# Patient Record
Sex: Female | Born: 1940 | Race: White | Hispanic: No | Marital: Married | Smoking: Former smoker
Health system: Southern US, Community
[De-identification: ages and names within clinical notes are randomized; demographics above are authoritative.]

## PROBLEM LIST (undated history)

## (undated) DIAGNOSIS — Z8719 Personal history of other diseases of the digestive system: Secondary | ICD-10-CM

## (undated) DIAGNOSIS — I5032 Chronic diastolic (congestive) heart failure: Secondary | ICD-10-CM

## (undated) DIAGNOSIS — M810 Age-related osteoporosis without current pathological fracture: Secondary | ICD-10-CM

## (undated) DIAGNOSIS — I10 Essential (primary) hypertension: Secondary | ICD-10-CM

## (undated) DIAGNOSIS — M542 Cervicalgia: Secondary | ICD-10-CM

## (undated) DIAGNOSIS — I350 Nonrheumatic aortic (valve) stenosis: Secondary | ICD-10-CM

## (undated) DIAGNOSIS — F419 Anxiety disorder, unspecified: Secondary | ICD-10-CM

## (undated) DIAGNOSIS — J42 Unspecified chronic bronchitis: Secondary | ICD-10-CM

## (undated) DIAGNOSIS — R413 Other amnesia: Secondary | ICD-10-CM

## (undated) DIAGNOSIS — G4733 Obstructive sleep apnea (adult) (pediatric): Secondary | ICD-10-CM

## (undated) DIAGNOSIS — R296 Repeated falls: Secondary | ICD-10-CM

## (undated) DIAGNOSIS — R002 Palpitations: Secondary | ICD-10-CM

## (undated) DIAGNOSIS — G252 Other specified forms of tremor: Secondary | ICD-10-CM

## (undated) DIAGNOSIS — N390 Urinary tract infection, site not specified: Secondary | ICD-10-CM

## (undated) DIAGNOSIS — D126 Benign neoplasm of colon, unspecified: Secondary | ICD-10-CM

## (undated) DIAGNOSIS — M1990 Unspecified osteoarthritis, unspecified site: Secondary | ICD-10-CM

## (undated) DIAGNOSIS — K766 Portal hypertension: Secondary | ICD-10-CM

## (undated) DIAGNOSIS — K449 Diaphragmatic hernia without obstruction or gangrene: Secondary | ICD-10-CM

## (undated) DIAGNOSIS — E785 Hyperlipidemia, unspecified: Secondary | ICD-10-CM

## (undated) DIAGNOSIS — J4489 Other specified chronic obstructive pulmonary disease: Secondary | ICD-10-CM

## (undated) DIAGNOSIS — K7581 Nonalcoholic steatohepatitis (NASH): Secondary | ICD-10-CM

## (undated) DIAGNOSIS — I2510 Atherosclerotic heart disease of native coronary artery without angina pectoris: Secondary | ICD-10-CM

## (undated) DIAGNOSIS — F209 Schizophrenia, unspecified: Secondary | ICD-10-CM

## (undated) DIAGNOSIS — K759 Inflammatory liver disease, unspecified: Secondary | ICD-10-CM

## (undated) DIAGNOSIS — K5730 Diverticulosis of large intestine without perforation or abscess without bleeding: Secondary | ICD-10-CM

## (undated) DIAGNOSIS — K3189 Other diseases of stomach and duodenum: Secondary | ICD-10-CM

## (undated) DIAGNOSIS — G5603 Carpal tunnel syndrome, bilateral upper limbs: Secondary | ICD-10-CM

## (undated) DIAGNOSIS — E46 Unspecified protein-calorie malnutrition: Secondary | ICD-10-CM

## (undated) DIAGNOSIS — K219 Gastro-esophageal reflux disease without esophagitis: Secondary | ICD-10-CM

## (undated) DIAGNOSIS — R0789 Other chest pain: Secondary | ICD-10-CM

## (undated) DIAGNOSIS — M549 Dorsalgia, unspecified: Secondary | ICD-10-CM

## (undated) DIAGNOSIS — Z9981 Dependence on supplemental oxygen: Secondary | ICD-10-CM

## (undated) DIAGNOSIS — E119 Type 2 diabetes mellitus without complications: Secondary | ICD-10-CM

## (undated) DIAGNOSIS — F333 Major depressive disorder, recurrent, severe with psychotic symptoms: Secondary | ICD-10-CM

## (undated) DIAGNOSIS — G2581 Restless legs syndrome: Secondary | ICD-10-CM

## (undated) DIAGNOSIS — D696 Thrombocytopenia, unspecified: Secondary | ICD-10-CM

## (undated) DIAGNOSIS — K31819 Angiodysplasia of stomach and duodenum without bleeding: Secondary | ICD-10-CM

## (undated) DIAGNOSIS — K7460 Unspecified cirrhosis of liver: Secondary | ICD-10-CM

## (undated) DIAGNOSIS — D500 Iron deficiency anemia secondary to blood loss (chronic): Secondary | ICD-10-CM

## (undated) DIAGNOSIS — J189 Pneumonia, unspecified organism: Secondary | ICD-10-CM

## (undated) DIAGNOSIS — J449 Chronic obstructive pulmonary disease, unspecified: Secondary | ICD-10-CM

## (undated) HISTORY — DX: Angiodysplasia of stomach and duodenum without bleeding: K31.819

## (undated) HISTORY — DX: Unspecified cirrhosis of liver: K74.60

## (undated) HISTORY — DX: Urinary tract infection, site not specified: N39.0

## (undated) HISTORY — DX: Major depressive disorder, recurrent, severe with psychotic symptoms: F33.3

## (undated) HISTORY — DX: Other amnesia: R41.3

## (undated) HISTORY — DX: Palpitations: R00.2

## (undated) HISTORY — PX: HEMORROIDECTOMY: SUR656

## (undated) HISTORY — DX: Restless legs syndrome: G25.81

## (undated) HISTORY — DX: Diverticulosis of large intestine without perforation or abscess without bleeding: K57.30

## (undated) HISTORY — DX: Dorsalgia, unspecified: M54.9

## (undated) HISTORY — DX: Repeated falls: R29.6

## (undated) HISTORY — PX: BREAST BIOPSY: SHX20

## (undated) HISTORY — DX: Unspecified protein-calorie malnutrition: E46

## (undated) HISTORY — DX: Iron deficiency anemia secondary to blood loss (chronic): D50.0

## (undated) HISTORY — DX: Diaphragmatic hernia without obstruction or gangrene: K44.9

## (undated) HISTORY — DX: Benign neoplasm of colon, unspecified: D12.6

## (undated) HISTORY — DX: Nonalcoholic steatohepatitis (NASH): K75.81

## (undated) HISTORY — DX: Other specified chronic obstructive pulmonary disease: J44.89

## (undated) HISTORY — DX: Gastro-esophageal reflux disease without esophagitis: K21.9

## (undated) HISTORY — DX: Atherosclerotic heart disease of native coronary artery without angina pectoris: I25.10

## (undated) HISTORY — DX: Chronic obstructive pulmonary disease, unspecified: J44.9

## (undated) HISTORY — DX: Cervicalgia: M54.2

## (undated) HISTORY — DX: Age-related osteoporosis without current pathological fracture: M81.0

## (undated) HISTORY — DX: Unspecified osteoarthritis, unspecified site: M19.90

## (undated) HISTORY — DX: Other diseases of stomach and duodenum: K31.89

## (undated) HISTORY — DX: Portal hypertension: K76.6

## (undated) HISTORY — DX: Morbid (severe) obesity due to excess calories: E66.01

## (undated) HISTORY — PX: VAGINAL HYSTERECTOMY: SUR661

## (undated) HISTORY — PX: APPENDECTOMY: SHX54

## (undated) HISTORY — DX: Essential (primary) hypertension: I10

## (undated) HISTORY — DX: Unspecified chronic bronchitis: J42

---

## 1999-03-26 ENCOUNTER — Emergency Department (HOSPITAL_COMMUNITY): Admission: EM | Admit: 1999-03-26 | Discharge: 1999-03-26 | Payer: Self-pay | Admitting: Emergency Medicine

## 1999-04-19 ENCOUNTER — Inpatient Hospital Stay (HOSPITAL_COMMUNITY): Admission: EM | Admit: 1999-04-19 | Discharge: 1999-04-24 | Payer: Self-pay | Admitting: Emergency Medicine

## 1999-04-25 ENCOUNTER — Other Ambulatory Visit: Admission: RE | Admit: 1999-04-25 | Discharge: 1999-05-03 | Payer: Self-pay

## 1999-05-08 ENCOUNTER — Encounter (HOSPITAL_COMMUNITY): Admission: RE | Admit: 1999-05-08 | Discharge: 1999-08-06 | Payer: Self-pay | Admitting: Pulmonary Disease

## 1999-10-18 ENCOUNTER — Other Ambulatory Visit: Admission: RE | Admit: 1999-10-18 | Discharge: 1999-10-18 | Payer: Self-pay | Admitting: *Deleted

## 1999-12-11 ENCOUNTER — Encounter (INDEPENDENT_AMBULATORY_CARE_PROVIDER_SITE_OTHER): Payer: Self-pay | Admitting: Specialist

## 1999-12-11 ENCOUNTER — Inpatient Hospital Stay (HOSPITAL_COMMUNITY): Admission: EM | Admit: 1999-12-11 | Discharge: 1999-12-15 | Payer: Self-pay | Admitting: Gastroenterology

## 1999-12-12 ENCOUNTER — Encounter: Payer: Self-pay | Admitting: Gastroenterology

## 2000-01-16 ENCOUNTER — Encounter (INDEPENDENT_AMBULATORY_CARE_PROVIDER_SITE_OTHER): Payer: Self-pay | Admitting: Specialist

## 2000-01-16 ENCOUNTER — Ambulatory Visit (HOSPITAL_COMMUNITY): Admission: RE | Admit: 2000-01-16 | Discharge: 2000-01-16 | Payer: Self-pay | Admitting: Internal Medicine

## 2000-07-22 ENCOUNTER — Ambulatory Visit (HOSPITAL_COMMUNITY): Admission: RE | Admit: 2000-07-22 | Discharge: 2000-07-22 | Payer: Self-pay | Admitting: Internal Medicine

## 2000-07-22 ENCOUNTER — Encounter: Payer: Self-pay | Admitting: Internal Medicine

## 2000-08-01 ENCOUNTER — Ambulatory Visit (HOSPITAL_COMMUNITY): Admission: RE | Admit: 2000-08-01 | Discharge: 2000-08-01 | Payer: Self-pay | Admitting: Cardiology

## 2000-08-12 ENCOUNTER — Emergency Department (HOSPITAL_COMMUNITY): Admission: EM | Admit: 2000-08-12 | Discharge: 2000-08-13 | Payer: Self-pay | Admitting: Emergency Medicine

## 2000-08-12 ENCOUNTER — Encounter: Payer: Self-pay | Admitting: Emergency Medicine

## 2000-09-16 ENCOUNTER — Ambulatory Visit (HOSPITAL_COMMUNITY): Admission: RE | Admit: 2000-09-16 | Discharge: 2000-09-16 | Payer: Self-pay | Admitting: Pulmonary Disease

## 2000-09-16 ENCOUNTER — Encounter: Payer: Self-pay | Admitting: Pulmonary Disease

## 2000-09-18 ENCOUNTER — Ambulatory Visit: Admission: RE | Admit: 2000-09-18 | Discharge: 2000-09-18 | Payer: Self-pay | Admitting: Pulmonary Disease

## 2000-09-23 ENCOUNTER — Encounter (HOSPITAL_COMMUNITY): Admission: RE | Admit: 2000-09-23 | Discharge: 2000-12-22 | Payer: Self-pay | Admitting: Internal Medicine

## 2001-06-28 ENCOUNTER — Emergency Department (HOSPITAL_COMMUNITY): Admission: EM | Admit: 2001-06-28 | Discharge: 2001-06-28 | Payer: Self-pay

## 2001-08-16 ENCOUNTER — Encounter: Payer: Self-pay | Admitting: Emergency Medicine

## 2001-08-16 ENCOUNTER — Emergency Department (HOSPITAL_COMMUNITY): Admission: EM | Admit: 2001-08-16 | Discharge: 2001-08-16 | Payer: Self-pay | Admitting: Emergency Medicine

## 2001-10-27 ENCOUNTER — Encounter (INDEPENDENT_AMBULATORY_CARE_PROVIDER_SITE_OTHER): Payer: Self-pay | Admitting: *Deleted

## 2001-10-27 ENCOUNTER — Inpatient Hospital Stay (HOSPITAL_COMMUNITY): Admission: EM | Admit: 2001-10-27 | Discharge: 2001-11-01 | Payer: Self-pay | Admitting: Emergency Medicine

## 2001-10-27 ENCOUNTER — Encounter: Payer: Self-pay | Admitting: Emergency Medicine

## 2001-12-01 ENCOUNTER — Encounter: Payer: Self-pay | Admitting: Internal Medicine

## 2001-12-01 ENCOUNTER — Ambulatory Visit (HOSPITAL_COMMUNITY): Admission: RE | Admit: 2001-12-01 | Discharge: 2001-12-01 | Payer: Self-pay | Admitting: Internal Medicine

## 2001-12-01 ENCOUNTER — Encounter (INDEPENDENT_AMBULATORY_CARE_PROVIDER_SITE_OTHER): Payer: Self-pay | Admitting: Specialist

## 2001-12-08 ENCOUNTER — Ambulatory Visit (HOSPITAL_COMMUNITY): Admission: RE | Admit: 2001-12-08 | Discharge: 2001-12-08 | Payer: Self-pay | Admitting: Internal Medicine

## 2002-03-03 ENCOUNTER — Emergency Department (HOSPITAL_COMMUNITY): Admission: EM | Admit: 2002-03-03 | Discharge: 2002-03-03 | Payer: Self-pay | Admitting: Emergency Medicine

## 2002-03-03 ENCOUNTER — Ambulatory Visit (HOSPITAL_COMMUNITY): Admission: RE | Admit: 2002-03-03 | Discharge: 2002-03-03 | Payer: Self-pay | Admitting: Internal Medicine

## 2002-08-08 ENCOUNTER — Emergency Department (HOSPITAL_COMMUNITY): Admission: EM | Admit: 2002-08-08 | Discharge: 2002-08-09 | Payer: Self-pay

## 2002-08-09 ENCOUNTER — Encounter: Payer: Self-pay | Admitting: Emergency Medicine

## 2002-09-09 ENCOUNTER — Emergency Department (HOSPITAL_COMMUNITY): Admission: EM | Admit: 2002-09-09 | Discharge: 2002-09-10 | Payer: Self-pay | Admitting: Emergency Medicine

## 2003-07-27 ENCOUNTER — Encounter (HOSPITAL_COMMUNITY): Admission: RE | Admit: 2003-07-27 | Discharge: 2003-10-25 | Payer: Self-pay | Admitting: Internal Medicine

## 2003-11-08 ENCOUNTER — Emergency Department (HOSPITAL_COMMUNITY): Admission: EM | Admit: 2003-11-08 | Discharge: 2003-11-09 | Payer: Self-pay | Admitting: Emergency Medicine

## 2003-11-28 ENCOUNTER — Inpatient Hospital Stay (HOSPITAL_COMMUNITY): Admission: AD | Admit: 2003-11-28 | Discharge: 2003-12-05 | Payer: Self-pay | Admitting: Pulmonary Disease

## 2004-04-16 ENCOUNTER — Emergency Department (HOSPITAL_COMMUNITY): Admission: EM | Admit: 2004-04-16 | Discharge: 2004-04-17 | Payer: Self-pay | Admitting: Emergency Medicine

## 2004-04-19 ENCOUNTER — Other Ambulatory Visit (HOSPITAL_COMMUNITY): Admission: RE | Admit: 2004-04-19 | Discharge: 2004-07-18 | Payer: Self-pay | Admitting: Psychiatry

## 2004-04-25 ENCOUNTER — Encounter: Admission: RE | Admit: 2004-04-25 | Discharge: 2004-04-25 | Payer: Self-pay | Admitting: Internal Medicine

## 2004-05-04 ENCOUNTER — Ambulatory Visit (HOSPITAL_COMMUNITY): Payer: Self-pay | Admitting: Professional Counselor

## 2004-05-17 ENCOUNTER — Ambulatory Visit (HOSPITAL_COMMUNITY): Payer: Self-pay | Admitting: Professional Counselor

## 2004-05-30 ENCOUNTER — Ambulatory Visit (HOSPITAL_COMMUNITY): Payer: Self-pay | Admitting: Professional Counselor

## 2004-06-25 ENCOUNTER — Ambulatory Visit (HOSPITAL_COMMUNITY): Payer: Self-pay | Admitting: Professional Counselor

## 2004-06-26 ENCOUNTER — Ambulatory Visit: Payer: Self-pay | Admitting: Internal Medicine

## 2004-06-27 ENCOUNTER — Ambulatory Visit: Payer: Self-pay | Admitting: Pulmonary Disease

## 2004-07-04 ENCOUNTER — Ambulatory Visit: Payer: Self-pay | Admitting: Pulmonary Disease

## 2004-08-24 ENCOUNTER — Ambulatory Visit: Payer: Self-pay | Admitting: Internal Medicine

## 2004-10-19 ENCOUNTER — Ambulatory Visit: Payer: Self-pay | Admitting: Internal Medicine

## 2004-10-19 ENCOUNTER — Ambulatory Visit (HOSPITAL_COMMUNITY): Admission: RE | Admit: 2004-10-19 | Discharge: 2004-10-19 | Payer: Self-pay | Admitting: Internal Medicine

## 2004-11-05 ENCOUNTER — Ambulatory Visit: Payer: Self-pay | Admitting: Pulmonary Disease

## 2004-11-23 ENCOUNTER — Ambulatory Visit: Payer: Self-pay | Admitting: Pulmonary Disease

## 2005-01-08 ENCOUNTER — Ambulatory Visit: Payer: Self-pay | Admitting: Internal Medicine

## 2005-01-11 ENCOUNTER — Ambulatory Visit: Payer: Self-pay | Admitting: Cardiovascular Disease

## 2005-01-16 ENCOUNTER — Ambulatory Visit: Payer: Self-pay | Admitting: Pulmonary Disease

## 2005-02-04 ENCOUNTER — Ambulatory Visit: Payer: Self-pay | Admitting: Pulmonary Disease

## 2005-02-19 ENCOUNTER — Encounter (HOSPITAL_COMMUNITY): Admission: RE | Admit: 2005-02-19 | Discharge: 2005-02-21 | Payer: Self-pay | Admitting: Pulmonary Disease

## 2005-06-04 ENCOUNTER — Ambulatory Visit: Payer: Self-pay | Admitting: Pulmonary Disease

## 2005-06-13 ENCOUNTER — Ambulatory Visit: Payer: Self-pay | Admitting: Internal Medicine

## 2005-06-26 ENCOUNTER — Ambulatory Visit: Payer: Self-pay

## 2005-07-02 ENCOUNTER — Ambulatory Visit (HOSPITAL_COMMUNITY): Admission: RE | Admit: 2005-07-02 | Discharge: 2005-07-02 | Payer: Self-pay | Admitting: Internal Medicine

## 2005-07-12 ENCOUNTER — Emergency Department (HOSPITAL_COMMUNITY): Admission: EM | Admit: 2005-07-12 | Discharge: 2005-07-12 | Payer: Self-pay | Admitting: Emergency Medicine

## 2005-07-30 ENCOUNTER — Ambulatory Visit: Payer: Self-pay | Admitting: Internal Medicine

## 2005-09-05 ENCOUNTER — Ambulatory Visit: Payer: Self-pay | Admitting: Pulmonary Disease

## 2005-09-24 ENCOUNTER — Ambulatory Visit: Payer: Self-pay | Admitting: Pulmonary Disease

## 2005-09-24 ENCOUNTER — Inpatient Hospital Stay (HOSPITAL_COMMUNITY): Admission: AD | Admit: 2005-09-24 | Discharge: 2005-09-27 | Payer: Self-pay | Admitting: Pulmonary Disease

## 2005-10-01 ENCOUNTER — Ambulatory Visit: Payer: Self-pay | Admitting: Pulmonary Disease

## 2005-12-16 ENCOUNTER — Ambulatory Visit: Payer: Self-pay | Admitting: Pulmonary Disease

## 2005-12-24 ENCOUNTER — Ambulatory Visit: Payer: Self-pay | Admitting: Internal Medicine

## 2006-01-06 ENCOUNTER — Ambulatory Visit: Payer: Self-pay | Admitting: Internal Medicine

## 2006-03-25 ENCOUNTER — Ambulatory Visit: Payer: Self-pay | Admitting: Pulmonary Disease

## 2006-04-24 ENCOUNTER — Ambulatory Visit: Payer: Self-pay | Admitting: Internal Medicine

## 2006-05-02 ENCOUNTER — Encounter (HOSPITAL_COMMUNITY): Admission: RE | Admit: 2006-05-02 | Discharge: 2006-07-31 | Payer: Self-pay | Admitting: Internal Medicine

## 2006-06-23 ENCOUNTER — Ambulatory Visit: Payer: Self-pay | Admitting: Pulmonary Disease

## 2006-07-07 ENCOUNTER — Ambulatory Visit: Payer: Self-pay | Admitting: Internal Medicine

## 2006-09-04 ENCOUNTER — Ambulatory Visit: Payer: Self-pay | Admitting: Internal Medicine

## 2006-09-04 LAB — CONVERTED CEMR LAB
Albumin: 3.2 g/dL — ABNORMAL LOW (ref 3.5–5.2)
BUN: 11 mg/dL (ref 6–23)
CO2: 28 meq/L (ref 19–32)
Chloride: 101 meq/L (ref 96–112)
GFR calc non Af Amer: 77 mL/min
Glomerular Filtration Rate, Af Am: 93 mL/min/{1.73_m2}
HCT: 33.2 % — ABNORMAL LOW (ref 36.0–46.0)
MCHC: 33.6 g/dL (ref 30.0–36.0)
MCV: 97.3 fL (ref 78.0–100.0)
Platelets: 130 10*3/uL — ABNORMAL LOW (ref 150–400)
RDW: 14 % (ref 11.5–14.6)
Transferrin: 261.1 mg/dL (ref >=212.0)

## 2006-09-24 ENCOUNTER — Encounter: Admission: RE | Admit: 2006-09-24 | Discharge: 2006-12-23 | Payer: Self-pay | Admitting: Internal Medicine

## 2006-10-07 ENCOUNTER — Emergency Department (HOSPITAL_COMMUNITY): Admission: EM | Admit: 2006-10-07 | Discharge: 2006-10-07 | Payer: Self-pay | Admitting: Emergency Medicine

## 2006-10-15 ENCOUNTER — Ambulatory Visit: Payer: Self-pay | Admitting: Pulmonary Disease

## 2006-10-15 LAB — CONVERTED CEMR LAB
Albumin: 3.5 g/dL (ref 3.5–5.2)
BUN: 13 mg/dL (ref 6–23)
CO2: 29 meq/L (ref 19–32)
Creatinine, Ser: 0.6 mg/dL (ref 0.4–1.2)
Creatinine,U: 64.4 mg/dL
Eosinophils Absolute: 0.2 10*3/uL (ref 0.0–0.6)
GFR calc Af Amer: 130 mL/min
GFR calc non Af Amer: 107 mL/min
Iron: 89 ug/dL (ref 42–145)
Lymphocytes Relative: 18.3 % (ref 12.0–46.0)
MCHC: 34.2 g/dL (ref 30.0–36.0)
Microalb Creat Ratio: 4.7 mg/g (ref 0.0–30.0)
Monocytes Relative: 10.6 % (ref 3.0–11.0)
Phosphorus: 3.7 mg/dL (ref 2.3–4.6)
Platelets: 139 10*3/uL — ABNORMAL LOW (ref 150–400)
Potassium: 3.4 meq/L — ABNORMAL LOW (ref 3.5–5.1)
RBC: 3.71 M/uL — ABNORMAL LOW (ref 3.87–5.11)
Transferrin: 252.2 mg/dL (ref >=212.0)

## 2006-10-16 ENCOUNTER — Ambulatory Visit: Payer: Self-pay | Admitting: Internal Medicine

## 2006-10-19 ENCOUNTER — Encounter: Admission: RE | Admit: 2006-10-19 | Discharge: 2006-10-19 | Payer: Self-pay | Admitting: Pulmonary Disease

## 2006-11-12 ENCOUNTER — Ambulatory Visit: Payer: Self-pay | Admitting: Pulmonary Disease

## 2006-12-26 ENCOUNTER — Ambulatory Visit: Payer: Self-pay | Admitting: Pulmonary Disease

## 2007-01-01 ENCOUNTER — Ambulatory Visit: Payer: Self-pay | Admitting: Internal Medicine

## 2007-01-01 LAB — CONVERTED CEMR LAB
Basophils Relative: 0.4 % (ref 0.0–1.0)
Eosinophils Absolute: 0.2 10*3/uL (ref 0.0–0.6)
HCT: 35.2 % — ABNORMAL LOW (ref 36.0–46.0)
MCHC: 33.8 g/dL (ref 30.0–36.0)
Neutrophils Relative %: 72 % (ref 43.0–77.0)
Platelets: 131 10*3/uL — ABNORMAL LOW (ref 150–400)
WBC: 5.2 10*3/uL (ref 4.5–10.5)

## 2007-01-05 ENCOUNTER — Ambulatory Visit: Payer: Self-pay | Admitting: Internal Medicine

## 2007-02-26 ENCOUNTER — Ambulatory Visit: Payer: Self-pay | Admitting: Pulmonary Disease

## 2007-02-26 ENCOUNTER — Ambulatory Visit: Payer: Self-pay | Admitting: Internal Medicine

## 2007-02-26 LAB — CONVERTED CEMR LAB
ALT: 27 units/L (ref 0–35)
Albumin: 3.6 g/dL (ref 3.5–5.2)
Alkaline Phosphatase: 138 units/L — ABNORMAL HIGH (ref 39–117)
BUN: 13 mg/dL (ref 6–23)
Basophils Absolute: 0 10*3/uL (ref 0.0–0.1)
Basophils Relative: 0.4 % (ref 0.0–1.0)
Eosinophils Relative: 4.1 % (ref 0.0–5.0)
GFR calc non Af Amer: 90 mL/min
Glucose, Bld: 244 mg/dL — ABNORMAL HIGH (ref 70–99)
Lymphocytes Relative: 11.7 % — ABNORMAL LOW (ref 12.0–46.0)
Monocytes Relative: 6.8 % (ref 3.0–11.0)
Neutro Abs: 4.9 10*3/uL (ref 1.4–7.7)
Platelets: 151 10*3/uL (ref 150–400)
Potassium: 3.8 meq/L (ref 3.5–5.1)
Sodium: 139 meq/L (ref 135–145)
TSH: 1.04 microintl units/mL (ref 0.35–5.50)
Total Bilirubin: 1.2 mg/dL (ref 0.3–1.2)
Total Protein: 6.9 g/dL (ref 6.0–8.3)
Transferrin: 313 mg/dL (ref >=212.0)

## 2007-06-01 ENCOUNTER — Ambulatory Visit: Payer: Self-pay | Admitting: Pulmonary Disease

## 2007-06-01 LAB — CONVERTED CEMR LAB
ALT: 20 units/L (ref 0–35)
AST: 26 units/L (ref 0–37)
Alkaline Phosphatase: 106 units/L (ref 39–117)
BUN: 7 mg/dL (ref 6–23)
Bilirubin, Direct: 0.3 mg/dL (ref 0.0–0.3)
Creatinine, Ser: 0.7 mg/dL (ref 0.4–1.2)
GFR calc non Af Amer: 90 mL/min
Glucose, Bld: 198 mg/dL — ABNORMAL HIGH (ref 70–99)
HCT: 34.1 % — ABNORMAL LOW (ref 36.0–46.0)
Hemoglobin: 11.4 g/dL — ABNORMAL LOW (ref 12.0–15.0)
Lymphocytes Relative: 15.1 % (ref 12.0–46.0)
Monocytes Relative: 9.1 % (ref 3.0–11.0)
Neutro Abs: 3.8 10*3/uL (ref 1.4–7.7)
Potassium: 3.8 meq/L (ref 3.5–5.1)
RDW: 13.7 % (ref 11.5–14.6)
Sodium: 140 meq/L (ref 135–145)

## 2007-06-02 ENCOUNTER — Ambulatory Visit: Payer: Self-pay | Admitting: Cardiovascular Disease

## 2007-06-09 ENCOUNTER — Ambulatory Visit: Payer: Self-pay | Admitting: Internal Medicine

## 2007-06-09 ENCOUNTER — Encounter: Payer: Self-pay | Admitting: Internal Medicine

## 2007-06-12 ENCOUNTER — Ambulatory Visit: Payer: Self-pay | Admitting: Internal Medicine

## 2007-06-12 ENCOUNTER — Ambulatory Visit: Payer: Self-pay | Admitting: Pulmonary Disease

## 2007-06-12 LAB — CONVERTED CEMR LAB
Bilirubin Urine: NEGATIVE
Hemoglobin, Urine: NEGATIVE
RBC / HPF: NONE SEEN
Specific Gravity, Urine: 1.01 (ref 1.000–1.03)
Total Protein, Urine: NEGATIVE mg/dL

## 2007-06-25 DIAGNOSIS — J441 Chronic obstructive pulmonary disease with (acute) exacerbation: Secondary | ICD-10-CM | POA: Insufficient documentation

## 2007-06-25 DIAGNOSIS — M797 Fibromyalgia: Secondary | ICD-10-CM

## 2007-06-25 DIAGNOSIS — K5520 Angiodysplasia of colon without hemorrhage: Secondary | ICD-10-CM

## 2007-06-25 DIAGNOSIS — I10 Essential (primary) hypertension: Secondary | ICD-10-CM | POA: Insufficient documentation

## 2007-06-25 DIAGNOSIS — K219 Gastro-esophageal reflux disease without esophagitis: Secondary | ICD-10-CM | POA: Insufficient documentation

## 2007-06-26 ENCOUNTER — Ambulatory Visit: Payer: Self-pay | Admitting: Internal Medicine

## 2007-07-01 ENCOUNTER — Ambulatory Visit: Payer: Self-pay | Admitting: Pulmonary Disease

## 2007-07-01 LAB — CONVERTED CEMR LAB
Crystals: NEGATIVE
Hemoglobin, Urine: NEGATIVE
Ketones, ur: NEGATIVE mg/dL
Mucus, UA: NEGATIVE
Urobilinogen, UA: 1 (ref 0.0–1.0)
pH: 6 (ref 5.0–8.0)

## 2007-07-14 ENCOUNTER — Telehealth (INDEPENDENT_AMBULATORY_CARE_PROVIDER_SITE_OTHER): Payer: Self-pay | Admitting: *Deleted

## 2007-07-17 ENCOUNTER — Ambulatory Visit: Payer: Self-pay | Admitting: Pulmonary Disease

## 2007-08-24 ENCOUNTER — Telehealth (INDEPENDENT_AMBULATORY_CARE_PROVIDER_SITE_OTHER): Payer: Self-pay | Admitting: *Deleted

## 2007-08-28 ENCOUNTER — Ambulatory Visit: Payer: Self-pay | Admitting: Pulmonary Disease

## 2007-08-28 DIAGNOSIS — I359 Nonrheumatic aortic valve disorder, unspecified: Secondary | ICD-10-CM | POA: Insufficient documentation

## 2007-08-28 DIAGNOSIS — M1990 Unspecified osteoarthritis, unspecified site: Secondary | ICD-10-CM

## 2007-08-28 DIAGNOSIS — D500 Iron deficiency anemia secondary to blood loss (chronic): Secondary | ICD-10-CM

## 2007-08-28 DIAGNOSIS — K754 Autoimmune hepatitis: Secondary | ICD-10-CM

## 2007-08-28 DIAGNOSIS — M810 Age-related osteoporosis without current pathological fracture: Secondary | ICD-10-CM

## 2007-08-28 LAB — CONVERTED CEMR LAB
ALT: 18 units/L (ref 0–35)
AST: 23 units/L (ref 0–37)
Basophils Relative: 0 % (ref 0.0–1.0)
Bilirubin, Direct: 0.4 mg/dL — ABNORMAL HIGH (ref 0.0–0.3)
CO2: 30 meq/L (ref 19–32)
Calcium: 9.2 mg/dL (ref 8.4–10.5)
Creatinine, Ser: 0.7 mg/dL (ref 0.4–1.2)
Eosinophils Absolute: 0.2 10*3/uL (ref 0.0–0.6)
Eosinophils Relative: 2.9 % (ref 0.0–5.0)
Glucose, Bld: 119 mg/dL — ABNORMAL HIGH (ref 70–99)
Hgb A1c MFr Bld: 6 % (ref 4.6–6.0)
Monocytes Absolute: 0.4 10*3/uL (ref 0.2–0.7)
Monocytes Relative: 6.8 % (ref 3.0–11.0)
Neutro Abs: 4.3 10*3/uL (ref 1.4–7.7)
Neutrophils Relative %: 78.6 % — ABNORMAL HIGH (ref 43.0–77.0)
RBC: 3.33 M/uL — ABNORMAL LOW (ref 3.87–5.11)
Sodium: 140 meq/L (ref 135–145)
Total Protein: 7 g/dL (ref 6.0–8.3)

## 2007-09-04 ENCOUNTER — Encounter: Payer: Self-pay | Admitting: Pulmonary Disease

## 2007-09-21 ENCOUNTER — Ambulatory Visit: Payer: Self-pay

## 2007-09-21 ENCOUNTER — Encounter: Payer: Self-pay | Admitting: Pulmonary Disease

## 2007-10-02 ENCOUNTER — Telehealth: Payer: Self-pay | Admitting: Pulmonary Disease

## 2007-10-08 ENCOUNTER — Emergency Department (HOSPITAL_COMMUNITY): Admission: EM | Admit: 2007-10-08 | Discharge: 2007-10-08 | Payer: Self-pay | Admitting: Family Medicine

## 2007-10-15 ENCOUNTER — Telehealth (INDEPENDENT_AMBULATORY_CARE_PROVIDER_SITE_OTHER): Payer: Self-pay | Admitting: *Deleted

## 2007-10-19 ENCOUNTER — Telehealth (INDEPENDENT_AMBULATORY_CARE_PROVIDER_SITE_OTHER): Payer: Self-pay | Admitting: *Deleted

## 2007-10-24 ENCOUNTER — Encounter: Payer: Self-pay | Admitting: Pulmonary Disease

## 2007-10-29 ENCOUNTER — Ambulatory Visit: Payer: Self-pay | Admitting: Pulmonary Disease

## 2007-11-09 ENCOUNTER — Ambulatory Visit: Payer: Self-pay | Admitting: Pulmonary Disease

## 2007-11-09 DIAGNOSIS — F333 Major depressive disorder, recurrent, severe with psychotic symptoms: Secondary | ICD-10-CM

## 2007-11-14 LAB — CONVERTED CEMR LAB: Vit D, 1,25-Dihydroxy: 15 — ABNORMAL LOW (ref 30–89)

## 2007-11-17 ENCOUNTER — Telehealth (INDEPENDENT_AMBULATORY_CARE_PROVIDER_SITE_OTHER): Payer: Self-pay | Admitting: *Deleted

## 2007-11-18 ENCOUNTER — Telehealth (INDEPENDENT_AMBULATORY_CARE_PROVIDER_SITE_OTHER): Payer: Self-pay | Admitting: *Deleted

## 2007-11-18 ENCOUNTER — Ambulatory Visit: Payer: Self-pay | Admitting: Pulmonary Disease

## 2007-11-18 LAB — CONVERTED CEMR LAB
Albumin ELP: 55.3 % — ABNORMAL LOW (ref 55.8–66.1)
Alpha-1-Globulin: 4.8 % (ref 2.9–4.9)
Alpha-2-Globulin: 9.6 % (ref 7.1–11.8)
Beta Globulin: 7.9 % — ABNORMAL HIGH (ref 4.7–7.2)
Gamma Globulin: 15.1 % (ref 11.1–18.8)
IgG (Immunoglobin G), Serum: 1070 mg/dL (ref 694–1618)
Total Protein, Serum Electrophoresis: 6.8 g/dL (ref 6.0–8.3)

## 2007-11-19 ENCOUNTER — Encounter: Payer: Self-pay | Admitting: Pulmonary Disease

## 2007-11-19 LAB — CONVERTED CEMR LAB
Basophils Absolute: 0 10*3/uL (ref 0.0–0.1)
HCT: 27 % — ABNORMAL LOW (ref 36.0–46.0)
Hemoglobin: 8.8 g/dL — ABNORMAL LOW (ref 12.0–15.0)
MCHC: 32.7 g/dL (ref 30.0–36.0)
RDW: 15.6 % — ABNORMAL HIGH (ref 11.5–14.6)
Saturation Ratios: 10.5 % — ABNORMAL LOW (ref 20.0–50.0)
Vitamin B-12: 429 pg/mL (ref 211–911)

## 2007-11-23 ENCOUNTER — Ambulatory Visit (HOSPITAL_COMMUNITY): Admission: RE | Admit: 2007-11-23 | Discharge: 2007-11-23 | Payer: Self-pay | Admitting: Pulmonary Disease

## 2007-11-26 ENCOUNTER — Ambulatory Visit: Payer: Self-pay | Admitting: Pulmonary Disease

## 2007-11-26 LAB — CONVERTED CEMR LAB
Fecal Occult Blood: NEGATIVE
OCCULT 1: NEGATIVE
OCCULT 3: NEGATIVE

## 2007-11-27 ENCOUNTER — Inpatient Hospital Stay (HOSPITAL_COMMUNITY): Admission: EM | Admit: 2007-11-27 | Discharge: 2007-12-02 | Payer: Self-pay | Admitting: Emergency Medicine

## 2007-11-27 ENCOUNTER — Ambulatory Visit: Payer: Self-pay | Admitting: Internal Medicine

## 2007-11-27 ENCOUNTER — Ambulatory Visit: Payer: Self-pay | Admitting: Pulmonary Disease

## 2007-12-02 ENCOUNTER — Encounter: Payer: Self-pay | Admitting: Pulmonary Disease

## 2007-12-14 ENCOUNTER — Ambulatory Visit: Payer: Self-pay | Admitting: Pulmonary Disease

## 2007-12-14 LAB — CONVERTED CEMR LAB
Basophils Absolute: 0.1 10*3/uL (ref 0.0–0.1)
Eosinophils Absolute: 0.2 10*3/uL (ref 0.0–0.7)
HCT: 29 % — ABNORMAL LOW (ref 36.0–46.0)
Hemoglobin: 9.7 g/dL — ABNORMAL LOW (ref 12.0–15.0)
Iron: 75 ug/dL (ref 42–145)
Lymphocytes Relative: 13.1 % (ref 12.0–46.0)
Monocytes Relative: 8.5 % (ref 3.0–12.0)
Platelets: 138 10*3/uL — ABNORMAL LOW (ref 150–400)
RBC: 2.93 M/uL — ABNORMAL LOW (ref 3.87–5.11)
RDW: 17.7 % — ABNORMAL HIGH (ref 11.5–14.6)
Saturation Ratios: 21.4 % (ref 20.0–50.0)

## 2007-12-21 ENCOUNTER — Ambulatory Visit: Payer: Self-pay | Admitting: Pulmonary Disease

## 2007-12-21 ENCOUNTER — Encounter: Payer: Self-pay | Admitting: Adult Health

## 2007-12-21 ENCOUNTER — Telehealth: Payer: Self-pay | Admitting: Pulmonary Disease

## 2007-12-21 LAB — CONVERTED CEMR LAB
Crystals: NEGATIVE
Hemoglobin, Urine: NEGATIVE
Nitrite: NEGATIVE
RBC / HPF: NONE SEEN
Total Protein, Urine: NEGATIVE mg/dL
pH: 6 (ref 5.0–8.0)

## 2007-12-23 ENCOUNTER — Encounter: Payer: Self-pay | Admitting: Pulmonary Disease

## 2007-12-24 ENCOUNTER — Encounter: Payer: Self-pay | Admitting: Pulmonary Disease

## 2007-12-25 ENCOUNTER — Ambulatory Visit: Payer: Self-pay | Admitting: Pulmonary Disease

## 2007-12-25 LAB — CONVERTED CEMR LAB
HCT: 28.8 % — ABNORMAL LOW (ref 36.0–46.0)
Hemoglobin: 9.4 g/dL — ABNORMAL LOW (ref 12.0–15.0)
Iron: 60 ug/dL (ref 42–145)
MCHC: 32.7 g/dL (ref 30.0–36.0)
Monocytes Absolute: 0.3 10*3/uL (ref 0.1–1.0)
Neutrophils Relative %: 73.2 % (ref 43.0–77.0)
RBC: 2.88 M/uL — ABNORMAL LOW (ref 3.87–5.11)
Transferrin: 254.5 mg/dL (ref >=212.0)
WBC: 3.5 10*3/uL — ABNORMAL LOW (ref 4.5–10.5)

## 2008-01-05 ENCOUNTER — Telehealth (INDEPENDENT_AMBULATORY_CARE_PROVIDER_SITE_OTHER): Payer: Self-pay | Admitting: *Deleted

## 2008-01-11 ENCOUNTER — Telehealth (INDEPENDENT_AMBULATORY_CARE_PROVIDER_SITE_OTHER): Payer: Self-pay | Admitting: *Deleted

## 2008-01-11 ENCOUNTER — Encounter: Payer: Self-pay | Admitting: Emergency Medicine

## 2008-01-12 ENCOUNTER — Inpatient Hospital Stay (HOSPITAL_COMMUNITY): Admission: EM | Admit: 2008-01-12 | Discharge: 2008-01-14 | Payer: Self-pay | Admitting: *Deleted

## 2008-01-12 ENCOUNTER — Ambulatory Visit: Payer: Self-pay | Admitting: *Deleted

## 2008-01-19 ENCOUNTER — Encounter: Payer: Self-pay | Admitting: Pulmonary Disease

## 2008-01-19 ENCOUNTER — Telehealth: Payer: Self-pay | Admitting: Pulmonary Disease

## 2008-02-12 ENCOUNTER — Encounter: Payer: Self-pay | Admitting: Adult Health

## 2008-02-12 ENCOUNTER — Ambulatory Visit: Payer: Self-pay | Admitting: Internal Medicine

## 2008-02-12 DIAGNOSIS — I2510 Atherosclerotic heart disease of native coronary artery without angina pectoris: Secondary | ICD-10-CM | POA: Insufficient documentation

## 2008-02-16 ENCOUNTER — Ambulatory Visit: Payer: Self-pay | Admitting: Pulmonary Disease

## 2008-02-16 ENCOUNTER — Telehealth (INDEPENDENT_AMBULATORY_CARE_PROVIDER_SITE_OTHER): Payer: Self-pay | Admitting: *Deleted

## 2008-02-17 ENCOUNTER — Ambulatory Visit: Payer: Self-pay | Admitting: Pulmonary Disease

## 2008-02-17 ENCOUNTER — Telehealth (INDEPENDENT_AMBULATORY_CARE_PROVIDER_SITE_OTHER): Payer: Self-pay | Admitting: *Deleted

## 2008-02-21 LAB — CONVERTED CEMR LAB
Bilirubin Urine: NEGATIVE
Mucus, UA: NEGATIVE
Nitrite: NEGATIVE
Specific Gravity, Urine: 1.01 (ref 1.000–1.03)
pH: 5.5 (ref 5.0–8.0)

## 2008-02-22 ENCOUNTER — Ambulatory Visit: Payer: Self-pay | Admitting: Cardiovascular Disease

## 2008-02-22 LAB — CONVERTED CEMR LAB
ALT: 17 units/L (ref 0–35)
Albumin: 3.5 g/dL (ref 3.5–5.2)
Alkaline Phosphatase: 90 units/L (ref 39–117)
BUN: 20 mg/dL (ref 6–23)
Bilirubin, Direct: 0.3 mg/dL (ref 0.0–0.3)
Cholesterol: 107 mg/dL (ref 0–200)
Creatinine, Ser: 0.9 mg/dL (ref 0.4–1.2)
Glucose, Bld: 176 mg/dL — ABNORMAL HIGH (ref 70–99)
HDL: 34.5 mg/dL — ABNORMAL LOW (ref >39.0)
LDL Cholesterol: 59 mg/dL (ref 0–99)
Potassium: 4.2 meq/L (ref 3.5–5.1)
Sodium: 140 meq/L (ref 135–145)
Total Protein: 7.1 g/dL (ref 6.0–8.3)
VLDL: 14 mg/dL (ref 0–40)

## 2008-02-23 ENCOUNTER — Ambulatory Visit: Payer: Self-pay | Admitting: Pulmonary Disease

## 2008-02-23 DIAGNOSIS — N3090 Cystitis, unspecified without hematuria: Secondary | ICD-10-CM | POA: Insufficient documentation

## 2008-02-23 DIAGNOSIS — G2581 Restless legs syndrome: Secondary | ICD-10-CM | POA: Insufficient documentation

## 2008-03-01 ENCOUNTER — Telehealth (INDEPENDENT_AMBULATORY_CARE_PROVIDER_SITE_OTHER): Payer: Self-pay | Admitting: *Deleted

## 2008-03-03 ENCOUNTER — Telehealth (INDEPENDENT_AMBULATORY_CARE_PROVIDER_SITE_OTHER): Payer: Self-pay | Admitting: *Deleted

## 2008-03-03 ENCOUNTER — Encounter: Payer: Self-pay | Admitting: Pulmonary Disease

## 2008-03-04 ENCOUNTER — Encounter: Payer: Self-pay | Admitting: Internal Medicine

## 2008-03-14 ENCOUNTER — Telehealth (INDEPENDENT_AMBULATORY_CARE_PROVIDER_SITE_OTHER): Payer: Self-pay | Admitting: *Deleted

## 2008-03-19 ENCOUNTER — Emergency Department (HOSPITAL_COMMUNITY): Admission: EM | Admit: 2008-03-19 | Discharge: 2008-03-20 | Payer: Self-pay | Admitting: Emergency Medicine

## 2008-04-04 ENCOUNTER — Telehealth (INDEPENDENT_AMBULATORY_CARE_PROVIDER_SITE_OTHER): Payer: Self-pay | Admitting: *Deleted

## 2008-04-13 ENCOUNTER — Emergency Department (HOSPITAL_COMMUNITY): Admission: EM | Admit: 2008-04-13 | Discharge: 2008-04-13 | Payer: Self-pay | Admitting: Emergency Medicine

## 2008-04-18 ENCOUNTER — Other Ambulatory Visit: Admission: RE | Admit: 2008-04-18 | Discharge: 2008-04-18 | Payer: Self-pay | Admitting: Obstetrics and Gynecology

## 2008-04-18 ENCOUNTER — Ambulatory Visit: Payer: Self-pay | Admitting: Internal Medicine

## 2008-04-18 ENCOUNTER — Telehealth (INDEPENDENT_AMBULATORY_CARE_PROVIDER_SITE_OTHER): Payer: Self-pay | Admitting: *Deleted

## 2008-04-18 ENCOUNTER — Telehealth: Payer: Self-pay | Admitting: Internal Medicine

## 2008-04-19 LAB — CONVERTED CEMR LAB
Basophils Relative: 0.3 % (ref 0.0–3.0)
Hemoglobin: 10.4 g/dL — ABNORMAL LOW (ref 12.0–15.0)
INR: 1.1 — ABNORMAL HIGH (ref 0.8–1.0)
Lymphocytes Relative: 10.8 % — ABNORMAL LOW (ref 12.0–46.0)
Monocytes Relative: 6.9 % (ref 3.0–12.0)
Neutro Abs: 3.5 10*3/uL (ref 1.4–7.7)
Neutrophils Relative %: 78.9 % — ABNORMAL HIGH (ref 43.0–77.0)
Prothrombin Time: 12.6 s (ref 10.9–13.3)
RBC: 3.14 M/uL — ABNORMAL LOW (ref 3.87–5.11)
Saturation Ratios: 13.3 % — ABNORMAL LOW (ref 20.0–50.0)
WBC: 4.4 10*3/uL — ABNORMAL LOW (ref 4.5–10.5)
aPTT: 31.7 s — ABNORMAL HIGH (ref 21.7–29.8)

## 2008-04-26 ENCOUNTER — Ambulatory Visit (HOSPITAL_COMMUNITY): Admission: RE | Admit: 2008-04-26 | Discharge: 2008-04-26 | Payer: Self-pay | Admitting: Internal Medicine

## 2008-04-28 ENCOUNTER — Ambulatory Visit: Payer: Self-pay | Admitting: Internal Medicine

## 2008-05-01 ENCOUNTER — Encounter: Payer: Self-pay | Admitting: Internal Medicine

## 2008-05-10 ENCOUNTER — Telehealth (INDEPENDENT_AMBULATORY_CARE_PROVIDER_SITE_OTHER): Payer: Self-pay | Admitting: *Deleted

## 2008-05-10 ENCOUNTER — Encounter: Payer: Self-pay | Admitting: Pulmonary Disease

## 2008-05-18 ENCOUNTER — Telehealth: Payer: Self-pay | Admitting: Pulmonary Disease

## 2008-05-19 ENCOUNTER — Ambulatory Visit: Payer: Self-pay | Admitting: Pulmonary Disease

## 2008-05-19 LAB — CONVERTED CEMR LAB
ALT: 26 units/L (ref 0–35)
AST: 35 units/L (ref 0–37)
Albumin: 3.6 g/dL (ref 3.5–5.2)
Alkaline Phosphatase: 106 units/L (ref 39–117)
BUN: 16 mg/dL (ref 6–23)
Bilirubin, Direct: 0.4 mg/dL — ABNORMAL HIGH (ref 0.0–0.3)
CO2: 25 meq/L (ref 19–32)
Eosinophils Relative: 2.5 % (ref 0.0–5.0)
GFR calc Af Amer: 81 mL/min
Glucose, Bld: 236 mg/dL — ABNORMAL HIGH (ref 70–99)
HCT: 32.7 % — ABNORMAL LOW (ref 36.0–46.0)
Hemoglobin: 11.3 g/dL — ABNORMAL LOW (ref 12.0–15.0)
Lymphocytes Relative: 11.1 % — ABNORMAL LOW (ref 12.0–46.0)
Monocytes Absolute: 0.6 10*3/uL (ref 0.1–1.0)
Monocytes Relative: 9.9 % (ref 3.0–12.0)
Platelets: 118 10*3/uL — ABNORMAL LOW (ref 150–400)
Potassium: 4.2 meq/L (ref 3.5–5.1)
RDW: 15.5 % — ABNORMAL HIGH (ref 11.5–14.6)
Sodium: 138 meq/L (ref 135–145)
Total Protein: 7.4 g/dL (ref 6.0–8.3)
WBC: 5.9 10*3/uL (ref 4.5–10.5)

## 2008-05-23 ENCOUNTER — Ambulatory Visit: Payer: Self-pay

## 2008-05-23 ENCOUNTER — Ambulatory Visit: Payer: Self-pay | Admitting: Cardiovascular Disease

## 2008-05-26 ENCOUNTER — Encounter: Payer: Self-pay | Admitting: Pulmonary Disease

## 2008-06-01 ENCOUNTER — Inpatient Hospital Stay (HOSPITAL_COMMUNITY): Admission: AD | Admit: 2008-06-01 | Discharge: 2008-06-05 | Payer: Self-pay | Admitting: Psychiatry

## 2008-06-01 ENCOUNTER — Ambulatory Visit: Payer: Self-pay | Admitting: Psychiatry

## 2008-06-08 ENCOUNTER — Telehealth: Payer: Self-pay | Admitting: Internal Medicine

## 2008-06-09 ENCOUNTER — Ambulatory Visit: Payer: Self-pay | Admitting: Internal Medicine

## 2008-06-09 LAB — CONVERTED CEMR LAB: AFP-Tumor Marker: 2.2 ng/mL (ref 0.0–8.0)

## 2008-06-10 LAB — CONVERTED CEMR LAB
Basophils Absolute: 0 10*3/uL (ref 0.0–0.1)
Basophils Relative: 0.2 % (ref 0.0–3.0)
Eosinophils Absolute: 0.1 10*3/uL (ref 0.0–0.7)
HCT: 33.8 % — ABNORMAL LOW (ref 36.0–46.0)
Hemoglobin: 11.1 g/dL — ABNORMAL LOW (ref 12.0–15.0)
Iron: 57 ug/dL (ref 42–145)
Lymphocytes Relative: 10.9 % — ABNORMAL LOW (ref 12.0–46.0)
MCHC: 32.8 g/dL (ref 30.0–36.0)
MCV: 100.5 fL — ABNORMAL HIGH (ref 78.0–100.0)
Monocytes Absolute: 0.4 10*3/uL (ref 0.1–1.0)
Neutro Abs: 4.1 10*3/uL (ref 1.4–7.7)
RBC: 3.36 M/uL — ABNORMAL LOW (ref 3.87–5.11)
RDW: 15.2 % — ABNORMAL HIGH (ref 11.5–14.6)

## 2008-06-14 ENCOUNTER — Encounter: Payer: Self-pay | Admitting: Internal Medicine

## 2008-06-17 ENCOUNTER — Telehealth (INDEPENDENT_AMBULATORY_CARE_PROVIDER_SITE_OTHER): Payer: Self-pay | Admitting: *Deleted

## 2008-06-20 ENCOUNTER — Ambulatory Visit: Payer: Self-pay | Admitting: Pulmonary Disease

## 2008-07-19 ENCOUNTER — Ambulatory Visit: Payer: Self-pay | Admitting: Pulmonary Disease

## 2008-07-21 LAB — CONVERTED CEMR LAB
Albumin: 3.6 g/dL (ref 3.5–5.2)
BUN: 18 mg/dL (ref 6–23)
Calcium: 9.6 mg/dL (ref 8.4–10.5)
Eosinophils Absolute: 0.2 10*3/uL (ref 0.0–0.7)
GFR calc Af Amer: 81 mL/min
GFR calc non Af Amer: 67 mL/min
Glucose, Bld: 166 mg/dL — ABNORMAL HIGH (ref 70–99)
HCT: 35.2 % — ABNORMAL LOW (ref 36.0–46.0)
Hemoglobin: 12 g/dL (ref 12.0–15.0)
MCV: 99.6 fL (ref 78.0–100.0)
Monocytes Absolute: 0.5 10*3/uL (ref 0.1–1.0)
Monocytes Relative: 8.3 % (ref 3.0–12.0)
Neutro Abs: 4.4 10*3/uL (ref 1.4–7.7)
RDW: 13.2 % (ref 11.5–14.6)
Sodium: 141 meq/L (ref 135–145)
Total Protein: 7.1 g/dL (ref 6.0–8.3)

## 2008-07-29 ENCOUNTER — Ambulatory Visit: Payer: Self-pay | Admitting: Internal Medicine

## 2008-08-29 ENCOUNTER — Encounter: Payer: Self-pay | Admitting: Internal Medicine

## 2008-08-31 ENCOUNTER — Telehealth: Payer: Self-pay | Admitting: Pulmonary Disease

## 2008-09-06 ENCOUNTER — Ambulatory Visit: Payer: Self-pay | Admitting: Pulmonary Disease

## 2008-09-16 ENCOUNTER — Encounter: Payer: Self-pay | Admitting: Cardiovascular Disease

## 2008-09-16 ENCOUNTER — Ambulatory Visit: Payer: Self-pay

## 2008-09-16 ENCOUNTER — Ambulatory Visit: Payer: Self-pay | Admitting: Cardiovascular Disease

## 2008-09-16 DIAGNOSIS — R002 Palpitations: Secondary | ICD-10-CM | POA: Insufficient documentation

## 2008-10-06 ENCOUNTER — Encounter: Payer: Self-pay | Admitting: Pulmonary Disease

## 2008-10-11 ENCOUNTER — Telehealth: Payer: Self-pay | Admitting: Internal Medicine

## 2008-10-20 ENCOUNTER — Encounter: Payer: Self-pay | Admitting: Pulmonary Disease

## 2008-11-01 ENCOUNTER — Ambulatory Visit: Payer: Self-pay | Admitting: Pulmonary Disease

## 2008-11-01 LAB — CONVERTED CEMR LAB: Vit D, 25-Hydroxy: 42 ng/mL (ref 30–89)

## 2008-11-07 LAB — CONVERTED CEMR LAB
ALT: 17 units/L (ref 0–35)
Basophils Absolute: 0.1 10*3/uL (ref 0.0–0.1)
Bilirubin Urine: NEGATIVE
CO2: 28 meq/L (ref 19–32)
Calcium: 9.5 mg/dL (ref 8.4–10.5)
Cholesterol: 128 mg/dL (ref 0–200)
Creatinine, Ser: 0.7 mg/dL (ref 0.4–1.2)
Creatinine,U: 123.6 mg/dL
Eosinophils Absolute: 0.2 10*3/uL (ref 0.0–0.7)
HCT: 35.6 % — ABNORMAL LOW (ref 36.0–46.0)
HDL: 32.7 mg/dL — ABNORMAL LOW (ref >39.0)
Hemoglobin, Urine: NEGATIVE
Hemoglobin: 12.3 g/dL (ref 12.0–15.0)
MCHC: 34.6 g/dL (ref 30.0–36.0)
MCV: 93.4 fL (ref 78.0–100.0)
Microalb, Ur: 0.8 mg/dL (ref 0.0–1.9)
Monocytes Absolute: 0.5 10*3/uL (ref 0.1–1.0)
Neutro Abs: 3.7 10*3/uL (ref 1.4–7.7)
Nitrite: NEGATIVE
RDW: 13.5 % (ref 11.5–14.6)
Total Bilirubin: 1.1 mg/dL (ref 0.3–1.2)
Total Protein, Urine: NEGATIVE mg/dL
Total Protein: 6.9 g/dL (ref 6.0–8.3)
Transferrin: 301.4 mg/dL (ref 212.0–360.0)
Triglycerides: 125 mg/dL (ref 0–149)
Urobilinogen, UA: 1 (ref 0.0–1.0)

## 2008-12-05 ENCOUNTER — Encounter: Payer: Self-pay | Admitting: Pulmonary Disease

## 2008-12-05 ENCOUNTER — Telehealth (INDEPENDENT_AMBULATORY_CARE_PROVIDER_SITE_OTHER): Payer: Self-pay | Admitting: *Deleted

## 2009-01-26 ENCOUNTER — Encounter: Payer: Self-pay | Admitting: Pulmonary Disease

## 2009-02-01 ENCOUNTER — Ambulatory Visit: Payer: Self-pay | Admitting: Pulmonary Disease

## 2009-02-01 DIAGNOSIS — E7800 Pure hypercholesterolemia, unspecified: Secondary | ICD-10-CM

## 2009-02-06 LAB — CONVERTED CEMR LAB
Basophils Absolute: 0 10*3/uL (ref 0.0–0.1)
Basophils Relative: 0.1 % (ref 0.0–3.0)
CO2: 28 meq/L (ref 19–32)
Chloride: 107 meq/L (ref 96–112)
Hemoglobin: 11.5 g/dL — ABNORMAL LOW (ref 12.0–15.0)
Lymphocytes Relative: 15.1 % (ref 12.0–46.0)
Monocytes Relative: 9 % (ref 3.0–12.0)
Neutro Abs: 3.7 10*3/uL (ref 1.4–7.7)
Potassium: 3.5 meq/L (ref 3.5–5.1)
RBC: 3.54 M/uL — ABNORMAL LOW (ref 3.87–5.11)
Saturation Ratios: 17 % — ABNORMAL LOW (ref 20.0–50.0)
Sodium: 143 meq/L (ref 135–145)
Transferrin: 306.6 mg/dL (ref 212.0–360.0)

## 2009-02-20 ENCOUNTER — Telehealth (INDEPENDENT_AMBULATORY_CARE_PROVIDER_SITE_OTHER): Payer: Self-pay | Admitting: *Deleted

## 2009-03-07 ENCOUNTER — Telehealth: Payer: Self-pay | Admitting: Pulmonary Disease

## 2009-03-28 ENCOUNTER — Telehealth (INDEPENDENT_AMBULATORY_CARE_PROVIDER_SITE_OTHER): Payer: Self-pay | Admitting: *Deleted

## 2009-05-02 ENCOUNTER — Telehealth (INDEPENDENT_AMBULATORY_CARE_PROVIDER_SITE_OTHER): Payer: Self-pay | Admitting: *Deleted

## 2009-05-10 ENCOUNTER — Ambulatory Visit: Payer: Self-pay | Admitting: Pulmonary Disease

## 2009-05-11 ENCOUNTER — Telehealth: Payer: Self-pay | Admitting: Pulmonary Disease

## 2009-05-31 ENCOUNTER — Telehealth (INDEPENDENT_AMBULATORY_CARE_PROVIDER_SITE_OTHER): Payer: Self-pay | Admitting: *Deleted

## 2009-06-19 ENCOUNTER — Encounter: Payer: Self-pay | Admitting: Pulmonary Disease

## 2009-06-22 ENCOUNTER — Ambulatory Visit: Payer: Self-pay | Admitting: Pulmonary Disease

## 2009-07-04 ENCOUNTER — Telehealth (INDEPENDENT_AMBULATORY_CARE_PROVIDER_SITE_OTHER): Payer: Self-pay | Admitting: *Deleted

## 2009-07-25 ENCOUNTER — Telehealth (INDEPENDENT_AMBULATORY_CARE_PROVIDER_SITE_OTHER): Payer: Self-pay | Admitting: *Deleted

## 2009-08-24 ENCOUNTER — Ambulatory Visit: Payer: Self-pay | Admitting: Pulmonary Disease

## 2009-08-25 LAB — CONVERTED CEMR LAB
AST: 23 units/L (ref 0–37)
Albumin: 3.3 g/dL — ABNORMAL LOW (ref 3.5–5.2)
BUN: 11 mg/dL (ref 6–23)
Basophils Absolute: 0 10*3/uL (ref 0.0–0.1)
Basophils Relative: 0.7 % (ref 0.0–3.0)
Calcium: 9.2 mg/dL (ref 8.4–10.5)
Cholesterol: 98 mg/dL (ref 0–200)
Creatinine, Ser: 0.8 mg/dL (ref 0.4–1.2)
Creatinine,U: 85.3 mg/dL
GFR calc non Af Amer: 76.25 mL/min (ref >60)
Glucose, Bld: 149 mg/dL — ABNORMAL HIGH (ref 70–99)
HCT: 22.1 % — CL (ref 36.0–46.0)
HDL: 37.4 mg/dL — ABNORMAL LOW (ref >39.00)
Hemoglobin: 7.2 g/dL — CL (ref 12.0–15.0)
Hgb A1c MFr Bld: 5.8 % (ref 4.6–6.5)
LDL Cholesterol: 48 mg/dL (ref 0–99)
Lymphs Abs: 0.6 10*3/uL — ABNORMAL LOW (ref 0.7–4.0)
MCHC: 32.6 g/dL (ref 30.0–36.0)
Microalb Creat Ratio: 16.4 mg/g (ref 0.0–30.0)
Microalb, Ur: 1.4 mg/dL (ref 0.0–1.9)
Monocytes Relative: 7.4 % (ref 3.0–12.0)
Neutro Abs: 4.1 10*3/uL (ref 1.4–7.7)
Potassium: 4 meq/L (ref 3.5–5.1)
RBC: 2.68 M/uL — ABNORMAL LOW (ref 3.87–5.11)
RDW: 18.4 % — ABNORMAL HIGH (ref 11.5–14.6)
TSH: 2.51 microintl units/mL (ref 0.35–5.50)
Total Bilirubin: 1 mg/dL (ref 0.3–1.2)
VLDL: 12.8 mg/dL (ref 0.0–40.0)

## 2009-08-28 ENCOUNTER — Telehealth: Payer: Self-pay | Admitting: Internal Medicine

## 2009-08-30 ENCOUNTER — Ambulatory Visit: Payer: Self-pay | Admitting: Internal Medicine

## 2009-08-31 ENCOUNTER — Ambulatory Visit (HOSPITAL_COMMUNITY): Admission: RE | Admit: 2009-08-31 | Discharge: 2009-08-31 | Payer: Self-pay | Admitting: Vascular Surgery

## 2009-09-04 ENCOUNTER — Ambulatory Visit (HOSPITAL_COMMUNITY): Admission: RE | Admit: 2009-09-04 | Discharge: 2009-09-04 | Payer: Self-pay | Admitting: Pulmonary Disease

## 2009-09-06 DIAGNOSIS — I472 Ventricular tachycardia: Secondary | ICD-10-CM | POA: Insufficient documentation

## 2009-09-18 ENCOUNTER — Ambulatory Visit: Payer: Self-pay | Admitting: Cardiovascular Disease

## 2009-09-27 ENCOUNTER — Ambulatory Visit (HOSPITAL_COMMUNITY)
Admission: RE | Admit: 2009-09-27 | Discharge: 2009-09-27 | Payer: Self-pay | Source: Home / Self Care | Admitting: Internal Medicine

## 2009-09-27 ENCOUNTER — Encounter: Payer: Self-pay | Admitting: Pulmonary Disease

## 2009-09-27 ENCOUNTER — Ambulatory Visit: Payer: Self-pay | Admitting: Internal Medicine

## 2009-10-02 ENCOUNTER — Telehealth: Payer: Self-pay | Admitting: Internal Medicine

## 2009-10-02 ENCOUNTER — Encounter: Payer: Self-pay | Admitting: Pulmonary Disease

## 2009-10-11 ENCOUNTER — Ambulatory Visit: Payer: Self-pay | Admitting: Pulmonary Disease

## 2009-10-11 LAB — CONVERTED CEMR LAB
Basophils Absolute: 0 10*3/uL (ref 0.0–0.1)
Eosinophils Absolute: 0.2 10*3/uL (ref 0.0–0.7)
Iron: 95 ug/dL (ref 42–145)
Lymphocytes Relative: 11 % — ABNORMAL LOW (ref 12.0–46.0)
MCHC: 34.1 g/dL (ref 30.0–36.0)
Monocytes Relative: 7.1 % (ref 3.0–12.0)
Neutro Abs: 4.8 10*3/uL (ref 1.4–7.7)
Neutrophils Relative %: 79.1 % — ABNORMAL HIGH (ref 43.0–77.0)
Platelets: 111 10*3/uL — ABNORMAL LOW (ref 150.0–400.0)
RDW: 19.7 % — ABNORMAL HIGH (ref 11.5–14.6)
Saturation Ratios: 27.9 % (ref 20.0–50.0)

## 2009-10-30 ENCOUNTER — Telehealth (INDEPENDENT_AMBULATORY_CARE_PROVIDER_SITE_OTHER): Payer: Self-pay | Admitting: *Deleted

## 2009-10-30 ENCOUNTER — Ambulatory Visit: Payer: Self-pay | Admitting: Internal Medicine

## 2009-11-01 LAB — CONVERTED CEMR LAB
Basophils Absolute: 0 10*3/uL (ref 0.0–0.1)
Eosinophils Relative: 3.3 % (ref 0.0–5.0)
HCT: 32.2 % — ABNORMAL LOW (ref 36.0–46.0)
Lymphs Abs: 0.6 10*3/uL — ABNORMAL LOW (ref 0.7–4.0)
MCV: 102.1 fL — ABNORMAL HIGH (ref 78.0–100.0)
Monocytes Absolute: 0.4 10*3/uL (ref 0.1–1.0)
Monocytes Relative: 9 % (ref 3.0–12.0)
Neutrophils Relative %: 72 % (ref 43.0–77.0)
Platelets: 87 10*3/uL — ABNORMAL LOW (ref 150.0–400.0)
RDW: 15.2 % — ABNORMAL HIGH (ref 11.5–14.6)
WBC: 4 10*3/uL — ABNORMAL LOW (ref 4.5–10.5)

## 2009-11-27 ENCOUNTER — Telehealth (INDEPENDENT_AMBULATORY_CARE_PROVIDER_SITE_OTHER): Payer: Self-pay | Admitting: *Deleted

## 2009-11-30 ENCOUNTER — Ambulatory Visit: Payer: Self-pay | Admitting: Internal Medicine

## 2009-11-30 LAB — CONVERTED CEMR LAB
Basophils Relative: 0.7 % (ref 0.0–3.0)
Eosinophils Relative: 4.1 % (ref 0.0–5.0)
MCV: 102.1 fL — ABNORMAL HIGH (ref 78.0–100.0)
Monocytes Relative: 9.9 % (ref 3.0–12.0)
Neutrophils Relative %: 68.8 % (ref 43.0–77.0)
Platelets: 93 10*3/uL — ABNORMAL LOW (ref 150.0–400.0)
RBC: 3.14 M/uL — ABNORMAL LOW (ref 3.87–5.11)
WBC: 4.2 10*3/uL — ABNORMAL LOW (ref 4.5–10.5)

## 2009-12-25 ENCOUNTER — Telehealth (INDEPENDENT_AMBULATORY_CARE_PROVIDER_SITE_OTHER): Payer: Self-pay | Admitting: *Deleted

## 2010-01-24 ENCOUNTER — Telehealth: Payer: Self-pay | Admitting: Pulmonary Disease

## 2010-01-29 ENCOUNTER — Telehealth (INDEPENDENT_AMBULATORY_CARE_PROVIDER_SITE_OTHER): Payer: Self-pay | Admitting: *Deleted

## 2010-02-02 ENCOUNTER — Telehealth: Payer: Self-pay | Admitting: Internal Medicine

## 2010-02-05 ENCOUNTER — Ambulatory Visit: Payer: Self-pay | Admitting: Internal Medicine

## 2010-02-05 LAB — CONVERTED CEMR LAB
ALT: 20 units/L (ref 0–35)
AST: 22 units/L (ref 0–37)
Alkaline Phosphatase: 92 units/L (ref 39–117)
Bilirubin, Direct: 0.2 mg/dL (ref 0.0–0.3)
CO2: 27 meq/L (ref 19–32)
Calcium: 9.4 mg/dL (ref 8.4–10.5)
Chloride: 100 meq/L (ref 96–112)
Glucose, Bld: 248 mg/dL — ABNORMAL HIGH (ref 70–99)
Hemoglobin: 11.1 g/dL — ABNORMAL LOW (ref 12.0–15.0)
Lymphocytes Relative: 9.5 % — ABNORMAL LOW (ref 12.0–46.0)
Monocytes Relative: 6.3 % (ref 3.0–12.0)
Neutro Abs: 4.9 10*3/uL (ref 1.4–7.7)
Neutrophils Relative %: 81 % — ABNORMAL HIGH (ref 43.0–77.0)
Potassium: 3.8 meq/L (ref 3.5–5.1)
RDW: 13.7 % (ref 11.5–14.6)
Sodium: 138 meq/L (ref 135–145)
TSH: 1.75 microintl units/mL (ref 0.35–5.50)
Total Protein: 6.9 g/dL (ref 6.0–8.3)

## 2010-02-06 LAB — CONVERTED CEMR LAB
Basophils Relative: 0.4 % (ref 0.0–3.0)
Eosinophils Absolute: 0.2 10*3/uL (ref 0.0–0.7)
Eosinophils Relative: 2.9 % (ref 0.0–5.0)
HCT: 32.4 % — ABNORMAL LOW (ref 36.0–46.0)
Hemoglobin: 11.2 g/dL — ABNORMAL LOW (ref 12.0–15.0)
Lymphs Abs: 0.6 10*3/uL — ABNORMAL LOW (ref 0.7–4.0)
MCHC: 34.5 g/dL (ref 30.0–36.0)
MCV: 100.4 fL — ABNORMAL HIGH (ref 78.0–100.0)
Monocytes Absolute: 0.5 10*3/uL (ref 0.1–1.0)
Neutro Abs: 3.9 10*3/uL (ref 1.4–7.7)
RBC: 3.22 M/uL — ABNORMAL LOW (ref 3.87–5.11)
WBC: 5.2 10*3/uL (ref 4.5–10.5)

## 2010-02-19 ENCOUNTER — Ambulatory Visit (HOSPITAL_COMMUNITY): Admission: RE | Admit: 2010-02-19 | Discharge: 2010-02-19 | Payer: Self-pay | Admitting: Internal Medicine

## 2010-02-28 ENCOUNTER — Telehealth: Payer: Self-pay | Admitting: Pulmonary Disease

## 2010-03-26 ENCOUNTER — Telehealth (INDEPENDENT_AMBULATORY_CARE_PROVIDER_SITE_OTHER): Payer: Self-pay | Admitting: *Deleted

## 2010-04-26 ENCOUNTER — Telehealth (INDEPENDENT_AMBULATORY_CARE_PROVIDER_SITE_OTHER): Payer: Self-pay | Admitting: *Deleted

## 2010-05-02 ENCOUNTER — Encounter (INDEPENDENT_AMBULATORY_CARE_PROVIDER_SITE_OTHER): Payer: Self-pay | Admitting: *Deleted

## 2010-05-08 ENCOUNTER — Ambulatory Visit: Payer: Self-pay | Admitting: Internal Medicine

## 2010-05-28 ENCOUNTER — Telehealth (INDEPENDENT_AMBULATORY_CARE_PROVIDER_SITE_OTHER): Payer: Self-pay | Admitting: *Deleted

## 2010-06-08 ENCOUNTER — Ambulatory Visit: Payer: Self-pay | Admitting: Pulmonary Disease

## 2010-06-09 LAB — CONVERTED CEMR LAB
Albumin: 3.5 g/dL (ref 3.5–5.2)
Bilirubin Urine: NEGATIVE
CO2: 26 meq/L (ref 19–32)
Calcium: 9.1 mg/dL (ref 8.4–10.5)
Creatinine, Ser: 0.9 mg/dL (ref 0.4–1.2)
Glucose, Bld: 215 mg/dL — ABNORMAL HIGH (ref 70–99)
Hemoglobin, Urine: NEGATIVE
Hgb A1c MFr Bld: 7.1 % — ABNORMAL HIGH (ref 4.6–6.5)
Ketones, ur: NEGATIVE mg/dL
LDL Cholesterol: 66 mg/dL (ref 0–99)
Nitrite: POSITIVE
TSH: 2.85 microintl units/mL (ref 0.35–5.50)
Total CHOL/HDL Ratio: 3
Total Protein: 6.4 g/dL (ref 6.0–8.3)
Triglycerides: 105 mg/dL (ref 0.0–149.0)
Urobilinogen, UA: 0.2 (ref 0.0–1.0)
VLDL: 21 mg/dL (ref 0.0–40.0)

## 2010-06-18 ENCOUNTER — Telehealth (INDEPENDENT_AMBULATORY_CARE_PROVIDER_SITE_OTHER): Payer: Self-pay | Admitting: *Deleted

## 2010-06-26 ENCOUNTER — Ambulatory Visit: Payer: Self-pay | Admitting: Internal Medicine

## 2010-06-26 ENCOUNTER — Telehealth (INDEPENDENT_AMBULATORY_CARE_PROVIDER_SITE_OTHER): Payer: Self-pay | Admitting: *Deleted

## 2010-07-26 ENCOUNTER — Ambulatory Visit: Payer: Self-pay | Admitting: Internal Medicine

## 2010-07-26 ENCOUNTER — Telehealth: Payer: Self-pay | Admitting: Pulmonary Disease

## 2010-07-31 ENCOUNTER — Ambulatory Visit: Payer: Self-pay | Admitting: Internal Medicine

## 2010-07-31 ENCOUNTER — Telehealth (INDEPENDENT_AMBULATORY_CARE_PROVIDER_SITE_OTHER): Payer: Self-pay | Admitting: *Deleted

## 2010-07-31 ENCOUNTER — Encounter: Payer: Self-pay | Admitting: Pulmonary Disease

## 2010-07-31 LAB — CONVERTED CEMR LAB
ALT: 35 units/L (ref 0–35)
AST: 41 units/L — ABNORMAL HIGH (ref 0–37)
Albumin: 3.5 g/dL (ref 3.5–5.2)
Alkaline Phosphatase: 89 units/L (ref 39–117)
Basophils Absolute: 0 10*3/uL (ref 0.0–0.1)
Basophils Relative: 0.3 % (ref 0.0–3.0)
Bilirubin, Direct: 0.4 mg/dL — ABNORMAL HIGH (ref 0.0–0.3)
Eosinophils Absolute: 0.2 10*3/uL (ref 0.0–0.7)
Eosinophils Relative: 2.6 % (ref 0.0–5.0)
Ferritin: 27.8 ng/mL (ref 10.0–291.0)
Folate: >20 ng/mL
HCT: 33 % — ABNORMAL LOW (ref 36.0–46.0)
Hemoglobin: 11.1 g/dL — ABNORMAL LOW (ref 12.0–15.0)
Iron: 106 ug/dL (ref 42–145)
Lymphocytes Relative: 8.5 % — ABNORMAL LOW (ref 12.0–46.0)
Lymphs Abs: 0.5 10*3/uL — ABNORMAL LOW (ref 0.7–4.0)
MCHC: 33.8 g/dL (ref 30.0–36.0)
MCV: 98.4 fL (ref 78.0–100.0)
Monocytes Absolute: 0.5 10*3/uL (ref 0.1–1.0)
Monocytes Relative: 8.4 % (ref 3.0–12.0)
Neutro Abs: 5.1 10*3/uL (ref 1.4–7.7)
Neutrophils Relative %: 80.2 % — ABNORMAL HIGH (ref 43.0–77.0)
Platelets: 121 10*3/uL — ABNORMAL LOW (ref 150.0–400.0)
RBC: 3.35 M/uL — ABNORMAL LOW (ref 3.87–5.11)
RDW: 16 % — ABNORMAL HIGH (ref 11.5–14.6)
Saturation Ratios: 22.3 % (ref 20.0–50.0)
Total Bilirubin: 1.2 mg/dL (ref 0.3–1.2)
Total Protein: 6.5 g/dL (ref 6.0–8.3)
Transferrin: 339.5 mg/dL (ref 212.0–360.0)
Vitamin B-12: 472 pg/mL (ref 211–911)
WBC: 6.3 10*3/uL (ref 4.5–10.5)

## 2010-08-01 LAB — CONVERTED CEMR LAB
Eosinophils Absolute: 0.1 10*3/uL (ref 0.0–0.7)
Eosinophils Relative: 2.7 % (ref 0.0–5.0)
HCT: 26.4 % — ABNORMAL LOW (ref 36.0–46.0)
Lymphs Abs: 0.4 10*3/uL — ABNORMAL LOW (ref 0.7–4.0)
MCHC: 34.7 g/dL (ref 30.0–36.0)
MCV: 94.1 fL (ref 78.0–100.0)
Monocytes Absolute: 0.4 10*3/uL (ref 0.1–1.0)
Neutrophils Relative %: 74.1 % (ref 43.0–77.0)
Platelets: 96 10*3/uL — ABNORMAL LOW (ref 150.0–400.0)
WBC: 3.5 10*3/uL — ABNORMAL LOW (ref 4.5–10.5)

## 2010-08-02 ENCOUNTER — Ambulatory Visit: Payer: Self-pay | Admitting: Internal Medicine

## 2010-08-03 LAB — CONVERTED CEMR LAB
Iron: 76 ug/dL (ref 42–145)
Transferrin: 352.5 mg/dL (ref 212.0–360.0)

## 2010-08-09 ENCOUNTER — Encounter: Payer: Self-pay | Admitting: Pulmonary Disease

## 2010-08-28 ENCOUNTER — Telehealth: Payer: Self-pay | Admitting: Pulmonary Disease

## 2010-09-18 ENCOUNTER — Ambulatory Visit
Admission: RE | Admit: 2010-09-18 | Discharge: 2010-09-18 | Payer: Self-pay | Source: Home / Self Care | Attending: Internal Medicine | Admitting: Internal Medicine

## 2010-09-18 ENCOUNTER — Other Ambulatory Visit: Payer: Self-pay | Admitting: Internal Medicine

## 2010-09-18 LAB — CBC WITH DIFFERENTIAL/PLATELET
MCHC: 33.8 g/dL (ref 30.0–36.0)
Monocytes Absolute: 0.4 10*3/uL (ref 0.1–1.0)
Neutro Abs: 3 10*3/uL (ref 1.4–7.7)
Neutrophils Relative %: 76.7 % (ref 43.0–77.0)
RDW: 17.6 % — ABNORMAL HIGH (ref 11.5–14.6)

## 2010-09-23 LAB — CONVERTED CEMR LAB
ALT: 26 U/L
AST: 29 U/L
Albumin: 3.4 g/dL — ABNORMAL LOW
Alkaline Phosphatase: 114 U/L
BUN: 12 mg/dL
Basophils Absolute: 0 K/uL
Basophils Relative: 0.9 %
Bilirubin, Direct: 0.3 mg/dL
CO2: 29 meq/L
Calcium: 9.5 mg/dL
Chloride: 104 meq/L
Creatinine, Ser: 0.7 mg/dL
Eosinophils Absolute: 0.1 K/uL
Eosinophils Relative: 3.1 %
GFR calc Af Amer: 108 mL/min
GFR calc non Af Amer: 90 mL/min
Glucose, Bld: 142 mg/dL — ABNORMAL HIGH
HCT: 28 % — ABNORMAL LOW
Hemoglobin: 8.9 g/dL — ABNORMAL LOW
Hgb A1c MFr Bld: 5.9 %
Lymphocytes Relative: 13.4 %
MCHC: 31.7 g/dL
MCV: 97.9 fL
Monocytes Absolute: 0.4 K/uL
Monocytes Relative: 9.5 %
Neutro Abs: 3.3 K/uL
Neutrophils Relative %: 73.1 %
Platelets: 118 K/uL — ABNORMAL LOW
Potassium: 3.3 meq/L — ABNORMAL LOW
Pro B Natriuretic peptide (BNP): 117 pg/mL — ABNORMAL HIGH
RBC: 2.86 M/uL — ABNORMAL LOW
RDW: 15.4 % — ABNORMAL HIGH
Sodium: 140 meq/L
TSH: 1.55 u[IU]/mL
Total Bilirubin: 1.3 mg/dL — ABNORMAL HIGH
Total Protein: 6.5 g/dL
WBC: 4.4 10*3/microliter — ABNORMAL LOW

## 2010-09-24 ENCOUNTER — Ambulatory Visit (HOSPITAL_COMMUNITY)
Admission: RE | Admit: 2010-09-24 | Discharge: 2010-09-24 | Payer: Self-pay | Source: Home / Self Care | Attending: Internal Medicine | Admitting: Internal Medicine

## 2010-09-26 ENCOUNTER — Telehealth: Payer: Self-pay | Admitting: Pulmonary Disease

## 2010-09-27 NOTE — Progress Notes (Signed)
Summary: samples  Phone Note Call from Patient Call back at Home Phone [phone]   Caller: Spouse Call For: nadel Summary of Call: needs samples of advair 250/50- also [place] Initial call taken by: Tivis Ringer, CNA,  October 30, 2009 11:36 AM  Follow-up for Phone Call        Pt informed that samples were left at front desk for pick up. Abigail Miyamoto RN  October 30, 2009 11:42 AM

## 2010-09-27 NOTE — Progress Notes (Signed)
Summary: samples  Phone Note Call from Patient Call back at Home Phone [phone]   Caller: Patient Call For: nadel Reason for Call: Talk to Nurse Summary of Call: Patient asking for samples of advair 250-50 and januvia 100mg  Initial call taken by: Lehman Prom,  July 26, 2010 8:51 AM  Follow-up for Phone Call        Called and verified sample request with pt. 4 packs of Januvia 100mg  and 2 boxes of Advair 250 placed at front desk. Pt aware. Zackery Barefoot CMA  July 26, 2010 9:50 AM

## 2010-09-27 NOTE — Progress Notes (Signed)
Summary: samples  Phone Note Call from Patient Call back at Home Phone [phone]   Caller: [facility] Call For: nadel Summary of Call: needs samples of advair 250/50 and januvia Initial call taken by: Tivis Ringer, CNA,  June 26, 2010 9:06 AM  Follow-up for Phone Call        1 sample of Advair 250/50 and 2 samples of Januvia 100mg  at front for pick up.  Pt aware.  Advair  Lot #: [id number], Exp Date 07/2011 Januvia 100mg  Lot #: [id number], Exp Date 10/2011 Follow-up by: Gweneth Dimitri RN,  June 26, 2010 9:21 AM

## 2010-09-27 NOTE — Progress Notes (Signed)
Summary: samples  Phone Note Call from Patient Call back at Home Phone [phone]   Caller: Patient Call For: Rudine Rieger Summary of Call: Wants samples of advair 250-50 and januvia 100mg . Initial call taken by: Darletta Moll,  February 28, 2010 8:39 AM  Follow-up for Phone Call        samples left up front and pt is aware Randell Loop CMA  February 28, 2010 9:33 AM

## 2010-09-27 NOTE — Progress Notes (Signed)
Summary: samples  Phone Note Call from Patient Call back at Home Phone [phone]   Caller: Spouse//jerry Call For: nadel Summary of Call: Needs samples of advair diskus 250-1mcg and januvia 100mg . Initial call taken by: Darletta Moll,  May 28, 2010 8:37 AM  Follow-up for Phone Call        1 sample of each left up front.  Pt made aware. Follow-up by: Vernie Murders,  May 28, 2010 9:28 AM

## 2010-09-27 NOTE — Assessment & Plan Note (Signed)
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 Her last one was in February 2006. She also has a history of a benign esophageal stricture. Her last colonoscopy in October 2008 showed the presence of angiodysplasia and adenomatous polyps of the colon. She was found to have a hemoglobin of 7.2 and a hematocrit of 22 last week. She denies any melenic stools. Her last hemoglobin prior to that in June 2010 was 11.3.   GI Review of Systems    Reports nausea.      Denies abdominal pain, acid reflux, belching, bloating, chest pain, dysphagia with liquids, dysphagia with solids, heartburn, loss of appetite, vomiting, vomiting blood, weight loss, and  weight gain.      Reports constipation.     Denies anal fissure, black tarry stools, change in bowel habit, diarrhea, diverticulosis, fecal incontinence, heme positive stool, hemorrhoids, irritable bowel syndrome, jaundice, light color stool, liver problems, rectal bleeding, and  rectal pain.    Current Medications (verified): 1)  Advair Diskus 250-50 Mcg/dose  Misc (Fluticasone-Salmeterol) .... Two Times A Day 2)  Proair Hfa 108 (90 Base) Mcg/act Aers (Albuterol Sulfate) .[name].. 1-2 Inhalations Every 6 H As Needed For Wheezing... 3)  Mucinex Dm 30-600 Mg  Tb12 (Dextromethorphan-Guaifenesin) .... Take 1 By Mouth Two Times A Day 4)  Atenolol 50 Mg Tabs (Atenolol) .... Take 1 Tab By Mouth Once Daily.Marland KitchenMarland Kitchen 5)  Lasix 40 Mg  Tabs (Furosemide) .... Take 1 By  Mouth Two Times A Day 6)  Klor-Con M20 20 Meq Tbcr (Potassium Chloride Crys Cr) .... Take 1 Tablet By Mouth Once A Day 7)  Simvastatin 40 Mg  Tabs (Simvastatin) .... Take 1 Tab By Mouth At Bedtime For Cholesterol 8)  Lantus Solostar 100 Unit/ml Soln (Insulin Glargine) .... Inject 10 U Daily Each Am..subcutaneously 9)  Glucophage 500 Mg  Tabs (Metformin Hcl) .... Take 2 By Mouth Two Times A Day 10)  Glimepiride 4 Mg  Tabs (Glimepiride) .[name].. 1 Tab Daily 11)  Januvia 100 Mg  Tabs (Sitagliptin Phosphate) .... Take 1 By Mouth Once Daily 12)  Omeprazole 20 Mg  Tbec (Omeprazole) .... Take 1 By Mouth Two Times A Day 13)  Promethazine Hcl 25 Mg Tabs (Promethazine Hcl) .[name].. 1 Tab Every 6 Hours As Needed For Nausea 14)  Imuran 50 Mg  Tabs (Azathioprine) .... Take 1 By Mouth Once Daily. 15)  Risperdal 3 Mg Tabs (Risperidone) .... Take 1/2 Tab By Mouth At Bedtime As Directed By Drhejazi... 16)  Pristiq 50 Mg Xr24h-Tab (Desvenlafaxine Succinate) .... Take 1 Tablet By Mouth Once A Day 17)  Nuvigil 150 Mg Tabs (Armodafinil) .... Take 1 Tablet By Mouth Once A Day 18)  Xanax 1 Mg  Tabs (Alprazolam) .... Take 1 Tablet By Mouth Two Times A Day As Needed 19)  Bupropion Hcl 150 Mg Xr24h-Tab (Bupropion Hcl) .... Take 1 Tab Daily As Directed By Drhejazi... 20)  Requip 4 Mg Tabs (Ropinirole Hcl) .... Take 1 Tab By Mouth At Bedtime 21)  Centrum Silver  Tabs (Multiple Vitamins-Minerals) .... Take 1 Tab By Mouth Once Daily... 22)  B Complex  Tabs (B Complex Vitamins) .... Take 1 Tab Daily... 23)  Vitamin D 16109 Unit Caps (Ergocalciferol) .... Take One Capsule Each Week  Allergies (verified): 1)  ! Pcn 2)  ! Theophylline 3)  ! * Asprin  Past History:  Past Medical History: Reviewed history from 08/24/2009 and no changes required.  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 hemorrhoidectomy hysterectomy bilateral breast biopsies-benign c-section x3 ulcer surgery  Family History: Reviewed history from 06/09/2008 and no changes required. Family History of Heart Disease: Mother, Maternal Grandfather No FH of Colon Cancer: Family History of Breast Cancer:Sister, maternal aunt x 2 Cervical Cancer: mother Family History of Diabetes:mother, father, sisters  Family History of Kidney Disease:mother  Social History: Reviewed history from 04/14/2008 and no changes required. Patient is a former smoker.  Alcohol Use - no Illicit Drug Use - no  Review of Systems       The patient complains of anemia, anxiety-new, arthritis/joint pain, change in vision, depression-new, fatigue, heart murmur, heart rhythm changes, itching, night sweats, shortness of breath, sleeping problems, thirst - excessive, urination - excessive, and vision changes.         Pertinent positive and negative review of systems were noted in the above HPI. All other ROS was otherwise negative.   Vital Signs:  Patient profile:   70 year old female Height:      63 inches Weight:      218.13 pounds BMI:     38.78 Pulse rate:   60 / minute Pulse rhythm:   regular BP sitting:   102 / 54  (left arm) Cuff size:   regular  Vitals Entered By: June McMurray CMA Duncan Dull) (August 30, 2009 12:01 PM)  Physical  Exam  General:  patient appears lethargic but she is oriented and moves very slowly; she appears rather pale. Eyes:  nonicteric. Mouth:  normal oral mucosa. Neck:  jugular veins not distended. Lungs:  clear lungs. Heart:  normal S1, normal S2, no murmur. Abdomen:  soft obese abdomen with prominent venous pattern consistent with portal hypertension. Liver edge at the right costal margin and appears tender. There is no bruit over the liver. Lower abdomen is unremarkable. Left upper quadrant is normal. Rectal:  decreased rectal tone. Stool is yellow and strongly Hemoccult positive. Msk:  trace edema of both extremities.   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 She is on Imuran 100 mg daily. She has splenomegaly but her last liver function tests were normal.  Problem # 2:  MORBID OBESITY (ICD-278.01) Patient is status post massive weight loss. She is down to 218 pounds and some of it may be fluid retention.  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 We will schedule her for an upper endoscopy with argon laser ablation of  the AVMs. Her last treatment was in 2006. She is to start Nexium 40 mg twice a day in place of Prilosec 20 mg twice a day. Because of her low hemoglobin of 7.2 and being symptomatic, we will proceed with blood transfusion of 2 units of packed cells and iron infusion. Orders: ZEGD/APC (ZEGD/APC)  Other Orders: Blood Transfusion (Bld Trans)  Patient Instructions: 1)  transfuse 2 units of packed cells as an outpatient. 2)  Keep appointment for iron infusion. 3)  Nexium 40 mg p.o. b.i.d. 4)  Upper endoscopy with laser ablation of watermelon stomach. 5)  Copy sent to : Dr Jodelle Green

## 2010-09-27 NOTE — Progress Notes (Signed)
Summary: samples  Phone Note Call from Patient   Caller: Patient Call For: nadel Summary of Call: samples for junedia and advair Initial call taken by: Rickard Patience,  April 26, 2010 9:52 AM  Follow-up for Phone Call        Samples of each at front for pick up and pt aware.Reynaldo Minium CMA  April 26, 2010 11:36 AM

## 2010-09-27 NOTE — Progress Notes (Signed)
Summary: samples  Phone Note Call from Patient   Caller: Patient Call For: nadel Summary of Call: samples of advair junuvia Initial call taken by: Rickard Patience,  Dec 25, 2009 2:03 PM  Follow-up for Phone Call        Samples available of each med requested up front and pt aware Follow-up by: Vernie Murders,  Dec 25, 2009 2:40 PM

## 2010-09-27 NOTE — Assessment & Plan Note (Signed)
Summary: f1y  Medications Added BUPROPION HCL 150 MG XR12H-TAB (BUPROPION HCL) Take 1 tablet by mouth once a day as directed by Dr. Wynonia Lawman...        Visit Type:  1 year follow up Primary Provider:  Alroy Dust  CC:  No complaints.  History of Present Illness: This is a 70 year-old woman with multiple medical problems presenting today for follow-up cardiac evaluation. She was seen last year for palpitations and has had a very good symptomatic response to Atenolol. She denies recurrent palpitations. No chest pain, edema, orthopnea, or PND.  The patient underwent diagnostic cardiac catheterization in 2009 and had nonobstructive coronary artery disease with normal left ventricular function. She has had multiple medical problems and is followed closely by Dr. Kriste Basque.  Current Medications (verified): 1)  Advair Diskus 250-50 Mcg/dose  Misc (Fluticasone-Salmeterol) .... Two Times A Day 2)  Proair Hfa 108 (90 Base) Mcg/act Aers (Albuterol Sulfate) .[name].. 1-2 Inhalations Every 6 H As Needed For Wheezing... 3)  Mucinex Dm 30-600 Mg  Tb12 (Dextromethorphan-Guaifenesin) .... Take 1 By Mouth Two Times A Day 4)  Atenolol 50 Mg Tabs (Atenolol) .... Take 1 Tab By Mouth Once Daily.Marland KitchenMarland Kitchen 5)  Lasix 40 Mg  Tabs (Furosemide) .... Take 1 By Mouth Two Times A Day 6)  Klor-Con M20 20 Meq Tbcr (Potassium Chloride Crys Cr) .... Take 1 Tablet By Mouth Once A Day 7)  Simvastatin 40 Mg  Tabs (Simvastatin) .... Take 1 Tab By Mouth At Bedtime For Cholesterol 8)  Lantus Solostar 100 Unit/ml Soln (Insulin Glargine) .... Inject 10 U Daily Each Am..subcutaneously 9)  Glucophage 500 Mg  Tabs (Metformin Hcl) .... Take 2 By Mouth Two Times A Day 10)  Glimepiride 4 Mg  Tabs (Glimepiride) .[name].. 1 Tab Daily 11)  Januvia 100 Mg  Tabs (Sitagliptin Phosphate) .... Take 1 By Mouth Once Daily 12)  Omeprazole 20 Mg  Tbec (Omeprazole) .... Take 1 By Mouth Two Times A Day 13)  Promethazine Hcl 25 Mg Tabs (Promethazine Hcl) .[name].. 1 Tab Every  6 Hours As Needed For Nausea 14)  Imuran 50 Mg  Tabs (Azathioprine) .... Take 1 By Mouth Once Daily. 15)  Risperdal 3 Mg Tabs (Risperidone) .... Take 1/2 Tab By Mouth At Bedtime As Directed By Drhejazi... 16)  Pristiq 50 Mg Xr24h-Tab (Desvenlafaxine Succinate) .... Take 1 Tablet By Mouth Once A Day 17)  Nuvigil 150 Mg Tabs (Armodafinil) .... Take 1 Tablet By Mouth Once A Day 18)  Xanax 1 Mg  Tabs (Alprazolam) .... Take 1 Tablet By Mouth Two Times A Day As Needed 19)  Requip 4 Mg Tabs (Ropinirole Hcl) .... Take 1 Tab By Mouth At Bedtime 20)  Centrum Silver  Tabs (Multiple Vitamins-Minerals) .... Take 1 Tab By Mouth Once Daily... 21)  B Complex  Tabs (B Complex Vitamins) .... Take 1 Tab Daily... 22)  Vitamin D 59563 Unit Caps (Ergocalciferol) .... Take One Capsule Each Week 23)  Bd Pen Needle Short U/f 31g X 8 Mm Misc (Insulin Pen Needle) .... Use As Directed 24)  Bupropion Hcl 150 Mg Xr12h-Tab (Bupropion Hcl) .... Take 1 Tablet By Mouth Once A Day As Directed By Dr. Wynonia Lawman...  Allergies: 1)  ! Pcn 2)  ! Theophylline 3)  ! * Asprin  Past History:  Past medical history reviewed for relevance to current acute and chronic problems.  Past Medical History: Reviewed history from 09/06/2009 and no changes required. 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) OSTEOARTHRITIS (ICD-715.90) FIBROMYALGIA (ICD-729.1) OSTEOPOROSIS (ICD-733.00) DEPRESSION,  MAJOR, RECURRENT, SEVERE, W/PSYCHOTIC BEHAVIOR (ICD-296.34) 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 HEENT: normal Neck: normal carotid upstrokes without bruits, JVP normal Lungs: CTA CV: RRR with 2/6 systolic murmur along LSB Abd: soft, NT, obese, positive BS, no bruit, no organomegaly Ext: no clubbing, cyanosis, or edema. peripheral pulses 2+ and equal Skin: warm and dry without rash    EKG  Procedure date:  09/18/2009  Findings:      Sinus bradycardia, LVH, age-indeterminate lateral infarct, HR 54 bpm.  Impression & Recommendations:  Problem # 1:  AORTIC STENOSIS (ICD-424.1) Echo last year showed mild AS/AI. Pt asymptomatic - recommend clinical follow-up.  Her updated medication list for this problem includes:    Atenolol 50 Mg Tabs (Atenolol) .[name]... Take 1 tab by mouth once daily...    Lasix 40 Mg Tabs (Furosemide) .[name]... Take 1 by mouth two times a day  Orders: EKG w/ Interpretation (93000)  Problem # 2:  HYPERTENSION (ICD-401.9) Well-controlled. Her updated medication list for this problem includes:    Atenolol 50 Mg Tabs (Atenolol) .[name]... Take 1 tab by mouth once daily...    Lasix 40 Mg Tabs (Furosemide) .[name]... Take 1 by mouth two times a day  BP today: 104/70 Prior BP: 102/54 (08/30/2009)  Labs Reviewed: K+: 4.0 (08/24/2009) Creat: : 0.8 (08/24/2009)   Chol: 98 (08/24/2009)   HDL: 37.40 (08/24/2009)   LDL: 48 (08/24/2009)   TG: 64.0 (08/24/2009)  Problem # 3:  PALPITATIONS (ICD-785.1) resolved with beta-blockade  Her updated medication list  for this problem includes:    Atenolol 50 Mg Tabs (Atenolol) .[name]... Take 1 tab by mouth once daily...  Patient Instructions: 1)  Your physician recommends that you continue on your current medications as directed. Please refer to the Current Medication list given to you today. 2)  Your physician wants you to follow-up in:  1 YEAR.  You will receive a reminder letter in the mail two months in advance. If you don't receive a letter, please call our office to schedule the follow-up appointment.

## 2010-09-27 NOTE — Letter (Signed)
Summary: [facility]  [facility]   Imported By: Sherian Rein 10/06/2009 09:36:39  _____________________________________________________________________  External Attachment:    Type:   Image     Comment:   External Document

## 2010-09-27 NOTE — Progress Notes (Signed)
----   Converted from flag ---- ---- 07/26/2010 4:12 PM, Jesse Fall RN wrote: Patient is coming for repeat labs. ------------------------------

## 2010-09-27 NOTE — Procedures (Signed)
Summary: Instructions for procedure/[facility] (out pt)  Instructions for procedure/[facility] (out pt)   Imported By: Sherian Rein 09/04/2009 07:38:09  _____________________________________________________________________  External Attachment:    Type:   Image     Comment:   External Document

## 2010-09-27 NOTE — Progress Notes (Signed)
Summary: When should she have labs?  Phone Note Call from Patient Call back at Home Phone [phone]   Call For: Dr Juanda Chance Reason for Call: Talk to Nurse Summary of Call: When does she need to have more labwork? Initial call taken by: Leanor Kail [facility],  February 02, 2010 10:14 AM  Follow-up for Phone Call        Pt. is aware she can come 02-05-10 for labs, the order is in IDX. Pt. instructed to call back as needed.  Follow-up by: Laureen Ochs LPN,  February 02, 2010 1:38 PM

## 2010-09-27 NOTE — Letter (Signed)
Summary: EGD Instructions   Gastroenterology  [address] [place], [place] [address]   Phone: [phone]  Fax: [phone]       JEZELLE GULLICK    [birth year]    MRN: [id number]       Procedure Day /Date: 09/27/09 (Wednesday)     Arrival Time: 7:30 am     Procedure Time: 8:30 am     Location of Procedure:                    _x  _ [facility] ( Outpatient Registration)  PREPARATION FOR ENDOSCOPY   On 09/27/09 THE DAY OF THE PROCEDURE:  1.   No solid foods, milk or milk products are allowed after midnight the night before your procedure.  2.   Do not drink anything colored red or purple.  Avoid juices with pulp.  No orange juice.  3.  You may drink clear liquids until 4:30 am, which is 4 hours before your procedure.                                                                                                CLEAR LIQUIDS INCLUDE: Water Jello Ice Popsicles Tea (sugar ok, no milk/cream) Powdered fruit flavored drinks Coffee (sugar ok, no milk/cream) Gatorade Juice: apple, white grape, white cranberry  Lemonade Clear bullion, consomm, broth Carbonated beverages (any kind) Strained chicken noodle soup Hard Candy   MEDICATION INSTRUCTIONS  Unless otherwise instructed, you should take regular prescription medications with a small sip of water as early as possible the morning of your procedure.  Diabetic patients - see separate instructions.              OTHER INSTRUCTIONS  You will need a responsible adult at least 68 years of age to accompany you and drive you home.   This person must remain in the waiting room during your procedure.  Wear loose fitting clothing that is easily removed.  Leave jewelry and other valuables at home.  However, you may wish to bring a book to read or an iPod/MP3 player to listen to music as you wait for your procedure to start.  Remove all body piercing jewelry and leave at home.  Total time from sign-in until  discharge is approximately 2-3 hours.  You should go home directly after your procedure and rest.  You can resume normal activities the day after your procedure.  The day of your procedure you should not:   Drive   Make legal decisions   Operate machinery   Drink alcohol   Return to work  You will receive specific instructions about eating, activities and medications before you leave.    The above instructions have been reviewed and explained to me by   Hortense Ramal, CMA    I fully understand and can verbalize these instructions _____________________________ Date 08/30/09

## 2010-09-27 NOTE — Procedures (Signed)
Summary: EGD/[facility]  EGD/[facility]   Imported By: Sherian Rein 10/13/2009 12:13:06  _____________________________________________________________________  External Attachment:    Type:   Image     Comment:   External Document

## 2010-09-27 NOTE — Assessment & Plan Note (Signed)
Summary: 6 months/apc   Primary Care [name]:  [name] Dust, MD   CC:  8 month ROV & review of mult medical problems....  History of Present Illness: 70 y/o WF here for a follow up visit... she has mult med problems as outlined in my prev notes.   ~  Jan10:  continues to do well- had a good holiday & stable on her meds... saw DrKlein in Dec09- hx palpit w/ 7 beat run of nonsustained VT on event recorder that correlated to her symptoms... he changed her Lisinopril to Propranolol LA 60mg /d & doing better at present... saw DrCooper for Cards f/u Jan10- 2DEcho showed norm LVF w/ EF= 55-60%, no regional wall motion abn, mild DD, mod calcif AoV & mild reduced leaflet excursion & mild AI... he changed Propranolol to Atenolol.  ~  Jun10:  states she's doing well x for arthritis- limited to Tylenol for relief due to GI hx etc... denies abd pain, change in BM's, etc...continues f/u w/ DrHejazi & [name] w/ meds adjusted- off Trazodone, they request fax of labs- Chol OK on Simva40, BS= 169 & A1c= 6.8, Hg= 12.3 & Fe= 79...  ~  Sep10:  c/o pain in hands and RLS- prev on Requip & weaned off in the past... she has mult somatic complaints & worries about her memory... we discussed MVI- Centrum + BComplex... plus restarting the REQUIP for her RLS symptoms.  ~  Dec10:  weight up 11# over the holidays... she notes incr in RLS symptoms and not improved w/ the 2mg  Requip Qhs... also notes some "shaking" intermittently in hands and upper torso... we discussed incr Requip to max 4mg  Qhs & refer to Neuro for eval of this & shaking... labs today>> show Hg down to 7.2, Fe= 28, and she denies overt signs of bleeding... we will proceed w/ Iron infusion Rx... she's already had the 2010 Flu vaccine.   ~  October 11, 2009:  saw DrDBrodie 1/11 w/ 2u Tx & given Iron infusion but I can't find documentation... had EGD 2/11 w/ GAVE- s/p argon laser ablation... pt tells me that DrBrodie plans monthly labs thru GI & close observation  w/ Q84mo ROVs...  she saw DrCopper for Cards f/u 1/11- palpit well controlled on Atenolol; hx non-obstructive CAD & good LVF; no change in Rx...  saw DrDohmeier for Neuro eval of RLS & tremor- RLS better w/ incr REQUIP to 4mg  Qhs, & tremor mild- not parkinsons, being followed...   ~  June 08, 2010:  c/o UTI symptoms w/ dark foul urine- we discussed UA, C&S, Rx w/ Cipro... 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 they checked her Hg=11.1, Fe=106 (stable), B12=472... unfortunately she has gained 15# up to 230# & notes incr SOB;  still very sedentary... she continues to see DrHejazi/ [name] for Psychiatry on 4 meds and stable.    Problem List:  COPD (ICD-496) & BRONCHITIS, RECURRENT (ICD-491.9) - Ex-smoker w/ underlying COPD on home O2, ADVAIR 250Bid, ALBUTEROL (via nebs or MDI for Prn use), & MUCINEX 1-2Bid... she has combined obstructive AND restrictive disease (based on her obesity) w/ diet (weight reduction) + exercise strongly recommended to the pt... [facility].  ~  CTChest 5/06 = neg.  ~  baseline CXR w/ bilat LL scarring, NAD.... last  CXR 3/09 = unchanged, NAD.  ~  PFTs 3/09 showed FVC= 2.47 (83%), FEV1= 1.65 (69%), %1sec= 67, mid-flows= 28%pred.  HYPERTENSION (ICD-401.9) - on ATENOLOL 50mg /d,  LASIX 40mg Bid & K20/d... BP=116/78 today, and she has mult somatic complaints but all are diminished compared to prev visits... denies bad HA's, CP, palpit, syncope, edema, etc...  CAD (ICD-414.00) - Hosp at [facility] 4/09 w/ CP... cath showed non-obstructive CAD... no CP, palpit, etc (but she is way too sedentary).  ~  cath 4/09 by DrCooper showed normLMAIN, 20% midLAD, norm CIRC, 20-30% proxRCA, EF= 50-55%...  PALPITATIONS (ICD-785.1) - eval by DrKlein w/ 7  beats WT on monitor... he changed Lisinopril to BBlocker (currently ATENOLOL 50mg /d) and improved...  AORTIC STENOSIS (ICD-424.1) - she has mild Ao Valve disease w/ AS/ AI... followed by DrCooper.  ~  2DEcho 11/06 showed mild ca++ AoV w/ mild AS, norm LVF w/ EF=55-65%  ~  repeat 2DEcho 1/09 showed similar mild ca++ AoV w/ mild AS, mild AI, no regional wall motion abn, norm LVf w/ EF= 60%... NOTE: est PAsys= 35...  ~  repeat 2DEcho 1/10 showed norm LVF w/ EF= 55-60%, no regional wall motion abn, mild DD, mod calcif AoV & mild reduced leaflet excursion & mild AI...  HYPERCHOLESTEROLEMIA (ICD-272.0) - on SIMVASTATIN 40mg /d...  ~  FLP 6/09 showed TChol 107, TG 69, HDL 35, LDL 59  ~  FLP 3/10 showed TChol 128, TG 125, HDL 33, LDL 70  ~  FLP 12/10 showed TChol 98, TG 64, HDL 37, LDL 48... continue same.  ~  FLP 10/11 showed TChol 124, TG 105, HDL 37, LDL 66  DM (ICD-250.00) - hx irreg control, not really on diet... on LANTUS 10u daily,  METFORMIN 500mg -2Bid,  GLIMEPIRIDE 4mg /d,  JANUVIA 100mg /d...   ~  labs 10/08 BS=198, HgA1C=7.2  ~  labs 1/09 showed BS=119, HgA1c=6.0  ~  labs 6/09 showed BS= 248, HgA1c= 7.0..[name] discussed need for starting insulin if she can't get on track and get wt down...  ~  labs 9/09 showed BS= 236, HgA1c= 7.6.Marland KitchenMarland Kitchen rec- start Lantus 5u/d... grad increased to 10u.  ~  labs 11/09 showed BS= 166, HgA1c= 6.0.Marland KitchenMarland Kitchen rec- great! keep same.  ~  labs 3/10 showed BS= 169, A1c= 6.8  ~  labs 12/10 showed BS= 149, A1c= 5.8, Umicroalb= neg.  ~  labs 10/11 showed BS= 215, A1c= 7.1.[name]. rec> better diet, get wt down, incr Lantus to 12u.  MORBID OBESITY (ICD-278.01) - not really on diet or exercise program...  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   ~  EGD  2/06 showed angiodysplasia & mult gastric pseudopolyps, watermelon stomach improved from 2003.  ~  recent f/u EGD 9/09 showed chr gastritis, no watermelon stomach & improved from prev...  ~  recurrent anemia 12/10 w/ f/u EGD 2/11 showing gastric antral avm's- s/p argon laser ablation, no varicies etc...  DIVERTICULOSIS OF COLON (ICD-562.10) & POLYP, COLON (ICD-211.3) -   ~  last colonoscopy 10/08 w/ adenomatous polyp removed, and angiodysplasia without hemorrhage  ~  CT Abd 10/08 showed Cirrhosis, borderline splenomeg, sl pancreatic atrophy & duct dil, sm umbil hernia, DJD sp, NAD...  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 last seen 2/10 w/ urge & stress incontinence + chr cystitis... no regular meds- f/u as needed.  ~  10/11:  presents w/ dysuria & UTI Rx w/ Cipro...  OSTEOARTHRITIS (ICD-715.90)  FIBROMYALGIA (ICD-729.1)  ? of OSTEOPOROSIS (ICD-733.00) - she had normal BMD in 2001 at [place]..  RESTLESS LEGS SYNDROME (ICD-333.94) - she prev weaned off the Requip...  ~  9/10: now c/o recurrent restless leg symptoms... restart REQUIP 2mg  & titrate up.  ~  12/10: persistant symptoms on 2mg Qhs... rec to incr to 4mg ...  ~  2/11: she reports resolution of RLS on 4mg  Requip Qhs, resting well now.  DYSTHYMIA (ICD-300.4) - Hx depression w/ psychotic features and hosp 2/09 at [facility] w/ delusions of pedunculosis... she was prev treated w/ RISPERDAL, Celexa, XANAX & Ambien by Dr. Bartholomew Crews & DrSuttenfield for counselling... Adm to [facility] in  May09 by DrBarbSmith w/ depression and meds adjusted (see DC summary)... now followed by [facility] and [name] at [facility]- she was re-admitted 10/09 and meds changed to RISPERDAL, PRISTIQUE, BUPROPION, NUVIGIL, & XANAX...  husb indicates that she is much better at present & continues outpt Rx...  ~  MRI/ MRA Brain 2/08 showed sm vessel dis & atherosclerotic changes in post circ...  ~  9/10: pt indicates that she is off the Trazodone... DrHejazi added Wellbutrin ?for her RLS?  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  ~  labs 04/18/08 by DrBrodie showed Hg= 10.4, Fe= 52... she ordered 1,200mg  IV Iron- given 04/26/08.  ~  labs 05/19/08 showed Hg= 11.3, Fe= 117...  ~  labs 11/09 showed Hg= 12.0.Marland KitchenMarland Kitchen  ~  labs 3/10 showed Hg= 12.3, Fe= 79  ~  labs 6/10 showed Hg= 11.5, Fe= 73  ~  labs 12/10 showed Hg= 7.2, Fe= 28 (6%sat)... given 2uPC & IV Fe per DrBrodie.  ~  labs 2/11 showed Hg= 11.8, Fe= 95... pt indicates that DrBrodie will be following labs going forward...  ~  labs 6/11 showed Hg= 11.2  ~  labs 9/11 showed Hg= 11.1, Fe= 106   Allergies (verified): 1)  ! Pcn 2)  ! Theophylline 3)  ! 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 Family History of Heart Disease: Mother, Maternal Grandfather No FH of Colon Cancer: Family History of Breast Cancer:Sister, maternal aunt x 2 Cervical Cancer: mother Family History of Diabetes:mother, father, sisters  Family History of  Kidney Disease:mother  Social History: Reviewed history from 05/08/2010 and no changes required. Married, 3 girls, 1 boy She quit smoking in 1994.  Smoked for 30 yrs. There is no history of excessive alcohol or illicit drug use.   She is a disabled.  Review of Systems      See HPI       The patient complains of weight gain, dyspnea on exertion, muscle weakness, difficulty walking, and depression.  The patient denies anorexia, fever, weight loss, vision loss, decreased hearing, hoarseness, chest pain, syncope, peripheral edema, prolonged cough, headaches, hemoptysis, abdominal pain, melena, hematochezia, severe indigestion/heartburn, hematuria, incontinence, suspicious skin lesions, transient blindness, unusual weight change, abnormal bleeding, enlarged lymph nodes, and angioedema.    Vital Signs:  Patient profile:   70 year old female Height:      64 inches Weight:      230.13 pounds BMI:     39.64 O2 Sat:      94 % on Room air Temp:     97.2 degrees F oral Pulse rate:   59 / minute BP sitting:   116 / 78  (left arm) Cuff size:   regular  Vitals Entered By: Gweneth Dimitri RN (June 08, 2010 10:39 AM)  O2 Flow:  Room air CC: 8 month ROV & review of mult medical problems... Comments Medications reviewed with patient Gweneth Dimitri RN  June 08, 2010 10:39 AM    Physical Exam  Additional Exam:  WD, Obese, 70 y/o WF in NAD... she is chr ill appearing... GENERAL:  Alert & oriented x 3... HEENT:  [place]/AT, sl pale conjuct, EOM-wnl, PERRLA, EACs-clear, TMs-wnl, NOSE-clear, THROAT-clear w/ dry MMs... NECK:  Supple w/  fairROM; no JVD; normal carotid impulses w/o bruits; no thyromegaly or nodules palpated; no lymphadenopathy. CHEST:  Clear to P & A; without wheezes/ rales/ or rhonchi heard... HEART:  Regular Rhythm;  gr 1/6 SEM without rubs or gallops... ABDOMEN:  Obese, soft & nontender w/ panniculus; normal bowel sounds; no organomegaly or masses detected. EXT: without deformities, mod arthritic changes; no varicose veins/ +venous insuffic/ tr edema. NEURO:  CN's intact;  no focal neuro deficits... DERM:  No lesions noted; no rash, pale complexion.    MISC. Report  Procedure date:  05/08/2010  Findings:      CBC Platelet w/Diff (CBCD)   White Cell Count          6.3 K/uL                    4.5-10.5   Red Cell Count       [L]  3.35 Mil/uL                 3.87-5.11   Hemoglobin           [L]  11.1 g/dL                   04.5-40.9   Hematocrit           [L]  33.0 %                      36.0-46.0   MCV                       98.4 fl                     78.0-100.0   Platelet Count       [  L]  121.0 K/uL                  150.0-400.0   Neutrophil %         [H]  80.2 %                      43.0-77.0   Lymphocyte %         [L]  8.5 %                       12.0-46.0   Monocyte %                8.4 %                       3.0-12.0   Eosinophils%              2.6 %                       0.0-5.0   Basophils %               0.3 %                       0.0-3.0  B12 + Folate Panel (B12/FOL)   Vitamin B12               472 pg/mL                   211-911   Folate                    >20.0 ng/mL  Ferritin (FER)   Ferritin                  27.8 ng/mL                  10.0-291.0  IBC Panel (IBC)   Iron                      106 ug/dL                   04-540   Transferrin               339.5 mg/dL                 981.1-914.7   Iron Saturation           22.3 %                      20.0-50.0   MISC. Report  Procedure date:  06/08/2010  Findings:      BMP (METABOL)   Sodium                    139 mEq/L                    135-145   Potassium                 3.7 mEq/L                   3.5-5.1   Chloride                  105 mEq/L  96-112   Carbon Dioxide            26 mEq/L                    19-32   Glucose              [H]  215 mg/dL                   16-10   BUN                       12 mg/dL                    9-60   Creatinine                0.9 mg/dL                   4.5-4.0   Calcium                   9.1 mg/dL                   9.8-11.9   GFR                       69.05 mL/min                >60  Hepatic/Liver Function Panel (HEPATIC)   Total Bilirubin           1.0 mg/dL                   1.4-7.8   Direct Bilirubin          0.3 mg/dL                   2.9-5.6   Alkaline Phosphatase      93 U/L                      39-117   AST                  [H]  39 U/L                      0-37   ALT                       35 U/L                      0-35   Total Protein             6.4 g/dL                    2.1-3.0   Albumin                   3.5 g/dL                    8.6-5.7   Lipid Panel (LIPID)   Cholesterol               124 mg/dL                   8-469   Triglycerides             105.0 mg/dL  0.0-149.0   HDL                  [L]  04.54 mg/dL                 >09.81   LDL Cholesterol           66 mg/dL                    1-91  TSH (TSH)   FastTSH                   2.85 uIU/mL                 0.35-5.50  Hemoglobin A1C (A1C)   Hemoglobin A1C       [H]  7.1 %                       4.6-6.5  Comments:      UDip w/Micro (URINE)   Color                     LT. YELLOW   Clarity                   SL CLOUDY                   Clear   Specific Gravity          1.010                       1.000 - 1.030   Urine Ph                  5.5                         5.0-8.0   Protein                   NEGATIVE                    Negative   Urine Glucose             NEGATIVE                    Negative   Ketones                   NEGATIVE                    Negative    Urine Bilirubin           NEGATIVE                    Negative   Blood                     NEGATIVE                    Negative   Urobilinogen              0.2                         0.0 - 1.0   Leukocyte Esterace        SMALL  Negative   Nitrite                   POSITIVE                    Negative   Urine WBC                 0-2/hpf                     0-2/hpf   Urine Bacteria            Few(10-50/hpf)              None   Impression & Recommendations:  Problem # 1:  COPD (ICD-496) Stable>  continue meds, needs to lose weight & incr exercise... Her updated medication list for this problem includes:    Advair Diskus 250-50 Mcg/dose Misc (Fluticasone-salmeterol) .[name]..[name] Two times a day    Proair Hfa 108 (90 Base) Mcg/act Aers (Albuterol sulfate) .[name]... 1-2 inhalations every 6 h as needed for wheezing...  Problem # 2:  HYPERTENSION (ICD-401.9) Controlled>  continue same meds. Her updated medication list for this problem includes:    Atenolol 50 Mg Tabs (Atenolol) .[name]... Take 1 tab by mouth once daily...    Lasix 40 Mg Tabs (Furosemide) .[name]... Take 1 by mouth two times a day  Orders: TLB-BMP (Basic Metabolic Panel-BMET) (80048-METABOL) TLB-Hepatic/Liver Function Pnl (80076-HEPATIC) TLB-Lipid Panel (80061-LIPID) TLB-TSH (Thyroid Stimulating Hormone) (84443-TSH) TLB-A1C / Hgb A1C (Glycohemoglobin) (83036-A1C) TLB-Udip w/ Micro (81001-URINE) T-Urine Culture (Spectrum Order) (04540-98119)  Problem # 3:  CAD (ICD-414.00) Stable> no angina, but she is too sedentary... needs incr exercise!!! Her updated medication list for this problem includes:    Atenolol 50 Mg Tabs (Atenolol) .[name]... Take 1 tab by mouth once daily...    Lasix 40 Mg Tabs (Furosemide) .[name]... Take 1 by mouth two times a day  Problem # 4:  AORTIC STENOSIS (ICD-424.1) Aware> stable, not progressive... Her updated medication list for this problem includes:    Atenolol 50 Mg Tabs (Atenolol) .[name]... Take 1 tab  by mouth once daily...  Problem # 5:  HYPERCHOLESTEROLEMIA (ICD-272.0) Stable on Simva40... Her updated medication list for this problem includes:    Simvastatin 40 Mg Tabs (Simvastatin) .[name]... Take 1 tab by mouth at bedtime for cholesterol  Problem # 6:  DM (ICD-250.00) BS & A1c are sl elevated, assoc w/ hwer weight gain... needs better diet & exercise, get weight down... I rec incr LANTUS to 12. Her updated medication list for this problem includes:    Lantus Solostar 100 Unit/ml Soln (Insulin glargine) ..... Inject 10 u daily each am..subcutaneously    Glucophage 500 Mg Tabs (Metformin hcl) .[name]... Take 2 by mouth two times a day    Glimepiride 4 Mg Tabs (Glimepiride) .[name]... 1 tab daily    Januvia 100 Mg Tabs (Sitagliptin phosphate) .[name]... Take 1 by mouth once daily  Problem # 7:  MORBID OBESITY (ICD-278.01) Weight reduction thru diet/ exercise are imperitive...  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   Problem # 10:  OTHER MEDICAL PROBLEMS AS NOTED>>>  Complete Medication List: 1)  Advair Diskus 250-50 Mcg/dose Misc (Fluticasone-salmeterol) .... Two times a day 2)  Proair Hfa 108 (90 Base) Mcg/act Aers (Albuterol sulfate) .[name].. 1-2 inhalations every 6 h  as needed for wheezing... 3)  Mucinex Dm 30-600 Mg Tb12 (Dextromethorphan-guaifenesin) .... Take 1 by mouth two times a day 4)  Atenolol 50 Mg Tabs (Atenolol) .... Take 1 tab by mouth once daily.Marland KitchenMarland Kitchen 5)  Lasix 40 Mg Tabs (Furosemide) .... Take 1 by mouth two times a day 6)  Klor-con M20 20 Meq Tbcr (Potassium chloride crys cr) .... Take 1 tablet by mouth once a day 7)  Simvastatin 40 Mg Tabs (Simvastatin) .... Take 1 tab by mouth at bedtime for cholesterol 8)  Lantus Solostar 100 Unit/ml Soln (Insulin glargine) .... Inject 10 u daily each  am..subcutaneously 9)  Glucophage 500 Mg Tabs (Metformin hcl) .... Take 2 by mouth two times a day 10)  Glimepiride 4 Mg Tabs (Glimepiride) .[name].. 1 tab daily 11)  Januvia 100 Mg Tabs (Sitagliptin phosphate) .... Take 1 by mouth once daily 12)  Omeprazole 20 Mg Tbec (Omeprazole) .... Take 1 by mouth two times a day 13)  Promethazine Hcl 25 Mg Tabs (Promethazine hcl) .[name].. 1 tab every 6 hours as needed for nausea 14)  Risperdal 3 Mg Tabs (Risperidone) .... Take 1/2 tab by mouth at bedtime as directed by drhejazi... 15)  Bupropion Hcl 150 Mg Xr12h-tab (Bupropion hcl) .... Take 1 tablet by mouth once a day as directed by dr. Wynonia Lawman... 16)  Pristiq 50 Mg Xr24h-tab (Desvenlafaxine succinate) .... Take 1 tablet by mouth once a day 17)  Nuvigil 150 Mg Tabs (Armodafinil) .... Take 1 tablet by mouth once a day 18)  Xanax 1 Mg Tabs (Alprazolam) .... Take 1 tablet by mouth two times a day as needed 19)  Requip 4 Mg Tabs (Ropinirole hcl) .... Take 1 tab by mouth at bedtime 20)  Centrum Silver Tabs (Multiple vitamins-minerals) .... Take 1 tab by mouth once daily... 21)  B Complex Tabs (B complex vitamins) .... Take 1 tab daily... 22)  Vitamin D 04540 Unit Caps (Ergocalciferol) .... Take one capsule each week 23)  Bd Pen Needle Short U/f 31g X 8 Mm Misc (Insulin pen needle) .... Use as directed 24)  Clotrimazole 1 % Crea (Clotrimazole) .... Apply to affected area two times a day 25)  Ciprofloxacin Hcl 250 Mg Tabs (Ciprofloxacin hcl) .... Take one tab by mouth two times a day til gone... 26)  Onetouch Ultra Blue Strp (Glucose blood) .... Test blood sugars as directed  Other Orders: Flu Vaccine 42yrs + MEDICARE PATIENTS (J8119) Administration Flu vaccine - MCR (J4782)  Patient Instructions: 1)  Today we updated your med list- see below.... 2)  We wrote a new perscription for CIPRO to take twice daily for your urinary infection.Marland KitchenMarland Kitchen 3)  Today we did your follow up FASTING blood work & urinalysis... please call  the "phone tree" in a few days for your lab results.Marland KitchenMarland Kitchen 4)  Let's get on track w/ our diet & exercise w/ the goal of losing 10-15 lbs over the next few months... 5)  Call for any questions.Marland KitchenMarland Kitchen 6)  Please schedule a follow-up appointment in 6 months. Prescriptions: ONETOUCH ULTRA BLUE  STRP (GLUCOSE BLOOD) test blood sugars as directed  #100 x 3   Entered by:   Gweneth Dimitri RN   Authorized by:   Michele Mcalpine MD   Signed by:   Gweneth Dimitri RN on 06/08/2010   Method used:   Electronically to        [facility] [phone]* (retail)       [address]  [place], [place]  [address]       Ph: [phone] or [phone]       Fax: [phone]   RxID:   [id number] CIPROFLOXACIN HCL 250 MG TABS (CIPROFLOXACIN HCL) take one tab by mouth two times a day til gone...  #14 x 1   Entered and Authorized by:   Michele Mcalpine MD   Signed by:   Michele Mcalpine MD on 06/08/2010   Method used:   Print then Give to Patient   RxID:   [id number]   Flu Vaccine Consent Questions     Do you have a history of severe allergic reactions to this vaccine? no    Any prior history of allergic reactions to egg and/or gelatin? no    Do you have a sensitivity to the preservative Thimersol? no    Do you have a past history of Guillan-Barre Syndrome? no    Do you currently have an acute febrile illness? no    Have you ever had a severe reaction to latex? no    Vaccine information given and explained to patient? yes    Are you currently pregnant? no    Lot Number:[id number]   Exp Date:02/23/2011   Site Given  Left Deltoid IMflu Reynaldo Minium CMA  June 08, 2010 12:10 PM

## 2010-09-27 NOTE — Letter (Signed)
Summary: [facility]  [facility]   Imported By: Lester Oradell 08/16/2010 11:50:18  _____________________________________________________________________  External Attachment:    Type:   Image     Comment:   External Document

## 2010-09-27 NOTE — Progress Notes (Signed)
Summary: samples  Phone Note Call from Patient   Caller: Patient Call For: nadel Summary of Call: samples of advair and junevia Initial call taken by: Rickard Patience,  January 29, 2010 11:26 AM  Follow-up for Phone Call        1 box Januvia 100mg  and Advair 250 left at front desk for pt to pick up.  Pt aware.  Gweneth Dimitri RN  January 29, 2010 11:41 AM

## 2010-09-27 NOTE — Letter (Signed)
Summary: Melvyn Novas MD  Melvyn Novas MD   Imported By: Lester Kanawha 08/23/2010 09:23:00  _____________________________________________________________________  External Attachment:    Type:   Image     Comment:   External Document

## 2010-09-27 NOTE — Procedures (Signed)
Summary: Upper Endoscopy  Patient: [name] Note: All result statuses are Final unless otherwise noted.  Tests: (1) Upper Endoscopy (EGD)   EGD Upper Endoscopy       DONE     [facility]     [address] [place], [place]  [address]           ENDOSCOPY PROCEDURE REPORT           PATIENT:  [name], [name]  MR#:  [id number]     BIRTHDATE:  05-27-1941, 68 yrs. old  GENDER:  female           ENDOSCOPIST:  Hedwig Morton. 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  The EG-[id number] ([id number]) endoscope was     introduced through the mouth and advanced to the second portion of     the duodenum, without limitations.  The instrument was slowly     withdrawn as the mucosa was fully examined.     <<PROCEDUREIMAGES>>           The esophagus and gastroesophageal junction were completely normal     in appearance (see image006). 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 small amount of avm's in the prepyloris     antrum, less extensive compared with 2006 Laser ablation was     performed (see image003, image004, and image001). 1l, 40 amp,     circumferential and straight probe used  Otherwise the examination     was normal (see image005 and image002).    Retroflexed views     revealed no abnormalities.    The scope was then withdrawn from     the patient and the procedure completed.           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           ______________________________     Hedwig Morton. Juanda Chance, MD           CC:           n.     eSIGNED:   Hedwig Morton. Tiphani Mells at 09/27/2009 09:28 AM  Shacarra, Choe, [phone]  Note: An exclamation mark (!) indicates a result that was not dispersed into the flowsheet. Document Creation Date: 09/27/2009 9:29 AM _______________________________________________________________________  (1) Order result status: Final Collection or observation date-time: 09/27/2009 09:14 Requested date-time:  Receipt date-time:  Reported date-time:  Referring Physician:   Ordering Physician: [name] [phone]) Specimen Source:  Source: Launa Grill Order Number: [id number] Lab site:   Appended Document: Upper Endoscopy Recall is in IDX for 09/2010.

## 2010-09-27 NOTE — Letter (Signed)
Summary: Diabetic Instructions  [facility]  [address] [place], [place] [address]   Phone: [phone]  Fax: [phone]    [name] March 30, 1941 MRN: [id number]   _x  _   ORAL DIABETIC MEDICATION INSTRUCTIONS  The day before your procedure:   Take your diabetic pill as you do normally  The day of your procedure:   Do not take your diabetic pill (Januvia)   We will check your blood sugar levels during the admission process and again in Recovery before discharging you home  ________________________________________________________________________  _ x _   INSULIN (LONG ACTING) MEDICATION INSTRUCTIONS (Lantus, NPH, 70/30, Humulin, Novolin-N)   The day before your procedure:   Take  your regular evening dose    The day of your procedure:   Do not take your morning dose

## 2010-09-27 NOTE — Progress Notes (Signed)
Summary: TRIAGE   Phone Note Call from Patient Call back at Home Phone [phone]   Call For: DR Juanda Chance Summary of Call: Having problems and wants appoinment  to be seen. Initial call taken by: Leanor Kail [facility],  August 28, 2009 9:03 AM  Follow-up for Phone Call           Pt. states her bowel habits changed 2 monthes ago, now having increased constipation, was instructed to use Miralax by Dr.Nadel, but she hasn't started it yet.    "I feel like something is laying on my lung on that left side" Some SOB.Pt. advised to speak with Dr.Nadel about this.     Pt. states she has increased nausea for 3 monthes, "I can think about dinner tonight and get nauseated" She uses Phenergan as needed.  1) See Dr.Brodie on 08-30-09 at 11:30am 2) Soft,bland diet. No spicy,greasy,fried foods.  3) Continue Phenergan as needed 4) Start Miralax as directed 5) Call Dr.Nadel about breathing c/o 6) If symptoms become worse call back immediately or go to ER.   Follow-up by: Laureen Ochs LPN,  August 28, 2009 9:23 AM     Appended Document: Orders Update   appt for iron infusion @[facility] stay 09/04/09@8 :00am pt is aware of this appt   Clinical Lists Changes  Orders: Added new Referral order of Misc. Referral (Misc. Ref) - Signed

## 2010-09-27 NOTE — Progress Notes (Signed)
Summary: samples  Phone Note Call from Patient Call back at Home Phone [phone]   Caller: Spouse//jerry Call For: [name] Summary of Call: Wants samples of advair 250-89mcg and januvia 100mg . Initial call taken by: Darletta Moll,  March 26, 2010 10:29 AM  Follow-up for Phone Call        1 sample of advair 250 and 1 box januvia 100mg  placed at front desk for pt to pick up -- pt aware.   Januvia 100mg  Lot # [id number], Exp date: 08/2011 Advair 250 Lot # [id number], Exp Date: 04/2011 Follow-up by: Gweneth Dimitri RN,  March 26, 2010 10:39 AM

## 2010-09-27 NOTE — Progress Notes (Signed)
Summary: rx  Phone Note Call from Patient Call back at Home Phone [phone]   Caller: Patient Call For: Jago Carton Reason for Call: Talk to Nurse Summary of Call: need to speak to nurse re: rx that is completely messed up.  Pt's Requip. Initial call taken by: Eugene Gavia,  January 24, 2010 9:46 AM  Follow-up for Phone Call        Pt states she thinks she was given the wrong amount of meds at her last refill because she is running out of pills. I called her pharmacy [facility] and she had requip last fille don 01-04-10 and received 30 tablets. I called pt back and she states she only has one pill left, she should have 12 tablets left. She is adament she has only taken one tablet at night. Pt is requesting and early refill on the requip. Please advise. Carron Curie CMA  January 24, 2010 2:04 PM [facility]  Additional Follow-up for Phone Call Additional follow up Details #1::        per SN---ok for early refill---this has been sent to pts pharmacy for refill.  pt is aware Randell Loop CMA  January 24, 2010 4:10 PM     Prescriptions: REQUIP 4 MG TABS (ROPINIROLE HCL) take 1 tab by mouth at bedtime  #30 x 2   Entered by:   Randell Loop CMA   Authorized by:   Michele Mcalpine MD   Signed by:   Randell Loop CMA on 01/24/2010   Method used:   [facility] to        [facility] [address] [phone]* (retail)       [address]       [place], [place]  [address]       Ph: [phone] or [phone]       Fax: [phone]   RxID:   [id number]

## 2010-09-27 NOTE — Progress Notes (Signed)
Summary: Supporting lab dx code  ---- Converted from flag ---- ---- 06/17/2010 4:33 PM, Michele Mcalpine MD wrote: please use [phone]- thanks, Scott  ---- 06/15/2010 8:44 AM, Leodis Liverpool wrote: Dx codes 401.9. 272.0, 250.0, 571.42 were denied for payment of urine culture, DOS 06/08/10, performed at [facility]. Could you send diagnosis code to resubmit for payment.  Thanks. Darl Pikes ------------------------------

## 2010-09-27 NOTE — Assessment & Plan Note (Signed)
Summary: 1 month follow up after iron infusion/recheck labs/la   Primary Care Provider:  Alroy Dust  CC:  6 week ROV & review of recent changes....  History of Present Illness: 70 y/o WF here for a follow up visit... she has mult med problems as outlined in my prev notes.   ~  Jan10:  continues to do well- had a good holiday & stable on her meds x overeating for Christmas... saw DrKlein in Dec09- hx palpit w/ 7 beat run of nonsustained VT on event recorder that correlated to her symptoms... he changed her Lisinopril to Propranolol LA 60mg /d & doing better at present... saw DrCooper for Cards f/u Jan10- 2DEcho showed norm LVF w/ EF= 55-60%, no regional wall motion abn, mild DD, mod calcif AoV & mild reduced leaflet excursion & mild AI... he changed Propranolol to Atenolol.  ~  Jun10:  states she's doing well x for arthritis- limited to Tylenol for relief due to GI hx etc... denies abd pain, change in BM's, etc...continues f/u w/ DrHejazi & [name] w/ meds adjusted- off Trazodone, they request fax of labs- Chol OK on Simva40, BS= 169 & A1c= 6.8, Hg= 12.3 & Fe= 79...  ~  Sep10:  c/o pain in hands and RLS- prev on Requip & weaned off in the past... she has mult somatic complaints & worries about her memory... we discussed MVI- Centrum + BComplex... plus restarting the REQUIP for her RLS symptoms.   ~  Dec10:  weight up 11# over the holidays... she notes incr in RLS symptoms and not improved w/ the 2mg  Requip Qhs... also notes some "shaking" intermittently in hands and upper torso... we discussed incr Requip to max 4mg  Qhs & refer to Neuro for eval of this & shaking... labs today>> show Hg down to 7.2, Fe= 28, and she denies overt signs of bleeding... we will proceed w/ Iron infusion Rx... she's already had the 2010 Flu vaccine.   ~  October 11, 2009:  saw DrDBrodie 1/11 w/ 2u Tx & given Iron infusion but I can't find documentation... had EGD 2/11 w/ GAVE- s/p argon laser ablation... pt tells me  that DrBrodie plans monthly labs thru GI & close observation w/ Q17mo ROVs...  she saw DrCopper for Cards f/u 1/11- palpit well controlled on Atenolol; hx non-obstructive CAD & good LVF; no change in Rx...  saw DrDohmeier for Neuro eval of RLS & tremor- RLS better w/ incr REQUIP to 4mg  Qhs, & tremor mild- not parkinsons, being followed...    Problem List:  COPD (ICD-496) & BRONCHITIS, RECURRENT (ICD-491.9) - Ex-smoker w/ underlying COPD on home O2, ADVAIR 250Bid, ALBUTEROL (via nebs or MDI for Prn use), & MUCINEX 1-2Bid... she has combined obstructive AND restrictive disease (based on her obesity) w/ diet (weight reduction) + exercise strongly recommended to the pt... [facility].  ~  baseline CXR w/ bilat LL scarring, NAD.... last CXR 3/09 = unchanged, NAD.  ~  CTChest 5/06 neg.  ~  PFTs 3/09 showed FVC= 2.47 (83%), FEV1= 1.65 (69%), %1sec= 67, mid-flows= 28%pred.  HYPERTENSION (ICD-401.9) - on ATENOLOL 50mg /d,  LASIX 40mg Bid & K20/d... BP=114/82 today, and she has mult somatic complaints but all are diminished compared to prev visits... denies bad HA's, CP, palpit, syncope, edema, etc...  CAD (ICD-414.00) - Hosp at [facility] 4/09 w/ CP... cath showed non-obstructive CAD...  ~  cath 4/09 by DrCooper showed normLMAIN, 20% midLAD, norm CIRC, 20-30% proxRCA, EF= 50-55%.Marland KitchenMarland Kitchen  PALPITATIONS (ICD-785.1) - eval by DrKlein w/ 7 beats WT on monitor... he changed Lisinopril to BBlocker (currently ATENOLOL 50mg /d) and improved...  AORTIC STENOSIS (ICD-424.1) - she has mild Ao Valve disease w/ AS/ AI... followed by DrCooper.  ~  2DEcho 11/06 showed mild ca++ AoV w/ mild AS, norm LVF w/ EF=55-65%  ~  repeat 2DEcho 1/09 showed similar mild ca++ AoV w/ mild AS, mild AI, no regional wall motion abn, norm LVf w/ EF= 60%... NOTE: est PAsys= 35...  ~  repeat 2DEcho 1/10 showed norm LVF w/ EF= 55-60%, no regional wall motion abn, mild DD, mod calcif AoV & mild reduced  leaflet excursion & mild AI...  HYPERCHOLESTEROLEMIA (ICD-272.0) - on SIMVASTATIN 40mg /d...  ~  FLP 6/09 showed TChol 107, TG 69, HDL 35, LDL 59  ~  FLP 3/10 showed TChol 128, TG 125, HDL 33, LDL 70  ~  FLP 12/10 showed TChol 98, TG 64, HDL 37, LDL 48... continue same.  DM (ICD-250.00) - hx irreg control, not really on diet... on LANTUS 10u daily,  METFORMIN 500mg -2Bid,  GLIMEPIRIDE 4mg /d,  JANUVIA 100mg /d...   ~  labs 10/08 BS=198, HgA1C=7.2  ~  labs 1/09 showed BS=119, HgA1c=6.0  ~  labs 6/09 showed BS= 248, HgA1c= 7.0..[name] discussed need for starting insulin if she can't get on track and get wt down...  ~  labs 9/09 showed BS= 236, HgA1c= 7.6.Marland KitchenMarland Kitchen rec- start Lantus 5u/d... grad increased to 10u.  ~  labs 11/09 showed BS= 166, HgA1c= 6.0.Marland KitchenMarland Kitchen rec- great! keep same.  ~  labs 3/10 showed BS= 169, A1c= 6.8  ~  labs 12/10 showed BS= 149, A1c= 5.8, Umicroalb= neg.  MORBID OBESITY (ICD-278.01) - not really on diet or exercise program...  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   ~  EGD  2/06 showed angiodysplasia & mult gastric pseudopolyps, watermelon stomach improved from 2003.  ~  recent f/u EGD 9/09 showed chr gastritis, no watermelon stomach & improved from prev...  ~  recurrent anemia 12/10 w/ f/u EGD 2/11 showing gastric antral avm's- s/p argon laser ablation, no varicies etc...  DIVERTICULOSIS OF COLON (ICD-562.10) & POLYP, COLON (ICD-211.3) -   ~  last colonoscopy 10/08 w/ adenomatous polyp removed, and angiodysplasia without hemorrhage  ~  CT Abd 10/08 showed Cirrhosis, borderline splenomeg, sl pancreatic atrophy & duct dil, sm umbil hernia, DJD sp,  NAD...  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 last seen 2/10 w/ urge & stress incontinence + chr cystitis... no regular meds- f/u as needed.  OSTEOARTHRITIS (ICD-715.90)  FIBROMYALGIA (ICD-729.1)  ? of OSTEOPOROSIS (ICD-733.00) - she had normal BMD in 2001 at [place]..  RESTLESS LEGS SYNDROME (ICD-333.94) - she prev weaned off the Requip...  ~  9/10: now c/o recurrent restless leg symptoms.Marland KitchenMarland Kitchen  restart REQUIP 2mg  & titrate up.  ~  12/10: persistant symptoms on 2mg Qhs... rec to incr to 4mg ...  ~  2/11: she reports resolution of RLS on 4mg  Requip Qhs, resting well now.  DYSTHYMIA (ICD-300.4) - Hx depression w/ psychotic features and hosp 2/09 at [facility] w/ delusions of pedunculosis... she was prev treated w/ RISPERDAL, CELEXA, XANAX & AMBIEN by Dr. Bartholomew Crews & DrSuttenfield for counselling... Adm to [facility] in May09 by DrBarbSmith w/ depression and meds adjusted (see DC summary)... now followed by [facility] and [name] at [facility]- she was re-admitted 10/09 and meds changed to RISPERDAL, PRISTIQUE, TRAZODONE, NUVIGIL, & XANAX... husb indicates that she is much better at present & continues out pt Rx...  ~  MRI/ MRA Brain 2/08 showed sm vessel dis & atherosclerotic changes in post circ...  ~  9/10: pt indicates that she is off the Trazodone... DrHejazi added Wellbutrin ?for her RLS?  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  ~  labs 04/18/08 by DrBrodie showed Hg= 10.4, Fe= 52... she ordered 1,200mg  IV Iron- given 04/26/08.  ~  labs 05/19/08 showed Hg=  11.3, Fe= 117...  ~  labs 11/09 showed Hg= 12.0.Marland KitchenMarland Kitchen  ~  labs 3/10 showed Hg= 12.3, Fe= 79  ~  labs 6/10 showed Hg= 11.5, Fe= 73  ~  labs 12/10 showed Hg= 7.2, Fe= 28 (6%sat)... given 2uPC & IV Fe per DrBrodie.  ~  labs 2/11 showed Hg= 11.8, Fe= 95... pt indicates that DrBrodie will be following labs going forward...   Allergies: 1)  ! Pcn 2)  ! Theophylline 3)  ! * Asprin  Comments:  Nurse/Medical Assistant: The patient's medications and allergies were reviewed with the patient and were updated in the Medication and Allergy Lists.  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 Family History of Heart Disease: Mother, Maternal Grandfather No FH of Colon Cancer: Family History of Breast Cancer:Sister, maternal aunt x 2 Cervical Cancer: mother Family History of Diabetes:mother, father, sisters  Family History of Kidney Disease:mother  Social History: Reviewed history from 09/06/2009 and no changes required.  She quit smoking 15 years ago.  There is no history of   excessive alcohol or illicit drug use.  She is a  disabled.     Review of Systems      See HPI       The patient complains of dyspnea on exertion, difficulty walking, and depression.  The patient denies anorexia, fever, weight loss, weight gain, vision loss, decreased hearing, hoarseness, chest pain, syncope, peripheral edema, prolonged cough, headaches, hemoptysis, abdominal pain, melena, hematochezia, severe indigestion/heartburn, hematuria, incontinence, muscle weakness, suspicious skin lesions, transient blindness, unusual weight change, abnormal bleeding, enlarged lymph nodes, and angioedema.    Vital Signs:  Patient profile:   70 year old female Height:      63 inches Weight:  216.13 pounds O2 Sat:      95 % on Room air Temp:     98.4 degrees F oral Pulse rate:   57 / minute BP sitting:   114 / 82  (left arm) Cuff size:   regular  Vitals Entered By: Randell Loop CMA (October 11, 2009 3:16 PM)  O2 Sat at Rest %:  95 O2 Flow:  Room air CC: 6 week ROV & review of recent changes... Comments no changes in meds today   Physical Exam  Additional Exam:  WD, Obese, 70 y/o WF in NAD... she is chr ill appearing... GENERAL:  Alert & oriented x 3... HEENT:  [place]/AT, sl pale conjuct, EOM-wnl, PERRLA, EACs-clear, TMs-wnl, NOSE-clear, THROAT-clear w/ dry MMs... NECK:  Supple w/ fairROM; no JVD; normal carotid impulses w/o bruits; no thyromegaly or nodules palpated; no lymphadenopathy. CHEST:  Clear to P & A; without wheezes/ rales/ or rhonchi heard... HEART:  Regular Rhythm;  gr 1/6 SEM without rubs or gallops... ABDOMEN:  Obese, soft & nontender w/ panniculus; normal bowel sounds; no organomegaly or masses detected. EXT: without deformities, mod arthritic changes; no varicose veins/ +venous insuffic/ tr edema. NEURO:  CN's intact;  no focal neuro deficits... DERM:  No lesions noted; no rash, pale complexion.    MISC. Report  Procedure date:  10/11/2009  Findings:      CBC Platelet w/Diff (CBCD)   White Cell Count           6.1 K/uL                    4.5-10.5   Red Cell Count       [L]  3.50 Mil/uL                 3.87-5.11   Hemoglobin           [L]  11.8 g/dL                   04.5-40.9   Hematocrit           [L]  34.7 %                      36.0-46.0   MCV                       99.0 fl                     78.0-100.0   MCHC                      34.1 g/dL                   81.1-91.4   RDW                  [H]  19.7 H %                    11.5-14.6   Platelet Count       [L]  111.0 K/uL                  150.0-400.0   Neutrophil %         [H]  79.1 %                      43.0-77.0   Lymphocyte %         [  L]  11.0 %                      12.0-46.0   Monocyte %                7.1 %                       3.0-12.0   Eosinophils%              2.7 %                       0.0-5.0   Basophils %               0.1 %                       0.0-3.0  Tests: (2) IBC Panel (IBC)   Iron                      95 ug/dL                    72-536   Transferrin               243.0 mg/dL                 644.0-347.4   Iron Saturation           27.9 %                      20.0-50.0   Impression & Recommendations:  Problem # 1:  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 pt indicates that DrBrodie will be monitoring her blood count monthly & checking her every 77mo going forward... 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 she was treated w/ NEXIUM Bid for 3 weeks, now back on OMEPRAZOLE Bid...  Problem # 3:  COPD (ICD-496) Stable-  no recent exac. Her updated medication list for this problem includes:    Advair Diskus 250-50 Mcg/dose Misc (Fluticasone-salmeterol) .[name]..[name] Two times a day    Proair Hfa 108 (90 Base) Mcg/act Aers (Albuterol sulfate) .[name]... 1-2 inhalations every 6 h as needed for  wheezing...  Problem # 4:  HYPERTENSION (ICD-401.9) Controlled-  same meds. Her updated medication list for this problem includes:    Atenolol 50 Mg Tabs (Atenolol) .[name]... Take 1 tab by mouth once daily...    Lasix 40 Mg Tabs (Furosemide) .[name]... Take 1 by mouth two times a day  Problem # 5:  CAD (ICD-414.00) She had f/u DrCooper-  stable, same meds. Her updated medication list for this problem includes:    Atenolol 50 Mg Tabs (Atenolol) .[name]... Take 1 tab by mouth once daily...    Lasix 40 Mg Tabs (Furosemide) .[name]... Take 1 by mouth two times a day  Problem # 6:  HYPERCHOLESTEROLEMIA (ICD-272.0) Stable on meds-  continue same. Her updated medication list for this problem includes:    Simvastatin 40 Mg Tabs (Simvastatin) .[name]... 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  Problem # 8:  OTHER MEDICAL PROBLEMS AS NOTED>>>  Complete Medication List: 1)  Advair Diskus 250-50 Mcg/dose Misc (Fluticasone-salmeterol) .... Two times a day 2)  Proair Hfa 108 (90 Base) Mcg/act Aers (Albuterol sulfate) .[name].. 1-2 inhalations every 6 h as needed for wheezing... 3)  Mucinex Dm 30-600 Mg Tb12 (Dextromethorphan-guaifenesin) .... Take 1 by mouth two times a day 4)  Atenolol 50 Mg Tabs (Atenolol) .... Take 1 tab by mouth once daily.Marland KitchenMarland Kitchen 5)  Lasix 40 Mg Tabs (Furosemide) .... Take 1 by mouth two times a day 6)  Klor-con M20 20 Meq Tbcr (Potassium chloride crys cr) .... Take 1 tablet by mouth once a day 7)  Simvastatin 40 Mg Tabs (Simvastatin) .... Take 1 tab by mouth at bedtime for cholesterol 8)  Lantus Solostar 100 Unit/ml Soln (Insulin glargine) .... Inject 10 u daily each am..subcutaneously 9)  Glucophage 500 Mg Tabs (Metformin hcl) .... Take 2 by mouth two times a day 10)  Glimepiride 4 Mg Tabs (Glimepiride) .[name].. 1 tab daily 11)  Januvia 100 Mg Tabs (Sitagliptin phosphate) .... Take 1 by mouth once daily 12)   Omeprazole 20 Mg Tbec (Omeprazole) .... Take 1 by mouth two times a day 13)  Promethazine Hcl 25 Mg Tabs (Promethazine hcl) .[name].. 1 tab every 6 hours as needed for nausea 14)  Imuran 50 Mg Tabs (Azathioprine) .... Take 1 by mouth once daily. 15)  Risperdal 3 Mg Tabs (Risperidone) .... Take 1/2 tab by mouth at bedtime as directed by drhejazi... 16)  Bupropion Hcl 150 Mg Xr12h-tab (Bupropion hcl) .... Take 1 tablet by mouth once a day as directed by dr. Wynonia Lawman... 17)  Pristiq 50 Mg Xr24h-tab (Desvenlafaxine succinate) .... Take 1 tablet by mouth once a day 18)  Nuvigil 150 Mg Tabs (Armodafinil) .... Take 1 tablet by mouth once a day 19)  Xanax 1 Mg Tabs (Alprazolam) .... Take 1 tablet by mouth two times a day as needed 20)  Requip 4 Mg Tabs (Ropinirole hcl) .... Take 1 tab by mouth at bedtime 21)  Centrum Silver Tabs (Multiple vitamins-minerals) .... Take 1 tab by mouth once daily... 22)  B Complex Tabs (B complex vitamins) .... Take 1 tab daily... 23)  Vitamin D 44010 Unit Caps (Ergocalciferol) .... Take one capsule each week 24)  Bd Pen Needle Short U/f 31g X 8 Mm Misc (Insulin pen needle) .... Use as directed  Patient Instructions: 1)  Today we updated your med list- see below.... 2)  Continue your current meds the same for now... 3)  Today we did your follow up blood count and Iron level... please call the "phone tree" in a few days for your lab results.Marland KitchenMarland Kitchen 4)  Keep your regular appts w/ DrDBrodie every 3 months as she indicated, and the GI blood work monthly... 5)  Call me for any questions.Marland KitchenMarland Kitchen 6)  Please schedule a follow-up appointment in 6 months, sooner as needed.

## 2010-09-27 NOTE — Progress Notes (Signed)
Summary: samples  Phone Note Call from Patient   Caller: Spouse Call For: Kriste Basque Reason for Call: Talk to Nurse Summary of Call: Pt's spouse called for samples, 2 boxes of Advair 250 and 3 packs of Januvia 100mg  left at front desk. Pt's spouse aware. Initial call taken by: Zackery Barefoot CMA,  August 28, 2010 10:04 AM

## 2010-09-27 NOTE — Assessment & Plan Note (Signed)
Summary: discuss medications/dn   History of Present Illness Visit Type: Follow-up Visit Primary GI MD: [name] MD Primary Provider: Alroy Dust, MD  Requesting Provider: na Chief Complaint: Discuss medications.  History of Present Illness:   This is a 70 year old white female with autoimmune liver disease, chronic GI blood loss due to portal hypertensive gastropathy, cirrhosis and splenomegaly. She is status post multiple argon laser ablations of AVMs. Her last treatment was in February 2010. She also has a history of angiodysplasia and adenomatous polyps of the colon on her last colonoscopy in October 2008. Her last upper abdominal ultrasound in June 2011 confirmed portal hypertension and cirrhosis. Her alpha-fetoprotein has been normal. Her Imuran has been discontinued because of nausea. She is feeling better off Imuran.   GI Review of Systems      Denies abdominal pain, acid reflux, belching, bloating, chest pain, dysphagia with liquids, dysphagia with solids, heartburn, loss of appetite, nausea, vomiting, vomiting blood, weight loss, and  weight gain.        Denies anal fissure, black tarry stools, change in bowel habit, constipation, diarrhea, diverticulosis, fecal incontinence, heme positive stool, hemorrhoids, irritable bowel syndrome, jaundice, light color stool, liver problems, rectal bleeding, and  rectal pain.    Current Medications (verified): 1)  Advair Diskus 250-50 Mcg/dose  Misc (Fluticasone-Salmeterol) .... Two Times A Day 2)  Proair Hfa 108 (90 Base) Mcg/act Aers (Albuterol Sulfate) .[name].. 1-2 Inhalations Every 6 H As Needed For Wheezing... 3)  Mucinex Dm 30-600 Mg  Tb12 (Dextromethorphan-Guaifenesin) .... Take 1 By Mouth Two Times A Day 4)  Atenolol 50 Mg Tabs (Atenolol) .... Take 1 Tab By Mouth Once Daily.Marland KitchenMarland Kitchen 5)  Lasix 40 Mg  Tabs (Furosemide) .... Take 1 By Mouth Two Times A Day 6)  Klor-Con M20 20 Meq Tbcr (Potassium Chloride Crys Cr) .... Take 1 Tablet By Mouth  Once A Day 7)  Simvastatin 40 Mg  Tabs (Simvastatin) .... Take 1 Tab By Mouth At Bedtime For Cholesterol 8)  Lantus Solostar 100 Unit/ml Soln (Insulin Glargine) .... Inject 10 U Daily Each Am..subcutaneously 9)  Glucophage 500 Mg  Tabs (Metformin Hcl) .... Take 2 By Mouth Two Times A Day 10)  Glimepiride 4 Mg  Tabs (Glimepiride) .[name].. 1 Tab Daily 11)  Januvia 100 Mg  Tabs (Sitagliptin Phosphate) .... Take 1 By Mouth Once Daily 12)  Omeprazole 20 Mg  Tbec (Omeprazole) .... Take 1 By Mouth Two Times A Day 13)  Promethazine Hcl 25 Mg Tabs (Promethazine Hcl) .[name].. 1 Tab Every 6 Hours As Needed For Nausea 14)  Risperdal 3 Mg Tabs (Risperidone) .... Take 1/2 Tab By Mouth At Bedtime As Directed By Drhejazi... 15)  Bupropion Hcl 150 Mg Xr12h-Tab (Bupropion Hcl) .... Take 1 Tablet By Mouth Once A Day As Directed By Dr. Wynonia Lawman... 16)  Pristiq 50 Mg Xr24h-Tab (Desvenlafaxine Succinate) .... Take 1 Tablet By Mouth Once A Day 17)  Nuvigil 150 Mg Tabs (Armodafinil) .... Take 1 Tablet By Mouth Once A Day 18)  Xanax 1 Mg  Tabs (Alprazolam) .... Take 1 Tablet By Mouth Two Times A Day As Needed 19)  Requip 4 Mg Tabs (Ropinirole Hcl) .... Take 1 Tab By Mouth At Bedtime 20)  Centrum Silver  Tabs (Multiple Vitamins-Minerals) .... Take 1 Tab By Mouth Once Daily... 21)  B Complex  Tabs (B Complex Vitamins) .... Take 1 Tab Daily... 22)  Vitamin D 16109 Unit Caps (Ergocalciferol) .... Take One Capsule Each Week 23)  Bd Pen  Needle Short U/f 31g X 8 Mm Misc (Insulin Pen Needle) .... Use As Directed  Allergies (verified): 1)  ! Pcn 2)  ! Theophylline 3)  ! 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 Family History of Heart Disease: Mother, Maternal Grandfather No FH of Colon Cancer: Family History of Breast Cancer:Sister, maternal aunt x 2 Cervical Cancer: mother Family History of Diabetes:mother, father, sisters  Family History of Kidney Disease:mother  Social History: Married, 3 girls, 1 boy She quit smoking 15 years ago.   There is no history of excessive alcohol or illicit drug use.   She is a disabled.     Review of Systems  The patient denies allergy/sinus, anemia, anxiety-new, arthritis/joint pain, back pain, blood in urine, breast changes/lumps, confusion, cough, coughing up blood, depression-new, fainting, fatigue, fever, headaches-new, hearing problems, heart murmur, heart rhythm changes, itching, menstrual pain, muscle pains/cramps, night sweats, nosebleeds, pregnancy symptoms, shortness of breath, skin rash, sleeping problems, sore throat, swelling of feet/legs, swollen lymph glands, thirst - excessive, urination - excessive, urination changes/pain, urine leakage, vision changes, and voice change.         Pertinent positive and negative review of systems were noted in the above HPI. All other ROS was otherwise negative.   Vital Signs:  Patient profile:   70 year old female Height:      63 inches Weight:      229 pounds BMI:     40.71 BSA:     2.05 Pulse rate:   60 / minute Pulse rhythm:    irregular BP sitting:   124 / 68  (left arm) Cuff size:   regular  Vitals Entered By: Ok Anis CMA (May 08, 2010 1:48 PM)  Physical Exam  General:  obese,alert and oriented. Eyes:  nonicteric. Mouth:  normal mucosa. Neck:  jugular veins not distended. Lungs:  Clear throughout to auscultation. Heart:  Regular rate and rhythm; no murmurs, rubs,  or bruits. Abdomen:  large, obese, protuberant abdomen slightly tender in the epigastrium with no fluid wave. Liver at the costal margin. Dermatitis under her breast. Rectal:  medium amount of Hemoccult positive yellow stool. Extremities:  no edema. Neurologic:  fine tremor but no asterixis.   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 She is currently stable. There is no evidence of ascites, encephalopathy or jaundice. She was nauseated on Imuran so we will hold it for now and repeat the liver function tests.  Problem # 2:  MORBID OBESITY (ICD-278.01) Patient has had a weight gain of 15 pounds since her last visit.  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 We will check her CBC and iron studies today and decide if she needs an iron or blood transfusion or repeat endoscopy with argon laser ablation of her AVMs.  Other Orders: TLB-CBC Platelet - w/Differential (85025-CBCD) TLB-B12 + Folate Pnl (37169_67893-Y10/FBP) TLB-Ferritin (82728-FER) TLB-IBC Pnl (Iron/FE;Transferrin) (83550-IBC) TLB-Hepatic/Liver Function Pnl (80076-HEPATIC)  Patient Instructions: 1)  Please go to the basement to have your labs drawn. 2)  Please pick up your prescriptions from the pharmacy. 3)  We will call you with further follow up pending your lab results 4)  CBC, liver function, anemia panel. 5)  Copy sent to :  Alroy Dust, MD 6)  The medication list was reviewed and reconciled.  All changed / newly prescribed medications were explained.  A complete medication list was provided to the patient / caregiver. Prescriptions: CLOTRIMAZOLE 1 % CREA (CLOTRIMAZOLE) apply to affected area two times a day  #15 grams x 0   Entered by:   Lamona Curl CMA (AAMA)   Authorized by:   Hart Carwin MD   Signed by:   Hart Carwin MD on 05/08/2010   Method used:   [facility] to        [facility] [address] [phone]* (retail)       [address]       [place], [place]  [address]       Ph: [phone] or [phone]       Fax: [phone]   RxID:   [id number] OMEPRAZOLE 20 MG  TBEC (OMEPRAZOLE) take 1 by mouth two times a day  #60 x 10   Entered by:   Lamona Curl CMA (AAMA)   Authorized by:   Hart Carwin MD   Signed by:   Hart Carwin MD on 05/08/2010   Method used:   [facility]        [facility] [address] [phone]* (retail)       [address]       [place], [place]  [address]       Ph: [phone] or [phone]       Fax: [phone]   RxID:   [id number]

## 2010-09-27 NOTE — Letter (Signed)
Summary: Colonoscopy Date Change Letter  [facility]  [address] [place], [place] [address]   Phone: [phone]  Fax: [phone]      May 02, 2010 MRN: [id number]   [facility] [address] [place], [place]  [address]   Dear Ms. Lamoreaux,   Previously you were recommended to have a repeat colonoscopy around this time. Your chart was recently reviewed by Dr. Hedwig Morton. Juanda Chance of [facility]. Follow up colonoscopy is now recommended in October 2015. This revised recommendation is based on current, nationally recognized guidelines for colorectal cancer screening and polyp surveillance. These guidelines are endorsed by the American Cancer Society, [employer] on Colorectal Cancer as well as numerous other major medical organizations.  Please understand that our recommendation assumes that you do not have any new symptoms such as bleeding, a change in bowel habits, anemia, or significant abdominal discomfort. If you do have any concerning GI symptoms or want to discuss the guideline recommendations, please call to arrange an office visit at your earliest convenience. Otherwise we will keep you in our reminder system and contact you 1-2 months prior to the date listed above to schedule your next colonoscopy.  Thank you,  Hedwig Morton. Juanda Chance, M.D.  [facility] [phone]

## 2010-09-27 NOTE — Progress Notes (Signed)
Summary: samples  Phone Note Call from Patient Call back at Home Phone [phone]   Caller: Spouse Call For: nadel Summary of Call: needs samples of advair 250 and januvia. Initial call taken by: Tivis Ringer, CNA,  November 27, 2009 10:17 AM  Follow-up for Phone Call        samples at front desk.  LM with family member informing her of this.  Family member stated she will relay message to pt.  Aundra Millet Reynolds LPN  November 28, 979 10:26 AM

## 2010-09-27 NOTE — Progress Notes (Signed)
Summary: TRIAGE-Monthly CBC Ordered  Phone Note Call from Patient Call back at Home Phone [phone]   Caller: Patient Call For: Juanda Chance Reason for Call: Talk to Nurse Summary of Call: Patient needs to set up an appt for labs (cbc) Initial call taken by: Tawni Levy,  October 02, 2009 3:48 PM  Follow-up for Phone Call        DR.Samie Reasons-How often does pt. need a CBC? Follow-up by: Laureen Ochs LPN,  October 02, 2009 3:53 PM  Additional Follow-up for Phone Call Additional follow up Details #1::        monthly CBC, please., start March1 Additional Follow-up by: Hart Carwin MD,  October 02, 2009 6:12 PM    Additional Follow-up for Phone Call Additional follow up Details #2::    Above MD orders reviewed with patient. Lab orders are in [facility] through 07/2010. Pt. instructed to call back as needed.  Follow-up by: Laureen Ochs LPN,  October 03, 2009 8:24 AM

## 2010-09-28 NOTE — Letter (Signed)
Summary: [facility]  [facility]   Imported By: Lester Empire 11/03/2009 09:22:58  _____________________________________________________________________  External Attachment:    Type:   Image     Comment:   External Document

## 2010-10-03 NOTE — Progress Notes (Signed)
Summary: samples  Phone Note Call from Patient Call back at Home Phone [phone]   Caller: Spouse//jerry Call For: nadel Summary of Call: Requests samples of advair diskus 250-15mcg and januvia 100mg . Initial call taken by: Darletta Moll,  September 26, 2010 8:17 AM  Follow-up for Phone Call        samples at front. pt aware. Carron Curie CMA  September 26, 2010 9:21 AM

## 2010-10-25 ENCOUNTER — Telehealth (INDEPENDENT_AMBULATORY_CARE_PROVIDER_SITE_OTHER): Payer: Self-pay | Admitting: *Deleted

## 2010-11-01 NOTE — Progress Notes (Signed)
Summary: patient needs samples of Advir 250/50 and Januvia  Phone Note Call from Patient   Caller: spouse [name] Call For: nadel Summary of Call: Spouse phoned and stated that she needed samples of Advir 250/50 and Januvia. They can be reached at [phone] Initial call taken by: Vedia Coffer,  October 25, 2010 11:49 AM  Follow-up for Phone Call        samples of Advair 250/50 and Januvia 100mg  left at front desk for pt to pick up. pt aware.  Arman Filter LPN  October 25, 4538 12:28 PM

## 2010-11-10 LAB — CROSSMATCH

## 2010-11-10 LAB — ABO/RH: ABO/RH(D): O POS

## 2010-11-14 LAB — CBC
HCT: 30 % — ABNORMAL LOW (ref 36.0–46.0)
Hemoglobin: 10 g/dL — ABNORMAL LOW (ref 12.0–15.0)
MCHC: 33.3 g/dL (ref 30.0–36.0)
MCV: 95.8 fL (ref 78.0–100.0)
RBC: 3.13 MIL/uL — ABNORMAL LOW (ref 3.87–5.11)
WBC: 3.3 10*3/uL — ABNORMAL LOW (ref 4.0–10.5)

## 2010-11-26 ENCOUNTER — Telehealth: Payer: Self-pay | Admitting: Pulmonary Disease

## 2010-11-26 NOTE — Telephone Encounter (Signed)
Advised pt advair and junevia was left up front for pick up. Pt verbalized understanding

## 2010-12-08 ENCOUNTER — Other Ambulatory Visit: Payer: Self-pay | Admitting: Internal Medicine

## 2010-12-10 ENCOUNTER — Encounter: Payer: Self-pay | Admitting: Pulmonary Disease

## 2010-12-11 ENCOUNTER — Other Ambulatory Visit (INDEPENDENT_AMBULATORY_CARE_PROVIDER_SITE_OTHER): Payer: Medicare Other

## 2010-12-11 ENCOUNTER — Other Ambulatory Visit (INDEPENDENT_AMBULATORY_CARE_PROVIDER_SITE_OTHER): Payer: Medicare Other | Admitting: Pulmonary Disease

## 2010-12-11 ENCOUNTER — Ambulatory Visit (INDEPENDENT_AMBULATORY_CARE_PROVIDER_SITE_OTHER): Payer: Medicare Other | Admitting: Pulmonary Disease

## 2010-12-11 ENCOUNTER — Ambulatory Visit (INDEPENDENT_AMBULATORY_CARE_PROVIDER_SITE_OTHER)
Admission: RE | Admit: 2010-12-11 | Discharge: 2010-12-11 | Disposition: A | Discharge status: Home / Self Care | Payer: Medicare Other | Source: Ambulatory Visit | Attending: Pulmonary Disease | Admitting: Pulmonary Disease

## 2010-12-11 ENCOUNTER — Encounter: Payer: Self-pay | Admitting: Pulmonary Disease

## 2010-12-11 DIAGNOSIS — F333 Major depressive disorder, recurrent, severe with psychotic symptoms: Secondary | ICD-10-CM

## 2010-12-11 DIAGNOSIS — J4489 Other specified chronic obstructive pulmonary disease: Secondary | ICD-10-CM

## 2010-12-11 DIAGNOSIS — J449 Chronic obstructive pulmonary disease, unspecified: Secondary | ICD-10-CM

## 2010-12-11 DIAGNOSIS — I359 Nonrheumatic aortic valve disorder, unspecified: Secondary | ICD-10-CM

## 2010-12-11 DIAGNOSIS — D649 Anemia, unspecified: Secondary | ICD-10-CM

## 2010-12-11 DIAGNOSIS — K754 Autoimmune hepatitis: Secondary | ICD-10-CM

## 2010-12-11 DIAGNOSIS — G2581 Restless legs syndrome: Secondary | ICD-10-CM

## 2010-12-11 DIAGNOSIS — I10 Essential (primary) hypertension: Secondary | ICD-10-CM

## 2010-12-11 DIAGNOSIS — N3090 Cystitis, unspecified without hematuria: Secondary | ICD-10-CM

## 2010-12-11 DIAGNOSIS — D500 Iron deficiency anemia secondary to blood loss (chronic): Secondary | ICD-10-CM

## 2010-12-11 DIAGNOSIS — I2510 Atherosclerotic heart disease of native coronary artery without angina pectoris: Secondary | ICD-10-CM

## 2010-12-11 DIAGNOSIS — N3000 Acute cystitis without hematuria: Secondary | ICD-10-CM

## 2010-12-11 DIAGNOSIS — K219 Gastro-esophageal reflux disease without esophagitis: Secondary | ICD-10-CM

## 2010-12-11 DIAGNOSIS — E119 Type 2 diabetes mellitus without complications: Secondary | ICD-10-CM

## 2010-12-11 DIAGNOSIS — E7800 Pure hypercholesterolemia, unspecified: Secondary | ICD-10-CM

## 2010-12-11 LAB — LIPID PANEL
LDL Cholesterol: 66 mg/dL (ref 0–99)
Total CHOL/HDL Ratio: 3
VLDL: 19 mg/dL (ref 0.0–40.0)

## 2010-12-11 LAB — HEPATIC FUNCTION PANEL
ALT: 16 U/L (ref 0–35)
AST: 39 U/L — ABNORMAL HIGH (ref 0–37)
Alkaline Phosphatase: 111 U/L (ref 39–117)
Bilirubin, Direct: 0.4 mg/dL — ABNORMAL HIGH (ref 0.0–0.3)
Total Bilirubin: 1.5 mg/dL — ABNORMAL HIGH (ref 0.3–1.2)

## 2010-12-11 LAB — CBC WITH DIFFERENTIAL/PLATELET
Eosinophils Absolute: 0.2 10*3/uL (ref 0.0–0.7)
Eosinophils Relative: 4.4 % (ref 0.0–5.0)
HCT: 32.1 % — ABNORMAL LOW (ref 36.0–46.0)
Lymphs Abs: 0.5 10*3/uL — ABNORMAL LOW (ref 0.7–4.0)
MCHC: 34.3 g/dL (ref 30.0–36.0)
MCV: 100.7 fl — ABNORMAL HIGH (ref 78.0–100.0)
Monocytes Absolute: 0.5 10*3/uL (ref 0.1–1.0)
Platelets: 126 10*3/uL — ABNORMAL LOW (ref 150.0–400.0)
WBC: 4.7 10*3/uL (ref 4.5–10.5)

## 2010-12-11 LAB — IBC PANEL: Iron: 118 ug/dL (ref 42–145)

## 2010-12-11 LAB — URINALYSIS, ROUTINE W REFLEX MICROSCOPIC
Hgb urine dipstick: NEGATIVE
Urine Glucose: NEGATIVE
Urobilinogen, UA: 1 (ref 0.0–1.0)

## 2010-12-11 LAB — BASIC METABOLIC PANEL
BUN: 17 mg/dL (ref 6–23)
Chloride: 105 mEq/L (ref 96–112)
GFR: 79.38 mL/min (ref >60.00)
Potassium: 4 mEq/L (ref 3.5–5.1)
Sodium: 141 mEq/L (ref 135–145)

## 2010-12-11 LAB — HEMOGLOBIN A1C: Hgb A1c MFr Bld: 5.8 % (ref 4.6–6.5)

## 2010-12-11 MED ORDER — LEVOFLOXACIN 750 MG PO TABS: 750.0000 mg | ORAL_TABLET | Freq: Every day | ORAL | Status: AC

## 2010-12-11 NOTE — Progress Notes (Signed)
Subjective:    Patient ID: [name], female    DOB: 1941-02-17, 70 y.o.   MRN: [id number]  HPI 70 y/o WF here for a follow up visit... she has mult med problems as noted below>   ~  October 11, 2009:  saw DrDBrodie 1/11 w/ 2u Tx & given Iron infusion but I can't find documentation... had EGD 2/11 w/ GAVE- s/p argon laser ablation... pt tells me that DrBrodie plans monthly labs thru GI & close observation w/ Q79mo ROVs...  she saw DrCopper for Cards f/u 1/11- palpit well controlled on Atenolol; hx non-obstructive CAD & good LVF; no change in Rx...  saw DrDohmeier for Neuro eval of RLS & tremor- RLS better w/ incr REQUIP to 4mg  Qhs, & tremor mild- not parkinsons, being followed...  ~  June 08, 2010:  c/o UTI symptoms w/ dark foul urine- we discussed UA, C&S, Rx w/ Cipro... 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 they checked her Hg=11.1, Fe=106 (stable), B12=472... unfortunately she has gained 15# up to 230# & notes incr SOB;  still very sedentary... she continues to see DrHejazi/ [name] for Psychiatry on 4 meds and stable.  ~  December 11, 2010:  43mo ROV w/ mult complaints including: sinus infection x2wks w/ pain & drainage; bladder infection w/ eval 12/11 by DrGrapey for chr cystitis & urge/ stress incont; wants new BS machine requesting "ultratouch" meter; had Neuro f/u DrDohmeier 12/11- tremors/ RLS/ hx hallucinbations> she tried Sinemet & Requip Rx, note reviewed...  She has SOB/ DOE w/ min activ but is too sedentary & not using her O2 despite O2 sat 87% on RA in office today (improved to 94% w/ rest)... Still on mult Psyche meds per [place] & [name]...  Labs due today> see below:          Problem List:  COPD (ICD-496) & BRONCHITIS, RECURRENT (ICD-491.9) - Ex-smoker w/ underlying COPD on  home O2, ADVAIR 250Bid, ALBUTEROL (via nebs or MDI for Prn use), & MUCINEX 1-2Bid... she has combined obstructive AND restrictive disease (based on her obesity) w/ diet (weight reduction) + exercise strongly recommended to the pt... [facility]. ~  CTChest 5/06 = neg. ~  baseline CXR w/ bilat LL scarring, NAD.... last CXR 3/09 = unchanged, NAD. ~  PFTs 3/09 showed FVC= 2.47 (83%), FEV1= 1.65 (69%), %1sec= 67, mid-flows= 28%pred. ~  CXR 4/12 showed bilat scarring, NAD...  HYPERTENSION (ICD-401.9) - on ATENOLOL 50mg /d,  LASIX 40mg Bid & K20/d...  ~  10/11:  BP=116/78 and she has mult somatic complaints but all are diminished compared to prev visits... denies bad HA's, CP, palpit, syncope, edema, etc... ~  4/12:  BP= 140/60 and she persists w/ mult somatic complaints...  CAD (ICD-414.00) - Hosp at [facility] 4/09 w/ CP... cath showed non-obstructive CAD... no CP, palpit, etc (but she is way too sedentary). ~  cath 4/09 by DrCooper showed normLMAIN, 20% midLAD, norm CIRC, 20-30% proxRCA, EF= 50-55%...  PALPITATIONS (ICD-785.1) - eval by DrKlein w/ 7 beats WT on monitor... he changed Lisinopril to BBlocker (currently ATENOLOL 50mg /d) and improved...  AORTIC STENOSIS (ICD-424.1) - she has mild Ao Valve disease w/ AS/ AI... followed by DrCooper. ~  2DEcho 11/06 showed mild ca++ AoV w/ mild AS, norm LVF w/ EF=55-65% ~  repeat 2DEcho 1/09 showed similar mild ca++ AoV w/ mild AS, mild AI, no regional wall motion abn, norm LVf w/ EF= 60%... NOTE: est PAsys= 35... ~  repeat 2DEcho 1/10 showed norm LVF w/ EF= 55-60%, no regional wall motion abn, mild DD, mod calcif AoV & mild reduced leaflet excursion & mild AI...  HYPERCHOLESTEROLEMIA (ICD-272.0) - on SIMVASTATIN 40mg /d... ~  FLP 6/09 showed TChol 107, TG 69, HDL 35, LDL 59 ~  FLP 3/10 showed TChol 128, TG 125, HDL 33, LDL 70 ~  FLP 12/10 showed TChol 98, TG 64, HDL 37, LDL 48... continue same. ~  FLP 10/11  showed TChol 124, TG 105, HDL 37, LDL 66 ~  FLP 4/12 showed TChol 122, TG 95, HDL 37, LDL 66  DM (ICD-250.00) - hx irreg control, not really on diet... on LANTUS 10u daily,  METFORMIN 500mg -2Bid,  GLIMEPIRIDE 4mg /d,  JANUVIA 100mg /d...  ~  labs 10/08 BS=198, HgA1C=7.2 ~  labs 1/09 showed BS=119, HgA1c=6.0 ~  labs 6/09 showed BS= 248, HgA1c= 7.0..[name] discussed need for starting insulin if she can't get on track and get wt down... ~  labs 9/09 showed BS= 236, HgA1c= 7.6.Marland KitchenMarland Kitchen rec- start Lantus 5u/d... grad increased to 10u. ~  labs 11/09 showed BS= 166, HgA1c= 6.0.Marland KitchenMarland Kitchen rec- great! keep same. ~  labs 3/10 showed BS= 169, A1c= 6.8 ~  labs 12/10 showed BS= 149, A1c= 5.8, Umicroalb= neg. ~  labs 10/11 showed BS= 215, A1c= 7.1.[name]. rec> better diet, get wt down. ~  Labs 4/12 showed BS= 172, A1c=5.8  MORBID OBESITY (ICD-278.01) - not really on diet or exercise program... 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  ~  EGD  2/06 showed angiodysplasia & mult gastric pseudopolyps, watermelon stomach improved from 2003. ~  recent f/u EGD 9/09 showed chr gastritis, no watermelon stomach & improved from prev... ~  recurrent anemia 12/10 w/ f/u EGD 2/11 showing gastric antral avm's- s/p argon laser ablation, no varicies etc...  DIVERTICULOSIS OF COLON (ICD-562.10) & POLYP, COLON (ICD-211.3) -  ~  last colonoscopy 10/08 w/ adenomatous polyp removed, and angiodysplasia without hemorrhage ~  CT Abd 10/08 showed Cirrhosis, borderline splenomeg, sl pancreatic atrophy & duct dil, sm umbil hernia, DJD sp, NAD...  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 ~  LFTs 11/09 = WNL ~  LFTs 3/10 = WNL ~  LFTs 12/10 = WNL ~  LFTs 10/11 showed SGOT=39 (0-37), SGPT=35 (0-35) ~  LFTs 4/12 showed SGOT=39, SGPT=16  Hx of CYSTITIS, RECURRENT (ICD-595.9) - hx mult UTI's w/ eval by DrGrapey for Urology... ~  10/11:  presents w/ dysuria & UTI Rx w/ Cipro... ~  12/11:  She had f/u DrGrapey...  OSTEOARTHRITIS (ICD-715.90)  FIBROMYALGIA (ICD-729.1)  ? of OSTEOPOROSIS (ICD-733.00) - she had normal BMD in 2001 at [facility]..  RESTLESS LEGS SYNDROME (ICD-333.94) - she prev weaned off the Requip... ~  9/10: now c/o recurrent restless leg symptoms... restart REQUIP 2mg  &  titrate up. ~  12/10: persistant symptoms on 2mg Qhs... rec to incr to 4mg ... ~  2/11: she reports resolution of RLS on 4mg  Requip Qhs, resting well now. ~  12/11:  Recurrent RLS symptoms despite the Requip & seen by DrDohmeier w/ addition of Sinemet 25/100 Bid...  DYSTHYMIA (ICD-300.4) - Hx depression w/ psychotic features and hosp 2/09 at [facility] w/ delusions of pedunculosis... she was prev treated w/ RISPERDAL, Celexa, XANAX & Ambien by Dr. Bartholomew Crews & DrSuttenfield for counselling... Adm to [facility] in May09 by DrBarbSmith w/ depression and meds adjusted (see DC summary)... now followed by [facility] and [name] at [facility]- she was re-admitted 10/09 and meds changed to RISPERDAL, PRISTIQUE, BUPROPION, NUVIGIL, & XANAX...  husb indicates that she is much better at present & continues outpt Rx... ~  MRI/ [facility]RA Brain 2/08 showed sm vessel dis & atherosclerotic changes in post circ... ~  9/10: pt indicates that she is off the Trazodone... DrHejazi added Wellbutrin ?for her RLS?  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 ~  labs 04/18/08 by DrBrodie showed Hg= 10.4, Fe= 52... she ordered 1,200mg  IV Iron- given 04/26/08. ~  labs 05/19/08 showed Hg= 11.3, Fe= 117... ~  labs 11/09 showed Hg= 12.0.Marland KitchenMarland Kitchen ~  labs 3/10 showed Hg= 12.3, Fe= 79 ~  labs 6/10 showed Hg= 11.5, Fe= 73 ~  labs 12/10 showed Hg= 7.2, Fe= 28 (6%sat)... given 2uPC & IV Fe per DrBrodie. ~  labs 2/11 showed Hg= 11.8, Fe= 95... pt indicates that DrBrodie will be following labs going forward... ~  labs 6/11 showed Hg= 11.2 ~  labs 9/11 showed Hg= 11.1, Fe= 106 ~  Labs 4/12 showed Hg=11.0, Fe= 118   Past Surgical History  Procedure Date  . Hemorroidectomy   . Vaginal hysterectomy   . Appendectomy     Outpatient Encounter Prescriptions as of 12/11/2010  Medication Sig Dispense Refill  . albuterol (PROAIR HFA) 108 (90 BASE) MCG/ACT inhaler Inhale 2 puffs into the lungs every 6 (six) hours as needed.        . ALPRAZolam (XANAX) 1 MG tablet Take 1 mg by mouth at bedtime as needed.        . Armodafinil (NUVIGIL) 150 MG tablet Take 150 mg by mouth daily.        [name] atenolol (TENORMIN) 50 MG tablet Take 50 mg by mouth daily.        [name] b complex vitamins tablet Take 1 tablet by mouth daily.        [name] buPROPion (ZYBAN) 150 MG 12 hr tablet Take 150 mg by mouth daily.        . clotrimazole (LOTRIMIN) 1 % cream Apply topically 2 (two) times daily.        [name] desvenlafaxine (PRISTIQ) 50 MG 24 hr tablet Take 50 mg by mouth daily.        . ergocalciferol (VITAMIN D2) 50000 UNITS capsule Take 50,000 Units by mouth once a week.        . Fluticasone-Salmeterol (ADVAIR DISKUS) 250-50 MCG/DOSE AEPB Inhale 1 puff into the lungs every 12 (twelve) hours.        . furosemide (LASIX) 40 MG tablet Take 40 mg by mouth 2 (two) times daily.        [name] glimepiride (AMARYL) 4 MG tablet Take 4 mg by mouth daily before breakfast.        .  glucose blood test strip 1 each by Other route as needed. Use as instructed       . guaiFENesin (MUCINEX) 600 MG 12 hr tablet Take 1,200  mg by mouth 2 (two) times daily.        . insulin glargine (LANTUS) 100 UNIT/ML injection Inject 10 Units into the skin at bedtime.        . Insulin Pen Needle (B-D ULTRAFINE III SHORT PEN) 31G X 8 MM MISC by Does not apply route.        . metFORMIN (GLUCOPHAGE) 500 MG tablet Take 2 two times a day       . Multiple Vitamins-Minerals (CENTRUM SILVER PO) Take 1 tablet by mouth daily.        [name] omeprazole (PRILOSEC) 20 MG capsule Take 20 mg by mouth 2 (two) times daily.        . potassium chloride SA (K-DUR,KLOR-CON) 20 MEQ tablet Take 20 mEq by mouth daily.        . promethazine (PHENERGAN) 25 MG tablet Take 25 mg by mouth every 6 (six) hours as needed.        . risperiDONE (RISPERDAL) 3 MG tablet Take by mouth. Take 1/2 at bedtime       . simvastatin (ZOCOR) 40 MG tablet Take 40 mg by mouth at bedtime.        . sitaGLIPtan (JANUVIA) 100 MG tablet Take 100 mg by mouth daily.        [name] rOPINIRole (REQUIP) 4 MG tablet Take 4 mg by mouth at bedtime.          Allergies  Allergen Reactions  . Aspirin     Causes nosebleeds  . Penicillins     REACTION: itching  . Theophylline     REACTION: nausea and vomiting    Review of Systems         See HPI - all other systems neg except as noted...      The patient complains of weight gain, dyspnea on exertion, muscle weakness, difficulty walking, and depression.  The patient denies anorexia, fever, weight loss, vision loss, decreased hearing, hoarseness, chest pain, syncope, peripheral edema, prolonged cough, headaches, hemoptysis, abdominal pain, melena, hematochezia, severe indigestion/heartburn, hematuria, incontinence, suspicious skin lesions, transient blindness, unusual weight change, abnormal bleeding, enlarged lymph nodes, and angioedema.     Objective:   Physical Exam     WD, Obese, 70 y/o WF in NAD... she is chr ill appearing... GENERAL:  Alert & oriented x 3... HEENT:  [place]/AT, sl pale conjuct, EOM-wnl, PERRLA, EACs-clear, TMs-wnl, NOSE-clear,  THROAT-clear w/ dry MMs... NECK:  Supple w/ fairROM; no JVD; normal carotid impulses w/o bruits; no thyromegaly or nodules palpated; no lymphadenopathy. CHEST:  Clear to P & A; without wheezes/ rales/ or rhonchi heard... HEART:  Regular Rhythm;  gr 1/6 SEM without rubs or gallops... ABDOMEN:  Obese, soft & nontender w/ panniculus; normal bowel sounds; no organomegaly or masses detected. EXT: without deformities, mod arthritic changes; no varicose veins/ +venous insuffic/ tr edema. NEURO:  CN's intact;  no focal neuro deficits... DERM:  No lesions noted; no rash, pale complexion.   Assessment & Plan:   COPD>  Continue Advair, NEBS, O2, Mucinex...  HBP>  Controlled on BBlocker, diuretic> continue same...  CAD>  Known nonobstructive dis, way too sedentary> we discussed diet + exercise...  AS>  Stable w/o angina, syncope, etc...  CHOL>  Stable on the Simva40...  DM>  Stable on the Lantus + meds, must  get wt down...  OBESITY>  Reviewed diet + exercise needed...  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  Autoimmune hepatitis>  Also managed by DrBrodie, off the Immuran at present...  RLS>  Notes from DrDohmeier reviewed> on Sinemet + Requip...  Dysthymia>  follwed by Psyche on mult meds...  Anemia>  Current Fe level holding up OK since her last Fe infusion 1/12 per GI.Marland KitchenMarland Kitchen

## 2010-12-11 NOTE — Patient Instructions (Signed)
Today we updated your meds in our EPIC system...    Continue your current meds the same...    We wrote a new prescription for [facility] to take one daily til gone for your infections...  Today we did your follow up CXR, Urinalysis, & fasting blood work...    Please call the PHONE TREE in a few days for your results...    Dial [id number] & when prompted enter your patient number followed by the # symbol...    Your patient number is:  [id number]#  Let's get on track w/ our exercise program as we discussed... Keep up the good work w/ weight reduction... Call for any questions... Let's plan a follow up visit in another 6 months, sooner if needed.Marland KitchenMarland Kitchen

## 2010-12-18 ENCOUNTER — Other Ambulatory Visit: Payer: Self-pay | Admitting: Pulmonary Disease

## 2010-12-26 ENCOUNTER — Telehealth: Payer: Self-pay | Admitting: Pulmonary Disease

## 2010-12-26 NOTE — Telephone Encounter (Signed)
Pt aware 2 Advair 250/50 samples and 2 Januvia 100 mg samples left for her to pick up.

## 2010-12-30 ENCOUNTER — Encounter: Payer: Self-pay | Admitting: Pulmonary Disease

## 2011-01-08 NOTE — H&P (Signed)
[name], SING NO.:  [id number]   MEDICAL RECORD NO.:  [id number]          PATIENT TYPE:  IPS   LOCATION:  0301                          FACILITY:  [facility]   PHYSICIAN:  Jasmine Pang, M.D. DATE OF BIRTH:  03-06-1941   DATE OF ADMISSION:  01/12/2008  DATE OF DISCHARGE:                       PSYCHIATRIC ADMISSION ASSESSMENT   IDENTIFICATION:  A 70 year old white female who is married.  This is a  voluntary admission.   HISTORY OF PRESENT ILLNESS:  [facility] admission for this 70 year old  who presented in the emergency room complaining that she has been  getting gradually more depressed since she was discharged from the  hospital a couple of months ago.  She was apparently admitted to [facility] in February for depression and  because she was seeing bugs.  She reports she was discharged on the  28th, having had her Prozac discontinued and was started on Celexa 40 mg  daily at that time.  She admits having a lot of trouble keeping up with  her medications and keeping everything straight.  She could not state  reliably that she has been taking any of her medications real  consistently.  Says that she gets very confused by her medication boxes.  Also endorses a lot of loneliness, being home alone all day, and some  chronic conflict with her children.  She denies active suicidal thoughts  today but feels like giving up.  Feels like she just has no reason to go  on.  No homicidal thoughts, denying any hallucinations at this time.   PAST PSYCHIATRIC HISTORY:  The patient was recently scheduled to see Dr.  Bartholomew Crews at [facility] outpatient psychiatry department but  reports she did not keep her followup appointment. T This is her first  [facility] admission.  She  saw Dr. [name] in [facility] for psychiatry several  years ago.  Also has seen Abel Presto at [facility] outpatient clinic in the  past for counseling.  Reports being  discharged from [facility]  October 24, 2007 for depression and seeing bugs.  She reports a  history of chronic depression for 34 years on multiple medications.  No  prior history of suicide attempts.  No history of substance abuse.   SOCIAL HISTORY:  A 70 year old white female, retired. Currently living  at home with her husband.  Born and raised in [place], [place].  Married for the past 19 years.  She has 4 children of her own  and 3 additional step-children from her husband's previous relationship.  He is currently working full-time.  She does have 1 daughter living in  [place] and 2 daughters in [place].  The others live far away.   FAMILY HISTORY:  She denies family history of mental illness or  substance abuse.   PRIMARY CARE PHYSICIAN:  The patient is followed by Dr. Alroy Dust, her  pulmonologist and also primary care practitioner, and by Dr. [name], her gastroenterologist.   PAST MEDICAL HISTORY:  1. COPD.  2. Autoimmune hepatitis with resulting cirrhosis of the liver.  3. Morbid obesity.  4. GERD.  5. Diverticulosis.  6. Hypertension.  7. Anemia, NOS.   CURRENT MEDICATIONS:  1. Continuous oxygen via nasal cannula at 3 liters per minute.  2. Lasix 40 mg twice daily.  3. Albuterol MDI 2 puffs q.4h. p.r.n. asthma.  4. Advair Diskus 250/50 1 puff b.i.d.  5. Enteric-coated aspirin 81 mg daily.  6. Imuran 50 mg daily.  7. Xanax, dose unclear.  8. Temazepam, dose unclear.  9. At one point was on Wellbutrin.  I am not sure if she is currently      taking it.  10.Potassium 20 mEq daily.  11.Celexa 40 mg daily.  12.Ambien 10 mg q.h.s. p.r.n., and she was apparently alternating this      with either temazepam or Xanax.  13.Requip 1 mg daily.  14.Risperdal 2 mg at bedtime.  15.Glimepiride 4 mg q.a.m.  16.Lisinopril 5 mg daily.  17.Metformin 1000 mg b.i.d.  18.Zocor 40 mg daily.  19.Januvia 100 mg daily at supper.  20.Omeprazole 20 mg  daily.  21.Also previously on promethazine 25 mg q.6h. p.r.n. for nausea.   ALLERGIES:  PENICILLIN AND THEOPHYLLINE.  REACTION TO ASPIRIN IS  UNCLEAR.   PHYSICAL EXAMINATION:  GENERAL:  In the emergency room, this is an obese  white female who is well-groomed today in no distress, pleasant,  cooperative.  VITAL SIGNS:  5 feet 5 inches tall, 227 pounds.  Temperature 97.8, pulse  51, respirations 18, blood pressure 131/58.   DIAGNOSTIC STUDIES:  Remarkable for a urinalysis with a glucose of 100  mg percent.  Urine drug screen was positive for benzodiazepines.  Chemistry and liver enzymes were within normal limits and hemoglobin at  9.9.   MENTAL STATUS EXAM:  Fully alert female, well-groomed, with makeup on.  Appropriate affect.  She does appear depressed, mildly irritable in  affect but cooperative.  Gives a coherent history.  Speech is normal.  Mood is depressed.  Thought process is logical and coherent.  No flight  of ideas.  No evidence of hallucinations today.  She is denying any  hallucinations subjectively.  She does not appear internally distracted.  Thinking is linear, goal-directed.  She does talk about feeling quite  lonely and isolated.  Her children have lives of their own.  She feels  she has little support at home.  Husband is gone and works all day, and  she has a lot of concerns and worries about her multiple medical  problems.  Denying active suicidal or homicidal thoughts today.  Cognition is intact.  She is oriented x3.  Insight adequate.  Impulse  control and judgment within normal limits.  Immediate, recent, and  remote memory are intact.   AXIS I:  Major depression, not otherwise specified.   AXIS II:  Deferred.   AXIS III:  Obesity, chronic obstructive pulmonary disease, hepatic  cirrhosis secondary to autoimmune hepatitis, gastroesophageal reflux  disease, diverticulosis, hypertension, and anemia, not otherwise  specified.   AXIS IV:  Significant social  isolation and severe stress with multiple  medical problems.   AXIS V:  Current 42, past year 65 estimated.   PLAN:  Voluntarily admit the patient with every 15-minute checks in  place.  We have enrolled her in our depression support group.  She has  been cooperative with staff, and we are going to call her primary care  practitioner who has been managing her depression, Dr. Alroy Dust, to  coordinate with him.  She is on multiple  medications, and at this point  we are going to attempt to simplify her medication routine.  We will  resume the Celexa 40 mg daily and discontinue the ropinirole since it  has caused her hallucinations in the past.  We are going to discontinue  the Ambien also.  Continue her on Celexa 40 mg daily.  We will also stop  the Wellbutrin 300 mg XL at this time.  I am not sure if she was taking  this previously.  Continue Risperdal 1 mg p.o. q.h.s. and 1 mg of Xanax  at bedtime and 0.5 mg b.i.d.   ESTIMATED LENGTH OF STAY:  3-4 days.  We will try to get a family  session with her husband.  She has voiced her agreement with the plan.      Margaret A. Lorin Picket, N.P.      Jasmine Pang, M.D.  Electronically Signed    MAS/MEDQ  D:  01/12/2008  T:  01/12/2008  Job:  [id number]

## 2011-01-08 NOTE — Letter (Signed)
July 29, 2008    [name]. Eden Emms, MD, [facility]  [address]  [address]  [place], [place] [address]   RE:  [name], [name]  MRN:  [id number]  /  DOB:  May 06, 1941   Dear Theron Arista:   It was pleasure to seeing [name] today in followup for the  palpitations.  An event recorder demonstrated an episode of 7 beats of  nonsustained ventricular tachycardia that correlated with her symptoms.  She does as you know  have a structurally fairly normal heart, having  undergone catheterization in the spring and echo in the last winter  demonstrating normal left ventricular function, mild aortic stenosis.  She had a monitor on for about 10 days before she was hospitalized for  behavioral health.  At that time, renew psychotropic medications were  initiate including Nuvigil, risperidone, and Pristiq.  It has been her  impression that her palpitations mostly have been worse.   The rest of her past medical history is not explore very deeply at this  point.  She does have a history of high blood pressure and she knows  that she has had a chronic dry cough.   Her medication list is legion and is not recounted here.  It is in E-  chart.   On examination, her blood pressure was 100/60.  Her pulse was 84.  Her  weight was 224.  Her lungs were clear.  Heart sounds were regular with a  2/6 high pitched murmur.  The abdomen was soft.  The extremities were  without edema.   Review of the event recorder was as noted above.   IMPRESSION:  1. Nonsustained ventricular tachycardia.  2. Structurally normal heart by echocardiogram apart from mild aortic      sclerosis/stenosis and normal left ventricular function.  3. 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  4. Diabetes.  5. Hypertension.  6. Chronic cough, question related to ACE inhibitor.   I have asked her to stop her ACE inhibitor.  I have given her  prescription today for atenolol 50, Inderal LA  60, and metoprolol 50 to  see if we can find a drug that it will not further compromise her  sleepiness and fatigue as well as control her hypertension.   As it relates to her VT, this seems to be quite infrequent and following  its symptomatically makes the most sense; hence the beta blockers   Altamese Cabal Christmas   We will see her again in 6 weeks' time to review the above.    Sincerely,      Duke Salvia, MD, [facility]  Electronically Signed    SCK/MedQ  DD: 07/29/2008  DT: 07/30/2008  Job #: [id number]

## 2011-01-08 NOTE — Assessment & Plan Note (Signed)
[facility]                            CARDIOLOGY OFFICE NOTE   NAME:[name], [name]                       MRN:          [id number]  DATE:02/22/2008                            DOB:          04-09-1941    Ms. Patchen is a 70 year old patient referred by Dr. Kriste Basque for  palpitations.   The patient was seen in the hospital by Dr. Tonny Bollman, on December 01, 2007.   She has a very complicated past medical history.  She has had major  depression requiring recent hospitalization at [facility] in April.  The  depression has been lifelong problem.  She has not had any suicidal  ideation, but she has been hospitalized on multiple occasions for it.  She is a longstanding diabetic.  She has had atypical chest pain in the  past when she saw Dr. Excell Seltzer.  Her heart cath was benign.  Filling  pressures were normal and she had no critical coronary artery disease.   She had normal LV function.   She is complaining of palpitations as far as I can tell there have been  no documented arrhythmias.  Serial EKGs I have in the chart shows sinus  rhythm without any evidence of previous AFib, MAT, or PAF.  She is a  previous smoker with a history of COPD followed by Dr. Kriste Basque.  Her  coronary risk factors, otherwise, include hypertension and  hypercholesterolemia.   She had previously been on theophylline, but this was stopped.  She  describes her palpitations is being for the last 10-15 years have been  somewhat worse over the last 3 weeks.  She describes occasional  association pre with lightheadedness and diaphoresis.  She is actually  never passed out.  She is somewhat of difficult historian having a flat  affect.   She does not describe prolonged episodes of rapid heart beat, but flip  flops.   She has not had any outpatient monitoring.   Her review of systems is, otherwise, negative.   Her past medical history is complicated.   As indicated, she has had a history  of major depression being  hospitalized at [facility] from Jan 12, 2008 to Jan 14, 2008.  She has  COPD, autoimmune hepatitis with cirrhosis, question varices seen by Dr.  Juanda Chance, central obesity, GERD, diverticulitis, hypertension, and anemia.   She has had a previous C-section.   MEDICATIONS:  1. Glucophage 1 g b.i.d.  2. Glimepiride 4 mg a day.  3. Januvia 100 mg a day.  4. Lasix 40 b.i.d.  5. Klor-Con 20 mEq a day.  6. Omeprazole 20 a day.  7. Imuran 50 mg a day.  8. ReQuip half tablet nightly.  9. Celexa 40 a day.  10.Vitamin D3.  11.Lisinopril 5 a day.  12.Simvastatin 40 day.   She is allergic to PENICILLIN, THEOPHYLLINE and has ever unclear  reaction to ASPIRIN and is not on it.   The patient lives with her husband.  He works.  She seems to be able to  do activities of daily living.  She has children who  looking on her.  She does not drink.  She is a previous smoker.  Again, her life has been  somewhat clouded by major depression.   Her family history is noncontributory.  Diabetes does run on both sides  of the family.   PHYSICAL EXAMINATION:  GENERAL:  Remarkable for an overweight white  female in no distress.  VITAL SIGNS:  Weight is 225, blood pressure is 138/83, pulse is 72 and  regular.  There are no PACs, AFib, or MAT.  Respiratory rate 14 affect  flat, and afebrile.  HEENT:  Unremarkable.  Carotids are without bruit.  No lymphadenopathy,  thyromegaly, or JVP elevation.  LUNGS:  Clear.  Diaphragmatic motion.  No active wheezing.  S1-S2 with a  systolic ejection murmur.  PMI is normal.  ABDOMEN:  Benign.  Bowel sounds are positive.  No bruit, no tenderness,  and no hepatosplenomegaly, and no hepatojugular reflux.  No tenderness.  EXTREMITIES:  Distal pulses are intact.  No edema.  NEURO:  Nonfocal.  SKIN:  Warm and dry.  No muscular weakness.   IMPRESSION:  1. Palpitations benign.  I doubt there is serious heart problem here.      She just had a heart cath in  April by Dr. Excell Seltzer.  If her symptoms      worse, she will continue consideration for outpatient event monitor      may be in order.  She may be a risk for multifocal atrial      tachycardia given her chronic obstructive pulmonary disease , but I      do not think there is indication for testing right now.  2. Systolic ejection murmur.  Followup echo in 6 months to a year      sounds like aortic sclerosis.  No significant gradient by cath.  3. Diabetes.  Followup with Dr. Kriste Basque, continue Glucophage and      glimepiride.  I told her to check her sugar when she feels somewhat      diaphoretic to make sure she is not having hypoglycemic reaction.  4. Major depression.  Continue ReQuip and Celexa.  5. Autoimmune hepatitis on Imuran.  Follow up with Dr. Juanda Chance.  6. Lower extremity edema, stable.  Continue Lasix, low-salt diet.  The      patient will follow up with Dr. Excell Seltzer who did her initial consult      in April and heart cath.     [name]. Eden Emms, MD, [facility]  Electronically Signed    PCN/MedQ  DD: 02/22/2008  DT: 02/23/2008  Job #: [id number]

## 2011-01-08 NOTE — Assessment & Plan Note (Signed)
[facility]                             PULMONARY OFFICE NOTE   NAME:[name]                       MRN:          [id number]  DATE:07/17/2007                            DOB:          [birth year]    HISTORY OF PRESENT ILLNESS:  The patient is a very pleasant 70 year old  female patient of Dr. Jodelle Green who has a known history of COPD, asthmatic  bronchitis, and hypertension, who presents today for an acute office  visit.  The patient complains of a 2-week history of productive cough  with thick yellow-green sputum, increased shortness of breath with  activity, and severe coughing paroxysms at night.  The patient denies  any hemoptysis, orthopnea, PND, or fever.   PAST MEDICAL HISTORY:  Reviewed.   CURRENT MEDICATIONS:  Reviewed.   PHYSICAL EXAM:  The patient is a pleasant female in no acute distress.  She is afebrile with stable vital signs.  O2 saturation is 99% on 3L.  HEENT:  Unremarkable.  NECK:  Supple without cervical adenopathy.  No JVD.  LUNGS:  Sounds reveal coarse rhonchi bilaterally.  CARDIAC:  Regular rate.  ABDOMEN:  Soft and nontender.  EXTREMITIES:  Warm without any edema.   DATA:  Chest x-ray reveals chronic changes without any acute  infiltrates.   IMPRESSION AND PLAN:  Acute tracheobronchitis with underlying chronic  obstructive pulmonary disease and asthmatic bronchitis.  The patient is  to begin Avelox x7 days.  Mucinex DM twice a day.  The patient is to  continue on present pulmonary regimen and follow back up with Dr. Kriste Basque  in 2 weeks or sooner if needed.      Rubye Oaks, NP  Electronically Signed      Lonzo Cloud. Kriste Basque, MD  Electronically Signed   TP/MedQ  DD: 07/20/2007  DT: 07/20/2007  Job #: [id number]

## 2011-01-08 NOTE — Discharge Summary (Signed)
NAMECHLORIS, MARCOUX NO.:  [id number]   MEDICAL RECORD NO.:  [id number]          PATIENT TYPE:  IPS   LOCATION:  0301                          FACILITY:  [facility]   PHYSICIAN:  Jasmine Pang, M.D. DATE OF BIRTH:  1941-04-23   DATE OF ADMISSION:  01/12/2008  DATE OF DISCHARGE:  01/14/2008                               DISCHARGE SUMMARY   IDENTIFICATION:  This is a 70 year old white female who was married.  She was admitted on a voluntary basis on Jan 12, 2008.   HISTORY OF PRESENT ILLNESS:  This is her first [facility] admission for this 39-  year-old presented in the emergency room complaining that she had been  gradually getting more depressed.  She had just recently been discharged  from the hospital a couple weeks ago.  She was apparently admitted to  [facility] in February for depression and  because she was seen bugs.  She reports, she was discharged on the  28th having had her Prozac discontinued and was started on Celexa 40 mg  daily at bedtime.  She admits to having a lot of trouble keeping up with  her medications and keeping everything straight.  She could not state  reliably that she is taking any of her medications consistently.  She  states she gets very confused by her medication boxes.  She also a lot  of she also endorses a lot of loneliness being home alone all day and  some chronic conflict with her children.  She denies active suicidal  thoughts today, but feels like giving up.  She feels like she has no  reason to go on.  There is no homicidal thoughts.  She is denying any  hallucinations at this time.   PAST PSYCHIATRIC HISTORY:  The patient was recently scheduled to see Dr.  Bartholomew Crews at [facility], but  reports she did not keep her appointment.  This is her first [facility]  admission.  She saw Dr. [name] in [facility] for psychiatry years ago.  She has also seen Abel Presto at the [facility]  outpatient clinic in the past  for counseling.  She reports being discharged from [facility]  October 24, 2007, for depression and seeing bugs.  She reports the  history of chronic depression for 34 years on multiple medications.  She  has no prior history of suicide attempts.  She has no history is  substance abuse.   FAMILY HISTORY:  The patient denies family history of mental illness or  substance abuse.   PAST MEDICAL HISTORY:  1. COPD.  2. Autoimmune hepatitis with resulting cirrhosis of the liver.  3. Morbid obesity.  4. GERD.  5. Diverticulitis.  6. Hypertension.  7. Anemia, NOS.   She is on a number of medications.  See hospital course for these.   ALLERGIES:  1. PENICILLIN.  2. THEOPHYLLINE.  3. She has a reaction to ASPIRIN, but it is unclear what type.   PHYSICAL EXAM:  In the emergency room, the patient was evaluated had a  complete physical  exam.  There were no acute physical or medical  problems noted.   DIAGNOSTIC STUDIES:  Remarkable for urinalysis with a glucose 100 mg  percent.  Urine drug screen is positive for benzodiazepines.  Chemistry  and liver enzymes were within normal limits and hemoglobin was 9.9.   HOSPITAL COURSE:  Upon admission, the patient was started on Ambien 5 mg  p.o. q.h.s. may repeat x1.  She was also restarted on her Advair 250/50  mcg inhaler 1 puff b.i.d. and simvastatin 40 mg daily and aspirin 81 mg  daily.  She was started on O2, not more than 3 liters per minute with a  nasal cannula.  She was started on Imuran 50 mg daily, Celexa 40 mg  daily.  She was taken off the Ambien, she was on before she came into  the hospital.  She was also taken off the ReQuip that she was on before  she came into the hospital.  It was possible that they were causing mood  instability and hallucinations.  She was started on Risperdal 1 mg p.o.  q.h.s.  She was started on glimepiride 4 mg p.o. q.a.m., azathioprine 50  mg daily, Januvia 100 mg  daily, lisinopril 5 mg daily, metformin 1000 mg  p.o. b.i.d.  On Jan 12, 2008, her Celexa was increased to 40 mg daily  and also aspirin was discontinued.  There were no changes made in her  medications other than discontinuing some of the one she had been on  prior to admission like the ReQuip and the Ambien for out of concern  that it was causing mental status changes.  In individual sessions with  me, the patient wants was friendly and cooperative.  She was somewhat  reluctant to participate in unit therapeutic groups and activities,  because she said she was tired.  She also had received oxygen through  nasal cannula constantly.  She discussed living with her husband who  works full time, which causes her to be alone.  She does not like this.  She recently been hospitalized at [facility] for heart problems and COPD.  She  states she began to feel depressed and irritable with some thoughts that  she would be better off dead.  She was experiencing auditory  hallucinations someone calling my name and visual hallucinations  seeing bugs.  As hospitalization progressed, the patient's mental status  improved rapidly.  She became less depressed and less anxious.  Husband  came to visit and this went well.  She felt he was very supportive.  She  requested to go home soon.  On Jan 14, 2008, mental status had improved  markedly from admission status.  The patient's husband was coming for a  family session today along with her daughter.  She wanted to go home  with them after the session.  Sleep was good, appetite was good.  Mood  was less depressed, less anxious.  Affect consistent with mood.  No  suicidal or homicidal ideation.  No auditory or visual hallucinations.  No paranoia or delusions.  Thoughts were logical and goal-directed.  Thought content, no predominant theme.  Cognitive was grossly back to  baseline.  She stated she was in the process of moving into a new home,  which she was excited  about.  I felt this would be a better situation  for her then where she is now.  It was felt she could be safely  discharged today after her family session with her  husband and daughter.   DISCHARGE DIAGNOSES:  Axis I:  Mood disorder, not otherwise specified.  Axis II:  None.  Axis III:  Obesity, chronic obstructive pulmonary disease, hepatic  cirrhosis secondary autoimmune hepatitis gastroesophageal reflux  disease, diverticulosis, hypertension, and anemia, not otherwise  specified.  Axis IV:  Moderate (social isolation and multiple medical problems, also  burden of psychiatric illness).  Axis V:  Global assessment of functioning upon discharge was 50.  GAF  upon admission was 42.  GAF highest past year 76.   DISCHARGE PLAN:  There was no specific activity level or dietary  restrictions.   POSTHOSPITAL CARE PLANS:  The patient will be seen at the [facility] with Saul Fordyce on June 1 at 1 p.m.  She is to ask  for a counselor at this appointment.   DISCHARGE MEDICATIONS:  1. Celexa 40 mg daily.  2. Risperdal 1 mg at bedtime.  3. Januvia 100 mg at 5 p.m.  4. Omeprazole 20 mg daily.  5. Amaryl 4 mg daily.  6. Imuran 50 mg daily.  7. Klor-Con 1 tablet daily.  8. Lisinopril 5 mg daily.  9. Alprazolam 1 mg at bedtime.  10.Lasix 40 mg twice a day.  11.Advair 250/50 mcg 1 puff twice a day.  12.Simvastatin 40 mg daily.  13.Metformin 1000 mg b.i.d.  14.Albuterol 2 puffs p.o. q.4 h. p.r.n. shortness of breath.      Jasmine Pang, M.D.  Electronically Signed     BHS/MEDQ  D:  01/14/2008  T:  01/15/2008  Job:  [id number]

## 2011-01-08 NOTE — H&P (Signed)
NAMEBERYL, HORNBERGER                ACCOUNT NO.:  [id number]   MEDICAL RECORD NO.:  [id number]          PATIENT TYPE:  INP   LOCATION:  4734                         FACILITY:  [facility]   PHYSICIAN:  Oley Balm. Sung Amabile, MD   DATE OF BIRTH:  Dec 01, 1940   DATE OF ADMISSION:  11/27/2007  DATE OF DISCHARGE:                              HISTORY & PHYSICAL   PROBLEM LIST:  Substernal chest pain.   HISTORY OF PRESENT ILLNESS:  Ms. Cohea is a 70 year old woman followed  as an outpatient by Dr. Kriste Basque, with multiple medical problems as  documented below.  She presented to the emergency department on the day  of admission, at approximately 6:30 p.m.  She reports that she developed  retrosternal chest pain, while sitting on the commode at approximately  5:00 p.m.  She describes this chest pain as like a heavy knot.  The  pain radiated to her mandible.  It was associated with diaphoresis.  She  called EMS and received one sublingual nitroglycerin in the ambulance en  route to the [facility] emergency department.  By the  time that she arrived to the emergency department, her chest pain had  resolved.  In retrospect, she has chest pain approximately every other  day or so, usually associated with activities of daily living such as  house cleaning.  These episodes typically resolve with rest.  The  difference today from the prior episodes was the radiation to the  mandible.  She reports to me that she has had a recent echocardiogram  and carotid Dopplers performed by Dr. Kriste Basque.  She denies any change in  her baseline exercise tolerance.  She has no orthopnea, paroxysmal  nocturnal dyspnea or lower extremity edema.   PAST MEDICAL HISTORY:  As documented in the last office note by Dr.  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   CURRENT MEDICATIONS:  These have been reviewed in detail and are  documented in the office note from November 09, 2007, which is included in  her current medical record.   SOCIAL HISTORY:  She quit smoking 15 years ago.  There is no history of  excessive alcohol or illicit drug use.  She is a disabled and had no  history of significant occupational or abdominal exposures.   FAMILY HISTORY:  This has been previously reviewed and is  noncontributory.   REVIEW OF SYSTEMS:  Notable for frequent episodes of epistaxis, frequent  sweats, anxiety, and a multitude of aches and pains.   PHYSICAL EXAMINATION:  GENERAL:  In general, she is well-developed, well-  nourished and appears older than her stated age.  She is moderately to  severely obese, in no acute cardiac or respiratory disease.  VITAL SIGNS:  She is afebrile with normal vital signs.  HEENT:  Reveals no acute abnormalities.  NECK:  Examination reveals no jugular venous distention.  Thyroid gland  is normal.  There is no palpable adenopathy.  CHEST:  Examination reveals a normal percussion note throughout.  Breath  sounds are full without wheezes or other adventitious sounds.  CARDIAC:  Exam reveals a regular rate and rhythm with a 2/6 systolic  murmur, heard best along the left sternal border and non-radiating.  ABDOMEN:  Obese, soft, nontender, with normal bowel sounds.  EXTREMITIES:  Without clubbing, cyanosis or edema.  Distal pulses are  2+.  Capillary refill is normal.  NEUROLOGIC:  Exam reveals no focal deficits.   DATA:  EKG reveals normal sinus rhythm with mild non-specific ST and T-  wave changes, not significantly changed from prior EKG in 2007.  Chest x-  ray reveals no  acute cardiac or pulmonary disease.  First cardiac panel  is entirely normal.  Chemistries are notable only for a potassium of 3.4  and otherwise unremarkable.   IMPRESSION:  1. Multiple medical problems as listed above.  2. Retrosternal chest pain - She has at least two major risk factors      for coronary artery disease.  Her description of the chest pain is      highly suggestive of angina.  It is now resolved completely, and      EKG is normal with normal cardiac panel.   PLAN:  She will be admitted to telemetry, to rule out myocardial  infarction.  I have contacted the cardiology fellow on call, and she  should be seen at some point during this hospitalization, but I suspect  that she can undergo an ischemia workup as an outpatient.  I have not  started her on full-dose heparin, due to her prior history of blood loss  anemia and due to the medical problems listed above.  However, if she  has further chest pain, full dose heparin will be indicated.  She will  be treated with aspirin and sublingual nitroglycerin as needed.  The  rest of her medical regimen will be continued as documented.      Oley Balm Sung Amabile, MD  Electronically Signed     DBS/MEDQ  D:  11/28/2007  T:  11/28/2007  Job:  [id number]   cc:   Lonzo Cloud. Kriste Basque, MD

## 2011-01-08 NOTE — Cardiovascular Report (Signed)
NAMERAINIE, [name]                ACCOUNT NO.:  [id number]   MEDICAL RECORD NO.:  [id number]          PATIENT TYPE:  INP   LOCATION:  4734                         FACILITY:  [facility]   PHYSICIAN:  Veverly Fells. Excell Seltzer, MD  DATE OF BIRTH:  [birth year]   DATE OF PROCEDURE:  12/01/2007  DATE OF DISCHARGE:                            CARDIAC CATHETERIZATION   PROCEDURES:  1. Left heart catheterization.  2. Selective coronary angiography.  3. Left ventricular angiography.  4. Star Close of the right femoral artery.   INDICATIONS:  Ms. Scarfo is a 70 year old woman with multiple cardiac  risk factors including type 2 diabetes, obesity, hypertension, who  presented with chest pain.  In the setting of her multiple risk factors,  she was referred for cardiac catheterization.   Risks and indications of the procedure were reviewed with the patient.  Informed consent was obtained.  The right groin was prepped, draped,  anesthetized with 1% lidocaine.  Using the modified Seldinger technique,  a 6-French sheath was placed in the right femoral artery.  Standard 6-  French Judkins catheters were used for coronary angiography and left  ventriculography.  A pullback across the aortic valve was performed.  At  the completion of the procedure a Star Close device was used to close  the femoral arteriotomy.  The patient tolerated the procedure well and  had no immediate complications.   FINDINGS:  Aortic pressure 129/64 with a mean of 89, left ventricular  pressure 131/27.   CORONARY ANGIOGRAPHY:  Left mainstem:  The left mainstem is widely  patent.  It bifurcates into the LAD and left circumflex.  There is no  significant stenosis present.   LAD:  The the LAD is a large-caliber vessel that courses down and wraps  around the left ventricular apex.  There is a large first diagonal  branch that is widely patent.  There is also a large first perforating  branch.  Just beyond the first diagonal there is  diffuse nonobstructive  stenosis of no more than 20% in severity.  The remaining portions of the  mid and distal LAD have no significant angiographic stenosis throughout.   Left circumflex:  The left circumflex is widely patent.  It courses down  and supplies small first and second OM branches and a large third OM  branch that bifurcates into twin vessels.  There is no significant  stenosis throughout the left circumflex.   Right coronary artery:  The right coronary artery is widely patent.  In  the proximal and midvessel there is diffuse nonobstructive stenosis in  the range of 20-30%.  There is a small first RV marginal branch and a  moderate-sized second RV marginal.  Distally the right coronary artery  supplies twin PDAs and a posterolateral branch.  There is no significant  stenosis in any portion of the right coronary artery other than very  mild nonobstructive plaque in a diffuse nature.   Left ventriculography shows low normal LV systolic function with an LVEF  estimated in the range of 50-55%.  There is no significant mitral  regurgitation.  ASSESSMENT:  1. Nonobstructive coronary artery disease.  2. Low normal left ventricular systolic function.  3. Elevated left ventricular end-diastolic pressure.   PLAN:  Recommend ongoing medical therapy for the patient's  nonobstructive CAD.  Her increased LV filling pressures may be secondary  to hypertensive heart disease.      Veverly Fells. Excell Seltzer, MD  Electronically Signed     MDC/MEDQ  D:  12/01/2007  T:  12/01/2007  Job:  [id number]

## 2011-01-08 NOTE — Discharge Summary (Signed)
[name], MAGNUSSEN                ACCOUNT NO.:  [id number]   MEDICAL RECORD NO.:  [id number]          PATIENT TYPE:  INP   LOCATION:  4734                         FACILITY:  [facility]   PHYSICIAN:  Lonzo Cloud. Kriste Basque, MD     DATE OF BIRTH:  05/07/1941   DATE OF ADMISSION:  11/27/2007  DATE OF DISCHARGE:  12/02/2007                               DISCHARGE SUMMARY   FINAL DIAGNOSES:  1. Admitted on November 27, 2007, by Dr. [name] with chest pain.      Evaluated by [facility]iology with cardiac catheterization      revealing nonobstructive coronary disease and pain felt to be chest      wall related.  2. Chronic obstructive pulmonary disease - controlled on medications.  3. Hypertension - medications adjusted at this admission.  4. History of aortic stenosis.  5. Diabetes mellitus - the patient controlled on oral meds.  6. Morbid obesity.  7. Gastroesophageal reflux disease.  8. Diverticulosis.  9. History of intestinal angiodysplasia with chronic gastrointestinal      blood loss followed Dr. [name].  10.History of colon polyps.  11.History of autoimmune hepatitis, on Imuran and followed by Dr.      Juanda Chance.  12.Degenerative arthritis.  13.Fibromyalgia.  14.Osteoporosis.  15.History of anxiety and depression.  16.History of iron deficiency anemia from chronic gastrointestinal      blood loss as above.   BRIEF HISTORY AND PHYSICAL:  The patient is a 70 year old white female  who presented to the emergency room on November 27, 2007, with substernal  chest pain.  She had been sitting on the commode when noticed heavy  knot in her chest.  The pain radiated to her jaw.  She had some  associated diaphoresis.  EMS was called, and she was given nitroglycerin  in the field.  She brought to the emergency room, where she was  evaluated by the ER staff.  EKG showed sinus bradycardia and nonspecific  ST-T wave changes.  There were no acute abnormalities.  She will rule  out for myocardial  infarction, was admitted by Dr. [name] in my absence  with consult to [facility].   PAST MEDICAL HISTORY:  Please see the office EMR for details.  She has a  history of COPD and is on numerous medications.  She has frequent  bronchitic exacerbations and requires frequent antibiotics.  She has a  history of hypertension and mild aortic stenosis.  She has a history of  diabetes and is controlled on oral agents.  She is morbidly obese.  She  has a history of diverticulosis, gastroesophageal reflux disease, and  colon polyps.  She has chronic GI blood loss from angiodysplasia with a  portal hypertensive gastropathy and GAVE syndrome  followed Dr. [name].  She has autoimmune hepatitis and takes Imuran under her  direction as well.  She also has a history of osteoarthritis,  fibromyalgia, and osteoporosis.  She has history of anxiety and  depression and recent psychotic episode requiring hospitalization at  [facility] with delusions of pedunculosis.  She is on numerous psychotropic  drugs from her psychiatrists.   PHYSICAL EXAMINATION:  GENERAL: Examination at the admission revealed an  obese 66-year white female in no acute distress.  Blood pressure 146/66,  pulse 72 per minute and regular, respirations 18 per minute and not  labored, and temperature 98 degrees.  HEENT: Unremarkable except for dry mucous membranes.  NECK: Showed no jugular distention.  No carotid bruits.  CHEST: Clear to percussion and auscultation.  CARDIAC: Revealed regular rhythm, grade 1-2/6 systolic ejection murmurs  at sternal boarder.  No rubs or gallops heard.  ABDOMEN: Obese, soft, and nontender without evidence of organomegaly or  masses.  EXTREMITIES: Showed no cyanosis or clubbing.  NEUROLOGIC: Intact without focal abnormalities.   LABORATORY DATA:  EKG showed sinus bradycardia and nonspecific ST-T wave  changes.  There were no acute abnormalities.  Chest x-ray showed some  cardiomegaly and  scattered basilar atelectatic changes.  CBC showed  hemoglobin 8.4, hematocrit 25.0, white count 4800, and 122,000  platelets.  Sodium 136, potassium 3.6, chloride 104, CO2 of 24, BUN 11,  creatinine 0.69, blood sugar 182, and calcium 8.8.  CPK 53, negative MB  and negative troponin, bilirubin 0.8, alk phos 88, SGOT 24, SGPT 17,  total protein 5.9, and albumin 3.1.  Serum iron 154 and ferritin 295.  Protime 14.2, INR 1.1, and PTT 30 seconds.  Cholesterol 137,  triglycerides 83, HDL 32, LDL 88, and followup hemoglobin 9.3.   HOSPITAL COURSE:  The patient was admitted with chest pain for  evaluation.  She was seen by [facility] Cardiology Service.  They felt that  heart catheterization was indicated.  This was performed on December 01, 2007, by Dr. Excell Seltzer.  She proved to have nonobstructive coronary disease  with 20% diffuse LAD lesion and a similar 20 to 30% nonobstructive  lesion in the right coronary artery.  The LV ejection fraction estimated  50 to 55% and no significant mitral regurgitation.  It was felt that her  chest pain on admission was noncardiac in origin and had resolved with  pain medicine in the hospital.   As noted, she was anemic and has a long history of GI blood loss anemia  from watermelon stomach/GAVE syndrome.  Her iron level was okay,  however.  This will be followed as an outpatient.   MEDICATIONS AT DISCHARGE:  The patient had several medicines added to  her long medicine list during this hospitalization. These include:  1. Lisinopril 5 mg p.o. daily.  2. Simvastatin 40 mg p.o. daily.  3. Vitamin D 50,000 units capsules 1 week.   She was instructed to continue her home medications as before which  include:  1. Enteric-coated aspirin 81 mg p.o. daily.  2. Advair 250 1 puff b.i.d.  3. Albuterol 2 sprays every 4 hours as needed for wheezing.  4. Mucinex 1 tablet p.o. b.i.d. with plenty of fluids.  5. Glucophage 500 mg tablets, 2 tablets p.o. b.i.d.  6. Glimepiride 4  mg p.o. every morning.  7. Januvia 100 grams p.o. daily with dinner.  8. Lasix 40 mg p.o. b.i.d.  9. K-20 one p.o. daily.  10.Omeprazole 20 mg p.o. daily.  11.Promethazine 25 mg p.o. q.6 h. as needed for nausea.  12.Imuran 50 mg p.o. daily.  13.Requip 1 mg p.o. nightly.  14.Risperdal 2 mg p.o. as directed by psychiatrist.  15.Wellbutrin XL 300 mg as directed by psychiatrist.  16.Celexa 40 mg p.o. as directed by psychiatrist.  17.Xanax 1 mg p.o. as directed by psychiatrist.  18.Ambien 10 mg as directed by psychiatrist.   CONDITION ON DISCHARGE:  Improved.   The patient will follow up in the office in 2 weeks.      Lonzo Cloud. Kriste Basque, MD  Electronically Signed     SMN/MEDQ  D:  12/02/2007  T:  12/02/2007  Job:  [id number]

## 2011-01-08 NOTE — Discharge Summary (Signed)
NAMEKYLEI, [name]                ACCOUNT NO.:  [id number]   MEDICAL RECORD NO.:  [id number]          PATIENT TYPE:  IPS   LOCATION:  0407                          FACILITY:  [facility]   PHYSICIAN:  Anselm Jungling, MD  DATE OF BIRTH:  1940/12/25   DATE OF ADMISSION:  06/01/2008  DATE OF DISCHARGE:  06/05/2008                               DISCHARGE SUMMARY   IDENTIFYING DATA/REASON FOR ADMISSION:  The patient is a 70 year old  married Caucasian female who was admitted because I felt like I needed  to get out of this world, so everybody would have to worry about me.  She was admitted with a history of depression, and exacerbation of  suicidal thinking.  She had been seeing Saul Fordyce for medication  management.  The patient reported that in her opinion, her mood had  deteriorated in relation to a recent discontinuation of amitriptyline,  which was necessary due to emergence of cardiac arrhythmia and syncope.  Please refer to the admission note for further details pertaining to the  symptoms, circumstances and history that led to her hospitalization.  She was given an initial Axis I diagnosis of major depressive disorder,  recurrent.   MEDICAL AND LABORATORY:  The patient came to [place] with multiple medical  problems, including COPD, macular degeneration, diabetes mellitus,  obesity, arrhythmia and self-reported liver disease.  She was medically  and physically assessed by the psychiatric nurse practitioner.  She was  continued on her usual medications as well as supplemental oxygen.  There were no acute medical issues during her inpatient stay.   HOSPITAL COURSE:  The patient was admitted to the adult inpatient  psychiatric service.  She presented as a moderately obese, but normally-  developed adult female who was alert, fully oriented, and pleasant, but  appeared very sad and tired.  There were no signs or symptoms of  psychosis.  She denied any active suicidal ideation and verbalized  a  strong desire for help.  She discussed various frustrations with her  home situation.   She needed a lot of encouragement to get out of bed and participate in  therapeutic groups and activities.  She had difficulty adjusting to the  unit because of night time activity noise, and because some of the other  patients in the inpatient milieu made her uncomfortable due to their  behavioral symptoms.  Nonetheless, she did her best to benefit from the  hospital stay as much as possible.   On the fourth hospital day, she indicated that she felt ready to go  home.  She was not experiencing any suicidal ideation, and felt ready to  return to outpatient care as follows.   AFTERCARE:  The patient was to follow up at [facility], and was to see Saul Fordyce on June 07, 2008.   DISCHARGE MEDICATIONS:  1. Zocor 40 mg nightly.  2. Xanax 0.5 mg q.a.m. and 1 mg nightly.  3. Risperdal 1.5 mg nightly.  4. Advair 1 puff twice daily.  5. Guaifenesin 1 tablet twice daily.  6. Prinivil 10 mg daily.  7. Lasix 40 mg b.i.d.  8. Glucophage 1000 mg b.i.d.  9. Amaryl 4 mg q.a.m.  10.Potassium 20 mEq daily.  11.Protonix 40 mg daily.  12.Imuran 50 mg daily.  13.Celexa 40 mg daily.  14.Januvia 100 mg nightly.  15.Nystatin cream twice daily to affected areas.   DISCHARGE DIAGNOSES:  Axis I:  Major depressive disorder, recurrent  without psychotic features.  Axis II:  Deferred.  Axis III:  History of hypertension, hyperlipidemia, diabetes mellitus,  obesity, chronic obstructive pulmonary disease, gastroesophageal reflux  disease.  Axis IV:  Stressors severe.  Axis V:  GAF on discharge 55.      Anselm Jungling, MD  Electronically Signed     SPB/MEDQ  D:  06/07/2008  T:  06/07/2008  Job:  [id number]

## 2011-01-08 NOTE — Assessment & Plan Note (Signed)
[facility]                         GASTROENTEROLOGY OFFICE NOTE   NAME:Burgen, [name]                       MRN:          [id number]  DATE:01/05/2007                            DOB:          1940/12/30    Ms. 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  She comes for followup of anemia and heme-positive  stool.  Last colonoscopy was in 2000; she is due for another one.  Between the last appointment 3 months ago and this time, her condition  has not changed.  Her hemoglobin today is 11.9, hematocrit 35.2 and her  platelet count is 131,000 on Imuran 50 mg daily.   PHYSICAL EXAMINATION:  Blood pressure 112/72, pulse 60 and weight is  228.8 pounds, which is stable.  She was alert and oriented.  There was no asterixis.  LUNGS:  Clear to auscultation.  COR:  Rapid S1 and S2.  ABDOMEN:  Obese, soft, diffusely tender, more so around the right costal  margin.  Her liver was enlarged to about 5-6 cm below right costal  margin.  The costal margin itself was very tender bilaterally.  She had  a large panniculus of the lower abdomen.  There was no hernia.  Bowel  sounds were normoactive.  RECTAL:  Exam revealed soft, Hemoccult-positive stool.   IMPRESSION:  1. [age]-year-old white female with stable chronic      gastrointestinal blood loss due to portal hypertension and      telangiectasia of the stomach.  She is due for colonoscopy.  2. Autoimmune liver disease, on Imuran 50 mg daily.   PLAN:  1. Continue the same medications.  2. Next time, check CBC and iron studies as well to determine if she      needs iron infusion.  She also will need colonoscopy this summer,      2008.     Hedwig Morton. Juanda Chance, MD     DMB/MedQ  DD: 01/05/2007  DT: 01/06/2007  Job #:  [id number]   cc:   Lonzo Cloud. Kriste Basque, MD

## 2011-01-08 NOTE — Assessment & Plan Note (Signed)
[facility]                         GASTROENTEROLOGY OFFICE NOTE   NAME:Harms, JAQUETTA CURRIER                       MRN:          [id number]  DATE:02/26/2007                            DOB:          [birth year]    Ms. Angelini is a 70 year old white female with history of chronic GI blood  loss due to portal hypertensive gastropathy and watermelon stomach. She  has chronic iron deficiency anemia requiring periodic iron infusions.  She also is a diabetic. She is morbidly obese and she has autoimmune  liver disease which is under good control with Imuran 50 mg a day. Her  ANA titer was as high __________ 640. Her last upper endoscopy was in  February 2006, last colonoscopy in August 2000. The patient is due for  recall colonoscopy this summer.   Her complaints today are scratchiness in the back of her throat and a  yeast infection under right breast. Another complaint is some swelling  in her feet and lack of energy.   PHYSICAL EXAMINATION:  Blood pressure 128/72, pulse 78 and weight is 227  pounds. The patient was alert and oriented, markedly obese.  LUNGS: Clear to auscultation.  COR: Normal S1, S2.  Examination of the skin showed erythematous, pruritic dermatitis under  the right breast consistent with a yeast infection.  ABDOMEN: Was protuberant, soft, large liver several centimeters below  right costal margin. No ascites.  RECTAL: Exam today shows Hemoccult  negative stool.  EXTREMITIES: Trace edema.   IMPRESSION:  The patient is stable from a liver standpoint as well as  from the standpoint of GI bleeding.   PLAN:  1. She had blood drawn by Dr.  Kriste Basque this morning, which among other      things included CBC and iron studies.  2. Refill for Imuran 50 mg daily.  3. Colonoscopy scheduled for later this summer.  4. Mycolog cream 15 grams dispensed and use three times a day under      right breast in combination with Diflucan 100 mg daily for seven  days.     Hedwig Morton. Juanda Chance, MD  Electronically Signed    DMB/MedQ  DD: 02/26/2007  DT: 02/26/2007  Job #: [id number]   cc:   Lonzo Cloud. Kriste Basque, MD

## 2011-01-08 NOTE — Consult Note (Signed)
NAMEROSLIND, MICHAUX NO.:  [id number]   MEDICAL RECORD NO.:  [id number]          PATIENT TYPE:  INP   LOCATION:  4734                         FACILITY:  [facility]   PHYSICIAN:  Duke Salvia, MD, FACCDATE OF BIRTH:  06-May-1941   DATE OF CONSULTATION:  11/28/2007  DATE OF DISCHARGE:                                 CONSULTATION   SUMMARY OF HISTORY:  Ms. Hepp is a 70 year old white female who was  admitted through [facility] emergency room secondary to chest  discomfort.  She has different types of chest discomfort.  She  occasionally has a funny feeling discomfort underneath her left breast  on occasion.  However, what brought her to the emergency room is a  grabbing heaviness in her xyphoid area.  She states that this has been  going on for at least 2 years.  May occurred 2-3 times a week and lasts  anywhere from 5 minutes up to an hour.  Occasionally, associated with  radiation to the jaw, shortness of breath and diaphoresis.  In most  cases, she states that it occurs in the afternoon when she is very  tired.  She tries to relax and lie down.  However, on the day of  admission, she had similar symptoms while sitting on the toilet post  urination, and she stated that she also felt funny.  She denied any  nausea, vomiting or syncope.  She caught her husband's attention who  administered two baby aspirin.  Her husband stated she appeared pale.  EMS was summoned.  However, the report is not available.  She has not  had any further of the grabbing heavy discomfort since admission,  however she continues to have the twinges on her left side.   PAST MEDICAL HISTORY:   ALLERGIES:  PENICILLIN AND THEOPHYLLINE, GI INTOLERANCE TO HIGH DOSE  ASPIRIN.   MEDICATIONS PRIOR TO ADMISSION:  They were extensive, according to  primary care's March 16 office note, these include:  1. Aspirin 81 mg daily.  2. Advair 250/50 b.i.d.  3. Albuterol 90 p.r.n.  4. Mucinex DM 30/600 mg  b.i.d.  5. Glucophage 500 mg two b.i.d.  6. Glimepiride 4 mg daily.  7. Januvia 100 mg daily.  8. Lasix 40 mg b.i.d.  9. K-Dur 20 mEq daily.  10.Omeprazole 20 mg daily.  11.Promethazine 25 mg q.6 h p.r.n.  12.Imuran 50 mg daily.  13.Requip q.h.s.  14.Risperdal 2 mg per psychiatrist.  15.Wellbutrin XR 300 mg per psychiatrist.  16.Celexa 40 mg per psychiatrist.  17.Xanax 1 mg per psychiatrist.  18.Ambien 10 mg per psychiatrist.   PAST MEDICAL HISTORY:  1. COPD.  2. Asthma.  3. Hypertension.  4. Type 2 diabetes.  5. Obesity.  6. Extensive GI issues which include GERD, diverticulosis, colon      polyps, autoimmune hepatitis with multiple esophageal dilatations,      portal hypertension, gastropathy, watermelon stomach.  7. Osteoarthritis.  8. Osteoporosis.  9. Fibromyalgia.  10.Major depression/psychotic behavior in the past.  11.Iron deficiency anemia with a recent hospitalization at [facility]  12.Dr. Kriste Basque has ordered  echocardiogram and carotid Dopplers in      January.  Echo showed EF of 60%.  No wall motion abnormalities,      mild aortic stenosis with a mean gradient of 11.  Carotid Dopplers      report is not available.   SURGICAL HISTORY:  1. Three breast biopsies.  2. Three C-sections.  3. Appendectomy.  4. Hysterectomy x2.  5. Hemorrhoidectomy.  6. Bladder tack.   SOCIAL HISTORY:  She resides in [place] with her second husband.  She  has four biological children, nine grandchildren, eight great  grandchildren.  She is on disability.  She has not smoked in over 15  years, prior to that, she smoked one to two packs for 30 years.  Denies  any alcohol, drugs, specific diet or exercise.   FAMILY HISTORY:  Mother died at the age of 12 secondary to diabetes  complications.  She was also on hemodialysis with a history of MI and  lower extremity amputations x2.  Father also died at the age of 30 with  diabetes complications.  She has one brother has hypertension.   She has  three sisters, the youngest which has MS, another has insulin dependent  diabetes and osteoporosis.   REVIEW OF SYSTEMS:  In addition to the above is notable for a  temperature of 100 last week, chills, 15 pounds weight loss in the last  6 weeks, headaches, epistaxis, glasses, dentures, easy bruisability,  chronic dyspnea on exertion, edema in the first part of the month, since  has resolved, chronic dry cough, occasional wheezing, positive snoring  but never been evaluated for sleep apnea, postmenopausal anxiety, back  arthralgias, intermittent diarrhea and constipation, dark stools  (however, she has taken a lot of iron), GERD, abdominal discomfort,  food/water/pill dysphagia.   PHYSICAL EXAMINATION:  GENERAL:  Well-nourished well-developed, pleasant  obese white female in no apparent distress.  Husband is present.  Telemetry showed normal sinus rhythm.  VITAL SIGNS:  Temperature 97.90, blood pressure 125/60, pulse 57,  respirations 20, 98% sat on 2 liters.  HEENT:  Unremarkable except for upper edentulous.  NECK:  Supple without thyromegaly, adenopathy or JVD.  She does have  bilateral carotid bruits which are probably radiated murmur.  LUNGS:  Symmetrical excursion.  Clear to auscultation.  However, she  does have coughing with deep inspiration.  HEART:  PMI is not displaced.  Regular rate and rhythm.  Normal S1-S2.  She has a 1/6 systolic ejection murmur best appreciated at the upper  left sternal border.  All pulses are symmetrical intact without the  appreciable abdominal bruits.  Skin is grossly intact.  EXTREMITIES:  Negative cyanosis, clubbing or edema.  ABDOMEN:  Obese.  Bowel sounds present without organomegaly, masses or  tenderness.  EXTREMITIES:  Negative cyanosis, clubbing or edema.  MUSCULOSKELETAL/NEUROLOGICAL:  Grossly unremarkable.   STUDIES:  Chest x-ray showed no active disease and EKG showed normal  sinus rhythm, left axis deviation, delayed R  first-degree AV block, left  anterior hemiblock.  I-stat on admission showed H&H 9.9 and 29.  Sodium  141, potassium 3.4, BUN 14, creatinine 1.0, glucose 68.  CK MBs and  relative index and troponins have been within normal limits x2.  Repeat  blood work today showed H&H of 8.4 and 25.0, normal indices, WBC is 4.8,  platelets 122.  Sodium 136, potassium 3.6, BUN 11, creatinine 0.69,  glucose 182, normal LFTs.   IMPRESSION:  1. Chest discomfort with two distinct syndromes.  Her for site is      concerning for angina.  2. Multiple cardiac risk factors which include obesity, hypertension,      diabetes.  Probable hyperlipidemia and family history.  3. Obesity.  4. Complex GI history.   DISPOSITION:  Dr. Graciela Husbands reviewed the patient's history, spoke with and  examined the patient.  Pretest probability with cardiac risk factors  have an increased false negative rate for Myoview scanning.  Given her  continued symptoms, Dr. Graciela Husbands has recommended cardiac catheterization.  He has reviewed the risks, benefits and procedure with the patient and  her husband, and they have agreed to proceed with cardiac  catheterization on Monday for definitive diagnosis.  Will continue cycle  enzymes and check lipids in the morning.      Joellyn Rued, PA-C      Duke Salvia, MD, [facility]  Electronically Signed    EW/MEDQ  D:  11/28/2007  T:  11/28/2007  Job:  [id number]   cc:   Lonzo Cloud. Kriste Basque, MD  Hedwig Morton. Juanda Chance, MD  Dr. Christella Hartigan, ENT

## 2011-01-08 NOTE — Assessment & Plan Note (Signed)
[facility]                            CARDIOLOGY OFFICE NOTE   NAME:[name], [name]                       MRN:          [id number]  DATE:05/23/2008                            DOB:          1941/01/11    [name] was added on as a DOD patient.  She is a patient of Dr. Graciela Husbands  and Dr. Excell Seltzer.  She was hospitalized in April 2009 for atypical chest  pain.  She was initially seen in consultation by Dr. Graciela Husbands.  Her heart  cath was done by Dr. Excell Seltzer.  She had no significant coronary artery  disease.  She had mildly elevated LVEDP.   She has normal LV function.   She apparently was in the emergency room.  On April 13, 2008, she was  seen by Dr. Earlyne Iba and Dr. Thomasene Lot.  She was seen for  dizzy spells.  She had associated palpitations.  She had subsequent  blurry vision.  Her sugars have not been well controlled.   Apparently, she saw Dr. Kriste Basque on Friday and was started on Lantus.   I do not have that note in front of me.   The patient's palpitation sound benign.  She has never had a documented  history of VT, PAF, or PSVT.  However, she reports having flip-flops and  occasional skips everyday over the last few weeks.   We discussed this and thought it would be easy to get an event monitor  for her.   The patient does have a history of a murmur.  She has had it since her  30s.  Her 2-D echocardiogram, I believe, showed mild aortic stenosis.  This was most recently done on September 21, 2007.  Her mean gradient was  only 11 mmHg with a peak gradient of 27 mmHg.   Otherwise, she is not having any significant chest pain.  She has not  had frank syncope.   PAST MEDICAL HISTORY:  The patient's past medical history is very  complicated.  She has brittle diabetes with poor control, history of  COPD and bronchitis, history of major depression with ongoing  psychiatric care with hospitalization at [facility] in May, history of  diverticulosis,  history of autoimmune hepatitis, history of  osteoarthritis, fibromyalgia, restless leg syndrome, and iron-deficiency  anemia.  She is allergic to PENICILLIN, THEOPHYLLINE and ASPIRIN.  Past  medical history is as discussed.  She has no significant documented  cardiac problems.   PAST SURGICAL HISTORY:  Hysterectomy, hemorrhoidectomy, bilateral breast  biopsies, and previous C-section.   FAMILY HISTORY:  Noncontributory.   The patient is a previous smoker.  She does not use drugs.  She has  multiple children who help care for her.  She is sedentary and has  significant depression.   MEDICATION LIST:  1. Mucinex.  2. Albuterol.  3. Lisinopril 5 mg a day.  4. Lasix 40 a day.  5. Simvastatin 40 a day.  6. Glimepiride 4 mg a day.  7. Glucophage 500 b.i.d.  8. Januvia 100 mg a day.  9. Klor-Con 20 a day.  10.Omeprazole 20 a day.  11.Promethazine.  12.Imuran 50 mg a day.  13.Risperdal 1 mg 1-1/2 tablets a day.  14.Celexa 40 mg.  15.Xanax p.r.n.  16.Lantus insulin 5 units a day.   PHYSICAL EXAMINATION:  GENERAL:  Remarkable for a morbidly obese female  in no distress.  VITAL SIGNS:  Her blood pressure is 120/68; she is not postural; pulse  is 70 and regular; respiratory rate 14; afebrile; and weight is 336  pounds.  HEENT:  Unremarkable.  NECK:  Carotids are normal without bruit.  No lymphadenopathy,  thyromegaly, or JVP elevation.  LUNGS:  Clear.  Good diaphragmatic motion.  No wheezing.  S1 and S2 with  a mild AS murmur.  PMI not palpable.  ABDOMEN:  Protuberant.  Bowel sounds positive.  No AAA.  No tenderness.  No bruit.  No hepatosplenomegaly or hepatojugular reflux.  EXTREMITIES:  Distal pulses are intact.  No edema.  NEURO:  Nonfocal.  SKIN:  Warm and dry.  MUSCULOSKELETAL:  No muscular weakness.   Her EKG shows sinus rhythm and nonspecific ST-T wave changes.   IMPRESSION:  1. Ongoing palpitations.  No documented structural heart disease.  A      30-day event  monitor.  Followup with Dr. Graciela Husbands.  2. Previous atypical chest pain.  Normal catheterization by Dr.      Excell Seltzer.  No indication for further workup.  3. Lower extremity edema with history of elevated end-diastolic      pressure.  Continue current dose of Lasix with potassium      supplementation.  4. Major depression.  Followup Psychiatry and Psychology.  Continue      current dose of Risperdal and Celexa, p.r.n. Xanax.  5. Diabetes, brittle.  Followup with Dr. Kriste Basque.   The patient does not appear to have any serious cardiac issues.  Again,  her primary doctors in the cardiology division, should she need  followup, will be Dr. Graciela Husbands and Dr. Excell Seltzer.      [name]. Eden Emms, MD, [facility]  Electronically Signed    PCN/MedQ  DD: 05/23/2008  DT: 05/24/2008  Job #: [id number]

## 2011-01-11 NOTE — Procedures (Signed)
[facility]  Patient:    [name], [name]                       MRN: [id number] Proc. Date: 01/16/00 Adm. Date:  [id number] Disc. Date: [id number] Attending:  Mervin Hack CC:         Barbette Hair. Arlyce Dice, M.D. [facility]                           Procedure Report  DATE OF BIRTH:  10-Jul-1941  PROCEDURE:  Upper endoscopy and argon laser coagulation of vascular ectasia.  HISTORY OF PRESENT ILLNESS:  This 70 year old white female was recently hospitalized with profound anemia and Hemoccult positive stools. 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 She was endoscoped by Dr. Melvia Heaps with argon plasma coagulator oblation of her vascular ectasia. She has been on proton pump inhibitors and has improved symptomatically. She also has been on iron supplements. She is now undergoing repeat endoscopy with assessment of the mucosal abnormalities and possible plans for [facility] application.  ENDOSCOPE:  Olympus single channel videoscope.  SEDATION:  Versed 7 mg IV, Demerol 75 mg IV.  FINDINGS:  The Olympus single channel videoscope passed under direct vision through the posterior pharynx into the esophagus. The patient was monitored by pulse oximeter. Her oxygen saturations were normal. Proximal and distal esophageal mucosa was unremarkable. There was no hiatal hernia nor esophagitis.  STOMACH:  The stomach was insufflated with air. At this time, the gastric folds appeared entirely normal. The edema was gone. The erythema that was described by Dr. Arlyce Dice was resolved within the body of the stomach. Biopsies were taken from gastric rugal folds to rule out any residual inflammatory changes. In the gastric antrum, however, there were patches of vascular ectasia within a small area about 4 or 5 areas as well as in the orifice of the pyloric outlet itself. Argon laser was used to oblate patches of the vascular ectasia  in a systematic fashion. All the areas were oblated and there were no residual vascular lesions. The patient tolerated the procedure well.  IMPRESSION:  Marked resolution of the watermelon stomach with residual gastropathy in the gastric antrum status post argon plasma coagulator oblation of vascular ectasia status post biopsies of the gastric bodies.  PLAN: 1. Continue proton pump inhibitors. 2. Add Levsin sublingually 0.125 mg p.r.n. epigastric pain. 3. CBC with diff today. 4. Continue iron supplements. 5. Follow-up in the office. DD:  01/16/00 TD:  01/20/00 Job: [id number]

## 2011-01-11 NOTE — Discharge Summary (Signed)
[facility]  Patient:    AMRIT, ERCK Visit Number: [id number] MRN: [id number]          Service Type: MED Location: [phone] Attending Physician:  Verdene Lennert Dictated by:   Lonzo Cloud. Kriste Basque, M.D. [facility] Admit Date:  10/27/2001 Discharge Date: 11/01/2001   CC:         Hedwig Morton. Juanda Chance, M.D. [facility]   Discharge Summary  DATE OF BIRTH:  11-04-1940  FINAL DIAGNOSES: 1. Admitted October 27, 2001, via the emergency room after she presented with    several-day history of weakness and shortness of breath.  Evaluation    revealed anemia with hemoglobin 7.2 and hemoccult positive stools.  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    Similar findings this admission with endoscopy and Argon laser coagulation    of angiodysplasia by Venita Lick. Pleas Koch., M.D. [facility] 2. History of gastroesophageal reflux disease. 3. History of diverticulosis. 4. History of asthma with chronic dyspnea. 5. Morbid obesity. 6. History of anxiety and depression with previous hospitalization in 2000    by Madie Reno A. Dub Mikes, M.D. with questionable bipolar disease. 7. History of fibromyalgia. 8. Elevated liver enzymes - ? fatty infiltration?[name]  BRIEF HISTORY:  The patient is a 70 year old white female with multiple medical problems as listed above.  She presented to the emergency room on the evening of October 27, 2001, with several-day history of shortness of breath and weakness which she stated was getting worse daily.  She denied cough, sputum production, fever, chills, sweats, chest pain, or palpitations.  She noticed dyspnea on exertion with minimal activity.  She was slightly dizzy and lightheaded especially when she was up and about.  She was quite anxious as well.  Evaluation in the emergency room by Dr. Estell Harpin of the emergency room staff showed no specific  cardiopulmonary findings, but her hemoglobin was 7.2 and stools for hemoccult positive.  Review of her old chart shows that she is a GI patient of Hedwig Morton. Juanda Chance, M.D. [facility].  She had a similar anemia and admission in April of 2001. Endoscopy at that time showed gastric AV malformations and so-called Gave syndrome (watermelon stomach).  She had Argon laser photocoagulation at that time.  She has been followed by Dr. Juanda Chance as an outpatient.  She also has a history of elevated liver enzymes and is said to have fatty infiltration in her liver with gastroesophageal reflux disease and diverticulosis noted on previous studies by Dr. Juanda Chance.  Review of chart indicated that she was seen in the emergency room over [place] 2002.  Her hemoglobin was 9.4 at that time.  PAST MEDICAL HISTORY:  She has a history of asthma and chronic dyspnea.  She is morbidly obese and has had difficulty losing weight.  She has a history of anxiety and depression and is said to be bipolar with hospitalization in 2000 by Madie Reno A. Dub Mikes, M.D. of the psychiatric service.  She has a history of fibromyalgia as well.  MEDICATIONS: 1. Nebulizer treatments. 2. Humibid. 3. Wellbutrin. 4. Prozac. 5. Prilosec. 6. Alprazolam. 7. Lasix. 8. Accolate. 9. Premarin.  ALLERGIES:  PENICILLIN, THEOPHYLINE.  PHYSICAL EXAMINATION:  A 70 year old white female in no acute distress.  She was somewhat anxious, chronically ill-appearing, and pale.  Blood pressure 150/90, no postural changes demonstrated, pulse 84 per minute and regular, respirations  20 per minute and not labored, O2 sat 98% on room air, temperature 98 degrees.  HEENT: Slightly pale conjunctivae, otherwise negative.  NECK:  No jugular venous distension, no carotid bruits, no thyromegaly or lymphadenopathy.  CHEST: Clear to auscultation and percussion without wheezes, rales, or rhonchi detected.  HEART: Regular rhythm, grade 1/6 systolic ejection murmur left sternal  border, no rubs or gallops heard. ABDOMEN: Slightly tender on palpation diffusely especially in the epigastrium. Bowel sounds were intact, there were no masses detected.  Stool was hemoccult positive.  EXTREMITIES: No clubbing, cyanosis, or edema.  NEUROLOGICAL: Intact without focal abnormalities detected.  LABORATORY DATA:  EKG showed normal sinus rhythm, nonspecific ST T wave changes, no acute abnormalities.  Chest x-ray revealed stable cardiomegaly, some mild chronic vascular congestion, and slight hyperinflation.  There was a linear scar in the lingula.  No acute changes detected.  Arterial blood gases on room air showed a pH of 7.47, pCO2 33, pO2 82.  Hemoglobin 7.6, hematocrit 23.7, white count 10,200 with 65% segs.  Pro-time 14.1, INR 1.1, PTT 29 seconds.  Sodium 135, potassium 4.0, chloride 104, CO2 24, BUN 11, creatinine 0.8, blood sugar 98, calcium 8.8, total protein 6.9, albumin 2.9, AST 100, ALT 57, alkaline phosphatase 143, total bilirubin 0.6, CPK 74 with negative MB and negative troponin.  Cholesterol 158, triglycerides 124, HDL 27, LDL 106, TSH 5.1.  Serum iron 58 with TIBC 487 for 12% saturation, B12 396.  Blood is O positive.  HOSPITAL COURSE:  The patient was admitted with anemia and GI bleed likely related to her known history of gastric talangiectasias and angiodysplasia with the Gave syndrome (watermelon stomach).  She was started on IV fluids, typed and crossed for packed cells, and continued on home medications including nebulizer treatments with Xopenex, Himibid, Wellbutrin, Prozac, Xanax, and given IV Protonix.  GI was consulted and the patient was seen by Dr. Russella Dar who agreed with the assessment and performed an endoscopy with laser photocoagulation of angiodysplasia.  This was done on October 28, 2001, with friable mucosa and AV malformations noted.  He felt that repeat endoscopy with further photocoagulation would be necessary in four to six weeks and wanted the  patient to follow up with Hedwig Morton. Juanda Chance, M.D. [facility] for this.  She received  two units of blood in the initial part of the hospital course and subsequently received 1000 mg of iron IV and 40,000 units of Erythropoietin to aid in red cell production.  The patient did well for the remainder of her hospital course, although, she remained weak and hard to motivate.  She was seen by case management team and Ms. Clarisa Fling and Ms. Lew arranged for visiting nurses and home health to aid in her home care.  She was felt to be MHB and ready for discharge on November 01, 2001.  DISCHARGE MEDICATIONS:  1. She will start on Niferex iron one tablet p.o. b.i.d.  2. Continue handheld nebulizer with albuterol q.i.d.  3. Himibid LA one or two tablets p.o. b.i.d.  4. Accolate 20 mg p.o. b.i.d.  5. Aerobid two puffs b.i.d. via aerochamber.  6. Wellbutrin 150 mg p.o. b.i.d.  7. Prozac 40 mg p.o. q.d.  8. Prilosec 20 mg p.o. b.i.d.  9. Alprazolam 0.5 mg p.o. t.i.d. 10. Lasix 40 mg p.o. q.d. 11. KCL one tablet p.o. q.d. 12. Premarin 1.25 mg p.o. q.d. as directed.  CONDITION ON DISCHARGE:  Improved.  FOLLOW-UP:  She will follow up with me in two weeks  for follow-up blood count and to arrange follow-up with Dr. Juanda Chance for repeat endoscopy and laser photocoagulation of gastric AV malformations per GI protocol. Dictated by:   Lonzo Cloud. Kriste Basque, M.D. [facility] Attending Physician:  Verdene Lennert DD:  11/01/01 TD:  11/03/01 Job: [id number]

## 2011-01-11 NOTE — Discharge Summary (Signed)
[facility]  Patient:    [name], [name]                       MRN: [id number] Adm. Date:  [id number] Disc. Date: [id number] Attending:  Judeth Cornfield Dictator:   Mike Gip, P.A.                           Discharge Summary  ADMISSION DIAGNOSES: 86. A 70 year old female with progressive microcytic anemia, symptomatic, with    heme-positive stools, rule out watermelon stomach/gave syndrome, rule out small    bowel lesion, rule out colonic lesion, though less likely. 2. Diverticular disease. 3. Gastroesophageal reflux disease. 4. Asthma. 5. Depression and anxiety. 6. Fibromyalgia. 7. New dysphagia, rule out stricture. 8. Mildly elevated liver function studies.  DISCHARGE DIAGNOSES: 1. Chronic gastrointestinal blood loss with severe anemia secondary to watermelon    stomach/gave syndrome. 2. Mildly elevated SGOT felt secondary to fatty liver. 2. Diverticular disease. 3. Gastroesophageal reflux disease. 4. Asthma. 5. Depression and anxiety. 6. Fibromyalgia. 7. New dysphagia, rule out stricture.  CONSULTATIONS:  None.  PROCEDURES: 1. Upper endoscopy and upper endoscopy with ______ 2. Upper abdominal ultrasound. 3. Transfusions.  HISTORY OF PRESENT ILLNESS:  [name] is a pleasant, 70 year old, white female, a  primary patient of Lonzo Cloud. Kriste Basque, M.D., also known to Hedwig Morton. Juanda Chance, M.D. The history is significant for asthma, hypertension, sinusitis, and GERD, as well as obesity and depression.  The patient has had a prior evaluation in August of 2000 for iron deficiency anemia and heme-positive stools.  She had undergone an upper endoscopy that showed antral gastritis.  Biopsy showed reactive changes, but no  Helicobacter pylori.  Colonoscopy was also done, which showed left colon diverticula and three diminutive polyps.  She has been on chronic b.i.d. Prilosec since that time and also on chronic iron supplements.  Her hemoglobin in  August was 9.  A follow-up hemoglobin in November was 11.0.  The hemoglobin was repeated in February and had dropped to 9.3 and last week on December 05, 1999, her hemoglobin had dropped to 7.2 with a hematocrit of 22.6, an MCV of 74, and platelets of 475. n the day of admission, she had no complaint of abdominal pain or changes in her bowel habits.  She says that here stools have been dark since on iron, but no obvious blood.  She said that she had been having some mild difficulty swallowing over the past few weeks, both solids and liquids, and also complained of progressive weakness and dyspnea with activity.  She was seen and evaluated in he office, appeared quite pale, and hemodynamically pale.  Stool was strongly heme  positive.  She was admitted to the hospital for further diagnostic work-up and transfusions.  LABORATORY DATA:  On admission, the hemoglobin was 6.7, hematocrit 23.2, MCV 77.5, platelets 466, and WBC 10.5.  Serial values were obtained on December 14, 1999. Post transfusion, the hemoglobin was 9.8 and hematocrit 31.8 and on December 15, 1999, hemoglobin 9.9 and hematocrit 31.4.  Pro time 13.7, INR 1.1.  Electrolytes within normal limits.  BUN 12, creatinine 0.8, calcium 8.3, albumin 2.7, SGOT 41, SGPT 21, and otherwise unremarkable.  The folate level was 10.3, serum iron low at 21, TIBC 506, iron saturation 14, and B12 level 345.  X-RAY STUDIES:  Upper abdominal ultrasound showed no focal liver or gallbladder  abnormalities.  There was mild fatty infiltration  of the liver.  HOSPITAL COURSE:  The patient was admitted to the service of Barbette Hair. Arlyce Dice, M.D., who was covering the hospital.  She was kept on a regular diet.  She was scheduled for upper endoscopy the following morning because of suspicion for underlying gave syndrome given the chronic antral gastritis noted on prior endoscopy.  It was felt that this may be the source for her chronic blood loss.  She was  also transfused two units of packed rbcs that evening and maintained on  her chronic medications.  We also obtained an upper abdominal ultrasound given er mildly elevated transaminases with findings as outlined above.  She did undergo  upper endoscopy with Dr. Arlyce Dice the following morning and was found to have deeply injected, thickened, reddened gastric folds in the body and the antrum. Biopsies were taken.  These returned showing unremarkable gastric mucosa with mild focal  hyperemia.  There were no definite diagnostic findings of antral vascular ectasia. No Helicobacter pylori identified.  These were available not available for several days.  In the interim, the patient was rescheduled the following morning for ______ treatment given the likelihood of watermelon stomach.  She underwent laser ablation per Dr. Arlyce Dice.  She did require two further units of packed rbcs and tolerated these well.  On December 14, 1999, she was still complaining of some burning epigastric discomfort post ______ and some nausea.  We offered to keep her in the hospital for 24 hours for antiemetics, etc.  The following day on December 15, 1999, she was stable and had no further evidence of bleeding.  Her hemoglobin had improved again post transfusion.  She was feeling better and was able to tolerate p.o.  DISPOSITION:  She was allowed discharge to home.  FOLLOW-UP:  Instructed to follow up in the office with Hedwig Morton. Juanda Chance, M.D. She was also to have follow-up endoscopy scheduled with Dr. Juanda Chance in four weeks. e were to repeat her hemoglobin in two weeks.  DIET:  Regular.  DISCHARGE MEDICATIONS:  1. Carafate 1 g q.i.d.  2. Nu-Iron one p.o. t.i.d.  3. Inhalers as previous.  4. Premarin 0.125 mg p.o. q.d. as previous.  5. Prilosec 20 mg b.i.d.  6. Prozac 40 mg q.d.  7. Wellbutrin 100 mg b.i.d.  8. Xanax as previous.  9. Humibid as previous. 10. Lasix as previous.  CONDITION ON DISCHARGE:  Stable  and improved. DD:  12/27/99 TD:  12/29/99 Job: [id number]

## 2011-01-11 NOTE — Discharge Summary (Signed)
[name], [name]                ACCOUNT NO.:  [id number]   MEDICAL RECORD NO.:  [id number]          PATIENT TYPE:  INP   LOCATION:  6738                         FACILITY:  [facility]   PHYSICIAN:  Lonzo Cloud. Kriste Basque, M.D. [facility] OF BIRTH:  [birth year]   DATE OF ADMISSION:  09/24/2005  DATE OF DISCHARGE:  09/27/2005                                 DISCHARGE SUMMARY   CONTINUATION OF JOB [id number]:   PHYSICAL EXAMINATION:  GENERAL:  Revealed a chronically ill-appearing 70-  year-old white female who is morbidly obese.  VITAL SIGNS: Blood pressure 140/70; pulse 72 per minute and regular;  respirations 18 per minute, not labored; temperature 99.5.  HEENT: Unremarkable.  NECK:  Supple, without adenopathy, no jugular venous distension, no carotid  bruits.  CHEST:  Revealed coarse rhonchi bilaterally, with some expiratory wheezing.  No rales or signs of consolidation.  CARDIAC:  Revealed a regular rhythm, normal S1 and S2, without murmurs,  rubs, or gallops heard.  ABDOMEN:  Morbidly obese, but soft and nontender, without evidence of  organomegaly or masses.  EXTREMITIES:  Showed some venous insufficiency, trace edema. No cyanosis or  clubbing.  NEUROLOGIC:  Intact, without focal abnormalities detected.   LABORATORY DATA:  EKG showed normal sinus rhythm, nonspecific ST-T wave  changes. No acute abnormality. Chest x-ray showed patchy right lower lobe  infiltrate versus atelectasis and underlying COPD with mild cardiomegaly.  Hemoglobin 11.1, hematocrit 31.8, white count 4,800, with 91% segs. Sodium  131, potassium 3.7, chloride 99, CO2 22, BUN 10, creatinine 0.8, blood sugar  346.  Blood sugars were followed carefully throughout her hospital course,  and she was covered on sliding scale. Calcium 8.6, total protein 6.4,  albumin 2.9, AST 19, ALT 17, alk phos 104, total bilirubin 1.3, beta  natruretic peptide 79. Urinalysis showed small leukocyte esterase and few  bacteria. Sputum culture was no  growth.   HOSPITAL COURSE:  The patient was admitted with refractory COPD exacerbation  that was unresponsive to outpatient therapy. She was placed on inhaled  bronchodilators with albuterol and ipratropium via the nebulizer q.i.d. She  was continued on her home Accolate and guaifenesin. She was given IV Solu-  Medrol and IV Maxipime empirically. She responded to the regimen, and we  were able to wean the Solu-Medrol. Her blood sugars did bump up on this  therapy, and she was covered with Humalog sliding scale during the  hospitalization. As we weaned the Solu-Medrol to p.o. Medrol, the sugars  improved, and we feel that she can be discharged home on her usual diabetic  regimen. She was felt to be MHB and ready for discharge on September 27, 2005.   MEDICATIONS AT DISCHARGE:  1.  Ceftin 250 mg p.o. b.i.d. for 5 more days until gone.  2.  Medrol 8 mg p.o. q.a.m. until return office visit.  3.  Albuterol 2 sprays b.i.d.  4.  AeroBid 4 sprays b.i.d. via Aerochamber.  5.  Accolate 20 mg p.o. b.i.d.  6.  Guaifenesin 600 mg tablets, 2 tablets p.o. b.i.d.  7.  Lasix 40 mg p.o. q.a.m.  8.  K-20, 1 tablet p.o. b.i.d.  9.  Glucophage 500 mg p.o. b.i.d.  10. Imuran 50 mg p.o. q.d.  11. Prilosec 20 mg p.o. b.i.d.  12. Prozac 40 mg p.o. q.d.  13. Wellbutrin XL 300, 1 tablet a day.  14. Requip 0.5 mg p.o. q.h.s..   CONDITION AT DISCHARGE:  Improved. The patient will follow up in the office  next week and call for problems.      Lonzo Cloud. Kriste Basque, M.D. [facility]  Electronically Signed     SMN/MEDQ  D:  09/27/2005  T:  09/27/2005  Job:  [id number]

## 2011-01-11 NOTE — Letter (Signed)
Jan 21, 2007     RE:  [name], [name]  MRN:  [id number]  /  DOB:  [birth year]   To Whom It May Concern:   Mrs. [name] is a 70 year old patient of mine followed here for  general medical purposes. She has a history of diabetes and obesity and  has had quite a bit of difficulty losing weight because of her other  medical problems. She has been following a strict diabetic diet and  trying to exercise as best she can. She has been on two diabetic oral  agents including metformin and glimepiride with inadequate control.   Recently I started her on Januvia 100 mg p.o. daily. This is needed to  achieve the necessary control of her diabetes and keep her off of  insulin. The patient informs me that her insurance carrier has denied  the Januvia for reasons that are unclear to me. This letter is a request  for approval for Januvia 100 mg p.o. daily for her diabetes. The  patient's last hemoglobin A1c was 7.8% and the additional medication is  needed to get her hemoglobin A1c down into the 6's and hopefully avoid  the need for insulin shots.   Thank you very much for your consideration in this matter, if I can be  of further assistance please feel free to contact my office.    Sincerely,      Lonzo Cloud. Kriste Basque, MD  Electronically Signed    SMN/MedQ  DD: 01/21/2007  DT: 01/21/2007  Job #: [id number]

## 2011-01-11 NOTE — Discharge Summary (Signed)
NAME:  [name], [name]                          ACCOUNT NO.:  [id number]   MEDICAL RECORD NO.:  [id number]                   PATIENT TYPE:  INP   LOCATION:  5010                                 FACILITY:  [facility]   PHYSICIAN:  Lonzo Cloud. Kriste Basque, M.D. [facility]            DATE OF BIRTH:  Jun 18, 1941   DATE OF ADMISSION:  11/28/2003  DATE OF DISCHARGE:  12/05/2003                                 DISCHARGE SUMMARY   FINAL DIAGNOSES:  1. Admitted November 28, 2003, with chronic obstructive pulmonary disease     exacerbation and hospital evaluation revealing acute bronchitic     exacerbation of her underlying chronic bronchitis with superimposed     respiratory insufficiency and O2 saturations of 81% ambulating on room     air.  2. 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  3. 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  4. History of anemia secondary to above.  5. History of gastroesophageal reflux disease.  6. History of diverticulosis.  7. History of fibromyalgia.  8. History of restless leg syndrome.  9. History of anxiety and depression with question of bipolar disease in the     past.  10.      Status post appendectomy and hysterectomy.  11.      Allergy to penicillin and Theophyllin.  12.      Diabetes with steroid exacerbation.   HISTORY OF PRESENT ILLNESS:  The patient is a 70 year old white female with  COPD, morbid obesity and autoimmune liver disease followed by Dr. [name].  She presented to the office with a one-week history of increasing  cough, some beige sputum production, low grade fever with chills and sweats  increasing wheezing and dyspnea.  This had been unresponsive to outpatient  therapy including Levaquin and Tussionex.  She had complaints of severe  generalized weakness, increasing shortness of  breath with minimal activity  and unable to care for herself at home.  She denied any hemoptysis, chest  pain, leg swelling, PND, etc.  She was admitted for further evaluation and  treatment.   PAST MEDICAL HISTORY:  As noted above and includes underlying COPD and  asthmatic bronchitis.  She has morbid obesity and a component of restrictive  lung disease as well.  She has a history of autoimmune liver disease.  His  steatohepatitis and cirrhosis with portal hypertension and a history of  chronic GI blood loss, all followed by Dr. [name].  She has a history  of gastroesophageal reflux disease and diverticulosis.  She has a history of  fibromyalgia and restless leg syndrome.  She has a history of anxiety and  depression with a question of bipolar disease and is followed by psychiatry  on a number of medications.  PHYSICAL EXAMINATION:  Examination revealed a morbidly obese white female in  no acute distress.  Blood pressure 144/86, pulse 62 and regular, respiratory  rate 20 per minute and not labored, temperature 99.6, O2 saturation 95% on  admission.  HEENT examination was unremarkable.  Neck examination showed no  jugular venous distension, no carotid bruits, no thyromegaly or  lymphadenopathy.  Chest examination revealed coarse bilateral rhonchi and  wheezing.  Cardiac examination revealed regular rhythm with grade 1/6  systolic ejection murmur left sternal border.  No rubs or gallops heard.  The abdomen was obese but soft and nontender with intact bowel sounds.  No  evidence of organomegaly or masses.  Extremities showed no clubbing,  cyanosis, or edema.  Neurologic examination was intact.   LABORATORY DATA:  EKG showed sinus bradycardia with nonspecific STT wave  changes.   Chest x-ray showed the heart upper limits of normal in size, vascular  appeared normal with some accentuation of the interstitial markings with  some peribronchial  thickening and scarring at the lung  bases.   Hemoglobin 12.4, hematocrit 36.4, hematocrit 36.4, white count 3700 with 60%  segs.  Sodium 137, potassium 3.3, repeat 4.4; chloride 102, CO2 28, BUN 11,  creatinine 0.9, blood sugar 136, calcium 8.3, total protein 6.4, albumin  3.0, AST 121 with repeat 57, ALT 67 with repeat 51, alkaline phosphatase 127  with repeat 129, total bilirubin 0.9.  B natriuretic peptide 37.   HOSPITAL COURSE:  The patient was admitted with a COPD exacerbation and  respiratory insufficiency, evidencing hypoxemia with ambulation on room air.  She was placed on nebulizer treatments and IV Solu-Medrol along with IV  Avelox.  Guaifenesin was added for mucolytic along with liberal p.o. fluids.  She responded slowly to this regimen.  We were able to able to decrease the  Solu-Medrol in favor of oral Medrol and discontinue the IV Avelox in favor  of the oral Avelox.  She received a full seven-day course of the quinolone  therapy in the hospital and was weaned down to 8 mg of Medrol prior to  discharge.  She was seen by physical therapy and care management team, was  ambulated on the ward on room air and O2 saturations dropped to 81%.  She  was continued on oxygen and O2 saturations did not fall ambulating with  oxygen.  She was recommended to continue home oxygen two liters a minute  with exercise and one liter a minute at night.   The patient's blood sugar increased while on the IV Solu-Medrol.  She was  seen by nutrition and placed on a diabetic diet and extensively counseled  regarding diet therapy and exercise.  She was started on Metformin and  Glucotrol XL as well with improvement in her blood sugars and this will be  fine tuned as an outpatient.  In the hospital, she was covered on sliding  scale Humalog.   DISCHARGE MEDICATIONS:  1. Home oxygen one liter a minute at rest and two liters a minute with     exercise.  2. Home nebulizer treatments with triple combination medications Albuterol    2.5,  Atrovent 0.5, and budesonide 0.25 mg in the nebulizer q.i.d.     regularly.  3. Accolade 20 mg p.o. b.i.d.  4. Medrol 16 mg tablets one half tablet p.o. daily.  5. Aquatab DM 1200 mg one tablet p.o. b.i.d. with plenty of fluids.  6. Lasix 40 mg p.o. q.a.m.  7. K-Dur 20 one tablet  p.o. daily.  8. Propranolol 10 mg p.o. b.i.d.  9. Fluoxetine 40 mg p.o. daily.  10.      Wellbutrin XL 300 mg p.o. daily.  11.      Xanax 0.5 mg p.o. b.i.d.  12.      Imuran 50 mg p.o. daily.  13.      Omeprazole 20 mg p.o. daily.  14.      Requip 0.25 mg p.o. for leg cramps as before.  15.      Iron one tablet p.o. daily.  16.      Glucophage 500 mg p.o. b.i.d.  17.      Glucotrol XL 5 mg p.o. q.a.m.  18.      Darvocet-N 100 as needed for pain.   She was given an appointment to see me in the office on Friday, December 09, 2003, at 2 p.m.   CONDITION ON DISCHARGE:  Improved.                                                Lonzo Cloud. Kriste Basque, M.D. [facility]    SMN/MEDQ  D:  12/05/2003  T:  12/06/2003  Job:  [id number]

## 2011-01-11 NOTE — Discharge Summary (Signed)
[facility]  Patient:    [name], [name]                       MRN: [id number] Adm. Date:  [id number] Disc. Date: [id number] Attending:  Judeth Cornfield Dictator:   Antionette Fairy. Delane Ginger, [facility]                           Discharge Summary  PRIMARY CARE PHYSICIAN:  Lonzo Cloud. Kriste Basque, M.D.  GASTROENTEROLOGIST:  Hedwig Morton. Juanda Chance, M.D.  ADMITTING DIAGNOSES:  54. A 70 year old white female with progressive microcytic anemia. Symptomatic ith     heme positive stools. Rule out water melon stomach, small bowel lesion/AVM,  colon lesion (but less likely).  2. Diverticulosis.  3. Gastroesophageal reflux disease.  4. Asthma.  5. Depression and anxiety.  6. Fibromyalgia.  7. New onset dysphagia. Rule out stricture.  8. Mildly elevated liver tests.  DISCHARGE DIAGNOSES:  1. 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 Status post     endoscopy with laser therapy.  2. Mildly elevated SGOT likely secondary to fatty changes in the liver.  3. 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  4. Diverticulosis.  5. Gastroesophageal reflux disease.  6. Asthma.  7. History of depression and anxiety.  8. Fibromyalgia.  9. Hyperglycemia-resolved. 10. hypoalbuminemia.  PROCEDURE:  1. Upper endoscopy (December 12, 1999). Dr. Melvia Heaps. Changes consistent with     gastric antral vascular ectasia. Biopsies showed unremarkable gastric mucosa     with mild focal hyperemia. 2.  Upper endoscopy with argon plasma coagulation (December 13, 1999). Dr. Melvia Heaps.  CONSULTATIONS:  None.  BRIEF HISTORY:  [name] is a pleasant 70 year old white female primary care patient of Dr. Alroy Dust and also known to Dr. [name]. Her history is significant for asthma, hypertension, sinusitis, gastroesophageal reflux disease, obesity and depression. She was evaluated on August of 2000  for iron deficiency  anemia and heme positive stools. This workup included an endoscopy that showed antral gastritis as well as colonoscopy that showed left colon diverticulosis with three diminutive polyps that were hyperplastic by biopsy. She had been on chronic Prilosec for gastroesophageal reflux disease and is also on chronic iron supplements. Hemoglobin performed in August was 9.0. Follow-up hemoglobin in November was 11. Hemoglobin on February 2001 was 9.1 and on April 11, her hemoglobin had dropped to 7.2. Her MCV was 74 and platelet count 475. She was evaluated in the office on the day of admission with complaints of difficulty swallowing for several weeks. She also complained of progressive weakness and dyspnea with activity. She was found to be hemodynamically stable but appeared quite pale. Physical examination at the time revealed a stool that was strongly  heme positive. She was admitted by Dr. Sheryn Bison for transfusions and further workup.  LABORATORY DATA:  April 17:  White count 10.5, hemoglobin 6.7, hematocrit 23.2, MCV is 77.5 and platelet count of 466. PT 13.7, INR 1.1 and PTT of 27. Electrolytes  were within normal limits. Glucose was 125, BUN 12 and creatinine 0.8. Calcium level was 8.3. Liver function tests were normal except for an albumin of 2.7 and a mildly elevated AST of 41. Anemia studies revealed a serum folate of 10.3, iron of 21. The following day, further anemia studies were performed and showed an iron of 73, TIBC of  506%, saturation of 14 and a B12 of 345. Please note that these anemia studies on April 18 were performed after she received her blood transfusion. Hemoglobin at this time was 8.4 and hematocrit 27.6. April 19:  Hemoglobin 8.2,  hematocrit 27.2. April 20:  Hemoglobin 9.8, hematocrit 31.8. April 21: Hemoglobin 9.9 and hematocrit 31.4.  X-RAYS:  Abdominal ultrasound (December 12, 1999). RESULTS:  Fatty infiltration of the liver  but no focal lesions. Gallbladder within normal limits and no ductal dilatation.  HOSPITAL COURSE:  [name] was admitted to the service by Dr. Sheryn Bison. She was scheduled for an upper endoscopy for the following morning to be performed y Dr. Melvia Heaps. Initial labs revealed hemoglobin of 6.7. Other pertinent labs included a mildly elevated SGOT. For further evaluation of this, an abdominal ultrasound was performed and showed fatty infiltration of the liver but no lesions or ductal dilatation. Anemia studies were performed and showed an iron level of 21 and folate of 10.3. The remaining anemia studies were performed after her transfusion thus did not adequately reflect [name] pre-transfusion status. n admission, she was continued on her home medications that included multiple inhaler as well as Premarin, Nu-Iron, Lasix, Prozac, Wellbutrin and Prilosec. At this time, [name] was transfused with two units of packed red blood cells. Her hemoglobin the following morning was 8.4. She did undergo an upper endoscopy by Dr. Melvia Heaps on April 18 that showed changes compatible with water melon stomach as well as enlarged gastric folds in the fundus. Biopsies were taken and subsequently showed unremarkable gastric mucosa with mild focal hyperemia. The biopsy was rescheduled for April 19 additional endoscopy with argon laser therapy. She tolerated the procedure without complications. Prior to the procedure, she was given one more unit of packed red blood cells and her iron therapy was increased to t.i.d. Post procedure, she was complaining of some slight nausea and was given Phenergan. On April 20, her hemoglobin was stable at 9.8. By April 21, she had o complaints and was without any further bleeding. Her hemoglobin was stable at 9.9. She was discharged home in good condition with plans for a repeat endoscopy with Dr. Juanda Chance in four to six weeks.  CONDITION ON  DISCHARGE:  Improved.  DISCHARGE DIET:  Low fat low cholesterol diet.  MEDICATIONS:  Carafate 1 gm q.i.d., Nu-Iron one tablet t.i.d., inhalers as before,  Premarin 1.25 mg daily as before, Prilosec 20 mg b.i.d., Prozac 40 mg q.d., Wellbutrin 100 mg b.i.d., Xanax as before, Humibid as before, Lasix 40 mg q. day.  Approximate time to discharge this patient was 50 minutes. DD:  12/26/99 TD:  12/28/99 Job: [id number]

## 2011-01-11 NOTE — Assessment & Plan Note (Signed)
[facility]                                 ON-CALL NOTE   NAME:[name], [name]                         MRN:          [id number]  DATE:08/28/2006                            DOB:          1941/05/24    The caller's home number is [phone].   PROBLEM:  This is a diabetic patient of Dr. Jodelle Green who called this  evening because her blood sugar was 588.  About two months he had put  her on glimepiride.  Three weeks ago metformin was added b.i.d.  Glucose  at that time was running in the high 100s to 200s.  She ran out of her  metformin and had been off about three days until she got through to the  office and refill was called in today.  She had not yet picked it up  this evening when she called.  She had taken 1 mg of glimepiride this  morning.  She is alert and mainly anxious.   PLAN:  I told her to go ahead and get the metformin from the drug store  and take her usual p.m. dose tonight.  She may take a second glimepiride  1 mg now for a total of 2 mg today.  She will call Dr. Kriste Basque tomorrow  morning.  She was comfortable with this approach.     Clinton D. Maple Hudson, MD, Tonny Bollman, FACP  Electronically Signed    CDY/MedQ  DD: 08/28/2006  DT: 08/28/2006  Job #: [id number]   cc:   Lonzo Cloud. Kriste Basque, MD

## 2011-01-11 NOTE — Assessment & Plan Note (Signed)
[facility]                                 ON-CALL NOTE   NAME:Ulbrich, BRITTANI                         MRN:          [id number]  DATE:07/24/2006                            DOB:          22-Aug-1941    TIME OF CALL:  9:45 p.m.   PHONE NUMBER:  [phone].   PROBLEM:  This is a patient followed by Dr. Alroy Dust for lung disease  and diabetes who was concerned about her diabetes control. Dr. Kriste Basque had  recently changed from metformin to glimepiride because sugars were  running 2-300. She says her sugars have gradually gone up and tonight  was 400 so she panicked and felt that made her breathing worse. Her  husband has gone to the drugstore already tonight to get metformin which  she had been taking b.i.d. Her last glimepiride dose was this morning. I  recommended that she stop glimepiride and resume metformin starting  tonight and again in the morning until she could call Dr. Kriste Basque in the  morning. She was comfortable with this approach.     Clinton D. Maple Hudson, MD, Tonny Bollman, FACP  Electronically Signed    CDY/MedQ  DD: 07/24/2006  DT: 07/25/2006  Job #: [id number]   cc:   Lonzo Cloud. Kriste Basque, MD

## 2011-01-11 NOTE — Procedures (Signed)
[facility]  Patient:    [name], [name] Visit Number: [id number] MRN: [id number]          Service Type: END Location: ENDO Attending Physician:  Mervin Hack Dictated by:   Hedwig Morton. Juanda Chance, M.D. [facility] Admit Date:  03/03/2002 Discharge Date: 03/03/2002                             Procedure Report  PROCEDURE:  Upper endoscopy and esophageal dilatation.  INDICATIONS:  This 70 year old white female with portal hypertensive gastropathy and watermelon stomach developed acute pill dysphagia last night after taking two Humibid tablets.  She went to the emergency room and was sent home on clear liquids.  This morning she called again complaining of dysphagia and possibly both pills being retained in the esophagus.  She is now undergoing upper endoscopy and possibly the removal of foreign body and esophageal dilatation.  She has no previous history of esophageal stricture.  INSTRUMENT:  Olympus single-channel videoscope.  PREMEDICATION:  Versed 10 mg IV, Demerol 70 mg IV.  DESCRIPTION OF PROCEDURE:  The Olympus single-channel videoscope was passed routinely through the posterior pharynx into the esophagus.  The patient was monitored by pulse oximetry.  Oxygen saturation was satisfactory.  She was very cooperative.  Vallecula, piriform sinuses, glottis were unremarkable. Proximal and mid esophageal mucosa were normal.  There was no evidence of ______ or any foreign material, specifically, no pills.  A small amount of mucous was retained in the esophagus.  It was easily cleared.  At the GE junction the mucosa was normal.  There was no ulceration, stricture.  There was no resistance to passage of the scope.  There were no esophageal varices.  Stomach:  The stomach was insufflated with air and showed moderately severe changes of portal hypertensive gastropathy, predominantly in the gastric antrum, with watermelon-like mucosa in patches, coffee-grounds  material coating the mucosa with some soft nodularity in the gastric antrum. Retroflexion of endoscope revealed no evidence for gastric varices.  Duodenum:  The duodenal bulb and descending duodenum were normal.  Maloney dilator 50 French passed freely through the esophagus without resistance.  IMPRESSION: 1. No evidence for retained esophageal foreign body, status post passage of 50    [place] Maloney dilator. 2. Moderately severe portal hypertensive gastropathy.  PLAN:  The patients symptoms of dysphagia and odynophagia are likely related to the pill dysphagia and some microscopic mucosa abrasions which are not endoscopically apparent.  She has had mostly problems since discontinuation of her Xanax, which most likely acted as a smooth muscle relaxer.  This was replaced by Serax, and the patient is having more esophageal spasm and dysphagia.  We may try her on a soft diet, crushing her pills.  Consider returning her back to Xanax.  Xanax was discontinued because of her significant liver disease. Dictated by:   Hedwig Morton. Juanda Chance, M.D. [facility] Attending Physician:  Mervin Hack DD:  03/03/02 TD:  03/06/02 Job: [id number]

## 2011-01-11 NOTE — H&P (Signed)
NAMEBRAYDEE, SHIMKUS                ACCOUNT NO.:  [id number]   MEDICAL RECORD NO.:  [id number]          PATIENT TYPE:  INP   LOCATION:  6735                         FACILITY:  [facility]   PHYSICIAN:  Lonzo Cloud. Kriste Basque, M.D. [facility] OF BIRTH:  24-Jul-1941   DATE OF ADMISSION:  09/24/2005  DATE OF DISCHARGE:                                HISTORY & PHYSICAL   CHIEF COMPLAINT:  Shortness of breath, cough x3 weeks.   HISTORY OF PRESENT ILLNESS:  This is a very complication 70 year old white  female patient of Dr. Jodelle Green who has a history of COPD and asthmatic  bronchitis in a former smoker. The patient returns today complaining of a  persistent cough, wheezing, congestion, and shortness of breath. The patient  was initially seen on September 05, 2005 for a suspected asthmatic bronchitic  exacerbation. At that time she was started on a 5-day course of Avalox, a  Prednisone taper and given mucolytic's and Endal HD for cough. The patient  reports she only had minimal improvement in symptoms and called back in  complaining of persistent symptoms on September 17, 2005.  At that time a  Biaxin XL pack was called in which she finished yesterday. The patient  reports she has had no improvement in symptoms and continues to have severe  coughing paroxysms. She denies any hemoptysis, chest pain, orthopnea,  paroxysmal nocturnal dyspnea or leg swelling. 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 Juanda Chance. She does require frequent iron transfusions. Patient  is also on daily Imuran.   PAST MEDICAL HISTORY:  1.  Chronic obstructive pulmonary disease  and asthmatic bronchitis.  2.  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  3.  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  4.  Hypertension.  5.  Diabetes mellitus.  6.  Diverticulosis.  7.  Fibromyalgia.  8.  Anxiety and depression.  9.  Gastroesophageal reflux.   CURRENT MEDICATIONS:  1.  Xanax 0.5 mg b.i.d.  2.  Potassium chloride 20 mEq b.i.d.  3.  Prozac 40 mg daily.  4.  Wellbutrin XL 300 mg daily.  5.  Accolate 20 mg b.i.d.  6.  Lasix 40 mg b.i.d.  7.  Omeprazole 20 mg daily.  8.  AeroBid 3 puffs b.i.d.  9.  Imuran 50 mg daily.  10. Guaifenex DM b.i.d.  11. Requip 0.5 mg daily.  12. Glucophage 500 mg b.i.d.  13. Albuterol p.r.n.   ALLERGIES:  PENICILLIN, THEOPHYLLINE AND ASPIRIN.   SOCIAL HISTORY:  The patient is married. She is a former smoker. Denies any  alcohol use.   REVIEW OF SYSTEMS:  Essentially negative.   FAMILY HISTORY:  Noncontributory.   PHYSICAL EXAMINATION:  GENERAL:  The patient is a chronically ill-appearing,  morbidly obese white female in no acute distress.  HEENT:  Normocephalic, atraumatic. Pupils are equal, round and reactive to  light. Nasal mucosa is normal. Tympanic membranes normal. Posterior pharynx  is clear.  NECK:  Supple without cervical adenopathy. No JVD. Carotids are equal,  positive upstrokes bilaterally without any bruits. No thyromegaly or nodules  appreciated.  LUNGS:  Reveal coarse rhonchi bilaterally with expiratory wheezes.  CARDIAC:  S1, S2 without murmur, rub, or gallop.  ABDOMEN: Morbidly obese, soft, nontender. No hepatosplenomegaly.  EXTREMITIES:  Are warm without any calf tenderness, cyanosis, clubbing or  edema. Moves all extremities well.  NEURO:  Intact.   IMPRESSION AND PLAN:  1.  Acute exacerbation of asthmatic bronchitis. Patient has failed      outpatient therapy including two courses of antibiotics - Avelox and      Biaxin. She will require hospitalization for more aggressive therapy.      She will be admitted and started on oxygen therapy, nebulized      bronchodilators, IV steroids and IV Maxipime. The  patient is      immunosuppressed currently on Imuran for her history of autoimmune      hepatitis. Chest x-ray, sputum culture and labs are currently pending.  2.  Diabetes mellitus. Will currently hold Metformin and place on sliding      scale Insulin  due to her receiving IV steroids.      Rubye Oaks, NP [facility]      Scott M. Kriste Basque, M.D. [facility]  Electronically Signed    TP/MEDQ  D:  09/24/2005  T:  09/24/2005  Job:  [id number]

## 2011-01-11 NOTE — H&P (Signed)
NAME:  [name], [name]                          ACCOUNT NO.:  [id number]   MEDICAL RECORD NO.:  [id number]                   PATIENT TYPE:  INP   LOCATION:  5010                                 FACILITY:  [facility]   PHYSICIAN:  Lonzo Cloud. Kriste Basque, M.D. [facility]            DATE OF BIRTH:  17-Mar-1941   DATE OF ADMISSION:  11/28/2003  DATE OF DISCHARGE:                                HISTORY & PHYSICAL   CHIEF COMPLAINT:  Cough and shortness of breath x5 days.   HISTORY OF PRESENT ILLNESS:  The patient is a very complicated 70 year old  white female patient of Dr. Kriste Basque who has a known history of asthma,  bronchitis and is a former smoker.  The patient presents with for an acute  office visit with a productive cough, associated fever, chills, sweats,  wheezing and dyspnea on exertion.  The patient is currently on Levaquin, day  4 of 10, and Tussionex without any improvement of symptoms.  She continues  to complain of severe generalized weakness, shortness of breath with minimal  activity and she states that she is unable to care for herself at home.  She  denies any hemoptysis, chest pain, orthopnea, PND, leg swelling, recent  travel.  The patient has failed outpatient therapy, therefore will require  hospitalization.   PAST MEDICAL HISTORY:  1. 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  The patient is     followed by Dr. [name].  2. Anemia, secondary to number one.  3. Gastroesophageal reflux disease.  4. Diverticulosis.  5. Morbid obesity.  6. COPD and asthmatic bronchitis.  7. Anxiety/depression with a questionable history of bipolar disease.  8. Fibromyalgia.   PAST SURGICAL HISTORY:  Status post hysterectomy and appendectomy.   ALLERGIES:  1. PENICILLIN.  2. THEOPHYLLINE.   SOCIAL HISTORY:  The patient is married, is currently disabled.  She has  four children.  The patient quit smoking 10  years ago.  She denies any  alcohol use.   FAMILY HISTORY:  Positive for diabetes mellitus and coronary artery disease.   REVIEW OF SYMPTOMS:  Essentially negative, except as noted above.   PHYSICAL EXAMINATION:  GENERAL:  The patient is a morbidly obese white  female in no acute distress.  VITAL SIGNS:  Temperature 99.6; blood pressure 144/86; pulse regular at 60;  respiratory rate is 20; O2 saturation is 95% on room air.  HEENT:  Atraumatic, normocephalic.  Posterior pharynx with some mild  erythema and clear discharge.  Nasal mucosa mild erythema and clear  discharge.  Nontender sinuses on percussion.  TMs are normal.  Conjunctivae  are noninjected.  PERRL.  NECK:  Supple without cervical adenopathy.  No JVD.  Carotids are equal with  positive upstrokes bilaterally.  No bruits.  No thyromegaly or nodules  appreciated.  LUNG SOUNDS:  Coarse rhonchi bilaterally with  expiratory wheezes.  CARDIAC:  Regular rate and rhythm with a grade 1/6 systolic murmur.  ABDOMEN:  Morbidly obese, soft and nontender with positive bowel sounds.  EXTREMITIES:  Warm without any catches, cyanosis, clubbing or edema.   IMPRESSION AND PLAN:  Acute exacerbation of asthmatic bronchitis.  The  patient has failed outpatient therapy.  Therefore she will require  hospitalization.  She will start on IV Avelox, Solu-Medrol 80 mg q.8h. and  albuterol and Atrovent nebulizer.  Chest x-ray and labs are currently  pending, including a BNP.      Tammy Parrett, P.A. [facility]                   Scott M. Kriste Basque, M.D. [facility]    TP/MEDQ  D:  11/28/2003  T:  11/28/2003  Job:  [id number]   cc:   Lonzo Cloud. Kriste Basque, M.D. [facility]

## 2011-01-11 NOTE — Discharge Summary (Signed)
[name], [name]                ACCOUNT NO.:  [id number]   MEDICAL RECORD NO.:  [id number]          PATIENT TYPE:  INP   LOCATION:  6738                         FACILITY:  [facility]   PHYSICIAN:  Lonzo Cloud. Kriste Basque, M.D. [facility] OF BIRTH:  [birth year]   DATE OF ADMISSION:  09/24/2005  DATE OF DISCHARGE:  09/27/2005                                 DISCHARGE SUMMARY   FINAL DIAGNOSES:  1.  Admitted September 24, 2005 with chronic obstructive pulmonary disease      exacerbation unresponsive to outpatient therapy.  The patient required      inhaled bronchodilators, intravenous Solu-Medrol, and intravenous      Maxipime with good response.  The patient being discharged on oral      Ceftin and Medrol with outpatient followup planned.  2.  History of mixed obstructive and restrictive lung disease secondary to      underlying asthmatic bronchitis and morbid obesity.  3.  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 [name].  4.  History of anemia requiring iron infusions because of #2 above.  5.  History of gastroesophageal reflux disease.  6.  History of diverticulosis.  7.  History of fibromyalgia.  8.  History of restless leg syndrome.  9.  History of anxiety and depression with question of bipolar disease in      the past.  10. Status post appendectomy and hysterectomy.  11. Diabetes, on oral agents with steroid exacerbation this admission,      covered on sliding scale.  12. Allergy to penicillin and theophylline.   BRIEF HISTORY AND PHYSICAL:  The patient is a 70 year old white female with  a history of COPD and asthmatic bronchitis, and a former smoker.  She had  been seen recently with an upper respiratory infection and exacerbation of  her COPD.  She was placed on Avelox, mucolytics and Endal cough syrup.  She  returned stating she was no better with progressive  dyspnea and severe  coughing paroxysms.  She denied any chest pain, hemoptysis, PND, orthopnea  or leg swelling.   PAST MEDICAL HISTORY:  As noted, she has a history of COPD and mixed  restrictive lung disease due to her underlying asthmatic bronchitis and  morbid obesity.  She is followed by Dr. [name] for autoimmune liver  disease with steatohepatitis and cirrhosis with question of portal  hypertension.  She has a history of chronic GI blood loss with  gastroesophageal reflux disease and diverticulosis on previous studies.  She  has a frequent anemia and is iron deficient and requires iron infusions to  keep up with her blood loss.  She is followed regularly by Dr. [name]  and was seen most recently in December, where her Imuran was decreased from  75 to 50 mg with improvement in her liver enzymes.  She has a history of  fibromyalgia and some degenerative arthritis.  She has a history of restless  leg syndrome and takes  Requip.  She has a long history of psychiatric  problems with anxiety and depression and a question of bipolar disease.  She  is on numerous medications from her psychiatrist including Prozac and  Wellbutrin.  She has diabetes and has been well-controlled on oral agents.  She has been unable to lose weight, however.  She has had previous  appendectomy and hysterectomy.   ALLERGIES:  She is allergic to PENICILLIN and THEOPHYLLINE.   PHYSICAL EXAMINATION:  Physical examination at the time of admission  revealed a chronically ill-appearing, morbidly obese white female in no  acute distress.  Blood pressure 140/70, pulse 72 and regular, respirations  18 per minute and not labored, temperature 99.5.  Her weight was not  measured.   DICTATION ENDED AT THIS POINT.      Lonzo Cloud. Kriste Basque, M.D. [facility]  Electronically Signed     SMN/MEDQ  D:  09/27/2005  T:  09/27/2005  Job:  [id number]

## 2011-01-11 NOTE — Assessment & Plan Note (Signed)
[facility]                           GASTROENTEROLOGY OFFICE NOTE   NAME:Brow, RAVINDER HOFLAND                       MRN:          [id number]  DATE:07/07/2006                            DOB:          December 08, 1940    Ms. Schnarr is a 70 year old white female with autoimmune liver disease,  positive ANA titer of 1:600, daughter who has lupus.  She has obesity,  gastroesophageal reflux disease, hepatomegaly, depression, and chronic GI  blood loss due to portal hypertension and AV malformation of the stomach,  status post ablation of her watermelon stomach at [place] and in [place].  She has had multiple iron infusions, the last one in August, 2007.  She  comes for followup of GI blood loss as well as abdominal pain.   MEDICATIONS:  1. Xanax 1 mg, up to 3 a day.  2 . Zomig 1 p.o. b.i.d.  1. Fluoxetine 400 mg p.o. daily.  2. Wellbutrin XL 300 mg p.o. daily.  3. Accolate 20 mg p.o. b.i.d.  4. Lasix 40 mg p.o. b.i.d.  5. Imuran 50 mg p.o. daily.   PHYSICAL EXAMINATION:  VITAL SIGNS:  Blood pressure 122/76, pulse 64, weight  219 pounds.  Patient presents with a 10 pound weight loss since last visit.  GENERAL:  She was alert and oriented and looked the best I had seen her.  She says she has been feeling very well, especially since the iron infusion  two months ago.  Her hemoglobin came up from 11.3 up to 12.8, and her iron  saturation from 14 to 19.8%.  She denies any abdominal pain, any nausea or  vomiting.  LUNGS:  Clear to auscultation.  Jugular veins were not distended.  HEENT:  Edentulous teeth in upper palates.  COR:  Rapid S1 and S2.  No murmur.  ABDOMEN: Obese but soft and nontender with large liver 4 cm below the right  costal margin.  No ascites.  Some ecchymosis of the lower abdomen.  RECTAL:  Heme-negative stool.  EXTREMITIES:  Trace to 1+ edema bilaterally.   IMPRESSION:  64. A 70 year old white female with hepatomegaly, autoimmune liver  disease,      stable.  No esophageal varices on endoscopy but portal hypertension and      gastropathy on endoscopic examination, the last one October 19, 2004.  2. A two-day Hemoccult negative stool and stable hemoglobin and      hematocrit.  3. Depression, currently under good control.  4. Peripheral fluid retention, on Lasix 60 mg a day.   PLAN:  1. Increase Lasix to 80 mg in one dose daily.  2. Continue Imuran 50 mg p.o. daily.  3. CBC, iron studies, and renal profile the first week in January, 2008.  4. A  new prescription for Protonix 40 mg a day.     Hedwig Morton. Juanda Chance, MD  Electronically Signed    DMB/MedQ  DD: 07/07/2006  DT: 07/07/2006  Job #: [id number]   cc:   Lonzo Cloud. Kriste Basque, MD

## 2011-01-11 NOTE — Procedures (Signed)
[facility]  Patient:    [name], [name] Visit Number: [id number] MRN: [id number]          Service Type: END Location: ENDO Attending Physician:  Mervin Hack Dictated by:   Hedwig Morton. Juanda Chance, M.D. [facility] Proc. Date: 12/08/01 Admit Date:  12/08/2001                             Procedure Report  PROCEDURE:  Upper endoscopy with Erbe coagulation of angiodysplasia.  SURGEON:  Hedwig Morton. Juanda Chance, M.D.  INDICATIONS:  This is a 70 year old white female with watermelon stomach of the gastric as well as duodenal mucosa.  Has been undergoing Erbe coagulation of the angiodysplasia for the past two years.  Many treatments have been carried out at [facility], but these have been completed approximately six months ago, and she presented again with a drop in hemoglobin and hematocrit requiring blood transfusion.  Last treatment with Erbe coagulator was carried out approximately six weeks ago, and she is scheduled for repeat treatments. The past two days, the patient has been feeling weak and nauseated.  She is on proton pump inhibitors and ACE inhibitors.  Recent liver biopsy was done because of abnormal liver function tests and showed early cirrhosis, nonalcohol-related.  ENDOSCOPE:  Olympus single channel video endoscope.  SEDATION:  Versed 10 mg IV and fentanyl 100 mcg IV.  FINDINGS:  The Olympus single channel video endoscope was passed under direct vision through the posterior pharynx into the esophagus.  The patient was monitored by pulse oximeter.  Her oxygen saturations were normal.  She was cooperative.  Proximal and distal esophageal mucosa was at this time unremarkable.  There were no esophageal varices or esophagitis.  There was no significant hiatal hernia.  Stomach - the stomach was insufflated with air.  The proximal stomach and the corpus of the stomach was at this time normal.  There were no angiodysplasia in the corpus of the stomach, but in the  gastric antrum, there were large folds covered with exudate, and superficial ulcerations with bleeding spots, some clotted blood, as well as fresh blood exuding from the top of the folds. These were the areas of recently treated angiodysplasia.  Erbe coagulator was applied to at least five small areas of the gastric antrum and prepyloric antrum, as well as pyloric orifice which appeared to be bleeding.  Duodenum - duodenal bulb and descending duodenum __________ and was unremarkable with only superficial foci of erythema, but no bleeding.  After achieving hemostasis in the gastric antrum, the endoscope was retracted, and the stomach was decompressed.  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  PLAN: 1. Continue on Prevacid 30 mg p.o. b.i.d. 2. Carafate 1 g p.o. q.i.d. 3. Check CBC today. 4. Oral iron supplements on continuous basis. 5. Recheck hemoglobin every four weeks to prevent development of significant    anemia. Dictated by:   Hedwig Morton. Juanda Chance, M.D. [facility] Attending Physician:  Mervin Hack DD:  12/08/01 TD:  12/08/01 Job: [id number]

## 2011-01-11 NOTE — Assessment & Plan Note (Signed)
[facility]                           GASTROENTEROLOGY OFFICE NOTE   NAME:[name], [name]                       MRN:          [id number]  DATE:04/24/2006                            DOB:          [birth year]    [name] is a 70 year old white female with portal hypertension and chronic  liver disease, portal hypertensive gastropathy, watermelon stomach and  chronic GI blood loss.  She has autoimmune liver disease which has been  controlled now with Imuran 50 mg a day.  Her liver function tests have been  either normal or slightly elevated.  She is quite depressed and anxious and  always cries during her visits to our office.  She also has had multiple  multi-systemic complaints which are sometimes difficult to sort out.  Her  husband always comes with her in the office.  This time, she again cried and  feels bad; this pertains to some cramps in her legs as well as dysphagia  problems and abdominal pain.  Her last upper endoscopy was October 19, 2004  with findings of angiodysplasia of the stomach and the duodenum.  She  underwent early ablation of the pseudopolyps in the watermelon stomach.  Her  hemoglobin maintained around 12 g for a while, but it most recently has  dropped to 11.4 with iron saturation dropping from 19% to 14%.   PHYSICAL EXAM:  VITAL SIGNS:  Blood pressure 142/74, pulse 64 and weighed  230 pounds.  GENERAL:  She was obese, somewhat pale.  She has nasal oxygen at 2 L per  minute.  LUNGS:  Clear to auscultation.  COR:  Normal S1 and normal S2.  ABDOMEN:  Obese, mildly tender in epigastrium.  RECTAL:  Exam showed strongly Hemoccult-positive stool.  EXTREMITIES:  Varicose veins, no pedal edema.   IMPRESSION:  1. Progressive anemia due to gastrointestinal blood loss, arteriovenous      malformation in the stomach and duodenum.  2. Autoimmune liver disease, currently reasonably well-controlled on      Imuran.   PLAN:  1.  Schedule iron infusion.  2. Increase Xanax to 1 mg p.o. t.i.d., prescription given.  3. Continue iron supplements.  4. Continue Prozac 20 mg daily.  5. After iron infusion, the patient will need repeat hemoglobin within 4      weeks.                                  Hedwig Morton. Juanda Chance, MD   DMB/MedQ  DD:  04/24/2006  DT:  04/25/2006  Job #:  [id number]   cc:   Lonzo Cloud. Kriste Basque, MD

## 2011-01-11 NOTE — Assessment & Plan Note (Signed)
[facility]                         GASTROENTEROLOGY OFFICE NOTE   NAME:Baldi, KAITHLYN TEAGLE                       MRN:          [id number]  DATE:10/16/2006                            DOB:          1941/03/19    October 16, 2006   Ms. Mizrachi is a 70 year old white female with  multiple medical problems.  We have followed her for chronic autoimmune hepatitis with cirrhosis.  She is followed by Dr. Kriste Basque for high blood pressure, asthmatic  bronchitis, depression, diabetes, obesity.  She has chronic iron-  deficiency anemia requiring iron infusion,  last infusion last month.  Her hemoglobin actually has come up from 11.7 to currently 12.4 g.  Platelet count 139,000.  She has mild coagulopathy due to her chronic  liver disease.  She was told by her dentist, who retired, that she will  need extraction of all her remaining teeth.  She would like a referral  to oral surgeon.   MEDICATIONS:  1. Xanax 1 mg p.o. b.i.d.  2. Glimepiride 1 mg p.o. daily.  3. Potassium 20 mEq p.o. b.i.d.  4. Prozac 40 mg p.o. daily.  5. Wellbutrin XL 300 mg p.o. daily.  6. Accolate 20 mg p.o. b.i.d.  7. Lasix 40 mg p.o. b.i.d.  8. Omeprazole 20 mg p.o. q.a.m.  9. Imuran 50 mg p.o. daily.  10.Requip 2 mg p.o. nightly.  11.Glucophage 500 mg p.o. b.i.d.  12.Gentiva 10 mg p.o. daily.  13.She is also on p.r.n. Darvocet, albuterol, Afrin spray and Advair.   PHYSICAL EXAMINATION:  GENERAL APPEARANCE:  She was alert and oriented,  in no distress.  VITAL SIGNS:  Blood pressure 124/78, pulse 72, weight 217 pounds.  LUNGS:  Clear to auscultation.  CARDIOVASCULAR:  Normal S1 and normal S2.  ABDOMEN:  Obese, soft, tender in the epigastrium and to the left of the  epigastrium.  Subxiphoid area she had prominent venous pattern of  anterior abdomen and no ascites.  RECTAL:  Soft trace Hemoccult positive stool.  EXTREMITIES:  No edema.   IMPRESSION:  70. A 70 year old white female with  stable hemoglobin after iron      infusion.  2. Chronic gastrointestinal blood loss due to arteriovenous      malformations and portal hypertension.  3. History of watermelon stomach, status post Erbe ablation of the      arteriovenous malformations of the stomach.  4. Recurrent nose bleeds, currently under care of Dr. Lucky Cowboy.  5. Patient needs evaluation by oral surgeon as to teeth extractions.      Depending on the extent of the surgery, she may need inpatient      hospitalization to accomplish that plan.   I will see her again in three months.  Continue all her medications at  this time.  She will have her hemoglobin and hematocrit checked in four  weeks to determine if another iron infusion is indicated.     Hedwig Morton. Juanda Chance, MD  Electronically Signed    DMB/MedQ  DD: 10/16/2006  DT: 10/16/2006  Job #: [id number]   cc:   Lonzo Cloud. Kriste Basque, MD  Dorthula Matas, D.D.S.

## 2011-01-11 NOTE — H&P (Signed)
NAME:  [name], [name]                          ACCOUNT NO.:  [id number]   MEDICAL RECORD NO.:  [id number]                   PATIENT TYPE:  EMS   LOCATION:  [facility]                                 FACILITY:  [facility]   PHYSICIAN:  Tammy Parrett, P.A. [facility]             DATE OF BIRTH:  1941-04-08   DATE OF ADMISSION:  11/08/2003  DATE OF DISCHARGE:  11/09/2003                                HISTORY & PHYSICAL   ADDENDUM:  Addendum to Dictation number [id number]   DISCHARGE MEDICATIONS:  1. Xanax 0.5 mg b.i.d.  2. Potassium chloride 20 mEq daily.  3. Fluoxetine 40 mg daily.  4. Wellbutrin XL 300 mg daily.  5. Accolate 20 mg b.i.d.  6. Lasix 40 mg daily.  7. Propranolol 10 mg b.i.d.  8. Omeprazole 20 mg daily.  9. Aerobid 2 puffs b.i.d.  10.      Imuran 50 mg daily.  11.      Guaifenex 1200 DM b.i.d.  12.      Requip 0.25 mg q.a.m. and 50 mg q.p.m.  13.      Iron 300 mg daily.  14.      Darvocet p.r.n.  15.      Promethazine p.r.n.  16.      Albuterol nebulizer p.r.n.                                                Tammy Parrett, P.A. [facility]    TP/MEDQ  D:  11/28/2003  T:  11/28/2003  Job:  [id number]

## 2011-01-17 ENCOUNTER — Other Ambulatory Visit: Payer: Self-pay | Admitting: Pulmonary Disease

## 2011-01-23 ENCOUNTER — Other Ambulatory Visit: Payer: Self-pay | Admitting: Pulmonary Disease

## 2011-01-29 ENCOUNTER — Telehealth: Payer: Self-pay | Admitting: Pulmonary Disease

## 2011-01-29 NOTE — Telephone Encounter (Signed)
Pt last seen by SN 4.17.12.  Called spoke with patient to verify medications and dosages.  Samples of advair 250-34mcg and januvia 100mg  left up front for pt/spouse to pick up at their convenience.  Pt okay with this and verbalized her understanding.

## 2011-01-30 ENCOUNTER — Other Ambulatory Visit: Payer: Self-pay | Admitting: Pulmonary Disease

## 2011-02-07 ENCOUNTER — Encounter: Payer: Self-pay | Admitting: Pulmonary Disease

## 2011-02-26 ENCOUNTER — Telehealth: Payer: Self-pay | Admitting: Pulmonary Disease

## 2011-02-26 NOTE — Telephone Encounter (Signed)
Samples up front for pick up and pt aware.

## 2011-03-25 ENCOUNTER — Other Ambulatory Visit: Payer: Self-pay | Admitting: Pulmonary Disease

## 2011-03-28 ENCOUNTER — Telehealth: Payer: Self-pay | Admitting: Pulmonary Disease

## 2011-03-28 NOTE — Telephone Encounter (Signed)
Samples left up front for pt.  Called and made pt aware.

## 2011-04-11 ENCOUNTER — Other Ambulatory Visit: Payer: Self-pay | Admitting: Pulmonary Disease

## 2011-05-01 ENCOUNTER — Telehealth: Payer: Self-pay | Admitting: Pulmonary Disease

## 2011-05-01 NOTE — Telephone Encounter (Signed)
Samples left at front. Pt aware. Jennifer Castillo, CMA  

## 2011-05-07 ENCOUNTER — Other Ambulatory Visit: Payer: Self-pay | Admitting: Pulmonary Disease

## 2011-05-21 LAB — BASIC METABOLIC PANEL
BUN: 11
BUN: 12
Calcium: 8.9
Calcium: 9.4
Creatinine, Ser: 0.95
GFR calc non Af Amer: 59 — ABNORMAL LOW
GFR calc non Af Amer: >60
GFR calc non Af Amer: >60
Glucose, Bld: 155 — ABNORMAL HIGH
Glucose, Bld: 182 — ABNORMAL HIGH
Potassium: 3.6
Potassium: 3.6
Sodium: 138
Sodium: 141

## 2011-05-21 LAB — POCT I-STAT, CHEM 8
Glucose, Bld: 68 — ABNORMAL LOW
HCT: 29 — ABNORMAL LOW
Hemoglobin: 9.9 — ABNORMAL LOW
Potassium: 3.4 — ABNORMAL LOW
Sodium: 141

## 2011-05-21 LAB — PROTIME-INR: Prothrombin Time: 14.2

## 2011-05-21 LAB — CBC
HCT: 25 — ABNORMAL LOW
HCT: 27.5 — ABNORMAL LOW
Hemoglobin: 8.4 — ABNORMAL LOW
Hemoglobin: 9.3 — ABNORMAL LOW
Platelets: 122 — ABNORMAL LOW
Platelets: 136 — ABNORMAL LOW
RDW: 17.8 — ABNORMAL HIGH
RDW: 18.1 — ABNORMAL HIGH
WBC: 4.2

## 2011-05-21 LAB — POCT CARDIAC MARKERS
Operator id: 222501
Troponin i, poc: <0.05

## 2011-05-21 LAB — LIPID PANEL
HDL: 32 — ABNORMAL LOW
Total CHOL/HDL Ratio: 4.3
VLDL: 17

## 2011-05-21 LAB — HEPATIC FUNCTION PANEL
ALT: 17
AST: 24
Alkaline Phosphatase: 88
Bilirubin, Direct: <0.1
Total Bilirubin: 0.8

## 2011-05-21 LAB — CARDIAC PANEL(CRET KIN+CKTOT+MB+TROPI): CK, MB: 3.3

## 2011-05-21 LAB — CK TOTAL AND CKMB (NOT AT ARMC)
CK, MB: 3.5
Total CK: 78

## 2011-05-21 LAB — FERRITIN: Ferritin: 295 — ABNORMAL HIGH (ref 10–291)

## 2011-05-21 LAB — IRON: Iron: 154 — ABNORMAL HIGH

## 2011-05-22 LAB — COMPREHENSIVE METABOLIC PANEL
Albumin: 3.6
Alkaline Phosphatase: 85
BUN: 12
CO2: 28
Chloride: 101
Creatinine, Ser: 0.77
GFR calc non Af Amer: >60
Glucose, Bld: 229 — ABNORMAL HIGH
Potassium: 3.7
Total Bilirubin: 1

## 2011-05-22 LAB — URINE MICROSCOPIC-ADD ON

## 2011-05-22 LAB — URINALYSIS, ROUTINE W REFLEX MICROSCOPIC
Bilirubin Urine: NEGATIVE
Nitrite: NEGATIVE
Specific Gravity, Urine: 1.013
Urobilinogen, UA: 1
pH: 5.5

## 2011-05-22 LAB — CBC
HCT: 28.9 — ABNORMAL LOW
Hemoglobin: 9.9 — ABNORMAL LOW
MCV: 98.7
Platelets: 117 — ABNORMAL LOW
RBC: 2.93 — ABNORMAL LOW
WBC: 4.3

## 2011-05-22 LAB — RAPID URINE DRUG SCREEN, HOSP PERFORMED
Barbiturates: NOT DETECTED
Benzodiazepines: POSITIVE — AB

## 2011-05-22 LAB — ETHANOL: Alcohol, Ethyl (B): <5

## 2011-05-22 LAB — DIFFERENTIAL
Basophils Absolute: 0
Basophils Relative: 0
Lymphocytes Relative: 14
Monocytes Absolute: 0.3
Neutro Abs: 3.2
Neutrophils Relative %: 74

## 2011-05-23 ENCOUNTER — Other Ambulatory Visit: Payer: Self-pay | Admitting: Internal Medicine

## 2011-05-24 LAB — COMPREHENSIVE METABOLIC PANEL
ALT: 21
AST: 20
Alkaline Phosphatase: 100
CO2: 26
Calcium: 9.4
GFR calc Af Amer: >60
Potassium: 3.6
Sodium: 134 — ABNORMAL LOW
Total Protein: 6.6

## 2011-05-24 LAB — URINE MICROSCOPIC-ADD ON

## 2011-05-24 LAB — URINALYSIS, ROUTINE W REFLEX MICROSCOPIC
Bilirubin Urine: NEGATIVE
Glucose, UA: 250 — AB
Hgb urine dipstick: NEGATIVE
Specific Gravity, Urine: 1.013
pH: 6

## 2011-05-24 LAB — POCT I-STAT 3, ART BLOOD GAS (G3+)
Bicarbonate: 26.3 — ABNORMAL HIGH
TCO2: 27
pCO2 arterial: 37.2
pH, Arterial: 7.458 — ABNORMAL HIGH
pO2, Arterial: 64 — ABNORMAL LOW

## 2011-05-24 LAB — POCT I-STAT, CHEM 8
BUN: 14
Chloride: 103
Creatinine, Ser: 1
Potassium: 3.6
Sodium: 137
TCO2: 24

## 2011-05-24 LAB — CBC
Hemoglobin: 10.3 — ABNORMAL LOW
MCHC: 34.1
RBC: 3.1 — ABNORMAL LOW

## 2011-05-24 LAB — DIFFERENTIAL
Basophils Relative: 0
Eosinophils Absolute: 0.1
Eosinophils Relative: 3
Lymphs Abs: 0.7
Monocytes Relative: 8

## 2011-05-24 LAB — URINE CULTURE: Colony Count: >=100000

## 2011-05-24 LAB — KETONES, QUALITATIVE: Acetone, Bld: NEGATIVE

## 2011-05-28 ENCOUNTER — Ambulatory Visit: Payer: Medicare Other

## 2011-05-28 ENCOUNTER — Telehealth: Payer: Self-pay | Admitting: Pulmonary Disease

## 2011-05-28 LAB — URINALYSIS, ROUTINE W REFLEX MICROSCOPIC
Bilirubin Urine: NEGATIVE
Hgb urine dipstick: NEGATIVE
Nitrite: NEGATIVE
Specific Gravity, Urine: 1.009
Urobilinogen, UA: 1
pH: 7

## 2011-05-28 LAB — COMPREHENSIVE METABOLIC PANEL
ALT: 24
BUN: 11
CO2: 24
Calcium: 9.8
Creatinine, Ser: 0.93
GFR calc non Af Amer: >60
Glucose, Bld: 193 — ABNORMAL HIGH
Sodium: 141
Total Protein: 6.6

## 2011-05-28 LAB — GLUCOSE, CAPILLARY
Glucose-Capillary: 162 — ABNORMAL HIGH
Glucose-Capillary: 170 — ABNORMAL HIGH
Glucose-Capillary: 171 — ABNORMAL HIGH
Glucose-Capillary: 178 — ABNORMAL HIGH
Glucose-Capillary: 188 — ABNORMAL HIGH
Glucose-Capillary: 197 — ABNORMAL HIGH
Glucose-Capillary: 198 — ABNORMAL HIGH

## 2011-05-28 LAB — CBC
Hemoglobin: 10.6 — ABNORMAL LOW
MCHC: 33.1
MCV: 99.4
RBC: 3.21 — ABNORMAL LOW
RDW: 14.6

## 2011-05-28 LAB — URINE MICROSCOPIC-ADD ON

## 2011-05-28 NOTE — Telephone Encounter (Signed)
Spoke with pt and notified that samples are up front.

## 2011-05-29 LAB — GLUCOSE, CAPILLARY
Glucose-Capillary: 191 — ABNORMAL HIGH
Glucose-Capillary: 203 — ABNORMAL HIGH
Glucose-Capillary: 332 — ABNORMAL HIGH

## 2011-06-08 ENCOUNTER — Other Ambulatory Visit: Payer: Self-pay | Admitting: Pulmonary Disease

## 2011-06-08 ENCOUNTER — Other Ambulatory Visit: Payer: Self-pay | Admitting: Cardiovascular Disease

## 2011-06-12 ENCOUNTER — Other Ambulatory Visit (INDEPENDENT_AMBULATORY_CARE_PROVIDER_SITE_OTHER): Payer: Medicare Other

## 2011-06-12 ENCOUNTER — Encounter: Payer: Self-pay | Admitting: Pulmonary Disease

## 2011-06-12 ENCOUNTER — Ambulatory Visit (INDEPENDENT_AMBULATORY_CARE_PROVIDER_SITE_OTHER): Payer: Medicare Other | Admitting: Pulmonary Disease

## 2011-06-12 VITALS — BP 122/74 | HR 64 | Temp 98.2°F | Ht 64.0 in | Wt 224.8 lb

## 2011-06-12 DIAGNOSIS — R0989 Other specified symptoms and signs involving the circulatory and respiratory systems: Secondary | ICD-10-CM

## 2011-06-12 DIAGNOSIS — R0600 Dyspnea, unspecified: Secondary | ICD-10-CM

## 2011-06-12 DIAGNOSIS — F333 Major depressive disorder, recurrent, severe with psychotic symptoms: Secondary | ICD-10-CM

## 2011-06-12 DIAGNOSIS — K5520 Angiodysplasia of colon without hemorrhage: Secondary | ICD-10-CM

## 2011-06-12 DIAGNOSIS — I10 Essential (primary) hypertension: Secondary | ICD-10-CM

## 2011-06-12 DIAGNOSIS — J4489 Other specified chronic obstructive pulmonary disease: Secondary | ICD-10-CM

## 2011-06-12 DIAGNOSIS — IMO0001 Reserved for inherently not codable concepts without codable children: Secondary | ICD-10-CM

## 2011-06-12 DIAGNOSIS — D126 Benign neoplasm of colon, unspecified: Secondary | ICD-10-CM

## 2011-06-12 DIAGNOSIS — K754 Autoimmune hepatitis: Secondary | ICD-10-CM

## 2011-06-12 DIAGNOSIS — D500 Iron deficiency anemia secondary to blood loss (chronic): Secondary | ICD-10-CM

## 2011-06-12 DIAGNOSIS — I2510 Atherosclerotic heart disease of native coronary artery without angina pectoris: Secondary | ICD-10-CM

## 2011-06-12 DIAGNOSIS — M810 Age-related osteoporosis without current pathological fracture: Secondary | ICD-10-CM

## 2011-06-12 DIAGNOSIS — K219 Gastro-esophageal reflux disease without esophagitis: Secondary | ICD-10-CM

## 2011-06-12 DIAGNOSIS — E7800 Pure hypercholesterolemia, unspecified: Secondary | ICD-10-CM

## 2011-06-12 DIAGNOSIS — R0609 Other forms of dyspnea: Secondary | ICD-10-CM

## 2011-06-12 DIAGNOSIS — E119 Type 2 diabetes mellitus without complications: Secondary | ICD-10-CM

## 2011-06-12 DIAGNOSIS — R002 Palpitations: Secondary | ICD-10-CM

## 2011-06-12 DIAGNOSIS — J449 Chronic obstructive pulmonary disease, unspecified: Secondary | ICD-10-CM

## 2011-06-12 DIAGNOSIS — G2581 Restless legs syndrome: Secondary | ICD-10-CM

## 2011-06-12 DIAGNOSIS — M1990 Unspecified osteoarthritis, unspecified site: Secondary | ICD-10-CM

## 2011-06-12 DIAGNOSIS — I359 Nonrheumatic aortic valve disorder, unspecified: Secondary | ICD-10-CM

## 2011-06-12 DIAGNOSIS — K5730 Diverticulosis of large intestine without perforation or abscess without bleeding: Secondary | ICD-10-CM

## 2011-06-12 LAB — CBC WITH DIFFERENTIAL/PLATELET
Basophils Absolute: 0 10*3/uL (ref 0.0–0.1)
Eosinophils Absolute: 0.2 10*3/uL (ref 0.0–0.7)
Lymphocytes Relative: 13.1 % (ref 12.0–46.0)
Monocytes Relative: 12.1 % — ABNORMAL HIGH (ref 3.0–12.0)
Neutrophils Relative %: 69.8 % (ref 43.0–77.0)
Platelets: 92 10*3/uL — ABNORMAL LOW (ref 150.0–400.0)
RDW: 15.3 % — ABNORMAL HIGH (ref 11.5–14.6)

## 2011-06-12 LAB — BASIC METABOLIC PANEL
CO2: 26 mEq/L (ref 19–32)
Calcium: 8.9 mg/dL (ref 8.4–10.5)
Chloride: 105 mEq/L (ref 96–112)
Glucose, Bld: 245 mg/dL — ABNORMAL HIGH (ref 70–99)
Potassium: 3.6 mEq/L (ref 3.5–5.1)
Sodium: 137 mEq/L (ref 135–145)

## 2011-06-12 LAB — IBC PANEL
Iron: 35 ug/dL — ABNORMAL LOW (ref 42–145)
Saturation Ratios: 7.2 % — ABNORMAL LOW (ref 20.0–50.0)
Transferrin: 346.5 mg/dL (ref 212.0–360.0)

## 2011-06-12 LAB — HEPATIC FUNCTION PANEL
ALT: 32 U/L (ref 0–35)
AST: 26 U/L (ref 0–37)
Albumin: 3.3 g/dL — ABNORMAL LOW (ref 3.5–5.2)
Alkaline Phosphatase: 109 U/L (ref 39–117)
Total Protein: 6.6 g/dL (ref 6.0–8.3)

## 2011-06-12 LAB — VITAMIN D 25 HYDROXY (VIT D DEFICIENCY, FRACTURES): Vit D, 25-Hydroxy: 44 ng/mL (ref 30–89)

## 2011-06-12 LAB — TSH: TSH: 1.57 u[IU]/mL (ref 0.35–5.50)

## 2011-06-12 LAB — LIPID PANEL: HDL: 33.6 mg/dL — ABNORMAL LOW (ref >39.00)

## 2011-06-12 NOTE — Progress Notes (Signed)
Subjective:    Patient ID: Charlott Rakes, female    DOB: [birth year], 70 y.o.   MRN: [id number]  HPI 70 y/o WF here for a follow up visit... she has mult med problems as noted below>   ~  June 08, 2010:  c/o UTI symptoms w/ dark foul urine- we discussed UA, C&S, Rx w/ Cipro... 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 they checked her Hg=11.1, Fe=106 (stable), B12=472... unfortunately she has gained 15# up to 230# & notes incr SOB;  still very sedentary... she continues to see DrHejazi/ [name] for Psychiatry on 4 meds and stable.  ~  December 11, 2010:  18mo ROV w/ mult complaints including: sinus infection x2wks w/ pain & drainage; bladder infection w/ eval 12/11 by DrGrapey for chr cystitis & urge/ stress incont; wants new BS machine requesting "ultratouch" meter; had Neuro f/u DrDohmeier 12/11- tremors/ RLS/ hx hallucinbations> she tried Sinemet & Requip Rx, note reviewed...  She has SOB/ DOE w/ min activ but is too sedentary & not using her O2 despite O2 sat 87% on RA in office today (improved to 94% w/ rest)... Still on mult Psyche meds per [place] & [name]...  Labs due today> see below:  ~  June 12, 2011:  18mo ROV> she did not bring meds or list to the OV today...    COPD> c/o DOE & chest tightness, denies cough, wheezing, or sputum production; remote smoker quit yrs ago; known obstructive & restrictive lung dis; on Home O2, Advair250, NEBS, Mucinex; weight up 2# & very sedentary; under a lot of stress, etc...    HBP> on Aten, Lasix, K20; BP= 122/74 & she has mult somatic complaints; needs better low sodium, calorie restricted diet & wt reduction...    CAD & AS/AI> known non-obstructive CAD; hx atypCP/ CWP; no angina- too sedentary; discussed risk factor reduction strategy & she has f/u DrCooper  soon.    Chol> on Simva40; not really on diet & we discussed this; FLP today looked good (see below)...    DM> on Lantus10u?, Metform1000Bid, Glimep4, Januvia100; needs much better diet & exercise program! Not checking BS at home; BS=245, A1c=8.1 (worse than prev) & Pharm indicates that she is filling pretty regularly...    Obesity> way too sedentary, not on diet & wt continues to incr slowly; we discussed diet, exercise, wt reduction strategies...    {GERD, Angiodysplasia    {Divertics, polyps    {Autoimmune hepatitis    {Pancytopenia, Fe defic anemia> chr GI symptoms, followed by DrDBrodie, on Prilosec20Bid & Phenergan prn; labs showed LFTs wnl, Hg=9.5, Fe=35 (7%)> this is the lowest Fe since 12/10 & we will arrange for outpt infusion of Fe 1000mg  w/ repeat CBC/ Fe in 78mo...    DJD/ FM/ ?osteopenia/ VitD defic> she has mult somatic complaints & takes tylenol prn; on VitD 50K weekly...    RLS> on Requip4Qhs & Sinemet25/100Bid from DrDohmeier w/ fair control of symptoms...    Anxiety/ Depression/ Psyche> on RX per Psychiatry DrHejazi (we don't have notes from him) & she does not know her meds, didn't bring med bottles or med list!  As best we can tell she is on Nuvigil, Pristiq, Wellbutrin, Risperdal, Seroquel, Xanax...          Problem List:  COPD (ICD-496) & BRONCHITIS, RECURRENT (ICD-491.9) - Ex-smoker  w/ underlying COPD on home O2, ADVAIR 250Bid, ALBUTEROL (via nebs or MDI for Prn use), & MUCINEX 1-2Bid... she has combined obstructive AND restrictive disease (based on her obesity) w/ diet (weight reduction) + exercise strongly recommended to the pt...  ~  CTChest 5/06 = neg. ~  baseline CXR w/ bilat LL scarring, NAD.... last CXR 3/09 = unchanged, NAD. ~  PFTs 3/09 showed FVC= 2.47 (83%), FEV1= 1.65 (69%), %1sec= 67, mid-flows= 28%pred. ~  CXR 4/12 showed bilat scarring, NAD... ~  PFT 10/12 showed FVC= 2.28 (77%), FEV1= 1.46 (64%), %1sec= 64, mid-flows= 31% pred.  HYPERTENSION (ICD-401.9) -  on ATENOLOL 50mg /d,  LASIX 40mg Bid & K20/d...  ~  10/11:  BP=116/78 and she has mult somatic complaints but all are diminished compared to prev visits... denies bad HA's, CP, palpit, syncope, edema, etc... ~  4/12:  BP= 140/60 and she persists w/ mult somatic complaints... ~  10/12:  BP= 122/74 & she has mult somatic complaints; needs better low sodium, calorie restricted diet & wt reduction...  CAD (ICD-414.00) - Hosp at [facility] 4/09 w/ CP... cath showed non-obstructive CAD... no CP, palpit, etc (but she is way too sedentary). ~  cath 4/09 by DrCooper showed normLMAIN, 20% midLAD, norm CIRC, 20-30% proxRCA, EF= 50-55%...  PALPITATIONS (ICD-785.1) - eval by DrKlein w/ 7 beats WT on monitor... he changed Lisinopril to BBlocker (currently ATENOLOL 50mg /d) and improved... ~  she saw DrCopper for Cards f/u 1/11- palpit well controlled on Atenolol; hx non-obstructive CAD & good LVF; no change in Rx.  AORTIC STENOSIS (ICD-424.1) - she has mild Ao Valve disease w/ AS/ AI... followed by DrCooper. ~  2DEcho 11/06 showed mild ca++ AoV w/ mild AS, norm LVF w/ EF=55-65% ~  repeat 2DEcho 1/09 showed similar mild ca++ AoV w/ mild AS, mild AI, no regional wall motion abn, norm LVf w/ EF= 60%... NOTE: est PAsys= 35... ~  repeat 2DEcho 1/10 showed norm LVF w/ EF= 55-60%, no regional wall motion abn, mild DD, mod calcif AoV & mild reduced leaflet excursion & mild AI...  HYPERCHOLESTEROLEMIA (ICD-272.0) - on SIMVASTATIN 40mg /d... ~  FLP 6/09 showed TChol 107, TG 69, HDL 35, LDL 59 ~  FLP 3/10 showed TChol 128, TG 125, HDL 33, LDL 70 ~  FLP 12/10 showed TChol 98, TG 64, HDL 37, LDL 48... continue same. ~  FLP 10/11 showed TChol 124, TG 105, HDL 37, LDL 66 ~  FLP 4/12 showed TChol 122, TG 95, HDL 37, LDL 66 ~  FLP 10/12 on Simva40 showed TChol 110, TG 116, HDL 34, LDL 53  DM (ICD-250.00) - hx irreg control, not really on diet... on LANTUS 10u daily ?,  METFORMIN 500mg -2Bid,  GLIMEPIRIDE 4mg /d,  JANUVIA  100mg /d...  ~  labs 10/08 BS=198, HgA1C=7.2 ~  labs 1/09 showed BS=119, HgA1c=6.0 ~  labs 6/09 showed BS= 248, HgA1c= 7.0..[name] discussed need for starting insulin if she can't get on track and get wt down... ~  labs 9/09 showed BS= 236, HgA1c= 7.6.Marland KitchenMarland Kitchen rec- start Lantus 5u/d... grad increased to 10u. ~  labs 11/09 showed BS= 166, HgA1c= 6.0.Marland KitchenMarland Kitchen rec- great! keep same. ~  labs 3/10 showed BS= 169, A1c= 6.8 ~  labs 12/10 showed BS= 149, A1c= 5.8, Umicroalb= neg. ~  labs 10/11 showed BS= 215, A1c= 7.1.[name]. rec> better diet, get wt down. ~  Labs 4/12 showed BS= 172, A1c=5.8 ~  Labs 10/12 showed BS= 245, A1c= 8.1.Marland KitchenMarland Kitchen rec to take meds every day; incr Lantus dose 10==>  20u/d...  MORBID OBESITY (ICD-278.01) - not really on diet or exercise program... 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  ~  EGD  2/06 showed angiodysplasia & mult gastric pseudopolyps, watermelon stomach improved from 2003. ~  recent f/u EGD 9/09 showed chr gastritis, no watermelon stomach & improved from prev... ~  recurrent anemia 12/10 w/ f/u EGD 2/11 showing gastric antral avm's- s/p argon laser ablation, no varicies etc... 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  DIVERTICULOSIS OF COLON (ICD-562.10) & POLYP, COLON (ICD-211.3) -  ~  last colonoscopy 10/08 w/ adenomatous polyp removed, and angiodysplasia without hemorrhage ~  CT Abd 10/08  showed Cirrhosis, borderline splenomeg, sl pancreatic atrophy & duct dil, sm umbil hernia, DJD sp, NAD...  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 ~  10/11:  presents w/ dysuria & UTI Rx w/ Cipro... ~  12/11:  She had f/u DrGrapey...  OSTEOARTHRITIS (ICD-715.90)  FIBROMYALGIA (ICD-729.1) - she persists w/ mult somatic complaints...  ? of OSTEOPOROSIS (ICD-733.00) - she had normal BMD in 2001 at [place]..  RESTLESS LEGS SYNDROME (ICD-333.94) - she prev weaned off the Requip... ~  9/10: now c/o recurrent restless leg symptoms... restart REQUIP 2mg  & titrate up. ~  12/10: persistant symptoms on 2mg Qhs... rec to incr to 4mg ... ~  2/11: she reports resolution of RLS on 4mg  Requip Qhs, resting well now. ~  12/11:  Recurrent RLS symptoms despite the Requip & seen by DrDohmeier w/ addition of Sinemet 25/100 Bid... ~  2012:  Stable on Requip4mg  & Sinemet 25/100 Bid...  DYSTHYMIA (ICD-300.4) - Hx depression w/ psychotic features and hosp 2/09 at [facility] w/ delusions of pedunculosis... she was prev treated w/ RISPERDAL, Celexa, XANAX & Ambien by Dr. Bartholomew Crews & DrSuttenfield for counselling... Adm to [facility] in May09 by DrBarbSmith w/ depression and meds adjusted (see DC summary)... now followed by [facility] and [name] at [facility]- she was re-admitted 10/09 and meds changed to RISPERDAL, PRISTIQUE, BUPROPION, NUVIGIL, & XANAX...  husb indicates that she is much better at present & continues outpt Rx... ~  MRI/ [facility] Brain 2/08 showed sm vessel dis & atherosclerotic changes in post circ... ~  9/10:  pt indicates that she is off the Trazodone... DrHejazi added Wellbutrin ?for her RLS? ~  2011:  pt continues regular f/u w/ Psychiatry on numerous meds w/ freq med adjustments; she is requested to get notes  from [facility] to our attention & bring all med bottles to her OVs for review... ~  2012:  We have not received any notes from Psyche; pt has not complied w/ our request for med bottles to be brought to each OV...  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 ~  labs 04/18/08 by DrBrodie showed Hg= 10.4, Fe= 52... she ordered 1,200mg  IV Iron- given 04/26/08. ~  labs 05/19/08 showed Hg= 11.3, Fe= 117... ~  labs 11/09 showed Hg= 12.0.Marland KitchenMarland Kitchen ~  labs 3/10 showed Hg= 12.3, Fe= 79 ~  labs 6/10 showed Hg= 11.5, Fe= 73 ~  labs 12/10 showed Hg= 7.2, Fe= 28 (6%sat)... given [facility] & IV Fe per DrBrodie in Jan2012. ~  labs 2/11 showed Hg= 11.8, Fe= 95... pt indicates that DrBrodie will be following labs going forward... ~  labs 6/11 showed Hg= 11.2 ~  labs 9/11 showed Hg= 11.1, Fe= 106 ~  Labs 4/12 showed Hg=11.0, Fe= 118 ~  Labs 10/12 showed Hg= 9.5, Fe= 35 (7%sat)... We will arrange for outpt infusion 1000mg  Fe...   Past Surgical History  Procedure Date  . Hemorroidectomy   . Vaginal hysterectomy   . Appendectomy     Outpatient Encounter Prescriptions as of 06/12/2011  Medication Sig Dispense Refill  . albuterol (PROAIR HFA) 108 (90 BASE) MCG/ACT inhaler Inhale 2 puffs into the lungs every 6 (six) hours as needed.        . ALPRAZolam (XANAX) 1 MG tablet Take 1 mg by mouth at bedtime as needed.        . Armodafinil (NUVIGIL) 150 MG tablet Take 150 mg by mouth daily.        [name] atenolol (TENORMIN) 50 MG tablet TAKE 1 TABLET BY MOUTH EVERY DAY  30 tablet  10  . b complex vitamins tablet Take 1 tablet by mouth daily.        [name] buPROPion (ZYBAN) 150 MG 12 hr tablet Take  150 mg by mouth daily.        . carbidopa-levodopa (SINEMET CR) 25-100 MG per tablet 1 at lunch and 1 at dinner       . clotrimazole (LOTRIMIN) 1 % cream Apply topically 2 (two) times daily.        [name] desvenlafaxine (PRISTIQ) 50 MG 24 hr tablet Take 50 mg by mouth daily.        . Fluticasone-Salmeterol (ADVAIR DISKUS) 250-50 MCG/DOSE AEPB Inhale 1 puff into the lungs every 12 (twelve) hours.        . furosemide (LASIX) 40 MG tablet TAKE 1 TABLET TWICE DAILY  60 tablet  5  . glimepiride (AMARYL) 4 MG tablet Take 4 mg by mouth daily before breakfast.        . glucose blood test strip 1 each by Other route as needed. Use as instructed       . guaiFENesin (MUCINEX) 600 MG 12 hr tablet Take 1,200 mg by mouth 2 (two) times daily.        . insulin glargine (LANTUS SOLOSTAR) 100 UNIT/ML injection        . Insulin Pen Needle (B-D ULTRAFINE III SHORT PEN) 31G X 8 MM MISC by Does not apply route.        [name] KLOR-CON M20 20 MEQ tablet TAKE 1 TABLET TWICE DAILY  60 tablet  4  . metFORMIN (GLUCOPHAGE) 500 MG tablet TAKE 2  TABLETS BY MOUTH TWICE A DAY  120 tablet  4  . Multiple Vitamins-Minerals (CENTRUM SILVER PO) Take 1 tablet by mouth daily.        [name] omeprazole (PRILOSEC) 20 MG capsule TAKE 1 CAPSULE BY MOUTH 2 TIMES A DAY  60 capsule  0  . promethazine (PHENERGAN) 25 MG tablet Take 25 mg by mouth every 6 (six) hours as needed.        [name] QUEtiapine (SEROQUEL) 100 MG tablet 2 at bedtime       . risperiDONE (RISPERDAL) 3 MG tablet Take by mouth. Take 1/2 at bedtime       . rOPINIRole (REQUIP) 4 MG tablet Take 4 mg by mouth at bedtime.        . simvastatin (ZOCOR) 40 MG tablet TAKE 1 TABLET BY MOUTH AT BEDTIME FOR CHOLESTEROL  30 tablet  2  . sitaGLIPtan (JANUVIA) 100 MG tablet Take 100 mg by mouth daily.        . Vitamin D, Ergocalciferol, (DRISDOL) 50000 UNITS CAPS TAKE 1 CAPSULE WEEKLY  4 capsule  5  . DISCONTD: LANTUS SOLOSTAR 100 UNIT/ML injection INJECT 5 UNITS DAILY EACH MORNING  1 Syringe  6     Allergies  Allergen Reactions  . Aspirin     Causes nosebleeds  . Penicillins     REACTION: itching  . Theophylline     REACTION: nausea and vomiting    Current Medications, Allergies, Past Medical History, Past Surgical History, Family History, and Social History were reviewed in [employer] record.    Review of Systems         See HPI - all other systems neg except as noted...      The patient complains of weight gain, dyspnea on exertion, muscle weakness, difficulty walking, and depression.  The patient denies anorexia, fever, weight loss, vision loss, decreased hearing, hoarseness, chest pain, syncope, peripheral edema, prolonged cough, headaches, hemoptysis, abdominal pain, melena, hematochezia, severe indigestion/heartburn, hematuria, incontinence, suspicious skin lesions, transient blindness, unusual weight change, abnormal bleeding, enlarged lymph nodes, and angioedema.     Objective:   Physical Exam     WD, Obese, 70 y/o WF in NAD... she is chr ill appearing... GENERAL:  Alert & oriented x 3... HEENT:  [place]/AT, sl pale conjuct, EOM-wnl, PERRLA, EACs-clear, TMs-wnl, NOSE-clear, THROAT-clear w/ dry MMs... NECK:  Supple w/ fairROM; no JVD; normal carotid impulses w/o bruits; no thyromegaly or nodules palpated; no lymphadenopathy. CHEST:  Clear to P & A; without wheezes/ rales/ or rhonchi heard... HEART:  Regular Rhythm;  gr 1/6 SEM without rubs or gallops... ABDOMEN:  Obese, soft & nontender w/ panniculus; normal bowel sounds; no organomegaly or masses detected. EXT: without deformities, mod arthritic changes; no varicose veins/ +venous insuffic/ tr edema. NEURO:  CN's intact;  no focal neuro deficits... DERM:  No lesions noted; no rash, pale complexion.   Assessment & Plan:   COPD>  Combined obstructive & restrictive lung dis; PFT shows sl deterioration in FEV1; multifactorial dyspnea including stress; REC> continue Advair, NEBS, O2, Mucinex, etc;  get on diet/ exercise/ get weight down; continue Psyche f/u for help w/ anxiety...  HBP>  Controlled on BBlocker, diuretic> continue same & work on weight reduction...  CAD>  Known nonobstructive dis, way too sedentary> we discussed diet + exercise...  AS>  Stable w/o angina, syncope, etc; she has f/u DrCooper soon...  CHOL>  Stable on the Simva40...  DM>  Control is worse, weight up, not on  diet; we reviewed diet/ exercise/ & wt reduction strategies; REC to increase Lantus, continue Metformin, Amaryl, Januvia.  OBESITY>  Reviewed diet + exercise needed...  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  Autoimmune hepatitis>  Also managed by DrBrodie, off the Immuran at present...  RLS>  Notes from DrDohmeier reviewed> on Sinemet + Requip...  Dysthymia>  followed by Psyche on mult meds...  Anemia>  Hg down to 9.5 7 Fe=35; we will arrange for outpt infusion of 1000mg  Fe & f/u labs after that.

## 2011-06-12 NOTE — Patient Instructions (Signed)
Today we updated your med list in EPIC the best we could>     Remember to bring all your meds to your doctor visits so we all can know what you are taking...  Today we did your follow up PFT & fasting blood work...    Please call the PHONE TREE in a few days for your results...    Dial [id number] & when prompted enter your patient number followed by the # symbol...    Your patient number is:  [id number]#  Let's get on track w/ the DIET & EXERCISE program>    This is the BEST way to improve your breathing & stamina...  You are also due for a check up w/ DrCooper at [facility]...  Call for any questions...  Let's plan a follow up visit in 6 months.Marland KitchenMarland Kitchen

## 2011-06-13 ENCOUNTER — Encounter: Payer: Self-pay | Admitting: Pulmonary Disease

## 2011-06-14 ENCOUNTER — Other Ambulatory Visit: Payer: Self-pay | Admitting: Pulmonary Disease

## 2011-06-14 DIAGNOSIS — D649 Anemia, unspecified: Secondary | ICD-10-CM

## 2011-06-14 DIAGNOSIS — D500 Iron deficiency anemia secondary to blood loss (chronic): Secondary | ICD-10-CM

## 2011-06-14 MED ORDER — INSULIN GLARGINE 100 UNIT/ML ~~LOC~~ SOLN: SUBCUTANEOUS | Status: DC

## 2011-06-17 ENCOUNTER — Other Ambulatory Visit: Payer: Self-pay | Admitting: Pulmonary Disease

## 2011-06-17 DIAGNOSIS — D500 Iron deficiency anemia secondary to blood loss (chronic): Secondary | ICD-10-CM

## 2011-06-17 DIAGNOSIS — D649 Anemia, unspecified: Secondary | ICD-10-CM

## 2011-06-19 ENCOUNTER — Other Ambulatory Visit: Payer: Self-pay | Admitting: Pulmonary Disease

## 2011-06-19 ENCOUNTER — Ambulatory Visit (HOSPITAL_COMMUNITY): Payer: Medicare Other | Attending: Pulmonary Disease

## 2011-06-19 DIAGNOSIS — D500 Iron deficiency anemia secondary to blood loss (chronic): Secondary | ICD-10-CM | POA: Insufficient documentation

## 2011-06-27 ENCOUNTER — Telehealth: Payer: Self-pay | Admitting: Pulmonary Disease

## 2011-06-27 ENCOUNTER — Ambulatory Visit: Payer: Medicare Other

## 2011-06-27 NOTE — Telephone Encounter (Signed)
Samples left °

## 2011-06-27 NOTE — Telephone Encounter (Signed)
Samples of both meds up front for pick up and pt aware.

## 2011-07-10 ENCOUNTER — Other Ambulatory Visit: Payer: Self-pay | Admitting: Pulmonary Disease

## 2011-07-13 ENCOUNTER — Other Ambulatory Visit: Payer: Self-pay | Admitting: Internal Medicine

## 2011-07-29 ENCOUNTER — Telehealth: Payer: Self-pay | Admitting: Pulmonary Disease

## 2011-07-29 NOTE — Telephone Encounter (Signed)
LMTCB

## 2011-07-29 NOTE — Telephone Encounter (Signed)
LM with family member for pt to call back.  Per results of lab labs drawn 06/12/11 SN stated..."FLP ok on Simva40> contin same, better diet, get wt down!DM control poor w/ Bs245 & A1c8.1> rec incr Lantus to 20u daily & continue Metform/ Glimep/ Januvia regularly.Anemic w/ Hg9.5 & Fe35> needs to be set up for 1000mg  IV Fe as outpt & sched f/u CBC/ Fe panel 23mo after infusion. other labs ok..."  Looks like Sn wanted a repeat Iron Panel 1 month after infusion.. Is this the lab she is talking about because if so, it's already been ordered by Leigh as of 06/17/11.   Will wait for pt to call back.

## 2011-07-29 NOTE — Telephone Encounter (Signed)
Patient calling back. She states she wants to be called back in the morning not this afternoon.

## 2011-07-30 ENCOUNTER — Telehealth: Payer: Self-pay | Admitting: Pulmonary Disease

## 2011-07-30 ENCOUNTER — Other Ambulatory Visit (INDEPENDENT_AMBULATORY_CARE_PROVIDER_SITE_OTHER): Payer: Medicare Other

## 2011-07-30 ENCOUNTER — Ambulatory Visit (INDEPENDENT_AMBULATORY_CARE_PROVIDER_SITE_OTHER): Payer: Medicare Other

## 2011-07-30 DIAGNOSIS — D500 Iron deficiency anemia secondary to blood loss (chronic): Secondary | ICD-10-CM

## 2011-07-30 DIAGNOSIS — D649 Anemia, unspecified: Secondary | ICD-10-CM

## 2011-07-30 DIAGNOSIS — Z23 Encounter for immunization: Secondary | ICD-10-CM

## 2011-07-30 LAB — CBC WITH DIFFERENTIAL/PLATELET
Basophils Relative: 0.3 % (ref 0.0–3.0)
Eosinophils Relative: 4.4 % (ref 0.0–5.0)
HCT: 33.1 % — ABNORMAL LOW (ref 36.0–46.0)
Hemoglobin: 11.4 g/dL — ABNORMAL LOW (ref 12.0–15.0)
Lymphocytes Relative: 10.5 % — ABNORMAL LOW (ref 12.0–46.0)
Lymphs Abs: 0.4 10*3/uL — ABNORMAL LOW (ref 0.7–4.0)
Monocytes Relative: 8.8 % (ref 3.0–12.0)
Neutro Abs: 2.7 10*3/uL (ref 1.4–7.7)
RBC: 3.35 Mil/uL — ABNORMAL LOW (ref 3.87–5.11)
RDW: 16.7 % — ABNORMAL HIGH (ref 11.5–14.6)

## 2011-07-30 LAB — IBC PANEL: Saturation Ratios: 26.4 % (ref 20.0–50.0)

## 2011-07-30 NOTE — Telephone Encounter (Signed)
Pt informed that lab for iron panel is already ordered in computer and she can come to lab anytime.

## 2011-07-30 NOTE — Telephone Encounter (Signed)
Returning call can be reached at [phone].[name]

## 2011-07-30 NOTE — Telephone Encounter (Signed)
Called and spoke with pt and verified that order for iron panel is already in computer.

## 2011-07-30 NOTE — Telephone Encounter (Signed)
Samples left at front of Advair and Januvia - Pt aware

## 2011-07-31 ENCOUNTER — Other Ambulatory Visit: Payer: Self-pay | Admitting: Pulmonary Disease

## 2011-08-01 ENCOUNTER — Other Ambulatory Visit: Payer: Self-pay | Admitting: Pulmonary Disease

## 2011-08-01 DIAGNOSIS — D649 Anemia, unspecified: Secondary | ICD-10-CM

## 2011-08-13 ENCOUNTER — Telehealth: Payer: Self-pay | Admitting: Pulmonary Disease

## 2011-08-13 MED ORDER — LEVOFLOXACIN 250 MG PO TABS: ORAL_TABLET | ORAL | Status: DC

## 2011-08-13 NOTE — Telephone Encounter (Signed)
Per SN---ok for pt to have levaquin 250mg   #10  1 po daily until gone.

## 2011-08-13 NOTE — Telephone Encounter (Signed)
I spoke pt and she c/o PND, nasal congestion, sinus headache, sinus pressure, runny nose, cough w/ green phlem x Sunday. Pt denies any fever, nausea, vomiting. Pt would like something called in for this. Please advise Dr. Kriste Basque, thanks  Allergies  Allergen Reactions  . Aspirin     Causes nosebleeds  . Penicillins     REACTION: itching  . Theophylline     REACTION: nausea and vomiting

## 2011-08-13 NOTE — Telephone Encounter (Signed)
Pt is fine with SN recs and advised me to call rx into [facility]. rx has been sent

## 2011-08-28 ENCOUNTER — Telehealth: Payer: Self-pay | Admitting: Pulmonary Disease

## 2011-08-28 NOTE — Telephone Encounter (Signed)
Samples of each at front desk and patient is aware.

## 2011-09-05 ENCOUNTER — Other Ambulatory Visit: Payer: Self-pay | Admitting: Pulmonary Disease

## 2011-09-08 ENCOUNTER — Other Ambulatory Visit: Payer: Self-pay | Admitting: Pulmonary Disease

## 2011-09-23 ENCOUNTER — Other Ambulatory Visit: Payer: Self-pay | Admitting: Pulmonary Disease

## 2011-10-02 ENCOUNTER — Telehealth: Payer: Self-pay | Admitting: Pulmonary Disease

## 2011-10-02 NOTE — Telephone Encounter (Signed)
I have left a sample of advair and januvia upfront for pick up. Pt is aware

## 2011-10-06 ENCOUNTER — Other Ambulatory Visit: Payer: Self-pay | Admitting: Pulmonary Disease

## 2011-10-25 ENCOUNTER — Ambulatory Visit (INDEPENDENT_AMBULATORY_CARE_PROVIDER_SITE_OTHER): Payer: Medicare Other | Admitting: Pulmonary Disease

## 2011-10-25 ENCOUNTER — Encounter: Payer: Self-pay | Admitting: Pulmonary Disease

## 2011-10-25 ENCOUNTER — Other Ambulatory Visit (INDEPENDENT_AMBULATORY_CARE_PROVIDER_SITE_OTHER): Payer: Medicare Other

## 2011-10-25 VITALS — BP 120/72 | HR 68 | Temp 97.0°F | Ht 64.0 in | Wt 214.8 lb

## 2011-10-25 DIAGNOSIS — E119 Type 2 diabetes mellitus without complications: Secondary | ICD-10-CM

## 2011-10-25 DIAGNOSIS — J449 Chronic obstructive pulmonary disease, unspecified: Secondary | ICD-10-CM

## 2011-10-25 DIAGNOSIS — M1990 Unspecified osteoarthritis, unspecified site: Secondary | ICD-10-CM

## 2011-10-25 DIAGNOSIS — E669 Obesity, unspecified: Secondary | ICD-10-CM

## 2011-10-25 DIAGNOSIS — K754 Autoimmune hepatitis: Secondary | ICD-10-CM

## 2011-10-25 DIAGNOSIS — D649 Anemia, unspecified: Secondary | ICD-10-CM

## 2011-10-25 DIAGNOSIS — I10 Essential (primary) hypertension: Secondary | ICD-10-CM

## 2011-10-25 DIAGNOSIS — E7800 Pure hypercholesterolemia, unspecified: Secondary | ICD-10-CM

## 2011-10-25 DIAGNOSIS — K5520 Angiodysplasia of colon without hemorrhage: Secondary | ICD-10-CM

## 2011-10-25 DIAGNOSIS — I359 Nonrheumatic aortic valve disorder, unspecified: Secondary | ICD-10-CM

## 2011-10-25 DIAGNOSIS — IMO0001 Reserved for inherently not codable concepts without codable children: Secondary | ICD-10-CM | POA: Insufficient documentation

## 2011-10-25 DIAGNOSIS — I2510 Atherosclerotic heart disease of native coronary artery without angina pectoris: Secondary | ICD-10-CM

## 2011-10-25 LAB — CBC WITH DIFFERENTIAL/PLATELET
Basophils Absolute: 0 10*3/uL (ref 0.0–0.1)
Hemoglobin: 11.2 g/dL — ABNORMAL LOW (ref 12.0–15.0)
Lymphocytes Relative: 10.2 % — ABNORMAL LOW (ref 12.0–46.0)
Monocytes Relative: 12.5 % — ABNORMAL HIGH (ref 3.0–12.0)
Neutro Abs: 3.1 10*3/uL (ref 1.4–7.7)
RBC: 3.35 Mil/uL — ABNORMAL LOW (ref 3.87–5.11)
RDW: 15 % — ABNORMAL HIGH (ref 11.5–14.6)

## 2011-10-25 LAB — IBC PANEL: Saturation Ratios: 17.1 % — ABNORMAL LOW (ref 20.0–50.0)

## 2011-10-25 LAB — BASIC METABOLIC PANEL
Calcium: 9.3 mg/dL (ref 8.4–10.5)
GFR: 65.29 mL/min (ref >60.00)
Glucose, Bld: 276 mg/dL — ABNORMAL HIGH (ref 70–99)
Sodium: 139 mEq/L (ref 135–145)

## 2011-10-25 LAB — HEMOGLOBIN A1C: Hgb A1c MFr Bld: 6.8 % — ABNORMAL HIGH (ref 4.6–6.5)

## 2011-10-25 NOTE — Progress Notes (Signed)
Subjective:    Patient ID: [name], female    DOB: 22-Sep-1940, 71 y.o.   MRN: [id number]  HPI 71 y/o WF here for a follow up visit... she has mult med problems as noted below>   ~  October 25, 2011:  18mo ROV & [name] is w/o complaints today; SEE 06/12/11 OV for prev note string...  We rechecked labs today (see below), no changes made...     COPD> on Home O2, Advair250, NEBS, Mucinex; remote smoker quit yrs ago; known obstructive & restrictive lung dis; she has lost 10# in the interval & feels as though her breathing is sl improved as a result; asked to continue the same meds for now.     HBP> on Aten50, Lasix40Bid, K20; BP= 120/72 & she has mult somatic complaints but no specific CP, palpit, ch in SOB or edema; continue low sodium 7 wt reduction.     CAD & AS/AI> known non-obstructive CAD; hx atypCP/ CWP; no angina & she remains way too sedentary; discussed risk factor reduction strategy & she has f/u Cards DrCooper soon.    Chol> on Simva40; not really on diet & we discussed this; last FLP 10/12 showed TChol 110, TG 116, HDL 34, LDL 53; continue same, needs exercise!    DM> on Lantus20, Metform1000Bid, Glimep4, Januvia100; needs much better diet & exercise program! Not checking BS at home; BS=276, A1c=6.8 (improved from prev) & Pharm indicates that she is filling pretty regularly...    Obesity> way too sedentary, ?on diet & she has finally lost a little wt (down 10# to 215# today); we discussed diet, exercise, wt reduction strategies...    {GERD, Angiodysplasia w/ chronic GI blood loss...    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 We will  continue to check labs every 114mo & ROV in 18mo...    DJD/ FM/ ?osteopenia/ VitD defic> she has mult somatic complaints & takes Tylenol prn; on VitD 50K weekly...    RLS> on Requip4Qhs & Sinemet25/100Bid from DrDohmeier w/ fair control of symptoms...    Anxiety/ Depression/ Psyche> on RX per Psychiatry DrHejazi (we don't have notes from him) & she does not know her meds, didn't bring med bottles or med list!  As best we can tell she is on Nuvigil, Pristiq, Wellbutrin, Risperdal, Seroquel, Xanax> all from Psychiatry & she is reminded to bring all bottles to every visit...  LABS 3/13:  Chems- ok x BS=276 A1c=6.8;  CBC- Hg=11.2 Fe=71 WBC=4.2K, Plat=75K...          Problem List:  COPD (ICD-496) & BRONCHITIS, RECURRENT (ICD-491.9) - Ex-smoker w/ underlying COPD on home O2, ADVAIR 250Bid, ALBUTEROL (via nebs or MDI for Prn use), & MUCINEX 1-2Bid... she has combined obstructive AND restrictive disease (based on her obesity) w/ diet (weight reduction) + exercise strongly recommended to the pt...  ~  CTChest 5/06 = neg. ~  baseline CXR w/ bilat LL scarring, NAD.... last CXR 3/09 = unchanged, NAD. ~  PFTs 3/09 showed FVC= 2.47 (83%), FEV1= 1.65 (69%), %1sec= 67, mid-flows= 28%pred. ~  CXR 4/12 showed bilat scarring, NAD.Marland KitchenMarland Kitchen ~  PFT 10/12 showed FVC= 2.28 (77%), FEV1= 1.46 (64%), %1sec= 64, mid-flows= 31% pred.  HYPERTENSION (ICD-401.9) - on ATENOLOL 50mg /d,  LASIX 40mg Bid & K20/d...  ~  10/11:  BP=116/78 and she has mult somatic complaints but all are diminished compared to prev visits... denies bad HA's, CP, palpit, syncope, edema, etc... ~  4/12:  BP= 140/60 and she persists w/ mult somatic complaints... ~  10/12:  BP= 122/74 & she has mult somatic complaints; needs better low sodium, calorie restricted diet & wt reduction... ~  3/13:  BP= 120/72 & she is unchanged symptomatically...  CAD (ICD-414.00) - Hosp [facility] 4/09 w/ CP... cath showed non-obstructive CAD... no CP, palpit, etc (but she is way  too sedentary). ~  cath 4/09 by DrCooper showed normLMAIN, 20% midLAD, norm CIRC, 20-30% proxRCA, EF= 50-55%... ~  EKG 1/11 showed SBrady, rate54, ?septal infarct, NSSTTWA, NAD...  PALPITATIONS (ICD-785.1) - eval by DrKlein w/ 7 beats WT on monitor... he changed Lisinopril to BBlocker (currently ATENOLOL 50mg /d) and improved... ~  she saw DrCopper for Cards f/u 1/11- palpit well controlled on Atenolol; hx non-obstructive CAD & good LVF; no change in Rx.  AORTIC STENOSIS (ICD-424.1) - she has mild Ao Valve disease w/ AS/ AI... followed by DrCooper. ~  2DEcho 11/06 showed mild ca++ AoV w/ mild AS, norm LVF w/ EF=55-65% ~  repeat 2DEcho 1/09 showed similar mild ca++ AoV w/ mild AS, mild AI, no regional wall motion abn, norm LVf w/ EF= 60%... NOTE: est PAsys= 35... ~  repeat 2DEcho 1/10 showed norm LVF w/ EF= 55-60%, no regional wall motion abn, mild DD, mod calcif AoV & mild reduced leaflet excursion & mild AI...  HYPERCHOLESTEROLEMIA (ICD-272.0) - on SIMVASTATIN 40mg /d... ~  FLP 6/09 showed TChol 107, TG 69, HDL 35, LDL 59 ~  FLP 3/10 showed TChol 128, TG 125, HDL 33, LDL 70 ~  FLP 12/10 showed TChol 98, TG 64, HDL 37, LDL 48... continue same. ~  FLP 10/11 showed TChol 124, TG 105, HDL 37, LDL 66 ~  FLP 4/12 showed TChol 122, TG 95, HDL 37, LDL 66 ~  FLP 10/12 on Simva40 showed TChol 110, TG 116, HDL 34, LDL 53  DM (ICD-250.00) - hx irreg control, not really on diet... on LANTUS 20u daily,  METFORMIN 500mg -2Bid,  GLIMEPIRIDE 4mg /d,  JANUVIA 100mg /d...  ~  labs 10/08 BS=198, HgA1C=7.2 ~  labs 1/09 showed BS=119, HgA1c=6.0 ~  labs 6/09 showed BS= 248, HgA1c= 7.0..[name] discussed need for starting insulin if she can't get on track and get wt down... ~  labs 9/09 showed BS= 236, HgA1c= 7.6.Marland KitchenMarland Kitchen rec- start Lantus 5u/d... grad increased to 10u. ~  labs 11/09 showed BS= 166, HgA1c= 6.0.Marland KitchenMarland Kitchen rec- great! keep same. ~  labs 3/10 showed BS= 169, A1c= 6.8 ~  labs 12/10 showed BS= 149, A1c= 5.8, Umicroalb=  neg. ~  labs 10/11 showed BS= 215, A1c= 7.1.[name]. rec> better diet, get wt down. ~  Labs 4/12 showed BS= 172, A1c=5.8 ~  Labs 10/12 showed BS= 245, A1c= 8.1.Marland KitchenMarland Kitchen rec to take meds every day; incr Lantus dose 10==> 20u/d... ~  Labs 3/13 on Lantus20 + showed BS= 276, A1c= 6.8 (improved)...  MORBID OBESITY (ICD-278.01) - not really on diet or exercise program... ~  weight 6/09 = 223# ~  weight 11/09 = 228# ~  weight 3/10 = 225# ~  weight 6/10 = 217# ~  weight 9/10 = 213# ~  weight 12/10 = 224# ~  weight 2/11 =  216# ~  weight 10/11 = 230# ~  Weight 4/12 = 222# ~  Weight 10/12 = 225# ~  Weight 3/13 = 215#...  great job, keep it up...  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  ~  EGD  2/06 showed angiodysplasia & mult gastric pseudopolyps, watermelon stomach improved from 2003. ~  recent f/u EGD 9/09 showed chr gastritis, no watermelon stomach & improved from prev... ~  recurrent anemia 12/10 w/ f/u EGD 2/11 showing gastric antral avm's- s/p argon laser ablation, no varicies etc... 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  DIVERTICULOSIS OF COLON (ICD-562.10) & POLYP, COLON (ICD-211.3) -  ~  last colonoscopy 10/08 w/ adenomatous polyp removed, and angiodysplasia without hemorrhage ~  CT Abd 10/08 showed Cirrhosis, borderline splenomeg, sl pancreatic atrophy & duct dil, sm umbil hernia, DJD sp, NAD...  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 ~  LFTs 11/09 = WNL ~  LFTs 3/10 = WNL ~  LFTs 12/10 = WNL ~  LFTs 10/11 showed  SGOT=39 (0-37), SGPT=35 (0-35) ~  LFTs 4/12 showed SGOT=39, SGPT=16 ~  LFTs 10/12 = WNL  Hx of CYSTITIS, RECURRENT (ICD-595.9) - hx mult UTI's w/ eval by DrGrapey for Urology... ~  10/11:  presents w/ dysuria & UTI Rx w/ Cipro... ~  12/11:  She had f/u DrGrapey...  OSTEOARTHRITIS (ICD-715.90)  FIBROMYALGIA (ICD-729.1) - she persists w/ mult somatic complaints...  ? of OSTEOPOROSIS (ICD-733.00) - she had normal BMD in 2001 at [place]..  RESTLESS LEGS SYNDROME (ICD-333.94) - she prev weaned off the Requip... ~  9/10: now c/o recurrent restless leg symptoms... restart REQUIP 2mg  & titrate up. ~  12/10: persistant symptoms on 2mg Qhs... rec to incr to 4mg ... ~  2/11: she reports resolution of RLS on 4mg  Requip Qhs, resting well now. ~  12/11:  Recurrent RLS symptoms despite the Requip & seen by DrDohmeier w/ addition of Sinemet 25/100 Bid... ~  2012:  Stable on Requip4mg  & Sinemet 25/100 Bid...  DYSTHYMIA (ICD-300.4) - Hx depression w/ psychotic features and hosp 2/09 at [facility] w/ delusions of pedunculosis... she was prev treated w/ RISPERDAL, Celexa, XANAX & Ambien by Dr. Bartholomew Crews & DrSuttenfield for counselling... Adm to [facility] in May09 by DrBarbSmith w/ depression and meds adjusted (see DC summary)... now followed by [facility] and [name] at [facility]- she was re-admitted 10/09 and meds changed to RISPERDAL, PRISTIQUE, BUPROPION, NUVIGIL, & XANAX...  husb indicates that she is much better at present & continues outpt Rx... ~  MRI/ MRA Brain 2/08 showed sm vessel dis & atherosclerotic changes in post circ... ~  9/10: pt indicates that she is off the Trazodone... DrHejazi added Wellbutrin ?for her RLS? ~  2011:  pt continues regular f/u w/ Psychiatry on numerous meds w/ freq med adjustments; she is requested to get notes from [facility] to our attention & bring all med bottles to her OVs for review... ~  2012:  We have not received any notes from  Psyche; pt has not complied w/ our request for med bottles to be brought to each  OV.[name].  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 ~  labs 04/18/08 by DrBrodie showed Hg= 10.4, Fe= 52... she ordered 1,200mg  IV Iron- given 04/26/08. ~  labs 05/19/08 showed Hg= 11.3, Fe= 117... ~  labs 11/09 showed Hg= 12.0.Marland KitchenMarland Kitchen ~  labs 3/10 showed Hg= 12.3, Fe= 79 ~  labs 6/10 showed Hg= 11.5, Fe= 73 ~  labs 12/10 showed Hg= 7.2, Fe= 28 (6%sat)... given [facility] & IV Fe per DrBrodie in Jan2012. ~  labs 2/11 showed Hg= 11.8, Fe= 95... pt indicates that DrBrodie will be following labs going forward... ~  labs 6/11 showed Hg= 11.2 ~  labs 9/11 showed Hg= 11.1, Fe= 106 ~  Labs 4/12 showed Hg=11.0, Fe=118 ~  Labs 10/12 showed Hg=9.5, Fe=35 (7%sat)... We will arrange for outpt infusion 1000mg  Fe... ~  Labs 12/12 showed Hg=11.4, Fe=92 (26%sat) ~  Labs 3/13 showed Hg=11.2, Fe=71 (17%sat)   Past Surgical History  Procedure Date  . Hemorroidectomy   . Vaginal hysterectomy   . Appendectomy     Outpatient Encounter Prescriptions as of 10/25/2011  Medication Sig Dispense Refill  . albuterol (PROAIR HFA) 108 (90 BASE) MCG/ACT inhaler Inhale 2 puffs into the lungs every 6 (six) hours as needed.        . ALPRAZolam (XANAX) 1 MG tablet Take 1 mg by mouth at bedtime as needed.        . Armodafinil (NUVIGIL) 150 MG tablet Take 150 mg by mouth daily.        [name] atenolol (TENORMIN) 50 MG tablet TAKE 1 TABLET BY MOUTH EVERY DAY  30 tablet  10  . b complex vitamins tablet Take 1 tablet by mouth daily.        [name] buPROPion (ZYBAN) 150 MG 12 hr tablet Take 150 mg by mouth daily.        . carbidopa-levodopa (SINEMET CR) 25-100 MG per tablet 1 at lunch and 1 at dinner       . clotrimazole (LOTRIMIN) 1 % cream Apply topically 2 (two) times daily.        [name] desvenlafaxine (PRISTIQ) 50 MG 24 hr tablet  Take 50 mg by mouth daily.        . Fluticasone-Salmeterol (ADVAIR DISKUS) 250-50 MCG/DOSE AEPB Inhale 1 puff into the lungs every 12 (twelve) hours.        . furosemide (LASIX) 40 MG tablet TAKE 1 TABLET TWICE DAILY  60 tablet  5  . glimepiride (AMARYL) 4 MG tablet TAKE 1 TABLET EVERY MORNING  90 tablet  4  . glucose blood test strip 1 each by Other route as needed. Use as instructed       . guaiFENesin (MUCINEX) 600 MG 12 hr tablet Take 1,200 mg by mouth 2 (two) times daily.        . insulin glargine (LANTUS SOLOSTAR) 100 UNIT/ML injection 20 units daily  10 mL  6  . Insulin Pen Needle (B-D ULTRAFINE III SHORT PEN) 31G X 8 MM MISC by Does not apply route.        [name] JANUVIA 100 MG tablet TAKE 1 TABLET EVERY DAY AS DIRECTED  30 tablet  11  . KLOR-CON M20 20 MEQ tablet TAKE 1 TABLET TWICE DAILY  60 tablet  4  . metFORMIN (GLUCOPHAGE) 500 MG tablet TAKE 2 TABLETS BY MOUTH TWICE A DAY  120 tablet  4  .  Multiple Vitamins-Minerals (CENTRUM SILVER PO) Take 1 tablet by mouth daily.        [name] omeprazole (PRILOSEC) 20 MG capsule TAKE 1 CAPSULE BY MOUTH 2 TIMES A DAY  60 capsule  0  . promethazine (PHENERGAN) 25 MG tablet Take 25 mg by mouth every 6 (six) hours as needed.        [name] QUEtiapine (SEROQUEL) 100 MG tablet 2 at bedtime       . risperiDONE (RISPERDAL) 3 MG tablet Take by mouth. Take 1/2 at bedtime       . simvastatin (ZOCOR) 40 MG tablet TAKE 1 TABLET BY MOUTH AT BEDTIME FOR CHOLESTEROL  30 tablet  6  . Vitamin D, Ergocalciferol, (DRISDOL) 50000 UNITS CAPS TAKE 1 CAPSULE WEEKLY  4 capsule  5  . DISCONTD: levofloxacin (LEVAQUIN) 250 MG tablet Once a day until gone  10 tablet  0  . DISCONTD: rOPINIRole (REQUIP) 4 MG tablet Take 4 mg by mouth at bedtime.          Allergies  Allergen Reactions  . Aspirin     Causes nosebleeds  . Penicillins     REACTION: itching  . Theophylline     REACTION: nausea and vomiting    Current Medications, Allergies, Past Medical History, Past Surgical History,  Family History, and Social History were reviewed in [employer] record.    Review of Systems         See HPI - all other systems neg except as noted...      The patient complains of weight gain, dyspnea on exertion, muscle weakness, difficulty walking, and depression.  The patient denies anorexia, fever, weight loss, vision loss, decreased hearing, hoarseness, chest pain, syncope, peripheral edema, prolonged cough, headaches, hemoptysis, abdominal pain, melena, hematochezia, severe indigestion/heartburn, hematuria, incontinence, suspicious skin lesions, transient blindness, unusual weight change, abnormal bleeding, enlarged lymph nodes, and angioedema.     Objective:   Physical Exam     WD, Obese, 71 y/o WF in NAD... she is chr ill appearing... GENERAL:  Alert & oriented x 3... HEENT:  [place]/AT, sl pale conjuct, EOM-wnl, PERRLA, EACs-clear, TMs-wnl, NOSE-clear, THROAT-clear w/ dry MMs... NECK:  Supple w/ fairROM; no JVD; normal carotid impulses w/o bruits; no thyromegaly or nodules palpated; no lymphadenopathy. CHEST:  Clear to P & A; without wheezes/ rales/ or rhonchi heard... HEART:  Regular Rhythm;  gr 1/6 SEM without rubs or gallops... ABDOMEN:  Obese, soft & nontender w/ panniculus; normal bowel sounds; no organomegaly or masses detected. EXT: without deformities, mod arthritic changes; no varicose veins/ +venous insuffic/ tr edema. NEURO:  CN's intact;  no focal neuro deficits... DERM:  No lesions noted; no rash, pale complexion.  RADIOLOGY DATA:  Reviewed in the EPIC EMR & discussed w/ the patient...  LABORATORY DATA:  Reviewed in the EPIC EMR & discussed w/ the patient...   Assessment & Plan:   COPD>  Combined obstructive & restrictive lung dis; PFT showed sl deterioration in FEV1; multifactorial dyspnea including stress; REC> continue Advair, NEBS, O2, Mucinex, etc; get on diet/ exercise/ get weight down; continue Psyche f/u for help w/ anxiety...  HBP>   Controlled on low dose BBlocker, diuretic> continue same & work on weight reduction...  CAD>  Known nonobstructive dis, way too sedentary> we discussed diet + exercise...  AS>  Stable w/o angina, syncope, etc; she has f/u DrCooper soon...  CHOL>  Stable on the Simva40...  DM>  Control is improved w/ A1c down to 6.8, weight  down 10#, ?on diet; we reviewed diet/ exercise/ & wt reduction strategies; REC to continue Lantus20, Metformin, Amaryl, Januvia.  OBESITY>  Reviewed diet + exercise needed...  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  Autoimmune hepatitis>  Also managed by DrBrodie, off the Immuran at present...  RLS>  Notes from DrDohmeier reviewed> on Sinemet + Requip...  Dysthymia>  followed by Psyche on mult meds...  Anemia>  Last OV received 1000mg  IV Iron & Hg stable ~11 w/ Fe drifting lower (71 today) & we will continue labs Q8wks.Marland KitchenMarland Kitchen

## 2011-10-25 NOTE — Patient Instructions (Signed)
Today we updated your med list in our EPIC system...    Continue your current medications the same...  Today we did your follow up blood work...    Please call the PHONE TREE in a few days for your results...    Dial [id number] & when prompted enter your patient number followed by the # symbol...    Your patient number is:  [id number]#  Call for any questions...  Let's plana follow up visit in 4-6 months.Marland KitchenMarland Kitchen

## 2011-11-22 ENCOUNTER — Other Ambulatory Visit: Payer: Self-pay

## 2011-11-22 ENCOUNTER — Inpatient Hospital Stay (HOSPITAL_COMMUNITY)
Admission: EM | Admit: 2011-11-22 | Discharge: 2011-11-26 | DRG: 192 | Disposition: A | Discharge status: Home / Self Care | Payer: Medicare Other | Attending: Critical Care Medicine | Admitting: Critical Care Medicine

## 2011-11-22 ENCOUNTER — Emergency Department (HOSPITAL_COMMUNITY): Payer: Medicare Other

## 2011-11-22 ENCOUNTER — Encounter (HOSPITAL_COMMUNITY): Payer: Self-pay | Admitting: *Deleted

## 2011-11-22 DIAGNOSIS — E669 Obesity, unspecified: Secondary | ICD-10-CM | POA: Diagnosis present

## 2011-11-22 DIAGNOSIS — I10 Essential (primary) hypertension: Secondary | ICD-10-CM | POA: Diagnosis present

## 2011-11-22 DIAGNOSIS — D509 Iron deficiency anemia, unspecified: Secondary | ICD-10-CM | POA: Diagnosis present

## 2011-11-22 DIAGNOSIS — E0865 Diabetes mellitus due to underlying condition with hyperglycemia: Secondary | ICD-10-CM

## 2011-11-22 DIAGNOSIS — J441 Chronic obstructive pulmonary disease with (acute) exacerbation: Secondary | ICD-10-CM

## 2011-11-22 DIAGNOSIS — K5520 Angiodysplasia of colon without hemorrhage: Secondary | ICD-10-CM | POA: Diagnosis present

## 2011-11-22 DIAGNOSIS — D500 Iron deficiency anemia secondary to blood loss (chronic): Secondary | ICD-10-CM

## 2011-11-22 DIAGNOSIS — K754 Autoimmune hepatitis: Secondary | ICD-10-CM | POA: Diagnosis present

## 2011-11-22 DIAGNOSIS — J449 Chronic obstructive pulmonary disease, unspecified: Secondary | ICD-10-CM

## 2011-11-22 DIAGNOSIS — E119 Type 2 diabetes mellitus without complications: Secondary | ICD-10-CM | POA: Diagnosis present

## 2011-11-22 DIAGNOSIS — I2510 Atherosclerotic heart disease of native coronary artery without angina pectoris: Secondary | ICD-10-CM | POA: Diagnosis present

## 2011-11-22 DIAGNOSIS — K7460 Unspecified cirrhosis of liver: Secondary | ICD-10-CM | POA: Diagnosis present

## 2011-11-22 HISTORY — DX: Personal history of other diseases of the digestive system: Z87.19

## 2011-11-22 HISTORY — DX: Type 2 diabetes mellitus without complications: E11.9

## 2011-11-22 HISTORY — DX: Carpal tunnel syndrome, bilateral upper limbs: G56.03

## 2011-11-22 HISTORY — DX: Other specified forms of tremor: G25.2

## 2011-11-22 LAB — POCT I-STAT, CHEM 8
Glucose, Bld: 319 mg/dL — ABNORMAL HIGH (ref 70–99)
HCT: 31 % — ABNORMAL LOW (ref 36.0–46.0)
Hemoglobin: 10.5 g/dL — ABNORMAL LOW (ref 12.0–15.0)
Potassium: 3.8 mEq/L (ref 3.5–5.1)
Sodium: 140 mEq/L (ref 135–145)

## 2011-11-22 LAB — GLUCOSE, CAPILLARY
Glucose-Capillary: 278 mg/dL — ABNORMAL HIGH (ref 70–99)
Glucose-Capillary: 369 mg/dL — ABNORMAL HIGH (ref 70–99)

## 2011-11-22 LAB — POCT I-STAT TROPONIN I: Troponin i, poc: 0.1 ng/mL (ref 0.00–0.08)

## 2011-11-22 LAB — CARDIAC PANEL(CRET KIN+CKTOT+MB+TROPI): Relative Index: 3 — ABNORMAL HIGH (ref 0.0–2.5)

## 2011-11-22 MED ORDER — PREDNISONE 20 MG PO TABS
40.0000 mg | ORAL_TABLET | Freq: Every day | ORAL | Status: DC
  Administered 2011-11-23: 40 mg via ORAL
  Filled 2011-11-22 (×3): qty 2

## 2011-11-22 MED ORDER — FLUTICASONE-SALMETEROL 250-50 MCG/DOSE IN AEPB
1.0000 | INHALATION_SPRAY | Freq: Two times a day (BID) | RESPIRATORY_TRACT | Status: DC
  Administered 2011-11-23 – 2011-11-26 (×6): 1 via RESPIRATORY_TRACT
  Filled 2011-11-22: qty 14

## 2011-11-22 MED ORDER — DEXTROSE 5 % IV SOLN
500.0000 mg | Freq: Once | INTRAVENOUS | Status: AC
  Administered 2011-11-23: 500 mg via INTRAVENOUS
  Filled 2011-11-22: qty 500

## 2011-11-22 MED ORDER — B COMPLEX PO TABS: 1.0000 | ORAL_TABLET | Freq: Every day | ORAL | Status: DC

## 2011-11-22 MED ORDER — CARBIDOPA-LEVODOPA CR 25-100 MG PO TBCR
1.0000 | EXTENDED_RELEASE_TABLET | Freq: Two times a day (BID) | ORAL | Status: DC
  Administered 2011-11-23 – 2011-11-25 (×6): 1 via ORAL
  Filled 2011-11-22 (×8): qty 1

## 2011-11-22 MED ORDER — ATENOLOL 50 MG PO TABS
50.0000 mg | ORAL_TABLET | Freq: Every day | ORAL | Status: DC
  Administered 2011-11-23 – 2011-11-26 (×4): 50 mg via ORAL
  Filled 2011-11-22 (×4): qty 1

## 2011-11-22 MED ORDER — ALBUTEROL SULFATE (5 MG/ML) 0.5% IN NEBU
5.0000 mg | INHALATION_SOLUTION | Freq: Once | RESPIRATORY_TRACT | Status: AC
  Administered 2011-11-22: 5 mg via RESPIRATORY_TRACT
  Filled 2011-11-22: qty 1

## 2011-11-22 MED ORDER — PANTOPRAZOLE SODIUM 40 MG PO TBEC
40.0000 mg | DELAYED_RELEASE_TABLET | Freq: Every day | ORAL | Status: DC
  Administered 2011-11-23 – 2011-11-25 (×4): 40 mg via ORAL
  Filled 2011-11-22 (×2): qty 1

## 2011-11-22 MED ORDER — ASPIRIN 81 MG PO CHEW
324.0000 mg | CHEWABLE_TABLET | Freq: Once | ORAL | Status: DC
  Filled 2011-11-22: qty 4

## 2011-11-22 MED ORDER — SIMVASTATIN 40 MG PO TABS
40.0000 mg | ORAL_TABLET | Freq: Every evening | ORAL | Status: DC
  Administered 2011-11-23 – 2011-11-25 (×4): 40 mg via ORAL
  Filled 2011-11-22 (×5): qty 1

## 2011-11-22 MED ORDER — HEPARIN SODIUM (PORCINE) 5000 UNIT/ML IJ SOLN
5000.0000 [IU] | Freq: Three times a day (TID) | INTRAMUSCULAR | Status: DC
  Filled 2011-11-22 (×5): qty 1

## 2011-11-22 MED ORDER — VENLAFAXINE HCL ER 75 MG PO CP24
75.0000 mg | ORAL_CAPSULE | Freq: Every day | ORAL | Status: DC
  Administered 2011-11-23 – 2011-11-26 (×4): 75 mg via ORAL
  Filled 2011-11-22 (×5): qty 1

## 2011-11-22 MED ORDER — POTASSIUM CHLORIDE CRYS ER 20 MEQ PO TBCR
20.0000 meq | EXTENDED_RELEASE_TABLET | Freq: Every day | ORAL | Status: DC
  Administered 2011-11-23 – 2011-11-26 (×4): 20 meq via ORAL
  Filled 2011-11-22 (×5): qty 1

## 2011-11-22 MED ORDER — DEXTROSE 5 % IV SOLN
500.0000 mg | INTRAVENOUS | Status: DC
  Filled 2011-11-22: qty 500

## 2011-11-22 MED ORDER — ALBUTEROL SULFATE (5 MG/ML) 0.5% IN NEBU
2.5000 mg | INHALATION_SOLUTION | RESPIRATORY_TRACT | Status: DC | PRN
  Filled 2011-11-22: qty 0.5

## 2011-11-22 MED ORDER — MODAFINIL 200 MG PO TABS
200.0000 mg | ORAL_TABLET | Freq: Every day | ORAL | Status: DC
  Administered 2011-11-23 – 2011-11-26 (×4): 200 mg via ORAL
  Filled 2011-11-22 (×3): qty 1

## 2011-11-22 MED ORDER — INSULIN GLARGINE 100 UNIT/ML ~~LOC~~ SOLN
20.0000 [IU] | Freq: Every day | SUBCUTANEOUS | Status: DC
  Administered 2011-11-23 – 2011-11-26 (×4): 20 [IU] via SUBCUTANEOUS

## 2011-11-22 MED ORDER — INSULIN ASPART 100 UNIT/ML ~~LOC~~ SOLN
0.0000 [IU] | Freq: Three times a day (TID) | SUBCUTANEOUS | Status: DC
  Administered 2011-11-22 – 2011-11-23 (×2): 15 [IU] via SUBCUTANEOUS
  Administered 2011-11-23: 8 [IU] via SUBCUTANEOUS
  Administered 2011-11-23: 15 [IU] via SUBCUTANEOUS

## 2011-11-22 MED ORDER — ALBUTEROL SULFATE (5 MG/ML) 0.5% IN NEBU
2.5000 mg | INHALATION_SOLUTION | Freq: Four times a day (QID) | RESPIRATORY_TRACT | Status: DC
  Administered 2011-11-23 – 2011-11-24 (×4): 2.5 mg via RESPIRATORY_TRACT
  Filled 2011-11-22 (×5): qty 0.5

## 2011-11-22 MED ORDER — RISPERIDONE 1 MG PO TABS
1.5000 mg | ORAL_TABLET | Freq: Every day | ORAL | Status: DC
  Administered 2011-11-22 – 2011-11-25 (×4): 1.5 mg via ORAL
  Filled 2011-11-22 (×5): qty 1

## 2011-11-22 MED ORDER — FUROSEMIDE 40 MG PO TABS
40.0000 mg | ORAL_TABLET | Freq: Every day | ORAL | Status: DC
  Administered 2011-11-23 – 2011-11-26 (×4): 40 mg via ORAL
  Filled 2011-11-22 (×4): qty 1

## 2011-11-22 MED ORDER — IPRATROPIUM BROMIDE 0.02 % IN SOLN
0.5000 mg | Freq: Once | RESPIRATORY_TRACT | Status: AC
  Administered 2011-11-22: 0.5 mg via RESPIRATORY_TRACT
  Filled 2011-11-22: qty 2.5

## 2011-11-22 MED ORDER — BUPROPION HCL ER (XL) 150 MG PO TB24
150.0000 mg | ORAL_TABLET | Freq: Every day | ORAL | Status: DC
  Administered 2011-11-23 – 2011-11-26 (×4): 150 mg via ORAL
  Filled 2011-11-22 (×4): qty 1

## 2011-11-22 MED ORDER — GUAIFENESIN ER 600 MG PO TB12
1200.0000 mg | ORAL_TABLET | Freq: Two times a day (BID) | ORAL | Status: DC
  Administered 2011-11-23 – 2011-11-26 (×8): 1200 mg via ORAL
  Filled 2011-11-22 (×9): qty 2

## 2011-11-22 MED ORDER — IOHEXOL 350 MG/ML SOLN
100.0000 mL | Freq: Once | INTRAVENOUS | Status: AC | PRN
  Administered 2011-11-22: 100 mL via INTRAVENOUS

## 2011-11-22 MED ORDER — METHYLPREDNISOLONE SODIUM SUCC 125 MG IJ SOLR
125.0000 mg | Freq: Once | INTRAMUSCULAR | Status: AC
  Administered 2011-11-22: 125 mg via INTRAVENOUS
  Filled 2011-11-22: qty 2

## 2011-11-22 MED ORDER — ADULT MULTIVITAMIN W/MINERALS CH
1.0000 | ORAL_TABLET | Freq: Every day | ORAL | Status: DC
  Administered 2011-11-23 – 2011-11-26 (×4): 1 via ORAL
  Filled 2011-11-22 (×4): qty 1

## 2011-11-22 MED ORDER — ALPRAZOLAM 0.5 MG PO TABS
1.0000 mg | ORAL_TABLET | Freq: Every evening | ORAL | Status: DC | PRN
  Administered 2011-11-23 – 2011-11-25 (×3): 1 mg via ORAL
  Filled 2011-11-22 (×3): qty 2

## 2011-11-22 MED ORDER — ARMODAFINIL 150 MG PO TABS: 150.0000 mg | ORAL_TABLET | Freq: Every day | ORAL | Status: DC

## 2011-11-22 MED ORDER — B COMPLEX-C PO TABS
1.0000 | ORAL_TABLET | Freq: Every day | ORAL | Status: DC
  Administered 2011-11-23 – 2011-11-26 (×4): 1 via ORAL
  Filled 2011-11-22 (×4): qty 1

## 2011-11-22 MED ORDER — QUETIAPINE FUMARATE 200 MG PO TABS
200.0000 mg | ORAL_TABLET | Freq: Every day | ORAL | Status: DC
  Administered 2011-11-22 – 2011-11-25 (×4): 200 mg via ORAL
  Filled 2011-11-22 (×5): qty 1

## 2011-11-22 NOTE — ED Notes (Signed)
Cbg [id number] Rn notified [place]

## 2011-11-22 NOTE — ED Notes (Signed)
Attempted to call report x2. RN unavailable at this time.

## 2011-11-22 NOTE — ED Provider Notes (Signed)
History     CSN: [id number]  Arrival date & time 11/22/11  1142   First MD Initiated Contact with Patient 11/22/11 1330      Chief Complaint  Patient presents with  . Shortness of Breath    (Consider location/radiation/quality/duration/timing/severity/associated sxs/prior treatment) Patient is a 71 y.o. female presenting with shortness of breath. The history is provided by the patient.  Shortness of Breath  The current episode started yesterday. Associated symptoms include shortness of breath. Pertinent negatives include no chest pain and no cough.   patient's had shortness of breath that began yesterday and is somewhat worse today. She states was walking to see her husband was in the hospital after having his gallbladder out. He has a history of COPD, but states this does not feel like that. No chest pain. No cough. She has swelling in her left lower leg. She states she is little nausea since. The shortness of breath is worse with exertion. She states her sugars have been running high. No recent travel.  Past Medical History  Diagnosis Date  . Chronic airway obstruction, not elsewhere classified   . Unspecified chronic bronchitis   . Unspecified essential hypertension   . Coronary atherosclerosis of unspecified type of vessel, native or graft   . Palpitations   . Aortic valve disorders   . Pure hypercholesterolemia   . Morbid obesity   . Esophageal reflux   . Angiodysplasia of intestine (without mention of hemorrhage)   . Diverticulosis of colon (without mention of hemorrhage)   . Benign neoplasm of colon   . Autoimmune hepatitis   . Cystitis, unspecified   . Osteoarthrosis, unspecified whether generalized or localized, unspecified site   . Osteoporosis, unspecified   . Restless legs syndrome (RLS)   . Major depressive disorder, recurrent episode, severe, specified as with psychotic behavior   . Iron deficiency anemia secondary to blood loss (chronic)   . Coarse tremors    "just in my arms"  . Carpal tunnel syndrome on both sides   . Dysrhythmia     palpitations  . CHF (congestive heart failure)   . Asthma   . Shortness of breath     "any time really"  . Sleep apnea     "but I don't use the equipment"  . Type II diabetes mellitus   . Blood transfusion   . H/O hiatal hernia   . Hepatic cirrhosis     dx'd 1990's    Past Surgical History  Procedure Date  . Hemorroidectomy   . Appendectomy   . Vaginal hysterectomy     Family History  Problem Relation Age of Onset  . Heart disease Mother   . Breast cancer Sister   . Cervical cancer Mother   . Diabetes Father     History  Substance Use Topics  . Smoking status: Former Smoker -- 1.5 packs/day for 25 years    Types: Cigarettes    Quit date: 08/26/1992  . Smokeless tobacco: Never Used  . Alcohol Use: Yes     11/22/11 "last alcoholic drink was years ago"    OB History    Grav Para Term Preterm Abortions TAB SAB Ect Mult Living                  Review of Systems  Constitutional: Negative for activity change and appetite change.  HENT: Negative for neck stiffness.   Eyes: Negative for pain.  Respiratory: Positive for shortness of breath. Negative for cough and chest  tightness.   Cardiovascular: Positive for leg swelling. Negative for chest pain.  Gastrointestinal: Positive for nausea. Negative for vomiting, abdominal pain and diarrhea.  Genitourinary: Positive for frequency. Negative for flank pain.  Musculoskeletal: Negative for back pain.  Skin: Negative for rash.  Neurological: Negative for weakness, numbness and headaches.  Psychiatric/Behavioral: Negative for behavioral problems.    Allergies  Aspirin; Penicillins; and Theophylline  Home Medications   No current outpatient prescriptions on file.  BP 158/78  Pulse 87  Temp 98 F (36.7 C)  Resp 16  Ht 5\' 4"  (1.626 m)  Wt 211 lb 10.3 oz (96 kg)  BMI 36.33 kg/m2  SpO2 93%  Physical Exam  Nursing note and vitals  reviewed. Constitutional: She is oriented to person, place, and time. She appears well-developed and well-nourished.  HENT:  Head: Normocephalic and atraumatic.  Eyes: EOM are normal. Pupils are equal, round, and reactive to light.  Neck: Normal range of motion. Neck supple.  Cardiovascular: Normal rate, regular rhythm and normal heart sounds.   No murmur heard. Pulmonary/Chest: Effort normal and breath sounds normal. No respiratory distress. She has no wheezes. She has no rales.  Abdominal: Soft. Bowel sounds are normal. She exhibits no distension. There is no tenderness. There is no rebound and no guarding.  Musculoskeletal: Normal range of motion. She exhibits edema.       Bilateral lower extremity pitting edema. Worse on left than right.  Neurological: She is alert and oriented to person, place, and time. No cranial nerve deficit.  Skin: Skin is warm and dry.  Psychiatric: She has a normal mood and affect. Her speech is normal.    ED Course  Procedures (including critical care time)  Labs Reviewed  GLUCOSE, CAPILLARY - Abnormal; Notable for the following:    Glucose-Capillary 278 (*)    All other components within normal limits  POCT I-STAT, CHEM 8 - Abnormal; Notable for the following:    Glucose, Bld 319 (*)    Hemoglobin 10.5 (*)    HCT 31.0 (*)    All other components within normal limits  POCT I-STAT TROPONIN I - Abnormal; Notable for the following:    Troponin i, poc 0.10 (*)    All other components within normal limits  CARDIAC PANEL(CRET KIN+CKTOT+MB+TROPI) - Abnormal; Notable for the following:    CK, MB 4.4 (*)    Relative Index 3.0 (*)    All other components within normal limits  GLUCOSE, CAPILLARY - Abnormal; Notable for the following:    Glucose-Capillary 369 (*)    All other components within normal limits  GLUCOSE, CAPILLARY - Abnormal; Notable for the following:    Glucose-Capillary 363 (*)    All other components within normal limits  TROPONIN I  CBC    COMPREHENSIVE METABOLIC PANEL   Dg Chest 2 View  11/22/2011  *RADIOLOGY REPORT*  Clinical Data: Breath.  Asthma.  COPD.  CHEST - 2 VIEW  Comparison: 12/11/2010.  Findings: Cardiopericardial silhouette is within normal limits and unchanged from prior.  Stable prominence of pulmonary hila.  No airspace disease.  No effusion.  Aortic arch appears within normal limits.  IMPRESSION: No active cardiopulmonary disease or interval change.  Original Report Authenticated By: Andreas Newport, M.D.   Ct Angio Chest W/cm &/or Wo Cm  11/22/2011  *RADIOLOGY REPORT*  Clinical Data: Leg swelling.  Shortness of breath  CT ANGIOGRAPHY CHEST  Technique:  Multidetector CT imaging of the chest using the standard protocol during bolus administration of intravenous  contrast. Multiplanar reconstructed images including MIPs were obtained and reviewed to evaluate the vascular anatomy.  Contrast:  100 ml of omni 300  Comparison: 01/11/2005  Findings:  No axillary or supraclavicular adenopathy noted.  There is no mediastinal or hilar adenopathy identified.  Calcifications in the LAD and left circumflex coronary arteries noted.  Scar versus plate-like atelectasis is noted in both upper lobes.  There is no airspace consolidation identified.  There is mild multilevel spondylosis within the thoracic spine.  There are no worrisome lytic or sclerotic lesions identified.  The pulmonary arteries appear patent.  No evidence for acute pulmonary embolus.  Limited imaging through the upper abdomen shows morphologic features the liver compatible with cirrhosis.  IMPRESSION:  1.  No evidence for acute pulmonary embolus.  No active cardiopulmonary abnormalities. 2.  Cirrhosis of the liver.  Original Report Authenticated By: Rosealee Albee, M.D.     1. COPD (chronic obstructive pulmonary disease)      Date: 11/22/2011  Rate: 68  Rhythm: normal sinus rhythm  QRS Axis: left  Intervals: normal  ST/T Wave abnormalities: normal  Conduction  Disutrbances:none  Narrative Interpretation:   Old EKG Reviewed: unchanged    MDM  Shortness of breath. Has swelling of her left lower extremity. Negative CT angioedema for chest. She has a history of COPD. She is on home oxygen as needed. Upon attempted ambulation her sats dipped into the 80s. She'll be admitted to medicine. She's had no chest pain. Her initial point her troponin was slightly above normal, but her followup troponins were negative. She'll be admitted pulmonology        Juliet Rude. Rubin Payor, MD 11/22/11 2322

## 2011-11-22 NOTE — ED Notes (Signed)
Family at bedside. 

## 2011-11-22 NOTE — ED Notes (Signed)
Pt placed on cardiac monitor, cont. Pulse ox. 02 Bowie @2lt  and cont. bp monitoring.

## 2011-11-22 NOTE — ED Notes (Signed)
Patient denies pain and is resting comfortably.  

## 2011-11-22 NOTE — ED Notes (Signed)
Dr. Radford Pax aware of I-stat troponin level

## 2011-11-22 NOTE — ED Notes (Signed)
Hx of GI bleed

## 2011-11-22 NOTE — ED Notes (Signed)
Pt c/o increase sob w/ambulating, nausea, and weakness x1 day. Pt reports she has not had any sleep since yesterday, pt is here w/husband, pt's reports her husband had gall bladder surgery yesterday and she has been back and forth between here and home since yesterday afternoon. Pt states "I just feel give out." Pt denies active chest pain, abd/back/arm/jaw pain, or diaphoresis.

## 2011-11-22 NOTE — ED Notes (Signed)
Attempted to call report and RN unavailable at this time .

## 2011-11-22 NOTE — H&P (Signed)
Patient name: [name] Medical record number: [id number] Date of birth: 12/31/1940 Age: 71 y.o. Gender: female PCP: Michele Mcalpine, MD, MD  PCCM ADMISSION NOTE  Date: 11/22/2011  History of Present Illness: 71 year old female with PMH relevant for COPD, DM2, HTN, obesity, CAD and autoimmune hepatitis with cirrhosis. Presents with two days history of progressive SOB on minimal exertion and at rest. Denies increased cough, sputum production, wheezing, fever, LE edema, orthopnea, PND. At baseline she can walk longer distances. She does not have history of frequent exacerbations and has not been in the hospital this year. She takes Advair and albuterol at home. Of note, her husband is hospitalized.    :   Microbiology/Sepsis markers: Results for orders placed during the hospital encounter of 03/19/08  URINE CULTURE     Status: Normal   Collection Time   03/20/08 12:30 AM      Component Value Range Status Comment   Specimen Description URINE, CATHETERIZED   Final    Special Requests NONE   Final    Colony Count >=100,000 COLONIES/ML   Final    Culture     Final    Value: Multiple bacterial morphotypes present, none predominant. Suggest appropriate recollection if clinically indicated.   Report Status 03/21/2008 FINAL   Final     Anti-infectives:  Anti-infectives     Start     Dose/Rate Route Frequency Ordered Stop   11/22/11 2045   azithromycin (ZITHROMAX) 500 mg in dextrose 5 % 250 mL IVPB        500 mg 250 mL/hr over 60 Minutes Intravenous Every 24 hours 11/22/11 2041               Studies: Dg Chest 2 View  11/22/2011  *RADIOLOGY REPORT*  Clinical Data: Breath.  Asthma.  COPD.  CHEST - 2 VIEW  Comparison: 12/11/2010.  Findings: Cardiopericardial silhouette is within normal limits and unchanged from prior.  Stable prominence of pulmonary hila.  No airspace disease.  No effusion.  Aortic arch appears within normal limits.  IMPRESSION: No active cardiopulmonary disease or  interval change.  Original Report Authenticated By: Andreas Newport, M.D.   Ct Angio Chest W/cm &/or Wo Cm  11/22/2011  *RADIOLOGY REPORT*  Clinical Data: Leg swelling.  Shortness of breath  CT ANGIOGRAPHY CHEST  Technique:  Multidetector CT imaging of the chest using the standard protocol during bolus administration of intravenous contrast. Multiplanar reconstructed images including MIPs were obtained and reviewed to evaluate the vascular anatomy.  Contrast:  100 ml of omni 300  Comparison: 01/11/2005  Findings:  No axillary or supraclavicular adenopathy noted.  There is no mediastinal or hilar adenopathy identified.  Calcifications in the LAD and left circumflex coronary arteries noted.  Scar versus plate-like atelectasis is noted in both upper lobes.  There is no airspace consolidation identified.  There is mild multilevel spondylosis within the thoracic spine.  There are no worrisome lytic or sclerotic lesions identified.  The pulmonary arteries appear patent.  No evidence for acute pulmonary embolus.  Limited imaging through the upper abdomen shows morphologic features the liver compatible with cirrhosis.  IMPRESSION:  1.  No evidence for acute pulmonary embolus.  No active cardiopulmonary abnormalities. 2.  Cirrhosis of the liver.  Original Report Authenticated By: Rosealee Albee, M.D.     Events:   Past Medical History  Diagnosis Date  . Chronic airway obstruction, not elsewhere classified   . Unspecified chronic bronchitis   . Unspecified essential  hypertension   . Coronary atherosclerosis of unspecified type of vessel, native or graft   . Palpitations   . Aortic valve disorders   . Pure hypercholesterolemia   . Type II or unspecified type diabetes mellitus without mention of complication, not stated as uncontrolled   . Morbid obesity   . Esophageal reflux   . Angiodysplasia of intestine (without mention of hemorrhage)   . Diverticulosis of colon (without mention of hemorrhage)   .  Benign neoplasm of colon   . Autoimmune hepatitis   . Cystitis, unspecified   . Osteoarthrosis, unspecified whether generalized or localized, unspecified site   . Osteoporosis, unspecified   . Restless legs syndrome (RLS)   . Major depressive disorder, recurrent episode, severe, specified as with psychotic behavior   . Iron deficiency anemia secondary to blood loss (chronic)     Past Surgical History  Procedure Date  . Hemorroidectomy   . Vaginal hysterectomy   . Appendectomy     Family History  Problem Relation Age of Onset  . Heart disease Mother   . Breast cancer Sister   . Cervical cancer Mother   . Diabetes Father     Social History:  reports that she quit smoking about 19 years ago. Her smoking use included Cigarettes. She has a 37.5 pack-year smoking history. She has never used smokeless tobacco. She reports that she does not drink alcohol or use illicit drugs.  Allergies:  Allergies  Allergen Reactions  . Aspirin     Causes nosebleeds  . Penicillins     REACTION: itching  . Theophylline     REACTION: nausea and vomiting    Medications:  Prior to Admission medications   Medication Sig Start Date End Date Taking? Authorizing Provider  albuterol (PROAIR HFA) 108 (90 BASE) MCG/ACT inhaler Inhale 2 puffs into the lungs every 6 (six) hours as needed.     Yes Historical Provider, MD  ALPRAZolam Prudy Feeler) 1 MG tablet Take 1 mg by mouth at bedtime as needed.    Yes Historical Provider, MD  Armodafinil (NUVIGIL) 150 MG tablet Take 150 mg by mouth daily.    Yes Historical Provider, MD  atenolol (TENORMIN) 50 MG tablet Take 50 mg by mouth daily.   Yes Historical Provider, MD  b complex vitamins tablet Take 1 tablet by mouth daily.    Yes Historical Provider, MD  buPROPion (WELLBUTRIN XL) 150 MG 24 hr tablet Take 150 mg by mouth daily.   Yes Historical Provider, MD  carbidopa-levodopa (SINEMET CR) 25-100 MG per tablet Take 1 tablet by mouth 2 (two) times daily. 1 at lunch and  1 at dinner 11/22/11  Yes Historical Provider, MD  clotrimazole (LOTRIMIN) 1 % cream Apply topically 2 (two) times daily.     Yes Historical Provider, MD  desvenlafaxine (PRISTIQ) 50 MG 24 hr tablet Take 50 mg by mouth daily.    Yes Historical Provider, MD  Fluticasone-Salmeterol (ADVAIR DISKUS) 250-50 MCG/DOSE AEPB Inhale 1 puff into the lungs every 12 (twelve) hours.  11/22/11  Yes Historical Provider, MD  furosemide (LASIX) 40 MG tablet Take 40 mg by mouth daily. 11/23/11  Yes Historical Provider, MD  glimepiride (AMARYL) 4 MG tablet Take 4 mg by mouth daily before breakfast.   Yes Historical Provider, MD  guaiFENesin (MUCINEX) 600 MG 12 hr tablet Take 1,200 mg by mouth 2 (two) times daily.  11/22/11  Yes Historical Provider, MD  insulin glargine (LANTUS SOLOSTAR) 100 UNIT/ML injection 20 units daily 06/14/11  Yes  Michele Mcalpine, MD  metFORMIN (GLUCOPHAGE) 500 MG tablet Take 1,000 mg by mouth 2 (two) times daily with a meal.   Yes Historical Provider, MD  Multiple Vitamins-Minerals (CENTRUM SILVER PO) Take 1 tablet by mouth daily.  11/22/11  Yes Historical Provider, MD  omeprazole (PRILOSEC) 20 MG capsule Take 20 mg by mouth 2 (two) times daily. 11/22/11  Yes Historical Provider, MD  potassium chloride SA (K-DUR,KLOR-CON) 20 MEQ tablet Take 20 mEq by mouth daily. 11/23/11  Yes Historical Provider, MD  promethazine (PHENERGAN) 25 MG tablet Take 25 mg by mouth every 6 (six) hours as needed.     Yes Historical Provider, MD  QUEtiapine (SEROQUEL) 100 MG tablet Take 200 mg by mouth at bedtime.  11/22/11  Yes Historical Provider, MD  risperiDONE (RISPERDAL) 3 MG tablet Take 1.5 mg by mouth at bedtime. Take 1/2 at bedtime 11/22/11  Yes Historical Provider, MD  simvastatin (ZOCOR) 40 MG tablet Take 40 mg by mouth every evening. 11/22/11  Yes Historical Provider, MD  sitaGLIPtin (JANUVIA) 100 MG tablet Take 100 mg by mouth every evening.   Yes Historical Provider, MD  Vitamin D, Ergocalciferol, (DRISDOL) 50000 UNITS  CAPS Take 50,000 Units by mouth. Take on friday   Yes Historical Provider, MD    A comprehensive review of systems was negative.  Vital signs for last 24 hours: Temp:  [98.5 F (36.9 C)] 98.5 F (36.9 C) (03/29 1147) Pulse Rate:  [66-70] 66  (03/29 1713) Resp:  [12-22] 12  (03/29 1713) BP: (131-156)/(67-106) 154/106 mmHg (03/29 1713) SpO2:  [95 %-100 %] 100 % (03/29 1713) Weight:  [212 lb (96.163 kg)] 212 lb (96.163 kg) (03/29 1440)    Physical Exam:  General: No apparent distress. Eyes: Anicteric sclerae. ENT: Oropharynx clear. Moist mucous membranes. No thrush Lymph: No cervical, supraclavicular, or axillary lymphadenopathy. Heart: Normal S1, S2. Systolic murmur II/VI aortic area, no rubs, or gallops appreciated. No bruits, equal pulses. Lungs: Decreased  air entry bilaterally. No wheezing or crackles. Abdomen: Abdomen soft, non-tender and not distended, normoactive bowel sounds. No hepatosplenomegaly or masses. Musculoskeletal: No clubbing or synovitis. Extremities: 1 + LE edema L>R. Skin: No rashes or lesions Neuro: No focal neurologic deficits.  LAB RESULT Results for orders placed during the hospital encounter of 11/22/11 (from the past 24 hour(s))  GLUCOSE, CAPILLARY     Status: Abnormal   Collection Time   11/22/11 12:07 PM      Component Value Range   Glucose-Capillary 278 (*) 70 - 99 (mg/dL)  POCT I-STAT TROPONIN I     Status: Abnormal   Collection Time   11/22/11  2:04 PM      Component Value Range   Troponin i, poc 0.10 (*) 0.00 - 0.08 (ng/mL)   Comment NOTIFIED PHYSICIAN     Comment 3           POCT I-STAT, CHEM 8     Status: Abnormal   Collection Time   11/22/11  2:07 PM      Component Value Range   Sodium 140  135 - 145 (mEq/L)   Potassium 3.8  3.5 - 5.1 (mEq/L)   Chloride 107  96 - 112 (mEq/L)   BUN 19  6 - 23 (mg/dL)   Creatinine, Ser 1.61  0.50 - 1.10 (mg/dL)   Glucose, Bld 096 (*) 70 - 99 (mg/dL)   Calcium, Ion 0.45  1.12 - 1.32 (mmol/L)   TCO2  21  0 - 100 (mmol/L)   Hemoglobin  10.5 (*) 12.0 - 15.0 (g/dL)   HCT 16.1 (*) 09.6 - 46.0 (%)  CARDIAC PANEL(CRET KIN+CKTOT+MB+TROPI)     Status: Abnormal   Collection Time   11/22/11  3:44 PM      Component Value Range   Total CK 149  7 - 177 (U/L)   CK, MB 4.4 (*) 0.3 - 4.0 (ng/mL)   Troponin I <0.30  <0.30 (ng/mL)   Relative Index 3.0 (*) 0.0 - 2.5   TROPONIN I     Status: Normal   Collection Time   11/22/11  5:10 PM      Component Value Range   Troponin I <0.30  <0.30 (ng/mL)   ABG: 7.45/37/64/26/93  Assessment/Plan: 71 year old female presents with increased SOB, no wheezing or increased cough or sputum production. No history of recent hospitalizations or exacerbations. ABG and vital signs stable on 2 L [place]. Chest X ray with no acute infiltrates. CT angiogram negative for PE.  1) Acute COPD exacerbation. ABG stable, no respiratory acidosis - Admit to regular floor non telemetry - Prednisone 40 mg daily - Azithromycin 500 mg IV daily - Continue Advair - Albuterol nebulizations q 6 hrs and q2 hrs PRN 2) Diabetes mellitus - Will continue Lantus 20 U SQ daily - Novolog sliding scale - Will hold Januvia and oral antidiabetics 3) Continue rest of home medications 4) DVT prophylaxis with SQ heparin, ambulate with assistance 5) GI prophylaxis  - Will continue PPI 6) Diet - Low carb diet   -->family updated at bedside   Overton Mam, M.D. Pulmonary and Critical Care Medicine Call E-link with questions [phone]  11/22/2011

## 2011-11-22 NOTE — ED Notes (Signed)
CBG 369 

## 2011-11-22 NOTE — ED Notes (Signed)
Patient reports she was walking in to see her husband and she became short of breath.  She has hx of copd.  Patient denies chest pain.   She denies cough,  Denies fever.  She denies any swelling

## 2011-11-23 DIAGNOSIS — E1369 Other specified diabetes mellitus with other specified complication: Secondary | ICD-10-CM

## 2011-11-23 DIAGNOSIS — J441 Chronic obstructive pulmonary disease with (acute) exacerbation: Principal | ICD-10-CM

## 2011-11-23 DIAGNOSIS — D509 Iron deficiency anemia, unspecified: Secondary | ICD-10-CM

## 2011-11-23 LAB — CBC
Platelets: 79 10*3/uL — ABNORMAL LOW (ref 150–400)
RBC: 3.05 MIL/uL — ABNORMAL LOW (ref 3.87–5.11)
RDW: 14.8 % (ref 11.5–15.5)
WBC: 4.2 10*3/uL (ref 4.0–10.5)

## 2011-11-23 LAB — COMPREHENSIVE METABOLIC PANEL
ALT: 31 U/L (ref 0–35)
AST: 22 U/L (ref 0–37)
Albumin: 3.1 g/dL — ABNORMAL LOW (ref 3.5–5.2)
CO2: 24 mEq/L (ref 19–32)
Chloride: 103 mEq/L (ref 96–112)
GFR calc non Af Amer: 86 mL/min — ABNORMAL LOW (ref >90)
Sodium: 137 mEq/L (ref 135–145)
Total Bilirubin: 0.6 mg/dL (ref 0.3–1.2)

## 2011-11-23 LAB — GLUCOSE, CAPILLARY
Glucose-Capillary: 287 mg/dL — ABNORMAL HIGH (ref 70–99)
Glucose-Capillary: 385 mg/dL — ABNORMAL HIGH (ref 70–99)
Glucose-Capillary: 399 mg/dL — ABNORMAL HIGH (ref 70–99)
Glucose-Capillary: 417 mg/dL — ABNORMAL HIGH (ref 70–99)

## 2011-11-23 MED ORDER — AZITHROMYCIN 250 MG PO TABS
250.0000 mg | ORAL_TABLET | Freq: Every day | ORAL | Status: DC
  Administered 2011-11-23 – 2011-11-26 (×4): 250 mg via ORAL
  Filled 2011-11-23 (×4): qty 1

## 2011-11-23 MED ORDER — INSULIN ASPART 100 UNIT/ML ~~LOC~~ SOLN
12.0000 [IU] | Freq: Once | SUBCUTANEOUS | Status: AC
  Administered 2011-11-23: 12 [IU] via SUBCUTANEOUS

## 2011-11-23 MED ORDER — INSULIN ASPART 100 UNIT/ML ~~LOC~~ SOLN: 0.0000 [IU] | Freq: Every day | SUBCUTANEOUS | Status: DC

## 2011-11-23 MED ORDER — INSULIN ASPART 100 UNIT/ML ~~LOC~~ SOLN
0.0000 [IU] | Freq: Every day | SUBCUTANEOUS | Status: DC
  Administered 2011-11-24: 3 [IU] via SUBCUTANEOUS
  Administered 2011-11-24 – 2011-11-25 (×2): 4 [IU] via SUBCUTANEOUS

## 2011-11-23 MED ORDER — INSULIN ASPART 100 UNIT/ML ~~LOC~~ SOLN
0.0000 [IU] | Freq: Three times a day (TID) | SUBCUTANEOUS | Status: DC
  Administered 2011-11-24: 15 [IU] via SUBCUTANEOUS
  Administered 2011-11-24: 7 [IU] via SUBCUTANEOUS
  Administered 2011-11-24: 15 [IU] via SUBCUTANEOUS
  Administered 2011-11-25: 4 [IU] via SUBCUTANEOUS
  Administered 2011-11-25: 11 [IU] via SUBCUTANEOUS
  Administered 2011-11-25: 20 [IU] via SUBCUTANEOUS
  Administered 2011-11-26: 11 [IU] via SUBCUTANEOUS

## 2011-11-23 NOTE — Progress Notes (Signed)
Subjective: History of Present Illness:  71 year old female with PMH relevant for COPD, DM2, HTN, obesity, CAD and autoimmune hepatitis with cirrhosis. Presents with two days history of progressive SOB on minimal exertion and at rest. Denies increased cough, sputum production, wheezing, fever, LE edema, orthopnea, PND. At baseline she can walk longer distances. She does not have history of frequent exacerbations and has not been in the hospital this year. She takes Advair and albuterol at home. Of note, her husband is hospitalized/ cholecystectomy.  Today- Feels much better. Hasn't recognized obvious infection. Blames fatigue from running back and forth dealing with husband's illness. Nebs help. Hx chronic anemia -denies any recent visible blood loss or melena. She asks for SCDs instead of Heparin. Noted bacteriuria- voided specimen. Denies any dysuria.   Objective: Vital signs in last 24 hours: Temp:  [97.3 F (36.3 C)-98.5 F (36.9 C)] 97.6 F (36.4 C) (03/30 0604) Pulse Rate:  [57-87] 57  (03/30 0604) Resp:  [12-22] 16  (03/30 0604) BP: (131-158)/(59-106) 134/59 mmHg (03/30 0604) SpO2:  [93 %-100 %] 95 % (03/30 0813) Weight:  [96 kg (211 lb 10.3 oz)-96.208 kg (212 lb 1.6 oz)] 96.208 kg (212 lb 1.6 oz) (03/30 0604)  Physical Exam:  General: No apparent distress. Taking neb. RT in room. Significantly obese Eyes: Anicteric sclerae.  ENT: Oropharynx clear. Moist mucous membranes. No thrush  Lymph: No cervical, supraclavicular, or axillary lymphadenopathy.  Heart: Normal S1, S2. Systolic murmur II/VI aortic area, no rubs, or gallops appreciated. No bruits, equal pulses.  Lungs:  Shallow, quiet, unlabored. Abdomen: Abdomen soft, non-tender and not distended, normoactive bowel sounds. .  Musculoskeletal: No clubbing Extremities: Trace + LE edema L>R.  Skin: No rashes or lesions  Neuro: No focal neurologic deficits.   Lab Results:  Basename 11/23/11 0500 11/22/11 1407  WBC 4.2 --    HGB 9.9* 10.5*  HCT 29.9* 31.0*  PLT 79* --   BMET  Basename 11/23/11 0500 11/22/11 1407  NA 137 140  K 4.1 3.8  CL 103 107  CO2 24 --  GLUCOSE 292* 319*  BUN 18 19  CREATININE 0.73 0.90  CALCIUM 9.4 --    Studies/Results: Dg Chest 2 View  11/22/2011  *RADIOLOGY REPORT*  Clinical Data: Breath.  Asthma.  COPD.  CHEST - 2 VIEW  Comparison: 12/11/2010.  Findings: Cardiopericardial silhouette is within normal limits and unchanged from prior.  Stable prominence of pulmonary hila.  No airspace disease.  No effusion.  Aortic arch appears within normal limits.  IMPRESSION: No active cardiopulmonary disease or interval change.  Original Report Authenticated By: Andreas Newport, M.D.   Ct Angio Chest W/cm &/or Wo Cm  11/22/2011  *RADIOLOGY REPORT*  Clinical Data: Leg swelling.  Shortness of breath  CT ANGIOGRAPHY CHEST  Technique:  Multidetector CT imaging of the chest using the standard protocol during bolus administration of intravenous contrast. Multiplanar reconstructed images including MIPs were obtained and reviewed to evaluate the vascular anatomy.  Contrast:  100 ml of omni 300  Comparison: 01/11/2005  Findings:  No axillary or supraclavicular adenopathy noted.  There is no mediastinal or hilar adenopathy identified.  Calcifications in the LAD and left circumflex coronary arteries noted.  Scar versus plate-like atelectasis is noted in both upper lobes.  There is no airspace consolidation identified.  There is mild multilevel spondylosis within the thoracic spine.  There are no worrisome lytic or sclerotic lesions identified.  The pulmonary arteries appear patent.  No evidence for acute pulmonary embolus.  Limited imaging through the upper abdomen shows morphologic features the liver compatible with cirrhosis.  IMPRESSION:  1.  No evidence for acute pulmonary embolus.  No active cardiopulmonary abnormalities. 2.  Cirrhosis of the liver.  Original Report Authenticated By: Rosealee Albee, M.D.     Medications: reviewd  Assessment/Plan: 1) AECOPD- improving. Plan- change IV zithromax to po.Continue other meds today. 2) DM- Continue SSI 3) DVT prophy- changing heparin to SCDs 4) Chronic iron deficiency anemia- denies acute blood loss. Hgb 9.9  LOS: 1 day   Courtland Coppa D 11/23/2011, 8:33 AM

## 2011-11-24 DIAGNOSIS — D500 Iron deficiency anemia secondary to blood loss (chronic): Secondary | ICD-10-CM

## 2011-11-24 MED ORDER — PREDNISONE 20 MG PO TABS
30.0000 mg | ORAL_TABLET | Freq: Every day | ORAL | Status: DC
  Administered 2011-11-25: 30 mg via ORAL
  Filled 2011-11-24 (×2): qty 1

## 2011-11-24 MED ORDER — ALBUTEROL SULFATE (5 MG/ML) 0.5% IN NEBU
2.5000 mg | INHALATION_SOLUTION | Freq: Four times a day (QID) | RESPIRATORY_TRACT | Status: DC
  Administered 2011-11-24 – 2011-11-26 (×5): 2.5 mg via RESPIRATORY_TRACT
  Filled 2011-11-24 (×6): qty 0.5

## 2011-11-24 NOTE — Progress Notes (Signed)
Subjective: History of Present Illness:  71 year old female with PMH relevant for COPD, DM2, HTN, obesity, CAD and autoimmune hepatitis with cirrhosis. Presents with two days history of progressive SOB on minimal exertion and at rest. Denies increased cough, sputum production, wheezing, fever, LE edema, orthopnea, PND. At baseline she can walk longer distances. She does not have history of frequent exacerbations and has not been in the hospital this year. She takes Advair and albuterol at home. Of note, her husband is hospitalized/ cholecystectomy.  Today-Feeling much improved. Slept well. Has O2 at home/ [facility], used 3-4 L/M only when needed. Denies new concerns. Has f/u appt w/ Dr Kriste Basque in July.   Objective: Vital signs in last 24 hours: Temp:  [97.7 F (36.5 C)-98.8 F (37.1 C)] 97.7 F (36.5 C) (03/31 0455) Pulse Rate:  [51-56] 51  (03/31 0455) Resp:  [18-20] 18  (03/31 0455) BP: (106-142)/(58-64) 129/62 mmHg (03/31 0455) SpO2:  [93 %-95 %] 93 % (03/31 0455) Weight:  [97.3 kg (214 lb 8.1 oz)] 97.3 kg (214 lb 8.1 oz) (03/31 0455)  Physical Exam:  General: No apparent distress. Taking neb. RT in room. Significantly obese. Sitting edge of bed, eating. Flat affect Eyes: no conjunctival injection, gaze conjugate ENT: Eating, gross hearing intact Lymph: No cervical, supraclavicular, or axillary lymphadenopathy.  Heart: Normal S1, S2. Systolic murmur II/VI aortic area, no rubs, or gallops appreciated. No bruits, equal pulses.  Lungs:  Shallow, quiet, unlabored. Abdomen: eating  Musculoskeletal: No clubbing Extremities: Trace + LE edema L>R.  Skin: No rashes or lesions  Neuro: No focal neurologic deficits.   Lab Results:  Basename 11/23/11 0500 11/22/11 1407  WBC 4.2 --  HGB 9.9* 10.5*  HCT 29.9* 31.0*  PLT 79* --   BMET  Basename 11/23/11 0500 11/22/11 1407  NA 137 140  K 4.1 3.8  CL 103 107  CO2 24 --  GLUCOSE 292* 319*  BUN 18 19  CREATININE 0.73 0.90  CALCIUM 9.4  --   CBG (last 3)   Basename 11/24/11 0612 11/23/11 2212 11/23/11 1620  GLUCAP 237* 287* 399*      Studies/Results: Dg Chest 2 View  11/22/2011  *RADIOLOGY REPORT*  Clinical Data: Breath.  Asthma.  COPD.  CHEST - 2 VIEW  Comparison: 12/11/2010.  Findings: Cardiopericardial silhouette is within normal limits and unchanged from prior.  Stable prominence of pulmonary hila.  No airspace disease.  No effusion.  Aortic arch appears within normal limits.  IMPRESSION: No active cardiopulmonary disease or interval change.  Original Report Authenticated By: Andreas Newport, M.D.   Ct Angio Chest W/cm &/or Wo Cm  11/22/2011  *RADIOLOGY REPORT*  Clinical Data: Leg swelling.  Shortness of breath  CT ANGIOGRAPHY CHEST  Technique:  Multidetector CT imaging of the chest using the standard protocol during bolus administration of intravenous contrast. Multiplanar reconstructed images including MIPs were obtained and reviewed to evaluate the vascular anatomy.  Contrast:  100 ml of omni 300  Comparison: 01/11/2005  Findings:  No axillary or supraclavicular adenopathy noted.  There is no mediastinal or hilar adenopathy identified.  Calcifications in the LAD and left circumflex coronary arteries noted.  Scar versus plate-like atelectasis is noted in both upper lobes.  There is no airspace consolidation identified.  There is mild multilevel spondylosis within the thoracic spine.  There are no worrisome lytic or sclerotic lesions identified.  The pulmonary arteries appear patent.  No evidence for acute pulmonary embolus.  Limited imaging through the upper abdomen shows  morphologic features the liver compatible with cirrhosis.  IMPRESSION:  1.  No evidence for acute pulmonary embolus.  No active cardiopulmonary abnormalities. 2.  Cirrhosis of the liver.  Original Report Authenticated By: Rosealee Albee, M.D.    Medications: reviewed  Assessment/Plan: 1) AECOPD- improving. Plan- reduce prednisone. ? Possibly home  tomorrow. Mobilize. 2) DM- Continue SSI. Expect improvement w/ steroid reduction 3) DVT prophy- changing heparin to SCDs per request, due to hx anemia 4) Chronic iron deficiency anemia- denies acute blood loss. Hgb 9.9 Plan- recheck CBC 5) Autoimmune hepatitis- Plan- rechecking BMET. Will order CMET for liver status.  LOS: 2 days   Thierno Hun D 11/24/2011, 8:00 AM

## 2011-11-25 DIAGNOSIS — J449 Chronic obstructive pulmonary disease, unspecified: Secondary | ICD-10-CM

## 2011-11-25 LAB — COMPREHENSIVE METABOLIC PANEL
ALT: 29 U/L (ref 0–35)
CO2: 25 mEq/L (ref 19–32)
Calcium: 8.8 mg/dL (ref 8.4–10.5)
Creatinine, Ser: 0.8 mg/dL (ref 0.50–1.10)
GFR calc Af Amer: 86 mL/min — ABNORMAL LOW (ref >90)
GFR calc non Af Amer: 74 mL/min — ABNORMAL LOW (ref >90)
Glucose, Bld: 198 mg/dL — ABNORMAL HIGH (ref 70–99)
Sodium: 138 mEq/L (ref 135–145)

## 2011-11-25 LAB — CBC
HCT: 28.4 % — ABNORMAL LOW (ref 36.0–46.0)
Hemoglobin: 9.3 g/dL — ABNORMAL LOW (ref 12.0–15.0)
MCH: 32.1 pg (ref 26.0–34.0)
MCV: 97.9 fL (ref 78.0–100.0)
RBC: 2.9 MIL/uL — ABNORMAL LOW (ref 3.87–5.11)

## 2011-11-25 LAB — GLUCOSE, CAPILLARY
Glucose-Capillary: 197 mg/dL — ABNORMAL HIGH (ref 70–99)
Glucose-Capillary: 296 mg/dL — ABNORMAL HIGH (ref 70–99)

## 2011-11-25 LAB — DIFFERENTIAL
Basophils Relative: 1 % (ref 0–1)
Lymphocytes Relative: 10 % — ABNORMAL LOW (ref 12–46)
Monocytes Absolute: 0.5 10*3/uL (ref 0.1–1.0)

## 2011-11-25 MED ORDER — ACETAMINOPHEN 325 MG PO TABS
650.0000 mg | ORAL_TABLET | Freq: Four times a day (QID) | ORAL | Status: DC | PRN
  Administered 2011-11-25: 650 mg via ORAL
  Filled 2011-11-25: qty 2

## 2011-11-25 MED ORDER — PREDNISONE 20 MG PO TABS
40.0000 mg | ORAL_TABLET | Freq: Every day | ORAL | Status: DC
  Administered 2011-11-26: 40 mg via ORAL
  Filled 2011-11-25 (×2): qty 2

## 2011-11-25 NOTE — Progress Notes (Signed)
Clinical Social Work Department BRIEF PSYCHOSOCIAL ASSESSMENT 11/25/2011  Patient:  ELDEAN, KLATT     Account Number:  [id number]     Admit date:  11/22/2011  Clinical Social Worker:  Margaree Mackintosh  Date/Time:  11/25/2011 03:00 PM  Referred by:  Physician  Date Referred:  11/25/2011 Referred for  Psychosocial assessment   Other Referral:   Interview type:  Patient Other interview type:    PSYCHOSOCIAL DATA Living Status:  FAMILY Admitted from facility:   Level of care:   Primary support name:  [phone] Primary support relationship to patient:  SPOUSE Degree of support available:   unknown; spouse recently had emerent gallbladder surgery. is being cared for by neighbor.    CURRENT CONCERNS Current Concerns  Other - See comment   Other Concerns:    SOCIAL WORK ASSESSMENT / PLAN Clinical Social Worker met with pt at bedside.  CSW introduced self and explained role.  CSW provided emotional support and opportunity for pt to process difficult feelings.  Pt shared that she is the main caregiver for several family members, most notably her spouse recently had emergent gallbladder surger and her dtr, who has a lupus diagnosis, resides in a nursing home.  Pt and CSW discussed safe dc options.  Pt currently recieves assistance with [facility] and would like to continue to recieve services with them.    CSW made referral to [facility] for pt to recieve additional supports in community.      CSW to sign off, please re consult if additional needs arise.   Assessment/plan status:   Other assessment/ plan:   Information/referral to community resources:    PATIENT'S/FAMILY'S RESPONSE TO PLAN OF CARE: Pt was pleasant and appropriately engaged.  Pt appeared to communicate openly.  Pt thanked CSW for intervention.      Angelia Mould, MSW, [place] [phone]

## 2011-11-25 NOTE — Progress Notes (Signed)
Subjective: History of Present Illness:  71 year old female with PMH relevant for COPD, DM2, HTN, obesity, CAD and autoimmune hepatitis with cirrhosis. Presents with two days history of progressive SOB on minimal exertion and at rest. Denies increased cough, sputum production, wheezing, fever, LE edema, orthopnea, PND. At baseline she can walk longer distances. She does not have history of frequent exacerbations and has not been in the hospital this year. She takes Advair and albuterol at home. Of note, her husband is hospitalized/ cholecystectomy.  Today-Pt improved. Less dyspnea. Still weak.  RN states pt is non adherent with DM CBG checks  Objective: Vital signs in last 24 hours: Temp:  [97.7 F (36.5 C)-99.2 F (37.3 C)] 97.7 F (36.5 C) (04/01 1349) Pulse Rate:  [59-62] 59  (04/01 1349) Resp:  [18-20] 20  (04/01 1349) BP: (128-157)/(57-80) 153/57 mmHg (04/01 1349) SpO2:  [92 %-96 %] 93 % (04/01 1349) FiO2 (%):  [2 %] 2 % (04/01 0426) Weight:  [97.7 kg (215 lb 6.2 oz)] 97.7 kg (215 lb 6.2 oz) (04/01 0426)  Physical Exam:  General: No apparent distress. Significantly obese.  Flat affect Eyes: no conjunctival injection, gaze conjugate ENT: Eating, gross hearing intact Lymph: No cervical, supraclavicular, or axillary lymphadenopathy.  Heart: Normal S1, S2. Systolic murmur II/VI aortic area, no rubs, or gallops appreciated. No bruits, equal pulses.  Lungs:  Shallow, quiet, unlabored. No wheeze Abdomen: eating  Musculoskeletal: No clubbing Extremities: Trace + LE edema L>R.  Skin: No rashes or lesions  Neuro: No focal neurologic deficits.   Lab Results:  Basename 11/25/11 0707 11/23/11 0500  WBC 3.2* 4.2  HGB 9.3* 9.9*  HCT 28.4* 29.9*  PLT 71* 79*   BMET  Basename 11/25/11 0707 11/23/11 0500  NA 138 137  K 3.5 4.1  CL 105 103  CO2 25 24  GLUCOSE 198* 292*  BUN 16 18  CREATININE 0.80 0.73  CALCIUM 8.8 9.4   CBG (last 3)   Basename 11/25/11 0629 11/24/11 2123  11/24/11 1609  GLUCAP 197* 308* 319*     Lab 11/25/11 0707 11/23/11 0500  AST 29 22  ALT 29 31  ALKPHOS 97 94  BILITOT 0.8 0.6  PROT 5.7* 6.2  ALBUMIN 3.0* 3.1*  INR -- --     Studies/Results:   Medications: reviewed  Assessment/Plan: 1) AECOPD- improving. Plan- cont pred 40mg /d   , plan d/c AM 11/26/11 with HHRN 2) DM- Continue SSI. Expect improvement w/ steroid reduction 3) DVT prophy-cont SCDs per request, due to hx anemia 4) Chronic iron deficiency anemia- denies acute blood loss., plan note slow hgb drift , cont Fe 5) Autoimmune hepatitis- LFTs normal Plan  observe   -  LOS: 3 days    Shan Levans 11/25/2011, 2:30 PM Beeper  [phone]  Cell  [phone]  If no response or cell goes to voicemail, call beeper [phone]

## 2011-11-25 NOTE — Progress Notes (Signed)
Chart review complete.  Patient is medically appropriate for services, but has a out of Chartered loss adjuster. Please referFor any additional questions or new referrals please contact Anibal Henderson BSN RN [facility] Liaison at [phone].  to [facility] onsite liaison Bertram [place] [phone].

## 2011-11-25 NOTE — Plan of Care (Signed)
Problem: Food- and Nutrition-Related Knowledge Deficit (NB-1.1) Goal: Nutrition education Formal process to instruct or train a patient/client in a skill or to impart knowledge to help patients/clients voluntarily manage or modify food choices and eating behavior to maintain or improve health.  Outcome: Completed/Met Date Met:  11/25/11 RD consulted for DM education. Pt explained that she does not follow any dietary restrictions, eats out mostly, and only checks BS about once a month. Pt willing to make changes to check BS more often, once a day at least and follow a moderate carb diet. RD explained recommendations and carb counting. Pt willing to follow a plate method for consistent carbohydrates. Pt seemed very overwhelmed by the information and hesitant to commit to making any changes. Would likely benefit from attending out patient DM classes.  While in rm RD also went over the HF booklet that was already in the room. Pt states that she will try to weight more often and use less salt on food.  Diet: Carb Mod medium, PO intake 100% last meal. Body mass index is 36.97 kg/(m^2). Obesity.  CBG (last 3)   Basename 11/25/11 0629 11/24/11 2123 11/24/11 1609  GLUCAP 197* 308* 319*      Lab Results  Component Value Date    HGBA1C 6.8* 10/25/2011    Clarene Duke MARIE #[phone]

## 2011-11-25 NOTE — Progress Notes (Signed)
   CARE MANAGEMENT NOTE 11/25/2011  Patient:  [name], [name]   Account Number:  [id number]  Date Initiated:  11/25/2011  Documentation initiated by:  Gardenia Witter  Subjective/Objective Assessment:   PT ADM WITH COPD EXACERBATION ON 11/22/11.  PTA, PT LIVES WITH SPOUSE.     Action/Plan:   PT FOR DISCHARGE HOME ON 12/15/11.  SHE WILL NEED CONTINUATION OF HHRN AT HOME WITH [facility] AS PRIOR TO ADMISSSION.   Anticipated DC Date:  11/26/2011   Anticipated DC Plan:  HOME W HOME HEALTH SERVICES      DC Planning Services  CM consult      [facility] Choice  HOME HEALTH   Choice offered to / List presented to:  C-1 Patient        HH arranged  HH-1 RN      [facility] agency  [facility]   Status of service:  Completed, signed off Medicare Important Message given?   (If response is "NO", the following Medicare IM given date fields will be blank) Date Medicare IM given:   Date Additional Medicare IM given:    Discharge Disposition:  HOME W HOME HEALTH SERVICES  Per UR Regulation:    If discussed at Long Length of Stay Meetings, dates discussed:    Comments:  11/25/11 Jolon Degante,RN,BSN NOTIFIED [facility] OF HOME HEALTH RESUMPTION AND PLANNED DISCHARGE TOMORROW. Phone #[phone]

## 2011-11-25 NOTE — Plan of Care (Signed)
Problem: Consults Goal: Nutrition Consult-if indicated Outcome: Completed/Met Date Met:  11/25/11  Talking with pt she has not been following carb modified. Dietician in to talk with pt about carb modified diet and left printed material. Marisa Cyphers RN

## 2011-11-26 ENCOUNTER — Telehealth: Payer: Self-pay | Admitting: Pulmonary Disease

## 2011-11-26 LAB — CBC
HCT: 27.7 % — ABNORMAL LOW (ref 36.0–46.0)
Hemoglobin: 9 g/dL — ABNORMAL LOW (ref 12.0–15.0)
MCH: 31.7 pg (ref 26.0–34.0)
MCHC: 32.5 g/dL (ref 30.0–36.0)
MCV: 97.5 fL (ref 78.0–100.0)

## 2011-11-26 LAB — GLUCOSE, CAPILLARY: Glucose-Capillary: 336 mg/dL — ABNORMAL HIGH (ref 70–99)

## 2011-11-26 MED ORDER — INSULIN GLARGINE 100 UNIT/ML ~~LOC~~ SOLN: 30.0000 [IU] | Freq: Every day | SUBCUTANEOUS | Status: DC

## 2011-11-26 MED ORDER — PREDNISONE 20 MG PO TABS: ORAL_TABLET | ORAL | Status: DC

## 2011-11-26 MED ORDER — AZITHROMYCIN 250 MG PO TABS: ORAL_TABLET | ORAL | Status: DC

## 2011-11-26 NOTE — Telephone Encounter (Signed)
I spoke with spouse and made him aware 1 sample of advair has been left upfront but did not have any samples of januvia. He voiced his understanding and had no questions

## 2011-11-26 NOTE — Discharge Instructions (Signed)
Chronic Obstructive Pulmonary Disease Chronic obstructive pulmonary disease (COPD) is a condition in which airflow from the lungs is restricted. The lungs can never return to normal, but there are measures you can take which will improve them and make you feel better. CAUSES   Smoking.   Exposure to secondhand smoke.   Breathing in irritants (pollution, cigarette smoke, strong smells, aerosol sprays, paint fumes).   History of lung infections.  TREATMENT  Treatment focuses on making you comfortable (supportive care). Your caregiver may prescribe medications (inhaled or pills) to help improve your breathing. HOME CARE INSTRUCTIONS   If you smoke, stop smoking.   Avoid exposure to smoke, chemicals, and fumes that aggravate your breathing.   Take antibiotic medicines as directed by your caregiver.   Avoid medicines that dry up your system and slow down the elimination of secretions (antihistamines and cough syrups). This decreases respiratory capacity and may lead to infections.   Drink enough water and fluids to keep your urine clear or pale yellow. This loosens secretions.   Use humidifiers at home and at your bedside if they do not make breathing difficult.   Receive all protective vaccines your caregiver suggests, especially pneumococcal and influenza.   Use home oxygen as suggested.   Stay active. Exercise and physical activity will help maintain your ability to do things you want to do.   Eat a healthy diet.  SEEK MEDICAL CARE IF:   You develop pus-like mucus (sputum).   Breathing is more labored or exercise becomes difficult to do.   You are running out of the medicine you take for your breathing.  SEEK IMMEDIATE MEDICAL CARE IF:   You have a rapid heart rate.   You have agitation, confusion, tremors, or are in a stupor (family members may need to observe this).   It becomes difficult to breathe.   You develop chest pain.   You have a fever.  MAKE SURE YOU:    Understand these instructions.   Will watch your condition.   Will get help right away if you are not doing well or get worse.  Document Released: 05/22/2005 Document Revised: 08/01/2011 Document Reviewed: 10/12/2010 [facility] Patient Information 2012 [place], [place].Blood Sugar Monitoring, Adult GLUCOSE METERS FOR SELF-MONITORING OF BLOOD GLUCOSE  It is important to be able to correctly measure your blood sugar (glucose). You can use a blood glucose monitor (a small battery-operated device) to check your glucose level at any time. This allows you and your caregiver to monitor your diabetes and to determine how well your treatment plan is working. The process of monitoring your blood glucose with a glucose meter is called self-monitoring of blood glucose (SMBG). When people with diabetes control their blood sugar, they have better health. To test for glucose with a typical glucose meter, place the disposable strip in the meter. Then place a small sample of blood on the "test strip." The test strip is coated with chemicals that combine with glucose in blood. The meter measures how much glucose is present. The meter displays the glucose level as a number. Several new models can record and store a number of test results. Some models can connect to personal computers to store test results or print them out.  Newer meters are often easier to use than older models. Some meters allow you to get blood from places other than your fingertip. Some new models have automatic timing, error codes, signals, or barcode readers to help with proper adjustment (calibration). Some meters have  a large display screen or spoken instructions for people with visual impairments.  INSTRUCTIONS FOR USING GLUCOSE METERS  Wash your hands with soap and warm water, or clean the area with alcohol. Dry your hands completely.   Prick the side of your fingertip with a lancet (a sharp-pointed tool used by hand).   Hold the hand down  and gently milk the finger until a small drop of blood appears. Catch the blood with the test strip.   Follow the instructions for inserting the test strip and using the SMBG meter. Most meters require the meter to be turned on and the test strip to be inserted before applying the blood sample.   Record the test result.   Read the instructions carefully for both the meter and the test strips that go with it. Meter instructions are found in the user manual. Keep this manual to help you solve any problems that may arise. Many meters use "error codes" when there is a problem with the meter, the test strip, or the blood sample on the strip. You will need the manual to understand these error codes and fix the problem.   New devices are available such as laser lancets and meters that can test blood taken from "alternative sites" of the body, other than fingertips. However, you should use standard fingertip testing if your glucose changes rapidly. Also, use standard testing if:   You have eaten, exercised, or taken insulin in the past 2 hours.   You think your glucose is low.   You tend to not feel symptoms of low blood glucose (hypoglycemia).   You are ill or under stress.   Clean the meter as directed by the manufacturer.   Test the meter for accuracy as directed by the manufacturer.   Take your meter with you to your caregiver's office. This way, you can test your glucose in front of your caregiver to make sure you are using the meter correctly. Your caregiver can also take a sample of blood to test using a routine lab method. If values on the glucose meter are close to the lab results, you and your caregiver will see that your meter is working well and you are using good technique. Your caregiver will advise you about what to do if the results do not match.  FREQUENCY OF TESTING  Your caregiver will tell you how often you should check your blood glucose. This will depend on your type of  diabetes, your current level of diabetes control, and your types of medicines. The following are general guidelines, but your care plan may be different. Record all your readings and the time of day you took them for review with your caregiver.   Diabetes type 1.   When you are using insulin with good diabetic control (either multiple daily injections or via a pump), you should check your glucose 4 times a day.   If your diabetes is not well controlled, you may need to monitor more frequently, including before meals and 2 hours after meals, at bedtime, and occasionally between 2 a.m. and 3 a.m.   You should always check your glucose before a dose of insulin or before changing the rate on your insulin pump.   Diabetes type 2.   Guidelines for SMBG in diabetes type 2 are not as well defined.   If you are on insulin, follow the guidelines above.   If you are on medicines, but not insulin, and your glucose is not well  controlled, you should test at least twice daily.   If you are not on insulin, and your diabetes is controlled with medicines or diet alone, you should test at least once daily, usually before breakfast.   A weekly profile will help your caregiver advise you on your care plan. The week before your visit, check your glucose before a meal and 2 hours after a meal at least daily. You may want to test before and after a different meal each day so you and your caregiver can tell how well controlled your blood sugars are throughout the course of a 24 hour period.   Gestational diabetes (diabetes during pregnancy).   Frequent testing is often necessary. Accurate timing is important.   If you are not on insulin, check your glucose 4 times a day. Check it before breakfast and 1 hour after the start of each meal.   If you are on insulin, check your glucose 6 times a day. Check it before each meal and 1 hour after the first bite of each meal.   General guidelines.   More frequent  testing is required at the start of insulin treatment. Your caregiver will instruct you.   Test your glucose any time you suspect you have low blood sugar (hypoglycemia).   You should test more often when you change medicines, when you have unusual stress or illness, or in other unusual circumstances.  OTHER THINGS TO KNOW ABOUT GLUCOSE METERS  Measurement Range. Most glucose meters are able to read glucose levels over a broad range of values from as low as 0 to as high as 600 mg/dL. If you get an extremely high or low reading from your meter, you should first confirm it with another reading. Report very high or very low readings to your caregiver.   Whole Blood Glucose versus Plasma Glucose. Some older home glucose meters measure glucose in your whole blood. In a lab or when using some newer home glucose meters, the glucose is measured in your plasma (one component of blood). The difference can be important. It is important for you and your caregiver to know whether your meter gives its results as "whole blood equivalent" or "plasma equivalent."   Display of High and Low Glucose Values. Part of learning how to operate a meter is understanding what the meter results mean. Know how high and low glucose concentrations are displayed on your meter.   Factors that Affect Glucose Meter Performance. The accuracy of your test results depends on many factors and varies depending on the brand and type of meter. These factors include:   Low red blood cell count (anemia).   Substances in your blood (such as uric acid, vitamin C, and others).   Environmental factors (temperature, humidity, altitude).   Name-brand versus generic test strips.   Calibration. Make sure your meter is set up properly. It is a good idea to do a calibration test with a control solution recommended by the manufacturer of your meter whenever you begin using a fresh bottle of test strips. This will help verify the accuracy of your  meter.   Improperly stored, expired, or defective test strips. Keep your strips in a dry place with the lid on.   Soiled meter.   Inadequate blood sample.  NEW TECHNOLOGIES FOR GLUCOSE TESTING Alternative site testing Some glucose meters allow testing blood from alternative sites. These include the:  Upper arm.   Forearm.   Base of the thumb.   Thigh.  Sampling  blood from alternative sites may be desirable. However, it may have some limitations. Blood in the fingertips show changes in glucose levels more quickly than blood in other parts of the body. This means that alternative site test results may be different from fingertip test results, not because of the meter's ability to test accurately, but because the actual glucose concentration can be different.  Continuous Glucose Monitoring Devices to measure your blood glucose continuously are available, and others are in development. These methods can be more expensive than self-monitoring with a glucose meter. However, it is uncertain how effective and reliable these devices are. Your caregiver will advise you if this approach makes sense for you. IF BLOOD SUGARS ARE CONTROLLED, PEOPLE WITH DIABETES REMAIN HEALTHIER.  SMBG is an important part of the treatment plan of patients with diabetes mellitus. Below are reasons for using SMBG:   It confirms that your glucose is at a specific, healthy level.   It detects hypoglycemia and severe hyperglycemia.   It allows you and your caregiver to make adjustments in response to changes in lifestyle for individuals requiring medicine.   It determines the need for starting insulin therapy in temporary diabetes that happens during pregnancy (gestational diabetes).  Document Released: 08/15/2003 Document Revised: 08/01/2011 Document Reviewed: 12/06/2010 [facility] Patient Information 2012 [place], [place].

## 2011-11-26 NOTE — Discharge Summary (Addendum)
Physician Discharge Summary     Patient ID: [name] MRN: [id number] DOB/AGE: 10-09-1940 71 y.o.  Admit date: 11/22/2011 Discharge date: 11/26/2011       Admission Diagnoses:  COPD exacerbation (11/22/2011)  Diabetes mellitus due to underlying condition with hyperglycemia (11/23/2011)  Anemia, iron deficiency (11/23/2011)       Discharge Diagnoses:     COPD exacerbation>>>improved  Diabetes mellitus due to underlying condition with hyperglycemia  Anemia, iron deficiency   Significant Hospital tests/ studies/ interventions and procedures   Lab 11/25/11 0707 11/23/11 0500 11/22/11 1407  NA 138 137 140  K 3.5 4.1 --  CL 105 103 107  CO2 25 24 --  GLUCOSE 198* 292* 319*  BUN 16 18 19   CREATININE 0.80 0.73 0.90  CALCIUM 8.8 9.4 --  MG -- -- --  PHOS -- -- --    Lab 11/26/11 0537 11/25/11 0707 11/23/11 0500  HGB 9.0* 9.3* 9.9*  HCT 27.7* 28.4* 29.9*  WBC 2.8* 3.2* 4.2  PLT 85* 71* 79*   CXR: Copd changes no infiltrate.    Hospital Course:  71 year old female that was admitted with COPD exacerbation on 11/22/2011. The patient was given IV steroids and oral azithromycin with slow improvement. She does have a long-standing history of angiodysplasia of the intestine with slow blood loss and iron deficiency anemia. The patient's hemoglobin was between 9 and 9.9 during the admission. This will need to be followed up as an outpatient.  Patient gradually improved and was ready for discharge by 11/26/11.  Due to corticosteroid use the patient's blood sugars were elevated and her Lantus dose had to be increased to 30 units daily.  Please note the patient's husband recently was discharged with acute cholecystitis status post gallbladder resection. The patient will have visiting nurses because of this, as  The  husband has been the primary care caregiver. Also made note of the fact the patient has polypharmacy and has multiple medications listed. Some of the medications have  multiple complex interactions. The patient is to bring her medications in a bag to the office for complete medication reconciliation with the nurse practitioner on the return visit. Discharge Exam: BP 140/46  Pulse 60  Temp(Src) 97.9 F (36.6 C) (Oral)  Resp 18  Ht 5\' 4"  (1.626 m)  Wt 96.48 kg (212 lb 11.2 oz)  BMI 36.51 kg/m2  SpO2 96%  Filed Vitals:   11/25/11 1432 11/25/11 2051 11/25/11 2126 11/26/11 0632  BP:   140/62 140/46  Pulse:   58 60  Temp:   98 F (36.7 C) 97.9 F (36.6 C)  TempSrc:   Oral Oral  Resp:   20 18  Height:      Weight:    96.48 kg (212 lb 11.2 oz)  SpO2: 96% 97% 94% 96%    NWG:NFAOZ in no distress, flat affect  ENT: No lesions,  mouth clear,  oropharynx clear, no postnasal drip  Neck: No JVD, no TMG, no carotid bruits  Lungs: No use of accessory muscles, no dullness to percussion, clear without rales or rhonchi  Cardiovascular: RRR, heart sounds normal, no murmur or gallops, no peripheral edema  Abdomen: soft and NT, no HSM,  BS normal  Musculoskeletal: No deformities, no cyanosis or clubbing  Neuro: alert, non focal  Skin: Warm, no lesions or rashes     Labs at discharge Lab Results  Component Value Date   CREATININE 0.80 11/25/2011   BUN 16 11/25/2011   NA 138 11/25/2011  K 3.5 11/25/2011   CL 105 11/25/2011   CO2 25 11/25/2011   Lab Results  Component Value Date   WBC 2.8* 11/26/2011   HGB 9.0* 11/26/2011   HCT 27.7* 11/26/2011   MCV 97.5 11/26/2011   PLT 85* 11/26/2011   Lab Results  Component Value Date   ALT 29 11/25/2011   AST 29 11/25/2011   ALKPHOS 97 11/25/2011   BILITOT 0.8 11/25/2011   Lab Results  Component Value Date   INR 1.1 RATIO* 04/18/2008   INR 1.1 03/19/2008   INR 1.1 11/29/2007    Current radiology studies No results found.  Disposition:    Discharge Orders    Future Appointments: Provider: Department: Dept Phone: Center:   02/24/2012 3:30 PM Michele Mcalpine, MD [facility] [phone] None     Future Orders Please  Complete By Expires   For home use only DME oxygen      Comments:   Stay on oxygen 2Liter rest 3Liter exertion,  Use [facility]   Discharge patient      Comments:   Discharge to home with [facility]  [facility]     Medication List  As of 11/26/2011  8:17 AM   STOP taking these medications         NUVIGIL 150 MG tablet         TAKE these medications         ADVAIR DISKUS 250-50 MCG/DOSE Aepb   Generic drug: Fluticasone-Salmeterol   Inhale 1 puff into the lungs every 12 (twelve) hours.      ALPRAZolam 1 MG tablet   Commonly known as: XANAX   Take 1 mg by mouth at bedtime as needed.      atenolol 50 MG tablet   Commonly known as: TENORMIN   Take 50 mg by mouth daily.      azithromycin 250 MG tablet   Commonly known as: ZITHROMAX   Take one daily      b complex vitamins tablet   Take 1 tablet by mouth daily.      buPROPion 150 MG 24 hr tablet   Commonly known as: WELLBUTRIN XL   Take 150 mg by mouth daily.      carbidopa-levodopa 25-100 MG per tablet   Commonly known as: SINEMET CR   Take 1 tablet by mouth 2 (two) times daily. 1 at lunch and 1 at dinner      CENTRUM SILVER PO   Take 1 tablet by mouth daily.      clotrimazole 1 % cream   Commonly known as: LOTRIMIN   Apply topically 2 (two) times daily.      desvenlafaxine 50 MG 24 hr tablet   Commonly known as: PRISTIQ   Take 50 mg by mouth daily.      furosemide 40 MG tablet   Commonly known as: LASIX   Take 40 mg by mouth daily.      glimepiride 4 MG tablet   Commonly known as: AMARYL   Take 4 mg by mouth daily before breakfast.      guaiFENesin 600 MG 12 hr tablet   Commonly known as: MUCINEX   Take 1,200 mg by mouth 2 (two) times daily.      insulin glargine 100 UNIT/ML injection   Commonly known as: LANTUS   Inject 30 Units into the skin daily.      metFORMIN 500 MG tablet   Commonly known as: GLUCOPHAGE   Take 1,000 mg by mouth 2 (  two) times daily with a meal.      omeprazole 20 MG capsule   Commonly known as:  PRILOSEC   Take 20 mg by mouth 2 (two) times daily.      potassium chloride SA 20 MEQ tablet   Commonly known as: K-DUR,KLOR-CON   Take 20 mEq by mouth daily.      predniSONE 20 MG tablet   Commonly known as: DELTASONE   Take 3 daily for 4 days,  2 daily for 4 days, 1 daily for 4 days then stop      PROAIR HFA 108 (90 BASE) MCG/ACT inhaler   Generic drug: albuterol   Inhale 2 puffs into the lungs every 6 (six) hours as needed.      promethazine 25 MG tablet   Commonly known as: PHENERGAN   Take 25 mg by mouth every 6 (six) hours as needed.      QUEtiapine 100 MG tablet   Commonly known as: SEROQUEL   Take 200 mg by mouth at bedtime.      RISPERDAL 3 MG tablet   Generic drug: risperiDONE   Take 1.5 mg by mouth at bedtime. Take 1/2 at bedtime      simvastatin 40 MG tablet   Commonly known as: ZOCOR   Take 40 mg by mouth every evening.      sitaGLIPtin 100 MG tablet   Commonly known as: JANUVIA   Take 100 mg by mouth every evening.      Vitamin D (Ergocalciferol) 50000 UNITS Caps   Commonly known as: DRISDOL   Take 50,000 Units by mouth. Take on friday           Follow-up Information    Follow up with PARRETT,TAMMY, NP. Schedule an appointment as soon as possible for a visit in 1 week. (Bring all medications in with you for the appointment, I will have the office call you to confirm the appointment)    Contact information:   [facility] [employer] [address] [place] [address] [phone]          Discharged Condition: fair  Physician Statement:   The Patient was personally examined, the discharge assessment and plan has been personally reviewed and I agree with ACNP Babcock's assessment and plan. > 30 minutes of time have been dedicated to discharge assessment, planning and discharge instructions.   Signed: Shan Levans 11/26/2011, 8:17 AM

## 2011-11-26 NOTE — Progress Notes (Signed)
DC IV, DC Tele, DC Home. Discharge instructions and home medications discussed with patient and patient's family member. Patient and family denied any questions or concerns at that time. Patient leaving unit via wheel chair and appears to be in no acute distress.

## 2011-12-02 ENCOUNTER — Ambulatory Visit (INDEPENDENT_AMBULATORY_CARE_PROVIDER_SITE_OTHER): Payer: Medicare Other | Admitting: Adult Health

## 2011-12-02 ENCOUNTER — Encounter: Payer: Self-pay | Admitting: Adult Health

## 2011-12-02 VITALS — BP 118/88 | HR 66 | Temp 97.9°F | Ht 64.0 in | Wt 205.4 lb

## 2011-12-02 DIAGNOSIS — J441 Chronic obstructive pulmonary disease with (acute) exacerbation: Secondary | ICD-10-CM

## 2011-12-02 NOTE — Patient Instructions (Signed)
Follow med calendar closely and bring to each visit.  Continue on current regimen.  Follow up Dr. Kriste Basque  In 1 month and As needed

## 2011-12-02 NOTE — Progress Notes (Signed)
Subjective:    Patient ID: [name], female    DOB: 01-12-1941, 71 y.o.   MRN: [id number]  HPI 71 y/o WF   ~  October 25, 2011:  43mo ROV & [name] is w/o complaints today; SEE 06/12/11 OV for prev note string...  We rechecked labs today (see below), no changes made...     COPD> on Home O2, Advair250, NEBS, Mucinex; remote smoker quit yrs ago; known obstructive & restrictive lung dis; she has lost 10# in the interval & feels as though her breathing is sl improved as a result; asked to continue the same meds for now.     HBP> on Aten50, Lasix40Bid, K20; BP= 120/72 & she has mult somatic complaints but no specific CP, palpit, ch in SOB or edema; continue low sodium 7 wt reduction.     CAD & AS/AI> known non-obstructive CAD; hx atypCP/ CWP; no angina & she remains way too sedentary; discussed risk factor reduction strategy & she has f/u Cards DrCooper soon.    Chol> on Simva40; not really on diet & we discussed this; last FLP 10/12 showed TChol 110, TG 116, HDL 34, LDL 53; continue same, needs exercise!    DM> on Lantus20, Metform1000Bid, Glimep4, Januvia100; needs much better diet & exercise program! Not checking BS at home; BS=276, A1c=6.8 (improved from prev) & Pharm indicates that she is filling pretty regularly...    Obesity> way too sedentary, ?on diet & she has finally lost a little wt (down 10# to 215# today); we discussed diet, exercise, wt reduction strategies...    {GERD, Angiodysplasia w/ chronic GI blood loss...    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 We will continue to check labs every 39mo & ROV in 43mo...    DJD/ FM/ ?osteopenia/  VitD defic> she has mult somatic complaints & takes Tylenol prn; on VitD 50K weekly...    RLS> on Requip4Qhs & Sinemet25/100Bid from DrDohmeier w/ fair control of symptoms...    Anxiety/ Depression/ Psyche> on RX per Psychiatry DrHejazi (we don't have notes from him) & she does not know her meds, didn't bring med bottles or med list!  As best we can tell she is on Nuvigil, Pristiq, Wellbutrin, Risperdal, Seroquel, Xanax> all from Psychiatry & she is reminded to bring all bottles to every visit...  LABS 3/13:  Chems- ok x BS=276 A1c=6.8;  CBC- Hg=11.2 Fe=71 WBC=4.2K, Plat=75K...  12/02/2011 Post Hospital follow up  Patient was admitted March 29 2 11/26/2011 for an acute COPD exacerbation. Patient presented with progressively worsening shortness of breath and cough. She was admitted, started on IV antibiotics, steroids, and aggressive pulmonary hygiene with nebulizer therapy. Patient responded gradually and was transitioned over to oral antibiotics and steroids. She does have a history of long-standing angiodysplasia of the intestine was slow. Blood loss, and iron deficiency anemia. Her hemoglobin stayed stable during her admission. She did have some hyperglycemia due to steroid use. Her Lantus dose was increased to 30 units daily. She is on multiple medications and has been recommended that we review all of her medications today. Chest x-ray showed chronic  changes without acute process.  Since discharge. Patient is reports that she is feeling better with decreased cough and congestion. Near her baseline. BS are going back down w/ no hypoglycemic episodes .   We reviewed all her medications and organized them into a medication calendar with patient education.           Problem List:  COPD (ICD-496) & BRONCHITIS, RECURRENT (ICD-491.9) - Ex-smoker w/ underlying COPD on home O2, ADVAIR 250Bid, ALBUTEROL (via nebs or MDI for Prn use), & MUCINEX 1-2Bid... she has combined obstructive AND restrictive disease  (based on her obesity) w/ diet (weight reduction) + exercise strongly recommended to the pt...  ~  CTChest 5/06 = neg. ~  baseline CXR w/ bilat LL scarring, NAD.... last CXR 3/09 = unchanged, NAD. ~  PFTs 3/09 showed FVC= 2.47 (83%), FEV1= 1.65 (69%), %1sec= 67, mid-flows= 28%pred. ~  CXR 4/12 showed bilat scarring, NAD... ~  PFT 10/12 showed FVC= 2.28 (77%), FEV1= 1.46 (64%), %1sec= 64, mid-flows= 31% pred.  HYPERTENSION (ICD-401.9) - on ATENOLOL 50mg /d,  LASIX 40mg Bid & K20/d...  ~  10/11:  BP=116/78 and she has mult somatic complaints but all are diminished compared to prev visits... denies bad HA's, CP, palpit, syncope, edema, etc... ~  4/12:  BP= 140/60 and she persists w/ mult somatic complaints... ~  10/12:  BP= 122/74 & she has mult somatic complaints; needs better low sodium, calorie restricted diet & wt reduction... ~  3/13:  BP= 120/72 & she is unchanged symptomatically...  CAD (ICD-414.00) - Hosp at [facility] 4/09 w/ CP... cath showed non-obstructive CAD... no CP, palpit, etc (but she is way too sedentary). ~  cath 4/09 by DrCooper showed normLMAIN, 20% midLAD, norm CIRC, 20-30% proxRCA, EF= 50-55%... ~  EKG 1/11 showed SBrady, rate54, ?septal infarct, NSSTTWA, NAD...  PALPITATIONS (ICD-785.1) - eval by DrKlein w/ 7 beats WT on monitor... he changed Lisinopril to BBlocker (currently ATENOLOL 50mg /d) and improved... ~  she saw DrCopper for Cards f/u 1/11- palpit well controlled on Atenolol; hx non-obstructive CAD & good LVF; no change in Rx.  AORTIC STENOSIS (ICD-424.1) - she has mild Ao Valve disease w/ AS/ AI... followed by DrCooper. ~  2DEcho 11/06 showed mild ca++ AoV w/ mild AS, norm LVF w/ EF=55-65% ~  repeat 2DEcho 1/09 showed similar mild ca++ AoV w/ mild AS, mild AI, no regional wall motion abn, norm LVf w/ EF= 60%... NOTE: est PAsys= 35... ~  repeat 2DEcho 1/10 showed norm LVF w/ EF= 55-60%, no regional wall motion abn, mild DD, mod calcif AoV & mild reduced leaflet  excursion & mild AI...  HYPERCHOLESTEROLEMIA (ICD-272.0) - on SIMVASTATIN 40mg /d... ~  FLP 6/09 showed TChol 107, TG 69, HDL 35, LDL 59 ~  FLP 3/10 showed TChol 128, TG 125, HDL 33, LDL 70 ~  FLP 12/10 showed TChol 98, TG 64, HDL 37, LDL 48... continue same. ~  FLP 10/11 showed TChol 124, TG 105, HDL 37, LDL 66 ~  FLP 4/12 showed TChol 122, TG 95, HDL 37, LDL 66 ~  FLP 10/12 on Simva40 showed TChol 110, TG 116, HDL 34, LDL 53  DM (ICD-250.00) - hx irreg control, not really on diet... on LANTUS 20u daily,  METFORMIN 500mg -2Bid,  GLIMEPIRIDE 4mg /d,  JANUVIA 100mg /d...  ~  labs 10/08 BS=198, HgA1C=7.2 ~  labs 1/09 showed BS=119, HgA1c=6.0 ~  labs 6/09 showed BS= 248, HgA1c= 7.0..[name] discussed need for starting insulin if she can't get on track and get wt down... ~  labs 9/09 showed BS= 236, HgA1c= 7.6.Marland KitchenMarland Kitchen rec- start Lantus 5u/d... grad increased to 10u. ~  labs 11/09 showed BS= 166, HgA1c= 6.0.Marland KitchenMarland Kitchen rec- great! keep same. ~  labs 3/10 showed BS= 169, A1c= 6.8 ~  labs 12/10 showed BS= 149, A1c= 5.8, Umicroalb= neg. ~  labs 10/11 showed BS= 215, A1c= 7.1.[name]. rec> better diet, get wt down. ~  Labs 4/12 showed BS= 172, A1c=5.8 ~  Labs 10/12 showed BS= 245, A1c= 8.1.Marland KitchenMarland Kitchen rec to take meds every day; incr Lantus dose 10==> 20u/d... ~  Labs 3/13 on Lantus20 + showed BS= 276, A1c= 6.8 (improved)...  MORBID OBESITY (ICD-278.01) - not really on diet or exercise program... ~  weight 6/09 = 223# ~  weight 11/09 = 228# ~  weight 3/10 = 225# ~  weight 6/10 = 217# ~  weight 9/10 = 213# ~  weight 12/10 = 224# ~  weight 2/11 = 216# ~  weight 10/11 = 230# ~  Weight 4/12 = 222# ~  Weight 10/12 = 225# ~  Weight 3/13 = 215#...  great job, keep it up...  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  ~  EGD  2/06 showed angiodysplasia & mult  gastric pseudopolyps, watermelon stomach improved from 2003. ~  recent f/u EGD 9/09 showed chr gastritis, no watermelon stomach & improved from prev... ~  recurrent anemia 12/10 w/ f/u EGD 2/11 showing gastric antral avm's- s/p argon laser ablation, no varicies etc... 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  DIVERTICULOSIS OF COLON (ICD-562.10) & POLYP, COLON (ICD-211.3) -  ~  last colonoscopy 10/08 w/ adenomatous polyp removed, and angiodysplasia without hemorrhage ~  CT Abd 10/08 showed Cirrhosis, borderline splenomeg, sl pancreatic atrophy & duct dil, sm umbil hernia, DJD sp, NAD...  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 ~  10/11:  presents w/ dysuria & UTI Rx w/ Cipro... ~  12/11:  She had f/u DrGrapey...  OSTEOARTHRITIS (ICD-715.90)  FIBROMYALGIA (ICD-729.1) - she persists w/ mult somatic complaints...  ? of OSTEOPOROSIS (ICD-733.00) - she had normal BMD in 2001 at [facility]..  RESTLESS LEGS SYNDROME (ICD-333.94) - she prev weaned off the Requip... ~  9/10: now c/o recurrent restless leg symptoms... restart REQUIP 2mg  & titrate up. ~  12/10: persistant symptoms on 2mg Qhs... rec to incr to 4mg ... ~  2/11: she reports resolution of RLS on 4mg  Requip Qhs, resting well now. ~  12/11:  Recurrent RLS symptoms despite the Requip & seen by DrDohmeier w/ addition of Sinemet 25/100 Bid... ~  2012:  Stable on Requip4mg  & Sinemet  25/100 Bid...  DYSTHYMIA (ICD-300.4) - Hx depression w/ psychotic features and hosp 2/09 at [facility] w/ delusions of pedunculosis... she was prev treated w/ RISPERDAL, Celexa, XANAX &  Ambien by Dr. Bartholomew Crews & DrSuttenfield for counselling... Adm to [facility] in May09 by DrBarbSmith w/ depression and meds adjusted (see DC summary)... now followed by [facility] and [name] at [facility]- she was re-admitted 10/09 and meds changed to RISPERDAL, PRISTIQUE, BUPROPION, NUVIGIL, & XANAX...  husb indicates that she is much better at present & continues outpt Rx... ~  MRI/ MRA Brain 2/08 showed sm vessel dis & atherosclerotic changes in post circ... ~  9/10: pt indicates that she is off the Trazodone... DrHejazi added Wellbutrin ?for her RLS? ~  2011:  pt continues regular f/u w/ Psychiatry on numerous meds w/ freq med adjustments; she is requested to get notes from [facility] to our attention & bring all med bottles to her OVs for review... ~  2012:  We have not received any notes from Psyche; pt has not complied w/ our request for med bottles to be brought to each OV...  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 ~  labs 04/18/08 by DrBrodie showed Hg= 10.4, Fe= 52... she ordered 1,200mg  IV Iron- given 04/26/08. ~  labs 05/19/08 showed Hg= 11.3, Fe= 117... ~  labs 11/09 showed Hg= 12.0.Marland KitchenMarland Kitchen ~  labs 3/10 showed Hg= 12.3, Fe= 79 ~  labs 6/10 showed Hg= 11.5, Fe= 73 ~  labs 12/10 showed Hg= 7.2, Fe= 28 (6%sat)... given [facility] & IV Fe per DrBrodie in Jan2012. ~  labs 2/11 showed Hg= 11.8, Fe= 95... pt indicates that DrBrodie will be following labs going forward... ~  labs 6/11 showed Hg= 11.2 ~  labs 9/11 showed Hg= 11.1, Fe= 106 ~  Labs 4/12 showed Hg=11.0, Fe=118 ~  Labs 10/12 showed Hg=9.5, Fe=35 (7%sat)... We will arrange for outpt  infusion 1000mg  Fe... ~  Labs 12/12 showed Hg=11.4, Fe=92 (26%sat) ~  Labs 3/13 showed Hg=11.2, Fe=71 (17%sat)   Past Surgical History  Procedure Date  . Hemorroidectomy   . Appendectomy   . Vaginal hysterectomy     Outpatient Encounter Prescriptions as of 12/02/2011  Medication Sig Dispense Refill  . albuterol (PROAIR HFA) 108 (90 BASE) MCG/ACT inhaler Inhale 2 puffs into the lungs every 6 (six) hours as needed.        . ALPRAZolam (XANAX) 1 MG tablet Take 1 mg by mouth at bedtime as needed.       [name] atenolol (TENORMIN) 50 MG tablet Take 50 mg by mouth daily.      [name] azithromycin (ZITHROMAX) 250 MG tablet Take one daily  6 each  0  . b complex vitamins tablet Take 1 tablet by mouth daily.       [name] buPROPion (WELLBUTRIN XL) 150 MG 24 hr tablet Take 150 mg by mouth daily.      . carbidopa-levodopa (SINEMET CR) 25-100 MG per tablet Take 1 tablet by mouth 2 (two) times daily. 1 at lunch and 1 at dinner      . clotrimazole (LOTRIMIN) 1 % cream Apply topically 2 (two) times daily.        [name] desvenlafaxine (PRISTIQ) 50 MG 24 hr tablet Take 50 mg by mouth daily.       . Fluticasone-Salmeterol (ADVAIR DISKUS) 250-50 MCG/DOSE AEPB Inhale 1 puff into the lungs every 12 (twelve) hours.       . furosemide (LASIX) 40 MG tablet Take 40 mg by mouth daily.      [name] glimepiride (AMARYL) 4 MG tablet  Take 4 mg by mouth daily before breakfast.      . guaiFENesin (MUCINEX) 600 MG 12 hr tablet Take 1,200 mg by mouth 2 (two) times daily.       . insulin glargine (LANTUS SOLOSTAR) 100 UNIT/ML injection Inject 30 Units into the skin daily.  10 mL  6  . metFORMIN (GLUCOPHAGE) 500 MG tablet Take 1,000 mg by mouth 2 (two) times daily with a meal.      . Multiple Vitamins-Minerals (CENTRUM SILVER PO) Take 1 tablet by mouth daily.       [name] omeprazole (PRILOSEC) 20 MG capsule Take 20 mg by mouth 2 (two) times daily.      . potassium chloride SA (K-DUR,KLOR-CON) 20 MEQ tablet Take 20 mEq by mouth daily.      . predniSONE  (DELTASONE) 20 MG tablet Take 3 daily for 4 days,  2 daily for 4 days, 1 daily for 4 days then stop  24 tablet  0  . promethazine (PHENERGAN) 25 MG tablet Take 25 mg by mouth every 6 (six) hours as needed.        [name] QUEtiapine (SEROQUEL) 100 MG tablet Take 200 mg by mouth at bedtime.       . risperiDONE (RISPERDAL) 3 MG tablet Take 1.5 mg by mouth at bedtime. Take 1/2 at bedtime      . simvastatin (ZOCOR) 40 MG tablet Take 40 mg by mouth every evening.      . sitaGLIPtin (JANUVIA) 100 MG tablet Take 100 mg by mouth every evening.      . Vitamin D, Ergocalciferol, (DRISDOL) 50000 UNITS CAPS Take 50,000 Units by mouth. Take on friday        Allergies  Allergen Reactions  . Aspirin Other (See Comments)    Causes nosebleeds  . Penicillins Itching  . Theophylline Nausea And Vomiting    Current Medications, Allergies, Past Medical History, Past Surgical History, Family History, and Social History were reviewed in [employer] record.    Review of Systems Constitutional:   No  weight loss, night sweats,  Fevers, chills, fatigue, or  lassitude.  HEENT:   No headaches,  Difficulty swallowing,  Tooth/dental problems, or  Sore throat,                No sneezing, itching, ear ache, nasal congestion, post nasal drip,   CV:  No chest pain,  Orthopnea, PND, swelling in lower extremities, anasarca, dizziness, palpitations, syncope.   GI  No heartburn, indigestion, abdominal pain, nausea, vomiting, diarrhea, change in bowel habits, loss of appetite, bloody stools.   Resp: No shortness of breath with exertion or at rest.  No excess mucus, no productive cough,  No non-productive cough,  No coughing up of blood.  No change in color of mucus.  No wheezing.  No chest wall deformity  Skin: no rash or lesions.  GU: no dysuria, change in color of urine, no urgency or frequency.  No flank pain, no hematuria   MS:  No joint pain or swelling.  No decreased range of motion.  No back  pain.  Psych:  No change in mood or affect. No depression or anxiety.  No memory loss.              Objective:   Physical Exam     WD, Obese, 71 y/o WF in NAD... she is chr ill appearing... GENERAL:  Alert & oriented x 3... HEENT:  [place]/AT,  EACs-clear, TMs-wnl, NOSE-clear, THROAT-clear  w/ dry MMs... NECK:  Supple w/ fairROM; no JVD; normal carotid impulses w/o bruits; no thyromegaly or nodules palpated; no lymphadenopathy. CHEST:  Clear to P & A; without wheezes/ rales/ or rhonchi heard... HEART:  Regular Rhythm;  gr 1/6 SEM without rubs or gallops... ABDOMEN:  Obese, soft & nontender w/ panniculus; normal bowel sounds; no organomegaly or masses detected. EXT: without deformities, mod arthritic changes; no varicose veins/ +venous insuffic/ tr edema. NEURO:    no focal neuro deficits... DERM:  No lesions noted; no rash, pale complexion.   Assessment & Plan:

## 2011-12-02 NOTE — Assessment & Plan Note (Addendum)
Recent flare now resolved   Plan:  Continue on current regimen.  Patient's medications were reviewed today and patient education was given. Computerized medication calendar was adjusted/completed

## 2011-12-04 ENCOUNTER — Telehealth: Payer: Self-pay | Admitting: Pulmonary Disease

## 2011-12-04 NOTE — Telephone Encounter (Signed)
Per SN---ok for dietary consult to help pt .  thanks

## 2011-12-04 NOTE — Telephone Encounter (Signed)
Called spoke with Waynetta Sandy, advised SN okayed for dietary consult.  Beth verbalized her understanding.  Nothing further needed, will sign off.

## 2011-12-04 NOTE — Telephone Encounter (Signed)
Called spoke with [facility] who sees patient for COPD and diabetes.  She reports that pt has a hard time with her diet and her hand tremors make it difficult to prepare meals while her husband is at work during the day.  [name] is requesting a verbal order for a dietician "phone consult" for patient give patient some assistance with healthy and easy options while home alone during the day.  Dr Kriste Basque please advise, thanks.

## 2011-12-05 NOTE — Progress Notes (Signed)
Addended by: Boone Master E on: 12/05/2011 10:44 AM   Modules accepted: Orders

## 2011-12-06 ENCOUNTER — Telehealth: Payer: Self-pay | Admitting: Pulmonary Disease

## 2011-12-06 NOTE — Telephone Encounter (Signed)
?   Any sign of Infections?  Increase Lantus to 35 units Twice daily  --please adjust MAR If does not improve or develops other symptoms will need further evalution  Please contact office for sooner follow up if symptoms do not improve or worsen or seek emergency care '

## 2011-12-06 NOTE — Telephone Encounter (Signed)
Spoke with pt's [facility] nurse Beth. She states that the pt has been c/o dizziness since this am. Her fasting blood glucose yesterday was 271 and was 217 earlier today. She states that pt has had no changes in meds or her diet- still taking amaryl 4 mg qd, lantus 30 units bid, and januvia 100 mg daily. She states that her blood sugars are always well controlled so unsure what to do. Will forward to TP since SN out of the office today. Please advise, thanks!

## 2011-12-06 NOTE — Telephone Encounter (Signed)
Increase Lantus 35 units in am only  Please contact office for sooner follow up if symptoms do not improve or worsen or seek emergency care  Ov if not improving.

## 2011-12-06 NOTE — Telephone Encounter (Signed)
Spoke with [employer]. No sign of any infection at all.  Notified her of recs per TP. She states that the pt is actually taking 30 units of lantus just in the am.  Sorry for the confusion. Please advise new recs thanks!

## 2011-12-06 NOTE — Telephone Encounter (Signed)
Called and spoke with beth from [facility]--she is aware of TP recs to increase the insulin to 35 units in the am only.  She will pass this along to the pt and the pt is aware to call if anything further needed or she will need ov if this is not helping.  Waynetta Sandy will inform the pt

## 2011-12-11 ENCOUNTER — Ambulatory Visit: Payer: Medicare Other | Admitting: Pulmonary Disease

## 2011-12-16 ENCOUNTER — Other Ambulatory Visit: Payer: Self-pay | Admitting: Internal Medicine

## 2011-12-17 ENCOUNTER — Other Ambulatory Visit: Payer: Self-pay | Admitting: Pulmonary Disease

## 2011-12-20 ENCOUNTER — Encounter: Payer: Self-pay | Admitting: Pulmonary Disease

## 2011-12-25 ENCOUNTER — Telehealth: Payer: Self-pay | Admitting: Pulmonary Disease

## 2011-12-25 NOTE — Telephone Encounter (Signed)
Pt informed that samples of Advair and Januvia were left at front desk for pick up.

## 2012-01-13 ENCOUNTER — Encounter: Payer: Self-pay | Admitting: Pulmonary Disease

## 2012-01-13 ENCOUNTER — Ambulatory Visit (INDEPENDENT_AMBULATORY_CARE_PROVIDER_SITE_OTHER): Payer: Medicare Other | Admitting: Pulmonary Disease

## 2012-01-13 VITALS — BP 122/78 | HR 62 | Temp 99.9°F | Ht 64.0 in | Wt 210.2 lb

## 2012-01-13 DIAGNOSIS — J441 Chronic obstructive pulmonary disease with (acute) exacerbation: Secondary | ICD-10-CM

## 2012-01-13 DIAGNOSIS — I2510 Atherosclerotic heart disease of native coronary artery without angina pectoris: Secondary | ICD-10-CM

## 2012-01-13 DIAGNOSIS — F419 Anxiety disorder, unspecified: Secondary | ICD-10-CM

## 2012-01-13 DIAGNOSIS — E119 Type 2 diabetes mellitus without complications: Secondary | ICD-10-CM

## 2012-01-13 DIAGNOSIS — I10 Essential (primary) hypertension: Secondary | ICD-10-CM

## 2012-01-13 DIAGNOSIS — E7800 Pure hypercholesterolemia, unspecified: Secondary | ICD-10-CM

## 2012-01-13 DIAGNOSIS — K5730 Diverticulosis of large intestine without perforation or abscess without bleeding: Secondary | ICD-10-CM

## 2012-01-13 DIAGNOSIS — F333 Major depressive disorder, recurrent, severe with psychotic symptoms: Secondary | ICD-10-CM

## 2012-01-13 DIAGNOSIS — G2581 Restless legs syndrome: Secondary | ICD-10-CM

## 2012-01-13 DIAGNOSIS — J42 Unspecified chronic bronchitis: Secondary | ICD-10-CM

## 2012-01-13 DIAGNOSIS — K5520 Angiodysplasia of colon without hemorrhage: Secondary | ICD-10-CM

## 2012-01-13 DIAGNOSIS — K219 Gastro-esophageal reflux disease without esophagitis: Secondary | ICD-10-CM

## 2012-01-13 DIAGNOSIS — K754 Autoimmune hepatitis: Secondary | ICD-10-CM

## 2012-01-13 DIAGNOSIS — D500 Iron deficiency anemia secondary to blood loss (chronic): Secondary | ICD-10-CM

## 2012-01-13 DIAGNOSIS — D126 Benign neoplasm of colon, unspecified: Secondary | ICD-10-CM

## 2012-01-13 DIAGNOSIS — I359 Nonrheumatic aortic valve disorder, unspecified: Secondary | ICD-10-CM

## 2012-01-13 MED ORDER — PANTOPRAZOLE SODIUM 40 MG PO TBEC: 40.0000 mg | DELAYED_RELEASE_TABLET | Freq: Every day | ORAL | Status: DC

## 2012-01-13 NOTE — Patient Instructions (Signed)
Today we updated your med list in our EPIC system...    Continue your current medications the same...    We decided to write a new prescription for PANTOPRAZOLE 40mg  to replace the Omeprazole 20mg  (it is the same cost & a more effective acid suppressor).  We will do your follow up blood work & call you w/ the results...    We can then decide on an increased dose of the Lantus, or whether you need iron or not...  Call for any questions...  Let's plan a follow up visit in 6-8 weeks.Marland KitchenMarland Kitchen

## 2012-01-13 NOTE — Progress Notes (Signed)
Subjective:    Patient ID: [name], female    DOB: 1940/12/22, 71 y.o.   MRN: [id number]  HPI 71 y/o WF here for a follow up visit... she has mult med problems as noted below>   ~  October 25, 2011:  69mo ROV & [name] is w/o complaints today; SEE 06/12/11 OV for prev note string...  We rechecked labs today (see below), no changes made...    COPD> on Home O2, Advair250, NEBS, Mucinex; remote smoker quit yrs ago; known obstructive & restrictive lung dis; she has lost 10# in the interval & feels as though her breathing is sl improved as a result; asked to continue the same meds for now.     HBP> on Aten50, Lasix40Bid, K20; BP= 120/72 & she has mult somatic complaints but no specific CP, palpit, ch in SOB or edema; continue low sodium 7 wt reduction.     CAD & AS/AI> known non-obstructive CAD; hx atypCP/ CWP; no angina & she remains way too sedentary; discussed risk factor reduction strategy & she has f/u Cards DrCooper soon.    Chol> on Simva40; not really on diet & we discussed this; last FLP 10/12 showed TChol 110, TG 116, HDL 34, LDL 53; continue same, needs exercise!    DM> on Lantus20, Metform1000Bid, Glimep4, Januvia100; needs much better diet & exercise program! Not checking BS at home; BS=276, A1c=6.8 (improved from prev) & Pharm indicates that she is filling pretty regularly...    Obesity> way too sedentary, ?on diet & she has finally lost a little wt (down 10# to 215# today); we discussed diet, exercise, wt reduction strategies...    {GERD, Angiodysplasia w/ chronic GI blood loss...    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 We will continue  to check labs every 49mo & ROV in 69mo...    DJD/ FM/ ?osteopenia/ VitD defic> she has mult somatic complaints & takes Tylenol prn; on VitD 50K weekly...    RLS> on Requip4Qhs & Sinemet25/100Bid from DrDohmeier w/ fair control of symptoms...    Anxiety/ Depression/ Psyche> on RX per Psychiatry DrHejazi (we don't have notes from him) & she does not know her meds, didn't bring med bottles or med list!  As best we can tell she is on Nuvigil, Pristiq, Wellbutrin, Risperdal, Seroquel, Xanax> all from Psychiatry & she is reminded to bring all bottles to every visit... LABS 3/13:  Chems- ok x BS=276 A1c=6.8;  CBC- Hg=11.2 Fe=71 WBC=4.2K, Plat=75K...  ~  Jan 13, 2012:  3mo ROV & post hosp visit> Hosp 3/29 - 11/26/11 w/ COPD exac & mult chronic medical issues; treated w/ Pred & Zithromax & improved;  disch on Oxygen 2L/min, Advair250Bid, Pred taper, Proair prn;  Now improved & approaching baseline, she has completed the Pred taper but noted incr SOB, cough, & yellow sputum==> she wants additional antibiotic rx & we wrote for Avelox x7d...  She has mult additional somatic complaints> dizzy, abd discomfort, RLS, etc; overall they note fewer good days and more bad days; he daugh has been in the hosp & it wears her out to go  for a visit...  We reviewed her prob list, meds, xrays and labs> see below>>     Hx DM on Lantus plus Metformin500mg - 2Bid, Glimepiride 4mg  Qam, & Januvia 100mg /d> BS=191, A1c=6.9 and she will maintain her current regimen + diet & try to incr exercise...    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 LABS 5/13:  FLP- at goals on Simva40 x HDL=32;  Chems- ok x BS=191 A1c=6.9 LFTs=sl elev;  CBC- pancytopenic w/ Hg=9.8 Fe=53;  TSH=3.50            Problem List:  COPD (ICD-496) & BRONCHITIS, RECURRENT (ICD-491.9) - Ex-smoker w/  underlying COPD on home O2, ADVAIR 250Bid, ALBUTEROL (via nebs or MDI for Prn use), & MUCINEX 1-2Bid... she has combined obstructive AND restrictive disease (based on her obesity) w/ diet (weight reduction) + exercise strongly recommended to the pt...  ~  CTChest 5/06 = neg. ~  baseline CXR w/ bilat LL scarring, NAD.... last CXR 3/09 = unchanged, NAD. ~  PFTs 3/09 showed FVC= 2.47 (83%), FEV1= 1.65 (69%), %1sec= 67, mid-flows= 28%pred. ~  CXR 4/12 showed bilat scarring, NAD... ~  PFT 10/12 showed FVC= 2.28 (77%), FEV1= 1.46 (64%), %1sec= 64, mid-flows= 31% pred.  HYPERTENSION (ICD-401.9) - on ATENOLOL 50mg /d,  LASIX 40mg Bid & K20/d...  ~  10/11:  BP=116/78 and she has mult somatic complaints but all are diminished compared to prev visits... denies bad HA's, CP, palpit, syncope, edema, etc... ~  4/12:  BP= 140/60 and she persists w/ mult somatic complaints... ~  10/12:  BP= 122/74 & she has mult somatic complaints; needs better low sodium, calorie restricted diet & wt reduction... ~  3/13:  BP= 120/72 & she is unchanged symptomatically;  EKG 3/13 showed NSR, rate68, LAD, late transition, NSSTTWA...  CAD (ICD-414.00) - Hosp at [facility] 4/09 w/ CP... cath showed non-obstructive CAD... no CP, palpit, etc (but she is way too sedentary). ~  cath 4/09 by DrCooper showed normLMAIN, 20% midLAD, norm CIRC, 20-30% proxRCA, EF= 50-55%... ~  EKG 1/11 showed SBrady, rate54, ?septal infarct, NSSTTWA, NAD...  PALPITATIONS (ICD-785.1) - eval by DrKlein w/ 7 beats WT on monitor... he changed Lisinopril to BBlocker (currently ATENOLOL 50mg /d) and improved... ~  she saw DrCopper for Cards f/u 1/11- palpit well controlled on Atenolol; hx non-obstructive CAD & good LVF; no change in Rx.  AORTIC STENOSIS (ICD-424.1) - she has mild Ao Valve disease w/ AS/ AI... followed by DrCooper. ~  2DEcho 11/06 showed mild ca++ AoV w/ mild AS, norm LVF w/ EF=55-65% ~  repeat 2DEcho 1/09 showed similar mild ca++ AoV w/ mild AS,  mild AI, no regional wall motion abn, norm LVf w/ EF= 60%... NOTE: est PAsys= 35... ~  repeat 2DEcho 1/10 showed norm LVF w/ EF= 55-60%, no regional wall motion abn, mild DD, mod calcif AoV & mild reduced leaflet excursion & mild AI...  HYPERCHOLESTEROLEMIA (ICD-272.0) - on SIMVASTATIN 40mg /d... ~  FLP 6/09 showed TChol 107, TG 69, HDL 35, LDL 59 ~  FLP 3/10 showed TChol 128, TG 125, HDL 33, LDL 70 ~  FLP 12/10 showed TChol 98, TG 64, HDL 37, LDL 48... continue same. ~  FLP 10/11 showed TChol 124, TG 105, HDL 37, LDL 66 ~  FLP 4/12 showed TChol 122, TG 95, HDL 37, LDL 66 ~  FLP 10/12 on Simva40  showed TChol 110, TG 116, HDL 34, LDL 53 ~  FLP 5/13 on Simva40 showed TChol 94, TG 130, HDL 32, LDL 36  DM (ICD-250.00) - hx irreg control, not really on diet... on LANTUS 20u daily,  METFORMIN 500mg -2Bid,  GLIMEPIRIDE 4mg /d,  JANUVIA 100mg /d...  ~  labs 10/08 BS=198, HgA1C=7.2 ~  labs 1/09 showed BS=119, HgA1c=6.0 ~  labs 6/09 showed BS= 248, HgA1c= 7.0..[name] discussed need for starting insulin if she can't get on track and get wt down... ~  labs 9/09 showed BS= 236, HgA1c= 7.6.Marland KitchenMarland Kitchen rec- start Lantus 5u/d... grad increased to 10u. ~  labs 11/09 showed BS= 166, HgA1c= 6.0.Marland KitchenMarland Kitchen rec- great! keep same. ~  labs 3/10 showed BS= 169, A1c= 6.8 ~  labs 12/10 showed BS= 149, A1c= 5.8, Umicroalb= neg. ~  labs 10/11 showed BS= 215, A1c= 7.1.[name]. rec> better diet, get wt down. ~  Labs 4/12 showed BS= 172, A1c=5.8 ~  Labs 10/12 showed BS= 245, A1c= 8.1.Marland KitchenMarland Kitchen rec to take meds every day; incr Lantus dose 10==> 20u/d... ~  Labs 3/13 on Lantus20+84meds showed BS= 276, A1c= 6.8 (improved)... ~  4/13:  No diabetic retinopathy per optometrist at [facility]... ~  Labs 5/13 on Lantus20+28meds showed BS= 191, A1c= 6.9  MORBID OBESITY (ICD-278.01) - not really on diet or exercise program... ~  weight 6/09 = 223# ~  weight 11/09 = 228# ~  weight 3/10 = 225# ~  weight 6/10 = 217# ~  weight 9/10 = 213# ~  weight 12/10 =  224# ~  weight 2/11 = 216# ~  weight 10/11 = 230# ~  Weight 4/12 = 222# ~  Weight 10/12 = 225# ~  Weight 3/13 = 215#...  great job, keep it up... 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  ~  EGD  2/06 showed angiodysplasia & mult gastric pseudopolyps, watermelon stomach improved from 2003. ~  recent f/u EGD 9/09 showed chr gastritis, no watermelon stomach & improved from prev... ~  recurrent anemia 12/10 w/ f/u EGD 2/11 showing gastric antral avm's- s/p argon laser ablation, no varicies etc... 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 ~  5/13:  She is not on PPI and c/o some abd discomfort- rec starting PROTONIX 40mg /d...  DIVERTICULOSIS OF COLON (ICD-562.10) & POLYP, COLON (ICD-211.3) -  ~  last colonoscopy 10/08 w/ adenomatous polyp removed, and angiodysplasia without hemorrhage ~  CT Abd 10/08 showed Cirrhosis, borderline splenomeg, sl pancreatic atrophy & duct dil, sm umbil hernia, DJD sp, NAD...  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 ~  10/11:  presents w/ dysuria & UTI Rx w/ Cipro... ~  12/11:  She had f/u DrGrapey.Marland KitchenMarland Kitchen  OSTEOARTHRITIS (ICD-715.90)  FIBROMYALGIA (ICD-729.1) - she  persists w/ mult somatic complaints...  ? of OSTEOPOROSIS (ICD-733.00) - she had normal BMD in 2001 at [facility]..  RESTLESS LEGS SYNDROME (ICD-333.94) - she prev weaned off the Requip... ~  9/10: now c/o recurrent restless leg symptoms... restart REQUIP 2mg  & titrate up. ~  12/10: persistant symptoms on 2mg Qhs... rec to incr to 4mg ... ~  2/11: she reports resolution of RLS on 4mg  Requip Qhs, resting well now. ~  12/11:  Recurrent RLS symptoms despite the Requip & seen by DrDohmeier w/ addition of Sinemet 25/100 Bid... ~  2012:  Stable on Requip4mg  & Sinemet 25/100 Bid...  DYSTHYMIA (ICD-300.4) - Hx depression w/ psychotic features and hosp 2/09 at [facility] w/ delusions of pedunculosis... she was prev treated w/ RISPERDAL, Celexa, XANAX & Ambien by Dr. Bartholomew Crews & DrSuttenfield for counselling... Adm to [facility] in May09 by DrBarbSmith w/ depression and meds adjusted (see DC summary)... now followed by [name] and [name] at [facility]- she was re-admitted 10/09 and meds changed to [facility], PRISTIQUE, BUPROPION, & XANAX (Nuvigil stopped 4/13 hosp)...  husb indicates that she is much better at present & continues outpt Rx... ~  MRI/ MRA Brain 2/08 showed sm vessel dis & atherosclerotic changes in post circ... ~  9/10: pt indicates that she is off the Trazodone... DrHejazi added Wellbutrin ?for her RLS? ~  2011:  pt continues regular f/u w/ Psychiatry on numerous meds w/ freq med adjustments; she is requested to get notes from [facility] to our attention & bring all med bottles to her OVs for review... ~  2012:  We have not received any notes from Psyche; pt has not complied w/ our request for med bottles to be brought to each OV...  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 ~  labs 04/18/08 by DrBrodie showed Hg= 10.4,  Fe= 52... she ordered 1,200mg  IV Iron- given 04/26/08. ~  labs 05/19/08 showed Hg= 11.3, Fe= 117... ~  labs 11/09 showed Hg= 12.0.Marland KitchenMarland Kitchen ~  labs 3/10 showed Hg= 12.3, Fe= 79 ~  labs 6/10 showed Hg= 11.5, Fe= 73 ~  labs 12/10 showed Hg= 7.2, Fe= 28 (6%sat)... given [facility] & IV Fe per DrBrodie in Jan2012. ~  labs 2/11 showed Hg= 11.8, Fe= 95... pt indicates that DrBrodie will be following labs going forward... ~  labs 6/11 showed Hg= 11.2 ~  labs 9/11 showed Hg= 11.1, Fe= 106 ~  Labs 4/12 showed Hg=11.0, Fe=118 ~  Labs 10/12 showed Hg=9.5, Fe=35 (7%sat)... We will arrange for outpt infusion 1000mg  Fe... ~  Labs 12/12 showed Hg=11.4, Fe=92 (26%sat) ~  Labs 3/13 showed Hg=11.2, Fe=71 (17%sat) ~  Labs 5/13 showed Hg=9.8, Fe=53 (13%sat)   Past Surgical History  Procedure Date  . Hemorroidectomy   . Appendectomy   . Vaginal hysterectomy     Outpatient Encounter Prescriptions as of 01/13/2012  Medication Sig Dispense Refill  . albuterol (PROAIR HFA) 108 (90 BASE) MCG/ACT inhaler Inhale 2 puffs into the lungs every 6 (six) hours as needed.        . ALPRAZolam (XANAX) 1 MG tablet Take 1 mg by mouth at bedtime.       . Armodafinil (NUVIGIL) 150 MG tablet Take 150 mg by mouth daily.      [name] atenolol (TENORMIN) 50 MG tablet Take 50 mg by mouth daily.      [name]  b complex vitamins tablet Take 1 tablet by mouth daily.       . B-D ULTRAFINE III SHORT PEN 31G X 8 MM MISC USE AS DIRECTED  100 each  6  . buPROPion (WELLBUTRIN XL) 150 MG 24 hr tablet Take 150 mg by mouth daily.      . carbidopa-levodopa (SINEMET CR) 25-100 MG per tablet Take 1 tablet by mouth 2 (two) times daily. 1 at lunch and 1 at dinner      . desvenlafaxine (PRISTIQ) 100 MG 24 hr tablet Take 100 mg by mouth daily.      [name] dextromethorphan-guaiFENesin (MUCINEX DM) 30-600 MG per 12 hr tablet Take 1-2 tablets by mouth every 12 (twelve) hours as needed.      . Fluticasone-Salmeterol (ADVAIR DISKUS) 250-50 MCG/DOSE AEPB Inhale 1 puff into the lungs  every 12 (twelve) hours.       . furosemide (LASIX) 40 MG tablet Take 40 mg by mouth daily.      [name] glimepiride (AMARYL) 4 MG tablet Take 4 mg by mouth daily before breakfast.      . insulin glargine (LANTUS) 100 UNIT/ML injection Inject 35 Units into the skin daily.      . metFORMIN (GLUCOPHAGE) 500 MG tablet Take 1,000 mg by mouth 2 (two) times daily with a meal.      . Multiple Vitamins-Minerals (CENTRUM SILVER PO) Take 1 tablet by mouth daily.       [name] omeprazole (PRILOSEC) 20 MG capsule Take 20 mg by mouth daily.       . potassium chloride SA (K-DUR,KLOR-CON) 20 MEQ tablet Take 20 mEq by mouth daily.      . promethazine (PHENERGAN) 25 MG tablet Take 25 mg by mouth every 6 (six) hours as needed.        [name] QUEtiapine (SEROQUEL) 100 MG tablet Take 200 mg by mouth at bedtime.       . risperiDONE (RISPERDAL) 3 MG tablet Take 1/2 at bedtime      . simvastatin (ZOCOR) 40 MG tablet Take 40 mg by mouth every evening.      . sitaGLIPtin (JANUVIA) 100 MG tablet Take 100 mg by mouth every evening.      . Vitamin D, Ergocalciferol, (DRISDOL) 50000 UNITS CAPS Take 50,000 Units by mouth. Take on friday      . DISCONTD: insulin glargine (LANTUS SOLOSTAR) 100 UNIT/ML injection Inject 30 Units into the skin daily.  10 mL  6  . predniSONE (DELTASONE) 20 MG tablet Take 3 daily for 4 days,  2 daily for 4 days, 1 daily for 4 days then stop  24 tablet  0    Allergies  Allergen Reactions  . Aspirin Other (See Comments)    Causes nosebleeds  . Penicillins Itching  . Theophylline Nausea And Vomiting    Current Medications, Allergies, Past Medical History, Past Surgical History, Family History, and Social History were reviewed in [employer] record.    Review of Systems         See HPI - all other systems neg except as noted...      The patient complains of weight gain, dyspnea on exertion, muscle weakness, difficulty walking, and depression.  The patient denies anorexia, fever, weight  loss, vision loss, decreased hearing, hoarseness, chest pain, syncope, peripheral edema, prolonged cough, headaches, hemoptysis, abdominal pain, melena, hematochezia, severe indigestion/heartburn, hematuria, incontinence, suspicious skin lesions, transient blindness, unusual weight change, abnormal bleeding, enlarged lymph nodes, and angioedema.  Objective:   Physical Exam     WD, Obese, 71 y/o WF in NAD... she is chr ill appearing... GENERAL:  Alert & oriented x 3... HEENT:  [place]/AT, sl pale conjuct, EOM-wnl, PERRLA, EACs-clear, TMs-wnl, NOSE-clear, THROAT-clear w/ dry MMs... NECK:  Supple w/ fairROM; no JVD; normal carotid impulses w/o bruits; no thyromegaly or nodules palpated; no lymphadenopathy. CHEST:  Clear to P & A; without wheezes/ rales/ or rhonchi heard... HEART:  Regular Rhythm;  gr 1/6 SEM without rubs or gallops... ABDOMEN:  Obese, soft & nontender w/ panniculus; normal bowel sounds; no organomegaly or masses detected. EXT: without deformities, mod arthritic changes; no varicose veins/ +venous insuffic/ tr edema. NEURO:  CN's intact;  no focal neuro deficits... DERM:  No lesions noted; no rash, pale complexion.  RADIOLOGY DATA:  Reviewed in the EPIC EMR & discussed w/ the patient...  LABORATORY DATA:  Reviewed in the EPIC EMR & discussed w/ the patient...   Assessment & Plan:   COPD>  Combined obstructive & restrictive lung dis; PFT showed sl deterioration in FEV1; multifactorial dyspnea including stress; REC> continue Advair, NEBS, O2, Mucinex, etc; get on diet/ exercise/ get weight down; continue Psyche f/u for help w/ anxiety...  HBP>  Controlled on low dose BBlocker, diuretic> continue same & work on weight reduction...  CAD>  Known nonobstructive dis, way too sedentary> we discussed diet + exercise...  AS>  Stable w/o angina, syncope, etc; she has f/u DrCooper soon...  CHOL>  Stable on the Simva40...  DM>  Control is improved w/ A1c down to 6.9, weight down 10#,  ?on diet; we reviewed diet/ exercise/ & wt reduction strategies; REC to continue Lantus20, Metformin, Amaryl, Januvia.  OBESITY>  Reviewed diet + exercise needed...  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  Autoimmune hepatitis>  Also managed by DrBrodie, off the Immuran at present... She also has pancytopenia related to her cirrhosis.  RLS>  Notes from DrDohmeier reviewed> on Sinemet + Requip...  Dysthymia>  followed by Psyche on mult meds...  Anemia>  Last received 1000mg  IV Iron Oct2012 & we continue to monitor Hg & Fe- we will continue labs Q8wks...   Patient's Medications  New Prescriptions   PANTOPRAZOLE (PROTONIX) 40 MG TABLET    Take 1 tablet (40 mg total) by mouth daily.  Previous Medications   ALBUTEROL (PROAIR HFA) 108 (90 BASE) MCG/ACT INHALER    Inhale 2 puffs into the lungs every 6 (six) hours as needed.     ALPRAZOLAM (XANAX) 1 MG TABLET    Take 1 mg by mouth at bedtime.    ARMODAFINIL (NUVIGIL) 150 MG TABLET    Take 150 mg by mouth daily.   ATENOLOL (TENORMIN) 50 MG TABLET    Take 50 mg by mouth daily.   B COMPLEX VITAMINS TABLET    Take 1 tablet by mouth daily.    B-D ULTRAFINE III SHORT PEN 31G X 8 MM MISC    USE AS DIRECTED   BUPROPION (WELLBUTRIN XL) 150 MG 24 HR TABLET    Take 150 mg by mouth daily.   CARBIDOPA-LEVODOPA (SINEMET CR) 25-100 MG PER TABLET    Take 1 tablet by mouth 2 (two) times daily. 1 at lunch and 1 at dinner   DESVENLAFAXINE (PRISTIQ) 100 MG 24 HR TABLET    Take 100 mg by mouth daily.   DEXTROMETHORPHAN-GUAIFENESIN (MUCINEX DM) 30-600 MG PER 12 HR TABLET    Take 1-2  tablets by mouth every 12 (twelve) hours as needed.   FLUTICASONE-SALMETEROL (ADVAIR DISKUS) 250-50 MCG/DOSE AEPB    Inhale 1 puff into the lungs every 12 (twelve) hours.    FUROSEMIDE (LASIX) 40 MG TABLET    Take 40 mg by mouth daily.   GLIMEPIRIDE (AMARYL) 4 MG TABLET    Take 4 mg by mouth daily before breakfast.     METFORMIN (GLUCOPHAGE) 500 MG TABLET    Take 1,000 mg by mouth 2 (two) times daily with a meal.   MULTIPLE VITAMINS-MINERALS (CENTRUM SILVER PO)    Take 1 tablet by mouth daily.    POTASSIUM CHLORIDE SA (K-DUR,KLOR-CON) 20 MEQ TABLET    Take 20 mEq by mouth daily.   PREDNISONE (DELTASONE) 20 MG TABLET    Take 3 daily for 4 days,  2 daily for 4 days, 1 daily for 4 days then stop   PROMETHAZINE (PHENERGAN) 25 MG TABLET    Take 25 mg by mouth every 6 (six) hours as needed.     QUETIAPINE (SEROQUEL) 100 MG TABLET    Take 200 mg by mouth at bedtime.    RISPERIDONE (RISPERDAL) 3 MG TABLET    Take 1/2 at bedtime   SIMVASTATIN (ZOCOR) 40 MG TABLET    Take 40 mg by mouth every evening.   SITAGLIPTIN (JANUVIA) 100 MG TABLET    Take 100 mg by mouth every evening.   VITAMIN D, ERGOCALCIFEROL, (DRISDOL) 50000 UNITS CAPS    Take 50,000 Units by mouth. Take on friday  Modified Medications   Modified Medication Previous Medication   INSULIN GLARGINE (LANTUS) 100 UNIT/ML INJECTION insulin glargine (LANTUS SOLOSTAR) 100 UNIT/ML injection      Inject 35 Units into the skin daily.    Inject 30 Units into the skin daily.  Discontinued Medications   OMEPRAZOLE (PRILOSEC) 20 MG CAPSULE    Take 20 mg by mouth daily.

## 2012-01-15 ENCOUNTER — Other Ambulatory Visit (INDEPENDENT_AMBULATORY_CARE_PROVIDER_SITE_OTHER): Payer: Medicare Other

## 2012-01-15 DIAGNOSIS — I10 Essential (primary) hypertension: Secondary | ICD-10-CM

## 2012-01-15 DIAGNOSIS — F419 Anxiety disorder, unspecified: Secondary | ICD-10-CM

## 2012-01-15 DIAGNOSIS — D126 Benign neoplasm of colon, unspecified: Secondary | ICD-10-CM

## 2012-01-15 DIAGNOSIS — D500 Iron deficiency anemia secondary to blood loss (chronic): Secondary | ICD-10-CM

## 2012-01-15 DIAGNOSIS — E119 Type 2 diabetes mellitus without complications: Secondary | ICD-10-CM

## 2012-01-15 DIAGNOSIS — F411 Generalized anxiety disorder: Secondary | ICD-10-CM

## 2012-01-15 DIAGNOSIS — E7800 Pure hypercholesterolemia, unspecified: Secondary | ICD-10-CM

## 2012-01-15 LAB — CBC WITH DIFFERENTIAL/PLATELET
Basophils Relative: 0.5 % (ref 0.0–3.0)
Eosinophils Relative: 5.2 % — ABNORMAL HIGH (ref 0.0–5.0)
HCT: 30.1 % — ABNORMAL LOW (ref 36.0–46.0)
Hemoglobin: 9.8 g/dL — ABNORMAL LOW (ref 12.0–15.0)
Lymphs Abs: 0.6 10*3/uL — ABNORMAL LOW (ref 0.7–4.0)
MCV: 97.9 fl (ref 78.0–100.0)
Monocytes Absolute: 0.4 10*3/uL (ref 0.1–1.0)
Neutro Abs: 2.4 10*3/uL (ref 1.4–7.7)
Platelets: 81 10*3/uL — ABNORMAL LOW (ref 150.0–400.0)
RBC: 3.08 Mil/uL — ABNORMAL LOW (ref 3.87–5.11)
WBC: 3.6 10*3/uL — ABNORMAL LOW (ref 4.5–10.5)

## 2012-01-15 LAB — HEPATIC FUNCTION PANEL
AST: 46 U/L — ABNORMAL HIGH (ref 0–37)
Total Bilirubin: 0.9 mg/dL (ref 0.3–1.2)

## 2012-01-15 LAB — HEMOGLOBIN A1C: Hgb A1c MFr Bld: 6.9 % — ABNORMAL HIGH (ref 4.6–6.5)

## 2012-01-15 LAB — BASIC METABOLIC PANEL
BUN: 18 mg/dL (ref 6–23)
Calcium: 9.3 mg/dL (ref 8.4–10.5)
GFR: 60.61 mL/min (ref >60.00)
Potassium: 4 mEq/L (ref 3.5–5.1)

## 2012-01-15 LAB — LIPID PANEL
Cholesterol: 94 mg/dL (ref 0–200)
LDL Cholesterol: 36 mg/dL (ref 0–99)
VLDL: 26 mg/dL (ref 0.0–40.0)

## 2012-01-15 LAB — IBC PANEL: Saturation Ratios: 12.8 % — ABNORMAL LOW (ref 20.0–50.0)

## 2012-01-17 ENCOUNTER — Telehealth: Payer: Self-pay | Admitting: Pulmonary Disease

## 2012-01-17 NOTE — Telephone Encounter (Signed)
Called and spoke with pt about her lab results per SN. See results note on pt.  Nothing further needed.

## 2012-02-04 ENCOUNTER — Telehealth: Payer: Self-pay | Admitting: Pulmonary Disease

## 2012-02-04 NOTE — Telephone Encounter (Signed)
Called, spoke with pt who is requesting samples of Advair 250/50 and januiva 100 mg.  Sample of each placed at front for pick up.  Pt aware.  Advair Lot # [id number], Exp Date 01/2013 Januvia 100 mg Lot # [id number], Exp Date Dec 2015

## 2012-02-05 ENCOUNTER — Telehealth: Payer: Self-pay | Admitting: Pulmonary Disease

## 2012-02-05 NOTE — Telephone Encounter (Signed)
Called and spoke with pt and per SN---increase her lantus to 40 units daily---pt stated that she has been on 35 units daily since she was d/c from the hospital.  Pt is aware to increase her fluid intake and rest and see if this helps her to feel better.  She will call back tomorrow if not better.  She is aware to call if her nose bleeds return--she stated that she is not really wanting to see an ENT at this time.  Pt voiced her understanding of this.

## 2012-02-05 NOTE — Telephone Encounter (Signed)
Pt called back again. Wants to hear from nurse asap. [name]

## 2012-02-05 NOTE — Telephone Encounter (Signed)
Called and spoke with pt and she stated that her sugar has been high for the last 2 days 6/11--361 6/12  This am--288 and at 1:30 288. She stated that she has only had breakfast today.  Pt stated that she has been taking her meds daily and that she has not had any injections or prednisone recently.  She also stated that she had a nosebleed this morning and this has finally stopped.  SN please advise.   Thanks Allergies  Allergen Reactions  . Aspirin Other (See Comments)    Causes nosebleeds  . Penicillins Itching  . Theophylline Nausea And Vomiting

## 2012-02-09 ENCOUNTER — Other Ambulatory Visit: Payer: Self-pay | Admitting: Pulmonary Disease

## 2012-02-24 ENCOUNTER — Other Ambulatory Visit (INDEPENDENT_AMBULATORY_CARE_PROVIDER_SITE_OTHER): Payer: Medicare Other

## 2012-02-24 ENCOUNTER — Encounter: Payer: Self-pay | Admitting: Pulmonary Disease

## 2012-02-24 ENCOUNTER — Ambulatory Visit (INDEPENDENT_AMBULATORY_CARE_PROVIDER_SITE_OTHER): Payer: Medicare Other | Admitting: Pulmonary Disease

## 2012-02-24 VITALS — BP 110/62 | HR 50 | Temp 97.0°F | Ht 64.0 in | Wt 213.4 lb

## 2012-02-24 DIAGNOSIS — I10 Essential (primary) hypertension: Secondary | ICD-10-CM

## 2012-02-24 DIAGNOSIS — D126 Benign neoplasm of colon, unspecified: Secondary | ICD-10-CM

## 2012-02-24 DIAGNOSIS — I2510 Atherosclerotic heart disease of native coronary artery without angina pectoris: Secondary | ICD-10-CM

## 2012-02-24 DIAGNOSIS — K5730 Diverticulosis of large intestine without perforation or abscess without bleeding: Secondary | ICD-10-CM

## 2012-02-24 DIAGNOSIS — E119 Type 2 diabetes mellitus without complications: Secondary | ICD-10-CM

## 2012-02-24 DIAGNOSIS — G2581 Restless legs syndrome: Secondary | ICD-10-CM

## 2012-02-24 DIAGNOSIS — K754 Autoimmune hepatitis: Secondary | ICD-10-CM

## 2012-02-24 DIAGNOSIS — IMO0001 Reserved for inherently not codable concepts without codable children: Secondary | ICD-10-CM

## 2012-02-24 DIAGNOSIS — I359 Nonrheumatic aortic valve disorder, unspecified: Secondary | ICD-10-CM

## 2012-02-24 DIAGNOSIS — K219 Gastro-esophageal reflux disease without esophagitis: Secondary | ICD-10-CM

## 2012-02-24 DIAGNOSIS — J449 Chronic obstructive pulmonary disease, unspecified: Secondary | ICD-10-CM

## 2012-02-24 DIAGNOSIS — E669 Obesity, unspecified: Secondary | ICD-10-CM

## 2012-02-24 DIAGNOSIS — F333 Major depressive disorder, recurrent, severe with psychotic symptoms: Secondary | ICD-10-CM

## 2012-02-24 DIAGNOSIS — E7800 Pure hypercholesterolemia, unspecified: Secondary | ICD-10-CM

## 2012-02-24 DIAGNOSIS — M1990 Unspecified osteoarthritis, unspecified site: Secondary | ICD-10-CM

## 2012-02-24 LAB — BASIC METABOLIC PANEL
CO2: 27 mEq/L (ref 19–32)
Chloride: 101 mEq/L (ref 96–112)
Potassium: 4.1 mEq/L (ref 3.5–5.1)
Sodium: 136 mEq/L (ref 135–145)

## 2012-02-24 LAB — HEMOGLOBIN A1C: Hgb A1c MFr Bld: 7.4 % — ABNORMAL HIGH (ref 4.6–6.5)

## 2012-02-24 MED ORDER — VITAMIN D (ERGOCALCIFEROL) 1.25 MG (50000 UNIT) PO CAPS: 50000.0000 [IU] | ORAL_CAPSULE | ORAL | Status: DC

## 2012-02-24 MED ORDER — ROPINIROLE HCL 4 MG PO TABS: 4.0000 mg | ORAL_TABLET | Freq: Every day | ORAL | Status: DC

## 2012-02-24 NOTE — Patient Instructions (Addendum)
Today we updated your med list in our EPIC system...    Continue your current medications the same...  We gave you a new diabetic meter today> let's see if your numbers are more accurate...  We also rechecked your basic metabolic panel...    We will call you w/ the result when avail...  Watch your diet & increase your exercise...  We decided to restart the REQUIP for your RLS, 4mg  tabs (start w/ 1/2 pill at bedtime & increase to 1 tab)...  Call for any questions...  Let's plan a follow up visit in 3 months...  ADDENDUM: slowly incr Lantus by 5u increments until the SB<200

## 2012-02-25 ENCOUNTER — Encounter: Payer: Self-pay | Admitting: Pulmonary Disease

## 2012-02-25 NOTE — Progress Notes (Signed)
Subjective:    Patient ID: [name], female    DOB: 1940/12/22, 71 y.o.   MRN: [id number]  HPI 71 y/o WF here for a follow up visit... she has mult med problems as noted below>   ~  October 25, 2011:  69mo ROV & [name] is w/o complaints today; SEE 06/12/11 OV for prev note string...  We rechecked labs today (see below), no changes made...    COPD> on Home O2, Advair250, NEBS, Mucinex; remote smoker quit yrs ago; known obstructive & restrictive lung dis; she has lost 10# in the interval & feels as though her breathing is sl improved as a result; asked to continue the same meds for now.     HBP> on Aten50, Lasix40Bid, K20; BP= 120/72 & she has mult somatic complaints but no specific CP, palpit, ch in SOB or edema; continue low sodium 7 wt reduction.     CAD & AS/AI> known non-obstructive CAD; hx atypCP/ CWP; no angina & she remains way too sedentary; discussed risk factor reduction strategy & she has f/u Cards DrCooper soon.    Chol> on Simva40; not really on diet & we discussed this; last FLP 10/12 showed TChol 110, TG 116, HDL 34, LDL 53; continue same, needs exercise!    DM> on Lantus20, Metform1000Bid, Glimep4, Januvia100; needs much better diet & exercise program! Not checking BS at home; BS=276, A1c=6.8 (improved from prev) & Pharm indicates that she is filling pretty regularly...    Obesity> way too sedentary, ?on diet & she has finally lost a little wt (down 10# to 215# today); we discussed diet, exercise, wt reduction strategies...    {GERD, Angiodysplasia w/ chronic GI blood loss...    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 We will continue  to check labs every 49mo & ROV in 69mo...    DJD/ FM/ ?osteopenia/ VitD defic> she has mult somatic complaints & takes Tylenol prn; on VitD 50K weekly...    RLS> on Requip4Qhs & Sinemet25/100Bid from DrDohmeier w/ fair control of symptoms...    Anxiety/ Depression/ Psyche> on RX per Psychiatry DrHejazi (we don't have notes from him) & she does not know her meds, didn't bring med bottles or med list!  As best we can tell she is on Nuvigil, Pristiq, Wellbutrin, Risperdal, Seroquel, Xanax> all from Psychiatry & she is reminded to bring all bottles to every visit... LABS 3/13:  Chems- ok x BS=276 A1c=6.8;  CBC- Hg=11.2 Fe=71 WBC=4.2K, Plat=75K...  ~  Jan 13, 2012:  3mo ROV & post hosp visit> Hosp 3/29 - 11/26/11 w/ COPD exac & mult chronic medical issues; treated w/ Pred & Zithromax & improved;  disch on Oxygen 2L/min, Advair250Bid, Pred taper, Proair prn;  Now improved & approaching baseline, she has completed the Pred taper but noted incr SOB, cough, & yellow sputum==> she wants additional antibiotic rx & we wrote for Avelox x7d...  She has mult additional somatic complaints> dizzy, abd discomfort, RLS, etc; overall they note fewer good days and more bad days; he daugh has been in the hosp & it wears her out to go  for a visit...     Hx DM on Lantus plus Metformin500mg - 2Bid, Glimepiride 4mg  Qam, & Januvia 100mg /d> BS=191, A1c=6.9 and she will maintain her current regimen + diet & try to incr exercise...    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    We reviewed her prob list, meds, xrays and labs> see below>>  LABS 5/13:  FLP- at goals on Simva40 x HDL=32;  Chems- ok x BS=191 A1c=6.9 LFTs=sl elev;  CBC- pancytopenic w/ Hg=9.8 Fe=53;  TSH=3.50  ~  February 24, 2012:  6wk ROV & [name] has experienced slowly incr blood sugars requiring incr Lantus  doses- now at 40u daily & BS checks still in the 300s; in addition she is c/o worsening RLS- prev evaluated by DrDohmeier, Neurology & she was on Requip & Sinemet w/ better control but she stopped the Requip on her own some time ago w/ recurrent RLS symptoms...    Breathing is ok; BP controlled on Aten & Lasix; Chol prev ok on Simva40;  She remains on 6 psychotopic meds from Psychiatry, [name] but she never brings bottles 7 we don't have notes..    We reviewed prob list, meds, xrays and labs> see below>> LABS 7/13:  Chems- ok x BS=339;  A1c=7.4 REC to slowly incr Lantus by 5u increments until           Problem List:  COPD (ICD-496) & BRONCHITIS, RECURRENT (ICD-491.9) - Ex-smoker w/ underlying COPD on home O2, ADVAIR 250Bid, ALBUTEROL (via nebs or MDI for Prn use), & MUCINEX 1-2Bid... she has combined obstructive AND restrictive disease (based on her obesity) w/ diet (weight reduction) + exercise strongly recommended to the pt...  ~  CTChest 5/06 = neg. ~  baseline CXR w/ bilat LL scarring, NAD.... last CXR 3/09 = unchanged, NAD. ~  PFTs 3/09 showed FVC= 2.47 (83%), FEV1= 1.65 (69%), %1sec= 67, mid-flows= 28%pred. ~  CXR 4/12 showed bilat scarring, NAD... ~  PFT 10/12 showed FVC= 2.28 (77%), FEV1= 1.46 (64%), %1sec= 64, mid-flows= 31% pred. ~  CXR 3/13 showed normal heart size, prom hilar regions, no infiltrates etc, NAD.[name]. ~  CTAngio Chest 3/13 showed neg for PE- scarring at lung bases, no adenopathy or lung lesions, calcified coronaries, multilevel spondylosis in TSpine, cirrhosis in liver  HYPERTENSION (ICD-401.9) - on ATENOLOL 50mg /d,  LASIX 40mg /d & K20/d...  ~  10/11:  BP=116/78 and she has mult somatic complaints but all are diminished compared to prev visits; denies bad HA's, CP, palpit, syncope, edema, etc... ~  4/12:  BP= 140/60 and she persists w/ mult somatic complaints... ~  10/12:  BP= 122/74 & she has mult somatic complaints; needs better low sodium, calorie restricted diet & wt  reduction... ~  3/13:  BP= 120/72 & she is unchanged symptomatically;  EKG 3/13 showed NSR, rate68, LAD, late transition, NSSTTWA... ~  7/13:  BP= 110/62 & she persists w/ mult somatic complaints...  CAD (ICD-414.00) - Hosp at [facility] 4/09 w/ CP... cath showed non-obstructive CAD... no CP, palpit, etc (but she is way too sedentary). ~  cath 4/09 by DrCooper showed normLMAIN, 20% midLAD, norm CIRC, 20-30% proxRCA, EF= 50-55%... ~  EKG 1/11 showed SBrady, rate54, ?septal infarct, NSSTTWA, NAD... ~  EKG 3/13 showed NSR, rate68, LAD, late transition, NSSTTWA... ~  CTAngio Chest 3/13 showed calcif coronaries as noted...  PALPITATIONS (  ICD-785.1) - eval by DrKlein w/ 7 beats WT on monitor... he changed Lisinopril to BBlocker (currently ATENOLOL 50mg /d) and improved... ~  she saw DrCopper for Cards f/u 1/11- palpit well controlled on Atenolol; hx non-obstructive CAD & good LVF; no change in Rx.  AORTIC STENOSIS (ICD-424.1) - she has mild Ao Valve disease w/ AS/ AI... followed by DrCooper. ~  2DEcho 11/06 showed mild ca++ AoV w/ mild AS, norm LVF w/ EF=55-65% ~  repeat 2DEcho 1/09 showed similar mild ca++ AoV w/ mild AS, mild AI, no regional wall motion abn, norm LVf w/ EF= 60%... NOTE: est PAsys= 35... ~  repeat 2DEcho 1/10 showed norm LVF w/ EF= 55-60%, no regional wall motion abn, mild DD, mod calcif AoV & mild reduced leaflet excursion & mild AI...  HYPERCHOLESTEROLEMIA (ICD-272.0) - on SIMVASTATIN 40mg /d... ~  FLP 6/09 showed TChol 107, TG 69, HDL 35, LDL 59 ~  FLP 3/10 showed TChol 128, TG 125, HDL 33, LDL 70 ~  FLP 12/10 showed TChol 98, TG 64, HDL 37, LDL 48... continue same. ~  FLP 10/11 showed TChol 124, TG 105, HDL 37, LDL 66 ~  FLP 4/12 showed TChol 122, TG 95, HDL 37, LDL 66 ~  FLP 10/12 on Simva40 showed TChol 110, TG 116, HDL 34, LDL 53 ~  FLP 5/13 on Simva40 showed TChol 94, TG 130, HDL 32, LDL 36  DM (ICD-250.00) - hx irreg control, not really on diet... on LANTUS 20u daily,   METFORMIN 500mg -2Bid,  GLIMEPIRIDE 4mg /d,  JANUVIA 100mg /d...  ~  labs 10/08 BS=198, HgA1C=7.2 ~  labs 1/09 showed BS=119, HgA1c=6.0 ~  labs 6/09 showed BS= 248, HgA1c= 7.0..[name] discussed need for starting insulin if she can't get on track and get wt down... ~  labs 9/09 showed BS= 236, HgA1c= 7.6.Marland KitchenMarland Kitchen rec- start Lantus 5u/d... grad increased to 10u. ~  labs 11/09 showed BS= 166, HgA1c= 6.0.Marland KitchenMarland Kitchen rec- great! keep same. ~  labs 3/10 showed BS= 169, A1c= 6.8 ~  labs 12/10 showed BS= 149, A1c= 5.8, Umicroalb= neg. ~  labs 10/11 showed BS= 215, A1c= 7.1.[name]. rec> better diet, get wt down. ~  Labs 4/12 showed BS= 172, A1c=5.8 ~  Labs 10/12 showed BS= 245, A1c= 8.1.Marland KitchenMarland Kitchen rec to take meds every day; incr Lantus dose 10==> 20u/d... ~  Labs 3/13 on Lantus20+79meds showed BS= 276, A1c= 6.8 (improved)... ~  4/13:  No diabetic retinopathy per optometrist at [facility]... ~  Labs 5/13 on Lantus20+83meds showed BS= 191, A1c= 6.9 ==> subsequently BS incr & Lantus incr ~  Labs 7/13 on Lantus40+57meds showed BS= 339, A1c= 7.4.Marland KitchenMarland Kitchen ?why control is off, rec move shots around & incr by 5u/wk til BS<200.  MORBID OBESITY (ICD-278.01) - not really on diet or exercise program... ~  weight 6/09 = 223# ~  weight 11/09 = 228# ~  weight 3/10 = 225# ~  weight 6/10 = 217# ~  weight 9/10 = 213# ~  weight 12/10 = 224# ~  weight 2/11 = 216# ~  weight 10/11 = 230# ~  Weight 4/12 = 222# ~  Weight 10/12 = 225# ~  Weight 3/13 = 215#...  great job, keep it up... 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  ~  EGD  2/06 showed angiodysplasia & mult gastric pseudopolyps,  watermelon stomach improved from 2003. ~  recent f/u EGD 9/09 showed chr gastritis, no watermelon stomach & improved from prev... ~  recurrent anemia  12/10 w/ f/u EGD 2/11 showing gastric antral avm's- s/p argon laser ablation, no varicies etc... 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 ~  5/13:  She is not on PPI and c/o some abd discomfort- rec starting PROTONIX 40mg /d...  DIVERTICULOSIS OF COLON (ICD-562.10) & POLYP, COLON (ICD-211.3) -  ~  last colonoscopy 10/08 w/ adenomatous polyp removed, and angiodysplasia without hemorrhage ~  CT Abd 10/08 showed Cirrhosis, borderline splenomeg, sl pancreatic atrophy & duct dil, sm umbil hernia, DJD sp, NAD...  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 ~  10/11:  presents w/ dysuria & UTI Rx w/ Cipro... ~  12/11:  She had f/u DrGrapey...  OSTEOARTHRITIS (ICD-715.90)  FIBROMYALGIA (ICD-729.1) - she persists w/ mult somatic complaints...  ? of OSTEOPOROSIS (ICD-733.00) - she had normal BMD in 2001 at [place]..  RESTLESS LEGS SYNDROME (ICD-333.94) - she prev weaned off the Requip & remains on SINEMET-CR 25-100Bid... ~  9/10: now c/o recurrent restless leg symptoms... restart REQUIP 2mg  & titrate up. ~  12/10: persistant symptoms on 2mg Qhs... rec to incr to 4mg ... ~  2/11: she reports resolution of RLS on 4mg  Requip Qhs, resting well now. ~  12/11:  Recurrent RLS symptoms despite the Requip & seen by DrDohmeier w/ addition of Sinemet 25/100 Bid... ~  2012:  Stable on Requip4mg  & Sinemet  25/100 Bid... ~  7/13:  She is once again c/o incr RLS symptoms & rec to restart REQUIP 4mg /d...   DYSTHYMIA (ICD-300.4) - Hx depression w/ psychotic features and hosp 2/09 at [facility] w/ delusions of pedunculosis> she was prev treated by Dr. Bartholomew Crews & DrSuttenfield for counselling...  Adm to [facility] in May09 by DrBarbSmith w/ depression and meds adjusted (see DC summary)...  Now followed by [name] and [name] at [facility]- she was re-admitted 10/09 and meds changed to RISPERDAL, PRISTIQUE, BUPROPION, SEROQUEL, XANAX & Nuvigil (Armodafinil)...  husb indicates that she is much better at present & continues outpt Rx... ~  MRI/ MRA Brain 2/08 showed sm vessel dis & atherosclerotic changes in post circ... ~  9/10: pt indicates that she is off the Trazodone... DrHejazi added Wellbutrin ?for her RLS? ~  2011:  pt continues regular f/u w/ Psychiatry on numerous meds w/ freq med adjustments; she is requested to get notes from [facility] to our attention & bring all med bottles to her OVs for review... ~  2012:  We have not received any notes from Psyche; pt has not complied w/ our request for med bottles to be brought to each OV... ~  2013:  We still don't have accurate list of her current meds as we have not received Psyche notes & she  NEVER brings meds to office despite our requests...  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 ~  labs 04/18/08 by DrBrodie showed Hg= 10.4, Fe= 52... she ordered 1,200mg  IV Iron- given 04/26/08. ~  labs 05/19/08 showed Hg= 11.3, Fe= 117... ~  labs 11/09 showed Hg= 12.0.Marland KitchenMarland Kitchen ~  labs 3/10 showed Hg= 12.3, Fe= 79 ~  labs 6/10 showed Hg= 11.5, Fe= 73 ~  labs 12/10 showed Hg= 7.2, Fe= 28 (6%sat)... given [facility] & IV Fe per  DrBrodie in Jan2012. ~  labs 2/11 showed Hg= 11.8, Fe= 95... pt indicates that DrBrodie will be following labs going forward... ~  labs 6/11 showed Hg= 11.2 ~  labs 9/11 showed Hg= 11.1, Fe= 106 ~  Labs 4/12 showed Hg=11.0, Fe=118 ~  Labs 10/12 showed Hg=9.5, Fe=35 (7%sat)... We will arrange for outpt infusion 1000mg  Fe... ~  Labs 12/12 showed Hg=11.4, Fe=92 (26%sat) ~  Labs 3/13 showed Hg=11.2, Fe=71 (17%sat) ~  Labs 5/13 showed Hg=9.8, Fe=53 (13%sat)   Past Surgical History  Procedure Date  . Hemorroidectomy   . Appendectomy   . Vaginal hysterectomy     Outpatient Encounter Prescriptions as of 02/24/2012  Medication Sig Dispense Refill  . albuterol (PROAIR HFA) 108 (90 BASE) MCG/ACT inhaler Inhale 2 puffs into the lungs every 6 (six) hours as needed.        . ALPRAZolam (XANAX) 1 MG tablet Take 1 mg by mouth at bedtime.       . Armodafinil (NUVIGIL) 150 MG tablet Take 150 mg by mouth daily.      [name] atenolol (TENORMIN) 50 MG tablet Take 50 mg by mouth daily.      [name] b complex vitamins tablet Take 1 tablet by mouth daily.       . B-D ULTRAFINE III SHORT PEN 31G X 8 MM MISC USE AS DIRECTED  100 each  6  . buPROPion (WELLBUTRIN XL) 150 MG 24 hr tablet Take 150 mg by mouth daily.      . carbidopa-levodopa (SINEMET CR) 25-100 MG per tablet Take 1 tablet by mouth 2 (two) times daily. 1 at lunch and 1 at dinner      . desvenlafaxine (PRISTIQ) 100 MG 24 hr tablet Take 100 mg by mouth daily.      [name] dextromethorphan-guaiFENesin (MUCINEX DM) 30-600 MG per 12 hr tablet Take 1-2 tablets by mouth every 12 (twelve) hours as needed.      . Fluticasone-Salmeterol (ADVAIR DISKUS) 250-50 MCG/DOSE AEPB Inhale 1 puff into the lungs every 12 (twelve) hours.       . furosemide (LASIX) 40 MG tablet Take 40 mg by mouth daily.      [name] glimepiride (AMARYL) 4 MG tablet Take 4 mg by mouth daily before breakfast.      . insulin glargine (LANTUS) 100 UNIT/ML injection Inject 40 Units into the skin daily.       .  metFORMIN (GLUCOPHAGE) 500 MG tablet TAKE 2 TABLETS BY MOUTH TWICE A DAY  120 tablet  4  . Multiple Vitamins-Minerals (CENTRUM SILVER PO) Take 1 tablet by mouth daily.       . pantoprazole (PROTONIX) 40 MG tablet Take 1 tablet (40 mg total) by mouth daily.  90 tablet  3  . potassium chloride SA (K-DUR,KLOR-CON) 20 MEQ tablet Take 20  mEq by mouth daily.      . promethazine (PHENERGAN) 25 MG tablet Take 25 mg by mouth every 6 (six) hours as needed.        [name] QUEtiapine (SEROQUEL) 100 MG tablet Take 200 mg by mouth at bedtime.       . risperiDONE (RISPERDAL) 3 MG tablet Take 1/2 at bedtime      . simvastatin (ZOCOR) 40 MG tablet Take 40 mg by mouth every evening.      . sitaGLIPtin (JANUVIA) 100 MG tablet Take 100 mg by mouth every evening.      . Vitamin D, Ergocalciferol, (DRISDOL) 50000 UNITS CAPS Take 1 capsule (50,000 Units total) by mouth every 7 (seven) days. Take on friday  5 capsule  11  . DISCONTD: Vitamin D, Ergocalciferol, (DRISDOL) 50000 UNITS CAPS Take 50,000 Units by mouth. Take on friday      . rOPINIRole (REQUIP) 4 MG tablet Take 1 tablet (4 mg total) by mouth at bedtime.  30 tablet  6  . DISCONTD: metFORMIN (GLUCOPHAGE) 500 MG tablet Take 1,000 mg by mouth 2 (two) times daily with a meal.      . DISCONTD: predniSONE (DELTASONE) 20 MG tablet Take 3 daily for 4 days,  2 daily for 4 days, 1 daily for 4 days then stop  24 tablet  0    Allergies  Allergen Reactions  . Aspirin Other (See Comments)    Causes nosebleeds  . Penicillins Itching  . Theophylline Nausea And Vomiting    Current Medications, Allergies, Past Medical History, Past Surgical History, Family History, and Social History were reviewed in [employer] record.    Review of Systems         See HPI - all other systems neg except as noted...      The patient complains of weight gain, dyspnea on exertion, muscle weakness, difficulty walking, and depression.  The patient denies anorexia, fever,  weight loss, vision loss, decreased hearing, hoarseness, chest pain, syncope, peripheral edema, prolonged cough, headaches, hemoptysis, abdominal pain, melena, hematochezia, severe indigestion/heartburn, hematuria, incontinence, suspicious skin lesions, transient blindness, unusual weight change, abnormal bleeding, enlarged lymph nodes, and angioedema.     Objective:   Physical Exam     WD, Obese, 71 y/o WF in NAD... she is chr ill appearing... GENERAL:  Alert & oriented x 3... HEENT:  [place]/AT, pale conjuct, EOM-wnl, PERRLA, EACs-clear, TMs-wnl, NOSE-clear, THROAT-clear w/ dry MMs... NECK:  Supple w/ fairROM; no JVD; normal carotid impulses w/o bruits; no thyromegaly or nodules palpated; no lymphadenopathy. CHEST:  Clear to P & A; without wheezes/ rales/ or rhonchi heard... HEART:  Regular Rhythm;  gr 1/6 SEM without rubs or gallops... ABDOMEN:  Obese, soft & nontender w/ panniculus; normal bowel sounds; no organomegaly or masses detected. EXT: without deformities, mod arthritic changes; no varicose veins/ +venous insuffic/ tr edema. NEURO:  CN's intact;  no focal neuro deficits... DERM:  No lesions noted; no rash, pale complexion.  RADIOLOGY DATA:  Reviewed in the EPIC EMR & discussed w/ the patient...  LABORATORY DATA:  Reviewed in the EPIC EMR & discussed w/ the patient...   Assessment & Plan:   COPD>  Combined obstructive & restrictive lung dis; PFT showed sl deterioration in FEV1; multifactorial dyspnea including stress; REC> continue Advair, NEBS, O2, Mucinex, etc; get on diet/ exercise/ get weight down; continue Psyche f/u for help w/ anxiety...  HBP>  Controlled on low dose BBlocker, diuretic> continue same & work on [employer]  reduction...  CAD>  Known nonobstructive dis, way too sedentary> we discussed diet + exercise...  AS>  Stable w/o angina, syncope, etc; she has f/u DrCooper soon...  CHOL>  Stable on the Simva40...  DM>  Control is volatile, now way off & I'm not sure why?  She is asked to move shots around, & slowly incr dose of Lantus by 5u every wk or so til BS<200  OBESITY>  Reviewed diet + exercise needed...  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  Autoimmune hepatitis>  Also managed by DrBrodie, off the Immuran at present... She also has pancytopenia related to her cirrhosis.  RLS>  Notes from DrDohmeier reviewed> on Sinemet + needs to restart Requip...  Dysthymia>  followed by Psyche on mult meds...  Anemia>  Last received 1000mg  IV Iron Oct2012 & we continue to monitor Hg & Fe- we will continue labs Q8wks...   Patient's Medications  New Prescriptions   ROPINIROLE (REQUIP) 4 MG TABLET    Take 1 tablet (4 mg total) by mouth at bedtime.  Previous Medications   ALBUTEROL (PROAIR HFA) 108 (90 BASE) MCG/ACT INHALER    Inhale 2 puffs into the lungs every 6 (six) hours as needed.     ALPRAZOLAM (XANAX) 1 MG TABLET    Take 1 mg by mouth at bedtime.    ARMODAFINIL (NUVIGIL) 150 MG TABLET    Take 150 mg by mouth daily.   ATENOLOL (TENORMIN) 50 MG TABLET    Take 50 mg by mouth daily.   B COMPLEX VITAMINS TABLET    Take 1 tablet by mouth daily.    B-D ULTRAFINE III SHORT PEN 31G X 8 MM MISC    USE AS DIRECTED   BUPROPION (WELLBUTRIN XL) 150 MG 24 HR TABLET    Take 150 mg by mouth daily.   CARBIDOPA-LEVODOPA (SINEMET CR) 25-100 MG PER TABLET    Take 1 tablet by mouth 2 (two) times daily. 1 at lunch and 1 at dinner   DESVENLAFAXINE (PRISTIQ) 100 MG 24 HR TABLET    Take 100 mg by mouth daily.   DEXTROMETHORPHAN-GUAIFENESIN (MUCINEX DM) 30-600 MG PER 12 HR TABLET    Take 1-2 tablets by mouth every 12 (twelve) hours as needed.   FLUTICASONE-SALMETEROL (ADVAIR DISKUS) 250-50 MCG/DOSE AEPB    Inhale 1 puff into the lungs every 12 (twelve) hours.    FUROSEMIDE (LASIX) 40 MG TABLET    Take 40 mg by mouth daily.   GLIMEPIRIDE (AMARYL) 4 MG TABLET    Take 4 mg by mouth daily before breakfast.    INSULIN GLARGINE (LANTUS) 100 UNIT/ML INJECTION    Inject 40 Units into the skin daily.    METFORMIN (GLUCOPHAGE) 500 MG TABLET    TAKE 2 TABLETS BY MOUTH TWICE A DAY   MULTIPLE VITAMINS-MINERALS (CENTRUM SILVER PO)    Take 1 tablet by mouth daily.    PANTOPRAZOLE (PROTONIX) 40 MG TABLET    Take 1 tablet (40 mg total) by mouth daily.   POTASSIUM CHLORIDE SA (K-DUR,KLOR-CON) 20 MEQ TABLET    Take 20 mEq by mouth daily.   PROMETHAZINE (PHENERGAN) 25 MG TABLET    Take 25 mg by mouth every 6 (six) hours as needed.     QUETIAPINE (SEROQUEL) 100 MG TABLET    Take 200 mg by mouth at bedtime.    RISPERIDONE (RISPERDAL) 3 MG TABLET    Take 1/2 at bedtime   SIMVASTATIN (  ZOCOR) 40 MG TABLET    Take 40 mg by mouth every evening.   SITAGLIPTIN (JANUVIA) 100 MG TABLET    Take 100 mg by mouth every evening.  Modified Medications   Modified Medication Previous Medication   VITAMIN D, ERGOCALCIFEROL, (DRISDOL) 50000 UNITS CAPS Vitamin D, Ergocalciferol, (DRISDOL) 50000 UNITS CAPS      Take 1 capsule (50,000 Units total) by mouth every 7 (seven) days. Take on friday    Take 50,000 Units by mouth. Take on friday  Discontinued Medications   METFORMIN (GLUCOPHAGE) 500 MG TABLET    Take 1,000 mg by mouth 2 (two) times daily with a meal.   PREDNISONE (DELTASONE) 20 MG TABLET    Take 3 daily for 4 days,  2 daily for 4 days, 1 daily for 4 days then stop

## 2012-03-04 ENCOUNTER — Ambulatory Visit (INDEPENDENT_AMBULATORY_CARE_PROVIDER_SITE_OTHER): Payer: Medicare Other | Admitting: Cardiovascular Disease

## 2012-03-04 ENCOUNTER — Encounter: Payer: Self-pay | Admitting: Cardiovascular Disease

## 2012-03-04 VITALS — BP 125/63 | HR 60 | Ht 64.0 in | Wt 210.8 lb

## 2012-03-04 DIAGNOSIS — I2510 Atherosclerotic heart disease of native coronary artery without angina pectoris: Secondary | ICD-10-CM

## 2012-03-04 DIAGNOSIS — I359 Nonrheumatic aortic valve disorder, unspecified: Secondary | ICD-10-CM

## 2012-03-04 NOTE — Progress Notes (Signed)
HPI:  71 year old woman presenting for followup evaluation. The patient has a history of nonobstructive CAD, palpitations, and chronic dyspnea. She underwent cardiac catheterization in 2009. This demonstrated nonobstructive disease with preserved LV function. The patient has had no recent chest pain or pressure. Her palpitations have been well-controlled on atenolol. She does have significant chronic dyspnea and she is extremely sedentary. She has shortness of breath with low-level activities. She denies leg swelling, orthopnea, or PND. Her last echocardiogram was in 2010 and her LVEF was 55-60%. The aortic valve was moderately calcified and there was a mobile echodensity on the non-coronary cusp.  Outpatient Encounter Prescriptions as of 03/04/2012  Medication Sig Dispense Refill  . albuterol (PROAIR HFA) 108 (90 BASE) MCG/ACT inhaler Inhale 2 puffs into the lungs every 6 (six) hours as needed.        . ALPRAZolam (XANAX) 1 MG tablet Take 1 mg by mouth at bedtime.       . Armodafinil (NUVIGIL) 150 MG tablet Take 150 mg by mouth daily.      [name] atenolol (TENORMIN) 50 MG tablet Take 50 mg by mouth daily.      [name] b complex vitamins tablet Take 1 tablet by mouth daily.       . B-D ULTRAFINE III SHORT PEN 31G X 8 MM MISC USE AS DIRECTED  100 each  6  . buPROPion (WELLBUTRIN XL) 150 MG 24 hr tablet Take 150 mg by mouth daily.      . carbidopa-levodopa (SINEMET CR) 25-100 MG per tablet Take 1 tablet by mouth 2 (two) times daily. 1 at lunch and 1 at dinner      . desvenlafaxine (PRISTIQ) 100 MG 24 hr tablet Take 100 mg by mouth daily.      [name] dextromethorphan-guaiFENesin (MUCINEX DM) 30-600 MG per 12 hr tablet Take 1-2 tablets by mouth every 12 (twelve) hours as needed.      . Fluticasone-Salmeterol (ADVAIR DISKUS) 250-50 MCG/DOSE AEPB Inhale 1 puff into the lungs every 12 (twelve) hours.       . furosemide (LASIX) 40 MG tablet Take 40 mg by mouth daily.      [name] glimepiride (AMARYL) 4 MG tablet Take 4 mg by  mouth daily before breakfast.      . insulin glargine (LANTUS) 100 UNIT/ML injection Inject 40 Units into the skin daily.       . metFORMIN (GLUCOPHAGE) 500 MG tablet TAKE 2 TABLETS BY MOUTH TWICE A DAY  120 tablet  4  . Multiple Vitamins-Minerals (CENTRUM SILVER PO) Take 1 tablet by mouth daily.       . pantoprazole (PROTONIX) 40 MG tablet Take 1 tablet (40 mg total) by mouth daily.  90 tablet  3  . potassium chloride SA (K-DUR,KLOR-CON) 20 MEQ tablet Take 20 mEq by mouth daily.      . promethazine (PHENERGAN) 25 MG tablet Take 25 mg by mouth every 6 (six) hours as needed.        [name] QUEtiapine (SEROQUEL) 100 MG tablet Take 200 mg by mouth at bedtime.       . risperiDONE (RISPERDAL) 3 MG tablet Take 1/2 at bedtime      . rOPINIRole (REQUIP) 4 MG tablet Take 1 tablet (4 mg total) by mouth at bedtime.  30 tablet  6  . simvastatin (ZOCOR) 40 MG tablet Take 40 mg by mouth every evening.      . sitaGLIPtin (JANUVIA) 100 MG tablet Take 100 mg by mouth every evening.      [name]  Vitamin D, Ergocalciferol, (DRISDOL) 50000 UNITS CAPS Take 1 capsule (50,000 Units total) by mouth every 7 (seven) days. Take on friday  5 capsule  11    Allergies  Allergen Reactions  . Aspirin Other (See Comments)    Causes nosebleeds  . Penicillins Itching  . Theophylline Nausea And Vomiting    Past Medical History  Diagnosis Date  . Chronic airway obstruction, not elsewhere classified   . Unspecified chronic bronchitis   . Unspecified essential hypertension   . Coronary atherosclerosis of unspecified type of vessel, native or graft   . Palpitations   . Aortic valve disorders   . Pure hypercholesterolemia   . Morbid obesity   . Esophageal reflux   . Angiodysplasia of intestine (without mention of hemorrhage)   . Diverticulosis of colon (without mention of hemorrhage)   . Benign neoplasm of colon   . Autoimmune hepatitis   . Cystitis, unspecified   . Osteoarthrosis, unspecified whether generalized or localized,  unspecified site   . Osteoporosis, unspecified   . Restless legs syndrome (RLS)   . Major depressive disorder, recurrent episode, severe, specified as with psychotic behavior   . Iron deficiency anemia secondary to blood loss (chronic)   . Coarse tremors     "just in my arms"  . Carpal tunnel syndrome on both sides   . Dysrhythmia     palpitations  . CHF (congestive heart failure)   . Asthma   . Shortness of breath     "any time really"  . Sleep apnea     "but I don't use the equipment"  . Type II diabetes mellitus   . Blood transfusion   . H/O hiatal hernia   . Hepatic cirrhosis     dx'd 1990's    ROS: Negative except as per HPI  BP 125/63  Pulse 60  Ht 5\' 4"  (1.626 m)  Wt 95.618 kg (210 lb 12.8 oz)  BMI 36.18 kg/m2  PHYSICAL EXAM: Pt is alert and oriented, obese woman in NAD HEENT: normal Neck: JVP - normal, carotids 2+= without bruits Lungs: CTA bilaterally CV: RRR with a grade 2/6 systolic ejection murmur at the left sternal border Abd: soft, NT, Positive BS, obese Ext: no C/C/E, distal pulses intact and equal Skin: warm/dry no rash  ASSESSMENT AND PLAN: 1. Nonobstructive CAD. The patient has no anginal symptoms. She will continue her current medical program.  2. Mild aortic stenosis with mobile echo density on previous echo. Recommend repeat echocardiogram to reassess. I do not think her shortness of breath is related to this problem.  3. Dyslipidemia. The patient is on simvastatin. Her lipids are at goal with LDL 36, total cholesterol 94.  4. Obesity. Long discussion about the importance of increasing her activity level. She is extremely sedentary and she understands that this is contributing to her progressive shortness of breath and weakness.  Tonny Bollman 03/04/2012 6:42 PM

## 2012-03-04 NOTE — Patient Instructions (Addendum)
Your physician has requested that you have an echocardiogram. Echocardiography is a painless test that uses sound waves to create images of your heart. It provides your doctor with information about the size and shape of your heart and how well your heart's chambers and valves are working. This procedure takes approximately one hour. There are no restrictions for this procedure.  Your physician wants you to follow-up in: 1 YEAR with Dr Cooper. You will receive a reminder letter in the mail two months in advance. If you don't receive a letter, please call our office to schedule the follow-up appointment.  Your physician recommends that you continue on your current medications as directed. Please refer to the Current Medication list given to you today.  

## 2012-03-10 ENCOUNTER — Ambulatory Visit (HOSPITAL_COMMUNITY): Payer: Medicare Other | Attending: Cardiovascular Disease

## 2012-03-10 DIAGNOSIS — I359 Nonrheumatic aortic valve disorder, unspecified: Secondary | ICD-10-CM | POA: Insufficient documentation

## 2012-03-10 DIAGNOSIS — R0989 Other specified symptoms and signs involving the circulatory and respiratory systems: Secondary | ICD-10-CM | POA: Insufficient documentation

## 2012-03-10 DIAGNOSIS — E119 Type 2 diabetes mellitus without complications: Secondary | ICD-10-CM | POA: Insufficient documentation

## 2012-03-10 DIAGNOSIS — I517 Cardiomegaly: Secondary | ICD-10-CM | POA: Insufficient documentation

## 2012-03-10 DIAGNOSIS — I10 Essential (primary) hypertension: Secondary | ICD-10-CM | POA: Insufficient documentation

## 2012-03-10 DIAGNOSIS — I509 Heart failure, unspecified: Secondary | ICD-10-CM | POA: Insufficient documentation

## 2012-03-10 DIAGNOSIS — I2510 Atherosclerotic heart disease of native coronary artery without angina pectoris: Secondary | ICD-10-CM | POA: Insufficient documentation

## 2012-03-10 DIAGNOSIS — R0609 Other forms of dyspnea: Secondary | ICD-10-CM | POA: Insufficient documentation

## 2012-03-10 DIAGNOSIS — R002 Palpitations: Secondary | ICD-10-CM | POA: Insufficient documentation

## 2012-03-10 DIAGNOSIS — G4733 Obstructive sleep apnea (adult) (pediatric): Secondary | ICD-10-CM | POA: Insufficient documentation

## 2012-03-10 NOTE — Progress Notes (Signed)
Echocardiogram performed.  

## 2012-03-26 ENCOUNTER — Telehealth: Payer: Self-pay | Admitting: Pulmonary Disease

## 2012-03-26 NOTE — Telephone Encounter (Signed)
Samples of advair and januvia left up front for pick up. Pt aware.

## 2012-04-07 ENCOUNTER — Telehealth: Payer: Self-pay | Admitting: Internal Medicine

## 2012-04-07 ENCOUNTER — Encounter: Payer: Self-pay | Admitting: Internal Medicine

## 2012-04-07 DIAGNOSIS — D509 Iron deficiency anemia, unspecified: Secondary | ICD-10-CM

## 2012-04-07 NOTE — Telephone Encounter (Signed)
ERROR-PT OF DR Juanda Chance

## 2012-04-07 NOTE — Telephone Encounter (Signed)
Yes, I have reviewed her recent Labs: Iron saturation 15%, please schedule for Iron infusion. No need for test dose. Give  100 0mg  IV over 4hrs., repeat CBC 4 weeks later.

## 2012-04-07 NOTE — Telephone Encounter (Signed)
Patient states her neurologist sent labs to Dr. Juanda Chance to review and she was told she would need iron. Dr. Juanda Chance, have you seen these labs?

## 2012-04-08 ENCOUNTER — Other Ambulatory Visit: Payer: Self-pay | Admitting: *Deleted

## 2012-04-08 NOTE — Telephone Encounter (Signed)
Scheduled IV iron infusion on 04/22/12 at [facility] short stay(dee). Orders in EPIC. Lab in [facility] for 4 weeks after infusion.Patient aware of appointment date/time.

## 2012-04-13 ENCOUNTER — Telehealth: Payer: Self-pay | Admitting: Internal Medicine

## 2012-04-13 NOTE — Telephone Encounter (Signed)
Patient clammy and sweating. States she took her blood sugar and it is not abnormal for her. She states  Her neurologist and Dr. Kriste Basque have been changing her medications. Suggested patient call them with new symptoms since changing medication.

## 2012-04-22 ENCOUNTER — Encounter (HOSPITAL_COMMUNITY)
Admission: RE | Admit: 2012-04-22 | Discharge: 2012-04-22 | Disposition: A | Discharge status: Home / Self Care | Payer: Medicare Other | Source: Ambulatory Visit | Attending: Internal Medicine | Admitting: Internal Medicine

## 2012-04-22 ENCOUNTER — Encounter (HOSPITAL_COMMUNITY): Payer: Self-pay

## 2012-04-22 DIAGNOSIS — D509 Iron deficiency anemia, unspecified: Secondary | ICD-10-CM | POA: Insufficient documentation

## 2012-04-22 MED ORDER — SODIUM CHLORIDE 0.9 % IV SOLN
INTRAVENOUS | Status: DC
  Administered 2012-04-22: 250 mL via INTRAVENOUS

## 2012-04-22 MED ORDER — SODIUM CHLORIDE 0.9 % IV SOLN
1000.0000 mg | Freq: Once | INTRAVENOUS | Status: AC
  Administered 2012-04-22: 1000 mg via INTRAVENOUS
  Filled 2012-04-22: qty 20

## 2012-04-26 ENCOUNTER — Other Ambulatory Visit: Payer: Self-pay | Admitting: Cardiovascular Disease

## 2012-04-26 ENCOUNTER — Other Ambulatory Visit: Payer: Self-pay | Admitting: Pulmonary Disease

## 2012-04-28 ENCOUNTER — Telehealth: Payer: Self-pay | Admitting: Pulmonary Disease

## 2012-04-28 ENCOUNTER — Other Ambulatory Visit: Payer: Self-pay | Admitting: Pulmonary Disease

## 2012-04-28 NOTE — Telephone Encounter (Signed)
i have left samples upfront for pick up. Pt  aware

## 2012-05-13 ENCOUNTER — Encounter: Payer: Self-pay | Admitting: *Deleted

## 2012-05-26 ENCOUNTER — Ambulatory Visit (INDEPENDENT_AMBULATORY_CARE_PROVIDER_SITE_OTHER): Payer: Medicare Other | Admitting: Pulmonary Disease

## 2012-05-26 ENCOUNTER — Encounter: Payer: Self-pay | Admitting: Pulmonary Disease

## 2012-05-26 ENCOUNTER — Other Ambulatory Visit (INDEPENDENT_AMBULATORY_CARE_PROVIDER_SITE_OTHER): Payer: Medicare Other

## 2012-05-26 VITALS — BP 118/60 | HR 68 | Temp 99.8°F | Ht 64.0 in | Wt 201.6 lb

## 2012-05-26 DIAGNOSIS — E7800 Pure hypercholesterolemia, unspecified: Secondary | ICD-10-CM

## 2012-05-26 DIAGNOSIS — J449 Chronic obstructive pulmonary disease, unspecified: Secondary | ICD-10-CM

## 2012-05-26 DIAGNOSIS — Z23 Encounter for immunization: Secondary | ICD-10-CM

## 2012-05-26 DIAGNOSIS — G2581 Restless legs syndrome: Secondary | ICD-10-CM

## 2012-05-26 DIAGNOSIS — K754 Autoimmune hepatitis: Secondary | ICD-10-CM

## 2012-05-26 DIAGNOSIS — K219 Gastro-esophageal reflux disease without esophagitis: Secondary | ICD-10-CM

## 2012-05-26 DIAGNOSIS — I10 Essential (primary) hypertension: Secondary | ICD-10-CM

## 2012-05-26 DIAGNOSIS — R251 Tremor, unspecified: Secondary | ICD-10-CM

## 2012-05-26 DIAGNOSIS — J4489 Other specified chronic obstructive pulmonary disease: Secondary | ICD-10-CM

## 2012-05-26 DIAGNOSIS — D649 Anemia, unspecified: Secondary | ICD-10-CM

## 2012-05-26 DIAGNOSIS — K5520 Angiodysplasia of colon without hemorrhage: Secondary | ICD-10-CM

## 2012-05-26 DIAGNOSIS — F333 Major depressive disorder, recurrent, severe with psychotic symptoms: Secondary | ICD-10-CM

## 2012-05-26 DIAGNOSIS — I2510 Atherosclerotic heart disease of native coronary artery without angina pectoris: Secondary | ICD-10-CM

## 2012-05-26 DIAGNOSIS — E119 Type 2 diabetes mellitus without complications: Secondary | ICD-10-CM

## 2012-05-26 DIAGNOSIS — I359 Nonrheumatic aortic valve disorder, unspecified: Secondary | ICD-10-CM

## 2012-05-26 LAB — CBC WITH DIFFERENTIAL/PLATELET
Basophils Absolute: 0 10*3/uL (ref 0.0–0.1)
Eosinophils Absolute: 0.2 10*3/uL (ref 0.0–0.7)
Hemoglobin: 11.1 g/dL — ABNORMAL LOW (ref 12.0–15.0)
Lymphocytes Relative: 11.5 % — ABNORMAL LOW (ref 12.0–46.0)
Lymphs Abs: 0.5 10*3/uL — ABNORMAL LOW (ref 0.7–4.0)
MCHC: 32.8 g/dL (ref 30.0–36.0)
MCV: 98.1 fl (ref 78.0–100.0)
Monocytes Absolute: 0.4 10*3/uL (ref 0.1–1.0)
Neutro Abs: 3.2 10*3/uL (ref 1.4–7.7)
RDW: 18.5 % — ABNORMAL HIGH (ref 11.5–14.6)

## 2012-05-26 LAB — HEMOGLOBIN A1C: Hgb A1c MFr Bld: 5 % (ref 4.6–6.5)

## 2012-05-26 MED ORDER — FLUTICASONE-SALMETEROL 250-50 MCG/DOSE IN AEPB: 1.0000 | INHALATION_SPRAY | Freq: Two times a day (BID) | RESPIRATORY_TRACT | Status: DC

## 2012-05-26 MED ORDER — SITAGLIPTIN PHOSPHATE 100 MG PO TABS: 100.0000 mg | ORAL_TABLET | Freq: Every evening | ORAL | Status: DC

## 2012-05-26 NOTE — Patient Instructions (Addendum)
Today we updated your med list in our EPIC system...    Continue your current medications the same...  Today we checked you follow up blood work...    We will call you w/ the results & see if there are any med changes required...  Today we gave you the 2013 Flu vaccine...  Call for any questions...  Let's plan a follow up visit in 4-6 months.Marland KitchenMarland Kitchen

## 2012-05-26 NOTE — Progress Notes (Signed)
Subjective:    Patient ID: [name], female    DOB: 1940/12/22, 71 y.o.   MRN: [id number]  HPI 71 y/o WF here for a follow up visit... she has mult med problems as noted below>   ~  October 25, 2011:  69mo ROV & [name] is w/o complaints today; SEE 06/12/11 OV for prev note string...  We rechecked labs today (see below), no changes made...    COPD> on Home O2, Advair250, NEBS, Mucinex; remote smoker quit yrs ago; known obstructive & restrictive lung dis; she has lost 10# in the interval & feels as though her breathing is sl improved as a result; asked to continue the same meds for now.     HBP> on Aten50, Lasix40Bid, K20; BP= 120/72 & she has mult somatic complaints but no specific CP, palpit, ch in SOB or edema; continue low sodium 7 wt reduction.     CAD & AS/AI> known non-obstructive CAD; hx atypCP/ CWP; no angina & she remains way too sedentary; discussed risk factor reduction strategy & she has f/u Cards DrCooper soon.    Chol> on Simva40; not really on diet & we discussed this; last FLP 10/12 showed TChol 110, TG 116, HDL 34, LDL 53; continue same, needs exercise!    DM> on Lantus20, Metform1000Bid, Glimep4, Januvia100; needs much better diet & exercise program! Not checking BS at home; BS=276, A1c=6.8 (improved from prev) & Pharm indicates that she is filling pretty regularly...    Obesity> way too sedentary, ?on diet & she has finally lost a little wt (down 10# to 215# today); we discussed diet, exercise, wt reduction strategies...    {GERD, Angiodysplasia w/ chronic GI blood loss...    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 We will continue  to check labs every 49mo & ROV in 69mo...    DJD/ FM/ ?osteopenia/ VitD defic> she has mult somatic complaints & takes Tylenol prn; on VitD 50K weekly...    RLS> on Requip4Qhs & Sinemet25/100Bid from DrDohmeier w/ fair control of symptoms...    Anxiety/ Depression/ Psyche> on RX per Psychiatry DrHejazi (we don't have notes from him) & she does not know her meds, didn't bring med bottles or med list!  As best we can tell she is on Nuvigil, Pristiq, Wellbutrin, Risperdal, Seroquel, Xanax> all from Psychiatry & she is reminded to bring all bottles to every visit... LABS 3/13:  Chems- ok x BS=276 A1c=6.8;  CBC- Hg=11.2 Fe=71 WBC=4.2K, Plat=75K...  ~  Jan 13, 2012:  3mo ROV & post hosp visit> Hosp 3/29 - 11/26/11 w/ COPD exac & mult chronic medical issues; treated w/ Pred & Zithromax & improved;  disch on Oxygen 2L/min, Advair250Bid, Pred taper, Proair prn;  Now improved & approaching baseline, she has completed the Pred taper but noted incr SOB, cough, & yellow sputum==> she wants additional antibiotic rx & we wrote for Avelox x7d...  She has mult additional somatic complaints> dizzy, abd discomfort, RLS, etc; overall they note fewer good days and more bad days; he daugh has been in the hosp & it wears her out to go  for a visit...     Hx DM on Lantus plus Metformin500mg - 2Bid, Glimepiride 4mg  Qam, & Januvia 100mg /d> BS=191, A1c=6.9 and she will maintain her current regimen + diet & try to incr exercise...    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    We reviewed her prob list, meds, xrays and labs> see below>>  LABS 5/13:  FLP- at goals on Simva40 x HDL=32;  Chems- ok x BS=191 A1c=6.9 LFTs=sl elev;  CBC- pancytopenic w/ Hg=9.8 Fe=53;  TSH=3.50  ~  February 24, 2012:  6wk ROV & [name] has experienced slowly incr blood sugars requiring incr Lantus  doses- now at 40u daily & BS checks still in the 300s; in addition she is c/o worsening RLS- prev evaluated by DrDohmeier, Neurology & she was on Requip & Sinemet w/ better control but she stopped the Requip on her own some time ago w/ recurrent RLS symptoms...    Breathing is ok; BP controlled on Aten & Lasix; Chol prev ok on Simva40;  She remains on 6 psychotopic meds from Psychiatry, [name] but she never brings bottles & we don't have notes..    We reviewed prob list, meds, xrays and labs> see below>> LABS 7/13:  Chems- ok x BS=339;  A1c=7.4 REC to slowly incr Lantus by 5u increments until FBS <150...  ~  May 26, 2012:  74mo ROV & [name] relates the following issues since last visit>>    She saw DrDohmeier for Neuro for f/u Tremors & she made several adjustments in her Psyche meds- stopped the Risperdal & decr the Seroquel to 100mg /d; pt notes that tremors are better but still signif (also on SinemetCR-Bid) & she claims "withdrawal" from the Risperdal cessation w/ fallx2/ "messed up my liver"/ "sugars went crazy"...  Initiation of Requip really has helped the RLS...    ?Labs by Neuro showed low iron by pt report (we don't have notes from DrDohmeier) & DrDBrodie ordered Iron infusion for pt- done 04/22/12 w/ 1000mg  Iron Dextran infused at day surg; she is due for f/u labs today> Hg=  Fe=     She is c/o choking while eating/ swallowing & she has f/u appt w/ DrDBrodie to see about repeat EGD w/ dilatation (last EGD 2011 showed normal esoph, GAVE syndrome w/ argon laser ablation)...    DM control "went crazy" she says> she has cut the Lantus to 25u/d, & still taking the Metform500-2Bid, Glim4/d, Januv100/d; BS at home varies betw 68-242 & labs today showed BS=  A1c=      We reviewed prob list, meds, xrays and labs> see below for updates >> OK Flu vaccine today... LABS 10/13:  Chems- ok x BS=164 A1c=5.0;  CBC- ok x Hg=11.1 WBC=4.2 Plat=86 Fe=76 (23%)           Problem List:  COPD (ICD-496) &  BRONCHITIS, RECURRENT (ICD-491.9) - Ex-smoker w/ underlying COPD on home O2, ADVAIR 250Bid, ALBUTEROL (via nebs or MDI for Prn use), & MUCINEX 1-2Bid... she has combined obstructive AND restrictive disease (based on her obesity) w/ diet (weight reduction) + exercise strongly recommended to the pt...  ~  CTChest 5/06 = neg. ~  baseline CXR w/ bilat LL scarring, NAD.... last CXR 3/09 = unchanged, NAD. ~  PFTs 3/09 showed FVC= 2.47 (83%), FEV1= 1.65 (69%), %1sec= 67, mid-flows= 28%pred. ~  CXR 4/12 showed  bilat scarring, NAD... ~  PFT 10/12 showed FVC= 2.28 (77%), FEV1= 1.46 (64%), %1sec= 64, mid-flows= 31% pred. ~  CXR 3/13 showed normal heart size, prom hilar regions, no infiltrates etc, NAD.[name]. ~  CTAngio Chest 3/13 showed neg for PE- scarring at lung bases, no adenopathy or lung lesions, calcified coronaries, multilevel spondylosis in TSpine, cirrhosis in liver  HYPERTENSION (ICD-401.9) - on ATENOLOL 50mg /d,  LASIX 40mg /d & K20/d...  ~  10/11:  BP=116/78 and she has mult somatic complaints but all are diminished compared to prev visits; denies bad HA's, CP, palpit, syncope, edema, etc... ~  4/12:  BP= 140/60 and she persists w/ mult somatic complaints... ~  10/12:  BP= 122/74 & she has mult somatic complaints; needs better low sodium, calorie restricted diet & wt reduction... ~  3/13:  BP= 120/72 & she is unchanged symptomatically;  EKG 3/13 showed NSR, rate68, LAD, late transition, NSSTTWA... ~  7/13:  BP= 110/62 & she persists w/ mult somatic complaints... ~  10/13:  BP= 118/60 & she remains clinically stable...  CAD (ICD-414.00) - Hosp at [facility] 4/09 w/ CP... cath showed non-obstructive CAD... no CP, palpit, etc (but she is way too sedentary). ~  cath 4/09 by DrCooper showed normLMAIN, 20% midLAD, norm CIRC, 20-30% proxRCA, EF= 50-55%... ~  EKG 1/11 showed SBrady, rate54, ?septal infarct, NSSTTWA, NAD... ~  EKG 3/13 showed NSR, rate68, LAD, late transition, NSSTTWA... ~  CTAngio Chest 3/13  showed calcif coronaries as noted...  PALPITATIONS (ICD-785.1) - eval by DrKlein w/ 7 beats WT on monitor... he changed Lisinopril to BBlocker (currently ATENOLOL 50mg /d) and improved... ~  she saw DrCopper for Cards f/u 1/11- palpit well controlled on Atenolol; hx non-obstructive CAD & good LVF; no change in Rx.  AORTIC STENOSIS (ICD-424.1) - she has mild Ao Valve disease w/ AS/ AI... followed by DrCooper. ~  2DEcho 11/06 showed mild ca++ AoV w/ mild AS, norm LVF w/ EF=55-65% ~  repeat 2DEcho 1/09 showed similar mild ca++ AoV w/ mild AS, mild AI, no regional wall motion abn, norm LVf w/ EF= 60%... NOTE: est PAsys= 35... ~  repeat 2DEcho 1/10 showed norm LVF w/ EF= 55-60%, no regional wall motion abn, mild DD, mod calcif AoV & mild reduced leaflet excursion & mild AI... ~  Repeat 2DEcho 7/13 showed norm LVF w/ EF=55-65%, no regional wall motion abn, Gr2DD, mildAS, mildAI, mild LAdil...  HYPERCHOLESTEROLEMIA (ICD-272.0) - on SIMVASTATIN 40mg /d... ~  FLP 6/09 showed TChol 107, TG 69, HDL 35, LDL 59 ~  FLP 3/10 showed TChol 128, TG 125, HDL 33, LDL 70 ~  FLP 12/10 showed TChol 98, TG 64, HDL 37, LDL 48... continue same. ~  FLP 10/11 showed TChol 124, TG 105, HDL 37, LDL 66 ~  FLP 4/12 showed TChol 122, TG 95, HDL 37, LDL 66 ~  FLP 10/12 on Simva40 showed TChol 110, TG 116, HDL 34, LDL 53 ~  FLP 5/13 on Simva40 showed TChol 94, TG 130, HDL 32, LDL 36  DM (ICD-250.00) - hx irreg control, not really on diet... on LANTUS 25u daily,  METFORMIN 500mg -2Bid,  GLIMEPIRIDE 4mg /d,  JANUVIA 100mg /d...  ~  labs 10/08 BS=198, HgA1C=7.2 ~  labs 1/09 showed BS=119, HgA1c=6.0 ~  labs 6/09 showed BS= 248, HgA1c= 7.0..[name] discussed need for starting insulin if she can't get on track and get wt down... ~  labs 9/09 showed BS= 236, HgA1c= 7.6.Marland KitchenMarland Kitchen rec- start Lantus 5u/d... grad increased to 10u. ~  labs 11/09 showed BS=  166, HgA1c= 6.0.Marland KitchenMarland Kitchen rec- great! keep same. ~  labs 3/10 showed BS= 169, A1c= 6.8 ~  labs 12/10  showed BS= 149, A1c= 5.8, Umicroalb= neg. ~  labs 10/11 showed BS= 215, A1c= 7.1.[name]. rec> better diet, get wt down. ~  Labs 4/12 showed BS= 172, A1c=5.8 ~  Labs 10/12 showed BS= 245, A1c= 8.1.Marland KitchenMarland Kitchen rec to take meds every day; incr Lantus dose 10==> 20u/d... ~  Labs 3/13 on Lantus20+13meds showed BS= 276, A1c= 6.8 (improved)... ~  4/13:  No diabetic retinopathy per optometrist at [facility]... ~  Labs 5/13 on Lantus20+93meds showed BS= 191, A1c= 6.9 ==> subsequently BS incr & Lantus incr ~  Labs 7/13 on Lantus40+54meds showed BS= 339, A1c= 7.4.Marland KitchenMarland Kitchen ?why control is off, rec move shots around & incr by 5u/wk til BS<200. ~  Labs 10/13 on Lantus25+23meds showed BS= 164, A1c= 5.0.Marland KitchenMarland Kitchen Too tight & rec to decr Lantus20 & decr Glimep4mg  to 1/2 tab...  MORBID OBESITY (ICD-278.01) - not really on diet or exercise program... ~  weight 6/09 = 223# ~  weight 11/09 = 228# ~  weight 3/10 = 225# ~  weight 6/10 = 217# ~  weight 9/10 = 213# ~  weight 12/10 = 224# ~  weight 2/11 = 216# ~  weight 10/11 = 230# ~  Weight 4/12 = 222# ~  Weight 10/12 = 225# ~  Weight 3/13 = 215#...  great job, keep it up... ~  Weight 5/13 = 210# ~  Weight 7/13 = 213# ~  Weight 10/13 = 202#... 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  ~  EGD  2/06 showed angiodysplasia & mult gastric pseudopolyps, watermelon stomach improved from 2003. ~  recent f/u EGD 9/09 showed chr gastritis, no watermelon stomach & improved from prev... ~  recurrent anemia 12/10 w/ f/u EGD 2/11 showing gastric antral avm's- s/p argon laser ablation, no varicies etc... ~  2011: she's been followed closely by GI DrDBrodie for her Autoimmune liver dis w/ Cirrhosis & splenomegaly, chr GI blood loss due to portal hypertensive gastropathy, angiodysplasia, & colon polyps> off her prev Immuran  Rx due to nausea. ~  5/13:  She is not on PPI and c/o some abd discomfort- rec starting PROTONIX 40mg /d... ~  She has f/u appt DrDBrodie 10/13...  DIVERTICULOSIS OF COLON (ICD-562.10) & POLYP, COLON (ICD-211.3) -  ~  last colonoscopy 10/08 w/ adenomatous polyp removed, and angiodysplasia without hemorrhage ~  CT Abd 10/08 showed Cirrhosis, borderline splenomeg, sl pancreatic atrophy & duct dil, sm umbil hernia, DJD sp, NAD...  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 ~  10/11:  presents w/ dysuria & UTI Rx w/ Cipro... ~  12/11:  She had f/u DrGrapey...  OSTEOARTHRITIS (ICD-715.90)  FIBROMYALGIA (ICD-729.1) - she persists w/ mult somatic complaints...  ? of OSTEOPOROSIS (ICD-733.00) - she had normal BMD in 2001 at [place]..  RESTLESS LEGS SYNDROME (  ICD-333.94) - she prev weaned off the Requip & remains on SINEMET-CR 25-100Bid... ~  9/10: now c/o recurrent restless leg symptoms... restart REQUIP 2mg  & titrate up. ~  12/10: persistant symptoms on 2mg Qhs... rec to incr to 4mg ... ~  2/11: she reports resolution of RLS on 4mg  Requip Qhs, resting well now. ~  12/11:  Recurrent RLS symptoms despite the Requip & seen by DrDohmeier w/ addition of Sinemet 25/100 Bid... ~  2012:  Stable on Requip4mg  & Sinemet 25/100 Bid... ~  7/13:  She is once again c/o incr RLS symptoms & rec to restart REQUIP 4mg /d...   DYSTHYMIA (ICD-300.4) - Hx depression w/ psychotic features and hosp 2/09 at [facility] w/ delusions of pedunculosis> she was prev treated by Dr. Bartholomew Crews &  DrSuttenfield for counselling...  Adm to [facility] in May09 by DrBarbSmith w/ depression and meds adjusted (see DC summary)...  Now followed by [name] and [name] at [facility]- she was re-admitted 10/09 and meds changed to RISPERDAL, PRISTIQUE, BUPROPION, SEROQUEL, XANAX & Nuvigil (Armodafinil)...  husb indicates that she is much better at present & continues outpt Rx... ~  MRI/ [facility] Brain 2/08 showed sm vessel dis & atherosclerotic changes in post circ... ~  9/10: pt indicates that she is off the Trazodone... DrHejazi added Wellbutrin ?for her RLS? ~  2011:  pt continues regular f/u w/ Psychiatry on numerous meds w/ freq med adjustments; she is requested to get notes from [facility] to our attention & bring all med bottles to her OVs for review... ~  2012:  We have not received any notes from Psyche; pt has not complied w/ our request for med bottles to be brought to each OV... ~  2013:  We still don't have accurate list of her current meds as we have not received Psyche notes & she NEVER brings meds to office despite our requests...  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 ~  labs 04/18/08 by DrBrodie showed Hg= 10.4, Fe= 52... she ordered 1,200mg  IV Iron- given 04/26/08. ~  labs 05/19/08 showed Hg= 11.3, Fe= 117... ~  labs 11/09 showed Hg= 12.0.Marland KitchenMarland Kitchen ~  labs 3/10 showed Hg= 12.3, Fe= 79 ~  labs 6/10 showed Hg= 11.5, Fe= 73 ~  labs 12/10 showed Hg= 7.2, Fe= 28 (6%sat)... given [facility] & IV Fe per DrBrodie in Jan2012. ~  labs 2/11 showed Hg= 11.8, Fe= 95... pt indicates that DrBrodie will be following labs going forward... ~  labs 6/11 showed Hg= 11.2 ~  labs 9/11 showed Hg= 11.1, Fe= 106 ~  Labs 4/12 showed Hg=11.0, Fe=118 ~  Labs 10/12 showed Hg=9.5, Fe=35  (7%sat)... We will arrange for outpt infusion 1000mg  Fe... ~  Labs 12/12 showed Hg=11.4, Fe=92 (26%sat) ~  Labs 3/13 showed Hg=11.2, Fe=71 (17%sat) ~  Labs 5/13 showed Hg=9.8, Fe=53 (13%sat) ~  04/22/12:  She was given 1000mg  IV Iron Dextran by DrDBrodie... ~  Labs 10/13 showed Hg=11.1, Fe=76 (23%)   Past Surgical History  Procedure Date  . Hemorroidectomy   . Appendectomy   . Vaginal hysterectomy   . Breast biopsy     bilateral  . Cesarean section     x 3  . Appendectomy     Outpatient Encounter Prescriptions as of 05/26/2012  Medication Sig Dispense Refill  . ADVAIR  DISKUS 250-50 MCG/DOSE AEPB INHALE 1 PUFF TWICE A DAY  60 each  11  . albuterol (PROAIR HFA) 108 (90 BASE) MCG/ACT inhaler Inhale 2 puffs into the lungs every 6 (six) hours as needed.        . ALPRAZolam (XANAX) 1 MG tablet Take 1 mg by mouth at bedtime.       . Armodafinil (NUVIGIL) 150 MG tablet Take 150 mg by mouth daily.      [name] atenolol (TENORMIN) 50 MG tablet Take 50 mg by mouth daily.      [name] atenolol (TENORMIN) 50 MG tablet TAKE 1 TABLET BY MOUTH EVERY DAY  30 tablet  10  . b complex vitamins tablet Take 1 tablet by mouth daily.       . B-D ULTRAFINE III SHORT PEN 31G X 8 MM MISC USE AS DIRECTED  100 each  6  . buPROPion (WELLBUTRIN XL) 150 MG 24 hr tablet Take 150 mg by mouth daily.      . carbidopa-levodopa (SINEMET CR) 25-100 MG per tablet Take 1 tablet by mouth 2 (two) times daily. 1 at lunch and 1 at dinner      . desvenlafaxine (PRISTIQ) 100 MG 24 hr tablet Take 100 mg by mouth daily.      [name] dextromethorphan-guaiFENesin (MUCINEX DM) 30-600 MG per 12 hr tablet Take 1-2 tablets by mouth every 12 (twelve) hours as needed.      . furosemide (LASIX) 40 MG tablet Take 40 mg by mouth daily.      [name] glimepiride (AMARYL) 4 MG tablet Take 4 mg by mouth daily before breakfast.      . insulin glargine (LANTUS) 100 UNIT/ML injection Inject 40 Units into the skin daily.       . metFORMIN (GLUCOPHAGE) 500 MG tablet TAKE 2  TABLETS BY MOUTH TWICE A DAY  120 tablet  4  . Multiple Vitamins-Minerals (CENTRUM SILVER PO) Take 1 tablet by mouth daily.       . pantoprazole (PROTONIX) 40 MG tablet Take 1 tablet (40 mg total) by mouth daily.  90 tablet  3  . potassium chloride SA (K-DUR,KLOR-CON) 20 MEQ tablet Take 20 mEq by mouth daily.      . promethazine (PHENERGAN) 25 MG tablet Take 25 mg by mouth every 6 (six) hours as needed.        [name] QUEtiapine (SEROQUEL) 100 MG tablet Take 100 mg by mouth at bedtime.       [name] rOPINIRole (REQUIP) 4 MG tablet Take 1 tablet (4 mg total) by mouth at bedtime.  30 tablet  6  . simvastatin (ZOCOR) 40 MG tablet TAKE 1 TABLET BY MOUTH AT BEDTIME FOR CHOLESTEROL  30 tablet  6  . sitaGLIPtin (JANUVIA) 100 MG tablet Take 100 mg by mouth every evening.      . Vitamin D, Ergocalciferol, (DRISDOL) 50000 UNITS CAPS Take 1 capsule (50,000 Units total) by mouth every 7 (seven) days. Take on friday  5 capsule  11  . DISCONTD: risperiDONE (RISPERDAL) 3 MG tablet Take 1/2 at bedtime      . DISCONTD: simvastatin (ZOCOR) 40 MG tablet Take 40 mg by mouth every evening.        Allergies  Allergen Reactions  . Aspirin Other (See Comments)    Causes nosebleeds  . Penicillins Itching  . Theophylline Nausea And Vomiting    Current Medications, Allergies, Past Medical History, Past Surgical History, Family History, and Social History were reviewed in [employer]  medical record.    Review of Systems         See HPI - all other systems neg except as noted...      The patient complains of weight gain, dyspnea on exertion, muscle weakness, difficulty walking, and depression.  The patient denies anorexia, fever, weight loss, vision loss, decreased hearing, hoarseness, chest pain, syncope, peripheral edema, prolonged cough, headaches, hemoptysis, abdominal pain, melena, hematochezia, severe indigestion/heartburn, hematuria, incontinence, suspicious skin lesions, transient blindness, unusual weight  change, abnormal bleeding, enlarged lymph nodes, and angioedema.     Objective:   Physical Exam     WD, Obese, 71 y/o WF in NAD... she is chr ill appearing... GENERAL:  Alert & oriented x 3... HEENT:  [place]/AT, pale conjuct, EOM-wnl, PERRLA, EACs-clear, TMs-wnl, NOSE-clear, THROAT-clear w/ dry MMs... NECK:  Supple w/ fairROM; no JVD; normal carotid impulses w/o bruits; no thyromegaly or nodules palpated; no lymphadenopathy. CHEST:  Clear to P & A; without wheezes/ rales/ or rhonchi heard... HEART:  Regular Rhythm;  gr 1/6 SEM without rubs or gallops... ABDOMEN:  Obese, soft & nontender w/ panniculus; normal bowel sounds; no organomegaly or masses detected. EXT: without deformities, mod arthritic changes; no varicose veins/ +venous insuffic/ tr edema. NEURO:  CN's intact;  no focal neuro deficits... DERM:  No lesions noted; no rash, pale complexion.  RADIOLOGY DATA:  Reviewed in the EPIC EMR & discussed w/ the patient...  LABORATORY DATA:  Reviewed in the EPIC EMR & discussed w/ the patient...   Assessment & Plan:    COPD>  Combined obstructive & restrictive lung dis; PFT showed sl deterioration in FEV1; multifactorial dyspnea including stress; REC> continue Advair, NEBS, O2, Mucinex, etc; get on diet/ exercise/ get weight down; continue Psyche f/u for help w/ anxiety...  HBP>  Controlled on low dose BBlocker, diuretic> continue same & work on weight reduction...  CAD>  Known nonobstructive dis, way too sedentary> we discussed diet + exercise...  AS>  Stable w/o angina, syncope, etc; she has f/u DrCooper soon...  CHOL>  Stable on the Simva40...  DM>  Control is volatile & she is asked to move shots around etc; today BS=20 & A1c=5.0; therefore we will decr Lantus to 20u & Glimep4mg  to 1/2 tab...  OBESITY>  Reviewed diet + exercise needed...  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  Autoimmune hepatitis>  Also  managed by DrBrodie, off the Immuran at present... She also has pancytopenia related to her cirrhosis.  RLS>  Notes from DrDohmeier reviewed> on Sinemet + needs to restart Requip...  Dysthymia>  followed by Psyche on mult meds...  Anemia>  Last received 1000mg  IV Iron [id number] & we continue to monitor Hg & Fe- we will continue labs Q8wks...   Patient's Medications  New Prescriptions   No medications on file  Previous Medications   ALBUTEROL (PROAIR HFA) 108 (90 BASE) MCG/ACT INHALER    Inhale 2 puffs into the lungs every 6 (six) hours as needed.     ALPRAZOLAM (XANAX) 1 MG TABLET    Take 1 mg by mouth at bedtime.    ARMODAFINIL (NUVIGIL) 150 MG TABLET    Take 150 mg by mouth daily.   ATENOLOL (TENORMIN) 50 MG TABLET    Take 50 mg by mouth daily.   ATENOLOL (TENORMIN) 50 MG TABLET    TAKE 1 TABLET BY MOUTH EVERY DAY   B COMPLEX VITAMINS TABLET    Take 1 tablet  by mouth daily.    B-D ULTRAFINE III SHORT PEN 31G X 8 MM MISC    USE AS DIRECTED   BUPROPION (WELLBUTRIN XL) 150 MG 24 HR TABLET    Take 150 mg by mouth daily.   CARBIDOPA-LEVODOPA (SINEMET CR) 25-100 MG PER TABLET    Take 1 tablet by mouth 2 (two) times daily. 1 at lunch and 1 at dinner   DESVENLAFAXINE (PRISTIQ) 100 MG 24 HR TABLET    Take 100 mg by mouth daily.   DEXTROMETHORPHAN-GUAIFENESIN (MUCINEX DM) 30-600 MG PER 12 HR TABLET    Take 1-2 tablets by mouth every 12 (twelve) hours as needed.   FUROSEMIDE (LASIX) 40 MG TABLET    Take 40 mg by mouth daily.   METFORMIN (GLUCOPHAGE) 500 MG TABLET    TAKE 2 TABLETS BY MOUTH TWICE A DAY   MULTIPLE VITAMINS-MINERALS (CENTRUM SILVER PO)    Take 1 tablet by mouth daily.    PANTOPRAZOLE (PROTONIX) 40 MG TABLET    Take 1 tablet (40 mg total) by mouth daily.   POTASSIUM CHLORIDE SA (K-DUR,KLOR-CON) 20 MEQ TABLET    Take 20 mEq by mouth daily.   PROMETHAZINE (PHENERGAN) 25 MG TABLET    Take 25 mg by mouth every 6 (six) hours as needed.     QUETIAPINE (SEROQUEL) 100 MG TABLET    Take 100 mg  by mouth at bedtime.    ROPINIROLE (REQUIP) 4 MG TABLET    Take 1 tablet (4 mg total) by mouth at bedtime.   SIMVASTATIN (ZOCOR) 40 MG TABLET    TAKE 1 TABLET BY MOUTH AT BEDTIME FOR CHOLESTEROL   VITAMIN D, ERGOCALCIFEROL, (DRISDOL) 50000 UNITS CAPS    Take 1 capsule (50,000 Units total) by mouth every 7 (seven) days. Take on friday  Modified Medications   Modified Medication Previous Medication   FLUTICASONE-SALMETEROL (ADVAIR DISKUS) 250-50 MCG/DOSE AEPB ADVAIR DISKUS 250-50 MCG/DOSE AEPB      Inhale 1 puff into the lungs 2 (two) times daily.    INHALE 1 PUFF TWICE A DAY   GLIMEPIRIDE (AMARYL) 4 MG TABLET glimepiride (AMARYL) 4 MG tablet      Take 1/2 tablet by mouth every morning    Take 4 mg by mouth daily before breakfast.   INSULIN GLARGINE (LANTUS) 100 UNIT/ML INJECTION insulin glargine (LANTUS) 100 UNIT/ML injection      Inject 20 Units into the skin daily.    Inject 40 Units into the skin daily.    SITAGLIPTIN (JANUVIA) 100 MG TABLET sitaGLIPtin (JANUVIA) 100 MG tablet      Take 1 tablet (100 mg total) by mouth every evening.    Take 100 mg by mouth every evening.  Discontinued Medications   RISPERIDONE (RISPERDAL) 3 MG TABLET    Take 1/2 at bedtime   SIMVASTATIN (ZOCOR) 40 MG TABLET    Take 40 mg by mouth every evening.

## 2012-05-27 LAB — IBC PANEL
Iron: 76 ug/dL (ref 42–145)
Saturation Ratios: 22.8 % (ref 20.0–50.0)

## 2012-05-27 LAB — HEPATIC FUNCTION PANEL
Bilirubin, Direct: 0.2 mg/dL (ref 0.0–0.3)
Total Bilirubin: 0.9 mg/dL (ref 0.3–1.2)

## 2012-05-27 LAB — BASIC METABOLIC PANEL
Calcium: 9.5 mg/dL (ref 8.4–10.5)
Chloride: 105 mEq/L (ref 96–112)
Creatinine, Ser: 0.8 mg/dL (ref 0.4–1.2)
GFR: 71.48 mL/min (ref >60.00)

## 2012-05-28 ENCOUNTER — Other Ambulatory Visit: Payer: Self-pay | Admitting: *Deleted

## 2012-05-28 MED ORDER — INSULIN GLARGINE 100 UNIT/ML ~~LOC~~ SOLN: 20.0000 [IU] | Freq: Every day | SUBCUTANEOUS | Status: DC

## 2012-05-28 MED ORDER — GLIMEPIRIDE 4 MG PO TABS: ORAL_TABLET | ORAL | Status: DC

## 2012-06-02 ENCOUNTER — Encounter: Payer: Self-pay | Admitting: *Deleted

## 2012-06-05 ENCOUNTER — Ambulatory Visit (INDEPENDENT_AMBULATORY_CARE_PROVIDER_SITE_OTHER): Payer: Medicare Other | Admitting: Internal Medicine

## 2012-06-05 ENCOUNTER — Encounter: Payer: Self-pay | Admitting: Internal Medicine

## 2012-06-05 VITALS — BP 120/64 | HR 60 | Ht 64.0 in | Wt 198.0 lb

## 2012-06-05 DIAGNOSIS — K766 Portal hypertension: Secondary | ICD-10-CM

## 2012-06-05 DIAGNOSIS — R110 Nausea: Secondary | ICD-10-CM

## 2012-06-05 DIAGNOSIS — K7460 Unspecified cirrhosis of liver: Secondary | ICD-10-CM

## 2012-06-05 MED ORDER — POLYETHYLENE GLYCOL 3350 17 GM/SCOOP PO POWD: 17.0000 g | Freq: Every day | ORAL | Status: DC

## 2012-06-05 NOTE — Patient Instructions (Addendum)
You have been scheduled for an endoscopy with propofol. Please follow written instructions given to you at your visit today. If you use inhalers (even only as needed), please bring them with you on the day of your procedure. We have sent the following medications to your pharmacy for you to pick up at your convenience: Miralax CC: Dr Alroy Dust

## 2012-06-05 NOTE — Progress Notes (Signed)
[name] 12/08/1940 MRN [id number]   History of Present Illness:  This is a 70-year-old white female with autoimmune liver disease, cirrhosis and portal hypertension. 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 She had an iron infusion on August 22 when her hemoglobin dropped to 9.3. She has a history of argon laser ablation of gastric AVMs. Her last ablation was in February 2010. She is here today because of solid food dysphagia. She has been on multiple medications including Protonix 40 mg daily. She is also complaining of constipation. Her last colonoscopy October 2008 showed a tubular adenoma. Her last hemoglobin 05/26/2012 was up to 11.1. Her platelet count was 86,000, AST of 50,  ALT of 47 with alkaline phosphatase of 91. Her level of energy has been low.   Past Medical History  Diagnosis Date  . Chronic airway obstruction, not elsewhere classified   . Unspecified chronic bronchitis   . Unspecified essential hypertension   . Coronary atherosclerosis of unspecified type of vessel, native or graft   . Palpitations   . Aortic valve disorders   . Pure hypercholesterolemia   . Morbid obesity   . Esophageal reflux   . Angiodysplasia of intestine (without mention of hemorrhage)   . Diverticulosis of colon (without mention of hemorrhage)   . Benign neoplasm of colon   . Autoimmune hepatitis   . Cystitis, unspecified   . Osteoarthrosis, unspecified whether generalized or localized, unspecified site   . Osteoporosis, unspecified   . Restless legs syndrome (RLS)   . Major depressive disorder, recurrent episode, severe, specified as with psychotic behavior   . Iron deficiency anemia secondary to blood loss (chronic)   . Coarse tremors     "just in my arms"  . Carpal tunnel syndrome on both sides   . Dysrhythmia     palpitations  . CHF (congestive heart failure)   . Asthma   . Shortness of breath     "any time really"  .  Sleep apnea     "but I don't use the equipment"  . Type II diabetes mellitus   . Blood transfusion   . H/O hiatal hernia   . 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 Adenomatous colon polyp    Past Surgical History  Procedure Date  . Hemorroidectomy   . Appendectomy   . Vaginal hysterectomy   . Breast biopsy     bilateral  . Cesarean section     x 3  . Appendectomy     reports that she quit smoking about 19 years ago. Her smoking use included Cigarettes. She has a 37.5 pack-year smoking history. She has never used smokeless tobacco. She reports that she drinks alcohol. She reports that she does not use illicit drugs. family history includes Breast cancer in her maternal aunt and sister; Cervical cancer in her mother; Diabetes in her fathers and sister; Heart disease in her mother; and Kidney disease in her mother.  There is no history of Colon cancer. Allergies  Allergen Reactions  . Aspirin Other (See Comments)    Causes nosebleeds  . Penicillins Itching  . Theophylline Nausea And Vomiting        Review of Systems: Dysphagia to solids. Gastroesophageal reflux weight loss of fall 20 pounds  The remainder of the 10 point ROS is negative except as outlined in H&P   Physical Exam: General appearance  Well developed, in no distress. Overweight Eyes-   non icteric. HEENT nontraumatic, normocephalic. Mouth no lesions, tongue papillated, no cheilosis. Neck supple without adenopathy, thyroid not enlarged, no carotid bruits, no JVD. Lungs Clear to auscultation bilaterally. Cor normal S1, normal S2, regular rhythm, no murmur,  quiet precordium. Abdomen: Large protuberant soft enlarged left lobe of the liver. Normal active bowel sounds. No ascites. Prominent collateral veins. Rectal: Soft Hemoccult positive stool, large amount consistent with impaction. Extremities no pedal edema. Skin no lesions. Neurological alert and oriented x 3 no asterixis. Psychological normal mood and  affect.  Assessment and Plan:  Problem #1 Solid food dysphagia suggestive of esophageal stricture in a patient with gastroesophageal reflux. She will continue Protonix 40 mg daily. We will schedule an upper endoscopy and dilatation. We will also check for the presence of esophageal varices.  Problem #2 Chronic GI blood loss from AVMs with history of angiodysplasia of the stomach and colon.  Problem #3 History of adenomatous polyps of the colon. Her last colonoscopy was in 2008. She is at high risk for a colonoscopy prep and sedation so I would hold off on colonoscopy at this time.  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 Etiology is likely due to autoimmune disease. We will monitor liver function tests and coagulation studies.   06/05/2012 Brighton Pilley  

## 2012-06-10 ENCOUNTER — Telehealth: Payer: Self-pay | Admitting: *Deleted

## 2012-06-10 NOTE — Telephone Encounter (Signed)
Per Dr. Juanda Chance, patient will need to be done at [facility] endo d/t health issues. Scheduled on Friday 06/12/12 at 7:00/8:00 AM Booking number [id number].Unable to reach patient because her mailbox is full will try again later.

## 2012-06-10 NOTE — Telephone Encounter (Signed)
Spoke with patient and gave her the new appointment time and place([facility] endo) for procedure on Friday.

## 2012-06-12 ENCOUNTER — Encounter (HOSPITAL_COMMUNITY): Payer: Self-pay

## 2012-06-12 ENCOUNTER — Encounter (HOSPITAL_COMMUNITY)
Admission: RE | Disposition: A | Discharge status: Home / Self Care | Payer: Self-pay | Source: Ambulatory Visit | Attending: Internal Medicine

## 2012-06-12 ENCOUNTER — Encounter: Payer: Medicare Other | Admitting: Internal Medicine

## 2012-06-12 ENCOUNTER — Ambulatory Visit (HOSPITAL_COMMUNITY)
Admission: RE | Admit: 2012-06-12 | Discharge: 2012-06-12 | Disposition: A | Discharge status: Home / Self Care | Payer: Medicare Other | Source: Ambulatory Visit | Attending: Internal Medicine | Admitting: Internal Medicine

## 2012-06-12 DIAGNOSIS — E7800 Pure hypercholesterolemia, unspecified: Secondary | ICD-10-CM | POA: Insufficient documentation

## 2012-06-12 DIAGNOSIS — K766 Portal hypertension: Secondary | ICD-10-CM | POA: Insufficient documentation

## 2012-06-12 DIAGNOSIS — K219 Gastro-esophageal reflux disease without esophagitis: Secondary | ICD-10-CM | POA: Insufficient documentation

## 2012-06-12 DIAGNOSIS — K7460 Unspecified cirrhosis of liver: Secondary | ICD-10-CM | POA: Insufficient documentation

## 2012-06-12 DIAGNOSIS — K31811 Angiodysplasia of stomach and duodenum with bleeding: Secondary | ICD-10-CM | POA: Insufficient documentation

## 2012-06-12 DIAGNOSIS — Z860100 Personal history of colon polyps, unspecified: Secondary | ICD-10-CM | POA: Insufficient documentation

## 2012-06-12 DIAGNOSIS — E119 Type 2 diabetes mellitus without complications: Secondary | ICD-10-CM | POA: Insufficient documentation

## 2012-06-12 DIAGNOSIS — K319 Disease of stomach and duodenum, unspecified: Secondary | ICD-10-CM | POA: Insufficient documentation

## 2012-06-12 DIAGNOSIS — I2510 Atherosclerotic heart disease of native coronary artery without angina pectoris: Secondary | ICD-10-CM | POA: Insufficient documentation

## 2012-06-12 DIAGNOSIS — R1310 Dysphagia, unspecified: Secondary | ICD-10-CM | POA: Insufficient documentation

## 2012-06-12 DIAGNOSIS — D696 Thrombocytopenia, unspecified: Secondary | ICD-10-CM | POA: Insufficient documentation

## 2012-06-12 DIAGNOSIS — Z87891 Personal history of nicotine dependence: Secondary | ICD-10-CM | POA: Insufficient documentation

## 2012-06-12 DIAGNOSIS — R161 Splenomegaly, not elsewhere classified: Secondary | ICD-10-CM | POA: Insufficient documentation

## 2012-06-12 HISTORY — PX: BALLOON DILATION: SHX5330

## 2012-06-12 HISTORY — PX: ESOPHAGOGASTRODUODENOSCOPY: SHX5428

## 2012-06-12 LAB — GLUCOSE, CAPILLARY: Glucose-Capillary: 110 mg/dL — ABNORMAL HIGH (ref 70–99)

## 2012-06-12 SURGERY — EGD (ESOPHAGOGASTRODUODENOSCOPY)
Anesthesia: Moderate Sedation

## 2012-06-12 MED ORDER — HYOSCYAMINE SULFATE 0.125 MG SL SUBL: 0.1250 mg | SUBLINGUAL_TABLET | Freq: Three times a day (TID) | SUBLINGUAL | Status: DC

## 2012-06-12 MED ORDER — BUTAMBEN-TETRACAINE-BENZOCAINE 2-2-14 % EX AERO
INHALATION_SPRAY | CUTANEOUS | Status: DC | PRN
  Administered 2012-06-12: 2 via TOPICAL

## 2012-06-12 MED ORDER — SODIUM CHLORIDE 0.9 % IV SOLN
INTRAVENOUS | Status: DC
  Administered 2012-06-12: 500 mL via INTRAVENOUS

## 2012-06-12 MED ORDER — FENTANYL CITRATE 0.05 MG/ML IJ SOLN
INTRAMUSCULAR | Status: AC
  Filled 2012-06-12: qty 2

## 2012-06-12 MED ORDER — MIDAZOLAM HCL 10 MG/2ML IJ SOLN
INTRAMUSCULAR | Status: AC
  Filled 2012-06-12: qty 2

## 2012-06-12 MED ORDER — MIDAZOLAM HCL 10 MG/2ML IJ SOLN
INTRAMUSCULAR | Status: DC | PRN
  Administered 2012-06-12 (×2): 2 mg via INTRAVENOUS

## 2012-06-12 MED ORDER — FENTANYL CITRATE 0.05 MG/ML IJ SOLN
INTRAMUSCULAR | Status: DC | PRN
  Administered 2012-06-12 (×2): 25 ug via INTRAVENOUS

## 2012-06-12 MED ORDER — SODIUM CHLORIDE 0.9 % IV SOLN: INTRAVENOUS | Status: DC

## 2012-06-12 NOTE — Op Note (Signed)
[facility] [address] [place], [address]   ENDOSCOPY PROCEDURE REPORT  PATIENT: [name], [name]  MR#: [id number] BIRTHDATE: 1941-03-28 , 70  yrs. old GENDER: Female ENDOSCOPIST: Hart Carwin, MD REFERRED BY:  Alroy Dust, M.D. PROCEDURE DATE:  06/12/2012 PROCEDURE:  EGD, diagnostic and Maloney dilation of esophagus ASA CLASS:     Class III INDICATIONS:  dysphagia.   dysphagia to liquids and primarily to solids, last EGD 03/2009 showed watermellon stomach but no stricture. MEDICATIONS: These medications were titrated to patient response per physician's verbal order, Fentanyl-Detailed 50 mcg IV, and Versed-Detailed 4 mg IV TOPICAL ANESTHETIC: Cetacaine Spray  DESCRIPTION OF PROCEDURE: After the risks benefits and alternatives of the procedure were thoroughly explained, informed consent was obtained.  The Pentax Gastroscope [id number] endoscope was introduced through the mouth and advanced to the second portion of the duodenum. Without limitations.  The instrument was slowly withdrawn as the mucosa was fully examined.      ESOPHAGUS: The mucosa of the esophagus appeared normal.  There were no varices,There was a spasm at the LES level which presented mild resistance to the passage of the sccope. No stricture found  .Maloney dilator 16F and 44F passed without difficulty, there was no blood on the dilator.  STOMACH: there was moderately severe portal hypertensive gastropathy ( watermellon stomach), intense erythema but no signs of active bleeding, improved from last EGD      The scope was then withdrawn from the patient and the procedure completed.   Duodenum: duodenal bulb and descending duodenum were normal   COMPLICATIONS: There were no complications. ENDOSCOPIC IMPRESSION: No esophageal varices distal esophageal spasm, s/p passage of 16F and44F Maloney dilators portal hypertensive gastropathy -chronic, no signs of  bleeding   RECOMMENDATIONS: continue PPI's trial of Levson SL.125 mg prn dysphagis ( before meals) REPEAT EXAM: NO REPEAT EXAM  AT THIS POINT  eSigned:  Hart Carwin, MD 06/12/2012 8:17 AM   CC:  PATIENT NAME:  [name], [name] MR#: [id number]

## 2012-06-12 NOTE — Interval H&P Note (Signed)
History and Physical Interval Note:  06/12/2012 7:17 AM  Charlott Rakes  has presented today for surgery, with the diagnosis of dysphagia  The various methods of treatment have been discussed with the patient and family. After consideration of risks, benefits and other options for treatment, the patient has consented to  Procedure(s) (LRB) with comments: ESOPHAGOGASTRODUODENOSCOPY (EGD) (N/A) BALLOON DILATION (N/A) - ?balloon as a surgical intervention .  The patient's history has been reviewed, patient examined, no change in status, stable for surgery.  I have reviewed the patient's chart and labs.  Questions were answered to the patient's satisfaction.     [name]

## 2012-06-12 NOTE — H&P (View-Only) (Signed)
[name] 1941/01/27 MRN [id number]   History of Present Illness:  This is a 71 year old white female with autoimmune liver disease, cirrhosis and portal hypertension. 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 She had an iron infusion on August 22 when her hemoglobin dropped to 9.3. She has a history of argon laser ablation of gastric AVMs. Her last ablation was in February 2010. She is here today because of solid food dysphagia. She has been on multiple medications including Protonix 40 mg daily. She is also complaining of constipation. Her last colonoscopy October 2008 showed a tubular adenoma. Her last hemoglobin 05/26/2012 was up to 11.1. Her platelet count was 86,000, AST of 50,  ALT of 47 with alkaline phosphatase of 91. Her level of energy has been low.   Past Medical History  Diagnosis Date  . Chronic airway obstruction, not elsewhere classified   . Unspecified chronic bronchitis   . Unspecified essential hypertension   . Coronary atherosclerosis of unspecified type of vessel, native or graft   . Palpitations   . Aortic valve disorders   . Pure hypercholesterolemia   . Morbid obesity   . Esophageal reflux   . Angiodysplasia of intestine (without mention of hemorrhage)   . Diverticulosis of colon (without mention of hemorrhage)   . Benign neoplasm of colon   . Autoimmune hepatitis   . Cystitis, unspecified   . Osteoarthrosis, unspecified whether generalized or localized, unspecified site   . Osteoporosis, unspecified   . Restless legs syndrome (RLS)   . Major depressive disorder, recurrent episode, severe, specified as with psychotic behavior   . Iron deficiency anemia secondary to blood loss (chronic)   . Coarse tremors     "just in my arms"  . Carpal tunnel syndrome on both sides   . Dysrhythmia     palpitations  . CHF (congestive heart failure)   . Asthma   . Shortness of breath     "any time really"  .  Sleep apnea     "but I don't use the equipment"  . Type II diabetes mellitus   . Blood transfusion   . H/O hiatal hernia   . 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 Adenomatous colon polyp    Past Surgical History  Procedure Date  . Hemorroidectomy   . Appendectomy   . Vaginal hysterectomy   . Breast biopsy     bilateral  . Cesarean section     x 3  . Appendectomy     reports that she quit smoking about 19 years ago. Her smoking use included Cigarettes. She has a 37.5 pack-year smoking history. She has never used smokeless tobacco. She reports that she drinks alcohol. She reports that she does not use illicit drugs. family history includes Breast cancer in her maternal aunt and sister; Cervical cancer in her mother; Diabetes in her fathers and sister; Heart disease in her mother; and Kidney disease in her mother.  There is no history of Colon cancer. Allergies  Allergen Reactions  . Aspirin Other (See Comments)    Causes nosebleeds  . Penicillins Itching  . Theophylline Nausea And Vomiting        Review of Systems: Dysphagia to solids. Gastroesophageal reflux weight loss of fall 20 pounds  The remainder of the 10 point ROS is negative except as outlined in H&P   Physical Exam: General appearance  Well developed, in no distress. Overweight Eyes-  non icteric. HEENT nontraumatic, normocephalic. Mouth no lesions, tongue papillated, no cheilosis. Neck supple without adenopathy, thyroid not enlarged, no carotid bruits, no JVD. Lungs Clear to auscultation bilaterally. Cor normal S1, normal S2, regular rhythm, no murmur,  quiet precordium. Abdomen: Large protuberant soft enlarged left lobe of the liver. Normal active bowel sounds. No ascites. Prominent collateral veins. Rectal: Soft Hemoccult positive stool, large amount consistent with impaction. Extremities no pedal edema. Skin no lesions. Neurological alert and oriented x 3 no asterixis. Psychological normal mood and  affect.  Assessment and Plan:  Problem #1 Solid food dysphagia suggestive of esophageal stricture in a patient with gastroesophageal reflux. She will continue Protonix 40 mg daily. We will schedule an upper endoscopy and dilatation. We will also check for the presence of esophageal varices.  Problem #2 Chronic GI blood loss from AVMs with history of angiodysplasia of the stomach and colon.  Problem #3 History of adenomatous polyps of the colon. Her last colonoscopy was in 2008. She is at high risk for a colonoscopy prep and sedation so I would hold off on colonoscopy at this time.  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 Etiology is likely due to autoimmune disease. We will monitor liver function tests and coagulation studies.   06/05/2012 [name]

## 2012-06-15 ENCOUNTER — Encounter (HOSPITAL_COMMUNITY): Payer: Self-pay | Admitting: Internal Medicine

## 2012-07-01 ENCOUNTER — Telehealth: Payer: Self-pay | Admitting: Pulmonary Disease

## 2012-07-01 MED ORDER — FLUTICASONE-SALMETEROL 250-50 MCG/DOSE IN AEPB: 1.0000 | INHALATION_SPRAY | Freq: Two times a day (BID) | RESPIRATORY_TRACT | Status: DC

## 2012-07-01 MED ORDER — SITAGLIPTIN PHOSPHATE 100 MG PO TABS: 100.0000 mg | ORAL_TABLET | Freq: Every evening | ORAL | Status: DC

## 2012-07-01 NOTE — Telephone Encounter (Signed)
Last OV with Dr. Kriste Basque 05/26/12 -- asked to f/u in 4-6 months. Pending OV with Dr. Kriste Basque 09/01/12.  1 sample of advair 250/50 and 1 box of januvia 100 mg placed at front for pick up.  Mr. Gellner aware.

## 2012-07-06 ENCOUNTER — Other Ambulatory Visit: Payer: Self-pay | Admitting: Pulmonary Disease

## 2012-07-28 ENCOUNTER — Telehealth: Payer: Self-pay | Admitting: Pulmonary Disease

## 2012-07-28 MED ORDER — FLUTICASONE-SALMETEROL 250-50 MCG/DOSE IN AEPB: 1.0000 | INHALATION_SPRAY | Freq: Two times a day (BID) | RESPIRATORY_TRACT | Status: DC

## 2012-07-28 MED ORDER — SITAGLIPTIN PHOSPHATE 100 MG PO TABS: 100.0000 mg | ORAL_TABLET | Freq: Every evening | ORAL | Status: DC

## 2012-07-28 NOTE — Telephone Encounter (Signed)
Samples at front. Pt is aware. [name], CMA  

## 2012-08-04 ENCOUNTER — Other Ambulatory Visit: Payer: Self-pay | Admitting: Pulmonary Disease

## 2012-08-04 ENCOUNTER — Telehealth: Payer: Self-pay | Admitting: Pulmonary Disease

## 2012-08-04 MED ORDER — AZITHROMYCIN 250 MG PO TABS: ORAL_TABLET | ORAL | Status: DC

## 2012-08-04 NOTE — Telephone Encounter (Signed)
Per SN----ok to send in zpak #1  Take as directed with 2 refills, take the mucinex 600 mg  2 po bid and take the align once daily with the abx.  Called and spoke with pt and she is aware of meds sent to her pharmacy.

## 2012-08-04 NOTE — Telephone Encounter (Signed)
Spoke to pt she states that she has chest and sinus congestion. Coughing with production of green mucus. No fever as of now. Please advise.

## 2012-08-21 ENCOUNTER — Other Ambulatory Visit: Payer: Self-pay | Admitting: Pulmonary Disease

## 2012-09-01 ENCOUNTER — Encounter: Payer: Self-pay | Admitting: Pulmonary Disease

## 2012-09-01 ENCOUNTER — Ambulatory Visit (INDEPENDENT_AMBULATORY_CARE_PROVIDER_SITE_OTHER): Payer: Medicare Other | Admitting: Pulmonary Disease

## 2012-09-01 VITALS — BP 118/82 | HR 60 | Temp 99.0°F | Ht 64.0 in | Wt 209.2 lb

## 2012-09-01 DIAGNOSIS — IMO0001 Reserved for inherently not codable concepts without codable children: Secondary | ICD-10-CM

## 2012-09-01 DIAGNOSIS — E7800 Pure hypercholesterolemia, unspecified: Secondary | ICD-10-CM

## 2012-09-01 DIAGNOSIS — R1310 Dysphagia, unspecified: Secondary | ICD-10-CM

## 2012-09-01 DIAGNOSIS — D509 Iron deficiency anemia, unspecified: Secondary | ICD-10-CM

## 2012-09-01 DIAGNOSIS — E119 Type 2 diabetes mellitus without complications: Secondary | ICD-10-CM

## 2012-09-01 DIAGNOSIS — M1990 Unspecified osteoarthritis, unspecified site: Secondary | ICD-10-CM

## 2012-09-01 DIAGNOSIS — I2510 Atherosclerotic heart disease of native coronary artery without angina pectoris: Secondary | ICD-10-CM

## 2012-09-01 DIAGNOSIS — E669 Obesity, unspecified: Secondary | ICD-10-CM

## 2012-09-01 DIAGNOSIS — J42 Unspecified chronic bronchitis: Secondary | ICD-10-CM

## 2012-09-01 DIAGNOSIS — K754 Autoimmune hepatitis: Secondary | ICD-10-CM

## 2012-09-01 DIAGNOSIS — J441 Chronic obstructive pulmonary disease with (acute) exacerbation: Secondary | ICD-10-CM

## 2012-09-01 DIAGNOSIS — K5730 Diverticulosis of large intestine without perforation or abscess without bleeding: Secondary | ICD-10-CM

## 2012-09-01 DIAGNOSIS — F333 Major depressive disorder, recurrent, severe with psychotic symptoms: Secondary | ICD-10-CM

## 2012-09-01 DIAGNOSIS — D126 Benign neoplasm of colon, unspecified: Secondary | ICD-10-CM

## 2012-09-01 DIAGNOSIS — M810 Age-related osteoporosis without current pathological fracture: Secondary | ICD-10-CM

## 2012-09-01 DIAGNOSIS — I10 Essential (primary) hypertension: Secondary | ICD-10-CM

## 2012-09-01 DIAGNOSIS — K219 Gastro-esophageal reflux disease without esophagitis: Secondary | ICD-10-CM

## 2012-09-01 MED ORDER — LEVOFLOXACIN 500 MG PO TABS: 500.0000 mg | ORAL_TABLET | Freq: Every day | ORAL | Status: DC

## 2012-09-01 MED ORDER — HYDROCODONE-HOMATROPINE 5-1.5 MG/5ML PO SYRP: 5.0000 mL | ORAL_SOLUTION | Freq: Four times a day (QID) | ORAL | Status: DC | PRN

## 2012-09-01 NOTE — Progress Notes (Addendum)
Subjective:    Patient ID: [name], female    DOB: 04/14/1941, 71 y.o.   MRN: [id number]  HPI 71 y/o WF here for a follow up visit... she has mult med problems as noted below>   ~  October 25, 2011:  4mo ROV & [name] is w/o complaints today; SEE 06/12/11 OV for prev note string...  We rechecked labs today (see below), no changes made...    COPD> on Home O2, Advair250, NEBS, Mucinex; remote smoker quit yrs ago; known obstructive & restrictive lung dis; she has lost 10# in the interval & feels as though her breathing is sl improved as a result; asked to continue the same meds for now.     HBP> on Aten50, Lasix40Bid, K20; BP= 120/72 & she has mult somatic complaints but no specific CP, palpit, ch in SOB or edema; continue low sodium 7 wt reduction.     CAD & AS/AI> known non-obstructive CAD; hx atypCP/ CWP; no angina & she remains way too sedentary; discussed risk factor reduction strategy & she has f/u Cards DrCooper soon.    Chol> on Simva40; not really on diet & we discussed this; last FLP 10/12 showed TChol 110, TG 116, HDL 34, LDL 53; continue same, needs exercise!    DM> on Lantus20, Metform1000Bid, Glimep4, Januvia100; needs much better diet & exercise program! Not checking BS at home; BS=276, A1c=6.8 (improved from prev) & Pharm indicates that she is filling pretty regularly...    Obesity> way too sedentary, ?on diet & she has finally lost a little wt (down 10# to 215# today); we discussed diet, exercise, wt reduction strategies...    {GERD, Angiodysplasia w/ chronic GI blood loss...    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 We will continue  to check labs every 2mo & ROV in 4mo...    DJD/ FM/ ?osteopenia/ VitD defic> she has mult somatic complaints & takes Tylenol prn; on VitD 50K weekly...    RLS> on Requip4Qhs & Sinemet25/100Bid from DrDohmeier w/ fair control of symptoms...    Anxiety/ Depression/ Psyche> on RX per Psychiatry DrHejazi (we don't have notes from him) & she does not know her meds, didn't bring med bottles or med list!  As best we can tell she is on Nuvigil, Pristiq, Wellbutrin, Risperdal, Seroquel, Xanax> all from Psychiatry & she is reminded to bring all bottles to every visit... LABS 3/13:  Chems- ok x BS=276 A1c=6.8;  CBC- Hg=11.2 Fe=71 WBC=4.2K, Plat=75K...  ~  Jan 13, 2012:  3mo ROV & post hosp visit> Hosp 3/29 - 11/26/11 w/ COPD exac & mult chronic medical issues; treated w/ Pred & Zithromax & improved;  disch on Oxygen 2L/min, Advair250Bid, Pred taper, Proair prn;  Now improved & approaching baseline, she has completed the Pred taper but noted incr SOB, cough, & yellow sputum==> she wants additional antibiotic rx & we wrote for Avelox x7d...  She has mult additional somatic complaints> dizzy, abd discomfort, RLS, etc; overall they note fewer good days and more bad days; he daugh has been in the hosp & it wears her out to go   for a visit...     Hx DM on Lantus plus Metformin500mg- 2Bid, Glimepiride 4mg Qam, & Januvia 100mg/d> BS=191, A1c=6.9 and she will maintain her current regimen + diet & try to incr exercise...    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    We reviewed her prob list, meds, xrays and labs> see below>>  LABS 5/13:  FLP- at goals on Simva40 x HDL=32;  Chems- ok x BS=191 A1c=6.9 LFTs=sl elev;  CBC- pancytopenic w/ Hg=9.8 Fe=53;  TSH=3.50  ~  February 24, 2012:  6wk ROV & [name] has experienced slowly incr blood sugars requiring incr Lantus  doses- now at 40u daily & BS checks still in the 300s; in addition she is c/o worsening RLS- prev evaluated by DrDohmeier, Neurology & she was on Requip & Sinemet w/ better control but she stopped the Requip on her own some time ago w/ recurrent RLS symptoms...    Breathing is ok; BP controlled on Aten & Lasix; Chol prev ok on Simva40;  She remains on 6 psychotopic meds from Psychiatry, DrHejazi but she never brings bottles & we don't have notes..    We reviewed prob list, meds, xrays and labs> see below>> LABS 7/13:  Chems- ok x BS=339;  A1c=7.4 REC to slowly incr Lantus by 5u increments until FBS <150...  ~  May 26, 2012:  3mo ROV & [name] relates the following issues since last visit>>    She saw DrDohmeier for Neuro for f/u Tremors & she made several adjustments in her Psyche meds- stopped the Risperdal & decr the Seroquel to 100mg/d; pt notes that tremors are better but still signif (also on SinemetCR-Bid) & she claims "withdrawal" from the Risperdal cessation w/ fallx2/ "messed up my liver"/ "sugars went crazy"...  Initiation of Requip really has helped the RLS...    ?Labs by Neuro showed low iron by pt report (we don't have notes from DrDohmeier) & DrDBrodie ordered Iron infusion for pt- done 04/22/12 w/ 1000mg Iron Dextran infused at day surg; she is due for f/u labs today> Hg=11.1  Fe=76    She is c/o choking while eating/ swallowing & she has f/u appt w/ DrDBrodie to see about repeat EGD w/ dilatation (last EGD 2011 showed normal esoph, GAVE syndrome w/ argon laser ablation)...    DM control "went crazy" she says> she has cut the Lantus to 25u/d, & still taking the Metform500-2Bid, Glim4/d, Januv100/d; BS at home varies betw 68-242 & labs today showed BS=164  A1c= 5.0     We reviewed prob list, meds, xrays and labs> see below for updates >> OK Flu vaccine today... LABS 10/13:  Chems- ok x BS=164 A1c=5.0;  CBC- ok x Hg=11.1 WBC=4.2 Plat=86 Fe=76 (23%)  ~  September 01, 2012:  3mo ROV & [name]  is c/o a URI "cold in my chest" w/ cough, discolored phlegm "diff colors at diff times", chest congestion, etc; she notes- tired all the time, HH is bothering her, BS at home betw 170-200 range... We reviewed the following medical problems during today's office visit>>      COPD> on Home O2, Advair250, NEBS, Mucinex; remote smoker quit yrs ago; known obstructive & restrictive lung dis; she had ZPak recently but persists w/ c/o discolored phlegm, no f/c/s, vague chest discomfort; last CXR 3/13 showed linear scarring left mid lung, otherw   clear; CTA 3/13 was neg for PE & showed some atelec, coronary calcif, multilevel spondylosis, & cirrhosis of the liver; she declined f/u film today- we decided to treat w/ Levaquin, Mucinex, Fluids, Hycodan...    HBP> on Aten50, Lasix40, K20; BP= 118/82 & she has mult somatic complaints but no specific CP, palpit, ch in SOB or edema; continue low sodium & wt reduction.     CAD & mild AS/AI> known non-obstructive CAD; hx atypCP/ CWP; no angina & she remains way too sedentary; discussed risk factor reduction strategy & she saw DrCooper 7/13> stable, no angina, too sedentary; f/u 2DEcho w/ mild LVH, norm LVF w/ EF=55-60% & norm wall motion, mils AS & AI, Gr2DD...    Chol> on Simva40; not really on diet & we discussed this; last FLP 5/13 showed TChol 94, TG 130, HDL 32, LDL 36; continue same, needs exercise!    DM> on Lantus25, Metform1000Bid, Glimep2, Januvia100; needs much better diet & exercise program! Not checking BS at home; labs from 10/13 showed BS=164, A1c=5.0 so we decreased the Glimep from 4mg to 2mg each AM...    Obesity> way too sedentary, ?on diet & her wt is up to 209# today; we discussed diet, exercise, wt reduction strategies...    << Chronic GI problems followed by DrDBrodie >>    {GERD, Angiodysplasia w/ chronic GI blood loss >> see below... 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 We will continue to check labs every 2mo w/ Iron infusions of Hg is reduced & ROV in 4mo...    DJD/ FM/ ?osteopenia/ VitD defic> she has mult somatic complaints & takes Tylenol prn; on VitD 50K weekly...    RLS> on Requip4Qhs & Sinemet25/100Bid from DrDohmeier w/ fair control of symptoms- her note is reviewed...    Anxiety/ Depression/ Psyche> on RX per Psychiatry DrHejazi (we don't have notes from him) & she does not know her meds, didn't bring med bottles or med list!  As best we can tell she is on Nuvigil, Pristiq, Wellbutrin, Risperdal, Seroquel, Xanax> all from Psychiatry & she is reminded to bring all bottles to every visit... We reviewed prob list, meds, xrays and labs> see below for updates >>  LABS 1/14:  CBC- Hg=7.0 (down 4pts) Hct=21.9 Fe=51 (11%sat) B12=623, Protime=11.6/ 1.1, AFP=pending; Abd sonar per DrDBrodie- pending...          Problem List:  COPD (ICD-496) & BRONCHITIS, RECURRENT (ICD-491.9) - Ex-smoker w/ underlying COPD on home O2, ADVAIR 250Bid, ALBUTEROL (via nebs or MDI for Prn use), & MUCINEX 1-2Bid... she has combined obstructive AND restrictive disease (based on her obesity) w/ diet (weight reduction) + exercise strongly recommended to the pt...  ~  CTChest 5/06 = neg. ~  baseline CXR w/ bilat LL scarring, NAD.... last CXR 3/09 = unchanged, NAD. ~  PFTs 3/09 showed FVC= 2.47 (83%), FEV1= 1.65 (69%), %1sec= 67, mid-flows= 28%pred. ~  CXR 4/12 showed bilat scarring, NAD... ~  PFT 10/12 showed FVC= 2.28 (77%), FEV1= 1.46 (64%), %1sec= 64, mid-flows= 31% pred. ~  CXR 3/13 showed normal heart size, prom hilar regions, no infiltrates etc, NAD... ~  CTAngio Chest 3/13 showed neg for PE- scarring at lung bases, no adenopathy or lung lesions,   calcified coronaries, multilevel spondylosis in TSpine, cirrhosis in liver  HYPERTENSION  (ICD-401.9) - on ATENOLOL 50mg/d,  LASIX 40mg/d & K20/d...  ~  10/11:  BP=116/78 and she has mult somatic complaints but all are diminished compared to prev visits; denies bad HA's, CP, palpit, syncope, edema, etc... ~  4/12:  BP= 140/60 and she persists w/ mult somatic complaints... ~  10/12:  BP= 122/74 & she has mult somatic complaints; needs better low sodium, calorie restricted diet & wt reduction... ~  3/13:  BP= 120/72 & she is unchanged symptomatically;  EKG 3/13 showed NSR, rate68, LAD, late transition, NSSTTWA... ~  7/13:  BP= 110/62 & she persists w/ mult somatic complaints... ~  10/13:  BP= 118/60 & she remains clinically stable...  CAD (ICD-414.00) - Hosp at [facility] 4/09 w/ CP... cath showed non-obstructive CAD... no CP, palpit, etc (but she is way too sedentary). ~  cath 4/09 by DrCooper showed normLMAIN, 20% midLAD, norm CIRC, 20-30% proxRCA, EF= 50-55%... ~  EKG 1/11 showed SBrady, rate54, ?septal infarct, NSSTTWA, NAD... ~  EKG 3/13 showed NSR, rate68, LAD, late transition, NSSTTWA... ~  CTAngio Chest 3/13 showed calcif coronaries as noted... ~  DrCooper 7/13> stable, no angina, too sedentary; f/u 2DEcho w/ mild LVH, norm LVF w/ EF=55-60% & norm wall motion, mils AS & AI, Gr2DD...  PALPITATIONS (ICD-785.1) - eval by DrKlein w/ 7 beats WT on monitor... he changed Lisinopril to BBlocker (currently ATENOLOL 50mg/d) and improved... ~  she saw DrCopper for Cards f/u 1/11- palpit well controlled on Atenolol; hx non-obstructive CAD & good LVF; no change in Rx.  AORTIC STENOSIS (ICD-424.1) - she has mild Ao Valve disease w/ AS/ AI... followed by DrCooper. ~  2DEcho 11/06 showed mild ca++ AoV w/ mild AS, norm LVF w/ EF=55-65% ~  repeat 2DEcho 1/09 showed similar mild ca++ AoV w/ mild AS, mild AI, no regional wall motion abn, norm LVf w/ EF= 60%... NOTE: est PAsys= 35... ~  repeat 2DEcho 1/10 showed norm LVF w/ EF= 55-60%, no regional wall motion abn, mild DD, mod calcif AoV & mild  reduced leaflet excursion & mild AI... ~  Repeat 2DEcho 7/13 showed norm LVF w/ EF=55-65%, no regional wall motion abn, Gr2DD, mildAS, mildAI, mild LAdil... ~  DrCooper 7/13> stable, no angina, too sedentary; f/u 2DEcho w/ mild LVH, norm LVF w/ EF=55-60% & norm wall motion, mils AS & AI, Gr2DD...  HYPERCHOLESTEROLEMIA (ICD-272.0) - on SIMVASTATIN 40mg/d... ~  FLP 6/09 showed TChol 107, TG 69, HDL 35, LDL 59 ~  FLP 3/10 showed TChol 128, TG 125, HDL 33, LDL 70 ~  FLP 12/10 showed TChol 98, TG 64, HDL 37, LDL 48... continue same. ~  FLP 10/11 showed TChol 124, TG 105, HDL 37, LDL 66 ~  FLP 4/12 showed TChol 122, TG 95, HDL 37, LDL 66 ~  FLP 10/12 on Simva40 showed TChol 110, TG 116, HDL 34, LDL 53 ~  FLP 5/13 on Simva40 showed TChol 94, TG 130, HDL 32, LDL 36  DM (ICD-250.00) - hx irreg control, not really on diet... on LANTUS 25u daily,  METFORMIN 500mg-2Bid,  GLIMEPIRIDE 4mg/d,  JANUVIA 100mg/d...  ~  labs 10/08 BS=198, HgA1C=7.2 ~  labs 1/09 showed BS=119, HgA1c=6.0 ~  labs 6/09 showed BS= 248, HgA1c= 7.0... discussed need for starting insulin if she can't get on track and get wt down... ~  labs 9/09 showed BS= 236, HgA1c= 7.6... rec- start Lantus 5u/d... grad increased to 10u. ~  labs   11/09 showed BS= 166, HgA1c= 6.0... rec- great! keep same. ~  labs 3/10 showed BS= 169, A1c= 6.8 ~  labs 12/10 showed BS= 149, A1c= 5.8, Umicroalb= neg. ~  labs 10/11 showed BS= 215, A1c= 7.1... rec> better diet, get wt down. ~  Labs 4/12 showed BS= 172, A1c=5.8 ~  Labs 10/12 showed BS= 245, A1c= 8.1... rec to take meds every day; incr Lantus dose 10==> 20u/d... ~  Labs 3/13 on Lantus20+3meds showed BS= 276, A1c= 6.8 (improved)... ~  4/13:  No diabetic retinopathy per optometrist at [facility]... ~  Labs 5/13 on Lantus20+3meds showed BS= 191, A1c= 6.9 ==> subsequently BS incr & Lantus incr ~  Labs 7/13 on Lantus40+3meds showed BS= 339, A1c= 7.4... ?why control is off, rec move shots around & incr by  5u/wk til BS<200. ~  Labs 10/13 on Lantus25+3meds showed BS= 164, A1c= 5.0... Too tight & rec to decr Lantus20 & decr Glimep4mg to 1/2 tab...  MORBID OBESITY (ICD-278.01) - not really on diet or exercise program... ~  weight 6/09 = 223# ~  weight 11/09 = 228# ~  weight 3/10 = 225# ~  weight 6/10 = 217# ~  weight 9/10 = 213# ~  weight 12/10 = 224# ~  weight 2/11 = 216# ~  weight 10/11 = 230# ~  Weight 4/12 = 222# ~  Weight 10/12 = 225# ~  Weight 3/13 = 215#...  great job, keep it up... 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  ~  EGD  2/06 showed angiodysplasia & mult gastric pseudopolyps, watermelon stomach improved from 2003. ~  recent f/u EGD 9/09 showed chr gastritis, no watermelon stomach & improved from prev... ~  recurrent anemia 12/10 w/ f/u EGD 2/11 showing gastric antral avm's- s/p argon laser ablation, no varicies etc... 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 ~  5/13:  She is not on PPI and c/o some abd discomfort- rec starting PROTONIX 40mg/d... ~  She saw DrDBrodie 10/13 w/ c/o dysphagia + f/u of her autoimmune liver dis, portal HTN, cirrhosis, "watermelon stomach" & hx argon laser ablation of gastric AVMs (chr GI blood loss w/ periodic Fe infusions- last 8/13); EGD 10/13 showed norm esoph mucosa, no varicies, spasm at the LES but no stricture found; dilated to 50F; mod severe portal hypertensive gastropathy in stomach, no active bleeding; REC> continue PPI, try Levsin...  DIVERTICULOSIS OF COLON (ICD-562.10) & POLYP,  COLON (ICD-211.3) -  ~  last colonoscopy 10/08 w/ adenomatous polyp removed, and angiodysplasia without hemorrhage ~  CT Abd 10/08 showed Cirrhosis, borderline splenomeg, sl pancreatic atrophy & duct dil, sm umbil hernia, DJD sp, NAD... ~  10/13: she had f/u drDBrodie> last colonoscopy was 2008, she felt pt was too high risk for prep & sedation for a f/u colonoscopy & they decided to hold off...  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 ~  LFTs 11/09 = WNL ~  LFTs 3/10 =   WNL ~  LFTs 12/10 = WNL ~  LFTs 10/11 showed SGOT=39 (0-37), SGPT=35 (0-35) ~  LFTs 4/12 showed SGOT=39, SGPT=16 ~  LFTs 10/12 = WNL ~  LFTs 5/13 showed AlkPhos=118, SGOT=46, SGPT=44  Hx of CYSTITIS, RECURRENT (ICD-595.9) - hx mult UTI's w/ eval by DrGrapey for Urology... ~  10/11:  presents w/ dysuria & UTI Rx w/ Cipro... ~  12/11:  She had f/u DrGrapey...  OSTEOARTHRITIS (ICD-715.90)  FIBROMYALGIA (ICD-729.1) - she persists w/ mult somatic complaints...  ? of OSTEOPOROSIS (ICD-733.00) - she had normal BMD in 2001 at [place]..  RESTLESS LEGS SYNDROME (ICD-333.94) - she prev weaned off the Requip & remains on SINEMET-CR 25-100Bid... ~  9/10: now c/o recurrent restless leg symptoms... restart REQUIP 2mg & titrate up. ~  12/10: persistant symptoms on 2mgQhs... rec to incr to 4mg... ~  2/11: she reports resolution of RLS on 4mg Requip Qhs, resting well now. ~  12/11:  Recurrent RLS symptoms despite the Requip & seen by DrDohmeier w/ addition of Sinemet 25/100 Bid... ~  2012:  Stable on Requip4mg & Sinemet 25/100 Bid... ~  7/13:  She is once again c/o incr RLS symptoms & rec to restart REQUIP 4mg/d...  ~  8/13: she had f/u visit w/ DrDohmeier> RLS, tremors, & insomnia; Seroquel helped for awhile, she is working w/ drHejazi to help her problems...  DYSTHYMIA (ICD-300.4) - Hx depression w/ psychotic features and hosp  2/09 at [facility] w/ delusions of pedunculosis> she was prev treated by Dr. Amy Singleton & DrSuttenfield for counselling...  Adm to [facility] in May09 by DrBarbSmith w/ depression and meds adjusted (see DC summary)...  Now followed by DrHejazi and [name] at [facility]- she was re-admitted 10/09 and meds changed to RISPERDAL, PRISTIQUE, BUPROPION, SEROQUEL, XANAX & Nuvigil (Armodafinil)...  husb indicates that she is much better at present & continues outpt Rx... ~  MRI/ MRA Brain 2/08 showed sm vessel dis & atherosclerotic changes in post circ... ~  9/10: pt indicates that she is off the Trazodone... DrHejazi added Wellbutrin ?for her RLS? ~  2011:  pt continues regular f/u w/ Psychiatry on numerous meds w/ freq med adjustments; she is requested to get notes from DrHejazi to our attention & bring all med bottles to her OVs for review... ~  2012:  We have not received any notes from Psyche; pt has not complied w/ our request for med bottles to be brought to each OV... ~  2013:  We still don't have accurate list of her current meds as we have not received Psyche notes & she NEVER brings meds to office despite our requests...  IRON DEFICIENCY ANEMIA SECONDARY TO BLOOD LOSS (ICD-280.0) >> from angiodysplasia in stomach/ GI tract PANCYTOPENIA due to Cirrhosis >>    She has chronic iron deficiency anemia requiring periodic iron infusions- last 04/26/08 w/ 1,200mg IronDextran per DrBrodie... ~  labs 04/18/08 by DrBrodie showed Hg= 10.4, Fe= 52... she ordered 1,200mg IV Iron- given 04/26/08. ~  labs 05/19/08 showed Hg= 11.3, Fe= 117... ~  labs 11/09 showed Hg= 12.0... ~  labs 3/10 showed Hg= 12.3, Fe= 79 ~  labs 6/10 showed Hg= 11.5, Fe= 73 ~  labs 12/10 showed Hg= 7.2, Fe= 28 (6%sat)... given 2uPC & IV Fe per DrBrodie in Jan2012. ~  labs 2/11 showed Hg= 11.8, Fe= 95... pt indicates that DrBrodie will be following labs going forward... ~  labs 6/11 showed Hg= 11.2 ~  labs 9/11  showed Hg= 11.1, Fe=   106 ~  Labs 4/12 showed Hg=11.0, Fe=118 ~  Labs 10/12 showed Hg=9.5, Fe=35 (7%sat)... We will arrange for outpt infusion 1000mg Fe... ~  Labs 12/12 showed Hg=11.4, Fe=92 (26%sat) ~  Labs 3/13 showed Hg=11.2, Fe=71 (17%sat) ~  Labs 5/13 showed Hg=9.8, Fe=53 (13%sat) ~  04/22/12:  She was given 1000mg IV Iron Dextran by DrDBrodie... ~  Labs 10/13 showed Hg=11.1, Fe=76 (23%) ~  Labs 1/14 showed Hg=7.0 (down 4pts) Hct=21.9 Fe=51 (11%sat) B12=623, Protime=11.6/ 1.1, AFP=pending; Abd sonar per DrDBrodie- pending...     Past Surgical History  Procedure Date  . Hemorroidectomy   . Appendectomy   . Vaginal hysterectomy   . Breast biopsy     bilateral  . Cesarean section     x 3  . Appendectomy   . Esophagogastroduodenoscopy 06/12/2012    Procedure: ESOPHAGOGASTRODUODENOSCOPY (EGD);  Surgeon: Dora M Brodie, MD;  Location: [facility];  Service: Endoscopy;  Laterality: N/A;  . Balloon dilation 06/12/2012    Procedure: BALLOON DILATION;  Surgeon: Dora M Brodie, MD;  Location: [facility];  Service: Endoscopy;  Laterality: N/A;  ?balloon    Outpatient Encounter Prescriptions as of 09/01/2012  Medication Sig Dispense Refill  . albuterol (PROAIR HFA) 108 (90 BASE) MCG/ACT inhaler Inhale 2 puffs into the lungs every 6 (six) hours as needed.        . ALPRAZolam (XANAX) 1 MG tablet Take 1 mg by mouth at bedtime.       . Armodafinil (NUVIGIL) 150 MG tablet Take 150 mg by mouth daily.      . atenolol (TENORMIN) 50 MG tablet TAKE 1 TABLET BY MOUTH EVERY DAY  30 tablet  10  . b complex vitamins tablet Take 1 tablet by mouth daily.       . B-D ULTRAFINE III SHORT PEN 31G X 8 MM MISC USE AS DIRECTED  100 each  6  . buPROPion (WELLBUTRIN XL) 150 MG 24 hr tablet Take 150 mg by mouth daily.      . carbidopa-levodopa (SINEMET CR) 25-100 MG per tablet Take 1 tablet by mouth 2 (two) times daily. 1 at lunch and 1 at dinner      . desvenlafaxine (PRISTIQ) 100 MG 24 hr tablet Take 100 mg by  mouth daily.      . dextromethorphan-guaiFENesin (MUCINEX DM) 30-600 MG per 12 hr tablet Take 1-2 tablets by mouth every 12 (twelve) hours as needed.      . Fluticasone-Salmeterol (ADVAIR DISKUS) 250-50 MCG/DOSE AEPB Inhale 1 puff into the lungs 2 (two) times daily.  14 each  0  . furosemide (LASIX) 40 MG tablet Take 40 mg by mouth daily.      . glimepiride (AMARYL) 4 MG tablet Take 1/2 tablet by mouth every morning  30 tablet  6  . hyoscyamine (LEVSIN/SL) 0.125 MG SL tablet Place 1 tablet (0.125 mg total) under the tongue 3 (three) times daily before meals.  90 tablet  3  . insulin glargine (LANTUS) 100 UNIT/ML injection Inject 25 Units into the skin daily.      . JANUVIA 100 MG tablet TAKE 1 TABLET EVERY DAY AS DIRECTED  30 tablet  4  . metFORMIN (GLUCOPHAGE) 500 MG tablet TAKE 2 TABLETS BY MOUTH TWICE A DAY  120 tablet  4  . Multiple Vitamins-Minerals (CENTRUM SILVER PO) Take 1 tablet by mouth daily.       . pantoprazole (PROTONIX) 40 MG tablet Take 1 tablet (40 mg total) by mouth daily.  90 tablet    3  . polyethylene glycol powder (GLYCOLAX/MIRALAX) powder Take 17 g by mouth daily.  527 g  2  . potassium chloride SA (K-DUR,KLOR-CON) 20 MEQ tablet Take 20 mEq by mouth daily.      . promethazine (PHENERGAN) 25 MG tablet Take 25 mg by mouth every 6 (six) hours as needed.        . QUEtiapine (SEROQUEL) 100 MG tablet Take 100 mg by mouth at bedtime.       . rOPINIRole (REQUIP) 4 MG tablet Take 1 tablet (4 mg total) by mouth at bedtime.  30 tablet  6  . simvastatin (ZOCOR) 40 MG tablet TAKE 1 TABLET BY MOUTH AT BEDTIME FOR CHOLESTEROL  30 tablet  6  . sitaGLIPtin (JANUVIA) 100 MG tablet Take 1 tablet (100 mg total) by mouth every evening.  14 tablet  0  . Vitamin D, Ergocalciferol, (DRISDOL) 50000 UNITS CAPS Take 1 capsule (50,000 Units total) by mouth every 7 (seven) days. Take on friday  5 capsule  11  . [DISCONTINUED] insulin glargine (LANTUS) 100 UNIT/ML injection Inject 20 Units into the skin  daily.  10 mL  6  . [DISCONTINUED] azithromycin (ZITHROMAX) 250 MG tablet Take as directed  6 each  2  . [DISCONTINUED] hyoscyamine (LEVSIN/SL) 0.125 MG SL tablet Place 1 tablet (0.125 mg total) under the tongue 3 (three) times daily before meals.  90 tablet  3  . [DISCONTINUED] KLOR-CON M20 20 MEQ tablet TAKE 1 TABLET TWICE DAILY  60 tablet  11    Allergies  Allergen Reactions  . Aspirin Other (See Comments)    Causes nosebleeds  . Penicillins Itching  . Theophylline Nausea And Vomiting    Current Medications, Allergies, Past Medical History, Past Surgical History, Family History, and Social History were reviewed in [facility] electronic medical record.    Review of Systems         See HPI - all other systems neg except as noted...      The patient complains of weight gain, dyspnea on exertion, muscle weakness, difficulty walking, and depression.  The patient denies anorexia, fever, weight loss, vision loss, decreased hearing, hoarseness, chest pain, syncope, peripheral edema, prolonged cough, headaches, hemoptysis, abdominal pain, melena, hematochezia, severe indigestion/heartburn, hematuria, incontinence, suspicious skin lesions, transient blindness, unusual weight change, abnormal bleeding, enlarged lymph nodes, and angioedema.     Objective:   Physical Exam     WD, Obese, 71 y/o WF in NAD... she is chr ill appearing... GENERAL:  Alert & oriented x 3... HEENT:  [place]/AT, pale conjuct, EOM-wnl, PERRLA, EACs-clear, TMs-wnl, NOSE-clear, THROAT-clear w/ dry MMs... NECK:  Supple w/ fairROM; no JVD; normal carotid impulses w/o bruits; no thyromegaly or nodules palpated; no lymphadenopathy. CHEST:  Clear to P & A; without wheezes/ rales/ or rhonchi heard... HEART:  Regular Rhythm;  gr 1/6 SEM without rubs or gallops... ABDOMEN:  Obese, soft & nontender w/ panniculus; normal bowel sounds; no organomegaly or masses detected. EXT: without deformities, mod arthritic changes; no varicose  veins/ +venous insuffic/ tr edema. NEURO:  CN's intact;  no focal neuro deficits... DERM:  No lesions noted; no rash, pale complexion.  RADIOLOGY DATA:  Reviewed in the EPIC EMR & discussed w/ the patient...  LABORATORY DATA:  Reviewed in the EPIC EMR & discussed w/ the patient...   Assessment & Plan:    COPD>  Combined obstructive & restrictive lung dis; PFT showed sl deterioration in FEV1; multifactorial dyspnea including stress; REC> continue Advair, NEBS, O2, Mucinex,   etc; get on diet/ exercise/ get weight down; continue [facility] f/u for help w/ anxiety...  HBP>  Controlled on low dose BBlocker, diuretic> continue same & work on weight reduction...  CAD>  Known nonobstructive dis, way too sedentary> we discussed diet + exercise...  AS>  Stable w/o angina, syncope, etc; she continues to f/u w/ DrCooper for Cards...  CHOL>  Stable on the Simva40...  DM>  Control is volatile & she is asked to move shots around etc;  Continue home monitoring & regular f/u here...  OBESITY>  Reviewed diet + exercise needed...  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  Autoimmune hepatitis>  Also managed by DrBrodie, off the Immuran at present... She also has pancytopenia related to her cirrhosis.  RLS>  Notes from DrDohmeier reviewed> on Sinemet + needs to restart Requip...  Dysthymia>  followed by Psyche on mult meds...  Anemia>  Last received 1000mg IV Iron Aug2013 & we continue to monitor Hg & Fe- we will continue labs Q8wks...   Patient's Medications  New Prescriptions   HYDROCODONE-HOMATROPINE (HYCODAN) 5-1.5 MG/5ML SYRUP    Take 5 mLs by mouth every 6 (six) hours as needed for cough.   LEVOFLOXACIN (LEVAQUIN) 500 MG TABLET    Take 1 tablet (500 mg total) by mouth daily.  Previous Medications   ALBUTEROL (PROAIR HFA) 108 (90 BASE) MCG/ACT INHALER    Inhale 2 puffs into the lungs every 6 (six) hours as needed.      ALPRAZOLAM (XANAX) 1 MG TABLET    Take 1 mg by mouth at bedtime.    ARMODAFINIL (NUVIGIL) 150 MG TABLET    Take 150 mg by mouth daily.   ATENOLOL (TENORMIN) 50 MG TABLET    TAKE 1 TABLET BY MOUTH EVERY DAY   B COMPLEX VITAMINS TABLET    Take 1 tablet by mouth daily.    B-D ULTRAFINE III SHORT PEN 31G X 8 MM MISC    USE AS DIRECTED   BUPROPION (WELLBUTRIN XL) 150 MG 24 HR TABLET    Take 150 mg by mouth daily.   CARBIDOPA-LEVODOPA (SINEMET CR) 25-100 MG PER TABLET    Take 1 tablet by mouth 2 (two) times daily. 1 at lunch and 1 at dinner   DESVENLAFAXINE (PRISTIQ) 100 MG 24 HR TABLET    Take 100 mg by mouth daily.   DEXTROMETHORPHAN-GUAIFENESIN (MUCINEX DM) 30-600 MG PER 12 HR TABLET    Take 1-2 tablets by mouth every 12 (twelve) hours as needed.   FLUTICASONE-SALMETEROL (ADVAIR DISKUS) 250-50 MCG/DOSE AEPB    Inhale 1 puff into the lungs 2 (two) times daily.   FUROSEMIDE (LASIX) 40 MG TABLET    Take 40 mg by mouth daily.   GLIMEPIRIDE (AMARYL) 4 MG TABLET    Take 1/2 tablet by mouth every morning   HYOSCYAMINE (LEVSIN/SL) 0.125 MG SL TABLET    Place 1 tablet (0.125 mg total) under the tongue 3 (three) times daily before meals.   JANUVIA 100 MG TABLET    TAKE 1 TABLET EVERY DAY AS DIRECTED   METFORMIN (GLUCOPHAGE) 500 MG TABLET    TAKE 2 TABLETS BY MOUTH TWICE A DAY   MULTIPLE VITAMINS-MINERALS (CENTRUM SILVER PO)    Take 1 tablet by mouth daily.    PANTOPRAZOLE (PROTONIX) 40 MG TABLET    Take 1 tablet (40 mg total) by mouth daily.   POLYETHYLENE GLYCOL POWDER (GLYCOLAX/MIRALAX) POWDER    Take 17   g by mouth daily.   POTASSIUM CHLORIDE SA (K-DUR,KLOR-CON) 20 MEQ TABLET    Take 20 mEq by mouth daily.   PROMETHAZINE (PHENERGAN) 25 MG TABLET    Take 25 mg by mouth every 6 (six) hours as needed.     QUETIAPINE (SEROQUEL) 100 MG TABLET    Take 100 mg by mouth at bedtime.    ROPINIROLE (REQUIP) 4 MG TABLET    Take 1 tablet (4 mg total) by mouth at bedtime.   SIMVASTATIN (ZOCOR) 40 MG TABLET    TAKE 1  TABLET BY MOUTH AT BEDTIME FOR CHOLESTEROL   SITAGLIPTIN (JANUVIA) 100 MG TABLET    Take 1 tablet (100 mg total) by mouth every evening.   VITAMIN D, ERGOCALCIFEROL, (DRISDOL) 50000 UNITS CAPS    Take 1 capsule (50,000 Units total) by mouth every 7 (seven) days. Take on friday  Modified Medications   Modified Medication Previous Medication   INSULIN GLARGINE (LANTUS) 100 UNIT/ML INJECTION insulin glargine (LANTUS) 100 UNIT/ML injection      Inject 25 Units into the skin daily.    Inject 20 Units into the skin daily.  Discontinued Medications   AZITHROMYCIN (ZITHROMAX) 250 MG TABLET    Take as directed   HYOSCYAMINE (LEVSIN/SL) 0.125 MG SL TABLET    Place 1 tablet (0.125 mg total) under the tongue 3 (three) times daily before meals.   KLOR-CON M20 20 MEQ TABLET    TAKE 1 TABLET TWICE DAILY   

## 2012-09-01 NOTE — Patient Instructions (Addendum)
Today we updated your med list in our EPIC system...    Continue your current medications the same...  For your Bronchitis>     We have prescribed LEVAQUIN 500mg - take one tab daily til gone...    You may also use the HYCODAN cough syrup- one tsp every 6H as needed for the cough...  Call for any questions...  Let's plan a follow up visit in 4 months.Marland KitchenMarland Kitchen

## 2012-09-03 ENCOUNTER — Telehealth: Payer: Self-pay

## 2012-09-03 ENCOUNTER — Other Ambulatory Visit (INDEPENDENT_AMBULATORY_CARE_PROVIDER_SITE_OTHER): Payer: Medicare Other

## 2012-09-03 DIAGNOSIS — D509 Iron deficiency anemia, unspecified: Secondary | ICD-10-CM

## 2012-09-03 DIAGNOSIS — K7460 Unspecified cirrhosis of liver: Secondary | ICD-10-CM

## 2012-09-03 LAB — CBC WITH DIFFERENTIAL/PLATELET
Basophils Relative: 0.4 % (ref 0.0–3.0)
Eosinophils Relative: 2.7 % (ref 0.0–5.0)
Lymphocytes Relative: 11 % — ABNORMAL LOW (ref 12.0–46.0)
Neutrophils Relative %: 76.3 % (ref 43.0–77.0)
Platelets: 97 10*3/uL — ABNORMAL LOW (ref 150.0–400.0)
RBC: 2.51 Mil/uL — ABNORMAL LOW (ref 3.87–5.11)
WBC: 3.7 10*3/uL — ABNORMAL LOW (ref 4.5–10.5)

## 2012-09-03 NOTE — Telephone Encounter (Signed)
[name] from the lab called at 5:03 with a critical hemoglobin of 7.0 and Hct 21.9.  Last Hemoglobin from October was 11.1.  I called and spoke with the patient and her husband.  She is feeling dizzy and a little SOB, but this is unchanged for the last month or so.  I did discuss with Amy Esterwood PA on call this pm for [facility].  Will have Dr. Juanda Chance review in the am.  These labs appear to have been ordered by Dr. Kriste Basque at his office visit on 09/01/12 but routed to Dr. Juanda Chance.  I will send a copy of the note to him also.  Dr. Juanda Chance please review the labs and advise next step.  Would you like her to be seen in the office tomorrow with you or an extender?

## 2012-09-03 NOTE — Telephone Encounter (Signed)
She has cirrhosis and pancytopenia. She also has [place] stomach requiring laser ablation of avm's in the past but the most recent EGD in 05/2012 did not show active bleeding. Please check [email or web address] for follow up cirrhosis and splenomegaly and check AFP, PT,B12 and Iron studies. We will then decide if she would benefit from a blood transfusion

## 2012-09-04 ENCOUNTER — Other Ambulatory Visit (INDEPENDENT_AMBULATORY_CARE_PROVIDER_SITE_OTHER): Payer: Medicare Other

## 2012-09-04 DIAGNOSIS — K7460 Unspecified cirrhosis of liver: Secondary | ICD-10-CM

## 2012-09-04 LAB — IBC PANEL
Iron: 51 ug/dL (ref 42–145)
Saturation Ratios: 10.9 % — ABNORMAL LOW (ref 20.0–50.0)
Transferrin: 333.9 mg/dL (ref 212.0–360.0)

## 2012-09-04 LAB — VITAMIN B12: Vitamin B-12: 623 pg/mL (ref 211–911)

## 2012-09-04 LAB — PROTIME-INR
INR: 1.1 ratio — ABNORMAL HIGH (ref 0.8–1.0)
Prothrombin Time: 11.6 s (ref 10.2–12.4)

## 2012-09-04 LAB — AFP TUMOR MARKER: AFP-Tumor Marker: 1.4 ng/mL (ref 0.0–8.0)

## 2012-09-04 NOTE — Telephone Encounter (Signed)
Labs in EPIC. Abdominal ultrasound scheduled at [facility] radiology on 09/07/12 at 8:00 AM. NPO after midnight. Patient notified of Dr. Regino Schultze recommendation and given information on appointments date, time and instructions. She will come today for labs.

## 2012-09-07 ENCOUNTER — Ambulatory Visit (HOSPITAL_COMMUNITY)
Admission: RE | Admit: 2012-09-07 | Discharge: 2012-09-07 | Disposition: A | Discharge status: Home / Self Care | Payer: Medicare Other | Source: Ambulatory Visit | Attending: Internal Medicine | Admitting: Internal Medicine

## 2012-09-07 DIAGNOSIS — K766 Portal hypertension: Secondary | ICD-10-CM | POA: Insufficient documentation

## 2012-09-07 DIAGNOSIS — K7460 Unspecified cirrhosis of liver: Secondary | ICD-10-CM | POA: Insufficient documentation

## 2012-09-08 ENCOUNTER — Ambulatory Visit (HOSPITAL_COMMUNITY): Payer: Medicare Other

## 2012-09-08 ENCOUNTER — Telehealth: Payer: Self-pay | Admitting: Internal Medicine

## 2012-09-08 ENCOUNTER — Other Ambulatory Visit: Payer: Self-pay | Admitting: *Deleted

## 2012-09-08 DIAGNOSIS — K7460 Unspecified cirrhosis of liver: Secondary | ICD-10-CM

## 2012-09-08 DIAGNOSIS — D649 Anemia, unspecified: Secondary | ICD-10-CM

## 2012-09-08 NOTE — Telephone Encounter (Signed)
Spoke with Vonna Kotyk and told him Geraldine Contras said they will release the type and cross from my orders for tomorrow. He will call [facility].

## 2012-09-09 ENCOUNTER — Encounter (HOSPITAL_COMMUNITY)
Admission: RE | Admit: 2012-09-09 | Discharge: 2012-09-09 | Disposition: A | Discharge status: Home / Self Care | Payer: Medicare Other | Source: Ambulatory Visit | Attending: Internal Medicine | Admitting: Internal Medicine

## 2012-09-09 MED ORDER — SODIUM CHLORIDE 0.9 % IV SOLN
Freq: Once | INTRAVENOUS | Status: AC
  Administered 2012-09-09: 09:00:00 via INTRAVENOUS

## 2012-09-09 NOTE — Interval H&P Note (Signed)
History and Physical Interval Note:  09/09/2012 11:19 PM  [name]  has presented today for surgery, with the diagnosis of cirrhosis  The various methods of treatment have been discussed with the patient and family. After consideration of risks, benefits and other options for treatment, the patient has consented to  Procedure(s) (LRB) with comments: ESOPHAGOGASTRODUODENOSCOPY (EGD) (N/A) HOT HEMOSTASIS (ARGON PLASMA COAGULATION/BICAP) (N/A) as a surgical intervention .  The patient's history has been reviewed, patient examined, no change in status, stable for surgery.  I have reviewed the patient's chart and labs.  Questions were answered to the patient's satisfaction.     [name]

## 2012-09-09 NOTE — H&P (View-Only) (Signed)
Subjective:    Patient ID: [name], female    DOB: [birth year], 72 y.o.   MRN: [id number]  HPI 72 y/o WF here for a follow up visit... she has mult med problems as noted below>   ~  October 25, 2011:  26mo ROV & [name] is w/o complaints today; SEE 06/12/11 OV for prev note string...  We rechecked labs today (see below), no changes made...    COPD> on Home O2, Advair250, NEBS, Mucinex; remote smoker quit yrs ago; known obstructive & restrictive lung dis; she has lost 10# in the interval & feels as though her breathing is sl improved as a result; asked to continue the same meds for now.     HBP> on Aten50, Lasix40Bid, K20; BP= 120/72 & she has mult somatic complaints but no specific CP, palpit, ch in SOB or edema; continue low sodium 7 wt reduction.     CAD & AS/AI> known non-obstructive CAD; hx atypCP/ CWP; no angina & she remains way too sedentary; discussed risk factor reduction strategy & she has f/u Cards DrCooper soon.    Chol> on Simva40; not really on diet & we discussed this; last FLP 10/12 showed TChol 110, TG 116, HDL 34, LDL 53; continue same, needs exercise!    DM> on Lantus20, Metform1000Bid, Glimep4, Januvia100; needs much better diet & exercise program! Not checking BS at home; BS=276, A1c=6.8 (improved from prev) & Pharm indicates that she is filling pretty regularly...    Obesity> way too sedentary, ?on diet & she has finally lost a little wt (down 10# to 215# today); we discussed diet, exercise, wt reduction strategies...    {GERD, Angiodysplasia w/ chronic GI blood loss...    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 We will continue  to check labs every 39mo & ROV in 26mo...    DJD/ FM/ ?osteopenia/ VitD defic> she has mult somatic complaints & takes Tylenol prn; on VitD 50K weekly...    RLS> on Requip4Qhs & Sinemet25/100Bid from DrDohmeier w/ fair control of symptoms...    Anxiety/ Depression/ Psyche> on RX per Psychiatry DrHejazi (we don't have notes from him) & she does not know her meds, didn't bring med bottles or med list!  As best we can tell she is on Nuvigil, Pristiq, Wellbutrin, Risperdal, Seroquel, Xanax> all from Psychiatry & she is reminded to bring all bottles to every visit... LABS 3/13:  Chems- ok x BS=276 A1c=6.8;  CBC- Hg=11.2 Fe=71 WBC=4.2K, Plat=75K...  ~  Jan 13, 2012:  41mo ROV & post hosp visit> Hosp 3/29 - 11/26/11 w/ COPD exac & mult chronic medical issues; treated w/ Pred & Zithromax & improved;  disch on Oxygen 2L/min, Advair250Bid, Pred taper, Proair prn;  Now improved & approaching baseline, she has completed the Pred taper but noted incr SOB, cough, & yellow sputum==> she wants additional antibiotic rx & we wrote for Avelox x7d...  She has mult additional somatic complaints> dizzy, abd discomfort, RLS, etc; overall they note fewer good days and more bad days; he daugh has been in the hosp & it wears her out to go  for a visit...     Hx DM on Lantus plus Metformin500mg - 2Bid, Glimepiride 4mg  Qam, & Januvia 100mg /d> BS=191, A1c=6.9 and she will maintain her current regimen + diet & try to incr exercise...    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    We reviewed her prob list, meds, xrays and labs> see below>>  LABS 5/13:  FLP- at goals on Simva40 x HDL=32;  Chems- ok x BS=191 A1c=6.9 LFTs=sl elev;  CBC- pancytopenic w/ Hg=9.8 Fe=53;  TSH=3.50  ~  February 24, 2012:  6wk ROV & [name] has experienced slowly incr blood sugars requiring incr Lantus  doses- now at 40u daily & BS checks still in the 300s; in addition she is c/o worsening RLS- prev evaluated by DrDohmeier, Neurology & she was on Requip & Sinemet w/ better control but she stopped the Requip on her own some time ago w/ recurrent RLS symptoms...    Breathing is ok; BP controlled on Aten & Lasix; Chol prev ok on Simva40;  She remains on 6 psychotopic meds from Psychiatry, [name] but she never brings bottles & we don't have notes..    We reviewed prob list, meds, xrays and labs> see below>> LABS 7/13:  Chems- ok x BS=339;  A1c=7.4 REC to slowly incr Lantus by 5u increments until FBS <150...  ~  May 26, 2012:  45mo ROV & [name] relates the following issues since last visit>>    She saw DrDohmeier for Neuro for f/u Tremors & she made several adjustments in her Psyche meds- stopped the Risperdal & decr the Seroquel to 100mg /d; pt notes that tremors are better but still signif (also on SinemetCR-Bid) & she claims "withdrawal" from the Risperdal cessation w/ fallx2/ "messed up my liver"/ "sugars went crazy"...  Initiation of Requip really has helped the RLS...    ?Labs by Neuro showed low iron by pt report (we don't have notes from DrDohmeier) & DrDBrodie ordered Iron infusion for pt- done 04/22/12 w/ 1000mg  Iron Dextran infused at day surg; she is due for f/u labs today> Hg=11.1  Fe=76    She is c/o choking while eating/ swallowing & she has f/u appt w/ DrDBrodie to see about repeat EGD w/ dilatation (last EGD 2011 showed normal esoph, GAVE syndrome w/ argon laser ablation)...    DM control "went crazy" she says> she has cut the Lantus to 25u/d, & still taking the Metform500-2Bid, Glim4/d, Januv100/d; BS at home varies betw 68-242 & labs today showed BS=164  A1c= 5.0     We reviewed prob list, meds, xrays and labs> see below for updates >> OK Flu vaccine today... LABS 10/13:  Chems- ok x BS=164 A1c=5.0;  CBC- ok x Hg=11.1 WBC=4.2 Plat=86 Fe=76 (23%)  ~  September 01, 2012:  45mo ROV & [name]  is c/o a URI "cold in my chest" w/ cough, discolored phlegm "diff colors at diff times", chest congestion, etc; she notes- tired all the time, HH is bothering her, BS at home betw 170-200 range... We reviewed the following medical problems during today's office visit>>      COPD> on Home O2, Advair250, NEBS, Mucinex; remote smoker quit yrs ago; known obstructive & restrictive lung dis; she had ZPak recently but persists w/ c/o discolored phlegm, no f/c/s, vague chest discomfort; last CXR 3/13 showed linear scarring left mid lung, otherw  clear; CTA 3/13 was neg for PE & showed some atelec, coronary calcif, multilevel spondylosis, & cirrhosis of the liver; she declined f/u film today- we decided to treat w/ Levaquin, Mucinex, Fluids, Hycodan...    HBP> on Aten50, Lasix40, K20; BP= 118/82 & she has mult somatic complaints but no specific CP, palpit, ch in SOB or edema; continue low sodium & wt reduction.     CAD & mild AS/AI> known non-obstructive CAD; hx atypCP/ CWP; no angina & she remains way too sedentary; discussed risk factor reduction strategy & she saw DrCooper 7/13> stable, no angina, too sedentary; f/u 2DEcho w/ mild LVH, norm LVF w/ EF=55-60% & norm wall motion, mils AS & AI, Gr2DD...    Chol> on Simva40; not really on diet & we discussed this; last FLP 5/13 showed TChol 94, TG 130, HDL 32, LDL 36; continue same, needs exercise!    DM> on Lantus25, Metform1000Bid, Glimep2, [id number]; needs much better diet & exercise program! Not checking BS at home; labs from 10/13 showed BS=164, A1c=5.0 so we decreased the Glimep from 4mg  to 2mg  each AM...    Obesity> way too sedentary, ?on diet & her wt is up to 209# today; we discussed diet, exercise, wt reduction strategies...    << Chronic GI problems followed by DrDBrodie >>    {GERD, Angiodysplasia w/ chronic GI blood loss >> see below... 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 We will continue to check labs every 52mo w/ Iron infusions of Hg is reduced & ROV in 81mo...    DJD/ FM/ ?osteopenia/ VitD defic> she has mult somatic complaints & takes Tylenol prn; on VitD 50K weekly...    RLS> on Requip4Qhs & Sinemet25/100Bid from DrDohmeier w/ fair control of symptoms- her note is reviewed...    Anxiety/ Depression/ Psyche> on RX per Psychiatry DrHejazi (we don't have notes from him) & she does not know her meds, didn't bring med bottles or med list!  As best we can tell she is on Nuvigil, Pristiq, Wellbutrin, Risperdal, Seroquel, Xanax> all from Psychiatry & she is reminded to bring all bottles to every visit... We reviewed prob list, meds, xrays and labs> see below for updates >>  LABS 1/14:  CBC- Hg=7.0 (down 4pts) Hct=21.9 Fe=51 (11%sat) B12=623, Protime=11.6/ 1.1, AFP=pending; Abd sonar per DrDBrodie- pending...          Problem List:  COPD (ICD-496) & BRONCHITIS, RECURRENT (ICD-491.9) - Ex-smoker w/ underlying COPD on home O2, ADVAIR 250Bid, ALBUTEROL (via nebs or MDI for Prn use), & MUCINEX 1-2Bid... she has combined obstructive AND restrictive disease (based on her obesity) w/ diet (weight reduction) + exercise strongly recommended to the pt...  ~  CTChest 5/06 = neg. ~  baseline CXR w/ bilat LL scarring, NAD.... last CXR 3/09 = unchanged, NAD. ~  PFTs 3/09 showed FVC= 2.47 (83%), FEV1= 1.65 (69%), %1sec= 67, mid-flows= 28%pred. ~  CXR 4/12 showed bilat scarring, NAD... ~  PFT 10/12 showed FVC= 2.28 (77%), FEV1= 1.46 (64%), %1sec= 64, mid-flows= 31% pred. ~  CXR 3/13 showed normal heart size, prom hilar regions, no infiltrates etc, NAD.[name]. ~  CTAngio Chest 3/13 showed neg for PE- scarring at lung bases, no adenopathy or lung lesions,  calcified coronaries, multilevel spondylosis in TSpine, cirrhosis in liver  HYPERTENSION  (ICD-401.9) - on ATENOLOL 50mg /d,  LASIX 40mg /d & K20/d...  ~  10/11:  BP=116/78 and she has mult somatic complaints but all are diminished compared to prev visits; denies bad HA's, CP, palpit, syncope, edema, etc... ~  4/12:  BP= 140/60 and she persists w/ mult somatic complaints... ~  10/12:  BP= 122/74 & she has mult somatic complaints; needs better low sodium, calorie restricted diet & wt reduction... ~  3/13:  BP= 120/72 & she is unchanged symptomatically;  EKG 3/13 showed NSR, rate68, LAD, late transition, NSSTTWA... ~  7/13:  BP= 110/62 & she persists w/ mult somatic complaints... ~  10/13:  BP= 118/60 & she remains clinically stable...  CAD (ICD-414.00) - Hosp at [facility] 4/09 w/ CP... cath showed non-obstructive CAD... no CP, palpit, etc (but she is way too sedentary). ~  cath 4/09 by DrCooper showed normLMAIN, 20% midLAD, norm CIRC, 20-30% proxRCA, EF= 50-55%... ~  EKG 1/11 showed SBrady, rate54, ?septal infarct, NSSTTWA, NAD... ~  EKG 3/13 showed NSR, rate68, LAD, late transition, NSSTTWA... ~  CTAngio Chest 3/13 showed calcif coronaries as noted... ~  DrCooper 7/13> stable, no angina, too sedentary; f/u 2DEcho w/ mild LVH, norm LVF w/ EF=55-60% & norm wall motion, mils AS & AI, Gr2DD.Marland KitchenMarland Kitchen  PALPITATIONS (ICD-785.1) - eval by DrKlein w/ 7 beats WT on monitor... he changed Lisinopril to BBlocker (currently ATENOLOL 50mg /d) and improved... ~  she saw DrCopper for Cards f/u 1/11- palpit well controlled on Atenolol; hx non-obstructive CAD & good LVF; no change in Rx.  AORTIC STENOSIS (ICD-424.1) - she has mild Ao Valve disease w/ AS/ AI... followed by DrCooper. ~  2DEcho 11/06 showed mild ca++ AoV w/ mild AS, norm LVF w/ EF=55-65% ~  repeat 2DEcho 1/09 showed similar mild ca++ AoV w/ mild AS, mild AI, no regional wall motion abn, norm LVf w/ EF= 60%... NOTE: est PAsys= 35... ~  repeat 2DEcho 1/10 showed norm LVF w/ EF= 55-60%, no regional wall motion abn, mild DD, mod calcif AoV & mild  reduced leaflet excursion & mild AI... ~  Repeat 2DEcho 7/13 showed norm LVF w/ EF=55-65%, no regional wall motion abn, Gr2DD, mildAS, mildAI, mild LAdil... ~  DrCooper 7/13> stable, no angina, too sedentary; f/u 2DEcho w/ mild LVH, norm LVF w/ EF=55-60% & norm wall motion, mils AS & AI, Gr2DD...  HYPERCHOLESTEROLEMIA (ICD-272.0) - on SIMVASTATIN 40mg /d... ~  FLP 6/09 showed TChol 107, TG 69, HDL 35, LDL 59 ~  FLP 3/10 showed TChol 128, TG 125, HDL 33, LDL 70 ~  FLP 12/10 showed TChol 98, TG 64, HDL 37, LDL 48... continue same. ~  FLP 10/11 showed TChol 124, TG 105, HDL 37, LDL 66 ~  FLP 4/12 showed TChol 122, TG 95, HDL 37, LDL 66 ~  FLP 10/12 on Simva40 showed TChol 110, TG 116, HDL 34, LDL 53 ~  FLP 5/13 on Simva40 showed TChol 94, TG 130, HDL 32, LDL 36  DM (ICD-250.00) - hx irreg control, not really on diet... on LANTUS 25u daily,  METFORMIN 500mg -2Bid,  GLIMEPIRIDE 4mg /d,  JANUVIA 100mg /d...  ~  labs 10/08 BS=198, HgA1C=7.2 ~  labs 1/09 showed BS=119, HgA1c=6.0 ~  labs 6/09 showed BS= 248, HgA1c= 7.0..[name] discussed need for starting insulin if she can't get on track and get wt down... ~  labs 9/09 showed BS= 236, HgA1c= 7.6.Marland KitchenMarland Kitchen rec- start Lantus 5u/d... grad increased to 10u. ~  labs  11/09 showed BS= 166, HgA1c= 6.0.Marland KitchenMarland Kitchen rec- great! keep same. ~  labs 3/10 showed BS= 169, A1c= 6.8 ~  labs 12/10 showed BS= 149, A1c= 5.8, Umicroalb= neg. ~  labs 10/11 showed BS= 215, A1c= 7.1.[name]. rec> better diet, get wt down. ~  Labs 4/12 showed BS= 172, A1c=5.8 ~  Labs 10/12 showed BS= 245, A1c= 8.1.Marland KitchenMarland Kitchen rec to take meds every day; incr Lantus dose 10==> 20u/d... ~  Labs 3/13 on Lantus20+21meds showed BS= 276, A1c= 6.8 (improved)... ~  4/13:  No diabetic retinopathy per optometrist at [facility]... ~  Labs 5/13 on Lantus20+74meds showed BS= 191, A1c= 6.9 ==> subsequently BS incr & Lantus incr ~  Labs 7/13 on Lantus40+23meds showed BS= 339, A1c= 7.4.Marland KitchenMarland Kitchen ?why control is off, rec move shots around & incr by  5u/wk til BS<200. ~  Labs 10/13 on Lantus25+55meds showed BS= 164, A1c= 5.0.Marland KitchenMarland Kitchen Too tight & rec to decr Lantus20 & decr Glimep4mg  to 1/2 tab...  MORBID OBESITY (ICD-278.01) - not really on diet or exercise program... ~  weight 6/09 = 223# ~  weight 11/09 = 228# ~  weight 3/10 = 225# ~  weight 6/10 = 217# ~  weight 9/10 = 213# ~  weight 12/10 = 224# ~  weight 2/11 = 216# ~  weight 10/11 = 230# ~  Weight 4/12 = 222# ~  Weight 10/12 = 225# ~  Weight 3/13 = 215#...  great job, keep it up... 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  ~  EGD  2/06 showed angiodysplasia & mult gastric pseudopolyps, watermelon stomach improved from 2003. ~  recent f/u EGD 9/09 showed chr gastritis, no watermelon stomach & improved from prev... ~  recurrent anemia 12/10 w/ f/u EGD 2/11 showing gastric antral avm's- s/p argon laser ablation, no varicies etc... 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 ~  5/13:  She is not on PPI and c/o some abd discomfort- rec starting PROTONIX 40mg /d... ~  She saw DrDBrodie 10/13 w/ c/o dysphagia + f/u of her autoimmune liver dis, portal HTN, cirrhosis, "watermelon stomach" & hx argon laser ablation of gastric AVMs (chr GI blood loss w/ periodic Fe infusions- last 8/13); EGD 10/13 showed norm esoph mucosa, no varicies, spasm at the LES but no stricture found; dilated to 19F; mod severe portal hypertensive gastropathy in stomach, no active bleeding; REC> continue PPI, try Levsin...  DIVERTICULOSIS OF COLON (ICD-562.10) & POLYP,  COLON (ICD-211.3) -  ~  last colonoscopy 10/08 w/ adenomatous polyp removed, and angiodysplasia without hemorrhage ~  CT Abd 10/08 showed Cirrhosis, borderline splenomeg, sl pancreatic atrophy & duct dil, sm umbil hernia, DJD sp, NAD... ~  10/13: she had f/u drDBrodie> last colonoscopy was 2008, she felt pt was too high risk for prep & sedation for a f/u colonoscopy & they decided to hold off...  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 ~  LFTs 11/09 = WNL ~  LFTs 3/10 =  WNL ~  LFTs 12/10 = WNL ~  LFTs 10/11 showed SGOT=39 (0-37), SGPT=35 (0-35) ~  LFTs 4/12 showed SGOT=39, SGPT=16 ~  LFTs 10/12 = WNL ~  LFTs 5/13 showed AlkPhos=118, SGOT=46, SGPT=44  Hx of CYSTITIS, RECURRENT (ICD-595.9) - hx mult UTI's w/ eval by DrGrapey for Urology... ~  10/11:  presents w/ dysuria & UTI Rx w/ Cipro... ~  12/11:  She had f/u DrGrapey...  OSTEOARTHRITIS (ICD-715.90)  FIBROMYALGIA (ICD-729.1) - she persists w/ mult somatic complaints...  ? of OSTEOPOROSIS (ICD-733.00) - she had normal BMD in 2001 at [facility]..  RESTLESS LEGS SYNDROME (ICD-333.94) - she prev weaned off the Requip & remains on SINEMET-CR 25-100Bid... ~  9/10: now c/o recurrent restless leg symptoms... restart REQUIP 2mg  & titrate up. ~  12/10: persistant symptoms on 2mg Qhs... rec to incr to 4mg ... ~  2/11: she reports resolution of RLS on 4mg  Requip Qhs, resting well now. ~  12/11:  Recurrent RLS symptoms despite the Requip & seen by DrDohmeier w/ addition of Sinemet 25/100 Bid... ~  2012:  Stable on Requip4mg  & Sinemet 25/100 Bid... ~  7/13:  She is once again c/o incr RLS symptoms & rec to restart REQUIP 4mg /d...  ~  8/13: she had f/u visit w/ DrDohmeier> RLS, tremors, & insomnia; Seroquel helped for awhile, she is working w/ drHejazi to help her problems...  DYSTHYMIA (ICD-300.4) - Hx depression w/ psychotic features and hosp  2/09 at [facility] w/ delusions of pedunculosis> she was prev treated by Dr. Bartholomew Crews & DrSuttenfield for counselling...  Adm to [facility] in May09 by DrBarbSmith w/ depression and meds adjusted (see DC summary)...  Now followed by [name] and [name] at [facility]- she was re-admitted 10/09 and meds changed to RISPERDAL, PRISTIQUE, BUPROPION, SEROQUEL, XANAX & Nuvigil (Armodafinil)...  husb indicates that she is much better at present & continues outpt Rx... ~  MRI/ MRA Brain 2/08 showed sm vessel dis & atherosclerotic changes in post circ... ~  9/10: pt indicates that she is off the Trazodone... DrHejazi added Wellbutrin ?for her RLS? ~  2011:  pt continues regular f/u w/ Psychiatry on numerous meds w/ freq med adjustments; she is requested to get notes from [facility] to our attention & bring all med bottles to her OVs for review... ~  2012:  We have not received any notes from Psyche; pt has not complied w/ our request for med bottles to be brought to each OV... ~  2013:  We still don't have accurate list of her current meds as we have not received Psyche notes & she NEVER brings meds to office despite our requests...  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 ~  labs 04/18/08 by DrBrodie showed Hg= 10.4, Fe= 52... she ordered 1,200mg  IV Iron- given 04/26/08. ~  labs 05/19/08 showed Hg= 11.3, Fe= 117... ~  labs 11/09 showed Hg= 12.0.Marland KitchenMarland Kitchen ~  labs 3/10 showed Hg= 12.3, Fe= 79 ~  labs 6/10 showed Hg= 11.5, Fe= 73 ~  labs 12/10 showed Hg= 7.2, Fe= 28 (6%sat)... given [facility] & IV Fe per DrBrodie in Jan2012. ~  labs 2/11 showed Hg= 11.8, Fe= 95... pt indicates that DrBrodie will be following labs going forward... ~  labs 6/11 showed Hg= 11.2 ~  labs 9/11  showed Hg= 11.1, Fe=  106 ~  Labs 4/12 showed Hg=11.0, Fe=118 ~  Labs 10/12 showed Hg=9.5, Fe=35 (7%sat)... We will arrange for outpt infusion 1000mg  Fe... ~  Labs 12/12 showed Hg=11.4, Fe=92 (26%sat) ~  Labs 3/13 showed Hg=11.2, Fe=71 (17%sat) ~  Labs 5/13 showed Hg=9.8, Fe=53 (13%sat) ~  04/22/12:  She was given 1000mg  IV Iron Dextran by DrDBrodie... ~  Labs 10/13 showed Hg=11.1, Fe=76 (23%) ~  Labs 1/14 showed Hg=7.0 (down 4pts) Hct=21.9 Fe=51 (11%sat) B12=623, Protime=11.6/ 1.1, AFP=pending; Abd sonar per DrDBrodie- pending...     Past Surgical History  Procedure Date  . Hemorroidectomy   . Appendectomy   . Vaginal hysterectomy   . Breast biopsy     bilateral  . Cesarean section     x 3  . Appendectomy   . Esophagogastroduodenoscopy 06/12/2012    Procedure: ESOPHAGOGASTRODUODENOSCOPY (EGD);  Surgeon: Hart Carwin, MD;  Location: [facility];  Service: Endoscopy;  Laterality: N/A;  . Balloon dilation 06/12/2012    Procedure: BALLOON DILATION;  Surgeon: Hart Carwin, MD;  Location: [facility];  Service: Endoscopy;  Laterality: N/A;  ?balloon    Outpatient Encounter Prescriptions as of 09/01/2012  Medication Sig Dispense Refill  . albuterol (PROAIR HFA) 108 (90 BASE) MCG/ACT inhaler Inhale 2 puffs into the lungs every 6 (six) hours as needed.        . ALPRAZolam (XANAX) 1 MG tablet Take 1 mg by mouth at bedtime.       . Armodafinil (NUVIGIL) 150 MG tablet Take 150 mg by mouth daily.      [name] atenolol (TENORMIN) 50 MG tablet TAKE 1 TABLET BY MOUTH EVERY DAY  30 tablet  10  . b complex vitamins tablet Take 1 tablet by mouth daily.       . B-D ULTRAFINE III SHORT PEN 31G X 8 MM MISC USE AS DIRECTED  100 each  6  . buPROPion (WELLBUTRIN XL) 150 MG 24 hr tablet Take 150 mg by mouth daily.      . carbidopa-levodopa (SINEMET CR) 25-100 MG per tablet Take 1 tablet by mouth 2 (two) times daily. 1 at lunch and 1 at dinner      . desvenlafaxine (PRISTIQ) 100 MG 24 hr tablet Take 100 mg by  mouth daily.      [name] dextromethorphan-guaiFENesin (MUCINEX DM) 30-600 MG per 12 hr tablet Take 1-2 tablets by mouth every 12 (twelve) hours as needed.      . Fluticasone-Salmeterol (ADVAIR DISKUS) 250-50 MCG/DOSE AEPB Inhale 1 puff into the lungs 2 (two) times daily.  14 each  0  . furosemide (LASIX) 40 MG tablet Take 40 mg by mouth daily.      [name] glimepiride (AMARYL) 4 MG tablet Take 1/2 tablet by mouth every morning  30 tablet  6  . hyoscyamine (LEVSIN/SL) 0.125 MG SL tablet Place 1 tablet (0.125 mg total) under the tongue 3 (three) times daily before meals.  90 tablet  3  . insulin glargine (LANTUS) 100 UNIT/ML injection Inject 25 Units into the skin daily.      [name] JANUVIA 100 MG tablet TAKE 1 TABLET EVERY DAY AS DIRECTED  30 tablet  4  . metFORMIN (GLUCOPHAGE) 500 MG tablet TAKE 2 TABLETS BY MOUTH TWICE A DAY  120 tablet  4  . Multiple Vitamins-Minerals (CENTRUM SILVER PO) Take 1 tablet by mouth daily.       . pantoprazole (PROTONIX) 40 MG tablet Take 1 tablet (40 mg total) by mouth daily.  90 tablet  3  . polyethylene glycol powder (GLYCOLAX/MIRALAX) powder Take 17 g by mouth daily.  527 g  2  . potassium chloride SA (K-DUR,KLOR-CON) 20 MEQ tablet Take 20 mEq by mouth daily.      . promethazine (PHENERGAN) 25 MG tablet Take 25 mg by mouth every 6 (six) hours as needed.        [name] QUEtiapine (SEROQUEL) 100 MG tablet Take 100 mg by mouth at bedtime.       [name] rOPINIRole (REQUIP) 4 MG tablet Take 1 tablet (4 mg total) by mouth at bedtime.  30 tablet  6  . simvastatin (ZOCOR) 40 MG tablet TAKE 1 TABLET BY MOUTH AT BEDTIME FOR CHOLESTEROL  30 tablet  6  . sitaGLIPtin (JANUVIA) 100 MG tablet Take 1 tablet (100 mg total) by mouth every evening.  14 tablet  0  . Vitamin D, Ergocalciferol, (DRISDOL) 50000 UNITS CAPS Take 1 capsule (50,000 Units total) by mouth every 7 (seven) days. Take on friday  5 capsule  11  . [DISCONTINUED] insulin glargine (LANTUS) 100 UNIT/ML injection Inject 20 Units into the skin  daily.  10 mL  6  . [DISCONTINUED] azithromycin (ZITHROMAX) 250 MG tablet Take as directed  6 each  2  . [DISCONTINUED] hyoscyamine (LEVSIN/SL) 0.125 MG SL tablet Place 1 tablet (0.125 mg total) under the tongue 3 (three) times daily before meals.  90 tablet  3  . [DISCONTINUED] KLOR-CON M20 20 MEQ tablet TAKE 1 TABLET TWICE DAILY  60 tablet  11    Allergies  Allergen Reactions  . Aspirin Other (See Comments)    Causes nosebleeds  . Penicillins Itching  . Theophylline Nausea And Vomiting    Current Medications, Allergies, Past Medical History, Past Surgical History, Family History, and Social History were reviewed in [employer] record.    Review of Systems         See HPI - all other systems neg except as noted...      The patient complains of weight gain, dyspnea on exertion, muscle weakness, difficulty walking, and depression.  The patient denies anorexia, fever, weight loss, vision loss, decreased hearing, hoarseness, chest pain, syncope, peripheral edema, prolonged cough, headaches, hemoptysis, abdominal pain, melena, hematochezia, severe indigestion/heartburn, hematuria, incontinence, suspicious skin lesions, transient blindness, unusual weight change, abnormal bleeding, enlarged lymph nodes, and angioedema.     Objective:   Physical Exam     WD, Obese, 72 y/o WF in NAD... she is chr ill appearing... GENERAL:  Alert & oriented x 3... HEENT:  [place]/AT, pale conjuct, EOM-wnl, PERRLA, EACs-clear, TMs-wnl, NOSE-clear, THROAT-clear w/ dry MMs... NECK:  Supple w/ fairROM; no JVD; normal carotid impulses w/o bruits; no thyromegaly or nodules palpated; no lymphadenopathy. CHEST:  Clear to P & A; without wheezes/ rales/ or rhonchi heard... HEART:  Regular Rhythm;  gr 1/6 SEM without rubs or gallops... ABDOMEN:  Obese, soft & nontender w/ panniculus; normal bowel sounds; no organomegaly or masses detected. EXT: without deformities, mod arthritic changes; no varicose  veins/ +venous insuffic/ tr edema. NEURO:  CN's intact;  no focal neuro deficits... DERM:  No lesions noted; no rash, pale complexion.  RADIOLOGY DATA:  Reviewed in the EPIC EMR & discussed w/ the patient...  LABORATORY DATA:  Reviewed in the EPIC EMR & discussed w/ the patient...   Assessment & Plan:    COPD>  Combined obstructive & restrictive lung dis; PFT showed sl deterioration in FEV1; multifactorial dyspnea including stress; REC> continue Advair, NEBS, O2, Mucinex,  etc; get on diet/ exercise/ get weight down; continue Psyche f/u for help w/ anxiety...  HBP>  Controlled on low dose BBlocker, diuretic> continue same & work on weight reduction...  CAD>  Known nonobstructive dis, way too sedentary> we discussed diet + exercise...  AS>  Stable w/o angina, syncope, etc; she continues to f/u w/ DrCooper for Cards...  CHOL>  Stable on the Simva40...  DM>  Control is volatile & she is asked to move shots around etc;  Continue home monitoring & regular f/u here...  OBESITY>  Reviewed diet + exercise needed...  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  Autoimmune hepatitis>  Also managed by DrBrodie, off the Immuran at present... She also has pancytopenia related to her cirrhosis.  RLS>  Notes from DrDohmeier reviewed> on Sinemet + needs to restart Requip...  Dysthymia>  followed by Psyche on mult meds...  Anemia>  Last received 1000mg  IV Iron [id number] & we continue to monitor Hg & Fe- we will continue labs Q8wks...   Patient's Medications  New Prescriptions   HYDROCODONE-HOMATROPINE (HYCODAN) 5-1.5 MG/5ML SYRUP    Take 5 mLs by mouth every 6 (six) hours as needed for cough.   LEVOFLOXACIN (LEVAQUIN) 500 MG TABLET    Take 1 tablet (500 mg total) by mouth daily.  Previous Medications   ALBUTEROL (PROAIR HFA) 108 (90 BASE) MCG/ACT INHALER    Inhale 2 puffs into the lungs every 6 (six) hours as needed.      ALPRAZOLAM (XANAX) 1 MG TABLET    Take 1 mg by mouth at bedtime.    ARMODAFINIL (NUVIGIL) 150 MG TABLET    Take 150 mg by mouth daily.   ATENOLOL (TENORMIN) 50 MG TABLET    TAKE 1 TABLET BY MOUTH EVERY DAY   B COMPLEX VITAMINS TABLET    Take 1 tablet by mouth daily.    B-D ULTRAFINE III SHORT PEN 31G X 8 MM MISC    USE AS DIRECTED   BUPROPION (WELLBUTRIN XL) 150 MG 24 HR TABLET    Take 150 mg by mouth daily.   CARBIDOPA-LEVODOPA (SINEMET CR) 25-100 MG PER TABLET    Take 1 tablet by mouth 2 (two) times daily. 1 at lunch and 1 at dinner   DESVENLAFAXINE (PRISTIQ) 100 MG 24 HR TABLET    Take 100 mg by mouth daily.   DEXTROMETHORPHAN-GUAIFENESIN (MUCINEX DM) 30-600 MG PER 12 HR TABLET    Take 1-2 tablets by mouth every 12 (twelve) hours as needed.   FLUTICASONE-SALMETEROL (ADVAIR DISKUS) 250-50 MCG/DOSE AEPB    Inhale 1 puff into the lungs 2 (two) times daily.   FUROSEMIDE (LASIX) 40 MG TABLET    Take 40 mg by mouth daily.   GLIMEPIRIDE (AMARYL) 4 MG TABLET    Take 1/2 tablet by mouth every morning   HYOSCYAMINE (LEVSIN/SL) 0.125 MG SL TABLET    Place 1 tablet (0.125 mg total) under the tongue 3 (three) times daily before meals.   JANUVIA 100 MG TABLET    TAKE 1 TABLET EVERY DAY AS DIRECTED   METFORMIN (GLUCOPHAGE) 500 MG TABLET    TAKE 2 TABLETS BY MOUTH TWICE A DAY   MULTIPLE VITAMINS-MINERALS (CENTRUM SILVER PO)    Take 1 tablet by mouth daily.    PANTOPRAZOLE (PROTONIX) 40 MG TABLET    Take 1 tablet (40 mg total) by mouth daily.   POLYETHYLENE GLYCOL POWDER (GLYCOLAX/MIRALAX) POWDER    Take 17  g by mouth daily.   POTASSIUM CHLORIDE SA (K-DUR,KLOR-CON) 20 MEQ TABLET    Take 20 mEq by mouth daily.   PROMETHAZINE (PHENERGAN) 25 MG TABLET    Take 25 mg by mouth every 6 (six) hours as needed.     QUETIAPINE (SEROQUEL) 100 MG TABLET    Take 100 mg by mouth at bedtime.    ROPINIROLE (REQUIP) 4 MG TABLET    Take 1 tablet (4 mg total) by mouth at bedtime.   SIMVASTATIN (ZOCOR) 40 MG TABLET    TAKE 1  TABLET BY MOUTH AT BEDTIME FOR CHOLESTEROL   SITAGLIPTIN (JANUVIA) 100 MG TABLET    Take 1 tablet (100 mg total) by mouth every evening.   VITAMIN D, ERGOCALCIFEROL, (DRISDOL) 50000 UNITS CAPS    Take 1 capsule (50,000 Units total) by mouth every 7 (seven) days. Take on friday  Modified Medications   Modified Medication Previous Medication   INSULIN GLARGINE (LANTUS) 100 UNIT/ML INJECTION insulin glargine (LANTUS) 100 UNIT/ML injection      Inject 25 Units into the skin daily.    Inject 20 Units into the skin daily.  Discontinued Medications   AZITHROMYCIN (ZITHROMAX) 250 MG TABLET    Take as directed   HYOSCYAMINE (LEVSIN/SL) 0.125 MG SL TABLET    Place 1 tablet (0.125 mg total) under the tongue 3 (three) times daily before meals.   KLOR-CON M20 20 MEQ TABLET    TAKE 1 TABLET TWICE DAILY

## 2012-09-10 ENCOUNTER — Encounter (HOSPITAL_COMMUNITY)
Admission: RE | Disposition: A | Discharge status: Home / Self Care | Payer: Self-pay | Source: Ambulatory Visit | Attending: Internal Medicine

## 2012-09-10 ENCOUNTER — Telehealth: Payer: Self-pay | Admitting: *Deleted

## 2012-09-10 ENCOUNTER — Ambulatory Visit (HOSPITAL_COMMUNITY)
Admission: RE | Admit: 2012-09-10 | Discharge: 2012-09-10 | Disposition: A | Discharge status: Home / Self Care | Payer: Medicare Other | Source: Ambulatory Visit | Attending: Internal Medicine | Admitting: Internal Medicine

## 2012-09-10 ENCOUNTER — Encounter (HOSPITAL_COMMUNITY): Payer: Self-pay | Admitting: *Deleted

## 2012-09-10 ENCOUNTER — Other Ambulatory Visit: Payer: Self-pay | Admitting: *Deleted

## 2012-09-10 DIAGNOSIS — K921 Melena: Secondary | ICD-10-CM | POA: Insufficient documentation

## 2012-09-10 DIAGNOSIS — K5520 Angiodysplasia of colon without hemorrhage: Secondary | ICD-10-CM

## 2012-09-10 DIAGNOSIS — K754 Autoimmune hepatitis: Secondary | ICD-10-CM

## 2012-09-10 DIAGNOSIS — D509 Iron deficiency anemia, unspecified: Secondary | ICD-10-CM

## 2012-09-10 DIAGNOSIS — R1310 Dysphagia, unspecified: Secondary | ICD-10-CM

## 2012-09-10 DIAGNOSIS — K766 Portal hypertension: Secondary | ICD-10-CM | POA: Insufficient documentation

## 2012-09-10 DIAGNOSIS — K319 Disease of stomach and duodenum, unspecified: Secondary | ICD-10-CM | POA: Insufficient documentation

## 2012-09-10 DIAGNOSIS — K7460 Unspecified cirrhosis of liver: Secondary | ICD-10-CM | POA: Insufficient documentation

## 2012-09-10 DIAGNOSIS — K31819 Angiodysplasia of stomach and duodenum without bleeding: Secondary | ICD-10-CM | POA: Insufficient documentation

## 2012-09-10 DIAGNOSIS — D500 Iron deficiency anemia secondary to blood loss (chronic): Secondary | ICD-10-CM | POA: Insufficient documentation

## 2012-09-10 HISTORY — PX: HOT HEMOSTASIS: SHX5433

## 2012-09-10 HISTORY — PX: ESOPHAGOGASTRODUODENOSCOPY: SHX5428

## 2012-09-10 LAB — TYPE AND SCREEN
ABO/RH(D): O POS
Antibody Screen: NEGATIVE
Unit division: 0
Unit division: 0

## 2012-09-10 SURGERY — EGD (ESOPHAGOGASTRODUODENOSCOPY)
Anesthesia: Moderate Sedation

## 2012-09-10 MED ORDER — MIDAZOLAM HCL 10 MG/2ML IJ SOLN
INTRAMUSCULAR | Status: AC
  Filled 2012-09-10: qty 4

## 2012-09-10 MED ORDER — MIDAZOLAM HCL 10 MG/2ML IJ SOLN
INTRAMUSCULAR | Status: DC | PRN
  Administered 2012-09-10 (×4): 2 mg via INTRAVENOUS

## 2012-09-10 MED ORDER — FENTANYL CITRATE 0.05 MG/ML IJ SOLN
INTRAMUSCULAR | Status: AC
  Filled 2012-09-10: qty 4

## 2012-09-10 MED ORDER — BUTAMBEN-TETRACAINE-BENZOCAINE 2-2-14 % EX AERO
INHALATION_SPRAY | CUTANEOUS | Status: DC | PRN
  Administered 2012-09-10: 2 via TOPICAL

## 2012-09-10 MED ORDER — FENTANYL CITRATE 0.05 MG/ML IJ SOLN
INTRAMUSCULAR | Status: DC | PRN
  Administered 2012-09-10 (×4): 25 ug via INTRAVENOUS

## 2012-09-10 MED ORDER — DIPHENHYDRAMINE HCL 50 MG/ML IJ SOLN
INTRAMUSCULAR | Status: AC
  Filled 2012-09-10: qty 1

## 2012-09-10 NOTE — Interval H&P Note (Signed)
History and Physical Interval Note:  09/10/2012 10:52 AM  [name]  has presented today for surgery, with the diagnosis of cirrhosis  The various methods of treatment have been discussed with the patient and family. After consideration of risks, benefits and other options for treatment, the patient has consented to  Procedure(s) (LRB) with comments: ESOPHAGOGASTRODUODENOSCOPY (EGD) (N/A) HOT HEMOSTASIS (ARGON PLASMA COAGULATION/BICAP) (N/A) as a surgical intervention .  The patient's history has been reviewed, patient examined, no change in status, stable for surgery.  I have reviewed the patient's chart and labs.  Questions were answered to the patient's satisfaction.     [name]

## 2012-09-10 NOTE — Telephone Encounter (Signed)
Per Dr. Juanda Chance, CBC on day of Feraheme infusion. Spoke with Tammy at short stay and order in EPIC.

## 2012-09-10 NOTE — Op Note (Signed)
[facility] [address] [place], [address]   ENDOSCOPY PROCEDURE REPORT  PATIENT: [name], [name]  MR#: [id number] BIRTHDATE: [birth year] , 71  yrs. old GENDER: Female ENDOSCOPIST: Hart Carwin, MD REFERRED BY:  Alroy Dust, M.D. PROCEDURE DATE:  09/10/2012 PROCEDURE:  EGD, diagnostic and EGD w/ ablation ASA CLASS:     Class III INDICATIONS:  Anemia secondary to chronic blood loss. Hematochezia.   Occult blood positive.   portal hypertensive gastropathy, sudden drop in Hgb=7.0, last EGD 05/2012- no ablation, last Argon laser coag in 2010, cirrhosis,autoimmune. MEDICATIONS: These medications were titrated to patient response per physician's verbal order, Fentanyl-Detailed 100 mg IV, and Versed 10 mg IV TOPICAL ANESTHETIC: Cetacaine Spray  DESCRIPTION OF PROCEDURE: After the risks benefits and alternatives of the procedure were thoroughly explained, informed consent was obtained.  The [id number] endoscope was introduced through the mouth and advanced to the second portion of the duodenum. Without limitations.  The instrument was slowly withdrawn as the mucosa was fully examined.      [place] stomach-antrum,,multiple applications with a stright forward probe, A40, 150 W, no bleeding, edematous mucosa of the gastric body, ,no gastric varices, small esophageal varices. Retroflexed views revealed no abnormalities.     The scope was then withdrawn from the patient and the procedure completed.  COMPLICATIONS: There were no complications. ENDOSCOPIC IMPRESSION: [place] stomach-antrum,,multiple applications with a stright forward Argon laser  probe, A40, 150 Wsetting, no bleeding, edematous mucosa of the gastric body, ,no gastric varisec, small esophageal varices  RECOMMENDATIONS:  Resume PPI Light food today Iron infusion has been scheduled for 09/18/2012, recheck CBC  in 1 week   REPEAT EXAM:  no recall  eSigned:  Hart Carwin, MD 09/10/2012  12:59 PM   CC:  PATIENT NAME:  [name], [name] MR#: [id number]

## 2012-09-11 ENCOUNTER — Encounter (HOSPITAL_COMMUNITY): Payer: Self-pay | Admitting: Internal Medicine

## 2012-09-16 ENCOUNTER — Other Ambulatory Visit (HOSPITAL_COMMUNITY): Payer: Self-pay | Admitting: Internal Medicine

## 2012-09-18 ENCOUNTER — Encounter (HOSPITAL_COMMUNITY)
Admission: RE | Admit: 2012-09-18 | Discharge: 2012-09-18 | Disposition: A | Discharge status: Home / Self Care | Payer: Medicare Other | Source: Ambulatory Visit | Attending: Internal Medicine | Admitting: Internal Medicine

## 2012-09-18 ENCOUNTER — Other Ambulatory Visit: Payer: Self-pay | Admitting: *Deleted

## 2012-09-18 ENCOUNTER — Encounter (HOSPITAL_COMMUNITY): Payer: Self-pay

## 2012-09-18 VITALS — BP 139/64 | HR 57 | Temp 98.2°F | Resp 20

## 2012-09-18 DIAGNOSIS — K7460 Unspecified cirrhosis of liver: Secondary | ICD-10-CM

## 2012-09-18 DIAGNOSIS — D649 Anemia, unspecified: Secondary | ICD-10-CM

## 2012-09-18 LAB — CBC WITH DIFFERENTIAL/PLATELET
Eosinophils Absolute: 0.2 10*3/uL (ref 0.0–0.7)
Eosinophils Relative: 5 % (ref 0–5)
Hemoglobin: 8.5 g/dL — ABNORMAL LOW (ref 12.0–15.0)
Lymphs Abs: 0.5 10*3/uL — ABNORMAL LOW (ref 0.7–4.0)
MCH: 27 pg (ref 26.0–34.0)
MCV: 92.7 fL (ref 78.0–100.0)
Monocytes Relative: 11 % (ref 3–12)
RBC: 3.15 MIL/uL — ABNORMAL LOW (ref 3.87–5.11)

## 2012-09-18 MED ORDER — SODIUM CHLORIDE 0.9 % IV SOLN
Freq: Once | INTRAVENOUS | Status: AC
  Administered 2012-09-18: 09:00:00 via INTRAVENOUS

## 2012-09-18 MED ORDER — SODIUM CHLORIDE 0.9 % IV SOLN
1020.0000 mg | Freq: Once | INTRAVENOUS | Status: AC
  Administered 2012-09-18: 1020 mg via INTRAVENOUS
  Filled 2012-09-18: qty 34

## 2012-09-21 ENCOUNTER — Telehealth: Payer: Self-pay | Admitting: Internal Medicine

## 2012-09-21 NOTE — Telephone Encounter (Signed)
Doesn't sound like anything new for this patient. I would recommend checking with Dr. Juanda Chance in the morning, to see if there is any particular that she would like to do differently. I can't think of anything right now. Possibly a repeat CBC this week.

## 2012-09-21 NOTE — Telephone Encounter (Signed)
Patient with hx of cirrhosis, pancytopenia, watermelon stomach and COPD. 09/09/12- transfusion of 2 u PRBC's, 09/10/12- EGD with ablation, 09/18/12- IV iron. Last CBC 09/18/12 hgb-8.5. Patient calling because she does not feel any better after transfusion and IV iron. States she is still SOB(she is on continuous O2 2 L/min at rest and 3 L/min if ambulating). States she is more SOB today than usual. Also reports her stomach is swollen. She also c/o weakness but states this is about her usual. She reports she is seeing pink color blood in stool but states Dr. Juanda Chance is aware of this and she has had it a couple of months. Dr. Juanda Chance, off, DOD- Dr. Marina Goodell. Please, advise.

## 2012-09-22 NOTE — Telephone Encounter (Signed)
Spoke with Dr. Juanda Chance concerning patient's symptoms. Per Dr. Juanda Chance, patient should call her PCP due to multiple health issues.Patient aware.

## 2012-09-23 ENCOUNTER — Other Ambulatory Visit: Payer: Self-pay | Admitting: Pulmonary Disease

## 2012-09-30 ENCOUNTER — Telehealth: Payer: Self-pay | Admitting: Pulmonary Disease

## 2012-09-30 MED ORDER — SITAGLIPTIN PHOSPHATE 100 MG PO TABS: 100.0000 mg | ORAL_TABLET | Freq: Every evening | ORAL | Status: DC

## 2012-09-30 MED ORDER — FLUTICASONE-SALMETEROL 250-50 MCG/DOSE IN AEPB: 1.0000 | INHALATION_SPRAY | Freq: Two times a day (BID) | RESPIRATORY_TRACT | Status: DC

## 2012-09-30 NOTE — Telephone Encounter (Signed)
1 sample box of each(Januvia and Advair 250/50) at front for pick up-pt's husband(caller) aware. Samples documented in chart.

## 2012-10-01 ENCOUNTER — Telehealth: Payer: Self-pay | Admitting: *Deleted

## 2012-10-01 NOTE — Telephone Encounter (Signed)
Spoke with patient and reminded her to come for labs next week.

## 2012-10-01 NOTE — Telephone Encounter (Signed)
Message copied by Daphine Deutscher on Thu Oct 01, 2012  8:23 AM ------      Message from: Daphine Deutscher      Created: Fri Sep 18, 2012  1:02 PM       Call and remind patient she is due for CBC 10/05/12 DB

## 2012-10-04 ENCOUNTER — Other Ambulatory Visit: Payer: Self-pay | Admitting: Pulmonary Disease

## 2012-10-06 ENCOUNTER — Other Ambulatory Visit (INDEPENDENT_AMBULATORY_CARE_PROVIDER_SITE_OTHER): Payer: Medicare Other

## 2012-10-06 ENCOUNTER — Other Ambulatory Visit: Payer: Self-pay | Admitting: *Deleted

## 2012-10-06 DIAGNOSIS — D649 Anemia, unspecified: Secondary | ICD-10-CM

## 2012-10-06 DIAGNOSIS — D509 Iron deficiency anemia, unspecified: Secondary | ICD-10-CM

## 2012-10-06 LAB — CBC WITH DIFFERENTIAL/PLATELET
Eosinophils Relative: 4 % (ref 0.0–5.0)
HCT: 27 % — ABNORMAL LOW (ref 36.0–46.0)
Hemoglobin: 8.8 g/dL — ABNORMAL LOW (ref 12.0–15.0)
Lymphs Abs: 0.5 10*3/uL — ABNORMAL LOW (ref 0.7–4.0)
MCV: 96.4 fl (ref 78.0–100.0)
Monocytes Absolute: 0.3 10*3/uL (ref 0.1–1.0)
Neutro Abs: 2.3 10*3/uL (ref 1.4–7.7)
Platelets: 84 10*3/uL — ABNORMAL LOW (ref 150.0–400.0)
RDW: 23.9 % — ABNORMAL HIGH (ref 11.5–14.6)
WBC: 3.2 10*3/uL — ABNORMAL LOW (ref 4.5–10.5)

## 2012-10-15 ENCOUNTER — Telehealth: Payer: Self-pay | Admitting: Internal Medicine

## 2012-10-15 ENCOUNTER — Other Ambulatory Visit (INDEPENDENT_AMBULATORY_CARE_PROVIDER_SITE_OTHER): Payer: Medicare Other

## 2012-10-15 ENCOUNTER — Ambulatory Visit (INDEPENDENT_AMBULATORY_CARE_PROVIDER_SITE_OTHER)
Admission: RE | Admit: 2012-10-15 | Discharge: 2012-10-15 | Disposition: A | Discharge status: Home / Self Care | Payer: Medicare Other | Source: Ambulatory Visit | Attending: Internal Medicine | Admitting: Internal Medicine

## 2012-10-15 ENCOUNTER — Ambulatory Visit (INDEPENDENT_AMBULATORY_CARE_PROVIDER_SITE_OTHER): Payer: Medicare Other | Admitting: Gastroenterology

## 2012-10-15 ENCOUNTER — Encounter: Payer: Self-pay | Admitting: Gastroenterology

## 2012-10-15 VITALS — BP 130/80 | HR 62 | Ht 64.0 in | Wt 210.0 lb

## 2012-10-15 DIAGNOSIS — R531 Weakness: Secondary | ICD-10-CM

## 2012-10-15 DIAGNOSIS — R140 Abdominal distension (gaseous): Secondary | ICD-10-CM

## 2012-10-15 DIAGNOSIS — R5383 Other fatigue: Secondary | ICD-10-CM

## 2012-10-15 DIAGNOSIS — R0602 Shortness of breath: Secondary | ICD-10-CM

## 2012-10-15 DIAGNOSIS — R5381 Other malaise: Secondary | ICD-10-CM

## 2012-10-15 DIAGNOSIS — D500 Iron deficiency anemia secondary to blood loss (chronic): Secondary | ICD-10-CM

## 2012-10-15 DIAGNOSIS — R143 Flatulence: Secondary | ICD-10-CM

## 2012-10-15 DIAGNOSIS — K7460 Unspecified cirrhosis of liver: Secondary | ICD-10-CM

## 2012-10-15 DIAGNOSIS — D509 Iron deficiency anemia, unspecified: Secondary | ICD-10-CM

## 2012-10-15 DIAGNOSIS — K31819 Angiodysplasia of stomach and duodenum without bleeding: Secondary | ICD-10-CM

## 2012-10-15 LAB — CBC WITH DIFFERENTIAL/PLATELET
Basophils Absolute: 0 10*3/uL (ref 0.0–0.1)
HCT: 28.6 % — ABNORMAL LOW (ref 36.0–46.0)
Lymphs Abs: 0.3 10*3/uL — ABNORMAL LOW (ref 0.7–4.0)
MCV: 98.7 fl (ref 78.0–100.0)
Monocytes Absolute: 0.4 10*3/uL (ref 0.1–1.0)
Platelets: 93 10*3/uL — ABNORMAL LOW (ref 150.0–400.0)
RDW: 21.6 % — ABNORMAL HIGH (ref 11.5–14.6)

## 2012-10-15 LAB — COMPREHENSIVE METABOLIC PANEL
Alkaline Phosphatase: 100 U/L (ref 39–117)
Glucose, Bld: 131 mg/dL — ABNORMAL HIGH (ref 70–99)
Sodium: 141 mEq/L (ref 135–145)
Total Bilirubin: 1.6 mg/dL — ABNORMAL HIGH (ref 0.3–1.2)
Total Protein: 6.3 g/dL (ref 6.0–8.3)

## 2012-10-15 LAB — BRAIN NATRIURETIC PEPTIDE: Pro B Natriuretic peptide (BNP): 164 pg/mL — ABNORMAL HIGH (ref 0.0–100.0)

## 2012-10-15 LAB — IBC PANEL
Iron: 177 ug/dL — ABNORMAL HIGH (ref 42–145)
Saturation Ratios: 44.7 % (ref 20.0–50.0)

## 2012-10-15 NOTE — Telephone Encounter (Signed)
Patient notified and scheduled with Doug Sou, PA today at 2:15 PM. Labs and CXR in EPIC.

## 2012-10-15 NOTE — Progress Notes (Signed)
Reviewed, thanks for seeing her. False alarm? BNP pending

## 2012-10-15 NOTE — Patient Instructions (Addendum)
Please follow up with your Pulmonologist and your Cardiologist. Please have labs done in 1 week at our basement lab Monday thru Friday 730 am - 5 pm. Please monitor your weight daily. Please try gaviscon over the counter CC:  Alroy Dust MD

## 2012-10-15 NOTE — Telephone Encounter (Signed)
Please obtain CBC,BNP,c-met, CXR and schedule with a health extender.

## 2012-10-15 NOTE — Telephone Encounter (Signed)
Patient calling to report [name] is feeling like [name] did when Dr. Juanda Chance gave her blood last time. Cannot breathe. States "my stomach blows up when I walk."  Using O2 all the time. Please, advise.

## 2012-10-15 NOTE — Progress Notes (Signed)
10/15/2012 [name] [id number] June 10, 1941   History of Present Illness: This is a 72 year old white female with autoimmune liver disease, cirrhosis and portal hypertension. 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 She had an iron infusion on January 14 when her hemoglobin dropped to 7.0 grams. She has a history of argon laser ablation of gastric AVMs, last on 09/10/12.  She had small esophageal varices at that time as well.  She is here today complaining of SOB, fatigue, and swelling of her stomach.  Ultrasound in January showed NO ascites.  She is on Protonix 40 mg daily.  Her Hgb today is 9.3, which is continuing to trend up from one month ago.  Chest xray shows no acute disease.  LFT's and renal function/electrolytes are stable.  BNP and anemia panel are pending.  Her last colonoscopy October 2008 showed a tubular adenoma.  She has CHF and has COPD/asthma and follows with cardiology and pulmonary.     Current Medications, Allergies, Past Medical History, Past Surgical History, Family History and Social History were reviewed in [employer] record.   Physical Exam: BP 130/80  Pulse 62  Ht 5\' 4"  (1.626 m)  Wt 210 lb (95.255 kg)  BMI 36.03 kg/m2 General: Well developed, white female in no acute distress; appears older than stated age. Head: Normocephalic and atraumatic Eyes:  sclerae anicteric, conjunctiva pink  Ears: Normal auditory acuity Lungs: Clear throughout to auscultation Heart: Regular rate and rhythm Abdomen: Soft, obese, non-tender and non-distended. Normal bowel sounds. Musculoskeletal: Symmetrical with no gross deformities  Extremities: No edema  Neurological: Alert oriented x 4, grossly nonfocal Psychological:  Alert and cooperative. Normal mood and affect  Assessment and Recommendations: -SOB and fatigue:  Likely multifactorial, but acutely her anemia and GI issues are  stable.  -Chronic GI blood loss and anemia from AVMs with history of angiodysplasia of the stomach and colon.  Stable.  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 Etiology is likely due to autoimmune disease.  Stable.  *Will recheck CBC in one week.  She is to follow up with her cardiologist and pulmonologist as well.  I told her to monitor her weights daily.  Can use gaviscon of mylicon drops for gas/bloating.  **I discussed this case with Dr. Leone Payor who agreed with the plan.

## 2012-10-22 ENCOUNTER — Other Ambulatory Visit: Payer: Self-pay | Admitting: *Deleted

## 2012-10-22 ENCOUNTER — Other Ambulatory Visit (INDEPENDENT_AMBULATORY_CARE_PROVIDER_SITE_OTHER): Payer: Medicare Other

## 2012-10-22 DIAGNOSIS — D649 Anemia, unspecified: Secondary | ICD-10-CM

## 2012-10-22 DIAGNOSIS — D509 Iron deficiency anemia, unspecified: Secondary | ICD-10-CM

## 2012-10-22 LAB — CBC WITH DIFFERENTIAL/PLATELET
Eosinophils Relative: 4.4 % (ref 0.0–5.0)
HCT: 26.5 % — ABNORMAL LOW (ref 36.0–46.0)
Lymphocytes Relative: 13 % (ref 12.0–46.0)
Lymphs Abs: 0.4 10*3/uL — ABNORMAL LOW (ref 0.7–4.0)
Monocytes Relative: 10.8 % (ref 3.0–12.0)
Neutrophils Relative %: 71.7 % (ref 43.0–77.0)
Platelets: 84 10*3/uL — ABNORMAL LOW (ref 150.0–400.0)
WBC: 3.4 10*3/uL — ABNORMAL LOW (ref 4.5–10.5)

## 2012-10-22 MED ORDER — FLUCONAZOLE 100 MG PO TABS: ORAL_TABLET | ORAL | Status: DC

## 2012-10-22 NOTE — Telephone Encounter (Signed)
Patient came for repeat CBC today and came up to office. States she has a yeast infection in her throat and would like rx for this. States she has had it before after having an EGD and it feels the same. Feels like something in her throat. Please, advise.

## 2012-10-22 NOTE — Telephone Encounter (Signed)
Diflucan 100mg, #3, 1 po qd x3, no refill 

## 2012-10-22 NOTE — Telephone Encounter (Signed)
Patient is on Zocor and this interacts with Diflucan. May patient hold Zocor while on Diflucan?

## 2012-10-22 NOTE — Telephone Encounter (Signed)
Spoke with patient and gave her Dr. Regino Schultze recommendations. She will hold Zocor while on Diflucan.

## 2012-10-22 NOTE — Addendum Note (Signed)
Addended by: Daphine Deutscher on: 10/22/2012 04:49 PM   Modules accepted: Orders

## 2012-10-22 NOTE — Telephone Encounter (Signed)
Hold Zocor while on Diflucan

## 2012-10-22 NOTE — Telephone Encounter (Signed)
Hold Zocor while on Diflucan 

## 2012-10-28 ENCOUNTER — Telehealth: Payer: Self-pay | Admitting: Pulmonary Disease

## 2012-10-28 MED ORDER — SITAGLIPTIN PHOSPHATE 100 MG PO TABS: 100.0000 mg | ORAL_TABLET | Freq: Every evening | ORAL | Status: DC

## 2012-10-28 MED ORDER — FLUTICASONE-SALMETEROL 250-50 MCG/DOSE IN AEPB: 1.0000 | INHALATION_SPRAY | Freq: Two times a day (BID) | RESPIRATORY_TRACT | Status: DC

## 2012-10-28 NOTE — Telephone Encounter (Signed)
Called and spoke with pts husband and he is aware of samples placed up front.  Nothing further is needed.

## 2012-11-06 ENCOUNTER — Telehealth: Payer: Self-pay | Admitting: Pulmonary Disease

## 2012-11-06 MED ORDER — MOXIFLOXACIN HCL 400 MG PO TABS: 400.0000 mg | ORAL_TABLET | Freq: Every day | ORAL | Status: DC

## 2012-11-06 NOTE — Telephone Encounter (Signed)
I spoke with pt and is aware of SN recs. Rx has been sent. Nothing further was needed

## 2012-11-06 NOTE — Telephone Encounter (Signed)
I spoke with pt and she c/o cough w/ yellow-green phlem, wheezing, chest tx, increase SOB, chest cong x 4 days. No f/c/s/n/v. Wearing 3 liters oxygen x 6 weeks now. She is requesting to have something called in. Please advise SN thanks Last OV 09/01/12 Pending 12/30/12 Allergies  Allergen Reactions  . Aspirin Other (See Comments)    Causes nosebleeds  . Penicillins Itching  . Theophylline Nausea And Vomiting

## 2012-11-06 NOTE — Telephone Encounter (Signed)
Per SN---  Call in avelox 400 mg  #7 1 daily until gone. thanks

## 2012-11-10 ENCOUNTER — Other Ambulatory Visit: Payer: Self-pay | Admitting: Pulmonary Disease

## 2012-11-12 ENCOUNTER — Encounter: Payer: Self-pay | Admitting: Adult Health

## 2012-11-12 ENCOUNTER — Other Ambulatory Visit (INDEPENDENT_AMBULATORY_CARE_PROVIDER_SITE_OTHER): Payer: Medicare Other

## 2012-11-12 ENCOUNTER — Ambulatory Visit (INDEPENDENT_AMBULATORY_CARE_PROVIDER_SITE_OTHER): Payer: Medicare Other | Admitting: Adult Health

## 2012-11-12 VITALS — BP 118/64 | HR 62 | Temp 99.2°F | Ht 64.0 in | Wt 223.0 lb

## 2012-11-12 DIAGNOSIS — R0602 Shortness of breath: Secondary | ICD-10-CM

## 2012-11-12 DIAGNOSIS — J42 Unspecified chronic bronchitis: Secondary | ICD-10-CM

## 2012-11-12 DIAGNOSIS — D649 Anemia, unspecified: Secondary | ICD-10-CM

## 2012-11-12 LAB — CBC WITH DIFFERENTIAL/PLATELET
Basophils Relative: 0.2 % (ref 0.0–3.0)
Eosinophils Relative: 3.9 % (ref 0.0–5.0)
HCT: 25.4 % — ABNORMAL LOW (ref 36.0–46.0)
Hemoglobin: 8.6 g/dL — ABNORMAL LOW (ref 12.0–15.0)
Lymphs Abs: 0.3 10*3/uL — ABNORMAL LOW (ref 0.7–4.0)
Monocytes Relative: 10.2 % (ref 3.0–12.0)
Neutro Abs: 3.2 10*3/uL (ref 1.4–7.7)
RBC: 2.66 Mil/uL — ABNORMAL LOW (ref 3.87–5.11)
WBC: 4.1 10*3/uL — ABNORMAL LOW (ref 4.5–10.5)

## 2012-11-12 LAB — BRAIN NATRIURETIC PEPTIDE: Pro B Natriuretic peptide (BNP): 180 pg/mL — ABNORMAL HIGH (ref 0.0–100.0)

## 2012-11-12 LAB — IBC PANEL: Saturation Ratios: 16.9 % — ABNORMAL LOW (ref 20.0–50.0)

## 2012-11-12 NOTE — Patient Instructions (Addendum)
Finish Avelox as directed. Mucinex DM twice daily as needed. For cough and congestion. Increased Lasix 40 mg extra tablet daily for the next 2 days. I will call with lab results. Followup with Dr. Kriste Basque in 4 weeks and as needed. Low salt diet. Please contact office for sooner follow up if symptoms do not improve or worsen or seek emergency care

## 2012-11-12 NOTE — Progress Notes (Signed)
Subjective:    Patient ID: Charlott Rakes, female    DOB: 01/17/1941, 72 y.o.   MRN: [id number]  HPI 72 y/o WF here for a follow up visit... she has mult med problems as noted below>   ~  October 25, 2011:  268mo ROV & Cynara is w/o complaints today; SEE 06/12/11 OV for prev note string...  We rechecked labs today (see below), no changes made...    COPD> on Home O2, Advair250, NEBS, Mucinex; remote smoker quit yrs ago; known obstructive & restrictive lung dis; she has lost 10# in the interval & feels as though her breathing is sl improved as a result; asked to continue the same meds for now.     HBP> on Aten50, Lasix40Bid, K20; BP= 120/72 & she has mult somatic complaints but no specific CP, palpit, ch in SOB or edema; continue low sodium 7 wt reduction.     CAD & AS/AI> known non-obstructive CAD; hx atypCP/ CWP; no angina & she remains way too sedentary; discussed risk factor reduction strategy & she has f/u Cards DrCooper soon.    Chol> on Simva40; not really on diet & we discussed this; last FLP 10/12 showed TChol 110, TG 116, HDL 34, LDL 53; continue same, needs exercise!    DM> on Lantus20, Metform1000Bid, Glimep4, Januvia100; needs much better diet & exercise program! Not checking BS at home; BS=276, A1c=6.8 (improved from prev) & Pharm indicates that she is filling pretty regularly...    Obesity> way too sedentary, ?on diet & she has finally lost a little wt (down 10# to 215# today); we discussed diet, exercise, wt reduction strategies...    {GERD, Angiodysplasia w/ chronic GI blood loss...    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 We will continue  to check labs every 68mo & ROV in 268mo...    DJD/ FM/ ?osteopenia/ VitD defic> she has mult somatic complaints & takes Tylenol prn; on VitD 50K weekly...    RLS> on Requip4Qhs & Sinemet25/100Bid from DrDohmeier w/ fair control of symptoms...    Anxiety/ Depression/ Psyche> on RX per Psychiatry DrHejazi (we don't have notes from him) & she does not know her meds, didn't bring med bottles or med list!  As best we can tell she is on Nuvigil, Pristiq, Wellbutrin, Risperdal, Seroquel, Xanax> all from Psychiatry & she is reminded to bring all bottles to every visit... LABS 3/13:  Chems- ok x BS=276 A1c=6.8;  CBC- Hg=11.2 Fe=71 WBC=4.2K, Plat=75K...  ~  Jan 13, 2012:  65mo ROV & post hosp visit> Hosp 3/29 - 11/26/11 w/ COPD exac & mult chronic medical issues; treated w/ Pred & Zithromax & improved;  disch on Oxygen 2L/min, Advair250Bid, Pred taper, Proair prn;  Now improved & approaching baseline, she has completed the Pred taper but noted incr SOB, cough, & yellow sputum==> she wants additional antibiotic rx & we wrote for Avelox x7d...  She has mult additional somatic complaints> dizzy, abd discomfort, RLS, etc; overall they note fewer good days and more bad days; he daugh has been in the hosp & it wears her out to go  for a visit...     Hx DM on Lantus plus Metformin500mg - 2Bid, Glimepiride 4mg  Qam, & Januvia 100mg /d> BS=191, A1c=6.9 and she will maintain her current regimen + diet & try to incr exercise...    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    We reviewed her prob list, meds, xrays and labs> see below>>  LABS 5/13:  FLP- at goals on Simva40 x HDL=32;  Chems- ok x BS=191 A1c=6.9 LFTs=sl elev;  CBC- pancytopenic w/ Hg=9.8 Fe=53;  TSH=3.50  ~  February 24, 2012:  6wk ROV & Temica has experienced slowly incr blood sugars requiring incr Lantus  doses- now at 40u daily & BS checks still in the 300s; in addition she is c/o worsening RLS- prev evaluated by DrDohmeier, Neurology & she was on Requip & Sinemet w/ better control but she stopped the Requip on her own some time ago w/ recurrent RLS symptoms...    Breathing is ok; BP controlled on Aten & Lasix; Chol prev ok on Simva40;  She remains on 6 psychotopic meds from Psychiatry, [name] but she never brings bottles & we don't have notes..    We reviewed prob list, meds, xrays and labs> see below>> LABS 7/13:  Chems- ok x BS=339;  A1c=7.4 REC to slowly incr Lantus by 5u increments until FBS <150...  ~  May 26, 2012:  19mo ROV & Frannie relates the following issues since last visit>>    She saw DrDohmeier for Neuro for f/u Tremors & she made several adjustments in her Psyche meds- stopped the Risperdal & decr the Seroquel to 100mg /d; pt notes that tremors are better but still signif (also on SinemetCR-Bid) & she claims "withdrawal" from the Risperdal cessation w/ fallx2/ "messed up my liver"/ "sugars went crazy"...  Initiation of Requip really has helped the RLS...    ?Labs by Neuro showed low iron by pt report (we don't have notes from DrDohmeier) & DrDBrodie ordered Iron infusion for pt- done 04/22/12 w/ 1000mg  Iron Dextran infused at day surg; she is due for f/u labs today> Hg=11.1  Fe=76    She is c/o choking while eating/ swallowing & she has f/u appt w/ DrDBrodie to see about repeat EGD w/ dilatation (last EGD 2011 showed normal esoph, GAVE syndrome w/ argon laser ablation)...    DM control "went crazy" she says> she has cut the Lantus to 25u/d, & still taking the Metform500-2Bid, Glim4/d, Januv100/d; BS at home varies betw 68-242 & labs today showed BS=164  A1c= 5.0     We reviewed prob list, meds, xrays and labs> see below for updates >> OK Flu vaccine today... LABS 10/13:  Chems- ok x BS=164 A1c=5.0;  CBC- ok x Hg=11.1 WBC=4.2 Plat=86 Fe=76 (23%)  ~  September 01, 2012:  19mo ROV & Chelcy  is c/o a URI "cold in my chest" w/ cough, discolored phlegm "diff colors at diff times", chest congestion, etc; she notes- tired all the time, HH is bothering her, BS at home betw 170-200 range... We reviewed the following medical problems during today's office visit>>      COPD> on Home O2, Advair250, NEBS, Mucinex; remote smoker quit yrs ago; known obstructive & restrictive lung dis; she had ZPak recently but persists w/ c/o discolored phlegm, no f/c/s, vague chest discomfort; last CXR 3/13 showed linear scarring left mid lung, otherw  clear; CTA 3/13 was neg for PE & showed some atelec, coronary calcif, multilevel spondylosis, & cirrhosis of the liver; she declined f/u film today- we decided to treat w/ Levaquin, Mucinex, Fluids, Hycodan...    HBP> on Aten50, Lasix40, K20; BP= 118/82 & she has mult somatic complaints but no specific CP, palpit, ch in SOB or edema; continue low sodium & wt reduction.     CAD & mild AS/AI> known non-obstructive CAD; hx atypCP/ CWP; no angina & she remains way too sedentary; discussed risk factor reduction strategy & she saw DrCooper 7/13> stable, no angina, too sedentary; f/u 2DEcho w/ mild LVH, norm LVF w/ EF=55-60% & norm wall motion, mils AS & AI, Gr2DD...    Chol> on Simva40; not really on diet & we discussed this; last FLP 5/13 showed TChol 94, TG 130, HDL 32, LDL 36; continue same, needs exercise!    DM> on Lantus25, Metform1000Bid, Glimep2, [id number]; needs much better diet & exercise program! Not checking BS at home; labs from 10/13 showed BS=164, A1c=5.0 so we decreased the Glimep from 4mg  to 2mg  each AM...    Obesity> way too sedentary, ?on diet & her wt is up to 209# today; we discussed diet, exercise, wt reduction strategies...    << Chronic GI problems followed by DrDBrodie >>    {GERD, Angiodysplasia w/ chronic GI blood loss >> see below... 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 We will continue to check labs every 40mo w/ Iron infusions of Hg is reduced & ROV in 41mo...    DJD/ FM/ ?osteopenia/ VitD defic> she has mult somatic complaints & takes Tylenol prn; on VitD 50K weekly...    RLS> on Requip4Qhs & Sinemet25/100Bid from DrDohmeier w/ fair control of symptoms- her note is reviewed...    Anxiety/ Depression/ Psyche> on RX per Psychiatry DrHejazi (we don't have notes from him) & she does not know her meds, didn't bring med bottles or med list!  As best we can tell she is on Nuvigil, Pristiq, Wellbutrin, Risperdal, Seroquel, Xanax> all from Psychiatry & she is reminded to bring all bottles to every visit... We reviewed prob list, meds, xrays and labs> see below for updates >>  LABS 1/14:  CBC- Hg=7.0 (down 4pts) Hct=21.9 Fe=51 (11%sat) B12=623, Protime=11.6/ 1.1, AFP=pending; Abd sonar per DrDBrodie- pending...   11/12/2012 Acute OV  Complains of cough w/ thick, yellow phlem, increase SOB, nasal congestion, PND, chest congestion, mouth is sore (? yeast infection). Pt states she has gained 10 lbs since jan 7. Denies any wheezing, no chest tx. Pt started Avelox 1 week ago and feels like she is getting worse. Has 1 avelox left.  CXR 1 month ago with NAD.  More DOE  Over last 2 weeks.  Coughing up thick white yellow mucus  Last blood and iron transfusion 08/2012 . Hbg 8.8 2/27 .  Last echo showed grade 2 diastolic dysfxn . 02/2012 , nml EF , mild LVH  On lasix 40mg  daily , no missed doses.  No hemoptysis , fever, chest pain , orthopnea or n/v.            Problem List:  COPD (ICD-496) & BRONCHITIS, RECURRENT (ICD-491.9) - Ex-smoker w/ underlying COPD on  home O2, ADVAIR 250Bid, ALBUTEROL (via nebs or MDI for Prn use), & MUCINEX 1-2Bid... she has combined obstructive AND restrictive disease (based on her obesity) w/ diet (weight  reduction) + exercise strongly recommended to the pt...  ~  CTChest 5/06 = neg. ~  baseline CXR w/ bilat LL scarring, NAD.... last CXR 3/09 = unchanged, NAD. ~  PFTs 3/09 showed FVC= 2.47 (83%), FEV1= 1.65 (69%), %1sec= 67, mid-flows= 28%pred. ~  CXR 4/12 showed bilat scarring, NAD... ~  PFT 10/12 showed FVC= 2.28 (77%), FEV1= 1.46 (64%), %1sec= 64, mid-flows= 31% pred. ~  CXR 3/13 showed normal heart size, prom hilar regions, no infiltrates etc, NAD.[name]. ~  CTAngio Chest 3/13 showed neg for PE- scarring at lung bases, no adenopathy or lung lesions, calcified coronaries, multilevel spondylosis in TSpine, cirrhosis in liver  HYPERTENSION (ICD-401.9) - on ATENOLOL 50mg /d,  LASIX 40mg /d & K20/d...  ~  10/11:  BP=116/78 and she has mult somatic complaints but all are diminished compared to prev visits; denies bad HA's, CP, palpit, syncope, edema, etc... ~  4/12:  BP= 140/60 and she persists w/ mult somatic complaints... ~  10/12:  BP= 122/74 & she has mult somatic complaints; needs better low sodium, calorie restricted diet & wt reduction... ~  3/13:  BP= 120/72 & she is unchanged symptomatically;  EKG 3/13 showed NSR, rate68, LAD, late transition, NSSTTWA... ~  7/13:  BP= 110/62 & she persists w/ mult somatic complaints... ~  10/13:  BP= 118/60 & she remains clinically stable...  CAD (ICD-414.00) - Hosp at [facility] 4[facility]09 w/ CP... cath showed non-obstructive CAD... no CP, palpit, etc (but she is way too sedentary). ~  cath 4/09 by DrCooper showed normLMAIN, 20% midLAD, norm CIRC, 20-30% proxRCA, EF= 50-55%... ~  EKG 1/11 showed SBrady, rate54, ?septal infarct, NSSTTWA, NAD... ~  EKG 3/13 showed NSR, rate68, LAD, late transition, NSSTTWA... ~  CTAngio Chest 3/13 showed calcif coronaries as noted... ~  DrCooper 7/13> stable, no angina, too sedentary; f/u 2DEcho w/ mild LVH, norm LVF w/ EF=55-60% & norm wall motion, mils AS & AI, Gr2DD.Marland KitchenMarland Kitchen  PALPITATIONS (ICD-785.1) - eval by DrKlein w/ 7 beats WT on  monitor... he changed Lisinopril to BBlocker (currently ATENOLOL 50mg /d) and improved... ~  she saw DrCopper for Cards f/u 1/11- palpit well controlled on Atenolol; hx non-obstructive CAD & good LVF; no change in Rx.  AORTIC STENOSIS (ICD-424.1) - she has mild Ao Valve disease w/ AS/ AI... followed by DrCooper. ~  2DEcho 11/06 showed mild ca++ AoV w/ mild AS, norm LVF w/ EF=55-65% ~  repeat 2DEcho 1/09 showed similar mild ca++ AoV w/ mild AS, mild AI, no regional wall motion abn, norm LVf w/ EF= 60%... NOTE: est PAsys= 35... ~  repeat 2DEcho 1/10 showed norm LVF w/ EF= 55-60%, no regional wall motion abn, mild DD, mod calcif AoV & mild reduced leaflet excursion & mild AI... ~  Repeat 2DEcho 7/13 showed norm LVF w/ EF=55-65%, no regional wall motion abn, Gr2DD, mildAS, mildAI, mild LAdil... ~  DrCooper 7/13> stable, no angina, too sedentary; f/u 2DEcho w/ mild LVH, norm LVF w/ EF=55-60% & norm wall motion, mils AS & AI, Gr2DD...  HYPERCHOLESTEROLEMIA (ICD-272.0) - on SIMVASTATIN 40mg /d... ~  FLP 6/09 showed TChol 107, TG 69, HDL 35, LDL 59 ~  FLP 3/10 showed TChol 128, TG 125, HDL 33, LDL 70 ~  FLP 12/10 showed TChol 98, TG 64, HDL 37, LDL 48... continue same. ~  FLP 10/11 showed TChol 124,  TG 105, HDL 37, LDL 66 ~  FLP 4/12 showed TChol 122, TG 95, HDL 37, LDL 66 ~  FLP 10/12 on Simva40 showed TChol 110, TG 116, HDL 34, LDL 53 ~  FLP 5/13 on Simva40 showed TChol 94, TG 130, HDL 32, LDL 36  DM (ICD-250.00) - hx irreg control, not really on diet... on LANTUS 25u daily,  METFORMIN 500mg -2Bid,  GLIMEPIRIDE 4mg /d,  JANUVIA 100mg /d...  ~  labs 10/08 BS=198, HgA1C=7.2 ~  labs 1/09 showed BS=119, HgA1c=6.0 ~  labs 6/09 showed BS= 248, HgA1c= 7.0..[name] discussed need for starting insulin if she can't get on track and get wt down... ~  labs 9/09 showed BS= 236, HgA1c= 7.6.Marland KitchenMarland Kitchen rec- start Lantus 5u/d... grad increased to 10u. ~  labs 11/09 showed BS= 166, HgA1c= 6.0.Marland KitchenMarland Kitchen rec- great! keep same. ~  labs 3/10  showed BS= 169, A1c= 6.8 ~  labs 12/10 showed BS= 149, A1c= 5.8, Umicroalb= neg. ~  labs 10/11 showed BS= 215, A1c= 7.1.[name]. rec> better diet, get wt down. ~  Labs 4/12 showed BS= 172, A1c=5.8 ~  Labs 10/12 showed BS= 245, A1c= 8.1.Marland KitchenMarland Kitchen rec to take meds every day; incr Lantus dose 10==> 20u/d... ~  Labs 3/13 on Lantus20+16meds showed BS= 276, A1c= 6.8 (improved)... ~  4/13:  No diabetic retinopathy per optometrist at [facility]... ~  Labs 5/13 on Lantus20+57meds showed BS= 191, A1c= 6.9 ==> subsequently BS incr & Lantus incr ~  Labs 7/13 on Lantus40+61meds showed BS= 339, A1c= 7.4.Marland KitchenMarland Kitchen ?why control is off, rec move shots around & incr by 5u/wk til BS<200. ~  Labs 10/13 on Lantus25+58meds showed BS= 164, A1c= 5.0.Marland KitchenMarland Kitchen Too tight & rec to decr Lantus20 & decr Glimep4mg  to 1/2 tab...  MORBID OBESITY (ICD-278.01) - not really on diet or exercise program... ~  weight 6/09 = 223# ~  weight 11/09 = 228# ~  weight 3/10 = 225# ~  weight 6/10 = 217# ~  weight 9/10 = 213# ~  weight 12/10 = 224# ~  weight 2/11 = 216# ~  weight 10/11 = 230# ~  Weight 4/12 = 222# ~  Weight 10/12 = 225# ~  Weight 3/13 = 215#...  great job, keep it up... 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  ~  EGD  2/06 showed angiodysplasia & mult gastric pseudopolyps, watermelon stomach improved from 2003. ~  recent f/u EGD 9/09 showed chr gastritis, no watermelon stomach & improved from prev... ~  recurrent anemia 12/10 w/ f/u EGD 2/11 showing gastric antral avm's- s/p argon laser ablation, no varicies etc... ~  2011: she's been followed closely by GI DrDBrodie for her Autoimmune liver dis w/ Cirrhosis & splenomegaly, chr GI blood loss due to portal hypertensive gastropathy,  angiodysplasia, & colon polyps> off her prev Immuran Rx due to nausea. ~  5/13:  She is not on PPI and c/o some abd discomfort- rec starting PROTONIX 40mg /d... ~  She saw DrDBrodie 10/13 w/ c/o dysphagia + f/u of her autoimmune liver dis, portal HTN, cirrhosis, "watermelon stomach" & hx argon laser ablation of gastric AVMs (chr GI blood loss w/ periodic Fe infusions- last 8/13); EGD 10/13 showed norm esoph mucosa, no varicies, spasm at the LES but no stricture  found; dilated to 69F; mod severe portal hypertensive gastropathy in stomach, no active bleeding; REC> continue PPI, try Levsin...  DIVERTICULOSIS OF COLON (ICD-562.10) & POLYP, COLON (ICD-211.3) -  ~  last colonoscopy 10/08 w/ adenomatous polyp removed, and angiodysplasia without hemorrhage ~  CT Abd 10/08 showed Cirrhosis, borderline splenomeg, sl pancreatic atrophy & duct dil, sm umbil hernia, DJD sp, NAD... ~  10/13: she had f/u drDBrodie> last colonoscopy was 2008, she felt pt was too high risk for prep & sedation for a f/u colonoscopy & they decided to hold off...  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 ~  10/11:  presents w/ dysuria & UTI Rx w/ Cipro... ~  12/11:  She had f/u DrGrapey...  OSTEOARTHRITIS (ICD-715.90)  FIBROMYALGIA (ICD-729.1) - she persists w/ mult somatic complaints...  ? of OSTEOPOROSIS (ICD-733.00) - she had normal BMD in 2001 at [place]..  RESTLESS LEGS SYNDROME (ICD-333.94) - she prev weaned off the Requip & remains on SINEMET-CR 25-100Bid... ~  9/10: now c/o recurrent restless leg symptoms...  restart REQUIP 2mg  & titrate up. ~  12/10: persistant symptoms on 2mg Qhs... rec to incr to 4mg ... ~  2/11: she reports resolution of RLS on 4mg  Requip Qhs, resting well now. ~  12/11:  Recurrent RLS symptoms despite the Requip & seen by DrDohmeier w/ addition of Sinemet 25/100 Bid... ~  2012:  Stable on Requip4mg  & Sinemet 25/100 Bid... ~  7/13:  She is once again c/o incr RLS symptoms & rec to restart REQUIP 4mg /d...  ~  8/13: she had f/u visit w/ DrDohmeier> RLS, tremors, & insomnia; Seroquel helped for awhile, she is working w/ drHejazi to help her problems...  DYSTHYMIA (ICD-300.4) - Hx depression w/ psychotic features and hosp 2/09 at [facility] w/ delusions of pedunculosis> she was prev treated by Dr. Bartholomew Crews & DrSuttenfield for counselling...  Adm to [facility] in May09 by DrBarbSmith w/ depression and meds adjusted (see DC summary)...  Now followed by [name] and [name] at [facility]- she was re-admitted 10/09 and meds changed to RISPERDAL, PRISTIQUE, BUPROPION, SEROQUEL, XANAX & Nuvigil (Armodafinil)...  husb indicates that she is much better at present & continues outpt Rx... ~  MRI/ MRA Brain 2/08 showed sm vessel dis & atherosclerotic changes in post circ... ~  9/10: pt indicates that she is off the Trazodone... DrHejazi added Wellbutrin ?for her RLS? ~  2011:  pt continues regular f/u w/ Psychiatry on numerous meds w/ freq med adjustments; she is requested to get notes from [facility] to our attention & bring all med bottles to her OVs for review... ~  2012:  We have not received any notes from Psyche; pt has not complied w/ our request for med bottles to be brought to each OV... ~  2013:  We still don't have accurate list of her current meds as we have not received Psyche notes & she NEVER brings meds to office despite our requests...  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 ~  labs 04/18/08 by DrBrodie showed Hg= 10.4, Fe= 52... she ordered 1,200mg  IV Iron- given 04/26/08. ~  labs 05/19/08 showed Hg= 11.3, Fe= 117... ~  labs 11/09 showed Hg= 12.0.Marland KitchenMarland Kitchen ~  labs 3/10 showed Hg= 12.3, Fe= 79 ~  labs 6/10 showed Hg= 11.5, Fe= 73 ~  labs 12/10 showed Hg= 7.2, Fe= 28 (6%sat)... given [facility] & IV Fe per DrBrodie in Jan2012. ~  labs 2/11 showed Hg= 11.8, Fe= 95... pt indicates that DrBrodie will be following labs going forward... ~  labs 6/11 showed Hg= 11.2 ~  labs 9/11 showed Hg= 11.1, Fe= 106 ~  Labs 4/12 showed Hg=11.0, Fe=118 ~  Labs 10/12 showed Hg=9.5, Fe=35 (7%sat)... We will arrange for outpt infusion 1000mg  Fe... ~  Labs 12/12 showed Hg=11.4, Fe=92 (26%sat) ~  Labs 3/13 showed Hg=11.2, Fe=71 (17%sat) ~  Labs 5/13 showed Hg=9.8, Fe=53 (13%sat) ~  04/22/12:  She was given 1000mg  IV Iron Dextran by DrDBrodie... ~  Labs 10/13 showed Hg=11.1, Fe=76 (23%) ~  Labs 1/14 showed Hg=7.0 (down 4pts) Hct=21.9 Fe=51 (11%sat) B12=623, Protime=11.6/ 1.1, AFP=pending; Abd sonar per DrDBrodie- pending...     Past Surgical History  Procedure Laterality Date  . Hemorroidectomy    . Appendectomy    . Vaginal hysterectomy    . Breast biopsy      bilateral  . Cesarean section      x 3  . Appendectomy    . Esophagogastroduodenoscopy  06/12/2012    Procedure: ESOPHAGOGASTRODUODENOSCOPY (EGD);  Surgeon: Hart Carwin, MD;  Location: [facility];  Service: Endoscopy;  Laterality: N/A;  . Balloon dilation  06/12/2012    Procedure: BALLOON DILATION;  Surgeon: Hart Carwin, MD;  Location: [facility];  Service: Endoscopy;  Laterality: N/A;  ?balloon  . Esophagogastroduodenoscopy  09/10/2012    Procedure: ESOPHAGOGASTRODUODENOSCOPY (EGD);  Surgeon: Hart Carwin, MD;  Location: [facility];  Service: Endoscopy;  Laterality: N/A;  . Hot  hemostasis  09/10/2012    Procedure: HOT HEMOSTASIS (ARGON PLASMA COAGULATION/BICAP);  Surgeon: Hart Carwin, MD;  Location: [facility];  Service: Endoscopy;  Laterality: N/A;    Outpatient Encounter Prescriptions as of 11/12/2012  Medication Sig Dispense Refill  . albuterol (PROAIR HFA) 108 (90 BASE) MCG/ACT inhaler Inhale 2 puffs into the lungs every 6 (six) hours as needed.        . ALPRAZolam (XANAX) 1 MG tablet Take 1 mg by mouth at bedtime.       . Armodafinil (NUVIGIL) 150 MG tablet Take 150 mg by mouth daily.      [name] atenolol (TENORMIN) 50 MG tablet TAKE 1 TABLET BY MOUTH EVERY DAY  30 tablet  10  . b complex vitamins tablet Take 1 tablet by mouth daily.       . B-D ULTRAFINE III SHORT PEN 31G X 8 MM MISC USE AS DIRECTED  100 each  6  . buPROPion (WELLBUTRIN XL) 150 MG 24 hr tablet Take 150 mg by mouth daily.      . carbidopa-levodopa (SINEMET CR) 25-100 MG per tablet Take 1 tablet by mouth 2 (two) times daily. 1 at lunch and 1 at dinner      . desvenlafaxine (PRISTIQ) 100 MG 24 hr tablet Take 100 mg by mouth daily.      [name] dextromethorphan-guaiFENesin (MUCINEX DM) 30-600 MG per  12 hr tablet Take 1-2 tablets by mouth every 12 (twelve) hours as needed.      . Fluticasone-Salmeterol (ADVAIR DISKUS) 250-50 MCG/DOSE AEPB Inhale 1 puff into the lungs 2 (two) times daily.  1 each  0  . furosemide (LASIX) 40 MG tablet Take 1 tablet (40 mg total) by mouth daily.  30 tablet  6  . glimepiride (AMARYL) 4 MG tablet Take 1 tablet by mouth every morning      . HYDROcodone-homatropine (HYCODAN) 5-1.5 MG/5ML syrup Take 5 mLs by mouth every 6 (six) hours as needed for cough.  240 mL  1  . insulin glargine (LANTUS) 100 UNIT/ML injection Inject 25 Units into the skin daily.      . metFORMIN (GLUCOPHAGE) 500 MG tablet TAKE 2 TABLETS BY MOUTH TWICE A DAY  120 tablet  4  . moxifloxacin (AVELOX) 400 MG tablet Take 1 tablet (400 mg total) by mouth daily.  7 tablet  0  . Multiple Vitamins-Minerals (CENTRUM  SILVER PO) Take 1 tablet by mouth daily.       . pantoprazole (PROTONIX) 40 MG tablet Take 1 tablet (40 mg total) by mouth daily.  90 tablet  3  . polyethylene glycol powder (GLYCOLAX/MIRALAX) powder Take 17 g by mouth daily as needed.      . potassium chloride SA (K-DUR,KLOR-CON) 20 MEQ tablet Take 20 mEq by mouth daily.      . QUEtiapine (SEROQUEL) 100 MG tablet Take 300 mg by mouth at bedtime.       [name] rOPINIRole (REQUIP) 4 MG tablet TAKE 1 TABLET (4 MG TOTAL) BY MOUTH AT BEDTIME.  30 tablet  6  . simvastatin (ZOCOR) 40 MG tablet TAKE 1 TABLET BY MOUTH AT BEDTIME FOR CHOLESTEROL  30 tablet  6  . sitaGLIPtin (JANUVIA) 100 MG tablet Take 1 tablet (100 mg total) by mouth every evening.  14 tablet  0  . Vitamin D, Ergocalciferol, (DRISDOL) 50000 UNITS CAPS Take 1 capsule (50,000 Units total) by mouth every 7 (seven) days. Take on friday  5 capsule  11  . [DISCONTINUED] glimepiride (AMARYL) 4 MG tablet Take 1/2 tablet by mouth every morning  30 tablet  6  . [DISCONTINUED] polyethylene glycol powder (GLYCOLAX/MIRALAX) powder Take 17 g by mouth daily.  527 g  2  . fluconazole (DIFLUCAN) 100 MG tablet Take one po daily x 3 days.Hold Zocor while taking Diflucan.  3 tablet  0  . furosemide (LASIX) 40 MG tablet Take 40 mg by mouth daily.      . hyoscyamine (LEVSIN/SL) 0.125 MG SL tablet Place 1 tablet (0.125 mg total) under the tongue 3 (three) times daily before meals.  90 tablet  3  . JANUVIA 100 MG tablet TAKE 1 TABLET EVERY DAY AS DIRECTED  30 tablet  4  . levofloxacin (LEVAQUIN) 500 MG tablet Take 1 tablet (500 mg total) by mouth daily.  7 tablet  0  . promethazine (PHENERGAN) 25 MG tablet Take 25 mg by mouth every 6 (six) hours as needed.         No facility-administered encounter medications on file as of 11/12/2012.    Allergies  Allergen Reactions  . Aspirin Other (See Comments)    Causes nosebleeds  . Penicillins Itching  . Theophylline Nausea And Vomiting    Current Medications,  Allergies, Past Medical History, Past Surgical History, Family History, and Social History were reviewed in [employer] record.    Review of Systems  Constitutional:   No  weight loss, night sweats,  Fevers, chills,  +fatigue, or  lassitude.  HEENT:   No headaches,  Difficulty swallowing,  Tooth/dental problems, or  Sore throat,                No sneezing, itching, ear ache, nasal congestion, post nasal drip,   CV:  No chest pain,  Orthopnea, PND, swelling in lower extremities, anasarca, dizziness, palpitations, syncope.   GI  No heartburn, indigestion, abdominal pain, nausea, vomiting, diarrhea, change in bowel habits, loss of appetite, bloody stools.   Resp: N  No coughing up of blood.  No change in color of mucus.  No wheezing.  No chest wall deformity  Skin: no rash or lesions.  GU: no dysuria, change in color of urine, no urgency or frequency.  No flank pain, no hematuria   MS:  No joint pain or swelling.  No decreased range of motion.  No back pain.  Psych:  No change in mood or affect. No depression or anxiety.  No memory loss.          Objective:   Physical Exam     WD, Obese, 72 y/o WF in NAD... she is chr ill appearing... GENERAL:  Alert & oriented x 3... HEENT:  Ingenio/AT, pale conjuct, EOM-wnl, PERRLA, EACs-clear, TMs-wnl, NOSE-clear, THROAT-clear w/ dry MMs... NECK:  Supple w/ fairROM; no JVD; normal carotid impulses w/o bruits; no thyromegaly or nodules palpated; no lymphadenopathy. CHEST:  Clear to P & A; without wheezes/ rales/ or rhonchi heard... HEART:  Regular Rhythm;  gr 1/6 SEM without rubs or gallops... ABDOMEN:  Obese, soft & nontender w/ panniculus; normal bowel sounds; no organomegaly or masses detected. EXT: without deformities, mod arthritic changes; no varicose veins/ +venous insuffic/ tr edema. NEURO:  CN's intact;  no focal neuro deficits... DERM:  No lesions noted; no rash, pale complexion.

## 2012-11-12 NOTE — Addendum Note (Signed)
Addended by: Ozella Almond R on: 11/12/2012 11:01 AM   Modules accepted: Orders

## 2012-11-12 NOTE — Assessment & Plan Note (Addendum)
Slow to resolve flare with ? Volume overload vs worsening anemia  Check labs w/ bnp, cbc , ibc panel and cmet   Plan  Finish Avelox as directed. Mucinex DM twice daily as needed. For cough and congestion. Increased Lasix 40 mg extra tablet daily for the next 2 days. I will call with lab results. Followup with Dr. Kriste Basque in 4 weeks and as needed. Low salt diet. Please contact office for sooner follow up if symptoms do not improve or worsen or seek emergency care

## 2012-11-16 ENCOUNTER — Telehealth: Payer: Self-pay | Admitting: Pulmonary Disease

## 2012-11-16 NOTE — Telephone Encounter (Signed)
Pt returned Leigh's call. °

## 2012-11-16 NOTE — Telephone Encounter (Signed)
I spoke with pt and she is requesting her hemoglobin results. Pt aware TP out of the office today. Please advise thanks

## 2012-11-16 NOTE — Telephone Encounter (Signed)
[email or web address]  

## 2012-11-17 NOTE — Telephone Encounter (Signed)
Pt aware of results. Nothing further was needed

## 2012-11-17 NOTE — Telephone Encounter (Signed)
Please look at lab results from last week She was called already  Please clarify

## 2012-11-18 ENCOUNTER — Other Ambulatory Visit: Payer: Self-pay | Admitting: Pulmonary Disease

## 2012-11-19 ENCOUNTER — Telehealth: Payer: Self-pay | Admitting: *Deleted

## 2012-11-19 NOTE — Telephone Encounter (Signed)
Patient notified that labs are due. She will come and get them done.

## 2012-11-19 NOTE — Telephone Encounter (Signed)
Message copied by Daphine Deutscher on Thu Nov 19, 2012  9:20 AM ------      Message from: Daphine Deutscher      Created: Thu Oct 22, 2012  3:25 PM       Call and remind patient due for CBC for DB on 11/23/12 ------

## 2012-11-20 ENCOUNTER — Other Ambulatory Visit: Payer: Self-pay | Admitting: Pulmonary Disease

## 2012-11-23 ENCOUNTER — Other Ambulatory Visit: Payer: Self-pay | Admitting: *Deleted

## 2012-11-23 ENCOUNTER — Other Ambulatory Visit (INDEPENDENT_AMBULATORY_CARE_PROVIDER_SITE_OTHER): Payer: Medicare Other

## 2012-11-23 DIAGNOSIS — D649 Anemia, unspecified: Secondary | ICD-10-CM

## 2012-11-23 DIAGNOSIS — D508 Other iron deficiency anemias: Secondary | ICD-10-CM

## 2012-11-23 LAB — CBC WITH DIFFERENTIAL/PLATELET
Eosinophils Relative: 4.5 % (ref 0.0–5.0)
HCT: 26.1 % — ABNORMAL LOW (ref 36.0–46.0)
Hemoglobin: 8.8 g/dL — ABNORMAL LOW (ref 12.0–15.0)
Lymphs Abs: 0.4 10*3/uL — ABNORMAL LOW (ref 0.7–4.0)
Monocytes Relative: 8.1 % (ref 3.0–12.0)
Platelets: 94 10*3/uL — ABNORMAL LOW (ref 150.0–400.0)
RBC: 2.8 Mil/uL — ABNORMAL LOW (ref 3.87–5.11)
WBC: 4.8 10*3/uL (ref 4.5–10.5)

## 2012-11-25 ENCOUNTER — Other Ambulatory Visit (HOSPITAL_COMMUNITY): Payer: Self-pay | Admitting: Internal Medicine

## 2012-11-27 ENCOUNTER — Encounter (HOSPITAL_COMMUNITY): Payer: Self-pay

## 2012-11-27 ENCOUNTER — Encounter (HOSPITAL_COMMUNITY)
Admission: RE | Admit: 2012-11-27 | Discharge: 2012-11-27 | Disposition: A | Discharge status: Home / Self Care | Payer: Medicare Other | Source: Ambulatory Visit | Attending: Internal Medicine | Admitting: Internal Medicine

## 2012-11-27 VITALS — BP 150/60 | HR 61 | Temp 98.4°F | Resp 16

## 2012-11-27 DIAGNOSIS — D508 Other iron deficiency anemias: Secondary | ICD-10-CM | POA: Insufficient documentation

## 2012-11-27 MED ORDER — SODIUM CHLORIDE 0.9 % IV SOLN
Freq: Once | INTRAVENOUS | Status: AC
  Administered 2012-11-27: 16:00:00 via INTRAVENOUS

## 2012-11-27 MED ORDER — SODIUM CHLORIDE 0.9 % IV SOLN
1020.0000 mg | Freq: Once | INTRAVENOUS | Status: AC
  Administered 2012-11-27: 1020 mg via INTRAVENOUS
  Filled 2012-11-27: qty 34

## 2012-11-30 ENCOUNTER — Other Ambulatory Visit (HOSPITAL_COMMUNITY): Payer: Self-pay | Admitting: Critical Care Medicine

## 2012-12-15 ENCOUNTER — Telehealth: Payer: Self-pay | Admitting: *Deleted

## 2012-12-15 NOTE — Telephone Encounter (Signed)
Spoke to patient and reminded her of labs due.

## 2012-12-15 NOTE — Telephone Encounter (Signed)
Left a message for patient to call me. 

## 2012-12-16 ENCOUNTER — Other Ambulatory Visit (INDEPENDENT_AMBULATORY_CARE_PROVIDER_SITE_OTHER): Payer: Medicare Other

## 2012-12-16 DIAGNOSIS — D508 Other iron deficiency anemias: Secondary | ICD-10-CM

## 2012-12-16 LAB — CBC WITH DIFFERENTIAL/PLATELET
Basophils Relative: 0.4 % (ref 0.0–3.0)
Eosinophils Relative: 3.9 % (ref 0.0–5.0)
Lymphocytes Relative: 13.4 % (ref 12.0–46.0)
MCV: 97.2 fl (ref 78.0–100.0)
Monocytes Absolute: 0.4 10*3/uL (ref 0.1–1.0)
Monocytes Relative: 10 % (ref 3.0–12.0)
Neutrophils Relative %: 72.3 % (ref 43.0–77.0)
Platelets: 85 10*3/uL — ABNORMAL LOW (ref 150.0–400.0)
RBC: 2.67 Mil/uL — ABNORMAL LOW (ref 3.87–5.11)
WBC: 3.8 10*3/uL — ABNORMAL LOW (ref 4.5–10.5)

## 2012-12-17 ENCOUNTER — Telehealth: Payer: Self-pay | Admitting: *Deleted

## 2012-12-17 DIAGNOSIS — D509 Iron deficiency anemia, unspecified: Secondary | ICD-10-CM

## 2012-12-17 NOTE — Telephone Encounter (Signed)
From: Hart Carwin, MD Sent: 12/17/2012 1:06 PM To: Richardson Chiquito, CMA Please call pt with stable low blood count, Iron infusion did not help, it is not likely a blood loss problem. Repeat CBC in 6 weeks. ----- Message ----- Patient given result and recommendation. Lab in EPIC.

## 2012-12-30 ENCOUNTER — Ambulatory Visit (INDEPENDENT_AMBULATORY_CARE_PROVIDER_SITE_OTHER): Payer: Medicare Other | Admitting: Pulmonary Disease

## 2012-12-30 ENCOUNTER — Encounter: Payer: Self-pay | Admitting: Pulmonary Disease

## 2012-12-30 VITALS — BP 130/72 | HR 62 | Temp 98.5°F | Ht 64.0 in | Wt 221.4 lb

## 2012-12-30 DIAGNOSIS — I2510 Atherosclerotic heart disease of native coronary artery without angina pectoris: Secondary | ICD-10-CM

## 2012-12-30 DIAGNOSIS — R0989 Other specified symptoms and signs involving the circulatory and respiratory systems: Secondary | ICD-10-CM

## 2012-12-30 DIAGNOSIS — M1990 Unspecified osteoarthritis, unspecified site: Secondary | ICD-10-CM

## 2012-12-30 DIAGNOSIS — E669 Obesity, unspecified: Secondary | ICD-10-CM

## 2012-12-30 DIAGNOSIS — E7800 Pure hypercholesterolemia, unspecified: Secondary | ICD-10-CM

## 2012-12-30 DIAGNOSIS — K219 Gastro-esophageal reflux disease without esophagitis: Secondary | ICD-10-CM

## 2012-12-30 DIAGNOSIS — R0600 Dyspnea, unspecified: Secondary | ICD-10-CM

## 2012-12-30 DIAGNOSIS — K5730 Diverticulosis of large intestine without perforation or abscess without bleeding: Secondary | ICD-10-CM

## 2012-12-30 DIAGNOSIS — K754 Autoimmune hepatitis: Secondary | ICD-10-CM

## 2012-12-30 DIAGNOSIS — F333 Major depressive disorder, recurrent, severe with psychotic symptoms: Secondary | ICD-10-CM

## 2012-12-30 DIAGNOSIS — J449 Chronic obstructive pulmonary disease, unspecified: Secondary | ICD-10-CM

## 2012-12-30 DIAGNOSIS — D126 Benign neoplasm of colon, unspecified: Secondary | ICD-10-CM

## 2012-12-30 DIAGNOSIS — I359 Nonrheumatic aortic valve disorder, unspecified: Secondary | ICD-10-CM

## 2012-12-30 DIAGNOSIS — K5520 Angiodysplasia of colon without hemorrhage: Secondary | ICD-10-CM

## 2012-12-30 DIAGNOSIS — IMO0001 Reserved for inherently not codable concepts without codable children: Secondary | ICD-10-CM

## 2012-12-30 DIAGNOSIS — I10 Essential (primary) hypertension: Secondary | ICD-10-CM

## 2012-12-30 DIAGNOSIS — J441 Chronic obstructive pulmonary disease with (acute) exacerbation: Secondary | ICD-10-CM

## 2012-12-30 DIAGNOSIS — E119 Type 2 diabetes mellitus without complications: Secondary | ICD-10-CM

## 2012-12-30 DIAGNOSIS — D509 Iron deficiency anemia, unspecified: Secondary | ICD-10-CM

## 2012-12-30 MED ORDER — PROMETHAZINE HCL 25 MG PO TABS: 25.0000 mg | ORAL_TABLET | Freq: Four times a day (QID) | ORAL | Status: DC | PRN

## 2012-12-30 MED ORDER — CLONAZEPAM 1 MG PO TABS: 1.0000 mg | ORAL_TABLET | Freq: Two times a day (BID) | ORAL | Status: DC | PRN

## 2012-12-30 NOTE — Patient Instructions (Addendum)
Today we updated your med list in our EPIC system...    Continue your current medications the same...  We indicated you could take an extra 1/2  Lasix pill whenever you feel fluid overloaded...  For now let's add the KLONOPIN 1mg - one tab twice daily to help relax those chest wall muscles as we discussed   Call for any questions...  Let's plan a follow up visit in 20month for recheck.Marland KitchenMarland Kitchen

## 2012-12-31 ENCOUNTER — Other Ambulatory Visit: Payer: Self-pay | Admitting: Neurology

## 2013-01-03 ENCOUNTER — Other Ambulatory Visit: Payer: Self-pay | Admitting: Pulmonary Disease

## 2013-01-04 ENCOUNTER — Other Ambulatory Visit: Payer: Self-pay | Admitting: Pulmonary Disease

## 2013-01-05 ENCOUNTER — Encounter: Payer: Self-pay | Admitting: Cardiovascular Disease

## 2013-01-05 ENCOUNTER — Ambulatory Visit (INDEPENDENT_AMBULATORY_CARE_PROVIDER_SITE_OTHER): Payer: Medicare Other | Admitting: Cardiovascular Disease

## 2013-01-05 VITALS — BP 138/70 | HR 62 | Ht 64.0 in | Wt 216.0 lb

## 2013-01-05 DIAGNOSIS — I5032 Chronic diastolic (congestive) heart failure: Secondary | ICD-10-CM

## 2013-01-05 DIAGNOSIS — I2510 Atherosclerotic heart disease of native coronary artery without angina pectoris: Secondary | ICD-10-CM

## 2013-01-05 DIAGNOSIS — I359 Nonrheumatic aortic valve disorder, unspecified: Secondary | ICD-10-CM

## 2013-01-05 NOTE — Patient Instructions (Addendum)
Your physician recommends that you schedule a follow-up appointment as needed.   Your physician recommends that you continue on your current medications as directed. Please refer to the Current Medication list given to you today.  

## 2013-01-05 NOTE — Progress Notes (Signed)
HPI:  72 year old woman presenting for followup evaluation. The patient has nonobstructive coronary disease, palpitations, and chronic dyspnea. She underwent cardiac catheterization in 2009 demonstrating patent coronary arteries with mild nonobstructive disease. She had preserved left ventricular function. Her last echocardiogram in July 2013 showed mild aortic stenosis with diastolic dysfunction.  She is not doing well. She's on home oxygen. She's been struggling with anemia. She complains of shortness of breath with exertion. She denies chest pain or pressure. She has occasional edema. She feels like her abdomen bloats.  Outpatient Encounter Prescriptions as of 01/05/2013  Medication Sig Dispense Refill  . albuterol (PROAIR HFA) 108 (90 BASE) MCG/ACT inhaler Inhale 2 puffs into the lungs every 6 (six) hours as needed.        . ALPRAZolam (XANAX) 1 MG tablet Take 1 mg by mouth at bedtime.       . Armodafinil (NUVIGIL) 150 MG tablet Take 150 mg by mouth daily.      [name] atenolol (TENORMIN) 50 MG tablet TAKE 1 TABLET BY MOUTH EVERY DAY  30 tablet  10  . b complex vitamins tablet Take 1 tablet by mouth daily.       . B-D ULTRAFINE III SHORT PEN 31G X 8 MM MISC USE AS DIRECTED  100 each  6  . buPROPion (WELLBUTRIN XL) 150 MG 24 hr tablet Take 150 mg by mouth daily.      . carbidopa-levodopa (SINEMET CR) 25-100 MG per tablet TAKE 1 TABLET BY MOUTH AT LUNCHTIME AND TAKE 1 TABLET BY MOUTH AFTER DINNER  180 tablet  0  . desvenlafaxine (PRISTIQ) 100 MG 24 hr tablet Take 100 mg by mouth daily.      [name] dextromethorphan-guaiFENesin (MUCINEX DM) 30-600 MG per 12 hr tablet Take 1-2 tablets by mouth every 12 (twelve) hours as needed.      . Fluticasone-Salmeterol (ADVAIR DISKUS) 250-50 MCG/DOSE AEPB Inhale 1 puff into the lungs 2 (two) times daily.  1 each  0  . furosemide (LASIX) 40 MG tablet Take 1 tablet (40 mg total) by mouth daily.  30 tablet  6  . glimepiride (AMARYL) 4 MG tablet TAKE 1 TABLET EVERY MORNING   90 tablet  4  . JANUVIA 100 MG tablet TAKE 1 TABLET EVERY DAY AS DIRECTED  30 tablet  4  . LANTUS SOLOSTAR 100 UNIT/ML injection INJECT 30 UNITS INTO THE SKIN DAILY.  45 mL  6  . metFORMIN (GLUCOPHAGE) 500 MG tablet TAKE 2 TABLETS BY MOUTH TWICE A DAY  120 tablet  4  . Multiple Vitamins-Minerals (CENTRUM SILVER PO) Take 1 tablet by mouth daily.       . pantoprazole (PROTONIX) 40 MG tablet Take 1 tablet (40 mg total) by mouth daily.  90 tablet  3  . polyethylene glycol powder (GLYCOLAX/MIRALAX) powder Take 17 g by mouth daily as needed.      . potassium chloride SA (K-DUR,KLOR-CON) 20 MEQ tablet Take 20 mEq by mouth daily.      . promethazine (PHENERGAN) 25 MG tablet Take 1 tablet (25 mg total) by mouth every 6 (six) hours as needed.  30 tablet  6  . QUEtiapine (SEROQUEL) 100 MG tablet Take 400 mg by mouth at bedtime.       [name] rOPINIRole (REQUIP) 4 MG tablet TAKE 1 TABLET (4 MG TOTAL) BY MOUTH AT BEDTIME.  30 tablet  6  . simvastatin (ZOCOR) 40 MG tablet TAKE 1 TABLET BY MOUTH AT BEDTIME FOR CHOLESTEROL  30 tablet  6  . Vitamin D, Ergocalciferol, (DRISDOL) 50000 UNITS CAPS Take 1 capsule (50,000 Units total) by mouth every 7 (seven) days. Take on friday  5 capsule  11  . [DISCONTINUED] clonazePAM (KLONOPIN) 1 MG tablet Take 1 tablet (1 mg total) by mouth 2 (two) times daily as needed for anxiety.  60 tablet  0  . [DISCONTINUED] JANUVIA 100 MG tablet TAKE 1 TABLET EVERY DAY AS DIRECTED  30 tablet  6  . [DISCONTINUED] metFORMIN (GLUCOPHAGE) 500 MG tablet TAKE 2 TABLETS BY MOUTH TWICE A DAY  120 tablet  6   No facility-administered encounter medications on file as of 01/05/2013.    Allergies  Allergen Reactions  . Aspirin Other (See Comments)    Causes nosebleeds  . Penicillins Itching  . Theophylline Nausea And Vomiting    Past Medical History  Diagnosis Date  . Chronic airway obstruction, not elsewhere classified   . Unspecified chronic bronchitis   . Unspecified essential hypertension     . Coronary atherosclerosis of unspecified type of vessel, native or graft   . Palpitations   . Aortic valve disorders   . Pure hypercholesterolemia   . Morbid obesity   . Esophageal reflux   . Angiodysplasia of intestine (without mention of hemorrhage)   . Diverticulosis of colon (without mention of hemorrhage)   . Benign neoplasm of colon   . Autoimmune hepatitis   . Cystitis, unspecified   . Osteoarthrosis, unspecified whether generalized or localized, unspecified site   . Osteoporosis, unspecified   . Restless legs syndrome (RLS)   . Major depressive disorder, recurrent episode, severe, specified as with psychotic behavior   . Iron deficiency anemia secondary to blood loss (chronic)   . Coarse tremors     "just in my arms"  . Carpal tunnel syndrome on both sides   . Dysrhythmia     palpitations  . CHF (congestive heart failure)   . Asthma   . Shortness of breath     "any time really"  . Sleep apnea     "but I don't use the equipment"  . Type II diabetes mellitus   . Blood transfusion   . H/O hiatal hernia   . 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 Adenomatous colon polyp     ROS: Negative except as per HPI  BP 138/70  Pulse 62  Ht 5\' 4"  (1.626 m)  Wt 97.977 kg (216 lb)  BMI 37.06 kg/m2  SpO2 99%  PHYSICAL EXAM: Pt is alert and oriented, pleasant obese woman in NAD HEENT: normal Neck: JVP - normal, carotids 2+= without bruits Lungs: CTA bilaterally CV: RRR with grade 2/6 harsh systolic ejection murmur at per upper sternal border Abd: soft, NT, Positive BS, obese Ext: no C/C/E, distal pulses intact and equal Skin: warm/dry no rash  EKG:  Normal sinus rhythm 62 beats per minute, first grade AV block, nonspecific ST wave abnormality.  2-D echocardiogram 03/10/2012: Left ventricle: The cavity size was normal. Wall thickness was increased in a pattern of mild LVH. Systolic function was normal. The estimated ejection fraction was in the range of 55% to 65%.  Wall motion was normal; there were no regional wall motion abnormalities. Features are consistent with a pseudonormal left ventricular filling pattern, with concomitant abnormal relaxation and increased filling pressure (grade 2 diastolic dysfunction).  ------------------------------------------------------------ Aortic valve: Trileaflet. Doppler: There was mild stenosis. Mild regurgitation. VTI ratio of LVOT to aortic valve: 0.52. Indexed valve area: 0.82cm^2/m^2 (VTI). Peak  velocity ratio of LVOT to aortic valve: 0.45. Indexed valve area: 0.71cm^2/m^2 (Vmax). Mean gradient: 12mm Hg (S). Peak gradient: 25mm Hg (S).  ------------------------------------------------------------ Aorta: The aorta was normal, not dilated, and non-diseased.  ------------------------------------------------------------ Mitral valve: Calcified annulus. Leaflet separation was normal. Doppler: Transvalvular velocity was within the normal range. There was no evidence for stenosis. Trivial regurgitation. Indexed valve area by pressure half-time: 1.25cm^2/m^2. Peak gradient: 5mm Hg (D).  ------------------------------------------------------------ Left atrium: The atrium was mildly dilated.  ------------------------------------------------------------ Atrial septum: No defect or patent foramen ovale was identified.  ------------------------------------------------------------ Right ventricle: The cavity size was normal. Wall thickness was normal. Systolic function was normal.  ------------------------------------------------------------ Pulmonic valve: Structurally normal valve. Cusp separation was normal. Doppler: Transvalvular velocity was within the normal range. Mild regurgitation.  ------------------------------------------------------------ Tricuspid valve: Structurally normal valve. Leaflet separation was normal. Doppler: Transvalvular velocity was within the normal range. Trivial  regurgitation.  ------------------------------------------------------------ Pulmonary artery: The main pulmonary artery was normal-sized.  ------------------------------------------------------------ Right atrium: The atrium was normal in size.  ------------------------------------------------------------ Pericardium: The pericardium was normal in appearance.  ASSESSMENT AND PLAN: 1. Minor nonobstructive coronary artery disease 2. Chronic exertional dyspnea 3. Obesity 4. Anemia and pancytopenia 5. Chronic diastolic heart failure 6. Mild aortic stenosis  Ms. Ponzo seems to be declining from a general medical perspective. I think her overall cardiac situation is stable. Her aortic stenosis is mild. Dyspnea seems multifactorial. She has obesity, anemia, and diastolic dysfunction. I don't think she will significantly benefit from a medication changes today. She understands her obesity is a major factor but unfortunately she is not capable of increasing activity much. I'll be happy to see her back as needed in the future.  Tonny Bollman 01/05/2013 3:49 PM

## 2013-01-06 ENCOUNTER — Telehealth: Payer: Self-pay | Admitting: Pulmonary Disease

## 2013-01-06 NOTE — Telephone Encounter (Signed)
I spoke with pt and she stated SN ordered klonopin for her last week and she stated she received 2 pages of information on this medication. She stated she did not feel like this medication was for her and she stated she will just discuss this with SN at her next OV. Nothing further was needed

## 2013-01-14 ENCOUNTER — Encounter: Payer: Self-pay | Admitting: Pulmonary Disease

## 2013-01-14 ENCOUNTER — Telehealth: Payer: Self-pay | Admitting: Pulmonary Disease

## 2013-01-14 DIAGNOSIS — E119 Type 2 diabetes mellitus without complications: Secondary | ICD-10-CM

## 2013-01-14 NOTE — Telephone Encounter (Signed)
Called and spoke with pt and she stated that over the weekend her sugar had been up about 224 and she stated that she checked her sugar about 30 mins ago and her sugar was 376.  Pt stated that she has taken all of her meds today and everyday and she has not eaten today.  Pt is wanting to know what SN recs for this.  Please advise.     Allergies  Allergen Reactions  . Aspirin Other (See Comments)    Causes nosebleeds  . Penicillins Itching  . Theophylline Nausea And Vomiting

## 2013-01-14 NOTE — Telephone Encounter (Signed)
Per SN---  Increase water intake  Take lantus 10 units of insulin now Increase the morning dose of lantus to 40 units And we will get pt appt with Dr. Everardo All.  Order has been placed.   Called and spoke with pt and she is aware of SN recs and is aware that we will call her tomorrow with appt with Dr. Everardo All.

## 2013-01-14 NOTE — Progress Notes (Signed)
Subjective:    Patient ID: [name], female    DOB: [birth year], 72 y.o.   MRN: [id number]  HPI 72 y/o WF here for a follow up visit... she has mult med problems as noted below>   ~  October 25, 2011:  739mo ROV & [name] is w/o complaints today; SEE 06/12/11 OV for prev note string...  We rechecked labs today (see below), no changes made...    COPD> on Home O2, Advair250, NEBS, Mucinex; remote smoker quit yrs ago; known obstructive & restrictive lung dis; she has lost 10# in the interval & feels as though her breathing is sl improved as a result; asked to continue the same meds for now.     HBP> on Aten50, Lasix40Bid, K20; BP= 120/72 & she has mult somatic complaints but no specific CP, palpit, ch in SOB or edema; continue low sodium 7 wt reduction.     CAD & AS/AI> known non-obstructive CAD; hx atypCP/ CWP; no angina & she remains way too sedentary; discussed risk factor reduction strategy & she has f/u Cards DrCooper soon.    Chol> on Simva40; not really on diet & we discussed this; last FLP 10/12 showed TChol 110, TG 116, HDL 34, LDL 53; continue same, needs exercise!    DM> on Lantus20, Metform1000Bid, Glimep4, Januvia100; needs much better diet & exercise program! Not checking BS at home; BS=276, A1c=6.8 (improved from prev) & Pharm indicates that she is filling pretty regularly...    Obesity> way too sedentary, ?on diet & she has finally lost a little wt (down 10# to 215# today); we discussed diet, exercise, wt reduction strategies...    {GERD, Angiodysplasia w/ chronic GI blood loss...    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 We will continue  to check labs every 36mo & ROV in 739mo...    DJD/ FM/ ?osteopenia/ VitD defic> she has mult somatic complaints & takes Tylenol prn; on VitD 50K weekly...    RLS> on Requip4Qhs & Sinemet25/100Bid from DrDohmeier w/ fair control of symptoms...    Anxiety/ Depression/ Psyche> on RX per Psychiatry DrHejazi (we don't have notes from him) & she does not know her meds, didn't bring med bottles or med list!  As best we can tell she is on Nuvigil, Pristiq, Wellbutrin, Risperdal, Seroquel, Xanax> all from Psychiatry & she is reminded to bring all bottles to every visit... LABS 3/13:  Chems- ok x BS=276 A1c=6.8;  CBC- Hg=11.2 Fe=71 WBC=4.2K, Plat=75K...  ~  Jan 13, 2012:  39mo ROV & post hosp visit> Hosp 3/29 - 11/26/11 w/ COPD exac & mult chronic medical issues; treated w/ Pred & Zithromax & improved;  disch on Oxygen 2L/min, Advair250Bid, Pred taper, Proair prn;  Now improved & approaching baseline, she has completed the Pred taper but noted incr SOB, cough, & yellow sputum==> she wants additional antibiotic rx & we wrote for Avelox x7d...  She has mult additional somatic complaints> dizzy, abd discomfort, RLS, etc; overall they note fewer good days and more bad days; he daugh has been in the hosp & it wears her out to go  for a visit...     Hx DM on Lantus plus Metformin500mg - 2Bid, Glimepiride 4mg  Qam, & Januvia 100mg /d> BS=191, A1c=6.9 and she will maintain her current regimen + diet & try to incr exercise...    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    We reviewed her prob list, meds, xrays and labs> see below>>  LABS 5/13:  FLP- at goals on Simva40 x HDL=32;  Chems- ok x BS=191 A1c=6.9 LFTs=sl elev;  CBC- pancytopenic w/ Hg=9.8 Fe=53;  TSH=3.50  ~  February 24, 2012:  6wk ROV & Pattiann has experienced slowly incr blood sugars requiring incr Lantus  doses- now at 40u daily & BS checks still in the 300s; in addition she is c/o worsening RLS- prev evaluated by DrDohmeier, Neurology & she was on Requip & Sinemet w/ better control but she stopped the Requip on her own some time ago w/ recurrent RLS symptoms...    Breathing is ok; BP controlled on Aten & Lasix; Chol prev ok on Simva40;  She remains on 6 psychotopic meds from Psychiatry, [name] but she never brings bottles & we don't have notes..    We reviewed prob list, meds, xrays and labs> see below>> LABS 7/13:  Chems- ok x BS=339;  A1c=7.4 REC to slowly incr Lantus by 5u increments until FBS <150...  ~  May 26, 2012:  18mo ROV & [name] relates the following issues since last visit>>    She saw DrDohmeier for Neuro for f/u Tremors & she made several adjustments in her Psyche meds- stopped the Risperdal & decr the Seroquel to 100mg /d; pt notes that tremors are better but still signif (also on SinemetCR-Bid) & she claims "withdrawal" from the Risperdal cessation w/ fallx2/ "messed up my liver"/ "sugars went crazy"...  Initiation of Requip really has helped the RLS...    ?Labs by Neuro showed low iron by pt report (we don't have notes from DrDohmeier) & DrDBrodie ordered Iron infusion for pt- done 04/22/12 w/ 1000mg  Iron Dextran infused at day surg; she is due for f/u labs today> Hg=11.1  Fe=76    She is c/o choking while eating/ swallowing & she has f/u appt w/ DrDBrodie to see about repeat EGD w/ dilatation (last EGD 2011 showed normal esoph, GAVE syndrome w/ argon laser ablation)...    DM control "went crazy" she says> she has cut the Lantus to 25u/d, & still taking the Metform500-2Bid, Glim4/d, Januv100/d; BS at home varies betw 68-242 & labs today showed BS=164  A1c= 5.0     We reviewed prob list, meds, xrays and labs> see below for updates >> OK Flu vaccine today... LABS 10/13:  Chems- ok x BS=164 A1c=5.0;  CBC- ok x Hg=11.1 WBC=4.2 Plat=86 Fe=76 (23%)  ~  September 01, 2012:  18mo ROV & [name]  is c/o a URI "cold in my chest" w/ cough, discolored phlegm "diff colors at diff times", chest congestion, etc; she notes- tired all the time, HH is bothering her, BS at home betw 170-200 range... We reviewed the following medical problems during today's office visit>>     COPD> on Home O2, Advair250, NEBS, Mucinex; remote smoker quit yrs ago; known obstructive & restrictive lung dis; she had ZPak recently but persists w/ c/o discolored phlegm, no f/c/s, vague chest discomfort; last CXR 3/13 showed linear scarring left mid lung, otherw clear;  CTA 3/13 was neg for PE & showed some atelec, coronary calcif, multilevel spondylosis, & cirrhosis of the liver; she declined f/u film today- we decided to treat w/ Levaquin, Mucinex, Fluids, Hycodan...    HBP> on Aten50, Lasix40, K20; BP= 118/82 & she has mult somatic complaints but no specific CP, palpit, ch in SOB or edema; continue low sodium & wt reduction.     CAD & mild AS/AI> known non-obstructive CAD; hx atypCP/ CWP; no angina & she remains way too sedentary; discussed risk factor reduction strategy & she saw DrCooper 7/13> stable, no angina, too sedentary; f/u 2DEcho w/ mild LVH, norm LVF w/ EF=55-60% & norm wall motion, mils AS & AI, Gr2DD...    Chol> on Simva40; not really on diet & we discussed this; last FLP 5/13 showed TChol 94, TG 130, HDL 32, LDL 36; continue same, needs exercise!    DM> on Lantus25, Metform1000Bid, Glimep2, [id number]; needs much better diet & exercise program! Not checking BS at home; labs from 10/13 showed BS=164, A1c=5.0 so we decreased the Glimep from 4mg  to 2mg  each AM...    Obesity> way too sedentary, ?on diet & her wt is up to 209# today; we discussed diet, exercise, wt reduction strategies...    << Chronic GI problems followed by DrDBrodie >>    {GERD, Angiodysplasia w/ chronic GI blood loss >> see below... 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 We will continue to check labs every 35mo w/ Iron infusions of Hg is reduced & ROV in 68mo...    DJD/ FM/ ?osteopenia/ VitD defic> she has mult somatic complaints & takes Tylenol prn; on VitD 50K weekly...    RLS> on Requip4Qhs & Sinemet25/100Bid from DrDohmeier w/ fair control of symptoms- her note is reviewed...    Anxiety/ Depression/ Psyche> on RX per Psychiatry DrHejazi (we don't have notes from him) & she does not know her meds, didn't bring med bottles or med list!  As best we can tell she is on Nuvigil, Pristiq, Wellbutrin, Risperdal, Seroquel, Xanax> all from Psychiatry & she is reminded to bring all bottles to every visit... We reviewed prob list, meds, xrays and labs> see below for updates >>  LABS 1/14:  CBC- Hg=7.0 (down 4pts) Hct=21.9 Fe=51 (11%sat) B12=623, Protime=11.6/ 1.1, AFP=pending; Abd sonar per DrDBrodie- pending...  ~  Dec 30, 2012:  68mo ROV & Aphrodite is c/o incr SOB, having to wear her O2 all the time, and can't get a deep breath; she feels weak, states she's having a lot of nausea (wants phenergan refilled); she has had several interval visits w/ [facility] & DrDBrodie for f/u of her Cirrhosis (no ascites on sonar 1/14), watermelon stomach, GAVE syndrome, & chr blood loss anemia; on Protonix40 & had EGD w/ Argon Laser ablation of gastric AVM 1/14, followed by IV Iron therapy for Hg=7; her Hg improved to 9.3 then fell back to 8.8 & remained there (Fe improved from 51 to 177 after the infusion & has settled back to 70 since then)...  DrDBrodie asked that we recheck her from the Pulm standpoint due to her SOB>>     COPD> on Home O2, Advair250, NEBS, Mucinex; remote smoker quit yrs ago; known obstructive & restrictive  lung dis; last CXR 2/14 showed cardiomeg, no pulm edema, clear lungs, NAD; CTA 3/13 was neg for PE & showed some atelec, coronary  calcif, multilevel spondylosis, & cirrhosis of the liver; she saw TP 3/14 w/ incr cough, thick yellow sput, congestion & post nasal drip> took Avelox & Mucinex w/ some improvement;  We decided to evaluate further>>  PFT today showed FVC=2.41 (83%), FEV1=1.77 (80%), %1sec=74, mid-flows= 61% predicted (all c/w min obstructive dis, +superimposed restriction)...  Ambulatory O2 sats showed 93% sat on RA at rest w/ HR=60; 87% sat after one lap on RA w/ HR=62... Other factors include 12# weight gain, weight =221#, very sedentary lifestyle, anemia (she did not want me to recheck this as it is being followed by DrDBrodie), and severe anxiety...  She thinks a lot of her SOB is from CHF siting her wt gain etc but a recent BNP 3/14 was ok at 180, she has a follow up appt w/ DrCooper soon...  I told her that in my opinion the biggest current issue (and most improvable factor) is anxiety & "chest wall muscle spasm" making her tight & hard to get a good breath "IN" as she described it; I went through my long explanation for this problems & told her that the BEST therapy for this was a "combination relaxer" like KLONOPIN & rec that she try 1mg - 1/2 to 1 tab bid... All questions answered & she is agreeable to this plan...  ADDENDUM >> pt called back & indicated that after reading the Pharm handout that she decided NOT to take the Klonopin, doesn't want a substitute...          Problem List:  COPD (ICD-496) & BRONCHITIS, RECURRENT (ICD-491.9) - Ex-smoker w/ underlying COPD on home O2, ADVAIR 250Bid, ALBUTEROL (via nebs or MDI for Prn use), & MUCINEX 1-2Bid... she has combined obstructive AND restrictive disease (based on her obesity) w/ diet (weight reduction) + exercise strongly recommended to the pt...  ~  CTChest 5/06 = neg. ~  baseline CXR w/ bilat LL scarring, NAD.... last CXR 3/09 = unchanged, NAD. ~  PFTs 3/09 showed FVC= 2.47 (83%), FEV1= 1.65 (69%), %1sec= 67, mid-flows= 28%pred. ~  CXR 4/12 showed bilat  scarring, NAD... ~  PFT 10/12 showed FVC= 2.28 (77%), FEV1= 1.46 (64%), %1sec= 64, mid-flows= 31% pred. ~  CXR 3/13 showed normal heart size, prom hilar regions, no infiltrates etc, NAD.[name]. ~  CTAngio Chest 3/13 showed neg for PE- scarring at lung bases, no adenopathy or lung lesions, calcified coronaries, multilevel spondylosis in TSpine, cirrhosis in liver ~  CXR 2/14 showed cardiomeg, no pulm edema, clear lungs, NAD ~  PFT 5/14 showed FVC=2.41 (83%), FEV1=1.77 (80%), %1sec=74, mid-flows= 61% predicted (all c/w min obstructive dis, +superimposed restriction) ~  Ambulatory O2 sats showed 93% sat on RA at rest w/ HR=60; 87% sat after one lap on RA w/ HR=62  HYPERTENSION (ICD-401.9) - on ATENOLOL 50mg /d,  LASIX 40mg /d & K20/d...  ~  10/11:  BP=116/78 and she has mult somatic complaints but all are diminished compared to prev visits; denies bad HA's, CP, palpit, syncope, edema, etc... ~  4/12:  BP= 140/60 and she persists w/ mult somatic complaints... ~  10/12:  BP= 122/74 & she has mult somatic complaints; needs better low sodium, calorie restricted diet & wt reduction... ~  3/13:  BP= 120/72 & she is unchanged symptomatically;  EKG 3/13 showed NSR, rate68, LAD, late transition, NSSTTWA... ~  7/13:  BP= 110/62 & she persists  w/ mult somatic complaints... ~  10/13:  BP= 118/60 & she remains clinically stable... ~  5/14:  BP= 130/72  CAD (ICD-414.00) - Hosp at [facility] 4/09 w/ CP... cath showed non-obstructive CAD... no CP, palpit, etc (but she is way too sedentary). ~  cath 4/09 by DrCooper showed normLMAIN, 20% midLAD, norm CIRC, 20-30% proxRCA, EF= 50-55%... ~  EKG 1/11 showed SBrady, rate54, ?septal infarct, NSSTTWA, NAD... ~  EKG 3/13 showed NSR, rate68, LAD, late transition, NSSTTWA... ~  CTAngio Chest 3/13 showed calcif coronaries as noted... ~  DrCooper 7/13> stable, no angina, too sedentary; f/u 2DEcho w/ mild LVH, norm LVF w/ EF=55-60% & norm wall motion, mils AS & AI,  Gr2DD.Marland KitchenMarland Kitchen  PALPITATIONS (ICD-785.1) - eval by DrKlein w/ 7 beats WT on monitor... he changed Lisinopril to BBlocker (currently ATENOLOL 50mg /d) and improved... ~  she saw DrCopper for Cards f/u 1/11- palpit well controlled on Atenolol; hx non-obstructive CAD & good LVF; no change in Rx.  AORTIC STENOSIS (ICD-424.1) - she has mild Ao Valve disease w/ AS/ AI... followed by DrCooper. ~  2DEcho 11/06 showed mild ca++ AoV w/ mild AS, norm LVF w/ EF=55-65% ~  repeat 2DEcho 1/09 showed similar mild ca++ AoV w/ mild AS, mild AI, no regional wall motion abn, norm LVf w/ EF= 60%... NOTE: est PAsys= 35... ~  repeat 2DEcho 1/10 showed norm LVF w/ EF= 55-60%, no regional wall motion abn, mild DD, mod calcif AoV & mild reduced leaflet excursion & mild AI... ~  Repeat 2DEcho 7/13 showed norm LVF w/ EF=55-65%, no regional wall motion abn, Gr2DD, mildAS, mildAI, mild LAdil... ~  DrCooper 7/13> stable, no angina, too sedentary; f/u 2DEcho w/ mild LVH, norm LVF w/ EF=55-60% & norm wall motion, mils AS & AI, Gr2DD...  HYPERCHOLESTEROLEMIA (ICD-272.0) - on SIMVASTATIN 40mg /d... ~  FLP 6/09 showed TChol 107, TG 69, HDL 35, LDL 59 ~  FLP 3/10 showed TChol 128, TG 125, HDL 33, LDL 70 ~  FLP 12/10 showed TChol 98, TG 64, HDL 37, LDL 48... continue same. ~  FLP 10/11 showed TChol 124, TG 105, HDL 37, LDL 66 ~  FLP 4/12 showed TChol 122, TG 95, HDL 37, LDL 66 ~  FLP 10/12 on Simva40 showed TChol 110, TG 116, HDL 34, LDL 53 ~  FLP 5/13 on Simva40 showed TChol 94, TG 130, HDL 32, LDL 36  DM (ICD-250.00) - hx irreg control, not really on diet... on LANTUS 25u daily,  METFORMIN 500mg -2Bid,  GLIMEPIRIDE 4mg /d,  JANUVIA 100mg /d...  ~  labs 10/08 BS=198, HgA1C=7.2 ~  labs 1/09 showed BS=119, HgA1c=6.0 ~  labs 6/09 showed BS= 248, HgA1c= 7.0..[name] discussed need for starting insulin if she can't get on track and get wt down... ~  labs 9/09 showed BS= 236, HgA1c= 7.6.Marland KitchenMarland Kitchen rec- start Lantus 5u/d... grad increased to 10u. ~  labs  11/09 showed BS= 166, HgA1c= 6.0.Marland KitchenMarland Kitchen rec- great! keep same. ~  labs 3/10 showed BS= 169, A1c= 6.8 ~  labs 12/10 showed BS= 149, A1c= 5.8, Umicroalb= neg. ~  labs 10/11 showed BS= 215, A1c= 7.1.[name]. rec> better diet, get wt down. ~  Labs 4/12 showed BS= 172, A1c=5.8 ~  Labs 10/12 showed BS= 245, A1c= 8.1.Marland KitchenMarland Kitchen rec to take meds every day; incr Lantus dose 10==> 20u/d... ~  Labs 3/13 on Lantus20+70meds showed BS= 276, A1c= 6.8 (improved)... ~  4/13:  No diabetic retinopathy per optometrist at [facility]... ~  Labs 5/13 on Lantus20+27meds showed BS= 191, A1c=  6.9 ==> subsequently BS incr & Lantus incr ~  Labs 7/13 on Lantus40+68meds showed BS= 339, A1c= 7.4.Marland KitchenMarland Kitchen ?why control is off, rec move shots around & incr by 5u/wk til BS<200. ~  Labs 10/13 on Lantus25+30meds showed BS= 164, A1c= 5.0.Marland KitchenMarland Kitchen Too tight & rec to decr Lantus20 & decr Glimep4mg  to 1/2 tab... ~  She has been adjusting her meds on her own in the interval; asked to bring all med bottles to the office for review...  MORBID OBESITY (ICD-278.01) - not really on diet or exercise program... ~  weight 6/09 = 223# ~  weight 11/09 = 228# ~  weight 3/10 = 225# ~  weight 6/10 = 217# ~  weight 9/10 = 213# ~  weight 12/10 = 224# ~  weight 2/11 = 216# ~  weight 10/11 = 230# ~  Weight 4/12 = 222# ~  Weight 10/12 = 225# ~  Weight 3/13 = 215#...  great job, keep it up... 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  ~  EGD  2/06 showed angiodysplasia & mult gastric pseudopolyps, watermelon stomach improved from 2003. ~  recent f/u EGD 9/09 showed chr gastritis, no watermelon stomach & improved from prev... ~  recurrent anemia 12/10 w/ f/u EGD 2/11 showing gastric antral  avm's- s/p argon laser ablation, no varicies etc... 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 ~  5/13:  She is not on PPI and c/o some abd discomfort- rec starting PROTONIX 40mg /d... ~  She saw DrDBrodie 10/13 w/ c/o dysphagia + f/u of her autoimmune liver dis, portal HTN, cirrhosis, "watermelon stomach" & hx argon laser ablation of gastric AVMs (chr GI blood loss w/ periodic Fe infusions- last 8/13); EGD 10/13 showed norm esoph mucosa, no varicies, spasm at the LES but no stricture found; dilated to 33F; mod severe portal hypertensive gastropathy in stomach, no active bleeding; REC> continue PPI, try Levsin... ~  1/14:  She had EGD & Argon Laser ablation of gastric AVM by DrDBrodie...  DIVERTICULOSIS OF COLON (ICD-562.10) & POLYP, COLON (ICD-211.3) -  ~  last colonoscopy 10/08 w/ adenomatous polyp removed, and angiodysplasia without hemorrhage ~  CT Abd 10/08 showed Cirrhosis, borderline splenomeg, sl pancreatic atrophy & duct dil, sm umbil hernia, DJD sp, NAD... ~  10/13: she had f/u drDBrodie> last colonoscopy was 2008, she felt pt was too high risk for prep & sedation for a f/u colonoscopy & they decided to hold off...  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 ~  10/11:  presents w/ dysuria &  UTI Rx w/ Cipro... ~  12/11:  She had f/u DrGrapey...  OSTEOARTHRITIS  (ICD-715.90)  FIBROMYALGIA (ICD-729.1) - she persists w/ mult somatic complaints...  ? of OSTEOPOROSIS (ICD-733.00) - she had normal BMD in 2001 at [place]..  RESTLESS LEGS SYNDROME (ICD-333.94) - she prev weaned off the Requip & remains on SINEMET-CR 25-100Bid... ~  9/10: now c/o recurrent restless leg symptoms... restart REQUIP 2mg  & titrate up. ~  12/10: persistant symptoms on 2mg Qhs... rec to incr to 4mg ... ~  2/11: she reports resolution of RLS on 4mg  Requip Qhs, resting well now. ~  12/11:  Recurrent RLS symptoms despite the Requip & seen by DrDohmeier w/ addition of Sinemet 25/100 Bid... ~  2012:  Stable on Requip4mg  & Sinemet 25/100 Bid... ~  7/13:  She is once again c/o incr RLS symptoms & rec to restart REQUIP 4mg /d...  ~  8/13: she had f/u visit w/ DrDohmeier> RLS, tremors, & insomnia; Seroquel helped for awhile, she is working w/ drHejazi to help her problems...  DYSTHYMIA (ICD-300.4) - Hx depression w/ psychotic features and hosp 2/09 at [facility] w/ delusions of pedunculosis> she was prev treated by Dr. Bartholomew Crews & DrSuttenfield for counselling...  Adm to [facility] in May09 by DrBarbSmith w/ depression and meds adjusted (see DC summary)...  Now followed by [name] and [name] at [facility]- she was re-admitted 10/09 and meds changed to RISPERDAL, PRISTIQUE, BUPROPION, SEROQUEL, XANAX & Nuvigil (Armodafinil)...  husb indicates that she is much better at present & continues outpt Rx... ~  MRI/ MRA Brain 2/08 showed sm vessel dis & atherosclerotic changes in post circ... ~  9/10: pt indicates that she is off the Trazodone... DrHejazi added Wellbutrin ?for her RLS? ~  2011:  pt continues regular f/u w/ Psychiatry on numerous meds w/ freq med adjustments; she is requested to get notes from [facility] to our attention & bring all med bottles to her OVs for review... ~  2012:  We have not received any notes from Psyche; pt has not complied w/ our request  for med bottles to be brought to each OV... ~  2013:  We still don't have accurate list of her current meds as we have not received Psyche notes & she NEVER brings meds to office despite our requests...  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 ~  labs 04/18/08 by DrBrodie showed Hg= 10.4, Fe= 52... she ordered 1,200mg  IV Iron- given 04/26/08. ~  labs 05/19/08 showed Hg= 11.3, Fe= 117... ~  labs 11/09 showed Hg= 12.0.Marland KitchenMarland Kitchen ~  labs 3/10 showed Hg= 12.3, Fe= 79 ~  labs 6/10 showed Hg= 11.5, Fe= 73 ~  labs 12/10 showed Hg= 7.2, Fe= 28 (6%sat)... given [facility] & IV Fe per DrBrodie in Jan2012. ~  labs 2/11 showed Hg= 11.8, Fe= 95... pt indicates that DrBrodie will be following labs going forward... ~  labs 6/11 showed Hg= 11.2 ~  labs 9/11 showed Hg= 11.1, Fe= 106 ~  Labs 4/12 showed Hg=11.0, Fe=118 ~  Labs 10/12 showed Hg=9.5, Fe=35 (7%sat)... We will arrange for outpt infusion 1000mg  Fe... ~  Labs 12/12 showed Hg=11.4, Fe=92 (26%sat) ~  Labs 3/13 showed Hg=11.2, Fe=71 (17%sat) ~  Labs 5/13 showed Hg=9.8, Fe=53 (13%sat) ~  04/22/12:  She was given 1000mg  IV Iron Dextran by DrDBrodie... ~  Labs 10/13 showed Hg=11.1, Fe=76 (23%) ~  Labs 1/14 showed Hg=7.0 (down 4pts) Hct=21.9 Fe=51 (11%sat) B12=623, Protime=11.6/ 1.1, AFP=pending; Abd sonar per DrDBrodie- cirrhosis, no ascites. ~  Given IV Iron by DrDBrodie w/ Hg improved to 9.3 & Fe up to 177... ~  Subsequently her Hg settled back to 8.8 & Iron dropped to 70... The pt did not want me to recheck her labs- "DrBrodie does it"    Past Surgical History  Procedure Laterality Date  . Hemorroidectomy    . Appendectomy    . Vaginal hysterectomy    . Breast biopsy      bilateral  . Cesarean section      x 3  . Appendectomy    .  Esophagogastroduodenoscopy  06/12/2012    Procedure: ESOPHAGOGASTRODUODENOSCOPY (EGD);  Surgeon: Hart Carwin, MD;  Location: [facility];  Service: Endoscopy;  Laterality: N/A;  . Balloon dilation  06/12/2012    Procedure: BALLOON DILATION;  Surgeon: Hart Carwin, MD;  Location: [facility];  Service: Endoscopy;  Laterality: N/A;  ?balloon  . Esophagogastroduodenoscopy  09/10/2012    Procedure: ESOPHAGOGASTRODUODENOSCOPY (EGD);  Surgeon: Hart Carwin, MD;  Location: [facility];  Service: Endoscopy;  Laterality: N/A;  . Hot hemostasis  09/10/2012    Procedure: HOT HEMOSTASIS (ARGON PLASMA COAGULATION/BICAP);  Surgeon: Hart Carwin, MD;  Location: [facility];  Service: Endoscopy;  Laterality: N/A;    Outpatient Encounter Prescriptions as of 12/30/2012  Medication Sig Dispense Refill  . albuterol (PROAIR HFA) 108 (90 BASE) MCG/ACT inhaler Inhale 2 puffs into the lungs every 6 (six) hours as needed.        . ALPRAZolam (XANAX) 1 MG tablet Take 1 mg by mouth at bedtime.       . Armodafinil (NUVIGIL) 150 MG tablet Take 150 mg by mouth daily.      [name] atenolol (TENORMIN) 50 MG tablet TAKE 1 TABLET BY MOUTH EVERY DAY  30 tablet  10  . b complex vitamins tablet Take 1 tablet by mouth daily.       . B-D ULTRAFINE III SHORT PEN 31G X 8 MM MISC USE AS DIRECTED  100 each  6  . buPROPion (WELLBUTRIN XL) 150 MG 24 hr tablet Take 150 mg by mouth daily.      [name] desvenlafaxine (PRISTIQ) 100 MG 24 hr tablet Take 100 mg by mouth daily.      [name] dextromethorphan-guaiFENesin (MUCINEX DM) 30-600 MG per 12 hr tablet Take 1-2 tablets by mouth every 12 (twelve) hours as needed.      . Fluticasone-Salmeterol (ADVAIR DISKUS) 250-50 MCG/DOSE AEPB Inhale 1 puff into the lungs 2 (two) times daily.  1 each  0  . furosemide (LASIX) 40 MG tablet Take 1 tablet (40 mg total) by mouth daily.  30 tablet  6  . glimepiride (AMARYL) 4 MG tablet TAKE 1 TABLET EVERY MORNING  90 tablet  4  . LANTUS SOLOSTAR 100 UNIT/ML injection  INJECT 30 UNITS INTO THE SKIN DAILY.  45 mL  6  . Multiple Vitamins-Minerals (CENTRUM SILVER PO) Take 1 tablet by mouth daily.       . pantoprazole (PROTONIX) 40 MG tablet Take 1 tablet (40 mg total) by mouth daily.  90 tablet  3  . polyethylene glycol powder (GLYCOLAX/MIRALAX) powder Take 17 g by mouth daily as needed.      . potassium chloride SA (K-DUR,KLOR-CON) 20 MEQ tablet Take 20 mEq by mouth daily.      . promethazine (  PHENERGAN) 25 MG tablet Take 1 tablet (25 mg total) by mouth every 6 (six) hours as needed.  30 tablet  6  . QUEtiapine (SEROQUEL) 100 MG tablet Take 400 mg by mouth at bedtime.       [name] rOPINIRole (REQUIP) 4 MG tablet TAKE 1 TABLET (4 MG TOTAL) BY MOUTH AT BEDTIME.  30 tablet  6  . simvastatin (ZOCOR) 40 MG tablet TAKE 1 TABLET BY MOUTH AT BEDTIME FOR CHOLESTEROL  30 tablet  6  . Vitamin D, Ergocalciferol, (DRISDOL) 50000 UNITS CAPS Take 1 capsule (50,000 Units total) by mouth every 7 (seven) days. Take on friday  5 capsule  11  . metFORMIN (GLUCOPHAGE) 500 MG tablet TAKE 2 TABLETS BY MOUTH TWICE A DAY  120 tablet  4   No facility-administered encounter medications on file as of 12/30/2012.    Allergies  Allergen Reactions  . Aspirin Other (See Comments)    Causes nosebleeds  . Penicillins Itching  . Theophylline Nausea And Vomiting    Current Medications, Allergies, Past Medical History, Past Surgical History, Family History, and Social History were reviewed in [employer] record.    Review of Systems         See HPI - all other systems neg except as noted...      The patient complains of weight gain, dyspnea on exertion, muscle weakness, difficulty walking, and depression.  The patient denies anorexia, fever, weight loss, vision loss, decreased hearing, hoarseness, chest pain, syncope, peripheral edema, prolonged cough, headaches, hemoptysis, abdominal pain, melena, hematochezia, severe indigestion/heartburn, hematuria, incontinence,  suspicious skin lesions, transient blindness, unusual weight change, abnormal bleeding, enlarged lymph nodes, and angioedema.     Objective:   Physical Exam     WD, Obese, 72 y/o WF in NAD... she is chr ill appearing... GENERAL:  Alert & oriented x 3... HEENT:  [place]/AT, pale conjuct, EOM-wnl, PERRLA, EACs-clear, TMs-wnl, NOSE-clear, THROAT-clear w/ dry MMs... NECK:  Supple w/ fairROM; no JVD; normal carotid impulses w/o bruits; no thyromegaly or nodules palpated; no lymphadenopathy. CHEST:  Clear to P & A; without wheezes/ rales/ or rhonchi heard... HEART:  Regular Rhythm;  gr 1/6 SEM without rubs or gallops... ABDOMEN:  Obese, soft & nontender w/ panniculus; normal bowel sounds; no organomegaly or masses detected. EXT: without deformities, mod arthritic changes; no varicose veins/ +venous insuffic/ tr edema. NEURO:  CN's intact;  no focal neuro deficits... DERM:  No lesions noted; no rash, pale complexion.  RADIOLOGY DATA:  Reviewed in the EPIC EMR & discussed w/ the patient...  LABORATORY DATA:  Reviewed in the EPIC EMR & discussed w/ the patient...   Assessment & Plan:   DYSPNEA> clearly this is multifactorial w/ components from Lung dis, CHF, Obesity, inactivity, Anemia, etc; a large component is likely related to anxiety & chest "tightness" for which I rec Klonopin 1mf- 1/2 to 1 bid...  COPD>  Combined mild obstructive & mod restrictive lung dis; PFT showed sl deterioration in FEV1; multifactorial dyspnea including stress; REC> continue Advair, NEBS, O2, Mucinex, etc; get on diet/ exercise/ get weight down; continue Psyche f/u for help w/ anxiety...  HBP>  Controlled on low dose BBlocker, diuretic> continue same & work on weight reduction...  CAD>  Known nonobstructive dis, way too sedentary> we discussed diet + exercise...  AS>  Stable w/o angina, syncope, etc; she continues to f/u w/ DrCooper for Cards...  CHOL>  Stable on the Simva40...  DM>  Control is volatile & she is  asked to move shots around etc;  Continue home monitoring & regular f/u here...  OBESITY>  Reviewed diet + exercise needed...  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  Autoimmune hepatitis>  Also managed by DrBrodie, off the Immuran at present... She also has pancytopenia related to her cirrhosis.  RLS>  Notes from DrDohmeier reviewed> on Sinemet + needs to restart Requip...  Dysthymia>  followed by Psyche on mult meds...  Anemia>  This is being monitored & treated by GI...   Patient's Medications  New Prescriptions   No medications on file  Previous Medications   ALBUTEROL (PROAIR HFA) 108 (90 BASE) MCG/ACT INHALER    Inhale 2 puffs into the lungs every 6 (six) hours as needed.     ALPRAZOLAM (XANAX) 1 MG TABLET    Take 1 mg by mouth at bedtime.    ARMODAFINIL (NUVIGIL) 150 MG TABLET    Take 150 mg by mouth daily.   ATENOLOL (TENORMIN) 50 MG TABLET    TAKE 1 TABLET BY MOUTH EVERY DAY   B COMPLEX VITAMINS TABLET    Take 1 tablet by mouth daily.    B-D ULTRAFINE III SHORT PEN 31G X 8 MM MISC    USE AS DIRECTED   BUPROPION (WELLBUTRIN XL) 150 MG 24 HR TABLET    Take 150 mg by mouth daily.   DESVENLAFAXINE (PRISTIQ) 100 MG 24 HR TABLET    Take 100 mg by mouth daily.   DEXTROMETHORPHAN-GUAIFENESIN (MUCINEX DM) 30-600 MG PER 12 HR TABLET    Take 1-2 tablets by mouth every 12 (twelve) hours as needed.   FLUTICASONE-SALMETEROL (ADVAIR DISKUS) 250-50 MCG/DOSE AEPB    Inhale 1 puff into the lungs 2 (two) times daily.   FUROSEMIDE (LASIX) 40 MG TABLET    Take 1 tablet (40 mg total) by mouth daily.   GLIMEPIRIDE (AMARYL) 4 MG TABLET    TAKE 1 TABLET EVERY MORNING   LANTUS SOLOSTAR 100 UNIT/ML INJECTION    INJECT 30 UNITS INTO THE SKIN DAILY.   METFORMIN (GLUCOPHAGE) 500 MG TABLET    TAKE 2 TABLETS BY MOUTH TWICE A DAY   MULTIPLE VITAMINS-MINERALS (CENTRUM SILVER PO)    Take 1 tablet by mouth daily.    PANTOPRAZOLE (PROTONIX)  40 MG TABLET    Take 1 tablet (40 mg total) by mouth daily.   POLYETHYLENE GLYCOL POWDER (GLYCOLAX/MIRALAX) POWDER    Take 17 g by mouth daily as needed.   POTASSIUM CHLORIDE SA (K-DUR,KLOR-CON) 20 MEQ TABLET    Take 20 mEq by mouth daily.   QUETIAPINE (SEROQUEL) 100 MG TABLET    Take 400 mg by mouth at bedtime.    ROPINIROLE (REQUIP) 4 MG TABLET    TAKE 1 TABLET (4 MG TOTAL) BY MOUTH AT BEDTIME.   SIMVASTATIN (ZOCOR) 40 MG TABLET    TAKE 1 TABLET BY MOUTH AT BEDTIME FOR CHOLESTEROL   VITAMIN D, ERGOCALCIFEROL, (DRISDOL) 50000 UNITS CAPS    Take 1 capsule (50,000 Units total) by mouth every 7 (seven) days. Take on friday  Modified Medications   Modified Medication Previous Medication   CARBIDOPA-LEVODOPA (SINEMET CR) 25-100 MG PER TABLET carbidopa-levodopa (SINEMET CR) 25-100 MG per tablet      TAKE 1 TABLET BY MOUTH AT LUNCHTIME AND TAKE 1 TABLET BY MOUTH AFTER DINNER    Take 1 tablet by mouth 2 (two) times daily. 1 at lunch and 1 at dinner   JANUVIA 100  MG TABLET JANUVIA 100 MG tablet      TAKE 1 TABLET EVERY DAY AS DIRECTED    TAKE 1 TABLET EVERY DAY AS DIRECTED   PROMETHAZINE (PHENERGAN) 25 MG TABLET promethazine (PHENERGAN) 25 MG tablet      Take 1 tablet (25 mg total) by mouth every 6 (six) hours as needed.    Take 25 mg by mouth every 6 (six) hours as needed.    Discontinued Medications   CLONAZEPAM (KLONOPIN) 1 MG TABLET    Take 1 tablet (1 mg total) by mouth 2 (two) times daily as needed for anxiety.   FLUCONAZOLE (DIFLUCAN) 100 MG TABLET    Take one po daily x 3 days.Hold Zocor while taking Diflucan.   FUROSEMIDE (LASIX) 40 MG TABLET    Take 40 mg by mouth daily.   GLIMEPIRIDE (AMARYL) 4 MG TABLET    Take 1 tablet by mouth every morning   HYDROCODONE-HOMATROPINE (HYCODAN) 5-1.5 MG/5ML SYRUP    Take 5 mLs by mouth every 6 (six) hours as needed for cough.   HYOSCYAMINE (LEVSIN/SL) 0.125 MG SL TABLET    Place 1 tablet (0.125 mg total) under the tongue 3 (three) times daily before meals.    INSULIN GLARGINE (LANTUS) 100 UNIT/ML INJECTION    Inject 25 Units into the skin daily.   LEVOFLOXACIN (LEVAQUIN) 500 MG TABLET    Take 1 tablet (500 mg total) by mouth daily.   METFORMIN (GLUCOPHAGE) 500 MG TABLET    TAKE 2 TABLETS BY MOUTH TWICE A DAY   MOXIFLOXACIN (AVELOX) 400 MG TABLET    Take 1 tablet (400 mg total) by mouth daily.   SIMVASTATIN (ZOCOR) 40 MG TABLET    TAKE 1 TABLET BY MOUTH AT BEDTIME FOR CHOLESTEROL   SITAGLIPTIN (JANUVIA) 100 MG TABLET    Take 1 tablet (100 mg total) by mouth every evening.

## 2013-01-15 ENCOUNTER — Telehealth: Payer: Self-pay | Admitting: *Deleted

## 2013-01-15 ENCOUNTER — Other Ambulatory Visit (INDEPENDENT_AMBULATORY_CARE_PROVIDER_SITE_OTHER): Payer: Medicare Other

## 2013-01-15 ENCOUNTER — Other Ambulatory Visit: Payer: Self-pay | Admitting: *Deleted

## 2013-01-15 DIAGNOSIS — D649 Anemia, unspecified: Secondary | ICD-10-CM

## 2013-01-15 DIAGNOSIS — D509 Iron deficiency anemia, unspecified: Secondary | ICD-10-CM

## 2013-01-15 LAB — CBC WITH DIFFERENTIAL/PLATELET
Basophils Relative: 0.6 % (ref 0.0–3.0)
Eosinophils Relative: 4.1 % (ref 0.0–5.0)
Hemoglobin: 7.7 g/dL — CL (ref 12.0–15.0)
Lymphocytes Relative: 12.9 % (ref 12.0–46.0)
MCHC: 33.6 g/dL (ref 30.0–36.0)
MCV: 95.1 fl (ref 78.0–100.0)
Monocytes Absolute: 0.4 10*3/uL (ref 0.1–1.0)
Neutro Abs: 3 10*3/uL (ref 1.4–7.7)
Neutrophils Relative %: 72.3 % (ref 43.0–77.0)
RBC: 2.39 Mil/uL — ABNORMAL LOW (ref 3.87–5.11)
WBC: 4.2 10*3/uL — ABNORMAL LOW (ref 4.5–10.5)

## 2013-01-15 NOTE — Telephone Encounter (Signed)
Spoke with Dr. Juanda Chance, Hgb 7.7. Per Dr. Juanda Chance, patient needs 2 units PRBC's with Lasix 20 mg between unit 1 and 2. Spoke with Laverne at [facility] on 01/21/13 at 8:00 AM. Patient aware and orders in EPIC.

## 2013-01-16 ENCOUNTER — Other Ambulatory Visit: Payer: Self-pay | Admitting: Pulmonary Disease

## 2013-01-17 ENCOUNTER — Observation Stay (HOSPITAL_COMMUNITY)
Admission: EM | Admit: 2013-01-17 | Discharge: 2013-01-19 | Disposition: A | Discharge status: Home / Self Care | Payer: Medicare Other | Attending: Internal Medicine | Admitting: Internal Medicine

## 2013-01-17 DIAGNOSIS — I8510 Secondary esophageal varices without bleeding: Secondary | ICD-10-CM | POA: Insufficient documentation

## 2013-01-17 DIAGNOSIS — J42 Unspecified chronic bronchitis: Secondary | ICD-10-CM

## 2013-01-17 DIAGNOSIS — R002 Palpitations: Secondary | ICD-10-CM

## 2013-01-17 DIAGNOSIS — IMO0001 Reserved for inherently not codable concepts without codable children: Secondary | ICD-10-CM | POA: Diagnosis present

## 2013-01-17 DIAGNOSIS — I780 Hereditary hemorrhagic telangiectasia: Secondary | ICD-10-CM | POA: Insufficient documentation

## 2013-01-17 DIAGNOSIS — E7800 Pure hypercholesterolemia, unspecified: Secondary | ICD-10-CM

## 2013-01-17 DIAGNOSIS — M1990 Unspecified osteoarthritis, unspecified site: Secondary | ICD-10-CM

## 2013-01-17 DIAGNOSIS — K31819 Angiodysplasia of stomach and duodenum without bleeding: Secondary | ICD-10-CM

## 2013-01-17 DIAGNOSIS — K921 Melena: Principal | ICD-10-CM | POA: Insufficient documentation

## 2013-01-17 DIAGNOSIS — K7460 Unspecified cirrhosis of liver: Secondary | ICD-10-CM | POA: Insufficient documentation

## 2013-01-17 DIAGNOSIS — R251 Tremor, unspecified: Secondary | ICD-10-CM

## 2013-01-17 DIAGNOSIS — F333 Major depressive disorder, recurrent, severe with psychotic symptoms: Secondary | ICD-10-CM

## 2013-01-17 DIAGNOSIS — D500 Iron deficiency anemia secondary to blood loss (chronic): Secondary | ICD-10-CM

## 2013-01-17 DIAGNOSIS — I509 Heart failure, unspecified: Secondary | ICD-10-CM | POA: Insufficient documentation

## 2013-01-17 DIAGNOSIS — K5730 Diverticulosis of large intestine without perforation or abscess without bleeding: Secondary | ICD-10-CM

## 2013-01-17 DIAGNOSIS — R0609 Other forms of dyspnea: Secondary | ICD-10-CM

## 2013-01-17 DIAGNOSIS — E669 Obesity, unspecified: Secondary | ICD-10-CM

## 2013-01-17 DIAGNOSIS — N3000 Acute cystitis without hematuria: Secondary | ICD-10-CM

## 2013-01-17 DIAGNOSIS — N3090 Cystitis, unspecified without hematuria: Secondary | ICD-10-CM

## 2013-01-17 DIAGNOSIS — I359 Nonrheumatic aortic valve disorder, unspecified: Secondary | ICD-10-CM

## 2013-01-17 DIAGNOSIS — E119 Type 2 diabetes mellitus without complications: Secondary | ICD-10-CM

## 2013-01-17 DIAGNOSIS — D649 Anemia, unspecified: Secondary | ICD-10-CM

## 2013-01-17 DIAGNOSIS — K625 Hemorrhage of anus and rectum: Secondary | ICD-10-CM

## 2013-01-17 DIAGNOSIS — I472 Ventricular tachycardia, unspecified: Secondary | ICD-10-CM

## 2013-01-17 DIAGNOSIS — K754 Autoimmune hepatitis: Secondary | ICD-10-CM

## 2013-01-17 DIAGNOSIS — D126 Benign neoplasm of colon, unspecified: Secondary | ICD-10-CM

## 2013-01-17 DIAGNOSIS — G2581 Restless legs syndrome: Secondary | ICD-10-CM

## 2013-01-17 DIAGNOSIS — K5520 Angiodysplasia of colon without hemorrhage: Secondary | ICD-10-CM

## 2013-01-17 DIAGNOSIS — D509 Iron deficiency anemia, unspecified: Secondary | ICD-10-CM

## 2013-01-17 DIAGNOSIS — K319 Disease of stomach and duodenum, unspecified: Secondary | ICD-10-CM | POA: Insufficient documentation

## 2013-01-17 DIAGNOSIS — D62 Acute posthemorrhagic anemia: Secondary | ICD-10-CM | POA: Insufficient documentation

## 2013-01-17 DIAGNOSIS — K922 Gastrointestinal hemorrhage, unspecified: Secondary | ICD-10-CM

## 2013-01-17 DIAGNOSIS — J441 Chronic obstructive pulmonary disease with (acute) exacerbation: Secondary | ICD-10-CM

## 2013-01-17 DIAGNOSIS — I8500 Esophageal varices without bleeding: Secondary | ICD-10-CM

## 2013-01-17 DIAGNOSIS — I10 Essential (primary) hypertension: Secondary | ICD-10-CM

## 2013-01-17 DIAGNOSIS — M810 Age-related osteoporosis without current pathological fracture: Secondary | ICD-10-CM

## 2013-01-17 DIAGNOSIS — R1310 Dysphagia, unspecified: Secondary | ICD-10-CM

## 2013-01-17 DIAGNOSIS — F339 Major depressive disorder, recurrent, unspecified: Secondary | ICD-10-CM | POA: Insufficient documentation

## 2013-01-17 DIAGNOSIS — J449 Chronic obstructive pulmonary disease, unspecified: Secondary | ICD-10-CM | POA: Insufficient documentation

## 2013-01-17 DIAGNOSIS — I5033 Acute on chronic diastolic (congestive) heart failure: Secondary | ICD-10-CM

## 2013-01-17 DIAGNOSIS — D61818 Other pancytopenia: Secondary | ICD-10-CM

## 2013-01-17 DIAGNOSIS — I2510 Atherosclerotic heart disease of native coronary artery without angina pectoris: Secondary | ICD-10-CM

## 2013-01-17 DIAGNOSIS — J4489 Other specified chronic obstructive pulmonary disease: Secondary | ICD-10-CM

## 2013-01-17 DIAGNOSIS — K219 Gastro-esophageal reflux disease without esophagitis: Secondary | ICD-10-CM

## 2013-01-17 DIAGNOSIS — K766 Portal hypertension: Secondary | ICD-10-CM | POA: Insufficient documentation

## 2013-01-17 DIAGNOSIS — Z79899 Other long term (current) drug therapy: Secondary | ICD-10-CM | POA: Insufficient documentation

## 2013-01-17 NOTE — ED Notes (Signed)
Pt c/o GI bleed since January 2014, states it became "really bad" at 2215 this evening with heavy bleeding x1 hour. Pt not actively bleeding at this time. Pt on home O2 at 4L [place]. Pt scheduled to have transfusion this Thursday.

## 2013-01-18 ENCOUNTER — Encounter (HOSPITAL_COMMUNITY): Payer: Self-pay | Admitting: Internal Medicine

## 2013-01-18 ENCOUNTER — Encounter (HOSPITAL_COMMUNITY)
Admission: EM | Disposition: A | Discharge status: Home / Self Care | Payer: Self-pay | Source: Home / Self Care | Attending: Internal Medicine

## 2013-01-18 DIAGNOSIS — K5520 Angiodysplasia of colon without hemorrhage: Secondary | ICD-10-CM

## 2013-01-18 DIAGNOSIS — K625 Hemorrhage of anus and rectum: Secondary | ICD-10-CM

## 2013-01-18 DIAGNOSIS — K31819 Angiodysplasia of stomach and duodenum without bleeding: Secondary | ICD-10-CM

## 2013-01-18 DIAGNOSIS — I8500 Esophageal varices without bleeding: Secondary | ICD-10-CM

## 2013-01-18 DIAGNOSIS — D649 Anemia, unspecified: Secondary | ICD-10-CM

## 2013-01-18 HISTORY — PX: ESOPHAGOGASTRODUODENOSCOPY: SHX5428

## 2013-01-18 LAB — GLUCOSE, CAPILLARY
Glucose-Capillary: 271 mg/dL — ABNORMAL HIGH (ref 70–99)
Glucose-Capillary: 178 mg/dL — ABNORMAL HIGH (ref 70–99)
Glucose-Capillary: 258 mg/dL — ABNORMAL HIGH (ref 70–99)

## 2013-01-18 LAB — CBC
HCT: 21.9 % — ABNORMAL LOW (ref 36.0–46.0)
Hemoglobin: 7 g/dL — ABNORMAL LOW (ref 12.0–15.0)
MCH: 30.8 pg (ref 26.0–34.0)
MCHC: 32 g/dL (ref 30.0–36.0)
MCHC: 32.7 g/dL (ref 30.0–36.0)
MCV: 96.5 fL (ref 78.0–100.0)
Platelets: 86 10*3/uL — ABNORMAL LOW (ref 150–400)
Platelets: 84 10*3/uL — ABNORMAL LOW (ref 150–400)
RBC: 2.27 MIL/uL — ABNORMAL LOW (ref 3.87–5.11)
RDW: 16.5 % — ABNORMAL HIGH (ref 11.5–15.5)
RDW: 19.7 % — ABNORMAL HIGH (ref 11.5–15.5)
WBC: 3.2 10*3/uL — ABNORMAL LOW (ref 4.0–10.5)
WBC: 3.8 10*3/uL — ABNORMAL LOW (ref 4.0–10.5)

## 2013-01-18 LAB — COMPREHENSIVE METABOLIC PANEL
ALT: 30 U/L (ref 0–35)
AST: 34 U/L (ref 0–37)
Albumin: 2.8 g/dL — ABNORMAL LOW (ref 3.5–5.2)
Alkaline Phosphatase: 111 U/L (ref 39–117)
BUN: 18 mg/dL (ref 6–23)
CO2: 23 mEq/L (ref 19–32)
Calcium: 9.4 mg/dL (ref 8.4–10.5)
Chloride: 98 mEq/L (ref 96–112)
Creatinine, Ser: 0.88 mg/dL (ref 0.50–1.10)
GFR calc Af Amer: 76 mL/min — ABNORMAL LOW (ref >90)
GFR calc non Af Amer: 66 mL/min — ABNORMAL LOW (ref >90)
Glucose, Bld: 299 mg/dL — ABNORMAL HIGH (ref 70–99)
Potassium: 3.9 mEq/L (ref 3.5–5.1)
Sodium: 135 mEq/L (ref 135–145)
Total Bilirubin: 0.5 mg/dL (ref 0.3–1.2)
Total Protein: 5.9 g/dL — ABNORMAL LOW (ref 6.0–8.3)

## 2013-01-18 LAB — PROTIME-INR: INR: 1.1 (ref 0.00–1.49)

## 2013-01-18 LAB — PRO B NATRIURETIC PEPTIDE: Pro B Natriuretic peptide (BNP): 167.9 pg/mL — ABNORMAL HIGH (ref 0–125)

## 2013-01-18 SURGERY — EGD (ESOPHAGOGASTRODUODENOSCOPY)
Anesthesia: Moderate Sedation

## 2013-01-18 MED ORDER — FUROSEMIDE 10 MG/ML IJ SOLN
20.0000 mg | Freq: Once | INTRAMUSCULAR | Status: AC
  Administered 2013-01-18: 20 mg via INTRAVENOUS

## 2013-01-18 MED ORDER — SODIUM CHLORIDE 0.9 % IJ SOLN
3.0000 mL | Freq: Two times a day (BID) | INTRAMUSCULAR | Status: DC
  Administered 2013-01-18 – 2013-01-19 (×2): 3 mL via INTRAVENOUS

## 2013-01-18 MED ORDER — OXYMETAZOLINE HCL 0.05 % NA SOLN
2.0000 | Freq: Four times a day (QID) | NASAL | Status: DC | PRN
  Filled 2013-01-18: qty 15

## 2013-01-18 MED ORDER — MIDAZOLAM HCL 10 MG/2ML IJ SOLN
INTRAMUSCULAR | Status: DC | PRN
  Administered 2013-01-18 (×5): 1 mg via INTRAVENOUS

## 2013-01-18 MED ORDER — SALINE SPRAY 0.65 % NA SOLN
1.0000 | NASAL | Status: DC | PRN
  Filled 2013-01-18: qty 44

## 2013-01-18 MED ORDER — PROMETHAZINE HCL 25 MG PO TABS: 25.0000 mg | ORAL_TABLET | Freq: Four times a day (QID) | ORAL | Status: DC | PRN

## 2013-01-18 MED ORDER — SIMVASTATIN 40 MG PO TABS
40.0000 mg | ORAL_TABLET | Freq: Every day | ORAL | Status: DC
  Administered 2013-01-18: 40 mg via ORAL
  Filled 2013-01-18 (×2): qty 1

## 2013-01-18 MED ORDER — QUETIAPINE FUMARATE 400 MG PO TABS
400.0000 mg | ORAL_TABLET | Freq: Every day | ORAL | Status: DC
  Administered 2013-01-18: 400 mg via ORAL
  Filled 2013-01-18 (×2): qty 1

## 2013-01-18 MED ORDER — SODIUM CHLORIDE 0.9 % IV SOLN
INTRAVENOUS | Status: DC
  Administered 2013-01-18 – 2013-01-19 (×2): via INTRAVENOUS

## 2013-01-18 MED ORDER — POTASSIUM CHLORIDE CRYS ER 20 MEQ PO TBCR
20.0000 meq | EXTENDED_RELEASE_TABLET | Freq: Every day | ORAL | Status: DC
  Administered 2013-01-18 – 2013-01-19 (×2): 20 meq via ORAL
  Filled 2013-01-18 (×2): qty 1

## 2013-01-18 MED ORDER — INSULIN ASPART 100 UNIT/ML ~~LOC~~ SOLN
0.0000 [IU] | Freq: Three times a day (TID) | SUBCUTANEOUS | Status: DC
  Administered 2013-01-18: 3 [IU] via SUBCUTANEOUS
  Administered 2013-01-18: 2 [IU] via SUBCUTANEOUS
  Administered 2013-01-19: 7 [IU] via SUBCUTANEOUS
  Administered 2013-01-19: 2 [IU] via SUBCUTANEOUS

## 2013-01-18 MED ORDER — CARBIDOPA-LEVODOPA CR 25-100 MG PO TBCR
1.0000 | EXTENDED_RELEASE_TABLET | ORAL | Status: DC
  Administered 2013-01-18 – 2013-01-19 (×2): 1 via ORAL
  Filled 2013-01-18 (×4): qty 1

## 2013-01-18 MED ORDER — ALPRAZOLAM 0.5 MG PO TABS
1.0000 mg | ORAL_TABLET | Freq: Every day | ORAL | Status: DC
  Administered 2013-01-18: 1 mg via ORAL
  Filled 2013-01-18: qty 2

## 2013-01-18 MED ORDER — VITAMIN D (ERGOCALCIFEROL) 1.25 MG (50000 UNIT) PO CAPS: 50000.0000 [IU] | ORAL_CAPSULE | ORAL | Status: DC

## 2013-01-18 MED ORDER — BUPROPION HCL ER (XL) 150 MG PO TB24
150.0000 mg | ORAL_TABLET | Freq: Every day | ORAL | Status: DC
  Administered 2013-01-18 – 2013-01-19 (×2): 150 mg via ORAL
  Filled 2013-01-18 (×2): qty 1

## 2013-01-18 MED ORDER — ROPINIROLE HCL 1 MG PO TABS
4.0000 mg | ORAL_TABLET | Freq: Every day | ORAL | Status: DC
  Administered 2013-01-18: 4 mg via ORAL
  Filled 2013-01-18 (×3): qty 4

## 2013-01-18 MED ORDER — FENTANYL CITRATE 0.05 MG/ML IJ SOLN
INTRAMUSCULAR | Status: DC | PRN
  Administered 2013-01-18 (×3): 25 ug via INTRAVENOUS

## 2013-01-18 MED ORDER — METFORMIN HCL 500 MG PO TABS
500.0000 mg | ORAL_TABLET | Freq: Two times a day (BID) | ORAL | Status: DC
  Administered 2013-01-18: 500 mg via ORAL
  Filled 2013-01-18 (×3): qty 1

## 2013-01-18 MED ORDER — SODIUM CHLORIDE 0.9 % IV SOLN: 250.0000 mL | INTRAVENOUS | Status: DC | PRN

## 2013-01-18 MED ORDER — MODAFINIL 200 MG PO TABS
200.0000 mg | ORAL_TABLET | Freq: Every day | ORAL | Status: DC
  Administered 2013-01-19: 200 mg via ORAL
  Filled 2013-01-18 (×3): qty 1

## 2013-01-18 MED ORDER — SIMETHICONE 80 MG PO CHEW
80.0000 mg | CHEWABLE_TABLET | Freq: Once | ORAL | Status: AC
  Administered 2013-01-18: 80 mg via ORAL
  Filled 2013-01-18: qty 1

## 2013-01-18 MED ORDER — SODIUM CHLORIDE 0.9 % IJ SOLN: 3.0000 mL | INTRAMUSCULAR | Status: DC | PRN

## 2013-01-18 MED ORDER — FENTANYL CITRATE 0.05 MG/ML IJ SOLN
INTRAMUSCULAR | Status: AC
  Filled 2013-01-18: qty 2

## 2013-01-18 MED ORDER — INSULIN ASPART 100 UNIT/ML ~~LOC~~ SOLN
0.0000 [IU] | Freq: Every day | SUBCUTANEOUS | Status: DC
  Administered 2013-01-18: 3 [IU] via SUBCUTANEOUS

## 2013-01-18 MED ORDER — VENLAFAXINE HCL ER 150 MG PO CP24
150.0000 mg | ORAL_CAPSULE | Freq: Every day | ORAL | Status: DC
  Administered 2013-01-18 – 2013-01-19 (×2): 150 mg via ORAL
  Filled 2013-01-18 (×3): qty 1

## 2013-01-18 MED ORDER — B COMPLEX-C PO TABS
1.0000 | ORAL_TABLET | Freq: Every day | ORAL | Status: DC
  Administered 2013-01-18 – 2013-01-19 (×2): 1 via ORAL
  Filled 2013-01-18 (×2): qty 1

## 2013-01-18 MED ORDER — MOMETASONE FURO-FORMOTEROL FUM 100-5 MCG/ACT IN AERO
2.0000 | INHALATION_SPRAY | Freq: Two times a day (BID) | RESPIRATORY_TRACT | Status: DC
  Administered 2013-01-18 – 2013-01-19 (×2): 2 via RESPIRATORY_TRACT
  Filled 2013-01-18: qty 8.8

## 2013-01-18 MED ORDER — ONDANSETRON HCL 4 MG/2ML IJ SOLN: 4.0000 mg | Freq: Four times a day (QID) | INTRAMUSCULAR | Status: DC | PRN

## 2013-01-18 MED ORDER — PANTOPRAZOLE SODIUM 40 MG PO TBEC
40.0000 mg | DELAYED_RELEASE_TABLET | Freq: Every day | ORAL | Status: DC
  Administered 2013-01-18 – 2013-01-19 (×2): 40 mg via ORAL
  Filled 2013-01-18 (×2): qty 1

## 2013-01-18 MED ORDER — FUROSEMIDE 10 MG/ML IJ SOLN
INTRAMUSCULAR | Status: AC
  Filled 2013-01-18: qty 4

## 2013-01-18 MED ORDER — BUTAMBEN-TETRACAINE-BENZOCAINE 2-2-14 % EX AERO
INHALATION_SPRAY | CUTANEOUS | Status: DC | PRN
  Administered 2013-01-18: 2 via TOPICAL

## 2013-01-18 MED ORDER — ONDANSETRON HCL 4 MG PO TABS: 4.0000 mg | ORAL_TABLET | Freq: Four times a day (QID) | ORAL | Status: DC | PRN

## 2013-01-18 MED ORDER — DM-GUAIFENESIN ER 30-600 MG PO TB12
1.0000 | ORAL_TABLET | Freq: Two times a day (BID) | ORAL | Status: DC
  Administered 2013-01-18 – 2013-01-19 (×3): 2 via ORAL
  Filled 2013-01-18 (×5): qty 2

## 2013-01-18 MED ORDER — ALBUTEROL SULFATE HFA 108 (90 BASE) MCG/ACT IN AERS
2.0000 | INHALATION_SPRAY | Freq: Four times a day (QID) | RESPIRATORY_TRACT | Status: DC | PRN
Start: 2013-01-18 — End: 2013-01-19

## 2013-01-18 MED ORDER — LINAGLIPTIN 5 MG PO TABS
5.0000 mg | ORAL_TABLET | Freq: Every day | ORAL | Status: DC
  Filled 2013-01-18: qty 1

## 2013-01-18 MED ORDER — SODIUM CHLORIDE 0.9 % IJ SOLN
3.0000 mL | Freq: Two times a day (BID) | INTRAMUSCULAR | Status: DC
  Administered 2013-01-18: 3 mL via INTRAVENOUS

## 2013-01-18 MED ORDER — MIDAZOLAM HCL 5 MG/ML IJ SOLN
INTRAMUSCULAR | Status: AC
  Filled 2013-01-18: qty 2

## 2013-01-18 MED ORDER — ATENOLOL 50 MG PO TABS
50.0000 mg | ORAL_TABLET | Freq: Every day | ORAL | Status: DC
  Administered 2013-01-18 – 2013-01-19 (×2): 50 mg via ORAL
  Filled 2013-01-18 (×2): qty 1

## 2013-01-18 MED ORDER — GLIMEPIRIDE 4 MG PO TABS
4.0000 mg | ORAL_TABLET | Freq: Every day | ORAL | Status: DC
  Administered 2013-01-18: 4 mg via ORAL
  Filled 2013-01-18 (×2): qty 1

## 2013-01-18 NOTE — Consult Note (Signed)
[facility] Consultation  Referring Provider:   [facility]   Primary Care Physician:  Michele Mcalpine, MD Primary Gastroenterologist:     [name], MD    Reason for Consultation :  GI bleed            HPI:     Patient is a 72 year old female with multiple medical problems, on multiple medications.  She is well known to Dr. Juanda Chance for history of cirrhosis complicated by portal HTN and GI bleeding from GAVE. Patient admitted this am with shortness of breath, anemia, and painless GI bleeding since yesterday. She has been seeing streaks of bright red blood for a month but yesterday began having significant bleeding. Reports black loose stool in addition to the bright red blood. She has had some nausea and vomiting of non-bloody emesis. She also reports nosebleeds.  Past Medical History  Diagnosis Date  . Chronic airway obstruction, not elsewhere classified   . Unspecified chronic bronchitis   . Unspecified essential hypertension   . Coronary atherosclerosis of unspecified type of vessel, native or graft   . Palpitations   . Aortic valve disorders   . Pure hypercholesterolemia   . Morbid obesity   . Esophageal reflux   . Angiodysplasia of intestine (without mention of hemorrhage)   . Diverticulosis of colon (without mention of hemorrhage)   . Benign neoplasm of colon   . Autoimmune hepatitis   . Cystitis, unspecified   . Osteoarthrosis, unspecified whether generalized or localized, unspecified site   . Osteoporosis, unspecified   . Restless legs syndrome (RLS)   . Major depressive disorder, recurrent episode, severe, specified as with psychotic behavior   . Iron deficiency anemia secondary to blood loss (chronic)   . Coarse tremors     "just in my arms"  . Carpal tunnel syndrome on both sides   . Dysrhythmia     palpitations  . CHF (congestive heart failure)   . Asthma   . Shortness of breath     "any time really"  . Sleep apnea     "but I don't use the  equipment"  . Type II diabetes mellitus   . Blood transfusion   . H/O hiatal hernia   . 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 Adenomatous colon polyp     Past Surgical History  Procedure Laterality Date  . Hemorroidectomy    . Appendectomy    . Vaginal hysterectomy    . Breast biopsy      bilateral  . Cesarean section      x 3  . Appendectomy    . Esophagogastroduodenoscopy  06/12/2012    Procedure: ESOPHAGOGASTRODUODENOSCOPY (EGD);  Surgeon: Hart Carwin, MD;  Location: [facility];  Service: Endoscopy;  Laterality: N/A;  . Balloon dilation  06/12/2012    Procedure: BALLOON DILATION;  Surgeon: Hart Carwin, MD;  Location: [facility];  Service: Endoscopy;  Laterality: N/A;  ?balloon  . Esophagogastroduodenoscopy  09/10/2012    Procedure: ESOPHAGOGASTRODUODENOSCOPY (EGD);  Surgeon: Hart Carwin, MD;  Location: [facility];  Service: Endoscopy;  Laterality: N/A;  . Hot hemostasis  09/10/2012    Procedure: HOT HEMOSTASIS (ARGON PLASMA COAGULATION/BICAP);  Surgeon: Hart Carwin, MD;  Location: [facility];  Service: Endoscopy;  Laterality: N/A;    Family History  Problem Relation Age of Onset  . Heart disease Mother   . Breast cancer Sister   . Cervical cancer Mother   . Diabetes Father   .  Colon cancer Neg Hx   . Breast cancer Maternal Aunt     x 2  . Diabetes Father   . Diabetes Sister   . Kidney disease Mother      History  Substance Use Topics  . Smoking status: Former Smoker -- 1.50 packs/day for 25 years    Types: Cigarettes    Quit date: 08/26/1992  . Smokeless tobacco: Never Used  . Alcohol Use: Yes     Comment: 11/22/11 "last alcoholic drink was years ago"    Prior to Admission medications   Medication Sig Start Date End Date Taking? Authorizing Provider  albuterol (PROAIR HFA) 108 (90 BASE) MCG/ACT inhaler Inhale 2 puffs into the lungs every 6 (six) hours as needed for shortness of breath.    Yes Historical Provider, MD  ALPRAZolam Prudy Feeler) 1 MG  tablet Take 1 mg by mouth at bedtime.    Yes Historical Provider, MD  Armodafinil (NUVIGIL) 150 MG tablet Take 150 mg by mouth daily.   Yes Historical Provider, MD  atenolol (TENORMIN) 50 MG tablet Take 50 mg by mouth daily.   Yes Historical Provider, MD  b complex vitamins tablet Take 1 tablet by mouth daily.    Yes Historical Provider, MD  buPROPion (WELLBUTRIN XL) 150 MG 24 hr tablet Take 150 mg by mouth daily.   Yes Historical Provider, MD  carbidopa-levodopa (SINEMET CR) 25-100 MG per tablet Take 1 tablet by mouth 2 (two) times daily. Take with lunch and dinner   Yes Historical Provider, MD  desvenlafaxine (PRISTIQ) 100 MG 24 hr tablet Take 100 mg by mouth daily.   Yes Historical Provider, MD  dextromethorphan-guaiFENesin (MUCINEX DM) 30-600 MG per 12 hr tablet Take 1-2 tablets by mouth every 12 (twelve) hours.    Yes Historical Provider, MD  Fluticasone-Salmeterol (ADVAIR DISKUS) 250-50 MCG/DOSE AEPB Inhale 1 puff into the lungs 2 (two) times daily. 10/28/12  Yes Michele Mcalpine, MD  furosemide (LASIX) 40 MG tablet Take 1 tablet (40 mg total) by mouth daily. 10/04/12  Yes Michele Mcalpine, MD  glimepiride (AMARYL) 4 MG tablet Take 4 mg by mouth daily before breakfast.   Yes Historical Provider, MD  insulin glargine (LANTUS) 100 UNIT/ML injection Inject 30 Units into the skin daily.   Yes Historical Provider, MD  metFORMIN (GLUCOPHAGE) 500 MG tablet Take 500 mg by mouth 2 (two) times daily with a meal.   Yes Historical Provider, MD  Multiple Vitamins-Minerals (CENTRUM SILVER PO) Take 1 tablet by mouth daily.  11/22/11  Yes Historical Provider, MD  pantoprazole (PROTONIX) 40 MG tablet Take 40 mg by mouth daily.   Yes Historical Provider, MD  polyethylene glycol powder (GLYCOLAX/MIRALAX) powder Take 17 g by mouth daily as needed (for constipation).  06/05/12  Yes Hart Carwin, MD  potassium chloride SA (K-DUR,KLOR-CON) 20 MEQ tablet Take 20 mEq by mouth daily. 11/23/11  Yes Historical Provider, MD   promethazine (PHENERGAN) 25 MG tablet Take 1 tablet (25 mg total) by mouth every 6 (six) hours as needed. 12/30/12  Yes Michele Mcalpine, MD  QUEtiapine (SEROQUEL) 100 MG tablet Take 400 mg by mouth at bedtime.  11/22/11  Yes Historical Provider, MD  rOPINIRole (REQUIP) 4 MG tablet Take 4 mg by mouth at bedtime.   Yes Historical Provider, MD  Simethicone (GAS-X PO) Take 1 tablet by mouth daily. Takes after the biggest meal of the day   Yes Historical Provider, MD  simvastatin (ZOCOR) 40 MG tablet Take 40 mg by  mouth at bedtime.   Yes Historical Provider, MD  sitaGLIPtin (JANUVIA) 100 MG tablet Take 100 mg by mouth daily.   Yes Historical Provider, MD  Vitamin D, Ergocalciferol, (DRISDOL) 50000 UNITS CAPS Take 1 capsule (50,000 Units total) by mouth every 7 (seven) days. Take on friday 02/24/12  Yes Michele Mcalpine, MD  B-D ULTRAFINE III SHORT PEN 31G X 8 MM MISC USE AS DIRECTED 12/17/11   Michele Mcalpine, MD    Current Facility-Administered Medications  Medication Dose Route Frequency Provider Last Rate Last Dose  . 0.9 %  sodium chloride infusion   Intravenous Continuous Ward Givens, MD      . 0.9 %  sodium chloride infusion  250 mL Intravenous PRN Houston Siren, MD      . albuterol (PROVENTIL HFA;VENTOLIN HFA) 108 (90 BASE) MCG/ACT inhaler 2 puff  2 puff Inhalation Q6H PRN Houston Siren, MD      . ALPRAZolam Prudy Feeler) tablet 1 mg  1 mg Oral QHS Houston Siren, MD      . atenolol (TENORMIN) tablet 50 mg  50 mg Oral Daily Houston Siren, MD      . B-complex with vitamin C tablet 1 tablet  1 tablet Oral Daily Houston Siren, MD      . buPROPion (WELLBUTRIN XL) 24 hr tablet 150 mg  150 mg Oral Daily Houston Siren, MD      . carbidopa-levodopa (SINEMET CR) 25-100 MG per tablet controlled release 1 tablet  1 tablet Oral Custom Houston Siren, MD      . dextromethorphan-guaiFENesin [facility] DM) 30-600 MG per 12 hr tablet 1-2 tablet  1-2 tablet Oral Q12H Houston Siren, MD   2 tablet at 01/18/13 [phone]  . [START ON 01/21/2013] furosemide (LASIX) injection 20 mg   20 mg Intravenous Once Hart Carwin, MD      . insulin aspart (novoLOG) injection 0-5 Units  0-5 Units Subcutaneous QHS Renae Fickle, MD      . insulin aspart (novoLOG) injection 0-9 Units  0-9 Units Subcutaneous TID WC Renae Fickle, MD      . modafinil (PROVIGIL) tablet 200 mg  200 mg Oral Daily Renae Fickle, MD      . mometasone-formoterol (DULERA) 100-5 MCG/ACT inhaler 2 puff  2 puff Inhalation BID Houston Siren, MD      . ondansetron [facility]) tablet 4 mg  4 mg Oral Q6H PRN Houston Siren, MD       Or  . ondansetron [facility]) injection 4 mg  4 mg Intravenous Q6H PRN Houston Siren, MD      . oxymetazoline (AFRIN) 0.05 % nasal spray 2 spray  2 spray Each Nare Q6H PRN Renae Fickle, MD      . pantoprazole (PROTONIX) EC tablet 40 mg  40 mg Oral Daily Houston Siren, MD      . potassium chloride SA (K-DUR,KLOR-CON) CR tablet 20 mEq  20 mEq Oral Daily Houston Siren, MD      . promethazine (PHENERGAN) tablet 25 mg  25 mg Oral Q6H PRN Renae Fickle, MD      . QUEtiapine (SEROQUEL) tablet 400 mg  400 mg Oral QHS Houston Siren, MD      . rOPINIRole (REQUIP) tablet 4 mg  4 mg Oral QHS Houston Siren, MD      . simvastatin (ZOCOR) tablet 40 mg  40 mg Oral QHS Houston Siren, MD      . sodium chloride (OCEAN) 0.65 % nasal spray 1 spray  1 spray Each  Nare PRN Renae Fickle, MD      . sodium chloride 0.9 % injection 3 mL  3 mL Intravenous Q12H Houston Siren, MD      . sodium chloride 0.9 % injection 3 mL  3 mL Intravenous Q12H Houston Siren, MD      . sodium chloride 0.9 % injection 3 mL  3 mL Intravenous PRN Houston Siren, MD      . venlafaxine XR (EFFEXOR-XR) 24 hr capsule 150 mg  150 mg Oral Q breakfast Houston Siren, MD   150 mg at 01/18/13 0865  . [START ON 01/22/2013] Vitamin D (Ergocalciferol) (DRISDOL) capsule 50,000 Units  50,000 Units Oral Q Fri Houston Siren, MD        Allergies as of 01/17/2013 - Review Complete 01/14/2013  Allergen Reaction Noted  . Aspirin Other (See Comments) 12/11/2010  . Penicillins Itching   . Theophylline Nausea And  Vomiting     Review of Systems:    All systems reviewed and negative except where noted in HPI.    Physical Exam:  Vital signs in last 24 hours: Temp:  [98 F (36.7 C)-100.3 F (37.9 C)] 98 F (36.7 C) (05/26 0854) Pulse Rate:  [65-76] 68 (05/26 0854) Resp:  [13-20] 18 (05/26 0854) BP: (112-166)/(31-76) 145/75 mmHg (05/26 0854) SpO2:  [97 %-100 %] 100 % (05/26 0854) Weight:  [218 lb 14.7 oz (99.3 kg)] 218 lb 14.7 oz (99.3 kg) (05/26 0344)   General:   Pleasant, morbidly obese female in NAD Head:  Normocephalic and atraumatic. Eyes:   No icterus.   Conjunctiva pink. Ears:  Normal auditory acuity. Neck:  Supple; no masses felt Lungs:  Respirations even and unlabored. Lungs clear to auscultation bilaterally.   No wheezes, crackles, or rhonchi.  Heart:  Regular rate and rhythm. Abdomen:  Soft, obese, nontender. Normal bowel sounds. No appreciable massesy.  Rectal:  No external lesions, no stool or blood in vault  Msk:  Symmetrical without gross deformities.  Extremities:  Without edema. Neurologic:  Alert and  oriented x4;  grossly normal neurologically. Skin:  Intact without significant lesions or rashes. Cervical Nodes:  No significant cervical adenopathy. Psych:  Alert and cooperative. Normal affect.  LAB RESULTS:  Recent Labs  01/15/13 1012 01/18/13 0015  WBC 4.2* 3.2*  HGB 7.7* 7.0*  HCT 22.8* 21.9*  PLT 94.0* 86*   BMET  Recent Labs  01/18/13 0015  NA 135  K 3.9  CL 98  CO2 23  GLUCOSE 299*  BUN 18  CREATININE 0.88  CALCIUM 9.4   LFT  Recent Labs  01/18/13 0015  PROT 5.9*  ALBUMIN 2.8*  AST 34  ALT 30  ALKPHOS 111  BILITOT 0.5     Impression / Plan:    1. 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  No varices on last EGD January 2014. Cirrhosis appears compensated at present. Will check INR.  2. Painless GI bleeding with black stools/ bright red blood. Source not clear. BUN normal, she is hemodynamically  stable but still could be upper bleed. She has a history of GAVE. Rule out recurrent gastric AVMs (s/p ablation on January 2014 EGD).  If lower bleed not sure why she reports black stools (not on iron or bismuth) but obviously diverticular hemorrhage possible. Ischemia less likely. Will proceed with EGD this am for further evaluation. Continue the current PPI dose of once daily until EGD this am. She isn't actively bleeding on exam.   3. Acute on  chronic anemia secondary to #1. Patient getting 1st unit of blood now, a second has been ordered.  4. Nosebleeds. Check INR  5. History of adenomatous polyps of the colon. Her last colonoscopy was in 2008. She is at high risk for a colonoscopy.    Thanks   LOS: 1 day   Willette Cluster  01/18/2013, 9:03 AM  Attending MD note:   I have reviewed the above note, examined the patient and agree with plan of treatment.Her blood count has never bounced back after the January 2014 EGD with laser ablation of avm's. GIB  Is one cause of her anemia but Isuspect chronic disease to be a contributing factor. Thrombocytopenis is due to portal hypertension. I suspect avm's not only in the stomach but also in small bowl. Will EGD today.  [facility] Pager # [phone]

## 2013-01-18 NOTE — Progress Notes (Signed)
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 -  Hold oral anti-hyperglycemics -  Sliding-scale insulin  Diet:  Carbohydrate modified Access:  PIV IVF:  Normal saline at 50 mL per hour Proph:  SCDs  Code Status: Full Family Communication: spoke with patient alone Disposition Plan: pending hgb stable, GI consultation   Consultants:  GI  Procedures:  None  Antibiotics:  None   HPI/Subjective:  States she had an episode of epistaxis overnight which stopped spontaneously.  She also had some nausea prior to admission with vomiting of clear fluid/water without coffee grounds or red tinge or bile.  She has not had any further BMs since admission.  She continues to feel SOB without wheezing and has a stable cough.    Objective: Filed Vitals:   01/18/13 0344 01/18/13 0530 01/18/13 0545 01/18/13 0642  BP: 166/50 122/45 112/45 137/54  Pulse: 75 67 67 72  Temp: 100.3 F (37.9 C) 98.5 F (36.9 C) 98.4 F (36.9 C) 98.8 F (37.1 C)  TempSrc: Oral Oral    Resp: 17 18 20 18   Weight: 99.3 kg (218 lb 14.7 oz)     SpO2: 100%        Intake/Output Summary (Last 24 hours) at 01/18/13 0735 Last data filed at 01/18/13 0530  Gross per 24 hour  Intake    600 ml  Output      0 ml  Net    600 ml   Filed Weights   01/18/13 0344  Weight: 99.3 kg (218 lb 14.7 oz)    Exam:   General:  Obese CF, No acute distress, pleasant, mild pallor  HEENT:  NCAT, MMM, nasal canula in place  Cardiovascular:  RRR, nl S1, S2, 3/6 systolic murmur at the RUSB, no rg, 2+ pulses, warm extremities  Respiratory:  CTAB, no increased WOB  Abdomen:  NABS, soft, NT/ND  MSK:   Normal tone and bulk, no LEE  Neuro:  Grossly intact  Data Reviewed: Basic Metabolic Panel:  Recent Labs Lab 01/18/13 0015  NA 135  K 3.9  CL 98  CO2 23  GLUCOSE 299*  BUN 18  CREATININE 0.88  CALCIUM 9.4   Liver Function Tests:  Recent Labs Lab 01/18/13 0015  AST 34  ALT 30  ALKPHOS 111  BILITOT 0.5  PROT 5.9*  ALBUMIN 2.8*   No results found for this basename: LIPASE, AMYLASE,  in the last 168 hours No results found for this basename: AMMONIA,  in the last 168 hours CBC:  Recent Labs Lab 01/15/13 1012 01/18/13 0015  WBC 4.2* 3.2*  NEUTROABS 3.0  --   HGB 7.7* 7.0*  HCT 22.8* 21.9*  MCV 95.1  96.5  PLT 94.0* 86*   Cardiac Enzymes: No results found for this basename: CKTOTAL, CKMB, CKMBINDEX, TROPONINI,  in the last 168 hours BNP (last 3 results)  Recent Labs  10/15/12 1349 11/12/12 1107 01/18/13 0027  PROBNP 164.0* 180.0* 167.9*   CBG:  Recent Labs Lab 01/18/13 0554  GLUCAP 271*    No results found for this or any previous visit (from the past 240 hour(s)).   Studies: No results found.  Scheduled Meds: . ALPRAZolam  1 mg Oral QHS  . atenolol  50 mg Oral Daily  . B-complex with vitamin C  1 tablet Oral Daily  . buPROPion  150 mg Oral Daily  . carbidopa-levodopa  1 tablet Oral Custom  . dextromethorphan-guaiFENesin  1-2 tablet Oral Q12H  . [START ON 01/21/2013] furosemide  20 mg Intravenous Once  .  glimepiride  4 mg Oral QAC breakfast  . linagliptin  5 mg Oral Daily  . metFORMIN  500 mg Oral BID WC  . modafinil  200 mg Oral Daily  . mometasone-formoterol  2 puff Inhalation BID  . pantoprazole  40 mg Oral Daily  . potassium chloride SA  20 mEq Oral Daily  . QUEtiapine  400 mg Oral QHS  . rOPINIRole  4 mg Oral QHS  . simvastatin  40 mg Oral QHS  . sodium chloride  3 mL Intravenous Q12H  . sodium chloride  3 mL Intravenous Q12H  . venlafaxine XR  150 mg Oral Q breakfast  . [START ON 01/22/2013] Vitamin D (Ergocalciferol)  50,000 Units Oral Q Fri   Continuous Infusions: . 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 If 7PM-7AM, please contact night-coverage at [email or web address], password [facility] 01/18/2013, 7:35 AM  LOS: 1 day

## 2013-01-18 NOTE — Progress Notes (Signed)
Utilization review completed. Rochanda Harpham, RN, BSN. 

## 2013-01-18 NOTE — Progress Notes (Signed)
2nd blood transfusion complete. No reaction noted. Patient denies any distress. Vss. Call bell near. [name]

## 2013-01-18 NOTE — Interval H&P Note (Signed)
History and Physical Interval Note:  01/18/2013 1:45 PM  [name]  has presented today for surgery, with the diagnosis of gi bleed  The various methods of treatment have been discussed with the patient and family. After consideration of risks, benefits and other options for treatment, the patient has consented to  Procedure(s): ESOPHAGOGASTRODUODENOSCOPY (EGD) (N/A) as a surgical intervention .  The patient's history has been reviewed, patient examined, no change in status, stable for surgery.  I have reviewed the patient's chart and labs.  Questions were answered to the patient's satisfaction.     [name]

## 2013-01-18 NOTE — ED Provider Notes (Signed)
History     CSN: [id number]  Arrival date & time 01/17/13  2308   First MD Initiated Contact with Patient 01/17/13 2356      Chief Complaint  Patient presents with  . GI Bleeding    (Consider location/radiation/quality/duration/timing/severity/associated sxs/prior treatment) HPI  Patient reports about 10:15 this evening she started having rectal bleeding and reports she had blood mixed with stool about 3 times. She denies any abdominal pain except right before she has to pass the blood. She denies nausea but states she has been spitting up "hot water" about 30 times a day. She states this has been going on for a few weeks and every time she belches hot-water comes up. She reports if she eats any kind of ice or water it happens. She reports she feels weak and dizzy. She reports she has been having shortness of breath and she has had to increase her oxygen to 4 L a minute. She also now has to wear her oxygen 24/ 7. She denies chest pain or swelling in her extremities. She reports she has been having chronic anemia and she is been getting iron infusions which aren't helping. She is scheduled to have a transfusion done in 4 days. She also reports she has had a stable heart murmur for several years.  PCP Dr Kriste Basque GI Dr Juanda Chance Neuro Dr Dohmeier Urology Dr Isabel Caprice Cardiology Dr Excell Seltzer  Past Medical History  Diagnosis Date  . Chronic airway obstruction, not elsewhere classified   . Unspecified chronic bronchitis   . Unspecified essential hypertension   . Coronary atherosclerosis of unspecified type of vessel, native or graft   . Palpitations   . Aortic valve disorders   . Pure hypercholesterolemia   . Morbid obesity   . Esophageal reflux   . Angiodysplasia of intestine (without mention of hemorrhage)   . Diverticulosis of colon (without mention of hemorrhage)   . Benign neoplasm of colon   . Autoimmune hepatitis   . Cystitis, unspecified   . Osteoarthrosis, unspecified whether  generalized or localized, unspecified site   . Osteoporosis, unspecified   . Restless legs syndrome (RLS)   . Major depressive disorder, recurrent episode, severe, specified as with psychotic behavior   . Iron deficiency anemia secondary to blood loss (chronic)   . Coarse tremors     "just in my arms"  . Carpal tunnel syndrome on both sides   . Dysrhythmia     palpitations  . CHF (congestive heart failure)   . Asthma   . Shortness of breath     "any time really"  . Sleep apnea     "but I don't use the equipment"  . Type II diabetes mellitus   . Blood transfusion   . H/O hiatal hernia   . 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 Adenomatous colon polyp     Past Surgical History  Procedure Laterality Date  . Hemorroidectomy    . Appendectomy    . Vaginal hysterectomy    . Breast biopsy      bilateral  . Cesarean section      x 3  . Appendectomy    . Esophagogastroduodenoscopy  06/12/2012    Procedure: ESOPHAGOGASTRODUODENOSCOPY (EGD);  Surgeon: Hart Carwin, MD;  Location: [facility];  Service: Endoscopy;  Laterality: N/A;  . Balloon dilation  06/12/2012    Procedure: BALLOON DILATION;  Surgeon: Hart Carwin, MD;  Location: [facility];  Service: Endoscopy;  Laterality: N/A;  ?  balloon  . Esophagogastroduodenoscopy  09/10/2012    Procedure: ESOPHAGOGASTRODUODENOSCOPY (EGD);  Surgeon: Hart Carwin, MD;  Location: [facility];  Service: Endoscopy;  Laterality: N/A;  . Hot hemostasis  09/10/2012    Procedure: HOT HEMOSTASIS (ARGON PLASMA COAGULATION/BICAP);  Surgeon: Hart Carwin, MD;  Location: [facility];  Service: Endoscopy;  Laterality: N/A;    Family History  Problem Relation Age of Onset  . Heart disease Mother   . Breast cancer Sister   . Cervical cancer Mother   . Diabetes Father   . Colon cancer Neg Hx   . Breast cancer Maternal Aunt     x 2  . Diabetes Father   . Diabetes Sister   . Kidney disease Mother     History  Substance Use Topics  . Smoking  status: Former Smoker -- 1.50 packs/day for 25 years    Types: Cigarettes    Quit date: 08/26/1992  . Smokeless tobacco: Never Used  . Alcohol Use: Yes     Comment: 11/22/11 "last alcoholic drink was years ago"  lives at home Lives with spouse Home oxygen 4 lpmNC  OB History   Grav Para Term Preterm Abortions TAB SAB Ect Mult Living                  Review of Systems  All other systems reviewed and are negative.    Allergies  Aspirin; Penicillins; and Theophylline  Home Medications   Current Outpatient Rx  Name  Route  Sig  Dispense  Refill  . albuterol (PROAIR HFA) 108 (90 BASE) MCG/ACT inhaler   Inhalation   Inhale 2 puffs into the lungs every 6 (six) hours as needed for shortness of breath.          . ALPRAZolam (XANAX) 1 MG tablet   Oral   Take 1 mg by mouth at bedtime.          . Armodafinil (NUVIGIL) 150 MG tablet   Oral   Take 150 mg by mouth daily.         [name] atenolol (TENORMIN) 50 MG tablet   Oral   Take 50 mg by mouth daily.         [name] b complex vitamins tablet   Oral   Take 1 tablet by mouth daily.          [name] buPROPion (WELLBUTRIN XL) 150 MG 24 hr tablet   Oral   Take 150 mg by mouth daily.         . carbidopa-levodopa (SINEMET CR) 25-100 MG per tablet   Oral   Take 1 tablet by mouth 2 (two) times daily. Take with lunch and dinner         . desvenlafaxine (PRISTIQ) 100 MG 24 hr tablet   Oral   Take 100 mg by mouth daily.         [name] dextromethorphan-guaiFENesin (MUCINEX DM) 30-600 MG per 12 hr tablet   Oral   Take 1-2 tablets by mouth every 12 (twelve) hours.          . Fluticasone-Salmeterol (ADVAIR DISKUS) 250-50 MCG/DOSE AEPB   Inhalation   Inhale 1 puff into the lungs 2 (two) times daily.   1 each   0   . furosemide (LASIX) 40 MG tablet   Oral   Take 1 tablet (40 mg total) by mouth daily.   30 tablet   6   . glimepiride (AMARYL) 4 MG tablet   Oral  Take 4 mg by mouth daily before breakfast.         .  insulin glargine (LANTUS) 100 UNIT/ML injection   Subcutaneous   Inject 30 Units into the skin daily.         . metFORMIN (GLUCOPHAGE) 500 MG tablet   Oral   Take 500 mg by mouth 2 (two) times daily with a meal.         . Multiple Vitamins-Minerals (CENTRUM SILVER PO)   Oral   Take 1 tablet by mouth daily.          . pantoprazole (PROTONIX) 40 MG tablet   Oral   Take 40 mg by mouth daily.         . polyethylene glycol powder (GLYCOLAX/MIRALAX) powder   Oral   Take 17 g by mouth daily as needed (for constipation).          . potassium chloride SA (K-DUR,KLOR-CON) 20 MEQ tablet   Oral   Take 20 mEq by mouth daily.         . promethazine (PHENERGAN) 25 MG tablet   Oral   Take 1 tablet (25 mg total) by mouth every 6 (six) hours as needed.   30 tablet   6   . QUEtiapine (SEROQUEL) 100 MG tablet   Oral   Take 400 mg by mouth at bedtime.          [name] rOPINIRole (REQUIP) 4 MG tablet   Oral   Take 4 mg by mouth at bedtime.         . Simethicone (GAS-X PO)   Oral   Take 1 tablet by mouth daily. Takes after the biggest meal of the day         . simvastatin (ZOCOR) 40 MG tablet   Oral   Take 40 mg by mouth at bedtime.         . sitaGLIPtin (JANUVIA) 100 MG tablet   Oral   Take 100 mg by mouth daily.         . Vitamin D, Ergocalciferol, (DRISDOL) 50000 UNITS CAPS   Oral   Take 1 capsule (50,000 Units total) by mouth every 7 (seven) days. Take on friday   5 capsule   11   . B-D ULTRAFINE III SHORT PEN 31G X 8 MM MISC      USE AS DIRECTED   100 each   6     BP 119/33  Pulse 76  SpO2 98%  Vital signs normal    Physical Exam  Nursing note and vitals reviewed. Constitutional: She is oriented to person, place, and time. She appears well-developed and well-nourished.  Non-toxic appearance. She does not appear ill. No distress.  HENT:  Head: Normocephalic and atraumatic.  Right Ear: External ear normal.  Left Ear: External ear normal.   Nose: Nose normal. No mucosal edema or rhinorrhea.  Mouth/Throat: Oropharynx is clear and moist and mucous membranes are normal. No dental abscesses or edematous.  Eyes: EOM are normal. Pupils are equal, round, and reactive to light.  Pale conjunctiva  Neck: Normal range of motion and full passive range of motion without pain. Neck supple.  Cardiovascular: Normal rate and regular rhythm.  Exam reveals no gallop and no friction rub.   Murmur heard.  Systolic murmur is present  Pulmonary/Chest: Effort normal and breath sounds normal. No respiratory distress. She has no wheezes. She has no rhonchi. She has no rales. She exhibits no tenderness and no crepitus.  Abdominal: Soft. Normal appearance and bowel sounds are normal. She exhibits no distension. There is no tenderness. There is no rebound and no guarding.  Genitourinary:  Old hemorrhoid tags, no stool in the vault, small specks of maroon colored specks on glove  Musculoskeletal: Normal range of motion. She exhibits no edema and no tenderness.  Moves all extremities well.   Neurological: She is alert and oriented to person, place, and time. She has normal strength. No cranial nerve deficit.  Skin: Skin is warm, dry and intact. No rash noted. No erythema. There is pallor.  Patient extremely pale  Psychiatric: She has a normal mood and affect. Her speech is normal and behavior is normal. Her mood appears not anxious.    ED Course  Procedures (including critical care time)  Pt prepared to get PRC's in ED.  01:47 Dr Conley Rolls, admit to tele, team 10  Results for orders placed during the hospital encounter of 01/17/13  CBC      Result Value Range   WBC 3.2 (*) 4.0 - 10.5 K/uL   RBC 2.27 (*) 3.87 - 5.11 MIL/uL   Hemoglobin 7.0 (*) 12.0 - 15.0 g/dL   HCT 16.1 (*) 09.6 - 04.5 %   MCV 96.5  78.0 - 100.0 fL   MCH 30.8  26.0 - 34.0 pg   MCHC 32.0  30.0 - 36.0 g/dL   RDW 40.9 (*) 81.1 - 91.4 %   Platelets 86 (*) 150 - 400 K/uL  COMPREHENSIVE  METABOLIC PANEL      Result Value Range   Sodium 135  135 - 145 mEq/L   Potassium 3.9  3.5 - 5.1 mEq/L   Chloride 98  96 - 112 mEq/L   CO2 23  19 - 32 mEq/L   Glucose, Bld 299 (*) 70 - 99 mg/dL   BUN 18  6 - 23 mg/dL   Creatinine, Ser 7.82  0.50 - 1.10 mg/dL   Calcium 9.4  8.4 - 95.6 mg/dL   Total Protein 5.9 (*) 6.0 - 8.3 g/dL   Albumin 2.8 (*) 3.5 - 5.2 g/dL   AST 34  0 - 37 U/L   ALT 30  0 - 35 U/L   Alkaline Phosphatase 111  39 - 117 U/L   Total Bilirubin 0.5  0.3 - 1.2 mg/dL   GFR calc non Af Amer 66 (*) >90 mL/min   GFR calc Af Amer 76 (*) >90 mL/min  PRO B NATRIURETIC PEPTIDE      Result Value Range   Pro B Natriuretic peptide (BNP) 167.9 (*) 0 - 125 pg/mL  OCCULT BLOOD, POC DEVICE      Result Value Range   Fecal Occult Bld POSITIVE (*) NEGATIVE  TYPE AND SCREEN      Result Value Range   ABO/RH(D) O POS     Antibody Screen PENDING     Sample Expiration 01/21/2013    PREPARE RBC (CROSSMATCH)      Result Value Range   Order Confirmation ORDER PROCESSED BY BLOOD BANK     Laboratory interpretation all normal except +hemoccult,anemia, hyperglylcemia    1. Bright red rectal bleeding   2. Anemia   3. Dyspnea on exertion     Plan admission   Devoria Albe, MD, FACEP    MDM          Ward Givens, MD 01/18/13 [phone]

## 2013-01-18 NOTE — Op Note (Signed)
[facility] [address] [place], [address]   ENDOSCOPY PROCEDURE REPORT  PATIENT: Denni, [place]  MR#: [id number] BIRTHDATE: [birth year] , 71  yrs. old GENDER: Female ENDOSCOPIST: Hart Carwin, MD REFERRED BY:  Alroy Dust, M.D. PROCEDURE DATE:  01/18/2013 PROCEDURE:  EGD w/ thermal GERD treatment ASA CLASS:     Class III INDICATIONS:  anemia, black stools, hx of GAVE, normal BUN- post ERBE ablation of avm's in [id number] and many times before, chronic anemia, hx cirrhosis, portal hypertension. MEDICATIONS: These medications were titrated to patient response per physician's verbal order, Versed 5 mg IV, and Fentanyl 75 mcg IV TOPICAL ANESTHETIC: Cetacaine Spray  DESCRIPTION OF PROCEDURE: After the risks benefits and alternatives of the procedure were thoroughly explained, informed consent was obtained.  The Pentax Gastroscope [id number] endoscope was introduced through the mouth and advanced to the second portion of the duodenum. Without limitations.  The instrument was slowly withdrawn as the mucosa was fully examined.      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  COMPLICATIONS: There were no complications. ENDOSCOPIC IMPRESSION:  1. small esophageal varices , not bleeding 2. portal gastropathy cardia and antrum (GAVE), no active bleeding, 3. s/p Laser ablation of multiple avm's in the antrum RECOMMENDATIONS:  follow H/H consider RBC nuclear medicine scan if bleeding  accelerates Will do SBCE when the acuute episode subsides to look for more extensive avm's in the small bowl trial of a beta blocker to lower portal pressure( she runs low blood pressure and may not be able to tolerate it) PPI consider colonoscopy although too high risk at this point  ( last exam 2008)   REPEAT EXAM: may repeat ablation in the future  eSigned:  Hart Carwin, MD 01/18/2013 2:30 PM   CC:  PATIENT NAME:  John, Vasconcelos MR#: [id number]

## 2013-01-18 NOTE — H&P (Signed)
[facility] History and Physical  [name] ZOX:[id number] DOB: [birth year]    PCP:   Michele Mcalpine, MD   Chief Complaint: Increase shortness of breath.  HPI: [name] is an 72 y.o. female with hx of AS, angiodysplasia of the colon, intermittent rectal bleed since at least Jan 2014, iron deficiency anemia requiring IV iron infusion, morbid obesity, hyperlipidemia, presents to the ER with increase weakness, increase shortness of breath and more bleeding.  Evaluation in the ER showed Hb of 7.7 g/DL, BS of 811, BUN 18.  Rectal exam done in the ER showed small amount of bright red blood.  Hospitalist was asked to admit her for slow lower rectal bleeding and anemia.  She has seen Dr Juanda Chance in the past, and was expected to receive blood transfusion next week.  She maintained normal hemodynamics.  Rewiew of Systems:  Constitutional: Negative for malaise, fever and chills. No significant weight loss or weight gain Eyes: Negative for eye pain, redness and discharge, diplopia, visual changes, or flashes of light. ENMT: Negative for ear pain, hoarseness, nasal congestion, sinus pressure and sore throat. No headaches; tinnitus, drooling, or problem swallowing. Cardiovascular: Negative for chest pain, palpitations, diaphoresis, dyspnea and peripheral edema. ; No orthopnea, PND Respiratory: Negative for cough, hemoptysis, wheezing and stridor. No pleuritic chestpain. Gastrointestinal: Negative for nausea, vomiting, diarrhea, constipation, abdominal pain, melena,  hematemesis, jaundice and rectal bleeding.    Genitourinary: Negative for frequency, dysuria, incontinence,flank pain and hematuria; Musculoskeletal: Negative for back pain and neck pain. Negative for swelling and trauma.;  Skin: . Negative for pruritus, rash, abrasions, bruising and skin lesion.; ulcerations Neuro: Negative for headache, lightheadedness and neck stiffness. Negative for weakness, altered level of consciousness ,  altered mental status, extremity weakness, burning feet, involuntary movement, seizure and syncope.  Psych: negative for anxiety, depression, insomnia, tearfulness, panic attacks, hallucinations, paranoia, suicidal or homicidal ideation    Past Medical History  Diagnosis Date  . Chronic airway obstruction, not elsewhere classified   . Unspecified chronic bronchitis   . Unspecified essential hypertension   . Coronary atherosclerosis of unspecified type of vessel, native or graft   . Palpitations   . Aortic valve disorders   . Pure hypercholesterolemia   . Morbid obesity   . Esophageal reflux   . Angiodysplasia of intestine (without mention of hemorrhage)   . Diverticulosis of colon (without mention of hemorrhage)   . Benign neoplasm of colon   . Autoimmune hepatitis   . Cystitis, unspecified   . Osteoarthrosis, unspecified whether generalized or localized, unspecified site   . Osteoporosis, unspecified   . Restless legs syndrome (RLS)   . Major depressive disorder, recurrent episode, severe, specified as with psychotic behavior   . Iron deficiency anemia secondary to blood loss (chronic)   . Coarse tremors     "just in my arms"  . Carpal tunnel syndrome on both sides   . Dysrhythmia     palpitations  . CHF (congestive heart failure)   . Asthma   . Shortness of breath     "any time really"  . Sleep apnea     "but I don't use the equipment"  . Type II diabetes mellitus   . Blood transfusion   . H/O hiatal hernia   . 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 Adenomatous colon polyp     Past Surgical History  Procedure Laterality Date  . Hemorroidectomy    . Appendectomy    . Vaginal  hysterectomy    . Breast biopsy      bilateral  . Cesarean section      x 3  . Appendectomy    . Esophagogastroduodenoscopy  06/12/2012    Procedure: ESOPHAGOGASTRODUODENOSCOPY (EGD);  Surgeon: Hart Carwin, MD;  Location: [facility];  Service: Endoscopy;  Laterality: N/A;  . Balloon  dilation  06/12/2012    Procedure: BALLOON DILATION;  Surgeon: Hart Carwin, MD;  Location: [facility];  Service: Endoscopy;  Laterality: N/A;  ?balloon  . Esophagogastroduodenoscopy  09/10/2012    Procedure: ESOPHAGOGASTRODUODENOSCOPY (EGD);  Surgeon: Hart Carwin, MD;  Location: [facility];  Service: Endoscopy;  Laterality: N/A;  . Hot hemostasis  09/10/2012    Procedure: HOT HEMOSTASIS (ARGON PLASMA COAGULATION/BICAP);  Surgeon: Hart Carwin, MD;  Location: [facility];  Service: Endoscopy;  Laterality: N/A;    Medications:  HOME MEDS: Prior to Admission medications   Medication Sig Start Date End Date Taking? Authorizing Provider  albuterol (PROAIR HFA) 108 (90 BASE) MCG/ACT inhaler Inhale 2 puffs into the lungs every 6 (six) hours as needed for shortness of breath.    Yes Historical Provider, MD  ALPRAZolam Prudy Feeler) 1 MG tablet Take 1 mg by mouth at bedtime.    Yes Historical Provider, MD  Armodafinil (NUVIGIL) 150 MG tablet Take 150 mg by mouth daily.   Yes Historical Provider, MD  atenolol (TENORMIN) 50 MG tablet Take 50 mg by mouth daily.   Yes Historical Provider, MD  b complex vitamins tablet Take 1 tablet by mouth daily.    Yes Historical Provider, MD  buPROPion (WELLBUTRIN XL) 150 MG 24 hr tablet Take 150 mg by mouth daily.   Yes Historical Provider, MD  carbidopa-levodopa (SINEMET CR) 25-100 MG per tablet Take 1 tablet by mouth 2 (two) times daily. Take with lunch and dinner   Yes Historical Provider, MD  desvenlafaxine (PRISTIQ) 100 MG 24 hr tablet Take 100 mg by mouth daily.   Yes Historical Provider, MD  dextromethorphan-guaiFENesin (MUCINEX DM) 30-600 MG per 12 hr tablet Take 1-2 tablets by mouth every 12 (twelve) hours.    Yes Historical Provider, MD  Fluticasone-Salmeterol (ADVAIR DISKUS) 250-50 MCG/DOSE AEPB Inhale 1 puff into the lungs 2 (two) times daily. 10/28/12  Yes Michele Mcalpine, MD  furosemide (LASIX) 40 MG tablet Take 1 tablet (40 mg total) by mouth daily. 10/04/12   Yes Michele Mcalpine, MD  glimepiride (AMARYL) 4 MG tablet Take 4 mg by mouth daily before breakfast.   Yes Historical Provider, MD  insulin glargine (LANTUS) 100 UNIT/ML injection Inject 30 Units into the skin daily.   Yes Historical Provider, MD  metFORMIN (GLUCOPHAGE) 500 MG tablet Take 500 mg by mouth 2 (two) times daily with a meal.   Yes Historical Provider, MD  Multiple Vitamins-Minerals (CENTRUM SILVER PO) Take 1 tablet by mouth daily.  11/22/11  Yes Historical Provider, MD  pantoprazole (PROTONIX) 40 MG tablet Take 40 mg by mouth daily.   Yes Historical Provider, MD  polyethylene glycol powder (GLYCOLAX/MIRALAX) powder Take 17 g by mouth daily as needed (for constipation).  06/05/12  Yes Hart Carwin, MD  potassium chloride SA (K-DUR,KLOR-CON) 20 MEQ tablet Take 20 mEq by mouth daily. 11/23/11  Yes Historical Provider, MD  promethazine (PHENERGAN) 25 MG tablet Take 1 tablet (25 mg total) by mouth every 6 (six) hours as needed. 12/30/12  Yes Michele Mcalpine, MD  QUEtiapine (SEROQUEL) 100 MG tablet Take 400 mg by mouth at bedtime.  11/22/11  Yes Historical Provider, MD  rOPINIRole (REQUIP) 4 MG tablet Take 4 mg by mouth at bedtime.   Yes Historical Provider, MD  Simethicone (GAS-X PO) Take 1 tablet by mouth daily. Takes after the biggest meal of the day   Yes Historical Provider, MD  simvastatin (ZOCOR) 40 MG tablet Take 40 mg by mouth at bedtime.   Yes Historical Provider, MD  sitaGLIPtin (JANUVIA) 100 MG tablet Take 100 mg by mouth daily.   Yes Historical Provider, MD  Vitamin D, Ergocalciferol, (DRISDOL) 50000 UNITS CAPS Take 1 capsule (50,000 Units total) by mouth every 7 (seven) days. Take on friday 02/24/12  Yes Michele Mcalpine, MD  B-D ULTRAFINE III SHORT PEN 31G X 8 MM MISC USE AS DIRECTED 12/17/11   Michele Mcalpine, MD     Allergies:  Allergies  Allergen Reactions  . Aspirin Other (See Comments)    Causes nosebleeds  . Penicillins Itching  . Theophylline Nausea And Vomiting    Social  History:   reports that she quit smoking about 20 years ago. Her smoking use included Cigarettes. She has a 37.5 pack-year smoking history. She has never used smokeless tobacco. She reports that  drinks alcohol. She reports that she does not use illicit drugs.  Family History: Family History  Problem Relation Age of Onset  . Heart disease Mother   . Breast cancer Sister   . Cervical cancer Mother   . Diabetes Father   . Colon cancer Neg Hx   . Breast cancer Maternal Aunt     x 2  . Diabetes Father   . Diabetes Sister   . Kidney disease Mother      Physical Exam: Filed Vitals:   01/18/13 0315 01/18/13 0344 01/18/13 0530 01/18/13 0545  BP: 137/56 166/50 122/45 112/45  Pulse: 68 75 67 67  Temp:  100.3 F (37.9 C) 98.5 F (36.9 C) 98.4 F (36.9 C)  TempSrc:  Oral Oral   Resp: 19 17 18 20   Weight:  99.3 kg (218 lb 14.7 oz)    SpO2: 99% 100%     Blood pressure 112/45, pulse 67, temperature 98.4 F (36.9 C), temperature source Oral, resp. rate 20, weight 99.3 kg (218 lb 14.7 oz), SpO2 100.00%.  GEN:  Pleasant  patient lying in the stretcher in no acute distress; cooperative with exam. She appears pale. PSYCH:  alert and oriented x4; does not appear anxious or depressed; affect is appropriate. HEENT: Mucous membranes pink and anicteric; PERRLA; EOM intact; no cervical lymphadenopathy nor thyromegaly or carotid bruit; no JVD; There were no stridor. Neck is very supple. Breasts:: Not examined CHEST WALL: No tenderness CHEST: Normal respiration, clear to auscultation bilaterally.  HEART: Regular rate and rhythm.  There are no murmur, rub, or gallops.   BACK: No kyphosis or scoliosis; no CVA tenderness ABDOMEN: soft and non-tender; no masses, no organomegaly, normal abdominal bowel sounds; no pannus; no intertriginous candida. There is no rebound and no distention. Rectal Exam: Per ER MD, small amount of bright red blood. EXTREMITIES: No bone or joint deformity; age-appropriate  arthropathy of the hands and knees; no edema; no ulcerations.  There is no calf tenderness. Genitalia: not examined PULSES: 2+ and symmetric SKIN: Normal hydration no rash or ulceration CNS: Cranial nerves 2-12 grossly intact no focal lateralizing neurologic deficit.  Speech is fluent; uvula elevated with phonation, facial symmetry and tongue midline. DTR are normal bilaterally, cerebella exam is intact, barbinski is negative and strengths are equaled  bilaterally.  No sensory loss.   Labs on Admission:  Basic Metabolic Panel:  Recent Labs Lab 01/18/13 0015  NA 135  K 3.9  CL 98  CO2 23  GLUCOSE 299*  BUN 18  CREATININE 0.88  CALCIUM 9.4   Liver Function Tests:  Recent Labs Lab 01/18/13 0015  AST 34  ALT 30  ALKPHOS 111  BILITOT 0.5  PROT 5.9*  ALBUMIN 2.8*   No results found for this basename: LIPASE, AMYLASE,  in the last 168 hours No results found for this basename: AMMONIA,  in the last 168 hours CBC:  Recent Labs Lab 01/15/13 1012 01/18/13 0015  WBC 4.2* 3.2*  NEUTROABS 3.0  --   HGB 7.7* 7.0*  HCT 22.8* 21.9*  MCV 95.1 96.5  PLT 94.0* 86*   Cardiac Enzymes: No results found for this basename: CKTOTAL, CKMB, CKMBINDEX, TROPONINI,  in the last 168 hours  CBG: No results found for this basename: GLUCAP,  in the last 168 hours   Radiological Exams on Admission: No results found.  Assessment/Plan Present on Admission:  . Anemia . AORTIC STENOSIS . ANGIODYSPLASIA, INTESTINE, WITHOUT HEMORRHAGE . HYPERTENSION . 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  It was known that the aortic pulse wave form from the aortic stenosis causing the worsening of the angiodysplasia, and bleeding would stop only when the aortic valve is repaired.  She is not a candidate for this.  I will admit her to telemetry and transfuse her 2 units of PRBC.  Please consult Dr Juanda Chance tomorrow for  further recommendation.  I have continued her meds, and will admit her to [facility] service. She is stable, full code, and will be admitted to [facility] service.  Thank you for allowing me to partake in the care of this nice patient.  Other plans as per orders.  Code Status: FULL Unk Lightning, MD. [facility] Pager [phone] 7pm to 7am.  01/18/2013, 5:57 AM

## 2013-01-19 ENCOUNTER — Telehealth: Payer: Self-pay | Admitting: Pulmonary Disease

## 2013-01-19 DIAGNOSIS — I5033 Acute on chronic diastolic (congestive) heart failure: Secondary | ICD-10-CM

## 2013-01-19 DIAGNOSIS — R0989 Other specified symptoms and signs involving the circulatory and respiratory systems: Secondary | ICD-10-CM

## 2013-01-19 DIAGNOSIS — I359 Nonrheumatic aortic valve disorder, unspecified: Secondary | ICD-10-CM

## 2013-01-19 DIAGNOSIS — K754 Autoimmune hepatitis: Secondary | ICD-10-CM

## 2013-01-19 DIAGNOSIS — D61818 Other pancytopenia: Secondary | ICD-10-CM

## 2013-01-19 DIAGNOSIS — E119 Type 2 diabetes mellitus without complications: Secondary | ICD-10-CM

## 2013-01-19 LAB — GLUCOSE, CAPILLARY
Glucose-Capillary: 159 mg/dL — ABNORMAL HIGH (ref 70–99)
Glucose-Capillary: 329 mg/dL — ABNORMAL HIGH (ref 70–99)

## 2013-01-19 LAB — CBC
Hemoglobin: 8.8 g/dL — ABNORMAL LOW (ref 12.0–15.0)
MCHC: 32.1 g/dL (ref 30.0–36.0)
RBC: 2.95 MIL/uL — ABNORMAL LOW (ref 3.87–5.11)
WBC: 3.8 10*3/uL — ABNORMAL LOW (ref 4.0–10.5)

## 2013-01-19 LAB — TYPE AND SCREEN
ABO/RH(D): O POS
Antibody Screen: POSITIVE
DAT, IgG: NEGATIVE
Donor AG Type: NEGATIVE
Donor AG Type: NEGATIVE
PT AG Type: NEGATIVE
Unit division: 0
Unit division: 0

## 2013-01-19 LAB — BASIC METABOLIC PANEL
GFR calc Af Amer: 78 mL/min — ABNORMAL LOW (ref >90)
GFR calc non Af Amer: 67 mL/min — ABNORMAL LOW (ref >90)
Potassium: 3.5 mEq/L (ref 3.5–5.1)
Sodium: 138 mEq/L (ref 135–145)

## 2013-01-19 MED ORDER — FUROSEMIDE 10 MG/ML IJ SOLN
20.0000 mg | Freq: Once | INTRAMUSCULAR | Status: AC
  Administered 2013-01-19: 20 mg via INTRAVENOUS
  Filled 2013-01-19: qty 2

## 2013-01-19 MED ORDER — SALINE SPRAY 0.65 % NA SOLN: 1.0000 | NASAL | Status: DC | PRN

## 2013-01-19 NOTE — Progress Notes (Signed)
     [facility] Daily Rounding Note 01/19/2013, 8:34 AM  SUBJECTIVE:       Had BM last PM, did not look at it but it felt "normal".  No nausea.  Some abdominal pain.   OBJECTIVE:         Vital signs in last 24 hours:    Temp:  [97.5 F (36.4 C)-98.9 F (37.2 C)] 98.3 F (36.8 C) (05/27 0510) Pulse Rate:  [56-73] 59 (05/27 0510) Resp:  [13-30] 20 (05/27 0510) BP: (101-165)/(41-103) 101/41 mmHg (05/27 0510) SpO2:  [92 %-100 %] 92 % (05/27 0510) Weight:  [99.746 kg (219 lb 14.4 oz)] 99.746 kg (219 lb 14.4 oz) (05/27 0517) Last BM Date: 01/18/13 General: looks chronically ill.    Heart: RRR Chest: clear B.  Dyspneic at rest and worse during speach Abdomen: obese, soft, NT, ND  Extremities: no CCE Neuro/Psych:  Pleasant, oriented x 3.  Tremulous.   Intake/Output from previous day: 05/26 0701 - 05/27 0700 In: 1742.5 [P.O.:780; I.V.:600; Blood:362.5] Out: 1801 [Urine:1800; Stool:1]  Intake/Output this shift: Total I/O In: 240 [P.O.:240] Out: 500 [Urine:500]  Lab Results:  Recent Labs  01/18/13 0015 01/18/13 1533 01/19/13 0512  WBC 3.2* 3.8* 3.8*  HGB 7.0* 9.1* 8.8*  HCT 21.9* 27.8* 27.4*  PLT 86* 84* 87*   BMET  Recent Labs  01/18/13 0015 01/19/13 0512  NA 135 138  K 3.9 3.5  CL 98 102  CO2 23 26  GLUCOSE 299* 174*  BUN 18 15  CREATININE 0.88 0.86  CALCIUM 9.4 8.7   LFT  Recent Labs  01/18/13 0015  PROT 5.9*  ALBUMIN 2.8*  AST 34  ALT 30  ALKPHOS 111  BILITOT 0.5   PT/INR  Recent Labs  01/18/13 1529  LABPROT 14.1  INR 1.10    ASSESMENT: *  Anemia, chronic and acute.  Blood per rectum and melena. S/p transfusion. Hgb relatively stable.  EGD 5/26:  Laser ablation of non-bleeding antral AVMs.  Small non-bleeding esophageal varices.  Portal gastropathy of antrum and cardia.  *  Epistaxis.  Duration of 2 months.  Last week had 3 episodes.? If this was source of melena/acute anemia? *  Autoimmune liver disease with cirrhosis.  No coagulopathy.  LFTs normal.  *  Adenomatous colon polyps 2008, year of last colonoscopy.  *   IDDM *  Thrombocytopenia.    PLAN: *  Advance diet.  *  Has Visit on 6/2 at 2 PM for RN visit regarding capsule endoscopy, at that appt the test itself will be arranged for future date.  *  Will sign off.   *  Has appt with Dr Everardo All re: diabetes, tomorrow.  ? Can she go home later today?  Otherwise will need to reschedule.    LOS: 2 days   Jennye Moccasin  01/19/2013, 8:34 AM Pager: [phone]  ________________________________________________________________________  [facility] MD note:  I personally examined the patient, reviewed the data and agree with the assessment and plan described above.  Stomach was retreated with APC yesterday, seems most likely to be the source of her GI blood loss however we are planning for out patient capsule endoscopy next week.  Please call or page with any further questions or concerns.    Rob Bunting, MD [facility] Pager [phone]

## 2013-01-19 NOTE — Discharge Summary (Addendum)
Physician Discharge Summary  [name] ZOX:[id number] DOB: [birth year] DOA: 01/17/2013  PCP: [name] Mcalpine, [name]  Admit date: 01/17/2013 Discharge date: 01/19/2013  Recommendations for Outpatient Follow-up:  1. Followup of with [name] in one week.  Consider addition of propranolol as an outpatient if blood pressure tolerates. 2. 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 female with hx of AS, angiodysplasia of the colon, intermittent rectal bleed since at least Jan 2014, iron deficiency anemia requiring IV iron infusion, morbid obesity, hyperlipidemia, presents to the ER with increase weakness, increase shortness of breath and more bleeding. Evaluation in the ER showed Hb of 7.7 g/DL, BS of 811, BUN 18. Rectal exam done in the ER showed small amount of bright red blood. Hospitalist was asked to admit her for slow lower rectal bleeding and anemia. She has seen [name] in the past, and was expected to receive blood transfusion next week. She maintained normal hemodynamics.  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  The patient underwent EGD on May 26 which demonstrated small esophageal varices which were not bleeding, portal gastropathy cardia and antrum with no active bleeding. She underwent laser ablation of multiple  AVMs in the antrum (gastric antral vascular ectasia).  Hemoglobin increased appropriately after blood transfusion. Her hemoglobin has been stable around 8.8 and 9 mg/dL. She has a followup appointment in one week for at capsule endoscopy with [name].   Due to relatively low blood pressures, I will not start a non-selective beta blocker at this time.    Epistaxis, likely due to AVM and ongoing oxygen via nasal canula.  Resolved spontaneously. The patient was given nasal saline and advised to hold pressure with a cold compress as needed.  COPD, stable: Continue albuterol when necessary  OSA, stable Morbid obesity, stable  Hypertension/HLD, stable:  Pressures low-normal.   Aortic valve stenosis, mild.   Chronic diastolic heart failure with grade 2 diastolic dysfunction and preserved ejection fraction, mild wheezing on 5/27 with increased cough.  Given one dose of IV lasix.  Likely had mild acute on chronic diastolic heart failure due to transfusion and IV fluids.   Type 2 diabetes mellitus, stable.  Patient may resume her oral anti-hyperglycemics at discharge. Other chronic medical conditions remained stable. Pancytopenia, stable, likely due to cirrhosis and recent GIB.  Follow up per primary care doctor.    Unsteadiness.  LIkely due to deconditioning and acute illness.  Offered inpatient PT evaluation, however, the patient declined.  She was amenable to home health PT evaluation, however, and this will be arranged through case management.  Consult has been placed.    Consultants:  GI, [name]  Procedures:  EGD with laser ablation of multiple AVMs on 5/26 Antibiotics:  None   Discharge Exam: Filed Vitals:   01/19/13 1109  BP:  119/42  Pulse: 60  Temp:   Resp:    Filed Vitals:   01/18/13 2005 01/19/13 0510 01/19/13 0517 01/19/13 1109  BP:  101/41  119/42  Pulse:  59  60  Temp:  98.3 F (36.8 C)    TempSrc:  Oral    Resp:  20    Height: 5\' 4"  (1.626 m)      Weight:   99.746 kg (219 lb 14.4 oz)   SpO2: 99% 92%     Patient states that she had a BM last night that was more formed.  Unclear if there was blood.  Mild abdominal pain and states that her breathing is [name] but about the same as yesterday.    General: Obese CF, No acute distress, pleasant HEENT: NCAT, MMM, nasal canula in place  Cardiovascular: RRR, nl S1, S2, 3/6 systolic murmur at the RUSB, no rg, 2+ pulses, warm extremities  Respiratory:  Full expiratory wheeze bilaterally, no rales or rhonchi, no increased WOB  Abdomen: NABS, soft, NT/ND  MSK: Normal tone and bulk, trace LEE  Neuro: Grossly intact   Discharge Instructions      Discharge Orders   Future Appointments Provider Department Dept Phone   01/20/2013 8:45 AM [name] [facility] PRIMARY CARE ENDOCRINOLOGY [phone]   01/21/2013 8:00 AM [facility] 8 [facility] MED[facility] DAY [facility] [phone]   03/11/2013 3:00 PM [name] Novas, [name] [facility] [phone]   03/29/2013 11:00 AM [name] Mcalpine, [name] [facility] [phone]   Future Orders Complete By Expires     (HEART FAILURE PATIENTS) Call [name]:  Anytime you have any of the following symptoms: 1) 3 pound weight gain in 24 hours or 5 pounds in 1 week 2) shortness of breath, with or without a dry hacking cough 3) swelling in the hands, feet or stomach 4) if you have to sleep on extra pillows at night in order to breathe.  As directed     Call [name] for:  difficulty breathing, headache or visual disturbances  As directed     Call [name] for:  extreme fatigue  As directed     Call [name] for:  hives  As directed     Call [name] for:  persistant dizziness or light-headedness  As directed     Call [name] for:  persistant nausea and vomiting  As directed     Call [name] for:  severe uncontrolled pain  As directed     Call [name] for:  temperature >100.4  As directed     Diet - low sodium heart healthy  As directed     Diet Carb Modified  As directed      Discharge instructions  As directed     Comments:      You were hospitalized with bleeding from your stomach.  You were transfused blood and underwent EGD which showed that you have changes to the blood vessels of the esophagus and stomach which are due to cirrhosis. You had laser ablation of some areas of the stomach which appeared to have recently bled or be at high risk for bleeding.  Your blood counts have been stable since.  For your nose bleeds, please use nasal saline as needed and try using some vaseline rubbed on the inside of your nares twice a day to prevent drying.    Increase activity slowly  As directed         Medication List    TAKE these medications  ALPRAZolam 1 MG tablet  Commonly known as:  XANAX  Take 1 mg by mouth at bedtime.     atenolol 50 MG tablet  Commonly known as:  TENORMIN  Take 50 mg by mouth daily.     b complex vitamins tablet  Take 1 tablet by mouth daily.     B-D ULTRAFINE III Monnie Gudgel PEN 31G X 8 MM Misc  Generic drug:  Insulin Pen Needle  USE AS DIRECTED     buPROPion 150 MG 24 hr tablet  Commonly known as:  WELLBUTRIN XL  Take 150 mg by mouth daily.     carbidopa-levodopa 25-100 MG per tablet  Commonly known as:  SINEMET CR  Take 1 tablet by mouth 2 (two) times daily. Take with lunch and dinner     CENTRUM SILVER PO  Take 1 tablet by mouth daily.     desvenlafaxine 100 MG 24 hr tablet  Commonly known as:  PRISTIQ  Take 100 mg by mouth daily.     dextromethorphan-guaiFENesin 30-600 MG per 12 hr tablet  Commonly known as:  MUCINEX DM  Take 1-2 tablets by mouth every 12 (twelve) hours.     Fluticasone-Salmeterol 250-50 MCG/DOSE Aepb  Commonly known as:  ADVAIR DISKUS  Inhale 1 puff into the lungs 2 (two) times daily.     furosemide 40 MG tablet  Commonly known as:  LASIX  Take 1 tablet (40 mg total) by mouth daily.     GAS-X PO  Take 1 tablet by mouth daily. Takes after the biggest meal of the day     glimepiride 4 MG  tablet  Commonly known as:  AMARYL  Take 4 mg by mouth daily before breakfast.     insulin glargine 100 UNIT/ML injection  Commonly known as:  LANTUS  Inject 30 Units into the skin daily.     metFORMIN 500 MG tablet  Commonly known as:  GLUCOPHAGE  Take 500 mg by mouth 2 (two) times daily with a meal.     NUVIGIL 150 MG tablet  Generic drug:  Armodafinil  Take 150 mg by mouth daily.     pantoprazole 40 MG tablet  Commonly known as:  PROTONIX  Take 40 mg by mouth daily.     polyethylene glycol powder powder  Commonly known as:  GLYCOLAX/MIRALAX  Take 17 g by mouth daily as needed (for constipation).     potassium chloride SA 20 MEQ tablet  Commonly known as:  K-DUR,KLOR-CON  Take 20 mEq by mouth daily.     PROAIR HFA 108 (90 BASE) MCG/ACT inhaler  Generic drug:  albuterol  Inhale 2 puffs into the lungs every 6 (six) hours as needed for shortness of breath.     promethazine 25 MG tablet  Commonly known as:  PHENERGAN  Take 1 tablet (25 mg total) by mouth every 6 (six) hours as needed.     QUEtiapine 100 MG tablet  Commonly known as:  SEROQUEL  Take 400 mg by mouth at bedtime.     rOPINIRole 4 MG tablet  Commonly known as:  REQUIP  Take 4 mg by mouth at bedtime.     simvastatin 40 MG tablet  Commonly known as:  ZOCOR  Take 40 mg by mouth at bedtime.     sitaGLIPtin 100 MG tablet  Commonly known as:  JANUVIA  Take 100 mg by mouth daily.     sodium chloride 0.65 % Soln nasal spray  Commonly known as:  OCEAN  Place 1 spray into the nose as needed for congestion.     Vitamin D (Ergocalciferol) 50000 UNITS Caps  Commonly known as:  DRISDOL  Take 1 capsule (50,000 Units total) by mouth every 7 (seven) days. Take on friday       Follow-up Information   Follow up with [name] On 01/25/2013. (2 PM for visit with GI nurse to arrange for capsule endoscopy study. )    Contact information:   [name] [place]  [address] [phone]      Follow up with NADEL,SCOTT M, [name]. Schedule an appointment as soon as possible for a visit in 1 week.   Contact information:   [address] [place] [address] [phone]        The results of significant diagnostics from this hospitalization (including imaging, microbiology, ancillary and laboratory) are listed below for reference.    Significant Diagnostic Studies: No results found.  Microbiology: No results found for this or any previous visit (from the past 240 hour(s)).   Labs: Basic Metabolic Panel:  Recent Labs Lab 01/18/13 0015 01/19/13 0512  NA 135 138  K 3.9 3.5  CL 98 102  CO2 23 26  GLUCOSE 299* 174*  BUN 18 15  CREATININE 0.88 0.86  CALCIUM 9.4 8.7   Liver Function Tests:  Recent Labs Lab 01/18/13 0015  AST 34  ALT 30  ALKPHOS 111  BILITOT 0.5  PROT 5.9*  ALBUMIN 2.8*   No results found for this basename: LIPASE, AMYLASE,  in the last 168 hours No results found for this basename: AMMONIA,  in the last 168 hours CBC:  Recent Labs Lab 01/15/13 1012 01/18/13 0015 01/18/13 1533 01/19/13 0512  WBC 4.2* 3.2* 3.8* 3.8*  NEUTROABS 3.0  --   --   --   HGB 7.7* 7.0* 9.1* 8.8*  HCT 22.8* 21.9* 27.8* 27.4*  MCV 95.1 96.5 92.7 92.9  PLT 94.0* 86* 84* 87*   Cardiac Enzymes: No results found for this basename: CKTOTAL, CKMB, CKMBINDEX, TROPONINI,  in the last 168 hours BNP: BNP (last 3 results)  Recent Labs  10/15/12 1349 11/12/12 1107 01/18/13 0027  PROBNP 164.0* 180.0* 167.9*   CBG:  Recent Labs Lab 01/18/13 1114 01/18/13 1624 01/18/13 2102 01/19/13 0608 01/19/13 1118  GLUCAP 239* 178* 258* 159* 329*    Time coordinating discharge: 45 minutes  Signed:  Mita Vallo  [facility] 01/19/2013, 12:41 PM

## 2013-01-19 NOTE — Discharge Summary (Signed)
Pt is going home with husband; he will pick her up about 1430; case manager is setting up home health

## 2013-01-19 NOTE — Telephone Encounter (Signed)
Called Dr. George Hugh office to cancel the appt for tomorrow for the pt.  i did leave a message and the number to call back if they need to speak with someone.  i called and lmom for the  pt and she is aware that the appt will be cancelled for her.

## 2013-01-19 NOTE — Care Management Note (Signed)
    Page 1 of 2   01/19/2013     3:37:51 PM   CARE MANAGEMENT NOTE 01/19/2013  Patient:  [name], [name]   Account Number:  [id number]  Date Initiated:  01/19/2013  Documentation initiated by:  Lyncoln Ledgerwood  Subjective/Objective Assessment:   PT ADM ON 01/17/13 WITH GAVE'S DISEASE.  PTA, PT LIVES AT HOME WITH HUSBAND.     Action/Plan:   MET WITH PT TO DISCUSS HOME NEEDS.  HHPT ORDERED; REFERRAL TO [facility], PER PT CHOICE.  PT STATES SHE NEEDS RW FOR HOME. WILL REQUEST FROM [facility].  START OF CARE FOR HH 24-48H POST DC DATE.   Anticipated DC Date:  01/19/2013   Anticipated DC Plan:  HOME W HOME HEALTH SERVICES      DC Planning Services  CM consult      [facility] Choice  HOME HEALTH   Choice offered to / List presented to:  C-1 Patient   DME arranged  Levan Hurst      DME agency  [facility]     HH arranged  HH-2 PT      [facility] agency  [facility]   Status of service:  Completed, signed off Medicare Important Message given?   (If response is "NO", the following Medicare IM given date fields will be blank) Date Medicare IM given:   Date Additional Medicare IM given:    Discharge Disposition:  HOME W HOME HEALTH SERVICES  Per UR Regulation:  Reviewed for med. necessity/level of care/duration of stay  If discussed at Long Length of Stay Meetings, dates discussed:    Comments:

## 2013-01-20 ENCOUNTER — Encounter (HOSPITAL_COMMUNITY): Payer: Self-pay | Admitting: Internal Medicine

## 2013-01-20 ENCOUNTER — Other Ambulatory Visit: Payer: Self-pay

## 2013-01-20 ENCOUNTER — Ambulatory Visit: Payer: Medicare Other | Admitting: Endocrinology

## 2013-01-20 DIAGNOSIS — D509 Iron deficiency anemia, unspecified: Secondary | ICD-10-CM

## 2013-01-21 ENCOUNTER — Inpatient Hospital Stay (HOSPITAL_COMMUNITY): Admission: RE | Admit: 2013-01-21 | Payer: Medicare Other | Source: Ambulatory Visit

## 2013-01-22 ENCOUNTER — Ambulatory Visit (INDEPENDENT_AMBULATORY_CARE_PROVIDER_SITE_OTHER): Payer: Medicare Other | Admitting: Endocrinology

## 2013-01-22 ENCOUNTER — Encounter: Payer: Self-pay | Admitting: Endocrinology

## 2013-01-22 VITALS — BP 136/82 | HR 68 | Ht 64.0 in | Wt 222.0 lb

## 2013-01-22 DIAGNOSIS — K7460 Unspecified cirrhosis of liver: Secondary | ICD-10-CM

## 2013-01-22 DIAGNOSIS — E119 Type 2 diabetes mellitus without complications: Secondary | ICD-10-CM

## 2013-01-22 DIAGNOSIS — E1049 Type 1 diabetes mellitus with other diabetic neurological complication: Secondary | ICD-10-CM

## 2013-01-22 NOTE — Progress Notes (Signed)
Subjective:    Patient ID: [name], female    DOB: 10/03/1940, 72 y.o.   MRN: [id number]  HPI pt states 10 years h/o dm, complicated by CAD and peripheral sensory neuropathy.  she has been on insulin x approx 4 years.  pt says his diet is poor, and exercise is limited by medical probs. Pt reports few mos of slight numbness of the toes, but no assoc pain.   Pt says her cbg's vary from 225-399.  She says it was high, even before her recent hospitalization.  She received a blood transfusion during this hospital visit, and in January.  However, she says she did not get one prior to the a1c in late 2013.  Past Medical History  Diagnosis Date  . Chronic airway obstruction, not elsewhere classified   . Unspecified chronic bronchitis   . Unspecified essential hypertension   . Coronary atherosclerosis of unspecified type of vessel, native or graft   . Palpitations   . Aortic valve disorders   . Pure hypercholesterolemia   . Morbid obesity   . Esophageal reflux   . Angiodysplasia of intestine (without mention of hemorrhage)   . Diverticulosis of colon (without mention of hemorrhage)   . Benign neoplasm of colon   . Autoimmune hepatitis   . Cystitis, unspecified   . Osteoarthrosis, unspecified whether generalized or localized, unspecified site   . Osteoporosis, unspecified   . Restless legs syndrome (RLS)   . Major depressive disorder, recurrent episode, severe, specified as with psychotic behavior   . Iron deficiency anemia secondary to blood loss (chronic)   . Coarse tremors     "just in my arms"  . Carpal tunnel syndrome on both sides   . Dysrhythmia     palpitations  . CHF (congestive heart failure)   . Asthma   . Shortness of breath     "any time really"  . Sleep apnea     "but I don't use the equipment"  . Type II diabetes mellitus   . Blood transfusion   . H/O hiatal hernia   . 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 Adenomatous colon polyp     Past Surgical History   Procedure Laterality Date  . Hemorroidectomy    . Appendectomy    . Vaginal hysterectomy    . Breast biopsy      bilateral  . Cesarean section      x 3  . Appendectomy    . Esophagogastroduodenoscopy  06/12/2012    Procedure: ESOPHAGOGASTRODUODENOSCOPY (EGD);  Surgeon: Hart Carwin, MD;  Location: [facility];  Service: Endoscopy;  Laterality: N/A;  . Balloon dilation  06/12/2012    Procedure: BALLOON DILATION;  Surgeon: Hart Carwin, MD;  Location: [facility];  Service: Endoscopy;  Laterality: N/A;  ?balloon  . Esophagogastroduodenoscopy  09/10/2012    Procedure: ESOPHAGOGASTRODUODENOSCOPY (EGD);  Surgeon: Hart Carwin, MD;  Location: [facility];  Service: Endoscopy;  Laterality: N/A;  . Hot hemostasis  09/10/2012    Procedure: HOT HEMOSTASIS (ARGON PLASMA COAGULATION/BICAP);  Surgeon: Hart Carwin, MD;  Location: [facility];  Service: Endoscopy;  Laterality: N/A;  . Esophagogastroduodenoscopy N/A 01/18/2013    Procedure: ESOPHAGOGASTRODUODENOSCOPY (EGD);  Surgeon: Hart Carwin, MD;  Location: [facility] END[facility]COPY;  Service: Endoscopy;  Laterality: N/A;    History   Social History  . Marital Status: Married    Spouse Name: jerry Belvin    Number of Children: N/A  . Years of  Education: N/A   Occupational History  . Disabled   .     Social History Main Topics  . Smoking status: Former Smoker -- 1.50 packs/day for 25 years    Types: Cigarettes    Quit date: 08/26/1992  . Smokeless tobacco: Never Used  . Alcohol Use: Yes     Comment: 11/22/11 "last alcoholic drink was years ago"  . Drug Use: No  . Sexually Active: No   Other Topics Concern  . Not on file   Social History Narrative  . No narrative on file    Current Outpatient Prescriptions on File Prior to Visit  Medication Sig Dispense Refill  . albuterol (PROAIR HFA) 108 (90 BASE) MCG/ACT inhaler Inhale 2 puffs into the lungs every 6 (six) hours as needed for shortness of breath.       . ALPRAZolam (XANAX) 1 MG  tablet Take 1 mg by mouth at bedtime.       . Armodafinil (NUVIGIL) 150 MG tablet Take 150 mg by mouth daily.      [name] atenolol (TENORMIN) 50 MG tablet Take 50 mg by mouth daily.      [name] b complex vitamins tablet Take 1 tablet by mouth daily.       . B-D ULTRAFINE III SHORT PEN 31G X 8 MM MISC USE AS DIRECTED  100 each  3  . buPROPion (WELLBUTRIN XL) 150 MG 24 hr tablet Take 150 mg by mouth daily.      . carbidopa-levodopa (SINEMET CR) 25-100 MG per tablet Take 1 tablet by mouth 2 (two) times daily. Take with lunch and dinner      . desvenlafaxine (PRISTIQ) 100 MG 24 hr tablet Take 100 mg by mouth daily.      [name] dextromethorphan-guaiFENesin (MUCINEX DM) 30-600 MG per 12 hr tablet Take 1-2 tablets by mouth every 12 (twelve) hours.       . Fluticasone-Salmeterol (ADVAIR DISKUS) 250-50 MCG/DOSE AEPB Inhale 1 puff into the lungs 2 (two) times daily.  1 each  0  . furosemide (LASIX) 40 MG tablet Take 1 tablet (40 mg total) by mouth daily.  30 tablet  6  . glimepiride (AMARYL) 4 MG tablet Take 4 mg by mouth daily before breakfast.      . insulin glargine (LANTUS) 100 UNIT/ML injection Inject 30 Units into the skin daily.      . metFORMIN (GLUCOPHAGE) 500 MG tablet Take 500 mg by mouth 2 (two) times daily with a meal.      . Multiple Vitamins-Minerals (CENTRUM SILVER PO) Take 1 tablet by mouth daily.       . pantoprazole (PROTONIX) 40 MG tablet Take 40 mg by mouth daily.      . polyethylene glycol powder (GLYCOLAX/MIRALAX) powder Take 17 g by mouth daily as needed (for constipation).       . potassium chloride SA (K-DUR,KLOR-CON) 20 MEQ tablet Take 20 mEq by mouth daily.      . promethazine (PHENERGAN) 25 MG tablet Take 1 tablet (25 mg total) by mouth every 6 (six) hours as needed.  30 tablet  6  . QUEtiapine (SEROQUEL) 100 MG tablet Take 400 mg by mouth at bedtime.       [name] rOPINIRole (REQUIP) 4 MG tablet Take 4 mg by mouth at bedtime.      . Simethicone (GAS-X PO) Take 1 tablet by mouth daily. Takes after  the biggest meal of the day      . simvastatin (ZOCOR) 40 MG  tablet Take 40 mg by mouth at bedtime.      . sitaGLIPtin (JANUVIA) 100 MG tablet Take 100 mg by mouth daily.      . sodium chloride (OCEAN) 0.65 % SOLN nasal spray Place 1 spray into the nose as needed for congestion.    0  . Vitamin D, Ergocalciferol, (DRISDOL) 50000 UNITS CAPS Take 1 capsule (50,000 Units total) by mouth every 7 (seven) days. Take on friday  5 capsule  11   No current facility-administered medications on file prior to visit.    Allergies  Allergen Reactions  . Aspirin Other (See Comments)    Causes nosebleeds  . Penicillins Itching  . Theophylline Nausea And Vomiting    Family History  Problem Relation Age of Onset  . Heart disease Mother   . Breast cancer Sister   . Cervical cancer Mother   . Diabetes Father   . Colon cancer Neg Hx   . Breast cancer Maternal Aunt     x 2  . Diabetes Father   . Diabetes Sister   . Kidney disease Mother     BP 136/82  Pulse 68  Ht 5\' 4"  (1.626 m)  Wt 222 lb (100.699 kg)  BMI 38.09 kg/m2  SpO2 93%  Review of Systems denies blurry vision, headache, chest pain, urinary frequency, cramps, memory loss, hypoglycemia, and rhinorrhea.  She has nausea, doe, nocturnal diaphoresis, easy bruising, depression, and weight gain.     Objective:   Physical Exam VS: see vs page GEN: no distress.  Obese.  Has 02 on HEAD: head: no deformity eyes: no periorbital swelling, no proptosis external nose and ears are normal mouth: no lesion seen NECK: supple, thyroid is not enlarged CHEST WALL: no deformity LUNGS: clear to auscultation CV: reg rate and rhythm, no murmur ABD: abdomen is soft, nontender.  no hepatosplenomegaly.  not distended.  no hernia.   MUSCULOSKELETAL: muscle bulk and strength are grossly normal.  no obvious joint swelling.  gait is normal and steady EXTEMITIES: no deformity.  no ulcer on the feet.  feet are of normal color and temp.  no edema PULSES:  dorsalis pedis intact bilat.  no carotid bruit NEURO:  cn 2-12 grossly intact.   readily moves all 4's.  sensation is intact to touch on the feet SKIN:  Normal texture and temperature.  No rash or suspicious lesion is visible.   NODES:  None palpable at the neck PSYCH: alert, oriented x3.  Does not appear anxious nor depressed.  Lab Results  Component Value Date   HGBA1C 5.0 05/26/2012      Assessment & Plan:  DM: based on her cbg's, she needs increased rx Anemia: he transfusions prevent interpretation of a1c.  i am uncertain how to interpret her low a1c from 2013. COPD and chf: these limit exercise rx of her DM.

## 2013-01-22 NOTE — Patient Instructions (Addendum)
good diet and exercise habits significanly improve the control of your diabetes.  please let me know if you wish to be referred to a dietician.  high blood sugar is very risky to your health.  you should see an eye doctor every year.  You are at higher than average risk for pneumonia and hepatitis-B.  You should be vaccinated against both.   controlling your blood pressure and cholesterol drastically reduces the damage diabetes does to your body.  this also applies to quitting smoking.  please discuss these with your doctor.  you should take an aspirin every day, unless you have been advised by a doctor not to. check your blood sugar twice a day.  vary the time of day when you check, between before the 3 meals, and at bedtime.  also check if you have symptoms of your blood sugar being too high or too low.  please keep a record of the readings and bring it to your next appointment here.  please call us sooner if your blood sugar goes below 70, or if you have a lot of readings over 200.   For now, please increase the lantus to 50 units daily. we will need to take this complex situation in stages. Please come back for a follow-up appointment in 2 weeks.

## 2013-01-26 ENCOUNTER — Telehealth: Payer: Self-pay | Admitting: Pulmonary Disease

## 2013-01-26 MED ORDER — FLUTICASONE-SALMETEROL 250-50 MCG/DOSE IN AEPB: 1.0000 | INHALATION_SPRAY | Freq: Two times a day (BID) | RESPIRATORY_TRACT | Status: DC

## 2013-01-26 NOTE — Telephone Encounter (Signed)
Called and spoke with pts husband and he is aware of samples left up front for them to pick up.

## 2013-01-26 NOTE — Telephone Encounter (Signed)
Called and spoke with pts husband and he stated that once he gets home he will have the pt call back to set up appt with SN.  He stated that she has several appts set up already and is not sure of all of these.

## 2013-01-26 NOTE — Telephone Encounter (Signed)
Pt returned call and can be reached @ [phone].[name]

## 2013-01-28 ENCOUNTER — Telehealth: Payer: Self-pay | Admitting: Pulmonary Disease

## 2013-01-28 ENCOUNTER — Telehealth: Payer: Self-pay | Admitting: *Deleted

## 2013-01-28 ENCOUNTER — Other Ambulatory Visit: Payer: Self-pay | Admitting: *Deleted

## 2013-01-28 DIAGNOSIS — D649 Anemia, unspecified: Secondary | ICD-10-CM

## 2013-01-28 DIAGNOSIS — J449 Chronic obstructive pulmonary disease, unspecified: Secondary | ICD-10-CM

## 2013-01-28 NOTE — Telephone Encounter (Signed)
Spoke with patient and she will come for lab next week.

## 2013-01-28 NOTE — Telephone Encounter (Signed)
I spoke with Trey Paula. He states pt struggles walking with her walking lugging the big O2 tank around. He is just wanting an order for O2 assessment to see if she qualifies for portable o2. She is using 4 liters so she may not be able to but he says its worth a try. If this is okay just send an order to [facility] not need to call him back. Please advise SN thanks

## 2013-01-28 NOTE — Telephone Encounter (Signed)
Per SN---ok to send an order in for this.  thanks

## 2013-01-28 NOTE — Telephone Encounter (Signed)
Order placed.  Per Jeff's request below, will not call him back.  Will sign off.

## 2013-01-28 NOTE — Telephone Encounter (Signed)
Message copied by Daphine Deutscher on Thu Jan 28, 2013  8:58 AM ------      Message from: Daphine Deutscher      Created: Thu Dec 17, 2012  3:45 PM       Call and remind patient CBC due 02/01/13 DB ------

## 2013-01-29 ENCOUNTER — Other Ambulatory Visit (INDEPENDENT_AMBULATORY_CARE_PROVIDER_SITE_OTHER): Payer: Medicare Other

## 2013-01-29 ENCOUNTER — Telehealth: Payer: Self-pay | Admitting: Pulmonary Disease

## 2013-01-29 DIAGNOSIS — D649 Anemia, unspecified: Secondary | ICD-10-CM

## 2013-01-29 LAB — CBC WITH DIFFERENTIAL/PLATELET
Basophils Absolute: 0 10*3/uL (ref 0.0–0.1)
Eosinophils Relative: 2.6 % (ref 0.0–5.0)
HCT: 25.8 % — ABNORMAL LOW (ref 36.0–46.0)
Hemoglobin: 8.8 g/dL — ABNORMAL LOW (ref 12.0–15.0)
Lymphs Abs: 0.4 10*3/uL — ABNORMAL LOW (ref 0.7–4.0)
MCV: 90.8 fl (ref 78.0–100.0)
Monocytes Absolute: 0.4 10*3/uL (ref 0.1–1.0)
Neutro Abs: 3.5 10*3/uL (ref 1.4–7.7)
Platelets: 99 10*3/uL — ABNORMAL LOW (ref 150.0–400.0)
RDW: 17.8 % — ABNORMAL HIGH (ref 11.5–14.6)

## 2013-01-29 NOTE — Telephone Encounter (Signed)
I spoke with [name] and gave VO for this.   --per leigh okay to do so.  Nothing further was needed

## 2013-01-29 NOTE — Telephone Encounter (Signed)
Pt returned Leigh's call.  Set pt up w/ SN on 6/12 @ 11:30.  Pt verbalized understanding & stated nothing further needed at this time.  Antionette Fairy

## 2013-01-29 NOTE — Telephone Encounter (Signed)
[email or web address] to set up HFU on 6/12 at 11:30.

## 2013-02-01 ENCOUNTER — Other Ambulatory Visit: Payer: Self-pay | Admitting: *Deleted

## 2013-02-01 ENCOUNTER — Emergency Department (HOSPITAL_COMMUNITY): Payer: Medicare Other

## 2013-02-01 ENCOUNTER — Telehealth: Payer: Self-pay | Admitting: Internal Medicine

## 2013-02-01 ENCOUNTER — Inpatient Hospital Stay (HOSPITAL_COMMUNITY)
Admission: EM | Admit: 2013-02-01 | Discharge: 2013-02-04 | DRG: 391 | Disposition: A | Discharge status: Home / Self Care | Payer: Medicare Other | Attending: Internal Medicine | Admitting: Internal Medicine

## 2013-02-01 ENCOUNTER — Encounter (HOSPITAL_COMMUNITY): Payer: Self-pay | Admitting: Family Medicine

## 2013-02-01 DIAGNOSIS — D509 Iron deficiency anemia, unspecified: Secondary | ICD-10-CM

## 2013-02-01 DIAGNOSIS — Z79899 Other long term (current) drug therapy: Secondary | ICD-10-CM

## 2013-02-01 DIAGNOSIS — Z860100 Personal history of colon polyps, unspecified: Secondary | ICD-10-CM

## 2013-02-01 DIAGNOSIS — E860 Dehydration: Secondary | ICD-10-CM | POA: Diagnosis present

## 2013-02-01 DIAGNOSIS — K5901 Slow transit constipation: Secondary | ICD-10-CM

## 2013-02-01 DIAGNOSIS — K31819 Angiodysplasia of stomach and duodenum without bleeding: Secondary | ICD-10-CM

## 2013-02-01 DIAGNOSIS — Z6838 Body mass index (BMI) 38.0-38.9, adult: Secondary | ICD-10-CM

## 2013-02-01 DIAGNOSIS — I5032 Chronic diastolic (congestive) heart failure: Secondary | ICD-10-CM | POA: Diagnosis present

## 2013-02-01 DIAGNOSIS — I8510 Secondary esophageal varices without bleeding: Secondary | ICD-10-CM | POA: Diagnosis present

## 2013-02-01 DIAGNOSIS — E119 Type 2 diabetes mellitus without complications: Secondary | ICD-10-CM

## 2013-02-01 DIAGNOSIS — G2581 Restless legs syndrome: Secondary | ICD-10-CM

## 2013-02-01 DIAGNOSIS — F333 Major depressive disorder, recurrent, severe with psychotic symptoms: Secondary | ICD-10-CM

## 2013-02-01 DIAGNOSIS — D649 Anemia, unspecified: Secondary | ICD-10-CM

## 2013-02-01 DIAGNOSIS — J9610 Chronic respiratory failure, unspecified whether with hypoxia or hypercapnia: Secondary | ICD-10-CM | POA: Diagnosis present

## 2013-02-01 DIAGNOSIS — R251 Tremor, unspecified: Secondary | ICD-10-CM

## 2013-02-01 DIAGNOSIS — D61818 Other pancytopenia: Secondary | ICD-10-CM

## 2013-02-01 DIAGNOSIS — E669 Obesity, unspecified: Secondary | ICD-10-CM

## 2013-02-01 DIAGNOSIS — K922 Gastrointestinal hemorrhage, unspecified: Secondary | ICD-10-CM

## 2013-02-01 DIAGNOSIS — M1990 Unspecified osteoarthritis, unspecified site: Secondary | ICD-10-CM

## 2013-02-01 DIAGNOSIS — K754 Autoimmune hepatitis: Secondary | ICD-10-CM

## 2013-02-01 DIAGNOSIS — R531 Weakness: Secondary | ICD-10-CM

## 2013-02-01 DIAGNOSIS — E1049 Type 1 diabetes mellitus with other diabetic neurological complication: Secondary | ICD-10-CM

## 2013-02-01 DIAGNOSIS — F410 Panic disorder [episodic paroxysmal anxiety] without agoraphobia: Secondary | ICD-10-CM | POA: Diagnosis not present

## 2013-02-01 DIAGNOSIS — K5520 Angiodysplasia of colon without hemorrhage: Secondary | ICD-10-CM | POA: Diagnosis present

## 2013-02-01 DIAGNOSIS — J42 Unspecified chronic bronchitis: Secondary | ICD-10-CM

## 2013-02-01 DIAGNOSIS — K5730 Diverticulosis of large intestine without perforation or abscess without bleeding: Secondary | ICD-10-CM

## 2013-02-01 DIAGNOSIS — K219 Gastro-esophageal reflux disease without esophagitis: Secondary | ICD-10-CM

## 2013-02-01 DIAGNOSIS — M810 Age-related osteoporosis without current pathological fracture: Secondary | ICD-10-CM

## 2013-02-01 DIAGNOSIS — D500 Iron deficiency anemia secondary to blood loss (chronic): Secondary | ICD-10-CM

## 2013-02-01 DIAGNOSIS — D696 Thrombocytopenia, unspecified: Secondary | ICD-10-CM | POA: Diagnosis present

## 2013-02-01 DIAGNOSIS — I10 Essential (primary) hypertension: Secondary | ICD-10-CM

## 2013-02-01 DIAGNOSIS — K7460 Unspecified cirrhosis of liver: Secondary | ICD-10-CM | POA: Diagnosis present

## 2013-02-01 DIAGNOSIS — N3090 Cystitis, unspecified without hematuria: Secondary | ICD-10-CM

## 2013-02-01 DIAGNOSIS — IMO0001 Reserved for inherently not codable concepts without codable children: Secondary | ICD-10-CM

## 2013-02-01 DIAGNOSIS — R259 Unspecified abnormal involuntary movements: Secondary | ICD-10-CM | POA: Diagnosis present

## 2013-02-01 DIAGNOSIS — I8500 Esophageal varices without bleeding: Secondary | ICD-10-CM

## 2013-02-01 DIAGNOSIS — D126 Benign neoplasm of colon, unspecified: Secondary | ICD-10-CM

## 2013-02-01 DIAGNOSIS — Z9981 Dependence on supplemental oxygen: Secondary | ICD-10-CM

## 2013-02-01 DIAGNOSIS — I5033 Acute on chronic diastolic (congestive) heart failure: Secondary | ICD-10-CM

## 2013-02-01 DIAGNOSIS — R1310 Dysphagia, unspecified: Secondary | ICD-10-CM

## 2013-02-01 DIAGNOSIS — I472 Ventricular tachycardia, unspecified: Secondary | ICD-10-CM

## 2013-02-01 DIAGNOSIS — I509 Heart failure, unspecified: Secondary | ICD-10-CM | POA: Diagnosis present

## 2013-02-01 DIAGNOSIS — R002 Palpitations: Secondary | ICD-10-CM

## 2013-02-01 DIAGNOSIS — J449 Chronic obstructive pulmonary disease, unspecified: Secondary | ICD-10-CM | POA: Diagnosis present

## 2013-02-01 DIAGNOSIS — J441 Chronic obstructive pulmonary disease with (acute) exacerbation: Secondary | ICD-10-CM | POA: Diagnosis present

## 2013-02-01 DIAGNOSIS — N390 Urinary tract infection, site not specified: Secondary | ICD-10-CM

## 2013-02-01 DIAGNOSIS — J4489 Other specified chronic obstructive pulmonary disease: Secondary | ICD-10-CM

## 2013-02-01 DIAGNOSIS — A498 Other bacterial infections of unspecified site: Secondary | ICD-10-CM | POA: Diagnosis present

## 2013-02-01 DIAGNOSIS — N3000 Acute cystitis without hematuria: Secondary | ICD-10-CM

## 2013-02-01 DIAGNOSIS — K5521 Angiodysplasia of colon with hemorrhage: Secondary | ICD-10-CM | POA: Diagnosis present

## 2013-02-01 DIAGNOSIS — I359 Nonrheumatic aortic valve disorder, unspecified: Secondary | ICD-10-CM

## 2013-02-01 DIAGNOSIS — R112 Nausea with vomiting, unspecified: Principal | ICD-10-CM

## 2013-02-01 DIAGNOSIS — E7800 Pure hypercholesterolemia, unspecified: Secondary | ICD-10-CM

## 2013-02-01 DIAGNOSIS — I2510 Atherosclerotic heart disease of native coronary artery without angina pectoris: Secondary | ICD-10-CM

## 2013-02-01 LAB — URINE MICROSCOPIC-ADD ON

## 2013-02-01 LAB — URINALYSIS, ROUTINE W REFLEX MICROSCOPIC
Bilirubin Urine: NEGATIVE
Glucose, UA: NEGATIVE mg/dL
Hgb urine dipstick: NEGATIVE
Specific Gravity, Urine: 1.016 (ref 1.005–1.030)
Urobilinogen, UA: 1 mg/dL (ref 0.0–1.0)
pH: 6 (ref 5.0–8.0)

## 2013-02-01 LAB — POCT I-STAT TROPONIN I: Troponin i, poc: 0.02 ng/mL (ref 0.00–0.08)

## 2013-02-01 LAB — CBC WITH DIFFERENTIAL/PLATELET
Basophils Absolute: 0 10*3/uL (ref 0.0–0.1)
Basophils Relative: 1 % (ref 0–1)
Eosinophils Absolute: 0.1 10*3/uL (ref 0.0–0.7)
Eosinophils Relative: 4 % (ref 0–5)
HCT: 27.3 % — ABNORMAL LOW (ref 36.0–46.0)
Lymphocytes Relative: 11 % — ABNORMAL LOW (ref 12–46)
MCHC: 31.9 g/dL (ref 30.0–36.0)
MCV: 93.5 fL (ref 78.0–100.0)
Monocytes Absolute: 0.3 10*3/uL (ref 0.1–1.0)
Platelets: 81 10*3/uL — ABNORMAL LOW (ref 150–400)
RDW: 17.4 % — ABNORMAL HIGH (ref 11.5–15.5)
WBC: 3.5 10*3/uL — ABNORMAL LOW (ref 4.0–10.5)

## 2013-02-01 LAB — COMPREHENSIVE METABOLIC PANEL
ALT: 16 U/L (ref 0–35)
AST: 39 U/L — ABNORMAL HIGH (ref 0–37)
Albumin: 3.1 g/dL — ABNORMAL LOW (ref 3.5–5.2)
CO2: 26 mEq/L (ref 19–32)
Calcium: 9.7 mg/dL (ref 8.4–10.5)
Creatinine, Ser: 0.92 mg/dL (ref 0.50–1.10)
GFR calc non Af Amer: 62 mL/min — ABNORMAL LOW (ref >90)
Sodium: 137 mEq/L (ref 135–145)
Total Protein: 6.4 g/dL (ref 6.0–8.3)

## 2013-02-01 LAB — OCCULT BLOOD, POC DEVICE: Fecal Occult Bld: POSITIVE — AB

## 2013-02-01 LAB — GLUCOSE, CAPILLARY
Glucose-Capillary: 149 mg/dL — ABNORMAL HIGH (ref 70–99)
Glucose-Capillary: 92 mg/dL (ref 70–99)

## 2013-02-01 LAB — LIPASE, BLOOD: Lipase: 53 U/L (ref 11–59)

## 2013-02-01 MED ORDER — CIPROFLOXACIN IN D5W 400 MG/200ML IV SOLN
400.0000 mg | Freq: Two times a day (BID) | INTRAVENOUS | Status: DC
  Administered 2013-02-02 – 2013-02-03 (×4): 400 mg via INTRAVENOUS
  Filled 2013-02-01 (×5): qty 200

## 2013-02-01 MED ORDER — ACETAMINOPHEN 650 MG RE SUPP: 650.0000 mg | Freq: Four times a day (QID) | RECTAL | Status: DC | PRN

## 2013-02-01 MED ORDER — ARMODAFINIL 150 MG PO TABS: 150.0000 mg | ORAL_TABLET | Freq: Every day | ORAL | Status: DC

## 2013-02-01 MED ORDER — MOMETASONE FURO-FORMOTEROL FUM 100-5 MCG/ACT IN AERO
2.0000 | INHALATION_SPRAY | Freq: Two times a day (BID) | RESPIRATORY_TRACT | Status: DC
  Administered 2013-02-02 – 2013-02-04 (×4): 2 via RESPIRATORY_TRACT
  Filled 2013-02-01: qty 8.8

## 2013-02-01 MED ORDER — CARBIDOPA-LEVODOPA CR 25-100 MG PO TBCR
1.0000 | EXTENDED_RELEASE_TABLET | Freq: Two times a day (BID) | ORAL | Status: DC
  Administered 2013-02-02 – 2013-02-04 (×5): 1 via ORAL
  Filled 2013-02-01 (×7): qty 1

## 2013-02-01 MED ORDER — POTASSIUM CHLORIDE CRYS ER 20 MEQ PO TBCR
20.0000 meq | EXTENDED_RELEASE_TABLET | Freq: Every day | ORAL | Status: DC
  Filled 2013-02-01: qty 1

## 2013-02-01 MED ORDER — DM-GUAIFENESIN ER 30-600 MG PO TB12
1.0000 | ORAL_TABLET | Freq: Two times a day (BID) | ORAL | Status: DC
  Administered 2013-02-01 – 2013-02-03 (×5): 1 via ORAL
  Filled 2013-02-01 (×7): qty 2

## 2013-02-01 MED ORDER — QUETIAPINE FUMARATE 400 MG PO TABS
400.0000 mg | ORAL_TABLET | Freq: Every day | ORAL | Status: DC
  Administered 2013-02-02 – 2013-02-03 (×3): 400 mg via ORAL
  Filled 2013-02-01 (×5): qty 1

## 2013-02-01 MED ORDER — ARMODAFINIL 150 MG PO TABS
150.0000 mg | ORAL_TABLET | Freq: Every morning | ORAL | Status: DC
  Filled 2013-02-01 (×5): qty 1

## 2013-02-01 MED ORDER — ATENOLOL 50 MG PO TABS
50.0000 mg | ORAL_TABLET | Freq: Every day | ORAL | Status: DC
  Administered 2013-02-02 – 2013-02-04 (×3): 50 mg via ORAL
  Filled 2013-02-01 (×3): qty 1

## 2013-02-01 MED ORDER — ONDANSETRON HCL 4 MG/2ML IJ SOLN
4.0000 mg | Freq: Once | INTRAMUSCULAR | Status: AC
  Administered 2013-02-01: 4 mg via INTRAVENOUS
  Filled 2013-02-01: qty 2

## 2013-02-01 MED ORDER — ACETAMINOPHEN 325 MG PO TABS: 650.0000 mg | ORAL_TABLET | Freq: Four times a day (QID) | ORAL | Status: DC | PRN

## 2013-02-01 MED ORDER — DEXTROSE 5 % IV SOLN
1.0000 g | Freq: Once | INTRAVENOUS | Status: AC
  Administered 2013-02-01: 1 g via INTRAVENOUS
  Filled 2013-02-01: qty 10

## 2013-02-01 MED ORDER — SODIUM CHLORIDE 0.9 % IJ SOLN
3.0000 mL | Freq: Two times a day (BID) | INTRAMUSCULAR | Status: DC
  Administered 2013-02-02 – 2013-02-03 (×4): 3 mL via INTRAVENOUS

## 2013-02-01 MED ORDER — ALPRAZOLAM 0.5 MG PO TABS
1.0000 mg | ORAL_TABLET | Freq: Every day | ORAL | Status: DC
  Administered 2013-02-02 – 2013-02-03 (×3): 1 mg via ORAL
  Filled 2013-02-01 (×3): qty 2
  Filled 2013-02-01: qty 1

## 2013-02-01 MED ORDER — SODIUM CHLORIDE 0.9 % IV BOLUS (SEPSIS)
500.0000 mL | Freq: Once | INTRAVENOUS | Status: AC
  Administered 2013-02-01: 500 mL via INTRAVENOUS

## 2013-02-01 MED ORDER — ROPINIROLE HCL 1 MG PO TABS
4.0000 mg | ORAL_TABLET | Freq: Every day | ORAL | Status: DC
  Administered 2013-02-02 – 2013-02-03 (×3): 4 mg via ORAL
  Filled 2013-02-01 (×4): qty 4

## 2013-02-01 MED ORDER — ONDANSETRON HCL 4 MG/2ML IJ SOLN: 4.0000 mg | Freq: Four times a day (QID) | INTRAMUSCULAR | Status: DC | PRN

## 2013-02-01 MED ORDER — SODIUM CHLORIDE 0.9 % IV SOLN
INTRAVENOUS | Status: DC
  Administered 2013-02-02: 20 mL/h via INTRAVENOUS

## 2013-02-01 MED ORDER — GLIMEPIRIDE 4 MG PO TABS
4.0000 mg | ORAL_TABLET | Freq: Every day | ORAL | Status: DC
  Administered 2013-02-02 – 2013-02-04 (×3): 4 mg via ORAL
  Filled 2013-02-01 (×4): qty 1

## 2013-02-01 MED ORDER — LINAGLIPTIN 5 MG PO TABS
5.0000 mg | ORAL_TABLET | Freq: Every day | ORAL | Status: DC
  Administered 2013-02-02 – 2013-02-04 (×3): 5 mg via ORAL
  Filled 2013-02-01 (×3): qty 1

## 2013-02-01 MED ORDER — PANTOPRAZOLE SODIUM 40 MG PO TBEC
40.0000 mg | DELAYED_RELEASE_TABLET | Freq: Every day | ORAL | Status: DC
  Administered 2013-02-02 – 2013-02-04 (×3): 40 mg via ORAL
  Filled 2013-02-01 (×4): qty 1

## 2013-02-01 MED ORDER — INSULIN ASPART 100 UNIT/ML ~~LOC~~ SOLN
0.0000 [IU] | Freq: Three times a day (TID) | SUBCUTANEOUS | Status: DC
  Administered 2013-02-02: 2 [IU] via SUBCUTANEOUS
  Administered 2013-02-02: 1 [IU] via SUBCUTANEOUS
  Administered 2013-02-02: 7 [IU] via SUBCUTANEOUS
  Administered 2013-02-03: 3 [IU] via SUBCUTANEOUS
  Administered 2013-02-03: 5 [IU] via SUBCUTANEOUS
  Administered 2013-02-03 – 2013-02-04 (×2): 1 [IU] via SUBCUTANEOUS

## 2013-02-01 MED ORDER — BUPROPION HCL ER (XL) 150 MG PO TB24
150.0000 mg | ORAL_TABLET | Freq: Every day | ORAL | Status: DC
  Administered 2013-02-02 – 2013-02-04 (×3): 150 mg via ORAL
  Filled 2013-02-01 (×3): qty 1

## 2013-02-01 MED ORDER — SALINE SPRAY 0.65 % NA SOLN
1.0000 | NASAL | Status: DC | PRN
  Administered 2013-02-02 (×2): 1 via NASAL
  Filled 2013-02-01: qty 44

## 2013-02-01 MED ORDER — SODIUM CHLORIDE 0.9 % IV SOLN: INTRAVENOUS | Status: DC

## 2013-02-01 MED ORDER — SIMVASTATIN 40 MG PO TABS
40.0000 mg | ORAL_TABLET | Freq: Every day | ORAL | Status: DC
  Administered 2013-02-02 – 2013-02-03 (×3): 40 mg via ORAL
  Filled 2013-02-01 (×4): qty 1

## 2013-02-01 MED ORDER — FUROSEMIDE 40 MG PO TABS
40.0000 mg | ORAL_TABLET | Freq: Every day | ORAL | Status: DC
  Administered 2013-02-02 – 2013-02-04 (×3): 40 mg via ORAL
  Filled 2013-02-01 (×3): qty 1

## 2013-02-01 MED ORDER — ONDANSETRON HCL 4 MG PO TABS: 4.0000 mg | ORAL_TABLET | Freq: Four times a day (QID) | ORAL | Status: DC | PRN

## 2013-02-01 MED ORDER — ALBUTEROL SULFATE HFA 108 (90 BASE) MCG/ACT IN AERS
2.0000 | INHALATION_SPRAY | Freq: Four times a day (QID) | RESPIRATORY_TRACT | Status: DC | PRN
  Filled 2013-02-01: qty 6.7

## 2013-02-01 MED ORDER — DESVENLAFAXINE SUCCINATE ER 100 MG PO TB24
100.0000 mg | ORAL_TABLET | Freq: Every day | ORAL | Status: DC
  Administered 2013-02-02 – 2013-02-04 (×3): 100 mg via ORAL
  Filled 2013-02-01 (×3): qty 1

## 2013-02-01 MED ORDER — INSULIN GLARGINE 100 UNIT/ML ~~LOC~~ SOLN
40.0000 [IU] | Freq: Every day | SUBCUTANEOUS | Status: DC
  Administered 2013-02-02 – 2013-02-04 (×3): 40 [IU] via SUBCUTANEOUS
  Filled 2013-02-01 (×3): qty 0.4

## 2013-02-01 MED ORDER — ONDANSETRON HCL 4 MG/2ML IJ SOLN: 4.0000 mg | Freq: Three times a day (TID) | INTRAMUSCULAR | Status: AC | PRN

## 2013-02-01 NOTE — Telephone Encounter (Signed)
Phenergan 12.5 mg, #30 1 po q4-6 hrs prn nausea, take before meals to prevent vomitind

## 2013-02-01 NOTE — ED Provider Notes (Signed)
Medical screening examination/treatment/procedure(s) were conducted as a shared visit with non-physician practitioner(s) and myself.  I personally evaluated the patient during the encounter   Patient seen and examined. Hemoglobin noted as well as her guaiac positive stools. UTI noted as well. Patient notes that she still feels weak despite IV fluids. We'll start her on antibiotics and she'll be admitted  Toy Baker, MD 02/01/13 [phone]

## 2013-02-01 NOTE — Progress Notes (Signed)
72yo female c/o weakness and N/V, did not eat but used insulin, CBG on arrival 149, down to 92 three hours later, has had anemia, guaiac pos stool as well, to be admitted for UTI and further observation.  Will begin Cipro 400mg  IV Q12H and monitor CBC and clinical progression.  Vernard Gambles, PharmD, BCPS 02/01/2013 11:50 PM

## 2013-02-01 NOTE — Telephone Encounter (Signed)
Patient calling to report she eats ice d/t anemia. States she is now vomiting up "hot water" when she eats ice. Reports nausea. States she has not eaten anything since breakfast. She has taken her insulin. Instructed patient that if she cannot eat/drink and she took insulin, needs to go to ED or urgent care for evaluation. Patient understands.

## 2013-02-01 NOTE — ED Notes (Signed)
Spoke with [name] in the Lab. She will add Lipase to the blood work stored in [employer].

## 2013-02-01 NOTE — ED Notes (Signed)
CBG 149. 

## 2013-02-01 NOTE — H&P (Signed)
[facility] History and Physical  [name] ZOX:[id number] DOB: 1941/01/17 DOA: 02/01/2013  Referring physician: ER physician. PCP: Michele Mcalpine, MD  Specialists: Dr. Juanda Chance. Gastroenterologist.  Chief Complaint: Nausea vomiting and weakness.  HPI: [name] is a 72 y.o. female with known history of angiodysplasia of the intestine, cirrhosis of liver with varices, diabetes mellitus type 2, pancytopenia, aortic stenosis, COPD on home oxygen 4 L, presented to the ER because of nausea vomiting. Patient has been having nausea vomiting for last 2 days with weakness and mild shortness of breath. In the ER patient was found to have UA compatible with UTI. Patient has been in for further management. Patient states her nausea vomiting is mostly for liquid diet. Denies any abdominal pain diarrhea fever chills or any productive cough. Patient was a candidate for GI bleed and EGD done showed esophageal varices with portal gastropathy and had AVMs and underwent laser ablation. Patient is supposed to have outpatient capsule endoscopy. Patient still has ongoing mild rectal bleeding. Guaiac was positive but hemoglobin has been stable.  Review of Systems: As presented in the history of presenting illness, rest negative.  Past Medical History  Diagnosis Date  . Chronic airway obstruction, not elsewhere classified   . Unspecified chronic bronchitis   . Unspecified essential hypertension   . Coronary atherosclerosis of unspecified type of vessel, native or graft   . Palpitations   . Aortic valve disorders   . Pure hypercholesterolemia   . Morbid obesity   . Esophageal reflux   . Angiodysplasia of intestine (without mention of hemorrhage)   . Diverticulosis of colon (without mention of hemorrhage)   . Benign neoplasm of colon   . Autoimmune hepatitis   . Cystitis, unspecified   . Osteoarthrosis, unspecified whether generalized or localized, unspecified site   . Osteoporosis, unspecified    . Restless legs syndrome (RLS)   . Major depressive disorder, recurrent episode, severe, specified as with psychotic behavior   . Iron deficiency anemia secondary to blood loss (chronic)   . Coarse tremors     "just in my arms"  . Carpal tunnel syndrome on both sides   . Dysrhythmia     palpitations  . CHF (congestive heart failure)   . Asthma   . Shortness of breath     "any time really"  . Sleep apnea     "but I don't use the equipment"  . Type II diabetes mellitus   . Blood transfusion   . H/O hiatal hernia   . 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 Adenomatous colon polyp    Past Surgical History  Procedure Laterality Date  . Hemorroidectomy    . Appendectomy    . Vaginal hysterectomy    . Breast biopsy      bilateral  . Cesarean section      x 3  . Appendectomy    . Esophagogastroduodenoscopy  06/12/2012    Procedure: ESOPHAGOGASTRODUODENOSCOPY (EGD);  Surgeon: Hart Carwin, MD;  Location: [facility];  Service: Endoscopy;  Laterality: N/A;  . Balloon dilation  06/12/2012    Procedure: BALLOON DILATION;  Surgeon: Hart Carwin, MD;  Location: [facility];  Service: Endoscopy;  Laterality: N/A;  ?balloon  . Esophagogastroduodenoscopy  09/10/2012    Procedure: ESOPHAGOGASTRODUODENOSCOPY (EGD);  Surgeon: Hart Carwin, MD;  Location: [facility];  Service: Endoscopy;  Laterality: N/A;  . Hot hemostasis  09/10/2012    Procedure: HOT HEMOSTASIS (ARGON PLASMA COAGULATION/BICAP);  Surgeon:  Hart Carwin, MD;  Location: [facility];  Service: Endoscopy;  Laterality: N/A;  . Esophagogastroduodenoscopy N/A 01/18/2013    Procedure: ESOPHAGOGASTRODUODENOSCOPY (EGD);  Surgeon: Hart Carwin, MD;  Location[facility];  Service: Endoscopy;  Laterality: N/A;   Social History:  reports that she quit smoking about 20 years ago. Her smoking use included Cigarettes. She has a 37.5 pack-year smoking history. She has never used smokeless tobacco. She reports that  drinks alcohol. She  reports that she does not use illicit drugs. Lives at home. where does patient live-- Can do ADLs. Can patient participate in ADLs?  Allergies  Allergen Reactions  . Aspirin Other (See Comments)    Causes nosebleeds  . Penicillins Itching  . Theophylline Nausea And Vomiting    Family History  Problem Relation Age of Onset  . Heart disease Mother   . Breast cancer Sister   . Cervical cancer Mother   . Diabetes Father   . Colon cancer Neg Hx   . Breast cancer Maternal Aunt     x 2  . Diabetes Father   . Diabetes Sister   . Kidney disease Mother       Prior to Admission medications   Medication Sig Start Date End Date Taking? Authorizing Provider  albuterol (PROAIR HFA) 108 (90 BASE) MCG/ACT inhaler Inhale 2 puffs into the lungs every 6 (six) hours as needed for shortness of breath.    Yes Historical Provider, MD  ALPRAZolam Prudy Feeler) 1 MG tablet Take 1 mg by mouth at bedtime.    Yes Historical Provider, MD  Armodafinil (NUVIGIL) 150 MG tablet Take 150 mg by mouth daily.   Yes Historical Provider, MD  atenolol (TENORMIN) 50 MG tablet Take 50 mg by mouth daily.   Yes Historical Provider, MD  b complex vitamins tablet Take 1 tablet by mouth daily.    Yes Historical Provider, MD  B-D ULTRAFINE III SHORT PEN 31G X 8 MM MISC USE AS DIRECTED 01/16/13  Yes Michele Mcalpine, MD  buPROPion (WELLBUTRIN XL) 150 MG 24 hr tablet Take 150 mg by mouth daily.   Yes Historical Provider, MD  carbidopa-levodopa (SINEMET CR) 25-100 MG per tablet Take 1 tablet by mouth 2 (two) times daily. Take with lunch and dinner   Yes Historical Provider, MD  desvenlafaxine (PRISTIQ) 100 MG 24 hr tablet Take 100 mg by mouth daily.   Yes Historical Provider, MD  dextromethorphan-guaiFENesin (MUCINEX DM) 30-600 MG per 12 hr tablet Take 1-2 tablets by mouth every 12 (twelve) hours.    Yes Historical Provider, MD  Fluticasone-Salmeterol (ADVAIR DISKUS) 250-50 MCG/DOSE AEPB Inhale 1 puff into the lungs 2 (two) times daily.  01/26/13  Yes Michele Mcalpine, MD  furosemide (LASIX) 40 MG tablet Take 1 tablet (40 mg total) by mouth daily. 10/04/12  Yes Michele Mcalpine, MD  glimepiride (AMARYL) 4 MG tablet Take 4 mg by mouth daily before breakfast.   Yes Historical Provider, MD  insulin glargine (LANTUS) 100 UNIT/ML injection Inject 50 Units into the skin daily.    Yes Historical Provider, MD  metFORMIN (GLUCOPHAGE) 500 MG tablet Take 500 mg by mouth 2 (two) times daily with a meal.   Yes Historical Provider, MD  Multiple Vitamins-Minerals (CENTRUM SILVER PO) Take 1 tablet by mouth daily.  11/22/11  Yes Historical Provider, MD  pantoprazole (PROTONIX) 40 MG tablet Take 40 mg by mouth daily.   Yes Historical Provider, MD  polyethylene glycol powder (GLYCOLAX/MIRALAX) powder Take 17 g by  mouth daily as needed (for constipation).  06/05/12  Yes Hart Carwin, MD  potassium chloride SA (K-DUR,KLOR-CON) 20 MEQ tablet Take 20 mEq by mouth daily. 11/23/11  Yes Historical Provider, MD  QUEtiapine (SEROQUEL) 100 MG tablet Take 400 mg by mouth at bedtime.  11/22/11  Yes Historical Provider, MD  rOPINIRole (REQUIP) 4 MG tablet Take 4 mg by mouth at bedtime.   Yes Historical Provider, MD  Simethicone (GAS-X PO) Take 1 tablet by mouth daily. Takes after the biggest meal of the day   Yes Historical Provider, MD  simvastatin (ZOCOR) 40 MG tablet Take 40 mg by mouth at bedtime.   Yes Historical Provider, MD  sitaGLIPtin (JANUVIA) 100 MG tablet Take 100 mg by mouth daily.   Yes Historical Provider, MD  sodium chloride (OCEAN) 0.65 % SOLN nasal spray Place 1 spray into the nose as needed for congestion. 01/19/13  Yes Renae Fickle, MD  Vitamin D, Ergocalciferol, (DRISDOL) 50000 UNITS CAPS Take 1 capsule (50,000 Units total) by mouth every 7 (seven) days. Take on friday 02/24/12  Yes Michele Mcalpine, MD   Physical Exam: Filed Vitals:   02/01/13 2130 02/01/13 2145 02/01/13 2200 02/01/13 2255  BP: 151/61  145/52 132/56  Pulse: 59 60 60 77  Temp:    98.5  F (36.9 C)  Resp: 13 14 13 14   Height:    5\' 4"  (1.626 m)  Weight:    99.9 kg (220 lb 3.8 oz)  SpO2: 97% 98% 99% 94%     General:  Well-developed and nourished.  Eyes: Anicteric no pallor.  ENT: No discharge from ears eyes nose mouth.  Neck: No mass felt.  Cardiovascular: S1-S2 heard.  Respiratory: No rhonchi or crepitations.  Abdomen: Soft nontender bowel sounds present.  Skin: No rash.  Musculoskeletal: No edema.  Psychiatric: Appears normal.  Neurologic: Alert awake oriented to time place and person. Moves all extremities.  Labs on Admission:  Basic Metabolic Panel:  Recent Labs Lab 02/01/13 1728  NA 137  K 3.8  CL 100  CO2 26  GLUCOSE 228*  BUN 24*  CREATININE 0.92  CALCIUM 9.7   Liver Function Tests:  Recent Labs Lab 02/01/13 1728  AST 39*  ALT 16  ALKPHOS 120*  BILITOT 0.7  PROT 6.4  ALBUMIN 3.1*    Recent Labs Lab 02/01/13 1728  LIPASE 53   No results found for this basename: AMMONIA,  in the last 168 hours CBC:  Recent Labs Lab 01/29/13 1450 02/01/13 1728  WBC 4.4* 3.5*  NEUTROABS 3.5 2.6  HGB 8.8 Repeated and verified X2.* 8.7*  HCT 25.8 Repeated and verified X2.* 27.3*  MCV 90.8 93.5  PLT 99.0* 81*   Cardiac Enzymes: No results found for this basename: CKTOTAL, CKMB, CKMBINDEX, TROPONINI,  in the last 168 hours  BNP (last 3 results)  Recent Labs  10/15/12 1349 11/12/12 1107 01/18/13 0027  PROBNP 164.0* 180.0* 167.9*   CBG:  Recent Labs Lab 02/01/13 1919 02/01/13 2211  GLUCAP 149* 92    Radiological Exams on Admission: Dg Chest 2 View  02/01/2013   *RADIOLOGY REPORT*  Clinical Data: Shortness of breath, cough, COPD, asthma  CHEST - 2 VIEW  Comparison: 10/15/2012  Findings: Chronic interstitial markings.  Mild left upper lobe scarring.  No focal consolidation. No pleural effusion or pneumothorax.  The heart is top normal in size.  Mild degenerative changes of the visualized thoracolumbar spine.  IMPRESSION:  No evidence of acute cardiopulmonary disease.  Original Report Authenticated By: Charline Bills, M.D.     Assessment/Plan Principal Problem:   Nausea & vomiting Active Problems:   IRON DEFICIENCY ANEMIA SECONDARY TO BLOOD LOSS   HYPERTENSION   AORTIC STENOSIS   COPD   ANGIODYSPLASIA, INTESTINE, WITHOUT HEMORRHAGE   Other pancytopenia   UTI (lower urinary tract infection)   Nausea and vomiting   Weakness   1. Nausea vomiting - most likely probably from UTI. Could also be from gastroparesis from diabetes mellitus. Check KUB and sonogram of the abdomen. Advance diet as tolerated. 2. UTI - on IV antibiotics. Check urine culture. 3. Chronic GI bleed with history of angiodysplasia of the intestine and cirrhosis of liver with varices - has had recent EGD with no active bleed. Had undergone laser ablation. Closely follow CBC. Further recommendations per GI. 4. 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 Continue Lasix for now. 5. COPD on home oxygen - presently not wheezing. 6. Pancytopenia - closely follow CBC. 7. History of cirrhosis of liver - follow by GI. 8. Hypertension - continue present medications.    Code Status: Full code  Family Communication: None.  Disposition Plan: Admit to inpatient.    KAKRAKANDY,ARSHAD N. [facility] Pager [phone].  If 7PM-7AM, please contact night-coverage [email or web address] Password [facility] 02/01/2013, 11:38 PM

## 2013-02-01 NOTE — ED Provider Notes (Signed)
History     CSN: [id number]  Arrival date & time 02/01/13  1701   First MD Initiated Contact with Patient 02/01/13 1719      Chief Complaint  Patient presents with  . Dehydration    (Consider location/radiation/quality/duration/timing/severity/associated sxs/prior treatment) HPI Comments: Patient with extensive past medical history presents to the ED for nausea, vomiting, dehydration, and weakness.  States she has been able to hold down her food but not liquids. She has tried eating ice that she vomits this back up and describes it as "hot water mixed with mucus." Patient recently admitted to the hospital for anemia secondary to GI bleed requiring blood transfusion. Patient has a history of chronic anemia and is followed by GI, Dr. Juanda Chance. She is to undergo capsule endoscopy on Wednesday 02/03/13.  Patient states over the past 3 days she has noticed increased blood in her stools. He states it is more evident when she wipes.  Blood count completed last Friday which was 8.8.  Patient also endorses recent cough. She is on 4L O2 via nasal cannula at all times.  Patient denies any chest pain, palpitations, dizziness, visual disturbances, abdominal pain, or diarrhea.  The history is provided by the patient.    Past Medical History  Diagnosis Date  . Chronic airway obstruction, not elsewhere classified   . Unspecified chronic bronchitis   . Unspecified essential hypertension   . Coronary atherosclerosis of unspecified type of vessel, native or graft   . Palpitations   . Aortic valve disorders   . Pure hypercholesterolemia   . Morbid obesity   . Esophageal reflux   . Angiodysplasia of intestine (without mention of hemorrhage)   . Diverticulosis of colon (without mention of hemorrhage)   . Benign neoplasm of colon   . Autoimmune hepatitis   . Cystitis, unspecified   . Osteoarthrosis, unspecified whether generalized or localized, unspecified site   . Osteoporosis, unspecified   . Restless  legs syndrome (RLS)   . Major depressive disorder, recurrent episode, severe, specified as with psychotic behavior   . Iron deficiency anemia secondary to blood loss (chronic)   . Coarse tremors     "just in my arms"  . Carpal tunnel syndrome on both sides   . Dysrhythmia     palpitations  . CHF (congestive heart failure)   . Asthma   . Shortness of breath     "any time really"  . Sleep apnea     "but I don't use the equipment"  . Type II diabetes mellitus   . Blood transfusion   . H/O hiatal hernia   . 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 Adenomatous colon polyp     Past Surgical History  Procedure Laterality Date  . Hemorroidectomy    . Appendectomy    . Vaginal hysterectomy    . Breast biopsy      bilateral  . Cesarean section      x 3  . Appendectomy    . Esophagogastroduodenoscopy  06/12/2012    Procedure: ESOPHAGOGASTRODUODENOSCOPY (EGD);  Surgeon: Hart Carwin, MD;  Location: [facility];  Service: Endoscopy;  Laterality: N/A;  . Balloon dilation  06/12/2012    Procedure: BALLOON DILATION;  Surgeon: Hart Carwin, MD;  Location: [facility];  Service: Endoscopy;  Laterality: N/A;  ?balloon  . Esophagogastroduodenoscopy  09/10/2012    Procedure: ESOPHAGOGASTRODUODENOSCOPY (EGD);  Surgeon: Hart Carwin, MD;  Location: [facility];  Service: Endoscopy;  Laterality: N/A;  .  Hot hemostasis  09/10/2012    Procedure: HOT HEMOSTASIS (ARGON PLASMA COAGULATION/BICAP);  Surgeon: Hart Carwin, MD;  Location: [facility];  Service: Endoscopy;  Laterality: N/A;  . Esophagogastroduodenoscopy N/A 01/18/2013    Procedure: ESOPHAGOGASTRODUODENOSCOPY (EGD);  Surgeon: Hart Carwin, MD;  Location: [facility]COPY;  Service: Endoscopy;  Laterality: N/A;    Family History  Problem Relation Age of Onset  . Heart disease Mother   . Breast cancer Sister   . Cervical cancer Mother   . Diabetes Father   . Colon cancer Neg Hx   . Breast cancer Maternal Aunt     x 2  . Diabetes Father    . Diabetes Sister   . Kidney disease Mother     History  Substance Use Topics  . Smoking status: Former Smoker -- 1.50 packs/day for 25 years    Types: Cigarettes    Quit date: 08/26/1992  . Smokeless tobacco: Never Used  . Alcohol Use: Yes     Comment: 11/22/11 "last alcoholic drink was years ago"    OB History   Grav Para Term Preterm Abortions TAB SAB Ect Mult Living                  Review of Systems  Gastrointestinal: Positive for nausea and vomiting.  Neurological: Positive for weakness.  All other systems reviewed and are negative.    Allergies  Aspirin; Penicillins; and Theophylline  Home Medications   Current Outpatient Rx  Name  Route  Sig  Dispense  Refill  . albuterol (PROAIR HFA) 108 (90 BASE) MCG/ACT inhaler   Inhalation   Inhale 2 puffs into the lungs every 6 (six) hours as needed for shortness of breath.          . ALPRAZolam (XANAX) 1 MG tablet   Oral   Take 1 mg by mouth at bedtime.          . Armodafinil (NUVIGIL) 150 MG tablet   Oral   Take 150 mg by mouth daily.         [name] atenolol (TENORMIN) 50 MG tablet   Oral   Take 50 mg by mouth daily.         [name] b complex vitamins tablet   Oral   Take 1 tablet by mouth daily.          . B-D ULTRAFINE III SHORT PEN 31G X 8 MM MISC      USE AS DIRECTED   100 each   3   . buPROPion (WELLBUTRIN XL) 150 MG 24 hr tablet   Oral   Take 150 mg by mouth daily.         . carbidopa-levodopa (SINEMET CR) 25-100 MG per tablet   Oral   Take 1 tablet by mouth 2 (two) times daily. Take with lunch and dinner         . desvenlafaxine (PRISTIQ) 100 MG 24 hr tablet   Oral   Take 100 mg by mouth daily.         [name] dextromethorphan-guaiFENesin (MUCINEX DM) 30-600 MG per 12 hr tablet   Oral   Take 1-2 tablets by mouth every 12 (twelve) hours.          . Fluticasone-Salmeterol (ADVAIR DISKUS) 250-50 MCG/DOSE AEPB   Inhalation   Inhale 1 puff into the lungs 2 (two) times daily.   1 each    0   . furosemide (LASIX) 40 MG tablet   Oral  Take 1 tablet (40 mg total) by mouth daily.   30 tablet   6   . glimepiride (AMARYL) 4 MG tablet   Oral   Take 4 mg by mouth daily before breakfast.         . insulin glargine (LANTUS) 100 UNIT/ML injection   Subcutaneous   Inject 50 Units into the skin daily.          . metFORMIN (GLUCOPHAGE) 500 MG tablet   Oral   Take 500 mg by mouth 2 (two) times daily with a meal.         . Multiple Vitamins-Minerals (CENTRUM SILVER PO)   Oral   Take 1 tablet by mouth daily.          . pantoprazole (PROTONIX) 40 MG tablet   Oral   Take 40 mg by mouth daily.         . polyethylene glycol powder (GLYCOLAX/MIRALAX) powder   Oral   Take 17 g by mouth daily as needed (for constipation).          . potassium chloride SA (K-DUR,KLOR-CON) 20 MEQ tablet   Oral   Take 20 mEq by mouth daily.         . promethazine (PHENERGAN) 25 MG tablet   Oral   Take 1 tablet (25 mg total) by mouth every 6 (six) hours as needed.   30 tablet   6   . QUEtiapine (SEROQUEL) 100 MG tablet   Oral   Take 400 mg by mouth at bedtime.          [name] rOPINIRole (REQUIP) 4 MG tablet   Oral   Take 4 mg by mouth at bedtime.         . Simethicone (GAS-X PO)   Oral   Take 1 tablet by mouth daily. Takes after the biggest meal of the day         . simvastatin (ZOCOR) 40 MG tablet   Oral   Take 40 mg by mouth at bedtime.         . sitaGLIPtin (JANUVIA) 100 MG tablet   Oral   Take 100 mg by mouth daily.         . sodium chloride (OCEAN) 0.65 % SOLN nasal spray   Nasal   Place 1 spray into the nose as needed for congestion.      0   . Vitamin D, Ergocalciferol, (DRISDOL) 50000 UNITS CAPS   Oral   Take 1 capsule (50,000 Units total) by mouth every 7 (seven) days. Take on friday   5 capsule   11     BP 136/75  Pulse 70  Temp(Src) 99.5 F (37.5 C)  Resp 22  SpO2 95%  Physical Exam  Nursing note and vitals  reviewed. Constitutional: She is oriented to person, place, and time. She appears well-developed and well-nourished.  HENT:  Head: Normocephalic and atraumatic.  Mouth/Throat: Oropharynx is clear and moist.  Mildly dry mucus membranes  Eyes: Conjunctivae and EOM are normal.  Neck: Normal range of motion. Neck supple.  Cardiovascular: Normal rate and regular rhythm.   Murmur heard. Pulmonary/Chest: Effort normal and breath sounds normal. No respiratory distress. She has no wheezes.  Abdominal: Soft. Bowel sounds are normal. She exhibits no distension. There is no tenderness. There is no guarding, no CVA tenderness, no tenderness at McBurney's point and negative Murphy's sign.  Musculoskeletal: Normal range of motion. She exhibits no edema.  Neurological: She is alert and oriented  to person, place, and time. She has normal strength. No cranial nerve deficit or sensory deficit.  CN grossly intact, moves all extremities appropriately, no acute neuro deficits or facial droop appreciated  Skin: Skin is warm and dry.  Psychiatric: She has a normal mood and affect.    ED Course  Procedures (including critical care time)   Date: 02/01/2013  Rate: 68  Rhythm: normal sinus rhythm  QRS Axis: normal  Intervals: PR prolonged  ST/T Wave abnormalities: normal  Conduction Disutrbances:first-degree A-V block   Narrative Interpretation: no STEMI  Old EKG Reviewed: unchanged    Labs Reviewed  CBC WITH DIFFERENTIAL - Abnormal; Notable for the following:    WBC 3.5 (*)    RBC 2.92 (*)    Hemoglobin 8.7 (*)    HCT 27.3 (*)    RDW 17.4 (*)    Platelets 81 (*)    Lymphocytes Relative 11 (*)    Lymphs Abs 0.4 (*)    All other components within normal limits  COMPREHENSIVE METABOLIC PANEL  URINALYSIS, ROUTINE W REFLEX MICROSCOPIC  POCT I-STAT TROPONIN I  TYPE AND SCREEN   No results found.   1. UTI (lower urinary tract infection)   2. Nausea and vomiting   3. Weakness   4. Aortic valve  disorders   5. Nausea & vomiting   6. Type II or unspecified type diabetes mellitus without mention of complication, not stated as uncontrolled   7. Anemia   8. Chronic airway obstruction, not elsewhere classified       MDM   EKG NSR, no acute ischemic changes.  Trop negative.  Labs as above.  H/H stable when compared with recent 3 days ago.  Stool remains guaiac +.  U/a consistent with UTI.  Rocephin started in the ED.  She has had no active vomiting in the ED, NAD, VS remained stable.  Pt will need admission for further evaluation of her sx.  Consult pending, signed out to Earley Favor, NP for disposition and temp admit orders.       Garlon Hatchet, PA-C 02/03/13 1054

## 2013-02-01 NOTE — ED Provider Notes (Signed)
  Physical Exam  BP 154/62  Pulse 60  Temp(Src) 99.5 F (37.5 C)  Resp 13  SpO2 98%  Physical Exam Patient to be admitted for UTI, global weakness.  Awaiting hospitalist return call ED Course  Procedures  MDM  Attention to be admitted for UTI to a telemetry floor.  Hospitalist ask that a lipase be added to her labs and temporary.  Admission.  Orders be written      Arman Filter, NP 02/01/13 2148

## 2013-02-01 NOTE — Progress Notes (Signed)
PT has arrived from the ED via stretcher. Pt is in no apparent distress. Pt was placed in bed. Safety video has been turned on.  RN paged admission doctor. MD is at the bedside.

## 2013-02-01 NOTE — ED Notes (Signed)
Per pt sts nausea, vomiting, dehydration since yesterday. sts weakness.

## 2013-02-02 ENCOUNTER — Inpatient Hospital Stay (HOSPITAL_COMMUNITY): Payer: Medicare Other

## 2013-02-02 DIAGNOSIS — D649 Anemia, unspecified: Secondary | ICD-10-CM

## 2013-02-02 DIAGNOSIS — J449 Chronic obstructive pulmonary disease, unspecified: Secondary | ICD-10-CM

## 2013-02-02 LAB — CBC WITH DIFFERENTIAL/PLATELET
Basophils Absolute: 0 10*3/uL (ref 0.0–0.1)
Basophils Relative: 0 % (ref 0–1)
Eosinophils Absolute: 0.1 10*3/uL (ref 0.0–0.7)
Eosinophils Relative: 3 % (ref 0–5)
MCH: 29.8 pg (ref 26.0–34.0)
MCHC: 31.4 g/dL (ref 30.0–36.0)
MCV: 94.8 fL (ref 78.0–100.0)
Neutrophils Relative %: 72 % (ref 43–77)
Platelets: 77 10*3/uL — ABNORMAL LOW (ref 150–400)
RDW: 17.8 % — ABNORMAL HIGH (ref 11.5–15.5)

## 2013-02-02 LAB — COMPREHENSIVE METABOLIC PANEL
AST: 35 U/L (ref 0–37)
Albumin: 2.7 g/dL — ABNORMAL LOW (ref 3.5–5.2)
Alkaline Phosphatase: 103 U/L (ref 39–117)
CO2: 27 mEq/L (ref 19–32)
Chloride: 106 mEq/L (ref 96–112)
Creatinine, Ser: 0.98 mg/dL (ref 0.50–1.10)
GFR calc non Af Amer: 58 mL/min — ABNORMAL LOW (ref >90)
Potassium: 5.2 mEq/L — ABNORMAL HIGH (ref 3.5–5.1)
Total Bilirubin: 0.5 mg/dL (ref 0.3–1.2)

## 2013-02-02 LAB — GLUCOSE, CAPILLARY
Glucose-Capillary: 157 mg/dL — ABNORMAL HIGH (ref 70–99)
Glucose-Capillary: 341 mg/dL — ABNORMAL HIGH (ref 70–99)
Glucose-Capillary: 92 mg/dL (ref 70–99)

## 2013-02-02 LAB — CBC
MCV: 93.8 fL (ref 78.0–100.0)
Platelets: 88 10*3/uL — ABNORMAL LOW (ref 150–400)
RBC: 2.74 MIL/uL — ABNORMAL LOW (ref 3.87–5.11)
RDW: 17.6 % — ABNORMAL HIGH (ref 11.5–15.5)
WBC: 4.1 10*3/uL (ref 4.0–10.5)

## 2013-02-02 LAB — LIPASE, BLOOD: Lipase: 50 U/L (ref 11–59)

## 2013-02-02 MED ORDER — POLYETHYLENE GLYCOL 3350 17 GM/SCOOP PO POWD
0.5000 | Freq: Once | ORAL | Status: AC
  Administered 2013-02-02: 0.5 via ORAL
  Filled 2013-02-02: qty 255

## 2013-02-02 MED ORDER — PROMETHAZINE HCL 12.5 MG PO TABS: ORAL_TABLET | ORAL | Status: DC

## 2013-02-02 NOTE — Evaluation (Signed)
Physical Therapy Evaluation Patient Details Name: [name] MRN: [id number] DOB: 13-Aug-1941 Today's Date: 02/02/2013 Time: 3086-5784 PT Time Calculation (min): 12 min  PT Assessment / Plan / Recommendation Clinical Impression  Pt at baseline with mobility.  At baseline pt limited by SOB to short amb distances.  No further PT recommended.    PT Assessment  Patent does not need any further PT services    Follow Up Recommendations  No PT follow up    Does the patient have the potential to tolerate intense rehabilitation      Barriers to Discharge        Equipment Recommendations  None recommended by PT    Recommendations for Other Services     Frequency      Precautions / Restrictions Precautions Precautions: Fall   Pertinent Vitals/Pain N/A      Mobility  Bed Mobility Bed Mobility: Supine to Sit;Sitting - Scoot to Edge of Bed;Sit to Supine Supine to Sit: 6: Modified independent (Device/Increase time);HOB elevated Sitting - Scoot to Edge of Bed: 6: Modified independent (Device/Increase time) Sit to Supine: 6: Modified independent (Device/Increase time);HOB elevated Transfers Transfers: Sit to Stand;Stand to Sit Sit to Stand: 6: Modified independent (Device/Increase time);With upper extremity assist;From bed;From toilet Stand to Sit: 6: Modified independent (Device/Increase time);With upper extremity assist;To bed;To toilet Ambulation/Gait Ambulation/Gait Assistance: 5: Supervision (for lines only) Ambulation Distance (Feet): 25 Feet Assistive device: Rolling walker Gait Pattern: Step-through pattern;Decreased stride length;Trunk flexed Gait velocity: decr    Exercises     PT Diagnosis:    PT Problem List:   PT Treatment Interventions:     PT Goals    Visit Information  Last PT Received On: 02/02/13 Assistance Needed: +1    Subjective Data  Subjective: Pt states she just go a walker on Friday. Patient Stated Goal: Return home   Prior  Functioning  Home Living Lives With: Spouse Available Help at Discharge: Family Type of Home: House Home Access: Stairs to enter Secretary/administrator of Steps: 3 Entrance Stairs-Rails: Right Home Layout: One level Home Adaptive Equipment: Environmental consultant - four wheeled Additional Comments: Home O2 Prior Function Level of Independence: Needs assistance Needs Assistance: Meal Prep;Light Housekeeping Meal Prep: Total Light Housekeeping: Total Vocation: Retired Comments: Amb household distances. Communication Communication: No difficulties    Cognition  Cognition Arousal/Alertness: Awake/alert Behavior During Therapy: WFL for tasks assessed/performed Overall Cognitive Status: Within Functional Limits for tasks assessed    Extremity/Trunk Assessment Right Upper Extremity Assessment RUE ROM/Strength/Tone: [facility] for tasks assessed Left Upper Extremity Assessment LUE ROM/Strength/Tone: WFL for tasks assessed Right Lower Extremity Assessment RLE ROM/Strength/Tone: [facility] for tasks assessed Left Lower Extremity Assessment LLE ROM/Strength/Tone: WFL for tasks assessed   Balance Balance Balance Assessed: Yes Static Standing Balance Static Standing - Balance Support: No upper extremity supported;During functional activity Static Standing - Level of Assistance: 6: Modified independent (Device/Increase time)  End of Session PT - End of Session Activity Tolerance: Other (comment) (Limited by SOB which is baseline limitation) Patient left: in bed;with call bell/phone within reach Nurse Communication: Mobility status  GP     Teodor Prater 02/02/2013, 3:02 PM  [employer] PT [phone]

## 2013-02-02 NOTE — Telephone Encounter (Signed)
Left a message for patient to call me. 

## 2013-02-02 NOTE — Care Management Note (Signed)
    Page 1 of 1   02/03/2013     10:21:36 AM   CARE MANAGEMENT NOTE 02/03/2013  Patient:  [name], [name]   Account Number:  [id number]  Date Initiated:  02/02/2013  Documentation initiated by:  Letha Cape  Subjective/Objective Assessment:   dx n/v, hx chronic gib, avm's  admit- lives with spouse.     Action/Plan:   pt eval- no pt f/u needed.   Anticipated DC Date:  02/03/2013   Anticipated DC Plan:  HOME/SELF CARE      DC Planning Services  CM consult      Choice offered to / List presented to:             Status of service:  Completed, signed off Medicare Important Message given?   (If response is "NO", the following Medicare IM given date fields will be blank) Date Medicare IM given:   Date Additional Medicare IM given:    Discharge Disposition:  HOME/SELF CARE  Per UR Regulation:  Reviewed for med. necessity/level of care/duration of stay  If discussed at Long Length of Stay Meetings, dates discussed:    Comments:  02/03/13 10:20 Letha Cape RN, BSN [phone] patient is for dc today, per physical therapy no pt f/u needed.  02/02/13 10:59 Letha Cape RN, BSN [phone] patient lives with spouse, await pt eval.  NCM will continue to follow for dc needs.

## 2013-02-02 NOTE — Progress Notes (Signed)
UR COMPLETED  

## 2013-02-02 NOTE — ED Provider Notes (Signed)
Medical screening examination/treatment/procedure(s) were performed by non-physician practitioner and as supervising physician I was immediately available for consultation/collaboration.  Ariah Mower T Aviel Davalos, MD 02/02/13 2010 

## 2013-02-02 NOTE — Progress Notes (Signed)
PATIENT DETAILS Name: [name] Age: 72 y.o. Sex: female Date of Birth: 20-Jan-1941 Admit Date: 02/01/2013 Admitting Physician Eduard Clos, MD ZOX:[id number],VWUJW M, MD  Subjective: No further vomiting. Her last vomiting episode was yesterday evening.  Assessment/Plan: Principal Problem:   Nausea & vomiting - Seems to have resolved. - Etiology remains unclear. However her abdomen is very soft and nontender. X-ray of the abdomen did not show any evidence of bowel obstruction, it did show a significant amount of stool in her colon. Lipase levels are normal as well. Perhaps it is related to either underlying UTI or constipation. - Advanced diet to full liquids and then to regular diet as tolerated. - If she is able to tolerate diet by the end of the day, she can be discharged home.  Active Problems: UTI - Continue with IV ciprofloxacin - Await urine cultures. - He is afebrile and without leukocytosis.  Chronic GI bleed - Secondary to angiodysplasia of the intestines - She chronically has black stools - Continue with PPI - Hemoglobin slightly decreased from admission, likely secondary to hemodilution. Otherwise very close to her usual baseline. - She was due for a capsule endoscopy tomorrow, given that she is slowly getting over this acute illness, this has now been rescheduled for 6/16  Anemia - Secondary to chronic blood loss- from above - Monitor H&H periodically - She seems to be very close to her usual baseline. - Transfuse if less than 7 or if she develops active GI bleeding  Constipation - She is getting a bowel prep - Follow clinically  COPD - Stable without any wheezing - She is on chronic oxygen at home - Continue with albuterol inhaler as needed, continue the  Chronic hypoxic respiratory failure - Secondary to above - She is on chronic oxygen at home  Chronic diastolic heart failure - Clinically compensated - Continue with Lasix. Continue with  atenolol.  Chronic tremors - On Sinemet and Requip-continue  Hypertension - Controlled with atenolol  Diabetes - CBGs stable - Continue with SSI - Since she does have a patient being discharged soon, will continue with her usual oral hypoglycemic agents as well.  Thrombocytopenia - This seems to be a chronic issue, monitor platelets periodically.  Disposition: Remain inpatient  DVT Prophylaxis:  SCD's  Code Status: Full code   Family Communication None at bedside  Procedures:  None  CONSULTS:  None   MEDICATIONS: Scheduled Meds: . ALPRAZolam  1 mg Oral QHS  . Armodafinil  150 mg Oral q morning - 10a  . atenolol  50 mg Oral Daily  . buPROPion  150 mg Oral Daily  . carbidopa-levodopa  1 tablet Oral BID  . ciprofloxacin  400 mg Intravenous Q12H  . desvenlafaxine  100 mg Oral Daily  . dextromethorphan-guaiFENesin  1-2 tablet Oral Q12H  . furosemide  40 mg Oral Daily  . glimepiride  4 mg Oral QAC breakfast  . insulin aspart  0-9 Units Subcutaneous TID WC  . insulin glargine  40 Units Subcutaneous Daily  . linagliptin  5 mg Oral Daily  . mometasone-formoterol  2 puff Inhalation BID  . pantoprazole  40 mg Oral Daily  . polyethylene glycol powder  0.5 Container Oral Once  . QUEtiapine  400 mg Oral QHS  . rOPINIRole  4 mg Oral QHS  . simvastatin  40 mg Oral QHS  . sodium chloride  3 mL Intravenous Q12H   Continuous Infusions: . sodium chloride 20 mL/hr (02/02/13 0055)   PRN  Meds:.acetaminophen, acetaminophen, albuterol, ondansetron (ZOFRAN) IV, ondansetron, sodium chloride  Antibiotics: Anti-infectives   Start     Dose/Rate Route Frequency Ordered Stop   02/02/13 0000  ciprofloxacin (CIPRO) IVPB 400 mg     400 mg 200 mL/hr over 60 Minutes Intravenous Every 12 hours 02/01/13 2348     02/01/13 2000  cefTRIAXone (ROCEPHIN) 1 g in dextrose 5 % 50 mL IVPB     1 g 100 mL/hr over 30 Minutes Intravenous  Once 02/01/13 1957 02/01/13 2034       PHYSICAL  EXAM: Vital signs in last 24 hours: Filed Vitals:   02/01/13 2200 02/01/13 2255 02/02/13 0526 02/02/13 1100  BP: 145/52 132/56 111/64 118/57  Pulse: 60 77 56 62  Temp:  98.5 F (36.9 C) 98.2 F (36.8 C)   TempSrc:   Oral   Resp: 13 14 15    Height:  5\' 4"  (1.626 m)    Weight:  99.9 kg (220 lb 3.8 oz) 102.9 kg (226 lb 13.7 oz)   SpO2: 99% 94% 99%     Weight change:  Filed Weights   02/01/13 2255 02/02/13 0526  Weight: 99.9 kg (220 lb 3.8 oz) 102.9 kg (226 lb 13.7 oz)   Body mass index is 38.92 kg/(m^2).   Gen Exam: Awake and alert with clear speech.   Neck: Supple, No JVD.   Chest: B/L Clear.   CVS: S1 S2 Regular, no murmurs.  Abdomen: soft, BS +, non tender, non distended.  Extremities: no edema, lower extremities warm to touch. Neurologic: Non Focal.   Skin: No Rash.   Wounds: N/A.   Intake/Output from previous day:  Intake/Output Summary (Last 24 hours) at 02/02/13 1133 Last data filed at 02/02/13 0641  Gross per 24 hour  Intake 435.33 ml  Output      0 ml  Net 435.33 ml     LAB RESULTS: CBC  Recent Labs Lab 01/29/13 1450 02/01/13 1728 02/02/13 0023 02/02/13 0518  WBC 4.4* 3.5* 4.1 3.1*  HGB 8.8 Repeated and verified X2.* 8.7* 8.1* 7.5*  HCT 25.8 Repeated and verified X2.* 27.3* 25.7* 23.9*  PLT 99.0* 81* 88* 77*  MCV 90.8 93.5 93.8 94.8  MCH  --  29.8 29.6 29.8  MCHC 34.0 31.9 31.5 31.4  RDW 17.8* 17.4* 17.6* 17.8*  LYMPHSABS 0.4* 0.4*  --  0.4*  MONOABS 0.4 0.3  --  0.3  EOSABS 0.1 0.1  --  0.1  BASOSABS 0.0 0.0  --  0.0    Chemistries   Recent Labs Lab 02/01/13 1728 02/02/13 0518  NA 137 140  K 3.8 5.2*  CL 100 106  CO2 26 27  GLUCOSE 228* 194*  BUN 24* 20  CREATININE 0.92 0.98  CALCIUM 9.7 8.7    CBG:  Recent Labs Lab 02/01/13 1919 02/01/13 2211 02/02/13 0054 02/02/13 0736  GLUCAP 149* 92 92 150*    GFR Estimated Creatinine Clearance: 63.3 ml/min (by C-G formula based on Cr of 0.98).  Coagulation profile No results  found for this basename: INR, PROTIME,  in the last 168 hours  Cardiac Enzymes No results found for this basename: CK, CKMB, TROPONINI, MYOGLOBIN,  in the last 168 hours  No components found with this basename: POCBNP,  No results found for this basename: DDIMER,  in the last 72 hours No results found for this basename: HGBA1C,  in the last 72 hours No results found for this basename: CHOL, HDL, LDLCALC, TRIG, CHOLHDL, LDLDIRECT,  in the last  72 hours No results found for this basename: TSH, T4TOTAL, FREET3, T3FREE, THYROIDAB,  in the last 72 hours No results found for this basename: VITAMINB12, FOLATE, FERRITIN, TIBC, IRON, RETICCTPCT,  in the last 72 hours  Recent Labs  02/01/13 1728 02/02/13 0518  LIPASE 53 50    Urine Studies No results found for this basename: UACOL, UAPR, USPG, UPH, UTP, UGL, UKET, UBIL, UHGB, UNIT, UROB, ULEU, UEPI, UWBC, URBC, UBAC, CAST, CRYS, UCOM, BILUA,  in the last 72 hours  MICROBIOLOGY: No results found for this or any previous visit (from the past 240 hour(s)).  RADIOLOGY STUDIES/RESULTS: Dg Chest 2 View  02/01/2013   *RADIOLOGY REPORT*  Clinical Data: Shortness of breath, cough, COPD, asthma  CHEST - 2 VIEW  Comparison: 10/15/2012  Findings: Chronic interstitial markings.  Mild left upper lobe scarring.  No focal consolidation. No pleural effusion or pneumothorax.  The heart is top normal in size.  Mild degenerative changes of the visualized thoracolumbar spine.  IMPRESSION: No evidence of acute cardiopulmonary disease.   Original Report Authenticated By: Charline Bills, M.D.   Dg Abd 1 View  02/02/2013   *RADIOLOGY REPORT*  Clinical Data: Nausea and vomiting.  ABDOMEN - 1 VIEW  Comparison: CT abdomen and pelvis 06/02/2007  Findings: Gas and stool in the colon.  No small or large bowel distension.  No radiopaque stones.  Visualized bones appear intact.  IMPRESSION: Nonobstructive bowel gas pattern.   Original Report Authenticated By: Burman Nieves,  M.D.    Jeoffrey Massed, MD  [facility] Pager:[phone]  If 7PM-7AM, please contact night-coverage [email or web address] Password [email or web address] 02/02/2013, 11:33 AM   LOS: 1 day

## 2013-02-02 NOTE — Telephone Encounter (Signed)
Per chart patient was admitted for UTI.

## 2013-02-02 NOTE — Progress Notes (Signed)
Pt is being taken down to x-ray

## 2013-02-03 DIAGNOSIS — K5901 Slow transit constipation: Secondary | ICD-10-CM | POA: Diagnosis present

## 2013-02-03 LAB — GLUCOSE, CAPILLARY
Glucose-Capillary: 139 mg/dL — ABNORMAL HIGH (ref 70–99)
Glucose-Capillary: 229 mg/dL — ABNORMAL HIGH (ref 70–99)
Glucose-Capillary: 241 mg/dL — ABNORMAL HIGH (ref 70–99)

## 2013-02-03 LAB — URINE CULTURE

## 2013-02-03 LAB — PREPARE RBC (CROSSMATCH)

## 2013-02-03 LAB — CBC
Hemoglobin: 9.7 g/dL — ABNORMAL LOW (ref 12.0–15.0)
MCH: 29.6 pg (ref 26.0–34.0)
Platelets: 99 10*3/uL — ABNORMAL LOW (ref 150–400)
RBC: 3.28 MIL/uL — ABNORMAL LOW (ref 3.87–5.11)
WBC: 4.6 10*3/uL (ref 4.0–10.5)

## 2013-02-03 LAB — BASIC METABOLIC PANEL
Chloride: 105 mEq/L (ref 96–112)
Creatinine, Ser: 0.93 mg/dL (ref 0.50–1.10)
GFR calc Af Amer: 71 mL/min — ABNORMAL LOW (ref >90)
Sodium: 137 mEq/L (ref 135–145)

## 2013-02-03 MED ORDER — POTASSIUM CHLORIDE CRYS ER 20 MEQ PO TBCR: 20.0000 meq | EXTENDED_RELEASE_TABLET | ORAL | Status: DC

## 2013-02-03 MED ORDER — FUROSEMIDE 10 MG/ML IJ SOLN
40.0000 mg | Freq: Once | INTRAMUSCULAR | Status: AC
  Administered 2013-02-03: 40 mg via INTRAVENOUS
  Filled 2013-02-03: qty 4

## 2013-02-03 MED ORDER — ARMODAFINIL 150 MG PO TABS
150.0000 mg | ORAL_TABLET | Freq: Every day | ORAL | Status: DC
  Administered 2013-02-03 – 2013-02-04 (×2): 150 mg via ORAL
  Filled 2013-02-03 (×2): qty 1

## 2013-02-03 MED ORDER — POLYETHYLENE GLYCOL 3350 17 GM/SCOOP PO POWD: 17.0000 g | Freq: Every day | ORAL | Status: DC

## 2013-02-03 MED ORDER — CIPROFLOXACIN HCL 500 MG PO TABS: 500.0000 mg | ORAL_TABLET | Freq: Two times a day (BID) | ORAL | Status: DC

## 2013-02-03 MED ORDER — CIPROFLOXACIN HCL 500 MG PO TABS
500.0000 mg | ORAL_TABLET | Freq: Two times a day (BID) | ORAL | Status: DC
  Administered 2013-02-03 – 2013-02-04 (×2): 500 mg via ORAL
  Filled 2013-02-03 (×4): qty 1

## 2013-02-03 MED ORDER — POTASSIUM CHLORIDE CRYS ER 10 MEQ PO TBCR: 10.0000 meq | EXTENDED_RELEASE_TABLET | ORAL | Status: DC

## 2013-02-03 MED ORDER — DOCUSATE SODIUM 100 MG PO CAPS: 100.0000 mg | ORAL_CAPSULE | Freq: Two times a day (BID) | ORAL | Status: DC

## 2013-02-03 NOTE — Progress Notes (Signed)
Called by primary RN to see pt for c/o blurred vision & chest discomfort.  On arrival pt is sitting on bedside commode & appears very anxious.  Pt states that her chest is feeling better & she isn't sure if her blurred vision is new or not because she has a cataract in that eye.  Pt states she feels like she is having an anxiety attack & this is how she feels when that happens.  Pt is scheduled her nightly xanax & the primary RN is planing to administer it early.

## 2013-02-03 NOTE — Discharge Summary (Addendum)
Physician Discharge Summary  [name] QMV:[id number] DOB: Jul 14, 1941 DOA: 02/01/2013  PCP: [name] Mcalpine, MD  Admit date: 02/01/2013 Discharge date: 02/04/2013  Time spent: 60 minutes  Discharge order was placed 6/11 however, patient was kept overnight in order to finish blood transfusion and because she had a panic attack.  She will be safely discharged to home today 02/04/2013.  Recommendations for Outpatient Follow-up:  1. Patient is scheduled to see her PCP 6/13.  She will need a BMET as her potassium has been abnormal during this admission.    2.  CBC at PCPs office to monitor anemia.  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 female with known history of angiodysplasia of the intestine, cirrhosis of liver with varices, diabetes mellitus type 2, pancytopenia, aortic stenosis, COPD on home oxygen 4 L, presented to the ER because of nausea vomiting. Patient has been having nausea vomiting for last 2 days with weakness and mild shortness of breath. In the ER patient was found to have UA compatible with UTI. Patient has been admitted for further management.  Denies any abdominal pain diarrhea fever chills or any productive cough. Patient experienced GI bleed recently and EGD done showed esophageal varices with portal gastropathy and had AVMs and underwent laser ablation.  Patient is supposed to have outpatient capsule endoscopy. Patient still has ongoing mild rectal bleeding. Guaiac was positive but hemoglobin has been stable  Hospital Course:  Nausea & vomiting  - Seems to have resolved.  - Etiology remains unclear. However her abdomen is very soft and nontender. X-ray of the abdomen did not show any evidence of bowel obstruction, it did show a significant amount of stool in her colon. Lipase levels are normal as well. Perhaps it is related to either underlying UTI or constipation.  - Diet has been  advanced slowly to soft solids.  .  Active Problems:   UTI  - Cultures showed greater than 100,000 colonies e-coli - Treated with IV ciprofloxacin.  Will be progressed to oral Cipro at discharge. - Afebrile and without leukocytosis.   Chronic GI bleed  - Secondary to angiodysplasia of the intestines  - She chronically has black stools  - Continue with PPI  - Hgb steadily declined over admission.  Patient was transfused 2 units of PRBCs on 6/11 prior to D/C.  - She was due for a capsule endoscopy today (6/11), this has now been rescheduled for 6/16   Anemia  - Secondary to chronic blood loss- from above  - Monitor H&H periodically   Constipation  - This likely contributed to her nausea and vomiting. - Xray showed heavy stool burden - received 1/2 a bowel prep resulting in  4 large bowel movements. - Will need to continue to use stool softeners at home. Recommend colace bid  in addition to miralax.  COPD  - Stable without any wheezing  - She is on chronic oxygen at home  - Continue with albuterol inhaler as needed  Chronic hypoxic respiratory failure  - Secondary to above  - She is on chronic oxygen at home   Chronic diastolic heart failure  - Clinically compensated  - Continue with Lasix. Continue with atenolol.   Chronic tremors  - On Sinemet and Requip-continue   Hypertension  - Controlled with atenolol  Diabetes  - CBGs stable  - Since  she does have a patient being discharged soon, will continue with her usual oral hypoglycemic agents as well.   Thrombocytopenia  - This seems to be a chronic issue, monitor platelets periodically.   Procedures:  Blood transfusion.  2 units of PRBCs.     Discharge Exam: Filed Vitals:   02/03/13 1941 02/03/13 2004 02/03/13 2124 02/04/13 0524  BP: 153/78 154/62 164/68 150/66  Pulse: 69 63 66 58  Temp: 98.3 F (36.8 C) 98.3 F (36.8 C) 99.5 F (37.5 C) 98.2 F (36.8 C)  TempSrc: Oral Oral Oral Oral  Resp: 18 18 20 18   Height:      Weight:    101 kg (222 lb 10.6 oz)  SpO2: 99% 99% 97% 100%   Gen Exam: Awake and alert with clear speech. Pale.  Lying in bed, NAD Neck: Supple, No JVD.  Chest: B/L Clear. No w/c/r CVS: S1 S2 Regular, no murmurs.  Abdomen: soft, BS +, non tender, non distended.  Extremities: no edema, lower extremities warm to touch.  Neurologic: Non Focal.  Skin: small rash on left upper back pink/tan in color with small papules. Wounds: N/A.   Discharge Instructions      Discharge Orders   Future Appointments Provider Department Dept Phone   02/05/2013 11:00 AM Romero Belling, MD [facility] [phone]   02/08/2013 8:00 AM [facility] [phone]   02/15/2013 3:30 PM Julio Sicks, NP [facility] [phone]   03/11/2013 3:00 PM Melvyn Novas, MD [facility] [phone]   03/29/2013 11:00 AM [name] Mcalpine, MD [facility] [phone]   Future Orders Complete By Expires     Diet - low sodium heart healthy  As directed     Diet - low sodium heart healthy  As directed     Increase activity slowly  As directed     Increase activity slowly  As directed         Medication List    TAKE these medications       ALPRAZolam 1 MG tablet  Commonly known as:  XANAX  Take 1 mg by mouth at bedtime.     atenolol 50 MG tablet  Commonly known  as:  TENORMIN  Take 50 mg by mouth daily.     b complex vitamins tablet  Take 1 tablet by mouth daily.     B-D ULTRAFINE III SHORT PEN 31G X 8 MM Misc  Generic drug:  Insulin Pen Needle  USE AS DIRECTED     buPROPion 150 MG 24 hr tablet  Commonly known as:  WELLBUTRIN XL  Take 150 mg by mouth daily.     carbidopa-levodopa 25-100 MG per tablet  Commonly known as:  SINEMET CR  Take 1 tablet by mouth 2 (two) times daily. Take with lunch and dinner     CENTRUM SILVER PO  Take 1 tablet by mouth daily.  ciprofloxacin 500 MG tablet  Commonly known as:  CIPRO  Take 1 tablet (500 mg total) by mouth 2 (two) times daily.     desvenlafaxine 100 MG 24 hr tablet  Commonly known as:  PRISTIQ  Take 100 mg by mouth daily.     dextromethorphan-guaiFENesin 30-600 MG per 12 hr tablet  Commonly known as:  MUCINEX DM  Take 1-2 tablets by mouth every 12 (twelve) hours.     docusate sodium 100 MG capsule  Commonly known as:  COLACE  Take 1 capsule (100 mg total) by mouth 2 (two) times daily.     Fluticasone-Salmeterol 250-50 MCG/DOSE Aepb  Commonly known as:  ADVAIR DISKUS  Inhale 1 puff into the lungs 2 (two) times daily.     furosemide 40 MG tablet  Commonly known as:  LASIX  Take 1 tablet (40 mg total) by mouth daily.     GAS-X PO  Take 1 tablet by mouth daily. Takes after the biggest meal of the day     glimepiride 4 MG tablet  Commonly known as:  AMARYL  Take 4 mg by mouth daily before breakfast.     insulin glargine 100 UNIT/ML injection  Commonly known as:  LANTUS  Inject 50 Units into the skin daily.     metFORMIN 500 MG tablet  Commonly known as:  GLUCOPHAGE  Take 500 mg by mouth 2 (two) times daily with a meal.     NUVIGIL 150 MG tablet  Generic drug:  Armodafinil  Take 150 mg by mouth daily.     pantoprazole 40 MG tablet  Commonly known as:  PROTONIX  Take 40 mg by mouth daily.     polyethylene glycol powder powder  Commonly known as:  GLYCOLAX/MIRALAX   Take 17 g by mouth daily.     potassium chloride 10 MEQ tablet  Commonly known as:  K-DUR,KLOR-CON  Take 1 tablet (10 mEq total) by mouth every other day.     PROAIR HFA 108 (90 BASE) MCG/ACT inhaler  Generic drug:  albuterol  Inhale 2 puffs into the lungs every 6 (six) hours as needed for shortness of breath.     promethazine 12.5 MG tablet  Commonly known as:  PHENERGAN  Take one po every 4-6 hours prn nausea     QUEtiapine 100 MG tablet  Commonly known as:  SEROQUEL  Take 400 mg by mouth at bedtime.     rOPINIRole 4 MG tablet  Commonly known as:  REQUIP  Take 4 mg by mouth at bedtime.     simvastatin 40 MG tablet  Commonly known as:  ZOCOR  Take 40 mg by mouth at bedtime.     sitaGLIPtin 100 MG tablet  Commonly known as:  JANUVIA  Take 100 mg by mouth daily.     sodium chloride 0.65 % Soln nasal spray  Commonly known as:  OCEAN  Place 1 spray into the nose as needed for congestion.     Vitamin D (Ergocalciferol) 50000 UNITS Caps  Commonly known as:  DRISDOL  Take 1 capsule (50,000 Units total) by mouth every 7 (seven) days. Take on friday       Allergies  Allergen Reactions  . Aspirin Other (See Comments)    Causes nosebleeds  . Penicillins Itching  . Theophylline Nausea And Vomiting      The results of significant diagnostics from this hospitalization (including imaging, microbiology, ancillary and laboratory) are listed below for reference.    Significant Diagnostic Studies:  Dg Chest 2 View  02/01/2013   *RADIOLOGY REPORT*  Clinical Data: Shortness of breath, cough, COPD, asthma  CHEST - 2 VIEW  Comparison: 10/15/2012  Findings: Chronic interstitial markings.  Mild left upper lobe scarring.  No focal consolidation. No pleural effusion or pneumothorax.  The heart is top normal in size.  Mild degenerative changes of the visualized thoracolumbar spine.  IMPRESSION: No evidence of acute cardiopulmonary disease.   Original Report Authenticated By: Charline Bills, M.D.   Dg Abd 1 View  02/02/2013   *RADIOLOGY REPORT*  Clinical Data: Nausea and vomiting.  ABDOMEN - 1 VIEW  Comparison: CT abdomen and pelvis 06/02/2007  Findings: Gas and stool in the colon.  No small or large bowel distension.  No radiopaque stones.  Visualized bones appear intact.  IMPRESSION: Nonobstructive bowel gas pattern.   Original Report Authenticated By: Burman Nieves, M.D.   US Abdomen Complete  02/02/2013   *RADIOLOGY REPORT*  Clinical Data:  Nausea, vomiting.  Morbid obesity.  History of cirrhosis, diabetes mellitus type 2.  COMPLETE ABDOMINAL ULTRASOUND  Comparison:  Ultrasound the abdomen 09/07/2012  Findings:  Gallbladder:  Gallbladder is distended.  Gallbladder wall is 3.3 mm maximum thickness.  No stones are identified.  No sonographic Murphy's sign.  Common bile duct:  6.2 mm  Liver:  The liver is scalloped, dense, and heterogeneous, consistent with the history of cirrhosis. No focal mass.  IVC:  Appears normal.  Pancreas:  No focal abnormality seen.  Spleen:  Spleen is enlarged, 14.0 cm in cranial caudal length, 939 ml in volume.  A simple cyst is 1.6 x 1.5 x 1.5 cm in the upper region.  Right Kidney:  11.4 cm in length, normal appearance.  Left Kidney:  11.1 cm in length, normal appearance.  Abdominal aorta:  Distal abdominal aorta is obscured by bowel gas. No aortic aneurysm identified.  Maximum diameter, 2.5 cm.  IMPRESSION:  1.  Mildly thickened wall of the gallbladder, a nonspecific finding often associated with cirrhosis.  No other evidence for acute cholecystitis. 2.  Splenomegaly and cirrhosis. 3.  Normal appearance of the kidneys.   Original Report Authenticated By: Norva Pavlov, M.D.    Microbiology: Recent Results (from the past 240 hour(s))  URINE CULTURE     Status: None   Collection Time    02/01/13  6:52 PM      Result Value Range Status   Specimen Description URINE, CLEAN CATCH   Final   Special Requests NONE   Final   Culture  Setup Time  02/01/2013 21:24   Final   Colony Count >=100,000 COLONIES/ML   Final   Culture ESCHERICHIA COLI   Final   Report Status 02/03/2013 FINAL   Final   Organism ID, Bacteria ESCHERICHIA COLI   Final     Labs: Basic Metabolic Panel:  Recent Labs Lab 02/01/13 1728 02/02/13 0518 02/03/13 0726 02/04/13 0850  NA 137 140 137 137  K 3.8 5.2* 5.0 3.9  CL 100 106 105 101  CO2 26 27 28 29   GLUCOSE 228* 194* 153* 141*  BUN 24* 20 14 14   CREATININE 0.92 0.98 0.93 0.93  CALCIUM 9.7 8.7 8.9 8.7   Liver Function Tests:  Recent Labs Lab 02/01/13 1728 02/02/13 0518  AST 39* 35  ALT 16 30  ALKPHOS 120* 103  BILITOT 0.7 0.5  PROT 6.4 5.7*  ALBUMIN 3.1* 2.7*    Recent Labs Lab 02/01/13 1728 02/02/13 0518  LIPASE 53 50   CBC:  Recent Labs Lab 01/29/13 1450 02/01/13 1728 02/02/13 0023 02/02/13 0518 02/03/13 2208  WBC 4.4* 3.5* 4.1 3.1* 4.6  NEUTROABS 3.5 2.6  --  2.2  --   HGB 8.8 Repeated and verified X2.* 8.7* 8.1* 7.5* 9.7*  HCT 25.8 Repeated and verified X2.* 27.3* 25.7* 23.9* 29.6*  MCV 90.8 93.5 93.8 94.8 90.2  PLT 99.0* 81* 88* 77* 99*    BNP (last 3 results)  Recent Labs  10/15/12 1349 11/12/12 1107 01/18/13 0027  PROBNP 164.0* 180.0* 167.9*   CBG:  Recent Labs Lab 02/03/13 0734 02/03/13 1217 02/03/13 1721 02/03/13 2145 02/04/13 0745  GLUCAP 139* 229* 278* 241* 146*    Signed:  York, Marianne L, PA-C [facility] 02/04/2013, 6:18 PM   Addendum  Patient seen and examined, chart and data base reviewed.  I agree with the above assessment and discharge plan.  For full details please see Mrs. Algis Downs PA note.   Clint Lipps, MD [facility] Pager: [phone] 02/04/2013, 7:20 PM

## 2013-02-03 NOTE — Progress Notes (Signed)
Pt is complaining of some SOB, chest discomfort, and just not feeling well. i listened to her lung sounds which were WDL, VS were taken hr-69, Z6-10 on 4L Nasal canula, T-99.5 BP-164/68. April from rapid response called and is at the bedside.

## 2013-02-03 NOTE — Discharge Summary (Signed)
Addendum  Patient seen and examined, chart and data base reviewed.  I agree with the above assessment and discharge plan.  For full details please see Mrs. Algis Downs PA note.  Nausea and vomiting, Escherichia coli UTI.   Clint Lipps, MD [facility] Pager: [phone] 02/03/2013, 1:17 PM

## 2013-02-03 NOTE — Progress Notes (Signed)
Pt is also complaining of blurred vision in her right eye

## 2013-02-03 NOTE — Discharge Summary (Signed)
Addendum  Patient seen and examined, chart and data base reviewed.  I agree with the above assessment and discharge plan.  For full details please see Mrs. Algis Downs PA note.  Nausea and vomiting, Escherichia coli UTI. She is received 2 units of pRBC and then D/C.

## 2013-02-04 ENCOUNTER — Telehealth: Payer: Self-pay | Admitting: Pulmonary Disease

## 2013-02-04 ENCOUNTER — Inpatient Hospital Stay: Payer: Medicare Other | Admitting: Pulmonary Disease

## 2013-02-04 DIAGNOSIS — E669 Obesity, unspecified: Secondary | ICD-10-CM

## 2013-02-04 DIAGNOSIS — I5033 Acute on chronic diastolic (congestive) heart failure: Secondary | ICD-10-CM

## 2013-02-04 LAB — BASIC METABOLIC PANEL
CO2: 29 mEq/L (ref 19–32)
Chloride: 101 mEq/L (ref 96–112)
Creatinine, Ser: 0.93 mg/dL (ref 0.50–1.10)
GFR calc Af Amer: 71 mL/min — ABNORMAL LOW (ref >90)
Potassium: 3.9 mEq/L (ref 3.5–5.1)
Sodium: 137 mEq/L (ref 135–145)

## 2013-02-04 LAB — TYPE AND SCREEN
ABO/RH(D): O POS
Antibody Screen: POSITIVE
DAT, IgG: NEGATIVE
Donor AG Type: NEGATIVE

## 2013-02-04 LAB — GLUCOSE, CAPILLARY: Glucose-Capillary: 146 mg/dL — ABNORMAL HIGH (ref 70–99)

## 2013-02-04 NOTE — Progress Notes (Signed)
[facility] PROGRESS NOTE  [name] ZOX:[id number] DOB: November 04, 1940 DOA: 02/01/2013 PCP: Michele Mcalpine, MD  Assessment/Plan:  Nausea & vomiting  - Seems to have resolved.  - Etiology remains unclear. However her abdomen is very soft and nontender. X-ray of the abdomen did not show any evidence of bowel obstruction, it did show a significant amount of stool in her colon. Lipase levels are normal as well. Perhaps it is related to either underlying UTI or constipation.  - Diet has been advanced slowly to soft solids.  .  Active Problems:  UTI  - Cultures showed greater than 100,000 colonies e-coli  - Treated with IV ciprofloxacin. Will be progressed to oral Cipro at discharge.  - Afebrile and without leukocytosis.   Chronic GI bleed  - Secondary to angiodysplasia of the intestines  - She chronically has black stools  - Continue with PPI  - Hgb steadily declined over admission. Patient was transfused 2 units of PRBCs on 6/11 prior to D/C.  - She was due for a capsule endoscopy (6/11), this has now been rescheduled for 6/16  -She received 2 units of PRBCs transfused on 6/11 without complications.  Anemia  - Secondary to chronic blood loss- from above  - Monitor H&H periodically   Constipation  - This likely contributed to her nausea and vomiting.  - Xray showed heavy stool burden  - received 1/2 a bowel prep resulting in 4 large bowel movements.  - Will need to continue to use stool softeners at home. Recommend colace bid in addition to miralax.   COPD  - Stable without any wheezing  - She is on chronic oxygen at home  - Continue with albuterol inhaler as needed   Chronic hypoxic respiratory failure  - Secondary to above  - She is on chronic oxygen at home   Chronic diastolic heart failure  - Clinically compensated  - Continue with Lasix. Continue with atenolol.   Chronic tremors  - On Sinemet and Requip-continue   Hypertension  - Controlled with atenolol    Diabetes  - CBGs stable  - Since she does have a patient being discharged soon, will continue with her usual oral hypoglycemic agents as well.   Thrombocytopenia  - This seems to be a chronic issue, monitor platelets periodically.   Procedures:  Blood transfusion. 2 units of PRBCs.      Code Status: full code Family Communication:  Disposition Plan: Discharge to home   Antibiotics:  cipro  HPI/Subjective: Patient feeling much improved after blood transfusion.  Objective: Filed Vitals:   02/03/13 1941 02/03/13 2004 02/03/13 2124 02/04/13 0524  BP: 153/78 154/62 164/68 150/66  Pulse: 69 63 66 58  Temp: 98.3 F (36.8 C) 98.3 F (36.8 C) 99.5 F (37.5 C) 98.2 F (36.8 C)  TempSrc: Oral Oral Oral Oral  Resp: 18 18 20 18   Height:      Weight:    101 kg (222 lb 10.6 oz)  SpO2: 99% 99% 97% 100%    Intake/Output Summary (Last 24 hours) at 02/04/13 0910 Last data filed at 02/03/13 1800  Gross per 24 hour  Intake    615 ml  Output    775 ml  Net   -160 ml   Filed Weights   02/02/13 0526 02/03/13 0510 02/04/13 0524  Weight: 102.9 kg (226 lb 13.7 oz) 101.5 kg (223 lb 12.3 oz) 101 kg (222 lb 10.6 oz)    Exam:  Gen Exam: Awake and alert with clear  speech. Color improved. Neck: Supple, No JVD.  Chest: B/L Clear. No accessory muscle use CVS: S1 S2 Regular, no murmurs.  Abdomen: soft, BS +, non tender, non distended.  Extremities: no edema, lower extremities warm to touch.  Neurologic: Non Focal.  Skin: small rash on left upper back pink/tan in color with small papules.  Wounds: N/A.   Data Reviewed: Basic Metabolic Panel:  Recent Labs Lab 02/01/13 1728 02/02/13 0518 02/03/13 0726  NA 137 140 137  K 3.8 5.2* 5.0  CL 100 106 105  CO2 26 27 28   GLUCOSE 228* 194* 153*  BUN 24* 20 14  CREATININE 0.92 0.98 0.93  CALCIUM 9.7 8.7 8.9   Liver Function Tests:  Recent Labs Lab 02/01/13 1728 02/02/13 0518  AST 39* 35  ALT 16 30  ALKPHOS 120* 103   BILITOT 0.7 0.5  PROT 6.4 5.7*  ALBUMIN 3.1* 2.7*    Recent Labs Lab 02/01/13 1728 02/02/13 0518  LIPASE 53 50   CBC:  Recent Labs Lab 01/29/13 1450 02/01/13 1728 02/02/13 0023 02/02/13 0518 02/03/13 2208  WBC 4.4* 3.5* 4.1 3.1* 4.6  NEUTROABS 3.5 2.6  --  2.2  --   HGB 8.8 Repeated and verified X2.* 8.7* 8.1* 7.5* 9.7*  HCT 25.8 Repeated and verified X2.* 27.3* 25.7* 23.9* 29.6*  MCV 90.8 93.5 93.8 94.8 90.2  PLT 99.0* 81* 88* 77* 99*   BNP (last 3 results)  Recent Labs  10/15/12 1349 11/12/12 1107 01/18/13 0027  PROBNP 164.0* 180.0* 167.9*   CBG:  Recent Labs Lab 02/03/13 0734 02/03/13 1217 02/03/13 1721 02/03/13 2145 02/04/13 0745  GLUCAP 139* 229* 278* 241* 146*    Recent Results (from the past 240 hour(s))  URINE CULTURE     Status: None   Collection Time    02/01/13  6:52 PM      Result Value Range Status   Specimen Description URINE, CLEAN CATCH   Final   Special Requests NONE   Final   Culture  Setup Time 02/01/2013 21:24   Final   Colony Count >=100,000 COLONIES/ML   Final   Culture ESCHERICHIA COLI   Final   Report Status 02/03/2013 FINAL   Final   Organism ID, Bacteria ESCHERICHIA COLI   Final     Studies: US Abdomen Complete  02/02/2013   *RADIOLOGY REPORT*  Clinical Data:  Nausea, vomiting.  Morbid obesity.  History of cirrhosis, diabetes mellitus type 2.  COMPLETE ABDOMINAL ULTRASOUND  Comparison:  Ultrasound the abdomen 09/07/2012  Findings:  Gallbladder:  Gallbladder is distended.  Gallbladder wall is 3.3 mm maximum thickness.  No stones are identified.  No sonographic Murphy's sign.  Common bile duct:  6.2 mm  Liver:  The liver is scalloped, dense, and heterogeneous, consistent with the history of cirrhosis. No focal mass.  IVC:  Appears normal.  Pancreas:  No focal abnormality seen.  Spleen:  Spleen is enlarged, 14.0 cm in cranial caudal length, 939 ml in volume.  A simple cyst is 1.6 x 1.5 x 1.5 cm in the upper region.  Right  Kidney:  11.4 cm in length, normal appearance.  Left Kidney:  11.1 cm in length, normal appearance.  Abdominal aorta:  Distal abdominal aorta is obscured by bowel gas. No aortic aneurysm identified.  Maximum diameter, 2.5 cm.  IMPRESSION:  1.  Mildly thickened wall of the gallbladder, a nonspecific finding often associated with cirrhosis.  No other evidence for acute cholecystitis. 2.  Splenomegaly and cirrhosis. 3.  Normal appearance of the kidneys.   Original Report Authenticated By: Norva Pavlov, M.D.    Scheduled Meds: . ALPRAZolam  1 mg Oral QHS  . Armodafinil  150 mg Oral Daily  . atenolol  50 mg Oral Daily  . buPROPion  150 mg Oral Daily  . carbidopa-levodopa  1 tablet Oral BID  . ciprofloxacin  500 mg Oral BID  . desvenlafaxine  100 mg Oral Daily  . dextromethorphan-guaiFENesin  1-2 tablet Oral Q12H  . furosemide  40 mg Oral Daily  . glimepiride  4 mg Oral QAC breakfast  . insulin aspart  0-9 Units Subcutaneous TID WC  . insulin glargine  40 Units Subcutaneous Daily  . linagliptin  5 mg Oral Daily  . mometasone-formoterol  2 puff Inhalation BID  . pantoprazole  40 mg Oral Daily  . QUEtiapine  400 mg Oral QHS  . rOPINIRole  4 mg Oral QHS  . simvastatin  40 mg Oral QHS  . 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 If 7PM-7AM, please contact night-coverage at [email or web address], password [facility] 02/04/2013, 9:10 AM  LOS: 3 days

## 2013-02-04 NOTE — Progress Notes (Signed)
[name] was notified of pt's symptoms. Rapid at the bedside. Pt stating she thinks it 's more so her anxiety. rn to give pt her scheduled xanax and  Continue to monitor. Inform md and rapid if sxs continue.

## 2013-02-04 NOTE — Progress Notes (Signed)
Pt states "I'm doing much better now"

## 2013-02-04 NOTE — ED Provider Notes (Signed)
Medical screening examination/treatment/procedure(s) were conducted as a shared visit with non-physician practitioner(s) and myself.  I personally evaluated the patient during the encounter  Toy Baker, MD 02/04/13 1311

## 2013-02-04 NOTE — Progress Notes (Signed)
Addendum  Patient seen and examined, chart and data base reviewed.  I agree with the above assessment and plan.  For full details please see Mrs. Algis Downs PA note.   Clint Lipps, MD [facility] Pager: [phone] 02/04/2013, 7:20 PM

## 2013-02-04 NOTE — Telephone Encounter (Signed)
Leigh, did you call the pt? Please advise thanks!

## 2013-02-04 NOTE — Progress Notes (Signed)
DC instructions reviewed with patient.  3 Rx's given.  Instructed patient about all upcoming appointments.  Carefully reviewed home meds with patient.  Each medication was timed with when the next dose was due.  No changes noted since am assessment.  IV DC'ed.  Patient DC home with family member.  NT escorted patient in wheelchair and 4L O2 to the door.  Patient reported that her ride was not going to bring her home O2 tank, but that she was ok and didn't need it on the way home.  Patient denied needs, questions, or concerns.

## 2013-02-05 ENCOUNTER — Ambulatory Visit: Payer: Medicare Other | Admitting: Endocrinology

## 2013-02-05 NOTE — Telephone Encounter (Signed)
Called and spoke with pt and she is aware that we had called to reschedule her appt since she was in the hospital.  This has been rescheduled with TP on 6/23.

## 2013-02-08 ENCOUNTER — Ambulatory Visit (INDEPENDENT_AMBULATORY_CARE_PROVIDER_SITE_OTHER): Payer: Medicare Other | Admitting: Internal Medicine

## 2013-02-08 ENCOUNTER — Ambulatory Visit: Payer: Medicare Other | Admitting: Pulmonary Disease

## 2013-02-08 DIAGNOSIS — D500 Iron deficiency anemia secondary to blood loss (chronic): Secondary | ICD-10-CM

## 2013-02-08 NOTE — Progress Notes (Signed)
Pt here this am she reports she followed the prep and only drank a little water with her pills at 6am. She swallowed the capsule w/o difficulty and stayed here for 30 minutes and showed no s&s of GI upset or vomiting. She was sent home with drinking instructions and he light meal instructions 4 hours later; she stated understanding. Capsule LOT 2014- 03/24221S   25  Pt arrived back at 4:20pm and stated she had no problems. Equipment removed and pt was sent home with instructions to call us for problems and pt stated understanding.

## 2013-02-09 ENCOUNTER — Telehealth: Payer: Self-pay | Admitting: Pulmonary Disease

## 2013-02-09 DIAGNOSIS — J449 Chronic obstructive pulmonary disease, unspecified: Secondary | ICD-10-CM

## 2013-02-09 NOTE — Telephone Encounter (Signed)
Spoke with [name] with [facility]  She states needs to verify o2 liter flow According to our records she is using 3 lpm  The pt tells [facility] that after recent hospital d/c, she needs to be on 4 lpm  I do not see this in the d/c summary however Please advise thanks!

## 2013-02-09 NOTE — Telephone Encounter (Signed)
LMTCB for [name]  

## 2013-02-09 NOTE — Telephone Encounter (Signed)
Per SN---ask them to  Check O2 sat on 3 liters via [place] and with ambulation at home.   Call results to SN.   We cannot document the 4 liter requirement.  thanks

## 2013-02-10 NOTE — Telephone Encounter (Signed)
Spoke with Melissa at [facility] and advised of Dr Jodelle Green recommendations.  Order placed per Dr Kriste Basque

## 2013-02-12 ENCOUNTER — Encounter: Payer: Self-pay | Admitting: Endocrinology

## 2013-02-12 ENCOUNTER — Ambulatory Visit (INDEPENDENT_AMBULATORY_CARE_PROVIDER_SITE_OTHER): Payer: Medicare Other | Admitting: Endocrinology

## 2013-02-12 ENCOUNTER — Encounter: Payer: Self-pay | Admitting: Internal Medicine

## 2013-02-12 VITALS — BP 140/72 | HR 67 | Temp 99.4°F | Resp 12 | Ht 64.0 in | Wt 216.0 lb

## 2013-02-12 DIAGNOSIS — E1049 Type 1 diabetes mellitus with other diabetic neurological complication: Secondary | ICD-10-CM

## 2013-02-12 DIAGNOSIS — E119 Type 2 diabetes mellitus without complications: Secondary | ICD-10-CM

## 2013-02-12 NOTE — Patient Instructions (Addendum)
check your blood sugar twice a day.  vary the time of day when you check, between before the 3 meals, and at bedtime.  also check if you have symptoms of your blood sugar being too high or too low.  please keep a record of the readings and bring it to your next appointment here.  please call us sooner if your blood sugar goes below 70, or if you have a lot of readings over 200.  Please stop the januvia, glimepiride, and metformin.  Also, please increase the lantus to 60 units daily. we will need to take this complex situation in stages. Please come back for a follow-up appointment in 1 month.

## 2013-02-12 NOTE — Progress Notes (Signed)
Subjective:    Patient ID: [name], female    DOB: 23-Dec-1940, 72 y.o.   MRN: [id number]  HPI pt returns for f/u of insulin-requiring DM (dx'ed 2004; she has moderate neuropathy of the lower extremities; she has associated complications of CAD; the interpretation of a1c's has been limited by blood transfusions). She feels better since her recent hospitalization.  she brings a record of her cbg's which i have reviewed today.  It varies from 120-220.  There is no trend throughout the day.   Past Medical History  Diagnosis Date  . Chronic airway obstruction, not elsewhere classified   . Unspecified chronic bronchitis   . Unspecified essential hypertension   . Coronary atherosclerosis of unspecified type of vessel, native or graft   . Palpitations   . Aortic valve disorders   . Pure hypercholesterolemia   . Morbid obesity   . Esophageal reflux   . Angiodysplasia of intestine (without mention of hemorrhage)   . Diverticulosis of colon (without mention of hemorrhage)   . Benign neoplasm of colon   . Autoimmune hepatitis   . Cystitis, unspecified   . Osteoarthrosis, unspecified whether generalized or localized, unspecified site   . Osteoporosis, unspecified   . Restless legs syndrome (RLS)   . Major depressive disorder, recurrent episode, severe, specified as with psychotic behavior   . Iron deficiency anemia secondary to blood loss (chronic)   . Coarse tremors     "just in my arms"  . Carpal tunnel syndrome on both sides   . Dysrhythmia     palpitations  . CHF (congestive heart failure)   . Asthma   . Shortness of breath     "any time really"  . Sleep apnea     "but I don't use the equipment"  . Type II diabetes mellitus   . Blood transfusion   . H/O hiatal hernia   . 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 Adenomatous colon polyp     Past Surgical History  Procedure Laterality Date  . Hemorroidectomy    . Appendectomy    . Vaginal hysterectomy    . Breast biopsy       bilateral  . Cesarean section      x 3  . Appendectomy    . Esophagogastroduodenoscopy  06/12/2012    Procedure: ESOPHAGOGASTRODUODENOSCOPY (EGD);  Surgeon: Hart Carwin, MD;  Location: [facility];  Service: Endoscopy;  Laterality: N/A;  . Balloon dilation  06/12/2012    Procedure: BALLOON DILATION;  Surgeon: Hart Carwin, MD;  Location: [facility];  Service: Endoscopy;  Laterality: N/A;  ?balloon  . Esophagogastroduodenoscopy  09/10/2012    Procedure: ESOPHAGOGASTRODUODENOSCOPY (EGD);  Surgeon: Hart Carwin, MD;  Location: [facility];  Service: Endoscopy;  Laterality: N/A;  . Hot hemostasis  09/10/2012    Procedure: HOT HEMOSTASIS (ARGON PLASMA COAGULATION/BICAP);  Surgeon: Hart Carwin, MD;  Location: [facility];  Service: Endoscopy;  Laterality: N/A;  . Esophagogastroduodenoscopy N/A 01/18/2013    Procedure: ESOPHAGOGASTRODUODENOSCOPY (EGD);  Surgeon: Hart Carwin, MD;  Location: [facility] ENDOSCOPY;  Service: Endoscopy;  Laterality: N/A;    History   Social History  . Marital Status: Married    Spouse Name: jerry Gillott    Number of Children: N/A  . Years of Education: N/A   Occupational History  . Disabled   .     Social History Main Topics  . Smoking status: Former Smoker -- 1.50 packs/day for 25 years  Types: Cigarettes    Quit date: 08/26/1992  . Smokeless tobacco: Never Used  . Alcohol Use: Yes     Comment: 11/22/11 "last alcoholic drink was years ago"  . Drug Use: No  . Sexually Active: No   Other Topics Concern  . Not on file   Social History Narrative  . No narrative on file    Current Outpatient Prescriptions on File Prior to Visit  Medication Sig Dispense Refill  . albuterol (PROAIR HFA) 108 (90 BASE) MCG/ACT inhaler Inhale 2 puffs into the lungs every 6 (six) hours as needed for shortness of breath.       . ALPRAZolam (XANAX) 1 MG tablet Take 1 mg by mouth at bedtime.       . Armodafinil (NUVIGIL) 150 MG tablet Take 150 mg by mouth daily.      [name]  atenolol (TENORMIN) 50 MG tablet Take 50 mg by mouth daily.      [name] b complex vitamins tablet Take 1 tablet by mouth daily.       . B-D ULTRAFINE III SHORT PEN 31G X 8 MM MISC USE AS DIRECTED  100 each  3  . buPROPion (WELLBUTRIN XL) 150 MG 24 hr tablet Take 150 mg by mouth daily.      . carbidopa-levodopa (SINEMET CR) 25-100 MG per tablet Take 1 tablet by mouth 2 (two) times daily. Take with lunch and dinner      . ciprofloxacin (CIPRO) 500 MG tablet Take 1 tablet (500 mg total) by mouth 2 (two) times daily.  6 tablet  0  . desvenlafaxine (PRISTIQ) 100 MG 24 hr tablet Take 100 mg by mouth daily.      [name] dextromethorphan-guaiFENesin (MUCINEX DM) 30-600 MG per 12 hr tablet Take 1-2 tablets by mouth every 12 (twelve) hours.       . docusate sodium (COLACE) 100 MG capsule Take 1 capsule (100 mg total) by mouth 2 (two) times daily.  10 capsule  0  . Fluticasone-Salmeterol (ADVAIR DISKUS) 250-50 MCG/DOSE AEPB Inhale 1 puff into the lungs 2 (two) times daily.  1 each  0  . furosemide (LASIX) 40 MG tablet Take 1 tablet (40 mg total) by mouth daily.  30 tablet  6  . insulin glargine (LANTUS) 100 UNIT/ML injection Inject 60 Units into the skin daily.       . Multiple Vitamins-Minerals (CENTRUM SILVER PO) Take 1 tablet by mouth daily.       . pantoprazole (PROTONIX) 40 MG tablet Take 40 mg by mouth daily.      . polyethylene glycol powder (GLYCOLAX/MIRALAX) powder Take 17 g by mouth daily.  255 g  0  . potassium chloride SA (K-DUR,KLOR-CON) 10 MEQ tablet Take 1 tablet (10 mEq total) by mouth every other day.  30 tablet  1  . promethazine (PHENERGAN) 12.5 MG tablet Take one po every 4-6 hours prn nausea  30 tablet  0  . QUEtiapine (SEROQUEL) 100 MG tablet Take 400 mg by mouth at bedtime.       [name] rOPINIRole (REQUIP) 4 MG tablet Take 4 mg by mouth at bedtime.      . Simethicone (GAS-X PO) Take 1 tablet by mouth daily. Takes after the biggest meal of the day      . simvastatin (ZOCOR) 40 MG tablet Take 40 mg by  mouth at bedtime.      . sodium chloride (OCEAN) 0.65 % SOLN nasal spray Place 1 spray into the nose as needed for  congestion.    0  . Vitamin D, Ergocalciferol, (DRISDOL) 50000 UNITS CAPS Take 1 capsule (50,000 Units total) by mouth every 7 (seven) days. Take on friday  5 capsule  11   No current facility-administered medications on file prior to visit.    Allergies  Allergen Reactions  . Aspirin Other (See Comments)    Causes nosebleeds  . Penicillins Itching  . Theophylline Nausea And Vomiting    Family History  Problem Relation Age of Onset  . Heart disease Mother   . Breast cancer Sister   . Cervical cancer Mother   . Diabetes Father   . Colon cancer Neg Hx   . Breast cancer Maternal Aunt     x 2  . Diabetes Father   . Diabetes Sister   . Kidney disease Mother     BP 140/72  Pulse 67  Temp(Src) 99.4 F (37.4 C) (Oral)  Resp 12  Ht 5\' 4"  (1.626 m)  Wt 216 lb (97.977 kg)  BMI 37.06 kg/m2  SpO2 96%   Review of Systems denies hypoglycemia and weight change.      Objective:   Physical Exam VITAL SIGNS:  See vs page GENERAL: no distress SKIN:  Insulin injection sites at the anterior abdomen are normal.     Assessment & Plan:  DM: This insulin regimen was chosen from multiple options, for its simplicity.  We hope to be able to adjust the insulin regimen later.  The benefits of glycemic control must be weighed against the risks of hypoglycemia.

## 2013-02-13 ENCOUNTER — Encounter: Payer: Self-pay | Admitting: Internal Medicine

## 2013-02-14 ENCOUNTER — Other Ambulatory Visit: Payer: Self-pay | Admitting: Pulmonary Disease

## 2013-02-15 ENCOUNTER — Telehealth: Payer: Self-pay | Admitting: Internal Medicine

## 2013-02-15 ENCOUNTER — Other Ambulatory Visit: Payer: Self-pay | Admitting: *Deleted

## 2013-02-15 ENCOUNTER — Inpatient Hospital Stay: Payer: Medicare Other | Admitting: Adult Health

## 2013-02-15 DIAGNOSIS — D500 Iron deficiency anemia secondary to blood loss (chronic): Secondary | ICD-10-CM

## 2013-02-15 NOTE — Telephone Encounter (Signed)
Please call pt with results of the SBCE which shows additional avm's in the small bowl, the same lesions we have been ablating in the stomach. It Is likely to be causing low grade blood loss. We need to monitor H/H closely, every 4 weeks. ----- Message ----- From: Lennie Odor Sent: 02/12/2013 2:29 PM To: Hart Carwin, MD  Spoke with patient and gave her results and recommendations. She has not passed the capsule yet. Please, advise.

## 2013-02-15 NOTE — Telephone Encounter (Signed)
Message copied by Daphine Deutscher on Mon Feb 15, 2013 10:55 AM ------      Message from: Daphine Deutscher      Created: Mon Feb 01, 2013  8:58 AM       Call and remind CBC due on 02/15/13 Db ------

## 2013-02-15 NOTE — Telephone Encounter (Signed)
Most of the time , the capsule is not detected by the patient. She has no hx of obstruction.

## 2013-02-15 NOTE — Telephone Encounter (Signed)
Spoke with patient and she had not seen the capsule pass in her stool. She had the SBCE on 02/08/13. She will come on Thursday for labs and to see Dr. Kriste Basque. She reports she did received 2 units of PRBC's on 02/03/13. Please, advise.

## 2013-02-15 NOTE — Telephone Encounter (Signed)
Message copied by Daphine Deutscher on Mon Feb 15, 2013 10:56 AM ------      Message from: Daphine Deutscher      Created: Mon Feb 01, 2013  8:58 AM       Call and remind CBC due on 02/15/13 Db ------

## 2013-02-16 ENCOUNTER — Telehealth: Payer: Self-pay | Admitting: Pulmonary Disease

## 2013-02-16 ENCOUNTER — Emergency Department (HOSPITAL_COMMUNITY): Payer: Medicare Other

## 2013-02-16 ENCOUNTER — Encounter (HOSPITAL_COMMUNITY): Payer: Self-pay | Admitting: *Deleted

## 2013-02-16 ENCOUNTER — Inpatient Hospital Stay (HOSPITAL_COMMUNITY)
Admission: EM | Admit: 2013-02-16 | Discharge: 2013-02-19 | DRG: 291 | Disposition: A | Discharge status: Home / Self Care | Payer: Medicare Other | Attending: Internal Medicine | Admitting: Internal Medicine

## 2013-02-16 DIAGNOSIS — D126 Benign neoplasm of colon, unspecified: Secondary | ICD-10-CM

## 2013-02-16 DIAGNOSIS — K754 Autoimmune hepatitis: Secondary | ICD-10-CM

## 2013-02-16 DIAGNOSIS — Z87891 Personal history of nicotine dependence: Secondary | ICD-10-CM

## 2013-02-16 DIAGNOSIS — I509 Heart failure, unspecified: Secondary | ICD-10-CM

## 2013-02-16 DIAGNOSIS — R251 Tremor, unspecified: Secondary | ICD-10-CM

## 2013-02-16 DIAGNOSIS — E1049 Type 1 diabetes mellitus with other diabetic neurological complication: Secondary | ICD-10-CM

## 2013-02-16 DIAGNOSIS — I2510 Atherosclerotic heart disease of native coronary artery without angina pectoris: Secondary | ICD-10-CM

## 2013-02-16 DIAGNOSIS — I5033 Acute on chronic diastolic (congestive) heart failure: Principal | ICD-10-CM

## 2013-02-16 DIAGNOSIS — G4730 Sleep apnea, unspecified: Secondary | ICD-10-CM | POA: Diagnosis present

## 2013-02-16 DIAGNOSIS — K219 Gastro-esophageal reflux disease without esophagitis: Secondary | ICD-10-CM

## 2013-02-16 DIAGNOSIS — R531 Weakness: Secondary | ICD-10-CM

## 2013-02-16 DIAGNOSIS — D500 Iron deficiency anemia secondary to blood loss (chronic): Secondary | ICD-10-CM

## 2013-02-16 DIAGNOSIS — I359 Nonrheumatic aortic valve disorder, unspecified: Secondary | ICD-10-CM

## 2013-02-16 DIAGNOSIS — K5730 Diverticulosis of large intestine without perforation or abscess without bleeding: Secondary | ICD-10-CM

## 2013-02-16 DIAGNOSIS — E119 Type 2 diabetes mellitus without complications: Secondary | ICD-10-CM

## 2013-02-16 DIAGNOSIS — N390 Urinary tract infection, site not specified: Secondary | ICD-10-CM

## 2013-02-16 DIAGNOSIS — Z794 Long term (current) use of insulin: Secondary | ICD-10-CM

## 2013-02-16 DIAGNOSIS — R0989 Other specified symptoms and signs involving the circulatory and respiratory systems: Secondary | ICD-10-CM

## 2013-02-16 DIAGNOSIS — R0602 Shortness of breath: Secondary | ICD-10-CM

## 2013-02-16 DIAGNOSIS — M1990 Unspecified osteoarthritis, unspecified site: Secondary | ICD-10-CM

## 2013-02-16 DIAGNOSIS — F333 Major depressive disorder, recurrent, severe with psychotic symptoms: Secondary | ICD-10-CM

## 2013-02-16 DIAGNOSIS — D61818 Other pancytopenia: Secondary | ICD-10-CM

## 2013-02-16 DIAGNOSIS — M810 Age-related osteoporosis without current pathological fracture: Secondary | ICD-10-CM

## 2013-02-16 DIAGNOSIS — J449 Chronic obstructive pulmonary disease, unspecified: Secondary | ICD-10-CM | POA: Diagnosis present

## 2013-02-16 DIAGNOSIS — I8500 Esophageal varices without bleeding: Secondary | ICD-10-CM

## 2013-02-16 DIAGNOSIS — I10 Essential (primary) hypertension: Secondary | ICD-10-CM

## 2013-02-16 DIAGNOSIS — E7800 Pure hypercholesterolemia, unspecified: Secondary | ICD-10-CM

## 2013-02-16 DIAGNOSIS — J42 Unspecified chronic bronchitis: Secondary | ICD-10-CM

## 2013-02-16 DIAGNOSIS — R112 Nausea with vomiting, unspecified: Secondary | ICD-10-CM

## 2013-02-16 DIAGNOSIS — K31819 Angiodysplasia of stomach and duodenum without bleeding: Secondary | ICD-10-CM

## 2013-02-16 DIAGNOSIS — IMO0001 Reserved for inherently not codable concepts without codable children: Secondary | ICD-10-CM

## 2013-02-16 DIAGNOSIS — I472 Ventricular tachycardia, unspecified: Secondary | ICD-10-CM

## 2013-02-16 DIAGNOSIS — R1310 Dysphagia, unspecified: Secondary | ICD-10-CM

## 2013-02-16 DIAGNOSIS — J4489 Other specified chronic obstructive pulmonary disease: Secondary | ICD-10-CM

## 2013-02-16 DIAGNOSIS — Z9981 Dependence on supplemental oxygen: Secondary | ICD-10-CM

## 2013-02-16 DIAGNOSIS — D696 Thrombocytopenia, unspecified: Secondary | ICD-10-CM | POA: Diagnosis present

## 2013-02-16 DIAGNOSIS — K922 Gastrointestinal hemorrhage, unspecified: Secondary | ICD-10-CM

## 2013-02-16 DIAGNOSIS — N3000 Acute cystitis without hematuria: Secondary | ICD-10-CM

## 2013-02-16 DIAGNOSIS — D649 Anemia, unspecified: Secondary | ICD-10-CM

## 2013-02-16 DIAGNOSIS — G2581 Restless legs syndrome: Secondary | ICD-10-CM

## 2013-02-16 DIAGNOSIS — J441 Chronic obstructive pulmonary disease with (acute) exacerbation: Secondary | ICD-10-CM

## 2013-02-16 DIAGNOSIS — D509 Iron deficiency anemia, unspecified: Secondary | ICD-10-CM

## 2013-02-16 DIAGNOSIS — K7460 Unspecified cirrhosis of liver: Secondary | ICD-10-CM | POA: Diagnosis present

## 2013-02-16 DIAGNOSIS — N3090 Cystitis, unspecified without hematuria: Secondary | ICD-10-CM

## 2013-02-16 DIAGNOSIS — R002 Palpitations: Secondary | ICD-10-CM

## 2013-02-16 DIAGNOSIS — J9620 Acute and chronic respiratory failure, unspecified whether with hypoxia or hypercapnia: Secondary | ICD-10-CM | POA: Diagnosis present

## 2013-02-16 DIAGNOSIS — R5381 Other malaise: Secondary | ICD-10-CM | POA: Diagnosis present

## 2013-02-16 DIAGNOSIS — K5901 Slow transit constipation: Secondary | ICD-10-CM

## 2013-02-16 DIAGNOSIS — K5520 Angiodysplasia of colon without hemorrhage: Secondary | ICD-10-CM

## 2013-02-16 DIAGNOSIS — E669 Obesity, unspecified: Secondary | ICD-10-CM

## 2013-02-16 HISTORY — DX: Anxiety disorder, unspecified: F41.9

## 2013-02-16 LAB — CBC WITH DIFFERENTIAL/PLATELET
Eosinophils Absolute: 0.2 10*3/uL (ref 0.0–0.7)
Eosinophils Relative: 4 % (ref 0–5)
HCT: 28.8 % — ABNORMAL LOW (ref 36.0–46.0)
Lymphs Abs: 0.4 10*3/uL — ABNORMAL LOW (ref 0.7–4.0)
MCH: 29.7 pg (ref 26.0–34.0)
MCV: 92.9 fL (ref 78.0–100.0)
Monocytes Absolute: 0.3 10*3/uL (ref 0.1–1.0)
Monocytes Relative: 8 % (ref 3–12)
Platelets: 84 10*3/uL — ABNORMAL LOW (ref 150–400)
RBC: 3.1 MIL/uL — ABNORMAL LOW (ref 3.87–5.11)

## 2013-02-16 LAB — COMPREHENSIVE METABOLIC PANEL
BUN: 15 mg/dL (ref 6–23)
Calcium: 8.9 mg/dL (ref 8.4–10.5)
Creatinine, Ser: 0.87 mg/dL (ref 0.50–1.10)
GFR calc Af Amer: 77 mL/min — ABNORMAL LOW (ref >90)
GFR calc non Af Amer: 66 mL/min — ABNORMAL LOW (ref >90)
Glucose, Bld: 311 mg/dL — ABNORMAL HIGH (ref 70–99)
Sodium: 136 mEq/L (ref 135–145)
Total Protein: 6.4 g/dL (ref 6.0–8.3)

## 2013-02-16 LAB — POCT I-STAT TROPONIN I: Troponin i, poc: 0.03 ng/mL (ref 0.00–0.08)

## 2013-02-16 LAB — TROPONIN I: Troponin I: <0.3 ng/mL (ref <0.30)

## 2013-02-16 MED ORDER — ONDANSETRON HCL 4 MG/2ML IJ SOLN: 4.0000 mg | Freq: Four times a day (QID) | INTRAMUSCULAR | Status: DC | PRN

## 2013-02-16 MED ORDER — PANTOPRAZOLE SODIUM 40 MG PO TBEC
40.0000 mg | DELAYED_RELEASE_TABLET | Freq: Every day | ORAL | Status: DC
  Administered 2013-02-17 – 2013-02-19 (×4): 40 mg via ORAL
  Filled 2013-02-16 (×4): qty 1

## 2013-02-16 MED ORDER — LISINOPRIL 2.5 MG PO TABS
2.5000 mg | ORAL_TABLET | Freq: Every day | ORAL | Status: DC
  Filled 2013-02-16: qty 1

## 2013-02-16 MED ORDER — SODIUM CHLORIDE 0.9 % IV SOLN: 250.0000 mL | INTRAVENOUS | Status: DC | PRN

## 2013-02-16 MED ORDER — ALBUTEROL SULFATE HFA 108 (90 BASE) MCG/ACT IN AERS: 2.0000 | INHALATION_SPRAY | Freq: Four times a day (QID) | RESPIRATORY_TRACT | Status: DC | PRN

## 2013-02-16 MED ORDER — SIMVASTATIN 40 MG PO TABS
40.0000 mg | ORAL_TABLET | Freq: Every day | ORAL | Status: DC
  Administered 2013-02-17 – 2013-02-18 (×3): 40 mg via ORAL
  Filled 2013-02-16 (×4): qty 1

## 2013-02-16 MED ORDER — DM-GUAIFENESIN ER 30-600 MG PO TB12
1.0000 | ORAL_TABLET | Freq: Two times a day (BID) | ORAL | Status: DC
  Administered 2013-02-17 (×2): 2 via ORAL
  Administered 2013-02-17: 1 via ORAL
  Administered 2013-02-18: 2 via ORAL
  Administered 2013-02-18 – 2013-02-19 (×2): 1 via ORAL
  Filled 2013-02-16 (×8): qty 2

## 2013-02-16 MED ORDER — IPRATROPIUM BROMIDE 0.02 % IN SOLN
0.5000 mg | Freq: Once | RESPIRATORY_TRACT | Status: AC
  Administered 2013-02-16: 0.5 mg via RESPIRATORY_TRACT
  Filled 2013-02-16: qty 2.5

## 2013-02-16 MED ORDER — QUETIAPINE FUMARATE 200 MG PO TABS
400.0000 mg | ORAL_TABLET | Freq: Every day | ORAL | Status: DC
  Administered 2013-02-17 – 2013-02-18 (×3): 400 mg via ORAL
  Filled 2013-02-16 (×2): qty 2
  Filled 2013-02-16 (×2): qty 1

## 2013-02-16 MED ORDER — BUPROPION HCL ER (XL) 150 MG PO TB24
150.0000 mg | ORAL_TABLET | Freq: Every day | ORAL | Status: DC
  Administered 2013-02-17 – 2013-02-19 (×4): 150 mg via ORAL
  Filled 2013-02-16 (×4): qty 1

## 2013-02-16 MED ORDER — POTASSIUM CHLORIDE CRYS ER 10 MEQ PO TBCR
10.0000 meq | EXTENDED_RELEASE_TABLET | ORAL | Status: DC
  Administered 2013-02-17 – 2013-02-18 (×2): 10 meq via ORAL
  Filled 2013-02-16 (×2): qty 1

## 2013-02-16 MED ORDER — ALPRAZOLAM 0.5 MG PO TABS
1.0000 mg | ORAL_TABLET | Freq: Two times a day (BID) | ORAL | Status: DC | PRN
  Administered 2013-02-17 (×2): 1 mg via ORAL
  Filled 2013-02-16 (×3): qty 2

## 2013-02-16 MED ORDER — SODIUM CHLORIDE 0.9 % IJ SOLN
3.0000 mL | INTRAMUSCULAR | Status: DC | PRN
  Administered 2013-02-18: 3 mL via INTRAVENOUS

## 2013-02-16 MED ORDER — ALBUTEROL SULFATE (5 MG/ML) 0.5% IN NEBU: 2.5000 mg | INHALATION_SOLUTION | RESPIRATORY_TRACT | Status: AC | PRN

## 2013-02-16 MED ORDER — PROMETHAZINE HCL 25 MG PO TABS: 25.0000 mg | ORAL_TABLET | Freq: Two times a day (BID) | ORAL | Status: DC | PRN

## 2013-02-16 MED ORDER — DESVENLAFAXINE SUCCINATE ER 100 MG PO TB24
100.0000 mg | ORAL_TABLET | Freq: Every day | ORAL | Status: DC
  Administered 2013-02-17 – 2013-02-19 (×3): 100 mg via ORAL
  Filled 2013-02-16 (×3): qty 1

## 2013-02-16 MED ORDER — HYDRALAZINE HCL 20 MG/ML IJ SOLN: 5.0000 mg | INTRAMUSCULAR | Status: DC | PRN

## 2013-02-16 MED ORDER — FUROSEMIDE 10 MG/ML IJ SOLN
40.0000 mg | Freq: Two times a day (BID) | INTRAMUSCULAR | Status: DC
  Administered 2013-02-17 – 2013-02-19 (×5): 40 mg via INTRAVENOUS
  Filled 2013-02-16 (×7): qty 4

## 2013-02-16 MED ORDER — ROPINIROLE HCL 1 MG PO TABS
4.0000 mg | ORAL_TABLET | Freq: Every day | ORAL | Status: DC
  Administered 2013-02-17 – 2013-02-18 (×3): 4 mg via ORAL
  Filled 2013-02-16 (×5): qty 4

## 2013-02-16 MED ORDER — ARMODAFINIL 150 MG PO TABS
150.0000 mg | ORAL_TABLET | Freq: Every day | ORAL | Status: DC
  Administered 2013-02-17 – 2013-02-18 (×3): 150 mg via ORAL

## 2013-02-16 MED ORDER — POLYETHYLENE GLYCOL 3350 17 G PO PACK
17.0000 g | PACK | Freq: Every day | ORAL | Status: DC | PRN
  Filled 2013-02-16: qty 1

## 2013-02-16 MED ORDER — INSULIN ASPART 100 UNIT/ML ~~LOC~~ SOLN
0.0000 [IU] | Freq: Three times a day (TID) | SUBCUTANEOUS | Status: DC
  Administered 2013-02-17: 4 [IU] via SUBCUTANEOUS
  Administered 2013-02-17 (×2): 7 [IU] via SUBCUTANEOUS
  Administered 2013-02-18: 11 [IU] via SUBCUTANEOUS
  Administered 2013-02-18: 7 [IU] via SUBCUTANEOUS
  Administered 2013-02-18: 4 [IU] via SUBCUTANEOUS
  Administered 2013-02-19: 7 [IU] via SUBCUTANEOUS
  Administered 2013-02-19: 3 [IU] via SUBCUTANEOUS
  Administered 2013-02-19: 7 [IU] via SUBCUTANEOUS

## 2013-02-16 MED ORDER — ALBUTEROL SULFATE (5 MG/ML) 0.5% IN NEBU
5.0000 mg | INHALATION_SOLUTION | Freq: Once | RESPIRATORY_TRACT | Status: AC
  Administered 2013-02-16: 5 mg via RESPIRATORY_TRACT
  Filled 2013-02-16: qty 1

## 2013-02-16 MED ORDER — LISINOPRIL 5 MG PO TABS
5.0000 mg | ORAL_TABLET | Freq: Every day | ORAL | Status: DC
  Administered 2013-02-17 – 2013-02-19 (×3): 5 mg via ORAL
  Filled 2013-02-16 (×3): qty 1

## 2013-02-16 MED ORDER — ATENOLOL 50 MG PO TABS
50.0000 mg | ORAL_TABLET | Freq: Every day | ORAL | Status: DC
  Administered 2013-02-17 – 2013-02-18 (×3): 50 mg via ORAL
  Filled 2013-02-16 (×3): qty 1

## 2013-02-16 MED ORDER — FUROSEMIDE 10 MG/ML IJ SOLN
40.0000 mg | Freq: Once | INTRAMUSCULAR | Status: AC
  Administered 2013-02-16: 40 mg via INTRAVENOUS
  Filled 2013-02-16: qty 4

## 2013-02-16 MED ORDER — ARMODAFINIL 150 MG PO TABS: 150.0000 mg | ORAL_TABLET | Freq: Every day | ORAL | Status: DC

## 2013-02-16 MED ORDER — INSULIN GLARGINE 100 UNIT/ML ~~LOC~~ SOLN
30.0000 [IU] | Freq: Every day | SUBCUTANEOUS | Status: DC
  Administered 2013-02-17 – 2013-02-18 (×3): 30 [IU] via SUBCUTANEOUS
  Filled 2013-02-16 (×3): qty 0.3

## 2013-02-16 MED ORDER — MOMETASONE FURO-FORMOTEROL FUM 100-5 MCG/ACT IN AERO
2.0000 | INHALATION_SPRAY | Freq: Two times a day (BID) | RESPIRATORY_TRACT | Status: DC
  Administered 2013-02-17 – 2013-02-19 (×5): 2 via RESPIRATORY_TRACT
  Filled 2013-02-16: qty 8.8

## 2013-02-16 MED ORDER — ACETAMINOPHEN 325 MG PO TABS
650.0000 mg | ORAL_TABLET | ORAL | Status: DC | PRN
  Administered 2013-02-17 – 2013-02-18 (×2): 650 mg via ORAL
  Filled 2013-02-16 (×2): qty 2

## 2013-02-16 MED ORDER — CARBIDOPA-LEVODOPA CR 25-100 MG PO TBCR
1.0000 | EXTENDED_RELEASE_TABLET | Freq: Two times a day (BID) | ORAL | Status: DC
  Administered 2013-02-17 – 2013-02-19 (×6): 1 via ORAL
  Filled 2013-02-16 (×10): qty 1

## 2013-02-16 MED ORDER — SODIUM CHLORIDE 0.9 % IJ SOLN
3.0000 mL | Freq: Two times a day (BID) | INTRAMUSCULAR | Status: DC
  Administered 2013-02-17 – 2013-02-19 (×6): 3 mL via INTRAVENOUS

## 2013-02-16 NOTE — H&P (Addendum)
[facility] History and Physical  [name] ZOX:[id number] DOB: 1940/11/20 DOA: 02/16/2013  Referring physician: ED PCP: Michele Mcalpine, MD   Chief Complaint: SOB  HPI: [name] is a 72 y.o. female with h/o severe COPD 3L home O2 at baseline, who presents to the ED with c/o worsening SOB especially with activity at home.  This is associated with dry non-productive cough.  She does note that on Sunday she also had worsening BLE edema.  The severity is such that any activity including walking around her house causes her to become more SOB.  Nothing seems to make symptoms better.  Lying down flat makes symptoms worse.  In ED she desatted into the upper 80s when ambulating but was satting well on 3L at rest.  Work up demonstrated pulmonary vascular congestion suggestive of acute CHF exacerbation.  Review of Systems: Positive for leg and abdominal swelling, 12 systems reviewed and otherwise negative.  Past Medical History  Diagnosis Date  . Chronic airway obstruction, not elsewhere classified   . Unspecified chronic bronchitis   . Unspecified essential hypertension   . Coronary atherosclerosis of unspecified type of vessel, native or graft   . Palpitations   . Aortic valve disorders   . Pure hypercholesterolemia   . Morbid obesity   . Esophageal reflux   . Angiodysplasia of intestine (without mention of hemorrhage)   . Diverticulosis of colon (without mention of hemorrhage)   . Benign neoplasm of colon   . Autoimmune hepatitis   . Cystitis, unspecified   . Osteoarthrosis, unspecified whether generalized or localized, unspecified site   . Osteoporosis, unspecified   . Restless legs syndrome (RLS)   . Major depressive disorder, recurrent episode, severe, specified as with psychotic behavior   . Iron deficiency anemia secondary to blood loss (chronic)   . Coarse tremors     "just in my arms"  . Carpal tunnel syndrome on both sides   . Dysrhythmia     palpitations  .  CHF (congestive heart failure)   . Asthma   . Shortness of breath     "any time really"  . Sleep apnea     "but I don't use the equipment"  . Type II diabetes mellitus   . Blood transfusion   . H/O hiatal hernia   . 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 Adenomatous colon polyp    Past Surgical History  Procedure Laterality Date  . Hemorroidectomy    . Appendectomy    . Vaginal hysterectomy    . Breast biopsy      bilateral  . Cesarean section      x 3  . Appendectomy    . Esophagogastroduodenoscopy  06/12/2012    Procedure: ESOPHAGOGASTRODUODENOSCOPY (EGD);  Surgeon: Hart Carwin, MD;  Location: [facility];  Service: Endoscopy;  Laterality: N/A;  . Balloon dilation  06/12/2012    Procedure: BALLOON DILATION;  Surgeon: Hart Carwin, MD;  Location: [facility];  Service: Endoscopy;  Laterality: N/A;  ?balloon  . Esophagogastroduodenoscopy  09/10/2012    Procedure: ESOPHAGOGASTRODUODENOSCOPY (EGD);  Surgeon: Hart Carwin, MD;  Location: [facility];  Service: Endoscopy;  Laterality: N/A;  . Hot hemostasis  09/10/2012    Procedure: HOT HEMOSTASIS (ARGON PLASMA COAGULATION/BICAP);  Surgeon: Hart Carwin, MD;  Location: [facility];  Service: Endoscopy;  Laterality: N/A;  . Esophagogastroduodenoscopy N/A 01/18/2013    Procedure: ESOPHAGOGASTRODUODENOSCOPY (EGD);  Surgeon: Hart Carwin, MD;  Location: [facility] ENDOSCOPY;  Service: Endoscopy;  Laterality: N/A;   Social History:  reports that she quit smoking about 20 years ago. Her smoking use included Cigarettes. She has a 37.5 pack-year smoking history. She has never used smokeless tobacco. She reports that  drinks alcohol. She reports that she does not use illicit drugs.   Allergies  Allergen Reactions  . Aspirin Other (See Comments)    Causes nosebleeds  . Penicillins Itching  . Theophylline Nausea And Vomiting    Family History  Problem Relation Age of Onset  . Heart disease Mother   . Breast cancer Sister   . Cervical  cancer Mother   . Diabetes Father   . Colon cancer Neg Hx   . Breast cancer Maternal Aunt     x 2  . Diabetes Father   . Diabetes Sister   . Kidney disease Mother      Prior to Admission medications   Medication Sig Start Date End Date Taking? Authorizing Provider  albuterol (PROAIR HFA) 108 (90 BASE) MCG/ACT inhaler Inhale 2 puffs into the lungs every 6 (six) hours as needed for shortness of breath.    Yes Historical Provider, MD  ALPRAZolam Prudy Feeler) 1 MG tablet Take 1 mg by mouth 2 (two) times daily as needed for anxiety.    Yes Historical Provider, MD  Armodafinil (NUVIGIL) 150 MG tablet Take 150 mg by mouth daily.   Yes Historical Provider, MD  atenolol (TENORMIN) 50 MG tablet Take 50 mg by mouth daily.   Yes Historical Provider, MD  b complex vitamins tablet Take 1 tablet by mouth daily.    Yes Historical Provider, MD  buPROPion (WELLBUTRIN XL) 150 MG 24 hr tablet Take 150 mg by mouth daily.   Yes Historical Provider, MD  carbidopa-levodopa (SINEMET CR) 25-100 MG per tablet Take 1 tablet by mouth 2 (two) times daily. Take with lunch and dinner   Yes Historical Provider, MD  desvenlafaxine (PRISTIQ) 100 MG 24 hr tablet Take 100 mg by mouth daily.   Yes Historical Provider, MD  dextromethorphan-guaiFENesin (MUCINEX DM) 30-600 MG per 12 hr tablet Take 1-2 tablets by mouth every 12 (twelve) hours.    Yes Historical Provider, MD  Fluticasone-Salmeterol (ADVAIR DISKUS) 250-50 MCG/DOSE AEPB Inhale 1 puff into the lungs 2 (two) times daily. 01/26/13  Yes Michele Mcalpine, MD  furosemide (LASIX) 40 MG tablet Take 1 tablet (40 mg total) by mouth daily. 10/04/12  Yes Michele Mcalpine, MD  hydrocortisone cream (NEOSPORIN ECZEMA ESSENT MAX ST) 1 % Apply 1 application topically at bedtime. For rash on back and shoulders   Yes Historical Provider, MD  insulin glargine (LANTUS) 100 UNIT/ML injection Inject 60 Units into the skin daily.    Yes Historical Provider, MD  Multiple Vitamin (MULTIVITAMIN WITH MINERALS)  TABS Take 1 tablet by mouth daily. Centrum Silver   Yes Historical Provider, MD  pantoprazole (PROTONIX) 40 MG tablet Take 40 mg by mouth daily.   Yes Historical Provider, MD  polyethylene glycol (MIRALAX / GLYCOLAX) packet Take 17 g by mouth daily as needed (constipation).   Yes Historical Provider, MD  potassium chloride SA (K-DUR,KLOR-CON) 10 MEQ tablet Take 1 tablet (10 mEq total) by mouth every other day. 02/03/13  Yes Tora Kindred York, PA-C  promethazine (PHENERGAN) 25 MG tablet Take 25 mg by mouth 2 (two) times daily as needed for nausea.   Yes Historical Provider, MD  QUEtiapine (SEROQUEL) 100 MG tablet Take 400 mg by mouth at bedtime.  11/22/11  Yes  Historical Provider, MD  rOPINIRole (REQUIP) 4 MG tablet Take 4 mg by mouth at bedtime.   Yes Historical Provider, MD  Saline (OCEAN NASAL SPRAY NA) Place 1 spray into the nose daily as needed (congestion).   Yes Historical Provider, MD  Simethicone (GAS-X PO) Take 1 tablet by mouth daily. Takes after the biggest meal of the day   Yes Historical Provider, MD  simvastatin (ZOCOR) 40 MG tablet Take 40 mg by mouth at bedtime.   Yes Historical Provider, MD  Vitamin D, Ergocalciferol, (DRISDOL) 50000 UNITS CAPS Take 1 capsule (50,000 Units total) by mouth every 7 (seven) days. Take on friday 02/24/12  Yes Michele Mcalpine, MD  B-D ULTRAFINE III SHORT PEN 31G X 8 MM MISC USE AS DIRECTED 01/16/13   Michele Mcalpine, MD   Physical Exam: Filed Vitals:   02/16/13 1930 02/16/13 2000 02/16/13 2023 02/16/13 2053  BP:  151/52    Pulse: 56 55    Temp:    99 F (37.2 C)  TempSrc:    Oral  Resp: 18 12    SpO2: 98% 98% 96%     General:  NAD, resting comfortably in bed Eyes: PEERLA EOMI ENT: mucous membranes moist Neck: supple w/o JVD Cardiovascular: RRR w/o MRG Respiratory: CTA B Abdomen: soft, nt, obese, bs+ Skin: no rash nor lesion Musculoskeletal: MAE, full ROM all 4 extremities Psychiatric: normal tone and affect Neurologic: AAOx3, grossly  non-focal  Labs on Admission:  Basic Metabolic Panel:  Recent Labs Lab 02/16/13 1629  NA 136  K 3.7  CL 99  CO2 29  GLUCOSE 311*  BUN 15  CREATININE 0.87  CALCIUM 8.9   Liver Function Tests:  Recent Labs Lab 02/16/13 1629  AST 60*  ALT 41*  ALKPHOS 151*  BILITOT 0.9  PROT 6.4  ALBUMIN 3.1*   No results found for this basename: LIPASE, AMYLASE,  in the last 168 hours No results found for this basename: AMMONIA,  in the last 168 hours CBC:  Recent Labs Lab 02/16/13 1629  WBC 4.4  NEUTROABS 3.4  HGB 9.2*  HCT 28.8*  MCV 92.9  PLT 84*   Cardiac Enzymes: No results found for this basename: CKTOTAL, CKMB, CKMBINDEX, TROPONINI,  in the last 168 hours  BNP (last 3 results)  Recent Labs  11/12/12 1107 01/18/13 0027 02/16/13 1629  PROBNP 180.0* 167.9* 351.8*   CBG: No results found for this basename: GLUCAP,  in the last 168 hours  Radiological Exams on Admission: Dg Chest 2 View  02/16/2013   *RADIOLOGY REPORT*  Clinical Data: Shortness of breath.  History COPD.  Hypertension. Diabetic.  Question CHF.  CHEST - 2 VIEW  Comparison: 02/01/2013  Findings: Lateral view degraded by patient arm position.  Midline trachea.  Mild cardiomegaly. Mediastinal contours otherwise within normal limits.  Suspicion of trace bilateral pleural fluid, most apparent on the lateral view. No pneumothorax.  Interstitial thickening which is increased.  Suggestion of concurrent interlobular septal thickening, most apparent at the right lung base. No lobar consolidation.  IMPRESSION: Findings suspicious for pulmonary venous congestion, superimposed upon chronic bronchitis/COPD.  Trace bilateral pleural effusions.   Original Report Authenticated By: Jeronimo Greaves, M.D.    EKG: Independently reviewed.  Assessment/Plan Principal Problem:   Acute CHF Active Problems:   DM   COPD   1. Acute CHF - mild exacerbation but is likely enough given her poor baseline pulmonary function.   Diuresing patient with Q12H lasix.  Have started patient on  very low dose of ACEI since it does not appear she was previously on this.  Tele monitor, 2d Echo ordered, serial trops ordered.  Some component of this may also be due to worsening of abdominal ascities as well given her history of cirrhosis. 2. DM - putting patient on half home lantus dose and adding resistant scale SSI. 3. COPD - severe and O2 dependant at baseline, however dont feel that this is in exacerbation at this point and feel that her breathing worsening is probably more related to CHF.  No productive cough, no wheezing, and positive CXR findings and worsening peripheral edema are more suggestive of CHF.    Code Status: Full Code (must indicate code status--if unknown or must be presumed, indicate so) Family Communication: Spoke with husband at bedside (indicate person spoken with, if applicable, with phone number if by telephone) Disposition Plan: Admit to inpatient (indicate anticipated LOS)  Time spent: 70 min  GARDNER, JARED M. [facility] Pager [phone]  If 7PM-7AM, please contact night-coverage [email or web address] Password [facility] 02/16/2013, 9:17 PM

## 2013-02-16 NOTE — ED Notes (Signed)
Pt reports she feels better after breathing tx, states that she feels like she can breath a little better

## 2013-02-16 NOTE — ED Notes (Signed)
Pt ambulated to bathroom and back on 3L.  Pt needed significant assistance ambulating and declined to 89% before returning to bed.  RN and PA made aware.  Pt currently resting comfortably and 02 sat returned to 100%

## 2013-02-16 NOTE — ED Notes (Signed)
Admitting at bedside 

## 2013-02-16 NOTE — ED Notes (Signed)
Attempted report x1. 

## 2013-02-16 NOTE — Telephone Encounter (Signed)
Marcelino Duster from [facility] called to give update on walking the pt on her oxygen.  sats with 5 liters pulsing with ambulation  88%.   Marcelino Duster is aware that pt should be on cont while at home.  She walked her for about 1 min on 3 liters cont.  sats  88-89% with HR 68.   Pt rested and was walked back with sats 88-89% with HR 68.  Marcelino Duster stated that the pts starting HR was in the 50's.  i spoke with the pt and she stated that she is having some SOB but not too bad. Her concern is that she is feeling weak again and has noticed some blood in her stool again.  Since pt was d/c from the hospital and had been given transfusions while in the hospital, michelle and I advised her that she will need to go back to the ER for eval of these issues.  Pt voiced her understanding and will go to the ER for eval.    Marcelino Duster is aware that i will pass this info along to SN to make him aware of the walk that she did with the pt today.

## 2013-02-16 NOTE — ED Notes (Signed)
EDP at bedside  

## 2013-02-16 NOTE — ED Provider Notes (Signed)
History    CSN: [id number] Arrival date & time 02/16/13  1615  First MD Initiated Contact with Patient 02/16/13 1833     Chief Complaint  Patient presents with  . Shortness of Breath   (Consider location/radiation/quality/duration/timing/severity/associated sxs/prior Treatment) HPI Comments: Patient with history of congestive heart failure, COPD, GI bleeding due to AVMs -- presents with complaint of shortness of breath for the past 4 days which is much worse with exertion. Patient states that with any walking around her house and becomes extremely short of breath. She sleeps on 3 pillows at night which has not changed and she denies new lower extremity edema. Today she had desats into upper 80s with ambulation while on 3 L nasal cannula. She was encouraged to come to the emergency department for evaluation. Patient denies fever, chest pain, nausea, vomiting, abdominal pain, urinary symptoms. She continues to have black stools which is at her baseline. Onset of symptoms gradual. Course is gradually worsening. Nothing makes symptoms better.  The history is provided by the patient, medical records and the spouse.   Past Medical History  Diagnosis Date  . Chronic airway obstruction, not elsewhere classified   . Unspecified chronic bronchitis   . Unspecified essential hypertension   . Coronary atherosclerosis of unspecified type of vessel, native or graft   . Palpitations   . Aortic valve disorders   . Pure hypercholesterolemia   . Morbid obesity   . Esophageal reflux   . Angiodysplasia of intestine (without mention of hemorrhage)   . Diverticulosis of colon (without mention of hemorrhage)   . Benign neoplasm of colon   . Autoimmune hepatitis   . Cystitis, unspecified   . Osteoarthrosis, unspecified whether generalized or localized, unspecified site   . Osteoporosis, unspecified   . Restless legs syndrome (RLS)   . Major depressive disorder, recurrent episode, severe, specified as  with psychotic behavior   . Iron deficiency anemia secondary to blood loss (chronic)   . Coarse tremors     "just in my arms"  . Carpal tunnel syndrome on both sides   . Dysrhythmia     palpitations  . CHF (congestive heart failure)   . Asthma   . Shortness of breath     "any time really"  . Sleep apnea     "but I don't use the equipment"  . Type II diabetes mellitus   . Blood transfusion   . H/O hiatal hernia   . 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 Adenomatous colon polyp    Past Surgical History  Procedure Laterality Date  . Hemorroidectomy    . Appendectomy    . Vaginal hysterectomy    . Breast biopsy      bilateral  . Cesarean section      x 3  . Appendectomy    . Esophagogastroduodenoscopy  06/12/2012    Procedure: ESOPHAGOGASTRODUODENOSCOPY (EGD);  Surgeon: Hart Carwin, MD;  Location: [facility];  Service: Endoscopy;  Laterality: N/A;  . Balloon dilation  06/12/2012    Procedure: BALLOON DILATION;  Surgeon: Hart Carwin, MD;  Location: [facility];  Service: Endoscopy;  Laterality: N/A;  ?balloon  . Esophagogastroduodenoscopy  09/10/2012    Procedure: ESOPHAGOGASTRODUODENOSCOPY (EGD);  Surgeon: Hart Carwin, MD;  Location: [facility];  Service: Endoscopy;  Laterality: N/A;  . Hot hemostasis  09/10/2012    Procedure: HOT HEMOSTASIS (ARGON PLASMA COAGULATION/BICAP);  Surgeon: Hart Carwin, MD;  Location: [facility];  Service: Endoscopy;  Laterality: N/A;  . Esophagogastroduodenoscopy N/A 01/18/2013    Procedure: ESOPHAGOGASTRODUODENOSCOPY (EGD);  Surgeon: Hart Carwin, MD;  Location[facility];  Service: Endoscopy;  Laterality: N/A;   Family History  Problem Relation Age of Onset  . Heart disease Mother   . Breast cancer Sister   . Cervical cancer Mother   . Diabetes Father   . Colon cancer Neg Hx   . Breast cancer Maternal Aunt     x 2  . Diabetes Father   . Diabetes Sister   . Kidney disease Mother    History  Substance Use Topics  . Smoking  status: Former Smoker -- 1.50 packs/day for 25 years    Types: Cigarettes    Quit date: 08/26/1992  . Smokeless tobacco: Never Used  . Alcohol Use: Yes     Comment: 11/22/11 "last alcoholic drink was years ago"   OB History   Grav Para Term Preterm Abortions TAB SAB Ect Mult Living                 Review of Systems  Constitutional: Negative for fever.  HENT: Negative for sore throat and rhinorrhea.   Eyes: Negative for redness.  Respiratory: Positive for cough and shortness of breath. Negative for wheezing.   Cardiovascular: Negative for chest pain, palpitations and leg swelling.  Gastrointestinal: Negative for nausea, vomiting, abdominal pain and diarrhea.  Genitourinary: Negative for dysuria.  Musculoskeletal: Negative for myalgias.  Skin: Negative for rash.  Neurological: Negative for headaches.    Allergies  Aspirin; Penicillins; and Theophylline  Home Medications   Current Outpatient Rx  Name  Route  Sig  Dispense  Refill  . albuterol (PROAIR HFA) 108 (90 BASE) MCG/ACT inhaler   Inhalation   Inhale 2 puffs into the lungs every 6 (six) hours as needed for shortness of breath.          . ALPRAZolam (XANAX) 1 MG tablet   Oral   Take 1 mg by mouth 2 (two) times daily as needed for anxiety.          . Armodafinil (NUVIGIL) 150 MG tablet   Oral   Take 150 mg by mouth daily.         [name] atenolol (TENORMIN) 50 MG tablet   Oral   Take 50 mg by mouth daily.         [name] b complex vitamins tablet   Oral   Take 1 tablet by mouth daily.          [name] buPROPion (WELLBUTRIN XL) 150 MG 24 hr tablet   Oral   Take 150 mg by mouth daily.         . carbidopa-levodopa (SINEMET CR) 25-100 MG per tablet   Oral   Take 1 tablet by mouth 2 (two) times daily. Take with lunch and dinner         . desvenlafaxine (PRISTIQ) 100 MG 24 hr tablet   Oral   Take 100 mg by mouth daily.         [name] dextromethorphan-guaiFENesin (MUCINEX DM) 30-600 MG per 12 hr tablet   Oral    Take 1-2 tablets by mouth every 12 (twelve) hours.          . Fluticasone-Salmeterol (ADVAIR DISKUS) 250-50 MCG/DOSE AEPB   Inhalation   Inhale 1 puff into the lungs 2 (two) times daily.   1 each   0   . furosemide (LASIX) 40 MG tablet   Oral   Take  1 tablet (40 mg total) by mouth daily.   30 tablet   6   . hydrocortisone cream (NEOSPORIN ECZEMA ESSENT MAX ST) 1 %   Topical   Apply 1 application topically at bedtime. For rash on back and shoulders         . insulin glargine (LANTUS) 100 UNIT/ML injection   Subcutaneous   Inject 60 Units into the skin daily.          . Multiple Vitamin (MULTIVITAMIN WITH MINERALS) TABS   Oral   Take 1 tablet by mouth daily. Centrum Silver         . pantoprazole (PROTONIX) 40 MG tablet   Oral   Take 40 mg by mouth daily.         . polyethylene glycol (MIRALAX / GLYCOLAX) packet   Oral   Take 17 g by mouth daily as needed (constipation).         . potassium chloride SA (K-DUR,KLOR-CON) 10 MEQ tablet   Oral   Take 1 tablet (10 mEq total) by mouth every other day.   30 tablet   1   . promethazine (PHENERGAN) 25 MG tablet   Oral   Take 25 mg by mouth 2 (two) times daily as needed for nausea.         [name] QUEtiapine (SEROQUEL) 100 MG tablet   Oral   Take 400 mg by mouth at bedtime.          [name] rOPINIRole (REQUIP) 4 MG tablet   Oral   Take 4 mg by mouth at bedtime.         . Saline (OCEAN NASAL SPRAY NA)   Nasal   Place 1 spray into the nose daily as needed (congestion).         . Simethicone (GAS-X PO)   Oral   Take 1 tablet by mouth daily. Takes after the biggest meal of the day         . simvastatin (ZOCOR) 40 MG tablet   Oral   Take 40 mg by mouth at bedtime.         . Vitamin D, Ergocalciferol, (DRISDOL) 50000 UNITS CAPS   Oral   Take 1 capsule (50,000 Units total) by mouth every 7 (seven) days. Take on friday   5 capsule   11   . B-D ULTRAFINE III SHORT PEN 31G X 8 MM MISC      USE AS  DIRECTED   100 each   3    BP 175/54  Pulse 59  Temp(Src) 98.9 F (37.2 C) (Oral)  Resp 22  SpO2 97% Physical Exam  Nursing note and vitals reviewed. Constitutional: She appears well-developed and well-nourished.  HENT:  Head: Normocephalic and atraumatic.  Eyes: Conjunctivae are normal. Right eye exhibits no discharge. Left eye exhibits no discharge.  Neck: Normal range of motion. Neck supple.  Cardiovascular: Normal rate, regular rhythm and normal heart sounds.   Pulmonary/Chest: Breath sounds normal. No accessory muscle usage. Tachypnea noted. No respiratory distress.  Abdominal: Soft. There is no tenderness.  Neurological: She is alert.  Skin: Skin is warm and dry.  Psychiatric: She has a normal mood and affect.    ED Course  Procedures (including critical care time) Labs Reviewed  PRO B NATRIURETIC PEPTIDE - Abnormal; Notable for the following:    Pro B Natriuretic peptide (BNP) 351.8 (*)    All other components within normal limits  CBC WITH DIFFERENTIAL - Abnormal; Notable for the  following:    RBC 3.10 (*)    Hemoglobin 9.2 (*)    HCT 28.8 (*)    RDW 17.1 (*)    Platelets 84 (*)    Neutrophils Relative % 78 (*)    Lymphocytes Relative 10 (*)    Lymphs Abs 0.4 (*)    All other components within normal limits  COMPREHENSIVE METABOLIC PANEL - Abnormal; Notable for the following:    Glucose, Bld 311 (*)    Albumin 3.1 (*)    AST 60 (*)    ALT 41 (*)    Alkaline Phosphatase 151 (*)    GFR calc non Af Amer 66 (*)    GFR calc Af Amer 77 (*)    All other components within normal limits  POCT I-STAT TROPONIN I   Dg Chest 2 View  02/16/2013   *RADIOLOGY REPORT*  Clinical Data: Shortness of breath.  History COPD.  Hypertension. Diabetic.  Question CHF.  CHEST - 2 VIEW  Comparison: 02/01/2013  Findings: Lateral view degraded by patient arm position.  Midline trachea.  Mild cardiomegaly. Mediastinal contours otherwise within normal limits.  Suspicion of trace  bilateral pleural fluid, most apparent on the lateral view. No pneumothorax.  Interstitial thickening which is increased.  Suggestion of concurrent interlobular septal thickening, most apparent at the right lung base. No lobar consolidation.  IMPRESSION: Findings suspicious for pulmonary venous congestion, superimposed upon chronic bronchitis/COPD.  Trace bilateral pleural effusions.   Original Report Authenticated By: Jeronimo Greaves, M.D.   1. Shortness of breath   2. Pulmonary congestion   3. Blood loss anemia     6:43 PM Patient seen and examined. Work-up initiated. Medications ordered.   Vital signs reviewed and are as follows: Filed Vitals:   02/16/13 1625  BP: 175/54  Pulse: 59  Temp: 98.9 F (37.2 C)  Resp: 22    Date: 02/16/2013  Rate: 61  Rhythm: normal sinus rhythm  QRS Axis: normal  Intervals: PR prolonged  ST/T Wave abnormalities: normal  Conduction Disutrbances: 1st degree block  Narrative Interpretation:   Old EKG Reviewed: unchanged from 02/01/13  7:22 PM Patient desat to 89% with ambulation.   8:17 PM Patient d/w and seen by Dr. Manus Gunning.   Spoke with Dr. Julian Reil with [facility] who will admit.     MDM  Admit for SOB in setting of COPD and CHF. Hgb is stable today.    Renne Crigler, PA-C 02/16/13 2019

## 2013-02-16 NOTE — Telephone Encounter (Signed)
Patient given Dr. Brodie's recommendation. 

## 2013-02-16 NOTE — Telephone Encounter (Signed)
I spoke with pt. She stated she can't breathe and is very SOB when she walks. She is wearing 4 liters O2 24/7. She is requesting to be seen sooner than 02/19/23. Nothing available. Pt wants to be worked in. Please advise SN thanks Last OV 12/30/12 03/29/13 PENDING  Allergies  Allergen Reactions  . Aspirin Other (See Comments)    Causes nosebleeds  . Penicillins Itching  . Theophylline Nausea And Vomiting

## 2013-02-16 NOTE — ED Notes (Signed)
RT at bedside.

## 2013-02-16 NOTE — ED Provider Notes (Signed)
Medical screening examination/treatment/procedure(s) were conducted as a shared visit with non-physician practitioner(s) and myself.  I personally evaluated the patient during the encounter  Home O2, worsening SOB x 3 days, DOE. No chest pain.  Difficulty getting around house. No distress. Lungs clear. Hypoxic on home O2 with ambulation.  Glynn Octave, MD 02/16/13 [phone]

## 2013-02-16 NOTE — ED Notes (Signed)
Pt is here with sob that has been increasing with exertion and pt just discharged from hospital 2 weeks ago.  History of chf/copd and home 02 dependent

## 2013-02-17 DIAGNOSIS — I5033 Acute on chronic diastolic (congestive) heart failure: Principal | ICD-10-CM

## 2013-02-17 DIAGNOSIS — K5520 Angiodysplasia of colon without hemorrhage: Secondary | ICD-10-CM

## 2013-02-17 DIAGNOSIS — D509 Iron deficiency anemia, unspecified: Secondary | ICD-10-CM

## 2013-02-17 LAB — GLUCOSE, CAPILLARY: Glucose-Capillary: 245 mg/dL — ABNORMAL HIGH (ref 70–99)

## 2013-02-17 LAB — RAPID URINE DRUG SCREEN, HOSP PERFORMED
Barbiturates: NOT DETECTED
Benzodiazepines: POSITIVE — AB
Cocaine: NOT DETECTED
Opiates: NOT DETECTED

## 2013-02-17 LAB — BASIC METABOLIC PANEL
BUN: 16 mg/dL (ref 6–23)
Calcium: 8.6 mg/dL (ref 8.4–10.5)
GFR calc Af Amer: 66 mL/min — ABNORMAL LOW (ref >90)
GFR calc non Af Amer: 57 mL/min — ABNORMAL LOW (ref >90)
Potassium: 3.5 mEq/L (ref 3.5–5.1)
Sodium: 138 mEq/L (ref 135–145)

## 2013-02-17 LAB — TROPONIN I: Troponin I: <0.3 ng/mL (ref <0.30)

## 2013-02-17 NOTE — Progress Notes (Signed)
[facility] PROGRESS NOTE  [name]:[id number] DOB: Aug 20, 1941 DOA: 02/16/2013 PCP: Michele Mcalpine, MD  Assessment/Plan: Acute on chronic diastolic CHF -Continue furosemide IV -Await echocardiogram -Monitor I/Os COPD -Stable -Normally on home 3 L nasal cannula -No wheezing at this time -Continue long-acting beta agonist Diabetes mellitus type 2 -Continue half home dose of Lantus -NovoLog sliding scale  thrombocytopenia -Abdominal ultrasound on 02/02/2013 suggests cirrhosis with splenomegaly -Has been chronic -No active bleeding at this time Major depression -Stable    Family Communication:   Husband at beside Disposition Plan:   Home when medically stable      Procedures/Studies: Dg Chest 2 View  02/16/2013   *RADIOLOGY REPORT*  Clinical Data: Shortness of breath.  History COPD.  Hypertension. Diabetic.  Question CHF.  CHEST - 2 VIEW  Comparison: 02/01/2013  Findings: Lateral view degraded by patient arm position.  Midline trachea.  Mild cardiomegaly. Mediastinal contours otherwise within normal limits.  Suspicion of trace bilateral pleural fluid, most apparent on the lateral view. No pneumothorax.  Interstitial thickening which is increased.  Suggestion of concurrent interlobular septal thickening, most apparent at the right lung base. No lobar consolidation.  IMPRESSION: Findings suspicious for pulmonary venous congestion, superimposed upon chronic bronchitis/COPD.  Trace bilateral pleural effusions.   Original Report Authenticated By: Jeronimo Greaves, M.D.   Dg Chest 2 View  02/01/2013   *RADIOLOGY REPORT*  Clinical Data: Shortness of breath, cough, COPD, asthma  CHEST - 2 VIEW  Comparison: 10/15/2012  Findings: Chronic interstitial markings.  Mild left upper lobe scarring.  No focal consolidation. No pleural effusion or pneumothorax.  The heart is top normal in size.  Mild degenerative changes of the visualized thoracolumbar spine.  IMPRESSION: No evidence of  acute cardiopulmonary disease.   Original Report Authenticated By: Charline Bills, M.D.   Dg Abd 1 View  02/02/2013   *RADIOLOGY REPORT*  Clinical Data: Nausea and vomiting.  ABDOMEN - 1 VIEW  Comparison: CT abdomen and pelvis 06/02/2007  Findings: Gas and stool in the colon.  No small or large bowel distension.  No radiopaque stones.  Visualized bones appear intact.  IMPRESSION: Nonobstructive bowel gas pattern.   Original Report Authenticated By: Burman Nieves, M.D.   US Abdomen Complete  02/02/2013   *RADIOLOGY REPORT*  Clinical Data:  Nausea, vomiting.  Morbid obesity.  History of cirrhosis, diabetes mellitus type 2.  COMPLETE ABDOMINAL ULTRASOUND  Comparison:  Ultrasound the abdomen 09/07/2012  Findings:  Gallbladder:  Gallbladder is distended.  Gallbladder wall is 3.3 mm maximum thickness.  No stones are identified.  No sonographic Murphy's sign.  Common bile duct:  6.2 mm  Liver:  The liver is scalloped, dense, and heterogeneous, consistent with the history of cirrhosis. No focal mass.  IVC:  Appears normal.  Pancreas:  No focal abnormality seen.  Spleen:  Spleen is enlarged, 14.0 cm in cranial caudal length, 939 ml in volume.  A simple cyst is 1.6 x 1.5 x 1.5 cm in the upper region.  Right Kidney:  11.4 cm in length, normal appearance.  Left Kidney:  11.1 cm in length, normal appearance.  Abdominal aorta:  Distal abdominal aorta is obscured by bowel gas. No aortic aneurysm identified.  Maximum diameter, 2.5 cm.  IMPRESSION:  1.  Mildly thickened wall of the gallbladder, a nonspecific finding often associated with cirrhosis.  No other evidence for acute cholecystitis. 2.  Splenomegaly and cirrhosis. 3.  Normal appearance of the kidneys.   Original Report Authenticated By: Norva Pavlov, M.D.  Subjective: Patient states that she is breathing 40% better. Denies any chest pain, nausea, vomiting, diarrhea, abdominal pain, dysuria, hematuria. No fevers or chills.  Objective: Filed  Vitals:   02/17/13 0433 02/17/13 1112 02/17/13 1143 02/17/13 1430  BP: 128/51 137/46  130/47  Pulse: 52 57  54  Temp: 98.5 F (36.9 C) 98.6 F (37 C)  97.4 F (36.3 C)  TempSrc: Oral Oral  Oral  Resp: 19 20  18   Height:      Weight: 98.8 kg (217 lb 13 oz)     SpO2: 99% 97% 98% 99%    Intake/Output Summary (Last 24 hours) at 02/17/13 1859 Last data filed at 02/17/13 1823  Gross per 24 hour  Intake    703 ml  Output   2975 ml  Net  -2272 ml   Weight change:  Exam:   General:  Pt is alert, follows commands appropriately, not in acute distress  HEENT: No icterus, No thrush,  [place]/AT  Cardiovascular: RRR, S1/S2, no rubs, no gallops  Respiratory: Diminished breath sounds at the bases but clear to auscultation. No wheezes or rhonchi. Good air movement.  Abdomen: Soft/+BS, non tender, non distended, no guarding  Extremities: 1+ LE edema, No lymphangitis, No petechiae, No rashes, no synovitis  Data Reviewed: Basic Metabolic Panel:  Recent Labs Lab 02/16/13 1629 02/17/13 0440  NA 136 138  K 3.7 3.5  CL 99 101  CO2 29 27  GLUCOSE 311* 241*  BUN 15 16  CREATININE 0.87 0.99  CALCIUM 8.9 8.6   Liver Function Tests:  Recent Labs Lab 02/16/13 1629  AST 60*  ALT 41*  ALKPHOS 151*  BILITOT 0.9  PROT 6.4  ALBUMIN 3.1*   No results found for this basename: LIPASE, AMYLASE,  in the last 168 hours No results found for this basename: AMMONIA,  in the last 168 hours CBC:  Recent Labs Lab 02/16/13 1629  WBC 4.4  NEUTROABS 3.4  HGB 9.2*  HCT 28.8*  MCV 92.9  PLT 84*   Cardiac Enzymes:  Recent Labs Lab 02/16/13 2253 02/17/13 0440 02/17/13 1026  TROPONINI <0.30 <0.30 <0.30   BNP: No components found with this basename: POCBNP,  CBG:  Recent Labs Lab 02/16/13 2219 02/17/13 0633 02/17/13 1100 02/17/13 1626  GLUCAP 197* 178* 240* 245*    No results found for this or any previous visit (from the past 240 hour(s)).   Scheduled Meds: . Armodafinil   150 mg Oral Daily  . atenolol  50 mg Oral Daily  . buPROPion  150 mg Oral Daily  . carbidopa-levodopa  1 tablet Oral BID  . desvenlafaxine  100 mg Oral Daily  . dextromethorphan-guaiFENesin  1-2 tablet Oral Q12H  . furosemide  40 mg Intravenous Q12H  . insulin aspart  0-20 Units Subcutaneous TID WC  . insulin glargine  30 Units Subcutaneous Daily  . lisinopril  5 mg Oral Daily  . mometasone-formoterol  2 puff Inhalation BID  . pantoprazole  40 mg Oral Daily  . potassium chloride SA  10 mEq Oral QODAY  . QUEtiapine  400 mg Oral QHS  . rOPINIRole  4 mg Oral QHS  . simvastatin  40 mg Oral QHS  . sodium chloride  3 mL Intravenous Q12H   Continuous Infusions:    Jazara Swiney, DO  [facility] Pager [phone]  If 7PM-7AM, please contact night-coverage [email or web address] Password [email or web address] 02/17/2013, 6:59 PM   LOS: 1 day

## 2013-02-17 NOTE — Progress Notes (Signed)
Chaplain visited pt in response to a spiritual care consult. Husband was visiting during this time of visit. Pt is waiting for doctor's prognosis. Chaplain listened empathically to pt's story, shared words of hope, encouragement and prayed with pt and husband at the bedside. Chaplain will follow up on pt the next day. Pt and husband thanked chaplain for visit. Kelle Darting [phone]  02/17/13 1710  Clinical Encounter Type  Visited With Patient and family together  Visit Type Initial;Spiritual support  Referral From Nurse  Consult/Referral To Chaplain  Spiritual Encounters  Spiritual Needs Prayer  Stress Factors  Patient Stress Factors Exhausted;Health changes  Family Stress Factors None identified

## 2013-02-17 NOTE — Care Management Note (Signed)
    Page 1 of 2   02/17/2013     1:39:03 PM   CARE MANAGEMENT NOTE 02/17/2013  Patient:  [name], [name]   Account Number:  [id number]  Date Initiated:  02/17/2013  Documentation initiated by:  [facility]  Subjective/Objective Assessment:   72 y.o. female with h/o severe COPD 3L home O2 at baseline, who presents to the ED with c/o worsening SOB especially with activity at home.  This is associated with dry non-productive cough.     Action/Plan:   Diurese/ home with [facility]   Anticipated DC Date:  02/20/2013   Anticipated DC Plan:  HOME W HOME HEALTH SERVICES      DC Planning Services  CM consult      [facility] Choice  HOME HEALTH   Choice offered to / List presented to:  C-1 Patient        HH arranged  HH-1 RN  HH-10 DISEASE MANAGEMENT      HH agency  [facility]   Status of service:  Completed, signed off Medicare Important Message given?   (If response is "NO", the following Medicare IM given date fields will be blank) Date Medicare IM given:   Date Additional Medicare IM given:    Discharge Disposition:    Per UR Regulation:    If discussed at Long Length of Stay Meetings, dates discussed:    Comments:  02/17/13@1130 .Marland KitchenMarland KitchenOletta Cohn, RN, BSN, [employer] [phone] Spoke with pt at bedside regarding discharge planning.  Pt is currently active with [facility] for PT services and has decided to extend RN services for disease management of CHF.  Kizzie Furnish of [facility] notified.

## 2013-02-17 NOTE — Progress Notes (Signed)
[facility]  Patient Status: Active (receiving services up to time of hospitalization)  [facility] is providing the following services: PT, new orders for RN  If patient discharges after hours, please call [phone].   [name] 02/17/2013, 5:01 PM

## 2013-02-17 NOTE — Progress Notes (Signed)
02/16/13 Pt.arrived to unit around 2220. She is A/Ox4 and was transported with 3 L [place] of oxygen. Pt.is dependent on oxygen at home. She can ambulate with 1 person assist. She had no c/o pain during the shift.

## 2013-02-18 ENCOUNTER — Inpatient Hospital Stay (HOSPITAL_COMMUNITY): Payer: Medicare Other

## 2013-02-18 ENCOUNTER — Inpatient Hospital Stay: Payer: Medicare Other | Admitting: Adult Health

## 2013-02-18 DIAGNOSIS — I359 Nonrheumatic aortic valve disorder, unspecified: Secondary | ICD-10-CM

## 2013-02-18 LAB — BASIC METABOLIC PANEL
CO2: 26 mEq/L (ref 19–32)
Calcium: 8.3 mg/dL — ABNORMAL LOW (ref 8.4–10.5)
Chloride: 101 mEq/L (ref 96–112)
Glucose, Bld: 279 mg/dL — ABNORMAL HIGH (ref 70–99)
Sodium: 137 mEq/L (ref 135–145)

## 2013-02-18 LAB — GLUCOSE, CAPILLARY
Glucose-Capillary: 261 mg/dL — ABNORMAL HIGH (ref 70–99)
Glucose-Capillary: 238 mg/dL — ABNORMAL HIGH (ref 70–99)
Glucose-Capillary: 179 mg/dL — ABNORMAL HIGH (ref 70–99)

## 2013-02-18 MED ORDER — INSULIN GLARGINE 100 UNIT/ML ~~LOC~~ SOLN
40.0000 [IU] | Freq: Every day | SUBCUTANEOUS | Status: DC
  Administered 2013-02-19: 40 [IU] via SUBCUTANEOUS
  Filled 2013-02-18: qty 0.4

## 2013-02-18 NOTE — Progress Notes (Signed)
Inpatient Diabetes Program Recommendations  AACE/ADA: New Consensus Statement on Inpatient Glycemic Control (2013)  Target Ranges:  Prepandial:   less than 140 mg/dL      Peak postprandial:   less than 180 mg/dL (1-2 hours)      Critically ill patients:  140 - 180 mg/dL  Results for LYRIQ, FINERTY (MRN [id number]) as of 02/18/2013 13:03  Ref. Range 02/17/2013 11:00 02/17/2013 16:26 02/17/2013 22:09 02/18/2013 05:58 02/18/2013 11:22  Glucose-Capillary Latest Range: 70-99 mg/dL 409 (H) 811 (H) 914 (H) 261 (H) 238 (H)   Inpatient Diabetes Program Recommendations Insulin - Basal: increase Lantus to 40 units  Patient takes Lantus 60 units at home  Thank you  Piedad Climes BSN, RN,CDE Inpatient Diabetes Coordinator [phone] (team pager)

## 2013-02-18 NOTE — Progress Notes (Signed)
  Echocardiogram 2D Echocardiogram has been performed.  Georgian Co 02/18/2013, 3:44 PM

## 2013-02-18 NOTE — Progress Notes (Signed)
[facility] PROGRESS NOTE  RAYN SHORB AVW:[id number] DOB: 01/18/1941 DOA: 02/16/2013 PCP: Michele Mcalpine, MD  Assessment/Plan: Acute on chronic diastolic CHF  -Continue furosemide IV  -neg 1922cc for last 24 hrs/ neg 3617cc for the admission -echocardiogram EF 55-60%, grade 2 diastolic dysfunction, mild AS, AI -Monitor I/Os  -Fluid restrict 1200 cc daily -Repeat chest x-ray shows improved vascular congestion -Suspect that patient's continued dyspnea may be multifactorial including COPD, deconditioning, and CHF -PT evaluation -Discontinue Nuvigil -TSH COPD  -Stable  -Normally on home 3 L nasal cannula  -No wheezing at this time  -Continue long-acting beta agonist  Diabetes mellitus type 2  -Increase Lantus to 40 units  -NovoLog sliding scale  -HbA1C thrombocytopenia  -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  The degree of vascular congestion has improved in the interval from prior exam. Some linear atelectasis is noted on the left.  No sizable effusion is seen.  No bony abnormality is noted.  IMPRESSION: Improved aeration from prior exam.   Original Report Authenticated By: Alcide Clever, M.D.   Dg Chest 2 View  02/16/2013   *RADIOLOGY REPORT*  Clinical Data: Shortness of breath.  History COPD.  Hypertension. Diabetic.  Question CHF.  CHEST - 2 VIEW  Comparison: 02/01/2013  Findings: Lateral view degraded by patient arm position.  Midline trachea.  Mild cardiomegaly. Mediastinal contours otherwise within normal limits.  Suspicion of trace bilateral  pleural fluid, most apparent on the lateral view. No pneumothorax.  Interstitial thickening which is increased.  Suggestion of concurrent interlobular septal thickening, most apparent at the right lung base. No lobar consolidation.  IMPRESSION: Findings suspicious for pulmonary venous congestion, superimposed upon chronic bronchitis/COPD.  Trace bilateral pleural effusions.   Original Report Authenticated By: Jeronimo Greaves, M.D.   Dg Chest 2 View  02/01/2013   *RADIOLOGY REPORT*  Clinical Data: Shortness of breath, cough, COPD, asthma  CHEST - 2 VIEW  Comparison: 10/15/2012  Findings: Chronic interstitial markings.  Mild left upper lobe scarring.  No focal consolidation. No pleural effusion or pneumothorax.  The heart is top normal in size.  Mild degenerative changes of the visualized thoracolumbar spine.  IMPRESSION: No evidence of acute cardiopulmonary disease.   Original Report Authenticated By: Charline Bills, M.D.   Dg Abd 1 View  02/02/2013   *RADIOLOGY REPORT*  Clinical Data: Nausea and vomiting.  ABDOMEN - 1 VIEW  Comparison: CT abdomen and pelvis 06/02/2007  Findings: Gas and stool in the colon.  No small or large bowel distension.  No radiopaque stones.  Visualized bones appear intact.  IMPRESSION: Nonobstructive bowel gas pattern.   Original Report Authenticated By: Burman Nieves, M.D.   US Abdomen Complete  02/02/2013   *RADIOLOGY REPORT*  Clinical Data:  Nausea, vomiting.  Morbid obesity.  History of cirrhosis, diabetes mellitus type 2.  COMPLETE ABDOMINAL ULTRASOUND  Comparison:  Ultrasound the abdomen 09/07/2012  Findings:  Gallbladder:  Gallbladder is distended.  Gallbladder wall is 3.3 mm maximum thickness.  No stones are identified.  No sonographic Murphy's sign.  Common bile  duct:  6.2 mm  Liver:  The liver is scalloped, dense, and heterogeneous, consistent with the history of cirrhosis. No focal mass.  IVC:  Appears normal.  Pancreas:  No focal abnormality seen.  Spleen:  Spleen is  enlarged, 14.0 cm in cranial caudal length, 939 ml in volume.  A simple cyst is 1.6 x 1.5 x 1.5 cm in the upper region.  Right Kidney:  11.4 cm in length, normal appearance.  Left Kidney:  11.1 cm in length, normal appearance.  Abdominal aorta:  Distal abdominal aorta is obscured by bowel gas. No aortic aneurysm identified.  Maximum diameter, 2.5 cm.  IMPRESSION:  1.  Mildly thickened wall of the gallbladder, a nonspecific finding often associated with cirrhosis.  No other evidence for acute cholecystitis. 2.  Splenomegaly and cirrhosis. 3.  Normal appearance of the kidneys.   Original Report Authenticated By: Norva Pavlov, M.D.         Subjective:  patient states her breathing is not much better than yesterday. Denies any fevers, chills, chest discomfort, shortness of breath at rest, nausea, vomiting, diarrhea, abdominal pain. She does still have dyspnea on exertion.   Objective: Filed Vitals:   02/17/13 2210 02/18/13 0452 02/18/13 0938 02/18/13 1350  BP: 141/40 95/39 132/60 147/40  Pulse: 58 50 56 57  Temp: 98.8 F (37.1 C) 98.1 F (36.7 C) 97.5 F (36.4 C) 98.2 F (36.8 C)  TempSrc: Oral Oral Oral Oral  Resp: 18 19 20 18   Height:      Weight:  98.975 kg (218 lb 3.2 oz)    SpO2: 96% 98% 98% 95%    Intake/Output Summary (Last 24 hours) at 02/18/13 1713 Last data filed at 02/18/13 1617  Gross per 24 hour  Intake   1305 ml  Output   2850 ml  Net  -1545 ml   Weight change: 0.272 kg (9.6 oz) Exam:   General:  Pt is alert, follows commands appropriately, not in acute distress  HEENT: No icterus, No thrush,  [place]/AT  Cardiovascular: RRR, S1/S2, no rubs, no gallops  Respiratory: CTA bilaterally, no wheezing, no crackles, no rhonchi  Abdomen: Soft/+BS, non tender, non distended, no guarding  Extremities: No edema, No lymphangitis, No petechiae, No rashes, no synovitis  Data Reviewed: Basic Metabolic Panel:  Recent Labs Lab 02/16/13 1629 02/17/13 0440 02/18/13 0532   NA 136 138 137  K 3.7 3.5 3.5  CL 99 101 101  CO2 29 27 26   GLUCOSE 311* 241* 279*  BUN 15 16 19   CREATININE 0.87 0.99 0.97  CALCIUM 8.9 8.6 8.3*  MG  --   --  2.0   Liver Function Tests:  Recent Labs Lab 02/16/13 1629  AST 60*  ALT 41*  ALKPHOS 151*  BILITOT 0.9  PROT 6.4  ALBUMIN 3.1*   No results found for this basename: LIPASE, AMYLASE,  in the last 168 hours No results found for this basename: AMMONIA,  in the last 168 hours CBC:  Recent Labs Lab 02/16/13 1629  WBC 4.4  NEUTROABS 3.4  HGB 9.2*  HCT 28.8*  MCV 92.9  PLT 84*   Cardiac Enzymes:  Recent Labs Lab 02/16/13 2253 02/17/13 0440 02/17/13 1026  TROPONINI <0.30 <0.30 <0.30   BNP: No components found with this basename: POCBNP,  CBG:  Recent Labs Lab 02/17/13 1626 02/17/13 2209 02/18/13 0558 02/18/13 1122 02/18/13 1612  GLUCAP 245* 233* 261* 238* 179*    No results found for this or any previous visit (from  the past 240 hour(s)).   Scheduled Meds: . atenolol  50 mg Oral Daily  . buPROPion  150 mg Oral Daily  . carbidopa-levodopa  1 tablet Oral BID  . desvenlafaxine  100 mg Oral Daily  . dextromethorphan-guaiFENesin  1-2 tablet Oral Q12H  . furosemide  40 mg Intravenous Q12H  . insulin aspart  0-20 Units Subcutaneous TID WC  . [START ON 02/19/2013] insulin glargine  40 Units Subcutaneous Daily  . lisinopril  5 mg Oral Daily  . mometasone-formoterol  2 puff Inhalation BID  . pantoprazole  40 mg Oral Daily  . potassium chloride SA  10 mEq Oral QODAY  . QUEtiapine  400 mg Oral QHS  . rOPINIRole  4 mg Oral QHS  . simvastatin  40 mg Oral QHS  . sodium chloride  3 mL Intravenous Q12H   Continuous Infusions:    Michaelene Dutan, DO  [facility] Pager [phone]  If 7PM-7AM, please contact night-coverage [email or web address] Password [email or web address] 02/18/2013, 5:13 PM   LOS: 2 days

## 2013-02-19 ENCOUNTER — Telehealth: Payer: Self-pay | Admitting: Pulmonary Disease

## 2013-02-19 DIAGNOSIS — J441 Chronic obstructive pulmonary disease with (acute) exacerbation: Secondary | ICD-10-CM

## 2013-02-19 LAB — BASIC METABOLIC PANEL
Calcium: 8.4 mg/dL (ref 8.4–10.5)
GFR calc Af Amer: 62 mL/min — ABNORMAL LOW (ref >90)
GFR calc non Af Amer: 54 mL/min — ABNORMAL LOW (ref >90)
Potassium: 3.5 mEq/L (ref 3.5–5.1)
Sodium: 136 mEq/L (ref 135–145)

## 2013-02-19 LAB — GLUCOSE, CAPILLARY
Glucose-Capillary: 142 mg/dL — ABNORMAL HIGH (ref 70–99)
Glucose-Capillary: 219 mg/dL — ABNORMAL HIGH (ref 70–99)

## 2013-02-19 LAB — HEMOGLOBIN A1C
Hgb A1c MFr Bld: 6.7 % — ABNORMAL HIGH (ref <5.7)
Mean Plasma Glucose: 146 mg/dL — ABNORMAL HIGH (ref <117)

## 2013-02-19 MED ORDER — ATENOLOL 50 MG PO TABS
50.0000 mg | ORAL_TABLET | Freq: Every day | ORAL | Status: DC
  Administered 2013-02-19: 50 mg via ORAL
  Filled 2013-02-19: qty 1

## 2013-02-19 MED ORDER — FUROSEMIDE 20 MG PO TABS: 60.0000 mg | ORAL_TABLET | Freq: Every day | ORAL | Status: DC

## 2013-02-19 MED ORDER — LISINOPRIL 5 MG PO TABS: 5.0000 mg | ORAL_TABLET | Freq: Every day | ORAL | Status: DC

## 2013-02-19 MED ORDER — FUROSEMIDE 40 MG PO TABS: 60.0000 mg | ORAL_TABLET | Freq: Every day | ORAL | Status: DC

## 2013-02-19 NOTE — Progress Notes (Signed)
Pt HR ranges from upper 50's to lower 60's.No c/o chest pains.

## 2013-02-19 NOTE — Clinical Documentation Improvement (Signed)
THIS DOCUMENT IS NOT A PERMANENT PART OF THE MEDICAL RECORD  Please update your documentation in progress note with the medical record to reflect your response to this query.  02/19/13  Dear Dr.Delberta Folts Marton Redwood,  In a better effort to capture your patient's severity of illness, reflect appropriate length of stay and utilization of resources, a review of the patient medical record has revealed the following:    Hypoxia noted by Dr. Manus Gunning in ED  Desats to 89% while ambulating noted by ED PA  Oxygen dependent at home using 3L  History of worsening SOB, DOE, COPD and  Acute on chronic diastolic heart failure.   Based on your clinical judgment, please clarify and document in a progress note and/or discharge summary the clinical condition associated with the following supporting information:  In responding to this query please exercise your independent judgment.  The fact that a query is asked, does not imply that any particular answer is desired or expected.   _______Acute Respiratory Failure _______Acute on Chronic Respiratory Failure _______Chronic Respiratory Failure _______Acute Respiratory Insufficiency _______Acute Respiratory Insufficiency following surgery or trauma _______Other Condition________________ _______Cannot Clinically Determine     You may use likely, probable, or suspect with inpatient documentation. Please document findings on progress note and discharge summary.   Reviewed: additional documentation in the medical record  Thank Lucilla Edin  Clinical Documentation Specialist: [phone] Health Information Management [place]

## 2013-02-19 NOTE — Telephone Encounter (Signed)
Spoke with spouse. Pt appt scheduled. Nothing further was needed

## 2013-02-19 NOTE — Discharge Summary (Addendum)
Physician Discharge Summary  [name] HYQ:[id number] DOB: 18-Aug-1941 DOA: 02/16/2013  PCP: Michele Mcalpine, MD  Admit date: 02/16/2013 Discharge date: 02/19/2013  Recommendations for Outpatient Follow-up:  1. Pt will need to follow up with PCP in 1 weeks post discharge 2. Please obtain BMP to evaluate electrolytes and kidney function    Discharge Diagnoses:  Principal Problem:   Acute CHF Active Problems:   DM   COPD Acute on Chronic Respiratory Failure due to Acute on chronic diastolic CHF  -Initiated furosemide IV 40 mg twice a day -She'll be discharged home with furosemide 60 mg po every morning - neg 6248cc for the admission  -echocardiogram EF 55-60%, grade 2 diastolic dysfunction, mild AS, AI  -The patient continued to clinically improve, but continued to have some dyspnea on exertion -Fluid restrict 1200 cc daily  -Repeat chest x-ray shows improved vascular congestion  -Suspect that patient's continued dyspnea may be multifactorial including COPD, deconditioning, and CHF  -PT evaluation--> was setup prior to patient's discharge -Discontinue Nuvigil  until she follows up with her primary care physician -TSH--1.541 -The patient was provided instructions to weigh herself on a daily basis. 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 female with h/o severe COPD 3L home O2 at baseline, who presents to the ED with c/o worsening SOB especially with activity at home. This is associated with dry non-productive cough. She does note that on Sunday she also had worsening BLE edema. The severity is such that any activity including walking around her house causes her to become more SOB. Nothing seems to make symptoms better. Lying down flat makes symptoms worse.  In ED she desatted into the upper 80s when ambulating but was satting well on 3L at rest. Her BNP was noted to be 351. Work up demonstrated pulmonary vascular congestion suggestive of acute CHF exacerbation. The patient was started on intravenous Lasix 40 mg twice a day. The patient clinically improved. Her leg edema improved. She continues to have some mild dyspnea on exertion, but was significantly better. Echocardiogram was obtained and showed EF 55-60% with grade 2 diastolic dysfunction. Repeat chest x-ray on 02/18/2013 showed improved vascular congestion. The patient was started on lisinopril 5 mg daily. Her BUN/creatinine remained stable. On the day of discharge, her serum creatinine was 1.04. TSH was 1.541. Hemoglobin remained stable at 9.2. There is no signs of bleeding.     Discharge Exam: Filed Vitals:   02/19/13 1518  BP: 142/50  Pulse: 55  Temp: 98.3 F (36.8 C)  Resp: 19   Filed Vitals:  02/19/13 0456 02/19/13 0902 02/19/13 1034 02/19/13 1518  BP: 126/49  139/43 142/50  Pulse: 56  56 55  Temp: 97.8 F (36.6 C)  98.1 F (36.7 C) 98.3 F (36.8 C)  TempSrc: Oral  Oral Oral  Resp: 19  20 19   Height:      Weight: 99.292 kg (218 lb 14.4 oz)     SpO2: 95% 96% 96% 97%   General: A&O x 3, NAD, pleasant,  cooperative Cardiovascular: RRR, no rub, no gallop, no S3 Respiratory: CTAB, no wheeze, no rhonchi Abdomen:soft, nontender, nondistended, positive bowel sounds Extremities: trace edema, No lymphangitis, no petechiae  Discharge Instructions  Discharge Orders   Future Appointments Provider Department Dept Phone   02/25/2013 11:30 AM Michele Mcalpine, MD [facility] [phone]   03/11/2013 3:00 PM Melvyn Novas, MD [facility] [phone]   03/19/2013 2:15 PM Romero Belling, MD [facility] [phone]   03/29/2013 11:00 AM Lorin Picket Elayne Snare, MD [facility] [phone]   Future Orders Complete By Expires     Diet - low sodium heart healthy  As directed     Discharge instructions  As directed     Comments:      Do not take Nuvigil until you follow up with your primary care physician Weigh yourself every day.  If you gain more than 3 pounds on successive days, call your primary care physician and notify him/her.    Increase activity slowly  As directed         Medication List    STOP taking these medications       NUVIGIL 150 MG tablet  Generic drug:  Armodafinil      TAKE these medications       ALPRAZolam 1 MG tablet  Commonly known as:  XANAX  Take 1 mg by mouth 2 (two) times daily as needed for anxiety.     atenolol 50 MG tablet  Commonly known as:  TENORMIN  Take 50 mg by mouth daily.     b complex vitamins tablet  Take 1 tablet by mouth daily.     B-D ULTRAFINE III SHORT PEN 31G X 8 MM Misc  Generic drug:  Insulin Pen Needle  USE AS DIRECTED     buPROPion 150 MG 24 hr tablet  Commonly known as:  WELLBUTRIN XL  Take 150 mg by mouth daily.     carbidopa-levodopa 25-100 MG per tablet  Commonly known as:  SINEMET CR  Take 1 tablet by mouth 2 (two) times daily. Take with lunch and dinner     desvenlafaxine 100 MG 24 hr tablet  Commonly known as:  PRISTIQ  Take 100 mg by mouth daily.      dextromethorphan-guaiFENesin 30-600 MG per 12 hr tablet  Commonly known as:  MUCINEX DM  Take 1-2 tablets by mouth every 12 (twelve) hours.     Fluticasone-Salmeterol 250-50 MCG/DOSE Aepb  Commonly known as:  ADVAIR DISKUS  Inhale 1 puff into the lungs 2 (two) times daily.     furosemide 20 MG tablet  Commonly known as:  LASIX  Take 3 tablets (60 mg total) by mouth daily.  Start taking on:  02/20/2013     GAS-X PO  Take 1 tablet by mouth daily. Takes after the biggest meal of the day     insulin glargine 100 UNIT/ML injection  Commonly known as:  LANTUS  Inject 60 Units into the skin daily.     lisinopril 5  MG tablet  Commonly known as:  PRINIVIL,ZESTRIL  Take 1 tablet (5 mg total) by mouth daily.     multivitamin with minerals Tabs  Take 1 tablet by mouth daily. Centrum Silver     NEOSPORIN ECZEMA ESSENT MAX ST 1 %  Generic drug:  hydrocortisone cream  Apply 1 application topically at bedtime. For rash on back and shoulders     OCEAN NASAL SPRAY NA  Place 1 spray into the nose daily as needed (congestion).     pantoprazole 40 MG tablet  Commonly known as:  PROTONIX  Take 40 mg by mouth daily.     polyethylene glycol packet  Commonly known as:  MIRALAX / GLYCOLAX  Take 17 g by mouth daily as needed (constipation).     potassium chloride 10 MEQ tablet  Commonly known as:  K-DUR,KLOR-CON  Take 1 tablet (10 mEq total) by mouth every other day.     PROAIR HFA 108 (90 BASE) MCG/ACT inhaler  Generic drug:  albuterol  Inhale 2 puffs into the lungs every 6 (six) hours as needed for shortness of breath.     promethazine 25 MG tablet  Commonly known as:  PHENERGAN  Take 25 mg by mouth 2 (two) times daily as needed for nausea.     QUEtiapine 100 MG tablet  Commonly known as:  SEROQUEL  Take 400 mg by mouth at bedtime.     rOPINIRole 4 MG tablet  Commonly known as:  REQUIP  Take 4 mg by mouth at bedtime.     simvastatin 40 MG tablet  Commonly known as:  ZOCOR  Take  40 mg by mouth at bedtime.     Vitamin D (Ergocalciferol) 50000 UNITS Caps  Commonly known as:  DRISDOL  Take 1 capsule (50,000 Units total) by mouth every 7 (seven) days. Take on friday         The results of significant diagnostics from this hospitalization (including imaging, microbiology, ancillary and laboratory) are listed below for reference.    Significant Diagnostic Studies: Dg Chest 2 View  02/18/2013   *RADIOLOGY REPORT*  Clinical Data: Cough and shortness of breath  CHEST - 2 VIEW  Comparison: 02/16/2013  Findings: The cardiac shadow remains enlarged.  The degree of vascular congestion has improved in the interval from prior exam. Some linear atelectasis is noted on the left.  No sizable effusion is seen.  No bony abnormality is noted.  IMPRESSION: Improved aeration from prior exam.   Original Report Authenticated By: Alcide Clever, M.D.   Dg Chest 2 View  02/16/2013   *RADIOLOGY REPORT*  Clinical Data: Shortness of breath.  History COPD.  Hypertension. Diabetic.  Question CHF.  CHEST - 2 VIEW  Comparison: 02/01/2013  Findings: Lateral view degraded by patient arm position.  Midline trachea.  Mild cardiomegaly. Mediastinal contours otherwise within normal limits.  Suspicion of trace bilateral pleural fluid, most apparent on the lateral view. No pneumothorax.  Interstitial thickening which is increased.  Suggestion of concurrent interlobular septal thickening, most apparent at the right lung base. No lobar consolidation.  IMPRESSION: Findings suspicious for pulmonary venous congestion, superimposed upon chronic bronchitis/COPD.  Trace bilateral pleural effusions.   Original Report Authenticated By: Jeronimo Greaves, M.D.   Dg Chest 2 View  02/01/2013   *RADIOLOGY REPORT*  Clinical Data: Shortness of breath, cough, COPD, asthma  CHEST - 2 VIEW  Comparison: 10/15/2012  Findings: Chronic interstitial markings.  Mild left upper lobe scarring.  No focal consolidation. No pleural effusion  or  pneumothorax.  The heart is top normal in size.  Mild degenerative changes of the visualized thoracolumbar spine.  IMPRESSION: No evidence of acute cardiopulmonary disease.   Original Report Authenticated By: Charline Bills, M.D.   Dg Abd 1 View  02/02/2013   *RADIOLOGY REPORT*  Clinical Data: Nausea and vomiting.  ABDOMEN - 1 VIEW  Comparison: CT abdomen and pelvis 06/02/2007  Findings: Gas and stool in the colon.  No small or large bowel distension.  No radiopaque stones.  Visualized bones appear intact.  IMPRESSION: Nonobstructive bowel gas pattern.   Original Report Authenticated By: Burman Nieves, M.D.   US Abdomen Complete  02/02/2013   *RADIOLOGY REPORT*  Clinical Data:  Nausea, vomiting.  Morbid obesity.  History of cirrhosis, diabetes mellitus type 2.  COMPLETE ABDOMINAL ULTRASOUND  Comparison:  Ultrasound the abdomen 09/07/2012  Findings:  Gallbladder:  Gallbladder is distended.  Gallbladder wall is 3.3 mm maximum thickness.  No stones are identified.  No sonographic Murphy's sign.  Common bile duct:  6.2 mm  Liver:  The liver is scalloped, dense, and heterogeneous, consistent with the history of cirrhosis. No focal mass.  IVC:  Appears normal.  Pancreas:  No focal abnormality seen.  Spleen:  Spleen is enlarged, 14.0 cm in cranial caudal length, 939 ml in volume.  A simple cyst is 1.6 x 1.5 x 1.5 cm in the upper region.  Right Kidney:  11.4 cm in length, normal appearance.  Left Kidney:  11.1 cm in length, normal appearance.  Abdominal aorta:  Distal abdominal aorta is obscured by bowel gas. No aortic aneurysm identified.  Maximum diameter, 2.5 cm.  IMPRESSION:  1.  Mildly thickened wall of the gallbladder, a nonspecific finding often associated with cirrhosis.  No other evidence for acute cholecystitis. 2.  Splenomegaly and cirrhosis. 3.  Normal appearance of the kidneys.   Original Report Authenticated By: Norva Pavlov, M.D.     Microbiology: No results found for this or any previous  visit (from the past 240 hour(s)).   Labs: Basic Metabolic Panel:  Recent Labs Lab 02/16/13 1629 02/17/13 0440 02/18/13 0532 02/19/13 0530  NA 136 138 137 136  K 3.7 3.5 3.5 3.5  CL 99 101 101 104  CO2 29 27 26 27   GLUCOSE 311* 241* 279* 169*  BUN 15 16 19 20   CREATININE 0.87 0.99 0.97 1.04  CALCIUM 8.9 8.6 8.3* 8.4  MG  --   --  2.0  --    Liver Function Tests:  Recent Labs Lab 02/16/13 1629  AST 60*  ALT 41*  ALKPHOS 151*  BILITOT 0.9  PROT 6.4  ALBUMIN 3.1*   No results found for this basename: LIPASE, AMYLASE,  in the last 168 hours No results found for this basename: AMMONIA,  in the last 168 hours CBC:  Recent Labs Lab 02/16/13 1629  WBC 4.4  NEUTROABS 3.4  HGB 9.2*  HCT 28.8*  MCV 92.9  PLT 84*   Cardiac Enzymes:  Recent Labs Lab 02/16/13 2253 02/17/13 0440 02/17/13 1026  TROPONINI <0.30 <0.30 <0.30   BNP: No components found with this basename: POCBNP,  CBG:  Recent Labs Lab 02/18/13 1612 02/18/13 2053 02/19/13 0614 02/19/13 1110 02/19/13 1613  GLUCAP 179* 310* 142* 220* 219*    Time coordinating discharge:  Greater than 30 minutes  Signed:  Gricel Copen, DO [facility] Pager: [phone] 02/19/2013, 5:11 PM

## 2013-02-19 NOTE — Evaluation (Signed)
Physical Therapy Evaluation Patient Details Name: [name] MRN: [id number] DOB: 1941/01/10 Today's Date: 02/19/2013 Time: 1150-1207 PT Time Calculation (min): 17 min  PT Assessment / Plan / Recommendation History of Present Illness  72 y.o. female with h/o severe COPD 3L home O2 at baseline, who presents to the ED with c/o worsening SOB especially with activity at home.  This is associated with dry non-productive cough.  She does note that on Sunday she also had worsening BLE edema.  Clinical Impression  Pt will benefit from acute PT services to improve overall mobility and prepare for safe d/c home with family.     PT Assessment  Patient needs continued PT services    Follow Up Recommendations  Home health PT;Supervision - Intermittent    Does the patient have the potential to tolerate intense rehabilitation      Equipment Recommendations  None recommended by PT    Frequency Min 3X/week    Precautions / Restrictions Precautions Precautions: Fall Restrictions Weight Bearing Restrictions: No   Pertinent Vitals/Pain C/o left hip pain with mobility; does not rate      Mobility  Bed Mobility Bed Mobility: Supine to Sit;Sitting - Scoot to Edge of Bed;Sit to Supine Supine to Sit: 5: Supervision Sitting - Scoot to Edge of Bed: 5: Supervision Details for Bed Mobility Assistance: Supervision for safety Transfers Transfers: Sit to Stand;Stand to Sit Sit to Stand: 4: Min assist;From bed Stand to Sit: 4: Min assist;To chair/3-in-1 Details for Transfer Assistance: (A) to initiate transfer with cues for hand placement Ambulation/Gait Ambulation/Gait Assistance: 4: Min assist Ambulation Distance (Feet): 20 Feet Assistive device: 1 person hand held assist Ambulation/Gait Assistance Details: HHA to maintain balance.  Pt needed to use restroom prior to PT to provide walker.  Highly recommend RW next visit Gait Pattern: Step-through pattern;Decreased stride length;Trunk  flexed Gait velocity: decr Stairs: No Wheelchair Mobility Wheelchair Mobility: No    Exercises     PT Diagnosis: Difficulty walking;Generalized weakness  PT Problem List: Decreased strength;Decreased activity tolerance;Decreased balance;Decreased mobility;Decreased knowledge of use of DME PT Treatment Interventions: DME instruction;Gait training;Stair training;Functional mobility training;Therapeutic activities;Therapeutic exercise;Balance training;Patient/family education     PT Goals(Current goals can be found in the care plan section) Acute Rehab PT Goals Patient Stated Goal: Return home PT Goal Formulation: With patient Time For Goal Achievement: 02/26/13 Potential to Achieve Goals: Good  Visit Information  Last PT Received On: 02/19/13 Assistance Needed: +1 History of Present Illness: 72 y.o. female with h/o severe COPD 3L home O2 at baseline, who presents to the ED with c/o worsening SOB especially with activity at home.  This is associated with dry non-productive cough.  She does note that on Sunday she also had worsening BLE edema.       Prior Functioning  Home Living Family/patient expects to be discharged to:: Private residence Living Arrangements: Spouse/significant other Available Help at Discharge: Family Type of Home: House Home Access: Stairs to enter Secretary/administrator of Steps: 3 Entrance Stairs-Rails: Right Home Layout: One level Home Equipment: Environmental consultant - 4 wheels Additional Comments: Home O2 (3L)  Lives With: Spouse Prior Function Level of Independence: Needs assistance Communication Communication: No difficulties Dominant Hand: Right    Cognition  Cognition Arousal/Alertness: Awake/alert Behavior During Therapy: WFL for tasks assessed/performed    Extremity/Trunk Assessment Lower Extremity Assessment Lower Extremity Assessment: Generalized weakness;RLE deficits/detail;LLE deficits/detail LLE: Unable to fully assess due to pain (c/o left hip  pain)   Balance    End of Session  PT - End of Session Equipment Utilized During Treatment: Gait belt;Oxygen (3L) Activity Tolerance:  (limited by SOB which is baseline limitation) Patient left: in chair;with call bell/phone within reach Nurse Communication: Mobility status  GP     [name] 02/19/2013, 1:51 PM Jake Shark, PT DPT [phone]

## 2013-02-19 NOTE — Progress Notes (Signed)
Spoke [facility] hospitalist  Kallie Edward updated with patient's HR going down to 39 non sustained.orders noted. Continued to monitor pt closely

## 2013-02-19 NOTE — Progress Notes (Signed)
Inpatient Diabetes Program Recommendations  AACE/ADA: New Consensus Statement on Inpatient Glycemic Control (2013)  Target Ranges:  Prepandial:   less than 140 mg/dL      Peak postprandial:   less than 180 mg/dL (1-2 hours)      Critically ill patients:  140 - 180 mg/dL  Results for [name], [name] (MRN [id number]) as of 02/19/2013 14:19  Ref. Range 02/19/2013 06:14 02/19/2013 11:10  Glucose-Capillary Latest Range: 70-99 mg/dL 409 (H) 811 (H)   Inpatient Diabetes Program Recommendations Insulin - Basal: . Insulin - Meal Coverage: consider adding Novolog 4 units TID w meals  Thank you  Piedad Climes BSN, RN,CDE Inpatient Diabetes Coordinator [phone] (team pager)

## 2013-02-19 NOTE — Progress Notes (Signed)
Pt had brady cardic episode HR=39 non sustained then went up to upper 40's and lower 50's  while patient is asleep. Pt asymptomatic. No c/o chest pains .text paged [facility] hospitalist awaiting for orders. Continued to monitor pt closely

## 2013-02-22 ENCOUNTER — Telehealth: Payer: Self-pay

## 2013-02-22 NOTE — Telephone Encounter (Signed)
Christy with [facility] [phone] called regarding pt's cbg, it has been running 250-350 daily pt has using Lantus hs, pt has an appointment on 7/25, please advise.

## 2013-02-23 ENCOUNTER — Telehealth: Payer: Self-pay | Admitting: Pulmonary Disease

## 2013-02-23 NOTE — Telephone Encounter (Signed)
[name], [facility], returned Mindy's call.  Per Katie:  SN gave ok for PT & OT; Mindy all ready gave VO for continued PT, but did not provide VO for OT.  Tell [name] ok to continue w/ OT.  [name] verbalized understanding & states nothing further needed at this time.  Antionette Fairy

## 2013-02-23 NOTE — Telephone Encounter (Signed)
I called and LMTCB x1 for [name]. I called david and gave VO for continue PT.

## 2013-02-23 NOTE — Telephone Encounter (Signed)
[name] returning call can be reached at [phone].[name]

## 2013-02-23 NOTE — Telephone Encounter (Signed)
[name]- PT @ [facility] saw pt today. Wants a verbal to continue 2 x a week for 3 wks to work on gait / mobility. Requests verbal order [phone]. Hazel Sams

## 2013-02-23 NOTE — Telephone Encounter (Signed)
Called and lmomtcb for tracy from [facility].    Ok for eval of PT/OT for this pt per SN.

## 2013-02-24 NOTE — Telephone Encounter (Signed)
Please increase lantus to 70 units qd

## 2013-02-24 NOTE — Telephone Encounter (Signed)
French Ana with [facility] advised

## 2013-02-25 ENCOUNTER — Other Ambulatory Visit (INDEPENDENT_AMBULATORY_CARE_PROVIDER_SITE_OTHER): Payer: Medicare Other

## 2013-02-25 ENCOUNTER — Encounter: Payer: Self-pay | Admitting: Pulmonary Disease

## 2013-02-25 ENCOUNTER — Ambulatory Visit (INDEPENDENT_AMBULATORY_CARE_PROVIDER_SITE_OTHER): Payer: Medicare Other | Admitting: Pulmonary Disease

## 2013-02-25 VITALS — BP 130/70 | HR 54 | Temp 98.4°F | Ht 64.0 in | Wt 218.0 lb

## 2013-02-25 DIAGNOSIS — F333 Major depressive disorder, recurrent, severe with psychotic symptoms: Secondary | ICD-10-CM

## 2013-02-25 DIAGNOSIS — I2510 Atherosclerotic heart disease of native coronary artery without angina pectoris: Secondary | ICD-10-CM

## 2013-02-25 DIAGNOSIS — M1990 Unspecified osteoarthritis, unspecified site: Secondary | ICD-10-CM

## 2013-02-25 DIAGNOSIS — K754 Autoimmune hepatitis: Secondary | ICD-10-CM

## 2013-02-25 DIAGNOSIS — I10 Essential (primary) hypertension: Secondary | ICD-10-CM

## 2013-02-25 DIAGNOSIS — D509 Iron deficiency anemia, unspecified: Secondary | ICD-10-CM

## 2013-02-25 DIAGNOSIS — E669 Obesity, unspecified: Secondary | ICD-10-CM

## 2013-02-25 DIAGNOSIS — D649 Anemia, unspecified: Secondary | ICD-10-CM

## 2013-02-25 DIAGNOSIS — G2581 Restless legs syndrome: Secondary | ICD-10-CM

## 2013-02-25 DIAGNOSIS — I5033 Acute on chronic diastolic (congestive) heart failure: Secondary | ICD-10-CM

## 2013-02-25 DIAGNOSIS — D500 Iron deficiency anemia secondary to blood loss (chronic): Secondary | ICD-10-CM

## 2013-02-25 DIAGNOSIS — E119 Type 2 diabetes mellitus without complications: Secondary | ICD-10-CM

## 2013-02-25 DIAGNOSIS — K5730 Diverticulosis of large intestine without perforation or abscess without bleeding: Secondary | ICD-10-CM

## 2013-02-25 DIAGNOSIS — K31819 Angiodysplasia of stomach and duodenum without bleeding: Secondary | ICD-10-CM

## 2013-02-25 DIAGNOSIS — J449 Chronic obstructive pulmonary disease, unspecified: Secondary | ICD-10-CM

## 2013-02-25 DIAGNOSIS — K219 Gastro-esophageal reflux disease without esophagitis: Secondary | ICD-10-CM

## 2013-02-25 DIAGNOSIS — I359 Nonrheumatic aortic valve disorder, unspecified: Secondary | ICD-10-CM

## 2013-02-25 DIAGNOSIS — M810 Age-related osteoporosis without current pathological fracture: Secondary | ICD-10-CM

## 2013-02-25 DIAGNOSIS — E7800 Pure hypercholesterolemia, unspecified: Secondary | ICD-10-CM

## 2013-02-25 DIAGNOSIS — J4489 Other specified chronic obstructive pulmonary disease: Secondary | ICD-10-CM

## 2013-02-25 LAB — CBC WITH DIFFERENTIAL/PLATELET
Basophils Absolute: 0 10*3/uL (ref 0.0–0.1)
Hemoglobin: 8.8 g/dL — ABNORMAL LOW (ref 12.0–15.0)
Lymphocytes Relative: 8.8 % — ABNORMAL LOW (ref 12.0–46.0)
Monocytes Relative: 9.3 % (ref 3.0–12.0)
Neutro Abs: 2.5 10*3/uL (ref 1.4–7.7)
Neutrophils Relative %: 78 % — ABNORMAL HIGH (ref 43.0–77.0)
RBC: 2.76 Mil/uL — ABNORMAL LOW (ref 3.87–5.11)
RDW: 17.8 % — ABNORMAL HIGH (ref 11.5–14.6)

## 2013-02-25 LAB — IBC PANEL
Saturation Ratios: 13.5 % — ABNORMAL LOW (ref 20.0–50.0)
Transferrin: 306.1 mg/dL (ref 212.0–360.0)

## 2013-02-25 LAB — FERRITIN: Ferritin: 12.5 ng/mL (ref 10.0–291.0)

## 2013-02-25 NOTE — Patient Instructions (Addendum)
Today we updated your med list in our EPIC system...    Continue your current medications the same...  Today we did your follow up blood count & Iron level...    We will call you w/ the results & let you know if you need more iron IV at this time...  Let's plan a follow up visit in 1 month.Marland KitchenMarland Kitchen

## 2013-02-25 NOTE — Progress Notes (Signed)
Quick Note:  LMTCB ______ 

## 2013-02-26 ENCOUNTER — Encounter: Payer: Self-pay | Admitting: Pulmonary Disease

## 2013-02-26 NOTE — Progress Notes (Signed)
Subjective:    Patient ID: [name], female    DOB: 17-Feb-1941, 72 y.o.   MRN: [id number]  HPI 72 y/o WF here for a follow up visit... she has mult med problems as noted below>   ~  October 25, 2011:  51mo ROV & Hae is w/o complaints today; SEE 06/12/11 OV for prev note string...  We rechecked labs today (see below), no changes made...    COPD> on Home O2, Advair250, NEBS, Mucinex; remote smoker quit yrs ago; known obstructive & restrictive lung dis; she has lost 10# in the interval & feels as though her breathing is sl improved as a result; asked to continue the same meds for now.     HBP> on Aten50, Lasix40Bid, K20; BP= 120/72 & she has mult somatic complaints but no specific CP, palpit, ch in SOB or edema; continue low sodium 7 wt reduction.     CAD & AS/AI> known non-obstructive CAD; hx atypCP/ CWP; no angina & she remains way too sedentary; discussed risk factor reduction strategy & she has f/u Cards DrCooper soon.    Chol> on Simva40; not really on diet & we discussed this; last FLP 10/12 showed TChol 110, TG 116, HDL 34, LDL 53; continue same, needs exercise!    DM> on Lantus20, Metform1000Bid, Glimep4, Januvia100; needs much better diet & exercise program! Not checking BS at home; BS=276, A1c=6.8 (improved from prev) & Pharm indicates that she is filling pretty regularly...    Obesity> way too sedentary, ?on diet & she has finally lost a little wt (down 10# to 215# today); we discussed diet, exercise, wt reduction strategies...    {GERD, Angiodysplasia w/ chronic GI blood loss...    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 We will continue  to check labs every 8mo & ROV in 51mo...    DJD/ FM/ ?osteopenia/ VitD defic> she has mult somatic complaints & takes Tylenol prn; on VitD 50K weekly...    RLS> on Requip4Qhs & Sinemet25/100Bid from DrDohmeier w/ fair control of symptoms...    Anxiety/ Depression/ Psyche> on RX per Psychiatry DrHejazi (we don't have notes from him) & she does not know her meds, didn't bring med bottles or med list!  As best we can tell she is on Nuvigil, Pristiq, Wellbutrin, Risperdal, Seroquel, Xanax> all from Psychiatry & she is reminded to bring all bottles to every visit... LABS 3/13:  Chems- ok x BS=276 A1c=6.8;  CBC- Hg=11.2 Fe=71 WBC=4.2K, Plat=75K...  ~  Jan 13, 2012:  49mo ROV & post hosp visit> Hosp 3/29 - 11/26/11 w/ COPD exac & mult chronic medical issues; treated w/ Pred & Zithromax & improved;  disch on Oxygen 2L/min, Advair250Bid, Pred taper, Proair prn;  Now improved & approaching baseline, she has completed the Pred taper but noted incr SOB, cough, & yellow sputum==> she wants additional antibiotic rx & we wrote for Avelox x7d...  She has mult additional somatic complaints> dizzy, abd discomfort, RLS, etc; overall they note fewer good days and more bad days; he daugh has been in the hosp & it wears her out to go  for a visit...     Hx DM on Lantus plus Metformin500mg - 2Bid, Glimepiride 4mg  Qam, & Januvia 100mg /d> BS=191, A1c=6.9 and she will maintain her current regimen + diet & try to incr exercise...    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    We reviewed her prob list, meds, xrays and labs> see below>>  LABS 5/13:  FLP- at goals on Simva40 x HDL=32;  Chems- ok x BS=191 A1c=6.9 LFTs=sl elev;  CBC- pancytopenic w/ Hg=9.8 Fe=53;  TSH=3.50  ~  February 24, 2012:  6wk ROV & [name] has experienced slowly incr blood sugars requiring incr Lantus  doses- now at 40u daily & BS checks still in the 300s; in addition she is c/o worsening RLS- prev evaluated by DrDohmeier, Neurology & she was on Requip & Sinemet w/ better control but she stopped the Requip on her own some time ago w/ recurrent RLS symptoms...    Breathing is ok; BP controlled on Aten & Lasix; Chol prev ok on Simva40;  She remains on 6 psychotopic meds from Psychiatry, [name] but she never brings bottles & we don't have notes..    We reviewed prob list, meds, xrays and labs> see below>> LABS 7/13:  Chems- ok x BS=339;  A1c=7.4 REC to slowly incr Lantus by 5u increments until FBS <150...  ~  May 26, 2012:  56mo ROV & [name] relates the following issues since last visit>>    She saw DrDohmeier for Neuro for f/u Tremors & she made several adjustments in her Psyche meds- stopped the Risperdal & decr the Seroquel to 100mg /d; pt notes that tremors are better but still signif (also on SinemetCR-Bid) & she claims "withdrawal" from the Risperdal cessation w/ fallx2/ "messed up my liver"/ "sugars went crazy"...  Initiation of Requip really has helped the RLS...    ?Labs by Neuro showed low iron by pt report (we don't have notes from DrDohmeier) & DrDBrodie ordered Iron infusion for pt- done 04/22/12 w/ 1000mg  Iron Dextran infused at day surg; she is due for f/u labs today> Hg=11.1  Fe=76    She is c/o choking while eating/ swallowing & she has f/u appt w/ DrDBrodie to see about repeat EGD w/ dilatation (last EGD 2011 showed normal esoph, GAVE syndrome w/ argon laser ablation)...    DM control "went crazy" she says> she has cut the Lantus to 25u/d, & still taking the Metform500-2Bid, Glim4/d, Januv100/d; BS at home varies betw 68-242 & labs today showed BS=164  A1c= 5.0     We reviewed prob list, meds, xrays and labs> see below for updates >> OK Flu vaccine today... LABS 10/13:  Chems- ok x BS=164 A1c=5.0;  CBC- ok x Hg=11.1 WBC=4.2 Plat=86 Fe=76 (23%)  ~  September 01, 2012:  56mo ROV & [name]  is c/o a URI "cold in my chest" w/ cough, discolored phlegm "diff colors at diff times", chest congestion, etc; she notes- tired all the time, HH is bothering her, BS at home betw 170-200 range... We reviewed the following medical problems during today's office visit>>     COPD> on Home O2, Advair250, NEBS, Mucinex; remote smoker quit yrs ago; known obstructive & restrictive lung dis; she had ZPak recently but persists w/ c/o discolored phlegm, no f/c/s, vague chest discomfort; last CXR 3/13 showed linear scarring left mid lung, otherw clear;  CTA 3/13 was neg for PE & showed some atelec, coronary calcif, multilevel spondylosis, & cirrhosis of the liver; she declined f/u film today- we decided to treat w/ Levaquin, Mucinex, Fluids, Hycodan...    HBP> on Aten50, Lasix40, K20; BP= 118/82 & she has mult somatic complaints but no specific CP, palpit, ch in SOB or edema; continue low sodium & wt reduction.     CAD & mild AS/AI> known non-obstructive CAD; hx atypCP/ CWP; no angina & she remains way too sedentary; discussed risk factor reduction strategy & she saw DrCooper 7/13> stable, no angina, too sedentary; f/u 2DEcho w/ mild LVH, norm LVF w/ EF=55-60% & norm wall motion, mils AS & AI, Gr2DD...    Chol> on Simva40; not really on diet & we discussed this; last FLP 5/13 showed TChol 94, TG 130, HDL 32, LDL 36; continue same, needs exercise!    DM> on Lantus25, Metform1000Bid, Glimep2, [id number]; needs much better diet & exercise program! Not checking BS at home; labs from 10/13 showed BS=164, A1c=5.0 so we decreased the Glimep from 4mg  to 2mg  each AM...    Obesity> way too sedentary, ?on diet & her wt is up to 209# today; we discussed diet, exercise, wt reduction strategies...    << Chronic GI problems followed by DrDBrodie >>    {GERD, Angiodysplasia w/ chronic GI blood loss >> see below... 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 We will continue to check labs every 101mo w/ Iron infusions of Hg is reduced & ROV in 38mo...    DJD/ FM/ ?osteopenia/ VitD defic> she has mult somatic complaints & takes Tylenol prn; on VitD 50K weekly...    RLS> on Requip4Qhs & Sinemet25/100Bid from DrDohmeier w/ fair control of symptoms- her note is reviewed...    Anxiety/ Depression/ Psyche> on RX per Psychiatry DrHejazi (we don't have notes from him) & she does not know her meds, didn't bring med bottles or med list!  As best we can tell she is on Nuvigil, Pristiq, Wellbutrin, Risperdal, Seroquel, Xanax> all from Psychiatry & she is reminded to bring all bottles to every visit... We reviewed prob list, meds, xrays and labs> see below for updates >>  LABS 1/14:  CBC- Hg=7.0 (down 4pts) Hct=21.9 Fe=51 (11%sat) B12=623, Protime=11.6/ 1.1, AFP=pending; Abd sonar per DrDBrodie- pending...  ~  Dec 30, 2012:  38mo ROV & [name] is c/o incr SOB, having to wear her O2 all the time, and can't get a deep breath; she feels weak, states she's having a lot of nausea (wants phenergan refilled); she has had several interval visits w/ [facility] & DrDBrodie for f/u of her Cirrhosis (no ascites on sonar 1/14), watermelon stomach, GAVE syndrome, & chr blood loss anemia; on Protonix40 & had EGD w/ Argon Laser ablation of gastric AVM 1/14, followed by IV Iron therapy for Hg=7; her Hg improved to 9.3 then fell back to 8.8 & remained there (Fe improved from 51 to 177 after the infusion & has settled back to 70 since then)...  DrDBrodie asked that we recheck her from the Pulm standpoint due to her SOB>>     COPD> on Home O2, Advair250, NEBS, Mucinex; remote smoker quit yrs ago; known obstructive & restrictive  lung dis; last CXR 2/14 showed cardiomeg, no pulm edema, clear lungs, NAD; CTA 3/13 was neg for PE & showed some atelec, coronary  calcif, multilevel spondylosis, & cirrhosis of the liver; she saw TP 3/14 w/ incr cough, thick yellow sput, congestion & post nasal drip> took Avelox & Mucinex w/ some improvement;  We decided to evaluate further>>  PFT today showed FVC=2.41 (83%), FEV1=1.77 (80%), %1sec=74, mid-flows= 61% predicted (all c/w min obstructive dis, +superimposed restriction)...  Ambulatory O2 sats showed 93% sat on RA at rest w/ HR=60; 87% sat after one lap on RA w/ HR=62... Other factors include 12# weight gain, weight =221#, very sedentary lifestyle, anemia (she did not want me to recheck this as it is being followed by DrDBrodie), and severe anxiety...  She thinks a lot of her SOB is from CHF siting her wt gain etc but a recent BNP 3/14 was ok at 180, she has a follow up appt w/ DrCooper soon...  I told her that in my opinion the biggest current issue (and most improvable factor) is anxiety & "chest wall muscle spasm" making her tight & hard to get a good breath "IN" as she described it; I went through my long explanation for this problems & told her that the BEST therapy for this was a "combination relaxer" like KLONOPIN & rec that she try 1mg - 1/2 to 1 tab bid... All questions answered & she is agreeable to this plan... ADDENDUM >> pt called back & indicated that after reading the Pharm handout that she decided NOT to take the Klonopin, doesn't want a substitute...  ~  February 25, 2013:  37mo ROV & post hosp check> since I last saw [name] 37mo ago- she has been Adm 3 times by [facility]>>   Adm 5/25-27 for weakness assoc w/ IDA & GIB> Hx cirrhosis, angiodysplasia, and had EGD showing sm varicies, portal gastropathy, mult AVMs w/ laser ablation; Hg was 7.7=> 2uPCs & improved to 9 range; subseq capsule endoscopy showed AVMs in upper sm intestines as well... Adm 6/9-12 for N&V assoc w/ constip & UTI (sens EColi- treated w/ Cipro) and underlying cirrhosis, portal gastropathy, angiodysplasia, etc (treated w/ PPI & monitoring of Hg & Fe-  given 2uPRBCs & Fe infusions as outpt)...  Adm 6/24-27 for DiastolicCHF (CXR- vasc congestion; 2DEcho- EF=55-60%, Gr2DD, mildAS/AI; Lasix added)...    COPD> on Home O2, Advair250, NEBS, Mucinex; remote smoker quit yrs ago; known obstructive & restrictive lung dis; CXR 6/14 showed mild cardiomeg, mild vasc congestion, linear atx at left base, otherw clear; CTA 3/13 was neg for PE & showed some atelec, coronary calcif, multilevel spondylosis, & cirrhosis of the liver; she continues on O2 by [place] at 3L/min...     HBP> on Aten50, Lisinopril5, Lasix20-3/d, K10Qod; BP= 130/70 & she has mult somatic complaints but no specific CP, palpit, ch in SOB or edema; continue low sodium & wt reduction.     CAD, DiastolicCHF, & mild AS/AI> known non-obstructive CAD; hx atypCP/ CWP; no angina & she remains way too sedentary; discussed risk factor reduction strategy & she saw DrCooper 5/14> stable, no angina, too sedentary; last 2DEcho 7/13 w/ mild LVH, norm LVF w/ EF=55-60% & norm wall motion, mild AS&AI, Gr2DD;  EKG 5/14 w/ NSR, rate62, 1st degree AVB, NSSTTWA...    Chol> on Simva40; not really on diet & we discussed this; last FLP 5/13 showed TChol 94, TG 130, HDL 32, LDL 36; continue same, needs exercise!    DM> on Lantus20u &  off all DM pills per DrEllison; needs much better diet & exercise program! Not checking BS at home; labs from 6/14 showed BS=169, A1c=6.7 & she will continue f/u w/ Endocrinology...    Obesity> way too sedentary, ?on diet & her wt is 218# today; we discussed diet, exercise, wt reduction strategies...    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    DJD/ FM/ ?osteopenia/ VitD defic> she has mult somatic complaints & takes Tylenol prn; on MVI + VitD 50K weekly...    RLS> on Requip4Qhs & Sinemet25/100Bid from DrDohmeier w/ fair control of symptoms- her note is reviewed...    Anxiety/ Depression/ PsycheHx> on RX per Psychiatry DrHejazi (we don't have notes from him) & she does not know her meds, didn't bring med bottles or med list!  As best we can tell she is on Nuvigil (on hold), Pristiq100, WellbutrinXL150, Seroquel100-4Qhs, Xanax1mg  prn- all from Psychiatry & she is reminded to bring all bottles to every visit... We reviewed prob list, meds, xrays and labs> see below for updates >>  LABS 7/14:  CBC- Hg=8.8, MCV=93, WBC=3.2, Plat=74K;  Fe=58 (14%sat), Ferritin=12.5.Marland KitchenMarland Kitchen NOTE>> Fe is dropping & we decided to infuse [facility] 510mg  x2 as outpt... ROV 39mo.           Problem List:  COPD (ICD-496) & BRONCHITIS, RECURRENT (ICD-491.9) - Ex-smoker w/ underlying COPD on home O2, ADVAIR 250Bid, ALBUTEROL (via nebs or MDI for Prn use), & MUCINEX 1-2Bid... she has combined obstructive AND restrictive disease (based on her obesity) w/ diet (weight reduction) + exercise strongly recommended to the pt...  ~  CTChest 5/06 = neg. ~  baseline CXR w/ bilat LL scarring, NAD.... last CXR 3/09 = unchanged, NAD. ~  PFTs 3/09 showed FVC= 2.47 (83%), FEV1= 1.65 (69%), %1sec= 67, mid-flows= 28%pred. ~  CXR 4/12 showed bilat scarring, NAD... ~  PFT 10/12 showed FVC= 2.28 (77%), FEV1= 1.46 (64%), %1sec= 64, mid-flows= 31% pred. ~  CXR 3/13 showed normal heart size, prom hilar regions, no infiltrates etc, NAD.[name]. ~  CTAngio Chest 3/13 showed neg for PE- scarring at lung bases, no adenopathy or lung lesions, calcified coronaries, multilevel spondylosis in TSpine, cirrhosis in liver ~  CXR 2/14 showed cardiomeg, no pulm edema, clear lungs, NAD ~  PFT 5/14 showed FVC=2.41 (83%), FEV1=1.77 (80%), %1sec=74, mid-flows= 61% predicted (all c/w min obstructive  dis, +superimposed restriction) ~  Ambulatory O2 sats 5/14 showed 93% sat on RA at rest w/ HR=60; 87% sat after one lap on RA w/ HR=62 ~  7/14: on Home O2, Advair250, NEBS, Mucinex; remote smoker quit yrs ago; known obstructive & restrictive lung dis; CXR 6/14 showed mild cardiomeg, mild vasc congestion, linear atx at left base, otherw clear; CTA 3/13 was neg for PE & showed some atelec, coronary calcif, multilevel spondylosis, & cirrhosis of the liver; she continues on O2 by [place] at 3L/min...   HYPERTENSION (ICD-401.9) >>  ~  on ATENOLOL 50mg /d,  LASIX 40mg /d & K20/d...  ~  10/11:  BP=116/78 and she has mult somatic complaints but all are  diminished compared to prev visits; denies bad HA's, CP, palpit, syncope, edema, etc... ~  4/12:  BP= 140/60 and she persists w/ mult somatic complaints... ~  10/12:  BP= 122/74 & she has mult somatic complaints; needs better low sodium, calorie restricted diet & wt reduction... ~  3/13:  BP= 120/72 & she is unchanged symptomatically;  EKG 3/13 showed NSR, rate68, LAD, late transition, NSSTTWA... ~  7/13:  BP= 110/62 & she persists w/ mult somatic complaints... ~  10/13:  BP= 118/60 & she remains clinically stable... ~  7/14: on Aten50, Lisinopril5, Lasix20-3/d, K10Qod; BP= 130/70 & she has mult somatic complaints but no specific CP, palpit, ch in SOB or edema; continue low sodium & wt reduction.  CAD (ICD-414.00) - Hosp at [facility] 4/09 w/ CP... cath showed non-obstructive CAD... no CP, palpit, etc (but she is way too sedentary). ~  cath 4/09 by DrCooper showed normLMAIN, 20% midLAD, norm CIRC, 20-30% proxRCA, EF= 50-55%... ~  EKG 1/11 showed SBrady, rate54, ?septal infarct, NSSTTWA, NAD... ~  EKG 3/13 showed NSR, rate68, LAD, late transition, NSSTTWA... ~  CTAngio Chest 3/13 showed calcif coronaries as noted... ~  DrCooper 7/13> stable, no angina, too sedentary; f/u 2DEcho w/ mild LVH, norm LVF w/ EF=55-60% & norm wall motion, mils AS & AI,  Gr2DD.Marland KitchenMarland Kitchen  PALPITATIONS (ICD-785.1) >>  DIASTOLIC CHF >>  ~  eval by DrKlein w/ 7 beats WT on monitor... he changed Lisinopril to BBlocker (currently ATENOLOL 50mg /d) and improved... ~  she saw DrCopper for Cards f/u 1/11- palpit well controlled on Atenolol; hx non-obstructive CAD & good LVF; no change in Rx. ~  [facility] 6/14 w/ Diastolic CHF and Lasix adjusted- Disch on Lasix20-3/d  AORTIC STENOSIS (ICD-424.1) - she has mild Ao Valve disease w/ AS/ AI... followed by DrCooper. ~  2DEcho 11/06 showed mild ca++ AoV w/ mild AS, norm LVF w/ EF=55-65% ~  repeat 2DEcho 1/09 showed similar mild ca++ AoV w/ mild AS, mild AI, no regional wall motion abn, norm LVf w/ EF= 60%... NOTE: est PAsys= 35... ~  repeat 2DEcho 1/10 showed norm LVF w/ EF= 55-60%, no regional wall motion abn, mild DD, mod calcif AoV & mild reduced leaflet excursion & mild AI... ~  Repeat 2DEcho 7/13 showed norm LVF w/ EF=55-65%, no regional wall motion abn, Gr2DD, mildAS, mildAI, mild LAdil... ~  DrCooper 7/13> stable, no angina, too sedentary; f/u 2DEcho w/ mild LVH, norm LVF w/ EF=55-60% & norm wall motion, mils AS & AI, Gr2DD... ~  7/14: known non-obstructive CAD; hx atypCP/ CWP; no angina & she remains way too sedentary; discussed risk factor reduction strategy & she saw DrCooper 5/14> stable, no angina, too sedentary; last 2DEcho 7/13 w/ mild LVH, norm LVF w/ EF=55-60% & norm wall motion, mild AS&AI, Gr2DD;  EKG 5/14 w/ NSR, rate62, 1st degree AVB, NSSTTWA...  HYPERCHOLESTEROLEMIA (ICD-272.0) - on SIMVASTATIN 40mg /d... ~  FLP 6/09 showed TChol 107, TG 69, HDL 35, LDL 59 ~  FLP 3/10 showed TChol 128, TG 125, HDL 33, LDL 70 ~  FLP 12/10 showed TChol 98, TG 64, HDL 37, LDL 48... continue same. ~  FLP 10/11 showed TChol 124, TG 105, HDL 37, LDL 66 ~  FLP 4/12 showed TChol 122, TG 95, HDL 37, LDL 66 ~  FLP 10/12 on Simva40 showed TChol 110, TG 116, HDL 34, LDL 53 ~  FLP 5/13 on Simva40 showed TChol 94, TG 130, HDL 32, LDL 36  DM  (ICD-250.00) - hx irreg control,  not really on diet... on LANTUS 25u daily,  METFORMIN 500mg -2Bid,  GLIMEPIRIDE 4mg /d,  JANUVIA 100mg /d...  ~  labs 10/08 BS=198, HgA1C=7.2 ~  labs 1/09 showed BS=119, HgA1c=6.0 ~  labs 6/09 showed BS= 248, HgA1c= 7.0..[name] discussed need for starting insulin if she can't get on track and get wt down... ~  labs 9/09 showed BS= 236, HgA1c= 7.6.Marland KitchenMarland Kitchen rec- start Lantus 5u/d... grad increased to 10u. ~  labs 11/09 showed BS= 166, HgA1c= 6.0.Marland KitchenMarland Kitchen rec- great! keep same. ~  labs 3/10 showed BS= 169, A1c= 6.8 ~  labs 12/10 showed BS= 149, A1c= 5.8, Umicroalb= neg. ~  labs 10/11 showed BS= 215, A1c= 7.1.[name]. rec> better diet, get wt down. ~  Labs 4/12 showed BS= 172, A1c=5.8 ~  Labs 10/12 showed BS= 245, A1c= 8.1.Marland KitchenMarland Kitchen rec to take meds every day; incr Lantus dose 10==> 20u/d... ~  Labs 3/13 on Lantus20+22meds showed BS= 276, A1c= 6.8 (improved)... ~  4/13:  No diabetic retinopathy per optometrist at [facility]... ~  Labs 5/13 on Lantus20+52meds showed BS= 191, A1c= 6.9 ==> subsequently BS incr & Lantus incr ~  Labs 7/13 on Lantus40+38meds showed BS= 339, A1c= 7.4.Marland KitchenMarland Kitchen ?why control is off, rec move shots around & incr by 5u/wk til BS<200. ~  Labs 10/13 on Lantus25+57meds showed BS= 164, A1c= 5.0.Marland KitchenMarland Kitchen Too tight & rec to decr Lantus20 & decr Glimep4mg  to 1/2 tab... ~  She has been adjusting her meds on her own in the interval => referred to DrEllison, Endocrinology... ~  7/14: on Lantus20u & off all DM pills per DrEllison; needs much better diet & exercise program! Not checking BS at home; labs from 6/14 showed BS=169, A1c=6.7 & she will continue f/u w/ Endocrinology.  MORBID OBESITY (ICD-278.01) - not really on diet or exercise program... ~  weight 6/09 = 223# ~  weight 11/09 = 228# ~  weight 3/10 = 225# ~  weight 6/10 = 217# ~  weight 9/10 = 213# ~  weight 12/10 = 224# ~  weight 2/11 = 216# ~  weight 10/11 = 230# ~  Weight 4/12 = 222# ~  Weight 10/12 = 225# ~  Weight 3/13 =  215#...  great job, keep it up... 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  ~  EGD  2/06 showed angiodysplasia & mult gastric pseudopolyps, watermelon stomach improved from 2003. ~  recent f/u EGD 9/09 showed chr gastritis, no watermelon stomach & improved from prev... ~  recurrent anemia 12/10 w/ f/u EGD 2/11 showing gastric antral avm's- s/p argon laser ablation, no varicies etc... 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 ~  5/13:  She is not on PPI and c/o some abd discomfort- rec starting PROTONIX 40mg /d... ~  She saw DrDBrodie 10/13 w/ c/o dysphagia + f/u of her autoimmune liver dis, portal HTN, cirrhosis, "watermelon stomach" & hx argon laser ablation of gastric AVMs (chr GI blood loss w/ periodic Fe infusions- last 8/13); EGD 10/13 showed norm  esoph mucosa, no varicies, spasm at the LES but no stricture found; dilated to 64F; mod severe portal hypertensive gastropathy in stomach, no active bleeding; REC> continue PPI, try Levsin... ~  1/14:  She had EGD & Argon Laser ablation of gastric AVM by DrDBrodie... 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  DIVERTICULOSIS OF COLON (ICD-562.10) & POLYP, COLON (ICD-211.3) -  ~  last colonoscopy 10/08 w/ adenomatous polyp removed, and angiodysplasia without hemorrhage ~  CT Abd 10/08 showed Cirrhosis,  borderline splenomeg, sl pancreatic atrophy & duct dil, sm umbil hernia, DJD sp, NAD... ~  10/13: she had f/u drDBrodie> last colonoscopy was 2008, she felt pt was too high risk for prep & sedation for a f/u colonoscopy & they decided to hold off...  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 ~  10/11:  presents w/ dysuria & UTI Rx w/ Cipro... ~  12/11:  She had f/u DrGrapey... ~  6/14: she was treated for a sens EColi UTI during 6/14 Hosp w/ Cipro...  OSTEOARTHRITIS (ICD-715.90)  FIBROMYALGIA (ICD-729.1) - she persists w/ mult somatic complaints...  ? of OSTEOPOROSIS (ICD-733.00) - she had normal BMD in 2001 at [place]..  RESTLESS LEGS SYNDROME (ICD-333.94) - she prev weaned off the Requip & remains on SINEMET-CR 25-100Bid... ~  9/10: now c/o recurrent restless leg symptoms... restart REQUIP 2mg  & titrate up. ~  12/10: persistant symptoms on 2mg Qhs... rec to incr to 4mg ... ~  2/11: she reports resolution of RLS on 4mg  Requip Qhs, resting well now. ~  12/11:  Recurrent RLS symptoms despite the Requip & seen by DrDohmeier w/ addition of Sinemet 25/100 Bid... ~  2012:  Stable on Requip4mg  & Sinemet 25/100 Bid... ~  7/13:  She is once again c/o incr RLS symptoms & rec to restart REQUIP 4mg /d...  ~  8/13: she had f/u visit w/ DrDohmeier> RLS, tremors, & insomnia; Seroquel helped for awhile, she is working w/ Community education officer to help her problems... ~  7/14: on Requip4Qhs & Sinemet25/100Bid from DrDohmeier w/ fair control of symptoms- her  note is reviewed.  DYSTHYMIA (ICD-300.4) - Hx depression w/ psychotic features and hosp 2/09 at [facility] w/ delusions of pedunculosis> she was prev treated by Dr. Bartholomew Crews & DrSuttenfield for counselling...  Adm to [facility] in May09 by DrBarbSmith w/ depression and meds adjusted (see DC summary)...  Now followed by [name] and [name] at [facility]- she was re-admitted 10/09 and meds changed to RISPERDAL, PRISTIQUE, BUPROPION, SEROQUEL, XANAX & Nuvigil (Armodafinil)...  husb indicates that she is much better at present & continues outpt Rx... ~  MRI/ [facility] Brain 2/08 showed sm vessel dis & atherosclerotic changes in post circ... ~  9/10: pt indicates that she is off the Trazodone... DrHejazi added Wellbutrin ?for her RLS? ~  2011:  pt continues regular  f/u w/ Psychiatry on numerous meds w/ freq med adjustments; she is requested to get notes from [facility] to our attention & bring all med bottles to her OVs for review... ~  2012:  We have not received any notes from Psyche; pt has not complied w/ our request for med bottles to be brought to each OV... ~  2013:  We still don't have accurate list of her current meds as we have not received [facility] notes & she NEVER brings meds to office despite our requests... ~  7/14: on RX per Psychiatry DrHejazi (we don't have notes from him) & she does not know her meds, didn't bring med bottles or med list!  As best we can tell she is on Nuvigil (on hold), Pristiq100, WellbutrinXL150, Seroquel100-4Qhs, Xanax1mg  prn- all from Psychiatry & she is reminded to bring all bottles to every visit...   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 ~  labs 04/18/08 by DrBrodie showed Hg= 10.4, Fe= 52... she ordered 1,200mg  IV  Iron- given 04/26/08. ~  labs 05/19/08 showed Hg= 11.3, Fe= 117... ~  labs 11/09 showed Hg= 12.0.Marland KitchenMarland Kitchen ~  labs 3/10 showed Hg= 12.3, Fe= 79 ~  labs 6/10 showed Hg= 11.5, Fe= 73 ~  labs 12/10 showed Hg= 7.2, Fe= 28 (6%sat)... given [facility] & IV Fe per DrBrodie in Jan2012. ~  labs 2/11 showed Hg= 11.8, Fe= 95... pt indicates that DrBrodie will be following labs going forward... ~  labs 6/11 showed Hg= 11.2 ~  labs 9/11 showed Hg= 11.1, Fe= 106 ~  Labs 4/12 showed Hg=11.0, Fe=118 ~  Labs 10/12 showed Hg=9.5, Fe=35 (7%sat)... We will arrange for outpt infusion 1000mg  Fe... ~  Labs 12/12 showed Hg=11.4, Fe=92 (26%sat) ~  Labs 3/13 showed Hg=11.2, Fe=71 (17%sat) ~  Labs 5/13 showed Hg=9.8, Fe=53 (13%sat) ~  04/22/12:  She was given 1000mg  IV Iron Dextran by DrDBrodie... ~  Labs 10/13 showed Hg=11.1, Fe=76 (23%) ~  Labs 1/14 showed Hg=7.0 (down 4pts) Hct=21.9 Fe=51 (11%sat) B12=623, Protime=11.6/ 1.1, AFP=pending; Abd sonar per DrDBrodie- cirrhosis, no ascites. ~  Given IV Iron by DrDBrodie w/ Hg improved to 9.3 & Fe up to 177... ~  Subsequently her Hg settled back to 8.8 & Iron dropped to 70... The pt did not want me to recheck her labs- "DrBrodie does it" ~  [facility] 6/14 w/ further GIB and had EGD, Capsule Endo, etc- see above...  We are back to checking CBC, Fe monthly to check for needed iron infusions... ~  Labs 7/14 showed Hg= 8.8 and Fe= 58 (14%) so we decided to proceed w/ IV Fe Feraheme 510mg  x2 infusions...   Past Surgical History  Procedure Laterality Date  . Hemorroidectomy    . Appendectomy    . Vaginal hysterectomy    . Breast biopsy      bilateral  . Cesarean section      x 3  . Appendectomy    . Esophagogastroduodenoscopy  06/12/2012    Procedure: ESOPHAGOGASTRODUODENOSCOPY (EGD);  Surgeon: Hart Carwin, MD;  Location: [facility];  Service: Endoscopy;  Laterality: N/A;  . Balloon dilation  06/12/2012    Procedure: BALLOON DILATION;  Surgeon: Hart Carwin, MD;  Location: [facility]  ENDOSCOPY;  Service: Endoscopy;  Laterality: N/A;  ?balloon  . Esophagogastroduodenoscopy  09/10/2012  Procedure: ESOPHAGOGASTRODUODENOSCOPY (EGD);  Surgeon: Hart Carwin, MD;  Location: [facility]OSCOPY;  Service: Endoscopy;  Laterality: N/A;  . Hot hemostasis  09/10/2012    Procedure: HOT HEMOSTASIS (ARGON PLASMA COAGULATION/BICAP);  Surgeon: Hart Carwin, MD;  Location: [facility]OSCOPY;  Service: Endoscopy;  Laterality: N/A;  . Esophagogastroduodenoscopy N/A 01/18/2013    Procedure: ESOPHAGOGASTRODUODENOSCOPY (EGD);  Surgeon: Hart Carwin, MD;  Location: [facility] ENDOSCOPY;  Service: Endoscopy;  Laterality: N/A;    Outpatient Encounter Prescriptions as of 02/25/2013  Medication Sig Dispense Refill  . albuterol (PROAIR HFA) 108 (90 BASE) MCG/ACT inhaler Inhale 2 puffs into the lungs every 6 (six) hours as needed for shortness of breath.       . ALPRAZolam (XANAX) 1 MG tablet Take 1 mg by mouth 2 (two) times daily as needed for anxiety.       [name] atenolol (TENORMIN) 50 MG tablet Take 50 mg by mouth daily.      [name] b complex vitamins tablet Take 1 tablet by mouth daily.       . B-D ULTRAFINE III SHORT PEN 31G X 8 MM MISC USE AS DIRECTED  100 each  3  . buPROPion (WELLBUTRIN XL) 150 MG 24 hr tablet Take 150 mg by mouth daily.      . carbidopa-levodopa (SINEMET CR) 25-100 MG per tablet Take 1 tablet by mouth 2 (two) times daily. Take with lunch and dinner      . desvenlafaxine (PRISTIQ) 100 MG 24 hr tablet Take 100 mg by mouth daily.      [name] dextromethorphan-guaiFENesin (MUCINEX DM) 30-600 MG per 12 hr tablet Take 1-2 tablets by mouth every 12 (twelve) hours.       . Fluticasone-Salmeterol (ADVAIR DISKUS) 250-50 MCG/DOSE AEPB Inhale 1 puff into the lungs 2 (two) times daily.  1 each  0  . furosemide (LASIX) 20 MG tablet Take 3 tablets (60 mg total) by mouth daily.  90 tablet  0  . insulin glargine (LANTUS) 100 UNIT/ML injection Inject 70 Units into the skin daily.       [name] lisinopril (PRINIVIL,ZESTRIL) 5 MG tablet  Take 1 tablet (5 mg total) by mouth daily.  30 tablet  0  . Multiple Vitamin (MULTIVITAMIN WITH MINERALS) TABS Take 1 tablet by mouth daily. Centrum Silver      . pantoprazole (PROTONIX) 40 MG tablet Take 40 mg by mouth daily.      . polyethylene glycol (MIRALAX / GLYCOLAX) packet Take 17 g by mouth daily as needed (constipation).      . potassium chloride SA (K-DUR,KLOR-CON) 10 MEQ tablet Take 1 tablet (10 mEq total) by mouth every other day.  30 tablet  1  . promethazine (PHENERGAN) 25 MG tablet Take 25 mg by mouth 2 (two) times daily as needed for nausea.      [name] QUEtiapine (SEROQUEL) 100 MG tablet Take 400 mg by mouth at bedtime.       [name] rOPINIRole (REQUIP) 4 MG tablet Take 4 mg by mouth at bedtime.      . Saline (OCEAN NASAL SPRAY NA) Place 1 spray into the nose daily as needed (congestion).      . Simethicone (GAS-X PO) Take 1 tablet by mouth daily. Takes after the biggest meal of the day      . simvastatin (ZOCOR) 40 MG tablet Take 40 mg by mouth at bedtime.      . Vitamin D, Ergocalciferol, (DRISDOL) 50000 UNITS CAPS Take 1 capsule (50,000 Units total) by  mouth every 7 (seven) days. Take on friday  5 capsule  11  . [DISCONTINUED] hydrocortisone cream (NEOSPORIN ECZEMA ESSENT MAX ST) 1 % Apply 1 application topically at bedtime. For rash on back and shoulders       No facility-administered encounter medications on file as of 02/25/2013.    Allergies  Allergen Reactions  . Aspirin Other (See Comments)    Causes nosebleeds  . Penicillins Itching  . Theophylline Nausea And Vomiting    Current Medications, Allergies, Past Medical History, Past Surgical History, Family History, and Social History were reviewed in [employer] record.    Review of Systems         See HPI - all other systems neg except as noted...      The patient complains of weight gain, dyspnea on exertion, muscle weakness, difficulty walking, and depression.  The patient denies anorexia, fever,  weight loss, vision loss, decreased hearing, hoarseness, chest pain, syncope, peripheral edema, prolonged cough, headaches, hemoptysis, abdominal pain, melena, hematochezia, severe indigestion/heartburn, hematuria, incontinence, suspicious skin lesions, transient blindness, unusual weight change, abnormal bleeding, enlarged lymph nodes, and angioedema.     Objective:   Physical Exam     WD, Obese, 72 y/o WF in NAD... she is chr ill appearing... GENERAL:  Alert & oriented x 3... HEENT:  [place]/AT, pale conjuct, EOM-wnl, PERRLA, EACs-clear, TMs-wnl, NOSE-clear, THROAT-clear w/ dry MMs... NECK:  Supple w/ fairROM; no JVD; normal carotid impulses w/o bruits; no thyromegaly or nodules palpated; no lymphadenopathy. CHEST:  Clear to P & A; without wheezes/ rales/ or rhonchi heard... HEART:  Regular Rhythm;  gr 1/6 SEM without rubs or gallops... ABDOMEN:  Obese, soft & nontender w/ panniculus; normal bowel sounds; no organomegaly or masses detected. EXT: without deformities, mod arthritic changes; no varicose veins/ +venous insuffic/ tr edema. NEURO:  CN's intact;  no focal neuro deficits... DERM:  No lesions noted; no rash, pale complexion.  RADIOLOGY DATA:  Reviewed in the EPIC EMR & discussed w/ the patient...  LABORATORY DATA:  Reviewed in the EPIC EMR & discussed w/ the patient...   Assessment & Plan:    DYSPNEA> clearly this is multifactorial w/ components from Lung dis, CHF, Obesity, inactivity, Anemia, etc; a large component is likely related to anxiety & chest "tightness" for which she has Xanax 1mg  from her psychiatrists...  COPD>  Combined mild obstructive & mod restrictive lung dis; PFT showed sl deterioration in FEV1; multifactorial dyspnea including stress; REC> continue Advair, NEBS, O2, Mucinex, etc; get on diet/ exercise/ get weight down; continue Psyche f/u for help w/ anxiety...  HBP>  Controlled on low dose BBlocker & ACE, plus diuretic> continue same & work on weight  reduction...  CAD, DiastolicCHF>  Known nonobstructive dis, way too sedentary> we discussed diet + exercise...  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  CHOL>  Stable on the Simva40...  DM>  Managed by DrEllison on Lantus20u currently...  OBESITY>  Reviewed diet + exercise needed...  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  Autoimmune hepatitis>  Also managed by DrBrodie, off the Immuran at present... She also has pancytopenia related to her cirrhosis.  RLS>  Notes from DrDohmeier reviewed> on Sinemet + needs to restart Requip...  Dysthymia>  followed by Psyche on mult meds...  Anemia>  We will place her back on our monthly protocol of CBC &  Iron checks...   Patient's Medications  New Prescriptions   No medications on file  Previous Medications   ALBUTEROL (PROAIR HFA) 108 (90 BASE) MCG/ACT INHALER    Inhale 2 puffs into the lungs every 6 (six) hours as needed for shortness of breath.    ALPRAZOLAM (XANAX) 1 MG TABLET    Take 1 mg by mouth 2 (two) times daily as needed for anxiety.    ATENOLOL (TENORMIN) 50 MG TABLET    Take 50 mg by mouth daily.   B COMPLEX VITAMINS TABLET    Take 1 tablet by mouth daily.    B-D ULTRAFINE III SHORT PEN 31G X 8 MM MISC    USE AS DIRECTED   BUPROPION (WELLBUTRIN XL) 150 MG 24 HR TABLET    Take 150 mg by mouth daily.   CARBIDOPA-LEVODOPA (SINEMET CR) 25-100 MG PER TABLET    Take 1 tablet by mouth 2 (two) times daily. Take with lunch and dinner   DESVENLAFAXINE (PRISTIQ) 100 MG 24 HR TABLET    Take 100 mg by mouth daily.   DEXTROMETHORPHAN-GUAIFENESIN (MUCINEX DM) 30-600 MG PER 12 HR TABLET    Take 1-2 tablets by mouth every 12 (twelve) hours.    FLUTICASONE-SALMETEROL (ADVAIR DISKUS) 250-50 MCG/DOSE AEPB    Inhale 1 puff into the lungs 2 (two) times daily.   FUROSEMIDE (LASIX) 20 MG TABLET    Take 3 tablets (60 mg  total) by mouth daily.   INSULIN GLARGINE (LANTUS) 100 UNIT/ML INJECTION    Inject 70 Units into the skin daily.    LISINOPRIL (PRINIVIL,ZESTRIL) 5 MG TABLET    Take 1 tablet (5 mg total) by mouth daily.   MULTIPLE VITAMIN (MULTIVITAMIN WITH MINERALS) TABS    Take 1 tablet by mouth daily. Centrum Silver   PANTOPRAZOLE (PROTONIX) 40 MG TABLET    Take 40 mg by mouth daily.   POLYETHYLENE GLYCOL (MIRALAX / GLYCOLAX) PACKET    Take 17 g by mouth daily as needed (constipation).   POTASSIUM CHLORIDE SA (K-DUR,KLOR-CON) 10 MEQ TABLET    Take 1 tablet (10 mEq total) by mouth every other day.   PROMETHAZINE (PHENERGAN) 25 MG TABLET    Take 25 mg by mouth 2 (two) times daily as needed for nausea.   QUETIAPINE (SEROQUEL) 100 MG TABLET    Take 400 mg by mouth at bedtime.    ROPINIROLE (REQUIP) 4 MG TABLET    Take 4 mg by mouth at bedtime.   SALINE (OCEAN NASAL SPRAY NA)    Place 1 spray into the nose daily as needed (congestion).   SIMETHICONE (GAS-X PO)    Take 1 tablet by mouth daily. Takes after the biggest meal of the day   SIMVASTATIN (ZOCOR) 40 MG TABLET    Take 40 mg by mouth at bedtime.   VITAMIN D, ERGOCALCIFEROL, (DRISDOL) 50000 UNITS CAPS    Take 1 capsule (50,000 Units total) by mouth every 7 (seven) days. Take on friday  Modified Medications   No medications on file  Discontinued Medications   HYDROCORTISONE CREAM (NEOSPORIN ECZEMA ESSENT MAX ST) 1 %    Apply 1 application topically at bedtime. For rash on back and shoulders

## 2013-02-27 ENCOUNTER — Other Ambulatory Visit: Payer: Self-pay | Admitting: Pulmonary Disease

## 2013-03-01 ENCOUNTER — Telehealth: Payer: Self-pay | Admitting: Pulmonary Disease

## 2013-03-01 ENCOUNTER — Other Ambulatory Visit: Payer: Self-pay | Admitting: Pulmonary Disease

## 2013-03-01 ENCOUNTER — Telehealth: Payer: Self-pay

## 2013-03-01 DIAGNOSIS — D500 Iron deficiency anemia secondary to blood loss (chronic): Secondary | ICD-10-CM

## 2013-03-01 MED ORDER — FLUTICASONE-SALMETEROL 250-50 MCG/DOSE IN AEPB: 1.0000 | INHALATION_SPRAY | Freq: Two times a day (BID) | RESPIRATORY_TRACT | Status: DC

## 2013-03-01 NOTE — Telephone Encounter (Signed)
French Ana with [facility] called,she is at pt's home and pt's cbg not reading on meter this am, cbg has been 200's- 300's, please advise [phone]

## 2013-03-01 NOTE — Telephone Encounter (Signed)
This is being handled in another message.  Will close this message.

## 2013-03-01 NOTE — Telephone Encounter (Signed)
Called and spoke with pts spouse and he is aware of samples being left up front for the pt.  Nothing further is needed.

## 2013-03-01 NOTE — Telephone Encounter (Signed)
Spoke with Kennith Center with [facility] while she was at pts house and advised of lab results..  She was concerned about pt's high CBG readings but she has a call in to Dr Everardo All regarding this..  Instructed that we will call pt regarding iron injections.   Leigh,  Please advise on how to order injections

## 2013-03-01 NOTE — Telephone Encounter (Signed)
Called and spoke with French Ana. When she left pt's house, sugar decreased from HI to 280.  Advised to split Lantus in 2 doses of 35 (might not absorb the whole 70 units) and to advised to keep needle in for 10 sec after last drop of insulin in.

## 2013-03-02 ENCOUNTER — Other Ambulatory Visit: Payer: Self-pay | Admitting: Pulmonary Disease

## 2013-03-02 ENCOUNTER — Other Ambulatory Visit: Payer: Self-pay | Admitting: Cardiovascular Disease

## 2013-03-02 DIAGNOSIS — D649 Anemia, unspecified: Secondary | ICD-10-CM

## 2013-03-03 ENCOUNTER — Ambulatory Visit: Payer: Self-pay | Admitting: Neurology

## 2013-03-03 ENCOUNTER — Telehealth: Payer: Self-pay | Admitting: Pulmonary Disease

## 2013-03-03 NOTE — Telephone Encounter (Signed)
Marcelino Duster called requesting to speak with nurse  Spoke with Marcelino Duster who stated that she is at pt's home now > when she got there pt was on 2.5L instead of 3lpm and sats = 83-84%.  Michelle increased O2 to 3lpm and sats increased to 98%.  Walked pt x3-4 minutes on 3lpm and sats decreased to 89-91%.  Marcelino Duster did note that pt walked more than she did last time.  Per Marcelino Duster, per 6.24.14 order pt was to be ambulated on 3lpm with the exertional sats called/reported to SN.  Placed Marcelino Duster on hold and spoke with SN  Per SN: this is really good for Filippa; the most she's walked in a while.  Let's leave her O2 where it is; no changes.  Spoke back with Marcelino Duster and advised of SN's recs.  Marcelino Duster verbalized her understanding and denied any further questions/concerns.  Nothing further needed; will sign off.

## 2013-03-03 NOTE — Telephone Encounter (Signed)
Called and spoke with pt and she is aware of lab results per SN.  Pt is aware of iron infusion set up for next week and pt will keep her appt with SN on 8-4. Nothing further is needed.

## 2013-03-05 ENCOUNTER — Telehealth: Payer: Self-pay | Admitting: Endocrinology

## 2013-03-05 NOTE — Telephone Encounter (Signed)
Kizzie Ide, nurse with [facility] and advised her to have pt to continue the metformin. That she might need a short acting insulin-per Dr Everardo All. She has a follow up coming up and they can discuss then. Nurse understood.

## 2013-03-05 NOTE — Telephone Encounter (Signed)
[name], Nurse with [facility] called and states that pt's sugars are still high 380-400. Patient resumed taking metformin bid on her own and that brought her sugars down to 220. Please advise in Dr George Hugh absence.

## 2013-03-05 NOTE — Telephone Encounter (Signed)
Would continue Metformin. Might need short acting insulin - per Dr. Everardo All.

## 2013-03-09 ENCOUNTER — Encounter (HOSPITAL_COMMUNITY): Admission: RE | Admit: 2013-03-09 | Payer: Medicare Other | Source: Ambulatory Visit

## 2013-03-09 ENCOUNTER — Other Ambulatory Visit (HOSPITAL_COMMUNITY): Payer: Self-pay | Admitting: Pulmonary Disease

## 2013-03-10 ENCOUNTER — Telehealth: Payer: Self-pay | Admitting: Pulmonary Disease

## 2013-03-10 ENCOUNTER — Encounter: Payer: Self-pay | Admitting: *Deleted

## 2013-03-10 NOTE — Telephone Encounter (Signed)
Per SN---  Ok for the order to recert for another 60 days.  Cont to monitor BS.   thanks

## 2013-03-10 NOTE — Telephone Encounter (Signed)
Traci returning call

## 2013-03-10 NOTE — Telephone Encounter (Signed)
[name] from short stay called. Pt missed her ferheme infusion yesterday. I called [name] pt nurse. Pt thought this was for today. [name] will call pt and see when she can go. Pt has another doctor appt tomorrow. They will call back once they can get this R/S nothing further was needed

## 2013-03-10 NOTE — Telephone Encounter (Signed)
lmomtcb x1 for traci

## 2013-03-10 NOTE — Telephone Encounter (Signed)
Called, spoke with Traci with [facility]: 1.  Pt fell yesterday.  Reports pt's legs gave out and pt "leaned" into the washing machine.  Her chest hit the side of the washer.  She did not hit her head.  Pt does c/o chest being tender to touch but not bad enough for pain meds.  There is no bruising or pain upon inspiration.  Reporting this per protocol. 2.  Order expires next week.  Requesting order for re certificiation for another 60 days.  Pt's blood sugar still fluctuates.  Traci would like to cont to monitor this.  SN, pls advise if this is ok.  Thank you.  ** when calling Traci back, pls leave detailed msg on above # per her request.  Thank you.

## 2013-03-10 NOTE — Telephone Encounter (Signed)
Left detailed message on named VM. Advised to call back if she has nay questions

## 2013-03-11 ENCOUNTER — Encounter: Payer: Self-pay | Admitting: Neurology

## 2013-03-11 ENCOUNTER — Ambulatory Visit (INDEPENDENT_AMBULATORY_CARE_PROVIDER_SITE_OTHER): Payer: Medicare Other | Admitting: Neurology

## 2013-03-11 VITALS — BP 145/63 | HR 81 | Resp 18 | Ht 63.0 in | Wt 210.0 lb

## 2013-03-11 DIAGNOSIS — I5032 Chronic diastolic (congestive) heart failure: Secondary | ICD-10-CM | POA: Insufficient documentation

## 2013-03-11 DIAGNOSIS — R0602 Shortness of breath: Secondary | ICD-10-CM

## 2013-03-11 DIAGNOSIS — R296 Repeated falls: Secondary | ICD-10-CM | POA: Insufficient documentation

## 2013-03-11 DIAGNOSIS — I509 Heart failure, unspecified: Secondary | ICD-10-CM

## 2013-03-11 NOTE — Patient Instructions (Signed)
Heart Failure  Heart failure is a condition in which the heart has trouble pumping blood. This means your heart does not pump blood efficiently for your body to work well. In some cases of heart failure, fluid may back up into your lungs or you may have swelling (edema) in your lower legs. Heart failure is a long-term (chronic) condition. It is important for you to take good care of yourself and follow your caregiver's treatment plan.  CAUSES    Health conditions:   High blood pressure (hypertension) causes the heart muscle to work harder than normal. When pressure in the blood vessels is high, the heart needs to pump (contract) with more force in order to circulate blood throughout the body. High blood pressure eventually causes the heart to become stiff and weak.   Coronary artery disease (CAD) is the buildup of cholesterol and fat (plaque) in the arteries of the heart. The blockage in the arteries deprives the heart muscle of oxygen and blood. This can cause chest pain and may lead to a heart attack. High blood pressure can also contribute to CAD.   Heart attack (myocardial infarction) occurs when 1 or more arteries in the heart become blocked. The loss of oxygen damages the muscle tissue of the heart. When this happens, part of the heart muscle dies. The injured tissue does not contract as well and weakens the heart's ability to pump blood.   Abnormal heart valves can cause heart failure when the heart valves do not open and close properly. This makes the heart muscle pump harder to keep the blood flowing.   Heart muscle disease (cardiomyopathy or myocarditis) is damage to the heart muscle from a variety of causes. These can include drug or alcohol abuse, infections, or unknown reasons. These can increase the risk of heart failure.   Lung disease makes the heart work harder because the lungs do not work properly. This can cause a strain on the heart, leading it to fail.    Diabetes increases the risk of heart failure. High blood sugar contributes to high fat (lipid) levels in the blood. Diabetes can also cause slow damage to tiny blood vessels that carry important nutrients to the heart muscle. When the heart does not get enough oxygen and food, it can cause the heart to become weak and stiff. This leads to a heart that does not contract efficiently.   Other diseases can contribute to heart failure. These include abnormal heart rhythms, thyroid problems, and low blood counts (anemia).   Unhealthy behaviors:   Being overweight.   Smoking or chewing tobacco.   Eating foods high in fat and cholesterol.   Eating foods or drinking beverages high in salt.   Abusing drugs or alcohol.   Lacking physical activity.  SYMPTOMS   Heart failure symptoms may vary and can be hard to detect. Symptoms may include:   Shortness of breath with activity, such as climbing stairs.   Persistent cough.   Swelling of the feet, ankles, legs, or abdomen.   Unexplained, sudden weight gain.   Difficulty breathing when lying flat (orthopnea).   Waking from sleep because of the need to sit up and get more air.   Rapid heartbeat.   Fatigue and loss of energy.   Feeling lightheaded, dizzy, or close to fainting.   Loss of appetite.   Nausea.   Increased urination during the night (nocturia).  DIAGNOSIS   A diagnosis of heart failure is based on your history, symptoms, physical   examination, and diagnostic tests.  Diagnostic tests for heart failure may include:   Electrocardiography.   Chest X-ray.   Blood tests.   Exercise stress test.   Echocardiography.   Cardiac angiography.   Radionuclide scans.  TREATMENT   Treatment is aimed at managing the symptoms of heart failure. Medicines, behavioral changes, or surgical intervention may be necessary to treat heart failure.   Medicines to help treat heart failure may include:    Angiotensin-converting enzyme (ACE) inhibitors. This type of medicine blocks the effects of a blood protein called angiotensin-converting enzyme. ACE inhibitors relax (dilate) the blood vessels and help lower blood pressure.   Angiotensin receptor blockers. This type of medicine blocks the actions of a blood protein called angiotensin. Angiotensin receptor blockers dilate the blood vessels and help lower blood pressure.   Aldosterone antagonists. This type of medicine helps rid your body of extra fluid. This loss of extra fluid lowers the volume of blood the heart pumps.   Water pills (diuretics). Diuretics cause the kidneys to remove salt and water from the blood. The extra fluid is removed through urination.   Beta blockers. These prevent the heart from beating too fast and improve heart muscle strength.   Digitalis. This increases the force of the heartbeat.   Healthy behavior changes include:   Obtaining and maintaining a healthy weight.   Stopping smoking or chewing tobacco.   Eating heart healthy foods.   Limiting or avoiding alcohol.   Stopping drug use.   Becoming as physically active as possible.   Surgical treatment for heart failure may include:   A procedure to open blocked arteries, repair damaged heart valves, or remove damaged heart muscle tissue.   A pacemaker to improve heart muscle function and control certain abnormal heart rhythms.   An internal cardioverter defibrillator to possibly prevent sudden cardiac death.   A left ventricular assist device to assist the pumping ability of the heart.  HOME CARE INSTRUCTIONS    Take your medicine as directed by your caregiver. Medicines are important in reducing the workload of your heart, slowing the progression of heart failure, and improving your symptoms.   Do not stop taking your medicine unless directed by your caregiver.   Do not skip any dose of medicine.    Refill your prescriptions before you run out of medicine. Your medicines are needed every day.   Take over-the-counter medicine only as directed by your caregiver or pharmacist.   Stay physically active. Your caregiver or cardiac rehabilitation program can help determine your level of physical activity. It is important to follow your specific physical activity program. Regular physical activity can control weight and improve your energy, endurance, health, cholesterol levels, mood, and nighttime sleep. It is important to pace your physical activity to avoid shortness of breath or chest pain. It is recommended to rest for 1 hour before and after meals.   Eat heart healthy foods. Food choices should be low in saturated fat, trans fat, cholesterol, and salt (sodium). Healthy choices include fresh or frozen fruits and vegetables, fish, lean meats, legumes, fat-free or low-fat dairy products, and whole grain or high fiber foods. Limit your sodium to 1500 milligrams (mg) each day or as recommended by your caregiver. Talk to a dietitian to learn more about heart healthy foods and healthy seasonings.   Use healthy cooking methods. Healthy cooking methods include roasting, grilling, broiling, baking, poaching, steaming, or stir-frying. Talk to a dietitian to learn more about healthy cooking methods.     Limit fluids if directed by your caregiver. Fluid restriction may reduce symptoms of heart failure in some individuals.   Weigh yourself every day. Daily weights are important in the early recognition of excess fluid. You should weigh yourself every morning after you urinate and before you eat breakfast. Wear the same amount of clothing each time you weigh yourself. Record your daily weight. Provide your caregiver with your weight record.   Monitor and record your blood pressure if directed by your caregiver.   Check your pulse if directed by your caregiver.    If you are overweight, it is time to lose and maintain a healthy weight.   Stop smoking or chewing tobacco. Nicotine makes your heart work harder by causing your blood vessels to constrict. Do not use nicotine gum or patches before talking to your caregiver.   Schedule and attend follow-up visits as directed by your caregiver. It is important to keep all your appointments.   Limit alcohol intake to no more than 1 drink per day for nonpregnant women and 2 drinks per day for men. Drinking more than that is harmful to your heart. Tell your caregiver if you drink alcohol several times a week. Talk with your caregiver about whether alcohol is safe for you. If your heart has already been damaged by alcohol or you have severe heart failure, drinking alcohol should be stopped completely.   Stop illegal drug use.   Stay up-to-date with immunizations. It is especially important to prevent respiratory infections through current pneumococcal and influenza immunizations.   Manage other health conditions such as hypertension, diabetes, thyroid disease, or abnormal heart rhythms as directed by your caregiver.   Learn to manage stress.   Plan rest periods when fatigued.   Learn strategies to manage high temperatures. If the weather is extremely hot:   Avoid vigorous physical activity.   Use air conditioning or fans or seek a cooler location.   Avoid caffeine and alcohol.   Wear loose-fitting, lightweight, and light-colored clothing.   Learn strategies to manage cold temperatures. If the weather is extremely cold:   Avoid vigorous physical activity.   Layer clothes.   Wear mittens or gloves, a hat, and a scarf when going outside.   Avoid alcohol.   Obtain ongoing education and support as needed.   Participate or seek rehabilitation as needed to maintain or improve independence and quality of life.  SEEK MEDICAL CARE IF:    Your weight increases by 3 lb/1.4 kg in 1 day or 5 lb/2.3 kg in a week.    You have increasing shortness of breath that is unusual for you.   You are unable to participate in your usual physical activities.   You tire easily.   You cough more than normal, especially with physical activity.   You have any or more swelling in areas such as your hands, feet, ankles, or abdomen.   You are unable to sleep because it is hard to breathe.   You cough up bloody mucus (sputum).   You feel like your heart is beating fast (palpitations).   You become dizzy or lightheaded upon standing up.  SEEK IMMEDIATE MEDICAL CARE IF:    You have difficulty breathing.   There is a change in mental status such as decreased alertness or difficulty with concentration.   You have a pain or discomfort in your chest.   You have an episode of fainting (syncope).   You are in an area of high temperatures and   you have signs of heat exhaustion:   Heavy sweating.   Muscle cramps.   Weakness.   Dizziness.   Headaches.   Fainting.   You are in an area of cold temperatures and you have signs of hypothermia:   Clumsiness.   Confusion.   Sleepiness.   Shivering.  MAKE SURE YOU:    Understand these instructions.   Will watch your condition.   Will get help right away if you are not doing well or get worse.  Document Released: 08/12/2005 Document Revised: 07/29/2012 Document Reviewed: 03/12/2012  [employer] Patient Information 2014 [employer].

## 2013-03-11 NOTE — Progress Notes (Signed)
[facility]  Provider:  Dr Niv Darley Referring Provider: Michele Mcalpine, MD Primary Care Physician:  Michele Mcalpine, MD  Chief Complaint  Patient presents with  . Tremors / Sleep    # 11 Revisit    HPI:  [name] is a 72 y.o. female here as a revisit  From her last visit 07-2012 with [name]-  Dr. Kriste Basque is her PCP. She has restless legs.  The patient begun treatment in December 2010 with  Requip,  which initially much improved restless legs and tremors- But  by the time she revisited with Dr. Kriste Basque  in December 2011 she had needed for 6 mg of Requip daily to have the desired effect.  The patient is an extensive medical history including a hospitalization for hallucinations she had to be taken of Requip at the time, and she  Was afterwards  unable to initiate sleep after the Requip was discontinued . The hallucinations were described as visual hallucinations a feeling that bugs were crawling all for her. 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 She was started by Dr Hortencia Conradi  in early 2013 on seroquell which helped her for about 6-12 months to sleep, but then was not longer providing the effect . She has been using it continuously as well as Sinemet and has not restarted any dopaminergic agonists . She reports falls earlier this year,  but denies any loss of bladder or bowel control and she is using a walker all the time now to prevent falls  the patient requires not continuous infusions of iron she was diagnosed as ulcerative changes and the duodenum and gastric cavity, she also has been diagnosed with congestive heart failure.  Her past medical history is positive for obesity, cirrhosis of the liver, restless legs, medication-induced psychosis, COPD, depression anxiety, diabetes, gastric ulcers and she continues to receive blood  transfusions.  The patient is currently taking Seroquel  at 400 mg at night- Dr. March Rummage prescribes this medication.   At the end of the prolonged interview,that patient reports taking Requip again, at 4 mg nightly !!  Dr Kriste Basque prescribes this. She reports no longer RLS sympoms on this dose.    Review of Systems: Out of a complete 14 system review, the patient complains of only the following symptoms, and all other reviewed systems are negative.   dry burning mouth, obesity , SOB , falls.    History   Social History  . Marital Status: Married    Spouse Name: [name]    Number of Children: 4  . Years of Education: 12   Occupational History  . Disabled   .     Social History Main Topics  . Smoking status: Former Smoker -- 1.50 packs/day for 25 years    Types: Cigarettes    Quit date: 08/26/1992  . Smokeless tobacco: Never Used  . Alcohol Use: No     Comment: 11/22/11 "last alcoholic drink was years ago"  . Drug Use: No  . Sexually Active: No   Other Topics Concern  . Not on file   Social History Narrative   Patient lives at home with her husband [name]).    Retired.   Caffeine- None    Right handed.    Family History  Problem Relation Age of Onset  . Heart disease Mother   . Cervical cancer  Mother   . Kidney disease Mother   . Diabetes Mother   . Breast cancer Sister   . Multiple sclerosis Sister   . Colon cancer Neg Hx   . Breast cancer Maternal Aunt     x 2  . Diabetes Father   . Diabetes Sister   . Multiple sclerosis Other     Past Medical History  Diagnosis Date  . Chronic airway obstruction, not elsewhere classified   . Unspecified chronic bronchitis   . Unspecified essential hypertension   . Coronary atherosclerosis of unspecified type of vessel, native or graft   . Palpitations   . Aortic valve disorders   . Pure hypercholesterolemia   . Morbid obesity   . Esophageal reflux   . Angiodysplasia of intestine (without mention of hemorrhage)    . Diverticulosis of colon (without mention of hemorrhage)   . Benign neoplasm of colon   . Autoimmune hepatitis   . Cystitis, unspecified   . Osteoarthrosis, unspecified whether generalized or localized, unspecified site   . Osteoporosis, unspecified   . Restless legs syndrome (RLS)   . Major depressive disorder, recurrent episode, severe, specified as with psychotic behavior   . Iron deficiency anemia secondary to blood loss (chronic)   . Coarse tremors     "just in my arms"  . Carpal tunnel syndrome on both sides   . Dysrhythmia     palpitations  . CHF (congestive heart failure)   . Asthma   . Shortness of breath     "any time really"  . Sleep apnea     "but I don't use the equipment"  . Type II diabetes mellitus   . Blood transfusion   . H/O hiatal hernia   . 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 Adenomatous colon polyp   . Anginal pain   . Anxiety   . Pneumonia   . Heart murmur   . AVM (arteriovenous malformation)   . Esophageal varices   . Portal hypertensive gastropathy   . Falls frequently     Past Surgical History  Procedure Laterality Date  . Hemorroidectomy    . Appendectomy    . Vaginal hysterectomy    . Breast biopsy      bilateral  . Cesarean section      x 3  . Appendectomy    . Esophagogastroduodenoscopy  06/12/2012    Procedure: ESOPHAGOGASTRODUODENOSCOPY (EGD);  Surgeon: Hart Carwin, MD;  Location: [facility];  Service: Endoscopy;  Laterality: N/A;  . Balloon dilation  06/12/2012    Procedure: BALLOON DILATION;  Surgeon: Hart Carwin, MD;  Location: [facility];  Service: Endoscopy;  Laterality: N/A;  ?balloon  . Esophagogastroduodenoscopy  09/10/2012    Procedure: ESOPHAGOGASTRODUODENOSCOPY (EGD);  Surgeon: Hart Carwin, MD;  Location: [facility];  Service: Endoscopy;  Laterality: N/A;  . Hot hemostasis  09/10/2012    Procedure: HOT HEMOSTASIS (ARGON PLASMA COAGULATION/BICAP);  Surgeon: Hart Carwin, MD;  Location: [facility];   Service: Endoscopy;  Laterality: N/A;  . Esophagogastroduodenoscopy N/A 01/18/2013    Procedure: ESOPHAGOGASTRODUODENOSCOPY (EGD);  Surgeon: Hart Carwin, MD;  Location: [facility];  Service: Endoscopy;  Laterality: N/A;    Current Outpatient Prescriptions  Medication Sig Dispense Refill  . albuterol (PROAIR HFA) 108 (90 BASE) MCG/ACT inhaler Inhale 2 puffs into the lungs every 6 (six) hours as needed for shortness of breath.       . ALPRAZolam (XANAX) 1 MG tablet Take 1  mg by mouth 2 (two) times daily as needed for anxiety.       [name] atenolol (TENORMIN) 50 MG tablet Take 50 mg by mouth daily.      [name] atenolol (TENORMIN) 50 MG tablet TAKE 1 TABLET BY MOUTH EVERY DAY  30 tablet  10  . atenolol (TENORMIN) 50 MG tablet TAKE 1 TABLET BY MOUTH EVERY DAY  30 tablet  10  . b complex vitamins tablet Take 1 tablet by mouth daily.       . B-D ULTRAFINE III SHORT PEN 31G X 8 MM MISC USE AS DIRECTED  100 each  3  . buPROPion (WELLBUTRIN XL) 150 MG 24 hr tablet Take 150 mg by mouth daily.      . carbidopa-levodopa (SINEMET CR) 25-100 MG per tablet Take 1 tablet by mouth 2 (two) times daily. Take with lunch and dinner      . desvenlafaxine (PRISTIQ) 100 MG 24 hr tablet Take 100 mg by mouth daily.      [name] dextromethorphan-guaiFENesin (MUCINEX DM) 30-600 MG per 12 hr tablet Take 1-2 tablets by mouth every 12 (twelve) hours.       . Fluticasone-Salmeterol (ADVAIR DISKUS) 250-50 MCG/DOSE AEPB Inhale 1 puff into the lungs 2 (two) times daily.  28 each  0  . FREESTYLE LITE test strip TEST TWICE A DAY  50 each  6  . furosemide (LASIX) 20 MG tablet Take 3 tablets (60 mg total) by mouth daily.  90 tablet  0  . insulin glargine (LANTUS) 100 UNIT/ML injection Inject 70 Units into the skin daily.       [name] lisinopril (PRINIVIL,ZESTRIL) 5 MG tablet Take 1 tablet (5 mg total) by mouth daily.  30 tablet  0  . Multiple Vitamin (MULTIVITAMIN WITH MINERALS) TABS Take 1 tablet by mouth daily. Centrum Silver      . Multiple  Vitamins-Minerals (CENTRUM SILVER PO) Take by mouth daily.      . pantoprazole (PROTONIX) 40 MG tablet Take 40 mg by mouth daily.      . polyethylene glycol (MIRALAX / GLYCOLAX) packet Take 17 g by mouth daily as needed (constipation).      . potassium chloride SA (K-DUR,KLOR-CON) 10 MEQ tablet Take 1 tablet (10 mEq total) by mouth every other day.  30 tablet  1  . promethazine (PHENERGAN) 25 MG tablet Take 25 mg by mouth 2 (two) times daily as needed for nausea.      [name] QUEtiapine (SEROQUEL) 100 MG tablet Take 400 mg by mouth at bedtime.       [name] rOPINIRole (REQUIP) 4 MG tablet Take 4 mg by mouth at bedtime.      . Saline (OCEAN NASAL SPRAY NA) Place 1 spray into the nose daily as needed (congestion).      . Simethicone (GAS-X PO) Take 1 tablet by mouth daily. Takes after the biggest meal of the day      . simvastatin (ZOCOR) 40 MG tablet Take 40 mg by mouth at bedtime.      . Vitamin D, Ergocalciferol, (DRISDOL) 50000 UNITS CAPS Take 1 capsule (50,000 Units total) by mouth every 7 (seven) days. Take on friday  5 capsule  11   No current facility-administered medications for this visit.    Allergies as of 03/11/2013 - Review Complete 03/11/2013  Allergen Reaction Noted  . Aspirin Other (See Comments) 12/11/2010  . Penicillins Itching   . Theophylline Nausea And Vomiting     Vitals: BP 145/63  Pulse 81  Resp 18  Ht 5\' 3"  (1.6 m)  Wt 210 lb (95.255 kg)  BMI 37.21 kg/m2 Last Weight:  Wt Readings from Last 1 Encounters:  03/11/13 210 lb (95.255 kg)   Last Height:   Ht Readings from Last 1 Encounters:  03/11/13 5\' 3"  (1.6 m)     Physical exam:  General: The patient is awake, alert and appears not in acute distress. The patient is well groomed. Head: Normocephalic, atraumatic. Neck is supple. Respiratory: Lungs are clear to auscultation. Patient SOB when speaking.  Skin: pale , no rash, no ankle edema . Trunk: BMI is elevated .  Neurologic exam : The patient is awake and  alert, oriented to place and . Mood and affect are appropriate.  Cranial nerves: Pupils are equal and briskly reactive to light. Extraocular movements  in vertical and horizontal planes intact and without nystagmus. Visual fields by finger perimetry are intact. Hearing to finger rub intact.  Facial sensation intact to fine touch. Facial motor strength is symmetric and tongue and uvula move midline.  Motor exam:  No rigor or cogwheeling - Normal tone and normal muscle bulk .  Sensory:  Fine touch, pinprick and vibration were tested in all extremities.  Decreased  Primary modalities in all toes. Proprioception is tested in the upper extremities only. This was  normal.  Coordination: Rapid alternating movements in the fingers/hands is tested and normal. Finger-to-nose maneuver tested  with evidence of ataxia, dysmetria and  tremor.  Gait and station: Patient walks with a walker for an  assistive device-  Sob , on oxygen  24/7 -  Deep tendon reflexes: in the  upper and lower extremities are attenuated . Babinski maneuver response is  downgoing.   Assessment:   [place] gait disorder of I believe that the patient would not need Sinemet and daytime if she is treated with the Requip for restless max. She has previously not noticed an improvement in the tremor from Sinemet along. We can be provided through her primary care physician the current dose is working for the patient's older is no increase needed. She reported that Dr. Christene Lye heart her Zocor to a total of 4 and she still is often in stomach in spite of the high dose medication. Given her shortness of breath and congestive heart failure,  I think it would be dangerous to introduce any benzodiazepine related sleep aid or any pain medication that contains narcotic elements.   The patient is oxygen dependent 24 / 7. She remains orthopneic and needs several pillows to be propped up to sleep comfortably this while on oxygen. She remains with  brittle diabetes, and is daytime fatigued.   No follow up in the sleep clinic is needed.  Referral to PCP.   400 seroquel mg at night,   Plan:  Treatment plan and additional workup will be reviewed under Problem List.  Insomnia, hypersomnia, polypharmacy

## 2013-03-15 ENCOUNTER — Other Ambulatory Visit: Payer: Self-pay | Admitting: Pulmonary Disease

## 2013-03-16 ENCOUNTER — Encounter (HOSPITAL_COMMUNITY): Payer: Self-pay

## 2013-03-16 ENCOUNTER — Encounter (HOSPITAL_COMMUNITY)
Admission: RE | Admit: 2013-03-16 | Discharge: 2013-03-16 | Disposition: A | Discharge status: Home / Self Care | Payer: Medicare Other | Source: Ambulatory Visit | Attending: Pulmonary Disease | Admitting: Pulmonary Disease

## 2013-03-16 VITALS — BP 152/65 | HR 61 | Temp 98.8°F | Resp 20 | Ht 63.0 in | Wt 211.0 lb

## 2013-03-16 DIAGNOSIS — D649 Anemia, unspecified: Secondary | ICD-10-CM

## 2013-03-16 DIAGNOSIS — D509 Iron deficiency anemia, unspecified: Secondary | ICD-10-CM | POA: Insufficient documentation

## 2013-03-16 MED ORDER — FERUMOXYTOL INJECTION 510 MG/17 ML
510.0000 mg | Freq: Once | INTRAVENOUS | Status: AC
  Administered 2013-03-16: 510 mg via INTRAVENOUS
  Filled 2013-03-16: qty 17

## 2013-03-16 MED ORDER — SODIUM CHLORIDE 0.9 % IV SOLN
Freq: Once | INTRAVENOUS | Status: AC
  Administered 2013-03-16: 13:00:00 via INTRAVENOUS

## 2013-03-16 NOTE — Progress Notes (Signed)
Feraheme infusion complete.  Pt tolerated well.  Infusion uneventful.

## 2013-03-19 ENCOUNTER — Ambulatory Visit (INDEPENDENT_AMBULATORY_CARE_PROVIDER_SITE_OTHER): Payer: Medicare Other | Admitting: Endocrinology

## 2013-03-19 ENCOUNTER — Encounter: Payer: Self-pay | Admitting: Endocrinology

## 2013-03-19 VITALS — BP 132/80 | HR 76 | Ht 64.0 in | Wt 211.0 lb

## 2013-03-19 DIAGNOSIS — E1049 Type 1 diabetes mellitus with other diabetic neurological complication: Secondary | ICD-10-CM

## 2013-03-19 NOTE — Progress Notes (Signed)
Subjective:    Patient ID: [name], female    DOB: 1941-05-21, 72 y.o.   MRN: [id number]  HPI pt returns for f/u of insulin-requiring DM (dx'ed 2004; she has moderate neuropathy of the lower extremities; she has associated complications of CAD; the interpretation of a1c's has been limited by blood transfusions). She feels better since her recent 2 hospitalizations, for CHF, since her last ov here.  she brings a record of her cbg's which i have reviewed today.  It varies from 179-400.  There is no trend throughout the day.  Past Medical History  Diagnosis Date  . Chronic airway obstruction, not elsewhere classified   . Unspecified chronic bronchitis   . Unspecified essential hypertension   . Coronary atherosclerosis of unspecified type of vessel, native or graft   . Palpitations   . Aortic valve disorders   . Pure hypercholesterolemia   . Morbid obesity   . Esophageal reflux   . Angiodysplasia of intestine (without mention of hemorrhage)   . Diverticulosis of colon (without mention of hemorrhage)   . Benign neoplasm of colon   . Autoimmune hepatitis   . Cystitis, unspecified   . Osteoarthrosis, unspecified whether generalized or localized, unspecified site   . Osteoporosis, unspecified   . Restless legs syndrome (RLS)   . Major depressive disorder, recurrent episode, severe, specified as with psychotic behavior   . Iron deficiency anemia secondary to blood loss (chronic)   . Coarse tremors     "just in my arms"  . Carpal tunnel syndrome on both sides   . Dysrhythmia     palpitations  . CHF (congestive heart failure)   . Asthma   . Shortness of breath     "any time really"  . Sleep apnea     "but I don't use the equipment"  . Type II diabetes mellitus   . Blood transfusion   . H/O hiatal hernia   . 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 Adenomatous colon polyp   . Anginal pain   . Anxiety   . Pneumonia   . Heart murmur   . AVM (arteriovenous malformation)   .  Esophageal varices   . Portal hypertensive gastropathy   . Falls frequently     Past Surgical History  Procedure Laterality Date  . Hemorroidectomy    . Appendectomy    . Vaginal hysterectomy    . Breast biopsy      bilateral  . Cesarean section      x 3  . Appendectomy    . Esophagogastroduodenoscopy  06/12/2012    Procedure: ESOPHAGOGASTRODUODENOSCOPY (EGD);  Surgeon: Hart Carwin, MD;  Location: [facility];  Service: Endoscopy;  Laterality: N/A;  . Balloon dilation  06/12/2012    Procedure: BALLOON DILATION;  Surgeon: Hart Carwin, MD;  Location: [facility];  Service: Endoscopy;  Laterality: N/A;  ?balloon  . Esophagogastroduodenoscopy  09/10/2012    Procedure: ESOPHAGOGASTRODUODENOSCOPY (EGD);  Surgeon: Hart Carwin, MD;  Location: [facility];  Service: Endoscopy;  Laterality: N/A;  . Hot hemostasis  09/10/2012    Procedure: HOT HEMOSTASIS (ARGON PLASMA COAGULATION/BICAP);  Surgeon: Hart Carwin, MD;  Location: [facility];  Service: Endoscopy;  Laterality: N/A;  . Esophagogastroduodenoscopy N/A 01/18/2013    Procedure: ESOPHAGOGASTRODUODENOSCOPY (EGD);  Surgeon: Hart Carwin, MD;  Location: [facility]COPY;  Service: Endoscopy;  Laterality: N/A;    History   Social History  . Marital Status: Married    Spouse  Name: [name]    Number of Children: 4  . Years of Education: 12   Occupational History  . Disabled   .     Social History Main Topics  . Smoking status: Former Smoker -- 1.50 packs/day for 25 years    Types: Cigarettes    Quit date: 08/26/1992  . Smokeless tobacco: Never Used  . Alcohol Use: No     Comment: 11/22/11 "last alcoholic drink was years ago"  . Drug Use: No  . Sexually Active: No   Other Topics Concern  . Not on file   Social History Narrative   Patient lives at home with her husband [name]).    Retired.   Caffeine- None    Right handed.    Current Outpatient Prescriptions on File Prior to Visit  Medication Sig Dispense Refill   . albuterol (PROAIR HFA) 108 (90 BASE) MCG/ACT inhaler Inhale 2 puffs into the lungs every 6 (six) hours as needed for shortness of breath.       . ALPRAZolam (XANAX) 1 MG tablet Take 1 mg by mouth 2 (two) times daily as needed for anxiety.       [name] atenolol (TENORMIN) 50 MG tablet Take 50 mg by mouth daily.      [name] atenolol (TENORMIN) 50 MG tablet TAKE 1 TABLET BY MOUTH EVERY DAY  30 tablet  10  . b complex vitamins tablet Take 1 tablet by mouth daily.       . B-D ULTRAFINE III SHORT PEN 31G X 8 MM MISC USE AS DIRECTED  100 each  3  . buPROPion (WELLBUTRIN XL) 150 MG 24 hr tablet Take 150 mg by mouth daily.      [name] desvenlafaxine (PRISTIQ) 100 MG 24 hr tablet Take 100 mg by mouth daily.      [name] dextromethorphan-guaiFENesin (MUCINEX DM) 30-600 MG per 12 hr tablet Take 1-2 tablets by mouth every 12 (twelve) hours.       . Fluticasone-Salmeterol (ADVAIR DISKUS) 250-50 MCG/DOSE AEPB Inhale 1 puff into the lungs 2 (two) times daily.  28 each  0  . FREESTYLE LITE test strip TEST TWICE A DAY  50 each  6  . furosemide (LASIX) 20 MG tablet Take 3 tablets (60 mg total) by mouth daily.  90 tablet  0  . insulin glargine (LANTUS) 100 UNIT/ML injection Inject 90 Units into the skin daily.       [name] lisinopril (PRINIVIL,ZESTRIL) 5 MG tablet Take 1 tablet (5 mg total) by mouth daily.  30 tablet  0  . Multiple Vitamin (MULTIVITAMIN WITH MINERALS) TABS Take 1 tablet by mouth daily. Centrum Silver      . Multiple Vitamins-Minerals (CENTRUM SILVER PO) Take by mouth daily.      . pantoprazole (PROTONIX) 40 MG tablet Take 40 mg by mouth daily.      . polyethylene glycol (MIRALAX / GLYCOLAX) packet Take 17 g by mouth daily as needed (constipation).      . potassium chloride SA (K-DUR,KLOR-CON) 10 MEQ tablet Take 1 tablet (10 mEq total) by mouth every other day.  30 tablet  1  . promethazine (PHENERGAN) 25 MG tablet Take 25 mg by mouth 2 (two) times daily as needed for nausea.      [name] QUEtiapine (SEROQUEL) 100 MG tablet  Take 400 mg by mouth at bedtime.       [name] rOPINIRole (REQUIP) 4 MG tablet Take 4 mg by mouth at bedtime.      [name]  Saline (OCEAN NASAL SPRAY NA) Place 1 spray into the nose daily as needed (congestion).      . Simethicone (GAS-X PO) Take 1 tablet by mouth daily. Takes after the biggest meal of the day      . simvastatin (ZOCOR) 40 MG tablet Take 40 mg by mouth at bedtime.      . Vitamin D, Ergocalciferol, (DRISDOL) 50000 UNITS CAPS TAKE 1 CAPSULE (50,000 UNITS TOTAL) BY MOUTH EVERY 7 (SEVEN) DAYS. TAKE ON FRIDAY  5 capsule  8   No current facility-administered medications on file prior to visit.    Allergies  Allergen Reactions  . Aspirin Other (See Comments)    Causes nosebleeds  . Penicillins Itching  . Theophylline Nausea And Vomiting    Family History  Problem Relation Age of Onset  . Heart disease Mother   . Cervical cancer Mother   . Kidney disease Mother   . Diabetes Mother   . Breast cancer Sister   . Multiple sclerosis Sister   . Colon cancer Neg Hx   . Breast cancer Maternal Aunt     x 2  . Diabetes Father   . Diabetes Sister   . Multiple sclerosis Other     BP 132/80  Pulse 76  Ht 5\' 4"  (1.626 m)  Wt 211 lb (95.709 kg)  BMI 36.2 kg/m2  SpO2 88%    Review of Systems denies hypoglycemia and weight change.    Objective:   Physical Exam VITAL SIGNS:  See vs page GENERAL: no distress PSYCH: Alert and oriented x 3.  Does not appear anxious nor depressed.    Assessment & Plan:  DM: she needs increased rx.  This insulin regimen was chosen from multiple options, for its simplicity.  The benefits of glycemic control must be weighed against the risks of hypoglycemia.

## 2013-03-19 NOTE — Patient Instructions (Addendum)
check your blood sugar twice a day.  vary the time of day when you check, between before the 3 meals, and at bedtime.  also check if you have symptoms of your blood sugar being too high or too low.  please keep a record of the readings and bring it to your next appointment here.  please call us sooner if your blood sugar goes below 70, or if you have a lot of readings over 200.  Also, please increase the lantus to 90 units daily. Please stop the metformin.  we will need to take this complex situation in stages.  Please come back for a follow-up appointment in 1 month.

## 2013-03-22 ENCOUNTER — Emergency Department (HOSPITAL_COMMUNITY): Payer: Medicare Other

## 2013-03-22 ENCOUNTER — Encounter (HOSPITAL_COMMUNITY): Payer: Self-pay | Admitting: Cardiology

## 2013-03-22 ENCOUNTER — Emergency Department (HOSPITAL_COMMUNITY)
Admission: EM | Admit: 2013-03-22 | Discharge: 2013-03-22 | Disposition: A | Discharge status: Home / Self Care | Payer: Medicare Other | Attending: Emergency Medicine | Admitting: Emergency Medicine

## 2013-03-22 DIAGNOSIS — E119 Type 2 diabetes mellitus without complications: Secondary | ICD-10-CM | POA: Insufficient documentation

## 2013-03-22 DIAGNOSIS — Z8709 Personal history of other diseases of the respiratory system: Secondary | ICD-10-CM | POA: Insufficient documentation

## 2013-03-22 DIAGNOSIS — F3289 Other specified depressive episodes: Secondary | ICD-10-CM | POA: Insufficient documentation

## 2013-03-22 DIAGNOSIS — I10 Essential (primary) hypertension: Secondary | ICD-10-CM | POA: Insufficient documentation

## 2013-03-22 DIAGNOSIS — Z8719 Personal history of other diseases of the digestive system: Secondary | ICD-10-CM | POA: Insufficient documentation

## 2013-03-22 DIAGNOSIS — J4489 Other specified chronic obstructive pulmonary disease: Secondary | ICD-10-CM | POA: Insufficient documentation

## 2013-03-22 DIAGNOSIS — I509 Heart failure, unspecified: Secondary | ICD-10-CM | POA: Insufficient documentation

## 2013-03-22 DIAGNOSIS — Z8701 Personal history of pneumonia (recurrent): Secondary | ICD-10-CM | POA: Insufficient documentation

## 2013-03-22 DIAGNOSIS — Z79899 Other long term (current) drug therapy: Secondary | ICD-10-CM | POA: Insufficient documentation

## 2013-03-22 DIAGNOSIS — I2510 Atherosclerotic heart disease of native coronary artery without angina pectoris: Secondary | ICD-10-CM | POA: Insufficient documentation

## 2013-03-22 DIAGNOSIS — Z87891 Personal history of nicotine dependence: Secondary | ICD-10-CM | POA: Insufficient documentation

## 2013-03-22 DIAGNOSIS — J449 Chronic obstructive pulmonary disease, unspecified: Secondary | ICD-10-CM | POA: Insufficient documentation

## 2013-03-22 DIAGNOSIS — F329 Major depressive disorder, single episode, unspecified: Secondary | ICD-10-CM | POA: Insufficient documentation

## 2013-03-22 DIAGNOSIS — N390 Urinary tract infection, site not specified: Secondary | ICD-10-CM | POA: Insufficient documentation

## 2013-03-22 LAB — BASIC METABOLIC PANEL
BUN: 27 mg/dL — ABNORMAL HIGH (ref 6–23)
Chloride: 98 mEq/L (ref 96–112)
Creatinine, Ser: 1.16 mg/dL — ABNORMAL HIGH (ref 0.50–1.10)
Glucose, Bld: 414 mg/dL — ABNORMAL HIGH (ref 70–99)
Potassium: 4.2 mEq/L (ref 3.5–5.1)

## 2013-03-22 LAB — URINALYSIS, ROUTINE W REFLEX MICROSCOPIC
Glucose, UA: >1000 mg/dL — AB
Hgb urine dipstick: NEGATIVE
Specific Gravity, Urine: 1.018 (ref 1.005–1.030)

## 2013-03-22 LAB — CBC
HCT: 30.4 % — ABNORMAL LOW (ref 36.0–46.0)
Hemoglobin: 10.1 g/dL — ABNORMAL LOW (ref 12.0–15.0)
MCHC: 33.2 g/dL (ref 30.0–36.0)
MCV: 92.4 fL (ref 78.0–100.0)
WBC: 5.4 10*3/uL (ref 4.0–10.5)

## 2013-03-22 LAB — POCT I-STAT TROPONIN I: Troponin i, poc: 0 ng/mL (ref 0.00–0.08)

## 2013-03-22 LAB — URINE MICROSCOPIC-ADD ON

## 2013-03-22 MED ORDER — DEXTROSE 5 % IV SOLN
1.0000 g | Freq: Once | INTRAVENOUS | Status: AC
  Administered 2013-03-22: 1 g via INTRAVENOUS
  Filled 2013-03-22: qty 10

## 2013-03-22 MED ORDER — CEPHALEXIN 500 MG PO CAPS: 500.0000 mg | ORAL_CAPSULE | Freq: Four times a day (QID) | ORAL | Status: DC

## 2013-03-22 NOTE — ED Notes (Signed)
Pt ambulated with pulse ox. O2 sats fluctuated between 92%-95% while on 3 L/min via nasal cannula.

## 2013-03-22 NOTE — ED Provider Notes (Signed)
CSN: [id number]     Arrival date & time 03/22/13  1151 History     First MD Initiated Contact with Patient 03/22/13 1346     Chief Complaint  Patient presents with  . Shortness of Breath  . Urinary Tract Infection   (Consider location/radiation/quality/duration/timing/severity/associated sxs/prior Treatment) Patient is a 72 y.o. female presenting with shortness of breath and urinary tract infection. The history is provided by the patient. No language interpreter was used.  Shortness of Breath Severity:  Moderate Onset quality:  Gradual Associated symptoms: no chest pain, no cough, no fever and no neck pain   Associated symptoms comment:  She is having shortness of breath that is worse than her baseline with history of O2 dependent COPD and CHF. She denies chest pain, N, V. She has been taking her Lasix as prescribed. She reports recent hospitalization for CHF exacerbation and was discharged home 3 weeks ago. Since that time her SOB has become progressively worse.  Urinary Tract Infection Pertinent negatives include no chest pain, chills, coughing, fever or neck pain.    Past Medical History  Diagnosis Date  . Chronic airway obstruction, not elsewhere classified   . Unspecified chronic bronchitis   . Unspecified essential hypertension   . Coronary atherosclerosis of unspecified type of vessel, native or graft   . Palpitations   . Aortic valve disorders   . Pure hypercholesterolemia   . Morbid obesity   . Esophageal reflux   . Angiodysplasia of intestine (without mention of hemorrhage)   . Diverticulosis of colon (without mention of hemorrhage)   . Benign neoplasm of colon   . Autoimmune hepatitis   . Cystitis, unspecified   . Osteoarthrosis, unspecified whether generalized or localized, unspecified site   . Osteoporosis, unspecified   . Restless legs syndrome (RLS)   . Major depressive disorder, recurrent episode, severe, specified as with psychotic behavior   . Iron  deficiency anemia secondary to blood loss (chronic)   . Coarse tremors     "just in my arms"  . Carpal tunnel syndrome on both sides   . Dysrhythmia     palpitations  . CHF (congestive heart failure)   . Asthma   . Shortness of breath     "any time really"  . Sleep apnea     "but I don't use the equipment"  . Type II diabetes mellitus   . Blood transfusion   . H/O hiatal hernia   . 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 Adenomatous colon polyp   . Anginal pain   . Anxiety   . Pneumonia   . Heart murmur   . AVM (arteriovenous malformation)   . Esophageal varices   . Portal hypertensive gastropathy   . Falls frequently    Past Surgical History  Procedure Laterality Date  . Hemorroidectomy    . Appendectomy    . Vaginal hysterectomy    . Breast biopsy      bilateral  . Cesarean section      x 3  . Appendectomy    . Esophagogastroduodenoscopy  06/12/2012    Procedure: ESOPHAGOGASTRODUODENOSCOPY (EGD);  Surgeon: Hart Carwin, MD;  Location: [facility];  Service: Endoscopy;  Laterality: N/A;  . Balloon dilation  06/12/2012    Procedure: BALLOON DILATION;  Surgeon: Hart Carwin, MD;  Location: [facility];  Service: Endoscopy;  Laterality: N/A;  ?balloon  . Esophagogastroduodenoscopy  09/10/2012    Procedure: ESOPHAGOGASTRODUODENOSCOPY (EGD);  Surgeon: Hart Carwin,  MD;  Location: [facility];  Service: Endoscopy;  Laterality: N/A;  . Hot hemostasis  09/10/2012    Procedure: HOT HEMOSTASIS (ARGON PLASMA COAGULATION/BICAP);  Surgeon: Hart Carwin, MD;  Location: [facility];  Service: Endoscopy;  Laterality: N/A;  . Esophagogastroduodenoscopy N/A 01/18/2013    Procedure: ESOPHAGOGASTRODUODENOSCOPY (EGD);  Surgeon: Hart Carwin, MD;  Location: [facility] ENDOSCOPY;  Service: Endoscopy;  Laterality: N/A;   Family History  Problem Relation Age of Onset  . Heart disease Mother   . Cervical cancer Mother   . Kidney disease Mother   . Diabetes Mother   . Breast cancer Sister   .  Multiple sclerosis Sister   . Colon cancer Neg Hx   . Breast cancer Maternal Aunt     x 2  . Diabetes Father   . Diabetes Sister   . Multiple sclerosis Other    History  Substance Use Topics  . Smoking status: Former Smoker -- 1.50 packs/day for 25 years    Types: Cigarettes    Quit date: 08/26/1992  . Smokeless tobacco: Never Used  . Alcohol Use: No     Comment: 11/22/11 "last alcoholic drink was years ago"   OB History   Grav Para Term Preterm Abortions TAB SAB Ect Mult Living                 Review of Systems  Constitutional: Negative for fever and chills.  HENT: Negative.  Negative for neck pain.   Respiratory: Positive for shortness of breath. Negative for cough.   Cardiovascular: Negative.  Negative for chest pain and leg swelling.  Gastrointestinal: Negative.   Musculoskeletal: Negative.   Skin: Negative.   Neurological: Negative.   Psychiatric/Behavioral: Negative for confusion.    Allergies  Aspirin; Penicillins; and Theophylline  Home Medications   Current Outpatient Rx  Name  Route  Sig  Dispense  Refill  . albuterol (PROAIR HFA) 108 (90 BASE) MCG/ACT inhaler   Inhalation   Inhale 2 puffs into the lungs every 6 (six) hours as needed for shortness of breath.          . ALPRAZolam (XANAX) 1 MG tablet   Oral   Take 1 mg by mouth 2 (two) times daily as needed for anxiety.          [name] atenolol (TENORMIN) 50 MG tablet   Oral   Take 50 mg by mouth daily.         [name] b complex vitamins tablet   Oral   Take 1 tablet by mouth daily.          . B-D ULTRAFINE III SHORT PEN 31G X 8 MM MISC      USE AS DIRECTED   100 each   3   . buPROPion (WELLBUTRIN XL) 150 MG 24 hr tablet   Oral   Take 150 mg by mouth daily.         [name] desvenlafaxine (PRISTIQ) 100 MG 24 hr tablet   Oral   Take 100 mg by mouth daily.         [name] dextromethorphan-guaiFENesin (MUCINEX DM) 30-600 MG per 12 hr tablet   Oral   Take 1-2 tablets by mouth every 12 (twelve) hours.           . Fluticasone-Salmeterol (ADVAIR DISKUS) 250-50 MCG/DOSE AEPB   Inhalation   Inhale 1 puff into the lungs 2 (two) times daily.   28 each   0   . FREESTYLE LITE test strip  TEST TWICE A DAY   50 each   6   . furosemide (LASIX) 20 MG tablet   Oral   Take 3 tablets (60 mg total) by mouth daily.   90 tablet   0   . insulin glargine (LANTUS) 100 UNIT/ML injection   Subcutaneous   Inject 90 Units into the skin daily.          . Multiple Vitamin (MULTIVITAMIN WITH MINERALS) TABS   Oral   Take 1 tablet by mouth daily. Centrum Silver         . Multiple Vitamins-Minerals (CENTRUM SILVER PO)   Oral   Take by mouth daily.         . pantoprazole (PROTONIX) 40 MG tablet   Oral   Take 40 mg by mouth daily.         . polyethylene glycol (MIRALAX / GLYCOLAX) packet   Oral   Take 17 g by mouth daily as needed (constipation).         . potassium chloride SA (K-DUR,KLOR-CON) 10 MEQ tablet   Oral   Take 1 tablet (10 mEq total) by mouth every other day.   30 tablet   1   . promethazine (PHENERGAN) 25 MG tablet   Oral   Take 25 mg by mouth 2 (two) times daily as needed for nausea.         [name] QUEtiapine (SEROQUEL) 100 MG tablet   Oral   Take 400 mg by mouth at bedtime.          [name] rOPINIRole (REQUIP) 4 MG tablet   Oral   Take 4 mg by mouth at bedtime.         . Saline (OCEAN NASAL SPRAY NA)   Nasal   Place 1 spray into the nose daily as needed (congestion).         . Simethicone (GAS-X PO)   Oral   Take 1 tablet by mouth daily. Takes after the biggest meal of the day         . simvastatin (ZOCOR) 40 MG tablet   Oral   Take 40 mg by mouth at bedtime.         . Vitamin D, Ergocalciferol, (DRISDOL) 50000 UNITS CAPS      TAKE 1 CAPSULE (50,000 UNITS TOTAL) BY MOUTH EVERY 7 (SEVEN) DAYS. TAKE ON FRIDAY   5 capsule   8   . lisinopril (PRINIVIL,ZESTRIL) 5 MG tablet   Oral   Take 1 tablet (5 mg total) by mouth daily.   30 tablet   0     BP 134/65  Pulse 65  Temp(Src) 98.7 F (37.1 C) (Oral)  Resp 16  Ht 5\' 4"  (1.626 m)  Wt 209 lb (94.802 kg)  BMI 35.86 kg/m2  SpO2 96% Physical Exam  Constitutional: She is oriented to person, place, and time. She appears well-developed and well-nourished.  HENT:  Head: Normocephalic.  Neck: Normal range of motion. Neck supple.  Cardiovascular: Normal rate and regular rhythm.   Pulmonary/Chest: Effort normal and breath sounds normal.  Abdominal: Soft. Bowel sounds are normal. There is no tenderness. There is no rebound and no guarding.  Musculoskeletal: Normal range of motion. She exhibits no edema.  Neurological: She is alert and oriented to person, place, and time. A cranial nerve deficit is present.  Skin: Skin is warm and dry. No rash noted.  Psychiatric: She has a normal mood and affect.    ED Course   Procedures (  including critical care time)  Labs Reviewed  CBC - Abnormal; Notable for the following:    RBC 3.29 (*)    Hemoglobin 10.1 (*)    HCT 30.4 (*)    RDW 16.5 (*)    Platelets 110 (*)    All other components within normal limits  BASIC METABOLIC PANEL - Abnormal; Notable for the following:    Sodium 134 (*)    Glucose, Bld 414 (*)    BUN 27 (*)    Creatinine, Ser 1.16 (*)    GFR calc non Af Amer 47 (*)    GFR calc Af Amer 54 (*)    All other components within normal limits  PRO B NATRIURETIC PEPTIDE - Abnormal; Notable for the following:    Pro B Natriuretic peptide (BNP) 286.1 (*)    All other components within normal limits  URINALYSIS, ROUTINE W REFLEX MICROSCOPIC - Abnormal; Notable for the following:    Glucose, UA >1000 (*)    Nitrite POSITIVE (*)    All other components within normal limits  URINE MICROSCOPIC-ADD ON - Abnormal; Notable for the following:    Squamous Epithelial / LPF FEW (*)    Bacteria, UA MANY (*)    All other components within normal limits  URINE CULTURE  D-DIMER, QUANTITATIVE  POCT I-STAT TROPONIN I   Dg Chest 2  View  03/22/2013   *RADIOLOGY REPORT*  Clinical Data: Shortness of breath, urinary tract infection  CHEST - 2 VIEW  Comparison: 02/18/2013  Findings: Heart size within normal limits.  Vascular pattern normal.  No evidence of infiltrate or consolidation.  No pleural effusion.  No change from prior study.  IMPRESSION: No acute findings   Original Report Authenticated By: Esperanza Heir, M.D.   No diagnosis found. 1. UTI  MDM  Lab studies show a UTI, and no evidence to suggest recurrent CHF. She is able to ambulate here without desaturation or shortness of breath. Will treat UTI and discharge home. Patient and family made aware of care plan and comfortable with discharge.   Arnoldo Hooker, PA-C 03/22/13 1541

## 2013-03-22 NOTE — ED Notes (Signed)
Pt reports that she has COPD and states that she went to a clinic today and told that she needed to come to the ED for further evaluation. Pt reports that her urine has been cloudy. Pt wears home O2. Denies any chest pain.

## 2013-03-22 NOTE — ED Notes (Signed)
NP Shari at bedside.

## 2013-03-22 NOTE — ED Provider Notes (Signed)
Medical screening examination/treatment/procedure(s) were conducted as a shared visit with non-physician practitioner(s) and myself.  I personally evaluated the patient during the encounter  C/o hyperglycemia to 400s with foul smelling urine. Sent from [facility] for UA. Recent admit for CHF.  Feels breathing at baseline, no increase in home O2. CXR clear, lungs clear. Hyperglycemia without DKA.  Glynn Octave, MD 03/22/13 [phone]

## 2013-03-24 ENCOUNTER — Encounter (HOSPITAL_COMMUNITY)
Admission: RE | Admit: 2013-03-24 | Discharge: 2013-03-24 | Disposition: A | Discharge status: Home / Self Care | Payer: Medicare Other | Source: Ambulatory Visit | Attending: Pulmonary Disease | Admitting: Pulmonary Disease

## 2013-03-24 ENCOUNTER — Other Ambulatory Visit (HOSPITAL_COMMUNITY): Payer: Self-pay | Admitting: Pulmonary Disease

## 2013-03-24 ENCOUNTER — Encounter (HOSPITAL_COMMUNITY): Payer: Self-pay

## 2013-03-24 ENCOUNTER — Telehealth: Payer: Self-pay

## 2013-03-24 ENCOUNTER — Telehealth: Payer: Self-pay | Admitting: Pulmonary Disease

## 2013-03-24 VITALS — BP 134/56 | HR 59 | Temp 97.7°F | Resp 16 | Ht 64.0 in | Wt 209.0 lb

## 2013-03-24 DIAGNOSIS — D649 Anemia, unspecified: Secondary | ICD-10-CM

## 2013-03-24 LAB — URINE CULTURE

## 2013-03-24 MED ORDER — SODIUM CHLORIDE 0.9 % IV SOLN
Freq: Once | INTRAVENOUS | Status: AC
  Administered 2013-03-24: 14:00:00 via INTRAVENOUS

## 2013-03-24 MED ORDER — FERUMOXYTOL INJECTION 510 MG/17 ML
510.0000 mg | Freq: Once | INTRAVENOUS | Status: AC
  Administered 2013-03-24: 510 mg via INTRAVENOUS
  Filled 2013-03-24: qty 17

## 2013-03-24 NOTE — Telephone Encounter (Signed)
Per SN---  Use the mucinex 1-2 bid Delsym otc cough syrup prn  No salt/ keep legs elevated Take her nerve pills as directed.

## 2013-03-24 NOTE — Telephone Encounter (Signed)
French Ana with [facility] advised, states she will call and let us know how patient is doing

## 2013-03-24 NOTE — Telephone Encounter (Signed)
I spoke with Traci. She stated pt called her c/o SOB x Friday. She is on  3 liters sats at 97%, lungs sound clear. [facility] and was sent to the ED since they could not test her there. Pt was sent home w/ ABX and they also did CXR-was clear. Pt cough is just slightly worse. Her weight today was 216 and a couple days ago it was 210. She has some trace of very low swelling in her lower extremities. Pt has pending appt Monday w/ SN 03/29/13. Please advise thanks  Allergies  Allergen Reactions  . Aspirin Other (See Comments)    Causes nosebleeds  . Penicillins Itching  . Theophylline Nausea And Vomiting

## 2013-03-24 NOTE — Telephone Encounter (Signed)
I called and made Traci aware of recs. She voiced her understanding and needed nothing further was needed

## 2013-03-24 NOTE — Telephone Encounter (Signed)
French Ana with [facility] called pt needs pt's cbg this am was 447 pt was seen recently and lantus was increased and Metformin was stopped, please advise French Ana [phone]

## 2013-03-24 NOTE — Telephone Encounter (Signed)
Can increase Lantus to 50 units injected 2x a day, in am and at bedtime, but I am not sure we can do this without  short-acting insulin. Needs a new appt with Dr Everardo All when he comes back from vacation >> discuss short acting insulin vs other meds along with Lantus.

## 2013-03-25 NOTE — ED Notes (Signed)
+   urine Patient treated with Cehalexin-sensitive to same-chart appended per protocol MD.

## 2013-03-29 ENCOUNTER — Ambulatory Visit (INDEPENDENT_AMBULATORY_CARE_PROVIDER_SITE_OTHER): Payer: Medicare Other | Admitting: Pulmonary Disease

## 2013-03-29 ENCOUNTER — Encounter: Payer: Self-pay | Admitting: Pulmonary Disease

## 2013-03-29 ENCOUNTER — Other Ambulatory Visit (INDEPENDENT_AMBULATORY_CARE_PROVIDER_SITE_OTHER): Payer: Medicare Other

## 2013-03-29 VITALS — BP 122/72 | HR 54 | Temp 98.6°F | Ht 64.0 in | Wt 215.4 lb

## 2013-03-29 DIAGNOSIS — E119 Type 2 diabetes mellitus without complications: Secondary | ICD-10-CM

## 2013-03-29 DIAGNOSIS — D509 Iron deficiency anemia, unspecified: Secondary | ICD-10-CM

## 2013-03-29 DIAGNOSIS — I10 Essential (primary) hypertension: Secondary | ICD-10-CM

## 2013-03-29 DIAGNOSIS — B379 Candidiasis, unspecified: Secondary | ICD-10-CM

## 2013-03-29 DIAGNOSIS — E7800 Pure hypercholesterolemia, unspecified: Secondary | ICD-10-CM

## 2013-03-29 DIAGNOSIS — F333 Major depressive disorder, recurrent, severe with psychotic symptoms: Secondary | ICD-10-CM

## 2013-03-29 DIAGNOSIS — I5033 Acute on chronic diastolic (congestive) heart failure: Secondary | ICD-10-CM

## 2013-03-29 DIAGNOSIS — K219 Gastro-esophageal reflux disease without esophagitis: Secondary | ICD-10-CM

## 2013-03-29 DIAGNOSIS — K31819 Angiodysplasia of stomach and duodenum without bleeding: Secondary | ICD-10-CM

## 2013-03-29 LAB — BASIC METABOLIC PANEL
Chloride: 100 mEq/L (ref 96–112)
Creatinine, Ser: 1.4 mg/dL — ABNORMAL HIGH (ref 0.4–1.2)
Potassium: 4.9 mEq/L (ref 3.5–5.1)
Sodium: 135 mEq/L (ref 135–145)

## 2013-03-29 LAB — CBC WITH DIFFERENTIAL/PLATELET
Basophils Relative: 0.3 % (ref 0.0–3.0)
Eosinophils Relative: 3.8 % (ref 0.0–5.0)
MCV: 96.1 fl (ref 78.0–100.0)
Monocytes Absolute: 0.5 10*3/uL (ref 0.1–1.0)
Neutrophils Relative %: 77.4 % — ABNORMAL HIGH (ref 43.0–77.0)
RBC: 2.76 Mil/uL — ABNORMAL LOW (ref 3.87–5.11)
WBC: 5.4 10*3/uL (ref 4.5–10.5)

## 2013-03-29 LAB — IBC PANEL
Saturation Ratios: 35.4 % (ref 20.0–50.0)
Transferrin: 278.8 mg/dL (ref 212.0–360.0)

## 2013-03-29 MED ORDER — FLUCONAZOLE 100 MG PO TABS: ORAL_TABLET | ORAL | Status: DC

## 2013-03-29 NOTE — Progress Notes (Addendum)
Subjective:    Patient ID: [name], female    DOB: 1940-11-26, 72 y.o.   MRN: [id number]  HPI 72 y/o WF here for a follow up visit... she has mult med problems as noted below>   ~  October 25, 2011:  48mo ROV & [name] is w/o complaints today; SEE 06/12/11 OV for prev note string...  We rechecked labs today (see below), no changes made...    COPD> on Home O2, Advair250, NEBS, Mucinex; remote smoker quit yrs ago; known obstructive & restrictive lung dis; she has lost 10# in the interval & feels as though her breathing is sl improved as a result; asked to continue the same meds for now.     HBP> on Aten50, Lasix40Bid, K20; BP= 120/72 & she has mult somatic complaints but no specific CP, palpit, ch in SOB or edema; continue low sodium 7 wt reduction.     CAD & AS/AI> known non-obstructive CAD; hx atypCP/ CWP; no angina & she remains way too sedentary; discussed risk factor reduction strategy & she has f/u Cards DrCooper soon.    Chol> on Simva40; not really on diet & we discussed this; last FLP 10/12 showed TChol 110, TG 116, HDL 34, LDL 53; continue same, needs exercise!    DM> on Lantus20, Metform1000Bid, Glimep4, Januvia100; needs much better diet & exercise program! Not checking BS at home; BS=276, A1c=6.8 (improved from prev) & Pharm indicates that she is filling pretty regularly...    Obesity> way too sedentary, ?on diet & she has finally lost a little wt (down 10# to 215# today); we discussed diet, exercise, wt reduction strategies...    {GERD, Angiodysplasia w/ chronic GI blood loss...    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 We will continue  to check labs every 31mo & ROV in 48mo...    DJD/ FM/ ?osteopenia/ VitD defic> she has mult somatic complaints & takes Tylenol prn; on VitD 50K weekly...    RLS> on Requip4Qhs & Sinemet25/100Bid from DrDohmeier w/ fair control of symptoms...    Anxiety/ Depression/ Psyche> on RX per Psychiatry DrHejazi (we don't have notes from him) & she does not know her meds, didn't bring med bottles or med list!  As best we can tell she is on Nuvigil, Pristiq, Wellbutrin, Risperdal, Seroquel, Xanax> all from Psychiatry & she is reminded to bring all bottles to every visit... LABS 3/13:  Chems- ok x BS=276 A1c=6.8;  CBC- Hg=11.2 Fe=71 WBC=4.2K, Plat=75K...  ~  Jan 13, 2012:  58mo ROV & post hosp visit> Hosp 3/29 - 11/26/11 w/ COPD exac & mult chronic medical issues; treated w/ Pred & Zithromax & improved;  disch on Oxygen 2L/min, Advair250Bid, Pred taper, Proair prn;  Now improved & approaching baseline, she has completed the Pred taper but noted incr SOB, cough, & yellow sputum==> she wants additional antibiotic rx & we wrote for Avelox x7d...  She has mult additional somatic complaints> dizzy, abd discomfort, RLS, etc; overall they note fewer good days and more bad days; he daugh has been in the hosp & it wears her out to go  for a visit...     Hx DM on Lantus plus Metformin500mg - 2Bid, Glimepiride 4mg  Qam, & Januvia 100mg /d> BS=191, A1c=6.9 and she will maintain her current regimen + diet & try to incr exercise...    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    We reviewed her prob list, meds, xrays and labs> see below>>  LABS 5/13:  FLP- at goals on Simva40 x HDL=32;  Chems- ok x BS=191 A1c=6.9 LFTs=sl elev;  CBC- pancytopenic w/ Hg=9.8 Fe=53;  TSH=3.50  ~  February 24, 2012:  6wk ROV & [name] has experienced slowly incr blood sugars requiring incr Lantus  doses- now at 40u daily & BS checks still in the 300s; in addition she is c/o worsening RLS- prev evaluated by DrDohmeier, Neurology & she was on Requip & Sinemet w/ better control but she stopped the Requip on her own some time ago w/ recurrent RLS symptoms...    Breathing is ok; BP controlled on Aten & Lasix; Chol prev ok on Simva40;  She remains on 6 psychotopic meds from Psychiatry, [name] but she never brings bottles & we don't have notes..    We reviewed prob list, meds, xrays and labs> see below>> LABS 7/13:  Chems- ok x BS=339;  A1c=7.4 REC to slowly incr Lantus by 5u increments until FBS <150...  ~  May 26, 2012:  46mo ROV & [name] relates the following issues since last visit>>    She saw DrDohmeier for Neuro for f/u Tremors & she made several adjustments in her Psyche meds- stopped the Risperdal & decr the Seroquel to 100mg /d; pt notes that tremors are better but still signif (also on SinemetCR-Bid) & she claims "withdrawal" from the Risperdal cessation w/ fallx2/ "messed up my liver"/ "sugars went crazy"...  Initiation of Requip really has helped the RLS...    ?Labs by Neuro showed low iron by pt report (we don't have notes from DrDohmeier) & DrDBrodie ordered Iron infusion for pt- done 04/22/12 w/ 1000mg  Iron Dextran infused at day surg; she is due for f/u labs today> Hg=11.1  Fe=76    She is c/o choking while eating/ swallowing & she has f/u appt w/ DrDBrodie to see about repeat EGD w/ dilatation (last EGD 2011 showed normal esoph, GAVE syndrome w/ argon laser ablation)...    DM control "went crazy" she says> she has cut the Lantus to 25u/d, & still taking the Metform500-2Bid, Glim4/d, Januv100/d; BS at home varies betw 68-242 & labs today showed BS=164  A1c= 5.0     We reviewed prob list, meds, xrays and labs> see below for updates >> OK Flu vaccine today... LABS 10/13:  Chems- ok x BS=164 A1c=5.0;  CBC- ok x Hg=11.1 WBC=4.2 Plat=86 Fe=76 (23%)  ~  September 01, 2012:  46mo ROV & [name]  is c/o a URI "cold in my chest" w/ cough, discolored phlegm "diff colors at diff times", chest congestion, etc; she notes- tired all the time, HH is bothering her, BS at home betw 170-200 range... We reviewed the following medical problems during today's office visit>>     COPD> on Home O2, Advair250, NEBS, Mucinex; remote smoker quit yrs ago; known obstructive & restrictive lung dis; she had ZPak recently but persists w/ c/o discolored phlegm, no f/c/s, vague chest discomfort; last CXR 3/13 showed linear scarring left mid lung, otherw clear;  CTA 3/13 was neg for PE & showed some atelec, coronary calcif, multilevel spondylosis, & cirrhosis of the liver; she declined f/u film today- we decided to treat w/ Levaquin, Mucinex, Fluids, Hycodan...    HBP> on Aten50, Lasix40, K20; BP= 118/82 & she has mult somatic complaints but no specific CP, palpit, ch in SOB or edema; continue low sodium & wt reduction.     CAD & mild AS/AI> known non-obstructive CAD; hx atypCP/ CWP; no angina & she remains way too sedentary; discussed risk factor reduction strategy & she saw DrCooper 7/13> stable, no angina, too sedentary; f/u 2DEcho w/ mild LVH, norm LVF w/ EF=55-60% & norm wall motion, mils AS & AI, Gr2DD...    Chol> on Simva40; not really on diet & we discussed this; last FLP 5/13 showed TChol 94, TG 130, HDL 32, LDL 36; continue same, needs exercise!    DM> on Lantus25, Metform1000Bid, Glimep2, [id number]; needs much better diet & exercise program! Not checking BS at home; labs from 10/13 showed BS=164, A1c=5.0 so we decreased the Glimep from 4mg  to 2mg  each AM...    Obesity> way too sedentary, ?on diet & her wt is up to 209# today; we discussed diet, exercise, wt reduction strategies...    << Chronic GI problems followed by DrDBrodie >>    {GERD, Angiodysplasia w/ chronic GI blood loss >> see below... 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 We will continue to check labs every 60mo w/ Iron infusions of Hg is reduced & ROV in 15mo...    DJD/ FM/ ?osteopenia/ VitD defic> she has mult somatic complaints & takes Tylenol prn; on VitD 50K weekly...    RLS> on Requip4Qhs & Sinemet25/100Bid from DrDohmeier w/ fair control of symptoms- her note is reviewed...    Anxiety/ Depression/ Psyche> on RX per Psychiatry DrHejazi (we don't have notes from him) & she does not know her meds, didn't bring med bottles or med list!  As best we can tell she is on Nuvigil, Pristiq, Wellbutrin, Risperdal, Seroquel, Xanax> all from Psychiatry & she is reminded to bring all bottles to every visit... We reviewed prob list, meds, xrays and labs> see below for updates >>  LABS 1/14:  CBC- Hg=7.0 (down 4pts) Hct=21.9 Fe=51 (11%sat) B12=623, Protime=11.6/ 1.1, AFP=pending; Abd sonar per DrDBrodie- pending...  ~  Dec 30, 2012:  15mo ROV & [name] is c/o incr SOB, having to wear her O2 all the time, and can't get a deep breath; she feels weak, states she's having a lot of nausea (wants phenergan refilled); she has had several interval visits w/ [facility] & DrDBrodie for f/u of her Cirrhosis (no ascites on sonar 1/14), watermelon stomach, GAVE syndrome, & chr blood loss anemia; on Protonix40 & had EGD w/ Argon Laser ablation of gastric AVM 1/14, followed by IV Iron therapy for Hg=7; her Hg improved to 9.3 then fell back to 8.8 & remained there (Fe improved from 51 to 177 after the infusion & has settled back to 70 since then)...  DrDBrodie asked that we recheck her from the Pulm standpoint due to her SOB>>     COPD> on Home O2, Advair250, NEBS, Mucinex; remote smoker quit yrs ago; known obstructive & restrictive  lung dis; last CXR 2/14 showed cardiomeg, no pulm edema, clear lungs, NAD; CTA 3/13 was neg for PE & showed some atelec, coronary  calcif, multilevel spondylosis, & cirrhosis of the liver; she saw TP 3/14 w/ incr cough, thick yellow sput, congestion & post nasal drip> took Avelox & Mucinex w/ some improvement;  We decided to evaluate further>>  PFT today showed FVC=2.41 (83%), FEV1=1.77 (80%), %1sec=74, mid-flows= 61% predicted (all c/w min obstructive dis, +superimposed restriction)...  Ambulatory O2 sats showed 93% sat on RA at rest w/ HR=60; 87% sat after one lap on RA w/ HR=62... Other factors include 12# weight gain, weight =221#, very sedentary lifestyle, anemia (she did not want me to recheck this as it is being followed by DrDBrodie), and severe anxiety...  She thinks a lot of her SOB is from CHF siting her wt gain etc but a recent BNP 3/14 was ok at 180, she has a follow up appt w/ DrCooper soon...  I told her that in my opinion the biggest current issue (and most improvable factor) is anxiety & "chest wall muscle spasm" making her tight & hard to get a good breath "IN" as she described it; I went through my long explanation for this problems & told her that the BEST therapy for this was a "combination relaxer" like KLONOPIN & rec that she try 1mg - 1/2 to 1 tab bid... All questions answered & she is agreeable to this plan... ADDENDUM >> pt called back & indicated that after reading the Pharm handout that she decided NOT to take the Klonopin, doesn't want a substitute...  ~  February 25, 2013:  71mo ROV & post hosp check> since I last saw [name] 71mo ago- she has been Adm 3 times by [facility]>>   Adm 5/25-27 for weakness assoc w/ IDA & GIB> Hx cirrhosis, angiodysplasia, and had EGD showing sm varicies, portal gastropathy, mult AVMs w/ laser ablation; Hg was 7.7=> 2uPCs & improved to 9 range; subseq capsule endoscopy showed AVMs in upper sm intestines as well... Adm 6/9-12 for N&V assoc w/ constip & UTI (sens EColi- treated w/ Cipro) and underlying cirrhosis, portal gastropathy, angiodysplasia, etc (treated w/ PPI & monitoring of Hg & Fe-  given 2uPRBCs & Fe infusions as outpt)...  Adm 6/24-27 for DiastolicCHF (CXR- vasc congestion; 2DEcho- EF=55-60%, Gr2DD, mildAS/AI; Lasix added)...    COPD> on Home O2, Advair250, NEBS, Mucinex; remote smoker quit yrs ago; known obstructive & restrictive lung dis; CXR 6/14 showed mild cardiomeg, mild vasc congestion, linear atx at left base, otherw clear; CTA 3/13 was neg for PE & showed some atelec, coronary calcif, multilevel spondylosis, & cirrhosis of the liver; she continues on O2 by [place] at 3L/min...     HBP> on Aten50, Lisinopril5, Lasix20-3/d, K10Qod; BP= 130/70 & she has mult somatic complaints but no specific CP, palpit, ch in SOB or edema; continue low sodium & wt reduction.     CAD, DiastolicCHF, & mild AS/AI> known non-obstructive CAD; hx atypCP/ CWP; no angina & she remains way too sedentary; discussed risk factor reduction strategy & she saw DrCooper 5/14> stable, no angina, too sedentary; last 2DEcho 7/13 w/ mild LVH, norm LVF w/ EF=55-60% & norm wall motion, mild AS&AI, Gr2DD;  EKG 5/14 w/ NSR, rate62, 1st degree AVB, NSSTTWA...    Chol> on Simva40; not really on diet & we discussed this; last FLP 5/13 showed TChol 94, TG 130, HDL 32, LDL 36; continue same, needs exercise!    DM> on Lantus20u &  off all DM pills per DrEllison; needs much better diet & exercise program! Not checking BS at home; labs from 6/14 showed BS=169, A1c=6.7 & she will continue f/u w/ Endocrinology...    Obesity> way too sedentary, ?on diet & her wt is 218# today; we discussed diet, exercise, wt reduction strategies...    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    DJD/ FM/ ?osteopenia/ VitD defic> she has mult somatic complaints & takes Tylenol prn; on MVI + VitD 50K weekly...    RLS> on Requip4Qhs & Sinemet25/100Bid from DrDohmeier w/ fair control of symptoms- her note is reviewed...    Anxiety/ Depression/ PsycheHx> on RX per Psychiatry DrHejazi (we don't have notes from him) & she does not know her meds, didn't bring med bottles or med list!  As best we can tell she is on Nuvigil (on hold), Pristiq100, WellbutrinXL150, Seroquel100-4Qhs, Xanax1mg  prn- all from Psychiatry & she is reminded to bring all bottles to every visit... We reviewed prob list, meds, xrays and labs> see below for updates >>  LABS 7/14:  CBC- Hg=8.8, MCV=93, WBC=3.2, Plat=74K;  Fe=58 (14%sat), Ferritin=12.5.Marland KitchenMarland Kitchen NOTE>> Fe is dropping & we decided to infuse [facility] 510mg  x2 as outpt... ROV 87mo.  ~  March 29, 2013:  87mo ROV & [name] received her IV Feraheme 510mg  x2 since our last visit; still c/o no energy & SOB; she went to the ER 1wk ago w/ UTI- given Cipro but cult returned EColi resist Cipro & switched to Keflex (sens);  Due for f/u labs today>> We reviewed prob list, meds, xrays and labs> see below for updates >>  CXR 7/14 showed norm heart size, clear lungs, no edema etc... LABS 8/14:  Chems- ok x BS=257 Cr=1.4;  CBC- Hg=8.8 Fe=138 (35%sat)...  Rec>> needs f/u appt w/ DrEllison for endocrine & plan f/u labs monthly...           Problem List:  COPD (ICD-496) & BRONCHITIS, RECURRENT (ICD-491.9) - Ex-smoker w/ underlying COPD on home O2, ADVAIR 250Bid, ALBUTEROL (via nebs or MDI for Prn use), & MUCINEX 1-2Bid... she has combined obstructive AND restrictive disease (based on her obesity) w/ diet (weight reduction) + exercise strongly recommended to the pt...  ~  CTChest 5/06 = neg. ~  baseline CXR w/ bilat LL scarring, NAD.... last CXR 3/09 = unchanged, NAD. ~  PFTs 3/09 showed FVC= 2.47 (83%), FEV1= 1.65 (69%), %1sec= 67, mid-flows= 28%pred. ~  CXR 4/12 showed  bilat scarring, NAD... ~  PFT 10/12 showed FVC= 2.28 (77%), FEV1= 1.46 (64%), %1sec= 64, mid-flows= 31% pred. ~  CXR 3/13 showed normal heart size, prom hilar regions, no infiltrates etc, NAD.[name]. ~  CTAngio Chest 3/13 showed neg for PE- scarring at lung bases, no adenopathy or lung lesions, calcified coronaries, multilevel spondylosis in TSpine, cirrhosis in liver ~  CXR 2/14 showed cardiomeg, no pulm edema, clear lungs, NAD ~  PFT 5/14 showed FVC=2.41 (83%), FEV1=1.77 (80%), %1sec=74, mid-flows= 61% predicted (all c/w min obstructive dis, +superimposed restriction) ~  Ambulatory O2 sats 5/14 showed 93% sat on  RA at rest w/ HR=60; 87% sat after one lap on RA w/ HR=62 ~  7/14: on Home O2, Advair250, NEBS, Mucinex; remote smoker quit yrs ago; known obstructive & restrictive lung dis; CXR 6/14 showed mild cardiomeg, mild vasc congestion, linear atx at left base, otherw clear; CTA 3/13 was neg for PE & showed some atelec, coronary calcif, multilevel spondylosis, & cirrhosis of the liver; she continues on O2 by [place] at 3L/min...  ~  CXR 7/14 showed norm heart size, clear lungs, no edema etc..   HYPERTENSION (ICD-401.9) >>  ~  on ATENOLOL 50mg /d,  LASIX 40mg /d & K20/d...  ~  10/11:  BP=116/78 and she has mult somatic complaints but all are diminished compared to prev visits; denies bad HA's, CP, palpit, syncope, edema, etc... ~  4/12:  BP= 140/60 and she persists w/ mult somatic complaints... ~  10/12:  BP= 122/74 & she has mult somatic complaints; needs better low sodium, calorie restricted diet & wt reduction... ~  3/13:  BP= 120/72 & she is unchanged symptomatically;  EKG 3/13 showed NSR, rate68, LAD, late transition, NSSTTWA... ~  7/13:  BP= 110/62 & she persists w/ mult somatic complaints... ~  10/13:  BP= 118/60 & she remains clinically stable... ~  7/14: on Aten50, Lisinopril5, Lasix20-3/d, K10Qod; BP= 130/70 & she has mult somatic complaints but no specific CP, palpit, ch in SOB or edema; continue  low sodium & wt reduction. ~  7/14: DrEllison stopped her Aten50; f/u BP on Lisin5, Lasix60, & K10Qod = 122/72...  CAD (ICD-414.00) - H[facility] 4/09 w/ CP... cath showed non-obstructive CAD... no CP, palpit, etc (but she is way too sedentary). ~  cath 4/09 by DrCooper showed normLMAIN, 20% midLAD, norm CIRC, 20-30% proxRCA, EF= 50-55%... ~  EKG 1/11 showed SBrady, rate54, ?septal infarct, NSSTTWA, NAD... ~  EKG 3/13 showed NSR, rate68, LAD, late transition, NSSTTWA... ~  CTAngio Chest 3/13 showed calcif coronaries as noted... ~  DrCooper 7/13> stable, no angina, too sedentary; f/u 2DEcho w/ mild LVH, norm LVF w/ EF=55-60% & norm wall motion, mils AS & AI, Gr2DD.Marland KitchenMarland Kitchen  PALPITATIONS (ICD-785.1) >>  DIASTOLIC CHF >>  ~  eval by DrKlein w/ 7 beats WT on monitor... he changed Lisinopril to BBlocker (currently ATENOLOL 50mg /d) and improved... ~  she saw DrCopper for Cards f/u 1/11- palpit well controlled on Atenolol; hx non-obstructive CAD & good LVF; no change in Rx. ~  [facility] 6/14 w/ Diastolic CHF and Lasix adjusted- Disch on Lasix20-3/d  AORTIC STENOSIS (ICD-424.1) - she has mild Ao Valve disease w/ AS/ AI... followed by DrCooper. ~  2DEcho 11/06 showed mild ca++ AoV w/ mild AS, norm LVF w/ EF=55-65% ~  repeat 2DEcho 1/09 showed similar mild ca++ AoV w/ mild AS, mild AI, no regional wall motion abn, norm LVf w/ EF= 60%... NOTE: est PAsys= 35... ~  repeat 2DEcho 1/10 showed norm LVF w/ EF= 55-60%, no regional wall motion abn, mild DD, mod calcif AoV & mild reduced leaflet excursion & mild AI... ~  Repeat 2DEcho 7/13 showed norm LVF w/ EF=55-65%, no regional wall motion abn, Gr2DD, mildAS, mildAI, mild LAdil... ~  DrCooper 7/13> stable, no angina, too sedentary; f/u 2DEcho w/ mild LVH, norm LVF w/ EF=55-60% & norm wall motion, mils AS & AI, Gr2DD... ~  7/14: known non-obstructive CAD; hx atypCP/ CWP; no angina & she remains way too sedentary; discussed risk factor reduction strategy & she saw  DrCooper 5/14> stable, no angina, too sedentary; last  2DEcho 7/13 w/ mild LVH, norm LVF w/ EF=55-60% & norm wall motion, mild AS&AI, Gr2DD;  EKG 5/14 w/ NSR, rate62, 1st degree AVB, NSSTTWA...  HYPERCHOLESTEROLEMIA (ICD-272.0) - on SIMVASTATIN 40mg /d... ~  FLP 6/09 showed TChol 107, TG 69, HDL 35, LDL 59 ~  FLP 3/10 showed TChol 128, TG 125, HDL 33, LDL 70 ~  FLP 12/10 showed TChol 98, TG 64, HDL 37, LDL 48... continue same. ~  FLP 10/11 showed TChol 124, TG 105, HDL 37, LDL 66 ~  FLP 4/12 showed TChol 122, TG 95, HDL 37, LDL 66 ~  FLP 10/12 on Simva40 showed TChol 110, TG 116, HDL 34, LDL 53 ~  FLP 5/13 on Simva40 showed TChol 94, TG 130, HDL 32, LDL 36  DM (ICD-250.00) - hx irreg control, not really on diet... on LANTUS per drEllison,  & odd METFORMIN, GLIMEPIRIDE, JANUVIA...  ~  labs 10/08 BS=198, HgA1C=7.2 ~  labs 1/09 showed BS=119, HgA1c=6.0 ~  labs 6/09 showed BS= 248, HgA1c= 7.0..[name] discussed need for starting insulin if she can't get on track and get wt down... ~  labs 9/09 showed BS= 236, HgA1c= 7.6.Marland KitchenMarland Kitchen rec- start Lantus 5u/d... grad increased to 10u. ~  labs 11/09 showed BS= 166, HgA1c= 6.0.Marland KitchenMarland Kitchen rec- great! keep same. ~  labs 3/10 showed BS= 169, A1c= 6.8 ~  labs 12/10 showed BS= 149, A1c= 5.8, Umicroalb= neg. ~  labs 10/11 showed BS= 215, A1c= 7.1.[name]. rec> better diet, get wt down. ~  Labs 4/12 showed BS= 172, A1c=5.8 ~  Labs 10/12 showed BS= 245, A1c= 8.1.Marland KitchenMarland Kitchen rec to take meds every day; incr Lantus dose 10==> 20u/d... ~  Labs 3/13 on Lantus20+54meds showed BS= 276, A1c= 6.8 (improved)... ~  4/13:  No diabetic retinopathy per optometrist at [facility]... ~  Labs 5/13 on Lantus20+21meds showed BS= 191, A1c= 6.9 ==> subsequently BS incr & Lantus incr ~  Labs 7/13 on Lantus40+32meds showed BS= 339, A1c= 7.4.Marland KitchenMarland Kitchen ?why control is off, rec move shots around & incr by 5u/wk til BS<200. ~  Labs 10/13 on Lantus25+70meds showed BS= 164, A1c= 5.0.Marland KitchenMarland Kitchen Too tight & rec to decr Lantus20 & decr  Glimep4mg  to 1/2 tab... ~  She has been adjusting her meds on her own in the interval => referred to DrEllison, Endocrinology... ~  7/14: on Lantus20u & off all DM pills per DrEllison; needs much better diet & exercise program! Not checking BS at home; labs from 6/14 showed BS=169, A1c=6.7 & she will continue f/u w/ Endocrinology. ~  7/14 f/u DrEllison & stopped all DM pills and increased her insulin to Lantus50Bid  MORBID OBESITY (ICD-278.01) - not really on diet or exercise program... ~  weight 6/09 = 223# ~  weight 11/09 = 228# ~  weight 3/10 = 225# ~  weight 6/10 = 217# ~  weight 9/10 = 213# ~  weight 12/10 = 224# ~  weight 2/11 = 216# ~  weight 10/11 = 230# ~  Weight 4/12 = 222# ~  Weight 10/12 = 225# ~  Weight 3/13 = 215#...  great job, keep it up... 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  ~  EGD  2/06 showed angiodysplasia & mult gastric pseudopolyps, watermelon stomach improved from 2003. ~  recent f/u EGD 9/09 showed chr gastritis, no watermelon stomach & improved from prev... ~  recurrent anemia 12/10 w/ f/u EGD 2/11 showing gastric antral avm's- s/p argon laser ablation, no varicies etc... 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 ~  5/13:  She is not on PPI and c/o some abd discomfort- rec starting PROTONIX 40mg /d... ~  She saw DrDBrodie 10/13 w/ c/o dysphagia + f/u of her autoimmune liver dis, portal HTN, cirrhosis, "watermelon stomach" & hx argon laser ablation of gastric AVMs  (chr GI blood loss w/ periodic Fe infusions- last 8/13); EGD 10/13 showed norm esoph mucosa, no varicies, spasm at the LES but no stricture found; dilated to 24F; mod severe portal hypertensive gastropathy in stomach, no active bleeding; REC> continue PPI, try Levsin... ~  1/14:  She had EGD & Argon Laser ablation of gastric AVM by DrDBrodie... 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  DIVERTICULOSIS OF COLON (ICD-562.10) & POLYP, COLON (ICD-211.3) -  ~  last colonoscopy 10/08 w/ adenomatous polyp removed, and angiodysplasia without hemorrhage ~  CT Abd 10/08 showed Cirrhosis, borderline splenomeg, sl pancreatic atrophy & duct dil, sm umbil hernia, DJD sp, NAD... ~  10/13: she had f/u drDBrodie> last colonoscopy was 2008, she felt pt was too high risk for prep & sedation for a f/u colonoscopy & they decided to hold off...  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 ~  10/11:  presents w/ dysuria & UTI Rx w/ Cipro... ~  12/11:  She had f/u DrGrapey... ~  6/14: she was treated for a sens EColi UTI during 6/14 Hosp w/ Cipro...  OSTEOARTHRITIS (ICD-715.90)  FIBROMYALGIA (ICD-729.1) - she persists w/ mult somatic complaints...  ? of OSTEOPOROSIS (ICD-733.00) - she had normal BMD in 2001 at [place]..  RESTLESS LEGS SYNDROME (ICD-333.94) - she prev weaned off the Requip & remains on  SINEMET-CR 25-100Bid... ~  9/10: now c/o recurrent restless leg symptoms... restart REQUIP 2mg  & titrate up. ~  12/10: persistant symptoms on 2mg Qhs... rec to incr to 4mg ... ~  2/11: she reports resolution of RLS on 4mg  Requip Qhs, resting well now. ~  12/11:  Recurrent RLS symptoms despite the Requip & seen by DrDohmeier w/ addition of Sinemet 25/100 Bid... ~  2012:  Stable on Requip4mg  & Sinemet 25/100 Bid... ~  7/13:  She is once again c/o incr RLS symptoms & rec to restart REQUIP 4mg /d...  ~  8/13: she had f/u visit w/ DrDohmeier> RLS, tremors, & insomnia; Seroquel helped for awhile, she is working w/ [employer]  to help her problems... ~  7/14: on Requip4Qhs & Sinemet25/100Bid from DrDohmeier (RLS, Trmeor, Gait abn) w/ fair control of symptoms- her note is reviewed & they stopped the Sinemet...  DYSTHYMIA (ICD-300.4) - Hx depression w/ psychotic features and hosp 2/09 at [facility] w/ delusions of pedunculosis> she was prev treated by Dr. Bartholomew Crews & DrSuttenfield for counselling...  Adm to [facility] in May09 by DrBarbSmith w/ depression and meds adjusted (see DC summary)...  Now followed by [name] and [name] at [facility]- she was re-admitted 10/09 and meds changed to RISPERDAL, PRISTIQUE, BUPROPION, SEROQUEL, XANAX & Nuvigil (Armodafinil)...  husb indicates that she is much better at present & continues outpt Rx... ~  MRI/ MRA Brain 2/08 showed sm vessel dis & atherosclerotic changes in post circ... ~  9/10: pt indicates that she is off the Trazodone... DrHejazi added Wellbutrin ?for her RLS? ~  2011:  pt continues regular f/u w/ Psychiatry on numerous meds w/ freq med adjustments; she is requested to get notes from [facility] to our attention & bring all med bottles to her OVs for review... ~  2012:  We have not received any notes from Psyche; pt has not complied w/ our request for med bottles to be brought to each OV... ~  2013:  We still don't have accurate  list of her current meds as we have not received [facility] notes & she NEVER brings meds to office despite our requests... ~  7/14: on RX per Psychiatry DrHejazi (we don't have notes from him) & she does not know her meds, didn't bring med bottles or med list!  As best we can tell she is on Nuvigil (on hold), Pristiq100, WellbutrinXL150, Seroquel100-4Qhs, Xanax1mg  prn- all from Psychiatry & she is reminded to bring all bottles to every visit...   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 ~  labs 04/18/08 by DrBrodie showed Hg= 10.4, Fe= 52... she ordered 1,200mg  IV Iron- given 04/26/08. ~  labs 05/19/08 showed Hg= 11.3, Fe= 117... ~  labs 11/09 showed Hg= 12.0.Marland KitchenMarland Kitchen ~  labs 3/10 showed Hg= 12.3, Fe= 79 ~  labs 6/10 showed Hg= 11.5, Fe= 73 ~  labs 12/10 showed Hg= 7.2, Fe= 28 (6%sat)... given [facility] & IV Fe per DrBrodie in Jan2012. ~  labs 2/11 showed Hg= 11.8, Fe= 95... pt indicates that DrBrodie will be following labs going forward... ~  labs 6/11 showed Hg= 11.2 ~  labs 9/11 showed Hg= 11.1, Fe= 106 ~  Labs 4/12 showed Hg=11.0, Fe=118 ~  Labs 10/12 showed Hg=9.5, Fe=35 (7%sat)... We will arrange for outpt infusion 1000mg  Fe... ~  Labs 12/12 showed Hg=11.4, Fe=92 (26%sat) ~  Labs 3/13 showed Hg=11.2, Fe=71 (17%sat) ~  Labs 5/13 showed Hg=9.8, Fe=53 (13%sat) ~  04/22/12:  She was given 1000mg  IV Iron Dextran by DrDBrodie... ~  Labs 10/13 showed Hg=11.1, Fe=76 (23%) ~  Labs 1/14 showed Hg=7.0 (down 4pts) Hct=21.9 Fe=51 (11%sat) B12=623, Protime=11.6/ 1.1, AFP=pending; Abd sonar per DrDBrodie- cirrhosis, no ascites. ~  Given IV Iron by DrDBrodie w/ Hg improved to 9.3 & Fe up to 177... ~  Subsequently her Hg settled back to 8.8 & Iron dropped to 70... The pt did not want me to recheck her  labs- "DrBrodie does it" ~  [facility] 6/14 w/ further GIB and had  EGD, Capsule Endo, etc- see above...  We are back to checking CBC, Fe monthly to check for needed iron infusions... ~  Labs 7/14 showed Hg= 8.8 and Fe= 58 (14%) so we decided to proceed w/ IV Fe Feraheme 510mg  x2 infusions... ~  Labs 8/14 s/p IV Feraheme x2 showed Hg= 8.8, Fe= 138 (35%sat); Rec to recheck labs Qmo...   Past Surgical History  Procedure Laterality Date  . Hemorroidectomy    . Appendectomy    . Vaginal hysterectomy    . Breast biopsy      bilateral  . Cesarean section      x 3  . Appendectomy    . Esophagogastroduodenoscopy  06/12/2012    Procedure: ESOPHAGOGASTRODUODENOSCOPY (EGD);  Surgeon: Hart Carwin, MD;  Location: [facility];  Service: Endoscopy;  Laterality: N/A;  . Balloon dilation  06/12/2012    Procedure: BALLOON DILATION;  Surgeon: Hart Carwin, MD;  Location: [facility] ENDOSCOPY;  Service: Endoscopy;  Laterality: N/A;  ?balloon  . Esophagogastroduodenoscopy  09/10/2012    Procedure: ESOPHAGOGASTRODUODENOSCOPY (EGD);  Surgeon: Hart Carwin, MD;  Location: [facility];  Service: Endoscopy;  Laterality: N/A;  . Hot hemostasis  09/10/2012    Procedure: HOT HEMOSTASIS (ARGON PLASMA COAGULATION/BICAP);  Surgeon: Hart Carwin, MD;  Location: [facility];  Service: Endoscopy;  Laterality: N/A;  . Esophagogastroduodenoscopy N/A 01/18/2013    Procedure: ESOPHAGOGASTRODUODENOSCOPY (EGD);  Surgeon: Hart Carwin, MD;  Location: [facility] ENDOSCOPY;  Service: Endoscopy;  Laterality: N/A;    Outpatient Encounter Prescriptions as of 03/29/2013  Medication Sig Dispense Refill  . albuterol (PROAIR HFA) 108 (90 BASE) MCG/ACT inhaler Inhale 2 puffs into the lungs every 6 (six) hours as needed for shortness of breath.       . ALPRAZolam (XANAX) 1 MG tablet Take 1 mg by mouth 2 (two) times daily as needed for anxiety.       [name] atenolol (TENORMIN) 50 MG tablet Take 50 mg by mouth daily.      [name] b complex vitamins tablet Take 1  tablet by mouth daily.       . B-D ULTRAFINE III SHORT PEN 31G X 8 MM MISC USE AS DIRECTED  100 each  3  . buPROPion (WELLBUTRIN XL) 150 MG 24 hr tablet Take 150 mg by mouth daily.      . cephALEXin (KEFLEX) 500 MG capsule Take 1 capsule (500 mg total) by mouth 4 (four) times daily.  20 capsule  0  . desvenlafaxine (PRISTIQ) 100 MG 24 hr tablet Take 100 mg by mouth daily.      [name] dextromethorphan-guaiFENesin (MUCINEX DM) 30-600 MG per 12 hr tablet Take 1-2 tablets by mouth every 12 (twelve) hours.       . Fluticasone-Salmeterol (ADVAIR DISKUS) 250-50 MCG/DOSE AEPB Inhale 1 puff into the lungs 2 (two) times daily.  28 each  0  . FREESTYLE LITE test strip TEST TWICE A DAY  50 each  6  . furosemide (LASIX) 20 MG tablet Take 3 tablets (60 mg total) by mouth daily.  90 tablet  0  . insulin glargine (LANTUS) 100 UNIT/ML injection Inject 50 Units into the skin 2 (two) times daily.       [name] lisinopril (PRINIVIL,ZESTRIL) 5 MG tablet TAKE 1 TABLET (5 MG TOTAL) BY MOUTH DAILY.  30 tablet  6  . Multiple Vitamins-Minerals (CENTRUM SILVER PO) Take by mouth daily.      . pantoprazole (PROTONIX) 40 MG tablet Take 40 mg by mouth daily.      [name]  polyethylene glycol (MIRALAX / GLYCOLAX) packet Take 17 g by mouth daily as needed (constipation).      . potassium chloride SA (K-DUR,KLOR-CON) 10 MEQ tablet Take 1 tablet (10 mEq total) by mouth every other day.  30 tablet  1  . promethazine (PHENERGAN) 25 MG tablet Take 25 mg by mouth 2 (two) times daily as needed for nausea.      [name] QUEtiapine (SEROQUEL) 100 MG tablet Take 400 mg by mouth at bedtime.       [name] rOPINIRole (REQUIP) 4 MG tablet Take 4 mg by mouth at bedtime.      . Saline (OCEAN NASAL SPRAY NA) Place 1 spray into the nose daily as needed (congestion).      . Simethicone (GAS-X PO) Take 1 tablet by mouth daily. Takes after the biggest meal of the day      . simvastatin (ZOCOR) 40 MG tablet Take 40 mg by mouth at bedtime.      . Vitamin D, Ergocalciferol,  (DRISDOL) 50000 UNITS CAPS TAKE 1 CAPSULE (50,000 UNITS TOTAL) BY MOUTH EVERY 7 (SEVEN) DAYS. TAKE ON FRIDAY  5 capsule  8  . [DISCONTINUED] ciprofloxacin (CIPRO) 500 MG tablet Take 500 mg by mouth QID.      . [DISCONTINUED] Multiple Vitamin (MULTIVITAMIN WITH MINERALS) TABS Take 1 tablet by mouth daily. Centrum Silver       No facility-administered encounter medications on file as of 03/29/2013.    Allergies  Allergen Reactions  . Aspirin Other (See Comments)    Causes nosebleeds  . Penicillins Itching  . Theophylline Nausea And Vomiting    Current Medications, Allergies, Past Medical History, Past Surgical History, Family History, and Social History were reviewed in [employer] record.    Review of Systems         See HPI - all other systems neg except as noted...      The patient complains of weight gain, dyspnea on exertion, muscle weakness, difficulty walking, and depression.  The patient denies anorexia, fever, weight loss, vision loss, decreased hearing, hoarseness, chest pain, syncope, peripheral edema, prolonged cough, headaches, hemoptysis, abdominal pain, melena, hematochezia, severe indigestion/heartburn, hematuria, incontinence, suspicious skin lesions, transient blindness, unusual weight change, abnormal bleeding, enlarged lymph nodes, and angioedema.     Objective:   Physical Exam     WD, Obese, 72 y/o WF in NAD... she is chr ill appearing... GENERAL:  Alert & oriented x 3... HEENT:  [place]/AT, pale conjuct, EOM-wnl, PERRLA, EACs-clear, TMs-wnl, NOSE-clear, THROAT-clear w/ dry MMs... NECK:  Supple w/ fairROM; no JVD; normal carotid impulses w/o bruits; no thyromegaly or nodules palpated; no lymphadenopathy. CHEST:  Clear to P & A; without wheezes/ rales/ or rhonchi heard... HEART:  Regular Rhythm;  gr 1/6 SEM without rubs or gallops... ABDOMEN:  Obese, soft & nontender w/ panniculus; normal bowel sounds; no organomegaly or masses detected. EXT:  without deformities, mod arthritic changes; no varicose veins/ +venous insuffic/ tr edema. NEURO:  CN's intact;  no focal neuro deficits... DERM:  No lesions noted; no rash, pale complexion.  RADIOLOGY DATA:  Reviewed in the EPIC EMR & discussed w/ the patient...  LABORATORY DATA:  Reviewed in the EPIC EMR & discussed w/ the patient...   Assessment & Plan:    DYSPNEA> clearly this is multifactorial w/ components from Lung dis, CHF, Obesity, inactivity, Anemia, etc; a large component is likely related to anxiety & chest "tightness" for which she has Xanax 1mg  from her psychiatrists...  COPD>  Combined mild obstructive & mod restrictive lung dis; PFT showed sl deterioration in FEV1; multifactorial dyspnea including stress; REC> continue Advair, NEBS, O2, Mucinex, etc; get on diet/ exercise/ get weight down; continue [facility]yche f/u for help w/ anxiety...  HBP>  Controlled on low dose BBlocker & ACE, plus diuretic> continue same & work on weight reduction...  CAD, DiastolicCHF>  Known nonobstructive dis, way too sedentary> we discussed diet + exercise...  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  CHOL>  Stable on the Simva40...  DM>  Managed by DrEllison on Lantus20u currently...  OBESITY>  Reviewed diet + exercise needed...  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  Autoimmune hepatitis>  Also managed by DrBrodie, off the Immuran at present... She also has pancytopenia related to her cirrhosis.  RLS>  Notes from DrDohmeier reviewed> on Sinemet + needs to restart Requip...  Dysthymia>  followed by Psyche on mult meds...  Anemia>  We will place her back on our monthly protocol of CBC & Iron checks...   Patient's Medications  New Prescriptions   FLUCONAZOLE (DIFLUCAN) 100 MG TABLET    Take 2 tablets today then 1 daily until gone   INSULIN GLARGINE (LANTUS  SOLOSTAR) 100 UNIT/ML SOPN    Inject 120 Units into the skin daily. And pen needles 1/day  Previous Medications   ALBUTEROL (PROAIR HFA) 108 (90 BASE) MCG/ACT INHALER    Inhale 2 puffs into the lungs every 6 (six) hours as needed for shortness of breath.    ALPRAZOLAM (XANAX) 1 MG TABLET    Take 1 mg by mouth 2 (two) times daily as needed for anxiety.    ATENOLOL (TENORMIN) 50 MG TABLET    Take 50 mg by mouth daily.   B COMPLEX VITAMINS TABLET    Take 1 tablet by mouth daily.    B-D ULTRAFINE III SHORT PEN 31G X 8 MM MISC    USE AS DIRECTED   BUPROPION (WELLBUTRIN XL) 150 MG 24 HR TABLET    Take 150 mg by mouth daily.   CEPHALEXIN (KEFLEX) 500 MG CAPSULE    Take 1 capsule (500 mg total) by mouth 4 (four) times daily.   DESVENLAFAXINE (PRISTIQ) 100 MG 24 HR TABLET    Take 100 mg by mouth daily.   DEXTROMETHORPHAN-GUAIFENESIN (MUCINEX DM) 30-600 MG PER 12 HR TABLET    Take 1-2 tablets by mouth every 12 (twelve) hours.    FLUTICASONE-SALMETEROL (ADVAIR DISKUS) 250-50 MCG/DOSE AEPB    Inhale 1 puff into the lungs 2 (two) times daily.   FREESTYLE LITE TEST STRIP    TEST TWICE A DAY   FUROSEMIDE (LASIX) 20 MG TABLET    Take 3 tablets (60 mg total) by mouth daily.   LISINOPRIL (PRINIVIL,ZESTRIL) 5 MG TABLET    TAKE 1 TABLET (5 MG TOTAL) BY MOUTH DAILY.   MULTIPLE VITAMINS-MINERALS (CENTRUM SILVER PO)    Take by mouth daily.   PANTOPRAZOLE (PROTONIX) 40 MG TABLET    Take 40 mg by mouth daily.   POLYETHYLENE GLYCOL (MIRALAX / GLYCOLAX) PACKET    Take 17 g by mouth daily as needed (constipation).   POTASSIUM CHLORIDE SA (K-DUR,KLOR-CON) 10 MEQ TABLET    Take 1 tablet (10 mEq total) by mouth every other day.   PROMETHAZINE (PHENERGAN) 25 MG TABLET    Take 25 mg by mouth 2 (two) times daily as  needed for nausea.   QUETIAPINE (SEROQUEL) 100 MG TABLET    Take 400 mg by mouth at bedtime.    ROPINIROLE (REQUIP) 4 MG TABLET    Take 4 mg by mouth at bedtime.   SALINE (OCEAN NASAL SPRAY NA)    Place 1 spray into  the nose daily as needed (congestion).   SIMETHICONE (GAS-X PO)    Take 1 tablet by mouth daily. Takes after the biggest meal of the day   SIMVASTATIN (ZOCOR) 40 MG TABLET    Take 40 mg by mouth at bedtime.   VITAMIN D, ERGOCALCIFEROL, (DRISDOL) 50000 UNITS CAPS    TAKE 1 CAPSULE (50,000 UNITS TOTAL) BY MOUTH EVERY 7 (SEVEN) DAYS. TAKE ON FRIDAY  Modified Medications   No medications on file  Discontinued Medications   CIPROFLOXACIN (CIPRO) 500 MG TABLET    Take 500 mg by mouth QID.   INSULIN GLARGINE (LANTUS) 100 UNIT/ML INJECTION    Inject 50 Units into the skin 2 (two) times daily.    MULTIPLE VITAMIN (MULTIVITAMIN WITH MINERALS) TABS    Take 1 tablet by mouth daily. Centrum Silver

## 2013-03-29 NOTE — Patient Instructions (Addendum)
Today we updated your med list in our EPIC system...    Continue your current medications the same...  We wrote a new prescription for DIFLUCAN to treat your yeast infection...  [name] Kitchen5today we rechecked your blood work...    We will contact you w/ the results when available...   Once we see the results we will know when to sched your follow up appt...  Call for any questions.[name] KitchenMarland [name]

## 2013-03-30 ENCOUNTER — Other Ambulatory Visit: Payer: Self-pay | Admitting: Pulmonary Disease

## 2013-03-30 DIAGNOSIS — D500 Iron deficiency anemia secondary to blood loss (chronic): Secondary | ICD-10-CM

## 2013-03-30 DIAGNOSIS — D509 Iron deficiency anemia, unspecified: Secondary | ICD-10-CM

## 2013-03-31 ENCOUNTER — Telehealth: Payer: Self-pay

## 2013-03-31 NOTE — Telephone Encounter (Signed)
French Ana with [facility] leftvoice mail stating pt's cbg has been around 350, last week you increased the Lantus, but pt's cbg remains high, please advise French Ana with [facility] [phone].

## 2013-03-31 NOTE — Telephone Encounter (Signed)
Can increase Lantus to 55 units 2x a day and even to 60 units 2x a day if still high CBGs (and no lows), but, again, I am not sure we can do this without short-acting insulin. Needs a new appt with Dr Everardo All when he comes back from vacation >> discuss short acting insulin vs other meds along with Lantus

## 2013-04-01 ENCOUNTER — Telehealth: Payer: Self-pay | Admitting: Pulmonary Disease

## 2013-04-01 MED ORDER — INSULIN GLARGINE 100 UNIT/ML ~~LOC~~ SOLN: 50.0000 [IU] | Freq: Two times a day (BID) | SUBCUTANEOUS | Status: DC

## 2013-04-01 NOTE — Telephone Encounter (Signed)
French Ana with [facility] advised

## 2013-04-01 NOTE — Telephone Encounter (Signed)
Rx has been sent in. Pt is aware. 

## 2013-04-05 ENCOUNTER — Ambulatory Visit (INDEPENDENT_AMBULATORY_CARE_PROVIDER_SITE_OTHER): Payer: Medicare Other | Admitting: Endocrinology

## 2013-04-05 ENCOUNTER — Encounter: Payer: Self-pay | Admitting: Endocrinology

## 2013-04-05 VITALS — BP 126/80 | HR 65 | Ht 65.0 in | Wt 214.0 lb

## 2013-04-05 DIAGNOSIS — E1049 Type 1 diabetes mellitus with other diabetic neurological complication: Secondary | ICD-10-CM

## 2013-04-05 MED ORDER — INSULIN GLARGINE 100 UNIT/ML SOLOSTAR PEN: 120.0000 [IU] | PEN_INJECTOR | Freq: Every day | SUBCUTANEOUS | Status: DC

## 2013-04-05 NOTE — Patient Instructions (Addendum)
check your blood sugar twice a day.  vary the time of day when you check, between before the 3 meals, and at bedtime.  also check if you have symptoms of your blood sugar being too high or too low.  please keep a record of the readings and bring it to your next appointment here.  please call us sooner if your blood sugar goes below 70, or if you have a lot of readings over 200.  Also, please increase the lantus to 120 units daily.   we will need to take this complex situation in stages.   Please come back for a follow-up appointment in 2 weeks.

## 2013-04-05 NOTE — Progress Notes (Signed)
Subjective:    Patient ID: [name], female    DOB: 19-Mar-1941, 72 y.o.   MRN: [id number]  HPI pt returns for f/u of insulin-requiring DM (dx'ed 2004; she has moderate neuropathy of the lower extremities; she has associated CAD; the interpretation of a1c's has been limited by blood transfusions; she needs a simple insulin regimen).  no cbg record, but states cbg's vary from 275-475.   Past Medical History  Diagnosis Date  . Chronic airway obstruction, not elsewhere classified   . Unspecified chronic bronchitis   . Unspecified essential hypertension   . Coronary atherosclerosis of unspecified type of vessel, native or graft   . Palpitations   . Aortic valve disorders   . Pure hypercholesterolemia   . Morbid obesity   . Esophageal reflux   . Angiodysplasia of intestine (without mention of hemorrhage)   . Diverticulosis of colon (without mention of hemorrhage)   . Benign neoplasm of colon   . Autoimmune hepatitis   . Cystitis, unspecified   . Osteoarthrosis, unspecified whether generalized or localized, unspecified site   . Osteoporosis, unspecified   . Restless legs syndrome (RLS)   . Major depressive disorder, recurrent episode, severe, specified as with psychotic behavior   . Iron deficiency anemia secondary to blood loss (chronic)   . Coarse tremors     "just in my arms"  . Carpal tunnel syndrome on both sides   . Dysrhythmia     palpitations  . CHF (congestive heart failure)   . Asthma   . Shortness of breath     "any time really"  . Sleep apnea     "but I don't use the equipment"  . Type II diabetes mellitus   . Blood transfusion   . H/O hiatal hernia   . 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 Adenomatous colon polyp   . Anginal pain   . Anxiety   . Pneumonia   . Heart murmur   . AVM (arteriovenous malformation)   . Esophageal varices   . Portal hypertensive gastropathy   . Falls frequently   . UTI (lower urinary tract infection)     Past Surgical  History  Procedure Laterality Date  . Hemorroidectomy    . Appendectomy    . Vaginal hysterectomy    . Breast biopsy      bilateral  . Cesarean section      x 3  . Appendectomy    . Esophagogastroduodenoscopy  06/12/2012    Procedure: ESOPHAGOGASTRODUODENOSCOPY (EGD);  Surgeon: Hart Carwin, MD;  Location: [facility];  Service: Endoscopy;  Laterality: N/A;  . Balloon dilation  06/12/2012    Procedure: BALLOON DILATION;  Surgeon: Hart Carwin, MD;  Location: [facility];  Service: Endoscopy;  Laterality: N/A;  ?balloon  . Esophagogastroduodenoscopy  09/10/2012    Procedure: ESOPHAGOGASTRODUODENOSCOPY (EGD);  Surgeon: Hart Carwin, MD;  Location: [facility];  Service: Endoscopy;  Laterality: N/A;  . Hot hemostasis  09/10/2012    Procedure: HOT HEMOSTASIS (ARGON PLASMA COAGULATION/BICAP);  Surgeon: Hart Carwin, MD;  Location: [facility];  Service: Endoscopy;  Laterality: N/A;  . Esophagogastroduodenoscopy N/A 01/18/2013    Procedure: ESOPHAGOGASTRODUODENOSCOPY (EGD);  Surgeon: Hart Carwin, MD;  Location: [facility]COPY;  Service: Endoscopy;  Laterality: N/A;    History   Social History  . Marital Status: Married    Spouse Name: jerry Sligh    Number of Children: 4  . Years of Education: 9  Occupational History  . Disabled   .     Social History Main Topics  . Smoking status: Former Smoker -- 1.50 packs/day for 25 years    Types: Cigarettes    Quit date: 08/26/1992  . Smokeless tobacco: Never Used  . Alcohol Use: No     Comment: 11/22/11 "last alcoholic drink was years ago"  . Drug Use: No  . Sexual Activity: No   Other Topics Concern  . Not on file   Social History Narrative   Patient lives at home with her husband [name]).    Retired.   Caffeine- None    Right handed.    Current Outpatient Prescriptions on File Prior to Visit  Medication Sig Dispense Refill  . albuterol (PROAIR HFA) 108 (90 BASE) MCG/ACT inhaler Inhale 2 puffs into the lungs every 6  (six) hours as needed for shortness of breath.       . ALPRAZolam (XANAX) 1 MG tablet Take 1 mg by mouth 2 (two) times daily as needed for anxiety.       [name] atenolol (TENORMIN) 50 MG tablet Take 50 mg by mouth daily.      [name] b complex vitamins tablet Take 1 tablet by mouth daily.       . B-D ULTRAFINE III SHORT PEN 31G X 8 MM MISC USE AS DIRECTED  100 each  3  . buPROPion (WELLBUTRIN XL) 150 MG 24 hr tablet Take 150 mg by mouth daily.      . cephALEXin (KEFLEX) 500 MG capsule Take 1 capsule (500 mg total) by mouth 4 (four) times daily.  20 capsule  0  . desvenlafaxine (PRISTIQ) 100 MG 24 hr tablet Take 100 mg by mouth daily.      [name] dextromethorphan-guaiFENesin (MUCINEX DM) 30-600 MG per 12 hr tablet Take 1-2 tablets by mouth every 12 (twelve) hours.       . fluconazole (DIFLUCAN) 100 MG tablet Take 2 tablets today then 1 daily until gone  6 tablet  0  . Fluticasone-Salmeterol (ADVAIR DISKUS) 250-50 MCG/DOSE AEPB Inhale 1 puff into the lungs 2 (two) times daily.  28 each  0  . FREESTYLE LITE test strip TEST TWICE A DAY  50 each  6  . furosemide (LASIX) 20 MG tablet Take 3 tablets (60 mg total) by mouth daily.  90 tablet  0  . lisinopril (PRINIVIL,ZESTRIL) 5 MG tablet TAKE 1 TABLET (5 MG TOTAL) BY MOUTH DAILY.  30 tablet  6  . Multiple Vitamins-Minerals (CENTRUM SILVER PO) Take by mouth daily.      . pantoprazole (PROTONIX) 40 MG tablet Take 40 mg by mouth daily.      . polyethylene glycol (MIRALAX / GLYCOLAX) packet Take 17 g by mouth daily as needed (constipation).      . potassium chloride SA (K-DUR,KLOR-CON) 10 MEQ tablet Take 1 tablet (10 mEq total) by mouth every other day.  30 tablet  1  . promethazine (PHENERGAN) 25 MG tablet Take 25 mg by mouth 2 (two) times daily as needed for nausea.      [name] QUEtiapine (SEROQUEL) 100 MG tablet Take 400 mg by mouth at bedtime.       [name] rOPINIRole (REQUIP) 4 MG tablet Take 4 mg by mouth at bedtime.      . Saline (OCEAN NASAL SPRAY NA) Place 1 spray into the  nose daily as needed (congestion).      . Simethicone (GAS-X PO) Take 1 tablet by mouth daily. Takes after  the biggest meal of the day      . simvastatin (ZOCOR) 40 MG tablet Take 40 mg by mouth at bedtime.      . Vitamin D, Ergocalciferol, (DRISDOL) 50000 UNITS CAPS TAKE 1 CAPSULE (50,000 UNITS TOTAL) BY MOUTH EVERY 7 (SEVEN) DAYS. TAKE ON FRIDAY  5 capsule  8   No current facility-administered medications on file prior to visit.    Allergies  Allergen Reactions  . Aspirin Other (See Comments)    Causes nosebleeds  . Penicillins Itching  . Theophylline Nausea And Vomiting    Family History  Problem Relation Age of Onset  . Heart disease Mother   . Cervical cancer Mother   . Kidney disease Mother   . Diabetes Mother   . Breast cancer Sister   . Multiple sclerosis Sister   . Colon cancer Neg Hx   . Breast cancer Maternal Aunt     x 2  . Diabetes Father   . Diabetes Sister   . Multiple sclerosis Other    BP 126/80  Pulse 65  Ht 5\' 5"  (1.651 m)  Wt 214 lb (97.07 kg)  BMI 35.61 kg/m2  SpO2 92%  Review of Systems denies hypoglycemia and weight change    Objective:   Physical Exam VITAL SIGNS:  See vs page GENERAL: no distress      Assessment & Plan:  DM: she needs increased rx.  This insulin regimen was chosen from multiple options, for its simplicity.  The benefits of glycemic control must be weighed against the risks of hypoglycemia.

## 2013-04-12 ENCOUNTER — Telehealth: Payer: Self-pay

## 2013-04-12 NOTE — Telephone Encounter (Signed)
Please increase lantus to 140 units qd

## 2013-04-12 NOTE — Telephone Encounter (Signed)
Pt advised.

## 2013-04-12 NOTE — Telephone Encounter (Signed)
Pt states her cbg has been 300-400 in the am and evening for several weeks, please advise [phone]

## 2013-04-13 ENCOUNTER — Other Ambulatory Visit (INDEPENDENT_AMBULATORY_CARE_PROVIDER_SITE_OTHER): Payer: Medicare Other

## 2013-04-13 ENCOUNTER — Encounter: Payer: Self-pay | Admitting: Internal Medicine

## 2013-04-13 ENCOUNTER — Ambulatory Visit (INDEPENDENT_AMBULATORY_CARE_PROVIDER_SITE_OTHER): Payer: Medicare Other | Admitting: Internal Medicine

## 2013-04-13 VITALS — BP 124/60 | HR 68 | Ht 64.0 in | Wt 215.1 lb

## 2013-04-13 DIAGNOSIS — K31819 Angiodysplasia of stomach and duodenum without bleeding: Secondary | ICD-10-CM

## 2013-04-13 DIAGNOSIS — K766 Portal hypertension: Secondary | ICD-10-CM

## 2013-04-13 DIAGNOSIS — R195 Other fecal abnormalities: Secondary | ICD-10-CM

## 2013-04-13 LAB — IBC PANEL
Saturation Ratios: 23.2 % (ref 20.0–50.0)
Transferrin: 274.5 mg/dL (ref 212.0–360.0)

## 2013-04-13 LAB — CBC WITH DIFFERENTIAL/PLATELET
Basophils Absolute: 0 10*3/uL (ref 0.0–0.1)
Basophils Relative: 0.2 % (ref 0.0–3.0)
Eosinophils Relative: 4.1 % (ref 0.0–5.0)
HCT: 26.9 % — ABNORMAL LOW (ref 36.0–46.0)
Hemoglobin: 9.3 g/dL — ABNORMAL LOW (ref 12.0–15.0)
Lymphocytes Relative: 10.7 % — ABNORMAL LOW (ref 12.0–46.0)
Lymphs Abs: 0.5 10*3/uL — ABNORMAL LOW (ref 0.7–4.0)
Monocytes Relative: 8.2 % (ref 3.0–12.0)
Neutro Abs: 3.3 10*3/uL (ref 1.4–7.7)
RBC: 2.76 Mil/uL — ABNORMAL LOW (ref 3.87–5.11)
RDW: 19.1 % — ABNORMAL HIGH (ref 11.5–14.6)
WBC: 4.3 10*3/uL — ABNORMAL LOW (ref 4.5–10.5)

## 2013-04-13 MED ORDER — SUCRALFATE 1 GM/10ML PO SUSP: 1.0000 g | Freq: Two times a day (BID) | ORAL | Status: DC

## 2013-04-13 NOTE — Progress Notes (Signed)
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 She is post endoscopic ablation of AVMs in April 2014. She has chronic iron deficiency anemia requiring iron infusions. A recent small bowel capsule endoscopy confirmed the presence of AVMs in the small bowel. Her last iron infusion was in mid July 2014. Her last hemoglobin was 8.8 and her hematocrit was 26.5. She has mild chronic insufficiency with a creatinine of 1.4 and BUN of 30. She is complaining of epigastric discomfort, belching and fullness.   Past Medical History  Diagnosis Date  . Chronic airway obstruction, not elsewhere classified   . Unspecified chronic bronchitis   . Unspecified essential hypertension   . Coronary atherosclerosis of unspecified type of vessel, native or graft   . Palpitations   . Aortic valve disorders   . Pure hypercholesterolemia   . Morbid obesity   . Esophageal reflux   . Angiodysplasia of intestine (without mention of hemorrhage)   . Diverticulosis of colon (without mention of hemorrhage)   . Benign neoplasm of colon   . Autoimmune hepatitis   . Cystitis, unspecified   . Osteoarthrosis, unspecified whether generalized or localized, unspecified site   . Osteoporosis, unspecified   . Restless legs syndrome (RLS)   . Major depressive disorder, recurrent episode, severe, specified as with psychotic behavior   . Iron deficiency anemia secondary to blood loss (chronic)   . Coarse tremors     "just in my arms"  . Carpal tunnel syndrome on both sides   . Dysrhythmia     palpitations  . CHF (congestive heart failure)   . Asthma   . Shortness of breath     "any time really"  . Sleep apnea     "but I don't use the equipment"  . Type II diabetes mellitus   . Blood transfusion   . H/O hiatal hernia   . 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 Adenomatous colon polyp   . Anginal pain   .  Anxiety   . Pneumonia   . Heart murmur   . AVM (arteriovenous malformation)   . Esophageal varices   . Portal hypertensive gastropathy   . Falls frequently   . UTI (lower urinary tract infection)    Past Surgical History  Procedure Laterality Date  . Hemorroidectomy    . Appendectomy    . Vaginal hysterectomy    . Breast biopsy      bilateral  . Cesarean section      x 3  . Appendectomy    . Esophagogastroduodenoscopy  06/12/2012    Procedure: ESOPHAGOGASTRODUODENOSCOPY (EGD);  Surgeon: Hart Carwin, MD;  Location: [facility];  Service: Endoscopy;  Laterality: N/A;  . Balloon dilation  06/12/2012    Procedure: BALLOON DILATION;  Surgeon: Hart Carwin, MD;  Location: [facility];  Service: Endoscopy;  Laterality: N/A;  ?balloon  . Esophagogastroduodenoscopy  09/10/2012    Procedure: ESOPHAGOGASTRODUODENOSCOPY (EGD);  Surgeon: Hart Carwin, MD;  Location: [facility];  Service: Endoscopy;  Laterality: N/A;  . Hot hemostasis  09/10/2012    Procedure: HOT HEMOSTASIS (ARGON PLASMA COAGULATION/BICAP);  Surgeon: Hart Carwin, MD;  Location: [facility];  Service: Endoscopy;  Laterality: N/A;  . Esophagogastroduodenoscopy N/A 01/18/2013    Procedure: ESOPHAGOGASTRODUODENOSCOPY (EGD);  Surgeon: Hart Carwin, MD;  Location: [facility];  Service: Endoscopy;  Laterality: N/A;  reports that she quit smoking about 20 years ago. Her smoking use included Cigarettes. She has a 37.5 pack-year smoking history. She has never used smokeless tobacco. She reports that she does not drink alcohol or use illicit drugs. family history includes Breast cancer in her maternal aunt and sister; Cervical cancer in her mother; Diabetes in her father, mother, and sister; Heart disease in her mother; Kidney disease in her mother; Multiple sclerosis in her other and sister. There is no history of Colon cancer. Allergies  Allergen Reactions  . Aspirin Other (See Comments)    Causes nosebleeds  . Penicillins  Itching  . Theophylline Nausea And Vomiting        Review of Systems:Denies dysphagia nausea vomiting  The remainder of the 10 point ROS is negative except as outlined in H&P   Physical Exam: General appearance  Well developed, in no distress.Overweight. Looks pale  Eyes- non icteric. HEENT nontraumatic, normocephalic. Mouth no lesions, tongue papillated, no cheilosis. Neck supple without adenopathy, thyroid not enlarged, no carotid bruits, no JVD. Lungs .Mild inspiratory crackles  Cor normal S1, normal S2, regular rhythm, no murmur,  quiet precordium. Abdomen: Obese, soft, nontender with normoactive bowel sounds. No ascites.  Rectal:Soft trace Hemoccult-positive stool. Extremities no pedal edema. Skin no lesions. Neurological alert and oriented x 3.No asterixis  Psychological normal mood and affect.  Assessment and Plan  Problem #7 72 year old white female with ongoing GI blood loss likely related to portal hypertension and AVMs. She is to continue on her proton pump inhibitor and continue close monitoring of her hemoglobin and iron levels.  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 Patient has never had a liver biopsy. She has a low positive ANA titer.  Problem #3 Dyspepsia, likely due to gastropathy. We will add Carafate slurry 10 cc by mouth twice a day.   04/13/2013 [name]

## 2013-04-13 NOTE — Patient Instructions (Addendum)
We have sent the following medications to your pharmacy for you to pick up at your convenience: Carafate  Your physician has requested that you go to the basement for the following lab work before leaving today: CBC, IBC  CC:Dr Alroy Dust, Saul Fordyce, ANP, [facility], [facility] (Fax [phone])

## 2013-04-14 ENCOUNTER — Encounter: Payer: Self-pay | Admitting: Internal Medicine

## 2013-04-19 ENCOUNTER — Other Ambulatory Visit (HOSPITAL_COMMUNITY): Payer: Self-pay | Admitting: Pulmonary Disease

## 2013-04-23 ENCOUNTER — Ambulatory Visit: Payer: Medicare Other | Admitting: Endocrinology

## 2013-04-24 ENCOUNTER — Encounter (HOSPITAL_COMMUNITY): Payer: Self-pay | Admitting: *Deleted

## 2013-04-24 ENCOUNTER — Emergency Department (HOSPITAL_COMMUNITY)
Admission: EM | Admit: 2013-04-24 | Discharge: 2013-04-24 | Disposition: A | Discharge status: Home / Self Care | Payer: Medicare Other | Attending: Emergency Medicine | Admitting: Emergency Medicine

## 2013-04-24 ENCOUNTER — Emergency Department (HOSPITAL_COMMUNITY): Payer: Medicare Other

## 2013-04-24 DIAGNOSIS — I2510 Atherosclerotic heart disease of native coronary artery without angina pectoris: Secondary | ICD-10-CM | POA: Insufficient documentation

## 2013-04-24 DIAGNOSIS — R635 Abnormal weight gain: Secondary | ICD-10-CM | POA: Insufficient documentation

## 2013-04-24 DIAGNOSIS — R0989 Other specified symptoms and signs involving the circulatory and respiratory systems: Secondary | ICD-10-CM | POA: Insufficient documentation

## 2013-04-24 DIAGNOSIS — Z862 Personal history of diseases of the blood and blood-forming organs and certain disorders involving the immune mechanism: Secondary | ICD-10-CM | POA: Insufficient documentation

## 2013-04-24 DIAGNOSIS — E7800 Pure hypercholesterolemia, unspecified: Secondary | ICD-10-CM | POA: Insufficient documentation

## 2013-04-24 DIAGNOSIS — I10 Essential (primary) hypertension: Secondary | ICD-10-CM

## 2013-04-24 DIAGNOSIS — Z8669 Personal history of other diseases of the nervous system and sense organs: Secondary | ICD-10-CM | POA: Insufficient documentation

## 2013-04-24 DIAGNOSIS — F333 Major depressive disorder, recurrent, severe with psychotic symptoms: Secondary | ICD-10-CM | POA: Insufficient documentation

## 2013-04-24 DIAGNOSIS — I5033 Acute on chronic diastolic (congestive) heart failure: Secondary | ICD-10-CM | POA: Insufficient documentation

## 2013-04-24 DIAGNOSIS — R05 Cough: Secondary | ICD-10-CM | POA: Insufficient documentation

## 2013-04-24 DIAGNOSIS — Z8739 Personal history of other diseases of the musculoskeletal system and connective tissue: Secondary | ICD-10-CM | POA: Insufficient documentation

## 2013-04-24 DIAGNOSIS — R011 Cardiac murmur, unspecified: Secondary | ICD-10-CM | POA: Insufficient documentation

## 2013-04-24 DIAGNOSIS — Z8679 Personal history of other diseases of the circulatory system: Secondary | ICD-10-CM | POA: Insufficient documentation

## 2013-04-24 DIAGNOSIS — K219 Gastro-esophageal reflux disease without esophagitis: Secondary | ICD-10-CM | POA: Insufficient documentation

## 2013-04-24 DIAGNOSIS — R112 Nausea with vomiting, unspecified: Secondary | ICD-10-CM | POA: Insufficient documentation

## 2013-04-24 DIAGNOSIS — J45901 Unspecified asthma with (acute) exacerbation: Secondary | ICD-10-CM | POA: Insufficient documentation

## 2013-04-24 DIAGNOSIS — Z87891 Personal history of nicotine dependence: Secondary | ICD-10-CM | POA: Insufficient documentation

## 2013-04-24 DIAGNOSIS — F411 Generalized anxiety disorder: Secondary | ICD-10-CM | POA: Insufficient documentation

## 2013-04-24 DIAGNOSIS — Z87440 Personal history of urinary (tract) infections: Secondary | ICD-10-CM | POA: Insufficient documentation

## 2013-04-24 DIAGNOSIS — Z880 Allergy status to penicillin: Secondary | ICD-10-CM | POA: Insufficient documentation

## 2013-04-24 DIAGNOSIS — Z8701 Personal history of pneumonia (recurrent): Secondary | ICD-10-CM | POA: Insufficient documentation

## 2013-04-24 DIAGNOSIS — Z79899 Other long term (current) drug therapy: Secondary | ICD-10-CM | POA: Insufficient documentation

## 2013-04-24 DIAGNOSIS — R059 Cough, unspecified: Secondary | ICD-10-CM | POA: Insufficient documentation

## 2013-04-24 DIAGNOSIS — Z8719 Personal history of other diseases of the digestive system: Secondary | ICD-10-CM | POA: Insufficient documentation

## 2013-04-24 DIAGNOSIS — Z794 Long term (current) use of insulin: Secondary | ICD-10-CM | POA: Insufficient documentation

## 2013-04-24 DIAGNOSIS — R0609 Other forms of dyspnea: Secondary | ICD-10-CM | POA: Insufficient documentation

## 2013-04-24 DIAGNOSIS — E119 Type 2 diabetes mellitus without complications: Secondary | ICD-10-CM | POA: Insufficient documentation

## 2013-04-24 DIAGNOSIS — Z8742 Personal history of other diseases of the female genital tract: Secondary | ICD-10-CM | POA: Insufficient documentation

## 2013-04-24 DIAGNOSIS — I509 Heart failure, unspecified: Secondary | ICD-10-CM

## 2013-04-24 LAB — BASIC METABOLIC PANEL
Calcium: 8.8 mg/dL (ref 8.4–10.5)
Chloride: 98 mEq/L (ref 96–112)
Creatinine, Ser: 1.09 mg/dL (ref 0.50–1.10)
GFR calc Af Amer: 59 mL/min — ABNORMAL LOW (ref >90)

## 2013-04-24 LAB — CBC
MCV: 99.6 fL (ref 78.0–100.0)
Platelets: 79 10*3/uL — ABNORMAL LOW (ref 150–400)
RDW: 16.2 % — ABNORMAL HIGH (ref 11.5–15.5)
WBC: 3.4 10*3/uL — ABNORMAL LOW (ref 4.0–10.5)

## 2013-04-24 LAB — PRO B NATRIURETIC PEPTIDE: Pro B Natriuretic peptide (BNP): 147.3 pg/mL — ABNORMAL HIGH (ref 0–125)

## 2013-04-24 MED ORDER — FUROSEMIDE 10 MG/ML IJ SOLN
40.0000 mg | Freq: Once | INTRAMUSCULAR | Status: AC
  Administered 2013-04-24: 40 mg via INTRAVENOUS
  Filled 2013-04-24: qty 4

## 2013-04-24 NOTE — ED Provider Notes (Signed)
CSN: [id number]     Arrival date & time 04/24/13  1712 History   First MD Initiated Contact with Patient 04/24/13 1719     Chief Complaint  Patient presents with  . Shortness of Breath   (Consider location/radiation/quality/duration/timing/severity/associated sxs/prior Treatment) HPI Comments: [name] is a 72 y.o. Female who presents for evaluation of weight gain of 7 lbs. In 6 days. She states that at this level, her cardiologist likes to have her evaluated. She feels like the fluid has collected in her left abdomen. She denies chest pain. She has chronic ongoing shortness of breath with dyspnea on exertion, and is on chronic oxygen therapy at home; 3 [name] per minute. She has had a productive cough, with yellow sputum for many months. She has intermittent nausea and vomiting, chronically. She uses 8-10. Phenergan tablets every week. She saw her urologist several days ago and her GI doctor about one week ago, without specific problems uncovered. Her GI doctor recommended starting Carafate for additional control of her reflux sx. She sees her cardiologist routinely, and has an upcoming appointment in one week. She is unable to use her usual medications, as directed, but has had no relief of her weight gain. She is stooling well and eating well. She denies urinary frequency, or dysuria. There no other known modifying factors.  Patient is a 72 y.o. female presenting with shortness of breath. The history is provided by the patient.  Shortness of Breath   Past Medical History  Diagnosis Date  . Chronic airway obstruction, not elsewhere classified   . Unspecified chronic bronchitis   . Unspecified essential hypertension   . Coronary atherosclerosis of unspecified type of vessel, native or graft   . Palpitations   . Aortic valve disorders   . Pure hypercholesterolemia   . Morbid obesity   . Esophageal reflux   . Angiodysplasia of intestine (without mention of hemorrhage)   . Diverticulosis of  colon (without mention of hemorrhage)   . Benign neoplasm of colon   . Autoimmune hepatitis   . Cystitis, unspecified   . Osteoarthrosis, unspecified whether generalized or localized, unspecified site   . Osteoporosis, unspecified   . Restless legs syndrome (RLS)   . Major depressive disorder, recurrent episode, severe, specified as with psychotic behavior   . Iron deficiency anemia secondary to blood loss (chronic)   . Coarse tremors     "just in my arms"  . Carpal tunnel syndrome on both sides   . Dysrhythmia     palpitations  . CHF (congestive heart failure)   . Asthma   . Shortness of breath     "any time really"  . Sleep apnea     "but I don't use the equipment"  . Type II diabetes mellitus   . Blood transfusion   . H/O hiatal hernia   . 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 Adenomatous colon polyp   . Anginal pain   . Anxiety   . Pneumonia   . Heart murmur   . AVM (arteriovenous malformation)   . Esophageal varices   . Portal hypertensive gastropathy   . Falls frequently   . UTI (lower urinary tract infection)    Past Surgical History  Procedure Laterality Date  . Hemorroidectomy    . Appendectomy    . Vaginal hysterectomy    . Breast biopsy      bilateral  . Cesarean section      x 3  .  Appendectomy    . Esophagogastroduodenoscopy  06/12/2012    Procedure: ESOPHAGOGASTRODUODENOSCOPY (EGD);  Surgeon: Hart Carwin, MD;  Location: [facility];  Service: Endoscopy;  Laterality: N/A;  . Balloon dilation  06/12/2012    Procedure: BALLOON DILATION;  Surgeon: Hart Carwin, MD;  Location: [facility];  Service: Endoscopy;  Laterality: N/A;  ?balloon  . Esophagogastroduodenoscopy  09/10/2012    Procedure: ESOPHAGOGASTRODUODENOSCOPY (EGD);  Surgeon: Hart Carwin, MD;  Location: [facility];  Service: Endoscopy;  Laterality: N/A;  . Hot hemostasis  09/10/2012    Procedure: HOT HEMOSTASIS (ARGON PLASMA COAGULATION/BICAP);  Surgeon: Hart Carwin, MD;  Location: [facility];  Service: Endoscopy;  Laterality: N/A;  . Esophagogastroduodenoscopy N/A 01/18/2013    Procedure: ESOPHAGOGASTRODUODENOSCOPY (EGD);  Surgeon: Hart Carwin, MD;  Location: [facility] ENDOSCOPY;  Service: Endoscopy;  Laterality: N/A;   Family History  Problem Relation Age of Onset  . Heart disease Mother   . Cervical cancer Mother   . Kidney disease Mother   . Diabetes Mother   . Breast cancer Sister   . Multiple sclerosis Sister   . Colon cancer Neg Hx   . Breast cancer Maternal Aunt     x 2  . Diabetes Father   . Diabetes Sister   . Multiple sclerosis Other    History  Substance Use Topics  . Smoking status: Former Smoker -- 1.50 packs/day for 25 years    Types: Cigarettes    Quit date: 08/26/1992  . Smokeless tobacco: Never Used  . Alcohol Use: No     Comment: 11/22/11 "last alcoholic drink was years ago"   OB History   Grav Para Term Preterm Abortions TAB SAB Ect Mult Living                 Review of Systems  Respiratory: Positive for shortness of breath.   All other systems reviewed and are negative.    Allergies  Aspirin; Penicillins; and Theophylline  Home Medications   Current Outpatient Rx  Name  Route  Sig  Dispense  Refill  . albuterol (PROAIR HFA) 108 (90 BASE) MCG/ACT inhaler   Inhalation   Inhale 2 puffs into the lungs every 6 (six) hours as needed for shortness of breath.          . ALPRAZolam (XANAX) 1 MG tablet   Oral   Take 1 mg by mouth 2 (two) times daily as needed for anxiety.          . AMBULATORY NON FORMULARY MEDICATION      Continuous O2 @@ 3LMP         . atenolol (TENORMIN) 50 MG tablet   Oral   Take 50 mg by mouth daily.         [name] b complex vitamins tablet   Oral   Take 1 tablet by mouth daily.          . B-D ULTRAFINE III SHORT PEN 31G X 8 MM MISC      USE AS DIRECTED   100 each   3   . buPROPion (WELLBUTRIN XL) 150 MG 24 hr tablet   Oral   Take 150 mg by mouth daily.         [name] desvenlafaxine (PRISTIQ)  100 MG 24 hr tablet   Oral   Take 150 mg by mouth daily.         [name] dextromethorphan-guaiFENesin (MUCINEX DM) 30-600 MG per 12 hr tablet   Oral  Take 1-2 tablets by mouth every 12 (twelve) hours.          . Fluticasone-Salmeterol (ADVAIR DISKUS) 250-50 MCG/DOSE AEPB   Inhalation   Inhale 1 puff into the lungs 2 (two) times daily.   28 each   0   . FREESTYLE LITE test strip      TEST TWICE A DAY   50 each   6   . furosemide (LASIX) 20 MG tablet   Oral   Take 60 mg by mouth daily.         . insulin glargine (LANTUS) 100 UNIT/ML injection   Subcutaneous   Inject 70 Units into the skin 2 (two) times daily.         [name] lisinopril (PRINIVIL,ZESTRIL) 5 MG tablet   Oral   Take 5 mg by mouth daily.         . Multiple Vitamins-Minerals (CENTRUM SILVER PO)   Oral   Take by mouth daily.         . pantoprazole (PROTONIX) 40 MG tablet   Oral   Take 40 mg by mouth daily.         . polyethylene glycol (MIRALAX / GLYCOLAX) packet   Oral   Take 17 g by mouth daily as needed (constipation).         . potassium chloride SA (K-DUR,KLOR-CON) 10 MEQ tablet   Oral   Take 1 tablet (10 mEq total) by mouth every other day.   30 tablet   1   . promethazine (PHENERGAN) 25 MG tablet   Oral   Take 25 mg by mouth 2 (two) times daily as needed for nausea.         [name] QUEtiapine (SEROQUEL) 100 MG tablet   Oral   Take 400 mg by mouth at bedtime.          [name] rOPINIRole (REQUIP) 4 MG tablet   Oral   Take 4 mg by mouth at bedtime.         . Saline (OCEAN NASAL SPRAY NA)   Nasal   Place 1 spray into the nose daily as needed (congestion).         . Simethicone (GAS-X PO)   Oral   Take 1 tablet by mouth daily. Takes after the biggest meal of the day         . simvastatin (ZOCOR) 40 MG tablet   Oral   Take 40 mg by mouth at bedtime.         . sucralfate (CARAFATE) 1 GM/10ML suspension   Oral   Take 10 mL (1 g total) by mouth 2 (two) times daily.   420 mL    1   . Vitamin D, Ergocalciferol, (DRISDOL) 50000 UNITS CAPS capsule   Oral   Take 50,000 Units by mouth every 7 (seven) days. On fridays          BP 155/67  Pulse 54  Temp(Src) 98.7 F (37.1 C) (Oral)  Resp 20  Wt 217 lb (98.431 kg)  BMI 37.23 kg/m2  SpO2 98% Physical Exam  Nursing note and vitals reviewed. Constitutional: She is oriented to person, place, and time. She appears well-developed.  Obese elderly, frail  HENT:  Head: Normocephalic and atraumatic.  Eyes: Conjunctivae and EOM are normal. Pupils are equal, round, and reactive to light.  Neck: Normal range of motion and phonation normal. Neck supple.  Cardiovascular: Normal rate, regular rhythm and intact distal pulses.   Pulmonary/Chest: Effort normal  and breath sounds normal. She exhibits no tenderness.  Abdominal: Soft. She exhibits no distension and no mass. There is tenderness (Mild, diffuse). There is no rebound and no guarding.  Musculoskeletal: Normal range of motion. She exhibits no edema and no tenderness.  Neurological: She is alert and oriented to person, place, and time. She has normal strength. She exhibits normal muscle tone.  Skin: Skin is warm and dry.  Psychiatric: She has a normal mood and affect. Her behavior is normal. Judgment and thought content normal.    ED Course  Procedures (including critical care time)  Patient Vitals for the past 24 hrs:  BP Temp Temp src Pulse Resp SpO2 Weight  04/24/13 2200 155/67 mmHg - - 54 20 98 % -  04/24/13 2030 150/50 mmHg - - 54 21 97 % -  04/24/13 2000 142/53 mmHg - - 55 15 97 % -  04/24/13 1930 150/57 mmHg - - 51 17 97 % -  04/24/13 1900 160/73 mmHg - - 53 15 98 % -  04/24/13 1717 178/91 mmHg 98.7 F (37.1 C) Oral 62 20 97 % 217 lb (98.431 kg)   Medications  furosemide (LASIX) injection 40 mg (40 mg Intravenous Given 04/24/13 2154)    Vitals with Age-Percentiles 02/19/2013 02/18/2013 02/18/2013 02/18/2013  Length      Systolic 126 146 161 132  Diastolic  49 75 40 60  Pulse 56 64 57 56  Respiration 19 18 18 20   Weight 99.292 kg      Vitals with Age-Percentiles 02/18/2013  Length   Systolic 95  Diastolic 39  Pulse 50  Respiration 19  Weight 98.975 kg      Date: 04/24/13  Rate: 62  Rhythm: normal sinus rhythm  QRS Axis: normal  PR and QT Intervals: PR prolonged  ST/T Wave abnormalities: normal  PR and QRS Conduction Disutrbances:none  Narrative Interpretation:   Old EKG Reviewed: unchanged- 03/22/13  8:31 PM Reevaluation with update and discussion. After initial assessment and treatment, an updated evaluation reveals no change in status. She is eating now. She states that she occasionally has a sensation that food catches in her midesophagus, while eating. This is a chronic complaint. [name]   8:33 PM-Consult complete with Dr. Ihor Gully. Patient case explained and discussed. He agrees to evaluate patient in the ED to assess for further evaluation and treatment. Call ended at 2040   10:41 PM Reevaluation with update and discussion. After initial assessment and treatment, an updated evaluation reveals No further c/o. Tolerated IV Lasix. Has been seen by Cardiology, see note.Mancel Bale [name]   Labs Review Labs Reviewed  CBC - Abnormal; Notable for the following:    WBC 3.4 (*)    RBC 2.74 (*)    Hemoglobin 9.0 (*)    HCT 27.3 (*)    RDW 16.2 (*)    Platelets 79 (*)    All other components within normal limits  BASIC METABOLIC PANEL - Abnormal; Notable for the following:    Sodium 134 (*)    Glucose, Bld 369 (*)    BUN 29 (*)    GFR calc non Af Amer 51 (*)    GFR calc Af Amer 59 (*)    All other components within normal limits  PRO B NATRIURETIC PEPTIDE - Abnormal; Notable for the following:    Pro B Natriuretic peptide (BNP) 147.3 (*)    All other components within normal limits   Imaging Review Dg Chest Port 1 View  04/24/2013   *  RADIOLOGY REPORT*  Clinical Data: Short of breath.  PORTABLE CHEST - 1 VIEW   Comparison: 03/22/2013.  Findings: Cardiomegaly is present with interstitial pulmonary edema.  Vascular congestion. Monitoring leads are projected over the chest.  Mediastinal contours appears similar to prior. Prominence of the right hilum is chronic.  IMPRESSION: Mild CHF with interstitial pulmonary edema.   Original Report Authenticated By: Andreas Newport, M.D.    MDM   1. Diastolic congestive heart failure, acute on chronic   2. Weight gain   3. Hypertension      Nonspecific weight gain, by report, the patient. She does not know what her baseline weight is. Her weight today is identical to the weight when she was discharged on 02/19/2013, after treatment for CHF exacerbation. Today, there is no evidence for acute cardiopulmonary instability. Her labs are essentially at baseline with mild hyperglycemia that is recurrent. There is no evidence for acute doses metabolic instability, or evidence for occult infection.    Nursing Notes Reviewed/ Care Coordinated, and agree without changes. Applicable Imaging Reviewed.  Interpretation of Laboratory Data incorporated into ED treatment   Plan: Home Medications- Increased dose Lasix (60mg  BID for 3 days); Home Treatments and Observation- Rest, watch salt intake; return here if the recommended treatment, does not improve the symptoms; Recommended follow up- Cardiology or PCP if not better in 2-3 days.      Flint Melter, MD 04/24/13 [phone]

## 2013-04-24 NOTE — Consult Note (Addendum)
Reason for Consult: Shortness of breath Referring Physician: Juli Odom is an 72 y.o. female.  HPI: Ms Buis is a pleasant 72 year old female with a history of HFpEF, morbid obesity, COPD requiring continuous oxygen, mild AS, HTN, HLD, OSA and diabetes who presents with 7 lb weight gain over the past week. She says that she has been short of breath for years. She denies any significant change in her exercise capacity, or symptom level. She is here because she was told if her weight were to go up she should seek care. The patient has not had any lower extremity edema. She might have some abdominal swelling. She hasn't had any change in her ability to sleep supine in bed. She has not required any higher level of supplemental oxygen. She has not tried to take a higher dose of lasix. She denies fever, chills, or signs/symptoms of infection. She has not had any chest pain. She has had an echocardiogram here in the past 6 months which showed preserved LV systolic function with moderate diastolic dysfunction. She also has some elevated pulmonary pressures. She had a left heart cath back in 2009 that showed essentially normal coronary arteries. She is only on atenolol for her hypertension and she is not on an ACEi or ARB. She doesn't recall having any allergy to either of those classes of medications. Today in the ED her blood pressure is slightly elevated, around 150 systolic. Most of her clinic visits have blood pressure around 110-120. She doesn't check her BP regularly.   Past Medical History  Diagnosis Date  . Chronic airway obstruction, not elsewhere classified   . Unspecified chronic bronchitis   . Unspecified essential hypertension   . Coronary atherosclerosis of unspecified type of vessel, native or graft   . Palpitations   . Aortic valve disorders   . Pure hypercholesterolemia   . Morbid obesity   . Esophageal reflux   . Angiodysplasia of intestine (without mention of hemorrhage)   .  Diverticulosis of colon (without mention of hemorrhage)   . Benign neoplasm of colon   . Autoimmune hepatitis   . Cystitis, unspecified   . Osteoarthrosis, unspecified whether generalized or localized, unspecified site   . Osteoporosis, unspecified   . Restless legs syndrome (RLS)   . Major depressive disorder, recurrent episode, severe, specified as with psychotic behavior   . Iron deficiency anemia secondary to blood loss (chronic)   . Coarse tremors     "just in my arms"  . Carpal tunnel syndrome on both sides   . Dysrhythmia     palpitations  . CHF (congestive heart failure)   . Asthma   . Shortness of breath     "any time really"  . Sleep apnea     "but I don't use the equipment"  . Type II diabetes mellitus   . Blood transfusion   . H/O hiatal hernia   . 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 Adenomatous colon polyp   . Anginal pain   . Anxiety   . Pneumonia   . Heart murmur   . AVM (arteriovenous malformation)   . Esophageal varices   . Portal hypertensive gastropathy   . Falls frequently   . UTI (lower urinary tract infection)     Past Surgical History  Procedure Laterality Date  . Hemorroidectomy    . Appendectomy    . Vaginal hysterectomy    . Breast biopsy  bilateral  . Cesarean section      x 3  . Appendectomy    . Esophagogastroduodenoscopy  06/12/2012    Procedure: ESOPHAGOGASTRODUODENOSCOPY (EGD);  Surgeon: Hart Carwin, MD;  Location: [facility];  Service: Endoscopy;  Laterality: N/A;  . Balloon dilation  06/12/2012    Procedure: BALLOON DILATION;  Surgeon: Hart Carwin, MD;  Location: [facility];  Service: Endoscopy;  Laterality: N/A;  ?balloon  . Esophagogastroduodenoscopy  09/10/2012    Procedure: ESOPHAGOGASTRODUODENOSCOPY (EGD);  Surgeon: Hart Carwin, MD;  Location: [facility];  Service: Endoscopy;  Laterality: N/A;  . Hot hemostasis  09/10/2012    Procedure: HOT HEMOSTASIS (ARGON PLASMA COAGULATION/BICAP);  Surgeon: Hart Carwin,  MD;  Location: [facility];  Service: Endoscopy;  Laterality: N/A;  . Esophagogastroduodenoscopy N/A 01/18/2013    Procedure: ESOPHAGOGASTRODUODENOSCOPY (EGD);  Surgeon: Hart Carwin, MD;  Location: [facility] ENDOSCOPY;  Service: Endoscopy;  Laterality: N/A;    Family History  Problem Relation Age of Onset  . Heart disease Mother   . Cervical cancer Mother   . Kidney disease Mother   . Diabetes Mother   . Breast cancer Sister   . Multiple sclerosis Sister   . Colon cancer Neg Hx   . Breast cancer Maternal Aunt     x 2  . Diabetes Father   . Diabetes Sister   . Multiple sclerosis Other     Social History:  reports that she quit smoking about 20 years ago. Her smoking use included Cigarettes. She has a 37.5 pack-year smoking history. She has never used smokeless tobacco. She reports that she does not drink alcohol or use illicit drugs.  Allergies:  Allergies  Allergen Reactions  . Aspirin Other (See Comments)    Causes nosebleeds  . Penicillins Itching  . Theophylline Nausea And Vomiting    Medications: I have reviewed the patient's current medications. No current facility-administered medications on file prior to encounter.   Current Outpatient Prescriptions on File Prior to Encounter  Medication Sig Dispense Refill  . albuterol (PROAIR HFA) 108 (90 BASE) MCG/ACT inhaler Inhale 2 puffs into the lungs every 6 (six) hours as needed for shortness of breath.       . ALPRAZolam (XANAX) 1 MG tablet Take 1 mg by mouth 2 (two) times daily as needed for anxiety.       . AMBULATORY NON FORMULARY MEDICATION Continuous O2 @@ 3LMP      . atenolol (TENORMIN) 50 MG tablet Take 50 mg by mouth daily.      [name] b complex vitamins tablet Take 1 tablet by mouth daily.       . B-D ULTRAFINE III SHORT PEN 31G X 8 MM MISC USE AS DIRECTED  100 each  3  . buPROPion (WELLBUTRIN XL) 150 MG 24 hr tablet Take 150 mg by mouth daily.      [name] dextromethorphan-guaiFENesin (MUCINEX DM) 30-600 MG per 12 hr tablet Take  1-2 tablets by mouth every 12 (twelve) hours.       . Fluticasone-Salmeterol (ADVAIR DISKUS) 250-50 MCG/DOSE AEPB Inhale 1 puff into the lungs 2 (two) times daily.  28 each  0  . FREESTYLE LITE test strip TEST TWICE A DAY  50 each  6  . Multiple Vitamins-Minerals (CENTRUM SILVER PO) Take by mouth daily.      . pantoprazole (PROTONIX) 40 MG tablet Take 40 mg by mouth daily.      . polyethylene glycol (MIRALAX / GLYCOLAX) packet Take 17 g  by mouth daily as needed (constipation).      . potassium chloride SA (K-DUR,KLOR-CON) 10 MEQ tablet Take 1 tablet (10 mEq total) by mouth every other day.  30 tablet  1  . promethazine (PHENERGAN) 25 MG tablet Take 25 mg by mouth 2 (two) times daily as needed for nausea.      [name] QUEtiapine (SEROQUEL) 100 MG tablet Take 400 mg by mouth at bedtime.       [name] rOPINIRole (REQUIP) 4 MG tablet Take 4 mg by mouth at bedtime.      . Saline (OCEAN NASAL SPRAY NA) Place 1 spray into the nose daily as needed (congestion).      . Simethicone (GAS-X PO) Take 1 tablet by mouth daily. Takes after the biggest meal of the day      . simvastatin (ZOCOR) 40 MG tablet Take 40 mg by mouth at bedtime.      . sucralfate (CARAFATE) 1 GM/10ML suspension Take 10 mL (1 g total) by mouth 2 (two) times daily.  420 mL  1     Results for orders placed during the hospital encounter of 04/24/13 (from the past 48 hour(s))  CBC     Status: Abnormal   Collection Time    04/24/13  6:05 PM      Result Value Range   WBC 3.4 (*) 4.0 - 10.5 K/uL   RBC 2.74 (*) 3.87 - 5.11 MIL/uL   Hemoglobin 9.0 (*) 12.0 - 15.0 g/dL   HCT 16.1 (*) 09.6 - 04.5 %   MCV 99.6  78.0 - 100.0 fL   MCH 32.8  26.0 - 34.0 pg   MCHC 33.0  30.0 - 36.0 g/dL   RDW 40.9 (*) 81.1 - 91.4 %   Platelets 79 (*) 150 - 400 K/uL   Comment: PLATELET COUNT CONFIRMED BY SMEAR  BASIC METABOLIC PANEL     Status: Abnormal   Collection Time    04/24/13  6:05 PM      Result Value Range   Sodium 134 (*) 135 - 145 mEq/L   Potassium 4.4   3.5 - 5.1 mEq/L   Chloride 98  96 - 112 mEq/L   CO2 24  19 - 32 mEq/L   Glucose, Bld 369 (*) 70 - 99 mg/dL   BUN 29 (*) 6 - 23 mg/dL   Creatinine, Ser 7.82  0.50 - 1.10 mg/dL   Calcium 8.8  8.4 - 95.6 mg/dL   GFR calc non Af Amer 51 (*) >90 mL/min   GFR calc Af Amer 59 (*) >90 mL/min   Comment: (NOTE)     The eGFR has been calculated using the CKD EPI equation.     This calculation has not been validated in all clinical situations.     eGFR's persistently <90 mL/min signify possible Chronic Kidney     Disease.  PRO B NATRIURETIC PEPTIDE     Status: Abnormal   Collection Time    04/24/13  6:05 PM      Result Value Range   Pro B Natriuretic peptide (BNP) 147.3 (*) 0 - 125 pg/mL    Dg Chest Port 1 View  04/24/2013   *RADIOLOGY REPORT*  Clinical Data: Short of breath.  PORTABLE CHEST - 1 VIEW  Comparison: 03/22/2013.  Findings: Cardiomegaly is present with interstitial pulmonary edema.  Vascular congestion. Monitoring leads are projected over the chest.  Mediastinal contours appears similar to prior. Prominence of the right hilum is chronic.  IMPRESSION: Mild CHF  with interstitial pulmonary edema.   Original Report Authenticated By: Andreas Newport, M.D.    Review of Systems  Constitutional: Negative.   HENT: Negative.   Eyes: Negative.   Respiratory: Positive for shortness of breath.   Cardiovascular: Positive for orthopnea and PND.  Gastrointestinal: Negative.   Genitourinary: Negative.   Musculoskeletal: Negative.   Skin: Negative.   Neurological: Negative.   Endo/Heme/Allergies: Negative.   Psychiatric/Behavioral: Negative.   All other systems reviewed and are negative.   Blood pressure 150/50, pulse 54, temperature 98.7 F (37.1 C), temperature source Oral, resp. rate 21, weight 217 lb (98.431 kg), SpO2 97.00%. Physical Exam  Nursing note and vitals reviewed. Constitutional: No distress.  Neck:  Less than 10cm JVD  Cardiovascular: Normal rate, regular rhythm and intact  distal pulses.  Exam reveals no friction rub.   Murmur heard.  Systolic murmur is present with a grade of 2/6  Pulses:      Dorsalis pedis pulses are 2+ on the right side, and 2+ on the left side.       Posterior tibial pulses are 2+ on the right side, and 2+ on the left side.  LUSB murmur  Respiratory: Effort normal. She has no wheezes. She has no rales.  GI: Soft.  Musculoskeletal: She exhibits no edema.  Neurological: She is alert.  Skin: She is not diaphoretic.    Assessment/Plan: Ms Detter is a pleasant 72 year old female with multiple medical problems including HFpEF. Today she is here with symptoms of weight gain. She does not have significant change in her heart failure symptoms but has had a weight increase. She is not having any anginal symptoms. There are no rales or S3 on exam and her chest x-ray does not show overt pulmonary edema. Taking all of the available data into consideration, Ms Alf will probably benefit from ambulatory management of her current complaints. I will give her an additional dose of IV lasix now. She will take 60mg  lasix PO BID. If her weight stays the same or increases over the next 72 hours she should seek further care. I discussed the management options with Ms Holaday. She was in agreement with our plan and verbalized understanding as to when to seek additional care.  She would probably benefit from the addition of an ACEi or ARB. Right now with her mild acute exacerbation of chronic diastolic heart failure I will not add lisinopril, but it would be worth considering it in the future.    Mild acute exacerbation of chronic heart failure with preserved EF- Will give a dose of 40mg  IV now and increase lasix to 60mg  PO BID.  Can be discharged from ED.  Robyne Peers, MD  Anne Ng C 04/24/2013, 9:09 PM

## 2013-04-24 NOTE — ED Notes (Signed)
Pt in with shortness of breath, history of CHF, states she has been feeling more short of breath than normal x10 days, states she has gained 7 lbs in the last 10 days with no improvement with lasix at home, pt on home 02, denies pain at this time, speaking in full sentences at this time

## 2013-04-27 ENCOUNTER — Telehealth: Payer: Self-pay | Admitting: Physician Assistant

## 2013-04-27 ENCOUNTER — Ambulatory Visit (INDEPENDENT_AMBULATORY_CARE_PROVIDER_SITE_OTHER): Payer: Medicare Other | Admitting: Endocrinology

## 2013-04-27 ENCOUNTER — Encounter: Payer: Self-pay | Admitting: Endocrinology

## 2013-04-27 ENCOUNTER — Telehealth: Payer: Self-pay | Admitting: Pulmonary Disease

## 2013-04-27 VITALS — BP 134/80 | HR 65 | Ht 63.0 in | Wt 219.0 lb

## 2013-04-27 DIAGNOSIS — E1049 Type 1 diabetes mellitus with other diabetic neurological complication: Secondary | ICD-10-CM

## 2013-04-27 MED ORDER — FLUTICASONE-SALMETEROL 250-50 MCG/DOSE IN AEPB: 1.0000 | INHALATION_SPRAY | Freq: Two times a day (BID) | RESPIRATORY_TRACT | Status: DC

## 2013-04-27 MED ORDER — INSULIN GLARGINE 100 UNIT/ML SOLOSTAR PEN: 170.0000 [IU] | PEN_INJECTOR | SUBCUTANEOUS | Status: DC

## 2013-04-27 MED ORDER — GLUCOSE BLOOD VI STRP: 1.0000 | ORAL_STRIP | Freq: Two times a day (BID) | Status: DC

## 2013-04-27 NOTE — Telephone Encounter (Signed)
Samples at front and pt is aware. Carron Curie, CMA

## 2013-04-27 NOTE — Telephone Encounter (Signed)
I spoke with the pt and her weight today is 220 lbs which is up 3 lbs from her ER visit.  The pt states her SOB is worse than what it was a week ago and she has swelling in her abdomen.  The pt is taking Lasix 60mg  twice a day as instructed by the ER.  I will contact the pt tomorrow to arrange an office visit. Pt agreed with plan.

## 2013-04-27 NOTE — Telephone Encounter (Signed)
Pt advised. Jennifer Castillo, CMA  

## 2013-04-27 NOTE — Patient Instructions (Addendum)
check your blood sugar twice a day.  vary the time of day when you check, between before the 3 meals, and at bedtime.  also check if you have symptoms of your blood sugar being too high or too low.  please keep a record of the readings and bring it to your next appointment here.  please call us sooner if your blood sugar goes below 70, or if you have a lot of readings over 200.  Also, please increase the lantus to 170 units daily.   we will need to take this complex situation in stages.   Please come back for a follow-up appointment in 2 weeks.

## 2013-04-27 NOTE — Telephone Encounter (Signed)
I spoke with the pt and she states on Saturday she went to the ER due to increase in SOB, swelling and weight gain, she states she was up 5 lbs. Pt states that they gave her IV lasix and then advised her to increase her lasix to 60 mg bid x 72 hours and if her weight was the same or worse to seek care. The pt states her weight today was 219.8 which is the same as it was on sat at the ER. Pt states that she has some swelling in her abdomen, and feet and ankles. This does not seem much worse, no better. Pt states she tried to call her cardiologists twice today and could not receive an answer.  Please advise. Carron Curie, CMA Allergies  Allergen Reactions  . Aspirin Other (See Comments)    Causes nosebleeds  . Penicillins Itching  . Theophylline Nausea And Vomiting

## 2013-04-27 NOTE — Telephone Encounter (Signed)
Dr Excell Seltzer pt / will forward

## 2013-04-27 NOTE — Telephone Encounter (Signed)
New prob  Pt believes that the lasix is not working.  She would like to speak with a nurse.

## 2013-04-27 NOTE — Progress Notes (Signed)
Subjective:    Patient ID: [name], female    DOB: Apr 03, 1941, 72 y.o.   MRN: [id number]  HPI pt returns for f/u of insulin-requiring DM (dx'ed 2004; she has moderate neuropathy of the lower extremities, and associated CAD; the interpretation of a1c's was limited by blood transfusions in early 2014; she needs a simple insulin regimen).  no cbg record, but states cbg's vary from 275-475.  Past Medical History  Diagnosis Date  . Chronic airway obstruction, not elsewhere classified   . Unspecified chronic bronchitis   . Unspecified essential hypertension   . Coronary atherosclerosis of unspecified type of vessel, native or graft   . Palpitations   . Aortic valve disorders   . Pure hypercholesterolemia   . Morbid obesity   . Esophageal reflux   . Angiodysplasia of intestine (without mention of hemorrhage)   . Diverticulosis of colon (without mention of hemorrhage)   . Benign neoplasm of colon   . Autoimmune hepatitis   . Cystitis, unspecified   . Osteoarthrosis, unspecified whether generalized or localized, unspecified site   . Osteoporosis, unspecified   . Restless legs syndrome (RLS)   . Major depressive disorder, recurrent episode, severe, specified as with psychotic behavior   . Iron deficiency anemia secondary to blood loss (chronic)   . Coarse tremors     "just in my arms"  . Carpal tunnel syndrome on both sides   . Dysrhythmia     palpitations  . CHF (congestive heart failure)   . Asthma   . Shortness of breath     "any time really"  . Sleep apnea     "but I don't use the equipment"  . Type II diabetes mellitus   . Blood transfusion   . H/O hiatal hernia   . 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 Adenomatous colon polyp   . Anginal pain   . Anxiety   . Pneumonia   . Heart murmur   . AVM (arteriovenous malformation)   . Esophageal varices   . Portal hypertensive gastropathy   . Falls frequently   . UTI (lower urinary tract infection)     Past Surgical  History  Procedure Laterality Date  . Hemorroidectomy    . Appendectomy    . Vaginal hysterectomy    . Breast biopsy      bilateral  . Cesarean section      x 3  . Appendectomy    . Esophagogastroduodenoscopy  06/12/2012    Procedure: ESOPHAGOGASTRODUODENOSCOPY (EGD);  Surgeon: Hart Carwin, MD;  Location: [facility];  Service: Endoscopy;  Laterality: N/A;  . Balloon dilation  06/12/2012    Procedure: BALLOON DILATION;  Surgeon: Hart Carwin, MD;  Location: [facility];  Service: Endoscopy;  Laterality: N/A;  ?balloon  . Esophagogastroduodenoscopy  09/10/2012    Procedure: ESOPHAGOGASTRODUODENOSCOPY (EGD);  Surgeon: Hart Carwin, MD;  Location: [facility];  Service: Endoscopy;  Laterality: N/A;  . Hot hemostasis  09/10/2012    Procedure: HOT HEMOSTASIS (ARGON PLASMA COAGULATION/BICAP);  Surgeon: Hart Carwin, MD;  Location: [facility];  Service: Endoscopy;  Laterality: N/A;  . Esophagogastroduodenoscopy N/A 01/18/2013    Procedure: ESOPHAGOGASTRODUODENOSCOPY (EGD);  Surgeon: Hart Carwin, MD;  Location: [facility] END[facility]COPY;  Service: Endoscopy;  Laterality: N/A;    History   Social History  . Marital Status: Married    Spouse Name: jerry Tessler    Number of Children: 4  . Years of Education: 77  Occupational History  . Disabled   .     Social History Main Topics  . Smoking status: Former Smoker -- 1.50 packs/day for 25 years    Types: Cigarettes    Quit date: 08/26/1992  . Smokeless tobacco: Never Used  . Alcohol Use: No     Comment: 11/22/11 "last alcoholic drink was years ago"  . Drug Use: No  . Sexual Activity: No   Other Topics Concern  . Not on file   Social History Narrative   Patient lives at home with her husband [name]).    Retired.   Caffeine- None    Right handed.    Current Outpatient Prescriptions on File Prior to Visit  Medication Sig Dispense Refill  . albuterol (PROAIR HFA) 108 (90 BASE) MCG/ACT inhaler Inhale 2 puffs into the lungs every 6  (six) hours as needed for shortness of breath.       . ALPRAZolam (XANAX) 1 MG tablet Take 1 mg by mouth 2 (two) times daily as needed for anxiety.       . AMBULATORY NON FORMULARY MEDICATION Continuous O2 @@ 3LMP      . atenolol (TENORMIN) 50 MG tablet Take 50 mg by mouth daily.      [name] b complex vitamins tablet Take 1 tablet by mouth daily.       . B-D ULTRAFINE III SHORT PEN 31G X 8 MM MISC USE AS DIRECTED  100 each  3  . buPROPion (WELLBUTRIN XL) 150 MG 24 hr tablet Take 150 mg by mouth daily.      [name] desvenlafaxine (PRISTIQ) 100 MG 24 hr tablet TAKE 150MG  DAILY      . dextromethorphan-guaiFENesin (MUCINEX DM) 30-600 MG per 12 hr tablet Take 1-2 tablets by mouth every 12 (twelve) hours.       [name] lisinopril (PRINIVIL,ZESTRIL) 5 MG tablet Take 5 mg by mouth daily.      . Multiple Vitamins-Minerals (CENTRUM SILVER PO) Take by mouth daily.      . pantoprazole (PROTONIX) 40 MG tablet Take 40 mg by mouth daily.      . polyethylene glycol (MIRALAX / GLYCOLAX) packet Take 17 g by mouth daily as needed (constipation).      . potassium chloride SA (K-DUR,KLOR-CON) 10 MEQ tablet Take 1 tablet (10 mEq total) by mouth every other day.  30 tablet  1  . promethazine (PHENERGAN) 25 MG tablet Take 25 mg by mouth 2 (two) times daily as needed for nausea.      [name] QUEtiapine (SEROQUEL) 100 MG tablet Take 400 mg by mouth at bedtime.       [name] rOPINIRole (REQUIP) 4 MG tablet Take 4 mg by mouth at bedtime.      . Saline (OCEAN NASAL SPRAY NA) Place 1 spray into the nose daily as needed (congestion).      . Simethicone (GAS-X PO) Take 1 tablet by mouth daily. Takes after the biggest meal of the day      . simvastatin (ZOCOR) 40 MG tablet Take 40 mg by mouth at bedtime.      . sucralfate (CARAFATE) 1 GM/10ML suspension Take 10 mL (1 g total) by mouth 2 (two) times daily.  420 mL  1  . Vitamin D, Ergocalciferol, (DRISDOL) 50000 UNITS CAPS capsule Take 50,000 Units by mouth every 7 (seven) days. On fridays       No  current facility-administered medications on file prior to visit.    Allergies  Allergen Reactions  .  Aspirin Other (See Comments)    Causes nosebleeds  . Penicillins Itching  . Theophylline Nausea And Vomiting    Family History  Problem Relation Age of Onset  . Heart disease Mother   . Cervical cancer Mother   . Kidney disease Mother   . Diabetes Mother   . Breast cancer Sister   . Multiple sclerosis Sister   . Colon cancer Neg Hx   . Breast cancer Maternal Aunt     x 2  . Diabetes Father   . Diabetes Sister   . Multiple sclerosis Other     BP 134/80  Pulse 65  Ht 5\' 3"  (1.6 m)  Wt 219 lb (99.338 kg)  BMI 38.8 kg/m2  SpO2 96%  Review of Systems denies hypoglycemia.  She has lost a few lbs.      Objective:   Physical Exam VITAL SIGNS:  See vs page GENERAL: no distress     Assessment & Plan:  DM: she needs increased rx.  This insulin regimen was chosen from multiple options, for its simplicity.  The benefits of glycemic control must be weighed against the risks of hypoglycemia. Although last a1c was good, she appears to now need increased rx. VT: in this setting, we should avoid hypoglycemia.   COPD: this limits exercise rx of DM.

## 2013-04-27 NOTE — Telephone Encounter (Signed)
Per SN---  She will need follow up with cardiology

## 2013-04-28 ENCOUNTER — Encounter: Payer: Self-pay | Admitting: Cardiovascular Disease

## 2013-04-28 ENCOUNTER — Ambulatory Visit (INDEPENDENT_AMBULATORY_CARE_PROVIDER_SITE_OTHER): Payer: Medicare Other | Admitting: Cardiovascular Disease

## 2013-04-28 VITALS — BP 130/70 | HR 64 | Ht 64.0 in | Wt 216.0 lb

## 2013-04-28 DIAGNOSIS — I5033 Acute on chronic diastolic (congestive) heart failure: Secondary | ICD-10-CM

## 2013-04-28 MED ORDER — FUROSEMIDE 20 MG PO TABS: 60.0000 mg | ORAL_TABLET | Freq: Two times a day (BID) | ORAL | Status: DC

## 2013-04-28 NOTE — Telephone Encounter (Signed)
I spoke with the pt and her weight this morning was 220 lbs. I will have the pt come into the office this afternoon to see Dr Excell Seltzer.

## 2013-04-28 NOTE — Patient Instructions (Addendum)
Your physician recommends that you have lab work at upcoming PA appointment: BMP and BNP  Your physician recommends that you keep your scheduled follow-up appointment with Tereso Newcomer PA-C.  Your physician recommends that you continue on your current medications as directed. Please refer to the Current Medication list given to you today.

## 2013-04-28 NOTE — Progress Notes (Signed)
HPI:  72 year old female with a history of diastolic heart failure, morbid obesity, COPD requiring continuous oxygen, mild AS, HTN, HLD, OSA and diabetes. She presents today for follow-up after recently being seen in the Emergency Dept with weight gain and shortness of breath. Lasix was given and she was discharged home.   She continues to complain of abdominal swelling, despite increase in her diuretics. She has chronic shortness of breath and feels this is worse. No chest pain or leg swelling. No lightheadedness or syncope. No cough, fever, or other complaints.   Outpatient Encounter Prescriptions as of 04/28/2013  Medication Sig Dispense Refill  . albuterol (PROAIR HFA) 108 (90 BASE) MCG/ACT inhaler Inhale 2 puffs into the lungs every 6 (six) hours as needed for shortness of breath.       . ALPRAZolam (XANAX) 1 MG tablet Take 1 mg by mouth 2 (two) times daily as needed for anxiety.       . AMBULATORY NON FORMULARY MEDICATION Continuous O2 @@ 3LMP      . atenolol (TENORMIN) 50 MG tablet Take 50 mg by mouth daily.      [name] b complex vitamins tablet Take 1 tablet by mouth daily.       . B-D ULTRAFINE III SHORT PEN 31G X 8 MM MISC USE AS DIRECTED  100 each  3  . buPROPion (WELLBUTRIN XL) 150 MG 24 hr tablet Take 150 mg by mouth daily.      . ciprofloxacin (CIPRO) 250 MG tablet Take 250 mg by mouth 2 (two) times daily. FOR 5 DAYS      . desvenlafaxine (PRISTIQ) 100 MG 24 hr tablet TAKE 150MG  DAILY      . dextromethorphan-guaiFENesin (MUCINEX DM) 30-600 MG per 12 hr tablet Take 1-2 tablets by mouth every 12 (twelve) hours.       . Fluticasone-Salmeterol (ADVAIR DISKUS) 250-50 MCG/DOSE AEPB Inhale 1 puff into the lungs 2 (two) times daily.  28 each  0  . furosemide (LASIX) 20 MG tablet Take 60 mg by mouth daily.      [name] glucose blood (FREESTYLE TEST STRIPS) test strip 1 each by Other route 2 (two) times daily. And lancets 2/day 250.01  100 each  12  . Insulin Glargine (LANTUS SOLOSTAR) 100 UNIT/ML SOPN  Inject 170 Units into the skin every morning. And pen needles 1/day  20 pen  PRN  . lisinopril (PRINIVIL,ZESTRIL) 5 MG tablet Take 5 mg by mouth daily.      . Multiple Vitamins-Minerals (CENTRUM SILVER PO) Take by mouth daily.      . pantoprazole (PROTONIX) 40 MG tablet Take 40 mg by mouth daily.      . polyethylene glycol (MIRALAX / GLYCOLAX) packet Take 17 g by mouth daily as needed (constipation).      . potassium chloride SA (K-DUR,KLOR-CON) 10 MEQ tablet Take 1 tablet (10 mEq total) by mouth every other day.  30 tablet  1  . promethazine (PHENERGAN) 25 MG tablet Take 25 mg by mouth 2 (two) times daily as needed for nausea.      [name] QUEtiapine (SEROQUEL) 100 MG tablet Take 400 mg by mouth at bedtime.       [name] rOPINIRole (REQUIP) 4 MG tablet Take 4 mg by mouth at bedtime.      . Saline (OCEAN NASAL SPRAY NA) Place 1 spray into the nose daily as needed (congestion).      . Simethicone (GAS-X PO) Take 1 tablet by mouth daily. Takes  after the biggest meal of the day      . simvastatin (ZOCOR) 40 MG tablet Take 40 mg by mouth at bedtime.      . sucralfate (CARAFATE) 1 GM/10ML suspension Take 10 mL (1 g total) by mouth 2 (two) times daily.  420 mL  1  . Vitamin D, Ergocalciferol, (DRISDOL) 50000 UNITS CAPS capsule Take 50,000 Units by mouth every 7 (seven) days. On fridays       No facility-administered encounter medications on file as of 04/28/2013.    Allergies  Allergen Reactions  . Aspirin Other (See Comments)    Causes nosebleeds  . Penicillins Itching  . Theophylline Nausea And Vomiting    Past Medical History  Diagnosis Date  . Chronic airway obstruction, not elsewhere classified   . Unspecified chronic bronchitis   . Unspecified essential hypertension   . Coronary atherosclerosis of unspecified type of vessel, native or graft   . Palpitations   . Aortic valve disorders   . Pure hypercholesterolemia   . Morbid obesity   . Esophageal reflux   . Angiodysplasia of intestine (without  mention of hemorrhage)   . Diverticulosis of colon (without mention of hemorrhage)   . Benign neoplasm of colon   . Autoimmune hepatitis   . Cystitis, unspecified   . Osteoarthrosis, unspecified whether generalized or localized, unspecified site   . Osteoporosis, unspecified   . Restless legs syndrome (RLS)   . Major depressive disorder, recurrent episode, severe, specified as with psychotic behavior   . Iron deficiency anemia secondary to blood loss (chronic)   . Coarse tremors     "just in my arms"  . Carpal tunnel syndrome on both sides   . Dysrhythmia     palpitations  . CHF (congestive heart failure)   . Asthma   . Shortness of breath     "any time really"  . Sleep apnea     "but I don't use the equipment"  . Type II diabetes mellitus   . Blood transfusion   . H/O hiatal hernia   . 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 Adenomatous colon polyp   . Anginal pain   . Anxiety   . Pneumonia   . Heart murmur   . AVM (arteriovenous malformation)   . Esophageal varices   . Portal hypertensive gastropathy   . Falls frequently   . UTI (lower urinary tract infection)     ROS: Negative except as per HPI  BP 130/70  Pulse 64  Ht 5\' 4"  (1.626 m)  Wt 216 lb (97.977 kg)  BMI 37.06 kg/m2  SpO2 99%  PHYSICAL EXAM: Pt is alert and oriented, obese woman in NAD HEENT: normal Neck: JVP - normal, carotids 2+= without bruits Lungs: CTA bilaterally CV: RRR with grade 2/6 harsh systolic murmur at the RUSB Abd: soft, NT, Positive BS, no hepatomegaly Ext: no C/C/E, distal pulses intact and equal Skin: warm/dry no rash  2D Echo: Study Conclusions  - Left ventricle: The cavity size was mildly dilated. Wall thickness was normal. Systolic function was normal. The estimated ejection fraction was in the range of 55% to 60%. Wall motion was normal; there were no regional wall motion abnormalities. Features are consistent with a pseudonormal left ventricular filling pattern,  with concomitant abnormal relaxation and increased filling pressure (grade 2 diastolic dysfunction). - Aortic valve: There was very mild stenosis. Mild regurgitation. Valve area: 2.93cm^2(VTI). Valve area: 2.89cm^2 (Vmax). - Mitral valve: Calcified annulus. -  Left atrium: The atrium was mildly dilated. - Right atrium: The atrium was mildly dilated. - Pulmonary arteries: Systolic pressure was mildly increased. PA peak pressure: 39mm Hg (S).  ASSESSMENT AND PLAN: 1. Acute on chronic diastolic heart failure. Primarily related to morbid obesity. I reviewed her weights and there really is no change over the past few years. I did recommend that she remain on the higher dose of furosemide that she is currently taking. She has upcoming follow-up with Tereso Newcomer and we will arrange labs to be drawn at the time of that visit.   2. Aortic stenosis - mild. Continue with clinical follow-up.  3. Obesity - lifestyle modification reviewed.   Tonny Bollman 04/30/2013 6:02 PM

## 2013-04-30 ENCOUNTER — Telehealth: Payer: Self-pay | Admitting: Pulmonary Disease

## 2013-04-30 NOTE — Telephone Encounter (Signed)
Will forward to SN as an FYI 

## 2013-05-04 NOTE — Telephone Encounter (Signed)
Will keep a check to see when these labs come through.  Will sign off of this message.

## 2013-05-05 ENCOUNTER — Encounter: Payer: Self-pay | Admitting: *Deleted

## 2013-05-10 ENCOUNTER — Other Ambulatory Visit (INDEPENDENT_AMBULATORY_CARE_PROVIDER_SITE_OTHER): Payer: Medicare Other

## 2013-05-10 ENCOUNTER — Telehealth: Payer: Self-pay | Admitting: *Deleted

## 2013-05-10 ENCOUNTER — Encounter: Payer: Self-pay | Admitting: Physician Assistant

## 2013-05-10 ENCOUNTER — Ambulatory Visit (INDEPENDENT_AMBULATORY_CARE_PROVIDER_SITE_OTHER): Payer: Medicare Other | Admitting: Physician Assistant

## 2013-05-10 ENCOUNTER — Ambulatory Visit (INDEPENDENT_AMBULATORY_CARE_PROVIDER_SITE_OTHER)
Admission: RE | Admit: 2013-05-10 | Discharge: 2013-05-10 | Disposition: A | Discharge status: Home / Self Care | Payer: Medicare Other | Source: Ambulatory Visit | Attending: Physician Assistant | Admitting: Physician Assistant

## 2013-05-10 VITALS — BP 110/34 | HR 60 | Ht 64.0 in | Wt 218.8 lb

## 2013-05-10 DIAGNOSIS — R0602 Shortness of breath: Secondary | ICD-10-CM

## 2013-05-10 DIAGNOSIS — J449 Chronic obstructive pulmonary disease, unspecified: Secondary | ICD-10-CM

## 2013-05-10 DIAGNOSIS — I5032 Chronic diastolic (congestive) heart failure: Secondary | ICD-10-CM

## 2013-05-10 DIAGNOSIS — I359 Nonrheumatic aortic valve disorder, unspecified: Secondary | ICD-10-CM

## 2013-05-10 DIAGNOSIS — I5033 Acute on chronic diastolic (congestive) heart failure: Secondary | ICD-10-CM

## 2013-05-10 DIAGNOSIS — R05 Cough: Secondary | ICD-10-CM

## 2013-05-10 DIAGNOSIS — I10 Essential (primary) hypertension: Secondary | ICD-10-CM

## 2013-05-10 LAB — BASIC METABOLIC PANEL
BUN: 46 mg/dL — ABNORMAL HIGH (ref 6–23)
Calcium: 8.6 mg/dL (ref 8.4–10.5)
GFR: 39.21 mL/min — ABNORMAL LOW (ref >60.00)
Glucose, Bld: 292 mg/dL — ABNORMAL HIGH (ref 70–99)
Sodium: 133 mEq/L — ABNORMAL LOW (ref 135–145)

## 2013-05-10 MED ORDER — FUROSEMIDE 20 MG PO TABS: 40.0000 mg | ORAL_TABLET | Freq: Two times a day (BID) | ORAL | Status: DC

## 2013-05-10 NOTE — Patient Instructions (Addendum)
PLEASE GO THE [facility] ON [address] TO HAVE BMET, BNP, CHEST X-RAY TODAY  Your physician recommends that you schedule a follow-up appointment in: 4 WEEKS WITH SCOTT WEAVER, PAC SAME DAY DR. Excell Seltzer IS IN THE OFFICE  YOU HAVE A FOLLOW UP WITH TAMMY PARRETT, NP WED 05/12/13 @ 9:45 TO FOLLOW UP ON COUGH

## 2013-05-10 NOTE — Progress Notes (Signed)
[address], [address] [place], [place]  [address] Phone: [phone] Fax:  [phone]  Date:  05/10/2013   ID:  [name], DOB 02-14-1941, MRN [id number]  PCP:  Michele Mcalpine, MD  Cardiologist:  Dr. Tonny Bollman     History of Present Illness: [name] is a 72 y.o. female who returns for follow up.   She has a history of nonobstructive CAD, mild AS, , diastolic CHF, morbid obesity, COPD on continuous O2, HTN, HL, sleep apnea, DM2, anemia with angiodysplasia with chronic GI blood loss.  LHC 11/2007: Dx 20%, proximal and mid RCA 20-30%, EF 50-55%.  Echo 01/2013: EF 55-60%, grade 2 diastolic dysfunction, very mild aortic stenosis, mean gradient 9, mild AI, mild BAE, PASP 39.  Last admitted for acute on chronic diastolic CHF 01/2013.    She was seen by Dr. Excell Seltzer 04/28/13 after a recent visit to the emergency room with weight gain and shortness of breath. Chest x-ray demonstrated edema.  She was given Lasix and discharged home. She continued to complain of abdominal swelling at her visit with Dr. Excell Seltzer. He recommended continuing a higher dose of furosemide until follow up today.  She continues to note worsening dyspnea with exertion. She is NYHA class III. She sleeps on 3 pillows chronically. There has been no change. She denies PND. She denies LE edema. She continues to note increased abdominal girth. She denies chest discomfort. She denies recent syncope. Weights at home have been fairly stable.  Labs (8/14):  K 4.4, creatinine 1.09, pro BNP 147.3, Hgb 9, PLT 79K  Wt Readings from Last 3 Encounters:  05/10/13 218 lb 12.8 oz (99.247 kg)  04/28/13 216 lb (97.977 kg)  04/27/13 219 lb (99.338 kg)     Past Medical History  Diagnosis Date  . Chronic airway obstruction, not elsewhere classified   . Unspecified chronic bronchitis   . Unspecified essential hypertension   . Coronary atherosclerosis of unspecified type of vessel, native or graft   . Palpitations   . Aortic  valve disorders   . Pure hypercholesterolemia   . Morbid obesity   . Esophageal reflux   . Angiodysplasia of intestine (without mention of hemorrhage)   . Diverticulosis of colon (without mention of hemorrhage)   . Benign neoplasm of colon   . Autoimmune hepatitis   . Cystitis, unspecified   . Osteoarthrosis, unspecified whether generalized or localized, unspecified site   . Osteoporosis, unspecified   . Restless legs syndrome (RLS)   . Major depressive disorder, recurrent episode, severe, specified as with psychotic behavior   . Iron deficiency anemia secondary to blood loss (chronic)   . Coarse tremors     "just in my arms"  . Carpal tunnel syndrome on both sides   . Dysrhythmia     palpitations  . CHF (congestive heart failure)   . Asthma   . Shortness of breath     "any time really"  . Sleep apnea     "but I don't use the equipment"  . Type II diabetes mellitus   . Blood transfusion   . H/O hiatal hernia   . 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 Adenomatous colon polyp   . Anginal pain   . Anxiety   . Pneumonia   . Heart murmur   . AVM (arteriovenous malformation)   . Esophageal varices   . Portal hypertensive gastropathy   . Falls frequently   . UTI (lower urinary  tract infection)     Current Outpatient Prescriptions  Medication Sig Dispense Refill  . albuterol (PROAIR HFA) 108 (90 BASE) MCG/ACT inhaler Inhale 2 puffs into the lungs every 6 (six) hours as needed for shortness of breath.       . ALPRAZolam (XANAX) 1 MG tablet Take 1 mg by mouth 2 (two) times daily as needed for anxiety.       . AMBULATORY NON FORMULARY MEDICATION Continuous O2 @@ 3LMP      . atenolol (TENORMIN) 50 MG tablet Take 50 mg by mouth daily.      [name] b complex vitamins tablet Take 1 tablet by mouth daily.       . B-D ULTRAFINE III SHORT PEN 31G X 8 MM MISC USE AS DIRECTED  100 each  3  . buPROPion (WELLBUTRIN XL) 150 MG 24 hr tablet Take 150 mg by mouth daily.      . ciprofloxacin (CIPRO)  250 MG tablet Take 250 mg by mouth 2 (two) times daily. FOR 5 DAYS      . desvenlafaxine (PRISTIQ) 100 MG 24 hr tablet TAKE 150MG  DAILY      . dextromethorphan-guaiFENesin (MUCINEX DM) 30-600 MG per 12 hr tablet Take 1-2 tablets by mouth every 12 (twelve) hours.       . Fluticasone-Salmeterol (ADVAIR DISKUS) 250-50 MCG/DOSE AEPB Inhale 1 puff into the lungs 2 (two) times daily.  28 each  0  . furosemide (LASIX) 20 MG tablet Take 3 tablets (60 mg total) by mouth 2 (two) times daily.  180 tablet  3  . glucose blood (FREESTYLE TEST STRIPS) test strip 1 each by Other route 2 (two) times daily. And lancets 2/day 250.01  100 each  12  . Insulin Glargine (LANTUS SOLOSTAR) 100 UNIT/ML SOPN Inject 170 Units into the skin every morning. And pen needles 1/day  20 pen  PRN  . lisinopril (PRINIVIL,ZESTRIL) 5 MG tablet Take 5 mg by mouth daily.      . Multiple Vitamins-Minerals (CENTRUM SILVER PO) Take by mouth daily.      . pantoprazole (PROTONIX) 40 MG tablet Take 40 mg by mouth daily.      . polyethylene glycol (MIRALAX / GLYCOLAX) packet Take 17 g by mouth daily as needed (constipation).      . potassium chloride SA (K-DUR,KLOR-CON) 10 MEQ tablet Take 1 tablet (10 mEq total) by mouth every other day.  30 tablet  1  . promethazine (PHENERGAN) 25 MG tablet Take 25 mg by mouth 2 (two) times daily as needed for nausea.      [name] QUEtiapine (SEROQUEL) 100 MG tablet Take 400 mg by mouth at bedtime.       [name] rOPINIRole (REQUIP) 4 MG tablet Take 4 mg by mouth at bedtime.      . Saline (OCEAN NASAL SPRAY NA) Place 1 spray into the nose daily as needed (congestion).      . Simethicone (GAS-X PO) Take 1 tablet by mouth daily. Takes after the biggest meal of the day      . simvastatin (ZOCOR) 40 MG tablet Take 40 mg by mouth at bedtime.      . sucralfate (CARAFATE) 1 GM/10ML suspension Take 10 mL (1 g total) by mouth 2 (two) times daily.  420 mL  1  . Vitamin D, Ergocalciferol, (DRISDOL) 50000 UNITS CAPS capsule Take  50,000 Units by mouth every 7 (seven) days. On fridays       No current facility-administered medications for this visit.  Allergies:    Allergies  Allergen Reactions  . Aspirin Other (See Comments)    Causes nosebleeds  . Penicillins Itching  . Theophylline Nausea And Vomiting    Social History:  The patient  reports that she quit smoking about 20 years ago. Her smoking use included Cigarettes. She has a 37.5 pack-year smoking history. She has never used smokeless tobacco. She reports that she does not drink alcohol or use illicit drugs.   ROS:  Please see the history of present illness.   She has occasional bright red blood per rectum. This is a chronic symptom without significant change. She does note increased cough recently with increased sputum production. Sputum is now green. She denies fevers.   All other systems reviewed and negative.   PHYSICAL EXAM: VS:  BP 110/34  Pulse 60  Ht 5\' 4"  (1.626 m)  Wt 218 lb 12.8 oz (99.247 kg)  BMI 37.54 kg/m2 Well nourished, well developed, in no acute distress HEENT: normal Neck: no JVD at 90 Cardiac:  normal S1, S2; RRR; 2/6 systolic murmur at the RUSB Lungs:  clear to auscultation bilaterally, no wheezing, rhonchi or rales Abd: distended, nontender, no hepatomegaly Ext: no edema Skin: warm and dry Neuro:  CNs 2-12 intact, no focal abnormalities noted  EKG:  NSR, HR 60, LAD, nonspecific ST-T wave changes, no change from prior tracing     ASSESSMENT AND PLAN:  1. Dyspnea: Her volume status appears to be stable by exam. Her lungs are clear without wheezing or rales. She does have increased sputum production. She has significant COPD. Continue current dose of Lasix. I will check a followup chest x-ray, basic metabolic panel and BNP today.  I will adjust her diuretics if her BNP is elevated or her chest x-ray continues to demonstrate edema. Given her increased cough with sputum production, I have also recommended sooner follow up with  her pulmonologist. 2. Chronic Diastolic CHF:  Check basic metabolic panel, BNP and chest x-ray as noted. Adjust diuretics as necessary. We discussed the importance of continue to weigh herself daily. 3. Hypertension:  Controlled. 4. CAD:  No angina. Continue statin. 5. Aortic Stenosis:  Very mild by recent echocardiogram. Continue clinical follow up. 6. COPD:  As noted, I will arrange follow up with pulmonology this week. 7. Disposition:  Follow up with me in 4 weeks.   Signed, Tereso Newcomer, PA-C  05/10/2013 9:35 AM

## 2013-05-10 NOTE — Telephone Encounter (Signed)
npt notified about lab and cxr results and to decrease lasix to 40 mg BID, bmet 05/18/13 pt verbalized understanding to Plan of Care

## 2013-05-11 ENCOUNTER — Ambulatory Visit (INDEPENDENT_AMBULATORY_CARE_PROVIDER_SITE_OTHER): Payer: Medicare Other | Admitting: Endocrinology

## 2013-05-11 ENCOUNTER — Other Ambulatory Visit: Payer: Self-pay | Admitting: Pulmonary Disease

## 2013-05-11 ENCOUNTER — Encounter: Payer: Self-pay | Admitting: Endocrinology

## 2013-05-11 VITALS — BP 122/80 | HR 68 | Ht 64.0 in | Wt 219.0 lb

## 2013-05-11 DIAGNOSIS — E119 Type 2 diabetes mellitus without complications: Secondary | ICD-10-CM

## 2013-05-11 MED ORDER — GLUCOSE BLOOD VI STRP: 1.0000 | ORAL_STRIP | Freq: Three times a day (TID) | Status: DC

## 2013-05-11 MED ORDER — INSULIN GLARGINE 100 UNIT/ML SOLOSTAR PEN: 200.0000 [IU] | PEN_INJECTOR | SUBCUTANEOUS | Status: DC

## 2013-05-11 NOTE — Patient Instructions (Addendum)
check your blood sugar twice a day.  vary the time of day when you check, between before the 3 meals, and at bedtime.  also check if you have symptoms of your blood sugar being too high or too low.  please keep a record of the readings and bring it to your next appointment here.  please call us sooner if your blood sugar goes below 70, or if you have a lot of readings over 200.  Also, please increase the lantus to 200 units daily.   we will need to take this complex situation in stages.   Please come back for a follow-up appointment in 1 month.

## 2013-05-11 NOTE — Progress Notes (Signed)
Subjective:    Patient ID: [name], female    DOB: March 16, 1941, 72 y.o.   MRN: [id number]  HPI pt returns for f/u of insulin-requiring DM (dx'ed 2004; she has moderate neuropathy of the lower extremities, and associated CAD; the interpretation of a1c's was limited by blood transfusions in early 2014; she needs a simple insulin regimen).  she brings a record of her cbg's which i have reviewed today.  It varies from 202-400.  There is no trend throughout the day.  pt states she feels well in general. Past Medical History  Diagnosis Date  . Chronic airway obstruction, not elsewhere classified   . Unspecified chronic bronchitis   . Unspecified essential hypertension   . Coronary atherosclerosis of unspecified type of vessel, native or graft   . Palpitations   . Aortic valve disorders   . Pure hypercholesterolemia   . Morbid obesity   . Esophageal reflux   . Angiodysplasia of intestine (without mention of hemorrhage)   . Diverticulosis of colon (without mention of hemorrhage)   . Benign neoplasm of colon   . Autoimmune hepatitis   . Cystitis, unspecified   . Osteoarthrosis, unspecified whether generalized or localized, unspecified site   . Osteoporosis, unspecified   . Restless legs syndrome (RLS)   . Major depressive disorder, recurrent episode, severe, specified as with psychotic behavior   . Iron deficiency anemia secondary to blood loss (chronic)   . Coarse tremors     "just in my arms"  . Carpal tunnel syndrome on both sides   . Dysrhythmia     palpitations  . CHF (congestive heart failure)   . Asthma   . Shortness of breath     "any time really"  . Sleep apnea     "but I don't use the equipment"  . Type II diabetes mellitus   . Blood transfusion   . H/O hiatal hernia   . 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 Adenomatous colon polyp   . Anginal pain   . Anxiety   . Pneumonia   . Heart murmur   . AVM (arteriovenous malformation)   . Esophageal varices   . Portal  hypertensive gastropathy   . Falls frequently   . UTI (lower urinary tract infection)     Past Surgical History  Procedure Laterality Date  . Hemorroidectomy    . Appendectomy    . Vaginal hysterectomy    . Breast biopsy      bilateral  . Cesarean section      x 3  . Appendectomy    . Esophagogastroduodenoscopy  06/12/2012    Procedure: ESOPHAGOGASTRODUODENOSCOPY (EGD);  Surgeon: Hart Carwin, MD;  Location: [facility];  Service: Endoscopy;  Laterality: N/A;  . Balloon dilation  06/12/2012    Procedure: BALLOON DILATION;  Surgeon: Hart Carwin, MD;  Location: [facility];  Service: Endoscopy;  Laterality: N/A;  ?balloon  . Esophagogastroduodenoscopy  09/10/2012    Procedure: ESOPHAGOGASTRODUODENOSCOPY (EGD);  Surgeon: Hart Carwin, MD;  Location: [facility];  Service: Endoscopy;  Laterality: N/A;  . Hot hemostasis  09/10/2012    Procedure: HOT HEMOSTASIS (ARGON PLASMA COAGULATION/BICAP);  Surgeon: Hart Carwin, MD;  Location: [facility];  Service: Endoscopy;  Laterality: N/A;  . Esophagogastroduodenoscopy N/A 01/18/2013    Procedure: ESOPHAGOGASTRODUODENOSCOPY (EGD);  Surgeon: Hart Carwin, MD;  Location: [facility];  Service: Endoscopy;  Laterality: N/A;    History   Social History  . Marital Status:  Married    Spouse Name: jerry Dutt    Number of Children: 4  . Years of Education: 12   Occupational History  . Disabled   .     Social History Main Topics  . Smoking status: Former Smoker -- 1.50 packs/day for 25 years    Types: Cigarettes    Quit date: 08/26/1992  . Smokeless tobacco: Never Used  . Alcohol Use: No     Comment: 11/22/11 "last alcoholic drink was years ago"  . Drug Use: No  . Sexual Activity: No   Other Topics Concern  . Not on file   Social History Narrative   Patient lives at home with her husband [name]).    Retired.   Caffeine- None    Right handed.    Current Outpatient Prescriptions on File Prior to Visit  Medication Sig  Dispense Refill  . albuterol (PROAIR HFA) 108 (90 BASE) MCG/ACT inhaler Inhale 2 puffs into the lungs every 6 (six) hours as needed for shortness of breath.       . ALPRAZolam (XANAX) 1 MG tablet Take 1 mg by mouth 2 (two) times daily as needed for anxiety.       . AMBULATORY NON FORMULARY MEDICATION Continuous O2 @@ 3LMP      . atenolol (TENORMIN) 50 MG tablet Take 50 mg by mouth daily.      [name] b complex vitamins tablet Take 1 tablet by mouth daily.       . B-D ULTRAFINE III SHORT PEN 31G X 8 MM MISC USE AS DIRECTED  100 each  3  . buPROPion (WELLBUTRIN XL) 150 MG 24 hr tablet Take 150 mg by mouth daily.      [name] desvenlafaxine (PRISTIQ) 100 MG 24 hr tablet TAKE 150MG  DAILY      . dextromethorphan-guaiFENesin (MUCINEX DM) 30-600 MG per 12 hr tablet Take 1-2 tablets by mouth every 12 (twelve) hours.       . Fluticasone-Salmeterol (ADVAIR DISKUS) 250-50 MCG/DOSE AEPB Inhale 1 puff into the lungs 2 (two) times daily.  28 each  0  . furosemide (LASIX) 20 MG tablet Take 2 tablets (40 mg total) by mouth 2 (two) times daily.      [name] lisinopril (PRINIVIL,ZESTRIL) 5 MG tablet Take 5 mg by mouth daily.      . Multiple Vitamins-Minerals (CENTRUM SILVER PO) Take by mouth daily.      . pantoprazole (PROTONIX) 40 MG tablet Take 40 mg by mouth daily.      . polyethylene glycol (MIRALAX / GLYCOLAX) packet Take 17 g by mouth daily as needed (constipation).      . potassium chloride SA (K-DUR,KLOR-CON) 10 MEQ tablet Take 1 tablet (10 mEq total) by mouth every other day.  30 tablet  1  . promethazine (PHENERGAN) 25 MG tablet Take 25 mg by mouth 2 (two) times daily as needed for nausea.      [name] QUEtiapine (SEROQUEL) 100 MG tablet Take 400 mg by mouth at bedtime.       [name] rOPINIRole (REQUIP) 4 MG tablet Take 4 mg by mouth at bedtime.      . Saline (OCEAN NASAL SPRAY NA) Place 1 spray into the nose daily as needed (congestion).      . Simethicone (GAS-X PO) Take 1 tablet by mouth daily. Takes after the biggest meal of the  day      . simvastatin (ZOCOR) 40 MG tablet Take 40 mg by mouth at bedtime.      [name]  sucralfate (CARAFATE) 1 GM/10ML suspension Take 10 mL (1 g total) by mouth 2 (two) times daily.  420 mL  1  . Vitamin D, Ergocalciferol, (DRISDOL) 50000 UNITS CAPS capsule Take 50,000 Units by mouth every 7 (seven) days. On fridays       No current facility-administered medications on file prior to visit.    Allergies  Allergen Reactions  . Aspirin Other (See Comments)    Causes nosebleeds  . Penicillins Itching  . Theophylline Nausea And Vomiting   Family History  Problem Relation Age of Onset  . Heart disease Mother   . Cervical cancer Mother   . Kidney disease Mother   . Diabetes Mother   . Breast cancer Sister   . Multiple sclerosis Sister   . Colon cancer Neg Hx   . Breast cancer Maternal Aunt     x 2  . Diabetes Father   . Diabetes Sister   . Multiple sclerosis Other    BP 122/80  Pulse 68  Ht 5\' 4"  (1.626 m)  Wt 219 lb (99.338 kg)  BMI 37.57 kg/m2  SpO2 92%  Review of Systems She has gained a few lbs.  denies hypoglycemia.     Objective:   Physical Exam VITAL SIGNS:  See vs page GENERAL: no distress  (pt says her a1c by [facility] nurse was 8.8%).      Assessment & Plan:  DM: improved, but she needs increased rx.  This insulin regimen was chosen from multiple options, for its simplicity.  The benefits of glycemic control must be weighed against the risks of hypoglycemia.   CAD: in this setting, pt should avoid hypoglycemia.  COPD: this limits exercise rx of DM.

## 2013-05-12 ENCOUNTER — Encounter: Payer: Self-pay | Admitting: Adult Health

## 2013-05-12 ENCOUNTER — Ambulatory Visit (INDEPENDENT_AMBULATORY_CARE_PROVIDER_SITE_OTHER): Payer: Medicare Other | Admitting: Adult Health

## 2013-05-12 VITALS — BP 114/66 | HR 63 | Temp 98.0°F | Ht 64.0 in | Wt 219.2 lb

## 2013-05-12 DIAGNOSIS — J449 Chronic obstructive pulmonary disease, unspecified: Secondary | ICD-10-CM

## 2013-05-12 MED ORDER — DOXYCYCLINE HYCLATE 100 MG PO TABS: 100.0000 mg | ORAL_TABLET | Freq: Two times a day (BID) | ORAL | Status: DC

## 2013-05-12 NOTE — Assessment & Plan Note (Signed)
Flare   Plan  Doxycycline 100mg  Twice daily  For 7days  Mucinex DM twice daily as needed. For cough and congestion. Followup with Dr. Kriste Basque in 2 weeks as planned and as needed. Please contact office for sooner follow up if symptoms do not improve or worsen or seek emergency care

## 2013-05-12 NOTE — Patient Instructions (Addendum)
Doxycycline 100mg  Twice daily  For 7days  Mucinex DM twice daily as needed. For cough and congestion. Followup with Dr. Kriste Basque in 2 weeks as planned and as needed. Please contact office for sooner follow up if symptoms do not improve or worsen or seek emergency care

## 2013-05-12 NOTE — Progress Notes (Addendum)
Subjective:    Patient ID: [name], female    DOB: [birth year], 72 y.o.   MRN: [id number]  HPI 72 y/o WF   05/12/2013 Acute OV  Complains of  increased SOB, prod cough with yellow/green mucus, tightness in chest, weakness x1 week. Has a lot of nasal rinses and drainage in throat .  Mucinex is not helping .  No fever, chest pain , orthopnea, leg swelling or hemoptysis.  Cough is getting worse for last 2 days.  Was seen by cardiology yesterday ,cXR with no acute changes . BNP normal .            Problem List:  COPD (ICD-496) & BRONCHITIS, RECURRENT (ICD-491.9) - Ex-smoker w/ underlying COPD on home O2, ADVAIR 250Bid, ALBUTEROL (via nebs or MDI for Prn use), & MUCINEX 1-2Bid... she has combined obstructive AND restrictive disease (based on her obesity) w/ diet (weight reduction) + exercise strongly recommended to the pt...  ~  CTChest 5/06 = neg. ~  baseline CXR w/ bilat LL scarring, NAD.... last CXR 3/09 = unchanged, NAD. ~  PFTs 3/09 showed FVC= 2.47 (83%), FEV1= 1.65 (69%), %1sec= 67, mid-flows= 28%pred. ~  CXR 4/12 showed bilat scarring, NAD... ~  PFT 10/12 showed FVC= 2.28 (77%), FEV1= 1.46 (64%), %1sec= 64, mid-flows= 31% pred. ~  CXR 3/13 showed normal heart size, prom hilar regions, no infiltrates etc, NAD.[name]. ~  CTAngio Chest 3/13 showed neg for PE- scarring at lung bases, no adenopathy or lung lesions, calcified coronaries, multilevel spondylosis in TSpine, cirrhosis in liver  HYPERTENSION (ICD-401.9) - on ATENOLOL 50mg /d,  LASIX 40mg /d & K20/d...  ~  10/11:  BP=116/78 and she has mult somatic complaints but all are diminished compared to prev visits; denies bad HA's, CP, palpit, syncope, edema, etc... ~  4/12:  BP= 140/60 and she persists w/ mult somatic complaints... ~  10/12:  BP= 122/74 & she has mult somatic complaints; needs better low sodium, calorie restricted diet & wt reduction... ~  3/13:  BP= 120/72 & she is unchanged symptomatically;  EKG 3/13 showed NSR,  rate68, LAD, late transition, NSSTTWA... ~  7/13:  BP= 110/62 & she persists w/ mult somatic complaints... ~  10/13:  BP= 118/60 & she remains clinically stable...  CAD (ICD-414.00) - Hosp at [facility] 4/09 w/ CP... cath showed non-obstructive CAD... no CP, palpit, etc (but she is way too sedentary). ~  cath 4/09 by DrCooper showed normLMAIN, 20% midLAD, norm CIRC, 20-30% proxRCA, EF= 50-55%... ~  EKG 1/11 showed SBrady, rate54, ?septal infarct, NSSTTWA, NAD... ~  EKG 3/13 showed NSR, rate68, LAD, late transition, NSSTTWA... ~  CTAngio Chest 3/13 showed calcif coronaries as noted... ~  DrCooper 7/13> stable, no angina, too sedentary; f/u 2DEcho w/ mild LVH, norm LVF w/ EF=55-60% & norm wall motion, mils AS & AI, Gr2DD.Marland KitchenMarland Kitchen  PALPITATIONS (ICD-785.1) - eval by DrKlein w/ 7 beats WT on monitor... he changed Lisinopril to BBlocker (currently ATENOLOL 50mg /d) and improved... ~  she saw DrCopper for Cards f/u 1/11- palpit well controlled on Atenolol; hx non-obstructive CAD & good LVF; no change in Rx.  AORTIC STENOSIS (ICD-424.1) - she has mild Ao Valve disease w/ AS/ AI... followed by DrCooper. ~  2DEcho 11/06 showed mild ca++ AoV w/ mild AS, norm LVF w/ EF=55-65% ~  repeat 2DEcho 1/09 showed similar mild ca++ AoV w/ mild AS, mild AI, no regional wall motion abn, norm LVf w/ EF= 60%... NOTE: est PAsys= 35... ~  repeat 2DEcho 1/10 showed  norm LVF w/ EF= 55-60%, no regional wall motion abn, mild DD, mod calcif AoV & mild reduced leaflet excursion & mild AI... ~  Repeat 2DEcho 7/13 showed norm LVF w/ EF=55-65%, no regional wall motion abn, Gr2DD, mildAS, mildAI, mild LAdil... ~  DrCooper 7/13> stable, no angina, too sedentary; f/u 2DEcho w/ mild LVH, norm LVF w/ EF=55-60% & norm wall motion, mils AS & AI, Gr2DD...  HYPERCHOLESTEROLEMIA (ICD-272.0) - on SIMVASTATIN 40mg /d... ~  FLP 6/09 showed TChol 107, TG 69, HDL 35, LDL 59 ~  FLP 3/10 showed TChol 128, TG 125, HDL 33, LDL 70 ~  FLP 12/10 showed TChol  98, TG 64, HDL 37, LDL 48... continue same. ~  FLP 10/11 showed TChol 124, TG 105, HDL 37, LDL 66 ~  FLP 4/12 showed TChol 122, TG 95, HDL 37, LDL 66 ~  FLP 10/12 on Simva40 showed TChol 110, TG 116, HDL 34, LDL 53 ~  FLP 5/13 on Simva40 showed TChol 94, TG 130, HDL 32, LDL 36  DM (ICD-250.00) - hx irreg control, not really on diet... on LANTUS 25u daily,  METFORMIN 500mg -2Bid,  GLIMEPIRIDE 4mg /d,  JANUVIA 100mg /d...  ~  labs 10/08 BS=198, HgA1C=7.2 ~  labs 1/09 showed BS=119, HgA1c=6.0 ~  labs 6/09 showed BS= 248, HgA1c= 7.0..[name] discussed need for starting insulin if she can't get on track and get wt down... ~  labs 9/09 showed BS= 236, HgA1c= 7.6.Marland KitchenMarland Kitchen rec- start Lantus 5u/d... grad increased to 10u. ~  labs 11/09 showed BS= 166, HgA1c= 6.0.Marland KitchenMarland Kitchen rec- great! keep same. ~  labs 3/10 showed BS= 169, A1c= 6.8 ~  labs 12/10 showed BS= 149, A1c= 5.8, Umicroalb= neg. ~  labs 10/11 showed BS= 215, A1c= 7.1.[name]. rec> better diet, get wt down. ~  Labs 4/12 showed BS= 172, A1c=5.8 ~  Labs 10/12 showed BS= 245, A1c= 8.1.Marland KitchenMarland Kitchen rec to take meds every day; incr Lantus dose 10==> 20u/d... ~  Labs 3/13 on Lantus20+33meds showed BS= 276, A1c= 6.8 (improved)... ~  4/13:  No diabetic retinopathy per optometrist at [facility]... ~  Labs 5/13 on Lantus20+34meds showed BS= 191, A1c= 6.9 ==> subsequently BS incr & Lantus incr ~  Labs 7/13 on Lantus40+106meds showed BS= 339, A1c= 7.4.Marland KitchenMarland Kitchen ?why control is off, rec move shots around & incr by 5u/wk til BS<200. ~  Labs 10/13 on Lantus25+87meds showed BS= 164, A1c= 5.0.Marland KitchenMarland Kitchen Too tight & rec to decr Lantus20 & decr Glimep4mg  to 1/2 tab...  MORBID OBESITY (ICD-278.01) - not really on diet or exercise program... ~  weight 6/09 = 223# ~  weight 11/09 = 228# ~  weight 3/10 = 225# ~  weight 6/10 = 217# ~  weight 9/10 = 213# ~  weight 12/10 = 224# ~  weight 2/11 = 216# ~  weight 10/11 = 230# ~  Weight 4/12 = 222# ~  Weight 10/12 = 225# ~  Weight 3/13 = 215#...  great job, keep it  up... 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  ~  EGD  2/06 showed angiodysplasia & mult gastric pseudopolyps, watermelon stomach improved from 2003. ~  recent f/u EGD 9/09 showed chr gastritis, no watermelon stomach & improved from prev... 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 ~  5/13:  She is not on PPI and c/o some abd discomfort- rec starting PROTONIX 40mg /d... ~  She saw DrDBrodie 10/13 w/ c/o dysphagia + f/u of her autoimmune liver dis, portal HTN, cirrhosis, "watermelon stomach" & hx argon laser ablation of gastric AVMs (chr GI blood loss w/ periodic Fe infusions- last 8/13); EGD 10/13 showed norm esoph mucosa, no varicies, spasm at the LES but no stricture found; dilated to 69F; mod severe portal hypertensive gastropathy in stomach, no active bleeding; REC> continue PPI, try Levsin...  DIVERTICULOSIS OF COLON (ICD-562.10) & POLYP, COLON (ICD-211.3) -  ~  last colonoscopy 10/08 w/ adenomatous polyp removed, and angiodysplasia without hemorrhage ~  CT Abd 10/08 showed Cirrhosis, borderline splenomeg, sl pancreatic atrophy & duct dil, sm umbil hernia, DJD sp, NAD... ~  10/13: she had f/u drDBrodie> last colonoscopy was 2008, she felt pt was too high risk for prep & sedation for a f/u colonoscopy & they decided to hold off...  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 ~  10/11:  presents w/ dysuria & UTI Rx w/ Cipro... ~  12/11:  She had f/u DrGrapey...  OSTEOARTHRITIS (ICD-715.90)  FIBROMYALGIA (ICD-729.1) - she persists w/ mult somatic complaints...  ? of OSTEOPOROSIS (ICD-733.00) - she had normal BMD in 2001 at [place]..  RESTLESS LEGS SYNDROME (ICD-333.94) - she prev weaned off the Requip & remains on SINEMET-CR 25-100Bid... ~  9/10: now c/o recurrent restless leg symptoms... restart REQUIP 2mg  & titrate up. ~  12/10: persistant symptoms on 2mg Qhs... rec to incr to 4mg ... ~  2/11: she reports resolution of RLS on 4mg  Requip Qhs, resting well now. ~  12/11:  Recurrent RLS symptoms despite the Requip & seen by DrDohmeier w/ addition of Sinemet 25/100 Bid... ~  2012:  Stable on Requip4mg  & Sinemet 25/100 Bid... ~  7/13:  She is once again c/o incr RLS symptoms & rec to restart REQUIP 4mg /d...  ~  8/13: she had f/u visit w/ DrDohmeier> RLS, tremors, & insomnia; Seroquel helped for awhile, she is working w/ drHejazi to help her problems...  DYSTHYMIA (ICD-300.4) - Hx depression w/ psychotic features and hosp 2/09 at [facility] w/ delusions of pedunculosis> she was prev treated by Dr. Bartholomew Crews & DrSuttenfield for counselling...  Adm to [facility] in May09 by DrBarbSmith w/ depression and meds adjusted (see DC summary)...  Now followed by [name] and [name] at [facility]- she was re-admitted 10/09 and meds changed to RISPERDAL, PRISTIQUE, BUPROPION, SEROQUEL, XANAX & Nuvigil (Armodafinil)...  husb indicates that she is much better at present & continues outpt  Rx... ~  MRI/ MRA Brain 2/08 showed sm vessel dis & atherosclerotic changes in post circ... ~  9/10:  pt indicates that she is off the Trazodone... DrHejazi added Wellbutrin ?for her RLS? ~  2011:  pt continues regular f/u w/ Psychiatry on numerous meds w/ freq med adjustments; she is requested to get notes from [facility] to our attention & bring all med bottles to her OVs for review... ~  2012:  We have not received any notes from [facility]; pt has not complied w/ our request for med bottles to be brought to each OV... ~  2013:  We still don't have accurate list of her current meds as we have not received [facility] notes & she NEVER brings meds to office despite our requests...  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 ~  labs 04/18/08 by DrBrodie showed Hg= 10.4, Fe= 52... she ordered 1,200mg  IV Iron- given 04/26/08. ~  labs 05/19/08 showed Hg= 11.3, Fe= 117... ~  labs 11/09 showed Hg= 12.0.Marland KitchenMarland Kitchen ~  labs 3/10 showed Hg= 12.3, Fe= 79 ~  labs 6/10 showed Hg= 11.5, Fe= 73 ~  labs 12/10 showed Hg= 7.2, Fe= 28 (6%sat)... given [facility] & IV Fe per DrBrodie in Jan2012. ~  labs 2/11 showed Hg= 11.8, Fe= 95... pt indicates that DrBrodie will be following labs going forward... ~  labs 6/11 showed Hg= 11.2 ~  labs 9/11 showed Hg= 11.1, Fe= 106 ~  Labs 4/12 showed Hg=11.0, Fe=118 ~  Labs 10/12 showed Hg=9.5, Fe=35 (7%sat)... We will arrange for outpt infusion 1000mg  Fe... ~  Labs 12/12 showed Hg=11.4, Fe=92 (26%sat) ~  Labs 3/13 showed Hg=11.2, Fe=71 (17%sat) ~  Labs 5/13 showed Hg=9.8, Fe=53 (13%sat) ~  04/22/12:  She was given 1000mg  IV Iron Dextran by DrDBrodie... ~  Labs 10/13 showed Hg=11.1, Fe=76 (23%) ~  Labs 1/14 showed Hg=7.0 (down 4pts) Hct=21.9 Fe=51 (11%sat) B12=623, Protime=11.6/ 1.1,  AFP=pending; Abd sonar per DrDBrodie- pending...     Past Surgical History  Procedure Laterality Date  . Hemorroidectomy    . Appendectomy    . Vaginal hysterectomy    . Breast biopsy      bilateral  . Cesarean section      x 3  . Appendectomy    . Esophagogastroduodenoscopy  06/12/2012    Procedure: ESOPHAGOGASTRODUODENOSCOPY (EGD);  Surgeon: Hart Carwin, MD;  Location: [facility];  Service: Endoscopy;  Laterality: N/A;  . Balloon dilation  06/12/2012    Procedure: BALLOON DILATION;  Surgeon: Hart Carwin, MD;  Location: [facility];  Service: Endoscopy;  Laterality: N/A;  ?balloon  . Esophagogastroduodenoscopy  09/10/2012    Procedure: ESOPHAGOGASTRODUODENOSCOPY (EGD);  Surgeon: Hart Carwin, MD;  Location: [facility];  Service: Endoscopy;  Laterality: N/A;  . Hot hemostasis  09/10/2012    Procedure: HOT HEMOSTASIS (ARGON PLASMA COAGULATION/BICAP);  Surgeon: Hart Carwin, MD;  Location: [facility];  Service: Endoscopy;  Laterality: N/A;  . Esophagogastroduodenoscopy N/A 01/18/2013    Procedure: ESOPHAGOGASTRODUODENOSCOPY (EGD);  Surgeon: Hart Carwin, MD;  Location: [facility] ENDOSCOPY;  Service: Endoscopy;  Laterality: N/A;    Outpatient Encounter Prescriptions as of 05/12/2013  Medication Sig Dispense Refill  . albuterol (PROAIR HFA) 108 (90 BASE) MCG/ACT inhaler Inhale 2 puffs into the lungs every 6 (six) hours as needed for shortness of breath.       . ALPRAZolam (XANAX) 1 MG tablet Take 1 mg by mouth 2 (  two) times daily as needed for anxiety.       . AMBULATORY NON FORMULARY MEDICATION Continuous O2 @@ 3LMP      . atenolol (TENORMIN) 50 MG tablet Take 50 mg by mouth daily.      [name] b complex vitamins tablet Take 1 tablet by mouth daily.       . B-D ULTRAFINE III SHORT PEN 31G X 8 MM MISC USE AS DIRECTED  100 each  3  . buPROPion (WELLBUTRIN XL) 150 MG 24 hr tablet Take 150 mg by mouth daily.      [name] desvenlafaxine (PRISTIQ) 100 MG 24 hr tablet TAKE 150MG  DAILY      .  dextromethorphan-guaiFENesin (MUCINEX DM) 30-600 MG per 12 hr tablet Take 1-2 tablets by mouth every 12 (twelve) hours.       . Fluticasone-Salmeterol (ADVAIR DISKUS) 250-50 MCG/DOSE AEPB Inhale 1 puff into the lungs 2 (two) times daily.  28 each  0  . furosemide (LASIX) 20 MG tablet Take 2 tablets (40 mg total) by mouth 2 (two) times daily.      [name] glucose blood (FREESTYLE LITE) test strip 1 each by Other route 3 (three) times daily. And lancets 3/day 250.01  50 each  6  . Insulin Glargine 100 UNIT/ML SOPN Inject 200 Units into the skin every morning. And pen needles 1/day  25 pen  11  . lisinopril (PRINIVIL,ZESTRIL) 5 MG tablet Take 5 mg by mouth daily.      . Multiple Vitamins-Minerals (CENTRUM SILVER PO) Take by mouth daily.      . pantoprazole (PROTONIX) 40 MG tablet Take 40 mg by mouth daily.      . polyethylene glycol (MIRALAX / GLYCOLAX) packet Take 17 g by mouth daily as needed (constipation).      . potassium chloride SA (K-DUR,KLOR-CON) 10 MEQ tablet Take 1 tablet (10 mEq total) by mouth every other day.  30 tablet  1  . promethazine (PHENERGAN) 25 MG tablet Take 25 mg by mouth 2 (two) times daily as needed for nausea.      [name] QUEtiapine (SEROQUEL) 100 MG tablet Take 400 mg by mouth at bedtime.       [name] rOPINIRole (REQUIP) 4 MG tablet Take 4 mg by mouth at bedtime.      . Saline (OCEAN NASAL SPRAY NA) Place 1 spray into the nose daily as needed (congestion).      . Simethicone (GAS-X PO) Take 1 tablet by mouth daily. Takes after the biggest meal of the day      . simvastatin (ZOCOR) 40 MG tablet Take 40 mg by mouth at bedtime.      . sucralfate (CARAFATE) 1 GM/10ML suspension Take 10 mL (1 g total) by mouth 2 (two) times daily.  420 mL  1  . Vitamin D, Ergocalciferol, (DRISDOL) 50000 UNITS CAPS capsule Take 50,000 Units by mouth every 7 (seven) days. On fridays       No facility-administered encounter medications on file as of 05/12/2013.    Allergies  Allergen Reactions  . Aspirin  Other (See Comments)    Causes nosebleeds  . Penicillins Itching  . Theophylline Nausea And Vomiting    Current Medications, Allergies, Past Medical History, Past Surgical History, Family History, and Social History were reviewed in [employer] record.    Review of Systems        Constitutional:   No  weight loss, night sweats,  Fevers, chills,  +fatigue, or  lassitude.  HEENT:   No headaches,  Difficulty swallowing,  Tooth/dental problems, or  Sore throat,                No sneezing, itching, ear ache,  +nasal congestion, post nasal drip,   CV:  No chest pain,  Orthopnea, PND, swelling in lower extremities, anasarca, dizziness, palpitations, syncope.   GI  No heartburn, indigestion, abdominal pain, nausea, vomiting, diarrhea, change in bowel habits, loss of appetite, bloody stools.   Resp:  No coughing up of blood.  No change in color of mucus.  No wheezing.  No chest wall deformity  Skin: no rash or lesions.  GU: no dysuria, change in color of urine, no urgency or frequency.  No flank pain, no hematuria   MS:  No joint pain or swelling.  No decreased range of motion.  No back pain.  Psych:  No change in mood or affect. No depression or anxiety.  No memory loss.          Objective:   Physical Exam     WD, Obese, 72 y/o WF in NAD... she is chr ill appearing... GENERAL:  Alert & oriented x 3... HEENT:  [place]/AT, pale conjuct, EOM-wnl, PERRLA, EACs-clear, TMs-wnl, NOSE-clear, THROAT-clear w/ dry MMs... NECK:  Supple w/ fairROM; no JVD; normal carotid impulses w/o bruits; no thyromegaly or nodules palpated; no lymphadenopathy. CHEST:  Clear to P & A; without wheezes/ rales/ or rhonchi heard... HEART:  Regular Rhythm;  gr 1/6 SEM without rubs or gallops... ABDOMEN:  Obese, soft & nontender w/ panniculus; normal bowel sounds; no organomegaly or masses detected. EXT: without deformities, mod arthritic changes; no varicose veins/ +venous insuffic/ tr  edema. NEURO:  ;  no focal neuro deficits... DERM:  No lesions noted; no rash, pale complexion.

## 2013-05-14 ENCOUNTER — Other Ambulatory Visit (HOSPITAL_COMMUNITY): Payer: Medicare Other

## 2013-05-14 ENCOUNTER — Other Ambulatory Visit: Payer: Self-pay | Admitting: Pulmonary Disease

## 2013-05-17 ENCOUNTER — Telehealth: Payer: Self-pay | Admitting: Pulmonary Disease

## 2013-05-17 ENCOUNTER — Other Ambulatory Visit (INDEPENDENT_AMBULATORY_CARE_PROVIDER_SITE_OTHER): Payer: Medicare Other

## 2013-05-17 ENCOUNTER — Other Ambulatory Visit: Payer: Self-pay | Admitting: Pulmonary Disease

## 2013-05-17 DIAGNOSIS — J449 Chronic obstructive pulmonary disease, unspecified: Secondary | ICD-10-CM

## 2013-05-17 DIAGNOSIS — I5033 Acute on chronic diastolic (congestive) heart failure: Secondary | ICD-10-CM

## 2013-05-17 LAB — BASIC METABOLIC PANEL
BUN: 41 mg/dL — ABNORMAL HIGH (ref 6–23)
Calcium: 8.7 mg/dL (ref 8.4–10.5)
Creatinine, Ser: 1.5 mg/dL — ABNORMAL HIGH (ref 0.4–1.2)
GFR: 37.96 mL/min — ABNORMAL LOW (ref >60.00)

## 2013-05-17 NOTE — Telephone Encounter (Signed)
I spoke with [name]. She stated pt needs humidifier for her O2. I advised will place order. Staff message has been sent to [facility] as well. Nothing further needed

## 2013-05-18 ENCOUNTER — Telehealth: Payer: Self-pay | Admitting: Nurse Practitioner

## 2013-05-18 DIAGNOSIS — I10 Essential (primary) hypertension: Secondary | ICD-10-CM

## 2013-05-18 NOTE — Telephone Encounter (Signed)
Message copied by Levi Aland on Tue May 18, 2013  1:55 PM ------      Message from: [place], [place] T      Created: Tue May 18, 2013  1:46 PM       Renal fxn stable      K+ ok      Continue with current treatment plan.      Repeat BMET again in 2 weeks      Tereso Newcomer, PA-C        05/18/2013 1:46 PM ------

## 2013-05-22 ENCOUNTER — Inpatient Hospital Stay (HOSPITAL_COMMUNITY)
Admission: EM | Admit: 2013-05-22 | Discharge: 2013-05-26 | DRG: 378 | Disposition: A | Discharge status: Home / Self Care | Payer: Medicare Other | Attending: Internal Medicine | Admitting: Internal Medicine

## 2013-05-22 ENCOUNTER — Encounter (HOSPITAL_COMMUNITY): Payer: Self-pay | Admitting: Emergency Medicine

## 2013-05-22 ENCOUNTER — Emergency Department (HOSPITAL_COMMUNITY): Payer: Medicare Other

## 2013-05-22 DIAGNOSIS — I472 Ventricular tachycardia, unspecified: Secondary | ICD-10-CM

## 2013-05-22 DIAGNOSIS — N182 Chronic kidney disease, stage 2 (mild): Secondary | ICD-10-CM | POA: Diagnosis present

## 2013-05-22 DIAGNOSIS — K5900 Constipation, unspecified: Secondary | ICD-10-CM | POA: Diagnosis present

## 2013-05-22 DIAGNOSIS — R002 Palpitations: Secondary | ICD-10-CM

## 2013-05-22 DIAGNOSIS — G2581 Restless legs syndrome: Secondary | ICD-10-CM

## 2013-05-22 DIAGNOSIS — R296 Repeated falls: Secondary | ICD-10-CM

## 2013-05-22 DIAGNOSIS — K219 Gastro-esophageal reflux disease without esophagitis: Secondary | ICD-10-CM

## 2013-05-22 DIAGNOSIS — I10 Essential (primary) hypertension: Secondary | ICD-10-CM

## 2013-05-22 DIAGNOSIS — R1310 Dysphagia, unspecified: Secondary | ICD-10-CM

## 2013-05-22 DIAGNOSIS — D126 Benign neoplasm of colon, unspecified: Secondary | ICD-10-CM

## 2013-05-22 DIAGNOSIS — I5030 Unspecified diastolic (congestive) heart failure: Secondary | ICD-10-CM | POA: Diagnosis present

## 2013-05-22 DIAGNOSIS — Z87891 Personal history of nicotine dependence: Secondary | ICD-10-CM

## 2013-05-22 DIAGNOSIS — I8500 Esophageal varices without bleeding: Secondary | ICD-10-CM

## 2013-05-22 DIAGNOSIS — J441 Chronic obstructive pulmonary disease with (acute) exacerbation: Secondary | ICD-10-CM

## 2013-05-22 DIAGNOSIS — D649 Anemia, unspecified: Secondary | ICD-10-CM

## 2013-05-22 DIAGNOSIS — M810 Age-related osteoporosis without current pathological fracture: Secondary | ICD-10-CM

## 2013-05-22 DIAGNOSIS — I5032 Chronic diastolic (congestive) heart failure: Secondary | ICD-10-CM | POA: Diagnosis present

## 2013-05-22 DIAGNOSIS — K449 Diaphragmatic hernia without obstruction or gangrene: Secondary | ICD-10-CM | POA: Diagnosis present

## 2013-05-22 DIAGNOSIS — K766 Portal hypertension: Secondary | ICD-10-CM | POA: Diagnosis present

## 2013-05-22 DIAGNOSIS — F411 Generalized anxiety disorder: Secondary | ICD-10-CM | POA: Diagnosis present

## 2013-05-22 DIAGNOSIS — I359 Nonrheumatic aortic valve disorder, unspecified: Secondary | ICD-10-CM

## 2013-05-22 DIAGNOSIS — Z794 Long term (current) use of insulin: Secondary | ICD-10-CM

## 2013-05-22 DIAGNOSIS — R5381 Other malaise: Secondary | ICD-10-CM

## 2013-05-22 DIAGNOSIS — Z6837 Body mass index (BMI) 37.0-37.9, adult: Secondary | ICD-10-CM

## 2013-05-22 DIAGNOSIS — J449 Chronic obstructive pulmonary disease, unspecified: Secondary | ICD-10-CM | POA: Diagnosis present

## 2013-05-22 DIAGNOSIS — K5520 Angiodysplasia of colon without hemorrhage: Secondary | ICD-10-CM

## 2013-05-22 DIAGNOSIS — K754 Autoimmune hepatitis: Secondary | ICD-10-CM

## 2013-05-22 DIAGNOSIS — F333 Major depressive disorder, recurrent, severe with psychotic symptoms: Secondary | ICD-10-CM

## 2013-05-22 DIAGNOSIS — R0602 Shortness of breath: Secondary | ICD-10-CM

## 2013-05-22 DIAGNOSIS — R251 Tremor, unspecified: Secondary | ICD-10-CM

## 2013-05-22 DIAGNOSIS — J42 Unspecified chronic bronchitis: Secondary | ICD-10-CM

## 2013-05-22 DIAGNOSIS — E1049 Type 1 diabetes mellitus with other diabetic neurological complication: Secondary | ICD-10-CM

## 2013-05-22 DIAGNOSIS — N3090 Cystitis, unspecified without hematuria: Secondary | ICD-10-CM

## 2013-05-22 DIAGNOSIS — I5033 Acute on chronic diastolic (congestive) heart failure: Secondary | ICD-10-CM

## 2013-05-22 DIAGNOSIS — G4733 Obstructive sleep apnea (adult) (pediatric): Secondary | ICD-10-CM | POA: Diagnosis present

## 2013-05-22 DIAGNOSIS — I129 Hypertensive chronic kidney disease with stage 1 through stage 4 chronic kidney disease, or unspecified chronic kidney disease: Secondary | ICD-10-CM | POA: Diagnosis present

## 2013-05-22 DIAGNOSIS — E7800 Pure hypercholesterolemia, unspecified: Secondary | ICD-10-CM

## 2013-05-22 DIAGNOSIS — M1990 Unspecified osteoarthritis, unspecified site: Secondary | ICD-10-CM

## 2013-05-22 DIAGNOSIS — J4489 Other specified chronic obstructive pulmonary disease: Secondary | ICD-10-CM

## 2013-05-22 DIAGNOSIS — IMO0001 Reserved for inherently not codable concepts without codable children: Secondary | ICD-10-CM

## 2013-05-22 DIAGNOSIS — K5901 Slow transit constipation: Secondary | ICD-10-CM

## 2013-05-22 DIAGNOSIS — K31811 Angiodysplasia of stomach and duodenum with bleeding: Principal | ICD-10-CM | POA: Diagnosis present

## 2013-05-22 DIAGNOSIS — F329 Major depressive disorder, single episode, unspecified: Secondary | ICD-10-CM | POA: Diagnosis present

## 2013-05-22 DIAGNOSIS — K319 Disease of stomach and duodenum, unspecified: Secondary | ICD-10-CM | POA: Diagnosis present

## 2013-05-22 DIAGNOSIS — K922 Gastrointestinal hemorrhage, unspecified: Secondary | ICD-10-CM

## 2013-05-22 DIAGNOSIS — K5730 Diverticulosis of large intestine without perforation or abscess without bleeding: Secondary | ICD-10-CM

## 2013-05-22 DIAGNOSIS — K31819 Angiodysplasia of stomach and duodenum without bleeding: Secondary | ICD-10-CM

## 2013-05-22 DIAGNOSIS — Z9181 History of falling: Secondary | ICD-10-CM

## 2013-05-22 DIAGNOSIS — D509 Iron deficiency anemia, unspecified: Secondary | ICD-10-CM

## 2013-05-22 DIAGNOSIS — Z79899 Other long term (current) drug therapy: Secondary | ICD-10-CM

## 2013-05-22 DIAGNOSIS — K7460 Unspecified cirrhosis of liver: Secondary | ICD-10-CM | POA: Diagnosis present

## 2013-05-22 DIAGNOSIS — D500 Iron deficiency anemia secondary to blood loss (chronic): Secondary | ICD-10-CM

## 2013-05-22 DIAGNOSIS — I2510 Atherosclerotic heart disease of native coronary artery without angina pectoris: Secondary | ICD-10-CM

## 2013-05-22 DIAGNOSIS — E119 Type 2 diabetes mellitus without complications: Secondary | ICD-10-CM

## 2013-05-22 DIAGNOSIS — N179 Acute kidney failure, unspecified: Secondary | ICD-10-CM | POA: Diagnosis not present

## 2013-05-22 DIAGNOSIS — E669 Obesity, unspecified: Secondary | ICD-10-CM

## 2013-05-22 DIAGNOSIS — D61818 Other pancytopenia: Secondary | ICD-10-CM

## 2013-05-22 DIAGNOSIS — I509 Heart failure, unspecified: Secondary | ICD-10-CM

## 2013-05-22 DIAGNOSIS — R531 Weakness: Secondary | ICD-10-CM

## 2013-05-22 LAB — COMPREHENSIVE METABOLIC PANEL
ALT: 41 U/L — ABNORMAL HIGH (ref 0–35)
AST: 41 U/L — ABNORMAL HIGH (ref 0–37)
Albumin: 2.7 g/dL — ABNORMAL LOW (ref 3.5–5.2)
CO2: 24 mEq/L (ref 19–32)
Calcium: 8.3 mg/dL — ABNORMAL LOW (ref 8.4–10.5)
Chloride: 99 mEq/L (ref 96–112)
Creatinine, Ser: 1.16 mg/dL — ABNORMAL HIGH (ref 0.50–1.10)
GFR calc Af Amer: 54 mL/min — ABNORMAL LOW (ref >90)
GFR calc non Af Amer: 47 mL/min — ABNORMAL LOW (ref >90)
Glucose, Bld: 376 mg/dL — ABNORMAL HIGH (ref 70–99)
Sodium: 133 mEq/L — ABNORMAL LOW (ref 135–145)
Total Protein: 5.7 g/dL — ABNORMAL LOW (ref 6.0–8.3)

## 2013-05-22 LAB — CBC WITH DIFFERENTIAL/PLATELET
Basophils Absolute: 0 10*3/uL (ref 0.0–0.1)
Basophils Relative: 0 % (ref 0–1)
HCT: 23.4 % — ABNORMAL LOW (ref 36.0–46.0)
Lymphocytes Relative: 13 % (ref 12–46)
MCHC: 32.9 g/dL (ref 30.0–36.0)
Monocytes Absolute: 0.3 10*3/uL (ref 0.1–1.0)
Monocytes Relative: 9 % (ref 3–12)
Neutro Abs: 2.3 10*3/uL (ref 1.7–7.7)
Neutrophils Relative %: 74 % (ref 43–77)
Platelets: 80 10*3/uL — ABNORMAL LOW (ref 150–400)
RDW: 15.3 % (ref 11.5–15.5)
WBC: 3.1 10*3/uL — ABNORMAL LOW (ref 4.0–10.5)

## 2013-05-22 LAB — PRO B NATRIURETIC PEPTIDE: Pro B Natriuretic peptide (BNP): 155.3 pg/mL — ABNORMAL HIGH (ref 0–125)

## 2013-05-22 MED ORDER — SODIUM CHLORIDE 0.9 % IV BOLUS (SEPSIS)
500.0000 mL | Freq: Once | INTRAVENOUS | Status: AC
  Administered 2013-05-23: 500 mL via INTRAVENOUS

## 2013-05-22 MED ORDER — SODIUM CHLORIDE 0.9 % IV SOLN
80.0000 mg | Freq: Once | INTRAVENOUS | Status: AC
  Administered 2013-05-23: 01:00:00 80 mg via INTRAVENOUS
  Filled 2013-05-22: qty 80

## 2013-05-22 NOTE — ED Provider Notes (Signed)
CSN: [id number]     Arrival date & time 05/22/13  2153 History   First MD Initiated Contact with Patient 05/22/13 2255     Chief Complaint  Patient presents with  . Shortness of Breath   (Consider location/radiation/quality/duration/timing/severity/associated sxs/prior Treatment) The history is provided by the patient.  [name] is a 72 y.o. female hx of COPD, CHF, gastric ulcer here with shortness of breath. Shortness of breath for the last several days. A week ago she was told to decrease her Lasix dose. She has worsening shortness of breath on exertion even walking to the bathroom. Denies any cough or fever. Also notes that she has gastric ulcers in the past and had some melena.    Past Medical History  Diagnosis Date  . Chronic airway obstruction, not elsewhere classified   . Unspecified chronic bronchitis   . Unspecified essential hypertension   . Coronary atherosclerosis of unspecified type of vessel, native or graft   . Palpitations   . Aortic valve disorders   . Pure hypercholesterolemia   . Morbid obesity   . Esophageal reflux   . Angiodysplasia of intestine (without mention of hemorrhage)   . Diverticulosis of colon (without mention of hemorrhage)   . Benign neoplasm of colon   . Autoimmune hepatitis   . Cystitis, unspecified   . Osteoarthrosis, unspecified whether generalized or localized, unspecified site   . Osteoporosis, unspecified   . Restless legs syndrome (RLS)   . Major depressive disorder, recurrent episode, severe, specified as with psychotic behavior   . Iron deficiency anemia secondary to blood loss (chronic)   . Coarse tremors     "just in my arms"  . Carpal tunnel syndrome on both sides   . Dysrhythmia     palpitations  . CHF (congestive heart failure)   . Asthma   . Shortness of breath     "any time really"  . Sleep apnea     "but I don't use the equipment"  . Type II diabetes mellitus   . Blood transfusion   . H/O hiatal hernia   .  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 Adenomatous colon polyp   . Anginal pain   . Anxiety   . Pneumonia   . Heart murmur   . AVM (arteriovenous malformation)   . Esophageal varices   . Portal hypertensive gastropathy   . Falls frequently   . UTI (lower urinary tract infection)    Past Surgical History  Procedure Laterality Date  . Hemorroidectomy    . Appendectomy    . Vaginal hysterectomy    . Breast biopsy      bilateral  . Cesarean section      x 3  . Appendectomy    . Esophagogastroduodenoscopy  06/12/2012    Procedure: ESOPHAGOGASTRODUODENOSCOPY (EGD);  Surgeon: Hart Carwin, MD;  Location: [facility];  Service: Endoscopy;  Laterality: N/A;  . Balloon dilation  06/12/2012    Procedure: BALLOON DILATION;  Surgeon: Hart Carwin, MD;  Location: [facility];  Service: Endoscopy;  Laterality: N/A;  ?balloon  . Esophagogastroduodenoscopy  09/10/2012    Procedure: ESOPHAGOGASTRODUODENOSCOPY (EGD);  Surgeon: Hart Carwin, MD;  Location: [facility];  Service: Endoscopy;  Laterality: N/A;  . Hot hemostasis  09/10/2012    Procedure: HOT HEMOSTASIS (ARGON PLASMA COAGULATION/BICAP);  Surgeon: Hart Carwin, MD;  Location: [facility];  Service: Endoscopy;  Laterality: N/A;  . Esophagogastroduodenoscopy N/A 01/18/2013    Procedure:  ESOPHAGOGASTRODUODENOSCOPY (EGD);  Surgeon: Hart Carwin, MD;  Location: [facility];  Service: Endoscopy;  Laterality: N/A;   Family History  Problem Relation Age of Onset  . Heart disease Mother   . Cervical cancer Mother   . Kidney disease Mother   . Diabetes Mother   . Breast cancer Sister   . Multiple sclerosis Sister   . Colon cancer Neg Hx   . Breast cancer Maternal Aunt     x 2  . Diabetes Father   . Diabetes Sister   . Multiple sclerosis Other    History  Substance Use Topics  . Smoking status: Former Smoker -- 1.50 packs/day for 25 years    Types: Cigarettes    Quit date: 08/26/1992  . Smokeless tobacco: Never Used  . Alcohol  Use: No     Comment: 11/22/11 "last alcoholic drink was years ago"   OB History   Grav Para Term Preterm Abortions TAB SAB Ect Mult Living                 Review of Systems  Respiratory: Positive for shortness of breath.   Gastrointestinal:       Melena   All other systems reviewed and are negative.    Allergies  Aspirin; Penicillins; and Theophylline  Home Medications   Current Outpatient Rx  Name  Route  Sig  Dispense  Refill  . albuterol (PROAIR HFA) 108 (90 BASE) MCG/ACT inhaler   Inhalation   Inhale 2 puffs into the lungs every 6 (six) hours as needed for shortness of breath.          . ALPRAZolam (XANAX) 1 MG tablet   Oral   Take 1 mg by mouth 2 (two) times daily as needed for anxiety.          [name] atenolol (TENORMIN) 50 MG tablet   Oral   Take 50 mg by mouth daily.         [name] b complex vitamins tablet   Oral   Take 1 tablet by mouth daily.          [name] buPROPion (WELLBUTRIN XL) 150 MG 24 hr tablet   Oral   Take 150 mg by mouth daily.         [name] desvenlafaxine (PRISTIQ) 100 MG 24 hr tablet   Oral   Take 150 mg by mouth daily. TAKE 150MG  DAILY         . dextromethorphan-guaiFENesin (MUCINEX DM) 30-600 MG per 12 hr tablet   Oral   Take 2 tablets by mouth every 12 (twelve) hours.          . Fluticasone-Salmeterol (ADVAIR DISKUS) 250-50 MCG/DOSE AEPB   Inhalation   Inhale 1 puff into the lungs 2 (two) times daily.   28 each   0   . furosemide (LASIX) 20 MG tablet   Oral   Take 2 tablets (40 mg total) by mouth 2 (two) times daily.         . Insulin Glargine 100 UNIT/ML SOPN   Subcutaneous   Inject 200 Units into the skin daily. And pen needles 1/day         . lisinopril (PRINIVIL,ZESTRIL) 5 MG tablet   Oral   Take 5 mg by mouth daily.         . Multiple Vitamins-Minerals (CENTRUM SILVER PO)   Oral   Take by mouth daily.         . pantoprazole (PROTONIX) 40 MG tablet  Oral   Take 40 mg by mouth daily.         .  polyethylene glycol (MIRALAX / GLYCOLAX) packet   Oral   Take 17 g by mouth daily as needed (constipation).         . potassium chloride SA (K-DUR,KLOR-CON) 10 MEQ tablet   Oral   Take 1 tablet (10 mEq total) by mouth every other day.   30 tablet   1   . promethazine (PHENERGAN) 25 MG tablet   Oral   Take 25 mg by mouth 2 (two) times daily as needed for nausea.         [name] QUEtiapine (SEROQUEL) 100 MG tablet   Oral   Take 400 mg by mouth at bedtime.          [name] rOPINIRole (REQUIP) 4 MG tablet   Oral   Take 4 mg by mouth at bedtime.         . Saline (OCEAN NASAL SPRAY NA)   Nasal   Place 1 spray into the nose daily as needed (congestion).         . Simethicone (GAS-X PO)   Oral   Take 1 tablet by mouth daily. Takes after the biggest meal of the day         . simvastatin (ZOCOR) 40 MG tablet   Oral   Take 40 mg by mouth at bedtime.         . sucralfate (CARAFATE) 1 GM/10ML suspension   Oral   Take 10 mL (1 g total) by mouth 2 (two) times daily.   420 mL   1   . Vitamin D, Ergocalciferol, (DRISDOL) 50000 UNITS CAPS capsule   Oral   Take 50,000 Units by mouth every 7 (seven) days. On fridays         . AMBULATORY NON FORMULARY MEDICATION      Continuous O2 @@ 3LMP         . B-D ULTRAFINE III SHORT PEN 31G X 8 MM MISC      USE AS DIRECTED   100 each   3   . glucose blood (FREESTYLE LITE) test strip   Other   1 each by Other route 3 (three) times daily. And lancets 3/day 250.01   50 each   6     Dx 250.00    BP 106/51  Pulse 54  Temp(Src) 98.2 F (36.8 C) (Oral)  Resp 17  SpO2 97% Physical Exam  Nursing note and vitals reviewed. Constitutional: She appears well-developed and well-nourished.  Chronically ill   HENT:  Head: Normocephalic.  Mouth/Throat: Oropharynx is clear and moist.  Eyes: Conjunctivae are normal. Pupils are equal, round, and reactive to light.  Neck: Normal range of motion. Neck supple.  Cardiovascular: Normal  rate, regular rhythm and normal heart sounds.   Pulmonary/Chest: Effort normal.  Dec breath sounds throughout. No wheezing or crackles   Abdominal: Soft. Bowel sounds are normal. She exhibits no distension. There is no tenderness. There is no rebound and no guarding.  Musculoskeletal:  1+ edema bilaterally   Neurological: She is alert.  Skin: Skin is warm and dry.  Psychiatric: She has a normal mood and affect. Her behavior is normal. Judgment and thought content normal.    ED Course  Procedures (including critical care time) CRITICAL CARE Performed by: Silverio Lay, Kayde Warehime   Total critical care time: 30 min   Critical care time was exclusive of separately billable procedures and treating other  patients.  Critical care was necessary to treat or prevent imminent or life-threatening deterioration.  Critical care was time spent personally by me on the following activities: development of treatment plan with patient and/or surrogate as well as nursing, discussions with consultants, evaluation of patient's response to treatment, examination of patient, obtaining history from patient or surrogate, ordering and performing treatments and interventions, ordering and review of laboratory studies, ordering and review of radiographic studies, pulse oximetry and re-evaluation of patient's condition.   Labs Review Labs Reviewed  PRO B NATRIURETIC PEPTIDE - Abnormal; Notable for the following:    Pro B Natriuretic peptide (BNP) 155.3 (*)    All other components within normal limits  CBC WITH DIFFERENTIAL - Abnormal; Notable for the following:    WBC 3.1 (*)    RBC 2.38 (*)    Hemoglobin 7.7 (*)    HCT 23.4 (*)    Platelets 80 (*)    Lymphs Abs 0.4 (*)    All other components within normal limits  COMPREHENSIVE METABOLIC PANEL - Abnormal; Notable for the following:    Sodium 133 (*)    Glucose, Bld 376 (*)    BUN 42 (*)    Creatinine, Ser 1.16 (*)    Calcium 8.3 (*)    Total Protein 5.7 (*)     Albumin 2.7 (*)    AST 41 (*)    ALT 41 (*)    Alkaline Phosphatase 133 (*)    GFR calc non Af Amer 47 (*)    GFR calc Af Amer 54 (*)    All other components within normal limits  OCCULT BLOOD, POC DEVICE - Abnormal; Notable for the following:    Fecal Occult Bld POSITIVE (*)    All other components within normal limits  TROPONIN I  BASIC METABOLIC PANEL  CBC  HEMOGLOBIN AND HEMATOCRIT, BLOOD  HEMOGLOBIN AND HEMATOCRIT, BLOOD  HEMOGLOBIN AND HEMATOCRIT, BLOOD  TYPE AND SCREEN  PREPARE RBC (CROSSMATCH)    Date: 05/22/2013  Rate: 62  Rhythm: normal sinus rhythm  QRS Axis: normal  Intervals: PR prolonged  ST/T Wave abnormalities: nonspecific ST changes  Conduction Disutrbances:none  Narrative Interpretation:   Old EKG Reviewed: unchanged    Imaging Review Dg Chest 2 View  05/22/2013   *RADIOLOGY REPORT*  Clinical Data: Shortness of breath.  CHEST - 2 VIEW  Comparison: 05/10/2013 and prior chest radiographs  Findings: The cardiomediastinal silhouette is unremarkable. Mild peribronchial thickening is stable. Lingular scarring is again noted. There is no evidence of focal airspace disease, pulmonary edema, suspicious pulmonary nodule/mass, pleural effusion, or pneumothorax. No acute bony abnormalities are identified.  IMPRESSION: No evidence of acute cardiopulmonary disease.   Original Report Authenticated By: [name] Pier, M.D.    MDM  No diagnosis found. [name] is a 72 y.o. female here with SOB. Will need to r/o CHF vs COPD vs GI bleed. Will get labs and cxr and bnp.   12:27 AM Occ positive. More anemic than usual. Ordered 1 U PRBC. Will admit to tele under Dr. Lovell Sheehan.    Richardean Canal, MD 05/23/13 Jacinta Shoe

## 2013-05-22 NOTE — ED Notes (Signed)
Patient transported to X-ray 

## 2013-05-22 NOTE — ED Notes (Addendum)
Pt. Reports progressing  exertional dyspnea with productive cough and abdominal swelling for the past several days , diagnosed with pneumonia last week prescribed with antibiotic. Pt. Stated history of CHF and COPD .

## 2013-05-23 ENCOUNTER — Encounter (HOSPITAL_COMMUNITY)
Admission: EM | Disposition: A | Discharge status: Home / Self Care | Payer: Self-pay | Source: Home / Self Care | Attending: Internal Medicine

## 2013-05-23 ENCOUNTER — Encounter (HOSPITAL_COMMUNITY): Payer: Self-pay | Admitting: *Deleted

## 2013-05-23 DIAGNOSIS — I5033 Acute on chronic diastolic (congestive) heart failure: Secondary | ICD-10-CM

## 2013-05-23 DIAGNOSIS — D61818 Other pancytopenia: Secondary | ICD-10-CM

## 2013-05-23 DIAGNOSIS — J441 Chronic obstructive pulmonary disease with (acute) exacerbation: Secondary | ICD-10-CM

## 2013-05-23 DIAGNOSIS — D649 Anemia, unspecified: Secondary | ICD-10-CM

## 2013-05-23 DIAGNOSIS — K754 Autoimmune hepatitis: Secondary | ICD-10-CM

## 2013-05-23 DIAGNOSIS — E7800 Pure hypercholesterolemia, unspecified: Secondary | ICD-10-CM

## 2013-05-23 DIAGNOSIS — R5381 Other malaise: Secondary | ICD-10-CM

## 2013-05-23 DIAGNOSIS — R0602 Shortness of breath: Secondary | ICD-10-CM

## 2013-05-23 DIAGNOSIS — D509 Iron deficiency anemia, unspecified: Secondary | ICD-10-CM

## 2013-05-23 DIAGNOSIS — I10 Essential (primary) hypertension: Secondary | ICD-10-CM

## 2013-05-23 LAB — CBC
HCT: 23.8 % — ABNORMAL LOW (ref 36.0–46.0)
Hemoglobin: 7.8 g/dL — ABNORMAL LOW (ref 12.0–15.0)
MCH: 31.7 pg (ref 26.0–34.0)
MCHC: 32.8 g/dL (ref 30.0–36.0)
MCV: 96.7 fL (ref 78.0–100.0)
RDW: 17 % — ABNORMAL HIGH (ref 11.5–15.5)
WBC: 3.4 10*3/uL — ABNORMAL LOW (ref 4.0–10.5)

## 2013-05-23 LAB — BASIC METABOLIC PANEL
BUN: 44 mg/dL — ABNORMAL HIGH (ref 6–23)
CO2: 27 mEq/L (ref 19–32)
Chloride: 102 mEq/L (ref 96–112)
Creatinine, Ser: 1.24 mg/dL — ABNORMAL HIGH (ref 0.50–1.10)
GFR calc Af Amer: 50 mL/min — ABNORMAL LOW (ref >90)
Glucose, Bld: 211 mg/dL — ABNORMAL HIGH (ref 70–99)
Potassium: 3.8 mEq/L (ref 3.5–5.1)

## 2013-05-23 LAB — GLUCOSE, CAPILLARY
Glucose-Capillary: 176 mg/dL — ABNORMAL HIGH (ref 70–99)
Glucose-Capillary: 160 mg/dL — ABNORMAL HIGH (ref 70–99)
Glucose-Capillary: 192 mg/dL — ABNORMAL HIGH (ref 70–99)

## 2013-05-23 LAB — HEMOGLOBIN AND HEMATOCRIT, BLOOD
HCT: 21.1 % — ABNORMAL LOW (ref 36.0–46.0)
Hemoglobin: 8.4 g/dL — ABNORMAL LOW (ref 12.0–15.0)

## 2013-05-23 LAB — PROTIME-INR: Prothrombin Time: 13.5 seconds (ref 11.6–15.2)

## 2013-05-23 LAB — OCCULT BLOOD X 1 CARD TO LAB, STOOL: Fecal Occult Bld: POSITIVE — AB

## 2013-05-23 SURGERY — EGD (ESOPHAGOGASTRODUODENOSCOPY)
Anesthesia: Moderate Sedation

## 2013-05-23 MED ORDER — ALBUTEROL SULFATE HFA 108 (90 BASE) MCG/ACT IN AERS: 2.0000 | INHALATION_SPRAY | Freq: Four times a day (QID) | RESPIRATORY_TRACT | Status: DC | PRN

## 2013-05-23 MED ORDER — BUPROPION HCL ER (XL) 150 MG PO TB24
150.0000 mg | ORAL_TABLET | Freq: Every day | ORAL | Status: DC
  Administered 2013-05-23 – 2013-05-26 (×3): 150 mg via ORAL
  Filled 2013-05-23 (×4): qty 1

## 2013-05-23 MED ORDER — HYDROMORPHONE HCL PF 1 MG/ML IJ SOLN: 0.5000 mg | INTRAMUSCULAR | Status: DC | PRN

## 2013-05-23 MED ORDER — POLYETHYLENE GLYCOL 3350 17 G PO PACK
17.0000 g | PACK | Freq: Every day | ORAL | Status: DC | PRN
  Administered 2013-05-23: 17 g via ORAL
  Filled 2013-05-23: qty 1

## 2013-05-23 MED ORDER — LISINOPRIL 5 MG PO TABS
5.0000 mg | ORAL_TABLET | Freq: Every day | ORAL | Status: DC
  Administered 2013-05-23 – 2013-05-26 (×3): 5 mg via ORAL
  Filled 2013-05-23 (×4): qty 1

## 2013-05-23 MED ORDER — ACETAMINOPHEN 325 MG PO TABS: 650.0000 mg | ORAL_TABLET | Freq: Four times a day (QID) | ORAL | Status: DC | PRN

## 2013-05-23 MED ORDER — ONDANSETRON HCL 4 MG PO TABS: 4.0000 mg | ORAL_TABLET | Freq: Four times a day (QID) | ORAL | Status: DC | PRN

## 2013-05-23 MED ORDER — OXYCODONE HCL 5 MG PO TABS
5.0000 mg | ORAL_TABLET | ORAL | Status: DC | PRN
  Administered 2013-05-23 – 2013-05-26 (×10): 5 mg via ORAL
  Filled 2013-05-23 (×10): qty 1

## 2013-05-23 MED ORDER — ATENOLOL 50 MG PO TABS
50.0000 mg | ORAL_TABLET | Freq: Every day | ORAL | Status: DC
  Administered 2013-05-23: 10:00:00 50 mg via ORAL
  Filled 2013-05-23 (×2): qty 1

## 2013-05-23 MED ORDER — MOMETASONE FURO-FORMOTEROL FUM 100-5 MCG/ACT IN AERO
2.0000 | INHALATION_SPRAY | Freq: Two times a day (BID) | RESPIRATORY_TRACT | Status: DC
  Administered 2013-05-23 – 2013-05-26 (×6): 2 via RESPIRATORY_TRACT
  Filled 2013-05-23: qty 8.8

## 2013-05-23 MED ORDER — VENLAFAXINE HCL ER 150 MG PO CP24
150.0000 mg | ORAL_CAPSULE | Freq: Every day | ORAL | Status: DC
  Administered 2013-05-23 – 2013-05-26 (×4): 150 mg via ORAL
  Filled 2013-05-23 (×5): qty 1

## 2013-05-23 MED ORDER — VITAMIN D (ERGOCALCIFEROL) 1.25 MG (50000 UNIT) PO CAPS: 50000.0000 [IU] | ORAL_CAPSULE | ORAL | Status: DC

## 2013-05-23 MED ORDER — ZOLPIDEM TARTRATE 5 MG PO TABS
5.0000 mg | ORAL_TABLET | Freq: Every evening | ORAL | Status: DC | PRN
  Filled 2013-05-23: qty 1

## 2013-05-23 MED ORDER — ALPRAZOLAM 0.5 MG PO TABS: 1.0000 mg | ORAL_TABLET | Freq: Two times a day (BID) | ORAL | Status: DC | PRN

## 2013-05-23 MED ORDER — INSULIN GLARGINE 100 UNIT/ML ~~LOC~~ SOLN
100.0000 [IU] | Freq: Two times a day (BID) | SUBCUTANEOUS | Status: DC
  Administered 2013-05-23 – 2013-05-26 (×6): 100 [IU] via SUBCUTANEOUS
  Filled 2013-05-23 (×10): qty 1

## 2013-05-23 MED ORDER — PANTOPRAZOLE SODIUM 40 MG IV SOLR
40.0000 mg | INTRAVENOUS | Status: DC
  Administered 2013-05-24: 02:00:00 40 mg via INTRAVENOUS
  Filled 2013-05-23: qty 40

## 2013-05-23 MED ORDER — ROPINIROLE HCL 1 MG PO TABS
4.0000 mg | ORAL_TABLET | Freq: Every day | ORAL | Status: DC
  Administered 2013-05-23 – 2013-05-25 (×3): 4 mg via ORAL
  Filled 2013-05-23 (×5): qty 4

## 2013-05-23 MED ORDER — QUETIAPINE FUMARATE 400 MG PO TABS
400.0000 mg | ORAL_TABLET | Freq: Every day | ORAL | Status: DC
  Administered 2013-05-23 – 2013-05-25 (×3): 400 mg via ORAL
  Filled 2013-05-23 (×4): qty 1

## 2013-05-23 MED ORDER — SIMVASTATIN 40 MG PO TABS
40.0000 mg | ORAL_TABLET | Freq: Every day | ORAL | Status: DC
  Administered 2013-05-23 – 2013-05-25 (×3): 40 mg via ORAL
  Filled 2013-05-23 (×4): qty 1

## 2013-05-23 MED ORDER — SIMETHICONE 80 MG PO CHEW
160.0000 mg | CHEWABLE_TABLET | Freq: Four times a day (QID) | ORAL | Status: DC | PRN
  Filled 2013-05-23: qty 2

## 2013-05-23 MED ORDER — SODIUM CHLORIDE 0.9 % IV SOLN
INTRAVENOUS | Status: DC
  Administered 2013-05-23: 01:00:00 via INTRAVENOUS
  Administered 2013-05-24: 500 mL via INTRAVENOUS

## 2013-05-23 MED ORDER — SODIUM CHLORIDE 0.9 % IJ SOLN
3.0000 mL | Freq: Two times a day (BID) | INTRAMUSCULAR | Status: DC
  Administered 2013-05-23 – 2013-05-26 (×7): 3 mL via INTRAVENOUS

## 2013-05-23 MED ORDER — MOMETASONE FURO-FORMOTEROL FUM 100-5 MCG/ACT IN AERO
2.0000 | INHALATION_SPRAY | Freq: Two times a day (BID) | RESPIRATORY_TRACT | Status: DC
  Filled 2013-05-23: qty 8.8

## 2013-05-23 MED ORDER — ALUM & MAG HYDROXIDE-SIMETH 200-200-20 MG/5ML PO SUSP: 30.0000 mL | Freq: Four times a day (QID) | ORAL | Status: DC | PRN

## 2013-05-23 MED ORDER — ACETAMINOPHEN 650 MG RE SUPP: 650.0000 mg | Freq: Four times a day (QID) | RECTAL | Status: DC | PRN

## 2013-05-23 MED ORDER — POTASSIUM CHLORIDE CRYS ER 10 MEQ PO TBCR
10.0000 meq | EXTENDED_RELEASE_TABLET | ORAL | Status: DC
  Administered 2013-05-23 – 2013-05-25 (×2): 10 meq via ORAL
  Filled 2013-05-23 (×2): qty 1

## 2013-05-23 MED ORDER — FUROSEMIDE 40 MG PO TABS
40.0000 mg | ORAL_TABLET | Freq: Two times a day (BID) | ORAL | Status: DC
  Administered 2013-05-23 – 2013-05-26 (×7): 40 mg via ORAL
  Filled 2013-05-23 (×9): qty 1

## 2013-05-23 MED ORDER — ONDANSETRON HCL 4 MG/2ML IJ SOLN
4.0000 mg | Freq: Four times a day (QID) | INTRAMUSCULAR | Status: DC | PRN
  Administered 2013-05-23 (×2): 4 mg via INTRAVENOUS
  Filled 2013-05-23 (×2): qty 2

## 2013-05-23 NOTE — H&P (Signed)
[facility] History and Physical  [name] ZOX:[id number] DOB: 24-May-1941 DOA: 05/22/2013  Referring physician:  EDP PCP: Michele Mcalpine, MD  Specialists:  Dr. [name]  Chief Complaint: SOB with exertion  HPI: [name] is a 72 y.o. female with Multiple medical problems including Autoimmune Hepatitis and Portal Hypertensive Gastropathy who presents to the ED with complaints of worsening SOB and dyspnea with exertion over the past 2 days.   She reports having chronic dyspnea and reports having melena  For years.  She reports that she has seen her GI doctor Dr Juanda Chance last month, and she also reports that she at times has had to have multiple transfusions.   She denies having any ADB pain but reports having nausea.  In the ED , a rectal examination was performed by the EDP and was found to be HEME positive, and her hemoglobin level was found to be 7.7.  aunit of PRBCs was ordered for transfusion since she was symptomatic in the ED.   She was referred for admission.      Review of Systems: The patient denies anorexia, fever, chills, headaches, weight loss, vision loss, diplopia, dizziness, decreased hearing, rhinitis, hoarseness, chest pain, syncope, dyspnea on exertion, peripheral edema, balance deficits, cough, hemoptysis, abdominal pain,  vomiting, diarrhea, constipation, hematemesis, hematochezia, severe indigestion/heartburn, dysuria, hematuria, incontinence, muscle weakness, suspicious skin lesions, transient blindness, difficulty walking, depression, unusual weight change, abnormal bleeding, enlarged lymph nodes, angioedema, and breast masses.    Past Medical History  Diagnosis Date  . Chronic airway obstruction, not elsewhere classified   . Unspecified chronic bronchitis   . Unspecified essential hypertension   . Coronary atherosclerosis of unspecified type of vessel, native or graft   . Palpitations   . Aortic valve disorders   . Pure hypercholesterolemia   . Morbid  obesity   . Esophageal reflux   . Angiodysplasia of intestine (without mention of hemorrhage)   . Diverticulosis of colon (without mention of hemorrhage)   . Benign neoplasm of colon   . Autoimmune hepatitis   . Cystitis, unspecified   . Osteoarthrosis, unspecified whether generalized or localized, unspecified site   . Osteoporosis, unspecified   . Restless legs syndrome (RLS)   . Major depressive disorder, recurrent episode, severe, specified as with psychotic behavior   . Iron deficiency anemia secondary to blood loss (chronic)   . Coarse tremors     "just in my arms"  . Carpal tunnel syndrome on both sides   . Dysrhythmia     palpitations  . CHF (congestive heart failure)   . Asthma   . Shortness of breath     "any time really"  . Sleep apnea     "but I don't use the equipment"  . Type II diabetes mellitus   . Blood transfusion   . H/O hiatal hernia   . 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 Adenomatous colon polyp   . Anginal pain   . Anxiety   . Pneumonia   . Heart murmur   . AVM (arteriovenous malformation)   . Esophageal varices   . Portal hypertensive gastropathy   . Falls frequently   . UTI (lower urinary tract infection)     Past Surgical History  Procedure Laterality Date  . Hemorroidectomy    . Appendectomy    . Vaginal hysterectomy    . Breast biopsy      bilateral  . Cesarean section      x  3  . Appendectomy    . Esophagogastroduodenoscopy  06/12/2012    Procedure: ESOPHAGOGASTRODUODENOSCOPY (EGD);  Surgeon: Hart Carwin, MD;  Location: [facility];  Service: Endoscopy;  Laterality: N/A;  . Balloon dilation  06/12/2012    Procedure: BALLOON DILATION;  Surgeon: Hart Carwin, MD;  Location: [facility];  Service: Endoscopy;  Laterality: N/A;  ?balloon  . Esophagogastroduodenoscopy  09/10/2012    Procedure: ESOPHAGOGASTRODUODENOSCOPY (EGD);  Surgeon: Hart Carwin, MD;  Location: [facility];  Service: Endoscopy;  Laterality: N/A;  . Hot hemostasis   09/10/2012    Procedure: HOT HEMOSTASIS (ARGON PLASMA COAGULATION/BICAP);  Surgeon: Hart Carwin, MD;  Location: [facility];  Service: Endoscopy;  Laterality: N/A;  . Esophagogastroduodenoscopy N/A 01/18/2013    Procedure: ESOPHAGOGASTRODUODENOSCOPY (EGD);  Surgeon: Hart Carwin, MD;  Location: [facility];  Service: Endoscopy;  Laterality: N/A;    Prior to Admission medications   Medication Sig Start Date End Date Taking? Authorizing Provider  albuterol (PROAIR HFA) 108 (90 BASE) MCG/ACT inhaler Inhale 2 puffs into the lungs every 6 (six) hours as needed for shortness of breath.    Yes Historical Provider, MD  ALPRAZolam Prudy Feeler) 1 MG tablet Take 1 mg by mouth 2 (two) times daily as needed for anxiety.    Yes Historical Provider, MD  atenolol (TENORMIN) 50 MG tablet Take 50 mg by mouth daily.   Yes Historical Provider, MD  b complex vitamins tablet Take 1 tablet by mouth daily.    Yes Historical Provider, MD  buPROPion (WELLBUTRIN XL) 150 MG 24 hr tablet Take 150 mg by mouth daily.   Yes Historical Provider, MD  desvenlafaxine (PRISTIQ) 100 MG 24 hr tablet Take 150 mg by mouth daily. TAKE 150MG  DAILY   Yes Historical Provider, MD  dextromethorphan-guaiFENesin (MUCINEX DM) 30-600 MG per 12 hr tablet Take 2 tablets by mouth every 12 (twelve) hours.    Yes Historical Provider, MD  Fluticasone-Salmeterol (ADVAIR DISKUS) 250-50 MCG/DOSE AEPB Inhale 1 puff into the lungs 2 (two) times daily. 04/27/13  Yes Michele Mcalpine, MD  furosemide (LASIX) 20 MG tablet Take 2 tablets (40 mg total) by mouth 2 (two) times daily. 05/10/13  Yes Scott Moishe Spice, PA-C  Insulin Glargine 100 UNIT/ML SOPN Inject 200 Units into the skin daily. And pen needles 1/day 05/11/13  Yes Romero Belling, MD  lisinopril (PRINIVIL,ZESTRIL) 5 MG tablet Take 5 mg by mouth daily.   Yes Historical Provider, MD  Multiple Vitamins-Minerals (CENTRUM SILVER PO) Take by mouth daily.   Yes Historical Provider, MD  pantoprazole (PROTONIX) 40 MG tablet  Take 40 mg by mouth daily.   Yes Historical Provider, MD  polyethylene glycol (MIRALAX / GLYCOLAX) packet Take 17 g by mouth daily as needed (constipation).   Yes Historical Provider, MD  potassium chloride SA (K-DUR,KLOR-CON) 10 MEQ tablet Take 1 tablet (10 mEq total) by mouth every other day. 02/03/13  Yes Tora Kindred York, PA-C  promethazine (PHENERGAN) 25 MG tablet Take 25 mg by mouth 2 (two) times daily as needed for nausea.   Yes Historical Provider, MD  QUEtiapine (SEROQUEL) 100 MG tablet Take 400 mg by mouth at bedtime.  11/22/11  Yes Historical Provider, MD  rOPINIRole (REQUIP) 4 MG tablet Take 4 mg by mouth at bedtime.   Yes Historical Provider, MD  Saline (OCEAN NASAL SPRAY NA) Place 1 spray into the nose daily as needed (congestion).   Yes Historical Provider, MD  Simethicone (GAS-X PO) Take 1 tablet by mouth daily.  Takes after the biggest meal of the day   Yes Historical Provider, MD  simvastatin (ZOCOR) 40 MG tablet Take 40 mg by mouth at bedtime.   Yes Historical Provider, MD  sucralfate (CARAFATE) 1 GM/10ML suspension Take 10 mL (1 g total) by mouth 2 (two) times daily. 04/13/13  Yes Hart Carwin, MD  Vitamin D, Ergocalciferol, (DRISDOL) 50000 UNITS CAPS capsule Take 50,000 Units by mouth every 7 (seven) days. On fridays   Yes Historical Provider, MD  AMBULATORY NON FORMULARY MEDICATION Continuous O2 @@ 3LMP    Historical Provider, MD  B-D ULTRAFINE III SHORT PEN 31G X 8 MM MISC USE AS DIRECTED 01/16/13   Michele Mcalpine, MD  glucose blood (FREESTYLE LITE) test strip 1 each by Other route 3 (three) times daily. And lancets 3/day 250.01 05/11/13   Romero Belling, MD    Allergies  Allergen Reactions  . Aspirin Other (See Comments)    Causes nosebleeds  . Penicillins Itching  . Theophylline Nausea And Vomiting    Social History:  reports that she quit smoking about 20 years ago. Her smoking use included Cigarettes. She has a 37.5 pack-year smoking history. She has never used smokeless  tobacco. She reports that she does not drink alcohol or use illicit drugs.     Family History  Problem Relation Age of Onset  . Heart disease Mother   . Cervical cancer Mother   . Kidney disease Mother   . Diabetes Mother   . Breast cancer Sister   . Multiple sclerosis Sister   . Colon cancer Neg Hx   . Breast cancer Maternal Aunt     x 2  . Diabetes Father   . Diabetes Sister   . Multiple sclerosis Other        Physical Exam:  GEN:  Pleasant  Obese Elderly Appearing 72 y.o.Caucasian female  examined  and in no acute distress; cooperative with exam Filed Vitals:   05/23/13 0030 05/23/13 0045 05/23/13 0100 05/23/13 0130  BP: 96/53 111/53 106/82 131/63  Pulse: 54 52 51 57  Temp:    97.8 F (36.6 C)  TempSrc:    Oral  Resp: 20 19 19 20   Height:    5\' 4"  (1.626 m)  Weight:    101.1 kg (222 lb 14.2 oz)  SpO2: 100% 100% 91% 98%   Blood pressure 131/63, pulse 57, temperature 97.8 F (36.6 C), temperature source Oral, resp. rate 20, height 5\' 4"  (1.626 m), weight 101.1 kg (222 lb 14.2 oz), SpO2 98.00%. PSYCH: She is alert and oriented x4; does not appear anxious does not appear depressed; affect is normal HEENT: Normocephalic and Atraumatic, Mucous membranes pink; PERRLA; EOM intact; Fundi:  Benign;  No scleral icterus, Nares: Patent, Oropharynx: Clear, Practically Edentulous with few sparse teeth on bottom palate, Neck:  FROM, no cervical lymphadenopathy nor thyromegaly or carotid bruit; no JVD; Breasts:: Not examined CHEST WALL: No tenderness CHEST: Normal respiration, clear to auscultation bilaterally HEART: Regular rate and rhythm; no murmurs rubs or gallops BACK: No kyphosis or scoliosis; no CVA tenderness ABDOMEN: Positive Bowel Sounds, Obese, soft non-tender; no masses, no organomegaly, no pannus; no intertriginous candida. Rectal Exam: Not done EXTREMITIES: No cyanosis, clubbing or edema; no ulcerations. Genitalia: not examined PULSES: 2+ and symmetric SKIN: Normal  hydration no rash or ulceration CNS: Cranial nerves 2-12 grossly intact no focal neurologic deficit    Labs on Admission:  Basic Metabolic Panel:  Recent Labs Lab 05/17/13 1232 05/22/13 2209  NA 134* 133*  K 4.5 4.4  CL 104 99  CO2 24 24  GLUCOSE 244* 376*  BUN 41* 42*  CREATININE 1.5* 1.16*  CALCIUM 8.7 8.3*   Liver Function Tests:  Recent Labs Lab 05/22/13 2209  AST 41*  ALT 41*  ALKPHOS 133*  BILITOT 0.6  PROT 5.7*  ALBUMIN 2.7*   No results found for this basename: LIPASE, AMYLASE,  in the last 168 hours No results found for this basename: AMMONIA,  in the last 168 hours CBC:  Recent Labs Lab 05/22/13 2209 05/23/13 0104  WBC 3.1*  --   NEUTROABS 2.3  --   HGB 7.7* 7.0*  HCT 23.4* 21.1*  MCV 98.3  --   PLT 80*  --    Cardiac Enzymes:  Recent Labs Lab 05/22/13 2209  TROPONINI <0.30    BNP (last 3 results)  Recent Labs  04/24/13 1805 05/10/13 1034 05/22/13 2209  PROBNP 147.3* 72.0 155.3*   CBG: No results found for this basename: GLUCAP,  in the last 168 hours  Radiological Exams on Admission: Dg Chest 2 View  05/22/2013   *RADIOLOGY REPORT*  Clinical Data: Shortness of breath.  CHEST - 2 VIEW  Comparison: 05/10/2013 and prior chest radiographs  Findings: The cardiomediastinal silhouette is unremarkable. Mild peribronchial thickening is stable. Lingular scarring is again noted. There is no evidence of focal airspace disease, pulmonary edema, suspicious pulmonary nodule/mass, pleural effusion, or pneumothorax. No acute bony abnormalities are identified.  IMPRESSION: No evidence of acute cardiopulmonary disease.   Original Report Authenticated By: [name] Pier, M.D.     Assessment/Plan Principal Problem:   Anemia, iron deficiency Active Problems:   SOB (shortness of breath)   DM   HYPERTENSION   AORTIC STENOSIS   COPD   Autoimmune hepatitis   Other pancytopenia   Congestive heart failure, unspecified   1.  Anemia of Iron Deficiency -  from chronic Blood Loss,melena-  Known to GI Dr. [name] last evaluated in 03/2013,  Transfuse 1 unit PRBCs, and check H/Hs q 8 hours x 3.   IV Protonix. Anemia panel sent.  Notify [facility] in AM.      Has hx of Portal Hypertensive Gastropathy, and Colonic AVMs previously diagnosed.    2.  SOB - Multifactorial, due to #1 and Hx of Chronic CHF, and COPD.    3.  DM-  SSI Coverage and Check HbA1C in AM.      4.  HTN-   Monitor BPs, continue  Lisinopril Rx.     5.  COPD continue Albuterol inhaler, and O2 PRN.    6.   Pancytopenia-  Due to Liver Disease affecting Plts, and Anemia due to Blood loss.     7.   Autoimmune Hepatitis - Hx-  Chronic.    8.   CHF - continue lasix Rx, and monitor for fluid overload.     9.   Aortic Stenosis-  Unchanged.     10.  DVT prophylaxis with SCDs.       Code Status:    FULL CODE Family Communication:    Husband at Bedside Disposition Plan:     Inpatient     Time spent:  57 Minutes  Ron Parker [facility] Pager [phone]  If 7PM-7AM, please contact night-coverage [email or web address] Password [email or web address] 05/23/2013, 2:50 AM

## 2013-05-23 NOTE — Consult Note (Signed)
Cross cover for [facility] Reason for Consult: Melena and anemia. Referring Physician: THP  [name] is an 71 y.o. female.  HPI:  71 year old female, with multiple medical problems listed below, followed by Dr. Dora Brodie for several years for what she describes as "watermelon stomach" and chronic liver disease. She apparently was in her USOH till 2 days PTA when she started noticing melenic stools and has worsening of her exertional dysnea. She claims she has had melena off and on for several years and has had multiple transfusions and endoscopies for her gastric ectasias. She denies having any melena. She is slightly nauseated but has not vomited. She has a fairly good appetite and her weight ahs been stable. She is on iron for chronic IDA.     Past Medical History  Diagnosis Date  . Chronic airway obstruction, not elsewhere classified   . Unspecified chronic bronchitis   . Unspecified essential hypertension   . Coronary atherosclerosis of unspecified type of vessel, native or graft   . Palpitations   . Aortic valve disorders   . Pure hypercholesterolemia   . Morbid obesity   . Esophageal reflux   . Angiodysplasia of intestine (without mention of hemorrhage)   . Diverticulosis of colon (without mention of hemorrhage)   . Benign neoplasm of colon   . Autoimmune hepatitis   . Cystitis, unspecified   . Osteoarthrosis, unspecified whether generalized or localized, unspecified site   . Osteoporosis, unspecified   . Restless legs syndrome (RLS)   . Major depressive disorder, recurrent episode, severe, specified as with psychotic behavior   . Iron deficiency anemia secondary to blood loss (chronic)   . Coarse tremors     "just in my arms"  . Carpal tunnel syndrome on both sides   . Dysrhythmia     palpitations  . CHF (congestive heart failure)   . Asthma   . Shortness of breath     "any time really"  . Sleep apnea     "but I don't use the equipment"  . Type II diabetes mellitus    . Blood transfusion   . H/O hiatal hernia   . 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 Adenomatous colon polyp   . Anginal pain   . Anxiety   . Pneumonia   . Heart murmur   . AVM (arteriovenous malformation)   . Esophageal varices   . Portal hypertensive gastropathy   . Falls frequently   . UTI (lower urinary tract infection)    Past Surgical History  Procedure Laterality Date  . Hemorroidectomy    . Appendectomy    . Vaginal hysterectomy    . Breast biopsy      bilateral  . Cesarean section      x 3  . Appendectomy    . Esophagogastroduodenoscopy  06/12/2012    Procedure: ESOPHAGOGASTRODUODENOSCOPY (EGD);  Surgeon: Dora M Brodie, MD;  Location: [facility];  Service: Endoscopy;  Laterality: N/A;  . Balloon dilation  06/12/2012    Procedure: BALLOON DILATION;  Surgeon: Dora M Brodie, MD;  Location: [facility];  Service: Endoscopy;  Laterality: N/A;  ?balloon  . Esophagogastroduodenoscopy  09/10/2012    Procedure: ESOPHAGOGASTRODUODENOSCOPY (EGD);  Surgeon: Dora M Brodie, MD;  Location: [facility];  Service: Endoscopy;  Laterality: N/A;  . Hot hemostasis  09/10/2012    Procedure: HOT HEMOSTASIS (ARGON PLASMA COAGULATION/BICAP);  Surgeon: Dora M Brodie, MD;  Location: [facility];  Service: Endoscopy;  Laterality:   N/A;  . Esophagogastroduodenoscopy N/A 01/18/2013    Procedure: ESOPHAGOGASTRODUODENOSCOPY (EGD);  Surgeon: Dora M Brodie, MD;  Location: [facility];  Service: Endoscopy;  Laterality: N/A;   Family History  Problem Relation Age of Onset  . Heart disease Mother   . Cervical cancer Mother   . Kidney disease Mother   . Diabetes Mother   . Breast cancer Sister   . Multiple sclerosis Sister   . Colon cancer Neg Hx   . Breast cancer Maternal Aunt     x 2  . Diabetes Father   . Diabetes Sister   . Multiple sclerosis Other    Social History:  reports that she quit smoking about 20 years ago. Her smoking use included Cigarettes. She has a 37.5 pack-year smoking  history. She has never used smokeless tobacco. She reports that she does not drink alcohol or use illicit drugs.  Allergies:  Allergies  Allergen Reactions  . Aspirin Other (See Comments)    Causes nosebleeds  . Penicillins Itching  . Theophylline Nausea And Vomiting   Medications: I have reviewed the patient's current medications.  Results for orders placed during the hospital encounter of 05/22/13 (from the past 48 hour(s))  PRO B NATRIURETIC PEPTIDE     Status: Abnormal   Collection Time    05/22/13 10:09 PM      Result Value Range   Pro B Natriuretic peptide (BNP) 155.3 (*) 0 - 125 pg/mL  CBC WITH DIFFERENTIAL     Status: Abnormal   Collection Time    05/22/13 10:09 PM      Result Value Range   WBC 3.1 (*) 4.0 - 10.5 K/uL   RBC 2.38 (*) 3.87 - 5.11 MIL/uL   Hemoglobin 7.7 (*) 12.0 - 15.0 g/dL   HCT 23.4 (*) 36.0 - 46.0 %   MCV 98.3  78.0 - 100.0 fL   MCH 32.4  26.0 - 34.0 pg   MCHC 32.9  30.0 - 36.0 g/dL   RDW 15.3  11.5 - 15.5 %   Platelets 80 (*) 150 - 400 K/uL   Comment: CONSISTENT WITH PREVIOUS RESULT   Neutrophils Relative % 74  43 - 77 %   Neutro Abs 2.3  1.7 - 7.7 K/uL   Lymphocytes Relative 13  12 - 46 %   Lymphs Abs 0.4 (*) 0.7 - 4.0 K/uL   Monocytes Relative 9  3 - 12 %   Monocytes Absolute 0.3  0.1 - 1.0 K/uL   Eosinophils Relative 3  0 - 5 %   Eosinophils Absolute 0.1  0.0 - 0.7 K/uL   Basophils Relative 0  0 - 1 %   Basophils Absolute 0.0  0.0 - 0.1 K/uL  COMPREHENSIVE METABOLIC PANEL     Status: Abnormal   Collection Time    05/22/13 10:09 PM      Result Value Range   Sodium 133 (*) 135 - 145 mEq/L   Potassium 4.4  3.5 - 5.1 mEq/L   Chloride 99  96 - 112 mEq/L   CO2 24  19 - 32 mEq/L   Glucose, Bld 376 (*) 70 - 99 mg/dL   BUN 42 (*) 6 - 23 mg/dL   Creatinine, Ser 1.16 (*) 0.50 - 1.10 mg/dL   Calcium 8.3 (*) 8.4 - 10.5 mg/dL   Total Protein 5.7 (*) 6.0 - 8.3 g/dL   Albumin 2.7 (*) 3.5 - 5.2 g/dL   AST 41 (*) 0 - 37 U/L   ALT 41 (*)   0 - 35 U/L    Alkaline Phosphatase 133 (*) 39 - 117 U/L   Total Bilirubin 0.6  0.3 - 1.2 mg/dL   GFR calc non Af Amer 47 (*) >90 mL/min   GFR calc Af Amer 54 (*) >90 mL/min   Comment: (NOTE)     The eGFR has been calculated using the CKD EPI equation.     This calculation has not been validated in all clinical situations.     eGFR's persistently <90 mL/min signify possible Chronic Kidney     Disease.  TROPONIN I     Status: None   Collection Time    05/22/13 10:09 PM      Result Value Range   Troponin I <0.30  <0.30 ng/mL   Comment:            Due to the release kinetics of cTnI,     a negative result within the first hours     of the onset of symptoms does not rule out     myocardial infarction with certainty.     If myocardial infarction is still suspected,     repeat the test at appropriate intervals.  OCCULT BLOOD, POC DEVICE     Status: Abnormal   Collection Time    05/22/13 11:23 PM      Result Value Range   Fecal Occult Bld POSITIVE (*) NEGATIVE  PREPARE RBC (CROSSMATCH)     Status: None   Collection Time    05/22/13 11:59 PM      Result Value Range   Order Confirmation ORDER PROCESSED BY BLOOD BANK    TYPE AND SCREEN     Status: None   Collection Time    05/23/13 12:02 AM      Result Value Range   ABO/RH(D) O POS     Antibody Screen POS     Sample Expiration 05/26/2013     Antibody Identification ANTI-E     DAT, IgG NEG     Unit Number [id number]     Blood Component Type RED CELLS,LR     Unit division 00     Status of Unit ALLOCATED     Transfusion Status OK TO TRANSFUSE     Crossmatch Result COMPATIBLE     Donor AG Type NEGATIVE FOR E ANTIGEN     Unit Number [id number]     Blood Component Type RBC LR PHER2     Unit division 00     Status of Unit ISSUED     Transfusion Status OK TO TRANSFUSE     Crossmatch Result COMPATIBLE     Donor AG Type NEGATIVE FOR E ANTIGEN    HEMOGLOBIN AND HEMATOCRIT, BLOOD     Status: Abnormal   Collection Time    05/23/13  1:04 AM       Result Value Range   Hemoglobin 7.0 (*) 12.0 - 15.0 g/dL   HCT 21.1 (*) 36.0 - 46.0 %  GLUCOSE, CAPILLARY     Status: Abnormal   Collection Time    05/23/13  1:51 AM      Result Value Range   Glucose-Capillary 244 (*) 70 - 99 mg/dL   Comment 1 Notify RN     Comment 2 Documented in Chart    BASIC METABOLIC PANEL     Status: Abnormal   Collection Time    05/23/13  6:40 AM      Result Value Range   Sodium 137  135 - 145 mEq/L     Potassium 3.8  3.5 - 5.1 mEq/L   Chloride 102  96 - 112 mEq/L   CO2 27  19 - 32 mEq/L   Glucose, Bld 211 (*) 70 - 99 mg/dL   BUN 44 (*) 6 - 23 mg/dL   Creatinine, Ser 1.24 (*) 0.50 - 1.10 mg/dL   Calcium 8.5  8.4 - 10.5 mg/dL   GFR calc non Af Amer 43 (*) >90 mL/min   GFR calc Af Amer 50 (*) >90 mL/min   Comment: (NOTE)     The eGFR has been calculated using the CKD EPI equation.     This calculation has not been validated in all clinical situations.     eGFR's persistently <90 mL/min signify possible Chronic Kidney     Disease.  CBC     Status: Abnormal   Collection Time    05/23/13  6:40 AM      Result Value Range   WBC 3.4 (*) 4.0 - 10.5 K/uL   RBC 2.46 (*) 3.87 - 5.11 MIL/uL   Hemoglobin 7.8 (*) 12.0 - 15.0 g/dL   HCT 23.8 (*) 36.0 - 46.0 %   MCV 96.7  78.0 - 100.0 fL   MCH 31.7  26.0 - 34.0 pg   MCHC 32.8  30.0 - 36.0 g/dL   RDW 17.0 (*) 11.5 - 15.5 %   Platelets 77 (*) 150 - 400 K/uL   Comment: CONSISTENT WITH PREVIOUS RESULT  GLUCOSE, CAPILLARY     Status: Abnormal   Collection Time    05/23/13  7:28 AM      Result Value Range   Glucose-Capillary 176 (*) 70 - 99 mg/dL   Comment 1 Documented in Chart     Comment 2 Notify RN    GLUCOSE, CAPILLARY     Status: Abnormal   Collection Time    05/23/13 11:39 AM      Result Value Range   Glucose-Capillary 160 (*) 70 - 99 mg/dL   Comment 1 Documented in Chart     Comment 2 Notify RN     Dg Chest 2 View  05/22/2013   *RADIOLOGY REPORT*  Clinical Data: Shortness of breath.  CHEST - 2  VIEW  Comparison: 05/10/2013 and prior chest radiographs  Findings: The cardiomediastinal silhouette is unremarkable. Mild peribronchial thickening is stable. Lingular scarring is again noted. There is no evidence of focal airspace disease, pulmonary edema, suspicious pulmonary nodule/mass, pleural effusion, or pneumothorax. No acute bony abnormalities are identified.  IMPRESSION: No evidence of acute cardiopulmonary disease.   Original Report Authenticated By: Jeffrey Hu, M.D.   Review of Systems  Constitutional: Positive for malaise/fatigue. Negative for weight loss.  HENT: Negative.   Eyes: Negative.   Respiratory: Negative.   Cardiovascular: Negative.   Gastrointestinal: Positive for heartburn, nausea, constipation, blood in stool and melena. Negative for vomiting and abdominal pain.  Skin: Negative.   Neurological: Positive for weakness.  Endo/Heme/Allergies: Negative.   Psychiatric/Behavioral: Positive for depression.   Blood pressure 122/49, pulse 58, temperature 98.4 F (36.9 C), temperature source Oral, resp. rate 18, height 5' 4" (1.626 m), weight 101.1 kg (222 lb 14.2 oz), SpO2 94.00%. Physical Exam  Constitutional: She is oriented to person, place, and time. She appears well-developed and well-nourished.  HENT:  Head: Normocephalic and atraumatic.  Eyes: Conjunctivae and EOM are normal. Pupils are equal, round, and reactive to light.  Neck: Normal range of motion. Neck supple.  Cardiovascular: Normal rate and regular rhythm.   Respiratory: Effort normal   and breath sounds normal.  GI: Soft. Bowel sounds are normal. She exhibits no distension and no mass. There is no tenderness. There is no rebound and no guarding.  Musculoskeletal: Normal range of motion.  Neurological: She is alert and oriented to person, place, and time.  Skin: Skin is warm and dry.  Psychiatric: She has a normal mood and affect. Her behavior is normal. Judgment and thought content normal.    Assessment/Plan: 1) Iron deficiency anemia with melena in a patient with GAVE/PHG from [facility]: Will proceed with an EGD tomorrow as discussed with Dr. Pyrtle. Will check coags today.  2) Autoimmune hepatitis-patient claims she does not take any medication for this.    3) Chronic constipation.    4) GERD on PPI's.  5) Diverticulosis and history of colonic polyps.  6) Major depression.  7) COPD.  8) DM.  9) HTN/CHF.  10) Aortic stenosis.  11) Morbid obesity.  Talyn Eddie 05/23/2013, 1:27 PM      

## 2013-05-23 NOTE — Progress Notes (Signed)
[facility]                                                                                Patient Demographics  [name], is a 72 y.o. female, DOB - March 15, 1941, OZH:[id number]  Admit date - 05/22/2013   Admitting Physician Ron Parker, MD  Outpatient Primary MD for the patient is Michele Mcalpine, MD  LOS - 1   Chief Complaint  Patient presents with  . Shortness of Breath        Assessment & Plan  1.  Anemia, Possibly secondary to Upper GI Bleed  -Patient complained of melena fecal occult test was positive. Her current hemoglobin is 7.8 from 7.0. Patient does state that she has had melanotic stools as well as some bleeding that she's noticed.  Gastroenterology, Dr. Loreta Ave, has been consultative. Patient may have endoscopy today. Patient is n.p.o. at this time.  Continue IV Protonix.  Patient has been transfused one unit of blood. Continue to monitor her H&H.  2. SOB (shortness of breath)  Shortness of breath is likely multifactorial. Patient does have a history of COPD and CHF. As well as his current anemia. We'll continue to monitor. Is also with oxygen.  3.  HYPERTENSION  Patient is currently hypotensive normotensive. With holding parameters on her medications.  4.  DM  -Will continue sliding insulin scale with Lantus coverage. Although patient is n.p.o. at this time.  5.  COPD   Continue albuterol inhaler as well as oxygen supplementation as needed.    6. Autoimmune hepatitis, Chronic  7.  Other pancytopenia  -Likely due to her autoimmune hepatitis as well as anemia due to blood loss.    8. Congestive heart failure, unspecified  Will continue to monitor closely.  Patient appears euvolemic at this time.  Will continue lasix.   9.  AORTIC STENOSIS, chronic     Code Status: Full  Family Communication: None  Disposition Plan: Admitted   Procedures Possible endoscopy today   Consults  Dr. Loreta Ave, Gastroenterology   DVT Prophylaxis  SCDs  Lab  Results  Component Value Date   PLT 80* 05/22/2013    Medications  Scheduled Meds: . atenolol  50 mg Oral Daily  . buPROPion  150 mg Oral Daily  . furosemide  40 mg Oral BID  . insulin glargine  100 Units Subcutaneous BID  . lisinopril  5 mg Oral Daily  . mometasone-formoterol  2 puff Inhalation BID  . pantoprazole (PROTONIX) IV  40 mg Intravenous Q24H  . potassium chloride SA  10 mEq Oral QODAY  . QUEtiapine  400 mg Oral QHS  . rOPINIRole  4 mg Oral QHS  . simvastatin  40 mg Oral QHS  . sodium chloride  3 mL Intravenous Q12H  . venlafaxine XR  150 mg Oral Q breakfast  . [START ON 05/28/2013] Vitamin D (Ergocalciferol)  50,000 Units Oral Q7 days   Continuous Infusions: . sodium chloride 10 mL/hr at 05/23/13 0118   PRN Meds:.acetaminophen, acetaminophen, albuterol, ALPRAZolam, alum & mag hydroxide-simeth, HYDROmorphone (DILAUDID) injection, ondansetron (ZOFRAN) IV, ondansetron, oxyCODONE, polyethylene glycol, simethicone, zolpidem  Antibiotics    Anti-infectives   None  Time Spent in minutes   35 minutes   [name] D.O. on 05/23/2013 at 7:17 AM  Between 7am to 7pm - Pager - [phone]  After 7pm go to [email or web address] - password [email or web address]  And look for the night coverage person covering for me after hours  [facility] Office  [phone]    Subjective:   [name] was seen examined. She states her shortness of breath has not improving.  She currently denies any abdominal pain but continues to have some nausea. Denies any current vomiting. Patient does state that she does see bright red blood vessels very dark stools I would going to the bathroom.  She denies any diarrhea at this time. Output states that she was taking ciprofloxacin approximately 2 weeks ago for a kidney infection as well as doxycycline 1 week ago, which she recently completed.  At this time she denies any dizziness, chest pain, palpitations, constipation, or  diarrhea.  Objective:   Filed Vitals:   05/23/13 0302 05/23/13 0330 05/23/13 0430 05/23/13 0530  BP: 111/45 99/46 116/53 96/33  Pulse: 58 59 62 58  Temp: 97.9 F (36.6 C) 98.5 F (36.9 C) 98.4 F (36.9 C) 98.4 F (36.9 C)  TempSrc: Oral Oral Oral Oral  Resp: 20 16 18 18   Height:      Weight:      SpO2: 100%       Wt Readings from Last 3 Encounters:  05/23/13 101.1 kg (222 lb 14.2 oz)  05/12/13 99.428 kg (219 lb 3.2 oz)  05/11/13 99.338 kg (219 lb)     Intake/Output Summary (Last 24 hours) at 05/23/13 0717 Last data filed at 05/23/13 0400  Gross per 24 hour  Intake    740 ml  Output    225 ml  Net    515 ml    Exam  General: Well developed, well nourished, NAD, appears stated age  HEENT: NCAT, PERRLA, EOMI, Anicteic Sclera, mucous membranes moist. No pharyngeal erythema or exudates  Neck: Supple, no JVD, no masses,  Cardiovascular: S1 S2 auscultated, 2/6 SEM. Regular rate and rhythm.  Respiratory: Clear to auscultation bilaterally with equal chest rise  Abdomen: Soft, obese, non-tender to palpation, nondistended, + bowel sounds  Extremities: warm dry without cyanosis clubbing or edema  Neuro: AAOx3, cranial nerves grossly intact.   Skin: Without rashes exudates or nodules  Psych: Normal affect and demeanor with intact judgement and insight    Data Review   Micro Results No results found for this or any previous visit (from the past 240 hour(s)).  Radiology Reports Dg Chest 2 View  05/22/2013   *RADIOLOGY REPORT*  Clinical Data: Shortness of breath.  CHEST - 2 VIEW  Comparison: 05/10/2013 and prior chest radiographs  Findings: The cardiomediastinal silhouette is unremarkable. Mild peribronchial thickening is stable. Lingular scarring is again noted. There is no evidence of focal airspace disease, pulmonary edema, suspicious pulmonary nodule/mass, pleural effusion, or pneumothorax. No acute bony abnormalities are identified.  IMPRESSION: No evidence of  acute cardiopulmonary disease.   Original Report Authenticated By: [name] Pier, M.D.   Dg Chest 2 View  05/10/2013   CLINICAL DATA:  Shortness of Breath. Cough.  EXAM: CHEST  2 VIEW  COMPARISON:  04/24/2013  FINDINGS: Cardiomegaly noted with mild airway thickening. Stable scarring in the left mid lung. No significant pleural effusion.  IMPRESSION: 1. Stable cardiomegaly with stable lingular scarring. 2. Airway thickening is present, suggesting bronchitis or reactive airways disease.   Electronically Signed  By: Herbie Baltimore   On: 05/10/2013 12:07   Dg Chest Port 1 View  04/24/2013   *RADIOLOGY REPORT*  Clinical Data: Short of breath.  PORTABLE CHEST - 1 VIEW  Comparison: 03/22/2013.  Findings: Cardiomegaly is present with interstitial pulmonary edema.  Vascular congestion. Monitoring leads are projected over the chest.  Mediastinal contours appears similar to prior. Prominence of the right hilum is chronic.  IMPRESSION: Mild CHF with interstitial pulmonary edema.   Original Report Authenticated By: Andreas Newport, M.D.    CBC  Recent Labs Lab 05/22/13 2209 05/23/13 0104  WBC 3.1*  --   HGB 7.7* 7.0*  HCT 23.4* 21.1*  PLT 80*  --   MCV 98.3  --   MCH 32.4  --   MCHC 32.9  --   RDW 15.3  --   LYMPHSABS 0.4*  --   MONOABS 0.3  --   EOSABS 0.1  --   BASOSABS 0.0  --     Chemistries   Recent Labs Lab 05/17/13 1232 05/22/13 2209  NA 134* 133*  K 4.5 4.4  CL 104 99  CO2 24 24  GLUCOSE 244* 376*  BUN 41* 42*  CREATININE 1.5* 1.16*  CALCIUM 8.7 8.3*  AST  --  41*  ALT  --  41*  ALKPHOS  --  133*  BILITOT  --  0.6   ------------------------------------------------------------------------------------------------------------------ estimated creatinine clearance is 53 ml/min (by C-G formula based on Cr of 1.16). ------------------------------------------------------------------------------------------------------------------ No results found for this basename: HGBA1C,  in  the last 72 hours ------------------------------------------------------------------------------------------------------------------ No results found for this basename: CHOL, HDL, LDLCALC, TRIG, CHOLHDL, LDLDIRECT,  in the last 72 hours ------------------------------------------------------------------------------------------------------------------ No results found for this basename: TSH, T4TOTAL, FREET3, T3FREE, THYROIDAB,  in the last 72 hours ------------------------------------------------------------------------------------------------------------------ No results found for this basename: VITAMINB12, FOLATE, FERRITIN, TIBC, IRON, RETICCTPCT,  in the last 72 hours  Coagulation profile No results found for this basename: INR, PROTIME,  in the last 168 hours  No results found for this basename: DDIMER,  in the last 72 hours  Cardiac Enzymes  Recent Labs Lab 05/22/13 2209  TROPONINI <0.30   ------------------------------------------------------------------------------------------------------------------ No components found with this basename: POCBNP,

## 2013-05-23 NOTE — ED Notes (Signed)
No changes.  pt alert, NAD, calm, interactive, resps e/u, speaking in clear complete sentences, mentions some stomach discomfort, 4/10. Also some nausea. Denies sob "while at rest", reports "dark/ black stool for years, sob for years". Dr. Lovell Sheehan at [facility].

## 2013-05-23 NOTE — ED Notes (Signed)
Pt given ice.  

## 2013-05-24 ENCOUNTER — Encounter (HOSPITAL_COMMUNITY)
Admission: EM | Disposition: A | Discharge status: Home / Self Care | Payer: Self-pay | Source: Home / Self Care | Attending: Internal Medicine

## 2013-05-24 ENCOUNTER — Encounter (HOSPITAL_COMMUNITY): Payer: Self-pay | Admitting: *Deleted

## 2013-05-24 DIAGNOSIS — I2510 Atherosclerotic heart disease of native coronary artery without angina pectoris: Secondary | ICD-10-CM

## 2013-05-24 DIAGNOSIS — D500 Iron deficiency anemia secondary to blood loss (chronic): Secondary | ICD-10-CM

## 2013-05-24 DIAGNOSIS — K5520 Angiodysplasia of colon without hemorrhage: Secondary | ICD-10-CM

## 2013-05-24 DIAGNOSIS — K219 Gastro-esophageal reflux disease without esophagitis: Secondary | ICD-10-CM

## 2013-05-24 HISTORY — PX: ESOPHAGOGASTRODUODENOSCOPY: SHX5428

## 2013-05-24 HISTORY — PX: HOT HEMOSTASIS: SHX5433

## 2013-05-24 LAB — GLUCOSE, CAPILLARY
Glucose-Capillary: 79 mg/dL (ref 70–99)
Glucose-Capillary: 97 mg/dL (ref 70–99)
Glucose-Capillary: 195 mg/dL — ABNORMAL HIGH (ref 70–99)

## 2013-05-24 LAB — CBC
Hemoglobin: 7.8 g/dL — ABNORMAL LOW (ref 12.0–15.0)
MCH: 31.3 pg (ref 26.0–34.0)
MCHC: 32.4 g/dL (ref 30.0–36.0)
MCV: 96.8 fL (ref 78.0–100.0)
Platelets: 72 10*3/uL — ABNORMAL LOW (ref 150–400)
RBC: 2.49 MIL/uL — ABNORMAL LOW (ref 3.87–5.11)

## 2013-05-24 LAB — BASIC METABOLIC PANEL
CO2: 26 mEq/L (ref 19–32)
Calcium: 8.8 mg/dL (ref 8.4–10.5)
Creatinine, Ser: 1.33 mg/dL — ABNORMAL HIGH (ref 0.50–1.10)
GFR calc Af Amer: 46 mL/min — ABNORMAL LOW (ref >90)
GFR calc non Af Amer: 40 mL/min — ABNORMAL LOW (ref >90)
Glucose, Bld: 151 mg/dL — ABNORMAL HIGH (ref 70–99)

## 2013-05-24 SURGERY — EGD (ESOPHAGOGASTRODUODENOSCOPY)
Anesthesia: Moderate Sedation

## 2013-05-24 MED ORDER — FENTANYL CITRATE 0.05 MG/ML IJ SOLN
INTRAMUSCULAR | Status: AC
  Filled 2013-05-24: qty 2

## 2013-05-24 MED ORDER — MIDAZOLAM HCL 5 MG/ML IJ SOLN
INTRAMUSCULAR | Status: AC
  Filled 2013-05-24: qty 2

## 2013-05-24 MED ORDER — NADOLOL 20 MG PO TABS
20.0000 mg | ORAL_TABLET | Freq: Every day | ORAL | Status: DC
  Administered 2013-05-24 – 2013-05-26 (×3): 20 mg via ORAL
  Filled 2013-05-24 (×3): qty 1

## 2013-05-24 MED ORDER — BUTAMBEN-TETRACAINE-BENZOCAINE 2-2-14 % EX AERO
INHALATION_SPRAY | CUTANEOUS | Status: DC | PRN
  Administered 2013-05-24: 2 via TOPICAL

## 2013-05-24 MED ORDER — FENTANYL CITRATE 0.05 MG/ML IJ SOLN
INTRAMUSCULAR | Status: DC | PRN
  Administered 2013-05-24 (×2): 25 ug via INTRAVENOUS

## 2013-05-24 MED ORDER — PANTOPRAZOLE SODIUM 40 MG PO TBEC
40.0000 mg | DELAYED_RELEASE_TABLET | Freq: Two times a day (BID) | ORAL | Status: DC
  Administered 2013-05-24 – 2013-05-26 (×6): 40 mg via ORAL
  Filled 2013-05-24 (×6): qty 1

## 2013-05-24 MED ORDER — SUCRALFATE 1 GM/10ML PO SUSP
1.0000 g | Freq: Three times a day (TID) | ORAL | Status: DC
  Administered 2013-05-24 – 2013-05-26 (×10): 1 g via ORAL
  Filled 2013-05-24 (×12): qty 10

## 2013-05-24 MED ORDER — MIDAZOLAM HCL 10 MG/2ML IJ SOLN
INTRAMUSCULAR | Status: DC | PRN
  Administered 2013-05-24: 1 mg via INTRAVENOUS
  Administered 2013-05-24 (×2): 2 mg via INTRAVENOUS

## 2013-05-24 MED ORDER — DIPHENHYDRAMINE HCL 50 MG/ML IJ SOLN
INTRAMUSCULAR | Status: DC | PRN
  Administered 2013-05-24: 12.5 mg via INTRAVENOUS

## 2013-05-24 MED ORDER — DIPHENHYDRAMINE HCL 50 MG/ML IJ SOLN
INTRAMUSCULAR | Status: AC
  Filled 2013-05-24: qty 1

## 2013-05-24 NOTE — Clinical Documentation Improvement (Signed)
THIS DOCUMENT IS NOT A PERMANENT PART OF THE MEDICAL RECORD  Please update your documentation with the medical record to reflect your response to this query. If you need help knowing how to do this please call [phone].  05/24/13  Dr. Catha Gosselin,  In a better effort to capture your patient's severity of illness, reflect appropriate length of stay and utilization of resources, a review of the patient medical record has revealed the following information:  "CHF" documented in H&P and progress notes   - Echo 02/18/13 Study Conclusions - Left ventricle: The cavity size was mildly dilated. Wall thickness was normal. Systolic function was normal. The estimated ejection fraction was in the range of 55% to 60%. Wall motion was normal; there were no regional wall motion abnormalities. Features are consistent with a pseudonormal left ventricular filling pattern, with concomitant abnormal relaxation and increased filling pressure (grade 2 diastolic dysfunction). - Aortic valve: There was very mild stenosis. Mild regurgitation. Valve area: 2.93cm^2(VTI). Valve area: 2.89cm^2 (Vmax). - Mitral valve: Calcified annulus. - Left atrium: The atrium was mildly dilated. - Right atrium: The atrium was mildly dilated. - Pulmonary arteries: Systolic pressure was mildly increased. PA peak pressure: 39mm Hg (S).    Based on your clinical judgment and evidence of a comorbid condition, please document the ACUITY and TYPE of CHF requiring monitoring this admission:  ACUITY:  - Acute  - Chronic  - Acute on Chronic  AND  TYPE:  - Systolic  - Diastolic  - Combined    In responding to this query please exercise your independent judgment.    The fact that a query is asked, does not imply that any particular answer is desired or expected.   Reviewed: additional documentation in the medical record  Thank Bonita Quin  Jerral Ralph   [phone] Health Information Management   I have changed  the progress note.  Thanks.

## 2013-05-24 NOTE — Op Note (Signed)
[facility] [address] [place], [address]   ENDOSCOPY PROCEDURE REPORT  PATIENT: [name], [name]  MR#: [id number] BIRTHDATE: [birth year] , 71  yrs. old GENDER: Female ENDOSCOPIST: Beverley Fiedler, MD REFERRED BY:  [facility] PROCEDURE DATE:  05/24/2013 PROCEDURE:  EGD w/ ablation , EGD w/ biopsy , and EGD w/ control of bleeding ASA CLASS:     Class III INDICATIONS:  Iron deficiency anemia. MEDICATIONS: These medications were titrated to patient response per physician's verbal order, Benadryl 12.5 mg IV, Fentanyl 50 mcg IV, and Versed 5 mg IV TOPICAL ANESTHETIC: Cetacaine Spray  DESCRIPTION OF PROCEDURE: After the risks benefits and alternatives of the procedure were thoroughly explained, informed consent was obtained.  The Pentax Gastroscope [id number] endoscope was introduced through the mouth and advanced to the second portion of the duodenum. Without limitations.  The instrument was slowly withdrawn as the mucosa was fully examined.        ESOPHAGUS: The mucosa of the esophagus appeared normal.   No esophageal varices seen  STOMACH: A small hiatus hernia was found.  There was gastropathy characterized by erythema in the gastric cardia including the hernia sac, which is felt to be mild portal hypertensive gastropathy versus gastropathy associated with hiatus hernia. No Cameron's lesions seen.  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  In the very distal antrum, prepyloric and pyloric on the greater curvature side there was a cluster of nodules/nodular mucosa with associated erythema.  Biopsies from this location were performed to exclude dysplasia.  Etiology unclear, query hyperplastic gastric polyps versus granulation tissue from previous APC ablation. Multiple biopsies were performed using cold forceps.  Sample sent for histology. The flat areas of GAVE were treated with APC, 40W, 1L/min, with success.  The  stomach was suctioned appropriately throughout the procedure to avoid overdistention.     Retroflexion revealed the previously mentioned hiatal hernia and gastropathy.  DUODENUM:  The duodenal bulb and the examined portions of the second duodenum were unremarkable, other than very faint erythema without erosion, ulceration, or distinct angioectasia.  The scope was then withdrawn from the patient and the procedure completed.   COMPLICATIONS: There were no complications.  ENDOSCOPIC IMPRESSION: 1.   The mucosa of the esophagus appeared normal.  No esophageal varices 2.   Hiatus hernia was found with gastropathy 3.   GAVE, treated with APC 4.   Nodular antral and prepyloric mucosa, multiple biopsies (see above)  RECOMMENDATIONS: 1.  Continue twice-daily PPI 2.  Add Carafate 1 g 4 times daily 3.  Change atenolol to nonselective beta blocker for better reduction portal pressures (nadolol or propranolol), titrated to a resting heart rate of 60 beats per minute as tolerated by blood pressure 4.  Continue to monitor hemoglobin and iron studies.  Transfuse if necessary 5.  Office followup with Dr.  Juanda Chance  eSigned:  Beverley Fiedler, MD 05/24/2013 10:06 AM   CC:The Patient and [name], MD  PATIENT NAME:  Tayen, Narang MR#: [id number]

## 2013-05-24 NOTE — Progress Notes (Signed)
Patient has returned from Endoscopy; will continue to monitor patient. Lorretta Harp RN

## 2013-05-24 NOTE — H&P (View-Only) (Signed)
Cross cover for [facility] Reason for Consult: Melena and anemia. Referring Physician: THP  [name] is an 72 y.o. female.  HPI:  72 year old female, with multiple medical problems listed below, followed by Dr. 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 She apparently was in her USOH till 2 days PTA when she started noticing melenic stools and has worsening of her exertional dysnea. She claims she has had melena off and on for several years and has had multiple transfusions and endoscopies for her gastric ectasias. She denies having any melena. She is slightly nauseated but has not vomited. She has a fairly good appetite and her weight ahs been stable. She is on iron for chronic IDA.     Past Medical History  Diagnosis Date  . Chronic airway obstruction, not elsewhere classified   . Unspecified chronic bronchitis   . Unspecified essential hypertension   . Coronary atherosclerosis of unspecified type of vessel, native or graft   . Palpitations   . Aortic valve disorders   . Pure hypercholesterolemia   . Morbid obesity   . Esophageal reflux   . Angiodysplasia of intestine (without mention of hemorrhage)   . Diverticulosis of colon (without mention of hemorrhage)   . Benign neoplasm of colon   . Autoimmune hepatitis   . Cystitis, unspecified   . Osteoarthrosis, unspecified whether generalized or localized, unspecified site   . Osteoporosis, unspecified   . Restless legs syndrome (RLS)   . Major depressive disorder, recurrent episode, severe, specified as with psychotic behavior   . Iron deficiency anemia secondary to blood loss (chronic)   . Coarse tremors     "just in my arms"  . Carpal tunnel syndrome on both sides   . Dysrhythmia     palpitations  . CHF (congestive heart failure)   . Asthma   . Shortness of breath     "any time really"  . Sleep apnea     "but I don't use the equipment"  . Type II diabetes mellitus    . Blood transfusion   . H/O hiatal hernia   . 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 Adenomatous colon polyp   . Anginal pain   . Anxiety   . Pneumonia   . Heart murmur   . AVM (arteriovenous malformation)   . Esophageal varices   . Portal hypertensive gastropathy   . Falls frequently   . UTI (lower urinary tract infection)    Past Surgical History  Procedure Laterality Date  . Hemorroidectomy    . Appendectomy    . Vaginal hysterectomy    . Breast biopsy      bilateral  . Cesarean section      x 3  . Appendectomy    . Esophagogastroduodenoscopy  06/12/2012    Procedure: ESOPHAGOGASTRODUODENOSCOPY (EGD);  Surgeon: Hart Carwin, MD;  Location: [facility];  Service: Endoscopy;  Laterality: N/A;  . Balloon dilation  06/12/2012    Procedure: BALLOON DILATION;  Surgeon: Hart Carwin, MD;  Location: [facility] ENDOSCOPY;  Service: Endoscopy;  Laterality: N/A;  ?balloon  . Esophagogastroduodenoscopy  09/10/2012    Procedure: ESOPHAGOGASTRODUODENOSCOPY (EGD);  Surgeon: Hart Carwin, MD;  Location: [facility];  Service: Endoscopy;  Laterality: N/A;  . Hot hemostasis  09/10/2012    Procedure: HOT HEMOSTASIS (ARGON PLASMA COAGULATION/BICAP);  Surgeon: Hart Carwin, MD;  Location: [facility];  Service: Endoscopy;  Laterality:  N/A;  . Esophagogastroduodenoscopy N/A 01/18/2013    Procedure: ESOPHAGOGASTRODUODENOSCOPY (EGD);  Surgeon: Hart Carwin, MD;  Location: [facility] ENDOSCOPY;  Service: Endoscopy;  Laterality: N/A;   Family History  Problem Relation Age of Onset  . Heart disease Mother   . Cervical cancer Mother   . Kidney disease Mother   . Diabetes Mother   . Breast cancer Sister   . Multiple sclerosis Sister   . Colon cancer Neg Hx   . Breast cancer Maternal Aunt     x 2  . Diabetes Father   . Diabetes Sister   . Multiple sclerosis Other    Social History:  reports that she quit smoking about 20 years ago. Her smoking use included Cigarettes. She has a 37.5 pack-year smoking  history. She has never used smokeless tobacco. She reports that she does not drink alcohol or use illicit drugs.  Allergies:  Allergies  Allergen Reactions  . Aspirin Other (See Comments)    Causes nosebleeds  . Penicillins Itching  . Theophylline Nausea And Vomiting   Medications: I have reviewed the patient's current medications.  Results for orders placed during the hospital encounter of 05/22/13 (from the past 48 hour(s))  PRO B NATRIURETIC PEPTIDE     Status: Abnormal   Collection Time    05/22/13 10:09 PM      Result Value Range   Pro B Natriuretic peptide (BNP) 155.3 (*) 0 - 125 pg/mL  CBC WITH DIFFERENTIAL     Status: Abnormal   Collection Time    05/22/13 10:09 PM      Result Value Range   WBC 3.1 (*) 4.0 - 10.5 K/uL   RBC 2.38 (*) 3.87 - 5.11 MIL/uL   Hemoglobin 7.7 (*) 12.0 - 15.0 g/dL   HCT 16.1 (*) 09.6 - 04.5 %   MCV 98.3  78.0 - 100.0 fL   MCH 32.4  26.0 - 34.0 pg   MCHC 32.9  30.0 - 36.0 g/dL   RDW 40.9  81.1 - 91.4 %   Platelets 80 (*) 150 - 400 K/uL   Comment: CONSISTENT WITH PREVIOUS RESULT   Neutrophils Relative % 74  43 - 77 %   Neutro Abs 2.3  1.7 - 7.7 K/uL   Lymphocytes Relative 13  12 - 46 %   Lymphs Abs 0.4 (*) 0.7 - 4.0 K/uL   Monocytes Relative 9  3 - 12 %   Monocytes Absolute 0.3  0.1 - 1.0 K/uL   Eosinophils Relative 3  0 - 5 %   Eosinophils Absolute 0.1  0.0 - 0.7 K/uL   Basophils Relative 0  0 - 1 %   Basophils Absolute 0.0  0.0 - 0.1 K/uL  COMPREHENSIVE METABOLIC PANEL     Status: Abnormal   Collection Time    05/22/13 10:09 PM      Result Value Range   Sodium 133 (*) 135 - 145 mEq/L   Potassium 4.4  3.5 - 5.1 mEq/L   Chloride 99  96 - 112 mEq/L   CO2 24  19 - 32 mEq/L   Glucose, Bld 376 (*) 70 - 99 mg/dL   BUN 42 (*) 6 - 23 mg/dL   Creatinine, Ser 7.82 (*) 0.50 - 1.10 mg/dL   Calcium 8.3 (*) 8.4 - 10.5 mg/dL   Total Protein 5.7 (*) 6.0 - 8.3 g/dL   Albumin 2.7 (*) 3.5 - 5.2 g/dL   AST 41 (*) 0 - 37 U/L   ALT 41 (*)  0 - 35 U/L    Alkaline Phosphatase 133 (*) 39 - 117 U/L   Total Bilirubin 0.6  0.3 - 1.2 mg/dL   GFR calc non Af Amer 47 (*) >90 mL/min   GFR calc Af Amer 54 (*) >90 mL/min   Comment: (NOTE)     The eGFR has been calculated using the CKD EPI equation.     This calculation has not been validated in all clinical situations.     eGFR's persistently <90 mL/min signify possible Chronic Kidney     Disease.  TROPONIN I     Status: None   Collection Time    05/22/13 10:09 PM      Result Value Range   Troponin I <0.30  <0.30 ng/mL   Comment:            Due to the release kinetics of cTnI,     a negative result within the first hours     of the onset of symptoms does not rule out     myocardial infarction with certainty.     If myocardial infarction is still suspected,     repeat the test at appropriate intervals.  OCCULT BLOOD, POC DEVICE     Status: Abnormal   Collection Time    05/22/13 11:23 PM      Result Value Range   Fecal Occult Bld POSITIVE (*) NEGATIVE  PREPARE RBC (CROSSMATCH)     Status: None   Collection Time    05/22/13 11:59 PM      Result Value Range   Order Confirmation ORDER PROCESSED BY BLOOD BANK    TYPE AND SCREEN     Status: None   Collection Time    05/23/13 12:02 AM      Result Value Range   ABO/RH(D) O POS     Antibody Screen POS     Sample Expiration 05/26/2013     Antibody Identification ANTI-E     DAT, IgG NEG     Unit Number [id number]     Blood Component Type RED CELLS,LR     Unit division 00     Status of Unit ALLOCATED     Transfusion Status OK TO TRANSFUSE     Crossmatch Result COMPATIBLE     Donor AG Type NEGATIVE FOR E ANTIGEN     Unit Number [id number]     Blood Component Type RBC LR PHER2     Unit division 00     Status of Unit ISSUED     Transfusion Status OK TO TRANSFUSE     Crossmatch Result COMPATIBLE     Donor AG Type NEGATIVE FOR E ANTIGEN    HEMOGLOBIN AND HEMATOCRIT, BLOOD     Status: Abnormal   Collection Time    05/23/13  1:04 AM       Result Value Range   Hemoglobin 7.0 (*) 12.0 - 15.0 g/dL   HCT 30.8 (*) 65.7 - 84.6 %  GLUCOSE, CAPILLARY     Status: Abnormal   Collection Time    05/23/13  1:51 AM      Result Value Range   Glucose-Capillary 244 (*) 70 - 99 mg/dL   Comment 1 Notify RN     Comment 2 Documented in Chart    BASIC METABOLIC PANEL     Status: Abnormal   Collection Time    05/23/13  6:40 AM      Result Value Range   Sodium 137  135 - 145 mEq/L  Potassium 3.8  3.5 - 5.1 mEq/L   Chloride 102  96 - 112 mEq/L   CO2 27  19 - 32 mEq/L   Glucose, Bld 211 (*) 70 - 99 mg/dL   BUN 44 (*) 6 - 23 mg/dL   Creatinine, Ser 2.95 (*) 0.50 - 1.10 mg/dL   Calcium 8.5  8.4 - 62.1 mg/dL   GFR calc non Af Amer 43 (*) >90 mL/min   GFR calc Af Amer 50 (*) >90 mL/min   Comment: (NOTE)     The eGFR has been calculated using the CKD EPI equation.     This calculation has not been validated in all clinical situations.     eGFR's persistently <90 mL/min signify possible Chronic Kidney     Disease.  CBC     Status: Abnormal   Collection Time    05/23/13  6:40 AM      Result Value Range   WBC 3.4 (*) 4.0 - 10.5 K/uL   RBC 2.46 (*) 3.87 - 5.11 MIL/uL   Hemoglobin 7.8 (*) 12.0 - 15.0 g/dL   HCT 30.8 (*) 65.7 - 84.6 %   MCV 96.7  78.0 - 100.0 fL   MCH 31.7  26.0 - 34.0 pg   MCHC 32.8  30.0 - 36.0 g/dL   RDW 96.2 (*) 95.2 - 84.1 %   Platelets 77 (*) 150 - 400 K/uL   Comment: CONSISTENT WITH PREVIOUS RESULT  GLUCOSE, CAPILLARY     Status: Abnormal   Collection Time    05/23/13  7:28 AM      Result Value Range   Glucose-Capillary 176 (*) 70 - 99 mg/dL   Comment 1 Documented in Chart     Comment 2 Notify RN    GLUCOSE, CAPILLARY     Status: Abnormal   Collection Time    05/23/13 11:39 AM      Result Value Range   Glucose-Capillary 160 (*) 70 - 99 mg/dL   Comment 1 Documented in Chart     Comment 2 Notify RN     Dg Chest 2 View  05/22/2013   *RADIOLOGY REPORT*  Clinical Data: Shortness of breath.  CHEST - 2  VIEW  Comparison: 05/10/2013 and prior chest radiographs  Findings: The cardiomediastinal silhouette is unremarkable. Mild peribronchial thickening is stable. Lingular scarring is again noted. There is no evidence of focal airspace disease, pulmonary edema, suspicious pulmonary nodule/mass, pleural effusion, or pneumothorax. No acute bony abnormalities are identified.  IMPRESSION: No evidence of acute cardiopulmonary disease.   Original Report Authenticated By: [name] Pier, M.D.   Review of Systems  Constitutional: Positive for malaise/fatigue. Negative for weight loss.  HENT: Negative.   Eyes: Negative.   Respiratory: Negative.   Cardiovascular: Negative.   Gastrointestinal: Positive for heartburn, nausea, constipation, blood in stool and melena. Negative for vomiting and abdominal pain.  Skin: Negative.   Neurological: Positive for weakness.  Endo/Heme/Allergies: Negative.   Psychiatric/Behavioral: Positive for depression.   Blood pressure 122/49, pulse 58, temperature 98.4 F (36.9 C), temperature source Oral, resp. rate 18, height 5\' 4"  (1.626 m), weight 101.1 kg (222 lb 14.2 oz), SpO2 94.00%. Physical Exam  Constitutional: She is oriented to person, place, and time. She appears well-developed and well-nourished.  HENT:  Head: Normocephalic and atraumatic.  Eyes: Conjunctivae and EOM are normal. Pupils are equal, round, and reactive to light.  Neck: Normal range of motion. Neck supple.  Cardiovascular: Normal rate and regular rhythm.   Respiratory: Effort normal  and breath sounds normal.  GI: Soft. Bowel sounds are normal. She exhibits no distension and no mass. There is no tenderness. There is no rebound and no guarding.  Musculoskeletal: Normal range of motion.  Neurological: She is alert and oriented to person, place, and time.  Skin: Skin is warm and dry.  Psychiatric: She has a normal mood and affect. Her behavior is normal. Judgment and thought content normal.    Assessment/Plan: 1) Iron deficiency anemia with melena in a patient with GAVE/PHG from [facility]: Will proceed with an EGD tomorrow as discussed with Dr. Rhea Belton. Will check coags today.  2) Autoimmune hepatitis-patient claims she does not take any medication for this.    3) Chronic constipation.    4) GERD on PPI's.  5) Diverticulosis and history of colonic polyps.  6) Major depression.  7) COPD.  8) DM.  9) HTN/CHF.  10) Aortic stenosis.  11) Morbid obesity.  [name] 05/23/2013, 1:27 PM

## 2013-05-24 NOTE — Progress Notes (Signed)
[facility]                                                                                Patient Demographics  [name], is a 72 y.o. female, DOB - [birth year], ZOX:[id number]  Admit date - 05/22/2013   Admitting Physician [name] Parker, MD  Outpatient Primary MD for the patient is [name] Mcalpine, MD  LOS - 2   Chief Complaint  Patient presents with  . Shortness of Breath        Assessment & Plan  1.  Anemia, Possibly secondary to Upper GI Bleed  Current Hb 7.8.  (Patient received one unit RBCs at admission)  Gastrolenterology planning EGD today.    Continue IV Protonix.  Will continue to monitor H&H.   Pending further recommendations from GI once EGD is conducted.   2. SOB (shortness of breath)  Shortness of breath is likely multifactorial. Patient does have a history of COPD and CHF, complicated by anemia.    Continue supplemental O2 and monitoring.  3.  HYPERTENSION  Patient is currently hypotensive normotensive. With holding parameters on her medications.  4.  DM  -Will continue sliding insulin scale with Lantus coverage. Although patient is n.p.o. at this time.  5.  COPD   Continue albuterol inhaler as well as oxygen supplementation as needed.    6. Autoimmune hepatitis, Chronic  7.  Other pancytopenia  -Likely due to her autoimmune hepatitis as well as anemia due to blood loss.    8. Congestive heart failure, unspecified  Will continue to monitor closely.  Patient appears euvolemic at this time.  Will continue lasix.   9.  AORTIC STENOSIS, chronic  10. Acute Renal failure  Cr is trending upward to 1.33 today.  Will continue to monitor.  May be secondary to volume status.  If not improved will order urine electrolytes and ultrasound.   Code Status: Full  Family Communication: None  Disposition Plan: Admitted   Procedures Possible endoscopy today   Consults  [name], Gastroenterology   DVT Prophylaxis  SCDs  Lab Results   Component Value Date   PLT 72* 05/24/2013    Medications  Scheduled Meds: . atenolol  50 mg Oral Daily  . buPROPion  150 mg Oral Daily  . furosemide  40 mg Oral BID  . insulin glargine  100 Units Subcutaneous BID  . lisinopril  5 mg Oral Daily  . mometasone-formoterol  2 puff Inhalation BID  . pantoprazole (PROTONIX) IV  40 mg Intravenous Q24H  . potassium chloride SA  10 mEq Oral QODAY  . QUEtiapine  400 mg Oral QHS  . rOPINIRole  4 mg Oral QHS  . simvastatin  40 mg Oral QHS  . sodium chloride  3 mL Intravenous Q12H  . venlafaxine XR  150 mg Oral Q breakfast  . [START ON 05/28/2013] Vitamin D (Ergocalciferol)  50,000 Units Oral Q7 days   Continuous Infusions: . sodium chloride 10 mL/hr at 05/23/13 0118   PRN Meds:.acetaminophen, acetaminophen, albuterol, ALPRAZolam, alum & mag hydroxide-simeth, HYDROmorphone (DILAUDID) injection, ondansetron (ZOFRAN) IV, ondansetron, oxyCODONE, polyethylene glycol, simethicone, zolpidem  Antibiotics    Anti-infectives   None  Time Spent in minutes   35 minutes   [name] D.O. on 05/24/2013 at 7:07 AM  Between 7am to 7pm - Pager - [phone]  After 7pm go to [email or web address] - password [email or web address]  And look for the night coverage person covering for me after hours  [facility] Office  [phone]    Subjective:   [name] was seen examined. She states her shortness of breath has not improving.  She currently denies any abdominal pain but continues to have some nausea. Denies any current vomiting. Patient does state that she does see bright red blood vessels very dark stools I would going to the bathroom.  She denies any diarrhea at this time. Output states that she was taking ciprofloxacin approximately 2 weeks ago for a kidney infection as well as doxycycline 1 week ago, which she recently completed.  At this time she denies any dizziness, chest pain, palpitations, constipation, or diarrhea.  Objective:    Filed Vitals:   05/23/13 2103 05/24/13 0215 05/24/13 0428 05/24/13 0445  BP:  125/49  113/53  Pulse:  61  55  Temp:  98 F (36.7 C)  98 F (36.7 C)  TempSrc:  Oral  Oral  Resp:  18  20  Height:      Weight:   100.1 kg (220 lb 10.9 oz)   SpO2: 98% 99%  100%    Wt Readings from Last 3 Encounters:  05/24/13 100.1 kg (220 lb 10.9 oz)  05/24/13 100.1 kg (220 lb 10.9 oz)  05/24/13 100.1 kg (220 lb 10.9 oz)     Intake/Output Summary (Last 24 hours) at 05/24/13 0707 Last data filed at 05/24/13 0214  Gross per 24 hour  Intake    480 ml  Output   3551 ml  Net  -3071 ml    Exam  General: Well developed, well nourished, NAD, appears stated age  HEENT: NCAT, PERRLA, EOMI, Anicteic Sclera, mucous membranes moist. No pharyngeal erythema or exudates  Neck: Supple, no JVD, no masses,  Cardiovascular: S1 S2 auscultated, 2/6 SEM. Regular rate and rhythm.  Respiratory: Clear to auscultation bilaterally with equal chest rise  Abdomen: Soft, obese, non-tender to palpation, nondistended, + bowel sounds  Extremities: warm dry without cyanosis clubbing or edema  Neuro: AAOx3, cranial nerves grossly intact.   Skin: Without rashes exudates or nodules  Psych: Normal affect and demeanor with intact judgement and insight    Data Review   Micro Results No results found for this or any previous visit (from the past 240 hour(s)).  Radiology Reports Dg Chest 2 View  05/22/2013   *RADIOLOGY REPORT*  Clinical Data: Shortness of breath.  CHEST - 2 VIEW  Comparison: 05/10/2013 and prior chest radiographs  Findings: The cardiomediastinal silhouette is unremarkable. Mild peribronchial thickening is stable. Lingular scarring is again noted. There is no evidence of focal airspace disease, pulmonary edema, suspicious pulmonary nodule/mass, pleural effusion, or pneumothorax. No acute bony abnormalities are identified.  IMPRESSION: No evidence of acute cardiopulmonary disease.   Original Report  Authenticated By: [name], M.D.   Dg Chest 2 View  05/10/2013   CLINICAL DATA:  Shortness of Breath. Cough.  EXAM: CHEST  2 VIEW  COMPARISON:  04/24/2013  FINDINGS: Cardiomegaly noted with mild airway thickening. Stable scarring in the left mid lung. No significant pleural effusion.  IMPRESSION: 1. Stable cardiomegaly with stable lingular scarring. 2. Airway thickening is present, suggesting bronchitis or reactive airways disease.   Electronically Signed   By: Lorelle Gibbs  Ova Freshwater   On: 05/10/2013 12:07   Dg Chest Port 1 View  04/24/2013   *RADIOLOGY REPORT*  Clinical Data: Short of breath.  PORTABLE CHEST - 1 VIEW  Comparison: 03/22/2013.  Findings: Cardiomegaly is present with interstitial pulmonary edema.  Vascular congestion. Monitoring leads are projected over the chest.  Mediastinal contours appears similar to prior. Prominence of the right hilum is chronic.  IMPRESSION: Mild CHF with interstitial pulmonary edema.   Original Report Authenticated By: Andreas Newport, M.D.    CBC  Recent Labs Lab 05/22/13 2209 05/23/13 0104 05/23/13 0640 05/23/13 1615 05/24/13 0345  WBC 3.1*  --  3.4*  --  3.0*  HGB 7.7* 7.0* 7.8* 8.4* 7.8*  HCT 23.4* 21.1* 23.8* 25.6* 24.1*  PLT 80*  --  77*  --  72*  MCV 98.3  --  96.7  --  96.8  MCH 32.4  --  31.7  --  31.3  MCHC 32.9  --  32.8  --  32.4  RDW 15.3  --  17.0*  --  16.5*  LYMPHSABS 0.4*  --   --   --   --   MONOABS 0.3  --   --   --   --   EOSABS 0.1  --   --   --   --   BASOSABS 0.0  --   --   --   --     Chemistries   Recent Labs Lab 05/17/13 1232 05/22/13 2209 05/23/13 0640 05/24/13 0345  NA 134* 133* 137 139  K 4.5 4.4 3.8 4.0  CL 104 99 102 102  CO2 24 24 27 26   GLUCOSE 244* 376* 211* 151*  BUN 41* 42* 44* 42*  CREATININE 1.5* 1.16* 1.24* 1.33*  CALCIUM 8.7 8.3* 8.5 8.8  AST  --  41*  --   --   ALT  --  41*  --   --   ALKPHOS  --  133*  --   --   BILITOT  --  0.6  --   --     ------------------------------------------------------------------------------------------------------------------ estimated creatinine clearance is 45.9 ml/min (by C-G formula based on Cr of 1.33). ------------------------------------------------------------------------------------------------------------------  Recent Labs  05/23/13 0640  HGBA1C 8.0*   ------------------------------------------------------------------------------------------------------------------ No results found for this basename: CHOL, HDL, LDLCALC, TRIG, CHOLHDL, LDLDIRECT,  in the last 72 hours ------------------------------------------------------------------------------------------------------------------ No results found for this basename: TSH, T4TOTAL, FREET3, T3FREE, THYROIDAB,  in the last 72 hours ------------------------------------------------------------------------------------------------------------------ No results found for this basename: VITAMINB12, FOLATE, FERRITIN, TIBC, IRON, RETICCTPCT,  in the last 72 hours  Coagulation profile  Recent Labs Lab 05/23/13 1615  INR 1.05    No results found for this basename: DDIMER,  in the last 72 hours  Cardiac Enzymes  Recent Labs Lab 05/22/13 2209  TROPONINI <0.30   ------------------------------------------------------------------------------------------------------------------ No components found with this basename: POCBNP,

## 2013-05-24 NOTE — Interval H&P Note (Signed)
History and Physical Interval Note: Consult note reviewed, chart over the last 9 months reviewed.  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  I do not see a previous liver bx Will repeat EGD today The nature of the procedure, as well as the risks, benefits, and alternatives were carefully and thoroughly reviewed with the patient. Ample time for discussion and questions allowed. The patient understood, was satisfied, and agreed to proceed.    05/24/2013 9:19 AM  Charlott Rakes  has presented today for surgery, with the diagnosis of anemia  The various methods of treatment have been discussed with the patient and family. After consideration of risks, benefits and other options for treatment, the patient has consented to  Procedure(s): ESOPHAGOGASTRODUODENOSCOPY (EGD) (N/A) as a surgical intervention .  The patient's history has been reviewed, patient examined, no change in status, stable for surgery.  I have reviewed the patient's chart and labs.  Questions were answered to the patient's satisfaction.     Maribel Hadley M

## 2013-05-24 NOTE — Progress Notes (Signed)
Report received, patient is resting in bed; patient is stable and assessment has been completed; will continue to monitor patient. D.Cadel Stairs RN

## 2013-05-24 NOTE — Progress Notes (Signed)
Inpatient Diabetes Program Recommendations  AACE/ADA: New Consensus Statement on Inpatient Glycemic Control (2013)  Target Ranges:  Prepandial:   less than 140 mg/dL      Peak postprandial:   less than 180 mg/dL (1-2 hours)      Critically ill patients:  140 - 180 mg/dL    Inpatient Diabetes Program Recommendations Correction (SSI): Please add some correction insulin for elevations above 120 mg/dL Start with moderate tidwc.  Thank you, Lenor Coffin, RN, CNS, Diabetes Coordinator [phone])

## 2013-05-25 ENCOUNTER — Encounter (HOSPITAL_COMMUNITY): Payer: Self-pay | Admitting: Internal Medicine

## 2013-05-25 DIAGNOSIS — J449 Chronic obstructive pulmonary disease, unspecified: Secondary | ICD-10-CM

## 2013-05-25 DIAGNOSIS — R5381 Other malaise: Secondary | ICD-10-CM | POA: Diagnosis present

## 2013-05-25 LAB — BASIC METABOLIC PANEL
CO2: 29 mEq/L (ref 19–32)
Calcium: 8.8 mg/dL (ref 8.4–10.5)
Creatinine, Ser: 1.24 mg/dL — ABNORMAL HIGH (ref 0.50–1.10)
GFR calc Af Amer: 50 mL/min — ABNORMAL LOW (ref >90)
GFR calc non Af Amer: 43 mL/min — ABNORMAL LOW (ref >90)
Sodium: 138 mEq/L (ref 135–145)

## 2013-05-25 LAB — GLUCOSE, CAPILLARY
Glucose-Capillary: 335 mg/dL — ABNORMAL HIGH (ref 70–99)
Glucose-Capillary: 168 mg/dL — ABNORMAL HIGH (ref 70–99)
Glucose-Capillary: 181 mg/dL — ABNORMAL HIGH (ref 70–99)
Glucose-Capillary: 182 mg/dL — ABNORMAL HIGH (ref 70–99)
Glucose-Capillary: 210 mg/dL — ABNORMAL HIGH (ref 70–99)

## 2013-05-25 LAB — CBC
MCH: 31.7 pg (ref 26.0–34.0)
MCV: 96.5 fL (ref 78.0–100.0)
Platelets: 84 10*3/uL — ABNORMAL LOW (ref 150–400)
RBC: 2.59 MIL/uL — ABNORMAL LOW (ref 3.87–5.11)
RDW: 15.8 % — ABNORMAL HIGH (ref 11.5–15.5)

## 2013-05-25 LAB — IRON AND TIBC
Saturation Ratios: 16 % — ABNORMAL LOW (ref 20–55)
TIBC: 365 ug/dL (ref 250–470)

## 2013-05-25 MED ORDER — INSULIN ASPART 100 UNIT/ML ~~LOC~~ SOLN
0.0000 [IU] | Freq: Three times a day (TID) | SUBCUTANEOUS | Status: DC
  Administered 2013-05-25: 3 [IU] via SUBCUTANEOUS
  Administered 2013-05-26: 12:00:00 2 [IU] via SUBCUTANEOUS

## 2013-05-25 NOTE — Progress Notes (Addendum)
[facility]                                                                                Patient Demographics  [name], is a 72 y.o. female, DOB - May 01, 1941, ZOX:[id number]  Admit date - 05/22/2013   Admitting Physician Ron Parker, MD  Outpatient Primary MD for the patient is Michele Mcalpine, MD  LOS - 3  Interim history: This is a 72 year old female history of iron deficiency anemia secondary to blood loss, CHF, asthma, presented to the emergency department with increased shortness of breath which is thought to be multifactorial secondary to her COPD CHF and anemia. Patient was given a unit of blood. Currently her hemoglobin remained stable.  Had EGD on 05/24/2013. No bleeding was found at that time. Currently tolerating a diet.  Chief Complaint  Patient presents with  . Shortness of Breath        Assessment & Plan  1.  Iron deficiency Anemia,   EGD conducted yesterday.  GI recommended PPI BID, changing BB to nadolol, and added carafate.  Will continue to monitor H&H.  Hb has remained stable.    2. SOB (shortness of breath)  Shortness of breath is likely multifactorial. Patient does have a history of COPD and CHF, complicated by anemia.    Continue supplemental O2 and monitoring.  3.  HYPERTENSION  Patient is currently hypotensive normotensive. With holding parameters on her medications.  4.  DM  -Will continue sliding insulin scale with Lantus coverage. Although patient is n.p.o. at this time.  5.  COPD   Continue albuterol inhaler as well as oxygen supplementation as needed.    6. Autoimmune hepatitis, Chronic  7.  Other pancytopenia  -Likely due to her autoimmune hepatitis as well as anemia due to blood loss.    8. Congestive heart failure, diastolic   Echo in June 2014, shows grade 2 diastolic dysfunction.  Will continue to monitor closely.  Patient appears euvolemic at this time.  Will continue lasix.   9.  AORTIC STENOSIS, chronic  10. Acute  Renal failure  Cr is trending downward to 1.24 today.  Will continue to monitor.  May be secondary to volume status.   11. Deconditioning  -Will consult PT.  Code Status: Full  Family Communication: None  Disposition Plan: Admitted   Procedures EGD with ablation, biopsy   Consults  Gastroenterology   DVT Prophylaxis  SCDs  Lab Results  Component Value Date   PLT 84* 05/25/2013    Medications  Scheduled Meds: . buPROPion  150 mg Oral Daily  . furosemide  40 mg Oral BID  . insulin glargine  100 Units Subcutaneous BID  . lisinopril  5 mg Oral Daily  . mometasone-formoterol  2 puff Inhalation BID  . nadolol  20 mg Oral Daily  . pantoprazole  40 mg Oral BID AC  . potassium chloride SA  10 mEq Oral QODAY  . QUEtiapine  400 mg Oral QHS  . rOPINIRole  4 mg Oral QHS  . simvastatin  40 mg Oral QHS  . sodium chloride  3 mL Intravenous Q12H  . sucralfate  1 g Oral TID WC & HS  .  venlafaxine XR  150 mg Oral Q breakfast  . [START ON 05/28/2013] Vitamin D (Ergocalciferol)  50,000 Units Oral Q7 days   Continuous Infusions: . sodium chloride 500 mL (05/24/13 0856)   PRN Meds:.acetaminophen, acetaminophen, albuterol, ALPRAZolam, alum & mag hydroxide-simeth, HYDROmorphone (DILAUDID) injection, ondansetron (ZOFRAN) IV, ondansetron, oxyCODONE, polyethylene glycol, simethicone, zolpidem  Antibiotics    Anti-infectives   None       Time Spent in minutes   20 minutes   Berdina Cheever D.O. on 05/25/2013 at 10:21 AM  Between 7am to 7pm - Pager - [phone]  After 7pm go to [email or web address] - password [email or web address]  And look for the night coverage person covering for me after hours  [facility] Office  [phone]    Subjective:   [name] was seen examined. She states her shortness of breath has not improved.  Complains of abdominal pain since her EGD yesterday. There although very nonspecific complaint. Patient states she's been able to tolerate her diet well  with no abdominal pain or nausea vomiting. At this time she denies any dizziness, chest pain, palpitations, constipation, or diarrhea.  Objective:   Filed Vitals:   05/24/13 2051 05/25/13 0539 05/25/13 0808 05/25/13 0923  BP: 128/41 119/48 117/59   Pulse: 55 56 54   Temp: 98.3 F (36.8 C) 96.9 F (36.1 C) 98.4 F (36.9 C)   TempSrc: Oral Oral Oral   Resp: 20 18 20    Height:      Weight:  99.61 kg (219 lb 9.6 oz)    SpO2: 98% 97% 100% 100%    Wt Readings from Last 3 Encounters:  05/25/13 99.61 kg (219 lb 9.6 oz)  05/25/13 99.61 kg (219 lb 9.6 oz)  05/25/13 99.61 kg (219 lb 9.6 oz)     Intake/Output Summary (Last 24 hours) at 05/25/13 1021 Last data filed at 05/25/13 1005  Gross per 24 hour  Intake   1583 ml  Output   2950 ml  Net  -1367 ml    Exam  General: Well developed, well nourished, NAD, appears stated age  HEENT: NCAT, PERRLA, EOMI, Anicteic Sclera, mucous membranes moist. No pharyngeal erythema or exudates  Neck: Supple, no JVD, no masses,  Cardiovascular: S1 S2 auscultated, 2/6 SEM. Regular rate and rhythm.  Respiratory: Clear to auscultation bilaterally with equal chest rise  Abdomen: Soft, obese, non-tender to palpation, nondistended, + bowel sounds  Extremities: warm dry without cyanosis clubbing or edema  Neuro: AAOx3, cranial nerves grossly intact.   Skin: Without rashes exudates or nodules  Psych: Normal affect and demeanor with intact judgement and insight    Data Review   Micro Results No results found for this or any previous visit (from the past 240 hour(s)).  Radiology Reports Dg Chest 2 View  05/22/2013   *RADIOLOGY REPORT*  Clinical Data: Shortness of breath.  CHEST - 2 VIEW  Comparison: 05/10/2013 and prior chest radiographs  Findings: The cardiomediastinal silhouette is unremarkable. Mild peribronchial thickening is stable. Lingular scarring is again noted. There is no evidence of focal airspace disease, pulmonary edema,  suspicious pulmonary nodule/mass, pleural effusion, or pneumothorax. No acute bony abnormalities are identified.  IMPRESSION: No evidence of acute cardiopulmonary disease.   Original Report Authenticated By: [name] Pier, M.D.   Dg Chest 2 View  05/10/2013   CLINICAL DATA:  Shortness of Breath. Cough.  EXAM: CHEST  2 VIEW  COMPARISON:  04/24/2013  FINDINGS: Cardiomegaly noted with mild airway thickening. Stable scarring in the left mid lung.  No significant pleural effusion.  IMPRESSION: 1. Stable cardiomegaly with stable lingular scarring. 2. Airway thickening is present, suggesting bronchitis or reactive airways disease.   Electronically Signed   By: Herbie Baltimore   On: 05/10/2013 12:07   Dg Chest Port 1 View  04/24/2013   *RADIOLOGY REPORT*  Clinical Data: Short of breath.  PORTABLE CHEST - 1 VIEW  Comparison: 03/22/2013.  Findings: Cardiomegaly is present with interstitial pulmonary edema.  Vascular congestion. Monitoring leads are projected over the chest.  Mediastinal contours appears similar to prior. Prominence of the right hilum is chronic.  IMPRESSION: Mild CHF with interstitial pulmonary edema.   Original Report Authenticated By: Andreas Newport, M.D.    CBC  Recent Labs Lab 05/22/13 2209 05/23/13 0104 05/23/13 1610 05/23/13 1615 05/24/13 0345 05/25/13 0514  WBC 3.1*  --  3.4*  --  3.0* 3.9*  HGB 7.7* 7.0* 7.8* 8.4* 7.8* 8.2*  HCT 23.4* 21.1* 23.8* 25.6* 24.1* 25.0*  PLT 80*  --  77*  --  72* 84*  MCV 98.3  --  96.7  --  96.8 96.5  MCH 32.4  --  31.7  --  31.3 31.7  MCHC 32.9  --  32.8  --  32.4 32.8  RDW 15.3  --  17.0*  --  16.5* 15.8*  LYMPHSABS 0.4*  --   --   --   --   --   MONOABS 0.3  --   --   --   --   --   EOSABS 0.1  --   --   --   --   --   BASOSABS 0.0  --   --   --   --   --     Chemistries   Recent Labs Lab 05/22/13 2209 05/23/13 0640 05/24/13 0345 05/25/13 0514  NA 133* 137 139 138  K 4.4 3.8 4.0 3.9  CL 99 102 102 101  CO2 24 27 26 29   GLUCOSE  376* 211* 151* 182*  BUN 42* 44* 42* 37*  CREATININE 1.16* 1.24* 1.33* 1.24*  CALCIUM 8.3* 8.5 8.8 8.8  AST 41*  --   --   --   ALT 41*  --   --   --   ALKPHOS 133*  --   --   --   BILITOT 0.6  --   --   --    ------------------------------------------------------------------------------------------------------------------ estimated creatinine clearance is 49.1 ml/min (by C-G formula based on Cr of 1.24). ------------------------------------------------------------------------------------------------------------------  Recent Labs  05/23/13 0640  HGBA1C 8.0*   ------------------------------------------------------------------------------------------------------------------ No results found for this basename: CHOL, HDL, LDLCALC, TRIG, CHOLHDL, LDLDIRECT,  in the last 72 hours ------------------------------------------------------------------------------------------------------------------ No results found for this basename: TSH, T4TOTAL, FREET3, T3FREE, THYROIDAB,  in the last 72 hours ------------------------------------------------------------------------------------------------------------------ No results found for this basename: VITAMINB12, FOLATE, FERRITIN, TIBC, IRON, RETICCTPCT,  in the last 72 hours  Coagulation profile  Recent Labs Lab 05/23/13 1615  INR 1.05    No results found for this basename: DDIMER,  in the last 72 hours  Cardiac Enzymes  Recent Labs Lab 05/22/13 2209  TROPONINI <0.30   ------------------------------------------------------------------------------------------------------------------ No components found with this basename: POCBNP,

## 2013-05-25 NOTE — Progress Notes (Signed)
Patient seen, examined, and I agree with the above documentation, including the assessment and plan. Chronic iron def. Anemia with GAVE.  Also gastric nodules in the antrum/pre-pylorus which were bx'ed and results pending. BID PPI, sucralfate BID Agree with IV iron if iron studies remain low, given previous intolerance to oral iron replacement Followup with Dr. Juanda Chance after d/c Call with questions

## 2013-05-25 NOTE — Progress Notes (Signed)
     [facility] Daily Rounding Note 05/25/2013, 9:42 AM  SUBJECTIVE:       Has some new discomfort in upper abdomen.  No nausea currently, though this is a >12 year problem for her. Stools dark as always  OBJECTIVE:         Vital signs in last 24 hours:    Temp:  [96.9 F (36.1 C)-98.4 F (36.9 C)] 98.4 F (36.9 C) (09/30 0808) Pulse Rate:  [51-97] 54 (09/30 0808) Resp:  [15-20] 20 (09/30 0808) BP: (92-147)/(33-122) 117/59 mmHg (09/30 0808) SpO2:  [95 %-100 %] 100 % (09/30 0923) Weight:  [99.61 kg (219 lb 9.6 oz)] 99.61 kg (219 lb 9.6 oz) (09/30 0539) Last BM Date: 05/23/13 General: obese, looks unwell currently   Heart: RRR Chest: clear but greatly diminished BS Abdomen: soft, minor discomfort in  Upper  Abdomen.  acitve BS  Extremities: no CCE Neuro/Psych:  Pleasant, not confused.  Flat affect.  Not anxious.  Intake/Output from previous day: 09/29 0701 - 09/30 0700 In: 1360 [P.O.:1060; I.V.:300] Out: 2100 [Urine:2100]  Intake/Output this shift: Total I/O In: 220 [P.O.:220] Out: 350 [Urine:350]  Lab Results:  Recent Labs  05/23/13 0640 05/23/13 1615 05/24/13 0345 05/25/13 0514  WBC 3.4*  --  3.0* 3.9*  HGB 7.8* 8.4* 7.8* 8.2*  HCT 23.8* 25.6* 24.1* 25.0*  PLT 77*  --  72* 84*   BMET  Recent Labs  05/23/13 0640 05/24/13 0345 05/25/13 0514  NA 137 139 138  K 3.8 4.0 3.9  CL 102 102 101  CO2 27 26 29   GLUCOSE 211* 151* 182*  BUN 44* 42* 37*  CREATININE 1.24* 1.33* 1.24*  CALCIUM 8.5 8.8 8.8   LFT  Recent Labs  05/22/13 2209  PROT 5.7*  ALBUMIN 2.7*  AST 41*  ALT 41*  ALKPHOS 133*  BILITOT 0.6   PT/INR  Recent Labs  05/23/13 1615  LABPROT 13.5  INR 1.05    ASSESMENT: *  Acute on Chronic GI blood loss with associated anemia.  Presented with recurrent melena EGD 9/20:  HH, GAVE treated with APC, nodular antral and prepyloric mucosa was biopsied.  *  Chirrotic liver by history and imaging.  No hx varices on her recent multiple EGDs.   *  Chronic nausea, no change *  Type 2 DM *  Chronic kidney disease.  *  Obesity.  BMI 38, 222 #, 101 kg *  OSA.  Doesn't use her CPAP *  Anxiety.  On multiple psychotropic meds.   PLAN: *  BID PPI, QID Carafate,  *  Follow up with Dr Juanda Chance in about one month.  *  Pt says po Iron never helped her, she  Was on q 2 to 3 week lab draws and parenteral iron infusion until about 2 months ago.   Has not had Iron studies since august.  [facility] if she ought to get parenteral iron before she discharges? I ordered ferritin and iron studies for today. *  Will sign off.    LOS: 3 days   Jennye Moccasin  05/25/2013, 9:42 AM Pager: [phone]

## 2013-05-25 NOTE — Evaluation (Signed)
Physical Therapy Evaluation Patient Details Name: [name] MRN: [id number] DOB: July 13, 1941 Today's Date: 05/25/2013 Time: 4098-1191 PT Time Calculation (min): 27 min  PT Assessment / Plan / Recommendation History of Present Illness  72 y.o. female with h/o severe COPD 3L home O2 at baseline, who presents to the ED with c/o worsening SOB especially with activity at home.  This is associated with dry non-productive cough.  She does note that on Sunday she also had worsening BLE edema.  Clinical Impression  Pt functioning near baseline. Pt safe to d/c home with spouse. Pt reports "I do not want home PT or OT." Acute PT to follow for energy conservation and stair negotiation.    PT Assessment  Patient needs continued PT services    Follow Up Recommendations  No PT follow up;Supervision/Assistance - 24 hour    Does the patient have the potential to tolerate intense rehabilitation      Barriers to Discharge        Equipment Recommendations  None recommended by PT    Recommendations for Other Services     Frequency Min 2X/week    Precautions / Restrictions Precautions Precautions: Fall Restrictions Weight Bearing Restrictions: No   Pertinent Vitals/Pain Denies pain      Mobility  Bed Mobility Bed Mobility: Supine to Sit Supine to Sit: 6: Modified independent (Device/Increase time);With rails;HOB elevated Details for Bed Mobility Assistance: increased time, onset of SOB, pt with hospital bed at home Transfers Transfers: Sit to Stand;Stand to Sit Sit to Stand: 5: Supervision;With upper extremity assist;From bed Stand to Sit: 5: Supervision;With upper extremity assist;To chair/3-in-1 Details for Transfer Assistance: v/c's for hand placement Ambulation/Gait Ambulation/Gait Assistance: 4: Min guard Ambulation Distance (Feet): 150 Feet Assistive device: Rolling walker Ambulation/Gait Assistance Details: + SOB, 1 standing rest break Gait Pattern: Within Functional  Limits Gait velocity: wfl Stairs: No    Exercises     PT Diagnosis: Generalized weakness;Difficulty walking  PT Problem List: Decreased strength;Decreased activity tolerance;Decreased balance;Decreased mobility PT Treatment Interventions: DME instruction;Gait training;Stair training;Functional mobility training;Therapeutic activities;Therapeutic exercise     PT Goals(Current goals can be found in the care plan section) Acute Rehab PT Goals Patient Stated Goal: home, "I don't want home PT or OT" PT Goal Formulation: With patient Time For Goal Achievement: 06/01/13 Potential to Achieve Goals: Good  Visit Information  Last PT Received On: 05/25/13 Assistance Needed: +1 History of Present Illness: 72 y.o. female with h/o severe COPD 3L home O2 at baseline, who presents to the ED with c/o worsening SOB especially with activity at home.  This is associated with dry non-productive cough.  She does note that on Sunday she also had worsening BLE edema.       Prior Functioning  Home Living Family/patient expects to be discharged to:: Private residence Living Arrangements: Spouse/significant other Available Help at Discharge: Family Type of Home: House Home Access: Stairs to enter Secretary/administrator of Steps: 3 Entrance Stairs-Rails: Right Home Layout: One level Home Equipment: Environmental consultant - 4 wheels Additional Comments: Home O2 Prior Function Level of Independence: Needs assistance Gait / Transfers Assistance Needed: uses RW outside home ADL's / Homemaking Assistance Needed: spouse assist with bathing/dressing due to onset of SOB Communication Communication: No difficulties Dominant Hand: Right    Cognition  Cognition Arousal/Alertness: Awake/alert Behavior During Therapy: WFL for tasks assessed/performed Overall Cognitive Status: Within Functional Limits for tasks assessed    Extremity/Trunk Assessment Upper Extremity Assessment Upper Extremity Assessment: Overall WFL for  tasks assessed Lower  Extremity Assessment Lower Extremity Assessment: Overall WFL for tasks assessed Cervical / Trunk Assessment Cervical / Trunk Assessment: Normal   Balance    End of Session PT - End of Session Equipment Utilized During Treatment: Gait belt Activity Tolerance: Patient tolerated treatment well Patient left: in chair;with nursing/sitter in room;with call bell/phone within reach Nurse Communication: Mobility status  GP     Marcene Brawn 05/25/2013, 4:43 PM  Lewis Shock, PT, DPT Pager #: [phone] Office #: [phone]

## 2013-05-25 NOTE — Progress Notes (Signed)
Utilization Review Completed.   Coren Sagan, RN, BSN Nurse Case Manager  [phone]  

## 2013-05-26 ENCOUNTER — Encounter: Payer: Self-pay | Admitting: Internal Medicine

## 2013-05-26 ENCOUNTER — Telehealth: Payer: Self-pay | Admitting: *Deleted

## 2013-05-26 DIAGNOSIS — K31819 Angiodysplasia of stomach and duodenum without bleeding: Secondary | ICD-10-CM

## 2013-05-26 DIAGNOSIS — K922 Gastrointestinal hemorrhage, unspecified: Secondary | ICD-10-CM

## 2013-05-26 DIAGNOSIS — E119 Type 2 diabetes mellitus without complications: Secondary | ICD-10-CM

## 2013-05-26 DIAGNOSIS — D649 Anemia, unspecified: Secondary | ICD-10-CM

## 2013-05-26 LAB — BASIC METABOLIC PANEL
Calcium: 8.8 mg/dL (ref 8.4–10.5)
Chloride: 100 mEq/L (ref 96–112)
GFR calc Af Amer: 36 mL/min — ABNORMAL LOW (ref >90)
GFR calc non Af Amer: 31 mL/min — ABNORMAL LOW (ref >90)
Glucose, Bld: 79 mg/dL (ref 70–99)
Potassium: 4 mEq/L (ref 3.5–5.1)
Sodium: 137 mEq/L (ref 135–145)

## 2013-05-26 LAB — GLUCOSE, CAPILLARY
Glucose-Capillary: 74 mg/dL (ref 70–99)
Glucose-Capillary: 144 mg/dL — ABNORMAL HIGH (ref 70–99)
Glucose-Capillary: 67 mg/dL — ABNORMAL LOW (ref 70–99)

## 2013-05-26 LAB — HEMOGLOBIN AND HEMATOCRIT, BLOOD
HCT: 24 % — ABNORMAL LOW (ref 36.0–46.0)
Hemoglobin: 7.9 g/dL — ABNORMAL LOW (ref 12.0–15.0)

## 2013-05-26 MED ORDER — SODIUM CHLORIDE 0.9 % IV SOLN
25.0000 mg | Freq: Once | INTRAVENOUS | Status: AC
  Administered 2013-05-26: 10:00:00 25 mg via INTRAVENOUS
  Filled 2013-05-26: qty 0.5

## 2013-05-26 MED ORDER — SUCRALFATE 1 GM/10ML PO SUSP: 1.0000 g | Freq: Three times a day (TID) | ORAL | Status: DC

## 2013-05-26 MED ORDER — IRON DEXTRAN 50 MG/ML IJ SOLN: 500.0000 mg | Freq: Once | INTRAMUSCULAR | Status: DC

## 2013-05-26 MED ORDER — SODIUM CHLORIDE 0.9 % IV SOLN
500.0000 mg | Freq: Once | INTRAVENOUS | Status: AC
  Administered 2013-05-26: 500 mg via INTRAVENOUS
  Filled 2013-05-26 (×2): qty 10

## 2013-05-26 MED ORDER — NADOLOL 20 MG PO TABS: 20.0000 mg | ORAL_TABLET | Freq: Every day | ORAL | Status: DC

## 2013-05-26 NOTE — Progress Notes (Signed)
Patient's CBG is 67, patient states, "I am sweaty".  Patient has been given dinner tray and orange juice; will recheck in 15 minutes and continue to monitor patient. Lorretta Harp RN

## 2013-05-26 NOTE — Progress Notes (Signed)
Patient evaluated for community based chronic disease management services with [facility] Care Management Program as a benefit of patient's [facility] Medicare Insurance.  Spoke with patient at bedside to explain [facility] Care Management services.  Patient will receive a post discharge transition of care call and will be evaluated for monthly home visits for assessments and COPD disease process education.  Patient has been admitted 4 times in the last 6 months.  She has had significant challenges with anemia.  She desires additional education on early symptom recognition for her anemia and COPD.  Written consents obtained and contact numbers have been identified.  Left contact information and [facility] literature at bedside. Made inpatient Case Manager aware that [facility] Care Management following. Of note, [facility] Care Management services does not replace or interfere with any services that are arranged by inpatient case management or social work.  For additional questions or referrals please contact Anibal Henderson BSN RN [facility] Liaison at [phone].

## 2013-05-26 NOTE — Progress Notes (Signed)
PT Cancellation Note  Patient Details Name: [name] MRN: [id number] DOB: 11-Feb-1941   Cancelled Treatment:    Reason Eval/Treat Not Completed: Other (comment) (Pt going home today. She doesn't feel she needs to practice steps and feels she is at baseline.)   Casen Pryor 05/26/2013, 11:09 AM

## 2013-05-26 NOTE — Progress Notes (Deleted)
Physician Discharge Summary  [name]:[id number] DOB: [birth year] DOA: 05/22/2013  PCP: Michele Mcalpine, MD  Admit date: 05/22/2013 Discharge date: 05/26/2013  Time spent: 35 minutes  Recommendations for Outpatient Follow-up:  1. Follow up with GI as an outpatient in 1 week for biopsy results.. 2.   BNP 155  Estimated Dry weight 99.6 Kg   Discharge Diagnoses:  Principal Problem:   Anemia, iron deficiency Active Problems:   DM   HYPERTENSION   AORTIC STENOSIS   COPD   Autoimmune hepatitis   GAVE (gastric antral vascular ectasia)   Other pancytopenia   Congestive heart failure, unspecified   SOB (shortness of breath)   Physical deconditioning   Discharge Condition: stable  Diet recommendation: heart healthy  Filed Weights   05/24/13 0428 05/25/13 0539 05/26/13 0509  Weight: 100.1 kg (220 lb 10.9 oz) 99.61 kg (219 lb 9.6 oz) 100.245 kg (221 lb)    History of present illness:  72 y.o. female with Multiple medical problems including Autoimmune Hepatitis and Portal Hypertensive Gastropathy who presents to the ED with complaints of worsening SOB and dyspnea with exertion over the past 2 days. She reports having chronic dyspnea and reports having melena For years. She reports that she has seen her GI doctor Dr Juanda Chance last month, and she also reports that she at times has had to have multiple transfusions. She denies having any ADB pain but reports having nausea. In the ED , a rectal examination was performed by the EDP and was found to be HEME positive, and her hemoglobin level was found to be 7.7. aunit of PRBCs was ordered for transfusion since she was symptomatic in the ED. She was referred for admission   Hospital Course:  Iron deficiency Anemia and GI bleed due to GAVE:  - EGD 9.30.2014: GAVE, treated with APC - GI recommended PPI BID, changing BB to nadolol, and added carafate.  - Hb has remained stable after EGD. Ferritin checked was 18. 10.01.2014 IV iron as  patient has not tolerated oral iron in the past.  SOB (shortness of breath)  - Shortness of breath is likely multifactorial.  - Patient does have a history of COPD and CHF, complicated by anemia. Now resolved.  HYPERTENSION  - Patient is currently hypotensive normotensive.    DM  -Will continue sliding insulin scale with Lantus coverage. Although patient is n.p.o. at this time.   COPD  - Continue albuterol inhaler as well as oxygen supplementation as needed.   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   Congestive heart failure, diastolic  - Echo in June 2014, shows grade 2 diastolic dysfunction.  - cont lasix dose.  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   Procedures:  EGD 9.29.2014: - The mucosa of the esophagus appeared normal. No esophageal  varices  - Hiatus hernia was found with gastropathy  - GAVE, treated with APC  - Nodular antral and prepyloric mucosa, multiple biopsies     Consultations:  GI  Discharge Exam: Filed Vitals:   05/26/13 1230  BP: 110/43  Pulse: 47  Temp: 98.4 F (36.9 C)  Resp: 18    General: A&O x3 Cardiovascular: RRR Respiratory: good air movement CTA B/L  Discharge Instructions      Discharge Orders   Future Appointments Provider Department Dept Phone   05/31/2013 11:30 AM [facility] [place] Office [phone]  05/31/2013 3:00 PM Michele Mcalpine, MD [facility] [phone]   06/07/2013 10:10 AM Beatrice Lecher, PA-C [facility] [place] Office [phone]   06/10/2013 10:15 AM Romero Belling, MD [facility] ENDOCRINOLOGY [phone]   06/29/2013 3:15 PM Hart Carwin, MD [facility] [phone]   Future Orders Complete By Expires   Diet - low sodium heart healthy  As directed    Increase activity slowly  As directed        Medication List    STOP taking  these medications       atenolol 50 MG tablet  Commonly known as:  TENORMIN     dextromethorphan-guaiFENesin 30-600 MG per 12 hr tablet  Commonly known as:  MUCINEX DM      TAKE these medications       ALPRAZolam 1 MG tablet  Commonly known as:  XANAX  Take 1 mg by mouth 2 (two) times daily as needed for anxiety.     AMBULATORY NON FORMULARY MEDICATION  Continuous O2 @@ 3LMP     b complex vitamins tablet  Take 1 tablet by mouth daily.     B-D ULTRAFINE III SHORT PEN 31G X 8 MM Misc  Generic drug:  Insulin Pen Needle  USE AS DIRECTED     buPROPion 150 MG 24 hr tablet  Commonly known as:  WELLBUTRIN XL  Take 150 mg by mouth daily.     CENTRUM SILVER PO  Take by mouth daily.     desvenlafaxine 100 MG 24 hr tablet  Commonly known as:  PRISTIQ  Take 150 mg by mouth daily. TAKE 150MG  DAILY     Fluticasone-Salmeterol 250-50 MCG/DOSE Aepb  Commonly known as:  ADVAIR DISKUS  Inhale 1 puff into the lungs 2 (two) times daily.     furosemide 20 MG tablet  Commonly known as:  LASIX  Take 2 tablets (40 mg total) by mouth 2 (two) times daily.     GAS-X PO  Take 1 tablet by mouth daily. Takes after the biggest meal of the day     glucose blood test strip  Commonly known as:  FREESTYLE LITE  1 each by Other route 3 (three) times daily. And lancets 3/day 250.01     Insulin Glargine 100 UNIT/ML Sopn  Inject 200 Units into the skin daily. And pen needles 1/day     lisinopril 5 MG tablet  Commonly known as:  PRINIVIL,ZESTRIL  Take 5 mg by mouth daily.     nadolol 20 MG tablet  Commonly known as:  CORGARD  Take 1 tablet (20 mg total) by mouth daily.     OCEAN NASAL SPRAY NA  Place 1 spray into the nose daily as needed (congestion).     pantoprazole 40 MG tablet  Commonly known as:  PROTONIX  Take 40 mg by mouth daily.     polyethylene glycol packet  Commonly known as:  MIRALAX / GLYCOLAX  Take 17 g by mouth daily as needed (constipation).     potassium chloride 10  MEQ tablet  Commonly known as:  K-DUR,KLOR-CON  Take 1 tablet (10 mEq total) by mouth every other day.     PROAIR HFA 108 (90 BASE) MCG/ACT inhaler  Generic drug:  albuterol  Inhale 2 puffs into the lungs every 6 (six) hours as needed for shortness of breath.     promethazine 25 MG tablet  Commonly known as:  PHENERGAN  Take 25 mg by mouth 2 (two)  times daily as needed for nausea.     QUEtiapine 100 MG tablet  Commonly known as:  SEROQUEL  Take 400 mg by mouth at bedtime.     rOPINIRole 4 MG tablet  Commonly known as:  REQUIP  Take 4 mg by mouth at bedtime.     simvastatin 40 MG tablet  Commonly known as:  ZOCOR  Take 40 mg by mouth at bedtime.     sucralfate 1 GM/10ML suspension  Commonly known as:  CARAFATE  Take 10 mL (1 g total) by mouth 2 (two) times daily.     Vitamin D (Ergocalciferol) 50000 UNITS Caps capsule  Commonly known as:  DRISDOL  Take 50,000 Units by mouth every 7 (seven) days. On fridays       Allergies  Allergen Reactions  . Aspirin Other (See Comments)    Causes nosebleeds  . Penicillins Itching  . Theophylline Nausea And Vomiting   Follow-up Information   Follow up with [name], MD On 06/29/2013. (3:15 PM .  office will call with date and time for lab work in October. )    Specialty:  Gastroenterology   Contact information:   [address] [place] [address] [phone]        The results of significant diagnostics from this hospitalization (including imaging, microbiology, ancillary and laboratory) are listed below for reference.    Significant Diagnostic Studies: Dg Chest 2 View  05/22/2013   *RADIOLOGY REPORT*  Clinical Data: Shortness of breath.  CHEST - 2 VIEW  Comparison: 05/10/2013 and prior chest radiographs  Findings: The cardiomediastinal silhouette is unremarkable. Mild peribronchial thickening is stable. Lingular scarring is again noted. There is no evidence of focal airspace disease, pulmonary edema, suspicious  pulmonary nodule/mass, pleural effusion, or pneumothorax. No acute bony abnormalities are identified.  IMPRESSION: No evidence of acute cardiopulmonary disease.   Original Report Authenticated By: [name] Pier, M.D.   Dg Chest 2 View  05/10/2013   CLINICAL DATA:  Shortness of Breath. Cough.  EXAM: CHEST  2 VIEW  COMPARISON:  04/24/2013  FINDINGS: Cardiomegaly noted with mild airway thickening. Stable scarring in the left mid lung. No significant pleural effusion.  IMPRESSION: 1. Stable cardiomegaly with stable lingular scarring. 2. Airway thickening is present, suggesting bronchitis or reactive airways disease.   Electronically Signed   By: Herbie Baltimore   On: 05/10/2013 12:07    Microbiology: No results found for this or any previous visit (from the past 240 hour(s)).   Labs: Basic Metabolic Panel:  Recent Labs Lab 05/22/13 2209 05/23/13 0640 05/24/13 0345 05/25/13 0514 05/26/13 0535  NA 133* 137 139 138 137  K 4.4 3.8 4.0 3.9 4.0  CL 99 102 102 101 100  CO2 24 27 26 29 30   GLUCOSE 376* 211* 151* 182* 79  BUN 42* 44* 42* 37* 43*  CREATININE 1.16* 1.24* 1.33* 1.24* 1.64*  CALCIUM 8.3* 8.5 8.8 8.8 8.8   Liver Function Tests:  Recent Labs Lab 05/22/13 2209  AST 41*  ALT 41*  ALKPHOS 133*  BILITOT 0.6  PROT 5.7*  ALBUMIN 2.7*   No results found for this basename: LIPASE, AMYLASE,  in the last 168 hours No results found for this basename: AMMONIA,  in the last 168 hours CBC:  Recent Labs Lab 05/22/13 2209  05/23/13 0640 05/23/13 1615 05/24/13 0345 05/25/13 0514 05/26/13 0535  WBC 3.1*  --  3.4*  --  3.0* 3.9*  --   NEUTROABS 2.3  --   --   --   --   --   --  HGB 7.7*  < > 7.8* 8.4* 7.8* 8.2* 7.9*  HCT 23.4*  < > 23.8* 25.6* 24.1* 25.0* 24.0*  MCV 98.3  --  96.7  --  96.8 96.5  --   PLT 80*  --  77*  --  72* 84*  --   < > = values in this interval not displayed. Cardiac Enzymes:  Recent Labs Lab 05/22/13 2209  TROPONINI <0.30   BNP: BNP (last 3  results)  Recent Labs  04/24/13 1805 05/10/13 1034 05/22/13 2209  PROBNP 147.3* 72.0 155.3*   CBG:  Recent Labs Lab 05/25/13 1638 05/25/13 2100 05/26/13 0640 05/26/13 1108 05/26/13 1613  GLUCAP 182* 210* 74 144* 97       Signed:  FELIZ ORTIZ, Natania Finigan  [facility] 05/26/2013, 5:13 PM

## 2013-05-26 NOTE — Progress Notes (Signed)
Patient's IV and telemetry has been discontinued and CMT notified, patient will be discharged home with husband; patient verbalizes understanding of discharge instructions.  Lorretta Harp RN

## 2013-05-26 NOTE — Progress Notes (Signed)
Patient's CBG is now 117; will continue to monitor patient until discharged home in an hour.  Lorretta Harp RN

## 2013-05-26 NOTE — Telephone Encounter (Signed)
Message copied by Daphine Deutscher on Wed May 26, 2013  9:29 AM ------      Message from: Hart Carwin      Created: Tue May 25, 2013  1:19 PM       Please check Iron levels at that time too. Thanx db      ----- Message -----         From: Dianah Field, PA-C         Sent: 05/25/2013  11:31 AM           To: Hart Carwin, MD, Daphine Deutscher, RN            Hi Fender Herder, Pt has office follow up for Nov 4th.  Pt will see DB then .            However Pt needs labs:  CBC or HGb/hct in mid to late October.  Can you arrange this and notify pt of day for her to go to lab?      She may need to  Resume her periodic iron infusions.  She was just admitted with recurrent  Anemia from on going GI losses.  See Dr Lauro Franklin  EGD of 9/29            Thanks Sarah.        ------

## 2013-05-26 NOTE — Telephone Encounter (Signed)
Scheduled for labs on 06/14/13.Labs in EPIC. Left a message for patient to call me.

## 2013-05-26 NOTE — Progress Notes (Signed)
MD made aware of CBG drop, patient is stable and will discharge home.  Lorretta Harp RN

## 2013-05-26 NOTE — Clinical Documentation Improvement (Signed)
THIS DOCUMENT IS NOT A PERMANENT PART OF THE MEDICAL RECORD  Please update your documentation with the medical record to reflect your response to this query. If you need help knowing how to do this please call [phone].  05/26/13   Dr. David Stall,  In a better effort to capture your patient's severity of illness, reflect appropriate length of stay and utilization of resources, a review of the patient medical record has revealed the following information:   - Patient admitted with Anemia requiring PRBC transfusion and heme + stools.   - Known history of GAVE requiring previous treatment with APC   - EGD results 05/24/13 ENDOSCOPIC IMPRESSION:  1. The mucosa of the esophagus appeared normal. No esophageal varices  2. Hiatus hernia was found with gastropathy  3. GAVE, treated with APC  4. Nodular antral and prepyloric mucosa, multiple biopsies (see above)  RECOMMENDATIONS:  1. Continue twice-daily PPI  2. Add Carafate 1 g 4 times daily  3. Change atenolol to nonselective beta blocker for better reduction portal pressures (nadolol or propranolol), titrated to a resting heart rate of 60 beats per minute as tolerated by blood pressure  4. Continue to monitor hemoglobin and iron studies. Transfuse if necessary  5. Office followup with Dr. Juanda Chance   Based on your clinical judgment, please clarify and document, in the progress notes and discharge summary, the Possible, Probable, Suspected or Known cause  the patient's Anemia and GI Bleed:   - Anemia 2/2 GI Bleed 2/2 GAVE   - Anemia 2/2 .....................   - Other Condition   - Unable to clinically Determine   In responding to this query please exercise your independent judgment.     The fact that a query is asked, does not imply that any particular answer is desired or expected.   Documentation of Possible, Probable, or Suspected condition/s is premitted with inpatient documentation.  However, Possible, Probable, or  Suspected diagnoses must be documented as such at the time of discharge, unless confirmed or ruled out.  Reviewed:  no additional documentation provided  Thank Hildred Laser   [phone] Health Information Management [place]

## 2013-05-27 LAB — TYPE AND SCREEN
ABO/RH(D): O POS
Antibody Screen: POSITIVE
DAT, IgG: NEGATIVE
Donor AG Type: NEGATIVE
Unit division: 0

## 2013-05-27 NOTE — Telephone Encounter (Signed)
Spoke  With patient and told her I have scheduled her labs and will remind her a few days before they are due.

## 2013-05-27 NOTE — Discharge Summary (Signed)
Physician Discharge Summary  [name] ZDG:[id number] DOB: 05-Feb-1941 DOA: 05/22/2013  PCP: Michele Mcalpine, MD  Admit date: 05/22/2013 Discharge date: 05/27/2013  Time spent: 35 minutes  Recommendations for Outpatient Follow-up:  1. Follow up with GI as an outpatient in 1 week for biopsy results.. 2.   BNP 155  Estimated Dry weight 99.6 Kg   Discharge Diagnoses:  Principal Problem:   Anemia, iron deficiency Active Problems:   DM   HYPERTENSION   AORTIC STENOSIS   COPD   Autoimmune hepatitis   GAVE (gastric antral vascular ectasia)   Other pancytopenia   Congestive heart failure, unspecified   SOB (shortness of breath)   Physical deconditioning   Discharge Condition: stable  Diet recommendation: heart healthy  Filed Weights   05/24/13 0428 05/25/13 0539 05/26/13 0509  Weight: 100.1 kg (220 lb 10.9 oz) 99.61 kg (219 lb 9.6 oz) 100.245 kg (221 lb)    History of present illness:  72 y.o. female with Multiple medical problems including Autoimmune Hepatitis and Portal Hypertensive Gastropathy who presents to the ED with complaints of worsening SOB and dyspnea with exertion over the past 2 days. She reports having chronic dyspnea and reports having melena For years. She reports that she has seen her GI doctor Dr Juanda Chance last month, and she also reports that she at times has had to have multiple transfusions. She denies having any ADB pain but reports having nausea. In the ED , a rectal examination was performed by the EDP and was found to be HEME positive, and her hemoglobin level was found to be 7.7. aunit of PRBCs was ordered for transfusion since she was symptomatic in the ED. She was referred for admission   Hospital Course:  Iron deficiency Anemia and GI bleed due to GAVE:  - EGD 9.30.2014: GAVE, treated with APC - GI recommended PPI BID, changing BB to nadolol, and added carafate.  - Hb has remained stable after EGD. Ferritin checked was 18. 10.01.2014 IV iron as  patient has not tolerated oral iron in the past.  SOB (shortness of breath)  - Shortness of breath is likely multifactorial.  - Patient does have a history of COPD and CHF, complicated by anemia. Now resolved.  HYPERTENSION  - Patient is currently hypotensive normotensive.    DM  -Will continue sliding insulin scale with Lantus coverage. Although patient is n.p.o. at this time.   COPD  - Continue albuterol inhaler as well as oxygen supplementation as needed.   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   Congestive heart failure, diastolic  - Echo in June 2014, shows grade 2 diastolic dysfunction.  - cont lasix dose.  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   Procedures:  EGD 9.29.2014: - The mucosa of the esophagus appeared normal. No esophageal  varices  - Hiatus hernia was found with gastropathy  - GAVE, treated with APC  - Nodular antral and prepyloric mucosa, multiple biopsies     Consultations:  GI  Discharge Exam: Filed Vitals:   05/26/13 1738  BP: 123/44  Pulse: 50  Temp:   Resp: 16    General: A&O x3 Cardiovascular: RRR Respiratory: good air movement CTA B/L  Discharge Instructions      Discharge Orders   Future Appointments Provider Department Dept Phone   05/31/2013 11:30 AM [facility] Office [phone]   05/31/2013  3:00 PM Michele Mcalpine, MD [facility] [phone]   06/07/2013 10:10 AM Beatrice Lecher, PA-C [facility] [place] Office [phone]   06/10/2013 10:15 AM Romero Belling, MD [facility] [phone]   06/29/2013 3:15 PM Hart Carwin, MD [facility] [phone]   Future Orders Complete By Expires   Diet - low sodium heart healthy  As directed    Increase activity slowly  As directed        Medication List    STOP taking these medications        atenolol 50 MG tablet  Commonly known as:  TENORMIN     dextromethorphan-guaiFENesin 30-600 MG per 12 hr tablet  Commonly known as:  MUCINEX DM      TAKE these medications       ALPRAZolam 1 MG tablet  Commonly known as:  XANAX  Take 1 mg by mouth 2 (two) times daily as needed for anxiety.     AMBULATORY NON FORMULARY MEDICATION  Continuous O2 @@ 3LMP     b complex vitamins tablet  Take 1 tablet by mouth daily.     B-D ULTRAFINE III SHORT PEN 31G X 8 MM Misc  Generic drug:  Insulin Pen Needle  USE AS DIRECTED     buPROPion 150 MG 24 hr tablet  Commonly known as:  WELLBUTRIN XL  Take 150 mg by mouth daily.     CENTRUM SILVER PO  Take by mouth daily.     desvenlafaxine 100 MG 24 hr tablet  Commonly known as:  PRISTIQ  Take 150 mg by mouth daily. TAKE 150MG  DAILY     Fluticasone-Salmeterol 250-50 MCG/DOSE Aepb  Commonly known as:  ADVAIR DISKUS  Inhale 1 puff into the lungs 2 (two) times daily.     furosemide 20 MG tablet  Commonly known as:  LASIX  Take 2 tablets (40 mg total) by mouth 2 (two) times daily.     GAS-X PO  Take 1 tablet by mouth daily. Takes after the biggest meal of the day     glucose blood test strip  Commonly known as:  FREESTYLE LITE  1 each by Other route 3 (three) times daily. And lancets 3/day 250.01     Insulin Glargine 100 UNIT/ML Sopn  Inject 200 Units into the skin daily. And pen needles 1/day     lisinopril 5 MG tablet  Commonly known as:  PRINIVIL,ZESTRIL  Take 5 mg by mouth daily.     nadolol 20 MG tablet  Commonly known as:  CORGARD  Take 1 tablet (20 mg total) by mouth daily.     OCEAN NASAL SPRAY NA  Place 1 spray into the nose daily as needed (congestion).     pantoprazole 40 MG tablet  Commonly known as:  PROTONIX  Take 40 mg by mouth daily.     polyethylene glycol packet  Commonly known as:  MIRALAX / GLYCOLAX  Take 17 g by mouth daily as needed (constipation).     potassium chloride 10 MEQ tablet  Commonly  known as:  K-DUR,KLOR-CON  Take 1 tablet (10 mEq total) by mouth every other day.     PROAIR HFA 108 (90 BASE) MCG/ACT inhaler  Generic drug:  albuterol  Inhale 2 puffs into the lungs every 6 (six) hours as needed for shortness of breath.     promethazine 25 MG tablet  Commonly known as:  PHENERGAN  Take 25 mg by mouth 2 (two) times  daily as needed for nausea.     QUEtiapine 100 MG tablet  Commonly known as:  SEROQUEL  Take 400 mg by mouth at bedtime.     rOPINIRole 4 MG tablet  Commonly known as:  REQUIP  Take 4 mg by mouth at bedtime.     simvastatin 40 MG tablet  Commonly known as:  ZOCOR  Take 40 mg by mouth at bedtime.     sucralfate 1 GM/10ML suspension  Commonly known as:  CARAFATE  Take 10 mL (1 g total) by mouth 2 (two) times daily.     Vitamin D (Ergocalciferol) 50000 UNITS Caps capsule  Commonly known as:  DRISDOL  Take 50,000 Units by mouth every 7 (seven) days. On fridays       Allergies  Allergen Reactions  . Aspirin Other (See Comments)    Causes nosebleeds  . Penicillins Itching  . Theophylline Nausea And Vomiting   Follow-up Information   Follow up with [name], MD On 06/29/2013. (3:15 PM .  office will call with date and time for lab work in October. )    Specialty:  Gastroenterology   Contact information:   [address] [place] [address] [phone]        The results of significant diagnostics from this hospitalization (including imaging, microbiology, ancillary and laboratory) are listed below for reference.    Significant Diagnostic Studies: Dg Chest 2 View  05/22/2013   *RADIOLOGY REPORT*  Clinical Data: Shortness of breath.  CHEST - 2 VIEW  Comparison: 05/10/2013 and prior chest radiographs  Findings: The cardiomediastinal silhouette is unremarkable. Mild peribronchial thickening is stable. Lingular scarring is again noted. There is no evidence of focal airspace disease, pulmonary edema, suspicious pulmonary nodule/mass,  pleural effusion, or pneumothorax. No acute bony abnormalities are identified.  IMPRESSION: No evidence of acute cardiopulmonary disease.   Original Report Authenticated By: [name] Pier, M.D.   Dg Chest 2 View  05/10/2013   CLINICAL DATA:  Shortness of Breath. Cough.  EXAM: CHEST  2 VIEW  COMPARISON:  04/24/2013  FINDINGS: Cardiomegaly noted with mild airway thickening. Stable scarring in the left mid lung. No significant pleural effusion.  IMPRESSION: 1. Stable cardiomegaly with stable lingular scarring. 2. Airway thickening is present, suggesting bronchitis or reactive airways disease.   Electronically Signed   By: Herbie Baltimore   On: 05/10/2013 12:07    Microbiology: No results found for this or any previous visit (from the past 240 hour(s)).   Labs: Basic Metabolic Panel:  Recent Labs Lab 05/22/13 2209 05/23/13 0640 05/24/13 0345 05/25/13 0514 05/26/13 0535  NA 133* 137 139 138 137  K 4.4 3.8 4.0 3.9 4.0  CL 99 102 102 101 100  CO2 24 27 26 29 30   GLUCOSE 376* 211* 151* 182* 79  BUN 42* 44* 42* 37* 43*  CREATININE 1.16* 1.24* 1.33* 1.24* 1.64*  CALCIUM 8.3* 8.5 8.8 8.8 8.8   Liver Function Tests:  Recent Labs Lab 05/22/13 2209  AST 41*  ALT 41*  ALKPHOS 133*  BILITOT 0.6  PROT 5.7*  ALBUMIN 2.7*   No results found for this basename: LIPASE, AMYLASE,  in the last 168 hours No results found for this basename: AMMONIA,  in the last 168 hours CBC:  Recent Labs Lab 05/22/13 2209  05/23/13 0640 05/23/13 1615 05/24/13 0345 05/25/13 0514 05/26/13 0535  WBC 3.1*  --  3.4*  --  3.0* 3.9*  --   NEUTROABS 2.3  --   --   --   --   --   --  HGB 7.7*  < > 7.8* 8.4* 7.8* 8.2* 7.9*  HCT 23.4*  < > 23.8* 25.6* 24.1* 25.0* 24.0*  MCV 98.3  --  96.7  --  96.8 96.5  --   PLT 80*  --  77*  --  72* 84*  --   < > = values in this interval not displayed. Cardiac Enzymes:  Recent Labs Lab 05/22/13 2209  TROPONINI <0.30   BNP: BNP (last 3 results)  Recent Labs   04/24/13 1805 05/10/13 1034 05/22/13 2209  PROBNP 147.3* 72.0 155.3*   CBG:  Recent Labs Lab 05/26/13 0640 05/26/13 1108 05/26/13 1613 05/26/13 1719 05/26/13 1736  GLUCAP 74 144* 97 67* 117*       Signed:  FELIZ ORTIZ, Shalah Estelle  [facility] 05/27/2013, 12:47 PM

## 2013-05-31 ENCOUNTER — Ambulatory Visit (INDEPENDENT_AMBULATORY_CARE_PROVIDER_SITE_OTHER): Payer: Medicare Other | Admitting: Pulmonary Disease

## 2013-05-31 ENCOUNTER — Other Ambulatory Visit (INDEPENDENT_AMBULATORY_CARE_PROVIDER_SITE_OTHER): Payer: Medicare Other

## 2013-05-31 ENCOUNTER — Encounter: Payer: Self-pay | Admitting: *Deleted

## 2013-05-31 VITALS — BP 128/68 | HR 51 | Temp 98.4°F | Ht 64.0 in | Wt 228.6 lb

## 2013-05-31 DIAGNOSIS — I359 Nonrheumatic aortic valve disorder, unspecified: Secondary | ICD-10-CM

## 2013-05-31 DIAGNOSIS — E7800 Pure hypercholesterolemia, unspecified: Secondary | ICD-10-CM

## 2013-05-31 DIAGNOSIS — D509 Iron deficiency anemia, unspecified: Secondary | ICD-10-CM

## 2013-05-31 DIAGNOSIS — R0600 Dyspnea, unspecified: Secondary | ICD-10-CM

## 2013-05-31 DIAGNOSIS — J449 Chronic obstructive pulmonary disease, unspecified: Secondary | ICD-10-CM

## 2013-05-31 DIAGNOSIS — I5033 Acute on chronic diastolic (congestive) heart failure: Secondary | ICD-10-CM

## 2013-05-31 DIAGNOSIS — J4489 Other specified chronic obstructive pulmonary disease: Secondary | ICD-10-CM

## 2013-05-31 DIAGNOSIS — I10 Essential (primary) hypertension: Secondary | ICD-10-CM

## 2013-05-31 DIAGNOSIS — K31819 Angiodysplasia of stomach and duodenum without bleeding: Secondary | ICD-10-CM

## 2013-05-31 DIAGNOSIS — R0609 Other forms of dyspnea: Secondary | ICD-10-CM

## 2013-05-31 DIAGNOSIS — R531 Weakness: Secondary | ICD-10-CM

## 2013-05-31 DIAGNOSIS — R0989 Other specified symptoms and signs involving the circulatory and respiratory systems: Secondary | ICD-10-CM

## 2013-05-31 DIAGNOSIS — F333 Major depressive disorder, recurrent, severe with psychotic symptoms: Secondary | ICD-10-CM

## 2013-05-31 DIAGNOSIS — E119 Type 2 diabetes mellitus without complications: Secondary | ICD-10-CM

## 2013-05-31 DIAGNOSIS — N3090 Cystitis, unspecified without hematuria: Secondary | ICD-10-CM

## 2013-05-31 DIAGNOSIS — R5381 Other malaise: Secondary | ICD-10-CM

## 2013-05-31 DIAGNOSIS — K219 Gastro-esophageal reflux disease without esophagitis: Secondary | ICD-10-CM

## 2013-05-31 LAB — BASIC METABOLIC PANEL
BUN: 26 mg/dL — ABNORMAL HIGH (ref 6–23)
CO2: 28 mEq/L (ref 19–32)
Calcium: 8.8 mg/dL (ref 8.4–10.5)
Calcium: 8.9 mg/dL (ref 8.4–10.5)
Creatinine, Ser: 1.1 mg/dL (ref 0.4–1.2)
GFR: 51.14 mL/min — ABNORMAL LOW (ref >60.00)
GFR: 52.21 mL/min — ABNORMAL LOW (ref >60.00)
Glucose, Bld: 133 mg/dL — ABNORMAL HIGH (ref 70–99)
Glucose, Bld: 84 mg/dL (ref 70–99)
Potassium: 3.8 mEq/L (ref 3.5–5.1)
Potassium: 4 mEq/L (ref 3.5–5.1)
Sodium: 139 mEq/L (ref 135–145)
Sodium: 138 mEq/L (ref 135–145)

## 2013-05-31 LAB — IBC PANEL
Saturation Ratios: 26 % (ref 20.0–50.0)
Transferrin: 297.1 mg/dL (ref 212.0–360.0)

## 2013-05-31 LAB — URINALYSIS, ROUTINE W REFLEX MICROSCOPIC
Ketones, ur: NEGATIVE
Nitrite: NEGATIVE
Specific Gravity, Urine: 1.01 (ref 1.000–1.030)
Total Protein, Urine: NEGATIVE
Urine Glucose: NEGATIVE
pH: 6.5 (ref 5.0–8.0)

## 2013-05-31 MED ORDER — PANTOPRAZOLE SODIUM 40 MG PO TBEC: 40.0000 mg | DELAYED_RELEASE_TABLET | Freq: Two times a day (BID) | ORAL | Status: DC

## 2013-05-31 MED ORDER — CEPHALEXIN 500 MG PO CAPS: 500.0000 mg | ORAL_CAPSULE | Freq: Three times a day (TID) | ORAL | Status: DC

## 2013-05-31 NOTE — Patient Instructions (Addendum)
Today we updated your med list in our EPIC system...    Continue your current medications the same...  Today we did your follow up blood count & Iron level; we also did a Urinalysis & culture...    We will contact you w/ the results when available and then determine about more IV Iron etc...  We will arrange for some home therapy to help your strength & ambulation....  Let's plan a follow up visit in 69mo, sooner if needed for problems.Marland KitchenMarland Kitchen

## 2013-05-31 NOTE — Progress Notes (Signed)
Subjective:    Patient ID: [name], female    DOB: 1940/10/20, 72 y.o.   MRN: [id number]  HPI 72 y/o WF here for a follow up visit... she has mult med problems as noted below>   ~  Jan 13, 2012:  56mo ROV & post hosp visit> Hosp 3/29 - 11/26/11 w/ COPD exac & mult chronic medical issues; treated w/ Pred & Zithromax & improved;  disch on Oxygen 2L/min, Advair250Bid, Pred taper, Proair prn;  Now improved & approaching baseline, she has completed the Pred taper but noted incr SOB, cough, & yellow sputum==> she wants additional antibiotic rx & we wrote for Avelox x7d...  She has mult additional somatic complaints> dizzy, abd discomfort, RLS, etc; overall they note fewer good days and more bad days; he daugh has been in the hosp & it wears her out to go for a visit...     Hx DM on Lantus plus Metformin500mg - 2Bid, Glimepiride 4mg  Qam, & Januvia 100mg /d> BS=191, A1c=6.9 and she will maintain her current regimen + diet & try to incr exercise...    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    We reviewed her prob list, meds, xrays and labs> see below>>  LABS 5/13:  FLP- at goals on Simva40 x HDL=32;  Chems- ok x BS=191 A1c=6.9 LFTs=sl elev;  CBC- pancytopenic w/ Hg=9.8 Fe=53;  TSH=3.50  ~  February 24, 2012:  6wk ROV & [name] has experienced slowly incr blood sugars requiring incr Lantus doses- now at 40u daily & BS checks still in the 300s; in addition she is c/o worsening RLS- prev evaluated by DrDohmeier, Neurology & she was on Requip & Sinemet w/ better control but she stopped the Requip on her own some time ago w/ recurrent RLS symptoms...    Breathing is ok; BP controlled on Aten & Lasix; Chol prev ok on Simva40;  She remains on 6 psychotopic meds from Psychiatry, [name] but she never brings bottles & we don't have notes..    We  reviewed prob list, meds, xrays and labs> see below>> LABS 7/13:  Chems- ok x BS=339;  A1c=7.4 REC to slowly incr Lantus by 5u increments until FBS <150...  ~  May 26, 2012:  56mo ROV & [name] relates the following issues since last visit>>    She saw DrDohmeier for Neuro for f/u Tremors & she made several adjustments in her Psyche meds- stopped the Risperdal & decr the Seroquel to 100mg /d; pt notes that tremors are better but still signif (also on SinemetCR-Bid) & she claims "withdrawal" from the Risperdal cessation w/ fallx2/ "messed up my liver"/ "sugars went crazy"...  Initiation of Requip really has helped the RLS...    ?Labs by Neuro showed low iron by pt report (we don't have notes from DrDohmeier) & DrDBrodie ordered Iron infusion for pt- done 04/22/12 w/ 1000mg  Iron Dextran infused at day surg; she is due for f/u labs today> Hg=11.1  Fe=76    She is c/o choking while eating/ swallowing & she has f/u appt w/ DrDBrodie to see about repeat EGD w/ dilatation (last EGD 2011 showed normal esoph, GAVE syndrome w/ argon laser ablation)...    DM control "went crazy" she says> she has cut the Lantus to 25u/d, & still taking the Metform500-2Bid, Glim4/d, Januv100/d; BS at home varies betw 68-242 & labs  today showed BS=164  A1c= 5.0     We reviewed prob list, meds, xrays and labs> see below for updates >> OK Flu vaccine today... LABS 10/13:  Chems- ok x BS=164 A1c=5.0;  CBC- ok x Hg=11.1 WBC=4.2 Plat=86 Fe=76 (23%)  ~  September 01, 2012:  449mo ROV & [name] is c/o a URI "cold in my chest" w/ cough, discolored phlegm "diff colors at diff times", chest congestion, etc; she notes- tired all the time, HH is bothering her, BS at home betw 170-200 range... We reviewed the following medical problems during today's office visit>>     COPD> on Home O2, Advair250, NEBS, Mucinex; remote smoker quit yrs ago; known obstructive & restrictive lung dis; she had ZPak recently but persists w/ c/o discolored phlegm, no f/c/s,  vague chest discomfort; last CXR 3/13 showed linear scarring left mid lung, otherw clear; CTA 3/13 was neg for PE & showed some atelec, coronary calcif, multilevel spondylosis, & cirrhosis of the liver; she declined f/u film today- we decided to treat w/ Levaquin, Mucinex, Fluids, Hycodan...    HBP> on Aten50, Lasix40, K20; BP= 118/82 & she has mult somatic complaints but no specific CP, palpit, ch in SOB or edema; continue low sodium & wt reduction.     CAD & mild AS/AI> known non-obstructive CAD; hx atypCP/ CWP; no angina & she remains way too sedentary; discussed risk factor reduction strategy & she saw DrCooper 7/13> stable, no angina, too sedentary; f/u 2DEcho w/ mild LVH, norm LVF w/ EF=55-60% & norm wall motion, mils AS & AI, Gr2DD...    Chol> on Simva40; not really on diet & we discussed this; last FLP 5/13 showed TChol 94, TG 130, HDL 32, LDL 36; continue same, needs exercise!    DM> on Lantus25, Metform1000Bid, Glimep2, [id number]; needs much better diet & exercise program! Not checking BS at home; labs from 10/13 showed BS=164, A1c=5.0 so we decreased the Glimep from 4mg  to 2mg  each AM...    Obesity> way too sedentary, ?on diet & her wt is up to 209# today; we discussed diet, exercise, wt reduction strategies...    << Chronic GI problems followed by DrDBrodie >>    {GERD, Angiodysplasia w/ chronic GI blood loss >> see below... 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 We will continue to check labs every 49mo w/ Iron infusions of Hg is reduced & ROV in 76mo...    DJD/ FM/ ?osteopenia/ VitD defic> she has mult somatic complaints & takes Tylenol prn; on VitD 50K weekly...    RLS> on Requip4Qhs & Sinemet25/100Bid from DrDohmeier w/ fair control of symptoms- her note is reviewed...     Anxiety/ Depression/ Psyche> on RX per Psychiatry DrHejazi (we don't have notes from him) & she does not know her meds, didn't bring med bottles or med list!  As best we can tell she is on Nuvigil, Pristiq, Wellbutrin, Risperdal, Seroquel, Xanax> all from Psychiatry & she is reminded to bring all bottles to every visit... We reviewed prob list, meds, xrays and labs> see below for updates >>  LABS 1/14:  CBC- Hg=7.0 (down 4pts) Hct=21.9 Fe=51 (11%sat) B12=623, Protime=11.6/ 1.1, AFP=pending; Abd sonar per DrDBrodie- pending...  ~  Dec 30, 2012:  26mo ROV & [name] is c/o incr SOB, having to wear her O2 all the time, and can't get a deep breath; she feels weak, states she's having a lot of nausea (wants phenergan refilled); she has had several interval visits w/ [facility] & DrDBrodie for f/u of her Cirrhosis (no ascites on sonar 1/14), watermelon stomach, GAVE syndrome, & chr blood loss anemia; on Protonix40 & had EGD w/ Argon Laser ablation of gastric AVM 1/14, followed by IV Iron therapy for Hg=7; her Hg improved to 9.3 then fell back to 8.8 & remained there (Fe improved from 51 to 177 after the infusion & has settled back to 70 since then)...  DrDBrodie asked that we recheck her from the Pulm standpoint due to her SOB>>     COPD> on Home O2, Advair250, NEBS, Mucinex; remote smoker quit yrs ago; known obstructive & restrictive lung dis; last CXR 2/14 showed cardiomeg, no pulm edema, clear lungs, NAD; CTA 3/13 was neg for PE & showed some atelec, coronary calcif, multilevel spondylosis, & cirrhosis of the liver; she saw TP 3/14 w/ incr cough, thick yellow sput, congestion & post nasal drip> took Avelox & Mucinex w/ some improvement;  We decided to evaluate further>>  PFT today showed FVC=2.41 (83%), FEV1=1.77 (80%), %1sec=74, mid-flows= 61% predicted (all c/w min obstructive dis, +superimposed restriction)...  Ambulatory O2 sats showed 93% sat on RA at rest w/ HR=60; 87% sat after one lap on RA w/ HR=62... Other  factors include 12# weight gain, weight =221#, very sedentary lifestyle, anemia (she did not want me to recheck this as it is being followed by DrDBrodie), and severe anxiety...  She thinks a lot of her SOB is from CHF siting her wt gain etc but a recent BNP 3/14 was ok at 180, she has a follow up appt w/ DrCooper soon...  I told her that in my opinion the biggest current issue (and most improvable factor) is anxiety & "chest wall muscle spasm" making her tight & hard to get a good breath "IN" as she described it; I went through my long explanation for this problems & told her that the BEST therapy for this was a "combination relaxer" like KLONOPIN & rec that she try 1mg - 1/2 to 1 tab bid... All questions answered & she is agreeable to this plan... ADDENDUM >> pt called back & indicated that after reading the Pharm handout that she decided NOT to take the Klonopin, doesn't want a substitute...  ~  February 25, 2013:  70mo ROV & post hosp check> since I last saw [name] 70mo ago- she has been Adm 3 times by [facility]>>   Adm 5/25-27 for weakness assoc w/ IDA & GIB> Hx cirrhosis, angiodysplasia, and had EGD showing sm varicies, portal gastropathy, mult AVMs w/ laser ablation; Hg was 7.7=> 2uPCs & improved to 9 range; subseq capsule endoscopy showed AVMs in upper sm intestines as well... Adm 6/9-12 for N&V assoc w/ constip & UTI (sens EColi- treated w/ Cipro) and underlying cirrhosis, portal gastropathy, angiodysplasia, etc (treated w/ PPI & monitoring of Hg & Fe- given 2uPRBCs & Fe infusions as outpt)...  Adm 6/24-27 for DiastolicCHF (CXR- vasc congestion; 2DEcho- EF=55-60%, Gr2DD, mildAS/AI; Lasix added)...    COPD> on Home O2, Advair250, NEBS, Mucinex; remote smoker quit yrs ago; known obstructive & restrictive lung dis; CXR 6/14 showed mild cardiomeg, mild vasc congestion, linear atx at left base, otherw clear; CTA 3/13 was neg for PE & showed some atelec, coronary calcif, multilevel spondylosis, &  cirrhosis of the  liver; she continues on O2 by [place] at 3L/min...     HBP> on Aten50, Lisinopril5, Lasix20-3/d, K10Qod; BP= 130/70 & she has mult somatic complaints but no specific CP, palpit, ch in SOB or edema; continue low sodium & wt reduction.     CAD, DiastolicCHF, & mild AS/AI> known non-obstructive CAD; hx atypCP/ CWP; no angina & she remains way too sedentary; discussed risk factor reduction strategy & she saw DrCooper 5/14> stable, no angina, too sedentary; last 2DEcho 7/13 w/ mild LVH, norm LVF w/ EF=55-60% & norm wall motion, mild AS&AI, Gr2DD;  EKG 5/14 w/ NSR, rate62, 1st degree AVB, NSSTTWA...    Chol> on Simva40; not really on diet & we discussed this; last FLP 5/13 showed TChol 94, TG 130, HDL 32, LDL 36; continue same, needs exercise!    DM> on Lantus20u & off all DM pills per DrEllison; needs much better diet & exercise program! Not checking BS at home; labs from 6/14 showed BS=169, A1c=6.7 & she will continue f/u w/ Endocrinology...    Obesity> way too sedentary, ?on diet & her wt is 218# today; we discussed diet, exercise, wt reduction strategies...    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    DJD/ FM/ ?osteopenia/ VitD defic> she has mult somatic complaints & takes Tylenol prn; on MVI + VitD 50K weekly...    RLS> on Requip4Qhs & Sinemet25/100Bid from DrDohmeier w/ fair control of symptoms- her note is reviewed...    Anxiety/ Depression/ PsycheHx> on RX per Psychiatry DrHejazi (we don't have notes from him) & she does not know her meds, didn't bring med bottles or med list!  As best we can tell she is on Nuvigil (on hold),  Pristiq100, WellbutrinXL150, Seroquel100-4Qhs, Xanax1mg  prn- all from Psychiatry & she is reminded to bring all bottles to every visit... We reviewed prob list, meds, xrays and labs> see below for updates >>  LABS 7/14:  CBC- Hg=8.8, MCV=93, WBC=3.2, Plat=74K;  Fe=58 (14%sat), Ferritin=12.5.Marland KitchenMarland Kitchen NOTE>> Fe is dropping & we decided to infuse FERAHEME 510mg  x2 as outpt... ROV 17mo.  ~  March 29, 2013:  17mo ROV & [name] received her IV Feraheme 510mg  x2 since our last visit; still c/o no energy & SOB; she went to the ER 1wk ago w/ UTI- given Cipro but cult returned EColi resist Cipro & switched to Keflex (sens);  Due for f/u labs today>> We reviewed prob list, meds, xrays and labs> see below for updates >>  CXR 7/14 showed norm heart size, clear lungs, no edema etc... LABS 8/14:  Chems- ok x BS=257 Cr=1.4;  CBC- Hg=8.8 Fe=138 (35%sat)...  Rec>> needs f/u appt w/ DrEllison for endocrine & plan f/u labs monthly...  ~  May 31, 2013:  24mo ROV & since I last saw [name] she has been to the ER twice, Hosp once, seen by Cards x3, GI x2, and DrEllison x2> complex story & a lot to get caught up on...    Hosp 9/27 - 05/27/13 by [facility] w/ incr SOB & DOE, found to  have Hg=7.7 w/ pos stools, had some nausea but no abd pain; given one unit, given IV Iron; & she had EGD by DrPyrtle- no varices, smHH, mild gastropathy, area of GAVE treated w/ APC, nodular mucosa (inflammed mucosa, neg HPylori, no dysplasia); Rec rx w/ PPI Bid & Carafate Qid, Aten changed to Corgard for better reduction in portal pressure...   CARDS>  BP stable at 128/68; she had f/u Cards- DrCooper 9/14> nonobstructive CAD, HBP, mildAS, diastolicCHF, HL, morbid obesity; Lasix had been added & adjusted, she is NYHA III, otherw no changes... DM>  Unfortunately her wt is up 14# to 229# today; followed by DrEllison & seen 9/14> DM, neuropathy, CAD; A1c interpret influenced by transfusions; they increased her Lantus to 200u daily...  GI>  Followed by DrDBrodie  & DrPyrtle- EGD as above, she had EGD w/ APC for her GAVE... ANEMIA> she received Tx 1u and IV Iron;           Problem List:  COPD (ICD-496) & BRONCHITIS, RECURRENT (ICD-491.9) - Ex-smoker w/ underlying COPD on home O2, ADVAIR 250Bid, ALBUTEROL (via nebs or MDI for Prn use), & MUCINEX 1-2Bid... she has combined obstructive AND restrictive disease (based on her obesity) w/ diet (weight reduction) + exercise strongly recommended to the pt...  ~  CTChest 5/06 = neg. ~  baseline CXR w/ bilat LL scarring, NAD.... last CXR 3/09 = unchanged, NAD. ~  PFTs 3/09 showed FVC= 2.47 (83%), FEV1= 1.65 (69%), %1sec= 67, mid-flows= 28%pred. ~  CXR 4/12 showed bilat scarring, NAD... ~  PFT 10/12 showed FVC= 2.28 (77%), FEV1= 1.46 (64%), %1sec= 64, mid-flows= 31% pred. ~  CXR 3/13 showed normal heart size, prom hilar regions, no infiltrates etc, NAD.[name]. ~  CTAngio Chest 3/13 showed neg for PE- scarring at lung bases, no adenopathy or lung lesions, calcified coronaries, multilevel spondylosis in TSpine, cirrhosis in liver ~  CXR 2/14 showed cardiomeg, no pulm edema, clear lungs, NAD ~  PFT 5/14 showed FVC=2.41 (83%), FEV1=1.77 (80%), %1sec=74, mid-flows= 61% predicted (all c/w min obstructive dis, +superimposed restriction) ~  Ambulatory O2 sats 5/14 showed 93% sat on RA at rest w/ HR=60; 87% sat after one lap on RA w/ HR=62 ~  7/14: on Home O2, Advair250, NEBS, Mucinex; remote smoker quit yrs ago; known obstructive & restrictive lung dis; CXR 6/14 showed mild cardiomeg, mild vasc congestion, linear atx at left base, otherw clear; CTA 3/13 was neg for PE & showed some atelec, coronary calcif, multilevel spondylosis, & cirrhosis of the liver; she continues on O2 by [place] at 3L/min...  ~  CXR 7/14 & 9/14 showed norm heart size, clear lungs, no edema etc..   HYPERTENSION (ICD-401.9) >>  ~  on ATENOLOL 50mg /d,  LASIX 40mg /d & K20/d...  ~  10/11:  BP=116/78 and she has mult somatic complaints but all are diminished  compared to prev visits; denies bad HA's, CP, palpit, syncope, edema, etc... ~  4/12:  BP= 140/60 and she persists w/ mult somatic complaints... ~  10/12:  BP= 122/74 & she has mult somatic complaints; needs better low sodium, calorie restricted diet & wt reduction... ~  3/13:  BP= 120/72 & she is unchanged symptomatically;  EKG 3/13 showed NSR, rate68, LAD, late transition, NSSTTWA... ~  7/13:  BP= 110/62 & she persists w/ mult somatic complaints... ~  10/13:  BP= 118/60 & she remains clinically stable... ~  7/14: on Aten50, Lisinopril5, Lasix20-3/d, K10Qod; BP= 130/70 & she has mult somatic complaints but no specific CP,  palpit, ch in SOB or edema; continue low sodium & wt reduction. ~  7/14: DrEllison stopped her Aten50; f/u BP on Lisin5, Lasix60, & K10Qod = 122/72... ~  9/14:  BP= 128/68 & she is improved...  CAD (ICD-414.00) - Hosp [facility] 4/09 w/ CP... cath showed non-obstructive CAD... no CP, palpit, etc (but she is way too sedentary). ~  cath 4/09 by DrCooper showed normLMAIN, 20% midLAD, norm CIRC, 20-30% proxRCA, EF= 50-55%... ~  EKG 1/11 showed SBrady, rate54, ?septal infarct, NSSTTWA, NAD... ~  EKG 3/13 showed NSR, rate68, LAD, late transition, NSSTTWA... ~  CTAngio Chest 3/13 showed calcif coronaries as noted... ~  DrCooper 7/13> stable, no angina, too sedentary; f/u 2DEcho w/ mild LVH, norm LVF w/ EF=55-60% & norm wall motion, mils AS & AI, Gr2DD... ~  9/14: she had f/u DrCooper w/ meds adjusted> Aten changed to Corgard, Lasix adjusted...  PALPITATIONS (ICD-785.1) >>  DIASTOLIC CHF >>  ~  eval by DrKlein w/ 7 beats WT on monitor... he changed Lisinopril to BBlocker (currently ATENOLOL 50mg /d) and improved... ~  she saw DrCopper for Cards f/u 1/11- palpit well controlled on Atenolol; hx non-obstructive CAD & good LVF; no change in Rx. ~  [facility] 6/14 w/ Diastolic CHF and Lasix adjusted- Disch on Lasix20-3/d  AORTIC STENOSIS (ICD-424.1) - she has mild Ao Valve disease w/ AS/  AI... followed by DrCooper. ~  2DEcho 11/06 showed mild ca++ AoV w/ mild AS, norm LVF w/ EF=55-65% ~  repeat 2DEcho 1/09 showed similar mild ca++ AoV w/ mild AS, mild AI, no regional wall motion abn, norm LVf w/ EF= 60%... NOTE: est PAsys= 35... ~  repeat 2DEcho 1/10 showed norm LVF w/ EF= 55-60%, no regional wall motion abn, mild DD, mod calcif AoV & mild reduced leaflet excursion & mild AI... ~  Repeat 2DEcho 7/13 showed norm LVF w/ EF=55-65%, no regional wall motion abn, Gr2DD, mildAS, mildAI, mild LAdil... ~  DrCooper 7/13> stable, no angina, too sedentary; f/u 2DEcho w/ mild LVH, norm LVF w/ EF=55-60% & norm wall motion, mils AS & AI, Gr2DD... ~  7/14: known non-obstructive CAD; hx atypCP/ CWP; no angina & she remains way too sedentary; discussed risk factor reduction strategy & she saw DrCooper 5/14> stable, no angina, too sedentary; last 2DEcho 7/13 w/ mild LVH, norm LVF w/ EF=55-60% & norm wall motion, mild AS&AI, Gr2DD;  EKG 5/14 w/ NSR, rate62, 1st degree AVB, NSSTTWA...  HYPERCHOLESTEROLEMIA (ICD-272.0) - on SIMVASTATIN 40mg /d... ~  FLP 6/09 showed TChol 107, TG 69, HDL 35, LDL 59 ~  FLP 3/10 showed TChol 128, TG 125, HDL 33, LDL 70 ~  FLP 12/10 showed TChol 98, TG 64, HDL 37, LDL 48... continue same. ~  FLP 10/11 showed TChol 124, TG 105, HDL 37, LDL 66 ~  FLP 4/12 showed TChol 122, TG 95, HDL 37, LDL 66 ~  FLP 10/12 on Simva40 showed TChol 110, TG 116, HDL 34, LDL 53 ~  FLP 5/13 on Simva40 showed TChol 94, TG 130, HDL 32, LDL 36  DM (ICD-250.00) - hx irreg control, not really on diet... on LANTUS per drEllison,  & odd METFORMIN, GLIMEPIRIDE, JANUVIA...  ~  labs 10/08 BS=198, HgA1C=7.2 ~  labs 1/09 showed BS=119, HgA1c=6.0 ~  labs 6/09 showed BS= 248, HgA1c= 7.0..[name] discussed need for starting insulin if she can't get on track and get wt down... ~  labs 9/09 showed BS= 236, HgA1c= 7.6.Marland KitchenMarland Kitchen rec- start Lantus 5u/d... grad increased to 10u. ~  labs  11/09 showed BS= 166, HgA1c= 6.0.Marland KitchenMarland Kitchen rec-  great! keep same. ~  labs 3/10 showed BS= 169, A1c= 6.8 ~  labs 12/10 showed BS= 149, A1c= 5.8, Umicroalb= neg. ~  labs 10/11 showed BS= 215, A1c= 7.1.[name]. rec> better diet, get wt down. ~  Labs 4/12 showed BS= 172, A1c=5.8 ~  Labs 10/12 showed BS= 245, A1c= 8.1.Marland KitchenMarland Kitchen rec to take meds every day; incr Lantus dose 10==> 20u/d... ~  Labs 3/13 on Lantus20+52meds showed BS= 276, A1c= 6.8 (improved)... ~  4/13:  No diabetic retinopathy per optometrist at [facility]... ~  Labs 5/13 on Lantus20+60meds showed BS= 191, A1c= 6.9 ==> subsequently BS incr & Lantus incr ~  Labs 7/13 on Lantus40+15meds showed BS= 339, A1c= 7.4.Marland KitchenMarland Kitchen ?why control is off, rec move shots around & incr by 5u/wk til BS<200. ~  Labs 10/13 on Lantus25+73meds showed BS= 164, A1c= 5.0.Marland KitchenMarland Kitchen Too tight & rec to decr Lantus20 & decr Glimep4mg  to 1/2 tab... ~  She has been adjusting her meds on her own in the interval => referred to DrEllison, Endocrinology... ~  7/14: on Lantus20u & off all DM pills per DrEllison; needs much better diet & exercise program! Not checking BS at home; labs from 6/14 showed BS=169, A1c=6.7 & she will continue f/u w/ Endocrinology. ~  7/14 f/u DrEllison & stopped all DM pills and increased her insulin to Lantus50Bid ~  9/14:  DrEllison increased her Lantus to 200u/d...  MORBID OBESITY (ICD-278.01) - not really on diet or exercise program... ~  weight 6/09 = 223# ~  weight 11/09 = 228# ~  weight 3/10 = 225# ~  weight 6/10 = 217# ~  weight 9/10 = 213# ~  weight 12/10 = 224# ~  weight 2/11 = 216# ~  weight 10/11 = 230# ~  Weight 4/12 = 222# ~  Weight 10/12 = 225# ~  Weight 3/13 = 215#...  great job, keep it up... 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  ~  EGD  2/06 showed angiodysplasia & mult gastric pseudopolyps, watermelon stomach improved from 2003. ~  recent f/u EGD 9/09 showed chr gastritis, no watermelon stomach & improved from prev... ~  recurrent anemia 12/10 w/ f/u EGD 2/11 showing gastric antral avm's- s/p argon laser ablation, no varicies etc... 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 ~  5/13:  She is not on PPI and c/o some abd discomfort- rec starting PROTONIX 40mg /d... ~  She saw DrDBrodie 10/13 w/ c/o dysphagia + f/u of her autoimmune liver dis, portal HTN, cirrhosis, "watermelon stomach" & hx argon laser ablation of gastric AVMs (chr GI blood loss w/ periodic Fe infusions- last 8/13); EGD 10/13 showed norm esoph mucosa, no varicies, spasm at the LES but no stricture found; dilated to 23F; mod severe portal hypertensive gastropathy in stomach, no active bleeding; REC> continue PPI, try Levsin... ~  1/14:  She had EGD & Argon Laser ablation of  gastric AVM by DrDBrodie... 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 ~  9/14:  She was Hosp by [facility] & seen by GI- drPyrtle w/ EGD (no varices, smHH, mild gastropathy, area of GAVE treated w/ APC, nodular mucosa (inflammed mucosa, neg HPylori, no dysplasia)...  DIVERTICULOSIS OF COLON (ICD-562.10) & POLYP, COLON (ICD-211.3) -  ~  last colonoscopy 10/08 w/ adenomatous polyp removed, and angiodysplasia without hemorrhage ~  CT Abd 10/08 showed Cirrhosis, borderline splenomeg, sl pancreatic atrophy & duct dil, sm umbil hernia, DJD sp, NAD... ~  10/13: she had f/u drDBrodie>  last colonoscopy was 2008, she felt pt was too high risk for prep & sedation for a f/u colonoscopy & they decided to hold off...  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 ~  10/11:  presents w/ dysuria & UTI Rx w/ Cipro... ~  12/11:  She had f/u DrGrapey... ~  6/14: she was treated for a sens EColi UTI during 6/14 Hosp w/ Cipro...  OSTEOARTHRITIS (ICD-715.90)  FIBROMYALGIA (ICD-729.1) - she persists w/ mult somatic complaints...  ? of OSTEOPOROSIS (ICD-733.00) - she had normal BMD in 2001 at [facility]..  RESTLESS LEGS SYNDROME (ICD-333.94) - she prev weaned off the Requip & remains on SINEMET-CR 25-100Bid... ~  9/10: now c/o recurrent restless leg symptoms... restart REQUIP 2mg  & titrate up. ~  12/10: persistant symptoms on 2mg Qhs... rec to incr to 4mg ... ~  2/11: she reports resolution of RLS on 4mg  Requip Qhs, resting well now. ~  12/11:  Recurrent RLS symptoms despite the Requip & seen by DrDohmeier w/ addition of Sinemet 25/100 Bid... ~  2012:  Stable on Requip4mg  & Sinemet 25/100 Bid... ~  7/13:  She is once again c/o incr RLS symptoms & rec to restart REQUIP 4mg /d...  ~  8/13: she had f/u visit w/ DrDohmeier> RLS, tremors, & insomnia; Seroquel helped for awhile, she is working w/ Community education officer to help her problems... ~  7/14: on Requip4Qhs & Sinemet25/100Bid from DrDohmeier (RLS, Trmeor, Gait abn) w/ fair control of symptoms- her note is reviewed & they stopped the Sinemet...  DYSTHYMIA (ICD-300.4) - Hx depression w/ psychotic  features and hosp 2/09 at [facility] w/ delusions of pedunculosis> she was prev treated by Dr. Bartholomew Crews & DrSuttenfield for counselling...  Adm to [facility] in May09 by DrBarbSmith w/ depression and meds adjusted (see DC summary)...  Now followed by [name] and [name] at [facility]- she was re-admitted 10/09 and meds changed to RISPERDAL, PRISTIQUE, BUPROPION, SEROQUEL, XANAX & Nuvigil (Armodafinil)...  husb indicates that she is much better at present & continues outpt Rx... ~  MRI/ [facility] Brain 2/08 showed sm vessel dis & atherosclerotic changes in post circ... ~  9/10: pt indicates that she is off the Trazodone... DrHejazi added Wellbutrin ?for her RLS? ~  2011:  pt  continues regular f/u w/ Psychiatry on numerous meds w/ freq med adjustments; she is requested to get notes from [facility] to our attention & bring all med bottles to her OVs for review... ~  2012:  We have not received any notes from [facility]; pt has not complied w/ our request for med bottles to be brought to each OV... ~  2013:  We still don't have accurate list of her current meds as we have not received [facility] notes & she NEVER brings meds to office despite our requests... ~  7/14: on RX per Psychiatry DrHejazi (we don't have notes from him) & she does not know her meds, didn't bring med bottles or med list!  As best we can tell she is on Nuvigil (on hold), Pristiq100, WellbutrinXL150, Seroquel100-4Qhs, Xanax1mg  prn- all from Psychiatry & she is reminded to bring all bottles to every visit...   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 ~  labs 04/18/08 by DrBrodie showed Hg= 10.4, Fe= 52... she ordered 1,200mg  IV Iron- given 04/26/08. ~  labs 05/19/08 showed Hg= 11.3, Fe= 117... ~  labs  11/09 showed Hg= 12.0.Marland KitchenMarland Kitchen ~  labs 3/10 showed Hg= 12.3, Fe= 79 ~  labs 6/10 showed Hg= 11.5, Fe= 73 ~  labs 12/10 showed Hg= 7.2, Fe= 28 (6%sat)... given [facility] & IV Fe per DrBrodie in Jan2012. ~  labs 2/11 showed Hg= 11.8, Fe= 95... pt indicates that DrBrodie will be following labs going forward... ~  labs 6/11 showed Hg= 11.2 ~  labs 9/11 showed Hg= 11.1, Fe= 106 ~  Labs 4/12 showed Hg=11.0, Fe=118 ~  Labs 10/12 showed Hg=9.5, Fe=35 (7%sat)... We will arrange for outpt infusion 1000mg  Fe... ~  Labs 12/12 showed Hg=11.4, Fe=92 (26%sat) ~  Labs 3/13 showed Hg=11.2, Fe=71 (17%sat) ~  Labs 5/13 showed Hg=9.8, Fe=53 (13%sat) ~  04/22/12:  She was given 1000mg  IV Iron Dextran by DrDBrodie... ~  Labs 10/13 showed Hg=11.1, Fe=76 (23%) ~  Labs 1/14 showed Hg=7.0 (down 4pts) Hct=21.9 Fe=51 (11%sat) B12=623, Protime=11.6/ 1.1, AFP=pending; Abd sonar per DrDBrodie- cirrhosis, no ascites. ~  Given IV Iron by DrDBrodie w/ Hg improved to 9.3 & Fe up to 177... ~  Subsequently her Hg settled back to 8.8 & Iron dropped to 70... The pt did not want me to recheck her labs- "DrBrodie does it" ~  [facility] 6/14 w/ further GIB and had EGD, Capsule Endo, etc- see above...  We are back to checking CBC, Fe monthly to check for needed iron infusions... ~  Labs 7/14 showed Hg= 8.8 and Fe= 58 (14%) so we decided to proceed w/ IV Fe Feraheme 510mg  x2 infusions... ~  Labs 8/14 s/p IV Feraheme x2 showed Hg= 8.8, Fe= 138 (35%sat); Rec to recheck labs Qmo... ~  9/14:  She was hosp by [facility] w/ SOB, anemia- Tx 1u, IV Fe given...   Past Surgical History  Procedure Laterality Date  . Hemorroidectomy    . Appendectomy    . Vaginal hysterectomy    . Breast biopsy      bilateral  . Cesarean section      x 3  . Appendectomy    . Esophagogastroduodenoscopy  06/12/2012    Procedure: ESOPHAGOGASTRODUODENOSCOPY (EGD);  Surgeon: Hart Carwin, MD;  Location: [facility];  Service: Endoscopy;  Laterality:  N/A;  . Balloon dilation   06/12/2012    Procedure: BALLOON DILATION;  Surgeon: Hart Carwin, MD;  Location: [facility] ENDOSCOPY;  Service: Endoscopy;  Laterality: N/A;  ?balloon  . Esophagogastroduodenoscopy  09/10/2012    Procedure: ESOPHAGOGASTRODUODENOSCOPY (EGD);  Surgeon: Hart Carwin, MD;  Location: [facility];  Service: Endoscopy;  Laterality: N/A;  . Hot hemostasis  09/10/2012    Procedure: HOT HEMOSTASIS (ARGON PLASMA COAGULATION/BICAP);  Surgeon: Hart Carwin, MD;  Location: [facility];  Service: Endoscopy;  Laterality: N/A;  . Esophagogastroduodenoscopy N/A 01/18/2013    Procedure: ESOPHAGOGASTRODUODENOSCOPY (EGD);  Surgeon: Hart Carwin, MD;  Location: [facility] ENDOSCOPY;  Service: Endoscopy;  Laterality: N/A;  . Esophagogastroduodenoscopy N/A 05/24/2013    Procedure: ESOPHAGOGASTRODUODENOSCOPY (EGD);  Surgeon: Beverley Fiedler, MD;  Location: [facility] ENDOSCOPY;  Service: Gastroenterology;  Laterality: N/A;  . Hot hemostasis N/A 05/24/2013    Procedure: HOT HEMOSTASIS (ARGON PLASMA COAGULATION/BICAP);  Surgeon: Beverley Fiedler, MD;  Location: [facility] ENDOSCOPY;  Service: Gastroenterology;  Laterality: N/A;    Outpatient Encounter Prescriptions as of 05/31/2013  Medication Sig Dispense Refill  . albuterol (PROAIR HFA) 108 (90 BASE) MCG/ACT inhaler Inhale 2 puffs into the lungs every 6 (six) hours as needed for shortness of breath.       . ALPRAZolam (XANAX) 1 MG tablet Take 1 mg by mouth 2 (two) times daily as needed for anxiety.       . AMBULATORY NON FORMULARY MEDICATION Continuous O2 @@ 3LMP      . b complex vitamins tablet Take 1 tablet by mouth daily.       . B-D ULTRAFINE III SHORT PEN 31G X 8 MM MISC USE AS DIRECTED  100 each  3  . buPROPion (WELLBUTRIN XL) 150 MG 24 hr tablet Take 150 mg by mouth daily.      [name] desvenlafaxine (PRISTIQ) 100 MG 24 hr tablet Take 150 mg by mouth daily. TAKE 150MG  DAILY      . Fluticasone-Salmeterol (ADVAIR DISKUS) 250-50 MCG/DOSE AEPB Inhale 1 puff into the lungs 2 (two) times daily.  28 each  0   . furosemide (LASIX) 20 MG tablet Take 2 tablets (40 mg total) by mouth 2 (two) times daily.      [name] glucose blood (FREESTYLE LITE) test strip 1 each by Other route 3 (three) times daily. And lancets 3/day 250.01  50 each  6  . Insulin Glargine 100 UNIT/ML SOPN Inject 200 Units into the skin daily. And pen needles 1/day      . lisinopril (PRINIVIL,ZESTRIL) 5 MG tablet Take 5 mg by mouth daily.      . Multiple Vitamins-Minerals (CENTRUM SILVER PO) Take by mouth daily.      . nadolol (CORGARD) 20 MG tablet Take 1 tablet (20 mg total) by mouth daily.  30 tablet  0  . pantoprazole (PROTONIX) 40 MG tablet Take 40 mg by mouth daily.      . polyethylene glycol (MIRALAX / GLYCOLAX) packet Take 17 g by mouth daily as needed (constipation).      . potassium chloride SA (K-DUR,KLOR-CON) 10 MEQ tablet Take 1 tablet (10 mEq total) by mouth every other day.  30 tablet  1  . promethazine (PHENERGAN) 25 MG tablet Take 25 mg by mouth 2 (two) times daily as needed for nausea.      [name] QUEtiapine (SEROQUEL) 100 MG tablet Take 400 mg by mouth at bedtime.       [name] rOPINIRole (REQUIP) 4 MG tablet Take  4 mg by mouth at bedtime.      . Saline (OCEAN NASAL SPRAY NA) Place 1 spray into the nose daily as needed (congestion).      . Simethicone (GAS-X PO) Take 1 tablet by mouth daily. Takes after the biggest meal of the day      . simvastatin (ZOCOR) 40 MG tablet Take 40 mg by mouth at bedtime.      . sucralfate (CARAFATE) 1 GM/10ML suspension Take 10 mL (1 g total) by mouth 2 (two) times daily.  420 mL  1  . Vitamin D, Ergocalciferol, (DRISDOL) 50000 UNITS CAPS capsule Take 50,000 Units by mouth every 7 (seven) days. On fridays       No facility-administered encounter medications on file as of 05/31/2013.    Allergies  Allergen Reactions  . Aspirin Other (See Comments)    Causes nosebleeds  . Penicillins Itching  . Theophylline Nausea And Vomiting    Current Medications, Allergies, Past Medical History, Past Surgical  History, Family History, and Social History were reviewed in [employer] record.    Review of Systems         See HPI - all other systems neg except as noted...      The patient complains of weight gain, dyspnea on exertion, muscle weakness, difficulty walking, and depression.  The patient denies anorexia, fever, weight loss, vision loss, decreased hearing, hoarseness, chest pain, syncope, peripheral edema, prolonged cough, headaches, hemoptysis, abdominal pain, melena, hematochezia, severe indigestion/heartburn, hematuria, incontinence, suspicious skin lesions, transient blindness, unusual weight change, abnormal bleeding, enlarged lymph nodes, and angioedema.     Objective:   Physical Exam     WD, Obese, 72 y/o WF in NAD... she is chr ill appearing... GENERAL:  Alert & oriented x 3... HEENT:  [place]/AT, pale conjuct, EOM-wnl, PERRLA, EACs-clear, TMs-wnl, NOSE-clear, THROAT-clear w/ dry MMs... NECK:  Supple w/ fairROM; no JVD; normal carotid impulses w/o bruits; no thyromegaly or nodules palpated; no lymphadenopathy. CHEST:  Clear to P & A; without wheezes/ rales/ or rhonchi heard... HEART:  Regular Rhythm;  gr 1/6 SEM without rubs or gallops... ABDOMEN:  Obese, soft & nontender w/ panniculus; normal bowel sounds; no organomegaly or masses detected. EXT: without deformities, mod arthritic changes; no varicose veins/ +venous insuffic/ tr edema. NEURO:  CN's intact;  no focal neuro deficits... DERM:  No lesions noted; no rash, pale complexion.  RADIOLOGY DATA:  Reviewed in the EPIC EMR & discussed w/ the patient...  LABORATORY DATA:  Reviewed in the EPIC EMR & discussed w/ the patient...   Assessment & Plan:    DYSPNEA> clearly this is multifactorial w/ components from Lung dis, CHF, Obesity, inactivity, Anemia, etc; a large component is likely related to anxiety & chest "tightness" for which she has Xanax 1mg  from her psychiatrists...  COPD>  Combined mild  obstructive & mod restrictive lung dis; PFT showed sl deterioration in FEV1; multifactorial dyspnea including stress; REC> continue Advair, NEBS, O2, Mucinex, etc; get on diet/ exercise/ get weight down; continue [facility]yche f/u for help w/ anxiety...  HBP>  Controlled on low dose BBlocker & ACE, plus diuretic> continue same & work on weight reduction...  CAD, DiastolicCHF>  Known nonobstructive dis, way too sedentary> we discussed diet + exercise...  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  CHOL>  Stable on the Simva40...  DM>  Managed by DrEllison on Lantus20u currently...  OBESITY>  Reviewed diet +  exercise needed...  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  Autoimmune hepatitis>  Also managed by DrBrodie, off the Immuran at present... She also has pancytopenia related to her cirrhosis.  RLS>  Notes from DrDohmeier reviewed> on Sinemet + needs to restart Requip...  Dysthymia>  followed by Psyche on mult meds...  Anemia>  We will place her back on our monthly protocol of CBC & Iron checks.Marland KitchenMarland Kitchen

## 2013-06-01 ENCOUNTER — Other Ambulatory Visit: Payer: Self-pay | Admitting: Pulmonary Disease

## 2013-06-01 DIAGNOSIS — D509 Iron deficiency anemia, unspecified: Secondary | ICD-10-CM

## 2013-06-01 LAB — URINE CULTURE: Colony Count: >=100000

## 2013-06-03 ENCOUNTER — Other Ambulatory Visit: Payer: Self-pay | Admitting: Internal Medicine

## 2013-06-04 ENCOUNTER — Telehealth: Payer: Self-pay | Admitting: Cardiovascular Disease

## 2013-06-04 NOTE — Telephone Encounter (Signed)
Pt currently being treated by Dr Kriste Basque for UTI and cough/cold sx. Pt c/o nose bleeds, cough/ yellow phlegm/ pt currently on Keflex. C/o worsening SOB with ambulation, no sob detected during speech. pts weight has been stable since DC from hosp 1 week ago. High sodium foods reviewed to avoid.  Pt has app 06/07/13 w/ PA Alben Spittle. Pt was advised to call dr Kriste Basque for advise with cough/ nose bleeds.

## 2013-06-04 NOTE — Telephone Encounter (Signed)
New problem     Pt is having SOB, stomach Distended a little nauseated and coughing a lot yellow .       Please call pt.   Thanks!

## 2013-06-06 ENCOUNTER — Other Ambulatory Visit: Payer: Self-pay | Admitting: Internal Medicine

## 2013-06-06 ENCOUNTER — Emergency Department (HOSPITAL_COMMUNITY): Payer: Medicare Other

## 2013-06-06 ENCOUNTER — Emergency Department (HOSPITAL_COMMUNITY)
Admission: EM | Admit: 2013-06-06 | Discharge: 2013-06-06 | Disposition: A | Discharge status: Home / Self Care | Payer: Medicare Other | Attending: Emergency Medicine | Admitting: Emergency Medicine

## 2013-06-06 ENCOUNTER — Encounter (HOSPITAL_COMMUNITY): Payer: Self-pay | Admitting: Emergency Medicine

## 2013-06-06 DIAGNOSIS — R142 Eructation: Secondary | ICD-10-CM | POA: Insufficient documentation

## 2013-06-06 DIAGNOSIS — Z8739 Personal history of other diseases of the musculoskeletal system and connective tissue: Secondary | ICD-10-CM | POA: Insufficient documentation

## 2013-06-06 DIAGNOSIS — Z860100 Personal history of colon polyps, unspecified: Secondary | ICD-10-CM | POA: Insufficient documentation

## 2013-06-06 DIAGNOSIS — R141 Gas pain: Secondary | ICD-10-CM | POA: Insufficient documentation

## 2013-06-06 DIAGNOSIS — E7800 Pure hypercholesterolemia, unspecified: Secondary | ICD-10-CM | POA: Insufficient documentation

## 2013-06-06 DIAGNOSIS — Z8701 Personal history of pneumonia (recurrent): Secondary | ICD-10-CM | POA: Insufficient documentation

## 2013-06-06 DIAGNOSIS — J441 Chronic obstructive pulmonary disease with (acute) exacerbation: Secondary | ICD-10-CM | POA: Insufficient documentation

## 2013-06-06 DIAGNOSIS — K219 Gastro-esophageal reflux disease without esophagitis: Secondary | ICD-10-CM | POA: Insufficient documentation

## 2013-06-06 DIAGNOSIS — E119 Type 2 diabetes mellitus without complications: Secondary | ICD-10-CM | POA: Insufficient documentation

## 2013-06-06 DIAGNOSIS — Z79899 Other long term (current) drug therapy: Secondary | ICD-10-CM | POA: Insufficient documentation

## 2013-06-06 DIAGNOSIS — F333 Major depressive disorder, recurrent, severe with psychotic symptoms: Secondary | ICD-10-CM | POA: Insufficient documentation

## 2013-06-06 DIAGNOSIS — Z880 Allergy status to penicillin: Secondary | ICD-10-CM | POA: Insufficient documentation

## 2013-06-06 DIAGNOSIS — I10 Essential (primary) hypertension: Secondary | ICD-10-CM | POA: Insufficient documentation

## 2013-06-06 DIAGNOSIS — G2581 Restless legs syndrome: Secondary | ICD-10-CM | POA: Insufficient documentation

## 2013-06-06 DIAGNOSIS — R0600 Dyspnea, unspecified: Secondary | ICD-10-CM

## 2013-06-06 DIAGNOSIS — R197 Diarrhea, unspecified: Secondary | ICD-10-CM | POA: Insufficient documentation

## 2013-06-06 DIAGNOSIS — D649 Anemia, unspecified: Secondary | ICD-10-CM | POA: Insufficient documentation

## 2013-06-06 DIAGNOSIS — J449 Chronic obstructive pulmonary disease, unspecified: Secondary | ICD-10-CM

## 2013-06-06 DIAGNOSIS — I509 Heart failure, unspecified: Secondary | ICD-10-CM | POA: Insufficient documentation

## 2013-06-06 DIAGNOSIS — F411 Generalized anxiety disorder: Secondary | ICD-10-CM | POA: Insufficient documentation

## 2013-06-06 DIAGNOSIS — I2510 Atherosclerotic heart disease of native coronary artery without angina pectoris: Secondary | ICD-10-CM | POA: Insufficient documentation

## 2013-06-06 DIAGNOSIS — Z794 Long term (current) use of insulin: Secondary | ICD-10-CM | POA: Insufficient documentation

## 2013-06-06 DIAGNOSIS — R011 Cardiac murmur, unspecified: Secondary | ICD-10-CM | POA: Insufficient documentation

## 2013-06-06 DIAGNOSIS — Q279 Congenital malformation of peripheral vascular system, unspecified: Secondary | ICD-10-CM | POA: Insufficient documentation

## 2013-06-06 DIAGNOSIS — M7989 Other specified soft tissue disorders: Secondary | ICD-10-CM | POA: Insufficient documentation

## 2013-06-06 DIAGNOSIS — Z87891 Personal history of nicotine dependence: Secondary | ICD-10-CM | POA: Insufficient documentation

## 2013-06-06 DIAGNOSIS — R5381 Other malaise: Secondary | ICD-10-CM

## 2013-06-06 DIAGNOSIS — Z87448 Personal history of other diseases of urinary system: Secondary | ICD-10-CM | POA: Insufficient documentation

## 2013-06-06 DIAGNOSIS — Z87440 Personal history of urinary (tract) infections: Secondary | ICD-10-CM | POA: Insufficient documentation

## 2013-06-06 LAB — CBC WITH DIFFERENTIAL/PLATELET
Basophils Absolute: 0 10*3/uL (ref 0.0–0.1)
Basophils Relative: 0 % (ref 0–1)
Eosinophils Relative: 4 % (ref 0–5)
HCT: 26.9 % — ABNORMAL LOW (ref 36.0–46.0)
Hemoglobin: 8.9 g/dL — ABNORMAL LOW (ref 12.0–15.0)
Lymphocytes Relative: 11 % — ABNORMAL LOW (ref 12–46)
MCHC: 33.1 g/dL (ref 30.0–36.0)
MCV: 96.8 fL (ref 78.0–100.0)
Monocytes Absolute: 0.5 10*3/uL (ref 0.1–1.0)
Platelets: 100 10*3/uL — ABNORMAL LOW (ref 150–400)
RDW: 16.9 % — ABNORMAL HIGH (ref 11.5–15.5)
WBC: 5.3 10*3/uL (ref 4.0–10.5)

## 2013-06-06 LAB — COMPREHENSIVE METABOLIC PANEL
AST: 54 U/L — ABNORMAL HIGH (ref 0–37)
CO2: 28 mEq/L (ref 19–32)
Calcium: 8.8 mg/dL (ref 8.4–10.5)
Chloride: 98 mEq/L (ref 96–112)
Creatinine, Ser: 1.14 mg/dL — ABNORMAL HIGH (ref 0.50–1.10)
GFR calc non Af Amer: 48 mL/min — ABNORMAL LOW (ref >90)
Sodium: 135 mEq/L (ref 135–145)
Total Bilirubin: 1 mg/dL (ref 0.3–1.2)
Total Protein: 6.2 g/dL (ref 6.0–8.3)

## 2013-06-06 LAB — PRO B NATRIURETIC PEPTIDE: Pro B Natriuretic peptide (BNP): 217.7 pg/mL — ABNORMAL HIGH (ref 0–125)

## 2013-06-06 MED ORDER — IPRATROPIUM BROMIDE 0.02 % IN SOLN: 0.5000 mg | Freq: Once | RESPIRATORY_TRACT | Status: DC

## 2013-06-06 MED ORDER — IPRATROPIUM BROMIDE 0.02 % IN SOLN: 0.5000 mg | Freq: Four times a day (QID) | RESPIRATORY_TRACT | Status: DC

## 2013-06-06 MED ORDER — IPRATROPIUM BROMIDE 0.02 % IN SOLN
0.5000 mg | Freq: Once | RESPIRATORY_TRACT | Status: AC
  Administered 2013-06-06: 0.5 mg via RESPIRATORY_TRACT
  Filled 2013-06-06: qty 2.5

## 2013-06-06 MED ORDER — ALBUTEROL SULFATE (5 MG/ML) 0.5% IN NEBU: 2.5000 mg | INHALATION_SOLUTION | Freq: Four times a day (QID) | RESPIRATORY_TRACT | Status: DC

## 2013-06-06 MED ORDER — ALBUTEROL SULFATE (5 MG/ML) 0.5% IN NEBU: 2.5000 mg | INHALATION_SOLUTION | RESPIRATORY_TRACT | Status: DC | PRN

## 2013-06-06 MED ORDER — ALBUTEROL SULFATE (5 MG/ML) 0.5% IN NEBU
5.0000 mg | INHALATION_SOLUTION | Freq: Once | RESPIRATORY_TRACT | Status: AC
  Administered 2013-06-06: 5 mg via RESPIRATORY_TRACT
  Filled 2013-06-06: qty 1

## 2013-06-06 MED ORDER — ACETAMINOPHEN 325 MG PO TABS
650.0000 mg | ORAL_TABLET | Freq: Once | ORAL | Status: AC
  Administered 2013-06-06: 650 mg via ORAL
  Filled 2013-06-06: qty 2

## 2013-06-06 NOTE — ED Notes (Signed)
This is a 72 yo with noted increasing shortness of breath worsening over the last several days above her baseline, hx copd lung sounds clear to ausclutation, nasal cannala 3l, blood drawn,

## 2013-06-06 NOTE — ED Notes (Addendum)
Recent admission, d/c'd ~ 2 weeks ago. Sx ongoing since d/c.  c/o sob, abd distention, constipation, diarrhea, nausea, leg swelling, chest and abd pain, (denies fever or vomiting). Alert, NAD, calm, interactive, resps e/u, speaking in clear complete sentences.

## 2013-06-06 NOTE — ED Provider Notes (Signed)
CSN: [id number]     Arrival date & time 06/06/13  2011 History   First MD Initiated Contact with Patient 06/06/13 2038     Chief Complaint  Patient presents with  . Shortness of Breath  . Bloated  . Leg Swelling  . Diarrhea   (Consider location/radiation/quality/duration/timing/severity/associated sxs/prior Treatment) Patient is a 72 y.o. female presenting with shortness of breath. The history is provided by the patient.  Shortness of Breath Severity:  Moderate Onset quality:  Gradual Timing:  Intermittent Progression:  Worsening Chronicity:  Recurrent Context: activity   Context: not URI   Relieved by:  Rest, oxygen, inhaler and diuretics Worsened by:  Exertion and movement Associated symptoms: sputum production   Associated symptoms: no abdominal pain, no chest pain, no cough, no ear pain, no fever, no headaches, no PND, no rash, no sore throat, no syncope and no vomiting   Risk factors: no obesity, no prolonged immobilization and no recent surgery     Past Medical History  Diagnosis Date  . Chronic airway obstruction, not elsewhere classified   . Unspecified chronic bronchitis   . Unspecified essential hypertension   . Coronary atherosclerosis of unspecified type of vessel, native or graft   . Palpitations   . Aortic valve disorders   . Pure hypercholesterolemia   . Morbid obesity   . Esophageal reflux   . Angiodysplasia of intestine (without mention of hemorrhage)   . Diverticulosis of colon (without mention of hemorrhage)   . Benign neoplasm of colon   . Autoimmune hepatitis   . Cystitis, unspecified   . Osteoarthrosis, unspecified whether generalized or localized, unspecified site   . Osteoporosis, unspecified   . Restless legs syndrome (RLS)   . Major depressive disorder, recurrent episode, severe, specified as with psychotic behavior   . Iron deficiency anemia secondary to blood loss (chronic)   . Coarse tremors     "just in my arms"  . Carpal tunnel  syndrome on both sides   . Dysrhythmia     palpitations  . CHF (congestive heart failure)   . Asthma   . Shortness of breath     "any time really"  . Sleep apnea     "but I don't use the equipment"  . Type II diabetes mellitus   . Blood transfusion   . H/O hiatal hernia   . 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 Adenomatous colon polyp   . Anginal pain   . Anxiety   . Pneumonia   . Heart murmur   . AVM (arteriovenous malformation)   . Portal hypertensive gastropathy   . Falls frequently   . UTI (lower urinary tract infection)   . Hiatal hernia   . GAVE (gastric antral vascular ectasia)    Past Surgical History  Procedure Laterality Date  . Hemorroidectomy    . Appendectomy    . Vaginal hysterectomy    . Breast biopsy      bilateral  . Cesarean section      x 3  . Appendectomy    . Esophagogastroduodenoscopy  06/12/2012    Procedure: ESOPHAGOGASTRODUODENOSCOPY (EGD);  Surgeon: Hart Carwin, MD;  Location: [facility];  Service: Endoscopy;  Laterality: N/A;  . Balloon dilation  06/12/2012    Procedure: BALLOON DILATION;  Surgeon: Hart Carwin, MD;  Location: [facility] ENDOSCOPY;  Service: Endoscopy;  Laterality: N/A;  ?balloon  . Esophagogastroduodenoscopy  09/10/2012    Procedure: ESOPHAGOGASTRODUODENOSCOPY (EGD);  Surgeon: Hart Carwin, MD;  Location: [facility] ENDOSCOPY;  Service: Endoscopy;  Laterality: N/A;  . Hot hemostasis  09/10/2012    Procedure: HOT HEMOSTASIS (ARGON PLASMA COAGULATION/BICAP);  Surgeon: Hart Carwin, MD;  Location: [facility];  Service: Endoscopy;  Laterality: N/A;  . Esophagogastroduodenoscopy N/A 01/18/2013    Procedure: ESOPHAGOGASTRODUODENOSCOPY (EGD);  Surgeon: Hart Carwin, MD;  Location: [facility] ENDOSCOPY;  Service: Endoscopy;  Laterality: N/A;  . Esophagogastroduodenoscopy N/A 05/24/2013    Procedure: ESOPHAGOGASTRODUODENOSCOPY (EGD);  Surgeon: Beverley Fiedler, MD;  Location: [facility] ENDOSCOPY;  Service: Gastroenterology;  Laterality: N/A;  . Hot hemostasis N/A  05/24/2013    Procedure: HOT HEMOSTASIS (ARGON PLASMA COAGULATION/BICAP);  Surgeon: Beverley Fiedler, MD;  Location: [facility] ENDOSCOPY;  Service: Gastroenterology;  Laterality: N/A;   Family History  Problem Relation Age of Onset  . Heart disease Mother   . Cervical cancer Mother   . Kidney disease Mother   . Diabetes Mother   . Breast cancer Sister   . Multiple sclerosis Sister   . Colon cancer Neg Hx   . Breast cancer Maternal Aunt     x 2  . Diabetes Father   . Diabetes Sister   . Multiple sclerosis Other    History  Substance Use Topics  . Smoking status: Former Smoker -- 1.50 packs/day for 25 years    Types: Cigarettes    Quit date: 08/26/1992  . Smokeless tobacco: Never Used  . Alcohol Use: No     Comment: 11/22/11 "last alcoholic drink was years ago"   OB History   Grav Para Term Preterm Abortions TAB SAB Ect Mult Living                 Review of Systems  Constitutional: Negative for fever.  HENT: Negative for ear pain and sore throat.   Respiratory: Positive for sputum production and shortness of breath. Negative for cough and chest tightness.   Cardiovascular: Negative for chest pain, syncope and PND.  Gastrointestinal: Negative for nausea, vomiting, abdominal pain, diarrhea and constipation.  Genitourinary: Negative for dysuria and difficulty urinating.  Musculoskeletal: Negative for arthralgias and myalgias.  Skin: Negative for color change, pallor, rash and wound.  Neurological: Negative for headaches.  All other systems reviewed and are negative.    Allergies  Aspirin; Penicillins; and Theophylline  Home Medications   Current Outpatient Rx  Name  Route  Sig  Dispense  Refill  . albuterol (PROAIR HFA) 108 (90 BASE) MCG/ACT inhaler   Inhalation   Inhale 2 puffs into the lungs every 6 (six) hours as needed for wheezing or shortness of breath.          . ALPRAZolam (XANAX) 1 MG tablet   Oral   Take 1 mg by mouth 3 (three) times daily as needed for anxiety.           . AMBULATORY NON FORMULARY MEDICATION      Continuous O2 @@ 3LMP         . b complex vitamins tablet   Oral   Take 1 tablet by mouth daily.          . B-D ULTRAFINE III SHORT PEN 31G X 8 MM MISC      USE AS DIRECTED   100 each   3   . buPROPion (WELLBUTRIN XL) 150 MG 24 hr tablet   Oral   Take 150 mg by mouth daily.         . cephALEXin (KEFLEX) 500 MG capsule   Oral  Take 500 mg by mouth 3 (three) times daily. For 10 days; Start date 05/31/13         . desvenlafaxine (PRISTIQ) 100 MG 24 hr tablet   Oral   Take 150 mg by mouth daily.         . Fluticasone-Salmeterol (ADVAIR DISKUS) 250-50 MCG/DOSE AEPB   Inhalation   Inhale 1 puff into the lungs 2 (two) times daily.   28 each   0   . furosemide (LASIX) 20 MG tablet   Oral   Take 2 tablets (40 mg total) by mouth 2 (two) times daily.         [name] glucose blood (FREESTYLE LITE) test strip   Other   1 each by Other route 3 (three) times daily. And lancets 3/day 250.01   50 each   6     Dx 250.00   . insulin glargine (LANTUS) 100 UNIT/ML injection   Subcutaneous   Inject 200 Units into the skin every morning.         [name] lisinopril (PRINIVIL,ZESTRIL) 5 MG tablet   Oral   Take 5 mg by mouth at bedtime.          . Multiple Vitamins-Minerals (CENTRUM SILVER PO)   Oral   Take by mouth daily.         . nadolol (CORGARD) 20 MG tablet   Oral   Take 1 tablet (20 mg total) by mouth daily.   30 tablet   0   . pantoprazole (PROTONIX) 40 MG tablet   Oral   Take 1 tablet (40 mg total) by mouth 2 (two) times daily.   60 tablet   11     Take 30 minutes before the 1st and last meal of th ...   . polyethylene glycol (MIRALAX / GLYCOLAX) packet   Oral   Take 17 g by mouth daily as needed (constipation).         . potassium chloride SA (K-DUR,KLOR-CON) 10 MEQ tablet   Oral   Take 1 tablet (10 mEq total) by mouth every other day.   30 tablet   1   . promethazine (PHENERGAN) 25 MG  tablet   Oral   Take 25 mg by mouth 2 (two) times daily as needed for nausea.         [name] QUEtiapine (SEROQUEL) 100 MG tablet   Oral   Take 400 mg by mouth at bedtime.          [name] rOPINIRole (REQUIP) 4 MG tablet   Oral   Take 4 mg by mouth at bedtime.         . Saline (OCEAN NASAL SPRAY NA)   Nasal   Place 1 spray into the nose daily as needed (congestion).         . Simethicone (GAS-X PO)   Oral   Take 1 tablet by mouth daily as needed (flatulence).          . simvastatin (ZOCOR) 40 MG tablet   Oral   Take 40 mg by mouth at bedtime.         . sucralfate (CARAFATE) 1 GM/10ML suspension   Oral   Take 10 mL (1 g total) by mouth 2 (two) times daily.   420 mL   1   . Vitamin D, Ergocalciferol, (DRISDOL) 50000 UNITS CAPS capsule   Oral   Take 50,000 Units by mouth every 7 (seven) days. On fridays  BP 164/61  Pulse 51  Temp(Src) 98.6 F (37 C) (Oral)  Resp 16  Wt 225 lb (102.059 kg)  BMI 38.6 kg/m2  SpO2 91% Physical Exam  Nursing note and vitals reviewed. Constitutional: She is oriented to person, place, and time. She appears well-developed and well-nourished. No distress.  Pleasant, obese female in no apparent distress.  HENT:  Head: Normocephalic and atraumatic.  Mouth/Throat: Oropharynx is clear and moist. No oropharyngeal exudate.  Eyes: Conjunctivae and EOM are normal. Pupils are equal, round, and reactive to light.  Neck: Normal range of motion. Neck supple.  Cardiovascular: Normal rate, regular rhythm and normal heart sounds.  Exam reveals no gallop and no friction rub.   No murmur heard. Pulmonary/Chest: Effort normal. No respiratory distress (on 2 L.). She has decreased breath sounds. She has no wheezes. She has no rales. She exhibits no tenderness.  Distant breath sounds bilaterally without wheezing  Abdominal: Soft. She exhibits no distension. There is tenderness in the left lower quadrant. There is no rigidity, no rebound, no guarding  and no CVA tenderness.  Mild left-sided tenderness. Obese.  Musculoskeletal: Normal range of motion. She exhibits edema (1+ pitting edema to the midshin.). She exhibits no tenderness.  Lymphadenopathy:    She has no cervical adenopathy.  Neurological: She is alert and oriented to person, place, and time.  Skin: Skin is warm and dry. No rash noted. She is not diaphoretic.  Psychiatric: She has a normal mood and affect. Her behavior is normal. Judgment and thought content normal.    ED Course  Procedures (including critical care time) Labs Review Labs Reviewed  COMPREHENSIVE METABOLIC PANEL - Abnormal; Notable for the following:    Glucose, Bld 103 (*)    Creatinine, Ser 1.14 (*)    Albumin 2.9 (*)    AST 54 (*)    ALT 56 (*)    Alkaline Phosphatase 146 (*)    GFR calc non Af Amer 48 (*)    GFR calc Af Amer 56 (*)    All other components within normal limits  CBC WITH DIFFERENTIAL - Abnormal; Notable for the following:    RBC 2.78 (*)    Hemoglobin 8.9 (*)    HCT 26.9 (*)    RDW 16.9 (*)    Platelets 100 (*)    Lymphocytes Relative 11 (*)    Lymphs Abs 0.6 (*)    All other components within normal limits  PRO B NATRIURETIC PEPTIDE - Abnormal; Notable for the following:    Pro B Natriuretic peptide (BNP) 217.7 (*)    All other components within normal limits  CLOSTRIDIUM DIFFICILE BY PCR  POCT I-STAT TROPONIN I   Imaging Review Dg Chest 2 View  06/06/2013   CLINICAL DATA:  Chest pain and shortness of breath.  EXAM: CHEST  2 VIEW  COMPARISON:  05/22/2013.  FINDINGS: Mild cardiac enlargement. Mild peribronchial thickening with lingular scarring. No focal airspace disease, pulmonary edema, pulmonary nodules, pleural effusion, or pneumothorax. Negative osseous structures.  IMPRESSION: Stable mild peribronchial thickening. No active infiltrates or failure.   Electronically Signed   By: Davonna Belling M.D.   On: 06/06/2013 21:16   Dg Abd 2 Views  06/06/2013   CLINICAL DATA:   Abdominal distention and shortness of breath.  EXAM: ABDOMEN - 2 VIEW  COMPARISON:  02/02/2013.  FINDINGS: The bowel gas pattern is shows no obstruction, or free air. No radio-opaque calculi or other significant radiographic abnormality is seen.  IMPRESSION: Negative.  Stable  from priors.   Electronically Signed   By: Davonna Belling M.D.   On: 06/06/2013 23:13    EKG Interpretation   None       Date: 06/06/2013  Rate: 58  Rhythm: sinus bradycardia  QRS Axis: left  Intervals: PR prolonged  ST/T Wave abnormalities: nonspecific ST changes  Conduction Disutrbances:first-degree A-V block   Narrative Interpretation:   Old EKG Reviewed: unchanged   MDM   1. COPD (chronic obstructive pulmonary disease)   2. Dyspnea   3. Physical deconditioning   4. Chronic anemia     The patient is a 72 year old female with a history of COPD, type 2 diabetes, hypertension, CHF who presents with worsening dyspnea. Patient was recently admitted to this institution and discharged one week ago for similar symptoms. At that time she was found to be anemic below baseline. She was admitted and hemoglobin stabilized without need for transfusion. Dyspnea was found to be secondary to CHF as well as COPD. She was found to be over diuresed and Lasix was lowered from 60 twice a day down to 40 twice a day. Patient presents today with a myriad of complaints, mostly dyspnea, dyspnea on exertion, abdominal bloating, diarrhea.  On arrival patient is afebrile and hemodynamically stable. During ED stay, second suture was obtained and was found to be 100.5 orally. Etiology and differential is broad, but includes COPD, CHF, anemia, pneumonia. Chest x-ray, labs, EKG obtained.  Chest x-ray without consolidation, effusion, pneumothorax. No signs of gross volume overload on chest x-ray or physical exam. CBC with no leukocytosis. Anemia with hemoglobin of 8.9 similar to previous. Thrombocytopenia at 100 similar to previous as well.  Troponin obtained in triage and negative. Plain film of abdomen obtained due to bloating obtained and without signs of obstruction. On repeat examination, breathing much improved and moving more air. No wheezing, but maintaining O2 sats on room air with much improvement in dyspnea. Feel this was likely COPD in etiology to dyspnea. Patient has PCP follow up as well as Cardiology follow up tomorrow. Based on reassuring findings and improved clinical exam, feel patient is stable for discharge.  Discussed with the patient return precautions and need for follow up with PCP. Patient voiced understanding. Stable for d/c. This patient was discussed with my attending, Dr. Romeo Apple.   Dorna Leitz, MD 06/06/13 2325

## 2013-06-06 NOTE — ED Notes (Signed)
Patient  C/os of excessive sweating, states she wants to have a bite to eat, given lunch bag,  Pt currently on room airs sats of 92%

## 2013-06-06 NOTE — ED Provider Notes (Addendum)
Medical screening examination/treatment/procedure(s) were conducted as a shared visit with resident physician and myself.  I personally evaluated the patient during the encounter. I personally reviewed and interpreted the ecg and agree with the residents interpretation.    I interviewed and examined the patient. Lungs are CTAB. Cardiac exam wnl. Abdomen soft. Pt appears well, abd soft. She is feeling better after albuterol. Labs/imaging non-contrib. Will d/c home.   Junius Argyle, MD 06/06/13 1610  Junius Argyle, MD 06/18/13 1115

## 2013-06-06 NOTE — ED Notes (Signed)
MD at bedside. Assessment of patient, pt undergoing respiratory management protocol

## 2013-06-06 NOTE — ED Notes (Signed)
Patient transported to X-ray 

## 2013-06-06 NOTE — ED Notes (Signed)
Patient transported to X-ray, completed her breathing treatment

## 2013-06-07 ENCOUNTER — Telehealth: Payer: Self-pay | Admitting: Pulmonary Disease

## 2013-06-07 ENCOUNTER — Ambulatory Visit (INDEPENDENT_AMBULATORY_CARE_PROVIDER_SITE_OTHER): Payer: Medicare Other | Admitting: Physician Assistant

## 2013-06-07 ENCOUNTER — Encounter: Payer: Self-pay | Admitting: Physician Assistant

## 2013-06-07 VITALS — BP 116/64 | HR 50 | Ht 64.0 in | Wt 226.0 lb

## 2013-06-07 DIAGNOSIS — D649 Anemia, unspecified: Secondary | ICD-10-CM

## 2013-06-07 DIAGNOSIS — I2510 Atherosclerotic heart disease of native coronary artery without angina pectoris: Secondary | ICD-10-CM

## 2013-06-07 DIAGNOSIS — I10 Essential (primary) hypertension: Secondary | ICD-10-CM

## 2013-06-07 DIAGNOSIS — I5033 Acute on chronic diastolic (congestive) heart failure: Secondary | ICD-10-CM

## 2013-06-07 DIAGNOSIS — I359 Nonrheumatic aortic valve disorder, unspecified: Secondary | ICD-10-CM

## 2013-06-07 DIAGNOSIS — J449 Chronic obstructive pulmonary disease, unspecified: Secondary | ICD-10-CM

## 2013-06-07 DIAGNOSIS — R0602 Shortness of breath: Secondary | ICD-10-CM

## 2013-06-07 DIAGNOSIS — I5032 Chronic diastolic (congestive) heart failure: Secondary | ICD-10-CM

## 2013-06-07 MED ORDER — FUROSEMIDE 20 MG PO TABS: ORAL_TABLET | ORAL | Status: DC

## 2013-06-07 NOTE — Telephone Encounter (Signed)
Verbal ok given for skilled nursing and OT. Carron Curie, CMA

## 2013-06-07 NOTE — Progress Notes (Signed)
[address] [place], [place]  [address] Phone: [phone] Fax:  [phone]  Date:  06/07/2013   ID:  [name], DOB 1941-04-07, MRN [id number]  PCP:  Michele Mcalpine, MD  Cardiologist:  Dr. Tonny Bollman     History of Present Illness: [name] is a 72 y.o. female who returns for follow up.   She has a hx of nonobstructive CAD, mild AS, diastolic CHF, morbid obesity, COPD on continuous O2, HTN, HL, sleep apnea, DM2, anemia with angiodysplasia with chronic GI blood loss, autoimmune hepatitis.  LHC 11/2007: Dx 20%, proximal and mid RCA 20-30%, EF 50-55%.  Echo 01/2013: EF 55-60%, grade 2 diastolic dysfunction, very mild aortic stenosis, mean gradient 9, mild AI, mild BAE, PASP 39.  Last admitted for a/c diastolic CHF 01/2013.    She was seen by Dr. Excell Seltzer 04/28/13 after a recent visit to the ED with weight gain and shortness of breath. CXR demonstrated edema.  She was given Lasix and discharged home.   I saw her in f/u 05/10/13.  She continued to complain of dyspnea. Followup chest x-ray demonstrated resolution of edema. BNP was normal. Creatinine was increased. I reduced her Lasix.  She was then admitted 9/27-10/2 with worsening anemia. She was transfused with PRBCs. EGD demonstrated GAVE which was treated with APC.  Of note, atenolol was changed to a non-selective beta blocker due to better reduction in portal pressures.  She has been seen in followup by her PCP.  Urinalysis was abnormal and she was placed on Keflex for UTI. Of note, she was also seen in the emergency room with worsening dyspnea yesterday.  She improved with nebulizer Tx.  CXR demonstrated no edema.  Hgb was stable.   She remains dyspneic. This is overall fairly stable. She is NYHA class III. She sleeps on 3 pillows chronically. She denies PND. She denies LE edema. She has occasional chest discomfort. This is chronic without change. She denies exertional discomfort. She denies syncope. She denies increased  wheezing or cough.  Labs (8/14):   K 4.4, creatinine 1.09, pro BNP 147.3, Hgb 9, PLT 79K Labs (9/14):   K 4.4, Cr 1.16, ALT 41, BNP 72, Hgb 7, A1c 8 Labs (10/14): K 3.9, Cr 1.14, ALT 56, Alb 2.9, BNP 217, Hgb 8.9   Wt Readings from Last 3 Encounters:  06/07/13 226 lb (102.513 kg)  06/06/13 225 lb (102.059 kg)  05/31/13 228 lb 9.6 oz (103.692 kg)     Past Medical History  Diagnosis Date  . Chronic airway obstruction, not elsewhere classified   . Unspecified chronic bronchitis   . Unspecified essential hypertension   . Coronary atherosclerosis of unspecified type of vessel, native or graft   . Palpitations   . Aortic valve disorders   . Pure hypercholesterolemia   . Morbid obesity   . Esophageal reflux   . Angiodysplasia of intestine (without mention of hemorrhage)   . Diverticulosis of colon (without mention of hemorrhage)   . Benign neoplasm of colon   . Autoimmune hepatitis   . Cystitis, unspecified   . Osteoarthrosis, unspecified whether generalized or localized, unspecified site   . Osteoporosis, unspecified   . Restless legs syndrome (RLS)   . Major depressive disorder, recurrent episode, severe, specified as with psychotic behavior   . Iron deficiency anemia secondary to blood loss (chronic)   . Coarse tremors     "just in my arms"  . Carpal tunnel syndrome on both sides   .  Dysrhythmia     palpitations  . CHF (congestive heart failure)   . Asthma   . Shortness of breath     "any time really"  . Sleep apnea     "but I don't use the equipment"  . Type II diabetes mellitus   . Blood transfusion   . H/O hiatal hernia   . 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 Adenomatous colon polyp   . Anginal pain   . Anxiety   . Pneumonia   . Heart murmur   . AVM (arteriovenous malformation)   . Portal hypertensive gastropathy   . Falls frequently   . UTI (lower urinary tract infection)   . Hiatal hernia   . GAVE (gastric antral vascular ectasia)     Current  Outpatient Prescriptions  Medication Sig Dispense Refill  . albuterol (PROAIR HFA) 108 (90 BASE) MCG/ACT inhaler Inhale 2 puffs into the lungs every 6 (six) hours as needed for wheezing or shortness of breath.       . ALPRAZolam (XANAX) 1 MG tablet Take 1 mg by mouth 3 (three) times daily as needed for anxiety.       . AMBULATORY NON FORMULARY MEDICATION Continuous O2 @@ 3LMP      . b complex vitamins tablet Take 1 tablet by mouth daily.       . B-D ULTRAFINE III SHORT PEN 31G X 8 MM MISC USE AS DIRECTED  100 each  3  . buPROPion (WELLBUTRIN XL) 150 MG 24 hr tablet Take 150 mg by mouth daily.      . cephALEXin (KEFLEX) 500 MG capsule Take 500 mg by mouth 3 (three) times daily. For 10 days; Start date 05/31/13      . desvenlafaxine (PRISTIQ) 100 MG 24 hr tablet Take 150 mg by mouth daily.      . Fluticasone-Salmeterol (ADVAIR DISKUS) 250-50 MCG/DOSE AEPB Inhale 1 puff into the lungs 2 (two) times daily.  28 each  0  . furosemide (LASIX) 20 MG tablet Take 2 tablets (40 mg total) by mouth 2 (two) times daily.      [name] glucose blood (FREESTYLE LITE) test strip 1 each by Other route 3 (three) times daily. And lancets 3/day 250.01  50 each  6  . insulin glargine (LANTUS) 100 UNIT/ML injection Inject 200 Units into the skin every morning.      [name] lisinopril (PRINIVIL,ZESTRIL) 5 MG tablet Take 5 mg by mouth at bedtime.       . Multiple Vitamins-Minerals (CENTRUM SILVER PO) Take by mouth daily.      . nadolol (CORGARD) 20 MG tablet Take 1 tablet (20 mg total) by mouth daily.  30 tablet  0  . pantoprazole (PROTONIX) 40 MG tablet Take 1 tablet (40 mg total) by mouth 2 (two) times daily.  60 tablet  11  . polyethylene glycol (MIRALAX / GLYCOLAX) packet Take 17 g by mouth daily as needed (constipation).      . potassium chloride SA (K-DUR,KLOR-CON) 10 MEQ tablet Take 1 tablet (10 mEq total) by mouth every other day.  30 tablet  1  . promethazine (PHENERGAN) 25 MG tablet Take 25 mg by mouth 2 (two) times daily as  needed for nausea.      [name] QUEtiapine (SEROQUEL) 100 MG tablet Take 400 mg by mouth at bedtime.       [name] rOPINIRole (REQUIP) 4 MG tablet Take 4 mg by mouth at bedtime.      [name]  Saline (OCEAN NASAL SPRAY NA) Place 1 spray into the nose daily as needed (congestion).      . Simethicone (GAS-X PO) Take 1 tablet by mouth daily as needed (flatulence).       . simvastatin (ZOCOR) 40 MG tablet Take 40 mg by mouth at bedtime.      . sucralfate (CARAFATE) 1 GM/10ML suspension Take 10 mL (1 g total) by mouth 2 (two) times daily.  420 mL  1  . Vitamin D, Ergocalciferol, (DRISDOL) 50000 UNITS CAPS capsule Take 50,000 Units by mouth every 7 (seven) days. On fridays       No current facility-administered medications for this visit.    Allergies:    Allergies  Allergen Reactions  . Aspirin Other (See Comments)    Causes nosebleeds  . Penicillins Itching  . Theophylline Nausea And Vomiting    Social History:  The patient  reports that she quit smoking about 20 years ago. Her smoking use included Cigarettes. She has a 37.5 pack-year smoking history. She has never used smokeless tobacco. She reports that she does not drink alcohol or use illicit drugs.   ROS:  Please see the history of present illness.   Her stools are always dark.  All other systems reviewed and negative.   PHYSICAL EXAM: VS:  BP 116/64  Pulse 50  Ht 5\' 4"  (1.626 m)  Wt 226 lb (102.513 kg)  BMI 38.77 kg/m2 Well nourished, well developed, in no acute distress HEENT: normal Neck: no JVD at 90 Cardiac:  normal S1, S2; RRR; 2/6 systolic murmur at the RUSB Lungs:  Decreased breath sounds bilaterally, no wheezing, rhonchi or rales Abd: distended, nontender, no hepatomegaly Ext: no edema Skin: warm and dry Neuro:  CNs 2-12 intact, no focal abnormalities noted  EKG:  Sinus bradycardia, HR 50, LAD, no change from prior tracing  ASSESSMENT AND PLAN:  1. Dyspnea:  This is chronic and multifactorial and related to anemia, diastolic CHF,  COPD, deconditioning. Her weight is up since last being seen. BNP in the emergency room yesterday was increased compared to prior assessment. I will adjust her Lasix to 60 mg in the morning and 40 mg in the evening. Continue follow up with pulmonology. 2. Chronic Diastolic CHF:  As noted, I will increase her Lasix. Check a basic metabolic panel in one week. 3. Hypertension:  Controlled. 4. CAD:  No angina. Continue statin. 5. Aortic Stenosis:  Very mild by recent echocardiogram. Continue clinical follow up. 6. COPD:  Overall, she seems to be tolerating the nonselective beta blocker. Continue follow up with pulmonology. Question if she would benefit from a home nebulizer. She can discuss this with Dr. Kriste Basque. 7. Anemia:  From chronic GI blood loss.  She will follow up with GI as planned.   8. Disposition:  She has [facility] scheduled to come out to her house today.  Follow up with me in 3 mos.   Signed, Tereso Newcomer, PA-C  06/07/2013 10:42 AM

## 2013-06-07 NOTE — Telephone Encounter (Signed)
lmomtcb x1 for danielle 

## 2013-06-07 NOTE — Patient Instructions (Addendum)
PLEASE FOLLOW UP WITH SCOTT WEAVER, PAC IN 3 MONTHS SAME DAY DR. Excell Seltzer IS IN THE OFFICE  INCREASE LASIX TO 60 MG IN THE AM AND 40 MG IN THE AFTERNOON  YOU HAVE A LAB APPT ON 06/14/13 @ 10:45

## 2013-06-10 ENCOUNTER — Ambulatory Visit (INDEPENDENT_AMBULATORY_CARE_PROVIDER_SITE_OTHER): Payer: Medicare Other | Admitting: Endocrinology

## 2013-06-10 ENCOUNTER — Other Ambulatory Visit: Payer: Self-pay | Admitting: Internal Medicine

## 2013-06-10 ENCOUNTER — Encounter: Payer: Self-pay | Admitting: Endocrinology

## 2013-06-10 VITALS — BP 120/70 | HR 60 | Wt 228.0 lb

## 2013-06-10 DIAGNOSIS — Z23 Encounter for immunization: Secondary | ICD-10-CM

## 2013-06-10 DIAGNOSIS — E119 Type 2 diabetes mellitus without complications: Secondary | ICD-10-CM

## 2013-06-10 NOTE — Patient Instructions (Addendum)
check your blood sugar twice a day.  vary the time of day when you check, between before the 3 meals, and at bedtime.  also check if you have symptoms of your blood sugar being too high or too low.  please keep a record of the readings and bring it to your next appointment here.  please call us sooner if your blood sugar goes below 70, or if you have a lot of readings over 200.  Please change the lantus to the evening. If this does not help, we can add another type of insulin in the evening.   we will need to take this complex situation in stages.   Please come back for a follow-up appointment in 1 month.

## 2013-06-10 NOTE — Progress Notes (Signed)
Subjective:    Patient ID: [name], female    DOB: 08-Dec-1940, 72 y.o.   MRN: [id number]  HPI pt returns for f/u of insulin-requiring DM (dx'ed 2004; she has moderate neuropathy of the lower extremities, and associated CAD; the interpretation of a1c's was limited by blood transfusions in early 2014; she needs a simple insulin regimen).  she brings a record of her cbg's which i have reviewed today.  It varies from 90-400.  It is lowest in the afternoon.   Past Medical History  Diagnosis Date  . Chronic airway obstruction, not elsewhere classified   . Unspecified chronic bronchitis   . Unspecified essential hypertension   . Coronary atherosclerosis of unspecified type of vessel, native or graft   . Palpitations   . Aortic valve disorders   . Pure hypercholesterolemia   . Morbid obesity   . Esophageal reflux   . Angiodysplasia of intestine (without mention of hemorrhage)   . Diverticulosis of colon (without mention of hemorrhage)   . Benign neoplasm of colon   . Autoimmune hepatitis   . Cystitis, unspecified   . Osteoarthrosis, unspecified whether generalized or localized, unspecified site   . Osteoporosis, unspecified   . Restless legs syndrome (RLS)   . Major depressive disorder, recurrent episode, severe, specified as with psychotic behavior   . Iron deficiency anemia secondary to blood loss (chronic)   . Coarse tremors     "just in my arms"  . Carpal tunnel syndrome on both sides   . Dysrhythmia     palpitations  . CHF (congestive heart failure)   . Asthma   . Shortness of breath     "any time really"  . Sleep apnea     "but I don't use the equipment"  . Type II diabetes mellitus   . Blood transfusion   . H/O hiatal hernia   . 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 Adenomatous colon polyp   . Anginal pain   . Anxiety   . Pneumonia   . Heart murmur   . AVM (arteriovenous malformation)   . Portal hypertensive gastropathy   . Falls frequently   . UTI (lower  urinary tract infection)   . Hiatal hernia   . GAVE (gastric antral vascular ectasia)     Past Surgical History  Procedure Laterality Date  . Hemorroidectomy    . Appendectomy    . Vaginal hysterectomy    . Breast biopsy      bilateral  . Cesarean section      x 3  . Appendectomy    . Esophagogastroduodenoscopy  06/12/2012    Procedure: ESOPHAGOGASTRODUODENOSCOPY (EGD);  Surgeon: Hart Carwin, MD;  Location: [facility];  Service: Endoscopy;  Laterality: N/A;  . Balloon dilation  06/12/2012    Procedure: BALLOON DILATION;  Surgeon: Hart Carwin, MD;  Location: [facility] ENDOSCOPY;  Service: Endoscopy;  Laterality: N/A;  ?balloon  . Esophagogastroduodenoscopy  09/10/2012    Procedure: ESOPHAGOGASTRODUODENOSCOPY (EGD);  Surgeon: Hart Carwin, MD;  Location: [facility];  Service: Endoscopy;  Laterality: N/A;  . Hot hemostasis  09/10/2012    Procedure: HOT HEMOSTASIS (ARGON PLASMA COAGULATION/BICAP);  Surgeon: Hart Carwin, MD;  Location: [facility];  Service: Endoscopy;  Laterality: N/A;  . Esophagogastroduodenoscopy N/A 01/18/2013    Procedure: ESOPHAGOGASTRODUODENOSCOPY (EGD);  Surgeon: Hart Carwin, MD;  Location: [facility] ENDOSCOPY;  Service: Endoscopy;  Laterality: N/A;  . Esophagogastroduodenoscopy N/A 05/24/2013    Procedure: ESOPHAGOGASTRODUODENOSCOPY (EGD);  Surgeon: Beverley Fiedler, MD;  Location: [facility] ENDOSCOPY;  Service: Gastroenterology;  Laterality: N/A;  . Hot hemostasis N/A 05/24/2013    Procedure: HOT HEMOSTASIS (ARGON PLASMA COAGULATION/BICAP);  Surgeon: Beverley Fiedler, MD;  Location: [facility] ENDOSCOPY;  Service: Gastroenterology;  Laterality: N/A;    History   Social History  . Marital Status: Married    Spouse Name: [name]    Number of Children: 4  . Years of Education: 12   Occupational History  . Disabled   .     Social History Main Topics  . Smoking status: Former Smoker -- 1.50 packs/day for 25 years    Types: Cigarettes    Quit date: 08/26/1992  . Smokeless  tobacco: Never Used  . Alcohol Use: No     Comment: 11/22/11 "last alcoholic drink was years ago"  . Drug Use: No  . Sexual Activity: No   Other Topics Concern  . Not on file   Social History Narrative   Patient lives at home with her husband [name]).    Retired.   Caffeine- None    Right handed.    Current Outpatient Prescriptions on File Prior to Visit  Medication Sig Dispense Refill  . albuterol (PROAIR HFA) 108 (90 BASE) MCG/ACT inhaler Inhale 2 puffs into the lungs every 6 (six) hours as needed for wheezing or shortness of breath.       . ALPRAZolam (XANAX) 1 MG tablet Take 1 mg by mouth 3 (three) times daily as needed for anxiety.       . AMBULATORY NON FORMULARY MEDICATION Continuous O2 @@ 3LMP      . b complex vitamins tablet Take 1 tablet by mouth daily.       . B-D ULTRAFINE III SHORT PEN 31G X 8 MM MISC USE AS DIRECTED  100 each  3  . buPROPion (WELLBUTRIN XL) 150 MG 24 hr tablet Take 150 mg by mouth daily.      . cephALEXin (KEFLEX) 500 MG capsule Take 500 mg by mouth 3 (three) times daily. For 10 days; Start date 05/31/13      . desvenlafaxine (PRISTIQ) 100 MG 24 hr tablet Take 150 mg by mouth daily.      . Fluticasone-Salmeterol (ADVAIR DISKUS) 250-50 MCG/DOSE AEPB Inhale 1 puff into the lungs 2 (two) times daily.  28 each  0  . furosemide (LASIX) 20 MG tablet Take 60 mg in the AM and 40 mg in the afternoon  90 tablet  6  . glucose blood (FREESTYLE LITE) test strip 1 each by Other route 3 (three) times daily. And lancets 3/day 250.01  50 each  6  . lisinopril (PRINIVIL,ZESTRIL) 5 MG tablet Take 5 mg by mouth at bedtime.       . Multiple Vitamins-Minerals (CENTRUM SILVER PO) Take by mouth daily.      . nadolol (CORGARD) 20 MG tablet Take 1 tablet (20 mg total) by mouth daily.  30 tablet  0  . pantoprazole (PROTONIX) 40 MG tablet Take 1 tablet (40 mg total) by mouth 2 (two) times daily.  60 tablet  11  . polyethylene glycol (MIRALAX / GLYCOLAX) packet Take 17 g by mouth  daily as needed (constipation).      . potassium chloride SA (K-DUR,KLOR-CON) 10 MEQ tablet Take 1 tablet (10 mEq total) by mouth every other day.  30 tablet  1  . promethazine (PHENERGAN) 25 MG tablet Take 25 mg by mouth 2 (two) times daily as needed for  nausea.      [name] QUEtiapine (SEROQUEL) 100 MG tablet Take 400 mg by mouth at bedtime.       [name] rOPINIRole (REQUIP) 4 MG tablet Take 4 mg by mouth at bedtime.      . Saline (OCEAN NASAL SPRAY NA) Place 1 spray into the nose daily as needed (congestion).      . Simethicone (GAS-X PO) Take 1 tablet by mouth daily as needed (flatulence).       . simvastatin (ZOCOR) 40 MG tablet Take 40 mg by mouth at bedtime.      . sucralfate (CARAFATE) 1 GM/10ML suspension Take 10 mL (1 g total) by mouth 2 (two) times daily.  420 mL  1  . Vitamin D, Ergocalciferol, (DRISDOL) 50000 UNITS CAPS capsule Take 50,000 Units by mouth every 7 (seven) days. On fridays       No current facility-administered medications on file prior to visit.    Allergies  Allergen Reactions  . Aspirin Other (See Comments)    Causes nosebleeds  . Penicillins Itching  . Theophylline Nausea And Vomiting    Family History  Problem Relation Age of Onset  . Heart disease Mother   . Cervical cancer Mother   . Kidney disease Mother   . Diabetes Mother   . Breast cancer Sister   . Multiple sclerosis Sister   . Colon cancer Neg Hx   . Breast cancer Maternal Aunt     x 2  . Diabetes Father   . Diabetes Sister   . Multiple sclerosis Other     BP 120/70  Pulse 60  Wt 228 lb (103.42 kg)  BMI 39.12 kg/m2  SpO2 95%  Review of Systems Denies LOC.  She has gained weight    Objective:   Physical Exam VITAL SIGNS:  See vs page GENERAL: no distress PSYCH: Alert and oriented x 3.  Does not appear anxious nor depressed.   Lab Results  Component Value Date   HGBA1C 8.0* 05/23/2013      Assessment & Plan:  DM: improved, but she needs increased rx.  This insulin regimen was chosen  from multiple options, for its simplicity.  The benefits of glycemic control must be weighed against the risks of hypoglycemia.  Based on the pattern of her cbg's, she needs some adjustment in her therapy.  CAD: in this setting, pt should avoid hypoglycemia.  COPD: this limits exercise rx of DM.

## 2013-06-11 ENCOUNTER — Other Ambulatory Visit: Payer: Self-pay | Admitting: Internal Medicine

## 2013-06-14 ENCOUNTER — Other Ambulatory Visit (INDEPENDENT_AMBULATORY_CARE_PROVIDER_SITE_OTHER): Payer: Medicare Other

## 2013-06-14 DIAGNOSIS — R0602 Shortness of breath: Secondary | ICD-10-CM

## 2013-06-14 DIAGNOSIS — I5033 Acute on chronic diastolic (congestive) heart failure: Secondary | ICD-10-CM

## 2013-06-14 DIAGNOSIS — I5032 Chronic diastolic (congestive) heart failure: Secondary | ICD-10-CM

## 2013-06-14 LAB — BASIC METABOLIC PANEL
BUN: 38 mg/dL — ABNORMAL HIGH (ref 6–23)
CO2: 30 mEq/L (ref 19–32)
Calcium: 9.1 mg/dL (ref 8.4–10.5)
Creatinine, Ser: 1.5 mg/dL — ABNORMAL HIGH (ref 0.4–1.2)
GFR: 35.41 mL/min — ABNORMAL LOW (ref >60.00)
Glucose, Bld: 111 mg/dL — ABNORMAL HIGH (ref 70–99)

## 2013-06-15 ENCOUNTER — Other Ambulatory Visit (HOSPITAL_COMMUNITY): Payer: Self-pay | Admitting: Pulmonary Disease

## 2013-06-15 ENCOUNTER — Telehealth: Payer: Self-pay | Admitting: *Deleted

## 2013-06-15 ENCOUNTER — Ambulatory Visit (HOSPITAL_COMMUNITY)
Admission: RE | Admit: 2013-06-15 | Discharge: 2013-06-15 | Disposition: A | Discharge status: Home / Self Care | Payer: Medicare Other | Source: Ambulatory Visit | Attending: Pulmonary Disease | Admitting: Pulmonary Disease

## 2013-06-15 ENCOUNTER — Telehealth: Payer: Self-pay | Admitting: Pulmonary Disease

## 2013-06-15 ENCOUNTER — Encounter (HOSPITAL_COMMUNITY): Payer: Self-pay

## 2013-06-15 VITALS — BP 124/58 | HR 53 | Temp 97.9°F | Resp 18

## 2013-06-15 DIAGNOSIS — I5033 Acute on chronic diastolic (congestive) heart failure: Secondary | ICD-10-CM

## 2013-06-15 DIAGNOSIS — D509 Iron deficiency anemia, unspecified: Secondary | ICD-10-CM | POA: Insufficient documentation

## 2013-06-15 MED ORDER — FERUMOXYTOL INJECTION 510 MG/17 ML
510.0000 mg | Freq: Once | INTRAVENOUS | Status: AC
  Administered 2013-06-15: 510 mg via INTRAVENOUS
  Filled 2013-06-15: qty 17

## 2013-06-15 MED ORDER — PROMETHAZINE HCL 25 MG PO TABS: 25.0000 mg | ORAL_TABLET | Freq: Two times a day (BID) | ORAL | Status: DC | PRN

## 2013-06-15 MED ORDER — SODIUM CHLORIDE 0.9 % IV SOLN
Freq: Once | INTRAVENOUS | Status: AC
  Administered 2013-06-15: 15:00:00 via INTRAVENOUS

## 2013-06-15 MED ORDER — FUROSEMIDE 20 MG PO TABS: ORAL_TABLET | ORAL | Status: DC

## 2013-06-15 NOTE — Telephone Encounter (Signed)
Pt notified about lab results and to decrease lasix to 40 mg twice daily; with bmet 06/25/13. Pt gave verbal read back to me with understanding

## 2013-06-15 NOTE — Telephone Encounter (Signed)
lmomtcb x1 for pt on named VM.  

## 2013-06-15 NOTE — Telephone Encounter (Signed)
Pt aware RX has been sent. Nothing further needed 

## 2013-06-15 NOTE — Telephone Encounter (Signed)
Pt returned call. [name] °

## 2013-06-16 ENCOUNTER — Telehealth: Payer: Self-pay

## 2013-06-16 ENCOUNTER — Telehealth: Payer: Self-pay | Admitting: Pulmonary Disease

## 2013-06-16 ENCOUNTER — Other Ambulatory Visit: Payer: Self-pay | Admitting: Pulmonary Disease

## 2013-06-16 NOTE — Telephone Encounter (Signed)
I spoke with [name] and made her aware of instructions per Tereso Newcomer PA-C. She is going to call the pt now and instruct her to decrease Lasix to 60mg  bid. [name] cannot go to the pt's home tomorrow for evaluation or draw labs.  She is going to move up the pt's check on Friday to first thing in the morning and will have labs resulted ASAP for our review.

## 2013-06-16 NOTE — Telephone Encounter (Signed)
[name] is chronically short of breath from many factors.  She has had fairly stable volume over the last couple visits. Last weight here was 226.  I was not aware that the [facility] RN had increased her Lasix beyond my recommendations. Decrease Lasix to 60 bid. Check BMET and BNP tomorrow. If patient's weight increases 3 lbs or more in 1 day, [name] should call our office for further instructions on how to take her Lasix. Any worsening dyspnea in [name] will be difficult to determine if it is coming from CHF, COPD, worsening anemia or some other factor.   Tereso Newcomer, PA-C   06/16/2013 2:41 PM

## 2013-06-16 NOTE — Telephone Encounter (Signed)
I spoke with [name] the [facility] nurse for this pt's care. [name] saw the pt on 06/11/13 for first home visit and the pt's baseline weight from hospitalization is 222 lbs.  On Friday during the visit the pt's weight was 226 lbs and the pt is having mild to moderate SOB and abdominal distention, no rales but does have decreased breath sounds. At that time the pt was taking Lasix 60 mg in the morning and 40 mg in the afternoon. Carroll instructed the pt to take a total of 160 mg on Friday and Saturday.  [name] touched base with the pt on Monday and weight was 223 lbs and pt still symptomatic.  Carroll instructed pt to take 160 mg total. Tuesday weight was 224 lbs and Carroll instructed pt to take 160mg  of lasix.  The pt did have labs in our office 10/20 and her BUN and Creatinine were elevated we instructed pt to decrease Lasix to 40mg  bid. The pt's weight today is 225 lbs and the pt continues to have SOB and abdominal distention.  [name] has already advised the pt to take Lasix 80mg  twice today.  [name] contacted the office because the pt needs better management of CHF to keep from being rehospitalized. [name] is planning on seeing the pt on Friday for a home visit and will go ahead and draw a BMP on the patient at that time. I will forward this message to Tereso Newcomer PA-C to review since the pt is being given conflicting instructions about her diuretics.  [name] would like a return phone call about how she should continue to advise this pt on CHF symptoms.

## 2013-06-16 NOTE — Telephone Encounter (Signed)
Called and spoke with nancy and she is aware ok for order for OT.  She will fax paper over for SN to sign.  Nothing further is needed.

## 2013-06-16 NOTE — Telephone Encounter (Signed)
New problem     Open case on 10/17 .    Discuss medication related to patient. Will explain further when RN/ MD

## 2013-06-22 ENCOUNTER — Telehealth: Payer: Self-pay | Admitting: Pulmonary Disease

## 2013-06-22 MED ORDER — LEVOFLOXACIN 500 MG PO TABS: 500.0000 mg | ORAL_TABLET | Freq: Every morning | ORAL | Status: DC

## 2013-06-22 NOTE — Telephone Encounter (Signed)
Called spoke with patient, advised of SN's recs as stated below Patient verbalized her understanding and denied any any questions at this time Pt advised to stop the Doxycycline and begin the Levaquin Rx sent to verified pharmacy Pt aware to call back if her symptoms do not improve or worsen ~~~~~~~~~~~~~~~~~~~~~~~~ When attempting to send the Levaquin, received a warning from Epic regarding the Levaquin and Seroquel on pt's chart: Significance: Major  Warning: Additive QT prolongation may occur during coadministration of QUEtiapine and levofloxacin. The official package labeling for QUEtiapine states that use of QUEtiapine with medications that prolong the QT interval should be avoided.  Onset: Rapid Document Level: Possible  Interacting Medications/Orders:  Quetiapine Oral or Non-Oral, Systemic QT Prolonging Agents 2 Oral or Non-Oral, Systemic  1. QUEtiapine Order ([id number]): QUEtiapine (SEROQUEL) 100 MG tablet Route: Oral Start: 11/22/2011 End: none Frequency: Daily at bedtime 1. levofloxacin Order: levofloxacin (LEVAQUIN) 500 MG tablet Route: Oral Start: 06/22/2013 End: none Frequency: Daily   Dr Kriste Basque please advise if you would still like this rx to be sent.  Thank you.

## 2013-06-22 NOTE — Telephone Encounter (Signed)
Called spoke with patient, advised to separate the Levaquin and Seroquel as recommended by SN below - pt verbalized her understanding.  Adjusted instructions with Levaquin to reflect this specific time recommendation.  Rx sent to verified pharmacy.  Nothing further needed; will sign off.

## 2013-06-22 NOTE — Telephone Encounter (Signed)
Per SN---  Pain---use heating pad to the area Cough with yellow sputum---call in levaquin 500 mg  #7  1 daily Add mucinex 500 mg  2 po bid

## 2013-06-22 NOTE — Telephone Encounter (Signed)
I left a message for [name] to call the office in regards to lab results.  I do not see any results in the pt's chart from 06/18/13.

## 2013-06-22 NOTE — Telephone Encounter (Signed)
Called spoke with patient who c/o pain in the bottom of her right lung and down the side x4 days - reports HHN advised to take doxycycline that she already had on hand >> pt has taken this BID x2 days.  Also reports increased SOB, wheezing, prod cough with small amounts of yellow mucus, chest tightness.  Denies f/c/s, nausea, vomiting, hemoptysis.  Reports that she has a history of UTI but denies dysuria, increased frequency, urgency, odor or darker colored urine.  Dr Kriste Basque please advise, thank you. Last ov 10.6.14 w/ SN, upcoming ov on 11.6.14 [facility] Allergies  Allergen Reactions  . Aspirin Other (See Comments)    Causes nosebleeds  . Penicillins Itching  . Theophylline Nausea And Vomiting

## 2013-06-22 NOTE — Telephone Encounter (Signed)
Per SN---  Take the levaquin in the am and the seroquel in the pm.

## 2013-06-23 ENCOUNTER — Telehealth: Payer: Self-pay | Admitting: Pulmonary Disease

## 2013-06-23 NOTE — Telephone Encounter (Signed)
Almetta Lovely, NP with [facility] called and is wanting Dr. Kirstie Mirza opinion on writing an RX for oxycodone for this pt to help with her rib pain. She is going to see the pt today and wants to have this in hand when she sees her. I advised her of SN recs yesterday for the pain. Please advise if ok for her to prescribe oxycodone. Carron Curie, CMA

## 2013-06-23 NOTE — Telephone Encounter (Signed)
I spoke with SN and he states it is ok for Okey Regal to write this RX but if more is needed in the future then she will need to continue writing this. He will not be able to write this for the pt. I spoke with Okey Regal and advised and she states she will do so and keep SN informed. Carron Curie, CMA

## 2013-06-24 NOTE — Telephone Encounter (Signed)
I spoke with [name] on 06/22/13 and she will fax results to our office.  She did already instruct the pt to decrease her lasix dose over the weekend due to elevation in BUN and Creatinine since she had instructed the pt to increase lasix multiple days. The pt continues to complain of SOB and [name] felt like her symptoms are related to COPD exacerbation.    I received a copy of lab results on 06/24/13 from 06/18/13 lab draw.  BUN 49, Creatinine 1.82, BNP 50.9.  I will review these results with Dr Excell Seltzer.

## 2013-06-24 NOTE — Telephone Encounter (Signed)
Dr Excell Seltzer reviewed the pt's labs and did not recommend any follow-up labs at this time since the pt's lasix dose was decreased back to 40mg  twice a day by [facility]. I will update the pt's medication list.

## 2013-06-25 ENCOUNTER — Encounter: Payer: Self-pay | Admitting: Adult Health

## 2013-06-25 ENCOUNTER — Ambulatory Visit (INDEPENDENT_AMBULATORY_CARE_PROVIDER_SITE_OTHER): Payer: Medicare Other | Admitting: Adult Health

## 2013-06-25 ENCOUNTER — Telehealth: Payer: Self-pay | Admitting: *Deleted

## 2013-06-25 ENCOUNTER — Other Ambulatory Visit (INDEPENDENT_AMBULATORY_CARE_PROVIDER_SITE_OTHER): Payer: Medicare Other

## 2013-06-25 VITALS — BP 136/62 | HR 60 | Temp 99.4°F

## 2013-06-25 DIAGNOSIS — I5033 Acute on chronic diastolic (congestive) heart failure: Secondary | ICD-10-CM

## 2013-06-25 DIAGNOSIS — I509 Heart failure, unspecified: Secondary | ICD-10-CM

## 2013-06-25 DIAGNOSIS — R21 Rash and other nonspecific skin eruption: Secondary | ICD-10-CM

## 2013-06-25 LAB — BASIC METABOLIC PANEL
BUN: 57 mg/dL — ABNORMAL HIGH (ref 6–23)
CO2: 28 mEq/L (ref 19–32)
Calcium: 9.1 mg/dL (ref 8.4–10.5)
Creatinine, Ser: 2 mg/dL — ABNORMAL HIGH (ref 0.4–1.2)
GFR: 26.34 mL/min — ABNORMAL LOW (ref >60.00)
Glucose, Bld: 98 mg/dL (ref 70–99)
Sodium: 134 mEq/L — ABNORMAL LOW (ref 135–145)

## 2013-06-25 MED ORDER — DESOXIMETASONE 0.05 % EX CREA: TOPICAL_CREAM | Freq: Two times a day (BID) | CUTANEOUS | Status: DC

## 2013-06-25 NOTE — Telephone Encounter (Signed)
Message copied by Tarri Fuller on Fri Jun 25, 2013  5:05 PM ------      Message from: [place], [place] T      Created: Fri Jun 25, 2013  1:30 PM       Chart indicates she takes Lasix 40 bid and K+ 10 once every other day.      Creatinine and BUN up - likely getting dry.      Hold Lasix x 1 day.      Then resume at Lasix 40 QD.      Check BMET in 1 week.      Weigh daily.  If weigh increases 2-3 lbs in one day, take extra Lasix 40 and call.      Tereso Newcomer, PA-C        06/25/2013 1:30 PM ------

## 2013-06-25 NOTE — Telephone Encounter (Signed)
pt notified about lab results and wll get repeat bmet 11/6 at Dr. Jodelle Green appt; pt aware to hold lasix  x 1 and will resume on Sunday her regular medication regimen. Pt verbalized Plan of Care

## 2013-06-25 NOTE — Patient Instructions (Signed)
May apply Topicort Twice daily  To rash spots.  IF not improving in next couple of weeks will need referral to dermatology  Follow up Dr. Kriste Basque  As planned and As needed

## 2013-06-27 ENCOUNTER — Other Ambulatory Visit: Payer: Self-pay | Admitting: Pulmonary Disease

## 2013-06-29 ENCOUNTER — Ambulatory Visit (INDEPENDENT_AMBULATORY_CARE_PROVIDER_SITE_OTHER): Payer: Medicare Other | Admitting: Internal Medicine

## 2013-06-29 ENCOUNTER — Encounter: Payer: Self-pay | Admitting: Internal Medicine

## 2013-06-29 ENCOUNTER — Other Ambulatory Visit (INDEPENDENT_AMBULATORY_CARE_PROVIDER_SITE_OTHER): Payer: Medicare Other

## 2013-06-29 VITALS — BP 110/56 | HR 72 | Ht 64.0 in | Wt 227.5 lb

## 2013-06-29 DIAGNOSIS — D509 Iron deficiency anemia, unspecified: Secondary | ICD-10-CM

## 2013-06-29 DIAGNOSIS — K7460 Unspecified cirrhosis of liver: Secondary | ICD-10-CM

## 2013-06-29 DIAGNOSIS — K31819 Angiodysplasia of stomach and duodenum without bleeding: Secondary | ICD-10-CM

## 2013-06-29 DIAGNOSIS — R21 Rash and other nonspecific skin eruption: Secondary | ICD-10-CM | POA: Insufficient documentation

## 2013-06-29 LAB — CBC WITH DIFFERENTIAL/PLATELET
Basophils Absolute: 0 10*3/uL (ref 0.0–0.1)
Basophils Relative: 0.4 % (ref 0.0–3.0)
Eosinophils Absolute: 0.2 10*3/uL (ref 0.0–0.7)
Hemoglobin: 8.7 g/dL — ABNORMAL LOW (ref 12.0–15.0)
Lymphocytes Relative: 8.7 % — ABNORMAL LOW (ref 12.0–46.0)
MCHC: 34.4 g/dL (ref 30.0–36.0)
Monocytes Absolute: 0.5 10*3/uL (ref 0.1–1.0)
Monocytes Relative: 10.8 % (ref 3.0–12.0)
Neutrophils Relative %: 75.8 % (ref 43.0–77.0)
Platelets: 80 10*3/uL — ABNORMAL LOW (ref 150.0–400.0)
RBC: 2.62 Mil/uL — ABNORMAL LOW (ref 3.87–5.11)
RDW: 17.2 % — ABNORMAL HIGH (ref 11.5–14.6)

## 2013-06-29 LAB — IBC PANEL
Iron: 129 ug/dL (ref 42–145)
Saturation Ratios: 35 % (ref 20.0–50.0)
Transferrin: 263.2 mg/dL (ref 212.0–360.0)

## 2013-06-29 NOTE — Patient Instructions (Signed)
Your physician has requested that you go to the basement for the following lab work before leaving today: CBC, IBC  CC: Dr Kriste Basque

## 2013-06-29 NOTE — Progress Notes (Signed)
Subjective:    Patient ID: [name], female    DOB: December 01, 1940, 72 y.o.   MRN: [id number]  HPI 72 y/o WF   ~  October 25, 2011:  70mo ROV & [name] is w/o complaints today; SEE 06/12/11 OV for prev note string...  We rechecked labs today (see below), no changes made...    COPD> on Home O2, Advair250, NEBS, Mucinex; remote smoker quit yrs ago; known obstructive & restrictive lung dis; she has lost 10# in the interval & feels as though her breathing is sl improved as a result; asked to continue the same meds for now.     HBP> on Aten50, Lasix40Bid, K20; BP= 120/72 & she has mult somatic complaints but no specific CP, palpit, ch in SOB or edema; continue low sodium 7 wt reduction.     CAD & AS/AI> known non-obstructive CAD; hx atypCP/ CWP; no angina & she remains way too sedentary; discussed risk factor reduction strategy & she has f/u Cards DrCooper soon.    Chol> on Simva40; not really on diet & we discussed this; last FLP 10/12 showed TChol 110, TG 116, HDL 34, LDL 53; continue same, needs exercise!    DM> on Lantus20, Metform1000Bid, Glimep4, Januvia100; needs much better diet & exercise program! Not checking BS at home; BS=276, A1c=6.8 (improved from prev) & Pharm indicates that she is filling pretty regularly...    Obesity> way too sedentary, ?on diet & she has finally lost a little wt (down 10# to 215# today); we discussed diet, exercise, wt reduction strategies...    {GERD, Angiodysplasia w/ chronic GI blood loss...    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 We will continue to check labs every 35mo & ROV in 91mo...    DJD/ FM/ ?osteopenia/ VitD  defic> she has mult somatic complaints & takes Tylenol prn; on VitD 50K weekly...    RLS> on Requip4Qhs & Sinemet25/100Bid from DrDohmeier w/ fair control of symptoms...    Anxiety/ Depression/ Psyche> on RX per Psychiatry DrHejazi (we don't have notes from him) & she does not know her meds, didn't bring med bottles or med list!  As best we can tell she is on Nuvigil, Pristiq, Wellbutrin, Risperdal, Seroquel, Xanax> all from Psychiatry & she is reminded to bring all bottles to every visit... LABS 3/13:  Chems- ok x BS=276 A1c=6.8;  CBC- Hg=11.2 Fe=71 WBC=4.2K, Plat=75K...  ~  Jan 13, 2012:  70mo ROV & post hosp visit> Hosp 3/29 - 11/26/11 w/ COPD exac & mult chronic medical issues; treated w/ Pred & Zithromax & improved;  disch on Oxygen 2L/min, Advair250Bid, Pred taper, Proair prn;  Now improved & approaching baseline, she has completed the Pred taper but noted incr SOB, cough, & yellow sputum==> she wants additional antibiotic rx & we wrote for Avelox x7d...  She has mult additional somatic complaints> dizzy, abd discomfort, RLS, etc; overall they note fewer good days and more bad days; he daugh has been in the hosp & it wears her out to go for a visit...     Hx DM on Lantus plus Metformin500mg - 2Bid,  Glimepiride 4mg  Qam, & Januvia 100mg /d> BS=191, A1c=6.9 and she will maintain her current regimen + diet & try to incr exercise...    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    We reviewed her prob list, meds, xrays and labs> see below>>  LABS 5/13:  FLP- at goals on Simva40 x HDL=32;  Chems- ok x BS=191 A1c=6.9 LFTs=sl elev;  CBC- pancytopenic w/ Hg=9.8 Fe=53;  TSH=3.50  ~  February 24, 2012:  70wk ROV & [name] has experienced slowly incr blood sugars requiring incr Lantus doses- now at 40u daily & BS checks still in the 300s; in addition she  is c/o worsening RLS- prev evaluated by DrDohmeier, Neurology & she was on Requip & Sinemet w/ better control but she stopped the Requip on her own some time ago w/ recurrent RLS symptoms...    Breathing is ok; BP controlled on Aten & Lasix; Chol prev ok on Simva40;  She remains on 6 psychotopic meds from Psychiatry, [name] but she never brings bottles & we don't have notes..    We reviewed prob list, meds, xrays and labs> see below>> LABS 7/13:  Chems- ok x BS=339;  A1c=7.4 REC to slowly incr Lantus by 5u increments until FBS <150...  ~  May 26, 2012:  70mo ROV & [name] relates the following issues since last visit>>    She saw DrDohmeier for Neuro for f/u Tremors & she made several adjustments in her Psyche meds- stopped the Risperdal & decr the Seroquel to 100mg /d; pt notes that tremors are better but still signif (also on SinemetCR-Bid) & she claims "withdrawal" from the Risperdal cessation w/ fallx2/ "messed up my liver"/ "sugars went crazy"...  Initiation of Requip really has helped the RLS...    ?Labs by Neuro showed low iron by pt report (we don't have notes from DrDohmeier) & DrDBrodie ordered Iron infusion for pt- done 04/22/12 w/ 1000mg  Iron Dextran infused at day surg; she is due for f/u labs today> Hg=11.1  Fe=76    She is c/o choking while eating/ swallowing & she has f/u appt w/ DrDBrodie to see about repeat EGD w/ dilatation (last EGD 2011 showed normal esoph, GAVE syndrome w/ argon laser ablation)...    DM control "went crazy" she says> she has cut the Lantus to 25u/d, & still taking the Metform500-2Bid, Glim4/d, Januv100/d; BS at home varies betw 68-242 & labs today showed BS=164  A1c= 5.0     We reviewed prob list, meds, xrays and labs> see below for updates >> OK Flu vaccine today... LABS 10/13:  Chems- ok x BS=164 A1c=5.0;  CBC- ok x Hg=11.1 WBC=4.2 Plat=86 Fe=76 (23%)  ~  September 01, 2012:  70mo ROV & [name] is c/o a URI "cold in my chest" w/ cough, discolored phlegm "diff colors  at diff times", chest congestion, etc; she notes- tired all the time, HH is bothering her, BS at home betw 170-200 range... We reviewed the following medical problems during today's office visit>>     COPD> on Home O2, Advair250, NEBS, Mucinex; remote smoker quit yrs ago; known obstructive & restrictive lung dis; she had ZPak recently but persists w/ c/o discolored phlegm, no f/c/s, vague chest discomfort; last CXR 3/13 showed linear scarring left mid lung, otherw clear; CTA 3/13 was neg for PE & showed some atelec, coronary calcif, multilevel spondylosis, &  cirrhosis of the liver; she declined f/u film today- we decided to treat w/ Levaquin, Mucinex, Fluids, Hycodan...    HBP> on Aten50, Lasix40, K20; BP= 118/82 & she has mult somatic complaints but no specific CP, palpit, ch in SOB or edema; continue low sodium & wt reduction.     CAD & mild AS/AI> known non-obstructive CAD; hx atypCP/ CWP; no angina & she remains way too sedentary; discussed risk factor reduction strategy & she saw DrCooper 7/13> stable, no angina, too sedentary; f/u 2DEcho w/ mild LVH, norm LVF w/ EF=55-60% & norm wall motion, mils AS & AI, Gr2DD...    Chol> on Simva40; not really on diet & we discussed this; last FLP 5/13 showed TChol 94, TG 130, HDL 32, LDL 36; continue same, needs exercise!    DM> on Lantus25, Metform1000Bid, Glimep2, GMWNUUV253; needs much better diet & exercise program! Not checking BS at home; labs from 10/13 showed BS=164, A1c=5.0 so we decreased the Glimep from 4mg  to 2mg  each AM...    Obesity> way too sedentary, ?on diet & her wt is up to 209# today; we discussed diet, exercise, wt reduction strategies...    << Chronic GI problems followed by DrDBrodie >>    {GERD, Angiodysplasia w/ chronic GI blood loss >> see below... F/u EGD 10/13     {Divertics, polyps              {Autoimmune hepatitis    {Pancytopenia, Fe defic anemia> on Priotonix40/d & Phenergan prn; Intol/ doesn't absorb oral iron preps; She is  pancytopenic due to her autoimmune liver dis, LFTs have been wnl... We will continue to check labs every 52mo w/ Iron infusions of Hg is reduced & ROV in 63mo...    DJD/ FM/ ?osteopenia/ VitD defic> she has mult somatic complaints & takes Tylenol prn; on VitD 50K weekly...    RLS> on Requip4Qhs & Sinemet25/100Bid from DrDohmeier w/ fair control of symptoms- her note is reviewed...    Anxiety/ Depression/ Psyche> on RX per Psychiatry DrHejazi (we don't have notes from him) & she does not know her meds, didn't bring med bottles or med list!  As best we can tell she is on Nuvigil, Pristiq, Wellbutrin, Risperdal, Seroquel, Xanax> all from Psychiatry & she is reminded to bring all bottles to every visit... We reviewed prob list, meds, xrays and labs> see below for updates >>  LABS 1/14:  CBC- Hg=7.0 (down 4pts) Hct=21.9 Fe=51 (11%sat) B12=623, Protime=11.6/ 1.1, AFP=pending; Abd sonar per DrDBrodie- pending...  ~  Dec 30, 2012:  63mo ROV & [name] is c/o incr SOB, having to wear her O2 all the time, and can't get a deep breath; she feels weak, states she's having a lot of nausea (wants phenergan refilled); she has had several interval visits w/ [facility] & DrDBrodie for f/u of her Cirrhosis (no ascites on sonar 1/14), watermelon stomach, GAVE syndrome, & chr blood loss anemia; on Protonix40 & had EGD w/ Argon Laser ablation of gastric AVM 1/14, followed by IV Iron therapy for Hg=7; her Hg improved to 9.3 then fell back to 8.8 & remained there (Fe improved from 51 to 177 after the infusion & has settled back to 70 since then)...  DrDBrodie asked that we recheck her from the Pulm standpoint due to her SOB>>     COPD> on Home O2, Advair250, NEBS, Mucinex; remote smoker quit yrs ago; known obstructive & restrictive lung dis; last CXR 2/14 showed cardiomeg, no pulm edema, clear lungs, NAD; CTA 3/13  was neg for PE & showed some atelec, coronary calcif, multilevel spondylosis, & cirrhosis of the liver; she saw TP 3/14 w/ incr  cough, thick yellow sput, congestion & post nasal drip> took Avelox & Mucinex w/ some improvement;  We decided to evaluate further>>  PFT today showed FVC=2.41 (83%), FEV1=1.77 (80%), %1sec=74, mid-flows= 61% predicted (all c/w min obstructive dis, +superimposed restriction)...  Ambulatory O2 sats showed 93% sat on RA at rest w/ HR=60; 87% sat after one lap on RA w/ HR=62... Other factors include 12# weight gain, weight =221#, very sedentary lifestyle, anemia (she did not want me to recheck this as it is being followed by DrDBrodie), and severe anxiety...  She thinks a lot of her SOB is from CHF siting her wt gain etc but a recent BNP 3/14 was ok at 180, she has a follow up appt w/ DrCooper soon...  I told her that in my opinion the biggest current issue (and most improvable factor) is anxiety & "chest wall muscle spasm" making her tight & hard to get a good breath "IN" as she described it; I went through my long explanation for this problems & told her that the BEST therapy for this was a "combination relaxer" like KLONOPIN & rec that she try 1mg - 1/2 to 1 tab bid... All questions answered & she is agreeable to this plan... ADDENDUM >> pt called back & indicated that after reading the Pharm handout that she decided NOT to take the Klonopin, doesn't want a substitute...  ~  February 25, 2013:  54mo ROV & post hosp check> since I last saw [name] 54mo ago- she has been Adm 3 times by [facility]>>   Adm 5/25-27 for weakness assoc w/ IDA & GIB> Hx cirrhosis, angiodysplasia, and had EGD showing sm varicies, portal gastropathy, mult AVMs w/ laser ablation; Hg was 7.7=> 2uPCs & improved to 9 range; subseq capsule endoscopy showed AVMs in upper sm intestines as well... Adm 6/9-12 for N&V assoc w/ constip & UTI (sens EColi- treated w/ Cipro) and underlying cirrhosis, portal gastropathy, angiodysplasia, etc (treated w/ PPI & monitoring of Hg & Fe- given 2uPRBCs & Fe infusions as outpt)...  Adm 6/24-27 for DiastolicCHF (CXR-  vasc congestion; 2DEcho- EF=55-60%, Gr2DD, mildAS/AI; Lasix added)...    COPD> on Home O2, Advair250, NEBS, Mucinex; remote smoker quit yrs ago; known obstructive & restrictive lung dis; CXR 6/14 showed mild cardiomeg, mild vasc congestion, linear atx at left base, otherw clear; CTA 3/13 was neg for PE & showed some atelec, coronary calcif, multilevel spondylosis, & cirrhosis of the liver; she continues on O2 by  at 3L/min...     HBP> on Aten50, Lisinopril5, Lasix20-3/d, K10Qod; BP= 130/70 & she has mult somatic complaints but no specific CP, palpit, ch in SOB or edema; continue low sodium & wt reduction.     CAD, DiastolicCHF, & mild AS/AI> known non-obstructive CAD; hx atypCP/ CWP; no angina & she remains way too sedentary; discussed risk factor reduction strategy & she saw DrCooper 5/14> stable, no angina, too sedentary; last 2DEcho 7/13 w/ mild LVH, norm LVF w/ EF=55-60% & norm wall motion, mild AS&AI, Gr2DD;  EKG 5/14 w/ NSR, rate62, 1st degree AVB, NSSTTWA...    Chol> on Simva40; not really on diet & we discussed this; last FLP 5/13 showed TChol 94, TG 130, HDL 32, LDL 36; continue same, needs exercise!    DM> on Lantus20u & off all DM pills per DrEllison; needs much better diet & exercise program! Not checking  BS at home; labs from 6/14 showed BS=169, A1c=6.7 & she will continue f/u w/ Endocrinology...    Obesity> way too sedentary, ?on diet & her wt is 218# today; we discussed diet, exercise, wt reduction strategies...    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    DJD/ FM/ ?osteopenia/ VitD defic> she has mult somatic  complaints & takes Tylenol prn; on MVI + VitD 50K weekly...    RLS> on Requip4Qhs & Sinemet25/100Bid from DrDohmeier w/ fair control of symptoms- her note is reviewed...    Anxiety/ Depression/ PsycheHx> on RX per Psychiatry DrHejazi (we don't have notes from him) & she does not know her meds, didn't bring med bottles or med list!  As best we can tell she is on Nuvigil (on hold), Pristiq100, WellbutrinXL150, Seroquel100-4Qhs, Xanax1mg  prn- all from Psychiatry & she is reminded to bring all bottles to every visit... We reviewed prob list, meds, xrays and labs> see below for updates >>  LABS 7/14:  CBC- Hg=8.8, MCV=93, WBC=3.2, Plat=74K;  Fe=58 (14%sat), Ferritin=12.5.Marland KitchenMarland Kitchen NOTE>> Fe is dropping & we decided to infuse [facility] 510mg  x2 as outpt... ROV 82mo.  ~  March 29, 2013:  82mo ROV & [name] received her IV Feraheme 510mg  x2 since our last visit; still c/o no energy & SOB; she went to the ER 1wk ago w/ UTI- given Cipro but cult returned EColi resist Cipro & switched to Keflex (sens);  Due for f/u labs today>> We reviewed prob list, meds, xrays and labs> see below for updates >>  CXR 7/14 showed norm heart size, clear lungs, no edema etc... LABS 8/14:  Chems- ok x BS=257 Cr=1.4;  CBC- Hg=8.8 Fe=138 (35%sat)...  Rec>> needs f/u appt w/ DrEllison for endocrine & plan f/u labs monthly...    06/25/13 Acute OV  Complains of red, raised, itching rash x10-12 days on the arms, on the torso x4-5 days and new onset of rash on the legs this AM.  low grade temp. Has had several times over last several months. Minimal pruritis .  No new meds, skin or detergent products.  Not on palms or soles. No oral involvement. No increased dyspnea, choking , dysphagia.  Has been using cortisone otc which helps.            Problem List:  COPD (ICD-496) & BRONCHITIS, RECURRENT (ICD-491.9) - Ex-smoker w/ underlying COPD on home O2, ADVAIR 250Bid, ALBUTEROL (via nebs or MDI for Prn use), & MUCINEX 1-2Bid... she has combined  obstructive AND restrictive disease (based on her obesity) w/ diet (weight reduction) + exercise strongly recommended to the pt...  ~  CTChest 5/06 = neg. ~  baseline CXR w/ bilat LL scarring, NAD.... last CXR 3/09 = unchanged, NAD. ~  PFTs 3/09 showed FVC= 2.47 (83%), FEV1= 1.65 (69%), %1sec= 67, mid-flows= 28%pred. ~  CXR 4/12 showed bilat scarring, NAD... ~  PFT 10/12 showed FVC= 2.28 (77%), FEV1= 1.46 (64%), %1sec= 64, mid-flows= 31% pred. ~  CXR 3/13 showed normal heart size, prom hilar regions, no infiltrates etc, NAD.[name]. ~  CTAngio Chest  3/13 showed neg for PE- scarring at lung bases, no adenopathy or lung lesions, calcified coronaries, multilevel spondylosis in TSpine, cirrhosis in liver ~  CXR 2/14 showed cardiomeg, no pulm edema, clear lungs, NAD ~  PFT 5/14 showed FVC=2.41 (83%), FEV1=1.77 (80%), %1sec=74, mid-flows= 61% predicted (all c/w min obstructive dis, +superimposed restriction) ~  Ambulatory O2 sats 5/14 showed 93% sat on RA at rest w/ HR=60; 87% sat after one lap on RA w/ HR=62 ~  7/14: on Home O2, Advair250, NEBS, Mucinex; remote smoker quit yrs ago; known obstructive & restrictive lung dis; CXR 6/14 showed mild cardiomeg, mild vasc congestion, linear atx at left base, otherw clear; CTA 3/13 was neg for PE & showed some atelec, coronary calcif, multilevel spondylosis, & cirrhosis of the liver; she continues on O2 by [name] at 3L/min...  ~  CXR 7/14 showed norm heart size, clear lungs, no edema etc..   HYPERTENSION (ICD-401.9) >>  ~  on ATENOLOL 50mg /d,  LASIX 40mg /d & K20/d...  ~  10/11:  BP=116/78 and she has mult somatic complaints but all are diminished compared to prev visits; denies bad HA's, CP, palpit, syncope, edema, etc... ~  4/12:  BP= 140/60 and she persists w/ mult somatic complaints... ~  10/12:  BP= 122/74 & she has mult somatic complaints; needs better low sodium, calorie restricted diet & wt reduction... ~  3/13:  BP= 120/72 & she is unchanged symptomatically;  EKG  3/13 showed NSR, rate68, LAD, late transition, NSSTTWA... ~  7/13:  BP= 110/62 & she persists w/ mult somatic complaints... ~  10/13:  BP= 118/60 & she remains clinically stable... ~  7/14: on Aten50, Lisinopril5, Lasix20-3/d, K10Qod; BP= 130/70 & she has mult somatic complaints but no specific CP, palpit, ch in SOB or edema; continue low sodium & wt reduction. ~  7/14: DrEllison stopped her Aten50; f/u BP on Lisin5, Lasix60, & K10Qod = 122/72...  CAD (ICD-414.00) - Hosp at [facility] 4/09 w/ CP... cath showed non-obstructive CAD... no CP, palpit, etc (but she is way too sedentary). ~  cath 4/09 by DrCooper showed normLMAIN, 20% midLAD, norm CIRC, 20-30% proxRCA, EF= 50-55%... ~  EKG 1/11 showed SBrady, rate54, ?septal infarct, NSSTTWA, NAD... ~  EKG 3/13 showed NSR, rate68, LAD, late transition, NSSTTWA... ~  CTAngio Chest 3/13 showed calcif coronaries as noted... ~  DrCooper 7/13> stable, no angina, too sedentary; f/u 2DEcho w/ mild LVH, norm LVF w/ EF=55-60% & norm wall motion, mils AS & AI, Gr2DD.Marland KitchenMarland Kitchen  PALPITATIONS (ICD-785.1) >>  DIASTOLIC CHF >>  ~  eval by DrKlein w/ 7 beats WT on monitor... he changed Lisinopril to BBlocker (currently ATENOLOL 50mg /d) and improved... ~  she saw DrCopper for Cards f/u 1/11- palpit well controlled on Atenolol; hx non-obstructive CAD & good LVF; no change in Rx. ~  [facility] 6/14 w/ Diastolic CHF and Lasix adjusted- Disch on Lasix20-3/d  AORTIC STENOSIS (ICD-424.1) - she has mild Ao Valve disease w/ AS/ AI... followed by DrCooper. ~  2DEcho 11/06 showed mild ca++ AoV w/ mild AS, norm LVF w/ EF=55-65% ~  repeat 2DEcho 1/09 showed similar mild ca++ AoV w/ mild AS, mild AI, no regional wall motion abn, norm LVf w/ EF= 60%... NOTE: est PAsys= 35... ~  repeat 2DEcho 1/10 showed norm LVF w/ EF= 55-60%, no regional wall motion abn, mild DD, mod calcif AoV & mild reduced leaflet excursion & mild AI... ~  Repeat 2DEcho 7/13 showed norm LVF w/ EF=55-65%, no regional wall  motion abn,  Gr2DD, mildAS, mildAI, mild LAdil... ~  DrCooper 7/13> stable, no angina, too sedentary; f/u 2DEcho w/ mild LVH, norm LVF w/ EF=55-60% & norm wall motion, mils AS & AI, Gr2DD... ~  7/14: known non-obstructive CAD; hx atypCP/ CWP; no angina & she remains way too sedentary; discussed risk factor reduction strategy & she saw DrCooper 5/14> stable, no angina, too sedentary; last 2DEcho 7/13 w/ mild LVH, norm LVF w/ EF=55-60% & norm wall motion, mild AS&AI, Gr2DD;  EKG 5/14 w/ NSR, rate62, 1st degree AVB, NSSTTWA...  HYPERCHOLESTEROLEMIA (ICD-272.0) - on SIMVASTATIN 40mg /d... ~  FLP 6/09 showed TChol 107, TG 69, HDL 35, LDL 59 ~  FLP 3/10 showed TChol 128, TG 125, HDL 33, LDL 70 ~  FLP 12/10 showed TChol 98, TG 64, HDL 37, LDL 48... continue same. ~  FLP 10/11 showed TChol 124, TG 105, HDL 37, LDL 66 ~  FLP 4/12 showed TChol 122, TG 95, HDL 37, LDL 66 ~  FLP 10/12 on Simva40 showed TChol 110, TG 116, HDL 34, LDL 53 ~  FLP 5/13 on Simva40 showed TChol 94, TG 130, HDL 32, LDL 36  DM (ICD-250.00) - hx irreg control, not really on diet... on LANTUS per drEllison,  & odd METFORMIN, GLIMEPIRIDE, JANUVIA...  ~  labs 10/08 BS=198, HgA1C=7.2 ~  labs 1/09 showed BS=119, HgA1c=6.0 ~  labs 6/09 showed BS= 248, HgA1c= 7.0..[name] discussed need for starting insulin if she can't get on track and get wt down... ~  labs 9/09 showed BS= 236, HgA1c= 7.6.Marland KitchenMarland Kitchen rec- start Lantus 5u/d... grad increased to 10u. ~  labs 11/09 showed BS= 166, HgA1c= 6.0.Marland KitchenMarland Kitchen rec- great! keep same. ~  labs 3/10 showed BS= 169, A1c= 6.8 ~  labs 12/10 showed BS= 149, A1c= 5.8, Umicroalb= neg. ~  labs 10/11 showed BS= 215, A1c= 7.1.[name]. rec> better diet, get wt down. ~  Labs 4/12 showed BS= 172, A1c=5.8 ~  Labs 10/12 showed BS= 245, A1c= 8.1.Marland KitchenMarland Kitchen rec to take meds every day; incr Lantus dose 10==> 20u/d... ~  Labs 3/13 on Lantus20+48meds showed BS= 276, A1c= 6.8 (improved)... ~  4/13:  No diabetic retinopathy per optometrist at [facility]... ~  Labs 5/13 on Lantus20+41meds showed BS= 191, A1c= 6.9 ==> subsequently BS incr & Lantus incr ~  Labs 7/13 on Lantus40+56meds showed BS= 339, A1c= 7.4.Marland KitchenMarland Kitchen ?why control is off, rec move shots around & incr by 5u/wk til BS<200. ~  Labs 10/13 on Lantus25+45meds showed BS= 164, A1c= 5.0.Marland KitchenMarland Kitchen Too tight & rec to decr Lantus20 & decr Glimep4mg  to 1/2 tab... ~  She has been adjusting her meds on her own in the interval => referred to DrEllison, Endocrinology... ~  7/14: on Lantus20u & off all DM pills per DrEllison; needs much better diet & exercise program! Not checking BS at home; labs from 6/14 showed BS=169, A1c=6.7 & she will continue f/u w/ Endocrinology. ~  7/14 f/u DrEllison & stopped all DM pills and increased her insulin to Lantus50Bid  MORBID OBESITY (ICD-278.01) - not really on diet or exercise program... ~  weight 6/09 = 223# ~  weight 11/09 = 228# ~  weight 3/10 = 225# ~  weight 6/10 = 217# ~  weight 9/10 = 213# ~  weight 12/10 = 224# ~  weight 2/11 = 216# ~  weight 10/11 = 230# ~  Weight 4/12 = 222# ~  Weight 10/12 = 225# ~  Weight 3/13 = 215#...  great job, keep it up... 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  ~  EGD  2/06 showed angiodysplasia & mult gastric pseudopolyps, watermelon stomach improved from 2003. ~  recent f/u EGD 9/09 showed chr gastritis, no watermelon stomach & improved from prev... ~  recurrent anemia 12/10 w/ f/u EGD 2/11 showing gastric antral avm's- s/p argon laser ablation, no varicies etc... ~  2011: she's been followed closely by GI DrDBrodie for her Autoimmune liver dis w/ Cirrhosis & splenomegaly, chr GI blood loss due to portal hypertensive  gastropathy, angiodysplasia, & colon polyps> off her prev Immuran Rx due to nausea. ~  5/13:  She is not on PPI and c/o some abd discomfort- rec starting PROTONIX 40mg /d... ~  She saw DrDBrodie 10/13 w/ c/o dysphagia + f/u of her autoimmune liver dis, portal HTN, cirrhosis, "watermelon stomach" & hx argon laser ablation of gastric AVMs (chr GI blood loss w/ periodic Fe infusions- last 8/13); EGD 10/13 showed norm esoph mucosa, no varicies, spasm at the LES but no stricture found; dilated to 30F; mod severe portal hypertensive gastropathy in stomach, no active bleeding; REC> continue PPI, try Levsin... ~  1/14:  She had EGD & Argon Laser ablation of gastric AVM by DrDBrodie... 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  DIVERTICULOSIS OF COLON (ICD-562.10) & POLYP, COLON (ICD-211.3) -  ~  last colonoscopy 10/08 w/ adenomatous polyp removed, and angiodysplasia without hemorrhage ~  CT Abd 10/08 showed Cirrhosis, borderline splenomeg, sl pancreatic atrophy & duct dil, sm umbil hernia, DJD sp, NAD... ~  10/13: she had f/u drDBrodie> last colonoscopy was 2008, she felt pt was too high risk for prep & sedation for a f/u colonoscopy & they decided to hold off...  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 ~  10/11:  presents w/ dysuria & UTI Rx w/ Cipro... ~  12/11:  She had f/u DrGrapey... ~   6/14: she was treated for a sens EColi UTI during 6/14 Hosp w/ Cipro...  OSTEOARTHRITIS (ICD-715.90)  FIBROMYALGIA (ICD-729.1) - she persists w/ mult somatic complaints...  ? of OSTEOPOROSIS (ICD-733.00) - she had normal BMD in 2001 at [place]  RESTLESS LEGS SYNDROME (ICD-333.94) - she prev weaned off the Requip & remains on SINEMET-CR 25-100Bid... ~  9/10: now c/o recurrent restless leg symptoms... restart REQUIP 2mg  & titrate up. ~  12/10: persistant symptoms on 2mg Qhs... rec to incr to 4mg ... ~  2/11: she reports resolution of RLS on 4mg  Requip Qhs, resting well now. ~  12/11:  Recurrent RLS symptoms  despite the Requip & seen by DrDohmeier w/ addition of Sinemet 25/100 Bid... ~  2012:  Stable on Requip4mg  & Sinemet 25/100 Bid... ~  7/13:  She is once again c/o incr RLS symptoms & rec to restart REQUIP 4mg /d...  ~  8/13: she had f/u visit w/ DrDohmeier> RLS, tremors, & insomnia; Seroquel helped for awhile, she is working w/ Community education officer to help her problems... ~  7/14: on Requip4Qhs & Sinemet25/100Bid from DrDohmeier (RLS, Trmeor, Gait abn) w/ fair control of symptoms- her note is reviewed & they stopped the Sinemet...  DYSTHYMIA (ICD-300.4) - Hx depression w/ psychotic features and hosp 2/09 at [facility] w/ delusions of pedunculosis> she was prev treated by Dr. Bartholomew Crews & DrSuttenfield for counselling...  Adm to [facility] in May09 by DrBarbSmith w/ depression and meds adjusted (see DC summary)...  Now followed by [name] and [name] at [facility]- she was re-admitted 10/09 and meds changed to RISPERDAL, PRISTIQUE, BUPROPION, SEROQUEL, XANAX & Nuvigil (Armodafinil)...  husb indicates that she is much better at present & continues outpt Rx... ~  MRI/ MRA Brain 2/08 showed sm vessel dis & atherosclerotic changes in post circ... ~  9/10: pt indicates that she is off the Trazodone... DrHejazi added Wellbutrin ?for her RLS? ~  2011:  pt continues regular f/u w/  Psychiatry on numerous meds w/ freq med adjustments; she is requested to get notes from [facility] to our attention & bring all med bottles to her OVs for review... ~  2012:  We have not received any notes from [facility]; pt has not complied w/ our request for med bottles to be brought to each OV... ~  2013:  We still don't have accurate list of her current meds as we have not received [facility] notes & she NEVER brings meds to office despite our requests... ~  7/14: on RX per Psychiatry DrHejazi (we don't have notes from him) & she does not know her meds, didn't bring med bottles or med list!  As best we can tell she is on Nuvigil (on hold), Pristiq100, WellbutrinXL150, Seroquel100-4Qhs, Xanax1mg  prn- all from Psychiatry & she is reminded to bring all bottles to every visit...   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 ~  labs 04/18/08 by DrBrodie showed Hg= 10.4, Fe= 52... she ordered 1,200mg  IV Iron- given 04/26/08. ~  labs 05/19/08 showed Hg= 11.3, Fe= 117... ~  labs 11/09 showed Hg= 12.0.Marland KitchenMarland Kitchen ~  labs 3/10 showed Hg= 12.3, Fe= 79 ~  labs 6/10 showed Hg= 11.5, Fe= 73 ~  labs 12/10 showed Hg= 7.2, Fe= 28 (6%sat)... given [facility] & IV Fe per DrBrodie in Jan2012. ~  labs 2/11 showed Hg= 11.8, Fe= 95... pt indicates that DrBrodie will be following labs going forward... ~  labs 6/11 showed Hg= 11.2 ~  labs 9/11 showed Hg= 11.1, Fe= 106 ~  Labs 4/12 showed Hg=11.0, Fe=118 ~  Labs 10/12 showed Hg=9.5, Fe=35 (7%sat)... We will arrange for outpt infusion 1000mg  Fe... ~  Labs 12/12 showed Hg=11.4, Fe=92 (26%sat) ~  Labs 3/13 showed Hg=11.2, Fe=71 (17%sat) ~  Labs 5/13 showed Hg=9.8, Fe=53 (13%sat) ~  04/22/12:  She was given 1000mg  IV Iron Dextran by DrDBrodie... ~  Labs 10/13 showed Hg=11.1, Fe=76  (23%) ~  Labs 1/14 showed Hg=7.0 (down 4pts) Hct=21.9 Fe=51 (  11%sat) B12=623, Protime=11.6/ 1.1, AFP=pending; Abd sonar per DrDBrodie- cirrhosis, no ascites. ~  Given IV Iron by DrDBrodie w/ Hg improved to 9.3 & Fe up to 177... ~  Subsequently her Hg settled back to 8.8 & Iron dropped to 70... The pt did not want me to recheck her labs- "DrBrodie does it" ~  [facility] 6/14 w/ further GIB and had EGD, Capsule Endo, etc- see above...  We are back to checking CBC, Fe monthly to check for needed iron infusions... ~  Labs 7/14 showed Hg= 8.8 and Fe= 58 (14%) so we decided to proceed w/ IV Fe Feraheme 510mg  x2 infusions... ~  Labs 8/14 s/p IV Feraheme x2 showed Hg= 8.8, Fe= 138 (35%sat); Rec to recheck labs Qmo...   Past Surgical History  Procedure Laterality Date  . Hemorroidectomy    . Appendectomy    . Vaginal hysterectomy    . Breast biopsy      bilateral  . Cesarean section      x 3  . Appendectomy    . Esophagogastroduodenoscopy  06/12/2012    Procedure: ESOPHAGOGASTRODUODENOSCOPY (EGD);  Surgeon: Hart Carwin, MD;  Location: [facility] ENDOSCOPY;  Service: Endoscopy;  Laterality: N/A;  . Balloon dilation  06/12/2012    Procedure: BALLOON DILATION;  Surgeon: Hart Carwin, MD;  Location: [facility] ENDOSCOPY;  Service: Endoscopy;  Laterality: N/A;  ?balloon  . Esophagogastroduodenoscopy  09/10/2012    Procedure: ESOPHAGOGASTRODUODENOSCOPY (EGD);  Surgeon: Hart Carwin, MD;  Location: [facility] ENDOSCOPY;  Service: Endoscopy;  Laterality: N/A;  . Hot hemostasis  09/10/2012    Procedure: HOT HEMOSTASIS (ARGON PLASMA COAGULATION/BICAP);  Surgeon: Hart Carwin, MD;  Location: [facility] ENDOSCOPY;  Service: Endoscopy;  Laterality: N/A;  . Esophagogastroduodenoscopy N/A 01/18/2013    Procedure: ESOPHAGOGASTRODUODENOSCOPY (EGD);  Surgeon: Hart Carwin, MD;  Location[facility] ENDOSCOPY;  Service: Endoscopy;  Laterality: N/A;  . Esophagogastroduodenoscopy N/A 05/24/2013    Procedure: ESOPHAGOGASTRODUODENOSCOPY (EGD);  Surgeon: Beverley Fiedler, MD;  Location: [facility] ENDOSCOPY;  Service: Gastroenterology;  Laterality: N/A;  . Hot hemostasis N/A 05/24/2013    Procedure: HOT HEMOSTASIS (ARGON PLASMA COAGULATION/BICAP);  Surgeon: Beverley Fiedler, MD;  Location: [facility] ENDOSCOPY;  Service: Gastroenterology;  Laterality: N/A;    Outpatient Encounter Prescriptions as of 06/25/2013  Medication Sig  . albuterol (PROAIR HFA) 108 (90 BASE) MCG/ACT inhaler Inhale 2 puffs into the lungs every 6 (six) hours as needed for wheezing or shortness of breath.   . ALPRAZolam (XANAX) 1 MG tablet Take 1 mg by mouth 3 (three) times daily as needed for anxiety.   . AMBULATORY NON FORMULARY MEDICATION Continuous O2 @@ 3LMP  . b complex vitamins tablet Take 1 tablet by mouth daily.   . B-D ULTRAFINE III SHORT PEN 31G X 8 MM MISC USE AS DIRECTED  . buPROPion (WELLBUTRIN XL) 150 MG 24 hr tablet Take 150 mg by mouth daily.  [name] desvenlafaxine (PRISTIQ) 100 MG 24 hr tablet Take 150 mg by mouth daily.  [name] dextromethorphan (DELSYM) 30 MG/5ML liquid Take 60 mg by mouth as needed for cough.  . Fluticasone-Salmeterol (ADVAIR DISKUS) 250-50 MCG/DOSE AEPB Inhale 1 puff into the lungs 2 (two) times daily.  [name] FREESTYLE LITE test strip TEST BLOOD SUGAR 3 TIMES DAILY  . furosemide (LASIX) 20 MG tablet Take 40 mg by mouth 2 (two) times daily.   . Insulin Glargine (LANTUS SOLOSTAR ) Inject 200 Units into the skin daily before supper.  [name] lisinopril (PRINIVIL,ZESTRIL) 5 MG tablet Take 5 mg  by mouth at bedtime.   . Multiple Vitamins-Minerals (CENTRUM SILVER PO) Take by mouth daily.  . nadolol (CORGARD) 20 MG tablet Take 1 tablet (20 mg total) by mouth daily.  . pantoprazole (PROTONIX) 40 MG tablet Take 1 tablet (40 mg total) by mouth 2 (two) times daily.  . polyethylene glycol (MIRALAX / GLYCOLAX) packet Take 17 g by mouth daily as needed (constipation).  . potassium chloride SA (K-DUR,KLOR-CON) 10 MEQ tablet Take 1 tablet (10 mEq total) by mouth every other day.  . promethazine  (PHENERGAN) 25 MG tablet Take 1 tablet (25 mg total) by mouth 2 (two) times daily as needed for nausea.  [name] QUEtiapine (SEROQUEL) 100 MG tablet Take 400 mg by mouth at bedtime.   [name] rOPINIRole (REQUIP) 4 MG tablet Take 4 mg by mouth at bedtime.  . Saline (OCEAN NASAL SPRAY NA) Place 1 spray into the nose daily as needed (congestion).  . Simethicone (GAS-X PO) Take 1 tablet by mouth daily as needed (flatulence).   . simvastatin (ZOCOR) 40 MG tablet Take 40 mg by mouth at bedtime.  . sucralfate (CARAFATE) 1 GM/10ML suspension Take 10 mL (1 g total) by mouth 2 (two) times daily.  . Vitamin D, Ergocalciferol, (DRISDOL) 50000 UNITS CAPS capsule Take 50,000 Units by mouth every 7 (seven) days. On fridays  . desoximetasone (TOPICORT) 0.05 % cream Apply topically 2 (two) times daily.  [name] levofloxacin (LEVAQUIN) 500 MG tablet Take 1 tablet (500 mg total) by mouth every morning.    Allergies  Allergen Reactions  . Aspirin Other (See Comments)    Causes nosebleeds  . Penicillins Itching  . Theophylline Nausea And Vomiting    Current Medications, Allergies, Past Medical History, Past Surgical History, Family History, and Social History were reviewed in [employer] record.    Review of Systems         See HPI - all other systems neg except as noted...      The patient complains of weight gain, dyspnea on exertion, muscle weakness, difficulty walking, and depression.  The patient denies anorexia, fever, weight loss, vision loss, decreased hearing, hoarseness, chest pain, syncope, peripheral edema, prolonged cough, headaches, hemoptysis, abdominal pain, melena, hematochezia, severe indigestion/heartburn, hematuria, incontinence, suspicious skin lesions, transient blindness, unusual weight change, abnormal bleeding, enlarged lymph nodes, and angioedema.     Objective:   Physical Exam     WD, Obese, 72 y/o WF in NAD... she is chr ill appearing... GENERAL:  Alert & oriented x  3... HEENT:  [place]/AT, pale conjuct, EOM-wnl, PERRLA, EACs-clear, TMs-wnl, NOSE-clear, THROAT-clear w/ dry MMs... NECK:  Supple w/ fairROM; no JVD; normal carotid impulses w/o bruits; no thyromegaly or nodules palpated; no lymphadenopathy. CHEST:  Clear to P & A; without wheezes/ rales/ or rhonchi heard... HEART:  Regular Rhythm;  gr 1/6 SEM without rubs or gallops... ABDOMEN:  Obese, soft & nontender w/ panniculus; normal bowel sounds; no organomegaly or masses detected. EXT: without deformities, mod arthritic changes; no varicose veins/ +venous insuffic/ tr edema. NEURO:  CN's intact;  no focal neuro deficits... DERM: along arms, torso and upper thighs several  Scattered circular large plaques , light red in color, no central clearing or blanching, minimal scaling border.  No petechaie noted  No adenopathy noted.   RADIOLOGY DATA:  Reviewed in the EPIC EMR & discussed w/ the patient...  LABORATORY DATA:  Reviewed in the EPIC EMR & discussed w/ the patient...   Assessment & Plan:

## 2013-06-29 NOTE — Assessment & Plan Note (Signed)
Rash ? Etiology  ? Large  plaque parapsoriasis Advised if not improving will need referral to Dermatology   Plan  May apply Topicort Twice daily  To rash spots.  IF not improving in next couple of weeks will need referral to dermatology  Follow up Dr. Kriste Basque  As planned and As needed

## 2013-06-29 NOTE — Progress Notes (Signed)
[name] 05-12-1941 MRN [id number]   History of Present Illness:  This is a 72 year old white female with advanced liver disease likely from autoimmune hepatitis or steatohepatitis. She has portal hypertension and portal hypertensive gastropathy resulting in GI blood loss. She has had multiple endoscopies and ablations of the AVMs and has been diagnosed with GAVE syndrome but the biopsies are very nonspecific and do not support a definitive diagnosis of vascular ectasia. She was hospitalized from 05/22/2013-05/27/2013 for anemia and received blood transfusions as well as an iron infusion. She had an ablation of the angiodysplasia by Dr Rhea Belton.. She has no specific complaints today other than fatigue. She also has had a nonpruritic erythematous rash involving her lower extremities, trunk and arms. It is not clear as to the etiology. She has been applying topical steroids for presumed psoriasis.   Past Medical History  Diagnosis Date  . Chronic airway obstruction, not elsewhere classified   . Unspecified chronic bronchitis   . Unspecified essential hypertension   . Coronary atherosclerosis of unspecified type of vessel, native or graft   . Palpitations   . Aortic valve disorders   . Pure hypercholesterolemia   . Morbid obesity   . Esophageal reflux   . Angiodysplasia of intestine (without mention of hemorrhage)   . Diverticulosis of colon (without mention of hemorrhage)   . Benign neoplasm of colon   . Autoimmune hepatitis   . Cystitis, unspecified   . Osteoarthrosis, unspecified whether generalized or localized, unspecified site   . Osteoporosis, unspecified   . Restless legs syndrome (RLS)   . Major depressive disorder, recurrent episode, severe, specified as with psychotic behavior   . Iron deficiency anemia secondary to blood loss (chronic)   . Coarse tremors     "just in my arms"  . Carpal tunnel syndrome on both sides   . Dysrhythmia     palpitations  . CHF (congestive  heart failure)   . Asthma   . Shortness of breath     "any time really"  . Sleep apnea     "but I don't use the equipment"  . Type II diabetes mellitus   . Blood transfusion   . H/O hiatal hernia   . 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 Adenomatous colon polyp   . Anginal pain   . Anxiety   . Pneumonia   . Heart murmur   . AVM (arteriovenous malformation)   . Portal hypertensive gastropathy   . Falls frequently   . UTI (lower urinary tract infection)   . Hiatal hernia   . GAVE (gastric antral vascular ectasia)    Past Surgical History  Procedure Laterality Date  . Hemorroidectomy    . Appendectomy    . Vaginal hysterectomy    . Breast biopsy      bilateral  . Cesarean section      x 3  . Appendectomy    . Esophagogastroduodenoscopy  06/12/2012    Procedure: ESOPHAGOGASTRODUODENOSCOPY (EGD);  Surgeon: Hart Carwin, MD;  Location: [facility] ENDOSCOPY;  Service: Endoscopy;  Laterality: N/A;  . Balloon dilation  06/12/2012    Procedure: BALLOON DILATION;  Surgeon: Hart Carwin, MD;  Location: [facility] ENDOSCOPY;  Service: Endoscopy;  Laterality: N/A;  ?balloon  . Esophagogastroduodenoscopy  09/10/2012    Procedure: ESOPHAGOGASTRODUODENOSCOPY (EGD);  Surgeon: Hart Carwin, MD;  Location: [facility] ENDOSCOPY;  Service: Endoscopy;  Laterality: N/A;  . Hot hemostasis  09/10/2012    Procedure: HOT HEMOSTASIS (ARGON  PLASMA COAGULATION/BICAP);  Surgeon: Hart Carwin, MD;  Location: [facility] ENDOSCOPY;  Service: Endoscopy;  Laterality: N/A;  . Esophagogastroduodenoscopy N/A 01/18/2013    Procedure: ESOPHAGOGASTRODUODENOSCOPY (EGD);  Surgeon: Hart Carwin, MD;  Location: [facility] ENDOSCOPY;  Service: Endoscopy;  Laterality: N/A;  . Esophagogastroduodenoscopy N/A 05/24/2013    Procedure: ESOPHAGOGASTRODUODENOSCOPY (EGD);  Surgeon: Beverley Fiedler, MD;  Location: [facility] ENDOSCOPY;  Service: Gastroenterology;  Laterality: N/A;  . Hot hemostasis N/A 05/24/2013    Procedure: HOT HEMOSTASIS (ARGON PLASMA COAGULATION/BICAP);   Surgeon: Beverley Fiedler, MD;  Location: [facility] ENDOSCOPY;  Service: Gastroenterology;  Laterality: N/A;    reports that she quit smoking about 20 years ago. Her smoking use included Cigarettes. She has a 37.5 pack-year smoking history. She has never used smokeless tobacco. She reports that she does not drink alcohol or use illicit drugs. family history includes Breast cancer in her maternal aunt and sister; Cervical cancer in her mother; Diabetes in her father, mother, and sister; Heart disease in her mother; Kidney disease in her mother; Multiple sclerosis in her other and sister. There is no history of Colon cancer. Allergies  Allergen Reactions  . Aspirin Other (See Comments)    Causes nosebleeds  . Penicillins Itching  . Theophylline Nausea And Vomiting        Review of Systems: Complaints of shortness of breath on exertion  The remainder of the 10 point ROS is negative except as outlined in H&P   Physical Exam: General appearance  Well developed, in no distress. Chronically ill-appearing Eyes- non icteric. HEENT nontraumatic, normocephalic. Mouth no lesions, tongue papillated, no cheilosis. Neck supple without adenopathy, thyroid not enlarged, no carotid bruits, no JVD. Lungs Clear to auscultation bilaterally. Nasal oxygen Cor normal S1, normal S2, regular rhythm, no murmur,  quiet precordium. Abdomen: Obese protuberant and soft without fluid wave. Increased venous pattern. Liver at costal margin. Normoactive bowel sounds. Rectal: Not done. Extremities trace  pedal edema. Skin multiple circular flat lesions which do not blanch with the pressure. The are not scaly; they are not pruritic or painful such as a vasculitis. They don't have any spider like appearance. They could possibly represent angiodysplasia or possible tinea infection. Neurological alert and oriented x 3. Psychological normal mood and affect. Appears depressed.  Assessment and Plan:  Problem #58 72 year old white  female with autoimmune liver disease and cirrhosis and portal hypertension. She is stable.   Problem #2 Portal hypertensive gastropathy with chronic intermittent GI blood loss. She is status post ablation of the AVMs. Biopsies do not support diagnosis of GAVE. She is to continue Protonix 40 mg twice a day. She is to discontinue Carafate slurry.  Problem #3 Morbid obesity.  Problem #4 Iron deficiency anemia. Patient is status post iron infusion. We will check her CBC and iron levels today.   06/29/2013 [name]

## 2013-06-30 ENCOUNTER — Other Ambulatory Visit: Payer: Self-pay | Admitting: *Deleted

## 2013-06-30 ENCOUNTER — Other Ambulatory Visit: Payer: Self-pay

## 2013-06-30 DIAGNOSIS — D649 Anemia, unspecified: Secondary | ICD-10-CM

## 2013-06-30 MED ORDER — NADOLOL 20 MG PO TABS: 20.0000 mg | ORAL_TABLET | Freq: Every day | ORAL | Status: DC

## 2013-07-01 ENCOUNTER — Other Ambulatory Visit: Payer: Self-pay | Admitting: Pulmonary Disease

## 2013-07-01 ENCOUNTER — Encounter: Payer: Self-pay | Admitting: Pulmonary Disease

## 2013-07-01 ENCOUNTER — Ambulatory Visit (INDEPENDENT_AMBULATORY_CARE_PROVIDER_SITE_OTHER): Payer: Medicare Other | Admitting: Pulmonary Disease

## 2013-07-01 ENCOUNTER — Telehealth: Payer: Self-pay | Admitting: *Deleted

## 2013-07-01 ENCOUNTER — Ambulatory Visit (INDEPENDENT_AMBULATORY_CARE_PROVIDER_SITE_OTHER)
Admission: RE | Admit: 2013-07-01 | Discharge: 2013-07-01 | Disposition: A | Discharge status: Home / Self Care | Payer: Medicare Other | Source: Ambulatory Visit | Attending: Pulmonary Disease | Admitting: Pulmonary Disease

## 2013-07-01 VITALS — BP 140/72 | HR 57 | Temp 99.1°F | Ht 64.0 in | Wt 222.2 lb

## 2013-07-01 DIAGNOSIS — I10 Essential (primary) hypertension: Secondary | ICD-10-CM

## 2013-07-01 DIAGNOSIS — R531 Weakness: Secondary | ICD-10-CM

## 2013-07-01 DIAGNOSIS — D509 Iron deficiency anemia, unspecified: Secondary | ICD-10-CM

## 2013-07-01 DIAGNOSIS — I2510 Atherosclerotic heart disease of native coronary artery without angina pectoris: Secondary | ICD-10-CM

## 2013-07-01 DIAGNOSIS — E7800 Pure hypercholesterolemia, unspecified: Secondary | ICD-10-CM

## 2013-07-01 DIAGNOSIS — F333 Major depressive disorder, recurrent, severe with psychotic symptoms: Secondary | ICD-10-CM

## 2013-07-01 DIAGNOSIS — R0609 Other forms of dyspnea: Secondary | ICD-10-CM

## 2013-07-01 DIAGNOSIS — R5381 Other malaise: Secondary | ICD-10-CM

## 2013-07-01 DIAGNOSIS — E119 Type 2 diabetes mellitus without complications: Secondary | ICD-10-CM

## 2013-07-01 DIAGNOSIS — R0600 Dyspnea, unspecified: Secondary | ICD-10-CM

## 2013-07-01 DIAGNOSIS — K219 Gastro-esophageal reflux disease without esophagitis: Secondary | ICD-10-CM

## 2013-07-01 DIAGNOSIS — K31819 Angiodysplasia of stomach and duodenum without bleeding: Secondary | ICD-10-CM

## 2013-07-01 DIAGNOSIS — I359 Nonrheumatic aortic valve disorder, unspecified: Secondary | ICD-10-CM

## 2013-07-01 MED ORDER — TRAMADOL HCL 50 MG PO TABS: 50.0000 mg | ORAL_TABLET | Freq: Three times a day (TID) | ORAL | Status: DC | PRN

## 2013-07-01 MED ORDER — LIDOCAINE 5 % EX PTCH: 1.0000 | MEDICATED_PATCH | CUTANEOUS | Status: DC

## 2013-07-01 NOTE — Telephone Encounter (Signed)
Pt called stating her sugars have been dropping while on 200 units of Lantus. She lowered her dose of Lantus to 160 units last night and her sugar was 96 when she woke up. Please advise.

## 2013-07-01 NOTE — Telephone Encounter (Signed)
Please continue the 160. Ret as scheduled

## 2013-07-01 NOTE — Patient Instructions (Signed)
Today we updated your med list in our EPIC system...    Continue your current medications the same...  Today we did a follow up CXR & right rib details to r/o fracture...    We will contact you w/ the results when available...   Try the Lidoderm patch- one each AM & off each eve...  Mark your calendar for follow up blood ct & iron level in 1st wk of dec...    We will see you back in office 1st wk of Jan...  Call for any questions.Marland KitchenMarland Kitchen

## 2013-07-02 NOTE — Telephone Encounter (Signed)
Called pt and lvm advising her to continue the Lantus at the lower dose of 160 units and to return as scheduled on Nov 20th at 10:00 am.

## 2013-07-03 ENCOUNTER — Other Ambulatory Visit: Payer: Self-pay | Admitting: Adult Health

## 2013-07-06 ENCOUNTER — Telehealth: Payer: Self-pay | Admitting: Pulmonary Disease

## 2013-07-06 MED ORDER — FLUTICASONE-SALMETEROL 250-50 MCG/DOSE IN AEPB: INHALATION_SPRAY | RESPIRATORY_TRACT | Status: DC

## 2013-07-06 NOTE — Telephone Encounter (Signed)
Spouse aware sample leftt upfront for p/u.

## 2013-07-15 ENCOUNTER — Ambulatory Visit (INDEPENDENT_AMBULATORY_CARE_PROVIDER_SITE_OTHER): Payer: Medicare Other | Admitting: Endocrinology

## 2013-07-15 ENCOUNTER — Encounter: Payer: Self-pay | Admitting: Endocrinology

## 2013-07-15 VITALS — BP 140/98 | HR 52 | Temp 98.1°F | Resp 16

## 2013-07-15 DIAGNOSIS — E1049 Type 1 diabetes mellitus with other diabetic neurological complication: Secondary | ICD-10-CM

## 2013-07-15 MED ORDER — INSULIN LISPRO 100 UNIT/ML (KWIKPEN): 10.0000 [IU] | PEN_INJECTOR | Freq: Every day | SUBCUTANEOUS | Status: DC

## 2013-07-15 NOTE — Patient Instructions (Addendum)
check your blood sugar twice a day.  vary the time of day when you check, between before the 3 meals, and at bedtime.  also check if you have symptoms of your blood sugar being too high or too low.  please keep a record of the readings and bring it to your next appointment here.  please call us sooner if your blood sugar goes below 70, or if you have a lot of readings over 200.  Please reduce the lantus to 150 units each evening, and: Add humalog, 10 units with the evening meal. Please come back for a follow-up appointment in January.

## 2013-07-15 NOTE — Progress Notes (Signed)
Subjective:    Patient ID: [name], female    DOB: June 19, 1941, 72 y.o.   MRN: [id number]  HPI pt returns for f/u of insulin-requiring DM (dx'ed 2004; she has moderate neuropathy of the lower extremities, and associated CAD; the interpretation of a1c's was limited by blood transfusions in early 2014; she needs a simple insulin regimen).  she brings a record of her cbg's which i have reviewed today.  It varies from 90 (afternoon) to 250 (hs).  Due to mild hypoglycemia in the afternoon, it was decreased to 160 units qam.  Since then, Also, she now takes lantus in the evening, due to the cbg pattern.  She eats a very light lunch.   Past Medical History  Diagnosis Date  . Chronic airway obstruction, not elsewhere classified   . Unspecified chronic bronchitis   . Unspecified essential hypertension   . Coronary atherosclerosis of unspecified type of vessel, native or graft   . Palpitations   . Aortic valve disorders   . Pure hypercholesterolemia   . Morbid obesity   . Esophageal reflux   . Angiodysplasia of intestine (without mention of hemorrhage)   . Diverticulosis of colon (without mention of hemorrhage)   . Benign neoplasm of colon   . Autoimmune hepatitis   . Cystitis, unspecified   . Osteoarthrosis, unspecified whether generalized or localized, unspecified site   . Osteoporosis, unspecified   . Restless legs syndrome (RLS)   . Major depressive disorder, recurrent episode, severe, specified as with psychotic behavior   . Iron deficiency anemia secondary to blood loss (chronic)   . Coarse tremors     "just in my arms"  . Carpal tunnel syndrome on both sides   . Dysrhythmia     palpitations  . CHF (congestive heart failure)   . Asthma   . Shortness of breath     "any time really"  . Sleep apnea     "but I don't use the equipment"  . Type II diabetes mellitus   . Blood transfusion   . H/O hiatal hernia   . 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 Adenomatous colon polyp   .  Anginal pain   . Anxiety   . Pneumonia   . Heart murmur   . AVM (arteriovenous malformation)   . Portal hypertensive gastropathy   . Falls frequently   . UTI (lower urinary tract infection)   . Hiatal hernia   . GAVE (gastric antral vascular ectasia)     Past Surgical History  Procedure Laterality Date  . Hemorroidectomy    . Appendectomy    . Vaginal hysterectomy    . Breast biopsy      bilateral  . Cesarean section      x 3  . Appendectomy    . Esophagogastroduodenoscopy  06/12/2012    Procedure: ESOPHAGOGASTRODUODENOSCOPY (EGD);  Surgeon: Hart Carwin, MD;  Location: [facility];  Service: Endoscopy;  Laterality: N/A;  . Balloon dilation  06/12/2012    Procedure: BALLOON DILATION;  Surgeon: Hart Carwin, MD;  Location: [facility] ENDOSCOPY;  Service: Endoscopy;  Laterality: N/A;  ?balloon  . Esophagogastroduodenoscopy  09/10/2012    Procedure: ESOPHAGOGASTRODUODENOSCOPY (EGD);  Surgeon: Hart Carwin, MD;  Location: [facility];  Service: Endoscopy;  Laterality: N/A;  . Hot hemostasis  09/10/2012    Procedure: HOT HEMOSTASIS (ARGON PLASMA COAGULATION/BICAP);  Surgeon: Hart Carwin, MD;  Location: [facility];  Service: Endoscopy;  Laterality: N/A;  . Esophagogastroduodenoscopy  N/A 01/18/2013    Procedure: ESOPHAGOGASTRODUODENOSCOPY (EGD);  Surgeon: Hart Carwin, MD;  Location: [facility] ENDOSCOPY;  Service: Endoscopy;  Laterality: N/A;  . Esophagogastroduodenoscopy N/A 05/24/2013    Procedure: ESOPHAGOGASTRODUODENOSCOPY (EGD);  Surgeon: Beverley Fiedler, MD;  Location: [facility] ENDOSCOPY;  Service: Gastroenterology;  Laterality: N/A;  . Hot hemostasis N/A 05/24/2013    Procedure: HOT HEMOSTASIS (ARGON PLASMA COAGULATION/BICAP);  Surgeon: Beverley Fiedler, MD;  Location: [facility] ENDOSCOPY;  Service: Gastroenterology;  Laterality: N/A;    History   Social History  . Marital Status: Married    Spouse Name: jerry Zahradnik    Number of Children: 4  . Years of Education: 12   Occupational History  .  Disabled   .     Social History Main Topics  . Smoking status: Former Smoker -- 1.50 packs/day for 25 years    Types: Cigarettes    Quit date: 08/26/1992  . Smokeless tobacco: Never Used  . Alcohol Use: No     Comment: 11/22/11 "last alcoholic drink was years ago"  . Drug Use: No  . Sexual Activity: No   Other Topics Concern  . Not on file   Social History Narrative   Patient lives at home with her husband [name]).    Retired.   Caffeine- None    Right handed.    Current Outpatient Prescriptions on File Prior to Visit  Medication Sig Dispense Refill  . albuterol (PROAIR HFA) 108 (90 BASE) MCG/ACT inhaler Inhale 2 puffs into the lungs every 6 (six) hours as needed for wheezing or shortness of breath.       . ALPRAZolam (XANAX) 1 MG tablet Take 1 mg by mouth 3 (three) times daily as needed for anxiety.       . AMBULATORY NON FORMULARY MEDICATION Continuous O2 @@ 3LMP      . b complex vitamins tablet Take 1 tablet by mouth daily.       . B-D ULTRAFINE III SHORT PEN 31G X 8 MM MISC USE AS DIRECTED  100 each  3  . buPROPion (WELLBUTRIN XL) 150 MG 24 hr tablet Take 150 mg by mouth daily.      [name] desvenlafaxine (PRISTIQ) 100 MG 24 hr tablet Take 150 mg by mouth daily.      [name] dextromethorphan (DELSYM) 30 MG/5ML liquid Take 60 mg by mouth as needed for cough.      . Fluticasone-Salmeterol (ADVAIR DISKUS) 250-50 MCG/DOSE AEPB INHALE 1 PUFF INTO THE LUNGS 2 (TWO) TIMES DAILY.  28 each  0  . FREESTYLE LITE test strip TEST BLOOD SUGAR 3 TIMES DAILY  50 each  6  . furosemide (LASIX) 20 MG tablet Take 40 mg by mouth 2 (two) times daily.       . Insulin Glargine (LANTUS SOLOSTAR [place]) Inject 150 Units into the skin daily before supper.       . lidocaine (LIDODERM) 5 % Place 1 patch onto the skin daily. Remove & Discard patch within 12 hours or as directed by MD  30 patch  5  . lisinopril (PRINIVIL,ZESTRIL) 5 MG tablet Take 5 mg by mouth at bedtime.       . Multiple Vitamins-Minerals (CENTRUM  SILVER PO) Take by mouth daily.      . nadolol (CORGARD) 20 MG tablet Take 1 tablet (20 mg total) by mouth daily.  30 tablet  5  . pantoprazole (PROTONIX) 40 MG tablet Take 1 tablet (40 mg total) by mouth 2 (two) times daily.  60 tablet  11  . polyethylene glycol (MIRALAX / GLYCOLAX) packet Take 17 g by mouth daily as needed (constipation).      . potassium chloride SA (K-DUR,KLOR-CON) 10 MEQ tablet Take 1 tablet (10 mEq total) by mouth every other day.  30 tablet  1  . promethazine (PHENERGAN) 25 MG tablet Take 1 tablet (25 mg total) by mouth 2 (two) times daily as needed for nausea.  30 tablet  5  . QUEtiapine (SEROQUEL) 100 MG tablet Take 400 mg by mouth at bedtime.       [name] rOPINIRole (REQUIP) 4 MG tablet Take 4 mg by mouth at bedtime.      . Saline (OCEAN NASAL SPRAY NA) Place 1 spray into the nose daily as needed (congestion).      . Simethicone (GAS-X PO) Take 1 tablet by mouth daily as needed (flatulence).       . simvastatin (ZOCOR) 40 MG tablet Take 40 mg by mouth at bedtime.      . sucralfate (CARAFATE) 1 GM/10ML suspension Take 10 mL (1 g total) by mouth 2 (two) times daily.  420 mL  1  . traMADol (ULTRAM) 50 MG tablet Take 1 tablet (50 mg total) by mouth 3 (three) times daily as needed.  90 tablet  5  . Vitamin D, Ergocalciferol, (DRISDOL) 50000 UNITS CAPS capsule Take 50,000 Units by mouth every 7 (seven) days. On fridays       No current facility-administered medications on file prior to visit.    Allergies  Allergen Reactions  . Aspirin Other (See Comments)    Causes nosebleeds  . Penicillins Itching  . Theophylline Nausea And Vomiting    Family History  Problem Relation Age of Onset  . Heart disease Mother   . Cervical cancer Mother   . Kidney disease Mother   . Diabetes Mother   . Breast cancer Sister   . Multiple sclerosis Sister   . Colon cancer Neg Hx   . Breast cancer Maternal Aunt     x 2  . Diabetes Father   . Diabetes Sister   . Multiple sclerosis Other      BP 140/98  Pulse 52  Temp(Src) 98.1 F (36.7 C) (Oral)  Resp 16  SpO2 99%  Review of Systems Denies LOC and weight change.      Objective:   Physical Exam VITAL SIGNS:  See vs page GENERAL: no distress.   Lab Results  Component Value Date   HGBA1C 8.0* 05/23/2013      Assessment & Plan:  DM: improved, but she needs increased rx.  This insulin regimen was chosen from multiple options, for its simplicity.  The benefits of glycemic control must be weighed against the risks of hypoglycemia.  Based on the pattern of her cbg's, she needs some adjustment in her therapy. She is willing to take 1 injection of fast-acting insulin, in addition to lantus. CAD: in this setting, pt should avoid hypoglycemia.  COPD: this limits exercise rx of DM.

## 2013-07-18 ENCOUNTER — Encounter: Payer: Self-pay | Admitting: Pulmonary Disease

## 2013-07-18 NOTE — Progress Notes (Signed)
Subjective:    Patient ID: [name], female    DOB: Sep 27, 1940, 72 y.o.   MRN: [id number]  HPI 72 y/o WF here for a follow up visit... she has mult med problems as noted below>   ~  September 01, 2012:  532mo ROV & [name] is c/o a URI "cold in my chest" w/ cough, discolored phlegm "diff colors at diff times", chest congestion, etc; she notes- tired all the time, HH is bothering her, BS at home betw 170-200 range... We reviewed the following medical problems during today's office visit>>     COPD> on Home O2, Advair250, NEBS, Mucinex; remote smoker quit yrs ago; known obstructive & restrictive lung dis; she had ZPak recently but persists w/ c/o discolored phlegm, no f/c/s, vague chest discomfort; last CXR 3/13 showed linear scarring left mid lung, otherw clear; CTA 3/13 was neg for PE & showed some atelec, coronary calcif, multilevel spondylosis, & cirrhosis of the liver; she declined f/u film today- we decided to treat w/ Levaquin, Mucinex, Fluids, Hycodan...    HBP> on Aten50, Lasix40, K20; BP= 118/82 & she has mult somatic complaints but no specific CP, palpit, ch in SOB or edema; continue low sodium & wt reduction.     CAD & mild AS/AI> known non-obstructive CAD; hx atypCP/ CWP; no angina & she remains way too sedentary; discussed risk factor reduction strategy & she saw DrCooper 7/13> stable, no angina, too sedentary; f/u 2DEcho w/ mild LVH, norm LVF w/ EF=55-60% & norm wall motion, mils AS & AI, Gr2DD...    Chol> on Simva40; not really on diet & we discussed this; last FLP 5/13 showed TChol 94, TG 130, HDL 32, LDL 36; continue same, needs exercise!    DM> on Lantus25, Metform1000Bid, Glimep2, NWGNFAO130; needs much better diet & exercise program! Not checking BS at home; labs from 10/13 showed BS=164, A1c=5.0 so we decreased the Glimep from 4mg  to 2mg  each AM...    Obesity> way too sedentary, ?on diet & her wt is up to 209# today; we discussed diet, exercise, wt reduction strategies...    <<  Chronic GI problems followed by DrDBrodie >>    {GERD, Angiodysplasia w/ chronic GI blood loss >> see below... 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 We will continue to check labs every 70mo w/ Iron infusions of Hg is reduced & ROV in 70mo...    DJD/ FM/ ?osteopenia/ VitD defic> she has mult somatic complaints & takes Tylenol prn; on VitD 50K weekly...    RLS> on Requip4Qhs & Sinemet25/100Bid from DrDohmeier w/ fair control of symptoms- her note is reviewed...    Anxiety/ Depression/ Psyche> on RX per Psychiatry DrHejazi (we don't have notes from him) & she does not know her meds, didn't bring med bottles or med list!  As best we can tell she is on Nuvigil, Pristiq, Wellbutrin, Risperdal, Seroquel, Xanax> all from Psychiatry & she is reminded to bring all bottles to every visit... We reviewed prob list, meds, xrays and labs> see below for updates >>  LABS 1/14:  CBC- Hg=7.0 (down 4pts) Hct=21.9 Fe=51 (11%sat) B12=623, Protime=11.6/ 1.1, AFP=pending; Abd sonar per DrDBrodie- pending...  ~  Dec 30, 2012:  70mo ROV &  [name] is c/o incr SOB, having to wear her O2 all the time, and can't get a deep breath; she feels weak, states she's having a lot of nausea (wants phenergan refilled); she has had several interval visits w/ [facility] & DrDBrodie for f/u of her Cirrhosis (no ascites on sonar 1/14), watermelon stomach, GAVE syndrome, & chr blood loss anemia; on Protonix40 & had EGD w/ Argon Laser ablation of gastric AVM 1/14, followed by IV Iron therapy for Hg=7; her Hg improved to 9.3 then fell back to 8.8 & remained there (Fe improved from 51 to 177 after the infusion & has settled back to 70 since then)...  DrDBrodie asked that we recheck her from the Gerty standpoint due to her SOB>>     COPD> on Home  O2, Advair250, NEBS, Mucinex; remote smoker quit yrs ago; known obstructive & restrictive lung dis; last CXR 2/14 showed cardiomeg, no pulm edema, clear lungs, NAD; CTA 3/13 was neg for PE & showed some atelec, coronary calcif, multilevel spondylosis, & cirrhosis of the liver; she saw TP 3/14 w/ incr cough, thick yellow sput, congestion & post nasal drip> took Avelox & Mucinex w/ some improvement;  We decided to evaluate further>>  PFT today showed FVC=2.41 (83%), FEV1=1.77 (80%), %1sec=74, mid-flows= 61% predicted (all c/w min obstructive dis, +superimposed restriction)...  Ambulatory O2 sats showed 93% sat on RA at rest w/ HR=60; 87% sat after one lap on RA w/ HR=62... Other factors include 12# weight gain, weight =221#, very sedentary lifestyle, anemia (she did not want me to recheck this as it is being followed by DrDBrodie), and severe anxiety...  She thinks a lot of her SOB is from CHF siting her wt gain etc but a recent BNP 3/14 was ok at 180, she has a follow up appt w/ DrCooper soon...  I told her that in my opinion the biggest current issue (and most improvable factor) is anxiety & "chest wall muscle spasm" making her tight & hard to get a good breath "IN" as she described it; I went through my long explanation for this problems & told her that the BEST therapy for this was a "combination relaxer" like KLONOPIN & rec that she try 63m- 1/2 to 1 tab bid... All questions answered & she is agreeable to this plan... ADDENDUM >> pt called back & indicated that after reading the Pharm handout that she decided NOT to take the Klonopin, doesn't want a substitute...  ~  February 25, 2013:  263moOV & post hosp check> since I last saw [name]- she has been Adm 3 times by [facility]>>   Adm 5/25-27 for weakness assoc w/ IDA & GIB> Hx cirrhosis, angiodysplasia, and had EGD showing sm varicies, portal gastropathy, mult AVMs w/ laser ablation; Hg was 7.7=> 2uPCs & improved to 9 range; subseq capsule endoscopy showed  AVMs in upper sm intestines as well... Adm 6/9-12 for N&V assoc w/ constip & UTI (sens EColi- treated w/ Cipro) and underlying cirrhosis, portal gastropathy, angiodysplasia, etc (treated w/ PPI & monitoring of Hg & Fe- given 2uPRBCs & Fe infusions as outpt)...  Adm 02/19/81-42r DiastolicCHF (CXR- vasc congestion; 2DEcho- EF=55-60%, Gr2DD, mildAS/AI; Lasix added)...    COPD> on Home O2, Advair250, NEBS, Mucinex; remote smoker quit yrs ago; known obstructive & restrictive lung dis; CXR 6/14 showed mild cardiomeg, mild vasc congestion, linear atx at left base, otherw clear; CTA 3/13 was neg for PE & showed some atelec, coronary calcif, multilevel spondylosis, & cirrhosis of  the liver; she continues on O2 by [place] at 3L/min...     HBP> on Aten50, Lisinopril5, Lasix20-3/d, K10Qod; BP= 130/70 & she has mult somatic complaints but no specific CP, palpit, ch in SOB or edema; continue low sodium & wt reduction.     CAD, DiastolicCHF, & mild AS/AI> known non-obstructive CAD; hx atypCP/ CWP; no angina & she remains way too sedentary; discussed risk factor reduction strategy & she saw DrCooper 5/14> stable, no angina, too sedentary; last 2DEcho 7/13 w/ mild LVH, norm LVF w/ EF=55-60% & norm wall motion, mild AS&AI, Gr2DD;  EKG 5/14 w/ NSR, rate62, 1st degree AVB, NSSTTWA...    Chol> on Simva40; not really on diet & we discussed this; last FLP 5/13 showed TChol 94, TG 130, HDL 32, LDL 36; continue same, needs exercise!    DM> on Lantus20u & off all DM pills per DrEllison; needs much better diet & exercise program! Not checking BS at home; labs from 6/14 showed BS=169, A1c=6.7 & she will continue f/u w/ Endocrinology...    Obesity> way too sedentary, ?on diet & her wt is 218# today; we discussed diet, exercise, wt reduction strategies...    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    DJD/ FM/ ?osteopenia/ VitD defic> she has mult somatic complaints & takes Tylenol prn; on MVI + VitD 50K weekly...    RLS> on Requip4Qhs & Sinemet25/100Bid from DrDohmeier w/ fair control of symptoms- her note is reviewed...    Anxiety/ Depression/ PsycheHx> on RX per Psychiatry DrHejazi (we don't have notes from him) & she does not know her meds, didn't bring med bottles or med list!  As best we can tell she is on Nuvigil (on hold), Pristiq100, WellbutrinXL150, Seroquel100-4Qhs, Xanax56m prn- all from Psychiatry & she is reminded to bring all bottles to every visit... We reviewed prob list, meds, xrays and labs> see below for updates >>  LABS 7/14:  CBC- Hg=8.8, MCV=93, WBC=3.2, Plat=74K;  Fe=58 (14%sat), Ferritin=12.5..Marland KitchenMarland KitchenNOTE>> Fe is dropping & we decided to infuse FERAHEME [id number] as outpt... ROV 57m9mo~  March 29, 2013:  57mo43mo & [place] received her IV Feraheme 510mg48msince our last visit; still c/o no energy & SOB; she went to the ER 1wk ago w/ UTI- given Cipro but cult returned EColi resist Cipro & switched to Keflex (sens);  Due for f/u labs today>> We reviewed prob list, meds, xrays and labs> see below for updates >>  CXR 7/14 showed norm heart size, clear lungs, no edema etc... LABS 8/14:  Chems- ok x BS=257 Cr=1.4;  CBC- Hg=8.8 Fe=138 (35%sat)...  Rec>> needs f/u appt w/ DrEllison for endocrine & plan f/u labs monthly...  ~  May 31, 2013:  65mo R53mo since I last saw MarionGeorgeanas been to the ER twice, Hosp once, seen by Cards x3, GI x2, and DrEllison x2> complex story & a lot to get caught up on...    [facility] 05/27/13 by [facility] w/ incr SOB & DOE, found to have Hg=7.7 w/  pos stools, had some nausea but no abd pain; given one unit, given IV Iron; &  she had EGD by DrPyrtle- no varices, smHH, mild gastropathy, area of GAVE treated w/ APC, nodular mucosa (inflammed mucosa, neg HPylori, no dysplasia); Rec rx w/ PPI Bid & Carafate Qid, Aten changed to Corgard for better reduction in portal pressure...  CARDS>  BP stable at 128/68; she had f/u Cards- DrCooper 9/14> nonobstructive CAD, HBP, mildAS, diastolicCHF, HL, morbid obesity; Lasix had been added & adjusted, she is NYHA III, otherw no changes... DM>  Unfortunately her wt is up 14# to 229# today; followed by DrEllison & seen 9/14> DM, neuropathy, CAD; A1c interpret influenced by transfusions; they increased her Lantus to 200u daily...  GI>  Followed by DrDBrodie & DrPyrtle- EGD as above, she had EGD w/ APC for her GAVE... ANEMIA> she received Tx 1u and IV Iron; Hg stabilized ~9...  ~  July 01, 2013:  89mo ROV but in the interval [place] has had one ER visit (c/o SOB but had "a myriad of complaints"; no acute abn on her w/u w/ CXR, EKG, Labs; disch on same meds)... She saw DrEllison for DM f/u- her sugars ranged 90-400 & A1c=8.0; he changed her insulin dosing to an evening shot... She saw DrDBrodie for GI> hx advanced liver dis from autoimmune hepatitis, ?steatohepatitis, portal hypertension & gastropathy w/ GI blood loss, ?GAVE w/ prev ablation of angodysplasia; they followed up on her lab work & plan to check monthly CBC, Fe... She saw Urology, DrGrapey 10/14 & they noted some mild voiding symptoms and hx recurrent cystitis w/ "a completely pos ROS"; they rec treating UTI only id symptomatic w/ a urinary pathogen...     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 She is also c/o increased cough, congestion & yellow sput x2wks, says she fell & hit right side w/ some right rib  soreness; Recently treated w/ Levaquin but worried that she has pneumonia (CXR today is clear, ?atx in RML, and rib films w/ ?crack in right 7th rib?)...  We reviewed prob list, meds, xrays and labs> extensive med list reviewed w/ pt but she did not bring her med bottles...           Problem List:  COPD (ICD-496) & BRONCHITIS, RECURRENT (ICD-491.9) - Ex-smoker w/ underlying COPD on home O2, ADVAIR 250Bid, ALBUTEROL (via nebs or MDI for Prn use), & MUCINEX 1-2Bid... she has combined obstructive AND restrictive disease (based on her obesity) w/ diet (weight reduction) + exercise strongly recommended to the pt...  ~  CTChest 5/06 = neg. ~  baseline CXR w/ bilat LL scarring, NAD.... last CXR 3/09 = unchanged, NAD. ~  PFTs 3/09 showed FVC= 2.47 (83%), FEV1= 1.65 (69%), %1sec= 67, mid-flows= 28%pred. ~  CXR 4/12 showed bilat scarring, NAD... ~  PFT 10/12 showed FVC= 2.28 (77%), FEV1= 1.46 (64%), %1sec= 64, mid-flows= 31% pred. ~  CXR 3/13 showed normal heart size, prom hilar regions, no infiltrates etc, NAD.[name]. ~  CTAngio Chest 3/13 showed neg for PE- scarring at lung bases, no adenopathy or lung lesions, calcified coronaries, multilevel spondylosis in TSpine, cirrhosis in liver ~  CXR 2/14 showed cardiomeg, no pulm edema, clear lungs, NAD ~  PFT 5/14 showed FVC=2.41 (83%), FEV1=1.77 (  80%), %1sec=74, mid-flows= 61% predicted (all c/w min obstructive dis, +superimposed restriction) ~  Ambulatory O2 sats 5/14 showed 93% sat on RA at rest w/ HR=60; 87% sat after one lap on RA w/ HR=62 ~  7/14: on Home O2, Advair250, NEBS, Mucinex; remote smoker quit yrs ago; known obstructive & restrictive lung dis; CXR 6/14 showed mild cardiomeg, mild vasc congestion, linear atx at left base, otherw clear; CTA 3/13 was neg for PE & showed some atelec, coronary calcif, multilevel spondylosis, & cirrhosis of the liver; she continues on O2 by [place] at 3L/min...  ~  CXR 7/14 & 9/14 showed norm heart size, clear lungs, no edema  etc..   HYPERTENSION (ICD-401.9) >>  ~  on ATENOLOL 50mg /d,  LASIX 40mg /d & K20/d...  ~  10/11:  BP=116/78 and she has mult somatic complaints but all are diminished compared to prev visits; denies bad HA's, CP, palpit, syncope, edema, etc... ~  4/12:  BP= 140/60 and she persists w/ mult somatic complaints... ~  10/12:  BP= 122/74 & she has mult somatic complaints; needs better low sodium, calorie restricted diet & wt reduction... ~  3/13:  BP= 120/72 & she is unchanged symptomatically;  EKG 3/13 showed NSR, rate68, LAD, late transition, NSSTTWA... ~  7/13:  BP= 110/62 & she persists w/ mult somatic complaints... ~  10/13:  BP= 118/60 & she remains clinically stable... ~  7/14: on Aten50, Lisinopril5, Lasix20-3/d, K10Qod; BP= 130/70 & she has mult somatic complaints but no specific CP, palpit, ch in SOB or edema; continue low sodium & wt reduction. ~  7/14: DrEllison stopped her Aten50; f/u BP on Lisin5, Lasix60, & K10Qod = 122/72... ~  9/14:  BP= 128/68 & she is improved...  CAD (ICD-414.00) - Hosp at [facility] 4/09 w/ CP... cath showed non-obstructive CAD... no CP, palpit, etc (but she is way too sedentary). ~  cath 4/09 by DrCooper showed normLMAIN, 20% midLAD, norm CIRC, 20-30% proxRCA, EF= 50-55%... ~  EKG 1/11 showed SBrady, rate54, ?septal infarct, NSSTTWA, NAD... ~  EKG 3/13 showed NSR, rate68, LAD, late transition, NSSTTWA... ~  CTAngio Chest 3/13 showed calcif coronaries as noted... ~  DrCooper 7/13> stable, no angina, too sedentary; f/u 2DEcho w/ mild LVH, norm LVF w/ EF=55-60% & norm wall motion, mils AS & AI, Gr2DD... ~  9/14: she had f/u DrCooper w/ meds adjusted> Aten changed to Corgard, Lasix adjusted...  PALPITATIONS (ICD-785.1) >>  DIASTOLIC CHF >>  ~  eval by DrKlein w/ 7 beats WT on monitor... he changed Lisinopril to BBlocker (currently ATENOLOL 50mg /d) and improved... ~  she saw DrCopper for Cards f/u 1/11- palpit well controlled on Atenolol; hx non-obstructive CAD &  good LVF; no change in Rx. ~  [facility] 6/14 w/ Diastolic CHF and Lasix adjusted- Disch on Lasix20-3/d  AORTIC STENOSIS (ICD-424.1) - she has mild Ao Valve disease w/ AS/ AI... followed by DrCooper. ~  2DEcho 11/06 showed mild ca++ AoV w/ mild AS, norm LVF w/ EF=55-65% ~  repeat 2DEcho 1/09 showed similar mild ca++ AoV w/ mild AS, mild AI, no regional wall motion abn, norm LVf w/ EF= 60%... NOTE: est PAsys= 35... ~  repeat 2DEcho 1/10 showed norm LVF w/ EF= 55-60%, no regional wall motion abn, mild DD, mod calcif AoV & mild reduced leaflet excursion & mild AI... ~  Repeat 2DEcho 7/13 showed norm LVF w/ EF=55-65%, no regional wall motion abn, Gr2DD, mildAS, mildAI, mild LAdil... ~  DrCooper 7/13> stable, no angina, too sedentary; f/u  2DEcho w/ mild LVH, norm LVF w/ EF=55-60% & norm wall motion, mils AS & AI, Gr2DD... ~  7/14: known non-obstructive CAD; hx atypCP/ CWP; no angina & she remains way too sedentary; discussed risk factor reduction strategy & she saw DrCooper 5/14> stable, no angina, too sedentary; last 2DEcho 7/13 w/ mild LVH, norm LVF w/ EF=55-60% & norm wall motion, mild AS&AI, Gr2DD;  EKG 5/14 w/ NSR, rate62, 1st degree AVB, NSSTTWA...  HYPERCHOLESTEROLEMIA (ICD-272.0) - on SIMVASTATIN 40mg /d... ~  FLP 6/09 showed TChol 107, TG 69, HDL 35, LDL 59 ~  FLP 3/10 showed TChol 128, TG 125, HDL 33, LDL 70 ~  FLP 12/10 showed TChol 98, TG 64, HDL 37, LDL 48... continue same. ~  FLP 10/11 showed TChol 124, TG 105, HDL 37, LDL 66 ~  FLP 4/12 showed TChol 122, TG 95, HDL 37, LDL 66 ~  FLP 10/12 on Simva40 showed TChol 110, TG 116, HDL 34, LDL 53 ~  FLP 5/13 on Simva40 showed TChol 94, TG 130, HDL 32, LDL 36  DM (ICD-250.00) - hx irreg control, not really on diet... on LANTUS per drEllison,  & odd METFORMIN, GLIMEPIRIDE, JANUVIA...  ~  labs 10/08 BS=198, HgA1C=7.2 ~  labs 1/09 showed BS=119, HgA1c=6.0 ~  labs 6/09 showed BS= 248, HgA1c= 7.0..[name] discussed need for starting insulin if she can't get  on track and get wt down... ~  labs 9/09 showed BS= 236, HgA1c= 7.6.Marland KitchenMarland Kitchen rec- start Lantus 5u/d... grad increased to 10u. ~  labs 11/09 showed BS= 166, HgA1c= 6.0.Marland KitchenMarland Kitchen rec- great! keep same. ~  labs 3/10 showed BS= 169, A1c= 6.8 ~  labs 12/10 showed BS= 149, A1c= 5.8, Umicroalb= neg. ~  labs 10/11 showed BS= 215, A1c= 7.1.[name]. rec> better diet, get wt down. ~  Labs 4/12 showed BS= 172, A1c=5.8 ~  Labs 10/12 showed BS= 245, A1c= 8.1.Marland KitchenMarland Kitchen rec to take meds every day; incr Lantus dose 10==> 20u/d... ~  Labs 3/13 on Lantus20+49meds showed BS= 276, A1c= 6.8 (improved)... ~  4/13:  No diabetic retinopathy per optometrist at [facility]... ~  Labs 5/13 on Lantus20+62meds showed BS= 191, A1c= 6.9 ==> subsequently BS incr & Lantus incr ~  Labs 7/13 on Lantus40+26meds showed BS= 339, A1c= 7.4.Marland KitchenMarland Kitchen ?why control is off, rec move shots around & incr by 5u/wk til BS<200. ~  Labs 10/13 on Lantus25+70meds showed BS= 164, A1c= 5.0.Marland KitchenMarland Kitchen Too tight & rec to decr Lantus20 & decr Glimep4mg  to 1/2 tab... ~  She has been adjusting her meds on her own in the interval => referred to DrEllison, Endocrinology... ~  7/14: on Lantus20u & off all DM pills per DrEllison; needs much better diet & exercise program! Not checking BS at home; labs from 6/14 showed BS=169, A1c=6.7 & she will continue f/u w/ Endocrinology. ~  7/14 f/u DrEllison & stopped all DM pills and increased her insulin to Lantus50Bid ~  9/14:  DrEllison increased her Lantus to 200u/d...  MORBID OBESITY (ICD-278.01) - not really on diet or exercise program... ~  weight 6/09 = 223# ~  weight 11/09 = 228# ~  weight 3/10 = 225# ~  weight 6/10 = 217# ~  weight 9/10 = 213# ~  weight 12/10 = 224# ~  weight 2/11 = 216# ~  weight 10/11 = 230# ~  Weight 4/12 = 222# ~  Weight 10/12 = 225# ~  Weight 3/13 = 215#...  great job, keep it up... 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  ~  EGD  2/06 showed angiodysplasia & mult gastric pseudopolyps, watermelon stomach improved from 2003. ~  recent f/u EGD 9/09 showed chr gastritis, no watermelon stomach & improved from prev... ~  recurrent anemia 12/10 w/ f/u EGD 2/11 showing gastric antral avm's- s/p argon laser ablation, no varicies etc... 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 ~  5/13:  She is not on PPI and c/o some abd discomfort- rec starting PROTONIX 40mg /d... ~  She saw DrDBrodie 10/13 w/ c/o dysphagia + f/u of her autoimmune liver dis, portal HTN, cirrhosis, "watermelon stomach" & hx argon laser ablation of gastric AVMs (chr GI blood loss w/ periodic Fe infusions- last 8/13); EGD 10/13 showed norm esoph mucosa, no varicies, spasm at the LES but no stricture found; dilated to 34F; mod severe portal hypertensive gastropathy in stomach, no active bleeding; REC> continue PPI, try Levsin... ~  1/14:  She had EGD & Argon Laser ablation of gastric AVM by DrDBrodie... 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 ~  9/14:  She was Hosp by [facility] & seen by GI- drPyrtle w/ EGD (no varices, smHH, mild gastropathy, area of GAVE treated w/ APC, nodular mucosa (inflammed mucosa, neg HPylori, no dysplasia)...  DIVERTICULOSIS OF COLON (ICD-562.10) & POLYP, COLON (ICD-211.3) -  ~  last colonoscopy 10/08 w/ adenomatous polyp removed, and angiodysplasia without  hemorrhage ~  CT Abd 10/08 showed Cirrhosis, borderline splenomeg, sl pancreatic atrophy & duct dil, sm umbil hernia, DJD sp, NAD... ~  10/13: she had f/u drDBrodie> last colonoscopy was 2008, she felt pt was too high risk for prep & sedation for a f/u colonoscopy & they decided to hold off...  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 ~  10/11:  presents w/ dysuria & UTI Rx w/ Cipro... ~  12/11:  She had f/u DrGrapey... ~  6/14: she was treated for a sens EColi UTI during 6/14 Hosp w/ Cipro...  OSTEOARTHRITIS (ICD-715.90)  FIBROMYALGIA (ICD-729.1) - she persists w/ mult somatic complaints...  ? of OSTEOPOROSIS (ICD-733.00) - she had normal BMD in 2001 at [place]..  RESTLESS LEGS SYNDROME (ICD-333.94) - she prev weaned off the Requip & remains on SINEMET-CR 25-100Bid... ~  9/10: now c/o recurrent restless leg symptoms... restart REQUIP 2mg  &  titrate up. ~  12/10: persistant symptoms on 2mg Qhs... rec to incr to 4mg ... ~  2/11: she reports resolution of RLS on 4mg  Requip Qhs, resting well now. ~  12/11:  Recurrent RLS symptoms despite the Requip & seen by DrDohmeier w/ addition of Sinemet 25/100 Bid... ~  2012:  Stable on Requip4mg  & Sinemet 25/100 Bid... ~  7/13:  She is once again c/o incr RLS symptoms & rec to restart REQUIP 4mg /d...  ~  8/13: she had f/u visit w/ DrDohmeier> RLS, tremors, & insomnia; Seroquel helped for awhile, she is working w/ Community education officer to help her problems... ~  7/14: on Requip4Qhs & Sinemet25/100Bid from  DrDohmeier (RLS, Trmeor, Gait abn) w/ fair control of symptoms- her note is reviewed & they stopped the Sinemet...  DYSTHYMIA (ICD-300.4) - Hx depression w/ psychotic features and hosp 2/09 at [facility] w/ delusions of pedunculosis> she was prev treated by Dr. Bartholomew Crews & DrSuttenfield for counselling...  Adm to [facility] in May09 by DrBarbSmith w/ depression and meds adjusted (see DC summary)...  Now followed by [name] and [name] at [facility]- she was re-admitted 10/09 and meds changed to RISPERDAL, PRISTIQUE, BUPROPION, SEROQUEL, XANAX & Nuvigil (Armodafinil)...  husb indicates that she is much better at present & continues outpt Rx... ~  MRI/ [facility]RA Brain 2/08 showed sm vessel dis & atherosclerotic changes in post circ... ~  9/10: pt indicates that she is off the Trazodone... DrHejazi added Wellbutrin ?for her RLS? ~  2011:  pt continues regular f/u w/ Psychiatry on numerous meds w/ freq med adjustments; she is requested to get notes from [facility] to our attention & bring all med bottles to her OVs for review... ~  2012:  We have not received any notes from [facility]; pt has not complied w/ our request for med bottles to be brought to each OV... ~  2013:  We still don't have accurate list of her current meds as we have not received [facility] notes & she NEVER brings meds to office despite our requests... ~  7/14: on RX per Psychiatry DrHejazi (we don't have notes from him) & she does not know her meds, didn't bring med bottles or med list!  As best we can tell she is on Nuvigil (on hold), Pristiq100, WellbutrinXL150, Seroquel100-4Qhs, Xanax1mg  prn- all from Psychiatry & she is reminded to bring all bottles to every visit...   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 ~  labs 04/18/08 by DrBrodie showed Hg= 10.4, Fe= 52... she ordered 1,200mg  IV Iron- given 04/26/08. ~  labs 05/19/08 showed Hg= 11.3, Fe= 117... ~  labs 11/09 showed Hg= 12.0.Marland KitchenMarland Kitchen ~  labs 3/10 showed Hg= 12.3, Fe= 79 ~  labs 6/10 showed Hg= 11.5, Fe= 73 ~  labs 12/10 showed Hg= 7.2, Fe= 28 (6%sat)... given [facility] & IV Fe per DrBrodie in Jan2012. ~  labs 2/11 showed Hg= 11.8, Fe= 95... pt indicates that DrBrodie will be following labs going forward... ~  labs 6/11 showed Hg= 11.2 ~  labs 9/11 showed Hg= 11.1, Fe= 106 ~  Labs 4/12 showed Hg=11.0, Fe=118 ~  Labs 10/12 showed Hg=9.5, Fe=35 (7%sat)... We will arrange for outpt infusion 1000mg  Fe... ~  Labs 12/12 showed Hg=11.4, Fe=92 (26%sat) ~  Labs 3/13 showed Hg=11.2, Fe=71 (17%sat) ~  Labs  5/13 showed Hg=9.8, Fe=53 (13%sat) ~  04/22/12:  She was given 1000mg  IV Iron Dextran by DrDBrodie... ~  Labs 10/13 showed Hg=11.1, Fe=76 (23%) ~  Labs 1/14 showed Hg=7.0 (down 4pts) Hct=21.9 Fe=51 (11%sat) B12=623, Protime=11.6/ 1.1, AFP=pending; Abd sonar per DrDBrodie- cirrhosis, no ascites. ~  Given IV Iron by DrDBrodie w/ Hg improved to 9.3 & Fe up to 177... ~  Subsequently her Hg settled back to 8.8 & Iron dropped to 70... The pt did not want me to recheck her labs- "DrBrodie does it" ~  [facility] 6/14 w/ further GIB and had EGD, C[facility] Endo, etc- see above...  We are back to checking CBC, Fe monthly to check for needed iron infusions... ~  Labs 7/14 showed Hg= 8.8 and Fe= 58 (14%) so we decided to proceed w/ IV Fe Feraheme 510mg  x2 infusions... ~  Labs 8/14 s/p IV Feraheme x2 showed Hg= 8.8, Fe= 138 (35%sat); Rec to recheck labs Qmo... ~  9/14:  She was hosp by [facility] w/ SOB, anemia- Tx 1u, IV Fe given...   Past Surgical History  Procedure Laterality Date  . Hemorroidectomy    . Appendectomy    . Vaginal hysterectomy    . Breast biopsy      bilateral  . Cesarean section      x 3  . Appendectomy    . Esophagogastroduodenoscopy   06/12/2012    Procedure: ESOPHAGOGASTRODUODENOSCOPY (EGD);  Surgeon: Hart Carwin, MD;  Location: [facility] END[facility]COPY;  Service: Endoscopy;  Laterality: N/A;  . Balloon dilation  06/12/2012    Procedure: BALLOON DILATION;  Surgeon: Hart Carwin, MD;  Location: [facility] ENDOSCOPY;  Service: Endoscopy;  Laterality: N/A;  ?balloon  . Esophagogastroduodenoscopy  09/10/2012    Procedure: ESOPHAGOGASTRODUODENOSCOPY (EGD);  Surgeon: Hart Carwin, MD;  Location: [facility]OSCOPY;  Service: Endoscopy;  Laterality: N/A;  . Hot hemostasis  09/10/2012    Procedure: HOT HEMOSTASIS (ARGON PLASMA COAGULATION/BICAP);  Surgeon: Hart Carwin, MD;  Location: [facility];  Service: Endoscopy;  Laterality: N/A;  . Esophagogastroduodenoscopy N/A 01/18/2013    Procedure: ESOPHAGOGASTRODUODENOSCOPY (EGD);  Surgeon: Hart Carwin, MD;  Location: [facility] ENDOSCOPY;  Service: Endoscopy;  Laterality: N/A;  . Esophagogastroduodenoscopy N/A 05/24/2013    Procedure: ESOPHAGOGASTRODUODENOSCOPY (EGD);  Surgeon: Beverley Fiedler, MD;  Location: [facility] ENDOSCOPY;  Service: Gastroenterology;  Laterality: N/A;  . Hot hemostasis N/A 05/24/2013    Procedure: HOT HEMOSTASIS (ARGON PLASMA COAGULATION/BICAP);  Surgeon: Beverley Fiedler, MD;  Location: [facility] ENDOSCOPY;  Service: Gastroenterology;  Laterality: N/A;    Outpatient Encounter Prescriptions as of 07/01/2013  Medication Sig  . albuterol (PROAIR HFA) 108 (90 BASE) MCG/ACT inhaler Inhale 2 puffs into the lungs every 6 (six) hours as needed for wheezing or shortness of breath.   . ALPRAZolam (XANAX) 1 MG tablet Take 1 mg by mouth 3 (three) times daily as needed for anxiety.   . AMBULATORY NON FORMULARY MEDICATION Continuous O2 @@ 3LMP  . B-D ULTRAFINE III SHORT PEN 31G X 8 MM MISC USE AS DIRECTED  . buPROPion (WELLBUTRIN XL) 150 MG 24 hr tablet Take 150 mg by mouth daily.  [name] desvenlafaxine (PRISTIQ) 100 MG 24 hr tablet Take 150 mg by mouth daily.  [name] dextromethorphan (DELSYM) 30 MG/5ML liquid Take 60 mg by mouth as  needed for cough.  [name] FREESTYLE LITE test strip TEST BLOOD SUGAR 3 TIMES DAILY  . furosemide (LASIX) 20 MG tablet Take 40 mg by mouth 2 (two) times daily.   . Insulin  Glargine (LANTUS SOLOSTAR [place]) Inject 150 Units into the skin daily before supper.   [name] lisinopril (PRINIVIL,ZESTRIL) 5 MG tablet Take 5 mg by mouth at bedtime.   . Multiple Vitamins-Minerals (CENTRUM SILVER PO) Take by mouth daily.  . nadolol (CORGARD) 20 MG tablet Take 1 tablet (20 mg total) by mouth daily.  . pantoprazole (PROTONIX) 40 MG tablet Take 1 tablet (40 mg total) by mouth 2 (two) times daily.  . polyethylene glycol (MIRALAX / GLYCOLAX) packet Take 17 g by mouth daily as needed (constipation).  . potassium chloride SA (K-DUR,KLOR-CON) 10 MEQ tablet Take 1 tablet (10 mEq total) by mouth every other day.  . promethazine (PHENERGAN) 25 MG tablet Take 1 tablet (25 mg total) by mouth 2 (two) times daily as needed for nausea.  [name] QUEtiapine (SEROQUEL) 100 MG tablet Take 400 mg by mouth at bedtime.   [name] rOPINIRole (REQUIP) 4 MG tablet Take 4 mg by mouth at bedtime.  . Saline (OCEAN NASAL SPRAY NA) Place 1 spray into the nose daily as needed (congestion).  . Simethicone (GAS-X PO) Take 1 tablet by mouth daily as needed (flatulence).   . simvastatin (ZOCOR) 40 MG tablet Take 40 mg by mouth at bedtime.  . sucralfate (CARAFATE) 1 GM/10ML suspension Take 10 mL (1 g total) by mouth 2 (two) times daily.  . Vitamin D, Ergocalciferol, (DRISDOL) 50000 UNITS CAPS capsule Take 50,000 Units by mouth every 7 (seven) days. On fridays  . [DISCONTINUED] ADVAIR DISKUS 250-50 MCG/DOSE AEPB INHALE 1 PUFF INTO THE LUNGS 2 (TWO) TIMES DAILY.  . [DISCONTINUED] desoximetasone (TOPICORT) 0.05 % cream Apply topically 2 (two) times daily.  . [DISCONTINUED] levofloxacin (LEVAQUIN) 500 MG tablet Take 1 tablet (500 mg total) by mouth every morning.  [name] b complex vitamins tablet Take 1 tablet by mouth daily.   [name] lidocaine (LIDODERM) 5 % Place 1 patch onto the  skin daily. Remove & Discard patch within 12 hours or as directed by MD  . [DISCONTINUED] Fluticasone-Salmeterol (ADVAIR DISKUS) 250-50 MCG/DOSE AEPB Inhale 1 puff into the lungs 2 (two) times daily.    Allergies  Allergen Reactions  . Aspirin Other (See Comments)    Causes nosebleeds  . Penicillins Itching  . Theophylline Nausea And Vomiting    Current Medications, Allergies, Past Medical History, Past Surgical History, Family History, and Social History were reviewed in [employer] record.    Review of Systems         See HPI - all other systems neg except as noted...      The patient complains of weight gain, dyspnea on exertion, muscle weakness, difficulty walking, and depression.  The patient denies anorexia, fever, weight loss, vision loss, decreased hearing, hoarseness, chest pain, syncope, peripheral edema, prolonged cough, headaches, hemoptysis, abdominal pain, melena, hematochezia, severe indigestion/heartburn, hematuria, incontinence, suspicious skin lesions, transient blindness, unusual weight change, abnormal bleeding, enlarged lymph nodes, and angioedema.     Objective:   Physical Exam     WD, Obese, 72 y/o WF in NAD... she is chr ill appearing... GENERAL:  Alert & oriented x 3... HEENT:  [place]/AT, pale conjuct, EOM-wnl, PERRLA, EACs-clear, TMs-wnl, NOSE-clear, THROAT-clear w/ dry MMs... NECK:  Supple w/ fairROM; no JVD; normal carotid impulses w/o bruits; no thyromegaly or nodules palpated; no lymphadenopathy. CHEST:  Clear to P & A; without wheezes/ rales/ or rhonchi heard... HEART:  Regular Rhythm;  gr 1/6 SEM without rubs or gallops... ABDOMEN:  Obese, soft & nontender w/ panniculus; normal bowel  sounds; no organomegaly or masses detected. EXT: without deformities, mod arthritic changes; no varicose veins/ +venous insuffic/ tr edema. NEURO:  CN's intact;  no focal neuro deficits... DERM:  No lesions noted; no rash, pale complexion.  RADIOLOGY  DATA:  Reviewed in the EPIC EMR & discussed w/ the patient...  LABORATORY DATA:  Reviewed in the EPIC EMR & discussed w/ the patient...   Assessment & Plan:    DYSPNEA> clearly this is multifactorial w/ components from Lung dis, CHF, Obesity, inactivity, Anemia, etc; a large component is likely related to anxiety & chest "tightness" for which she has Xanax 1mg  from her psychiatrists...  COPD>  Combined mild obstructive & mod restrictive lung dis; PFT showed sl deterioration in FEV1; multifactorial dyspnea including stress; REC> continue Advair, NEBS, O2, Mucinex, etc; get on diet/ exercise/ get weight down; continue [facility] f/u for help w/ anxiety...  HBP>  Controlled on low dose BBlocker & ACE, plus diuretic> continue same & work on weight reduction...  CAD, DiastolicCHF>  Known nonobstructive dis, way too sedentary> we discussed diet + exercise...  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  CHOL>  Stable on the Simva40...  DM>  Managed by DrEllison on Lantus insulin currently...  OBESITY>  Reviewed diet + exercise needed...  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  Autoimmune hepatitis>  Also managed by DrBrodie, off the Immuran at present... She also has pancytopenia related to her cirrhosis.  RLS>  Notes from DrDohmeier reviewed> on Sinemet + needs to restart Requip...  Dysthymia>  followed by Psyche on mult meds...  Anemia>  We will place her back on our monthly protocol of CBC & Iron checks...   Patient's Medications  New Prescriptions   INSULIN LISPRO (HUMALOG KWIKPEN) 100 UNIT/ML SOPN    Inject 10 Units into the skin daily with supper.   LIDOCAINE (LIDODERM) 5 %    Place 1 patch onto the skin daily. Remove & Discard patch within 12 hours or as directed by MD   TRAMADOL (ULTRAM) 50 MG TABLET    Take 1 tablet (50 mg total) by mouth 3 (three)  times daily as needed.  Previous Medications   ALBUTEROL (PROAIR HFA) 108 (90 BASE) MCG/ACT INHALER    Inhale 2 puffs into the lungs every 6 (six) hours as needed for wheezing or shortness of breath.    ALPRAZOLAM (XANAX) 1 MG TABLET    Take 1 mg by mouth 3 (three) times daily as needed for anxiety.    AMBULATORY NON FORMULARY MEDICATION    Continuous O2 @@ 3LMP   B COMPLEX VITAMINS TABLET    Take 1 tablet by mouth daily.    B-D ULTRAFINE III SHORT PEN 31G X 8 MM MISC    USE AS DIRECTED   BUPROPION (WELLBUTRIN XL) 150 MG 24 HR TABLET    Take 150 mg by mouth daily.   DESVENLAFAXINE (PRISTIQ) 100 MG 24 HR TABLET    Take 150 mg by mouth daily.   DEXTROMETHORPHAN (DELSYM) 30 MG/5ML LIQUID    Take 60 mg by mouth as needed for cough.   FREESTYLE LITE TEST STRIP    TEST BLOOD SUGAR 3 TIMES DAILY   FUROSEMIDE (LASIX) 20 MG TABLET    Take 40 mg by mouth 2 (two) times daily.    INSULIN GLARGINE (LANTUS SOLOSTAR [place])    Inject 150 Units into  the skin daily before supper.    LISINOPRIL (PRINIVIL,ZESTRIL) 5 MG TABLET    Take 5 mg by mouth at bedtime.    MULTIPLE VITAMINS-MINERALS (CENTRUM SILVER PO)    Take by mouth daily.   NADOLOL (CORGARD) 20 MG TABLET    Take 1 tablet (20 mg total) by mouth daily.   PANTOPRAZOLE (PROTONIX) 40 MG TABLET    Take 1 tablet (40 mg total) by mouth 2 (two) times daily.   POLYETHYLENE GLYCOL (MIRALAX / GLYCOLAX) PACKET    Take 17 g by mouth daily as needed (constipation).   POTASSIUM CHLORIDE SA (K-DUR,KLOR-CON) 10 MEQ TABLET    Take 1 tablet (10 mEq total) by mouth every other day.   PROMETHAZINE (PHENERGAN) 25 MG TABLET    Take 1 tablet (25 mg total) by mouth 2 (two) times daily as needed for nausea.   QUETIAPINE (SEROQUEL) 100 MG TABLET    Take 400 mg by mouth at bedtime.    ROPINIROLE (REQUIP) 4 MG TABLET    Take 4 mg by mouth at bedtime.   SALINE (OCEAN NASAL SPRAY NA)    Place 1 spray into the nose daily as needed (congestion).   SIMETHICONE (GAS-X PO)    Take 1 tablet by  mouth daily as needed (flatulence).    SIMVASTATIN (ZOCOR) 40 MG TABLET    Take 40 mg by mouth at bedtime.   SUCRALFATE (CARAFATE) 1 GM/10ML SUSPENSION    Take 10 mL (1 g total) by mouth 2 (two) times daily.   VITAMIN D, ERGOCALCIFEROL, (DRISDOL) 50000 UNITS CAPS CAPSULE    Take 50,000 Units by mouth every 7 (seven) days. On fridays  Modified Medications   Modified Medication Previous Medication   FLUTICASONE-SALMETEROL (ADVAIR DISKUS) 250-50 MCG/DOSE AEPB ADVAIR DISKUS 250-50 MCG/DOSE AEPB      INHALE 1 PUFF INTO THE LUNGS 2 (TWO) TIMES DAILY.    INHALE 1 PUFF INTO THE LUNGS 2 (TWO) TIMES DAILY.  Discontinued Medications   DESOXIMETASONE (TOPICORT) 0.05 % CREAM    Apply topically 2 (two) times daily.   FLUTICASONE-SALMETEROL (ADVAIR DISKUS) 250-50 MCG/DOSE AEPB    Inhale 1 puff into the lungs 2 (two) times daily.   LEVOFLOXACIN (LEVAQUIN) 500 MG TABLET    Take 1 tablet (500 mg total) by mouth every morning.

## 2013-07-19 ENCOUNTER — Other Ambulatory Visit: Payer: Self-pay | Admitting: Pulmonary Disease

## 2013-07-20 ENCOUNTER — Telehealth: Payer: Self-pay | Admitting: Pulmonary Disease

## 2013-07-20 NOTE — Telephone Encounter (Signed)
Spoke to pt. States that she fell 2 weeks ago. Thinks she may have broken a rib. She is wanting an xray to see what is causing her so much pain.  SN - please advise.

## 2013-07-20 NOTE — Telephone Encounter (Signed)
Pt is aware to follow up with her Ortho.

## 2013-07-20 NOTE — Telephone Encounter (Signed)
Per SN---  ?shoulder pain---she will need to follow up with her ortho doctor for the shoulder pain.

## 2013-07-27 ENCOUNTER — Ambulatory Visit: Payer: Medicare Other | Admitting: Adult Health

## 2013-07-27 ENCOUNTER — Telehealth: Payer: Self-pay | Admitting: Pulmonary Disease

## 2013-07-27 MED ORDER — FLUTICASONE-SALMETEROL 250-50 MCG/DOSE IN AEPB: INHALATION_SPRAY | RESPIRATORY_TRACT | Status: DC

## 2013-07-27 MED ORDER — HYDROCODONE-ACETAMINOPHEN 5-325 MG PO TABS: 1.0000 | ORAL_TABLET | Freq: Three times a day (TID) | ORAL | Status: DC | PRN

## 2013-07-27 NOTE — Telephone Encounter (Signed)
I spoke with [name]-he states that [name] was unable to keep appt with TP today as no one could drive her here nor would anyone relieve him to bring her. Pt fell about 3 weeks ago and was told by SN that she had a cracked rib and it would take a while to heal per [name]. [name] stated that the patient told him that she is still in a lot of pain and now her shoulder has begun to hurt. Would like recs of what to do next. TP has no openings until Friday. SN please advise.   [name] aware that 1 sample of Adviar 250/50 has been placed at front desk for pick up.

## 2013-07-27 NOTE — Telephone Encounter (Signed)
Called and spoke with pt and she states that she made herself and appt with [facility] this Friday but needs comething for pain until then.  Pt states that the Tramadol that we called in for her she was unable to take due to and possible interaction with Pristiq.  Please advise .

## 2013-07-27 NOTE — Telephone Encounter (Signed)
Patient states she missed appt this morning due to transportation.  Patient fell last week and cracked a rib.  She c/o intense pain and is requesting to discuss with nurse.

## 2013-07-27 NOTE — Telephone Encounter (Signed)
Per  SN ---  Try vicodin   #90  1 po tid prn pain.  Will need ortho appt asap.  i called and spoke with pts husband and he is aware of SN recs and he is on his way to pick up rx for the pt.  Nothing further is needed.

## 2013-07-27 NOTE — Telephone Encounter (Signed)
Per SN---  Needs appt with ortho for eval and xrays.  thanks

## 2013-07-28 ENCOUNTER — Other Ambulatory Visit (INDEPENDENT_AMBULATORY_CARE_PROVIDER_SITE_OTHER): Payer: Medicare Other

## 2013-07-28 DIAGNOSIS — D509 Iron deficiency anemia, unspecified: Secondary | ICD-10-CM

## 2013-07-28 LAB — CBC WITH DIFFERENTIAL/PLATELET
Eosinophils Absolute: 0.1 10*3/uL (ref 0.0–0.7)
Eosinophils Relative: 2.3 % (ref 0.0–5.0)
HCT: 25.5 % — ABNORMAL LOW (ref 36.0–46.0)
Hemoglobin: 8.7 g/dL — ABNORMAL LOW (ref 12.0–15.0)
Lymphs Abs: 0.3 10*3/uL — ABNORMAL LOW (ref 0.7–4.0)
MCHC: 34.1 g/dL (ref 30.0–36.0)
MCV: 97.9 fl (ref 78.0–100.0)
Monocytes Absolute: 0.4 10*3/uL (ref 0.1–1.0)
Neutro Abs: 3.9 10*3/uL (ref 1.4–7.7)
Neutrophils Relative %: 81 % — ABNORMAL HIGH (ref 43.0–77.0)
Platelets: 89 10*3/uL — ABNORMAL LOW (ref 150.0–400.0)
WBC: 4.8 10*3/uL (ref 4.5–10.5)

## 2013-07-28 LAB — IBC PANEL: Iron: 136 ug/dL (ref 42–145)

## 2013-07-29 ENCOUNTER — Telehealth: Payer: Self-pay | Admitting: Pulmonary Disease

## 2013-07-29 ENCOUNTER — Telehealth: Payer: Self-pay | Admitting: *Deleted

## 2013-07-29 DIAGNOSIS — D509 Iron deficiency anemia, unspecified: Secondary | ICD-10-CM

## 2013-07-29 NOTE — Telephone Encounter (Signed)
Message copied by Daphine Deutscher on Thu Jul 29, 2013  9:06 AM ------      Message from: Daphine Deutscher      Created: Wed Jun 30, 2013  8:41 AM       Call and remind pt. Due for CBC for DB on 08/02/13 ------

## 2013-07-29 NOTE — Telephone Encounter (Signed)
error 

## 2013-07-30 NOTE — Telephone Encounter (Signed)
Called and spoke with pt and she is aware of lab results per SN.  Nothing further is needed. 

## 2013-08-03 ENCOUNTER — Telehealth: Payer: Self-pay | Admitting: Pulmonary Disease

## 2013-08-03 MED ORDER — METHYLPREDNISOLONE (PAK) 4 MG PO TABS: ORAL_TABLET | ORAL | Status: DC

## 2013-08-03 MED ORDER — CEFDINIR 300 MG PO CAPS: 300.0000 mg | ORAL_CAPSULE | Freq: Two times a day (BID) | ORAL | Status: DC

## 2013-08-03 NOTE — Telephone Encounter (Signed)
Called, spoke with Okey Regal with [facility]. She visited with pt today. Reports pt is coughing up thick, green mucus.  Pt reports a low grade temp of 99.3.  Upon [name]'s exam, temp was 97, lung sounds diminished with expiratory wheezing in the bases, o2 sat 94% on o2.  Okey Regal states pt reported this is "how she feels when she has pna."  Pt was given levaquin a few wks ago.  Okey Regal would like to know if SN would like to try something else.  She doesn't think pt needs steroids at this time.  Pt is in no distress.  Dr. Kriste Basque, pls advise.  Thank you.  Allergies  Allergen Reactions  . Aspirin Other (See Comments)    Causes nosebleeds  . Penicillins Itching  . Theophylline Nausea And Vomiting

## 2013-08-03 NOTE — Telephone Encounter (Signed)
Pt and [name] both called back. Aware of recs. Rx has been sent. Okey Regal reports she will see pt next week. Nothing further needed

## 2013-08-03 NOTE — Telephone Encounter (Signed)
Per SN---  cefdnir 300 mg  #20  1 po bid Align once daily Medrol dosepak

## 2013-08-03 NOTE — Telephone Encounter (Signed)
lmomtcb x1 for [name] and pt

## 2013-08-03 NOTE — Telephone Encounter (Signed)
Returning call.

## 2013-08-04 ENCOUNTER — Other Ambulatory Visit (HOSPITAL_COMMUNITY): Payer: Self-pay | Admitting: *Deleted

## 2013-08-05 ENCOUNTER — Encounter (HOSPITAL_COMMUNITY)
Admission: RE | Admit: 2013-08-05 | Discharge: 2013-08-05 | Disposition: A | Discharge status: Home / Self Care | Payer: Medicare Other | Source: Ambulatory Visit | Attending: Pulmonary Disease | Admitting: Pulmonary Disease

## 2013-08-05 DIAGNOSIS — D509 Iron deficiency anemia, unspecified: Secondary | ICD-10-CM | POA: Insufficient documentation

## 2013-08-05 LAB — POCT HEMOGLOBIN-HEMACUE: Hemoglobin: 7.7 g/dL — ABNORMAL LOW (ref 12.0–15.0)

## 2013-08-05 MED ORDER — DARBEPOETIN ALFA-POLYSORBATE 200 MCG/0.4ML IJ SOLN
INTRAMUSCULAR | Status: AC
  Administered 2013-08-05: 200 ug via SUBCUTANEOUS
  Filled 2013-08-05: qty 0.4

## 2013-08-05 MED ORDER — DARBEPOETIN ALFA-POLYSORBATE 200 MCG/0.4ML IJ SOLN
200.0000 ug | INTRAMUSCULAR | Status: DC
  Administered 2013-08-05: 200 ug via SUBCUTANEOUS

## 2013-08-06 ENCOUNTER — Telehealth: Payer: Self-pay | Admitting: Pulmonary Disease

## 2013-08-06 NOTE — Telephone Encounter (Signed)
Nothing in patient's chart where we called since her last triage message on 12.9.14 Called and discussed this with patient Pt will disregard message Will sign off

## 2013-08-09 ENCOUNTER — Encounter: Payer: Self-pay | Admitting: Physician Assistant

## 2013-08-09 ENCOUNTER — Ambulatory Visit (INDEPENDENT_AMBULATORY_CARE_PROVIDER_SITE_OTHER): Payer: Medicare Other | Admitting: Physician Assistant

## 2013-08-09 VITALS — BP 144/62 | HR 46 | Ht 64.0 in | Wt 223.8 lb

## 2013-08-09 DIAGNOSIS — J449 Chronic obstructive pulmonary disease, unspecified: Secondary | ICD-10-CM

## 2013-08-09 DIAGNOSIS — D509 Iron deficiency anemia, unspecified: Secondary | ICD-10-CM

## 2013-08-09 DIAGNOSIS — R0609 Other forms of dyspnea: Secondary | ICD-10-CM

## 2013-08-09 DIAGNOSIS — I2510 Atherosclerotic heart disease of native coronary artery without angina pectoris: Secondary | ICD-10-CM

## 2013-08-09 DIAGNOSIS — I498 Other specified cardiac arrhythmias: Secondary | ICD-10-CM

## 2013-08-09 DIAGNOSIS — I10 Essential (primary) hypertension: Secondary | ICD-10-CM

## 2013-08-09 DIAGNOSIS — I359 Nonrheumatic aortic valve disorder, unspecified: Secondary | ICD-10-CM

## 2013-08-09 DIAGNOSIS — R001 Bradycardia, unspecified: Secondary | ICD-10-CM

## 2013-08-09 DIAGNOSIS — I5032 Chronic diastolic (congestive) heart failure: Secondary | ICD-10-CM

## 2013-08-09 DIAGNOSIS — R0600 Dyspnea, unspecified: Secondary | ICD-10-CM

## 2013-08-09 LAB — BASIC METABOLIC PANEL
Chloride: 101 mEq/L (ref 96–112)
Creatinine, Ser: 1.3 mg/dL — ABNORMAL HIGH (ref 0.4–1.2)
GFR: 43.42 mL/min — ABNORMAL LOW (ref >60.00)
Potassium: 4.3 mEq/L (ref 3.5–5.1)

## 2013-08-09 MED ORDER — NADOLOL 20 MG PO TABS: 10.0000 mg | ORAL_TABLET | Freq: Every day | ORAL | Status: DC

## 2013-08-09 NOTE — Patient Instructions (Signed)
DECREASE NADOLOL TO 10 MG DAILY; MONITOR BLOOD PRESSURE AND CALL IF BP IS STAYING ABOVE 140/90 ; [phone] SCOTT WEAVER, PAC  LAB WORK TODAY; BMET  Your physician wants you to follow-up in: 4 MONTHS WITH DR. Excell Seltzer.  You will receive a reminder letter in the mail two months in advance. If you don't receive a letter, please call our office to schedule the follow-up appointment.

## 2013-08-09 NOTE — Progress Notes (Signed)
[address] [place], [place]  [address] Phone: [phone] Fax:  [phone]  Date:  08/09/2013   ID:  [name], DOB 28-Aug-1940, MRN [id number]  PCP:  Michele Mcalpine, MD  Cardiologist:  Dr. Tonny Bollman     History of Present Illness: [name] is a 72 y.o. 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  LHC 11/2007: Dx 20%, proximal and mid RCA 20-30%, EF 50-55%.  Echo 01/2013: EF 55-60%, grade 2 diastolic dysfunction, very mild aortic stenosis, mean gradient 9, mild AI, mild BAE, PASP 39.  Admitted for a/c diastolic CHF 01/2013 and then seen in the ED in 04/2013 with volume excess.    Admitted 04/2013 with worsening anemia. She was transfused with PRBCs. EGD demonstrated GAVE which was treated with APC.  Of note, atenolol was changed to a non-selective beta blocker due to better reduction in portal pressures.    Last seen by me 05/2013.  She remained dyspneic which I felt was multifactorial (anemia, diastolic CHF, COPD, deconditioning).  I adjusted her Lasix.    Since last seen, she has fallen and broken ribs. She still has some pain on the right side. She had developed a cough recently and her [facility] nurse notified her PCP. She was placed on antibiotics to cover for bronchitis. She's had low-grade fevers, < 100F. She denies hemoptysis. She otherwise denies substernal chest pain. She remains dyspneic. She describes NYHA class III symptoms. She sleeps on 3 pillows chronically. She denies PND. LE edema is improved. She denies syncope. She does get dizzy when her sugar is elevated.  Recent Labs: 02/19/2013: TSH 1.541  06/06/2013: ALT 56*; Pro B Natriuretic peptide (BNP) 217.7*  06/25/2013: Creatinine 2.0*; Potassium 4.6  08/05/2013: Hemoglobin 7.7*    Wt Readings from Last 3 Encounters:  08/09/13 223 lb 12.8  oz (101.515 kg)  07/01/13 222 lb 3.2 oz (100.789 kg)  06/29/13 227 lb 8 oz (103.193 kg)     Past Medical History  Diagnosis Date  . Chronic airway obstruction, not elsewhere classified   . Unspecified chronic bronchitis   . Unspecified essential hypertension   . Coronary atherosclerosis of unspecified type of vessel, native or graft   . Palpitations   . Aortic valve disorders   . Pure hypercholesterolemia   . Morbid obesity   . Esophageal reflux   . Angiodysplasia of intestine (without mention of hemorrhage)   . Diverticulosis of colon (without mention of hemorrhage)   . Benign neoplasm of colon   . Autoimmune hepatitis   . Cystitis, unspecified   . Osteoarthrosis, unspecified whether generalized or localized, unspecified site   . Osteoporosis, unspecified   . Restless legs syndrome (RLS)   . Major depressive disorder, recurrent episode, severe, specified as with psychotic behavior   . Iron deficiency anemia secondary to blood loss (chronic)   . Coarse tremors     "just in my arms"  . Carpal tunnel syndrome on both sides   . Dysrhythmia     palpitations  . CHF (congestive heart failure)   . Asthma   . Shortness of breath     "any time really"  . Sleep apnea     "but I don't use the equipment"  . Type II diabetes mellitus   . Blood transfusion   . H/O hiatal hernia   .  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 Adenomatous colon polyp   . Anginal pain   . Anxiety   . Pneumonia   . Heart murmur   . AVM (arteriovenous malformation)   . Portal hypertensive gastropathy   . Falls frequently   . UTI (lower urinary tract infection)   . Hiatal hernia   . GAVE (gastric antral vascular ectasia)     Current Outpatient Prescriptions  Medication Sig Dispense Refill  . albuterol (PROAIR HFA) 108 (90 BASE) MCG/ACT inhaler Inhale 2 puffs into the lungs every 6 (six) hours as needed for wheezing or shortness of breath.       . ALPRAZolam (XANAX) 1 MG tablet Take 1 mg by mouth 3  (three) times daily as needed for anxiety.       . AMBULATORY NON FORMULARY MEDICATION Continuous O2 @@ 3LMP      . b complex vitamins tablet Take 1 tablet by mouth daily.       . B-D ULTRAFINE III SHORT PEN 31G X 8 MM MISC USE AS DIRECTED  100 each  3  . buPROPion (WELLBUTRIN XL) 150 MG 24 hr tablet Take 150 mg by mouth daily.      . cefdinir (OMNICEF) 300 MG capsule Take 1 capsule (300 mg total) by mouth 2 (two) times daily.  20 capsule  0  . desvenlafaxine (PRISTIQ) 100 MG 24 hr tablet Take 150 mg by mouth daily.      [name] dextromethorphan (DELSYM) 30 MG/5ML liquid Take 60 mg by mouth as needed for cough.      . Fluticasone-Salmeterol (ADVAIR DISKUS) 250-50 MCG/DOSE AEPB INHALE 1 PUFF INTO THE LUNGS 2 (TWO) TIMES DAILY.  14 each  0  . FREESTYLE LITE test strip TEST BLOOD SUGAR 3 TIMES DAILY  50 each  6  . furosemide (LASIX) 20 MG tablet Take 40 mg by mouth 2 (two) times daily.       [name] HYDROcodone-acetaminophen (NORCO/VICODIN) 5-325 MG per tablet Take 1 tablet by mouth 3 (three) times daily as needed for moderate pain.  90 tablet  0  . Insulin Glargine (LANTUS SOLOSTAR [place]) Inject 150 Units into the skin daily before supper.       . insulin lispro (HUMALOG KWIKPEN) 100 UNIT/ML SOPN Inject 10 Units into the skin daily with supper.  5 pen  11  . lidocaine (LIDODERM) 5 % Place 1 patch onto the skin daily. Remove & Discard patch within 12 hours or as directed by MD  30 patch  5  . lisinopril (PRINIVIL,ZESTRIL) 5 MG tablet Take 5 mg by mouth at bedtime.       . methylPREDNIsolone (MEDROL DOSPACK) 4 MG tablet Take as directed  21 tablet  0  . Multiple Vitamins-Minerals (CENTRUM SILVER PO) Take by mouth daily.      . nadolol (CORGARD) 20 MG tablet Take 1 tablet (20 mg total) by mouth daily.  30 tablet  5  . pantoprazole (PROTONIX) 40 MG tablet Take 1 tablet (40 mg total) by mouth 2 (two) times daily.  60 tablet  11  . polyethylene glycol (MIRALAX / GLYCOLAX) packet Take 17 g by mouth daily as needed  (constipation).      . potassium chloride SA (K-DUR,KLOR-CON) 10 MEQ tablet Take 1 tablet (10 mEq total) by mouth every other day.  30 tablet  1  . promethazine (PHENERGAN) 25 MG tablet Take 1 tablet (25 mg total) by mouth 2 (two) times daily as needed for  nausea.  30 tablet  5  . QUEtiapine (SEROQUEL) 100 MG tablet Take 400 mg by mouth at bedtime.       [name] rOPINIRole (REQUIP) 4 MG tablet Take 4 mg by mouth at bedtime.      . Saline (OCEAN NASAL SPRAY NA) Place 1 spray into the nose daily as needed (congestion).      . Simethicone (GAS-X PO) Take 1 tablet by mouth daily as needed (flatulence).       . simvastatin (ZOCOR) 40 MG tablet TAKE 1 TABLET BY MOUTH AT BEDTIME FOR CHOLESTEROL  30 tablet  6  . sucralfate (CARAFATE) 1 GM/10ML suspension Take 10 mL (1 g total) by mouth 2 (two) times daily.  420 mL  1  . Vitamin D, Ergocalciferol, (DRISDOL) 50000 UNITS CAPS capsule Take 50,000 Units by mouth every 7 (seven) days. On fridays       No current facility-administered medications for this visit.    Allergies:    Allergies  Allergen Reactions  . Aspirin Other (See Comments)    Causes nosebleeds  . Penicillins Itching  . Theophylline Nausea And Vomiting    Social History:  The patient  reports that she quit smoking about 20 years ago. Her smoking use included Cigarettes. She has a 37.5 pack-year smoking history. She has never used smokeless tobacco. She reports that she does not drink alcohol or use illicit drugs.   ROS:  Please see the history of present illness.   She notes occasional blood in her stools. This is chronic without significant change.  All other systems reviewed and negative.   PHYSICAL EXAM: VS:  BP 144/62  Pulse 46  Ht 5\' 4"  (1.626 m)  Wt 223 lb 12.8 oz (101.515 kg)  BMI 38.40 kg/m2 Well nourished, well developed, in no acute distress HEENT: normal Neck: no JVD   Cardiac:  normal S1, S2; RRR; 2/6 systolic murmur at the RUSB Lungs:  Decreased breath sounds bilaterally,  no wheezing, rhonchi or rales Abd: distended, nontender, no hepatomegaly Ext: no edema Skin: warm and dry Neuro:  CNs 2-12 intact, no focal abnormalities noted  EKG:   Sinus, HR 47, LAD, nonspecific ST-T wave changes, first degree AV block with PR interval 242 ms   ASSESSMENT AND PLAN:  1. Dyspnea:  This is chronic and multifactorial and related to anemia, diastolic CHF, COPD, deconditioning.  2. Chronic Diastolic CHF:  Volume appears stable. Check follow up basic metabolic panel today to follow renal function and potassium. 3. Hypertension:  Controlled. 4. Bradycardia: Question if she is symptomatic from this. I will decrease her nadolol to 10 mg daily. 5. CAD:  No angina. Continue statin. 6. Aortic Stenosis:  Very mild by recent echocardiogram. Continue clinical follow up. 7. COPD:  Continue followup with primary care. 8. Anemia:  From chronic GI blood loss.  She is now receiving Aranesp injections. Recent hemoglobin was in the 7 range.  She is followed by Dr. [name] with GI as planned.   9. Disposition:  F/u with Dr. Excell Seltzer in 4 months.   Signed, Tereso Newcomer, PA-C  08/09/2013 11:52 AM

## 2013-08-23 ENCOUNTER — Encounter (HOSPITAL_COMMUNITY)
Admission: RE | Admit: 2013-08-23 | Discharge: 2013-08-23 | Disposition: A | Discharge status: Home / Self Care | Payer: Medicare Other | Source: Ambulatory Visit | Attending: Pulmonary Disease | Admitting: Pulmonary Disease

## 2013-08-23 LAB — POCT HEMOGLOBIN-HEMACUE: Hemoglobin: 8.1 g/dL — ABNORMAL LOW (ref 12.0–15.0)

## 2013-08-23 MED ORDER — DARBEPOETIN ALFA-POLYSORBATE 200 MCG/0.4ML IJ SOLN
200.0000 ug | INTRAMUSCULAR | Status: DC
  Administered 2013-08-23: 14:00:00 200 ug via SUBCUTANEOUS

## 2013-08-23 MED ORDER — DARBEPOETIN ALFA-POLYSORBATE 200 MCG/0.4ML IJ SOLN
INTRAMUSCULAR | Status: AC
  Filled 2013-08-23: qty 0.4

## 2013-08-26 DIAGNOSIS — J189 Pneumonia, unspecified organism: Secondary | ICD-10-CM

## 2013-08-26 HISTORY — DX: Pneumonia, unspecified organism: J18.9

## 2013-08-30 ENCOUNTER — Telehealth: Payer: Self-pay | Admitting: Pulmonary Disease

## 2013-08-30 ENCOUNTER — Ambulatory Visit (INDEPENDENT_AMBULATORY_CARE_PROVIDER_SITE_OTHER): Payer: Medicare Other | Admitting: Endocrinology

## 2013-08-30 VITALS — BP 122/82 | HR 56 | Temp 98.6°F | Ht 64.0 in | Wt 214.0 lb

## 2013-08-30 DIAGNOSIS — E1049 Type 1 diabetes mellitus with other diabetic neurological complication: Secondary | ICD-10-CM

## 2013-08-30 NOTE — Patient Instructions (Addendum)
check your blood sugar twice a day.  vary the time of day when you check, between before the 3 meals, and at bedtime.  also check if you have symptoms of your blood sugar being too high or too low.  please keep a record of the readings and bring it to your next appointment here.  please call us sooner if your blood sugar goes below 70, or if you have a lot of readings over 200.  blood tests are being requested for you today.  We'll contact you with results.   Please reduce the lantus to 140 units each evening, and: Also, please increase the humalog to 20 units with the evening meal.  Please come back for a follow-up appointment in 3 months.

## 2013-08-30 NOTE — Progress Notes (Signed)
Subjective:    Patient ID: [name], female    DOB: 02/27/1941, 73 y.o.   MRN: [id number]  HPI pt returns for f/u of insulin-requiring DM (dx'ed 2004, on a routine blood test; she has moderate neuropathy of the lower extremities, and associated CAD; she has been on insulin since 2010; she needs a simple insulin regimen; she has never had severe hypoglycemia or DKA).  no cbg record, but states cbg's vary from 70-350.  It is lowest in the afternoon, and highest in am (she does not check at hs).   Past Medical History  Diagnosis Date  . Chronic airway obstruction, not elsewhere classified   . Unspecified chronic bronchitis   . Unspecified essential hypertension   . Coronary atherosclerosis of unspecified type of vessel, native or graft   . Palpitations   . Aortic valve disorders   . Pure hypercholesterolemia   . Morbid obesity   . Esophageal reflux   . Angiodysplasia of intestine (without mention of hemorrhage)   . Diverticulosis of colon (without mention of hemorrhage)   . Benign neoplasm of colon   . Autoimmune hepatitis   . Cystitis, unspecified   . Osteoarthrosis, unspecified whether generalized or localized, unspecified site   . Osteoporosis, unspecified   . Restless legs syndrome (RLS)   . Major depressive disorder, recurrent episode, severe, specified as with psychotic behavior   . Iron deficiency anemia secondary to blood loss (chronic)   . Coarse tremors     "just in my arms"  . Carpal tunnel syndrome on both sides   . Dysrhythmia     palpitations  . CHF (congestive heart failure)   . Asthma   . Shortness of breath     "any time really"  . Sleep apnea     "but I don't use the equipment"  . Type II diabetes mellitus   . Blood transfusion   . H/O hiatal hernia   . 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 Adenomatous colon polyp   . Anginal pain   . Anxiety   . Pneumonia   . Heart murmur   . AVM (arteriovenous malformation)   . Portal hypertensive gastropathy    . Falls frequently   . UTI (lower urinary tract infection)   . Hiatal hernia   . GAVE (gastric antral vascular ectasia)     Past Surgical History  Procedure Laterality Date  . Hemorroidectomy    . Appendectomy    . Vaginal hysterectomy    . Breast biopsy      bilateral  . Cesarean section      x 3  . Appendectomy    . Esophagogastroduodenoscopy  06/12/2012    Procedure: ESOPHAGOGASTRODUODENOSCOPY (EGD);  Surgeon: Lafayette Dragon, MD;  Location: [facility];  Service: Endoscopy;  Laterality: N/A;  . Balloon dilation  06/12/2012    Procedure: BALLOON DILATION;  Surgeon: Lafayette Dragon, MD;  Location: [facility];  Service: Endoscopy;  Laterality: N/A;  ?balloon  . Esophagogastroduodenoscopy  09/10/2012    Procedure: ESOPHAGOGASTRODUODENOSCOPY (EGD);  Surgeon: Lafayette Dragon, MD;  Location: [facility];  Service: Endoscopy;  Laterality: N/A;  . Hot hemostasis  09/10/2012    Procedure: HOT HEMOSTASIS (ARGON PLASMA COAGULATION/BICAP);  Surgeon: Lafayette Dragon, MD;  Location: [facility];  Service: Endoscopy;  Laterality: N/A;  . Esophagogastroduodenoscopy N/A 01/18/2013    Procedure: ESOPHAGOGASTRODUODENOSCOPY (EGD);  Surgeon: Lafayette Dragon, MD;  Location: [facility];  Service: Endoscopy;  Laterality: N/A;  .  Esophagogastroduodenoscopy N/A 05/24/2013    Procedure: ESOPHAGOGASTRODUODENOSCOPY (EGD);  Surgeon: Jerene Bears, MD;  Location: [facility];  Service: Gastroenterology;  Laterality: N/A;  . Hot hemostasis N/A 05/24/2013    Procedure: HOT HEMOSTASIS (ARGON PLASMA COAGULATION/BICAP);  Surgeon: Jerene Bears, MD;  Location: [facility];  Service: Gastroenterology;  Laterality: N/A;    History   Social History  . Marital Status: Married    Spouse Name: [name]    Number of Children: 4  . Years of Education: 12   Occupational History  . Disabled   .     Social History Main Topics  . Smoking status: Former Smoker -- 1.50 packs/day for 25 years    Types: Cigarettes    Quit  date: 08/26/1992  . Smokeless tobacco: Never Used  . Alcohol Use: No     Comment: 9/48/54 "last alcoholic drink was years ago"  . Drug Use: No  . Sexual Activity: No   Other Topics Concern  . Not on file   Social History Narrative   Patient lives at home with her husband [name]).    Retired.   Caffeine- None    Right handed.    Current Outpatient Prescriptions on File Prior to Visit  Medication Sig Dispense Refill  . albuterol (PROAIR HFA) 108 (90 BASE) MCG/ACT inhaler Inhale 2 puffs into the lungs every 6 (six) hours as needed for wheezing or shortness of breath.       . ALPRAZolam (XANAX) 1 MG tablet Take 1 mg by mouth 3 (three) times daily as needed for anxiety.       . AMBULATORY NON FORMULARY MEDICATION Continuous O2 @@ 3LMP      . b complex vitamins tablet Take 1 tablet by mouth daily.       . B-D ULTRAFINE III SHORT PEN 31G X 8 MM MISC USE AS DIRECTED  100 each  3  . buPROPion (WELLBUTRIN XL) 150 MG 24 hr tablet Take 150 mg by mouth daily.      [name] desvenlafaxine (PRISTIQ) 100 MG 24 hr tablet Take 150 mg by mouth daily.      [name] dextromethorphan (DELSYM) 30 MG/5ML liquid Take 60 mg by mouth as needed for cough.      . Fluticasone-Salmeterol (ADVAIR DISKUS) 250-50 MCG/DOSE AEPB INHALE 1 PUFF INTO THE LUNGS 2 (TWO) TIMES DAILY.  14 each  0  . FREESTYLE LITE test strip TEST BLOOD SUGAR 3 TIMES DAILY  50 each  6  . furosemide (LASIX) 20 MG tablet Take 40 mg by mouth 2 (two) times daily.       . Insulin Glargine (LANTUS SOLOSTAR [place]) Inject 140 Units into the skin daily before supper.       . lidocaine (LIDODERM) 5 % Place 1 patch onto the skin daily. Remove & Discard patch within 12 hours or as directed by MD  30 patch  5  . lisinopril (PRINIVIL,ZESTRIL) 5 MG tablet Take 5 mg by mouth at bedtime.       . methylPREDNIsolone (MEDROL DOSPACK) 4 MG tablet Take as directed  21 tablet  0  . Multiple Vitamins-Minerals (CENTRUM SILVER PO) Take by mouth daily.      . nadolol (CORGARD) 20 MG  tablet Take 0.5 tablets (10 mg total) by mouth daily.      . pantoprazole (PROTONIX) 40 MG tablet Take 1 tablet (40 mg total) by mouth 2 (two) times daily.  60 tablet  11  . polyethylene glycol (MIRALAX / GLYCOLAX)  packet Take 17 g by mouth daily as needed (constipation).      . potassium chloride SA (K-DUR,KLOR-CON) 10 MEQ tablet Take 1 tablet (10 mEq total) by mouth every other day.  30 tablet  1  . promethazine (PHENERGAN) 25 MG tablet Take 1 tablet (25 mg total) by mouth 2 (two) times daily as needed for nausea.  30 tablet  5  . QUEtiapine (SEROQUEL) 100 MG tablet Take 400 mg by mouth at bedtime.       [name] rOPINIRole (REQUIP) 4 MG tablet Take 4 mg by mouth at bedtime.      . Saline (OCEAN NASAL SPRAY NA) Place 1 spray into the nose daily as needed (congestion).      . Simethicone (GAS-X PO) Take 1 tablet by mouth daily as needed (flatulence).       . simvastatin (ZOCOR) 40 MG tablet TAKE 1 TABLET BY MOUTH AT BEDTIME FOR CHOLESTEROL  30 tablet  6  . sucralfate (CARAFATE) 1 GM/10ML suspension Take 10 mL (1 g total) by mouth 2 (two) times daily.  420 mL  1  . Vitamin D, Ergocalciferol, (DRISDOL) 50000 UNITS CAPS capsule Take 50,000 Units by mouth every 7 (seven) days. On fridays      . HYDROcodone-acetaminophen (NORCO/VICODIN) 5-325 MG per tablet Take 1 tablet by mouth 3 (three) times daily as needed for moderate pain.  90 tablet  0   No current facility-administered medications on file prior to visit.    Allergies  Allergen Reactions  . Aspirin Other (See Comments)    Causes nosebleeds  . Penicillins Itching  . Theophylline Nausea And Vomiting    Family History  Problem Relation Age of Onset  . Heart disease Mother   . Cervical cancer Mother   . Kidney disease Mother   . Diabetes Mother   . Breast cancer Sister   . Multiple sclerosis Sister   . Colon cancer Neg Hx   . Breast cancer Maternal Aunt     x 2  . Diabetes Father   . Diabetes Sister   . Multiple sclerosis Other    BP  122/82  Pulse 56  Temp(Src) 98.6 F (37 C) (Oral)  Ht 5\' 4"  (1.626 m)  Wt 214 lb (97.07 kg)  BMI 36.72 kg/m2  SpO2 91%  Review of Systems denies hypoglycemia and weight change.    Objective:   Physical Exam VITAL SIGNS:  See vs page GENERAL: no distress.  In wheelchair. Morbid obesity.   Lab Results  Component Value Date   HGBA1C 6.5 08/30/2013      Assessment & Plan:  DM: overcontrolled, given this regimen, which does match insulin to her changing needs throughout the day CAD: in this setting, pt should avoid hypoglycemia.  COPD: this limits exercise rx of DM

## 2013-08-30 NOTE — Telephone Encounter (Signed)
Pt's husband is aware that samples are ready to be picked up.

## 2013-08-31 LAB — MICROALBUMIN / CREATININE URINE RATIO
Creatinine,U: 45.3 mg/dL
Microalb Creat Ratio: 1.8 mg/g (ref 0.0–30.0)
Microalb, Ur: 0.8 mg/dL (ref 0.0–1.9)

## 2013-08-31 LAB — HEMOGLOBIN A1C: Hgb A1c MFr Bld: 6.5 % (ref 4.6–6.5)

## 2013-09-02 ENCOUNTER — Telehealth: Payer: Self-pay | Admitting: Pulmonary Disease

## 2013-09-02 ENCOUNTER — Ambulatory Visit: Payer: Medicare Other | Admitting: Pulmonary Disease

## 2013-09-02 MED ORDER — ONETOUCH ULTRASOFT LANCETS MISC: Status: DC

## 2013-09-02 MED ORDER — ONETOUCH ULTRA MINI W/DEVICE KIT: PACK | Status: DC

## 2013-09-02 MED ORDER — GLUCOSE BLOOD VI STRP: ORAL_STRIP | Status: DC

## 2013-09-02 NOTE — Telephone Encounter (Signed)
Called and spoke with pt and she stated that she dropped off forms for her to get the DM supplies and new machine---this is what her insurance will cover.  i have sent in new DM machine to her pharmacy.  Nothing further is needed.

## 2013-09-06 ENCOUNTER — Encounter (HOSPITAL_COMMUNITY)
Admission: RE | Admit: 2013-09-06 | Discharge: 2013-09-06 | Disposition: A | Discharge status: Home / Self Care | Payer: Medicare Other | Source: Ambulatory Visit | Attending: Pulmonary Disease | Admitting: Pulmonary Disease

## 2013-09-06 DIAGNOSIS — D509 Iron deficiency anemia, unspecified: Secondary | ICD-10-CM | POA: Diagnosis present

## 2013-09-06 LAB — POCT HEMOGLOBIN-HEMACUE: Hemoglobin: 8 g/dL — ABNORMAL LOW (ref 12.0–15.0)

## 2013-09-06 MED ORDER — DARBEPOETIN ALFA-POLYSORBATE 200 MCG/0.4ML IJ SOLN
200.0000 ug | INTRAMUSCULAR | Status: DC
  Administered 2013-09-06: 14:00:00 200 ug via SUBCUTANEOUS

## 2013-09-06 MED ORDER — DARBEPOETIN ALFA-POLYSORBATE 200 MCG/0.4ML IJ SOLN
INTRAMUSCULAR | Status: AC
  Filled 2013-09-06: qty 0.4

## 2013-09-09 ENCOUNTER — Ambulatory Visit: Payer: Medicare Other | Admitting: Pulmonary Disease

## 2013-09-13 ENCOUNTER — Telehealth: Payer: Self-pay

## 2013-09-13 NOTE — Telephone Encounter (Signed)
Pt called stating that her blood sugars have been in the 400s for the past few days. Pt confirmed that she is still taking 140 units of Lantus and 20 units of Humalog. Please advise, Thanks!

## 2013-09-13 NOTE — Telephone Encounter (Signed)
Please increase humalog to 30 units daily, with the evening meal.  If this does not help, call back and tell us what time of day the blood sugar is high.

## 2013-09-14 NOTE — Telephone Encounter (Signed)
Pt informed and instructed to keep office informed.

## 2013-09-17 ENCOUNTER — Telehealth: Payer: Self-pay | Admitting: Pulmonary Disease

## 2013-09-17 ENCOUNTER — Telehealth: Payer: Self-pay

## 2013-09-17 ENCOUNTER — Ambulatory Visit (INDEPENDENT_AMBULATORY_CARE_PROVIDER_SITE_OTHER): Payer: Medicare Other | Admitting: Adult Health

## 2013-09-17 ENCOUNTER — Encounter: Payer: Self-pay | Admitting: Adult Health

## 2013-09-17 ENCOUNTER — Other Ambulatory Visit (INDEPENDENT_AMBULATORY_CARE_PROVIDER_SITE_OTHER): Payer: Medicare Other

## 2013-09-17 ENCOUNTER — Ambulatory Visit (INDEPENDENT_AMBULATORY_CARE_PROVIDER_SITE_OTHER)
Admission: RE | Admit: 2013-09-17 | Discharge: 2013-09-17 | Disposition: A | Discharge status: Home / Self Care | Payer: Medicare Other | Source: Ambulatory Visit | Attending: Adult Health | Admitting: Adult Health

## 2013-09-17 VITALS — BP 144/84 | HR 60 | Temp 99.3°F | Ht 64.0 in | Wt 231.0 lb

## 2013-09-17 DIAGNOSIS — R0609 Other forms of dyspnea: Secondary | ICD-10-CM

## 2013-09-17 DIAGNOSIS — R0989 Other specified symptoms and signs involving the circulatory and respiratory systems: Secondary | ICD-10-CM

## 2013-09-17 DIAGNOSIS — R0600 Dyspnea, unspecified: Secondary | ICD-10-CM

## 2013-09-17 DIAGNOSIS — J441 Chronic obstructive pulmonary disease with (acute) exacerbation: Secondary | ICD-10-CM

## 2013-09-17 LAB — BASIC METABOLIC PANEL
BUN: 45 mg/dL — ABNORMAL HIGH (ref 6–23)
CALCIUM: 8.7 mg/dL (ref 8.4–10.5)
CO2: 30 meq/L (ref 19–32)
Chloride: 98 mEq/L (ref 96–112)
Creatinine, Ser: 1.6 mg/dL — ABNORMAL HIGH (ref 0.4–1.2)
GFR: 34.1 mL/min — ABNORMAL LOW (ref >60.00)
Glucose, Bld: 202 mg/dL — ABNORMAL HIGH (ref 70–99)
POTASSIUM: 4.5 meq/L (ref 3.5–5.1)
SODIUM: 135 meq/L (ref 135–145)

## 2013-09-17 LAB — BRAIN NATRIURETIC PEPTIDE: Pro B Natriuretic peptide (BNP): 147 pg/mL — ABNORMAL HIGH (ref 0.0–100.0)

## 2013-09-17 MED ORDER — DOXYCYCLINE HYCLATE 100 MG PO TABS: 100.0000 mg | ORAL_TABLET | Freq: Two times a day (BID) | ORAL | Status: DC

## 2013-09-17 NOTE — Progress Notes (Signed)
Subjective:    Patient ID: [name], female    DOB: 06/23/1941, 73 y.o.   MRN: [id number]  HPI 73 y/o WF with multiple med prob.   09/17/2013 Acute OV  Complains of increased SOB, wheezing, occ cough with thick green mucus, chest tightness, x5days.  Denies f/c/s, hemoptysis, nausea, vomiting, or diarrhea. Appetite is good.  Wt is up 6 lbs with some ankle edema.  Cough is worse for last 2 days with thick mucus.  No recent travel or abx use.  Taking mucinex without much help.  Has multiple drug allergies            Problem List:  COPD (ICD-496) & BRONCHITIS, RECURRENT (ICD-491.9) - Ex-smoker w/ underlying COPD on home O2, ADVAIR 250Bid, ALBUTEROL (via nebs or MDI for Prn use), & MUCINEX 1-2Bid... she has combined obstructive AND restrictive disease (based on her obesity) w/ diet (weight reduction) + exercise strongly recommended to the pt...  ~  CTChest 5/06 = neg. ~  baseline CXR w/ bilat LL scarring, NAD.... last CXR 3/09 = unchanged, NAD. ~  PFTs 3/09 showed FVC= 2.47 (83%), FEV1= 1.65 (69%), %1sec= 67, mid-flows= 28%pred. ~  CXR 4/12 showed bilat scarring, NAD... ~  PFT 10/12 showed FVC= 2.28 (77%), FEV1= 1.46 (64%), %1sec= 64, mid-flows= 31% pred. ~  CXR 3/13 showed normal heart size, prom hilar regions, no infiltrates etc, NAD.[name]. ~  CTAngio Chest 3/13 showed neg for PE- scarring at lung bases, no adenopathy or lung lesions, calcified coronaries, multilevel spondylosis in TSpine, cirrhosis in liver ~  CXR 2/14 showed cardiomeg, no pulm edema, clear lungs, NAD ~  PFT 5/14 showed FVC=2.41 (83%), FEV1=1.77 (80%), %1sec=74, mid-flows= 61% predicted (all c/w min obstructive dis, +superimposed restriction) ~  Ambulatory O2 sats 5/14 showed 93% sat on RA at rest w/ HR=60; 87% sat after one lap on RA w/ HR=62 ~  7/14: on Home O2, Advair250, NEBS, Mucinex; remote smoker quit yrs ago; known obstructive & restrictive lung dis; CXR 6/14 showed mild cardiomeg, mild vasc congestion, linear  atx at left base, otherw clear; CTA 3/13 was neg for PE & showed some atelec, coronary calcif, multilevel spondylosis, & cirrhosis of the liver; she continues on O2 by [place] at 3L/min...  ~  CXR 7/14 & 9/14 showed norm heart size, clear lungs, no edema etc..   HYPERTENSION (ICD-401.9) >>  ~  on ATENOLOL 61m/d,  LASIX 480md & K20/d...  ~  10/11:  BP=116/78 and she has mult somatic complaints but all are diminished compared to prev visits; denies bad HA's, CP, palpit, syncope, edema, etc... ~  4/12:  BP= 140/60 and she persists w/ mult somatic complaints... ~  10/12:  BP= 122/74 & she has mult somatic complaints; needs better low sodium, calorie restricted diet & wt reduction... ~  3/13:  BP= 120/72 & she is unchanged symptomatically;  EKG 3/13 showed NSR, rate68, LAD, late transition, NSSTTWA... ~  7/13:  BP= 110/62 & she persists w/ mult somatic complaints... ~  10/13:  BP= 118/60 & she remains clinically stable... ~  7/14: on Aten50, Lisinopril5, Lasix20-3/d, K10Qod; BP= 130/70 & she has mult somatic complaints but no specific CP, palpit, ch in SOB or edema; continue low sodium & wt reduction. ~  7/14: DrEllison stopped her Aten50; f/u BP on Lisin5, Lasix60, & K10Qod = 122/72... ~  9/14:  BP= 128/68 & she is improved...  CAD (ICD-414.00) [facility]... cath showed non-obstructive CAD... no CP, palpit,  etc (but she is way too sedentary). ~  cath 4/09 by DrCooper showed normLMAIN, 20% midLAD, norm CIRC, 20-30% proxRCA, EF= 50-55%... ~  EKG 1/11 showed SBrady, rate54, ?septal infarct, NSSTTWA, NAD... ~  EKG 3/13 showed NSR, rate68, LAD, late transition, NSSTTWA... ~  CTAngio Chest 3/13 showed calcif coronaries as noted... ~  DrCooper 7/13> stable, no angina, too sedentary; f/u 2DEcho w/ mild LVH, norm LVF w/ EF=55-60% & norm wall motion, mils AS & AI, Gr2DD... ~  9/14: she had f/u DrCooper w/ meds adjusted> Aten changed to Corgard, Lasix adjusted...  PALPITATIONS (FYB-017.5) >>   DIASTOLIC CHF >>  ~  eval by DrKlein w/ 7 beats WT on monitor... he changed Lisinopril to BBlocker (currently ATENOLOL 72m/d) and improved... ~  she saw DrCopper for Cards f/u 1/11- palpit well controlled on Atenolol; hx non-obstructive CAD & good LVF; no change in Rx. ~  [facility] Diastolic CH[facility] and Lasix adjusted- Disch on Lasix20-3/d  AORTIC STENOSIS (ICD-424.1) - she has mild Ao Valve disease w/ AS/ AI... followed by DrCooper. ~  2DEcho 11/06 showed mild ca++ AoV w/ mild AS, norm LVF w/ EF=55-65% ~  repeat 2DEcho 1/09 showed similar mild ca++ AoV w/ mild AS, mild AI, no regional wall motion abn, norm LVf w/ EF= 60%... NOTE: est PAsys= 35... ~  repeat 2DEcho 1/10 showed norm LVF w/ EF= 55-60%, no regional wall motion abn, mild DD, mod calcif AoV & mild reduced leaflet excursion & mild AI... ~  Repeat 2DEcho 7/13 showed norm LVF w/ EF=55-65%, no regional wall motion abn, Gr2DD, mildAS, mildAI, mild LAdil... ~  DrCooper 7/13> stable, no angina, too sedentary; f/u 2DEcho w/ mild LVH, norm LVF w/ EF=55-60% & norm wall motion, mils AS & AI, Gr2DD... ~  7/14: known non-obstructive CAD; hx atypCP/ CWP; no angina & she remains way too sedentary; discussed risk factor reduction strategy & she saw DrCooper 5/14> stable, no angina, too sedentary; last 2DEcho 7/13 w/ mild LVH, norm LVF w/ EF=55-60% & norm wall motion, mild AS&AI, Gr2DD;  EKG 5/14 w/ NSR, rate62, 1st degree AVB, NSSTTWA...  HYPERCHOLESTEROLEMIA (ICD-272.0) - on SIMVASTATIN 420md... ~  FLHuntleigh/09 showed TChol 107, TG 69, HDL 35, LDL 59 ~  FLP 3/10 showed TChol 128, TG 125, HDL 33, LDL 70 ~  FLP 12/10 showed TChol 98, TG 64, HDL 37, LDL 48... continue same. ~  FLP 10/11 showed TChol 124, TG 105, HDL 37, LDL 66 ~  FLP 4/12 showed TChol 122, TG 95, HDL 37, LDL 66 ~  FLP 10/12 on Simva40 showed TChol 110, TG 116, HDL 34, LDL 53 ~  FLP 5/13 on Simva40 showed TChol 94, TG 130, HDL 32, LDL 36  DM (ICD-250.00) - hx irreg control, not really  on diet... on LANTUS per drEllison,  & odd METFORMIN, GLIMEPIRIDE, JANUVIA...  ~  labs 10/08 BS=198, HgA1C=7.2 ~  labs 1/09 showed BS=119, HgA1c=6.0 ~  labs 6/09 showed BS= 248, HgA1c= 7.0... Marland Kitcheniscussed need for starting insulin if she can't get on track and get wt down... ~  labs 9/09 showed BS= 236, HgA1c= 7.6...Marland KitchenMarland Kitchenec- start Lantus 5u/d... grad increased to 10u. ~  labs 11/09 showed BS= 166, HgA1c= 6.0...Marland KitchenMarland Kitchenec- great! keep same. ~  labs 3/10 showed BS= 169, A1c= 6.8 ~  labs 12/10 showed BS= 149, A1c= 5.8, Umicroalb= neg. ~  labs 10/11 showed BS= 215, A1c= 7.1...Marland Kitchenrec> better diet, get wt down. ~  Labs 4/12 showed BS= 172, A1c=5.8 ~  Labs 10/12 showed BS= 245, A1c= 8.1.Marland KitchenMarland Kitchen rec to take meds every day; incr Lantus dose 10==> 20u/d... ~  Labs 3/13 on Lantus20+31mds showed BS= 276, A1c= 6.8 (improved)... ~  4[facility]13:  [facility] ~  Labs 5/13 on Lantus20+340ms showed BS= 191, A1c= 6.9 ==> subsequently BS incr & Lantus incr ~  Labs 7/13 on Lantus40+56m77m showed BS= 339, A1c= 7.4... Marland KitchenMarland Kitchenhy control is off, rec move shots around & incr by 5u/wk til BS<200. ~  Labs 10/13 on Lantus25+56me26mshowed BS= 164, A1c= 5.0... TMarland KitchenMarland Kitchen tight & rec to decr Lantus20 & decr Glimep4mg 26m1/2 tab... ~  She has been adjusting her meds on her own in the interval => referred to DrEllRochelleocrinology... ~  7/14: on Lantus20u & off all DM pills per DrEllison; needs much better diet & exercise program! Not checking BS at home; labs from 6/14 showed BS=169, A1c=6.7 & she will continue f/u w/ Endocrinology. ~  7/14 f/u DrEllison & stopped all DM pills and increased her insulin to Lantus50Bid ~  9/14:  DrEllison increased her Lantus to 200u/d...  MORBID OBESITY (ICD-278.01) - not really on diet or exercise program... ~  weight 6/09 = 223# ~  weight 11/09 = 228# ~  weight 3/10 = 225# ~  weight 6/10 = 217# ~  weight 9/10 = 213# ~  weight 12/10 = 224# ~  weight 2/11 = 216# ~  weight  10/11 = 230# ~  Weight 4/12 = 222# ~  Weight 10/12 = 225# ~  Weight 3/13 = 215#...  great job, keep it up... 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  ~  EGD  2/06 showed angiodysplasia & mult gastric pseudopolyps, watermelon stomach improved from 2003. ~  recent f/u EGD 9/09 showed chr gastritis, no watermelon stomach & improved from prev... ~  recurrent anemia 12/10 w/ f/u EGD 2/11 showing gastric antral avm's- s/p argon laser ablation, no varicies etc... 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 ~  5/13:  She is not on PPI and c/o some abd discomfort- rec starting PROTONIX 40mg/24m ~  She saw DrDBrodie 10/13 w/ c/o dysphagia + f/u of her autoimmune liver dis, portal HTN, cirrhosis, "watermelon stomach" & hx argon laser ablation of gastric AVMs (chr GI blood loss w/ periodic Fe infusions- last 8/13); EGD 10/13 showed norm esoph mucosa, no varicies, spasm at the LES but no stricture found; dilated to 62F; mod severe portal hypertensive gastropathy in stomach, no active bleeding; REC> continue PPI, try Levsin... ~  1/14:  She had EGD & Argon Laser ablation of gastric AVM by DrDBrodie... 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 ~  9/14:  She was Hosp by [facility] & seen by GI- drPyrtle w/ EGD (no varices,  Croom, mild gastropathy,  area of GAVE treated w/ APC, nodular mucosa (inflammed mucosa, neg HPylori, no dysplasia)...  DIVERTICULOSIS OF COLON (ICD-562.10) & POLYP, COLON (ICD-211.3) -  ~  last colonoscopy 10/08 w/ adenomatous polyp removed, and angiodysplasia without hemorrhage ~  CT Abd 10/08 showed Cirrhosis, borderline splenomeg, sl pancreatic atrophy & duct dil, sm umbil hernia, DJD sp, NAD... ~  10/13: she had f/u drDBrodie> last colonoscopy was 2008, she felt pt was too high risk for prep & sedation for a f/u colonoscopy & they decided to hold off...  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 ~  10/11:  presents w/ dysuria & UTI Rx w/ Cipro... ~  12/11:  She had f/u DrGrapey... ~  6/14: she was treated for a sens EColi UTI during 6/14 Hosp w/ Cipro...  OSTEOARTHRITIS (ICD-715.90)  FIBROMYALGIA (ICD-729.1) - she persists w/ mult somatic complaints...  ? of OSTEOPOROSIS (ICD-733.00) - she had normal BMD in 2001 at [place]..  RESTLESS LEGS SYNDROME (ICD-333.94) - she prev weaned off the Requip & remains on SINEMET-CR 25-100Bid... ~  9/10: now c/o recurrent restless leg symptoms... restart REQUIP 69m & titrate up. ~  12/10: persistant symptoms on 2230mhs... rec to incr to 30m10m. ~  2/11: she reports resolution of RLS on 30mg66mquip Qhs, resting well now. ~  12/11:  Recurrent RLS symptoms despite the Requip & seen by DrDohmeier w/ addition of Sinemet 25/100 Bid... ~  2012:  Stable on Requip30mg 70minemet 25/100 Bid... ~  7/13:  She is once again c/o  incr RLS symptoms & rec to restart REQUIP 30mg/d22m  ~  8/13: she had f/u visit w/ DrDohmeier> RLS, tremors, & insomnia; Seroquel helped for awhile, she is working w/ DrHejaImmunologistlp her problems... ~  7/14: on Requip4Qhs & Sinemet25/100Bid from DrDohmeier (RLS, Trmeor, Gait abn) w/ fair control of symptoms- her note is reviewed & they stopped the Sinemet...  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 Amy SiViann [place] for counselling...  Adm to [facility] in May09 by DrBarbSmith w/ depression and meds adjusted (see DC summary)...  Now followed [facility]jaJobe [facility]ellyVirgil [facility] and meds changed to RISPERDAL, PRISTIQUE, BUPROPION, SEROQUEL, XANAX & Nuvigil (Armodafinil)...  husb indicates that she is much better at present & continues outpt Rx... ~  MRI/ MRA Brain 2/08 showed sm vessel dis & atherosclerotic changes in post circ... ~  9/10: pt indicates that she is off the Trazodone... DrHejazi added Wellbutrin ?for her RLS? ~  2011:  pt continues regular f/u w/ Psychiatry on numerous meds w/ freq med adjustments; she is requested to [facility] & bring all med bottles to her OVs for review... ~  2012:  We have not received any notes from [facility]; pt has not complied w/ our request for med bottles to  be brought to each OV... ~  2013:  We still don't have accurate list of her current meds as we have not received [facility] notes & she NEVER brings meds to office despite our requests... ~  7/14: on RX per Psychiatry DrHejazi (we don't have notes from him) & she does not know her meds, didn't bring med bottles or med list!  As best we can tell she is on Nuvigil (on hold), Pristiq100, WellbutrinXL150, Seroquel100-4Qhs, Xanax64m prn- all from Psychiatry & she is reminded to bring all bottles to every visit...    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 ~  labs 04/18/08 by DrBrodie showed Hg= 10.4, Fe= 52... she ordered 1,20052mV Iron- given 04/26/08. ~  labs 05/19/08 showed Hg= 11.3, Fe= 117... ~  labs 11/09 showed Hg= 12.0... Marland KitchenMarland Kitchen labs 3/10 showed Hg= 12.3, Fe= 79 ~  labs 6/10 showed Hg= 11.5, Fe= 73 ~  labs 12/10 showed Hg= 7.2, Fe= 28 (6%sat)... [facility] DrBrodie in Jan2012. ~  labs 2/11 showed Hg= 11.8, Fe= 95... pt indicates that DrBrodie will be following labs going forward... ~  labs 6/11 showed Hg= 11.2 ~  labs 9/11 showed Hg= 11.1, Fe= 106 ~  Labs 4/12 showed Hg=11.0, Fe=118 ~  Labs 10/12 showed Hg=9.5, Fe=35 (7%sat)... We will arrange for outpt infusion 1000m25m... ~  Labs 12/12 showed Hg=11.4, Fe=92 (26%sat) ~  Labs 3/13 showed Hg=11.2, Fe=71 (17%sat) ~  Labs 5/13 showed Hg=9.8, Fe=53 (13%sat) ~  04/22/12:  She was given 1000mg52mIron Dextran by DrDBrodie... ~  Labs 10/13 showed Hg=11.1, Fe=76 (23%) ~  Labs 1/14 showed Hg=7.0 (down 4pts) Hct=21.9 Fe=51 (11%sat) B12=623, Protime=11.6/ 1.1, AFP=pending; Abd sonar per DrDBrodie- cirrhosis, no ascites. ~  Given IV Iron by DrDBrodie w/ Hg improved to 9.3 & Fe up to 177... ~  Subsequently her Hg settled back to 8.8 & Iron dropped to 70... The pt did not want me to recheck her labs- "DrBrodie does it" ~  [facility] w/ further GIB and had EGD, Capsule Endo, etc- see above...  We are back to checking CBC, Fe monthly to check for needed iron infusions... ~  Labs 7/14 showed Hg= 8.8 and Fe= 58 (14%) so we decided to proceed w/ IV Fe Feraheme 510mg 16mnfusions... ~  Labs 8/14 s/p IV Feraheme x2 showed Hg= 8.8, Fe= 138 (35%sat); Rec to recheck labs Qmo... ~  9/14:  She was hosp by [facility] w/ SOB, anemia- Tx 1u, IV Fe  given...   Past Surgical History  Procedure Laterality Date  . Hemorroidectomy    . Appendectomy    . Vaginal hysterectomy    . Breast biopsy      bilateral  . Cesarean section      x 3  . Appendectomy    . Esophagogastroduodenoscopy  06/12/2012    Procedure: ESOPHAGOGASTRODUODENOSCOPY (EGD);  Surgeon: Dora MLafayette Dragon [facility];  Service: Endoscopy;  Laterality: N/A;  . Balloon dilation  06/12/2012    Procedure: BALLOON DILATION;  Surgeon: Dora MLafayette Dragon Location: [facility];  Service: Endoscopy;  Laterality: N/A;  ?balloon  . Esophagogastroduodenoscopy  09/10/2012    Procedure: ESOPHAGOGASTRODUODENOSCOPY (EGD);  Surgeon: Dora MLafayette Dragon [facility];  Service: Endoscopy;  Laterality: N/A;  . Hot hemostasis  09/10/2012    Procedure: HOT HEMOSTASIS (ARGON PLASMA COAGULATION/BICAP);  Surgeon: Lafayette Dragon, MD;  [facility];  Service: Endoscopy;  Laterality: N/A;  . Esophagogastroduodenoscopy N/A 01/18/2013    Procedure: ESOPHAGOGASTRODUODENOSCOPY (EGD);  Surgeon: Lafayette Dragon, MD;  Location[facility];  Service: Endoscopy;  Laterality: N/A;  . Esophagogastroduodenoscopy N/A 05/24/2013    Procedure: ESOPHAGOGASTRODUODENOSCOPY (EGD);  Surgeon: Jerene Bears, MD;  Location: [place];  Service: Gastroenterology;  Laterality: N/A;  . Hot hemostasis N/A 05/24/2013    Procedure: HOT HEMOSTASIS (ARGON PLASMA COAGULATION/BICAP);  Surgeon: Jerene Bears, MD;  Location: [place];  Service: Gastroenterology;  Laterality: N/A;    Outpatient Encounter Prescriptions as of 09/17/2013  Medication Sig  . albuterol (PROAIR HFA) 108 (90 BASE) MCG/ACT inhaler Inhale 2 puffs into the lungs every 6 (six) hours as needed for wheezing or shortness of breath.   . ALPRAZolam (XANAX) 1 MG tablet Take 1 mg by mouth 3 (three) times daily as needed for anxiety.   . AMBULATORY NON FORMULARY MEDICATION Continuous O2 @@ 3LMP  . b complex vitamins tablet Take 1  tablet by mouth daily.   . B-D ULTRAFINE III SHORT PEN 31G X 8 MM MISC USE AS DIRECTED  . Blood Glucose Monitoring Suppl (ONE TOUCH ULTRA MINI) W/DEVICE KIT Test blood sugar up to three times daily  . buPROPion (WELLBUTRIN XL) 150 MG 24 hr tablet Take 150 mg by mouth daily.  [name] desvenlafaxine (PRISTIQ) 100 MG 24 hr tablet Take 150 mg by mouth daily.  [name] dextromethorphan (DELSYM) 30 MG/5ML liquid Take 60 mg by mouth as needed for cough.  . Fluticasone-Salmeterol (ADVAIR DISKUS) 250-50 MCG/DOSE AEPB INHALE 1 PUFF INTO THE LUNGS 2 (TWO) TIMES DAILY.  . furosemide (LASIX) 20 MG tablet Take 40 mg by mouth 2 (two) times daily.   [name] glucose blood (ONE TOUCH TEST STRIPS) test strip Test blood sugar up to three times daily  . HYDROcodone-acetaminophen (NORCO/VICODIN) 5-325 MG per tablet Take 1 tablet by mouth 3 (three) times daily as needed for moderate pain.  . Insulin Glargine (LANTUS SOLOSTAR [place]) Inject 140 Units into the skin daily before supper.   . insulin lispro (HUMALOG) 100 UNIT/ML KiwkPen Inject 20 Units into the skin daily with supper.  . Lancets (ONETOUCH ULTRASOFT) lancets Test blood sugar up to three times daily  . lidocaine (LIDODERM) 5 % Place 1 patch onto the skin daily. Remove & Discard patch within 12 hours or as directed by MD  . lisinopril (PRINIVIL,ZESTRIL) 5 MG tablet Take 5 mg by mouth at bedtime.   . Multiple Vitamins-Minerals (CENTRUM SILVER PO) Take by mouth daily.  . nadolol (CORGARD) 20 MG tablet Take 0.5 tablets (10 mg total) by mouth daily.  . pantoprazole (PROTONIX) 40 MG tablet Take 1 tablet (40 mg total) by mouth 2 (two) times daily.  . polyethylene glycol (MIRALAX / GLYCOLAX) packet Take 17 g by mouth daily as needed (constipation).  . potassium chloride SA (K-DUR,KLOR-CON) 10 MEQ tablet Take 1 tablet (10 mEq total) by mouth every other day.  . promethazine (PHENERGAN) 25 MG tablet Take 1 tablet (25 mg total) by mouth 2 (two) times daily as needed for nausea.  [name] QUEtiapine  (SEROQUEL) 100 MG tablet Take 400 mg by mouth at bedtime.   [name] rOPINIRole (REQUIP) 4 MG tablet Take 4 mg by mouth at bedtime.  . Saline (OCEAN NASAL SPRAY NA) Place 1 spray into the nose daily as  needed (congestion).  . Simethicone (GAS-X PO) Take 1 tablet by mouth daily as needed (flatulence).   . simvastatin (ZOCOR) 40 MG tablet TAKE 1 TABLET BY MOUTH AT BEDTIME FOR CHOLESTEROL  . sucralfate (CARAFATE) 1 GM/10ML suspension Take 10 mL (1 g total) by mouth 2 (two) times daily.  . Vitamin D, Ergocalciferol, (DRISDOL) 50000 UNITS CAPS capsule Take 50,000 Units by mouth every 7 (seven) days. On fridays  . [DISCONTINUED] methylPREDNIsolone (MEDROL DOSPACK) 4 MG tablet Take as directed    Allergies  Allergen Reactions  . Aspirin Other (See Comments)    Causes nosebleeds  . Penicillins Itching  . Theophylline Nausea And Vomiting    Current Medications, Allergies, Past Medical History, Past Surgical History, Family History, and Social History were reviewed in [employer] record.    Review of Systems         See HPI - all other systems neg except as noted...      The patient complains of weight gain, dyspnea on exertion, muscle weakness, difficulty walking, and depression.  The patient denies anorexia, fever, weight loss, vision loss, decreased hearing, hoarseness, chest pain, syncope, peripheral edema, prolonged cough, headaches, hemoptysis, abdominal pain, melena, hematochezia, severe indigestion/heartburn, hematuria, incontinence, suspicious skin lesions, transient blindness, unusual weight change, abnormal bleeding, enlarged lymph nodes, and angioedema.     Objective:   Physical Exam     WD, Obese, 73 y/o WF in NAD... she is chr ill appearing... GENERAL:  Alert & oriented x 3... HEENT:  [place]/AT, pale conjuct, EOM-wnl, PERRLA, EACs-clear, TMs-wnl, NOSE-clear, THROAT-clear w/ dry MMs... NECK:  Supple w/ fairROM; no JVD; normal carotid impulses w/o bruits; no thyromegaly  or nodules palpated; no lymphadenopathy. CHEST:  Clear to P & A; without wheezes/ rales/ or rhonchi heard... HEART:  Regular Rhythm;  gr 1/6 SEM without rubs or gallops... ABDOMEN:  Obese, soft & nontender w/ panniculus; normal bowel sounds; no organomegaly or masses detected. EXT: without deformities, mod arthritic changes; no varicose veins/ +venous insuffic/ tr edema. NEURO:     no focal neuro deficits... DERM:  No lesions noted; no rash, pale complexion.

## 2013-09-17 NOTE — Telephone Encounter (Signed)
Spoke with pt. She reports she is coughing a lot and having SOB. She is scheduled to come in and see TP this afternoon. Nothing further needed

## 2013-09-17 NOTE — Telephone Encounter (Signed)
Please increase lantus to 170 units qd.

## 2013-09-17 NOTE — Telephone Encounter (Signed)
Pt called back stating that since the dosage change on Monday her blood sugars have been in the high 300s through out the day.  Please advise,  Thanks!

## 2013-09-17 NOTE — Telephone Encounter (Signed)
Pt informed

## 2013-09-17 NOTE — Patient Instructions (Addendum)
Doxycycline 100mg  Twice daily  For 7 days  Mucinex DM Twice daily  As needed  Cough/congestion.  I will call with lab and xray results.  Take extra Furosemide 40mg  daily for 2 days .  Please contact office for sooner follow up if symptoms do not improve or worsen or seek emergency care  Follow up Dr. Lenna Gilford  As planned in 2-3 weeks

## 2013-09-20 ENCOUNTER — Encounter (HOSPITAL_COMMUNITY)
Admission: RE | Admit: 2013-09-20 | Discharge: 2013-09-20 | Disposition: A | Discharge status: Home / Self Care | Payer: Medicare Other | Source: Ambulatory Visit | Attending: Pulmonary Disease | Admitting: Pulmonary Disease

## 2013-09-20 ENCOUNTER — Telehealth: Payer: Self-pay | Admitting: Pulmonary Disease

## 2013-09-20 DIAGNOSIS — D509 Iron deficiency anemia, unspecified: Secondary | ICD-10-CM | POA: Diagnosis not present

## 2013-09-20 LAB — POCT HEMOGLOBIN-HEMACUE: Hemoglobin: 7.7 g/dL — ABNORMAL LOW (ref 12.0–15.0)

## 2013-09-20 LAB — IRON AND TIBC
IRON: 59 ug/dL (ref 42–135)
Saturation Ratios: 13 % — ABNORMAL LOW (ref 20–55)
TIBC: 461 ug/dL (ref 250–470)
UIBC: 402 ug/dL — AB (ref 125–400)

## 2013-09-20 LAB — FERRITIN: Ferritin: 16 ng/mL (ref 10–291)

## 2013-09-20 MED ORDER — DARBEPOETIN ALFA-POLYSORBATE 200 MCG/0.4ML IJ SOLN
200.0000 ug | INTRAMUSCULAR | Status: DC
  Administered 2013-09-20: 13:00:00 200 ug via SUBCUTANEOUS

## 2013-09-20 MED ORDER — DARBEPOETIN ALFA-POLYSORBATE 200 MCG/0.4ML IJ SOLN
INTRAMUSCULAR | Status: AC
  Filled 2013-09-20: qty 0.4

## 2013-09-20 NOTE — Progress Notes (Signed)
Hemoglobin phoned to [name] at Dr Jeannine Kitten office. Advised her of patients dark stools which are not new and  her shortness of breath which she has had x 15 years Order received to give aranesp and draw TIBC & ferritin labs. Labs drawn and medication given.

## 2013-09-20 NOTE — Telephone Encounter (Signed)
Received call report from [facility] @ Short Stay Pt there now for Aranesp - Hemacue today was 7.7.  2 weeks prior was 8.1, and previous level was 7.7 Per Ivin Booty, pt is having dark stools that is not new, blood on toilet paper when she wipes, and dyspnea that is not new Georgiann Mohs will speak with SN and call her back  Per SN: she can have the Aranesp today; ask them the draw an iron panel  Called spoke with Ivin Booty again at [facility] and advised of SN's recs as stated above Ivin Booty asked if just an IBC panel should be drawn or to add a Transferritin >> okay per Leigh to add Transferritin  Will sign off

## 2013-09-20 NOTE — Assessment & Plan Note (Signed)
Mild flare with ?fluid retention  Check cxr and .labs w/ bnp   Plan  Doxycycline 100mg  Twice daily  For 7 days  Mucinex DM Twice daily  As needed  Cough/congestion.  I will call with lab and xray results.  Take extra Furosemide 40mg  daily for 2 days .  Please contact office for sooner follow up if symptoms do not improve or worsen or seek emergency care  Follow up Dr. Lenna Gilford  As planned in 2-3 weeks

## 2013-09-21 NOTE — Progress Notes (Signed)
Quick Note:  Called spoke with patient, advised of cxr results / recs as stated by TP. Pt verbalized her understanding and denied any questions. ______ 

## 2013-09-21 NOTE — Progress Notes (Signed)
Quick Note:  Called spoke with patient, advised of lab results / recs as stated by TP. Pt verbalized her understanding and denied any questions. ______ 

## 2013-09-24 ENCOUNTER — Other Ambulatory Visit: Payer: Self-pay | Admitting: Pulmonary Disease

## 2013-09-24 DIAGNOSIS — D509 Iron deficiency anemia, unspecified: Secondary | ICD-10-CM

## 2013-10-01 ENCOUNTER — Other Ambulatory Visit (HOSPITAL_COMMUNITY): Payer: Self-pay | Admitting: *Deleted

## 2013-10-03 ENCOUNTER — Encounter (HOSPITAL_COMMUNITY): Payer: Self-pay | Admitting: Emergency Medicine

## 2013-10-03 ENCOUNTER — Emergency Department (HOSPITAL_COMMUNITY): Payer: Medicare Other

## 2013-10-03 ENCOUNTER — Observation Stay (HOSPITAL_COMMUNITY)
Admission: EM | Admit: 2013-10-03 | Discharge: 2013-10-04 | Disposition: A | Discharge status: Home / Self Care | Payer: Medicare Other | Attending: Internal Medicine | Admitting: Internal Medicine

## 2013-10-03 DIAGNOSIS — D61818 Other pancytopenia: Secondary | ICD-10-CM

## 2013-10-03 DIAGNOSIS — G2581 Restless legs syndrome: Secondary | ICD-10-CM | POA: Insufficient documentation

## 2013-10-03 DIAGNOSIS — E119 Type 2 diabetes mellitus without complications: Secondary | ICD-10-CM

## 2013-10-03 DIAGNOSIS — Z6839 Body mass index (BMI) 39.0-39.9, adult: Secondary | ICD-10-CM | POA: Insufficient documentation

## 2013-10-03 DIAGNOSIS — J9610 Chronic respiratory failure, unspecified whether with hypoxia or hypercapnia: Secondary | ICD-10-CM | POA: Insufficient documentation

## 2013-10-03 DIAGNOSIS — E1165 Type 2 diabetes mellitus with hyperglycemia: Secondary | ICD-10-CM

## 2013-10-03 DIAGNOSIS — R5381 Other malaise: Secondary | ICD-10-CM | POA: Insufficient documentation

## 2013-10-03 DIAGNOSIS — K449 Diaphragmatic hernia without obstruction or gangrene: Secondary | ICD-10-CM | POA: Insufficient documentation

## 2013-10-03 DIAGNOSIS — K31819 Angiodysplasia of stomach and duodenum without bleeding: Secondary | ICD-10-CM

## 2013-10-03 DIAGNOSIS — I10 Essential (primary) hypertension: Secondary | ICD-10-CM

## 2013-10-03 DIAGNOSIS — I2510 Atherosclerotic heart disease of native coronary artery without angina pectoris: Secondary | ICD-10-CM | POA: Insufficient documentation

## 2013-10-03 DIAGNOSIS — N182 Chronic kidney disease, stage 2 (mild): Secondary | ICD-10-CM | POA: Insufficient documentation

## 2013-10-03 DIAGNOSIS — I129 Hypertensive chronic kidney disease with stage 1 through stage 4 chronic kidney disease, or unspecified chronic kidney disease: Secondary | ICD-10-CM | POA: Insufficient documentation

## 2013-10-03 DIAGNOSIS — M810 Age-related osteoporosis without current pathological fracture: Secondary | ICD-10-CM | POA: Insufficient documentation

## 2013-10-03 DIAGNOSIS — G5600 Carpal tunnel syndrome, unspecified upper limb: Secondary | ICD-10-CM | POA: Insufficient documentation

## 2013-10-03 DIAGNOSIS — R5383 Other fatigue: Secondary | ICD-10-CM

## 2013-10-03 DIAGNOSIS — E7800 Pure hypercholesterolemia, unspecified: Secondary | ICD-10-CM | POA: Insufficient documentation

## 2013-10-03 DIAGNOSIS — K219 Gastro-esophageal reflux disease without esophagitis: Secondary | ICD-10-CM

## 2013-10-03 DIAGNOSIS — D509 Iron deficiency anemia, unspecified: Principal | ICD-10-CM | POA: Insufficient documentation

## 2013-10-03 DIAGNOSIS — I5032 Chronic diastolic (congestive) heart failure: Secondary | ICD-10-CM | POA: Insufficient documentation

## 2013-10-03 DIAGNOSIS — K754 Autoimmune hepatitis: Secondary | ICD-10-CM | POA: Insufficient documentation

## 2013-10-03 DIAGNOSIS — F333 Major depressive disorder, recurrent, severe with psychotic symptoms: Secondary | ICD-10-CM | POA: Diagnosis present

## 2013-10-03 DIAGNOSIS — D696 Thrombocytopenia, unspecified: Secondary | ICD-10-CM | POA: Insufficient documentation

## 2013-10-03 DIAGNOSIS — J4489 Other specified chronic obstructive pulmonary disease: Secondary | ICD-10-CM | POA: Insufficient documentation

## 2013-10-03 DIAGNOSIS — D649 Anemia, unspecified: Secondary | ICD-10-CM

## 2013-10-03 DIAGNOSIS — J449 Chronic obstructive pulmonary disease, unspecified: Secondary | ICD-10-CM

## 2013-10-03 DIAGNOSIS — K766 Portal hypertension: Secondary | ICD-10-CM | POA: Insufficient documentation

## 2013-10-03 DIAGNOSIS — Z794 Long term (current) use of insulin: Secondary | ICD-10-CM | POA: Insufficient documentation

## 2013-10-03 DIAGNOSIS — K319 Disease of stomach and duodenum, unspecified: Secondary | ICD-10-CM | POA: Insufficient documentation

## 2013-10-03 DIAGNOSIS — D500 Iron deficiency anemia secondary to blood loss (chronic): Secondary | ICD-10-CM

## 2013-10-03 DIAGNOSIS — J441 Chronic obstructive pulmonary disease with (acute) exacerbation: Secondary | ICD-10-CM | POA: Diagnosis present

## 2013-10-03 DIAGNOSIS — I509 Heart failure, unspecified: Secondary | ICD-10-CM | POA: Insufficient documentation

## 2013-10-03 DIAGNOSIS — I359 Nonrheumatic aortic valve disorder, unspecified: Secondary | ICD-10-CM | POA: Insufficient documentation

## 2013-10-03 DIAGNOSIS — IMO0001 Reserved for inherently not codable concepts without codable children: Secondary | ICD-10-CM | POA: Insufficient documentation

## 2013-10-03 DIAGNOSIS — D72819 Decreased white blood cell count, unspecified: Secondary | ICD-10-CM | POA: Insufficient documentation

## 2013-10-03 LAB — BASIC METABOLIC PANEL
BUN: 35 mg/dL — ABNORMAL HIGH (ref 6–23)
BUN: 40 mg/dL — AB (ref 6–23)
CHLORIDE: 97 meq/L (ref 96–112)
CO2: 25 mEq/L (ref 19–32)
CO2: 27 meq/L (ref 19–32)
Calcium: 8.7 mg/dL (ref 8.4–10.5)
Calcium: 8.7 mg/dL (ref 8.4–10.5)
Chloride: 102 mEq/L (ref 96–112)
Creatinine, Ser: 1.34 mg/dL — ABNORMAL HIGH (ref 0.50–1.10)
Creatinine, Ser: 1.16 mg/dL — ABNORMAL HIGH (ref 0.50–1.10)
GFR calc Af Amer: 45 mL/min — ABNORMAL LOW (ref >90)
GFR calc Af Amer: 54 mL/min — ABNORMAL LOW (ref >90)
GFR calc non Af Amer: 39 mL/min — ABNORMAL LOW (ref >90)
GFR calc non Af Amer: 47 mL/min — ABNORMAL LOW (ref >90)
GLUCOSE: 262 mg/dL — AB (ref 70–99)
Glucose, Bld: 224 mg/dL — ABNORMAL HIGH (ref 70–99)
POTASSIUM: 4.8 meq/L (ref 3.7–5.3)
Potassium: 4.9 mEq/L (ref 3.7–5.3)
SODIUM: 140 meq/L (ref 137–147)
Sodium: 136 mEq/L — ABNORMAL LOW (ref 137–147)

## 2013-10-03 LAB — CBC
HCT: 22.2 % — ABNORMAL LOW (ref 36.0–46.0)
HEMATOCRIT: 26 % — AB (ref 36.0–46.0)
HEMOGLOBIN: 7 g/dL — AB (ref 12.0–15.0)
Hemoglobin: 8.2 g/dL — ABNORMAL LOW (ref 12.0–15.0)
MCH: 30.4 pg (ref 26.0–34.0)
MCH: 30.6 pg (ref 26.0–34.0)
MCHC: 31.5 g/dL (ref 30.0–36.0)
MCHC: 31.5 g/dL (ref 30.0–36.0)
MCV: 96.3 fL (ref 78.0–100.0)
MCV: 96.9 fL (ref 78.0–100.0)
PLATELETS: 84 10*3/uL — AB (ref 150–400)
Platelets: 141 10*3/uL — ABNORMAL LOW (ref 150–400)
RBC: 2.29 MIL/uL — ABNORMAL LOW (ref 3.87–5.11)
RBC: 2.7 MIL/uL — AB (ref 3.87–5.11)
RDW: 15.5 % (ref 11.5–15.5)
RDW: 15.6 % — ABNORMAL HIGH (ref 11.5–15.5)
WBC: 2.8 10*3/uL — ABNORMAL LOW (ref 4.0–10.5)
WBC: 3.1 10*3/uL — ABNORMAL LOW (ref 4.0–10.5)

## 2013-10-03 LAB — GLUCOSE, CAPILLARY
GLUCOSE-CAPILLARY: 188 mg/dL — AB (ref 70–99)
Glucose-Capillary: 226 mg/dL — ABNORMAL HIGH (ref 70–99)
Glucose-Capillary: 144 mg/dL — ABNORMAL HIGH (ref 70–99)
Glucose-Capillary: 241 mg/dL — ABNORMAL HIGH (ref 70–99)
Glucose-Capillary: 239 mg/dL — ABNORMAL HIGH (ref 70–99)
Glucose-Capillary: 178 mg/dL — ABNORMAL HIGH (ref 70–99)

## 2013-10-03 LAB — POCT I-STAT TROPONIN I: Troponin i, poc: 0.01 ng/mL (ref 0.00–0.08)

## 2013-10-03 LAB — PRO B NATRIURETIC PEPTIDE: Pro B Natriuretic peptide (BNP): 207.5 pg/mL — ABNORMAL HIGH (ref 0–125)

## 2013-10-03 LAB — PREPARE RBC (CROSSMATCH)

## 2013-10-03 MED ORDER — VITAMIN D (ERGOCALCIFEROL) 1.25 MG (50000 UNIT) PO CAPS: 50000.0000 [IU] | ORAL_CAPSULE | ORAL | Status: DC

## 2013-10-03 MED ORDER — MOMETASONE FURO-FORMOTEROL FUM 100-5 MCG/ACT IN AERO
2.0000 | INHALATION_SPRAY | Freq: Two times a day (BID) | RESPIRATORY_TRACT | Status: DC
  Administered 2013-10-03 – 2013-10-04 (×2): 2 via RESPIRATORY_TRACT
  Filled 2013-10-03: qty 8.8

## 2013-10-03 MED ORDER — ALBUTEROL SULFATE (2.5 MG/3ML) 0.083% IN NEBU: 2.5000 mg | INHALATION_SOLUTION | Freq: Four times a day (QID) | RESPIRATORY_TRACT | Status: DC | PRN

## 2013-10-03 MED ORDER — PANTOPRAZOLE SODIUM 40 MG PO TBEC
40.0000 mg | DELAYED_RELEASE_TABLET | Freq: Two times a day (BID) | ORAL | Status: DC
  Administered 2013-10-03 – 2013-10-04 (×2): 40 mg via ORAL
  Filled 2013-10-03 (×2): qty 1

## 2013-10-03 MED ORDER — INSULIN LISPRO 100 UNIT/ML (KWIKPEN): 20.0000 [IU] | PEN_INJECTOR | Freq: Every day | SUBCUTANEOUS | Status: DC

## 2013-10-03 MED ORDER — BUPROPION HCL ER (XL) 150 MG PO TB24
150.0000 mg | ORAL_TABLET | Freq: Every day | ORAL | Status: DC
  Administered 2013-10-03 – 2013-10-04 (×2): 150 mg via ORAL
  Filled 2013-10-03 (×2): qty 1

## 2013-10-03 MED ORDER — DESVENLAFAXINE SUCCINATE ER 50 MG PO TB24
150.0000 mg | ORAL_TABLET | Freq: Every day | ORAL | Status: DC
Start: 2013-10-03 — End: 2013-10-04
  Administered 2013-10-03 – 2013-10-04 (×2): 150 mg via ORAL
  Filled 2013-10-03 (×2): qty 3

## 2013-10-03 MED ORDER — LISINOPRIL 5 MG PO TABS
5.0000 mg | ORAL_TABLET | Freq: Every day | ORAL | Status: DC
  Administered 2013-10-03: 5 mg via ORAL
  Filled 2013-10-03 (×2): qty 1

## 2013-10-03 MED ORDER — ONDANSETRON HCL 4 MG PO TABS: 4.0000 mg | ORAL_TABLET | Freq: Four times a day (QID) | ORAL | Status: DC | PRN

## 2013-10-03 MED ORDER — POTASSIUM CHLORIDE CRYS ER 10 MEQ PO TBCR
10.0000 meq | EXTENDED_RELEASE_TABLET | ORAL | Status: DC
  Administered 2013-10-03: 10 meq via ORAL
  Filled 2013-10-03: qty 1

## 2013-10-03 MED ORDER — DESVENLAFAXINE SUCCINATE ER 100 MG PO TB24: 100.0000 mg | ORAL_TABLET | Freq: Every day | ORAL | Status: DC

## 2013-10-03 MED ORDER — PROMETHAZINE HCL 25 MG PO TABS: 25.0000 mg | ORAL_TABLET | Freq: Two times a day (BID) | ORAL | Status: DC | PRN

## 2013-10-03 MED ORDER — ACETAMINOPHEN 650 MG RE SUPP: 650.0000 mg | Freq: Four times a day (QID) | RECTAL | Status: DC | PRN

## 2013-10-03 MED ORDER — INSULIN GLARGINE 100 UNIT/ML ~~LOC~~ SOLN
140.0000 [IU] | Freq: Every day | SUBCUTANEOUS | Status: DC
  Administered 2013-10-03: 140 [IU] via SUBCUTANEOUS
  Filled 2013-10-03 (×2): qty 1.4

## 2013-10-03 MED ORDER — INSULIN ASPART 100 UNIT/ML ~~LOC~~ SOLN: 0.0000 [IU] | Freq: Every day | SUBCUTANEOUS | Status: DC

## 2013-10-03 MED ORDER — NADOLOL 20 MG PO TABS
10.0000 mg | ORAL_TABLET | Freq: Every day | ORAL | Status: DC
  Administered 2013-10-03 – 2013-10-04 (×2): 10 mg via ORAL
  Filled 2013-10-03 (×2): qty 1

## 2013-10-03 MED ORDER — INSULIN ASPART 100 UNIT/ML ~~LOC~~ SOLN
20.0000 [IU] | Freq: Every day | SUBCUTANEOUS | Status: DC
  Administered 2013-10-03: 20 [IU] via SUBCUTANEOUS

## 2013-10-03 MED ORDER — INSULIN ASPART 100 UNIT/ML ~~LOC~~ SOLN
0.0000 [IU] | Freq: Three times a day (TID) | SUBCUTANEOUS | Status: DC
  Administered 2013-10-03: 3 [IU] via SUBCUTANEOUS
  Administered 2013-10-03: 1 [IU] via SUBCUTANEOUS
  Administered 2013-10-03 – 2013-10-04 (×2): 2 [IU] via SUBCUTANEOUS

## 2013-10-03 MED ORDER — QUETIAPINE FUMARATE 400 MG PO TABS
400.0000 mg | ORAL_TABLET | Freq: Every day | ORAL | Status: DC
  Administered 2013-10-03: 400 mg via ORAL
  Filled 2013-10-03 (×2): qty 1

## 2013-10-03 MED ORDER — SIMVASTATIN 40 MG PO TABS
40.0000 mg | ORAL_TABLET | Freq: Every day | ORAL | Status: DC
  Administered 2013-10-03: 40 mg via ORAL
  Filled 2013-10-03 (×2): qty 1

## 2013-10-03 MED ORDER — ALBUTEROL SULFATE (2.5 MG/3ML) 0.083% IN NEBU
5.0000 mg | INHALATION_SOLUTION | Freq: Once | RESPIRATORY_TRACT | Status: AC
  Administered 2013-10-03: 5 mg via RESPIRATORY_TRACT
  Filled 2013-10-03: qty 6

## 2013-10-03 MED ORDER — B COMPLEX PO TABS: 1.0000 | ORAL_TABLET | Freq: Every day | ORAL | Status: DC

## 2013-10-03 MED ORDER — ACETAMINOPHEN 325 MG PO TABS: 650.0000 mg | ORAL_TABLET | Freq: Four times a day (QID) | ORAL | Status: DC | PRN

## 2013-10-03 MED ORDER — ALPRAZOLAM 0.5 MG PO TABS
1.0000 mg | ORAL_TABLET | Freq: Three times a day (TID) | ORAL | Status: DC | PRN
  Administered 2013-10-03: 1 mg via ORAL
  Filled 2013-10-03: qty 2

## 2013-10-03 MED ORDER — ONDANSETRON HCL 4 MG/2ML IJ SOLN: 4.0000 mg | Freq: Four times a day (QID) | INTRAMUSCULAR | Status: DC | PRN

## 2013-10-03 MED ORDER — FUROSEMIDE 10 MG/ML IJ SOLN
20.0000 mg | Freq: Once | INTRAMUSCULAR | Status: AC
  Administered 2013-10-03: 20 mg via INTRAVENOUS
  Filled 2013-10-03: qty 2

## 2013-10-03 MED ORDER — POLYETHYLENE GLYCOL 3350 17 G PO PACK: 17.0000 g | PACK | Freq: Every day | ORAL | Status: DC | PRN

## 2013-10-03 MED ORDER — B COMPLEX-C PO TABS
1.0000 | ORAL_TABLET | Freq: Every day | ORAL | Status: DC
  Administered 2013-10-03 – 2013-10-04 (×2): 1 via ORAL
  Filled 2013-10-03 (×2): qty 1

## 2013-10-03 MED ORDER — LIDOCAINE 5 % EX PTCH
1.0000 | MEDICATED_PATCH | Freq: Every day | CUTANEOUS | Status: DC | PRN
  Filled 2013-10-03: qty 1

## 2013-10-03 MED ORDER — ADULT MULTIVITAMIN W/MINERALS CH
1.0000 | ORAL_TABLET | Freq: Every day | ORAL | Status: DC
  Administered 2013-10-03 – 2013-10-04 (×2): 1 via ORAL
  Filled 2013-10-03 (×2): qty 1

## 2013-10-03 MED ORDER — ROPINIROLE HCL 1 MG PO TABS
4.0000 mg | ORAL_TABLET | Freq: Every day | ORAL | Status: DC
  Administered 2013-10-03: 4 mg via ORAL
  Filled 2013-10-03 (×2): qty 4

## 2013-10-03 MED ORDER — FUROSEMIDE 40 MG PO TABS
40.0000 mg | ORAL_TABLET | Freq: Two times a day (BID) | ORAL | Status: DC
  Administered 2013-10-03 – 2013-10-04 (×3): 40 mg via ORAL
  Filled 2013-10-03 (×5): qty 1

## 2013-10-03 MED ORDER — ALBUTEROL SULFATE (2.5 MG/3ML) 0.083% IN NEBU: 2.5000 mg | INHALATION_SOLUTION | RESPIRATORY_TRACT | Status: DC | PRN

## 2013-10-03 NOTE — ED Notes (Signed)
Pt states she has been having SOB tonight due to anxiety over her restless leg syndrome not stopping.  Pt states she has been having increased weakness over the past two months, that has not made her ambulation difficult.

## 2013-10-03 NOTE — ED Provider Notes (Signed)
CSN: [id number]     Arrival date & time 10/03/13  0028 History   First MD Initiated Contact with Patient 10/03/13 0100     Chief Complaint  Patient presents with  . Shortness of Breath   (Consider location/radiation/quality/duration/timing/severity/associated sxs/prior Treatment) HPI Patient is a 73 yo woman with oxygen dependent COPD along with a host of co-morbidities. She says she is here because her restless leg syndrome got so bad tonight that she found it hard to walk despite taking her regular meds.   Patient notes feeling SOB while ambulating in her house. She notes associated wheezing. She has had a productive cough without hemoptysis. No fever. NO chest pain.   Past Medical History  Diagnosis Date  . Chronic airway obstruction, not elsewhere classified   . Unspecified chronic bronchitis   . Unspecified essential hypertension   . Coronary atherosclerosis of unspecified type of vessel, native or graft   . Palpitations   . Aortic valve disorders   . Pure hypercholesterolemia   . Morbid obesity   . Esophageal reflux   . Angiodysplasia of intestine (without mention of hemorrhage)   . Diverticulosis of colon (without mention of hemorrhage)   . Benign neoplasm of colon   . Autoimmune hepatitis   . Cystitis, unspecified   . Osteoarthrosis, unspecified whether generalized or localized, unspecified site   . Osteoporosis, unspecified   . Restless legs syndrome (RLS)   . Major depressive disorder, recurrent episode, severe, specified as with psychotic behavior   . Iron deficiency anemia secondary to blood loss (chronic)   . Coarse tremors     "just in my arms"  . Carpal tunnel syndrome on both sides   . Dysrhythmia     palpitations  . CHF (congestive heart failure)   . Asthma   . Shortness of breath     "any time really"  . Sleep apnea     "but I don't use the equipment"  . Type II diabetes mellitus   . Blood transfusion   . H/O hiatal hernia   . Hepatic cirrhosis    dx'd 1990's  . Adenomatous colon polyp   . Anginal pain   . Anxiety   . Pneumonia   . Heart murmur   . AVM (arteriovenous malformation)   . Portal hypertensive gastropathy   . Falls frequently   . UTI (lower urinary tract infection)   . Hiatal hernia   . GAVE (gastric antral vascular ectasia)    Past Surgical History  Procedure Laterality Date  . Hemorroidectomy    . Appendectomy    . Vaginal hysterectomy    . Breast biopsy      bilateral  . Cesarean section      x 3  . Appendectomy    . Esophagogastroduodenoscopy  06/12/2012    Procedure: ESOPHAGOGASTRODUODENOSCOPY (EGD);  Surgeon: Lafayette Dragon, MD;  Location: [facility];  Service: Endoscopy;  Laterality: N/A;  . Balloon dilation  06/12/2012    Procedure: BALLOON DILATION;  Surgeon: Lafayette Dragon, MD;  Location: [facility];  Service: Endoscopy;  Laterality: N/A;  ?balloon  . Esophagogastroduodenoscopy  09/10/2012    Procedure: ESOPHAGOGASTRODUODENOSCOPY (EGD);  Surgeon: Lafayette Dragon, MD;  Location: [facility];  Service: Endoscopy;  Laterality: N/A;  . Hot hemostasis  09/10/2012    Procedure: HOT HEMOSTASIS (ARGON PLASMA COAGULATION/BICAP);  Surgeon: Lafayette Dragon, MD;  Location: [facility];  Service: Endoscopy;  Laterality: N/A;  . Esophagogastroduodenoscopy N/A 01/18/2013    Procedure: ESOPHAGOGASTRODUODENOSCOPY (EGD);  Surgeon: Lafayette Dragon, MD;  Location: [facility] END[facility]COPY;  Service: Endoscopy;  Laterality: N/A;  . Esophagogastroduodenoscopy N/A 05/24/2013    Procedure: ESOPHAGOGASTRODUODENOSCOPY (EGD);  Surgeon: Jerene Bears, MD;  Location: [facility];  Service: Gastroenterology;  Laterality: N/A;  . Hot hemostasis N/A 05/24/2013    Procedure: HOT HEMOSTASIS (ARGON PLASMA COAGULATION/BICAP);  Surgeon: Jerene Bears, MD;  Location: [facility];  Service: Gastroenterology;  Laterality: N/A;   Family History  Problem Relation Age of Onset  . Heart disease Mother   . Cervical cancer Mother   . Kidney disease Mother   .  Diabetes Mother   . Breast cancer Sister   . Multiple sclerosis Sister   . Colon cancer Neg Hx   . Breast cancer Maternal Aunt     x 2  . Diabetes Father   . Diabetes Sister   . Multiple sclerosis Other    History  Substance Use Topics  . Smoking status: Former Smoker -- 1.50 packs/day for 25 years    Types: Cigarettes    Quit date: 08/26/1992  . Smokeless tobacco: Never Used  . Alcohol Use: No     Comment: 6/57/84 "last alcoholic drink was years ago"   OB History   Grav Para Term Preterm Abortions TAB SAB Ect Mult Living                 Review of Systems Ten point review of symptoms performed and is negative with the exception of symptoms noted above and chronic back pain, chronic abdominal pain, chronic restless leg syndrome.   Allergies  Aspirin; Penicillins; and Theophylline  Home Medications   Current Outpatient Rx  Name  Route  Sig  Dispense  Refill  . albuterol (PROAIR HFA) 108 (90 BASE) MCG/ACT inhaler   Inhalation   Inhale 2 puffs into the lungs every 6 (six) hours as needed for wheezing or shortness of breath.          . ALPRAZolam (XANAX) 1 MG tablet   Oral   Take 1 mg by mouth 3 (three) times daily as needed for anxiety.          . AMBULATORY NON FORMULARY MEDICATION      Continuous O2 @@ 3LMP         . b complex vitamins tablet   Oral   Take 1 tablet by mouth daily.          . B-D ULTRAFINE III SHORT PEN 31G X 8 MM MISC      USE AS DIRECTED   100 each   3   . Blood Glucose Monitoring Suppl (ONE TOUCH ULTRA MINI) W/DEVICE KIT      Test blood sugar up to three times daily   1 each   0   . buPROPion (WELLBUTRIN XL) 150 MG 24 hr tablet   Oral   Take 150 mg by mouth daily.         [name] desvenlafaxine (PRISTIQ) 100 MG 24 hr tablet   Oral   Take 150 mg by mouth daily.         [name] dextromethorphan (DELSYM) 30 MG/5ML liquid   Oral   Take 60 mg by mouth as needed for cough.         . doxycycline (VIBRA-TABS) 100 MG tablet   Oral    Take 1 tablet (100 mg total) by mouth 2 (two) times daily.   14 tablet   0   . Fluticasone-Salmeterol (ADVAIR DISKUS) 250-50 MCG/DOSE  AEPB      INHALE 1 PUFF INTO THE LUNGS 2 (TWO) TIMES DAILY.   14 each   0   . furosemide (LASIX) 20 MG tablet   Oral   Take 40 mg by mouth 2 (two) times daily.          [name] glucose blood (ONE TOUCH TEST STRIPS) test strip      Test blood sugar up to three times daily   100 each   12   . HYDROcodone-acetaminophen (NORCO/VICODIN) 5-325 MG per tablet   Oral   Take 1 tablet by mouth 3 (three) times daily as needed for moderate pain.   90 tablet   0   . Insulin Glargine (LANTUS SOLOSTAR [place])   Subcutaneous   Inject 140 Units into the skin daily before supper.          . insulin lispro (HUMALOG) 100 UNIT/ML KiwkPen   Subcutaneous   Inject 20 Units into the skin daily with supper.         . Lancets (ONETOUCH ULTRASOFT) lancets      Test blood sugar up to three times daily   100 each   12   . lidocaine (LIDODERM) 5 %   Transdermal   Place 1 patch onto the skin daily. Remove & Discard patch within 12 hours or as directed by MD   30 patch   5   . lisinopril (PRINIVIL,ZESTRIL) 5 MG tablet   Oral   Take 5 mg by mouth at bedtime.          . Multiple Vitamins-Minerals (CENTRUM SILVER PO)   Oral   Take by mouth daily.         . nadolol (CORGARD) 20 MG tablet   Oral   Take 0.5 tablets (10 mg total) by mouth daily.         . pantoprazole (PROTONIX) 40 MG tablet   Oral   Take 1 tablet (40 mg total) by mouth 2 (two) times daily.   60 tablet   11     Take 30 minutes before the 1st and last meal of th ...   . polyethylene glycol (MIRALAX / GLYCOLAX) packet   Oral   Take 17 g by mouth daily as needed (constipation).         . potassium chloride SA (K-DUR,KLOR-CON) 10 MEQ tablet   Oral   Take 1 tablet (10 mEq total) by mouth every other day.   30 tablet   1   . promethazine (PHENERGAN) 25 MG tablet   Oral   Take 1  tablet (25 mg total) by mouth 2 (two) times daily as needed for nausea.   30 tablet   5   . QUEtiapine (SEROQUEL) 100 MG tablet   Oral   Take 400 mg by mouth at bedtime.          [name] rOPINIRole (REQUIP) 4 MG tablet   Oral   Take 4 mg by mouth at bedtime.         . Saline (OCEAN NASAL SPRAY NA)   Nasal   Place 1 spray into the nose daily as needed (congestion).         . Simethicone (GAS-X PO)   Oral   Take 1 tablet by mouth daily as needed (flatulence).          . simvastatin (ZOCOR) 40 MG tablet      TAKE 1 TABLET BY MOUTH AT BEDTIME FOR CHOLESTEROL   30  tablet   6   . sucralfate (CARAFATE) 1 GM/10ML suspension   Oral   Take 10 mL (1 g total) by mouth 2 (two) times daily.   420 mL   1   . Vitamin D, Ergocalciferol, (DRISDOL) 50000 UNITS CAPS capsule   Oral   Take 50,000 Units by mouth every 7 (seven) days. On fridays          There were no vitals taken for this visit. Physical Exam Gen: well developed and obese, no acute distress Head: NCAT Eyes: PERL, EOMI Nose: no epistaixis or rhinorrhea Mouth/throat: mucosa is moist and pink Neck: supple, no stridor Lungs:RR 20/min, sats 99% on 3L 02 (baseline) overall good air exchange with occasional wheezing, rhonchi or rales CV: RRR, 2/6 systolic murmur, extremities appear well perfused.  Abd: soft, obese, notender, nondistended Back: no ttp, no cva ttp Skin: warm and dry Ext: normal to inspection, no dependent edema Neuro: CN ii-xii grossly intact, no focal deficits Psyche; flat  affect,  calm and cooperative.   ED Course  Procedures (including critical care time) Labs Review Results for orders placed during the hospital encounter of 10/03/13 (from the past 24 hour(s))  BASIC METABOLIC PANEL     Status: Abnormal   Collection Time    10/03/13 12:45 AM      Result Value Range   Sodium 136 (*) 137 - 147 mEq/L   Potassium 4.8  3.7 - 5.3 mEq/L   Chloride 97  96 - 112 mEq/L   CO2 25  19 - 32 mEq/L    Glucose, Bld 262 (*) 70 - 99 mg/dL   BUN 40 (*) 6 - 23 mg/dL   Creatinine, Ser 1.34 (*) 0.50 - 1.10 mg/dL   Calcium 8.7  8.4 - 10.5 mg/dL   GFR calc non Af Amer 39 (*) >90 mL/min   GFR calc Af Amer 45 (*) >90 mL/min  CBC     Status: Abnormal   Collection Time    10/03/13 12:45 AM      Result Value Range   WBC 2.8 (*) 4.0 - 10.5 K/uL   RBC 2.29 (*) 3.87 - 5.11 MIL/uL   Hemoglobin 7.0 (*) 12.0 - 15.0 g/dL   HCT 22.2 (*) 36.0 - 46.0 %   MCV 96.9  78.0 - 100.0 fL   MCH 30.6  26.0 - 34.0 pg   MCHC 31.5  30.0 - 36.0 g/dL   RDW 15.5  11.5 - 15.5 %   Platelets 141 (*) 150 - 400 K/uL  PRO B NATRIURETIC PEPTIDE     Status: Abnormal   Collection Time    10/03/13 12:45 AM      Result Value Range   Pro B Natriuretic peptide (BNP) 207.5 (*) 0 - 125 pg/mL  POCT I-STAT TROPONIN I     Status: None   Collection Time    10/03/13  1:00 AM      Result Value Range   Troponin i, poc 0.01  0.00 - 0.08 ng/mL   Comment 3            PORTABLE CHEST - 1 VIEW  COMPARISON: 09/17/2013 and prior chest radiographs dating back to 07/17/2007  FINDINGS: The cardiomediastinal silhouette is unremarkable.  Mild peribronchial thickening and left mid lung scar again noted.  There is no evidence of focal airspace disease, pulmonary edema, suspicious pulmonary nodule/mass, pleural effusion, or pneumothorax. No acute bony abnormalities are identified.  IMPRESSION: No active disease.   Electronically Signed By:  [name] M.D. On: 10/03/2013 01:14   EKG: nsr, no acute ischemic changes, normal intervals, normal axis, normal qrs complex  MDM  Wheezing has resolved and patient is feeling better following single neb. Sats remain 99% on baseline 02 requirement. I believe that the patient's main problem is symptomatic anemia with Hgb of 7.0. We have typed and screened and ordered transfusion with 1 unit of PRBCs. Case discussed with Dr. Reece Levy who will see and evaluate. 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      Elyn Peers, MD 10/03/13 [phone]

## 2013-10-03 NOTE — H&P (Signed)
Patient's PCP: Noralee Space, MD  Chief Complaint: Weakness, shortness of breath, worsening of her restless leg syndrome  History of Present Illness: [name] is a 73 y.o. Caucasian female with history of morbid obesity, chronic respiratory failure due to COPD on 3 L of oxygen, hypercholesterolemia, GERD, GAVE, autoimmune hepatitis, chronic anemia on IV iron and Procrit/Cipro every 2 weeks, chronic diastolic congestive heart failure, type 2 diabetes who presents with the above complaints.  Patient reports that she has been feeling weak since March of 2014.  She has had frequent episodes of anemia and has required blood transfusions in the past.  Over the last 3-4 days she has noted increased weakness and shortness of breath due to her weakness.  She also has noted that her restless leg syndrome has been worse, as a result she presented to the emergency department for further evaluation.  She was found to be anemic with a hemoglobin of 7.0.  Hospitalist service was asked to admit the patient for further care and management.  Patient denies any recent fevers, chills, vomiting, chest pain, abdominal pain, diarrhea, headaches or vision changes.  She does report being nauseated recently.  She noted that her stools have been dark recently.  Review of Systems: All systems reviewed with the patient and positive as per history of present illness, otherwise all other systems are negative.  Past Medical History  Diagnosis Date  . Chronic airway obstruction, not elsewhere classified   . Unspecified chronic bronchitis   . Unspecified essential hypertension   . Coronary atherosclerosis of unspecified type of vessel, native or graft   . Palpitations   . Aortic valve disorders   . Pure hypercholesterolemia   . Morbid obesity   . Esophageal reflux   . Angiodysplasia of intestine (without mention of hemorrhage)   . Diverticulosis of colon (without mention of hemorrhage)   . Benign neoplasm of colon   .  Autoimmune hepatitis   . Cystitis, unspecified   . Osteoarthrosis, unspecified whether generalized or localized, unspecified site   . Osteoporosis, unspecified   . Restless legs syndrome (RLS)   . Major depressive disorder, recurrent episode, severe, specified as with psychotic behavior   . Iron deficiency anemia secondary to blood loss (chronic)   . Coarse tremors     "just in my arms"  . Carpal tunnel syndrome on both sides   . Dysrhythmia     palpitations  . CHF (congestive heart failure)   . Asthma   . Shortness of breath     "any time really"  . Sleep apnea     "but I don't use the equipment"  . Type II diabetes mellitus   . Blood transfusion   . H/O hiatal hernia   . 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 Adenomatous colon polyp   . Anginal pain   . Anxiety   . Pneumonia   . Heart murmur   . AVM (arteriovenous malformation)   . Portal hypertensive gastropathy   . Falls frequently   . UTI (lower urinary tract infection)   . Hiatal hernia   . GAVE (gastric antral vascular ectasia)    Past Surgical History  Procedure Laterality Date  . Hemorroidectomy    . Appendectomy    . Vaginal hysterectomy    . Breast biopsy      bilateral  . Cesarean section      x 3  . Appendectomy    . Esophagogastroduodenoscopy  06/12/2012  Procedure: ESOPHAGOGASTRODUODENOSCOPY (EGD);  Surgeon: Lafayette Dragon, MD;  Location: [facility];  Service: Endoscopy;  Laterality: N/A;  . Balloon dilation  06/12/2012    Procedure: BALLOON DILATION;  Surgeon: Lafayette Dragon, MD;  Location: [facility];  Service: Endoscopy;  Laterality: N/A;  ?balloon  . Esophagogastroduodenoscopy  09/10/2012    Procedure: ESOPHAGOGASTRODUODENOSCOPY (EGD);  Surgeon: Lafayette Dragon, MD;  Location: [facility];  Service: Endoscopy;  Laterality: N/A;  . Hot hemostasis  09/10/2012    Procedure: HOT HEMOSTASIS (ARGON PLASMA COAGULATION/BICAP);  Surgeon: Lafayette Dragon, MD;  Location: [facility];  Service: Endoscopy;   Laterality: N/A;  . Esophagogastroduodenoscopy N/A 01/18/2013    Procedure: ESOPHAGOGASTRODUODENOSCOPY (EGD);  Surgeon: Lafayette Dragon, MD;  Location: [facility] END[facility]COPY;  Service: Endoscopy;  Laterality: N/A;  . Esophagogastroduodenoscopy N/A 05/24/2013    Procedure: ESOPHAGOGASTRODUODENOSCOPY (EGD);  Surgeon: Jerene Bears, MD;  Location: [place];  Service: Gastroenterology;  Laterality: N/A;  . Hot hemostasis N/A 05/24/2013    Procedure: HOT HEMOSTASIS (ARGON PLASMA COAGULATION/BICAP);  Surgeon: Jerene Bears, MD;  Location: [place];  Service: Gastroenterology;  Laterality: N/A;   Family History  Problem Relation Age of Onset  . Heart disease Mother   . Cervical cancer Mother   . Kidney disease Mother   . Diabetes Mother   . Breast cancer Sister   . Multiple sclerosis Sister   . Colon cancer Neg Hx   . Breast cancer Maternal Aunt     x 2  . Diabetes Father   . Diabetes Sister   . Multiple sclerosis Other    History   Social History  . Marital Status: Married    Spouse Name: jerry Stump    Number of Children: 4  . Years of Education: 12   Occupational History  . Disabled   .     Social History Main Topics  . Smoking status: Former Smoker -- 1.50 packs/day for 25 years    Types: Cigarettes    Quit date: 08/26/1992  . Smokeless tobacco: Never Used  . Alcohol Use: No     Comment: 6/46/80 "last alcoholic drink was years ago"  . Drug Use: No  . Sexual Activity: No   Other Topics Concern  . Not on file   Social History Narrative   Patient lives at home with her husband [name]).    Retired.   Caffeine- None    Right handed.   Allergies: Aspirin; Penicillins; and Theophylline  Home Meds: Prior to Admission medications   Medication Sig Start Date End Date Taking? Authorizing Provider  albuterol (PROAIR HFA) 108 (90 BASE) MCG/ACT inhaler Inhale 2 puffs into the lungs every 6 (six) hours as needed for wheezing or shortness of breath.    Yes Historical Provider, MD   ALPRAZolam Duanne Moron) 1 MG tablet Take 1 mg by mouth 3 (three) times daily as needed for anxiety.    Yes Historical Provider, MD  b complex vitamins tablet Take 1 tablet by mouth daily.    Yes Historical Provider, MD  buPROPion (WELLBUTRIN XL) 150 MG 24 hr tablet Take 150 mg by mouth daily.   Yes Historical Provider, MD  desvenlafaxine (PRISTIQ) 100 MG 24 hr tablet Take 100 mg by mouth daily. Takes with a 52m tablet, total daily dose is 1544m  Yes Historical Provider, MD  desvenlafaxine (PRISTIQ) 50 MG 24 hr tablet Take 50 mg by mouth daily. Takes with a 10068mablet, total daily dose is 150m62mYes Historical  Provider, MD  dextromethorphan (DELSYM) 30 MG/5ML liquid Take 60 mg by mouth at bedtime.    Yes Historical Provider, MD  Fluticasone-Salmeterol (ADVAIR DISKUS) 250-50 MCG/DOSE AEPB INHALE 1 PUFF INTO THE LUNGS 2 (TWO) TIMES DAILY. 07/27/13  Yes Noralee Space, MD  furosemide (LASIX) 20 MG tablet Take 40 mg by mouth 2 (two) times daily.  06/15/13  Yes Scott T Kathlen Mody, PA-C  Insulin Glargine (LANTUS SOLOSTAR [place]) Inject 140 Units into the skin daily before supper.    Yes Historical Provider, MD  insulin lispro (HUMALOG) 100 UNIT/ML KiwkPen Inject 20 Units into the skin daily with supper. 07/15/13  Yes Renato Shin, MD  lidocaine (LIDODERM) 5 % Place 1 patch onto the skin daily as needed (apply to right shoulder or right back rib area for pain). Remove & Discard patch within 12 hours or as directed by MD   Yes Historical Provider, MD  lisinopril (PRINIVIL,ZESTRIL) 5 MG tablet Take 5 mg by mouth at bedtime.    Yes Historical Provider, MD  Multiple Vitamins-Minerals (CENTRUM SILVER PO) Take by mouth daily.   Yes Historical Provider, MD  nadolol (CORGARD) 20 MG tablet Take 0.5 tablets (10 mg total) by mouth daily. 08/09/13  Yes Scott T Kathlen Mody, PA-C  pantoprazole (PROTONIX) 40 MG tablet Take 1 tablet (40 mg total) by mouth 2 (two) times daily. 05/31/13  Yes Noralee Space, MD  potassium chloride SA  (K-DUR,KLOR-CON) 10 MEQ tablet Take 1 tablet (10 mEq total) by mouth every other day. 02/03/13  Yes Marianne L York, PA-C  QUEtiapine (SEROQUEL) 400 MG tablet Take 400 mg by mouth at bedtime.   Yes Historical Provider, MD  rOPINIRole (REQUIP) 4 MG tablet Take 4 mg by mouth at bedtime.   Yes Historical Provider, MD  Saline (OCEAN NASAL SPRAY NA) Place 1 spray into the nose daily as needed (congestion).   Yes Historical Provider, MD  Simethicone (GAS-X PO) Take 1 tablet by mouth daily as needed (flatulence).    Yes Historical Provider, MD  simvastatin (ZOCOR) 40 MG tablet TAKE 1 TABLET BY MOUTH AT BEDTIME FOR CHOLESTEROL 07/19/13  Yes Noralee Space, MD  AMBULATORY NON FORMULARY MEDICATION Continuous O2 @@ 3LMP    Historical Provider, MD  B-D ULTRAFINE III SHORT PEN 31G X 8 MM MISC USE AS DIRECTED 01/16/13   Noralee Space, MD  Blood Glucose Monitoring Suppl (ONE TOUCH ULTRA MINI) W/DEVICE KIT Test blood sugar up to three times daily 09/02/13   Noralee Space, MD  glucose blood (ONE TOUCH TEST STRIPS) test strip Test blood sugar up to three times daily 09/02/13   Noralee Space, MD  Lancets [facility] ULTRASOFT) lancets Test blood sugar up to three times daily 09/02/13   Noralee Space, MD  polyethylene glycol (MIRALAX / Floria Raveling) packet Take 17 g by mouth daily as needed (constipation).    Historical Provider, MD  promethazine (PHENERGAN) 25 MG tablet Take 1 tablet (25 mg total) by mouth 2 (two) times daily as needed for nausea. 06/15/13   Noralee Space, MD  Vitamin D, Ergocalciferol, (DRISDOL) 50000 UNITS CAPS capsule Take 50,000 Units by mouth every 7 (seven) days. [place] Provider, MD    Physical Exam: Blood pressure 116/23, pulse 50, resp. rate 20, SpO2 99.00%. General: Awake, Oriented x3, No acute distress, pale in appearance. HEENT: EOMI, Moist mucous membranes Neck: Supple CV: S1 and S2 Lungs: Clear to ascultation bilaterally Abdomen: Soft, Nontender, Nondistended, +bowel  sounds. Ext:  Good pulses. Trace edema. No clubbing or cyanosis noted. Neuro: Cranial Nerves II-XII grossly intact. Has 5/5 motor strength in upper and lower extremities.  Lab results:  Recent Labs  10/03/13 0045  NA 136*  K 4.8  CL 97  CO2 25  GLUCOSE 262*  BUN 40*  CREATININE 1.34*  CALCIUM 8.7   No results found for this basename: AST, ALT, ALKPHOS, BILITOT, PROT, ALBUMIN,  in the last 72 hours No results found for this basename: LIPASE, AMYLASE,  in the last 72 hours  Recent Labs  10/03/13 0045  WBC 2.8*  HGB 7.0*  HCT 22.2*  MCV 96.9  PLT 141*   No results found for this basename: CKTOTAL, CKMB, CKMBINDEX, TROPONINI,  in the last 72 hours No components found with this basename: POCBNP,  No results found for this basename: DDIMER,  in the last 72 hours No results found for this basename: HGBA1C,  in the last 72 hours No results found for this basename: CHOL, HDL, LDLCALC, TRIG, CHOLHDL, LDLDIRECT,  in the last 72 hours No results found for this basename: TSH, T4TOTAL, FREET3, T3FREE, THYROIDAB,  in the last 72 hours No results found for this basename: VITAMINB12, FOLATE, FERRITIN, TIBC, IRON, RETICCTPCT,  in the last 72 hours Imaging results:  Dg Chest 2 View  09/17/2013   CLINICAL DATA:  Coughing and congestion.  EXAM: CHEST  2 VIEW  COMPARISON:  DG RIBS UNILATERAL*R* dated 07/01/2013; DG CHEST 2 VIEW dated 07/01/2013; DG CHEST 1V PORT dated 04/24/2013  FINDINGS: Two views of the chest were obtained. Again noted are chronic lung markings in the lower chest. Linear density in the left mid lung is suggestive for atelectasis or scarring. Heart size is upper limits of normal but stable. Slightly increased densities along the right middle lobe could represent atelectasis. No definite pleural effusions  IMPRESSION: Chronic lung changes with slightly increased densities in the right middle lobe region. Findings are nonspecific but could represent atelectasis.   Electronically Signed    By: Markus Daft M.D.   On: 09/17/2013 17:31   Dg Chest Portable 1 View  10/03/2013   CLINICAL DATA:  73 year old female with shortness of breath.  EXAM: PORTABLE CHEST - 1 VIEW  COMPARISON:  09/17/2013 and prior chest radiographs dating back to 07/17/2007  FINDINGS: The cardiomediastinal silhouette is unremarkable.  Mild peribronchial thickening and left mid lung scar again noted.  There is no evidence of focal airspace disease, pulmonary edema, suspicious pulmonary nodule/mass, pleural effusion, or pneumothorax. No acute bony abnormalities are identified.  IMPRESSION: No active disease.   Electronically Signed   By: [name] M.D.   On: 10/03/2013 01:14   Other results: EKG: Sinus rhythm with first degree AV block..  Assessment & Plan by Problem: Anemia, iron deficiency anemia and GI bleed due to GAVE Continue twice a day PPI.  Continue low-dose nadolol, hold for heart rate less than 60.  Patient has had EGD on 05/25/2013 which showed gave status post APC.  Patient is being transfused 1 unit of PRBC.  Continue Procrit/epo and IV iron as outpatient.  Trend CBC after blood transfusion.  Generalized weakness Likely due to symptomatic anemia.  Shortness of breath Again strongly suspect is likely due to symptomatic anemia.  GAVE/Autoimmune hepatitis Management as per GI, Dr. Olevia Perches.  Hypertension Stable.  Continue nadolol and low dose lisinopril with hold parameters.  Uncontrolled diabetes Sliding scale insulin.  Lantus on subcutaneous Humalog mealtime.  COPD/chronic respiratory failure Stable.  Continue home  inhalers.  Continue 3 L of oxygen.  Pancytopenia Likely due to autoimmune hepatitis.  Chronic diastolic congestive heart failure Compensated.  Continue home Lasix.  Aortic stenosis Stable.  Chronic kidney disease stage II Renal function baseline.  Restless leg syndrome Continue home medications.  GERD Continue PPI.  Prophylaxis SCDs.  No heparin products given  anemia.  CODE STATUS DO NOT RESUSCITATE/DO NOT INTUBATE.  This was discussed with the patient and family at the time of admission.  Disposition Admit patient as observation to medical bed.  Time spent on admission, talking to the patient, and coordinating care was: 50 mins.  Austina Constantin A, MD 10/03/2013, 2:59 AM

## 2013-10-03 NOTE — Progress Notes (Signed)
Utilization Review Completed.Donne Anon T2/03/2014

## 2013-10-03 NOTE — Progress Notes (Signed)
Patient admitted this AM by Dr. Reece Levy.  She is SOB at baseline but still feels SOB more than usual- given 1 unit PRBC- await repeat Hgb Gave dose of lasix IV as patient's weight has increased by at least 15lbs since December  Francella Barnett DO

## 2013-10-03 NOTE — ED Notes (Signed)
Sob and restless leg syndrome; sob started yesterday afternoon. Hx. Of asthma and copd. Home o2 3 l.  Productive cough: yellow.

## 2013-10-04 ENCOUNTER — Encounter (HOSPITAL_COMMUNITY)
Admission: RE | Admit: 2013-10-04 | Discharge: 2013-10-04 | Disposition: A | Discharge status: Home / Self Care | Payer: Medicare Other | Source: Ambulatory Visit | Attending: Pulmonary Disease | Admitting: Pulmonary Disease

## 2013-10-04 DIAGNOSIS — I10 Essential (primary) hypertension: Secondary | ICD-10-CM

## 2013-10-04 DIAGNOSIS — D509 Iron deficiency anemia, unspecified: Secondary | ICD-10-CM | POA: Insufficient documentation

## 2013-10-04 DIAGNOSIS — G2581 Restless legs syndrome: Secondary | ICD-10-CM

## 2013-10-04 DIAGNOSIS — E119 Type 2 diabetes mellitus without complications: Secondary | ICD-10-CM

## 2013-10-04 DIAGNOSIS — D500 Iron deficiency anemia secondary to blood loss (chronic): Secondary | ICD-10-CM

## 2013-10-04 LAB — TYPE AND SCREEN
ABO/RH(D): O POS
Antibody Screen: POSITIVE
DAT, IGG: NEGATIVE
Donor AG Type: NEGATIVE
UNIT DIVISION: 0

## 2013-10-04 LAB — POCT HEMOGLOBIN-HEMACUE: Hemoglobin: 8.2 g/dL — ABNORMAL LOW (ref 12.0–15.0)

## 2013-10-04 LAB — GLUCOSE, CAPILLARY: GLUCOSE-CAPILLARY: 103 mg/dL — AB (ref 70–99)

## 2013-10-04 MED ORDER — DARBEPOETIN ALFA-POLYSORBATE 200 MCG/0.4ML IJ SOLN
200.0000 ug | INTRAMUSCULAR | Status: DC
  Administered 2013-10-04: 200 ug via SUBCUTANEOUS

## 2013-10-04 MED ORDER — DARBEPOETIN ALFA-POLYSORBATE 200 MCG/0.4ML IJ SOLN
INTRAMUSCULAR | Status: AC
  Administered 2013-10-04: 200 ug via SUBCUTANEOUS
  Filled 2013-10-04: qty 0.4

## 2013-10-04 MED ORDER — FERUMOXYTOL INJECTION 510 MG/17 ML
510.0000 mg | INTRAVENOUS | Status: DC
  Administered 2013-10-04: 510 mg via INTRAVENOUS

## 2013-10-04 MED ORDER — SODIUM CHLORIDE 0.9 % IV SOLN: INTRAVENOUS | Status: DC

## 2013-10-04 MED ORDER — FERUMOXYTOL INJECTION 510 MG/17 ML
INTRAVENOUS | Status: AC
  Administered 2013-10-04: 510 mg via INTRAVENOUS
  Filled 2013-10-04: qty 17

## 2013-10-04 NOTE — Discharge Summary (Signed)
Physician Discharge Summary  RUMAYSA SABATINO OVF:[id number] DOB: Nov 14, 1940 DOA: 10/03/2013  PCP: Noralee Space, MD  Admit date: 10/03/2013 Discharge date: 10/04/2013  Time spent: >30  minutes  Recommendations for Outpatient Follow-up:  1. BMET to follow renal function and electrolytes 2. CBC to follow pancytopenia trend 3. 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 Will discharge home under family care. Patient will go to short stay after discharge in order to received iron and Aranesp infusion.   Diet recommendation: low sodium diet  Filed Weights   10/03/13 0413  Weight: 105.6 kg (232 lb 12.9 oz)    History of present illness:  73 y.o. Caucasian female with history of morbid obesity, chronic respiratory failure due to COPD on 3 L of oxygen, hypercholesterolemia, GERD, GAVE, autoimmune hepatitis, chronic anemia on IV iron and Procrit/Cipro every 2 weeks, chronic diastolic congestive heart failure, type 2 diabetes who presents with the above complaints. Patient reports that she has been feeling weak since March of 2014. She has had frequent episodes of anemia and has required blood transfusions in the past. Over the last 3-4 days she has noted increased weakness and shortness of breath due to her weakness. She also has noted that her restless leg syndrome has been worse, as a result she presented to the emergency department for further evaluation. She was found to be anemic with a hemoglobin of 7.0. Hospitalist service was asked to admit the patient for further care and management. Patient denies any recent fevers, chills, vomiting, chest pain, abdominal pain, diarrhea, headaches or vision  changes. She does report being nauseated recently. She noted that her stools have been dark recently.   Hospital Course:  Anemia, iron deficiency anemia and GI bleed due to GAVE  Continue twice a day PPI.  -Patient has had EGD on 05/25/2013 which showed gave status post APC.  -Patient is being transfused 1 unit of PRBC. Continue Aranesp and IV iron as outpatient.  -Trend CBC during follow up visit -continue PPI and follow with GI service -at discharge Hg 8.2 and symptomatic anemia significantly improved.  Generalized weakness  Likely due to symptomatic anemia and deconditioning -patient reports she had all the assistance she needs at home. -symptoms improved with transfusion.  Shortness of breath  -likely due to symptomatic anemia. -symptoms improved after PRBC transfusion -continue iron and aranesp -continue close follow up as an outpatient.   GAVE/Autoimmune hepatitis  -Management as per GI, Dr. Olevia Perches; next appointment on 2/13. -continue PPI   Hypertension  Stable. Continue nadolol and low dose lisinopril   Uncontrolled diabetes  CBG's stable. Continue home insulin regimen. Further adjustment to be done by PCP to her hypoglycemic regimen.  COPD/chronic respiratory failure  Stable. Continue home inhalers. Continue 3 L of oxygen.  -no wheezing -patient reports improvement in worsening SOB after transfusion  Leukopenia and Thrombocytopenia Likely due to autoimmune hepatitis.  -levels stable -will monitor CBC  Chronic diastolic congestive  heart failure  Compensated.  -Continue home Lasix; low sodium diet and daily weight   Aortic stenosis  Stable.   Chronic kidney disease stage II  Renal function at baseline.  -BMET to be repeated during follow up visit, to trend renal function and electrolytes.  Restless leg syndrome  Continue home medications.   GERD  Continue PPI.   *rest of medical problems remains stable and the plan is to continue current medication  regimen.  Procedures:  See below for x-ray reports.  Consultations:  None   Discharge Exam: Filed Vitals:   10/04/13 0521  BP: 105/45  Pulse: 55  Temp: 98 F (36.7 C)  Resp: 18    General: stable, denies SOB and  Chest painat rest Cardiovascular: rate control, positive SEM, no rubs or gallops Respiratory: scattered rhonchi, no crackles, good air movement Abdomen: soft, NT, ND, positive BS Extremities: no edema, no cyanosis  Discharge Instructions  Discharge Orders   Future Appointments Provider Department Dept Phone   10/04/2013 1:00 PM [facility] [place] [phone]   10/08/2013 2:15 PM Lafayette Dragon, MD [facility] [phone]   10/13/2013 3:00 PM Noralee Space, MD [facility] [phone]   11/23/2013 10:15 AM Sherren Mocha, MD [place] [facility] [phone]   12/07/2013 10:30 AM Renato Shin, MD [facility] [phone]   Future Orders Complete By Expires   Diet - low sodium heart healthy  As directed    Discharge instructions  As directed    Comments:     Follow a low sodium diet (less than 2000 mg daily) Take medications as prescribed Arrange follow up with PCP in 10 days Follow with gastroenterologist (Dr. Olevia Perches) as previously instructed       Medication List         ALPRAZolam 1 MG tablet  Commonly known as:  XANAX  Take 1 mg by mouth 3 (three) times daily as needed for anxiety.     AMBULATORY NON FORMULARY MEDICATION  Continuous O2 @@ 3LMP     b complex vitamins tablet  Take 1 tablet by mouth daily.     B-D ULTRAFINE III SHORT PEN 31G X 8 MM Misc  Generic drug:  Insulin Pen Needle  USE AS DIRECTED     buPROPion 150 MG 24 hr tablet  Commonly known as:  WELLBUTRIN XL  Take 150 mg by mouth daily.     CENTRUM SILVER PO  Take by mouth daily.     desvenlafaxine 50 MG 24 hr tablet  Commonly known as:  PRISTIQ  Take 50 mg by mouth  daily. Takes with a 169m tablet, total daily dose is 1580m    desvenlafaxine 100 MG 24 hr tablet  Commonly known as:  PRISTIQ  Take 100 mg by mouth daily. Takes with a 506mablet, total daily dose is 150m65m  dextromethorphan 30 MG/5ML liquid  Commonly known as:  DELSYM  Take 60 mg by mouth at bedtime.     Fluticasone-Salmeterol 250-50 MCG/DOSE Aepb  Commonly known as:  ADVAIR DISKUS  INHALE 1 PUFF INTO THE LUNGS 2 (TWO) TIMES DAILY.     furosemide 20 MG tablet  Commonly known as:  LASIX  Take 40 mg by mouth 2 (two) times daily.     GAS-X PO  Take 1 tablet by mouth daily as needed (flatulence).     glucose blood test strip  Commonly known as:  ONE TOUCH TEST STRIPS  Test blood sugar up to three times daily     insulin lispro 100 UNIT/ML KiwkPen  Commonly known as:  HUMALOG  Inject 20 Units into the skin daily with supper.     LANTUS SOLOSTAR [place]  Inject 140 Units into the skin daily before supper.     lidocaine 5 %  Commonly known as:  LIDODERM  Place 1 patch onto the skin daily as needed (apply to right shoulder or right back rib area for pain). Remove & Discard patch within 12 hours or as directed by MD     lisinopril 5 MG tablet  Commonly known as:  PRINIVIL,ZESTRIL  Take 5 mg by mouth at bedtime.     nadolol 20 MG tablet  Commonly known as:  CORGARD  Take 0.5 tablets (10 mg total) by mouth daily.     OCEAN NASAL SPRAY NA  Place 1 spray into the nose daily as needed (congestion).     ONE TOUCH ULTRA MINI W/DEVICE Kit  Test blood sugar up to three times daily     onetouch ultrasoft lancets  Test blood sugar up to three times daily     pantoprazole 40 MG tablet  Commonly known as:  PROTONIX  Take 1 tablet (40 mg total) by mouth 2 (two) times daily.     polyethylene glycol packet  Commonly known as:  MIRALAX / GLYCOLAX  Take 17 g by mouth daily as needed (constipation).     potassium chloride 10 MEQ tablet  Commonly known as:  K-DUR,KLOR-CON  Take 1  tablet (10 mEq total) by mouth every other day.     PROAIR HFA 108 (90 BASE) MCG/ACT inhaler  Generic drug:  albuterol  Inhale 2 puffs into the lungs every 6 (six) hours as needed for wheezing or shortness of breath.     promethazine 25 MG tablet  Commonly known as:  PHENERGAN  Take 1 tablet (25 mg total) by mouth 2 (two) times daily as needed for nausea.     QUEtiapine 400 MG tablet  Commonly known as:  SEROQUEL  Take 400 mg by mouth at bedtime.     rOPINIRole 4 MG tablet  Commonly known as:  REQUIP  Take 4 mg by mouth at bedtime.     simvastatin 40 MG tablet  Commonly known as:  ZOCOR  TAKE 1 TABLET BY MOUTH AT BEDTIME FOR CHOLESTEROL     Vitamin D (Ergocalciferol) 50000 UNITS Caps capsule  Commonly known as:  DRISDOL  Take 50,000 Units by mouth every 7 (seven) days. On fridays       Allergies  Allergen Reactions  . Aspirin Other (See Comments)    Causes nosebleeds  . Penicillins Itching  . Theophylline Nausea And Vomiting       Follow-up Information   Follow up with NADEL,SCOTT M, MD. Schedule an appointment as soon as possible for a visit in 10 days.   Specialty:  Pulmonary Disease   Contact information:   [address] [place] [address] [phone]        The results of significant diagnostics from this hospitalization (including imaging, microbiology, ancillary and laboratory) are listed below for reference.    Significant Diagnostic Studies: Dg Chest 2 View  09/17/2013   CLINICAL DATA:  Coughing and congestion.  EXAM: CHEST  2 VIEW  COMPARISON:  DG RIBS UNILATERAL*R* dated 07/01/2013; DG CHEST 2 VIEW dated 07/01/2013; DG CHEST 1V PORT dated 04/24/2013  FINDINGS: Two views  of the chest were obtained. Again noted are chronic lung markings in the lower chest. Linear density in the left mid lung is suggestive for atelectasis or scarring. Heart size is upper limits of normal but stable. Slightly increased densities along the right middle lobe could represent  atelectasis. No definite pleural effusions  IMPRESSION: Chronic lung changes with slightly increased densities in the right middle lobe region. Findings are nonspecific but could represent atelectasis.   Electronically Signed   By: Markus Daft M.D.   On: 09/17/2013 17:31   Dg Chest Portable 1 View  10/03/2013   CLINICAL DATA:  73 year old female with shortness of breath.  EXAM: PORTABLE CHEST - 1 VIEW  COMPARISON:  09/17/2013 and prior chest radiographs dating back to 07/17/2007  FINDINGS: The cardiomediastinal silhouette is unremarkable.  Mild peribronchial thickening and left mid lung scar again noted.  There is no evidence of focal airspace disease, pulmonary edema, suspicious pulmonary nodule/mass, pleural effusion, or pneumothorax. No acute bony abnormalities are identified.  IMPRESSION: No active disease.   Electronically Signed   By: [name] M.D.   On: 10/03/2013 01:14   Labs: Basic Metabolic Panel:  Recent Labs Lab 10/03/13 0045 10/03/13 1230  NA 136* 140  K 4.8 4.9  CL 97 102  CO2 25 27  GLUCOSE 262* 224*  BUN 40* 35*  CREATININE 1.34* 1.16*  CALCIUM 8.7 8.7   CBC:  Recent Labs Lab 10/03/13 0045 10/03/13 1230  WBC 2.8* 3.1*  HGB 7.0* 8.2*  HCT 22.2* 26.0*  MCV 96.9 96.3  PLT 141* 84*   BNP: BNP (last 3 results)  Recent Labs  06/06/13 2041 09/17/13 1713 10/03/13 0045  PROBNP 217.7* 147.0* 207.5*   CBG:  Recent Labs Lab 10/03/13 1101 10/03/13 1557 10/03/13 1735 10/03/13 2108 10/04/13 0708  GLUCAP 188* 241* 239* 178* 103*    Signed:  Deavion Strider  [facility] 10/04/2013, 12:49 PM

## 2013-10-04 NOTE — Progress Notes (Signed)
Patient discharged. IV left in place for short-stay use. Patient is to receive aranesp and iron infusion per MD order. Patient left unit in a stable condition via wheelchair.

## 2013-10-05 LAB — GLUCOSE, CAPILLARY: GLUCOSE-CAPILLARY: 175 mg/dL — AB (ref 70–99)

## 2013-10-07 ENCOUNTER — Encounter (HOSPITAL_COMMUNITY)
Admission: RE | Admit: 2013-10-07 | Discharge: 2013-10-07 | Disposition: A | Discharge status: Home / Self Care | Payer: Medicare Other | Source: Ambulatory Visit | Attending: Pulmonary Disease | Admitting: Pulmonary Disease

## 2013-10-07 VITALS — BP 137/46 | HR 54 | Temp 97.6°F | Resp 20 | Ht 64.0 in | Wt 230.0 lb

## 2013-10-07 DIAGNOSIS — D509 Iron deficiency anemia, unspecified: Secondary | ICD-10-CM | POA: Diagnosis not present

## 2013-10-07 MED ORDER — FERUMOXYTOL INJECTION 510 MG/17 ML
INTRAVENOUS | Status: DC
Start: 2013-10-07 — End: 2013-10-08
  Filled 2013-10-07: qty 17

## 2013-10-07 MED ORDER — FERUMOXYTOL INJECTION 510 MG/17 ML
510.0000 mg | Freq: Once | INTRAVENOUS | Status: AC
  Administered 2013-10-07: 510 mg via INTRAVENOUS

## 2013-10-08 ENCOUNTER — Ambulatory Visit (INDEPENDENT_AMBULATORY_CARE_PROVIDER_SITE_OTHER): Payer: Medicare Other | Admitting: Internal Medicine

## 2013-10-08 ENCOUNTER — Encounter: Payer: Self-pay | Admitting: Internal Medicine

## 2013-10-08 ENCOUNTER — Other Ambulatory Visit (INDEPENDENT_AMBULATORY_CARE_PROVIDER_SITE_OTHER): Payer: Medicare Other

## 2013-10-08 ENCOUNTER — Telehealth: Payer: Self-pay | Admitting: Pulmonary Disease

## 2013-10-08 VITALS — BP 110/60 | HR 56 | Ht 64.0 in | Wt 230.5 lb

## 2013-10-08 DIAGNOSIS — K7460 Unspecified cirrhosis of liver: Secondary | ICD-10-CM

## 2013-10-08 DIAGNOSIS — D509 Iron deficiency anemia, unspecified: Secondary | ICD-10-CM

## 2013-10-08 DIAGNOSIS — D500 Iron deficiency anemia secondary to blood loss (chronic): Secondary | ICD-10-CM

## 2013-10-08 LAB — BASIC METABOLIC PANEL
BUN: 34 mg/dL — AB (ref 6–23)
CALCIUM: 9.1 mg/dL (ref 8.4–10.5)
CO2: 29 mEq/L (ref 19–32)
CREATININE: 1.3 mg/dL — AB (ref 0.4–1.2)
Chloride: 102 mEq/L (ref 96–112)
GFR: 42.63 mL/min — AB (ref >60.00)
Glucose, Bld: 132 mg/dL — ABNORMAL HIGH (ref 70–99)
POTASSIUM: 4.2 meq/L (ref 3.5–5.1)
Sodium: 137 mEq/L (ref 135–145)

## 2013-10-08 LAB — CBC WITH DIFFERENTIAL/PLATELET
BASOS PCT: 0.6 % (ref 0.0–3.0)
Basophils Absolute: 0 10*3/uL (ref 0.0–0.1)
EOS ABS: 0.2 10*3/uL (ref 0.0–0.7)
Eosinophils Relative: 4.3 % (ref 0.0–5.0)
Hemoglobin: 8.6 g/dL — ABNORMAL LOW (ref 12.0–15.0)
LYMPHS ABS: 0.3 10*3/uL — AB (ref 0.7–4.0)
Lymphocytes Relative: 7.7 % — ABNORMAL LOW (ref 12.0–46.0)
MCHC: 32.1 g/dL (ref 30.0–36.0)
MCV: 95.1 fl (ref 78.0–100.0)
MONO ABS: 0.4 10*3/uL (ref 0.1–1.0)
Monocytes Relative: 8.4 % (ref 3.0–12.0)
NEUTROS PCT: 79 % — AB (ref 43.0–77.0)
Neutro Abs: 3.4 10*3/uL (ref 1.4–7.7)
Platelets: 100 10*3/uL — ABNORMAL LOW (ref 150.0–400.0)
RBC: 2.82 Mil/uL — AB (ref 3.87–5.11)
RDW: 16.6 % — ABNORMAL HIGH (ref 11.5–14.6)
WBC: 4.3 10*3/uL — ABNORMAL LOW (ref 4.5–10.5)

## 2013-10-08 LAB — AMMONIA: Ammonia: 66 umol/L — ABNORMAL HIGH (ref 11–35)

## 2013-10-08 MED ORDER — FLUTICASONE-SALMETEROL 250-50 MCG/DOSE IN AEPB: INHALATION_SPRAY | RESPIRATORY_TRACT | Status: DC

## 2013-10-08 NOTE — Progress Notes (Signed)
DEBAR PLATE 09/21/1940 [id number]  Note: This dictation was prepared with Dragon digital system. Any transcriptional errors that result from this procedure are unintentional.   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 She has portal hypertensive gastropathy resulting in chronic GI blood loss. She has had multiple gastric AVMs ablations Argon with laser in the past 10 years; most recently in September 2014. She has had  recent hospitalization from 10/03/13-10/04/2013 for respiratory failure and fluid overload. She is now oxygen-dependent on 3 L a minute. She is a type II diabetic and is chronically anemic, often requiring iron infusions and blood transfusions. Her last appointment was in November 2014. Her last hemoglobin was 7.7 with a 13% iron saturation and low ferritin. She received second iron infusion yesterday. Her last upper abdominal ultrasound compared to 2011 showed moderate to severe cirrhosis with no focal lesion. She is here today with her family and caretaker to make decisions about end of life decision and possible hospice involvement. [name] FNP is the patient's caregiver who sees the patient every 2 weeks. checks  vital signs. and can draw blood.    Past Medical History  Diagnosis Date  . Chronic airway obstruction, not elsewhere classified   . Unspecified chronic bronchitis   . Unspecified essential hypertension   . Coronary atherosclerosis of unspecified type of vessel, native or graft   . Palpitations   . Aortic valve disorders   . Pure hypercholesterolemia   . Morbid obesity   . Esophageal reflux   . Angiodysplasia of intestine (without mention of hemorrhage)   . Diverticulosis of colon (without mention of hemorrhage)   . Benign neoplasm of colon   . Autoimmune hepatitis   . Cystitis, unspecified   . Osteoarthrosis, unspecified whether generalized or  localized, unspecified site   . Osteoporosis, unspecified   . Restless legs syndrome (RLS)   . Major depressive disorder, recurrent episode, severe, specified as with psychotic behavior   . Iron deficiency anemia secondary to blood loss (chronic)   . Coarse tremors     "just in my arms"  . Carpal tunnel syndrome on both sides   . Dysrhythmia     palpitations  . CHF (congestive heart failure)   . Asthma   . Shortness of breath     "any time really"  . Sleep apnea     "but I don't use the equipment"  . Type II diabetes mellitus   . Blood transfusion   . H/O hiatal hernia   . 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 Adenomatous colon polyp   . Anginal pain   . Anxiety   . Pneumonia   . Heart murmur   . AVM (arteriovenous malformation)   . Portal hypertensive gastropathy   . Falls frequently   . UTI (lower urinary tract infection)   . Hiatal hernia   . GAVE (gastric antral vascular ectasia)     Past Surgical History  Procedure Laterality Date  . Hemorroidectomy    . Appendectomy    . Vaginal hysterectomy    . Breast biopsy      bilateral  . Cesarean section      x 3  . Appendectomy    . Esophagogastroduodenoscopy  06/12/2012    Procedure: ESOPHAGOGASTRODUODENOSCOPY (EGD);  Surgeon: Lafayette Dragon, MD;  Location: [facility];  Service: Endoscopy;  Laterality: N/A;  . Balloon dilation  06/12/2012  Procedure: BALLOON DILATION;  Surgeon: Lafayette Dragon, MD;  Location: [facility];  Service: Endoscopy;  Laterality: N/A;  ?balloon  . Esophagogastroduodenoscopy  09/10/2012    Procedure: ESOPHAGOGASTRODUODENOSCOPY (EGD);  Surgeon: Lafayette Dragon, MD;  Location: [facility];  Service: Endoscopy;  Laterality: N/A;  . Hot hemostasis  09/10/2012    Procedure: HOT HEMOSTASIS (ARGON PLASMA COAGULATION/BICAP);  Surgeon: Lafayette Dragon, MD;  Location: [facility];  Service: Endoscopy;  Laterality: N/A;  . Esophagogastroduodenoscopy N/A 01/18/2013    Procedure: ESOPHAGOGASTRODUODENOSCOPY  (EGD);  Surgeon: Lafayette Dragon, MD;  Location: [facility];  Service: Endoscopy;  Laterality: N/A;  . Esophagogastroduodenoscopy N/A 05/24/2013    Procedure: ESOPHAGOGASTRODUODENOSCOPY (EGD);  Surgeon: Jerene Bears, MD;  Location: [facility];  Service: Gastroenterology;  Laterality: N/A;  . Hot hemostasis N/A 05/24/2013    Procedure: HOT HEMOSTASIS (ARGON PLASMA COAGULATION/BICAP);  Surgeon: Jerene Bears, MD;  Location: [place];  Service: Gastroenterology;  Laterality: N/A;    Allergies  Allergen Reactions  . Aspirin Other (See Comments)    Causes nosebleeds  . Penicillins Itching  . Theophylline Nausea And Vomiting    Family history and social history have been reviewed.  Review of Systems: Dyspnea on exertion denies chest pain  The remainder of the 10 point ROS is negative except as outlined in the H&P  Physical Exam: General Appearance Well developed, in no distress in a wheelchair. Chronically ill appearing with nasal oxygen Eyes  Non icteric  HEENT  Non traumatic, normocephalic  Mouth No lesion, tongue papillated, no cheilosis Neck Supple without adenopathy, thyroid not enlarged, no carotid bruits, no JVD Lungs Clear to auscultation bilaterally COR Normal S1, normal S2, regular rhythm, no murmur, quiet precordium Abdomen obese protuberant soft no ascites Rectal not done Extremities  No pedal edema Skin No lesions Neurological Alert and oriented x 3 no asterixis Psychological Normal mood and affect  Assessment and Plan:  Problem #1 Advanced liver disease with portal hypertension and chronic GI blood loss but reasonable synthetic function. She is not a candidate for liver transplant.. She now also has O 2 dependent COPD. She has  complications of congestive heart failure as a result of the fluid overload and severe anemia.. Her synthetic liver function has been reasonably good including serum albumin, prothrombin time and liver function tests. We will repeat these tests  today including  venous ammonia. Her caregiver will check her blood count monthly and iron panel every 2 months. We will also check her BMET every 4 weeks.     [name] 10/08/2013

## 2013-10-08 NOTE — Telephone Encounter (Signed)
Called and lmom to make the pt aware that the samples have been left up front for them to pick up.  Nothing further is needed.

## 2013-10-08 NOTE — Patient Instructions (Signed)
Your physician has requested that you go to the basement for the following lab work before leaving today: Ammonia, CBC, BMET  Please follow up with Dr Olevia Perches in 2 months.  Cc: Dr Teressa Lower

## 2013-10-11 ENCOUNTER — Other Ambulatory Visit: Payer: Self-pay | Admitting: *Deleted

## 2013-10-11 DIAGNOSIS — D649 Anemia, unspecified: Secondary | ICD-10-CM

## 2013-10-11 MED ORDER — LACTULOSE 10 GM/15ML PO SOLN: ORAL | Status: DC

## 2013-10-13 ENCOUNTER — Encounter: Payer: Self-pay | Admitting: Pulmonary Disease

## 2013-10-13 ENCOUNTER — Ambulatory Visit (INDEPENDENT_AMBULATORY_CARE_PROVIDER_SITE_OTHER): Payer: Medicare Other | Admitting: Pulmonary Disease

## 2013-10-13 VITALS — BP 122/68 | HR 53 | Temp 97.7°F | Ht 64.0 in | Wt 230.0 lb

## 2013-10-13 DIAGNOSIS — R0609 Other forms of dyspnea: Secondary | ICD-10-CM

## 2013-10-13 DIAGNOSIS — K754 Autoimmune hepatitis: Secondary | ICD-10-CM

## 2013-10-13 DIAGNOSIS — G2581 Restless legs syndrome: Secondary | ICD-10-CM

## 2013-10-13 DIAGNOSIS — J42 Unspecified chronic bronchitis: Secondary | ICD-10-CM

## 2013-10-13 DIAGNOSIS — I5032 Chronic diastolic (congestive) heart failure: Secondary | ICD-10-CM

## 2013-10-13 DIAGNOSIS — I359 Nonrheumatic aortic valve disorder, unspecified: Secondary | ICD-10-CM

## 2013-10-13 DIAGNOSIS — I2510 Atherosclerotic heart disease of native coronary artery without angina pectoris: Secondary | ICD-10-CM

## 2013-10-13 DIAGNOSIS — R0600 Dyspnea, unspecified: Secondary | ICD-10-CM

## 2013-10-13 DIAGNOSIS — D509 Iron deficiency anemia, unspecified: Secondary | ICD-10-CM

## 2013-10-13 DIAGNOSIS — R531 Weakness: Secondary | ICD-10-CM

## 2013-10-13 DIAGNOSIS — J449 Chronic obstructive pulmonary disease, unspecified: Secondary | ICD-10-CM

## 2013-10-13 DIAGNOSIS — R0989 Other specified symptoms and signs involving the circulatory and respiratory systems: Secondary | ICD-10-CM

## 2013-10-13 DIAGNOSIS — E7800 Pure hypercholesterolemia, unspecified: Secondary | ICD-10-CM

## 2013-10-13 DIAGNOSIS — I10 Essential (primary) hypertension: Secondary | ICD-10-CM

## 2013-10-13 DIAGNOSIS — K31819 Angiodysplasia of stomach and duodenum without bleeding: Secondary | ICD-10-CM

## 2013-10-13 DIAGNOSIS — E1049 Type 1 diabetes mellitus with other diabetic neurological complication: Secondary | ICD-10-CM

## 2013-10-13 DIAGNOSIS — K5730 Diverticulosis of large intestine without perforation or abscess without bleeding: Secondary | ICD-10-CM

## 2013-10-13 DIAGNOSIS — I509 Heart failure, unspecified: Secondary | ICD-10-CM

## 2013-10-13 MED ORDER — PROMETHAZINE HCL 25 MG PO TABS: 25.0000 mg | ORAL_TABLET | Freq: Two times a day (BID) | ORAL | Status: DC | PRN

## 2013-10-13 NOTE — Patient Instructions (Signed)
Today we updated your med list in our EPIC system...    Continue your current medications the same...  Continue the Oxygen at 3L/min flow rate...    Use the nasal saline mist to keep your nasal mucosa moist...  Start an at home exercise program as we discussed (burn more)...    Work on weight reduction through decreasing your calorie consumption (eat less)  Call for any questions...  Let's plan a follow up visit in 67mo, sooner if needed for problems.Marland KitchenMarland Kitchen

## 2013-10-18 ENCOUNTER — Encounter (HOSPITAL_COMMUNITY)
Admission: RE | Admit: 2013-10-18 | Discharge: 2013-10-18 | Disposition: A | Discharge status: Home / Self Care | Payer: Medicare Other | Source: Ambulatory Visit | Attending: Pulmonary Disease | Admitting: Pulmonary Disease

## 2013-10-18 ENCOUNTER — Encounter: Payer: Self-pay | Admitting: Adult Health

## 2013-10-18 ENCOUNTER — Ambulatory Visit (INDEPENDENT_AMBULATORY_CARE_PROVIDER_SITE_OTHER): Payer: Medicare Other | Admitting: Adult Health

## 2013-10-18 ENCOUNTER — Telehealth: Payer: Self-pay | Admitting: Pulmonary Disease

## 2013-10-18 VITALS — BP 134/82 | HR 59 | Temp 98.4°F | Ht 64.0 in | Wt 233.6 lb

## 2013-10-18 DIAGNOSIS — R296 Repeated falls: Secondary | ICD-10-CM

## 2013-10-18 DIAGNOSIS — J441 Chronic obstructive pulmonary disease with (acute) exacerbation: Secondary | ICD-10-CM

## 2013-10-18 DIAGNOSIS — Z9181 History of falling: Secondary | ICD-10-CM

## 2013-10-18 DIAGNOSIS — D509 Iron deficiency anemia, unspecified: Secondary | ICD-10-CM | POA: Insufficient documentation

## 2013-10-18 LAB — POCT HEMOGLOBIN-HEMACUE: HEMOGLOBIN: 9 g/dL — AB (ref 12.0–15.0)

## 2013-10-18 MED ORDER — DARBEPOETIN ALFA-POLYSORBATE 200 MCG/0.4ML IJ SOLN
200.0000 ug | INTRAMUSCULAR | Status: DC
  Administered 2013-10-18: 200 ug via SUBCUTANEOUS

## 2013-10-18 MED ORDER — DARBEPOETIN ALFA-POLYSORBATE 200 MCG/0.4ML IJ SOLN
INTRAMUSCULAR | Status: AC
  Filled 2013-10-18: qty 0.4

## 2013-10-18 MED ORDER — PREDNISONE 10 MG PO TABS: ORAL_TABLET | ORAL | Status: DC

## 2013-10-18 NOTE — Patient Instructions (Signed)
Finish Doxycycline as directed.  Mucinex DM Twice daily  As needed  Cough/congestion.  Prednisone taper over next week.  Dr. Lenna Gilford is retiring and we will need to set you up with a new Primary Care provider.  Please let us know if you would like [place] to assist with this referral.  Please contact office for sooner follow up if symptoms do not improve or worsen or seek emergency care

## 2013-10-18 NOTE — Telephone Encounter (Signed)
Called spoke with pt. appt scheduled this afternoon w/ TP for appt. Nothing further needed

## 2013-10-19 NOTE — Progress Notes (Signed)
Subjective:    Patient ID: [name], female    DOB: 1941-05-18, 73 y.o.   MRN: [id number]  HPI 73 y/o WF with multiple med prob.   10/18/13 Acute OV  Complains of  prod cough with green mucus, head congestion, wheezing, increased SOB, some chest tightness x5 days, low grade temp at onset for 3 days. Began doxycycline 2/20 by HHN. Having wheezing last 2 days not any better. She denies any hemoptysis, orthopnea, PND, leg swelling, abdominal pain, or vomiting. CXR on 2/8 w/ NAD  Seen on office last week w/ no flare in symptoms .  Recent admission with worsening chronic anemia in setting of GAVE (Gastric antral vascular ectasia) requiring transfusion.  Hemoglobin today has improved at 9.           Problem List:  COPD (ICD-496) & BRONCHITIS, RECURRENT (ICD-491.9) - Ex-smoker w/ underlying COPD on home O2, ADVAIR 250Bid, ALBUTEROL (via nebs or MDI for Prn use), & MUCINEX 1-2Bid... she has combined obstructive AND restrictive disease (based on her obesity) w/ diet (weight reduction) + exercise strongly recommended to the pt...  ~  CTChest 5/06 = neg. ~  baseline CXR w/ bilat LL scarring, NAD.... last CXR 3/09 = unchanged, NAD. ~  PFTs 3/09 showed FVC= 2.47 (83%), FEV1= 1.65 (69%), %1sec= 67, mid-flows= 28%pred. ~  CXR 4/12 showed bilat scarring, NAD... ~  PFT 10/12 showed FVC= 2.28 (77%), FEV1= 1.46 (64%), %1sec= 64, mid-flows= 31% pred. ~  CXR 3/13 showed normal heart size, prom hilar regions, no infiltrates etc, NAD.[name]. ~  CTAngio Chest 3/13 showed neg for PE- scarring at lung bases, no adenopathy or lung lesions, calcified coronaries, multilevel spondylosis in TSpine, cirrhosis in liver ~  CXR 2/14 showed cardiomeg, no pulm edema, clear lungs, NAD ~  PFT 5/14 showed FVC=2.41 (83%), FEV1=1.77 (80%), %1sec=74, mid-flows= 61% predicted (all c/w min obstructive dis, +superimposed restriction) ~  Ambulatory O2 sats 5/14 showed 93% sat on RA at rest w/ HR=60; 87% sat after one lap on RA w/  HR=62 ~  7/14: on Home O2, Advair250, NEBS, Mucinex; remote smoker quit yrs ago; known obstructive & restrictive lung dis; CXR 6/14 showed mild cardiomeg, mild vasc congestion, linear atx at left base, otherw clear; CTA 3/13 was neg for PE & showed some atelec, coronary calcif, multilevel spondylosis, & cirrhosis of the liver; she continues on O2 by [place] at 3L/min...  ~  CXR 7/14 & 9/14 showed norm heart size, clear lungs, no edema etc..   HYPERTENSION (ICD-401.9) >>  ~  on ATENOLOL 13m/d,  LASIX 437md & K20/d...  ~  10/11:  BP=116/78 and she has mult somatic complaints but all are diminished compared to prev visits; denies bad HA's, CP, palpit, syncope, edema, etc... ~  4/12:  BP= 140/60 and she persists w/ mult somatic complaints... ~  10/12:  BP= 122/74 & she has mult somatic complaints; needs better low sodium, calorie restricted diet & wt reduction... ~  3/13:  BP= 120/72 & she is unchanged symptomatically;  EKG 3/13 showed NSR, rate68, LAD, late transition, NSSTTWA... ~  7/13:  BP= 110/62 & she persists w/ mult somatic complaints... ~  10/13:  BP= 118/60 & she remains clinically stable... ~  7/14: on Aten50, Lisinopril5, Lasix20-3/d, K10Qod; BP= 130/70 & she has mult somatic complaints but no specific CP, palpit, ch in SOB or edema; continue low sodium & wt reduction. ~  7/14: DrEllison stopped her Aten50; f/u BP on Lisin5, Lasix60, & K10Qod = 122/72... ~  9/14:  BP= 128/68 & she is improved...  CAD (ICD-414.00) - Hosp at [facility] 4/09 w/ CP... cath showed non-obstructive CAD... no CP, palpit, etc (but she is way too sedentary). ~  cath 4/09 by DrCooper showed normLMAIN, 20% midLAD, norm CIRC, 20-30% proxRCA, EF= 50-55%... ~  EKG 1/11 showed SBrady, rate54, ?septal infarct, NSSTTWA, NAD... ~  EKG 3/13 showed NSR, rate68, LAD, late transition, NSSTTWA... ~  CTAngio Chest 3/13 showed calcif coronaries as noted... ~  DrCooper 7/13> stable, no angina, too sedentary; f/u 2DEcho w/ mild LVH,  norm LVF w/ EF=55-60% & norm wall motion, mils AS & AI, Gr2DD... ~  9/14: she had f/u DrCooper w/ meds adjusted> Aten changed to Corgard, Lasix adjusted...  PALPITATIONS (CVE-938.1) >>  DIASTOLIC CHF >>  ~  eval by DrKlein w/ 7 beats WT on monitor... he changed Lisinopril to BBlocker (currently ATENOLOL 2m/d) and improved... ~  she saw DrCopper for Cards f/u 1/11- palpit well controlled on Atenolol; hx non-obstructive CAD & good LVF; no change in Rx. ~  [facility] Di[facility] and Lasix adjusted- Disch on Lasix20-3/d  AORTIC STENOSIS (ICD-424.1) - she has mild Ao Valve disease w/ AS/ AI... followed by DrCooper. ~  2DEcho 11/06 showed mild ca++ AoV w/ mild AS, norm LVF w/ EF=55-65% ~  repeat 2DEcho 1/09 showed similar mild ca++ AoV w/ mild AS, mild AI, no regional wall motion abn, norm LVf w/ EF= 60%... NOTE: est PAsys= 35... ~  repeat 2DEcho 1/10 showed norm LVF w/ EF= 55-60%, no regional wall motion abn, mild DD, mod calcif AoV & mild reduced leaflet excursion & mild AI... ~  Repeat 2DEcho 7/13 showed norm LVF w/ EF=55-65%, no regional wall motion abn, Gr2DD, mildAS, mildAI, mild LAdil... ~  DrCooper 7/13> stable, no angina, too sedentary; f/u 2DEcho w/ mild LVH, norm LVF w/ EF=55-60% & norm wall motion, mils AS & AI, Gr2DD... ~  7/14: known non-obstructive CAD; hx atypCP/ CWP; no angina & she remains way too sedentary; discussed risk factor reduction strategy & she saw DrCooper 5/14> stable, no angina, too sedentary; last 2DEcho 7/13 w/ mild LVH, norm LVF w/ EF=55-60% & norm wall motion, mild AS&AI, Gr2DD;  EKG 5/14 w/ NSR, rate62, 1st degree AVB, NSSTTWA...  HYPERCHOLESTEROLEMIA (ICD-272.0) - on SIMVASTATIN 443md... ~  FLCon[place]/09 showed TChol 107, TG 69, HDL 35, LDL 59 ~  FLP 3/10 showed TChol 128, TG 125, HDL 33, LDL 70 ~  FLP 12/10 showed TChol 98, TG 64, HDL 37, LDL 48... continue same. ~  FLP 10/11 showed TChol 124, TG 105, HDL 37, LDL 66 ~  FLP 4/12 showed TChol 122, TG 95, HDL 37,  LDL 66 ~  FLP 10/12 on Simva40 showed TChol 110, TG 116, HDL 34, LDL 53 ~  FLP 5/13 on Simva40 showed TChol 94, TG 130, HDL 32, LDL 36  DM (ICD-250.00) - hx irreg control, not really on diet... on LANTUS per drEllison,  & odd METFORMIN, GLIMEPIRIDE, JANUVIA...  ~  labs 10/08 BS=198, HgA1C=7.2 ~  labs 1/09 showed BS=119, HgA1c=6.0 ~  labs 6/09 showed BS= 248, HgA1c= 7.0... Marland Kitcheniscussed need for starting insulin if she can't get on track and get wt down... ~  labs 9/09 showed BS= 236, HgA1c= 7.6...Marland KitchenMarland Kitchenec- start Lantus 5u/d... grad increased to 10u. ~  labs 11/09 showed BS= 166, HgA1c= 6.0...Marland KitchenMarland Kitchenec- great! keep same. ~  labs 3/10 showed BS= 169, A1c= 6.8 ~  labs 12/10 showed BS= 149, A1c= 5.8, Umicroalb=  neg. ~  labs 10/11 showed BS= 215, A1c= 7.1.[name]. rec> better diet, get wt down. ~  Labs 4/12 showed BS= 172, A1c=5.8 ~  Labs 10/12 showed BS= 245, A1c= 8.1.Marland KitchenMarland Kitchen rec to take meds every day; incr Lantus dose 10==> 20u/d... ~  Labs 3/13 on Lantus20+58mds showed BS= 276, A1c= 6.8 (improved)... ~  4/13:  [facility] ~  Labs 5/13 on Lantus20+365ms showed BS= 191, A1c= 6.9 ==> subsequently BS incr & Lantus incr ~  Labs 7/13 on Lantus40+52m28m showed BS= 339, A1c= 7.4... Marland KitchenMarland Kitchenhy control is off, rec move shots around & incr by 5u/wk til BS<200. ~  Labs 10/13 on Lantus25+52me57mshowed BS= 164, A1c= 5.0... TMarland KitchenMarland Kitchen tight & rec to decr Lantus20 & decr Glimep4mg 752m1/2 tab... ~  She has been adjusting her meds on her own in the interval => referred to DrEllGraftonocrinology... ~  7/14: on Lantus20u & off all DM pills per DrEllison; needs much better diet & exercise program! Not checking BS at home; labs from 6/14 showed BS=169, A1c=6.7 & she will continue f/u w/ Endocrinology. ~  7/14 f/u DrEllison & stopped all DM pills and increased her insulin to Lantus50Bid ~  9/14:  DrEllison increased her Lantus to 200u/d...  MORBID OBESITY (ICD-278.01) - not really on diet or exercise  program... ~  weight 6/09 = 223# ~  weight 11/09 = 228# ~  weight 3/10 = 225# ~  weight 6/10 = 217# ~  weight 9/10 = 213# ~  weight 12/10 = 224# ~  weight 2/11 = 216# ~  weight 10/11 = 230# ~  Weight 4/12 = 222# ~  Weight 10/12 = 225# ~  Weight 3/13 = 215#...  great job, keep it up... 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  ~  EGD  2/06 showed angiodysplasia & mult gastric pseudopolyps, watermelon stomach improved from 2003. ~  recent f/u EGD 9/09 showed chr gastritis, no watermelon stomach & improved from prev... ~  recurrent anemia 12/10 w/ f/u EGD 2/11 showing gastric antral avm's- s/p argon laser ablation, no varicies etc... 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 ~  5/13:  She is not on PPI and c/o some abd discomfort- rec starting PROTONIX 40mg/71m ~  She saw DrDBrodie 10/13 w/ c/o dysphagia + f/u of her autoimmune liver dis, portal HTN, cirrhosis, "watermelon stomach" & hx argon laser ablation of gastric AVMs (chr GI blood loss w/ periodic Fe infusions- last 8/13); EGD 10/13 showed norm esoph mucosa, no varicies, spasm at the LES but no stricture found; dilated to 15F; mod severe portal hypertensive gastropathy in stomach, no active bleeding; REC> continue PPI, try Levsin... ~  1/14:  She had EGD & Argon Laser ablation of gastric AVM by DrDBrodie... ~  5/14: EGD showing sm varicies, portal gastropathy, mult AVMs w/ laser ablation; Hg was 7.7=> 2uPCs & improved  to 9 range; subseq capsule  endoscopy showed AVMs in upper sm intestines as well... ~  9/14:  She was Hosp by [facility] & seen by GI- drPyrtle w/ EGD (no varices, smHH, mild gastropathy, area of GAVE treated w/ APC, nodular mucosa (inflammed mucosa, neg HPylori, no dysplasia)...  DIVERTICULOSIS OF COLON (ICD-562.10) & POLYP, COLON (ICD-211.3) -  ~  last colonoscopy 10/08 w/ adenomatous polyp removed, and angiodysplasia without hemorrhage ~  CT Abd 10/08 showed Cirrhosis, borderline splenomeg, sl pancreatic atrophy & duct dil, sm umbil hernia, DJD sp, NAD... ~  10/13: she had f/u drDBrodie> last colonoscopy was 2008, she felt pt was too high risk for prep & sedation for a f/u colonoscopy & they decided to hold off...  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 ~  10/11:  presents w/ dysuria & UTI Rx w/ Cipro... ~  12/11:  She had f/u DrGrapey... ~  6/14: she was treated for a sens EColi UTI during 6/14 Hosp w/ Cipro...  OSTEOARTHRITIS (ICD-715.90)  FIBROMYALGIA (ICD-729.1) - she persists w/ mult somatic complaints...  ? of OSTEOPOROSIS (ICD-733.00) - she had normal BMD in 2001 at [place]..  RESTLESS LEGS SYNDROME (ICD-333.94) - she prev weaned off the Requip & remains on SINEMET-CR 25-100Bid... ~  9/10: now c/o recurrent restless leg symptoms... restart REQUIP 65m & titrate up. ~  12/10: persistant symptoms on 276mhs... rec to incr to 26m26m. ~  2/11: she reports resolution of RLS on 26mg19mquip Qhs, resting well now. ~  12/11:   Recurrent RLS symptoms despite the Requip & seen by DrDohmeier w/ addition of Sinemet 25/100 Bid... ~  2012:  Stable on Requip26mg 38minemet 25/100 Bid... ~  7/13:  She is once again c/o incr RLS symptoms & rec to restart REQUIP 26mg/d3m  ~  8/13: she had f/u visit w/ DrDohmeier> RLS, tremors, & insomnia; Seroquel helped for awhile, she is working w/ DrHejaImmunologistlp her problems... ~  7/14: on Requip4Qhs & Sinemet25/100Bid from DrDohmeier (RLS, Trmeor, Gait abn) w/ fair control of symptoms- her note is reviewed & they stopped the Sinemet...  DYSTHYMIA (ICD-300.4) - Hx depression w/ psychotic features and hosp 2/09 at [facility]culosis> she was prev treated by Dr. Amy SiViann Fishuttenfield for counselling...  Adm to [facility] in May09 by DrBarbSmith w/ depression and meds adjusted (see DC summary)...  Now followed [facility]jaJobe [facility]ellyVirgil [facility] and meds changed to RISPERDAL, PRISTIQUE, BUPROPION, SEROQUEL, XANAX & Nuvigil (Armodafinil)...  husb indicates that she is much better at present & continues outpt Rx... ~  MRI/ [facility]RA Brain 2/08 showed sm vessel dis & atherosclerotic changes in post circ... ~  9/10: pt indicates that she is off the Trazodone... DrHejazi added Wellbutrin ?for her RLS? ~  2011:  pt continues regular f/u w/ Psychiatry on numerous meds w/ freq med adjustments; she is requested to get notes from [facility] & bring all med bottles to her  OVs for review... ~  2012:  We have not received any notes from [facility]; pt has not complied w/ our request for med bottles to be brought to each OV... ~  2013:  We still don't have accurate list of her current meds as we have not received [facility] notes & she NEVER brings meds to office despite our requests... ~  7/14: on RX per Psychiatry DrHejazi (we don't have notes from him) & she does not know her meds, didn't bring med bottles or med list!  As  best we can tell she is on Nuvigil (on hold), Pristiq100, WellbutrinXL150, Seroquel100-4Qhs, Xanax40m prn- all from Psychiatry & she is reminded to bring all bottles to every visit...   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 ~  labs 04/18/08 by DrBrodie showed Hg= 10.4, Fe= 52... she ordered 1,20026mV Iron- given 04/26/08. ~  labs 05/19/08 showed Hg= 11.3, Fe= 117... ~  labs 11/09 showed Hg= 12.0... Marland KitchenMarland Kitchen labs 3/10 showed Hg= 12.3, Fe= 79 ~  labs 6/10 showed Hg= 11.5, Fe= 73 ~  labs 12/10 showed Hg= 7.2, Fe= 28 (6%sat)... [facility] DrBrodie in Jan2012. ~  labs 2/11 showed Hg= 11.8, Fe= 95... pt indicates that DrBrodie will be following labs going forward... ~  labs 6/11 showed Hg= 11.2 ~  labs 9/11 showed Hg= 11.1, Fe= 106 ~  Labs 4/12 showed Hg=11.0, Fe=118 ~  Labs 10/12 showed Hg=9.5, Fe=35 (7%sat)... We will arrange for outpt infusion 1000m79m... ~  Labs 12/12 showed Hg=11.4, Fe=92 (26%sat) ~  Labs 3/13 showed Hg=11.2, Fe=71 (17%sat) ~  Labs 5/13 showed Hg=9.8, Fe=53 (13%sat) ~  04/22/12:  She was given 1000mg51mIron Dextran by DrDBrodie... ~  Labs 10/13 showed Hg=11.1, Fe=76 (23%) ~  Labs 1/14 showed Hg=7.0 (down 4pts) Hct=21.9 Fe=51 (11%sat) B12=623, Protime=11.6/ 1.1, AFP=pending; Abd sonar per DrDBrodie- cirrhosis, no ascites. ~  Given IV Iron by DrDBrodie w/ Hg improved to 9.3 & Fe up to 177... ~  Subsequently her Hg settled back to 8.8 & Iron dropped to 70... The pt did not want me to recheck her labs- "DrBrodie does it" ~  Hosp [facility] w/ further GIB and had EGD, Capsule Endo, etc- see above...  We are back to checking CBC, Fe monthly to check for needed iron infusions... ~  Labs 7/14 showed Hg= 8.8 and Fe= 58 (14%) so we decided to proceed w/ IV Fe  Feraheme 510mg 74mnfusions... ~  Labs 8/14 s/p IV Feraheme x2 showed Hg= 8.8, Fe= 138 (35%sat); Rec to recheck labs Qmo... ~  9/14:  She was hosp by [facility] w/ SOB, anemia- Tx 1u, IV Fe given...   Past Surgical History  Procedure Laterality Date  . Hemorroidectomy    . Appendectomy    . Vaginal hysterectomy    . Breast biopsy      bilateral  . Cesarean section      x 3  . Appendectomy    . Esophagogastroduodenoscopy  06/12/2012    Procedure: ESOPHAGOGASTRODUODENOSCOPY (EGD);  Surgeon: Dora MLafayette Dragon [facility];  Service: Endoscopy;  Laterality: N/A;  . Balloon dilation  06/12/2012    Procedure: BALLOON DILATION;  Surgeon: Dora MLafayette Dragon Location: [facility];  Service: Endoscopy;  Laterality: N/A;  ?  balloon  . Esophagogastroduodenoscopy  09/10/2012    Procedure: ESOPHAGOGASTRODUODENOSCOPY (EGD);  Surgeon: Lafayette Dragon, MD;  [facility];  Service: Endoscopy;  Laterality: N/A;  . Hot hemostasis  09/10/2012    Procedure: HOT HEMOSTASIS (ARGON PLASMA COAGULATION/BICAP);  Surgeon: Lafayette Dragon, MD;  [facility];  Service: Endoscopy;  Laterality: N/A;  . Esophagogastroduodenoscopy N/A 01/18/2013    Procedure: ESOPHAGOGASTRODUODENOSCOPY (EGD);  Surgeon: Lafayette Dragon, MD;  Location: [facility] END[facility]COPY;  Service: Endoscopy;  Laterality: N/A;  . Esophagogastroduodenoscopy N/A 05/24/2013    Procedure: ESOPHAGOGASTRODUODENOSCOPY (EGD);  Surgeon: Jerene Bears, MD;  Location: [place];  Service: Gastroenterology;  Laterality: N/A;  . Hot hemostasis N/A 05/24/2013    Procedure: HOT HEMOSTASIS (ARGON PLASMA COAGULATION/BICAP);  Surgeon: Jerene Bears, MD;  Location: [place];  Service: Gastroenterology;  Laterality: N/A;    Outpatient Encounter Prescriptions as of 10/18/2013  Medication Sig  . albuterol (PROAIR HFA) 108 (90 BASE) MCG/ACT inhaler Inhale 2 puffs into the lungs every 6 (six) hours as needed for wheezing or shortness of breath.   . ALPRAZolam  (XANAX) 1 MG tablet Take 1 mg by mouth 3 (three) times daily as needed for anxiety.   . AMBULATORY NON FORMULARY MEDICATION Continuous O2 @@ 3LMP  . b complex vitamins tablet Take 1 tablet by mouth daily.   . B-D ULTRAFINE III SHORT PEN 31G X 8 MM MISC USE AS DIRECTED  . Blood Glucose Monitoring Suppl (ONE TOUCH ULTRA MINI) W/DEVICE KIT Test blood sugar up to three times daily  . buPROPion (WELLBUTRIN XL) 150 MG 24 hr tablet Take 150 mg by mouth daily.  [name] desvenlafaxine (PRISTIQ) 100 MG 24 hr tablet Take 100 mg by mouth daily. Takes with a 49m tablet, total daily dose is 1576m . desvenlafaxine (PRISTIQ) 50 MG 24 hr tablet Take 50 mg by mouth daily. Takes with a 10090mablet, total daily dose is 150m33m dextromethorphan (DELSYM) 30 MG/5ML liquid Take 60 mg by mouth at bedtime.   . Fluticasone-Salmeterol (ADVAIR DISKUS) 250-50 MCG/DOSE AEPB INHALE 1 PUFF INTO THE LUNGS 2 (TWO) TIMES DAILY.  . furosemide (LASIX) 20 MG tablet Take 40 mg by mouth 2 (two) times daily.   . glMarland Kitchencose blood (ONE TOUCH TEST STRIPS) test strip Test blood sugar up to three times daily  . Insulin Glargine (LANTUS SOLOSTAR [place]) Inject 140 Units into the skin daily before supper.   . insulin lispro (HUMALOG) 100 UNIT/ML KiwkPen Inject 20 Units into the skin daily with supper.  . lactulose (CHRONULAC) 10 GM/15ML solution Take 30 cc po daily  . Lancets (ONETOUCH ULTRASOFT) lancets Test blood sugar up to three times daily  . lidocaine (LIDODERM) 5 % Place 1 patch onto the skin daily as needed (apply to right shoulder or right back rib area for pain). Remove & Discard patch within 12 hours or as directed by MD  . lisinopril (PRINIVIL,ZESTRIL) 5 MG tablet Take 5 mg by mouth at bedtime.   . Multiple Vitamins-Minerals (CENTRUM SILVER PO) Take by mouth daily.  . nadolol (CORGARD) 20 MG tablet Take 0.5 tablets (10 mg total) by mouth daily.  . pantoprazole (PROTONIX) 40 MG tablet Take 1 tablet (40 mg total) by mouth 2 (two) times daily.   . polyethylene glycol (MIRALAX / GLYCOLAX) packet Take 17 g by mouth daily as needed (constipation).  . potassium chloride SA (K-DUR,KLOR-CON) 10 MEQ tablet Take 1 tablet (10 mEq total) by mouth every other day.  . promethazine (PHENERGAN)  25 MG tablet Take 1 tablet (25 mg total) by mouth 2 (two) times daily as needed for nausea.  [name] QUEtiapine (SEROQUEL) 400 MG tablet Take 400 mg by mouth at bedtime.  [name] rOPINIRole (REQUIP) 4 MG tablet Take 4 mg by mouth at bedtime.  . Saline (OCEAN NASAL SPRAY NA) Place 1 spray into the nose daily as needed (congestion).  . Simethicone (GAS-X PO) Take 1 tablet by mouth daily as needed (flatulence).   . simvastatin (ZOCOR) 40 MG tablet TAKE 1 TABLET BY MOUTH AT BEDTIME FOR CHOLESTEROL  . Vitamin D, Ergocalciferol, (DRISDOL) 50000 UNITS CAPS capsule Take 50,000 Units by mouth every 7 (seven) days. On fridays  . predniSONE (DELTASONE) 10 MG tablet 4 tabs for 2 days, then 3 tabs for 2 days, 2 tabs for 2 days, then 1 tab for 2 days, then stop  . [DISCONTINUED] darbepoetin (ARANESP) 200 MCG/0.4ML injection   . [DISCONTINUED] darbepoetin (ARANESP) injection 200 mcg     Allergies  Allergen Reactions  . Aspirin Other (See Comments)    Causes nosebleeds  . Penicillins Itching  . Theophylline Nausea And Vomiting    Current Medications, Allergies, Past Medical History, Past Surgical History, Family History, and Social History were reviewed in [employer] record.    Review of Systems         See HPI - all other systems neg except as noted...        The patient denies anorexia, fever, weight loss, vision loss, decreased hearing, hoarseness, chest pain, syncope, peripheral edema headaches, hemoptysis, abdominal pain, melena, hematochezia, severe indigestion/heartburn, hematuria, incontinence, suspicious skin lesions, transient blindness, unusual weight change, abnormal bleeding, enlarged lymph nodes, and angioedema.     Objective:   Physical  Exam     WD, Obese, 73 y/o WF in NAD... she is chr ill appearing.in wc  GENERAL:  Alert & oriented x 3... HEENT:  [place]/AT, pale conjuct, EOM-wnl, PERRLA, EACs-clear, TMs-wnl, NOSE-clear, THROAT-clear w/ dry MMs... NECK:  Supple w/ fairROM; no JVD; normal carotid impulses w/o bruits; no thyromegaly or nodules palpated; no lymphadenopathy. CHEST:  Few exp wheezes . HEART:  Regular Rhythm;  gr 1/6 SEM without rubs or gallops... ABDOMEN:  Obese, soft & nontender w/ panniculus; normal bowel sounds; no organomegaly or masses detected. EXT: without deformities, mod arthritic changes; no varicose veins/ +venous insuffic/ tr edema. NEURO:     no focal neuro deficits... DERM:  No lesions noted; no rash, pale complexion.

## 2013-10-19 NOTE — Assessment & Plan Note (Signed)
Slow to resolve flare   Plan  Finish Doxycycline as directed.  Mucinex DM Twice daily  As needed  Cough/congestion.  Prednisone taper over next week.  Please contact office for sooner follow up if symptoms do not improve or worsen or seek emergency care

## 2013-10-20 ENCOUNTER — Telehealth: Payer: Self-pay | Admitting: Pulmonary Disease

## 2013-10-20 NOTE — Telephone Encounter (Signed)
LMOM TCB x1 for [facility].

## 2013-10-20 NOTE — Telephone Encounter (Signed)
Arbie Cookey reports she saw PT this AM. Pt does not not take neb medications and have frequent resp infection. Pt has a difficult time to get mucus moving. Arbie Cookey wants to know if SN will be willing to prescribe this for pt. Please advise SN thanks  Allergies  Allergen Reactions  . Aspirin Other (See Comments)    Causes nosebleeds  . Penicillins Itching  . Theophylline Nausea And Vomiting    Current Outpatient Prescriptions on File Prior to Visit  Medication Sig Dispense Refill  . albuterol (PROAIR HFA) 108 (90 BASE) MCG/ACT inhaler Inhale 2 puffs into the lungs every 6 (six) hours as needed for wheezing or shortness of breath.       . ALPRAZolam (XANAX) 1 MG tablet Take 1 mg by mouth 3 (three) times daily as needed for anxiety.       . AMBULATORY NON FORMULARY MEDICATION Continuous O2 @@ 3LMP      . b complex vitamins tablet Take 1 tablet by mouth daily.       . B-D ULTRAFINE III SHORT PEN 31G X 8 MM MISC USE AS DIRECTED  100 each  3  . Blood Glucose Monitoring Suppl (ONE TOUCH ULTRA MINI) W/DEVICE KIT Test blood sugar up to three times daily  1 each  0  . buPROPion (WELLBUTRIN XL) 150 MG 24 hr tablet Take 150 mg by mouth daily.      [name] desvenlafaxine (PRISTIQ) 100 MG 24 hr tablet Take 100 mg by mouth daily. Takes with a 109m tablet, total daily dose is 1579m     . desvenlafaxine (PRISTIQ) 50 MG 24 hr tablet Take 50 mg by mouth daily. Takes with a 10076mablet, total daily dose is 150m38m   . dextromethorphan (DELSYM) 30 MG/5ML liquid Take 60 mg by mouth at bedtime.       . Fluticasone-Salmeterol (ADVAIR DISKUS) 250-50 MCG/DOSE AEPB INHALE 1 PUFF INTO THE LUNGS 2 (TWO) TIMES DAILY.  14 each  0  . furosemide (LASIX) 20 MG tablet Take 40 mg by mouth 2 (two) times daily.       . glMarland Kitchencose blood (ONE TOUCH TEST STRIPS) test strip Test blood sugar up to three times daily  100 each  12  . Insulin Glargine (LANTUS SOLOSTAR Bovey) Inject 140 Units into the skin daily before supper.       . insulin lispro  (HUMALOG) 100 UNIT/ML KiwkPen Inject 20 Units into the skin daily with supper.      . lactulose (CHRONULAC) 10 GM/15ML solution Take 30 cc po daily  960 mL  1  . Lancets (ONETOUCH ULTRASOFT) lancets Test blood sugar up to three times daily  100 each  12  . lidocaine (LIDODERM) 5 % Place 1 patch onto the skin daily as needed (apply to right shoulder or right back rib area for pain). Remove & Discard patch within 12 hours or as directed by MD      . lisinopril (PRINIVIL,ZESTRIL) 5 MG tablet Take 5 mg by mouth at bedtime.       . Multiple Vitamins-Minerals (CENTRUM SILVER PO) Take by mouth daily.      . nadolol (CORGARD) 20 MG tablet Take 0.5 tablets (10 mg total) by mouth daily.      . pantoprazole (PROTONIX) 40 MG tablet Take 1 tablet (40 mg total) by mouth 2 (two) times daily.  60 tablet  11  . polyethylene glycol (MIRALAX / GLYCOLAX) packet Take 17 g by mouth daily as  needed (constipation).      . potassium chloride SA (K-DUR,KLOR-CON) 10 MEQ tablet Take 1 tablet (10 mEq total) by mouth every other day.  30 tablet  1  . predniSONE (DELTASONE) 10 MG tablet 4 tabs for 2 days, then 3 tabs for 2 days, 2 tabs for 2 days, then 1 tab for 2 days, then stop  20 tablet  0  . promethazine (PHENERGAN) 25 MG tablet Take 1 tablet (25 mg total) by mouth 2 (two) times daily as needed for nausea.  30 tablet  5  . QUEtiapine (SEROQUEL) 400 MG tablet Take 400 mg by mouth at bedtime.      [name] rOPINIRole (REQUIP) 4 MG tablet Take 4 mg by mouth at bedtime.      . Saline (OCEAN NASAL SPRAY NA) Place 1 spray into the nose daily as needed (congestion).      . Simethicone (GAS-X PO) Take 1 tablet by mouth daily as needed (flatulence).       . simvastatin (ZOCOR) 40 MG tablet TAKE 1 TABLET BY MOUTH AT BEDTIME FOR CHOLESTEROL  30 tablet  6  . Vitamin D, Ergocalciferol, (DRISDOL) 50000 UNITS CAPS capsule Take 50,000 Units by mouth every 7 (seven) days. On fridays       No current facility-administered medications on file prior  to visit.

## 2013-10-20 NOTE — Telephone Encounter (Signed)
Per SN---   Ok to do the xopenex 0.63   TID via nebulizer.  Pt will also need to take the mucinex 600 mg  2 po bid.  thanks

## 2013-10-22 ENCOUNTER — Telehealth: Payer: Self-pay | Admitting: Pulmonary Disease

## 2013-10-22 ENCOUNTER — Telehealth: Payer: Self-pay | Admitting: *Deleted

## 2013-10-22 ENCOUNTER — Other Ambulatory Visit: Payer: Self-pay | Admitting: *Deleted

## 2013-10-22 DIAGNOSIS — K7460 Unspecified cirrhosis of liver: Secondary | ICD-10-CM

## 2013-10-22 DIAGNOSIS — J449 Chronic obstructive pulmonary disease, unspecified: Secondary | ICD-10-CM

## 2013-10-22 MED ORDER — LEVALBUTEROL HCL 0.63 MG/3ML IN NEBU: 0.6300 mg | INHALATION_SOLUTION | Freq: Three times a day (TID) | RESPIRATORY_TRACT | Status: DC

## 2013-10-22 NOTE — Telephone Encounter (Signed)
LMTCB for [name] 

## 2013-10-22 NOTE — Telephone Encounter (Signed)
Message copied by Hulan Saas on Fri Oct 22, 2013  9:16 AM ------      Message from: Hulan Saas      Created: Mon Oct 11, 2013  9:11 AM       Call and remind [facility] due for DB on 10/25/13.Lab in [facility]. ------

## 2013-10-22 NOTE — Telephone Encounter (Signed)
Called and spoke with  Care giver and she stated that the pt will need the nebulizer order sent in so the pt can start on her nebulizer txs.  This order has been sent in to [facility].   They are aware and nothing further is needed.

## 2013-10-22 NOTE — Telephone Encounter (Signed)
Called and left a detailed msg for Arbie Cookey with recs from SN  I have sent rx for xopenex to the number we have on file

## 2013-10-22 NOTE — Telephone Encounter (Signed)
[name] spinks leave msg on her phone voicemail of what to do [phone]

## 2013-10-22 NOTE — Telephone Encounter (Signed)
Patient will come for labs.  

## 2013-10-24 ENCOUNTER — Other Ambulatory Visit (HOSPITAL_COMMUNITY): Payer: Self-pay | Admitting: Pulmonary Disease

## 2013-10-25 ENCOUNTER — Encounter: Payer: Self-pay | Admitting: Pulmonary Disease

## 2013-10-25 NOTE — Progress Notes (Signed)
Subjective:    Patient ID: [name], female    DOB: 1940-10-28, 73 y.o.   MRN: [id number]  HPI [name] here for a follow up visit... she has mult med problems as noted below>   ~  September 01, 2012:  15moROV & [name] is c/o a URI "cold in my chest" w/ cough, discolored phlegm "diff colors at diff times", chest congestion, etc; she notes- tired all the time, HH is bothering her, BS at home betw 170-200 range... We reviewed the following medical problems during today's office visit>>     COPD> on Home O2, Advair250, NEBS, Mucinex; remote smoker quit yrs ago; known obstructive & restrictive lung dis; she had ZPak recently but persists w/ c/o discolored phlegm, no f/c/s, vague chest discomfort; last CXR 3/13 showed linear scarring left mid lung, otherw clear; CTA 3/13 was neg for PE & showed some atelec, coronary calcif, multilevel spondylosis, & cirrhosis of the liver; she declined f/u film today- we decided to treat w/ Levaquin, Mucinex, Fluids, Hycodan...    HBP> on Aten50, Lasix40, K20; BP= 118/82 & she has mult somatic complaints but no specific CP, palpit, ch in SOB or edema; continue low sodium & wt reduction.     CAD & mild AS/AI> known non-obstructive CAD; hx atypCP/ CWP; no angina & she remains way too sedentary; discussed risk factor reduction strategy & she saw DrCooper 7/13> stable, no angina, too sedentary; f/u 2DEcho w/ mild LVH, norm LVF w/ EF=55-60% & norm wall motion, mils AS & AI, Gr2DD...    Chol> on Simva40; not really on diet & we discussed this; last FLP 5/13 showed TChol 94, TG 130, HDL 32, LDL 36; continue same, needs exercise!    DM> on Lantus25, Metform1000Bid, Glimep2, Januvia100; needs much better diet & exercise program! Not checking BS at home; labs from 10/13 showed BS=164, A1c=5.0 so we decreased the Glimep from 490mto 110m31mach AM...    Obesity> way too sedentary, ?on diet & her wt is up to 209# today; we discussed diet, exercise, wt reduction strategies...    <<  Chronic GI problems followed by DrDBrodie >>    {GERD, Angiodysplasia w/ chronic GI blood loss >> see below... 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 We will continue to check labs every 110mo85moIron infusions of Hg is reduced & [name] in 48mo.38mo  DJD/ FM/ ?osteopenia/ VitD defic> she has mult somatic complaints & takes Tylenol prn; on VitD 50K weekly...    RLS> on Requip4Qhs & Sinemet25/100Bid from DrDohmeier w/ fair control of symptoms- her note is reviewed...    Anxiety/ Depression/ Psyche> on RX per Psychiatry DrHejazi (we don't have notes from him) & she does not know her meds, didn't bring med bottles or med list!  As best we can tell she is on Nuvigil, Pristiq, Wellbutrin, Risperdal, Seroquel, Xanax> all from Psychiatry & she is reminded to bring all bottles to every visit... We reviewed prob list, meds, xrays and labs> see below for updates >>  LABS 1/14:  CBC- Hg=7.0 (down 4pts) Hct=21.9 Fe=51 (11%sat) B12=623, Protime=11.6/ 1.1, AFP=pending; Abd sonar per DrDBrodie- pending...  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  DrDBrodie asked that we recheck her from the Gerty standpoint due to her SOB>>     COPD> on Home  O2, Advair250, NEBS, Mucinex; remote smoker quit yrs ago; known obstructive & restrictive lung dis; last CXR 2/14 showed cardiomeg, no pulm edema, clear lungs, NAD; CTA 3/13 was neg for PE & showed some atelec, coronary calcif, multilevel spondylosis, & cirrhosis of the liver; she saw TP 3/14 w/ incr cough, thick yellow sput, congestion & post nasal drip> took Avelox & Mucinex w/ some improvement;  We decided to evaluate further>>  PFT today showed FVC=2.41 (83%), FEV1=1.77 (80%), %1sec=[name], mid-flows= 61% predicted (all c/w min obstructive dis, +superimposed restriction)...  Ambulatory O2 sats showed 93% sat on RA at rest w/ HR=60; 87% sat after one lap on RA w/ HR=62... Other factors include 12# weight gain, weight =221#, very sedentary lifestyle, anemia (she did not want me to recheck this as it is being followed by DrDBrodie), and severe anxiety...  She thinks a lot of her SOB is from CHF siting her wt gain etc but a recent BNP 3/14 was ok at 180, she has a follow up appt w/ DrCooper soon...  I told her that in my opinion the biggest current issue (and most improvable factor) is anxiety & "chest wall muscle spasm" making her tight & hard to get a good breath "IN" as she described it; I went through my long explanation for this problems & told her that the BEST therapy for this was a "combination relaxer" like KLONOPIN & rec that she try 63m- 1/2 to 1 tab bid... All questions answered & she is agreeable to this plan... ADDENDUM >> pt called back & indicated that after reading the Pharm handout that she decided NOT to take the Klonopin, doesn't want a substitute...  ~  February 25, 2013:  263moOV & post hosp check> since I last saw [name]- she has been Adm 3 times by [facility]>>   Adm 5/25-27 for weakness assoc w/ IDA & GIB> Hx cirrhosis, angiodysplasia, and had EGD showing sm varicies, portal gastropathy, mult AVMs w/ laser ablation; Hg was 7.7=> 2uPCs & improved to 9 range; subseq capsule endoscopy showed  AVMs in upper sm intestines as well... Adm 6/9-12 for N&V assoc w/ constip & UTI (sens EColi- treated w/ Cipro) and underlying cirrhosis, portal gastropathy, angiodysplasia, etc (treated w/ PPI & monitoring of Hg & Fe- given 2uPRBCs & Fe infusions as outpt)...  Adm 02/19/81-42r DiastolicCHF (CXR- vasc congestion; 2DEcho- EF=55-60%, Gr2DD, mildAS/AI; Lasix added)...    COPD> on Home O2, Advair250, NEBS, Mucinex; remote smoker quit yrs ago; known obstructive & restrictive lung dis; CXR 6/14 showed mild cardiomeg, mild vasc congestion, linear atx at left base, otherw clear; CTA 3/13 was neg for PE & showed some atelec, coronary calcif, multilevel spondylosis, & cirrhosis of  the liver; she continues on O2 by [place] at 3L/min...     HBP> on Aten50, Lisinopril5, Lasix20-3/d, K10Qod; BP= 130/70 & she has mult somatic complaints but no specific CP, palpit, ch in SOB or edema; continue low sodium & wt reduction.     CAD, DiastolicCHF, & mild AS/AI> known non-obstructive CAD; hx atypCP/ CWP; no angina & she remains way too sedentary; discussed risk factor reduction strategy & she saw DrCooper 5/14> stable, no angina, too sedentary; last 2DEcho 7/13 w/ mild LVH, norm LVF w/ EF=55-60% & norm wall motion, mild AS&AI, Gr2DD;  EKG 5/14 w/ NSR, rate62, 1st degree AVB, NSSTTWA...    Chol> on Simva40; not really on diet & we discussed this; last FLP 5/13 showed TChol 94, TG 130, HDL 32, LDL 36; continue same, needs exercise!    DM> on Lantus20u & off all DM pills per DrEllison; needs much better diet & exercise program! Not checking BS at home; labs from 6/14 showed BS=169, A1c=6.7 & she will continue f/u w/ Endocrinology...    Obesity> way too sedentary, ?on diet & her wt is 218# today; we discussed diet, exercise, wt reduction strategies...    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    DJD/ FM/ ?osteopenia/ VitD defic> she has mult somatic complaints & takes Tylenol prn; on MVI + VitD 50K weekly...    RLS> on Requip4Qhs & Sinemet25/100Bid from DrDohmeier w/ fair control of symptoms- her note is reviewed...    Anxiety/ Depression/ PsycheHx> on RX per Psychiatry DrHejazi (we don't have notes from him) & she does not know her meds, didn't bring med bottles or med list!  As best we can tell she is on Nuvigil (on hold), Pristiq100, WellbutrinXL150, Seroquel100-4Qhs, Xanax56m prn- all from Psychiatry & she is reminded to bring all bottles to every visit... We reviewed prob list, meds, xrays and labs> see below for updates >>  LABS 7/14:  CBC- Hg=8.8, MCV=93, WBC=3.2, Plat=74K;  Fe=58 (14%sat), Ferritin=12.5..Marland KitchenMarland KitchenNOTE>> Fe is dropping & we decided to infuse FERAHEME [id number] as outpt... [name] 57m9mo~  March 29, 2013:  57mo43mo & Mercades received her IV Feraheme 510mg48msince our last visit; still c/o no energy & SOB; she went to the ER 1wk ago w/ UTI- given Cipro but cult returned EColi resist Cipro & switched to Keflex (sens);  Due for f/u labs today>> We reviewed prob list, meds, xrays and labs> see below for updates >>  CXR 7/14 showed norm heart size, clear lungs, no edema etc... LABS 8/14:  Chems- ok x BS=257 Cr=1.4;  CBC- Hg=8.8 Fe=138 (35%sat)...  Rec>> needs f/u appt w/ DrEllison for endocrine & plan f/u labs monthly...  ~  May 31, 2013:  65mo R53mo since I last saw MarionGeorgeanas been to the ER twice, Hosp once, seen by Cards x3, GI x2, and DrEllison x2> complex story & a lot to get caught up on...    [facility] 05[facility]/13 by [facility] w/ incr SOB & DOE, found to have Hg=7.7 w/  pos stools, had some nausea but no abd pain; given one unit, given IV Iron; &  she had EGD by DrPyrtle- no varices, smHH, mild gastropathy, area of GAVE treated w/ APC, nodular mucosa (inflammed mucosa, neg HPylori, no dysplasia); Rec rx w/ PPI Bid & Carafate Qid, Aten changed to Corgard for better reduction in portal pressure...  CARDS>  BP stable at 128/68; she had f/u Cards- DrCooper 9/14> nonobstructive CAD, HBP, mildAS, diastolicCHF, HL, morbid obesity; Lasix had been added & adjusted, she is NYHA III, otherw no changes... DM>  Unfortunately her wt is up 14# to 229# today; followed by DrEllison & seen 9/14> DM, neuropathy, CAD; A1c interpret influenced by transfusions; they increased her Lantus to 200u daily...  GI>  Followed by DrDBrodie & DrPyrtle- EGD as above, she had EGD w/ APC for her GAVE... ANEMIA> she received Tx 1u and IV Iron; Hg stabilized ~9...  ~  July 01, 2013:  72moROV but in the interval MLakhiahas had one ER visit (c/o SOB but had "a myriad of complaints"; no acute abn on her w/u w/ CXR, EKG, Labs; disch on same meds)... She saw DrEllison for DM f/u- her sugars ranged 90-400 & A1c=8.0; he changed her insulin dosing to an evening shot... She saw DrDBrodie for GI> hx advanced liver dis from autoimmune hepatitis, ?steatohepatitis, portal hypertension & gastropathy w/ GI blood loss, ?GAVE w/ prev ablation of angodysplasia; they followed up on her lab work & plan to check monthly CBC, Fe... She saw Urology, DrGrapey 10/14 & they noted some mild voiding symptoms and hx recurrent cystitis w/ "a completely pos ROS"; they rec treating UTI only id symptomatic w/ a urinary pathogen...     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 She is also c/o increased cough, congestion & yellow sput x2wks, says she fell & hit right side w/ some right rib  soreness; Recently treated w/ Levaquin but worried that she has pneumonia (CXR today is clear, ?atx in RML, and rib films w/ ?crack in right 7th rib?)...  We reviewed prob list, meds, xrays and labs> extensive med list reviewed w/ pt but she did not bring her med bottles...  ~  October 13, 2013:  366moOV & Phylis has been followed by DrDBrodie & was re-hosp 2/8 - 10/04/13 by [facility] w/ symptomatic anemia, iron deficiency, & her mult chronic medical issues; she was transfused & given IV iron and she has had f/u OV by GI- DrDBrodie who has ordered f/u blood counts and iron levels under her direction; [facility]... [facility] "we talk about my problems, she does blood work, and she communicates w/ DrBrodie"...  Pt has also had f/u visits w/ [facility], and DrEllison for DM/ Endocrine...     COPD> on Home O2, Advair250, NEBS, Mucinex; remote smoker quit yrs ago; known obstructive & restrictive lung dis; CXR 2/15 showed mild cardiomeg, mild vasc congestion, left mid lung scarring & peribronch thickening, otherw clear/NAD; CTA 3/13 was neg for PE & showed some atelec, coronary calcif, multilevel spondylosis, & cirrhosis of the liver; she continues on O2 by [place] at 3L/min...Marland KitchenMarland Kitchen  HBP> on Corgard20-1/2, Lisinopril5, Lasix20-2Bid, K10Qod; BP= 122/68 & she has mult somatic complaints but no specific CP, palpit, ch in SOB or edema; continue low sodium & wt reduction.     CAD, DiastolicCHF, & mild AS/AI> known non-obstructive CAD; hx atypCP/ CWP; no angina & she remains way too sedentary; discussed risk factor reduction strategy & she saw ScottWeaver 12/14> fragile status, chr dyspnea, no angina, too sedentary; last 2DEcho 7/13 w/ mild LVH, norm LVF w/ EF=55-60% & norm wall motion, mild AS&AI, Gr2DD;  EKG 12/14 w/ SBrady, rate47, 1st degree AVB, NSSTTWA; Corgard decr from 20=>69m/d...    Chol> on Simva40; not  really on diet & we discussed this; last FLP 5/13 showed TChol 94, TG 130, HDL 32, LDL 36; continue same, needs exercise, & ret for FLP f/u...    DM> on Lantus&Humalog per DrEllison; needs much better diet & exercise program! Not checking BS at home; labs from [id number] [place]shows BS=130-260 and last A1c was 1/15= 6.5    Obesity> way too sedentary, ?on diet & her wt is 230# today; we discussed diet, exercise, wt reduction strategies...    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    DJD/ FM/ ?osteopenia/ VitD defic> she has mult somatic complaints & takes Tylenol prn; on MVI + VitD 50K weekly...    RLS> on Requip4Qhs & off Sinemet from DrDohmeier w/ fair control of symptoms- prev neuro note is reviewed...    Anxiety/ Depression/ PsycheHx> on RX per Psychiatry DrHejazi (we don't have notes from him) & she does not know her meds, didn't bring med bottles or med list!  As best we can tell she is on Pristiq100, WellbutrinXL150, Seroquel100-4Qhs, Xanax118mprn- all from Psychiatry & she is reminded to bring all bottles to every visit... We reviewed prob list, meds, xrays and labs> see below for updates >> I will continue to eval & treat her COPD, chr resp issues as I transition out of my primary care roll...            Problem List:  COPD (ICD-496) & BRONCHITIS, RECURRENT (ICD-491.9) - Ex-smoker w/ underlying COPD on home O2, ADVAIR 250Bid, ALBUTEROL (via nebs or MDI for Prn use), & MUCINEX 1-2Bid... she has combined obstructive AND restrictive disease (based on her obesity) w/ diet (weight reduction) + exercise strongly recommended to the pt...  ~   CTChest 5/06 = neg. ~  baseline CXR w/ bilat LL scarring, NAD.... last CXR 3/09 = unchanged, NAD. ~  PFTs 3/09 showed FVC= 2.47 (83%), FEV1= 1.65 (69%), %1sec= 67, mid-flows= 28%pred. ~  CXR 4/12 showed bilat scarring, NAD... ~  PFT 10/12 showed FVC= 2.28 (77%), FEV1= 1.46 (64%), %1sec= 64, mid-flows= 31% pred. ~  CXR 3/13 showed normal heart size, prom hilar regions, no infiltrates etc, NAD...[name]~  CTAngio Chest 3/13 showed neg for PE- scarring at lung bases, no adenopathy or lung lesions, calcified coronaries, multilevel spondylosis in TSpine, cirrhosis in liver ~  CXR 2/14 showed cardiomeg, no pulm edema, clear lungs, NAD ~  PFT 5/14 showed FVC=2.41 (83%), FEV1=1.77 (80%), %1sec=[name], mid-flows= 61%  predicted (all c/w min obstructive dis, +superimposed restriction) ~  Ambulatory O2 sats 5/14 showed 93% sat on RA at rest w/ HR=60; 87% sat after one lap on RA w/ HR=62 ~  7/14: on Home O2, Advair250, NEBS, Mucinex; remote smoker quit yrs ago; known obstructive & restrictive lung dis; CXR 6/14 showed mild cardiomeg, mild vasc congestion, linear atx at left base, otherw clear; CTA 3/13 was neg for PE & showed some atelec, coronary calcif, multilevel spondylosis, & cirrhosis of the liver; she continues on O2 by [place] at 3L/min...  ~  CXR 7/14 & 9/14 showed norm heart size, clear lungs, no edema etc..  ~  2/15: on Home O2, Advair250, NEBS, Mucinex; remote smoker quit yrs ago; known obstructive & restrictive lung dis; CXR 2/15 showed mild cardiomeg, mild vasc congestion, left mid lung scarring & peribronch thickening, otherw clear/NAD; CTA 3/13 was neg for PE & showed some atelec, coronary calcif, multilevel spondylosis, & cirrhosis of the liver; she continues on O2 by [place] at 3L/min...   HYPERTENSION (ICD-401.9) >>  ~  on ATENOLOL 110m/d,  LASIX 453md & K20/d...  ~  10/11:  BP=116/78 and she has mult somatic complaints but all are diminished compared to prev visits; denies bad HA's, CP, palpit, syncope, edema,  etc... ~  4/12:  BP= 140/60 and she persists w/ mult somatic complaints... ~  10/12:  BP= 122/[name] & she has mult somatic complaints; needs better low sodium, calorie restricted diet & wt reduction... ~  3/13:  BP= 120/72 & she is unchanged symptomatically;  EKG 3/13 showed NSR, rate68, LAD, late transition, NSSTTWA... ~  7/13:  BP= 110/62 & she persists w/ mult somatic complaints... ~  10/13:  BP= 118/60 & she remains clinically stable... ~  7/14: on Aten50, Lisinopril5, Lasix20-3/d, K10Qod; BP= 130/70 & she has mult somatic complaints but no specific CP, palpit, ch in SOB or edema; continue low sodium & wt reduction. ~  7/14: DrEllison stopped her Aten50; f/u BP on Lisin5, Lasix60, & K10Qod = 122/72... ~  9/14:  BP= 128/68 & she is improved...  CAD (ICD-414.00) [facility]... cath showed non-obstructive CAD... no CP, palpit, etc (but she is way too sedentary). ~  cath 4/09 by DrCooper showed normLMAIN, 20% midLAD, norm CIRC, 20-30% proxRCA, EF= 50-55%... ~  EKG 1/11 showed SBrady, rate54, ?septal infarct, NSSTTWA, NAD... ~  EKG 3/13 showed NSR, rate68, LAD, late transition, NSSTTWA... ~  CTAngio Chest 3/13 showed calcif coronaries as noted... ~  DrCooper 7/13> stable, no angina, too sedentary; f/u 2DEcho w/ mild LVH, norm LVF w/ EF=55-60% & norm wall motion, mils AS & AI, Gr2DD... ~  9/14: she had f/u DrCooper w/ meds adjusted> Aten changed to Corgard, Lasix adjusted...  PALPITATIONS (ICRXV-400.8>>  DIASTOLIC CHF >>  ~  eval by DrKlein w/ 7 beats WT on monitor... he changed Lisinopril to BBlocker (currently ATENOLOL 5055m) and improved... ~  she saw DrCopper for Cards f/u 1/11- palpit well controlled on Atenolol; hx non-obstructive CAD & good LVF; no change in Rx. ~  [facility] and Lasix [facility]- Disch on Lasix20-3/d  AORTIC STENOSIS (ICD-424.1) - she has mild Ao Valve disease w/ AS/ AI... followed by DrCooper. ~  2DEcho 11/06 showed mild ca++ AoV w/ mild  AS, norm LVF w/ EF=55-65% ~  repeat 2DEcho 1/09 showed similar mild ca++ AoV w/ mild AS, mild AI, no regional wall motion abn, norm LVf w/ EF= 60%... NOTE: est  PAsys= 35... ~  repeat 2DEcho 1/10 showed norm LVF w/ EF= 55-60%, no regional wall motion abn, mild DD, mod calcif AoV & mild reduced leaflet excursion & mild AI... ~  Repeat 2DEcho 7/13 showed norm LVF w/ EF=55-65%, no regional wall motion abn, Gr2DD, mildAS, mildAI, mild LAdil... ~  DrCooper 7/13> stable, no angina, too sedentary; f/u 2DEcho w/ mild LVH, norm LVF w/ EF=55-60% & norm wall motion, mils AS & AI, Gr2DD... ~  7/14: known non-obstructive CAD; hx atypCP/ CWP; no angina & she remains way too sedentary; discussed risk factor reduction strategy & she saw DrCooper 5/14> stable, no angina, too sedentary; last 2DEcho 7/13 w/ mild LVH, norm LVF w/ EF=55-60% & norm wall motion, mild AS&AI, Gr2DD;  EKG 5/14 w/ NSR, rate62, 1st degree AVB, NSSTTWA...  HYPERCHOLESTEROLEMIA (ICD-272.0) - on SIMVASTATIN 67m/d... ~  [place] showed TChol 107, TG 69, HDL 35, LDL 59 ~  FLP 3/10 showed TChol 128, TG 125, HDL 33, LDL 70 ~  FLP 12/10 showed TChol 98, TG 64, HDL 37, LDL 48... continue same. ~  FLP 10/11 showed TChol 124, TG 105, HDL 37, LDL 66 ~  FLP 4/12 showed TChol 122, TG 95, HDL 37, LDL 66 ~  FLP 10/12 on Simva40 showed TChol 110, TG 116, HDL 34, LDL 53 ~  FLP 5/13 on Simva40 showed TChol 94, TG 130, HDL 32, LDL 36  DM (ICD-250.00) - hx irreg control, not really on diet... on LANTUS per drEllison,  & odd METFORMIN, GLIMEPIRIDE, JANUVIA...  ~  labs 10/08 BS=198, HgA1C=7.2 ~  labs 1/09 showed BS=119, HgA1c=6.0 ~  labs 6/09 showed BS= 248, HgA1c= 7.0...Marland Kitchendiscussed need for starting insulin if she can't get on track and get wt down... ~  labs 9/09 showed BS= 236, HgA1c= 7.6..Marland KitchenMarland Kitchenrec- start Lantus 5u/d... grad increased to 10u. ~  labs 11/09 showed BS= 166, HgA1c= 6.0..Marland KitchenMarland Kitchenrec- great! keep same. ~  labs 3/10 showed BS= 169, A1c= 6.8 ~  labs 12/10  showed BS= 149, A1c= 5.8, Umicroalb= neg. ~  labs 10/11 showed BS= 215, A1c= 7.1..[name] rec> better diet, get wt down. ~  Labs 4/12 showed BS= 172, A1c=5.8 ~  Labs 10/12 showed BS= 245, A1c= 8.1..Marland KitchenMarland Kitchenrec to take meds every day; incr Lantus dose 10==> 20u/d... ~  Labs 3/13 on Lantus20+337ms showed BS= 276, A1c= 6.8 (improved)... ~  4/13:  [facility] ~  Labs 5/13 on Lantus20+78m69m showed BS= 191, A1c= 6.9 ==> subsequently BS incr & Lantus incr ~  Labs 7/13 on Lantus40+78me16mshowed BS= 339, A1c= 7.4... ?Marland KitchenMarland Kitcheny control is off, rec move shots around & incr by 5u/wk til BS<200. ~  Labs 10/13 on Lantus25+78med72mhowed BS= 164, A1c= 5.0... ToMarland KitchenMarland Kitchentight & rec to decr Lantus20 & decr Glimep4mg t9m/2 tab... ~  She has been adjusting her meds on her own in the interval => referred to DrElliYpsilanticrinology... ~  7/14: on Lantus20u & off all DM pills per DrEllison; needs much better diet & exercise program! Not checking BS at home; labs from 6/14 showed BS=169, A1c=6.7 & she will continue f/u w/ Endocrinology. ~  7/14 f/u DrEllison & stopped all DM pills and increased her insulin to Lantus50Bid ~  9/14:  DrEllison increased her Lantus to 200u/d...  MORBID OBESITY (ICD-278.01) - not really on diet or exercise program... ~  weight 6/09 = 223# ~  weight 11/09 = 228# ~  weight 3/10 = 225# ~  weight 6/10 = 217# ~  weight 9/10 = 213# ~  weight 12/10 = 224# ~  weight 2/11 = 216# ~  weight 10/11 = 230# ~  Weight 4/12 = 222# ~  Weight 10/12 = 225# ~  Weight 3/13 = 215#...  great job, keep it up... 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  ~   EGD  2/06 showed angiodysplasia & mult gastric pseudopolyps, watermelon stomach improved from 2003. ~  recent f/u EGD 9/09 showed chr gastritis, no watermelon stomach & improved from prev... ~  recurrent anemia 12/10 w/ f/u EGD 2/11 showing gastric antral avm's- s/p argon laser ablation, no varicies etc... 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 ~  5/13:  She is not on PPI and c/o some abd discomfort- rec starting PROTONIX 67m/d... ~  She saw DrDBrodie 10/13 w/ c/o dysphagia + f/u of her autoimmune liver dis, portal HTN, cirrhosis, "watermelon stomach" & hx argon laser ablation of gastric AVMs (chr GI blood loss w/ periodic Fe infusions- last 8/13); EGD 10/13 showed norm esoph mucosa, no varicies, spasm at the LES but no stricture found; dilated to 70F; mod severe portal hypertensive gastropathy in stomach, no active bleeding; REC> continue PPI, try Levsin... ~  1/14:  She had EGD & Argon Laser ablation of gastric AVM by DrDBrodie... 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 ~  9/14:  She was Hosp by [facility] & seen by GI- drPyrtle w/ EGD (no varices, smHH, mild gastropathy, area of GAVE treated w/ APC, nodular mucosa (inflammed mucosa, neg HPylori, no dysplasia)...  DIVERTICULOSIS OF COLON (ICD-562.10) & POLYP, COLON (ICD-211.3) -  ~  last colonoscopy 10/08 w/ adenomatous polyp removed, and angiodysplasia without hemorrhage ~  CT Abd 10/08 showed Cirrhosis, borderline splenomeg, sl pancreatic atrophy & duct dil, sm umbil hernia, DJD sp, NAD... ~  10/13: she had f/u drDBrodie> last colonoscopy was 2008, she felt pt was too high risk for prep & sedation for  a f/u colonoscopy & they decided to hold off...  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 ~  10/11:  presents w/ dysuria & UTI Rx w/ Cipro... ~  12/11:  She had f/u DrGrapey... ~  6/14: she was treated for a sens EColi UTI  during 6/14 Hosp w/ Cipro...  OSTEOARTHRITIS (ICD-715.90)  FIBROMYALGIA (ICD-729.1) - she persists w/ mult somatic complaints...  ? of OSTEOPOROSIS (ICD-733.00) - she had normal BMD in 2001 at [place]..  RESTLESS LEGS SYNDROME (ICD-333.94) - she prev weaned off the Requip & remains on SINEMET-CR 25-100Bid... ~  9/10: now c/o recurrent restless leg symptoms... restart REQUIP 46m & titrate up. ~  12/10: persistant symptoms on 221mhs... rec to incr to 67m10m. ~  2/11: she reports resolution of RLS on 67mg81mquip Qhs, resting well now. ~  12/11:  Recurrent RLS symptoms despite the Requip & seen by DrDohmeier w/ addition of Sinemet 25/100 Bid... ~  2012:  Stable on Requip67mg 59minemet 25/100 Bid... ~  7/13:  She is once again c/o incr RLS symptoms & rec to restart REQUIP 67mg/d16m  ~  8/13: she had f/u visit w/ DrDohmeier> RLS, tremors, & insomnia; Seroquel helped for awhile, she is working w/ DrHejaImmunologistlp her problems... ~  7/14: on Requip4Qhs & Sinemet25/100Bid from DrDohmeier (RLS, Trmeor, Gait abn) w/ fair control of symptoms- her note is reviewed & they stopped the Sinemet...  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 Amy SiViann [place] for counselling...  Adm to [facility] in May09 by DrBarbSmith w/ depression and meds adjusted (see DC summary)...  Now followed [facility]Jobe [facility] and meds changed to RISPERDAL, PRISTIQUE, BUPROPION, SEROQUEL, XANAX & Nuvigil (Armodafinil)...  husb indicates that she is much better at present & continues outpt Rx... ~  MRI/ MRA Brain 2/08 showed sm vessel dis & atherosclerotic changes in post circ... ~  9/10: pt indicates that she is off the Trazodone... DrHejazi added Wellbutrin ?for her RLS? ~  2011:  pt continues regular f/u w/ Psychiatry on numerous meds w/ freq med adjustments; she is requested to get notes from [facility]He[facility] Specialty Surgeryr attention & bring all med bottles to her OVs for review... ~  2012:  We have not received any notes from [facility]; pt has not complied w/ our request for med bottles to be brought to each OV... ~  2013:  We still don't have accurate list of her current meds as we have not received [facility] notes & she NEVER brings meds to office despite our requests... ~  7/14: on RX per Psychiatry DrHejazi (we don't have notes from him) & she does not know her meds, didn't bring med bottles or med list!  As best we can tell she is on Nuvigil (on hold), Pristiq100, WellbutrinXL150, Seroquel100-4Qhs, Xanax1mg pr20mall from Psychiatry & she is reminded to bring all bottles to every visit...   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 ~  labs 04/18/08 by DrBrodie showed Hg= 10.4, Fe= 52... she ordered 1,200mg IV 17m- given 04/26/08. ~  labs 05/19/08 showed Hg= 11.3, Fe= 117... ~  labs 11/09 showed Hg= 12.0... ~  labMarland KitchenMarland Kitchen3/10 showed Hg= 12.3, Fe= 79 ~  labs 6/10  showed Hg= 11.5, Fe= 73 ~  labs 12/10 showed Hg= 7.2, Fe= 28 (6%sat)... [facility]. ~  labs 2/11 showed Hg= 11.8, Fe= 95... pt indicates that DrBrodie will be  following labs going forward... ~  labs 6/11 showed Hg= 11.2 ~  labs 9/11 showed Hg= 11.1, Fe= 106 ~  Labs 4/12 showed Hg=11.0, Fe=118 ~  Labs 10/12 showed Hg=9.5, Fe=35 (7%sat)... We will arrange for outpt infusion 1030m Fe... ~  Labs 12/12 showed Hg=11.4, Fe=92 (26%sat) ~  Labs 3/13 showed Hg=11.2, Fe=71 (17%sat) ~  Labs 5/13 showed Hg=9.8, Fe=53 (13%sat) ~  04/22/12:  She was given 10093mIV Iron Dextran by DrDBrodie... ~  Labs 10/13 showed Hg=11.1, Fe=76 (23%) ~  Labs 1/14 showed Hg=7.0 (down 4pts) Hct=21.9 Fe=51 (11%sat) B12=623, Protime=11.6/ 1.1, AFP=pending; Abd sonar per DrDBrodie- cirrhosis, no ascites. ~  Given IV Iron by DrDBrodie w/ Hg improved to 9.3 & Fe up to 177... ~  Subsequently her Hg settled back to 8.8 & Iron dropped to 70... The pt did not want me to recheck her labs- "DrBrodie does it" ~  [facility] and had EGD, Capsule Endo, etc- see above...  We are back to checking CBC, Fe monthly to check for needed iron infusions... ~  Labs 7/14 showed Hg= 8.8 and Fe= 58 (14%) so we decided to proceed w/ IV Fe Feraheme 51075m2 infusions... ~  Labs 8/14 s/p IV Feraheme x2 showed Hg= 8.8, Fe= 138 (35%sat); Rec to recheck labs Qmo... ~  9/14:  She was hosp by [facility] w/ SOB, anemia- Tx 1u, IV Fe given...   Past Surgical History  Procedure Laterality Date  . Hemorroidectomy    . Appendectomy    . Vaginal hysterectomy    . Breast biopsy      bilateral  . Cesarean section      x 3  . Appendectomy    . Esophagogastroduodenoscopy  06/12/2012    Procedure: ESOPHAGOGASTRODUODENOSCOPY (EGD);  Surgeon: DorLafayette DragonD;  [facility];  Service: Endoscopy;  Laterality: N/A;  . Balloon dilation  06/12/2012    Procedure: BALLOON DILATION;  Surgeon: DorLafayette DragonD;   [facility];  Service: Endoscopy;  Laterality: N/A;  ?balloon  . Esophagogastroduodenoscopy  09/10/2012    Procedure: ESOPHAGOGASTRODUODENOSCOPY (EGD);  Surgeon: DorLafayette DragonD;  [facility];  Service: Endoscopy;  Laterality: N/A;  . Hot hemostasis  09/10/2012    Procedure: HOT HEMOSTASIS (ARGON PLASMA COAGULATION/BICAP);  Surgeon: DorLafayette DragonD;  [facility];  Service: Endoscopy;  Laterality: N/A;  . Esophagogastroduodenoscopy N/A 01/18/2013    Procedure: ESOPHAGOGASTRODUODENOSCOPY (EGD);  Surgeon: DorLafayette DragonD;  [facility];  Service: Endoscopy;  Laterality: N/A;  . Esophagogastroduodenoscopy N/A 05/24/2013    Procedure: ESOPHAGOGASTRODUODENOSCOPY (EGD);  Surgeon: JayJerene BearsD;  [facility];  Laterality: N/A;  . Hot hemostasis N/A 05/24/2013    Procedure: HOT HEMOSTASIS (ARGON PLASMA COAGULATION/BICAP);  Surgeon: JayJerene BearsD;  [facility];  Laterality: N/A;    Outpatient Encounter Prescriptions as of 10/13/2013  Medication Sig  . albuterol (PROAIR HFA) 108 (90 BASE) MCG/ACT inhaler Inhale 2 puffs into the lungs every 6 (six) hours as needed for wheezing or shortness of breath.   . ALPRAZolam (XANAX) 1 MG tablet Take 1 mg by mouth 3 (three) times daily as needed for anxiety.   . AMBULATORY NON FORMULARY MEDICATION Continuous O2 @@ 3LMP  . b complex vitamins tablet Take 1 tablet by mouth daily.   . B-D ULTRAFINE III SHORT PEN 31G X 8 MM MISC USE AS DIRECTED  . Blood Glucose Monitoring  Suppl (ONE TOUCH ULTRA MINI) W/DEVICE KIT Test blood sugar up to three times daily  . buPROPion (WELLBUTRIN XL) 150 MG 24 hr tablet Take 150 mg by mouth daily.  [name] desvenlafaxine (PRISTIQ) 100 MG 24 hr tablet Take 100 mg by mouth daily. Takes with a 47m tablet, total daily dose is 1559m . desvenlafaxine (PRISTIQ) 50 MG 24 hr tablet Take 50 mg by mouth daily. Takes with a 10050mtablet, total daily dose is 150m52m dextromethorphan (DELSYM) 30 MG/5ML liquid Take 60 mg by mouth at bedtime.   . Fluticasone-Salmeterol (ADVAIR DISKUS) 250-50 MCG/DOSE AEPB INHALE 1 PUFF INTO THE LUNGS 2 (TWO) TIMES DAILY.  . furosemide (LASIX) 20 MG tablet Take 40 mg by mouth 2 (two) times daily.   . glMarland Kitchencose blood (ONE TOUCH TEST STRIPS) test strip Test blood sugar up to three times daily  . Insulin Glargine (LANTUS SOLOSTAR [place]) Inject 140 Units into the skin daily before supper.   . insulin lispro (HUMALOG) 100 UNIT/ML KiwkPen Inject 20 Units into the skin daily with supper.  . lactulose (CHRONULAC) 10 GM/15ML solution Take 30 cc po daily  . Lancets (ONETOUCH ULTRASOFT) lancets Test blood sugar up to three times daily  . lidocaine (LIDODERM) 5 % Place 1 patch onto the skin daily as needed (apply to right shoulder or right back rib area for pain). Remove & Discard patch within 12 hours or as directed by MD  . lisinopril (PRINIVIL,ZESTRIL) 5 MG tablet Take 5 mg by mouth at bedtime.   . Multiple Vitamins-Minerals (CENTRUM SILVER PO) Take by mouth daily.  . nadolol (CORGARD) 20 MG tablet Take 0.5 tablets (10 mg total) by mouth daily.  . pantoprazole (PROTONIX) 40 MG tablet Take 1 tablet (40 mg total) by mouth 2 (two) times daily.  . polyethylene glycol (MIRALAX / GLYCOLAX) packet Take 17 g by mouth daily as needed (constipation).  . potassium chloride SA (K-DUR,KLOR-CON) 10 MEQ tablet Take 1 tablet (10 mEq total) by mouth every other day.  . promethazine (PHENERGAN) 25 MG tablet Take 1 tablet (25 mg total) by mouth 2 (two) times daily as needed for nausea.  . QUMarland Kitchentiapine (SEROQUEL) 400 MG tablet Take 400 mg by mouth at bedtime.  . rOMarland KitchenINIRole (REQUIP) 4 MG tablet Take 4 mg by mouth at bedtime.  . Saline (OCEAN NASAL SPRAY NA) Place 1 spray into the nose daily as needed (congestion).  . Simethicone (GAS-X PO) Take 1 tablet by mouth daily as needed (flatulence).   . simvastatin (ZOCOR) 40 MG tablet  TAKE 1 TABLET BY MOUTH AT BEDTIME FOR CHOLESTEROL  . Vitamin D, Ergocalciferol, (DRISDOL) 50000 UNITS CAPS capsule Take 50,000 Units by mouth every 7 (seven) days. On fridays  . [DISCONTINUED] promethazine (PHENERGAN) 25 MG tablet Take 1 tablet (25 mg total) by mouth 2 (two) times daily as needed for nausea.    Allergies  Allergen Reactions  . Aspirin Other (See Comments)    Causes nosebleeds  . Penicillins Itching  . Theophylline Nausea And Vomiting    Current Medications, Allergies, Past Medical History, Past Surgical History, Family History, and Social History were reviewed in [facility].    Review of Systems         See HPI - all other systems neg except as noted...      The patient complains of weight gain, dyspnea on exertion, muscle weakness, difficulty walking, and depression.  The patient denies anorexia, fever, weight loss, vision loss, decreased hearing,  hoarseness, chest pain, syncope, peripheral edema, prolonged cough, headaches, hemoptysis, abdominal pain, melena, hematochezia, severe indigestion/heartburn, hematuria, incontinence, suspicious skin lesions, transient blindness, unusual weight change, abnormal bleeding, enlarged lymph nodes, and angioedema.     Objective:   Physical Exam     WD, Obese, 73 y/o [name] in NAD... she is chr ill appearing... GENERAL:  Alert & oriented x 3... HEENT:  [place]/AT, pale conjuct, EOM-wnl, PERRLA, EACs-clear, TMs-wnl, NOSE-clear, THROAT-clear w/ dry MMs... NECK:  Supple w/ fairROM; no JVD; normal carotid impulses w/o bruits; no thyromegaly or nodules palpated; no lymphadenopathy. CHEST:  Clear to P & A; without wheezes/ rales/ or rhonchi heard... HEART:  Regular Rhythm;  gr 1/6 SEM without rubs or gallops... ABDOMEN:  Obese, soft & nontender w/ panniculus; normal bowel sounds; no organomegaly or masses detected. EXT: without deformities, mod arthritic changes; no varicose veins/ +venous insuffic/ tr  edema. NEURO:  CN's intact;  no focal neuro deficits... DERM:  No lesions noted; no rash, pale complexion.  RADIOLOGY DATA:  Reviewed in the EPIC EMR & discussed w/ the patient...  LABORATORY DATA:  Reviewed in the EPIC EMR & discussed w/ the patient...   Assessment & Plan:    DYSPNEA> clearly this is multifactorial w/ components from Lung dis, CHF, Obesity, inactivity, Anemia, etc; a large component is likely related to anxiety & chest "tightness" for which she has Xanax 56m from her psychiatrists...  COPD>  Combined mild obstructive & mod restrictive lung dis; PFT showed sl deterioration in FEV1; multifactorial dyspnea including stress; REC> continue Advair, NEBS, O2, Mucinex, etc; get on diet/ exercise/ get weight down; continue [facility] f/u for help w/ anxiety...  HBP>  Controlled on low dose BBlocker & ACE, plus diuretic> continue same & work on weight reduction...  CAD, DiastolicCHF>  Known nonobstructive dis, way too sedentary> we discussed diet + exercise...  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  CHOL>  Stable on the Simva40...  DM>  Managed by DrEllison on Lantus insulin currently...  OBESITY>  Reviewed diet + exercise needed...  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  Autoimmune hepatitis>  Also managed by DrBrodie, off the Immuran at present... She also has pancytopenia related to her cirrhosis.  RLS>  Notes from DrDohmeier reviewed> on Sinemet + needs to restart Requip...  Dysthymia>  followed by [facility] on mult meds...  Anemia>  [facility] and DrDBrodie for GI...   Patient's Medications  New Prescriptions   LEVALBUTEROL (XOPENEX) 0.63 MG/3ML NEBULIZER SOLUTION    Take 3 mLs (0.63 mg total) by nebulization 3 (three) times daily.   PREDNISONE (DELTASONE) 10 MG TABLET    4 tabs for 2 days, then 3 tabs for 2 days, 2 tabs  for 2 days, then 1 tab for 2 days, then stop  Previous Medications   ALBUTEROL (PROAIR HFA) 108 (90 BASE) MCG/ACT INHALER    Inhale 2 puffs into the lungs every 6 (six) hours as needed for wheezing or shortness of breath.    ALPRAZOLAM (XANAX) 1 MG TABLET    Take 1 mg by mouth 3 (three) times daily as needed for anxiety.    AMBULATORY NON FORMULARY MEDICATION    Continuous O2 @@ 3LMP   B COMPLEX VITAMINS TABLET    Take 1 tablet by mouth daily.    B-D ULTRAFINE III SHORT PEN 31G X 8 MM  MISC    USE AS DIRECTED   BLOOD GLUCOSE MONITORING SUPPL (ONE TOUCH ULTRA MINI) W/DEVICE KIT    Test blood sugar up to three times daily   BUPROPION (WELLBUTRIN XL) 150 MG 24 HR TABLET    Take 150 mg by mouth daily.   DESVENLAFAXINE (PRISTIQ) 100 MG 24 HR TABLET    Take 100 mg by mouth daily. Takes with a 75m tablet, total daily dose is 1587m  DESVENLAFAXINE (PRISTIQ) 50 MG 24 HR TABLET    Take 50 mg by mouth daily. Takes with a 10025mablet, total daily dose is 150m23mDEXTROMETHORPHAN (DELSYM) 30 MG/5ML LIQUID    Take 60 mg by mouth at bedtime.    FLUTICASONE-SALMETEROL (ADVAIR DISKUS) 250-50 MCG/DOSE AEPB    INHALE 1 PUFF INTO THE LUNGS 2 (TWO) TIMES DAILY.   FUROSEMIDE (LASIX) 20 MG TABLET    Take 40 mg by mouth 2 (two) times daily.    GLUCOSE BLOOD (ONE TOUCH TEST STRIPS) TEST STRIP    Test blood sugar up to three times daily   INSULIN GLARGINE (LANTUS SOLOSTAR [place])    Inject 140 Units into the skin daily before supper.    INSULIN LISPRO (HUMALOG) 100 UNIT/ML KIWKPEN    Inject 20 Units into the skin daily with supper.   LACTULOSE (CHRONULAC) 10 GM/15ML SOLUTION    Take 30 cc po daily   LANCETS (ONETOUCH ULTRASOFT) LANCETS    Test blood sugar up to three times daily   LIDOCAINE (LIDODERM) 5 %    Place 1 patch onto the skin daily as needed (apply to right shoulder or right back rib area for pain). Remove & Discard patch within 12 hours or as directed by MD   LISINOPRIL (PRINIVIL,ZESTRIL) 5 MG TABLET    Take 5 mg  by mouth at bedtime.    MULTIPLE VITAMINS-MINERALS (CENTRUM SILVER PO)    Take by mouth daily.   NADOLOL (CORGARD) 20 MG TABLET    Take 0.5 tablets (10 mg total) by mouth daily.   PANTOPRAZOLE (PROTONIX) 40 MG TABLET    Take 1 tablet (40 mg total) by mouth 2 (two) times daily.   POLYETHYLENE GLYCOL (MIRALAX / GLYCOLAX) PACKET    Take 17 g by mouth daily as needed (constipation).   POTASSIUM CHLORIDE SA (K-DUR,KLOR-CON) 10 MEQ TABLET    Take 1 tablet (10 mEq total) by mouth every other day.   QUETIAPINE (SEROQUEL) 400 MG TABLET    Take 400 mg by mouth at bedtime.   ROPINIROLE (REQUIP) 4 MG TABLET    Take 4 mg by mouth at bedtime.   SALINE (OCEAN NASAL SPRAY NA)    Place 1 spray into the nose daily as needed (congestion).   SIMETHICONE (GAS-X PO)    Take 1 tablet by mouth daily as needed (flatulence).    SIMVASTATIN (ZOCOR) 40 MG TABLET    TAKE 1 TABLET BY MOUTH AT BEDTIME FOR CHOLESTEROL   VITAMIN D, ERGOCALCIFEROL, (DRISDOL) 50000 UNITS CAPS CAPSULE    Take 50,000 Units by mouth every 7 (seven) days. On fridays  Modified Medications   Modified Medication Previous Medication   PROMETHAZINE (PHENERGAN) 25 MG TABLET promethazine (PHENERGAN) 25 MG tablet      Take 1 tablet (25 mg total) by mouth 2 (two) times daily as needed for nausea.    Take 1 tablet (25 mg total) by mouth 2 (two) times daily as needed for nausea.  Discontinued Medications   No medications on file

## 2013-11-01 ENCOUNTER — Encounter (HOSPITAL_COMMUNITY)
Admission: RE | Admit: 2013-11-01 | Discharge: 2013-11-01 | Disposition: A | Discharge status: Home / Self Care | Payer: Medicare Other | Source: Ambulatory Visit | Attending: Pulmonary Disease | Admitting: Pulmonary Disease

## 2013-11-01 ENCOUNTER — Encounter: Payer: Self-pay | Admitting: *Deleted

## 2013-11-01 DIAGNOSIS — D509 Iron deficiency anemia, unspecified: Secondary | ICD-10-CM | POA: Diagnosis present

## 2013-11-01 LAB — POCT HEMOGLOBIN-HEMACUE: Hemoglobin: 9.9 g/dL — ABNORMAL LOW (ref 12.0–15.0)

## 2013-11-01 MED ORDER — DARBEPOETIN ALFA-POLYSORBATE 200 MCG/0.4ML IJ SOLN: 200.0000 ug | INTRAMUSCULAR | Status: DC

## 2013-11-01 MED ORDER — DARBEPOETIN ALFA-POLYSORBATE 200 MCG/0.4ML IJ SOLN
INTRAMUSCULAR | Status: AC
  Administered 2013-11-01: 200 ug via SUBCUTANEOUS
  Filled 2013-11-01: qty 0.4

## 2013-11-05 ENCOUNTER — Other Ambulatory Visit: Payer: Self-pay | Admitting: Pulmonary Disease

## 2013-11-05 DIAGNOSIS — E7800 Pure hypercholesterolemia, unspecified: Secondary | ICD-10-CM

## 2013-11-05 DIAGNOSIS — D500 Iron deficiency anemia secondary to blood loss (chronic): Secondary | ICD-10-CM

## 2013-11-05 DIAGNOSIS — E119 Type 2 diabetes mellitus without complications: Secondary | ICD-10-CM

## 2013-11-11 ENCOUNTER — Telehealth: Payer: Self-pay | Admitting: Pulmonary Disease

## 2013-11-12 NOTE — Telephone Encounter (Signed)
I spoke with Arbie Cookey and she states she figured this out, nothing further needed.Geyser Bing, CMA

## 2013-11-15 ENCOUNTER — Other Ambulatory Visit: Payer: Self-pay | Admitting: Physician Assistant

## 2013-11-15 ENCOUNTER — Encounter (HOSPITAL_COMMUNITY)
Admission: RE | Admit: 2013-11-15 | Discharge: 2013-11-15 | Disposition: A | Discharge status: Home / Self Care | Payer: Medicare Other | Source: Ambulatory Visit | Attending: Pulmonary Disease | Admitting: Pulmonary Disease

## 2013-11-15 DIAGNOSIS — D509 Iron deficiency anemia, unspecified: Secondary | ICD-10-CM | POA: Diagnosis not present

## 2013-11-15 LAB — POCT HEMOGLOBIN-HEMACUE: Hemoglobin: 8.5 g/dL — ABNORMAL LOW (ref 12.0–15.0)

## 2013-11-15 MED ORDER — DARBEPOETIN ALFA-POLYSORBATE 200 MCG/0.4ML IJ SOLN
200.0000 ug | INTRAMUSCULAR | Status: DC
  Administered 2013-11-15: 200 ug via SUBCUTANEOUS

## 2013-11-15 MED ORDER — DARBEPOETIN ALFA-POLYSORBATE 200 MCG/0.4ML IJ SOLN
INTRAMUSCULAR | Status: AC
  Filled 2013-11-15: qty 0.4

## 2013-11-18 ENCOUNTER — Encounter: Payer: Self-pay | Admitting: Family Medicine

## 2013-11-18 ENCOUNTER — Ambulatory Visit (INDEPENDENT_AMBULATORY_CARE_PROVIDER_SITE_OTHER): Payer: Medicare Other | Admitting: Family Medicine

## 2013-11-18 VITALS — BP 120/60 | HR 48 | Temp 97.7°F

## 2013-11-18 DIAGNOSIS — N183 Chronic kidney disease, stage 3 (moderate): Secondary | ICD-10-CM

## 2013-11-18 DIAGNOSIS — D61818 Other pancytopenia: Secondary | ICD-10-CM

## 2013-11-18 DIAGNOSIS — E7800 Pure hypercholesterolemia, unspecified: Secondary | ICD-10-CM

## 2013-11-18 DIAGNOSIS — K7460 Unspecified cirrhosis of liver: Secondary | ICD-10-CM

## 2013-11-18 DIAGNOSIS — I5032 Chronic diastolic (congestive) heart failure: Secondary | ICD-10-CM

## 2013-11-18 DIAGNOSIS — K5520 Angiodysplasia of colon without hemorrhage: Secondary | ICD-10-CM

## 2013-11-18 DIAGNOSIS — I509 Heart failure, unspecified: Secondary | ICD-10-CM

## 2013-11-18 DIAGNOSIS — G2581 Restless legs syndrome: Secondary | ICD-10-CM

## 2013-11-18 DIAGNOSIS — J4489 Other specified chronic obstructive pulmonary disease: Secondary | ICD-10-CM

## 2013-11-18 DIAGNOSIS — I10 Essential (primary) hypertension: Secondary | ICD-10-CM

## 2013-11-18 DIAGNOSIS — E1049 Type 1 diabetes mellitus with other diabetic neurological complication: Secondary | ICD-10-CM

## 2013-11-18 DIAGNOSIS — Z9181 History of falling: Secondary | ICD-10-CM

## 2013-11-18 DIAGNOSIS — F333 Major depressive disorder, recurrent, severe with psychotic symptoms: Secondary | ICD-10-CM

## 2013-11-18 DIAGNOSIS — E1122 Type 2 diabetes mellitus with diabetic chronic kidney disease: Secondary | ICD-10-CM | POA: Insufficient documentation

## 2013-11-18 DIAGNOSIS — J449 Chronic obstructive pulmonary disease, unspecified: Secondary | ICD-10-CM

## 2013-11-18 DIAGNOSIS — D500 Iron deficiency anemia secondary to blood loss (chronic): Secondary | ICD-10-CM

## 2013-11-18 DIAGNOSIS — R296 Repeated falls: Secondary | ICD-10-CM

## 2013-11-18 DIAGNOSIS — R0781 Pleurodynia: Secondary | ICD-10-CM

## 2013-11-18 DIAGNOSIS — K754 Autoimmune hepatitis: Secondary | ICD-10-CM

## 2013-11-18 DIAGNOSIS — N289 Disorder of kidney and ureter, unspecified: Secondary | ICD-10-CM

## 2013-11-18 DIAGNOSIS — K5901 Slow transit constipation: Secondary | ICD-10-CM

## 2013-11-18 DIAGNOSIS — R079 Chest pain, unspecified: Secondary | ICD-10-CM

## 2013-11-18 NOTE — Assessment & Plan Note (Signed)
Chronic Continue statin 

## 2013-11-18 NOTE — Assessment & Plan Note (Signed)
This is followed by endo Dr. Loanne Drilling Lab Results  Component Value Date   HGBA1C 6.5 08/30/2013

## 2013-11-18 NOTE — Progress Notes (Signed)
Pre visit review using our clinic review tool, if applicable. No additional management support is needed unless otherwise documented below in the visit note. 

## 2013-11-18 NOTE — Progress Notes (Signed)
BP 120/60  Pulse 48  Temp(Src) 97.7 F (36.5 C) (Oral)  SpO2 99%   CC: transfer care from Dr. Lenna Gilford  Subjective:    Patient ID: [name], female    DOB: 10/26/1940, 73 y.o.   MRN: [id number]  HPI: [name] is a 73 y.o. female presenting on 11/18/2013 for multiple issues   Complicated pt of Dr. Jeannine Kitten presents today to transfer care.  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 Presents with her sister who is wife to my patient Mr. Gerlean Ren.   Sees Dr. Olevia Perches GI and Dr. Burt Knack cards and Dr. Loanne Drilling endo.  Fall 3 wks ago out of bed while trying to get to phone.  This was at 8pm.  She hit her right side and it has been bothering her since then.  She also had a fall 06/2013 out of bed with 7th R rib fracture.  Treated with tylenol.  She has not undergone fall prevention program.  She has ordered bed rail.  She has geriatric nurse practitioner Mliss Sax from [facility] coming to house every 2 weeks.  Denies dysuria.  Chronic anemia due to hypertensive gastropathy from cirrhosis - on procrit injections Q2 weeks started 2015.  Last procrit injection was 11/15/2013.  Gets blood work checked every month. Lab Results  Component Value Date   HGB 8.5* 11/15/2013    MDD - on seroquel 431m nightly , pristiq 1533mdaily and wellbutrin 15039mr daily.  Trouble sleeping.  COPD - endorses compliance with advair as well as albuterol inhaler and xopenex neb prn.  On 3L O2 daily.  Chronic diastolic CHF - intermittent dyspnea.    DM - sugars high.  Complaint with lantus 140u at supper and humalog 20u with supper.  Followed by endo.  Renal insufficiency - has not been told has kidney troubles but reviewing recent blood work, Cr ranging  from 1.1 to 1.6 (GFR currently 47.)  Relevant past medical, surgical, family and social history reviewed and updated as indicated.  Allergies and medications reviewed and updated. Current Outpatient Prescriptions on File Prior to Visit  Medication Sig  . albuterol (PROAIR HFA) 108 (90 BASE) MCG/ACT inhaler Inhale 2 puffs into the lungs every 6 (six) hours as needed for wheezing or shortness of breath.   . ALPRAZolam (XANAX) 1 MG tablet Take 1 mg by mouth 3 (three) times daily as needed for anxiety.   . AMBULATORY NON FORMULARY MEDICATION Continuous O2 @@ 3LMP  . b complex vitamins tablet Take 1 tablet by mouth daily.   . B-D ULTRAFINE III SHORT PEN 31G X 8 MM MISC USE AS DIRECTED  . Blood Glucose Monitoring Suppl (ONE TOUCH ULTRA MINI) W/DEVICE KIT Test blood sugar up to three times daily  . buPROPion (WELLBUTRIN XL) 150 MG 24 hr tablet Take 150 mg by mouth daily.  . dMarland Kitchensvenlafaxine (PRISTIQ) 100 MG 24 hr tablet Take 100 mg by mouth daily. Takes with a 30m69mblet, total daily dose is 130mg30mdesvenlafaxine (PRISTIQ) 50 MG 24 hr tablet Take 50 mg by mouth daily. Takes with a 100mg 11met, total daily dose is 130mg  45mxtromethorphan (DELSYM) 30 MG/5ML liquid Take 60 mg by mouth at bedtime.   . Fluticasone-Salmeterol (ADVAIR DISKUS) 250-50 MCG/DOSE AEPB INHALE 1 PUFF  INTO THE LUNGS 2 (TWO) TIMES DAILY.  . furosemide (LASIX) 20 MG tablet Take 2 tablets (40 mg total) by mouth 2 (two) times daily.  [name] glucose blood (ONE TOUCH TEST STRIPS) test strip Test blood sugar up to three times daily  . Insulin Glargine (LANTUS SOLOSTAR [place]) Inject 140 Units into the skin daily before supper.   . insulin lispro (HUMALOG) 100 UNIT/ML KiwkPen Inject 20 Units into the skin daily with supper.  . lactulose (CHRONULAC) 10 GM/15ML solution Take 30 cc po daily  . Lancets (ONETOUCH ULTRASOFT) lancets Test blood sugar up to three times daily  . lidocaine (LIDODERM) 5 % Place 1 patch onto the skin daily as needed (apply  to right shoulder or right back rib area for pain). Remove & Discard patch within 12 hours or as directed by MD  . lisinopril (PRINIVIL,ZESTRIL) 5 MG tablet Take 5 mg by mouth at bedtime.   . Multiple Vitamins-Minerals (CENTRUM SILVER PO) Take by mouth daily.  . nadolol (CORGARD) 20 MG tablet Take 0.5 tablets (10 mg total) by mouth daily.  . pantoprazole (PROTONIX) 40 MG tablet Take 1 tablet (40 mg total) by mouth 2 (two) times daily.  . polyethylene glycol (MIRALAX / GLYCOLAX) packet Take 17 g by mouth daily as needed (constipation).  . potassium chloride SA (K-DUR,KLOR-CON) 10 MEQ tablet Take 1 tablet (10 mEq total) by mouth every other day.  . promethazine (PHENERGAN) 25 MG tablet Take 1 tablet (25 mg total) by mouth 2 (two) times daily as needed for nausea.  [name] QUEtiapine (SEROQUEL) 400 MG tablet Take 400 mg by mouth at bedtime.  [name] rOPINIRole (REQUIP) 4 MG tablet Take 4 mg by mouth at bedtime.  . Saline (OCEAN NASAL SPRAY NA) Place 1 spray into the nose daily as needed (congestion).  . Simethicone (GAS-X PO) Take 1 tablet by mouth daily as needed (flatulence).   . simvastatin (ZOCOR) 40 MG tablet TAKE 1 TABLET BY MOUTH AT BEDTIME FOR CHOLESTEROL  . Vitamin D, Ergocalciferol, (DRISDOL) 50000 UNITS CAPS capsule Take 50,000 Units by mouth every 7 (seven) days. On fridays   No current facility-administered medications on file prior to visit.    Review of Systems Per HPI unless specifically indicated above    Objective:    BP 120/60  Pulse 48  Temp(Src) 97.7 F (36.5 C) (Oral)  SpO2 99%  Physical Exam  Nursing note and vitals reviewed. Constitutional: She appears well-developed and well-nourished. No distress.  Chronically ill appearing  HENT:  Mouth/Throat: No oropharyngeal exudate.  Somewhat dry mm  Eyes: Conjunctivae and EOM are normal. Pupils are equal, round, and reactive to light.  Neck: Normal range of motion. Neck supple.  Cardiovascular: Normal rate, regular rhythm and  intact distal pulses.   Murmur (4/6 SEM) heard. Pulmonary/Chest: Effort normal and breath sounds normal. No respiratory distress. She has no wheezes. She has no rales.  Musculoskeletal: She exhibits no edema.  R posterior lower ribcage tenderness to palpation  Lymphadenopathy:    She has no cervical adenopathy.   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    Autoimmune hepatitis     See above.    Chronic diastolic CHF (congestive heart  failure)     continue lasix and low dose acei. No evidence of acute exac today.    Cirrhosis of liver without mention of alcohol     With complications/sequellae of liver disease - HTNive gastropathy, angiodysplasia.  Not transplant candidate. Will need to avoid hepatotoxins as able. Will assess Hep A/B immune status in future.    Constipation by delayed colonic transit     Stable on lactulose daily and miralax prn.    COPD - Primary (Chronic)     Encouraged continued compliance with advair bid and albuterol/xopenex prn.      Relevant Medications      levalbuterol (XOPENEX) 0.63 MG/3ML nebulizer solution   DEPRESSION, MAJOR, RECURRENT, SEVERE, W/PSYCHOTIC BEHAVIOR     Chronic, stable per patient. Continue pristiq and wellbutrin daily and seroquel nightly    Falls frequently     Offered HHPT home safety evaluation and fall prevention program.  Pt declines for now, states she will f/u with [facility] NP.    HYPERCHOLESTEROLEMIA     Chronic. Continue statin.    HYPERTENSION     Chronic, stable. Continue lisinopril, lasix and nadolol.    IRON DEFICIENCY ANEMIA SECONDARY TO BLOOD LOSS     Thought due to gastric and small bowel AVMs from hypertensive gastropathy due to NASH and autoimmune cirrhosis. On  procrit injections Q2 weeks prior managed by Dr. Lenna Gilford. H/o iron infusions and blood transfusions in the past. GI - Dr. Olevia Perches, seeing Q2 mo currently.    Other pancytopenia     H/o this anticipate due to cirrhosis.    Renal insufficiency     Pt endorses this is relatively new - discussed this and will need close monitoring.  Avoid NSAIDs. Overall ill patient but currently seems relatively stable.    RESTLESS LEGS SYNDROME     Stable. Continue requip.    Rib pain on right side     After fall - anticipate rib bruise or strain. Discussed this. Difficult situation given chronic multisystem disease - want to avoid analgesics in general 2/2 kidney and liver disease - but pt states has been told by GI small doses of tylenol are ok to take so I recommend starting with 543m tylenol scheduled nightly and use ice or heating pad to posterior right ribcage for pain.      Type I (juvenile type) diabetes mellitus with neurological manifestations, not stated as uncontrolled(250.61)      This is followed by endo Dr. ELoanne DrillingLab Results  Component Value Date   HGBA1C 6.5 08/30/2013          Follow up plan: Return in about 1 month (around 12/19/2013), or as needed, for follow up visit.

## 2013-11-18 NOTE — Assessment & Plan Note (Signed)
Encouraged continued compliance with advair bid and albuterol/xopenex prn.

## 2013-11-18 NOTE — Patient Instructions (Signed)
For back - you likely have rib bruise/strain. Treat with ice or heating pad to the area - whichever soothes better. May try tylenol 500mg  nightly. Return to see me in 1 month for follow up.

## 2013-11-18 NOTE — Assessment & Plan Note (Signed)
See above

## 2013-11-18 NOTE — Assessment & Plan Note (Signed)
Offered HHPT home safety evaluation and fall prevention program.  Pt declines for now, states she will f/u with [facility] NP.

## 2013-11-18 NOTE — Assessment & Plan Note (Signed)
Stable. Continue requip.

## 2013-11-18 NOTE — Assessment & Plan Note (Signed)
Chronic, stable per patient. Continue pristiq and wellbutrin daily and seroquel nightly

## 2013-11-18 NOTE — Assessment & Plan Note (Signed)
Pt endorses this is relatively new - discussed this and will need close monitoring.  Avoid NSAIDs. Overall ill patient but currently seems relatively stable.

## 2013-11-18 NOTE — Assessment & Plan Note (Signed)
Thought due to gastric and small bowel AVMs from hypertensive gastropathy due to NASH and autoimmune cirrhosis. On procrit injections Q2 weeks prior managed by Dr. Lenna Gilford. H/o iron infusions and blood transfusions in the past. GI - Dr. Olevia Perches, seeing Q2 mo currently.

## 2013-11-18 NOTE — Assessment & Plan Note (Signed)
Chronic, stable. Continue lisinopril, lasix and nadolol.

## 2013-11-18 NOTE — Assessment & Plan Note (Signed)
After fall - anticipate rib bruise or strain. Discussed this. Difficult situation given chronic multisystem disease - want to avoid analgesics in general 2/2 kidney and liver disease - but pt states has been told by GI small doses of tylenol are ok to take so I recommend starting with 500mg  tylenol scheduled nightly and use ice or heating pad to posterior right ribcage for pain.

## 2013-11-18 NOTE — Assessment & Plan Note (Signed)
H/o this anticipate due to cirrhosis.

## 2013-11-18 NOTE — Assessment & Plan Note (Signed)
Stable on lactulose daily and miralax prn.

## 2013-11-18 NOTE — Assessment & Plan Note (Addendum)
With complications/sequellae of liver disease - HTNive gastropathy, angiodysplasia.  Not transplant candidate. Will need to avoid hepatotoxins as able. Will assess Hep A/B immune status in future.

## 2013-11-18 NOTE — Assessment & Plan Note (Signed)
continue lasix and low dose acei. No evidence of acute exac today.

## 2013-11-19 ENCOUNTER — Telehealth: Payer: Self-pay | Admitting: Family Medicine

## 2013-11-19 NOTE — Telephone Encounter (Signed)
Relevant patient education mailed to patient.  

## 2013-11-19 NOTE — Telephone Encounter (Signed)
Relevant patient education assigned to patient using Emmi. ° °

## 2013-11-23 ENCOUNTER — Encounter: Payer: Self-pay | Admitting: Cardiovascular Disease

## 2013-11-23 ENCOUNTER — Ambulatory Visit (INDEPENDENT_AMBULATORY_CARE_PROVIDER_SITE_OTHER): Payer: Medicare Other | Admitting: Cardiovascular Disease

## 2013-11-23 VITALS — BP 122/64 | HR 60 | Ht 64.0 in | Wt 239.0 lb

## 2013-11-23 DIAGNOSIS — I5032 Chronic diastolic (congestive) heart failure: Secondary | ICD-10-CM

## 2013-11-23 DIAGNOSIS — R0609 Other forms of dyspnea: Secondary | ICD-10-CM

## 2013-11-23 DIAGNOSIS — R0989 Other specified symptoms and signs involving the circulatory and respiratory systems: Principal | ICD-10-CM

## 2013-11-23 MED ORDER — SPIRONOLACTONE 12.5 MG HALF TABLET: 12.5000 mg | ORAL_TABLET | Freq: Every day | ORAL | Status: DC

## 2013-11-23 MED ORDER — FUROSEMIDE 40 MG PO TABS: ORAL_TABLET | ORAL | Status: DC

## 2013-11-23 NOTE — Progress Notes (Signed)
HPI:  [name] is a 73 y.o. 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 LHC 11/2007: Dx 20%, proximal and mid RCA 20-30%, EF 50-55%. Echo 01/2013: EF 06-23%, grade 2 diastolic dysfunction, very mild aortic stenosis, mean gradient 9, mild AI, mild BAE, PASP 39. Admitted for a/c diastolic CHF 02/6282 and then seen in the ED in 04/2013 with volume excess.   The patient does not feel well. She complains of worsening shortness of breath. She also notes an increase in her weight and swelling in both her abdomen and legs. She denies orthopnea or PND. She's had no lightheadedness or syncope. She's had recurrent falls, most recently 3 weeks ago. She's had pain around the right lateral chest and in the left chest.   Outpatient Encounter Prescriptions as of 11/23/2013  Medication Sig  . albuterol (PROAIR HFA) 108 (90 BASE) MCG/ACT inhaler Inhale 2 puffs into the lungs every 6 (six) hours as needed for wheezing or shortness of breath.   . ALPRAZolam (XANAX) 1 MG tablet Take 1 mg by mouth 3 (three) times daily as needed for anxiety.   . AMBULATORY NON FORMULARY MEDICATION Continuous O2 @@ 3LMP  . b complex vitamins tablet Take 1 tablet by mouth daily.   . B-D ULTRAFINE III SHORT PEN 31G X 8 MM MISC USE AS DIRECTED  . Blood Glucose Monitoring Suppl (ONE TOUCH ULTRA MINI) W/DEVICE KIT Test blood sugar up to three times daily  . buPROPion (WELLBUTRIN XL) 150 MG 24 hr tablet Take 150 mg by mouth daily.  [name] desvenlafaxine (PRISTIQ) 100 MG 24 hr tablet Take 100 mg by mouth daily. Takes with a 62m tablet, total daily dose is 1519m . desvenlafaxine (PRISTIQ) 50 MG 24 hr tablet Take 50 mg by mouth daily. Takes with a 10057mablet, total daily dose is 150m83m dextromethorphan (DELSYM) 30 MG/5ML liquid Take 60 mg by mouth at bedtime.   .  Fluticasone-Salmeterol (ADVAIR DISKUS) 250-50 MCG/DOSE AEPB INHALE 1 PUFF INTO THE LUNGS 2 (TWO) TIMES DAILY.  . furosemide (LASIX) 20 MG tablet Take 2 tablets (40 mg total) by mouth 2 (two) times daily.  . glMarland Kitchencose blood (ONE TOUCH TEST STRIPS) test strip Test blood sugar up to three times daily  . Insulin Glargine (LANTUS SOLOSTAR [place]) Inject 140 Units into the skin daily before supper.   . insulin lispro (HUMALOG) 100 UNIT/ML KiwkPen Inject 20 Units into the skin daily with supper.  . lactulose (CHRONULAC) 10 GM/15ML solution Take 30 cc po daily  . Lancets (ONETOUCH ULTRASOFT) lancets Test blood sugar up to three times daily  . levalbuterol (XOPENEX) 0.63 MG/3ML nebulizer solution Take 0.63 mg by nebulization every 4 (four) hours as needed for wheezing or shortness of breath.  . lidocaine (LIDODERM) 5 % Place 1 patch onto the skin daily as needed (apply to right shoulder or right back rib area for pain). Remove & Discard patch within 12 hours or as directed by MD  . lisinopril (PRINIVIL,ZESTRIL) 5 MG tablet Take 5 mg by mouth at bedtime.   . Multiple Vitamins-Minerals (CENTRUM SILVER PO) Take by mouth daily.  . nadolol (CORGARD) 20 MG tablet Take 0.5 tablets (10 mg total) by mouth daily.  . pantoprazole (PROTONIX) 40 MG tablet Take 1 tablet (40 mg total) by mouth 2 (two) times daily.  . polyethylene glycol (  MIRALAX / GLYCOLAX) packet Take 17 g by mouth daily as needed (constipation).  . potassium chloride SA (K-DUR,KLOR-CON) 10 MEQ tablet Take 1 tablet (10 mEq total) by mouth every other day.  . promethazine (PHENERGAN) 25 MG tablet Take 1 tablet (25 mg total) by mouth 2 (two) times daily as needed for nausea.  [name] QUEtiapine (SEROQUEL) 400 MG tablet Take 400 mg by mouth at bedtime.  [name] rOPINIRole (REQUIP) 4 MG tablet Take 4 mg by mouth at bedtime.  . Saline (OCEAN NASAL SPRAY NA) Place 1 spray into the nose daily as needed (congestion).  . Simethicone (GAS-X PO) Take 1 tablet by mouth daily as  needed (flatulence).   . simvastatin (ZOCOR) 40 MG tablet TAKE 1 TABLET BY MOUTH AT BEDTIME FOR CHOLESTEROL  . Vitamin D, Ergocalciferol, (DRISDOL) 50000 UNITS CAPS capsule Take 50,000 Units by mouth every 7 (seven) days. On fridays    Allergies  Allergen Reactions  . Aspirin Other (See Comments)    Causes nosebleeds  . Penicillins Itching  . Theophylline Nausea And Vomiting    Past Medical History  Diagnosis Date  . Chronic airway obstruction, not elsewhere classified   . Unspecified chronic bronchitis   . Unspecified essential hypertension   . Coronary atherosclerosis of unspecified type of vessel, native or graft   . Palpitations   . Aortic valve disorders   . Pure hypercholesterolemia   . Morbid obesity   . Esophageal reflux   . Angiodysplasia of intestine (without mention of hemorrhage)   . Diverticulosis of colon (without mention of hemorrhage)   . Benign neoplasm of colon   . Autoimmune hepatitis   . Cystitis, unspecified   . Osteoarthrosis, unspecified whether generalized or localized, unspecified site   . Osteoporosis, unspecified   . Restless legs syndrome (RLS)   . Major depressive disorder, recurrent episode, severe, specified as with psychotic behavior   . Iron deficiency anemia secondary to blood loss (chronic)   . Coarse tremors     "just in my arms"  . Carpal tunnel syndrome on both sides   . Dysrhythmia     palpitations  . CHF (congestive heart failure)   . Asthma   . Shortness of breath     "any time really"  . Sleep apnea     "but I don't use the equipment"  . Type II diabetes mellitus   . Blood transfusion   . H/O hiatal hernia   . 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 Adenomatous colon polyp   . Anginal pain   . Anxiety   . Pneumonia   . Heart murmur   . AVM (arteriovenous malformation)   . Portal hypertensive gastropathy   . Falls frequently   . UTI (lower urinary tract infection)   . Hiatal hernia   . GAVE (gastric antral vascular  ectasia)     ROS: Negative except as per HPI  BP 122/64  Pulse 60  Ht _0  (1.626 m)  Wt 239 lb (108.41 kg)  BMI 41.00 kg/m2  PHYSICAL EXAM: Pt is alert and oriented, morbidly obese chronically ill-appearing woman in NAD HEENT: normal Neck: JVP - moderately elevated, carotids 2+= without bruits Lungs: CTA bilaterally CV: RRR without murmur or gallop Abd: soft, NT, Positive BS, no hepatomegaly Ext: 2+ pretibial edema, most prominent in the calf area, distal pulses intact and equal Skin: warm/dry no rash  EKG:  Sinus rhythm with occasional PVC nonspecific ST abnormality  ASSESSMENT AND PLAN: 1.  Acute on chronic diastolic heart failure. The patient appears volume overloaded on exam. I have made the following recommendations:  Increase furosemide to 80 mg in the morning, 40 mg in the afternoon  Aldactone 12.5 mg daily  Stop potassium supplementation  Continue low-sodium diet  Followup metabolic panel 2 weeks  Followup Scott Weaver 3-4 weeks  2. Hypertension. Blood pressure is controlled on current medical therapies  3. Aortic stenosis, mild by echocardiography. Continue clinical followup.  4. Anemia secondary to chronic GI blood loss. Followed carefully by primary care, gastroenterology, and hematology.  Sherren Mocha 11/23/2013 11:29 AM

## 2013-11-23 NOTE — Patient Instructions (Signed)
Your physician has recommended you make the following change in your medication:  Take Lasix 80 mg in the morning and 40 mg in the afternoon (approximately 4 pm) Take Aldactone 12.5 mg daily Stop [place] physician recommends that you return for lab work in: 2 weeks - Basic Metabolic panel  Your physician recommends that you schedule a follow-up appointment in: 3-4 weeks with Richardson Dopp, PA-C

## 2013-11-24 ENCOUNTER — Other Ambulatory Visit: Payer: Self-pay | Admitting: Pulmonary Disease

## 2013-11-25 ENCOUNTER — Encounter: Payer: Self-pay | Admitting: Cardiovascular Disease

## 2013-11-29 ENCOUNTER — Telehealth: Payer: Self-pay

## 2013-11-29 ENCOUNTER — Encounter (HOSPITAL_COMMUNITY)
Admission: RE | Admit: 2013-11-29 | Discharge: 2013-11-29 | Disposition: A | Discharge status: Home / Self Care | Payer: Medicare Other | Source: Ambulatory Visit | Attending: Family Medicine | Admitting: Family Medicine

## 2013-11-29 DIAGNOSIS — D509 Iron deficiency anemia, unspecified: Secondary | ICD-10-CM | POA: Insufficient documentation

## 2013-11-29 LAB — IRON AND TIBC
Iron: 145 ug/dL — ABNORMAL HIGH (ref 42–135)
Saturation Ratios: 40 % (ref 20–55)
TIBC: 365 ug/dL (ref 250–470)
UIBC: 220 ug/dL (ref 125–400)

## 2013-11-29 LAB — FERRITIN: FERRITIN: 67 ng/mL (ref 10–291)

## 2013-11-29 MED ORDER — DARBEPOETIN ALFA-POLYSORBATE 200 MCG/0.4ML IJ SOLN
INTRAMUSCULAR | Status: AC
  Administered 2013-11-29: 200 ug via SUBCUTANEOUS
  Filled 2013-11-29: qty 0.4

## 2013-11-29 MED ORDER — DARBEPOETIN ALFA-POLYSORBATE 200 MCG/0.4ML IJ SOLN: 200.0000 ug | INTRAMUSCULAR | Status: DC

## 2013-11-29 NOTE — Telephone Encounter (Signed)
Pt is at [facility] Short stay getting Aranesp injection to boost hgb.; today pts hgb 7.6. No cardiac symptoms; pt usually is SOB and has blood in stools. Today new symptom is pt is cold. Please advise.

## 2013-11-29 NOTE — Progress Notes (Signed)
Called Dr. Jeannine Kitten office regarding hgb 7.6. Was told pt has been transferred to Dr. Danise Mina for primary care. Called Dr. Bosie Clos office and reported hgb. Orders received. Patient aware. Verbalizes understanding. Registration changed MD name on patient account accordingly. Short Stay order form faxed to office for Dr. Phillis Knack to fill out.

## 2013-11-29 NOTE — Telephone Encounter (Signed)
Cathy advised per Dr Gutierrez's instruction to give pt Aranesp injection and add Iron panel and ferritin. Dr Darnell Level will contact pt with those results. Cathy at short stay said present orders from Dr Lenna Gilford are pt to have hgb prior to Aranesp visit. Pt receives Aranesp injections q 2 weeks until hgb > 11. Then decrease Aranesp injections to monthly; performing a hgb prior to injection; if hgb < 12 give Aranesp injection, if hgb > 12 hold Aranesp injection until next scheduled visit. Tye [place] said needed new order by Dr Darnell Level instead of Dr Lenna Gilford. [place] faxed over form for Dr Synthia Innocent completion and signature and asked form faxed back to [phone].(form is in Dr Synthia Innocent in box.)

## 2013-11-30 LAB — POCT HEMOGLOBIN-HEMACUE: HEMOGLOBIN: 7.6 g/dL — AB (ref 12.0–15.0)

## 2013-12-01 ENCOUNTER — Ambulatory Visit (INDEPENDENT_AMBULATORY_CARE_PROVIDER_SITE_OTHER)
Admission: RE | Admit: 2013-12-01 | Discharge: 2013-12-01 | Disposition: A | Discharge status: Home / Self Care | Payer: Medicare Other | Source: Ambulatory Visit | Attending: Family Medicine | Admitting: Family Medicine

## 2013-12-01 ENCOUNTER — Ambulatory Visit (INDEPENDENT_AMBULATORY_CARE_PROVIDER_SITE_OTHER): Payer: Medicare Other | Admitting: Family Medicine

## 2013-12-01 ENCOUNTER — Other Ambulatory Visit: Payer: Medicare Other

## 2013-12-01 ENCOUNTER — Telehealth: Payer: Self-pay | Admitting: Radiology

## 2013-12-01 ENCOUNTER — Encounter: Payer: Self-pay | Admitting: Family Medicine

## 2013-12-01 ENCOUNTER — Other Ambulatory Visit: Payer: Self-pay | Admitting: Family Medicine

## 2013-12-01 VITALS — BP 141/65 | HR 58 | Temp 97.9°F | Ht 64.0 in | Wt 237.0 lb

## 2013-12-01 DIAGNOSIS — E1049 Type 1 diabetes mellitus with other diabetic neurological complication: Secondary | ICD-10-CM

## 2013-12-01 DIAGNOSIS — S2239XA Fracture of one rib, unspecified side, initial encounter for closed fracture: Secondary | ICD-10-CM

## 2013-12-01 DIAGNOSIS — R0781 Pleurodynia: Secondary | ICD-10-CM

## 2013-12-01 DIAGNOSIS — R079 Chest pain, unspecified: Secondary | ICD-10-CM

## 2013-12-01 DIAGNOSIS — D500 Iron deficiency anemia secondary to blood loss (chronic): Secondary | ICD-10-CM

## 2013-12-01 DIAGNOSIS — J4489 Other specified chronic obstructive pulmonary disease: Secondary | ICD-10-CM

## 2013-12-01 DIAGNOSIS — K7460 Unspecified cirrhosis of liver: Secondary | ICD-10-CM

## 2013-12-01 DIAGNOSIS — J9610 Chronic respiratory failure, unspecified whether with hypoxia or hypercapnia: Secondary | ICD-10-CM

## 2013-12-01 DIAGNOSIS — K754 Autoimmune hepatitis: Secondary | ICD-10-CM

## 2013-12-01 DIAGNOSIS — R5381 Other malaise: Secondary | ICD-10-CM

## 2013-12-01 DIAGNOSIS — J449 Chronic obstructive pulmonary disease, unspecified: Secondary | ICD-10-CM

## 2013-12-01 DIAGNOSIS — S2231XA Fracture of one rib, right side, initial encounter for closed fracture: Secondary | ICD-10-CM

## 2013-12-01 DIAGNOSIS — I2510 Atherosclerotic heart disease of native coronary artery without angina pectoris: Secondary | ICD-10-CM

## 2013-12-01 LAB — CBC WITH DIFFERENTIAL/PLATELET
BASOS ABS: 0 10*3/uL (ref 0.0–0.1)
Basophils Relative: 0.6 % (ref 0.0–3.0)
Eosinophils Absolute: 0.1 10*3/uL (ref 0.0–0.7)
Eosinophils Relative: 4.6 % (ref 0.0–5.0)
HCT: 23.4 % — CL (ref 36.0–46.0)
HEMOGLOBIN: 7.9 g/dL — AB (ref 12.0–15.0)
Lymphocytes Relative: 11.6 % — ABNORMAL LOW (ref 12.0–46.0)
Lymphs Abs: 0.4 10*3/uL — ABNORMAL LOW (ref 0.7–4.0)
MCHC: 33.7 g/dL (ref 30.0–36.0)
MCV: 101.8 fl — ABNORMAL HIGH (ref 78.0–100.0)
MONOS PCT: 10.6 % (ref 3.0–12.0)
Monocytes Absolute: 0.3 10*3/uL (ref 0.1–1.0)
NEUTROS ABS: 2.3 10*3/uL (ref 1.4–7.7)
NEUTROS PCT: 72.6 % (ref 43.0–77.0)
Platelets: 99 10*3/uL — ABNORMAL LOW (ref 150.0–400.0)
RBC: 2.3 Mil/uL — AB (ref 3.87–5.11)
RDW: 17.3 % — ABNORMAL HIGH (ref 11.5–14.6)
WBC: 3.2 10*3/uL — ABNORMAL LOW (ref 4.5–10.5)

## 2013-12-01 NOTE — Telephone Encounter (Signed)
filled and placed in my out box. plz see result notes for anemia plan.

## 2013-12-01 NOTE — Telephone Encounter (Signed)
[facility] called a critical HGB- 7.9, HCT-23.4, results given to Dr Lorelei Pont

## 2013-12-01 NOTE — Progress Notes (Signed)
Pre visit review using our clinic review tool, if applicable. No additional management support is needed unless otherwise documented below in the visit note. 

## 2013-12-01 NOTE — Progress Notes (Signed)
Date:  12/01/2013   Name:  [name]   DOB:  09-16-1940   MRN:  [id number]  Primary Physician:  Ria Bush, MD   Chief Complaint: Rib Injury and Fall   Subjective:   History of Present Illness:  [name] is a 73 y.o. 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  The patient fell approximately 3 weeks ago, and since then she is had some significant pain on the RIGHT side of her chest. She does have a nurse at [facility] evaluates her, and the nurse felt like she should seek medical attention. She has had some significant pain. She actually has some oxycodone at home, and she tried to take his last night with some relief.  Patient Active Problem List   Diagnosis Date Noted  . Chronic respiratory failure 12/02/2013  . Renal insufficiency 11/18/2013  . Rib pain on right side 11/18/2013  . Cirrhosis of liver without mention of alcohol 11/18/2013  . Dyspnea 05/31/2013  . Physical deconditioning 05/25/2013  . Chronic diastolic CHF (congestive heart failure) 03/11/2013  . Falls frequently   . Constipation by delayed colonic transit 02/03/2013  . Type I (juvenile type) diabetes mellitus with neurological manifestations, not stated as uncontrolled 01/22/2013  . Acute on chronic diastolic heart failure 10/93/2355  . Other pancytopenia 01/19/2013  . 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 Obesity 10/25/2011  . VENTRICULAR TACHYCARDIA 09/06/2009  . HYPERCHOLESTEROLEMIA 02/01/2009  . PALPITATIONS 09/16/2008  . RESTLESS LEGS SYNDROME 02/23/2008  . CYSTITIS, RECURRENT 02/23/2008  . CAD 02/12/2008  . DEPRESSION, MAJOR,  RECURRENT, SEVERE, W/PSYCHOTIC BEHAVIOR 11/09/2007  . IRON DEFICIENCY ANEMIA SECONDARY TO BLOOD LOSS 08/28/2007  . AORTIC STENOSIS 08/28/2007  . BRONCHITIS, RECURRENT 08/28/2007  . Autoimmune hepatitis 08/28/2007  . OSTEOARTHRITIS 08/28/2007  . OSTEOPOROSIS 08/28/2007  . HYPERTENSION 06/25/2007  . COPD 06/25/2007  . GERD 06/25/2007  . ANGIODYSPLASIA, INTESTINE, WITHOUT HEMORRHAGE 06/25/2007  . FIBROMYALGIA 06/25/2007    Past Medical History  Diagnosis Date  . Chronic airway obstruction, not elsewhere classified   . Unspecified chronic bronchitis   . Unspecified essential hypertension   . Coronary atherosclerosis of unspecified type of vessel, native or graft   . Palpitations   . Aortic valve disorders   . Pure hypercholesterolemia   . Morbid obesity   . Esophageal reflux   . Angiodysplasia of intestine (without mention of hemorrhage)   . Diverticulosis of colon (without mention of hemorrhage)   . Benign neoplasm of colon   . Autoimmune hepatitis   . Cystitis, unspecified   . Osteoarthrosis, unspecified whether generalized or localized, unspecified site   . Osteoporosis, unspecified   . Restless legs syndrome (RLS)   . Major depressive disorder, recurrent episode, severe, specified as with psychotic behavior   . Iron deficiency anemia secondary to blood loss (chronic)   . Coarse tremors     "just in my arms"  . Carpal tunnel syndrome on both sides   . Dysrhythmia     palpitations  . CHF (congestive heart failure)   . Asthma   . Shortness of breath     "any time really"  . Sleep apnea     "but I don't  use the equipment"  . Type II diabetes mellitus   . Blood transfusion   . H/O hiatal hernia   . 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 Adenomatous colon polyp   . Anginal pain   . Anxiety   . Pneumonia   . Heart murmur   . AVM (arteriovenous malformation)   . Portal hypertensive gastropathy   . Falls frequently   . UTI (lower urinary tract infection)   . Hiatal  hernia   . GAVE (gastric antral vascular ectasia)     Past Surgical History  Procedure Laterality Date  . Hemorroidectomy    . Appendectomy    . Vaginal hysterectomy    . Breast biopsy      bilateral  . Cesarean section      x 3  . Appendectomy    . Esophagogastroduodenoscopy  06/12/2012    Procedure: ESOPHAGOGASTRODUODENOSCOPY (EGD);  Surgeon: Lafayette Dragon, MD;  Location: [facility];  Service: Endoscopy;  Laterality: N/A;  . Balloon dilation  06/12/2012    Procedure: BALLOON DILATION;  Surgeon: Lafayette Dragon, MD;  Location: [facility];  Service: Endoscopy;  Laterality: N/A;  ?balloon  . Esophagogastroduodenoscopy  09/10/2012    Procedure: ESOPHAGOGASTRODUODENOSCOPY (EGD);  Surgeon: Lafayette Dragon, MD;  Location: [facility];  Service: Endoscopy;  Laterality: N/A;  . Hot hemostasis  09/10/2012    Procedure: HOT HEMOSTASIS (ARGON PLASMA COAGULATION/BICAP);  Surgeon: Lafayette Dragon, MD;  Location: [facility];  Service: Endoscopy;  Laterality: N/A;  . Esophagogastroduodenoscopy N/A 01/18/2013    Procedure: ESOPHAGOGASTRODUODENOSCOPY (EGD);  Surgeon: Lafayette Dragon, MD;  Location: [facility]COPY;  Service: Endoscopy;  Laterality: N/A;  . Esophagogastroduodenoscopy N/A 05/24/2013    Procedure: ESOPHAGOGASTRODUODENOSCOPY (EGD);  Surgeon: Jerene Bears, MD;  Location: [facility];  Service: Gastroenterology;  Laterality: N/A;  . Hot hemostasis N/A 05/24/2013    Procedure: HOT HEMOSTASIS (ARGON PLASMA COAGULATION/BICAP);  Surgeon: Jerene Bears, MD;  Location: [facility];  Service: Gastroenterology;  Laterality: N/A;    History   Social History  . Marital Status: Married    Spouse Name: jerry Teegarden    Number of Children: 4  . Years of Education: 12   Occupational History  . Disabled   .     Social History Main Topics  . Smoking status: Former Smoker -- 1.50 packs/day for 25 years    Types: Cigarettes    Quit date: 08/26/1992  . Smokeless tobacco: Never Used  . Alcohol Use: No      Comment: 12/18/93 "last alcoholic drink was years ago"  . Drug Use: No  . Sexual Activity: No   Other Topics Concern  . Not on file   Social History Narrative   Patient lives at home with her husband [name]).    Retired.   Caffeine- None    Right handed.      Dr. Olevia Perches GI    Dr. Burt Knack cards    Dr. Loanne Drilling endo    Family History  Problem Relation Age of Onset  . Heart disease Mother   . Cervical cancer Mother   . Kidney disease Mother   . Diabetes Mother   . Breast cancer Sister   . Multiple sclerosis Sister   . Colon cancer Neg Hx   . Breast cancer Maternal Aunt     x 2  . Diabetes Father   . Diabetes Sister   . Multiple sclerosis Other     Allergies  Allergen Reactions  .  Aspirin Other (See Comments)    Causes nosebleeds  . Penicillins Itching  . Theophylline Nausea And Vomiting    Medication list has been reviewed and updated.  Review of Systems:   GEN: No acute illnesses, no fevers, chills. GI: No n/v/d, eating normally Pulm: No SOB Interactive and getting along well at home.  Otherwise, ROS is as per the HPI.  Objective:   Physical Examination: BP 141/65  Pulse 58  Temp(Src) 97.9 F (36.6 C) (Oral)  Ht 5' 4"  (1.626 m)  Wt 237 lb (107.502 kg)  BMI 40.66 kg/m2  SpO2 98%  Ideal Body Weight: Weight in (lb) to have BMI = 25: 145.3   GEN: WDWN, NAD, Non-toxic, A & O x 3 HEENT: Atraumatic, Normocephalic. Neck supple. No masses, No LAD. Ears and Nose: No external deformity. Chest wall: there is pain on the RIGHT side with compression of the sternum. Left-sided palpation of the ribs reveals nontender ribs and anatomy. The RIGHT side in the anterolateral aspect of the patient's ribs is markedly tender. NEURO wheelchair PSYCH: Normally interactive. Conversant. Not depressed or anxious appearing.  Calm demeanor.   Laboratory and Imaging Data: Dg Ribs Unilateral Right  12/01/2013   CLINICAL DATA:  Fall with rib pain.  EXAM: RIGHT RIBS - 2 VIEW   COMPARISON:  10/03/2013  FINDINGS: There is a fracture of the lateral right seventh rib, minimally displaced. No other fractures.  Mild opacity is noted at the right lung base, likely atelectasis. A small focal area of linear atelectasis extends laterally from the left hilum.  Lungs are otherwise clear.  No pleural effusion.  No pneumothorax.  IMPRESSION: Nondisplaced right seventh rib fracture.  No fracture complication.   Electronically Signed   By: Lajean Manes M.D.   On: 12/01/2013 13:53    Results for orders placed in visit on 12/01/13  CBC WITH DIFFERENTIAL      Result Value Ref Range   WBC 3.2 (*) 4.5 - 10.5 K/uL   RBC 2.30 (*) 3.87 - 5.11 Mil/uL   Hemoglobin 7.9 (*) 12.0 - 15.0 g/dL   HCT 23.4 (*) 36.0 - 46.0 %   MCV 101.8 (*) 78.0 - 100.0 fl   MCHC 33.7  30.0 - 36.0 g/dL   RDW 17.3 (*) 11.5 - 14.6 %   Platelets 99.0 (*) 150.0 - 400.0 K/uL   Neutrophils Relative % 72.6  43.0 - 77.0 %   Lymphocytes Relative 11.6 (*) 12.0 - 46.0 %   Monocytes Relative 10.6  3.0 - 12.0 %   Eosinophils Relative 4.6  0.0 - 5.0 %   Basophils Relative 0.6  0.0 - 3.0 %   Neutro Abs 2.3  1.4 - 7.7 K/uL   Lymphs Abs 0.4 (*) 0.7 - 4.0 K/uL   Monocytes Absolute 0.3  0.1 - 1.0 K/uL   Eosinophils Absolute 0.1  0.0 - 0.7 K/uL   Basophils Absolute 0.0  0.0 - 0.1 K/uL     Assessment & Plan:   Fracture of rib of right side  IRON DEFICIENCY ANEMIA SECONDARY TO BLOOD LOSS - Plan: CBC with Differential  Rib pain on right side - Plan: DG Ribs Unilateral Right  COPD  Physical deconditioning  CAD  Autoimmune hepatitis  Type I (juvenile type) diabetes mellitus with neurological manifestations, not stated as uncontrolled(250.61)  Cirrhosis of liver without mention of alcohol  Chronic respiratory failure  The patient has an acute right-sided rib fracture. Given her overall state of health and poor potential for healing,  I wouldn't expect her to take longer than a typical person to heal despite fracture.  Overall, I would expect adequate healing to occur from 6-8 weeks from the initial accident.  Date of injury is approximately November 12, 2013.  The patient also has chronic anemia, iron deficiency. She has been on Procrit from her prior physician. I spoke to my partner about this patient, informed her of her recent markedly abnormal blood count. Dr. Darnell Level may consider transfusion in this patient. In this matter, will completely defer to his judgement.  A UNIVERSAL FRACTURE CHARGE HAS BEEN APPLIED IN THE CARE OF THIS INJURY.  No co-pay should be applied in the future, and there is a 90 day follow-up period for subsequent care of this injury.   Follow-up: prn. None is typically needed.  New Prescriptions   No medications on file   Orders Placed This Encounter  Procedures  . DG Ribs Unilateral Right  . CBC with Differential   There are no Patient Instructions on file for this visit.  Signed,  Maud Deed. Tiawanna Luchsinger, MD, [place] at [facility] [place] [address] Phone: [phone] Fax: [phone]  Patient's Medications  New Prescriptions   No medications on file  Previous Medications   ALBUTEROL (PROAIR HFA) 108 (90 BASE) MCG/ACT INHALER    Inhale 2 puffs into the lungs every 6 (six) hours as needed for wheezing or shortness of breath.    ALPRAZOLAM (XANAX) 1 MG TABLET    Take 1 mg by mouth 3 (three) times daily as needed for anxiety.    AMBULATORY NON FORMULARY MEDICATION    Continuous O2 @@ 3LMP   B COMPLEX VITAMINS TABLET    Take 1 tablet by mouth daily.    B-D ULTRAFINE III SHORT PEN 31G X 8 MM MISC    USE AS DIRECTED   BLOOD GLUCOSE MONITORING SUPPL (ONE TOUCH ULTRA MINI) W/DEVICE KIT    Test blood sugar up to three times daily   BUPROPION (WELLBUTRIN XL) 150 MG 24 HR TABLET    Take 150 mg by mouth daily.   DESVENLAFAXINE (PRISTIQ) 100 MG 24 HR TABLET    Take 100 mg by mouth daily. Takes with a 47m tablet, total daily dose is 1566m   DESVENLAFAXINE (PRISTIQ) 50 MG 24 HR TABLET    Take 50 mg by mouth daily. Takes with a 10061mablet, total daily dose is 150m62mDEXTROMETHORPHAN (DELSYM) 30 MG/5ML LIQUID    Take 60 mg by mouth at bedtime.    FLUTICASONE-SALMETEROL (ADVAIR DISKUS) 250-50 MCG/DOSE AEPB    INHALE 1 PUFF INTO THE LUNGS 2 (TWO) TIMES DAILY.   GLUCOSE BLOOD (ONE TOUCH TEST STRIPS) TEST STRIP    Test blood sugar up to three times daily   INSULIN GLARGINE (LANTUS SOLOSTAR [place])    Inject 140 Units into the skin daily before supper.    INSULIN LISPRO (HUMALOG) 100 UNIT/ML KIWKPEN    Inject 20 Units into the skin daily with supper.   LACTULOSE (CHRONULAC) 10 GM/15ML SOLUTION    Take 30 cc po daily   LANCETS (ONETOUCH ULTRASOFT) LANCETS    Test blood sugar up to three times daily   LEVALBUTEROL (XOPENEX) 0.63 MG/3ML NEBULIZER SOLUTION    Take 0.63 mg by nebulization every 4 (four) hours as needed for wheezing or shortness of breath.   LIDOCAINE (LIDODERM) 5 %    Place 1 patch onto the skin daily as needed (  apply to right shoulder or right back rib area for pain). Remove & Discard patch within 12 hours or as directed by MD   LISINOPRIL (PRINIVIL,ZESTRIL) 5 MG TABLET    Take 5 mg by mouth at bedtime.    MULTIPLE VITAMINS-MINERALS (CENTRUM SILVER PO)    Take by mouth daily.   NADOLOL (CORGARD) 20 MG TABLET    Take 0.5 tablets (10 mg total) by mouth daily.   PANTOPRAZOLE (PROTONIX) 40 MG TABLET    Take 1 tablet (40 mg total) by mouth 2 (two) times daily.   POLYETHYLENE GLYCOL (MIRALAX / GLYCOLAX) PACKET    Take 17 g by mouth daily as needed (constipation).   PROMETHAZINE (PHENERGAN) 25 MG TABLET    Take 1 tablet (25 mg total) by mouth 2 (two) times daily as needed for nausea.   QUETIAPINE (SEROQUEL) 400 MG TABLET    Take 400 mg by mouth at bedtime.   ROPINIROLE (REQUIP) 4 MG TABLET    Take 4 mg by mouth at bedtime.   SALINE (OCEAN NASAL SPRAY NA)    Place 1 spray into the nose daily as needed (congestion).   SIMETHICONE  (GAS-X PO)    Take 1 tablet by mouth daily as needed (flatulence).    SIMVASTATIN (ZOCOR) 40 MG TABLET    TAKE 1 TABLET BY MOUTH AT BEDTIME FOR CHOLESTEROL   SPIRONOLACTONE (ALDACTONE) 12.5 MG TABS TABLET    Take 0.5 tablets (12.5 mg total) by mouth daily.   VITAMIN D, ERGOCALCIFEROL, (DRISDOL) 50000 UNITS CAPS CAPSULE    Take 50,000 Units by mouth every 7 (seven) days. On fridays  Modified Medications   Modified Medication Previous Medication   FUROSEMIDE (LASIX) 40 MG TABLET furosemide (LASIX) 40 MG tablet      Take 40 mg by mouth 2 (two) times daily. Take 2 tablets (80 mg) in the morning and 1 tablet in the afternoon    Take 2 tablets (80 mg) in the morning and 1 tablet in the afternoon  Discontinued Medications   No medications on file

## 2013-12-01 NOTE — Telephone Encounter (Signed)
Noted - not new or surprising. Will send to Dr. Darnell Level

## 2013-12-02 ENCOUNTER — Telehealth: Payer: Self-pay

## 2013-12-02 DIAGNOSIS — J9610 Chronic respiratory failure, unspecified whether with hypoxia or hypercapnia: Secondary | ICD-10-CM | POA: Insufficient documentation

## 2013-12-02 NOTE — Telephone Encounter (Signed)
Known pancytopenia.  Hgb now 7.9.  plz call patient - to get update on her sxs - if having worsening dyspnea or chest pain or dizziness may suggest transfusion.  Otherwise would wait for eval by Dr. Olevia Perches on Monday.

## 2013-12-02 NOTE — Telephone Encounter (Signed)
Pt left v/m requesting lab results.

## 2013-12-02 NOTE — Telephone Encounter (Signed)
See my earlier message. plz notify patient and ask about sxs.

## 2013-12-02 NOTE — Telephone Encounter (Signed)
Left voicemail requesting pt to call office back 

## 2013-12-03 ENCOUNTER — Encounter (HOSPITAL_COMMUNITY)
Admission: RE | Admit: 2013-12-03 | Discharge: 2013-12-03 | Disposition: A | Discharge status: Home / Self Care | Payer: Medicare Other | Source: Ambulatory Visit | Attending: Family Medicine | Admitting: Family Medicine

## 2013-12-03 ENCOUNTER — Other Ambulatory Visit (HOSPITAL_COMMUNITY): Payer: Self-pay | Admitting: *Deleted

## 2013-12-03 LAB — PREPARE RBC (CROSSMATCH)

## 2013-12-03 NOTE — Telephone Encounter (Signed)
plz schedule for blood transfusion today if able - 1/2 unit pRBC then lasix 20mg  then 1/2 unit.

## 2013-12-03 NOTE — Telephone Encounter (Signed)
[name] (pt care cor.) advise pt needs transfusion, she has order and is scheduling pt's transfusion, pt aware

## 2013-12-03 NOTE — Telephone Encounter (Signed)
Pt notified of lab results and Dr. Synthia Innocent comments. Pt said that she is having worsening SOB, and dizziness, and it's getting worse to the point that she has fell a few times at home. Pt said she isn't having chest pain but just soreness around her ribs, please advise

## 2013-12-03 NOTE — Telephone Encounter (Signed)
Addressed through 1st phone note

## 2013-12-05 ENCOUNTER — Other Ambulatory Visit: Payer: Self-pay | Admitting: Cardiovascular Disease

## 2013-12-06 ENCOUNTER — Other Ambulatory Visit: Payer: Self-pay | Admitting: Internal Medicine

## 2013-12-06 ENCOUNTER — Telehealth: Payer: Self-pay | Admitting: Endocrinology

## 2013-12-06 ENCOUNTER — Encounter (HOSPITAL_COMMUNITY)
Admission: RE | Admit: 2013-12-06 | Discharge: 2013-12-06 | Disposition: A | Discharge status: Home / Self Care | Payer: Medicare Other | Source: Ambulatory Visit | Attending: Family Medicine | Admitting: Family Medicine

## 2013-12-06 NOTE — Telephone Encounter (Signed)
plz clarify with patient - did Dr. Olevia Perches want rpt hgb prior to appt at end of week? Ok to check Hgb tomorrow if pt desires.

## 2013-12-06 NOTE — Telephone Encounter (Addendum)
Pt had v/m on machine today at 1:36 telling pt Dr Darnell Level wanted pt to have hgb done today; pt already had blood transfusion at [facility] today per Dr Darnell Level.Please advise. Have spoken with Maudie Mercury CMA and Terri in lab and no one contacted pt.

## 2013-12-06 NOTE — Telephone Encounter (Signed)
Pt just got out of the short stay

## 2013-12-06 NOTE — Telephone Encounter (Signed)
Spoke with patient. She said she wasn't sure, but would come in tomorrow for labs. Appt scheduled.

## 2013-12-07 ENCOUNTER — Other Ambulatory Visit: Payer: Medicare Other

## 2013-12-07 ENCOUNTER — Ambulatory Visit: Payer: Medicare Other | Admitting: Endocrinology

## 2013-12-07 ENCOUNTER — Telehealth: Payer: Self-pay | Admitting: *Deleted

## 2013-12-07 ENCOUNTER — Other Ambulatory Visit: Payer: Self-pay | Admitting: Family Medicine

## 2013-12-07 ENCOUNTER — Other Ambulatory Visit (INDEPENDENT_AMBULATORY_CARE_PROVIDER_SITE_OTHER): Payer: Medicare Other

## 2013-12-07 ENCOUNTER — Telehealth: Payer: Self-pay | Admitting: Pulmonary Disease

## 2013-12-07 DIAGNOSIS — K7460 Unspecified cirrhosis of liver: Secondary | ICD-10-CM

## 2013-12-07 DIAGNOSIS — K754 Autoimmune hepatitis: Secondary | ICD-10-CM

## 2013-12-07 DIAGNOSIS — D500 Iron deficiency anemia secondary to blood loss (chronic): Secondary | ICD-10-CM

## 2013-12-07 LAB — COMPREHENSIVE METABOLIC PANEL
ALK PHOS: 100 U/L (ref 39–117)
ALT: 33 U/L (ref 0–35)
AST: 41 U/L — AB (ref 0–37)
Albumin: 3 g/dL — ABNORMAL LOW (ref 3.5–5.2)
BILIRUBIN TOTAL: 1.5 mg/dL — AB (ref 0.3–1.2)
BUN: 33 mg/dL — ABNORMAL HIGH (ref 6–23)
CALCIUM: 8.7 mg/dL (ref 8.4–10.5)
CO2: 29 mEq/L (ref 19–32)
Chloride: 101 mEq/L (ref 96–112)
Creatinine, Ser: 1.3 mg/dL — ABNORMAL HIGH (ref 0.4–1.2)
GFR: 41.87 mL/min — AB (ref >60.00)
Glucose, Bld: 198 mg/dL — ABNORMAL HIGH (ref 70–99)
Potassium: 4.3 mEq/L (ref 3.5–5.1)
SODIUM: 137 meq/L (ref 135–145)
TOTAL PROTEIN: 5.8 g/dL — AB (ref 6.0–8.3)

## 2013-12-07 LAB — TYPE AND SCREEN
ABO/RH(D): O POS
Antibody Screen: POSITIVE
DAT, IgG: NEGATIVE
Donor AG Type: NEGATIVE
Unit division: 0

## 2013-12-07 LAB — HEMOGLOBIN: Hemoglobin: 8.3 g/dL — ABNORMAL LOW (ref 12.0–15.0)

## 2013-12-07 NOTE — Telephone Encounter (Signed)
Patient came to the office for lab work today. Patient complained of falling getting off of bed last Thursday night. Patient thinks that she may have another cracked rib. After speaking with Dr. Danise Mina advised patient that she needs to go to an Urgent Care or schedule appointment for tomorrow. Patient stated that she will come back tomorrow. Patient's husband asked what to do about the cut on her left arm?  Was advised by patient that they had cleaned the area and used neosporin on it.  After looking at the cut  advised patient and husband that she should be seen today at an Urgent Care for the laceration to be attended to. The patient agreed to go to [facility] for treatment.

## 2013-12-07 NOTE — Telephone Encounter (Signed)
Pt's husband is aware that we have samples of Advair ready to be picked up. Advised that we do not have samples of Nuvigil at this time. He states she doesn't need that any way.

## 2013-12-08 ENCOUNTER — Telehealth: Payer: Self-pay

## 2013-12-08 ENCOUNTER — Encounter: Payer: Self-pay | Admitting: *Deleted

## 2013-12-08 ENCOUNTER — Ambulatory Visit: Payer: Medicare Other | Admitting: Family Medicine

## 2013-12-08 NOTE — Telephone Encounter (Signed)
Dr Burt Knack wanted me to contact [name] Lair NP [facility]) and clarify if she would be drawing a BMP on the pt 12/09/13.  She said the pt's PCP Dr Dionisio Paschal did labs yesterday.  We reviewed those labs and her BUN and Creatinine were improved.  [name] said she had advised the pt to decrease lasix to 60mg  in the morning and 40mg  in the evening due to lightheadedness and frequent falls.  [name] is scheduled to visit the pt tomorrow. No other changes at this time per Dr Burt Knack.

## 2013-12-08 NOTE — Telephone Encounter (Signed)
Noted. Agree with note .  Do not see [facility] records.

## 2013-12-08 NOTE — Telephone Encounter (Signed)
Pt left vm requesting lab results from 12/07/13. Patient notified as instructed by result note; pt voiced understanding.

## 2013-12-10 ENCOUNTER — Ambulatory Visit (INDEPENDENT_AMBULATORY_CARE_PROVIDER_SITE_OTHER): Payer: Medicare Other | Admitting: Internal Medicine

## 2013-12-10 ENCOUNTER — Encounter: Payer: Self-pay | Admitting: Internal Medicine

## 2013-12-10 ENCOUNTER — Telehealth: Payer: Self-pay

## 2013-12-10 VITALS — BP 112/62 | HR 55 | Ht 64.0 in | Wt 231.7 lb

## 2013-12-10 DIAGNOSIS — D500 Iron deficiency anemia secondary to blood loss (chronic): Secondary | ICD-10-CM

## 2013-12-10 DIAGNOSIS — K31819 Angiodysplasia of stomach and duodenum without bleeding: Secondary | ICD-10-CM

## 2013-12-10 DIAGNOSIS — K766 Portal hypertension: Secondary | ICD-10-CM

## 2013-12-10 DIAGNOSIS — D509 Iron deficiency anemia, unspecified: Secondary | ICD-10-CM

## 2013-12-10 NOTE — Patient Instructions (Signed)
Dr Lynnae Sandhoff, Dr Lenna Gilford

## 2013-12-10 NOTE — Telephone Encounter (Signed)
[name] with [facility] left v/m requesting dosage for Aranesp injection. Orders for Aranesp to be given every 2 weeks received but needs dosage. Pt scheduled to received Aranesp on 12/13/13. [name] request cb.

## 2013-12-10 NOTE — Progress Notes (Signed)
[name] 1940-10-23 [id number]  Note: This dictation was prepared with Dragon digital system. Any transcriptional errors that result from this procedure are unintentional.   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 She has never had a liver biopsy to prove it. 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 These have been ablated endoscopically on multiple occasions. A small bowel capsule endoscopy in June 2014, confirmed multiple small bowel AVMs. She has oxygen-dependent COPD and congestive heart failure. Her hemoglobin dropped to 7.6 and she was transfused 2 days ago to a hemoglobin of 8.3. She is feeling much better after the transfusion. She has mild renal insufficiency with a creatinine of 1.3 and BUN of 33.    Past Medical History  Diagnosis Date  . Chronic airway obstruction, not elsewhere classified   . Unspecified chronic bronchitis   . Unspecified essential hypertension   . Coronary atherosclerosis of unspecified type of vessel, native or graft   . Palpitations   . Aortic valve disorders   . Pure hypercholesterolemia   . Morbid obesity   . Esophageal reflux   . Angiodysplasia of intestine (without mention of hemorrhage)   . Diverticulosis of colon (without mention of hemorrhage)   . Benign neoplasm of colon   . Autoimmune hepatitis   . Cystitis, unspecified   . Osteoarthrosis, unspecified whether generalized or localized, unspecified site   . Osteoporosis, unspecified   . Restless legs syndrome (RLS)   . Major depressive disorder, recurrent episode, severe, specified as with psychotic behavior   . Iron deficiency anemia secondary to blood loss (chronic)   . Coarse tremors     "just in my arms"  . Carpal tunnel syndrome on both sides   . Dysrhythmia     palpitations  . CHF (congestive heart failure)    . Asthma   . Shortness of breath     "any time really"  . Sleep apnea     "but I don't use the equipment"  . Type II diabetes mellitus   . Blood transfusion   . H/O hiatal hernia   . 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 Adenomatous colon polyp   . Anginal pain   . Anxiety   . Pneumonia   . Heart murmur   . AVM (arteriovenous malformation)   . Portal hypertensive gastropathy   . Falls frequently   . UTI (lower urinary tract infection)   . Hiatal hernia   . GAVE (gastric antral vascular ectasia)     Past Surgical History  Procedure Laterality Date  . Hemorroidectomy    . Appendectomy    . Vaginal hysterectomy    . Breast biopsy      bilateral  . Cesarean section      x 3  . Appendectomy    . Esophagogastroduodenoscopy  06/12/2012    Procedure: ESOPHAGOGASTRODUODENOSCOPY (EGD);  Surgeon: Lafayette Dragon, MD;  Location: [facility];  Service: Endoscopy;  Laterality: N/A;  . Balloon dilation  06/12/2012    Procedure: BALLOON DILATION;  Surgeon: Lafayette Dragon, MD;  Location: [facility];  Service: Endoscopy;  Laterality: N/A;  ?balloon  . Esophagogastroduodenoscopy  09/10/2012    Procedure: ESOPHAGOGASTRODUODENOSCOPY (EGD);  Surgeon: Lafayette Dragon, MD;  Location: [facility];  Service: Endoscopy;  Laterality: N/A;  . Hot hemostasis  09/10/2012    Procedure: HOT HEMOSTASIS (  ARGON PLASMA COAGULATION/BICAP);  Surgeon: Lafayette Dragon, MD;  Location: [facility];  Service: Endoscopy;  Laterality: N/A;  . Esophagogastroduodenoscopy N/A 01/18/2013    Procedure: ESOPHAGOGASTRODUODENOSCOPY (EGD);  Surgeon: Lafayette Dragon, MD;  Location: [facility];  Service: Endoscopy;  Laterality: N/A;  . Esophagogastroduodenoscopy N/A 05/24/2013    Procedure: ESOPHAGOGASTRODUODENOSCOPY (EGD);  Surgeon: Jerene Bears, MD;  Location: [place];  Service: Gastroenterology;  Laterality: N/A;  . Hot hemostasis N/A 05/24/2013    Procedure: HOT HEMOSTASIS (ARGON PLASMA COAGULATION/BICAP);  Surgeon: Jerene Bears, MD;  Location: [place];  Service: Gastroenterology;  Laterality: N/A;    Allergies  Allergen Reactions  . Aspirin Other (See Comments)    Causes nosebleeds  . Penicillins Itching  . Theophylline Nausea And Vomiting    Family history and social history have been reviewed.  Review of Systems: Denies heartburn dysphagia. Reports improved energy with transfusion  The remainder of the 10 point ROS is negative except as outlined in the H&P  Physical Exam: General Appearance Well developed, in no distress overweight Eyes  Non icteric  HEENT  Non traumatic, normocephalic  Mouth No lesion, tongue papillated, no cheilosis Neck Supple without adenopathy, thyroid not enlarged, no carotid bruits, no JVD Lungs Clear to auscultation bilaterally COR Normal S1, normal S2, regular rhythm, 2/6 systolic murmur, quiet precordium Abdomen obese soft with hepatomegaly. No ascites. Rectal not done today Extremities  1+ pedal edema Skin No lesions Neurological Alert and oriented x 3, no asterixis Psychological Normal mood and affect  Assessment and Plan:   Problem #1 Chronic GI blood loss from avm"s and end-stage liver disease requiring blood transfusions. Chronic anemia- multifactorial- on Aranesp.If hemoglobin drops to 8 g, she should be transfused. She is followed by Dr. Lynnae Sandhoff who has been following  her hemoglobin. Next H&H will be on 12/14/2013. We will be checking her venous ammonia and prothrombin time on April 28th. I will see her in 2 months. Problem #2_ chronic liver disease with portal hypertension and satisfactory synthetic function as per nl PT, ammonia,serum alb of 3.0, no hx of variceal bleeding or ascites.   Lafayette Dragon 12/10/2013

## 2013-12-10 NOTE — Telephone Encounter (Signed)
I called and clarified.  She had been getting 25mcg. Would continue as is. Verbal order given over the phone.  [name] took the clarification.

## 2013-12-13 ENCOUNTER — Telehealth: Payer: Self-pay | Admitting: Family Medicine

## 2013-12-13 ENCOUNTER — Other Ambulatory Visit (HOSPITAL_COMMUNITY): Payer: Self-pay | Admitting: *Deleted

## 2013-12-13 ENCOUNTER — Encounter (HOSPITAL_COMMUNITY)
Admission: RE | Admit: 2013-12-13 | Discharge: 2013-12-13 | Disposition: A | Discharge status: Home / Self Care | Payer: Medicare Other | Source: Ambulatory Visit | Attending: Pulmonary Disease | Admitting: Pulmonary Disease

## 2013-12-13 LAB — PREPARE RBC (CROSSMATCH)

## 2013-12-13 LAB — POCT HEMOGLOBIN-HEMACUE: HEMOGLOBIN: 7.7 g/dL — AB (ref 12.0–15.0)

## 2013-12-13 MED ORDER — DARBEPOETIN ALFA-POLYSORBATE 200 MCG/0.4ML IJ SOLN
200.0000 ug | INTRAMUSCULAR | Status: DC
  Administered 2013-12-13: 200 ug via SUBCUTANEOUS

## 2013-12-13 MED ORDER — DARBEPOETIN ALFA-POLYSORBATE 200 MCG/0.4ML IJ SOLN
INTRAMUSCULAR | Status: AC
  Filled 2013-12-13: qty 0.4

## 2013-12-13 NOTE — Telephone Encounter (Signed)
D/w [name] at short stay.  HBG noted, <8.  Verbal order given for T&C 1 unit PRBC with 20mg  IV lasix once before transfusion.   Will be done tomorrow.  Aranesp given today.   Routed to Dr. Olevia Perches as a FYI.   Done the absence of Dr. Danise Mina, as he is out of town.

## 2013-12-13 NOTE — Progress Notes (Signed)
Pt's hgb today 7.7 pt acomplained of SOB and black stools,  called and reported to Dr Damita Dunnings and orders received to transfuse patient tomorrow.  Dr Damita Dunnings stated that he would send a note to Dr Olevia Perches.

## 2013-12-14 ENCOUNTER — Encounter (HOSPITAL_COMMUNITY)
Admission: RE | Admit: 2013-12-14 | Discharge: 2013-12-14 | Disposition: A | Discharge status: Home / Self Care | Payer: Medicare Other | Source: Ambulatory Visit | Attending: Family Medicine | Admitting: Family Medicine

## 2013-12-14 ENCOUNTER — Ambulatory Visit: Payer: Medicare Other | Admitting: Endocrinology

## 2013-12-14 MED ORDER — FUROSEMIDE 10 MG/ML IJ SOLN
20.0000 mg | Freq: Once | INTRAMUSCULAR | Status: AC
  Administered 2013-12-14: 20 mg via INTRAVENOUS
  Filled 2013-12-14: qty 2

## 2013-12-15 ENCOUNTER — Telehealth: Payer: Self-pay

## 2013-12-15 LAB — TYPE AND SCREEN
ABO/RH(D): O POS
ANTIBODY SCREEN: POSITIVE
DAT, IgG: NEGATIVE
DONOR AG TYPE: NEGATIVE
Unit division: 0

## 2013-12-15 NOTE — Telephone Encounter (Signed)
Relevant patient education mailed to patient.  

## 2013-12-20 ENCOUNTER — Ambulatory Visit (INDEPENDENT_AMBULATORY_CARE_PROVIDER_SITE_OTHER): Payer: Medicare Other | Admitting: Family Medicine

## 2013-12-20 ENCOUNTER — Encounter: Payer: Self-pay | Admitting: Family Medicine

## 2013-12-20 VITALS — BP 134/72 | HR 60 | Temp 98.8°F | Wt 230.8 lb

## 2013-12-20 DIAGNOSIS — D500 Iron deficiency anemia secondary to blood loss (chronic): Secondary | ICD-10-CM

## 2013-12-20 DIAGNOSIS — N289 Disorder of kidney and ureter, unspecified: Secondary | ICD-10-CM

## 2013-12-20 DIAGNOSIS — K754 Autoimmune hepatitis: Secondary | ICD-10-CM

## 2013-12-20 DIAGNOSIS — K7460 Unspecified cirrhosis of liver: Secondary | ICD-10-CM

## 2013-12-20 DIAGNOSIS — I359 Nonrheumatic aortic valve disorder, unspecified: Secondary | ICD-10-CM

## 2013-12-20 DIAGNOSIS — R5381 Other malaise: Secondary | ICD-10-CM

## 2013-12-20 DIAGNOSIS — F333 Major depressive disorder, recurrent, severe with psychotic symptoms: Secondary | ICD-10-CM

## 2013-12-20 DIAGNOSIS — I509 Heart failure, unspecified: Secondary | ICD-10-CM

## 2013-12-20 DIAGNOSIS — I5032 Chronic diastolic (congestive) heart failure: Secondary | ICD-10-CM

## 2013-12-20 DIAGNOSIS — K31819 Angiodysplasia of stomach and duodenum without bleeding: Secondary | ICD-10-CM

## 2013-12-20 DIAGNOSIS — I10 Essential (primary) hypertension: Secondary | ICD-10-CM

## 2013-12-20 DIAGNOSIS — I2510 Atherosclerotic heart disease of native coronary artery without angina pectoris: Secondary | ICD-10-CM

## 2013-12-20 NOTE — Assessment & Plan Note (Signed)
Check PT/INR and ammonium level today. Will need to assess Hep A/B immunity status. Reviewed ill state of patient with multiple organ systems affected.

## 2013-12-20 NOTE — Progress Notes (Signed)
BP 134/72  Pulse 60  Temp(Src) 98.8 F (37.1 C) (Oral)  Wt 230 lb 12 oz (104.668 kg)  SpO2 97%   CC: 1 mo f/u  Subjective:    Patient ID: [name], female    DOB: 01-Sep-1940, 73 y.o.   MRN: [id number]  HPI: [name] is a 73 y.o. female presenting on 12/20/2013 for Follow-up   Complicated pt who established with me last month. She has history of chronic anemia due to gastric antral vascular ectasia, autoimmune hepatitis leading to cirrhosis (with NASH contributing) with hypertensive gastropathy, CAD, chronic diastolic CHF, COPD, major depression with psychosis, diabetes, HTN and HLD, and restless leg syndrome.  Not feeling well today - didn't feel any better after blood transfusion last week.  Having some epigastric discomfort described as occasional choking, not exertion related, no pressure/tightness.  Having R knee pain from fall out of bed.  Having dyspnea with any exertion but not at rest.  Advised to decrease lasix 4/15 but has not done this yet.  Denies fevers/chills. Having black stools - chronic with known small bowel AVMs.  On aranesp infusions every 2 weeks.  Over last 2 weeks has needed 2 separate blood transfusions of 1 u pRBC 2/2 anemia with Hgb <8 (4/13 then again 4/21). Lab Results  Component Value Date   HGB 7.7* 12/13/2013    Deloria Lair RN with [facility] is her care management coordinator. Per Dr. Olevia Perches rec draw PT, BMET, ammonia and iron studies, I will draw today, I will check CBC as well today.  Will route results to Dr. Olevia Perches and cardiology.  Has had PT come out last year - didn't feel this significantly helped. Has not seen hematologist. Main frustration is decreased mobility - along with persistent fatigue.  Relevant past medical, surgical, family and social history reviewed and updated as indicated.  Allergies and medications reviewed and updated. Current Outpatient Prescriptions on File Prior to Visit  Medication Sig  . albuterol (PROAIR  HFA) 108 (90 BASE) MCG/ACT inhaler Inhale 2 puffs into the lungs every 6 (six) hours as needed for wheezing or shortness of breath.   . ALPRAZolam (XANAX) 1 MG tablet Take 1 mg by mouth 3 (three) times daily as needed for anxiety.   . AMBULATORY NON FORMULARY MEDICATION Continuous O2 @@ 3LMP  . b complex vitamins tablet Take 1 tablet by mouth daily.   . B-D ULTRAFINE III SHORT PEN 31G X 8 MM MISC USE AS DIRECTED  . Blood Glucose Monitoring Suppl (ONE TOUCH ULTRA MINI) W/DEVICE KIT Test blood sugar up to three times daily  . buPROPion (WELLBUTRIN XL) 150 MG 24 hr tablet Take 150 mg by mouth daily.  [name] desvenlafaxine (PRISTIQ) 100 MG 24 hr tablet Take 100 mg by mouth daily. Takes with a 18m tablet, total daily dose is 154m . desvenlafaxine (PRISTIQ) 50 MG 24 hr tablet Take 50 mg by mouth daily. Takes with a 10013mablet, total daily dose is 150m46m dextromethorphan (DELSYM) 30 MG/5ML liquid Take 60 mg by mouth at bedtime.   . Fluticasone-Salmeterol (ADVAIR DISKUS) 250-50 MCG/DOSE AEPB INHALE 1 PUFF INTO THE LUNGS 2 (TWO) TIMES DAILY.  . furosemide (LASIX) 40 MG tablet Take 1 1/2 tablets (60 mg) in the morning and 1 tablet in the afternoon  . glucose blood (ONE TOUCH TEST STRIPS) test strip Test blood sugar up to three times daily  . Insulin Glargine (LANTUS SOLOSTAR [place]) Inject 140 Units into the skin daily before  supper.   . insulin lispro (HUMALOG) 100 UNIT/ML KiwkPen Inject 20 Units into the skin daily with supper.  . lactulose (CHRONULAC) 10 GM/15ML solution TAKE 30 MLS BY MOUTH DAILY  . Lancets (ONETOUCH ULTRASOFT) lancets Test blood sugar up to three times daily  . levalbuterol (XOPENEX) 0.63 MG/3ML nebulizer solution Take 0.63 mg by nebulization every 4 (four) hours as needed for wheezing or shortness of breath.  . lidocaine (LIDODERM) 5 % Place 1 patch onto the skin daily as needed (apply to right shoulder or right back rib area for pain). Remove & Discard patch within 12 hours or as  directed by MD  . lisinopril (PRINIVIL,ZESTRIL) 5 MG tablet Take 5 mg by mouth at bedtime.   . Multiple Vitamins-Minerals (CENTRUM SILVER PO) Take by mouth daily.  . nadolol (CORGARD) 20 MG tablet TAKE 1 TABLET BY MOUTH EVERY DAY  . pantoprazole (PROTONIX) 40 MG tablet Take 1 tablet (40 mg total) by mouth 2 (two) times daily.  . polyethylene glycol (MIRALAX / GLYCOLAX) packet Take 17 g by mouth daily as needed (constipation).  . promethazine (PHENERGAN) 25 MG tablet Take 1 tablet (25 mg total) by mouth 2 (two) times daily as needed for nausea.  [name] QUEtiapine (SEROQUEL) 400 MG tablet Take 400 mg by mouth at bedtime.  [name] rOPINIRole (REQUIP) 4 MG tablet Take 4 mg by mouth at bedtime.  . Saline (OCEAN NASAL SPRAY NA) Place 1 spray into the nose daily as needed (congestion).  . Simethicone (GAS-X PO) Take 1 tablet by mouth daily as needed (flatulence).   . simvastatin (ZOCOR) 40 MG tablet TAKE 1 TABLET BY MOUTH AT BEDTIME FOR CHOLESTEROL  . spironolactone (ALDACTONE) 12.5 mg TABS tablet Take 0.5 tablets (12.5 mg total) by mouth daily.  . Vitamin D, Ergocalciferol, (DRISDOL) 50000 UNITS CAPS capsule Take 50,000 Units by mouth every 7 (seven) days. On fridays   No current facility-administered medications on file prior to visit.    Review of Systems Per HPI unless specifically indicated above    Objective:    BP 134/72  Pulse 60  Temp(Src) 98.8 F (37.1 C) (Oral)  Wt 230 lb 12 oz (104.668 kg)  SpO2 97%  Physical Exam  Nursing note and vitals reviewed. Constitutional: She appears well-developed and well-nourished. No distress.  HENT:  Mouth/Throat: Oropharynx is clear and moist. No oropharyngeal exudate.  Eyes: Conjunctivae and EOM are normal. Pupils are equal, round, and reactive to light. No scleral icterus.  Cardiovascular: Normal rate, regular rhythm and intact distal pulses.   Murmur (4/6 SEM best at LUSB) heard. Pulmonary/Chest: Effort normal. No respiratory distress. She has wheezes  (minimal RLL wheezing). She has no rales.  Mild LLL crackles  Musculoskeletal: She exhibits no edema.  Skin: Skin is warm and dry. No rash noted.       Assessment & Plan:   Problem List Items Addressed This Visit   IRON DEFICIENCY ANEMIA SECONDARY TO BLOOD LOSS (Chronic)     Recheck CBC and iron panel today.   Continue procrit injections Q2 weeks as well as blood transfusion if Hgb <8. Will consider hematology referral to discuss other treatment options.    Relevant Orders      CBC with Differential      IBC panel      Ferritin   DEPRESSION, MAJOR, RECURRENT, SEVERE, W/PSYCHOTIC BEHAVIOR (Chronic)     Continue pristiq and wellbutrin and seroquel.    HYPERTENSION (Chronic)   CAD (Chronic)     F/u scheduled  with cards for tomorrow.    AORTIC STENOSIS (Chronic)     Followed by cards.  Last echo 01/2013 with mild AS and diastolic CHF with preserved EF.  May be able to tolerate 2 units at once given no significant sCHF hx.    Autoimmune hepatitis (Chronic)   Chronic diastolic CHF (congestive heart failure) (Chronic)     No signs of fluid overload today.  Discussed decreased lasix dose per prior cards recs.  Pt will decrease lasix to 58m in am and 464min pm.    Physical deconditioning (Chronic)     Pt's main concern today was decreased mobility.  Consider evaluation for motorized wheelchair or scooter.    Cirrhosis of liver without mention of alcohol - Primary (Chronic)     Check PT/INR and ammonium level today. Will need to assess Hep A/B immunity status. Reviewed ill state of patient with multiple organ systems affected.    Relevant Orders      Protime-INR      AMMONIA   GAVE (gastric antral vascular ectasia)   Relevant Orders      AMMONIA   Renal insufficiency     Seems stage 3.  Merits close follow up.    Relevant Orders      Basic metabolic panel      CBC with Differential       Follow up plan: Return in about 1 month (around 01/19/2014), or if symptoms worsen  or fail to improve, for follow up visit.

## 2013-12-20 NOTE — Progress Notes (Signed)
Pre visit review using our clinic review tool, if applicable. No additional management support is needed unless otherwise documented below in the visit note. 

## 2013-12-20 NOTE — Assessment & Plan Note (Signed)
Continue pristiq and wellbutrin and seroquel.

## 2013-12-20 NOTE — Assessment & Plan Note (Signed)
Pt's main concern today was decreased mobility.  Consider evaluation for motorized wheelchair or scooter.

## 2013-12-20 NOTE — Assessment & Plan Note (Signed)
Seems stage 3.  Merits close follow up.

## 2013-12-20 NOTE — Patient Instructions (Addendum)
Decrease lasix 40mg  - to 1 1/2 tablets in am and 1 tablet in pm. Blood work today - to check kidneys and blood count. I will send results to Richardson Dopp and Dr. Olevia Perches. Keep appointments as up to now.  No other changes today.

## 2013-12-20 NOTE — Assessment & Plan Note (Signed)
Recheck CBC and iron panel today.   Continue procrit injections Q2 weeks as well as blood transfusion if Hgb <8. Will consider hematology referral to discuss other treatment options.

## 2013-12-20 NOTE — Assessment & Plan Note (Signed)
No signs of fluid overload today.  Discussed decreased lasix dose per prior cards recs.  Pt will decrease lasix to 60mg  in am and 40mg  in pm.

## 2013-12-20 NOTE — Assessment & Plan Note (Addendum)
Followed by cards.  Last echo 01/2013 with mild AS and diastolic CHF with preserved EF.  May be able to tolerate 2 units at once given no significant sCHF hx.

## 2013-12-20 NOTE — Assessment & Plan Note (Signed)
F/u scheduled with cards for tomorrow.

## 2013-12-21 ENCOUNTER — Ambulatory Visit (INDEPENDENT_AMBULATORY_CARE_PROVIDER_SITE_OTHER): Payer: Medicare Other | Admitting: Physician Assistant

## 2013-12-21 ENCOUNTER — Encounter: Payer: Self-pay | Admitting: Physician Assistant

## 2013-12-21 VITALS — BP 138/72 | HR 56 | Ht 64.0 in | Wt 230.0 lb

## 2013-12-21 DIAGNOSIS — K31819 Angiodysplasia of stomach and duodenum without bleeding: Secondary | ICD-10-CM

## 2013-12-21 DIAGNOSIS — I509 Heart failure, unspecified: Secondary | ICD-10-CM

## 2013-12-21 DIAGNOSIS — R042 Hemoptysis: Secondary | ICD-10-CM

## 2013-12-21 DIAGNOSIS — R0600 Dyspnea, unspecified: Secondary | ICD-10-CM

## 2013-12-21 DIAGNOSIS — R0609 Other forms of dyspnea: Secondary | ICD-10-CM

## 2013-12-21 DIAGNOSIS — I5032 Chronic diastolic (congestive) heart failure: Secondary | ICD-10-CM

## 2013-12-21 DIAGNOSIS — I10 Essential (primary) hypertension: Secondary | ICD-10-CM

## 2013-12-21 DIAGNOSIS — D509 Iron deficiency anemia, unspecified: Secondary | ICD-10-CM

## 2013-12-21 DIAGNOSIS — I359 Nonrheumatic aortic valve disorder, unspecified: Secondary | ICD-10-CM

## 2013-12-21 DIAGNOSIS — I2510 Atherosclerotic heart disease of native coronary artery without angina pectoris: Secondary | ICD-10-CM

## 2013-12-21 DIAGNOSIS — R0989 Other specified symptoms and signs involving the circulatory and respiratory systems: Secondary | ICD-10-CM

## 2013-12-21 LAB — BASIC METABOLIC PANEL
BUN: 45 mg/dL — AB (ref 6–23)
CALCIUM: 9.1 mg/dL (ref 8.4–10.5)
CO2: 26 meq/L (ref 19–32)
CREATININE: 1.5 mg/dL — AB (ref 0.4–1.2)
Chloride: 102 mEq/L (ref 96–112)
GFR: 35.89 mL/min — ABNORMAL LOW (ref >60.00)
Glucose, Bld: 200 mg/dL — ABNORMAL HIGH (ref 70–99)
Potassium: 4.7 mEq/L (ref 3.5–5.1)
Sodium: 138 mEq/L (ref 135–145)

## 2013-12-21 LAB — IBC PANEL
IRON: 103 ug/dL (ref 42–145)
SATURATION RATIOS: 26.3 % (ref 20.0–50.0)
TRANSFERRIN: 280 mg/dL (ref 212.0–360.0)

## 2013-12-21 LAB — PROTIME-INR
INR: 1 ratio (ref 0.8–1.0)
PROTHROMBIN TIME: 11.6 s (ref 9.6–13.1)

## 2013-12-21 LAB — CBC WITH DIFFERENTIAL/PLATELET
BASOS PCT: 0.4 % (ref 0.0–3.0)
Basophils Absolute: 0 10*3/uL (ref 0.0–0.1)
EOS ABS: 0.2 10*3/uL (ref 0.0–0.7)
Eosinophils Relative: 4.8 % (ref 0.0–5.0)
HCT: 28.2 % — ABNORMAL LOW (ref 36.0–46.0)
Hemoglobin: 9.4 g/dL — ABNORMAL LOW (ref 12.0–15.0)
LYMPHS PCT: 8.2 % — AB (ref 12.0–46.0)
Lymphs Abs: 0.3 10*3/uL — ABNORMAL LOW (ref 0.7–4.0)
MCHC: 33.3 g/dL (ref 30.0–36.0)
MCV: 101.4 fl — AB (ref 78.0–100.0)
MONO ABS: 0.4 10*3/uL (ref 0.1–1.0)
Monocytes Relative: 9.2 % (ref 3.0–12.0)
NEUTROS PCT: 77.4 % — AB (ref 43.0–77.0)
Neutro Abs: 3 10*3/uL (ref 1.4–7.7)
Platelets: 91 10*3/uL — ABNORMAL LOW (ref 150.0–400.0)
RBC: 2.78 Mil/uL — AB (ref 3.87–5.11)
RDW: 16.4 % — ABNORMAL HIGH (ref 11.5–14.6)
WBC: 3.9 10*3/uL — ABNORMAL LOW (ref 4.5–10.5)

## 2013-12-21 LAB — FERRITIN: Ferritin: 45.4 ng/mL (ref 10.0–291.0)

## 2013-12-21 LAB — AMMONIA: Ammonia: 78 umol/L — ABNORMAL HIGH (ref 16–53)

## 2013-12-21 NOTE — Progress Notes (Signed)
[address], [place],   [address] Phone: [phone] Fax:  [phone]  Date:  12/21/2013   ID:  [name], DOB 1941-06-26, MRN [id number]  PCP:  Ria Bush, MD  Cardiologist:  Dr. Sherren Mocha     History of Present Illness: [name] is a 73 y.o. 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  LHC 11/2007: Dx 20%, proximal and mid RCA 20-30%, EF 50-55%.  Echo 01/2013: EF 08-14%, grade 2 diastolic dysfunction, very mild aortic stenosis, mean gradient 9, mild AI, mild BAE, PASP 39.  Admitted for a/c diastolic CHF 11/8183 and then seen in the ED in 04/2013 with volume excess.    Admitted 04/2013 with worsening anemia. She was transfused with PRBCs. EGD demonstrated GAVE which was treated with APC.  Of note, atenolol was changed to a non-selective beta blocker due to better reduction in portal pressures.    Last seen by Dr. Sherren Mocha 11/23/13.  She was volume overloaded.  Lasix was adjusted and she was placed on Spironolactone.  She returns for close f/u.    Since seen she had worsening Hgb and was transfused with PRBCs with symptomatic improvement.  Of note, her Lasix was decreased due to lightheadedness and frequent falls (per [place]).  Her most recent transfusion with PRBCs was last week. She has not noted much difference since that time. She had lab work obtained yesterday with her PCP. CBC and basic metabolic panel results are pending. She continues to have dyspnea with exertion. She describes NYHA class III symptoms. She sleeps on 3 pillows. She has done this chronically without significant change recently. LE edema has improved. She denies PND. She does have occasional epigastric or substernal chest discomfort. This seems to occur prior to eating and is often worsened by eating. She has chronic  hematochezia and melena without significant change. She denies hematemesis. She recently feels as though she coughed up some blood.  Otherwise, she denies significant cough.  Recent Labs: 02/19/2013: TSH 1.541  10/03/2013: Pro B Natriuretic peptide (BNP) 207.5*  12/07/2013: ALT 33; Creatinine 1.3*; Potassium 4.3  12/13/2013: Hemoglobin 7.7*    Wt Readings from Last 3 Encounters:  12/21/13 230 lb (104.327 kg)  12/20/13 230 lb 12 oz (104.668 kg)  12/10/13 231 lb 11.2 oz (105.098 kg)     Past Medical History  Diagnosis Date  . Chronic airway obstruction, not elsewhere classified   . Unspecified chronic bronchitis   . Unspecified essential hypertension   . Coronary atherosclerosis of unspecified type of vessel, native or graft   . Palpitations   . Aortic valve disorders   . Pure hypercholesterolemia   . Morbid obesity   . Esophageal reflux   . Angiodysplasia of intestine (without mention of hemorrhage)   . Diverticulosis of colon (without mention of hemorrhage)   . Benign neoplasm of colon   . Autoimmune hepatitis   . Cystitis, unspecified   . Osteoarthrosis, unspecified whether generalized or localized, unspecified site   . Osteoporosis, unspecified   . Restless legs syndrome (RLS)   . Major depressive disorder, recurrent episode, severe, specified as with psychotic behavior   . Iron deficiency anemia secondary to blood loss (chronic)   . Coarse tremors     "just in my arms"  . Carpal tunnel syndrome on both sides   .  Dysrhythmia     palpitations  . CHF (congestive heart failure)   . Asthma   . Shortness of breath     "any time really"  . Sleep apnea     "but I don't use the equipment"  . Type II diabetes mellitus   . Blood transfusion   . H/O hiatal hernia   . 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 Adenomatous colon polyp   . Anginal pain   . Anxiety   . Pneumonia   . Heart murmur   . AVM (arteriovenous malformation)   . Portal hypertensive gastropathy   . Falls  frequently   . UTI (lower urinary tract infection)   . Hiatal hernia   . GAVE (gastric antral vascular ectasia)     Current Outpatient Prescriptions  Medication Sig Dispense Refill  . albuterol (PROAIR HFA) 108 (90 BASE) MCG/ACT inhaler Inhale 2 puffs into the lungs every 6 (six) hours as needed for wheezing or shortness of breath.       . ALPRAZolam (XANAX) 1 MG tablet Take 1 mg by mouth 3 (three) times daily as needed for anxiety.       . AMBULATORY NON FORMULARY MEDICATION Continuous O2 @@ 3LMP      . b complex vitamins tablet Take 1 tablet by mouth daily.       . B-D ULTRAFINE III SHORT PEN 31G X 8 MM MISC USE AS DIRECTED  100 each  3  . Blood Glucose Monitoring Suppl (ONE TOUCH ULTRA MINI) W/DEVICE KIT Test blood sugar up to three times daily  1 each  0  . buPROPion (WELLBUTRIN XL) 150 MG 24 hr tablet Take 150 mg by mouth daily.      [name] desvenlafaxine (PRISTIQ) 100 MG 24 hr tablet Take 100 mg by mouth daily. Takes with a 73m tablet, total daily dose is 1568m     . desvenlafaxine (PRISTIQ) 50 MG 24 hr tablet Take 50 mg by mouth daily. Takes with a 10015mablet, total daily dose is 150m59m   . dextromethorphan (DELSYM) 30 MG/5ML liquid Take 60 mg by mouth at bedtime.       . Fluticasone-Salmeterol (ADVAIR DISKUS) 250-50 MCG/DOSE AEPB INHALE 1 PUFF INTO THE LUNGS 2 (TWO) TIMES DAILY.  14 each  0  . furosemide (LASIX) 40 MG tablet Take 1 1/2 tablets (60 mg) in the morning and 1 tablet in the afternoon  30 tablet    . glucose blood (ONE TOUCH TEST STRIPS) test strip Test blood sugar up to three times daily  100 each  12  . Insulin Glargine (LANTUS SOLOSTAR [place]) Inject 140 Units into the skin daily before supper.       . insulin lispro (HUMALOG) 100 UNIT/ML KiwkPen Inject 20 Units into the skin daily with supper.      . lactulose (CHRONULAC) 10 GM/15ML solution TAKE 30 MLS BY MOUTH DAILY  960 mL  0  . Lancets (ONETOUCH ULTRASOFT) lancets Test blood sugar up to three times daily  100 each  12    . levalbuterol (XOPENEX) 0.63 MG/3ML nebulizer solution Take 0.63 mg by nebulization every 4 (four) hours as needed for wheezing or shortness of breath.      . lidocaine (LIDODERM) 5 % Place 1 patch onto the skin daily as needed (apply to right shoulder or right back rib area for pain). Remove & Discard patch within 12 hours or as directed by MD      .  lisinopril (PRINIVIL,ZESTRIL) 5 MG tablet Take 5 mg by mouth at bedtime.       . Multiple Vitamins-Minerals (CENTRUM SILVER PO) Take by mouth daily.      . nadolol (CORGARD) 20 MG tablet TAKE 1 TABLET BY MOUTH EVERY DAY  30 tablet  4  . pantoprazole (PROTONIX) 40 MG tablet Take 1 tablet (40 mg total) by mouth 2 (two) times daily.  60 tablet  11  . polyethylene glycol (MIRALAX / GLYCOLAX) packet Take 17 g by mouth daily as needed (constipation).      . promethazine (PHENERGAN) 25 MG tablet Take 1 tablet (25 mg total) by mouth 2 (two) times daily as needed for nausea.  30 tablet  5  . QUEtiapine (SEROQUEL) 400 MG tablet Take 400 mg by mouth at bedtime.      [name] rOPINIRole (REQUIP) 4 MG tablet Take 4 mg by mouth at bedtime.      . Saline (OCEAN NASAL SPRAY NA) Place 1 spray into the nose daily as needed (congestion).      . Simethicone (GAS-X PO) Take 1 tablet by mouth daily as needed (flatulence).       . simvastatin (ZOCOR) 40 MG tablet TAKE 1 TABLET BY MOUTH AT BEDTIME FOR CHOLESTEROL  30 tablet  6  . spironolactone (ALDACTONE) 12.5 mg TABS tablet Take 0.5 tablets (12.5 mg total) by mouth daily.  30 tablet  6  . Vitamin D, Ergocalciferol, (DRISDOL) 50000 UNITS CAPS capsule Take 50,000 Units by mouth every 7 (seven) days. On fridays       No current facility-administered medications for this visit.    Allergies:    Allergies  Allergen Reactions  . Aspirin Other (See Comments)    Causes nosebleeds  . Penicillins Itching  . Theophylline Nausea And Vomiting    Social History:  The patient  reports that she quit smoking about 21 years ago. Her  smoking use included Cigarettes. She has a 37.5 pack-year smoking history. She has never used smokeless tobacco. She reports that she does not drink alcohol or use illicit drugs.   ROS:  Please see the history of present illness.    All other systems reviewed and negative.   PHYSICAL EXAM: VS:  BP 138/72  Pulse 56  Ht _0  (1.626 m)  Wt 230 lb (104.327 kg)  BMI 39.46 kg/m2 Well nourished, well developed, in no acute distress HEENT: normal Neck: no JVD at 90  Cardiac:  normal S1, S2; RRR; 8-2/4 systolic murmur at the RUSB Lungs:  Decreased breath sounds bilaterally, no wheezing, rhonchi or rales Abd: distended, nontender, no hepatomegaly Ext: very trace bilateral LE edema Skin: warm and dry Neuro:  CNs 2-12 intact, no focal abnormalities noted  EKG:   Sinus bradycardia, HR 56, first degree AV block (PR 248), LAD, septal Q waves, no significant change since prior tracing   ASSESSMENT AND PLAN:  1. Dyspnea:  Her dyspnea is multifactorial. I suspect her biggest contributing factor at this point in time is her anemia. She has received several transfusions in the last few weeks. From a volume standpoint, she appears to be improved and stable. Her diuretic was recently adjusted. She should continue her current dose. Basic metabolic panel is pending from yesterday. 2. Chronic Diastolic CHF:  As noted, volume improved. Basic metabolic panel pending. Continue current dose of spironolactone and Lasix, which is 60 mg in the morning and 40 mg in the afternoon. 3. Hypertension:  Controlled. 4. CAD:  She has  noted recent episodes of epigastric and chest discomfort. This sounds more consistent with gastroesophageal reflux symptoms than ischemia. No further cardiac workup. Continue PPI. Follow up with GI as planned. Consider earlier f/u if symptoms worsen.  Continue statin. 5. Aortic Stenosis:  Very mild by recent echocardiogram. Continue clinical follow up. 6. COPD:  Continue followup with primary  care. 7. Anemia:  From chronic GI blood loss.  She is now receiving Aranesp injections and has had several transfusions with PRBCs as noted above. 8. GAVE:  F/u with GI as planned.  9. Hemoptysis: She recently coughed up something that looked as though it had blood in it. I will arrange a chest x-ray. 10. Disposition:  F/u with me in one month.   Signed, Richardson Dopp, PA-C  12/21/2013 11:33 AM

## 2013-12-21 NOTE — Patient Instructions (Signed)
Your physician recommends that you continue on your current medications as directed. Please refer to the Current Medication list given to you today.  A chest x-ray takes a picture of the organs and structures inside the chest, including the heart, lungs, and blood vessels. This test can show several things, including, whether the heart is enlarges; whether fluid is building up in the lungs; and whether pacemaker / defibrillator leads are still in place. THIS IS TO BE DONE EITHER TODAY OR TOMORROW AT [place] [facility]   PLEASE FOLLOW UP WITH [place], [facility] 01/19/14 @ 11:10

## 2013-12-22 ENCOUNTER — Ambulatory Visit (INDEPENDENT_AMBULATORY_CARE_PROVIDER_SITE_OTHER)
Admission: RE | Admit: 2013-12-22 | Discharge: 2013-12-22 | Disposition: A | Discharge status: Home / Self Care | Payer: Medicare Other | Source: Ambulatory Visit | Attending: Physician Assistant | Admitting: Physician Assistant

## 2013-12-22 DIAGNOSIS — R042 Hemoptysis: Secondary | ICD-10-CM

## 2013-12-23 ENCOUNTER — Telehealth: Payer: Self-pay | Admitting: *Deleted

## 2013-12-23 DIAGNOSIS — R0602 Shortness of breath: Secondary | ICD-10-CM

## 2013-12-23 DIAGNOSIS — I2510 Atherosclerotic heart disease of native coronary artery without angina pectoris: Secondary | ICD-10-CM

## 2013-12-23 DIAGNOSIS — I5032 Chronic diastolic (congestive) heart failure: Secondary | ICD-10-CM

## 2013-12-23 MED ORDER — RIFAXIMIN 550 MG PO TABS: 550.0000 mg | ORAL_TABLET | Freq: Every day | ORAL | Status: DC

## 2013-12-23 NOTE — Telephone Encounter (Signed)
pt notified about cxr results and the need for the repeat cxr due to possible technique error per Richardson Dopp, PA. Will get repeat cxr 5/4 at [facility] while pt is there for her procrit injection. Pt agreeable to Plan of Care.

## 2013-12-23 NOTE — Telephone Encounter (Signed)
Rx sent 

## 2013-12-23 NOTE — Telephone Encounter (Signed)
Unfortunately, patient's insurance does not cover Xifaxan. We do have #24 capsules of samples here at the office. Patient has been advised to get samples instead of script at the pharmacy. I have asked that she pick these up tomorrow and start taking them. She verbalizes understanding.

## 2013-12-23 NOTE — Addendum Note (Signed)
Addended by: Larina Bras on: 12/23/2013 01:38 PM   Modules accepted: Orders

## 2013-12-23 NOTE — Telephone Encounter (Signed)
Yes

## 2013-12-23 NOTE — Telephone Encounter (Signed)
Message copied by Larina Bras on Thu Dec 23, 2013  9:12 AM ------      Message from: [place] Dragon      Created: Wed Dec 22, 2013 10:26 PM       Please call pt with elevated ammonia. Please add Xifaxin 550mg , #20 1 po qd in addition to Lactulose which she already has. ------

## 2013-12-23 NOTE — Telephone Encounter (Signed)
Patient has been advised of elevated ammonia level and her need to add xifaxan to her current regimen.  Dr Olevia Perches- Just to be sure, you do want her to take Xifaxan only once daily and only #20? So is this going to be a short term script?

## 2013-12-24 ENCOUNTER — Telehealth: Payer: Self-pay | Admitting: Pulmonary Disease

## 2013-12-24 NOTE — Telephone Encounter (Signed)
Spoke with pt and advised him that samples of advair 250/50 would be left at the front desk to be picked up. Pt aware and nothing further needed

## 2013-12-27 ENCOUNTER — Ambulatory Visit (HOSPITAL_COMMUNITY)
Admission: RE | Admit: 2013-12-27 | Discharge: 2013-12-27 | Disposition: A | Discharge status: Home / Self Care | Payer: Medicare Other | Source: Ambulatory Visit | Attending: Physician Assistant | Admitting: Physician Assistant

## 2013-12-27 ENCOUNTER — Encounter (HOSPITAL_COMMUNITY)
Admission: RE | Admit: 2013-12-27 | Discharge: 2013-12-27 | Disposition: A | Discharge status: Home / Self Care | Payer: Medicare Other | Source: Ambulatory Visit | Attending: Pulmonary Disease | Admitting: Pulmonary Disease

## 2013-12-27 DIAGNOSIS — R0609 Other forms of dyspnea: Secondary | ICD-10-CM | POA: Insufficient documentation

## 2013-12-27 DIAGNOSIS — I2510 Atherosclerotic heart disease of native coronary artery without angina pectoris: Secondary | ICD-10-CM

## 2013-12-27 DIAGNOSIS — R0989 Other specified symptoms and signs involving the circulatory and respiratory systems: Secondary | ICD-10-CM | POA: Insufficient documentation

## 2013-12-27 DIAGNOSIS — D509 Iron deficiency anemia, unspecified: Secondary | ICD-10-CM | POA: Insufficient documentation

## 2013-12-27 DIAGNOSIS — J4489 Other specified chronic obstructive pulmonary disease: Secondary | ICD-10-CM | POA: Insufficient documentation

## 2013-12-27 DIAGNOSIS — I5032 Chronic diastolic (congestive) heart failure: Secondary | ICD-10-CM

## 2013-12-27 DIAGNOSIS — J449 Chronic obstructive pulmonary disease, unspecified: Secondary | ICD-10-CM | POA: Insufficient documentation

## 2013-12-27 DIAGNOSIS — R0602 Shortness of breath: Secondary | ICD-10-CM

## 2013-12-27 LAB — POCT HEMOGLOBIN-HEMACUE: HEMOGLOBIN: 8.8 g/dL — AB (ref 12.0–15.0)

## 2013-12-27 MED ORDER — DARBEPOETIN ALFA-POLYSORBATE 200 MCG/0.4ML IJ SOLN
200.0000 ug | INTRAMUSCULAR | Status: DC
  Administered 2013-12-27: 200 ug via SUBCUTANEOUS

## 2013-12-27 MED ORDER — DARBEPOETIN ALFA-POLYSORBATE 200 MCG/0.4ML IJ SOLN
INTRAMUSCULAR | Status: AC
  Administered 2013-12-27: 200 ug via SUBCUTANEOUS
  Filled 2013-12-27: qty 0.4

## 2013-12-28 ENCOUNTER — Telehealth: Payer: Self-pay | Admitting: *Deleted

## 2013-12-28 ENCOUNTER — Encounter: Payer: Self-pay | Admitting: *Deleted

## 2013-12-28 NOTE — Telephone Encounter (Signed)
pt notified about cxr results with verbal understanding 

## 2014-01-10 ENCOUNTER — Encounter: Payer: Self-pay | Admitting: Pulmonary Disease

## 2014-01-10 ENCOUNTER — Encounter (HOSPITAL_COMMUNITY)
Admission: RE | Admit: 2014-01-10 | Discharge: 2014-01-10 | Disposition: A | Discharge status: Home / Self Care | Payer: Medicare Other | Source: Ambulatory Visit | Attending: Family Medicine | Admitting: Family Medicine

## 2014-01-10 ENCOUNTER — Ambulatory Visit (INDEPENDENT_AMBULATORY_CARE_PROVIDER_SITE_OTHER): Payer: Medicare Other | Admitting: Pulmonary Disease

## 2014-01-10 VITALS — BP 126/74 | HR 56 | Temp 97.8°F | Ht 64.0 in | Wt 234.8 lb

## 2014-01-10 DIAGNOSIS — R5381 Other malaise: Secondary | ICD-10-CM

## 2014-01-10 DIAGNOSIS — I509 Heart failure, unspecified: Secondary | ICD-10-CM

## 2014-01-10 DIAGNOSIS — J9610 Chronic respiratory failure, unspecified whether with hypoxia or hypercapnia: Secondary | ICD-10-CM

## 2014-01-10 DIAGNOSIS — I5032 Chronic diastolic (congestive) heart failure: Secondary | ICD-10-CM

## 2014-01-10 DIAGNOSIS — J449 Chronic obstructive pulmonary disease, unspecified: Secondary | ICD-10-CM

## 2014-01-10 DIAGNOSIS — K7460 Unspecified cirrhosis of liver: Secondary | ICD-10-CM

## 2014-01-10 DIAGNOSIS — K754 Autoimmune hepatitis: Secondary | ICD-10-CM

## 2014-01-10 DIAGNOSIS — D61818 Other pancytopenia: Secondary | ICD-10-CM

## 2014-01-10 DIAGNOSIS — K5520 Angiodysplasia of colon without hemorrhage: Secondary | ICD-10-CM

## 2014-01-10 DIAGNOSIS — I2510 Atherosclerotic heart disease of native coronary artery without angina pectoris: Secondary | ICD-10-CM

## 2014-01-10 DIAGNOSIS — I10 Essential (primary) hypertension: Secondary | ICD-10-CM

## 2014-01-10 DIAGNOSIS — I359 Nonrheumatic aortic valve disorder, unspecified: Secondary | ICD-10-CM

## 2014-01-10 LAB — POCT HEMOGLOBIN-HEMACUE: Hemoglobin: 8.3 g/dL — ABNORMAL LOW (ref 12.0–15.0)

## 2014-01-10 MED ORDER — DARBEPOETIN ALFA-POLYSORBATE 200 MCG/0.4ML IJ SOLN
200.0000 ug | INTRAMUSCULAR | Status: DC
  Administered 2014-01-10: 200 ug via SUBCUTANEOUS

## 2014-01-10 MED ORDER — DARBEPOETIN ALFA-POLYSORBATE 200 MCG/0.4ML IJ SOLN
INTRAMUSCULAR | Status: AC
  Filled 2014-01-10: qty 0.4

## 2014-01-10 NOTE — Patient Instructions (Signed)
Today we updated your med list in our EPIC system...    Continue your current medications the same...  Increase the Mucinex 600mg  tabs to 2 tabs twice daily w/ plenty of water...  We discussed trying to increase your exercise program- start w/ chair exercises, then progress to walking & stairs as we discussed...  Continue your oxygen, Advair, Nebulizer, etc...  Call for any questions or if we can be of service in any way...  Let's plan a follow up visit in 4 months, sooner if needed for problems.Marland KitchenMarland Kitchen

## 2014-01-10 NOTE — Progress Notes (Signed)
Subjective:    Patient ID: [name], female    DOB: 1940-10-28, 73 y.o.   MRN: [id number]  HPI 73 y/o WF here for a follow up visit... she has mult med problems as noted below>   ~  September 01, 2012:  15moROV & [name] is c/o a URI "cold in my chest" w/ cough, discolored phlegm "diff colors at diff times", chest congestion, etc; she notes- tired all the time, HH is bothering her, BS at home betw 170-200 range... We reviewed the following medical problems during today's office visit>>     COPD> on Home O2, Advair250, NEBS, Mucinex; remote smoker quit yrs ago; known obstructive & restrictive lung dis; she had ZPak recently but persists w/ c/o discolored phlegm, no f/c/s, vague chest discomfort; last CXR 3/13 showed linear scarring left mid lung, otherw clear; CTA 3/13 was neg for PE & showed some atelec, coronary calcif, multilevel spondylosis, & cirrhosis of the liver; she declined f/u film today- we decided to treat w/ Levaquin, Mucinex, Fluids, Hycodan...    HBP> on Aten50, Lasix40, K20; BP= 118/82 & she has mult somatic complaints but no specific CP, palpit, ch in SOB or edema; continue low sodium & wt reduction.     CAD & mild AS/AI> known non-obstructive CAD; hx atypCP/ CWP; no angina & she remains way too sedentary; discussed risk factor reduction strategy & she saw DrCooper 7/13> stable, no angina, too sedentary; f/u 2DEcho w/ mild LVH, norm LVF w/ EF=55-60% & norm wall motion, mils AS & AI, Gr2DD...    Chol> on Simva40; not really on diet & we discussed this; last FLP 5/13 showed TChol 94, TG 130, HDL 32, LDL 36; continue same, needs exercise!    DM> on Lantus25, Metform1000Bid, Glimep2, Januvia100; needs much better diet & exercise program! Not checking BS at home; labs from 10/13 showed BS=164, A1c=5.0 so we decreased the Glimep from 490mto 110m31mach AM...    Obesity> way too sedentary, ?on diet & her wt is up to 209# today; we discussed diet, exercise, wt reduction strategies...    <<  Chronic GI problems followed by DrDBrodie >>    {GERD, Angiodysplasia w/ chronic GI blood loss >> see below... 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 We will continue to check labs every 110mo85moIron infusions of Hg is reduced & ROV in 48mo.38mo  DJD/ FM/ ?osteopenia/ VitD defic> she has mult somatic complaints & takes Tylenol prn; on VitD 50K weekly...    RLS> on Requip4Qhs & Sinemet25/100Bid from DrDohmeier w/ fair control of symptoms- her note is reviewed...    Anxiety/ Depression/ Psyche> on RX per Psychiatry DrHejazi (we don't have notes from him) & she does not know her meds, didn't bring med bottles or med list!  As best we can tell she is on Nuvigil, Pristiq, Wellbutrin, Risperdal, Seroquel, Xanax> all from Psychiatry & she is reminded to bring all bottles to every visit... We reviewed prob list, meds, xrays and labs> see below for updates >>  LABS 1/14:  CBC- Hg=7.0 (down 4pts) Hct=21.9 Fe=51 (11%sat) B12=623, Protime=11.6/ 1.1, AFP=pending; Abd sonar per DrDBrodie- pending...  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  DrDBrodie asked that we recheck her from the Gerty standpoint due to her SOB>>     COPD> on Home  O2, Advair250, NEBS, Mucinex; remote smoker quit yrs ago; known obstructive & restrictive lung dis; last CXR 2/14 showed cardiomeg, no pulm edema, clear lungs, NAD; CTA 3/13 was neg for PE & showed some atelec, coronary calcif, multilevel spondylosis, & cirrhosis of the liver; she saw TP 3/14 w/ incr cough, thick yellow sput, congestion & post nasal drip> took Avelox & Mucinex w/ some improvement;  We decided to evaluate further>>  PFT today showed FVC=2.41 (83%), FEV1=1.77 (80%), %1sec=74, mid-flows= 61% predicted (all c/w min obstructive dis, +superimposed restriction)...  Ambulatory O2 sats showed 93% sat on RA at rest w/ HR=60; 87% sat after one lap on RA w/ HR=62... Other factors include 12# weight gain, weight =221#, very sedentary lifestyle, anemia (she did not want me to recheck this as it is being followed by DrDBrodie), and severe anxiety...  She thinks a lot of her SOB is from CHF siting her wt gain etc but a recent BNP 3/14 was ok at 180, she has a follow up appt w/ DrCooper soon...  I told her that in my opinion the biggest current issue (and most improvable factor) is anxiety & "chest wall muscle spasm" making her tight & hard to get a good breath "IN" as she described it; I went through my long explanation for this problems & told her that the BEST therapy for this was a "combination relaxer" like KLONOPIN & rec that she try 63m- 1/2 to 1 tab bid... All questions answered & she is agreeable to this plan... ADDENDUM >> pt called back & indicated that after reading the Pharm handout that she decided NOT to take the Klonopin, doesn't want a substitute...  ~  February 25, 2013:  263moOV & post hosp check> since I last saw [name]- she has been Adm 3 times by [facility]>>   Adm 5/25-27 for weakness assoc w/ IDA & GIB> Hx cirrhosis, angiodysplasia, and had EGD showing sm varicies, portal gastropathy, mult AVMs w/ laser ablation; Hg was 7.7=> 2uPCs & improved to 9 range; subseq capsule endoscopy showed  AVMs in upper sm intestines as well... Adm 6/9-12 for N&V assoc w/ constip & UTI (sens EColi- treated w/ Cipro) and underlying cirrhosis, portal gastropathy, angiodysplasia, etc (treated w/ PPI & monitoring of Hg & Fe- given 2uPRBCs & Fe infusions as outpt)...  Adm 02/19/81-42r DiastolicCHF (CXR- vasc congestion; 2DEcho- EF=55-60%, Gr2DD, mildAS/AI; Lasix added)...    COPD> on Home O2, Advair250, NEBS, Mucinex; remote smoker quit yrs ago; known obstructive & restrictive lung dis; CXR 6/14 showed mild cardiomeg, mild vasc congestion, linear atx at left base, otherw clear; CTA 3/13 was neg for PE & showed some atelec, coronary calcif, multilevel spondylosis, & cirrhosis of  the liver; she continues on O2 by [place] at 3L/min...     HBP> on Aten50, Lisinopril5, Lasix20-3/d, K10Qod; BP= 130/70 & she has mult somatic complaints but no specific CP, palpit, ch in SOB or edema; continue low sodium & wt reduction.     CAD, DiastolicCHF, & mild AS/AI> known non-obstructive CAD; hx atypCP/ CWP; no angina & she remains way too sedentary; discussed risk factor reduction strategy & she saw DrCooper 5/14> stable, no angina, too sedentary; last 2DEcho 7/13 w/ mild LVH, norm LVF w/ EF=55-60% & norm wall motion, mild AS&AI, Gr2DD;  EKG 5/14 w/ NSR, rate62, 1st degree AVB, NSSTTWA...    Chol> on Simva40; not really on diet & we discussed this; last FLP 5/13 showed TChol 94, TG 130, HDL 32, LDL 36; continue same, needs exercise!    DM> on Lantus20u & off all DM pills per DrEllison; needs much better diet & exercise program! Not checking BS at home; labs from 6/14 showed BS=169, A1c=6.7 & she will continue f/u w/ Endocrinology...    Obesity> way too sedentary, ?on diet & her wt is 218# today; we discussed diet, exercise, wt reduction strategies...    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    DJD/ FM/ ?osteopenia/ VitD defic> she has mult somatic complaints & takes Tylenol prn; on MVI + VitD 50K weekly...    RLS> on Requip4Qhs & Sinemet25/100Bid from DrDohmeier w/ fair control of symptoms- her note is reviewed...    Anxiety/ Depression/ PsycheHx> on RX per Psychiatry DrHejazi (we don't have notes from him) & she does not know her meds, didn't bring med bottles or med list!  As best we can tell she is on Nuvigil (on hold), Pristiq100, WellbutrinXL150, Seroquel100-4Qhs, Xanax56m prn- all from Psychiatry & she is reminded to bring all bottles to every visit... We reviewed prob list, meds, xrays and labs> see below for updates >>  LABS 7/14:  CBC- Hg=8.8, MCV=93, WBC=3.2, Plat=74K;  Fe=58 (14%sat), Ferritin=12.5..Marland KitchenMarland KitchenNOTE>> Fe is dropping & we decided to infuse FERAHEME [id number] as outpt... ROV 57m9mo~  March 29, 2013:  57mo43mo & Mercades received her IV Feraheme 510mg48msince our last visit; still c/o no energy & SOB; she went to the ER 1wk ago w/ UTI- given Cipro but cult returned EColi resist Cipro & switched to Keflex (sens);  Due for f/u labs today>> We reviewed prob list, meds, xrays and labs> see below for updates >>  CXR 7/14 showed norm heart size, clear lungs, no edema etc... LABS 8/14:  Chems- ok x BS=257 Cr=1.4;  CBC- Hg=8.8 Fe=138 (35%sat)...  Rec>> needs f/u appt w/ DrEllison for endocrine & plan f/u labs monthly...  ~  May 31, 2013:  65mo R53mo since I last saw MarionGeorgeanas been to the ER twice, Hosp once, seen by Cards x3, GI x2, and DrEllison x2> complex story & a lot to get caught up on...    [facility] 05/[facility]/13 by [facility] w/ incr SOB & DOE, found to have Hg=7.7 w/  pos stools, had some nausea but no abd pain; given one unit, given IV Iron; &  she had EGD by DrPyrtle- no varices, smHH, mild gastropathy, area of GAVE treated w/ APC, nodular mucosa (inflammed mucosa, neg HPylori, no dysplasia); Rec rx w/ PPI Bid & Carafate Qid, Aten changed to Corgard for better reduction in portal pressure...  CARDS>  BP stable at 128/68; she had f/u Cards- DrCooper 9/14> nonobstructive CAD, HBP, mildAS, diastolicCHF, HL, morbid obesity; Lasix had been added & adjusted, she is NYHA III, otherw no changes... DM>  Unfortunately her wt is up 14# to 229# today; followed by DrEllison & seen 9/14> DM, neuropathy, CAD; A1c interpret influenced by transfusions; they increased her Lantus to 200u daily...  GI>  Followed by DrDBrodie & DrPyrtle- EGD as above, she had EGD w/ APC for her GAVE... ANEMIA> she received Tx 1u and IV Iron; Hg stabilized ~9...  ~  July 01, 2013:  33moROV but in the interval M[place]arshahas had one ER visit (c/o SOB but had "a myriad of complaints"; no acute abn on her w/u w/ CXR, EKG, Labs; disch on same meds)... She saw DrEllison for DM f/u- her sugars ranged 90-400 & A1c=8.0; he changed her insulin dosing to an evening shot... She saw DrDBrodie for GI> hx advanced liver dis from autoimmune hepatitis, ?steatohepatitis, portal hypertension & gastropathy w/ GI blood loss, ?GAVE w/ prev ablation of angodysplasia; they followed up on her lab work & plan to check monthly CBC, Fe... She saw Urology, DrGrapey 10/14 & they noted some mild voiding symptoms and hx recurrent cystitis w/ "a completely pos ROS"; they rec treating UTI only id symptomatic w/ a urinary pathogen...     She is c/o rash and itching> exam does not reveal a signif skin rash, recall hx of "delusions of pedunculosis" several yrs ago, saw mult clinicians & [facility] med [facility]; she has topicort to use prn, she is reassured... She is also c/o increased cough, congestion & yellow sput x2wks, says she fell & hit right side w/ some right rib  soreness; Recently treated w/ Levaquin but worried that she has pneumonia (CXR today is clear, ?atx in RML, and rib films w/ ?crack in right 7th rib?)...  We reviewed prob list, meds, xrays and labs> extensive med list reviewed w/ pt but she did not bring her med bottles...  ~  October 13, 2013:  357moOV & Alvaretta has been followed by DrDBrodie & was re-hosp 2/8 - 10/04/13 by [facility] w/ symptomatic anemia, iron deficiency, & her mult chronic medical issues; she was transfused & given IV iron and she has had f/u OV by GI- DrDBrodie who has ordered f/u blood counts and iron levels under her direction; [facility]... [facility] "we talk about my problems, she does blood work, and she communicates w/ DrBrodie"...  Pt has also had f/u visits w/ [facility], and DrEllison for DM/ Endocrine...     COPD> on Home O2, Advair250, NEBS, Mucinex; remote smoker quit yrs ago; known obstructive & restrictive lung dis; CXR 2/15 showed mild cardiomeg, mild vasc congestion, left mid lung scarring & peribronch thickening, otherw clear/NAD; CTA 3/13 was neg for PE & showed some atelec, coronary calcif, multilevel spondylosis, & cirrhosis of the liver; she continues on O2 by [place] at 3L/min...Marland KitchenMarland Kitchen  HBP> on Corgard20-1/2, Lisinopril5, Lasix20-2Bid, K10Qod; BP= 122/68 & she has mult somatic complaints but no specific CP, palpit, ch in SOB or edema; continue low sodium & wt reduction.     CAD, DiastolicCHF, & mild AS/AI> known non-obstructive CAD; hx atypCP/ CWP; no angina & she remains way too sedentary; discussed risk factor reduction strategy & she saw ScottWeaver 12/14> fragile status, chr dyspnea, no angina, too sedentary; last 2DEcho 7/13 w/ mild LVH, norm LVF w/ EF=55-60% & norm wall motion, mild AS&AI, Gr2DD;  EKG 12/14 w/ SBrady, rate47, 1st degree AVB, NSSTTWA; Corgard decr from 20=>51m/d...    Chol> on Simva40; not  really on diet & we discussed this; last FLP 5/13 showed TChol 94, TG 130, HDL 32, LDL 36; continue same, needs exercise, & ret for FLP f/u...    DM> on Lantus&Humalog per DrEllison; needs much better diet & exercise program! Not checking BS at home; labs from [phone] EFort Depositshows BS=130-260 and last A1c was 1/15= 6.5    Obesity> way too sedentary, ?on diet & her wt is 230# today; we discussed diet, exercise, wt reduction strategies...    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    DJD/ FM/ ?osteopenia/ VitD defic> she has mult somatic complaints & takes Tylenol prn; on MVI + VitD 50K weekly...    RLS> on Requip4Qhs & off Sinemet from DrDohmeier w/ fair control of symptoms- prev neuro note is reviewed...    Anxiety/ Depression/ PsycheHx> on RX per Psychiatry DrHejazi (we don't have notes from him) & she does not know her meds, didn't bring med bottles or med list!  As best we can tell she is on Pristiq100, WellbutrinXL150, Seroquel100-4Qhs, Xanax138mprn- all from Psychiatry & she is reminded to bring all bottles to every visit... We reviewed prob list, meds, xrays and labs> see below for updates >> I will continue to eval & treat her COPD, chr resp issues as I transition out of my primary care roll...   ~  Jan 10, 2014:  62m60moV & Becky is now [facility] to [facility] DrCooper for Cards, DrDBrodie for GI> their notes are reviewed...    Here for a 62mo35mo Pulmonary follow up- Combined obstructive & restrictive lung disease> on Home O2, Advair250, NEBS, Mucinex; remote smoker quit yrs  ago; known obstructive & restrictive lung dis by PFTs; CXR 2/15 showed mild cardiomeg, mild vasc congestion, left mid lung scarring & peribronch thickening, otherw clear/NAD; CTA 3/13 was neg for PE & showed some atelec, coronary calcif, multilevel spondylosis, & cirrhosis of the liver; she continues on O2 by [place] at 3L/min...     Breathing has been stable and chest sounds good;  She has not been taking the Mucinex regularly 7 is advised 2Bid w/ fluids;  She desperately needs to lose weight but is unable to exercise she says, noted DOE w/ ADLs but has not been exercising- rec to do chair exercises, the advance to walking, stairs in a progressive fashion...    She continues to be monitored regularly by  PrimaryCare w/ CBCs, periodic transfusions, regular Procrit injections, etc...            Problem List:  COPD (ICD-496) & BRONCHITIS, RECURRENT (ICD-491.9) - Ex-smoker w/ underlying COPD on home O2, ADVAIR 250Bid, ALBUTEROL (via nebs or MDI for Prn use), & MUCINEX 1-2Bid... she has combined obstructive AND restrictive disease (based on her obesity) w/ diet (weight reduction) + exercise strongly recommended to the pt...  ~  CTChest 5/06 = neg. ~  baseline CXR w/ bilat LL scarring, NAD.... last CXR 3/09 = unchanged, NAD. ~  PFTs 3/09 showed FVC= 2.47 (83%), FEV1= 1.65 (69%), %1sec= 67, mid-flows= 28%pred. ~  CXR 4/12 showed bilat scarring, NAD... ~  PFT 10/12 showed FVC= 2.28 (77%), FEV1= 1.46 (64%), %1sec= 64, mid-flows= 31% pred. ~  CXR 3/13 showed normal heart size, prom hilar regions, no infiltrates etc, NAD.[name]. ~  CTAngio Chest 3/13 showed neg for PE- scarring at lung bases, no adenopathy or lung lesions, calcified coronaries, multilevel spondylosis in TSpine, cirrhosis in liver ~  CXR 2/14 showed cardiomeg, no pulm edema, clear lungs, NAD ~  PFT 5/14 showed FVC=2.41 (83%), FEV1=1.77 (80%), %1sec=74, mid-flows= 61% predicted (all c/w min obstructive dis, +superimposed restriction) ~  Ambulatory O2  sats 5/14 showed 93% sat on RA at rest w/ HR=60; 87% sat after one lap on RA w/ HR=62 ~  7/14: on Home O2, Advair250, NEBS, Mucinex; remote smoker quit yrs ago; known obstructive & restrictive lung dis; CXR 6/14 showed mild cardiomeg, mild vasc congestion, linear atx at left base, otherw clear; CTA 3/13 was neg for PE & showed some atelec, coronary calcif, multilevel spondylosis, & cirrhosis of the liver; she continues on O2 by [place] at 3L/min...  ~  CXR 7/14 & 9/14 showed norm heart size, clear lungs, no edema etc..  ~  2/15: on Home O2, Advair250, NEBS, Mucinex; remote smoker quit yrs ago; known obstructive & restrictive lung dis; CXR 2/15 showed mild cardiomeg, mild vasc congestion, left mid lung scarring & peribronch thickening, otherw clear/NAD; CTA 3/13 was neg for PE & showed some atelec, coronary calcif, multilevel spondylosis, & cirrhosis of the liver; she continues on O2 by [place] at 3L/min...  ~  CXR 4/15 & 5/15 by Cards queried a nodular appearance to right hilum but f/u film showed this was wnl; scarring & atx at bases, healing right 7th rib fx...  HYPERTENSION (ICD-401.9) >> on ATENOLOL 50mg /d,  LASIX 40mg /d & K20/d...   CAD (ICD-414.00) >> on Corgard20-1/2, Lisinopril5, Lasix20-2Bid, K10Qod ~  cath 4/09 by DrCooper showed normLMAIN, 20% midLAD, norm CIRC, 20-30% proxRCA, EF= 50-55%... ~  CTAngio Chest 3/13 showed calcif coronaries as noted... ~  DrCooper 7/13> f/u 2DEcho w/ mild LVH, norm LVF w/ EF=55-60% & norm wall motion, mils AS & AI, Gr2DD.Marland KitchenMarland Kitchen  PALPITATIONS (DGU-440.3) >>  DIASTOLIC CHF >>  ~  eval by DrKlein w/ 7 beats WCT on monitor... he changed Lisinopril to BBlocker (currently ATENOLOL 50mg /d) and improved... ~  she saw DrCopper for Cards f/u 1/11- palpit well controlled on Atenolol; hx non-obstructive CAD & good LVF; no change in Rx. ~  [facility] 4/74 w/ Diastolic CHF and Lasix adjusted- Disch on Lasix20-3/d  AORTIC STENOSIS (ICD-424.1) - she has mild Ao Valve disease w/ AS/ AI...  followed by DrCooper. ~  2DEcho 11/06 showed mild ca++ AoV w/ mild AS, norm LVF w/ EF=55-65% ~  repeat 2DEcho 1/09 showed similar mild ca++ AoV w/ mild AS, mild AI, no regional wall motion abn, norm LVf  w/ EF= 60%... NOTE: est PAsys= 35... ~  repeat 2DEcho 1/10 showed norm LVF w/ EF= 55-60%, no regional wall motion abn, mild DD, mod calcif AoV & mild reduced leaflet excursion & mild AI... ~  Repeat 2DEcho 7/13 showed norm LVF w/ EF=55-65%, no regional wall motion abn, Gr2DD, mildAS, mildAI, mild LAdil... ~  DrCooper 7/13> stable, no angina, too sedentary; f/u 2DEcho w/ mild LVH, norm LVF w/ EF=55-60% & norm wall motion, mils AS & AI, Gr2DD... ~  7/14: known non-obstructive CAD; hx atypCP/ CWP; no angina & she remains way too sedentary; discussed risk factor reduction strategy & she saw DrCooper 5/14> stable, no angina, too sedentary; last 2DEcho 7/13 w/ mild LVH, norm LVF w/ EF=55-60% & norm wall motion, mild AS&AI, Gr2DD;  EKG 5/14 w/ NSR, rate62, 1st degree AVB, NSSTTWA...  HYPERCHOLESTEROLEMIA (ICD-272.0) - on SIMVASTATIN 93m/d...  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  ~  EGD  2/06 showed angiodysplasia & mult gastric pseudopolyps, watermelon stomach improved from 2003. ~  recent f/u EGD 9/09 showed chr gastritis, no watermelon stomach & improved from prev... ~  recurrent anemia 12/10 w/ f/u EGD 2/11 showing gastric antral avm's- s/p argon laser ablation, no varicies etc... ~  2011: she's been followed closely by GI DrDBrodie for her Autoimmune liver dis w/ Cirrhosis & splenomegaly, chr GI blood loss due to portal hypertensive gastropathy, angiodysplasia, & colon polyps> off her prev Immuran Rx due to  nausea. ~  5/13:  She is not on PPI and c/o some abd discomfort- rec starting PROTONIX 412md... ~  She saw DrDBrodie 10/13 w/ c/o dysphagia + f/u of her autoimmune liver dis, portal HTN, cirrhosis, "watermelon stomach" & hx argon laser ablation of gastric AVMs (chr GI blood loss w/ periodic Fe infusions- last 8/13); EGD 10/13 showed norm esoph mucosa, no varicies, spasm at the LES but no stricture found; dilated to 78F; mod severe portal hypertensive gastropathy in stomach, no active bleeding; REC> continue PPI, try Levsin... ~  1/14:  She had EGD & Argon Laser ablation of gastric AVM by DrDBrodie... 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 ~  9/14:  She was Hosp by [facility] & seen by GI- drPyrtle w/ EGD (no varices, smHH, mild gastropathy, area of GAVE treated w/ APC, nodular mucosa (inflammed mucosa, neg HPylori, no dysplasia)...  DIVERTICULOSIS OF COLON (ICD-562.10) & POLYP, COLON (ICD-211.3) -  ~  last colonoscopy 10/08 w/ adenomatous polyp removed, and angiodysplasia without hemorrhage ~  CT Abd 10/08 showed Cirrhosis, borderline splenomeg, sl pancreatic atrophy & duct dil, sm umbil hernia, DJD sp, NAD... ~  10/13: she had f/u drDBrodie> last colonoscopy was 2008, she felt pt was too high risk for prep & sedation for a f/u colonoscopy & they decided to hold off...  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 ~  LFTs 11/09 = WNL ~  LFTs 3/10 = WNL ~  LFTs 12/10 = WNL ~  LFTs 10/11 showed SGOT=39 (0-37), SGPT=35 (0-35) ~  LFTs 4/12 showed SGOT=39, SGPT=16 ~  LFTs 10/12 = WNL ~  LFTs 5/13 showed AlkPhos=118, SGOT=46, SGPT=44 ~  LFTs 6/14 showed AlkPhos= 151, SGOT=60, SGPT=41  Hx of CYSTITIS, RECURRENT (ICD-595.9) - hx mult UTI's w/ eval by  DrGrapey for Urology... ~  10/11:  presents w/ dysuria & UTI Rx w/ Cipro... ~  12/11:  She had f/u DrGrapey... ~  6/14: she was treated for a sens EColi UTI during 6/14 Hosp w/ Cipro...  OSTEOARTHRITIS (ICD-715.90)  FIBROMYALGIA (ICD-729.1) - she persists w/ mult somatic complaints...  ? of OSTEOPOROSIS (ICD-733.00) - she had normal BMD in 2001 at [place]..  RESTLESS LEGS SYNDROME (ICD-333.94) - she prev weaned off the Requip & remains on SINEMET-CR 25-100Bid... ~  9/10: now c/o recurrent restless leg symptoms... restart REQUIP 36m & titrate up. ~  12/10: persistant symptoms on 225mhs... rec to incr to 48m31m. ~  2/11: she reports resolution of RLS on 48mg92mquip Qhs, resting well now. ~  12/11:  Recurrent RLS symptoms despite the Requip & seen by DrDohmeier w/ addition of Sinemet 25/100 Bid... ~  2012:  Stable on Requip48mg 11minemet 25/100 Bid... ~  7/13:  She is once again c/o incr RLS symptoms & rec to restart REQUIP 48mg/d71m  ~  8/13: she had f/u visit w/ DrDohmeier> RLS, tremors, & insomnia; Seroquel helped for awhile, she is working w/ DrHejaImmunologistlp her problems... ~  7/14: on Requip4Qhs & Sinemet25/100Bid from DrDohmeier (RLS, Trmeor, Gait abn) w/ fair control of symptoms- her note is reviewed & they stopped the Sinemet...  DYSTHYMIA (ICD-300.4) - Hx depression w/ psychotic features and [facility]> she was prev treated by Dr. Amy SiViann [place] for counselling...  Adm to [facility] in May09 by DrBarbSmith w/ depression and meds adjusted (see DC summary)...  Now [facility]Jobe [facility] and meds changed to RISPERDAL, PRISTIQUE, BUPROPION, SEROQUEL, XANAX & Nuvigil (Armodafinil)...  husb indicates that she is much better at present & continues outpt Rx... ~  MRI/ MRA Brain 2/08 showed sm vessel dis & atherosclerotic changes in post circ... ~  9/10: pt  indicates that she is off the Trazodone... DrHejazi added Wellbutrin ?for her RLS? ~  2011:  pt continues regular f/u w/ Psychiatry on numerous meds w/ freq med adjustments; she is [facility] & bring all med bottles to her OVs for review... ~  2012:  We have not received any notes from [facility]; pt has not complied w/ our request for med bottles to be brought to each OV... ~  2013:  We still don't have accurate list of her current meds as we have not received [facility] notes & she NEVER brings meds to office despite our requests... ~  7/14: on RX per Psychiatry DrHejazi (we don't have notes from him) & she does not know her meds, didn't bring med bottles or med list!  As best we can tell she is on Nuvigil (on hold), Pristiq100, WellbutrinXL150, Seroquel100-4Qhs, Xanax1mg pr32mall from Psychiatry & she is reminded to bring all bottles to every visit...   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    Past Surgical History  Procedure Laterality Date  . Hemorroidectomy    .  Appendectomy    . Vaginal hysterectomy    . Breast biopsy      bilateral  . Cesarean section      x 3  . Appendectomy    . Esophagogastroduodenoscopy  06/12/2012    Procedure: ESOPHAGOGASTRODUODENOSCOPY (EGD);  Surgeon: Lafayette Dragon, MD;  Location: [facility];  Service: Endoscopy;  Laterality: N/A;  . Balloon dilation  06/12/2012    Procedure: BALLOON DILATION;  Surgeon: Lafayette Dragon, MD;  Location: [facility];  Service: Endoscopy;  Laterality: N/A;  ?balloon  . Esophagogastroduodenoscopy  09/10/2012    Procedure: ESOPHAGOGASTRODUODENOSCOPY (EGD);  Surgeon: Lafayette Dragon, MD;  Location: [facility];  Service: Endoscopy;  Laterality: N/A;  . Hot hemostasis  09/10/2012    Procedure: HOT  HEMOSTASIS (ARGON PLASMA COAGULATION/BICAP);  Surgeon: Lafayette Dragon, MD;  Location: [facility];  Service: Endoscopy;  Laterality: N/A;  . Esophagogastroduodenoscopy N/A 01/18/2013    Procedure: ESOPHAGOGASTRODUODENOSCOPY (EGD);  Surgeon: Lafayette Dragon, MD;  Location: [facility]OSCOPY;  Service: Endoscopy;  Laterality: N/A;  . Esophagogastroduodenoscopy N/A 05/24/2013    Procedure: ESOPHAGOGASTRODUODENOSCOPY (EGD);  Surgeon: Jerene Bears, MD;  Location: [place];  Service: Gastroenterology;  Laterality: N/A;  . Hot hemostasis N/A 05/24/2013    Procedure: HOT HEMOSTASIS (ARGON PLASMA COAGULATION/BICAP);  Surgeon: Jerene Bears, MD;  Location: [place];  Service: Gastroenterology;  Laterality: N/A;    Outpatient Encounter Prescriptions as of 01/10/2014  Medication Sig  . albuterol (PROAIR HFA) 108 (90 BASE) MCG/ACT inhaler Inhale 2 puffs into the lungs every 6 (six) hours as needed for wheezing or shortness of breath.   . ALPRAZolam (XANAX) 1 MG tablet Take 1 mg by mouth 3 (three) times daily as needed for anxiety.   . AMBULATORY NON FORMULARY MEDICATION Continuous O2 @@ 3LMP  . b complex vitamins tablet Take 1 tablet by mouth daily.   . B-D ULTRAFINE III SHORT PEN 31G X 8 MM MISC USE AS DIRECTED  . Blood Glucose Monitoring Suppl (ONE TOUCH ULTRA MINI) W/DEVICE KIT Test blood sugar up to three times daily  . buPROPion (WELLBUTRIN XL) 150 MG 24 hr tablet Take 150 mg by mouth daily.  [name] desvenlafaxine (PRISTIQ) 100 MG 24 hr tablet Take 100 mg by mouth daily. Takes with a $Remo'50mg'bJxvC$  tablet, total daily dose is $Remov'150mg'tUoBXP$   . desvenlafaxine (PRISTIQ) 50 MG 24 hr tablet Take 50 mg by mouth daily. Takes with a $Remo'100mg'tXvXp$  tablet, total daily dose is $Remov'150mg'TqlCcZ$   . dextromethorphan (DELSYM) 30 MG/5ML liquid Take 60 mg by mouth at bedtime.   . Fluticasone-Salmeterol (ADVAIR DISKUS) 250-50 MCG/DOSE AEPB INHALE 1 PUFF INTO THE LUNGS 2 (TWO) TIMES DAILY.  . furosemide (LASIX) 40 MG tablet Take 1 1/2 tablets (60 mg) in the morning  and 1 tablet in the afternoon  . glucose blood (ONE TOUCH TEST STRIPS) test strip Test blood sugar up to three times daily  . Insulin Glargine (LANTUS SOLOSTAR [place]) Inject 140 Units into the skin daily before supper.   . insulin lispro (HUMALOG) 100 UNIT/ML KiwkPen Inject 20 Units into the skin daily with supper.  . lactulose (CHRONULAC) 10 GM/15ML solution TAKE 30 MLS BY MOUTH DAILY  . Lancets (ONETOUCH ULTRASOFT) lancets Test blood sugar up to three times daily  . levalbuterol (XOPENEX) 0.63 MG/3ML nebulizer solution Take 0.63 mg by nebulization every 4 (four) hours as needed for wheezing or shortness of breath.  . lidocaine (LIDODERM) 5 % Place 1 patch onto the skin daily as needed (apply to right  shoulder or right back rib area for pain). Remove & Discard patch within 12 hours or as directed by MD  . lisinopril (PRINIVIL,ZESTRIL) 5 MG tablet Take 5 mg by mouth at bedtime.   . Multiple Vitamins-Minerals (CENTRUM SILVER PO) Take by mouth daily.  . nadolol (CORGARD) 20 MG tablet TAKE 1/2  TABLET BY MOUTH EVERY DAY  . pantoprazole (PROTONIX) 40 MG tablet Take 1 tablet (40 mg total) by mouth 2 (two) times daily.  . polyethylene glycol (MIRALAX / GLYCOLAX) packet Take 17 g by mouth daily as needed (constipation).  . promethazine (PHENERGAN) 25 MG tablet Take 1 tablet (25 mg total) by mouth 2 (two) times daily as needed for nausea.  [name] QUEtiapine (SEROQUEL) 400 MG tablet Take 400 mg by mouth at bedtime.  . rifaximin (XIFAXAN) 550 MG TABS tablet Take 1 tablet (550 mg total) by mouth daily.  [name] rOPINIRole (REQUIP) 4 MG tablet Take 4 mg by mouth at bedtime.  . Saline (OCEAN NASAL SPRAY NA) Place 1 spray into the nose daily as needed (congestion).  . Simethicone (GAS-X PO) Take 1 tablet by mouth daily as needed (flatulence).   . simvastatin (ZOCOR) 40 MG tablet TAKE 1 TABLET BY MOUTH AT BEDTIME FOR CHOLESTEROL  . spironolactone (ALDACTONE) 12.5 mg TABS tablet Take 0.5 tablets (12.5 mg total) by mouth  daily.  . Vitamin D, Ergocalciferol, (DRISDOL) 50000 UNITS CAPS capsule Take 50,000 Units by mouth every 7 (seven) days. On fridays  . [DISCONTINUED] nadolol (CORGARD) 20 MG tablet TAKE 1 TABLET BY MOUTH EVERY DAY    Allergies  Allergen Reactions  . Aspirin Other (See Comments)    Causes nosebleeds  . Penicillins Itching  . Theophylline Nausea And Vomiting    Current Medications, Allergies, Past Medical History, Past Surgical History, Family History, and Social History were reviewed in [employer] record.    Review of Systems         See HPI - all other systems neg except as noted...      The patient complains of weight gain, dyspnea on exertion, muscle weakness, difficulty walking, and depression.  The patient denies anorexia, fever, weight loss, vision loss, decreased hearing, hoarseness, chest pain, syncope, peripheral edema, prolonged cough, headaches, hemoptysis, abdominal pain, melena, hematochezia, severe indigestion/heartburn, hematuria, incontinence, suspicious skin lesions, transient blindness, unusual weight change, abnormal bleeding, enlarged lymph nodes, and angioedema.     Objective:   Physical Exam     WD, Obese, 73 y/o WF in NAD... she is chr ill appearing... GENERAL:  Alert & oriented x 3... HEENT:  [place]/AT, pale conjuct, EOM-wnl, PERRLA, EACs-clear, TMs-wnl, NOSE-clear, THROAT-clear w/ dry MMs... NECK:  Supple w/ fairROM; no JVD; normal carotid impulses w/o bruits; no thyromegaly or nodules palpated; no lymphadenopathy. CHEST:  Clear to P & A; without wheezes/ rales/ or rhonchi heard... HEART:  Regular Rhythm;  gr 1/6 SEM without rubs or gallops... ABDOMEN:  Obese, soft & nontender w/ panniculus; normal bowel sounds; no organomegaly or masses detected. EXT: without deformities, mod arthritic changes; no varicose veins/ +venous insuffic/ tr edema. NEURO:  CN's intact;  no focal neuro deficits... DERM:  No lesions noted; no rash, pale  complexion.  RADIOLOGY DATA:  Reviewed in the EPIC EMR & discussed w/ the patient...  LABORATORY DATA:  Reviewed in the EPIC EMR & discussed w/ the patient...   Assessment & Plan:    DYSPNEA> clearly this is multifactorial w/ components from Lung dis, CHF, Obesity, inactivity, Anemia, etc; a large  component is likely related to anxiety & chest "tightness" for which she has Xanax 3m from her psychiatrists...  COPD>  Combined mild obstructive & mod restrictive lung dis; PFT showed sl deterioration in FEV1; multifactorial dyspnea including stress; REC> continue Advair, NEBS, O2, Mucinex, etc; get on diet/ exercise/ get weight down; continue [facility] f/u for help w/ anxiety...  HBP>  Controlled on low dose BBlocker & ACE, plus diuretic> continue same & work on weight reduction...  CAD, DiastolicCHF>  Known nonobstructive dis, way too sedentary> we discussed diet + exercise...  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  CHOL>  Stable on the Simva40...  DM>  Managed by DrEllison on Lantus insulin currently...  OBESITY>  Reviewed diet + exercise needed...  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  Autoimmune hepatitis>  Also managed by DrBrodie, off the Immuran at present... She also has pancytopenia related to her cirrhosis.  RLS>  Notes from DrDohmeier reviewed> on Sinemet + needs to restart Requip...  Dysthymia>  followed by [facility] on mult meds...  Anemia>  Now [facility] and DrDBrodie for GI...   Patient's Medications  New Prescriptions   No medications on file  Previous Medications   ALBUTEROL (PROAIR HFA) 108 (90 BASE) MCG/ACT INHALER    Inhale 2 puffs into the lungs every 6 (six) hours as needed for wheezing or shortness of breath.    ALPRAZOLAM (XANAX) 1 MG TABLET    Take 1 mg by mouth 3 (three) times daily as needed for anxiety.     AMBULATORY NON FORMULARY MEDICATION    Continuous O2 @@ 3LMP   B COMPLEX VITAMINS TABLET    Take 1 tablet by mouth daily.    B-D ULTRAFINE III SHORT PEN 31G X 8 MM MISC    USE AS DIRECTED   BLOOD GLUCOSE MONITORING SUPPL (ONE TOUCH ULTRA MINI) W/DEVICE KIT    Test blood sugar up to three times daily   BUPROPION (WELLBUTRIN XL) 150 MG 24 HR TABLET    Take 150 mg by mouth daily.   DESVENLAFAXINE (PRISTIQ) 100 MG 24 HR TABLET    Take 100 mg by mouth daily. Takes with a 55mtablet, total daily dose is 15081m DESVENLAFAXINE (PRISTIQ) 50 MG 24 HR TABLET    Take 50 mg by mouth daily. Takes with a 100m1mblet, total daily dose is 150mg42mEXTROMETHORPHAN (DELSYM) 30 MG/5ML LIQUID    Take 60 mg by mouth at bedtime.    FLUTICASONE-SALMETEROL (ADVAIR DISKUS) 250-50 MCG/DOSE AEPB    INHALE 1 PUFF INTO THE LUNGS 2 (TWO) TIMES DAILY.   FUROSEMIDE (LASIX) 40 MG TABLET    Take 1 1/2 tablets (60 mg) in the morning and 1 tablet in the afternoon   GLUCOSE BLOOD (ONE TOUCH TEST STRIPS) TEST STRIP    Test blood sugar up to three times daily   GUAIFENESIN (MUCINEX) 600 MG 12 HR TABLET    Take 1,200 mg by mouth 2 (two) times daily.   INSULIN GLARGINE (LANTUS SOLOSTAR [place])    Inject 140 Units into the skin daily before supper.    INSULIN LISPRO (HUMALOG) 100 UNIT/ML KIWKPEN    Inject 20 Units into the skin daily with supper.   LACTULOSE (CHRONULAC) 10 GM/15ML SOLUTION    TAKE 30 MLS BY MOUTH DAILY   LANCETS (ONETOUCH ULTRASOFT) LANCETS  Test blood sugar up to three times daily   LEVALBUTEROL (XOPENEX) 0.63 MG/3ML NEBULIZER SOLUTION    Take 0.63 mg by nebulization every 4 (four) hours as needed for wheezing or shortness of breath.   LIDOCAINE (LIDODERM) 5 %    Place 1 patch onto the skin daily as needed (apply to right shoulder or right back rib area for pain). Remove & Discard patch within 12 hours or as directed by MD   LISINOPRIL (PRINIVIL,ZESTRIL) 5 MG TABLET    Take 5 mg by mouth at bedtime.    MULTIPLE  VITAMINS-MINERALS (CENTRUM SILVER PO)    Take by mouth daily.   PANTOPRAZOLE (PROTONIX) 40 MG TABLET    Take 1 tablet (40 mg total) by mouth 2 (two) times daily.   POLYETHYLENE GLYCOL (MIRALAX / GLYCOLAX) PACKET    Take 17 g by mouth daily as needed (constipation).   PROMETHAZINE (PHENERGAN) 25 MG TABLET    Take 1 tablet (25 mg total) by mouth 2 (two) times daily as needed for nausea.   QUETIAPINE (SEROQUEL) 400 MG TABLET    Take 400 mg by mouth at bedtime.   RIFAXIMIN (XIFAXAN) 550 MG TABS TABLET    Take 1 tablet (550 mg total) by mouth daily.   ROPINIROLE (REQUIP) 4 MG TABLET    Take 4 mg by mouth at bedtime.   SALINE (OCEAN NASAL SPRAY NA)    Place 1 spray into the nose daily as needed (congestion).   SIMETHICONE (GAS-X PO)    Take 1 tablet by mouth daily as needed (flatulence).    SIMVASTATIN (ZOCOR) 40 MG TABLET    TAKE 1 TABLET BY MOUTH AT BEDTIME FOR CHOLESTEROL   SPIRONOLACTONE (ALDACTONE) 12.5 MG TABS TABLET    Take 0.5 tablets (12.5 mg total) by mouth daily.   VITAMIN D, ERGOCALCIFEROL, (DRISDOL) 50000 UNITS CAPS CAPSULE    Take 50,000 Units by mouth every 7 (seven) days. On fridays  Modified Medications   Modified Medication Previous Medication   NADOLOL (CORGARD) 20 MG TABLET nadolol (CORGARD) 20 MG tablet      TAKE 1/2  TABLET BY MOUTH EVERY DAY    TAKE 1 TABLET BY MOUTH EVERY DAY  Discontinued Medications   No medications on file

## 2014-01-11 ENCOUNTER — Encounter: Payer: Self-pay | Admitting: *Deleted

## 2014-01-17 ENCOUNTER — Other Ambulatory Visit: Payer: Self-pay | Admitting: Pulmonary Disease

## 2014-01-18 ENCOUNTER — Encounter: Payer: Self-pay | Admitting: Family Medicine

## 2014-01-18 ENCOUNTER — Ambulatory Visit (INDEPENDENT_AMBULATORY_CARE_PROVIDER_SITE_OTHER): Payer: Medicare Other | Admitting: Family Medicine

## 2014-01-18 VITALS — BP 120/80 | HR 54 | Temp 98.7°F | Wt 235.0 lb

## 2014-01-18 DIAGNOSIS — I5032 Chronic diastolic (congestive) heart failure: Secondary | ICD-10-CM

## 2014-01-18 DIAGNOSIS — N289 Disorder of kidney and ureter, unspecified: Secondary | ICD-10-CM

## 2014-01-18 DIAGNOSIS — D500 Iron deficiency anemia secondary to blood loss (chronic): Secondary | ICD-10-CM

## 2014-01-18 DIAGNOSIS — I509 Heart failure, unspecified: Secondary | ICD-10-CM

## 2014-01-18 NOTE — Assessment & Plan Note (Signed)
Persistent - continue to monitor. Did not check Cr today.

## 2014-01-18 NOTE — Assessment & Plan Note (Signed)
Ongoing losses. Continue procrit Q2 wks, recheck Hgb next week.

## 2014-01-18 NOTE — Progress Notes (Signed)
Pre visit review using our clinic review tool, if applicable. No additional management support is needed unless otherwise documented below in the visit note. 

## 2014-01-18 NOTE — Progress Notes (Signed)
BP 120/80  Pulse 54  Temp(Src) 98.7 F (37.1 C) (Tympanic)  Wt 235 lb (106.595 kg)  SpO2 98%   CC: 1 mo f/u  Subjective:    Patient ID: [name], female    DOB: 1940-11-08, 73 y.o.   MRN: [id number]  HPI: [name] is a 73 y.o. 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  Latest Hgb 8.3. Overall stable. Denies change in dyspnea, denies chest pain. Short nosebleed yesterday morning.  Feeling some nausea. Decreased exercise capacity.  No falls in the last month.  She continues to see Arbie Cookey Spanx at home.   This week noticing decreased urination. Drinking water and staying hydrated.  Lab Results  Component Value Date   CREATININE 1.5* 12/20/2013   Wt Readings from Last 3 Encounters:  01/18/14 235 lb (106.595 kg)  01/10/14 234 lb 12.8 oz (106.505 kg)  12/21/13 230 lb (104.327 kg)  Body mass index is 40.32 kg/(m^2).   Relevant past medical, surgical, family and social history reviewed and updated as indicated.  Allergies and medications reviewed and updated. Current Outpatient Prescriptions on File Prior to Visit  Medication Sig  . albuterol (PROAIR HFA) 108 (90 BASE) MCG/ACT inhaler Inhale 2 puffs into the lungs every 6 (six) hours as needed for wheezing or shortness of breath.   . ALPRAZolam (XANAX) 1 MG tablet Take 1 mg by mouth 3 (three) times daily as needed for anxiety.   . AMBULATORY NON FORMULARY MEDICATION Continuous O2 @@ 3LMP  . b complex vitamins tablet Take 1 tablet by mouth daily.   . B-D ULTRAFINE III SHORT PEN 31G X 8 MM MISC USE AS DIRECTED  . buPROPion (WELLBUTRIN XL) 150 MG 24 hr tablet Take 150 mg by mouth daily.  [name] desvenlafaxine  (PRISTIQ) 100 MG 24 hr tablet Take 100 mg by mouth daily. Takes with a 50mg  tablet, total daily dose is 150mg   . desvenlafaxine (PRISTIQ) 50 MG 24 hr tablet Take 50 mg by mouth daily. Takes with a 100mg  tablet, total daily dose is 150mg   . dextromethorphan (DELSYM) 30 MG/5ML liquid Take 60 mg by mouth at bedtime.   . Fluticasone-Salmeterol (ADVAIR DISKUS) 250-50 MCG/DOSE AEPB INHALE 1 PUFF INTO THE LUNGS 2 (TWO) TIMES DAILY.  . furosemide (LASIX) 40 MG tablet Take 1 1/2 tablets (60 mg) in the morning and 1 tablet in the afternoon  . glucose blood (ONE TOUCH TEST STRIPS) test strip Test blood sugar up to three times daily  . guaiFENesin (MUCINEX) 600 MG 12 hr tablet Take 1,200 mg by mouth 2 (two) times daily.  . Insulin Glargine (LANTUS SOLOSTAR [place]) Inject 140 Units into the skin daily before supper.   . insulin lispro (HUMALOG) 100 UNIT/ML KiwkPen Inject 20 Units into the skin daily with supper.  . lactulose (CHRONULAC) 10 GM/15ML solution TAKE 30 MLS BY MOUTH DAILY  . Lancets (ONETOUCH ULTRASOFT) lancets Test blood sugar up to three times daily  . levalbuterol (XOPENEX) 0.63 MG/3ML nebulizer solution Take 0.63 mg by nebulization every 4 (four) hours as needed for wheezing or shortness of breath.  . lidocaine (LIDODERM) 5 % Place 1 patch onto the skin daily as needed (apply to right shoulder or right back rib  area for pain). Remove & Discard patch within 12 hours or as directed by MD  . lisinopril (PRINIVIL,ZESTRIL) 5 MG tablet Take 5 mg by mouth at bedtime.   . Multiple Vitamins-Minerals (CENTRUM SILVER PO) Take by mouth daily.  . nadolol (CORGARD) 20 MG tablet TAKE 1/2  TABLET BY MOUTH EVERY DAY  . pantoprazole (PROTONIX) 40 MG tablet Take 1 tablet (40 mg total) by mouth 2 (two) times daily.  . polyethylene glycol (MIRALAX / GLYCOLAX) packet Take 17 g by mouth daily as needed (constipation).  . promethazine (PHENERGAN) 25 MG tablet Take 1 tablet (25 mg total) by mouth 2 (two) times daily as  needed for nausea.  [name] QUEtiapine (SEROQUEL) 400 MG tablet Take 400 mg by mouth at bedtime.  . rifaximin (XIFAXAN) 550 MG TABS tablet Take 1 tablet (550 mg total) by mouth daily.  [name] rOPINIRole (REQUIP) 4 MG tablet Take 4 mg by mouth at bedtime.  . Saline (OCEAN NASAL SPRAY NA) Place 1 spray into the nose daily as needed (congestion).  . Simethicone (GAS-X PO) Take 1 tablet by mouth daily as needed (flatulence).   . simvastatin (ZOCOR) 40 MG tablet TAKE 1 TABLET BY MOUTH AT BEDTIME FOR CHOLESTEROL  . spironolactone (ALDACTONE) 12.5 mg TABS tablet Take 0.5 tablets (12.5 mg total) by mouth daily.  . Vitamin D, Ergocalciferol, (DRISDOL) 50000 UNITS CAPS capsule TAKE 1 CAPSULE (50,000 UNITS TOTAL) BY MOUTH EVERY 7 (SEVEN) DAYS. TAKE ON FRIDAY   No current facility-administered medications on file prior to visit.    Review of Systems Per HPI unless specifically indicated above    Objective:    BP 120/80  Pulse 54  Temp(Src) 98.7 F (37.1 C) (Tympanic)  Wt 235 lb (106.595 kg)  SpO2 98%  Physical Exam  Nursing note and vitals reviewed. Constitutional: She appears well-developed and well-nourished. No distress.  HENT:  Mouth/Throat: Oropharynx is clear and moist. No oropharyngeal exudate.  Cardiovascular: Normal rate, regular rhythm, normal heart sounds and intact distal pulses.   No murmur heard. Pulmonary/Chest: Effort normal and breath sounds normal. No respiratory distress. She has no wheezes. She has no rales.  Musculoskeletal: Edema: minimal tr pedal edema.  Skin: Skin is warm and dry. No rash noted.  Psychiatric: She has a normal mood and affect.   Results for orders placed during the hospital encounter of 01/10/14  POCT HEMOGLOBIN-HEMACUE      Result Value Ref Range   Hemoglobin 8.3 (*) 12.0 - 15.0 g/dL      Assessment & Plan:   Problem List Items Addressed This Visit   Renal insufficiency - Primary     Persistent - continue to monitor. Did not check Cr today.    IRON  DEFICIENCY ANEMIA SECONDARY TO BLOOD LOSS (Chronic)     Ongoing losses. Continue procrit Q2 wks, recheck Hgb next week.    Chronic diastolic CHF (congestive heart failure) (Chronic)     Seems euvolemic today. No changes.        Follow up plan: Return in about 3 months (around 04/20/2014), or as needed, for follow up visit.

## 2014-01-18 NOTE — Patient Instructions (Signed)
No changes today. Keep appointment with Dr Olevia Perches and Richardson Dopp. Good to see you today, call us with questions. Return in 2-3 months for follow up visit.

## 2014-01-18 NOTE — Assessment & Plan Note (Signed)
Seems euvolemic today. No changes.

## 2014-01-19 ENCOUNTER — Ambulatory Visit (INDEPENDENT_AMBULATORY_CARE_PROVIDER_SITE_OTHER): Payer: Medicare Other | Admitting: Physician Assistant

## 2014-01-19 ENCOUNTER — Telehealth: Payer: Self-pay | Admitting: *Deleted

## 2014-01-19 ENCOUNTER — Encounter: Payer: Self-pay | Admitting: Physician Assistant

## 2014-01-19 VITALS — BP 120/62 | HR 50 | Ht 64.0 in | Wt 234.0 lb

## 2014-01-19 DIAGNOSIS — I10 Essential (primary) hypertension: Secondary | ICD-10-CM

## 2014-01-19 DIAGNOSIS — D509 Iron deficiency anemia, unspecified: Secondary | ICD-10-CM

## 2014-01-19 DIAGNOSIS — R0602 Shortness of breath: Secondary | ICD-10-CM

## 2014-01-19 DIAGNOSIS — K31819 Angiodysplasia of stomach and duodenum without bleeding: Secondary | ICD-10-CM

## 2014-01-19 DIAGNOSIS — J449 Chronic obstructive pulmonary disease, unspecified: Secondary | ICD-10-CM

## 2014-01-19 DIAGNOSIS — I2510 Atherosclerotic heart disease of native coronary artery without angina pectoris: Secondary | ICD-10-CM

## 2014-01-19 DIAGNOSIS — I359 Nonrheumatic aortic valve disorder, unspecified: Secondary | ICD-10-CM

## 2014-01-19 DIAGNOSIS — R079 Chest pain, unspecified: Secondary | ICD-10-CM

## 2014-01-19 DIAGNOSIS — I509 Heart failure, unspecified: Secondary | ICD-10-CM

## 2014-01-19 DIAGNOSIS — I5032 Chronic diastolic (congestive) heart failure: Secondary | ICD-10-CM

## 2014-01-19 LAB — BASIC METABOLIC PANEL
BUN: 59 mg/dL — AB (ref 6–23)
CO2: 29 mEq/L (ref 19–32)
Calcium: 9 mg/dL (ref 8.4–10.5)
Chloride: 101 mEq/L (ref 96–112)
Creatinine, Ser: 1.9 mg/dL — ABNORMAL HIGH (ref 0.4–1.2)
GFR: 27.74 mL/min — ABNORMAL LOW (ref >60.00)
Glucose, Bld: 176 mg/dL — ABNORMAL HIGH (ref 70–99)
Potassium: 4.5 mEq/L (ref 3.5–5.1)
Sodium: 139 mEq/L (ref 135–145)

## 2014-01-19 LAB — BRAIN NATRIURETIC PEPTIDE: PRO B NATRI PEPTIDE: 85 pg/mL (ref 0.0–100.0)

## 2014-01-19 NOTE — Patient Instructions (Signed)
INCREASE LASIX FOR 3 DAYS TO 80 MG TWICE DAILY AFTER THE 3 DAYS GO BACK TO YOUR REGULAR DOSE OF LASIX  LAB WORK TODAY, BMET, BNP  REPEAT LAB WORK IN 1 WEEK (BMET)  Your physician recommends that you schedule a follow-up appointment in: [place], PAC SAME DAY DR. Burt Knack IS IN THE OFFICE

## 2014-01-19 NOTE — Progress Notes (Signed)
Cardiology Office Note    Date:  01/19/2014   ID:  [name], DOB May 24, 1941, MRN [id number]  PCP:  Ria Bush, MD  Cardiologist:  Dr. Sherren Mocha     History of Present Illness: [name] is a 73 y.o. 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     Admitted for a/c diastolic CHF 11/1960 and then seen in the ED in 04/2013 with volume excess.    Admitted 04/2013 with worsening anemia and transfused with PRBCs. EGD demonstrated GAVE which was treated with APC.    Last seen by Dr. Sherren Mocha 11/23/13.  She was volume overloaded.  Lasix was adjusted and she was placed on Spironolactone.   I saw her in f/u 12/21/13.  She had been transfused with PRBCs the week prior.   She thought she saw hemoptysis.  I did a CXR which was unremarkable.  She returns for close f/u.  She notes stable dyspnea. She has had more chest discomfort. This is left-sided around her breast. It is a dull pain. It is not related to exertion. She denies any radiating symptoms were associated nausea or diaphoresis. She denies syncope. She sleeps on 3 pillows chronically. She denies PND. She does note increased abdominal girth. Her weight is increased from her last visit. She denies chest pain associated with meals. She denies any change in her chronic cough.   Studies:  - LHC 11/2007: Dx 20%, proximal and mid RCA 20-30%, EF 50-55%.    - Echo 01/2013: EF 55-60%, Gr 2 DD, very mild AS (mean 9 mmHg), mild AI, mild BAE, PASP 39.   CXR (12/27/13): IMPRESSION: 1. Normal appearance of the hila. 2. Healing right seventh rib fracture. Adjacent peripheral lung opacity is unchanged may represent atelectasis.   Recent Labs: 02/19/2013: TSH 1.541  10/03/2013: Pro B Natriuretic peptide (BNP) 207.5*  12/07/2013: ALT 33  12/20/2013: Creatinine 1.5*; Potassium  4.7  01/10/2014: Hemoglobin 8.3*    Wt Readings from Last 3 Encounters:  01/18/14 235 lb (106.595 kg)  01/10/14 234 lb 12.8 oz (106.505 kg)  12/21/13 230 lb (104.327 kg)     Past Medical History  Diagnosis Date  . Chronic airway obstruction, not elsewhere classified   . Unspecified chronic bronchitis   . Unspecified essential hypertension   . Coronary atherosclerosis of unspecified type of vessel, native or graft   . Palpitations   . Aortic valve disorders   . Pure hypercholesterolemia   . Morbid obesity   . Esophageal reflux   . Angiodysplasia of intestine (without mention of hemorrhage)   . Diverticulosis of colon (without mention of hemorrhage)   . Benign neoplasm of colon   . Autoimmune hepatitis   . Cystitis, unspecified   . Osteoarthrosis, unspecified whether generalized or localized, unspecified site   . Osteoporosis, unspecified   . Restless legs syndrome (RLS)   . Major depressive disorder, recurrent episode, severe, specified as with psychotic behavior   . Iron deficiency anemia secondary to blood loss (chronic)   . Coarse tremors     "just in my arms"  . Carpal tunnel syndrome on both sides   . Dysrhythmia     palpitations  . CHF (congestive heart failure)   . Asthma   . Shortness of breath     "any time really"  . Sleep apnea     "  but I don't use the equipment"  . Type II diabetes mellitus   . Blood transfusion   . H/O hiatal hernia   . 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 Adenomatous colon polyp   . Anginal pain   . Anxiety   . Pneumonia   . Heart murmur   . AVM (arteriovenous malformation)   . Portal hypertensive gastropathy   . Falls frequently   . UTI (lower urinary tract infection)   . Hiatal hernia   . GAVE (gastric antral vascular ectasia)     Current Outpatient Prescriptions  Medication Sig Dispense Refill  . albuterol (PROAIR HFA) 108 (90 BASE) MCG/ACT inhaler Inhale 2 puffs into the lungs every 6 (six) hours as needed for wheezing  or shortness of breath.       . ALPRAZolam (XANAX) 1 MG tablet Take 1 mg by mouth 3 (three) times daily as needed for anxiety.       . AMBULATORY NON FORMULARY MEDICATION Continuous O2 @@ 3LMP      . b complex vitamins tablet Take 1 tablet by mouth daily.       . B-D ULTRAFINE III SHORT PEN 31G X 8 MM MISC USE AS DIRECTED  100 each  3  . buPROPion (WELLBUTRIN XL) 150 MG 24 hr tablet Take 150 mg by mouth daily.      [name] desvenlafaxine (PRISTIQ) 100 MG 24 hr tablet Take 100 mg by mouth daily. Takes with a 50mg  tablet, total daily dose is 150mg       . desvenlafaxine (PRISTIQ) 50 MG 24 hr tablet Take 50 mg by mouth daily. Takes with a 100mg  tablet, total daily dose is 150mg       . dextromethorphan (DELSYM) 30 MG/5ML liquid Take 60 mg by mouth at bedtime.       . Fluticasone-Salmeterol (ADVAIR DISKUS) 250-50 MCG/DOSE AEPB INHALE 1 PUFF INTO THE LUNGS 2 (TWO) TIMES DAILY.  14 each  0  . furosemide (LASIX) 40 MG tablet Take 1 1/2 tablets (60 mg) in the morning and 1 tablet in the afternoon  30 tablet    . glucose blood (ONE TOUCH TEST STRIPS) test strip Test blood sugar up to three times daily  100 each  12  . guaiFENesin (MUCINEX) 600 MG 12 hr tablet Take 1,200 mg by mouth 2 (two) times daily.      . Insulin Glargine (LANTUS SOLOSTAR [place]) Inject 140 Units into the skin daily before supper.       . insulin lispro (HUMALOG) 100 UNIT/ML KiwkPen Inject 20 Units into the skin daily with supper.      . lactulose (CHRONULAC) 10 GM/15ML solution TAKE 30 MLS BY MOUTH DAILY  960 mL  0  . Lancets (ONETOUCH ULTRASOFT) lancets Test blood sugar up to three times daily  100 each  12  . levalbuterol (XOPENEX) 0.63 MG/3ML nebulizer solution Take 0.63 mg by nebulization every 4 (four) hours as needed for wheezing or shortness of breath.      . lidocaine (LIDODERM) 5 % Place 1 patch onto the skin daily as needed (apply to right shoulder or right back rib area for pain). Remove & Discard patch within 12 hours or as directed by  MD      . lisinopril (PRINIVIL,ZESTRIL) 5 MG tablet Take 5 mg by mouth at bedtime.       . Multiple Vitamins-Minerals (CENTRUM SILVER PO) Take by mouth daily.      . nadolol (CORGARD) 20 MG  tablet TAKE 1/2  TABLET BY MOUTH EVERY DAY      . pantoprazole (PROTONIX) 40 MG tablet Take 1 tablet (40 mg total) by mouth 2 (two) times daily.  60 tablet  11  . polyethylene glycol (MIRALAX / GLYCOLAX) packet Take 17 g by mouth daily as needed (constipation).      . promethazine (PHENERGAN) 25 MG tablet Take 1 tablet (25 mg total) by mouth 2 (two) times daily as needed for nausea.  30 tablet  5  . QUEtiapine (SEROQUEL) 400 MG tablet Take 400 mg by mouth at bedtime.      . rifaximin (XIFAXAN) 550 MG TABS tablet Take 1 tablet (550 mg total) by mouth daily.  24 tablet  0  . rOPINIRole (REQUIP) 4 MG tablet Take 4 mg by mouth at bedtime.      . Saline (OCEAN NASAL SPRAY NA) Place 1 spray into the nose daily as needed (congestion).      . Simethicone (GAS-X PO) Take 1 tablet by mouth daily as needed (flatulence).       . simvastatin (ZOCOR) 40 MG tablet TAKE 1 TABLET BY MOUTH AT BEDTIME FOR CHOLESTEROL  30 tablet  6  . spironolactone (ALDACTONE) 12.5 mg TABS tablet Take 0.5 tablets (12.5 mg total) by mouth daily.  30 tablet  6  . Vitamin D, Ergocalciferol, (DRISDOL) 50000 UNITS CAPS capsule TAKE 1 CAPSULE (50,000 UNITS TOTAL) BY MOUTH EVERY 7 (SEVEN) DAYS. TAKE ON FRIDAY  5 capsule  0   No current facility-administered medications for this visit.    Allergies:    Allergies  Allergen Reactions  . Aspirin Other (See Comments)    Causes nosebleeds  . Penicillins Itching  . Theophylline Nausea And Vomiting    Social History:  The patient  reports that she quit smoking about 21 years ago. Her smoking use included Cigarettes. She has a 37.5 pack-year smoking history. She has never used smokeless tobacco. She reports that she does not drink alcohol or use illicit drugs.   ROS:  Please see the history of present  illness.    All other systems reviewed and negative.   PHYSICAL EXAM: VS:  BP 120/62  Pulse 50  Ht 5\' 4"  (1.626 m)  Wt 234 lb (106.142 kg)  BMI 40.15 kg/m2 Well nourished, well developed, in no acute distress HEENT: normal Neck: minimally elevated JVD at 90  Cardiac:  normal S1, S2; RRR; 8-2/5 systolic murmur at the RUSBno rub Lungs:  Decreased breath sounds bilaterally, no wheezing, rhonchi or rales Abd: distended, nontender, no hepatomegaly Ext: no edema Skin: warm and dry Neuro:  CNs 2-12 intact, no focal abnormalities noted  EKG:   Sinus bradycardia, HR 50, first degree AV block (PR 264), LAD, NSSTTW changes, no change from prior tracing   ASSESSMENT AND PLAN:  1. Chest Pain:  Atypical. She had minimal nonobstructive CAD at cardiac catheterization in 2009. She has had chest discomfort in the past for volume overload. Her weight is increased and I suspect volume excess explains her symptoms. I will increase her Lasix as noted below. If she continues to have chest discomfort, consider Myoview. 2. Dyspnea:  Her dyspnea is multifactorial. Check a basic metabolic panel and BNP today. 3. Chronic Diastolic CHF:  She has evidence of volume excess. I will increase her Lasix to 80 mg twice a day for 3 days. Check a basic metabolic panel today and again in 1 week. 4. Hypertension:  Controlled. 5. CAD:  As noted, she  has had some atypical chest pain. Therapeutic options are limited given her chronic GI bleeding. If she continues to have chest symptoms, consider Myoview. Continue statin. 6. Aortic Stenosis:  Very mild by recent echocardiogram. Continue clinical follow up. 7. COPD:  Continue followup with primary care. 8. Anemia:  From chronic GI blood loss.  She is now receiving Aranesp injections and has had several transfusions with PRBCs as noted above.  9. GAVE:  F/u with GI as planned.  10. Disposition:  F/u with me or Dr. Burt Knack in one month.   Signed, Richardson Dopp, PA-C  01/19/2014  11:03 AM

## 2014-01-19 NOTE — Telephone Encounter (Signed)
pt notified about lab results and has bmet 6/3. Pt advised to let us know how chest pain is after the 3 days of increased doseof lasix per Brynda Rim. PA. Pt verbalized Plan of Care

## 2014-01-24 ENCOUNTER — Telehealth: Payer: Self-pay

## 2014-01-24 ENCOUNTER — Encounter (HOSPITAL_COMMUNITY)
Admission: RE | Admit: 2014-01-24 | Discharge: 2014-01-24 | Disposition: A | Discharge status: Home / Self Care | Payer: Medicare Other | Source: Ambulatory Visit | Attending: Pulmonary Disease | Admitting: Pulmonary Disease

## 2014-01-24 ENCOUNTER — Other Ambulatory Visit: Payer: Self-pay | Admitting: Pulmonary Disease

## 2014-01-24 DIAGNOSIS — D509 Iron deficiency anemia, unspecified: Secondary | ICD-10-CM | POA: Insufficient documentation

## 2014-01-24 LAB — POCT HEMOGLOBIN-HEMACUE: Hemoglobin: 7.9 g/dL — ABNORMAL LOW (ref 12.0–15.0)

## 2014-01-24 MED ORDER — DARBEPOETIN ALFA-POLYSORBATE 200 MCG/0.4ML IJ SOLN
200.0000 ug | INTRAMUSCULAR | Status: DC
  Administered 2014-01-24: 200 ug via SUBCUTANEOUS

## 2014-01-24 MED ORDER — DARBEPOETIN ALFA-POLYSORBATE 200 MCG/0.4ML IJ SOLN
INTRAMUSCULAR | Status: AC
  Filled 2014-01-24: qty 0.4

## 2014-01-24 NOTE — Telephone Encounter (Signed)
Noted. hgb 7.9. Would suggest blood transfusion - 1 unit pRBC and afterwards lasix dose. plz schedule this week if able.

## 2014-01-24 NOTE — Telephone Encounter (Signed)
[place] [facility] left v/m; pt was given Aranesp shot,  pts hgb was 7.9;vital signs stable BP 137/78, T 98.6 P 54. Pt told nurse that she did not feel good today and thought hgb was going to be lower; may contact pt at [phone]. Unable to reach pt by phone.

## 2014-01-25 NOTE — Telephone Encounter (Signed)
Order in your IN box for signature. Noted 40mg  of Lasix after transfusion.

## 2014-01-25 NOTE — Telephone Encounter (Signed)
Order faxed to [facility].

## 2014-01-25 NOTE — Telephone Encounter (Signed)
Signed and returned to [place].

## 2014-01-25 NOTE — Telephone Encounter (Signed)
Pt left v/m requesting cb at [phone] to let pt know what she should do about low hgb.

## 2014-01-25 NOTE — Telephone Encounter (Signed)
Patient notified and will await call for scheduling. 

## 2014-01-26 ENCOUNTER — Other Ambulatory Visit (HOSPITAL_COMMUNITY): Payer: Self-pay | Admitting: *Deleted

## 2014-01-26 ENCOUNTER — Other Ambulatory Visit (INDEPENDENT_AMBULATORY_CARE_PROVIDER_SITE_OTHER): Payer: Medicare Other

## 2014-01-26 DIAGNOSIS — I509 Heart failure, unspecified: Secondary | ICD-10-CM

## 2014-01-26 DIAGNOSIS — I5032 Chronic diastolic (congestive) heart failure: Secondary | ICD-10-CM

## 2014-01-26 DIAGNOSIS — I10 Essential (primary) hypertension: Secondary | ICD-10-CM

## 2014-01-26 LAB — BASIC METABOLIC PANEL
BUN: 48 mg/dL — AB (ref 6–23)
CO2: 28 mEq/L (ref 19–32)
CREATININE: 1.6 mg/dL — AB (ref 0.4–1.2)
Calcium: 9.1 mg/dL (ref 8.4–10.5)
Chloride: 102 mEq/L (ref 96–112)
GFR: 32.87 mL/min — AB (ref >60.00)
GLUCOSE: 236 mg/dL — AB (ref 70–99)
Potassium: 4.4 mEq/L (ref 3.5–5.1)
Sodium: 138 mEq/L (ref 135–145)

## 2014-01-26 NOTE — Telephone Encounter (Signed)
Appt scheduled for 01/27/14 at 8:00 AM. Patient notified.

## 2014-01-27 ENCOUNTER — Encounter (HOSPITAL_COMMUNITY)
Admission: RE | Admit: 2014-01-27 | Discharge: 2014-01-27 | Disposition: A | Discharge status: Home / Self Care | Payer: Medicare Other | Source: Ambulatory Visit | Attending: Family Medicine | Admitting: Family Medicine

## 2014-01-27 ENCOUNTER — Telehealth: Payer: Self-pay | Admitting: *Deleted

## 2014-01-27 LAB — PREPARE RBC (CROSSMATCH)

## 2014-01-27 MED ORDER — SODIUM CHLORIDE 0.9 % IV SOLN: Freq: Once | INTRAVENOUS | Status: DC

## 2014-01-27 MED ORDER — FUROSEMIDE 40 MG PO TABS
40.0000 mg | ORAL_TABLET | Freq: Once | ORAL | Status: AC
  Administered 2014-01-27: 40 mg via ORAL
  Filled 2014-01-27: qty 1

## 2014-01-27 NOTE — Telephone Encounter (Signed)
pt notified about her lab results and that kidney function stable, K+ normal. Pt verbalized understanding

## 2014-01-28 LAB — TYPE AND SCREEN
ABO/RH(D): O POS
Antibody Screen: POSITIVE
DAT, IGG: NEGATIVE
Donor AG Type: NEGATIVE
Unit division: 0

## 2014-02-01 ENCOUNTER — Telehealth: Payer: Self-pay | Admitting: Pulmonary Disease

## 2014-02-01 MED ORDER — FLUTICASONE-SALMETEROL 250-50 MCG/DOSE IN AEPB: INHALATION_SPRAY | RESPIRATORY_TRACT | Status: DC

## 2014-02-01 NOTE — Telephone Encounter (Signed)
Spoke w/ spouse. Will leave samples for pick up. Nothing further needed

## 2014-02-01 NOTE — Addendum Note (Signed)
Addended by: Inge Rise on: 02/01/2014 09:23 AM   Modules accepted: Orders

## 2014-02-02 ENCOUNTER — Other Ambulatory Visit: Payer: Self-pay | Admitting: Pulmonary Disease

## 2014-02-07 ENCOUNTER — Encounter (HOSPITAL_COMMUNITY)
Admission: RE | Admit: 2014-02-07 | Discharge: 2014-02-07 | Disposition: A | Discharge status: Home / Self Care | Payer: Medicare Other | Source: Ambulatory Visit | Attending: Family Medicine | Admitting: Family Medicine

## 2014-02-07 LAB — POCT HEMOGLOBIN-HEMACUE: HEMOGLOBIN: 8.3 g/dL — AB (ref 12.0–15.0)

## 2014-02-07 MED ORDER — DARBEPOETIN ALFA-POLYSORBATE 200 MCG/0.4ML IJ SOLN
200.0000 ug | INTRAMUSCULAR | Status: DC
  Administered 2014-02-07: 200 ug via SUBCUTANEOUS

## 2014-02-07 MED ORDER — DARBEPOETIN ALFA-POLYSORBATE 200 MCG/0.4ML IJ SOLN
INTRAMUSCULAR | Status: AC
  Filled 2014-02-07: qty 0.4

## 2014-02-11 ENCOUNTER — Ambulatory Visit (INDEPENDENT_AMBULATORY_CARE_PROVIDER_SITE_OTHER): Payer: Medicare Other | Admitting: Internal Medicine

## 2014-02-11 ENCOUNTER — Other Ambulatory Visit (INDEPENDENT_AMBULATORY_CARE_PROVIDER_SITE_OTHER): Payer: Medicare Other

## 2014-02-11 ENCOUNTER — Encounter: Payer: Self-pay | Admitting: Internal Medicine

## 2014-02-11 VITALS — BP 132/78 | HR 60

## 2014-02-11 DIAGNOSIS — K766 Portal hypertension: Secondary | ICD-10-CM

## 2014-02-11 DIAGNOSIS — K7460 Unspecified cirrhosis of liver: Secondary | ICD-10-CM

## 2014-02-11 DIAGNOSIS — K31819 Angiodysplasia of stomach and duodenum without bleeding: Secondary | ICD-10-CM

## 2014-02-11 DIAGNOSIS — D500 Iron deficiency anemia secondary to blood loss (chronic): Secondary | ICD-10-CM

## 2014-02-11 LAB — IBC PANEL
IRON: 88 ug/dL (ref 42–145)
Saturation Ratios: 21 % (ref 20.0–50.0)
Transferrin: 299.2 mg/dL (ref 212.0–360.0)

## 2014-02-11 LAB — CBC WITH DIFFERENTIAL/PLATELET
Basophils Absolute: 0 10*3/uL (ref 0.0–0.1)
Basophils Relative: 0.2 % (ref 0.0–3.0)
EOS PCT: 4 % (ref 0.0–5.0)
Eosinophils Absolute: 0.1 10*3/uL (ref 0.0–0.7)
LYMPHS ABS: 0.2 10*3/uL — AB (ref 0.7–4.0)
Lymphocytes Relative: 6.4 % — ABNORMAL LOW (ref 12.0–46.0)
MCHC: 33.7 g/dL (ref 30.0–36.0)
MCV: 100.6 fl — ABNORMAL HIGH (ref 78.0–100.0)
MONOS PCT: 9.2 % (ref 3.0–12.0)
Monocytes Absolute: 0.3 10*3/uL (ref 0.1–1.0)
NEUTROS ABS: 2.8 10*3/uL (ref 1.4–7.7)
Neutrophils Relative %: 80.2 % — ABNORMAL HIGH (ref 43.0–77.0)
Platelets: 86 10*3/uL — ABNORMAL LOW (ref 150.0–400.0)
RBC: 2.49 Mil/uL — ABNORMAL LOW (ref 3.87–5.11)
RDW: 15.6 % — ABNORMAL HIGH (ref 11.5–15.5)
WBC: 3.4 10*3/uL — AB (ref 4.0–10.5)

## 2014-02-11 LAB — AMMONIA: AMMONIA: 115 umol/L — AB (ref 11–35)

## 2014-02-11 MED ORDER — LACTULOSE 10 GM/15ML PO SOLN: ORAL | Status: DC

## 2014-02-11 MED ORDER — RIFAXIMIN 550 MG PO TABS: 550.0000 mg | ORAL_TABLET | Freq: Every day | ORAL | Status: DC

## 2014-02-11 NOTE — Patient Instructions (Addendum)
Your physician has requested that you go to the basement for the following lab work before leaving today: CBC, IBC, Ammonia  We have sent the following medications to your pharmacy for you to pick up at your convenience: Xifaxan 550 mg once daily Lactulose 2 tablespoons (30 mg) once daily  Please follow up with Dr Olevia Perches in 3 months.

## 2014-02-11 NOTE — Progress Notes (Signed)
STEPHEN TURNBAUGH Aug 25, 1941 [id number]  Note: This dictation was prepared with Dragon digital system. Any transcriptional errors that result from this procedure are unintentional.   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 Her last blood transfusion was on 01/27/2014. Her last office visit was on 4/17/ 2015. She has a history of AVM ablation with argon laser.the most recently  in May 2014 and September 2014. She has oxygen-dependent COPD, congestive heart failure and renal insufficiency. Her creatinine is 1.5. She has had recent elevation of venous ammonia at 78. She has been on lactulose and on Xifaxan 550 mg daily. She is also on Lasix 60 mg in the a.m. and 40 mg in the p.m. She is also on aldactone 12.5 mg daily. Her complaint today is weakness.and  not feel well.    Past Medical History  Diagnosis Date  . Chronic airway obstruction, not elsewhere classified   . Unspecified chronic bronchitis   . Unspecified essential hypertension   . Coronary atherosclerosis of unspecified type of vessel, native or graft   . Palpitations   . Aortic valve disorders   . Pure hypercholesterolemia   . Morbid obesity   . Esophageal reflux   . Angiodysplasia of intestine (without mention of hemorrhage)   . Diverticulosis of colon (without mention of hemorrhage)   . Benign neoplasm of colon   . Autoimmune hepatitis   . Cystitis, unspecified   . Osteoarthrosis, unspecified whether generalized or localized, unspecified site   . Osteoporosis, unspecified   . Restless legs syndrome (RLS)   . Major depressive disorder, recurrent episode, severe, specified as with psychotic behavior   . Iron deficiency anemia secondary to blood loss (chronic)   . Coarse tremors     "just in my arms"  .  Carpal tunnel syndrome on both sides   . Dysrhythmia     palpitations  . CHF (congestive heart failure)   . Asthma   . Shortness of breath     "any time really"  . Sleep apnea     "but I don't use the equipment"  . Type II diabetes mellitus   . Blood transfusion   . H/O hiatal hernia   . 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 Adenomatous colon polyp   . Anginal pain   . Anxiety   . Pneumonia   . Heart murmur   . AVM (arteriovenous malformation)   . Portal hypertensive gastropathy   . Falls frequently   . UTI (lower urinary tract infection)   . Hiatal hernia   . GAVE (gastric antral vascular ectasia)     Past Surgical History  Procedure Laterality Date  . Hemorroidectomy    . Appendectomy    . Vaginal hysterectomy    . Breast biopsy      bilateral  . Cesarean section      x 3  . Appendectomy    . Esophagogastroduodenoscopy  06/12/2012    Procedure: ESOPHAGOGASTRODUODENOSCOPY (EGD);  Surgeon: Lafayette Dragon, MD;  Location: [facility];  Service: Endoscopy;  Laterality: N/A;  . Balloon dilation  06/12/2012    Procedure: BALLOON DILATION;  Surgeon: Lafayette Dragon, MD;  Location: [facility];  Service: Endoscopy;  Laterality: N/A;  ?balloon  . Esophagogastroduodenoscopy  09/10/2012    Procedure: ESOPHAGOGASTRODUODENOSCOPY (EGD);  Surgeon: Lafayette Dragon, MD;  Location: [facility];  Service: Endoscopy;  Laterality: N/A;  . Hot hemostasis  09/10/2012    Procedure: HOT HEMOSTASIS (ARGON PLASMA COAGULATION/BICAP);  Surgeon: Lafayette Dragon, MD;  Location: [facility];  Service: Endoscopy;  Laterality: N/A;  . Esophagogastroduodenoscopy N/A 01/18/2013    Procedure: ESOPHAGOGASTRODUODENOSCOPY (EGD);  Surgeon: Lafayette Dragon, MD;  Location[facility];  Service: Endoscopy;  Laterality: N/A;  . Esophagogastroduodenoscopy N/A 05/24/2013    Procedure: ESOPHAGOGASTRODUODENOSCOPY (EGD);  Surgeon: Jerene Bears, MD;  Location: [place];  Service: Gastroenterology;  Laterality: N/A;  .  Hot hemostasis N/A 05/24/2013    Procedure: HOT HEMOSTASIS (ARGON PLASMA COAGULATION/BICAP);  Surgeon: Jerene Bears, MD;  Location: [place];  Service: Gastroenterology;  Laterality: N/A;    Allergies  Allergen Reactions  . Aspirin Other (See Comments)    Causes nosebleeds  . Penicillins Itching  . Theophylline Nausea And Vomiting    Family history and social history have been reviewed.  Review of Systems: Diarrhea secondary to lactulose denies rectal bleeding  The remainder of the 10 point ROS is negative except as outlined in the H&P  Physical Exam: General Appearance Well developed, in no distress, obese Eyes  Non icteric  HEENT  Non traumatic, normocephalic  Mouth No lesion, tongue papillated, no cheilosis Neck Supple without adenopathy, thyroid not enlarged, no carotid bruits, no JVD Lungs Clear to auscultation bilaterally COR Normal S1, normal S2, regular rhythm, no murmur, quiet precordium Abdomen obese soft nontender with normoactive bowel sounds. No ascites Rectal not done Extremities  trace pedal edema Skin No lesions multiple ecchymoses and scratch marks Neurological Alert and oriented x 3, no asterixis Psychological Normal mood and affect  Assessment and Plan:   Problem #1 Advanced steatohepatitis with portal hypertension, stable since last visit. We will not change any of her medications today but will check her blood count and venous ammonia. We will also make sure she has a refill on Xifaxan and lactulose. We will see her in 3 months.    [name] 02/11/2014

## 2014-02-14 ENCOUNTER — Telehealth: Payer: Self-pay | Admitting: *Deleted

## 2014-02-14 ENCOUNTER — Other Ambulatory Visit: Payer: Self-pay | Admitting: *Deleted

## 2014-02-14 ENCOUNTER — Encounter: Payer: Self-pay | Admitting: Internal Medicine

## 2014-02-14 DIAGNOSIS — K7469 Other cirrhosis of liver: Secondary | ICD-10-CM

## 2014-02-14 NOTE — Telephone Encounter (Signed)
[facility] sent fax request for prior authorization on Xifaxan 550. I have contacted [facility] [phone]) to complete prior authorization request. Per Rica Mote, patient's Doreene Nest has been approved until 08/16/14. Authorization # [id number] (dx cirrhosis 571.5 autoimmune hepatitis 571.42)

## 2014-02-17 ENCOUNTER — Other Ambulatory Visit: Payer: Self-pay | Admitting: *Deleted

## 2014-02-17 DIAGNOSIS — K7460 Unspecified cirrhosis of liver: Secondary | ICD-10-CM

## 2014-02-17 DIAGNOSIS — R188 Other ascites: Principal | ICD-10-CM

## 2014-02-18 ENCOUNTER — Other Ambulatory Visit: Payer: Self-pay | Admitting: Pulmonary Disease

## 2014-02-19 ENCOUNTER — Other Ambulatory Visit: Payer: Self-pay | Admitting: Pulmonary Disease

## 2014-02-21 ENCOUNTER — Encounter (HOSPITAL_COMMUNITY)
Admission: RE | Admit: 2014-02-21 | Discharge: 2014-02-21 | Disposition: A | Discharge status: Home / Self Care | Payer: Medicare Other | Source: Ambulatory Visit | Attending: Family Medicine | Admitting: Family Medicine

## 2014-02-21 ENCOUNTER — Telehealth: Payer: Self-pay | Admitting: Internal Medicine

## 2014-02-21 ENCOUNTER — Telehealth: Payer: Self-pay | Admitting: *Deleted

## 2014-02-21 DIAGNOSIS — D649 Anemia, unspecified: Secondary | ICD-10-CM | POA: Diagnosis present

## 2014-02-21 DIAGNOSIS — K766 Portal hypertension: Secondary | ICD-10-CM | POA: Insufficient documentation

## 2014-02-21 DIAGNOSIS — K319 Disease of stomach and duodenum, unspecified: Secondary | ICD-10-CM | POA: Diagnosis not present

## 2014-02-21 DIAGNOSIS — N289 Disorder of kidney and ureter, unspecified: Secondary | ICD-10-CM | POA: Diagnosis not present

## 2014-02-21 DIAGNOSIS — K7689 Other specified diseases of liver: Secondary | ICD-10-CM | POA: Diagnosis not present

## 2014-02-21 DIAGNOSIS — Z8679 Personal history of other diseases of the circulatory system: Secondary | ICD-10-CM | POA: Insufficient documentation

## 2014-02-21 LAB — PREPARE RBC (CROSSMATCH)

## 2014-02-21 LAB — POCT HEMOGLOBIN-HEMACUE: Hemoglobin: 7.6 g/dL — ABNORMAL LOW (ref 12.0–15.0)

## 2014-02-21 MED ORDER — DARBEPOETIN ALFA-POLYSORBATE 200 MCG/0.4ML IJ SOLN
200.0000 ug | INTRAMUSCULAR | Status: DC
  Administered 2014-02-21: 200 ug via SUBCUTANEOUS

## 2014-02-21 MED ORDER — DARBEPOETIN ALFA-POLYSORBATE 200 MCG/0.4ML IJ SOLN
INTRAMUSCULAR | Status: AC
  Filled 2014-02-21: qty 0.4

## 2014-02-21 NOTE — Telephone Encounter (Signed)
Kristen notified and also Dr. Darnell Level added 40 mg of lasix after transfusion with no more on day of transfusion. Kristen verbalized understanding.

## 2014-02-21 NOTE — Telephone Encounter (Signed)
Patient is calling because she wants to be referred to a hematologist because of her blood counts. She would like to see Dr. Benay Spice or Dr. Jana Hakim. Please, advise.

## 2014-02-21 NOTE — Telephone Encounter (Signed)
It appears she received an injection of Aranesp from Dr Lenna Gilford. It would be appropriate for him to make the referral to Hematology. From GI standpoint she is having chronic GI bleed from avm's, coagulopathy, portal hypertension.

## 2014-02-21 NOTE — Telephone Encounter (Signed)
Kristen at [facility] called and patient's Hgb was 7.6. She has been feeling tired. [name] went ahead and drew a type and cross if you wanted to order a transfusion. Please advise. [phone] (ph)

## 2014-02-21 NOTE — Telephone Encounter (Signed)
Last transfusion 01/27/2014.  plz order 1 u pRBC transfusion.

## 2014-02-22 ENCOUNTER — Ambulatory Visit (INDEPENDENT_AMBULATORY_CARE_PROVIDER_SITE_OTHER): Payer: Medicare Other | Admitting: Physician Assistant

## 2014-02-22 ENCOUNTER — Encounter: Payer: Self-pay | Admitting: Physician Assistant

## 2014-02-22 VITALS — BP 110/59 | HR 50 | Ht 64.0 in | Wt 231.0 lb

## 2014-02-22 DIAGNOSIS — I509 Heart failure, unspecified: Secondary | ICD-10-CM

## 2014-02-22 DIAGNOSIS — I10 Essential (primary) hypertension: Secondary | ICD-10-CM

## 2014-02-22 DIAGNOSIS — J449 Chronic obstructive pulmonary disease, unspecified: Secondary | ICD-10-CM

## 2014-02-22 DIAGNOSIS — R079 Chest pain, unspecified: Secondary | ICD-10-CM

## 2014-02-22 DIAGNOSIS — I359 Nonrheumatic aortic valve disorder, unspecified: Secondary | ICD-10-CM

## 2014-02-22 DIAGNOSIS — D509 Iron deficiency anemia, unspecified: Secondary | ICD-10-CM

## 2014-02-22 DIAGNOSIS — I5032 Chronic diastolic (congestive) heart failure: Secondary | ICD-10-CM

## 2014-02-22 DIAGNOSIS — R0602 Shortness of breath: Secondary | ICD-10-CM

## 2014-02-22 DIAGNOSIS — I2510 Atherosclerotic heart disease of native coronary artery without angina pectoris: Secondary | ICD-10-CM

## 2014-02-22 NOTE — Telephone Encounter (Signed)
Spoke with patient and she will have her PCP make the referral.

## 2014-02-22 NOTE — Patient Instructions (Signed)
Your physician wants you to follow-up in: [place]. You will receive a reminder letter in the mail two months in advance. If you don't receive a letter, please call our office to schedule the follow-up appointment.   Your physician recommends that you continue on your current medications as directed. Please refer to the Current Medication list given to you today.

## 2014-02-22 NOTE — Progress Notes (Signed)
Cardiology Office Note    Date:  02/22/2014   ID:  [name], DOB [birth year], MRN [id number]  PCP:  [name] Bush, MD  Cardiologist:  Dr. Sherren Mocha     History of Present Illness: [name] is a 73 y.o. 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     Admitted for a/c diastolic CHF 01/3784 and then seen in the ED in 04/2013 with volume excess.    Admitted 04/2013 with worsening anemia and transfused with PRBCs. EGD demonstrated GAVE which was treated with APC.    Over the last few months, she has had worsening anemia requiring transfusion with PRBCs.  Lasix has been adjusted for volume excess.  I have followed her closely over the last few months.  I last saw her 5/27.  She had some chest pain I thought this was related to increased volume and adjusted her Lasix again.  She returns for follow up.  Of note, recent Hgb yesterday was lower at 7.6.  She is also being referred to hematology.    She continues to feel bad.  She notes significant dyspnea with any activity (NYHA 3-3b).  She sleeps on 3 pillows chronically.  She denies PND.  She denies edema.  She notes some increased abdominal girth.  She continues to have chest pain.  Diuresis made no difference.  She notes a chest heaviness with some radiation to her shoulders.  She often notes nausea.  She denies syncope.    Studies:  - LHC 11/2007: Dx 20%, proximal and mid RCA 20-30%, EF 50-55%.    - Echo 01/2013: EF 55-60%, Gr 2 DD, very mild AS (mean 9 mmHg), mild AI, mild BAE, PASP 39.   CXR (12/27/13): IMPRESSION: 1. Normal appearance of the hila. 2. Healing right seventh rib fracture. Adjacent peripheral lung opacity is unchanged may represent atelectasis.   Recent Labs: 12/07/2013: ALT 33  01/19/2014: Pro B Natriuretic peptide (BNP) 85.0  01/26/2014:  Creatinine 1.6*; Potassium 4.4  02/21/2014: Hemoglobin 7.6*    Wt Readings from Last 3 Encounters:  02/22/14 231 lb (104.781 kg)  01/27/14 234 lb (106.142 kg)  01/19/14 234 lb (106.142 kg)     Past Medical History  Diagnosis Date  . Chronic airway obstruction, not elsewhere classified   . Unspecified chronic bronchitis   . Unspecified essential hypertension   . Coronary atherosclerosis of unspecified type of vessel, native or graft   . Palpitations   . Aortic valve disorders   . Pure hypercholesterolemia   . Morbid obesity   . Esophageal reflux   . Angiodysplasia of intestine (without mention of hemorrhage)   . Diverticulosis of colon (without mention of hemorrhage)   . Benign neoplasm of colon   . Autoimmune hepatitis   . Cystitis, unspecified   . Osteoarthrosis, unspecified whether generalized or localized, unspecified site   . Osteoporosis, unspecified   . Restless legs syndrome (RLS)   . Major depressive disorder, recurrent episode, severe, specified as with psychotic behavior   . Iron deficiency anemia secondary to blood loss (chronic)   . Coarse tremors     "just in my arms"  . Carpal tunnel syndrome on both sides   . Dysrhythmia     palpitations  . CHF (congestive heart failure)   . Asthma   . Shortness of breath     "  any time really"  . Sleep apnea     "but I don't use the equipment"  . Type II diabetes mellitus   . Blood transfusion   . H/O hiatal hernia   . 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 Adenomatous colon polyp   . Anginal pain   . Anxiety   . Pneumonia   . Heart murmur   . AVM (arteriovenous malformation)   . Portal hypertensive gastropathy   . Falls frequently   . UTI (lower urinary tract infection)   . Hiatal hernia   . GAVE (gastric antral vascular ectasia)     Current Outpatient Prescriptions  Medication Sig Dispense Refill  . albuterol (PROAIR HFA) 108 (90 BASE) MCG/ACT inhaler Inhale 2 puffs into the lungs every 6 (six) hours as  needed for wheezing or shortness of breath.       . ALPRAZolam (XANAX) 1 MG tablet Take 1 mg by mouth 3 (three) times daily as needed for anxiety.       . AMBULATORY NON FORMULARY MEDICATION Continuous O2 @@ 3LMP      . b complex vitamins tablet Take 1 tablet by mouth daily.       . B-D ULTRAFINE III SHORT PEN 31G X 8 MM MISC USE AS DIRECTED  100 each  3  . buPROPion (WELLBUTRIN XL) 150 MG 24 hr tablet Take 150 mg by mouth daily.      [name] desvenlafaxine (PRISTIQ) 100 MG 24 hr tablet Take 100 mg by mouth daily. Takes with a 50mg  tablet, total daily dose is 150mg       . desvenlafaxine (PRISTIQ) 50 MG 24 hr tablet Take 50 mg by mouth daily. Takes with a 100mg  tablet, total daily dose is 150mg       . dextromethorphan (DELSYM) 30 MG/5ML liquid Take 60 mg by mouth at bedtime as needed.       . Fluticasone-Salmeterol (ADVAIR DISKUS) 250-50 MCG/DOSE AEPB INHALE 1 PUFF INTO THE LUNGS 2 (TWO) TIMES DAILY.  14 each  0  . furosemide (LASIX) 40 MG tablet Take 1 1/2 tablets (60 mg) in the morning and 1 tablet in the afternoon  30 tablet    . glucose blood (ONE TOUCH TEST STRIPS) test strip Test blood sugar up to three times daily  100 each  12  . guaiFENesin (MUCINEX) 600 MG 12 hr tablet Take 600 mg by mouth. 2 tablet by mouth once daily      . Insulin Glargine (LANTUS SOLOSTAR [place]) Inject 140 Units into the skin daily before supper.       . insulin lispro (HUMALOG) 100 UNIT/ML KiwkPen Inject 20 Units into the skin daily with supper.      . lactulose (CHRONULAC) 10 GM/15ML solution TAKE 30 MLS BY MOUTH DAILY  960 mL  1  . lactulose, encephalopathy, (CHRONULAC) 10 GM/15ML SOLN Take 10 g by mouth daily. constipation      . Lancets (ONETOUCH ULTRASOFT) lancets Test blood sugar up to three times daily  100 each  12  . levalbuterol (XOPENEX) 0.63 MG/3ML nebulizer solution Take 0.63 mg by nebulization every 4 (four) hours as needed for wheezing or shortness of breath.      . lidocaine (LIDODERM) 5 % Place 1 patch onto  the skin daily as needed (apply to right shoulder or right back rib area for pain). Remove & Discard patch within 12 hours or as directed by MD      . lisinopril (PRINIVIL,ZESTRIL) 5 MG  tablet Take 5 mg by mouth at bedtime.       . Multiple Vitamins-Minerals (CENTRUM SILVER PO) Take by mouth daily.      . nadolol (CORGARD) 20 MG tablet TAKE 1/2  TABLET BY MOUTH EVERY DAY      . oxyCODONE (OXY IR/ROXICODONE) 5 MG immediate release tablet Take 5 mg by mouth every 4 (four) hours as needed. prn      . pantoprazole (PROTONIX) 40 MG tablet Take 40 mg by mouth daily.       . polyethylene glycol (MIRALAX / GLYCOLAX) packet Take 17 g by mouth daily as needed (constipation).      . promethazine (PHENERGAN) 25 MG tablet Take 1 tablet (25 mg total) by mouth 2 (two) times daily as needed for nausea.  30 tablet  5  . QUEtiapine (SEROQUEL) 400 MG tablet Take 400 mg by mouth at bedtime.      . rifaximin (XIFAXAN) 550 MG TABS tablet Take 1 tablet (550 mg total) by mouth daily.  30 tablet  2  . rOPINIRole (REQUIP) 4 MG tablet Take 4 mg by mouth at bedtime.      . Saline (OCEAN NASAL SPRAY NA) Place 1 spray into the nose daily as needed (congestion).      . Simethicone (GAS-X PO) Take 1 tablet by mouth daily as needed (flatulence).       . simvastatin (ZOCOR) 40 MG tablet TAKE 1 TABLET BY MOUTH AT BEDTIME FOR CHOLESTEROL  30 tablet  6  . spironolactone (ALDACTONE) 12.5 mg TABS tablet Take 0.5 tablets (12.5 mg total) by mouth daily.  30 tablet  6  . Vitamin D, Ergocalciferol, (DRISDOL) 50000 UNITS CAPS capsule TAKE 1 CAPSULE (50,000 UNITS TOTAL) BY MOUTH EVERY 7 (SEVEN) DAYS. TAKE ON FRIDAY  5 capsule  0   No current facility-administered medications for this visit.    Allergies:    Allergies  Allergen Reactions  . Aspirin Other (See Comments)    Causes nosebleeds  . Penicillins Itching  . Theophylline Nausea And Vomiting    Social History:  The patient  reports that she quit smoking about 21 years ago. Her  smoking use included Cigarettes. She has a 37.5 pack-year smoking history. She has never used smokeless tobacco. She reports that she does not drink alcohol or use illicit drugs.   Family History:  The patient's family history includes Breast cancer in her maternal aunt and sister; Cervical cancer in her mother; Diabetes in her father, mother, and sister; Heart disease in her mother; Kidney disease in her mother; Multiple sclerosis in her other and sister. There is no history of Colon cancer.    ROS:  Please see the history of present illness.  She notes some dysphagia.     All other systems reviewed and negative.    PHYSICAL EXAM: VS:  BP 110/59  Pulse 50  Ht 5\' 4"  (1.626 m)  Wt 231 lb (104.781 kg)  BMI 39.63 kg/m2 Chronically ill appearing female sitting in a wheelchair in no acute distress HEENT: normal Neck: no JVD at 90  Cardiac:  normal S1, S2; RRR; 0-0/8 systolic murmur at the RUSBno rub Lungs:  Decreased breath sounds bilaterally, no wheezing, rhonchi or rales Abd: distended, nontender, no hepatomegaly Ext: no edema Skin: warm and dry Neuro:  CNs 2-12 intact, no focal abnormalities noted  EKG:   Sinus bradycardia, HR 50, first degree AV block (PR 258), NSSTTW changes, no change from prior tracing   ASSESSMENT AND  PLAN:  1. Chest Pain:  Etiology not clear.  I reviewed her case with Dr. Burt Knack today.  She is not a candidate for invasive cardiac evaluation (ie cardiac cath) given her chronic GI blood loss.  She cannot take ASA or Plavix.  I am not convinced she is having unstable angina.  We reviewed the possibility of stress testing.  However, even with an abnormal stress test, we could not proceed with any aggressive management.  I believe that her anemia is contributing to most of her symptoms.  I discussed the possibility of adjusting her medications and adding nitrates vs continuing her current regimen.  She chooses to continue her current rx.   2. Dyspnea:  Her dyspnea is  multifactorial. Continue current cardiac regimen. 3. Chronic Diastolic CHF:  Volume appears stable.  No further changes to her current regimen. 4. Hypertension:  Controlled. 5. CAD:   Continue statin. 6. Aortic Stenosis:  Very mild by recent echocardiogram. Continue clinical follow up. 7. COPD:  Continue followup with primary care. 8. Anemia:  From chronic GI blood loss.  She is now receiving Aranesp injections and has had several transfusions with PRBCs as noted above. Recent Hgb is low and repeat transfusion with PRBCs is planned.  9. GAVE:  F/u with GI as planned.  10. Disposition:  F/u with Dr. Sherren Mocha in 6 mos.   Signed, Versie Starks, MHS 02/22/2014 12:33 PM    [facility] [address], [place], [place]  [address] Phone: [phone]; Fax: [phone]

## 2014-02-23 ENCOUNTER — Encounter (HOSPITAL_COMMUNITY)
Admission: RE | Admit: 2014-02-23 | Discharge: 2014-02-23 | Disposition: A | Discharge status: Home / Self Care | Payer: Medicare Other | Source: Ambulatory Visit | Attending: Pulmonary Disease | Admitting: Pulmonary Disease

## 2014-02-23 DIAGNOSIS — D509 Iron deficiency anemia, unspecified: Secondary | ICD-10-CM | POA: Diagnosis not present

## 2014-02-23 MED ORDER — FUROSEMIDE 10 MG/ML IJ SOLN
40.0000 mg | Freq: Once | INTRAMUSCULAR | Status: AC
  Administered 2014-02-23: 40 mg via INTRAVENOUS
  Filled 2014-02-23: qty 4

## 2014-02-24 LAB — TYPE AND SCREEN
ABO/RH(D): O POS
Antibody Screen: POSITIVE
DAT, IGG: NEGATIVE
Donor AG Type: NEGATIVE
UNIT DIVISION: 0

## 2014-02-25 ENCOUNTER — Other Ambulatory Visit: Payer: Self-pay | Admitting: Pulmonary Disease

## 2014-03-01 ENCOUNTER — Telehealth: Payer: Self-pay | Admitting: Pulmonary Disease

## 2014-03-01 MED ORDER — FLUTICASONE-SALMETEROL 250-50 MCG/DOSE IN AEPB: INHALATION_SPRAY | RESPIRATORY_TRACT | Status: DC

## 2014-03-01 NOTE — Telephone Encounter (Signed)
Called and spoke with pts husband and he is aware of samples that have been left up front and he will come by and pick these up.

## 2014-03-03 ENCOUNTER — Telehealth: Payer: Self-pay | Admitting: *Deleted

## 2014-03-03 NOTE — Telephone Encounter (Signed)
Message copied by Hulan Saas on Thu Mar 03, 2014  8:29 AM ------      Message from: Hulan Saas      Created: Thu Feb 17, 2014  9:40 AM       Call and remind due for Ammonia level for DB on 03/07/14. Lab in EPIC ------

## 2014-03-03 NOTE — Telephone Encounter (Signed)
Patient will come for labs next week. 

## 2014-03-07 ENCOUNTER — Telehealth: Payer: Self-pay | Admitting: Family Medicine

## 2014-03-07 ENCOUNTER — Other Ambulatory Visit (INDEPENDENT_AMBULATORY_CARE_PROVIDER_SITE_OTHER): Payer: Medicare Other

## 2014-03-07 ENCOUNTER — Other Ambulatory Visit (HOSPITAL_COMMUNITY): Payer: Self-pay | Admitting: *Deleted

## 2014-03-07 ENCOUNTER — Encounter (HOSPITAL_COMMUNITY)
Admission: RE | Admit: 2014-03-07 | Discharge: 2014-03-07 | Disposition: A | Discharge status: Home / Self Care | Payer: Medicare Other | Source: Ambulatory Visit | Attending: Family Medicine | Admitting: Family Medicine

## 2014-03-07 DIAGNOSIS — K7460 Unspecified cirrhosis of liver: Secondary | ICD-10-CM

## 2014-03-07 DIAGNOSIS — D509 Iron deficiency anemia, unspecified: Secondary | ICD-10-CM | POA: Diagnosis not present

## 2014-03-07 DIAGNOSIS — K7469 Other cirrhosis of liver: Secondary | ICD-10-CM

## 2014-03-07 LAB — CBC WITH DIFFERENTIAL/PLATELET
Basophils Absolute: 0 10*3/uL (ref 0.0–0.1)
Basophils Relative: 0 % (ref 0.0–3.0)
EOS PCT: 5.8 % — AB (ref 0.0–5.0)
Eosinophils Absolute: 0.2 10*3/uL (ref 0.0–0.7)
HEMATOCRIT: 24.5 % — AB (ref 36.0–46.0)
Hemoglobin: 8.3 g/dL — ABNORMAL LOW (ref 12.0–15.0)
LYMPHS ABS: 0.1 10*3/uL — AB (ref 0.7–4.0)
Lymphocytes Relative: 2.9 % — ABNORMAL LOW (ref 12.0–46.0)
MCHC: 34 g/dL (ref 30.0–36.0)
MCV: 102.5 fl — AB (ref 78.0–100.0)
MONO ABS: 0.2 10*3/uL (ref 0.1–1.0)
MONOS PCT: 8.5 % (ref 3.0–12.0)
NEUTROS ABS: 2.2 10*3/uL (ref 1.4–7.7)
Neutrophils Relative %: 82.8 % — ABNORMAL HIGH (ref 43.0–77.0)
Platelets: 74 10*3/uL — ABNORMAL LOW (ref 150.0–400.0)
RBC: 2.4 Mil/uL — ABNORMAL LOW (ref 3.87–5.11)
RDW: 16.9 % — ABNORMAL HIGH (ref 11.5–15.5)
WBC: 2.7 10*3/uL — AB (ref 4.0–10.5)

## 2014-03-07 LAB — POCT HEMOGLOBIN-HEMACUE: Hemoglobin: 7.8 g/dL — ABNORMAL LOW (ref 12.0–15.0)

## 2014-03-07 LAB — AMMONIA: Ammonia: 106 umol/L — ABNORMAL HIGH (ref 11–35)

## 2014-03-07 MED ORDER — DARBEPOETIN ALFA-POLYSORBATE 200 MCG/0.4ML IJ SOLN
200.0000 ug | INTRAMUSCULAR | Status: DC
  Administered 2014-03-07: 200 ug via SUBCUTANEOUS

## 2014-03-07 MED ORDER — DARBEPOETIN ALFA-POLYSORBATE 200 MCG/0.4ML IJ SOLN
INTRAMUSCULAR | Status: AC
  Filled 2014-03-07: qty 0.4

## 2014-03-07 NOTE — Telephone Encounter (Signed)
Kristen from [facility] called to report pt's Hgb 7.8.

## 2014-03-07 NOTE — Telephone Encounter (Signed)
plz schedule for 1 u pRBC with 20mg  lasix IV afterwards.

## 2014-03-07 NOTE — Progress Notes (Signed)
Called and spoke with [name] at Dr Gutierrez's office and reported hgb today of 7.8, and that the pt stated she felt ok today.  [name] said she would send a message to the Dr and if they want to do anything else they will call the patient at home, and I did not need to keep her here in short stay.

## 2014-03-07 NOTE — Telephone Encounter (Signed)
Kristen at [facility] notified and given verbal order. Appt scheduled for type and cross tomorrow at 2:00 and transfusion 03/09/14 at 8:00 am. Patient notified and verbalized understanding.

## 2014-03-08 ENCOUNTER — Encounter (HOSPITAL_COMMUNITY)
Admission: RE | Admit: 2014-03-08 | Discharge: 2014-03-08 | Disposition: A | Discharge status: Home / Self Care | Payer: Medicare Other | Source: Ambulatory Visit | Attending: Family Medicine | Admitting: Family Medicine

## 2014-03-08 ENCOUNTER — Other Ambulatory Visit: Payer: Self-pay

## 2014-03-08 DIAGNOSIS — D509 Iron deficiency anemia, unspecified: Secondary | ICD-10-CM | POA: Diagnosis not present

## 2014-03-08 LAB — PREPARE RBC (CROSSMATCH)

## 2014-03-08 MED ORDER — SIMVASTATIN 40 MG PO TABS: ORAL_TABLET | ORAL | Status: DC

## 2014-03-08 NOTE — Telephone Encounter (Signed)
Pt request refill simvastatin to [facility];pt notified done. Pt already has f/u appt with Dr Darnell Level.

## 2014-03-09 ENCOUNTER — Encounter (HOSPITAL_COMMUNITY)
Admission: RE | Admit: 2014-03-09 | Discharge: 2014-03-09 | Disposition: A | Discharge status: Home / Self Care | Payer: Medicare Other | Source: Ambulatory Visit | Attending: Family Medicine | Admitting: Family Medicine

## 2014-03-09 DIAGNOSIS — D509 Iron deficiency anemia, unspecified: Secondary | ICD-10-CM | POA: Diagnosis not present

## 2014-03-09 MED ORDER — FUROSEMIDE 10 MG/ML IJ SOLN
20.0000 mg | Freq: Once | INTRAMUSCULAR | Status: AC
  Administered 2014-03-09: 20 mg via INTRAVENOUS
  Filled 2014-03-09: qty 4

## 2014-03-10 LAB — TYPE AND SCREEN
ABO/RH(D): O POS
Antibody Screen: POSITIVE
DAT, IGG: NEGATIVE
DONOR AG TYPE: NEGATIVE
Unit division: 0

## 2014-03-20 ENCOUNTER — Emergency Department (HOSPITAL_COMMUNITY)
Admission: EM | Admit: 2014-03-20 | Discharge: 2014-03-21 | Disposition: A | Discharge status: Home / Self Care | Payer: Medicare Other | Attending: Emergency Medicine | Admitting: Emergency Medicine

## 2014-03-20 ENCOUNTER — Encounter (HOSPITAL_COMMUNITY): Payer: Self-pay | Admitting: Emergency Medicine

## 2014-03-20 DIAGNOSIS — R011 Cardiac murmur, unspecified: Secondary | ICD-10-CM | POA: Insufficient documentation

## 2014-03-20 DIAGNOSIS — G2581 Restless legs syndrome: Secondary | ICD-10-CM | POA: Diagnosis not present

## 2014-03-20 DIAGNOSIS — R109 Unspecified abdominal pain: Secondary | ICD-10-CM | POA: Diagnosis not present

## 2014-03-20 DIAGNOSIS — K5900 Constipation, unspecified: Secondary | ICD-10-CM | POA: Diagnosis not present

## 2014-03-20 DIAGNOSIS — M1990 Unspecified osteoarthritis, unspecified site: Secondary | ICD-10-CM | POA: Diagnosis not present

## 2014-03-20 DIAGNOSIS — Z87891 Personal history of nicotine dependence: Secondary | ICD-10-CM | POA: Diagnosis not present

## 2014-03-20 DIAGNOSIS — I359 Nonrheumatic aortic valve disorder, unspecified: Secondary | ICD-10-CM | POA: Diagnosis not present

## 2014-03-20 DIAGNOSIS — K625 Hemorrhage of anus and rectum: Secondary | ICD-10-CM | POA: Insufficient documentation

## 2014-03-20 DIAGNOSIS — Z880 Allergy status to penicillin: Secondary | ICD-10-CM | POA: Insufficient documentation

## 2014-03-20 DIAGNOSIS — I509 Heart failure, unspecified: Secondary | ICD-10-CM | POA: Diagnosis not present

## 2014-03-20 DIAGNOSIS — D500 Iron deficiency anemia secondary to blood loss (chronic): Secondary | ICD-10-CM | POA: Diagnosis not present

## 2014-03-20 DIAGNOSIS — I499 Cardiac arrhythmia, unspecified: Secondary | ICD-10-CM | POA: Diagnosis not present

## 2014-03-20 DIAGNOSIS — I10 Essential (primary) hypertension: Secondary | ICD-10-CM | POA: Insufficient documentation

## 2014-03-20 DIAGNOSIS — Z794 Long term (current) use of insulin: Secondary | ICD-10-CM | POA: Diagnosis not present

## 2014-03-20 DIAGNOSIS — IMO0002 Reserved for concepts with insufficient information to code with codable children: Secondary | ICD-10-CM | POA: Diagnosis not present

## 2014-03-20 DIAGNOSIS — M549 Dorsalgia, unspecified: Secondary | ICD-10-CM | POA: Diagnosis not present

## 2014-03-20 DIAGNOSIS — K7460 Unspecified cirrhosis of liver: Secondary | ICD-10-CM | POA: Insufficient documentation

## 2014-03-20 DIAGNOSIS — Z79899 Other long term (current) drug therapy: Secondary | ICD-10-CM | POA: Insufficient documentation

## 2014-03-20 DIAGNOSIS — I209 Angina pectoris, unspecified: Secondary | ICD-10-CM | POA: Insufficient documentation

## 2014-03-20 DIAGNOSIS — Z87440 Personal history of urinary (tract) infections: Secondary | ICD-10-CM | POA: Diagnosis not present

## 2014-03-20 DIAGNOSIS — Z860100 Personal history of colon polyps, unspecified: Secondary | ICD-10-CM | POA: Insufficient documentation

## 2014-03-20 DIAGNOSIS — K219 Gastro-esophageal reflux disease without esophagitis: Secondary | ICD-10-CM | POA: Diagnosis not present

## 2014-03-20 DIAGNOSIS — Z8742 Personal history of other diseases of the female genital tract: Secondary | ICD-10-CM | POA: Diagnosis not present

## 2014-03-20 DIAGNOSIS — Z8701 Personal history of pneumonia (recurrent): Secondary | ICD-10-CM | POA: Diagnosis not present

## 2014-03-20 DIAGNOSIS — J4489 Other specified chronic obstructive pulmonary disease: Secondary | ICD-10-CM | POA: Insufficient documentation

## 2014-03-20 DIAGNOSIS — F333 Major depressive disorder, recurrent, severe with psychotic symptoms: Secondary | ICD-10-CM | POA: Insufficient documentation

## 2014-03-20 DIAGNOSIS — F411 Generalized anxiety disorder: Secondary | ICD-10-CM | POA: Diagnosis not present

## 2014-03-20 DIAGNOSIS — M810 Age-related osteoporosis without current pathological fracture: Secondary | ICD-10-CM | POA: Insufficient documentation

## 2014-03-20 DIAGNOSIS — I2510 Atherosclerotic heart disease of native coronary artery without angina pectoris: Secondary | ICD-10-CM | POA: Insufficient documentation

## 2014-03-20 DIAGNOSIS — E119 Type 2 diabetes mellitus without complications: Secondary | ICD-10-CM | POA: Insufficient documentation

## 2014-03-20 DIAGNOSIS — J449 Chronic obstructive pulmonary disease, unspecified: Secondary | ICD-10-CM | POA: Insufficient documentation

## 2014-03-20 DIAGNOSIS — E7800 Pure hypercholesterolemia, unspecified: Secondary | ICD-10-CM | POA: Insufficient documentation

## 2014-03-20 DIAGNOSIS — Z792 Long term (current) use of antibiotics: Secondary | ICD-10-CM | POA: Insufficient documentation

## 2014-03-20 LAB — BASIC METABOLIC PANEL
Anion gap: 12 (ref 5–15)
BUN: 71 mg/dL — AB (ref 6–23)
CHLORIDE: 102 meq/L (ref 96–112)
CO2: 25 meq/L (ref 19–32)
Calcium: 9.1 mg/dL (ref 8.4–10.5)
Creatinine, Ser: 1.79 mg/dL — ABNORMAL HIGH (ref 0.50–1.10)
GFR calc non Af Amer: 28 mL/min — ABNORMAL LOW (ref >90)
GFR, EST AFRICAN AMERICAN: 32 mL/min — AB (ref >90)
Glucose, Bld: 167 mg/dL — ABNORMAL HIGH (ref 70–99)
POTASSIUM: 5.1 meq/L (ref 3.7–5.3)
Sodium: 139 mEq/L (ref 137–147)

## 2014-03-20 LAB — CBC
HEMATOCRIT: 24.7 % — AB (ref 36.0–46.0)
HEMOGLOBIN: 8.3 g/dL — AB (ref 12.0–15.0)
MCH: 34 pg (ref 26.0–34.0)
MCHC: 33.6 g/dL (ref 30.0–36.0)
MCV: 101.2 fL — ABNORMAL HIGH (ref 78.0–100.0)
Platelets: 74 10*3/uL — ABNORMAL LOW (ref 150–400)
RBC: 2.44 MIL/uL — AB (ref 3.87–5.11)
RDW: 16.2 % — ABNORMAL HIGH (ref 11.5–15.5)
WBC: 3.4 10*3/uL — AB (ref 4.0–10.5)

## 2014-03-20 NOTE — ED Provider Notes (Signed)
CSN: [id number]     Arrival date & time 03/20/14  2206 History   First MD Initiated Contact with Patient 03/20/14 2259     Chief Complaint  Patient presents with  . Fall  . Rectal Bleeding     (Consider location/radiation/quality/duration/timing/severity/associated sxs/prior Treatment) HPI Comments: Patient with remote Hx hemorrhoids and recent bout of constipation had a hard formed BM after 3 days and noted blood on tissue, she has had another large BM on arrival in ED without any note of blood.  States the abdominal pain has improved. She also reports that she fell 4 days when she fell over her walker and has chest wall tenderness   Patient is a 73 y.o. female presenting with fall and hematochezia. The history is provided by the patient.  Fall This is a new problem. The problem has been rapidly improving. Associated symptoms include abdominal pain, arthralgias and a change in bowel habit. Pertinent negatives include no chills, fever, nausea or vomiting. Nothing aggravates the symptoms. She has tried nothing for the symptoms.  Rectal Bleeding Associated symptoms: abdominal pain   Associated symptoms: no fever and no vomiting     Past Medical History  Diagnosis Date  . Chronic airway obstruction, not elsewhere classified   . Unspecified chronic bronchitis   . Unspecified essential hypertension   . Coronary atherosclerosis of unspecified type of vessel, native or graft   . Palpitations   . Aortic valve disorders   . Pure hypercholesterolemia   . Morbid obesity   . Esophageal reflux   . Angiodysplasia of intestine (without mention of hemorrhage)   . Diverticulosis of colon (without mention of hemorrhage)   . Benign neoplasm of colon   . Autoimmune hepatitis   . Cystitis, unspecified   . Osteoarthrosis, unspecified whether generalized or localized, unspecified site   . Osteoporosis, unspecified   . Restless legs syndrome (RLS)   . Major depressive disorder, recurrent episode,  severe, specified as with psychotic behavior   . Iron deficiency anemia secondary to blood loss (chronic)   . Coarse tremors     "just in my arms"  . Carpal tunnel syndrome on both sides   . Dysrhythmia     palpitations  . CHF (congestive heart failure)   . Asthma   . Shortness of breath     "any time really"  . Sleep apnea     "but I don't use the equipment"  . Type II diabetes mellitus   . Blood transfusion   . H/O hiatal hernia   . 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 Adenomatous colon polyp   . Anginal pain   . Anxiety   . Pneumonia   . Heart murmur   . AVM (arteriovenous malformation)   . Portal hypertensive gastropathy   . Falls frequently   . UTI (lower urinary tract infection)   . Hiatal hernia   . GAVE (gastric antral vascular ectasia)    Past Surgical History  Procedure Laterality Date  . Hemorroidectomy    . Appendectomy    . Vaginal hysterectomy    . Breast biopsy      bilateral  . Cesarean section      x 3  . Appendectomy    . Esophagogastroduodenoscopy  06/12/2012    Procedure: ESOPHAGOGASTRODUODENOSCOPY (EGD);  Surgeon: Lafayette Dragon, MD;  Location: [facility];  Service: Endoscopy;  Laterality: N/A;  . Balloon dilation  06/12/2012    Procedure: BALLOON DILATION;  Surgeon: Sydell Axon  Andris Baumann, MD;  Location: [facility];  Service: Endoscopy;  Laterality: N/A;  ?balloon  . Esophagogastroduodenoscopy  09/10/2012    Procedure: ESOPHAGOGASTRODUODENOSCOPY (EGD);  Surgeon: Lafayette Dragon, MD;  Location: [facility];  Service: Endoscopy;  Laterality: N/A;  . Hot hemostasis  09/10/2012    Procedure: HOT HEMOSTASIS (ARGON PLASMA COAGULATION/BICAP);  Surgeon: Lafayette Dragon, MD;  Location: [facility];  Service: Endoscopy;  Laterality: N/A;  . Esophagogastroduodenoscopy N/A 01/18/2013    Procedure: ESOPHAGOGASTRODUODENOSCOPY (EGD);  Surgeon: Lafayette Dragon, MD;  Location[facility];  Service: Endoscopy;  Laterality: N/A;  . Esophagogastroduodenoscopy N/A 05/24/2013     Procedure: ESOPHAGOGASTRODUODENOSCOPY (EGD);  Surgeon: Jerene Bears, MD;  Location: [facility];  Service: Gastroenterology;  Laterality: N/A;  . Hot hemostasis N/A 05/24/2013    Procedure: HOT HEMOSTASIS (ARGON PLASMA COAGULATION/BICAP);  Surgeon: Jerene Bears, MD;  Location: [facility];  Service: Gastroenterology;  Laterality: N/A;   Family History  Problem Relation Age of Onset  . Heart disease Mother   . Cervical cancer Mother   . Kidney disease Mother   . Diabetes Mother   . Breast cancer Sister   . Multiple sclerosis Sister   . Colon cancer Neg Hx   . Breast cancer Maternal Aunt     x 2  . Diabetes Father   . Diabetes Sister   . Multiple sclerosis Other    History  Substance Use Topics  . Smoking status: Former Smoker -- 1.50 packs/day for 25 years    Types: Cigarettes    Quit date: 08/26/1992  . Smokeless tobacco: Never Used  . Alcohol Use: No     Comment: 2/69/48 "last alcoholic drink was years ago"   OB History   Grav Para Term Preterm Abortions TAB SAB Ect Mult Living                 Review of Systems  Constitutional: Negative for fever and chills.  Gastrointestinal: Positive for abdominal pain, constipation, blood in stool, hematochezia and change in bowel habit. Negative for nausea, vomiting and diarrhea.  Musculoskeletal: Positive for arthralgias and back pain.  Skin: Negative for wound.  All other systems reviewed and are negative.     Allergies  Aspirin; Penicillins; and Theophylline  Home Medications   Prior to Admission medications   Medication Sig Start Date End Date Taking? Authorizing Provider  albuterol (PROAIR HFA) 108 (90 BASE) MCG/ACT inhaler Inhale 2 puffs into the lungs every 6 (six) hours as needed for wheezing or shortness of breath.    Yes Historical Provider, MD  ALPRAZolam Duanne Moron) 1 MG tablet Take 1 mg by mouth 3 (three) times daily as needed for anxiety.    Yes Historical Provider, MD  AMBULATORY NON FORMULARY MEDICATION  Continuous O2 @@ 3LMP   Yes Historical Provider, MD  b complex vitamins tablet Take 1 tablet by mouth daily.    Yes Historical Provider, MD  buPROPion (WELLBUTRIN XL) 150 MG 24 hr tablet Take 150 mg by mouth daily.   Yes Historical Provider, MD  desvenlafaxine (PRISTIQ) 100 MG 24 hr tablet Take 100 mg by mouth daily. Takes with a 50mg  tablet, total daily dose is 150mg    Yes Historical Provider, MD  desvenlafaxine (PRISTIQ) 50 MG 24 hr tablet Take 50 mg by mouth daily. Takes with a 100mg  tablet, total daily dose is 150mg    Yes Historical Provider, MD  dextromethorphan (DELSYM) 30 MG/5ML liquid Take 60 mg by mouth at bedtime as needed for cough.  Yes Historical Provider, MD  Fluticasone-Salmeterol (ADVAIR) 250-50 MCG/DOSE AEPB Inhale 1 puff into the lungs 2 (two) times daily.   Yes Historical Provider, MD  furosemide (LASIX) 40 MG tablet Take 1 1/2 tablets (60 mg) in the morning and 1 tablet in the afternoon 12/08/13  Yes Sherren Mocha, MD  guaiFENesin (MUCINEX) 600 MG 12 hr tablet Take 600 mg by mouth. 2 tablet by mouth once daily   Yes Historical Provider, MD  Insulin Glargine (LANTUS SOLOSTAR [place]) Inject 140 Units into the skin daily before supper.    Yes Historical Provider, MD  insulin lispro (HUMALOG) 100 UNIT/ML KiwkPen Inject 0-25 Units into the skin daily with supper.  07/15/13  Yes Renato Shin, MD  lactulose, encephalopathy, (CHRONULAC) 10 GM/15ML SOLN Take 20 g by mouth daily. constipation 12/06/13  Yes Historical Provider, MD  levalbuterol (XOPENEX) 0.63 MG/3ML nebulizer solution Take 0.63 mg by nebulization every 4 (four) hours as needed for wheezing or shortness of breath.   Yes Historical Provider, MD  lidocaine (LIDODERM) 5 % Place 1 patch onto the skin daily as needed (apply to right shoulder or right back rib area for pain). Remove & Discard patch within 12 hours or as directed by MD   Yes Historical Provider, MD  lisinopril (PRINIVIL,ZESTRIL) 5 MG tablet Take 5 mg by mouth at bedtime.     Yes Historical Provider, MD  Multiple Vitamins-Minerals (CENTRUM SILVER PO) Take 1 tablet by mouth daily.    Yes Historical Provider, MD  nadolol (CORGARD) 20 MG tablet Take 10 mg by mouth daily.   Yes Historical Provider, MD  oxyCODONE (OXY IR/ROXICODONE) 5 MG immediate release tablet Take 5 mg by mouth every 4 (four) hours as needed for moderate pain. prn 11/30/13  Yes Historical Provider, MD  pantoprazole (PROTONIX) 40 MG tablet Take 40 mg by mouth daily.  05/31/13  Yes Noralee Space, MD  polyethylene glycol (MIRALAX / GLYCOLAX) packet Take 17 g by mouth daily as needed (constipation).   Yes Historical Provider, MD  promethazine (PHENERGAN) 25 MG tablet Take 1 tablet (25 mg total) by mouth 2 (two) times daily as needed for nausea. 10/13/13  Yes Noralee Space, MD  QUEtiapine (SEROQUEL) 400 MG tablet Take 400 mg by mouth at bedtime.   Yes Historical Provider, MD  rifaximin (XIFAXAN) 550 MG TABS tablet Take 1 tablet (550 mg total) by mouth daily. 02/11/14  Yes Lafayette Dragon, MD  rOPINIRole (REQUIP) 4 MG tablet Take 4 mg by mouth at bedtime.   Yes Historical Provider, MD  Saline (OCEAN NASAL SPRAY NA) Place 1 spray into the nose 3 (three) times daily as needed (congestion).    Yes Historical Provider, MD  Simethicone (GAS-X PO) Take 1 tablet by mouth daily as needed (flatulence).    Yes Historical Provider, MD  simvastatin (ZOCOR) 40 MG tablet Take 40 mg by mouth daily.   Yes Historical Provider, MD  spironolactone (ALDACTONE) 12.5 mg TABS tablet Take 0.5 tablets (12.5 mg total) by mouth daily. 11/23/13  Yes Sherren Mocha, MD  Vitamin D, Ergocalciferol, (DRISDOL) 50000 UNITS CAPS capsule Take 50,000 Units by mouth every 7 (seven) days. On Fridays   Yes Historical Provider, MD   BP 140/56  Pulse 55  Temp(Src) 99.2 F (37.3 C) (Oral)  Resp 16  SpO2 94% Physical Exam  Nursing note and vitals reviewed. Constitutional: She is oriented to person, place, and time. She appears well-developed and  well-nourished.  HENT:  Head: Normocephalic.  Eyes: Pupils are  equal, round, and reactive to light.  Neck: Normal range of motion.  Cardiovascular: Normal rate and regular rhythm.   Pulmonary/Chest: Effort normal and breath sounds normal. She exhibits tenderness.  Abdominal: Soft. Bowel sounds are normal. She exhibits no distension. There is no tenderness.  Genitourinary: Rectal exam shows no external hemorrhoid, no internal hemorrhoid, no fissure, no tenderness and anal tone normal.  Soft yellow stool in rectal vault  No internal or external hemorrhoids  Musculoskeletal: Normal range of motion.  Neurological: She is alert and oriented to person, place, and time.  Skin: Skin is warm and dry.    ED Course  Procedures (including critical care time) Labs Review Labs Reviewed  CBC - Abnormal; Notable for the following:    WBC 3.4 (*)    RBC 2.44 (*)    Hemoglobin 8.3 (*)    HCT 24.7 (*)    MCV 101.2 (*)    RDW 16.2 (*)    Platelets 74 (*)    All other components within normal limits  BASIC METABOLIC PANEL - Abnormal; Notable for the following:    Glucose, Bld 167 (*)    BUN 71 (*)    Creatinine, Ser 1.79 (*)    GFR calc non Af Amer 28 (*)    GFR calc Af Amer 32 (*)    All other components within normal limits  POC OCCULT BLOOD, ED    Imaging Review No results found.   EKG Interpretation None      MDM  Patient's hemoglobin and hemo crit are stable  Final diagnoses:  Constipation, unspecified constipation type         Garald Balding, NP 03/21/14 0025

## 2014-03-20 NOTE — ED Notes (Addendum)
Fell on Thursday. Describes as mechanical fall, tripped over walker. C/o R sternal chest pain r/t fall. reports sternal bruise. Noticed rectal bleeding tonight. Describes as "have been constipated and had to strain". also "bright red with clots". On toilet tissue and in commode. Reports many chronic conditions, denies changes to old sx or other new sx. Mentions chronic: sob, dizziness, generally weak, intermitant nausea. Alert, NAD, calm, interactive, resps e/u. Uses 3L O2 [place] at home.

## 2014-03-21 ENCOUNTER — Encounter (HOSPITAL_COMMUNITY)
Admission: RE | Admit: 2014-03-21 | Discharge: 2014-03-21 | Disposition: A | Discharge status: Home / Self Care | Payer: Medicare Other | Source: Ambulatory Visit | Attending: Family Medicine | Admitting: Family Medicine

## 2014-03-21 DIAGNOSIS — D509 Iron deficiency anemia, unspecified: Secondary | ICD-10-CM | POA: Diagnosis not present

## 2014-03-21 LAB — POC OCCULT BLOOD, ED: Fecal Occult Bld: POSITIVE — AB

## 2014-03-21 LAB — POCT HEMOGLOBIN-HEMACUE: Hemoglobin: 8.1 g/dL — ABNORMAL LOW (ref 12.0–15.0)

## 2014-03-21 MED ORDER — DARBEPOETIN ALFA-POLYSORBATE 200 MCG/0.4ML IJ SOLN
200.0000 ug | INTRAMUSCULAR | Status: DC
  Administered 2014-03-21: 200 ug via SUBCUTANEOUS

## 2014-03-21 MED ORDER — DARBEPOETIN ALFA-POLYSORBATE 200 MCG/0.4ML IJ SOLN
INTRAMUSCULAR | Status: AC
  Filled 2014-03-21: qty 0.4

## 2014-03-21 NOTE — ED Notes (Signed)
Pt was up to bedside commode to void.  She removed her O2 while doing so, O2 sat dropped to mid 80's.  Pt instructed to leave O2 cannula in place.  It is hooked up to O2 close to bedside commode, no need to remove it.

## 2014-03-21 NOTE — ED Provider Notes (Signed)
Medical screening examination/treatment/procedure(s) were conducted as a shared visit with non-physician practitioner(s) and myself.  I personally evaluated the patient during the encounter.   EKG Interpretation None     NCAT PERRL  MMM [id number] CTAB NABS, soft FROM  [name] Beane K Llewelyn Sheaffer-Rasch, MD 03/21/14 [phone]

## 2014-03-21 NOTE — Discharge Instructions (Signed)
Constipation Constipation is when a person:  Poops (has a bowel movement) less than 3 times a week.  Has a hard time pooping.  Has poop that is dry, hard, or bigger than normal. HOME CARE   Eat foods with a lot of fiber in them. This includes fruits, vegetables, beans, and whole grains such as brown rice.  Avoid fatty foods and foods with a lot of sugar. This includes french fries, hamburgers, cookies, candy, and soda.  If you are not getting enough fiber from food, take products with added fiber in them (supplements).  Drink enough fluid to keep your pee (urine) clear or pale yellow.  Exercise on a regular basis, or as told by your doctor.  Go to the restroom when you feel like you need to poop. Do not hold it.  Only take medicine as told by your doctor. Do not take medicines that help you poop (laxatives) without talking to your doctor first. GET HELP RIGHT AWAY IF:   You have bright red blood in your poop (stool).  Your constipation lasts more than 4 days or gets worse.  You have belly (abdominal) or butt (rectal) pain.  You have thin poop (as thin as a pencil).  You lose weight, and it cannot be explained. MAKE SURE YOU:   Understand these instructions.  Will watch your condition.  Will get help right away if you are not doing well or get worse. Document Released: 01/29/2008 Document Revised: 08/17/2013 Document Reviewed: 05/24/2013 [facility] Patient Information 2015 [place], [place]. This information is not intended to replace advice given to you by your health care provider. Make sure you discuss any questions you have with your health care provider. Please call your PCP to report tonight's ED visit  And discuss your constipation

## 2014-03-22 ENCOUNTER — Ambulatory Visit (INDEPENDENT_AMBULATORY_CARE_PROVIDER_SITE_OTHER): Payer: Medicare Other | Admitting: Family Medicine

## 2014-03-22 ENCOUNTER — Ambulatory Visit (INDEPENDENT_AMBULATORY_CARE_PROVIDER_SITE_OTHER)
Admission: RE | Admit: 2014-03-22 | Discharge: 2014-03-22 | Disposition: A | Discharge status: Home / Self Care | Payer: Medicare Other | Source: Ambulatory Visit | Attending: Family Medicine | Admitting: Family Medicine

## 2014-03-22 ENCOUNTER — Encounter: Payer: Self-pay | Admitting: Family Medicine

## 2014-03-22 VITALS — BP 126/84 | HR 54 | Temp 98.1°F | Wt 232.0 lb

## 2014-03-22 DIAGNOSIS — R5381 Other malaise: Secondary | ICD-10-CM

## 2014-03-22 DIAGNOSIS — I5032 Chronic diastolic (congestive) heart failure: Secondary | ICD-10-CM

## 2014-03-22 DIAGNOSIS — I509 Heart failure, unspecified: Secondary | ICD-10-CM

## 2014-03-22 DIAGNOSIS — R079 Chest pain, unspecified: Secondary | ICD-10-CM

## 2014-03-22 DIAGNOSIS — E7800 Pure hypercholesterolemia, unspecified: Secondary | ICD-10-CM

## 2014-03-22 DIAGNOSIS — N289 Disorder of kidney and ureter, unspecified: Secondary | ICD-10-CM

## 2014-03-22 DIAGNOSIS — R0789 Other chest pain: Secondary | ICD-10-CM | POA: Insufficient documentation

## 2014-03-22 DIAGNOSIS — E1049 Type 1 diabetes mellitus with other diabetic neurological complication: Secondary | ICD-10-CM

## 2014-03-22 DIAGNOSIS — D500 Iron deficiency anemia secondary to blood loss (chronic): Secondary | ICD-10-CM

## 2014-03-22 LAB — LIPID PANEL
CHOL/HDL RATIO: 3
Cholesterol: 108 mg/dL (ref 0–200)
HDL: 36 mg/dL — AB (ref >39.00)
LDL Cholesterol: 57 mg/dL (ref 0–99)
NonHDL: 72
TRIGLYCERIDES: 75 mg/dL (ref 0.0–149.0)
VLDL: 15 mg/dL (ref 0.0–40.0)

## 2014-03-22 LAB — HEMOGLOBIN A1C: Hgb A1c MFr Bld: 5.3 % (ref 4.6–6.5)

## 2014-03-22 NOTE — Patient Instructions (Addendum)
Labwork today, xray today - we will call you with results. I recommend using pillow whenever coughing for pain control. I also recommend scheduling oxycodone 5mg  three times daily for pain control for next 1 week. Return to see me in 1-2 months for follow up, sooner if needed

## 2014-03-22 NOTE — Assessment & Plan Note (Signed)
Ongoing loss. Q2 wk aranesp, discussed possibly trying 2 units at a time.

## 2014-03-22 NOTE — Assessment & Plan Note (Signed)
Stable on latest check yesterday.

## 2014-03-22 NOTE — Assessment & Plan Note (Signed)
Chronic, continue statin. Check FLP today. Last meal was breakfast around 5am.

## 2014-03-22 NOTE — Assessment & Plan Note (Signed)
With recent falls. Pt declines HH PT currently 2/2 sternal pain, but also feels may not want as has not noticed improvement with previous PT. Consider eval for motorzide wheelchair.

## 2014-03-22 NOTE — Assessment & Plan Note (Signed)
Check sternal Xray today to r/o sternal fracture given marked point tenderness

## 2014-03-22 NOTE — Assessment & Plan Note (Signed)
Seems euvolemic today. No changes indicated.

## 2014-03-22 NOTE — Progress Notes (Addendum)
BP 142/78  Pulse 54  Temp(Src) 98.1 F (36.7 C) (Oral)  Wt 232 lb (105.235 kg)  SpO2 99%   CC: 2 mo f/u visit  Subjective:    Patient ID: [name], female    DOB: 08-Jul-1941, 73 y.o.   MRN: [id number]  HPI: [name] is a 73 y.o. 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 Hasn't seen any more bleed since ER visit. Fell onto chair and hit mid chest with arm rest. Also hit back. Records reviewed. Not feeling well today. Main concern is chest wall pain. Has been taking hydrocodone for pain which helps control pain. Pain worse with coughing.   DM - due for recheck. 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 Olevia Perches Q3 months. On Xifaxan 550 mg once daily and lactulose 2 tablespoons (30 mg) once daily. Receiving aranesp injections Q2 wks  Chronic diastolic CHF with mild aortic stenosis - followed by cardiology Dr. Burt Knack. Some chest pain, but not candidate for invasive treatment options (ie cath), rec against rpt stress test. Thought due to anemia + other comorbidities.  Relevant past medical, surgical, family and social history reviewed and updated as indicated.  Allergies and medications reviewed and updated. Current Outpatient Prescriptions on File Prior to Visit  Medication Sig  . albuterol (PROAIR HFA) 108 (90 BASE) MCG/ACT inhaler Inhale 2 puffs into the lungs every 6  (six) hours as needed for wheezing or shortness of breath.   . ALPRAZolam (XANAX) 1 MG tablet Take 1 mg by mouth 3 (three) times daily as needed for anxiety.   . AMBULATORY NON FORMULARY MEDICATION Continuous O2 @@ 3LMP  . b complex vitamins tablet Take 1 tablet by mouth daily.   [name] buPROPion (WELLBUTRIN XL) 150 MG 24 hr tablet Take 150 mg by mouth daily.  [name] desvenlafaxine (PRISTIQ) 100 MG 24 hr tablet Take 100 mg by mouth daily. Takes with a 50mg  tablet, total daily dose is 150mg   . desvenlafaxine (PRISTIQ) 50 MG 24 hr tablet Take 50 mg by mouth daily. Takes with a 100mg  tablet, total daily dose is 150mg   . dextromethorphan (DELSYM) 30 MG/5ML liquid Take 60 mg by mouth at bedtime as needed for cough.   . Fluticasone-Salmeterol (ADVAIR) 250-50 MCG/DOSE AEPB Inhale 1 puff into the lungs 2 (two) times daily.  . furosemide (LASIX) 40 MG tablet Take 1 1/2 tablets (60 mg) in the morning and 1 tablet in the afternoon  . guaiFENesin (MUCINEX) 600 MG 12 hr tablet Take 600 mg by mouth 2 (two) times daily. 2 tablet by mouth once daily  . Insulin Glargine (LANTUS SOLOSTAR [place]) Inject 140 Units into the skin daily before supper.   . insulin lispro (HUMALOG) 100 UNIT/ML KiwkPen Inject 0-25 Units into the skin daily with supper.   . lactulose, encephalopathy, (CHRONULAC) 10 GM/15ML SOLN Take 20 g by mouth daily. constipation  . levalbuterol (XOPENEX) 0.63 MG/3ML nebulizer solution Take  0.63 mg by nebulization every 4 (four) hours as needed for wheezing or shortness of breath.  . lidocaine (LIDODERM) 5 % Place 1 patch onto the skin daily as needed (apply to right shoulder or right back rib area for pain). Remove & Discard patch within 12 hours or as directed by MD  . lisinopril (PRINIVIL,ZESTRIL) 5 MG tablet Take 5 mg by mouth at bedtime.   . Multiple Vitamins-Minerals (CENTRUM SILVER PO) Take 1 tablet by mouth daily.   . nadolol (CORGARD) 20 MG tablet Take 10 mg by mouth daily.  [name] oxyCODONE (OXY IR/ROXICODONE) 5  MG immediate release tablet Take 5 mg by mouth every 4 (four) hours as needed for moderate pain. prn  . pantoprazole (PROTONIX) 40 MG tablet Take 40 mg by mouth daily.   . polyethylene glycol (MIRALAX / GLYCOLAX) packet Take 17 g by mouth daily as needed (constipation).  . promethazine (PHENERGAN) 25 MG tablet Take 1 tablet (25 mg total) by mouth 2 (two) times daily as needed for nausea.  [name] QUEtiapine (SEROQUEL) 400 MG tablet Take 400 mg by mouth at bedtime.  . rifaximin (XIFAXAN) 550 MG TABS tablet Take 1 tablet (550 mg total) by mouth daily.  [name] rOPINIRole (REQUIP) 4 MG tablet Take 4 mg by mouth at bedtime.  . Saline (OCEAN NASAL SPRAY NA) Place 1 spray into the nose 3 (three) times daily as needed (congestion).   . Simethicone (GAS-X PO) Take 1 tablet by mouth daily as needed (flatulence).   . simvastatin (ZOCOR) 40 MG tablet Take 40 mg by mouth daily.  [name] spironolactone (ALDACTONE) 12.5 mg TABS tablet Take 0.5 tablets (12.5 mg total) by mouth daily.  . Vitamin D, Ergocalciferol, (DRISDOL) 50000 UNITS CAPS capsule Take 50,000 Units by mouth every 7 (seven) days. On Fridays   No current facility-administered medications on file prior to visit.    Review of Systems Per HPI unless specifically indicated above    Objective:    BP 142/78  Pulse 54  Temp(Src) 98.1 F (36.7 C) (Oral)  Wt 232 lb (105.235 kg)  SpO2 99%  Physical Exam  Nursing note and vitals reviewed. Constitutional: She appears well-developed and well-nourished. No distress.  Chronically ill appearing, obese  HENT:  Mouth/Throat: Oropharynx is clear and moist. No oropharyngeal exudate.  Cardiovascular: Normal rate, regular rhythm and intact distal pulses.   Murmur (3/6 SEM best at LUSB) heard. Pulmonary/Chest: Effort normal and breath sounds normal. No respiratory distress. She has no wheezes. She has no rales. She exhibits tenderness.  Mild ecchymosis mid chest, tender to palpation mid sternum and R mid costochondral  joints  Musculoskeletal: She exhibits no edema.  Skin: Skin is warm and dry.   Results for orders placed during the hospital encounter of 03/21/14  POCT HEMOGLOBIN-HEMACUE      Result Value Ref Range   Hemoglobin 8.1 (*) 12.0 - 15.0 g/dL      Assessment & Plan:   Problem List Items Addressed This Visit   Type I (juvenile type) diabetes mellitus with neurological manifestations, not stated as uncontrolled (Chronic)      Check A1c today. H/o good control. Followed by endo Dr. Loanne Drilling but pt has not seen recently. Lab Results  Component Value Date   HGBA1C 6.5 08/30/2013      Relevant Orders      Hemoglobin A1c   Sternal pain - Primary     Check sternal Xray today to r/o sternal fracture given marked point tenderness    Relevant  Orders      DG Sternum   Renal insufficiency     Stable on latest check yesterday.    Physical deconditioning (Chronic)     With recent falls. Pt declines HH PT currently 2/2 sternal pain, but also feels may not want as has not noticed improvement with previous PT. Consider eval for motorzide wheelchair.    IRON DEFICIENCY ANEMIA SECONDARY TO BLOOD LOSS (Chronic)     Ongoing loss. Q2 wk aranesp, discussed possibly trying 2 units at a time.     HYPERCHOLESTEROLEMIA (Chronic)     Chronic, continue statin. Check FLP today. Last meal was breakfast around 5am.    Relevant Orders      Lipid panel   Chronic diastolic CHF (congestive heart failure) (Chronic)     Seems euvolemic today. No changes indicated.        Follow up plan: No Follow-up on file.

## 2014-03-22 NOTE — Assessment & Plan Note (Signed)
Check A1c today. H/o good control. Followed by endo Dr. Loanne Drilling but pt has not seen recently. Lab Results  Component Value Date   HGBA1C 6.5 08/30/2013

## 2014-03-22 NOTE — Progress Notes (Signed)
Pre visit review using our clinic review tool, if applicable. No additional management support is needed unless otherwise documented below in the visit note. 

## 2014-03-25 ENCOUNTER — Emergency Department (HOSPITAL_COMMUNITY): Payer: Medicare Other

## 2014-03-25 ENCOUNTER — Inpatient Hospital Stay (HOSPITAL_COMMUNITY)
Admission: EM | Admit: 2014-03-25 | Discharge: 2014-03-31 | DRG: 183 | Disposition: A | Discharge status: Skilled Nursing Facility | Payer: Medicare Other | Attending: Internal Medicine | Admitting: Internal Medicine

## 2014-03-25 ENCOUNTER — Encounter (HOSPITAL_COMMUNITY): Payer: Self-pay | Admitting: Emergency Medicine

## 2014-03-25 DIAGNOSIS — K31819 Angiodysplasia of stomach and duodenum without bleeding: Secondary | ICD-10-CM | POA: Diagnosis present

## 2014-03-25 DIAGNOSIS — G9340 Encephalopathy, unspecified: Secondary | ICD-10-CM | POA: Diagnosis present

## 2014-03-25 DIAGNOSIS — R404 Transient alteration of awareness: Secondary | ICD-10-CM | POA: Diagnosis present

## 2014-03-25 DIAGNOSIS — Z886 Allergy status to analgesic agent status: Secondary | ICD-10-CM | POA: Diagnosis not present

## 2014-03-25 DIAGNOSIS — F333 Major depressive disorder, recurrent, severe with psychotic symptoms: Secondary | ICD-10-CM | POA: Diagnosis present

## 2014-03-25 DIAGNOSIS — Z9181 History of falling: Secondary | ICD-10-CM

## 2014-03-25 DIAGNOSIS — S2691XA Contusion of heart, unspecified with or without hemopericardium, initial encounter: Secondary | ICD-10-CM | POA: Diagnosis present

## 2014-03-25 DIAGNOSIS — I10 Essential (primary) hypertension: Secondary | ICD-10-CM

## 2014-03-25 DIAGNOSIS — R0600 Dyspnea, unspecified: Secondary | ICD-10-CM

## 2014-03-25 DIAGNOSIS — Z880 Allergy status to penicillin: Secondary | ICD-10-CM | POA: Diagnosis not present

## 2014-03-25 DIAGNOSIS — J9620 Acute and chronic respiratory failure, unspecified whether with hypoxia or hypercapnia: Secondary | ICD-10-CM | POA: Diagnosis present

## 2014-03-25 DIAGNOSIS — Z794 Long term (current) use of insulin: Secondary | ICD-10-CM

## 2014-03-25 DIAGNOSIS — J9611 Chronic respiratory failure with hypoxia: Secondary | ICD-10-CM

## 2014-03-25 DIAGNOSIS — K5520 Angiodysplasia of colon without hemorrhage: Secondary | ICD-10-CM

## 2014-03-25 DIAGNOSIS — F411 Generalized anxiety disorder: Secondary | ICD-10-CM | POA: Diagnosis present

## 2014-03-25 DIAGNOSIS — M810 Age-related osteoporosis without current pathological fracture: Secondary | ICD-10-CM | POA: Diagnosis present

## 2014-03-25 DIAGNOSIS — E875 Hyperkalemia: Secondary | ICD-10-CM | POA: Diagnosis present

## 2014-03-25 DIAGNOSIS — D61818 Other pancytopenia: Secondary | ICD-10-CM | POA: Diagnosis present

## 2014-03-25 DIAGNOSIS — I359 Nonrheumatic aortic valve disorder, unspecified: Secondary | ICD-10-CM | POA: Diagnosis present

## 2014-03-25 DIAGNOSIS — Z888 Allergy status to other drugs, medicaments and biological substances status: Secondary | ICD-10-CM | POA: Diagnosis not present

## 2014-03-25 DIAGNOSIS — N390 Urinary tract infection, site not specified: Secondary | ICD-10-CM | POA: Diagnosis present

## 2014-03-25 DIAGNOSIS — S2239XA Fracture of one rib, unspecified side, initial encounter for closed fracture: Secondary | ICD-10-CM | POA: Diagnosis present

## 2014-03-25 DIAGNOSIS — K7290 Hepatic failure, unspecified without coma: Secondary | ICD-10-CM | POA: Diagnosis present

## 2014-03-25 DIAGNOSIS — A498 Other bacterial infections of unspecified site: Secondary | ICD-10-CM | POA: Diagnosis present

## 2014-03-25 DIAGNOSIS — J441 Chronic obstructive pulmonary disease with (acute) exacerbation: Secondary | ICD-10-CM | POA: Diagnosis present

## 2014-03-25 DIAGNOSIS — G2581 Restless legs syndrome: Secondary | ICD-10-CM | POA: Diagnosis present

## 2014-03-25 DIAGNOSIS — Q279 Congenital malformation of peripheral vascular system, unspecified: Secondary | ICD-10-CM

## 2014-03-25 DIAGNOSIS — I2489 Other forms of acute ischemic heart disease: Secondary | ICD-10-CM | POA: Diagnosis present

## 2014-03-25 DIAGNOSIS — R799 Abnormal finding of blood chemistry, unspecified: Secondary | ICD-10-CM | POA: Diagnosis present

## 2014-03-25 DIAGNOSIS — I959 Hypotension, unspecified: Secondary | ICD-10-CM | POA: Diagnosis present

## 2014-03-25 DIAGNOSIS — J189 Pneumonia, unspecified organism: Secondary | ICD-10-CM | POA: Diagnosis present

## 2014-03-25 DIAGNOSIS — I509 Heart failure, unspecified: Secondary | ICD-10-CM | POA: Diagnosis present

## 2014-03-25 DIAGNOSIS — J9610 Chronic respiratory failure, unspecified whether with hypoxia or hypercapnia: Secondary | ICD-10-CM | POA: Diagnosis present

## 2014-03-25 DIAGNOSIS — K7460 Unspecified cirrhosis of liver: Secondary | ICD-10-CM | POA: Diagnosis present

## 2014-03-25 DIAGNOSIS — I5032 Chronic diastolic (congestive) heart failure: Secondary | ICD-10-CM | POA: Diagnosis present

## 2014-03-25 DIAGNOSIS — K922 Gastrointestinal hemorrhage, unspecified: Secondary | ICD-10-CM | POA: Diagnosis present

## 2014-03-25 DIAGNOSIS — J45901 Unspecified asthma with (acute) exacerbation: Secondary | ICD-10-CM

## 2014-03-25 DIAGNOSIS — E871 Hypo-osmolality and hyponatremia: Secondary | ICD-10-CM | POA: Diagnosis present

## 2014-03-25 DIAGNOSIS — G4733 Obstructive sleep apnea (adult) (pediatric): Secondary | ICD-10-CM | POA: Diagnosis present

## 2014-03-25 DIAGNOSIS — K754 Autoimmune hepatitis: Secondary | ICD-10-CM | POA: Diagnosis present

## 2014-03-25 DIAGNOSIS — W19XXXA Unspecified fall, initial encounter: Secondary | ICD-10-CM | POA: Diagnosis present

## 2014-03-25 DIAGNOSIS — Z87891 Personal history of nicotine dependence: Secondary | ICD-10-CM | POA: Diagnosis not present

## 2014-03-25 DIAGNOSIS — I498 Other specified cardiac arrhythmias: Secondary | ICD-10-CM | POA: Diagnosis present

## 2014-03-25 DIAGNOSIS — N179 Acute kidney failure, unspecified: Secondary | ICD-10-CM | POA: Diagnosis present

## 2014-03-25 DIAGNOSIS — R0789 Other chest pain: Secondary | ICD-10-CM

## 2014-03-25 DIAGNOSIS — G5600 Carpal tunnel syndrome, unspecified upper limb: Secondary | ICD-10-CM | POA: Diagnosis present

## 2014-03-25 DIAGNOSIS — J449 Chronic obstructive pulmonary disease, unspecified: Secondary | ICD-10-CM

## 2014-03-25 DIAGNOSIS — I248 Other forms of acute ischemic heart disease: Secondary | ICD-10-CM | POA: Diagnosis present

## 2014-03-25 DIAGNOSIS — K7689 Other specified diseases of liver: Secondary | ICD-10-CM | POA: Diagnosis present

## 2014-03-25 DIAGNOSIS — M1990 Unspecified osteoarthritis, unspecified site: Secondary | ICD-10-CM | POA: Diagnosis present

## 2014-03-25 DIAGNOSIS — S2220XA Unspecified fracture of sternum, initial encounter for closed fracture: Secondary | ICD-10-CM | POA: Diagnosis present

## 2014-03-25 DIAGNOSIS — R071 Chest pain on breathing: Secondary | ICD-10-CM | POA: Diagnosis present

## 2014-03-25 DIAGNOSIS — D500 Iron deficiency anemia secondary to blood loss (chronic): Secondary | ICD-10-CM | POA: Diagnosis present

## 2014-03-25 DIAGNOSIS — I4891 Unspecified atrial fibrillation: Secondary | ICD-10-CM | POA: Diagnosis present

## 2014-03-25 DIAGNOSIS — I2510 Atherosclerotic heart disease of native coronary artery without angina pectoris: Secondary | ICD-10-CM | POA: Diagnosis present

## 2014-03-25 DIAGNOSIS — R7989 Other specified abnormal findings of blood chemistry: Secondary | ICD-10-CM | POA: Diagnosis present

## 2014-03-25 DIAGNOSIS — R339 Retention of urine, unspecified: Secondary | ICD-10-CM | POA: Diagnosis present

## 2014-03-25 DIAGNOSIS — N183 Chronic kidney disease, stage 3 unspecified: Secondary | ICD-10-CM | POA: Diagnosis present

## 2014-03-25 DIAGNOSIS — R1319 Other dysphagia: Secondary | ICD-10-CM | POA: Diagnosis present

## 2014-03-25 DIAGNOSIS — Z9981 Dependence on supplemental oxygen: Secondary | ICD-10-CM

## 2014-03-25 DIAGNOSIS — J9819 Other pulmonary collapse: Secondary | ICD-10-CM | POA: Diagnosis present

## 2014-03-25 DIAGNOSIS — Z79899 Other long term (current) drug therapy: Secondary | ICD-10-CM | POA: Diagnosis not present

## 2014-03-25 DIAGNOSIS — E7800 Pure hypercholesterolemia, unspecified: Secondary | ICD-10-CM | POA: Diagnosis present

## 2014-03-25 DIAGNOSIS — R1310 Dysphagia, unspecified: Secondary | ICD-10-CM

## 2014-03-25 DIAGNOSIS — E119 Type 2 diabetes mellitus without complications: Secondary | ICD-10-CM | POA: Diagnosis present

## 2014-03-25 DIAGNOSIS — K219 Gastro-esophageal reflux disease without esophagitis: Secondary | ICD-10-CM

## 2014-03-25 DIAGNOSIS — I440 Atrioventricular block, first degree: Secondary | ICD-10-CM | POA: Diagnosis present

## 2014-03-25 DIAGNOSIS — S2611XA Contusion of heart without hemopericardium, initial encounter: Secondary | ICD-10-CM

## 2014-03-25 DIAGNOSIS — E669 Obesity, unspecified: Secondary | ICD-10-CM

## 2014-03-25 DIAGNOSIS — K7682 Hepatic encephalopathy: Secondary | ICD-10-CM | POA: Diagnosis present

## 2014-03-25 DIAGNOSIS — R778 Other specified abnormalities of plasma proteins: Secondary | ICD-10-CM | POA: Diagnosis present

## 2014-03-25 DIAGNOSIS — S2220XG Unspecified fracture of sternum, subsequent encounter for fracture with delayed healing: Secondary | ICD-10-CM

## 2014-03-25 DIAGNOSIS — R5381 Other malaise: Secondary | ICD-10-CM | POA: Diagnosis present

## 2014-03-25 DIAGNOSIS — S2231XA Fracture of one rib, right side, initial encounter for closed fracture: Secondary | ICD-10-CM

## 2014-03-25 DIAGNOSIS — I129 Hypertensive chronic kidney disease with stage 1 through stage 4 chronic kidney disease, or unspecified chronic kidney disease: Secondary | ICD-10-CM | POA: Diagnosis present

## 2014-03-25 DIAGNOSIS — Z6841 Body Mass Index (BMI) 40.0 and over, adult: Secondary | ICD-10-CM | POA: Diagnosis not present

## 2014-03-25 DIAGNOSIS — R296 Repeated falls: Secondary | ICD-10-CM

## 2014-03-25 HISTORY — DX: Obstructive sleep apnea (adult) (pediatric): G47.33

## 2014-03-25 HISTORY — DX: Chronic diastolic (congestive) heart failure: I50.32

## 2014-03-25 HISTORY — DX: Other chest pain: R07.89

## 2014-03-25 HISTORY — DX: Nonrheumatic aortic (valve) stenosis: I35.0

## 2014-03-25 HISTORY — DX: Thrombocytopenia, unspecified: D69.6

## 2014-03-25 LAB — BASIC METABOLIC PANEL
Anion gap: 14 (ref 5–15)
BUN: 84 mg/dL — AB (ref 6–23)
CO2: 19 meq/L (ref 19–32)
CREATININE: 2.78 mg/dL — AB (ref 0.50–1.10)
Calcium: 8.6 mg/dL (ref 8.4–10.5)
Chloride: 97 mEq/L (ref 96–112)
GFR calc Af Amer: 19 mL/min — ABNORMAL LOW (ref >90)
GFR calc non Af Amer: 16 mL/min — ABNORMAL LOW (ref >90)
GLUCOSE: 185 mg/dL — AB (ref 70–99)
Potassium: 6.1 mEq/L — ABNORMAL HIGH (ref 3.7–5.3)
SODIUM: 130 meq/L — AB (ref 137–147)

## 2014-03-25 LAB — URINALYSIS, ROUTINE W REFLEX MICROSCOPIC
GLUCOSE, UA: NEGATIVE mg/dL
Hgb urine dipstick: NEGATIVE
Ketones, ur: NEGATIVE mg/dL
Nitrite: POSITIVE — AB
PH: 5.5 (ref 5.0–8.0)
Protein, ur: NEGATIVE mg/dL
SPECIFIC GRAVITY, URINE: 1.016 (ref 1.005–1.030)
Urobilinogen, UA: 1 mg/dL (ref 0.0–1.0)

## 2014-03-25 LAB — CBC WITH DIFFERENTIAL/PLATELET
Basophils Absolute: 0 10*3/uL (ref 0.0–0.1)
Basophils Relative: 0 % (ref 0–1)
EOS ABS: 0 10*3/uL (ref 0.0–0.7)
EOS PCT: 0 % (ref 0–5)
HCT: 24.5 % — ABNORMAL LOW (ref 36.0–46.0)
HEMOGLOBIN: 8.1 g/dL — AB (ref 12.0–15.0)
LYMPHS ABS: 0.3 10*3/uL — AB (ref 0.7–4.0)
LYMPHS PCT: 4 % — AB (ref 12–46)
MCH: 34.3 pg — AB (ref 26.0–34.0)
MCHC: 33.1 g/dL (ref 30.0–36.0)
MCV: 103.8 fL — AB (ref 78.0–100.0)
MONO ABS: 0.5 10*3/uL (ref 0.1–1.0)
MONOS PCT: 7 % (ref 3–12)
Neutro Abs: 5.7 10*3/uL (ref 1.7–7.7)
Neutrophils Relative %: 89 % — ABNORMAL HIGH (ref 43–77)
Platelets: 80 10*3/uL — ABNORMAL LOW (ref 150–400)
RBC: 2.36 MIL/uL — ABNORMAL LOW (ref 3.87–5.11)
RDW: 17.2 % — ABNORMAL HIGH (ref 11.5–15.5)
WBC: 6.5 10*3/uL (ref 4.0–10.5)

## 2014-03-25 LAB — POTASSIUM: Potassium: 5.8 mEq/L — ABNORMAL HIGH (ref 3.7–5.3)

## 2014-03-25 LAB — URINE MICROSCOPIC-ADD ON

## 2014-03-25 LAB — TROPONIN I: TROPONIN I: 0.55 ng/mL — AB (ref <0.30)

## 2014-03-25 MED ORDER — LORAZEPAM 2 MG/ML IJ SOLN
1.0000 mg | Freq: Once | INTRAMUSCULAR | Status: AC
  Administered 2014-03-25: 1 mg via INTRAVENOUS
  Filled 2014-03-25: qty 1

## 2014-03-25 MED ORDER — SODIUM POLYSTYRENE SULFONATE 15 GM/60ML PO SUSP
30.0000 g | Freq: Once | ORAL | Status: AC
  Administered 2014-03-26: 30 g via ORAL
  Filled 2014-03-25: qty 120

## 2014-03-25 MED ORDER — ONDANSETRON HCL 4 MG/2ML IJ SOLN
4.0000 mg | Freq: Once | INTRAMUSCULAR | Status: AC
  Administered 2014-03-25: 4 mg via INTRAVENOUS
  Filled 2014-03-25: qty 2

## 2014-03-25 MED ORDER — HYDROMORPHONE HCL PF 1 MG/ML IJ SOLN
1.0000 mg | Freq: Once | INTRAMUSCULAR | Status: AC
  Administered 2014-03-25: 1 mg via INTRAVENOUS
  Filled 2014-03-25: qty 1

## 2014-03-25 MED ORDER — SODIUM CHLORIDE 0.9 % IV BOLUS (SEPSIS)
500.0000 mL | Freq: Once | INTRAVENOUS | Status: AC
  Administered 2014-03-25: 500 mL via INTRAVENOUS

## 2014-03-25 MED ORDER — LEVOFLOXACIN IN D5W 750 MG/150ML IV SOLN
750.0000 mg | Freq: Once | INTRAVENOUS | Status: AC
  Administered 2014-03-26: 750 mg via INTRAVENOUS
  Filled 2014-03-25: qty 150

## 2014-03-25 MED ORDER — SODIUM CHLORIDE 0.9 % IV SOLN
INTRAVENOUS | Status: DC
Start: 2014-03-25 — End: 2014-03-31
  Administered 2014-03-25 – 2014-03-29 (×5): via INTRAVENOUS

## 2014-03-25 MED ORDER — HALOPERIDOL LACTATE 5 MG/ML IJ SOLN
2.0000 mg | Freq: Four times a day (QID) | INTRAMUSCULAR | Status: DC | PRN
  Administered 2014-03-26 – 2014-03-28 (×2): 2 mg via INTRAVENOUS
  Filled 2014-03-25 (×2): qty 1

## 2014-03-25 MED ORDER — SODIUM CHLORIDE 0.9 % IV SOLN
1.0000 g | Freq: Once | INTRAVENOUS | Status: AC
  Administered 2014-03-26: 1 g via INTRAVENOUS
  Filled 2014-03-25: qty 10

## 2014-03-25 MED ORDER — LEVOFLOXACIN IN D5W 500 MG/100ML IV SOLN: 500.0000 mg | INTRAVENOUS | Status: DC

## 2014-03-25 MED ORDER — LEVOFLOXACIN IN D5W 750 MG/150ML IV SOLN: 750.0000 mg | INTRAVENOUS | Status: DC

## 2014-03-25 MED ORDER — HYDRALAZINE HCL 20 MG/ML IJ SOLN: 5.0000 mg | Freq: Four times a day (QID) | INTRAMUSCULAR | Status: DC | PRN

## 2014-03-25 MED ORDER — HYDROCOD POLST-CHLORPHEN POLST 10-8 MG/5ML PO LQCR
5.0000 mL | Freq: Once | ORAL | Status: DC
  Filled 2014-03-25: qty 5

## 2014-03-25 MED ORDER — INSULIN ASPART 100 UNIT/ML ~~LOC~~ SOLN
0.0000 [IU] | SUBCUTANEOUS | Status: DC
  Administered 2014-03-26 (×2): 3 [IU] via SUBCUTANEOUS
  Administered 2014-03-26 – 2014-03-27 (×4): 2 [IU] via SUBCUTANEOUS
  Administered 2014-03-27: 3 [IU] via SUBCUTANEOUS
  Administered 2014-03-27: 2 [IU] via SUBCUTANEOUS
  Administered 2014-03-27: 1 [IU] via SUBCUTANEOUS
  Administered 2014-03-27 – 2014-03-28 (×2): 2 [IU] via SUBCUTANEOUS
  Administered 2014-03-28 (×2): 1 [IU] via SUBCUTANEOUS

## 2014-03-25 MED ORDER — MORPHINE SULFATE 2 MG/ML IJ SOLN
1.0000 mg | INTRAMUSCULAR | Status: DC | PRN
  Administered 2014-03-26 – 2014-03-30 (×14): 2 mg via INTRAVENOUS
  Filled 2014-03-25 (×15): qty 1

## 2014-03-25 NOTE — Progress Notes (Signed)
73yo female c/o worsening pain especially w/ inspiration after fall w/ treated sternal fx, UA, abnormal w/ Cx pending, to begin IV ABX.  Will give Levaquin 750mg  IV x1 followed by 500mg  IV Q48H for CrCl ~20 ml/min and monitor Cx and CBC.  Wynona Neat, PharmD, BCPS  03/25/2014 11:24 PM

## 2014-03-25 NOTE — ED Notes (Signed)
The pt is loudly c/o pain med.  The edp has signed up to see her

## 2014-03-25 NOTE — ED Notes (Signed)
Iv started and pain med given and her blood was drawn

## 2014-03-25 NOTE — ED Notes (Addendum)
Pt to ED via [facility] from home.  Pt reports being seen for a fall on 7/28 and dx with sternal fx- pt discharged home with Oxycodone (last dose 12pm)- pt reports having increased pain since going home, lung sounds clear- pain worse with inspiration.  Pt denies any recent falls, on 2 liters O2 at home.

## 2014-03-25 NOTE — ED Notes (Signed)
The pt has continuous moaning with both her eyes closed

## 2014-03-25 NOTE — H&P (Signed)
[facility] History and Physical  [name] EHU:[id number] DOB: 1940/09/08 DOA: 03/25/2014  Referring physician: EDP PCP: Ria Bush, MD   Chief Complaint: brought in by her husband for worsening and persistent chest pain.   HPI: [name] is a 73 y.o. female with prior h/o hypertension, diabetes Mellitus on insulin, chronic diastolic heart failure, obesity, COPD on home oxygen 3 lit/min, major depression with anxiety, psychosis, restless leg syndrome, iron deficiency anemia secondary to chronic blood loss from AVM's, sleep apnea, liver cirrhosis, brought in by her husband for persistent substernal chest pain since 10 days. Patient is confused and delirious and hollering occasionally,. Most of the history available is from the patient 's husband at bedside, who is not a good historian. As per the husband she fell 10 days ago , had a sternal fracture and a few rib fractures. She was seen in ED few days ago and sent home on oxycodone. She was brought back for persistent pain.   On arrival to ED, she was hypotensive, bradycardic, hypoxic on 4 liters of oxygen and confused. Labs revealed hyperkalemia of 6.1 to 5.8, hyponatremia, acute on chronic renal failure, elevated troponin, low platelets of 80,000, and a UTI. EKG revealed new onset atrial fibrillation. She underwent CT chest without contrast revealing non depressed sternal fracture with small associated hematoma, and a non displaced acute fracture of the right lateral  Ninth rib. She was also found to have patchy atelectasis or infiltration in the right middle lobe and left lingula. No pneumothorax.    She was referred to medical service for admission . She will be admitted to step down for further evaluation and management.    Review of Systems:  Could not be obtained.   Past Medical History  Diagnosis Date  . Chronic airway obstruction, not elsewhere classified   . Unspecified chronic bronchitis   . Unspecified  essential hypertension   . Coronary atherosclerosis of unspecified type of vessel, native or graft   . Palpitations   . Aortic valve disorders   . Pure hypercholesterolemia   . Morbid obesity   . Esophageal reflux   . Angiodysplasia of intestine (without mention of hemorrhage)   . Diverticulosis of colon (without mention of hemorrhage)   . Benign neoplasm of colon   . Autoimmune hepatitis   . Cystitis, unspecified   . Osteoarthrosis, unspecified whether generalized or localized, unspecified site   . Osteoporosis, unspecified   . Restless legs syndrome (RLS)   . Major depressive disorder, recurrent episode, severe, specified as with psychotic behavior   . Iron deficiency anemia secondary to blood loss (chronic)   . Coarse tremors     "just in my arms"  . Carpal tunnel syndrome on both sides   . Dysrhythmia     palpitations  . CHF (congestive heart failure)   . Asthma   . Shortness of breath     "any time really"  . Sleep apnea     "but I don't use the equipment"  . Type II diabetes mellitus   . Blood transfusion   . H/O hiatal hernia   . 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 Adenomatous colon polyp   . Anginal pain   . Anxiety   . Pneumonia   . Heart murmur   . AVM (arteriovenous malformation)   . Portal hypertensive gastropathy   . Falls frequently   . UTI (lower urinary tract infection)   . Hiatal hernia   .  GAVE (gastric antral vascular ectasia)    Past Surgical History  Procedure Laterality Date  . Hemorroidectomy    . Appendectomy    . Vaginal hysterectomy    . Breast biopsy      bilateral  . Cesarean section      x 3  . Appendectomy    . Esophagogastroduodenoscopy  06/12/2012    Procedure: ESOPHAGOGASTRODUODENOSCOPY (EGD);  Surgeon: Lafayette Dragon, MD;  Location: [facility];  Service: Endoscopy;  Laterality: N/A;  . Balloon dilation  06/12/2012    Procedure: BALLOON DILATION;  Surgeon: Lafayette Dragon, MD;  Location: [facility];  Service: Endoscopy;   Laterality: N/A;  ?balloon  . Esophagogastroduodenoscopy  09/10/2012    Procedure: ESOPHAGOGASTRODUODENOSCOPY (EGD);  Surgeon: Lafayette Dragon, MD;  Location: [facility];  Service: Endoscopy;  Laterality: N/A;  . Hot hemostasis  09/10/2012    Procedure: HOT HEMOSTASIS (ARGON PLASMA COAGULATION/BICAP);  Surgeon: Lafayette Dragon, MD;  Location: [facility];  Service: Endoscopy;  Laterality: N/A;  . Esophagogastroduodenoscopy N/A 01/18/2013    Procedure: ESOPHAGOGASTRODUODENOSCOPY (EGD);  Surgeon: Lafayette Dragon, MD;  Location: [facility];  Service: Endoscopy;  Laterality: N/A;  . Esophagogastroduodenoscopy N/A 05/24/2013    Procedure: ESOPHAGOGASTRODUODENOSCOPY (EGD);  Surgeon: Jerene Bears, MD;  Location: [facility];  Service: Gastroenterology;  Laterality: N/A;  . Hot hemostasis N/A 05/24/2013    Procedure: HOT HEMOSTASIS (ARGON PLASMA COAGULATION/BICAP);  Surgeon: Jerene Bears, MD;  Location: [facility];  Service: Gastroenterology;  Laterality: N/A;   Social History:  reports that she quit smoking about 21 years ago. Her smoking use included Cigarettes. She has a 37.5 pack-year smoking history. She has never used smokeless tobacco. She reports that she does not drink alcohol or use illicit drugs.  Allergies  Allergen Reactions  . Aspirin Other (See Comments)    Causes nosebleeds  . Penicillins Itching  . Theophylline Nausea And Vomiting    Family History  Problem Relation Age of Onset  . Heart disease Mother   . Cervical cancer Mother   . Kidney disease Mother   . Diabetes Mother   . Breast cancer Sister   . Multiple sclerosis Sister   . Colon cancer Neg Hx   . Breast cancer Maternal Aunt     x 2  . Diabetes Father   . Diabetes Sister   . Multiple sclerosis Other      Prior to Admission medications   Medication Sig Start Date End Date Taking? Authorizing Provider  albuterol (PROAIR HFA) 108 (90 BASE) MCG/ACT inhaler Inhale 2 puffs into the lungs every 6 (six) hours as needed  for wheezing or shortness of breath.    Yes Historical Provider, MD  ALPRAZolam Duanne Moron) 1 MG tablet Take 1 mg by mouth 3 (three) times daily as needed for anxiety.    Yes Historical Provider, MD  b complex vitamins tablet Take 1 tablet by mouth daily.    Yes Historical Provider, MD  buPROPion (WELLBUTRIN XL) 150 MG 24 hr tablet Take 150 mg by mouth daily.   Yes Historical Provider, MD  desvenlafaxine (PRISTIQ) 100 MG 24 hr tablet Take 100 mg by mouth daily. Takes with a 50mg  tablet, total daily dose is 150mg    Yes Historical Provider, MD  desvenlafaxine (PRISTIQ) 50 MG 24 hr tablet Take 50 mg by mouth daily. Takes with a 100mg  tablet, total daily dose is 150mg    Yes Historical Provider, MD  Fluticasone-Salmeterol (ADVAIR) 250-50 MCG/DOSE AEPB Inhale 1 puff into the lungs  2 (two) times daily.   Yes Historical Provider, MD  furosemide (LASIX) 40 MG tablet Take 40-60 mg by mouth 2 (two) times daily. Take 1 1/2 tablets (60 mg) in the morning and 1 tablet in the afternoon 12/08/13  Yes Sherren Mocha, MD  guaiFENesin (MUCINEX) 600 MG 12 hr tablet Take 600 mg by mouth 2 (two) times daily. 2 tablet by mouth once daily   Yes Historical Provider, MD  Insulin Glargine (LANTUS SOLOSTAR [place]) Inject 140 Units into the skin daily before supper.    Yes Historical Provider, MD  insulin lispro (HUMALOG) 100 UNIT/ML KiwkPen Inject 0-25 Units into the skin daily with supper.  07/15/13  Yes Renato Shin, MD  lactulose, encephalopathy, (CHRONULAC) 10 GM/15ML SOLN Take 20 g by mouth daily. constipation 12/06/13  Yes Historical Provider, MD  levalbuterol (XOPENEX) 0.63 MG/3ML nebulizer solution Take 0.63 mg by nebulization every 4 (four) hours as needed for wheezing or shortness of breath.   Yes Historical Provider, MD  lisinopril (PRINIVIL,ZESTRIL) 5 MG tablet Take 5 mg by mouth at bedtime.    Yes Historical Provider, MD  Multiple Vitamins-Minerals (CENTRUM SILVER PO) Take 1 tablet by mouth daily.    Yes Historical Provider,  MD  nadolol (CORGARD) 20 MG tablet Take 10 mg by mouth daily.   Yes Historical Provider, MD  oxyCODONE (OXY IR/ROXICODONE) 5 MG immediate release tablet Take 5 mg by mouth every 4 (four) hours as needed for moderate pain. prn 11/30/13  Yes Historical Provider, MD  pantoprazole (PROTONIX) 40 MG tablet Take 40 mg by mouth daily.  05/31/13  Yes Noralee Space, MD  promethazine (PHENERGAN) 25 MG tablet Take 1 tablet (25 mg total) by mouth 2 (two) times daily as needed for nausea. 10/13/13  Yes Noralee Space, MD  QUEtiapine (SEROQUEL) 400 MG tablet Take 400 mg by mouth at bedtime.   Yes Historical Provider, MD  rifaximin (XIFAXAN) 550 MG TABS tablet Take 1 tablet (550 mg total) by mouth daily. 02/11/14  Yes Lafayette Dragon, MD  rOPINIRole (REQUIP) 4 MG tablet Take 4 mg by mouth at bedtime.   Yes Historical Provider, MD  Saline (OCEAN NASAL SPRAY NA) Place 1 spray into the nose 3 (three) times daily as needed (congestion).    Yes Historical Provider, MD  simvastatin (ZOCOR) 40 MG tablet Take 40 mg by mouth daily.   Yes Historical Provider, MD  spironolactone (ALDACTONE) 12.5 mg TABS tablet Take 0.5 tablets (12.5 mg total) by mouth daily. 11/23/13  Yes Sherren Mocha, MD  Vitamin D, Ergocalciferol, (DRISDOL) 50000 UNITS CAPS capsule Take 50,000 Units by mouth every 7 (seven) days. On Fridays   Yes Historical Provider, MD   Physical Exam: Filed Vitals:   03/25/14 2248 03/25/14 2254 03/25/14 2300 03/26/14 0000  BP: 86/65 102/67 98/45 132/99  Pulse: 52 57 54 58  Temp:      TempSrc:      Resp:   19   Height:   5' 4.17" (1.63 m)   Weight:   105.2 kg (231 lb 14.8 oz)   SpO2: 91% 92% 94% 91%    Wt Readings from Last 3 Encounters:  03/25/14 105.2 kg (231 lb 14.8 oz)  03/22/14 105.235 kg (232 lb)  03/09/14 103.874 kg (229 lb)    General:  In mild distress, confused, delirious,  Eyes: PERRL, normal lids, irises & conjunctiva Neck: no LAD, masses or thyromegaly Cardiovascular:irregular , no m/r/g. No LE  edema. Telemetry: atrial fibrillation Respiratory: diminished air entry  at bases, wheezing heard.  Abdomen: soft, ntnd Skin: bruise over the right leg.  Musculoskeletal: tenderness over the sternum and chest wall.  Neurologic: delirious, confused, able to move all extremities, no facial droop. Could not assess any further.           Labs on Admission:  Basic Metabolic Panel:  Recent Labs Lab 03/20/14 2239 03/25/14 1927 03/25/14 2115  NA 139 130*  --   K 5.1 6.1* 5.8*  CL 102 97  --   CO2 25 19  --   GLUCOSE 167* 185*  --   BUN 71* 84*  --   CREATININE 1.79* 2.78*  --   CALCIUM 9.1 8.6  --    Liver Function Tests: No results found for this basename: AST, ALT, ALKPHOS, BILITOT, PROT, ALBUMIN,  in the last 168 hours No results found for this basename: LIPASE, AMYLASE,  in the last 168 hours No results found for this basename: AMMONIA,  in the last 168 hours CBC:  Recent Labs Lab 03/20/14 2239 03/21/14 1029 03/25/14 1927  WBC 3.4*  --  6.5  NEUTROABS  --   --  5.7  HGB 8.3* 8.1* 8.1*  HCT 24.7*  --  24.5*  MCV 101.2*  --  103.8*  PLT 74*  --  80*   Cardiac Enzymes:  Recent Labs Lab 03/25/14 1927  TROPONINI 0.55*    BNP (last 3 results)  Recent Labs  09/17/13 1713 10/03/13 0045 01/19/14 1217  PROBNP 147.0* 207.5* 85.0   CBG: No results found for this basename: GLUCAP,  in the last 168 hours  Radiological Exams on Admission: Ct Chest Wo Contrast  03/25/2014   CLINICAL DATA:  Patient fell on 07/28 and was diagnosed with a sternal fracture. Patient is having increased pain since then.  EXAM: CT CHEST WITHOUT CONTRAST  TECHNIQUE: Multidetector CT imaging of the chest was performed following the standard protocol without IV contrast.  COMPARISON:  Sternum 03/22/2014  FINDINGS: The study is technically limited due to motion artifact and streak artifact arising from the patient's hands crossed across the chest. Focal cortical irregularity is suggested in the mid  sternum corresponding to nondisplaced fracture on prior plain films. Visualization of this area is limited due to artifact. There is minimal retrosternal soft tissue hematoma without significant fluid collection in the anterior mediastinum. A few prominent lymph nodes are demonstrated in the anterior mediastinum, likely reactive.  Cardiac enlargement. Coronary artery calcifications. Normal caliber thoracic aorta with calcification. Pulmonary vascularity is somewhat prominent and congestive changes are not excluded. Esophagus is decompressed. But no pleural effusions. Evaluation of lungs is limited due to respiratory motion artifact. There appears to be atelectasis in the lung bases particularly on the right. Patchy atelectasis or infiltration in the right middle lobe and left lingula. No focal consolidation or collapse. No pneumothorax. Visualized portions of the upper abdominal organs demonstrate incomplete visualization of the liver and spleen but there appears to be a cirrhotic configuration to the liver and the spleen appears enlarged. Normal alignment of the thoracic vertebrae with degenerative changes present. Acute appearing fracture of the right lateral ninth rib. Additional old right rib fractures with callus formation are also demonstrated. No displaced fractures are identified.  IMPRESSION: Non depressed sternal fracture with small associated hematoma. Aorta is normal in caliber and no abnormal mediastinal fluid collections are suggested. Nondisplaced acute fracture of the right lateral ninth rib. Additional old right rib fractures. Patchy atelectasis or infiltration in both lungs. Technically limited study  due to motion artifact.   Electronically Signed   By: Lucienne Capers M.D.   On: 03/25/2014 21:18    EKG:  afib with rate 55/min.   Assessment/Plan Active Problems:   IRON DEFICIENCY ANEMIA SECONDARY TO BLOOD LOSS   CAD   COPD   Chronic diastolic CHF (congestive heart failure)   Physical  deconditioning   Cirrhosis of liver without mention of alcohol   Acute kidney injury   AKI (acute kidney injury)   Hyperkalemia   Elevated troponin   Hyponatremia   UTI (lower urinary tract infection)  Chest wall Pain: Possibly from the sternal fracture from the fall.  Pain control.   Acute on CKD stage 3: - possibly from dehydration and decreased po intake.  - hydration and repeat renal parameters in am.  - urine electrolytes ordered.   Acute encephalopathy: Unclear etiology. Possibly metabolic from the renal failure and hyponatremia and hyperkalemia vs hepatic encephalopathy. - liver panel, ammonia level, TSH.  - haldol for agitation.   Hyponatremia and hyperkalemia: - possibly from dehydyration.  - stop spironolactone and lasix - one does of kayexalate , calcium gluconate, ordered.  - albuterol neb treatment given.   Anemia and thrombocytopenia: - chronic. The counts appear to be at baseline. Anemia panel ordered. Avoid anticoagulation. On SCD'S  Elevated troponin and new onset Atrial fibrillation: Monitor on telemetry. Serial troponin's and 2 EKG's showing atrial fibrillation.  Last office note less than a month ago, revealed she is not a candidate for invasive procedures because of her h/o GI bleed.  Will get echocardiogram, as her last echo is from 2014. Get cardiology consultation.    Acute respiratory failure with hypoxia on 4 liters of oxygen probably COPD exacerbation and questionable CAP,  ABG, one dose of solumedrol and nebulizer treatments as needed.  IV levaquin to cover both CAP and UTI.  Resume symbicort.    Urinary tract infection: - started on IV levaquin.  - urine cultures sent.  - foley placed as she had urinary retention, had a in and out cath for sample.    Diabetes Mellitus;  Her last hgba1c is 5.3 two days ago. As per the pharmacy and the husband at bedside, she is on 140 units of lantus and 25 units of Humalog three times a day.  Will  decrease the dose to 80 units of Lantus and SSI.    Hypotension; resolved with fluids.  Holding all bp medications and lasix, spironolactone.    Chronic diastolic heart failure: - appears to be compensated. No fluid overload. No pedal edema. probnp will be ordered.    ? Hepatic encephalopathy/ liver cirrhosis and splenomegaly: - evaluate for ascites.  - resume lactulose and rifaximin.  - ammonia level  Code Status: full code , confirmed with her husband at bedside, but he said she has a living will and doesn't know what's in the will and will get a copy of it in the morning.  DVT Prophylaxis: SCD'S Family Communication:  Husband at bedside Disposition Plan: admitted to step down.   Time spent: 95 minutes.   [facility] Pager [phone]  **Disclaimer: This note may have been dictated with voice recognition software. Similar sounding words can inadvertently be transcribed and this note may contain transcription errors which may not have been corrected upon publication of note.**

## 2014-03-25 NOTE — ED Notes (Signed)
Pt yelling loudly about pain.  More pain med given

## 2014-03-25 NOTE — ED Notes (Signed)
Pt went to c-t then returned

## 2014-03-25 NOTE — ED Provider Notes (Signed)
CSN: [id number]     Arrival date & time 03/25/14  1739 History   First MD Initiated Contact with Patient 03/25/14 1848     Chief Complaint  Patient presents with  . Chest Pain     (Consider location/radiation/quality/duration/timing/severity/associated sxs/prior Treatment) HPI  [name] is a 73 y.o. female who presents for evaluation of chest discomfort, which started after a fall 10 days ago. She saw her doctor for the same 3 days ago and was diagnosed with a sternal fracture. She was given oral narcotics, which have not helped her pain. She denies chest pain, shortness of breath, weakness, or dizziness. There's been no fever or chills, or change in bowel and urinary habits. There are no other known modifying factors.   Past Medical History  Diagnosis Date  . Chronic airway obstruction, not elsewhere classified   . Unspecified chronic bronchitis   . Unspecified essential hypertension   . Coronary atherosclerosis of unspecified type of vessel, native or graft   . Palpitations   . Aortic valve disorders   . Pure hypercholesterolemia   . Morbid obesity   . Esophageal reflux   . Angiodysplasia of intestine (without mention of hemorrhage)   . Diverticulosis of colon (without mention of hemorrhage)   . Benign neoplasm of colon   . Autoimmune hepatitis   . Cystitis, unspecified   . Osteoarthrosis, unspecified whether generalized or localized, unspecified site   . Osteoporosis, unspecified   . Restless legs syndrome (RLS)   . Major depressive disorder, recurrent episode, severe, specified as with psychotic behavior   . Iron deficiency anemia secondary to blood loss (chronic)   . Coarse tremors     "just in my arms"  . Carpal tunnel syndrome on both sides   . Dysrhythmia     palpitations  . CHF (congestive heart failure)   . Asthma   . Shortness of breath     "any time really"  . Sleep apnea     "but I don't use the equipment"  . Type II diabetes mellitus   . Blood  transfusion   . H/O hiatal hernia   . 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 Adenomatous colon polyp   . Anginal pain   . Anxiety   . Pneumonia   . Heart murmur   . AVM (arteriovenous malformation)   . Portal hypertensive gastropathy   . Falls frequently   . UTI (lower urinary tract infection)   . Hiatal hernia   . GAVE (gastric antral vascular ectasia)    Past Surgical History  Procedure Laterality Date  . Hemorroidectomy    . Appendectomy    . Vaginal hysterectomy    . Breast biopsy      bilateral  . Cesarean section      x 3  . Appendectomy    . Esophagogastroduodenoscopy  06/12/2012    Procedure: ESOPHAGOGASTRODUODENOSCOPY (EGD);  Surgeon: Lafayette Dragon, MD;  Location: [facility];  Service: Endoscopy;  Laterality: N/A;  . Balloon dilation  06/12/2012    Procedure: BALLOON DILATION;  Surgeon: Lafayette Dragon, MD;  Location: [facility];  Service: Endoscopy;  Laterality: N/A;  ?balloon  . Esophagogastroduodenoscopy  09/10/2012    Procedure: ESOPHAGOGASTRODUODENOSCOPY (EGD);  Surgeon: Lafayette Dragon, MD;  Location: [facility];  Service: Endoscopy;  Laterality: N/A;  . Hot hemostasis  09/10/2012    Procedure: HOT HEMOSTASIS (ARGON PLASMA COAGULATION/BICAP);  Surgeon: Lafayette Dragon, MD;  Location: [facility];  Service:  Endoscopy;  Laterality: N/A;  . Esophagogastroduodenoscopy N/A 01/18/2013    Procedure: ESOPHAGOGASTRODUODENOSCOPY (EGD);  Surgeon: Lafayette Dragon, MD;  Location: [facility] ENDOSCOPY;  Service: Endoscopy;  Laterality: N/A;  . Esophagogastroduodenoscopy N/A 05/24/2013    Procedure: ESOPHAGOGASTRODUODENOSCOPY (EGD);  Surgeon: Jerene Bears, MD;  Location: [place];  Service: Gastroenterology;  Laterality: N/A;  . Hot hemostasis N/A 05/24/2013    Procedure: HOT HEMOSTASIS (ARGON PLASMA COAGULATION/BICAP);  Surgeon: Jerene Bears, MD;  Location: [place];  Service: Gastroenterology;  Laterality: N/A;   Family History  Problem Relation Age of Onset  . Heart disease  Mother   . Cervical cancer Mother   . Kidney disease Mother   . Diabetes Mother   . Breast cancer Sister   . Multiple sclerosis Sister   . Colon cancer Neg Hx   . Breast cancer Maternal Aunt     x 2  . Diabetes Father   . Diabetes Sister   . Multiple sclerosis Other    History  Substance Use Topics  . Smoking status: Former Smoker -- 1.50 packs/day for 25 years    Types: Cigarettes    Quit date: 08/26/1992  . Smokeless tobacco: Never Used  . Alcohol Use: No     Comment: 8/36/62 "last alcoholic drink was years ago"   OB History   Grav Para Term Preterm Abortions TAB SAB Ect Mult Living                 Review of Systems  All other systems reviewed and are negative.     Allergies  Aspirin; Penicillins; and Theophylline  Home Medications   Prior to Admission medications   Medication Sig Start Date End Date Taking? Authorizing Provider  albuterol (PROAIR HFA) 108 (90 BASE) MCG/ACT inhaler Inhale 2 puffs into the lungs every 6 (six) hours as needed for wheezing or shortness of breath.    Yes Historical Provider, MD  ALPRAZolam Duanne Moron) 1 MG tablet Take 1 mg by mouth 3 (three) times daily as needed for anxiety.    Yes Historical Provider, MD  b complex vitamins tablet Take 1 tablet by mouth daily.    Yes Historical Provider, MD  buPROPion (WELLBUTRIN XL) 150 MG 24 hr tablet Take 150 mg by mouth daily.   Yes Historical Provider, MD  desvenlafaxine (PRISTIQ) 100 MG 24 hr tablet Take 100 mg by mouth daily. Takes with a 50mg  tablet, total daily dose is 150mg    Yes Historical Provider, MD  desvenlafaxine (PRISTIQ) 50 MG 24 hr tablet Take 50 mg by mouth daily. Takes with a 100mg  tablet, total daily dose is 150mg    Yes Historical Provider, MD  Fluticasone-Salmeterol (ADVAIR) 250-50 MCG/DOSE AEPB Inhale 1 puff into the lungs 2 (two) times daily.   Yes Historical Provider, MD  furosemide (LASIX) 40 MG tablet Take 40-60 mg by mouth 2 (two) times daily. Take 1 1/2 tablets (60 mg) in the  morning and 1 tablet in the afternoon 12/08/13  Yes Sherren Mocha, MD  guaiFENesin (MUCINEX) 600 MG 12 hr tablet Take 600 mg by mouth 2 (two) times daily. 2 tablet by mouth once daily   Yes Historical Provider, MD  Insulin Glargine (LANTUS SOLOSTAR ) Inject 140 Units into the skin daily before supper.    Yes Historical Provider, MD  insulin lispro (HUMALOG) 100 UNIT/ML KiwkPen Inject 0-25 Units into the skin daily with supper.  07/15/13  Yes Renato Shin, MD  lactulose, encephalopathy, (CHRONULAC) 10 GM/15ML SOLN Take 20 g by mouth  daily. constipation 12/06/13  Yes Historical Provider, MD  levalbuterol (XOPENEX) 0.63 MG/3ML nebulizer solution Take 0.63 mg by nebulization every 4 (four) hours as needed for wheezing or shortness of breath.   Yes Historical Provider, MD  lisinopril (PRINIVIL,ZESTRIL) 5 MG tablet Take 5 mg by mouth at bedtime.    Yes Historical Provider, MD  Multiple Vitamins-Minerals (CENTRUM SILVER PO) Take 1 tablet by mouth daily.    Yes Historical Provider, MD  nadolol (CORGARD) 20 MG tablet Take 10 mg by mouth daily.   Yes Historical Provider, MD  oxyCODONE (OXY IR/ROXICODONE) 5 MG immediate release tablet Take 5 mg by mouth every 4 (four) hours as needed for moderate pain. prn 11/30/13  Yes Historical Provider, MD  pantoprazole (PROTONIX) 40 MG tablet Take 40 mg by mouth daily.  05/31/13  Yes Noralee Space, MD  promethazine (PHENERGAN) 25 MG tablet Take 1 tablet (25 mg total) by mouth 2 (two) times daily as needed for nausea. 10/13/13  Yes Noralee Space, MD  QUEtiapine (SEROQUEL) 400 MG tablet Take 400 mg by mouth at bedtime.   Yes Historical Provider, MD  rifaximin (XIFAXAN) 550 MG TABS tablet Take 1 tablet (550 mg total) by mouth daily. 02/11/14  Yes Lafayette Dragon, MD  rOPINIRole (REQUIP) 4 MG tablet Take 4 mg by mouth at bedtime.   Yes Historical Provider, MD  Saline (OCEAN NASAL SPRAY NA) Place 1 spray into the nose 3 (three) times daily as needed (congestion).    Yes Historical  Provider, MD  simvastatin (ZOCOR) 40 MG tablet Take 40 mg by mouth daily.   Yes Historical Provider, MD  spironolactone (ALDACTONE) 12.5 mg TABS tablet Take 0.5 tablets (12.5 mg total) by mouth daily. 11/23/13  Yes Sherren Mocha, MD  Vitamin D, Ergocalciferol, (DRISDOL) 50000 UNITS CAPS capsule Take 50,000 Units by mouth every 7 (seven) days. On Fridays   Yes Historical Provider, MD   BP 98/33  Pulse 55  Temp(Src) 99 F (37.2 C) (Oral)  Resp 22  SpO2 93% Physical Exam  Nursing note and vitals reviewed. Constitutional: She is oriented to person, place, and time. She appears well-developed. She appears distressed (She is writhing in bed, and continuously calling out in pain).  Obese, appears older than stated age.  HENT:  Head: Normocephalic and atraumatic.  Mucous membranes are dry  Eyes: Conjunctivae and EOM are normal. Pupils are equal, round, and reactive to light.  Neck: Normal range of motion and phonation normal. Neck supple.  Cardiovascular: Normal rate, regular rhythm and intact distal pulses.   Pulmonary/Chest: Effort normal and breath sounds normal. She exhibits tenderness (diffuse anterior chest wall discomfort, with ecchymosis of the inferior center chest, consistent with her recent subacute trauma).  Abdominal: Soft. She exhibits no distension. There is no tenderness. There is no guarding.  Musculoskeletal: Normal range of motion.  Neurological: She is alert and oriented to person, place, and time. She exhibits normal muscle tone.  Skin: Skin is warm and dry.  Psychiatric: Her behavior is normal. Judgment and thought content normal.  Anxious    ED Course  Procedures (including critical care time)  Medications  0.9 %  sodium chloride infusion ( Intravenous New Bag/Given 03/25/14 2146)  sodium chloride 0.9 % bolus 500 mL (0 mLs Intravenous Stopped 03/25/14 2103)  LORazepam (ATIVAN) injection 1 mg (1 mg Intravenous Given 03/25/14 1955)  HYDROmorphone (DILAUDID) injection 1  mg (1 mg Intravenous Given 03/25/14 1952)  ondansetron (ZOFRAN) injection 4 mg (4 mg Intravenous Given  03/25/14 1950)  HYDROmorphone (DILAUDID) injection 1 mg (1 mg Intravenous Given 03/25/14 2248)    Patient Vitals for the past 24 hrs:  BP Temp Temp src Pulse Resp SpO2  03/25/14 2254 102/67 mmHg - - 57 - 92 %  03/25/14 2248 86/65 mmHg - - 52 - 91 %  03/25/14 2217 98/33 mmHg - - 55 22 93 %  03/25/14 2045 107/48 mmHg - - 57 17 92 %  03/25/14 2015 108/52 mmHg - - 56 19 93 %  03/25/14 1913 107/68 mmHg - - 66 - 99 %  03/25/14 1845 101/37 mmHg - - 61 26 95 %  03/25/14 1758 109/87 mmHg 99 F (37.2 C) Oral 62 20 94 %      Labs Review Labs Reviewed  CBC WITH DIFFERENTIAL - Abnormal; Notable for the following:    RBC 2.36 (*)    Hemoglobin 8.1 (*)    HCT 24.5 (*)    MCV 103.8 (*)    MCH 34.3 (*)    RDW 17.2 (*)    Platelets 80 (*)    Neutrophils Relative % 89 (*)    Lymphocytes Relative 4 (*)    Lymphs Abs 0.3 (*)    All other components within normal limits  BASIC METABOLIC PANEL - Abnormal; Notable for the following:    Sodium 130 (*)    Potassium 6.1 (*)    Glucose, Bld 185 (*)    BUN 84 (*)    Creatinine, Ser 2.78 (*)    GFR calc non Af Amer 16 (*)    GFR calc Af Amer 19 (*)    All other components within normal limits  TROPONIN I - Abnormal; Notable for the following:    Troponin I 0.55 (*)    All other components within normal limits  URINALYSIS, ROUTINE W REFLEX MICROSCOPIC - Abnormal; Notable for the following:    Color, Urine AMBER (*)    APPearance CLOUDY (*)    Bilirubin Urine SMALL (*)    Nitrite POSITIVE (*)    Leukocytes, UA MODERATE (*)    All other components within normal limits  POTASSIUM - Abnormal; Notable for the following:    Potassium 5.8 (*)    All other components within normal limits  URINE MICROSCOPIC-ADD ON - Abnormal; Notable for the following:    Bacteria, UA MANY (*)    All other components within normal limits  URINE CULTURE    Imaging  Review Ct Chest Wo Contrast  03/25/2014   CLINICAL DATA:  Patient fell on 07/28 and was diagnosed with a sternal fracture. Patient is having increased pain since then.  EXAM: CT CHEST WITHOUT CONTRAST  TECHNIQUE: Multidetector CT imaging of the chest was performed following the standard protocol without IV contrast.  COMPARISON:  Sternum 03/22/2014  FINDINGS: The study is technically limited due to motion artifact and streak artifact arising from the patient's hands crossed across the chest. Focal cortical irregularity is suggested in the mid sternum corresponding to nondisplaced fracture on prior plain films. Visualization of this area is limited due to artifact. There is minimal retrosternal soft tissue hematoma without significant fluid collection in the anterior mediastinum. A few prominent lymph nodes are demonstrated in the anterior mediastinum, likely reactive.  Cardiac enlargement. Coronary artery calcifications. Normal caliber thoracic aorta with calcification. Pulmonary vascularity is somewhat prominent and congestive changes are not excluded. Esophagus is decompressed. But no pleural effusions. Evaluation of lungs is limited due to respiratory motion artifact. There appears to be  atelectasis in the lung bases particularly on the right. Patchy atelectasis or infiltration in the right middle lobe and left lingula. No focal consolidation or collapse. No pneumothorax. Visualized portions of the upper abdominal organs demonstrate incomplete visualization of the liver and spleen but there appears to be a cirrhotic configuration to the liver and the spleen appears enlarged. Normal alignment of the thoracic vertebrae with degenerative changes present. Acute appearing fracture of the right lateral ninth rib. Additional old right rib fractures with callus formation are also demonstrated. No displaced fractures are identified.  IMPRESSION: Non depressed sternal fracture with small associated hematoma. Aorta is  normal in caliber and no abnormal mediastinal fluid collections are suggested. Nondisplaced acute fracture of the right lateral ninth rib. Additional old right rib fractures. Patchy atelectasis or infiltration in both lungs. Technically limited study due to motion artifact.   Electronically Signed   By: Lucienne Capers M.D.   On: 03/25/2014 21:18     EKG Interpretation   Date/Time:  Friday March 25 2014 17:50:28 EDT Ventricular Rate:  62 PR Interval:    QRS Duration: 115 QT Interval:  413 QTC Calculation: 419 R Axis:   -30 Text Interpretation:  Atrial fibrillation Nonspecific intraventricular  conduction delay since last tracing no significant change Confirmed by  Eulis Foster  MD, Vira Agar ([id number]) on 03/25/2014 9:10:40 PM      MDM   Final diagnoses:  Sternal fracture, with delayed healing, subsequent encounter  Chest wall pain  Right rib fracture, closed, initial encounter  AKI (acute kidney injury)    Chest wall pain, secondary to rib fracture and sternal fracture. Acute kidney injury related to poor oral intake. Slight elevation of potassium, related to dehydration. Mild troponin elevation is likely secondary to subacute cardiac contusion. Doubt ACS, PE, or pneumonia. Soft, blood pressure related to decreased intravascular volume. Possible new onset atrial fibrillation  Plan: Admit  Richarda Blade, MD 03/26/14 [phone]

## 2014-03-25 NOTE — ED Notes (Signed)
The pt reports that her pain has not stopped all day

## 2014-03-25 NOTE — ED Notes (Signed)
Admitting doctor here to see.  Her bp has increased for the past 30 minutes ?? reason

## 2014-03-25 NOTE — ED Notes (Signed)
Pt sleeping roused up her pain is better

## 2014-03-26 ENCOUNTER — Inpatient Hospital Stay (HOSPITAL_COMMUNITY): Payer: Medicare Other

## 2014-03-26 ENCOUNTER — Encounter (HOSPITAL_COMMUNITY): Payer: Self-pay | Admitting: Radiology

## 2014-03-26 DIAGNOSIS — IMO0001 Reserved for inherently not codable concepts without codable children: Secondary | ICD-10-CM

## 2014-03-26 DIAGNOSIS — R7989 Other specified abnormal findings of blood chemistry: Secondary | ICD-10-CM

## 2014-03-26 DIAGNOSIS — J449 Chronic obstructive pulmonary disease, unspecified: Secondary | ICD-10-CM

## 2014-03-26 DIAGNOSIS — R0902 Hypoxemia: Secondary | ICD-10-CM

## 2014-03-26 DIAGNOSIS — N179 Acute kidney failure, unspecified: Secondary | ICD-10-CM

## 2014-03-26 DIAGNOSIS — I359 Nonrheumatic aortic valve disorder, unspecified: Secondary | ICD-10-CM

## 2014-03-26 DIAGNOSIS — K7460 Unspecified cirrhosis of liver: Secondary | ICD-10-CM

## 2014-03-26 DIAGNOSIS — J9610 Chronic respiratory failure, unspecified whether with hypoxia or hypercapnia: Secondary | ICD-10-CM

## 2014-03-26 DIAGNOSIS — N390 Urinary tract infection, site not specified: Secondary | ICD-10-CM

## 2014-03-26 DIAGNOSIS — E875 Hyperkalemia: Secondary | ICD-10-CM

## 2014-03-26 DIAGNOSIS — I5032 Chronic diastolic (congestive) heart failure: Secondary | ICD-10-CM

## 2014-03-26 DIAGNOSIS — G9340 Encephalopathy, unspecified: Secondary | ICD-10-CM

## 2014-03-26 DIAGNOSIS — I509 Heart failure, unspecified: Secondary | ICD-10-CM

## 2014-03-26 LAB — BASIC METABOLIC PANEL
Anion gap: 12 (ref 5–15)
Anion gap: 14 (ref 5–15)
BUN: 86 mg/dL — ABNORMAL HIGH (ref 6–23)
BUN: 81 mg/dL — AB (ref 6–23)
CALCIUM: 8.5 mg/dL (ref 8.4–10.5)
CO2: 22 meq/L (ref 19–32)
CO2: 20 mEq/L (ref 19–32)
Calcium: 8.7 mg/dL (ref 8.4–10.5)
Chloride: 99 mEq/L (ref 96–112)
Chloride: 100 mEq/L (ref 96–112)
Creatinine, Ser: 2.96 mg/dL — ABNORMAL HIGH (ref 0.50–1.10)
Creatinine, Ser: 2.6 mg/dL — ABNORMAL HIGH (ref 0.50–1.10)
GFR calc Af Amer: 17 mL/min — ABNORMAL LOW (ref >90)
GFR calc non Af Amer: 15 mL/min — ABNORMAL LOW (ref >90)
GFR, EST AFRICAN AMERICAN: 20 mL/min — AB (ref >90)
GFR, EST NON AFRICAN AMERICAN: 18 mL/min — AB (ref >90)
GLUCOSE: 143 mg/dL — AB (ref 70–99)
GLUCOSE: 195 mg/dL — AB (ref 70–99)
Potassium: 6 mEq/L — ABNORMAL HIGH (ref 3.7–5.3)
Potassium: 5.7 mEq/L — ABNORMAL HIGH (ref 3.7–5.3)
SODIUM: 134 meq/L — AB (ref 137–147)
Sodium: 133 mEq/L — ABNORMAL LOW (ref 137–147)

## 2014-03-26 LAB — GLUCOSE, CAPILLARY
GLUCOSE-CAPILLARY: 162 mg/dL — AB (ref 70–99)
GLUCOSE-CAPILLARY: 224 mg/dL — AB (ref 70–99)
GLUCOSE-CAPILLARY: 229 mg/dL — AB (ref 70–99)
Glucose-Capillary: 151 mg/dL — ABNORMAL HIGH (ref 70–99)
Glucose-Capillary: 196 mg/dL — ABNORMAL HIGH (ref 70–99)

## 2014-03-26 LAB — AMMONIA: Ammonia: 62 umol/L — ABNORMAL HIGH (ref 11–60)

## 2014-03-26 LAB — CBC
HCT: 23.4 % — ABNORMAL LOW (ref 36.0–46.0)
HEMOGLOBIN: 7.5 g/dL — AB (ref 12.0–15.0)
MCH: 33 pg (ref 26.0–34.0)
MCHC: 32.1 g/dL (ref 30.0–36.0)
MCV: 103.1 fL — ABNORMAL HIGH (ref 78.0–100.0)
Platelets: 83 10*3/uL — ABNORMAL LOW (ref 150–400)
RBC: 2.27 MIL/uL — AB (ref 3.87–5.11)
RDW: 17.3 % — ABNORMAL HIGH (ref 11.5–15.5)
WBC: 8.2 10*3/uL (ref 4.0–10.5)

## 2014-03-26 LAB — HEPATIC FUNCTION PANEL
ALT: 29 U/L (ref 0–35)
AST: 69 U/L — ABNORMAL HIGH (ref 0–37)
Albumin: 3.1 g/dL — ABNORMAL LOW (ref 3.5–5.2)
Alkaline Phosphatase: 101 U/L (ref 39–117)
Bilirubin, Direct: 1.3 mg/dL — ABNORMAL HIGH (ref 0.0–0.3)
Indirect Bilirubin: 1.1 mg/dL — ABNORMAL HIGH (ref 0.3–0.9)
Total Bilirubin: 2.4 mg/dL — ABNORMAL HIGH (ref 0.3–1.2)
Total Protein: 6.3 g/dL (ref 6.0–8.3)

## 2014-03-26 LAB — PRO B NATRIURETIC PEPTIDE: Pro B Natriuretic peptide (BNP): 3352 pg/mL — ABNORMAL HIGH (ref 0–125)

## 2014-03-26 LAB — IRON AND TIBC
Iron: 24 ug/dL — ABNORMAL LOW (ref 42–135)
Saturation Ratios: 8 % — ABNORMAL LOW (ref 20–55)
TIBC: 311 ug/dL (ref 250–470)
UIBC: 287 ug/dL (ref 125–400)

## 2014-03-26 LAB — MRSA PCR SCREENING: MRSA by PCR: NEGATIVE

## 2014-03-26 LAB — BLOOD GAS, ARTERIAL
Acid-base deficit: 1.5 mmol/L (ref 0.0–2.0)
Bicarbonate: 23.4 mEq/L (ref 20.0–24.0)
DRAWN BY: 40530
FIO2: 0.45 %
O2 SAT: 93.6 %
PH ART: 7.329 — AB (ref 7.350–7.450)
Patient temperature: 100.1
TCO2: 24.8 mmol/L (ref 0–100)
pCO2 arterial: 46.3 mmHg — ABNORMAL HIGH (ref 35.0–45.0)
pO2, Arterial: 78.5 mmHg — ABNORMAL LOW (ref 80.0–100.0)

## 2014-03-26 LAB — I-STAT ARTERIAL BLOOD GAS, ED
Acid-Base Excess: 1 mmol/L (ref 0.0–2.0)
Bicarbonate: 27.2 mEq/L — ABNORMAL HIGH (ref 20.0–24.0)
O2 SAT: 89 %
PO2 ART: 62 mmHg — AB (ref 80.0–100.0)
TCO2: 29 mmol/L (ref 0–100)
pCO2 arterial: 52.8 mmHg — ABNORMAL HIGH (ref 35.0–45.0)
pH, Arterial: 7.32 — ABNORMAL LOW (ref 7.350–7.450)

## 2014-03-26 LAB — CBG MONITORING, ED: Glucose-Capillary: 152 mg/dL — ABNORMAL HIGH (ref 70–99)

## 2014-03-26 LAB — PREPARE RBC (CROSSMATCH)

## 2014-03-26 LAB — TSH: TSH: 1.15 u[IU]/mL (ref 0.350–4.500)

## 2014-03-26 LAB — RETICULOCYTES
RBC.: 2.27 MIL/uL — ABNORMAL LOW (ref 3.87–5.11)
Retic Count, Absolute: 118 10*3/uL (ref 19.0–186.0)
Retic Ct Pct: 5.2 % — ABNORMAL HIGH (ref 0.4–3.1)

## 2014-03-26 LAB — VITAMIN B12: Vitamin B-12: 1458 pg/mL — ABNORMAL HIGH (ref 211–911)

## 2014-03-26 LAB — TROPONIN I
TROPONIN I: 0.45 ng/mL — AB (ref <0.30)
Troponin I: 0.48 ng/mL (ref <0.30)
Troponin I: 0.48 ng/mL (ref <0.30)

## 2014-03-26 LAB — STREP PNEUMONIAE URINARY ANTIGEN: STREP PNEUMO URINARY ANTIGEN: NEGATIVE

## 2014-03-26 LAB — SODIUM, URINE, RANDOM

## 2014-03-26 LAB — FERRITIN: FERRITIN: 134 ng/mL (ref 10–291)

## 2014-03-26 LAB — FOLATE: Folate: >20 ng/mL

## 2014-03-26 LAB — CREATININE, URINE, RANDOM: Creatinine, Urine: 107.37 mg/dL

## 2014-03-26 MED ORDER — ACETAMINOPHEN 325 MG PO TABS
650.0000 mg | ORAL_TABLET | Freq: Once | ORAL | Status: AC
  Administered 2014-03-26: 650 mg via ORAL

## 2014-03-26 MED ORDER — SODIUM CHLORIDE 0.9 % IV SOLN
Freq: Once | INTRAVENOUS | Status: AC
  Administered 2014-03-26: 10 mL via INTRAVENOUS

## 2014-03-26 MED ORDER — SODIUM CHLORIDE 0.9 % IV BOLUS (SEPSIS)
1000.0000 mL | Freq: Once | INTRAVENOUS | Status: AC
  Administered 2014-03-26: 1000 mL via INTRAVENOUS

## 2014-03-26 MED ORDER — LACTULOSE 10 GM/15ML PO SOLN
20.0000 g | Freq: Two times a day (BID) | ORAL | Status: DC
  Administered 2014-03-26 – 2014-03-31 (×9): 20 g via ORAL
  Filled 2014-03-26 (×12): qty 30

## 2014-03-26 MED ORDER — LEVALBUTEROL HCL 0.63 MG/3ML IN NEBU
0.6300 mg | INHALATION_SOLUTION | Freq: Four times a day (QID) | RESPIRATORY_TRACT | Status: DC
  Administered 2014-03-26 – 2014-03-27 (×6): 0.63 mg via RESPIRATORY_TRACT
  Filled 2014-03-26 (×10): qty 3

## 2014-03-26 MED ORDER — SODIUM POLYSTYRENE SULFONATE 15 GM/60ML PO SUSP
15.0000 g | Freq: Once | ORAL | Status: DC
  Filled 2014-03-26: qty 60

## 2014-03-26 MED ORDER — IPRATROPIUM BROMIDE 0.02 % IN SOLN
0.5000 mg | Freq: Four times a day (QID) | RESPIRATORY_TRACT | Status: DC | PRN
  Administered 2014-03-26: 0.5 mg via RESPIRATORY_TRACT
  Filled 2014-03-26 (×2): qty 2.5

## 2014-03-26 MED ORDER — SODIUM POLYSTYRENE SULFONATE 15 GM/60ML PO SUSP
15.0000 g | Freq: Once | ORAL | Status: DC
Start: 2014-03-26 — End: 2014-03-26
  Filled 2014-03-26: qty 60

## 2014-03-26 MED ORDER — ACETAMINOPHEN 650 MG RE SUPP: 650.0000 mg | RECTAL | Status: DC | PRN

## 2014-03-26 MED ORDER — SIMVASTATIN 40 MG PO TABS
40.0000 mg | ORAL_TABLET | Freq: Every day | ORAL | Status: DC
  Administered 2014-03-27 – 2014-03-31 (×5): 40 mg via ORAL
  Filled 2014-03-26 (×6): qty 1

## 2014-03-26 MED ORDER — INSULIN GLARGINE 100 UNIT/ML ~~LOC~~ SOLN
80.0000 [IU] | Freq: Every day | SUBCUTANEOUS | Status: DC
  Administered 2014-03-26 – 2014-03-30 (×5): 80 [IU] via SUBCUTANEOUS
  Filled 2014-03-26 (×6): qty 0.8

## 2014-03-26 MED ORDER — ROPINIROLE HCL 1 MG PO TABS
4.0000 mg | ORAL_TABLET | Freq: Every day | ORAL | Status: DC
  Administered 2014-03-26 – 2014-03-30 (×6): 4 mg via ORAL
  Filled 2014-03-26 (×7): qty 4

## 2014-03-26 MED ORDER — IPRATROPIUM BROMIDE 0.02 % IN SOLN
0.5000 mg | Freq: Four times a day (QID) | RESPIRATORY_TRACT | Status: DC
  Administered 2014-03-26 – 2014-03-27 (×6): 0.5 mg via RESPIRATORY_TRACT
  Filled 2014-03-26 (×6): qty 2.5

## 2014-03-26 MED ORDER — SODIUM POLYSTYRENE SULFONATE 15 GM/60ML PO SUSP
45.0000 g | Freq: Once | ORAL | Status: AC
  Administered 2014-03-26: 45 g via RECTAL
  Filled 2014-03-26: qty 180

## 2014-03-26 MED ORDER — ACETAMINOPHEN 10 MG/ML IV SOLN
1000.0000 mg | Freq: Four times a day (QID) | INTRAVENOUS | Status: DC
  Administered 2014-03-26 – 2014-03-27 (×3): 1000 mg via INTRAVENOUS
  Filled 2014-03-26 (×4): qty 100

## 2014-03-26 MED ORDER — ALPRAZOLAM 0.5 MG PO TABS
1.0000 mg | ORAL_TABLET | Freq: Three times a day (TID) | ORAL | Status: DC | PRN
  Administered 2014-03-27 – 2014-03-30 (×6): 1 mg via ORAL
  Filled 2014-03-26 (×6): qty 2

## 2014-03-26 MED ORDER — PANTOPRAZOLE SODIUM 40 MG PO TBEC
40.0000 mg | DELAYED_RELEASE_TABLET | Freq: Every day | ORAL | Status: DC
  Administered 2014-03-27 – 2014-03-29 (×3): 40 mg via ORAL
  Filled 2014-03-26 (×3): qty 1

## 2014-03-26 MED ORDER — SODIUM CHLORIDE 0.9 % IV SOLN: INTRAVENOUS | Status: AC

## 2014-03-26 MED ORDER — MOMETASONE FURO-FORMOTEROL FUM 100-5 MCG/ACT IN AERO
2.0000 | INHALATION_SPRAY | Freq: Two times a day (BID) | RESPIRATORY_TRACT | Status: DC
  Administered 2014-03-27 – 2014-03-31 (×7): 2 via RESPIRATORY_TRACT
  Filled 2014-03-26 (×2): qty 8.8

## 2014-03-26 MED ORDER — LACTULOSE 10 GM/15ML PO SOLN
20.0000 g | Freq: Every day | ORAL | Status: DC
  Filled 2014-03-26: qty 30

## 2014-03-26 MED ORDER — VALACYCLOVIR HCL 500 MG PO TABS
1000.0000 mg | ORAL_TABLET | Freq: Every day | ORAL | Status: DC
  Administered 2014-03-27 – 2014-03-31 (×5): 1000 mg via ORAL
  Filled 2014-03-26 (×6): qty 2

## 2014-03-26 MED ORDER — SODIUM CHLORIDE 0.9 % IJ SOLN
3.0000 mL | Freq: Two times a day (BID) | INTRAMUSCULAR | Status: DC
  Administered 2014-03-26 – 2014-03-31 (×10): 3 mL via INTRAVENOUS

## 2014-03-26 MED ORDER — METHYLPREDNISOLONE SODIUM SUCC 125 MG IJ SOLR
60.0000 mg | Freq: Once | INTRAMUSCULAR | Status: AC
  Administered 2014-03-26: 60 mg via INTRAVENOUS
  Filled 2014-03-26: qty 0.96

## 2014-03-26 MED ORDER — NYSTATIN 100000 UNIT/GM EX POWD
Freq: Three times a day (TID) | CUTANEOUS | Status: DC
  Administered 2014-03-26 – 2014-03-31 (×17): via TOPICAL
  Filled 2014-03-26 (×2): qty 15

## 2014-03-26 MED ORDER — BUPROPION HCL ER (XL) 150 MG PO TB24
150.0000 mg | ORAL_TABLET | Freq: Every day | ORAL | Status: DC
  Administered 2014-03-27 – 2014-03-31 (×5): 150 mg via ORAL
  Filled 2014-03-26 (×6): qty 1

## 2014-03-26 MED ORDER — RIFAXIMIN 550 MG PO TABS
550.0000 mg | ORAL_TABLET | Freq: Every day | ORAL | Status: DC
  Administered 2014-03-27 – 2014-03-31 (×5): 550 mg via ORAL
  Filled 2014-03-26 (×6): qty 1

## 2014-03-26 MED ORDER — ONDANSETRON HCL 4 MG/2ML IJ SOLN: 4.0000 mg | Freq: Four times a day (QID) | INTRAMUSCULAR | Status: DC | PRN

## 2014-03-26 MED ORDER — ONDANSETRON HCL 4 MG PO TABS: 4.0000 mg | ORAL_TABLET | Freq: Four times a day (QID) | ORAL | Status: DC | PRN

## 2014-03-26 MED ORDER — INSULIN GLARGINE 100 UNIT/ML SOLOSTAR PEN: 80.0000 [IU] | PEN_INJECTOR | Freq: Every day | SUBCUTANEOUS | Status: DC

## 2014-03-26 MED ORDER — QUETIAPINE FUMARATE 400 MG PO TABS
400.0000 mg | ORAL_TABLET | Freq: Every day | ORAL | Status: DC
  Administered 2014-03-26 – 2014-03-30 (×5): 400 mg via ORAL
  Filled 2014-03-26 (×6): qty 1

## 2014-03-26 MED ORDER — LEVALBUTEROL HCL 0.63 MG/3ML IN NEBU
0.6300 mg | INHALATION_SOLUTION | RESPIRATORY_TRACT | Status: DC | PRN
  Administered 2014-03-26: 0.63 mg via RESPIRATORY_TRACT
  Filled 2014-03-26: qty 3

## 2014-03-26 NOTE — Progress Notes (Signed)
PT Cancellation Note  Patient Details Name: [name] MRN: [id number] DOB: Jan 15, 1941   Cancelled Treatment:     Evaluation deferred due to pt not medically able to participate. Pt confused and crying out. Will follow and proceed with PT evaluation when medically appropriate (pt able to participate).    Jamal Pavon 03/26/2014, 4:20 PM Pager [phone]

## 2014-03-26 NOTE — Progress Notes (Signed)
Discussion with daughter included patient's wishes for potential life support.  Daughter states patient wants no CPR and no intubation, but wants medications administered.  Will alert MD.

## 2014-03-26 NOTE — ED Notes (Signed)
Foley cath inserted urine sent for culture

## 2014-03-26 NOTE — Progress Notes (Signed)
[facility] PROGRESS NOTE  [name] MOQ:[id number] DOB: Sep 13, 1940 DOA: 03/25/2014  PCP: Ria Bush, MD  Brief HPI: 73yo with multiple medical problems as noted under PMH presented with worsening chest pain. This is thought to be due to recent sternal fracture sustained as a result of a fall. She was also noted to have ARF, UTI and confusion.  Past medical history:  Past Medical History  Diagnosis Date  . Chronic airway obstruction, not elsewhere classified   . Unspecified chronic bronchitis   . Unspecified essential hypertension   . Coronary atherosclerosis of unspecified type of vessel, native or graft   . Palpitations   . Aortic valve disorders   . Pure hypercholesterolemia   . Morbid obesity   . Esophageal reflux   . Angiodysplasia of intestine (without mention of hemorrhage)   . Diverticulosis of colon (without mention of hemorrhage)   . Benign neoplasm of colon   . Autoimmune hepatitis   . Cystitis, unspecified   . Osteoarthrosis, unspecified whether generalized or localized, unspecified site   . Osteoporosis, unspecified   . Restless legs syndrome (RLS)   . Major depressive disorder, recurrent episode, severe, specified as with psychotic behavior   . Iron deficiency anemia secondary to blood loss (chronic)   . Coarse tremors     "just in my arms"  . Carpal tunnel syndrome on both sides   . Dysrhythmia     palpitations  . CHF (congestive heart failure)   . Asthma   . Shortness of breath     "any time really"  . Sleep apnea     "but I don't use the equipment"  . Type II diabetes mellitus   . Blood transfusion   . H/O hiatal hernia   . 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 Adenomatous colon polyp   . Anginal pain   . Anxiety   . Pneumonia   . Heart murmur   . AVM (arteriovenous malformation)   . Portal hypertensive gastropathy   . Falls frequently   . UTI (lower urinary tract infection)   . Hiatal hernia   . GAVE (gastric antral  vascular ectasia)     Consultants: Cardiology  Procedures:  2D ECHO is pending  Antibiotics: Levaquin 7/31 Valtrex 8/1-->  Subjective: Patient moaning and mumbling. Difficult to get information from her. Admits to chest pain but not able to describe it.  Objective: Vital Signs  Filed Vitals:   03/26/14 0300 03/26/14 0400 03/26/14 0500 03/26/14 0700  BP: 109/65 127/63 136/54 145/58  Pulse: 58 61 65 69  Temp:  103 F (39.4 C)    TempSrc:  Oral    Resp: 23 18 20 19   Height:      Weight:      SpO2: 95% 96% 99% 86%    Intake/Output Summary (Last 24 hours) at 03/26/14 0739 Last data filed at 03/26/14 0515  Gross per 24 hour  Intake    500 ml  Output    450 ml  Net     50 ml   Filed Weights   03/25/14 2300  Weight: 105.2 kg (231 lb 14.8 oz)    General appearance: appears stated age, distracted, morbidly obese and uncooperative Head: Normocephalic, without obvious abnormality, atraumatic Resp: decreased air entry bilaterally. no definite crackles heard. No wheezing. Chest wall: bruising noted over midline with tenderness over sternum Cardio: regular rate and rhythm, S1, S2 normal, systolic murmur: early systolic 3/6, crescendo throughout the precordium,  no click, no rub and no edema GI: obese, slightly distended, BS present. Difficult to ascertain tenderness as she keeps moaning. no definite masses.  Extremities: bruising noted over anterior left leg. Good ROm bilateral knees and hips. Skin: wet erythema noted under breasts and lower abdo folds. Open blisters noted over left abdominal wall as well. No vesicles. Neurologic: Awake, moaning. Grips my fingers equally with both hands. No obvious facial droop. Not fully cooperative with examination.  Lab Results:  Basic Metabolic Panel:  Recent Labs Lab 03/20/14 2239 03/25/14 1927 03/25/14 2115 03/26/14 0259  NA 139 130*  --  133*  K 5.1 6.1* 5.8* 6.0*  CL 102 97  --  99  CO2 25 19  --  22  GLUCOSE 167* 185*  --   143*  BUN 71* 84*  --  86*  CREATININE 1.79* 2.78*  --  2.96*  CALCIUM 9.1 8.6  --  8.5   Liver Function Tests:  Recent Labs Lab 03/26/14 0055  AST 69*  ALT 29  ALKPHOS 101  BILITOT 2.4*  PROT 6.3  ALBUMIN 3.1*    Recent Labs Lab 03/26/14 0055  AMMONIA 62*   CBC:  Recent Labs Lab 03/20/14 2239 03/21/14 1029 03/25/14 1927 03/26/14 0259  WBC 3.4*  --  6.5 8.2  NEUTROABS  --   --  5.7  --   HGB 8.3* 8.1* 8.1* 7.5*  HCT 24.7*  --  24.5* 23.4*  MCV 101.2*  --  103.8* 103.1*  PLT 74*  --  80* 83*   Cardiac Enzymes:  Recent Labs Lab 03/25/14 1927 03/26/14 0055 03/26/14 0259  TROPONINI 0.55* 0.48* 0.48*   BNP (last 3 results)  Recent Labs  10/03/13 0045 01/19/14 1217 03/26/14 0259  PROBNP 207.5* 85.0 3352.0*   CBG:  Recent Labs Lab 03/26/14 0102 03/26/14 0411  GLUCAP 152* 151*    Recent Results (from the past 240 hour(s))  MRSA PCR SCREENING     Status: None   Collection Time    03/26/14  2:04 AM      Result Value Ref Range Status   MRSA by PCR NEGATIVE  NEGATIVE Final   Comment:            The GeneXpert MRSA Assay (FDA     approved for NASAL specimens     only), is one component of a     comprehensive MRSA colonization     surveillance program. It is not     intended to diagnose MRSA     infection nor to guide or     monitor treatment for     MRSA infections.      Studies/Results: Ct Chest Wo Contrast  03/25/2014   CLINICAL DATA:  Patient fell on 07/28 and was diagnosed with a sternal fracture. Patient is having increased pain since then.  EXAM: CT CHEST WITHOUT CONTRAST  TECHNIQUE: Multidetector CT imaging of the chest was performed following the standard protocol without IV contrast.  COMPARISON:  Sternum 03/22/2014  FINDINGS: The study is technically limited due to motion artifact and streak artifact arising from the patient's hands crossed across the chest. Focal cortical irregularity is suggested in the mid sternum corresponding to  nondisplaced fracture on prior plain films. Visualization of this area is limited due to artifact. There is minimal retrosternal soft tissue hematoma without significant fluid collection in the anterior mediastinum. A few prominent lymph nodes are demonstrated in the anterior mediastinum, likely reactive.  Cardiac enlargement. Coronary artery calcifications.  Normal caliber thoracic aorta with calcification. Pulmonary vascularity is somewhat prominent and congestive changes are not excluded. Esophagus is decompressed. But no pleural effusions. Evaluation of lungs is limited due to respiratory motion artifact. There appears to be atelectasis in the lung bases particularly on the right. Patchy atelectasis or infiltration in the right middle lobe and left lingula. No focal consolidation or collapse. No pneumothorax. Visualized portions of the upper abdominal organs demonstrate incomplete visualization of the liver and spleen but there appears to be a cirrhotic configuration to the liver and the spleen appears enlarged. Normal alignment of the thoracic vertebrae with degenerative changes present. Acute appearing fracture of the right lateral ninth rib. Additional old right rib fractures with callus formation are also demonstrated. No displaced fractures are identified.  IMPRESSION: Non depressed sternal fracture with small associated hematoma. Aorta is normal in caliber and no abnormal mediastinal fluid collections are suggested. Nondisplaced acute fracture of the right lateral ninth rib. Additional old right rib fractures. Patchy atelectasis or infiltration in both lungs. Technically limited study due to motion artifact.   Electronically Signed   By: Lucienne Capers M.D.   On: 03/25/2014 21:18    Medications:  Scheduled: . sodium chloride   Intravenous STAT  . sodium chloride   Intravenous STAT  . sodium chloride   Intravenous Once  . buPROPion  150 mg Oral Daily  . insulin aspart  0-9 Units Subcutaneous Q4H    . insulin glargine  80 Units Subcutaneous QHS  . lactulose  20 g Oral BID  . [START ON 03/28/2014] levofloxacin (LEVAQUIN) IV  500 mg Intravenous Q48H  . mometasone-formoterol  2 puff Inhalation BID  . nystatin   Topical TID  . pantoprazole  40 mg Oral Daily  . QUEtiapine  400 mg Oral QHS  . rifaximin  550 mg Oral Daily  . rOPINIRole  4 mg Oral QHS  . simvastatin  40 mg Oral Daily  . sodium chloride  3 mL Intravenous Q12H  . sodium polystyrene  15 g Oral Once  . sodium polystyrene  15 g Oral Once  . valACYclovir  1,000 mg Oral Daily   Continuous: . sodium chloride 125 mL/hr at 03/26/14 9518   ACZ:[id number], haloperidol lactate, hydrALAZINE, ipratropium, levalbuterol, morphine injection, ondansetron (ZOFRAN) IV, ondansetron  Assessment/Plan:  Active Problems:   IRON DEFICIENCY ANEMIA SECONDARY TO BLOOD LOSS   CAD   COPD   Chronic diastolic CHF (congestive heart failure)   Physical deconditioning   Cirrhosis of liver without mention of alcohol   Acute kidney injury   AKI (acute kidney injury)   Hyperkalemia   Elevated troponin   Hyponatremia   UTI (lower urinary tract infection)   Acute encephalopathy    Chest wall Pain  Most likely due to sternal fracture sustained as a result of a fall. Small associated hematoma also noted. Pain control.   Mildly Elevated troponin and Questionable new onset Atrial fibrillation Could have had cardiac contusion or could be due to renal failure. EKG not definitive for A fib. ECHO is pending. Will consult cardiology. She was seen by cardiology in June and is apparently not a candidate for invasive procedures because of her h/o GI bleed. I will for now hold off on aspirin due to her complicated history of GI bleed and thrombocytopenia and at least till cardiology input is obtained.   Acute on CKD stage 3 with Hyperkalemia Possibly from dehydration and decreased po intake. Continue IVF. Kayexalate for elevated K. No EKG changes.  Repeat K  level later today. Monitor UO. If renal function doesn't improve as expected we may need an [place].  Fever and Abnormal UA/UTI Fever most likely due to UTI. On Levaquin. Urine and blood cultures pending. Monitor for now. CXR raised possibility of infiltrates as well.  Acute Hypoxic Respiratory failure and questionable CAP/COPD on Home O2 Continue O2. Continue Abx and inhaled steroids. No further doses of steroids for now. ABG reviewed.  Acute encephalopathy Multifactorial due to pain, UTI, hypoxia, ARF. Due to falls will get CT head. Also noted to have elevated Ammonia. Increase Lactulose. Haldol PRN.  Hyponatremia Possibly from dehydyration. Stopped spironolactone and lasix. On IVF.   Anemia and thrombocytopenia This is chronic due to chronic GI bleed. Hgb slightly lower today. Due to hypoxia, elevated trop will go ahead with blood transfusion. Monitor platelet counts closely.  Diabetes Mellitus Type 2 Her last hgba1c from 7/28 is 5.3. Continue current doses of Lantus and SSI.   Transient Hypotension  Resolved with fluids. Holding all bp medications and lasix, spironolactone.   Chronic diastolic heart failure Appears to be compensated. No fluid overload. No pedal edema. Caution with fluids.   History of NASH/Liver cirrhosis and splenomegaly/Hepatic Encephalopathy Continue Lactulose and rifaximin. Ammonia level noted to be elevated.  Code Status: Full Code  DVT Prophylaxis: SCD's    Family Communication: No family at bedside  Disposition Plan: Continue stepdown status.    LOS: 1 day   [facility] Pager [phone] 03/26/2014, 7:39 AM  If 8PM-8AM, please contact night-coverage at [email or web address], password [email or web address]   Disclaimer: This note was dictated with voice recognition software. Similar sounding words can inadvertently be transcribed and may not be corrected upon review.

## 2014-03-26 NOTE — Consult Note (Signed)
The patient was seen and examined, and I agree with the assessment and plan as documented above. Pt has chest pain after sustaining a sternal fracture with small hematoma and mild troponin elevation. Has known nonobstructive CAD. I suspect her troponin elevation and chest pain are likely secondary to cardiac contusion (vs demand ischemia due to marked anemia). Normal LV systolic function and regional wall motion by echo. No pericardial effusion. ECG shows sinus rhythm with 1st degree AV block. I do not recommend stress testing as she is not a candidate for angiography/PCI given chronic GI blood less and anemia/thrombocytopenia. At prior office visits, she declined nitrates.  Would continue conservative management at this point. No additional recommendations.

## 2014-03-26 NOTE — Progress Notes (Signed)
Echocardiogram 2D Echocardiogram has been performed.  Joelene Millin 03/26/2014, 11:28 AM

## 2014-03-26 NOTE — Progress Notes (Signed)
Pt. Refusing to take po liquids and medication.  Answers yes and no questions, but otherwise is non-sensical with responses.  Frequent calling out and moaning.  Unable to re-orient.  MD at bedside.  Aware of status.

## 2014-03-26 NOTE — Consult Note (Signed)
CARDIOLOGY CONSULT NOTE   Patient ID: [name] MRN: [id number], DOB/AGE: [birth year]   Admit date: 03/25/2014 Date of Consult: 03/26/2014   Primary Physician: Ria Bush, MD Primary Cardiologist: Jerilynn Mages. Burt Knack, MD   Pt. Profile  73 y/o female with h/o hepatic cirrhosis/NASH, chronic GI blood loss and anemia req frequent transfusions, and atypical chest pain, who was admitted 7/31 with persistent c/p following fall 10 days prior to admission and has been found to have a sternal fx.  We have been asked to eval 2/2 elevated troponin.  Problem List  Past Medical History  Diagnosis Date  . Chronic airway obstruction, not elsewhere classified   . Unspecified chronic bronchitis   . Unspecified essential hypertension   . Non-obstructive CAD   . Palpitations   . Mild aortic stenosis     a. 03/2014 Valve area (VTI): 2.89 cm^2, Valve area (Vmax): 2.7 cm^2.  . Pure hypercholesterolemia   . Morbid obesity   . Esophageal reflux   . Angiodysplasia of intestine (without mention of hemorrhage)   . Diverticulosis of colon (without mention of hemorrhage)   . Benign neoplasm of colon   . Autoimmune hepatitis   . Cystitis, unspecified   . Osteoarthrosis, unspecified whether generalized or localized, unspecified site   . Osteoporosis, unspecified   . Restless legs syndrome (RLS)   . Major depressive disorder, recurrent episode, severe, specified as with psychotic behavior   . Iron deficiency anemia secondary to blood loss (chronic)     a. frequent PRBC transfusions.  . Coarse tremors     a. arms.  . Carpal tunnel syndrome on both sides   . Palpitations   . Chronic diastolic CHF (congestive heart failure)     a. 03/26/2014 Echo: EF 55-60%, no rwma, mild AS/AI, mod-sev Ca2+ MV annulus, mildly to mod dil LA.  [name] Asthma   . OSA (obstructive sleep apnea)     a. does not use CPAP.  [name] Type II diabetes mellitus   . H/O hiatal hernia   . Cirrhosis     a. dx'd 1990's  . Adenomatous colon  polyp   . Midsternal chest pain     a. conservatively managed ->poor candidate for cath/anticoagulation.  [name] Anxiety   . History of pneumonia   . AVM (arteriovenous malformation)   . Portal hypertensive gastropathy   . Falls frequently   . UTI (lower urinary tract infection)   . Hiatal hernia   . GAVE (gastric antral vascular ectasia)   . Nonalcoholic steatohepatitis (NASH)     Past Surgical History  Procedure Laterality Date  . Hemorroidectomy    . Appendectomy    . Vaginal hysterectomy    . Breast biopsy      bilateral  . Cesarean section      x 3  . Appendectomy    . Esophagogastroduodenoscopy  06/12/2012    Procedure: ESOPHAGOGASTRODUODENOSCOPY (EGD);  Surgeon: Lafayette Dragon, MD;  Location: [facility];  Service: Endoscopy;  Laterality: N/A;  . Balloon dilation  06/12/2012    Procedure: BALLOON DILATION;  Surgeon: Lafayette Dragon, MD;  Location: [facility];  Service: Endoscopy;  Laterality: N/A;  ?balloon  . Esophagogastroduodenoscopy  09/10/2012    Procedure: ESOPHAGOGASTRODUODENOSCOPY (EGD);  Surgeon: Lafayette Dragon, MD;  Location: [facility];  Service: Endoscopy;  Laterality: N/A;  . Hot hemostasis  09/10/2012    Procedure: HOT HEMOSTASIS (ARGON PLASMA COAGULATION/BICAP);  Surgeon: Lafayette Dragon, MD;  Location: [facility];  Service: Endoscopy;  Laterality:  N/A;  . Esophagogastroduodenoscopy N/A 01/18/2013    Procedure: ESOPHAGOGASTRODUODENOSCOPY (EGD);  Surgeon: Lafayette Dragon, MD;  Location: [facility];  Service: Endoscopy;  Laterality: N/A;  . Esophagogastroduodenoscopy N/A 05/24/2013    Procedure: ESOPHAGOGASTRODUODENOSCOPY (EGD);  Surgeon: Jerene Bears, MD;  Location: [facility];  Service: Gastroenterology;  Laterality: N/A;  . Hot hemostasis N/A 05/24/2013    Procedure: HOT HEMOSTASIS (ARGON PLASMA COAGULATION/BICAP);  Surgeon: Jerene Bears, MD;  Location: [facility];  Service: Gastroenterology;  Laterality: N/A;     Allergies  Allergies  Allergen Reactions    . Aspirin Other (See Comments)    Causes nosebleeds  . Penicillins Itching  . Theophylline Nausea And Vomiting    HPI   73 y/o female with the above complex problem list.  She has a h/o nonobs CAD, diast CHF, cirrhosis, NASH, anemia and chronic GI blood loss 2/2 GAVE req outpt blood transfusions.  She was last seen in our office in June at which time she was c/o chest pain and after review with Dr. Burt Knack, conservative rx was recommended 2/2 her co-morbidities and inability to anticoagulate her.  Approx 10 days ago, she suffered a fall and has been having worsening chest pain.  Her husband brought her to the ER yesterday and upon imaging, she was found to have a sternal fx with a small associated hematoma.  She was also found to have a UTI and suffered from delirium.  There was some concern about possible afib on ECG, however upon closer review, this shows sinus rhythm with 1st deg AVB.  We have been asked to eval 2/2 c/p with mild troponin elevation in the setting of acute on chronic renal failure.  Creat was 2.78 on admission and is 2.60 currently.  Her home doses of diuretics have been held.  Echo this AM showed nl EF with nl wall motion.  She has had delirium and encephalopathy and is currently somewhat agitated with family at the bedside.  C/P persists and is worse with deep breathing and palpation.  Inpatient Medications  . sodium chloride   Intravenous Once  . acetaminophen  1,000 mg Intravenous 4 times per day  . buPROPion  150 mg Oral Daily  . insulin aspart  0-9 Units Subcutaneous Q4H  . insulin glargine  80 Units Subcutaneous QHS  . ipratropium  0.5 mg Nebulization Q6H  . lactulose  20 g Oral BID  . levalbuterol  0.63 mg Nebulization Q6H  . [START ON 03/28/2014] levofloxacin (LEVAQUIN) IV  500 mg Intravenous Q48H  . mometasone-formoterol  2 puff Inhalation BID  . nystatin   Topical TID  . pantoprazole  40 mg Oral Daily  . QUEtiapine  400 mg Oral QHS  . rifaximin  550 mg Oral Daily   . rOPINIRole  4 mg Oral QHS  . simvastatin  40 mg Oral Daily  . sodium chloride  3 mL Intravenous Q12H  . sodium polystyrene  45 g Rectal Once  . valACYclovir  1,000 mg Oral Daily    Family History Family History  Problem Relation Age of Onset  . Heart disease Mother   . Cervical cancer Mother   . Kidney disease Mother   . Diabetes Mother   . Breast cancer Sister   . Multiple sclerosis Sister   . Colon cancer Neg Hx   . Breast cancer Maternal Aunt     x 2  . Diabetes Father   . Diabetes Sister   . Multiple sclerosis Other  Social History History   Social History  . Marital Status: Married    Spouse Name: jerry Kunzman    Number of Children: 4  . Years of Education: 12   Occupational History  . Disabled   .     Social History Main Topics  . Smoking status: Former Smoker -- 1.50 packs/day for 25 years    Types: Cigarettes    Quit date: 08/26/1992  . Smokeless tobacco: Never Used  . Alcohol Use: No     Comment: 5/78/46 "last alcoholic drink was years ago"  . Drug Use: No  . Sexual Activity: No   Other Topics Concern  . Not on file   Social History Narrative   Patient lives at home with her husband [name]).    Retired.   Caffeine- None    Right handed.      Dr. Olevia Perches GI    Dr. Burt Knack cards    Dr. Loanne Drilling endo     Review of Systems  General:  No chills, fever, night sweats or weight changes.  Cardiovascular:  +++ chest pain - worse with deep breathing and palpation.  Chronic dyspnea on exertion, no edema, orthopnea, palpitations, paroxysmal nocturnal dyspnea. Dermatological: No rash, lesions/masses Respiratory: No cough, +++ dyspnea Urologic: No hematuria, dysuria Abdominal:   No nausea, vomiting, diarrhea, bright red blood per rectum, melena, or hematemesis Neurologic:  No visual changes, +++ wkns, changes in mental status. All other systems reviewed and are otherwise negative except as noted above.  Physical Exam  Blood pressure 147/83,  pulse 71, temperature 98.5 F (36.9 C), temperature source Oral, resp. rate 14, height 5' 4.17" (1.63 m), weight 231 lb 14.8 oz (105.2 kg), SpO2 98.00%.  General: Pleasant, NAD Psych: Normal affect. Neuro: Alert and oriented X to person/place. Moves all extremities spontaneously.  Follows commands. HEENT: Normal  Neck: Supple without bruits.  Obese, difficult to assess JVP. Lungs:  Resp regular and unlabored, diminished breath sounds bilat R>L. Heart: RRR no s3, s4, or murmurs.  Chest wall is very tender.  C/P worse with deep breathing. Abdomen: Soft, non-tender, non-distended, BS + x 4.  Extremities: No clubbing, cyanosis or edema. DP/PT/Radials 2+ and equal bilaterally.  Labs   Recent Labs  03/25/14 1927 03/26/14 0055 03/26/14 0259 03/26/14 1120  TROPONINI 0.55* 0.48* 0.48* 0.45*   Lab Results  Component Value Date   WBC 8.2 03/26/2014   HGB 7.5* 03/26/2014   HCT 23.4* 03/26/2014   MCV 103.1* 03/26/2014   PLT 83* 03/26/2014    Recent Labs Lab 03/26/14 0055  03/26/14 1120  NA  --   < > 134*  K  --   < > 5.7*  CL  --   < > 100  CO2  --   < > 20  BUN  --   < > 81*  CREATININE  --   < > 2.60*  CALCIUM  --   < > 8.7  PROT 6.3  --   --   BILITOT 2.4*  --   --   ALKPHOS 101  --   --   ALT 29  --   --   AST 69*  --   --   GLUCOSE  --   < > 195*  < > = values in this interval not displayed. Lab Results  Component Value Date   CHOL 108 03/22/2014   HDL 36.00* 03/22/2014   LDLCALC 57 03/22/2014   TRIG 75.0 03/22/2014   Radiology/Studies  Dg  Sternum  03/22/2014   CLINICAL DATA:  Chest pain  EXAM: STERNUM - 2+ VIEW IMPRESSION: Mid sternal fracture   Electronically Signed   By: Inez Catalina M.D.   On: 03/22/2014 13:15   Ct Head Wo Contrast  03/26/2014   CLINICAL DATA:  Altered mental status and falls.  EXAM: CT HEAD WITHOUT CONTRAST   IMPRESSION: Motion artifact. No acute intracranial hemorrhage or definite acute large territory infarct. Apparent hypoattenuation in the anterior  left temporal lobe is favored to be artifactual, however if there is concern for acute infarct consider further evaluation with brain MRI. Small foci of decreased density in the thalami may be artifactual or reflect lacunar infarcts of indeterminate age.   Electronically Signed   By: Logan Bores   On: 03/26/2014 09:06   Ct Chest Wo Contrast  03/25/2014   CLINICAL DATA:  Patient fell on 07/28 and was diagnosed with a sternal fracture. Patient is having increased pain since then.  EXAM: CT CHEST WITHOUT CONTRAST    IMPRESSION: Non depressed sternal fracture with small associated hematoma. Aorta is normal in caliber and no abnormal mediastinal fluid collections are suggested. Nondisplaced acute fracture of the right lateral ninth rib. Additional old right rib fractures. Patchy atelectasis or infiltration in both lungs. Technically limited study due to motion artifact.   Electronically Signed   By: Lucienne Capers M.D.   On: 03/25/2014 21:18   US Abdomen Limited  03/26/2014   CLINICAL DATA:  Abdominal distention, evaluate for ascites  EXAM: LIMITED ABDOMEN ULTRASOUND FOR ASCITES  TECHNIQUE: Limited ultrasound survey for ascites was performed in all four abdominal quadrants.  COMPARISON:  Prior abdominal ultrasound 02/02/2013; prior CT abdomen/pelvis 06/02/2007  FINDINGS: Negative for ascites.  The spleen is markedly enlarged measuring 17.8 x 16.5 x 7.8 cm yielding a calculated volume of 1200 cubic cm. Partial imaging of the liver demonstrates increased echogenicity and heterogeneity of the parenchyma. The liver contour appears slightly nodular.  Decompressed bladder with Foley catheter in place.  IMPRESSION: 1. Negative for ascites. 2. Incomplete imaging of the liver demonstrates findings concerning for underlying cirrhosis. 3. Marked splenomegaly perhaps secondary to portal hypertension.   Electronically Signed   By: Jacqulynn Cadet M.D.   On: 03/26/2014 07:42   2D Echocardiogram 8.1.2015  Study  Conclusions  - Left ventricle: The cavity size was normal. Systolic function was   normal. The estimated ejection fraction was in the range of 55%   to 60%. Wall motion was normal; there were no regional wall   motion abnormalities. - Aortic valve: Mildly thickened leaflets. There was very mild   stenosis. There was mild regurgitation. Valve area (VTI): 2.89   cm^2. Valve area (Vmax): 2.7 cm^2. - Mitral valve: Moderately to severely calcified annulus. - Left atrium: The atrium was mildly to moderately dilated. _____________   ECG  SB, 57, 1st deg AVB, IVCD, no acute ST/T changes.  ASSESSMENT AND PLAN  1. Elevated troponin:  Pt presented with a 10 day h/o chest pain, following fall, and has been found to have a sternal fx.  ECG non-acute however troponin mildly elevated with peak of 0.55 (initial troponin this admission).  Trend has been flat and downward - currently 0.45.  Echo this AM showed nl LV fxn w/o regional WMA's.  Suspect c/p is 2/2 sternal fx, while mild troponin elevation may be accounted for by possible demand ischemia.  It has previously been outlined by Dr. Burt Knack that she is a poor candidate for further  ischemic evaluation secondary to chronic GI blood loss/anemia thrombocytopenia, and inability to use anticoagulation.  Cont conservative therapy including statin therapy.  2.  Sternal Fracture:  Worsening c/p over past 10 days since fall.  Cont to have significant c/p that is worse with deep breathing and palpation over sternum.  No evidence of significant pericardial effusion or tamponade on echo.  Pain mgmt per IM.  3.  Acute on chronic stage III kidney dzs: creat sl improved today.  4.  UTI:  Abx per IM.  5.  COPD:  On abx/inhalers/steroids/O2.  6.  Anemia/Pancytopenia with chronic GI blood loss:  Req frequent transfusions as an outpt.  Hgb down slightly this AM.  Per IM.  7.  NASH/Liver cirrhosis and splenomegaly/Hepatic Encephalopathy:  Lactulose and rifaximin per  IM.  8.  Chronic diastolic CHF:  Nl EF by echo this AM. Volume stable.  Diuretics on hold in setting of #3.  9.  Hyperkalemia:  Arlyce Harman on hold.  Follow.   Signed, Murray Hodgkins, NP 03/26/2014, 3:01 PM

## 2014-03-27 DIAGNOSIS — J189 Pneumonia, unspecified organism: Secondary | ICD-10-CM | POA: Diagnosis present

## 2014-03-27 LAB — COMPREHENSIVE METABOLIC PANEL
ALT: 23 U/L (ref 0–35)
ANION GAP: 11 (ref 5–15)
AST: 48 U/L — AB (ref 0–37)
Albumin: 2.4 g/dL — ABNORMAL LOW (ref 3.5–5.2)
Alkaline Phosphatase: 78 U/L (ref 39–117)
BILIRUBIN TOTAL: 1.5 mg/dL — AB (ref 0.3–1.2)
BUN: 75 mg/dL — ABNORMAL HIGH (ref 6–23)
CHLORIDE: 110 meq/L (ref 96–112)
CO2: 22 meq/L (ref 19–32)
Calcium: 7.8 mg/dL — ABNORMAL LOW (ref 8.4–10.5)
Creatinine, Ser: 2.05 mg/dL — ABNORMAL HIGH (ref 0.50–1.10)
GFR calc non Af Amer: 23 mL/min — ABNORMAL LOW (ref >90)
GFR, EST AFRICAN AMERICAN: 27 mL/min — AB (ref >90)
Glucose, Bld: 141 mg/dL — ABNORMAL HIGH (ref 70–99)
POTASSIUM: 4.6 meq/L (ref 3.7–5.3)
SODIUM: 143 meq/L (ref 137–147)
Total Protein: 5.2 g/dL — ABNORMAL LOW (ref 6.0–8.3)

## 2014-03-27 LAB — TYPE AND SCREEN
ABO/RH(D): O POS
Antibody Screen: POSITIVE
DAT, IGG: NEGATIVE
DONOR AG TYPE: NEGATIVE
Donor AG Type: NEGATIVE
UNIT DIVISION: 0
Unit division: 0

## 2014-03-27 LAB — GLUCOSE, CAPILLARY
GLUCOSE-CAPILLARY: 201 mg/dL — AB (ref 70–99)
GLUCOSE-CAPILLARY: 128 mg/dL — AB (ref 70–99)
Glucose-Capillary: 152 mg/dL — ABNORMAL HIGH (ref 70–99)
Glucose-Capillary: 94 mg/dL (ref 70–99)
Glucose-Capillary: 167 mg/dL — ABNORMAL HIGH (ref 70–99)

## 2014-03-27 LAB — CBC
HCT: 24.5 % — ABNORMAL LOW (ref 36.0–46.0)
Hemoglobin: 8 g/dL — ABNORMAL LOW (ref 12.0–15.0)
MCH: 32.5 pg (ref 26.0–34.0)
MCHC: 32.7 g/dL (ref 30.0–36.0)
MCV: 99.6 fL (ref 78.0–100.0)
PLATELETS: 66 10*3/uL — AB (ref 150–400)
RBC: 2.46 MIL/uL — AB (ref 3.87–5.11)
RDW: 20.2 % — ABNORMAL HIGH (ref 11.5–15.5)
WBC: 5 10*3/uL (ref 4.0–10.5)

## 2014-03-27 MED ORDER — IPRATROPIUM BROMIDE 0.02 % IN SOLN
0.5000 mg | Freq: Three times a day (TID) | RESPIRATORY_TRACT | Status: DC
  Administered 2014-03-28 – 2014-03-30 (×7): 0.5 mg via RESPIRATORY_TRACT
  Filled 2014-03-27 (×8): qty 2.5

## 2014-03-27 MED ORDER — ACETAMINOPHEN 325 MG PO TABS: 650.0000 mg | ORAL_TABLET | Freq: Four times a day (QID) | ORAL | Status: DC | PRN

## 2014-03-27 MED ORDER — OXYCODONE HCL 5 MG PO TABS
5.0000 mg | ORAL_TABLET | ORAL | Status: DC | PRN
  Administered 2014-03-27 – 2014-03-31 (×4): 5 mg via ORAL
  Filled 2014-03-27 (×4): qty 1

## 2014-03-27 MED ORDER — LEVALBUTEROL HCL 0.63 MG/3ML IN NEBU
0.6300 mg | INHALATION_SOLUTION | Freq: Three times a day (TID) | RESPIRATORY_TRACT | Status: DC
  Administered 2014-03-28 – 2014-03-30 (×7): 0.63 mg via RESPIRATORY_TRACT
  Filled 2014-03-27 (×17): qty 3

## 2014-03-27 MED ORDER — LEVOFLOXACIN IN D5W 750 MG/150ML IV SOLN
750.0000 mg | INTRAVENOUS | Status: DC
  Administered 2014-03-28: 750 mg via INTRAVENOUS
  Filled 2014-03-27: qty 150

## 2014-03-27 NOTE — Progress Notes (Signed)
Utilization Review Completed.   Orvetta Danielski, RN, BSN Nurse Case Manager  

## 2014-03-27 NOTE — Progress Notes (Signed)
Pt transferred to [facility].

## 2014-03-27 NOTE — Evaluation (Signed)
Physical Therapy Evaluation Patient Details Name: [name] MRN: [id number] DOB: Apr 09, 1941 Today's Date: 03/27/2014   History of Present Illness  73 y.o. female with prior h/o hypertension, diabetes Mellitus on insulin, chronic diastolic heart failure, obesity, COPD on home oxygen 3 lit/min, major depression with anxiety, psychosis, restless leg syndrome, iron deficiency anemia secondary to chronic blood loss from AVM's, sleep apnea, liver cirrhosis, brought in by her husband for persistent substernal chest pain since 10 days. Patient is confused and delirious and hollering occasionally,. Most of the history available is from the patient 's husband at bedside, who is not a good historian. As per the husband she fell 10 days ago , had a sternal fracture and a few rib fractures. She was seen in ED few days ago and sent home on oxycodone. She was brought back for persistent pain.   Clinical Impression  Pt tolerated OOB mobility well despite inappropriate hollering out t/o session. Pt did follow simple commands. Pt with + SOB and dec SpO2 to 78% on 3LO2 via [place]. O2 increased to 5LO2 pt SpO2 > 90% t/o rest of session. Spoke with spouse re: functional and cognitive impairments pt demonstrates and asked if caregiver (neighbor) could provide assist. Spouse unclear and reports he hopes she's back to normal when he takes her home. Pt currently requires assist for all mobility and ADLs. If caregiver can not provide necessary assist pt may need ST-SNF to achieve supervision level for safe transition home.    Follow Up Recommendations Home health PT;Supervision/Assistance - 24 hour (VS SNF if neighbor can't provide physical assist)    Equipment Recommendations  None recommended by PT    Recommendations for Other Services       Precautions / Restrictions Precautions Precautions: Fall Restrictions Weight Bearing Restrictions: No      Mobility  Bed Mobility Overal bed mobility: Needs Assistance;+2  for physical assistance Bed Mobility: Supine to Sit     Supine to sit: Mod assist;+2 for physical assistance     General bed mobility comments: pt initiated bringing LEs off EOB, assist to elevate trunk and bring hips to EOB  Transfers Overall transfer level: Needs assistance Equipment used: Rolling walker (2 wheeled) Transfers: Sit to/from [facility] Sit to Stand: Min assist;+2 safety/equipment Stand pivot transfers: Min assist;+2 safety/equipment       General transfer comment: max directional v/c's, assist for safe hand placement/walker management  Ambulation/Gait Ambulation/Gait assistance: Min assist;+2 safety/equipment Ambulation Distance (Feet): 10 Feet Assistive device: Rolling walker (2 wheeled) Gait Pattern/deviations: Step-through pattern;Decreased stride length;Shuffle Gait velocity: slow Gait velocity interpretation: Below normal speed for age/gender General Gait Details: + SOB, pt hollering out during amb but able to tolerate amb  Stairs            Wheelchair Mobility    Modified Rankin (Stroke Patients Only)       Balance Overall balance assessment: Needs assistance Sitting-balance support: Feet supported;No upper extremity supported Sitting balance-Leahy Scale: Fair     Standing balance support: During functional activity Standing balance-Leahy Scale: Poor Standing balance comment: requires use of RW. pt stood x 2 min for hygiene s/p stool incontinence                             Pertinent Vitals/Pain Reports of chest pain but could not rate    Home Living Family/patient expects to be discharged to:: Private residence Living Arrangements: Spouse/significant other Available Help at Discharge:  Family Type of Home: House Home Access: Stairs to enter Entrance Stairs-Rails: Right Entrance Stairs-Number of Steps: 3 Home Layout: One level Home Equipment: Walker - 4 wheels Additional Comments: Home O2 (3LO2)     Prior Function Level of Independence: Needs assistance   Gait / Transfers Assistance Needed: uses rollator  ADL's / Homemaking Assistance Needed: spouse assist with bathing at EOB and transfer to/from toliet.  Comments: spouse reports his neighbor stays with pt t/o the day while he is at work     Hand Dominance   Dominant Hand: Right    Extremity/Trunk Assessment   Upper Extremity Assessment: Generalized weakness (due to chest pain)           Lower Extremity Assessment: Generalized weakness      Cervical / Trunk Assessment: Kyphotic  Communication   Communication: Expressive difficulties (inappropriate hollering out)  Cognition Arousal/Alertness: Awake/alert Behavior During Therapy: Anxious Overall Cognitive Status: Impaired/Different from baseline Area of Impairment: Safety/judgement;Following commands;Attention   Current Attention Level: Focused Memory: Decreased short-term memory Following Commands: Follows one step commands with increased time       General Comments: pt able to follow commands but also simultaneously hollars out. ie. as patient is walking pt hollering "i can't do this"    General Comments      Exercises        Assessment/Plan    PT Assessment Patient needs continued PT services  PT Diagnosis Acute pain;Difficulty walking   PT Problem List Decreased strength;Decreased activity tolerance;Decreased balance;Decreased mobility  PT Treatment Interventions DME instruction;Gait training;Stair training;Functional mobility training;Therapeutic activities;Therapeutic exercise   PT Goals (Current goals can be found in the Care Plan section) Acute Rehab PT Goals Patient Stated Goal: none stated PT Goal Formulation: With patient Time For Goal Achievement: 04/03/14 Potential to Achieve Goals: Good    Frequency Min 3X/week   Barriers to discharge   unsure if neighbor would be able to physically assist pt    Co-evaluation                End of Session Equipment Utilized During Treatment: Gait belt Activity Tolerance: Patient tolerated treatment well Patient left: in chair;with call bell/phone within reach;with family/visitor present Nurse Communication: Mobility status         Time: 7824-2353 PT Time Calculation (min): 28 min   Charges:   PT Evaluation $Initial PT Evaluation Tier I: 1 Procedure PT Treatments $Gait Training: 8-22 mins   PT G CodesKingsley Callander 03/27/2014, 1:28 PM  Kittie Plater, PT, DPT Pager #: [phone] Office #: [phone]

## 2014-03-27 NOTE — Progress Notes (Signed)
Pt assisted up to the chair, per pt request. Daughter at the bedside. Pain medications given. No further concerns.

## 2014-03-27 NOTE — Progress Notes (Signed)
ANTIBIOTIC CONSULT NOTE - FOLLOW UP  Pharmacy Consult for Levaquin Indication: UTI, possible CAP  Allergies  Allergen Reactions  . Aspirin Other (See Comments)    Causes nosebleeds  . Penicillins Itching  . Theophylline Nausea And Vomiting    Patient Measurements: Height: 5' 4.17" (163 cm) Weight: 231 lb 14.8 oz (105.2 kg) IBW/kg (Calculated) : 55.1 Adjusted Body Weight:   Vital Signs: Temp: 98.2 F (36.8 C) (08/02 0313) Temp src: Axillary (08/02 0313) BP: 119/52 mmHg (08/02 0313) Pulse Rate: 58 (08/02 0313) Intake/Output from previous day: 08/01 0701 - 08/02 0700 In: 2581.7 [I.V.:1924.2; Blood:557.5; IV Piggyback:100] Out: 1725 [Urine:1725] Intake/Output from this shift: Total I/O In: 100 [I.V.:100] Out: -   Labs:  Recent Labs  03/25/14 1927 03/26/14 0118 03/26/14 0259 03/26/14 1120 03/27/14 0250  WBC 6.5  --  8.2  --  5.0  HGB 8.1*  --  7.5*  --  8.0*  PLT 80*  --  83*  --  66*  LABCREA  --  107.37  --   --   --   CREATININE 2.78*  --  2.96* 2.60* 2.05*   Estimated Creatinine Clearance: 30.3 ml/min (by C-G formula based on Cr of 2.05). No results found for this basename: Letta Median, VANCORANDOM, GENTTROUGH, GENTPEAK, GENTRANDOM, TOBRATROUGH, TOBRAPEAK, TOBRARND, AMIKACINPEAK, AMIKACINTROU, AMIKACIN,  in the last 72 hours   Microbiology: Recent Results (from the past 720 hour(s))  URINE CULTURE     Status: None   Collection Time    03/25/14  9:30 PM      Result Value Ref Range Status   Specimen Description URINE, CATHETERIZED   Final   Special Requests NONE   Final   Culture  Setup Time     Final   Value: 03/26/2014 01:29     Performed at [facility]     Final   Value: >=100,000 COLONIES/ML     Performed at [facility]   Culture     Final   Value: ESCHERICHIA COLI     Performed at [facility]   Report Status PENDING   Incomplete  MRSA PCR SCREENING     Status: None   Collection Time   03/26/14  2:04 AM      Result Value Ref Range Status   MRSA by PCR NEGATIVE  NEGATIVE Final   Comment:            The GeneXpert MRSA Assay (FDA     approved for NASAL specimens     only), is one component of a     comprehensive MRSA colonization     surveillance program. It is not     intended to diagnose MRSA     infection nor to guide or     monitor treatment for     MRSA infections.    Anti-infectives   Start     Dose/Rate Route Frequency Ordered Stop   03/28/14 0400  levofloxacin (LEVAQUIN) IVPB 500 mg  Status:  Discontinued     500 mg 100 mL/hr over 60 Minutes Intravenous Every 48 hours 03/25/14 2322 03/27/14 0911   03/28/14 0400  levofloxacin (LEVAQUIN) IVPB 750 mg     750 mg 100 mL/hr over 90 Minutes Intravenous Every 48 hours 03/27/14 0911     03/26/14 1000  rifaximin (XIFAXAN) tablet 550 mg     550 mg Oral Daily 03/26/14 0146     03/26/14 1000  valACYclovir (VALTREX) tablet 1,000 mg  1,000 mg Oral Daily 03/26/14 0738     03/25/14 2330  levofloxacin (LEVAQUIN) IVPB 750 mg     750 mg 100 mL/hr over 90 Minutes Intravenous  Once 03/25/14 2322 03/26/14 0529   03/25/14 2315  levofloxacin (LEVAQUIN) IVPB 750 mg  Status:  Discontinued     750 mg 100 mL/hr over 90 Minutes Intravenous Every 24 hours 03/25/14 2313 03/25/14 2321      Assessment: 72yof on Levaquin IV Day 2 for Ecoli UTI and possible CAP. Patient is now afebrile and WBC wnl. Awaiting Ecoli sensitivities. - SCr improving - CrCl 30 ml/min  Plan:  1. Change Levaquin to 750mg  IV q48h 2. Monitor renal function, clinical course and narrow based on cultures  Earleen Newport [phone] 03/27/2014,9:12 AM

## 2014-03-27 NOTE — Evaluation (Addendum)
Clinical/Bedside Swallow Evaluation Patient Details  Name: [name] MRN: [id number] Date of Birth: 1940/11/30  Today's Date: 03/27/2014 Time: [id number] SLP Time Calculation (min): 15 min  Past Medical History:  Past Medical History  Diagnosis Date  . Chronic airway obstruction, not elsewhere classified   . Unspecified chronic bronchitis   . Unspecified essential hypertension   . Non-obstructive CAD   . Palpitations   . Mild aortic stenosis     a. 03/2014 Valve area (VTI): 2.89 cm^2, Valve area (Vmax): 2.7 cm^2.  . Pure hypercholesterolemia   . Morbid obesity   . Esophageal reflux   . Angiodysplasia of intestine (without mention of hemorrhage)   . Diverticulosis of colon (without mention of hemorrhage)   . Benign neoplasm of colon   . Autoimmune hepatitis   . Cystitis, unspecified   . Osteoarthrosis, unspecified whether generalized or localized, unspecified site   . Osteoporosis, unspecified   . Restless legs syndrome (RLS)   . Major depressive disorder, recurrent episode, severe, specified as with psychotic behavior   . Iron deficiency anemia secondary to blood loss (chronic)     a. frequent PRBC transfusions.  . Coarse tremors     a. arms.  . Carpal tunnel syndrome on both sides   . Palpitations   . Chronic diastolic CHF (congestive heart failure)     a. 03/26/2014 Echo: EF 55-60%, no rwma, mild AS/AI, mod-sev Ca2+ MV annulus, mildly to mod dil LA.  [name] Asthma   . OSA (obstructive sleep apnea)     a. does not use CPAP.  [name] Type II diabetes mellitus   . H/O hiatal hernia   . Cirrhosis     a. dx'd 1990's  . Adenomatous colon polyp   . Midsternal chest pain     a. conservatively managed ->poor candidate for cath/anticoagulation.  [name] Anxiety   . History of pneumonia   . AVM (arteriovenous malformation)   . Portal hypertensive gastropathy   . Falls frequently   . UTI (lower urinary tract infection)   . Hiatal hernia   . GAVE (gastric antral vascular ectasia)   .  Nonalcoholic steatohepatitis (NASH)   . Thrombocytopenia    Past Surgical History:  Past Surgical History  Procedure Laterality Date  . Hemorroidectomy    . Appendectomy    . Vaginal hysterectomy    . Breast biopsy      bilateral  . Cesarean section      x 3  . Appendectomy    . Esophagogastroduodenoscopy  06/12/2012    Procedure: ESOPHAGOGASTRODUODENOSCOPY (EGD);  Surgeon: Lafayette Dragon, MD;  Location: [facility];  Service: Endoscopy;  Laterality: N/A;  . Balloon dilation  06/12/2012    Procedure: BALLOON DILATION;  Surgeon: Lafayette Dragon, MD;  Location: [facility];  Service: Endoscopy;  Laterality: N/A;  ?balloon  . Esophagogastroduodenoscopy  09/10/2012    Procedure: ESOPHAGOGASTRODUODENOSCOPY (EGD);  Surgeon: Lafayette Dragon, MD;  Location: [facility];  Service: Endoscopy;  Laterality: N/A;  . Hot hemostasis  09/10/2012    Procedure: HOT HEMOSTASIS (ARGON PLASMA COAGULATION/BICAP);  Surgeon: Lafayette Dragon, MD;  Location: [facility];  Service: Endoscopy;  Laterality: N/A;  . Esophagogastroduodenoscopy N/A 01/18/2013    Procedure: ESOPHAGOGASTRODUODENOSCOPY (EGD);  Surgeon: Lafayette Dragon, MD;  Location: [facility];  Service: Endoscopy;  Laterality: N/A;  . Esophagogastroduodenoscopy N/A 05/24/2013    Procedure: ESOPHAGOGASTRODUODENOSCOPY (EGD);  Surgeon: Jerene Bears, MD;  Location: [facility];  Service: Gastroenterology;  Laterality: N/A;  . Hot  hemostasis N/A 05/24/2013    Procedure: HOT HEMOSTASIS (ARGON PLASMA COAGULATION/BICAP);  Surgeon: Jerene Bears, MD;  Location: [facility];  Service: Gastroenterology;  Laterality: N/A;   HPI:  Pt is a 73 y.o. female with PMH of hypertension, diabetes Mellitus on insulin, chronic diastolic heart failure, obesity, COPD on home oxygen 3 lit/min, major depression with anxiety, psychosis, restless leg syndrome, iron deficiency anemia secondary to chronic blood loss from AVM's, sleep apnea, liver cirrhosis, brought in by her husband for  persistent substernal chest pain since 10 days. Patient reportedly confused and delirious and hollering occasionally. Pt fell in July with sternal fracture and several rib fractures. Chest CT concerning for PNA with patchy atelectasis or infiltrates in both lungs. Bedside swallow eval ordered to r/o aspiration.   Assessment / Plan / Recommendation Clinical Impression  Pt demonstrated no immediate s/s of aspiration; delayed cough x1 during thin liquid trials, and suspect mildly delayed swallow initiation. Pt was resistant to trying cracker but did accept with it crushed in applesauce- mildly prolonged mastication. Daughter at bedside reports that pt does not have hx of dysphagia and has not been getting choked on foods/ liquids. Given these findings, pt risk of aspiration is low-moderate. Risk factors include hx of GERD, decreased cognitive status, weak cough, and current chest CT finding of patchy atelectasis or infiltration in both lungs. Recommend downgrading diet to dysphagia 2, continue thin liquids and meds crushed in puree. Full supervision to cue small bites/ sips and to assist with self-feeding. Have pt sit upright at least 30 minutes after meal. Speech therapy will continue to follow for diet tolerance/ advancement.    Aspiration Risk  Moderate    Diet Recommendation Dysphagia 2 (Fine chop);Thin liquid   Liquid Administration via: Straw Medication Administration: Crushed with puree Supervision: Staff to assist with self feeding;Full supervision/cueing for compensatory strategies Compensations: Slow rate;Small sips/bites Postural Changes and/or Swallow Maneuvers: Seated upright 90 degrees;Upright 30-60 min after meal    Other  Recommendations Oral Care Recommendations: Oral care BID   Follow Up Recommendations  Skilled Nursing facility    Frequency and Duration min 2x/week  2 weeks   Pertinent Vitals/Pain n/a    SLP Swallow Goals     Swallow Study Prior Functional Status   Type of Home: House Available Help at Discharge: Family    General HPI: Pt is a 73 y.o. female with PMH of hypertension, diabetes Mellitus on insulin, chronic diastolic heart failure, obesity, COPD on home oxygen 3 lit/min, major depression with anxiety, psychosis, restless leg syndrome, iron deficiency anemia secondary to chronic blood loss from AVM's, sleep apnea, liver cirrhosis, brought in by her husband for persistent substernal chest pain since 10 days. Patient reportedly confused and delirious and hollering occasionally. Pt fell in July with sternal fracture and several rib fractures. Chest CT concerning for PNA with patchy atelectasis or infiltrates in both lungs. Bedside swallow eval ordered to r/o aspiration. Type of Study: Bedside swallow evaluation Diet Prior to this Study: Regular;Thin liquids Temperature Spikes Noted: Yes Respiratory Status: Nasal cannula History of Recent Intubation: No Behavior/Cognition: Alert;Cooperative;Requires cueing;Confused Oral Cavity - Dentition: Edentulous;Dentures, not available (3 teeth on bottom) Self-Feeding Abilities: Able to feed self;Needs assist Patient Positioning: Upright in chair Baseline Vocal Quality: Clear Volitional Cough: Weak;Other (Comment) (d/t sternum fracture- painful to cough) Volitional Swallow: Unable to elicit    Oral/Motor/Sensory Function Overall Oral Motor/Sensory Function: Appears within functional limits for tasks assessed Labial ROM: Within Functional Limits Labial Symmetry: Within Functional Limits Labial  Strength: Within Functional Limits Lingual ROM: Within Functional Limits Lingual Symmetry: Within Functional Limits Lingual Strength: Within Functional Limits   Ice Chips Ice chips: Not tested   Thin Liquid Thin Liquid: Impaired Presentation: Cup;Straw Pharyngeal  Phase Impairments: Suspected delayed Swallow;Cough - Delayed    Nectar Thick Nectar Thick Liquid: Not tested   Honey Thick Honey Thick Liquid: Not  tested   Puree Puree: Within functional limits Presentation: Spoon   Solid   GO    Solid: Impaired Presentation: Spoon Oral Phase Impairments: Impaired mastication       Amanada Philbrick K, MA, CCC-SLP 03/27/2014,3:00 PM

## 2014-03-27 NOTE — Progress Notes (Signed)
Pt arrived to the unit via wheelchair, 2 person assist to the bed. Pt confused Alert and Oriented to self, husband at the bedside.  02 at 3L. No complaints at this time.

## 2014-03-27 NOTE — Progress Notes (Signed)
Report called to [facility] who will be receiving pt on 3E. Family present and notified of pt transfer.

## 2014-03-27 NOTE — Progress Notes (Addendum)
[facility] PROGRESS NOTE  ROSELAND BRAUN HTX:[id number] DOB: 20-Jun-1941 DOA: 03/25/2014  PCP: Ria Bush, MD  Brief HPI: 73yo with multiple medical problems as noted under PMH presented with worsening chest pain. This is thought to be due to recent sternal fracture sustained as a result of a fall. She was also noted to have ARF, UTI and confusion.  Past medical history:  Past Medical History  Diagnosis Date  . Chronic airway obstruction, not elsewhere classified   . Unspecified chronic bronchitis   . Unspecified essential hypertension   . Non-obstructive CAD   . Palpitations   . Mild aortic stenosis     a. 03/2014 Valve area (VTI): 2.89 cm^2, Valve area (Vmax): 2.7 cm^2.  . Pure hypercholesterolemia   . Morbid obesity   . Esophageal reflux   . Angiodysplasia of intestine (without mention of hemorrhage)   . Diverticulosis of colon (without mention of hemorrhage)   . Benign neoplasm of colon   . Autoimmune hepatitis   . Cystitis, unspecified   . Osteoarthrosis, unspecified whether generalized or localized, unspecified site   . Osteoporosis, unspecified   . Restless legs syndrome (RLS)   . Major depressive disorder, recurrent episode, severe, specified as with psychotic behavior   . Iron deficiency anemia secondary to blood loss (chronic)     a. frequent PRBC transfusions.  . Coarse tremors     a. arms.  . Carpal tunnel syndrome on both sides   . Palpitations   . Chronic diastolic CHF (congestive heart failure)     a. 03/26/2014 Echo: EF 55-60%, no rwma, mild AS/AI, mod-sev Ca2+ MV annulus, mildly to mod dil LA.  [name] Asthma   . OSA (obstructive sleep apnea)     a. does not use CPAP.  [name] Type II diabetes mellitus   . H/O hiatal hernia   . Cirrhosis     a. dx'd 1990's  . Adenomatous colon polyp   . Midsternal chest pain     a. conservatively managed ->poor candidate for cath/anticoagulation.  [name] Anxiety   . History of pneumonia   . AVM (arteriovenous  malformation)   . Portal hypertensive gastropathy   . Falls frequently   . UTI (lower urinary tract infection)   . Hiatal hernia   . GAVE (gastric antral vascular ectasia)   . Nonalcoholic steatohepatitis (NASH)   . Thrombocytopenia     Consultants: Cardiology  Procedures:  2D ECHO 8/1:  Study Conclusions - Left ventricle: The cavity size was normal. Systolic function was normal. The estimated ejection fraction was in the range of 55% to 60%. Wall motion was normal; there were no regional wall motion abnormalities. - Aortic valve: Mildly thickened leaflets. There was very mild stenosis. There was mild regurgitation. Valve area (VTI): 2.89 cm^2. Valve area (Vmax): 2.7 cm^2. - Mitral valve: Moderately to severely calcified annulus. - Left atrium: The atrium was mildly to moderately dilated.  Antibiotics: Levaquin 7/31--> Valtrex 8/1-->  Subjective: Patient yelling. But calms down when spoken to. Much more alert today. Speech is clear as well. Complains of chest pain. Husband is at bedside. He also feels she is improved mentally.  Objective: Vital Signs  Filed Vitals:   03/27/14 0255 03/27/14 0313 03/27/14 0630 03/27/14 0746  BP:  119/52    Pulse:  58    Temp:  98.2 F (36.8 C)    TempSrc:  Axillary    Resp:  16    Height:      Weight:  SpO2: 98% 98% 96% 98%    Intake/Output Summary (Last 24 hours) at 03/27/14 1030 Last data filed at 03/27/14 0800  Gross per 24 hour  Intake 2357.5 ml  Output   1400 ml  Net  957.5 ml   Filed Weights   03/25/14 2300  Weight: 105.2 kg (231 lb 14.8 oz)    General appearance: appears stated age, distracted, morbidly obese and uncooperative Resp: decreased air entry bilaterally. no definite crackles heard. No wheezing. Chest wall: bruising noted over midline with tenderness over sternum Cardio: regular rate and rhythm, S1, S2 normal, systolic murmur: early systolic 3/6, crescendo throughout the precordium, no click, no rub and no  edema GI: obese, slightly distended, BS present. Non tender. no definite masses.  Extremities: bruising noted over anterior left leg. Good ROm bilateral knees and hips. Skin: wet erythema noted under breasts and lower abdo folds. Open blisters noted over left abdominal wall as well. No vesicles. Neurologic: Awake and alert. Tongue is midline. Equal strength bilaterally.   Lab Results:  Basic Metabolic Panel:  Recent Labs Lab 03/20/14 2239 03/25/14 1927 03/25/14 2115 03/26/14 0259 03/26/14 1120 03/27/14 0250  NA 139 130*  --  133* 134* 143  K 5.1 6.1* 5.8* 6.0* 5.7* 4.6  CL 102 97  --  99 100 110  CO2 25 19  --  22 20 22   GLUCOSE 167* 185*  --  143* 195* 141*  BUN 71* 84*  --  86* 81* 75*  CREATININE 1.79* 2.78*  --  2.96* 2.60* 2.05*  CALCIUM 9.1 8.6  --  8.5 8.7 7.8*   Liver Function Tests:  Recent Labs Lab 03/26/14 0055 03/27/14 0250  AST 69* 48*  ALT 29 23  ALKPHOS 101 78  BILITOT 2.4* 1.5*  PROT 6.3 5.2*  ALBUMIN 3.1* 2.4*    Recent Labs Lab 03/26/14 0055  AMMONIA 62*   CBC:  Recent Labs Lab 03/20/14 2239 03/21/14 1029 03/25/14 1927 03/26/14 0259 03/27/14 0250  WBC 3.4*  --  6.5 8.2 5.0  NEUTROABS  --   --  5.7  --   --   HGB 8.3* 8.1* 8.1* 7.5* 8.0*  HCT 24.7*  --  24.5* 23.4* 24.5*  MCV 101.2*  --  103.8* 103.1* 99.6  PLT 74*  --  80* 83* 66*   Cardiac Enzymes:  Recent Labs Lab 03/25/14 1927 03/26/14 0055 03/26/14 0259 03/26/14 1120  TROPONINI 0.55* 0.48* 0.48* 0.45*   BNP (last 3 results)  Recent Labs  10/03/13 0045 01/19/14 1217 03/26/14 0259  PROBNP 207.5* 85.0 3352.0*   CBG:  Recent Labs Lab 03/26/14 1625 03/26/14 2036 03/26/14 2327 03/27/14 0310 03/27/14 0828  GLUCAP 224* 229* 201* 152* 94    Recent Results (from the past 240 hour(s))  URINE CULTURE     Status: None   Collection Time    03/25/14  9:30 PM      Result Value Ref Range Status   Specimen Description URINE, CATHETERIZED   Final   Special Requests  NONE   Final   Culture  Setup Time     Final   Value: 03/26/2014 01:29     Performed at [facility] Count     Final   Value: >=100,000 COLONIES/ML     Performed at [facility]   Culture     Final   Value: ESCHERICHIA COLI     Performed at [facility]   Report Status PENDING  Incomplete  MRSA PCR SCREENING     Status: None   Collection Time    03/26/14  2:04 AM      Result Value Ref Range Status   MRSA by PCR NEGATIVE  NEGATIVE Final   Comment:            The GeneXpert MRSA Assay (FDA     approved for NASAL specimens     only), is one component of a     comprehensive MRSA colonization     surveillance program. It is not     intended to diagnose MRSA     infection nor to guide or     monitor treatment for     MRSA infections.      Studies/Results: Ct Head Wo Contrast  03/26/2014   CLINICAL DATA:  Altered mental status and falls.  EXAM: CT HEAD WITHOUT CONTRAST  TECHNIQUE: Contiguous axial images were obtained from the base of the skull through the vertex without intravenous contrast.  COMPARISON:  04/13/2008  FINDINGS: Images are mildly to moderately degraded by motion artifact. There is no definite evidence of acute large territory infarct, intracranial hemorrhage, mass, midline shift, or extra-axial fluid collection. Small hypodensities are questioned in the thalami, not clearly present on the prior study. There is also have the suggestion of hypoattenuation involving the anterior left temporal lobe. Left choroidal fissure cyst is unchanged. Ventricles and sulci are within normal limits for age.  Orbits are unremarkable. Mastoid air cells are clear. Moderate left sphenoid sinus mucosal thickening is noted. No skull fracture is identified.  IMPRESSION: Motion artifact. No acute intracranial hemorrhage or definite acute large territory infarct. Apparent hypoattenuation in the anterior left temporal lobe is favored to be artifactual, however if there is  concern for acute infarct consider further evaluation with brain MRI. Small foci of decreased density in the thalami may be artifactual or reflect lacunar infarcts of indeterminate age.   Electronically Signed   By: Logan Bores   On: 03/26/2014 09:06   Ct Chest Wo Contrast  03/25/2014   CLINICAL DATA:  Patient fell on 07/28 and was diagnosed with a sternal fracture. Patient is having increased pain since then.  EXAM: CT CHEST WITHOUT CONTRAST  TECHNIQUE: Multidetector CT imaging of the chest was performed following the standard protocol without IV contrast.  COMPARISON:  Sternum 03/22/2014  FINDINGS: The study is technically limited due to motion artifact and streak artifact arising from the patient's hands crossed across the chest. Focal cortical irregularity is suggested in the mid sternum corresponding to nondisplaced fracture on prior plain films. Visualization of this area is limited due to artifact. There is minimal retrosternal soft tissue hematoma without significant fluid collection in the anterior mediastinum. A few prominent lymph nodes are demonstrated in the anterior mediastinum, likely reactive.  Cardiac enlargement. Coronary artery calcifications. Normal caliber thoracic aorta with calcification. Pulmonary vascularity is somewhat prominent and congestive changes are not excluded. Esophagus is decompressed. But no pleural effusions. Evaluation of lungs is limited due to respiratory motion artifact. There appears to be atelectasis in the lung bases particularly on the right. Patchy atelectasis or infiltration in the right middle lobe and left lingula. No focal consolidation or collapse. No pneumothorax. Visualized portions of the upper abdominal organs demonstrate incomplete visualization of the liver and spleen but there appears to be a cirrhotic configuration to the liver and the spleen appears enlarged. Normal alignment of the thoracic vertebrae with degenerative changes present. Acute appearing  fracture of the  right lateral ninth rib. Additional old right rib fractures with callus formation are also demonstrated. No displaced fractures are identified.  IMPRESSION: Non depressed sternal fracture with small associated hematoma. Aorta is normal in caliber and no abnormal mediastinal fluid collections are suggested. Nondisplaced acute fracture of the right lateral ninth rib. Additional old right rib fractures. Patchy atelectasis or infiltration in both lungs. Technically limited study due to motion artifact.   Electronically Signed   By: Lucienne Capers M.D.   On: 03/25/2014 21:18   US Abdomen Limited  03/26/2014   CLINICAL DATA:  Abdominal distention, evaluate for ascites  EXAM: LIMITED ABDOMEN ULTRASOUND FOR ASCITES  TECHNIQUE: Limited ultrasound survey for ascites was performed in all four abdominal quadrants.  COMPARISON:  Prior abdominal ultrasound 02/02/2013; prior CT abdomen/pelvis 06/02/2007  FINDINGS: Negative for ascites.  The spleen is markedly enlarged measuring 17.8 x 16.5 x 7.8 cm yielding a calculated volume of 1200 cubic cm. Partial imaging of the liver demonstrates increased echogenicity and heterogeneity of the parenchyma. The liver contour appears slightly nodular.  Decompressed bladder with Foley catheter in place.  IMPRESSION: 1. Negative for ascites. 2. Incomplete imaging of the liver demonstrates findings concerning for underlying cirrhosis. 3. Marked splenomegaly perhaps secondary to portal hypertension.   Electronically Signed   By: Jacqulynn Cadet M.D.   On: 03/26/2014 07:42    Medications:  Scheduled: . buPROPion  150 mg Oral Daily  . insulin aspart  0-9 Units Subcutaneous Q4H  . insulin glargine  80 Units Subcutaneous QHS  . ipratropium  0.5 mg Nebulization Q6H  . lactulose  20 g Oral BID  . levalbuterol  0.63 mg Nebulization Q6H  . [START ON 03/28/2014] levofloxacin (LEVAQUIN) IV  750 mg Intravenous Q48H  . mometasone-formoterol  2 puff Inhalation BID  . nystatin    Topical TID  . pantoprazole  40 mg Oral Daily  . QUEtiapine  400 mg Oral QHS  . rifaximin  550 mg Oral Daily  . rOPINIRole  4 mg Oral QHS  . simvastatin  40 mg Oral Daily  . sodium chloride  3 mL Intravenous Q12H  . valACYclovir  1,000 mg Oral Daily   Continuous: . sodium chloride 100 mL/hr at 03/27/14 0110   RKY:[id number], acetaminophen, ALPRAZolam, haloperidol lactate, hydrALAZINE, morphine injection, ondansetron (ZOFRAN) IV, ondansetron, oxyCODONE  Assessment/Plan:  Active Problems:   IRON DEFICIENCY ANEMIA SECONDARY TO BLOOD LOSS   CAD   COPD   GAVE (gastric antral vascular ectasia)   Chronic diastolic CHF (congestive heart failure)   Physical deconditioning   Cirrhosis of liver without mention of alcohol   Chronic respiratory failure   Acute kidney injury   AKI (acute kidney injury)   Hyperkalemia   Elevated troponin   Hyponatremia   UTI (lower urinary tract infection)   Acute encephalopathy    Chest wall Pain  Most likely due to sternal fracture sustained as a result of a fall. Small associated hematoma also noted. Pain control.   Mildly Elevated troponin and Questionable new onset Atrial fibrillation Could have had cardiac contusion or could be due to renal failure. EKG not definitive for A fib. Cards has read as sinus. ECHO report as above. Normal systolic function without wall motion abnormalities. Appreciate cards input. She was seen by cardiology in June and is apparently not a candidate for invasive procedures because of her h/o GI bleed.    Acute on CKD stage 3 with Hyperkalemia Possibly from dehydration and decreased po intake. Improving on IVF.  Good UO. Continue IVF but at lower rate. Potassium is normal now.   Fever and Abnormal UA/UTI Fever most likely due to UTI. On Levaquin. Urine and blood cultures pending. Monitor for now. CXR raised possibility of infiltrates as well.  Acute Hypoxic Respiratory failure and questionable CAP/COPD on Home  O2 Continue O2. Continue Abx and inhaled steroids. No further doses of steroids for now. ABG reviewed.  Acute encephalopathy Much better. Multifactorial due to pain, UTI, hypoxia, ARF. Due to falls CT head was obtained which shows non specific changes. Since she is clinically improved no need to pursue MRI. Also noted to have elevated Ammonia. Continue Lactulose. Haldol PRN. Repeat Ammonia in AM. Aspiration precautions.  Hyponatremia Improved. Possibly from dehydyration. Stopped spironolactone and lasix. On IVF.   Chronic Anemia and thrombocytopenia This is chronic due to chronic GI bleed. Hgb slightly lower today. Due to hypoxia, elevated trop she was transfused PRBC. Hgb not much better. No active bleeding. Will not transfuse today. Monitor platelet counts closely.  Diabetes Mellitus Type 2 Her last hgba1c from 7/28 is 5.3. Continue current doses of Lantus and SSI.   Transient Hypotension  Resolved with fluids. Holding all bp medications and lasix, spironolactone.   Chronic diastolic heart failure Appears to be compensated. No fluid overload. No pedal edema. Caution with fluids.   History of NASH/Liver cirrhosis and splenomegaly/Hepatic Encephalopathy Continue Lactulose and rifaximin. Ammonia level noted to be elevated and will be repeated.  Code Status: Limited Code. No CPR, Intubation or shocks. DVT Prophylaxis: SCD's    Family Communication: Discussed with husband at bedside  Disposition Plan: She can transferred to tele floor. OOB. PT/OT.    LOS: 2 days   [facility] Pager [phone] 03/27/2014, 10:30 AM  If 8PM-8AM, please contact night-coverage at [email or web address], password [email or web address]   Disclaimer: This note was dictated with voice recognition software. Similar sounding words can inadvertently be transcribed and may not be corrected upon review.

## 2014-03-28 ENCOUNTER — Inpatient Hospital Stay (HOSPITAL_COMMUNITY): Payer: Medicare Other

## 2014-03-28 DIAGNOSIS — R0989 Other specified symptoms and signs involving the circulatory and respiratory systems: Secondary | ICD-10-CM

## 2014-03-28 DIAGNOSIS — R0609 Other forms of dyspnea: Secondary | ICD-10-CM

## 2014-03-28 DIAGNOSIS — R071 Chest pain on breathing: Secondary | ICD-10-CM

## 2014-03-28 LAB — GLUCOSE, CAPILLARY
GLUCOSE-CAPILLARY: 182 mg/dL — AB (ref 70–99)
GLUCOSE-CAPILLARY: 117 mg/dL — AB (ref 70–99)
Glucose-Capillary: 130 mg/dL — ABNORMAL HIGH (ref 70–99)
Glucose-Capillary: 147 mg/dL — ABNORMAL HIGH (ref 70–99)
Glucose-Capillary: 169 mg/dL — ABNORMAL HIGH (ref 70–99)
Glucose-Capillary: 183 mg/dL — ABNORMAL HIGH (ref 70–99)
Glucose-Capillary: 184 mg/dL — ABNORMAL HIGH (ref 70–99)

## 2014-03-28 LAB — COMPREHENSIVE METABOLIC PANEL
ALT: 26 U/L (ref 0–35)
ANION GAP: 13 (ref 5–15)
AST: 37 U/L (ref 0–37)
Albumin: 2.8 g/dL — ABNORMAL LOW (ref 3.5–5.2)
Alkaline Phosphatase: 95 U/L (ref 39–117)
BUN: 57 mg/dL — AB (ref 6–23)
CO2: 23 meq/L (ref 19–32)
Calcium: 8.8 mg/dL (ref 8.4–10.5)
Chloride: 110 mEq/L (ref 96–112)
Creatinine, Ser: 1.45 mg/dL — ABNORMAL HIGH (ref 0.50–1.10)
GFR calc non Af Amer: 36 mL/min — ABNORMAL LOW (ref >90)
GFR, EST AFRICAN AMERICAN: 41 mL/min — AB (ref >90)
GLUCOSE: 133 mg/dL — AB (ref 70–99)
POTASSIUM: 4.8 meq/L (ref 3.7–5.3)
SODIUM: 146 meq/L (ref 137–147)
Total Bilirubin: 1.4 mg/dL — ABNORMAL HIGH (ref 0.3–1.2)
Total Protein: 6.4 g/dL (ref 6.0–8.3)

## 2014-03-28 LAB — CBC
HEMATOCRIT: 28.8 % — AB (ref 36.0–46.0)
Hemoglobin: 9.2 g/dL — ABNORMAL LOW (ref 12.0–15.0)
MCH: 32.3 pg (ref 26.0–34.0)
MCHC: 31.9 g/dL (ref 30.0–36.0)
MCV: 101.1 fL — ABNORMAL HIGH (ref 78.0–100.0)
Platelets: 84 10*3/uL — ABNORMAL LOW (ref 150–400)
RBC: 2.85 MIL/uL — AB (ref 3.87–5.11)
RDW: 19.6 % — ABNORMAL HIGH (ref 11.5–15.5)
WBC: 6.6 10*3/uL (ref 4.0–10.5)

## 2014-03-28 LAB — URINE CULTURE: Colony Count: >=100000

## 2014-03-28 LAB — LEGIONELLA ANTIGEN, URINE: Legionella Antigen, Urine: NEGATIVE

## 2014-03-28 LAB — AMMONIA: AMMONIA: 27 umol/L (ref 11–60)

## 2014-03-28 MED ORDER — INSULIN ASPART 100 UNIT/ML ~~LOC~~ SOLN
0.0000 [IU] | Freq: Every day | SUBCUTANEOUS | Status: DC
  Administered 2014-03-29: 2 [IU] via SUBCUTANEOUS
  Administered 2014-03-30: 3 [IU] via SUBCUTANEOUS

## 2014-03-28 MED ORDER — NADOLOL 20 MG PO TABS
10.0000 mg | ORAL_TABLET | Freq: Every day | ORAL | Status: DC
  Administered 2014-03-28 – 2014-03-29 (×2): 10 mg via ORAL
  Administered 2014-03-30: 10:00:00 via ORAL
  Administered 2014-03-31: 10 mg via ORAL
  Filled 2014-03-28 (×4): qty 1

## 2014-03-28 MED ORDER — CEFPODOXIME PROXETIL 200 MG PO TABS
200.0000 mg | ORAL_TABLET | Freq: Two times a day (BID) | ORAL | Status: DC
  Administered 2014-03-28 – 2014-03-31 (×7): 200 mg via ORAL
  Filled 2014-03-28 (×8): qty 1

## 2014-03-28 MED ORDER — INSULIN ASPART 100 UNIT/ML ~~LOC~~ SOLN
0.0000 [IU] | Freq: Three times a day (TID) | SUBCUTANEOUS | Status: DC
  Administered 2014-03-28 – 2014-03-29 (×2): 4 [IU] via SUBCUTANEOUS
  Administered 2014-03-29 (×2): 3 [IU] via SUBCUTANEOUS
  Administered 2014-03-30: 7 [IU] via SUBCUTANEOUS
  Administered 2014-03-30: 12:00:00 via SUBCUTANEOUS
  Administered 2014-03-30: 15 [IU] via SUBCUTANEOUS
  Administered 2014-03-31: 11 [IU] via SUBCUTANEOUS

## 2014-03-28 MED ORDER — AZITHROMYCIN 500 MG PO TABS
500.0000 mg | ORAL_TABLET | ORAL | Status: AC
  Administered 2014-03-28 – 2014-03-29 (×2): 500 mg via ORAL
  Filled 2014-03-28 (×2): qty 1

## 2014-03-28 NOTE — Progress Notes (Signed)
Physical Therapy Treatment Patient Details Name: [name] MRN: [id number] DOB: July 24, 1941 Today's Date: 03/28/2014    History of Present Illness 73 y.o. female brought in by her husband for AMS and persistent substernal chest pain since she fell 10 days ago resulting in sternal and rib fractures. + UTI  PMHx- hypertension, diabetes Mellitus on insulin, chronic diastolic heart failure, obesity, COPD on home oxygen 3 lit/min, major depression with anxiety, psychosis, restless leg syndrome, sleep apnea, liver cirrhosis,     PT Comments    Pt remains confused (far from her baseline cognition per her neighbor/caregiver present in room). Pt oriented to person, place, and situation (time not tested). Activity limited by desaturation on 3L O2 and pt fatigue. Neighbor feels she and husband can physically assist pt, however she hopes her cognition is improved before going home.    Follow Up Recommendations  Home health PT;Supervision/Assistance - 24 hour     Equipment Recommendations  None recommended by PT    Recommendations for Other Services       Precautions / Restrictions Precautions Precautions: Fall Precaution Comments: h/o multiple falls recently Restrictions Weight Bearing Restrictions: No    Mobility  Bed Mobility Overal bed mobility: Needs Assistance;+2 for physical assistance Bed Mobility: Supine to Sit;Sit to Sidelying     Supine to sit: Min assist;HOB elevated   Sit to sidelying: Mod assist;+2 for physical assistance General bed mobility comments: pt requires verbal and tactile cues to stay on task; very little help required to raise her torso; HOB 20; on return to bed, required instructional cues for sit to side and assist to raise her legs  Transfers Overall transfer level: Needs assistance Equipment used: Rolling walker (2 wheeled) Transfers: Sit to/from [facility] Sit to Stand: Min assist Stand pivot transfers: Min assist       General  transfer comment: max directional v/c's, assist for safe hand placement/walker management  Ambulation/Gait Ambulation/Gait assistance: Min assist;+2 physical assistance;+2 safety/equipment Ambulation Distance (Feet): 17 Feet Assistive device: Rolling walker (2 wheeled) Gait Pattern/deviations: Step-to pattern;Decreased stride length;Trunk flexed Gait velocity: slow   General Gait Details: pt continued to say "let's go!" when asked if she wanted to continue walking or needed to sit down, ultimately required PT to tell her to turn around and shortly after she asked to sit down with chair brought to her   Stairs            Wheelchair Mobility    Modified Rankin (Stroke Patients Only)       Balance                                    Cognition Arousal/Alertness: Awake/alert Behavior During Therapy: Anxious;WFL for tasks assessed/performed (became anxious as treatment progressed) Overall Cognitive Status: Impaired/Different from baseline Area of Impairment: Safety/judgement;Attention;Awareness   Current Attention Level: Sustained   Following Commands: Follows one step commands with increased time Safety/Judgement: Decreased awareness of safety;Decreased awareness of deficits Awareness: Emergent Problem Solving: Slow processing;Decreased initiation;Requires verbal cues;Requires tactile cues      Exercises      General Comments General comments (skin integrity, edema, etc.): pt was up in chair from ~9:00 am to 1400 when nursing put her back to bed. Pt very willing to get up and walk. Her neighbor/caregiver was present during session and states pt's cognition is better, but far from her baseline      Pertinent Vitals/Pain  3L O2 93% on RA at rest, 84% on 3L with walking; 97% at rest on 3L after resting 2 minutes Sternal pain on sit to side (pt cried out, unable to rate)     Home Living                      Prior Function            PT Goals  (current goals can now be found in the care plan section) Acute Rehab PT Goals Patient Stated Goal: none stated Progress towards PT goals: Progressing toward goals    Frequency  Min 3X/week    PT Plan Current plan remains appropriate    Co-evaluation             End of Session Equipment Utilized During Treatment: Gait belt;Oxygen Activity Tolerance: Patient limited by fatigue;Treatment limited secondary to medical complications (Comment) (desaturation on 3L O2) Patient left: with family/visitor present;in bed;with bed alarm set     Time: 1338-1410 PT Time Calculation (min): 32 min  Charges:  $Gait Training: 8-22 mins $Therapeutic Activity: 8-22 mins                    G Codes:      Naya Ilagan 15-Apr-2014, 2:42 PM Pager [phone]

## 2014-03-28 NOTE — Progress Notes (Signed)
Patient continues to be agitated and yelling out even after receiving morphine, xanax and other scheduled medications.  PRN Haldol given. Will continue to monitor. [name]

## 2014-03-28 NOTE — Clinical Documentation Improvement (Signed)
Medicare rules require specification as to whether an inpatient diagnosis was present at the time of admission.    Patient spiked temp of 103, BP109/65, P 68 with no elevation in WBC 24 hours into admission, CT scan of chest on admit showed "Patchy atelectasis or infiltration in both lungs".   Thank you  Please clarify if either of the following diagnoses Pneumonia vs Sepsis was:       Present at the time of admission   NOT present at the time of inpatient admission and it developed during the inpatient stay   Unable to clinically determine whether the condition was present on admission.   Documentation insufficient to determine if condition was present at the time of inpatient admission  Thank You, Ree Kida ,RN Clinical Documentation Specialist:  [facility]

## 2014-03-28 NOTE — Progress Notes (Signed)
Occupational Therapy Evaluation Patient Details Name: [name] MRN: [id number] DOB: 04-30-1941 Today's Date: 03/28/2014    History of Present Illness 73 y.o. female with prior h/o hypertension, diabetes Mellitus on insulin, chronic diastolic heart failure, obesity, COPD on home oxygen 3 lit/min, major depression with anxiety, psychosis, restless leg syndrome, sleep apnea, liver cirrhosis, brought in by her husband for persistent substernal chest pain since 10 days. Patient is confused and delirious and hollering occasionally.Per husband she fell 10 days ago , had a sternal fracture and a few rib fractures. She was seen in ED few days ago and sent home on oxycodone. She was brought back for persistent pain.    Clinical Impression   Unsure of PLOF. Pt staes she was independent with ADL and mobility and that her husband works. Pt requires Max A for ADL. Desat to 85 on 3L during toilet transfer only. Required > 1 min to rebound to 93. HR 71. Pt occasionally "yelling out" during session. Unintelligible at times. At this time, feel pt will benefit from rehab at SNF to return to PLOF. Pt will benefit from skilled OT services to facilitate D/C to next venue due to below deficits.    Follow Up Recommendations  SNF;Supervision/Assistance - 24 hour    Equipment Recommendations  3 in 1 bedside comode    Recommendations for Other Services       Precautions / Restrictions Precautions Precautions: Fall Restrictions Weight Bearing Restrictions: No      Mobility Bed Mobility Overal bed mobility: Needs Assistance;+2 for physical assistance Bed Mobility: Supine to Sit     Supine to sit: Mod assist     General bed mobility comments: assist to elevate trunk off bed from elevated HOB  Transfers Overall transfer level: Needs assistance Equipment used: Rolling walker (2 wheeled)   Sit to Stand: Min assist;+2 safety/equipment Stand pivot transfers: Min assist;+2 safety/equipment        General transfer comment: max directional v/c's, assist for safe hand placement/walker management    Balance     Sitting balance-Leahy Scale: Fair       Standing balance-Leahy Scale: Poor                              ADL Overall ADL's : Needs assistance/impaired Eating/Feeding: Supervision/ safety;Set up (full S)   Grooming: Cueing for sequencing;Cueing for safety;Sitting;Wash/dry face;Oral care;Minimal assistance   Upper Body Bathing: Moderate assistance;Sitting;Cueing for sequencing   Lower Body Bathing: Maximal assistance;Sit to/from stand   Upper Body Dressing : Moderate assistance;Sitting   Lower Body Dressing: Maximal assistance;Sit to/from stand   Toilet Transfer: Minimal Scientist, forensic Details (indicate cue type and reason): desat during transer         Functional mobility during ADLs: Minimal assistance;Cueing for safety;Cueing for sequencing General ADL Comments: Transferred from bed to chair. Pt required mod vc to problem solve how to get to chair. Desat. mod vc to attend to simple ADL     Vision                     Perception     Praxis      Pertinent Vitals/Pain O2 85 3L after activity; 93 at rest. HR 78 during activity. 71 at rest     Hand Dominance Right   Extremity/Trunk Assessment Upper Extremity Assessment Upper Extremity Assessment: Generalized weakness   Lower Extremity Assessment Lower Extremity Assessment: Generalized weakness   Cervical /  Trunk Assessment Cervical / Trunk Assessment: Kyphotic   Communication Communication Communication: Expressive difficulties (unintelligible at times)   Cognition Arousal/Alertness: Awake/alert Behavior During Therapy: Anxious Overall Cognitive Status: Impaired/Different from baseline Area of Impairment: Safety/judgement;Following commands;Attention;Awareness;Problem solving   Current Attention Level: Sustained   Following Commands: Follows one  step commands with increased time Safety/Judgement: Decreased awareness of safety Awareness: Emergent Problem Solving: Slow processing;Decreased initiation;Difficulty sequencing;Requires verbal cues General Comments: participating with OT but yelling out for" water" and various people's names   General Comments       Exercises       Shoulder Instructions      Home Living Family/patient expects to be discharged to:: Private residence Living Arrangements: Spouse/significant other Available Help at Discharge: Family Type of Home: House Home Access: Stairs to enter Technical brewer of Steps: 3 Entrance Stairs-Rails: Right Home Layout: One level     Bathroom Shower/Tub: Teacher, early years/pre: Standard Bathroom Accessibility: Yes   Home Equipment: Environmental consultant - 4 wheels   Additional Comments: Home O2 (3LO2; unsure of information regarding bathroom - given by pt)      Prior Functioning/Environment Level of Independence: Needs assistance  Gait / Transfers Assistance Needed: uses rollator ADL's / Homemaking Assistance Needed: spouse assist with bathing at EOB and transfer to/from toliet.   Comments: spouse reports his neighbor stays with pt t/o the day while he is at work    OT Diagnosis: Generalized weakness;Cognitive deficits;Acute pain   OT Problem List: Decreased strength;Decreased activity tolerance;Decreased cognition;Decreased safety awareness;Decreased knowledge of use of DME or AE;Cardiopulmonary status limiting activity;Obesity;Pain   OT Treatment/Interventions: Self-care/ADL training;Therapeutic exercise;Energy conservation;DME and/or AE instruction;Therapeutic activities;Cognitive remediation/compensation;Balance training;Patient/family education    OT Goals(Current goals can be found in the care plan section) Acute Rehab OT Goals Patient Stated Goal: none stated OT Goal Formulation: Patient unable to participate in goal setting Time For Goal  Achievement: 04/11/14 Potential to Achieve Goals: Good  OT Frequency: Min 2X/week   Barriers to D/C: Decreased caregiver support (pt states husband works)          Pharmacist, hospital During Treatment: Teacher, adult education Communication: Mobility status;Other (comment) (O2 SATS)  Activity Tolerance: Patient tolerated treatment well Patient left: in chair;with call bell/phone within reach;with chair alarm set;Other (comment) (with RT)   Time: 4098-1191 OT Time Calculation (min): 25 min Charges:  OT General Charges $OT Visit: 1 Procedure OT Evaluation $Initial OT Evaluation Tier I: 1 Procedure OT Treatments $Self Care/Home Management : 8-22 mins G-Codes:    Anakaren Campion,HILLARY 24-Apr-2014, 9:35 AM   Maurie Boettcher, OTR/L  [phone] 04/24/2014

## 2014-03-28 NOTE — Progress Notes (Signed)
Patient confused, agitated and yelling out. Refused to take lactulose. Will continue to monitor. Tresa Endo

## 2014-03-28 NOTE — Progress Notes (Signed)
[facility] PROGRESS NOTE  [name]:[id number] DOB: 04-02-1941 DOA: 03/25/2014  PCP: Ria Bush, MD  Brief HPI: 73yo with multiple medical problems as noted under PMH presented with worsening chest pain. This is thought to be due to recent sternal fracture sustained as a result of a fall. She was also noted to have ARF, UTI and confusion.  Past medical history:  Past Medical History  Diagnosis Date  . Chronic airway obstruction, not elsewhere classified   . Unspecified chronic bronchitis   . Unspecified essential hypertension   . Non-obstructive CAD   . Palpitations   . Mild aortic stenosis     a. 03/2014 Valve area (VTI): 2.89 cm^2, Valve area (Vmax): 2.7 cm^2.  . Pure hypercholesterolemia   . Morbid obesity   . Esophageal reflux   . Angiodysplasia of intestine (without mention of hemorrhage)   . Diverticulosis of colon (without mention of hemorrhage)   . Benign neoplasm of colon   . Autoimmune hepatitis   . Cystitis, unspecified   . Osteoarthrosis, unspecified whether generalized or localized, unspecified site   . Osteoporosis, unspecified   . Restless legs syndrome (RLS)   . Major depressive disorder, recurrent episode, severe, specified as with psychotic behavior   . Iron deficiency anemia secondary to blood loss (chronic)     a. frequent PRBC transfusions.  . Coarse tremors     a. arms.  . Carpal tunnel syndrome on both sides   . Palpitations   . Chronic diastolic CHF (congestive heart failure)     a. 03/26/2014 Echo: EF 55-60%, no rwma, mild AS/AI, mod-sev Ca2+ MV annulus, mildly to mod dil LA.  [name] Asthma   . OSA (obstructive sleep apnea)     a. does not use CPAP.  [name] Type II diabetes mellitus   . H/O hiatal hernia   . Cirrhosis     a. dx'd 1990's  . Adenomatous colon polyp   . Midsternal chest pain     a. conservatively managed ->poor candidate for cath/anticoagulation.  [name] Anxiety   . History of pneumonia   . AVM (arteriovenous  malformation)   . Portal hypertensive gastropathy   . Falls frequently   . UTI (lower urinary tract infection)   . Hiatal hernia   . GAVE (gastric antral vascular ectasia)   . Nonalcoholic steatohepatitis (NASH)   . Thrombocytopenia     Consultants: Cardiology  Procedures:  2D ECHO 8/1:  Study Conclusions - Left ventricle: The cavity size was normal. Systolic function was normal. The estimated ejection fraction was in the range of 55% to 60%. Wall motion was normal; there were no regional wall motion abnormalities. - Aortic valve: Mildly thickened leaflets. There was very mild stenosis. There was mild regurgitation. Valve area (VTI): 2.89 cm^2. Valve area (Vmax): 2.7 cm^2. - Mitral valve: Moderately to severely calcified annulus. - Left atrium: The atrium was mildly to moderately dilated.  Antibiotics: Levaquin 7/31-->8/3 Valtrex 8/1--> Vantin 8/3-->  Subjective: Patient looks much better. Still with significant pain over chest. Mental status is close to baselin.e per family.   Objective: Vital Signs  Filed Vitals:   03/27/14 2133 03/28/14 0206 03/28/14 0642 03/28/14 0923  BP: 156/68 129/56 130/52   Pulse: 63 72 74   Temp: 99.7 F (37.6 C) 99 F (37.2 C) 99.4 F (37.4 C)   TempSrc: Oral Oral Oral   Resp: 22 20 20    Height:      Weight:   104.781 kg (231 lb)  SpO2: 96% 96% 95% 93%    Intake/Output Summary (Last 24 hours) at 03/28/14 1318 Last data filed at 03/28/14 1000  Gross per 24 hour  Intake    363 ml  Output   2500 ml  Net  -2137 ml   Filed Weights   03/25/14 2300 03/27/14 1218 03/28/14 0642  Weight: 105.2 kg (231 lb 14.8 oz) 106.006 kg (233 lb 11.2 oz) 104.781 kg (231 lb)    General appearance: appears stated age, distracted, morbidly obese and uncooperative Resp: decreased air entry bilaterally. no definite crackles heard. Occasional wheezing. Chest wall: bruising noted over midline with tenderness over sternum Cardio: regular rate and rhythm, S1,  S2 normal, systolic murmur: early systolic 3/6, crescendo throughout the precordium, no click, no rub and no edema GI: obese, slightly distended, BS present. Non tender. no definite masses.  Skin: improved erythema under breasts and lower abdo folds. Open blisters noted over left abdominal wall healing as well. No vesicles. Neurologic: Awake and alert. Tongue is midline. Equal strength bilaterally.   Lab Results:  Basic Metabolic Panel:  Recent Labs Lab 03/25/14 1927 03/25/14 2115 03/26/14 0259 03/26/14 1120 03/27/14 0250 03/28/14 0331  NA 130*  --  133* 134* 143 146  K 6.1* 5.8* 6.0* 5.7* 4.6 4.8  CL 97  --  99 100 110 110  CO2 19  --  22 20 22 23   GLUCOSE 185*  --  143* 195* 141* 133*  BUN 84*  --  86* 81* 75* 57*  CREATININE 2.78*  --  2.96* 2.60* 2.05* 1.45*  CALCIUM 8.6  --  8.5 8.7 7.8* 8.8   Liver Function Tests:  Recent Labs Lab 03/26/14 0055 03/27/14 0250 03/28/14 0331  AST 69* 48* 37  ALT 29 23 26   ALKPHOS 101 78 95  BILITOT 2.4* 1.5* 1.4*  PROT 6.3 5.2* 6.4  ALBUMIN 3.1* 2.4* 2.8*    Recent Labs Lab 03/26/14 0055 03/28/14 0331  AMMONIA 62* 27   CBC:  Recent Labs Lab 03/25/14 1927 03/26/14 0259 03/27/14 0250 03/28/14 0331  WBC 6.5 8.2 5.0 6.6  NEUTROABS 5.7  --   --   --   HGB 8.1* 7.5* 8.0* 9.2*  HCT 24.5* 23.4* 24.5* 28.8*  MCV 103.8* 103.1* 99.6 101.1*  PLT 80* 83* 66* 84*   Cardiac Enzymes:  Recent Labs Lab 03/25/14 1927 03/26/14 0055 03/26/14 0259 03/26/14 1120  TROPONINI 0.55* 0.48* 0.48* 0.45*   BNP (last 3 results)  Recent Labs  10/03/13 0045 01/19/14 1217 03/26/14 0259  PROBNP 207.5* 85.0 3352.0*   CBG:  Recent Labs Lab 03/27/14 1954 03/28/14 0003 03/28/14 0419 03/28/14 0749 03/28/14 1050  GLUCAP 167* 147* 117* 130* 184*    Recent Results (from the past 240 hour(s))  URINE CULTURE     Status: None   Collection Time    03/25/14  9:30 PM      Result Value Ref Range Status   Specimen Description URINE,  CATHETERIZED   Final   Special Requests NONE   Final   Culture  Setup Time     Final   Value: 03/26/2014 01:29     Performed at [facility]     Final   Value: >=100,000 COLONIES/ML     Performed at [facility]   Culture     Final   Value: ESCHERICHIA COLI     Performed at [facility]   Report Status 03/28/2014 FINAL   Final  Organism ID, Bacteria ESCHERICHIA COLI   Final  URINE CULTURE     Status: None   Collection Time    03/26/14  1:18 AM      Result Value Ref Range Status   Specimen Description URINE, RANDOM   Final   Special Requests NONE   Final   Culture  Setup Time     Final   Value: 03/26/2014 12:35     Performed at [facility]     Final   Value: >=100,000 COLONIES/ML     Performed at [facility]   Culture     Final   Value: ESCHERICHIA COLI     Performed at [facility]   Report Status 03/28/2014 FINAL   Final   Organism ID, Bacteria ESCHERICHIA COLI   Final  MRSA PCR SCREENING     Status: None   Collection Time    03/26/14  2:04 AM      Result Value Ref Range Status   MRSA by PCR NEGATIVE  NEGATIVE Final   Comment:            The GeneXpert MRSA Assay (FDA     approved for NASAL specimens     only), is one component of a     comprehensive MRSA colonization     surveillance program. It is not     intended to diagnose MRSA     infection nor to guide or     monitor treatment for     MRSA infections.  CULTURE, BLOOD (ROUTINE X 2)     Status: None   Collection Time    03/26/14  5:40 AM      Result Value Ref Range Status   Specimen Description BLOOD LEFT FOREARM   Final   Special Requests BOTTLES DRAWN AEROBIC AND ANAEROBIC 10CC EACH   Final   Culture  Setup Time     Final   Value: 03/26/2014 11:55     Performed at [facility]   Culture     Final   Value:        BLOOD CULTURE RECEIVED NO GROWTH TO DATE CULTURE WILL BE HELD FOR 5 DAYS BEFORE ISSUING A FINAL NEGATIVE  REPORT     Performed at [facility]   Report Status PENDING   Incomplete  CULTURE, BLOOD (ROUTINE X 2)     Status: None   Collection Time    03/26/14  5:50 AM      Result Value Ref Range Status   Specimen Description BLOOD LEFT HAND   Final   Special Requests BOTTLES DRAWN AEROBIC AND ANAEROBIC 10CC EACH   Final   Culture  Setup Time     Final   Value: 03/26/2014 11:56     Performed at [facility]   Culture     Final   Value:        BLOOD CULTURE RECEIVED NO GROWTH TO DATE CULTURE WILL BE HELD FOR 5 DAYS BEFORE ISSUING A FINAL NEGATIVE REPORT     Performed at [facility]   Report Status PENDING   Incomplete      Studies/Results: No results found.  Medications:  Scheduled: . buPROPion  150 mg Oral Daily  . cefpodoxime  200 mg Oral Q12H  . insulin aspart  0-20 Units Subcutaneous TID WC  . insulin aspart  0-5 Units Subcutaneous QHS  . insulin glargine  80 Units Subcutaneous QHS  .  ipratropium  0.5 mg Nebulization TID  . lactulose  20 g Oral BID  . levalbuterol  0.63 mg Nebulization TID  . mometasone-formoterol  2 puff Inhalation BID  . nystatin   Topical TID  . pantoprazole  40 mg Oral Daily  . QUEtiapine  400 mg Oral QHS  . rifaximin  550 mg Oral Daily  . rOPINIRole  4 mg Oral QHS  . simvastatin  40 mg Oral Daily  . sodium chloride  3 mL Intravenous Q12H  . valACYclovir  1,000 mg Oral Daily   Continuous: . sodium chloride 75 mL/hr at 03/27/14 1150   FYT:[id number], acetaminophen, ALPRAZolam, haloperidol lactate, hydrALAZINE, morphine injection, ondansetron (ZOFRAN) IV, ondansetron, oxyCODONE  Assessment/Plan:  Active Problems:   IRON DEFICIENCY ANEMIA SECONDARY TO BLOOD LOSS   CAD   COPD   GAVE (gastric antral vascular ectasia)   Chronic diastolic CHF (congestive heart failure)   Physical deconditioning   Cirrhosis of liver without mention of alcohol   Chronic respiratory failure   Acute kidney injury   AKI (acute kidney injury)    Hyperkalemia   Elevated troponin   Hyponatremia   UTI (lower urinary tract infection)   Acute encephalopathy   CAP (community acquired pneumonia)    Chest wall Pain  Most likely due to sternal fracture sustained as a result of a fall. Small associated hematoma also noted on CT. Pain control.   Mildly Elevated troponin and Questionable new onset Atrial fibrillation Could have had cardiac contusion or could be due to renal failure. EKG not definitive for A fib. Cards has read as sinus. ECHO report as above. Normal systolic function without wall motion abnormalities. Appreciate cards input. She was seen by cardiology in June and is apparently not a candidate for invasive procedures because of her h/o GI bleed. No further work up.  Acute on CKD stage 3 with Hyperkalemia Possibly from dehydration and decreased po intake. Renal function back to baseline. Good UO. KVO IVF. Potassium is normal now.   E coli UTI Fever most likely due to UTI. Urine growing E coli resistant to Cipro. Sensitive to Ceftriaxone. She has tolerated Keflex in past. Will change to oral Vantin.  Blood cultures negative so far. CXR raised possibility of infiltrates as well.  Acute Hypoxic Respiratory failure and questionable CAP/COPD on Home O2 Continue O2. Continue Abx and inhaled steroids. No further doses of steroids for now. ABG reviewed. Pneumonia was likely present at admission considering Ct findings. Since she is wheezing will repeat CXR.  Acute encephalopathy Much better. Multifactorial due to pain, UTI, hypoxia, ARF. Due to falls CT head was obtained which shows non specific changes. Since she is clinically improved no need to pursue MRI. Also noted to have elevated Ammonia. Continue Lactulose. Haldol PRN. Repeat Ammonia is normal. Aspiration precautions.  Hyponatremia Improved. Possibly from dehydyration. Stopped spironolactone and lasix.    Chronic Anemia and thrombocytopenia This is chronic due to chronic GI  bleed. Hgb slightly lower today. Due to hypoxia, elevated trop she was transfused PRBC. Hgb not much better but stable. No active bleeding. Monitor platelet counts closely.  Diabetes Mellitus Type 2 Her last hgba1c from 7/28 is 5.3. Continue current doses of Lantus and SSI.   Transient Hypotension  Resolved with fluids. BP now improved. Ok to resume nadolol. Holding ACEI and diuretics for now.   Chronic diastolic heart failure Appears to be compensated. No fluid overload. No pedal edema. Caution with fluids.   History of NASH/Liver cirrhosis and splenomegaly/Hepatic  Encephalopathy Continue Lactulose and rifaximin. Ammonia level noted to be elevated initially. Normal now. Resume nadolol.  Code Status: Limited Code. No CPR, Intubation or shocks. DVT Prophylaxis: SCD's    Family Communication: Discussed with daughter and other family at bedside  Disposition Plan: [facility]. PT/OT recommends SNF. Discussed with family and they will discuss further. Consult CSW    LOS: 3 days   [facility] Pager [phone] 03/28/2014, 1:18 PM  If 8PM-8AM, please contact night-coverage at [email or web address], password [email or web address]   Disclaimer: This note was dictated with voice recognition software. Similar sounding words can inadvertently be transcribed and may not be corrected upon review.

## 2014-03-28 NOTE — Progress Notes (Signed)
Patient remains confused.  Currently active with [facility].  Has been receiving visits from our nurse practitioner for chronic disease management.  Will continue to monitor her for her disposition.  Of note, [facility] services does not replace or interfere with any services that are arranged by inpatient case management or social work.  For additional questions or referrals please contact Corliss Blacker BSN RN [facility] Liaison at [phone].

## 2014-03-28 NOTE — Progress Notes (Signed)
Speech Language Pathology Treatment: Dysphagia  Patient Details Name: [name] MRN: [id number] DOB: 1940-10-08 Today's Date: 03/28/2014 Time: 5170-0174 SLP Time Calculation (min): 17 min  Assessment / Plan / Recommendation Clinical Impression  Suspected primary esophageal dysphagia, with burping after almost every sip of water, and complaint of pain (points to sternal area). Pt has persistent substernal chest pain since falling and suffering sternal and rib fractures.  Pt also has a h/o GERD.  ? If aspirating reflux.  A bedside swallow eval cannot r/o aspiration, but pt's body habitus prevents MBS.  It is unlikely at this point that pt would cooperate with FEES due to MS. There are no overt s/s of aspiration with thin liquids at bedside.  Swallow appears to be timely (difficult to palpate laryngeal elevation due to body habitus), no cough or throat clearing after swallows and voice remains clear.       HPI HPI: Pt is a 73 y.o. female with PMH of hypertension, diabetes Mellitus on insulin, chronic diastolic heart failure, obesity, COPD on home oxygen 3 lit/min, major depression with anxiety, psychosis, restless leg syndrome, iron deficiency anemia secondary to chronic blood loss from AVM's, sleep apnea, liver cirrhosis, brought in by her husband for persistent substernal chest pain since 10 days. Patient reportedly confused and delirious and hollering occasionally. Pt fell in July with sternal fracture and several rib fractures. Chest CT concerning for PNA with patchy atelectasis or infiltrates in both lungs. Bedside swallow eval ordered to r/o aspiration.   Pertinent Vitals Low grade fever; CXR: RLL atx.  SLP Plan  Continue with current plan of care    Recommendations Diet recommendations: Dysphagia 2 (fine chop);Thin liquid Liquids provided via: Straw Medication Administration: Crushed with puree Supervision: Staff to assist with self feeding;Full supervision/cueing for compensatory  strategies Compensations: Slow rate;Small sips/bites Postural Changes and/or Swallow Maneuvers: Seated upright 90 degrees;Upright 30-60 min after meal              Oral Care Recommendations: Oral care BID Follow up Recommendations: Skilled Nursing facility Plan: Continue with current plan of care    GO     Quinn Axe T 03/28/2014, 4:01 PM

## 2014-03-28 NOTE — Progress Notes (Addendum)
Patient Name: OBELIA BONELLO Date of Encounter: 03/28/2014     Active Problems:   IRON DEFICIENCY ANEMIA SECONDARY TO BLOOD LOSS   CAD   COPD   GAVE (gastric antral vascular ectasia)   Chronic diastolic CHF (congestive heart failure)   Physical deconditioning   Cirrhosis of liver without mention of alcohol   Chronic respiratory failure   Acute kidney injury   AKI (acute kidney injury)   Hyperkalemia   Elevated troponin   Hyponatremia   UTI (lower urinary tract infection)   Acute encephalopathy   CAP (community acquired pneumonia)    SUBJECTIVE  Yelling about ice chips. Talking to her self. She reports ongoing chest pain and SOB.   CURRENT MEDS . buPROPion  150 mg Oral Daily  . insulin aspart  0-9 Units Subcutaneous Q4H  . insulin glargine  80 Units Subcutaneous QHS  . ipratropium  0.5 mg Nebulization TID  . lactulose  20 g Oral BID  . levalbuterol  0.63 mg Nebulization TID  . levofloxacin (LEVAQUIN) IV  750 mg Intravenous Q48H  . mometasone-formoterol  2 puff Inhalation BID  . nystatin   Topical TID  . pantoprazole  40 mg Oral Daily  . QUEtiapine  400 mg Oral QHS  . rifaximin  550 mg Oral Daily  . rOPINIRole  4 mg Oral QHS  . simvastatin  40 mg Oral Daily  . sodium chloride  3 mL Intravenous Q12H  . valACYclovir  1,000 mg Oral Daily    OBJECTIVE  Filed Vitals:   03/27/14 2039 03/27/14 2133 03/28/14 0206 03/28/14 0642  BP:  156/68 129/56 130/52  Pulse:  63 72 74  Temp:  99.7 F (37.6 C) 99 F (37.2 C) 99.4 F (37.4 C)  TempSrc:  Oral Oral Oral  Resp:  22 20 20   Height:      Weight:    231 lb (104.781 kg)  SpO2: 96% 96% 96% 95%    Intake/Output Summary (Last 24 hours) at 03/28/14 0834 Last data filed at 03/28/14 0643  Gross per 24 hour  Intake 591.67 ml  Output   3000 ml  Net -2408.33 ml   Filed Weights   03/25/14 2300 03/27/14 1218 03/28/14 0642  Weight: 231 lb 14.8 oz (105.2 kg) 233 lb 11.2 oz (106.006 kg) 231 lb (104.781 kg)    PHYSICAL  EXAM  General: Pleasant, NAD  Psych: Normal affect.  Neuro: Alert and oriented X to person/place. Moves all extremities spontaneously. Follows commands.  HEENT: Normal  Neck: Supple without bruits. Obese, difficult to assess JVP.  Lungs: Resp regular and unlabored, diminished breath sounds bilat R>L.  Heart: RRR no s3, s4, or murmurs. Chest wall is very tender. C/P worse with deep breathing.  Abdomen: Soft, non-tender, non-distended, BS + x 4.  Extremities: No clubbing, cyanosis or edema. DP/PT/Radials 2+ and equal bilaterally.   Accessory Clinical Findings  CBC  Recent Labs  03/25/14 1927  03/27/14 0250 03/28/14 0331  WBC 6.5  < > 5.0 6.6  NEUTROABS 5.7  --   --   --   HGB 8.1*  < > 8.0* 9.2*  HCT 24.5*  < > 24.5* 28.8*  MCV 103.8*  < > 99.6 101.1*  PLT 80*  < > 66* 84*  < > = values in this interval not displayed. Basic Metabolic Panel  Recent Labs  03/27/14 0250 03/28/14 0331  NA 143 146  K 4.6 4.8  CL 110 110  CO2 22 23  GLUCOSE  141* 133*  BUN 75* 57*  CREATININE 2.05* 1.45*  CALCIUM 7.8* 8.8   Liver Function Tests  Recent Labs  03/27/14 0250 03/28/14 0331  AST 48* 37  ALT 23 26  ALKPHOS 78 95  BILITOT 1.5* 1.4*  PROT 5.2* 6.4  ALBUMIN 2.4* 2.8*    Cardiac Enzymes  Recent Labs  03/26/14 0055 03/26/14 0259 03/26/14 1120  TROPONINI 0.48* 0.48* 0.45*    Thyroid Function Tests  Recent Labs  03/26/14 0259  TSH 1.150    TELE  1st AV block   Radiology/Studies  Dg Sternum  03/22/2014   CLINICAL DATA:  Chest pain  EXAM: STERNUM - 2+ VIEW  COMPARISON:  12/27/2013  FINDINGS: Mild cortical irregularity is noted in the mid sternum which is new from the prior exam. This is consistent with a mildly displaced cortical fracture. No other focal abnormality is seen.  IMPRESSION: Mid sternal fracture   Electronically Signed   By: Inez Catalina M.D.   On: 03/22/2014 13:15   Ct Head Wo Contrast  03/26/2014   CLINICAL DATA:  Altered mental status and  falls.  EXAM: CT HEAD WITHOUT CONTRAST  TECHNIQUE: Contiguous axial images were obtained from the base of the skull through the vertex without intravenous contrast.  COMPARISON:  04/13/2008  FINDINGS: Images are mildly to moderately degraded by motion artifact. There is no definite evidence of acute large territory infarct, intracranial hemorrhage, mass, midline shift, or extra-axial fluid collection. Small hypodensities are questioned in the thalami, not clearly present on the prior study. There is also have the suggestion of hypoattenuation involving the anterior left temporal lobe. Left choroidal fissure cyst is unchanged. Ventricles and sulci are within normal limits for age.  Orbits are unremarkable. Mastoid air cells are clear. Moderate left sphenoid sinus mucosal thickening is noted. No skull fracture is identified.  IMPRESSION: Motion artifact. No acute intracranial hemorrhage or definite acute large territory infarct. Apparent hypoattenuation in the anterior left temporal lobe is favored to be artifactual, however if there is concern for acute infarct consider further evaluation with brain MRI. Small foci of decreased density in the thalami may be artifactual or reflect lacunar infarcts of indeterminate age.   Electronically Signed   By: Logan Bores   On: 03/26/2014 09:06   Ct Chest Wo Contrast  03/25/2014   CLINICAL DATA:  Patient fell on 07/28 and was diagnosed with a sternal fracture. Patient is having increased pain since then.  EXAM: CT CHEST WITHOUT CONTRAST  TECHNIQUE: Multidetector CT imaging of the chest was performed following the standard protocol without IV contrast.  COMPARISON:  Sternum 03/22/2014  FINDINGS: The study is technically limited due to motion artifact and streak artifact arising from the patient's hands crossed across the chest. Focal cortical irregularity is suggested in the mid sternum corresponding to nondisplaced fracture on prior plain films. Visualization of this area is  limited due to artifact. There is minimal retrosternal soft tissue hematoma without significant fluid collection in the anterior mediastinum. A few prominent lymph nodes are demonstrated in the anterior mediastinum, likely reactive.  Cardiac enlargement. Coronary artery calcifications. Normal caliber thoracic aorta with calcification. Pulmonary vascularity is somewhat prominent and congestive changes are not excluded. Esophagus is decompressed. But no pleural effusions. Evaluation of lungs is limited due to respiratory motion artifact. There appears to be atelectasis in the lung bases particularly on the right. Patchy atelectasis or infiltration in the right middle lobe and left lingula. No focal consolidation or collapse. No pneumothorax. Visualized  portions of the upper abdominal organs demonstrate incomplete visualization of the liver and spleen but there appears to be a cirrhotic configuration to the liver and the spleen appears enlarged. Normal alignment of the thoracic vertebrae with degenerative changes present. Acute appearing fracture of the right lateral ninth rib. Additional old right rib fractures with callus formation are also demonstrated. No displaced fractures are identified.  IMPRESSION: Non depressed sternal fracture with small associated hematoma. Aorta is normal in caliber and no abnormal mediastinal fluid collections are suggested. Nondisplaced acute fracture of the right lateral ninth rib. Additional old right rib fractures. Patchy atelectasis or infiltration in both lungs. Technically limited study due to motion artifact.   Electronically Signed   By: Lucienne Capers M.D.   On: 03/25/2014 21:18   US Abdomen Limited  03/26/2014   CLINICAL DATA:  Abdominal distention, evaluate for ascites  EXAM: LIMITED ABDOMEN ULTRASOUND FOR ASCITES  TECHNIQUE: Limited ultrasound survey for ascites was performed in all four abdominal quadrants.  COMPARISON:  Prior abdominal ultrasound 02/02/2013; prior CT  abdomen/pelvis 06/02/2007  FINDINGS: Negative for ascites.  The spleen is markedly enlarged measuring 17.8 x 16.5 x 7.8 cm yielding a calculated volume of 1200 cubic cm. Partial imaging of the liver demonstrates increased echogenicity and heterogeneity of the parenchyma. The liver contour appears slightly nodular.  Decompressed bladder with Foley catheter in place.  IMPRESSION: 1. Negative for ascites. 2. Incomplete imaging of the liver demonstrates findings concerning for underlying cirrhosis. 3. Marked splenomegaly perhaps secondary to portal hypertension.   Electronically Signed   By: Jacqulynn Cadet M.D.   On: 03/26/2014 07:42    ASSESSMENT AND PLAN  73 y/o female with h/o hepatic cirrhosis/NASH, chronic GI blood loss and anemia req frequent transfusions, DM, OSA, COPD, non-obstructive CAD, and atypical chest pain, who was admitted 7/31 with persistent c/p following fall 10 days prior to admission and has been found to have a sternal fx. Cardiology asked to evaluated 2/2 elevated troponin.  Elevated troponin: Pt presented with a 10 day h/o chest pain, following fall, and has been found to have a sternal fx. ECG non-acute however troponin mildly elevated with peak of 0.55 (initial troponin this admission). Trend has been flat and downward - last was 0.45.  -- Echo this admission showed nl LV fxn w/o regional WMA's.  -- Troponin elevation and chest pain are likely secondary to cardiac contusion (vs demand ischemia due to marked anemia).  -- It has previously been outlined by Dr. Burt Knack that she is a poor candidate for further ischemic evaluation secondary to chronic GI blood loss/anemia thrombocytopenia, and inability to use anticoagulation.  -- Cont conservative therapy including statin therapy.   Sternal Fracture: Worsening c/p over past 10 days since fall. Cont to have significant c/p that is worse with deep breathing and palpation over sternum. No evidence of significant pericardial effusion or  tamponade on echo. Pain mgmt per IM.   Acute on chronic stage III kidney dzs: creat sl improved today.   UTI: Abx per IM.   COPD: On abx/inhalers/steroids/O2.   Anemia/Pancytopenia with chronic GI blood loss: Req frequent transfusions as an outpt.  Per IM.   NASH/Liver cirrhosis and splenomegaly/Hepatic Encephalopathy: Lactulose and rifaximin per IM.   Chronic diastolic CHF: Nl EF by echo this AM. Volume stable. Diuretics on hold in setting of AKI  Hyperkalemia: Resolving. Arlyce Harman on hold. Follow.  Cardiology will likely sign off- MD to see.   Tyrell Antonio PA-C  Pager [phone]  Patient seen and examined. Agree with assessment and plan. Getting a breathing treatment. No chest pain. Mild sternal fracture. Renal function improved with hydration and holding diuretics. Macrocytic indices. Nl EF on echo without regional wall motion abnormalities. Cardiac stable presently; Will sign off.   Troy Sine, MD, [facility] 03/28/2014 9:26 AM

## 2014-03-28 NOTE — Care Management Note (Addendum)
    Page 1 of 1   03/30/2014     4:03:23 PM CARE MANAGEMENT NOTE 03/30/2014  Patient:  [name], [name]   Account Number:  [id number]  Date Initiated:  03/28/2014  Documentation initiated by:  [facility]  Subjective/Objective Assessment:   73 yo female.//Chief Complaint: brought in by her husband for worsening and persistent chest pain.     Action/Plan:   Pain control, hydration.// Access for [facility] services   Anticipated DC Date:  03/29/2014   Anticipated DC Plan:  [place]  CM consult      Choice offered to / List presented to:          [facility] arranged  [place] RN  [facility] DISEASE MANAGEMENT      [place].   Status of service:  In process, will continue to follow Medicare Important Message given?  YES (If response is "NO", the following Medicare IM given date fields will be blank) Date Medicare IM given:  03/28/2014 Medicare IM given by:  [facility] Date Additional Medicare IM given:  03/30/2014 Additional Medicare IM given by:  Iviana Blasingame  Discharge Disposition:    Per UR Regulation:  Reviewed for med. necessity/level of care/duration of stay  If discussed at [place] of Stay Meetings, dates discussed:    Comments:  03/30/14 1200 Zeya Balles J. Clydene Laming, RN, BSN, [employer] [phone] Spoke with pt at bedside regarding discharge planning for [facility]. Offered pt list of home health agencies to choose from.  Pt chose [facility] to render services.Janae Sauce, RN of [facility] notified.  No DME needs identified at this time.  03/28/14 0940 Fuller Mandril, RN, BSN, NCM [phone] Copy of orignal signed IM given to patient.

## 2014-03-29 ENCOUNTER — Inpatient Hospital Stay (HOSPITAL_COMMUNITY): Payer: Medicare Other

## 2014-03-29 DIAGNOSIS — R1310 Dysphagia, unspecified: Secondary | ICD-10-CM

## 2014-03-29 DIAGNOSIS — J189 Pneumonia, unspecified organism: Secondary | ICD-10-CM

## 2014-03-29 LAB — CBC
HEMATOCRIT: 27.6 % — AB (ref 36.0–46.0)
HEMOGLOBIN: 8.7 g/dL — AB (ref 12.0–15.0)
MCH: 31.8 pg (ref 26.0–34.0)
MCHC: 31.5 g/dL (ref 30.0–36.0)
MCV: 100.7 fL — AB (ref 78.0–100.0)
Platelets: 81 10*3/uL — ABNORMAL LOW (ref 150–400)
RBC: 2.74 MIL/uL — ABNORMAL LOW (ref 3.87–5.11)
RDW: 18.2 % — ABNORMAL HIGH (ref 11.5–15.5)
WBC: 6.9 10*3/uL (ref 4.0–10.5)

## 2014-03-29 LAB — BASIC METABOLIC PANEL
Anion gap: 11 (ref 5–15)
BUN: 48 mg/dL — AB (ref 6–23)
CALCIUM: 8.6 mg/dL (ref 8.4–10.5)
CO2: 23 mEq/L (ref 19–32)
CREATININE: 1.35 mg/dL — AB (ref 0.50–1.10)
Chloride: 105 mEq/L (ref 96–112)
GFR calc Af Amer: 45 mL/min — ABNORMAL LOW (ref >90)
GFR, EST NON AFRICAN AMERICAN: 39 mL/min — AB (ref >90)
GLUCOSE: 135 mg/dL — AB (ref 70–99)
POTASSIUM: 4.6 meq/L (ref 3.7–5.3)
Sodium: 139 mEq/L (ref 137–147)

## 2014-03-29 LAB — GLUCOSE, CAPILLARY
GLUCOSE-CAPILLARY: 328 mg/dL — AB (ref 70–99)
Glucose-Capillary: 130 mg/dL — ABNORMAL HIGH (ref 70–99)
Glucose-Capillary: 123 mg/dL — ABNORMAL HIGH (ref 70–99)
Glucose-Capillary: 158 mg/dL — ABNORMAL HIGH (ref 70–99)

## 2014-03-29 MED ORDER — LEVALBUTEROL HCL 0.63 MG/3ML IN NEBU: 0.6300 mg | INHALATION_SOLUTION | RESPIRATORY_TRACT | Status: DC | PRN

## 2014-03-29 MED ORDER — PANTOPRAZOLE SODIUM 40 MG PO TBEC
40.0000 mg | DELAYED_RELEASE_TABLET | Freq: Two times a day (BID) | ORAL | Status: DC
  Administered 2014-03-29 – 2014-03-31 (×4): 40 mg via ORAL
  Filled 2014-03-29 (×3): qty 1

## 2014-03-29 MED ORDER — PREDNISONE 50 MG PO TABS
60.0000 mg | ORAL_TABLET | Freq: Every day | ORAL | Status: DC
  Administered 2014-03-29 – 2014-03-30 (×2): 60 mg via ORAL
  Filled 2014-03-29 (×3): qty 1

## 2014-03-29 NOTE — Progress Notes (Signed)
[facility] PROGRESS NOTE  [name]:[id number] DOB: 05/13/1941 DOA: 03/25/2014  PCP: Ria Bush, MD  Brief HPI: 73yo with multiple medical problems as noted under PMH presented with worsening chest pain. This is thought to be due to recent sternal fracture sustained as a result of a fall. She was also noted to have ARF, UTI and confusion.  Past medical history:  Past Medical History  Diagnosis Date  . Chronic airway obstruction, not elsewhere classified   . Unspecified chronic bronchitis   . Unspecified essential hypertension   . Non-obstructive CAD   . Palpitations   . Mild aortic stenosis     a. 03/2014 Valve area (VTI): 2.89 cm^2, Valve area (Vmax): 2.7 cm^2.  . Pure hypercholesterolemia   . Morbid obesity   . Esophageal reflux   . Angiodysplasia of intestine (without mention of hemorrhage)   . Diverticulosis of colon (without mention of hemorrhage)   . Benign neoplasm of colon   . Autoimmune hepatitis   . Cystitis, unspecified   . Osteoarthrosis, unspecified whether generalized or localized, unspecified site   . Osteoporosis, unspecified   . Restless legs syndrome (RLS)   . Major depressive disorder, recurrent episode, severe, specified as with psychotic behavior   . Iron deficiency anemia secondary to blood loss (chronic)     a. frequent PRBC transfusions.  . Coarse tremors     a. arms.  . Carpal tunnel syndrome on both sides   . Palpitations   . Chronic diastolic CHF (congestive heart failure)     a. 03/26/2014 Echo: EF 55-60%, no rwma, mild AS/AI, mod-sev Ca2+ MV annulus, mildly to mod dil LA.  [name] Asthma   . OSA (obstructive sleep apnea)     a. does not use CPAP.  [name] Type II diabetes mellitus   . H/O hiatal hernia   . Cirrhosis     a. dx'd 1990's  . Adenomatous colon polyp   . Midsternal chest pain     a. conservatively managed ->poor candidate for cath/anticoagulation.  [name] Anxiety   . History of pneumonia   . AVM (arteriovenous  malformation)   . Portal hypertensive gastropathy   . Falls frequently   . UTI (lower urinary tract infection)   . Hiatal hernia   . GAVE (gastric antral vascular ectasia)   . Nonalcoholic steatohepatitis (NASH)   . Thrombocytopenia     Consultants: Cardiology  Procedures:  2D ECHO 8/1:  Study Conclusions - Left ventricle: The cavity size was normal. Systolic function was normal. The estimated ejection fraction was in the range of 55% to 60%. Wall motion was normal; there were no regional wall motion abnormalities. - Aortic valve: Mildly thickened leaflets. There was very mild stenosis. There was mild regurgitation. Valve area (VTI): 2.89 cm^2. Valve area (Vmax): 2.7 cm^2. - Mitral valve: Moderately to severely calcified annulus. - Left atrium: The atrium was mildly to moderately dilated.  Antibiotics: Levaquin 7/31-->8/3 Valtrex 8/1--> Vantin 8/3--> Azithromycin 8/3-->8/5  Subjective: Patient continues to improve. She does state she is short of breath and has been coughing up green expectoration. Chest still hurts. Mental status is close to baseline per family.   Objective: Vital Signs  Filed Vitals:   03/29/14 0148 03/29/14 0330 03/29/14 0637 03/29/14 0739  BP: 124/58  115/59   Pulse: 60  64   Temp: 100.5 F (38.1 C)  98.5 F (36.9 C)   TempSrc: Oral  Oral   Resp: 20  20   Height:  Weight:  109.181 kg (240 lb 11.2 oz)    SpO2: 98%  100% 99%    Intake/Output Summary (Last 24 hours) at 03/29/14 1247 Last data filed at 03/29/14 1037  Gross per 24 hour  Intake    973 ml  Output   1310 ml  Net   -337 ml   Filed Weights   03/27/14 1218 03/28/14 0642 03/29/14 0330  Weight: 106.006 kg (233 lb 11.2 oz) 104.781 kg (231 lb) 109.181 kg (240 lb 11.2 oz)    General appearance: appears stated age, distracted, morbidly obese and uncooperative Resp: decreased air entry bilaterally. crackles heard at bases right more than left.  Chest wall: bruising noted over midline  with tenderness over sternum Cardio: regular rate and rhythm, S1, S2 normal, systolic murmur: early systolic 3/6, crescendo throughout the precordium, no click, no rub and no edema GI: obese, slightly distended, BS present. Non tender. no definite masses.  Skin: improved erythema under breasts and lower abdo folds. Open blisters noted over left abdominal wall healing as well. No vesicles. Neurologic: Awake and alert. Tongue is midline. Equal strength bilaterally.   Lab Results:  Basic Metabolic Panel:  Recent Labs Lab 03/26/14 0259 03/26/14 1120 03/27/14 0250 03/28/14 0331 03/29/14 0502  NA 133* 134* 143 146 139  K 6.0* 5.7* 4.6 4.8 4.6  CL 99 100 110 110 105  CO2 22 20 22 23 23   GLUCOSE 143* 195* 141* 133* 135*  BUN 86* 81* 75* 57* 48*  CREATININE 2.96* 2.60* 2.05* 1.45* 1.35*  CALCIUM 8.5 8.7 7.8* 8.8 8.6   Liver Function Tests:  Recent Labs Lab 03/26/14 0055 03/27/14 0250 03/28/14 0331  AST 69* 48* 37  ALT 29 23 26   ALKPHOS 101 78 95  BILITOT 2.4* 1.5* 1.4*  PROT 6.3 5.2* 6.4  ALBUMIN 3.1* 2.4* 2.8*    Recent Labs Lab 03/26/14 0055 03/28/14 0331  AMMONIA 62* 27   CBC:  Recent Labs Lab 03/25/14 1927 03/26/14 0259 03/27/14 0250 03/28/14 0331 03/29/14 0502  WBC 6.5 8.2 5.0 6.6 6.9  NEUTROABS 5.7  --   --   --   --   HGB 8.1* 7.5* 8.0* 9.2* 8.7*  HCT 24.5* 23.4* 24.5* 28.8* 27.6*  MCV 103.8* 103.1* 99.6 101.1* 100.7*  PLT 80* 83* 66* 84* 81*   Cardiac Enzymes:  Recent Labs Lab 03/25/14 1927 03/26/14 0055 03/26/14 0259 03/26/14 1120  TROPONINI 0.55* 0.48* 0.48* 0.45*   BNP (last 3 results)  Recent Labs  10/03/13 0045 01/19/14 1217 03/26/14 0259  PROBNP 207.5* 85.0 3352.0*   CBG:  Recent Labs Lab 03/28/14 0749 03/28/14 1050 03/28/14 1656 03/28/14 2144 03/29/14 0626  GLUCAP 130* 184* 183* 169* 130*    Recent Results (from the past 240 hour(s))  URINE CULTURE     Status: None   Collection Time    03/25/14  9:30 PM       Result Value Ref Range Status   Specimen Description URINE, CATHETERIZED   Final   Special Requests NONE   Final   Culture  Setup Time     Final   Value: 03/26/2014 01:29     Performed at [place]     Final   Value: >=100,000 COLONIES/ML     Performed at [facility]   Culture     Final   Value: ESCHERICHIA COLI     Performed at [facility]   Report Status 03/28/2014 FINAL   Final  Organism ID, Bacteria ESCHERICHIA COLI   Final  URINE CULTURE     Status: None   Collection Time    03/26/14  1:18 AM      Result Value Ref Range Status   Specimen Description URINE, RANDOM   Final   Special Requests NONE   Final   Culture  Setup Time     Final   Value: 03/26/2014 12:35     Performed at [place]     Final   Value: >=100,000 COLONIES/ML     Performed at [facility]   Culture     Final   Value: ESCHERICHIA COLI     Performed at [facility]   Report Status 03/28/2014 FINAL   Final   Organism ID, Bacteria ESCHERICHIA COLI   Final  MRSA PCR SCREENING     Status: None   Collection Time    03/26/14  2:04 AM      Result Value Ref Range Status   MRSA by PCR NEGATIVE  NEGATIVE Final   Comment:            The GeneXpert MRSA Assay (FDA     approved for NASAL specimens     only), is one component of a     comprehensive MRSA colonization     surveillance program. It is not     intended to diagnose MRSA     infection nor to guide or     monitor treatment for     MRSA infections.  CULTURE, BLOOD (ROUTINE X 2)     Status: None   Collection Time    03/26/14  5:40 AM      Result Value Ref Range Status   Specimen Description BLOOD LEFT FOREARM   Final   Special Requests BOTTLES DRAWN AEROBIC AND ANAEROBIC 10CC EACH   Final   Culture  Setup Time     Final   Value: 03/26/2014 11:55     Performed at [facility]   Culture     Final   Value:        BLOOD CULTURE RECEIVED NO GROWTH TO DATE  CULTURE WILL BE HELD FOR 5 DAYS BEFORE ISSUING A FINAL NEGATIVE REPORT     Performed at [facility]   Report Status PENDING   Incomplete  CULTURE, BLOOD (ROUTINE X 2)     Status: None   Collection Time    03/26/14  5:50 AM      Result Value Ref Range Status   Specimen Description BLOOD LEFT HAND   Final   Special Requests BOTTLES DRAWN AEROBIC AND ANAEROBIC 10CC EACH   Final   Culture  Setup Time     Final   Value: 03/26/2014 11:56     Performed at [facility]   Culture     Final   Value:        BLOOD CULTURE RECEIVED NO GROWTH TO DATE CULTURE WILL BE HELD FOR 5 DAYS BEFORE ISSUING A FINAL NEGATIVE REPORT     Performed at [facility]   Report Status PENDING   Incomplete      Studies/Results: Dg Chest Port 1 View  03/28/2014   CLINICAL DATA:  Cough and wheezing  EXAM: PORTABLE CHEST - 1 VIEW  COMPARISON:  Chest radiograph March 22, 2014 and chest CT March 25, 2014  FINDINGS: There is airspace consolidation in the right perihilar and lower lobe regions, increased. There  is mild interstitial edema in the mid and lower lung zones as well. Heart is enlarged. The pulmonary vascularity reflects underlying pulmonary arterial hypertension. No pneumothorax. No adenopathy is appreciable, although the prominence of the main pulmonary arteries could mask mild right hilar adenopathy.  IMPRESSION: Infiltrate on the right involving portions of the right perihilar and lower lobe regions. Underlying degree of congestive heart failure. No pneumothorax. Evidence of pulmonary arterial hypertension.   Electronically Signed   By: Lowella Grip M.D.   On: 03/28/2014 15:45    Medications:  Scheduled: . azithromycin  500 mg Oral Q24H  . buPROPion  150 mg Oral Daily  . cefpodoxime  200 mg Oral Q12H  . insulin aspart  0-20 Units Subcutaneous TID WC  . insulin aspart  0-5 Units Subcutaneous QHS  . insulin glargine  80 Units Subcutaneous QHS  . ipratropium  0.5 mg Nebulization TID  .  lactulose  20 g Oral BID  . levalbuterol  0.63 mg Nebulization TID  . mometasone-formoterol  2 puff Inhalation BID  . nadolol  10 mg Oral Daily  . nystatin   Topical TID  . pantoprazole  40 mg Oral BID  . predniSONE  60 mg Oral Q breakfast  . QUEtiapine  400 mg Oral QHS  . rifaximin  550 mg Oral Daily  . rOPINIRole  4 mg Oral QHS  . simvastatin  40 mg Oral Daily  . sodium chloride  3 mL Intravenous Q12H  . valACYclovir  1,000 mg Oral Daily   Continuous: . sodium chloride 10 mL/hr at 03/29/14 0107   HUT:[id number], acetaminophen, ALPRAZolam, haloperidol lactate, hydrALAZINE, levalbuterol, morphine injection, ondansetron (ZOFRAN) IV, ondansetron, oxyCODONE  Assessment/Plan:  Active Problems:   IRON DEFICIENCY ANEMIA SECONDARY TO BLOOD LOSS   CAD   COPD   GAVE (gastric antral vascular ectasia)   Chronic diastolic CHF (congestive heart failure)   Physical deconditioning   Cirrhosis of liver without mention of alcohol   Chronic respiratory failure   Acute kidney injury   AKI (acute kidney injury)   Hyperkalemia   Elevated troponin   Hyponatremia   UTI (lower urinary tract infection)   Acute encephalopathy   CAP (community acquired pneumonia)    Chest wall Pain  Most likely due to sternal fracture sustained as a result of a fall. Small associated hematoma also noted on CT. Pain control.   Mildly Elevated troponin and Questionable new onset Atrial fibrillation Could have had cardiac contusion or could be due to renal failure. EKG not definitive for A fib. Cards has read as sinus. ECHO report as above. Normal systolic function without wall motion abnormalities. Appreciate cards input. She was seen by cardiology in June and is apparently not a candidate for invasive procedures because of her h/o GI bleed. No further work up.  Acute on CKD stage 3 with Hyperkalemia Possibly from dehydration and decreased po intake. Renal function back to baseline. Good UO. KVO IVF. Potassium  is normal now.   E coli UTI Fever most likely due to UTI. Urine growing E coli resistant to Cipro. Sensitive to Ceftriaxone. She has tolerated Keflex in past. Will change to oral Vantin.  Blood cultures negative so far. CXR raised possibility of infiltrates as well.  Acute Hypoxic Respiratory failure and questionable CAP/COPD on Home O2 Continue O2. Continue Abx and inhaled steroids. Pneumonia was likely present at admission considering Ct findings. Repeat CXR shows infiltrate in right lung. It is possible she could have aspirated when she was encephalopathic.  Continue current antibiotics. Might benefit from short course of steroids considering her COPD. Will initiate Prednisone.   Acute encephalopathy Much better. Multifactorial due to pain, UTI, hypoxia, ARF. Due to falls CT head was obtained which shows non specific changes. Since she is clinically improved no need to pursue MRI. Also noted to have elevated Ammonia. Continue Lactulose. Haldol PRN. Repeat Ammonia is normal. Aspiration precautions.  Likely esophageal Dysphagia Probably from GERD. Could try Barium swallow to rule out strictures. Her respiratory status precludes endoscopy at this time. Change to PPI twice daily.  Hyponatremia Improved. Possibly from dehydyration. Stopped spironolactone and lasix.    Chronic Anemia and thrombocytopenia This is chronic due to chronic GI bleed. Hgb slightly lower today. Due to hypoxia, elevated trop she was transfused PRBC. Hgb not much better but stable. No active bleeding. Monitor platelet counts closely.  Diabetes Mellitus Type 2 Her last hgba1c from 7/28 is 5.3. Continue current doses of Lantus and SSI.   Transient Hypotension  Resolved with fluids. BP now improved. Ok to resume nadolol. Holding ACEI and diuretics for now.   Chronic diastolic heart failure Appears to be compensated. No fluid overload. No pedal edema. Caution with fluids.   History of NASH/Liver cirrhosis and  splenomegaly/Hepatic Encephalopathy Continue Lactulose and rifaximin. Ammonia level noted to be elevated initially. Normal now. Resume nadolol.  Code Status: Limited Code. No CPR, Intubation or shocks. DVT Prophylaxis: SCD's    Family Communication: Discussed with patient  Disposition Plan: OOB. PT now thinks she can go home with home health with 24 hour supervision. Will continue with PT. Not yet ready for discharge.    LOS: 4 days   [facility] Pager [phone] 03/29/2014, 12:47 PM  If 8PM-8AM, please contact night-coverage at [email or web address], password [email or web address]   Disclaimer: This note was dictated with voice recognition software. Similar sounding words can inadvertently be transcribed and may not be corrected upon review.

## 2014-03-29 NOTE — Progress Notes (Signed)
Physical Therapy Treatment Patient Details Name: [name] MRN: [id number] DOB: Mar 09, 1941 Today's Date: 03/29/2014    History of Present Illness 73 y.o. female brought in by her husband for AMS and persistent substernal chest pain since she fell 10 days ago resulting in sternal and rib fractures. + UTI  PMHx- hypertension, diabetes Mellitus on insulin, chronic diastolic heart failure, obesity, COPD on home oxygen 3 lit/min, major depression with anxiety, psychosis, restless leg syndrome, sleep apnea, liver cirrhosis,     PT Comments    Pt progressing slowly towards physical therapy goals. Gait training distance decreased today and was limited due to O2 sats dropping from 93% at rest to 79% during ambulation. Pt on 3L/min supplemental O2 throughout session. Will continue to follow and progress as able per POC.   Follow Up Recommendations  Home health PT;Supervision/Assistance - 24 hour     Equipment Recommendations  None recommended by PT    Recommendations for Other Services       Precautions / Restrictions Precautions Precautions: Fall Precaution Comments: h/o multiple falls recently Restrictions Weight Bearing Restrictions: No    Mobility  Bed Mobility Overal bed mobility: Needs Assistance;+2 for physical assistance Bed Mobility: Supine to Sit;Sit to Sidelying     Supine to sit: Min assist;HOB elevated   Sit to sidelying: Mod assist;+2 for physical assistance General bed mobility comments: VC's for sequencing and technique. Hand-over-hand assist to reach for bed rails, as she was trying to pull up to sitting with therapist's hand instead.  Transfers Overall transfer level: Needs assistance Equipment used: Rolling walker (2 wheeled) Transfers: Sit to/from Stand Sit to Stand: Min assist Stand pivot transfers: Min assist;+2 safety/equipment       General transfer comment: max directional v/c's, assist for safe hand placement/walker  management  Ambulation/Gait Ambulation/Gait assistance: Mod assist Ambulation Distance (Feet): 10 Feet Assistive device: Rolling walker (2 wheeled) Gait Pattern/deviations: Step-to pattern;Decreased stride length;Trunk flexed Gait velocity: Decreased Gait velocity interpretation: Below normal speed for age/gender General Gait Details: VC's for pursed-lip breathing and sequencing. Pt ambulated about 10 feet and O2 sats were reading 83%, decreasing down to 79%. Gait training was ended and pt was instructed in pursed-lip breathing while sitting in the recliner.   Stairs            Wheelchair Mobility    Modified Rankin (Stroke Patients Only)       Balance Overall balance assessment: Needs assistance;History of Falls Sitting-balance support: Feet supported;No upper extremity supported Sitting balance-Leahy Scale: Fair     Standing balance support: Bilateral upper extremity supported Standing balance-Leahy Scale: Poor                      Cognition Arousal/Alertness: Awake/alert Behavior During Therapy: Anxious;WFL for tasks assessed/performed (became anxious as treatment progressed) Overall Cognitive Status: Impaired/Different from baseline Area of Impairment: Safety/judgement;Attention;Awareness   Current Attention Level: Sustained Memory: Decreased short-term memory Following Commands: Follows one step commands with increased time Safety/Judgement: Decreased awareness of safety;Decreased awareness of deficits Awareness: Emergent Problem Solving: Slow processing;Decreased initiation;Requires verbal cues;Requires tactile cues      Exercises General Exercises - Lower Extremity Ankle Circles/Pumps: 10 reps Quad Sets: 10 reps    General Comments        Pertinent Vitals/Pain See above for O2 sats. Pt reports sternal pain from fracture.     Home Living  Prior Function            PT Goals (current goals can now be found  in the care plan section) Acute Rehab PT Goals Patient Stated Goal: none stated PT Goal Formulation: With patient Time For Goal Achievement: 04/03/14 Potential to Achieve Goals: Good Progress towards PT goals: Progressing toward goals    Frequency  Min 3X/week    PT Plan Current plan remains appropriate    Co-evaluation             End of Session Equipment Utilized During Treatment: Gait belt;Oxygen Activity Tolerance: Patient limited by fatigue;Treatment limited secondary to medical complications (Comment) (desaturation on 3L O2 to 79%) Patient left: with family/visitor present;in bed;with bed alarm set     Time: [id number] PT Time Calculation (min): 29 min  Charges:  $Gait Training: 8-22 mins $Therapeutic Activity: 8-22 mins                    G Codes:      Jolyn Lent 04-06-14, 5:54 PM  Jolyn Lent, PT, DPT Acute Rehabilitation Services Pager: [phone]

## 2014-03-30 ENCOUNTER — Telehealth: Payer: Self-pay | Admitting: Pulmonary Disease

## 2014-03-30 LAB — BASIC METABOLIC PANEL
Anion gap: 11 (ref 5–15)
BUN: 38 mg/dL — ABNORMAL HIGH (ref 6–23)
CO2: 23 mEq/L (ref 19–32)
Calcium: 8.7 mg/dL (ref 8.4–10.5)
Chloride: 105 mEq/L (ref 96–112)
Creatinine, Ser: 0.98 mg/dL (ref 0.50–1.10)
GFR calc Af Amer: 66 mL/min — ABNORMAL LOW (ref >90)
GFR, EST NON AFRICAN AMERICAN: 57 mL/min — AB (ref >90)
GLUCOSE: 306 mg/dL — AB (ref 70–99)
POTASSIUM: 5.1 meq/L (ref 3.7–5.3)
SODIUM: 139 meq/L (ref 137–147)

## 2014-03-30 LAB — GLUCOSE, CAPILLARY
GLUCOSE-CAPILLARY: 318 mg/dL — AB (ref 70–99)
Glucose-Capillary: 201 mg/dL — ABNORMAL HIGH (ref 70–99)
Glucose-Capillary: 249 mg/dL — ABNORMAL HIGH (ref 70–99)
Glucose-Capillary: 278 mg/dL — ABNORMAL HIGH (ref 70–99)

## 2014-03-30 MED ORDER — IPRATROPIUM BROMIDE 0.02 % IN SOLN
0.5000 mg | Freq: Two times a day (BID) | RESPIRATORY_TRACT | Status: DC
  Administered 2014-03-30 – 2014-03-31 (×2): 0.5 mg via RESPIRATORY_TRACT
  Filled 2014-03-30 (×2): qty 2.5

## 2014-03-30 MED ORDER — FUROSEMIDE 40 MG PO TABS
40.0000 mg | ORAL_TABLET | Freq: Every day | ORAL | Status: DC
  Administered 2014-03-30 – 2014-03-31 (×2): 40 mg via ORAL
  Filled 2014-03-30 (×2): qty 1

## 2014-03-30 MED ORDER — LEVALBUTEROL HCL 0.63 MG/3ML IN NEBU
0.6300 mg | INHALATION_SOLUTION | Freq: Two times a day (BID) | RESPIRATORY_TRACT | Status: DC
  Administered 2014-03-30 – 2014-03-31 (×2): 0.63 mg via RESPIRATORY_TRACT
  Filled 2014-03-30 (×4): qty 3

## 2014-03-30 MED ORDER — PREDNISONE 20 MG PO TABS
40.0000 mg | ORAL_TABLET | Freq: Every day | ORAL | Status: DC
  Administered 2014-03-31: 40 mg via ORAL
  Filled 2014-03-30 (×2): qty 2

## 2014-03-30 NOTE — Progress Notes (Signed)
[facility] PROGRESS NOTE  [name] BWL:[id number] DOB: March 11, 1941 DOA: 03/25/2014  PCP: Ria Bush, MD  Brief HPI: 73yo with multiple medical problems as noted under PMH presented with worsening chest pain. This is thought to be due to recent sternal fracture sustained as a result of a fall. She was also noted to have ARF, UTI and confusion.   Subjective: Breathing better. She use 3 L oxygen at home. Mental status is close to baseline per family.  Unclear if husband will be able to provide 24 hours care.   Chest wall Pain  Most likely due to sternal fracture sustained as a result of a fall. Small associated hematoma also noted on CT. Pain control.   Mildly Elevated troponin and Questionable new onset Atrial fibrillation Could have had cardiac contusion or could be due to renal failure. EKG not definitive for A fib. Cards has read as sinus. ECHO report as above. Normal systolic function without wall motion abnormalities. Appreciate cards input. She was seen by cardiology in June and is apparently not a candidate for invasive procedures because of her h/o GI bleed. No further work up.  Acute on CKD stage 3 with Hyperkalemia Possibly from dehydration and decreased po intake. Renal function back to baseline. Good UO. KVO IVF. Potassium is normal now.   E coli UTI Fever most likely due to UTI. Urine growing E coli resistant to Cipro. Sensitive to Ceftriaxone. She has tolerated Keflex in past. Will change to oral Vantin.  Blood cultures negative so far. CXR raised possibility of infiltrates as well.  Acute Hypoxic Respiratory failure and questionable CAP/COPD on Home O2 Continue O2. Continue Abx and inhaled steroids. Pneumonia was likely present at admission considering Ct findings. Repeat CXR shows infiltrate in right lung. It is possible she could have aspirated when she was encephalopathic. Continue current antibiotics. Might benefit from short course of steroids  considering her COPD. Will initiate Prednisone.  Resume lasix low dose.   Acute encephalopathy Much better. Multifactorial due to pain, UTI, hypoxia, ARF. Due to falls CT head was obtained which shows non specific changes. Since she is clinically improved no need to pursue MRI. Also noted to have elevated Ammonia. Continue Lactulose. Haldol PRN. Repeat Ammonia is normal. Aspiration precautions.  Likely esophageal Dysphagia Probably from GERD. Could try Barium swallow to rule out strictures. Her respiratory status precludes endoscopy at this time. Change to PPI twice daily. Esophagogram negative.   Hyponatremia Resolved.  Improved. Possibly from dehydyration. Stopped spironolactone.   Chronic Anemia and thrombocytopenia This is chronic due to chronic GI bleed. Hgb slightly lower today. Due to hypoxia, elevated trop she was transfused PRBC. Hgb not much better but stable. No active bleeding. Monitor platelet counts closely.  Diabetes Mellitus Type 2 Her last hgba1c from 7/28 is 5.3. Continue current doses of Lantus and SSI.   Transient Hypotension  Resolved with fluids. BP now improved. Ok to resume nadolol. Holding ACEI  for now.   Chronic diastolic heart failure Renal function stable. Resume low dose lasix, repeat B-met in am.    History of NASH/Liver cirrhosis and splenomegaly/Hepatic Encephalopathy Continue Lactulose and rifaximin. Ammonia level noted to be elevated initially. Normal now. Resume nadolol.  Code Status: Limited Code. No CPR, Intubation or shocks. DVT Prophylaxis: SCD's    Family Communication: Discussed with patient  Disposition Plan:might need SNF for rehab.   Objective: Vital Signs  Filed Vitals:   03/29/14 1909 03/29/14 2023 03/29/14 2048 03/30/14 0629  BP:  121/62  145/52  Pulse:  60  56  Temp: 98.9 F (37.2 C) 98.2 F (36.8 C)  97.6 F (36.4 C)  TempSrc: Oral Oral  Oral  Resp:  18  18  Height:      Weight:    107.9 kg (237 lb 14 oz)  SpO2: 99%  98% 98% 100%    Intake/Output Summary (Last 24 hours) at 03/30/14 1135 Last data filed at 03/30/14 1119  Gross per 24 hour  Intake    600 ml  Output   1950 ml  Net  -1350 ml   Filed Weights   03/28/14 0642 03/29/14 0330 03/30/14 0629  Weight: 104.781 kg (231 lb) 109.181 kg (240 lb 11.2 oz) 107.9 kg (237 lb 14 oz)    General appearance: appears stated age, , morbidly obese.  Resp: decreased air entry bilaterally. crackles heard at bases right more than left.  Cardio: regular rate and rhythm, S1, S2 normal, systolic murmur: early systolic 3/6, crescendo throughout the precordium, no click, no rub and no edema GI: obese, slightly distended, BS present. Non tender. no definite masses.  Skin: improved erythema under breasts and lower abdo folds. Open blisters noted over left abdominal wall healing as well. No vesicles.  Lab Results:  Basic Metabolic Panel:  Recent Labs Lab 03/26/14 0259 03/26/14 1120 03/27/14 0250 03/28/14 0331 03/29/14 0502  NA 133* 134* 143 146 139  K 6.0* 5.7* 4.6 4.8 4.6  CL 99 100 110 110 105  CO2 _0 GLUCOSE 143* 195* 141* 133* 135*  BUN 86* 81* 75* 57* 48*  CREATININE 2.96* 2.60* 2.05* 1.45* 1.35*  CALCIUM 8.5 8.7 7.8* 8.8 8.6   Liver Function Tests:  Recent Labs Lab 03/26/14 0055 03/27/14 0250 03/28/14 0331  AST 69* 48* 37  ALT _1 ALKPHOS 101 78 95  BILITOT 2.4* 1.5* 1.4*  PROT 6.3 5.2* 6.4  ALBUMIN 3.1* 2.4* 2.8*    Recent Labs Lab 03/26/14 0055 03/28/14 0331  AMMONIA 62* 27   CBC:  Recent Labs Lab 03/25/14 1927 03/26/14 0259 03/27/14 0250 03/28/14 0331 03/29/14 0502  WBC 6.5 8.2 5.0 6.6 6.9  NEUTROABS 5.7  --   --   --   --   HGB 8.1* 7.5* 8.0* 9.2* 8.7*  HCT 24.5* 23.4* 24.5* 28.8* 27.6*  MCV 103.8* 103.1* 99.6 101.1* 100.7*  PLT 80* 83* 66* 84* 81*   Cardiac Enzymes:  Recent Labs Lab 03/25/14 1927 03/26/14 0055 03/26/14 0259 03/26/14 1120  TROPONINI 0.55* 0.48* 0.48* 0.45*   BNP (last 3  results)  Recent Labs  10/03/13 0045 01/19/14 1217 03/26/14 0259  PROBNP 207.5* 85.0 3352.0*   CBG:  Recent Labs Lab 03/29/14 1130 03/29/14 1618 03/29/14 2229 03/30/14 0613 03/30/14 1119  GLUCAP 123* 158* 328* 201* 249*    Recent Results (from the past 240 hour(s))  URINE CULTURE     Status: None   Collection Time    03/25/14  9:30 PM      Result Value Ref Range Status   Specimen Description URINE, CATHETERIZED   Final   Special Requests NONE   Final   Culture  Setup Time     Final   Value: 03/26/2014 01:29     Performed at [place]     Final   Value: >=100,000 COLONIES/ML     Performed at [facility]   Culture     Final   Value: ESCHERICHIA COLI  Performed at [facility]   Report Status 03/28/2014 FINAL   Final   Organism ID, Bacteria ESCHERICHIA COLI   Final  URINE CULTURE     Status: None   Collection Time    03/26/14  1:18 AM      Result Value Ref Range Status   Specimen Description URINE, RANDOM   Final   Special Requests NONE   Final   Culture  Setup Time     Final   Value: 03/26/2014 12:35     Performed at [place]     Final   Value: >=100,000 COLONIES/ML     Performed at [facility]   Culture     Final   Value: ESCHERICHIA COLI     Performed at [facility]   Report Status 03/28/2014 FINAL   Final   Organism ID, Bacteria ESCHERICHIA COLI   Final  MRSA PCR SCREENING     Status: None   Collection Time    03/26/14  2:04 AM      Result Value Ref Range Status   MRSA by PCR NEGATIVE  NEGATIVE Final   Comment:            The GeneXpert MRSA Assay (FDA     approved for NASAL specimens     only), is one component of a     comprehensive MRSA colonization     surveillance program. It is not     intended to diagnose MRSA     infection nor to guide or     monitor treatment for     MRSA infections.  CULTURE, BLOOD (ROUTINE X 2)     Status: None   Collection Time     03/26/14  5:40 AM      Result Value Ref Range Status   Specimen Description BLOOD LEFT FOREARM   Final   Special Requests BOTTLES DRAWN AEROBIC AND ANAEROBIC 10CC EACH   Final   Culture  Setup Time     Final   Value: 03/26/2014 11:55     Performed at [facility]   Culture     Final   Value:        BLOOD CULTURE RECEIVED NO GROWTH TO DATE CULTURE WILL BE HELD FOR 5 DAYS BEFORE ISSUING A FINAL NEGATIVE REPORT     Performed at [facility]   Report Status PENDING   Incomplete  CULTURE, BLOOD (ROUTINE X 2)     Status: None   Collection Time    03/26/14  5:50 AM      Result Value Ref Range Status   Specimen Description BLOOD LEFT HAND   Final   Special Requests BOTTLES DRAWN AEROBIC AND ANAEROBIC 10CC EACH   Final   Culture  Setup Time     Final   Value: 03/26/2014 11:56     Performed at [facility]   Culture     Final   Value:        BLOOD CULTURE RECEIVED NO GROWTH TO DATE CULTURE WILL BE HELD FOR 5 DAYS BEFORE ISSUING A FINAL NEGATIVE REPORT     Performed at [facility]   Report Status PENDING   Incomplete      Studies/Results: Dg Esophagus  03/29/2014   CLINICAL DATA:  Dysphagia. Recent pneumonia possibly due to aspiration.  EXAM: ESOPHOGRAM/BARIUM SWALLOW  TECHNIQUE: Single contrast examination was performed using  thin barium.  FLUOROSCOPY TIME:  0  min 27 seconds  COMPARISON:  None.  FINDINGS: Exam was somewhat limited due to patient's to diluted physical condition. The patient was able to drink thin barium from a straw in the semi-erect LPO position. The shows no evidence of esophageal obstruction. There is no evidence of esophageal mass or stricture. No hiatal hernia is demonstrated. No aspiration was seen during this exam.  IMPRESSION: Negative.  No evidence of esophageal stricture or obstruction.   Electronically Signed   By: Earle Gell M.D.   On: 03/29/2014 15:05   Dg Chest Port 1 View  03/28/2014   CLINICAL DATA:  Cough and wheezing  EXAM:  PORTABLE CHEST - 1 VIEW  COMPARISON:  Chest radiograph March 22, 2014 and chest CT March 25, 2014  FINDINGS: There is airspace consolidation in the right perihilar and lower lobe regions, increased. There is mild interstitial edema in the mid and lower lung zones as well. Heart is enlarged. The pulmonary vascularity reflects underlying pulmonary arterial hypertension. No pneumothorax. No adenopathy is appreciable, although the prominence of the main pulmonary arteries could mask mild right hilar adenopathy.  IMPRESSION: Infiltrate on the right involving portions of the right perihilar and lower lobe regions. Underlying degree of congestive heart failure. No pneumothorax. Evidence of pulmonary arterial hypertension.   Electronically Signed   By: Lowella Grip M.D.   On: 03/28/2014 15:45    Medications:  Scheduled: . buPROPion  150 mg Oral Daily  . cefpodoxime  200 mg Oral Q12H  . furosemide  40 mg Oral Daily  . insulin aspart  0-20 Units Subcutaneous TID WC  . insulin aspart  0-5 Units Subcutaneous QHS  . insulin glargine  80 Units Subcutaneous QHS  . ipratropium  0.5 mg Nebulization BID  . lactulose  20 g Oral BID  . levalbuterol  0.63 mg Nebulization BID  . mometasone-formoterol  2 puff Inhalation BID  . nadolol  10 mg Oral Daily  . nystatin   Topical TID  . pantoprazole  40 mg Oral BID  . [START ON 03/31/2014] predniSONE  40 mg Oral Q breakfast  . QUEtiapine  400 mg Oral QHS  . rifaximin  550 mg Oral Daily  . rOPINIRole  4 mg Oral QHS  . simvastatin  40 mg Oral Daily  . sodium chloride  3 mL Intravenous Q12H  . valACYclovir  1,000 mg Oral Daily   Continuous: . sodium chloride 10 mL/hr at 03/29/14 0107   GYJ:EHUDJSHFWYOVZ, acetaminophen, ALPRAZolam, haloperidol lactate, hydrALAZINE, levalbuterol, morphine injection, ondansetron (ZOFRAN) IV, ondansetron, oxyCODONE  Assessment/Plan:  Active Problems:   IRON DEFICIENCY ANEMIA SECONDARY TO BLOOD LOSS   CAD   COPD   GAVE (gastric  antral vascular ectasia)   Chronic diastolic CHF (congestive heart failure)   Physical deconditioning   Cirrhosis of liver without mention of alcohol   Chronic respiratory failure   Acute kidney injury   AKI (acute kidney injury)   Hyperkalemia   Elevated troponin   Hyponatremia   UTI (lower urinary tract infection)   Acute encephalopathy   CAP (community acquired pneumonia)       Consultants: Cardiology  Procedures:  2D ECHO 8/1:  Study Conclusions - Left ventricle: The cavity size was normal. Systolic function was normal. The estimated ejection fraction was in the range of 55% to 60%. Wall motion was normal; there were no regional wall motion abnormalities. - Aortic valve: Mildly thickened leaflets. There was very mild stenosis. There was mild regurgitation. Valve area (VTI): 2.89 cm^2. Valve  area (Vmax): 2.7 cm^2. - Mitral valve: Moderately to severely calcified annulus. - Left atrium: The atrium was mildly to moderately dilated.  Antibiotics: Levaquin 7/31-->8/3 Valtrex 8/1--> Vantin 8/3--> Azithromycin 8/3-->8/5   LOS: 5 days   Niel Hummer A  [facility] Pager [phone] 03/30/2014, 11:35 AM  If 8PM-8AM, please contact night-coverage at [email or web address], password [email or web address]   Disclaimer: This note was dictated with voice recognition software. Similar sounding words can inadvertently be transcribed and may not be corrected upon review.

## 2014-03-30 NOTE — Telephone Encounter (Signed)
Called and spoke with pts husband and he is aware of no samples at this time in the office of the [facility].  He stated that the pt is currently in the hospital now.

## 2014-03-30 NOTE — Clinical Social Work Psychosocial (Signed)
     Clinical Social Work Department BRIEF PSYCHOSOCIAL ASSESSMENT 03/30/2014  Patient:  [name], [name]     Account Number:  [id number]     [place] date:  03/25/2014  Clinical Social Worker:  Iona Coach  Date/Time:  03/30/2014 02:47 PM  Referred by:  Care Management  Date Referred:  03/30/2014 Referred for  SNF Placement   Other Referral:   Interview type:  Other - See comment Other interview type:   Patient and husband- telephonically    PSYCHOSOCIAL DATA Living Status:  HUSBAND Admitted from facility:   Level of care:   Primary support name:  Halayna Blane  [phone] Primary support relationship to patient:  SPOUSE Degree of support available:   Strong support    CURRENT CONCERNS Current Concerns  Other - See comment   Other Concerns:   SNF vs home with [facility] and Private care sitters    SOCIAL WORK ASSESSMENT / PLAN 73 year old female admitted from home where she lives with her husband [name].  CSW notified by [facility] to talk to patient about short term SNF due to concerns noted by the onsite [place] of patient's home situation. She feels that patient should be placed in SNF- however, Physical Therapy is only recommending home health PT.  Patient's husband works first shift and she currently has a neighbor who stays with her from 8-1.  Her husband states that he gets off work at 3.  CSW discussed concerns by [facility] CM that patient should not be staying at home alone and that SNF placement is recommended. Patient prefers to return home as does her husband. He stated that he plans to talk to his wife's caregiver to see if their private duty time could extend until he is able to return home. He wil talk to the caregiver and notify CSW in the a.m.  Will proceed with SNF placement if patient and husband decide not to return home. Fl2 has been initiated.   Assessment/plan status:  Psychosocial Support/Ongoing Assessment of Needs Other assessment/ plan:   Information/referral to  community resources:   None at this time.    PATIENTS/FAMILYS RESPONSE TO PLAN OF CARE: Patient is alert and oriented; she wants to retur home and feels that she is managing well at home. CSW spoke to her husband via telephone per her permission and he stated that he would prefer that she return home if a safe d/c plan can be put into effect.  CSW will follow up with patient and husband in the morning.

## 2014-03-30 NOTE — Progress Notes (Signed)
Results for MARYAM, FEELY (MRN [id number]) as of 03/30/2014 11:56  Ref. Range 03/29/2014 16:18 03/29/2014 22:29 03/30/2014 06:13 03/30/2014 11:19  Glucose-Capillary Latest Range: 70-99 mg/dL 158 (H) 328 (H) 201 (H) 249 (H)   CBGs continue to be greater than 180 mg/dl.  Recommend increasing Lantus to 85 units every HS if CBGs continue to be elevated.  Will continue to follow.  [name] RN BSN CDE

## 2014-03-30 NOTE — Progress Notes (Signed)
Physical Therapy Treatment Patient Details Name: [name] MRN: [id number] DOB: 07-01-1941 Today's Date: 03/30/2014    History of Present Illness 73 y.o. female brought in by her husband for AMS and persistent substernal chest pain since she fell 10 days ago resulting in sternal and rib fractures. + UTI  PMHx- hypertension, diabetes Mellitus on insulin, chronic diastolic heart failure, obesity, COPD on home oxygen 3 lit/min, major depression with anxiety, psychosis, restless leg syndrome, sleep apnea, liver cirrhosis,     PT Comments    Pt progressing slowly towards physical therapy goals. Although functionally improved today, do not feel that pt is at a level to safely return home. It would be very demanding on her family as pt is at an increased risk of falls, and tolerance for functional activity is low. Discussed HHPT vs. SNF with pt and family, and feel the more appropriate discharge disposition is STR at the SNF level. Family agreeable to this, and pt eventually agreeable to this as well. PT frequency appropriately updated to reflect this change. Will continue to follow and progress as able per POC.   Follow Up Recommendations  SNF;Supervision/Assistance - 24 hour     Equipment Recommendations  None recommended by PT    Recommendations for Other Services       Precautions / Restrictions Precautions Precautions: Fall Precaution Comments: h/o multiple falls recently Restrictions Weight Bearing Restrictions: No    Mobility  Bed Mobility Overal bed mobility: Needs Assistance;+2 for physical assistance Bed Mobility: Supine to Sit     Supine to sit: Mod assist     General bed mobility comments: VC's for sequencing and technique. Hand-over-hand assist to reach for bed rails, as she was trying to pull up to sitting with therapist's hand instead.  Transfers Overall transfer level: Needs assistance Equipment used: Rolling walker (2 wheeled) Transfers: Sit to/from  Stand Sit to Stand: Min guard         General transfer comment: VC's for hand placement on seated surface for safety. No assist to power-up to full standing, however increased time was required.   Ambulation/Gait Ambulation/Gait assistance: Min assist Ambulation Distance (Feet): 25 Feet Assistive device: Rolling walker (2 wheeled) Gait Pattern/deviations: Step-through pattern;Decreased stride length;Trunk flexed;Trendelenburg Gait velocity: Decreased Gait velocity interpretation: Below normal speed for age/gender General Gait Details: VC's for pursed-lip breathing and sequencing with the RW. Pt quickly SOB but willing to keep moving. O2 sats decreased from 95% to 88% throughout course of gait training, decreasing further to 86% once pt took a seated rest break. Sats able to be improved back to 90's after ~2 minutes of sitting.    Stairs            Wheelchair Mobility    Modified Rankin (Stroke Patients Only)       Balance Overall balance assessment: Needs assistance Sitting-balance support: Feet supported;Bilateral upper extremity supported Sitting balance-Leahy Scale: Fair     Standing balance support: Bilateral upper extremity supported;During functional activity Standing balance-Leahy Scale: Poor                      Cognition Arousal/Alertness: Awake/alert Behavior During Therapy: Anxious;WFL for tasks assessed/performed Overall Cognitive Status: Impaired/Different from baseline Area of Impairment: Safety/judgement;Attention;Awareness   Current Attention Level: Sustained Memory: Decreased short-term memory Following Commands: Follows one step commands with increased time Safety/Judgement: Decreased awareness of safety;Decreased awareness of deficits Awareness: Emergent Problem Solving: Slow processing;Decreased initiation;Requires verbal cues;Requires tactile cues      Exercises  General Comments General comments (skin integrity, edema,  etc.): Long discussion regarding SNF vs. HHPT at d/c. Family quiet for most of the discussion, however at the end were really encouraging her to accept rehab. Pt agreeable but was clearly frustrated with the situation.       Pertinent Vitals/Pain See gait training details for O2 sats.     Home Living                      Prior Function            PT Goals (current goals can now be found in the care plan section) Acute Rehab PT Goals Patient Stated Goal: none stated PT Goal Formulation: With patient Time For Goal Achievement: 04/03/14 Potential to Achieve Goals: Good Progress towards PT goals: Progressing toward goals    Frequency  Min 2X/week    PT Plan Discharge plan needs to be updated;Frequency needs to be updated    Co-evaluation             End of Session Equipment Utilized During Treatment: Gait belt;Oxygen Activity Tolerance: Patient limited by fatigue Patient left: in chair;with call bell/phone within reach;with chair alarm set;with family/visitor present     Time: 9371-6967 PT Time Calculation (min): 31 min  Charges:  $Gait Training: 8-22 mins $Therapeutic Activity: 8-22 mins                    G Codes:      Jolyn Lent 03-Apr-2014, 4:26 PM  Jolyn Lent, PT, DPT Acute Rehabilitation Services Pager: [phone]

## 2014-03-30 NOTE — Progress Notes (Signed)
Bladder scan complete 368ml pt sat on BSC on urge to void.

## 2014-03-31 LAB — BASIC METABOLIC PANEL
ANION GAP: 10 (ref 5–15)
BUN: 35 mg/dL — ABNORMAL HIGH (ref 6–23)
CALCIUM: 9 mg/dL (ref 8.4–10.5)
CO2: 25 mEq/L (ref 19–32)
Chloride: 107 mEq/L (ref 96–112)
Creatinine, Ser: 0.9 mg/dL (ref 0.50–1.10)
GFR, EST AFRICAN AMERICAN: 73 mL/min — AB (ref >90)
GFR, EST NON AFRICAN AMERICAN: 63 mL/min — AB (ref >90)
Glucose, Bld: 113 mg/dL — ABNORMAL HIGH (ref 70–99)
Potassium: 4.3 mEq/L (ref 3.7–5.3)
SODIUM: 142 meq/L (ref 137–147)

## 2014-03-31 LAB — GLUCOSE, CAPILLARY
Glucose-Capillary: 117 mg/dL — ABNORMAL HIGH (ref 70–99)
Glucose-Capillary: 267 mg/dL — ABNORMAL HIGH (ref 70–99)

## 2014-03-31 MED ORDER — VALACYCLOVIR HCL 1 G PO TABS: 1000.0000 mg | ORAL_TABLET | Freq: Every day | ORAL | Status: DC

## 2014-03-31 MED ORDER — OXYCODONE HCL 5 MG PO TABS: 5.0000 mg | ORAL_TABLET | ORAL | Status: DC | PRN

## 2014-03-31 MED ORDER — INSULIN GLARGINE 100 UNIT/ML ~~LOC~~ SOLN: 80.0000 [IU] | Freq: Every day | SUBCUTANEOUS | Status: DC

## 2014-03-31 MED ORDER — PREDNISONE 20 MG PO TABS: ORAL_TABLET | ORAL | Status: DC

## 2014-03-31 MED ORDER — FUROSEMIDE 40 MG PO TABS: 40.0000 mg | ORAL_TABLET | Freq: Two times a day (BID) | ORAL | Status: DC

## 2014-03-31 MED ORDER — CEFPODOXIME PROXETIL 200 MG PO TABS: 200.0000 mg | ORAL_TABLET | Freq: Two times a day (BID) | ORAL | Status: DC

## 2014-03-31 NOTE — Discharge Summary (Signed)
Physician Discharge Summary  TATIONA STECH QIH:[id number] DOB: [birth year] DOA: 03/25/2014  PCP: Ria Bush, MD  Admit date: 03/25/2014 Discharge date: 03/31/2014  Time spent: 35 minutes  Recommendations for Outpatient Follow-up:  1. Need B-met to follow renal function now that lasix was resume.  2. Depending on renal function please resume spironolactone.    Discharge Diagnoses:    sternal fracture    UTI (lower urinary tract infection)   Acute encephalopathy   CAP (community acquired pneumonia)   IRON DEFICIENCY ANEMIA SECONDARY TO BLOOD LOSS   CAD   COPD   GAVE (gastric antral vascular ectasia)   Chronic diastolic CHF (congestive heart failure)   Physical deconditioning   Cirrhosis of liver without mention of alcohol   Chronic respiratory failure   Acute kidney injury   AKI (acute kidney injury)   Hyperkalemia   Elevated troponin   Hyponatremia     Discharge Condition: stable.   Diet recommendation: Heart Healthy  Filed Weights   03/29/14 0330 03/30/14 0629 03/31/14 0442  Weight: 109.181 kg (240 lb 11.2 oz) 107.9 kg (237 lb 14 oz) 109.6 kg (241 lb 10 oz)    History of present illness:  HPI: [name] is a 73 y.o. female with prior h/o hypertension, diabetes Mellitus on insulin, chronic diastolic heart failure, obesity, COPD on home oxygen 3 lit/min, major depression with anxiety, psychosis, restless leg syndrome, iron deficiency anemia secondary to chronic blood loss from AVM's, sleep apnea, liver cirrhosis, brought in by her husband for persistent substernal chest pain since 10 days. Patient is confused and delirious and hollering occasionally,. Most of the history available is from the patient 's husband at bedside, who is not a good historian. As per the husband she fell 10 days ago , had a sternal fracture and a few rib fractures. She was seen in ED few days ago and sent home on oxycodone. She was brought back for persistent pain.  On arrival to ED, she was  hypotensive, bradycardic, hypoxic on 4 liters of oxygen and confused. Labs revealed hyperkalemia of 6.1 to 5.8, hyponatremia, acute on chronic renal failure, elevated troponin, low platelets of 80,000, and a UTI. EKG revealed new onset atrial fibrillation. She underwent CT chest without contrast revealing non depressed sternal fracture with small associated hematoma, and a non displaced acute fracture of the right lateral Ninth rib. She was also found to have patchy atelectasis or infiltration in the right middle lobe and left lingula. No pneumothorax.  She was referred to medical service for admission . She will be admitted to step down for further evaluation and management.    Hospital Course:  Brief HPI: 73yo with multiple medical problems as noted under PMH presented with worsening chest pain. This is thought to be due to recent sternal fracture sustained as a result of a fall. She was also noted to have ARF, UTI and confusion.   Subjective:  Breathing better. She use 3 L oxygen at home. Mental status is close to baseline per family.   Chest wall Pain  Most likely due to sternal fracture sustained as a result of a fall. Small associated hematoma also noted on CT. Pain control.   Mildly Elevated troponin and Questionable new onset Atrial fibrillation  Could have had cardiac contusion or could be due to renal failure. EKG not definitive for A fib. Cards has read as sinus. ECHO report as above. Normal systolic function without wall motion abnormalities. Appreciate cards input. She was seen by  cardiology in June and is apparently not a candidate for invasive procedures because of her h/o GI bleed. No further work up.   Acute on CKD stage 3 with Hyperkalemia  Possibly from dehydration and decreased po intake. Renal function back to baseline. Good UO. KVO IVF. Potassium is normal now.  Resolved. Monitor on lasix.   E coli UTI  Fever most likely due to UTI. Urine growing E coli resistant to Cipro.  Sensitive to Ceftriaxone. She has tolerated Keflex in past. Will change to oral Vantin. Blood cultures negative so far. CXR raised possibility of infiltrates as well.  Day 6 antibiotic. Will provide 3 more days.   Acute Hypoxic Respiratory failure and questionable CAP/COPD on Home O2  Continue O2. Continue Abx and inhaled steroids. Pneumonia was likely present at admission considering Ct findings. Repeat CXR shows infiltrate in right lung. It is possible she could have aspirated when she was encephalopathic. Continue current antibiotics. Might benefit from short course of steroids considering her COPD. Will initiate Prednisone.  Improved. On 3 L chronically at home.  Continue with lasix.   Acute encephalopathy  Much better. Multifactorial due to pain, UTI, hypoxia, ARF. Due to falls CT head was obtained which shows non specific changes. Since she is clinically improved no need to pursue MRI. Also noted to have elevated Ammonia. Continue Lactulose. Haldol PRN. Repeat Ammonia is normal. Aspiration precautions.   Likely esophageal Dysphagia  Probably from GERD. Could try Barium swallow to rule out strictures. Her respiratory status precludes endoscopy at this time. Change to PPI twice daily.  Esophagogram negative.   Hyponatremia  Resolved.  Improved. Possibly from dehydyration. Stopped spironolactone.   Chronic Anemia and thrombocytopenia  This is chronic due to chronic GI bleed. Hgb slightly lower today. Due to hypoxia, elevated trop she was transfused PRBC. Hgb not much better but stable. No active bleeding. Monitor platelet counts closely.   Diabetes Mellitus Type 2  Her last hgba1c from 7/28 is 5.3. Continue current doses of Lantus and SSI.   Transient Hypotension  Resolved with fluids. BP now improved. Ok to resume nadolol. Holding ACEI for now.   Chronic diastolic heart failure  Renal function stable. Resume low dose lasix 8-5. Renal function stable.   History of NASH/Liver  cirrhosis and splenomegaly/Hepatic Encephalopathy  Continue Lactulose and rifaximin. Ammonia level noted to be elevated initially. Normal now. Resume nadolol.   Procedures:  none  Consultations:  none  Discharge Exam: Filed Vitals:   03/31/14 1024  BP: 127/51  Pulse: 65  Temp: 98.1 F (36.7 C)  Resp: 18    General: Alert in no distress.  Cardiovascular: S 1, S 2 RRR Respiratory: no wheezing.   Discharge Instructions You were cared for by a hospitalist during your hospital stay. If you have any questions about your discharge medications or the care you received while you were in the hospital after you are discharged, you can call the unit and asked to speak with the hospitalist on call if the hospitalist that took care of you is not available. Once you are discharged, your primary care physician will handle any further medical issues. Please note that NO REFILLS for any discharge medications will be authorized once you are discharged, as it is imperative that you return to your primary care physician (or establish a relationship with a primary care physician if you do not have one) for your aftercare needs so that they can reassess your need for medications and monitor your lab values.  Discharge Instructions   Diet - low sodium heart healthy    Complete by:  As directed      Increase activity slowly    Complete by:  As directed             Medication List    STOP taking these medications       LANTUS SOLOSTAR Haakon  Replaced by:  insulin glargine 100 UNIT/ML injection     lisinopril 5 MG tablet  Commonly known as:  PRINIVIL,ZESTRIL     spironolactone 12.5 mg Tabs tablet  Commonly known as:  ALDACTONE      TAKE these medications       ALPRAZolam 1 MG tablet  Commonly known as:  XANAX  Take 1 mg by mouth 3 (three) times daily as needed for anxiety.     b complex vitamins tablet  Take 1 tablet by mouth daily.     buPROPion 150 MG 24 hr tablet  Commonly known  as:  WELLBUTRIN XL  Take 150 mg by mouth daily.     cefpodoxime 200 MG tablet  Commonly known as:  VANTIN  Take 1 tablet (200 mg total) by mouth every 12 (twelve) hours.     CENTRUM SILVER PO  Take 1 tablet by mouth daily.     desvenlafaxine 50 MG 24 hr tablet  Commonly known as:  PRISTIQ  Take 50 mg by mouth daily. Takes with a 140m tablet, total daily dose is 1521m    desvenlafaxine 100 MG 24 hr tablet  Commonly known as:  PRISTIQ  Take 100 mg by mouth daily. Takes with a 5020mablet, total daily dose is 150m29m  Fluticasone-Salmeterol 250-50 MCG/DOSE Aepb  Commonly known as:  ADVAIR  Inhale 1 puff into the lungs 2 (two) times daily.     furosemide 40 MG tablet  Commonly known as:  LASIX  Take 1 tablet (40 mg total) by mouth 2 (two) times daily. Take 1 1/2 tablets (60 mg) in the morning and 1 tablet in the afternoon     guaiFENesin 600 MG 12 hr tablet  Commonly known as:  MUCINEX  Take 600 mg by mouth 2 (two) times daily. 2 tablet by mouth once daily     insulin glargine 100 UNIT/ML injection  Commonly known as:  LANTUS  Inject 0.8 mLs (80 Units total) into the skin at bedtime.     insulin lispro 100 UNIT/ML KiwkPen  Commonly known as:  HUMALOG  Inject 0-25 Units into the skin daily with supper.     lactulose (encephalopathy) 10 GM/15ML Soln  Commonly known as:  CHRONULAC  Take 20 g by mouth daily. constipation     levalbuterol 0.63 MG/3ML nebulizer solution  Commonly known as:  XOPENEX  Take 0.63 mg by nebulization every 4 (four) hours as needed for wheezing or shortness of breath.     nadolol 20 MG tablet  Commonly known as:  CORGARD  Take 10 mg by mouth daily.     OCEAN NASAL SPRAY NA  Place 1 spray into the nose 3 (three) times daily as needed (congestion).     oxyCODONE 5 MG immediate release tablet  Commonly known as:  Oxy IR/ROXICODONE  Take 1 tablet (5 mg total) by mouth every 4 (four) hours as needed for moderate pain. prn     pantoprazole 40 MG  tablet  Commonly known as:  PROTONIX  Take 40 mg by mouth daily.     predniSONE  20 MG tablet  Commonly known as:  DELTASONE  Take 2 tablets for 3 days then 1 tablet for 2 days then stopped.     PROAIR HFA 108 (90 BASE) MCG/ACT inhaler  Generic drug:  albuterol  Inhale 2 puffs into the lungs every 6 (six) hours as needed for wheezing or shortness of breath.     promethazine 25 MG tablet  Commonly known as:  PHENERGAN  Take 1 tablet (25 mg total) by mouth 2 (two) times daily as needed for nausea.     QUEtiapine 400 MG tablet  Commonly known as:  SEROQUEL  Take 400 mg by mouth at bedtime.     rifaximin 550 MG Tabs tablet  Commonly known as:  XIFAXAN  Take 1 tablet (550 mg total) by mouth daily.     rOPINIRole 4 MG tablet  Commonly known as:  REQUIP  Take 4 mg by mouth at bedtime.     simvastatin 40 MG tablet  Commonly known as:  ZOCOR  Take 40 mg by mouth daily.     Vitamin D (Ergocalciferol) 50000 UNITS Caps capsule  Commonly known as:  DRISDOL  Take 50,000 Units by mouth every 7 (seven) days. On Fridays       Allergies  Allergen Reactions  . Aspirin Other (See Comments)    Causes nosebleeds  . Penicillins Itching  . Theophylline Nausea And Vomiting       Follow-up Information   Follow up with [facility]. (PT/SW)    Contact information:   [address] [place] [address] [phone]        The results of significant diagnostics from this hospitalization (including imaging, microbiology, ancillary and laboratory) are listed below for reference.    Significant Diagnostic Studies: Dg Sternum  03/22/2014   CLINICAL DATA:  Chest pain  EXAM: STERNUM - 2+ VIEW  COMPARISON:  12/27/2013  FINDINGS: Mild cortical irregularity is noted in the mid sternum which is new from the prior exam. This is consistent with a mildly displaced cortical fracture. No other focal abnormality is seen.  IMPRESSION: Mid sternal fracture   Electronically  Signed   By: Inez Catalina M.D.   On: 03/22/2014 13:15   Ct Head Wo Contrast  03/26/2014   CLINICAL DATA:  Altered mental status and falls.  EXAM: CT HEAD WITHOUT CONTRAST  TECHNIQUE: Contiguous axial images were obtained from the base of the skull through the vertex without intravenous contrast.  COMPARISON:  04/13/2008  FINDINGS: Images are mildly to moderately degraded by motion artifact. There is no definite evidence of acute large territory infarct, intracranial hemorrhage, mass, midline shift, or extra-axial fluid collection. Small hypodensities are questioned in the thalami, not clearly present on the prior study. There is also have the suggestion of hypoattenuation involving the anterior left temporal lobe. Left choroidal fissure cyst is unchanged. Ventricles and sulci are within normal limits for age.  Orbits are unremarkable. Mastoid air cells are clear. Moderate left sphenoid sinus mucosal thickening is noted. No skull fracture is identified.  IMPRESSION: Motion artifact. No acute intracranial hemorrhage or definite acute large territory infarct. Apparent hypoattenuation in the anterior left temporal lobe is favored to be artifactual, however if there is concern for acute infarct consider further evaluation with brain MRI. Small foci of decreased density in the thalami may be artifactual or reflect lacunar infarcts of indeterminate age.   Electronically Signed   By: Logan Bores   On: 03/26/2014 09:06   Ct  Chest Wo Contrast  03/25/2014   CLINICAL DATA:  Patient fell on 07/28 and was diagnosed with a sternal fracture. Patient is having increased pain since then.  EXAM: CT CHEST WITHOUT CONTRAST  TECHNIQUE: Multidetector CT imaging of the chest was performed following the standard protocol without IV contrast.  COMPARISON:  Sternum 03/22/2014  FINDINGS: The study is technically limited due to motion artifact and streak artifact arising from the patient's hands crossed across the chest. Focal cortical  irregularity is suggested in the mid sternum corresponding to nondisplaced fracture on prior plain films. Visualization of this area is limited due to artifact. There is minimal retrosternal soft tissue hematoma without significant fluid collection in the anterior mediastinum. A few prominent lymph nodes are demonstrated in the anterior mediastinum, likely reactive.  Cardiac enlargement. Coronary artery calcifications. Normal caliber thoracic aorta with calcification. Pulmonary vascularity is somewhat prominent and congestive changes are not excluded. Esophagus is decompressed. But no pleural effusions. Evaluation of lungs is limited due to respiratory motion artifact. There appears to be atelectasis in the lung bases particularly on the right. Patchy atelectasis or infiltration in the right middle lobe and left lingula. No focal consolidation or collapse. No pneumothorax. Visualized portions of the upper abdominal organs demonstrate incomplete visualization of the liver and spleen but there appears to be a cirrhotic configuration to the liver and the spleen appears enlarged. Normal alignment of the thoracic vertebrae with degenerative changes present. Acute appearing fracture of the right lateral ninth rib. Additional old right rib fractures with callus formation are also demonstrated. No displaced fractures are identified.  IMPRESSION: Non depressed sternal fracture with small associated hematoma. Aorta is normal in caliber and no abnormal mediastinal fluid collections are suggested. Nondisplaced acute fracture of the right lateral ninth rib. Additional old right rib fractures. Patchy atelectasis or infiltration in both lungs. Technically limited study due to motion artifact.   Electronically Signed   By: Lucienne Capers M.D.   On: 03/25/2014 21:18   Dg Esophagus  03/29/2014   CLINICAL DATA:  Dysphagia. Recent pneumonia possibly due to aspiration.  EXAM: ESOPHOGRAM/BARIUM SWALLOW  TECHNIQUE: Single contrast  examination was performed using  thin barium.  FLUOROSCOPY TIME:  0 min 27 seconds  COMPARISON:  None.  FINDINGS: Exam was somewhat limited due to patient's to diluted physical condition. The patient was able to drink thin barium from a straw in the semi-erect LPO position. The shows no evidence of esophageal obstruction. There is no evidence of esophageal mass or stricture. No hiatal hernia is demonstrated. No aspiration was seen during this exam.  IMPRESSION: Negative.  No evidence of esophageal stricture or obstruction.   Electronically Signed   By: Earle Gell M.D.   On: 03/29/2014 15:05   US Abdomen Limited  03/26/2014   CLINICAL DATA:  Abdominal distention, evaluate for ascites  EXAM: LIMITED ABDOMEN ULTRASOUND FOR ASCITES  TECHNIQUE: Limited ultrasound survey for ascites was performed in all four abdominal quadrants.  COMPARISON:  Prior abdominal ultrasound 02/02/2013; prior CT abdomen/pelvis 06/02/2007  FINDINGS: Negative for ascites.  The spleen is markedly enlarged measuring 17.8 x 16.5 x 7.8 cm yielding a calculated volume of 1200 cubic cm. Partial imaging of the liver demonstrates increased echogenicity and heterogeneity of the parenchyma. The liver contour appears slightly nodular.  Decompressed bladder with Foley catheter in place.  IMPRESSION: 1. Negative for ascites. 2. Incomplete imaging of the liver demonstrates findings concerning for underlying cirrhosis. 3. Marked splenomegaly perhaps secondary to portal hypertension.   Electronically  Signed   By: Jacqulynn Cadet M.D.   On: 03/26/2014 07:42   Dg Chest Port 1 View  03/28/2014   CLINICAL DATA:  Cough and wheezing  EXAM: PORTABLE CHEST - 1 VIEW  COMPARISON:  Chest radiograph March 22, 2014 and chest CT March 25, 2014  FINDINGS: There is airspace consolidation in the right perihilar and lower lobe regions, increased. There is mild interstitial edema in the mid and lower lung zones as well. Heart is enlarged. The pulmonary vascularity reflects  underlying pulmonary arterial hypertension. No pneumothorax. No adenopathy is appreciable, although the prominence of the main pulmonary arteries could mask mild right hilar adenopathy.  IMPRESSION: Infiltrate on the right involving portions of the right perihilar and lower lobe regions. Underlying degree of congestive heart failure. No pneumothorax. Evidence of pulmonary arterial hypertension.   Electronically Signed   By: Lowella Grip M.D.   On: 03/28/2014 15:45    Microbiology: Recent Results (from the past 240 hour(s))  URINE CULTURE     Status: None   Collection Time    03/25/14  9:30 PM      Result Value Ref Range Status   Specimen Description URINE, CATHETERIZED   Final   Special Requests NONE   Final   Culture  Setup Time     Final   Value: 03/26/2014 01:29     Performed at [facility]     Final   Value: >=100,000 COLONIES/ML     Performed at [facility]   Culture     Final   Value: ESCHERICHIA COLI     Performed at [facility]   Report Status 03/28/2014 FINAL   Final   Organism ID, Bacteria ESCHERICHIA COLI   Final  URINE CULTURE     Status: None   Collection Time    03/26/14  1:18 AM      Result Value Ref Range Status   Specimen Description URINE, RANDOM   Final   Special Requests NONE   Final   Culture  Setup Time     Final   Value: 03/26/2014 12:35     Performed at [place]     Final   Value: >=100,000 COLONIES/ML     Performed at [facility]   Culture     Final   Value: ESCHERICHIA COLI     Performed at [facility]   Report Status 03/28/2014 FINAL   Final   Organism ID, Bacteria ESCHERICHIA COLI   Final  MRSA PCR SCREENING     Status: None   Collection Time    03/26/14  2:04 AM      Result Value Ref Range Status   MRSA by PCR NEGATIVE  NEGATIVE Final   Comment:            The GeneXpert MRSA Assay (FDA     approved for NASAL specimens     only), is one component of a      comprehensive MRSA colonization     surveillance program. It is not     intended to diagnose MRSA     infection nor to guide or     monitor treatment for     MRSA infections.  CULTURE, BLOOD (ROUTINE X 2)     Status: None   Collection Time    03/26/14  5:40 AM      Result Value Ref Range Status   Specimen  Description BLOOD LEFT FOREARM   Final   Special Requests BOTTLES DRAWN AEROBIC AND ANAEROBIC 10CC EACH   Final   Culture  Setup Time     Final   Value: 03/26/2014 11:55     Performed at [facility]   Culture     Final   Value:        BLOOD CULTURE RECEIVED NO GROWTH TO DATE CULTURE WILL BE HELD FOR 5 DAYS BEFORE ISSUING A FINAL NEGATIVE REPORT     Performed at [facility]   Report Status PENDING   Incomplete  CULTURE, BLOOD (ROUTINE X 2)     Status: None   Collection Time    03/26/14  5:50 AM      Result Value Ref Range Status   Specimen Description BLOOD LEFT HAND   Final   Special Requests BOTTLES DRAWN AEROBIC AND ANAEROBIC 10CC EACH   Final   Culture  Setup Time     Final   Value: 03/26/2014 11:56     Performed at [facility]   Culture     Final   Value:        BLOOD CULTURE RECEIVED NO GROWTH TO DATE CULTURE WILL BE HELD FOR 5 DAYS BEFORE ISSUING A FINAL NEGATIVE REPORT     Performed at [facility]   Report Status PENDING   Incomplete     Labs: Basic Metabolic Panel:  Recent Labs Lab 03/27/14 0250 03/28/14 0331 03/29/14 0502 03/30/14 1235 03/31/14 0458  NA 143 146 139 139 142  K 4.6 4.8 4.6 5.1 4.3  CL 110 110 105 105 107  CO2 _0 GLUCOSE 141* 133* 135* 306* 113*  BUN 75* 57* 48* 38* 35*  CREATININE 2.05* 1.45* 1.35* 0.98 0.90  CALCIUM 7.8* 8.8 8.6 8.7 9.0   Liver Function Tests:  Recent Labs Lab 03/26/14 0055 03/27/14 0250 03/28/14 0331  AST 69* 48* 37  ALT _1 ALKPHOS 101 78 95  BILITOT 2.4* 1.5* 1.4*  PROT 6.3 5.2* 6.4  ALBUMIN 3.1* 2.4* 2.8*   No results found for this basename:  LIPASE, AMYLASE,  in the last 168 hours  Recent Labs Lab 03/26/14 0055 03/28/14 0331  AMMONIA 62* 27   CBC:  Recent Labs Lab 03/25/14 1927 03/26/14 0259 03/27/14 0250 03/28/14 0331 03/29/14 0502  WBC 6.5 8.2 5.0 6.6 6.9  NEUTROABS 5.7  --   --   --   --   HGB 8.1* 7.5* 8.0* 9.2* 8.7*  HCT 24.5* 23.4* 24.5* 28.8* 27.6*  MCV 103.8* 103.1* 99.6 101.1* 100.7*  PLT 80* 83* 66* 84* 81*   Cardiac Enzymes:  Recent Labs Lab 03/25/14 1927 03/26/14 0055 03/26/14 0259 03/26/14 1120  TROPONINI 0.55* 0.48* 0.48* 0.45*   BNP: BNP (last 3 results)  Recent Labs  10/03/13 0045 01/19/14 1217 03/26/14 0259  PROBNP 207.5* 85.0 3352.0*   CBG:  Recent Labs Lab 03/30/14 0613 03/30/14 1119 03/30/14 1629 03/30/14 2118 03/31/14 0619  GLUCAP 201* 249* 318* 278* 117*       Signed:  ,  A  [facility] 03/31/2014, 11:15 AM

## 2014-03-31 NOTE — Clinical Social Work Placement (Addendum)
     Clinical Social Work Department CLINICAL SOCIAL WORK PLACEMENT NOTE 03/31/2014  Patient:  [name], [name]  Account Number:  [id number] Admit date:  03/25/2014  Clinical Social Worker:  Butch Penny Aristotelis Vilardi, LCSWA  Date/time:  03/31/2014 12:00 M  Clinical Social Work is seeking post-discharge placement for this patient at the following level of care:   SKILLED NURSING   (*CSW will update this form in Epic as items are completed)   03/31/2014  Patient/family provided with [place] Department of Clinical Social Works list of facilities offering this level of care within the geographic area requested by the patient (or if unable, by the patients family).  03/31/2014  Patient/family informed of their freedom to choose among providers that offer the needed level of care, that participate in Medicare, Medicaid or managed care program needed by the patient, have an available bed and are willing to accept the patient.  03/30/2014  Patient/family informed of [facility] ownership interest in [facility], as well as of the fact that they are under no obligation to receive care at this facility.  PASARR submitted to EDS on 03/30/2014 PASARR number received on 03/31/2014  FL2 transmitted to all facilities in geographic area requested by pt/family on  03/30/2014 FL2 transmitted to all facilities within larger geographic area on   Patient informed that his/her managed care company has contracts with or will negotiate with  certain facilities, including the following:   [facility] Medicare Complete     Patient/family informed of bed offers received:  03/31/2014 Patient chooses bed at [facility], [place] Physician recommends and patient chooses bed at    Patient to be transferred to [facility] on  03/31/2014 Patient to be transferred to facility by Ambulance  [facility]) Patient and family notified of transfer on  Name of family member notified:  Hendricks Limes-   Husband  The following physician request were entered in Epic: Physician Request  Please sign FL2.  Please prepare priority discharge summary and prescriptions.    Additional Comments: 03/31/14  OK per MD for d/c today to SNF for short term care. Husband went to facility to sign admit papers. Patient is agreeable to d/c plan and states that she recognizes that she cannot manage at home right now. She states that she is hopeful to return home as soon as possible. Her husband was dissapointed that he could not get first choice of [facility] but was pleased with choice of [facility]. Nursing called report and CSW is signing off.

## 2014-03-31 NOTE — Progress Notes (Signed)
Ok per MD for d/c to SNF today. Patient/ husband wanted [facility] but they are unable to offer a bed.  [place] and they have an opening. Awaiting assignment of PASARR 30 day number due to mental health history.  If able to obtain number, will facilitate d/c via EMS today.  Lorie Phenix. Pauline Good, [place]

## 2014-03-31 NOTE — Progress Notes (Signed)
Report called to [facility] for patient. Patient and husband aware of golden living arrangements.

## 2014-04-01 ENCOUNTER — Telehealth: Payer: Self-pay | Admitting: *Deleted

## 2014-04-01 ENCOUNTER — Other Ambulatory Visit: Payer: Self-pay | Admitting: *Deleted

## 2014-04-01 LAB — CULTURE, BLOOD (ROUTINE X 2)
Culture: NO GROWTH
Culture: NO GROWTH

## 2014-04-01 MED ORDER — OXYCODONE HCL 5 MG PO TABS: ORAL_TABLET | ORAL | Status: DC

## 2014-04-01 NOTE — Telephone Encounter (Signed)
Per PCP, in my inbox.  Thanks.

## 2014-04-01 NOTE — Telephone Encounter (Signed)
Received fax to update Aranesp orders. OK to wait for Dr. Darnell Level to return? She was just D/C from [facility] 03/31/14. I put them in your IN box for review. Give them back to me either way. Thanks!

## 2014-04-01 NOTE — Telephone Encounter (Signed)
[facility] 

## 2014-04-04 ENCOUNTER — Telehealth: Payer: Self-pay | Admitting: *Deleted

## 2014-04-04 ENCOUNTER — Telehealth: Payer: Self-pay | Admitting: Family Medicine

## 2014-04-04 ENCOUNTER — Encounter (HOSPITAL_COMMUNITY): Payer: Medicare Other

## 2014-04-04 NOTE — Telephone Encounter (Signed)
I spoke with Olegario Shearer, one of the nurses at [facility]. She advised that patient would have to go to short stay for the injection. She would call and schedule her appointment.

## 2014-04-04 NOTE — Telephone Encounter (Signed)
Noted thanks °

## 2014-04-04 NOTE — Telephone Encounter (Signed)
Laverne with short stay at [facility] request updated orders faxed to [phone].

## 2014-04-04 NOTE — Telephone Encounter (Signed)
Transitional Care Call attempted.  Pt has been d/c'd to SNF.  She missed her Procrit injection today and the RN, Pam, at [facility] can take an order if you will write one (see Kim's telephone note from today).  She will call back to schedule a follow up once she is d/c'd home.

## 2014-04-04 NOTE — Telephone Encounter (Signed)
I spoke with [name] at Short Stay. Patient missed her procrit injection today. After some investigation-she had been inpatient and then d'c to SNL. I spoke with patient to let her know that I was putting in a call to the SNL to schedule an appt for patient to receive injection at short stay. While I leaving a message there, the patient left me a message that if we sent an order to the SNL, they could give her the injection there. Can you write an order for that or will she need labs, etc? They can do it today if we send the order soon. Otherwise it will be tomorrow.

## 2014-04-04 NOTE — Telephone Encounter (Signed)
Orders faxed earlier. Confirmed receipt with Laverne.

## 2014-04-05 ENCOUNTER — Encounter: Payer: Self-pay | Admitting: Internal Medicine

## 2014-04-05 ENCOUNTER — Telehealth: Payer: Self-pay | Admitting: Family Medicine

## 2014-04-05 ENCOUNTER — Non-Acute Institutional Stay (SKILLED_NURSING_FACILITY): Payer: Medicare Other | Admitting: Internal Medicine

## 2014-04-05 DIAGNOSIS — IMO0001 Reserved for inherently not codable concepts without codable children: Secondary | ICD-10-CM

## 2014-04-05 DIAGNOSIS — R5381 Other malaise: Secondary | ICD-10-CM

## 2014-04-05 DIAGNOSIS — M797 Fibromyalgia: Secondary | ICD-10-CM

## 2014-04-05 DIAGNOSIS — J4489 Other specified chronic obstructive pulmonary disease: Secondary | ICD-10-CM

## 2014-04-05 DIAGNOSIS — K7460 Unspecified cirrhosis of liver: Secondary | ICD-10-CM

## 2014-04-05 DIAGNOSIS — E1142 Type 2 diabetes mellitus with diabetic polyneuropathy: Secondary | ICD-10-CM

## 2014-04-05 DIAGNOSIS — I5032 Chronic diastolic (congestive) heart failure: Secondary | ICD-10-CM

## 2014-04-05 DIAGNOSIS — F333 Major depressive disorder, recurrent, severe with psychotic symptoms: Secondary | ICD-10-CM

## 2014-04-05 DIAGNOSIS — I509 Heart failure, unspecified: Secondary | ICD-10-CM

## 2014-04-05 DIAGNOSIS — E1042 Type 1 diabetes mellitus with diabetic polyneuropathy: Secondary | ICD-10-CM

## 2014-04-05 DIAGNOSIS — J449 Chronic obstructive pulmonary disease, unspecified: Secondary | ICD-10-CM

## 2014-04-05 DIAGNOSIS — K754 Autoimmune hepatitis: Secondary | ICD-10-CM

## 2014-04-05 DIAGNOSIS — K7469 Other cirrhosis of liver: Secondary | ICD-10-CM

## 2014-04-05 DIAGNOSIS — E1049 Type 1 diabetes mellitus with other diabetic neurological complication: Secondary | ICD-10-CM

## 2014-04-05 DIAGNOSIS — D500 Iron deficiency anemia secondary to blood loss (chronic): Secondary | ICD-10-CM

## 2014-04-05 NOTE — Progress Notes (Signed)
Patient ID: [name], female   DOB: May 15, 1941, 73 y.o.   MRN: [id number]  Location:  [facility] Provider:  Rexene Edison. Mariea Clonts, D.O., C.M.D.  Code Status:  Full  Chief Complaint  Patient presents with  . New Admit To SNF    HPI:  73 yo female here for short term rehab after hospitalization.  She has h/o chronic diastolic chf, NASH, dysphagia, obesity, copd on 3L O2, DMII on insulin, CKDIII, h/o GI bleed, htn, restless legs syndrome, iron deficiency anemia.  She had a fall 10 days prior to admission to hospital.  At the hospital, she was found to be in afib with mildly elevated troponin.  Echo was done.  She also was treated for an E coli UTI.  She needs a modified barium swallow done.  She receives procrit for her anemia of chronic disease (has appt fri).  She still has rib pain, has been using her puffer bid and feels hoarse.     Review of Systems:  Review of Systems  Constitutional: Negative for fever, chills, weight loss and malaise/fatigue.  HENT: Negative for congestion and hearing loss.        Hoarse  Eyes: Negative for blurred vision.  Respiratory: Positive for cough and shortness of breath.   Cardiovascular: Positive for leg swelling. Negative for chest pain.  Gastrointestinal: Negative for abdominal pain, constipation, blood in stool and melena.  Genitourinary: Negative for dysuria.  Musculoskeletal: Positive for myalgias.  Skin: Negative for rash.  Neurological: Negative for dizziness, weakness and headaches.  Psychiatric/Behavioral: Positive for depression. The patient is nervous/anxious.     Medications: Patient's Medications  New Prescriptions   ALPRAZOLAM (XANAX) 1 MG TABLET    GIVE 1 TABLET BY MOUTH EVERY 8 HOURS AS NEEDED FOR ANXIETY   DIPHENHYDRAMINE-ZINC ACETATE (BENADRYL) CREAM    Apply topically 3 (three) times daily as needed for itching.   INSULIN GLARGINE (LANTUS SOLOSTAR) 100 UNIT/ML SOLOSTAR PEN    Inject 80 Units into the skin at  bedtime.   LEVOFLOXACIN (LEVAQUIN) 750 MG TABLET    Take 1 tablet (750 mg total) by mouth every other day. For 3 more doses   LORATADINE (CLARITIN) 10 MG TABLET    Take 1 tablet (10 mg total) by mouth daily.   PANTOPRAZOLE (PROTONIX) 40 MG TABLET    Take 1 tablet (40 mg total) by mouth 2 (two) times daily.   PREDNISONE (DELTASONE) 10 MG TABLET    Prednisone dosing: Take  Prednisone 30mg  (3 tabs) x 3 days, then 20mg  (2 tabs) x 3days, then 10mg  (1 tab) x 3days, then OFF.  Dispense:   ROPINIROLE (REQUIP) 4 MG TABLET    TAKE 1 TABLET BY MOUTH AT BEDTIME   SPIRONOLACTONE (ALDACTONE) 25 MG TABLET    TAKE 1/2 TABLET EVERY DAY  Previous Medications   ALBUTEROL (PROAIR HFA) 108 (90 BASE) MCG/ACT INHALER    Inhale 2 puffs into the lungs every 6 (six) hours as needed for wheezing or shortness of breath.    B COMPLEX VITAMINS TABLET    Take 1 tablet by mouth daily.    BUPROPION (WELLBUTRIN XL) 150 MG 24 HR TABLET    Take 150 mg by mouth daily.   DESVENLAFAXINE (PRISTIQ) 100 MG 24 HR TABLET    Take 150 mg by mouth daily. Takes with a 50mg  tablet total of 150mg  daily   DESVENLAFAXINE (PRISTIQ) 50 MG 24 HR TABLET    Take 50 mg by mouth daily. Takes with  a 100mg  tablet, total daily dose is 150mg    GUAIFENESIN (MUCINEX) 600 MG 12 HR TABLET    Take 600 mg by mouth 2 (two) times daily. 2 tablet by mouth once daily   LEVALBUTEROL (XOPENEX) 0.63 MG/3ML NEBULIZER SOLUTION    Take 0.63 mg by nebulization every 8 (eight) hours as needed for shortness of breath.    MULTIPLE VITAMINS-MINERALS (CENTRUM SILVER PO)    Take 1 tablet by mouth daily.    NON FORMULARY    3 liters oxygen 24/7   POLYETHYLENE GLYCOL (MIRALAX / GLYCOLAX) PACKET    Take 17 g by mouth daily as needed for moderate constipation.   QUETIAPINE (SEROQUEL) 400 MG TABLET    Take 400 mg by mouth at bedtime.   SALINE (OCEAN NASAL SPRAY NA)    Place 1 spray into the nose 3 (three) times daily as needed (congestion).   Modified Medications   Modified  Medication Previous Medication   FLUTICASONE-SALMETEROL (ADVAIR) 250-50 MCG/DOSE AEPB Fluticasone-Salmeterol (ADVAIR) 250-50 MCG/DOSE AEPB      Inhale 1 puff into the lungs 2 (two) times daily.    Inhale 1 puff into the lungs 2 (two) times daily.   FUROSEMIDE (LASIX) 40 MG TABLET furosemide (LASIX) 40 MG tablet      Take 1 tablets (40 mg) in the morning and 1/2 tablet in the afternoon    Take 1 tablet (40 mg total) by mouth 2 (two) times daily. Take 1 1/2 tablets (60 mg) in the morning and 1 tablet in the afternoon   INSULIN LISPRO (HUMALOG) 100 UNIT/ML KIWKPEN insulin lispro (HUMALOG) 100 UNIT/ML KiwkPen      Inject 15-30 units into the skin at supper per sliding scale    Inject 15-30 Units into the skin daily with supper. SSI   INSULIN PEN NEEDLE (B-D ULTRAFINE III SHORT PEN) 31G X 8 MM MISC B-D ULTRAFINE III SHORT PEN 31G X 8 MM MISC      Use to inject insulin 2 times daily as instructed.       LACTULOSE, ENCEPHALOPATHY, (CHRONULAC) 10 GM/15ML SOLN lactulose, encephalopathy, (CHRONULAC) 10 GM/15ML SOLN      Take 30 mLs (20 g total) by mouth 2 (two) times daily. constipation    Take 30 mLs (20 g total) by mouth daily. constipation   MUPIROCIN CREAM (BACTROBAN) 2 % mupirocin cream (BACTROBAN) 2 %      APPLY 1 APPLICATION TOPICALLY 2 (TWO) TIMES DAILY. FOR SKIN LESIONS    APPLY 1 APPLICATION TOPICALLY 2 (TWO) TIMES DAILY. FOR SKIN LESIONS   NADOLOL (CORGARD) 20 MG TABLET nadolol (CORGARD) 20 MG tablet      TAKE 1/2 TABLET (10 MG TOTAL) BY MOUTH DAILY.    Take 0.5 tablets (10 mg total) by mouth daily.   OXYCODONE (OXY IR/ROXICODONE) 5 MG IMMEDIATE RELEASE TABLET oxyCODONE (OXY IR/ROXICODONE) 5 MG immediate release tablet      Take one tablet by mouth every 4 hours as needed for pain    Take one tablet by mouth every 4 hours as needed for pain   PROMETHAZINE (PHENERGAN) 25 MG TABLET promethazine (PHENERGAN) 25 MG tablet      Take 1 tablet (25 mg total) by mouth 2 (two) times daily as needed for  nausea.    Take 1 tablet (25 mg total) by mouth 2 (two) times daily as needed for nausea.   SIMVASTATIN (ZOCOR) 40 MG TABLET simvastatin (ZOCOR) 40 MG tablet      TAKE 1 TABLET BY MOUTH  EVERY EVENING    Take 1 tablet (40 mg total) by mouth daily.   VITAMIN D, ERGOCALCIFEROL, (DRISDOL) 50000 UNITS CAPS CAPSULE Vitamin D, Ergocalciferol, (DRISDOL) 50000 UNITS CAPS capsule      TAKE 1 CAPSULE (50,000 UNITS TOTAL) BY MOUTH EVERY 7 (SEVEN) DAYS. TAKE ON FRIDAY    TAKE 1 CAPSULE (50,000 UNITS TOTAL) BY MOUTH EVERY 7 (SEVEN) DAYS. TAKE ON FRIDAY   XIFAXAN 550 MG TABS TABLET XIFAXAN 550 MG TABS tablet      TAKE 1 TABLET (550 MG TOTAL) BY MOUTH DAILY.    TAKE 1 TABLET (550 MG TOTAL) BY MOUTH DAILY.  Discontinued Medications   ALPRAZOLAM (XANAX) 1 MG TABLET    Take 1 mg by mouth 3 (three) times daily as needed for anxiety.    CEFPODOXIME (VANTIN) 200 MG TABLET    Take 1 tablet (200 mg total) by mouth every 12 (twelve) hours.   FLUTICASONE-SALMETEROL (ADVAIR) 250-50 MCG/DOSE AEPB    Inhale 1 puff into the lungs 2 (two) times daily.   INSULIN GLARGINE (LANTUS) 100 UNIT/ML INJECTION    Inject 0.8 mLs (80 Units total) into the skin at bedtime.   LACTULOSE, ENCEPHALOPATHY, (CHRONULAC) 10 GM/15ML SOLN    Take 20 g by mouth daily. constipation   LEVALBUTEROL (XOPENEX) 0.63 MG/3ML NEBULIZER SOLUTION    Take 0.63 mg by nebulization every 4 (four) hours as needed for wheezing or shortness of breath.   NADOLOL (CORGARD) 20 MG TABLET    Take 10 mg by mouth daily.   PANTOPRAZOLE (PROTONIX) 40 MG TABLET    Take 40 mg by mouth 2 (two) times daily.    PREDNISONE (DELTASONE) 20 MG TABLET    Take 2 tablets for 3 days then 1 tablet for 2 days then stopped.   RIFAXIMIN (XIFAXAN) 550 MG TABS TABLET    Take 1 tablet (550 mg total) by mouth daily.   ROPINIROLE (REQUIP) 4 MG TABLET    Take 4 mg by mouth at bedtime.   SIMVASTATIN (ZOCOR) 40 MG TABLET    Take 40 mg by mouth daily.   VITAMIN D, ERGOCALCIFEROL, (DRISDOL) 50000 UNITS  CAPS CAPSULE    Take 50,000 Units by mouth every 7 (seven) days. On Fridays    Physical Exam: Filed Vitals:   04/05/14 1215  BP: 118/71  Pulse: 63  Temp: 98.2 F (36.8 C)  Resp: 18  Height: 5\' 4"  (1.626 m)  Weight: 239 lb (108.41 kg)  SpO2: 98%  Physical Exam  Constitutional: She is oriented to person, place, and time. No distress.  HENT:  Head: Normocephalic and atraumatic.  Eyes: EOM are normal. Pupils are equal, round, and reactive to light.  Neck: Normal range of motion. Neck supple. No JVD present.  Cardiovascular: Normal rate, regular rhythm, normal heart sounds and intact distal pulses.   Pulmonary/Chest: Effort normal. She has wheezes.  Abdominal: Soft. Bowel sounds are normal. She exhibits no distension and no mass. There is no tenderness.  Musculoskeletal: Normal range of motion.  Neurological: She is alert and oriented to person, place, and time.  Skin: Skin is warm and dry.  Psychiatric:  Flat affect     Labs reviewed: Basic Metabolic Panel:  Recent Labs  06/09/14 1436  08/14/14 0327 08/15/14 0402 08/16/14 0438  NA 140  < > 137 134* 137  K 3.6  < > 5.3 5.1 4.2  CL 103  < > 103 102 105  CO2 30  < > 21 21 24   GLUCOSE 182*  < >  289* 216* 228*  BUN 33*  < > 36* 32* 32*  CREATININE 1.1  < > 1.49* 1.36* 1.40*  CALCIUM 9.0  < > 9.4 9.4 9.2  PHOS 3.7  --   --   --   --   < > = values in this interval not displayed.  Liver Function Tests:  Recent Labs  07/07/14 1101 08/11/14 1755 08/12/14 0615  AST 38* 33 32  ALT 36* 29 26  ALKPHOS 119* 124* 112  BILITOT 1.3* 0.8 0.7  PROT 6.5 6.9 6.1  ALBUMIN 3.0* 3.5 3.1*    CBC:  Recent Labs  07/28/14 1631  08/11/14 1755 08/12/14 0615  08/14/14 0327 08/15/14 0402 08/16/14 0438  WBC 3.4*  < > 4.5 2.5*  < > 2.7* 3.0* 3.5*  NEUTROABS 2.6  --  3.4 1.8  --   --   --   --   HGB 9.6*  < > 9.5* 8.3*  < > 8.9* 7.6* 9.6*  HCT 29.2*  < > 28.8* 25.8*  < > 27.4* 24.1* 28.8*  MCV 99.2  < > 98.6 101.2*  < >  102.6* 100.4* 98.6  PLT 86.0*  < > 91* 73*  < > 69* 60* 67*  < > = values in this interval not displayed.  Assessment/Plan 1. Chronic diastolic CHF (congestive heart failure) -daily weights, fluid monitoring -cont current regimen and cardiac prudent diet  2. COPD (chronic obstructive pulmonary disease) with chronic bronchitis -cont current regimen and oxygen therapy  3. Other cirrhosis of liver -NASH -cont current regimen  4. Autoimmune hepatitis -cause of cirrhosis  5. Type 1 diabetes mellitus with diabetic polyneuropathy -follows with endocrine -cont current regimen -may need adjustments due to acute illnesses  6. Fibromyalgia -cont current pain and psychiatric meds  7. Severe recurrent major depressive disorder with psychotic features -cont current regimen  8. Physical deconditioning -here for pt, ot, and st -check modified barium swallow due to dysphagia  9. Iron deficiency anemia due to chronic blood loss -keep procrit appt for friday   Family/ staff Communication: seen with unit supervisor  Goals of care:  Here for rehab, to return home  Labs/tests ordered:  Cbc, bmp, modified barium swallow

## 2014-04-05 NOTE — Telephone Encounter (Signed)
Opened in error

## 2014-04-07 ENCOUNTER — Other Ambulatory Visit (HOSPITAL_COMMUNITY): Payer: Self-pay | Admitting: *Deleted

## 2014-04-08 ENCOUNTER — Encounter: Payer: Self-pay | Admitting: *Deleted

## 2014-04-08 ENCOUNTER — Encounter (HOSPITAL_COMMUNITY)
Admission: RE | Admit: 2014-04-08 | Discharge: 2014-04-08 | Disposition: A | Discharge status: Home / Self Care | Payer: Medicare Other | Source: Ambulatory Visit | Attending: Pulmonary Disease | Admitting: Pulmonary Disease

## 2014-04-08 DIAGNOSIS — D509 Iron deficiency anemia, unspecified: Secondary | ICD-10-CM | POA: Diagnosis present

## 2014-04-08 LAB — POCT HEMOGLOBIN-HEMACUE: Hemoglobin: 9.3 g/dL — ABNORMAL LOW (ref 12.0–15.0)

## 2014-04-08 MED ORDER — DARBEPOETIN ALFA-POLYSORBATE 200 MCG/0.4ML IJ SOLN
INTRAMUSCULAR | Status: AC
  Administered 2014-04-08: 200 ug via SUBCUTANEOUS
  Filled 2014-04-08: qty 0.4

## 2014-04-08 MED ORDER — DARBEPOETIN ALFA-POLYSORBATE 200 MCG/0.4ML IJ SOLN
200.0000 ug | INTRAMUSCULAR | Status: DC
  Administered 2014-04-08: 200 ug via SUBCUTANEOUS

## 2014-04-12 ENCOUNTER — Other Ambulatory Visit: Payer: Self-pay | Admitting: *Deleted

## 2014-04-12 MED ORDER — ALPRAZOLAM 1 MG PO TABS: ORAL_TABLET | ORAL | Status: DC

## 2014-04-12 NOTE — Telephone Encounter (Signed)
[facility] 

## 2014-04-19 ENCOUNTER — Encounter: Payer: Self-pay | Admitting: Pulmonary Disease

## 2014-04-22 ENCOUNTER — Encounter: Payer: Self-pay | Admitting: Internal Medicine

## 2014-04-22 ENCOUNTER — Telehealth: Payer: Self-pay | Admitting: Family Medicine

## 2014-04-22 ENCOUNTER — Encounter (HOSPITAL_COMMUNITY)
Admission: RE | Admit: 2014-04-22 | Discharge: 2014-04-22 | Disposition: A | Discharge status: Home / Self Care | Payer: Medicare Other | Source: Ambulatory Visit | Attending: Family Medicine | Admitting: Family Medicine

## 2014-04-22 ENCOUNTER — Non-Acute Institutional Stay (SKILLED_NURSING_FACILITY): Payer: Medicare Other | Admitting: Internal Medicine

## 2014-04-22 DIAGNOSIS — J449 Chronic obstructive pulmonary disease, unspecified: Secondary | ICD-10-CM

## 2014-04-22 DIAGNOSIS — J9610 Chronic respiratory failure, unspecified whether with hypoxia or hypercapnia: Secondary | ICD-10-CM

## 2014-04-22 DIAGNOSIS — M1990 Unspecified osteoarthritis, unspecified site: Secondary | ICD-10-CM

## 2014-04-22 DIAGNOSIS — F333 Major depressive disorder, recurrent, severe with psychotic symptoms: Secondary | ICD-10-CM

## 2014-04-22 DIAGNOSIS — K5901 Slow transit constipation: Secondary | ICD-10-CM

## 2014-04-22 DIAGNOSIS — D509 Iron deficiency anemia, unspecified: Secondary | ICD-10-CM | POA: Diagnosis not present

## 2014-04-22 DIAGNOSIS — K219 Gastro-esophageal reflux disease without esophagitis: Secondary | ICD-10-CM

## 2014-04-22 LAB — PREPARE RBC (CROSSMATCH)

## 2014-04-22 LAB — POCT HEMOGLOBIN-HEMACUE: Hemoglobin: 7.7 g/dL — ABNORMAL LOW (ref 12.0–15.0)

## 2014-04-22 MED ORDER — DARBEPOETIN ALFA-POLYSORBATE 200 MCG/0.4ML IJ SOLN
200.0000 ug | INTRAMUSCULAR | Status: DC
  Administered 2014-04-22: 200 ug via SUBCUTANEOUS

## 2014-04-22 MED ORDER — DARBEPOETIN ALFA-POLYSORBATE 200 MCG/0.4ML IJ SOLN
INTRAMUSCULAR | Status: AC
  Filled 2014-04-22: qty 0.4

## 2014-04-22 NOTE — Progress Notes (Signed)
Patient ID: [name], female   DOB: 1941-06-08, 73 y.o.   MRN: [id number]     Facility: [facility]  Chief complaint- discharge visit  Allergies reviewed  HPI 73 y/o female patient is seen today for discharge visit. Patient was here for short term rehabilitation and has worked with therapy team. She has history of HTN, DM, CHF, COPD on home o2, depression, RLS and liver cirrhosis among others. She was treated for e.coli uti and copd exacerbation along with acute encephalopathy. She is medically stable to be discharged home. She is seen in her room today and denies any concerns  Review of system Constitutional: Negative for fever, chills HENT: Negative for congestion.  Respiratory: Negative for cough, sputum production, shortness of breath and wheezing.   Cardiovascular: Negative for chest pain, palpitations, leg swelling.  Gastrointestinal: Negative for heartburn, nausea, vomiting, abdominal pain, constipation Genitourinary: Negative for dysuria Musculoskeletal: Negative for back pain, falls Skin: Negative for itching and rash.   Medication reviewed. See [facility]   Medication List       This list is accurate as of: 04/22/14 11:59 PM.  Always use your most recent med list.               ALPRAZolam 1 MG tablet  Commonly known as:  XANAX  Take one tablet by mouth every 8 hours as needed for rest     b complex vitamins tablet  Take 1 tablet by mouth daily.     buPROPion 150 MG 24 hr tablet  Commonly known as:  WELLBUTRIN XL  Take 150 mg by mouth daily.     CENTRUM SILVER PO  Take 1 tablet by mouth daily.     desvenlafaxine 50 MG 24 hr tablet  Commonly known as:  PRISTIQ  Take 50 mg by mouth daily. Takes with a 100mg  tablet, total daily dose is 150mg      desvenlafaxine 100 MG 24 hr tablet  Commonly known as:  PRISTIQ  Take 100 mg by mouth daily. Takes with a 50mg  tablet, total daily dose is 150mg      Fluticasone-Salmeterol 250-50 MCG/DOSE Aepb    Commonly known as:  ADVAIR  Inhale 1 puff into the lungs 2 (two) times daily.     furosemide 40 MG tablet  Commonly known as:  LASIX  Take 1 tablet (40 mg total) by mouth 2 (two) times daily. Take 1 1/2 tablets (60 mg) in the morning and 1 tablet in the afternoon     guaiFENesin 600 MG 12 hr tablet  Commonly known as:  MUCINEX  Take 600 mg by mouth 2 (two) times daily. 2 tablet by mouth once daily     insulin glargine 100 UNIT/ML injection  Commonly known as:  LANTUS  Inject 0.8 mLs (80 Units total) into the skin at bedtime.     insulin lispro 100 UNIT/ML KiwkPen  Commonly known as:  HUMALOG  Inject 5 Units into the skin daily with supper.     lactulose (encephalopathy) 10 GM/15ML Soln  Commonly known as:  CHRONULAC  Take 20 g by mouth daily. constipation     nadolol 20 MG tablet  Commonly known as:  CORGARD  Take 10 mg by mouth daily.     OCEAN NASAL SPRAY NA  Place 1 spray into the nose 3 (three) times daily as needed (congestion).     oxyCODONE 5 MG immediate release tablet  Commonly known as:  Oxy IR/ROXICODONE  Take one tablet by mouth  every 4 hours as needed for pain     pantoprazole 40 MG tablet  Commonly known as:  PROTONIX  Take 40 mg by mouth 2 (two) times daily.     PROAIR HFA 108 (90 BASE) MCG/ACT inhaler  Generic drug:  albuterol  Inhale 2 puffs into the lungs every 6 (six) hours as needed for wheezing or shortness of breath.     promethazine 25 MG tablet  Commonly known as:  PHENERGAN  Take 1 tablet (25 mg total) by mouth 2 (two) times daily as needed for nausea.     QUEtiapine 400 MG tablet  Commonly known as:  SEROQUEL  Take 400 mg by mouth at bedtime.     rifaximin 550 MG Tabs tablet  Commonly known as:  XIFAXAN  Take 1 tablet (550 mg total) by mouth daily.     rOPINIRole 4 MG tablet  Commonly known as:  REQUIP  Take 4 mg by mouth at bedtime.     simvastatin 40 MG tablet  Commonly known as:  ZOCOR  Take 40 mg by mouth daily.     Vitamin  D (Ergocalciferol) 50000 UNITS Caps capsule  Commonly known as:  DRISDOL  Take 50,000 Units by mouth every 7 (seven) days. On Fridays        Physical exam BP 140/84  Pulse 64  Temp(Src) 98.2 F (36.8 C)  Resp 18  Ht 5\' 4"  (1.626 m)  Wt 226 lb (102.513 kg)  BMI 38.77 kg/m2  SpO2 95%  General- elderly female in no acute distress, obese Head- atraumatic, normocephalic Cardiovascular- normal s1,s2, no murmurs Respiratory- bilateral clear to auscultation, no wheeze, no rhonchi, no crackles, o2 by [place] Abdomen- bowel sounds present, soft, non tender Musculoskeletal- able to move all 4 extremities, on wheelchair Neurological- no focal deficit Psychiatry- alert and oriented to person, place and time, normal mood and affect   Labs reviewed  Assessment/plan  She will need home health services for PT and OT. She has oxygen at home and will need to use it at 3l/min. She has diabetic supplies and nebulizer machine at home  She has muscle wasting and atrophy which limits her ability of performing daily activities. She will need a wheelchair to help perform activities in a safe manner. She is able to propel her wheelchair. She has chf and copd and is on chronic oxygen and is unable to lie down flat. She will need the head end of her bed propped at 30 degrees and she will benefit from a hospital bed.  DME required: rolling walker with skis, hospital bed, standard wheelchair with removable leg rests and drop arm, 7 foot ramp  PCP follow up in 1-2 weeks post discharge. Social work department of facility to set this up  30 days supply of prescription medications provided  Plan of care discussed with patient and verbalizes understanding this. Reviewed care plan with nursing staff

## 2014-04-22 NOTE — Progress Notes (Signed)
Called and spoke with Dr. Danise Mina about patient's hgb of 7.7.  Received orders to transfuse 1 unit of PRBC and to give injection of aranesp today.  Will schedule patient to receive transfusion.

## 2014-04-22 NOTE — Telephone Encounter (Signed)
Order in your IN box for signature.

## 2014-04-22 NOTE — Telephone Encounter (Signed)
Received call from short stay. Hgb 7.7. rec aranesp injection today and return on Monday for 1 u pRBC with 20mg  lasix IV afterwards. plz fax order for this to short stay.

## 2014-04-22 NOTE — Telephone Encounter (Signed)
Order faxed. Patient notified and has appt scheduled for 8am 04/25/14.

## 2014-04-22 NOTE — Telephone Encounter (Signed)
Error

## 2014-04-22 NOTE — Telephone Encounter (Signed)
signed and placed in Kim's box. 

## 2014-04-25 ENCOUNTER — Encounter (HOSPITAL_COMMUNITY)
Admission: RE | Admit: 2014-04-25 | Discharge: 2014-04-25 | Disposition: A | Discharge status: Home / Self Care | Payer: Medicare Other | Source: Ambulatory Visit | Attending: Family Medicine | Admitting: Family Medicine

## 2014-04-25 DIAGNOSIS — D509 Iron deficiency anemia, unspecified: Secondary | ICD-10-CM | POA: Diagnosis not present

## 2014-04-25 MED ORDER — FUROSEMIDE 10 MG/ML IJ SOLN
20.0000 mg | Freq: Once | INTRAMUSCULAR | Status: AC
  Administered 2014-04-25: 20 mg via INTRAVENOUS

## 2014-04-25 MED ORDER — FUROSEMIDE 10 MG/ML IJ SOLN
INTRAMUSCULAR | Status: AC
Start: 2014-04-25 — End: 2014-04-25
  Filled 2014-04-25: qty 2

## 2014-04-25 MED ORDER — SODIUM CHLORIDE 0.9 % IV SOLN: Freq: Once | INTRAVENOUS | Status: DC

## 2014-04-26 DIAGNOSIS — Z5189 Encounter for other specified aftercare: Secondary | ICD-10-CM

## 2014-04-26 DIAGNOSIS — N390 Urinary tract infection, site not specified: Secondary | ICD-10-CM

## 2014-04-26 DIAGNOSIS — J189 Pneumonia, unspecified organism: Secondary | ICD-10-CM

## 2014-04-26 DIAGNOSIS — IMO0001 Reserved for inherently not codable concepts without codable children: Secondary | ICD-10-CM

## 2014-04-26 LAB — TYPE AND SCREEN
ABO/RH(D): O POS
ANTIBODY SCREEN: POSITIVE
DAT, IGG: NEGATIVE
Donor AG Type: NEGATIVE
Unit division: 0

## 2014-04-28 ENCOUNTER — Telehealth: Payer: Self-pay | Admitting: Pulmonary Disease

## 2014-04-28 MED ORDER — FLUTICASONE-SALMETEROL 250-50 MCG/DOSE IN AEPB: 1.0000 | INHALATION_SPRAY | Freq: Two times a day (BID) | RESPIRATORY_TRACT | Status: DC

## 2014-04-28 NOTE — Telephone Encounter (Signed)
Pt aware, samples left at front. Clarendon Bing, CMA

## 2014-05-05 ENCOUNTER — Telehealth: Payer: Self-pay | Admitting: Family Medicine

## 2014-05-05 ENCOUNTER — Encounter: Payer: Self-pay | Admitting: *Deleted

## 2014-05-05 ENCOUNTER — Ambulatory Visit (INDEPENDENT_AMBULATORY_CARE_PROVIDER_SITE_OTHER): Payer: Medicare Other | Admitting: Family Medicine

## 2014-05-05 ENCOUNTER — Encounter: Payer: Self-pay | Admitting: Family Medicine

## 2014-05-05 VITALS — BP 130/60 | HR 52 | Temp 98.1°F | Wt 227.5 lb

## 2014-05-05 DIAGNOSIS — K7460 Unspecified cirrhosis of liver: Secondary | ICD-10-CM

## 2014-05-05 DIAGNOSIS — N289 Disorder of kidney and ureter, unspecified: Secondary | ICD-10-CM

## 2014-05-05 DIAGNOSIS — K754 Autoimmune hepatitis: Secondary | ICD-10-CM

## 2014-05-05 DIAGNOSIS — I5032 Chronic diastolic (congestive) heart failure: Secondary | ICD-10-CM

## 2014-05-05 DIAGNOSIS — I10 Essential (primary) hypertension: Secondary | ICD-10-CM

## 2014-05-05 DIAGNOSIS — D500 Iron deficiency anemia secondary to blood loss (chronic): Secondary | ICD-10-CM

## 2014-05-05 DIAGNOSIS — Z23 Encounter for immunization: Secondary | ICD-10-CM

## 2014-05-05 DIAGNOSIS — I509 Heart failure, unspecified: Secondary | ICD-10-CM

## 2014-05-05 DIAGNOSIS — N390 Urinary tract infection, site not specified: Secondary | ICD-10-CM

## 2014-05-05 LAB — POCT URINALYSIS DIPSTICK
Bilirubin, UA: NEGATIVE
Blood, UA: NEGATIVE
GLUCOSE UA: NEGATIVE
Ketones, UA: NEGATIVE
NITRITE UA: POSITIVE
Protein, UA: NEGATIVE
Spec Grav, UA: 1.015
UROBILINOGEN UA: 0.2
pH, UA: 6.5

## 2014-05-05 LAB — COMPREHENSIVE METABOLIC PANEL
ALBUMIN: 3 g/dL — AB (ref 3.5–5.2)
ALK PHOS: 119 U/L — AB (ref 39–117)
ALT: 23 U/L (ref 0–35)
AST: 39 U/L — ABNORMAL HIGH (ref 0–37)
BILIRUBIN TOTAL: 1.9 mg/dL — AB (ref 0.2–1.2)
BUN: 14 mg/dL (ref 6–23)
CO2: 30 mEq/L (ref 19–32)
Calcium: 8.9 mg/dL (ref 8.4–10.5)
Chloride: 103 mEq/L (ref 96–112)
Creatinine, Ser: 0.9 mg/dL (ref 0.4–1.2)
GFR: 64.81 mL/min (ref >60.00)
Glucose, Bld: 158 mg/dL — ABNORMAL HIGH (ref 70–99)
POTASSIUM: 3.6 meq/L (ref 3.5–5.1)
SODIUM: 140 meq/L (ref 135–145)
Total Protein: 6.1 g/dL (ref 6.0–8.3)

## 2014-05-05 NOTE — Progress Notes (Signed)
Pre visit review using our clinic review tool, if applicable. No additional management support is needed unless otherwise documented below in the visit note. 

## 2014-05-05 NOTE — Assessment & Plan Note (Signed)
Check CMP today. Pt states cannot get this drawn at short stay tomorrow so will check CBC alone tomorrow. Will also check for Hep A/B immunity status with today's blood draw.

## 2014-05-05 NOTE — Patient Instructions (Signed)
Flu shot today. Urine checked today. Blood work today. Good to see you today, return to see me in 4-6 weeks for follow up

## 2014-05-05 NOTE — Telephone Encounter (Signed)
Pt called wanting to let you know that her home nurse did not order a culture of the urine and only ordered the abx.   # [id number]

## 2014-05-05 NOTE — Addendum Note (Signed)
Addended by: Ria Bush on: 05/05/2014 02:20 PM   Modules accepted: Orders

## 2014-05-05 NOTE — Assessment & Plan Note (Signed)
noted leg swelling today - pt self restarted spironolactone over last 2 weeks, continues lasix 60mg  in am and 40mg  in pm. Check Cr and K today.

## 2014-05-05 NOTE — Addendum Note (Signed)
Addended by: Royann Shivers A on: 05/05/2014 01:02 PM   Modules accepted: Orders

## 2014-05-05 NOTE — Telephone Encounter (Signed)
Spoke with patient. She will start abx and wait for culture results.

## 2014-05-05 NOTE — Assessment & Plan Note (Signed)
Continue aranesp Q2 wks. Consider 2u at a time.

## 2014-05-05 NOTE — Progress Notes (Signed)
BP 130/60  Pulse 52  Temp(Src) 98.1 F (36.7 C) (Oral)  Wt 227 lb 8 oz (103.193 kg)   CC: f/u visit  Subjective:    Patient ID: [name], female    DOB: [birth year], 73 y.o.   MRN: [id number]  HPI: [name] is a 73 y.o. female presenting on 05/05/2014 for Follow-up   Recently discharged from [facility] after prolonged stay since 03/31/2014 after hospitalization with sternal fracture and AMS. Treated for E coli UTI and COPD exacerbation along with acute encephalopathy. Discharged 04/22/2014. First few days felt well, then her anxiety started worsening. Persistent sternal pain from fracture. Has increased alprazolam to 1mg  tid recently, prior only took 1-2 per day.  Only UTI symptom was cloudy urine. Endorses persistent cloudy urine. Does endorse trouble completely emptying along with dysuria yesterday. States Arbie Cookey Spanks ordered macrodantin yesterday.   Spironolactone was discontinued during hospitalization. Since she's been home, she has been taking spironolactone 12.5mg  1/2 tab daily "it was on my medication list".   On 3L home O2 and has [facility] set up. Mliss Sax comes out regularly once a week as well. Prescribed hospital bed and wheelchair and walker at discharge. Lab Results  Component Value Date   HGB 7.7* 04/22/2014   DM - home sugars have been stable 100-150. Checks fasting and prior to dinner. Denies low sugars. Lab Results  Component Value Date   HGBA1C 5.3 03/22/2014   Wt Readings from Last 3 Encounters:  05/05/14 227 lb 8 oz (103.193 kg)  04/25/14 242 lb (109.77 kg)  04/22/14 226 lb (102.513 kg)   Body mass index is 39.03 kg/(m^2).  Relevant past medical, surgical, family and social history reviewed and updated as indicated.  Allergies and medications reviewed and updated. Current Outpatient Prescriptions on File Prior to Visit  Medication Sig  . albuterol (PROAIR HFA) 108 (90 BASE) MCG/ACT inhaler Inhale 2 puffs into the lungs every 6 (six) hours as needed  for wheezing or shortness of breath.   [name] b complex vitamins tablet Take 1 tablet by mouth daily.   [name] buPROPion (WELLBUTRIN XL) 150 MG 24 hr tablet Take 150 mg by mouth daily.  [name] desvenlafaxine (PRISTIQ) 100 MG 24 hr tablet Take 100 mg by mouth daily. Takes with a 50mg  tablet, total daily dose is 150mg   . desvenlafaxine (PRISTIQ) 50 MG 24 hr tablet Take 50 mg by mouth daily. Takes with a 100mg  tablet, total daily dose is 150mg   . Fluticasone-Salmeterol (ADVAIR) 250-50 MCG/DOSE AEPB Inhale 1 puff into the lungs 2 (two) times daily.  . furosemide (LASIX) 40 MG tablet Take 1 tablet (40 mg total) by mouth 2 (two) times daily. Take 1 1/2 tablets (60 mg) in the morning and 1 tablet in the afternoon  . guaiFENesin (MUCINEX) 600 MG 12 hr tablet Take 600 mg by mouth 2 (two) times daily. 2 tablet by mouth once daily  . insulin glargine (LANTUS) 100 UNIT/ML injection Inject 0.8 mLs (80 Units total) into the skin at bedtime.  . insulin lispro (HUMALOG) 100 UNIT/ML KiwkPen Inject 15-30 Units into the skin daily with supper. SSI  . lactulose, encephalopathy, (CHRONULAC) 10 GM/15ML SOLN Take 20 g by mouth daily. constipation  . Multiple Vitamins-Minerals (CENTRUM SILVER PO) Take 1 tablet by mouth daily.   . nadolol (CORGARD) 20 MG tablet Take 10 mg by mouth daily.  [name] oxyCODONE (OXY IR/ROXICODONE) 5 MG immediate release tablet Take one tablet by mouth every 4 hours as needed for pain  .  pantoprazole (PROTONIX) 40 MG tablet Take 40 mg by mouth 2 (two) times daily.   . promethazine (PHENERGAN) 25 MG tablet Take 1 tablet (25 mg total) by mouth 2 (two) times daily as needed for nausea.  [name] QUEtiapine (SEROQUEL) 400 MG tablet Take 400 mg by mouth at bedtime.  . rifaximin (XIFAXAN) 550 MG TABS tablet Take 1 tablet (550 mg total) by mouth daily.  [name] rOPINIRole (REQUIP) 4 MG tablet Take 4 mg by mouth at bedtime.  . Saline (OCEAN NASAL SPRAY NA) Place 1 spray into the nose 3 (three) times daily as needed (congestion).   .  simvastatin (ZOCOR) 40 MG tablet Take 40 mg by mouth daily.  . Vitamin D, Ergocalciferol, (DRISDOL) 50000 UNITS CAPS capsule Take 50,000 Units by mouth every 7 (seven) days. On Fridays   No current facility-administered medications on file prior to visit.    Review of Systems Per HPI unless specifically indicated above    Objective:    BP 130/60  Pulse 52  Temp(Src) 98.1 F (36.7 C) (Oral)  Wt 227 lb 8 oz (103.193 kg)  Physical Exam  Nursing note and vitals reviewed. Constitutional: She appears well-developed and well-nourished. No distress.  Morbidly obese, in wheelchair, chronically ill appearing  HENT:  Mouth/Throat: Oropharynx is clear and moist. No oropharyngeal exudate.  Cardiovascular: Normal rate, regular rhythm and intact distal pulses.   Murmur (3/6 SEM) heard. Pulmonary/Chest: Effort normal and breath sounds normal. No respiratory distress. She has no wheezes. She has no rales.  Musculoskeletal: She exhibits edema (1+ pedal edema).  Skin: There is pallor.       Assessment & Plan:   Problem List Items Addressed This Visit   UTI (lower urinary tract infection) - Primary     Recently treated for UTI with encephalopathy. Now endorsing mild dysuria and cloudy urine. Pt states home NP ordered macrobid which she hasn't started yet.  Will check UA and send for culture if abnormal.    Renal insufficiency     Check Cr today.    IRON DEFICIENCY ANEMIA SECONDARY TO BLOOD LOSS (Chronic)     Continue aranesp Q2 wks. Consider 2u at a time.    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 Pt states cannot get this drawn at short stay tomorrow so will check CBC alone tomorrow. Will also check for Hep A/B immunity status with today's blood draw.    Relevant Orders      Comprehensive metabolic panel      Hepatitis B surface antibody      Hepatitis A  antibody, total   Chronic diastolic CHF (congestive heart failure) (Chronic)     noted leg swelling today - pt self restarted spironolactone over last 2 weeks, continues lasix 60mg  in am and 40mg  in pm. Check Cr and K today.    Relevant Medications      spironolactone (ALDACTONE) tablet   Autoimmune hepatitis (Chronic)    Other Visit Diagnoses   Need for prophylactic vaccination and inoculation against influenza        Relevant Orders       Flu Vaccine QUAD 36+ mos PF IM (Fluarix Quad PF) (Completed)        Follow up plan: Return in about 6 weeks (around 06/16/2014), or if symptoms worsen or fail to improve, for follow up visit.

## 2014-05-05 NOTE — Assessment & Plan Note (Signed)
Recently treated for UTI with encephalopathy. Now endorsing mild dysuria and cloudy urine. Pt states home NP ordered macrobid which she hasn't started yet.  Will check UA and send for culture if abnormal.

## 2014-05-05 NOTE — Assessment & Plan Note (Signed)
Check Cr today. 

## 2014-05-06 ENCOUNTER — Encounter (HOSPITAL_COMMUNITY)
Admission: RE | Admit: 2014-05-06 | Discharge: 2014-05-06 | Disposition: A | Discharge status: Home / Self Care | Payer: Medicare Other | Source: Ambulatory Visit | Attending: Pulmonary Disease | Admitting: Pulmonary Disease

## 2014-05-06 DIAGNOSIS — D509 Iron deficiency anemia, unspecified: Secondary | ICD-10-CM | POA: Insufficient documentation

## 2014-05-06 LAB — HEPATITIS B SURFACE ANTIBODY,QUALITATIVE: HEP B S AB: NEGATIVE

## 2014-05-06 LAB — HEPATITIS A ANTIBODY, TOTAL: HEP A TOTAL AB: NONREACTIVE

## 2014-05-06 MED ORDER — DARBEPOETIN ALFA-POLYSORBATE 200 MCG/0.4ML IJ SOLN
200.0000 ug | INTRAMUSCULAR | Status: DC
  Administered 2014-05-06: 200 ug via SUBCUTANEOUS

## 2014-05-06 MED ORDER — DARBEPOETIN ALFA-POLYSORBATE 200 MCG/0.4ML IJ SOLN
INTRAMUSCULAR | Status: AC
  Filled 2014-05-06: qty 0.4

## 2014-05-08 LAB — URINE CULTURE

## 2014-05-09 ENCOUNTER — Telehealth: Payer: Self-pay | Admitting: Family Medicine

## 2014-05-09 LAB — POCT HEMOGLOBIN-HEMACUE: Hemoglobin: 9 g/dL — ABNORMAL LOW (ref 12.0–15.0)

## 2014-05-09 NOTE — Telephone Encounter (Signed)
Pt called to get results of lab work done on friday

## 2014-05-09 NOTE — Telephone Encounter (Signed)
Spoke with patient.

## 2014-05-10 ENCOUNTER — Ambulatory Visit (INDEPENDENT_AMBULATORY_CARE_PROVIDER_SITE_OTHER): Payer: Medicare Other | Admitting: Pulmonary Disease

## 2014-05-10 ENCOUNTER — Encounter: Payer: Self-pay | Admitting: Pulmonary Disease

## 2014-05-10 VITALS — BP 116/68 | HR 55 | Temp 99.0°F | Ht 64.0 in | Wt 219.0 lb

## 2014-05-10 DIAGNOSIS — I2510 Atherosclerotic heart disease of native coronary artery without angina pectoris: Secondary | ICD-10-CM

## 2014-05-10 DIAGNOSIS — I10 Essential (primary) hypertension: Secondary | ICD-10-CM

## 2014-05-10 DIAGNOSIS — R0902 Hypoxemia: Secondary | ICD-10-CM

## 2014-05-10 DIAGNOSIS — D500 Iron deficiency anemia secondary to blood loss (chronic): Secondary | ICD-10-CM

## 2014-05-10 DIAGNOSIS — J9610 Chronic respiratory failure, unspecified whether with hypoxia or hypercapnia: Secondary | ICD-10-CM

## 2014-05-10 DIAGNOSIS — K5520 Angiodysplasia of colon without hemorrhage: Secondary | ICD-10-CM

## 2014-05-10 DIAGNOSIS — R296 Repeated falls: Secondary | ICD-10-CM

## 2014-05-10 DIAGNOSIS — K31819 Angiodysplasia of stomach and duodenum without bleeding: Secondary | ICD-10-CM

## 2014-05-10 DIAGNOSIS — I359 Nonrheumatic aortic valve disorder, unspecified: Secondary | ICD-10-CM

## 2014-05-10 DIAGNOSIS — I509 Heart failure, unspecified: Secondary | ICD-10-CM

## 2014-05-10 DIAGNOSIS — R5381 Other malaise: Secondary | ICD-10-CM

## 2014-05-10 DIAGNOSIS — I5032 Chronic diastolic (congestive) heart failure: Secondary | ICD-10-CM

## 2014-05-10 DIAGNOSIS — J4489 Other specified chronic obstructive pulmonary disease: Secondary | ICD-10-CM

## 2014-05-10 DIAGNOSIS — K7460 Unspecified cirrhosis of liver: Secondary | ICD-10-CM

## 2014-05-10 DIAGNOSIS — E669 Obesity, unspecified: Secondary | ICD-10-CM

## 2014-05-10 DIAGNOSIS — J9611 Chronic respiratory failure with hypoxia: Secondary | ICD-10-CM

## 2014-05-10 DIAGNOSIS — K754 Autoimmune hepatitis: Secondary | ICD-10-CM

## 2014-05-10 DIAGNOSIS — J449 Chronic obstructive pulmonary disease, unspecified: Secondary | ICD-10-CM

## 2014-05-10 DIAGNOSIS — Z9181 History of falling: Secondary | ICD-10-CM

## 2014-05-10 NOTE — Progress Notes (Signed)
Subjective:    Patient ID: [name], female    DOB: 1940-10-28, 73 y.o.   MRN: [id number]  HPI 73 y/o WF here for a follow up visit... she has mult med problems as noted below>   ~  September 01, 2012:  15moROV & [name] is c/o a URI "cold in my chest" w/ cough, discolored phlegm "diff colors at diff times", chest congestion, etc; she notes- tired all the time, HH is bothering her, BS at home betw 170-200 range... We reviewed the following medical problems during today's office visit>>     COPD> on Home O2, Advair250, NEBS, Mucinex; remote smoker quit yrs ago; known obstructive & restrictive lung dis; she had ZPak recently but persists w/ c/o discolored phlegm, no f/c/s, vague chest discomfort; last CXR 3/13 showed linear scarring left mid lung, otherw clear; CTA 3/13 was neg for PE & showed some atelec, coronary calcif, multilevel spondylosis, & cirrhosis of the liver; she declined f/u film today- we decided to treat w/ Levaquin, Mucinex, Fluids, Hycodan...    HBP> on Aten50, Lasix40, K20; BP= 118/82 & she has mult somatic complaints but no specific CP, palpit, ch in SOB or edema; continue low sodium & wt reduction.     CAD & mild AS/AI> known non-obstructive CAD; hx atypCP/ CWP; no angina & she remains way too sedentary; discussed risk factor reduction strategy & she saw DrCooper 7/13> stable, no angina, too sedentary; f/u 2DEcho w/ mild LVH, norm LVF w/ EF=55-60% & norm wall motion, mils AS & AI, Gr2DD...    Chol> on Simva40; not really on diet & we discussed this; last FLP 5/13 showed TChol 94, TG 130, HDL 32, LDL 36; continue same, needs exercise!    DM> on Lantus25, Metform1000Bid, Glimep2, Januvia100; needs much better diet & exercise program! Not checking BS at home; labs from 10/13 showed BS=164, A1c=5.0 so we decreased the Glimep from 490mto 110m31mach AM...    Obesity> way too sedentary, ?on diet & her wt is up to 209# today; we discussed diet, exercise, wt reduction strategies...    <<  Chronic GI problems followed by DrDBrodie >>    {GERD, Angiodysplasia w/ chronic GI blood loss >> see below... 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 We will continue to check labs every 110mo85moIron infusions of Hg is reduced & ROV in 48mo.38mo  DJD/ FM/ ?osteopenia/ VitD defic> she has mult somatic complaints & takes Tylenol prn; on VitD 50K weekly...    RLS> on Requip4Qhs & Sinemet25/100Bid from DrDohmeier w/ fair control of symptoms- her note is reviewed...    Anxiety/ Depression/ Psyche> on RX per Psychiatry DrHejazi (we don't have notes from him) & she does not know her meds, didn't bring med bottles or med list!  As best we can tell she is on Nuvigil, Pristiq, Wellbutrin, Risperdal, Seroquel, Xanax> all from Psychiatry & she is reminded to bring all bottles to every visit... We reviewed prob list, meds, xrays and labs> see below for updates >>  LABS 1/14:  CBC- Hg=7.0 (down 4pts) Hct=21.9 Fe=51 (11%sat) B12=623, Protime=11.6/ 1.1, AFP=pending; Abd sonar per DrDBrodie- pending...  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  DrDBrodie asked that we recheck her from the Gerty standpoint due to her SOB>>     COPD> on Home  O2, Advair250, NEBS, Mucinex; remote smoker quit yrs ago; known obstructive & restrictive lung dis; last CXR 2/14 showed cardiomeg, no pulm edema, clear lungs, NAD; CTA 3/13 was neg for PE & showed some atelec, coronary calcif, multilevel spondylosis, & cirrhosis of the liver; she saw TP 3/14 w/ incr cough, thick yellow sput, congestion & post nasal drip> took Avelox & Mucinex w/ some improvement;  We decided to evaluate further>>  PFT today showed FVC=2.41 (83%), FEV1=1.77 (80%), %1sec=74, mid-flows= 61% predicted (all c/w min obstructive dis, +superimposed restriction)...  Ambulatory O2 sats showed 93% sat on RA at rest w/ HR=60; 87% sat after one lap on RA w/ HR=62... Other factors include 12# weight gain, weight =221#, very sedentary lifestyle, anemia (she did not want me to recheck this as it is being followed by DrDBrodie), and severe anxiety...  She thinks a lot of her SOB is from CHF siting her wt gain etc but a recent BNP 3/14 was ok at 180, she has a follow up appt w/ DrCooper soon...  I told her that in my opinion the biggest current issue (and most improvable factor) is anxiety & "chest wall muscle spasm" making her tight & hard to get a good breath "IN" as she described it; I went through my long explanation for this problems & told her that the BEST therapy for this was a "combination relaxer" like KLONOPIN & rec that she try 63m- 1/2 to 1 tab bid... All questions answered & she is agreeable to this plan... ADDENDUM >> pt called back & indicated that after reading the Pharm handout that she decided NOT to take the Klonopin, doesn't want a substitute...  ~  February 25, 2013:  263moOV & post hosp check> since I last saw [name]- she has been Adm 3 times by [facility]>>   Adm 5/25-27 for weakness assoc w/ IDA & GIB> Hx cirrhosis, angiodysplasia, and had EGD showing sm varicies, portal gastropathy, mult AVMs w/ laser ablation; Hg was 7.7=> 2uPCs & improved to 9 range; subseq capsule endoscopy showed  AVMs in upper sm intestines as well... Adm 6/9-12 for N&V assoc w/ constip & UTI (sens EColi- treated w/ Cipro) and underlying cirrhosis, portal gastropathy, angiodysplasia, etc (treated w/ PPI & monitoring of Hg & Fe- given 2uPRBCs & Fe infusions as outpt)...  Adm 02/19/81-42r DiastolicCHF (CXR- vasc congestion; 2DEcho- EF=55-60%, Gr2DD, mildAS/AI; Lasix added)...    COPD> on Home O2, Advair250, NEBS, Mucinex; remote smoker quit yrs ago; known obstructive & restrictive lung dis; CXR 6/14 showed mild cardiomeg, mild vasc congestion, linear atx at left base, otherw clear; CTA 3/13 was neg for PE & showed some atelec, coronary calcif, multilevel spondylosis, & cirrhosis of  the liver; she continues on O2 by [place] at 3L/min...     HBP> on Aten50, Lisinopril5, Lasix20-3/d, K10Qod; BP= 130/70 & she has mult somatic complaints but no specific CP, palpit, ch in SOB or edema; continue low sodium & wt reduction.     CAD, DiastolicCHF, & mild AS/AI> known non-obstructive CAD; hx atypCP/ CWP; no angina & she remains way too sedentary; discussed risk factor reduction strategy & she saw DrCooper 5/14> stable, no angina, too sedentary; last 2DEcho 7/13 w/ mild LVH, norm LVF w/ EF=55-60% & norm wall motion, mild AS&AI, Gr2DD;  EKG 5/14 w/ NSR, rate62, 1st degree AVB, NSSTTWA...    Chol> on Simva40; not really on diet & we discussed this; last FLP 5/13 showed TChol 94, TG 130, HDL 32, LDL 36; continue same, needs exercise!    DM> on Lantus20u & off all DM pills per DrEllison; needs much better diet & exercise program! Not checking BS at home; labs from 6/14 showed BS=169, A1c=6.7 & she will continue f/u w/ Endocrinology...    Obesity> way too sedentary, ?on diet & her wt is 218# today; we discussed diet, exercise, wt reduction strategies...    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    DJD/ FM/ ?osteopenia/ VitD defic> she has mult somatic complaints & takes Tylenol prn; on MVI + VitD 50K weekly...    RLS> on Requip4Qhs & Sinemet25/100Bid from DrDohmeier w/ fair control of symptoms- her note is reviewed...    Anxiety/ Depression/ PsycheHx> on RX per Psychiatry DrHejazi (we don't have notes from him) & she does not know her meds, didn't bring med bottles or med list!  As best we can tell she is on Nuvigil (on hold), Pristiq100, WellbutrinXL150, Seroquel100-4Qhs, Xanax56m prn- all from Psychiatry & she is reminded to bring all bottles to every visit... We reviewed prob list, meds, xrays and labs> see below for updates >>  LABS 7/14:  CBC- Hg=8.8, MCV=93, WBC=3.2, Plat=74K;  Fe=58 (14%sat), Ferritin=12.5..Marland KitchenMarland KitchenNOTE>> Fe is dropping & we decided to infuse FERAHEME [id number] as outpt... ROV 57m9mo~  March 29, 2013:  57mo43mo & [name] received her IV Feraheme 510mg48msince our last visit; still c/o no energy & SOB; she went to the ER 1wk ago w/ UTI- given Cipro but cult returned EColi resist Cipro & switched to Keflex (sens);  Due for f/u labs today>> We reviewed prob list, meds, xrays and labs> see below for updates >>  CXR 7/14 showed norm heart size, clear lungs, no edema etc... LABS 8/14:  Chems- ok x BS=257 Cr=1.4;  CBC- Hg=8.8 Fe=138 (35%sat)...  Rec>> needs f/u appt w/ DrEllison for endocrine & plan f/u labs monthly...  ~  May 31, 2013:  65mo R53mo since I last saw [name], Hosp once, seen by Cards x3, GI x2, and DrEllison x2> complex story & a lot to get caught up on...    [facility] 05/27/13 by [facility] w/ incr SOB & DOE, found to have Hg=7.7 w/  pos stools, had some nausea but no abd pain; given one unit, given IV Iron; &  she had EGD by DrPyrtle- no varices, smHH, mild gastropathy, area of GAVE treated w/ APC, nodular mucosa (inflammed mucosa, neg HPylori, no dysplasia); Rec rx w/ PPI Bid & Carafate Qid, Aten changed to Corgard for better reduction in portal pressure...  CARDS>  BP stable at 128/68; she had f/u Cards- DrCooper 9/14> nonobstructive CAD, HBP, mildAS, diastolicCHF, HL, morbid obesity; Lasix had been added & adjusted, she is NYHA III, otherw no changes... DM>  Unfortunately her wt is up 14# to 229# today; followed by DrEllison & seen 9/14> DM, neuropathy, CAD; A1c interpret influenced by transfusions; they increased her Lantus to 200u daily...  GI>  Followed by DrDBrodie & DrPyrtle- EGD as above, she had EGD w/ APC for her GAVE... ANEMIA> she received Tx 1u and IV Iron; Hg stabilized ~9...  ~  July 01, 2013:  72moROV but in the interval MLakhiahas had one ER visit (c/o SOB but had "a myriad of complaints"; no acute abn on her w/u w/ CXR, EKG, Labs; disch on same meds)... She saw DrEllison for DM f/u- her sugars ranged 90-400 & A1c=8.0; he changed her insulin dosing to an evening shot... She saw DrDBrodie for GI> hx advanced liver dis from autoimmune hepatitis, ?steatohepatitis, portal hypertension & gastropathy w/ GI blood loss, ?GAVE w/ prev ablation of angodysplasia; they followed up on her lab work & plan to check monthly CBC, Fe... She saw Urology, DrGrapey 10/14 & they noted some mild voiding symptoms and hx recurrent cystitis w/ "a completely pos ROS"; they rec treating UTI only id symptomatic w/ a urinary pathogen...     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 She is also c/o increased cough, congestion & yellow sput x2wks, says she fell & hit right side w/ some right rib  soreness; Recently treated w/ Levaquin but worried that she has pneumonia (CXR today is clear, ?atx in RML, and rib films w/ ?crack in right 7th rib?)...  We reviewed prob list, meds, xrays and labs> extensive med list reviewed w/ pt but she did not bring her med bottles...  ~  October 13, 2013:  366moOV & [name] has been followed by DrDBrodie & was re-hosp 2/8 - 10/04/13 by [facility] w/ symptomatic anemia, iron deficiency, & her mult chronic medical issues; she was transfused & given IV iron and she has had f/u OV by GI- DrDBrodie who has ordered f/u blood counts and iron levels under her direction; [facility]... [facility] "we talk about my problems, she does blood work, and she communicates w/ DrBrodie"...  Pt has also had f/u visits w/ Sc[facility]son Dopp[facility], and DrEllison for DM/ Endocrine...     COPD> on Home O2, Advair250, NEBS, Mucinex; remote smoker quit yrs ago; known obstructive & restrictive lung dis; CXR 2/15 showed mild cardiomeg, mild vasc congestion, left mid lung scarring & peribronch thickening, otherw clear/NAD; CTA 3/13 was neg for PE & showed some atelec, coronary calcif, multilevel spondylosis, & cirrhosis of the liver; she continues on O2 by [place] at 3L/min...Marland KitchenMarland Kitchen  HBP> on Corgard20-1/2, Lisinopril5, Lasix20-2Bid, K10Qod; BP= 122/68 & she has mult somatic complaints but no specific CP, palpit, ch in SOB or edema; continue low sodium & wt reduction.     CAD, DiastolicCHF, & mild AS/AI> known non-obstructive CAD; hx atypCP/ CWP; no angina & she remains way too sedentary; discussed risk factor reduction strategy & she saw ScottWeaver 12/14> fragile status, chr dyspnea, no angina, too sedentary; last 2DEcho 7/13 w/ mild LVH, norm LVF w/ EF=55-60% & norm wall motion, mild AS&AI, Gr2DD;  EKG 12/14 w/ SBrady, rate47, 1st degree AVB, NSSTTWA; Corgard decr from 20=>51m/d...    Chol> on Simva40; not  really on diet & we discussed this; last FLP 5/13 showed TChol 94, TG 130, HDL 32, LDL 36; continue same, needs exercise, & ret for FLP f/u...    DM> on Lantus&Humalog per DrEllison; needs much better diet & exercise program! Not checking BS at home; labs from [phone] EFort Depositshows BS=130-260 and last A1c was 1/15= 6.5    Obesity> way too sedentary, ?on diet & her wt is 230# today; we discussed diet, exercise, wt reduction strategies...    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    DJD/ FM/ ?osteopenia/ VitD defic> she has mult somatic complaints & takes Tylenol prn; on MVI + VitD 50K weekly...    RLS> on Requip4Qhs & off Sinemet from DrDohmeier w/ fair control of symptoms- prev neuro note is reviewed...    Anxiety/ Depression/ PsycheHx> on RX per Psychiatry DrHejazi (we don't have notes from him) & she does not know her meds, didn't bring med bottles or med list!  As best we can tell she is on Pristiq100, WellbutrinXL150, Seroquel100-4Qhs, Xanax138mprn- all from Psychiatry & she is reminded to bring all bottles to every visit... We reviewed prob list, meds, xrays and labs> see below for updates >> I will continue to eval & treat her COPD, chr resp issues as I transition out of my primary care roll...   ~  Jan 10, 2014:  62m60moV & [name] is now [facility] to [facility] DrCooper for Cards, DrDBrodie for GI> their notes are reviewed...    Here for a 62mo35mo Pulmonary follow up- Combined obstructive & restrictive lung disease> on Home O2, Advair250, NEBS, Mucinex; remote smoker quit yrs  ago; known obstructive & restrictive lung dis by PFTs; CXR 2/15 showed mild cardiomeg, mild vasc congestion, left mid lung scarring & peribronch thickening, otherw clear/NAD; CTA 3/13 was neg for PE & showed some atelec, coronary calcif, multilevel spondylosis, & cirrhosis of the liver; she continues on O2 by [place] at 3L/min...     Breathing has been stable and chest sounds good;  She has not been taking the Mucinex regularly 7 is advised 2Bid w/ fluids;  She desperately needs to lose weight but is unable to exercise she says, noted DOE w/ ADLs but has not been exercising- rec to do chair exercises, the advance to walking, stairs in a progressive fashion...    She continues to be monitored regularly by  PrimaryCare w/ CBCs, periodic transfusions, regular Procrit injections, etc...   ~  May 10, 2014:  20moROV & [name] was Adm 7/31 - 03/31/14 by [facility] w/ UTI, hi fever, encephalopathy, fall w/ sternal fx & 2 right rib fxs, superimposed on her chronic issues of COPD, chr diastolic CHF, cirrhosis, GIB/ AVMs, anemia, etc;  She recovered w/ antibiotics, fluids, supportive care and went for NHP/rehab where she was managed by SRobinson  Since disch she has seen DrGuttierez for Primary Care & his notes are reviewed...     From the pulmonary standpoint> MAerilynremains on O2 at 3L/min by [place] (O2sat=99%), Advair250Bid, Mucinex 600Bid, & ProventilHFA rescue inhaler as needed (ave 1-2 daily);  She reports that her breathing is about at baseline;  Exam shows decr BS bilat w/o wheezing/ rales/ signs of consolidation;  She is rec to continue same meds and concentrate on deep breaths to keep lungs well expanded (Incentive spirometry);  She is doing home PT & slowly improved...     She continues to follow up w/ Cards for her non-obstructive CAD, mild AS, diastolic CHF, obesity (wt is actually down 15# to 219# today);  Med list includes: Corgard20, Lasix40Bid, Aldactone25-1/2 tab/d; BP= 116/68 & she is still very sore...     She is followed for GI by DrDBrodie; cirrhosis likely from steatohepatis, portal HTN, gastropathy & AVMs s/p ablation; meds include: Xifaxan, Lactulose, Aldactone...    DrGuttierez is monitoring her blood counts (Hg was down to 7.5=>9.3), renal function (Cr was up to 3 & is now back to norm=0.9), etc & provides PC transfusions when needed & Aranesp Q2wks...  We reviewed prob list, meds, xrays and labs> see below for updates >>   CXR 7/28 - 03/28/14 showed mid sternal fx, right lat rib fx, right perihilar opac- ?consolidation, mild interstitial edema, cardiomegaly, prob pulm arterial HTN...   CT Chest 03/25/14 showed sternal fx w/ sm hematoma, acute fx of right lat 9th rib, plus additional old rib fxs, patchy atx bilat, cirrhotic liver & enlarged spleen  2DEcho 8/15 showed norm LV size & function w/ EF=55-60%, no regional wall motion abn, mildly thickened AV leaflets, mild AS & Ai, calcif of mitral annulus, mod LA dil...           Problem List:  COPD (ICD-496) & BRONCHITIS, RECURRENT (ICD-491.9) - Ex-smoker w/ underlying COPD on home O2, ADVAIR 250Bid, ALBUTEROL (via nebs or MDI for Prn use), & MUCINEX 1-2Bid... she has combined obstructive AND restrictive disease (based on her obesity) w/ diet (weight reduction) + exercise strongly recommended to the pt...  ~  CTChest 5/06 = neg. ~  baseline CXR w/ bilat LL scarring, NAD.... last CXR 3/09 = unchanged, NAD. ~  PFTs 3/09 showed FVC= 2.47 (83%), FEV1= 1.65 (69%), %1sec= 67, mid-flows= 28%pred. ~  CXR 4/12 showed bilat scarring, NAD... ~  PFT 10/12 showed FVC= 2.28 (77%), FEV1= 1.46 (64%), %1sec= 64, mid-flows= 31% pred. ~  CXR 3/13 showed normal heart size, prom hilar regions, no infiltrates etc, NAD..[name] ~  CTAngio Chest 3/13 showed neg for PE- scarring at lung bases, no adenopathy or lung lesions, calcified coronaries, multilevel spondylosis in TSpine, cirrhosis in liver ~  CXR 2/14 showed cardiomeg, no pulm edema, clear lungs, NAD ~  PFT 5/14  showed FVC=2.41 (83%), FEV1=1.77 (80%), %1sec=74, mid-flows= 61% predicted (all c/w min obstructive dis, +superimposed restriction) ~  Ambulatory O2 sats 5/14 showed 93% sat on RA at rest w/ HR=60; 87% sat after one lap  on RA w/ HR=62 ~  7/14: on Home O2, Advair250, NEBS, Mucinex; remote smoker quit yrs ago; known obstructive & restrictive lung dis; CXR 6/14 showed mild cardiomeg, mild vasc congestion, linear atx at left base, otherw clear; CTA 3/13 was neg for PE & showed some atelec, coronary calcif, multilevel spondylosis, & cirrhosis of the liver; she continues on O2 by [place] at 3L/min...  ~  CXR 7/14 & 9/14 showed norm heart size, clear lungs, no edema etc..  ~  2/15: on Home O2, Advair250, NEBS, Mucinex; remote smoker quit yrs ago; known obstructive & restrictive lung dis; CXR 2/15 showed mild cardiomeg, mild vasc congestion, left mid lung scarring & peribronch thickening, otherw clear/NAD; CTA 3/13 was neg for PE & showed some atelec, coronary calcif, multilevel spondylosis, & cirrhosis of the liver; she continues on O2 by [place] at 3L/min...  ~  CXR 4/15 & 5/15 by Cards queried a nodular appearance to right hilum but f/u film showed this was wnl; scarring & atx at bases, healing right 7th rib fx...  HYPERTENSION (ICD-401.9) >> on ATENOLOL 4m/d,  LASIX 420md & K20/d...   CAD (ICD-414.00) >> on Corgard20-1/2, Lisinopril5, Lasix20-2Bid, K10Qod ~  cath 4/09 by DrCooper showed normLMAIN, 20% midLAD, norm CIRC, 20-30% proxRCA, EF= 50-55%... ~  CTAngio Chest 3/13 showed calcif coronaries as noted... ~  DrCooper 7/13> f/u 2DEcho w/ mild LVH, norm LVF w/ EF=55-60% & norm wall motion, mils AS & AI, Gr2DD...Marland KitchenMarland KitchenPALPITATIONS (ICMBW-466.5>>  DIASTOLIC CHF >>  ~  eval by DrKlein w/ 7 beats WCT on monitor... he changed Lisinopril to BBlocker (currently ATENOLOL 5045m) and improved... ~  she saw DrCopper for Cards f/u 1/11- palpit well controlled on Atenolol; hx non-obstructive CAD & good LVF; no change in  Rx. ~  [facility]- Disch on Lasix20-3/d  AORTIC STENOSIS (ICD-424.1) - she has mild Ao Valve disease w/ AS/ AI... followed by DrCooper. ~  2DEcho 11/06 showed mild ca++ AoV w/ mild AS, norm LVF w/ EF=55-65% ~  repeat 2DEcho 1/09 showed similar mild ca++ AoV w/ mild AS, mild AI, no regional wall motion abn, norm LVf w/ EF= 60%... NOTE: est PAsys= 35... ~  repeat 2DEcho 1/10 showed norm LVF w/ EF= 55-60%, no regional wall motion abn, mild DD, mod calcif AoV & mild reduced leaflet excursion & mild AI... ~  Repeat 2DEcho 7/13 showed norm LVF w/ EF=55-65%, no regional wall motion abn, Gr2DD, mildAS, mildAI, mild LAdil... ~  DrCooper 7/13> stable, no angina, too sedentary; f/u 2DEcho w/ mild LVH, norm LVF w/ EF=55-60% & norm wall motion, mils AS & AI, Gr2DD... ~  7/14: known non-obstructive CAD; hx atypCP/ CWP; no angina & she remains way too sedentary; discussed risk factor reduction strategy & she saw DrCooper 5/14> stable, no angina, too sedentary; last 2DEcho 7/13 w/ mild LVH, norm LVF w/ EF=55-60% & norm wall motion, mild AS&AI, Gr2DD;  EKG 5/14 w/ NSR, rate62, 1st degree AVB, NSSTTWA...  HYPERCHOLESTEROLEMIA (ICD-272.0) - on SIMVASTATIN 98m69m..  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  ~  EGD  2/06 showed angiodysplasia & mult gastric pseudopolyps, watermelon stomach improved from 2003. ~  recent f/u EGD 9/09 showed chr gastritis, no watermelon  stomach & improved from prev... ~  recurrent anemia 12/10 w/ f/u EGD 2/11 showing gastric antral avm's- s/p argon laser ablation, no varicies etc... ~  2011: she's been followed closely by GI DrDBrodie for her Autoimmune liver dis  w/ Cirrhosis & splenomegaly, chr GI blood loss due to portal hypertensive gastropathy, angiodysplasia, & colon polyps> off her prev Immuran Rx due to nausea. ~  5/13:  She is not on PPI and c/o some abd discomfort- rec starting PROTONIX 28m/d... ~  She saw DrDBrodie 10/13 w/ c/o dysphagia + f/u of her autoimmune liver dis, portal HTN, cirrhosis, "watermelon stomach" & hx argon laser ablation of gastric AVMs (chr GI blood loss w/ periodic Fe infusions- last 8/13); EGD 10/13 showed norm esoph mucosa, no varicies, spasm at the LES but no stricture found; dilated to 31F; mod severe portal hypertensive gastropathy in stomach, no active bleeding; REC> continue PPI, try Levsin... ~  1/14:  She had EGD & Argon Laser ablation of gastric AVM by DrDBrodie... 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 ~  9/14:  She was [facility] & seen by GI- drPyrtle w/ EGD (no varices, smHH, mild gastropathy, area of GAVE treated w/ APC, nodular mucosa (inflammed mucosa, neg HPylori, no dysplasia)...  DIVERTICULOSIS OF COLON (ICD-562.10) & POLYP, COLON (ICD-211.3) -  ~  last colonoscopy 10/08 w/ adenomatous polyp removed, and angiodysplasia without hemorrhage ~  CT Abd 10/08 showed Cirrhosis, borderline splenomeg, sl pancreatic atrophy & duct dil, sm umbil hernia, DJD sp, NAD... ~  10/13: she had f/u drDBrodie> last colonoscopy was 2008, she felt pt was too high risk for prep & sedation for a f/u colonoscopy & they decided to hold off...  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 ~  10/11:  presents w/ dysuria & UTI Rx w/ Cipro... ~  12/11:  She had f/u DrGrapey... ~  6/14: she was treated for a sens EColi UTI during 6/14 Hosp w/ Cipro...  OSTEOARTHRITIS (ICD-715.90)  FIBROMYALGIA (ICD-729.1) - she persists w/ mult somatic complaints...  ? of OSTEOPOROSIS (ICD-733.00) - [place]  RESTLESS LEGS SYNDROME (ICD-333.94) - she prev weaned off the Requip & remains on SINEMET-CR 25-100Bid... ~  9/10: now c/o recurrent restless leg symptoms... restart REQUIP 244m& titrate up. ~  12/10: persistant symptoms on 57m76ms... rec to incr to 4mg36m ~  2/11: she reports resolution of RLS on 4mg 69muip Qhs, resting well now. ~  12/11:  Recurrent RLS symptoms despite the Requip & seen by DrDohmeier w/ addition of Sinemet 25/100 Bid... ~  2012:  Stable on Requip4mg &71mnemet 25/100 Bid... ~  7/13:  She is once again c/o incr RLS symptoms & rec to restart REQUIP 4mg/d.657m ~  8/13: she had f/u visit w/ DrDohmeier> RLS,  tremors, & insomnia; Seroquel helped for awhile, she is working w/ DrHejazi to help her problems... ~  7/14: on Requip4Qhs & Sinemet25/100Bid from DrDohmeier (RLS, Trmeor, Gait abn) w/ fair control of symptoms- her note is reviewed & they stopped the Sinemet...  DYSTHYMIA (ICD-300.4) - Hx depression w/ psychotic features and hosp 2/09 at [place] w/ delusions of pedunculosis> she was prev treated by Dr. Viann Fish & DrSuttenfield for counselling...  Adm to [facility] in May09 by DrBarbSmith w/ depression and meds adjusted (see DC summary)...  Now followed by Jobe Gibbon and [name] at [employer] [facility]- she was re-admitted 10/09 and meds changed to RISPERDAL, PRISTIQUE, BUPROPION, SEROQUEL, XANAX & Nuvigil (Armodafinil)...  husb indicates that she is  much better at present & continues outpt Rx... ~  MRI/ MRA Brain 2/08 showed sm vessel dis & atherosclerotic changes in post circ... ~  9/10: pt indicates that she is off the Trazodone... DrHejazi added Wellbutrin ?for her RLS? ~  2011:  pt continues regular f/u w/ Psychiatry on numerous meds w/ freq med adjustments; she is requested to get notes from [facility] to our attention & bring all med bottles to her OVs for review... ~  2012:  We have not received any notes from [facility]; pt has not complied w/ our request for med bottles to be brought to each OV... ~  2013:  We still don't have accurate list of her current meds as we have not received [facility] notes & she NEVER brings meds to office despite our requests... ~  7/14: on RX per Psychiatry DrHejazi (we don't have notes from him) & she does not know her meds, didn't bring med bottles or med list!  As best we can tell she is on Nuvigil (on hold), Pristiq100, WellbutrinXL150, Seroquel100-4Qhs, Xanax41m prn- all from Psychiatry & she is reminded to bring all bottles to every visit...   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    Past Surgical History  Procedure Laterality Date  . Hemorroidectomy    . Appendectomy    . Vaginal hysterectomy    . Breast biopsy      bilateral  . Cesarean section      x 3  . Appendectomy    . Esophagogastroduodenoscopy  06/12/2012    Procedure: ESOPHAGOGASTRODUODENOSCOPY (EGD);  Surgeon: DLafayette Dragon MD;  [facility];  Service: Endoscopy;  Laterality: N/A;  . Balloon dilation  06/12/2012    Procedure: BALLOON DILATION;  Surgeon: DLafayette Dragon MD;  Location[facility];  Service: Endoscopy;  Laterality: N/A;  ?balloon  . Esophagogastroduodenoscopy  09/10/2012    Procedure: ESOPHAGOGASTRODUODENOSCOPY  (EGD);  Surgeon: DLafayette Dragon MD;  [facility];  Service: Endoscopy;  Laterality: N/A;  . Hot hemostasis  09/10/2012    Procedure: HOT HEMOSTASIS (ARGON PLASMA COAGULATION/BICAP);  Surgeon: DLafayette Dragon MD;  [facility];  Service: Endoscopy;  Laterality: N/A;  . Esophagogastroduodenoscopy N/A 01/18/2013    Procedure: ESOPHAGOGASTRODUODENOSCOPY (EGD);  Surgeon: DLafayette Dragon MD;  [facility];  Service: Endoscopy;  Laterality: N/A;  . Esophagogastroduodenoscopy N/A 05/24/2013    Procedure: ESOPHAGOGASTRODUODENOSCOPY (EGD);  Surgeon: JJerene Bears MD;  [facility]  Service: Gastroenterology;  Laterality: N/A;  . Hot hemostasis N/A 05/24/2013    Procedure: HOT HEMOSTASIS (ARGON PLASMA COAGULATION/BICAP);  Surgeon: JJerene Bears MD;  Location[facility];  Service: Gastroenterology;  Laterality: N/A;    Outpatient Encounter Prescriptions as of 05/10/2014  Medication Sig  . albuterol (PROAIR HFA) 108 (90 BASE) MCG/ACT inhaler Inhale 2 puffs into the lungs every 6 (six) hours as needed for wheezing or shortness of breath.   . ALPRAZolam (XANAX) 1 MG tablet Take 1 mg by mouth 3 (three) times daily as needed for anxiety.  [name] b complex vitamins tablet Take 1 tablet by mouth daily.   [name] buPROPion (WELLBUTRIN XL) 150 MG 24 hr tablet Take 150 mg by mouth daily.  [name] desvenlafaxine (PRISTIQ) 100 MG 24 hr tablet Take 100 mg by mouth daily. Takes with a 69m tablet, total daily dose is 1545m . desvenlafaxine (PRISTIQ) 50 MG 24 hr tablet Take 50 mg by mouth daily. Takes with a 10011mablet, total daily dose is 150m37m Fluticasone-Salmeterol (ADVAIR) 250-50 MCG/DOSE AEPB Inhale 1 puff into the lungs 2 (two) times daily.  . furosemide (LASIX) 40 MG tablet Take 1 1/2 tablets (60 mg) in the morning and 1 tablet in the afternoon  . guaiFENesin (MUCINEX) 600 MG 12 hr tablet Take 600 mg by mouth 2 (two) times daily. 2 tablet by mouth once daily  . insulin glargine (LANTUS)  100 UNIT/ML injection Inject 0.8 mLs (80 Units total) into the skin at bedtime.  . insulin lispro (HUMALOG) 100 UNIT/ML KiwkPen Inject 15-30 Units into the skin daily with supper. SSI  . lactulose, encephalopathy, (CHRONULAC) 10 GM/15ML SOLN Take 20 g by mouth daily. constipation  . Multiple Vitamins-Minerals (CENTRUM SILVER PO) Take 1 tablet by mouth daily.   . nadolol (CORGARD) 20 MG tablet Take 10 mg by mouth daily.  . oxMarland KitchenCODONE (OXY IR/ROXICODONE) 5 MG immediate release tablet Take one tablet by mouth every 4 hours as needed for pain  . pantoprazole (PROTONIX) 40 MG tablet Take 40 mg by mouth 2 (two) times daily.   . promethazine (PHENERGAN) 25 MG tablet Take 1 tablet (25 mg total) by mouth 2 (two) times daily as needed for nausea.  . QUMarland Kitchentiapine (SEROQUEL) 400 MG tablet Take 400 mg by mouth at bedtime.  . rifaximin (XIFAXAN) 550 MG TABS tablet Take 1 tablet (550 mg total) by mouth daily.  . rOMarland KitchenINIRole (REQUIP) 4 MG tablet Take 4 mg by mouth at bedtime.  . Saline (OCEAN NASAL SPRAY NA) Place 1 spray into the nose 3 (three) times daily as needed (congestion).   . simvastatin (ZOCOR) 40 MG tablet Take 40 mg by mouth daily.  . spMarland Kitchenronolactone (ALDACTONE) 12.5 mg TABS tablet Take 0.5 tablets (12.5 mg total) by mouth daily.  . Vitamin D, Ergocalciferol, (DRISDOL) 50000 UNITS CAPS capsule Take 50,000 Units by mouth every 7 (seven) days. On Fridays  . [DISCONTINUED] furosemide (LASIX) 40 MG tablet Take 1 tablet (40 mg total) by mouth 2 (two) times daily. Take 1 1/2 tablets (60 mg) in the morning and 1 tablet in the afternoon    Allergies  Allergen Reactions  . Aspirin Other (See Comments)    Causes nosebleeds  . Penicillins Itching  . Theophylline Nausea And Vomiting    Current Medications, Allergies, Past Medical History, Past Surgical History, Family History, and Social History were reviewed in [facility].    Review of Systems         See HPI - all other  systems neg except as noted...      The patient complains of weight gain, dyspnea on exertion, muscle weakness,  difficulty walking, and depression.  The patient denies anorexia, fever, weight loss, vision loss, decreased hearing, hoarseness, chest pain, syncope, peripheral edema, prolonged cough, headaches, hemoptysis, abdominal pain, melena, hematochezia, severe indigestion/heartburn, hematuria, incontinence, suspicious skin lesions, transient blindness, unusual weight change, abnormal bleeding, enlarged lymph nodes, and angioedema.     Objective:   Physical Exam     WD, Obese, 73 y/o WF in NAD... she is chr ill appearing... GENERAL:  Alert & oriented x 3... HEENT:  [place]/AT, pale conjuct, EOM-wnl, PERRLA, EACs-clear, TMs-wnl, NOSE-clear, THROAT-clear w/ dry MMs... NECK:  Supple w/ fairROM; no JVD; normal carotid impulses w/o bruits; no thyromegaly or nodules palpated; no lymphadenopathy. CHEST:  Clear to P & A; without wheezes/ rales/ or rhonchi heard... HEART:  Regular Rhythm;  gr 1/6 SEM without rubs or gallops... ABDOMEN:  Obese, soft & nontender w/ panniculus; normal bowel sounds; no organomegaly or masses detected. EXT: without deformities, mod arthritic changes; no varicose veins/ +venous insuffic/ tr edema. NEURO:  CN's intact;  no focal neuro deficits... DERM:  No lesions noted; no rash, pale complexion.  RADIOLOGY DATA:  Reviewed in the EPIC EMR & discussed w/ the patient...  LABORATORY DATA:  Reviewed in the EPIC EMR & discussed w/ the patient...   Assessment & Plan:    DYSPNEA> clearly this is multifactorial w/ components from Lung dis, CHF, Obesity, inactivity, Anemia, etc; a large component is likely related to anxiety & chest "tightness" for which she has Xanax 51m from her psychiatrists...  COPD>  Combined mild obstructive & mod restrictive lung dis; PFT showed sl deterioration in FEV1; multifactorial dyspnea including stress; REC> continue Advair, NEBS, O2, Mucinex, etc;  get on diet/ exercise/ get weight down; continue [facility] f/u for help w/ anxiety...  HBP>  Controlled on low dose BBlocker plus diuretic> continue same & work on weight reduction...  CAD, DiastolicCHF>  Known nonobstructive dis, way too sedentary> we discussed diet + exercise...  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  CHOL>  Stable on the Simva40...  DM>  Managed by DrEllison on Lantus insulin currently...  OBESITY>  Reviewed diet + exercise needed...  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  Autoimmune hepatitis>  Also managed by DrBrodie, off the Immuran at present... She also has pancytopenia related to her cirrhosis.  RLS>  Notes from DrDohmeier reviewed> pt is off Sinemet now...  Dysthymia>  followed by [facility] on mult meds...  Anemia>  [facility] DrDBrodie for GI...   Patient's Medications  New Prescriptions   No medications on file  Previous Medications   ALBUTEROL (PROAIR HFA) 108 (90 BASE) MCG/ACT INHALER    Inhale 2 puffs into the lungs every 6 (six) hours as needed for wheezing or shortness of breath.    ALPRAZOLAM (XANAX) 1 MG TABLET    Take 1 mg by mouth 3 (three) times daily as needed for anxiety.   B COMPLEX VITAMINS TABLET    Take 1 tablet by mouth daily.    BUPROPION (WELLBUTRIN XL) 150 MG 24 HR TABLET    Take 150 mg by mouth daily.   DESVENLAFAXINE (PRISTIQ) 100 MG 24 HR TABLET    Take 100 mg by mouth daily. Takes with a 558mtablet, total daily dose is 150108m DESVENLAFAXINE (PRISTIQ) 50 MG 24 HR TABLET    Take 50 mg by mouth daily.  Takes with a 173m tablet, total daily dose is 1571m  FLUTICASONE-SALMETEROL (ADVAIR) 250-50 MCG/DOSE AEPB    Inhale 1 puff into the lungs 2 (two) times daily.   GUAIFENESIN (MUCINEX) 600 MG 12 HR TABLET    Take 600 mg by mouth 2 (two) times daily. 2 tablet by mouth once daily    INSULIN GLARGINE (LANTUS) 100 UNIT/ML INJECTION    Inject 0.8 mLs (80 Units total) into the skin at bedtime.   INSULIN LISPRO (HUMALOG) 100 UNIT/ML KIWKPEN    Inject 15-30 Units into the skin daily with supper. SSI   LACTULOSE, ENCEPHALOPATHY, (CHRONULAC) 10 GM/15ML SOLN    Take 20 g by mouth daily. constipation   MULTIPLE VITAMINS-MINERALS (CENTRUM SILVER PO)    Take 1 tablet by mouth daily.    NADOLOL (CORGARD) 20 MG TABLET    Take 10 mg by mouth daily.   OXYCODONE (OXY IR/ROXICODONE) 5 MG IMMEDIATE RELEASE TABLET    Take one tablet by mouth every 4 hours as needed for pain   PANTOPRAZOLE (PROTONIX) 40 MG TABLET    Take 40 mg by mouth 2 (two) times daily.    PROMETHAZINE (PHENERGAN) 25 MG TABLET    Take 1 tablet (25 mg total) by mouth 2 (two) times daily as needed for nausea.   QUETIAPINE (SEROQUEL) 400 MG TABLET    Take 400 mg by mouth at bedtime.   RIFAXIMIN (XIFAXAN) 550 MG TABS TABLET    Take 1 tablet (550 mg total) by mouth daily.   ROPINIROLE (REQUIP) 4 MG TABLET    Take 4 mg by mouth at bedtime.   SALINE (OCEAN NASAL SPRAY NA)    Place 1 spray into the nose 3 (three) times daily as needed (congestion).    SIMVASTATIN (ZOCOR) 40 MG TABLET    Take 40 mg by mouth daily.   SPIRONOLACTONE (ALDACTONE) 12.5 MG TABS TABLET    Take 0.5 tablets (12.5 mg total) by mouth daily.   VITAMIN D, ERGOCALCIFEROL, (DRISDOL) 50000 UNITS CAPS CAPSULE    Take 50,000 Units by mouth every 7 (seven) days. On Fridays  Modified Medications   Modified Medication Previous Medication   FUROSEMIDE (LASIX) 40 MG TABLET furosemide (LASIX) 40 MG tablet      Take 1 1/2 tablets (60 mg) in the morning and 1 tablet in the afternoon    Take 1 tablet (40 mg total) by mouth 2 (two) times daily. Take 1 1/2 tablets (60 mg) in the morning and 1 tablet in the afternoon  Discontinued Medications   No medications on file

## 2014-05-10 NOTE — Patient Instructions (Signed)
Today we updated your med list in our EPIC system...    Continue your current medications the same...  You may use a heating pad for your chest wall pain...  Continue the Advair twice daily...    And ok to use the AlbuterolHFA rescue inhaler as needed...   Remember the Mucinex 1200mg  twice daily w/ plenty of fluids...  Concentrate on good deep breaths in through the nose & out against pursed lips...  Call for any questions...  Let's plan a follow up visit in 65mo, sooner if needed for problems.Marland KitchenMarland Kitchen

## 2014-05-13 ENCOUNTER — Other Ambulatory Visit: Payer: Self-pay | Admitting: Family Medicine

## 2014-05-13 ENCOUNTER — Other Ambulatory Visit: Payer: Self-pay | Admitting: Pulmonary Disease

## 2014-05-13 NOTE — Telephone Encounter (Signed)
Plz phone in

## 2014-05-16 ENCOUNTER — Other Ambulatory Visit: Payer: Self-pay | Admitting: Family Medicine

## 2014-05-16 NOTE — Telephone Encounter (Signed)
Pt requesting medication refill. Last f/u appt 04/2014 with upcoming 05/2014 appt. Is pt requiring repeat bloodwork, or ok to fill as is? pls advise

## 2014-05-16 NOTE — Telephone Encounter (Signed)
Phoned in to pharmacy. 

## 2014-05-17 ENCOUNTER — Ambulatory Visit (INDEPENDENT_AMBULATORY_CARE_PROVIDER_SITE_OTHER): Payer: Medicare Other | Admitting: Family Medicine

## 2014-05-17 ENCOUNTER — Other Ambulatory Visit: Payer: Self-pay | Admitting: Cardiovascular Disease

## 2014-05-17 ENCOUNTER — Encounter: Payer: Self-pay | Admitting: Family Medicine

## 2014-05-17 ENCOUNTER — Other Ambulatory Visit: Payer: Self-pay | Admitting: Internal Medicine

## 2014-05-17 VITALS — BP 128/68 | HR 62 | Temp 98.1°F | Wt 219.5 lb

## 2014-05-17 DIAGNOSIS — R197 Diarrhea, unspecified: Secondary | ICD-10-CM

## 2014-05-17 DIAGNOSIS — I10 Essential (primary) hypertension: Secondary | ICD-10-CM

## 2014-05-17 NOTE — Assessment & Plan Note (Signed)
?  viral enteritis. However has recently been on several antibiotics including cephalosporin and macrobid - will need to r/o C diff infection - sent home with stool container today Discussed importance of keeping up with output - rec increased water intake over next few days. Will also decrease lasix dose and hold spironolactone while diarrhea persists. If persistent sxs - low threshold to return for further labwork (BMP) in h/o acute on chronic renal insufficiency.

## 2014-05-17 NOTE — Patient Instructions (Signed)
Push small sips of water throughout the day to keep up with diarrhea. Let's decrease lasix to 40mg  in am and 20mg  in afternoon and hold spironolactone until feeling better. Return stool container to check for C diff infection.

## 2014-05-17 NOTE — Assessment & Plan Note (Signed)
Recent increased readings presumed 2/2 malaise from diarrheal illness. Today bp stable.

## 2014-05-17 NOTE — Progress Notes (Signed)
BP 128/68  Pulse 62  Temp(Src) 98.1 F (36.7 C) (Oral)  Wt 219 lb 8 oz (99.565 kg)  SpO2 97%   CC: diarrhea Subjective:    Patient ID: [name], female    DOB: 11/13/1940, 73 y.o.   MRN: [id number]  HPI: [name] is a 73 y.o. female presenting on 05/17/2014 for Diarrhea and Hypertension   Recently treated for UTI with 10d macrobid course, UCx grew 80k Ecoli sens macrobid.  Prior to this, recent hospitalization with UTI urosepsis and encephalopathy treated with CTX and vantin s/p stay at NH/rehab. Persistent cloudy urine.  Now with 4d h/o diarrhea with incontinence. Home health NP Bradd Canary scheduled appt today. Very loose/watery, stools 2 times a day, more foul smelling than normal. Tmax 99.1. + sweats and abd pain. No fevers/chills, obvious blood in stool. Known blood loss from GAVE/AVMs  Also had headache this week and bp was 160-178/70s. Compliant with lasix 60mg  in am and 40mg  in pm, spironolactone 12.5mg  daily, and nadolol 10mg  daily.   No sick contacts at home with diarrhea.  BP Readings from Last 3 Encounters:  05/17/14 128/68  05/10/14 116/68  05/06/14 138/45   Lab Results  Component Value Date   CREATININE 0.9 05/05/2014    Relevant past medical, surgical, family and social history reviewed and updated as indicated.  Allergies and medications reviewed and updated. Current Outpatient Prescriptions on File Prior to Visit  Medication Sig  . albuterol (PROAIR HFA) 108 (90 BASE) MCG/ACT inhaler Inhale 2 puffs into the lungs every 6 (six) hours as needed for wheezing or shortness of breath.   . ALPRAZolam (XANAX) 1 MG tablet GIVE 1 TABLET BY MOUTH EVERY 8 HOURS AS NEEDED FOR ANXIETY  . b complex vitamins tablet Take 1 tablet by mouth daily.   [name] buPROPion (WELLBUTRIN XL) 150 MG 24 hr tablet Take 150 mg by mouth daily.  [name] desvenlafaxine (PRISTIQ) 100 MG 24 hr tablet Take 100 mg by mouth daily. Takes with a 50mg  tablet, total daily dose is 150mg   .  desvenlafaxine (PRISTIQ) 50 MG 24 hr tablet Take 50 mg by mouth daily. Takes with a 100mg  tablet, total daily dose is 150mg   . Fluticasone-Salmeterol (ADVAIR) 250-50 MCG/DOSE AEPB Inhale 1 puff into the lungs 2 (two) times daily.  . furosemide (LASIX) 40 MG tablet Take 1 1/2 tablets (60 mg) in the morning and 1 tablet in the afternoon  . guaiFENesin (MUCINEX) 600 MG 12 hr tablet Take 600 mg by mouth 2 (two) times daily. 2 tablet by mouth once daily  . insulin glargine (LANTUS) 100 UNIT/ML injection Inject 0.8 mLs (80 Units total) into the skin at bedtime.  . insulin lispro (HUMALOG) 100 UNIT/ML KiwkPen Inject 15-30 Units into the skin daily with supper. SSI  . lactulose, encephalopathy, (CHRONULAC) 10 GM/15ML SOLN Take 20 g by mouth daily. constipation  . Multiple Vitamins-Minerals (CENTRUM SILVER PO) Take 1 tablet by mouth daily.   . nadolol (CORGARD) 20 MG tablet Take 10 mg by mouth daily.  [name] oxyCODONE (OXY IR/ROXICODONE) 5 MG immediate release tablet Take one tablet by mouth every 4 hours as needed for pain  . pantoprazole (PROTONIX) 40 MG tablet Take 40 mg by mouth 2 (two) times daily.   . promethazine (PHENERGAN) 25 MG tablet Take 1 tablet (25 mg total) by mouth 2 (two) times daily as needed for nausea.  [name] QUEtiapine (SEROQUEL) 400 MG tablet Take 400 mg by mouth at bedtime.  [name] rOPINIRole (REQUIP)  4 MG tablet Take 4 mg by mouth at bedtime.  . Saline (OCEAN NASAL SPRAY NA) Place 1 spray into the nose 3 (three) times daily as needed (congestion).   . simvastatin (ZOCOR) 40 MG tablet Take 40 mg by mouth daily.  [name] spironolactone (ALDACTONE) 12.5 mg TABS tablet Take 0.5 tablets (12.5 mg total) by mouth daily.  . Vitamin D, Ergocalciferol, (DRISDOL) 50000 UNITS CAPS capsule TAKE 1 CAPSULE (50,000 UNITS TOTAL) BY MOUTH EVERY 7 (SEVEN) DAYS. TAKE ON FRIDAY   No current facility-administered medications on file prior to visit.    Review of Systems Per HPI unless specifically indicated above      Objective:    BP 128/68  Pulse 62  Temp(Src) 98.1 F (36.7 C) (Oral)  Wt 219 lb 8 oz (99.565 kg)  SpO2 97%  Physical Exam  Nursing note and vitals reviewed. Constitutional: She appears well-developed and well-nourished.  Morbidly obese in [place], [place] in place  HENT:  Head: Normocephalic and atraumatic.  Mouth/Throat: No oropharyngeal exudate.  Sl dry MM  Eyes: Conjunctivae and EOM are normal. Pupils are equal, round, and reactive to light.  Cardiovascular: Normal rate, regular rhythm and intact distal pulses.   Murmur (3/6 flow murmur) heard. Pulmonary/Chest: Effort normal and breath sounds normal. No respiratory distress. She has no wheezes. She has no rales.  Abdominal: Soft. Bowel sounds are normal. She exhibits no distension and no mass. There is no tenderness. There is no rebound and no guarding.  Musculoskeletal: She exhibits no edema.  Skin: Skin is warm and dry. No rash noted.  Psychiatric: She has a normal mood and affect.   Results for orders placed during the hospital encounter of 05/06/14  POCT HEMOGLOBIN-HEMACUE      Result Value Ref Range   Hemoglobin 9.0 (*) 12.0 - 15.0 g/dL      Assessment & Plan:   Problem List Items Addressed This Visit   HYPERTENSION (Chronic)     Recent increased readings presumed 2/2 malaise from diarrheal illness. Today bp stable.    Diarrhea - Primary     ?viral enteritis. However has recently been on several antibiotics including cephalosporin and macrobid - will need to r/o C diff infection - sent home with stool container today Discussed importance of keeping up with output - rec increased water intake over next few days. Will also decrease lasix dose and hold spironolactone while diarrhea persists. If persistent sxs - low threshold to return for further labwork (BMP) in h/o acute on chronic renal insufficiency.    Relevant Orders      C. difficile GDH and Toxin A/B       Follow up plan: Return if symptoms worsen or fail to  improve.

## 2014-05-17 NOTE — Progress Notes (Signed)
Pre visit review using our clinic review tool, if applicable. No additional management support is needed unless otherwise documented below in the visit note. 

## 2014-05-19 ENCOUNTER — Other Ambulatory Visit: Payer: Self-pay | Admitting: *Deleted

## 2014-05-19 LAB — C. DIFFICILE GDH AND TOXIN A/B
C. DIFF TOXIN A/B: NOT DETECTED
C. DIFFICILE GDH: NOT DETECTED

## 2014-05-19 MED ORDER — SIMVASTATIN 40 MG PO TABS: 40.0000 mg | ORAL_TABLET | Freq: Every day | ORAL | Status: DC

## 2014-05-20 ENCOUNTER — Other Ambulatory Visit: Payer: Self-pay | Admitting: Pulmonary Disease

## 2014-05-20 ENCOUNTER — Encounter (HOSPITAL_COMMUNITY)
Admission: RE | Admit: 2014-05-20 | Discharge: 2014-05-20 | Disposition: A | Discharge status: Home / Self Care | Payer: Medicare Other | Source: Ambulatory Visit | Attending: Family Medicine | Admitting: Family Medicine

## 2014-05-20 ENCOUNTER — Telehealth: Payer: Self-pay

## 2014-05-20 DIAGNOSIS — D509 Iron deficiency anemia, unspecified: Secondary | ICD-10-CM | POA: Diagnosis not present

## 2014-05-20 LAB — POCT HEMOGLOBIN-HEMACUE: HEMOGLOBIN: 9.5 g/dL — AB (ref 12.0–15.0)

## 2014-05-20 MED ORDER — DARBEPOETIN ALFA-POLYSORBATE 200 MCG/0.4ML IJ SOLN
INTRAMUSCULAR | Status: AC
  Filled 2014-05-20: qty 0.4

## 2014-05-20 MED ORDER — FLUTICASONE-SALMETEROL 250-50 MCG/DOSE IN AEPB: 1.0000 | INHALATION_SPRAY | Freq: Two times a day (BID) | RESPIRATORY_TRACT | Status: DC

## 2014-05-20 MED ORDER — DARBEPOETIN ALFA-POLYSORBATE 200 MCG/0.4ML IJ SOLN
200.0000 ug | INTRAMUSCULAR | Status: DC
  Administered 2014-05-20: 200 ug via SUBCUTANEOUS

## 2014-05-20 NOTE — Telephone Encounter (Signed)
Marlowe Kays OT with [facility] left v/m requesting verbal order to extend home health OT 2 x a week for 2 more weeks.Please advise.

## 2014-05-20 NOTE — Telephone Encounter (Signed)
plz give verbal ok for this. 

## 2014-05-23 NOTE — Telephone Encounter (Signed)
Called [place] from [facility]. No answer left voicemail requesting a return call.

## 2014-05-24 NOTE — Telephone Encounter (Signed)
I called and gave verbal order to [place].

## 2014-05-28 ENCOUNTER — Other Ambulatory Visit: Payer: Self-pay | Admitting: Internal Medicine

## 2014-05-30 ENCOUNTER — Other Ambulatory Visit: Payer: Self-pay | Admitting: Pulmonary Disease

## 2014-05-31 ENCOUNTER — Telehealth: Payer: Self-pay | Admitting: Pulmonary Disease

## 2014-05-31 ENCOUNTER — Other Ambulatory Visit: Payer: Self-pay | Admitting: Cardiovascular Disease

## 2014-05-31 MED ORDER — FLUTICASONE-SALMETEROL 250-50 MCG/DOSE IN AEPB: 1.0000 | INHALATION_SPRAY | Freq: Two times a day (BID) | RESPIRATORY_TRACT | Status: DC

## 2014-05-31 NOTE — Telephone Encounter (Signed)
Samples at front, pt is aware. Patterson Bing, CMA

## 2014-06-03 ENCOUNTER — Encounter (HOSPITAL_COMMUNITY): Payer: Medicare Other

## 2014-06-03 ENCOUNTER — Encounter (HOSPITAL_COMMUNITY)
Admission: RE | Admit: 2014-06-03 | Discharge: 2014-06-03 | Disposition: A | Discharge status: Home / Self Care | Payer: Medicare Other | Source: Ambulatory Visit | Attending: Pulmonary Disease | Admitting: Pulmonary Disease

## 2014-06-03 DIAGNOSIS — Q2733 Arteriovenous malformation of digestive system vessel: Secondary | ICD-10-CM | POA: Diagnosis not present

## 2014-06-03 DIAGNOSIS — D649 Anemia, unspecified: Secondary | ICD-10-CM | POA: Diagnosis not present

## 2014-06-03 DIAGNOSIS — K7460 Unspecified cirrhosis of liver: Secondary | ICD-10-CM | POA: Diagnosis not present

## 2014-06-03 DIAGNOSIS — N289 Disorder of kidney and ureter, unspecified: Secondary | ICD-10-CM | POA: Insufficient documentation

## 2014-06-03 LAB — POCT HEMOGLOBIN-HEMACUE: HEMOGLOBIN: 9.6 g/dL — AB (ref 12.0–15.0)

## 2014-06-03 MED ORDER — DARBEPOETIN ALFA-POLYSORBATE 200 MCG/0.4ML IJ SOLN
INTRAMUSCULAR | Status: AC
  Administered 2014-06-03: 200 ug via SUBCUTANEOUS
  Filled 2014-06-03: qty 0.4

## 2014-06-03 MED ORDER — DARBEPOETIN ALFA-POLYSORBATE 200 MCG/0.4ML IJ SOLN: 200.0000 ug | INTRAMUSCULAR | Status: DC

## 2014-06-09 ENCOUNTER — Encounter: Payer: Self-pay | Admitting: Family Medicine

## 2014-06-09 ENCOUNTER — Ambulatory Visit (INDEPENDENT_AMBULATORY_CARE_PROVIDER_SITE_OTHER): Payer: Medicare Other | Admitting: Family Medicine

## 2014-06-09 VITALS — BP 136/64 | HR 64 | Temp 98.0°F | Wt 223.2 lb

## 2014-06-09 DIAGNOSIS — I10 Essential (primary) hypertension: Secondary | ICD-10-CM

## 2014-06-09 DIAGNOSIS — M79605 Pain in left leg: Secondary | ICD-10-CM

## 2014-06-09 DIAGNOSIS — I5032 Chronic diastolic (congestive) heart failure: Secondary | ICD-10-CM

## 2014-06-09 DIAGNOSIS — M79604 Pain in right leg: Secondary | ICD-10-CM

## 2014-06-09 LAB — RENAL FUNCTION PANEL
Albumin: 2.8 g/dL — ABNORMAL LOW (ref 3.5–5.2)
BUN: 33 mg/dL — ABNORMAL HIGH (ref 6–23)
CO2: 30 mEq/L (ref 19–32)
CREATININE: 1.1 mg/dL (ref 0.4–1.2)
Calcium: 9 mg/dL (ref 8.4–10.5)
Chloride: 103 mEq/L (ref 96–112)
GFR: 54.33 mL/min — AB (ref >60.00)
Glucose, Bld: 182 mg/dL — ABNORMAL HIGH (ref 70–99)
PHOSPHORUS: 3.7 mg/dL (ref 2.3–4.6)
Potassium: 3.6 mEq/L (ref 3.5–5.1)
Sodium: 140 mEq/L (ref 135–145)

## 2014-06-09 LAB — CK: CK TOTAL: 166 U/L (ref 7–177)

## 2014-06-09 MED ORDER — PROMETHAZINE HCL 25 MG PO TABS: 25.0000 mg | ORAL_TABLET | Freq: Two times a day (BID) | ORAL | Status: DC | PRN

## 2014-06-09 MED ORDER — MUPIROCIN CALCIUM 2 % EX CREA: 1.0000 "application " | TOPICAL_CREAM | Freq: Two times a day (BID) | CUTANEOUS | Status: DC

## 2014-06-09 NOTE — Progress Notes (Signed)
Pre visit review using our clinic review tool, if applicable. No additional management support is needed unless otherwise documented below in the visit note. 

## 2014-06-09 NOTE — Patient Instructions (Addendum)
Back down on lasix to 40mg  in morning and 20mg  in evening. Blood work today to check kidneys and potassium. Try antibiotic cream sent to pharmacy for skin wounds. Return in 1 month for follow up

## 2014-06-09 NOTE — Assessment & Plan Note (Signed)
Chronic, stable. Continue current regimen. 

## 2014-06-09 NOTE — Progress Notes (Signed)
BP 136/64  Pulse 64  Temp(Src) 98 F (36.7 C) (Oral)  Wt 223 lb 4 oz (101.266 kg)   CC: 1 mo f/u  Subjective:    Patient ID: [name], female    DOB: 1941/01/04, 73 y.o.   MRN: [id number]  HPI: [name] is a 73 y.o. female presenting on 06/09/2014 for Follow-up   Recently seen with diarrhea after Ecoli UTI treated with macrobid. C diff test returned negative. diarhrea has resolved.  Feeling poorly today - "drunk, nauseated". Describes imbalance and increased sedation this past week. Persistent dyspnea.  Lesions on abdomen noted over several months prior to hospitalization. Now getting better. Uses antifungal powder and neosporin. Improved on antibiotics.  Chronic anemia has been very stable recently.  Wt Readings from Last 3 Encounters:  06/09/14 223 lb 4 oz (101.266 kg)  05/17/14 219 lb 8 oz (99.565 kg)  05/10/14 219 lb (99.338 kg)  Body mass index is 38.3 kg/(m^2).  2D echo: Study Conclusions - Left ventricle: The cavity size was normal. Systolic function was normal. The estimated ejection fraction was in the range of 55% to 60%. Wall motion was normal; there were no regional wall motion abnormalities. - Aortic valve: Mildly thickened leaflets. There was very mild stenosis. There was mild regurgitation. Valve area (VTI): 2.89 cm^2. Valve area (Vmax): 2.7 cm^2. - Mitral valve: Moderately to severely calcified annulus. - Left atrium: The atrium was mildly to moderately dilated.   Relevant past medical, surgical, family and social history reviewed and updated as indicated.  Allergies and medications reviewed and updated. Current Outpatient Prescriptions on File Prior to Visit  Medication Sig  . albuterol (PROAIR HFA) 108 (90 BASE) MCG/ACT inhaler Inhale 2 puffs into the lungs every 6 (six) hours as needed for wheezing or shortness of breath.   . ALPRAZolam (XANAX) 1 MG tablet GIVE 1 TABLET BY MOUTH EVERY 8 HOURS AS NEEDED FOR ANXIETY  . b complex vitamins  tablet Take 1 tablet by mouth daily.   [name] buPROPion (WELLBUTRIN XL) 150 MG 24 hr tablet Take 150 mg by mouth daily.  [name] desvenlafaxine (PRISTIQ) 100 MG 24 hr tablet Take 100 mg by mouth daily. Takes with a 50mg  tablet, total daily dose is 150mg   . desvenlafaxine (PRISTIQ) 50 MG 24 hr tablet Take 50 mg by mouth daily. Takes with a 100mg  tablet, total daily dose is 150mg   . Fluticasone-Salmeterol (ADVAIR) 250-50 MCG/DOSE AEPB Inhale 1 puff into the lungs 2 (two) times daily.  . furosemide (LASIX) 40 MG tablet Take 1 tablets (40 mg) in the morning and 1/2 tablet in the afternoon  . guaiFENesin (MUCINEX) 600 MG 12 hr tablet Take 600 mg by mouth 2 (two) times daily. 2 tablet by mouth once daily  . insulin glargine (LANTUS) 100 UNIT/ML injection Inject 0.8 mLs (80 Units total) into the skin at bedtime.  [name] lactulose, encephalopathy, (CHRONULAC) 10 GM/15ML SOLN Take 20 g by mouth daily. constipation  . Multiple Vitamins-Minerals (CENTRUM SILVER PO) Take 1 tablet by mouth daily.   . nadolol (CORGARD) 20 MG tablet Take 0.5 tablets (10 mg total) by mouth daily.  . NON FORMULARY 3 liters oxygen 24/7  . oxyCODONE (OXY IR/ROXICODONE) 5 MG immediate release tablet Take one tablet by mouth every 4 hours as needed for pain  . pantoprazole (PROTONIX) 40 MG tablet Take 40 mg by mouth 2 (two) times daily.   . QUEtiapine (SEROQUEL) 400 MG tablet Take 400 mg by mouth at bedtime.  [name]  rOPINIRole (REQUIP) 4 MG tablet Take 4 mg by mouth at bedtime.  . Saline (OCEAN NASAL SPRAY NA) Place 1 spray into the nose 3 (three) times daily as needed (congestion).   . simvastatin (ZOCOR) 40 MG tablet Take 1 tablet (40 mg total) by mouth daily.  [name] spironolactone (ALDACTONE) 12.5 mg TABS tablet Take 0.5 tablets (12.5 mg total) by mouth daily.  . Vitamin D, Ergocalciferol, (DRISDOL) 50000 UNITS CAPS capsule TAKE 1 CAPSULE (50,000 UNITS TOTAL) BY MOUTH EVERY 7 (SEVEN) DAYS. TAKE ON FRIDAY  . XIFAXAN 550 MG TABS tablet TAKE 1 TABLET (550 MG  TOTAL) BY MOUTH DAILY.  [name] insulin lispro (HUMALOG) 100 UNIT/ML KiwkPen Inject 15-30 Units into the skin daily with supper. SSI   No current facility-administered medications on file prior to visit.    Review of Systems Per HPI unless specifically indicated above    Objective:    BP 136/64  Pulse 64  Temp(Src) 98 F (36.7 C) (Oral)  Wt 223 lb 4 oz (101.266 kg)  Physical Exam  Nursing note and vitals reviewed. Constitutional: She appears well-developed and well-nourished. No distress.  In wheelchair, 3L O2 by [place]  HENT:  Mouth/Throat: Oropharynx is clear and moist. No oropharyngeal exudate.  Cardiovascular: Normal rate, regular rhythm and intact distal pulses.   Murmur (3/6 SEM) heard. Pulmonary/Chest: Effort normal and breath sounds normal. No respiratory distress. She has no wheezes. She has no rales.  Musculoskeletal: She exhibits no edema.  Skin: Rash noted. There is erythema.  Erosions on skin on abdomen and L shoulder, multiple stages of healing.   Results for orders placed during the hospital encounter of 06/03/14  POCT HEMOGLOBIN-HEMACUE      Result Value Ref Range   Hemoglobin 9.6 (*) 12.0 - 15.0 g/dL      Assessment & Plan:   Problem List Items Addressed This Visit   Essential hypertension - Primary     Chronic, stable. Continue current regimen.    Relevant Orders      Renal function panel   Chronic diastolic CHF (congestive heart failure) (Chronic)     Continue lower lasix dose - 40mg  in am and 20mg  in afternoon. Check K and Cr today. No significant pedal edema, seems euvolemic today.        Follow up plan: Return in about 1 month (around 07/10/2014), or if symptoms worsen or fail to improve.

## 2014-06-09 NOTE — Assessment & Plan Note (Signed)
Continue lower lasix dose - 40mg  in am and 20mg  in afternoon. Check K and Cr today. No significant pedal edema, seems euvolemic today.

## 2014-06-11 ENCOUNTER — Encounter: Payer: Self-pay | Admitting: Family Medicine

## 2014-06-11 DIAGNOSIS — E46 Unspecified protein-calorie malnutrition: Secondary | ICD-10-CM | POA: Insufficient documentation

## 2014-06-12 ENCOUNTER — Other Ambulatory Visit (HOSPITAL_COMMUNITY): Payer: Self-pay | Admitting: Pulmonary Disease

## 2014-06-12 ENCOUNTER — Other Ambulatory Visit: Payer: Self-pay | Admitting: Internal Medicine

## 2014-06-13 ENCOUNTER — Other Ambulatory Visit: Payer: Self-pay | Admitting: Internal Medicine

## 2014-06-15 ENCOUNTER — Other Ambulatory Visit: Payer: Self-pay | Admitting: Internal Medicine

## 2014-06-17 ENCOUNTER — Encounter (HOSPITAL_COMMUNITY)
Admission: RE | Admit: 2014-06-17 | Discharge: 2014-06-17 | Disposition: A | Discharge status: Home / Self Care | Payer: Medicare Other | Source: Ambulatory Visit | Attending: Family Medicine | Admitting: Family Medicine

## 2014-06-17 DIAGNOSIS — D649 Anemia, unspecified: Secondary | ICD-10-CM | POA: Diagnosis not present

## 2014-06-17 LAB — POCT HEMOGLOBIN-HEMACUE: Hemoglobin: 9.7 g/dL — ABNORMAL LOW (ref 12.0–15.0)

## 2014-06-17 MED ORDER — DARBEPOETIN ALFA-POLYSORBATE 200 MCG/0.4ML IJ SOLN
INTRAMUSCULAR | Status: AC
  Filled 2014-06-17: qty 0.4

## 2014-06-17 MED ORDER — DARBEPOETIN ALFA-POLYSORBATE 200 MCG/0.4ML IJ SOLN
200.0000 ug | INTRAMUSCULAR | Status: DC
  Administered 2014-06-17: 200 ug via SUBCUTANEOUS

## 2014-06-20 ENCOUNTER — Encounter: Payer: Self-pay | Admitting: *Deleted

## 2014-06-22 ENCOUNTER — Other Ambulatory Visit: Payer: Self-pay | Admitting: Endocrinology

## 2014-06-27 ENCOUNTER — Other Ambulatory Visit: Payer: Self-pay | Admitting: Cardiovascular Disease

## 2014-06-27 ENCOUNTER — Other Ambulatory Visit: Payer: Self-pay | Admitting: Internal Medicine

## 2014-06-28 ENCOUNTER — Telehealth: Payer: Self-pay | Admitting: Pulmonary Disease

## 2014-06-28 MED ORDER — FLUTICASONE-SALMETEROL 250-50 MCG/DOSE IN AEPB: 1.0000 | INHALATION_SPRAY | Freq: Two times a day (BID) | RESPIRATORY_TRACT | Status: DC

## 2014-06-28 NOTE — Telephone Encounter (Signed)
Spouse aware samples left for pick up. Nothing further needed

## 2014-06-30 ENCOUNTER — Other Ambulatory Visit: Payer: Self-pay | Admitting: Internal Medicine

## 2014-06-30 ENCOUNTER — Other Ambulatory Visit: Payer: Self-pay | Admitting: Family Medicine

## 2014-07-01 ENCOUNTER — Telehealth: Payer: Self-pay

## 2014-07-01 ENCOUNTER — Encounter (HOSPITAL_COMMUNITY)
Admission: RE | Admit: 2014-07-01 | Discharge: 2014-07-01 | Disposition: A | Discharge status: Home / Self Care | Payer: Medicare Other | Source: Ambulatory Visit | Attending: Pulmonary Disease | Admitting: Pulmonary Disease

## 2014-07-01 DIAGNOSIS — Q2733 Arteriovenous malformation of digestive system vessel: Secondary | ICD-10-CM | POA: Diagnosis not present

## 2014-07-01 DIAGNOSIS — N289 Disorder of kidney and ureter, unspecified: Secondary | ICD-10-CM | POA: Insufficient documentation

## 2014-07-01 DIAGNOSIS — K7460 Unspecified cirrhosis of liver: Secondary | ICD-10-CM | POA: Insufficient documentation

## 2014-07-01 DIAGNOSIS — D649 Anemia, unspecified: Secondary | ICD-10-CM | POA: Insufficient documentation

## 2014-07-01 LAB — POCT HEMOGLOBIN-HEMACUE: Hemoglobin: 9 g/dL — ABNORMAL LOW (ref 12.0–15.0)

## 2014-07-01 MED ORDER — DARBEPOETIN ALFA 200 MCG/0.4ML IJ SOSY: 200.0000 ug | PREFILLED_SYRINGE | INTRAMUSCULAR | Status: DC

## 2014-07-01 MED ORDER — DARBEPOETIN ALFA 200 MCG/0.4ML IJ SOSY
PREFILLED_SYRINGE | INTRAMUSCULAR | Status: AC
  Administered 2014-07-01: 200 ug via SUBCUTANEOUS
  Filled 2014-07-01: qty 0.4

## 2014-07-01 NOTE — Telephone Encounter (Signed)
Pt called requesting refills of meds; asked the names of meds needing refill and pt advised she is "out of sorts now" and will ck with pharmacy later.  Advised pt if anything needed from our office to give [place] a call. Pt voiced understanding.

## 2014-07-04 ENCOUNTER — Other Ambulatory Visit: Payer: Self-pay | Admitting: Pulmonary Disease

## 2014-07-05 ENCOUNTER — Encounter: Payer: Self-pay | Admitting: *Deleted

## 2014-07-05 ENCOUNTER — Ambulatory Visit: Payer: Medicare Other | Admitting: Internal Medicine

## 2014-07-06 ENCOUNTER — Telehealth: Payer: Self-pay

## 2014-07-06 MED ORDER — LISINOPRIL 5 MG PO TABS: ORAL_TABLET | ORAL | Status: DC

## 2014-07-06 NOTE — Telephone Encounter (Signed)
Pt left v/m requesting refill lisinopril 5 mg taking one at hs; not on current med list; previously prescribed by another PCP. Pt request cb.

## 2014-07-06 NOTE — Telephone Encounter (Signed)
Sent in . plz notify patient. 

## 2014-07-07 ENCOUNTER — Other Ambulatory Visit (INDEPENDENT_AMBULATORY_CARE_PROVIDER_SITE_OTHER): Payer: Medicare Other

## 2014-07-07 ENCOUNTER — Ambulatory Visit (INDEPENDENT_AMBULATORY_CARE_PROVIDER_SITE_OTHER): Payer: Medicare Other | Admitting: Physician Assistant

## 2014-07-07 ENCOUNTER — Encounter: Payer: Self-pay | Admitting: Physician Assistant

## 2014-07-07 VITALS — BP 126/82 | HR 60 | Ht 64.0 in | Wt 227.5 lb

## 2014-07-07 DIAGNOSIS — R300 Dysuria: Secondary | ICD-10-CM

## 2014-07-07 DIAGNOSIS — K7581 Nonalcoholic steatohepatitis (NASH): Secondary | ICD-10-CM

## 2014-07-07 DIAGNOSIS — K7460 Unspecified cirrhosis of liver: Secondary | ICD-10-CM

## 2014-07-07 LAB — HEPATIC FUNCTION PANEL
ALBUMIN: 3 g/dL — AB (ref 3.5–5.2)
ALK PHOS: 119 U/L — AB (ref 39–117)
ALT: 36 U/L — AB (ref 0–35)
AST: 38 U/L — AB (ref 0–37)
Bilirubin, Direct: 0.4 mg/dL — ABNORMAL HIGH (ref 0.0–0.3)
TOTAL PROTEIN: 6.5 g/dL (ref 6.0–8.3)
Total Bilirubin: 1.3 mg/dL — ABNORMAL HIGH (ref 0.2–1.2)

## 2014-07-07 MED ORDER — POLYETHYLENE GLYCOL 3350 17 GM/SCOOP PO POWD: 1.0000 | Freq: Every day | ORAL | Status: DC

## 2014-07-07 MED ORDER — PANTOPRAZOLE SODIUM 40 MG PO TBEC: 40.0000 mg | DELAYED_RELEASE_TABLET | Freq: Two times a day (BID) | ORAL | Status: DC

## 2014-07-07 MED ORDER — LACTULOSE ENCEPHALOPATHY 10 GM/15ML PO SOLN: 20.0000 g | Freq: Every day | ORAL | Status: DC

## 2014-07-07 NOTE — Telephone Encounter (Signed)
Message left notifying patient.

## 2014-07-07 NOTE — Progress Notes (Signed)
Patient ID: [name], female   DOB: 1941-08-22, 73 y.o.   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 She was recently evaluated by her primary care physician after having a bout of diarrhea after an Escherichia coli urinary tract infection treated with Macrobid. Her C. difficile test returned negative and her diarrhea has resolved. She is here for a routine follow-up for her cirrhosis. She states she never feels good and she is very frustrated with her limited mobility from her multiple medical problems she has not had any trouble with confusion. She continues to use Xifaxan and lactulose and Aldactone.She does complain of urinary frequency and some burning today.   Past Medical History  Diagnosis Date  . Chronic airway obstruction, not elsewhere classified   . Unspecified chronic bronchitis   . Unspecified essential hypertension   . Non-obstructive CAD   . Palpitations   . Mild aortic stenosis     a. 03/2014 Valve area (VTI): 2.89 cm^2, Valve area (Vmax): 2.7 cm^2.  . Pure hypercholesterolemia   . Morbid obesity   . Esophageal reflux   . Angiodysplasia of intestine (without mention of hemorrhage)   . Diverticulosis of colon (without mention of hemorrhage)   . Benign neoplasm of colon   . Autoimmune hepatitis   . Cystitis, unspecified   . Osteoarthrosis, unspecified whether generalized or localized, unspecified site   . Osteoporosis, unspecified   . Restless legs syndrome (RLS)   . Major depressive disorder, recurrent episode, severe, specified as with psychotic behavior   . Iron deficiency anemia secondary to blood loss (chronic)     a. frequent PRBC transfusions.  . Coarse tremors     a. arms.  . Carpal tunnel syndrome on  both sides   . Palpitations   . Chronic diastolic CHF (congestive heart failure)     a. 03/26/2014 Echo: EF 55-60%, no rwma, mild AS/AI, mod-sev Ca2+ MV annulus, mildly to mod dil LA.  [name] Asthma   . OSA (obstructive sleep apnea)     a. does not use CPAP.  [name] Type II diabetes mellitus   . H/O hiatal hernia   . Cirrhosis     a. dx'd 1990's  . Adenomatous colon polyp   . Midsternal chest pain     a. conservatively managed ->poor candidate for cath/anticoagulation.  [name] Anxiety   . History of pneumonia   . AVM (arteriovenous malformation)   . Portal hypertensive gastropathy   . Falls frequently   . UTI (lower urinary tract infection)   . Hiatal hernia   . GAVE (gastric antral vascular ectasia)   . Nonalcoholic steatohepatitis (NASH)   . Thrombocytopenia   . Protein calorie malnutrition     Past Surgical History  Procedure Laterality Date  . Hemorroidectomy    . Appendectomy    . Vaginal hysterectomy    . Breast biopsy      bilateral  . Cesarean section      x 3  . Appendectomy    . Esophagogastroduodenoscopy  06/12/2012    Procedure: ESOPHAGOGASTRODUODENOSCOPY (EGD);  Surgeon: Lafayette Dragon, MD;  Location: [facility];  Service: Endoscopy;  Laterality: N/A;  . Balloon dilation  06/12/2012    Procedure: BALLOON DILATION;  Surgeon: Lafayette Dragon, MD;  Location: [facility];  Service: Endoscopy;  Laterality: N/A;  ?balloon  . Esophagogastroduodenoscopy  09/10/2012    Procedure: ESOPHAGOGASTRODUODENOSCOPY (EGD);  Surgeon: Lafayette Dragon, MD;  Location: [facility];  Service: Endoscopy;  Laterality: N/A;  . Hot hemostasis  09/10/2012    Procedure: HOT HEMOSTASIS (ARGON PLASMA COAGULATION/BICAP);  Surgeon: Lafayette Dragon, MD;  Location: [facility];  Service: Endoscopy;  Laterality: N/A;  . Esophagogastroduodenoscopy N/A 01/18/2013    Procedure: ESOPHAGOGASTRODUODENOSCOPY (EGD);  Surgeon: Lafayette Dragon, MD;  Location: [facility];  Service: Endoscopy;  Laterality: N/A;  .  Esophagogastroduodenoscopy N/A 05/24/2013    Procedure: ESOPHAGOGASTRODUODENOSCOPY (EGD);  Surgeon: Jerene Bears, MD;  Location: [facility];  Service: Gastroenterology;  Laterality: N/A;  . Hot hemostasis N/A 05/24/2013    Procedure: HOT HEMOSTASIS (ARGON PLASMA COAGULATION/BICAP);  Surgeon: Jerene Bears, MD;  Location: [facility];  Service: Gastroenterology;  Laterality: N/A;   Family History  Problem Relation Age of Onset  . Heart disease Mother   . Cervical cancer Mother   . Kidney disease Mother   . Diabetes Mother   . Breast cancer Sister   . Multiple sclerosis Sister   . Colon cancer Neg Hx   . Breast cancer Maternal Aunt     x 2  . Diabetes Father   . Diabetes Sister   . Multiple sclerosis Other    History  Substance Use Topics  . Smoking status: Former Smoker -- 1.50 packs/day for 25 years    Types: Cigarettes    Quit date: 08/26/1992  . Smokeless tobacco: Never Used  . Alcohol Use: No     Comment: 7/84/69 "last alcoholic drink was years ago"   Current Outpatient Prescriptions  Medication Sig Dispense Refill  . albuterol (PROAIR HFA) 108 (90 BASE) MCG/ACT inhaler Inhale 2 puffs into the lungs every 6 (six) hours as needed for wheezing or shortness of breath.     . ALPRAZolam (XANAX) 1 MG tablet GIVE 1 TABLET BY MOUTH EVERY 8 HOURS AS NEEDED FOR ANXIETY 30 tablet 0  . b complex vitamins tablet Take 1 tablet by mouth daily.     . B-D ULTRAFINE III SHORT PEN 31G X 8 MM MISC USE WITH LANTUS 30 each 0  . buPROPion (WELLBUTRIN XL) 150 MG 24 hr tablet Take 150 mg by mouth daily.    [name] desvenlafaxine (PRISTIQ) 100 MG 24 hr tablet Take 100 mg by mouth daily. Takes with a 50mg  tablet, total daily dose is 150mg     . desvenlafaxine (PRISTIQ) 50 MG 24 hr tablet Take 50 mg by mouth daily. Takes with a 100mg  tablet, total daily dose is 150mg     . Fluticasone-Salmeterol (ADVAIR) 250-50 MCG/DOSE AEPB Inhale 1 puff into the lungs 2 (two) times daily. 2 each 0  . furosemide (LASIX) 40 MG  tablet Take 1 tablets (40 mg) in the morning and 1/2 tablet in the afternoon    . guaiFENesin (MUCINEX) 600 MG 12 hr tablet Take 600 mg by mouth 2 (two) times daily. 2 tablet by mouth once daily    . insulin glargine (LANTUS) 100 UNIT/ML injection Inject 0.8 mLs (80 Units total) into the skin at bedtime. 10 mL 11  . insulin lispro (HUMALOG) 100 UNIT/ML KiwkPen Inject 15-30 Units into the skin daily with supper. SSI    . lactulose, encephalopathy, (CHRONULAC) 10 GM/15ML SOLN Take 30 mLs (20 g total) by mouth daily. constipation 2700 mL 6  . lisinopril (PRINIVIL,ZESTRIL) 5 MG tablet TAKE 1 TABLET (5  MG TOTAL) BY MOUTH DAILY. 30 tablet 6  . Multiple Vitamins-Minerals (CENTRUM SILVER PO) Take 1 tablet by mouth daily.     . mupirocin cream (BACTROBAN) 2 % APPLY 1 APPLICATION TOPICALLY 2 (TWO) TIMES DAILY. FOR SKIN LESIONS 30 g 0  . nadolol (CORGARD) 20 MG tablet Take 0.5 tablets (10 mg total) by mouth daily. 15 tablet 2  . NON FORMULARY 3 liters oxygen 24/7    . ONE TOUCH ULTRA TEST test strip   12  . ONETOUCH DELICA LANCETS 58N MISC   10  . oxyCODONE (OXY IR/ROXICODONE) 5 MG immediate release tablet Take one tablet by mouth every 4 hours as needed for pain 180 tablet 0  . pantoprazole (PROTONIX) 40 MG tablet Take 1 tablet (40 mg total) by mouth 2 (two) times daily. 60 tablet 6  . promethazine (PHENERGAN) 25 MG tablet Take 1 tablet (25 mg total) by mouth 2 (two) times daily as needed for nausea. 30 tablet 3  . QUEtiapine (SEROQUEL) 400 MG tablet Take 400 mg by mouth at bedtime.    [name] rOPINIRole (REQUIP) 4 MG tablet Take 4 mg by mouth at bedtime.    . Saline (OCEAN NASAL SPRAY NA) Place 1 spray into the nose 3 (three) times daily as needed (congestion).     . simvastatin (ZOCOR) 40 MG tablet Take 1 tablet (40 mg total) by mouth daily. 90 tablet 1  . spironolactone (ALDACTONE) 25 MG tablet TAKE 1/2 TABLET EVERY DAY 15 tablet 1  . Vitamin D, Ergocalciferol, (DRISDOL) 50000 UNITS CAPS capsule TAKE 1  CAPSULE (50,000 UNITS TOTAL) BY MOUTH EVERY 7 (SEVEN) DAYS. TAKE ON FRIDAY 5 capsule 3  . XIFAXAN 550 MG TABS tablet TAKE 1 TABLET (550 MG TOTAL) BY MOUTH DAILY. 30 tablet 0  . polyethylene glycol powder (GLYCOLAX/MIRALAX) powder Take 255 g by mouth daily. 255 g 3   No current facility-administered medications for this visit.   Allergies  Allergen Reactions  . Aspirin Other (See Comments)    Causes nosebleeds  . Penicillins Itching  . Theophylline Nausea And Vomiting      Review of Systems: Gen: Denies any fever, chills CV: Denies chest pain Resp:oxygen dependent GI: Denies vomiting blood, jaundice, and fecal incontinence.   Denies dysphagia or odynophagia. GU : has dysuria MS: Denies joint pain, limitation of movement, and swelling, stiffness, low back pain, extremity pain. Denies muscle weakness, cramps, atrophy.  Derm: Denies rash, itching, dry skin, hives, moles, warts, or unhealing ulcers.  Psych: Denies depression, anxiety, memory loss, suicidal ideation, hallucinations, paranoia, and confusion. Heme: Denies bruising, bleeding, and enlarged lymph nodes. Neuro:  Denies any headaches, dizziness, paresthesia    Physical Exam: General: Pleasant, well developed ,female in no acute distress with nasal cannula Head: Normocephalic and atraumatic Eyes:  sclerae anicteric, conjunctiva pink  Ears: Normal auditory acuity Lungs: Clear throughout to auscultation Heart: Regular rate and rhythm 3/6 murmur Abdomen: Soft, non distended, non-tender. No masses, no hepatomegaly. Normal bowel sounds, excoriated rash on abdomen Musculoskeletal: Symmetrical with no gross deformities  Extremities: 1+ edema LE  Neurological: Alert oriented x 4, grossly nonfocal Psychological:  Alert and cooperative. Normal mood and affect  Assessment and Recommendations: 73 year old female with advanced non-alcohol related cirrhosis, portal hypertension here for follow-up. Her recent creatinine was 1.1 and  GFR was 54.33 she has been started on Procrit at adult day care per the patient. She states that she is having a CBC done at her primary care providers next week. We will  check her ammonia level in her hepatic functions. She is to continue Xifaxan and lactulose. She will continue furosemide 40 mg in the morning and 20 mg in the afternoon .She will continue spironolactone 25 mg a half tablet daily. She will continue pantoprazole 40 mg twice daily and she will follow-up in 3 months sooner if needed.Due to her complaints of dysuria, a urinalysis and urine culture and sensitivity will be obtained today as well.    [name], Deloris Ping 07/07/2014,

## 2014-07-07 NOTE — Patient Instructions (Signed)
We have sent the following medications to your pharmacy for you to pick up at your convenience: Your physician has requested that you go to the basement for the following lab work before leaving today: Follow up with Dr Olevia Perches in Feb. 2016 CC:  [name] Bush MD

## 2014-07-08 LAB — HM DIABETES FOOT EXAM

## 2014-07-09 ENCOUNTER — Other Ambulatory Visit: Payer: Self-pay | Admitting: Internal Medicine

## 2014-07-10 NOTE — Progress Notes (Signed)
Reviewed and agree. Severe liver disease. And complications of portal hypertension.

## 2014-07-11 ENCOUNTER — Ambulatory Visit: Payer: Medicare Other | Admitting: Family Medicine

## 2014-07-11 ENCOUNTER — Other Ambulatory Visit: Payer: Self-pay

## 2014-07-11 DIAGNOSIS — K7581 Nonalcoholic steatohepatitis (NASH): Secondary | ICD-10-CM

## 2014-07-12 ENCOUNTER — Other Ambulatory Visit: Payer: Medicare Other

## 2014-07-12 ENCOUNTER — Encounter: Payer: Self-pay | Admitting: *Deleted

## 2014-07-12 ENCOUNTER — Ambulatory Visit (INDEPENDENT_AMBULATORY_CARE_PROVIDER_SITE_OTHER): Payer: Medicare Other | Admitting: Family Medicine

## 2014-07-12 ENCOUNTER — Encounter: Payer: Self-pay | Admitting: Family Medicine

## 2014-07-12 VITALS — BP 124/62 | HR 56 | Temp 98.1°F | Wt 230.2 lb

## 2014-07-12 DIAGNOSIS — F333 Major depressive disorder, recurrent, severe with psychotic symptoms: Secondary | ICD-10-CM

## 2014-07-12 DIAGNOSIS — N3000 Acute cystitis without hematuria: Secondary | ICD-10-CM | POA: Insufficient documentation

## 2014-07-12 DIAGNOSIS — R8290 Unspecified abnormal findings in urine: Secondary | ICD-10-CM

## 2014-07-12 DIAGNOSIS — K7469 Other cirrhosis of liver: Secondary | ICD-10-CM

## 2014-07-12 DIAGNOSIS — K754 Autoimmune hepatitis: Secondary | ICD-10-CM

## 2014-07-12 LAB — POCT URINALYSIS DIPSTICK
BILIRUBIN UA: NEGATIVE
Blood, UA: NEGATIVE
Glucose, UA: NEGATIVE
KETONES UA: NEGATIVE
LEUKOCYTES UA: NEGATIVE
Nitrite, UA: POSITIVE
PH UA: 6
PROTEIN UA: NEGATIVE
SPEC GRAV UA: 1.02
Urobilinogen, UA: 0.2

## 2014-07-12 MED ORDER — SULFAMETHOXAZOLE-TRIMETHOPRIM 800-160 MG PO TABS: 1.0000 | ORAL_TABLET | Freq: Two times a day (BID) | ORAL | Status: DC

## 2014-07-12 NOTE — Assessment & Plan Note (Signed)
Check ammonia level today. Will route to GI.

## 2014-07-12 NOTE — Telephone Encounter (Signed)
error 

## 2014-07-12 NOTE — Progress Notes (Addendum)
BP 124/62 mmHg  Pulse 56  Temp(Src) 98.1 F (36.7 C) (Oral)  Wt 230 lb 4 oz (104.441 kg)   CC: 1 mo f/u visit  Subjective:    Patient ID: [name], female    DOB: 03-11-1941, 73 y.o.   MRN: [id number]  HPI: [name] is a 73 y.o. female presenting on 07/12/2014 for Follow-up   Feel last Friday at night while going to bathroom. Lost balance when she went to pick something up from floor. Landed on R side but denies injury.   Saw GI PA last week, had LFTs drawn, did not have urine or ammonia tested. Requests testing for this today.   Endorses "hot urine" and darker urine. This is how UTI presented in the past. + frequency, but no fever, urgency or dysuria. No hematuria. Persistent nausea. H/o recurrent cystitis.  Depression - sees Dr. Georgiann Cocker and his PA. She is retiring at the beginning of the year. Planning on continuing to see Bradd Canary.  Relevant past medical, surgical, family and social history reviewed and updated as indicated.  Allergies and medications reviewed and updated. Current Outpatient Prescriptions on File Prior to Visit  Medication Sig  . albuterol (PROAIR HFA) 108 (90 BASE) MCG/ACT inhaler Inhale 2 puffs into the lungs every 6 (six) hours as needed for wheezing or shortness of breath.   . ALPRAZolam (XANAX) 1 MG tablet GIVE 1 TABLET BY MOUTH EVERY 8 HOURS AS NEEDED FOR ANXIETY  . b complex vitamins tablet Take 1 tablet by mouth daily.   . B-D ULTRAFINE III SHORT PEN 31G X 8 MM MISC USE WITH LANTUS  . buPROPion (WELLBUTRIN XL) 150 MG 24 hr tablet Take 150 mg by mouth daily.  [name] desvenlafaxine (PRISTIQ) 100 MG 24 hr tablet Take 100 mg by mouth daily. Takes with a 50mg  tablet, total daily dose is 150mg   . desvenlafaxine (PRISTIQ) 50 MG 24 hr tablet Take 50 mg by mouth daily. Takes with a 100mg  tablet, total daily dose is 150mg   . Fluticasone-Salmeterol (ADVAIR) 250-50 MCG/DOSE AEPB Inhale 1 puff into the lungs 2 (two) times daily.  . furosemide (LASIX) 40  MG tablet Take 1 tablets (40 mg) in the morning and 1/2 tablet in the afternoon  . guaiFENesin (MUCINEX) 600 MG 12 hr tablet Take 600 mg by mouth 2 (two) times daily. 2 tablet by mouth once daily  . insulin glargine (LANTUS) 100 UNIT/ML injection Inject 0.8 mLs (80 Units total) into the skin at bedtime.  . insulin lispro (HUMALOG) 100 UNIT/ML KiwkPen Inject 15-30 Units into the skin daily with supper. SSI  . lactulose, encephalopathy, (CHRONULAC) 10 GM/15ML SOLN Take 30 mLs (20 g total) by mouth daily. constipation  . lisinopril (PRINIVIL,ZESTRIL) 5 MG tablet TAKE 1 TABLET (5 MG TOTAL) BY MOUTH DAILY.  . Multiple Vitamins-Minerals (CENTRUM SILVER PO) Take 1 tablet by mouth daily.   . mupirocin cream (BACTROBAN) 2 % APPLY 1 APPLICATION TOPICALLY 2 (TWO) TIMES DAILY. FOR SKIN LESIONS  . nadolol (CORGARD) 20 MG tablet Take 0.5 tablets (10 mg total) by mouth daily.  . NON FORMULARY 3 liters oxygen 24/7  . ONE TOUCH ULTRA TEST test strip   . ONETOUCH DELICA LANCETS 93X MISC   . oxyCODONE (OXY IR/ROXICODONE) 5 MG immediate release tablet Take one tablet by mouth every 4 hours as needed for pain  . pantoprazole (PROTONIX) 40 MG tablet Take 1 tablet (40 mg total) by mouth 2 (two) times daily.  . polyethylene glycol powder (  GLYCOLAX/MIRALAX) powder Take 255 g by mouth daily.  . promethazine (PHENERGAN) 25 MG tablet Take 1 tablet (25 mg total) by mouth 2 (two) times daily as needed for nausea.  [name] QUEtiapine (SEROQUEL) 400 MG tablet Take 400 mg by mouth at bedtime.  [name] rOPINIRole (REQUIP) 4 MG tablet Take 4 mg by mouth at bedtime.  . Saline (OCEAN NASAL SPRAY NA) Place 1 spray into the nose 3 (three) times daily as needed (congestion).   . simvastatin (ZOCOR) 40 MG tablet Take 1 tablet (40 mg total) by mouth daily.  [name] spironolactone (ALDACTONE) 25 MG tablet TAKE 1/2 TABLET EVERY DAY  . Vitamin D, Ergocalciferol, (DRISDOL) 50000 UNITS CAPS capsule TAKE 1 CAPSULE (50,000 UNITS TOTAL) BY MOUTH EVERY 7 (SEVEN)  DAYS. TAKE ON FRIDAY  . XIFAXAN 550 MG TABS tablet TAKE 1 TABLET (550 MG TOTAL) BY MOUTH DAILY.   No current facility-administered medications on file prior to visit.   Past Medical History  Diagnosis Date  . Chronic airway obstruction, not elsewhere classified   . Unspecified chronic bronchitis   . Unspecified essential hypertension   . Non-obstructive CAD   . Palpitations   . Mild aortic stenosis     a. 03/2014 Valve area (VTI): 2.89 cm^2, Valve area (Vmax): 2.7 cm^2.  . Pure hypercholesterolemia   . Morbid obesity   . Esophageal reflux   . Angiodysplasia of intestine (without mention of hemorrhage)   . Diverticulosis of colon (without mention of hemorrhage)   . Benign neoplasm of colon   . Autoimmune hepatitis   . Cystitis, unspecified   . Osteoarthrosis, unspecified whether generalized or localized, unspecified site   . Osteoporosis, unspecified   . Restless legs syndrome (RLS)   . Major depressive disorder, recurrent episode, severe, specified as with psychotic behavior   . Iron deficiency anemia secondary to blood loss (chronic)     a. frequent PRBC transfusions.  . Coarse tremors     a. arms.  . Carpal tunnel syndrome on both sides   . Palpitations   . Chronic diastolic CHF (congestive heart failure)     a. 03/26/2014 Echo: EF 55-60%, no rwma, mild AS/AI, mod-sev Ca2+ MV annulus, mildly to mod dil LA.  [name] Asthma   . OSA (obstructive sleep apnea)     a. does not use CPAP.  [name] Type II diabetes mellitus   . H/O hiatal hernia   . Cirrhosis     a. dx'd 1990's  . Adenomatous colon polyp   . Midsternal chest pain     a. conservatively managed ->poor candidate for cath/anticoagulation.  [name] Anxiety   . History of pneumonia   . AVM (arteriovenous malformation)   . Portal hypertensive gastropathy   . Falls frequently   . UTI (lower urinary tract infection)   . Hiatal hernia   . GAVE (gastric antral vascular ectasia)   . Nonalcoholic steatohepatitis (NASH)   .  Thrombocytopenia   . Protein calorie malnutrition      Review of Systems Per HPI unless specifically indicated above    Objective:    BP 124/62 mmHg  Pulse 56  Temp(Src) 98.1 F (36.7 C) (Oral)  Wt 230 lb 4 oz (104.441 kg)  Physical Exam  Constitutional: She appears well-developed and well-nourished. No distress.  Morbidly obese in wheelchair, 3L O2 by [place]   HENT:  Mouth/Throat: Oropharynx is clear and moist. No oropharyngeal exudate.  Cardiovascular: Normal rate, regular rhythm and intact distal pulses.   Murmur (3/6 SEM) heard.  Pulmonary/Chest: Effort normal and breath sounds normal. No respiratory distress. She has no wheezes. She has no rales.  Musculoskeletal: She exhibits no edema.  Skin: Skin is warm and dry.  Nursing note and vitals reviewed.  Results for orders placed or performed in visit on 07/12/14  POCT Urinalysis Dipstick  Result Value Ref Range   Color, UA Yellow    Clarity, UA Clear    Glucose, UA Negative    Bilirubin, UA Negative    Ketones, UA Negative    Spec Grav, UA 1.020    Blood, UA Negative    pH, UA 6.0    Protein, UA Negative    Urobilinogen, UA 0.2    Nitrite, UA Positive    Leukocytes, UA Negative       Assessment & Plan:   Problem List Items Addressed This Visit    Severe recurrent major depressive disorder with psychotic features    Continue f/u with psych. Continue pristiq, wellbutrin, seroquel.    Hepatic cirrhosis    Check ammonia level today. Will route to GI.    Relevant Orders      AMMONIA   Autoimmune hepatitis (Chronic)   Relevant Orders      AMMONIA   Abnormal urine - Primary    Endorses darker urine with increased frequency and feeling feverish, endorses this is how her prior UTI started that led to hospitalization. Will check UA today - +nitrates, micro with 3-5 WBC/hpf. Will treat for presumptive UTI with course of bactrim x5d, while we send UCx.    Relevant Orders      Urine culture      POCT Urinalysis Dipstick  (Completed)       Follow up plan: Return in about 3 months (around 10/12/2014), or as needed, for follow up visit.

## 2014-07-12 NOTE — Addendum Note (Signed)
Addended by: Daralene Milch C on: 07/12/2014 03:30 PM   Modules accepted: Orders

## 2014-07-12 NOTE — Assessment & Plan Note (Addendum)
Endorses darker urine with increased frequency and feeling feverish, endorses this is how her prior UTI started that led to hospitalization. Will check UA today - +nitrates, micro with 3-5 WBC/hpf. Will treat for presumptive UTI with course of bactrim x5d, while we send UCx.

## 2014-07-12 NOTE — Patient Instructions (Addendum)
Let's check urine today and ammonia level for [name]. I want [place] to coordinate better your blood work to minimize amount of blood draws. Return in 3 months for follow up, sooner if needed.

## 2014-07-12 NOTE — Addendum Note (Signed)
Addended by: Ria Bush on: 07/12/2014 05:42 PM   Modules accepted: Orders

## 2014-07-12 NOTE — Assessment & Plan Note (Addendum)
Continue f/u with psych. Continue pristiq, wellbutrin, seroquel.

## 2014-07-12 NOTE — Progress Notes (Signed)
Pre visit review using our clinic review tool, if applicable. No additional management support is needed unless otherwise documented below in the visit note. 

## 2014-07-12 NOTE — Addendum Note (Signed)
Addended by: Royann Shivers A on: 07/12/2014 02:44 PM   Modules accepted: Orders

## 2014-07-13 ENCOUNTER — Other Ambulatory Visit: Payer: Self-pay | Admitting: *Deleted

## 2014-07-13 ENCOUNTER — Telehealth: Payer: Self-pay | Admitting: *Deleted

## 2014-07-13 DIAGNOSIS — K7460 Unspecified cirrhosis of liver: Secondary | ICD-10-CM

## 2014-07-13 DIAGNOSIS — K7581 Nonalcoholic steatohepatitis (NASH): Principal | ICD-10-CM

## 2014-07-13 LAB — AMMONIA: Ammonia: 106 umol/L — ABNORMAL HIGH (ref 16–53)

## 2014-07-13 NOTE — Telephone Encounter (Signed)
Results of Ammonia level forwarded to [employer], PA-C via [facility]. Lab in [facility].

## 2014-07-13 NOTE — Telephone Encounter (Signed)
-----   Message from Vita Barley Hvozdovic, PA-C sent at 07/12/2014  2:55 PM EST ----- Rollene Fare, pt had hepatic  Function test done last week and was waiting on ammonia. Pt saw pcp today and he cancelled our orders and reordered  ammionia and says it will be forwarded to [place]. Can you keep an eye open for this? He LFTS were mildly elevated and shouold be repeated in 2 weeks.

## 2014-07-14 ENCOUNTER — Other Ambulatory Visit (HOSPITAL_COMMUNITY): Payer: Self-pay | Admitting: *Deleted

## 2014-07-14 ENCOUNTER — Encounter: Payer: Self-pay | Admitting: Family Medicine

## 2014-07-14 ENCOUNTER — Other Ambulatory Visit: Payer: Self-pay

## 2014-07-14 DIAGNOSIS — K769 Liver disease, unspecified: Secondary | ICD-10-CM

## 2014-07-14 MED ORDER — LACTULOSE ENCEPHALOPATHY 10 GM/15ML PO SOLN: 20.0000 g | Freq: Two times a day (BID) | ORAL | Status: DC

## 2014-07-15 ENCOUNTER — Encounter (HOSPITAL_COMMUNITY)
Admission: RE | Admit: 2014-07-15 | Discharge: 2014-07-15 | Disposition: A | Discharge status: Home / Self Care | Payer: Medicare Other | Source: Ambulatory Visit | Attending: Family Medicine | Admitting: Family Medicine

## 2014-07-15 DIAGNOSIS — D649 Anemia, unspecified: Secondary | ICD-10-CM | POA: Diagnosis not present

## 2014-07-15 LAB — URINE CULTURE: Colony Count: >=100000

## 2014-07-15 LAB — POCT HEMOGLOBIN-HEMACUE: Hemoglobin: 9.1 g/dL — ABNORMAL LOW (ref 12.0–15.0)

## 2014-07-15 MED ORDER — DARBEPOETIN ALFA 200 MCG/0.4ML IJ SOSY
PREFILLED_SYRINGE | INTRAMUSCULAR | Status: AC
  Filled 2014-07-15: qty 0.4

## 2014-07-15 MED ORDER — DARBEPOETIN ALFA 200 MCG/0.4ML IJ SOSY
200.0000 ug | PREFILLED_SYRINGE | INTRAMUSCULAR | Status: DC
  Administered 2014-07-15: 200 ug via SUBCUTANEOUS

## 2014-07-20 ENCOUNTER — Other Ambulatory Visit: Payer: Self-pay

## 2014-07-20 NOTE — Telephone Encounter (Signed)
Pt called back and pt would like to pick up rx on 07/22/14.

## 2014-07-20 NOTE — Telephone Encounter (Signed)
Pt left v/m requesting rx for oxycodone. Call when ready for pick up. Unable to reach pt to see if out of med.

## 2014-07-22 ENCOUNTER — Other Ambulatory Visit: Payer: Self-pay | Admitting: Family Medicine

## 2014-07-22 ENCOUNTER — Encounter: Payer: Self-pay | Admitting: Family Medicine

## 2014-07-22 MED ORDER — OXYCODONE HCL 5 MG PO TABS: ORAL_TABLET | ORAL | Status: DC

## 2014-07-22 NOTE — Telephone Encounter (Signed)
Message left advising patient and Rx placed up front for pick up. 

## 2014-07-22 NOTE — Telephone Encounter (Signed)
Ok to refill? Originally prescribed 06/09/14 and refilled 06/30/14.

## 2014-07-22 NOTE — Telephone Encounter (Signed)
Printed and in Kim's box 

## 2014-07-25 ENCOUNTER — Telehealth: Payer: Self-pay | Admitting: *Deleted

## 2014-07-25 NOTE — Telephone Encounter (Signed)
Spoke with patient and she will come for labs. 

## 2014-07-25 NOTE — Telephone Encounter (Signed)
-----   Message from Hulan Saas, RN sent at 07/13/2014  1:35 PM EST ----- Call and remind patient due for LFT for [facility] on 07/25/14. Lab in [facility]

## 2014-07-26 ENCOUNTER — Telehealth: Payer: Self-pay | Admitting: Pulmonary Disease

## 2014-07-26 HISTORY — PX: US ECHOCARDIOGRAPHY: HXRAD669

## 2014-07-26 MED ORDER — FLUTICASONE-SALMETEROL 250-50 MCG/DOSE IN AEPB: 1.0000 | INHALATION_SPRAY | Freq: Two times a day (BID) | RESPIRATORY_TRACT | Status: DC

## 2014-07-26 NOTE — Telephone Encounter (Signed)
Called and spoke with pts husband and he is aware of samples that have been left up front and he will come by and pick these up.  Nothing further is needed.

## 2014-07-28 ENCOUNTER — Encounter: Payer: Self-pay | Admitting: Physician Assistant

## 2014-07-28 ENCOUNTER — Other Ambulatory Visit (INDEPENDENT_AMBULATORY_CARE_PROVIDER_SITE_OTHER): Payer: Medicare Other

## 2014-07-28 ENCOUNTER — Ambulatory Visit (INDEPENDENT_AMBULATORY_CARE_PROVIDER_SITE_OTHER): Payer: Medicare Other | Admitting: Physician Assistant

## 2014-07-28 VITALS — BP 142/68 | HR 60 | Ht 64.0 in | Wt 220.0 lb

## 2014-07-28 DIAGNOSIS — K7469 Other cirrhosis of liver: Secondary | ICD-10-CM

## 2014-07-28 DIAGNOSIS — D649 Anemia, unspecified: Secondary | ICD-10-CM | POA: Diagnosis not present

## 2014-07-28 LAB — CBC WITH DIFFERENTIAL/PLATELET
Basophils Absolute: 0 10*3/uL (ref 0.0–0.1)
Basophils Relative: 0.3 % (ref 0.0–3.0)
EOS ABS: 0.2 10*3/uL (ref 0.0–0.7)
Eosinophils Relative: 5.6 % — ABNORMAL HIGH (ref 0.0–5.0)
HCT: 29.2 % — ABNORMAL LOW (ref 36.0–46.0)
HEMOGLOBIN: 9.6 g/dL — AB (ref 12.0–15.0)
Lymphocytes Relative: 9.9 % — ABNORMAL LOW (ref 12.0–46.0)
Lymphs Abs: 0.3 10*3/uL — ABNORMAL LOW (ref 0.7–4.0)
MCHC: 32.9 g/dL (ref 30.0–36.0)
MCV: 99.2 fl (ref 78.0–100.0)
MONO ABS: 0.3 10*3/uL (ref 0.1–1.0)
Monocytes Relative: 9.4 % (ref 3.0–12.0)
NEUTROS ABS: 2.6 10*3/uL (ref 1.4–7.7)
Neutrophils Relative %: 74.8 % (ref 43.0–77.0)
Platelets: 86 10*3/uL — ABNORMAL LOW (ref 150.0–400.0)
RBC: 2.95 Mil/uL — ABNORMAL LOW (ref 3.87–5.11)
RDW: 15.2 % (ref 11.5–15.5)
WBC: 3.4 10*3/uL — ABNORMAL LOW (ref 4.0–10.5)

## 2014-07-28 LAB — FERRITIN: Ferritin: 55.8 ng/mL (ref 10.0–291.0)

## 2014-07-28 LAB — AMMONIA: Ammonia: 74 umol/L — ABNORMAL HIGH (ref 11–35)

## 2014-07-28 NOTE — Progress Notes (Signed)
Patient ID: [name], female   DOB: [birth year], 74 y.o.   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 Since her last visit she states she feels fairly well. She is down 7-1/2 pounds. She has no abdominal pain. She continues to have some lesions on her stomach that are improving with use of mupirocin ointment. She has not had any confusion. She does state that she feels somewhat more fatigued than usual but is continuing to see hematology. She states that 2 weeks ago she had a day of dark stools but her stools have been normal-colored. She is having one formed bowel movements a day and continues to tolerate lactulose. She is using her Xifaxan. She has no further urinary frequency. Past Medical History  Diagnosis Date  . Chronic airway obstruction, not elsewhere classified   . Unspecified chronic bronchitis   . Unspecified essential hypertension   . Non-obstructive CAD   . Palpitations   . Mild aortic stenosis     a. 03/2014 Valve area (VTI): 2.89 cm^2, Valve area (Vmax): 2.7 cm^2.  . Pure hypercholesterolemia   . Morbid obesity   . Esophageal reflux   . 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 Diverticulosis of colon (without mention of hemorrhage)   . Benign neoplasm of colon   . Autoimmune hepatitis   . Cystitis, unspecified   . Osteoarthrosis, unspecified whether generalized or localized, unspecified site   . Osteoporosis, unspecified   . Restless legs syndrome (RLS)   . Major depressive disorder, recurrent episode, severe, specified as with psychotic behavior   . Iron deficiency anemia secondary to blood loss (chronic)     a. frequent PRBC transfusions.  . Coarse tremors     a. arms.  . Carpal tunnel syndrome  on both sides   . Palpitations   . Chronic diastolic CHF (congestive heart failure)     a. 03/26/2014 Echo: EF 55-60%, no rwma, mild AS/AI, mod-sev Ca2+ MV annulus, mildly to mod dil LA.  [name] Asthma   . OSA (obstructive sleep apnea)     a. does not use CPAP.  [name] Type II diabetes mellitus   . H/O hiatal hernia   . Cirrhosis     a. dx'd 1990's  . Adenomatous colon polyp   . Midsternal chest pain     a. conservatively managed ->poor candidate for cath/anticoagulation.  [name] Anxiety   . History of pneumonia   . AVM (arteriovenous malformation)   . Portal hypertensive gastropathy   . Falls frequently     completed HHPT/OT 06/2014, PT unmet goals  . UTI (lower urinary tract infection)   . Hiatal hernia   . GAVE (gastric antral vascular ectasia)   . Nonalcoholic steatohepatitis (NASH)   . Thrombocytopenia   . Protein calorie malnutrition     Past Surgical History  Procedure Laterality Date  . Hemorroidectomy    . Appendectomy    . Vaginal hysterectomy    . Breast biopsy      bilateral  . Cesarean section      x 3  . Appendectomy    . Esophagogastroduodenoscopy  06/12/2012    Procedure: ESOPHAGOGASTRODUODENOSCOPY (EGD);  Surgeon: Lafayette Dragon, MD;  Location: [facility];  Service: Endoscopy;  Laterality: N/A;  . Balloon dilation  06/12/2012    Procedure: BALLOON DILATION;  Surgeon: Lafayette Dragon, MD;  Location: [facility];  Service: Endoscopy;  Laterality: N/A;  ?balloon  . Esophagogastroduodenoscopy  09/10/2012    Procedure: ESOPHAGOGASTRODUODENOSCOPY (EGD);  Surgeon: Lafayette Dragon, MD;  Location: [facility];  Service: Endoscopy;  Laterality: N/A;  . Hot hemostasis  09/10/2012    Procedure: HOT HEMOSTASIS (ARGON PLASMA COAGULATION/BICAP);  Surgeon: Lafayette Dragon, MD;  Location: [facility];  Service: Endoscopy;  Laterality: N/A;  . Esophagogastroduodenoscopy N/A 01/18/2013    Procedure: ESOPHAGOGASTRODUODENOSCOPY (EGD);  Surgeon: Lafayette Dragon, MD;  Location: [facility]COPY;   Service: Endoscopy;  Laterality: N/A;  . Esophagogastroduodenoscopy N/A 05/24/2013    Procedure: ESOPHAGOGASTRODUODENOSCOPY (EGD);  Surgeon: Jerene Bears, MD;  Location: [place];  Service: Gastroenterology;  Laterality: N/A;  . Hot hemostasis N/A 05/24/2013    Procedure: HOT HEMOSTASIS (ARGON PLASMA COAGULATION/BICAP);  Surgeon: Jerene Bears, MD;  Location: [place];  Service: Gastroenterology;  Laterality: N/A;   Family History  Problem Relation Age of Onset  . Heart disease Mother   . Cervical cancer Mother   . Kidney disease Mother   . Diabetes Mother   . Breast cancer Sister   . Multiple sclerosis Sister   . Colon cancer Neg Hx   . Breast cancer Maternal Aunt     x 2  . Diabetes Father   . Diabetes Sister   . Multiple sclerosis Other    History  Substance Use Topics  . Smoking status: Former Smoker -- 1.50 packs/day for 25 years    Types: Cigarettes    Quit date: 08/26/1992  . Smokeless tobacco: Never Used  . Alcohol Use: No     Comment: 8/91/69 "last alcoholic drink was years ago"   Current Outpatient Prescriptions  Medication Sig Dispense Refill  . albuterol (PROAIR HFA) 108 (90 BASE) MCG/ACT inhaler Inhale 2 puffs into the lungs every 6 (six) hours as needed for wheezing or shortness of breath.     . ALPRAZolam (XANAX) 1 MG tablet GIVE 1 TABLET BY MOUTH EVERY 8 HOURS AS NEEDED FOR ANXIETY 30 tablet 0  . b complex vitamins tablet Take 1 tablet by mouth daily.     . B-D ULTRAFINE III SHORT PEN 31G X 8 MM MISC USE WITH LANTUS 30 each 0  . buPROPion (WELLBUTRIN XL) 150 MG 24 hr tablet Take 150 mg by mouth daily.    [name] desvenlafaxine (PRISTIQ) 100 MG 24 hr tablet Take 100 mg by mouth daily. Takes with a 50mg  tablet, total daily dose is 150mg     . desvenlafaxine (PRISTIQ) 50 MG 24 hr tablet Take 50 mg by mouth daily. Takes with a 100mg  tablet, total daily dose is 150mg     . Fluticasone-Salmeterol (ADVAIR) 250-50 MCG/DOSE AEPB Inhale 1 puff into the lungs 2 (two) times  daily. 2 each 0  . furosemide (LASIX) 40 MG tablet Take 1 tablets (40 mg) in the morning and 1/2 tablet in the afternoon    . guaiFENesin (MUCINEX) 600 MG 12 hr tablet Take 600 mg by mouth 2 (two) times daily. 2 tablet by mouth once daily    . insulin glargine (LANTUS) 100 UNIT/ML injection Inject 0.8 mLs (80 Units total) into the skin at bedtime. 10 mL 11  . insulin lispro (HUMALOG) 100 UNIT/ML KiwkPen Inject 15-30 Units into the skin daily with supper. SSI    . lactulose, encephalopathy, (CHRONULAC) 10 GM/15ML SOLN Take 30 mLs (  20 g total) by mouth 2 (two) times daily. constipation 2700 mL 6  . levalbuterol (XOPENEX) 0.63 MG/3ML nebulizer solution   5  . lisinopril (PRINIVIL,ZESTRIL) 5 MG tablet TAKE 1 TABLET (5 MG TOTAL) BY MOUTH DAILY. 30 tablet 6  . Multiple Vitamins-Minerals (CENTRUM SILVER PO) Take 1 tablet by mouth daily.     . mupirocin cream (BACTROBAN) 2 % APPLY 1 APPLICATION TOPICALLY 2 (TWO) TIMES DAILY. FOR SKIN LESIONS 30 g 0  . nadolol (CORGARD) 20 MG tablet Take 0.5 tablets (10 mg total) by mouth daily. 15 tablet 2  . NON FORMULARY 3 liters oxygen 24/7    . ONE TOUCH ULTRA TEST test strip   12  . ONETOUCH DELICA LANCETS 14H MISC   10  . oxyCODONE (OXY IR/ROXICODONE) 5 MG immediate release tablet Take one tablet by mouth every 4 hours as needed for pain 180 tablet 0  . pantoprazole (PROTONIX) 40 MG tablet Take 1 tablet (40 mg total) by mouth 2 (two) times daily. 60 tablet 6  . polyethylene glycol powder (GLYCOLAX/MIRALAX) powder Take 255 g by mouth daily. 255 g 3  . promethazine (PHENERGAN) 25 MG tablet Take 1 tablet (25 mg total) by mouth 2 (two) times daily as needed for nausea. 30 tablet 3  . QUEtiapine (SEROQUEL) 400 MG tablet Take 400 mg by mouth at bedtime.    [name] rOPINIRole (REQUIP) 4 MG tablet Take 4 mg by mouth at bedtime.    . Saline (OCEAN NASAL SPRAY NA) Place 1 spray into the nose 3 (three) times daily as needed (congestion).     . simvastatin (ZOCOR) 40 MG tablet Take  1 tablet (40 mg total) by mouth daily. 90 tablet 1  . spironolactone (ALDACTONE) 25 MG tablet TAKE 1/2 TABLET EVERY DAY 15 tablet 1  . Vitamin D, Ergocalciferol, (DRISDOL) 50000 UNITS CAPS capsule TAKE 1 CAPSULE (50,000 UNITS TOTAL) BY MOUTH EVERY 7 (SEVEN) DAYS. TAKE ON FRIDAY 5 capsule 3  . XIFAXAN 550 MG TABS tablet TAKE 1 TABLET (550 MG TOTAL) BY MOUTH DAILY. 30 tablet 2   No current facility-administered medications for this visit.   Allergies  Allergen Reactions  . Aspirin Other (See Comments)    Causes nosebleeds  . Penicillins Itching  . Theophylline Nausea And Vomiting      Review of Systems: Gen: Denies any fever, chills, sweats, anorexia, weakness, malaise, weight loss, and sleep disorder CV: Denies chest pain, angina, palpitations, syncope, orthopnea, PND, peripheral edema, and claudication. Resp: Denies dyspnea at rest, dyspnea with exercise, cough, sputum, wheezing, coughing up blood, and pleurisy. GI: Denies vomiting blood, jaundice, and fecal incontinence.   Denies dysphagia or odynophagia. GU : Denies urinary burning, blood in urine, urinary frequency, urinary hesitancy, nocturnal urination, and urinary incontinence. MS: Denies joint pain, limitation of movement, and swelling, stiffness, low back pain, extremity pain. Denies muscle weakness, cramps, atrophy.  Derm: Denies rash, itching, dry skin, hives, moles, warts, or unhealing ulcers.  Psych: Denies depression, anxiety, memory loss, suicidal ideation, hallucinations, paranoia, and confusion. Heme: Denies bruising, bleeding, and enlarged lymph nodes. Neuro:  Denies any headaches, dizziness, paresthesia Endo:  Denies any problems with DM, thyroid, adrenal   Physical Exam: General: Pleasant, well developed ,female in no acute distress. In wheelchair, nasal cannula with oxygen Head: Normocephalic and atraumatic Eyes:  sclerae anicteric, conjunctiva pink  Ears: Normal auditory acuity Lungs: Clear throughout to  auscultation Heart: Regular rate and rhythm, 3/6 SEM Abdomen: Soft, non distended, non-tender. No masses, no hepatomegaly.  Normal bowel sounds Musculoskeletal: Symmetrical with no gross deformities  Extremities: No edema  Neurological: Alert oriented x 4, grossly nonfocal, no asterixis Psychological:  Alert and cooperative. Normal mood and affect  Assessment and Recommendations: 73 year old female with advanced nonalcohol related cirrhosis, and portal hypertension here for follow-up she will continue her current medication regimen. A CBC and ammonia level will be obtained. She has a previous order for hepatic function panel at the lab are ready. She will follow-up in 2 months sooner as needed.    Vedh Ptacek, Vita Barley PA-C 07/28/2014,

## 2014-07-28 NOTE — Patient Instructions (Signed)
Your physician has requested that you go to the basement for lab work before leaving today  Continue all your current medications  Please follow up with Dr. Olevia Perches on 10/11/2014 at 3:15pm

## 2014-07-28 NOTE — Progress Notes (Signed)
Reviewed.Stable NASH cirrhosis,

## 2014-07-29 ENCOUNTER — Encounter: Payer: Self-pay | Admitting: *Deleted

## 2014-07-29 ENCOUNTER — Encounter (HOSPITAL_COMMUNITY)
Admission: RE | Admit: 2014-07-29 | Discharge: 2014-07-29 | Disposition: A | Discharge status: Home / Self Care | Payer: Medicare Other | Source: Ambulatory Visit | Attending: Pulmonary Disease | Admitting: Pulmonary Disease

## 2014-07-29 DIAGNOSIS — D649 Anemia, unspecified: Secondary | ICD-10-CM | POA: Diagnosis not present

## 2014-07-29 DIAGNOSIS — N289 Disorder of kidney and ureter, unspecified: Secondary | ICD-10-CM | POA: Diagnosis not present

## 2014-07-29 DIAGNOSIS — K7460 Unspecified cirrhosis of liver: Secondary | ICD-10-CM | POA: Diagnosis not present

## 2014-07-29 DIAGNOSIS — Q2733 Arteriovenous malformation of digestive system vessel: Secondary | ICD-10-CM | POA: Insufficient documentation

## 2014-07-29 LAB — POCT HEMOGLOBIN-HEMACUE: Hemoglobin: 8.9 g/dL — ABNORMAL LOW (ref 12.0–15.0)

## 2014-07-29 MED ORDER — DARBEPOETIN ALFA 200 MCG/0.4ML IJ SOSY
200.0000 ug | PREFILLED_SYRINGE | INTRAMUSCULAR | Status: DC
  Administered 2014-07-29: 200 ug via SUBCUTANEOUS

## 2014-07-30 LAB — IBC PANEL
Iron: 102 ug/dL (ref 42–145)
SATURATION RATIOS: 25.2 % (ref 20.0–50.0)
Transferrin: 289.3 mg/dL (ref 212.0–360.0)

## 2014-07-31 ENCOUNTER — Other Ambulatory Visit: Payer: Self-pay | Admitting: Internal Medicine

## 2014-08-01 ENCOUNTER — Encounter: Payer: Self-pay | Admitting: Family Medicine

## 2014-08-01 ENCOUNTER — Other Ambulatory Visit: Payer: Self-pay | Admitting: *Deleted

## 2014-08-01 ENCOUNTER — Ambulatory Visit (INDEPENDENT_AMBULATORY_CARE_PROVIDER_SITE_OTHER): Payer: Medicare Other | Admitting: Family Medicine

## 2014-08-01 ENCOUNTER — Telehealth: Payer: Self-pay

## 2014-08-01 VITALS — BP 122/68 | HR 60 | Temp 98.1°F | Wt 223.0 lb

## 2014-08-01 DIAGNOSIS — K7460 Unspecified cirrhosis of liver: Secondary | ICD-10-CM

## 2014-08-01 DIAGNOSIS — K7581 Nonalcoholic steatohepatitis (NASH): Principal | ICD-10-CM

## 2014-08-01 DIAGNOSIS — R300 Dysuria: Secondary | ICD-10-CM

## 2014-08-01 DIAGNOSIS — N3000 Acute cystitis without hematuria: Secondary | ICD-10-CM

## 2014-08-01 LAB — POCT URINALYSIS DIPSTICK
Bilirubin, UA: NEGATIVE
GLUCOSE UA: NEGATIVE
KETONES UA: NEGATIVE
Nitrite, UA: POSITIVE
Protein, UA: NEGATIVE
RBC UA: NEGATIVE
Spec Grav, UA: 1.015
Urobilinogen, UA: 0.2
pH, UA: 6

## 2014-08-01 MED ORDER — CEPHALEXIN 500 MG PO CAPS: 500.0000 mg | ORAL_CAPSULE | Freq: Two times a day (BID) | ORAL | Status: DC

## 2014-08-01 NOTE — Telephone Encounter (Signed)
Could schedule appt at 11:30 today. Thanks.

## 2014-08-01 NOTE — Addendum Note (Signed)
Addended by: Ria Bush on: 08/01/2014 12:16 PM   Modules accepted: Miquel Dunn

## 2014-08-01 NOTE — Patient Instructions (Signed)
Urine looks again infected - treat with 1 week course of keflex antibiotic. Please let us know if not improved 1-2 days after treatment. Make sure you stay well hydrated with water. Let us know sooner if fever >101 or worsening symptoms despite treatment.  Urinary Tract Infection Urinary tract infections (UTIs) can develop anywhere along your urinary tract. Your urinary tract is your body's drainage system for removing wastes and extra water. Your urinary tract includes two kidneys, two ureters, a bladder, and a urethra. Your kidneys are a pair of bean-shaped organs. Each kidney is about the size of your fist. They are located below your ribs, one on each side of your spine. CAUSES Infections are caused by microbes, which are microscopic organisms, including fungi, viruses, and bacteria. These organisms are so small that they can only be seen through a microscope. Bacteria are the microbes that most commonly cause UTIs. SYMPTOMS  Symptoms of UTIs may vary by age and gender of the patient and by the location of the infection. Symptoms in young women typically include a frequent and intense urge to urinate and a painful, burning feeling in the bladder or urethra during urination. Older women and men are more likely to be tired, shaky, and weak and have muscle aches and abdominal pain. A fever may mean the infection is in your kidneys. Other symptoms of a kidney infection include pain in your back or sides below the ribs, nausea, and vomiting. DIAGNOSIS To diagnose a UTI, your caregiver will ask you about your symptoms. Your caregiver also will ask to provide a urine sample. The urine sample will be tested for bacteria and white blood cells. White blood cells are made by your body to help fight infection. TREATMENT  Typically, UTIs can be treated with medication. Because most UTIs are caused by a bacterial infection, they usually can be treated with the use of antibiotics. The choice of antibiotic and  length of treatment depend on your symptoms and the type of bacteria causing your infection. HOME CARE INSTRUCTIONS  If you were prescribed antibiotics, take them exactly as your caregiver instructs you. Finish the medication even if you feel better after you have only taken some of the medication.  Drink enough water and fluids to keep your urine clear or pale yellow.  Avoid caffeine, tea, and carbonated beverages. They tend to irritate your bladder.  Empty your bladder often. Avoid holding urine for long periods of time.  Empty your bladder before and after sexual intercourse.  After a bowel movement, women should cleanse from front to back. Use each tissue only once. SEEK MEDICAL CARE IF:   You have back pain.  You develop a fever.  Your symptoms do not begin to resolve within 3 days. SEEK IMMEDIATE MEDICAL CARE IF:   You have severe back pain or lower abdominal pain.  You develop chills.  You have nausea or vomiting.  You have continued burning or discomfort with urination. MAKE SURE YOU:   Understand these instructions.  Will watch your condition.  Will get help right away if you are not doing well or get worse. Document Released: 05/22/2005 Document Revised: 02/11/2012 Document Reviewed: 09/20/2011 [facility] Patient Information 2015 [place], [place]. This information is not intended to replace advice given to you by your health care provider. Make sure you discuss any questions you have with your health care provider.

## 2014-08-01 NOTE — Progress Notes (Signed)
BP 122/68 mmHg  Pulse 60  Temp(Src) 98.1 F (36.7 C) (Oral)  Wt 223 lb (101.152 kg)   CC: UTI?  Subjective:    Patient ID: [name], female    DOB: 05-Jun-1941, 73 y.o.   MRN: [id number]  HPI: [name] is a 73 y.o. female presenting on 08/01/2014 for Dysuria   Endorses sxs since last visit 07/12/2014, at that time hot urine sxs with positive UCx (see below) treated with bactrim 5d course. Never fully resolved. Dysuria started this morning. increased frequency, urgency, incomplete emptying. Some back pain which is chronic in nature but may be worsening. + nausea which is also chronic in nature.  No fevers/chills, hematuria, vomiting.   Last visit UTI grew >100k Ecoli resistant to ampicillin, cipro, levo, sens keflex and bactrim and nitro. Lab Results  Component Value Date   CREATININE 1.1 06/09/2014  pt has tolerated keflex well in past.  Relevant past medical, surgical, family and social history reviewed and updated as indicated. Interim medical history since our last visit reviewed. Allergies and medications reviewed and updated.  Current Outpatient Prescriptions on File Prior to Visit  Medication Sig  . albuterol (PROAIR HFA) 108 (90 BASE) MCG/ACT inhaler Inhale 2 puffs into the lungs every 6 (six) hours as needed for wheezing or shortness of breath.   . ALPRAZolam (XANAX) 1 MG tablet GIVE 1 TABLET BY MOUTH EVERY 8 HOURS AS NEEDED FOR ANXIETY  . b complex vitamins tablet Take 1 tablet by mouth daily.   . B-D ULTRAFINE III SHORT PEN 31G X 8 MM MISC USE WITH LANTUS  . buPROPion (WELLBUTRIN XL) 150 MG 24 hr tablet Take 150 mg by mouth daily.  [name] desvenlafaxine (PRISTIQ) 100 MG 24 hr tablet Take 100 mg by mouth daily. Takes with a 50mg  tablet, total daily dose is 150mg   . desvenlafaxine (PRISTIQ) 50 MG 24 hr tablet Take 50 mg by mouth daily. Takes with a 100mg  tablet, total daily dose is 150mg   . Fluticasone-Salmeterol (ADVAIR) 250-50 MCG/DOSE AEPB Inhale 1 puff into  the lungs 2 (two) times daily.  . furosemide (LASIX) 40 MG tablet Take 1 tablets (40 mg) in the morning and 1/2 tablet in the afternoon  . guaiFENesin (MUCINEX) 600 MG 12 hr tablet Take 600 mg by mouth 2 (two) times daily. 2 tablet by mouth once daily  . insulin glargine (LANTUS) 100 UNIT/ML injection Inject 0.8 mLs (80 Units total) into the skin at bedtime.  . insulin lispro (HUMALOG) 100 UNIT/ML KiwkPen Inject 15-30 Units into the skin daily with supper. SSI  . lactulose, encephalopathy, (CHRONULAC) 10 GM/15ML SOLN Take 30 mLs (20 g total) by mouth 2 (two) times daily. constipation  . levalbuterol (XOPENEX) 0.63 MG/3ML nebulizer solution   . lisinopril (PRINIVIL,ZESTRIL) 5 MG tablet TAKE 1 TABLET (5 MG TOTAL) BY MOUTH DAILY.  . Multiple Vitamins-Minerals (CENTRUM SILVER PO) Take 1 tablet by mouth daily.   . mupirocin cream (BACTROBAN) 2 % APPLY 1 APPLICATION TOPICALLY 2 (TWO) TIMES DAILY. FOR SKIN LESIONS  . nadolol (CORGARD) 20 MG tablet Take 0.5 tablets (10 mg total) by mouth daily.  . NON FORMULARY 3 liters oxygen 24/7  . ONE TOUCH ULTRA TEST test strip   . ONETOUCH DELICA LANCETS 43X MISC   . oxyCODONE (OXY IR/ROXICODONE) 5 MG immediate release tablet Take one tablet by mouth every 4 hours as needed for pain  . pantoprazole (PROTONIX) 40 MG tablet Take 1 tablet (40 mg total) by mouth 2 (two)  times daily.  . polyethylene glycol powder (GLYCOLAX/MIRALAX) powder Take 255 g by mouth daily.  . promethazine (PHENERGAN) 25 MG tablet Take 1 tablet (25 mg total) by mouth 2 (two) times daily as needed for nausea.  [name] QUEtiapine (SEROQUEL) 400 MG tablet Take 400 mg by mouth at bedtime.  [name] rOPINIRole (REQUIP) 4 MG tablet TAKE 1 TABLET BY MOUTH AT BEDTIME  . Saline (OCEAN NASAL SPRAY NA) Place 1 spray into the nose 3 (three) times daily as needed (congestion).   . simvastatin (ZOCOR) 40 MG tablet Take 1 tablet (40 mg total) by mouth daily.  [name] spironolactone (ALDACTONE) 25 MG tablet TAKE 1/2 TABLET  EVERY DAY  . Vitamin D, Ergocalciferol, (DRISDOL) 50000 UNITS CAPS capsule TAKE 1 CAPSULE (50,000 UNITS TOTAL) BY MOUTH EVERY 7 (SEVEN) DAYS. TAKE ON FRIDAY  . XIFAXAN 550 MG TABS tablet TAKE 1 TABLET (550 MG TOTAL) BY MOUTH DAILY.   No current facility-administered medications on file prior to visit.    Review of Systems Per HPI unless specifically indicated above     Objective:    BP 122/68 mmHg  Pulse 60  Temp(Src) 98.1 F (36.7 C) (Oral)  Wt 223 lb (101.152 kg)  Wt Readings from Last 3 Encounters:  08/01/14 223 lb (101.152 kg)  07/28/14 220 lb (99.791 kg)  07/12/14 230 lb 4 oz (104.441 kg)    Physical Exam  Constitutional: She appears well-developed and well-nourished. No distress.  HENT:  Mouth/Throat: No oropharyngeal exudate.  Dry mm  Abdominal: Soft. Normal appearance and bowel sounds are normal. She exhibits no distension and no mass. There is no hepatosplenomegaly. There is generalized tenderness (mild ). There is CVA tenderness (mild bilateral). There is no rigidity, no rebound, no guarding and negative Murphy's sign.  Musculoskeletal: She exhibits no edema.  Psychiatric: She has a normal mood and affect.  Nursing note and vitals reviewed.  Results for orders placed or performed in visit on 08/01/14  POCT Urinalysis Dipstick  Result Value Ref Range   Color, UA Yellow    Clarity, UA Hazy    Glucose, UA Negative    Bilirubin, UA Negative    Ketones, UA Negative    Spec Grav, UA 1.015    Blood, UA Negative    pH, UA 6.0    Protein, UA Negative    Urobilinogen, UA 0.2    Nitrite, UA Positive    Leukocytes, UA moderate (2+)       Assessment & Plan:   Problem List Items Addressed This Visit    Acute cystitis without hematuria - Primary    UA/micro consistent with recurrent UTI - unsure if fully treated with 5d bactrim course although bact sensitive at that time. UCx sent again today. Treat with 7d keflex course. I asked patient to update [place] if any  recurrent sxs or nor fully resolved with this treatment. Encouraged push water intake as well.    Relevant Orders      Urine culture    Other Visit Diagnoses    Dysuria        Relevant Orders       POCT Urinalysis Dipstick (Completed)        Follow up plan: Return if symptoms worsen or fail to improve.

## 2014-08-01 NOTE — Assessment & Plan Note (Signed)
UA/micro consistent with recurrent UTI - unsure if fully treated with 5d bactrim course although bact sensitive at that time. UCx sent again today. Treat with 7d keflex course. I asked patient to update [place] if any recurrent sxs or nor fully resolved with this treatment. Encouraged push water intake as well.

## 2014-08-01 NOTE — Telephone Encounter (Signed)
Scheduled for 11/30

## 2014-08-01 NOTE — Telephone Encounter (Signed)
Pt left v/m; pt has pain upon urination and lower back pain that started on 07/29/14; no frequency of urine, no fever. No available appts at [facility].Please advise. Pt request cb. [facility].

## 2014-08-01 NOTE — Progress Notes (Signed)
Pre visit review using our clinic review tool, if applicable. No additional management support is needed unless otherwise documented below in the visit note. 

## 2014-08-04 ENCOUNTER — Other Ambulatory Visit: Payer: Self-pay | Admitting: Cardiovascular Disease

## 2014-08-04 ENCOUNTER — Other Ambulatory Visit: Payer: Self-pay | Admitting: Family Medicine

## 2014-08-04 LAB — URINE CULTURE: Colony Count: >=100000

## 2014-08-04 MED ORDER — SULFAMETHOXAZOLE-TRIMETHOPRIM 800-160 MG PO TABS: 1.0000 | ORAL_TABLET | Freq: Two times a day (BID) | ORAL | Status: DC

## 2014-08-06 ENCOUNTER — Other Ambulatory Visit: Payer: Self-pay | Admitting: Internal Medicine

## 2014-08-11 ENCOUNTER — Emergency Department (HOSPITAL_COMMUNITY): Payer: Medicare Other

## 2014-08-11 ENCOUNTER — Encounter (HOSPITAL_COMMUNITY): Payer: Self-pay | Admitting: Emergency Medicine

## 2014-08-11 ENCOUNTER — Inpatient Hospital Stay (HOSPITAL_COMMUNITY)
Admission: EM | Admit: 2014-08-11 | Discharge: 2014-08-16 | DRG: 292 | Disposition: A | Discharge status: Home / Self Care | Payer: Medicare Other | Attending: Internal Medicine | Admitting: Internal Medicine

## 2014-08-11 DIAGNOSIS — J209 Acute bronchitis, unspecified: Secondary | ICD-10-CM | POA: Diagnosis present

## 2014-08-11 DIAGNOSIS — D6959 Other secondary thrombocytopenia: Secondary | ICD-10-CM | POA: Diagnosis present

## 2014-08-11 DIAGNOSIS — K31819 Angiodysplasia of stomach and duodenum without bleeding: Secondary | ICD-10-CM

## 2014-08-11 DIAGNOSIS — N3000 Acute cystitis without hematuria: Secondary | ICD-10-CM

## 2014-08-11 DIAGNOSIS — K5730 Diverticulosis of large intestine without perforation or abscess without bleeding: Secondary | ICD-10-CM | POA: Diagnosis present

## 2014-08-11 DIAGNOSIS — J9611 Chronic respiratory failure with hypoxia: Secondary | ICD-10-CM | POA: Diagnosis present

## 2014-08-11 DIAGNOSIS — R7989 Other specified abnormal findings of blood chemistry: Secondary | ICD-10-CM

## 2014-08-11 DIAGNOSIS — I10 Essential (primary) hypertension: Secondary | ICD-10-CM | POA: Diagnosis present

## 2014-08-11 DIAGNOSIS — E7800 Pure hypercholesterolemia, unspecified: Secondary | ICD-10-CM

## 2014-08-11 DIAGNOSIS — J441 Chronic obstructive pulmonary disease with (acute) exacerbation: Secondary | ICD-10-CM | POA: Diagnosis present

## 2014-08-11 DIAGNOSIS — K7581 Nonalcoholic steatohepatitis (NASH): Secondary | ICD-10-CM | POA: Diagnosis present

## 2014-08-11 DIAGNOSIS — Z794 Long term (current) use of insulin: Secondary | ICD-10-CM

## 2014-08-11 DIAGNOSIS — Z7951 Long term (current) use of inhaled steroids: Secondary | ICD-10-CM | POA: Diagnosis not present

## 2014-08-11 DIAGNOSIS — D638 Anemia in other chronic diseases classified elsewhere: Secondary | ICD-10-CM | POA: Diagnosis present

## 2014-08-11 DIAGNOSIS — F333 Major depressive disorder, recurrent, severe with psychotic symptoms: Secondary | ICD-10-CM

## 2014-08-11 DIAGNOSIS — M1990 Unspecified osteoarthritis, unspecified site: Secondary | ICD-10-CM | POA: Diagnosis present

## 2014-08-11 DIAGNOSIS — E875 Hyperkalemia: Secondary | ICD-10-CM

## 2014-08-11 DIAGNOSIS — I2510 Atherosclerotic heart disease of native coronary artery without angina pectoris: Secondary | ICD-10-CM | POA: Diagnosis present

## 2014-08-11 DIAGNOSIS — Z9119 Patient's noncompliance with other medical treatment and regimen: Secondary | ICD-10-CM | POA: Diagnosis present

## 2014-08-11 DIAGNOSIS — R001 Bradycardia, unspecified: Secondary | ICD-10-CM | POA: Diagnosis present

## 2014-08-11 DIAGNOSIS — Z86010 Personal history of colonic polyps: Secondary | ICD-10-CM

## 2014-08-11 DIAGNOSIS — R002 Palpitations: Secondary | ICD-10-CM

## 2014-08-11 DIAGNOSIS — I5032 Chronic diastolic (congestive) heart failure: Secondary | ICD-10-CM

## 2014-08-11 DIAGNOSIS — J019 Acute sinusitis, unspecified: Secondary | ICD-10-CM | POA: Diagnosis present

## 2014-08-11 DIAGNOSIS — D61818 Other pancytopenia: Secondary | ICD-10-CM

## 2014-08-11 DIAGNOSIS — Z880 Allergy status to penicillin: Secondary | ICD-10-CM

## 2014-08-11 DIAGNOSIS — E119 Type 2 diabetes mellitus without complications: Secondary | ICD-10-CM

## 2014-08-11 DIAGNOSIS — K754 Autoimmune hepatitis: Secondary | ICD-10-CM | POA: Diagnosis present

## 2014-08-11 DIAGNOSIS — K7460 Unspecified cirrhosis of liver: Secondary | ICD-10-CM | POA: Diagnosis present

## 2014-08-11 DIAGNOSIS — I5033 Acute on chronic diastolic (congestive) heart failure: Principal | ICD-10-CM | POA: Diagnosis present

## 2014-08-11 DIAGNOSIS — R0602 Shortness of breath: Secondary | ICD-10-CM

## 2014-08-11 DIAGNOSIS — Z87891 Personal history of nicotine dependence: Secondary | ICD-10-CM | POA: Diagnosis not present

## 2014-08-11 DIAGNOSIS — K219 Gastro-esophageal reflux disease without esophagitis: Secondary | ICD-10-CM | POA: Diagnosis present

## 2014-08-11 DIAGNOSIS — E860 Dehydration: Secondary | ICD-10-CM | POA: Diagnosis present

## 2014-08-11 DIAGNOSIS — I350 Nonrheumatic aortic (valve) stenosis: Secondary | ICD-10-CM | POA: Diagnosis present

## 2014-08-11 DIAGNOSIS — G2581 Restless legs syndrome: Secondary | ICD-10-CM | POA: Diagnosis present

## 2014-08-11 DIAGNOSIS — N179 Acute kidney failure, unspecified: Secondary | ICD-10-CM | POA: Diagnosis present

## 2014-08-11 DIAGNOSIS — N289 Disorder of kidney and ureter, unspecified: Secondary | ICD-10-CM

## 2014-08-11 DIAGNOSIS — K5901 Slow transit constipation: Secondary | ICD-10-CM

## 2014-08-11 DIAGNOSIS — T380X5A Adverse effect of glucocorticoids and synthetic analogues, initial encounter: Secondary | ICD-10-CM | POA: Diagnosis present

## 2014-08-11 DIAGNOSIS — I472 Ventricular tachycardia: Secondary | ICD-10-CM

## 2014-08-11 DIAGNOSIS — Z888 Allergy status to other drugs, medicaments and biological substances status: Secondary | ICD-10-CM

## 2014-08-11 DIAGNOSIS — R296 Repeated falls: Secondary | ICD-10-CM

## 2014-08-11 DIAGNOSIS — E1165 Type 2 diabetes mellitus with hyperglycemia: Secondary | ICD-10-CM | POA: Diagnosis present

## 2014-08-11 DIAGNOSIS — Z79899 Other long term (current) drug therapy: Secondary | ICD-10-CM

## 2014-08-11 DIAGNOSIS — R5381 Other malaise: Secondary | ICD-10-CM

## 2014-08-11 DIAGNOSIS — J45909 Unspecified asthma, uncomplicated: Secondary | ICD-10-CM | POA: Diagnosis present

## 2014-08-11 DIAGNOSIS — F419 Anxiety disorder, unspecified: Secondary | ICD-10-CM | POA: Diagnosis present

## 2014-08-11 DIAGNOSIS — J440 Chronic obstructive pulmonary disease with acute lower respiratory infection: Secondary | ICD-10-CM | POA: Diagnosis present

## 2014-08-11 DIAGNOSIS — R778 Other specified abnormalities of plasma proteins: Secondary | ICD-10-CM

## 2014-08-11 DIAGNOSIS — G4733 Obstructive sleep apnea (adult) (pediatric): Secondary | ICD-10-CM | POA: Diagnosis present

## 2014-08-11 DIAGNOSIS — E669 Obesity, unspecified: Secondary | ICD-10-CM

## 2014-08-11 DIAGNOSIS — K7469 Other cirrhosis of liver: Secondary | ICD-10-CM

## 2014-08-11 DIAGNOSIS — R0789 Other chest pain: Secondary | ICD-10-CM

## 2014-08-11 DIAGNOSIS — I4729 Other ventricular tachycardia: Secondary | ICD-10-CM

## 2014-08-11 DIAGNOSIS — R0600 Dyspnea, unspecified: Secondary | ICD-10-CM

## 2014-08-11 DIAGNOSIS — K5520 Angiodysplasia of colon without hemorrhage: Secondary | ICD-10-CM | POA: Diagnosis present

## 2014-08-11 DIAGNOSIS — J189 Pneumonia, unspecified organism: Secondary | ICD-10-CM

## 2014-08-11 DIAGNOSIS — E46 Unspecified protein-calorie malnutrition: Secondary | ICD-10-CM

## 2014-08-11 DIAGNOSIS — R197 Diarrhea, unspecified: Secondary | ICD-10-CM

## 2014-08-11 LAB — I-STAT ARTERIAL BLOOD GAS, ED
BICARBONATE: 24.6 meq/L — AB (ref 20.0–24.0)
O2 Saturation: 99 %
PCO2 ART: 37.6 mmHg (ref 35.0–45.0)
PO2 ART: 136 mmHg — AB (ref 80.0–100.0)
Patient temperature: 98.1
TCO2: 26 mmol/L (ref 0–100)
pH, Arterial: 7.423 (ref 7.350–7.450)

## 2014-08-11 LAB — CBC WITH DIFFERENTIAL/PLATELET
BASOS PCT: 1 % (ref 0–1)
Basophils Absolute: 0 10*3/uL (ref 0.0–0.1)
Eosinophils Absolute: 0.2 10*3/uL (ref 0.0–0.7)
Eosinophils Relative: 4 % (ref 0–5)
HCT: 28.8 % — ABNORMAL LOW (ref 36.0–46.0)
HEMOGLOBIN: 9.5 g/dL — AB (ref 12.0–15.0)
Lymphocytes Relative: 11 % — ABNORMAL LOW (ref 12–46)
Lymphs Abs: 0.5 10*3/uL — ABNORMAL LOW (ref 0.7–4.0)
MCH: 32.5 pg (ref 26.0–34.0)
MCHC: 33 g/dL (ref 30.0–36.0)
MCV: 98.6 fL (ref 78.0–100.0)
MONOS PCT: 7 % (ref 3–12)
Monocytes Absolute: 0.3 10*3/uL (ref 0.1–1.0)
NEUTROS ABS: 3.4 10*3/uL (ref 1.7–7.7)
Neutrophils Relative %: 77 % (ref 43–77)
Platelets: 91 10*3/uL — ABNORMAL LOW (ref 150–400)
RBC: 2.92 MIL/uL — ABNORMAL LOW (ref 3.87–5.11)
RDW: 15.2 % (ref 11.5–15.5)
WBC: 4.5 10*3/uL (ref 4.0–10.5)

## 2014-08-11 LAB — COMPREHENSIVE METABOLIC PANEL
ALBUMIN: 3.5 g/dL (ref 3.5–5.2)
ALK PHOS: 124 U/L — AB (ref 39–117)
ALT: 29 U/L (ref 0–35)
ANION GAP: 12 (ref 5–15)
AST: 33 U/L (ref 0–37)
BUN: 65 mg/dL — ABNORMAL HIGH (ref 6–23)
CHLORIDE: 97 meq/L (ref 96–112)
CO2: 26 mEq/L (ref 19–32)
Calcium: 9.3 mg/dL (ref 8.4–10.5)
Creatinine, Ser: 2.69 mg/dL — ABNORMAL HIGH (ref 0.50–1.10)
GFR calc Af Amer: 19 mL/min — ABNORMAL LOW (ref >90)
GFR calc non Af Amer: 17 mL/min — ABNORMAL LOW (ref >90)
Glucose, Bld: 107 mg/dL — ABNORMAL HIGH (ref 70–99)
POTASSIUM: 5.5 meq/L — AB (ref 3.7–5.3)
SODIUM: 135 meq/L — AB (ref 137–147)
Total Bilirubin: 0.8 mg/dL (ref 0.3–1.2)
Total Protein: 6.9 g/dL (ref 6.0–8.3)

## 2014-08-11 LAB — URINE MICROSCOPIC-ADD ON

## 2014-08-11 LAB — TROPONIN I: Troponin I: <0.3 ng/mL (ref <0.30)

## 2014-08-11 LAB — I-STAT CG4 LACTIC ACID, ED: Lactic Acid, Venous: 0.63 mmol/L (ref 0.5–2.2)

## 2014-08-11 LAB — URINALYSIS, ROUTINE W REFLEX MICROSCOPIC
BILIRUBIN URINE: NEGATIVE
Glucose, UA: NEGATIVE mg/dL
HGB URINE DIPSTICK: NEGATIVE
KETONES UR: NEGATIVE mg/dL
Nitrite: NEGATIVE
PH: 6.5 (ref 5.0–8.0)
Protein, ur: NEGATIVE mg/dL
Specific Gravity, Urine: 1.01 (ref 1.005–1.030)
Urobilinogen, UA: 1 mg/dL (ref 0.0–1.0)

## 2014-08-11 LAB — GLUCOSE, CAPILLARY: Glucose-Capillary: 87 mg/dL (ref 70–99)

## 2014-08-11 LAB — PRO B NATRIURETIC PEPTIDE: Pro B Natriuretic peptide (BNP): 226.4 pg/mL — ABNORMAL HIGH (ref 0–125)

## 2014-08-11 MED ORDER — ONDANSETRON HCL 4 MG PO TABS: 4.0000 mg | ORAL_TABLET | Freq: Four times a day (QID) | ORAL | Status: DC | PRN

## 2014-08-11 MED ORDER — BUPROPION HCL ER (XL) 150 MG PO TB24
150.0000 mg | ORAL_TABLET | Freq: Every day | ORAL | Status: DC
  Administered 2014-08-12 – 2014-08-16 (×5): 150 mg via ORAL
  Filled 2014-08-11 (×5): qty 1

## 2014-08-11 MED ORDER — VENLAFAXINE HCL ER 150 MG PO CP24
150.0000 mg | ORAL_CAPSULE | Freq: Every day | ORAL | Status: DC
  Filled 2014-08-11 (×3): qty 1

## 2014-08-11 MED ORDER — POLYETHYLENE GLYCOL 3350 17 G PO PACK
17.0000 g | PACK | Freq: Every day | ORAL | Status: DC | PRN
  Administered 2014-08-12: 17 g via ORAL
  Filled 2014-08-11 (×2): qty 1

## 2014-08-11 MED ORDER — QUETIAPINE FUMARATE 400 MG PO TABS
400.0000 mg | ORAL_TABLET | Freq: Every day | ORAL | Status: DC
  Administered 2014-08-12 – 2014-08-15 (×5): 400 mg via ORAL
  Filled 2014-08-11 (×6): qty 1

## 2014-08-11 MED ORDER — SODIUM CHLORIDE 0.9 % IV SOLN
INTRAVENOUS | Status: AC
  Administered 2014-08-12: 01:00:00 via INTRAVENOUS

## 2014-08-11 MED ORDER — MOMETASONE FURO-FORMOTEROL FUM 100-5 MCG/ACT IN AERO
2.0000 | INHALATION_SPRAY | Freq: Two times a day (BID) | RESPIRATORY_TRACT | Status: DC
  Administered 2014-08-12 – 2014-08-16 (×9): 2 via RESPIRATORY_TRACT
  Filled 2014-08-11: qty 8.8

## 2014-08-11 MED ORDER — SODIUM POLYSTYRENE SULFONATE 15 GM/60ML PO SUSP
30.0000 g | Freq: Once | ORAL | Status: AC
  Administered 2014-08-11: 30 g via ORAL
  Filled 2014-08-11: qty 120

## 2014-08-11 MED ORDER — LEVALBUTEROL HCL 0.63 MG/3ML IN NEBU: 0.6300 mg | INHALATION_SOLUTION | Freq: Three times a day (TID) | RESPIRATORY_TRACT | Status: DC | PRN

## 2014-08-11 MED ORDER — VENLAFAXINE HCL ER 75 MG PO CP24
75.0000 mg | ORAL_CAPSULE | Freq: Every day | ORAL | Status: DC
  Filled 2014-08-11 (×3): qty 1

## 2014-08-11 MED ORDER — LACTULOSE 10 GM/15ML PO SOLN
20.0000 g | Freq: Two times a day (BID) | ORAL | Status: DC
  Administered 2014-08-12 (×2): 20 g via ORAL
  Filled 2014-08-11 (×3): qty 30

## 2014-08-11 MED ORDER — OXYCODONE HCL 5 MG PO TABS
5.0000 mg | ORAL_TABLET | ORAL | Status: DC | PRN
  Administered 2014-08-12 – 2014-08-15 (×6): 5 mg via ORAL
  Filled 2014-08-11 (×6): qty 1

## 2014-08-11 MED ORDER — ALBUTEROL SULFATE (2.5 MG/3ML) 0.083% IN NEBU: 3.0000 mL | INHALATION_SOLUTION | Freq: Four times a day (QID) | RESPIRATORY_TRACT | Status: DC | PRN

## 2014-08-11 MED ORDER — SIMVASTATIN 40 MG PO TABS
40.0000 mg | ORAL_TABLET | Freq: Every evening | ORAL | Status: DC
  Administered 2014-08-12 – 2014-08-15 (×4): 40 mg via ORAL
  Filled 2014-08-11 (×5): qty 1

## 2014-08-11 MED ORDER — RIFAXIMIN 550 MG PO TABS
550.0000 mg | ORAL_TABLET | Freq: Two times a day (BID) | ORAL | Status: DC
  Administered 2014-08-12 – 2014-08-16 (×10): 550 mg via ORAL
  Filled 2014-08-11 (×13): qty 1

## 2014-08-11 MED ORDER — GUAIFENESIN ER 600 MG PO TB12
600.0000 mg | ORAL_TABLET | Freq: Two times a day (BID) | ORAL | Status: DC
  Administered 2014-08-12 – 2014-08-16 (×10): 600 mg via ORAL
  Filled 2014-08-11 (×11): qty 1

## 2014-08-11 MED ORDER — ROPINIROLE HCL 1 MG PO TABS
4.0000 mg | ORAL_TABLET | Freq: Every day | ORAL | Status: DC
  Administered 2014-08-12 – 2014-08-15 (×5): 4 mg via ORAL
  Filled 2014-08-11 (×6): qty 4

## 2014-08-11 MED ORDER — INSULIN ASPART 100 UNIT/ML ~~LOC~~ SOLN
0.0000 [IU] | Freq: Three times a day (TID) | SUBCUTANEOUS | Status: DC
  Administered 2014-08-12: 3 [IU] via SUBCUTANEOUS
  Administered 2014-08-12 (×2): 2 [IU] via SUBCUTANEOUS
  Administered 2014-08-13: 3 [IU] via SUBCUTANEOUS
  Administered 2014-08-13: 1 [IU] via SUBCUTANEOUS

## 2014-08-11 MED ORDER — PANTOPRAZOLE SODIUM 40 MG PO TBEC
40.0000 mg | DELAYED_RELEASE_TABLET | Freq: Two times a day (BID) | ORAL | Status: DC
  Administered 2014-08-12 – 2014-08-16 (×10): 40 mg via ORAL
  Filled 2014-08-11 (×7): qty 1

## 2014-08-11 MED ORDER — INSULIN GLARGINE 100 UNIT/ML ~~LOC~~ SOLN
80.0000 [IU] | Freq: Every day | SUBCUTANEOUS | Status: DC
  Administered 2014-08-13 – 2014-08-15 (×3): 80 [IU] via SUBCUTANEOUS
  Filled 2014-08-11 (×6): qty 0.8

## 2014-08-11 MED ORDER — ONDANSETRON HCL 4 MG/2ML IJ SOLN
4.0000 mg | Freq: Four times a day (QID) | INTRAMUSCULAR | Status: DC | PRN
  Administered 2014-08-13: 4 mg via INTRAVENOUS
  Filled 2014-08-11: qty 2

## 2014-08-11 MED ORDER — SODIUM CHLORIDE 0.9 % IJ SOLN
3.0000 mL | Freq: Two times a day (BID) | INTRAMUSCULAR | Status: DC
  Administered 2014-08-12 – 2014-08-16 (×9): 3 mL via INTRAVENOUS

## 2014-08-11 MED ORDER — ALPRAZOLAM 0.5 MG PO TABS
1.0000 mg | ORAL_TABLET | Freq: Three times a day (TID) | ORAL | Status: DC | PRN
  Administered 2014-08-12 – 2014-08-16 (×9): 1 mg via ORAL
  Filled 2014-08-11 (×9): qty 2

## 2014-08-11 NOTE — ED Notes (Signed)
Patient transported to X-ray 

## 2014-08-11 NOTE — ED Provider Notes (Signed)
CSN: [id number]     Arrival date & time 08/11/14  1726 History   First MD Initiated Contact with Patient 08/11/14 1730     Chief Complaint  Patient presents with  . Shortness of Breath     (Consider location/radiation/quality/duration/timing/severity/associated sxs/prior Treatment) HPI Comments: Patient presents to the ER for evaluation of difficulty breathing. Patient reports that she had chest pain earlier today when she woke up. This did resolve this morning, but she became more short of breath through the course of the day. She reports that she does have a cough and feels "congested". She is feeling some discomfort across both posterior lower lung areas. Patient does have a history of COPD and CHF. She reports that she weighs herself daily and she has not gained any weight. EMS reports that the patient was 81% on 3 L by nasal cannula at home. This is her normal oxygen. She was increased to 6 L and oxygenation significantly improved.  Patient is a 73 y.o. female presenting with shortness of breath.  Shortness of Breath Associated symptoms: chest pain and cough     Past Medical History  Diagnosis Date  . Chronic airway obstruction, not elsewhere classified   . Unspecified chronic bronchitis   . Unspecified essential hypertension   . Non-obstructive CAD   . Palpitations   . Mild aortic stenosis     a. 03/2014 Valve area (VTI): 2.89 cm^2, Valve area (Vmax): 2.7 cm^2.  . Pure hypercholesterolemia   . Morbid obesity   . Esophageal reflux   . 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 Diverticulosis of colon (without mention of hemorrhage)   . Benign neoplasm of colon   . Autoimmune hepatitis   . Cystitis, unspecified   . Osteoarthrosis, unspecified whether generalized or localized, unspecified site   . Osteoporosis, unspecified   . Restless legs syndrome (RLS)   . Major depressive disorder, recurrent episode, severe, specified as with psychotic  behavior   . Iron deficiency anemia secondary to blood loss (chronic)     a. frequent PRBC transfusions.  . Coarse tremors     a. arms.  . Carpal tunnel syndrome on both sides   . Palpitations   . Chronic diastolic CHF (congestive heart failure)     a. 03/26/2014 Echo: EF 55-60%, no rwma, mild AS/AI, mod-sev Ca2+ MV annulus, mildly to mod dil LA.  [name] Asthma   . OSA (obstructive sleep apnea)     a. does not use CPAP.  [name] Type II diabetes mellitus   . H/O hiatal hernia   . Cirrhosis     a. dx'd 1990's  . Adenomatous colon polyp   . Midsternal chest pain     a. conservatively managed ->poor candidate for cath/anticoagulation.  [name] Anxiety   . History of pneumonia   . AVM (arteriovenous malformation)   . Portal hypertensive gastropathy   . Falls frequently     completed HHPT/OT 06/2014, PT unmet goals  . UTI (lower urinary tract infection)   . Hiatal hernia   . GAVE (gastric antral vascular ectasia)   . Nonalcoholic steatohepatitis (NASH)   . Thrombocytopenia   . Protein calorie malnutrition    Past Surgical History  Procedure Laterality Date  . Hemorroidectomy    . Appendectomy    . Vaginal hysterectomy    . Breast biopsy      bilateral  . Cesarean section      x 3  . Appendectomy    .  Esophagogastroduodenoscopy  06/12/2012    Procedure: ESOPHAGOGASTRODUODENOSCOPY (EGD);  Surgeon: Lafayette Dragon, MD;  Location: [facility];  Service: Endoscopy;  Laterality: N/A;  . Balloon dilation  06/12/2012    Procedure: BALLOON DILATION;  Surgeon: Lafayette Dragon, MD;  Location: [facility];  Service: Endoscopy;  Laterality: N/A;  ?balloon  . Esophagogastroduodenoscopy  09/10/2012    Procedure: ESOPHAGOGASTRODUODENOSCOPY (EGD);  Surgeon: Lafayette Dragon, MD;  Location: [facility];  Service: Endoscopy;  Laterality: N/A;  . Hot hemostasis  09/10/2012    Procedure: HOT HEMOSTASIS (ARGON PLASMA COAGULATION/BICAP);  Surgeon: Lafayette Dragon, MD;  Location: [facility];  Service: Endoscopy;   Laterality: N/A;  . Esophagogastroduodenoscopy N/A 01/18/2013    Procedure: ESOPHAGOGASTRODUODENOSCOPY (EGD);  Surgeon: Lafayette Dragon, MD;  Location: [facility]COPY;  Service: Endoscopy;  Laterality: N/A;  . Esophagogastroduodenoscopy N/A 05/24/2013    Procedure: ESOPHAGOGASTRODUODENOSCOPY (EGD);  Surgeon: Jerene Bears, MD;  Location: [place];  Service: Gastroenterology;  Laterality: N/A;  . Hot hemostasis N/A 05/24/2013    Procedure: HOT HEMOSTASIS (ARGON PLASMA COAGULATION/BICAP);  Surgeon: Jerene Bears, MD;  Location: [place];  Service: Gastroenterology;  Laterality: N/A;   Family History  Problem Relation Age of Onset  . Heart disease Mother   . Cervical cancer Mother   . Kidney disease Mother   . Diabetes Mother   . Breast cancer Sister   . Multiple sclerosis Sister   . Colon cancer Neg Hx   . Breast cancer Maternal Aunt     x 2  . Diabetes Father   . Diabetes Sister   . Multiple sclerosis Other    History  Substance Use Topics  . Smoking status: Former Smoker -- 1.50 packs/day for 25 years    Types: Cigarettes    Quit date: 08/26/1992  . Smokeless tobacco: Never Used  . Alcohol Use: No     Comment: 12/03/79 "last alcoholic drink was years ago"   OB History    No data available     Review of Systems  Respiratory: Positive for cough and shortness of breath.   Cardiovascular: Positive for chest pain.  All other systems reviewed and are negative.     Allergies  Aspirin; Penicillins; and Theophylline  Home Medications   Prior to Admission medications   Medication Sig Start Date End Date Taking? Authorizing Provider  albuterol (PROAIR HFA) 108 (90 BASE) MCG/ACT inhaler Inhale 2 puffs into the lungs every 6 (six) hours as needed for wheezing or shortness of breath.     Historical Provider, MD  ALPRAZolam Duanne Moron) 1 MG tablet GIVE 1 TABLET BY MOUTH EVERY 8 HOURS AS NEEDED FOR ANXIETY 05/13/14   Ria Bush, MD  b complex vitamins tablet Take 1 tablet by mouth  daily.     Historical Provider, MD  B-D ULTRAFINE III SHORT PEN 31G X 8 MM MISC USE WITH LANTUS 06/23/14   Renato Shin, MD  buPROPion (WELLBUTRIN XL) 150 MG 24 hr tablet Take 150 mg by mouth daily.    Historical Provider, MD  desvenlafaxine (PRISTIQ) 100 MG 24 hr tablet Take 100 mg by mouth daily. Takes with a 50mg  tablet, total daily dose is 150mg     Historical Provider, MD  desvenlafaxine (PRISTIQ) 50 MG 24 hr tablet Take 50 mg by mouth daily. Takes with a 100mg  tablet, total daily dose is 150mg     Historical Provider, MD  Fluticasone-Salmeterol (ADVAIR) 250-50 MCG/DOSE AEPB Inhale 1 puff into the lungs 2 (two) times daily. 07/26/14  Noralee Space, MD  furosemide (LASIX) 40 MG tablet Take 1 tablets (40 mg) in the morning and 1/2 tablet in the afternoon 03/31/14   Belkys A Regalado, MD  guaiFENesin (MUCINEX) 600 MG 12 hr tablet Take 600 mg by mouth 2 (two) times daily. 2 tablet by mouth once daily    Historical Provider, MD  insulin glargine (LANTUS) 100 UNIT/ML injection Inject 0.8 mLs (80 Units total) into the skin at bedtime. 03/31/14   Belkys A Regalado, MD  insulin lispro (HUMALOG) 100 UNIT/ML KiwkPen Inject 15-30 Units into the skin daily with supper. SSI 07/15/13   Renato Shin, MD  lactulose, encephalopathy, (CHRONULAC) 10 GM/15ML SOLN Take 30 mLs (20 g total) by mouth 2 (two) times daily. constipation 07/14/14   Lori P Hvozdovic, PA-C  levalbuterol (XOPENEX) 0.63 MG/3ML nebulizer solution  06/05/14   Historical Provider, MD  lisinopril (PRINIVIL,ZESTRIL) 5 MG tablet TAKE 1 TABLET (5 MG TOTAL) BY MOUTH DAILY. 07/06/14   Ria Bush, MD  Multiple Vitamins-Minerals (CENTRUM SILVER PO) Take 1 tablet by mouth daily.     Historical Provider, MD  mupirocin cream (BACTROBAN) 2 % APPLY 1 APPLICATION TOPICALLY 2 (TWO) TIMES DAILY. FOR SKIN LESIONS 07/22/14   Ria Bush, MD  nadolol (CORGARD) 20 MG tablet TAKE 1/2 TABLET (10 MG TOTAL) BY MOUTH DAILY. 08/04/14   Blane Ohara, MD  NON  FORMULARY 3 liters oxygen 24/7    Historical Provider, MD  ONE TOUCH ULTRA TEST test strip  06/22/14   Historical Provider, MD  Jonetta Speak LANCETS 63Z [place]  06/22/14   Historical Provider, MD  oxyCODONE (OXY IR/ROXICODONE) 5 MG immediate release tablet Take one tablet by mouth every 4 hours as needed for pain 07/22/14   Ria Bush, MD  pantoprazole (PROTONIX) 40 MG tablet Take 1 tablet (40 mg total) by mouth 2 (two) times daily. 07/07/14   Lori P Hvozdovic, PA-C  polyethylene glycol powder (GLYCOLAX/MIRALAX) powder Take 255 g by mouth daily. 07/07/14   Lori P Hvozdovic, PA-C  promethazine (PHENERGAN) 25 MG tablet Take 1 tablet (25 mg total) by mouth 2 (two) times daily as needed for nausea. 06/09/14   Ria Bush, MD  QUEtiapine (SEROQUEL) 400 MG tablet Take 400 mg by mouth at bedtime.    Historical Provider, MD  rOPINIRole (REQUIP) 4 MG tablet TAKE 1 TABLET BY MOUTH AT BEDTIME 08/01/14   Tiffany L Reed, DO  Saline (OCEAN NASAL SPRAY NA) Place 1 spray into the nose 3 (three) times daily as needed (congestion).     Historical Provider, MD  simvastatin (ZOCOR) 40 MG tablet TAKE 1 TABLET BY MOUTH EVERY EVENING 08/08/14   Tiffany L Reed, DO  spironolactone (ALDACTONE) 25 MG tablet TAKE 1/2 TABLET EVERY DAY 06/29/14   Blane Ohara, MD  sulfamethoxazole-trimethoprim (BACTRIM DS,SEPTRA DS) 800-160 MG per tablet Take 1 tablet by mouth 2 (two) times daily. 08/04/14   Ria Bush, MD  Vitamin D, Ergocalciferol, (DRISDOL) 50000 UNITS CAPS capsule TAKE 1 CAPSULE (50,000 UNITS TOTAL) BY MOUTH EVERY 7 (SEVEN) DAYS. TAKE ON FRIDAY 05/16/14   Ria Bush, MD  XIFAXAN 550 MG TABS tablet TAKE 1 TABLET (550 MG TOTAL) BY MOUTH DAILY. 07/11/14   Lafayette Dragon, MD   There were no vitals taken for this visit. Physical Exam  Constitutional: She is oriented to person, place, and time. She appears well-developed and well-nourished. No distress.  HENT:  Head: Normocephalic and atraumatic.  Right  Ear: Hearing normal.  Left Ear: Hearing normal.  Nose: Nose normal.  Mouth/Throat: Oropharynx is clear and moist and mucous membranes are normal.  Eyes: Conjunctivae and EOM are normal. Pupils are equal, round, and reactive to light.  Neck: Normal range of motion. Neck supple.  Cardiovascular: Regular rhythm, S1 normal and S2 normal.  Exam reveals no gallop and no friction rub.   No murmur heard. Pulmonary/Chest: Effort normal and breath sounds normal. No respiratory distress. She exhibits no tenderness.  Abdominal: Soft. Normal appearance and bowel sounds are normal. There is no hepatosplenomegaly. There is no tenderness. There is no rebound, no guarding, no tenderness at McBurney's point and negative Murphy's sign. No hernia.  Musculoskeletal: Normal range of motion.  Neurological: She is alert and oriented to person, place, and time. She has normal strength. No cranial nerve deficit or sensory deficit. Coordination normal. GCS eye subscore is 4. GCS verbal subscore is 5. GCS motor subscore is 6.  Skin: Skin is warm, dry and intact. No rash noted. No cyanosis.  Psychiatric: She has a normal mood and affect. Her speech is normal and behavior is normal. Thought content normal.  Nursing note and vitals reviewed.   ED Course  Procedures (including critical care time) Labs Review Labs Reviewed - No data to display  Imaging Review No results found.   EKG Interpretation   Date/Time:  Thursday August 11 2014 17:28:21 EST Ventricular Rate:  51 PR Interval:  259 QRS Duration: 118 QT Interval:  467 QTC Calculation: 430 R Axis:   -12 Text Interpretation:  Sinus rhythm Prolonged PR interval Nonspecific  intraventricular conduction delay No significant change since last tracing  Confirmed by Aylan Bayona  MD, [place] [phone]) on 08/11/2014 5:34:59 PM      MDM   Final diagnoses:  None  COPD Bradycardia Hyerkalemia AKI  Patient presents to the ER for evaluation of shortness of  breath. Patient had onset of chest pain earlier today, but after that resolved she developed shortness of breath. She does have history of COPD and CHF. She is on diuretics. She reports no weight gain recently, does not think she has any fluid in her legs or lungs. Patient had profound hypoxia when EMS found her at home. This improved with significant increase of her oxygen, however. Here in the ER she does not have any evidence of ischemia or infarct. Chest x-ray did not show any evidence of pneumonia. Patient is very bradycardic here in the ER, likely secondary to the beta blocker she takes. She also has an acute kidney injury. She has had significant elevation of creatinine since her most recent labs 2 months ago. She has concomitant hyperkalemia.    Orpah Greek, MD 08/11/14 2220

## 2014-08-11 NOTE — ED Notes (Signed)
Attempted report 

## 2014-08-11 NOTE — H&P (Signed)
[facility] History and Physical  [name] DGU:[id number] DOB: 1941/04/22 DOA: 08/11/2014  Referring physician: ER physician. PCP: Ria Bush, MD   Chief Complaint: Shortness of breath.  HPI: [name] is a 73 y.o. 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 Patient states she was been short of breath last 3 days. Denies any associated productive cough but did have occasional diaphoresis. Denies chest pain. Patient's shortness of breath increases with minimal exertion. In the ER patient was initially found to be short of breath but short of breath improved soon without any intervention but patient stated that when she tries to make minimal moment she gets easily short of breath. On exam patient is mildly hypotensive and bradycardic and blood tests show worsening of renal function. Patient states that 3 days ago she also had couple of episodes of nausea vomiting but denies any diarrhea. At this time chest x-ray does not show anything acute. Patient is being admitted for further management of her acute renal failure and shortness of breath.  Review of Systems: As presented in the history of presenting illness, rest negative.  Past Medical History  Diagnosis Date  . Chronic airway obstruction, not elsewhere classified   . Unspecified chronic bronchitis   . Unspecified essential hypertension   . Non-obstructive CAD   . Palpitations   . Mild aortic stenosis     a. 03/2014 Valve area (VTI): 2.89 cm^2, Valve area (Vmax): 2.7 cm^2.  . Pure hypercholesterolemia   . Morbid obesity   . Esophageal reflux   . 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 Diverticulosis of colon (without mention of hemorrhage)   . Benign neoplasm of  colon   . Autoimmune hepatitis   . Cystitis, unspecified   . Osteoarthrosis, unspecified whether generalized or localized, unspecified site   . Osteoporosis, unspecified   . Restless legs syndrome (RLS)   . Major depressive disorder, recurrent episode, severe, specified as with psychotic behavior   . Iron deficiency anemia secondary to blood loss (chronic)     a. frequent PRBC transfusions.  . Coarse tremors     a. arms.  . Carpal tunnel syndrome on both sides   . Palpitations   . Chronic diastolic CHF (congestive heart failure)     a. 03/26/2014 Echo: EF 55-60%, no rwma, mild AS/AI, mod-sev Ca2+ MV annulus, mildly to mod dil LA.  [name] Asthma   . OSA (obstructive sleep apnea)     a. does not use CPAP.  [name] Type II diabetes mellitus   . H/O hiatal hernia   . Cirrhosis     a. dx'd 1990's  . Adenomatous colon polyp   . Midsternal chest pain     a. conservatively managed ->poor candidate for cath/anticoagulation.  [name] Anxiety   . History of pneumonia   . AVM (arteriovenous malformation)   . Portal hypertensive gastropathy   . Falls frequently     completed HHPT/OT 06/2014, PT unmet goals  . UTI (lower urinary tract infection)   . Hiatal hernia   . GAVE (gastric antral vascular ectasia)   . Nonalcoholic steatohepatitis (NASH)   . Thrombocytopenia   . Protein calorie malnutrition    Past Surgical History  Procedure Laterality Date  . Hemorroidectomy    .  Appendectomy    . Vaginal hysterectomy    . Breast biopsy      bilateral  . Cesarean section      x 3  . Appendectomy    . Esophagogastroduodenoscopy  06/12/2012    Procedure: ESOPHAGOGASTRODUODENOSCOPY (EGD);  Surgeon: Lafayette Dragon, MD;  Location: [facility];  Service: Endoscopy;  Laterality: N/A;  . Balloon dilation  06/12/2012    Procedure: BALLOON DILATION;  Surgeon: Lafayette Dragon, MD;  Location: [facility];  Service: Endoscopy;  Laterality: N/A;  ?balloon  . Esophagogastroduodenoscopy  09/10/2012    Procedure:  ESOPHAGOGASTRODUODENOSCOPY (EGD);  Surgeon: Lafayette Dragon, MD;  Location: [facility];  Service: Endoscopy;  Laterality: N/A;  . Hot hemostasis  09/10/2012    Procedure: HOT HEMOSTASIS (ARGON PLASMA COAGULATION/BICAP);  Surgeon: Lafayette Dragon, MD;  Location: [facility];  Service: Endoscopy;  Laterality: N/A;  . Esophagogastroduodenoscopy N/A 01/18/2013    Procedure: ESOPHAGOGASTRODUODENOSCOPY (EGD);  Surgeon: Lafayette Dragon, MD;  Location: [facility];  Service: Endoscopy;  Laterality: N/A;  . Esophagogastroduodenoscopy N/A 05/24/2013    Procedure: ESOPHAGOGASTRODUODENOSCOPY (EGD);  Surgeon: Jerene Bears, MD;  Location: [place];  Service: Gastroenterology;  Laterality: N/A;  . Hot hemostasis N/A 05/24/2013    Procedure: HOT HEMOSTASIS (ARGON PLASMA COAGULATION/BICAP);  Surgeon: Jerene Bears, MD;  Location: [place];  Service: Gastroenterology;  Laterality: N/A;   Social History:  reports that she quit smoking about 21 years ago. Her smoking use included Cigarettes. She has a 37.5 pack-year smoking history. She has never used smokeless tobacco. She reports that she does not drink alcohol or use illicit drugs. Where does patient live  home. Can patient participate in ADLs?  Not sure.  Allergies  Allergen Reactions  . Acetaminophen Other (See Comments)    Liver dysfunction  . Aspirin Other (See Comments)    Causes nosebleeds  . Theophylline Nausea And Vomiting  . Penicillins Itching    Family History:  Family History  Problem Relation Age of Onset  . Heart disease Mother   . Cervical cancer Mother   . Kidney disease Mother   . Diabetes Mother   . Breast cancer Sister   . Multiple sclerosis Sister   . Colon cancer Neg Hx   . Breast cancer Maternal Aunt     x 2  . Diabetes Father   . Diabetes Sister   . Multiple sclerosis Other       Prior to Admission medications   Medication Sig Start Date End Date Taking? Authorizing Provider  albuterol (PROAIR HFA) 108 (90 BASE)  MCG/ACT inhaler Inhale 2 puffs into the lungs every 6 (six) hours as needed for wheezing or shortness of breath.    Yes Historical Provider, MD  ALPRAZolam Duanne Moron) 1 MG tablet GIVE 1 TABLET BY MOUTH EVERY 8 HOURS AS NEEDED FOR ANXIETY 05/13/14  Yes Ria Bush, MD  b complex vitamins tablet Take 1 tablet by mouth daily.    Yes Historical Provider, MD  buPROPion (WELLBUTRIN XL) 150 MG 24 hr tablet Take 150 mg by mouth daily.   Yes Historical Provider, MD  desvenlafaxine (PRISTIQ) 100 MG 24 hr tablet Take 150 mg by mouth daily. Takes with a 50mg  tablet total of 150mg  daily   Yes Historical Provider, MD  desvenlafaxine (PRISTIQ) 50 MG 24 hr tablet Take 50 mg by mouth daily. Takes with a 100mg  tablet, total daily dose is 150mg    Yes Historical Provider, MD  Fluticasone-Salmeterol (ADVAIR) 250-50 MCG/DOSE AEPB Inhale 1  puff into the lungs 2 (two) times daily. 07/26/14  Yes Noralee Space, MD  furosemide (LASIX) 40 MG tablet Take 1 tablets (40 mg) in the morning and 1/2 tablet in the afternoon 03/31/14  Yes Belkys A Regalado, MD  guaiFENesin (MUCINEX) 600 MG 12 hr tablet Take 600 mg by mouth 2 (two) times daily. 2 tablet by mouth once daily   Yes Historical Provider, MD  insulin glargine (LANTUS) 100 UNIT/ML injection Inject 0.8 mLs (80 Units total) into the skin at bedtime. 03/31/14  Yes Belkys A Regalado, MD  insulin lispro (HUMALOG) 100 UNIT/ML KiwkPen Inject 15-30 Units into the skin daily with supper. SSI 07/15/13  Yes Renato Shin, MD  lactulose, encephalopathy, (CHRONULAC) 10 GM/15ML SOLN Take 30 mLs (20 g total) by mouth 2 (two) times daily. constipation 07/14/14  Yes Lori P Hvozdovic, PA-C  levalbuterol (XOPENEX) 0.63 MG/3ML nebulizer solution Take 0.63 mg by nebulization every 8 (eight) hours as needed for shortness of breath.  06/05/14  Yes Historical Provider, MD  lisinopril (PRINIVIL,ZESTRIL) 5 MG tablet TAKE 1 TABLET (5 MG TOTAL) BY MOUTH DAILY. Patient taking differently: Take 5 mg by mouth at  bedtime. TAKE 1 TABLET (5 MG TOTAL) BY MOUTH DAILY. 07/06/14  Yes Ria Bush, MD  Multiple Vitamins-Minerals (CENTRUM SILVER PO) Take 1 tablet by mouth daily.    Yes Historical Provider, MD  mupirocin cream (BACTROBAN) 2 % APPLY 1 APPLICATION TOPICALLY 2 (TWO) TIMES DAILY. FOR SKIN LESIONS 07/22/14  Yes Ria Bush, MD  nadolol (CORGARD) 20 MG tablet TAKE 1/2 TABLET (10 MG TOTAL) BY MOUTH DAILY. 08/04/14  Yes Blane Ohara, MD  NON FORMULARY 3 liters oxygen 24/7   Yes Historical Provider, MD  oxyCODONE (OXY IR/ROXICODONE) 5 MG immediate release tablet Take one tablet by mouth every 4 hours as needed for pain 07/22/14  Yes Ria Bush, MD  pantoprazole (PROTONIX) 40 MG tablet Take 1 tablet (40 mg total) by mouth 2 (two) times daily. 07/07/14  Yes Lori P Hvozdovic, PA-C  polyethylene glycol (MIRALAX / GLYCOLAX) packet Take 17 g by mouth daily as needed for moderate constipation.   Yes Historical Provider, MD  promethazine (PHENERGAN) 25 MG tablet Take 1 tablet (25 mg total) by mouth 2 (two) times daily as needed for nausea. 06/09/14  Yes Ria Bush, MD  QUEtiapine (SEROQUEL) 400 MG tablet Take 400 mg by mouth at bedtime.   Yes Historical Provider, MD  rOPINIRole (REQUIP) 4 MG tablet TAKE 1 TABLET BY MOUTH AT BEDTIME 08/01/14  Yes Tiffany L Reed, DO  Saline (OCEAN NASAL SPRAY NA) Place 1 spray into the nose 3 (three) times daily as needed (congestion).    Yes Historical Provider, MD  simvastatin (ZOCOR) 40 MG tablet TAKE 1 TABLET BY MOUTH EVERY EVENING 08/08/14  Yes Tiffany L Reed, DO  spironolactone (ALDACTONE) 25 MG tablet TAKE 1/2 TABLET EVERY DAY 06/29/14  Yes Blane Ohara, MD  Vitamin D, Ergocalciferol, (DRISDOL) 50000 UNITS CAPS capsule TAKE 1 CAPSULE (50,000 UNITS TOTAL) BY MOUTH EVERY 7 (SEVEN) DAYS. TAKE ON FRIDAY 05/16/14  Yes Ria Bush, MD  XIFAXAN 550 MG TABS tablet TAKE 1 TABLET (550 MG TOTAL) BY MOUTH DAILY. 07/11/14  Yes Lafayette Dragon, MD  polyethylene  glycol powder (GLYCOLAX/MIRALAX) powder Take 255 g by mouth daily. 07/07/14   Lori P Hvozdovic, PA-C  sulfamethoxazole-trimethoprim (BACTRIM DS,SEPTRA DS) 800-160 MG per tablet Take 1 tablet by mouth 2 (two) times daily. Patient not taking: Reported on 08/11/2014 08/04/14   Garlon Hatchet  Danise Mina, MD    Physical Exam: Filed Vitals:   08/11/14 2130 08/11/14 2200 08/11/14 2217 08/11/14 2230  BP: 113/87 115/48 115/48 98/82  Pulse: 42 49 48 49  Temp:    98.3 F (36.8 C)  TempSrc:    Oral  Resp: 17 15 11 13   SpO2: 97% 98% 97% 97%     General:  Well-developed and nourished.  Eyes:  Anicteric no pallor.  ENT:  No discharge from the ears eyes nose or mouth.  Neck:  No mass felt. No JVD appreciated.  Cardiovascular:  S1 and S2 heard.  Respiratory:  No rhonchi or crepitations.  Abdomen:  Soft nontender bowel sounds present.  Skin:  No rash.  Musculoskeletal:  No edema.  Psychiatric:  Appears normal.  Neurologic:  Alert awake oriented to time place and person. Moves all extremities.  Labs on Admission:  Basic Metabolic Panel:  Recent Labs Lab 08/11/14 1755  NA 135*  K 5.5*  CL 97  CO2 26  GLUCOSE 107*  BUN 65*  CREATININE 2.69*  CALCIUM 9.3   Liver Function Tests:  Recent Labs Lab 08/11/14 1755  AST 33  ALT 29  ALKPHOS 124*  BILITOT 0.8  PROT 6.9  ALBUMIN 3.5   No results for input(s): LIPASE, AMYLASE in the last 168 hours. No results for input(s): AMMONIA in the last 168 hours. CBC:  Recent Labs Lab 08/11/14 1755  WBC 4.5  NEUTROABS 3.4  HGB 9.5*  HCT 28.8*  MCV 98.6  PLT 91*   Cardiac Enzymes:  Recent Labs Lab 08/11/14 1755  TROPONINI <0.30    BNP (last 3 results)  Recent Labs  01/19/14 1217 03/26/14 0259 08/11/14 1755  PROBNP 85.0 3352.0* 226.4*   CBG: No results for input(s): GLUCAP in the last 168 hours.  Radiological Exams on Admission: Dg Chest 2 View  08/11/2014   CLINICAL DATA:  Shortness of breath.  EXAM: CHEST  2 VIEW   COMPARISON:  March 28, 2014.  FINDINGS: Stable cardiomediastinal silhouette. Both lungs are clear. No pneumothorax or pleural effusion is noted. The visualized skeletal structures are unremarkable.  IMPRESSION: No acute cardiopulmonary abnormality seen.   Electronically Signed   By: Sabino Dick M.D.   On: 08/11/2014 19:34    EKG: Independently reviewed.  Sinus bradycardia.   Assessment/Plan Principal Problem:   SOB (shortness of breath) Active Problems:   Essential hypertension   Angiodysplasia of intestine   Hepatic cirrhosis   Acute renal failure   Bradycardia   Diabetes mellitus type 2, controlled   Chronic obstructive pulmonary disease with acute exacerbation  1. Shortness of breath - cause not very clear. Could be from deconditioning and hypotension and bradycardia causing difficult to exert. Check d-dimer. See #2 is regarding to diuretics. Patient on exam does not have any wheezing. Get physical therapy consult. 2. Acute renal failure - patient is mildly hypotensive with bradycardia. At this time holding off patient's diuretics and antihypertensives. And alcohol also place patient on gentle hydration for 12 hours. Closely follow intake output metabolic panel and I have ordered urine sodium. 3. Cirrhosis of the liver - holding diuretics secondary to #2. See #2. 4. Chronic anemia secondary to chronic bleed secondary to angiodysplasia - follow CBC closely. 5. Bradycardias - holding beta blockers for now. 6. History of diastolic CHF last EF measured was 55-60% - read #2 was realigned to diuretics. 7. COPD - patient is not wheezing on exam. Continue home inhalers. 8. Diabetes mellitus type 2 controlled -  continue home medications with sliding scale coverage.    Code Status: Full code.  Family Communication: Patient's husband at the bedside.  Disposition Plan: Admit to inpatient.    KAKRAKANDY,ARSHAD N. [facility] Pager [phone].  If 7PM-7AM, please contact  night-coverage [email or web address] Password [email or web address] 08/11/2014, 11:30 PM

## 2014-08-11 NOTE — ED Notes (Signed)
Pt to ED via [facility]- pt reports waking up this morning with CP that has since resolved.  Pt reports ongoing shortness of breath throughout the day- EMS found pt 81% on 3L home O2- pt on 3 L all the time at home.  Pt 100% on 6L at this time- denies CP.

## 2014-08-11 NOTE — ED Notes (Signed)
Pt voided while sitting on the bedside potty unable to get a sample because pt filled it with toilet paper after wiping. pt informed that we will need another urine sample when she can go.

## 2014-08-12 ENCOUNTER — Telehealth: Payer: Self-pay

## 2014-08-12 ENCOUNTER — Telehealth: Payer: Self-pay | Admitting: Family Medicine

## 2014-08-12 ENCOUNTER — Inpatient Hospital Stay (HOSPITAL_COMMUNITY): Payer: Medicare Other

## 2014-08-12 ENCOUNTER — Encounter (HOSPITAL_COMMUNITY): Payer: Medicare Other

## 2014-08-12 DIAGNOSIS — K5520 Angiodysplasia of colon without hemorrhage: Secondary | ICD-10-CM

## 2014-08-12 LAB — CBC WITH DIFFERENTIAL/PLATELET
BASOS PCT: 0 % (ref 0–1)
Basophils Absolute: 0 10*3/uL (ref 0.0–0.1)
Eosinophils Absolute: 0.1 10*3/uL (ref 0.0–0.7)
Eosinophils Relative: 4 % (ref 0–5)
HCT: 25.8 % — ABNORMAL LOW (ref 36.0–46.0)
Hemoglobin: 8.3 g/dL — ABNORMAL LOW (ref 12.0–15.0)
Lymphocytes Relative: 17 % (ref 12–46)
Lymphs Abs: 0.4 10*3/uL — ABNORMAL LOW (ref 0.7–4.0)
MCH: 32.5 pg (ref 26.0–34.0)
MCHC: 32.2 g/dL (ref 30.0–36.0)
MCV: 101.2 fL — ABNORMAL HIGH (ref 78.0–100.0)
MONO ABS: 0.2 10*3/uL (ref 0.1–1.0)
Monocytes Relative: 10 % (ref 3–12)
NEUTROS PCT: 70 % (ref 43–77)
Neutro Abs: 1.8 10*3/uL (ref 1.7–7.7)
Platelets: 73 10*3/uL — ABNORMAL LOW (ref 150–400)
RBC: 2.55 MIL/uL — ABNORMAL LOW (ref 3.87–5.11)
RDW: 15.1 % (ref 11.5–15.5)
WBC: 2.5 10*3/uL — ABNORMAL LOW (ref 4.0–10.5)

## 2014-08-12 LAB — COMPREHENSIVE METABOLIC PANEL
ALT: 26 U/L (ref 0–35)
AST: 32 U/L (ref 0–37)
Albumin: 3.1 g/dL — ABNORMAL LOW (ref 3.5–5.2)
Alkaline Phosphatase: 112 U/L (ref 39–117)
Anion gap: 12 (ref 5–15)
BUN: 62 mg/dL — ABNORMAL HIGH (ref 6–23)
CO2: 25 meq/L (ref 19–32)
Calcium: 9 mg/dL (ref 8.4–10.5)
Chloride: 102 mEq/L (ref 96–112)
Creatinine, Ser: 2.68 mg/dL — ABNORMAL HIGH (ref 0.50–1.10)
GFR calc non Af Amer: 17 mL/min — ABNORMAL LOW (ref >90)
GFR, EST AFRICAN AMERICAN: 19 mL/min — AB (ref >90)
GLUCOSE: 165 mg/dL — AB (ref 70–99)
POTASSIUM: 4.7 meq/L (ref 3.7–5.3)
SODIUM: 139 meq/L (ref 137–147)
TOTAL PROTEIN: 6.1 g/dL (ref 6.0–8.3)
Total Bilirubin: 0.7 mg/dL (ref 0.3–1.2)

## 2014-08-12 LAB — TSH: TSH: 1.97 u[IU]/mL (ref 0.350–4.500)

## 2014-08-12 LAB — TROPONIN I: Troponin I: <0.3 ng/mL (ref <0.30)

## 2014-08-12 LAB — TYPE AND SCREEN
ABO/RH(D): O POS
Antibody Screen: POSITIVE
DAT, IGG: NEGATIVE
PT AG TYPE: NEGATIVE

## 2014-08-12 LAB — CBC
HCT: 29.6 % — ABNORMAL LOW (ref 36.0–46.0)
Hemoglobin: 9.7 g/dL — ABNORMAL LOW (ref 12.0–15.0)
MCH: 33.3 pg (ref 26.0–34.0)
MCHC: 32.8 g/dL (ref 30.0–36.0)
MCV: 101.7 fL — ABNORMAL HIGH (ref 78.0–100.0)
Platelets: 112 10*3/uL — ABNORMAL LOW (ref 150–400)
RBC: 2.91 MIL/uL — AB (ref 3.87–5.11)
RDW: 15.2 % (ref 11.5–15.5)
WBC: 5.2 10*3/uL (ref 4.0–10.5)

## 2014-08-12 LAB — CREATININE, URINE, RANDOM: CREATININE, URINE: 67.84 mg/dL

## 2014-08-12 LAB — D-DIMER, QUANTITATIVE (NOT AT ARMC): D DIMER QUANT: 0.52 ug{FEU}/mL — AB (ref 0.00–0.48)

## 2014-08-12 LAB — SODIUM, URINE, RANDOM: Sodium, Ur: 78 mEq/L

## 2014-08-12 LAB — GLUCOSE, CAPILLARY
GLUCOSE-CAPILLARY: 220 mg/dL — AB (ref 70–99)
GLUCOSE-CAPILLARY: 124 mg/dL — AB (ref 70–99)
Glucose-Capillary: 158 mg/dL — ABNORMAL HIGH (ref 70–99)
Glucose-Capillary: 159 mg/dL — ABNORMAL HIGH (ref 70–99)

## 2014-08-12 MED ORDER — TECHNETIUM TC 99M DIETHYLENETRIAME-PENTAACETIC ACID: 40.0000 | Freq: Once | INTRAVENOUS | Status: AC | PRN

## 2014-08-12 MED ORDER — LACTULOSE 10 GM/15ML PO SOLN
20.0000 g | Freq: Two times a day (BID) | ORAL | Status: DC
  Administered 2014-08-13 – 2014-08-16 (×7): 20 g via ORAL
  Filled 2014-08-12 (×8): qty 30

## 2014-08-12 MED ORDER — TECHNETIUM TO 99M ALBUMIN AGGREGATED
6.0000 | Freq: Once | INTRAVENOUS | Status: AC | PRN
  Administered 2014-08-12: 6 via INTRAVENOUS

## 2014-08-12 NOTE — Telephone Encounter (Signed)
Spoke with patient and advised results   

## 2014-08-12 NOTE — Care Management Note (Addendum)
    Page 1 of 2   08/16/2014     1:08:54 PM CARE MANAGEMENT NOTE 08/16/2014  Patient:  IVYROSE, HASHMAN A   Account Number:  [id number]  Date Initiated:  08/12/2014  Documentation initiated by:  AMERSON,JULIE  Subjective/Objective Assessment:   Pt adm on 08/11/14 with SOB, COPD.  PTA, pt resides at home with husband.  She has caregivers daily from 8a-1p and 1p to 4p.  Husband is home in the evenings.  She is on chronic home oxygen, provided by [facility].     Action/Plan:   Pt has used [facility] care in the past, provided by [facility] care.  PT recommending HHPT at dc.  Would recommend cont HH follow up; MD please leave orders prior to dc.  Case mgr will be happy to arrange.   Anticipated DC Date:  08/15/2014   Anticipated DC Plan:  [place]  CM consult      [facility] Choice  HOME HEALTH   Choice offered to / List presented to:  C-1 Patient        [facility] arranged  HH-1 RN  [facility] PT      [place] agency  [facility]   Status of service:  Completed, signed off Medicare Important Message given?  YES (If response is "NO", the following Medicare IM given date fields will be blank) Date Medicare IM given:  08/15/2014 Medicare IM given by:  Sabastien Tyler Date Additional Medicare IM given:   Additional Medicare IM given by:    Discharge Disposition:  [place]  Per UR Regulation:  Reviewed for med. necessity/level of care/duration of stay  If discussed at [place] of Stay Meetings, dates discussed:   08/16/2014    Comments:  Mariann Laster RN, BSN, MSHL, CCM  Nurse - Case Manager,  (Unit [facility])  [phone]  08/15/2014 Patient refused HHS stating she has a sitter a home ready to provide assistance.  States also active with [facility] and does not want any further servcies.  MD has ordered HHS: RN/PT if needed. D/c home via ambulance d/t mobility issues and oxygen needs. Home oxygen 3.5 L via  [facility].   Gaetano Romberger RN, BSN, MSHL, CCM  Nurse - Case Manager,  (Unit [phone]  08/15/2014 Social:  From home with husband.  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 Hx/o HHS with [facility] OT/PT services completed 06/21/14 Dispo Plan:  HHS:  RN, PT ([facility] notified)  however, patient is not sure she wants HHS this d/c.  Patient states she did well with HHS on last d/c home. CM will continue to monitor for d/c planning needs> patient scheduled to get blood transfusion today for HBG 7.6.

## 2014-08-12 NOTE — Telephone Encounter (Signed)
[place] [facility] - Client TELEPHONE ADVICE RECORD [facility] Medical Call Center Patient Name: [name] Gender: Female DOB: [birth year] Age: 73 Y 24 D Return Phone Number: [phone] (Primary), [phone] (Secondary) Address: [place]/State/Zip: [place] [address] Client [facility] - Client Client Site [place], [place] Type Call Call Type Triage / Clinical Relationship To Patient Self Return Phone Number [phone] (Secondary) Chief Complaint BREATHING - shortness of breath or sounds breathless Initial Comment Caller states they feel they have a kidney infection as well they are having difficulty breathing. PreDisposition Go to ED Nurse Assessment Nurse: Donalynn Furlong, RN, Myna Hidalgo Date/Time Eilene Ghazi Time): 08/11/2014 3:51:34 PM Confirm and document reason for call. If symptomatic, describe symptoms. ---Caller states they feel they have a kidney infection as well they are having difficulty breathing. called back, spoke with sister who is in the house with her. states pt is short of breath, has COPD, and signs of a kidney infection. States she can barely walk and she is worried she will "fall -out" Has the patient traveled out of the country within the last 30 days? ---Not Applicable Does the patient require triage? ---No Please document clinical information provided and list any resource used. ---instructed sister to call for an ambulance/911 after we hang up to have her taken to the ER right away to be evaluated. Told her I would call back to make sure ambulance is en route Guidelines Guideline Title Affirmed Question Affirmed Notes Nurse Date/Time Eilene Ghazi Time) Breathing Difficulty SEVERE difficulty breathing (e.g., struggling for each breath, speaks in single words) Donalynn Furlong, RN, Myna Hidalgo 08/11/2014 3:58:00 PM Disp. Time Eilene Ghazi  Time) Disposition Final User 08/11/2014 3:45:37 PM Send to Urgent Alean Rinne 08/11/2014 3:56:30 PM Clinical Call Donalynn Furlong, RN, Myna Hidalgo 08/11/2014 3:58:51 PM Attempt made - message left Donalynn Furlong, RN, Myna Hidalgo 08/11/2014 4:02:24 PM Attempt made - message left Donalynn Furlong, RN, Myna Hidalgo 08/11/2014 4:36:48 PM Call Completed Donalynn Furlong, RN, Myna Hidalgo 08/11/2014 4:38:10 PM Call Completed Donalynn Furlong, RN, Myna Hidalgo PLEASE NOTE: All timestamps contained within this report are represented as [place] Standard Time. CONFIDENTIALTY NOTICE: This fax transmission is intended only for the addressee. It contains information that is legally privileged, confidential or otherwise protected from use or disclosure. If you are not the intended recipient, you are strictly prohibited from reviewing, disclosing, copying using or disseminating any of this information or taking any action in reliance on or regarding this information. If you have received this fax in error, please notify us immediately by telephone so that we can arrange for its return to [place]. Phone: [phone], Toll-Free: [phone], Fax: [phone] Page: 2 of 2 Call Id: [id number] 08/11/2014 3:58:36 PM Call EMS 911 Now Yes Donalynn Furlong, RN, Myna Hidalgo Caller Understands: Yes Disagree/Comply: Comply Care Advice Given Per Guideline CALL EMS 911 NOW: Immediate medical attention is needed. You need to hang up and call 911 (or an ambulance). Psychologist, forensic Discretion: I'll call you back in a few minutes to be sure you were able to reach them.) CARE ADVICE given per Breathing Difficulty (Adult) guideline. After Care Instructions Given Call Event Type User Date / Time Description

## 2014-08-12 NOTE — Progress Notes (Signed)
Patient refused qhs dose of Lantus stating that she doesn't take it if her sugar is <130. Patient CBG at that time was 87. Will continue to monitor. Tresa Endo

## 2014-08-12 NOTE — Progress Notes (Signed)
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 She denied any productive cough, chest pain. In the ER patient was found to be short of breath which improved soon without any intervention however patient stated that when she tries to make any minimal movement she gets winded. On exam, falls found to be mildly hypotensive and bradycardiac and slight worsening of renal function on the bmet. Patient reported a couple of episodes of nausea, vomiting but no diarrhea. Chest x-ray was negative for any acute infiltrates.  Assessment/Plan: Principal Problem:   SOB (shortness of breath): Unclear etiology, possibly due to deconditioning, dehydration or diuretics - D-dimer slightly elevated at 0.52, ordered VQ scan -  Chest x-ray negative for any infiltrates, 2-D echo in 8/15 had shown normal EF 55-60%.  Active Problems:   Essential hypertension - Currently stable    Hepatic cirrhosis secondary to [facility] diuretics secondary to acute renal insufficiency    Acute renal failure likely due to dehydration and diarrhea -Patient also gets lactulose twice a day due to hepatic cirrhosis     Bradycardia - Holding beta blockers for now    Diabetes mellitus type 2, controlled - Continue sliding scale insulin    Chronic obstructive pulmonary disease with acute exacerbation - Continue home inhalers  DVT Prophylaxis:  Code Status:Full CODE STATUS  Family Communication:  Disposition:  Consultants: none  Procedures: none  Antibiotics:  none    Subjective: Patient seen and examined, feeling slightly better today although feels that  belly is getting tight  Objective: Weight change:   Intake/Output Summary (Last 24 hours) at 08/12/14 1234 Last data filed at 08/12/14 1151  Gross per 24 hour  Intake    940 ml  Output    577 ml  Net    363 ml   Blood pressure 114/84, pulse 58, temperature 98.6 F (37 C), temperature source Oral, resp. rate 22, height 5\' 4"  (1.626 m), weight 99.655 kg (219 lb 11.2 oz), SpO2 99 %.  Physical Exam: General: Alert and awake, oriented x3, not in any acute distress. CVS: S1-S2 clear, no murmur rubs or gallops Chest: Decreased breath sounds at the bases Abdomen: soft nontender,distended, normal bowel sounds  Extremities: no cyanosis, clubbing or edema noted bilaterally Neuro: Cranial nerves II-XII intact, no focal neurological deficits  Lab Results: Basic Metabolic Panel:  Recent Labs Lab 08/11/14 1755 08/12/14 0615  NA 135* 139  K 5.5* 4.7  CL 97 102  CO2 26 25  GLUCOSE 107* 165*  BUN 65* 62*  CREATININE 2.69* 2.68*  CALCIUM 9.3 9.0   Liver Function Tests:  Recent Labs Lab 08/11/14 1755 08/12/14 0615  AST 33 32  ALT 29 26  ALKPHOS 124* 112  BILITOT 0.8 0.7  PROT 6.9 6.1  ALBUMIN 3.5 3.1*   No results for input(s): LIPASE, AMYLASE in the last 168 hours. No results for input(s): AMMONIA in the last 168 hours. CBC:  Recent Labs Lab 08/11/14 1755 08/12/14 0615  WBC 4.5 2.5*  NEUTROABS 3.4 1.8  HGB 9.5* 8.3*  HCT 28.8* 25.8*  MCV 98.6 101.2*  PLT 91* 73*   Cardiac Enzymes:  Recent Labs Lab 08/11/14 0114 08/11/14 1755  TROPONINI <0.30 <0.30   BNP: Invalid input(s): POCBNP CBG:  Recent Labs Lab 08/11/14 2351 08/12/14 0559 08/12/14 1105  GLUCAP 87 158* 159*     Micro Results: No results found for this or any previous visit (from the past 240 hour(s)).  Studies/Results: Dg Chest 2 View  08/11/2014   CLINICAL DATA:  Shortness of breath.  EXAM: CHEST  2 VIEW  COMPARISON:  March 28, 2014.  FINDINGS: Stable cardiomediastinal silhouette. Both  lungs are clear. No pneumothorax or pleural effusion is noted. The visualized skeletal structures are unremarkable.  IMPRESSION: No acute cardiopulmonary abnormality seen.   Electronically Signed   By: Sabino Dick M.D.   On: 08/11/2014 19:34    Medications: Scheduled Meds: . buPROPion  150 mg Oral Daily  . guaiFENesin  600 mg Oral BID  . insulin aspart  0-9 Units Subcutaneous TID WC  . insulin glargine  80 Units Subcutaneous QHS  . lactulose  20 g Oral BID  . mometasone-formoterol  2 puff Inhalation BID  . pantoprazole  40 mg Oral BID  . QUEtiapine  400 mg Oral QHS  . rifaximin  550 mg Oral BID  . rOPINIRole  4 mg Oral QHS  . simvastatin  40 mg Oral QPM  . sodium chloride  3 mL Intravenous Q12H  . venlafaxine XR  150 mg Oral Q breakfast  . venlafaxine XR  75 mg Oral Q breakfast      LOS: 1 day   RAI,RIPUDEEP M.D. [facility] 08/12/2014, 12:34 PM Pager: [phone]  If 7PM-7AM, please contact night-coverage [email or web address] Password [email or web address]

## 2014-08-12 NOTE — Evaluation (Signed)
Physical Therapy Evaluation Patient Details Name: [name] MRN: [id number] DOB: 1940-08-30 Today's Date: 08/12/2014   History of Present Illness  [name] is a 73 y.o. 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 Patient states she was been short of breath last 3 days. Denies any associated productive cough but did have occasional diaphoresis. Denies chest pain. Patient's shortness of breath increases with minimal exertion. In the ER patient was initially found to be short of breath but short of breath improved soon without any intervention but patient stated that when she tries to make minimal moment she gets easily short of breath. On exam patient is mildly hypotensive and bradycardic and blood tests show worsening of renal function.    Clinical Impression  Pt currently with functional limitations due to decreased endurance and decreased mobility. Pt will benefit from skilled PT to increase independence and safety with mobility to allow discharge to home with HHPT. Pt with limited endurance with functional mobility. Pt stated at home she never has to walk more than a few feet to get to the bathroom and has someone with her all the time for assistance. Pt has DME listed below from a previous rehab stay. Pt will benefit from continued PT services to increase endurance and strength to allow for increased safety with mobility at discharge.      Follow Up Recommendations Home health PT    Equipment Recommendations  None recommended by PT    Recommendations for Other Services       Precautions / Restrictions Precautions Precautions: Fall Restrictions Weight Bearing Restrictions: No      Mobility  Bed Mobility Overal bed mobility: Modified Independent             General bed mobility comments: Pt with hospital bed at home. Able to use rails to pull self up from supine to sitting EOB and adjust posture as needed to get feet on floor.   Transfers Overall transfer level: Needs assistance Equipment used: Rolling walker (2 wheeled) Transfers: Sit to/from Stand Sit to Stand: Min assist         General transfer comment: Pt min assist for safety to perform transfer to stand. Pt did not need verbal cuing but did need stabilizing when moving hands from bed to walker.   Ambulation/Gait Ambulation/Gait assistance: Min guard Ambulation Distance (Feet): 35 Feet Assistive device: Rolling walker (2 wheeled) Gait Pattern/deviations: Step-through pattern;Decreased stride length;Wide base of support   Gait velocity interpretation: Below normal speed for age/gender General Gait Details: Pt able to ambulate from bed to door and back x2 with min guard assist for safety and use of RW. Pt with no loss of balance. Stated she was not short of breath but was breathing a little heavier than at rest on 3L O2. Pt's SpO2 was 88% at the end of ambulation. Took 30 seconds to increased to 93% once sitting EOB.   Stairs            Wheelchair Mobility    Modified Rankin (Stroke Patients Only)       Balance Overall balance assessment: Needs assistance         Standing balance support: During functional activity;Bilateral upper extremity supported Standing balance-Leahy Scale: Poor Standing balance comment: Pt required support of RW  to maintain balance with standing and ambulating.                              Pertinent Vitals/Pain Pain Assessment: No/denies pain  HR 67-78 throughout treatment session SpO2 decreased to 88% on 3L O2 with ambulation in room. Took 30 seconds to increase to 93% while sitting EOB. Pt increased from 93 to 97% after laying back down in bed.     Home Living Family/patient expects to be discharged to:: Private  residence Living Arrangements: Spouse/significant other Available Help at Discharge: Family;Available PRN/intermittently Type of Home: House Home Access: Stairs to enter Entrance Stairs-Rails: Right Entrance Stairs-Number of Steps: 3 Home Layout: One level Home Equipment: Walker - 2 wheels;Walker - 4 wheels;Bedside commode;Wheelchair - manual;Hospital bed Additional Comments: On 3L O2 at home    Prior Function Level of Independence: Needs assistance   Gait / Transfers Assistance Needed: uses rollator  ADL's / Homemaking Assistance Needed: spouse assist with bathing at EOB and transfer to/from toliet.        Hand Dominance   Dominant Hand: Right    Extremity/Trunk Assessment               Lower Extremity Assessment: Overall WFL for tasks assessed         Communication   Communication: No difficulties  Cognition Arousal/Alertness: Awake/alert Behavior During Therapy: WFL for tasks assessed/performed Overall Cognitive Status: Within Functional Limits for tasks assessed                      General Comments      Exercises        Assessment/Plan    PT Assessment Patient needs continued PT services  PT Diagnosis Difficulty walking   PT Problem List Decreased strength;Decreased range of motion;Decreased activity tolerance;Decreased balance;Decreased mobility;Obesity  PT Treatment Interventions Gait training;Functional mobility training;Stair training;Therapeutic activities;Therapeutic exercise;Balance training;Patient/family education   PT Goals (Current goals can be found in the Care Plan section) Acute Rehab PT Goals Patient Stated Goal: Return home PT Goal Formulation: With patient Time For Goal Achievement: 08/26/14 Potential to Achieve Goals: Fair    Frequency Min 3X/week   Barriers to discharge        Co-evaluation               End of Session Equipment Utilized During Treatment: Gait belt Activity Tolerance: Patient tolerated  treatment well;Patient limited by fatigue Patient left: in bed;with call bell/phone within reach;with family/visitor present           Time: 1028-1100 PT Time Calculation (min) (ACUTE ONLY): 32 min   Charges:   PT Evaluation $Initial PT Evaluation Tier I: 1 Procedure PT Treatments $Gait Training: 8-22 mins $Therapeutic Activity: 8-22 mins   PT G CodesJearld Shines SPT 08/12/2014, 12:01 PM  Jearld Shines, [place]  Ac[facility] [phone]

## 2014-08-12 NOTE — Telephone Encounter (Signed)
plz notify - let's wait to see how she does during hospitalization. Need to rule out certain conditions during this hospitalization prior to receiving procrit again. It will be up to hospital doctors whether she can receive procrit in house or whether to schedule next shot upon discharge.

## 2014-08-12 NOTE — Telephone Encounter (Signed)
Noted. Admitted.

## 2014-08-12 NOTE — Telephone Encounter (Signed)
Pt was admitted to [facility] Rm 27 on [address] [place]; pt needs Procrit injection today at 3:30 pm and [facility] request order for injection before pt can get injection. Pt request cb. Spoke with pt and she was told Dr Danise Mina would have to call hospital to give order for injection. Spoke with nurse on [place] and she does not know anything about this injection and request Dr Danise Mina to contact Dr Mathews Robinsons if wants pt to have injection.Please advise.

## 2014-08-13 ENCOUNTER — Inpatient Hospital Stay (HOSPITAL_COMMUNITY): Payer: Medicare Other

## 2014-08-13 LAB — BASIC METABOLIC PANEL
ANION GAP: 10 (ref 5–15)
BUN: 47 mg/dL — ABNORMAL HIGH (ref 6–23)
CHLORIDE: 106 meq/L (ref 96–112)
CO2: 24 meq/L (ref 19–32)
Calcium: 8.8 mg/dL (ref 8.4–10.5)
Creatinine, Ser: 1.88 mg/dL — ABNORMAL HIGH (ref 0.50–1.10)
GFR calc non Af Amer: 26 mL/min — ABNORMAL LOW (ref >90)
GFR, EST AFRICAN AMERICAN: 30 mL/min — AB (ref >90)
Glucose, Bld: 176 mg/dL — ABNORMAL HIGH (ref 70–99)
POTASSIUM: 4.8 meq/L (ref 3.7–5.3)
SODIUM: 140 meq/L (ref 137–147)

## 2014-08-13 LAB — CBC
HEMATOCRIT: 25 % — AB (ref 36.0–46.0)
HEMOGLOBIN: 8 g/dL — AB (ref 12.0–15.0)
MCH: 32.1 pg (ref 26.0–34.0)
MCHC: 32 g/dL (ref 30.0–36.0)
MCV: 100.4 fL — ABNORMAL HIGH (ref 78.0–100.0)
Platelets: 69 10*3/uL — ABNORMAL LOW (ref 150–400)
RBC: 2.49 MIL/uL — ABNORMAL LOW (ref 3.87–5.11)
RDW: 15 % (ref 11.5–15.5)
WBC: 2.4 10*3/uL — ABNORMAL LOW (ref 4.0–10.5)

## 2014-08-13 LAB — GLUCOSE, CAPILLARY
GLUCOSE-CAPILLARY: 140 mg/dL — AB (ref 70–99)
GLUCOSE-CAPILLARY: 200 mg/dL — AB (ref 70–99)
GLUCOSE-CAPILLARY: 179 mg/dL — AB (ref 70–99)
GLUCOSE-CAPILLARY: 264 mg/dL — AB (ref 70–99)

## 2014-08-13 MED ORDER — DIPHENHYDRAMINE-ZINC ACETATE 2-0.1 % EX CREA
TOPICAL_CREAM | Freq: Three times a day (TID) | CUTANEOUS | Status: DC | PRN
  Administered 2014-08-13 – 2014-08-16 (×4): via TOPICAL
  Filled 2014-08-13: qty 28

## 2014-08-13 MED ORDER — LEVOFLOXACIN IN D5W 750 MG/150ML IV SOLN: 750.0000 mg | INTRAVENOUS | Status: DC

## 2014-08-13 MED ORDER — LEVOFLOXACIN IN D5W 750 MG/150ML IV SOLN
750.0000 mg | INTRAVENOUS | Status: DC
  Administered 2014-08-13: 750 mg via INTRAVENOUS
  Filled 2014-08-13 (×2): qty 150

## 2014-08-13 MED ORDER — INSULIN ASPART 100 UNIT/ML ~~LOC~~ SOLN
0.0000 [IU] | Freq: Every day | SUBCUTANEOUS | Status: DC
  Administered 2014-08-13: 3 [IU] via SUBCUTANEOUS
  Administered 2014-08-15: 5 [IU] via SUBCUTANEOUS

## 2014-08-13 MED ORDER — PREDNISONE 20 MG PO TABS
40.0000 mg | ORAL_TABLET | Freq: Every day | ORAL | Status: AC
  Administered 2014-08-13 – 2014-08-15 (×3): 40 mg via ORAL
  Filled 2014-08-13 (×3): qty 2

## 2014-08-13 MED ORDER — SALINE SPRAY 0.65 % NA SOLN
1.0000 | NASAL | Status: DC | PRN
  Filled 2014-08-13: qty 44

## 2014-08-13 MED ORDER — LORATADINE 10 MG PO TABS
10.0000 mg | ORAL_TABLET | Freq: Every day | ORAL | Status: DC
  Administered 2014-08-13 – 2014-08-16 (×4): 10 mg via ORAL
  Filled 2014-08-13 (×4): qty 1

## 2014-08-13 MED ORDER — INSULIN ASPART 100 UNIT/ML ~~LOC~~ SOLN
0.0000 [IU] | Freq: Three times a day (TID) | SUBCUTANEOUS | Status: DC
  Administered 2014-08-13: 3 [IU] via SUBCUTANEOUS
  Administered 2014-08-14 (×2): 8 [IU] via SUBCUTANEOUS
  Administered 2014-08-14: 15 [IU] via SUBCUTANEOUS
  Administered 2014-08-15: 5 [IU] via SUBCUTANEOUS
  Administered 2014-08-15: 15 [IU] via SUBCUTANEOUS
  Administered 2014-08-15: 5 [IU] via SUBCUTANEOUS
  Administered 2014-08-16: 3 [IU] via SUBCUTANEOUS

## 2014-08-13 NOTE — Progress Notes (Signed)
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 She denied any productive cough, chest pain. In the ER patient was found to be short of breath which improved soon without any intervention however patient stated that when she tries to make any minimal movement she gets winded. On exam, falls found to be mildly hypotensive and bradycardiac and slight worsening of renal function on the bmet. Patient reported a couple of episodes of nausea, vomiting but no diarrhea. Chest x-ray was negative for any acute infiltrates.  Assessment/Plan: Principal Problem:   SOB (shortness of breath): Unclear etiology, possibly due to deconditioning, dehydration or diuretics, patient also has acute sinusitis which could cause shortness of breath - D-dimer slightly elevated at 0.52, VQ scan with low probability for PE -  2-D echo in 8/15 had shown normal EF 55-60%. - Patient now reporting productive cough with greenish yellow sputum, started on IV levofloxacin, Claritin daily, saline nasal spray, repeat 2 view chest x-ray, added prednisone   Active Problems:   Essential hypertension - Currently stable    Hepatic cirrhosis secondary to [employer] diuretics secondary to acute renal insufficiency    Acute renal failure likely due to dehydration and diarrhea -Patient also gets lactulose twice a day due to hepatic cirrhosis     Bradycardia - Holding beta blockers for now    Diabetes mellitus type 2, uncontrolled likely due to steroids - Continue sliding scale insulinContinue Lantus, moderate sliding scale    Chronic obstructive pulmonary disease with acute  exacerbation - Continue home inhalers  DVT Prophylaxis:  Code Status:Full CODE STATUS  Family Communication:  Disposition:  Consultants: none  Procedures: none   Antibiotics  IV Levaquin 12/19   Subjective: Patient seen and examinedstill feels her shortness of breath, productive cough with yellowish brownish phlegm, no fevers or chills   Objective: Weight change: -0.345 kg (-12.2 oz)  Intake/Output Summary (Last 24 hours) at 08/13/14 1156 Last data filed at 08/13/14 0925  Gross per 24 hour  Intake   2020 ml  Output   1501 ml  Net    519 ml   Blood pressure 77/59, pulse 57, temperature 98.1 F (36.7 C), temperature source Oral, resp. rate 18, height 5\' 4"  (1.626 m), weight 99.31 kg (218 lb 15 oz), SpO2 100 %.  Physical Exam: General: Alert and awake, oriented x3, NAD CVS: S1-S2 clear, no murmur rubs or gallops Chest: Decreased breath sounds at the bases Abdomen: soft nontender,distended, normal bowel sounds  Extremities: no cyanosis, clubbing or edema noted bilaterally   Lab Results: Basic Metabolic Panel:  Recent Labs Lab 08/12/14 0615 08/13/14 0349  NA 139 140  K 4.7 4.8  CL 102 106  CO2 25 24  GLUCOSE 165* 176*  BUN 62* 47*  CREATININE 2.68* 1.88*  CALCIUM 9.0 8.8   Liver Function Tests:  Recent Labs Lab 08/11/14 1755 08/12/14 0615  AST 33 32  ALT 29 26  ALKPHOS 124* 112  BILITOT 0.8 0.7  PROT 6.9 6.1  ALBUMIN 3.5 3.1*   No results for input(s): LIPASE, AMYLASE in the last 168 hours. No results for input(s): AMMONIA in the  last 168 hours. CBC:  Recent Labs Lab 08/12/14 0615 08/13/14 0349  WBC 2.5* 2.4*  NEUTROABS 1.8  --   HGB 8.3* 8.0*  HCT 25.8* 25.0*  MCV 101.2* 100.4*  PLT 73* 69*   Cardiac Enzymes:  Recent Labs Lab 08/11/14 0114 08/11/14 1755  TROPONINI <0.30 <0.30   BNP: Invalid input(s): POCBNP CBG:  Recent Labs Lab 08/12/14 1105 08/12/14 1624 08/12/14 2102 08/13/14 0600 08/13/14 1113  GLUCAP 159*  220* 124* 140* 200*     Micro Results: No results found for this or any previous visit (from the past 240 hour(s)).  Studies/Results: Dg Chest 2 View  08/11/2014   CLINICAL DATA:  Shortness of breath.  EXAM: CHEST  2 VIEW  COMPARISON:  March 28, 2014.  FINDINGS: Stable cardiomediastinal silhouette. Both lungs are clear. No pneumothorax or pleural effusion is noted. The visualized skeletal structures are unremarkable.  IMPRESSION: No acute cardiopulmonary abnormality seen.   Electronically Signed   By: Sabino Dick M.D.   On: 08/11/2014 19:34   Nm Pulmonary Perf And Vent  08/12/2014   CLINICAL DATA:  Shortness of breath with mid back pain. History of COPD, congestive heart failure and chronic bronchitis. Renal failure. Initial encounter.  EXAM: NUCLEAR MEDICINE VENTILATION - PERFUSION LUNG SCAN  TECHNIQUE: Ventilation images were obtained in multiple projections using inhaled aerosol technetium 99 M DTPA. Perfusion images were obtained in multiple projections after intravenous injection of Tc-46m MAA.  RADIOPHARMACEUTICALS:  40.0 mCi Tc-56m DTPA aerosol and 6.0 mCi Tc-72m MAA  COMPARISON:  Radiographs 08/11/2014. Non contrast chest CT 03/25/2014.  FINDINGS: Ventilation: Limited examination demonstrates patchy clumping of the aerosol in the tracheobronchial tree and multiple peripheral ventilation defects consistent with obstructive lung disease.  Perfusion: The previous examination appears matched to the ventilatory study. There are multiple small perfusion defects bilaterally attributed to obstructive lung disease. No large or wedge-shaped perfusion defects are seen to suggest pulmonary embolism.  IMPRESSION: Low probability for acute pulmonary embolism. There is evidence of underlying chronic obstructive lung disease.   Electronically Signed   By: Camie Patience M.D.   On: 08/12/2014 13:50    Medications: Scheduled Meds: . buPROPion  150 mg Oral Daily  . guaiFENesin  600 mg Oral BID  . insulin  aspart  0-9 Units Subcutaneous TID WC  . insulin glargine  80 Units Subcutaneous QHS  . lactulose  20 g Oral BID  . levofloxacin (LEVAQUIN) IV  750 mg Intravenous Q48H  . loratadine  10 mg Oral Daily  . mometasone-formoterol  2 puff Inhalation BID  . pantoprazole  40 mg Oral BID  . predniSONE  40 mg Oral Q breakfast  . QUEtiapine  400 mg Oral QHS  . rifaximin  550 mg Oral BID  . rOPINIRole  4 mg Oral QHS  . simvastatin  40 mg Oral QPM  . sodium chloride  3 mL Intravenous Q12H      LOS: 2 days   Barb Shear M.D. [facility] 08/13/2014, 11:56 AM Pager: [phone]  If 7PM-7AM, please contact night-coverage [email or web address] Password [email or web address]

## 2014-08-14 DIAGNOSIS — I359 Nonrheumatic aortic valve disorder, unspecified: Secondary | ICD-10-CM

## 2014-08-14 LAB — BASIC METABOLIC PANEL
Anion gap: 13 (ref 5–15)
BUN: 36 mg/dL — AB (ref 6–23)
CHLORIDE: 103 meq/L (ref 96–112)
CO2: 21 mEq/L (ref 19–32)
CREATININE: 1.49 mg/dL — AB (ref 0.50–1.10)
Calcium: 9.4 mg/dL (ref 8.4–10.5)
GFR calc non Af Amer: 34 mL/min — ABNORMAL LOW (ref >90)
GFR, EST AFRICAN AMERICAN: 40 mL/min — AB (ref >90)
GLUCOSE: 289 mg/dL — AB (ref 70–99)
Potassium: 5.3 mEq/L (ref 3.7–5.3)
Sodium: 137 mEq/L (ref 137–147)

## 2014-08-14 LAB — HEMOGLOBIN A1C
Hgb A1c MFr Bld: 6.5 % — ABNORMAL HIGH (ref <5.7)
MEAN PLASMA GLUCOSE: 140 mg/dL — AB (ref <117)

## 2014-08-14 LAB — CBC
HCT: 27.4 % — ABNORMAL LOW (ref 36.0–46.0)
Hemoglobin: 8.9 g/dL — ABNORMAL LOW (ref 12.0–15.0)
MCH: 33.3 pg (ref 26.0–34.0)
MCHC: 32.5 g/dL (ref 30.0–36.0)
MCV: 102.6 fL — ABNORMAL HIGH (ref 78.0–100.0)
Platelets: 69 10*3/uL — ABNORMAL LOW (ref 150–400)
RBC: 2.67 MIL/uL — ABNORMAL LOW (ref 3.87–5.11)
RDW: 14.4 % (ref 11.5–15.5)
WBC: 2.7 10*3/uL — ABNORMAL LOW (ref 4.0–10.5)

## 2014-08-14 LAB — GLUCOSE, CAPILLARY
GLUCOSE-CAPILLARY: 211 mg/dL — AB (ref 70–99)
Glucose-Capillary: 256 mg/dL — ABNORMAL HIGH (ref 70–99)
Glucose-Capillary: 287 mg/dL — ABNORMAL HIGH (ref 70–99)
Glucose-Capillary: 367 mg/dL — ABNORMAL HIGH (ref 70–99)

## 2014-08-14 NOTE — Progress Notes (Signed)
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 She denied any productive cough, chest pain. In the ER patient was found to be short of breath which improved soon without any intervention however patient stated that when she tries to make any minimal movement she gets winded. On exam, falls found to be mildly hypotensive and bradycardiac and slight worsening of renal function on the bmet. Patient reported a couple of episodes of nausea, vomiting but no diarrhea. Chest x-ray was negative for any acute infiltrates.  Assessment/Plan: Principal Problem:   SOB (shortness of breath): Unclear etiology, possibly due to deconditioning, dehydration or diuretics, patient also has acute sinusitis which could cause shortness of breath - D-dimer slightly elevated at 0.52, VQ scan with low probability for PE -  2-D echo in 8/15 had shown normal EF 55-60%. - Patient now reporting productive cough with greenish yellow sputum, started on IV levofloxacin, Claritin daily, saline nasal spray, repeat 2 view chest x-ray, added prednisone   Active Problems:   Essential hypertension - Currently stable    Hepatic cirrhosis secondary to [employer] diuretics secondary to acute renal insufficiency    Acute renal failure likely due to dehydration and diarrhea -Patient also gets lactulose twice a day due to hepatic cirrhosis     Bradycardia - Holding beta blockers for now    Diabetes mellitus type 2, uncontrolled likely due to steroids - Continue sliding scale insulinContinue Lantus, moderate sliding scale    Chronic obstructive pulmonary disease with acute  exacerbation - Continue home inhalers  DVT Prophylaxis:  Code Status:Full CODE STATUS  Family Communication:  Disposition:  Consultants: none  Procedures: none   Antibiotics  IV Levaquin 12/19   Subjective: Patient seen and examined, still feels her shortness of breath is improving, no coughing or chest pain at this time   Objective: Weight change: 0.164 kg (5.8 oz)  Intake/Output Summary (Last 24 hours) at 08/14/14 1137 Last data filed at 08/14/14 0937  Gross per 24 hour  Intake   1300 ml  Output   2500 ml  Net  -1200 ml   Blood pressure 153/50, pulse 76, temperature 97.7 F (36.5 C), temperature source Oral, resp. rate 18, height 5\' 4"  (1.626 m), weight 99.474 kg (219 lb 4.8 oz), SpO2 98 %.  Physical Exam: General: Alert and awake, oriented x3, NAD CVS: S1-S2 clear, no murmur rubs or gallops Chest: Decreased breath sounds at the bases Abdomen: soft nontender,distended, normal bowel sounds  Extremities: no c/c/e b/l    Lab Results: Basic Metabolic Panel:  Recent Labs Lab 08/13/14 0349 08/14/14 0327  NA 140 137  K 4.8 5.3  CL 106 103  CO2 24 21  GLUCOSE 176* 289*  BUN 47* 36*  CREATININE 1.88* 1.49*  CALCIUM 8.8 9.4   Liver Function Tests:  Recent Labs Lab 08/11/14 1755 08/12/14 0615  AST 33 32  ALT 29 26  ALKPHOS 124* 112  BILITOT 0.8 0.7  PROT 6.9 6.1  ALBUMIN 3.5 3.1*   No results for input(s): LIPASE, AMYLASE in the last 168 hours. No results for input(s): AMMONIA in the last 168 hours. CBC:  Recent Labs Lab 08/12/14 0615 08/13/14 0349 08/14/14 0327  WBC 2.5* 2.4* 2.7*  NEUTROABS 1.8  --   --   HGB 8.3* 8.0* 8.9*  HCT 25.8* 25.0* 27.4*  MCV 101.2* 100.4* 102.6*  PLT 73* 69* 69*   Cardiac Enzymes:  Recent Labs Lab 08/11/14 0114 08/11/14 1755  TROPONINI <0.30 <0.30   BNP: Invalid input(s): POCBNP CBG:  Recent Labs Lab 08/13/14 0600 08/13/14 1113 08/13/14 1624 08/13/14 2128 08/14/14 0632  GLUCAP 140* 200*  179* 264* 256*     Micro Results: No results found for this or any previous visit (from the past 240 hour(s)).  Studies/Results: Dg Chest 2 View  08/13/2014   CLINICAL DATA:  Pneumonia.  Congestive heart failure  EXAM: CHEST  2 VIEW  COMPARISON:  Radiograph 08/11/2014  FINDINGS: Normal cardiac silhouette. Ectatic aorta. Prominent pulmonary arteries There is mild interstitial pattern similar prior. No pleural fluid. No pneumothorax.  IMPRESSION: Mild interstitial edema.  No infiltrate.   Electronically Signed   By: Suzy Bouchard M.D.   On: 08/13/2014 20:11   Dg Chest 2 View  08/11/2014   CLINICAL DATA:  Shortness of breath.  EXAM: CHEST  2 VIEW  COMPARISON:  March 28, 2014.  FINDINGS: Stable cardiomediastinal silhouette. Both lungs are clear. No pneumothorax or pleural effusion is noted. The visualized skeletal structures are unremarkable.  IMPRESSION: No acute cardiopulmonary abnormality seen.   Electronically Signed   By: Sabino Dick M.D.   On: 08/11/2014 19:34   Nm Pulmonary Perf And Vent  08/12/2014   CLINICAL DATA:  Shortness of breath with mid back pain. History of COPD, congestive heart failure and chronic bronchitis. Renal failure. Initial encounter.  EXAM: NUCLEAR MEDICINE VENTILATION - PERFUSION LUNG SCAN  TECHNIQUE: Ventilation images were obtained in multiple projections using inhaled aerosol technetium 99 M DTPA. Perfusion images were obtained in multiple projections after intravenous injection of Tc-106m MAA.  RADIOPHARMACEUTICALS:  40.0 mCi Tc-47m DTPA aerosol and 6.0 mCi Tc-75m MAA  COMPARISON:  Radiographs 08/11/2014. Non contrast chest CT 03/25/2014.  FINDINGS: Ventilation: Limited examination demonstrates patchy clumping of the aerosol in the tracheobronchial tree and multiple peripheral ventilation defects consistent with obstructive lung disease.  Perfusion: The previous examination appears matched to the ventilatory study. There are multiple small perfusion defects  bilaterally attributed to obstructive lung disease. No large or wedge-shaped perfusion defects are seen to suggest pulmonary embolism.  IMPRESSION: Low probability for acute pulmonary embolism. There is evidence of underlying chronic obstructive lung disease.   Electronically Signed   By: Camie Patience M.D.   On: 08/12/2014 13:50    Medications: Scheduled Meds: . buPROPion  150 mg Oral Daily  . guaiFENesin  600 mg Oral BID  . insulin aspart  0-15 Units Subcutaneous TID WC  . insulin aspart  0-5 Units Subcutaneous QHS  . insulin glargine  80 Units Subcutaneous QHS  . lactulose  20 g Oral BID  . levofloxacin (LEVAQUIN) IV  750 mg Intravenous Q48H  . loratadine  10 mg Oral Daily  . mometasone-formoterol  2 puff Inhalation BID  . pantoprazole  40 mg Oral BID  . predniSONE  40 mg Oral Q breakfast  . QUEtiapine  400 mg Oral QHS  . rifaximin  550 mg Oral BID  . rOPINIRole  4 mg Oral QHS  . simvastatin  40 mg Oral QPM  . sodium chloride  3 mL Intravenous Q12H      LOS: 3 days   RAI,RIPUDEEP M.D. [facility]  08/14/2014, 11:37 AM Pager: [phone]  If 7PM-7AM, please contact night-coverage [email or web address] Password [email or web address]

## 2014-08-14 NOTE — Progress Notes (Signed)
  Echocardiogram 2D Echocardiogram has been performed.  Johny Chess 08/14/2014, 11:38 AM

## 2014-08-15 LAB — BASIC METABOLIC PANEL
Anion gap: 11 (ref 5–15)
BUN: 32 mg/dL — ABNORMAL HIGH (ref 6–23)
CALCIUM: 9.4 mg/dL (ref 8.4–10.5)
CO2: 21 meq/L (ref 19–32)
Chloride: 102 mEq/L (ref 96–112)
Creatinine, Ser: 1.36 mg/dL — ABNORMAL HIGH (ref 0.50–1.10)
GFR calc non Af Amer: 38 mL/min — ABNORMAL LOW (ref >90)
GFR, EST AFRICAN AMERICAN: 44 mL/min — AB (ref >90)
Glucose, Bld: 216 mg/dL — ABNORMAL HIGH (ref 70–99)
Potassium: 5.1 mEq/L (ref 3.7–5.3)
SODIUM: 134 meq/L — AB (ref 137–147)

## 2014-08-15 LAB — GLUCOSE, CAPILLARY
Glucose-Capillary: 216 mg/dL — ABNORMAL HIGH (ref 70–99)
Glucose-Capillary: 250 mg/dL — ABNORMAL HIGH (ref 70–99)
Glucose-Capillary: 370 mg/dL — ABNORMAL HIGH (ref 70–99)
Glucose-Capillary: 368 mg/dL — ABNORMAL HIGH (ref 70–99)

## 2014-08-15 LAB — IRON AND TIBC
Iron: 52 ug/dL (ref 42–135)
SATURATION RATIOS: 17 % — AB (ref 20–55)
TIBC: 299 ug/dL (ref 250–470)
UIBC: 247 ug/dL (ref 125–400)

## 2014-08-15 LAB — CBC
HCT: 24.1 % — ABNORMAL LOW (ref 36.0–46.0)
Hemoglobin: 7.6 g/dL — ABNORMAL LOW (ref 12.0–15.0)
MCH: 31.7 pg (ref 26.0–34.0)
MCHC: 31.5 g/dL (ref 30.0–36.0)
MCV: 100.4 fL — ABNORMAL HIGH (ref 78.0–100.0)
Platelets: 60 10*3/uL — ABNORMAL LOW (ref 150–400)
RBC: 2.4 MIL/uL — ABNORMAL LOW (ref 3.87–5.11)
RDW: 14.4 % (ref 11.5–15.5)
WBC: 3 10*3/uL — AB (ref 4.0–10.5)

## 2014-08-15 LAB — RETICULOCYTES
RBC.: 2.48 MIL/uL — ABNORMAL LOW (ref 3.87–5.11)
Retic Count, Absolute: 104.2 10*3/uL (ref 19.0–186.0)
Retic Ct Pct: 4.2 % — ABNORMAL HIGH (ref 0.4–3.1)

## 2014-08-15 LAB — VITAMIN B12: VITAMIN B 12: 901 pg/mL (ref 211–911)

## 2014-08-15 LAB — PREPARE RBC (CROSSMATCH)

## 2014-08-15 LAB — FOLATE: Folate: >20 ng/mL

## 2014-08-15 LAB — FERRITIN: Ferritin: 76 ng/mL (ref 10–291)

## 2014-08-15 MED ORDER — FUROSEMIDE 40 MG PO TABS
40.0000 mg | ORAL_TABLET | Freq: Every day | ORAL | Status: DC
  Administered 2014-08-15 – 2014-08-16 (×2): 40 mg via ORAL
  Filled 2014-08-15 (×2): qty 1

## 2014-08-15 MED ORDER — LEVOFLOXACIN 750 MG PO TABS
750.0000 mg | ORAL_TABLET | ORAL | Status: DC
  Administered 2014-08-15: 750 mg via ORAL
  Filled 2014-08-15: qty 1

## 2014-08-15 MED ORDER — SODIUM CHLORIDE 0.9 % IV SOLN
Freq: Once | INTRAVENOUS | Status: AC
  Administered 2014-08-15: 15:00:00 via INTRAVENOUS

## 2014-08-15 MED ORDER — FUROSEMIDE 10 MG/ML IJ SOLN
20.0000 mg | Freq: Once | INTRAMUSCULAR | Status: AC
  Administered 2014-08-15: 20 mg via INTRAVENOUS

## 2014-08-15 NOTE — Progress Notes (Signed)
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 She denied any productive cough, chest pain. In the ER patient was found to be short of breath which improved soon without any intervention however patient stated that when she tries to make any minimal movement she gets winded. On exam, falls found to be mildly hypotensive and bradycardiac and slight worsening of renal function on the bmet. Patient reported a couple of episodes of nausea, vomiting but no diarrhea. Chest x-ray was negative for any acute infiltrates.  Assessment/Plan: Principal Problem:   SOB (shortness of breath): Multifactorial likely due to acute bronchitis with anemia, deconditioning, dehydration or diuretics, patient also has acute sinusitis which could cause shortness of breath - D-dimer slightly elevated at 0.52, VQ scan with low probability for PE -  2-D echo in 8/15 had shown normal EF 55-60%. - Patient now reporting productive cough with greenish yellow sputum, started on IV levofloxacin, Claritin daily, saline nasal spray, prednisone - Chest x-ray showed mild interstitial edema, no infiltrates  Active Problems: Anemia, thrombocytopenia: Likely due to underlying cirrhosis however rule out occult bleeding - Obtain FOBT, anemia panel, transfuse 2 units packed RBCs     Essential hypertension - Currently stable    Hepatic cirrhosis secondary to [facility] Creatinine function improving, restart Lasix    Acute renal failure likely due to dehydration and diarrhea - Improving, restart Lasix -Patient also gets lactulose twice a day due to hepatic cirrhosis     Bradycardia -  Holding beta blockers for now    Diabetes mellitus type 2, uncontrolled likely due to steroids - Continue sliding scale insulinContinue Lantus, moderate sliding scale    Chronic obstructive pulmonary disease with acute exacerbation - Continue home inhalers  DVT Prophylaxis:  Code Status:Full CODE STATUS  Family Communication:  Disposition: Hopefully tomorrow  Consultants: none  Procedures: none   Antibiotics  IV Levaquin 12/19   Subjective: Patient seen and examined, feels somewhat better shortness of breath is improving, no fevers or chills or coughing  Objective: Weight change: 1.225 kg (2 lb 11.2 oz)  Intake/Output Summary (Last 24 hours) at 08/15/14 1141 Last data filed at 08/15/14 1100  Gross per 24 hour  Intake   2430 ml  Output   2775 ml  Net   -345 ml   Blood pressure 133/55, pulse 58, temperature 98 F (36.7 C), temperature source Oral, resp. rate 20, height 5\' 4"  (1.626 m), weight 100.699 kg (222 lb), SpO2 100 %.  Physical Exam: General: A x O x3, NAD CVS: S1-S2 clear,  Chest: Decreased breath sounds at the bases Abdomen: soft nontender,distended, normal bowel sounds  Extremities: no c/c/e b/l    Lab Results: Basic Metabolic Panel:  Recent Labs Lab 08/14/14 0327 08/15/14 0402  NA 137 134*  K 5.3 5.1  CL 103 102  CO2 21 21  GLUCOSE 289* 216*  BUN 36* 32*  CREATININE 1.49* 1.36*  CALCIUM 9.4 9.4   Liver Function Tests:  Recent Labs Lab 08/11/14 1755 08/12/14 0615  AST 33 32  ALT 29 26  ALKPHOS 124* 112  BILITOT 0.8 0.7  PROT 6.9 6.1  ALBUMIN  3.5 3.1*   No results for input(s): LIPASE, AMYLASE in the last 168 hours. No results for input(s): AMMONIA in the last 168 hours. CBC:  Recent Labs Lab 08/12/14 0615  08/14/14 0327 08/15/14 0402  WBC 2.5*  < > 2.7* 3.0*  NEUTROABS 1.8  --   --   --   HGB 8.3*  < > 8.9* 7.6*  HCT 25.8*  < > 27.4* 24.1*  MCV 101.2*  < > 102.6* 100.4*  PLT 73*  < > 69* 60*  < > = values in this  interval not displayed. Cardiac Enzymes:  Recent Labs Lab 08/11/14 0114 08/11/14 1755  TROPONINI <0.30 <0.30   BNP: Invalid input(s): POCBNP CBG:  Recent Labs Lab 08/14/14 0632 08/14/14 1150 08/14/14 1612 08/14/14 2052 08/15/14 0608  GLUCAP 256* 287* 367* 211* 216*     Micro Results: No results found for this or any previous visit (from the past 240 hour(s)).  Studies/Results: Dg Chest 2 View  08/13/2014   CLINICAL DATA:  Pneumonia.  Congestive heart failure  EXAM: CHEST  2 VIEW  COMPARISON:  Radiograph 08/11/2014  FINDINGS: Normal cardiac silhouette. Ectatic aorta. Prominent pulmonary arteries There is mild interstitial pattern similar prior. No pleural fluid. No pneumothorax.  IMPRESSION: Mild interstitial edema.  No infiltrate.   Electronically Signed   By: Suzy Bouchard M.D.   On: 08/13/2014 20:11   Dg Chest 2 View  08/11/2014   CLINICAL DATA:  Shortness of breath.  EXAM: CHEST  2 VIEW  COMPARISON:  March 28, 2014.  FINDINGS: Stable cardiomediastinal silhouette. Both lungs are clear. No pneumothorax or pleural effusion is noted. The visualized skeletal structures are unremarkable.  IMPRESSION: No acute cardiopulmonary abnormality seen.   Electronically Signed   By: Sabino Dick M.D.   On: 08/11/2014 19:34   Nm Pulmonary Perf And Vent  08/12/2014   CLINICAL DATA:  Shortness of breath with mid back pain. History of COPD, congestive heart failure and chronic bronchitis. Renal failure. Initial encounter.  EXAM: NUCLEAR MEDICINE VENTILATION - PERFUSION LUNG SCAN  TECHNIQUE: Ventilation images were obtained in multiple projections using inhaled aerosol technetium 99 M DTPA. Perfusion images were obtained in multiple projections after intravenous injection of Tc-32m MAA.  RADIOPHARMACEUTICALS:  40.0 mCi Tc-50m DTPA aerosol and 6.0 mCi Tc-44m MAA  COMPARISON:  Radiographs 08/11/2014. Non contrast chest CT 03/25/2014.  FINDINGS: Ventilation: Limited examination demonstrates patchy  clumping of the aerosol in the tracheobronchial tree and multiple peripheral ventilation defects consistent with obstructive lung disease.  Perfusion: The previous examination appears matched to the ventilatory study. There are multiple small perfusion defects bilaterally attributed to obstructive lung disease. No large or wedge-shaped perfusion defects are seen to suggest pulmonary embolism.  IMPRESSION: Low probability for acute pulmonary embolism. There is evidence of underlying chronic obstructive lung disease.   Electronically Signed   By: Camie Patience M.D.   On: 08/12/2014 13:50    Medications: Scheduled Meds: . sodium chloride   Intravenous Once  . buPROPion  150 mg Oral Daily  . furosemide  20 mg Intravenous Once  . guaiFENesin  600 mg Oral BID  . insulin aspart  0-15 Units Subcutaneous TID WC  . insulin aspart  0-5 Units Subcutaneous QHS  . insulin glargine  80 Units Subcutaneous QHS  . lactulose  20 g Oral BID  . levofloxacin  750 mg Oral Q48H  . loratadine  10 mg Oral Daily  . mometasone-formoterol  2 puff Inhalation BID  . pantoprazole  40 mg Oral BID  . QUEtiapine  400 mg Oral QHS  . rifaximin  550 mg Oral BID  . rOPINIRole  4 mg Oral QHS  . simvastatin  40 mg Oral QPM  . sodium chloride  3 mL Intravenous Q12H      LOS: 4 days   Carnetta Losada M.D. [facility] 08/15/2014, 11:41 AM Pager: [phone]  If 7PM-7AM, please contact night-coverage [email or web address] Password [email or web address]

## 2014-08-15 NOTE — Progress Notes (Signed)
UR completed Jamarii Banks K. Orla Jolliff, RN, BSN, [place], CCM  08/15/2014 12:34 PM

## 2014-08-15 NOTE — Progress Notes (Signed)
PT Cancellation Note  Patient Details Name: [name] MRN: [id number] DOB: 12/12/1940   Cancelled Treatment:    Reason Eval/Treat Not Completed: Medical issues which prohibited therapy (Pt to get blood today and currently feels too fatigued to mobilize.) Will see tomorrow.   Chrishelle Zito 08/15/2014, 2:25 PM  [facility] PT [phone]

## 2014-08-16 ENCOUNTER — Other Ambulatory Visit: Payer: Self-pay | Admitting: Family Medicine

## 2014-08-16 LAB — GLUCOSE, CAPILLARY
GLUCOSE-CAPILLARY: 171 mg/dL — AB (ref 70–99)
Glucose-Capillary: 200 mg/dL — ABNORMAL HIGH (ref 70–99)

## 2014-08-16 LAB — BASIC METABOLIC PANEL
Anion gap: 8 (ref 5–15)
BUN: 32 mg/dL — ABNORMAL HIGH (ref 6–23)
CO2: 24 mmol/L (ref 19–32)
CREATININE: 1.4 mg/dL — AB (ref 0.50–1.10)
Calcium: 9.2 mg/dL (ref 8.4–10.5)
Chloride: 105 mEq/L (ref 96–112)
GFR calc non Af Amer: 37 mL/min — ABNORMAL LOW (ref >90)
GFR, EST AFRICAN AMERICAN: 43 mL/min — AB (ref >90)
Glucose, Bld: 228 mg/dL — ABNORMAL HIGH (ref 70–99)
Potassium: 4.2 mmol/L (ref 3.5–5.1)
Sodium: 137 mmol/L (ref 135–145)

## 2014-08-16 LAB — CBC
HEMATOCRIT: 28.8 % — AB (ref 36.0–46.0)
Hemoglobin: 9.6 g/dL — ABNORMAL LOW (ref 12.0–15.0)
MCH: 32.9 pg (ref 26.0–34.0)
MCHC: 33.3 g/dL (ref 30.0–36.0)
MCV: 98.6 fL (ref 78.0–100.0)
Platelets: 67 10*3/uL — ABNORMAL LOW (ref 150–400)
RBC: 2.92 MIL/uL — ABNORMAL LOW (ref 3.87–5.11)
RDW: 15.7 % — AB (ref 11.5–15.5)
WBC: 3.5 10*3/uL — ABNORMAL LOW (ref 4.0–10.5)

## 2014-08-16 MED ORDER — DIPHENHYDRAMINE-ZINC ACETATE 2-0.1 % EX CREA: TOPICAL_CREAM | Freq: Three times a day (TID) | CUTANEOUS | Status: DC | PRN

## 2014-08-16 MED ORDER — LEVOFLOXACIN 750 MG PO TABS: 750.0000 mg | ORAL_TABLET | ORAL | Status: DC

## 2014-08-16 MED ORDER — LORATADINE 10 MG PO TABS: 10.0000 mg | ORAL_TABLET | Freq: Every day | ORAL | Status: DC

## 2014-08-16 MED ORDER — PREDNISONE 20 MG PO TABS: 30.0000 mg | ORAL_TABLET | Freq: Every day | ORAL | Status: DC

## 2014-08-16 MED ORDER — PREDNISONE 10 MG PO TABS: ORAL_TABLET | ORAL | Status: DC

## 2014-08-16 NOTE — Progress Notes (Signed)
Pt discharged to home. Discharge instructions completed. Scripts given to patient. Patient verbalizes understanding.

## 2014-08-16 NOTE — Progress Notes (Signed)
Met with patient at bedside. Patient is adamant in regards to not having home health care. Patient is currently active with [facility] for chronic disease management services.  Patient has been engaged by a [employer].  Our community based plan of care has focused on disease management of COPD and HF.  Patient will receive a post discharge transition of care call and home visits for assessments and disease process education.  Made Inpatient Case Manager aware that [facility] following. Of note, [facility] services does not replace or interfere with any services that are arranged by inpatient case management or social work.  For additional questions or referrals please contact Natividad Brood, RN, BSN, CCM at [phone].

## 2014-08-16 NOTE — Discharge Summary (Addendum)
Physician Discharge Summary  Patient ID: [name] MRN: [id number] DOB/AGE: March 01, 1941 73 y.o.  Admit date: 08/11/2014 Discharge date: 08/16/2014  Primary Care Physician:  Ria Bush, MD  Discharge Diagnoses:    . SOB (shortness of breath) likely due to acute bronchitis/COPD superimposed with acute on chronic CHF  Chronic respiratory failure on 3 L O2 via nasal cannula at home  . Acute renal failure . Angiodysplasia of intestine . Essential hypertension . Hepatic cirrhosis . Bradycardia  Consults:  None   Recommendations for Outpatient Follow-up:  Please follow with a chest x-ray in 2-3 weeks  TESTS THAT NEED FOLLOW-UP CBC, BMET   DIET: Carbmodified diet    Allergies:   Allergies  Allergen Reactions  . Acetaminophen Other (See Comments)    Liver dysfunction  . Aspirin Other (See Comments)    Causes nosebleeds  . Theophylline Nausea And Vomiting  . Penicillins Itching     Discharge Medications:   Medication List    STOP taking these medications        lisinopril 5 MG tablet  Commonly known as:  PRINIVIL,ZESTRIL     sulfamethoxazole-trimethoprim 800-160 MG per tablet  Commonly known as:  BACTRIM DS,SEPTRA DS      TAKE these medications        ALPRAZolam 1 MG tablet  Commonly known as:  XANAX  GIVE 1 TABLET BY MOUTH EVERY 8 HOURS AS NEEDED FOR ANXIETY     b complex vitamins tablet  Take 1 tablet by mouth daily.     buPROPion 150 MG 24 hr tablet  Commonly known as:  WELLBUTRIN XL  Take 150 mg by mouth daily.     CENTRUM SILVER PO  Take 1 tablet by mouth daily.     desvenlafaxine 50 MG 24 hr tablet  Commonly known as:  PRISTIQ  Take 50 mg by mouth daily. Takes with a 100mg  tablet, total daily dose is 150mg      desvenlafaxine 100 MG 24 hr tablet  Commonly known as:  PRISTIQ  Take 150 mg by mouth daily. Takes with a 50mg  tablet total of 150mg  daily     diphenhydrAMINE-zinc acetate cream  Commonly known as:  BENADRYL  Apply  topically 3 (three) times daily as needed for itching.     Fluticasone-Salmeterol 250-50 MCG/DOSE Aepb  Commonly known as:  ADVAIR  Inhale 1 puff into the lungs 2 (two) times daily.     furosemide 40 MG tablet  Commonly known as:  LASIX  Take 1 tablets (40 mg) in the morning and 1/2 tablet in the afternoon     guaiFENesin 600 MG 12 hr tablet  Commonly known as:  MUCINEX  Take 600 mg by mouth 2 (two) times daily. 2 tablet by mouth once daily     insulin glargine 100 UNIT/ML injection  Commonly known as:  LANTUS  Inject 0.8 mLs (80 Units total) into the skin at bedtime.     insulin lispro 100 UNIT/ML KiwkPen  Commonly known as:  HUMALOG  Inject 15-30 Units into the skin daily with supper. SSI     lactulose (encephalopathy) 10 GM/15ML Soln  Commonly known as:  CHRONULAC  Take 30 mLs (20 g total) by mouth 2 (two) times daily. constipation     levalbuterol 0.63 MG/3ML nebulizer solution  Commonly known as:  XOPENEX  Take 0.63 mg by nebulization every 8 (eight) hours as needed for shortness of breath.     levofloxacin 750 MG tablet  Commonly known as:  LEVAQUIN  Take 1 tablet (750 mg total) by mouth every other day. For 3 more doses  Start taking on:  08/17/2014     loratadine 10 MG tablet  Commonly known as:  CLARITIN  Take 1 tablet (10 mg total) by mouth daily.     mupirocin cream 2 %  Commonly known as:  BACTROBAN  APPLY 1 APPLICATION TOPICALLY 2 (TWO) TIMES DAILY. FOR SKIN LESIONS     nadolol 20 MG tablet  Commonly known as:  CORGARD  TAKE 1/2 TABLET (10 MG TOTAL) BY MOUTH DAILY.     NON FORMULARY  3 liters oxygen 24/7     OCEAN NASAL SPRAY NA  Place 1 spray into the nose 3 (three) times daily as needed (congestion).     oxyCODONE 5 MG immediate release tablet  Commonly known as:  Oxy IR/ROXICODONE  Take one tablet by mouth every 4 hours as needed for pain     pantoprazole 40 MG tablet  Commonly known as:  PROTONIX  Take 1 tablet (40 mg total) by mouth 2 (two)  times daily.     polyethylene glycol packet  Commonly known as:  MIRALAX / GLYCOLAX  Take 17 g by mouth daily as needed for moderate constipation.     predniSONE 10 MG tablet  Commonly known as:  DELTASONE  - Prednisone dosing: Take  Prednisone 30mg  (3 tabs) x 3 days, then 20mg  (2 tabs) x 3days, then 10mg  (1 tab) x 3days, then OFF.  -   - Dispense:  Start taking on:  08/17/2014     PROAIR HFA 108 (90 BASE) MCG/ACT inhaler  Generic drug:  albuterol  Inhale 2 puffs into the lungs every 6 (six) hours as needed for wheezing or shortness of breath.     promethazine 25 MG tablet  Commonly known as:  PHENERGAN  Take 1 tablet (25 mg total) by mouth 2 (two) times daily as needed for nausea.     QUEtiapine 400 MG tablet  Commonly known as:  SEROQUEL  Take 400 mg by mouth at bedtime.     rOPINIRole 4 MG tablet  Commonly known as:  REQUIP  TAKE 1 TABLET BY MOUTH AT BEDTIME     simvastatin 40 MG tablet  Commonly known as:  ZOCOR  TAKE 1 TABLET BY MOUTH EVERY EVENING     spironolactone 25 MG tablet  Commonly known as:  ALDACTONE  TAKE 1/2 TABLET EVERY DAY     Vitamin D (Ergocalciferol) 50000 UNITS Caps capsule  Commonly known as:  DRISDOL  TAKE 1 CAPSULE (50,000 UNITS TOTAL) BY MOUTH EVERY 7 (SEVEN) DAYS. TAKE ON FRIDAY     XIFAXAN 550 MG Tabs tablet  Generic drug:  rifaximin  TAKE 1 TABLET (550 MG TOTAL) BY MOUTH DAILY.         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 She denied any productive cough, chest pain. In the ER patient was found to be short of breath which improved soon without any intervention however patient stated that when she tries to make any minimal movement she gets  winded. On exam, falls found to be mildly hypotensive and bradycardiac and slight worsening of renal function on  the bmet. Patient reported a couple of episodes of nausea, vomiting but no diarrhea. Chest x-ray was negative for any acute infiltrates.  Hospital Course:   SOB (shortness of breath): Multifactorial likely due to acute bronchitis with anemia, deconditioning, dehydration or diuretics, patient also has acute sinusitis which could cause shortness of breath. Patient was admitted for further workup. D-dimer was slightly elevated at 0.52, VQ scan was done which showed low probability for for pulmonary embolism. Patient had 2-D echo on 12/20 which showed EF of 60-65% with no regional wall motion abnormalities. Subsequently patient reported productive cough with greenish yellow sputum, started on IV levofloxacin, Claritin daily, saline nasal spray, prednisone. Chest x-ray showed mild interstitial edema, no infiltrates. Patient will be discharged on oral levofloxacin with prednisone taper. She has follow-up appointment with her pulmonologist in January.  Anemia, thrombocytopenia: Likely due to underlying cirrhosis however rule out occult bleeding -Anemia panel was within normal limits, likely anemia of chronic disease. FOBT is still pending. The patient was transfused 2 units packed RBCs on 08/15/14. Hemoglobin has been stable at 9.6 today.    Essential hypertension - Currently stable   Hepatic cirrhosis secondary to [facility] Creatinine function improving, restart Lasix, spironolactone and nadolol Continue lactulose, rifaximin main   Acute renal failure likely due to dehydration and diarrhea -Stable, patient can restart oral Lasix and spironolactone, BMET needs to be checked at the follow-up appointment.   Diabetes mellitus type 2, uncontrolled likely due to steroids - Continue insulin per home schedule, blood sugars will improve with tapering of prednisone   Chronic obstructive  pulmonary disease with acute exacerbation - Continue home inhalers  Day of Discharge BP 135/55 mmHg  Pulse 61  Temp(Src) 97.9 F (36.6 C) (Oral)  Resp 18  Ht 5\' 4"  (1.626 m)  Wt 99.837 kg (220 lb 1.6 oz)  BMI 37.76 kg/m2  SpO2 100%  Physical Exam: General: Alert and awake oriented x3 not in any acute distress. CVS: S1-S2 clear no murmur rubs or gallops Chest: Decreased breath sounds at the bases no wheezing or rhonchi Abdomen: soft nontender, nondistended, normal bowel sounds Extremities: no cyanosis, clubbing or edema noted bilaterally    The results of significant diagnostics from this hospitalization (including imaging, microbiology, ancillary and laboratory) are listed below for reference.    LAB RESULTS: Basic Metabolic Panel:  Recent Labs Lab 08/15/14 0402 08/16/14 0438  NA 134* 137  K 5.1 4.2  CL 102 105  CO2 21 24  GLUCOSE 216* 228*  BUN 32* 32*  CREATININE 1.36* 1.40*  CALCIUM 9.4 9.2   Liver Function Tests:  Recent Labs Lab 08/11/14 1755 08/12/14 0615  AST 33 32  ALT 29 26  ALKPHOS 124* 112  BILITOT 0.8 0.7  PROT 6.9 6.1  ALBUMIN 3.5 3.1*   No results for input(s): LIPASE, AMYLASE in the last 168 hours. No results for input(s): AMMONIA in the last 168 hours. CBC:  Recent Labs Lab 08/12/14 0615  08/15/14 0402 08/16/14 0438  WBC 2.5*  < > 3.0* 3.5*  NEUTROABS 1.8  --   --   --   HGB 8.3*  < > 7.6* 9.6*  HCT 25.8*  < > 24.1* 28.8*  MCV 101.2*  < > 100.4* 98.6  PLT 73*  < > 60* 67*  < > = values in this interval not displayed. Cardiac Enzymes:  Recent Labs Lab 08/11/14 0114 08/11/14 1755  TROPONINI <0.30 <0.30   BNP: Invalid input(s): POCBNP CBG:  Recent Labs Lab 08/16/14 0547  08/16/14 1121  GLUCAP 200* 171*    Significant Diagnostic Studies:  Dg Chest 2 View  08/11/2014   CLINICAL DATA:  Shortness of breath.  EXAM: CHEST  2 VIEW  COMPARISON:  March 28, 2014.  FINDINGS: Stable cardiomediastinal silhouette. Both lungs  are clear. No pneumothorax or pleural effusion is noted. The visualized skeletal structures are unremarkable.  IMPRESSION: No acute cardiopulmonary abnormality seen.   Electronically Signed   By: Sabino Dick M.D.   On: 08/11/2014 19:34   Nm Pulmonary Perf And Vent  08/12/2014   CLINICAL DATA:  Shortness of breath with mid back pain. History of COPD, congestive heart failure and chronic bronchitis. Renal failure. Initial encounter.  EXAM: NUCLEAR MEDICINE VENTILATION - PERFUSION LUNG SCAN  TECHNIQUE: Ventilation images were obtained in multiple projections using inhaled aerosol technetium 99 M DTPA. Perfusion images were obtained in multiple projections after intravenous injection of Tc-76m MAA.  RADIOPHARMACEUTICALS:  40.0 mCi Tc-55m DTPA aerosol and 6.0 mCi Tc-29m MAA  COMPARISON:  Radiographs 08/11/2014. Non contrast chest CT 03/25/2014.  FINDINGS: Ventilation: Limited examination demonstrates patchy clumping of the aerosol in the tracheobronchial tree and multiple peripheral ventilation defects consistent with obstructive lung disease.  Perfusion: The previous examination appears matched to the ventilatory study. There are multiple small perfusion defects bilaterally attributed to obstructive lung disease. No large or wedge-shaped perfusion defects are seen to suggest pulmonary embolism.  IMPRESSION: Low probability for acute pulmonary embolism. There is evidence of underlying chronic obstructive lung disease.   Electronically Signed   By: Camie Patience M.D.   On: 08/12/2014 13:50    2D ECHO:  Study Conclusions  - Left ventricle: The cavity size was normal. Wall thickness was increased in a pattern of mild LVH. Systolic function was normal. The estimated ejection fraction was in the range of 60% to 65%. Wall motion was normal; there were no regional wall motion abnormalities. Left ventricular diastolic function parameters were normal. - Aortic valve: There was mild regurgitation. -  Mitral valve: Calcified annulus. - Left atrium: The atrium was moderately dilated. - Right atrium: The atrium was mildly dilated. - Pulmonary arteries: PA peak pressure: 39 mm Hg (S).  Disposition and Follow-up: Discharge Instructions    (HEART FAILURE PATIENTS) Call MD:  Anytime you have any of the following symptoms: 1) 3 pound weight gain in 24 hours or 5 pounds in 1 week 2) shortness of breath, with or without a dry hacking cough 3) swelling in the hands, feet or stomach 4) if you have to sleep on extra pillows at night in order to breathe.    Complete by:  As directed      Diet Carb Modified    Complete by:  As directed      Increase activity slowly    Complete by:  As directed             DISPOSITION: home    DISCHARGE FOLLOW-UP Follow-up Information    Follow up with [facility].   Why:  Registered Nurse, Physical Therapy Services to start within 24-48 hours of discharge   Contact information:   [place] [address] [phone]       Follow up with Ria Bush, MD. Schedule an appointment as soon as possible for a visit in 10 days.   Specialty:  Family Medicine   Why:  for hospital follow-up   Contact information:   [address] [place] [address] [phone]  Follow up with Noralee Space, MD On 09/09/2014.   Specialty:  Pulmonary Disease   Why:  at 12:00 PM   Contact information:   [place] [address] [phone]        Time spent on Discharge: 40 mins  Signed:   Jennetta Flood M.D. [facility] 08/16/2014, 12:02 PM Pager: [phone]   Coding Query:   VQ scan showed low probability of pulmonary embolism hence PE ruled OUT.   Yaphet Smethurst M.D. [facility] 08/20/2014, 4:23 PM

## 2014-08-16 NOTE — Progress Notes (Signed)
CSW notified by MD this morning during progression rounds that patient would be d/c'd home today and requires EMS transport. CSW met with patient who confirmed same. Stated that she has a very supportive son and 2 caregivers at home. She has already been in contact with her caregiver who is aware of d/c and will be at the house when she returns home.  Patient states she will call her son. CSW discussed with RN-Shaquetta and EMS transport set up for 12:00 pick up. Patient will require 02 at 3L/min (she states she also uses 02 at home. Discussed referral for Home Health but patient continues to refuse this service.  Patient states "I already have a nurse to check on me"- [facility]). She declines interest in PT or any other home service. Will notify RNCM.  CSW will sign off as no further CSW needs identified.  Lorie Phenix. Pauline Good, [place]

## 2014-08-16 NOTE — Progress Notes (Signed)
PT Cancellation Note  Patient Details Name: [name] MRN: [id number] DOB: 12/09/1940   Cancelled Treatment:    Reason Eval/Treat Not Completed: Other (comment) (Pt for dc home and has all needed services at home. Pt wants to conserve energy for trip home.)   [facility] 08/16/2014, 11:31 AM

## 2014-08-16 NOTE — Progress Notes (Signed)
UR completed Mateja Dier K. Victoriana Aziz, RN, BSN, MSHL, CCM  08/16/2014 1:12 PM

## 2014-08-17 ENCOUNTER — Other Ambulatory Visit: Payer: Self-pay | Admitting: Family Medicine

## 2014-08-18 LAB — TYPE AND SCREEN
ABO/RH(D): O POS
Antibody Screen: POSITIVE
DAT, IgG: NEGATIVE
Donor AG Type: NEGATIVE
Donor AG Type: NEGATIVE
Unit division: 0
Unit division: 0
Unit division: 0

## 2014-08-21 ENCOUNTER — Other Ambulatory Visit: Payer: Self-pay | Admitting: Endocrinology

## 2014-08-21 ENCOUNTER — Other Ambulatory Visit: Payer: Self-pay | Admitting: Cardiovascular Disease

## 2014-08-22 ENCOUNTER — Other Ambulatory Visit: Payer: Self-pay | Admitting: *Deleted

## 2014-08-22 MED ORDER — INSULIN PEN NEEDLE 31G X 8 MM MISC: Status: DC

## 2014-08-22 NOTE — Telephone Encounter (Signed)
Rx(s) sent to pharmacy electronically. OV 09/08/14

## 2014-08-24 ENCOUNTER — Other Ambulatory Visit: Payer: Self-pay | Admitting: *Deleted

## 2014-08-24 ENCOUNTER — Telehealth: Payer: Self-pay | Admitting: Radiology

## 2014-08-24 ENCOUNTER — Ambulatory Visit (INDEPENDENT_AMBULATORY_CARE_PROVIDER_SITE_OTHER): Payer: Medicare Other | Admitting: Family Medicine

## 2014-08-24 ENCOUNTER — Encounter: Payer: Self-pay | Admitting: Family Medicine

## 2014-08-24 VITALS — BP 124/66 | HR 52 | Temp 98.2°F | Wt 219.5 lb

## 2014-08-24 DIAGNOSIS — I5032 Chronic diastolic (congestive) heart failure: Secondary | ICD-10-CM

## 2014-08-24 DIAGNOSIS — N179 Acute kidney failure, unspecified: Secondary | ICD-10-CM

## 2014-08-24 DIAGNOSIS — L97509 Non-pressure chronic ulcer of other part of unspecified foot with unspecified severity: Secondary | ICD-10-CM

## 2014-08-24 DIAGNOSIS — D509 Iron deficiency anemia, unspecified: Secondary | ICD-10-CM

## 2014-08-24 DIAGNOSIS — L97519 Non-pressure chronic ulcer of other part of right foot with unspecified severity: Secondary | ICD-10-CM

## 2014-08-24 DIAGNOSIS — J441 Chronic obstructive pulmonary disease with (acute) exacerbation: Secondary | ICD-10-CM

## 2014-08-24 DIAGNOSIS — E11621 Type 2 diabetes mellitus with foot ulcer: Secondary | ICD-10-CM

## 2014-08-24 DIAGNOSIS — E119 Type 2 diabetes mellitus without complications: Secondary | ICD-10-CM

## 2014-08-24 DIAGNOSIS — K5520 Angiodysplasia of colon without hemorrhage: Secondary | ICD-10-CM

## 2014-08-24 LAB — RENAL FUNCTION PANEL
Albumin: 3.3 g/dL — ABNORMAL LOW (ref 3.5–5.2)
BUN: 42 mg/dL — ABNORMAL HIGH (ref 6–23)
CO2: 31 mEq/L (ref 19–32)
Calcium: 8.8 mg/dL (ref 8.4–10.5)
Chloride: 97 mEq/L (ref 96–112)
Creatinine, Ser: 1.4 mg/dL — ABNORMAL HIGH (ref 0.4–1.2)
GFR: 39.39 mL/min — AB (ref >60.00)
GLUCOSE: 549 mg/dL — AB (ref 70–99)
POTASSIUM: 4.1 meq/L (ref 3.5–5.1)
Phosphorus: 3.7 mg/dL (ref 2.3–4.6)
SODIUM: 136 meq/L (ref 135–145)

## 2014-08-24 MED ORDER — GLIMEPIRIDE 1 MG PO TABS: 1.0000 mg | ORAL_TABLET | Freq: Every day | ORAL | Status: DC

## 2014-08-24 NOTE — Progress Notes (Signed)
Pre visit review using our clinic review tool, if applicable. No additional management support is needed unless otherwise documented below in the visit note. 

## 2014-08-24 NOTE — Telephone Encounter (Signed)
Noted. See results note.  

## 2014-08-24 NOTE — Telephone Encounter (Signed)
[facility] called a critical Glucose - 549, results given to Dr Danise Mina

## 2014-08-24 NOTE — Assessment & Plan Note (Signed)
Lisinopril restarted. Doing well on current lasix dose 40/20.

## 2014-08-24 NOTE — Assessment & Plan Note (Signed)
Unclear if type 1 or 2 diabetes. Not recently seen by endo. Will prescribe amaryl 1mg  to take for next week while finishing prednisone 2/2 uncontrolled cbg's.

## 2014-08-24 NOTE — Progress Notes (Addendum)
BP 124/66 mmHg  Pulse 52  Temp(Src) 98.2 F (36.8 C) (Oral)  Wt 219 lb 8 oz (99.565 kg)   CC: hosp f/u visit  Subjective:    Patient ID: [name], female    DOB: [birth year], 73 y.o.   MRN: [id number]  HPI: [name] is a 73 y.o. female presenting on 08/24/2014 for Follow-up and Blood Sugar Problem   Recent hospitalization with dyspnea thought related to acute COPD/bronchitis and acute CHF exacerbation. Treated with levofloxacin and prednisone course. Upon discharge, lisinopril was discontinued 2/2 ARF. She has since restarted. rec f/u BMP today. D dimer 0.52, VQ scan was low risk. 2D echo was stable. She received 2u pRBC.   Stable dyspnea. Minimal cough. No leg swelling. Lasix 40mg /20mg  working well. Finishing abx and prednisone courses. Sugars very elevated while on prednisone - 455 yesterday afternoon. She is compliant with lantus 80-100u qhs and novalog 15-30u at supper.  Lab Results  Component Value Date   HGBA1C 6.5* 08/13/2014   Noticed wound on sole of R foot that has scabbed over, no longer tender. Abdominal skin lesions are improved - dried up and scabbed over during hospitalization and IV abx.  Admit date: 08/11/2014 Discharge date: 08/16/2014 F/u phone call: not done. D/c Dx: [name] SOB (shortness of breath) likely due to acute bronchitis/COPD superimposed with acute on chronic CHF  . Chronic respiratory failure on 3 L O2 via nasal cannula at home  . Acute renal failure . Angiodysplasia of intestine . Essential hypertension . Hepatic cirrhosis . Bradycardia  Relevant past medical, surgical, family and social history reviewed and updated as indicated. Interim medical history since our last visit reviewed. Allergies and medications reviewed and updated.  Current Outpatient Prescriptions on File Prior to Visit  Medication Sig  . albuterol (PROAIR HFA) 108 (90 BASE) MCG/ACT inhaler Inhale 2 puffs into the lungs every 6 (six) hours as needed for wheezing or  shortness of breath.   . ALPRAZolam (XANAX) 1 MG tablet GIVE 1 TABLET BY MOUTH EVERY 8 HOURS AS NEEDED FOR ANXIETY  . b complex vitamins tablet Take 1 tablet by mouth daily.   [name] buPROPion (WELLBUTRIN XL) 150 MG 24 hr tablet Take 150 mg by mouth daily.  [name] desvenlafaxine (PRISTIQ) 100 MG 24 hr tablet Take 150 mg by mouth daily. Takes with a 50mg  tablet total of 150mg  daily  . desvenlafaxine (PRISTIQ) 50 MG 24 hr tablet Take 50 mg by mouth daily. Takes with a 100mg  tablet, total daily dose is 150mg   . diphenhydrAMINE-zinc acetate (BENADRYL) cream Apply topically 3 (three) times daily as needed for itching.  . Fluticasone-Salmeterol (ADVAIR) 250-50 MCG/DOSE AEPB Inhale 1 puff into the lungs 2 (two) times daily.  . furosemide (LASIX) 40 MG tablet Take 1 tablets (40 mg) in the morning and 1/2 tablet in the afternoon  . guaiFENesin (MUCINEX) 600 MG 12 hr tablet Take 600 mg by mouth 2 (two) times daily. 2 tablet by mouth once daily  . Insulin Glargine (LANTUS SOLOSTAR) 100 UNIT/ML Solostar Pen Inject 80 Units into the skin at bedtime. (Patient taking differently: Inject 100 Units into the skin at bedtime. )  . insulin lispro (HUMALOG) 100 UNIT/ML KiwkPen Inject 15-30 units into the skin at supper per sliding scale  . Insulin Pen Needle (B-D ULTRAFINE III SHORT PEN) 31G X 8 MM MISC Use to inject insulin 2 times daily as instructed.  . lactulose, encephalopathy, (CHRONULAC) 10 GM/15ML SOLN Take 30 mLs (20 g total) by mouth  2 (two) times daily. constipation  . levalbuterol (XOPENEX) 0.63 MG/3ML nebulizer solution Take 0.63 mg by nebulization every 8 (eight) hours as needed for shortness of breath.   . loratadine (CLARITIN) 10 MG tablet Take 1 tablet (10 mg total) by mouth daily.  . Multiple Vitamins-Minerals (CENTRUM SILVER PO) Take 1 tablet by mouth daily.   . mupirocin cream (BACTROBAN) 2 % APPLY 1 APPLICATION TOPICALLY 2 (TWO) TIMES DAILY. FOR SKIN LESIONS  . nadolol (CORGARD) 20 MG tablet TAKE 1/2 TABLET  (10 MG TOTAL) BY MOUTH DAILY.  . NON FORMULARY 3 liters oxygen 24/7  . oxyCODONE (OXY IR/ROXICODONE) 5 MG immediate release tablet Take one tablet by mouth every 4 hours as needed for pain  . pantoprazole (PROTONIX) 40 MG tablet Take 1 tablet (40 mg total) by mouth 2 (two) times daily.  . polyethylene glycol (MIRALAX / GLYCOLAX) packet Take 17 g by mouth daily as needed for moderate constipation.  . predniSONE (DELTASONE) 10 MG tablet Prednisone dosing: Take  Prednisone 30mg  (3 tabs) x 3 days, then 20mg  (2 tabs) x 3days, then 10mg  (1 tab) x 3days, then OFF.  Dispense:  . promethazine (PHENERGAN) 25 MG tablet Take 1 tablet (25 mg total) by mouth 2 (two) times daily as needed for nausea.  [name] QUEtiapine (SEROQUEL) 400 MG tablet Take 400 mg by mouth at bedtime.  [name] rOPINIRole (REQUIP) 4 MG tablet TAKE 1 TABLET BY MOUTH AT BEDTIME  . Saline (OCEAN NASAL SPRAY NA) Place 1 spray into the nose 3 (three) times daily as needed (congestion).   . simvastatin (ZOCOR) 40 MG tablet TAKE 1 TABLET BY MOUTH EVERY EVENING  . spironolactone (ALDACTONE) 25 MG tablet Take 0.5 tablets (12.5 mg total) by mouth daily.  . Vitamin D, Ergocalciferol, (DRISDOL) 50000 UNITS CAPS capsule TAKE 1 CAPSULE (50,000 UNITS TOTAL) BY MOUTH EVERY 7 (SEVEN) DAYS. TAKE ON FRIDAY  . XIFAXAN 550 MG TABS tablet TAKE 1 TABLET (550 MG TOTAL) BY MOUTH DAILY.   No current facility-administered medications on file prior to visit.    Review of Systems Per HPI unless specifically indicated above     Objective:    BP 124/66 mmHg  Pulse 52  Temp(Src) 98.2 F (36.8 C) (Oral)  Wt 219 lb 8 oz (99.565 kg)  Wt Readings from Last 3 Encounters:  08/24/14 219 lb 8 oz (99.565 kg)  08/16/14 220 lb 1.6 oz (99.837 kg)  08/01/14 223 lb (101.152 kg)    Physical Exam  Constitutional: She appears well-developed and well-nourished. No distress.  Morbidly obese in wheelchair  HENT:  Head: Normocephalic and atraumatic.  Mouth/Throat: Oropharynx is  clear and moist. No oropharyngeal exudate.  Eyes: Conjunctivae and EOM are normal. Pupils are equal, round, and reactive to light. No scleral icterus.  Cardiovascular: Normal rate, regular rhythm and intact distal pulses.   Murmur (3/6 SEM best at LUSB) heard. Pulmonary/Chest: Effort normal and breath sounds normal. No respiratory distress. She has no wheezes. She has no rales.  Musculoskeletal: She exhibits no edema.  R sole with scabbed ulcer 1cm in diameter  Skin: Skin is warm and dry. No rash noted.  Nursing note and vitals reviewed.  2D Echo 08/14/2014 - mild LVH, EF 60-65%, normal wall motion, mild AR, mod dilated LA, mildly dilated RA, peak PA pressure 74mmHg    Assessment & Plan:   Problem List Items Addressed This Visit    Diabetic foot ulcer    Discussed wound care. No evidence of infection currently. Update if  not improving as expected.    Relevant Medications      glimepiride (AMARYL) tablet   Diabetes mellitus type 2, controlled    Unclear if type 1 or 2 diabetes. Not recently seen by endo. Will prescribe amaryl 1mg  to take for next week while finishing prednisone 2/2 uncontrolled cbg's.    Relevant Medications      glimepiride (AMARYL) tablet   Chronic obstructive pulmonary disease with acute exacerbation    Recovering well after presumed COPD exacerbation. Finishes prednisone today.    Chronic diastolic CHF (congestive heart failure) (Chronic)    Lisinopril restarted. Doing well on current lasix dose 40/20.    Angiodysplasia of intestine    Received 2u pRBC during hospitalization.    Acute renal failure - Primary    Pt self restarted lisinopril - will check renal panel today. Cr 1.4 on discharge, 2.6 on admit, 1.1 prior to admission.    Relevant Orders      Renal function panel       Follow up plan: Return in about 3 months (around 11/23/2014), or as needed, for medicare wellness.

## 2014-08-24 NOTE — Assessment & Plan Note (Signed)
Discussed wound care. No evidence of infection currently. Update if not improving as expected.

## 2014-08-24 NOTE — Assessment & Plan Note (Signed)
Recovering well after presumed COPD exacerbation. Finishes prednisone today.

## 2014-08-24 NOTE — Patient Instructions (Addendum)
Add amaryl 1mg  daily with breakfast for next 10 days then stop. Let me know if persistently elevated sugars despite coming off prednisone. Good to see you today, call us with questions. Pneumonia shot next visit. Dressing change once daily with vaseline to sole of R foot. Let us know if not healing as expected. labwork today. Return in 3 months for medicare wellness visit

## 2014-08-24 NOTE — Assessment & Plan Note (Addendum)
Pt self restarted lisinopril - will check renal panel today. Cr 1.4 on discharge, 2.6 on admit, 1.1 prior to admission.

## 2014-08-24 NOTE — Assessment & Plan Note (Signed)
Received 2u pRBC during hospitalization.

## 2014-08-25 ENCOUNTER — Encounter: Payer: Self-pay | Admitting: Family Medicine

## 2014-08-25 ENCOUNTER — Telehealth: Payer: Self-pay | Admitting: *Deleted

## 2014-08-25 NOTE — Telephone Encounter (Signed)
Order for aranesp in your IN box for completion

## 2014-08-27 ENCOUNTER — Encounter: Payer: Self-pay | Admitting: Family Medicine

## 2014-08-27 DIAGNOSIS — Z7189 Other specified counseling: Secondary | ICD-10-CM | POA: Insufficient documentation

## 2014-08-29 ENCOUNTER — Emergency Department (HOSPITAL_COMMUNITY): Payer: Medicare Other

## 2014-08-29 ENCOUNTER — Telehealth: Payer: Self-pay | Admitting: *Deleted

## 2014-08-29 ENCOUNTER — Emergency Department (HOSPITAL_COMMUNITY)
Admission: EM | Admit: 2014-08-29 | Discharge: 2014-08-29 | Disposition: A | Discharge status: Home / Self Care | Payer: Medicare Other | Attending: Emergency Medicine | Admitting: Emergency Medicine

## 2014-08-29 ENCOUNTER — Encounter (HOSPITAL_COMMUNITY): Payer: Self-pay | Admitting: Cardiology

## 2014-08-29 DIAGNOSIS — Z87440 Personal history of urinary (tract) infections: Secondary | ICD-10-CM | POA: Insufficient documentation

## 2014-08-29 DIAGNOSIS — R0602 Shortness of breath: Secondary | ICD-10-CM

## 2014-08-29 DIAGNOSIS — Z87891 Personal history of nicotine dependence: Secondary | ICD-10-CM | POA: Diagnosis not present

## 2014-08-29 DIAGNOSIS — Z7951 Long term (current) use of inhaled steroids: Secondary | ICD-10-CM | POA: Diagnosis not present

## 2014-08-29 DIAGNOSIS — R079 Chest pain, unspecified: Secondary | ICD-10-CM

## 2014-08-29 DIAGNOSIS — Z8669 Personal history of other diseases of the nervous system and sense organs: Secondary | ICD-10-CM | POA: Insufficient documentation

## 2014-08-29 DIAGNOSIS — I10 Essential (primary) hypertension: Secondary | ICD-10-CM | POA: Diagnosis not present

## 2014-08-29 DIAGNOSIS — Z862 Personal history of diseases of the blood and blood-forming organs and certain disorders involving the immune mechanism: Secondary | ICD-10-CM | POA: Insufficient documentation

## 2014-08-29 DIAGNOSIS — F419 Anxiety disorder, unspecified: Secondary | ICD-10-CM | POA: Insufficient documentation

## 2014-08-29 DIAGNOSIS — R011 Cardiac murmur, unspecified: Secondary | ICD-10-CM | POA: Diagnosis not present

## 2014-08-29 DIAGNOSIS — F329 Major depressive disorder, single episode, unspecified: Secondary | ICD-10-CM | POA: Insufficient documentation

## 2014-08-29 DIAGNOSIS — K219 Gastro-esophageal reflux disease without esophagitis: Secondary | ICD-10-CM | POA: Diagnosis not present

## 2014-08-29 DIAGNOSIS — Z86010 Personal history of colonic polyps: Secondary | ICD-10-CM | POA: Insufficient documentation

## 2014-08-29 DIAGNOSIS — I5032 Chronic diastolic (congestive) heart failure: Secondary | ICD-10-CM | POA: Diagnosis not present

## 2014-08-29 DIAGNOSIS — R059 Cough, unspecified: Secondary | ICD-10-CM

## 2014-08-29 DIAGNOSIS — J441 Chronic obstructive pulmonary disease with (acute) exacerbation: Secondary | ICD-10-CM | POA: Insufficient documentation

## 2014-08-29 DIAGNOSIS — R05 Cough: Secondary | ICD-10-CM | POA: Diagnosis not present

## 2014-08-29 DIAGNOSIS — E780 Pure hypercholesterolemia: Secondary | ICD-10-CM | POA: Diagnosis not present

## 2014-08-29 DIAGNOSIS — Z8742 Personal history of other diseases of the female genital tract: Secondary | ICD-10-CM | POA: Insufficient documentation

## 2014-08-29 DIAGNOSIS — I2510 Atherosclerotic heart disease of native coronary artery without angina pectoris: Secondary | ICD-10-CM | POA: Diagnosis not present

## 2014-08-29 DIAGNOSIS — M1990 Unspecified osteoarthritis, unspecified site: Secondary | ICD-10-CM | POA: Insufficient documentation

## 2014-08-29 DIAGNOSIS — Z8701 Personal history of pneumonia (recurrent): Secondary | ICD-10-CM | POA: Diagnosis not present

## 2014-08-29 DIAGNOSIS — Z880 Allergy status to penicillin: Secondary | ICD-10-CM | POA: Insufficient documentation

## 2014-08-29 DIAGNOSIS — Z794 Long term (current) use of insulin: Secondary | ICD-10-CM | POA: Diagnosis not present

## 2014-08-29 LAB — BASIC METABOLIC PANEL
ANION GAP: 7 (ref 5–15)
BUN: 31 mg/dL — ABNORMAL HIGH (ref 6–23)
CALCIUM: 8.7 mg/dL (ref 8.4–10.5)
CHLORIDE: 101 meq/L (ref 96–112)
CO2: 29 mmol/L (ref 19–32)
Creatinine, Ser: 1.11 mg/dL — ABNORMAL HIGH (ref 0.50–1.10)
GFR calc non Af Amer: 49 mL/min — ABNORMAL LOW (ref >90)
GFR, EST AFRICAN AMERICAN: 57 mL/min — AB (ref >90)
GLUCOSE: 190 mg/dL — AB (ref 70–99)
POTASSIUM: 3.7 mmol/L (ref 3.5–5.1)
SODIUM: 137 mmol/L (ref 135–145)

## 2014-08-29 LAB — URINALYSIS, ROUTINE W REFLEX MICROSCOPIC
Bilirubin Urine: NEGATIVE
GLUCOSE, UA: NEGATIVE mg/dL
Hgb urine dipstick: NEGATIVE
KETONES UR: NEGATIVE mg/dL
NITRITE: POSITIVE — AB
PROTEIN: NEGATIVE mg/dL
Specific Gravity, Urine: 1.01 (ref 1.005–1.030)
Urobilinogen, UA: 2 mg/dL — ABNORMAL HIGH (ref 0.0–1.0)
pH: 6.5 (ref 5.0–8.0)

## 2014-08-29 LAB — TYPE AND SCREEN
ABO/RH(D): O POS
Antibody Screen: POSITIVE
DAT, IgG: NEGATIVE

## 2014-08-29 LAB — CBC
HCT: 32.3 % — ABNORMAL LOW (ref 36.0–46.0)
Hemoglobin: 10.6 g/dL — ABNORMAL LOW (ref 12.0–15.0)
MCH: 32.6 pg (ref 26.0–34.0)
MCHC: 32.8 g/dL (ref 30.0–36.0)
MCV: 99.4 fL (ref 78.0–100.0)
PLATELETS: 62 10*3/uL — AB (ref 150–400)
RBC: 3.25 MIL/uL — ABNORMAL LOW (ref 3.87–5.11)
RDW: 13.6 % (ref 11.5–15.5)
WBC: 4 10*3/uL (ref 4.0–10.5)

## 2014-08-29 LAB — I-STAT TROPONIN, ED: Troponin i, poc: 0.01 ng/mL (ref 0.00–0.08)

## 2014-08-29 LAB — BRAIN NATRIURETIC PEPTIDE: B Natriuretic Peptide: 233 pg/mL — ABNORMAL HIGH (ref 0.0–100.0)

## 2014-08-29 LAB — URINE MICROSCOPIC-ADD ON

## 2014-08-29 MED ORDER — IPRATROPIUM-ALBUTEROL 0.5-2.5 (3) MG/3ML IN SOLN
3.0000 mL | Freq: Once | RESPIRATORY_TRACT | Status: AC
  Administered 2014-08-29: 3 mL via RESPIRATORY_TRACT
  Filled 2014-08-29: qty 3

## 2014-08-29 MED ORDER — CEPHALEXIN 500 MG PO CAPS: 500.0000 mg | ORAL_CAPSULE | Freq: Two times a day (BID) | ORAL | Status: DC

## 2014-08-29 NOTE — Telephone Encounter (Signed)
Filled and placed in Kim's box. 

## 2014-08-29 NOTE — ED Notes (Signed)
Patient transported to X-ray 

## 2014-08-29 NOTE — ED Notes (Signed)
Bladder scan done, <39ml present. PA made aware.

## 2014-08-29 NOTE — Telephone Encounter (Signed)
Order for Procrit in your IN box for signature/approval. Please return to me. Thanks!

## 2014-08-29 NOTE — ED Notes (Signed)
Pt reports that she has become more SOB over the past couple of days. States she also has problems with her kidneys and she has been able to urinate well. Pt states she has gained 6 lbs in a couple of days Pt on home O2.

## 2014-08-29 NOTE — ED Notes (Signed)
PT returned to bed. Monitored by pulse ox, bp cuff, and 5-lead.

## 2014-08-29 NOTE — ED Notes (Addendum)
Pt states she was dx with renal failure the last time she was in the hospital in late December, has been having lessened urine output since before that. C/O sob that has been increasing despite her continued 3L of 02 at home and productive cough since Wednesday with approx a 6 ib weight gain over the past few days. Pt denies pain at this time, alert, oriented, NAD. Respirations equal and unlabored, 02 sat 93 on room air before 02 applied.

## 2014-08-29 NOTE — ED Provider Notes (Signed)
CSN: [id number]     Arrival date & time 08/29/14  1757 History   First MD Initiated Contact with Patient 08/29/14 1853     Chief Complaint  Patient presents with  . Shortness of Breath     (Consider location/radiation/quality/duration/timing/severity/associated sxs/prior Treatment) Patient is a 74 y.o. female presenting with shortness of breath. The history is provided by the patient and medical records.  Shortness of Breath   This is a 74 year old female with past medical history significant for hypertension, diabetes, congestive heart failure, COPD, coronary artery disease, aortic stenosis, chronic iron deficiency anemia on procrit shots, presenting to the ED for SOB.  Patient is oxygen dependent at home, 3L at baseline.  States over the past few days she has felt increasingly SOB.  She has also had a productive cough with thick, white sputum.  No fever, chills, sweats.  States occasional chest pains, none currently.  Weight is up 6lbs, denies leg swelling-- states when she retains fluid it usually goes into her abdomen.  Patient also notes decrease urine output at home.  She was admitted to hospital last month for AKI and is concerned for the same.  Patient states she feels the need to urine, sits down on the toilet but is only able to produce a few drops at a time.  Denies dysuria but states her urine feels "warm".  No abdominal pain, nausea, vomiting, diarrhea.  Patient was on Levaquin for prior UTI.  VS stable on arrival.  Past Medical History  Diagnosis Date  . Chronic airway obstruction, not elsewhere classified   . Unspecified chronic bronchitis   . Unspecified essential hypertension   . Non-obstructive CAD   . Palpitations   . Mild aortic stenosis     a. 03/2014 Valve area (VTI): 2.89 cm^2, Valve area (Vmax): 2.7 cm^2.  . Pure hypercholesterolemia   . Morbid obesity   . Esophageal reflux   . 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 Diverticulosis of  colon (without mention of hemorrhage)   . Benign neoplasm of colon   . Autoimmune hepatitis   . Cystitis, unspecified   . Osteoarthrosis, unspecified whether generalized or localized, unspecified site   . Osteoporosis, unspecified   . Restless legs syndrome (RLS)   . Major depressive disorder, recurrent episode, severe, specified as with psychotic behavior   . Iron deficiency anemia secondary to blood loss (chronic)     a. frequent PRBC transfusions.  . Coarse tremors     a. arms.  . Carpal tunnel syndrome on both sides   . Palpitations   . Chronic diastolic CHF (congestive heart failure)     a. 03/26/2014 Echo: EF 55-60%, no rwma, mild AS/AI, mod-sev Ca2+ MV annulus, mildly to mod dil LA.  [name] Asthma   . OSA (obstructive sleep apnea)     a. does not use CPAP.  [name] Type II diabetes mellitus   . H/O hiatal hernia   . Cirrhosis     a. dx'd 1990's  . Adenomatous colon polyp   . Midsternal chest pain     a. conservatively managed ->poor candidate for cath/anticoagulation.  [name] Anxiety   . History of pneumonia   . AVM (arteriovenous malformation)   . Portal hypertensive gastropathy   . Falls frequently     completed HHPT/OT 06/2014, PT unmet goals  . UTI (lower urinary tract infection)   . Hiatal hernia   . GAVE (gastric antral vascular ectasia)   .  Nonalcoholic steatohepatitis (NASH)   . Thrombocytopenia   . Protein calorie malnutrition    Past Surgical History  Procedure Laterality Date  . Hemorroidectomy    . Appendectomy    . Vaginal hysterectomy    . Breast biopsy      bilateral  . Cesarean section      x 3  . Appendectomy    . Esophagogastroduodenoscopy  06/12/2012    Procedure: ESOPHAGOGASTRODUODENOSCOPY (EGD);  Surgeon: Lafayette Dragon, MD;  Location: [facility];  Service: Endoscopy;  Laterality: N/A;  . Balloon dilation  06/12/2012    Procedure: BALLOON DILATION;  Surgeon: Lafayette Dragon, MD;  Location: [facility];  Service: Endoscopy;  Laterality: N/A;  ?balloon  .  Esophagogastroduodenoscopy  09/10/2012    Procedure: ESOPHAGOGASTRODUODENOSCOPY (EGD);  Surgeon: Lafayette Dragon, MD;  Location: [facility];  Service: Endoscopy;  Laterality: N/A;  . Hot hemostasis  09/10/2012    Procedure: HOT HEMOSTASIS (ARGON PLASMA COAGULATION/BICAP);  Surgeon: Lafayette Dragon, MD;  Location: [facility];  Service: Endoscopy;  Laterality: N/A;  . Esophagogastroduodenoscopy N/A 01/18/2013    Procedure: ESOPHAGOGASTRODUODENOSCOPY (EGD);  Surgeon: Lafayette Dragon, MD;  Location: [facility] END[facility]COPY;  Service: Endoscopy;  Laterality: N/A;  . Esophagogastroduodenoscopy N/A 05/24/2013    Procedure: ESOPHAGOGASTRODUODENOSCOPY (EGD);  Surgeon: Jerene Bears, MD;  Location: [place];  Service: Gastroenterology;  Laterality: N/A;  . Hot hemostasis N/A 05/24/2013    Procedure: HOT HEMOSTASIS (ARGON PLASMA COAGULATION/BICAP);  Surgeon: Jerene Bears, MD;  Location: [place];  Service: Gastroenterology;  Laterality: N/A;  . US echocardiography  07/2014    mild LVH, EF 60-65%, normal wall motion, mild AR, mod dilated LA, mildly dilated RA, peak PA pressure 67mmHg   Family History  Problem Relation Age of Onset  . Heart disease Mother   . Cervical cancer Mother   . Kidney disease Mother   . Diabetes Mother   . Breast cancer Sister   . Multiple sclerosis Sister   . Colon cancer Neg Hx   . Breast cancer Maternal Aunt     x 2  . Diabetes Father   . Diabetes Sister   . Multiple sclerosis Other    History  Substance Use Topics  . Smoking status: Former Smoker -- 1.50 packs/day for 25 years    Types: Cigarettes    Quit date: 08/26/1992  . Smokeless tobacco: Never Used  . Alcohol Use: No     Comment: 2/68/34 "last alcoholic drink was years ago"   OB History    No data available     Review of Systems  Respiratory: Positive for shortness of breath.   Genitourinary:       Decreased urine output  All other systems reviewed and are negative.     Allergies  Acetaminophen; Aspirin;  Theophylline; and Penicillins  Home Medications   Prior to Admission medications   Medication Sig Start Date End Date Taking? Authorizing Provider  albuterol (PROAIR HFA) 108 (90 BASE) MCG/ACT inhaler Inhale 2 puffs into the lungs every 6 (six) hours as needed for wheezing or shortness of breath.     Historical Provider, MD  ALPRAZolam Duanne Moron) 1 MG tablet GIVE 1 TABLET BY MOUTH EVERY 8 HOURS AS NEEDED FOR ANXIETY 05/13/14   Ria Bush, MD  b complex vitamins tablet Take 1 tablet by mouth daily.     Historical Provider, MD  buPROPion (WELLBUTRIN XL) 150 MG 24 hr tablet Take 150 mg by mouth daily.    Historical Provider, MD  darbepoetin (ARANESP) 200 MCG/0.4ML SOLN injection Inject 200 mcg into the skin every 14 (fourteen) days.    Historical Provider, MD  desvenlafaxine (PRISTIQ) 100 MG 24 hr tablet Take 150 mg by mouth daily. Takes with a 50mg  tablet total of 150mg  daily    Historical Provider, MD  desvenlafaxine (PRISTIQ) 50 MG 24 hr tablet Take 50 mg by mouth daily. Takes with a 100mg  tablet, total daily dose is 150mg     Historical Provider, MD  diphenhydrAMINE-zinc acetate (BENADRYL) cream Apply topically 3 (three) times daily as needed for itching. 08/16/14   Ripudeep Krystal Eaton, MD  Fluticasone-Salmeterol (ADVAIR) 250-50 MCG/DOSE AEPB Inhale 1 puff into the lungs 2 (two) times daily. 07/26/14   Noralee Space, MD  furosemide (LASIX) 40 MG tablet Take 1 tablets (40 mg) in the morning and 1/2 tablet in the afternoon 03/31/14   Belkys A Regalado, MD  glimepiride (AMARYL) 1 MG tablet Take 1 tablet (1 mg total) by mouth daily with breakfast. 08/24/14   Ria Bush, MD  guaiFENesin (MUCINEX) 600 MG 12 hr tablet Take 600 mg by mouth 2 (two) times daily. 2 tablet by mouth once daily    Historical Provider, MD  Insulin Glargine (LANTUS SOLOSTAR) 100 UNIT/ML Solostar Pen Inject 80 Units into the skin at bedtime. Patient taking differently: Inject 100 Units into the skin at bedtime.  08/17/14   Ria Bush, MD  insulin lispro (HUMALOG) 100 UNIT/ML KiwkPen Inject 15-30 units into the skin at supper per sliding scale 08/17/14   Ria Bush, MD  Insulin Pen Needle (B-D ULTRAFINE III SHORT PEN) 31G X 8 MM MISC Use to inject insulin 2 times daily as instructed. 08/22/14   Renato Shin, MD  lactulose, encephalopathy, (CHRONULAC) 10 GM/15ML SOLN Take 30 mLs (20 g total) by mouth 2 (two) times daily. constipation 07/14/14   Lori P Hvozdovic, PA-C  levalbuterol (XOPENEX) 0.63 MG/3ML nebulizer solution Take 0.63 mg by nebulization every 8 (eight) hours as needed for shortness of breath.  06/05/14   Historical Provider, MD  loratadine (CLARITIN) 10 MG tablet Take 1 tablet (10 mg total) by mouth daily. 08/16/14   Ripudeep Krystal Eaton, MD  Multiple Vitamins-Minerals (CENTRUM SILVER PO) Take 1 tablet by mouth daily.     Historical Provider, MD  mupirocin cream (BACTROBAN) 2 % APPLY 1 APPLICATION TOPICALLY 2 (TWO) TIMES DAILY. FOR SKIN LESIONS 07/22/14   Ria Bush, MD  nadolol (CORGARD) 20 MG tablet TAKE 1/2 TABLET (10 MG TOTAL) BY MOUTH DAILY. 08/04/14   Blane Ohara, MD  NON FORMULARY 3 liters oxygen 24/7    Historical Provider, MD  oxyCODONE (OXY IR/ROXICODONE) 5 MG immediate release tablet Take one tablet by mouth every 4 hours as needed for pain 07/22/14   Ria Bush, MD  pantoprazole (PROTONIX) 40 MG tablet Take 1 tablet (40 mg total) by mouth 2 (two) times daily. 07/07/14   Lori P Hvozdovic, PA-C  polyethylene glycol (MIRALAX / GLYCOLAX) packet Take 17 g by mouth daily as needed for moderate constipation.    Historical Provider, MD  predniSONE (DELTASONE) 10 MG tablet Prednisone dosing: Take  Prednisone 30mg  (3 tabs) x 3 days, then 20mg  (2 tabs) x 3days, then 10mg  (1 tab) x 3days, then OFF.  Dispense: 08/17/14   Ripudeep Krystal Eaton, MD  promethazine (PHENERGAN) 25 MG tablet Take 1 tablet (25 mg total) by mouth 2 (two) times daily as needed for nausea. 06/09/14   Ria Bush, MD   QUEtiapine (SEROQUEL) 400 MG tablet  Take 400 mg by mouth at bedtime.    Historical Provider, MD  rOPINIRole (REQUIP) 4 MG tablet TAKE 1 TABLET BY MOUTH AT BEDTIME 08/01/14   Tiffany L Reed, DO  Saline (OCEAN NASAL SPRAY NA) Place 1 spray into the nose 3 (three) times daily as needed (congestion).     Historical Provider, MD  simvastatin (ZOCOR) 40 MG tablet TAKE 1 TABLET BY MOUTH EVERY EVENING 08/08/14   Tiffany L Reed, DO  spironolactone (ALDACTONE) 25 MG tablet Take 0.5 tablets (12.5 mg total) by mouth daily. 08/22/14   Blane Ohara, MD  Vitamin D, Ergocalciferol, (DRISDOL) 50000 UNITS CAPS capsule TAKE 1 CAPSULE (50,000 UNITS TOTAL) BY MOUTH EVERY 7 (SEVEN) DAYS. TAKE ON FRIDAY 05/16/14   Ria Bush, MD  XIFAXAN 550 MG TABS tablet TAKE 1 TABLET (550 MG TOTAL) BY MOUTH DAILY. 07/11/14   Lafayette Dragon, MD   BP 122/99 mmHg  Pulse 46  Temp(Src) 98.7 F (37.1 C) (Oral)  Resp 12  SpO2 98%   Physical Exam  Constitutional: She is oriented to person, place, and time. She appears well-developed and well-nourished. No distress.  3L via [place], NAD  HENT:  Head: Normocephalic and atraumatic.  Mouth/Throat: Oropharynx is clear and moist.  Eyes: Conjunctivae and EOM are normal. Pupils are equal, round, and reactive to light.  Neck: Normal range of motion. Neck supple.  Cardiovascular: Normal rate and regular rhythm.   Murmur heard.  Systolic murmur is present  Murmur consistent with AS  Pulmonary/Chest: Effort normal and breath sounds normal. No respiratory distress. She has no wheezes.  Respirations unlabored, speaking in full complete sentences without difficulty  Abdominal: Soft. Bowel sounds are normal. There is no tenderness. There is no guarding.  Musculoskeletal: Normal range of motion.  No pitting edema or calf asymmetry  Neurological: She is alert and oriented to person, place, and time.  Skin: Skin is warm and dry. She is not diaphoretic.  Psychiatric: She has a normal mood and  affect.  Nursing note and vitals reviewed.   ED Course  Procedures (including critical care time) Labs Review Labs Reviewed  CBC - Abnormal; Notable for the following:    RBC 3.25 (*)    Hemoglobin 10.6 (*)    HCT 32.3 (*)    All other components within normal limits  BASIC METABOLIC PANEL - Abnormal; Notable for the following:    Glucose, Bld 190 (*)    BUN 31 (*)    Creatinine, Ser 1.11 (*)    GFR calc non Af Amer 49 (*)    GFR calc Af Amer 57 (*)    All other components within normal limits  BRAIN NATRIURETIC PEPTIDE - Abnormal; Notable for the following:    B Natriuretic Peptide 233.0 (*)    All other components within normal limits  URINALYSIS, ROUTINE W REFLEX MICROSCOPIC  I-STAT TROPOININ, ED  TYPE AND SCREEN    Imaging Review Dg Chest 2 View  08/29/2014   CLINICAL DATA:  Initial encounter for 1 day history of shortness of breath.  EXAM: CHEST  2 VIEW  COMPARISON:  08/13/2014  FINDINGS: Lungs are hyperexpanded. No edema or focal airspace consolidation. No pleural effusion. Interstitial markings are diffusely coarsened with chronic features. Cardiopericardial silhouette is at upper limits of normal for size. Imaged bony structures of the thorax are intact.  IMPRESSION: No acute cardiopulmonary findings.   Electronically Signed   By: Misty Stanley M.D.   On: 08/29/2014 18:53  EKG Interpretation   Date/Time:  Monday August 29 2014 18:14:11 EST Ventricular Rate:  56 PR Interval:    QRS Duration: 96 QT Interval:  442 QTC Calculation: 426 R Axis:   19 Text Interpretation:  INDETERMINATE RHYTHM Septal infarct , age  undetermined Marked ST abnormality, possible inferior subendocardial  injury Abnormal ECG ARTIFACT NOTED Confirmed by Christy Gentles  MD, DONALD  [phone]) on 08/29/2014 6:41:01 PM      MDM   Final diagnoses:  SOB (shortness of breath)  Cough   74 year old female with shortness of breath. She is oxygen dependent, 3 L at baseline. On exam, respirations  unlabored without audible wheezes or rhonchi. She is in no acute distress, speaking in full complete sentences without difficulty on her baseline 3 L.  EKG sinus rhythm with ST abnormalities, unchanged from previous. Lab work is reassuring-- troponin negative, anemia and SrCr improved from previous.  BNP mildly elevated at 233, unsure of baseline.  CXR without acute infiltrate or pulmonary vascular congestion/edema.  Patient's bladder scan with <60 mL retained-- does not appears to be urinary retention.  Patient has been able to urinate in the ED, u/a nitrite +.  Urine culture pending.  Patient has remained hemodynamically stable, has not displayed any signs of respiratory distress, and vital signs are stable on her baseline 3 L. At this time, do not feel she need hospital admission. Patient was started on Keflex for her UTI. I've encouraged her to follow-up with her primary care physician.  Discussed plan with patient, he/she acknowledged understanding and agreed with plan of care.  Return precautions given for new or worsening symptoms.  Case discussed with attending physician, Dr. Christy Gentles, who evaluated patient and agrees with assessment and plan of care.  Larene Pickett, PA-C 08/29/14 2244  Sharyon Cable, MD 08/30/14 [phone]

## 2014-08-29 NOTE — Telephone Encounter (Signed)
Completed form faxed to [facility] Short Stay as instructed.

## 2014-08-29 NOTE — ED Notes (Signed)
Pa  at bedside. 

## 2014-08-29 NOTE — Discharge Instructions (Signed)
Take the prescribed medication as directed. °Follow-up with your primary care physician. °Return to the ED for new or worsening symptoms. ° °

## 2014-09-01 ENCOUNTER — Encounter: Payer: Self-pay | Admitting: *Deleted

## 2014-09-01 ENCOUNTER — Encounter (HOSPITAL_COMMUNITY)
Admission: RE | Admit: 2014-09-01 | Discharge: 2014-09-01 | Disposition: A | Discharge status: Home / Self Care | Payer: Medicare Other | Source: Ambulatory Visit | Attending: Pulmonary Disease | Admitting: Pulmonary Disease

## 2014-09-01 ENCOUNTER — Other Ambulatory Visit: Payer: Self-pay | Admitting: *Deleted

## 2014-09-01 DIAGNOSIS — K7460 Unspecified cirrhosis of liver: Secondary | ICD-10-CM | POA: Insufficient documentation

## 2014-09-01 DIAGNOSIS — N289 Disorder of kidney and ureter, unspecified: Secondary | ICD-10-CM | POA: Diagnosis not present

## 2014-09-01 DIAGNOSIS — D509 Iron deficiency anemia, unspecified: Secondary | ICD-10-CM

## 2014-09-01 DIAGNOSIS — D649 Anemia, unspecified: Secondary | ICD-10-CM | POA: Diagnosis not present

## 2014-09-01 DIAGNOSIS — Q2733 Arteriovenous malformation of digestive system vessel: Secondary | ICD-10-CM | POA: Diagnosis not present

## 2014-09-01 LAB — URINE CULTURE: Colony Count: >=100000

## 2014-09-01 LAB — POCT HEMOGLOBIN-HEMACUE: Hemoglobin: 9 g/dL — ABNORMAL LOW (ref 12.0–15.0)

## 2014-09-01 MED ORDER — DARBEPOETIN ALFA 200 MCG/0.4ML IJ SOSY
200.0000 ug | PREFILLED_SYRINGE | INTRAMUSCULAR | Status: DC
  Administered 2014-09-01: 200 ug via SUBCUTANEOUS

## 2014-09-01 MED ORDER — DARBEPOETIN ALFA 200 MCG/0.4ML IJ SOSY
PREFILLED_SYRINGE | INTRAMUSCULAR | Status: AC
  Filled 2014-09-01: qty 0.4

## 2014-09-02 ENCOUNTER — Telehealth (HOSPITAL_COMMUNITY): Payer: Self-pay

## 2014-09-02 ENCOUNTER — Telehealth (HOSPITAL_COMMUNITY): Payer: Self-pay | Admitting: *Deleted

## 2014-09-02 NOTE — Telephone Encounter (Signed)
Spoke with pt about past due Breast Cancer screening, admits she did not have it done. Noted need on her PCP visit for 09/09/2014.

## 2014-09-02 NOTE — Telephone Encounter (Signed)
Post ED Visit - Positive Culture Follow-up  Culture report reviewed by antimicrobial stewardship pharmacist: []  Wes Elberon, Pharm.D., BCPS []  Heide Guile, Pharm.D., BCPS []  Alycia Rossetti, Pharm.D., BCPS []  [place], Pharm.D., BCPS, AAHIVP [x]  Legrand [place]o, Pharm.D., BCPS, AAHIVP []  Isac Sarna, Pharm.D., BCPS  Positive Urine culture, >/= 100,000 colonies -> E Coli Treated with Cephalexin, organism sensitive to the same and no further patient follow-up is required at this time.  Dortha Kern 09/02/2014, 12:07 PM

## 2014-09-03 DIAGNOSIS — J449 Chronic obstructive pulmonary disease, unspecified: Secondary | ICD-10-CM | POA: Diagnosis not present

## 2014-09-05 ENCOUNTER — Other Ambulatory Visit: Payer: Self-pay | Admitting: Family Medicine

## 2014-09-05 NOTE — Telephone Encounter (Signed)
plz phone in. 

## 2014-09-05 NOTE — Telephone Encounter (Signed)
Ok to refill 

## 2014-09-06 NOTE — Telephone Encounter (Signed)
Rx called in as directed.   

## 2014-09-07 DIAGNOSIS — J449 Chronic obstructive pulmonary disease, unspecified: Secondary | ICD-10-CM | POA: Diagnosis not present

## 2014-09-08 ENCOUNTER — Encounter (HOSPITAL_COMMUNITY): Payer: Self-pay | Admitting: Gastroenterology

## 2014-09-08 ENCOUNTER — Ambulatory Visit (INDEPENDENT_AMBULATORY_CARE_PROVIDER_SITE_OTHER): Payer: Medicare Other | Admitting: Cardiovascular Disease

## 2014-09-08 VITALS — BP 150/62 | HR 52 | Ht 64.0 in | Wt 223.8 lb

## 2014-09-08 DIAGNOSIS — I10 Essential (primary) hypertension: Secondary | ICD-10-CM | POA: Diagnosis not present

## 2014-09-08 DIAGNOSIS — I5032 Chronic diastolic (congestive) heart failure: Secondary | ICD-10-CM | POA: Diagnosis not present

## 2014-09-08 DIAGNOSIS — R001 Bradycardia, unspecified: Secondary | ICD-10-CM | POA: Diagnosis not present

## 2014-09-08 NOTE — Patient Instructions (Signed)
Your physician recommends that you schedule a follow-up appointment in: 2 MONTHS with Richardson Dopp PA-C  Please follow instructions for as needed diuretics if your weight is elevated from dry weight of 218 lbs. If your weight increases by 3 pounds in a 24 hour period or 5 pounds above baseline please increase Furosemide to 80mg  in the morning and 40mg  in the evening. You can follow these instructions for at least 2 days until your weight has returned to baseline or contact our office for further instructions if weight has not decreased.

## 2014-09-08 NOTE — Progress Notes (Signed)
CARDIOLOGY OFFICE NOTE Date:  09/08/2014   ID:  [name], DOB 03/30/1941, MRN [id number]  PCP:  Ria Bush, MD   Chief Complaint  Patient presents with  . Coronary Artery Disease    EDEMA and SOB  . Hypertension     HISTORY OF PRESENT ILLNESS: [name] is a 74 y.o. female who presents today for cardiology evaluation.   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 LHC 11/2007: Dx 20%, proximal and mid RCA 20-30%, EF 50-55%. Echo 01/2013: EF 53-61%, grade 2 diastolic dysfunction, very mild aortic stenosis, mean gradient 9, mild AI, mild BAE, PASP 39. She was hospitalized twice in 2014 with exacerbations of diastolic heart failure. Also noted to have aggressive anemia requiring blood transfusions.  Continues to have problems with breathing. Attributed to COPD and diastolic heart failure. She's on home O2.  She does not report any recent change in her breathing, but she remains quite limited. She is short of breath with low-level activities. She has very brief fleeting chest pains only last a few seconds. She has no substernal pain or pressure and she denies any exertional symptoms. However, she is quite sedentary. She has no leg swelling, but does notice swelling in her abdomen when her heart failure symptoms are worse. She denies lightheadedness or syncope. No recent bleeding.   Past Medical History  Diagnosis Date  . Chronic airway obstruction, not elsewhere classified   . Unspecified chronic bronchitis   . Unspecified essential hypertension   . Non-obstructive CAD   . Palpitations   . Mild aortic stenosis     a. 03/2014 Valve area (VTI): 2.89 cm^2, Valve area (Vmax): 2.7 cm^2.  . Pure hypercholesterolemia   . Morbid obesity   . Esophageal reflux   . 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 Diverticulosis of colon (without mention of hemorrhage)   . Benign neoplasm of colon   . Autoimmune hepatitis   . Cystitis, unspecified   . Osteoarthrosis, unspecified whether generalized or localized, unspecified site   . Osteoporosis, unspecified   . Restless legs syndrome (RLS)   . Major depressive disorder, recurrent episode, severe, specified as with psychotic behavior   . Iron deficiency anemia secondary to blood loss (chronic)     a. frequent PRBC transfusions.  . Coarse tremors     a. arms.  . Carpal tunnel syndrome on both sides   . Palpitations   . Chronic diastolic CHF (congestive heart failure)     a. 03/26/2014 Echo: EF 55-60%, no rwma, mild AS/AI, mod-sev Ca2+ MV annulus, mildly to mod dil LA.  [name] Asthma   . OSA (obstructive sleep apnea)     a. does not use CPAP.  [name] Type II diabetes mellitus   . H/O hiatal hernia   . Cirrhosis     a. dx'd 1990's  . Adenomatous colon polyp   . Midsternal chest pain     a. conservatively managed ->poor candidate for cath/anticoagulation.  [name] Anxiety   . History of pneumonia   . AVM (arteriovenous malformation)   . Portal hypertensive gastropathy   . Falls frequently     completed HHPT/OT 06/2014, PT unmet goals  . UTI (lower urinary tract infection)   . Hiatal hernia   . GAVE (gastric antral vascular ectasia)   .  Nonalcoholic steatohepatitis (NASH)   . Thrombocytopenia   . Protein calorie malnutrition     Current Outpatient Prescriptions  Medication Sig Dispense Refill  . albuterol (PROAIR HFA) 108 (90 BASE) MCG/ACT inhaler Inhale 2 puffs into the lungs every 6 (six) hours as needed for wheezing or shortness of breath.     . ALPRAZolam (XANAX) 1 MG tablet TAKE 1 TABLET EVERY 8 HOURS AS NEEDED FOR ANXIETY 30 tablet 1  . b complex vitamins tablet Take 1 tablet by mouth daily.     [name] buPROPion (WELLBUTRIN XL) 150 MG 24 hr tablet Take 150 mg by mouth daily.    . cephALEXin (KEFLEX) 500 MG capsule Take 1  capsule (500 mg total) by mouth 2 (two) times daily. 28 capsule 0  . darbepoetin (ARANESP) 200 MCG/0.4ML SOLN injection Inject 200 mcg into the skin every 14 (fourteen) days.    [name] desvenlafaxine (PRISTIQ) 100 MG 24 hr tablet Take 150 mg by mouth daily. Takes with a 50mg  tablet total of 150mg  daily    . desvenlafaxine (PRISTIQ) 50 MG 24 hr tablet Take 50 mg by mouth daily. Takes with a 100mg  tablet, total daily dose is 150mg     . diphenhydrAMINE-zinc acetate (BENADRYL) cream Apply topically 3 (three) times daily as needed for itching. 28.4 g 0  . Fluticasone-Salmeterol (ADVAIR) 250-50 MCG/DOSE AEPB Inhale 1 puff into the lungs 2 (two) times daily. 2 each 0  . furosemide (LASIX) 40 MG tablet Take 1 tablets (40 mg) in the morning and 1/2 tablet in the afternoon    . guaiFENesin (MUCINEX) 600 MG 12 hr tablet Take 600 mg by mouth 2 (two) times daily. 2 tablet by mouth once daily    . insulin aspart (NOVOLOG) 100 UNIT/ML injection Inject 15-30 Units into the skin daily.    . Insulin Glargine (LANTUS SOLOSTAR) 100 UNIT/ML Solostar Pen Inject 80 Units into the skin at bedtime. (Patient taking differently: Inject 100 Units into the skin at bedtime. ) 30 pen 3  . Insulin Pen Needle (B-D ULTRAFINE III SHORT PEN) 31G X 8 MM MISC Use to inject insulin 2 times daily as instructed. 100 each 2  . lactulose, encephalopathy, (CHRONULAC) 10 GM/15ML SOLN Take 30 mLs (20 g total) by mouth 2 (two) times daily. constipation 2700 mL 6  . levalbuterol (XOPENEX) 0.63 MG/3ML nebulizer solution Take 0.63 mg by nebulization every 8 (eight) hours as needed for shortness of breath.   5  . lisinopril (PRINIVIL,ZESTRIL) 5 MG tablet Take 5 mg by mouth daily.    [name] loratadine (CLARITIN) 10 MG tablet Take 1 tablet (10 mg total) by mouth daily. 30 tablet 1  . Multiple Vitamins-Minerals (CENTRUM SILVER PO) Take 1 tablet by mouth daily.     . mupirocin cream (BACTROBAN) 2 % APPLY 1 APPLICATION TOPICALLY 2 (TWO) TIMES DAILY. FOR SKIN  LESIONS 30 g 0  . nadolol (CORGARD) 20 MG tablet TAKE 1/2 TABLET (10 MG TOTAL) BY MOUTH DAILY. 15 tablet 0  . NON FORMULARY 3 liters oxygen 24/7    . oxyCODONE (OXY IR/ROXICODONE) 5 MG immediate release tablet Take one tablet by mouth every 4 hours as needed for pain 180 tablet 0  . pantoprazole (PROTONIX) 40 MG tablet Take 1 tablet (40 mg total) by mouth 2 (two) times daily. 60 tablet 6  . polyethylene glycol (MIRALAX / GLYCOLAX) packet Take 17 g by mouth daily as needed for moderate constipation.    . promethazine (PHENERGAN) 25 MG tablet Take 1 tablet (25  mg total) by mouth 2 (two) times daily as needed for nausea. 30 tablet 3  . QUEtiapine (SEROQUEL) 400 MG tablet Take 400 mg by mouth at bedtime.    [name] rOPINIRole (REQUIP) 4 MG tablet TAKE 1 TABLET BY MOUTH AT BEDTIME 30 tablet 2  . Saline (OCEAN NASAL SPRAY NA) Place 1 spray into the nose 3 (three) times daily as needed (congestion).     . simvastatin (ZOCOR) 40 MG tablet TAKE 1 TABLET BY MOUTH EVERY EVENING 30 tablet 2  . spironolactone (ALDACTONE) 25 MG tablet Take 0.5 tablets (12.5 mg total) by mouth daily. 15 tablet 0  . Vitamin D, Ergocalciferol, (DRISDOL) 50000 UNITS CAPS capsule TAKE 1 CAPSULE (50,000 UNITS TOTAL) BY MOUTH EVERY 7 (SEVEN) DAYS. TAKE ON FRIDAY 5 capsule 3  . XIFAXAN 550 MG TABS tablet TAKE 1 TABLET (550 MG TOTAL) BY MOUTH DAILY. 30 tablet 2   No current facility-administered medications for this visit.    ALLERGIES:   Acetaminophen; Aspirin; Theophylline; and Penicillins   SOCIAL HISTORY:  The patient  reports that she quit smoking about 22 years ago. Her smoking use included Cigarettes. She has a 37.5 pack-year smoking history. She has never used smokeless tobacco. She reports that she does not drink alcohol or use illicit drugs.   FAMILY HISTORY:  The patient's family history includes Breast cancer in her maternal aunt and sister; Cervical cancer in her mother; Diabetes in her father, mother, and sister; Heart  disease in her mother; Kidney disease in her mother; Multiple sclerosis in her other and sister. There is no history of Colon cancer.    REVIEW OF SYSTEMS:  Positive for weight gain, leg swelling, visual changes, cough, shortness of breath, blood in stool, depression, blood in uringe, back pain, rash, dizziness, easy bruising, fatigue, wheezing, constipation, anxiety, difficulty urinating, and balance problems.  All other systems are reviewed and negative.   PHYSICAL EXAM: VS:  BP 150/62 mmHg  Pulse 52  Ht 5\' 4"  (1.626 m)  Wt 223 lb 12.8 oz (101.515 kg)  BMI 38.40 kg/m2 , BMI Body mass index is 38.4 kg/(m^2). GEN:  Pleasant, obese, chronically ill-appearing woman in no acute distress HEENT: normal Neck: No JVD. carotids 2+ without bruits or masses Cardiac: The heart is RRR with a grade 2/6 systolic murmur at the left sternal border. No edema. Pedal pulses 2+ = bilaterally  Respiratory:  clear to auscultation bilaterally but prolonged expiratory phase noted GI: soft, nontender, nondistended,  obese MS: no deformity or atrophy Skin: warm and dry, no rash Neuro:  Strength and sensation are intact Psych: euthymic mood, full affect  EKG:  EKG is ordered today. The ekg ordered today shows Sinus bradycardia 51 bpm with first degree AV block, nonspecific ST abnormality.  RECENT LABS: 08/11/2014: Pro B Natriuretic peptide (BNP) 226.4*; TSH 1.970 08/12/2014: ALT 26 08/29/2014: B Natriuretic Peptide 233.0*; BUN 31*; Creatinine 1.11*; Platelets 62*; Potassium 3.7; Sodium 137 09/01/2014: Hemoglobin 9.0*  03/22/2014: Cholesterol, Total 108; HDL Cholesterol by NMR 36.00*; LDL (calc) 57; Total CHOL/HDL Ratio 3; Triglycerides 75.0; VLDL 15.0   Estimated Creatinine Clearance: 53.9 mL/min (by C-G formula based on Cr of 1.11).   Wt Readings from Last 3 Encounters:  09/08/14 223 lb 12.8 oz (101.515 kg)  08/24/14 219 lb 8 oz (99.565 kg)  08/01/14 223 lb (101.152 kg)     CARDIAC STUDIES:  2-D  echocardiogram Study Conclusions  - Left ventricle: The cavity size was normal. Wall thickness was increased in a pattern  of mild LVH. Systolic function was normal. The estimated ejection fraction was in the range of 60% to 65%. Wall motion was normal; there were no regional wall motion abnormalities. Left ventricular diastolic function parameters were normal. - Aortic valve: There was mild regurgitation. - Mitral valve: Calcified annulus. - Left atrium: The atrium was moderately dilated. - Right atrium: The atrium was mildly dilated. - Pulmonary arteries: PA peak pressure: 39 mm Hg (S).  ASSESSMENT AND PLAN: 1.   Chronic diastolic heart failure. This is multifactorial and certainly related to her obesity and lung disease. Her aortic stenosis is very mild and I do not think it is playing a role. She also has exacerbations likely related to anemia. I have carefully reviewed her medications. I have also reviewed hospital records from her most recent hospitalizations. She has had acute on chronic kidney injury so we need to be cautious with diuretics and use of an ACE inhibitor. She does seem to be tolerating a low-dose ACE inhibitor. I reviewed a sliding scale for diuretics today based on daily weights. She currently is taking furosemide 40 mg twice daily. If her weight is up 3 pounds in a 24-hour period are greater than 5 pounds from baseline, she will take furosemide 80 mg in the morning and 40 mg in the afternoon. I asked her to do this no more than 2 consecutive doses. Otherwise she will continue on her current medications.  2. Dyspnea : Suspect multifactorial related to COPD, obesity, and chronic diastolic heart failure. She has New York Heart Association functional class III symptoms.  3. Essential hypertension. Systolic blood pressure is elevated today. Will increase furosemide as outlined and hopefully with better volume status her blood pressure will come down.   4. Chronic  anemia with history of gastrointestinal bleeding. No antiplatelet therapy is tolerated.   Labs/ tests ordered today include: none  Disposition:   FU with NP/PA 8 weeks. I think as difficult as her CHF has been, she should be seen every few months for now.  Deatra James 09/08/2014 12:00 PM     [place] [address] [place] [id number]  [phone] (office) [phone] (fax)

## 2014-09-09 ENCOUNTER — Encounter: Payer: Self-pay | Admitting: Pulmonary Disease

## 2014-09-09 ENCOUNTER — Ambulatory Visit (INDEPENDENT_AMBULATORY_CARE_PROVIDER_SITE_OTHER): Payer: Medicare Other | Admitting: Pulmonary Disease

## 2014-09-09 VITALS — BP 118/62 | HR 48 | Temp 97.7°F | Ht 64.0 in | Wt 224.1 lb

## 2014-09-09 DIAGNOSIS — E119 Type 2 diabetes mellitus without complications: Secondary | ICD-10-CM

## 2014-09-09 DIAGNOSIS — F333 Major depressive disorder, recurrent, severe with psychotic symptoms: Secondary | ICD-10-CM

## 2014-09-09 DIAGNOSIS — K31819 Angiodysplasia of stomach and duodenum without bleeding: Secondary | ICD-10-CM

## 2014-09-09 DIAGNOSIS — R5381 Other malaise: Secondary | ICD-10-CM | POA: Diagnosis not present

## 2014-09-09 DIAGNOSIS — I5032 Chronic diastolic (congestive) heart failure: Secondary | ICD-10-CM | POA: Diagnosis not present

## 2014-09-09 DIAGNOSIS — J9611 Chronic respiratory failure with hypoxia: Secondary | ICD-10-CM | POA: Diagnosis not present

## 2014-09-09 DIAGNOSIS — J449 Chronic obstructive pulmonary disease, unspecified: Secondary | ICD-10-CM | POA: Diagnosis not present

## 2014-09-09 DIAGNOSIS — K219 Gastro-esophageal reflux disease without esophagitis: Secondary | ICD-10-CM

## 2014-09-09 DIAGNOSIS — K7469 Other cirrhosis of liver: Secondary | ICD-10-CM

## 2014-09-09 DIAGNOSIS — I359 Nonrheumatic aortic valve disorder, unspecified: Secondary | ICD-10-CM

## 2014-09-09 DIAGNOSIS — D500 Iron deficiency anemia secondary to blood loss (chronic): Secondary | ICD-10-CM

## 2014-09-09 NOTE — Progress Notes (Signed)
Subjective:    Patient ID: [name], female    DOB: Apr 17, 1941, 74 y.o.   MRN: [id number]  HPI 74 y/o WF here for a follow up visit... she has mult med problems as noted below>   ~  September 01, 2012:  73mo ROV & [name] is c/o a URI "cold in my chest" w/ cough, discolored phlegm "diff colors at diff times", chest congestion, etc; she notes- tired all the time, HH is bothering her, BS at home betw 170-200 range... We reviewed the following medical problems during today's office visit>>     COPD> on Home O2, Advair250, NEBS, Mucinex; remote smoker quit yrs ago; known obstructive & restrictive lung dis; she had ZPak recently but persists w/ c/o discolored phlegm, no f/c/s, vague chest discomfort; last CXR 3/13 showed linear scarring left mid lung, otherw clear; CTA 3/13 was neg for PE & showed some atelec, coronary calcif, multilevel spondylosis, & cirrhosis of the liver; she declined f/u film today- we decided to treat w/ Levaquin, Mucinex, Fluids, Hycodan...    HBP> on Aten50, Lasix40, K20; BP= 118/82 & she has mult somatic complaints but no specific CP, palpit, ch in SOB or edema; continue low sodium & wt reduction.     CAD & mild AS/AI> known non-obstructive CAD; hx atypCP/ CWP; no angina & she remains way too sedentary; discussed risk factor reduction strategy & she saw DrCooper 7/13> stable, no angina, too sedentary; f/u 2DEcho w/ mild LVH, norm LVF w/ EF=55-60% & norm wall motion, mils AS & AI, Gr2DD...    Chol> on Simva40; not really on diet & we discussed this; last FLP 5/13 showed TChol 94, TG 130, HDL 32, LDL 36; continue same, needs exercise!    DM> on Lantus25, Metform1000Bid, Glimep2, IDPOEUM353; needs much better diet & exercise program! Not checking BS at home; labs from 10/13 showed BS=164, A1c=5.0 so we decreased the Glimep from $RemoveBef'4mg'TuXilMBxmj$  to $R'2mg'PW$  each AM...    Obesity> way too sedentary, ?on diet & her wt is up to 209# today; we discussed diet, exercise, wt reduction strategies...    <<  Chronic GI problems followed by DrDBrodie >>    {GERD, Angiodysplasia w/ chronic GI blood loss >> see below... 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 We will continue to check labs every 79mo w/ Iron infusions of Hg is reduced & ROV in 36mo...    DJD/ FM/ ?osteopenia/ VitD defic> she has mult somatic complaints & takes Tylenol prn; on VitD 50K weekly...    RLS> on Requip4Qhs & Sinemet25/100Bid from DrDohmeier w/ fair control of symptoms- her note is reviewed...    Anxiety/ Depression/ Psyche> on RX per Psychiatry DrHejazi (we don't have notes from him) & she does not know her meds, didn't bring med bottles or med list!  As best we can tell she is on Nuvigil, Pristiq, Wellbutrin, Risperdal, Seroquel, Xanax> all from Psychiatry & she is reminded to bring all bottles to every visit... We reviewed prob list, meds, xrays and labs> see below for updates >>  LABS 1/14:  CBC- Hg=7.0 (down 4pts) Hct=21.9 Fe=51 (11%sat) B12=623, Protime=11.6/ 1.1, AFP=pending; Abd sonar per DrDBrodie- pending...  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  DrDBrodie asked that we recheck her from the Gerty standpoint due to her SOB>>     COPD> on Home  O2, Advair250, NEBS, Mucinex; remote smoker quit yrs ago; known obstructive & restrictive lung dis; last CXR 2/14 showed cardiomeg, no pulm edema, clear lungs, NAD; CTA 3/13 was neg for PE & showed some atelec, coronary calcif, multilevel spondylosis, & cirrhosis of the liver; she saw TP 3/14 w/ incr cough, thick yellow sput, congestion & post nasal drip> took Avelox & Mucinex w/ some improvement;  We decided to evaluate further>>  PFT today showed FVC=2.41 (83%), FEV1=1.77 (80%), %1sec=74, mid-flows= 61% predicted (all c/w min obstructive dis, +superimposed restriction)...  Ambulatory O2 sats showed 93% sat on RA at rest w/ HR=60; 87% sat after one lap on RA w/ HR=62... Other factors include 12# weight gain, weight =221#, very sedentary lifestyle, anemia (she did not want me to recheck this as it is being followed by DrDBrodie), and severe anxiety...  She thinks a lot of her SOB is from CHF siting her wt gain etc but a recent BNP 3/14 was ok at 180, she has a follow up appt w/ DrCooper soon...  I told her that in my opinion the biggest current issue (and most improvable factor) is anxiety & "chest wall muscle spasm" making her tight & hard to get a good breath "IN" as she described it; I went through my long explanation for this problems & told her that the BEST therapy for this was a "combination relaxer" like KLONOPIN & rec that she try 63m- 1/2 to 1 tab bid... All questions answered & she is agreeable to this plan... ADDENDUM >> pt called back & indicated that after reading the Pharm handout that she decided NOT to take the Klonopin, doesn't want a substitute...  ~  February 25, 2013:  263moOV & post hosp check> since I last saw [name]- she has been Adm 3 times by [facility]>>   Adm 5/25-27 for weakness assoc w/ IDA & GIB> Hx cirrhosis, angiodysplasia, and had EGD showing sm varicies, portal gastropathy, mult AVMs w/ laser ablation; Hg was 7.7=> 2uPCs & improved to 9 range; subseq capsule endoscopy showed  AVMs in upper sm intestines as well... Adm 6/9-12 for N&V assoc w/ constip & UTI (sens EColi- treated w/ Cipro) and underlying cirrhosis, portal gastropathy, angiodysplasia, etc (treated w/ PPI & monitoring of Hg & Fe- given 2uPRBCs & Fe infusions as outpt)...  Adm 02/19/81-42r DiastolicCHF (CXR- vasc congestion; 2DEcho- EF=55-60%, Gr2DD, mildAS/AI; Lasix added)...    COPD> on Home O2, Advair250, NEBS, Mucinex; remote smoker quit yrs ago; known obstructive & restrictive lung dis; CXR 6/14 showed mild cardiomeg, mild vasc congestion, linear atx at left base, otherw clear; CTA 3/13 was neg for PE & showed some atelec, coronary calcif, multilevel spondylosis, & cirrhosis of  the liver; she continues on O2 by [place] at 3L/min...     HBP> on Aten50, Lisinopril5, Lasix20-3/d, K10Qod; BP= 130/70 & she has mult somatic complaints but no specific CP, palpit, ch in SOB or edema; continue low sodium & wt reduction.     CAD, DiastolicCHF, & mild AS/AI> known non-obstructive CAD; hx atypCP/ CWP; no angina & she remains way too sedentary; discussed risk factor reduction strategy & she saw DrCooper 5/14> stable, no angina, too sedentary; last 2DEcho 7/13 w/ mild LVH, norm LVF w/ EF=55-60% & norm wall motion, mild AS&AI, Gr2DD;  EKG 5/14 w/ NSR, rate62, 1st degree AVB, NSSTTWA...    Chol> on Simva40; not really on diet & we discussed this; last FLP 5/13 showed TChol 94, TG 130, HDL 32, LDL 36; continue same, needs exercise!    DM> on Lantus20u & off all DM pills per DrEllison; needs much better diet & exercise program! Not checking BS at home; labs from 6/14 showed BS=169, A1c=6.7 & she will continue f/u w/ Endocrinology...    Obesity> way too sedentary, ?on diet & her wt is 218# today; we discussed diet, exercise, wt reduction strategies...    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    DJD/ FM/ ?osteopenia/ VitD defic> she has mult somatic complaints & takes Tylenol prn; on MVI + VitD 50K weekly...    RLS> on Requip4Qhs & Sinemet25/100Bid from DrDohmeier w/ fair control of symptoms- her note is reviewed...    Anxiety/ Depression/ PsycheHx> on RX per Psychiatry DrHejazi (we don't have notes from him) & she does not know her meds, didn't bring med bottles or med list!  As best we can tell she is on Nuvigil (on hold), Pristiq100, WellbutrinXL150, Seroquel100-4Qhs, Xanax56m prn- all from Psychiatry & she is reminded to bring all bottles to every visit... We reviewed prob list, meds, xrays and labs> see below for updates >>  LABS 7/14:  CBC- Hg=8.8, MCV=93, WBC=3.2, Plat=74K;  Fe=58 (14%sat), Ferritin=12.5..Marland KitchenMarland KitchenNOTE>> Fe is dropping & we decided to infuse FERAHEME [id number] as outpt... ROV 57m9mo~  March 29, 2013:  57mo43mo & Mercades received her IV Feraheme 510mg48msince our last visit; still c/o no energy & SOB; she went to the ER 1wk ago w/ UTI- given Cipro but cult returned EColi resist Cipro & switched to Keflex (sens);  Due for f/u labs today>> We reviewed prob list, meds, xrays and labs> see below for updates >>  CXR 7/14 showed norm heart size, clear lungs, no edema etc... LABS 8/14:  Chems- ok x BS=257 Cr=1.4;  CBC- Hg=8.8 Fe=138 (35%sat)...  Rec>> needs f/u appt w/ DrEllison for endocrine & plan f/u labs monthly...  ~  May 31, 2013:  65mo R53mo since I last saw MarionGeorgeanas been to the ER twice, Hosp once, seen by Cards x3, GI x2, and DrEllison x2> complex story & a lot to get caught up on...    [facility] 05/27/13 by [facility] w/ incr SOB & DOE, found to have Hg=7.7 w/  pos stools, had some nausea but no abd pain; given one unit, given IV Iron; &  she had EGD by DrPyrtle- no varices, smHH, mild gastropathy, area of GAVE treated w/ APC, nodular mucosa (inflammed mucosa, neg HPylori, no dysplasia); Rec rx w/ PPI Bid & Carafate Qid, Aten changed to Corgard for better reduction in portal pressure...  CARDS>  BP stable at 128/68; she had f/u Cards- DrCooper 9/14> nonobstructive CAD, HBP, mildAS, diastolicCHF, HL, morbid obesity; Lasix had been added & adjusted, she is NYHA III, otherw no changes... DM>  Unfortunately her wt is up 14# to 229# today; followed by DrEllison & seen 9/14> DM, neuropathy, CAD; A1c interpret influenced by transfusions; they increased her Lantus to 200u daily...  GI>  Followed by DrDBrodie & DrPyrtle- EGD as above, she had EGD w/ APC for her GAVE... ANEMIA> she received Tx 1u and IV Iron; Hg stabilized ~9...  ~  July 01, 2013:  72moROV but in the interval MLakhiahas had one ER visit (c/o SOB but had "a myriad of complaints"; no acute abn on her w/u w/ CXR, EKG, Labs; disch on same meds)... She saw DrEllison for DM f/u- her sugars ranged 90-400 & A1c=8.0; he changed her insulin dosing to an evening shot... She saw DrDBrodie for GI> hx advanced liver dis from autoimmune hepatitis, ?steatohepatitis, portal hypertension & gastropathy w/ GI blood loss, ?GAVE w/ prev ablation of angodysplasia; they followed up on her lab work & plan to check monthly CBC, Fe... She saw Urology, DrGrapey 10/14 & they noted some mild voiding symptoms and hx recurrent cystitis w/ "a completely pos ROS"; they rec treating UTI only id symptomatic w/ a urinary pathogen...     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 She is also c/o increased cough, congestion & yellow sput x2wks, says she fell & hit right side w/ some right rib  soreness; Recently treated w/ Levaquin but worried that she has pneumonia (CXR today is clear, ?atx in RML, and rib films w/ ?crack in right 7th rib?)...  We reviewed prob list, meds, xrays and labs> extensive med list reviewed w/ pt but she did not bring her med bottles...  ~  October 13, 2013:  366moOV & Jennae has been followed by DrDBrodie & was re-hosp 2/8 - 10/04/13 by [facility] w/ symptomatic anemia, iron deficiency, & her mult chronic medical issues; she was transfused & given IV iron and she has had f/u OV by GI- DrDBrodie who has ordered f/u blood counts and iron levels under her direction; [facility]... [facility] "we talk about my problems, she does blood work, and she communicates w/ DrBrodie"...  Pt has also had f/u visits w/ [facility], and DrEllison for DM/ Endocrine...     COPD> on Home O2, Advair250, NEBS, Mucinex; remote smoker quit yrs ago; known obstructive & restrictive lung dis; CXR 2/15 showed mild cardiomeg, mild vasc congestion, left mid lung scarring & peribronch thickening, otherw clear/NAD; CTA 3/13 was neg for PE & showed some atelec, coronary calcif, multilevel spondylosis, & cirrhosis of the liver; she continues on O2 by [place] at 3L/min...Marland KitchenMarland Kitchen  HBP> on Corgard20-1/2, Lisinopril5, Lasix20-2Bid, K10Qod; BP= 122/68 & she has mult somatic complaints but no specific CP, palpit, ch in SOB or edema; continue low sodium & wt reduction.     CAD, DiastolicCHF, & mild AS/AI> known non-obstructive CAD; hx atypCP/ CWP; no angina & she remains way too sedentary; discussed risk factor reduction strategy & she saw ScottWeaver 12/14> fragile status, chr dyspnea, no angina, too sedentary; last 2DEcho 7/13 w/ mild LVH, norm LVF w/ EF=55-60% & norm wall motion, mild AS&AI, Gr2DD;  EKG 12/14 w/ SBrady, rate47, 1st degree AVB, NSSTTWA; Corgard decr from 20=>51m/d...    Chol> on Simva40; not  really on diet & we discussed this; last FLP 5/13 showed TChol 94, TG 130, HDL 32, LDL 36; continue same, needs exercise, & ret for FLP f/u...    DM> on Lantus&Humalog per DrEllison; needs much better diet & exercise program! Not checking BS at home; labs from [phone] EFort Depositshows BS=130-260 and last A1c was 1/15= 6.5    Obesity> way too sedentary, ?on diet & her wt is 230# today; we discussed diet, exercise, wt reduction strategies...    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    DJD/ FM/ ?osteopenia/ VitD defic> she has mult somatic complaints & takes Tylenol prn; on MVI + VitD 50K weekly...    RLS> on Requip4Qhs & off Sinemet from DrDohmeier w/ fair control of symptoms- prev neuro note is reviewed...    Anxiety/ Depression/ PsycheHx> on RX per Psychiatry DrHejazi (we don't have notes from him) & she does not know her meds, didn't bring med bottles or med list!  As best we can tell she is on Pristiq100, WellbutrinXL150, Seroquel100-4Qhs, Xanax138mprn- all from Psychiatry & she is reminded to bring all bottles to every visit... We reviewed prob list, meds, xrays and labs> see below for updates >> I will continue to eval & treat her COPD, chr resp issues as I transition out of my primary care roll...   ~  Jan 10, 2014:  62m60moV & Jaria is now [facility] for [facility] to [facility] DrCooper for Cards, DrDBrodie for GI> their notes are reviewed...    Here for a 62mo35mo Pulmonary follow up- Combined obstructive & restrictive lung disease> on Home O2, Advair250, NEBS, Mucinex; remote smoker quit yrs  ago; known obstructive & restrictive lung dis by PFTs; CXR 2/15 showed mild cardiomeg, mild vasc congestion, left mid lung scarring & peribronch thickening, otherw clear/NAD; CTA 3/13 was neg for PE & showed some atelec, coronary calcif, multilevel spondylosis, & cirrhosis of the liver; she continues on O2 by [place] at 3L/min...     Breathing has been stable and chest sounds good;  She has not been taking the Mucinex regularly 7 is advised 2Bid w/ fluids;  She desperately needs to lose weight but is unable to exercise she says, noted DOE w/ ADLs but has not been exercising- rec to do chair exercises, the advance to walking, stairs in a progressive fashion...    She continues to be monitored regularly by  PrimaryCare w/ CBCs, periodic transfusions, regular Procrit injections, etc...   ~  May 10, 2014:  43mo ROV & Shereta was Adm 7/31 - 03/31/14 by [facility] w/ UTI, hi fever, encephalopathy, fall w/ sternal fx & 2 right rib fxs, superimposed on her chronic issues of COPD, chr diastolic CHF, cirrhosis, GIB/ AVMs, anemia, etc;  She recovered w/ antibiotics, fluids, supportive care and went for NHP/rehab where she was managed by [place];  Since disch she has seen DrGuttierez for Primary Care & his notes are reviewed...     From the pulmonary standpoint> Cleota remains on O2 at 3L/min by [place] (O2sat=99%), Advair250Bid, Mucinex 600Bid, & ProventilHFA rescue inhaler as needed (ave 1-2 daily);  She reports that her breathing is about at baseline;  Exam shows decr BS bilat w/o wheezing/ rales/ signs of consolidation;  She is rec to continue same meds and concentrate on deep breaths to keep lungs well expanded (Incentive spirometry);  She is doing home PT & slowly improved...     She continues to follow up w/ Cards for her non-obstructive CAD, mild AS, diastolic CHF, obesity (wt is actually down 15# to 219# today);  Med list includes: Corgard20, Lasix40Bid, Aldactone25-1/2 tab/d; BP= 116/68 & she is still very sore...     She is followed for GI by DrDBrodie; cirrhosis likely from steatohepatis, portal HTN, gastropathy & AVMs s/p ablation; meds include: Xifaxan, Lactulose, Aldactone...    DrGuttierez is monitoring her blood counts (Hg was down to 7.5=>9.3), renal function (Cr was up to 3 & is now back to norm=0.9), etc & provides PC transfusions when needed & Aranesp Q2wks...  We reviewed prob list, meds, xrays and labs> see below for updates >>   CXR 7/28 - 03/28/14 showed mid sternal fx, right lat rib fx, right perihilar opac- ?consolidation, mild interstitial edema, cardiomegaly, prob pulm arterial HTN...   CT Chest 03/25/14 showed sternal fx w/ sm hematoma, acute fx of right lat 9th rib, plus additional old rib fxs, patchy atx bilat, cirrhotic liver & enlarged spleen  2DEcho 8/15 showed norm LV size & function w/ EF=55-60%, no regional wall motion abn, mildly thickened AV leaflets, mild AS & Ai, calcif of mitral annulus, mod LA dil...  ~  September 09, 2014:  27mo ROV & Amrutha has seen DrGutierrez several times for Primary Care (DM, UTIs, RI, Anemia on Aranesp, anxiety, etc), GI- LoriH (cirrhosis, portal hypertensive gastropathy, AVMs, chr GI blood loss), DrCooper for Cards (CAD, diastolic CH, mildAS);  She was Hosp again 12/17 - 08/16/14 by [facility] w/ SOB- felt to be multifactorial w/ components of bronchitic infection superimposed on her mild obstructive/ restrictive lung dis (neg CXR & low prob V/Q scan), diastolic CHF (VO=16-07%, PXT=062)), anemia (Hg=7.6 & Tx 2uPCs), and her pain meds & psychotropics all playing a roll (stress- husb in hosp w/ AFib); she was disch on Pred taper, Levaquin, incr dose of Lasix, & off her prev Lisinopril;  Since her disch she has had f/u DrCooper for Cards & he placed her on a sliding scale regimen for Lasix based on weight (currently on extra Lasix for 2d due to wt gain)...     From the pulm standpoint she is an ex-smoker & has mild obstructive/ mod restrictive lung dis (obesity), hx  hypoxemic resp failure on Home O2 at 3L/min continuous, OSA but she is non-compliant w/ CPAP;  Meds: O2 at 3L/min, Advair250Bid, NEBS w/ Xopenex0.63- just using prn & asked to take it Tid, ProairHFA-prn, Mucinex600Bid & asked  to incr to Qid; she is on Keflex now for UTI & just finished Pred taper post hosp... Breathing is stabel, in wheelchair, c/o DOE w/ ADLs x yrs and no real change;  Chest exam is clear w/ distant BS bilat;  Cor w/ Gr1/6 sys murmur no rubs or gallops...    We reviewed prob list, meds, xrays and labs> see below for updates >>   CXR 12/15 showed heart at upper limits, overinflated, no edema or focal airsp dis, NAD...   LABS 12/15 reviewed => Chems- BS up & down (in [place] 549=>190) w/ A1c=6.5, Cr=1.4=>1.1;  CBC- Hg=7.6=>10.6 after 2u...  PLAN>> we decided to continue same meds for her multifactorial dyspnea; she is unable to exercise substantially due to fall risk & I suggested walking pushing her WC; continue regular f/u w/ her other physicians to rx her other factors like diastolicCHF, anemia, narcotics, psychotropics, sedentary lifestyle...            Problem List:  COPD (ICD-496) & BRONCHITIS, RECURRENT (ICD-491.9) - Ex-smoker w/ underlying COPD on home O2, ADVAIR 250Bid, ALBUTEROL (via nebs or MDI for Prn use), & MUCINEX 1-2Bid... she has combined obstructive AND restrictive disease (based on her obesity) w/ diet (weight reduction) + exercise strongly recommended to the pt...  ~  CTChest 5/06 = neg. ~  baseline CXR w/ bilat LL scarring, NAD.... last CXR 3/09 = unchanged, NAD. ~  PFTs 3/09 showed FVC= 2.47 (83%), FEV1= 1.65 (69%), %1sec= 67, mid-flows= 28%pred. ~  CXR 4/12 showed bilat scarring, NAD... ~  PFT 10/12 showed FVC= 2.28 (77%), FEV1= 1.46 (64%), %1sec= 64, mid-flows= 31% pred. ~  CXR 3/13 showed normal heart size, prom hilar regions, no infiltrates etc, NAD.[name]. ~  CTAngio Chest 3/13 showed neg for PE- scarring at lung bases, no adenopathy or lung lesions, calcified  coronaries, multilevel spondylosis in TSpine, cirrhosis in liver ~  CXR 2/14 showed cardiomeg, no pulm edema, clear lungs, NAD ~  PFT 5/14 showed FVC=2.41 (83%), FEV1=1.77 (80%), %1sec=74, mid-flows= 61% predicted (all c/w min obstructive dis, +superimposed restriction) ~  Ambulatory O2 sats 5/14 showed 93% sat on RA at rest w/ HR=60; 87% sat after one lap on RA w/ HR=62 ~  7/14: on Home O2, Advair250, NEBS, Mucinex; remote smoker quit yrs ago; known obstructive & restrictive lung dis; CXR 6/14 showed mild cardiomeg, mild vasc congestion, linear atx at left base, otherw clear; CTA 3/13 was neg for PE & showed some atelec, coronary calcif, multilevel spondylosis, & cirrhosis of the liver; she continues on O2 by [employer] at 3L/min...  ~  CXR 7/14 & 9/14 showed norm heart size, clear lungs, no edema etc..  ~  2/15: on Home O2, Advair250, NEBS, Mucinex; remote smoker quit yrs ago; known obstructive & restrictive lung dis; CXR 2/15 showed mild cardiomeg, mild vasc congestion, left mid lung scarring & peribronch thickening, otherw clear/NAD; CTA 3/13 was neg for PE & showed some atelec, coronary calcif, multilevel spondylosis, & cirrhosis of the liver; she continues on O2 by [place] at 3L/min...  ~  CXR 4/15 & 5/15 by Cards queried a nodular appearance to right hilum but f/u film showed this was wnl; scarring & atx at bases, healing right 7th rib fx... ~  [facility] 8/15 w/ UTI, fall & fx sternum & right ribs> transferred to NHP/ rehab, then home... ~  CXR 7/28 - 03/28/14 showed mid sternal fx, right lat rib fx, right perihilar opac- ?consolidation, mild interstitial edema, cardiomegaly, prob pulm arterial HTN...  ~  CT Chest 03/25/14 showed sternal fx w/ sm hematoma, acute fx of right lat 9th rib, plus additional old rib fxs, patchy atx bilat, cirrhotic liver & enlarged spleen ~  [facility] 12/15 w/ dyspnea- multifactorial & improved w/ antibiotics, Pred, transfused, etc... ~  CXR 12/15 showed heart at upper limits, overinflated, no  edema or focal airsp dis, NAD...   FOLLOWED by DrCooper for CARDIOLOGY >>   HYPERTENSION (ICD-401.9) >> on ATENOLOL 50mg /d,  LASIX 40mg /d & K20/d...   CAD (ICD-414.00) >> on Corgard20-1/2, Lisinopril5, Lasix20-2Bid, K10Qod ~  cath 4/09 by DrCooper showed normLMAIN, 20% midLAD, norm CIRC, 20-30% proxRCA, EF= 50-55%... ~  CTAngio Chest 3/13 showed calcif coronaries as noted... ~  DrCooper 7/13> f/u 2DEcho w/ mild LVH, norm LVF w/ EF=55-60% & norm wall motion, mils AS & AI, Gr2DD.Marland KitchenMarland Kitchen  PALPITATIONS (NWG-956.2) >>  DIASTOLIC CHF >>  ~  eval by DrKlein w/ 7 beats WCT on monitor... he changed Lisinopril to BBlocker (currently ATENOLOL 50mg /d) and improved... ~  she saw DrCopper for Cards f/u 1/11- palpit well controlled on Atenolol; hx non-obstructive CAD & good LVF; no change in Rx. ~  [facility] 1/30 w/ Diastolic CHF and Lasix adjusted- Disch on Lasix20-3/d  AORTIC STENOSIS (ICD-424.1) - she has mild Ao Valve disease w/ AS/ AI... followed by DrCooper. ~  2DEcho 11/06 showed mild ca++ AoV w/ mild AS, norm LVF w/ EF=55-65% ~  repeat 2DEcho 1/09 showed similar mild ca++ AoV w/ mild AS, mild AI, no regional wall motion abn, norm LVf w/ EF= 60%... NOTE: est PAsys= 35... ~  repeat 2DEcho 1/10 showed norm LVF w/ EF= 55-60%, no regional wall motion abn, mild DD, mod calcif AoV & mild reduced leaflet excursion & mild AI... ~  Repeat 2DEcho 7/13 showed norm LVF w/ EF=55-65%, no regional wall motion abn, Gr2DD, mildAS, mildAI, mild LAdil... ~  DrCooper 7/13> stable, no angina, too sedentary; f/u 2DEcho w/ mild LVH, norm LVF w/ EF=55-60% & norm wall motion, mils AS & AI, Gr2DD... ~  7/14: known non-obstructive CAD; hx atypCP/ CWP; no angina & she remains way too sedentary; discussed risk factor reduction strategy & she saw DrCooper 5/14> stable, no angina, too sedentary; last 2DEcho 7/13 w/ mild LVH, norm LVF w/ EF=55-60% & norm wall motion, mild AS&AI, Gr2DD;  EKG 5/14 w/ NSR, rate62, 1st degree AVB, NSSTTWA... ~   2DEcho 8/15 showed norm LV size & function w/ EF=55-60%, no regional wall motion abn, mildly thickened AV leaflets, mild AS & Ai, calcif of mitral annulus, mod LA dil   FOLLOWED by DrGutierez & DrEllison for PRIMARY CARE >>   HYPERCHOLESTEROLEMIA (ICD-272.0) - on SIMVASTATIN 40mg /d...  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  ~  EGD  2/06 showed angiodysplasia & mult gastric pseudopolyps, watermelon stomach improved from 2003. ~  recent f/u EGD 9/09 showed chr gastritis, no watermelon stomach & improved from prev... ~  recurrent anemia 12/10 w/ f/u EGD 2/11 showing gastric antral avm's- s/p argon laser ablation, no varicies etc... 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 ~  5/13:  She is not on PPI and c/o some abd discomfort- rec starting PROTONIX $RemoveBeforeDEI'40mg'XxCWCAWJLKNgHNYu$ /d... ~  She saw DrDBrodie 10/13 w/ c/o dysphagia + f/u of her autoimmune liver dis, portal HTN, cirrhosis, "watermelon stomach" & hx argon laser ablation of gastric AVMs (chr GI blood loss w/ periodic Fe infusions- last 8/13); EGD 10/13 showed norm esoph mucosa, no varicies, spasm at the LES but no stricture found; dilated to 20F; mod severe portal hypertensive gastropathy in stomach, no active bleeding; REC> continue PPI, try Levsin... ~  1/14:  She had EGD & Argon Laser ablation of gastric AVM by DrDBrodie... ~  5/14: EGD showing sm varicies, portal gastropathy, mult AVMs w/ laser ablation; Hg was 7.7=> 2uPCs & improved to 9  range; subseq capsule endoscopy showed AVMs in upper sm intestines as well... ~  9/14:  She was [facility] & seen by GI- drPyrtle w/ EGD (no varices, smHH, mild gastropathy, area of GAVE treated w/ APC, nodular mucosa (inflammed mucosa, neg HPylori, no dysplasia)...  DIVERTICULOSIS OF COLON (ICD-562.10) & POLYP, COLON (ICD-211.3) -  ~  last colonoscopy 10/08 w/ adenomatous polyp removed, and angiodysplasia without hemorrhage ~  CT Abd 10/08 showed Cirrhosis, borderline splenomeg, sl pancreatic atrophy & duct dil, sm umbil hernia, DJD sp, NAD... ~  10/13: she had f/u drDBrodie> last colonoscopy was 2008, she felt pt was too high risk for prep & sedation for a f/u colonoscopy & they decided to hold off...  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 ~  10/11:  presents w/ dysuria & UTI Rx w/ Cipro... ~  12/11:  She had f/u DrGrapey... ~  6/14: she was treated for a sens EColi UTI during 6/14 Hosp w/ Cipro...  OSTEOARTHRITIS (ICD-715.90)  FIBROMYALGIA (ICD-729.1) - she persists w/ mult somatic complaints...  ? of OSTEOPOROSIS (ICD-733.00) - she had normal BMD in 2001 at [place]..  RESTLESS LEGS SYNDROME (ICD-333.94) - she prev weaned off the Requip & remains on SINEMET-CR 25-100Bid... ~  9/10: now c/o recurrent restless leg symptoms... restart REQUIP $RemoveBefore'2mg'ATZItBmqKfLFw$  & titrate up. ~  12/10: persistant symptoms on $RemoveBef'2mg'iyiFiYzzmA$ Qhs... rec to incr to $Remov'4mg'qRaTXd$ ... ~  2/11: she reports resolution of RLS on $Remo'4mg'mTBdI$  Requip Qhs, resting well now. ~  12/11:  Recurrent  RLS symptoms despite the Requip & seen by DrDohmeier w/ addition of Sinemet 25/100 Bid... ~  2012:  Stable on Requip$RemoveBeforeD'4mg'NHYGTwkEwMODAV$  & Sinemet 25/100 Bid... ~  7/13:  She is once again c/o incr RLS symptoms & rec to restart REQUIP $RemoveBefore'4mg'ZQNtcavGxIwmw$ /d...  ~  8/13: she had f/u visit w/ DrDohmeier> RLS, tremors, & insomnia; Seroquel helped for awhile, she is working w/ Immunologist to help her problems... ~  7/14: on Requip4Qhs & Sinemet25/100Bid from DrDohmeier (RLS, Trmeor, Gait abn) w/ fair control of symptoms- her note is reviewed & they stopped the Sinemet...  DYSTHYMIA (ICD-300.4) - Hx depression w/ psychotic features and hosp 2/09 at [facility]  Psychiatric Dept w/ delusions of pedunculosis> she was prev treated by Dr. Viann Fish & DrSuttenfield for counselling...  Adm to [facility] in May09 by DrBarbSmith w/ depression and meds adjusted (see DC summary)...  Now followed by Jobe Gibbon and [name] at [employer] [facility]- she was re-admitted 10/09 and meds changed to RISPERDAL, PRISTIQUE, BUPROPION, SEROQUEL, XANAX & Nuvigil (Armodafinil)...  husb indicates that she is much better at present & continues outpt Rx... ~  MRI/ MRA Brain 2/08 showed sm vessel dis & atherosclerotic changes in post circ... ~  9/10: pt indicates that she is off the Trazodone... DrHejazi added Wellbutrin ?for her RLS? ~  2011:  pt continues regular f/u w/ Psychiatry on numerous meds w/ freq med adjustments; she is requested to get notes from [facility] to our attention & bring all med bottles to her OVs for review... ~  2012:  We have not received any notes from [facility]; pt has not complied w/ our request for med bottles to be brought to each OV... ~  2013:  We still don't have accurate list of her current meds as we have not received [facility] notes & she NEVER brings meds to office despite our requests... ~  7/14: on RX per Psychiatry DrHejazi (we don't have notes from him) & she does not know her meds, didn't bring med bottles or med list!  As best we can  tell she is on Nuvigil (on hold), Pristiq100, WellbutrinXL150, Seroquel100-4Qhs, Xanax1mg  prn- all from Psychiatry & she is reminded to bring all bottles to every visit...   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    Past Surgical History  Procedure Laterality Date  . Hemorroidectomy    . Appendectomy    . Vaginal hysterectomy    . Breast biopsy      bilateral  . Cesarean section      x 3  . Appendectomy    . Esophagogastroduodenoscopy  06/12/2012    Procedure: ESOPHAGOGASTRODUODENOSCOPY (EGD);  Surgeon: Lafayette Dragon, MD;  Location: [facility];  Service: Endoscopy;  Laterality: N/A;  . Balloon dilation  06/12/2012    Procedure: BALLOON DILATION;  Surgeon: Lafayette Dragon, MD;  Location: [facility];  Service: Endoscopy;  Laterality: N/A;  ?balloon  . Esophagogastroduodenoscopy  09/10/2012    Procedure: ESOPHAGOGASTRODUODENOSCOPY (EGD);  Surgeon: Lafayette Dragon, MD;  Location: [facility];  Service: Endoscopy;  Laterality: N/A;  . Hot hemostasis  09/10/2012    Procedure: HOT HEMOSTASIS (ARGON PLASMA COAGULATION/BICAP);  Surgeon: Lafayette Dragon, MD;  Location: [facility];  Service: Endoscopy;  Laterality: N/A;  . Esophagogastroduodenoscopy N/A 01/18/2013    Procedure: ESOPHAGOGASTRODUODENOSCOPY (EGD);  Surgeon: Lafayette Dragon, MD;  Location: [facility];  Service: Endoscopy;  Laterality: N/A;  . Esophagogastroduodenoscopy N/A 05/24/2013    Procedure: ESOPHAGOGASTRODUODENOSCOPY (EGD);  Surgeon: Jerene Bears, MD;  Location: [place];  Service: Gastroenterology;  Laterality: N/A;  . Hot hemostasis N/A 05/24/2013    Procedure: HOT HEMOSTASIS (ARGON PLASMA COAGULATION/BICAP);  Surgeon: Jerene Bears, MD;  Location: [place];  Service: Gastroenterology;  Laterality: N/A;  . US  echocardiography  07/2014    mild LVH, EF 60-65%, normal wall motion, mild AR, mod dilated LA, mildly dilated RA, peak PA pressure 59mmHg    Outpatient Encounter Prescriptions as of 09/09/2014  Medication Sig  . albuterol (PROAIR HFA) 108 (90  BASE) MCG/ACT inhaler Inhale 2 puffs into the lungs every 6 (six) hours as needed for wheezing or shortness of breath.   . ALPRAZolam (XANAX) 1 MG tablet TAKE 1 TABLET EVERY 8 HOURS AS NEEDED FOR ANXIETY  . b complex vitamins tablet Take 1 tablet by mouth daily.   [name] buPROPion (WELLBUTRIN XL) 150 MG 24 hr tablet Take 150 mg by mouth daily.  . cephALEXin (KEFLEX) 500 MG capsule Take 1 capsule (500 mg total) by mouth 2 (two) times daily.  . darbepoetin (ARANESP) 200 MCG/0.4ML SOLN injection Inject 200 mcg into the skin every 14 (fourteen) days.  [name] desvenlafaxine (PRISTIQ) 100 MG 24 hr tablet Take 150 mg by mouth daily. Takes with a $Remo'50mg'twAME$  tablet total of $Remove'150mg'XATnMnw$  daily  . desvenlafaxine (PRISTIQ) 50 MG 24 hr tablet Take 50 mg by mouth daily. Takes with a $Remo'100mg'iPpcZ$  tablet, total daily dose is $Remov'150mg'HyDAGO$   . diphenhydrAMINE-zinc acetate (BENADRYL) cream Apply topically 3 (three) times daily as needed for itching.  . Fluticasone-Salmeterol (ADVAIR) 250-50 MCG/DOSE AEPB Inhale 1 puff into the lungs 2 (two) times daily.  . furosemide (LASIX) 40 MG tablet Take 40 mg by mouth 2 (two) times daily.   [name] guaiFENesin (MUCINEX) 600 MG 12 hr tablet Take 600 mg by mouth 2 (two) times daily. 2 tablet by mouth once daily  . insulin aspart (NOVOLOG) 100 UNIT/ML injection Inject 15-30 Units into the skin daily.  . Insulin Glargine (LANTUS SOLOSTAR) 100 UNIT/ML Solostar Pen Inject 80 Units into the skin at bedtime. (Patient taking differently: Inject 100 Units into the skin at bedtime. )  . Insulin Pen Needle (B-D ULTRAFINE III SHORT PEN) 31G X 8 MM MISC Use to inject insulin 2 times daily as instructed.  . lactulose, encephalopathy, (CHRONULAC) 10 GM/15ML SOLN Take 30 mLs (20 g total) by mouth  2 (two) times daily. constipation  . levalbuterol (XOPENEX) 0.63 MG/3ML nebulizer solution Take 0.63 mg by nebulization every 8 (eight) hours as needed for shortness of breath.   . lisinopril (PRINIVIL,ZESTRIL) 5 MG tablet Take 5 mg by mouth daily.  [name] loratadine (CLARITIN) 10 MG tablet Take 1 tablet (10 mg total) by mouth daily.  . Multiple Vitamins-Minerals (CENTRUM SILVER PO) Take 1 tablet by mouth daily.   . mupirocin cream (BACTROBAN) 2 % APPLY 1 APPLICATION TOPICALLY 2 (TWO) TIMES DAILY. FOR SKIN LESIONS  . nadolol (CORGARD) 20 MG tablet TAKE 1/2 TABLET (10 MG TOTAL) BY MOUTH DAILY.  . NON FORMULARY 3 liters oxygen 24/7  . oxyCODONE (OXY IR/ROXICODONE) 5 MG immediate release tablet Take one tablet by mouth every 4 hours as needed for pain  . pantoprazole (PROTONIX) 40 MG tablet Take 1 tablet (40 mg total) by mouth 2 (two) times daily.  . polyethylene glycol (MIRALAX / GLYCOLAX) packet Take 17 g by mouth daily as needed for moderate constipation.  . promethazine (PHENERGAN) 25 MG tablet Take 1 tablet (25 mg total) by mouth 2 (two) times daily as needed for nausea.  [name] QUEtiapine (SEROQUEL) 400 MG tablet Take 400 mg by mouth at bedtime.  [name] rOPINIRole (REQUIP) 4 MG tablet TAKE 1 TABLET BY MOUTH AT BEDTIME  . Saline (OCEAN NASAL SPRAY NA) Place 1 spray into the nose 3 (three) times daily as needed (congestion).   . simvastatin (ZOCOR) 40 MG tablet TAKE 1 TABLET BY MOUTH EVERY EVENING  . spironolactone (ALDACTONE) 25 MG tablet Take 0.5 tablets (12.5 mg total) by mouth daily.  . Vitamin D, Ergocalciferol, (DRISDOL) 50000 UNITS CAPS capsule  TAKE 1 CAPSULE (50,000 UNITS TOTAL) BY MOUTH EVERY 7 (SEVEN) DAYS. TAKE ON FRIDAY  . XIFAXAN 550 MG TABS tablet TAKE 1 TABLET (550 MG TOTAL) BY MOUTH DAILY.    Allergies  Allergen Reactions  . Acetaminophen Other (See Comments)    Liver dysfunction  . Aspirin Other (See Comments)    Causes nosebleeds  . Theophylline Nausea And Vomiting  . Penicillins  Itching    Current Medications, Allergies, Past Medical History, Past Surgical History, Family History, and Social History were reviewed in [employer] record.    Review of Systems         See HPI - all other systems neg except as noted...      The patient complains of weight gain, dyspnea on exertion, muscle weakness, difficulty walking, and depression.  The patient denies anorexia, fever, weight loss, vision loss, decreased hearing, hoarseness, chest pain, syncope, peripheral edema, prolonged cough, headaches, hemoptysis, abdominal pain, melena, hematochezia, severe indigestion/heartburn, hematuria, incontinence, suspicious skin lesions, transient blindness, unusual weight change, abnormal bleeding, enlarged lymph nodes, and angioedema.     Objective:   Physical Exam     WD, Obese, 74 y/o WF in NAD... she is chr ill appearing... GENERAL:  Alert & oriented x 3... HEENT:  /AT, pale conjuct, EOM-wnl, PERRLA, EACs-clear, TMs-wnl, NOSE-clear, THROAT-clear w/ dry MMs... NECK:  Supple w/ fairROM; no JVD; normal carotid impulses w/o bruits; no thyromegaly or nodules palpated; no lymphadenopathy. CHEST:  Clear to P & A; decr BS bilat without wheezes/ rales/ or rhonchi heard... HEART:  Regular Rhythm;  gr 1/6 SEM without rubs or gallops... ABDOMEN:  Obese, soft & nontender w/ panniculus; normal bowel sounds; no organomegaly or masses detected. EXT: without deformities, mod arthritic changes; no varicose veins/ +venous insuffic/ tr edema. NEURO:  CN's intact;  no focal neuro deficits... DERM:  No lesions noted; no rash, pale complexion.  RADIOLOGY DATA:  Reviewed in the EPIC EMR & discussed w/ the patient...  LABORATORY DATA:  Reviewed in the EPIC EMR & discussed w/ the patient...   Assessment & Plan:    DYSPNEA> clearly this is multifactorial w/ components from Lung dis, CHF, Obesity, inactivity, Anemia, etc; a large component is likely related to anxiety & chest  "tightness" for which she has Xanax $RemoveB'1mg'ERohqnOZ$  from her psychiatrists...  COPD>  Combined mild obstructive & mod restrictive lung dis; PFT showed sl deterioration in FEV1; multifactorial dyspnea including stress; REC> continue Advair, NEBS, O2, Mucinex, etc; get on diet/ exercise/ get weight down; continue Psyche f/u for help w/ anxiety...  HBP>  Controlled on low dose BBlocker plus diuretic> continue same & work on weight reduction...  CAD, DiastolicCHF>  Known nonobstructive dis, way too sedentary> we discussed diet + exercise...  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  CHOL>  Stable on the Simva40...  DM>  Managed by DrEllison on Lantus insulin currently...  OBESITY>  Reviewed diet + exercise needed...  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  Autoimmune hepatitis>  Also managed by DrBrodie, off the Immuran at present... She also has pancytopenia related to her cirrhosis.  RLS>  Notes from DrDohmeier reviewed> pt is off Sinemet now...  Dysthymia>  followed by Psyche on mult meds...  Anemia>  Now managed by [facility] nurse and DrDBrodie for GI, DrGutierrez for Primary..   Patient's Medications  New Prescriptions   No  medications on file  Previous Medications   ALBUTEROL (PROAIR HFA) 108 (90 BASE) MCG/ACT INHALER    Inhale 2 puffs into the lungs every 6 (six) hours as needed for wheezing or shortness of breath.    ALPRAZOLAM (XANAX) 1 MG TABLET    TAKE 1 TABLET EVERY 8 HOURS AS NEEDED FOR ANXIETY   B COMPLEX VITAMINS TABLET    Take 1 tablet by mouth daily.    BUPROPION (WELLBUTRIN XL) 150 MG 24 HR TABLET    Take 150 mg by mouth daily.   CEPHALEXIN (KEFLEX) 500 MG CAPSULE    Take 1 capsule (500 mg total) by mouth 2 (two) times daily.   DARBEPOETIN (ARANESP) 200 MCG/0.4ML SOLN INJECTION    Inject 200 mcg into the skin every 14 (fourteen) days.   DESVENLAFAXINE  (PRISTIQ) 100 MG 24 HR TABLET    Take 150 mg by mouth daily. Takes with a $Remo'50mg'wMcRw$  tablet total of $Remove'150mg'UhXOLnq$  daily   DESVENLAFAXINE (PRISTIQ) 50 MG 24 HR TABLET    Take 50 mg by mouth daily. Takes with a $Remo'100mg'FilXB$  tablet, total daily dose is $Remov'150mg'GdcuRG$    DIPHENHYDRAMINE-ZINC ACETATE (BENADRYL) CREAM    Apply topically 3 (three) times daily as needed for itching.   FLUTICASONE-SALMETEROL (ADVAIR) 250-50 MCG/DOSE AEPB    Inhale 1 puff into the lungs 2 (two) times daily.   FUROSEMIDE (LASIX) 40 MG TABLET    Take 40 mg by mouth 2 (two) times daily.    GUAIFENESIN (MUCINEX) 600 MG 12 HR TABLET    Take 600 mg by mouth 2 (two) times daily. 2 tablet by mouth once daily   INSULIN ASPART (NOVOLOG) 100 UNIT/ML INJECTION    Inject 15-30 Units into the skin daily.   INSULIN GLARGINE (LANTUS SOLOSTAR) 100 UNIT/ML SOLOSTAR PEN    Inject 80 Units into the skin at bedtime.   INSULIN PEN NEEDLE (B-D ULTRAFINE III SHORT PEN) 31G X 8 MM MISC    Use to inject insulin 2 times daily as instructed.   LACTULOSE, ENCEPHALOPATHY, (CHRONULAC) 10 GM/15ML SOLN    Take 30 mLs (20 g total) by mouth 2 (two) times daily. constipation   LEVALBUTEROL (XOPENEX) 0.63 MG/3ML NEBULIZER SOLUTION    Take 0.63 mg by nebulization every 8 (eight) hours as needed for shortness of breath.    LISINOPRIL (PRINIVIL,ZESTRIL) 5 MG TABLET    Take 5 mg by mouth daily.   LORATADINE (CLARITIN) 10 MG TABLET    Take 1 tablet (10 mg total) by mouth daily.   MULTIPLE VITAMINS-MINERALS (CENTRUM SILVER PO)    Take 1 tablet by mouth daily.    MUPIROCIN CREAM (BACTROBAN) 2 %    APPLY 1 APPLICATION TOPICALLY 2 (TWO) TIMES DAILY. FOR SKIN LESIONS   NADOLOL (CORGARD) 20 MG TABLET    TAKE 1/2 TABLET (10 MG TOTAL) BY MOUTH DAILY.   NON FORMULARY    3 liters oxygen 24/7   OXYCODONE (OXY IR/ROXICODONE) 5 MG IMMEDIATE RELEASE TABLET    Take one tablet by mouth every 4 hours as needed for pain   PANTOPRAZOLE (PROTONIX) 40 MG TABLET    Take 1 tablet (40 mg total) by mouth 2 (two) times  daily.   POLYETHYLENE GLYCOL (MIRALAX / GLYCOLAX) PACKET    Take 17 g by mouth daily as needed for moderate constipation.   PROMETHAZINE (PHENERGAN) 25 MG TABLET    Take 1 tablet (25 mg total) by mouth 2 (two) times daily as needed for nausea.   QUETIAPINE (SEROQUEL) 400 MG  TABLET    Take 400 mg by mouth at bedtime.   ROPINIROLE (REQUIP) 4 MG TABLET    TAKE 1 TABLET BY MOUTH AT BEDTIME   SALINE (OCEAN NASAL SPRAY NA)    Place 1 spray into the nose 3 (three) times daily as needed (congestion).    SIMVASTATIN (ZOCOR) 40 MG TABLET    TAKE 1 TABLET BY MOUTH EVERY EVENING   SPIRONOLACTONE (ALDACTONE) 25 MG TABLET    Take 0.5 tablets (12.5 mg total) by mouth daily.   VITAMIN D, ERGOCALCIFEROL, (DRISDOL) 50000 UNITS CAPS CAPSULE    TAKE 1 CAPSULE (50,000 UNITS TOTAL) BY MOUTH EVERY 7 (SEVEN) DAYS. TAKE ON FRIDAY   XIFAXAN 550 MG TABS TABLET    TAKE 1 TABLET (550 MG TOTAL) BY MOUTH DAILY.  Modified Medications   No medications on file  Discontinued Medications   No medications on file

## 2014-09-09 NOTE — Patient Instructions (Signed)
Today we updated your med list in our EPIC system...    Continue your current medications the same...  We reviewed Resp diagnoses and meds   Call for any questions...  Let's plan a follow up visit in 55mo, sooner if needed for problems.[name] KitchenMarland [name]

## 2014-09-09 NOTE — Progress Notes (Deleted)
Subjective:     Patient ID: [name], female   DOB: 1941-06-08, 74 y.o.   MRN: [id number]  HPI   Review of Systems     Objective:   Physical Exam     Assessment:     ***    Plan:     ***

## 2014-09-10 ENCOUNTER — Other Ambulatory Visit: Payer: Self-pay | Admitting: Family Medicine

## 2014-09-13 DIAGNOSIS — R3914 Feeling of incomplete bladder emptying: Secondary | ICD-10-CM | POA: Diagnosis not present

## 2014-09-13 DIAGNOSIS — N3020 Other chronic cystitis without hematuria: Secondary | ICD-10-CM | POA: Diagnosis not present

## 2014-09-14 ENCOUNTER — Telehealth: Payer: Self-pay

## 2014-09-14 MED ORDER — NYSTATIN 100000 UNIT/ML MT SUSP: 5.0000 mL | Freq: Four times a day (QID) | OROMUCOSAL | Status: DC

## 2014-09-14 NOTE — Telephone Encounter (Signed)
Nystatin sent in. See other chart phone note. plz place note in correct chart. Pt doesn't need appt tomorrow.

## 2014-09-14 NOTE — Telephone Encounter (Signed)
Patient notified and appt cancelled. Note in correct chart. Forwarded to [facility] for review.

## 2014-09-14 NOTE — Telephone Encounter (Signed)
Pt has appt on 09/15/14 at 6 pm with Dr Danise Mina.            Magnus Ivan at 09/14/2014 3:16 PM     Status: Signed       Expand All Collapse All   Patient Name: [name]  DOB: 11-Nov-1940    Initial Comment caller states she has a yeast infection due to taking antibiotics   Nurse Assessment  Nurse: Melina Modena, RN, Estill Bamberg Date/Time Eilene Ghazi Time): 09/14/2014 3:07:59 PM  Confirm and document reason for call. If symptomatic, describe symptoms. ---caller states she has a yeast infection due to taking antibiotics  Has the patient traveled out of the country within the last 30 days? ---Not Applicable  Does the patient require triage? ---Yes  Related visit to physician within the last 2 weeks? ---N/A  Does the PT have any chronic conditions? (i.e. diabetes, asthma, etc.) ---Yes  List chronic conditions. ---diabetes, liver disease, COPD     Guidelines    Guideline Title Affirmed Question Affirmed Notes  Mouth Symptoms White patches that stick to tongue or inner cheek    Final Disposition User   See PCP When Office is Open (within 3 days) [place], Therapist, sports, [employer]     No messages in this encounter     Routing History     Priority Sent On From To Message Type     09/14/2014 3:19 PM [place] On Call Pool Patient Calls      Created by     Magnus Ivan on 09/14/2014 03:15 PM     Contacts       Type Contact Phone    09/14/2014 03:15 PM Phone (Incoming) [name], [name] (Self) [phone] (H)

## 2014-09-15 ENCOUNTER — Ambulatory Visit: Payer: Medicare Other | Admitting: Family Medicine

## 2014-09-15 ENCOUNTER — Encounter (HOSPITAL_COMMUNITY)
Admission: RE | Admit: 2014-09-15 | Discharge: 2014-09-15 | Disposition: A | Discharge status: Home / Self Care | Payer: Medicare Other | Source: Ambulatory Visit | Attending: Family Medicine | Admitting: Family Medicine

## 2014-09-15 DIAGNOSIS — D649 Anemia, unspecified: Secondary | ICD-10-CM | POA: Diagnosis not present

## 2014-09-15 DIAGNOSIS — N289 Disorder of kidney and ureter, unspecified: Secondary | ICD-10-CM | POA: Diagnosis not present

## 2014-09-15 DIAGNOSIS — Q2733 Arteriovenous malformation of digestive system vessel: Secondary | ICD-10-CM | POA: Diagnosis not present

## 2014-09-15 DIAGNOSIS — K7460 Unspecified cirrhosis of liver: Secondary | ICD-10-CM | POA: Diagnosis not present

## 2014-09-15 MED ORDER — DARBEPOETIN ALFA 200 MCG/0.4ML IJ SOSY
PREFILLED_SYRINGE | INTRAMUSCULAR | Status: AC
  Filled 2014-09-15: qty 0.4

## 2014-09-15 MED ORDER — DARBEPOETIN ALFA 200 MCG/0.4ML IJ SOSY
200.0000 ug | PREFILLED_SYRINGE | INTRAMUSCULAR | Status: DC
  Administered 2014-09-15: 200 ug via SUBCUTANEOUS

## 2014-09-16 LAB — POCT HEMOGLOBIN-HEMACUE: HEMOGLOBIN: 8.8 g/dL — AB (ref 12.0–15.0)

## 2014-09-18 ENCOUNTER — Other Ambulatory Visit: Payer: Self-pay | Admitting: Cardiovascular Disease

## 2014-09-19 ENCOUNTER — Encounter: Payer: Self-pay | Admitting: *Deleted

## 2014-09-21 NOTE — Telephone Encounter (Signed)
Patient Name: [name] DOB: 04-Aug-1941 Initial Comment caller states she has a yeast infection due to taking antibiotics Nurse Assessment Nurse: Melina Modena, RN, Estill Bamberg Date/Time Eilene Ghazi Time): 09/14/2014 3:07:59 PM Confirm and document reason for call. If symptomatic, describe symptoms. ---caller states she has a yeast infection due to taking antibiotics Has the patient traveled out of the country within the last 30 days? ---Not Applicable Does the patient require triage? ---Yes Related visit to physician within the last 2 weeks? ---N/A Does the PT have any chronic conditions? (i.e. diabetes, asthma, etc.) ---Yes List chronic conditions. ---diabetes, liver disease, COPD Guidelines Guideline Title Affirmed Question Affirmed Notes Mouth Symptoms White patches that stick to tongue or inner cheek Final Disposition User See PCP When Office is Open (within 3 days) [place], Therapist, sports, [employer]

## 2014-09-21 NOTE — Telephone Encounter (Signed)
plz call and schedule appt this week when able.

## 2014-09-21 NOTE — Telephone Encounter (Signed)
PLEASE NOTE: All timestamps contained within this report are represented as [place] Standard Time. CONFIDENTIALTY NOTICE: This fax transmission is intended only for the addressee. It contains information that is legally privileged, confidential or otherwise protected from use or disclosure. If you are not the intended recipient, you are strictly prohibited from reviewing, disclosing, copying using or disseminating any of this information or taking any action in reliance on or regarding this information. If you have received this fax in error, please notify us immediately by telephone so that we can arrange for its return to [place]. Phone: [phone], Toll-Free: [phone], Fax: [phone] Page: 1 of 2 Call Id: [id number] [place] Patient Name: [name] Gender: Female DOB: 28-Sep-1940 Age: 74 Y 2 M 4 D Return Phone Number: [phone] (Primary), [phone] (Secondary) Address: City/State/Zip: [place] Client [place] [facility] - Client Client Site [place] - Day Physician Ria Bush Contact Type Call Call Type Triage / Clinical Relationship To Patient Self Appointment Disposition EMR Appointment Scheduled Return Phone Number [phone] (Primary) Chief Complaint Vaginal Discharge Initial Comment caller states she has a yeast infection due to taking antibiotics PreDisposition Call Doctor Nurse Assessment Nurse: Melina Modena, RN, Estill Bamberg Date/Time Eilene Ghazi Time): 09/14/2014 3:07:59 PM Confirm and document reason for call. If symptomatic, describe symptoms. ---caller states she has a yeast infection due to taking antibiotics Has the patient traveled out of the country within the last 30 days? ---Not Applicable Does the patient require triage? ---Yes Related visit to physician within the last 2 weeks? ---N/A Does the PT have any chronic conditions?  (i.e. diabetes, asthma, etc.) ---Yes List chronic conditions. ---diabetes, liver disease, COPD Guidelines Guideline Title Affirmed Question Affirmed Notes Nurse Date/Time (Eastern Time) Mouth Symptoms White patches that stick to tongue or inner cheek [place], RN, Estill Bamberg 09/14/2014 3:10:02 PM Disp. Time Eilene Ghazi Time) Disposition Final User 09/14/2014 3:13:46 PM See PCP When Office is Open (within 3 days) Yes [place], RN, Shelly Coss Understands: Yes Disagree/Comply: Comply Care Advice Given Per Guideline PLEASE NOTE: All timestamps contained within this report are represented as [place] Standard Time. CONFIDENTIALTY NOTICE: This fax transmission is intended only for the addressee. It contains information that is legally privileged, confidential or otherwise protected from use or disclosure. If you are not the intended recipient, you are strictly prohibited from reviewing, disclosing, copying using or disseminating any of this information or taking any action in reliance on or regarding this information. If you have received this fax in error, please notify us immediately by telephone so that we can arrange for its return to [place]. Phone: [phone], Toll-Free: [phone], Fax: [phone] Page: 2 of 2 Call Id: [id number] Care Advice Given Per Guideline SEE PCP WITHIN 3 DAYS: You need to be examined within 2 or 3 days. Call your doctor during regular office hours and make an appointment. (Note: if office will be open tomorrow, tell caller to call then, not in 3 days). CALL BACK IF: * Difficulty with swallowing occurs (e.g., can't swallow or drooling) * You become worse. CARE ADVICE given per Mouth Pain (Adult) guideline. After Care Instructions Given Call Event Type User Date / Time Description

## 2014-09-22 ENCOUNTER — Ambulatory Visit (INDEPENDENT_AMBULATORY_CARE_PROVIDER_SITE_OTHER): Payer: Medicare Other | Admitting: Family Medicine

## 2014-09-22 ENCOUNTER — Encounter: Payer: Self-pay | Admitting: Family Medicine

## 2014-09-22 VITALS — BP 132/68 | HR 65 | Temp 98.3°F | Wt 226.8 lb

## 2014-09-22 DIAGNOSIS — N3000 Acute cystitis without hematuria: Secondary | ICD-10-CM

## 2014-09-22 DIAGNOSIS — H1131 Conjunctival hemorrhage, right eye: Secondary | ICD-10-CM | POA: Diagnosis not present

## 2014-09-22 DIAGNOSIS — B370 Candidal stomatitis: Secondary | ICD-10-CM | POA: Insufficient documentation

## 2014-09-22 MED ORDER — FLUCONAZOLE 150 MG PO TABS: 150.0000 mg | ORAL_TABLET | Freq: Once | ORAL | Status: DC

## 2014-09-22 NOTE — Assessment & Plan Note (Signed)
Supportive care discussed. Pt will f/u with ophtho next week.

## 2014-09-22 NOTE — Patient Instructions (Signed)
You need to be more regular with mealtimes! For thrush - finish nystatin swish and swallow. If this doesn't take care of thrush, may fill diflucan prescription provided today. Keep appointment with eye doctor next week.

## 2014-09-22 NOTE — Telephone Encounter (Signed)
Appt scheduled

## 2014-09-22 NOTE — Progress Notes (Signed)
Pre visit review using our clinic review tool, if applicable. No additional management support is needed unless otherwise documented below in the visit note. 

## 2014-09-22 NOTE — Addendum Note (Signed)
Addended by: Ria Bush on: 09/22/2014 11:07 PM   Modules accepted: Orders

## 2014-09-22 NOTE — Assessment & Plan Note (Signed)
Saw urology, has started tamsulosin.

## 2014-09-22 NOTE — Progress Notes (Addendum)
BP 132/68 mmHg  Pulse 65  Temp(Src) 98.3 F (36.8 C) (Oral)  Wt 226 lb 12 oz (102.853 kg)  SpO2 96%   CC: check thrush  Subjective:    Patient ID: [name], female    DOB: [birth year], 74 y.o.   MRN: [id number]  HPI: [name] is a 74 y.o. female presenting on 09/22/2014 for Thrush   Recent abx use for UTI - keflex course finished Monday. Concern for developing thrush - white patches off and on for several weeks. We treated with nystatin swish/swallow last week. Initially improved. Now recurring - describes rough spots throughout mouth and cheeks but no pain. Tongue not affected. No ST. No fevers/chills.   Feels congested, noticing some leg swelling. Kayleen Memos NP recently increased lasix to 80mg  bid.   Itchy eye yesterday - rubbed it and red eye ever since. No pain, no further itching, no pressure sensation.  Relevant past medical, surgical, family and social history reviewed and updated as indicated. Interim medical history since our last visit reviewed. Allergies and medications reviewed and updated. Current Outpatient Prescriptions on File Prior to Visit  Medication Sig  . albuterol (PROAIR HFA) 108 (90 BASE) MCG/ACT inhaler Inhale 2 puffs into the lungs every 6 (six) hours as needed for wheezing or shortness of breath.   . ALPRAZolam (XANAX) 1 MG tablet TAKE 1 TABLET EVERY 8 HOURS AS NEEDED FOR ANXIETY  . b complex vitamins tablet Take 1 tablet by mouth daily.   [name] buPROPion (WELLBUTRIN XL) 150 MG 24 hr tablet Take 150 mg by mouth daily.  . darbepoetin (ARANESP) 200 MCG/0.4ML SOLN injection Inject 200 mcg into the skin every 14 (fourteen) days.  [name] desvenlafaxine (PRISTIQ) 100 MG 24 hr tablet Take 150 mg by mouth daily. Takes with a 50mg  tablet total of 150mg  daily  . desvenlafaxine (PRISTIQ) 50 MG 24 hr tablet Take 50 mg by mouth daily. Takes with a 100mg  tablet, total daily dose is 150mg   . diphenhydrAMINE-zinc acetate (BENADRYL) cream Apply topically 3 (three) times  daily as needed for itching.  . Fluticasone-Salmeterol (ADVAIR) 250-50 MCG/DOSE AEPB Inhale 1 puff into the lungs 2 (two) times daily.  . furosemide (LASIX) 40 MG tablet Take 80 mg by mouth 2 (two) times daily.   [name] guaiFENesin (MUCINEX) 600 MG 12 hr tablet Take 600 mg by mouth 2 (two) times daily. 2 tablet by mouth once daily  . Insulin Glargine (LANTUS SOLOSTAR) 100 UNIT/ML Solostar Pen Inject 80 Units into the skin at bedtime. (Patient taking differently: Inject 100 Units into the skin at bedtime. )  . insulin lispro (HUMALOG) 100 UNIT/ML KiwkPen INJECT 15-30 UNITS INTO THE SKIN AT SUPPER PER SLIDING SCALE  . Insulin Pen Needle (B-D ULTRAFINE III SHORT PEN) 31G X 8 MM MISC Use to inject insulin 2 times daily as instructed.  . lactulose, encephalopathy, (CHRONULAC) 10 GM/15ML SOLN Take 30 mLs (20 g total) by mouth 2 (two) times daily. constipation  . levalbuterol (XOPENEX) 0.63 MG/3ML nebulizer solution Take 0.63 mg by nebulization every 8 (eight) hours as needed for shortness of breath.   . lisinopril (PRINIVIL,ZESTRIL) 5 MG tablet Take 5 mg by mouth daily.  [name] loratadine (CLARITIN) 10 MG tablet Take 1 tablet (10 mg total) by mouth daily.  . Multiple Vitamins-Minerals (CENTRUM SILVER PO) Take 1 tablet by mouth daily.   . mupirocin cream (BACTROBAN) 2 % APPLY 1 APPLICATION TOPICALLY 2 (TWO) TIMES DAILY. FOR SKIN LESIONS  . nadolol (CORGARD) 20 MG tablet  TAKE 1/2 TABLET (10 MG TOTAL) BY MOUTH DAILY.  . NON FORMULARY 3 liters oxygen 24/7  . nystatin (MYCOSTATIN) 100000 UNIT/ML suspension Take 5 mLs (500,000 Units total) by mouth 4 (four) times daily.  [name] oxyCODONE (OXY IR/ROXICODONE) 5 MG immediate release tablet Take one tablet by mouth every 4 hours as needed for pain  . pantoprazole (PROTONIX) 40 MG tablet Take 1 tablet (40 mg total) by mouth 2 (two) times daily.  . polyethylene glycol (MIRALAX / GLYCOLAX) packet Take 17 g by mouth daily as needed for moderate constipation.  . promethazine  (PHENERGAN) 25 MG tablet Take 1 tablet (25 mg total) by mouth 2 (two) times daily as needed for nausea.  [name] QUEtiapine (SEROQUEL) 400 MG tablet Take 400 mg by mouth at bedtime.  [name] rOPINIRole (REQUIP) 4 MG tablet TAKE 1 TABLET BY MOUTH AT BEDTIME  . Saline (OCEAN NASAL SPRAY NA) Place 1 spray into the nose 3 (three) times daily as needed (congestion).   . simvastatin (ZOCOR) 40 MG tablet TAKE 1 TABLET BY MOUTH EVERY EVENING  . spironolactone (ALDACTONE) 25 MG tablet TAKE 0.5 TABLETS (12.5 MG TOTAL) BY MOUTH DAILY.  [name] Vitamin D, Ergocalciferol, (DRISDOL) 50000 UNITS CAPS capsule TAKE 1 CAPSULE (50,000 UNITS TOTAL) BY MOUTH EVERY 7 (SEVEN) DAYS. TAKE ON FRIDAY  . XIFAXAN 550 MG TABS tablet TAKE 1 TABLET (550 MG TOTAL) BY MOUTH DAILY.   No current facility-administered medications on file prior to visit.    Review of Systems Per HPI unless specifically indicated above     Objective:    BP 132/68 mmHg  Pulse 65  Temp(Src) 98.3 F (36.8 C) (Oral)  Wt 226 lb 12 oz (102.853 kg)  SpO2 96%  Wt Readings from Last 3 Encounters:  09/22/14 226 lb 12 oz (102.853 kg)  09/09/14 224 lb 2 oz (101.662 kg)  09/08/14 223 lb 12.8 oz (101.515 kg)    Physical Exam  Constitutional: She appears well-developed and well-nourished. No distress.  Morbidly obese in wheelchair  HENT:  Mouth/Throat: Uvula is midline, oropharynx is clear and moist and mucous membranes are normal. No oropharyngeal exudate, posterior oropharyngeal edema, posterior oropharyngeal erythema or tonsillar abscesses.  White spots posterior soft palate peri-uvula No patch on tongue or inner cheeks  Eyes: EOM are normal. Pupils are equal, round, and reactive to light. Right conjunctiva has a hemorrhage (large subconjunctival hemorrhage with some inferior pooling).    Neck: Normal range of motion. Neck supple.  Cardiovascular: Normal rate, regular rhythm, normal heart sounds and intact distal pulses.   No murmur heard. Pulmonary/Chest:  Effort normal and breath sounds normal. No respiratory distress. She has no wheezes. She has no rales.  Musculoskeletal: She exhibits no edema.  Lymphadenopathy:    She has no cervical adenopathy.  Nursing note and vitals reviewed.  Results for orders placed or performed during the hospital encounter of 09/15/14  Hemoglobin-hemacue, POC  Result Value Ref Range   Hemoglobin 8.8 (L) 12.0 - 15.0 g/dL      Assessment & Plan:   Problem List Items Addressed This Visit    Subconjunctival hemorrhage of right eye    Supportive care discussed. Pt will f/u with ophtho next week.      Oral thrush - Primary    Thrush seems to be improving on posterior soft palate - recommended continue nystatin swish/swallow. If not improved with this, will send in diflucan for her to fill - sent to pharmacy.      Relevant Medications  fluconazole (DIFLUCAN) tablet 150 mg   Acute cystitis without hematuria    Saw urology, has started tamsulosin.          Follow up plan: Return if symptoms worsen or fail to improve.

## 2014-09-22 NOTE — Assessment & Plan Note (Addendum)
[name] seems to be improving on posterior soft palate - recommended continue nystatin swish/swallow. If not improved with this, will send in diflucan for her to fill - sent to pharmacy.

## 2014-09-23 DIAGNOSIS — J449 Chronic obstructive pulmonary disease, unspecified: Secondary | ICD-10-CM | POA: Diagnosis not present

## 2014-09-23 DIAGNOSIS — S42019A Nondisplaced fracture of sternal end of unspecified clavicle, initial encounter for closed fracture: Secondary | ICD-10-CM | POA: Diagnosis not present

## 2014-09-27 DIAGNOSIS — H2513 Age-related nuclear cataract, bilateral: Secondary | ICD-10-CM | POA: Diagnosis not present

## 2014-09-27 DIAGNOSIS — E119 Type 2 diabetes mellitus without complications: Secondary | ICD-10-CM | POA: Diagnosis not present

## 2014-09-27 DIAGNOSIS — H04123 Dry eye syndrome of bilateral lacrimal glands: Secondary | ICD-10-CM | POA: Diagnosis not present

## 2014-09-27 DIAGNOSIS — H1131 Conjunctival hemorrhage, right eye: Secondary | ICD-10-CM | POA: Diagnosis not present

## 2014-09-27 LAB — HM DIABETES EYE EXAM

## 2014-09-28 ENCOUNTER — Telehealth: Payer: Self-pay | Admitting: Pulmonary Disease

## 2014-09-28 MED ORDER — FLUTICASONE-SALMETEROL 250-50 MCG/DOSE IN AEPB: 1.0000 | INHALATION_SPRAY | Freq: Two times a day (BID) | RESPIRATORY_TRACT | Status: DC

## 2014-09-28 NOTE — Telephone Encounter (Signed)
Pulmonary message in error.

## 2014-09-28 NOTE — Telephone Encounter (Signed)
Spoke with pt and advised that sample of Advair was left at front desk.

## 2014-09-29 ENCOUNTER — Other Ambulatory Visit: Payer: Self-pay | Admitting: Pulmonary Disease

## 2014-09-30 ENCOUNTER — Other Ambulatory Visit: Payer: Self-pay | Admitting: Pulmonary Disease

## 2014-09-30 ENCOUNTER — Other Ambulatory Visit: Payer: Self-pay | Admitting: *Deleted

## 2014-09-30 ENCOUNTER — Encounter (HOSPITAL_COMMUNITY)
Admission: RE | Admit: 2014-09-30 | Discharge: 2014-09-30 | Disposition: A | Discharge status: Home / Self Care | Payer: Medicare Other | Source: Ambulatory Visit | Attending: Pulmonary Disease | Admitting: Pulmonary Disease

## 2014-09-30 DIAGNOSIS — K7460 Unspecified cirrhosis of liver: Secondary | ICD-10-CM | POA: Diagnosis not present

## 2014-09-30 DIAGNOSIS — N289 Disorder of kidney and ureter, unspecified: Secondary | ICD-10-CM | POA: Insufficient documentation

## 2014-09-30 DIAGNOSIS — D649 Anemia, unspecified: Secondary | ICD-10-CM | POA: Diagnosis not present

## 2014-09-30 DIAGNOSIS — Q2733 Arteriovenous malformation of digestive system vessel: Secondary | ICD-10-CM | POA: Insufficient documentation

## 2014-09-30 LAB — POCT HEMOGLOBIN-HEMACUE: HEMOGLOBIN: 9 g/dL — AB (ref 12.0–15.0)

## 2014-09-30 MED ORDER — DARBEPOETIN ALFA 200 MCG/0.4ML IJ SOSY
200.0000 ug | PREFILLED_SYRINGE | INTRAMUSCULAR | Status: DC
  Administered 2014-09-30: 200 ug via SUBCUTANEOUS

## 2014-09-30 MED ORDER — INSULIN PEN NEEDLE 31G X 8 MM MISC: Status: DC

## 2014-09-30 MED ORDER — DARBEPOETIN ALFA 200 MCG/0.4ML IJ SOSY
PREFILLED_SYRINGE | INTRAMUSCULAR | Status: AC
  Filled 2014-09-30: qty 0.4

## 2014-09-30 MED ORDER — ONETOUCH LANCETS MISC: Status: DC

## 2014-09-30 MED ORDER — GLUCOSE BLOOD VI STRP: 1.0000 | ORAL_STRIP | Status: DC

## 2014-10-01 ENCOUNTER — Other Ambulatory Visit: Payer: Self-pay | Admitting: Cardiovascular Disease

## 2014-10-03 ENCOUNTER — Encounter: Payer: Self-pay | Admitting: *Deleted

## 2014-10-04 ENCOUNTER — Other Ambulatory Visit: Payer: Self-pay | Admitting: Family Medicine

## 2014-10-04 DIAGNOSIS — J449 Chronic obstructive pulmonary disease, unspecified: Secondary | ICD-10-CM | POA: Diagnosis not present

## 2014-10-04 NOTE — Telephone Encounter (Signed)
Ok to refill 

## 2014-10-04 NOTE — Telephone Encounter (Signed)
plz phone in. 

## 2014-10-04 NOTE — Telephone Encounter (Signed)
Rx called in as directed.   

## 2014-10-08 DIAGNOSIS — J449 Chronic obstructive pulmonary disease, unspecified: Secondary | ICD-10-CM | POA: Diagnosis not present

## 2014-10-11 ENCOUNTER — Ambulatory Visit (INDEPENDENT_AMBULATORY_CARE_PROVIDER_SITE_OTHER): Payer: Medicare Other | Admitting: Internal Medicine

## 2014-10-11 ENCOUNTER — Encounter: Payer: Self-pay | Admitting: Internal Medicine

## 2014-10-11 ENCOUNTER — Ambulatory Visit: Payer: Medicare Other | Admitting: Internal Medicine

## 2014-10-11 VITALS — BP 118/70 | HR 68 | Ht 64.0 in | Wt 231.0 lb

## 2014-10-11 DIAGNOSIS — R195 Other fecal abnormalities: Secondary | ICD-10-CM

## 2014-10-11 DIAGNOSIS — D500 Iron deficiency anemia secondary to blood loss (chronic): Secondary | ICD-10-CM

## 2014-10-11 DIAGNOSIS — K31811 Angiodysplasia of stomach and duodenum with bleeding: Secondary | ICD-10-CM | POA: Diagnosis not present

## 2014-10-11 DIAGNOSIS — K31819 Angiodysplasia of stomach and duodenum without bleeding: Secondary | ICD-10-CM | POA: Diagnosis not present

## 2014-10-11 DIAGNOSIS — K922 Gastrointestinal hemorrhage, unspecified: Secondary | ICD-10-CM | POA: Diagnosis not present

## 2014-10-11 NOTE — Patient Instructions (Signed)
You have been scheduled for an endoscopy at [facility]. Please follow written instructions given to you at your visit today. If you use inhalers (even only as needed), please bring them with you on the day of your procedure.  I appreciate the opportunity to care for you.

## 2014-10-11 NOTE — Progress Notes (Signed)
[name] 1940-11-13 [id number]  Note: This dictation was prepared with Dragon digital system. Any transcriptional errors that result from this procedure are unintentional.   History of Present Illness: This is a 74 year old white female with the non-alcohol related hepatitis. Cirrhosis and portal hypertension. 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 He has AVMs on small bowel capsule endoscopy in June 2011. Last ablation of the AVMs was in September 2014 when she presented with severe anemia. She has required periodic iron infusions as well as Arenesp injections every 2 weeks. Last hemoglobin last week 9.0. She has intermittently   black stools.    Past Medical History  Diagnosis Date  . Chronic airway obstruction, not elsewhere classified   . Unspecified chronic bronchitis   . Unspecified essential hypertension   . Non-obstructive CAD   . Palpitations   . Mild aortic stenosis     a. 03/2014 Valve area (VTI): 2.89 cm^2, Valve area (Vmax): 2.7 cm^2.  . Pure hypercholesterolemia   . Morbid obesity   . Esophageal reflux   . 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 Diverticulosis of colon (without mention of hemorrhage)   . Benign neoplasm of colon   . Autoimmune hepatitis   . Cystitis, unspecified   . Osteoarthrosis, unspecified whether generalized or localized, unspecified site   . Osteoporosis, unspecified   . Restless legs syndrome (RLS)   . Major depressive disorder, recurrent episode, severe, specified as with psychotic behavior   . Iron deficiency anemia secondary to blood loss (chronic)     a. frequent PRBC transfusions.  . Coarse tremors     a. arms.  . Carpal tunnel syndrome on both sides   . Palpitations   . Chronic diastolic CHF (congestive heart failure)     a. 03/26/2014 Echo: EF 55-60%, no rwma, mild AS/AI, mod-sev Ca2+ MV annulus, mildly to mod dil LA.  [name] Asthma   . OSA (obstructive sleep apnea)      a. does not use CPAP.  [name] Type II diabetes mellitus   . H/O hiatal hernia   . Cirrhosis     a. dx'd 1990's  . Adenomatous colon polyp   . Midsternal chest pain     a. conservatively managed ->poor candidate for cath/anticoagulation.  [name] Anxiety   . History of pneumonia   . AVM (arteriovenous malformation)   . Portal hypertensive gastropathy   . Falls frequently     completed HHPT/OT 06/2014, PT unmet goals  . UTI (lower urinary tract infection)   . Hiatal hernia   . GAVE (gastric antral vascular ectasia)   . Nonalcoholic steatohepatitis (NASH)   . Thrombocytopenia   . Protein calorie malnutrition     Past Surgical History  Procedure Laterality Date  . Hemorroidectomy    . Appendectomy    . Vaginal hysterectomy    . Breast biopsy      bilateral  . Cesarean section      x 3  . Appendectomy    . Esophagogastroduodenoscopy  06/12/2012    Procedure: ESOPHAGOGASTRODUODENOSCOPY (EGD);  Surgeon: Lafayette Dragon, MD;  Location: [facility];  Service: Endoscopy;  Laterality: N/A;  . Balloon dilation  06/12/2012    Procedure: BALLOON DILATION;  Surgeon: Lafayette Dragon, MD;  Location: [facility];  Service: Endoscopy;  Laterality: N/A;  ?balloon  . Esophagogastroduodenoscopy  09/10/2012    Procedure: ESOPHAGOGASTRODUODENOSCOPY (EGD);  Surgeon: Lafayette Dragon, MD;  Location: [facility];  Service: Endoscopy;  Laterality: N/A;  . Hot hemostasis  09/10/2012    Procedure: HOT HEMOSTASIS (ARGON PLASMA COAGULATION/BICAP);  Surgeon: Lafayette Dragon, MD;  Location: [facility];  Service: Endoscopy;  Laterality: N/A;  . Esophagogastroduodenoscopy N/A 01/18/2013    Procedure: ESOPHAGOGASTRODUODENOSCOPY (EGD);  Surgeon: Lafayette Dragon, MD;  Location: [facility];  Service: Endoscopy;  Laterality: N/A;  . Esophagogastroduodenoscopy N/A 05/24/2013    Procedure: ESOPHAGOGASTRODUODENOSCOPY (EGD);  Surgeon: Jerene Bears, MD;  Location: [place];  Service: Gastroenterology;  Laterality: N/A;  . Hot  hemostasis N/A 05/24/2013    Procedure: HOT HEMOSTASIS (ARGON PLASMA COAGULATION/BICAP);  Surgeon: Jerene Bears, MD;  Location: [place];  Service: Gastroenterology;  Laterality: N/A;  . US echocardiography  07/2014    mild LVH, EF 60-65%, normal wall motion, mild AR, mod dilated LA, mildly dilated RA, peak PA pressure 93mmHg    Allergies  Allergen Reactions  . Acetaminophen Other (See Comments)    Liver dysfunction  . Aspirin Other (See Comments)    Causes nosebleeds  . Theophylline Nausea And Vomiting  . Penicillins Itching    Family history and social history have been reviewed.  Review of Systems:   The remainder of the 10 point ROS is negative except as outlined in the H&P  Physical Exam: General Appearance Well developed, in no distress obese Eyes  Non icteric  HEENT  Non traumatic, normocephalic  Mouth No lesion, tongue papillated, no cheilosis edentulous Neck Supple without adenopathy, thyroid not enlarged, no carotid bruits, no JVD Lungs Clear to auscultation bilaterally COR Normal S1, normal S2, regular rhythm, no murmur, quiet precordium Abdomen obese soft, diffusely tender. Hepatomegaly. No ascites. Multiple ecchymosis and scratch marks. Enlarged spleen.  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 Last hemoglobin is down to 9 g. She is Hemoccult positive and reporting black stools. Last APC ablation of the AVMs was in September 2014. We will schedule her for upper endoscopy with ablation of AVMs. 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 Portal hypertension coagulopathy and intermittent and Zoloft. 3. She is to continue this  rifaximin and lactulose  History of  congestive heart  failure    [name] 10/11/2014

## 2014-10-12 ENCOUNTER — Encounter (HOSPITAL_COMMUNITY): Payer: Self-pay | Admitting: *Deleted

## 2014-10-13 ENCOUNTER — Other Ambulatory Visit: Payer: Self-pay | Admitting: Family Medicine

## 2014-10-14 ENCOUNTER — Encounter (HOSPITAL_COMMUNITY)
Admission: RE | Admit: 2014-10-14 | Discharge: 2014-10-14 | Disposition: A | Discharge status: Home / Self Care | Payer: Medicare Other | Source: Ambulatory Visit | Attending: Family Medicine | Admitting: Family Medicine

## 2014-10-14 DIAGNOSIS — Q2733 Arteriovenous malformation of digestive system vessel: Secondary | ICD-10-CM | POA: Diagnosis not present

## 2014-10-14 DIAGNOSIS — N289 Disorder of kidney and ureter, unspecified: Secondary | ICD-10-CM | POA: Diagnosis not present

## 2014-10-14 DIAGNOSIS — D649 Anemia, unspecified: Secondary | ICD-10-CM | POA: Diagnosis not present

## 2014-10-14 DIAGNOSIS — K7460 Unspecified cirrhosis of liver: Secondary | ICD-10-CM | POA: Diagnosis not present

## 2014-10-14 LAB — POCT HEMOGLOBIN-HEMACUE: Hemoglobin: 8.6 g/dL — ABNORMAL LOW (ref 12.0–15.0)

## 2014-10-14 MED ORDER — DARBEPOETIN ALFA 200 MCG/0.4ML IJ SOSY
200.0000 ug | PREFILLED_SYRINGE | INTRAMUSCULAR | Status: DC
  Administered 2014-10-14: 200 ug via SUBCUTANEOUS

## 2014-10-15 MED ORDER — DARBEPOETIN ALFA 200 MCG/0.4ML IJ SOSY
PREFILLED_SYRINGE | INTRAMUSCULAR | Status: AC
  Filled 2014-10-15: qty 0.4

## 2014-10-17 ENCOUNTER — Encounter: Payer: Self-pay | Admitting: *Deleted

## 2014-10-18 ENCOUNTER — Telehealth: Payer: Self-pay | Admitting: *Deleted

## 2014-10-18 NOTE — Telephone Encounter (Signed)
Received a call from [place] requesting to move patient's procedure to 8 or 9 AM instead of 11:00 AM. Spoke with Dr. Olevia Perches and patient. Procedure to be done at 9:00 AM. Sharee Pimple aware also.

## 2014-10-19 ENCOUNTER — Telehealth: Payer: Self-pay

## 2014-10-19 NOTE — Telephone Encounter (Signed)
Pt left v/m requesting cb about prior auth for xifaxan; pt request call to ins co. [phone] to do PA.

## 2014-10-19 NOTE — H&P (View-Only) (Signed)
[name] 12/06/1940 [id number]  Note: This dictation was prepared with [name] digital system. Any transcriptional errors that result from this procedure are unintentional.   History of Present Illness: This is a 74 year old white female with the non-alcohol related hepatitis. Cirrhosis and portal hypertension. 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 He has AVMs on small bowel capsule endoscopy in June 2011. Last ablation of the AVMs was in September 2014 when she presented with severe anemia. She has required periodic iron infusions as well as Arenesp injections every 2 weeks. Last hemoglobin last week 9.0. She has intermittently   black stools.    Past Medical History  Diagnosis Date  . Chronic airway obstruction, not elsewhere classified   . Unspecified chronic bronchitis   . Unspecified essential hypertension   . Non-obstructive CAD   . Palpitations   . Mild aortic stenosis     a. 03/2014 Valve area (VTI): 2.89 cm^2, Valve area (Vmax): 2.7 cm^2.  . Pure hypercholesterolemia   . Morbid obesity   . Esophageal reflux   . 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 Diverticulosis of colon (without mention of hemorrhage)   . Benign neoplasm of colon   . Autoimmune hepatitis   . Cystitis, unspecified   . Osteoarthrosis, unspecified whether generalized or localized, unspecified site   . Osteoporosis, unspecified   . Restless legs syndrome (RLS)   . Major depressive disorder, recurrent episode, severe, specified as with psychotic behavior   . Iron deficiency anemia secondary to blood loss (chronic)     a. frequent PRBC transfusions.  . Coarse tremors     a. arms.  . Carpal tunnel syndrome on both sides   . Palpitations   . Chronic diastolic CHF (congestive heart failure)     a. 03/26/2014 Echo: EF 55-60%, no rwma, mild AS/AI, mod-sev Ca2+ MV annulus, mildly to mod dil LA.  [name] Asthma   . OSA (obstructive sleep apnea)      a. does not use CPAP.  [name] Type II diabetes mellitus   . H/O hiatal hernia   . Cirrhosis     a. dx'd 1990's  . Adenomatous colon polyp   . Midsternal chest pain     a. conservatively managed ->poor candidate for cath/anticoagulation.  [name] Anxiety   . History of pneumonia   . AVM (arteriovenous malformation)   . Portal hypertensive gastropathy   . Falls frequently     completed HHPT/OT 06/2014, PT unmet goals  . UTI (lower urinary tract infection)   . Hiatal hernia   . GAVE (gastric antral vascular ectasia)   . Nonalcoholic steatohepatitis (NASH)   . Thrombocytopenia   . Protein calorie malnutrition     Past Surgical History  Procedure Laterality Date  . Hemorroidectomy    . Appendectomy    . Vaginal hysterectomy    . Breast biopsy      bilateral  . Cesarean section      x 3  . Appendectomy    . Esophagogastroduodenoscopy  06/12/2012    Procedure: ESOPHAGOGASTRODUODENOSCOPY (EGD);  Surgeon: [name] Dragon, MD;  Location: [facility];  Service: Endoscopy;  Laterality: N/A;  . Balloon dilation  06/12/2012    Procedure: BALLOON DILATION;  Surgeon: [name] Dragon, MD;  Location: [facility];  Service: Endoscopy;  Laterality: N/A;  ?balloon  . Esophagogastroduodenoscopy  09/10/2012    Procedure: ESOPHAGOGASTRODUODENOSCOPY (EGD);  Surgeon: [name] Dragon, MD;  Location: [facility];  Service: Endoscopy;  Laterality: N/A;  . Hot hemostasis  09/10/2012    Procedure: HOT HEMOSTASIS (ARGON PLASMA COAGULATION/BICAP);  Surgeon: [name] Dragon, MD;  Location: [facility];  Service: Endoscopy;  Laterality: N/A;  . Esophagogastroduodenoscopy N/A 01/18/2013    Procedure: ESOPHAGOGASTRODUODENOSCOPY (EGD);  Surgeon: [name] Dragon, MD;  Location: [facility];  Service: Endoscopy;  Laterality: N/A;  . Esophagogastroduodenoscopy N/A 05/24/2013    Procedure: ESOPHAGOGASTRODUODENOSCOPY (EGD);  Surgeon: [name] Bears, MD;  Location: [place];  Service: Gastroenterology;  Laterality: N/A;  . Hot  hemostasis N/A 05/24/2013    Procedure: HOT HEMOSTASIS (ARGON PLASMA COAGULATION/BICAP);  Surgeon: [name] Bears, MD;  Location: [place];  Service: Gastroenterology;  Laterality: N/A;  . US echocardiography  07/2014    mild LVH, EF 60-65%, normal wall motion, mild AR, mod dilated LA, mildly dilated RA, peak PA pressure 38mmHg    Allergies  Allergen Reactions  . Acetaminophen Other (See Comments)    Liver dysfunction  . Aspirin Other (See Comments)    Causes nosebleeds  . Theophylline Nausea And Vomiting  . Penicillins Itching    Family history and social history have been reviewed.  Review of Systems:   The remainder of the 10 point ROS is negative except as outlined in the H&P  Physical Exam: General Appearance Well developed, in no distress obese Eyes  Non icteric  HEENT  Non traumatic, normocephalic  Mouth No lesion, tongue papillated, no cheilosis edentulous Neck Supple without adenopathy, thyroid not enlarged, no carotid bruits, no JVD Lungs Clear to auscultation bilaterally COR Normal S1, normal S2, regular rhythm, no murmur, quiet precordium Abdomen obese soft, diffusely tender. Hepatomegaly. No ascites. Multiple ecchymosis and scratch marks. Enlarged spleen.  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 Last hemoglobin is down to 9 g. She is Hemoccult positive and reporting black stools. Last APC ablation of the AVMs was in September 2014. We will schedule her for upper endoscopy with ablation of AVMs. 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 Portal hypertension coagulopathy and intermittent and Zoloft. 3. She is to continue this  rifaximin and lactulose  History of  congestive heart  failure    [name] 10/11/2014

## 2014-10-19 NOTE — Interval H&P Note (Signed)
History and Physical Interval Note:  10/19/2014 10:09 PM  HALCYON HECK  has presented today for surgery, with the diagnosis of gib  The various methods of treatment have been discussed with the patient and family. After consideration of risks, benefits and other options for treatment, the patient has consented to  Procedure(s): ESOPHAGOGASTRODUODENOSCOPY (EGD) (N/A) HOT HEMOSTASIS (ARGON PLASMA COAGULATION/BICAP) (N/A) as a surgical intervention .  The patient's history has been reviewed, patient examined, no change in status, stable for surgery.  I have reviewed the patient's chart and labs.  Questions were answered to the patient's satisfaction.     [name]

## 2014-10-19 NOTE — Telephone Encounter (Signed)
PA in your IN box for completion.

## 2014-10-20 ENCOUNTER — Encounter (HOSPITAL_COMMUNITY): Payer: Self-pay

## 2014-10-20 ENCOUNTER — Encounter (HOSPITAL_COMMUNITY)
Admission: RE | Disposition: A | Discharge status: Home / Self Care | Payer: Self-pay | Source: Ambulatory Visit | Attending: Internal Medicine

## 2014-10-20 ENCOUNTER — Ambulatory Visit (HOSPITAL_COMMUNITY): Payer: Medicare Other | Admitting: Anesthesiology

## 2014-10-20 ENCOUNTER — Ambulatory Visit (HOSPITAL_COMMUNITY)
Admission: RE | Admit: 2014-10-20 | Discharge: 2014-10-20 | Disposition: A | Discharge status: Home / Self Care | Payer: Medicare Other | Source: Ambulatory Visit | Attending: Internal Medicine | Admitting: Internal Medicine

## 2014-10-20 DIAGNOSIS — D509 Iron deficiency anemia, unspecified: Secondary | ICD-10-CM | POA: Diagnosis not present

## 2014-10-20 DIAGNOSIS — J45909 Unspecified asthma, uncomplicated: Secondary | ICD-10-CM | POA: Insufficient documentation

## 2014-10-20 DIAGNOSIS — Z87891 Personal history of nicotine dependence: Secondary | ICD-10-CM | POA: Insufficient documentation

## 2014-10-20 DIAGNOSIS — M797 Fibromyalgia: Secondary | ICD-10-CM | POA: Insufficient documentation

## 2014-10-20 DIAGNOSIS — I5032 Chronic diastolic (congestive) heart failure: Secondary | ICD-10-CM | POA: Insufficient documentation

## 2014-10-20 DIAGNOSIS — K7581 Nonalcoholic steatohepatitis (NASH): Secondary | ICD-10-CM | POA: Insufficient documentation

## 2014-10-20 DIAGNOSIS — K3189 Other diseases of stomach and duodenum: Secondary | ICD-10-CM | POA: Diagnosis not present

## 2014-10-20 DIAGNOSIS — K7460 Unspecified cirrhosis of liver: Secondary | ICD-10-CM | POA: Diagnosis not present

## 2014-10-20 DIAGNOSIS — J449 Chronic obstructive pulmonary disease, unspecified: Secondary | ICD-10-CM | POA: Insufficient documentation

## 2014-10-20 DIAGNOSIS — F333 Major depressive disorder, recurrent, severe with psychotic symptoms: Secondary | ICD-10-CM | POA: Insufficient documentation

## 2014-10-20 DIAGNOSIS — Z9889 Other specified postprocedural states: Secondary | ICD-10-CM | POA: Diagnosis not present

## 2014-10-20 DIAGNOSIS — K766 Portal hypertension: Secondary | ICD-10-CM | POA: Insufficient documentation

## 2014-10-20 DIAGNOSIS — D688 Other specified coagulation defects: Secondary | ICD-10-CM | POA: Diagnosis not present

## 2014-10-20 DIAGNOSIS — I2510 Atherosclerotic heart disease of native coronary artery without angina pectoris: Secondary | ICD-10-CM | POA: Insufficient documentation

## 2014-10-20 DIAGNOSIS — K31819 Angiodysplasia of stomach and duodenum without bleeding: Secondary | ICD-10-CM

## 2014-10-20 DIAGNOSIS — Z79899 Other long term (current) drug therapy: Secondary | ICD-10-CM | POA: Diagnosis not present

## 2014-10-20 DIAGNOSIS — M810 Age-related osteoporosis without current pathological fracture: Secondary | ICD-10-CM | POA: Diagnosis not present

## 2014-10-20 DIAGNOSIS — G4733 Obstructive sleep apnea (adult) (pediatric): Secondary | ICD-10-CM | POA: Insufficient documentation

## 2014-10-20 DIAGNOSIS — K449 Diaphragmatic hernia without obstruction or gangrene: Secondary | ICD-10-CM | POA: Insufficient documentation

## 2014-10-20 DIAGNOSIS — K754 Autoimmune hepatitis: Secondary | ICD-10-CM | POA: Diagnosis not present

## 2014-10-20 DIAGNOSIS — K219 Gastro-esophageal reflux disease without esophagitis: Secondary | ICD-10-CM | POA: Insufficient documentation

## 2014-10-20 DIAGNOSIS — K922 Gastrointestinal hemorrhage, unspecified: Secondary | ICD-10-CM | POA: Diagnosis not present

## 2014-10-20 DIAGNOSIS — M1990 Unspecified osteoarthritis, unspecified site: Secondary | ICD-10-CM | POA: Diagnosis not present

## 2014-10-20 DIAGNOSIS — E119 Type 2 diabetes mellitus without complications: Secondary | ICD-10-CM | POA: Insufficient documentation

## 2014-10-20 DIAGNOSIS — E780 Pure hypercholesterolemia: Secondary | ICD-10-CM | POA: Diagnosis not present

## 2014-10-20 DIAGNOSIS — Z6839 Body mass index (BMI) 39.0-39.9, adult: Secondary | ICD-10-CM | POA: Diagnosis not present

## 2014-10-20 DIAGNOSIS — I10 Essential (primary) hypertension: Secondary | ICD-10-CM | POA: Diagnosis not present

## 2014-10-20 DIAGNOSIS — K31811 Angiodysplasia of stomach and duodenum with bleeding: Secondary | ICD-10-CM | POA: Diagnosis not present

## 2014-10-20 DIAGNOSIS — G2581 Restless legs syndrome: Secondary | ICD-10-CM | POA: Diagnosis not present

## 2014-10-20 HISTORY — PX: ESOPHAGOGASTRODUODENOSCOPY: SHX5428

## 2014-10-20 HISTORY — PX: HOT HEMOSTASIS: SHX5433

## 2014-10-20 LAB — GLUCOSE, CAPILLARY: GLUCOSE-CAPILLARY: 174 mg/dL — AB (ref 70–99)

## 2014-10-20 SURGERY — EGD (ESOPHAGOGASTRODUODENOSCOPY)
Anesthesia: Monitor Anesthesia Care

## 2014-10-20 MED ORDER — PROPOFOL 10 MG/ML IV BOLUS
INTRAVENOUS | Status: AC
Start: 2014-10-20 — End: 2014-10-20
  Filled 2014-10-20: qty 20

## 2014-10-20 MED ORDER — EPHEDRINE SULFATE 50 MG/ML IJ SOLN
INTRAMUSCULAR | Status: AC
  Filled 2014-10-20: qty 1

## 2014-10-20 MED ORDER — PROPOFOL INFUSION 10 MG/ML OPTIME
INTRAVENOUS | Status: DC | PRN
  Administered 2014-10-20: 140 ug/kg/min via INTRAVENOUS

## 2014-10-20 MED ORDER — LIDOCAINE HCL (CARDIAC) 20 MG/ML IV SOLN
INTRAVENOUS | Status: DC | PRN
  Administered 2014-10-20: 75 mg via INTRAVENOUS

## 2014-10-20 MED ORDER — PROPOFOL 10 MG/ML IV BOLUS
INTRAVENOUS | Status: DC | PRN
  Administered 2014-10-20: 20 mg via INTRAVENOUS
  Administered 2014-10-20 (×2): 30 mg via INTRAVENOUS
  Administered 2014-10-20 (×2): 20 mg via INTRAVENOUS

## 2014-10-20 MED ORDER — PROPOFOL 10 MG/ML IV BOLUS
INTRAVENOUS | Status: AC
  Filled 2014-10-20: qty 20

## 2014-10-20 MED ORDER — LIDOCAINE HCL (CARDIAC) 20 MG/ML IV SOLN
INTRAVENOUS | Status: AC
Start: 2014-10-20 — End: 2014-10-20
  Filled 2014-10-20: qty 5

## 2014-10-20 MED ORDER — SODIUM CHLORIDE 0.9 % IJ SOLN
INTRAMUSCULAR | Status: AC
  Filled 2014-10-20: qty 10

## 2014-10-20 MED ORDER — SODIUM CHLORIDE 0.9 % IV SOLN
INTRAVENOUS | Status: DC
  Administered 2014-10-20: 1000 mL via INTRAVENOUS

## 2014-10-20 NOTE — Telephone Encounter (Signed)
Filled and in Kim's box. 

## 2014-10-20 NOTE — Anesthesia Postprocedure Evaluation (Signed)
  Anesthesia Post-op Note  Patient: [name]  Procedure(s) Performed: Procedure(s) (LRB): ESOPHAGOGASTRODUODENOSCOPY (EGD) (N/A) HOT HEMOSTASIS (ARGON PLASMA COAGULATION/BICAP) (N/A)  Patient Location: PACU  Anesthesia Type: MAC  Level of Consciousness: awake and alert   Airway and Oxygen Therapy: Patient Spontanous Breathing  Post-op Pain: mild  Post-op Assessment: Post-op Vital signs reviewed, Patient's Cardiovascular Status Stable, Respiratory Function Stable, Patent Airway and No signs of Nausea or vomiting  Last Vitals:  Filed Vitals:   10/20/14 1010  BP: 139/50  Pulse: 55  Temp:   Resp: 15    Post-op Vital Signs: stable   Complications: No apparent anesthesia complications

## 2014-10-20 NOTE — Interval H&P Note (Signed)
History and Physical Interval Note:  10/20/2014 8:42 AM  [name]  has presented today for surgery, with the diagnosis of gib  The various methods of treatment have been discussed with the patient and family. After consideration of risks, benefits and other options for treatment, the patient has consented to  Procedure(s): ESOPHAGOGASTRODUODENOSCOPY (EGD) (N/A) HOT HEMOSTASIS (ARGON PLASMA COAGULATION/BICAP) (N/A) as a surgical intervention .  The patient's history has been reviewed, patient examined, no change in status, stable for surgery.  I have reviewed the patient's chart and labs.  Questions were answered to the patient's satisfaction.     [name]

## 2014-10-20 NOTE — Telephone Encounter (Signed)
Form faxed. Will await determination. Patient notified.

## 2014-10-20 NOTE — Anesthesia Preprocedure Evaluation (Addendum)
Anesthesia Evaluation  Patient identified by MRN, date of birth, ID band Patient awake    Reviewed: Allergy & Precautions, H&P , NPO status , Patient's Chart, lab work & pertinent test results  History of Anesthesia Complications (+) PONV  Airway Mallampati: III  TM Distance: >3 FB Neck ROM: full    Dental  (+) Dental Advisory Given, Edentulous Upper, Edentulous Lower   Pulmonary shortness of breath, asthma , sleep apnea , resolved, COPD COPD inhaler, former smoker,  chronicbronchitis breath sounds clear to auscultation  Pulmonary exam normal       Cardiovascular Exercise Tolerance: Good hypertension, Pt. on home beta blockers and Pt. on medications + CAD and +CHF + Valvular Problems/Murmurs AS Rhythm:regular Rate:Normal  MildAS. Chronic diastolic heart failure. EF 55%. Non-obstructive CAD.   Neuro/Psych Anxiety Depression negative neurological ROS  negative psych ROS   GI/Hepatic negative GI ROS, GERD-  Medicated and Controlled,(+) Cirrhosis -      ,   Endo/Other  diabetes, Well Controlled, Type 2, Insulin DependentMorbid obesity  Renal/GU negative Renal ROS  negative genitourinary   Musculoskeletal  (+) Arthritis -, Fibromyalgia -  Abdominal (+) + obese,   Peds  Hematology negative hematology ROS (+) anemia , Hg b 8.6   Anesthesia Other Findings   Reproductive/Obstetrics negative OB ROS                            Anesthesia Physical Anesthesia Plan  ASA: IV  Anesthesia Plan: MAC   Post-op Pain Management:    Induction:   Airway Management Planned:   Additional Equipment:   Intra-op Plan:   Post-operative Plan:   Informed Consent: I have reviewed the patients History and Physical, chart, labs and discussed the procedure including the risks, benefits and alternatives for the proposed anesthesia with the patient or authorized representative who has indicated his/her  understanding and acceptance.   Dental Advisory Given  Plan Discussed with: CRNA and Surgeon  Anesthesia Plan Comments:         Anesthesia Quick Evaluation

## 2014-10-20 NOTE — Transfer of Care (Signed)
Immediate Anesthesia Transfer of Care Note  Patient: PARILEE HALLY  Procedure(s) Performed: Procedure(s): ESOPHAGOGASTRODUODENOSCOPY (EGD) (N/A) HOT HEMOSTASIS (ARGON PLASMA COAGULATION/BICAP) (N/A)  Patient Location: PACU and Endoscopy Unit  Anesthesia Type:MAC  Level of Consciousness: awake, sedated, patient cooperative and responds to stimulation  Airway & Oxygen Therapy: Patient Spontanous Breathing and Patient connected to nasal cannula oxygen  Post-op Assessment: Report given to RN and Post -op Vital signs reviewed and stable  Post vital signs: Reviewed and stable  Last Vitals:  Filed Vitals:   10/20/14 0937  BP:   Pulse: 58  Temp: 36.4 C  Resp: 18    Complications: No apparent anesthesia complications

## 2014-10-21 ENCOUNTER — Encounter (HOSPITAL_COMMUNITY): Payer: Self-pay | Admitting: Internal Medicine

## 2014-10-23 NOTE — Op Note (Signed)
[facility] [place], [address]   ENDOSCOPY PROCEDURE REPORT  PATIENT: [name], [name]  MR#: [id number] BIRTHDATE: [birth year] , 73  yrs. old GENDER: female ENDOSCOPIST: Lafayette Dragon, MD REFERRED BY:  Dr  Danise Mina. PROCEDURE DATE:  10/20/2014 PROCEDURE:  EGD w/ ablation ASA CLASS:     Class III INDICATIONS:  iron deficiency anemia, GAVE, chronic GIB, last EGD with avn ablation 04/2013. MEDICATIONS: Monitored anesthesia care TOPICAL ANESTHETIC: Cetacaine Spray  DESCRIPTION OF PROCEDURE: After the risks benefits and alternatives of the procedure were thoroughly explained, informed consent was obtained.  The Pentax Gastroscope [id number] endoscope was introduced through the mouth and advanced to the second portion of the duodenum , Without limitations.  The instrument was slowly withdrawn as the mucosa was fully examined.    =Esophagus: esophageal mucosa was normal  throughout proximal, mid and distal esophagus, specifically, there were no esophageal varices, No stricture.,small hiatal hernia  1-2 cm.was present, suspecte Candida esophagitis lesions  Stomach:  there was mild gastropathy in gastric body  characterized by erythema, Gave lesions were present in gastric antrum and appeared in linear strips as well as clusters of nodules. Flat areas were treated with APC 20W, 1l/min, and stomach was suctioned to avoid over inflation, there was no active bleeding from the treated lesions. Multiple applications were carried out to assure complete ablation. Retroflexion of endoscope revealed  no evidence of gastric varices.  Duodenum: duodenal bulb and decending duodenum were normal. The scope was then withdrawn from the patient and the procedure completed.  COMPLICATIONS: There were no immediate complications.  ENDOSCOPIC IMPRESSION: 1. GAVE syndrome, gastric antru, 2. mild portal gastropathy 3.no esophageal or gastric varices 4.APC ablation of  vascular ectasias  carried out successfully 5. suspected Candida esophagitis  RECOMMENDATIONS: 1.continue PPI 2.resume home medications 3.follow H/H 4.continue Aranesp q 2 weeks 5. Diflucan 100mg , #5 1 po qd  REPEAT EXAM: as needed for recurrent GIB  eSigned:  Lafayette Dragon, MD 10/23/2014 5:57 PM   CC:  PATIENT NAME:  [name], [name] MR#: [id number]

## 2014-10-24 DIAGNOSIS — J449 Chronic obstructive pulmonary disease, unspecified: Secondary | ICD-10-CM | POA: Diagnosis not present

## 2014-10-24 DIAGNOSIS — S42019A Nondisplaced fracture of sternal end of unspecified clavicle, initial encounter for closed fracture: Secondary | ICD-10-CM | POA: Diagnosis not present

## 2014-10-24 NOTE — Telephone Encounter (Signed)
Xifaxin approved. Pharmacy notified.

## 2014-10-25 ENCOUNTER — Other Ambulatory Visit: Payer: Self-pay | Admitting: Family Medicine

## 2014-10-25 NOTE — Telephone Encounter (Signed)
Ok to refill 

## 2014-10-25 NOTE — Telephone Encounter (Signed)
plz phone in. 

## 2014-10-26 NOTE — Telephone Encounter (Signed)
Xanax called into [facility] per Dr Danise Mina approval.

## 2014-10-28 ENCOUNTER — Encounter (HOSPITAL_COMMUNITY)
Admission: RE | Admit: 2014-10-28 | Discharge: 2014-10-28 | Disposition: A | Discharge status: Home / Self Care | Payer: Medicare Other | Source: Ambulatory Visit | Attending: Pulmonary Disease | Admitting: Pulmonary Disease

## 2014-10-28 DIAGNOSIS — D649 Anemia, unspecified: Secondary | ICD-10-CM | POA: Insufficient documentation

## 2014-10-28 DIAGNOSIS — K7460 Unspecified cirrhosis of liver: Secondary | ICD-10-CM | POA: Diagnosis not present

## 2014-10-28 DIAGNOSIS — Q2733 Arteriovenous malformation of digestive system vessel: Secondary | ICD-10-CM | POA: Diagnosis not present

## 2014-10-28 DIAGNOSIS — N289 Disorder of kidney and ureter, unspecified: Secondary | ICD-10-CM | POA: Diagnosis not present

## 2014-10-28 LAB — POCT HEMOGLOBIN-HEMACUE: Hemoglobin: 9.1 g/dL — ABNORMAL LOW (ref 12.0–15.0)

## 2014-10-28 MED ORDER — DARBEPOETIN ALFA 200 MCG/0.4ML IJ SOSY
200.0000 ug | PREFILLED_SYRINGE | INTRAMUSCULAR | Status: DC
Start: 2014-10-28 — End: 2014-10-29
  Administered 2014-10-28: 200 ug via SUBCUTANEOUS

## 2014-10-29 MED ORDER — DARBEPOETIN ALFA 200 MCG/0.4ML IJ SOSY
PREFILLED_SYRINGE | INTRAMUSCULAR | Status: AC
  Filled 2014-10-29: qty 0.4

## 2014-11-02 ENCOUNTER — Telehealth: Payer: Self-pay | Admitting: Pulmonary Disease

## 2014-11-02 DIAGNOSIS — J449 Chronic obstructive pulmonary disease, unspecified: Secondary | ICD-10-CM | POA: Diagnosis not present

## 2014-11-02 MED ORDER — FLUTICASONE-SALMETEROL 250-50 MCG/DOSE IN AEPB: 1.0000 | INHALATION_SPRAY | Freq: Two times a day (BID) | RESPIRATORY_TRACT | Status: DC

## 2014-11-02 NOTE — Telephone Encounter (Signed)
advair 250 sample placed up front. Pt's husband aware.  Nothing further needed.

## 2014-11-06 DIAGNOSIS — J449 Chronic obstructive pulmonary disease, unspecified: Secondary | ICD-10-CM | POA: Diagnosis not present

## 2014-11-07 ENCOUNTER — Encounter: Payer: Self-pay | Admitting: Physician Assistant

## 2014-11-07 ENCOUNTER — Ambulatory Visit (INDEPENDENT_AMBULATORY_CARE_PROVIDER_SITE_OTHER): Payer: Medicare Other | Admitting: Physician Assistant

## 2014-11-07 ENCOUNTER — Telehealth: Payer: Self-pay | Admitting: *Deleted

## 2014-11-07 VITALS — BP 140/65 | HR 53 | Ht 64.0 in | Wt 223.4 lb

## 2014-11-07 DIAGNOSIS — E7800 Pure hypercholesterolemia, unspecified: Secondary | ICD-10-CM

## 2014-11-07 DIAGNOSIS — I10 Essential (primary) hypertension: Secondary | ICD-10-CM

## 2014-11-07 DIAGNOSIS — K31819 Angiodysplasia of stomach and duodenum without bleeding: Secondary | ICD-10-CM

## 2014-11-07 DIAGNOSIS — R0602 Shortness of breath: Secondary | ICD-10-CM | POA: Diagnosis not present

## 2014-11-07 DIAGNOSIS — R0789 Other chest pain: Secondary | ICD-10-CM

## 2014-11-07 DIAGNOSIS — I5032 Chronic diastolic (congestive) heart failure: Secondary | ICD-10-CM | POA: Diagnosis not present

## 2014-11-07 DIAGNOSIS — E119 Type 2 diabetes mellitus without complications: Secondary | ICD-10-CM

## 2014-11-07 DIAGNOSIS — D509 Iron deficiency anemia, unspecified: Secondary | ICD-10-CM | POA: Diagnosis not present

## 2014-11-07 LAB — BASIC METABOLIC PANEL
BUN: 53 mg/dL — AB (ref 6–23)
CHLORIDE: 98 meq/L (ref 96–112)
CO2: 32 mEq/L (ref 19–32)
Calcium: 9.2 mg/dL (ref 8.4–10.5)
Creatinine, Ser: 1.74 mg/dL — ABNORMAL HIGH (ref 0.40–1.20)
GFR: 30.63 mL/min — AB (ref >60.00)
GLUCOSE: 518 mg/dL — AB (ref 70–99)
Potassium: 4.1 mEq/L (ref 3.5–5.1)
Sodium: 134 mEq/L — ABNORMAL LOW (ref 135–145)

## 2014-11-07 MED ORDER — NITROGLYCERIN 0.4 MG SL SUBL: 0.4000 mg | SUBLINGUAL_TABLET | SUBLINGUAL | Status: DC | PRN

## 2014-11-07 NOTE — Telephone Encounter (Signed)
Critical Glucose called in. Reviewed by Richardson Dopp, PA. Patient is to call PCP for Critical Glucose of 518. Orders also obtained as follows:  Hold Lisinopril for three days and then resume. Decrease dosage from Lasix  - Take Lasix 80 mg by mouth daily for three days. Return on Thursday, 11/10/14 for BMET.  Attempted to notify patient without success. LMTCB 11/07/14 @ 3:45 pm.

## 2014-11-07 NOTE — Patient Instructions (Addendum)
Your physician recommends that you return for lab work: TODAY Artist)  Your physician has recommended you make the following change in your medication:  1) START Nitroglycerin as needed for chest pain.    Your physician recommends that you schedule a follow-up appointment in: 6 - 8 weeks with Richardson Dopp on the same day as Dr. Burt Knack is in the office.

## 2014-11-07 NOTE — Progress Notes (Signed)
Cardiology Office Note   Date:  11/07/2014   ID:  [name], DOB Mar 31, 1941, MRN [id number]  PCP:  Ria Bush, MD  Cardiologist:  Dr. Sherren Mocha     Chief Complaint  Patient presents with  . Congestive Heart Failure    Follow Up     History of Present Illness: [name] is a 74 y.o. 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   Admitted for a/c diastolic CHF 0/0712 and then seen in the ED in 04/2013 with volume excess.   Admitted 04/2013 with worsening anemia and transfused with PRBCs. EGD demonstrated GAVE which was treated with APC.   Admitted 03/2014 with sternal fracture from a fall and acute hypoxic respiratory failure and acute encephalopathy related to UTI and probable pneumonia.  She had worsening creatinine during this admission.   Admitted 07/2014 with worsening shortness of breath secondary to bronchitis.  Last seen by Dr. Sherren Mocha 08/2014.  She underwent EGD and APC ablation of AVMs last month with Dr. Olevia Perches. She was also noted to have Candida esophagitis.  She returns for follow-up.   She arrives by wheelchair. She remains chronically short of breath. She is NYHA 3-3b. She did have to increase her Lasix to 80 mg twice a day. Her weight did come down nicely.  She is back on Lasix 80 mg in the morning and 40 mg in the afternoon. She has felt stable since that time. She has chronic chest symptoms. This is left-sided. She denies radiation. She notes it with activity and at rest. She has had some dental pain. She has very poor dentition. She sleeps on 3 pillows chronically without change. She denies PND. LE edema is unchanged. She denies syncope.   Studies: - LHC 11/2007: Dx 20%, proximal and mid RCA 20-30%, EF 50-55%.  - Echo 01/2013: EF 55-60%, Gr 2 DD, very mild AS  (mean 9 mmHg), mild AI, mild BAE, PASP 39.  Echo 08/14/14 - Mild LVH. EF 60-65%.  Wall motion was normal  - Aortic valve: There was mild regurgitation. - Mitral valve: Calcified annulus. - Left atrium: The atrium was moderately dilated. - Right atrium: The atrium was mildly dilated. - PA peak pressure: 39 mm Hg (S).    Past Medical History  Diagnosis Date  . Chronic airway obstruction, not elsewhere classified   . Unspecified chronic bronchitis   . Unspecified essential hypertension   . Non-obstructive CAD   . Palpitations   . Mild aortic stenosis     a. 03/2014 Valve area (VTI): 2.89 cm^2, Valve area (Vmax): 2.7 cm^2.  . Pure hypercholesterolemia   . Morbid obesity   . Esophageal reflux   . 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 Diverticulosis of colon (without mention of hemorrhage)   . Benign neoplasm of colon   . Autoimmune hepatitis   . Cystitis, unspecified   . Osteoarthrosis, unspecified whether generalized or localized, unspecified site   . Osteoporosis, unspecified   . Restless legs syndrome (RLS)   . Major depressive disorder, recurrent episode, severe, specified as with psychotic behavior   . Iron deficiency anemia secondary to blood loss (chronic)     a. frequent PRBC transfusions.  . Coarse tremors     a. arms.  . Carpal tunnel syndrome on both  sides   . Palpitations   . Chronic diastolic CHF (congestive heart failure)     a. 03/26/2014 Echo: EF 55-60%, no rwma, mild AS/AI, mod-sev Ca2+ MV annulus, mildly to mod dil LA.  [name] Asthma   . OSA (obstructive sleep apnea)     a. does not use CPAP.  [name] Type II diabetes mellitus   . H/O hiatal hernia   . 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 dx'd 1990's  . Adenomatous colon polyp   . Midsternal chest pain     a. conservatively managed ->poor candidate for cath/anticoagulation.  [name] Anxiety   . History of pneumonia   . AVM (arteriovenous malformation)    . Portal hypertensive gastropathy   . Falls frequently     completed HHPT/OT 06/2014, PT unmet goals  . UTI (lower urinary tract infection)   . Hiatal hernia   . GAVE (gastric antral vascular ectasia)   . Nonalcoholic steatohepatitis (NASH)   . Thrombocytopenia   . Protein calorie malnutrition   . Family history of adverse reaction to anesthesia     Mother and sister were hard to wake up    Past Surgical History  Procedure Laterality Date  . Hemorroidectomy    . Appendectomy    . Vaginal hysterectomy    . Breast biopsy      bilateral  . Cesarean section      x 3  . Appendectomy    . Esophagogastroduodenoscopy  06/12/2012    Procedure: ESOPHAGOGASTRODUODENOSCOPY (EGD);  Surgeon: Lafayette Dragon, MD;  Location: [facility];  Service: Endoscopy;  Laterality: N/A;  . Balloon dilation  06/12/2012    Procedure: BALLOON DILATION;  Surgeon: Lafayette Dragon, MD;  Location: [facility];  Service: Endoscopy;  Laterality: N/A;  ?balloon  . Esophagogastroduodenoscopy  09/10/2012    Procedure: ESOPHAGOGASTRODUODENOSCOPY (EGD);  Surgeon: Lafayette Dragon, MD;  Location: [facility];  Service: Endoscopy;  Laterality: N/A;  . Hot hemostasis  09/10/2012    Procedure: HOT HEMOSTASIS (ARGON PLASMA COAGULATION/BICAP);  Surgeon: Lafayette Dragon, MD;  Location: [facility];  Service: Endoscopy;  Laterality: N/A;  . Esophagogastroduodenoscopy N/A 01/18/2013    Procedure: ESOPHAGOGASTRODUODENOSCOPY (EGD);  Surgeon: Lafayette Dragon, MD;  Location: [facility];  Service: Endoscopy;  Laterality: N/A;  . Esophagogastroduodenoscopy N/A 05/24/2013    Procedure: ESOPHAGOGASTRODUODENOSCOPY (EGD);  Surgeon: Jerene Bears, MD;  Location: [facility];  Service: Gastroenterology;  Laterality: N/A;  . Hot hemostasis N/A 05/24/2013    Procedure: HOT HEMOSTASIS (ARGON PLASMA COAGULATION/BICAP);  Surgeon: Jerene Bears, MD;  Location: [facility];  Service: Gastroenterology;  Laterality: N/A;  . US echocardiography  07/2014     mild LVH, EF 60-65%, normal wall motion, mild AR, mod dilated LA, mildly dilated RA, peak PA pressure 61mmHg  . Esophagogastroduodenoscopy N/A 10/20/2014    Procedure: ESOPHAGOGASTRODUODENOSCOPY (EGD);  Surgeon: Lafayette Dragon, MD;  Location: [facility];  Service: Endoscopy;  Laterality: N/A;  . Hot hemostasis N/A 10/20/2014    Procedure: HOT HEMOSTASIS (ARGON PLASMA COAGULATION/BICAP);  Surgeon: Lafayette Dragon, MD;  Location: [facility];  Service: Endoscopy;  Laterality: N/A;     Current Outpatient Prescriptions  Medication Sig Dispense Refill  . albuterol (PROAIR HFA) 108 (90 BASE) MCG/ACT inhaler Inhale 2 puffs into the lungs every 6 (six) hours as needed for wheezing or shortness of breath.     . ALPRAZolam (XANAX) 1 MG tablet TAKE 1 TABLET EVERY 8 HOURS AS NEEDED FOR ANXIETY  30 tablet 0  . b complex vitamins tablet Take 1 tablet by mouth daily.     [name] buPROPion (WELLBUTRIN XL) 150 MG 24 hr tablet Take 150 mg by mouth daily.    . darbepoetin (ARANESP) 200 MCG/0.4ML SOLN injection Inject 200 mcg into the skin every 14 (fourteen) days.    [name] desvenlafaxine (PRISTIQ) 50 MG 24 hr tablet Take 150 mg by mouth daily.     . diphenhydrAMINE-zinc acetate (BENADRYL) cream Apply topically 3 (three) times daily as needed for itching. 28.4 g 0  . Fluticasone-Salmeterol (ADVAIR DISKUS) 250-50 MCG/DOSE AEPB Inhale 1 puff into the lungs 2 (two) times daily. 1 each 0  . Fluticasone-Salmeterol (ADVAIR) 250-50 MCG/DOSE AEPB Inhale 1 puff into the lungs 2 (two) times daily. 2 each 0  . furosemide (LASIX) 40 MG tablet Take 80 mg by mouth 2 (two) times daily.     [name] glucose blood test strip 1 each by Other route as directed. Use to check sugar 4 times daily and as needed Dx: E10.40 **ONE TOUCH ULTRA** 200 each 3  . guaiFENesin (MUCINEX) 600 MG 12 hr tablet Take 600 mg by mouth 2 (two) times daily. 2 tablet by mouth once daily    . Insulin Glargine (LANTUS SOLOSTAR) 100 UNIT/ML Solostar Pen Inject 80 Units into the  skin at bedtime. 30 pen 3  . insulin lispro (HUMALOG) 100 UNIT/ML KiwkPen INJECT 15-30 UNITS INTO THE SKIN AT SUPPER PER SLIDING SCALE 30 pen 3  . Insulin Pen Needle (B-D ULTRAFINE III SHORT PEN) 31G X 8 MM MISC Use to inject insulin 2 times daily as instructed. 200 each 2  . lactulose, encephalopathy, (CHRONULAC) 10 GM/15ML SOLN Take 30 mLs (20 g total) by mouth 2 (two) times daily. constipation 2700 mL 6  . levalbuterol (XOPENEX) 0.63 MG/3ML nebulizer solution Take 0.63 mg by nebulization every 8 (eight) hours as needed for shortness of breath.   5  . lisinopril (PRINIVIL,ZESTRIL) 5 MG tablet Take 5 mg by mouth every evening.     . loratadine (CLARITIN) 10 MG tablet TAKE 1 TABLET EVERY DAY 30 tablet 3  . Multiple Vitamins-Minerals (CENTRUM SILVER PO) Take 1 tablet by mouth daily.     . mupirocin cream (BACTROBAN) 2 % APPLY 1 APPLICATION TOPICALLY 2 (TWO) TIMES DAILY. FOR SKIN LESIONS 30 g 0  . nadolol (CORGARD) 20 MG tablet TAKE 1/2 TABLET BY MOUTH DAILY 15 tablet 1  . NON FORMULARY 3 liters oxygen 24/7    . nystatin (MYCOSTATIN) 100000 UNIT/ML suspension Take 5 mLs (500,000 Units total) by mouth 4 (four) times daily. 120 mL 0  . ONE TOUCH LANCETS MISC Use to check sugar 4 times daily and as needed. JA:S50.53 **ONE TOUCH DELICA** 976 each 2  . oxyCODONE (OXY IR/ROXICODONE) 5 MG immediate release tablet Take one tablet by mouth every 4 hours as needed for pain 180 tablet 0  . pantoprazole (PROTONIX) 40 MG tablet Take 1 tablet (40 mg total) by mouth 2 (two) times daily. 60 tablet 6  . polyethylene glycol (MIRALAX / GLYCOLAX) packet Take 17 g by mouth daily as needed for moderate constipation.    . promethazine (PHENERGAN) 25 MG tablet Take 1 tablet (25 mg total) by mouth 2 (two) times daily as needed for nausea. 30 tablet 3  . QUEtiapine (SEROQUEL) 400 MG tablet Take 400 mg by mouth at bedtime.    [name] rOPINIRole (REQUIP) 4 MG tablet TAKE 1 TABLET BY MOUTH AT BEDTIME 30 tablet 2  .  Saline (OCEAN  NASAL SPRAY NA) Place 1 spray into the nose 3 (three) times daily as needed (congestion).     . simvastatin (ZOCOR) 40 MG tablet TAKE 1 TABLET BY MOUTH EVERY EVENING 30 tablet 2  . spironolactone (ALDACTONE) 25 MG tablet TAKE 0.5 TABLETS (12.5 MG TOTAL) BY MOUTH DAILY. 15 tablet 1  . tamsulosin (FLOMAX) 0.4 MG CAPS capsule Take 0.4 mg by mouth daily.    . Vitamin D, Ergocalciferol, (DRISDOL) 50000 UNITS CAPS capsule TAKE 1 CAPSULE (50,000 UNITS TOTAL) BY MOUTH EVERY 7 (SEVEN) DAYS. TAKE ON FRIDAY 5 capsule 3  . XIFAXAN 550 MG TABS tablet TAKE 1 TABLET (550 MG TOTAL) BY MOUTH DAILY. 30 tablet 2   No current facility-administered medications for this visit.    Allergies:   Acetaminophen; Aspirin; Theophylline; and Penicillins    Social History:  The patient  reports that she quit smoking about 22 years ago. Her smoking use included Cigarettes. She has a 37.5 pack-year smoking history. She has never used smokeless tobacco. She reports that she does not drink alcohol or use illicit drugs.   Family History:  The patient's family history includes Breast cancer in her maternal aunt and sister; Cervical cancer in her mother; Diabetes in her father, mother, and sister; Heart attack in her father and mother; Heart disease in her mother; Kidney disease in her mother; Multiple sclerosis in her other and sister; Stroke in her daughter and daughter. There is no history of Colon cancer.    ROS:   Please see the history of present illness.   Review of Systems  Cardiovascular: Positive for chest pain.  All other systems reviewed and are negative.    PHYSICAL EXAM: VS:  BP 140/65 mmHg  Pulse 53  Ht 5\' 4"  (1.626 m)  Wt 223 lb 6.4 oz (101.334 kg)  BMI 38.33 kg/m2  SpO2 99%    Wt Readings from Last 3 Encounters:  11/07/14 223 lb 6.4 oz (101.334 kg)  10/20/14 231 lb (104.781 kg)  10/11/14 231 lb (104.781 kg)     GEN: Well nourished, well developed, in no acute distress HEENT: normal Neck: no JVD  at 90 degrees, no masses Cardiac:  Normal S1/S2, RRR; 7-6/5 systolic murmur RUSB, no rubs or gallops, trace edema  Respiratory:  Decreased breath sounds bilaterally, no wheezing, rhonchi or rales. GI: soft, nontender, nondistended, + BS MS: no deformity or atrophy Skin: warm and dry  Neuro:  CNs II-XII intact, Strength and sensation are intact Psych: Normal affect   EKG:  EKG is ordered today.  It demonstrates:   Sinus brady, HR 53, LAD, septal Q waves, no change from prior tracing.    Recent Labs: 08/11/2014: Pro B Natriuretic peptide (BNP) 226.4*; TSH 1.970 08/12/2014: ALT 26 08/29/2014: B Natriuretic Peptide 233.0*; BUN 31*; Creatinine 1.11*; Platelets 62*; Potassium 3.7; Sodium 137 10/28/2014: Hemoglobin 9.1*    Lipid Panel    Component Value Date/Time   CHOL 108 03/22/2014 1103   TRIG 75.0 03/22/2014 1103   HDL 36.00* 03/22/2014 1103   CHOLHDL 3 03/22/2014 1103   VLDL 15.0 03/22/2014 1103   LDLCALC 57 03/22/2014 1103      ASSESSMENT AND PLAN:  Chronic diastolic CHF (congestive heart failure) Volume stable. Her weight is down 8 pounds since last seen.  Continue current dose of Lasix. Check follow-up basic metabolic panel today.  Shortness of breath Multifactorial and related to COPD, CHF, chronic anemia.  Essential hypertension Borderline control. Continue to monitor.  Anemia, iron  deficiency Secondary to GAVE (gastric antral vascular ectasia).  FU with GI.  Other chest pain  Somewhat atypical.  She is not a candidate for cardiac cath.  She cannot take antiplatelet agents given her chronic bleeding and she has CKD in addition to other comorbid conditions.  I have given her a Rx for NTG to use prn.  I instructed her how to use this.  If she has improvement, I would consider putting her on a long acting nitrate.  Current medicines are reviewed at length with the patient today.  The patient does not have concerns regarding medicines.  The following changes have been  made:  As above.   Labs/ tests ordered today include:   Orders Placed This Encounter  Procedures  . Basic Metabolic Panel (BMET)  . EKG 12-Lead    Disposition:   FU with me on a day Dr. Sherren Mocha is here in 6-8 weeks.   Signed, Versie Starks, MHS 11/07/2014 11:53 AM    [facility] [address], [place], [place]  [address] Phone: [phone]; Fax: [phone]

## 2014-11-09 ENCOUNTER — Encounter: Payer: Self-pay | Admitting: Endocrinology

## 2014-11-10 ENCOUNTER — Other Ambulatory Visit (INDEPENDENT_AMBULATORY_CARE_PROVIDER_SITE_OTHER): Payer: Medicare Other | Admitting: *Deleted

## 2014-11-10 DIAGNOSIS — E7800 Pure hypercholesterolemia, unspecified: Secondary | ICD-10-CM

## 2014-11-10 DIAGNOSIS — E119 Type 2 diabetes mellitus without complications: Secondary | ICD-10-CM

## 2014-11-10 DIAGNOSIS — I5032 Chronic diastolic (congestive) heart failure: Secondary | ICD-10-CM

## 2014-11-10 DIAGNOSIS — I10 Essential (primary) hypertension: Secondary | ICD-10-CM | POA: Diagnosis not present

## 2014-11-10 LAB — BASIC METABOLIC PANEL
BUN: 55 mg/dL — AB (ref 6–23)
CALCIUM: 9 mg/dL (ref 8.4–10.5)
CO2: 34 mEq/L — ABNORMAL HIGH (ref 19–32)
Chloride: 100 mEq/L (ref 96–112)
Creatinine, Ser: 1.53 mg/dL — ABNORMAL HIGH (ref 0.40–1.20)
GFR: 35.53 mL/min — ABNORMAL LOW (ref >60.00)
GLUCOSE: 276 mg/dL — AB (ref 70–99)
Potassium: 4 mEq/L (ref 3.5–5.1)
SODIUM: 138 meq/L (ref 135–145)

## 2014-11-10 NOTE — Addendum Note (Signed)
Addended by: Eulis Foster on: 11/10/2014 11:53 AM   Modules accepted: Orders

## 2014-11-11 ENCOUNTER — Telehealth: Payer: Self-pay | Admitting: *Deleted

## 2014-11-11 ENCOUNTER — Encounter: Payer: Self-pay | Admitting: *Deleted

## 2014-11-11 ENCOUNTER — Other Ambulatory Visit: Payer: Self-pay | Admitting: Family Medicine

## 2014-11-11 ENCOUNTER — Encounter (HOSPITAL_COMMUNITY)
Admission: RE | Admit: 2014-11-11 | Discharge: 2014-11-11 | Disposition: A | Discharge status: Home / Self Care | Payer: Medicare Other | Source: Ambulatory Visit | Attending: Family Medicine | Admitting: Family Medicine

## 2014-11-11 DIAGNOSIS — K7460 Unspecified cirrhosis of liver: Secondary | ICD-10-CM | POA: Diagnosis not present

## 2014-11-11 DIAGNOSIS — D649 Anemia, unspecified: Secondary | ICD-10-CM | POA: Diagnosis not present

## 2014-11-11 DIAGNOSIS — Q2733 Arteriovenous malformation of digestive system vessel: Secondary | ICD-10-CM | POA: Diagnosis not present

## 2014-11-11 DIAGNOSIS — N289 Disorder of kidney and ureter, unspecified: Secondary | ICD-10-CM | POA: Diagnosis not present

## 2014-11-11 LAB — POCT HEMOGLOBIN-HEMACUE: Hemoglobin: 9.1 g/dL — ABNORMAL LOW (ref 12.0–15.0)

## 2014-11-11 MED ORDER — DARBEPOETIN ALFA 200 MCG/0.4ML IJ SOSY
200.0000 ug | PREFILLED_SYRINGE | INTRAMUSCULAR | Status: DC
  Administered 2014-11-11: 200 ug via SUBCUTANEOUS

## 2014-11-11 NOTE — Telephone Encounter (Signed)
New message ° ° ° ° ° ° °Calling to get lab results °

## 2014-11-11 NOTE — Telephone Encounter (Signed)
pt notified about lab results and has been made aware to f/u w/PCP about elevated blood sugar 276. Pt verbalized understanding and thank you.

## 2014-11-11 NOTE — Telephone Encounter (Signed)
Rx called in as directed.   

## 2014-11-11 NOTE — Telephone Encounter (Signed)
error 

## 2014-11-11 NOTE — Telephone Encounter (Signed)
plz phone in. 

## 2014-11-12 MED ORDER — DARBEPOETIN ALFA 200 MCG/0.4ML IJ SOSY
PREFILLED_SYRINGE | INTRAMUSCULAR | Status: AC
  Filled 2014-11-12: qty 0.4

## 2014-11-14 ENCOUNTER — Telehealth: Payer: Self-pay

## 2014-11-14 ENCOUNTER — Other Ambulatory Visit: Payer: Self-pay | Admitting: Internal Medicine

## 2014-11-14 NOTE — Telephone Encounter (Signed)
This was handled by Julaine Hua, CMA. See note below.  Telephone Encounter Info    Author Note Status Last Update User Last Update Date/Time   Michae Kava, [place], [facility] 11/11/2014 12:05 PM    Telephone Encounter    Expand All Collapse All   pt notified about lab results and has been made aware to f/u w/PCP about elevated blood sugar 276. Pt verbalized understanding and thank you.

## 2014-11-14 NOTE — Telephone Encounter (Signed)
Josephine case mgr had pt on phone with her and left v/m requesting most recent BP and A1C. Spoke with Josephine and 09/22/14 BP 132/68 and no recent A1C noted. Josephine voiced understanding.

## 2014-11-15 ENCOUNTER — Other Ambulatory Visit: Payer: Self-pay | Admitting: Internal Medicine

## 2014-11-17 ENCOUNTER — Other Ambulatory Visit: Payer: Self-pay | Admitting: Family Medicine

## 2014-11-21 ENCOUNTER — Other Ambulatory Visit: Payer: Self-pay | Admitting: Internal Medicine

## 2014-11-22 ENCOUNTER — Other Ambulatory Visit: Payer: Self-pay | Admitting: *Deleted

## 2014-11-22 DIAGNOSIS — J449 Chronic obstructive pulmonary disease, unspecified: Secondary | ICD-10-CM | POA: Diagnosis not present

## 2014-11-22 DIAGNOSIS — S42019A Nondisplaced fracture of sternal end of unspecified clavicle, initial encounter for closed fracture: Secondary | ICD-10-CM | POA: Diagnosis not present

## 2014-11-22 NOTE — Patient Outreach (Signed)
Follow up phone call (covering for coworker Deloria Lair RN CM- requested f/u every 2 weeks).  Spoke with pt, HIPPA verified, relayed this RN CM covering for [employer] RN CM.   Pt reports had rough days lately,finally getting stabilized,states blood sugars run in 300's, not out of the ordinary.  Pt states she had a high reading of 501 last week, on an eating binge. Pt states she checks her blood sugars twice a day, has sliding scale insulin, uses as needed.  Pt states weights are stable,range  220-225lbs, no swelling, always have sob.  Pt reports she is being safe,doing exercise, taking it easy.   Plan:  Pt  to continue to check weights daily/record. Pt to continue to check blood sugars twice a day, sliding scale as needed.  RN CM discussed f/u with a home visit to which pt agreed, home visit scheduled for 4/13.

## 2014-11-23 ENCOUNTER — Other Ambulatory Visit: Payer: Self-pay | Admitting: Family Medicine

## 2014-11-25 ENCOUNTER — Encounter (HOSPITAL_COMMUNITY)
Admission: RE | Admit: 2014-11-25 | Discharge: 2014-11-25 | Disposition: A | Discharge status: Home / Self Care | Payer: Medicare Other | Source: Ambulatory Visit | Attending: Pulmonary Disease | Admitting: Pulmonary Disease

## 2014-11-25 ENCOUNTER — Encounter: Payer: Self-pay | Admitting: *Deleted

## 2014-11-25 ENCOUNTER — Other Ambulatory Visit: Payer: Self-pay | Admitting: Cardiovascular Disease

## 2014-11-25 DIAGNOSIS — K7460 Unspecified cirrhosis of liver: Secondary | ICD-10-CM | POA: Diagnosis not present

## 2014-11-25 DIAGNOSIS — Q2733 Arteriovenous malformation of digestive system vessel: Secondary | ICD-10-CM | POA: Diagnosis not present

## 2014-11-25 DIAGNOSIS — N289 Disorder of kidney and ureter, unspecified: Secondary | ICD-10-CM | POA: Diagnosis not present

## 2014-11-25 DIAGNOSIS — D649 Anemia, unspecified: Secondary | ICD-10-CM | POA: Diagnosis not present

## 2014-11-25 LAB — POCT HEMOGLOBIN-HEMACUE: Hemoglobin: 8.8 g/dL — ABNORMAL LOW (ref 12.0–15.0)

## 2014-11-25 MED ORDER — DARBEPOETIN ALFA 200 MCG/0.4ML IJ SOSY
PREFILLED_SYRINGE | INTRAMUSCULAR | Status: AC
  Filled 2014-11-25: qty 0.4

## 2014-11-25 MED ORDER — DARBEPOETIN ALFA 200 MCG/0.4ML IJ SOSY
200.0000 ug | PREFILLED_SYRINGE | INTRAMUSCULAR | Status: DC
  Administered 2014-11-25: 200 ug via SUBCUTANEOUS

## 2014-11-27 ENCOUNTER — Encounter (HOSPITAL_COMMUNITY): Payer: Self-pay | Admitting: Emergency Medicine

## 2014-11-27 ENCOUNTER — Emergency Department (HOSPITAL_COMMUNITY)
Admission: EM | Admit: 2014-11-27 | Discharge: 2014-11-27 | Disposition: A | Discharge status: Home / Self Care | Payer: Medicare Other | Attending: Emergency Medicine | Admitting: Emergency Medicine

## 2014-11-27 DIAGNOSIS — I10 Essential (primary) hypertension: Secondary | ICD-10-CM | POA: Diagnosis not present

## 2014-11-27 DIAGNOSIS — Z862 Personal history of diseases of the blood and blood-forming organs and certain disorders involving the immune mechanism: Secondary | ICD-10-CM | POA: Diagnosis not present

## 2014-11-27 DIAGNOSIS — Z8669 Personal history of other diseases of the nervous system and sense organs: Secondary | ICD-10-CM | POA: Insufficient documentation

## 2014-11-27 DIAGNOSIS — Z8739 Personal history of other diseases of the musculoskeletal system and connective tissue: Secondary | ICD-10-CM | POA: Diagnosis not present

## 2014-11-27 DIAGNOSIS — Z8701 Personal history of pneumonia (recurrent): Secondary | ICD-10-CM | POA: Diagnosis not present

## 2014-11-27 DIAGNOSIS — I2510 Atherosclerotic heart disease of native coronary artery without angina pectoris: Secondary | ICD-10-CM | POA: Insufficient documentation

## 2014-11-27 DIAGNOSIS — F419 Anxiety disorder, unspecified: Secondary | ICD-10-CM | POA: Insufficient documentation

## 2014-11-27 DIAGNOSIS — E119 Type 2 diabetes mellitus without complications: Secondary | ICD-10-CM | POA: Insufficient documentation

## 2014-11-27 DIAGNOSIS — Z79899 Other long term (current) drug therapy: Secondary | ICD-10-CM | POA: Insufficient documentation

## 2014-11-27 DIAGNOSIS — E780 Pure hypercholesterolemia: Secondary | ICD-10-CM | POA: Diagnosis not present

## 2014-11-27 DIAGNOSIS — Z87440 Personal history of urinary (tract) infections: Secondary | ICD-10-CM | POA: Insufficient documentation

## 2014-11-27 DIAGNOSIS — R040 Epistaxis: Secondary | ICD-10-CM | POA: Diagnosis not present

## 2014-11-27 DIAGNOSIS — K219 Gastro-esophageal reflux disease without esophagitis: Secondary | ICD-10-CM | POA: Diagnosis not present

## 2014-11-27 DIAGNOSIS — Z794 Long term (current) use of insulin: Secondary | ICD-10-CM | POA: Insufficient documentation

## 2014-11-27 DIAGNOSIS — J449 Chronic obstructive pulmonary disease, unspecified: Secondary | ICD-10-CM | POA: Insufficient documentation

## 2014-11-27 DIAGNOSIS — Z87448 Personal history of other diseases of urinary system: Secondary | ICD-10-CM | POA: Diagnosis not present

## 2014-11-27 DIAGNOSIS — I5032 Chronic diastolic (congestive) heart failure: Secondary | ICD-10-CM | POA: Diagnosis not present

## 2014-11-27 DIAGNOSIS — Z7951 Long term (current) use of inhaled steroids: Secondary | ICD-10-CM | POA: Insufficient documentation

## 2014-11-27 DIAGNOSIS — Z87891 Personal history of nicotine dependence: Secondary | ICD-10-CM | POA: Insufficient documentation

## 2014-11-27 MED ORDER — OXYMETAZOLINE HCL 0.05 % NA SOLN
2.0000 | Freq: Once | NASAL | Status: AC
  Administered 2014-11-27: 2 via NASAL
  Filled 2014-11-27: qty 15

## 2014-11-27 NOTE — ED Provider Notes (Signed)
CSN: [id number]     Arrival date & time 11/27/14  1024 History   None    No chief complaint on file.    (Consider location/radiation/quality/duration/timing/severity/associated sxs/prior Treatment) HPI Comments: Patient presents today with a chief complaint of epistaxis.  She reports that she started having bleeding from her right nare 1.5 hours ago after sticking a q tip in her nose in an attempt to clean out her nose.  She put a cotton ball soaked in saline in her nose in an attempt to stop the bleeding, but report that it has not been successful.  She states that she has seen ENT for this problem several years ago.  She reports that the epistaxis began occuring more frequently again over the past couple of months.  She is currently on chronic 3 L [place] Oxygen.  She does not use Vasoline in her nose to moisten it.  She denies any dyspnea, SOB, nausea, vomiting, or any other symptoms at this time.  The history is provided by the patient.    Past Medical History  Diagnosis Date  . Chronic airway obstruction, not elsewhere classified   . Unspecified chronic bronchitis   . Unspecified essential hypertension   . Non-obstructive CAD   . Palpitations   . Mild aortic stenosis     a. 03/2014 Valve area (VTI): 2.89 cm^2, Valve area (Vmax): 2.7 cm^2.  . Pure hypercholesterolemia   . Morbid obesity   . Esophageal reflux   . 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 Diverticulosis of colon (without mention of hemorrhage)   . Benign neoplasm of colon   . Autoimmune hepatitis   . Cystitis, unspecified   . Osteoarthrosis, unspecified whether generalized or localized, unspecified site   . Osteoporosis, unspecified   . Restless legs syndrome (RLS)   . Major depressive disorder, recurrent episode, severe, specified as with psychotic behavior   . Iron deficiency anemia secondary to blood loss (chronic)     a. frequent PRBC transfusions.  . Coarse tremors     a. arms.  .  Carpal tunnel syndrome on both sides   . Palpitations   . Chronic diastolic CHF (congestive heart failure)     a. 03/26/2014 Echo: EF 55-60%, no rwma, mild AS/AI, mod-sev Ca2+ MV annulus, mildly to mod dil LA.  [name] Asthma   . OSA (obstructive sleep apnea)     a. does not use CPAP.  [name] Type II diabetes mellitus   . H/O hiatal hernia   . 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 dx'd 1990's  . Adenomatous colon polyp   . Midsternal chest pain     a. conservatively managed ->poor candidate for cath/anticoagulation.  [name] Anxiety   . History of pneumonia   . AVM (arteriovenous malformation)   . Portal hypertensive gastropathy   . Falls frequently     completed HHPT/OT 06/2014, PT unmet goals  . UTI (lower urinary tract infection)   . Hiatal hernia   . GAVE (gastric antral vascular ectasia)   . Nonalcoholic steatohepatitis (NASH)   . Thrombocytopenia   . Protein calorie malnutrition   . Family history of adverse reaction to anesthesia     Mother and sister were hard to wake up   Past Surgical History  Procedure Laterality Date  . Hemorroidectomy    . Appendectomy    . Vaginal hysterectomy    . Breast biopsy  bilateral  . Cesarean section      x 3  . Appendectomy    . Esophagogastroduodenoscopy  06/12/2012    Procedure: ESOPHAGOGASTRODUODENOSCOPY (EGD);  Surgeon: Lafayette Dragon, MD;  Location: [facility];  Service: Endoscopy;  Laterality: N/A;  . Balloon dilation  06/12/2012    Procedure: BALLOON DILATION;  Surgeon: Lafayette Dragon, MD;  Location: [facility];  Service: Endoscopy;  Laterality: N/A;  ?balloon  . Esophagogastroduodenoscopy  09/10/2012    Procedure: ESOPHAGOGASTRODUODENOSCOPY (EGD);  Surgeon: Lafayette Dragon, MD;  Location: [facility];  Service: Endoscopy;  Laterality: N/A;  . Hot hemostasis  09/10/2012    Procedure: HOT HEMOSTASIS (ARGON PLASMA COAGULATION/BICAP);  Surgeon: Lafayette Dragon, MD;  Location: [facility];  Service: Endoscopy;   Laterality: N/A;  . Esophagogastroduodenoscopy N/A 01/18/2013    Procedure: ESOPHAGOGASTRODUODENOSCOPY (EGD);  Surgeon: Lafayette Dragon, MD;  Location: [facility];  Service: Endoscopy;  Laterality: N/A;  . Esophagogastroduodenoscopy N/A 05/24/2013    Procedure: ESOPHAGOGASTRODUODENOSCOPY (EGD);  Surgeon: Jerene Bears, MD;  Location: [place];  Service: Gastroenterology;  Laterality: N/A;  . Hot hemostasis N/A 05/24/2013    Procedure: HOT HEMOSTASIS (ARGON PLASMA COAGULATION/BICAP);  Surgeon: Jerene Bears, MD;  Location: [place];  Service: Gastroenterology;  Laterality: N/A;  . US echocardiography  07/2014    mild LVH, EF 60-65%, normal wall motion, mild AR, mod dilated LA, mildly dilated RA, peak PA pressure 61mmHg  . Esophagogastroduodenoscopy N/A 10/20/2014    Procedure: ESOPHAGOGASTRODUODENOSCOPY (EGD);  Surgeon: Lafayette Dragon, MD;  Location: [facility];  Service: Endoscopy;  Laterality: N/A;  . Hot hemostasis N/A 10/20/2014    Procedure: HOT HEMOSTASIS (ARGON PLASMA COAGULATION/BICAP);  Surgeon: Lafayette Dragon, MD;  Location: [facility];  Service: Endoscopy;  Laterality: N/A;   Family History  Problem Relation Age of Onset  . Heart disease Mother   . Cervical cancer Mother   . Kidney disease Mother   . Diabetes Mother   . Breast cancer Sister   . Multiple sclerosis Sister   . Colon cancer Neg Hx   . Breast cancer Maternal Aunt     x 2  . Diabetes Father   . Diabetes Sister   . Multiple sclerosis Other   . Heart attack Mother   . Heart attack Father   . Stroke Daughter   . Stroke Daughter    History  Substance Use Topics  . Smoking status: Former Smoker -- 1.50 packs/day for 25 years    Types: Cigarettes    Quit date: 08/26/1992  . Smokeless tobacco: Never Used  . Alcohol Use: No     Comment: 1/94/17 "last alcoholic drink was years ago"   OB History    No data available     Review of Systems  All other systems reviewed and are negative.     Allergies   Acetaminophen; Aspirin; Theophylline; and Penicillins  Home Medications   Prior to Admission medications   Medication Sig Start Date End Date Taking? Authorizing Provider  albuterol (PROAIR HFA) 108 (90 BASE) MCG/ACT inhaler Inhale 2 puffs into the lungs every 6 (six) hours as needed for wheezing or shortness of breath.     Historical Provider, MD  ALPRAZolam Duanne Moron) 1 MG tablet TAKE 1 TABLET BY MOUTH EVERY 8 HOURS AS NEEDED FOR ANXIETY 11/11/14   Ria Bush, MD  b complex vitamins tablet Take 1 tablet by mouth daily.     Historical Provider, MD  buPROPion (WELLBUTRIN XL) 150 MG 24 hr  tablet Take 150 mg by mouth daily.    Historical Provider, MD  darbepoetin (ARANESP) 200 MCG/0.4ML SOLN injection Inject 200 mcg into the skin every 14 (fourteen) days.    Historical Provider, MD  diphenhydrAMINE-zinc acetate (BENADRYL) cream Apply topically 3 (three) times daily as needed for itching. 08/16/14   Ripudeep Krystal Eaton, MD  Fluticasone-Salmeterol (ADVAIR DISKUS) 250-50 MCG/DOSE AEPB Inhale 1 puff into the lungs 2 (two) times daily. 11/02/14   Noralee Space, MD  Fluticasone-Salmeterol (ADVAIR) 250-50 MCG/DOSE AEPB Inhale 1 puff into the lungs 2 (two) times daily. 09/28/14   Noralee Space, MD  furosemide (LASIX) 40 MG tablet Take 80 mg by mouth 2 (two) times daily.  03/31/14   Belkys A Regalado, MD  glucose blood test strip 1 each by Other route as directed. Use to check sugar 4 times daily and as needed Dx: E10.40 **ONE TOUCH ULTRA** 09/30/14   Ria Bush, MD  guaiFENesin (MUCINEX) 600 MG 12 hr tablet Take 600 mg by mouth 2 (two) times daily. 2 tablet by mouth once daily    Historical Provider, MD  Insulin Glargine (LANTUS SOLOSTAR) 100 UNIT/ML Solostar Pen Inject 80 Units into the skin at bedtime. 08/17/14   Ria Bush, MD  insulin lispro (HUMALOG) 100 UNIT/ML KiwkPen INJECT 15-30 UNITS INTO THE SKIN AT SUPPER PER SLIDING SCALE 09/12/14   Ria Bush, MD  Insulin Pen Needle (B-D ULTRAFINE III  SHORT PEN) 31G X 8 MM MISC Use to inject insulin 2 times daily as instructed. 09/30/14   Ria Bush, MD  lactulose, encephalopathy, (CHRONULAC) 10 GM/15ML SOLN Take 30 mLs (20 g total) by mouth 2 (two) times daily. constipation 07/14/14   Lori P Hvozdovic, PA-C  levalbuterol (XOPENEX) 0.63 MG/3ML nebulizer solution Take 0.63 mg by nebulization every 8 (eight) hours as needed for shortness of breath.  06/05/14   Historical Provider, MD  lisinopril (PRINIVIL,ZESTRIL) 5 MG tablet Take 5 mg by mouth every evening.     Historical Provider, MD  loratadine (CLARITIN) 10 MG tablet TAKE 1 TABLET EVERY DAY 10/13/14   Ria Bush, MD  Multiple Vitamins-Minerals (CENTRUM SILVER PO) Take 1 tablet by mouth daily.     Historical Provider, MD  mupirocin cream (BACTROBAN) 2 % APPLY 1 APPLICATION TOPICALLY 2 (TWO) TIMES DAILY. FOR SKIN LESIONS 07/22/14   Ria Bush, MD  nadolol (CORGARD) 20 MG tablet TAKE 1/2 TABLET BY MOUTH DAILY 11/25/14   Sherren Mocha, MD  nitroGLYCERIN (NITROSTAT) 0.4 MG SL tablet Place 1 tablet (0.4 mg total) under the tongue every 5 (five) minutes as needed for chest pain. 11/07/14   Liliane Shi, PA-C  NON FORMULARY 3 liters oxygen 24/7    Historical Provider, MD  nystatin (MYCOSTATIN) 100000 UNIT/ML suspension Take 5 mLs (500,000 Units total) by mouth 4 (four) times daily. 09/14/14   Ria Bush, MD  ONE TOUCH LANCETS MISC Use to check sugar 4 times daily and as needed. UU:V25.36 **ONE TOUCH DELICA** 01/28/39   Ria Bush, MD  oxyCODONE (OXY IR/ROXICODONE) 5 MG immediate release tablet Take one tablet by mouth every 4 hours as needed for pain 07/22/14   Ria Bush, MD  pantoprazole (PROTONIX) 40 MG tablet Take 1 tablet (40 mg total) by mouth 2 (two) times daily. 07/07/14   Lori P Hvozdovic, PA-C  polyethylene glycol (MIRALAX / GLYCOLAX) packet Take 17 g by mouth daily as needed for moderate constipation.    Historical Provider, MD  PRISTIQ 50 MG 24 hr tablet TAKE 3  TABLETS BY MOUTH EVERY DAY 11/17/14   Ria Bush, MD  promethazine (PHENERGAN) 25 MG tablet Take 1 tablet (25 mg total) by mouth 2 (two) times daily as needed for nausea. 06/09/14   Ria Bush, MD  QUEtiapine (SEROQUEL) 400 MG tablet Take 400 mg by mouth at bedtime.    Historical Provider, MD  rOPINIRole (REQUIP) 4 MG tablet TAKE 1 TABLET BY MOUTH AT BEDTIME 08/01/14   Tiffany L Reed, DO  Saline (OCEAN NASAL SPRAY NA) Place 1 spray into the nose 3 (three) times daily as needed (congestion).     Historical Provider, MD  simvastatin (ZOCOR) 40 MG tablet TAKE 1 TABLET BY MOUTH EVERY EVENING 11/23/14   Ria Bush, MD  spironolactone (ALDACTONE) 25 MG tablet TAKE 0.5 TABLETS (12.5 MG TOTAL) BY MOUTH DAILY. 09/19/14   Sherren Mocha, MD  tamsulosin (FLOMAX) 0.4 MG CAPS capsule Take 0.4 mg by mouth daily.    Historical Provider, MD  Vitamin D, Ergocalciferol, (DRISDOL) 50000 UNITS CAPS capsule TAKE 1 CAPSULE (50,000 UNITS TOTAL) BY MOUTH EVERY 7 (SEVEN) DAYS. TAKE ON FRIDAY 05/16/14   Ria Bush, MD  XIFAXAN 550 MG TABS tablet TAKE 1 TABLET (550 MG TOTAL) BY MOUTH DAILY. 11/15/14   Lafayette Dragon, MD   BP 149/133 mmHg  Pulse 60  Temp(Src) 98.4 F (36.9 C) (Oral)  Resp 22  SpO2 97% Physical Exam  Constitutional: She appears well-developed and well-nourished.  HENT:  Head: Normocephalic and atraumatic.  Small amount of blood visualized in the oropharynx Small amount of bleeding of the left nare.  Neck: Normal range of motion. Neck supple.  Cardiovascular: Normal rate, regular rhythm and normal heart sounds.   Pulmonary/Chest: Effort normal and breath sounds normal.  Neurological: She is alert.  Skin: Skin is warm and dry.  Psychiatric: She has a normal mood and affect.  Nursing note and vitals reviewed.   ED Course  Procedures (including critical care time) Labs Review Labs Reviewed - No data to display  Imaging Review No results found.   EKG Interpretation None      11:36 AM Epistaxis appears to have resolved at this time.  No active bleeding visualized. 12:02 PM Reassessed patient.  She reports that she has not had anymore bleeding.  She ambulated in the room.  No bleeding visualized.  Nares patent. MDM   Final diagnoses:  None   Patient presents today with epistaxis.  She is currently not on any anticoagulants.  She reports that the bleeding began after she stuck a q-tip up her nose in an attempt to clean it.  Bleeding appears to be an anterior bleed.  Source of bleeding visualized in the anterior nare. Bleeding completely resolved with pressure and Afrin.  Feel that the patient is stable for discharge.  Patient instructed to continue using Afrin TID and also to use vasoline in her nares due to the fact that she is on chronic [place] Oxygen.  Patient also instructed to refrain from using q-tips in her nose.  Return precautions given.   Hyman Bible, PA-C 11/27/14 1227  Blanchie Dessert, MD 12/01/14 [phone]

## 2014-11-27 NOTE — ED Notes (Signed)
PA at bedside.

## 2014-11-27 NOTE — Discharge Instructions (Signed)
Continue using the Afrin spray three times a day for the next 3 days.  Use vasoline in the nostrils to help with the dryness from your oxygen.  Do not put q-tips up your nose.  If bleeding returns use Afrin and apply pressure.  Nosebleed A nosebleed can be caused by many things, including:  Getting hit hard in the nose.  Infections.  Dry nose.  Colds.  Medicines. Your doctor may do lab testing if you get nosebleeds a lot and the cause is not known. HOME CARE   If your nose was packed with material, keep it there until your doctor takes it out. Put the pack back in your nose if the pack falls out.  Do not blow your nose for 12 hours after the nosebleed.  Sit up and bend forward if your nose starts bleeding again. Pinch the front half of your nose nonstop for 20 minutes.  Put petroleum jelly inside your nose every morning if you have a dry nose.  Use a humidifier to make the air less dry.  Do not take aspirin.  Try not to strain, lift, or bend at the waist for many days after the nosebleed. GET HELP RIGHT AWAY IF:   Nosebleeds keep happening and are hard to stop or control.  You have bleeding or bruises that are not normal on other parts of the body.  You have a fever.  The nosebleeds get worse.  You get lightheaded, feel faint, sweaty, or throw up (vomit) blood. MAKE SURE YOU:   Understand these instructions.  Will watch your condition.  Will get help right away if you are not doing well or get worse. Document Released: 05/21/2008 Document Revised: 11/04/2011 Document Reviewed: 05/21/2008 [facility] Patient Information 2015 [place], [place]. This information is not intended to replace advice given to you by your health care provider. Make sure you discuss any questions you have with your health care provider.

## 2014-11-27 NOTE — ED Notes (Signed)
Pt not on thinners; nosebleed starting 1.5 ago. Has been holding pressure.

## 2014-11-28 ENCOUNTER — Other Ambulatory Visit: Payer: Self-pay | Admitting: Family Medicine

## 2014-11-28 NOTE — Telephone Encounter (Signed)
Rx called in as directed.   

## 2014-11-28 NOTE — Telephone Encounter (Signed)
plz phone in. 

## 2014-11-29 ENCOUNTER — Other Ambulatory Visit: Payer: Self-pay | Admitting: *Deleted

## 2014-11-29 ENCOUNTER — Telehealth: Payer: Self-pay | Admitting: Pulmonary Disease

## 2014-11-29 MED ORDER — FLUTICASONE-SALMETEROL 250-50 MCG/DOSE IN AEPB: 1.0000 | INHALATION_SPRAY | Freq: Two times a day (BID) | RESPIRATORY_TRACT | Status: DC

## 2014-11-29 NOTE — Telephone Encounter (Signed)
Spoke with pt, samples placed up front for pt.  Nothing further needed.

## 2014-11-29 NOTE — Patient Outreach (Signed)
Telephone call made on 4/4 at 9:10am:   This RN CM f/u with pt related to call received on 4/2 (weekend)- pt stated calling about my CHF, need to tell you what I have done and what I need to do now.   On 4/4, viewed in Epic pt had a ED visit for epistaxis.   Speaking with pt, pt states she had a nose bleed (4/3), could not get it to quit,did everything ENT MD said to do.  Pt states she called 24 hour nurse line- told need to get it to stop, f/u at ED.   This RN CM reviewed  with pt instructions given by ED- Afrin three times a day, Vaseline in nose, avoiding use of Q-tips in nose  to which pt said she is doing, using Q-tips only around edges to get Vaseline in.   Pt states she has no more bleeding.   RN CM to f/u with pt on 4/13 for scheduled home visit.     Zara Chess.   [place]  [phone]

## 2014-11-30 ENCOUNTER — Other Ambulatory Visit: Payer: Self-pay | Admitting: Family Medicine

## 2014-12-01 DIAGNOSIS — N3946 Mixed incontinence: Secondary | ICD-10-CM | POA: Diagnosis not present

## 2014-12-01 DIAGNOSIS — N3020 Other chronic cystitis without hematuria: Secondary | ICD-10-CM | POA: Diagnosis not present

## 2014-12-01 DIAGNOSIS — R339 Retention of urine, unspecified: Secondary | ICD-10-CM | POA: Diagnosis not present

## 2014-12-02 ENCOUNTER — Telehealth: Payer: Self-pay | Admitting: *Deleted

## 2014-12-02 ENCOUNTER — Other Ambulatory Visit: Payer: Self-pay | Admitting: *Deleted

## 2014-12-02 ENCOUNTER — Encounter (HOSPITAL_COMMUNITY): Payer: Medicare Other

## 2014-12-02 NOTE — Telephone Encounter (Signed)
Janci with [facility] called patient for an update today and was told by patient that she fell yesterday and landed on left buttocks and rolled over onto right side and now her ribs are sore and hurting like last time when she broke them. There is a noted small bruise on her side per caretaker. She also had a Urologist visit yesterday and was told that she had a UTI-but he didn't give her an abx because he said that she had had enough of them and was afraid she was going to develop resistance if he gave her more; She had [place] of bladder and kidneys and that was fine. Her sugars are very high (300-500) and her insulin isn't bringing it down 80 units lantus and 15-30 units humalog sliding scale; She said she ate too many sweets about 1 month ago and hasn't been able to get sugar back under control since. More fatigued and has less energy than usual. She said she feels hot on the outside, but when she takes her temp, it is normal. She said that is how she felt the last time she had a kidney infection that landed her in the ICU. Her left eye has been blurry x 3 days and has been red. It is looking better, but is still blurry. It does not itch or burn. Please call with what to do since it is the weekend. [phone]

## 2014-12-02 NOTE — Patient Outreach (Signed)
Assessment: RNCM called pt to make her aware that Biagio Borg MD or his assistant Maudie Mercury would be calling her back with further instructions.   Plan: Report to pt's regular RNCM pt's symptoms to make her aware of potential barriers.   Rutherford Limerick RN, BSN  [facility] [phone])

## 2014-12-02 NOTE — Patient Outreach (Signed)
Assessment: Outreach call made to [name] Borg MD pt's primary care MD, spoke with Maudie Mercury his assistant. Relayed all pt's symptoms and recent fall. Including: high sugars, left eye blurriness, bruising to affected side, increased SOB, decreased energy, increased swelling and report from pt of positive kidney infection according to urologist. Assistant stated she made a note of all symptoms and would route it directly to MD. She stated either she or MD would call pt with further instructions.  Plan: Call pt back to let her know MD's office would be calling her back with further instructions.  Rutherford Limerick RN, BSN  [facility] [phone])

## 2014-12-02 NOTE — Telephone Encounter (Signed)
Spoke with patient and she said she isn't in severe pain and felt that she could wait to be seen. Appt scheduled for Monday witht he understanding if worsening to go to ER.

## 2014-12-02 NOTE — Telephone Encounter (Signed)
If pt feels able to wait plz schedule appt on Monday. If severe pain rec ER evaluation.

## 2014-12-02 NOTE — Patient Outreach (Signed)
Assessment:  Pt called RNCM. Pt stated she had fallen last evening and was sore on her ribs that felt like the last time she broke her ribs. She stated her urologist had informed her yesterday she was positive for a kidney infection, but did not place pt on antibiotics at that time. Pt stated she was having blurriness in her left eye which was unusual for her. She stated her caregiver told her she had a bruise to her elbow and to the area on her sore ribs. RNCM requested to speak also with caregiver, who stated Ms. Roller's energy was extremely low, her blood sugars were higher than normal and unaffected by her normal doses of insulin. Caregiver also stated pt was more SOB and swollen than usual. At this time RNCM let the pt and caregiver know she was going to call the primary care physician J.Gutierrez to inform him of pt symptoms.   Plan: Call primary care physician Biagio Borg Plan: Call pt back with what MD suggests  Marykatherine Sherwood RN, BSN  [facility] [phone])

## 2014-12-03 DIAGNOSIS — J449 Chronic obstructive pulmonary disease, unspecified: Secondary | ICD-10-CM | POA: Diagnosis not present

## 2014-12-05 ENCOUNTER — Ambulatory Visit (INDEPENDENT_AMBULATORY_CARE_PROVIDER_SITE_OTHER)
Admission: RE | Admit: 2014-12-05 | Discharge: 2014-12-05 | Disposition: A | Discharge status: Home / Self Care | Payer: Medicare Other | Source: Ambulatory Visit | Attending: Family Medicine | Admitting: Family Medicine

## 2014-12-05 ENCOUNTER — Encounter: Payer: Self-pay | Admitting: Family Medicine

## 2014-12-05 ENCOUNTER — Ambulatory Visit (INDEPENDENT_AMBULATORY_CARE_PROVIDER_SITE_OTHER): Payer: Medicare Other | Admitting: Family Medicine

## 2014-12-05 VITALS — BP 112/60 | HR 52 | Temp 98.6°F | Resp 16 | Ht 64.0 in | Wt 226.5 lb

## 2014-12-05 DIAGNOSIS — R079 Chest pain, unspecified: Secondary | ICD-10-CM | POA: Diagnosis not present

## 2014-12-05 DIAGNOSIS — R0789 Other chest pain: Secondary | ICD-10-CM | POA: Insufficient documentation

## 2014-12-05 DIAGNOSIS — N3080 Other cystitis without hematuria: Secondary | ICD-10-CM | POA: Diagnosis not present

## 2014-12-05 DIAGNOSIS — B370 Candidal stomatitis: Secondary | ICD-10-CM | POA: Diagnosis not present

## 2014-12-05 DIAGNOSIS — R0781 Pleurodynia: Secondary | ICD-10-CM | POA: Diagnosis not present

## 2014-12-05 DIAGNOSIS — M797 Fibromyalgia: Secondary | ICD-10-CM

## 2014-12-05 DIAGNOSIS — IMO0002 Reserved for concepts with insufficient information to code with codable children: Secondary | ICD-10-CM

## 2014-12-05 DIAGNOSIS — N3090 Cystitis, unspecified without hematuria: Secondary | ICD-10-CM

## 2014-12-05 DIAGNOSIS — E1165 Type 2 diabetes mellitus with hyperglycemia: Secondary | ICD-10-CM

## 2014-12-05 DIAGNOSIS — M1990 Unspecified osteoarthritis, unspecified site: Secondary | ICD-10-CM

## 2014-12-05 MED ORDER — QUETIAPINE FUMARATE 200 MG PO TABS: 200.0000 mg | ORAL_TABLET | Freq: Every day | ORAL | Status: DC

## 2014-12-05 MED ORDER — PANTOPRAZOLE SODIUM 40 MG PO TBEC: 40.0000 mg | DELAYED_RELEASE_TABLET | Freq: Two times a day (BID) | ORAL | Status: DC

## 2014-12-05 MED ORDER — NYSTATIN 100000 UNIT/ML MT SUSP: 5.0000 mL | Freq: Four times a day (QID) | OROMUCOSAL | Status: DC

## 2014-12-05 MED ORDER — OXYCODONE HCL 5 MG PO TABS: ORAL_TABLET | ORAL | Status: DC

## 2014-12-05 NOTE — Progress Notes (Signed)
Pre visit review using our clinic review tool, if applicable. No additional management support is needed unless otherwise documented below in the visit note. 

## 2014-12-05 NOTE — Assessment & Plan Note (Signed)
Refilled nystatin swish/swallow

## 2014-12-05 NOTE — Assessment & Plan Note (Addendum)
H/o fracture in past - will check CXR after recent fall to r/o recurrent fracture, discussed anticipate more rib contusion. Continue robaxin, oxycodone (refilled today), heating pad.

## 2014-12-05 NOTE — Assessment & Plan Note (Signed)
Await urology records.

## 2014-12-05 NOTE — Patient Instructions (Addendum)
Look at [place] website (diabetes.org) for diabetic recipes. Schedule appointment with Dr Loanne Drilling for follow up diabetes.  Try lower seroquel dose at bedtime (200mg ). Update [place] with this effect. Nystatin suspension for thrush. Oxycodone refilled today.

## 2014-12-05 NOTE — Progress Notes (Signed)
BP 112/60 mmHg  Pulse 52  Temp(Src) 98.6 F (37 C) (Oral)  Resp 16  Ht 5\' 4"  (1.626 m)  Wt 226 lb 8 oz (102.74 kg)  BMI 38.86 kg/m2  SpO2 98%   CC: fall  Subjective:    Patient ID: [name], female    DOB: 1940/12/26, 74 y.o.   MRN: [id number]  HPI: [name] is a 74 y.o. female presenting on 12/05/2014 for Rib Injury   See Friday's phone note. [facility] called to update [place] on patient Friday at 4:45pm - pt fell 4/7, fell in bedroom while walking to thermostat to lower temperature, and landed on L buttock onto R side with small bruise and ribcage pain (similar site of prior fracture). Actually had another fall 5wks ago where she had forearm abrasion. Now healed.  Sugars have been running very elevated - 300-500s despite 80u lantus and 15-30u sliding scale insulin. Pt attributes to noncompliance with diet and sweets.   Saw urology last week - we have requested records today.  Relevant past medical, surgical, family and social history reviewed and updated as indicated. Interim medical history since our last visit reviewed. Allergies and medications reviewed and updated. Current Outpatient Prescriptions on File Prior to Visit  Medication Sig  . albuterol (PROAIR HFA) 108 (90 BASE) MCG/ACT inhaler Inhale 2 puffs into the lungs every 6 (six) hours as needed for wheezing or shortness of breath.   . ALPRAZolam (XANAX) 1 MG tablet TAKE 1 TABLET BY MOUTH EVERY 8 HOURS AS NEEDED FOR ANXIETY  . b complex vitamins tablet Take 1 tablet by mouth daily.   [name] buPROPion (WELLBUTRIN XL) 150 MG 24 hr tablet Take 150 mg by mouth daily.  . darbepoetin (ARANESP) 200 MCG/0.4ML SOLN injection Inject 200 mcg into the skin every 14 (fourteen) days.  . diphenhydrAMINE-zinc acetate (BENADRYL) cream Apply topically 3 (three) times daily as needed for itching.  . Fluticasone-Salmeterol (ADVAIR DISKUS) 250-50 MCG/DOSE AEPB Inhale 1 puff into the lungs 2 (two) times daily.  . furosemide (LASIX) 40 MG tablet  Take 80 mg by mouth 2 (two) times daily.   [name] glucose blood test strip 1 each by Other route as directed. Use to check sugar 4 times daily and as needed Dx: E10.40 **ONE TOUCH ULTRA**  . guaiFENesin (MUCINEX) 600 MG 12 hr tablet Take 600 mg by mouth 2 (two) times daily. 2 tablet by mouth once daily  . Insulin Glargine (LANTUS SOLOSTAR) 100 UNIT/ML Solostar Pen Inject 80 Units into the skin at bedtime.  . insulin lispro (HUMALOG) 100 UNIT/ML KiwkPen INJECT 15-30 UNITS INTO THE SKIN AT SUPPER PER SLIDING SCALE (Patient taking differently: INJECT 5-30 UNITS INTO THE SKIN AT SUPPER PER SLIDING SCALE (5 for every 50 over 150))  . Insulin Pen Needle (B-D ULTRAFINE III SHORT PEN) 31G X 8 MM MISC Use to inject insulin 2 times daily as instructed.  . lactulose, encephalopathy, (CHRONULAC) 10 GM/15ML SOLN Take 30 mLs (20 g total) by mouth 2 (two) times daily. constipation  . levalbuterol (XOPENEX) 0.63 MG/3ML nebulizer solution Take 0.63 mg by nebulization every 8 (eight) hours as needed for shortness of breath.   . lisinopril (PRINIVIL,ZESTRIL) 5 MG tablet Take 5 mg by mouth every evening.   . loratadine (CLARITIN) 10 MG tablet TAKE 1 TABLET EVERY DAY  . Multiple Vitamins-Minerals (CENTRUM SILVER PO) Take 1 tablet by mouth daily.   . nadolol (CORGARD) 20 MG tablet TAKE 1/2 TABLET BY MOUTH DAILY  . nitroGLYCERIN (  NITROSTAT) 0.4 MG SL tablet Place 1 tablet (0.4 mg total) under the tongue every 5 (five) minutes as needed for chest pain.  . NON FORMULARY 3 liters oxygen 24/7  . ONE TOUCH LANCETS MISC Use to check sugar 4 times daily and as needed. QQ:I29.79 **ONE TOUCH DELICA**  . polyethylene glycol (MIRALAX / GLYCOLAX) packet Take 17 g by mouth daily as needed for moderate constipation.  [name] PRISTIQ 50 MG 24 hr tablet TAKE 3 TABLETS BY MOUTH EVERY DAY  . promethazine (PHENERGAN) 25 MG tablet Take 1 tablet (25 mg total) by mouth 2 (two) times daily as needed for nausea.  [name] rOPINIRole (REQUIP) 4 MG tablet TAKE 1  TABLET BY MOUTH AT BEDTIME  . Saline (OCEAN NASAL SPRAY NA) Place 1 spray into the nose 3 (three) times daily as needed (congestion).   . simvastatin (ZOCOR) 40 MG tablet TAKE 1 TABLET BY MOUTH EVERY EVENING  . spironolactone (ALDACTONE) 25 MG tablet TAKE 0.5 TABLETS (12.5 MG TOTAL) BY MOUTH DAILY.  [name] Vitamin D, Ergocalciferol, (DRISDOL) 50000 UNITS CAPS capsule TAKE 1 CAPSULE (50,000 UNITS TOTAL) BY MOUTH EVERY 7 (SEVEN) DAYS. TAKE ON FRIDAY  . XIFAXAN 550 MG TABS tablet TAKE 1 TABLET (550 MG TOTAL) BY MOUTH DAILY.  [name] Fluticasone-Salmeterol (ADVAIR DISKUS) 250-50 MCG/DOSE AEPB Inhale 1 puff into the lungs 2 (two) times daily.  . Fluticasone-Salmeterol (ADVAIR) 250-50 MCG/DOSE AEPB Inhale 1 puff into the lungs 2 (two) times daily.  . mupirocin cream (BACTROBAN) 2 % APPLY 1 APPLICATION TOPICALLY 2 (TWO) TIMES DAILY. FOR SKIN LESIONS (Patient not taking: Reported on 12/05/2014)  . tamsulosin (FLOMAX) 0.4 MG CAPS capsule Take 0.4 mg by mouth daily.   No current facility-administered medications on file prior to visit.    Review of Systems Per HPI unless specifically indicated above     Objective:    BP 112/60 mmHg  Pulse 52  Temp(Src) 98.6 F (37 C) (Oral)  Resp 16  Ht 5\' 4"  (1.626 m)  Wt 226 lb 8 oz (102.74 kg)  BMI 38.86 kg/m2  SpO2 98%  Wt Readings from Last 3 Encounters:  12/05/14 226 lb 8 oz (102.74 kg)  11/07/14 223 lb 6.4 oz (101.334 kg)  10/20/14 231 lb (104.781 kg)    Physical Exam  Constitutional: She appears well-developed and well-nourished. No distress.  Morbidly obese in wheelchair  HENT:  Mouth/Throat: No oropharyngeal exudate.  White patches soft palate  Eyes: Conjunctivae and EOM are normal. Pupils are equal, round, and reactive to light.  Neck: Normal range of motion.  Cardiovascular: Normal rate, regular rhythm and intact distal pulses.   Murmur heard. Pulmonary/Chest: Effort normal and breath sounds normal. No respiratory distress. She has no wheezes. She  has no rales. She exhibits tenderness.  Tender to palpation bilateral posterior ribcage, point tenderness as well  Musculoskeletal: She exhibits no edema.  Lymphadenopathy:    She has no cervical adenopathy.  Nursing note and vitals reviewed.  Results for orders placed or performed during the hospital encounter of 11/25/14  Hemoglobin-hemacue, POC  Result Value Ref Range   Hemoglobin 8.8 (L) 12.0 - 15.0 g/dL   *Note: Due to a large number of results and/or encounters for the requested time period, some results have not been displayed. A complete set of results can be found in Results Review.      Assessment & Plan:   Problem List Items Addressed This Visit    Rib pain - Primary    H/o fracture in past -  will check CXR after recent fall to r/o recurrent fracture, discussed anticipate more rib contusion. Continue robaxin, oxycodone (refilled today), heating pad.       Relevant Orders   DG Chest 2 View (Completed)   Recurrent cystitis    Await urology records.      Osteoarthritis    Refilled oxycodone - will need controlled substance agreement at next visit..      Relevant Medications   oxyCODONE (Oxy IR/ROXICODONE) immediate release tablet   Oral thrush    Refilled nystatin swish/swallow      Relevant Medications   nystatin (MYCOSTATIN) 100000 UNIT/ML suspension   Fibromyalgia   Diabetes mellitus type 2, uncontrolled    Discussed diabetic diet and recipes, provided with resources to provide to daughter who occasionally cooks her meals and snacks. Encouraged she schedule appt with Dr Loanne Drilling as overdue.          Follow up plan: Return in about 3 months (around 03/06/2015), or as needed, for follow up visit.

## 2014-12-05 NOTE — Assessment & Plan Note (Signed)
Discussed diabetic diet and recipes, provided with resources to provide to daughter who occasionally cooks her meals and snacks. Encouraged she schedule appt with Dr Loanne Drilling as overdue.

## 2014-12-05 NOTE — Assessment & Plan Note (Signed)
Refilled oxycodone - will need controlled substance agreement at next visit.[name]

## 2014-12-06 ENCOUNTER — Telehealth: Payer: Self-pay | Admitting: Family Medicine

## 2014-12-06 NOTE — Telephone Encounter (Signed)
Pt called to get her xray results

## 2014-12-06 NOTE — Telephone Encounter (Signed)
See result note.  

## 2014-12-06 NOTE — Telephone Encounter (Signed)
Please advise 

## 2014-12-07 ENCOUNTER — Encounter: Payer: Self-pay | Admitting: *Deleted

## 2014-12-07 ENCOUNTER — Other Ambulatory Visit: Payer: Self-pay | Admitting: *Deleted

## 2014-12-07 VITALS — BP 152/80 | HR 51 | Temp 98.5°F | Resp 10 | Ht 64.0 in | Wt 225.0 lb

## 2014-12-07 DIAGNOSIS — E1065 Type 1 diabetes mellitus with hyperglycemia: Secondary | ICD-10-CM

## 2014-12-07 DIAGNOSIS — E108 Type 1 diabetes mellitus with unspecified complications: Principal | ICD-10-CM

## 2014-12-07 DIAGNOSIS — IMO0002 Reserved for concepts with insufficient information to code with codable children: Secondary | ICD-10-CM

## 2014-12-07 DIAGNOSIS — J449 Chronic obstructive pulmonary disease, unspecified: Secondary | ICD-10-CM | POA: Diagnosis not present

## 2014-12-07 NOTE — Telephone Encounter (Signed)
Message left advising patient.  

## 2014-12-08 ENCOUNTER — Other Ambulatory Visit: Payer: Self-pay | Admitting: Family Medicine

## 2014-12-08 DIAGNOSIS — I10 Essential (primary) hypertension: Secondary | ICD-10-CM

## 2014-12-08 DIAGNOSIS — E1165 Type 2 diabetes mellitus with hyperglycemia: Secondary | ICD-10-CM

## 2014-12-08 DIAGNOSIS — IMO0002 Reserved for concepts with insufficient information to code with codable children: Secondary | ICD-10-CM

## 2014-12-08 DIAGNOSIS — K754 Autoimmune hepatitis: Secondary | ICD-10-CM

## 2014-12-08 DIAGNOSIS — E7800 Pure hypercholesterolemia, unspecified: Secondary | ICD-10-CM

## 2014-12-08 DIAGNOSIS — M810 Age-related osteoporosis without current pathological fracture: Secondary | ICD-10-CM

## 2014-12-08 NOTE — Patient Outreach (Signed)
[facility]) Care Management   12/08/2014  NIKKA HAKIMIAN 1941-07-26 [id number]  BROOKLINN LONGBOTTOM is an 74 y.o. female   This RN CM covering  for coworker Deloria Lair RN CM, home visit done to f/u on recent ED visit (fall).   Subjective:  Pt reports doing better today than yesterday.   Pt states she fell last week, f/u with MD, xray done showed no new fractures.  Pt states no pain now.  Pt states with last week's fall, right leg gave out.  Pt states MD  reduced her Seroquel to 200 mg, take at night.   Pt states she f/u with Dr. Risa Grill last week, still have an infection (UTI), MD called in antibiotic yesterday and to pick up today.   Pt states her blood sugar this am was 260, been running high.  Pt states she did make an appointment with Dr. Loanne Drilling, to see 4/23.  Pt states she saw MD > a year ago. Pt states she takes sliding scale insulin at 2 pm, has not taken yet nor has checked blood sugar.   Pt states weights have been 225 lbs for the past week.    Objective:  Had pt check her blood sugar  during home visit at 2:59 pm- result was 414 (reports had oatmeal cookie 3 hrs. Earlier).   Pt gave herself 25 units of Humalog per sliding scale and shortly after had small serving of yogurt.   Had pt recheck blood sugar at approximately 4:35pm- result was 350.    Filed Vitals:   12/07/14 1529  BP: 152/80  Pulse: 51  Temp: 98.5 F (36.9 C)  Resp: 10    ROS  Physical Exam  Constitutional: She is oriented to person, place, and time. She appears well-developed.  Cardiovascular: Regular rhythm.   Respiratory: Breath sounds normal.  GI: Soft.  Neurological: She is alert and oriented to person, place, and time.  Skin: Skin is warm and dry.    Current Medications:   Current Outpatient Prescriptions  Medication Sig Dispense Refill  . albuterol (PROAIR HFA) 108 (90 BASE) MCG/ACT inhaler Inhale 2 puffs into the lungs every 6 (six) hours as needed for wheezing or shortness of  breath.     . ALPRAZolam (XANAX) 1 MG tablet TAKE 1 TABLET BY MOUTH EVERY 8 HOURS AS NEEDED FOR ANXIETY 30 tablet 0  . b complex vitamins tablet Take 1 tablet by mouth daily.     [name] buPROPion (WELLBUTRIN XL) 150 MG 24 hr tablet Take 150 mg by mouth daily.    . darbepoetin (ARANESP) 200 MCG/0.4ML SOLN injection Inject 200 mcg into the skin every 14 (fourteen) days.    . diphenhydrAMINE-zinc acetate (BENADRYL) cream Apply topically 3 (three) times daily as needed for itching. 28.4 g 0  . Fluticasone-Salmeterol (ADVAIR DISKUS) 250-50 MCG/DOSE AEPB Inhale 1 puff into the lungs 2 (two) times daily. 1 each 0  . Fluticasone-Salmeterol (ADVAIR DISKUS) 250-50 MCG/DOSE AEPB Inhale 1 puff into the lungs 2 (two) times daily. 2 each 0  . Fluticasone-Salmeterol (ADVAIR) 250-50 MCG/DOSE AEPB Inhale 1 puff into the lungs 2 (two) times daily. 2 each 0  . furosemide (LASIX) 40 MG tablet Take 80 mg by mouth 2 (two) times daily.     [name] glucose blood test strip 1 each by Other route as directed. Use to check sugar 4 times daily and as needed Dx: E10.40 **ONE TOUCH ULTRA** 200 each 3  . guaiFENesin (MUCINEX) 600 MG 12 hr  tablet Take 600 mg by mouth 2 (two) times daily. 2 tablet by mouth once daily    . Insulin Glargine (LANTUS SOLOSTAR) 100 UNIT/ML Solostar Pen Inject 80 Units into the skin at bedtime. 30 pen 3  . insulin lispro (HUMALOG) 100 UNIT/ML KiwkPen INJECT 15-30 UNITS INTO THE SKIN AT SUPPER PER SLIDING SCALE (Patient taking differently: INJECT 5-30 UNITS INTO THE SKIN AT SUPPER PER SLIDING SCALE (5 for every 50 over 150)) 30 pen 3  . Insulin Pen Needle (B-D ULTRAFINE III SHORT PEN) 31G X 8 MM MISC Use to inject insulin 2 times daily as instructed. 200 each 2  . lactulose, encephalopathy, (CHRONULAC) 10 GM/15ML SOLN Take 30 mLs (20 g total) by mouth 2 (two) times daily. constipation 2700 mL 6  . levalbuterol (XOPENEX) 0.63 MG/3ML nebulizer solution Take 0.63 mg by nebulization every 8 (eight) hours as needed for  shortness of breath.   5  . lisinopril (PRINIVIL,ZESTRIL) 5 MG tablet Take 5 mg by mouth every evening.     . loratadine (CLARITIN) 10 MG tablet TAKE 1 TABLET EVERY DAY 30 tablet 3  . Multiple Vitamins-Minerals (CENTRUM SILVER PO) Take 1 tablet by mouth daily.     . mupirocin cream (BACTROBAN) 2 % APPLY 1 APPLICATION TOPICALLY 2 (TWO) TIMES DAILY. FOR SKIN LESIONS (Patient not taking: Reported on 12/05/2014) 30 g 0  . nadolol (CORGARD) 20 MG tablet TAKE 1/2 TABLET BY MOUTH DAILY 15 tablet 1  . nitroGLYCERIN (NITROSTAT) 0.4 MG SL tablet Place 1 tablet (0.4 mg total) under the tongue every 5 (five) minutes as needed for chest pain. 25 tablet 3  . NON FORMULARY 3 liters oxygen 24/7    . nystatin (MYCOSTATIN) 100000 UNIT/ML suspension Take 5 mLs (500,000 Units total) by mouth 4 (four) times daily. 120 mL 0  . ONE TOUCH LANCETS MISC Use to check sugar 4 times daily and as needed. MW:N02.72 **ONE TOUCH DELICA** 536 each 2  . oxyCODONE (OXY IR/ROXICODONE) 5 MG immediate release tablet Take one tablet by mouth every 4 hours as needed for pain 60 tablet 0  . pantoprazole (PROTONIX) 40 MG tablet Take 1 tablet (40 mg total) by mouth 2 (two) times daily. 60 tablet 6  . polyethylene glycol (MIRALAX / GLYCOLAX) packet Take 17 g by mouth daily as needed for moderate constipation.    [name] PRISTIQ 50 MG 24 hr tablet TAKE 3 TABLETS BY MOUTH EVERY DAY 90 tablet 1  . promethazine (PHENERGAN) 25 MG tablet Take 1 tablet (25 mg total) by mouth 2 (two) times daily as needed for nausea. 30 tablet 3  . QUEtiapine (SEROQUEL) 200 MG tablet Take 1 tablet (200 mg total) by mouth at bedtime. 30 tablet 6  . rOPINIRole (REQUIP) 4 MG tablet TAKE 1 TABLET BY MOUTH AT BEDTIME 30 tablet 2  . Saline (OCEAN NASAL SPRAY NA) Place 1 spray into the nose 3 (three) times daily as needed (congestion).     . simvastatin (ZOCOR) 40 MG tablet TAKE 1 TABLET BY MOUTH EVERY EVENING 30 tablet 3  . spironolactone (ALDACTONE) 25 MG tablet TAKE 0.5  TABLETS (12.5 MG TOTAL) BY MOUTH DAILY. 15 tablet 1  . tamsulosin (FLOMAX) 0.4 MG CAPS capsule Take 0.4 mg by mouth daily.    . Vitamin D, Ergocalciferol, (DRISDOL) 50000 UNITS CAPS capsule TAKE 1 CAPSULE (50,000 UNITS TOTAL) BY MOUTH EVERY 7 (SEVEN) DAYS. TAKE ON FRIDAY 5 capsule 3  . XIFAXAN 550 MG TABS tablet TAKE 1 TABLET (550 MG TOTAL)  BY MOUTH DAILY. 30 tablet 1   No current facility-administered medications for this visit.     Falls: recent fall.    No flowsheet data found.  Assessment:  Recent fall, reports result of right leg giving out.  Use of walker discussed.                         DM:  Elevated blood sugars, noncompliance with diabetic diet.   Plan:   Pt to use walker when unsteady.             Pt to f/u with Dr. Loanne Drilling 4/23.             RN CM provided pt with Basic Carb sheet, reviewed good carb, portion sizes.              Plan to f/u with pt telephonically on 4/27.            Zara Chess.   [facility]  [phone]

## 2014-12-09 ENCOUNTER — Encounter (HOSPITAL_COMMUNITY)
Admission: RE | Admit: 2014-12-09 | Discharge: 2014-12-09 | Disposition: A | Discharge status: Home / Self Care | Payer: Medicare Other | Source: Ambulatory Visit | Attending: Family Medicine | Admitting: Family Medicine

## 2014-12-09 ENCOUNTER — Ambulatory Visit: Payer: Medicare Other | Admitting: *Deleted

## 2014-12-09 ENCOUNTER — Other Ambulatory Visit (HOSPITAL_COMMUNITY): Payer: Self-pay | Admitting: *Deleted

## 2014-12-09 DIAGNOSIS — Q2733 Arteriovenous malformation of digestive system vessel: Secondary | ICD-10-CM | POA: Diagnosis not present

## 2014-12-09 DIAGNOSIS — D649 Anemia, unspecified: Secondary | ICD-10-CM | POA: Diagnosis not present

## 2014-12-09 DIAGNOSIS — N289 Disorder of kidney and ureter, unspecified: Secondary | ICD-10-CM | POA: Diagnosis not present

## 2014-12-09 DIAGNOSIS — K7460 Unspecified cirrhosis of liver: Secondary | ICD-10-CM | POA: Diagnosis not present

## 2014-12-09 LAB — POCT HEMOGLOBIN-HEMACUE: Hemoglobin: 9.3 g/dL — ABNORMAL LOW (ref 12.0–15.0)

## 2014-12-09 MED ORDER — DARBEPOETIN ALFA 200 MCG/0.4ML IJ SOSY
PREFILLED_SYRINGE | INTRAMUSCULAR | Status: AC
  Filled 2014-12-09: qty 0.4

## 2014-12-09 MED ORDER — DARBEPOETIN ALFA 200 MCG/0.4ML IJ SOSY
200.0000 ug | PREFILLED_SYRINGE | INTRAMUSCULAR | Status: DC
  Administered 2014-12-09: 200 ug via SUBCUTANEOUS

## 2014-12-12 ENCOUNTER — Other Ambulatory Visit: Payer: Medicare Other

## 2014-12-13 ENCOUNTER — Encounter: Payer: Self-pay | Admitting: *Deleted

## 2014-12-13 ENCOUNTER — Other Ambulatory Visit (INDEPENDENT_AMBULATORY_CARE_PROVIDER_SITE_OTHER): Payer: Medicare Other

## 2014-12-13 DIAGNOSIS — IMO0002 Reserved for concepts with insufficient information to code with codable children: Secondary | ICD-10-CM

## 2014-12-13 DIAGNOSIS — E7800 Pure hypercholesterolemia, unspecified: Secondary | ICD-10-CM

## 2014-12-13 DIAGNOSIS — E1165 Type 2 diabetes mellitus with hyperglycemia: Secondary | ICD-10-CM | POA: Diagnosis not present

## 2014-12-13 DIAGNOSIS — M810 Age-related osteoporosis without current pathological fracture: Secondary | ICD-10-CM | POA: Diagnosis not present

## 2014-12-13 LAB — RENAL FUNCTION PANEL
ALBUMIN: 3.5 g/dL (ref 3.5–5.2)
BUN: 80 mg/dL — ABNORMAL HIGH (ref 6–23)
CALCIUM: 9.6 mg/dL (ref 8.4–10.5)
CO2: 32 meq/L (ref 19–32)
Chloride: 99 mEq/L (ref 96–112)
Creatinine, Ser: 1.76 mg/dL — ABNORMAL HIGH (ref 0.40–1.20)
GFR: 30.22 mL/min — ABNORMAL LOW (ref >60.00)
Glucose, Bld: 169 mg/dL — ABNORMAL HIGH (ref 70–99)
Phosphorus: 5.1 mg/dL — ABNORMAL HIGH (ref 2.3–4.6)
Potassium: 4.3 mEq/L (ref 3.5–5.1)
Sodium: 137 mEq/L (ref 135–145)

## 2014-12-13 LAB — LIPID PANEL
CHOL/HDL RATIO: 3
Cholesterol: 115 mg/dL (ref 0–200)
HDL: 40.5 mg/dL (ref >39.00)
LDL Cholesterol: 52 mg/dL (ref 0–99)
NonHDL: 74.5
TRIGLYCERIDES: 113 mg/dL (ref 0.0–149.0)
VLDL: 22.6 mg/dL (ref 0.0–40.0)

## 2014-12-13 LAB — VITAMIN D 25 HYDROXY (VIT D DEFICIENCY, FRACTURES): VITD: 74.74 ng/mL (ref 30.00–100.00)

## 2014-12-13 LAB — HEMOGLOBIN A1C: Hgb A1c MFr Bld: 7.2 % — ABNORMAL HIGH (ref 4.6–6.5)

## 2014-12-14 ENCOUNTER — Encounter: Payer: Self-pay | Admitting: Endocrinology

## 2014-12-14 ENCOUNTER — Ambulatory Visit (INDEPENDENT_AMBULATORY_CARE_PROVIDER_SITE_OTHER): Payer: Medicare Other | Admitting: Endocrinology

## 2014-12-14 VITALS — BP 126/70 | HR 56 | Temp 97.8°F | Ht 64.0 in | Wt 222.0 lb

## 2014-12-14 DIAGNOSIS — E1042 Type 1 diabetes mellitus with diabetic polyneuropathy: Secondary | ICD-10-CM

## 2014-12-14 NOTE — Patient Instructions (Addendum)
check your blood sugar twice a day.  vary the time of day when you check, between before the 3 meals, and at bedtime.  also check if you have symptoms of your blood sugar being too high or too low.  please keep a record of the readings and bring it to your next appointment here.  You can write it on any piece of paper.  please call us sooner if your blood sugar goes below 70, or if you have a lot of readings over 200. Please continue the same insulins for now. A different type of diabetes blood test is requested for you today.  We'll let you know about the results. Please come back for a follow-up appointment in 3 months.

## 2014-12-14 NOTE — Progress Notes (Signed)
Subjective:    Patient ID: [name], female    DOB: 03-01-1941, 74 y.o.   MRN: [id number]  HPI  Pt returns for f/u of diabetes mellitus: DM type: Insulin-requiring type 2 Dx'ed: 4332 Complications: polyneuropathy, foot ulcer, renal insufficiency, and CAD Therapy: insulin since 2010 GDM: never. DKA: never. Severe hypoglycemia: never.  Pancreatitis: never.  Other: she needs a simple insulin regimen, due to low functional state, but she takes her own insulin.  Interval history: she takes novolog only prn.  cbg record is downloaded from her meter, and scanned into the record.  She does not check cbg at hs.  Past Medical History  Diagnosis Date  . Chronic airway obstruction, not elsewhere classified   . Unspecified chronic bronchitis   . Unspecified essential hypertension   . Non-obstructive CAD   . Palpitations   . Mild aortic stenosis     a. 03/2014 Valve area (VTI): 2.89 cm^2, Valve area (Vmax): 2.7 cm^2.  . Pure hypercholesterolemia   . Morbid obesity   . Esophageal reflux   . 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 Diverticulosis of colon (without mention of hemorrhage)   . Benign neoplasm of colon   . Autoimmune hepatitis   . Cystitis, unspecified   . Osteoarthrosis, unspecified whether generalized or localized, unspecified site   . Osteoporosis, unspecified   . Restless legs syndrome (RLS)   . Major depressive disorder, recurrent episode, severe, specified as with psychotic behavior   . Iron deficiency anemia secondary to blood loss (chronic)     a. frequent PRBC transfusions.  . Coarse tremors     a. arms.  . Carpal tunnel syndrome on both sides   . Palpitations   . Chronic diastolic CHF (congestive heart failure)     a. 03/26/2014 Echo: EF 55-60%, no rwma, mild AS/AI, mod-sev Ca2+ MV annulus, mildly to mod dil LA.  [name] Asthma   . OSA (obstructive sleep apnea)     a. does not use CPAP.  [name] Type II diabetes mellitus   . H/O hiatal  hernia   . 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 dx'd 1990's  . Adenomatous colon polyp   . Midsternal chest pain     a. conservatively managed ->poor candidate for cath/anticoagulation.  [name] Anxiety   . History of pneumonia   . AVM (arteriovenous malformation)   . Portal hypertensive gastropathy   . Falls frequently     completed HHPT/OT 06/2014, PT unmet goals  . UTI (lower urinary tract infection)   . Hiatal hernia   . GAVE (gastric antral vascular ectasia)   . Nonalcoholic steatohepatitis (NASH)   . Thrombocytopenia   . Protein calorie malnutrition   . Family history of adverse reaction to anesthesia     Mother and sister were hard to wake up  . History of pneumonia     CAP 2015    Past Surgical History  Procedure Laterality Date  . Hemorroidectomy    . Appendectomy    . Vaginal hysterectomy    . Breast biopsy      bilateral  . Cesarean section      x 3  . Appendectomy    . Esophagogastroduodenoscopy  06/12/2012    Procedure: ESOPHAGOGASTRODUODENOSCOPY (EGD);  Surgeon: Lafayette Dragon, MD;  Location: [facility];  Service: Endoscopy;  Laterality: N/A;  . Balloon dilation  06/12/2012    Procedure: BALLOON DILATION;  Surgeon:  Lafayette Dragon, MD;  Location: [facility];  Service: Endoscopy;  Laterality: N/A;  ?balloon  . Esophagogastroduodenoscopy  09/10/2012    Procedure: ESOPHAGOGASTRODUODENOSCOPY (EGD);  Surgeon: Lafayette Dragon, MD;  Location: [facility];  Service: Endoscopy;  Laterality: N/A;  . Hot hemostasis  09/10/2012    Procedure: HOT HEMOSTASIS (ARGON PLASMA COAGULATION/BICAP);  Surgeon: Lafayette Dragon, MD;  Location: [facility];  Service: Endoscopy;  Laterality: N/A;  . Esophagogastroduodenoscopy N/A 01/18/2013    Procedure: ESOPHAGOGASTRODUODENOSCOPY (EGD);  Surgeon: Lafayette Dragon, MD;  Location: [facility];  Service: Endoscopy;  Laterality: N/A;  . Esophagogastroduodenoscopy N/A 05/24/2013    Procedure:  ESOPHAGOGASTRODUODENOSCOPY (EGD);  Surgeon: Jerene Bears, MD;  Location: [place];  Service: Gastroenterology;  Laterality: N/A;  . Hot hemostasis N/A 05/24/2013    Procedure: HOT HEMOSTASIS (ARGON PLASMA COAGULATION/BICAP);  Surgeon: Jerene Bears, MD;  Location: [place];  Service: Gastroenterology;  Laterality: N/A;  . US echocardiography  07/2014    mild LVH, EF 60-65%, normal wall motion, mild AR, mod dilated LA, mildly dilated RA, peak PA pressure 5mmHg  . Esophagogastroduodenoscopy N/A 10/20/2014    Procedure: ESOPHAGOGASTRODUODENOSCOPY (EGD);  Surgeon: Lafayette Dragon, MD;  Location: [facility];  Service: Endoscopy;  Laterality: N/A;  . Hot hemostasis N/A 10/20/2014    Procedure: HOT HEMOSTASIS (ARGON PLASMA COAGULATION/BICAP);  Surgeon: Lafayette Dragon, MD;  Location: [facility];  Service: Endoscopy;  Laterality: N/A;    History   Social History  . Marital Status: Married    Spouse Name: jerry Fadely  . Number of Children: 4  . Years of Education: 12   Occupational History  . Disabled   .     Social History Main Topics  . Smoking status: Former Smoker -- 1.50 packs/day for 25 years    Types: Cigarettes    Quit date: 08/26/1992  . Smokeless tobacco: Never Used  . Alcohol Use: No     Comment: 2/72/53 "last alcoholic drink was years ago"  . Drug Use: No  . Sexual Activity: No   Other Topics Concern  . Not on file   Social History Narrative   Patient lives at home with her husband [name]).    Retired.   Caffeine- None    Right handed.      Dr. Olevia Perches GI    Dr. Burt Knack cards    Dr. Loanne Drilling endo    Current Outpatient Prescriptions on File Prior to Visit  Medication Sig Dispense Refill  . albuterol (PROAIR HFA) 108 (90 BASE) MCG/ACT inhaler Inhale 2 puffs into the lungs every 6 (six) hours as needed for wheezing or shortness of breath.     [name] b complex vitamins tablet Take 1 tablet by mouth daily.     [name] buPROPion (WELLBUTRIN XL) 150 MG 24 hr tablet Take 150 mg  by mouth daily.    . darbepoetin (ARANESP) 200 MCG/0.4ML SOLN injection Inject 200 mcg into the skin every 14 (fourteen) days.    . diphenhydrAMINE-zinc acetate (BENADRYL) cream Apply topically 3 (three) times daily as needed for itching. 28.4 g 0  . Fluticasone-Salmeterol (ADVAIR DISKUS) 250-50 MCG/DOSE AEPB Inhale 1 puff into the lungs 2 (two) times daily. 1 each 0  . Fluticasone-Salmeterol (ADVAIR DISKUS) 250-50 MCG/DOSE AEPB Inhale 1 puff into the lungs 2 (two) times daily. 2 each 0  . Fluticasone-Salmeterol (ADVAIR) 250-50 MCG/DOSE AEPB Inhale 1 puff into the lungs 2 (two) times daily. 2 each 0  . furosemide (LASIX) 40 MG tablet Take  80 mg by mouth 2 (two) times daily.     [name] glucose blood test strip 1 each by Other route as directed. Use to check sugar 4 times daily and as needed Dx: E10.40 **ONE TOUCH ULTRA** 200 each 3  . guaiFENesin (MUCINEX) 600 MG 12 hr tablet Take 600 mg by mouth 2 (two) times daily. 2 tablet by mouth once daily    . Insulin Glargine (LANTUS SOLOSTAR) 100 UNIT/ML Solostar Pen Inject 80 Units into the skin at bedtime. 30 pen 3  . insulin lispro (HUMALOG) 100 UNIT/ML KiwkPen INJECT 15-30 UNITS INTO THE SKIN AT SUPPER PER SLIDING SCALE (Patient taking differently: INJECT 5-30 UNITS INTO THE SKIN AT SUPPER PER SLIDING SCALE (5 for every 50 over 150)) 30 pen 3  . Insulin Pen Needle (B-D ULTRAFINE III SHORT PEN) 31G X 8 MM MISC Use to inject insulin 2 times daily as instructed. 200 each 2  . lactulose, encephalopathy, (CHRONULAC) 10 GM/15ML SOLN Take 30 mLs (20 g total) by mouth 2 (two) times daily. constipation 2700 mL 6  . levalbuterol (XOPENEX) 0.63 MG/3ML nebulizer solution Take 0.63 mg by nebulization every 8 (eight) hours as needed for shortness of breath.   5  . lisinopril (PRINIVIL,ZESTRIL) 5 MG tablet Take 5 mg by mouth every evening.     . loratadine (CLARITIN) 10 MG tablet TAKE 1 TABLET EVERY DAY 30 tablet 3  . Multiple Vitamins-Minerals (CENTRUM SILVER PO) Take 1  tablet by mouth daily.     . mupirocin cream (BACTROBAN) 2 % APPLY 1 APPLICATION TOPICALLY 2 (TWO) TIMES DAILY. FOR SKIN LESIONS 30 g 0  . nadolol (CORGARD) 20 MG tablet TAKE 1/2 TABLET BY MOUTH DAILY 15 tablet 1  . nitroGLYCERIN (NITROSTAT) 0.4 MG SL tablet Place 1 tablet (0.4 mg total) under the tongue every 5 (five) minutes as needed for chest pain. 25 tablet 3  . NON FORMULARY 3 liters oxygen 24/7    . nystatin (MYCOSTATIN) 100000 UNIT/ML suspension Take 5 mLs (500,000 Units total) by mouth 4 (four) times daily. 120 mL 0  . ONE TOUCH LANCETS MISC Use to check sugar 4 times daily and as needed. QP:R91.63 **ONE TOUCH DELICA** 846 each 2  . oxyCODONE (OXY IR/ROXICODONE) 5 MG immediate release tablet Take one tablet by mouth every 4 hours as needed for pain 60 tablet 0  . pantoprazole (PROTONIX) 40 MG tablet Take 1 tablet (40 mg total) by mouth 2 (two) times daily. 60 tablet 6  . polyethylene glycol (MIRALAX / GLYCOLAX) packet Take 17 g by mouth daily as needed for moderate constipation.    [name] PRISTIQ 50 MG 24 hr tablet TAKE 3 TABLETS BY MOUTH EVERY DAY 90 tablet 1  . promethazine (PHENERGAN) 25 MG tablet Take 1 tablet (25 mg total) by mouth 2 (two) times daily as needed for nausea. 30 tablet 3  . QUEtiapine (SEROQUEL) 200 MG tablet Take 1 tablet (200 mg total) by mouth at bedtime. 30 tablet 6  . rOPINIRole (REQUIP) 4 MG tablet TAKE 1 TABLET BY MOUTH AT BEDTIME 30 tablet 2  . Saline (OCEAN NASAL SPRAY NA) Place 1 spray into the nose 3 (three) times daily as needed (congestion).     . simvastatin (ZOCOR) 40 MG tablet TAKE 1 TABLET BY MOUTH EVERY EVENING 30 tablet 3  . spironolactone (ALDACTONE) 25 MG tablet TAKE 0.5 TABLETS (12.5 MG TOTAL) BY MOUTH DAILY. 15 tablet 1  . tamsulosin (FLOMAX) 0.4 MG CAPS capsule Take 0.4 mg by mouth daily.    [name]  Vitamin D, Ergocalciferol, (DRISDOL) 50000 UNITS CAPS capsule TAKE 1 CAPSULE (50,000 UNITS TOTAL) BY MOUTH EVERY 7 (SEVEN) DAYS. TAKE ON FRIDAY 5 capsule 3  .  XIFAXAN 550 MG TABS tablet TAKE 1 TABLET (550 MG TOTAL) BY MOUTH DAILY. 30 tablet 1   No current facility-administered medications on file prior to visit.    Allergies  Allergen Reactions  . Acetaminophen Other (See Comments)    Liver dysfunction  . Aspirin Other (See Comments)    Causes nosebleeds  . Theophylline Nausea And Vomiting  . Penicillins Itching    Family History  Problem Relation Age of Onset  . Heart disease Mother   . Cervical cancer Mother   . Kidney disease Mother   . Diabetes Mother   . Breast cancer Sister   . Multiple sclerosis Sister   . Colon cancer Neg Hx   . Breast cancer Maternal Aunt     x 2  . Diabetes Father   . Diabetes Sister   . Multiple sclerosis Other   . Heart attack Mother   . Heart attack Father   . Stroke Daughter   . Stroke Daughter     BP 126/70 mmHg  Pulse 56  Temp(Src) 97.8 F (36.6 C) (Oral)  Ht 5\' 4"  (1.626 m)  Wt 222 lb (100.699 kg)  BMI 38.09 kg/m2  SpO2 97%   Review of Systems She denies hypoglycemia and weight change.     Objective:   Physical Exam VITAL SIGNS:  See vs page GENERAL: no distress.  In wheelchair.  Has 02 on.   Pulses: dorsalis pedis intact bilat.   MSK: no deformity of the feet CV: trace bilat leg edema Skin:  no ulcer on the feet.  normal color and temp on the feet. Neuro: sensation is intact to touch on the feet, but decreased from normal   Lab Results  Component Value Date   HGBA1C 7.2* 12/13/2014   Lab Results  Component Value Date   WBC 4.0 08/29/2014   HGB 9.3* 12/09/2014   HCT 32.3* 08/29/2014   MCV 99.4 08/29/2014   PLT 62* 08/29/2014       Assessment & Plan:  DM: worse a1c is much lower than cbg's would suggest.   Anemia: improved, but he prob has a high-rbc turnover state, on procrit, which may be suppressing a1c.  We'll check fructosamine.    Patient is advised the following: Patient Instructions  check your blood sugar twice a day.  vary the time of day when you  check, between before the 3 meals, and at bedtime.  also check if you have symptoms of your blood sugar being too high or too low.  please keep a record of the readings and bring it to your next appointment here.  You can write it on any piece of paper.  please call us sooner if your blood sugar goes below 70, or if you have a lot of readings over 200. Please continue the same insulins for now. A different type of diabetes blood test is requested for you today.  We'll let you know about the results. Please come back for a follow-up appointment in 3 months.

## 2014-12-15 ENCOUNTER — Other Ambulatory Visit: Payer: Self-pay | Admitting: Family Medicine

## 2014-12-15 NOTE — Telephone Encounter (Signed)
Ok to refill 

## 2014-12-15 NOTE — Telephone Encounter (Signed)
plz phone in. 

## 2014-12-15 NOTE — Telephone Encounter (Signed)
Rx called in as directed.   

## 2014-12-16 LAB — FRUCTOSAMINE: Fructosamine: 439 umol/L — ABNORMAL HIGH (ref 190–270)

## 2014-12-17 ENCOUNTER — Other Ambulatory Visit: Payer: Self-pay | Admitting: Cardiovascular Disease

## 2014-12-19 ENCOUNTER — Ambulatory Visit (INDEPENDENT_AMBULATORY_CARE_PROVIDER_SITE_OTHER): Payer: Medicare Other | Admitting: Family Medicine

## 2014-12-19 ENCOUNTER — Encounter: Payer: Self-pay | Admitting: Family Medicine

## 2014-12-19 VITALS — BP 100/60 | HR 52 | Temp 98.4°F | Ht 62.5 in | Wt 219.8 lb

## 2014-12-19 DIAGNOSIS — I5032 Chronic diastolic (congestive) heart failure: Secondary | ICD-10-CM

## 2014-12-19 DIAGNOSIS — E118 Type 2 diabetes mellitus with unspecified complications: Secondary | ICD-10-CM

## 2014-12-19 DIAGNOSIS — Z23 Encounter for immunization: Secondary | ICD-10-CM

## 2014-12-19 DIAGNOSIS — E7800 Pure hypercholesterolemia, unspecified: Secondary | ICD-10-CM

## 2014-12-19 DIAGNOSIS — D500 Iron deficiency anemia secondary to blood loss (chronic): Secondary | ICD-10-CM

## 2014-12-19 DIAGNOSIS — N183 Chronic kidney disease, stage 3 (moderate): Secondary | ICD-10-CM

## 2014-12-19 DIAGNOSIS — Z0000 Encounter for general adult medical examination without abnormal findings: Secondary | ICD-10-CM | POA: Insufficient documentation

## 2014-12-19 DIAGNOSIS — F333 Major depressive disorder, recurrent, severe with psychotic symptoms: Secondary | ICD-10-CM

## 2014-12-19 DIAGNOSIS — K5520 Angiodysplasia of colon without hemorrhage: Secondary | ICD-10-CM

## 2014-12-19 DIAGNOSIS — IMO0001 Reserved for inherently not codable concepts without codable children: Secondary | ICD-10-CM

## 2014-12-19 DIAGNOSIS — I10 Essential (primary) hypertension: Secondary | ICD-10-CM

## 2014-12-19 DIAGNOSIS — K31819 Angiodysplasia of stomach and duodenum without bleeding: Secondary | ICD-10-CM

## 2014-12-19 DIAGNOSIS — K219 Gastro-esophageal reflux disease without esophagitis: Secondary | ICD-10-CM

## 2014-12-19 DIAGNOSIS — J449 Chronic obstructive pulmonary disease, unspecified: Secondary | ICD-10-CM

## 2014-12-19 DIAGNOSIS — IMO0002 Reserved for concepts with insufficient information to code with codable children: Secondary | ICD-10-CM

## 2014-12-19 DIAGNOSIS — E1165 Type 2 diabetes mellitus with hyperglycemia: Secondary | ICD-10-CM

## 2014-12-19 DIAGNOSIS — E46 Unspecified protein-calorie malnutrition: Secondary | ICD-10-CM

## 2014-12-19 DIAGNOSIS — E1122 Type 2 diabetes mellitus with diabetic chronic kidney disease: Secondary | ICD-10-CM

## 2014-12-19 DIAGNOSIS — Z7189 Other specified counseling: Secondary | ICD-10-CM

## 2014-12-19 DIAGNOSIS — B370 Candidal stomatitis: Secondary | ICD-10-CM

## 2014-12-19 DIAGNOSIS — M810 Age-related osteoporosis without current pathological fracture: Secondary | ICD-10-CM

## 2014-12-19 MED ORDER — BUPROPION HCL ER (XL) 150 MG PO TB24: 150.0000 mg | ORAL_TABLET | Freq: Every day | ORAL | Status: DC

## 2014-12-19 NOTE — Assessment & Plan Note (Addendum)
Body mass index is 39.53 kg/(m^2). continue to encourage adherence to diabetic diet.

## 2014-12-19 NOTE — Assessment & Plan Note (Addendum)
Due to gastric antral vascular ectasia from autoimmune hepatitis/cirrhosis. Receives aranesp Q2 wks, blood transfusion of Hgb <8. No need for recent transfusion. Latest Hgb 9.2

## 2014-12-19 NOTE — Assessment & Plan Note (Signed)
Chronic, stable. bp slightly low today.continue to monitor.

## 2014-12-19 NOTE — Assessment & Plan Note (Signed)
No current evidence of persistent thrush. Continue nystatin swish/swallow

## 2014-12-19 NOTE — Assessment & Plan Note (Signed)
Hgb stable 

## 2014-12-19 NOTE — Assessment & Plan Note (Signed)
Pt states she had DEXA done a few years ago - will sign release for records of this.

## 2014-12-19 NOTE — Progress Notes (Signed)
f  BP 100/60 mmHg  Pulse 52  Temp(Src) 98.4 F (36.9 C) (Oral)  Ht 5' 2.5" (1.588 m)  Wt 219 lb 12 oz (99.678 kg)  BMI 39.53 kg/m2   CC: medicare wellness visit  Subjective:    Patient ID: [name], female    DOB: April 03, 1941, 74 y.o.   MRN: [id number]  HPI: [name] is a 74 y.o. female presenting on 12/19/2014 for Annual Exam   Hearing screen - passed Vision screen - 08/2014 at eye doctor Fall risk screen - failed - h/o multiple falls.  Depression screen - failed. On welbutrin, pristiq, xanax and seroquel. Followed by [facility] (ph [phone], fax [phone]).   Preventative: Colon cancer screening - pt unsure about latest colonoscopy, not in chart. Will check with Dr Olevia Perches about f/u due. Reviewed EGD and local capsule endscopy Breast cancer screening - h/o biopsies. Does not want to continue breast cancer screening.  Well woman exam - s/p hysterectomy. Wants to age out.  DEXA scan - endorses done at home last year and normal. Done by [place]. Flu shot - 04/2014 Tetanus shot - ? Pneumovax 08/2007, prevnar today. Shingles shot - ? Advanced directive discussion - has MOST form at home. HCPOA is husband and then daughter. Will bring me copy. Would want to be kept alive until son [name]) arrives from [place]. Seat belt use discussed Sunscreen use and skin screen discussed   Patient lives at home with her husband [name]).  Retired. Caffeine- None  Right handed. Activity: no regular exercise Diet: does not routinely follow diabetic diet  Relevant past medical, surgical, family and social history reviewed and updated as indicated. Interim medical history since our last visit reviewed. Allergies and medications reviewed and updated. Current Outpatient Prescriptions on File Prior to Visit  Medication Sig  . albuterol (PROAIR HFA) 108 (90 BASE) MCG/ACT inhaler Inhale 2 puffs into the lungs every 6 (six) hours as needed for wheezing or shortness of breath.   .  ALPRAZolam (XANAX) 1 MG tablet TAKE 1 TABLET EVERY 8 HOURS AS NEEDED FOR ANXIETY  . b complex vitamins tablet Take 1 tablet by mouth daily.   . darbepoetin (ARANESP) 200 MCG/0.4ML SOLN injection Inject 200 mcg into the skin every 14 (fourteen) days.  . diphenhydrAMINE-zinc acetate (BENADRYL) cream Apply topically 3 (three) times daily as needed for itching.  . Fluticasone-Salmeterol (ADVAIR) 250-50 MCG/DOSE AEPB Inhale 1 puff into the lungs 2 (two) times daily.  . furosemide (LASIX) 40 MG tablet Take 80 mg by mouth 2 (two) times daily.   [name] glucose blood test strip 1 each by Other route as directed. Use to check sugar 4 times daily and as needed Dx: E10.40 **ONE TOUCH ULTRA**  . guaiFENesin (MUCINEX) 600 MG 12 hr tablet Take 600 mg by mouth 2 (two) times daily. 2 tablet by mouth once daily  . Insulin Pen Needle (B-D ULTRAFINE III SHORT PEN) 31G X 8 MM MISC Use to inject insulin 2 times daily as instructed.  . lactulose, encephalopathy, (CHRONULAC) 10 GM/15ML SOLN Take 30 mLs (20 g total) by mouth 2 (two) times daily. constipation  . levalbuterol (XOPENEX) 0.63 MG/3ML nebulizer solution Take 0.63 mg by nebulization every 8 (eight) hours as needed for shortness of breath.   . lisinopril (PRINIVIL,ZESTRIL) 5 MG tablet Take 5 mg by mouth every evening.   . loratadine (CLARITIN) 10 MG tablet TAKE 1 TABLET EVERY DAY  . Multiple Vitamins-Minerals (CENTRUM SILVER PO) Take 1 tablet by mouth daily.   [name]  mupirocin cream (BACTROBAN) 2 % APPLY 1 APPLICATION TOPICALLY 2 (TWO) TIMES DAILY. FOR SKIN LESIONS  . nadolol (CORGARD) 20 MG tablet TAKE 1/2 TABLET BY MOUTH DAILY  . nitroGLYCERIN (NITROSTAT) 0.4 MG SL tablet Place 1 tablet (0.4 mg total) under the tongue every 5 (five) minutes as needed for chest pain.  . NON FORMULARY 3 liters oxygen 24/7  . nystatin (MYCOSTATIN) 100000 UNIT/ML suspension Take 5 mLs (500,000 Units total) by mouth 4 (four) times daily.  . ONE TOUCH LANCETS MISC Use to check sugar 4 times  daily and as needed. CH:Y85.02 **ONE TOUCH DELICA**  . oxyCODONE (OXY IR/ROXICODONE) 5 MG immediate release tablet Take one tablet by mouth every 4 hours as needed for pain  . pantoprazole (PROTONIX) 40 MG tablet Take 1 tablet (40 mg total) by mouth 2 (two) times daily.  . polyethylene glycol (MIRALAX / GLYCOLAX) packet Take 17 g by mouth daily as needed for moderate constipation.  [name] PRISTIQ 50 MG 24 hr tablet TAKE 3 TABLETS BY MOUTH EVERY DAY  . promethazine (PHENERGAN) 25 MG tablet Take 1 tablet (25 mg total) by mouth 2 (two) times daily as needed for nausea.  [name] QUEtiapine (SEROQUEL) 200 MG tablet Take 1 tablet (200 mg total) by mouth at bedtime.  [name] rOPINIRole (REQUIP) 4 MG tablet TAKE 1 TABLET BY MOUTH AT BEDTIME  . Saline (OCEAN NASAL SPRAY NA) Place 1 spray into the nose 3 (three) times daily as needed (congestion).   . simvastatin (ZOCOR) 40 MG tablet TAKE 1 TABLET BY MOUTH EVERY EVENING  . spironolactone (ALDACTONE) 25 MG tablet TAKE 1/2 TABLET BY MOUTH DAILY  . tamsulosin (FLOMAX) 0.4 MG CAPS capsule Take 0.4 mg by mouth daily.  . Vitamin D, Ergocalciferol, (DRISDOL) 50000 UNITS CAPS capsule TAKE 1 CAPSULE (50,000 UNITS TOTAL) BY MOUTH EVERY 7 (SEVEN) DAYS. TAKE ON FRIDAY  . XIFAXAN 550 MG TABS tablet TAKE 1 TABLET (550 MG TOTAL) BY MOUTH DAILY.   No current facility-administered medications on file prior to visit.    Review of Systems  Constitutional: Positive for chills and fatigue. Negative for fever, activity change, appetite change and unexpected weight change.  HENT: Negative for hearing loss.   Eyes: Positive for visual disturbance.  Respiratory: Positive for cough, chest tightness, shortness of breath and wheezing.   Cardiovascular: Negative for chest pain, palpitations and leg swelling.  Gastrointestinal: Positive for nausea and blood in stool. Negative for vomiting, abdominal pain, diarrhea, constipation and abdominal distention.  Genitourinary: Positive for hematuria.  Negative for difficulty urinating.  Musculoskeletal: Positive for arthralgias. Negative for myalgias and neck pain.  Skin: Negative for rash.  Neurological: Positive for dizziness and headaches. Negative for seizures and syncope.  Hematological: Negative for adenopathy. Bruises/bleeds easily.  Psychiatric/Behavioral: Positive for dysphoric mood. The patient is nervous/anxious.    Per HPI unless specifically indicated above     Objective:    BP 100/60 mmHg  Pulse 52  Temp(Src) 98.4 F (36.9 C) (Oral)  Ht 5' 2.5" (1.588 m)  Wt 219 lb 12 oz (99.678 kg)  BMI 39.53 kg/m2  Wt Readings from Last 3 Encounters:  12/19/14 219 lb 12 oz (99.678 kg)  12/14/14 222 lb (100.699 kg)  12/07/14 225 lb (102.059 kg)    Physical Exam  Constitutional: She is oriented to person, place, and time. She appears well-developed and well-nourished. No distress.  Obese CF, in wheelchair  HENT:  Head: Normocephalic and atraumatic.  Right Ear: Hearing, tympanic membrane, external ear and ear canal  normal.  Left Ear: Hearing, tympanic membrane, external ear and ear canal normal.  Nose: Nose normal.  Mouth/Throat: Uvula is midline, oropharynx is clear and moist and mucous membranes are normal. No oropharyngeal exudate, posterior oropharyngeal edema or posterior oropharyngeal erythema.  Eyes: Conjunctivae and EOM are normal. Pupils are equal, round, and reactive to light. No scleral icterus.  Neck: Normal range of motion. Neck supple. Carotid bruit is not present. No thyromegaly present.  Cardiovascular: Normal rate, regular rhythm and intact distal pulses.   Murmur (3/6 SEM) heard. Pulses:      Radial pulses are 2+ on the right side, and 2+ on the left side.  Pulmonary/Chest: Effort normal and breath sounds normal. No respiratory distress. She has no wheezes. She has no rales.  Breast - deferred per patient request  Abdominal: Soft. Bowel sounds are normal. She exhibits no distension and no mass. There is no  tenderness. There is no rebound and no guarding.  Genitourinary:  GYN - deferred per patient request  Musculoskeletal: Normal range of motion. She exhibits no edema.  Lymphadenopathy:    She has no cervical adenopathy.  Neurological: She is alert and oriented to person, place, and time.  CN grossly intact, station and gait intact Recall 3/3  calculation 5/5 serial 3s  Skin: Skin is warm and dry. No rash noted.  Psychiatric: She has a normal mood and affect. Her behavior is normal. Judgment and thought content normal.  Nursing note and vitals reviewed.  Results for orders placed or performed in visit on 12/14/14  Fructosamine  Result Value Ref Range   Fructosamine 439 (H) 190 - 270 umol/L   *Note: Due to a large number of results and/or encounters for the requested time period, some results have not been displayed. A complete set of results can be found in Results Review.      Assessment & Plan:   Problem List Items Addressed This Visit    Severe recurrent major depressive disorder with psychotic features    Continue xanax, pristiq, wellbutrin, and seroquel. Sees psychiatry.      Relevant Medications   buPROPion (WELLBUTRIN XL) 150 MG 24 hr tablet   Protein calorie malnutrition    Albumin has improved - continue to monitor.      Osteoporosis    Pt states she had DEXA done a few years ago - will sign release for records of this.      Oral thrush    No current evidence of persistent thrush. Continue nystatin swish/swallow      Obesity, Class II, BMI 35-39.9, with comorbidity    Body mass index is 39.53 kg/(m^2). continue to encourage adherence to diabetic diet.      Relevant Medications   Insulin Glargine (LANTUS SOLOSTAR) 100 UNIT/ML Solostar Pen   insulin lispro (HUMALOG) 100 UNIT/ML KiwkPen   Medicare annual wellness visit, subsequent - Primary    I have personally reviewed the Medicare Annual Wellness questionnaire and have noted 1. The patient's medical and  social history 2. Their use of alcohol, tobacco or illicit drugs 3. Their current medications and supplements 4. The patient's functional ability including ADL's, fall risks, home safety risks and hearing or visual impairment. 5. Diet and physical activity 6. Evidence for depression or mood disorders The patients weight, height, BMI have been recorded in the chart.  Hearing and vision has been addressed. I have made referrals, counseling and provided education to the patient based review of the above and I have provided the pt  with a written personalized care plan for preventive services. Provider list updated - see scanned questionairre. Reviewed preventative protocols and updated unless pt declined.       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 Receives aranesp Q2 wks, blood transfusion of Hgb <8. No need for recent transfusion. Latest Hgb 9.2      HYPERCHOLESTEROLEMIA (Chronic)    Reviewed #s - great control on current regimen.      Health maintenance examination    Preventative protocols reviewed and updated unless pt declined. Discussed healthy diet and lifestyle.       GERD    Continue PPI      GAVE (gastric antral vascular ectasia)    Hgb stable.      Essential hypertension    Chronic, stable. bp slightly low today.continue to monitor.      Diabetes mellitus type 2 with complications, uncontrolled    Reviewed recent fructosamine. 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 On 3L [place] chronically.      CKD stage 3 due to type 2 diabetes mellitus    Reviewed CKD diagnosis. Anticipate current  bump due to mild dehydration - encouraged increase water intake over next few days.      Relevant Medications   Insulin Glargine (LANTUS SOLOSTAR) 100 UNIT/ML Solostar Pen   insulin lispro (HUMALOG) 100 UNIT/ML KiwkPen   Chronic diastolic CHF (congestive heart failure) (Chronic)    Continue current regimen. euvolemic to somewhat dry today.      Angiodysplasia of intestine    Dx by endoscopy, followed by GI.      Advanced care planning/counseling discussion    Advanced directive discussion - has MOST form at home. HCPOA is husband and then daughter. Will bring me copy. Would want to be kept alive until son [name]) arrives from [place].       Other Visit Diagnoses    Need for vaccination with 13-polyvalent pneumococcal conjugate vaccine        Relevant Orders    Pneumococcal conjugate vaccine 13-valent (Completed)        Follow up plan: Return in about 3 months (around 03/20/2015), or as needed, for follow up visit.

## 2014-12-19 NOTE — Assessment & Plan Note (Signed)
Albumin has improved - continue to monitor.

## 2014-12-19 NOTE — Assessment & Plan Note (Signed)
Advanced directive discussion - has MOST form at home. HCPOA is husband and then daughter. Will bring me copy. Would want to be kept alive until son [name]) arrives from [place].

## 2014-12-19 NOTE — Assessment & Plan Note (Signed)
Preventative protocols reviewed and updated unless pt declined. Discussed healthy diet and lifestyle.  

## 2014-12-19 NOTE — Patient Instructions (Addendum)
prevnar today. Bring me a copy of updated advanced directive and health care power of attorney form. Sign release for records for bone density scan done by [facility].  Good to see you today, call us with questions.

## 2014-12-19 NOTE — Assessment & Plan Note (Signed)
Reviewed #s - great control on current regimen.

## 2014-12-19 NOTE — Progress Notes (Signed)
Pre visit review using our clinic review tool, if applicable. No additional management support is needed unless otherwise documented below in the visit note. 

## 2014-12-19 NOTE — Assessment & Plan Note (Signed)

## 2014-12-19 NOTE — Assessment & Plan Note (Signed)
Continue advair. On 3L [place] chronically.

## 2014-12-19 NOTE — Assessment & Plan Note (Signed)
Reviewed CKD diagnosis. Anticipate current bump due to mild dehydration - encouraged increase water intake over next few days.

## 2014-12-19 NOTE — Assessment & Plan Note (Signed)
Continue PPI ?

## 2014-12-19 NOTE — Assessment & Plan Note (Signed)
Reviewed recent fructosamine. Insulin has recently been increased and pt has f/u already scheduled with endocrinology

## 2014-12-19 NOTE — Assessment & Plan Note (Signed)
Continue current regimen. euvolemic to somewhat dry today.

## 2014-12-19 NOTE — Assessment & Plan Note (Signed)
Continue xanax, pristiq, wellbutrin, and seroquel. Sees psychiatry.

## 2014-12-19 NOTE — Assessment & Plan Note (Signed)
Dx by endoscopy, followed by GI.

## 2014-12-21 ENCOUNTER — Other Ambulatory Visit: Payer: Self-pay | Admitting: *Deleted

## 2014-12-22 ENCOUNTER — Other Ambulatory Visit: Payer: Self-pay | Admitting: *Deleted

## 2014-12-22 ENCOUNTER — Telehealth: Payer: Self-pay | Admitting: Endocrinology

## 2014-12-22 ENCOUNTER — Encounter: Payer: Self-pay | Admitting: *Deleted

## 2014-12-22 NOTE — Telephone Encounter (Signed)
please call patient: What is current lantus dosage?

## 2014-12-22 NOTE — Patient Outreach (Signed)
Follow up phone call (this RN CM covering for Deloria Lair RN CM NP):   Pt states she f/u with Dr. Loanne Drilling.  Pt states MD, medication change- Lantus 100 units in am, 40 units at bedtime and to continue to take sliding scale insulin 5-30 units at 4 pm.   Pt states her sugars are 200- 350's, ranges 95-444.  Pt states her sugar this morning was 362 and she is due to take her sugar again.  RN CM requested pt check blood sugar while on the phone to which pt did, result was 463.  Pt states she will be taking 30 units (sliding scale insulin, Humalog).  RN CM discussed with pt reviewed her visit with Dr. Loanne Drilling in [place], that MD requested she check her sugars bid and to call MD if sugars <70 or if have a lot of >200 readings.  Pt states she has been checking sugars twice a day but has not been calling MD.  Requested pt to call MD now, report high reading today as well as at other times and this RN CM will call her back in an hour  To which pt said she would.    Plan to f/u with pt again in an hour.     Zara Chess.   [facility]  [phone]

## 2014-12-22 NOTE — Telephone Encounter (Signed)
Patient called to report blood sugar readings.  12/19/2014: Morning: 255  Afternoon: 322 Evening: 344  12/20/2014: Morning: 351 Afternoon: 293 Evening: 417   12/21/2014: Morning: 206  Afternoon: 182 Evening: 463  Before Bed: 362  12/22/2014: Morning: 366  Pt sated that she is taking 100 units of lantus in the morning and 5-30 units of humalog in the evening depending on what her blood sugar is reading (5 units for every 50 over 150)  Please advise, Thanks!

## 2014-12-22 NOTE — Telephone Encounter (Signed)
I contacted pt and she verified she taking 100 units of Lantus the morning and 100 units in the evening.

## 2014-12-22 NOTE — Patient Outreach (Signed)
[facility]) Care Management  [place]  12/22/2014   ROYA GIESELMAN 02/03/1941 [id number]    Documentation-    Addendum-  Follow up call entered on 4/28 in Epic was made on 4/27 at 4:51pm.       Zara Chess.   [facility] [place] [facility]  [phone]

## 2014-12-22 NOTE — Telephone Encounter (Signed)
Ok, please increase to 230 units qam.  No need to take any in the evening. Please call next week to report progress

## 2014-12-22 NOTE — Patient Outreach (Signed)
Follow up phone call (this RN CM covering for Deloria Lair RN CM NP).  This RN CM spoke with pt earlier, blood sugar high, instructed pt to call Dr. Loanne Drilling, f/u with pt now to see outcome.   Pt states she did not get in touch with Dr. Loanne Drilling, could not get it to work right (message for MD office).  Pt states she did take her insulin (30 units of  Humalog).  As discussed, almost time for MD office closing, pt to call MD office tomorrow morning.      Pt to continue to check sugars bid, take insulin as ordered. Pt to call MD office tomorrow morning, report high blood sugar readings. Pt to call RN CM tomorrow, report outcome of call to MD.     [name].   [place]  [phone]

## 2014-12-22 NOTE — Patient Outreach (Signed)
Follow up phone call:  Received an email  this morning from [facility] administrative assistant that  pt called, wanted RN CM to call her, had information needed to give.   Called pt and she said she did not call today.  RN CM did speak with pt yesterday.  Pt states she was just about to call Dr. Cordelia Pen office (report high sugars), did not check sugar this am yet.   As discussed, pt to check sugar, then call MD.   This RN CM informed pt her  primary RN CM Deloria Lair has  returned, to f/u with her.       Zara Chess.   [place] [facility]  [phone]

## 2014-12-22 NOTE — Telephone Encounter (Signed)
Requested call back from patient to discuss.  

## 2014-12-22 NOTE — Telephone Encounter (Signed)
Patient need take to Dr Loanne Drilling about changing her insulin around

## 2014-12-23 ENCOUNTER — Encounter (HOSPITAL_COMMUNITY)
Admission: RE | Admit: 2014-12-23 | Discharge: 2014-12-23 | Disposition: A | Discharge status: Home / Self Care | Payer: Medicare Other | Source: Ambulatory Visit | Attending: Family Medicine | Admitting: Family Medicine

## 2014-12-23 DIAGNOSIS — S42019A Nondisplaced fracture of sternal end of unspecified clavicle, initial encounter for closed fracture: Secondary | ICD-10-CM | POA: Diagnosis not present

## 2014-12-23 DIAGNOSIS — Q2733 Arteriovenous malformation of digestive system vessel: Secondary | ICD-10-CM | POA: Diagnosis not present

## 2014-12-23 DIAGNOSIS — K7460 Unspecified cirrhosis of liver: Secondary | ICD-10-CM | POA: Diagnosis not present

## 2014-12-23 DIAGNOSIS — J449 Chronic obstructive pulmonary disease, unspecified: Secondary | ICD-10-CM | POA: Diagnosis not present

## 2014-12-23 DIAGNOSIS — N289 Disorder of kidney and ureter, unspecified: Secondary | ICD-10-CM | POA: Diagnosis not present

## 2014-12-23 DIAGNOSIS — D649 Anemia, unspecified: Secondary | ICD-10-CM | POA: Diagnosis not present

## 2014-12-23 MED ORDER — DARBEPOETIN ALFA 200 MCG/0.4ML IJ SOSY
200.0000 ug | PREFILLED_SYRINGE | INTRAMUSCULAR | Status: DC
  Administered 2014-12-23: 200 ug via SUBCUTANEOUS

## 2014-12-23 MED ORDER — DARBEPOETIN ALFA 200 MCG/0.4ML IJ SOSY
PREFILLED_SYRINGE | INTRAMUSCULAR | Status: DC
Start: 2014-12-23 — End: 2014-12-24
  Filled 2014-12-23: qty 0.4

## 2014-12-23 NOTE — Telephone Encounter (Signed)
Patient advised of new instructions below and voiced understanding.

## 2014-12-26 ENCOUNTER — Encounter: Payer: Self-pay | Admitting: *Deleted

## 2014-12-26 LAB — POCT HEMOGLOBIN-HEMACUE: Hemoglobin: 8.7 g/dL — ABNORMAL LOW (ref 12.0–15.0)

## 2014-12-27 ENCOUNTER — Telehealth: Payer: Self-pay | Admitting: Endocrinology

## 2014-12-27 NOTE — Telephone Encounter (Signed)
Pt called returning megans call, megan when you get a chance can you call her back please

## 2014-12-28 ENCOUNTER — Telehealth: Payer: Self-pay | Admitting: Pulmonary Disease

## 2014-12-28 NOTE — Telephone Encounter (Signed)
Spoke with pt, aware sample placed up front.  Nothing further needed.

## 2014-12-28 NOTE — Telephone Encounter (Signed)
I contacted the patient. She stated to me and this time she did not need anything. Patient stated she would call our office back if she needed anything.

## 2014-12-29 ENCOUNTER — Encounter: Payer: Self-pay | Admitting: *Deleted

## 2015-01-01 ENCOUNTER — Encounter: Payer: Self-pay | Admitting: Family Medicine

## 2015-01-02 ENCOUNTER — Telehealth: Payer: Self-pay | Admitting: Family Medicine

## 2015-01-02 ENCOUNTER — Ambulatory Visit (INDEPENDENT_AMBULATORY_CARE_PROVIDER_SITE_OTHER): Payer: Medicare Other | Admitting: Primary Care

## 2015-01-02 ENCOUNTER — Encounter: Payer: Self-pay | Admitting: Primary Care

## 2015-01-02 ENCOUNTER — Telehealth: Payer: Self-pay | Admitting: *Deleted

## 2015-01-02 VITALS — BP 116/64 | HR 55 | Temp 98.2°F | Ht 62.5 in | Wt 222.4 lb

## 2015-01-02 DIAGNOSIS — R300 Dysuria: Secondary | ICD-10-CM | POA: Diagnosis not present

## 2015-01-02 DIAGNOSIS — E118 Type 2 diabetes mellitus with unspecified complications: Secondary | ICD-10-CM | POA: Diagnosis not present

## 2015-01-02 DIAGNOSIS — F4323 Adjustment disorder with mixed anxiety and depressed mood: Secondary | ICD-10-CM | POA: Diagnosis not present

## 2015-01-02 DIAGNOSIS — J449 Chronic obstructive pulmonary disease, unspecified: Secondary | ICD-10-CM | POA: Diagnosis not present

## 2015-01-02 LAB — POCT URINALYSIS DIPSTICK
Bilirubin, UA: NEGATIVE
Blood, UA: NEGATIVE
Glucose, UA: NEGATIVE
Ketones, UA: NEGATIVE
NITRITE UA: NEGATIVE
PH UA: 6
PROTEIN UA: NEGATIVE
Spec Grav, UA: 1.015
UROBILINOGEN UA: 4

## 2015-01-02 MED ORDER — INSULIN GLARGINE 100 UNIT/ML SOLOSTAR PEN: 230.0000 [IU] | PEN_INJECTOR | SUBCUTANEOUS | Status: DC

## 2015-01-02 MED ORDER — CIPROFLOXACIN HCL 500 MG PO TABS: 500.0000 mg | ORAL_TABLET | Freq: Two times a day (BID) | ORAL | Status: DC

## 2015-01-02 MED ORDER — CIPROFLOXACIN HCL 250 MG PO TABS: 250.0000 mg | ORAL_TABLET | Freq: Two times a day (BID) | ORAL | Status: DC

## 2015-01-02 MED ORDER — ALPRAZOLAM 1 MG PO TABS: ORAL_TABLET | ORAL | Status: DC

## 2015-01-02 NOTE — Telephone Encounter (Signed)
[facility] advised that this was correct dosing.

## 2015-01-02 NOTE — Telephone Encounter (Signed)
Appt scheduled with Kate-Dr. Darnell Level is full.

## 2015-01-02 NOTE — Progress Notes (Signed)
Subjective:    Patient ID: [name], female    DOB: [birth year], 74 y.o.   MRN: [id number]  HPI  [name] is a 74 year old female with a chief complaint of dysuria. She reports recurrent urinary tract infections intermittently since July 2015. The dysuria began 2 weeks ago and she also reports an overall decrease in her urinary stream, fevers, and nausea. She has not taken anything for her symptoms. She is also requesting a refill of her Xanax and Lantus.   Review of Systems  Constitutional: Positive for fever.  Respiratory: Positive for shortness of breath.   Cardiovascular: Negative for chest pain.  Gastrointestinal: Positive for nausea. Negative for vomiting and abdominal pain.  Genitourinary: Positive for dysuria and flank pain. Negative for urgency and frequency.  Neurological: Positive for weakness.       Past Medical History  Diagnosis Date  . Chronic airway obstruction, not elsewhere classified   . Unspecified chronic bronchitis   . Unspecified essential hypertension   . Non-obstructive CAD   . Palpitations   . Mild aortic stenosis     a. 03/2014 Valve area (VTI): 2.89 cm^2, Valve area (Vmax): 2.7 cm^2.  . Pure hypercholesterolemia   . Morbid obesity   . Esophageal reflux   . GAVE (gastric antral vascular ectasia)     angiodysplasia  . Diverticulosis of colon (without mention of hemorrhage)   . Benign neoplasm of colon   . Autoimmune hepatitis   . Osteoarthrosis, unspecified whether generalized or localized, unspecified site   . Osteoporosis, unspecified   . Restless legs syndrome (RLS)   . Major depressive disorder, recurrent episode, severe, specified as with psychotic behavior   . Iron deficiency anemia secondary to blood loss (chronic)     a. frequent PRBC transfusions.  . Coarse tremors     a. arms.  . Carpal tunnel syndrome on both sides   . Palpitations   . Chronic diastolic CHF (congestive heart failure)     a. 03/26/2014 Echo: EF 55-60%, no rwma,  mild AS/AI, mod-sev Ca2+ MV annulus, mildly to mod dil LA.  [name] Asthma   . OSA (obstructive sleep apnea)     a. does not use CPAP.  [name] Type II diabetes mellitus   . H/O hiatal hernia   . 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 dx'd 1990's  . Adenomatous colon polyp   . Midsternal chest pain     a. conservatively managed ->poor candidate for cath/anticoagulation.  [name] Anxiety   . Portal hypertensive gastropathy   . Falls frequently     completed HHPT/OT 06/2014, PT unmet goals  . Recurrent UTI (urinary tract infection)     h/o hospitalization with urosepsis 2015, but thought large component colonization/bacteriuria, only treat if symptomatic ([place])  . Hiatal hernia   . Nonalcoholic steatohepatitis (NASH)   . Thrombocytopenia   . Protein calorie malnutrition   . Family history of adverse reaction to anesthesia     Mother and sister were hard to wake up  . History of pneumonia     CAP 2015    History   Social History  . Marital Status: Married    Spouse Name: jerry Mahajan  . Number of Children: 4  . Years of Education: 12   Occupational History  . Disabled   .     Social History Main Topics  . Smoking status: Former Smoker -- 1.50 packs/day for 25 years  Types: Cigarettes    Quit date: 08/26/1992  . Smokeless tobacco: Never Used  . Alcohol Use: No     Comment: 0/25/42 "last alcoholic drink was years ago"  . Drug Use: No  . Sexual Activity: No   Other Topics Concern  . Not on file   Social History Narrative   Patient lives at home with her husband [name]).    Retired.   Caffeine- None    Right handed.      Dr. Olevia Perches GI    Dr. Burt Knack cards    Dr. Loanne Drilling endo   Psych: Followed by [facility] (ph [phone], fax [phone]).     Past Surgical History  Procedure Laterality Date  . Hemorroidectomy    . Appendectomy    . Vaginal hysterectomy    . Breast biopsy      bilateral  . Cesarean section      x 3  .  Appendectomy    . Esophagogastroduodenoscopy  06/12/2012    Procedure: ESOPHAGOGASTRODUODENOSCOPY (EGD);  Surgeon: Lafayette Dragon, MD;  Location: [facility];  Service: Endoscopy;  Laterality: N/A;  . Balloon dilation  06/12/2012    Procedure: BALLOON DILATION;  Surgeon: Lafayette Dragon, MD;  Location: [facility];  Service: Endoscopy;  Laterality: N/A;  ?balloon  . Esophagogastroduodenoscopy  09/10/2012    Procedure: ESOPHAGOGASTRODUODENOSCOPY (EGD);  Surgeon: Lafayette Dragon, MD;  Location: [facility];  Service: Endoscopy;  Laterality: N/A;  . Hot hemostasis  09/10/2012    Procedure: HOT HEMOSTASIS (ARGON PLASMA COAGULATION/BICAP);  Surgeon: Lafayette Dragon, MD;  Location: [facility];  Service: Endoscopy;  Laterality: N/A;  . Esophagogastroduodenoscopy N/A 01/18/2013    Procedure: ESOPHAGOGASTRODUODENOSCOPY (EGD);  Surgeon: Lafayette Dragon, MD;  Location: [facility];  Service: Endoscopy;  Laterality: N/A;  . Esophagogastroduodenoscopy N/A 05/24/2013    Procedure: ESOPHAGOGASTRODUODENOSCOPY (EGD);  Surgeon: Jerene Bears, MD;  Location: [place];  Service: Gastroenterology;  Laterality: N/A;  . Hot hemostasis N/A 05/24/2013    Procedure: HOT HEMOSTASIS (ARGON PLASMA COAGULATION/BICAP);  Surgeon: Jerene Bears, MD;  Location: [place];  Service: Gastroenterology;  Laterality: N/A;  . US echocardiography  07/2014    mild LVH, EF 60-65%, normal wall motion, mild AR, mod dilated LA, mildly dilated RA, peak PA pressure 26mmHg  . Esophagogastroduodenoscopy N/A 10/20/2014    Procedure: ESOPHAGOGASTRODUODENOSCOPY (EGD);  Surgeon: Lafayette Dragon, MD;  Location: [facility];  Service: Endoscopy;  Laterality: N/A;  . Hot hemostasis N/A 10/20/2014    Procedure: HOT HEMOSTASIS (ARGON PLASMA COAGULATION/BICAP);  Surgeon: Lafayette Dragon, MD;  Location: [facility];  Service: Endoscopy;  Laterality: N/A;    Family History  Problem Relation Age of Onset  . Heart disease Mother   . Cervical cancer Mother   .  Kidney disease Mother   . Diabetes Mother   . Breast cancer Sister   . Multiple sclerosis Sister   . Colon cancer Neg Hx   . Breast cancer Maternal Aunt     x 2  . Diabetes Father   . Diabetes Sister   . Multiple sclerosis Other   . Heart attack Mother   . Heart attack Father   . Stroke Daughter   . Stroke Daughter     Allergies  Allergen Reactions  . Acetaminophen Other (See Comments)    Liver dysfunction  . Aspirin Other (See Comments)    Causes nosebleeds  . Theophylline Nausea And Vomiting  . Penicillins Itching    Current Outpatient Prescriptions on  File Prior to Visit  Medication Sig Dispense Refill  . albuterol (PROAIR HFA) 108 (90 BASE) MCG/ACT inhaler Inhale 2 puffs into the lungs every 6 (six) hours as needed for wheezing or shortness of breath.     [name] b complex vitamins tablet Take 1 tablet by mouth daily.     [name] buPROPion (WELLBUTRIN XL) 150 MG 24 hr tablet Take 1 tablet (150 mg total) by mouth daily. 90 tablet 3  . darbepoetin (ARANESP) 200 MCG/0.4ML SOLN injection Inject 200 mcg into the skin every 14 (fourteen) days.    . diphenhydrAMINE-zinc acetate (BENADRYL) cream Apply topically 3 (three) times daily as needed for itching. 28.4 g 0  . Fluticasone-Salmeterol (ADVAIR) 250-50 MCG/DOSE AEPB Inhale 1 puff into the lungs 2 (two) times daily. 2 each 0  . furosemide (LASIX) 40 MG tablet Take 80 mg by mouth 2 (two) times daily.     [name] glucose blood test strip 1 each by Other route as directed. Use to check sugar 4 times daily and as needed Dx: E10.40 **ONE TOUCH ULTRA** 200 each 3  . guaiFENesin (MUCINEX) 600 MG 12 hr tablet Take 600 mg by mouth 2 (two) times daily. 2 tablet by mouth once daily    . insulin lispro (HUMALOG) 100 UNIT/ML KiwkPen INJECT 5-30 UNITS INTO THE SKIN AT 4PM PER SLIDING SCALE (5 for every 50 over 150)    . Insulin Pen Needle (B-D ULTRAFINE III SHORT PEN) 31G X 8 MM MISC Use to inject insulin 2 times daily as instructed. 200 each 2  . lactulose,  encephalopathy, (CHRONULAC) 10 GM/15ML SOLN Take 30 mLs (20 g total) by mouth 2 (two) times daily. constipation 2700 mL 6  . levalbuterol (XOPENEX) 0.63 MG/3ML nebulizer solution Take 0.63 mg by nebulization every 8 (eight) hours as needed for shortness of breath.   5  . lisinopril (PRINIVIL,ZESTRIL) 5 MG tablet Take 5 mg by mouth every evening.     . loratadine (CLARITIN) 10 MG tablet TAKE 1 TABLET EVERY DAY 30 tablet 3  . Multiple Vitamins-Minerals (CENTRUM SILVER PO) Take 1 tablet by mouth daily.     . mupirocin cream (BACTROBAN) 2 % APPLY 1 APPLICATION TOPICALLY 2 (TWO) TIMES DAILY. FOR SKIN LESIONS 30 g 0  . nadolol (CORGARD) 20 MG tablet TAKE 1/2 TABLET BY MOUTH DAILY 15 tablet 1  . nitroGLYCERIN (NITROSTAT) 0.4 MG SL tablet Place 1 tablet (0.4 mg total) under the tongue every 5 (five) minutes as needed for chest pain. 25 tablet 3  . NON FORMULARY 3 liters oxygen 24/7    . nystatin (MYCOSTATIN) 100000 UNIT/ML suspension Take 5 mLs (500,000 Units total) by mouth 4 (four) times daily. 120 mL 0  . ONE TOUCH LANCETS MISC Use to check sugar 4 times daily and as needed. NF:A21.30 **ONE TOUCH DELICA** 865 each 2  . oxyCODONE (OXY IR/ROXICODONE) 5 MG immediate release tablet Take one tablet by mouth every 4 hours as needed for pain 60 tablet 0  . pantoprazole (PROTONIX) 40 MG tablet Take 1 tablet (40 mg total) by mouth 2 (two) times daily. 60 tablet 6  . polyethylene glycol (MIRALAX / GLYCOLAX) packet Take 17 g by mouth daily as needed for moderate constipation.    [name] PRISTIQ 50 MG 24 hr tablet TAKE 3 TABLETS BY MOUTH EVERY DAY 90 tablet 1  . promethazine (PHENERGAN) 25 MG tablet Take 1 tablet (25 mg total) by mouth 2 (two) times daily as needed for nausea. 30 tablet 3  .  QUEtiapine (SEROQUEL) 200 MG tablet Take 1 tablet (200 mg total) by mouth at bedtime. 30 tablet 6  . rOPINIRole (REQUIP) 4 MG tablet TAKE 1 TABLET BY MOUTH AT BEDTIME 30 tablet 2  . Saline (OCEAN NASAL SPRAY NA) Place 1 spray into  the nose 3 (three) times daily as needed (congestion).     . simvastatin (ZOCOR) 40 MG tablet TAKE 1 TABLET BY MOUTH EVERY EVENING 30 tablet 3  . spironolactone (ALDACTONE) 25 MG tablet TAKE 1/2 TABLET BY MOUTH DAILY 15 tablet 1  . tamsulosin (FLOMAX) 0.4 MG CAPS capsule Take 0.4 mg by mouth daily.    . Vitamin D, Ergocalciferol, (DRISDOL) 50000 UNITS CAPS capsule TAKE 1 CAPSULE (50,000 UNITS TOTAL) BY MOUTH EVERY 7 (SEVEN) DAYS. TAKE ON FRIDAY 5 capsule 3  . XIFAXAN 550 MG TABS tablet TAKE 1 TABLET (550 MG TOTAL) BY MOUTH DAILY. 30 tablet 1   No current facility-administered medications on file prior to visit.    BP 116/64 mmHg  Pulse 55  Temp(Src) 98.2 F (36.8 C) (Oral)  Ht 5' 2.5" (1.588 m)  Wt 222 lb 6.4 oz (100.88 kg)  BMI 40.00 kg/m2  SpO2 99%    Objective:   Physical Exam  Constitutional: She is oriented to person, place, and time. She appears well-developed.  Neck: Neck supple.  Cardiovascular: Normal rate and regular rhythm.   Pulmonary/Chest: Effort normal and breath sounds normal.  Abdominal: Soft. Bowel sounds are normal. There is no tenderness. There is no CVA tenderness.  Lymphadenopathy:    She has no cervical adenopathy.  Neurological: She is alert and oriented to person, place, and time.  Skin: Skin is warm and dry.          Assessment & Plan:  Urinary Tract Infection:  Positive for leuks, negative for nitrites and blood. Due to symptoms, age, and co-morbidities, will treat for probably infection. RX for Cipro 250 mg BID for 3 days. She declines pyridium. Culture sent. She is to follow up if no improvement in 3-4 days.

## 2015-01-02 NOTE — Progress Notes (Signed)
Pre visit review using our clinic review tool, if applicable. No additional management support is needed unless otherwise documented below in the visit note. 

## 2015-01-02 NOTE — Telephone Encounter (Signed)
Pt is having trouble back pain. She is also nauseated, and is having pain while peeing.  Blood work showed she was 2-4 renal problems.  Pt wants to know if she should be seen here or if she should go to urologist ( Dr Risa Grill)  Phone back number is [phone].

## 2015-01-02 NOTE — Telephone Encounter (Addendum)
[facility] left v/m received refill lantus insulin and [facility] request cb to verify pt taking 230 units of lantus.

## 2015-01-02 NOTE — Patient Instructions (Addendum)
Start Ciprofloxacin antibiotic for urinary tract infection. Take 1 tablet by mouth twice daily for 3 days. Call me if no improvement of symptoms when you've completed the antibiotic. Drink a decent amount of water to stay hydrated. It was nice meeting you.  Urinary Tract Infection Urinary tract infections (UTIs) can develop anywhere along your urinary tract. Your urinary tract is your body's drainage system for removing wastes and extra water. Your urinary tract includes two kidneys, two ureters, a bladder, and a urethra. Your kidneys are a pair of bean-shaped organs. Each kidney is about the size of your fist. They are located below your ribs, one on each side of your spine. CAUSES Infections are caused by microbes, which are microscopic organisms, including fungi, viruses, and bacteria. These organisms are so small that they can only be seen through a microscope. Bacteria are the microbes that most commonly cause UTIs. SYMPTOMS  Symptoms of UTIs may vary by age and gender of the patient and by the location of the infection. Symptoms in young women typically include a frequent and intense urge to urinate and a painful, burning feeling in the bladder or urethra during urination. Older women and men are more likely to be tired, shaky, and weak and have muscle aches and abdominal pain. A fever may mean the infection is in your kidneys. Other symptoms of a kidney infection include pain in your back or sides below the ribs, nausea, and vomiting. DIAGNOSIS To diagnose a UTI, your caregiver will ask you about your symptoms. Your caregiver also will ask to provide a urine sample. The urine sample will be tested for bacteria and white blood cells. White blood cells are made by your body to help fight infection. TREATMENT  Typically, UTIs can be treated with medication. Because most UTIs are caused by a bacterial infection, they usually can be treated with the use of antibiotics. The choice of antibiotic and  length of treatment depend on your symptoms and the type of bacteria causing your infection. HOME CARE INSTRUCTIONS  If you were prescribed antibiotics, take them exactly as your caregiver instructs you. Finish the medication even if you feel better after you have only taken some of the medication.  Drink enough water and fluids to keep your urine clear or pale yellow.  Avoid caffeine, tea, and carbonated beverages. They tend to irritate your bladder.  Empty your bladder often. Avoid holding urine for long periods of time.  Empty your bladder before and after sexual intercourse.  After a bowel movement, women should cleanse from front to back. Use each tissue only once. SEEK MEDICAL CARE IF:   You have back pain.  You develop a fever.  Your symptoms do not begin to resolve within 3 days. SEEK IMMEDIATE MEDICAL CARE IF:   You have severe back pain or lower abdominal pain.  You develop chills.  You have nausea or vomiting.  You have continued burning or discomfort with urination. MAKE SURE YOU:   Understand these instructions.  Will watch your condition.  Will get help right away if you are not doing well or get worse. Document Released: 05/22/2005 Document Revised: 02/11/2012 Document Reviewed: 09/20/2011 [facility] Patient Information 2015 [place], [place]. This information is not intended to replace advice given to you by your health care provider. Make sure you discuss any questions you have with your health care provider.

## 2015-01-03 ENCOUNTER — Ambulatory Visit: Payer: Medicare Other | Admitting: Physician Assistant

## 2015-01-03 ENCOUNTER — Other Ambulatory Visit: Payer: Self-pay | Admitting: Family Medicine

## 2015-01-03 ENCOUNTER — Other Ambulatory Visit: Payer: Self-pay | Admitting: Endocrinology

## 2015-01-03 MED ORDER — INSULIN GLARGINE 100 UNIT/ML SOLOSTAR PEN: 140.0000 [IU] | PEN_INJECTOR | SUBCUTANEOUS | Status: DC

## 2015-01-03 NOTE — Telephone Encounter (Signed)
please call patient: i have sent a prescription to your pharmacy, for 140 units qam.  There's no need to split it up.

## 2015-01-03 NOTE — Telephone Encounter (Signed)
Please see below. Pt is requesting a refill on her Lantus. Medication list stated the patient is injecting 230 units. Last fructosamine result states to inject 100 units. Please advise, Thanks!

## 2015-01-03 NOTE — Telephone Encounter (Signed)
Patient need a refill of Lantus. °

## 2015-01-03 NOTE — Telephone Encounter (Signed)
please call patient: We need to verify lantus dosage.  Please let us know how many units you are taking.

## 2015-01-03 NOTE — Telephone Encounter (Signed)
Patient advised of note below and voiced understanding.  

## 2015-01-03 NOTE — Telephone Encounter (Signed)
I contacted the patient she stated she is taking 100 units of Lantus in the morning and 40 units in the evening.

## 2015-01-03 NOTE — Telephone Encounter (Signed)
This was refilled yesterday.

## 2015-01-04 ENCOUNTER — Other Ambulatory Visit: Payer: Self-pay | Admitting: Family Medicine

## 2015-01-04 LAB — URINE CULTURE: Colony Count: >=100000

## 2015-01-04 NOTE — Telephone Encounter (Signed)
error 

## 2015-01-05 ENCOUNTER — Other Ambulatory Visit: Payer: Self-pay | Admitting: *Deleted

## 2015-01-05 ENCOUNTER — Telehealth: Payer: Self-pay | Admitting: Primary Care

## 2015-01-05 DIAGNOSIS — N390 Urinary tract infection, site not specified: Secondary | ICD-10-CM

## 2015-01-05 DIAGNOSIS — IMO0002 Reserved for concepts with insufficient information to code with codable children: Secondary | ICD-10-CM

## 2015-01-05 DIAGNOSIS — E1165 Type 2 diabetes mellitus with hyperglycemia: Secondary | ICD-10-CM

## 2015-01-05 MED ORDER — CEPHALEXIN 250 MG PO CAPS: 250.0000 mg | ORAL_CAPSULE | Freq: Two times a day (BID) | ORAL | Status: DC

## 2015-01-05 NOTE — Patient Outreach (Signed)
[name]) Care Management  [place]  01/05/2015   [name] Dec 01, 1940 [id number]  Subjective: Pt complains of have oral yeast infection. The nystatin mouth wash has not helped. Pt is taking ciprofloxin for UTI. Her Lantus was increased to 140 units in am and she is on a 4 pm sliding scale. Her glucose levels are still elevated and she still chooses to eat things like ice cream. It is then that her glucose goes over 400mg /Dl. 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 She had an eye exam in February and no new findings were discovered. Pt also reports she had an ED visit while I was on LOA for epitaxis, was treated and released.  Objective: See Vital Signs for this date.  Current Medications:  Current Outpatient Prescriptions  Medication Sig Dispense Refill  . albuterol (PROAIR HFA) 108 (90 BASE) MCG/ACT inhaler Inhale 2 puffs into the lungs every 6 (six) hours as needed for wheezing or shortness of breath.     . ALPRAZolam (XANAX) 1 MG tablet TAKE 1 TABLET EVERY 8 HOURS AS NEEDED FOR ANXIETY 30 tablet 0  . b complex vitamins tablet Take 1 tablet by mouth daily.     . darbepoetin (ARANESP) 200 MCG/0.4ML SOLN injection Inject 200 mcg into the skin every 14 (fourteen) days.    . diphenhydrAMINE-zinc acetate (BENADRYL) cream Apply topically 3 (three) times daily as needed for itching. 28.4 g 0  . Fluticasone-Salmeterol (ADVAIR) 250-50 MCG/DOSE AEPB Inhale 1 puff into the lungs 2 (two) times daily. 2 each 0  . furosemide (LASIX) 40 MG tablet Take 80 mg by mouth 2 (two) times daily.     [name] glucose blood test strip 1 each by Other route as directed. Use to check sugar 4 times daily and as needed Dx: E10.40 **ONE TOUCH ULTRA** 200 each 3  . guaiFENesin (MUCINEX) 600 MG 12 hr tablet Take 600 mg by mouth 2 (two) times daily. 2 tablet by mouth once daily    . Insulin Glargine (LANTUS SOLOSTAR) 100 UNIT/ML Solostar Pen Inject 140 Units into the skin every morning.  20 pen 11  . insulin lispro (HUMALOG) 100 UNIT/ML KiwkPen INJECT 5-30 UNITS INTO THE SKIN AT 4PM PER SLIDING SCALE (5 for every 50 over 150)    . Insulin Pen Needle (B-D ULTRAFINE III SHORT PEN) 31G X 8 MM MISC Use to inject insulin 2 times daily as instructed. 200 each 2  . lactulose, encephalopathy, (CHRONULAC) 10 GM/15ML SOLN Take 30 mLs (20 g total) by mouth 2 (two) times daily. constipation 2700 mL 6  . levalbuterol (XOPENEX) 0.63 MG/3ML nebulizer solution Take 0.63 mg by nebulization every 8 (eight) hours as needed for shortness of breath.   5  . lisinopril (PRINIVIL,ZESTRIL) 5 MG tablet Take 5 mg by mouth every evening.     . loratadine (CLARITIN) 10 MG tablet TAKE 1 TABLET EVERY DAY 30 tablet 3  . Multiple Vitamins-Minerals (CENTRUM SILVER PO) Take 1 tablet by mouth daily.     . mupirocin cream (BACTROBAN) 2 % APPLY 1 APPLICATION TOPICALLY 2 (TWO) TIMES DAILY. FOR SKIN LESIONS 30 g 0  . nadolol (CORGARD) 20 MG tablet TAKE 1/2 TABLET BY MOUTH DAILY 15 tablet 1  . NON FORMULARY 3 liters oxygen 24/7    . nystatin (MYCOSTATIN) 100000 UNIT/ML suspension Take 5 mLs (500,000 Units total) by mouth 4 (four) times daily. 120 mL 0  . ONE TOUCH LANCETS MISC Use to check sugar 4 times  daily and as needed. ZD:G38.75 **ONE TOUCH DELICA** 643 each 2  . oxyCODONE (OXY IR/ROXICODONE) 5 MG immediate release tablet Take one tablet by mouth every 4 hours as needed for pain 60 tablet 0  . pantoprazole (PROTONIX) 40 MG tablet Take 1 tablet (40 mg total) by mouth 2 (two) times daily. 60 tablet 6  . polyethylene glycol (MIRALAX / GLYCOLAX) packet Take 17 g by mouth daily as needed for moderate constipation.    [name] PRISTIQ 50 MG 24 hr tablet TAKE 3 TABLETS BY MOUTH EVERY DAY 90 tablet 1  . promethazine (PHENERGAN) 25 MG tablet Take 1 tablet (25 mg total) by mouth 2 (two) times daily as needed for nausea. 30 tablet 3  . QUEtiapine (SEROQUEL) 200 MG tablet Take 1 tablet (200 mg total) by mouth at bedtime. 30 tablet 6   . rOPINIRole (REQUIP) 4 MG tablet TAKE 1 TABLET BY MOUTH AT BEDTIME 30 tablet 2  . Saline (OCEAN NASAL SPRAY NA) Place 1 spray into the nose 3 (three) times daily as needed (congestion).     . simvastatin (ZOCOR) 40 MG tablet TAKE 1 TABLET BY MOUTH EVERY EVENING 30 tablet 3  . spironolactone (ALDACTONE) 25 MG tablet TAKE 1/2 TABLET BY MOUTH DAILY 15 tablet 1  . tamsulosin (FLOMAX) 0.4 MG CAPS capsule Take 0.4 mg by mouth daily.    . Vitamin D, Ergocalciferol, (DRISDOL) 50000 UNITS CAPS capsule TAKE 1 CAPSULE (50,000 UNITS TOTAL) BY MOUTH EVERY 7 (SEVEN) DAYS. TAKE ON FRIDAY 5 capsule 3  . XIFAXAN 550 MG TABS tablet TAKE 1 TABLET (550 MG TOTAL) BY MOUTH DAILY. 30 tablet 1  . buPROPion (WELLBUTRIN XL) 150 MG 24 hr tablet Take 1 tablet (150 mg total) by mouth daily. 90 tablet 3  . ciprofloxacin (CIPRO) 250 MG tablet Take 1 tablet (250 mg total) by mouth 2 (two) times daily. 6 tablet 0  . nitroGLYCERIN (NITROSTAT) 0.4 MG SL tablet Place 1 tablet (0.4 mg total) under the tongue every 5 (five) minutes as needed for chest pain. (Patient not taking: Reported on 01/05/2015) 25 tablet 3   No current facility-administered medications for this visit.    Functional Status:  In your present state of health, do you have any difficulty performing the following activities: 01/05/2015 12/09/2014  Hearing? Y N  Vision? Y N  Difficulty concentrating or making decisions? Y N  Walking or climbing stairs? Y Y  Dressing or bathing? Y N  Doing errands, shopping? Y -  Conservation officer, nature and eating ? N -  Using the Toilet? N -  In the past six months, have you accidently leaked urine? Y -  Do you have problems with loss of bowel control? N -  Managing your Medications? N -  Managing your Finances? Y -  Housekeeping or managing your Housekeeping? Y -    Fall/Depression Screening: PHQ 2/9 Scores 01/05/2015 12/19/2014 12/19/2014  PHQ - 2 Score 2 2 2   PHQ- 9 Score - - 10    Assessment:  Poor glucose control.               Oral candidiosis   Bruising  Plan: Ordered Diflucan 150 mg one tablet for 3 days.           Ordered Mupirocin 2% for skin lesions.           Reinforced diabetes compliance.

## 2015-01-05 NOTE — Telephone Encounter (Signed)
Notified Ms. Waddell that her urine culture returned and was resistant to Cipro. Will switch to Keflex and renally dose. She is to stop Cipro and pick up the new prescription; she has been on Kelfex in the past without a reaction. She verbalized understanding.

## 2015-01-06 ENCOUNTER — Telehealth: Payer: Self-pay | Admitting: Pulmonary Disease

## 2015-01-06 ENCOUNTER — Encounter (HOSPITAL_COMMUNITY)
Admission: RE | Admit: 2015-01-06 | Discharge: 2015-01-06 | Disposition: A | Discharge status: Home / Self Care | Payer: Medicare Other | Source: Ambulatory Visit | Attending: Pulmonary Disease | Admitting: Pulmonary Disease

## 2015-01-06 DIAGNOSIS — D649 Anemia, unspecified: Secondary | ICD-10-CM | POA: Insufficient documentation

## 2015-01-06 DIAGNOSIS — N289 Disorder of kidney and ureter, unspecified: Secondary | ICD-10-CM | POA: Diagnosis not present

## 2015-01-06 DIAGNOSIS — J449 Chronic obstructive pulmonary disease, unspecified: Secondary | ICD-10-CM | POA: Diagnosis not present

## 2015-01-06 DIAGNOSIS — Q2733 Arteriovenous malformation of digestive system vessel: Secondary | ICD-10-CM | POA: Diagnosis not present

## 2015-01-06 DIAGNOSIS — K7460 Unspecified cirrhosis of liver: Secondary | ICD-10-CM | POA: Diagnosis not present

## 2015-01-06 LAB — CBC WITH DIFFERENTIAL/PLATELET
BASOS PCT: 0 % (ref 0–1)
Basophils Absolute: 0 10*3/uL (ref 0.0–0.1)
Eosinophils Absolute: 0.1 10*3/uL (ref 0.0–0.7)
Eosinophils Relative: 5 % (ref 0–5)
HEMATOCRIT: 27.5 % — AB (ref 36.0–46.0)
HEMOGLOBIN: 8.8 g/dL — AB (ref 12.0–15.0)
LYMPHS ABS: 0.3 10*3/uL — AB (ref 0.7–4.0)
LYMPHS PCT: 12 % (ref 12–46)
MCH: 32.8 pg (ref 26.0–34.0)
MCHC: 32 g/dL (ref 30.0–36.0)
MCV: 102.6 fL — ABNORMAL HIGH (ref 78.0–100.0)
Monocytes Absolute: 0.3 10*3/uL (ref 0.1–1.0)
Monocytes Relative: 9 % (ref 3–12)
NEUTROS ABS: 2.1 10*3/uL (ref 1.7–7.7)
Neutrophils Relative %: 75 % (ref 43–77)
Platelets: 65 10*3/uL — ABNORMAL LOW (ref 150–400)
RBC: 2.68 MIL/uL — AB (ref 3.87–5.11)
RDW: 15 % (ref 11.5–15.5)
WBC: 2.9 10*3/uL — AB (ref 4.0–10.5)

## 2015-01-06 MED ORDER — DARBEPOETIN ALFA 200 MCG/0.4ML IJ SOSY
PREFILLED_SYRINGE | INTRAMUSCULAR | Status: AC
  Filled 2015-01-06: qty 0.4

## 2015-01-06 MED ORDER — FLUTICASONE-SALMETEROL 250-50 MCG/DOSE IN AEPB: 1.0000 | INHALATION_SPRAY | Freq: Two times a day (BID) | RESPIRATORY_TRACT | Status: AC

## 2015-01-06 MED ORDER — DARBEPOETIN ALFA 200 MCG/0.4ML IJ SOSY
200.0000 ug | PREFILLED_SYRINGE | INTRAMUSCULAR | Status: DC
  Administered 2015-01-06: 200 ug via SUBCUTANEOUS

## 2015-01-06 NOTE — Patient Outreach (Signed)
[facility]) Care Management  [place]  01/06/2015   [name] September 01, 1940 [id number]  Subjective: Pt complains of unresolved oral candidiosis, UTI, change in diabetes treatment plan, very slight improvement in glucose readings. She has a lot of upper extremity bruising.  Objective: Gums and tongue are very red. Multiple bruises and ecchymosis of upper extremities.  Current Medications:  Current Outpatient Prescriptions  Medication Sig Dispense Refill  . albuterol (PROAIR HFA) 108 (90 BASE) MCG/ACT inhaler Inhale 2 puffs into the lungs every 6 (six) hours as needed for wheezing or shortness of breath.     . ALPRAZolam (XANAX) 1 MG tablet TAKE 1 TABLET EVERY 8 HOURS AS NEEDED FOR ANXIETY 30 tablet 0  . b complex vitamins tablet Take 1 tablet by mouth daily.     . darbepoetin (ARANESP) 200 MCG/0.4ML SOLN injection Inject 200 mcg into the skin every 14 (fourteen) days.    . diphenhydrAMINE-zinc acetate (BENADRYL) cream Apply topically 3 (three) times daily as needed for itching. 28.4 g 0  . furosemide (LASIX) 40 MG tablet Take 80 mg by mouth 2 (two) times daily.     [name] glucose blood test strip 1 each by Other route as directed. Use to check sugar 4 times daily and as needed Dx: E10.40 **ONE TOUCH ULTRA** 200 each 3  . guaiFENesin (MUCINEX) 600 MG 12 hr tablet Take 600 mg by mouth 2 (two) times daily. 2 tablet by mouth once daily    . Insulin Glargine (LANTUS SOLOSTAR) 100 UNIT/ML Solostar Pen Inject 140 Units into the skin every morning. 20 pen 11  . insulin lispro (HUMALOG) 100 UNIT/ML KiwkPen INJECT 5-30 UNITS INTO THE SKIN AT 4PM PER SLIDING SCALE (5 for every 50 over 150)    . Insulin Pen Needle (B-D ULTRAFINE III SHORT PEN) 31G X 8 MM MISC Use to inject insulin 2 times daily as instructed. 200 each 2  . lactulose, encephalopathy, (CHRONULAC) 10 GM/15ML SOLN Take 30 mLs (20 g total) by mouth 2 (two) times daily. constipation 2700 mL 6  . levalbuterol  (XOPENEX) 0.63 MG/3ML nebulizer solution Take 0.63 mg by nebulization every 8 (eight) hours as needed for shortness of breath.   5  . lisinopril (PRINIVIL,ZESTRIL) 5 MG tablet Take 5 mg by mouth every evening.     . loratadine (CLARITIN) 10 MG tablet TAKE 1 TABLET EVERY DAY 30 tablet 3  . Multiple Vitamins-Minerals (CENTRUM SILVER PO) Take 1 tablet by mouth daily.     . mupirocin cream (BACTROBAN) 2 % APPLY 1 APPLICATION TOPICALLY 2 (TWO) TIMES DAILY. FOR SKIN LESIONS 30 g 0  . nadolol (CORGARD) 20 MG tablet TAKE 1/2 TABLET BY MOUTH DAILY 15 tablet 1  . NON FORMULARY 3 liters oxygen 24/7    . nystatin (MYCOSTATIN) 100000 UNIT/ML suspension Take 5 mLs (500,000 Units total) by mouth 4 (four) times daily. 120 mL 0  . ONE TOUCH LANCETS MISC Use to check sugar 4 times daily and as needed. EX:B28.41 **ONE TOUCH DELICA** 324 each 2  . oxyCODONE (OXY IR/ROXICODONE) 5 MG immediate release tablet Take one tablet by mouth every 4 hours as needed for pain 60 tablet 0  . pantoprazole (PROTONIX) 40 MG tablet Take 1 tablet (40 mg total) by mouth 2 (two) times daily. 60 tablet 6  . polyethylene glycol (MIRALAX / GLYCOLAX) packet Take 17 g by mouth daily as needed for moderate constipation.    [name] PRISTIQ 50 MG 24 hr tablet TAKE 3 TABLETS  BY MOUTH EVERY DAY 90 tablet 1  . promethazine (PHENERGAN) 25 MG tablet Take 1 tablet (25 mg total) by mouth 2 (two) times daily as needed for nausea. 30 tablet 3  . QUEtiapine (SEROQUEL) 200 MG tablet Take 1 tablet (200 mg total) by mouth at bedtime. 30 tablet 6  . rOPINIRole (REQUIP) 4 MG tablet TAKE 1 TABLET BY MOUTH AT BEDTIME 30 tablet 2  . Saline (OCEAN NASAL SPRAY NA) Place 1 spray into the nose 3 (three) times daily as needed (congestion).     . simvastatin (ZOCOR) 40 MG tablet TAKE 1 TABLET BY MOUTH EVERY EVENING 30 tablet 3  . spironolactone (ALDACTONE) 25 MG tablet TAKE 1/2 TABLET BY MOUTH DAILY 15 tablet 1  . tamsulosin (FLOMAX) 0.4 MG CAPS capsule Take 0.4 mg by mouth  daily.    . Vitamin D, Ergocalciferol, (DRISDOL) 50000 UNITS CAPS capsule TAKE 1 CAPSULE (50,000 UNITS TOTAL) BY MOUTH EVERY 7 (SEVEN) DAYS. TAKE ON FRIDAY 5 capsule 3  . XIFAXAN 550 MG TABS tablet TAKE 1 TABLET (550 MG TOTAL) BY MOUTH DAILY. 30 tablet 1  . buPROPion (WELLBUTRIN XL) 150 MG 24 hr tablet Take 1 tablet (150 mg total) by mouth daily. 90 tablet 3  . cephALEXin (KEFLEX) 250 MG capsule Take 1 capsule (250 mg total) by mouth 2 (two) times daily. 14 capsule 0  . Fluticasone-Salmeterol (ADVAIR DISKUS) 250-50 MCG/DOSE AEPB Inhale 1 puff into the lungs 2 (two) times daily. 2 each 0  . nitroGLYCERIN (NITROSTAT) 0.4 MG SL tablet Place 1 tablet (0.4 mg total) under the tongue every 5 (five) minutes as needed for chest pain. (Patient not taking: Reported on 01/05/2015) 25 tablet 3   No current facility-administered medications for this visit.   Facility-Administered Medications Ordered in Other Visits  Medication Dose Route Frequency Provider Last Rate Last Dose  . Darbepoetin Alfa (ARANESP) 200 MCG/0.4ML injection           . Darbepoetin Alfa (ARANESP) injection 200 mcg  200 mcg Subcutaneous Q14 Days Ria Bush, MD   200 mcg at 01/06/15 1439    Functional Status:  In your present state of health, do you have any difficulty performing the following activities: 01/06/2015 01/05/2015  Hearing? N Y  Vision? N Y  Difficulty concentrating or making decisions? N Y  Walking or climbing stairs? Y Y  Dressing or bathing? Y Y  Doing errands, shopping? - Y  Conservation officer, nature and eating ? - N  Using the Toilet? - N  In the past six months, have you accidently leaked urine? - Y  Do you have problems with loss of bowel control? - N  Managing your Medications? - N  Managing your Finances? - Y  Housekeeping or managing your Housekeeping? - Y    Fall/Depression Screening: PHQ 2/9 Scores 01/05/2015 12/19/2014 12/19/2014  PHQ - 2 Score 2 2 2   PHQ- 9 Score - - 10    Assessment: Uncontrolled diabetes  - pt unable or unwilling to adhere to diet.  Plan: I have given pt notice that we are coming to a point where I will be discharging her from care management. I have given her lots of information over the last year and 1/2 and she has made a lot of progress with managing herself. She does will with her medication compliance, she does her glucose monitoring, she follows her HF daily regimen. She continues to struggle with abstaining from high carb foods. She is aware of what to avoid but  cannot resist. I have reminded her of the problems that are associated with uncontrolled diabetes. I am going to call her in a month to see how she is doing and close her in home care management and transfer her to a telephonic program.  I gave Rx for Diflucan 150 mg one for 3 days, no refills.                         Mupirocin OIntment 2% apply to lesions bid as needed 30 Gms, 1 refill.                         CBC with diff to be drawn at outpt center if possible.  Deloria Lair [facility] [place] [phone]

## 2015-01-06 NOTE — Telephone Encounter (Signed)
Spouse Royann Shivers seen 5/12 for acute visit Requested samples of Adviar 250 on pt's behalf  2 samples provided and documented per protocol

## 2015-01-08 ENCOUNTER — Other Ambulatory Visit: Payer: Self-pay | Admitting: Internal Medicine

## 2015-01-09 ENCOUNTER — Other Ambulatory Visit: Payer: Self-pay | Admitting: *Deleted

## 2015-01-09 DIAGNOSIS — D508 Other iron deficiency anemias: Secondary | ICD-10-CM

## 2015-01-11 ENCOUNTER — Other Ambulatory Visit: Payer: Self-pay | Admitting: Internal Medicine

## 2015-01-12 ENCOUNTER — Other Ambulatory Visit: Payer: Self-pay | Admitting: *Deleted

## 2015-01-12 NOTE — Patient Outreach (Signed)
Pt called to report she does not feel well. She has a back ache and a cough. The mucous is yellow. She denies any fever. She reports she is taking her mucinex and nebulizer treatments.   I advised her to take a tylenol for pain or fever and not to take too many in regards to her liver disease. I advised her to drink plenty of fluids, continue to take her mucinex and nebs with regularity. She is to call me if her sxs worsen. (She has been on cephalexin for a UTI I might add). She said she will follow these directions and call me if necessary.

## 2015-01-13 ENCOUNTER — Other Ambulatory Visit: Payer: Self-pay | Admitting: Internal Medicine

## 2015-01-13 ENCOUNTER — Other Ambulatory Visit: Payer: Self-pay | Admitting: *Deleted

## 2015-01-13 NOTE — Patient Outreach (Signed)
Follow up call to pt to see how she is feeling before starting the weekend, after having complaints of cough yesterday. Today, she says she is a little better. She is taking her mucinex and nebs. She does not mention any pain today. I encouraged her to follow her daily medical regimen and I will call her next week to follow up.  Deloria Lair [facility] [place] [phone]

## 2015-01-16 ENCOUNTER — Ambulatory Visit (INDEPENDENT_AMBULATORY_CARE_PROVIDER_SITE_OTHER): Payer: Medicare Other | Admitting: Family Medicine

## 2015-01-16 ENCOUNTER — Encounter: Payer: Self-pay | Admitting: Family Medicine

## 2015-01-16 VITALS — BP 120/72 | HR 48 | Temp 98.5°F | Resp 20 | Wt 220.5 lb

## 2015-01-16 DIAGNOSIS — J209 Acute bronchitis, unspecified: Secondary | ICD-10-CM | POA: Diagnosis not present

## 2015-01-16 DIAGNOSIS — F4323 Adjustment disorder with mixed anxiety and depressed mood: Secondary | ICD-10-CM

## 2015-01-16 MED ORDER — ROPINIROLE HCL 4 MG PO TABS: 4.0000 mg | ORAL_TABLET | Freq: Every day | ORAL | Status: DC

## 2015-01-16 MED ORDER — ALPRAZOLAM 1 MG PO TABS: ORAL_TABLET | ORAL | Status: DC

## 2015-01-16 MED ORDER — AZITHROMYCIN 250 MG PO TABS: ORAL_TABLET | ORAL | Status: DC

## 2015-01-16 NOTE — Progress Notes (Signed)
BP 120/72 mmHg  Pulse 48  Temp(Src) 98.5 F (36.9 C) (Oral)  Resp 20  Wt 220 lb 8 oz (100.018 kg)  SpO2 98%   CC: "feels like pneumonia"  Subjective:    Patient ID: [name], female    DOB: 1941/08/26, 74 y.o.   MRN: [id number]  HPI: [name] is a 74 y.o. female presenting on 01/16/2015 for Cough; Chills; Nausea; and Nasal Congestion   Complicated pt with mult comorbidities presents today with 3d h/o dyspnea, chest tightness and chest congestion. Self-treated with albuterol nebulizer. Yesterday started coughing up green mucous. Endorses chills and mild fever. + head and chest congestion. + PNdrainage. + ST. + wheezing. Mildly lightheaded.   Trouble sleeping 2/2 cough. Treating with delsym. No ear or tooth pain or headaches.   Was recently on abx keflex and cipro for UTI.   Currently on xifaxan. H/o CAP 2015.  Relevant past medical, surgical, family and social history reviewed and updated as indicated. Interim medical history since our last visit reviewed. Allergies and medications reviewed and updated. Current Outpatient Prescriptions on File Prior to Visit  Medication Sig  . albuterol (PROAIR HFA) 108 (90 BASE) MCG/ACT inhaler Inhale 2 puffs into the lungs every 6 (six) hours as needed for wheezing or shortness of breath.   [name] b complex vitamins tablet Take 1 tablet by mouth daily.   [name] buPROPion (WELLBUTRIN XL) 150 MG 24 hr tablet Take 1 tablet (150 mg total) by mouth daily.  . darbepoetin (ARANESP) 200 MCG/0.4ML SOLN injection Inject 200 mcg into the skin every 14 (fourteen) days.  . diphenhydrAMINE-zinc acetate (BENADRYL) cream Apply topically 3 (three) times daily as needed for itching.  . furosemide (LASIX) 40 MG tablet Take 80 mg by mouth 2 (two) times daily.   [name] glucose blood test strip 1 each by Other route as directed. Use to check sugar 4 times daily and as needed Dx: E10.40 **ONE TOUCH ULTRA**  . guaiFENesin (MUCINEX) 600 MG 12 hr tablet Take 600 mg by  mouth 2 (two) times daily. 2 tablet by mouth once daily  . Insulin Glargine (LANTUS SOLOSTAR) 100 UNIT/ML Solostar Pen Inject 140 Units into the skin every morning.  . insulin lispro (HUMALOG) 100 UNIT/ML KiwkPen INJECT 5-30 UNITS INTO THE SKIN AT 4PM PER SLIDING SCALE (5 for every 50 over 150)  . Insulin Pen Needle (B-D ULTRAFINE III SHORT PEN) 31G X 8 MM MISC Use to inject insulin 2 times daily as instructed.  . lactulose, encephalopathy, (CHRONULAC) 10 GM/15ML SOLN Take 30 mLs (20 g total) by mouth 2 (two) times daily. constipation  . levalbuterol (XOPENEX) 0.63 MG/3ML nebulizer solution Take 0.63 mg by nebulization every 8 (eight) hours as needed for shortness of breath.   . lisinopril (PRINIVIL,ZESTRIL) 5 MG tablet Take 5 mg by mouth every evening.   . loratadine (CLARITIN) 10 MG tablet TAKE 1 TABLET EVERY DAY  . Multiple Vitamins-Minerals (CENTRUM SILVER PO) Take 1 tablet by mouth daily.   . mupirocin cream (BACTROBAN) 2 % APPLY 1 APPLICATION TOPICALLY 2 (TWO) TIMES DAILY. FOR SKIN LESIONS  . nadolol (CORGARD) 20 MG tablet TAKE 1/2 TABLET BY MOUTH DAILY  . nitroGLYCERIN (NITROSTAT) 0.4 MG SL tablet Place 1 tablet (0.4 mg total) under the tongue every 5 (five) minutes as needed for chest pain. (Patient not taking: Reported on 01/05/2015)  . NON FORMULARY 3 liters oxygen 24/7  . ONE TOUCH LANCETS MISC Use to check sugar 4 times daily and as  needed. EQ:A83.41 **ONE TOUCH DELICA**  . oxyCODONE (OXY IR/ROXICODONE) 5 MG immediate release tablet Take one tablet by mouth every 4 hours as needed for pain  . pantoprazole (PROTONIX) 40 MG tablet Take 1 tablet (40 mg total) by mouth 2 (two) times daily.  . polyethylene glycol (MIRALAX / GLYCOLAX) packet Take 17 g by mouth daily as needed for moderate constipation.  [name] PRISTIQ 50 MG 24 hr tablet TAKE 3 TABLETS BY MOUTH EVERY DAY  . promethazine (PHENERGAN) 25 MG tablet Take 1 tablet (25 mg total) by mouth 2 (two) times daily as needed for nausea.  [name]  QUEtiapine (SEROQUEL) 200 MG tablet Take 1 tablet (200 mg total) by mouth at bedtime.  . Saline (OCEAN NASAL SPRAY NA) Place 1 spray into the nose 3 (three) times daily as needed (congestion).   . simvastatin (ZOCOR) 40 MG tablet TAKE 1 TABLET BY MOUTH EVERY EVENING  . spironolactone (ALDACTONE) 25 MG tablet TAKE 1/2 TABLET BY MOUTH DAILY  . Vitamin D, Ergocalciferol, (DRISDOL) 50000 UNITS CAPS capsule TAKE 1 CAPSULE (50,000 UNITS TOTAL) BY MOUTH EVERY 7 (SEVEN) DAYS. TAKE ON FRIDAY  . XIFAXAN 550 MG TABS tablet TAKE 1 TABLET (550 MG TOTAL) BY MOUTH DAILY.   No current facility-administered medications on file prior to visit.   Past Medical History  Diagnosis Date  . Chronic airway obstruction, not elsewhere classified   . Unspecified chronic bronchitis   . Unspecified essential hypertension   . Non-obstructive CAD   . Palpitations   . Mild aortic stenosis     a. 03/2014 Valve area (VTI): 2.89 cm^2, Valve area (Vmax): 2.7 cm^2.  . Pure hypercholesterolemia   . Morbid obesity   . Esophageal reflux   . GAVE (gastric antral vascular ectasia)     angiodysplasia  . Diverticulosis of colon (without mention of hemorrhage)   . Benign neoplasm of colon   . Autoimmune hepatitis   . Osteoarthrosis, unspecified whether generalized or localized, unspecified site   . Osteoporosis, unspecified   . Restless legs syndrome (RLS)   . Major depressive disorder, recurrent episode, severe, specified as with psychotic behavior   . Iron deficiency anemia secondary to blood loss (chronic)     a. frequent PRBC transfusions.  . Coarse tremors     a. arms.  . Carpal tunnel syndrome on both sides   . Palpitations   . Chronic diastolic CHF (congestive heart failure)     a. 03/26/2014 Echo: EF 55-60%, no rwma, mild AS/AI, mod-sev Ca2+ MV annulus, mildly to mod dil LA.  [name] Asthma   . OSA (obstructive sleep apnea)     a. does not use CPAP.  [name] Type II diabetes mellitus   . H/O hiatal hernia   . Liver cirrhosis  secondary to nonalcoholic steatohepatitis (NASH)     a. dx'd 1990's  . Adenomatous colon polyp   . Midsternal chest pain     a. conservatively managed ->poor candidate for cath/anticoagulation.  [name] Anxiety   . Portal hypertensive gastropathy   . Falls frequently     completed HHPT/OT 06/2014, PT unmet goals  . Recurrent UTI (urinary tract infection)     h/o hospitalization with urosepsis 2015, but thought large component colonization/bacteriuria, only treat if symptomatic ([place])  . Hiatal hernia   . Nonalcoholic steatohepatitis (NASH)   . Thrombocytopenia   . Protein calorie malnutrition   . Family history of adverse reaction to anesthesia     Mother and sister were hard to wake up  .  History of pneumonia     CAP 2015     Review of Systems Per HPI unless specifically indicated above     Objective:    BP 120/72 mmHg  Pulse 48  Temp(Src) 98.5 F (36.9 C) (Oral)  Resp 20  Wt 220 lb 8 oz (100.018 kg)  SpO2 98%  Wt Readings from Last 3 Encounters:  01/16/15 220 lb 8 oz (100.018 kg)  01/05/15 219 lb (99.338 kg)  01/02/15 222 lb 6.4 oz (100.88 kg)    Physical Exam  Constitutional: She appears well-developed and well-nourished. No distress.  HENT:  Head: Normocephalic and atraumatic.  Right Ear: Hearing, tympanic membrane, external ear and ear canal normal.  Left Ear: Hearing, tympanic membrane, external ear and ear canal normal.  Nose: Mucosal edema (with nasal injection) and rhinorrhea present. Right sinus exhibits no maxillary sinus tenderness and no frontal sinus tenderness. Left sinus exhibits no maxillary sinus tenderness and no frontal sinus tenderness.  Mouth/Throat: Uvula is midline, oropharynx is clear and moist and mucous membranes are normal. No oropharyngeal exudate, posterior oropharyngeal edema, posterior oropharyngeal erythema or tonsillar abscesses.  Eyes: Conjunctivae and EOM are normal. Pupils are equal, round, and reactive to light. No scleral icterus.    Neck: Normal range of motion. Neck supple.  Cardiovascular: Normal rate, regular rhythm, normal heart sounds and intact distal pulses.   No murmur heard. Pulmonary/Chest: Effort normal and breath sounds normal. No respiratory distress. She has no wheezes. She has no rales.  Lungs clear  Lymphadenopathy:    She has no cervical adenopathy.  Skin: Skin is warm and dry. No rash noted.  Nursing note and vitals reviewed.  Results for orders placed or performed during the hospital encounter of 01/06/15  CBC with Differential/Platelet  Result Value Ref Range   WBC 2.9 (L) 4.0 - 10.5 K/uL   RBC 2.68 (L) 3.87 - 5.11 MIL/uL   Hemoglobin 8.8 (L) 12.0 - 15.0 g/dL   HCT 27.5 (L) 36.0 - 46.0 %   MCV 102.6 (H) 78.0 - 100.0 fL   MCH 32.8 26.0 - 34.0 pg   MCHC 32.0 30.0 - 36.0 g/dL   RDW 15.0 11.5 - 15.5 %   Platelets 65 (L) 150 - 400 K/uL   Neutrophils Relative % 75 43 - 77 %   Neutro Abs 2.1 1.7 - 7.7 K/uL   Lymphocytes Relative 12 12 - 46 %   Lymphs Abs 0.3 (L) 0.7 - 4.0 K/uL   Monocytes Relative 9 3 - 12 %   Monocytes Absolute 0.3 0.1 - 1.0 K/uL   Eosinophils Relative 5 0 - 5 %   Eosinophils Absolute 0.1 0.0 - 0.7 K/uL   Basophils Relative 0 0 - 1 %   Basophils Absolute 0.0 0.0 - 0.1 K/uL   *Note: Due to a large number of results and/or encounters for the requested time period, some results have not been displayed. A complete set of results can be found in Results Review.      Assessment & Plan:   Problem List Items Addressed This Visit    Acute bronchitis - Primary    Complicated by comorbidities and recent abx use.  Will treat supportively with rest and fluids, continue delsym and plain mucinex WASP for zpack provided today in case worsening or not improving over next several days. Pt agrees with plan. States she has taken zpack and seroquel together and tolerated well in the past.       Other Visit Diagnoses  Adjustment disorder with mixed anxiety and depressed mood         Relevant Medications    ALPRAZolam (XANAX) 1 MG tablet        Follow up plan: Return if symptoms worsen or fail to improve.

## 2015-01-16 NOTE — Progress Notes (Signed)
Pre visit review using our clinic review tool, if applicable. No additional management support is needed unless otherwise documented below in the visit note. 

## 2015-01-16 NOTE — Assessment & Plan Note (Signed)
Complicated by comorbidities and recent abx use.  Will treat supportively with rest and fluids, continue delsym and plain mucinex WASP for zpack provided today in case worsening or not improving over next several days. Pt agrees with plan. States she has taken zpack and seroquel together and tolerated well in the past.

## 2015-01-16 NOTE — Patient Instructions (Signed)
Lung sound clear today. Take delsym at night time, plain mucinex with plenty of water during the day to help mobilize mucous out of chest. If worsening fever >101, worsening productive cough, or not improving with time, fill antibiotic zpack provided today. Call us with questions.

## 2015-01-17 ENCOUNTER — Ambulatory Visit: Payer: Medicare Other | Admitting: Endocrinology

## 2015-01-17 NOTE — Telephone Encounter (Signed)
[facility] - Client TELEPHONE ADVICE RECORD [facility] Call Center  Patient Name: [name]  Gender: Female  DOB: 11-Jul-1941   Age: 74 Y 56 M 1 D  Return Phone Number: [phone] (Primary), [phone] (Secondary)  Address: [place]   City/State/Zip: [place] [address]   Client [facility] - Client  Client Site [facility] - Day  Physician Ria Bush   Contact Type Call  Call Type Triage / Clinical  Relationship To Patient Self  Return Phone Number [phone] (Secondary)  Chief Complaint Sore Throat  Initial Comment Caller states she has a sore throat. 97.3 AX   [place] Not Listed Homecare   PreDisposition Did not know what to do   Nurse Assessment  Nurse: Venetia Maxon, RN, Manuela Schwartz Date/Time (Eastern Time): 01/17/2015 5:32:28 PM  Confirm and document reason for call. If symptomatic, describe symptoms. ---Caller states she has a sore throat. for a few days no fever cough too. (she does that because she has emphysema ). She is on home oxygen . Last urine was 13min ago Saw her PCP yesterday and throat was good.  Has the patient traveled out of the country within the last 30 days? ---No  Does the patient require triage? ---Yes  Related visit to physician within the last 2 weeks? ---No  Does the PT have any chronic conditions? (i.e. diabetes, asthma, etc.) ---Yes  List chronic conditions. ---O2 dependent emphysema HTN liver disease IDDM blood sugar 170 normal for her     Guidelines      Guideline Title Affirmed Question Affirmed Notes Nurse Date/Time Eilene Ghazi Time)  Sore Throat [1] Sore throat with cough/cold symptoms AND [2] present < 5 days (all triage questions negative)  Whitaker, RN, Manuela Schwartz 01/17/2015 5:36:24 PM   Disp. Time Eilene Ghazi Time) Disposition Final User   01/17/2015 5:31:09 PM Attempt made - no message left  Foster Simpson    01/17/2015 5:37:54  PM Home Care Yes Venetia Maxon, RN, Edwena Bunde Understands: Yes  Disagree/Comply: Comply     Care Advice Given Per Guideline      HOME CARE: You should be able to treat this at home. REASSURANCE: * Most mild sore throats and intermittent sore throats are just part of a cold and can be treated at home. * Sip warm chicken broth or apple juice. SORE THROAT - For relief of sore throat: * Suck on hard candy or a throat lozenge (OTC). * Gargle with warm salt water four times a day. To make salt water, put 1/2 teaspoon of salt in 8 oz (240 ml) of warm water. SOFT DIET: * Drink plenty of liquids so as to avoid dehydration (8-12 eight oz glasses each day). CALL BACK IF: * Fever lasts over 3 days * You become worse * Sore throat with cold symptoms, and sore throat lasts over 5 days CARE ADVICE given per Sore Throat (Adult) guideline

## 2015-01-20 ENCOUNTER — Encounter (HOSPITAL_COMMUNITY)
Admission: RE | Admit: 2015-01-20 | Discharge: 2015-01-20 | Disposition: A | Discharge status: Home / Self Care | Payer: Medicare Other | Source: Ambulatory Visit | Attending: Family Medicine | Admitting: Family Medicine

## 2015-01-20 DIAGNOSIS — N289 Disorder of kidney and ureter, unspecified: Secondary | ICD-10-CM | POA: Diagnosis not present

## 2015-01-20 DIAGNOSIS — K7460 Unspecified cirrhosis of liver: Secondary | ICD-10-CM | POA: Diagnosis not present

## 2015-01-20 DIAGNOSIS — Q2733 Arteriovenous malformation of digestive system vessel: Secondary | ICD-10-CM | POA: Diagnosis not present

## 2015-01-20 DIAGNOSIS — D649 Anemia, unspecified: Secondary | ICD-10-CM | POA: Diagnosis not present

## 2015-01-20 LAB — POCT HEMOGLOBIN-HEMACUE: Hemoglobin: 8.5 g/dL — ABNORMAL LOW (ref 12.0–15.0)

## 2015-01-20 MED ORDER — DARBEPOETIN ALFA 200 MCG/0.4ML IJ SOSY
PREFILLED_SYRINGE | INTRAMUSCULAR | Status: DC
Start: 2015-01-20 — End: 2015-01-21
  Filled 2015-01-20: qty 0.4

## 2015-01-20 MED ORDER — DARBEPOETIN ALFA 200 MCG/0.4ML IJ SOSY
200.0000 ug | PREFILLED_SYRINGE | INTRAMUSCULAR | Status: DC
  Administered 2015-01-20: 200 ug via SUBCUTANEOUS

## 2015-01-22 DIAGNOSIS — S42019A Nondisplaced fracture of sternal end of unspecified clavicle, initial encounter for closed fracture: Secondary | ICD-10-CM | POA: Diagnosis not present

## 2015-01-22 DIAGNOSIS — J449 Chronic obstructive pulmonary disease, unspecified: Secondary | ICD-10-CM | POA: Diagnosis not present

## 2015-01-24 ENCOUNTER — Encounter: Payer: Self-pay | Admitting: *Deleted

## 2015-01-25 ENCOUNTER — Encounter (HOSPITAL_COMMUNITY): Payer: Self-pay | Admitting: *Deleted

## 2015-01-25 ENCOUNTER — Encounter: Payer: Medicare Other | Admitting: Physician Assistant

## 2015-01-25 ENCOUNTER — Emergency Department (HOSPITAL_COMMUNITY): Payer: Medicare Other

## 2015-01-25 ENCOUNTER — Inpatient Hospital Stay (HOSPITAL_COMMUNITY)
Admission: EM | Admit: 2015-01-25 | Discharge: 2015-01-28 | DRG: 191 | Disposition: A | Discharge status: Skilled Nursing Facility | Payer: Medicare Other | Attending: Internal Medicine | Admitting: Internal Medicine

## 2015-01-25 DIAGNOSIS — I10 Essential (primary) hypertension: Secondary | ICD-10-CM | POA: Diagnosis not present

## 2015-01-25 DIAGNOSIS — Z87891 Personal history of nicotine dependence: Secondary | ICD-10-CM

## 2015-01-25 DIAGNOSIS — N183 Chronic kidney disease, stage 3 unspecified: Secondary | ICD-10-CM | POA: Diagnosis present

## 2015-01-25 DIAGNOSIS — D61818 Other pancytopenia: Secondary | ICD-10-CM | POA: Diagnosis not present

## 2015-01-25 DIAGNOSIS — K31819 Angiodysplasia of stomach and duodenum without bleeding: Secondary | ICD-10-CM | POA: Diagnosis not present

## 2015-01-25 DIAGNOSIS — I5032 Chronic diastolic (congestive) heart failure: Secondary | ICD-10-CM | POA: Diagnosis present

## 2015-01-25 DIAGNOSIS — R0602 Shortness of breath: Secondary | ICD-10-CM | POA: Diagnosis not present

## 2015-01-25 DIAGNOSIS — M810 Age-related osteoporosis without current pathological fracture: Secondary | ICD-10-CM | POA: Diagnosis present

## 2015-01-25 DIAGNOSIS — T380X5A Adverse effect of glucocorticoids and synthetic analogues, initial encounter: Secondary | ICD-10-CM | POA: Diagnosis present

## 2015-01-25 DIAGNOSIS — K754 Autoimmune hepatitis: Secondary | ICD-10-CM | POA: Diagnosis not present

## 2015-01-25 DIAGNOSIS — E1165 Type 2 diabetes mellitus with hyperglycemia: Secondary | ICD-10-CM | POA: Diagnosis present

## 2015-01-25 DIAGNOSIS — Z880 Allergy status to penicillin: Secondary | ICD-10-CM | POA: Diagnosis not present

## 2015-01-25 DIAGNOSIS — J9610 Chronic respiratory failure, unspecified whether with hypoxia or hypercapnia: Secondary | ICD-10-CM | POA: Diagnosis present

## 2015-01-25 DIAGNOSIS — E780 Pure hypercholesterolemia: Secondary | ICD-10-CM | POA: Diagnosis present

## 2015-01-25 DIAGNOSIS — Z794 Long term (current) use of insulin: Secondary | ICD-10-CM | POA: Diagnosis not present

## 2015-01-25 DIAGNOSIS — K7581 Nonalcoholic steatohepatitis (NASH): Secondary | ICD-10-CM | POA: Diagnosis not present

## 2015-01-25 DIAGNOSIS — K766 Portal hypertension: Secondary | ICD-10-CM | POA: Diagnosis present

## 2015-01-25 DIAGNOSIS — Z886 Allergy status to analgesic agent status: Secondary | ICD-10-CM

## 2015-01-25 DIAGNOSIS — J45909 Unspecified asthma, uncomplicated: Secondary | ICD-10-CM | POA: Diagnosis present

## 2015-01-25 DIAGNOSIS — Z9049 Acquired absence of other specified parts of digestive tract: Secondary | ICD-10-CM | POA: Diagnosis present

## 2015-01-25 DIAGNOSIS — N3000 Acute cystitis without hematuria: Secondary | ICD-10-CM | POA: Diagnosis present

## 2015-01-25 DIAGNOSIS — K5730 Diverticulosis of large intestine without perforation or abscess without bleeding: Secondary | ICD-10-CM | POA: Diagnosis not present

## 2015-01-25 DIAGNOSIS — Z86010 Personal history of colonic polyps: Secondary | ICD-10-CM

## 2015-01-25 DIAGNOSIS — M1990 Unspecified osteoarthritis, unspecified site: Secondary | ICD-10-CM | POA: Diagnosis present

## 2015-01-25 DIAGNOSIS — Z79899 Other long term (current) drug therapy: Secondary | ICD-10-CM

## 2015-01-25 DIAGNOSIS — N390 Urinary tract infection, site not specified: Secondary | ICD-10-CM

## 2015-01-25 DIAGNOSIS — F333 Major depressive disorder, recurrent, severe with psychotic symptoms: Secondary | ICD-10-CM | POA: Diagnosis not present

## 2015-01-25 DIAGNOSIS — I350 Nonrheumatic aortic (valve) stenosis: Secondary | ICD-10-CM | POA: Diagnosis present

## 2015-01-25 DIAGNOSIS — I501 Left ventricular failure: Secondary | ICD-10-CM | POA: Diagnosis not present

## 2015-01-25 DIAGNOSIS — I359 Nonrheumatic aortic valve disorder, unspecified: Secondary | ICD-10-CM | POA: Diagnosis present

## 2015-01-25 DIAGNOSIS — J9611 Chronic respiratory failure with hypoxia: Secondary | ICD-10-CM

## 2015-01-25 DIAGNOSIS — K219 Gastro-esophageal reflux disease without esophagitis: Secondary | ICD-10-CM | POA: Diagnosis not present

## 2015-01-25 DIAGNOSIS — F329 Major depressive disorder, single episode, unspecified: Secondary | ICD-10-CM | POA: Diagnosis present

## 2015-01-25 DIAGNOSIS — E1122 Type 2 diabetes mellitus with diabetic chronic kidney disease: Secondary | ICD-10-CM | POA: Diagnosis not present

## 2015-01-25 DIAGNOSIS — Z9981 Dependence on supplemental oxygen: Secondary | ICD-10-CM | POA: Diagnosis not present

## 2015-01-25 DIAGNOSIS — J441 Chronic obstructive pulmonary disease with (acute) exacerbation: Secondary | ICD-10-CM | POA: Diagnosis not present

## 2015-01-25 DIAGNOSIS — M6281 Muscle weakness (generalized): Secondary | ICD-10-CM | POA: Diagnosis not present

## 2015-01-25 DIAGNOSIS — I129 Hypertensive chronic kidney disease with stage 1 through stage 4 chronic kidney disease, or unspecified chronic kidney disease: Secondary | ICD-10-CM | POA: Diagnosis present

## 2015-01-25 DIAGNOSIS — F4323 Adjustment disorder with mixed anxiety and depressed mood: Secondary | ICD-10-CM

## 2015-01-25 DIAGNOSIS — Z8701 Personal history of pneumonia (recurrent): Secondary | ICD-10-CM | POA: Diagnosis not present

## 2015-01-25 DIAGNOSIS — G2581 Restless legs syndrome: Secondary | ICD-10-CM | POA: Diagnosis present

## 2015-01-25 DIAGNOSIS — I2510 Atherosclerotic heart disease of native coronary artery without angina pectoris: Secondary | ICD-10-CM | POA: Diagnosis present

## 2015-01-25 DIAGNOSIS — G4733 Obstructive sleep apnea (adult) (pediatric): Secondary | ICD-10-CM | POA: Diagnosis not present

## 2015-01-25 DIAGNOSIS — R0600 Dyspnea, unspecified: Secondary | ICD-10-CM | POA: Diagnosis present

## 2015-01-25 DIAGNOSIS — F419 Anxiety disorder, unspecified: Secondary | ICD-10-CM | POA: Diagnosis not present

## 2015-01-25 HISTORY — DX: Dependence on supplemental oxygen: Z99.81

## 2015-01-25 LAB — CBC WITH DIFFERENTIAL/PLATELET
Basophils Absolute: 0 10*3/uL (ref 0.0–0.1)
Basophils Relative: 1 % (ref 0–1)
Eosinophils Absolute: 0.2 10*3/uL (ref 0.0–0.7)
Eosinophils Relative: 5 % (ref 0–5)
HCT: 26.3 % — ABNORMAL LOW (ref 36.0–46.0)
Hemoglobin: 8.7 g/dL — ABNORMAL LOW (ref 12.0–15.0)
LYMPHS PCT: 10 % — AB (ref 12–46)
Lymphs Abs: 0.4 10*3/uL — ABNORMAL LOW (ref 0.7–4.0)
MCH: 33 pg (ref 26.0–34.0)
MCHC: 33.1 g/dL (ref 30.0–36.0)
MCV: 99.6 fL (ref 78.0–100.0)
Monocytes Absolute: 0.2 10*3/uL (ref 0.1–1.0)
Monocytes Relative: 6 % (ref 3–12)
Neutro Abs: 3 10*3/uL (ref 1.7–7.7)
Neutrophils Relative %: 78 % — ABNORMAL HIGH (ref 43–77)
PLATELETS: 108 10*3/uL — AB (ref 150–400)
RBC: 2.64 MIL/uL — ABNORMAL LOW (ref 3.87–5.11)
RDW: 15.5 % (ref 11.5–15.5)
WBC: 3.8 10*3/uL — ABNORMAL LOW (ref 4.0–10.5)

## 2015-01-25 LAB — BASIC METABOLIC PANEL
Anion gap: 15 (ref 5–15)
BUN: 66 mg/dL — AB (ref 6–20)
CALCIUM: 9.4 mg/dL (ref 8.9–10.3)
CHLORIDE: 94 mmol/L — AB (ref 101–111)
CO2: 21 mmol/L — ABNORMAL LOW (ref 22–32)
Creatinine, Ser: 1.68 mg/dL — ABNORMAL HIGH (ref 0.44–1.00)
GFR calc Af Amer: 34 mL/min — ABNORMAL LOW (ref >60)
GFR calc non Af Amer: 30 mL/min — ABNORMAL LOW (ref >60)
GLUCOSE: 546 mg/dL — AB (ref 65–99)
POTASSIUM: 5.7 mmol/L — AB (ref 3.5–5.1)
Sodium: 130 mmol/L — ABNORMAL LOW (ref 135–145)

## 2015-01-25 LAB — COMPREHENSIVE METABOLIC PANEL
ALBUMIN: 3.3 g/dL — AB (ref 3.5–5.0)
ALK PHOS: 99 U/L (ref 38–126)
ALT: 27 U/L (ref 14–54)
ANION GAP: 10 (ref 5–15)
AST: 30 U/L (ref 15–41)
BUN: 74 mg/dL — AB (ref 6–20)
CALCIUM: 9.6 mg/dL (ref 8.9–10.3)
CO2: 26 mmol/L (ref 22–32)
Chloride: 99 mmol/L — ABNORMAL LOW (ref 101–111)
Creatinine, Ser: 1.61 mg/dL — ABNORMAL HIGH (ref 0.44–1.00)
GFR calc non Af Amer: 31 mL/min — ABNORMAL LOW (ref >60)
GFR, EST AFRICAN AMERICAN: 36 mL/min — AB (ref >60)
Glucose, Bld: 323 mg/dL — ABNORMAL HIGH (ref 65–99)
Potassium: 4.6 mmol/L (ref 3.5–5.1)
Sodium: 135 mmol/L (ref 135–145)
Total Bilirubin: 1.3 mg/dL — ABNORMAL HIGH (ref 0.3–1.2)
Total Protein: 6.3 g/dL — ABNORMAL LOW (ref 6.5–8.1)

## 2015-01-25 LAB — URINALYSIS, ROUTINE W REFLEX MICROSCOPIC
BILIRUBIN URINE: NEGATIVE
Glucose, UA: 500 mg/dL — AB
HGB URINE DIPSTICK: NEGATIVE
Ketones, ur: NEGATIVE mg/dL
Nitrite: POSITIVE — AB
Protein, ur: NEGATIVE mg/dL
Specific Gravity, Urine: 1.013 (ref 1.005–1.030)
UROBILINOGEN UA: 0.2 mg/dL (ref 0.0–1.0)
pH: 5.5 (ref 5.0–8.0)

## 2015-01-25 LAB — URINE MICROSCOPIC-ADD ON

## 2015-01-25 LAB — BRAIN NATRIURETIC PEPTIDE: B Natriuretic Peptide: 95.5 pg/mL (ref 0.0–100.0)

## 2015-01-25 LAB — GLUCOSE, CAPILLARY: Glucose-Capillary: 525 mg/dL — ABNORMAL HIGH (ref 65–99)

## 2015-01-25 LAB — TROPONIN I: Troponin I: <0.03 ng/mL (ref <0.031)

## 2015-01-25 MED ORDER — ACETAMINOPHEN 650 MG RE SUPP: 650.0000 mg | Freq: Four times a day (QID) | RECTAL | Status: DC | PRN

## 2015-01-25 MED ORDER — SIMVASTATIN 40 MG PO TABS
40.0000 mg | ORAL_TABLET | Freq: Every evening | ORAL | Status: DC
  Administered 2015-01-25 – 2015-01-27 (×3): 40 mg via ORAL
  Filled 2015-01-25 (×4): qty 1

## 2015-01-25 MED ORDER — HEPARIN SODIUM (PORCINE) 5000 UNIT/ML IJ SOLN: 5000.0000 [IU] | Freq: Three times a day (TID) | INTRAMUSCULAR | Status: DC

## 2015-01-25 MED ORDER — SODIUM CHLORIDE 0.9 % IJ SOLN: 3.0000 mL | INTRAMUSCULAR | Status: DC | PRN

## 2015-01-25 MED ORDER — INSULIN GLARGINE 100 UNIT/ML ~~LOC~~ SOLN
100.0000 [IU] | Freq: Every morning | SUBCUTANEOUS | Status: DC
  Filled 2015-01-25: qty 1

## 2015-01-25 MED ORDER — SODIUM CHLORIDE 0.9 % IJ SOLN
3.0000 mL | Freq: Two times a day (BID) | INTRAMUSCULAR | Status: DC
  Administered 2015-01-27 (×2): 3 mL via INTRAVENOUS

## 2015-01-25 MED ORDER — NADOLOL 20 MG PO TABS
10.0000 mg | ORAL_TABLET | Freq: Every day | ORAL | Status: DC
  Administered 2015-01-25 – 2015-01-26 (×2): 10 mg via ORAL
  Filled 2015-01-25 (×3): qty 1

## 2015-01-25 MED ORDER — LACTULOSE ENCEPHALOPATHY 10 GM/15ML PO SOLN
20.0000 g | Freq: Two times a day (BID) | ORAL | Status: DC
  Administered 2015-01-25 – 2015-01-27 (×2): 20 g via ORAL
  Filled 2015-01-25 (×7): qty 30

## 2015-01-25 MED ORDER — BUDESONIDE 0.5 MG/2ML IN SUSP
0.5000 mg | Freq: Two times a day (BID) | RESPIRATORY_TRACT | Status: DC
  Administered 2015-01-25 – 2015-01-27 (×4): 0.5 mg via RESPIRATORY_TRACT
  Filled 2015-01-25 (×9): qty 2

## 2015-01-25 MED ORDER — IPRATROPIUM-ALBUTEROL 0.5-2.5 (3) MG/3ML IN SOLN
3.0000 mL | Freq: Four times a day (QID) | RESPIRATORY_TRACT | Status: DC
  Administered 2015-01-25: 3 mL via RESPIRATORY_TRACT
  Filled 2015-01-25: qty 3

## 2015-01-25 MED ORDER — ALPRAZOLAM 0.5 MG PO TABS
0.5000 mg | ORAL_TABLET | Freq: Three times a day (TID) | ORAL | Status: DC | PRN
  Administered 2015-01-26 (×2): 0.5 mg via ORAL
  Filled 2015-01-25 (×2): qty 1

## 2015-01-25 MED ORDER — INSULIN GLARGINE 100 UNIT/ML ~~LOC~~ SOLN
10.0000 [IU] | SUBCUTANEOUS | Status: AC
  Administered 2015-01-25: 10 [IU] via SUBCUTANEOUS
  Filled 2015-01-25: qty 0.1

## 2015-01-25 MED ORDER — IPRATROPIUM-ALBUTEROL 0.5-2.5 (3) MG/3ML IN SOLN: 3.0000 mL | RESPIRATORY_TRACT | Status: DC | PRN

## 2015-01-25 MED ORDER — IPRATROPIUM-ALBUTEROL 0.5-2.5 (3) MG/3ML IN SOLN
3.0000 mL | Freq: Two times a day (BID) | RESPIRATORY_TRACT | Status: DC
  Administered 2015-01-26 – 2015-01-27 (×4): 3 mL via RESPIRATORY_TRACT
  Filled 2015-01-25 (×4): qty 3

## 2015-01-25 MED ORDER — ALUM & MAG HYDROXIDE-SIMETH 200-200-20 MG/5ML PO SUSP
30.0000 mL | Freq: Four times a day (QID) | ORAL | Status: DC | PRN
  Administered 2015-01-27 (×2): 30 mL via ORAL
  Filled 2015-01-25 (×2): qty 30

## 2015-01-25 MED ORDER — LEVALBUTEROL HCL 0.63 MG/3ML IN NEBU
0.6300 mg | INHALATION_SOLUTION | Freq: Four times a day (QID) | RESPIRATORY_TRACT | Status: DC
  Administered 2015-01-25: 0.63 mg via RESPIRATORY_TRACT
  Filled 2015-01-25 (×4): qty 3

## 2015-01-25 MED ORDER — INSULIN ASPART 100 UNIT/ML ~~LOC~~ SOLN
20.0000 [IU] | Freq: Once | SUBCUTANEOUS | Status: AC
  Administered 2015-01-25: 20 [IU] via SUBCUTANEOUS

## 2015-01-25 MED ORDER — RIFAXIMIN 550 MG PO TABS
550.0000 mg | ORAL_TABLET | Freq: Two times a day (BID) | ORAL | Status: DC
  Administered 2015-01-25 – 2015-01-28 (×6): 550 mg via ORAL
  Filled 2015-01-25 (×7): qty 1

## 2015-01-25 MED ORDER — INSULIN ASPART 100 UNIT/ML ~~LOC~~ SOLN
0.0000 [IU] | Freq: Three times a day (TID) | SUBCUTANEOUS | Status: DC
  Administered 2015-01-26 (×2): 15 [IU] via SUBCUTANEOUS

## 2015-01-25 MED ORDER — LORATADINE 10 MG PO TABS
10.0000 mg | ORAL_TABLET | Freq: Every day | ORAL | Status: DC
  Administered 2015-01-25 – 2015-01-28 (×4): 10 mg via ORAL
  Filled 2015-01-25 (×4): qty 1

## 2015-01-25 MED ORDER — SODIUM CHLORIDE 0.9 % IV SOLN
INTRAVENOUS | Status: AC
  Administered 2015-01-25: 16:00:00 via INTRAVENOUS

## 2015-01-25 MED ORDER — METHYLPREDNISOLONE SODIUM SUCC 125 MG IJ SOLR
60.0000 mg | Freq: Three times a day (TID) | INTRAMUSCULAR | Status: DC
  Administered 2015-01-25 – 2015-01-27 (×6): 60 mg via INTRAVENOUS
  Filled 2015-01-25 (×9): qty 0.96

## 2015-01-25 MED ORDER — INSULIN ASPART 100 UNIT/ML ~~LOC~~ SOLN
4.0000 [IU] | Freq: Three times a day (TID) | SUBCUTANEOUS | Status: DC
  Administered 2015-01-26 – 2015-01-28 (×7): 4 [IU] via SUBCUTANEOUS

## 2015-01-25 MED ORDER — ROPINIROLE HCL 1 MG PO TABS
4.0000 mg | ORAL_TABLET | Freq: Every day | ORAL | Status: DC
  Administered 2015-01-25 – 2015-01-27 (×3): 4 mg via ORAL
  Filled 2015-01-25 (×4): qty 4

## 2015-01-25 MED ORDER — ALPRAZOLAM 0.25 MG PO TABS
1.0000 mg | ORAL_TABLET | Freq: Once | ORAL | Status: AC
  Administered 2015-01-25: 1 mg via ORAL
  Filled 2015-01-25: qty 4

## 2015-01-25 MED ORDER — SALINE NASAL SPRAY 0.65 % NA SOLN
2.0000 | NASAL | Status: DC | PRN
  Administered 2015-01-26 – 2015-01-27 (×3): 2 via NASAL
  Filled 2015-01-25: qty 30

## 2015-01-25 MED ORDER — DOXYCYCLINE HYCLATE 100 MG PO TABS
100.0000 mg | ORAL_TABLET | Freq: Two times a day (BID) | ORAL | Status: DC
  Administered 2015-01-25 – 2015-01-28 (×6): 100 mg via ORAL
  Filled 2015-01-25 (×7): qty 1

## 2015-01-25 MED ORDER — POLYETHYLENE GLYCOL 3350 17 G PO PACK
17.0000 g | PACK | Freq: Every day | ORAL | Status: DC | PRN
  Filled 2015-01-25: qty 1

## 2015-01-25 MED ORDER — FOSFOMYCIN TROMETHAMINE 3 G PO PACK
3.0000 g | PACK | Freq: Once | ORAL | Status: AC
  Administered 2015-01-25: 3 g via ORAL
  Filled 2015-01-25: qty 3

## 2015-01-25 MED ORDER — PANTOPRAZOLE SODIUM 40 MG PO TBEC
40.0000 mg | DELAYED_RELEASE_TABLET | Freq: Two times a day (BID) | ORAL | Status: DC
  Administered 2015-01-25 – 2015-01-28 (×6): 40 mg via ORAL
  Filled 2015-01-25 (×5): qty 1

## 2015-01-25 MED ORDER — OXYCODONE HCL 5 MG PO TABS
5.0000 mg | ORAL_TABLET | ORAL | Status: DC | PRN
  Administered 2015-01-25 – 2015-01-28 (×5): 5 mg via ORAL
  Filled 2015-01-25 (×5): qty 1

## 2015-01-25 MED ORDER — ALBUTEROL SULFATE (2.5 MG/3ML) 0.083% IN NEBU
5.0000 mg | INHALATION_SOLUTION | Freq: Once | RESPIRATORY_TRACT | Status: DC
  Filled 2015-01-25: qty 6

## 2015-01-25 MED ORDER — METHYLPREDNISOLONE SODIUM SUCC 125 MG IJ SOLR
125.0000 mg | Freq: Once | INTRAMUSCULAR | Status: AC
  Administered 2015-01-25: 125 mg via INTRAVENOUS
  Filled 2015-01-25: qty 2

## 2015-01-25 MED ORDER — SODIUM CHLORIDE 0.9 % IV SOLN: 250.0000 mL | INTRAVENOUS | Status: DC | PRN

## 2015-01-25 MED ORDER — QUETIAPINE FUMARATE 200 MG PO TABS
200.0000 mg | ORAL_TABLET | Freq: Every day | ORAL | Status: DC
  Administered 2015-01-25 – 2015-01-27 (×3): 200 mg via ORAL
  Filled 2015-01-25 (×5): qty 1

## 2015-01-25 MED ORDER — GUAIFENESIN ER 600 MG PO TB12
600.0000 mg | ORAL_TABLET | Freq: Two times a day (BID) | ORAL | Status: DC
  Administered 2015-01-25 – 2015-01-28 (×6): 600 mg via ORAL
  Filled 2015-01-25 (×7): qty 1

## 2015-01-25 MED ORDER — IPRATROPIUM-ALBUTEROL 0.5-2.5 (3) MG/3ML IN SOLN
3.0000 mL | Freq: Four times a day (QID) | RESPIRATORY_TRACT | Status: DC | PRN
  Administered 2015-01-27: 3 mL via RESPIRATORY_TRACT
  Filled 2015-01-25: qty 3

## 2015-01-25 MED ORDER — FUROSEMIDE 40 MG PO TABS
40.0000 mg | ORAL_TABLET | Freq: Every day | ORAL | Status: DC
  Administered 2015-01-25 – 2015-01-27 (×3): 40 mg via ORAL
  Filled 2015-01-25 (×4): qty 1

## 2015-01-25 MED ORDER — NITROGLYCERIN 0.4 MG SL SUBL: 0.4000 mg | SUBLINGUAL_TABLET | SUBLINGUAL | Status: DC | PRN

## 2015-01-25 MED ORDER — DOCUSATE SODIUM 100 MG PO CAPS
100.0000 mg | ORAL_CAPSULE | Freq: Two times a day (BID) | ORAL | Status: DC
  Administered 2015-01-25 – 2015-01-27 (×2): 100 mg via ORAL
  Filled 2015-01-25 (×7): qty 1

## 2015-01-25 MED ORDER — BUPROPION HCL ER (XL) 150 MG PO TB24
150.0000 mg | ORAL_TABLET | Freq: Every day | ORAL | Status: DC
  Administered 2015-01-25 – 2015-01-28 (×4): 150 mg via ORAL
  Filled 2015-01-25 (×4): qty 1

## 2015-01-25 MED ORDER — FUROSEMIDE 40 MG PO TABS: 40.0000 mg | ORAL_TABLET | Freq: Two times a day (BID) | ORAL | Status: DC

## 2015-01-25 MED ORDER — SODIUM POLYSTYRENE SULFONATE 15 GM/60ML PO SUSP
15.0000 g | Freq: Once | ORAL | Status: AC
  Administered 2015-01-26: 15 g via ORAL
  Filled 2015-01-25: qty 60

## 2015-01-25 MED ORDER — LEVALBUTEROL HCL 0.63 MG/3ML IN NEBU
0.6300 mg | INHALATION_SOLUTION | Freq: Four times a day (QID) | RESPIRATORY_TRACT | Status: DC | PRN
  Filled 2015-01-25: qty 3

## 2015-01-25 MED ORDER — SODIUM CHLORIDE 0.9 % IJ SOLN
3.0000 mL | Freq: Two times a day (BID) | INTRAMUSCULAR | Status: DC
  Administered 2015-01-26 – 2015-01-27 (×2): 3 mL via INTRAVENOUS

## 2015-01-25 MED ORDER — SPIRONOLACTONE 12.5 MG HALF TABLET
12.5000 mg | ORAL_TABLET | Freq: Every day | ORAL | Status: DC
  Administered 2015-01-25 – 2015-01-28 (×4): 12.5 mg via ORAL
  Filled 2015-01-25 (×4): qty 1

## 2015-01-25 MED ORDER — ACETAMINOPHEN 325 MG PO TABS: 650.0000 mg | ORAL_TABLET | Freq: Four times a day (QID) | ORAL | Status: DC | PRN

## 2015-01-25 MED ORDER — FUROSEMIDE 80 MG PO TABS
80.0000 mg | ORAL_TABLET | Freq: Every day | ORAL | Status: DC
  Administered 2015-01-26 – 2015-01-28 (×3): 80 mg via ORAL
  Filled 2015-01-25 (×4): qty 1

## 2015-01-25 MED ORDER — VENLAFAXINE HCL ER 150 MG PO CP24
150.0000 mg | ORAL_CAPSULE | Freq: Every day | ORAL | Status: DC
  Administered 2015-01-26 – 2015-01-28 (×3): 150 mg via ORAL
  Filled 2015-01-25 (×4): qty 1

## 2015-01-25 NOTE — ED Notes (Signed)
Pt reports hx of sob but it has worsened over the last several days. Pt states that she is having difficulty with daily activities due to SOB with exertion. Pt denies pain

## 2015-01-25 NOTE — ED Notes (Signed)
Attempted report 

## 2015-01-25 NOTE — H&P (Signed)
[facility] History and Physical                                                                                    [name], is a 74 y.o. female  MRN: [id number]   DOB - [birth year]  Admit Date - 01/25/2015  Outpatient Primary MD for the patient is Ria Bush, MD  Referring Physician:  Dr. Vanita Panda   Chief Complaint:   Chief Complaint  Patient presents with  . Shortness of Breath     HPI  [name]  is a 74 y.o. female, with a past medical history of [place], diastolic heart failure, portal hypertension, COPD on 3 L of oxygen chronically at home, and aortic stenosis. She presented to the emergency department with a sudden onset of shortness of breath this morning.  Ms. Pollick reports that approximately 2 weeks ago she was placed on Z-Pak and Keflex for a urinary tract infection and possible upper respiratory infection. She was coughing up green sputum at that time. The green sputum improved. However, over the last couple of weeks she hasn't been able to get around her house (from the bathroom to the bedroom) as she did previously. This morning she reports feeling suddenly short of breath, panting and gasping for breath. She complains of being up all night with increased frequency of urine and a runny nose.  In the emergency department the patient appears very anxious breathing 27+ times per minute.  She is requiring 4+ liters of oxygen to maintain her oxygen sats. Her UA appears positive for urinary tract infection. We will admit her for COPD exacerbation and UTI.  Review of Systems   In addition to the HPI above,  No Fever-chills, No Headache, No changes with Vision or hearing, No problems swallowing food or Liquids, No Chest pain No Abdominal pain, No Nausea or Vomiting, Bowel movements are regular, No Blood in stool or Urine, No new skin rashes or bruises, No new joints pains-aches,  No new weakness, tingling, numbness in any extremity, No recent weight gain or  loss, A full 10 point Review of Systems was done, except as stated above, all other Review of Systems were negative.  Past Medical History  Past Medical History  Diagnosis Date  . Chronic airway obstruction, not elsewhere classified   . Unspecified chronic bronchitis   . Unspecified essential hypertension   . Non-obstructive CAD   . Palpitations   . Mild aortic stenosis     a. 03/2014 Valve area (VTI): 2.89 cm^2, Valve area (Vmax): 2.7 cm^2.  . Pure hypercholesterolemia   . Morbid obesity   . Esophageal reflux   . GAVE (gastric antral vascular ectasia)     angiodysplasia  . Diverticulosis of colon (without mention of hemorrhage)   . Benign neoplasm of colon   . Autoimmune hepatitis   . Osteoarthrosis, unspecified whether generalized or localized, unspecified site   . Osteoporosis, unspecified   . Restless legs syndrome (RLS)   . Major depressive disorder, recurrent episode, severe, specified as with psychotic behavior   . Iron deficiency anemia secondary to blood loss (chronic)     a. frequent PRBC transfusions.  . Coarse  tremors     a. arms.  . Carpal tunnel syndrome on both sides   . Palpitations   . Chronic diastolic CHF (congestive heart failure)     a. 03/26/2014 Echo: EF 55-60%, no rwma, mild AS/AI, mod-sev Ca2+ MV annulus, mildly to mod dil LA.  [name] Asthma   . Type II diabetes mellitus   . H/O hiatal hernia   . 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 dx'd 1990's  . Adenomatous colon polyp   . Midsternal chest pain     a. conservatively managed ->poor candidate for cath/anticoagulation.  [name] Anxiety   . Portal hypertensive gastropathy   . Falls frequently     completed HHPT/OT 06/2014, PT unmet goals  . Recurrent UTI (urinary tract infection)     h/o hospitalization with urosepsis 2015, but thought large component colonization/bacteriuria, only treat if symptomatic ([place])  . Hiatal hernia   . Nonalcoholic steatohepatitis (NASH)   .  Thrombocytopenia   . Protein calorie malnutrition   . Family history of adverse reaction to anesthesia     Mother and sister were hard to wake up  . History of pneumonia     CAP 2015  . On home oxygen therapy     "3L; 24/7" (01/25/2015)  . OSA (obstructive sleep apnea)     a. does not use CPAP. (01/25/2015)    Past Surgical History  Procedure Laterality Date  . Hemorroidectomy    . Appendectomy    . Vaginal hysterectomy    . Breast biopsy      bilateral  . Cesarean section      x 3  . Appendectomy    . Esophagogastroduodenoscopy  06/12/2012    Procedure: ESOPHAGOGASTRODUODENOSCOPY (EGD);  Surgeon: Lafayette Dragon, MD;  Location: [facility];  Service: Endoscopy;  Laterality: N/A;  . Balloon dilation  06/12/2012    Procedure: BALLOON DILATION;  Surgeon: Lafayette Dragon, MD;  Location: [facility];  Service: Endoscopy;  Laterality: N/A;  ?balloon  . Esophagogastroduodenoscopy  09/10/2012    Procedure: ESOPHAGOGASTRODUODENOSCOPY (EGD);  Surgeon: Lafayette Dragon, MD;  Location: [facility];  Service: Endoscopy;  Laterality: N/A;  . Hot hemostasis  09/10/2012    Procedure: HOT HEMOSTASIS (ARGON PLASMA COAGULATION/BICAP);  Surgeon: Lafayette Dragon, MD;  Location: [facility];  Service: Endoscopy;  Laterality: N/A;  . Esophagogastroduodenoscopy N/A 01/18/2013    Procedure: ESOPHAGOGASTRODUODENOSCOPY (EGD);  Surgeon: Lafayette Dragon, MD;  Location: [facility];  Service: Endoscopy;  Laterality: N/A;  . Esophagogastroduodenoscopy N/A 05/24/2013    Procedure: ESOPHAGOGASTRODUODENOSCOPY (EGD);  Surgeon: Jerene Bears, MD;  Location: [place];  Service: Gastroenterology;  Laterality: N/A;  . Hot hemostasis N/A 05/24/2013    Procedure: HOT HEMOSTASIS (ARGON PLASMA COAGULATION/BICAP);  Surgeon: Jerene Bears, MD;  Location: [place];  Service: Gastroenterology;  Laterality: N/A;  . US echocardiography  07/2014    mild LVH, EF 60-65%, normal wall motion, mild AR, mod dilated LA, mildly dilated RA, peak  PA pressure 91mmHg  . Esophagogastroduodenoscopy N/A 10/20/2014    Procedure: ESOPHAGOGASTRODUODENOSCOPY (EGD);  Surgeon: Lafayette Dragon, MD;  Location: [facility];  Service: Endoscopy;  Laterality: N/A;  . Hot hemostasis N/A 10/20/2014    Procedure: HOT HEMOSTASIS (ARGON PLASMA COAGULATION/BICAP);  Surgeon: Lafayette Dragon, MD;  Location: [facility];  Service: Endoscopy;  Laterality: N/A;      Social History History  Substance Use Topics  . Smoking status: Former Smoker -- 1.50 packs/day for 25  years    Types: Cigarettes    Quit date: 08/26/1992  . Smokeless tobacco: Never Used  . Alcohol Use: No     Comment: 3/81/01 "last alcoholic drink was years ago"  Lives at home with husband.  Has in home help.  Needs assistance with ADLs.  Family History Family History  Problem Relation Age of Onset  . Heart disease Mother   . Cervical cancer Mother   . Kidney disease Mother   . Diabetes Mother   . Breast cancer Sister   . Multiple sclerosis Sister   . Colon cancer Neg Hx   . Breast cancer Maternal Aunt     x 2  . Diabetes Father   . Diabetes Sister   . Multiple sclerosis Other   . Heart attack Mother   . Heart attack Father   . Stroke Daughter   . Stroke Daughter     Prior to Admission medications   Medication Sig Start Date End Date Taking? Authorizing Provider  albuterol (PROAIR HFA) 108 (90 BASE) MCG/ACT inhaler Inhale 2 puffs into the lungs every 6 (six) hours as needed for wheezing or shortness of breath.    Yes Historical Provider, MD  ALPRAZolam Duanne Moron) 1 MG tablet TAKE 1 TABLET EVERY 8 HOURS AS NEEDED FOR ANXIETY 01/16/15  Yes Ria Bush, MD  b complex vitamins tablet Take 1 tablet by mouth daily.    Yes Historical Provider, MD  buPROPion (WELLBUTRIN XL) 150 MG 24 hr tablet Take 1 tablet (150 mg total) by mouth daily. 12/19/14  Yes Ria Bush, MD  furosemide (LASIX) 40 MG tablet Take 40-80 mg by mouth 2 (two) times daily. 80 mg in the morning and 40 mg in the  afternoon 03/31/14  Yes Belkys A Regalado, MD  guaiFENesin (MUCINEX) 600 MG 12 hr tablet Take 600 mg by mouth 2 (two) times daily. 2 tablet by mouth once daily   Yes Historical Provider, MD  Insulin Glargine (LANTUS SOLOSTAR) 100 UNIT/ML Solostar Pen Inject 140 Units into the skin every morning. 01/03/15  Yes Renato Shin, MD  insulin lispro (HUMALOG) 100 UNIT/ML KiwkPen INJECT 5-30 UNITS INTO THE SKIN AT 4PM PER SLIDING SCALE (5 for every 50 over 150) 12/19/14  Yes Ria Bush, MD  levalbuterol Penne Lash) 0.63 MG/3ML nebulizer solution Take 0.63 mg by nebulization every 8 (eight) hours as needed for shortness of breath.  06/05/14  Yes Historical Provider, MD  lisinopril (PRINIVIL,ZESTRIL) 5 MG tablet Take 5 mg by mouth every evening.    Yes Historical Provider, MD  loratadine (CLARITIN) 10 MG tablet TAKE 1 TABLET EVERY DAY 10/13/14  Yes Ria Bush, MD  Multiple Vitamins-Minerals (CENTRUM SILVER PO) Take 1 tablet by mouth daily.    Yes Historical Provider, MD  nadolol (CORGARD) 20 MG tablet TAKE 1/2 TABLET BY MOUTH DAILY 11/25/14  Yes Sherren Mocha, MD  NON FORMULARY 3 liters oxygen 24/7   Yes Historical Provider, MD  ONE TOUCH LANCETS MISC Use to check sugar 4 times daily and as needed. BP:Z02.58 **ONE TOUCH DELICA** 12/26/75  Yes Ria Bush, MD  oxyCODONE (OXY IR/ROXICODONE) 5 MG immediate release tablet Take one tablet by mouth every 4 hours as needed for pain 12/05/14  Yes Ria Bush, MD  pantoprazole (PROTONIX) 40 MG tablet Take 1 tablet (40 mg total) by mouth 2 (two) times daily. 12/05/14  Yes Ria Bush, MD  polyethylene glycol [facility] / GLYCOLAX) packet Take 17 g by mouth daily as needed for moderate constipation.   Yes Historical  Provider, MD  PRISTIQ 50 MG 24 hr tablet TAKE 3 TABLETS BY MOUTH EVERY DAY 11/17/14  Yes Ria Bush, MD  promethazine (PHENERGAN) 25 MG tablet Take 1 tablet (25 mg total) by mouth 2 (two) times daily as needed for nausea. 06/09/14  Yes Ria Bush, MD  QUEtiapine (SEROQUEL) 200 MG tablet Take 1 tablet (200 mg total) by mouth at bedtime. 12/05/14  Yes Ria Bush, MD  rOPINIRole (REQUIP) 4 MG tablet Take 1 tablet (4 mg total) by mouth at bedtime. 01/16/15  Yes Ria Bush, MD  Sal[facility] NASAL SPRAY NA) Place 1 spray into the nose 3 (three) times daily as needed (congestion).    Yes Historical Provider, MD  simvastatin (ZOCOR) 40 MG tablet TAKE 1 TABLET BY MOUTH EVERY EVENING 11/23/14  Yes Ria Bush, MD  spironolactone (ALDACTONE) 25 MG tablet TAKE 1/2 TABLET BY MOUTH DAILY 12/19/14  Yes Sherren Mocha, MD  Vitamin D, Ergocalciferol, (DRISDOL) 50000 UNITS CAPS capsule TAKE 1 CAPSULE (50,000 UNITS TOTAL) BY MOUTH EVERY 7 (SEVEN) DAYS. TAKE ON FRIDAY 05/16/14  Yes Ria Bush, MD  XIFAXAN 550 MG TABS tablet TAKE 1 TABLET (550 MG TOTAL) BY MOUTH DAILY. 01/12/15  Yes Lafayette Dragon, MD  darbepoetin (ARANESP) 200 MCG/0.4ML SOLN injection Inject 200 mcg into the skin every 14 (fourteen) days.    Historical Provider, MD  lactulose, encephalopathy, (CHRONULAC) 10 GM/15ML SOLN Take 30 mLs (20 g total) by mouth 2 (two) times daily. constipation 07/14/14   Lori P Hvozdovic, PA-C  nitroGLYCERIN (NITROSTAT) 0.4 MG SL tablet Place 1 tablet (0.4 mg total) under the tongue every 5 (five) minutes as needed for chest pain. Patient not taking: Reported on 01/05/2015 11/07/14   Liliane Shi, PA-C    Allergies  Allergen Reactions  . Acetaminophen Other (See Comments)    Liver dysfunction  . Aspirin Other (See Comments)    Causes nosebleeds  . Theophylline Nausea And Vomiting  . Penicillins Itching    Physical Exam  Vitals  Blood pressure 148/40, pulse 58, temperature 98.4 F (36.9 C), temperature source Oral, resp. rate 11, SpO2 99 %.   General: Elderly, obese female lying in bed. Anxious. Sitting up leaning forward. She does not stop moving during her exam. Sister at bedside.   Psych: Slightly anxious, but cooperative  and appropriate  Neuro:   No F.N deficits, ALL C.Nerves Intact, Strength 5/5 all 4 extremities, Sensation intact all 4 extremities.  ENT:  Ears and Eyes appear Normal, Conjunctivae clear, PER. Moist oral mucosa without erythema or exudates.  Neck:  Supple, No lymphadenopathy appreciated  Respiratory:  Breath sounds are decreased (tight). Patient has mild to moderate increased work of breathing.  Cardiac:  RRR, No Murmurs, no LE edema noted, no JVD.    Abdomen: Obese, nontender, nondistended, positive bowel sounds.  Skin:  No Cyanosis, Normal Skin Turgor, No Skin Rash or Bruise.  Extremities:  Able to move all 4. 5/5 strength in each,  no effusions.  Data Review  CBC  Recent Labs Lab 01/20/15 1329 01/25/15 0817  WBC  --  3.8*  HGB 8.5* 8.7*  HCT  --  26.3*  PLT  --  108*  MCV  --  99.6  MCH  --  33.0  MCHC  --  33.1  RDW  --  15.5  LYMPHSABS  --  0.4*  MONOABS  --  0.2  EOSABS  --  0.2  BASOSABS  --  0.0    Chemistries  Recent Labs Lab 01/25/15 0817  NA 135  K 4.6  CL 99*  CO2 26  GLUCOSE 323*  BUN 74*  CREATININE 1.61*  CALCIUM 9.6  AST 30  ALT 27  ALKPHOS 99  BILITOT 1.3*     Cardiac Enzymes  Recent Labs Lab 01/25/15 0817  TROPONINI <0.03     Urinalysis    Component Value Date/Time   COLORURINE YELLOW 01/25/2015 0916   APPEARANCEUR CLEAR 01/25/2015 0916   LABSPEC 1.013 01/25/2015 0916   PHURINE 5.5 01/25/2015 0916   GLUCOSEU 500* 01/25/2015 0916   GLUCOSEU NEGATIVE 05/31/2013 1627   HGBUR NEGATIVE 01/25/2015 0916   BILIRUBINUR NEGATIVE 01/25/2015 0916   BILIRUBINUR negative 01/02/2015 1433   KETONESUR NEGATIVE 01/25/2015 0916   PROTEINUR NEGATIVE 01/25/2015 0916   PROTEINUR negative 01/02/2015 1433   UROBILINOGEN 0.2 01/25/2015 0916   UROBILINOGEN 4.0 01/02/2015 1433   NITRITE POSITIVE* 01/25/2015 0916   NITRITE negative 01/02/2015 1433   LEUKOCYTESUR TRACE* 01/25/2015 0916    Imaging results:   Dg Chest 2 View  01/25/2015    CLINICAL DATA:  Progressive shortness of breath  EXAM: CHEST  2 VIEW  COMPARISON:  December 05, 2014  FINDINGS: There is stable scarring in the left mid lung as well as in the right perihilar regions. There is no edema or consolidation. No new opacity. Heart is upper normal in size with pulmonary vascularity within normal limits. No adenopathy. No bone lesions.  IMPRESSION: Stable areas of scarring. No edema or consolidation. No change in cardiac silhouette.   Electronically Signed   By: Lowella Grip III M.D.   On: 01/25/2015 09:11    My personal review of EKG: Sinus rhythm.  Borderline ST elevation and depression.  Normal QT   Assessment & Plan  Principal Problem:   Chronic obstructive pulmonary disease with acute exacerbation Active Problems:   Aortic valve disorder   GERD   Autoimmune hepatitis   Other pancytopenia   Chronic diastolic CHF (congestive heart failure)   CKD stage 3 due to type 2 diabetes mellitus   Chronic respiratory failure   Acute cystitis without hematuria   UTI (urinary tract infection)   COPD exacerbation   COPD exacerbation Patient w/ dyspnea requiring additional oxygen Will give duonebs, pulmicort, Doxycycline, Solumedrol, fluttervalve. Patient on 3L of oxygen at home but no COPD maintenance meds (other than nebs)  UTI Recurrent UTIs.   Given Fosfomycin in the ER.  Urine culture ordered. Would check culture on 6/2 to determine what antibiotic if any is needed. Question whether or not patient may have yeast in her urine given multiple antibiotics recently.  Pancytopenia Likely secondary to NASH and CKD.  Stable.  CKD  Stable.  Creatinine at baseline.  NASH Will continue Lactulose BID, Diuretics, and Nadolol (Variceal bleed prophylaxis) Patient states she has chronic Black Stools.  Will avoid Heparin / Lovenox. Daily weights, Low salt diet.  Diastolic CHF Echo:  LVEF 57/26/20 60 - 65%. Mild LVH Appears euvolemic. Continue diuretics.  Daily  weights, low salt diet.  Diabetes Mellitus On Sliding scale lantus (?) 80 - 140 units in am.  Will give 100. Plus SSI and meal coverage. Check Hgb A1C  Abnormal EKG ?ST elevation - ST depression. Troponin WNL.  Patient not complaining of CP. Will repeat EKG.  Anxiety Likely contributed to Dyspnea this morning. Continue psychotropics. Continue benzodiazepines at decreased dose. Question whether or not patient needs more support at home. Lives with husband, but he works. (assisted living/SNF?)  Consultants Called:  none  Family Communication:   Sister at bedside.  Code Status:  Full  Condition:  Guarded.  Potential Disposition:   Time spent in minutes : [place],  PA-C on 01/25/2015 at 5:44 PM Between 7am to 7pm - Pager - [phone] After 7pm go to [email or web address] - password [email or web address] And look for the night coverage person covering me after hours  [facility]

## 2015-01-25 NOTE — ED Notes (Signed)
Assisted patient on to and off of bedside commode. Collected and measured urine output. 374ml.[name]

## 2015-01-25 NOTE — ED Notes (Signed)
Patient very anxious after admitting team left.  See MAR for orders.  Family sitting with patient.

## 2015-01-25 NOTE — ED Notes (Signed)
Dr. Vanita Panda at bedside with patient and family.

## 2015-01-25 NOTE — Progress Notes (Signed)
Cardiology Office Note   Date:  01/25/2015   ID:  [name], DOB 1941-03-18, MRN [id number]  PCP:  Ria Bush, MD  Cardiologist:  Dr. Sherren Mocha     No chief complaint on file.    History of Present Illness: [name] is a 74 y.o. 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 The patient has had a significant problem with worsening anemia. She is followed closely by gastroenterology. She has required periodic iron infusions as well as Aranesp injections every 2 weeks to maintain her hemoglobin.  She was admitted 04/2013 with worsening anemia and transfused with PRBCs. EGD demonstrated GAVE which was treated with APC. She again underwent EGD and APC ablation of AVMs 09/2014. I saw her last in 3/16.  She returns for FU.     Studies: - LHC 11/2007: Dx 20%, proximal and mid RCA 20-30%, EF 50-55%.  - Echo 01/2013: EF 55-60%, Gr 2 DD, very mild AS (mean 9 mmHg), mild AI, mild BAE, PASP 39.  Echo 08/14/14 - Mild LVH. EF 60-65%.  Wall motion was normal  - Aortic valve: There was mild regurgitation. - Mitral valve: Calcified annulus. - Left atrium: The atrium was moderately dilated. - Right atrium: The atrium was mildly dilated. - PA peak pressure: 39 mm Hg (S).   Past Medical History  Diagnosis Date  . Chronic airway obstruction, not elsewhere classified   . Unspecified chronic bronchitis   . Unspecified essential hypertension   . Non-obstructive CAD   . Palpitations   . Mild aortic stenosis     a. 03/2014 Valve area (VTI): 2.89 cm^2, Valve area (Vmax): 2.7 cm^2.  . Pure hypercholesterolemia   . Morbid obesity   . Esophageal reflux   . GAVE (gastric antral vascular ectasia)     angiodysplasia  . Diverticulosis of colon (without mention of hemorrhage)   . Benign neoplasm of colon     . Autoimmune hepatitis   . Osteoarthrosis, unspecified whether generalized or localized, unspecified site   . Osteoporosis, unspecified   . Restless legs syndrome (RLS)   . Major depressive disorder, recurrent episode, severe, specified as with psychotic behavior   . Iron deficiency anemia secondary to blood loss (chronic)     a. frequent PRBC transfusions.  . Coarse tremors     a. arms.  . Carpal tunnel syndrome on both sides   . Palpitations   . Chronic diastolic CHF (congestive heart failure)     a. 03/26/2014 Echo: EF 55-60%, no rwma, mild AS/AI, mod-sev Ca2+ MV annulus, mildly to mod dil LA.  [name] Asthma   . OSA (obstructive sleep apnea)     a. does not use CPAP.  [name] Type II diabetes mellitus   . H/O hiatal hernia   . 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 dx'd 1990's  . Adenomatous colon polyp   . Midsternal chest pain     a. conservatively managed ->poor candidate for cath/anticoagulation.  [name] Anxiety   . Portal hypertensive gastropathy   . Falls frequently     completed HHPT/OT 06/2014, PT unmet goals  . Recurrent UTI (urinary tract infection)     h/o hospitalization with urosepsis 2015, but thought large component colonization/bacteriuria, only treat if symptomatic ([place])  . Hiatal hernia   . Nonalcoholic steatohepatitis (NASH)   .  Thrombocytopenia   . Protein calorie malnutrition   . Family history of adverse reaction to anesthesia     Mother and sister were hard to wake up  . History of pneumonia     CAP 2015    Past Surgical History  Procedure Laterality Date  . Hemorroidectomy    . Appendectomy    . Vaginal hysterectomy    . Breast biopsy      bilateral  . Cesarean section      x 3  . Appendectomy    . Esophagogastroduodenoscopy  06/12/2012    Procedure: ESOPHAGOGASTRODUODENOSCOPY (EGD);  Surgeon: Lafayette Dragon, MD;  Location: [facility];  Service: Endoscopy;  Laterality: N/A;  . Balloon dilation  06/12/2012     Procedure: BALLOON DILATION;  Surgeon: Lafayette Dragon, MD;  Location: [facility];  Service: Endoscopy;  Laterality: N/A;  ?balloon  . Esophagogastroduodenoscopy  09/10/2012    Procedure: ESOPHAGOGASTRODUODENOSCOPY (EGD);  Surgeon: Lafayette Dragon, MD;  Location: [facility];  Service: Endoscopy;  Laterality: N/A;  . Hot hemostasis  09/10/2012    Procedure: HOT HEMOSTASIS (ARGON PLASMA COAGULATION/BICAP);  Surgeon: Lafayette Dragon, MD;  Location: [facility];  Service: Endoscopy;  Laterality: N/A;  . Esophagogastroduodenoscopy N/A 01/18/2013    Procedure: ESOPHAGOGASTRODUODENOSCOPY (EGD);  Surgeon: Lafayette Dragon, MD;  Location: [facility];  Service: Endoscopy;  Laterality: N/A;  . Esophagogastroduodenoscopy N/A 05/24/2013    Procedure: ESOPHAGOGASTRODUODENOSCOPY (EGD);  Surgeon: Jerene Bears, MD;  Location: [facility];  Service: Gastroenterology;  Laterality: N/A;  . Hot hemostasis N/A 05/24/2013    Procedure: HOT HEMOSTASIS (ARGON PLASMA COAGULATION/BICAP);  Surgeon: Jerene Bears, MD;  Location: [facility];  Service: Gastroenterology;  Laterality: N/A;  . US echocardiography  07/2014    mild LVH, EF 60-65%, normal wall motion, mild AR, mod dilated LA, mildly dilated RA, peak PA pressure 53mmHg  . Esophagogastroduodenoscopy N/A 10/20/2014    Procedure: ESOPHAGOGASTRODUODENOSCOPY (EGD);  Surgeon: Lafayette Dragon, MD;  Location: [facility];  Service: Endoscopy;  Laterality: N/A;  . Hot hemostasis N/A 10/20/2014    Procedure: HOT HEMOSTASIS (ARGON PLASMA COAGULATION/BICAP);  Surgeon: Lafayette Dragon, MD;  Location: [facility];  Service: Endoscopy;  Laterality: N/A;     Current Outpatient Prescriptions  Medication Sig Dispense Refill  . albuterol (PROAIR HFA) 108 (90 BASE) MCG/ACT inhaler Inhale 2 puffs into the lungs every 6 (six) hours as needed for wheezing or shortness of breath.     . ALPRAZolam (XANAX) 1 MG tablet TAKE 1 TABLET EVERY 8 HOURS AS NEEDED FOR ANXIETY 40 tablet 0  . azithromycin  (ZITHROMAX Z-PAK) 250 MG tablet Two on day 1 followed by one daily for 4 days for total of 5 days, PO 6 tablet 0  . b complex vitamins tablet Take 1 tablet by mouth daily.     [name] buPROPion (WELLBUTRIN XL) 150 MG 24 hr tablet Take 1 tablet (150 mg total) by mouth daily. 90 tablet 3  . darbepoetin (ARANESP) 200 MCG/0.4ML SOLN injection Inject 200 mcg into the skin every 14 (fourteen) days.    . diphenhydrAMINE-zinc acetate (BENADRYL) cream Apply topically 3 (three) times daily as needed for itching. 28.4 g 0  . furosemide (LASIX) 40 MG tablet Take 80 mg by mouth 2 (two) times daily.     [name] glucose blood test strip 1 each by Other route as directed. Use to check sugar 4 times daily and as needed Dx: E10.40 **ONE TOUCH ULTRA** 200 each 3  . guaiFENesin (  MUCINEX) 600 MG 12 hr tablet Take 600 mg by mouth 2 (two) times daily. 2 tablet by mouth once daily    . Insulin Glargine (LANTUS SOLOSTAR) 100 UNIT/ML Solostar Pen Inject 140 Units into the skin every morning. 20 pen 11  . insulin lispro (HUMALOG) 100 UNIT/ML KiwkPen INJECT 5-30 UNITS INTO THE SKIN AT 4PM PER SLIDING SCALE (5 for every 50 over 150)    . Insulin Pen Needle (B-D ULTRAFINE III SHORT PEN) 31G X 8 MM MISC Use to inject insulin 2 times daily as instructed. 200 each 2  . lactulose, encephalopathy, (CHRONULAC) 10 GM/15ML SOLN Take 30 mLs (20 g total) by mouth 2 (two) times daily. constipation 2700 mL 6  . levalbuterol (XOPENEX) 0.63 MG/3ML nebulizer solution Take 0.63 mg by nebulization every 8 (eight) hours as needed for shortness of breath.   5  . lisinopril (PRINIVIL,ZESTRIL) 5 MG tablet Take 5 mg by mouth every evening.     . loratadine (CLARITIN) 10 MG tablet TAKE 1 TABLET EVERY DAY 30 tablet 3  . Multiple Vitamins-Minerals (CENTRUM SILVER PO) Take 1 tablet by mouth daily.     . mupirocin cream (BACTROBAN) 2 % APPLY 1 APPLICATION TOPICALLY 2 (TWO) TIMES DAILY. FOR SKIN LESIONS 30 g 0  . nadolol (CORGARD) 20 MG tablet TAKE 1/2 TABLET BY MOUTH  DAILY 15 tablet 1  . nitroGLYCERIN (NITROSTAT) 0.4 MG SL tablet Place 1 tablet (0.4 mg total) under the tongue every 5 (five) minutes as needed for chest pain. (Patient not taking: Reported on 01/05/2015) 25 tablet 3  . NON FORMULARY 3 liters oxygen 24/7    . ONE TOUCH LANCETS MISC Use to check sugar 4 times daily and as needed. IO:E70.35 **ONE TOUCH DELICA** 009 each 2  . oxyCODONE (OXY IR/ROXICODONE) 5 MG immediate release tablet Take one tablet by mouth every 4 hours as needed for pain 60 tablet 0  . pantoprazole (PROTONIX) 40 MG tablet Take 1 tablet (40 mg total) by mouth 2 (two) times daily. 60 tablet 6  . polyethylene glycol (MIRALAX / GLYCOLAX) packet Take 17 g by mouth daily as needed for moderate constipation.    [name] PRISTIQ 50 MG 24 hr tablet TAKE 3 TABLETS BY MOUTH EVERY DAY 90 tablet 1  . promethazine (PHENERGAN) 25 MG tablet Take 1 tablet (25 mg total) by mouth 2 (two) times daily as needed for nausea. 30 tablet 3  . QUEtiapine (SEROQUEL) 200 MG tablet Take 1 tablet (200 mg total) by mouth at bedtime. 30 tablet 6  . rOPINIRole (REQUIP) 4 MG tablet Take 1 tablet (4 mg total) by mouth at bedtime. 30 tablet 11  . Saline (OCEAN NASAL SPRAY NA) Place 1 spray into the nose 3 (three) times daily as needed (congestion).     . simvastatin (ZOCOR) 40 MG tablet TAKE 1 TABLET BY MOUTH EVERY EVENING 30 tablet 3  . spironolactone (ALDACTONE) 25 MG tablet TAKE 1/2 TABLET BY MOUTH DAILY 15 tablet 1  . Vitamin D, Ergocalciferol, (DRISDOL) 50000 UNITS CAPS capsule TAKE 1 CAPSULE (50,000 UNITS TOTAL) BY MOUTH EVERY 7 (SEVEN) DAYS. TAKE ON FRIDAY 5 capsule 3  . XIFAXAN 550 MG TABS tablet TAKE 1 TABLET (550 MG TOTAL) BY MOUTH DAILY. 30 tablet 1   No current facility-administered medications for this visit.    Allergies:   Acetaminophen; Aspirin; Theophylline; and Penicillins    Social History:  The patient  reports that she quit smoking about 22 years ago. Her smoking use included Cigarettes.  She has a  37.5 pack-year smoking history. She has never used smokeless tobacco. She reports that she does not drink alcohol or use illicit drugs.   Family History:  The patient's family history includes Breast cancer in her maternal aunt and sister; Cervical cancer in her mother; Diabetes in her father, mother, and sister; Heart attack in her father and mother; Heart disease in her mother; Kidney disease in her mother; Multiple sclerosis in her other and sister; Stroke in her daughter and daughter. There is no history of Colon cancer.    ROS:   Please see the history of present illness.   ROS    PHYSICAL EXAM: VS:  There were no vitals taken for this visit.    Wt Readings from Last 3 Encounters:  01/16/15 220 lb 8 oz (100.018 kg)  01/05/15 219 lb (99.338 kg)  01/02/15 222 lb 6.4 oz (100.88 kg)     GEN: Well nourished, well developed, in no acute distress HEENT: normal Neck: no JVD at 90 degrees, no masses Cardiac:  Normal S1/S2, RRR; 3-8/3 systolic murmur RUSB, no rubs or gallops, trace edema  Respiratory:  Decreased breath sounds bilaterally, no wheezing, rhonchi or rales. GI: soft, nontender, nondistended, + BS MS: no deformity or atrophy Skin: warm and dry  Neuro:  CNs II-XII intact, Strength and sensation are intact Psych: Normal affect   EKG:  EKG is ordered today.  It demonstrates:     Recent Labs: 08/11/2014: Pro B Natriuretic peptide (BNP) 226.4*; TSH 1.970 08/12/2014: ALT 26 08/29/2014: B Natriuretic Peptide 233.0* 12/13/2014: BUN 80*; Creatinine 1.76*; Potassium 4.3; Sodium 137 01/06/2015: Platelets 65* 01/20/2015: Hemoglobin 8.5*    Lipid Panel    Component Value Date/Time   CHOL 115 12/13/2014 0902   TRIG 113.0 12/13/2014 0902   HDL 40.50 12/13/2014 0902   CHOLHDL 3 12/13/2014 0902   VLDL 22.6 12/13/2014 0902   LDLCALC 52 12/13/2014 0902      ASSESSMENT AND PLAN:  Chronic diastolic CHF (congestive heart failure):    Shortness of breath:  Multifactorial and  related to COPD, CHF, chronic anemia.  Essential hypertension:  Borderline control. Continue to monitor.  Anemia, iron deficiency:  Secondary to GAVE (gastric antral vascular ectasia).  FU with GI.  COPD:    Current medicines are reviewed at length with the patient today.  Any concerns are as outlined above. The following changes have been made:        Labs/ tests ordered today include:   No orders of the defined types were placed in this encounter.    Disposition:   FU   Signed, Richardson Dopp, PA-C, MHS 01/25/2015 8:01 AM    [facility] [address], [place], [place]  [address] Phone: [phone]; Fax: [phone]    This encounter was created in error - please disregard.

## 2015-01-25 NOTE — ED Provider Notes (Signed)
CSN: [id number]     Arrival date & time 01/25/15  2130 History   First MD Initiated Contact with Patient 01/25/15 [phone]     Chief Complaint  Patient presents with  . Shortness of Breath     (Consider location/radiation/quality/duration/timing/severity/associated sxs/prior Treatment) HPI Patient presents with concern of dyspnea. Patient has baseline difficulty breathing, with 3 L oxygen 24/7. About 4 days ago she developed worsening dyspnea, without new focal pain. She does have ongoing general discomfort, but specifically denies chest pain, headache, belly pain. Symptoms are worse with exertion. No appreciable weight gain, edema, fever, chills. Patient is completing course of anti-biotics for possible URI. She denies any current productive cough. No relief with typical home inhaled medication, and the patient has not increased her home oxygen use at all. Past Medical History  Diagnosis Date  . Chronic airway obstruction, not elsewhere classified   . Unspecified chronic bronchitis   . Unspecified essential hypertension   . Non-obstructive CAD   . Palpitations   . Mild aortic stenosis     a. 03/2014 Valve area (VTI): 2.89 cm^2, Valve area (Vmax): 2.7 cm^2.  . Pure hypercholesterolemia   . Morbid obesity   . Esophageal reflux   . GAVE (gastric antral vascular ectasia)     angiodysplasia  . Diverticulosis of colon (without mention of hemorrhage)   . Benign neoplasm of colon   . Autoimmune hepatitis   . Osteoarthrosis, unspecified whether generalized or localized, unspecified site   . Osteoporosis, unspecified   . Restless legs syndrome (RLS)   . Major depressive disorder, recurrent episode, severe, specified as with psychotic behavior   . Iron deficiency anemia secondary to blood loss (chronic)     a. frequent PRBC transfusions.  . Coarse tremors     a. arms.  . Carpal tunnel syndrome on both sides   . Palpitations   . Chronic diastolic CHF (congestive heart failure)     a.  03/26/2014 Echo: EF 55-60%, no rwma, mild AS/AI, mod-sev Ca2+ MV annulus, mildly to mod dil LA.  [name] Asthma   . OSA (obstructive sleep apnea)     a. does not use CPAP.  [name] Type II diabetes mellitus   . H/O hiatal hernia   . 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 dx'd 1990's  . Adenomatous colon polyp   . Midsternal chest pain     a. conservatively managed ->poor candidate for cath/anticoagulation.  [name] Anxiety   . Portal hypertensive gastropathy   . Falls frequently     completed HHPT/OT 06/2014, PT unmet goals  . Recurrent UTI (urinary tract infection)     h/o hospitalization with urosepsis 2015, but thought large component colonization/bacteriuria, only treat if symptomatic ([place])  . Hiatal hernia   . Nonalcoholic steatohepatitis (NASH)   . Thrombocytopenia   . Protein calorie malnutrition   . Family history of adverse reaction to anesthesia     Mother and sister were hard to wake up  . History of pneumonia     CAP 2015   Past Surgical History  Procedure Laterality Date  . Hemorroidectomy    . Appendectomy    . Vaginal hysterectomy    . Breast biopsy      bilateral  . Cesarean section      x 3  . Appendectomy    . Esophagogastroduodenoscopy  06/12/2012    Procedure: ESOPHAGOGASTRODUODENOSCOPY (EGD);  Surgeon: Lafayette Dragon, MD;  Location: [facility];  Service: Endoscopy;  Laterality: N/A;  . Balloon dilation  06/12/2012    Procedure: BALLOON DILATION;  Surgeon: Lafayette Dragon, MD;  Location: [facility];  Service: Endoscopy;  Laterality: N/A;  ?balloon  . Esophagogastroduodenoscopy  09/10/2012    Procedure: ESOPHAGOGASTRODUODENOSCOPY (EGD);  Surgeon: Lafayette Dragon, MD;  Location: [facility];  Service: Endoscopy;  Laterality: N/A;  . Hot hemostasis  09/10/2012    Procedure: HOT HEMOSTASIS (ARGON PLASMA COAGULATION/BICAP);  Surgeon: Lafayette Dragon, MD;  Location: [facility];  Service: Endoscopy;  Laterality: N/A;  . Esophagogastroduodenoscopy  N/A 01/18/2013    Procedure: ESOPHAGOGASTRODUODENOSCOPY (EGD);  Surgeon: Lafayette Dragon, MD;  Location: [facility];  Service: Endoscopy;  Laterality: N/A;  . Esophagogastroduodenoscopy N/A 05/24/2013    Procedure: ESOPHAGOGASTRODUODENOSCOPY (EGD);  Surgeon: Jerene Bears, MD;  Location: [place];  Service: Gastroenterology;  Laterality: N/A;  . Hot hemostasis N/A 05/24/2013    Procedure: HOT HEMOSTASIS (ARGON PLASMA COAGULATION/BICAP);  Surgeon: Jerene Bears, MD;  Location: [place];  Service: Gastroenterology;  Laterality: N/A;  . US echocardiography  07/2014    mild LVH, EF 60-65%, normal wall motion, mild AR, mod dilated LA, mildly dilated RA, peak PA pressure 58mmHg  . Esophagogastroduodenoscopy N/A 10/20/2014    Procedure: ESOPHAGOGASTRODUODENOSCOPY (EGD);  Surgeon: Lafayette Dragon, MD;  Location: [facility];  Service: Endoscopy;  Laterality: N/A;  . Hot hemostasis N/A 10/20/2014    Procedure: HOT HEMOSTASIS (ARGON PLASMA COAGULATION/BICAP);  Surgeon: Lafayette Dragon, MD;  Location: [facility];  Service: Endoscopy;  Laterality: N/A;   Family History  Problem Relation Age of Onset  . Heart disease Mother   . Cervical cancer Mother   . Kidney disease Mother   . Diabetes Mother   . Breast cancer Sister   . Multiple sclerosis Sister   . Colon cancer Neg Hx   . Breast cancer Maternal Aunt     x 2  . Diabetes Father   . Diabetes Sister   . Multiple sclerosis Other   . Heart attack Mother   . Heart attack Father   . Stroke Daughter   . Stroke Daughter    History  Substance Use Topics  . Smoking status: Former Smoker -- 1.50 packs/day for 25 years    Types: Cigarettes    Quit date: 08/26/1992  . Smokeless tobacco: Never Used  . Alcohol Use: No     Comment: 03/04/61 "last alcoholic drink was years ago"   OB History    No data available     Review of Systems  Constitutional:       Per HPI, otherwise negative  HENT:       Per HPI, otherwise negative  Respiratory:        Per HPI, otherwise negative  Cardiovascular:       Per HPI, otherwise negative  Gastrointestinal: Negative for vomiting.  Endocrine:       Negative aside from HPI  Genitourinary:       Neg aside from HPI   Musculoskeletal:       Per HPI, otherwise negative  Skin: Negative.   Neurological: Negative for syncope.      Allergies  Acetaminophen; Aspirin; Theophylline; and Penicillins  Home Medications   Prior to Admission medications   Medication Sig Start Date End Date Taking? Authorizing Provider  albuterol (PROAIR HFA) 108 (90 BASE) MCG/ACT inhaler Inhale 2 puffs into the lungs every 6 (six) hours as needed for wheezing or shortness of breath.    Yes Historical Provider, MD  ALPRAZolam (XANAX) 1 MG tablet TAKE 1 TABLET EVERY 8 HOURS AS NEEDED FOR ANXIETY 01/16/15  Yes Ria Bush, MD  b complex vitamins tablet Take 1 tablet by mouth daily.    Yes Historical Provider, MD  buPROPion (WELLBUTRIN XL) 150 MG 24 hr tablet Take 1 tablet (150 mg total) by mouth daily. 12/19/14  Yes Ria Bush, MD  furosemide (LASIX) 40 MG tablet Take 40-80 mg by mouth 2 (two) times daily. 80 mg in the morning and 40 mg in the afternoon 03/31/14  Yes Belkys A Regalado, MD  guaiFENesin (MUCINEX) 600 MG 12 hr tablet Take 600 mg by mouth 2 (two) times daily. 2 tablet by mouth once daily   Yes Historical Provider, MD  Insulin Glargine (LANTUS SOLOSTAR) 100 UNIT/ML Solostar Pen Inject 140 Units into the skin every morning. 01/03/15  Yes Renato Shin, MD  insulin lispro (HUMALOG) 100 UNIT/ML KiwkPen INJECT 5-30 UNITS INTO THE SKIN AT 4PM PER SLIDING SCALE (5 for every 50 over 150) 12/19/14  Yes Ria Bush, MD  levalbuterol Penne Lash) 0.63 MG/3ML nebulizer solution Take 0.63 mg by nebulization every 8 (eight) hours as needed for shortness of breath.  06/05/14  Yes Historical Provider, MD  lisinopril (PRINIVIL,ZESTRIL) 5 MG tablet Take 5 mg by mouth every evening.    Yes Historical Provider, MD  loratadine  (CLARITIN) 10 MG tablet TAKE 1 TABLET EVERY DAY 10/13/14  Yes Ria Bush, MD  Multiple Vitamins-Minerals (CENTRUM SILVER PO) Take 1 tablet by mouth daily.    Yes Historical Provider, MD  nadolol (CORGARD) 20 MG tablet TAKE 1/2 TABLET BY MOUTH DAILY 11/25/14  Yes Sherren Mocha, MD  NON FORMULARY 3 liters oxygen 24/7   Yes Historical Provider, MD  ONE TOUCH LANCETS MISC Use to check sugar 4 times daily and as needed. ON:G29.52 **ONE TOUCH DELICA** 03/29/12  Yes Ria Bush, MD  oxyCODONE (OXY IR/ROXICODONE) 5 MG immediate release tablet Take one tablet by mouth every 4 hours as needed for pain 12/05/14  Yes Ria Bush, MD  pantoprazole (PROTONIX) 40 MG tablet Take 1 tablet (40 mg total) by mouth 2 (two) times daily. 12/05/14  Yes Ria Bush, MD  polyethylene glycol [name] / GLYCOLAX) packet Take 17 g by mouth daily as needed for moderate constipation.   Yes Historical Provider, MD  PRISTIQ 50 MG 24 hr tablet TAKE 3 TABLETS BY MOUTH EVERY DAY 11/17/14  Yes Ria Bush, MD  promethazine (PHENERGAN) 25 MG tablet Take 1 tablet (25 mg total) by mouth 2 (two) times daily as needed for nausea. 06/09/14  Yes Ria Bush, MD  QUEtiapine (SEROQUEL) 200 MG tablet Take 1 tablet (200 mg total) by mouth at bedtime. 12/05/14  Yes Ria Bush, MD  rOPINIRole (REQUIP) 4 MG tablet Take 1 tablet (4 mg total) by mouth at bedtime. 01/16/15  Yes Ria Bush, MD  [facility] NASAL SPRAY NA) Place 1 spray into the nose 3 (three) times daily as needed (congestion).    Yes Historical Provider, MD  simvastatin (ZOCOR) 40 MG tablet TAKE 1 TABLET BY MOUTH EVERY EVENING 11/23/14  Yes Ria Bush, MD  spironolactone (ALDACTONE) 25 MG tablet TAKE 1/2 TABLET BY MOUTH DAILY 12/19/14  Yes Sherren Mocha, MD  Vitamin D, Ergocalciferol, (DRISDOL) 50000 UNITS CAPS capsule TAKE 1 CAPSULE (50,000 UNITS TOTAL) BY MOUTH EVERY 7 (SEVEN) DAYS. TAKE ON FRIDAY 05/16/14  Yes Ria Bush, MD  XIFAXAN 550  MG TABS tablet TAKE 1 TABLET (550 MG TOTAL) BY MOUTH DAILY. 01/12/15  Yes Dora  Andris Baumann, MD  darbepoetin (ARANESP) 200 MCG/0.4ML SOLN injection Inject 200 mcg into the skin every 14 (fourteen) days.    Historical Provider, MD  lactulose, encephalopathy, (CHRONULAC) 10 GM/15ML SOLN Take 30 mLs (20 g total) by mouth 2 (two) times daily. constipation 07/14/14   Lori P Hvozdovic, PA-C  nitroGLYCERIN (NITROSTAT) 0.4 MG SL tablet Place 1 tablet (0.4 mg total) under the tongue every 5 (five) minutes as needed for chest pain. Patient not taking: Reported on 01/05/2015 11/07/14   Liliane Shi, PA-C   BP 135/60 mmHg  Pulse 58  Temp(Src) 99.2 F (37.3 C) (Oral)  Resp 20  SpO2 100% Physical Exam  Constitutional: She is oriented to person, place, and time. She appears well-developed and well-nourished. She has a sickly appearance. No distress.  HENT:  Head: Normocephalic and atraumatic.  Very poor dentition  Eyes: Conjunctivae and EOM are normal.  Cardiovascular: Normal rate and regular rhythm.   Pulmonary/Chest: No stridor. She has decreased breath sounds.  Abdominal: She exhibits no distension.  Musculoskeletal: She exhibits no edema.  Neurological: She is alert and oriented to person, place, and time. No cranial nerve deficit.  Skin: Skin is warm and dry.  Psychiatric: She has a normal mood and affect.  Nursing note and vitals reviewed.   ED Course  Procedures (including critical care time) Labs Review Labs Reviewed  COMPREHENSIVE METABOLIC PANEL - Abnormal; Notable for the following:    Chloride 99 (*)    Glucose, Bld 323 (*)    BUN 74 (*)    Creatinine, Ser 1.61 (*)    Total Protein 6.3 (*)    Albumin 3.3 (*)    Total Bilirubin 1.3 (*)    GFR calc non Af Amer 31 (*)    GFR calc Af Amer 36 (*)    All other components within normal limits  CBC WITH DIFFERENTIAL/PLATELET - Abnormal; Notable for the following:    WBC 3.8 (*)    RBC 2.64 (*)    Hemoglobin 8.7 (*)    HCT 26.3 (*)     Platelets 108 (*)    Neutrophils Relative % 78 (*)    Lymphocytes Relative 10 (*)    Lymphs Abs 0.4 (*)    All other components within normal limits  URINALYSIS, ROUTINE W REFLEX MICROSCOPIC (NOT AT [facility]) - Abnormal; Notable for the following:    Glucose, UA 500 (*)    Nitrite POSITIVE (*)    Leukocytes, UA TRACE (*)    All other components within normal limits  URINE MICROSCOPIC-ADD ON - Abnormal; Notable for the following:    Bacteria, UA MANY (*)    All other components within normal limits  URINE CULTURE  TROPONIN I  BRAIN NATRIURETIC PEPTIDE    Imaging Review Dg Chest 2 View  01/25/2015   CLINICAL DATA:  Progressive shortness of breath  EXAM: CHEST  2 VIEW  COMPARISON:  December 05, 2014  FINDINGS: There is stable scarring in the left mid lung as well as in the right perihilar regions. There is no edema or consolidation. No new opacity. Heart is upper normal in size with pulmonary vascularity within normal limits. No adenopathy. No bone lesions.  IMPRESSION: Stable areas of scarring. No edema or consolidation. No change in cardiac silhouette.   Electronically Signed   By: Lowella Grip III M.D.   On: 01/25/2015 09:11     EKG Interpretation   Date/Time:  Wednesday January 25 2015 07:42:19 EDT Ventricular Rate:  64  PR Interval:  204 QRS Duration: 118 QT Interval:  416 QTC Calculation: 468 R Axis:   -25 Text Interpretation:  Sinus rhythm Ventricular premature complex  Nonspecific intraventricular conduction delay Low voltage, precordial  leads Borderline ST depression, inferior leads Borderline ST elevation,  lateral leads Sinus rhythm Artifact T wave abnormality Abnormal ekg  Confirmed by Carmin Muskrat  MD ([id number]) on 01/25/2015 8:11:08 AM     Pulse ox 99%, nasal cannula abnormal Cardiac 75 sinus rhythm normal  Chart review demonstrates multiple medical issues, including COPD, CHF, managed by our heart team.  I reviewed the results (including imaging as performed), agree  with the interpretation  On repeat exam the patient appears better.  We reviewed all findings.  MDM   Final diagnoses:  Dyspnea  UTI (lower urinary tract infection)   Patient with multiple medical issues, including CHF, COPD, aortic stenosis now presents with dyspnea. Patient is awake and alert, oriented appropriately, and there is no evidence for bacteremia or sepsis. However, given the patient's persistent dyspnea, concern for multifactorial etiology, including COPD exacerbation, patient was started on stories, breathing treatment, admitted for further evaluation, management. No chest pain, nonischemic EKG reassuring. Patient does have evidence for urinary tract infection, and given the recurrence, culture was sent in addition to initiation of antibiotics.  Carmin Muskrat, MD 01/25/15 1058

## 2015-01-25 NOTE — Progress Notes (Signed)
Patient's CBG was 525 mg/dl. Text paged mid-level on call.

## 2015-01-25 NOTE — ED Notes (Signed)
Admitting MD at bedside.

## 2015-01-26 ENCOUNTER — Ambulatory Visit: Payer: Medicare Other | Admitting: Endocrinology

## 2015-01-26 DIAGNOSIS — E1122 Type 2 diabetes mellitus with diabetic chronic kidney disease: Secondary | ICD-10-CM

## 2015-01-26 DIAGNOSIS — Z0289 Encounter for other administrative examinations: Secondary | ICD-10-CM

## 2015-01-26 DIAGNOSIS — I5032 Chronic diastolic (congestive) heart failure: Secondary | ICD-10-CM

## 2015-01-26 DIAGNOSIS — J441 Chronic obstructive pulmonary disease with (acute) exacerbation: Principal | ICD-10-CM

## 2015-01-26 DIAGNOSIS — N183 Chronic kidney disease, stage 3 (moderate): Secondary | ICD-10-CM

## 2015-01-26 LAB — COMPREHENSIVE METABOLIC PANEL
ALK PHOS: 97 U/L (ref 38–126)
ALT: 28 U/L (ref 14–54)
ANION GAP: 15 (ref 5–15)
AST: 34 U/L (ref 15–41)
Albumin: 3.2 g/dL — ABNORMAL LOW (ref 3.5–5.0)
BILIRUBIN TOTAL: 0.9 mg/dL (ref 0.3–1.2)
BUN: 63 mg/dL — ABNORMAL HIGH (ref 6–20)
CALCIUM: 9.3 mg/dL (ref 8.9–10.3)
CHLORIDE: 98 mmol/L — AB (ref 101–111)
CO2: 20 mmol/L — ABNORMAL LOW (ref 22–32)
Creatinine, Ser: 1.73 mg/dL — ABNORMAL HIGH (ref 0.44–1.00)
GFR calc Af Amer: 33 mL/min — ABNORMAL LOW (ref >60)
GFR calc non Af Amer: 29 mL/min — ABNORMAL LOW (ref >60)
GLUCOSE: 397 mg/dL — AB (ref 65–99)
Potassium: 4.5 mmol/L (ref 3.5–5.1)
Sodium: 133 mmol/L — ABNORMAL LOW (ref 135–145)
Total Protein: 6.6 g/dL (ref 6.5–8.1)

## 2015-01-26 LAB — GLUCOSE, CAPILLARY
Glucose-Capillary: 521 mg/dL — ABNORMAL HIGH (ref 65–99)
Glucose-Capillary: 399 mg/dL — ABNORMAL HIGH (ref 65–99)
Glucose-Capillary: 311 mg/dL — ABNORMAL HIGH (ref 65–99)
Glucose-Capillary: 404 mg/dL — ABNORMAL HIGH (ref 65–99)
Glucose-Capillary: 269 mg/dL — ABNORMAL HIGH (ref 65–99)

## 2015-01-26 LAB — CBC
HEMATOCRIT: 25.4 % — AB (ref 36.0–46.0)
HEMOGLOBIN: 8.5 g/dL — AB (ref 12.0–15.0)
MCH: 33.3 pg (ref 26.0–34.0)
MCHC: 33.5 g/dL (ref 30.0–36.0)
MCV: 99.6 fL (ref 78.0–100.0)
Platelets: 100 10*3/uL — ABNORMAL LOW (ref 150–400)
RBC: 2.55 MIL/uL — AB (ref 3.87–5.11)
RDW: 15.3 % (ref 11.5–15.5)
WBC: 5.7 10*3/uL (ref 4.0–10.5)

## 2015-01-26 MED ORDER — ALPRAZOLAM 0.5 MG PO TABS
1.0000 mg | ORAL_TABLET | Freq: Three times a day (TID) | ORAL | Status: DC | PRN
Start: 2015-01-26 — End: 2015-01-28
  Administered 2015-01-26 – 2015-01-28 (×4): 1 mg via ORAL
  Filled 2015-01-26 (×4): qty 2

## 2015-01-26 MED ORDER — INSULIN GLARGINE 100 UNIT/ML ~~LOC~~ SOLN
140.0000 [IU] | Freq: Every morning | SUBCUTANEOUS | Status: DC
  Filled 2015-01-26: qty 1.4

## 2015-01-26 MED ORDER — INSULIN GLARGINE 100 UNIT/ML ~~LOC~~ SOLN
70.0000 [IU] | Freq: Every morning | SUBCUTANEOUS | Status: DC
  Administered 2015-01-26 – 2015-01-28 (×3): 70 [IU] via SUBCUTANEOUS
  Filled 2015-01-26 (×3): qty 0.7

## 2015-01-26 MED ORDER — INSULIN ASPART 100 UNIT/ML ~~LOC~~ SOLN
15.0000 [IU] | Freq: Once | SUBCUTANEOUS | Status: AC
  Administered 2015-01-26: 15 [IU] via SUBCUTANEOUS

## 2015-01-26 MED ORDER — INSULIN GLARGINE 100 UNIT/ML ~~LOC~~ SOLN: 70.0000 [IU] | Freq: Every morning | SUBCUTANEOUS | Status: DC

## 2015-01-26 MED ORDER — INSULIN ASPART 100 UNIT/ML ~~LOC~~ SOLN
30.0000 [IU] | Freq: Once | SUBCUTANEOUS | Status: AC
  Administered 2015-01-26: 30 [IU] via SUBCUTANEOUS

## 2015-01-26 MED ORDER — INSULIN ASPART 100 UNIT/ML ~~LOC~~ SOLN
0.0000 [IU] | Freq: Three times a day (TID) | SUBCUTANEOUS | Status: DC
  Administered 2015-01-26: 8 [IU] via SUBCUTANEOUS
  Administered 2015-01-27: 11 [IU] via SUBCUTANEOUS
  Administered 2015-01-27 (×2): 15 [IU] via SUBCUTANEOUS
  Administered 2015-01-27: 8 [IU] via SUBCUTANEOUS
  Administered 2015-01-28: 5 [IU] via SUBCUTANEOUS
  Administered 2015-01-28: 11 [IU] via SUBCUTANEOUS
  Administered 2015-01-28: 3 [IU] via SUBCUTANEOUS

## 2015-01-26 NOTE — Progress Notes (Signed)
PROGRESS NOTE  [name] DJT:[id number] DOB: 1941-06-13 DOA: 01/25/2015 PCP: Ria Bush, MD  HPI: 74 y.o. female, with a past medical history of [place], diastolic heart failure, portal hypertension, COPD on 3 L of oxygen chronically at home, and aortic stenosis. She presented to the emergency department with a sudden onset of shortness of breath   Subjective / 24 H Interval events - very anxious, breathing better - no chest / abdominal pain  Assessment/Plan: Principal Problem:   Chronic obstructive pulmonary disease with acute exacerbation Active Problems:   Aortic valve disorder   GERD   Autoimmune hepatitis   Other pancytopenia   Chronic diastolic CHF (congestive heart failure)   CKD stage 3 due to type 2 diabetes mellitus   Chronic respiratory failure   Acute cystitis without hematuria   UTI (urinary tract infection)   COPD exacerbation   UTI (lower urinary tract infection)   COPD exacerbation - Patient w/ dyspnea requiring additional oxygen - continue duonebs, pulmicort, Doxycycline, Solumedrol, fluttervalve.  UTI - Recurrent UTIs.  - Given Fosfomycin in the ER. Urine culture ordered.  Pancytopenia - Likely secondary to NASH and CKD. Stable.  CKD  - Stable. Creatinine at baseline.  NASH - Will continue Lactulose BID, Diuretics, and Nadolol - Patient states she has chronic Black Stools. Will avoid Heparin / Lovenox.  Diastolic CHF - Echo: LVEF 08/14/14 60 - 65%. Mild LVH - Appears euvolemic. - Continue diuretics. Daily weights, low salt diet.  Diabetes Mellitus - On Sliding scale lantus (?) 80 - 140 units in am. Will give 100. Plus SSI and meal coverage. - Check Hgb A1C  Abnormal EKG - ?ST elevation - ST depression. - Troponin WNL. Patient not complaining of CP.  Anxiety - continue home xanax  Diet: Diet heart healthy/carb modified Room service appropriate?: Yes; Fluid consistency:: Thin Fluids: none DVT Prophylaxis: SCD  Code  Status: Full Code Family Communication: d/w patient   Disposition Plan: home when ready   Consultants:  None   Procedures:  None    Antibiotics  Anti-infectives    Start     Dose/Rate Route Frequency Ordered Stop   01/25/15 2200  rifaximin (XIFAXAN) tablet 550 mg     550 mg Oral 2 times daily 01/25/15 1731     01/25/15 2200  doxycycline (VIBRA-TABS) tablet 100 mg     100 mg Oral Every 12 hours 01/25/15 1731         Studies  Dg Chest 2 View  01/25/2015   CLINICAL DATA:  Progressive shortness of breath  EXAM: CHEST  2 VIEW  COMPARISON:  December 05, 2014  FINDINGS: There is stable scarring in the left mid lung as well as in the right perihilar regions. There is no edema or consolidation. No new opacity. Heart is upper normal in size with pulmonary vascularity within normal limits. No adenopathy. No bone lesions.  IMPRESSION: Stable areas of scarring. No edema or consolidation. No change in cardiac silhouette.   Electronically Signed   By: Lowella Grip III M.D.   On: 01/25/2015 09:11    Objective  Filed Vitals:   01/25/15 2012 01/26/15 0357 01/26/15 0823 01/26/15 0844  BP: 139/45 137/37  125/64  Pulse: 55 58 60   Temp: 98.4 F (36.9 C) 97.7 F (36.5 C)    TempSrc: Oral Oral    Resp: 20 18 20    Weight:  96.8 kg (213 lb 6.5 oz)    SpO2: 99% 100%  Intake/Output Summary (Last 24 hours) at 01/26/15 1334 Last data filed at 01/26/15 0800  Gross per 24 hour  Intake    620 ml  Output    875 ml  Net   -255 ml   Filed Weights   01/25/15 1600 01/26/15 0357  Weight: 98.9 kg (218 lb 0.6 oz) 96.8 kg (213 lb 6.5 oz)    Exam:  General:  NAD  HEENT: no scleral icterus, PERRL  Cardiovascular: RRR without MRG, 2+ peripheral pulses, no edema  Respiratory: CTA biL, good air movement, no wheezing, moves air well  Abdomen: soft, non tender, BS +, no guarding  MSK/Extremities: no clubbing/cyanosis, no joint swelling  Skin: no rashes  Neuro: non focal, strength 5/5  in all 4   Data Reviewed: Basic Metabolic Panel:  Recent Labs Lab 01/25/15 0817 01/25/15 2200 01/26/15 0530  NA 135 130* 133*  K 4.6 5.7* 4.5  CL 99* 94* 98*  CO2 26 21* 20*  GLUCOSE 323* 546* 397*  BUN 74* 66* 63*  CREATININE 1.61* 1.68* 1.73*  CALCIUM 9.6 9.4 9.3   Liver Function Tests:  Recent Labs Lab 01/25/15 0817 01/26/15 0530  AST 30 34  ALT 27 28  ALKPHOS 99 97  BILITOT 1.3* 0.9  PROT 6.3* 6.6  ALBUMIN 3.3* 3.2*   CBC:  Recent Labs Lab 01/20/15 1329 01/25/15 0817 01/26/15 0530  WBC  --  3.8* 5.7  NEUTROABS  --  3.0  --   HGB 8.5* 8.7* 8.5*  HCT  --  26.3* 25.4*  MCV  --  99.6 99.6  PLT  --  108* 100*   Cardiac Enzymes:  Recent Labs Lab 01/25/15 0817 01/25/15 1937  TROPONINI <0.03 <0.03   BNP (last 3 results)  Recent Labs  08/29/14 1820 01/25/15 0817  BNP 233.0* 95.5    ProBNP (last 3 results)  Recent Labs  03/26/14 0259 08/11/14 1755  PROBNP 3352.0* 226.4*    CBG:  Recent Labs Lab 01/25/15 2050 01/26/15 0155 01/26/15 0432 01/26/15 1137  GLUCAP 525* 521* 399* 311*    Recent Results (from the past 240 hour(s))  Urine culture     Status: None (Preliminary result)   Collection Time: 01/25/15  9:16 AM  Result Value Ref Range Status   Specimen Description URINE, RANDOM  Final   Special Requests NONE  Final   Colony Count   Final    >=100,000 COLONIES/ML Performed at [facility]    Culture   Final    ESCHERICHIA COLI Performed at [facility]    Report Status PENDING  Incomplete     Scheduled Meds: . albuterol  5 mg Nebulization Once  . budesonide (PULMICORT) nebulizer solution  0.5 mg Nebulization BID  . buPROPion  150 mg Oral Daily  . docusate sodium  100 mg Oral BID  . doxycycline  100 mg Oral Q12H  . furosemide  40 mg Oral Daily  . furosemide  80 mg Oral Daily  . guaiFENesin  600 mg Oral BID  . insulin aspart  0-15 Units Subcutaneous TID WC  . insulin aspart  4 Units Subcutaneous TID WC   . insulin glargine  70 Units Subcutaneous q morning - 10a  . ipratropium-albuterol  3 mL Nebulization BID  . lactulose (encephalopathy)  20 g Oral BID  . loratadine  10 mg Oral Daily  . methylPREDNISolone (SOLU-MEDROL) injection  60 mg Intravenous 3 times per day  . nadolol  10 mg Oral Daily  . pantoprazole  40 mg Oral BID  . QUEtiapine  200 mg Oral QHS  . rifaximin  550 mg Oral BID  . rOPINIRole  4 mg Oral QHS  . simvastatin  40 mg Oral QPM  . sodium chloride  3 mL Intravenous Q12H  . sodium chloride  3 mL Intravenous Q12H  . spironolactone  12.5 mg Oral Daily  . venlafaxine XR  150 mg Oral Q breakfast   Continuous Infusions:   Marzetta Board, MD [facility] Pager [phone]. If 7 PM - 7 AM, please contact night-coverage at [email or web address], password [email or web address] 01/26/2015, 1:34 PM  LOS: 1 day

## 2015-01-26 NOTE — Evaluation (Signed)
Physical Therapy Evaluation Patient Details Name: [name] MRN: [id number] DOB: 12-04-1940 Today's Date: 01/26/2015   History of Present Illness  Pt is a 74 y.o. Female admitted 01/25/15 with SOB and positive for UTI. PMH: COPD on 3L home O2, NASH, diastolic heart failure, portal HTN, and aortic stenosis.   Clinical Impression  Pt presents with the below listed impairments and will benefit from skilled PT intervention to address these impairments and increase functional independence and safety with mobility. Pt with close to 24/7 care at home but continues to have multiple falls in the home. Would benefit from short term rehab stay to increase overall strength, endurance, safety with mobility, and activity tolerance but if declines, will need HHPT follow up. Discussed at length with pt and caregiver importance of using RW in the home for all mobility at this time for balance as well as for energy conservation. PT currently steady A level with RW. Pt states she plans to talk with her husband this evening but open to the idea of short term rehab as an option.     Follow Up Recommendations SNF;Home health PT;Supervision/Assistance - 24 hour (If declines SNF, HHPT)    Equipment Recommendations  None recommended by PT    Recommendations for Other Services       Precautions / Restrictions Precautions Precautions: Fall Restrictions Weight Bearing Restrictions: No      Mobility  Bed Mobility Overal bed mobility: Modified Independent             General bed mobility comments: HOB elevated and used rails mod I level. Pt has hospital bed at home  Transfers Overall transfer level: Needs assistance Equipment used: Rolling walker (2 wheeled) Transfers: Sit to/from Stand Sit to Stand: Min guard         General transfer comment: cues for hand placement and technique for sit to stands  Ambulation/Gait Ambulation/Gait assistance: Min guard Ambulation Distance (Feet): 60  Feet Assistive device: Rolling walker (2 wheeled) Gait Pattern/deviations: Step-through pattern;Decreased stride length     General Gait Details: Pt with cues for upright posture during gait and maintaining positioning of RWclose to body  Stairs            Wheelchair Mobility    Modified Rankin (Stroke Patients Only)       Balance Overall balance assessment: History of Falls;Needs assistance Sitting-balance support: Feet supported;No upper extremity supported Sitting balance-Leahy Scale: Good     Standing balance support: Single extremity supported;During functional activity Standing balance-Leahy Scale: Fair                               Pertinent Vitals/Pain Pain Assessment: No/denies pain    Home Living Family/patient expects to be discharged to:: Private residence Living Arrangements: Spouse/significant other Available Help at Discharge: Family;Friend(s);Available 24 hours/day (nearly 24/7 care; pt alone 5-8am) Type of Home: House Home Access: Ramped entrance Entrance Stairs-Rails: Right Entrance Stairs-Number of Steps: 3 Home Layout: One level Home Equipment: Walker - 2 wheels;Walker - 4 wheels;Bedside commode;Wheelchair - manual;Hospital bed Additional Comments: On 3L O2 at home    Prior Function Level of Independence: Needs assistance   Gait / Transfers Assistance Needed: Pt reports she uses her RW sometimes only; h/o of multiple falls  ADL's / Homemaking Assistance Needed: caregiver assists with ADLs per report  Comments: Pt reports her husband is not well himself and is wanting her to come home asap. He still works  and caregivers are with her most of the time while he is at work.     Hand Dominance   Dominant Hand: Right    Extremity/Trunk Assessment   Upper Extremity Assessment: Defer to OT evaluation           Lower Extremity Assessment: Generalized weakness      Cervical / Trunk Assessment: Normal  Communication    Communication: No difficulties  Cognition Arousal/Alertness: Awake/alert Behavior During Therapy: Anxious;WFL for tasks assessed/performed Overall Cognitive Status: Within Functional Limits for tasks assessed                      General Comments General comments (skin integrity, edema, etc.): Pt anxious during session in regards to mobility. Caregiver (sister) present during session but minimally involved or asking questions when discussing d/c planning. Maintained O2 sats > 90% on 3L of O2 via [place]     Exercises        Assessment/Plan    PT Assessment Patient needs continued PT services  PT Diagnosis Difficulty walking;Generalized weakness;Acute pain   PT Problem List Decreased strength;Decreased activity tolerance;Decreased balance;Decreased mobility;Decreased safety awareness;Decreased knowledge of use of DME;Cardiopulmonary status limiting activity;Pain  PT Treatment Interventions DME instruction;Gait training;Stair training;Functional mobility training;Therapeutic activities;Therapeutic exercise;Balance training;Neuromuscular re-education;Wheelchair mobility training;Patient/family education;Modalities   PT Goals (Current goals can be found in the Care Plan section) Acute Rehab PT Goals Patient Stated Goal: go home soon PT Goal Formulation: With patient/family Time For Goal Achievement: 02/09/15 Potential to Achieve Goals: Good    Frequency Min 3X/week   Barriers to discharge   Pt has near 24/7 S but still continues with consistent falls. Pt states her husband is not well himself.    Co-evaluation               End of Session Equipment Utilized During Treatment: Gait belt;Oxygen Activity Tolerance: Patient tolerated treatment well Patient left: in bed;with call bell/phone within reach;with family/visitor present Nurse Communication: Mobility status         Time: 1510-1530 PT Time Calculation (min) (ACUTE ONLY): 20 min   Charges:   PT  Evaluation $Initial PT Evaluation Tier I: 1 Procedure PT Treatments $Gait Training: 8-22 mins   PT G Codes:        Allayne Gitelman  Pager #: [phone] 01/26/2015, 3:52 PM

## 2015-01-26 NOTE — Progress Notes (Signed)
Spoke with patient about her diabetes and insulin dosages. States that she has had DM for 10-12 years. Takes Lantus 80-100 units daily and Humalog 5-30 units sliding scale at 4 pm at home.  States that the Lantus dosage was changed to 140 units about 6 weeks ago, but she has only taken that much twice in the six weeks. States that she takes Lantus 80 units and may go up to 100 units depending on what her blood sugar is. Checks blood sugars at home three times per day.  Dr. Loanne Drilling is her endocrinologist,  Dr. Danise Mina is her PCP.  Seems to be very in tune with her diabetes blood sugar control. Spoke with Dr. Cruzita Lederer on the phone stating that patient takes Lantus 80 units at home. Dr. Cruzita Lederer changed the order to Lantus 70 units for this am. Patient stated that she would not take Lantus 140 units. Will continue to follow while in hospital. [name] RN BSN CDE

## 2015-01-26 NOTE — Progress Notes (Addendum)
Shift sugar issues: RN paged earlier because pt's CBG was 525. At that point, Novolog 20U x 1 and extra dose of Lantus 10U was given. Stat BMP at that time confirmed glucose with closed gap and CO2 over 20. K+ was 5.7. Kayexalate 15 gms ordered. Asked RN to page back with sugar 2hrs after above insulin given. This NP called back later because sugar had not been called back to me. Insulin was given but Kayexylate was not. RN texted sugar of 521 and gave Kayexalate.  At this time, will give additional 30U Novolog and increase pt's daily Lantus to 140U (is on 100-140 at home). Recheck sugar in 1.5 hrs to be called to this NP.  Pt is on IV steroids which is likely cause of accelerated hyperglycemia. If next sugar not trending down, start of insulin drip. BMP this am to recheck K+, gap, CO2.  Clance Boll, NP [facility] Update: next CBG after above is 399. Will give 15U novolog and follow sugar. Pt will receive Lantus 140U this am.  Patrica Duel, NP

## 2015-01-26 NOTE — Evaluation (Signed)
Occupational Therapy Evaluation Patient Details Name: [name] MRN: [id number] DOB: Aug 30, 1940 Today's Date: 01/26/2015    History of Present Illness Pt is a 74 y.o. Female admitted 01/25/15 with SOB and positive for UTI. PMH: COPD on 3L home O2, NASH, diastolic heart failure, portal HTN, and aortic stenosis.    Clinical Impression   PTA pt lived at home and needed assistance for ADLs. She had nearly 24/7 assist but continues to ambulate without RW and has had multiple falls at home. Pt is limited by weakness and cardiopulmonary status and feel that she is a high fall risk. Recommend SNF for ST Rehab at d/c. Pt will benefit from acute OT for strengthening and endurance.     Follow Up Recommendations  SNF;Supervision/Assistance - 24 hour    Equipment Recommendations  None recommended by OT    Recommendations for Other Services       Precautions / Restrictions Precautions Precautions: Fall Restrictions Weight Bearing Restrictions: No      Mobility Bed Mobility               General bed mobility comments: Pt sitting up when OT arrived  Transfers Overall transfer level: Needs assistance Equipment used: 1 person hand held assist Transfers: Sit to/from Stand;Stand Pivot Transfers Sit to Stand: Min assist Stand pivot transfers: Min assist       General transfer comment: Min A for balance and to transfer to recliner    Balance Overall balance assessment: History of Falls                                          ADL Overall ADL's : Needs assistance/impaired Eating/Feeding: Independent;Sitting   Grooming: Set up;Sitting   Upper Body Bathing: Set up;Sitting   Lower Body Bathing: Minimal assistance;Sit to/from stand   Upper Body Dressing : Set up;Sitting   Lower Body Dressing: Minimal assistance;Sit to/from stand   Toilet Transfer: Minimal assistance;Stand-pivot;BSC (hand held assist)   Toileting- Clothing Manipulation and Hygiene:  Maximal assistance;Sit to/from stand         General ADL Comments: Pt limited by LE weakness and is a high fall risk. Discussed SNF as an option and pt is open to going.      Vision Additional Comments: No change from baseline          Pertinent Vitals/Pain Pain Assessment: No/denies pain     Hand Dominance Right   Extremity/Trunk Assessment Upper Extremity Assessment Upper Extremity Assessment: Generalized weakness   Lower Extremity Assessment Lower Extremity Assessment: Generalized weakness   Cervical / Trunk Assessment Cervical / Trunk Assessment: Normal   Communication Communication Communication: No difficulties   Cognition Arousal/Alertness: Awake/alert Behavior During Therapy: WFL for tasks assessed/performed Overall Cognitive Status: Within Functional Limits for tasks assessed                                Home Living Family/patient expects to be discharged to:: Private residence Living Arrangements: Spouse/significant other Available Help at Discharge: Family;Friend(s);Available 24 hours/day (nearly 24/7 care; pt alone from 5am-8am) Type of Home: House Home Access: Stairs to enter [employer] of Steps: 3 Entrance Stairs-Rails: Right Home Layout: One level     Bathroom Shower/Tub: Teacher, early years/pre: Standard     Home Equipment: Environmental consultant - 2 wheels;Walker - 4 wheels;Bedside commode;Wheelchair -  manual;Hospital bed   Additional Comments: On 3L O2 at home      Prior Functioning/Environment Level of Independence: Needs assistance  Gait / Transfers Assistance Needed: Pt reports she is "supposed" to use RW for ambulation but does not, and has hx of multiple falls.  ADL's / Homemaking Assistance Needed: spouse and friend/caregiver assist with transfers and ADLs.         OT Diagnosis: Generalized weakness;Acute pain   OT Problem List: Decreased strength;Impaired balance (sitting and/or standing);Decreased  activity tolerance;Decreased knowledge of use of DME or AE;Cardiopulmonary status limiting activity   OT Treatment/Interventions: Self-care/ADL training;Therapeutic exercise;Energy conservation;DME and/or AE instruction;Therapeutic activities;Patient/family education;Balance training    OT Goals(Current goals can be found in the care plan section) Acute Rehab OT Goals Patient Stated Goal: to be safe and go home OT Goal Formulation: With patient Time For Goal Achievement: 02/09/15 Potential to Achieve Goals: Good ADL Goals Pt Will Perform Lower Body Bathing: with set-up;with supervision;sit to/from stand Pt Will Perform Lower Body Dressing: with set-up;with supervision;sit to/from stand Pt Will Transfer to Toilet: with supervision;ambulating;bedside commode Pt Will Perform Toileting - Clothing Manipulation and hygiene: with supervision;sit to/from stand  OT Frequency: Min 2X/week   Barriers to D/C: Decreased caregiver support             End of Session Equipment Utilized During Treatment: Gait belt;Oxygen  Activity Tolerance: Patient tolerated treatment well Patient left: in chair;with call bell/phone within reach;with family/visitor present   Time: 1610-9604 OT Time Calculation (min): 26 min Charges:  OT General Charges $OT Visit: 1 Procedure OT Evaluation $Initial OT Evaluation Tier I: 1 Procedure OT Treatments $Self Care/Home Management : 8-22 mins G-Codes:    Juluis Rainier 02/05/2015, 11:00 AM Cyndie Chime, OTR/L Occupational Therapist [phone] (pager)

## 2015-01-27 LAB — URINE CULTURE: Colony Count: >=100000

## 2015-01-27 LAB — GLUCOSE, CAPILLARY
GLUCOSE-CAPILLARY: 393 mg/dL — AB (ref 65–99)
Glucose-Capillary: 355 mg/dL — ABNORMAL HIGH (ref 65–99)
Glucose-Capillary: 420 mg/dL — ABNORMAL HIGH (ref 65–99)
Glucose-Capillary: 386 mg/dL — ABNORMAL HIGH (ref 65–99)
Glucose-Capillary: 275 mg/dL — ABNORMAL HIGH (ref 65–99)

## 2015-01-27 LAB — HEMOGLOBIN A1C
Hgb A1c MFr Bld: 7.1 % — ABNORMAL HIGH (ref 4.8–5.6)
Mean Plasma Glucose: 157 mg/dL

## 2015-01-27 MED ORDER — PREDNISONE 20 MG PO TABS
40.0000 mg | ORAL_TABLET | Freq: Every day | ORAL | Status: DC
  Administered 2015-01-28: 40 mg via ORAL
  Filled 2015-01-27 (×2): qty 2

## 2015-01-27 NOTE — Care Management Note (Signed)
Case Management Note  Patient Details  Name: [name] MRN: [id number] Date of Birth: 1940-10-22  Subjective/Objective:   Pt admitted with acute exacerbation of COPD                 Action/Plan:  Pt is from home, however was previously in SNF.  Consult made to SW for SNF placement.  CM will continue to monitor for disposition needs   Expected Discharge Date:                  Expected Discharge Plan:  [place]  In-House Referral:  Clinical Social Work  Discharge planning Services  CM Consult  Post Acute Care Choice:    Choice offered to:     DME Arranged:    DME Agency:     HH Arranged:    [place] Agency:     Status of Service:     Medicare Important Message Given:  Yes Date Medicare IM Given:  01/27/15 Medicare IM give by:  Elenor Quinones Date Additional Medicare IM Given:    Additional Medicare Important Message give by:     If discussed at [place] of Stay Meetings, dates discussed:    Additional Comments:  Maryclare Labrador, RN 01/27/2015, 11:23 AM

## 2015-01-27 NOTE — Progress Notes (Signed)
Notified by CCMD pt had two missed beats - 1st 1.87 sec, 2nd 1.88 sec.  Strips saved to EPIC.  Pt asymptomatic, VSS.  Pt HR currently 40-50 bpm while asleep.  Pt resting comfortably with call bell within reach.  Will continue to monitor pt closely.  Claudette Stapler, RN

## 2015-01-27 NOTE — Progress Notes (Signed)
Results for [name], [name] (MRN [id number]) as of 01/27/2015 12:01  Ref. Range 01/26/2015 11:37 01/26/2015 16:39 01/26/2015 20:46 01/27/2015 06:03 01/27/2015 11:17  Glucose-Capillary Latest Ref Range: 65-99 mg/dL 311 (H) 404 (H) 269 (H) 355 (H) 420 (H)  CBGs continue to be elevated.  Recommend increasing Lantus to 85 units daily. (20% increase of current order dosage) May need to increase Novolog correction scale to RESISTANT scale TID & HS.  Will continue to follow while in hospital. [name] RN BSN CDE

## 2015-01-27 NOTE — Consult Note (Signed)
   [facility] CM Inpatient Consult   01/27/2015  [name] [birth year] [id number] Patient is currently active with [facility] for chronic disease management services.  Patient has been engaged by a [employer].  Our community based plan of care has focused on disease management and community resource support.  Patient will receive a post discharge transition of care call and will be evaluated for monthly home visits for assessments and disease process education.  Made Inpatient Case Manager aware that [facility] following. Of note, [facility] Care Management services does not replace or interfere with any services that are arranged by inpatient case management or social work.  For additional questions or referrals please contact: Natividad Brood, RN BSN [facility] Liaison  [phone] business mobile phone

## 2015-01-27 NOTE — Progress Notes (Signed)
PROGRESS NOTE  [name] GEZ:[id number] DOB: 03-06-1941 DOA: 01/25/2015 PCP: Ria Bush, MD  HPI: 74 y.o. female, with a past medical history of [place], diastolic heart failure, portal hypertension, COPD on 3 L of oxygen chronically at home, and aortic stenosis. She presented to the emergency department with a sudden onset of shortness of breath   Subjective / 24 H Interval events - no complaints, breathing stable  Assessment/Plan: Principal Problem:   Chronic obstructive pulmonary disease with acute exacerbation Active Problems:   Aortic valve disorder   GERD   Autoimmune hepatitis   Other pancytopenia   Chronic diastolic CHF (congestive heart failure)   CKD stage 3 due to type 2 diabetes mellitus   Chronic respiratory failure   Acute cystitis without hematuria   UTI (urinary tract infection)   COPD exacerbation   UTI (lower urinary tract infection)   COPD exacerbation - Patient w/ dyspnea requiring additional oxygen - continue duonebs, pulmicort, Doxycycline, fluttervalve. - change solumedrol to prednisone - deconditioned, PT recommending SNF, patient agreeable, SW consulted   UTI - Recurrent UTIs.  - s/p Fosfomycin treatment - Urine culture with E coli  Pancytopenia - Likely secondary to NASH and CKD. Stable.  CKD  - Stable. Creatinine at baseline.  NASH - Will continue Lactulose BID, Diuretics, and Nadolol - Patient states she has chronic Black Stools. Will avoid Heparin / Lovenox.  Diastolic CHF - Echo: LVEF 08/14/14 60 - 65%. Mild LVH - Appears euvolemic. - Continue diuretics. Daily weights, low salt diet.  Diabetes Mellitus - On Sliding scale and lantus  - CBGs high due to steroids  Abnormal EKG - ?ST elevation - ST depression. - Troponin WNL. Patient not complaining of CP.  Anxiety - continue home xanax   Diet: Diet heart healthy/carb modified Room service appropriate?: Yes; Fluid consistency:: Thin Fluids: none DVT  Prophylaxis: SCD  Code Status: Full Code Family Communication: d/w patient   Disposition Plan: SNF 1 day  Consultants:  None   Procedures:  None    Antibiotics  Anti-infectives    Start     Dose/Rate Route Frequency Ordered Stop   01/25/15 2200  rifaximin (XIFAXAN) tablet 550 mg     550 mg Oral 2 times daily 01/25/15 1731     01/25/15 2200  doxycycline (VIBRA-TABS) tablet 100 mg     100 mg Oral Every 12 hours 01/25/15 1731         Studies  No results found.  Objective  Filed Vitals:   01/26/15 2050 01/26/15 2119 01/27/15 0453 01/27/15 0718  BP: 153/52  139/39   Pulse: 51  49   Temp: 98.3 F (36.8 C)  98 F (36.7 C)   TempSrc: Oral  Oral   Resp: 18  18   Weight:   96.4 kg (212 lb 8.4 oz)   SpO2: 100% 99% 100% 98%    Intake/Output Summary (Last 24 hours) at 01/27/15 1422 Last data filed at 01/27/15 0800  Gross per 24 hour  Intake    540 ml  Output   1150 ml  Net   -610 ml   Filed Weights   01/25/15 1600 01/26/15 0357 01/27/15 0453  Weight: 98.9 kg (218 lb 0.6 oz) 96.8 kg (213 lb 6.5 oz) 96.4 kg (212 lb 8.4 oz)    Exam:  General:  NAD  HEENT: no scleral icterus, PERRL  Cardiovascular: RRR without MRG, 2+ peripheral pulses, no edema  Respiratory: CTA biL, good air movement, no wheezing, moves air  well  Abdomen: soft, non tender, BS +, no guarding  MSK/Extremities: no clubbing/cyanosis, no joint swelling  Skin: no rashes  Data Reviewed: Basic Metabolic Panel:  Recent Labs Lab 01/25/15 0817 01/25/15 2200 01/26/15 0530  NA 135 130* 133*  K 4.6 5.7* 4.5  CL 99* 94* 98*  CO2 26 21* 20*  GLUCOSE 323* 546* 397*  BUN 74* 66* 63*  CREATININE 1.61* 1.68* 1.73*  CALCIUM 9.6 9.4 9.3   Liver Function Tests:  Recent Labs Lab 01/25/15 0817 01/26/15 0530  AST 30 34  ALT 27 28  ALKPHOS 99 97  BILITOT 1.3* 0.9  PROT 6.3* 6.6  ALBUMIN 3.3* 3.2*   CBC:  Recent Labs Lab 01/25/15 0817 01/26/15 0530  WBC 3.8* 5.7  NEUTROABS 3.0  --     HGB 8.7* 8.5*  HCT 26.3* 25.4*  MCV 99.6 99.6  PLT 108* 100*   Cardiac Enzymes:  Recent Labs Lab 01/25/15 0817 01/25/15 1937  TROPONINI <0.03 <0.03   BNP (last 3 results)  Recent Labs  08/29/14 1820 01/25/15 0817  BNP 233.0* 95.5    ProBNP (last 3 results)  Recent Labs  03/26/14 0259 08/11/14 1755  PROBNP 3352.0* 226.4*    CBG:  Recent Labs Lab 01/26/15 1639 01/26/15 2046 01/27/15 0603 01/27/15 1117 01/27/15 1200  GLUCAP 404* 269* 355* 420* 393*    Recent Results (from the past 240 hour(s))  Urine culture     Status: None   Collection Time: 01/25/15  9:16 AM  Result Value Ref Range Status   Specimen Description URINE, RANDOM  Final   Special Requests NONE  Final   Colony Count   Final    >=100,000 COLONIES/ML Performed at [facility]    Culture   Final    ESCHERICHIA COLI Performed at [facility]    Report Status 01/27/2015 FINAL  Final   Organism ID, Bacteria ESCHERICHIA COLI  Final      Susceptibility   Escherichia coli - MIC*    AMPICILLIN >=32 RESISTANT Resistant     CEFAZOLIN 8 SENSITIVE Sensitive     CEFTRIAXONE <=1 SENSITIVE Sensitive     CIPROFLOXACIN >=4 RESISTANT Resistant     GENTAMICIN 2 SENSITIVE Sensitive     LEVOFLOXACIN >=8 RESISTANT Resistant     NITROFURANTOIN <=16 SENSITIVE Sensitive     TOBRAMYCIN <=1 SENSITIVE Sensitive     TRIMETH/SULFA <=20 SENSITIVE Sensitive     PIP/TAZO <=4 SENSITIVE Sensitive     * ESCHERICHIA COLI     Scheduled Meds: . albuterol  5 mg Nebulization Once  . budesonide (PULMICORT) nebulizer solution  0.5 mg Nebulization BID  . buPROPion  150 mg Oral Daily  . docusate sodium  100 mg Oral BID  . doxycycline  100 mg Oral Q12H  . furosemide  40 mg Oral Daily  . furosemide  80 mg Oral Daily  . guaiFENesin  600 mg Oral BID  . insulin aspart  0-15 Units Subcutaneous TID AC & HS  . insulin aspart  4 Units Subcutaneous TID WC  . insulin glargine  70 Units Subcutaneous q morning -  10a  . ipratropium-albuterol  3 mL Nebulization BID  . lactulose (encephalopathy)  20 g Oral BID  . loratadine  10 mg Oral Daily  . methylPREDNISolone (SOLU-MEDROL) injection  60 mg Intravenous 3 times per day  . pantoprazole  40 mg Oral BID  . QUEtiapine  200 mg Oral QHS  . rifaximin  550 mg Oral BID  .  rOPINIRole  4 mg Oral QHS  . simvastatin  40 mg Oral QPM  . sodium chloride  3 mL Intravenous Q12H  . sodium chloride  3 mL Intravenous Q12H  . spironolactone  12.5 mg Oral Daily  . venlafaxine XR  150 mg Oral Q breakfast   Continuous Infusions:   Marzetta Board, MD [facility] Pager [phone]. If 7 PM - 7 AM, please contact night-coverage at [email or web address], password [facility] 01/27/2015, 2:22 PM  LOS: 2 days

## 2015-01-27 NOTE — Progress Notes (Signed)
Physical Therapy Treatment Patient Details Name: [name] MRN: [id number] DOB: Jun 18, 1941 Today's Date: 01/27/2015    History of Present Illness Pt is a 74 y.o. Female admitted 01/25/15 with SOB and positive for UTI. PMH: COPD on 3L home O2, NASH, diastolic heart failure, portal HTN, and aortic stenosis.     PT Comments    Patient had just gotten back into bed but very motivated to get back up and attempt some ambulation. Patient cautious and moves slowly but overall well. On 3L of O2 with ambulation. Patient now agreeable to SNF for ongoing therapy to improve mobility and functional independence prior to returning home.   Follow Up Recommendations  SNF     Equipment Recommendations  None recommended by PT    Recommendations for Other Services       Precautions / Restrictions Precautions Precautions: Fall Precaution Comments: On 3L of O2 with ambulation    Mobility  Bed Mobility Overal bed mobility: Modified Independent                Transfers       Sit to Stand: Min guard         General transfer comment: cues for hand placement and technique for sit to stands. MG for safety  Ambulation/Gait Ambulation/Gait assistance: Min guard Ambulation Distance (Feet): 100 Feet Assistive device: Rolling walker (2 wheeled) Gait Pattern/deviations: Step-through pattern;Decreased stride length         Stairs            Wheelchair Mobility    Modified Rankin (Stroke Patients Only)       Balance                                    Cognition Arousal/Alertness: Awake/alert Behavior During Therapy: WFL for tasks assessed/performed Overall Cognitive Status: Within Functional Limits for tasks assessed                      Exercises      General Comments        Pertinent Vitals/Pain Pain Assessment: No/denies pain    Home Living                      Prior Function            PT Goals (current goals can now  be found in the care plan section) Progress towards PT goals: Progressing toward goals    Frequency  Min 3X/week    PT Plan Current plan remains appropriate    Co-evaluation             End of Session Equipment Utilized During Treatment: Gait belt;Oxygen Activity Tolerance: Patient tolerated treatment well Patient left: in bed;with call bell/phone within reach;with family/visitor present     Time: 9449-6759 PT Time Calculation (min) (ACUTE ONLY): 19 min  Charges:  $Gait Training: 8-22 mins                    G Codes:      Jacqualyn Posey 01/27/2015, 2:18 PM 01/27/2015 Jacqualyn Posey PTA [phone] pager [phone] office

## 2015-01-27 NOTE — Clinical Social Work Note (Signed)
Clinical Social Work Assessment  Patient Details  Name: [name] MRN: [id number] Date of Birth: September 13, 1940  Date of referral:  01/27/15               Reason for consult:  Facility Placement                Permission sought to share information with:  Facility Sport and exercise psychologist, Family Supports Permission granted to share information::  Yes, Verbal Permission Granted  Name::     Wellsite geologist::  [facility]  Relationship::  caregiver  Contact Information:     Housing/Transportation Living arrangements for the past 2 months:  [place] of Information:  Patient Patient Interpreter Needed:  None Criminal Activity/Legal Involvement Pertinent to Current Situation/Hospitalization:  No - Comment as needed Significant Relationships:  Adult Children, Siblings, Spouse, Other(Comment) (caregiver) Lives with:  Spouse Do you feel safe going back to the place where you live?  Yes Need for family participation in patient care:  Yes (Comment)  Care giving concerns:  Pt does not feel as if her current supports can handle her level of needs at this time.   Social Worker assessment / plan:  CSW spoke with pt about PT recommendation for SNF  Employment status:  Retired Nurse, adult PT Recommendations:  [place] / Referral to community resources:  [place]  Patient/Family's Response to care:  Pt is agreeable to SNF for short term rehab before returning home.  Pt has caregiver 5 hours a day, her sister helps out for 3 hours and then her husband returns home for work- pt would appreciate skilled care to help her return to her home and not require home health services to enter her home.  Patient/Family's Understanding of and Emotional Response to Diagnosis, Current Treatment, and Prognosis: patient has good understanding and states that she has been dealing with her medial issues for some  time  Emotional Assessment Appearance:  Appears stated age Attitude/Demeanor/Rapport:    Affect (typically observed):  Appropriate, Accepting Orientation:  Oriented to Self, Oriented to Place, Oriented to  Time, Oriented to Situation Alcohol / Substance use:  Not Applicable Psych involvement (Current and /or in the community):  No (Comment)  Discharge Needs  Concerns to be addressed:  Discharge Planning Concerns Readmission within the last 30 days:  No Current discharge risk:  Physical Impairment Barriers to Discharge:  Continued Medical Work up   [employer], [place] M, LCSW 01/27/2015, 1:32 PM

## 2015-01-27 NOTE — Clinical Social Work Placement (Signed)
   CLINICAL SOCIAL WORK PLACEMENT  NOTE  Date:  01/27/2015  Patient Details  Name: [name] MRN: [id number] Date of Birth: 08/12/1941  Clinical Social Work is seeking post-discharge placement for this patient at the [place] level of care (*CSW will initial, date and re-position this form in  chart as items are completed):  Yes   Patient/family provided with [place] Work Department's list of facilities offering this level of care within the geographic area requested by the patient (or if unable, by the patient's family).  Yes   Patient/family informed of their freedom to choose among providers that offer the needed level of care, that participate in Medicare, Medicaid or managed care program needed by the patient, have an available bed and are willing to accept the patient.  Yes   Patient/family informed of [place]'s ownership interest in [facility] and [facility], as well as of the fact that they are under no obligation to receive care at these facilities.  PASRR submitted to EDS on 01/27/15     PASRR number received on 01/27/15     Existing PASRR number confirmed on       FL2 transmitted to all facilities in geographic area requested by pt/family on 01/27/15     FL2 transmitted to all facilities within larger geographic area on       Patient informed that his/her managed care company has contracts with or will negotiate with certain facilities, including the following:            Patient/family informed of bed offers received.  Patient chooses bed at       Physician recommends and patient chooses bed at      Patient to be transferred to   on  .  Patient to be transferred to facility by       Patient family notified on   of transfer.  Name of family member notified:        PHYSICIAN Please sign FL2     Additional Comment:    _______________________________________________ Cranford Mon, LCSW 01/27/2015, 1:37  PM

## 2015-01-28 ENCOUNTER — Telehealth: Payer: Self-pay | Admitting: Family Medicine

## 2015-01-28 DIAGNOSIS — F419 Anxiety disorder, unspecified: Secondary | ICD-10-CM | POA: Diagnosis not present

## 2015-01-28 DIAGNOSIS — J441 Chronic obstructive pulmonary disease with (acute) exacerbation: Secondary | ICD-10-CM | POA: Diagnosis not present

## 2015-01-28 DIAGNOSIS — I10 Essential (primary) hypertension: Secondary | ICD-10-CM | POA: Diagnosis not present

## 2015-01-28 DIAGNOSIS — J449 Chronic obstructive pulmonary disease, unspecified: Secondary | ICD-10-CM | POA: Diagnosis not present

## 2015-01-28 DIAGNOSIS — Q2733 Arteriovenous malformation of digestive system vessel: Secondary | ICD-10-CM | POA: Diagnosis not present

## 2015-01-28 DIAGNOSIS — Z9981 Dependence on supplemental oxygen: Secondary | ICD-10-CM | POA: Diagnosis not present

## 2015-01-28 DIAGNOSIS — E118 Type 2 diabetes mellitus with unspecified complications: Secondary | ICD-10-CM | POA: Diagnosis not present

## 2015-01-28 DIAGNOSIS — D509 Iron deficiency anemia, unspecified: Secondary | ICD-10-CM | POA: Diagnosis not present

## 2015-01-28 DIAGNOSIS — J9611 Chronic respiratory failure with hypoxia: Secondary | ICD-10-CM | POA: Diagnosis not present

## 2015-01-28 DIAGNOSIS — I5032 Chronic diastolic (congestive) heart failure: Secondary | ICD-10-CM | POA: Diagnosis not present

## 2015-01-28 DIAGNOSIS — I501 Left ventricular failure: Secondary | ICD-10-CM | POA: Diagnosis not present

## 2015-01-28 DIAGNOSIS — N289 Disorder of kidney and ureter, unspecified: Secondary | ICD-10-CM | POA: Diagnosis not present

## 2015-01-28 DIAGNOSIS — D61818 Other pancytopenia: Secondary | ICD-10-CM | POA: Diagnosis not present

## 2015-01-28 DIAGNOSIS — N3000 Acute cystitis without hematuria: Secondary | ICD-10-CM | POA: Diagnosis not present

## 2015-01-28 DIAGNOSIS — N390 Urinary tract infection, site not specified: Secondary | ICD-10-CM

## 2015-01-28 DIAGNOSIS — K7460 Unspecified cirrhosis of liver: Secondary | ICD-10-CM | POA: Diagnosis not present

## 2015-01-28 DIAGNOSIS — M6281 Muscle weakness (generalized): Secondary | ICD-10-CM | POA: Diagnosis not present

## 2015-01-28 DIAGNOSIS — E1122 Type 2 diabetes mellitus with diabetic chronic kidney disease: Secondary | ICD-10-CM | POA: Diagnosis not present

## 2015-01-28 DIAGNOSIS — D649 Anemia, unspecified: Secondary | ICD-10-CM | POA: Diagnosis not present

## 2015-01-28 DIAGNOSIS — J9610 Chronic respiratory failure, unspecified whether with hypoxia or hypercapnia: Secondary | ICD-10-CM | POA: Diagnosis not present

## 2015-01-28 DIAGNOSIS — N183 Chronic kidney disease, stage 3 (moderate): Secondary | ICD-10-CM | POA: Diagnosis not present

## 2015-01-28 DIAGNOSIS — R509 Fever, unspecified: Secondary | ICD-10-CM | POA: Diagnosis not present

## 2015-01-28 DIAGNOSIS — I350 Nonrheumatic aortic (valve) stenosis: Secondary | ICD-10-CM | POA: Diagnosis not present

## 2015-01-28 DIAGNOSIS — K754 Autoimmune hepatitis: Secondary | ICD-10-CM | POA: Diagnosis not present

## 2015-01-28 DIAGNOSIS — I517 Cardiomegaly: Secondary | ICD-10-CM | POA: Diagnosis not present

## 2015-01-28 DIAGNOSIS — R0989 Other specified symptoms and signs involving the circulatory and respiratory systems: Secondary | ICD-10-CM | POA: Diagnosis not present

## 2015-01-28 DIAGNOSIS — Z794 Long term (current) use of insulin: Secondary | ICD-10-CM | POA: Diagnosis not present

## 2015-01-28 DIAGNOSIS — N189 Chronic kidney disease, unspecified: Secondary | ICD-10-CM | POA: Diagnosis not present

## 2015-01-28 LAB — GLUCOSE, CAPILLARY
GLUCOSE-CAPILLARY: 209 mg/dL — AB (ref 65–99)
Glucose-Capillary: 173 mg/dL — ABNORMAL HIGH (ref 65–99)
Glucose-Capillary: 344 mg/dL — ABNORMAL HIGH (ref 65–99)
Glucose-Capillary: 441 mg/dL — ABNORMAL HIGH (ref 65–99)

## 2015-01-28 LAB — CBC
HEMATOCRIT: 25.7 % — AB (ref 36.0–46.0)
HEMOGLOBIN: 8.6 g/dL — AB (ref 12.0–15.0)
MCH: 33.3 pg (ref 26.0–34.0)
MCHC: 33.5 g/dL (ref 30.0–36.0)
MCV: 99.6 fL (ref 78.0–100.0)
Platelets: 80 10*3/uL — ABNORMAL LOW (ref 150–400)
RBC: 2.58 MIL/uL — AB (ref 3.87–5.11)
RDW: 15.4 % (ref 11.5–15.5)
WBC: 5.2 10*3/uL (ref 4.0–10.5)

## 2015-01-28 LAB — BASIC METABOLIC PANEL
Anion gap: 9 (ref 5–15)
BUN: 57 mg/dL — ABNORMAL HIGH (ref 6–20)
CHLORIDE: 101 mmol/L (ref 101–111)
CO2: 28 mmol/L (ref 22–32)
Calcium: 9.3 mg/dL (ref 8.9–10.3)
Creatinine, Ser: 1.42 mg/dL — ABNORMAL HIGH (ref 0.44–1.00)
GFR calc non Af Amer: 36 mL/min — ABNORMAL LOW (ref >60)
GFR, EST AFRICAN AMERICAN: 42 mL/min — AB (ref >60)
GLUCOSE: 141 mg/dL — AB (ref 65–99)
Potassium: 3.7 mmol/L (ref 3.5–5.1)
SODIUM: 138 mmol/L (ref 135–145)

## 2015-01-28 MED ORDER — PREDNISONE 20 MG PO TABS: 20.0000 mg | ORAL_TABLET | Freq: Every day | ORAL | Status: DC

## 2015-01-28 MED ORDER — OXYCODONE HCL 5 MG PO TABS: ORAL_TABLET | ORAL | Status: DC

## 2015-01-28 MED ORDER — DOXYCYCLINE HYCLATE 100 MG PO TABS: 100.0000 mg | ORAL_TABLET | Freq: Two times a day (BID) | ORAL | Status: DC

## 2015-01-28 MED ORDER — ALPRAZOLAM 1 MG PO TABS
ORAL_TABLET | ORAL | Status: DC
Start: 2015-01-28 — End: 2015-02-07

## 2015-01-28 NOTE — Clinical Social Work Placement (Signed)
   CLINICAL SOCIAL WORK PLACEMENT  NOTE  Date:  01/28/2015  Patient Details  Name: [name] MRN: [id number] Date of Birth: 06-28-1941  Clinical Social Work is seeking post-discharge placement for this patient at the [place] level of care (*CSW will initial, date and re-position this form in  chart as items are completed):  Yes   Patient/family provided with [place] Work Department's list of facilities offering this level of care within the geographic area requested by the patient (or if unable, by the patient's family).  Yes   Patient/family informed of their freedom to choose among providers that offer the needed level of care, that participate in Medicare, Medicaid or managed care program needed by the patient, have an available bed and are willing to accept the patient.  Yes   Patient/family informed of [place]'s ownership interest in [facility] and [facility], as well as of the fact that they are under no obligation to receive care at these facilities.  PASRR submitted to EDS on 01/27/15     PASRR number received on 01/27/15     Existing PASRR number confirmed on       FL2 transmitted to all facilities in geographic area requested by pt/family on 01/27/15     FL2 transmitted to all facilities within larger geographic area on       Patient informed that his/her managed care company has contracts with or will negotiate with certain facilities, including the following:        Yes   Patient/family informed of bed offers received.  Patient chooses bed at [facility] recommends and patient chooses bed at      Patient to be transferred to [facility] on 01/28/15.  Patient to be transferred to facility by Ambulance     Patient family notified on 01/28/15 of transfer.  Name of family member notified:  Patient states she has called her family herself.      PHYSICIAN Please sign  FL2     Additional Comment:  Per MD patient ready for DC to [facility]. RN, patient, patient's family, and facility notified of DC. RN given number for report. DC packet on chart. Ambulance transport requested for patient. CSW signing off.   _______________________________________________ Liz Beach, MSW, [place], [place] [phone]

## 2015-01-28 NOTE — Discharge Summary (Signed)
Physician Discharge Summary  [name] PFX:[id number] DOB: Sep 18, 1940 DOA: 01/25/2015  PCP: [name] Bush, MD  Admit date: 01/25/2015 Discharge date: 01/28/2015  Time spent: > 30 minutes  Recommendations for Outpatient Follow-up:  1. Follow up with Dr. Danise [name] in 2 weeks 2. Continue Doxycyline for COPD exacerbation for 3 additional days 3. Continue Prednisone taper 20 mg x 3 days then 10 mg x 4 days then stop   Discharge Diagnoses:  Principal Problem:   Chronic obstructive pulmonary disease with acute exacerbation Active Problems:   Aortic valve disorder   GERD   Autoimmune hepatitis   Other pancytopenia   Chronic diastolic CHF (congestive heart failure)   CKD stage 3 due to type 2 diabetes mellitus   Chronic respiratory failure   Acute cystitis without hematuria   UTI (urinary tract infection)   COPD exacerbation   UTI (lower urinary tract infection)   Discharge Condition: stable  Diet recommendation: heart healthy  Filed Weights   01/26/15 0357 01/27/15 0453 01/28/15 0516  Weight: 96.8 kg (213 lb 6.5 oz) 96.4 kg (212 lb 8.4 oz) 95.8 kg (211 lb 3.2 oz)    History of present illness:  [name] is a 74 y.o. female, with a past medical history of [place], diastolic heart failure, portal hypertension, COPD on 3 L of oxygen chronically at home, and aortic stenosis. She presented to the emergency department with a sudden onset of shortness of breath this morning. [name] reports that approximately 2 weeks ago she was placed on Z-Pak and Keflex for a urinary tract infection and possible upper respiratory infection. She was coughing up green sputum at that time. The green sputum improved. However, over the last couple of weeks she hasn't been able to get around her house (from the bathroom to the bedroom) as she did previously. This morning she reports feeling suddenly short of breath, panting and gasping for breath. She complains of being up all night with increased  frequency of urine and a runny nose. In the emergency department the patient appears very anxious breathing 27+ times per minute. She is requiring 4+ liters of oxygen to maintain her oxygen sats. Her UA appears positive for urinary tract infection. We will admit her for COPD exacerbation and UTI.  Hospital Course:  COPD exacerbation - patient was admitted to the hospital with wheezing and shortness of breath. She was started on nebulizers, doxycycline, as well as IV steroids. Her respiratory status improved, and her anti-biotics as well as stairways were converted to by mouth. She has remained stable, her wheezing has completely resolved, and she is back to her baseline. Physical therapy evaluated patient, and recommended skilled nursing, patient agreeable, she will be discharged in stable condition. UTI -  she has a history of recurrent UTIs, has received treatment with fosfomycin, cultures eventually speciated Escherichia coli. Pancytopenia - Likely secondary to NASH and CKD. Stable. CKD - Stable. Creatinine at baseline. NASH - Will continue Lactulose BID, Diuretics, and Nadolol Diastolic CHF - Echo: LVEF 08/14/14 60 - 65%. Mild LVH, Appears euvolemic, Continue diuretics. Daily weights, low salt diet. Diabetes Mellitus - On Sliding scale and lantus, CBGs high due to steroids, will improve as his thyroid are being tapered,  Anxiety - continue home xanax  Procedures:  None    Consultations:  None  Discharge Exam: Filed Vitals:   01/27/15 1400 01/27/15 2011 01/27/15 2049 01/28/15 0516  BP: 155/53  154/39 135/55  Pulse: 50 50 51 49  Temp: 98.5 F (36.9  C)  98.7 F (37.1 C) 97.7 F (36.5 C)  TempSrc: Oral  Oral Oral  Resp:  18 18 18   Weight:    95.8 kg (211 lb 3.2 oz)  SpO2:   98% 99%   General: NAD Cardiovascular: RRR Respiratory: CTA biL  Discharge Instructions     Medication List    TAKE these medications        ALPRAZolam 1 MG tablet  Commonly known as:  XANAX    TAKE 1 TABLET EVERY 8 HOURS AS NEEDED FOR ANXIETY     b complex vitamins tablet  Take 1 tablet by mouth daily.     buPROPion 150 MG 24 hr tablet  Commonly known as:  WELLBUTRIN XL  Take 1 tablet (150 mg total) by mouth daily.     CENTRUM SILVER PO  Take 1 tablet by mouth daily.     darbepoetin 200 MCG/0.4ML Soln injection  Commonly known as:  ARANESP  Inject 200 mcg into the skin every 14 (fourteen) days.     doxycycline 100 MG tablet  Commonly known as:  VIBRA-TABS  Take 1 tablet (100 mg total) by mouth every 12 (twelve) hours. For 3 more days     furosemide 40 MG tablet  Commonly known as:  LASIX  Take 40-80 mg by mouth 2 (two) times daily. 80 mg in the morning and 40 mg in the afternoon     guaiFENesin 600 MG 12 hr tablet  Commonly known as:  MUCINEX  Take 600 mg by mouth 2 (two) times daily. 2 tablet by mouth once daily     Insulin Glargine 100 UNIT/ML Solostar Pen  Commonly known as:  LANTUS SOLOSTAR  Inject 140 Units into the skin every morning.     insulin lispro 100 UNIT/ML KiwkPen  Commonly known as:  HUMALOG  INJECT 5-30 UNITS INTO THE SKIN AT 4PM PER SLIDING SCALE (5 for every 50 over 150)     lactulose (encephalopathy) 10 GM/15ML Soln  Commonly known as:  CHRONULAC  Take 30 mLs (20 g total) by mouth 2 (two) times daily. constipation     levalbuterol 0.63 MG/3ML nebulizer solution  Commonly known as:  XOPENEX  Take 0.63 mg by nebulization every 8 (eight) hours as needed for shortness of breath.     lisinopril 5 MG tablet  Commonly known as:  PRINIVIL,ZESTRIL  Take 5 mg by mouth every evening.     loratadine 10 MG tablet  Commonly known as:  CLARITIN  TAKE 1 TABLET EVERY DAY     nadolol 20 MG tablet  Commonly known as:  CORGARD  TAKE 1/2 TABLET BY MOUTH DAILY     nitroGLYCERIN 0.4 MG SL tablet  Commonly known as:  NITROSTAT  Place 1 tablet (0.4 mg total) under the tongue every 5 (five) minutes as needed for chest pain.     NON FORMULARY  3  liters oxygen 24/7     OCEAN NASAL SPRAY NA  Place 1 spray into the nose 3 (three) times daily as needed (congestion).     ONE TOUCH LANCETS Misc  Use to check sugar 4 times daily and as needed. KG:Y18.56 **ONE TOUCH DELICA**     oxyCODONE 5 MG immediate release tablet  Commonly known as:  Oxy IR/ROXICODONE  Take one tablet by mouth every 4 hours as needed for pain     pantoprazole 40 MG tablet  Commonly known as:  PROTONIX  Take 1 tablet (40 mg total) by mouth 2 (  two) times daily.     polyethylene glycol packet  Commonly known as:  MIRALAX / GLYCOLAX  Take 17 g by mouth daily as needed for moderate constipation.     predniSONE 20 MG tablet  Commonly known as:  DELTASONE  Take 1 tablet (20 mg total) by mouth daily with breakfast. 1 tablet daily for 3 days then half tablet for 4 days then stop     PRISTIQ 50 MG 24 hr tablet  Generic drug:  desvenlafaxine  TAKE 3 TABLETS BY MOUTH EVERY DAY     PROAIR HFA 108 (90 BASE) MCG/ACT inhaler  Generic drug:  albuterol  Inhale 2 puffs into the lungs every 6 (six) hours as needed for wheezing or shortness of breath.     promethazine 25 MG tablet  Commonly known as:  PHENERGAN  Take 1 tablet (25 mg total) by mouth 2 (two) times daily as needed for nausea.     QUEtiapine 200 MG tablet  Commonly known as:  SEROQUEL  Take 1 tablet (200 mg total) by mouth at bedtime.     rOPINIRole 4 MG tablet  Commonly known as:  REQUIP  Take 1 tablet (4 mg total) by mouth at bedtime.     simvastatin 40 MG tablet  Commonly known as:  ZOCOR  TAKE 1 TABLET BY MOUTH EVERY EVENING     spironolactone 25 MG tablet  Commonly known as:  ALDACTONE  TAKE 1/2 TABLET BY MOUTH DAILY     Vitamin D (Ergocalciferol) 50000 UNITS Caps capsule  Commonly known as:  DRISDOL  TAKE 1 CAPSULE (50,000 UNITS TOTAL) BY MOUTH EVERY 7 (SEVEN) DAYS. TAKE ON FRIDAY     XIFAXAN 550 MG Tabs tablet  Generic drug:  rifaximin  TAKE 1 TABLET (550 MG TOTAL) BY MOUTH DAILY.             Follow-up Information    Follow up with [name] Bush, MD. Schedule an appointment as soon as possible for a visit in 2 weeks.   Specialty:  Family Medicine   Contact information:   [place] [phone]       The results of significant diagnostics from this hospitalization (including imaging, microbiology, ancillary and laboratory) are listed below for reference.    Significant Diagnostic Studies: Dg Chest 2 View  01/25/2015   CLINICAL DATA:  Progressive shortness of breath  EXAM: CHEST  2 VIEW  COMPARISON:  December 05, 2014  FINDINGS: There is stable scarring in the left mid lung as well as in the right perihilar regions. There is no edema or consolidation. No new opacity. Heart is upper normal in size with pulmonary vascularity within normal limits. No adenopathy. No bone lesions.  IMPRESSION: Stable areas of scarring. No edema or consolidation. No change in cardiac silhouette.   Electronically Signed   By: Lowella Grip III M.D.   On: 01/25/2015 09:11    Microbiology: Recent Results (from the past 240 hour(s))  Urine culture     Status: None   Collection Time: 01/25/15  9:16 AM  Result Value Ref Range Status   Specimen Description URINE, RANDOM  Final   Special Requests NONE  Final   Colony Count   Final    >=100,000 COLONIES/ML Performed at [facility]    Culture   Final    ESCHERICHIA COLI Performed at [facility]    Report Status 01/27/2015 FINAL  Final   Organism ID, Bacteria ESCHERICHIA COLI  Final      Susceptibility   Escherichia coli - MIC*    AMPICILLIN >=32 RESISTANT Resistant     CEFAZOLIN 8 SENSITIVE Sensitive     CEFTRIAXONE <=1 SENSITIVE Sensitive     CIPROFLOXACIN >=4 RESISTANT Resistant     GENTAMICIN 2 SENSITIVE Sensitive     LEVOFLOXACIN >=8 RESISTANT Resistant     NITROFURANTOIN <=16 SENSITIVE Sensitive     TOBRAMYCIN <=1 SENSITIVE Sensitive     TRIMETH/SULFA <=20 SENSITIVE Sensitive      PIP/TAZO <=4 SENSITIVE Sensitive     * ESCHERICHIA COLI     Labs: Basic Metabolic Panel:  Recent Labs Lab 01/25/15 0817 01/25/15 2200 01/26/15 0530 01/28/15 0255  NA 135 130* 133* 138  K 4.6 5.7* 4.5 3.7  CL 99* 94* 98* 101  CO2 26 21* 20* 28  GLUCOSE 323* 546* 397* 141*  BUN 74* 66* 63* 57*  CREATININE 1.61* 1.68* 1.73* 1.42*  CALCIUM 9.6 9.4 9.3 9.3   Liver Function Tests:  Recent Labs Lab 01/25/15 0817 01/26/15 0530  AST 30 34  ALT 27 28  ALKPHOS 99 97  BILITOT 1.3* 0.9  PROT 6.3* 6.6  ALBUMIN 3.3* 3.2*   CBC:  Recent Labs Lab 01/25/15 0817 01/26/15 0530 01/28/15 0255  WBC 3.8* 5.7 5.2  NEUTROABS 3.0  --   --   HGB 8.7* 8.5* 8.6*  HCT 26.3* 25.4* 25.7*  MCV 99.6 99.6 99.6  PLT 108* 100* 80*   Cardiac Enzymes:  Recent Labs Lab 01/25/15 0817 01/25/15 1937  TROPONINI <0.03 <0.03   BNP: BNP (last 3 results)  Recent Labs  08/29/14 1820 01/25/15 0817  BNP 233.0* 95.5    ProBNP (last 3 results)  Recent Labs  03/26/14 0259 08/11/14 1755  PROBNP 3352.0* 226.4*    CBG:  Recent Labs Lab 01/27/15 1200 01/27/15 1434 01/27/15 2045 01/28/15 0553 01/28/15 1059  GLUCAP 393* 386* 275* 173* 209*    Signed:  Jionni Helming  [facility] 01/28/2015, 11:07 AM

## 2015-01-28 NOTE — Progress Notes (Signed)
Report called to [place], [employer].

## 2015-01-28 NOTE — Discharge Instructions (Signed)
Follow with Ria Bush, MD in 5-7 days  Please get a complete blood count and chemistry panel checked by your Primary MD at your next visit, and again as instructed by your Primary MD. Please get your medications reviewed and adjusted by your Primary MD.  Please request your Primary MD to go over all Hospital Tests and Procedure/Radiological results at the follow up, please get all Hospital records sent to your Prim MD by signing hospital release before you go home.  If you had Pneumonia of Lung problems at the Hospital: Please get a 2 view Chest X ray done in 6-8 weeks after hospital discharge or sooner if instructed by your Primary MD.  If you have Congestive Heart Failure: Please call your Cardiologist or Primary MD anytime you have any of the following symptoms:  1) 3 pound weight gain in 24 hours or 5 pounds in 1 week  2) shortness of breath, with or without a dry hacking cough  3) swelling in the hands, feet or stomach  4) if you have to sleep on extra pillows at night in order to breathe  Follow cardiac low salt diet and 1.5 lit/day fluid restriction.  If you have diabetes Accuchecks 4 times/day, Once in AM empty stomach and then before each meal. Log in all results and show them to your primary doctor at your next visit. If any glucose reading is under 80 or above 300 call your primary MD immediately.  If you have Seizure/Convulsions/Epilepsy: Please do not drive, operate heavy machinery, participate in activities at heights or participate in high speed sports until you have seen by Primary MD or a Neurologist and advised to do so again.  If you had Gastrointestinal Bleeding: Please ask your Primary MD to check a complete blood count within one week of discharge or at your next visit. Your endoscopic/colonoscopic biopsies that are pending at the time of discharge, will also need to followed by your Primary MD.  Get Medicines reviewed and adjusted. Please take all your  medications with you for your next visit with your Primary MD  Please request your Primary MD to go over all hospital tests and procedure/radiological results at the follow up, please ask your Primary MD to get all Hospital records sent to his/her office.  If you experience worsening of your admission symptoms, develop shortness of breath, life threatening emergency, suicidal or homicidal thoughts you must seek medical attention immediately by calling 911 or calling your MD immediately  if symptoms less severe.  You must read complete instructions/literature along with all the possible adverse reactions/side effects for all the Medicines you take and that have been prescribed to you. Take any new Medicines after you have completely understood and accpet all the possible adverse reactions/side effects.   Do not drive or operate heavy machinery when taking Pain medications.   Do not take more than prescribed Pain, Sleep and Anxiety Medications  Special Instructions: If you have smoked or chewed Tobacco  in the last 2 yrs please stop smoking, stop any regular Alcohol  and or any Recreational drug use.  Wear Seat belts while driving.  Please note You were cared for by a hospitalist during your hospital stay. If you have any questions about your discharge medications or the care you received while you were in the hospital after you are discharged, you can call the unit and asked to speak with the hospitalist on call if the hospitalist that took care of you is not available. Once  you are discharged, your primary care physician will handle any further medical issues. Please note that NO REFILLS for any discharge medications will be authorized once you are discharged, as it is imperative that you return to your primary care physician (or establish a relationship with a primary care physician if you do not have one) for your aftercare needs so that they can reassess your need for medications and monitor your  lab values.  You can reach the hospitalist office at phone [phone] or fax [phone]   If you do not have a primary care physician, you can call [phone] for a physician referral.  Activity: As tolerated with Full fall precautions use walker/cane & assistance as needed  Diet: heart healthy  Disposition SNF

## 2015-01-28 NOTE — Telephone Encounter (Signed)
plz call for hosp f/u phone call on Mon or Tuesday. Discharged over weekend with COPD exacerbation.

## 2015-01-30 ENCOUNTER — Non-Acute Institutional Stay (SKILLED_NURSING_FACILITY): Payer: Medicare Other | Admitting: Internal Medicine

## 2015-01-30 ENCOUNTER — Encounter: Payer: Self-pay | Admitting: Internal Medicine

## 2015-01-30 DIAGNOSIS — D61818 Other pancytopenia: Secondary | ICD-10-CM | POA: Diagnosis not present

## 2015-01-30 DIAGNOSIS — J441 Chronic obstructive pulmonary disease with (acute) exacerbation: Secondary | ICD-10-CM

## 2015-01-30 DIAGNOSIS — E1122 Type 2 diabetes mellitus with diabetic chronic kidney disease: Secondary | ICD-10-CM | POA: Diagnosis not present

## 2015-01-30 DIAGNOSIS — I5032 Chronic diastolic (congestive) heart failure: Secondary | ICD-10-CM | POA: Diagnosis not present

## 2015-01-30 DIAGNOSIS — IMO0002 Reserved for concepts with insufficient information to code with codable children: Secondary | ICD-10-CM

## 2015-01-30 DIAGNOSIS — N3000 Acute cystitis without hematuria: Secondary | ICD-10-CM

## 2015-01-30 DIAGNOSIS — E1165 Type 2 diabetes mellitus with hyperglycemia: Secondary | ICD-10-CM

## 2015-01-30 DIAGNOSIS — E118 Type 2 diabetes mellitus with unspecified complications: Secondary | ICD-10-CM

## 2015-01-30 DIAGNOSIS — J9611 Chronic respiratory failure with hypoxia: Secondary | ICD-10-CM | POA: Diagnosis not present

## 2015-01-30 DIAGNOSIS — N183 Chronic kidney disease, stage 3 (moderate): Secondary | ICD-10-CM | POA: Diagnosis not present

## 2015-01-30 NOTE — Assessment & Plan Note (Signed)
Back to baseline on O2 3L

## 2015-01-30 NOTE — Progress Notes (Addendum)
MRN: [id number] Name: ATOYA [name]  Sex: female Age: 74 y.o. DOB: 11/26/1940  [place] #: [facility] Facility/Room:111 Level Of Care: SNF Provider: Inocencio Homes D Emergency Contacts: Extended Emergency Contact Information Primary Emergency Contact: Amyre, Segundo Address: [address], [place] [address] [place] Phone: [phone] Relation: Spouse Secondary Emergency Contact: Jen Mow, [place] [address] [place] Phone: [phone] Relation: Sister  Code Status:   Allergies: Acetaminophen; Aspirin; Theophylline; and Penicillins  Chief Complaint  Patient presents with  . New Admit To SNF    HPI: Patient is 74 y.o. female  with a past medical history of [place], diastolic heart failure, portal hypertension, COPD on 3 L of oxygen chronically [name], and aortic stenosis. She has been hospitalized for a COPD exacerbation and a UTI from 6/1 to 6/4. She is now admitted to SNF for therapy for generalized weakness 2/2 illness and hospitalization . [name] she will be followed for hyperlipidemia txed with zocor, DM 2 tx with lispro insulin and humalog and CHF managed with lasix and corgard.  Past Medical History  Diagnosis Date  . Chronic airway obstruction, not elsewhere classified   . Unspecified chronic bronchitis   . Unspecified essential hypertension   . Non-obstructive CAD   . Palpitations   . Mild aortic stenosis     a. 03/2014 Valve area (VTI): 2.89 cm^2, Valve area (Vmax): 2.7 cm^2.  . Pure hypercholesterolemia   . Morbid obesity   . Esophageal reflux   . GAVE (gastric antral vascular ectasia)     angiodysplasia  . Diverticulosis of colon (without mention of hemorrhage)   . Benign neoplasm of colon   . Osteoarthrosis, unspecified whether generalized or localized, unspecified site   . Osteoporosis, unspecified   . Restless legs syndrome (RLS)   . Major depressive disorder, recurrent episode,  severe, specified as with psychotic behavior   . Iron deficiency anemia secondary to blood loss (chronic)     a. frequent PRBC transfusions.  . Coarse tremors     a. arms.  . Carpal tunnel syndrome on both sides   . Chronic diastolic CHF (congestive heart failure)     a. 03/26/2014 Echo: EF 55-60%, no rwma, mild AS/AI, mod-sev Ca2+ MV annulus, mildly to mod dil LA.  [name] Asthma   . Type II diabetes mellitus   . H/O hiatal hernia   . 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 dx'd 1990's  . Adenomatous colon polyp   . Midsternal chest pain     a. conservatively managed ->poor candidate for cath/anticoagulation.  [name] Anxiety   . Portal hypertensive gastropathy   . Falls frequently     completed HHPT/OT 06/2014, PT unmet goals  . Recurrent UTI (urinary tract infection)     h/o hospitalization with urosepsis 2015, but thought large component colonization/bacteriuria, only treat if symptomatic ([place])  . Hiatal hernia   . Thrombocytopenia   . Protein calorie malnutrition   . History of pneumonia     CAP 2015  . On home oxygen therapy     "3L; 24/7" (01/25/2015)  . OSA (obstructive sleep apnea)     a. does not use CPAP. (01/25/2015)  . GAVE (gastric antral vascular ectasia)     Past Surgical History  Procedure Laterality Date  . Hemorroidectomy    . Appendectomy    .  Vaginal hysterectomy    . Breast biopsy      bilateral  . Cesarean section      x 3  . Appendectomy    . Esophagogastroduodenoscopy  06/12/2012    Procedure: ESOPHAGOGASTRODUODENOSCOPY (EGD);  Surgeon: Lafayette Dragon, MD;  Location: [facility];  Service: Endoscopy;  Laterality: N/A;  . Balloon dilation  06/12/2012    Procedure: BALLOON DILATION;  Surgeon: Lafayette Dragon, MD;  Location: [facility];  Service: Endoscopy;  Laterality: N/A;  ?balloon  . Esophagogastroduodenoscopy  09/10/2012    Procedure: ESOPHAGOGASTRODUODENOSCOPY (EGD);  Surgeon: Lafayette Dragon, MD;  Location: [facility];   Service: Endoscopy;  Laterality: N/A;  . Hot hemostasis  09/10/2012    Procedure: HOT HEMOSTASIS (ARGON PLASMA COAGULATION/BICAP);  Surgeon: Lafayette Dragon, MD;  Location: [facility];  Service: Endoscopy;  Laterality: N/A;  . Esophagogastroduodenoscopy N/A 01/18/2013    Procedure: ESOPHAGOGASTRODUODENOSCOPY (EGD);  Surgeon: Lafayette Dragon, MD;  Location: [facility];  Service: Endoscopy;  Laterality: N/A;  . Esophagogastroduodenoscopy N/A 05/24/2013    Procedure: ESOPHAGOGASTRODUODENOSCOPY (EGD);  Surgeon: Jerene Bears, MD;  Location: [facility];  Service: Gastroenterology;  Laterality: N/A;  . Hot hemostasis N/A 05/24/2013    Procedure: HOT HEMOSTASIS (ARGON PLASMA COAGULATION/BICAP);  Surgeon: Jerene Bears, MD;  Location: [facility];  Service: Gastroenterology;  Laterality: N/A;  . US echocardiography  07/2014    mild LVH, EF 60-65%, normal wall motion, mild AR, mod dilated LA, mildly dilated RA, peak PA pressure 22mmHg  . Esophagogastroduodenoscopy N/A 10/20/2014    Procedure: ESOPHAGOGASTRODUODENOSCOPY (EGD);  Surgeon: Lafayette Dragon, MD;  Location: [facility];  Service: Endoscopy;  Laterality: N/A;  . Hot hemostasis N/A 10/20/2014    Procedure: HOT HEMOSTASIS (ARGON PLASMA COAGULATION/BICAP);  Surgeon: Lafayette Dragon, MD;  Location: [facility];  Service: Endoscopy;  Laterality: N/A;  . Enteroscopy N/A 02/25/2015    Procedure: ENTEROSCOPY;  Surgeon: [name] Ada, MD;  Location: [facility];  Service: Endoscopy;  Laterality: N/A;      Medication List       This list is accurate as of: 01/30/15 11:59 PM.  Always use your most recent med list.               ADVAIR DISKUS 250-50 MCG/DOSE Aepb  Generic drug:  Fluticasone-Salmeterol  Inhale 1 puff into the lungs 2 (two) times daily.     ALPRAZolam 1 MG tablet  Commonly known as:  XANAX  TAKE 1 TABLET EVERY 8 HOURS AS NEEDED FOR ANXIETY     b complex vitamins tablet  Take 1 tablet by mouth daily.     buPROPion 150 MG 24 hr tablet   Commonly known as:  WELLBUTRIN XL  Take 1 tablet (150 mg total) by mouth daily.     CENTRUM SILVER PO  Take 1 tablet by mouth daily.     darbepoetin 200 MCG/0.4ML Soln injection  Commonly known as:  ARANESP  Inject 200 mcg into the skin every 14 (fourteen) days.     doxycycline 100 MG tablet  Commonly known as:  VIBRA-TABS  Take 1 tablet (100 mg total) by mouth every 12 (twelve) hours. For 3 more days     furosemide 40 MG tablet  Commonly known as:  LASIX  Take 40 mg by mouth [name] bedtime. 80 mg in the morning and 40 mg in the afternoon     guaiFENesin 600 MG 12 hr tablet  Commonly known as:  MUCINEX  Take 600 mg by  mouth 2 (two) times daily. 2 tablet by mouth once daily     Insulin Glargine 100 UNIT/ML Solostar Pen  Commonly known as:  LANTUS SOLOSTAR  Inject 140 Units into the skin every morning.     insulin lispro 100 UNIT/ML KiwkPen  Commonly known as:  HUMALOG  INJECT 5-30 UNITS INTO THE SKIN [name] 4PM PER SLIDING SCALE (5 for every 50 over 150)     lactulose (encephalopathy) 10 GM/15ML Soln  Commonly known as:  CHRONULAC  Take 30 mLs (20 g total) by mouth 2 (two) times daily. constipation     levalbuterol 0.63 MG/3ML nebulizer solution  Commonly known as:  XOPENEX  Take 0.63 mg by nebulization 3 (three) times daily.     lisinopril 5 MG tablet  Commonly known as:  PRINIVIL,ZESTRIL  Take 5 mg by mouth every evening.     loratadine 10 MG tablet  Commonly known as:  CLARITIN  TAKE 1 TABLET EVERY DAY     mupirocin cream 2 %  Commonly known as:  BACTROBAN  Apply 1 application topically 2 (two) times daily.     nadolol 20 MG tablet  Commonly known as:  CORGARD  TAKE 1/2 TABLET BY MOUTH DAILY     nitroGLYCERIN 0.4 MG SL tablet  Commonly known as:  NITROSTAT  Place 1 tablet (0.4 mg total) under the tongue every 5 (five) minutes as needed for chest pain.     NON FORMULARY  3 liters oxygen 24/7     OCEAN NASAL SPRAY NA  Place 1 spray into the nose 3 (three) times  daily as needed (congestion).     ONE TOUCH LANCETS Misc  Use to check sugar 4 times daily and as needed. SP:Q33.00 **ONE TOUCH DELICA**     oxyCODONE 5 MG immediate release tablet  Commonly known as:  Oxy IR/ROXICODONE  Take one tablet by mouth every 4 hours as needed for pain     pantoprazole 40 MG tablet  Commonly known as:  PROTONIX  Take 1 tablet (40 mg total) by mouth 2 (two) times daily.     polyethylene glycol packet  Commonly known as:  MIRALAX / GLYCOLAX  Take 17 g by mouth daily as needed for moderate constipation.     predniSONE 20 MG tablet  Commonly known as:  DELTASONE  Take 1 tablet (20 mg total) by mouth daily with breakfast. 1 tablet daily for 3 days then half tablet for 4 days then stop     PRISTIQ 50 MG 24 hr tablet  Generic drug:  desvenlafaxine  TAKE 3 TABLETS BY MOUTH EVERY DAY     PROAIR HFA 108 (90 BASE) MCG/ACT inhaler  Generic drug:  albuterol  Inhale 2 puffs into the lungs every 6 (six) hours as needed for wheezing or shortness of breath.     promethazine 25 MG tablet  Commonly known as:  PHENERGAN  Take 1 tablet (25 mg total) by mouth 2 (two) times daily as needed for nausea.     QUEtiapine 200 MG tablet  Commonly known as:  SEROQUEL  Take 1 tablet (200 mg total) by mouth [name] bedtime.     rOPINIRole 4 MG tablet  Commonly known as:  REQUIP  Take 1 tablet (4 mg total) by mouth [name] bedtime.     simvastatin 40 MG tablet  Commonly known as:  ZOCOR  TAKE 1 TABLET BY MOUTH EVERY EVENING     spironolactone 25 MG tablet  Commonly known as:  ALDACTONE  TAKE 1/2 TABLET BY MOUTH DAILY     Vitamin D (Ergocalciferol) 50000 UNITS Caps capsule  Commonly known as:  DRISDOL  TAKE 1 CAPSULE (50,000 UNITS TOTAL) BY MOUTH EVERY 7 (SEVEN) DAYS. TAKE ON FRIDAY     XIFAXAN 550 MG Tabs tablet  Generic drug:  rifaximin  TAKE 1 TABLET (550 MG TOTAL) BY MOUTH DAILY.        No orders of the defined types were placed in this encounter.    Immunization  History  Administered Date(s) Administered  . H1N1 09/06/2008  . Influenza Split 07/30/2011, 05/26/2012  . Influenza Whole 05/19/2008, 06/22/2009, 06/08/2010  . Influenza,inj,Quad PF,36+ Mos 06/10/2013, 05/05/2014  . Pneumococcal Conjugate-13 12/19/2014  . Pneumococcal Polysaccharide-23 08/27/2007    History  Substance Use Topics  . Smoking status: Former Smoker -- 1.50 packs/day for 25 years    Types: Cigarettes    Quit date: 08/26/1992  . Smokeless tobacco: Never Used  . Alcohol Use: No     Comment: 6/56/81 "last alcoholic drink was years ago"    Family history is + DM and heart dx   Review of Systems  DATA OBTAINED: from patient, nurse GENERAL:  no fevers,+ fatigue, appetite changes SKIN: No itching, rash or wounds EYES: No eye pain, redness, discharge EARS: No earache, tinnitus, change in hearing NOSE: No congestion, drainage or bleeding  MOUTH/THROAT: No mouth or tooth pain, No sore throat RESPIRATORY: No cough, wheezing, SOB CARDIAC: No chest pain, palpitations, lower extremity edema  GI: No abdominal pain, No N/V/D or constipation, No heartburn or reflux  GU: No dysuria, frequency or urgency, or incontinence  MUSCULOSKELETAL: No unrelieved bone/joint pain NEUROLOGIC: No headache, dizziness or focal weakness PSYCHIATRIC: No overt anxiety or sadness, No behavior issue.   Filed Vitals:   01/31/15 1913  BP: 113/55  Pulse: 60  Temp: 97.6 F (36.4 C)  Resp: 18    Physical Exam  GENERAL APPEARANCE: Alert, conversant, obeseWF No acute distress.  SKIN: No diaphoresis rash HEAD: Normocephalic, atraumatic  EYES: Conjunctiva/lids clear. Pupils round, reactive. EOMs intact.  EARS: External exam WNL, canals clear. Hearing grossly normal.  NOSE: No deformity or discharge.  MOUTH/THROAT: Lips w/o lesions  RESPIRATORY: Breathing is even, unlabored. Lung sounds are decreased without rales, minimal wheeze   CARDIOVASCULAR: Heart RRR no murmurs, rubs or gallops. No  peripheral edema.   GASTROINTESTINAL: Abdomen is soft, non-tender, not distended w/ normal bowel sounds. GENITOURINARY: Bladder non tender, not distended  MUSCULOSKELETAL: No abnormal joints or musculature NEUROLOGIC:  Cranial nerves 2-12 grossly intact. Moves all extremities  PSYCHIATRIC: Mood and affect appropriate to situation, no behavioral issues  Patient Active Problem List   Diagnosis Date Noted  . Anemia 02/23/2015  . Symptomatic anemia 02/23/2015  . Dehydration   . Dyspnea 01/25/2015  . UTI (urinary tract infection) 01/25/2015  . COPD exacerbation 01/25/2015  . UTI (lower urinary tract infection)   . Acute bronchitis 01/16/2015  . Medicare annual wellness visit, subsequent 12/19/2014  . Health maintenance examination 12/19/2014  . Subconjunctival hemorrhage of right eye 09/22/2014  . Oral thrush 09/22/2014  . Advanced care planning/counseling discussion 08/27/2014  . SOB (shortness of breath) 08/11/2014  . Bradycardia 08/11/2014  . Diabetes mellitus type 2 with complications, uncontrolled 08/11/2014  . Chronic obstructive pulmonary disease with acute exacerbation   . Acute cystitis without hematuria 07/12/2014  . Protein calorie malnutrition   . Chronic respiratory failure 12/02/2013  . Acute renal failure superimposed on stage 3 chronic kidney disease 11/18/2013  .  Hepatic cirrhosis 11/18/2013  . Physical deconditioning 05/25/2013  . Chronic diastolic CHF (congestive heart failure) 03/11/2013  . Falls frequently   . Constipation by delayed colonic transit 02/03/2013  . Other pancytopenia 01/19/2013  . 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 Obesity, Class II, BMI 35-39.9, with comorbidity 10/25/2011  . HYPERCHOLESTEROLEMIA 02/01/2009  . RESTLESS LEGS SYNDROME 02/23/2008  . Recurrent cystitis 02/23/2008  . Coronary atherosclerosis 02/12/2008  . Severe recurrent major depressive disorder with psychotic features 11/09/2007  . Iron deficiency anemia due  to chronic blood loss 08/28/2007  . Aortic valve disorder 08/28/2007  . Autoimmune hepatitis 08/28/2007  . Osteoarthritis 08/28/2007  . Osteoporosis 08/28/2007  . Essential hypertension 06/25/2007  . COPD (chronic obstructive pulmonary disease) with chronic bronchitis 06/25/2007  . GERD 06/25/2007  . Angiodysplasia of intestine 06/25/2007  . Fibromyalgia 06/25/2007   01/25/2015 CLINICAL DATA: Progressive shortness of breath EXAM: CHEST 2 VIEW COMPARISON: December 05, 2014 FINDINGS: There is stable scarring in the left mid lung as well as in the right perihilar regions. There is no edema or consolidation. No new opacity. Heart is upper normal in size with pulmonary vascularity within normal limits. No adenopathy. No bone lesions. IMPRESSION: Stable areas of scarring. No edema or consolidation. No change in cardiac silhouette. Electronically Signed By: Lowella Grip III M.D. On: 01/25/2015 09:11   CBC    Component Value Date/Time   WBC 2.6* 02/28/2015 0631   RBC 2.64* 02/28/2015 0631   RBC 2.48* 08/15/2014 0903   HGB 8.9* 03/03/2015 1301   HGB 7.3 02/21/2015   HCT 25.7* 02/28/2015 0631   PLT 56* 02/28/2015 0631   PLT 67 02/21/2015   MCV 97.3 02/28/2015 0631   LYMPHSABS 0.3* 02/23/2015 1556   MONOABS 0.2 02/23/2015 1556   EOSABS 0.1 02/23/2015 1556   BASOSABS 0.0 02/23/2015 1556    CMP     Component Value Date/Time   NA 139 02/27/2015 0430   NA 136 02/21/2015   K 4.2 02/27/2015 0430   K 4.0 02/21/2015   CL 107 02/27/2015 0430   CO2 26 02/27/2015 0430   GLUCOSE 172* 02/27/2015 0430   GLUCOSE 256* 09/04/2006 1603   BUN 30* 02/27/2015 0430   BUN 78* 02/21/2015   CREATININE 1.27* 02/27/2015 0430   CREATININE 1.77 02/21/2015   CALCIUM 8.8* 02/27/2015 0430   PROT 5.0* 02/24/2015 0813   ALBUMIN 2.8* 02/24/2015 0813   AST 31 02/24/2015 0813   ALT 21 02/24/2015 0813   ALKPHOS 80 02/24/2015 0813   BILITOT 1.1 02/24/2015 0813   GFRNONAA 41* 02/27/2015 0430    GFRAA 48* 02/27/2015 0430    Assessment and Plan  COPD exacerbation - patient was admitted to the hospital with wheezing and shortness of breath. She was txed with nebulizers, doxycycline, as well as IV steroids. 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 Mild LVH,  Continue diuretics and corgard Daily weights, low salt diet.  Chronic respiratory failure Back to baseline on O2 3L  Diabetes mellitus type 2 with complications, uncontrolled On Sliding scale  and lantus, will be titrated actively while on steroids and again after steroids are finished until BS are stable.    Hennie Duos, MD

## 2015-01-30 NOTE — Assessment & Plan Note (Addendum)
Plan BMP in a week to ensure Cr is stable on continued lasix

## 2015-01-30 NOTE — Assessment & Plan Note (Addendum)
Echo: LVEF 08/14/14 60 - 65%. Mild LVH,  Continue diuretics and corgard Daily weights, low salt diet.

## 2015-01-30 NOTE — Assessment & Plan Note (Addendum)
-   patient was admitted to the hospital with wheezing and shortness of breath. She was txed with nebulizers, doxycycline, as well as IV steroids. After admission to SNF pt will continue a predisone taper and nebs will be scheduled until wheezing completely resolved

## 2015-01-30 NOTE — Assessment & Plan Note (Addendum)
2/2 NASH and CKD;Plan CBC in a week to ensure no change as steroids are tapered.

## 2015-01-30 NOTE — Assessment & Plan Note (Addendum)
On Sliding scale and lantus, will be titrated actively while on steroids and again after steroids are finished until BS are stable.

## 2015-01-30 NOTE — Assessment & Plan Note (Signed)
she has a history of recurrent UTIs, has received treatment with fosfomycin, cultures eventually speciated Escherichia coli

## 2015-01-31 ENCOUNTER — Encounter: Payer: Self-pay | Admitting: Internal Medicine

## 2015-01-31 NOTE — Telephone Encounter (Signed)
Spoke with patient. She has been discharged to [facility]. She doesn't know how long she will be there. Her legs are very weak and they are working on OT and PT with her. She will come in for follow up when discharged from SNF.

## 2015-02-01 ENCOUNTER — Telehealth: Payer: Self-pay | Admitting: Pulmonary Disease

## 2015-02-01 NOTE — Telephone Encounter (Signed)
Samples will be left at the front desk. Pt's husband is aware. Nothing further was needed.

## 2015-02-03 ENCOUNTER — Encounter (HOSPITAL_COMMUNITY)
Admission: RE | Admit: 2015-02-03 | Discharge: 2015-02-03 | Disposition: A | Discharge status: Home / Self Care | Payer: Medicare Other | Source: Ambulatory Visit | Attending: Pulmonary Disease | Admitting: Pulmonary Disease

## 2015-02-03 DIAGNOSIS — Q2733 Arteriovenous malformation of digestive system vessel: Secondary | ICD-10-CM | POA: Diagnosis not present

## 2015-02-03 DIAGNOSIS — N289 Disorder of kidney and ureter, unspecified: Secondary | ICD-10-CM | POA: Diagnosis not present

## 2015-02-03 DIAGNOSIS — D649 Anemia, unspecified: Secondary | ICD-10-CM | POA: Diagnosis not present

## 2015-02-03 DIAGNOSIS — K7460 Unspecified cirrhosis of liver: Secondary | ICD-10-CM | POA: Insufficient documentation

## 2015-02-03 LAB — POCT HEMOGLOBIN-HEMACUE: Hemoglobin: 10.2 g/dL — ABNORMAL LOW (ref 12.0–15.0)

## 2015-02-03 MED ORDER — DARBEPOETIN ALFA 200 MCG/0.4ML IJ SOSY: 200.0000 ug | PREFILLED_SYRINGE | INTRAMUSCULAR | Status: DC

## 2015-02-03 MED ORDER — DARBEPOETIN ALFA 200 MCG/0.4ML IJ SOSY
PREFILLED_SYRINGE | INTRAMUSCULAR | Status: AC
  Filled 2015-02-03: qty 0.4

## 2015-02-06 ENCOUNTER — Encounter: Payer: Self-pay | Admitting: *Deleted

## 2015-02-07 ENCOUNTER — Other Ambulatory Visit: Payer: Self-pay | Admitting: Family Medicine

## 2015-02-07 ENCOUNTER — Other Ambulatory Visit: Payer: Self-pay | Admitting: Primary Care

## 2015-02-08 NOTE — Telephone Encounter (Signed)
plz phone in. 

## 2015-02-08 NOTE — Telephone Encounter (Signed)
Rx called in as directed.   

## 2015-02-10 ENCOUNTER — Telehealth: Payer: Self-pay | Admitting: Pulmonary Disease

## 2015-02-10 NOTE — Telephone Encounter (Signed)
Recommendations given to [place], NP.  Nothing further needed.

## 2015-02-10 NOTE — Telephone Encounter (Signed)
Spoke with Theadora Rama NP [facility]. States pt had COPD exacerbation 01/30/15. At this time she has prod cough, SOB and fever of 99.4, diminished lung sounds. She states she is getting 2v CXR, CBC w/diff, CMP, Ammonia level, UA w/C&S. States pt has finished prednisone. Would like to know if you have any recommendations.  SN please advise.

## 2015-02-10 NOTE — Telephone Encounter (Signed)
Per SN: I recommend - 1) Levaquin 750mg  1 po qd x 7 days 2) Advair 222mcg 1 puff BID 3) Xopenex Neb 0.063% TID on a regular basis (needs to stay consistent) 4) Mucinex 600mg  1 po QID with plenty of fluids/water

## 2015-02-15 ENCOUNTER — Telehealth: Payer: Self-pay | Admitting: Pulmonary Disease

## 2015-02-15 NOTE — Telephone Encounter (Signed)
[name] - have you received anything on this patient from Rehab?  Please advise.

## 2015-02-15 NOTE — Telephone Encounter (Signed)
Left message for patient to return call.

## 2015-02-16 ENCOUNTER — Encounter: Payer: Self-pay | Admitting: Internal Medicine

## 2015-02-16 ENCOUNTER — Non-Acute Institutional Stay (SKILLED_NURSING_FACILITY): Payer: Medicare Other | Admitting: Internal Medicine

## 2015-02-16 DIAGNOSIS — N3000 Acute cystitis without hematuria: Secondary | ICD-10-CM

## 2015-02-16 DIAGNOSIS — D61818 Other pancytopenia: Secondary | ICD-10-CM

## 2015-02-16 DIAGNOSIS — J441 Chronic obstructive pulmonary disease with (acute) exacerbation: Secondary | ICD-10-CM | POA: Diagnosis not present

## 2015-02-16 DIAGNOSIS — E118 Type 2 diabetes mellitus with unspecified complications: Secondary | ICD-10-CM | POA: Diagnosis not present

## 2015-02-16 DIAGNOSIS — IMO0002 Reserved for concepts with insufficient information to code with codable children: Secondary | ICD-10-CM

## 2015-02-16 DIAGNOSIS — E1122 Type 2 diabetes mellitus with diabetic chronic kidney disease: Secondary | ICD-10-CM | POA: Diagnosis not present

## 2015-02-16 DIAGNOSIS — N183 Chronic kidney disease, stage 3 (moderate): Secondary | ICD-10-CM | POA: Diagnosis not present

## 2015-02-16 DIAGNOSIS — E1165 Type 2 diabetes mellitus with hyperglycemia: Secondary | ICD-10-CM

## 2015-02-16 DIAGNOSIS — J9611 Chronic respiratory failure with hypoxia: Secondary | ICD-10-CM | POA: Diagnosis not present

## 2015-02-16 DIAGNOSIS — I5032 Chronic diastolic (congestive) heart failure: Secondary | ICD-10-CM

## 2015-02-16 NOTE — Telephone Encounter (Signed)
Attempted to call pt. Could not leave VM. Mailbox full. Will try to call back later

## 2015-02-16 NOTE — Progress Notes (Signed)
MRN: [id number] Name: [name]  Sex: female Age: 74 y.o. DOB: 1941-07-24  [place] [facility]  Facility/Room:111 Level Of Care: SNF Provider: Inocencio Homes D Emergency Contacts: Extended Emergency Contact Information Primary Emergency Contact: Gertie, [place] Address: [address], [place] [address] [place] Phone: [phone] Relation: Spouse Secondary Emergency Contact: Jen Mow, [place] [address] [place] Phone: [phone] Relation: Sister  Code Status: DNR  Allergies: Acetaminophen; Aspirin; Theophylline; and Penicillins  Chief Complaint  Patient presents with  . Discharge Note    HPI: Patient is 74 y.o. female who was admitted to SNF after being hospitalized for scute exacerbation of COPD who is now ready to be d/c from home.  Past Medical History  Diagnosis Date  . Chronic airway obstruction, not elsewhere classified   . Unspecified chronic bronchitis   . Unspecified essential hypertension   . Non-obstructive CAD   . Palpitations   . Mild aortic stenosis     a. 03/2014 Valve area (VTI): 2.89 cm^2, Valve area (Vmax): 2.7 cm^2.  . Pure hypercholesterolemia   . Morbid obesity   . Esophageal reflux   . GAVE (gastric antral vascular ectasia)     angiodysplasia  . Diverticulosis of colon (without mention of hemorrhage)   . Benign neoplasm of colon   . Autoimmune hepatitis   . Osteoarthrosis, unspecified whether generalized or localized, unspecified site   . Osteoporosis, unspecified   . Restless legs syndrome (RLS)   . Major depressive disorder, recurrent episode, severe, specified as with psychotic behavior   . Iron deficiency anemia secondary to blood loss (chronic)     a. frequent PRBC transfusions.  . Coarse tremors     a. arms.  . Carpal tunnel syndrome on both sides   . Palpitations   . Chronic diastolic CHF (congestive heart failure)     a. 03/26/2014 Echo: EF  55-60%, no rwma, mild AS/AI, mod-sev Ca2+ MV annulus, mildly to mod dil LA.  [name] Asthma   . Type II diabetes mellitus   . H/O hiatal hernia   . 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 dx'd 1990's  . Adenomatous colon polyp   . Midsternal chest pain     a. conservatively managed ->poor candidate for cath/anticoagulation.  [name] Anxiety   . Portal hypertensive gastropathy   . Falls frequently     completed HHPT/OT 06/2014, PT unmet goals  . Recurrent UTI (urinary tract infection)     h/o hospitalization with urosepsis 2015, but thought large component colonization/bacteriuria, only treat if symptomatic ([place])  . Hiatal hernia   . Nonalcoholic steatohepatitis (NASH)   . Thrombocytopenia   . Protein calorie malnutrition   . Family history of adverse reaction to anesthesia     Mother and sister were hard to wake up  . History of pneumonia     CAP 2015  . On home oxygen therapy     "3L; 24/7" (01/25/2015)  . OSA (obstructive sleep apnea)     a. does not use CPAP. (01/25/2015)    Past Surgical History  Procedure Laterality Date  . Hemorroidectomy    . Appendectomy    . Vaginal hysterectomy    . Breast biopsy      bilateral  . Cesarean section      x 3  . Appendectomy    .  Esophagogastroduodenoscopy  06/12/2012    Procedure: ESOPHAGOGASTRODUODENOSCOPY (EGD);  Surgeon: Lafayette Dragon, MD;  Location: [facility];  Service: Endoscopy;  Laterality: N/A;  . Balloon dilation  06/12/2012    Procedure: BALLOON DILATION;  Surgeon: Lafayette Dragon, MD;  Location: [facility];  Service: Endoscopy;  Laterality: N/A;  ?balloon  . Esophagogastroduodenoscopy  09/10/2012    Procedure: ESOPHAGOGASTRODUODENOSCOPY (EGD);  Surgeon: Lafayette Dragon, MD;  Location: [facility];  Service: Endoscopy;  Laterality: N/A;  . Hot hemostasis  09/10/2012    Procedure: HOT HEMOSTASIS (ARGON PLASMA COAGULATION/BICAP);  Surgeon: Lafayette Dragon, MD;  Location: [facility];  Service: Endoscopy;   Laterality: N/A;  . Esophagogastroduodenoscopy N/A 01/18/2013    Procedure: ESOPHAGOGASTRODUODENOSCOPY (EGD);  Surgeon: Lafayette Dragon, MD;  Location: [facility] ENDOSCOPY;  Service: Endoscopy;  Laterality: N/A;  . Esophagogastroduodenoscopy N/A 05/24/2013    Procedure: ESOPHAGOGASTRODUODENOSCOPY (EGD);  Surgeon: Jerene Bears, MD;  Location: [facility];  Service: Gastroenterology;  Laterality: N/A;  . Hot hemostasis N/A 05/24/2013    Procedure: HOT HEMOSTASIS (ARGON PLASMA COAGULATION/BICAP);  Surgeon: Jerene Bears, MD;  Location: [facility];  Service: Gastroenterology;  Laterality: N/A;  . US echocardiography  07/2014    mild LVH, EF 60-65%, normal wall motion, mild AR, mod dilated LA, mildly dilated RA, peak PA pressure 48mmHg  . Esophagogastroduodenoscopy N/A 10/20/2014    Procedure: ESOPHAGOGASTRODUODENOSCOPY (EGD);  Surgeon: Lafayette Dragon, MD;  Location: [facility];  Service: Endoscopy;  Laterality: N/A;  . Hot hemostasis N/A 10/20/2014    Procedure: HOT HEMOSTASIS (ARGON PLASMA COAGULATION/BICAP);  Surgeon: Lafayette Dragon, MD;  Location: [facility];  Service: Endoscopy;  Laterality: N/A;      Medication List       This list is accurate as of: 02/16/15  6:06 PM.  Always use your most recent med list.               ADVAIR DISKUS 250-50 MCG/DOSE Aepb  Generic drug:  Fluticasone-Salmeterol  Inhale 1 puff into the lungs 2 (two) times daily.     ALPRAZolam 1 MG tablet  Commonly known as:  XANAX  TAKE 1 TABLET BY MOUTH EVERY 8 HOURS AS NEEDED FOR ANXIETY     b complex vitamins tablet  Take 1 tablet by mouth daily.     buPROPion 150 MG 24 hr tablet  Commonly known as:  WELLBUTRIN XL  Take 1 tablet (150 mg total) by mouth daily.     CENTRUM SILVER PO  Take 1 tablet by mouth daily.     darbepoetin 200 MCG/0.4ML Soln injection  Commonly known as:  ARANESP  Inject 200 mcg into the skin every 14 (fourteen) days.     furosemide 40 MG tablet  Commonly known as:  LASIX  Take 40-80 mg  by mouth 2 (two) times daily. 80 mg in the morning and 40 mg in the afternoon     guaiFENesin 600 MG 12 hr tablet  Commonly known as:  MUCINEX  Take 600 mg by mouth 2 (two) times daily. 2 tablet by mouth once daily     Insulin Glargine 100 UNIT/ML Solostar Pen  Commonly known as:  LANTUS SOLOSTAR  Inject 140 Units into the skin every morning.     insulin lispro 100 UNIT/ML KiwkPen  Commonly known as:  HUMALOG  INJECT 5-30 UNITS INTO THE SKIN AT 4PM PER SLIDING SCALE (5 for every 50 over 150)     lactulose (encephalopathy) 10 GM/15ML Soln  Commonly known as:  CHRONULAC  Take 30 mLs (20 g total) by mouth 2 (two) times daily. constipation     levalbuterol 0.63 MG/3ML nebulizer solution  Commonly known as:  XOPENEX  Take 0.63 mg by nebulization every 8 (eight) hours as needed for shortness of breath.     loratadine 10 MG tablet  Commonly known as:  CLARITIN  TAKE 1 TABLET EVERY DAY     nadolol 20 MG tablet  Commonly known as:  CORGARD  TAKE 1/2 TABLET BY MOUTH DAILY     NON FORMULARY  3 liters oxygen 24/7     OCEAN NASAL SPRAY NA  Place 1 spray into the nose 3 (three) times daily as needed (congestion).     ONE TOUCH LANCETS Misc  Use to check sugar 4 times daily and as needed. CH:Y85.02 **ONE TOUCH DELICA**     oxyCODONE 5 MG immediate release tablet  Commonly known as:  Oxy IR/ROXICODONE  Take one tablet by mouth every 4 hours as needed for pain     pantoprazole 40 MG tablet  Commonly known as:  PROTONIX  Take 1 tablet (40 mg total) by mouth 2 (two) times daily.     PRISTIQ 50 MG 24 hr tablet  Generic drug:  desvenlafaxine  TAKE 3 TABLETS BY MOUTH EVERY DAY     PROAIR HFA 108 (90 BASE) MCG/ACT inhaler  Generic drug:  albuterol  Inhale 2 puffs into the lungs every 6 (six) hours as needed for wheezing or shortness of breath.     promethazine 25 MG tablet  Commonly known as:  PHENERGAN  Take 1 tablet (25 mg total) by mouth 2 (two) times daily as needed for nausea.      QUEtiapine 200 MG tablet  Commonly known as:  SEROQUEL  Take 1 tablet (200 mg total) by mouth at bedtime.     rOPINIRole 4 MG tablet  Commonly known as:  REQUIP  Take 1 tablet (4 mg total) by mouth at bedtime.     simvastatin 40 MG tablet  Commonly known as:  ZOCOR  TAKE 1 TABLET BY MOUTH EVERY EVENING     spironolactone 25 MG tablet  Commonly known as:  ALDACTONE  TAKE 1/2 TABLET BY MOUTH DAILY     Vitamin D (Ergocalciferol) 50000 UNITS Caps capsule  Commonly known as:  DRISDOL  TAKE 1 CAPSULE (50,000 UNITS TOTAL) BY MOUTH EVERY 7 (SEVEN) DAYS. TAKE ON FRIDAY     XIFAXAN 550 MG Tabs tablet  Generic drug:  rifaximin  TAKE 1 TABLET (550 MG TOTAL) BY MOUTH DAILY.        No orders of the defined types were placed in this encounter.    Immunization History  Administered Date(s) Administered  . H1N1 09/06/2008  . Influenza Split 07/30/2011, 05/26/2012  . Influenza Whole 05/19/2008, 06/22/2009, 06/08/2010  . Influenza,inj,Quad PF,36+ Mos 06/10/2013, 05/05/2014  . Pneumococcal Conjugate-13 12/19/2014  . Pneumococcal Polysaccharide-23 08/27/2007    History  Substance Use Topics  . Smoking status: Former Smoker -- 1.50 packs/day for 25 years    Types: Cigarettes    Quit date: 08/26/1992  . Smokeless tobacco: Never Used  . Alcohol Use: No     Comment: 7/74/12 "last alcoholic drink was years ago"    Filed Vitals:   02/16/15 1727  BP: 107/67  Pulse: 55  Temp: 98.1 F (36.7 C)  Resp: 18    Physical Exam  GENERAL APPEARANCE: Alert, conversant. No acute distress.  HEENT: Unremarkable. RESPIRATORY: Breathing is even, unlabored.  Lung sounds are clear   CARDIOVASCULAR: Heart RRR no murmurs, rubs or gallops. No peripheral edema.  GASTROINTESTINAL: Abdomen is soft, non-tender, not distended w/ normal bowel sounds.  NEUROLOGIC: Cranial nerves 2-12 grossly intact. Moves all extremities  Patient Active Problem List   Diagnosis Date Noted  . Dyspnea 01/25/2015  .  UTI (urinary tract infection) 01/25/2015  . COPD exacerbation 01/25/2015  . UTI (lower urinary tract infection)   . Acute bronchitis 01/16/2015  . Medicare annual wellness visit, subsequent 12/19/2014  . Health maintenance examination 12/19/2014  . Subconjunctival hemorrhage of right eye 09/22/2014  . Oral thrush 09/22/2014  . Advanced care planning/counseling discussion 08/27/2014  . SOB (shortness of breath) 08/11/2014  . Bradycardia 08/11/2014  . Diabetes mellitus type 2 with complications, uncontrolled 08/11/2014  . Chronic obstructive pulmonary disease with acute exacerbation   . Acute cystitis without hematuria 07/12/2014  . Protein calorie malnutrition   . Chronic respiratory failure 12/02/2013  . CKD stage 3 due to type 2 diabetes mellitus 11/18/2013  . Hepatic cirrhosis 11/18/2013  . Physical deconditioning 05/25/2013  . Chronic diastolic CHF (congestive heart failure) 03/11/2013  . Falls frequently   . Constipation by delayed colonic transit 02/03/2013  . Other pancytopenia 01/19/2013  . 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 Obesity, Class II, BMI 35-39.9, with comorbidity 10/25/2011  . HYPERCHOLESTEROLEMIA 02/01/2009  . RESTLESS LEGS SYNDROME 02/23/2008  . Recurrent cystitis 02/23/2008  . Coronary atherosclerosis 02/12/2008  . Severe recurrent major depressive disorder with psychotic features 11/09/2007  . Iron deficiency anemia due to chronic blood loss 08/28/2007  . Aortic valve disorder 08/28/2007  . Autoimmune hepatitis 08/28/2007  . Osteoarthritis 08/28/2007  . Osteoporosis 08/28/2007  . Essential hypertension 06/25/2007  . COPD (chronic obstructive pulmonary disease) with chronic bronchitis 06/25/2007  . GERD 06/25/2007  . Angiodysplasia of intestine 06/25/2007  . Fibromyalgia 06/25/2007    CBC    Component Value Date/Time   WBC 5.2 01/28/2015 0255   RBC 2.58* 01/28/2015 0255   RBC 2.48* 08/15/2014 0903   HGB 10.2* 02/03/2015 1246   HCT  25.7* 01/28/2015 0255   PLT 80* 01/28/2015 0255   MCV 99.6 01/28/2015 0255   LYMPHSABS 0.4* 01/25/2015 0817   MONOABS 0.2 01/25/2015 0817   EOSABS 0.2 01/25/2015 0817   BASOSABS 0.0 01/25/2015 0817    CMP     Component Value Date/Time   NA 138 01/28/2015 0255   K 3.7 01/28/2015 0255   CL 101 01/28/2015 0255   CO2 28 01/28/2015 0255   GLUCOSE 141* 01/28/2015 0255   GLUCOSE 256* 09/04/2006 1603   BUN 57* 01/28/2015 0255   CREATININE 1.42* 01/28/2015 0255   CALCIUM 9.3 01/28/2015 0255   PROT 6.6 01/26/2015 0530   ALBUMIN 3.2* 01/26/2015 0530   AST 34 01/26/2015 0530   ALT 28 01/26/2015 0530   ALKPHOS 97 01/26/2015 0530   BILITOT 0.9 01/26/2015 0530   GFRNONAA 36* 01/28/2015 0255   GFRAA 42* 01/28/2015 0255    Assessment and Plan  Pt is d/c to home with HH/OT/PT and she says she has nursing. I have told pt that she needs to have CBC for falling Hemoglobin and BMP for increased Creatinine drawn on Monday 6/27 and sent to her PCP at [facility] and have written those orders on a prescription. Pt has dealt with these problems as an outpt before.  Hennie Duos, MD

## 2015-02-17 NOTE — Telephone Encounter (Signed)
atc pt, vm full X2.  wcb.

## 2015-02-20 ENCOUNTER — Encounter: Payer: Self-pay | Admitting: Internal Medicine

## 2015-02-20 NOTE — Telephone Encounter (Signed)
Called multiple times- difficult to hear patient, having phone troubles Finally spoke with patient, states that someone from rehab should be sending SN some information about her. Not sure what they were sending - more than likely just an update from sessions. Please advise Apolonio Schneiders is you have received anything from [facility] regarding this patient. Thanks.

## 2015-02-21 ENCOUNTER — Telehealth: Payer: Self-pay | Admitting: Pulmonary Disease

## 2015-02-21 ENCOUNTER — Other Ambulatory Visit: Payer: Self-pay | Admitting: *Deleted

## 2015-02-21 ENCOUNTER — Ambulatory Visit: Payer: Medicare Other | Admitting: *Deleted

## 2015-02-21 DIAGNOSIS — N289 Disorder of kidney and ureter, unspecified: Secondary | ICD-10-CM | POA: Diagnosis not present

## 2015-02-21 DIAGNOSIS — N3020 Other chronic cystitis without hematuria: Secondary | ICD-10-CM | POA: Diagnosis not present

## 2015-02-21 LAB — BASIC METABOLIC PANEL
BUN: 78 mg/dL — AB (ref 4–21)
CREATININE: 1.77
Glucose: 230
Potassium: 4 mmol/L
SODIUM: 136

## 2015-02-21 LAB — CBC
HGB: 7.3 g/dL
Platelet: 67
WBC: 3

## 2015-02-21 NOTE — Telephone Encounter (Signed)
Patient was calling about her husband, [name], she said that he needs samples.   See TE 02/21/2015 in [place] chart... Already spoke with Mr. Kamm this morning. Nothing further needed.

## 2015-02-21 NOTE — Telephone Encounter (Signed)
Have not received anything from [facility] in regards to pt or pt's condition. Called and spoke with Thayer Headings with Pulm rehab this morning and was told that pt was not receiving therapy from them.  Pt not in the system when checked    I have also left multiple messages with the pt to call back to discuss what she is referring to.  Pt has yet to call office back to discuss further.  Will keep message open to follow up on

## 2015-02-22 DIAGNOSIS — D61818 Other pancytopenia: Secondary | ICD-10-CM | POA: Diagnosis not present

## 2015-02-22 DIAGNOSIS — F419 Anxiety disorder, unspecified: Secondary | ICD-10-CM | POA: Diagnosis not present

## 2015-02-22 DIAGNOSIS — K7581 Nonalcoholic steatohepatitis (NASH): Secondary | ICD-10-CM | POA: Diagnosis not present

## 2015-02-22 DIAGNOSIS — E119 Type 2 diabetes mellitus without complications: Secondary | ICD-10-CM | POA: Diagnosis not present

## 2015-02-22 DIAGNOSIS — D649 Anemia, unspecified: Secondary | ICD-10-CM | POA: Diagnosis not present

## 2015-02-22 DIAGNOSIS — J441 Chronic obstructive pulmonary disease with (acute) exacerbation: Secondary | ICD-10-CM | POA: Diagnosis not present

## 2015-02-22 DIAGNOSIS — I509 Heart failure, unspecified: Secondary | ICD-10-CM | POA: Diagnosis not present

## 2015-02-22 DIAGNOSIS — N390 Urinary tract infection, site not specified: Secondary | ICD-10-CM | POA: Diagnosis not present

## 2015-02-22 NOTE — Telephone Encounter (Signed)
Called and spoke to pt. Pt stated she was recently d/c from [facility] (rehab) and was informing SN. Pt has an appt on 7.15.16 with SN and denied needing anything in the mean time. Nothing further needed at this time. Will sign off.

## 2015-02-22 NOTE — Progress Notes (Unsigned)
This encounter was created in error - please disregard.

## 2015-02-23 ENCOUNTER — Telehealth: Payer: Self-pay | Admitting: Family Medicine

## 2015-02-23 ENCOUNTER — Telehealth: Payer: Self-pay | Admitting: *Deleted

## 2015-02-23 ENCOUNTER — Encounter: Payer: Self-pay | Admitting: *Deleted

## 2015-02-23 ENCOUNTER — Other Ambulatory Visit: Payer: Self-pay | Admitting: *Deleted

## 2015-02-23 ENCOUNTER — Emergency Department (HOSPITAL_COMMUNITY): Payer: Medicare Other

## 2015-02-23 ENCOUNTER — Encounter (HOSPITAL_COMMUNITY): Payer: Self-pay | Admitting: Emergency Medicine

## 2015-02-23 ENCOUNTER — Inpatient Hospital Stay (HOSPITAL_COMMUNITY)
Admission: EM | Admit: 2015-02-23 | Discharge: 2015-02-28 | DRG: 345 | Disposition: A | Discharge status: Skilled Nursing Facility | Payer: Medicare Other | Attending: Internal Medicine | Admitting: Internal Medicine

## 2015-02-23 DIAGNOSIS — K7469 Other cirrhosis of liver: Secondary | ICD-10-CM

## 2015-02-23 DIAGNOSIS — K31819 Angiodysplasia of stomach and duodenum without bleeding: Principal | ICD-10-CM | POA: Diagnosis present

## 2015-02-23 DIAGNOSIS — K210 Gastro-esophageal reflux disease with esophagitis: Secondary | ICD-10-CM | POA: Diagnosis present

## 2015-02-23 DIAGNOSIS — J449 Chronic obstructive pulmonary disease, unspecified: Secondary | ICD-10-CM | POA: Diagnosis not present

## 2015-02-23 DIAGNOSIS — F419 Anxiety disorder, unspecified: Secondary | ICD-10-CM | POA: Diagnosis not present

## 2015-02-23 DIAGNOSIS — Z794 Long term (current) use of insulin: Secondary | ICD-10-CM

## 2015-02-23 DIAGNOSIS — R531 Weakness: Secondary | ICD-10-CM | POA: Diagnosis not present

## 2015-02-23 DIAGNOSIS — K219 Gastro-esophageal reflux disease without esophagitis: Secondary | ICD-10-CM | POA: Diagnosis present

## 2015-02-23 DIAGNOSIS — I2510 Atherosclerotic heart disease of native coronary artery without angina pectoris: Secondary | ICD-10-CM | POA: Diagnosis not present

## 2015-02-23 DIAGNOSIS — G2581 Restless legs syndrome: Secondary | ICD-10-CM | POA: Diagnosis present

## 2015-02-23 DIAGNOSIS — E785 Hyperlipidemia, unspecified: Secondary | ICD-10-CM | POA: Diagnosis present

## 2015-02-23 DIAGNOSIS — J441 Chronic obstructive pulmonary disease with (acute) exacerbation: Secondary | ICD-10-CM | POA: Diagnosis present

## 2015-02-23 DIAGNOSIS — I10 Essential (primary) hypertension: Secondary | ICD-10-CM | POA: Diagnosis present

## 2015-02-23 DIAGNOSIS — Z820 Family history of epilepsy and other diseases of the nervous system: Secondary | ICD-10-CM

## 2015-02-23 DIAGNOSIS — Z87891 Personal history of nicotine dependence: Secondary | ICD-10-CM | POA: Diagnosis not present

## 2015-02-23 DIAGNOSIS — IMO0001 Reserved for inherently not codable concepts without codable children: Secondary | ICD-10-CM | POA: Diagnosis present

## 2015-02-23 DIAGNOSIS — G4733 Obstructive sleep apnea (adult) (pediatric): Secondary | ICD-10-CM | POA: Diagnosis not present

## 2015-02-23 DIAGNOSIS — D61818 Other pancytopenia: Secondary | ICD-10-CM | POA: Diagnosis present

## 2015-02-23 DIAGNOSIS — K208 Other esophagitis: Secondary | ICD-10-CM | POA: Diagnosis not present

## 2015-02-23 DIAGNOSIS — R001 Bradycardia, unspecified: Secondary | ICD-10-CM | POA: Diagnosis present

## 2015-02-23 DIAGNOSIS — R296 Repeated falls: Secondary | ICD-10-CM | POA: Diagnosis present

## 2015-02-23 DIAGNOSIS — K3189 Other diseases of stomach and duodenum: Secondary | ICD-10-CM | POA: Diagnosis present

## 2015-02-23 DIAGNOSIS — K7460 Unspecified cirrhosis of liver: Secondary | ICD-10-CM | POA: Diagnosis present

## 2015-02-23 DIAGNOSIS — M810 Age-related osteoporosis without current pathological fracture: Secondary | ICD-10-CM | POA: Diagnosis present

## 2015-02-23 DIAGNOSIS — R404 Transient alteration of awareness: Secondary | ICD-10-CM | POA: Diagnosis not present

## 2015-02-23 DIAGNOSIS — I350 Nonrheumatic aortic (valve) stenosis: Secondary | ICD-10-CM | POA: Diagnosis present

## 2015-02-23 DIAGNOSIS — I959 Hypotension, unspecified: Secondary | ICD-10-CM

## 2015-02-23 DIAGNOSIS — R1310 Dysphagia, unspecified: Secondary | ICD-10-CM | POA: Diagnosis present

## 2015-02-23 DIAGNOSIS — J9611 Chronic respiratory failure with hypoxia: Secondary | ICD-10-CM | POA: Diagnosis not present

## 2015-02-23 DIAGNOSIS — I5032 Chronic diastolic (congestive) heart failure: Secondary | ICD-10-CM | POA: Diagnosis present

## 2015-02-23 DIAGNOSIS — Z6835 Body mass index (BMI) 35.0-35.9, adult: Secondary | ICD-10-CM | POA: Diagnosis not present

## 2015-02-23 DIAGNOSIS — E1165 Type 2 diabetes mellitus with hyperglycemia: Secondary | ICD-10-CM | POA: Diagnosis not present

## 2015-02-23 DIAGNOSIS — E1122 Type 2 diabetes mellitus with diabetic chronic kidney disease: Secondary | ICD-10-CM | POA: Diagnosis present

## 2015-02-23 DIAGNOSIS — K7581 Nonalcoholic steatohepatitis (NASH): Secondary | ICD-10-CM | POA: Diagnosis not present

## 2015-02-23 DIAGNOSIS — N183 Chronic kidney disease, stage 3 unspecified: Secondary | ICD-10-CM | POA: Diagnosis present

## 2015-02-23 DIAGNOSIS — I129 Hypertensive chronic kidney disease with stage 1 through stage 4 chronic kidney disease, or unspecified chronic kidney disease: Secondary | ICD-10-CM | POA: Diagnosis present

## 2015-02-23 DIAGNOSIS — E119 Type 2 diabetes mellitus without complications: Secondary | ICD-10-CM | POA: Diagnosis not present

## 2015-02-23 DIAGNOSIS — N179 Acute kidney failure, unspecified: Secondary | ICD-10-CM | POA: Diagnosis not present

## 2015-02-23 DIAGNOSIS — K5520 Angiodysplasia of colon without hemorrhage: Secondary | ICD-10-CM | POA: Diagnosis not present

## 2015-02-23 DIAGNOSIS — F333 Major depressive disorder, recurrent, severe with psychotic symptoms: Secondary | ICD-10-CM | POA: Diagnosis not present

## 2015-02-23 DIAGNOSIS — Z79891 Long term (current) use of opiate analgesic: Secondary | ICD-10-CM | POA: Diagnosis not present

## 2015-02-23 DIAGNOSIS — J9610 Chronic respiratory failure, unspecified whether with hypoxia or hypercapnia: Secondary | ICD-10-CM | POA: Diagnosis not present

## 2015-02-23 DIAGNOSIS — I509 Heart failure, unspecified: Secondary | ICD-10-CM | POA: Diagnosis not present

## 2015-02-23 DIAGNOSIS — E860 Dehydration: Secondary | ICD-10-CM | POA: Diagnosis not present

## 2015-02-23 DIAGNOSIS — D649 Anemia, unspecified: Secondary | ICD-10-CM | POA: Diagnosis not present

## 2015-02-23 DIAGNOSIS — R251 Tremor, unspecified: Secondary | ICD-10-CM | POA: Diagnosis not present

## 2015-02-23 DIAGNOSIS — Z9981 Dependence on supplemental oxygen: Secondary | ICD-10-CM | POA: Diagnosis not present

## 2015-02-23 DIAGNOSIS — N390 Urinary tract infection, site not specified: Secondary | ICD-10-CM | POA: Diagnosis not present

## 2015-02-23 DIAGNOSIS — M6281 Muscle weakness (generalized): Secondary | ICD-10-CM | POA: Diagnosis not present

## 2015-02-23 DIAGNOSIS — K754 Autoimmune hepatitis: Secondary | ICD-10-CM | POA: Diagnosis not present

## 2015-02-23 DIAGNOSIS — E7800 Pure hypercholesterolemia, unspecified: Secondary | ICD-10-CM | POA: Diagnosis present

## 2015-02-23 DIAGNOSIS — K766 Portal hypertension: Secondary | ICD-10-CM | POA: Diagnosis present

## 2015-02-23 DIAGNOSIS — R278 Other lack of coordination: Secondary | ICD-10-CM | POA: Diagnosis not present

## 2015-02-23 DIAGNOSIS — D500 Iron deficiency anemia secondary to blood loss (chronic): Secondary | ICD-10-CM | POA: Diagnosis present

## 2015-02-23 DIAGNOSIS — Z9181 History of falling: Secondary | ICD-10-CM | POA: Diagnosis not present

## 2015-02-23 DIAGNOSIS — J984 Other disorders of lung: Secondary | ICD-10-CM | POA: Diagnosis not present

## 2015-02-23 DIAGNOSIS — M79609 Pain in unspecified limb: Secondary | ICD-10-CM | POA: Diagnosis not present

## 2015-02-23 LAB — I-STAT TROPONIN, ED: TROPONIN I, POC: 0.01 ng/mL (ref 0.00–0.08)

## 2015-02-23 LAB — URINALYSIS, ROUTINE W REFLEX MICROSCOPIC
Bilirubin Urine: NEGATIVE
Glucose, UA: NEGATIVE mg/dL
HGB URINE DIPSTICK: NEGATIVE
Ketones, ur: NEGATIVE mg/dL
NITRITE: NEGATIVE
PROTEIN: NEGATIVE mg/dL
Specific Gravity, Urine: 1.008 (ref 1.005–1.030)
UROBILINOGEN UA: 0.2 mg/dL (ref 0.0–1.0)
pH: 5 (ref 5.0–8.0)

## 2015-02-23 LAB — CBC WITH DIFFERENTIAL/PLATELET
BASOS PCT: 0 % (ref 0–1)
Basophils Absolute: 0 10*3/uL (ref 0.0–0.1)
Eosinophils Absolute: 0.1 10*3/uL (ref 0.0–0.7)
Eosinophils Relative: 6 % — ABNORMAL HIGH (ref 0–5)
HCT: 21.2 % — ABNORMAL LOW (ref 36.0–46.0)
Hemoglobin: 7 g/dL — ABNORMAL LOW (ref 12.0–15.0)
LYMPHS PCT: 14 % (ref 12–46)
Lymphs Abs: 0.3 10*3/uL — ABNORMAL LOW (ref 0.7–4.0)
MCH: 32.9 pg (ref 26.0–34.0)
MCHC: 33 g/dL (ref 30.0–36.0)
MCV: 99.5 fL (ref 78.0–100.0)
MONO ABS: 0.2 10*3/uL (ref 0.1–1.0)
MONOS PCT: 9 % (ref 3–12)
Neutro Abs: 1.6 10*3/uL — ABNORMAL LOW (ref 1.7–7.7)
Neutrophils Relative %: 70 % (ref 43–77)
Platelets: 59 10*3/uL — ABNORMAL LOW (ref 150–400)
RBC: 2.13 MIL/uL — ABNORMAL LOW (ref 3.87–5.11)
RDW: 16.6 % — ABNORMAL HIGH (ref 11.5–15.5)
WBC: 2.3 10*3/uL — ABNORMAL LOW (ref 4.0–10.5)

## 2015-02-23 LAB — POC OCCULT BLOOD, ED: Fecal Occult Bld: NEGATIVE

## 2015-02-23 LAB — BASIC METABOLIC PANEL
Anion gap: 8 (ref 5–15)
BUN: 103 mg/dL — ABNORMAL HIGH (ref 6–20)
CO2: 24 mmol/L (ref 22–32)
CREATININE: 1.89 mg/dL — AB (ref 0.44–1.00)
Calcium: 8.9 mg/dL (ref 8.9–10.3)
Chloride: 105 mmol/L (ref 101–111)
GFR calc Af Amer: 30 mL/min — ABNORMAL LOW (ref >60)
GFR, EST NON AFRICAN AMERICAN: 26 mL/min — AB (ref >60)
GLUCOSE: 131 mg/dL — AB (ref 65–99)
Potassium: 4.1 mmol/L (ref 3.5–5.1)
Sodium: 137 mmol/L (ref 135–145)

## 2015-02-23 LAB — PROTIME-INR
INR: 1.2 (ref 0.00–1.49)
PROTHROMBIN TIME: 15.4 s — AB (ref 11.6–15.2)

## 2015-02-23 LAB — URINE MICROSCOPIC-ADD ON

## 2015-02-23 LAB — PREPARE RBC (CROSSMATCH)

## 2015-02-23 LAB — CBC
HEMATOCRIT: 20.4 % — AB (ref 36.0–46.0)
HEMOGLOBIN: 6.8 g/dL — AB (ref 12.0–15.0)
MCH: 33.2 pg (ref 26.0–34.0)
MCHC: 33.3 g/dL (ref 30.0–36.0)
MCV: 99.5 fL (ref 78.0–100.0)
Platelets: 56 10*3/uL — ABNORMAL LOW (ref 150–400)
RBC: 2.05 MIL/uL — AB (ref 3.87–5.11)
RDW: 16.6 % — AB (ref 11.5–15.5)
WBC: 2 10*3/uL — ABNORMAL LOW (ref 4.0–10.5)

## 2015-02-23 LAB — APTT: aPTT: 32 seconds (ref 24–37)

## 2015-02-23 LAB — BRAIN NATRIURETIC PEPTIDE: B Natriuretic Peptide: 226.5 pg/mL — ABNORMAL HIGH (ref 0.0–100.0)

## 2015-02-23 MED ORDER — SODIUM CHLORIDE 0.9 % IV SOLN: Freq: Once | INTRAVENOUS | Status: AC

## 2015-02-23 MED ORDER — INSULIN ASPART 100 UNIT/ML ~~LOC~~ SOLN
0.0000 [IU] | Freq: Three times a day (TID) | SUBCUTANEOUS | Status: DC
  Administered 2015-02-25 – 2015-02-26 (×2): 3 [IU] via SUBCUTANEOUS
  Administered 2015-02-26: 5 [IU] via SUBCUTANEOUS
  Administered 2015-02-26: 2 [IU] via SUBCUTANEOUS
  Administered 2015-02-27 (×2): 3 [IU] via SUBCUTANEOUS
  Administered 2015-02-27: 5 [IU] via SUBCUTANEOUS
  Administered 2015-02-28: 3 [IU] via SUBCUTANEOUS
  Administered 2015-02-28: 2 [IU] via SUBCUTANEOUS

## 2015-02-23 MED ORDER — ALBUTEROL SULFATE (2.5 MG/3ML) 0.083% IN NEBU: 2.5000 mg | INHALATION_SOLUTION | Freq: Four times a day (QID) | RESPIRATORY_TRACT | Status: DC | PRN

## 2015-02-23 MED ORDER — QUETIAPINE FUMARATE 200 MG PO TABS
200.0000 mg | ORAL_TABLET | Freq: Every day | ORAL | Status: DC
  Administered 2015-02-23 – 2015-02-27 (×5): 200 mg via ORAL
  Filled 2015-02-23: qty 4
  Filled 2015-02-23 (×6): qty 1

## 2015-02-23 MED ORDER — ALBUTEROL SULFATE HFA 108 (90 BASE) MCG/ACT IN AERS: 2.0000 | INHALATION_SPRAY | Freq: Four times a day (QID) | RESPIRATORY_TRACT | Status: DC | PRN

## 2015-02-23 MED ORDER — NADOLOL 20 MG PO TABS
10.0000 mg | ORAL_TABLET | Freq: Every day | ORAL | Status: DC
  Administered 2015-02-24 – 2015-02-28 (×4): 10 mg via ORAL
  Filled 2015-02-23 (×5): qty 1

## 2015-02-23 MED ORDER — SODIUM CHLORIDE 0.9 % IV BOLUS (SEPSIS)
1000.0000 mL | Freq: Once | INTRAVENOUS | Status: AC
  Administered 2015-02-23: 1000 mL via INTRAVENOUS

## 2015-02-23 MED ORDER — SODIUM CHLORIDE 0.9 % IJ SOLN
3.0000 mL | Freq: Two times a day (BID) | INTRAMUSCULAR | Status: DC
  Administered 2015-02-24 – 2015-02-28 (×8): 3 mL via INTRAVENOUS

## 2015-02-23 MED ORDER — ADULT MULTIVITAMIN W/MINERALS CH
1.0000 | ORAL_TABLET | Freq: Every day | ORAL | Status: DC
  Administered 2015-02-24 – 2015-02-28 (×5): 1 via ORAL
  Filled 2015-02-23 (×5): qty 1

## 2015-02-23 MED ORDER — INSULIN GLARGINE 100 UNIT/ML ~~LOC~~ SOLN
100.0000 [IU] | SUBCUTANEOUS | Status: DC
  Administered 2015-02-26 – 2015-02-28 (×3): 100 [IU] via SUBCUTANEOUS
  Filled 2015-02-23 (×6): qty 1

## 2015-02-23 MED ORDER — GUAIFENESIN ER 600 MG PO TB12
600.0000 mg | ORAL_TABLET | Freq: Two times a day (BID) | ORAL | Status: DC
  Administered 2015-02-23 – 2015-02-28 (×10): 600 mg via ORAL
  Filled 2015-02-23 (×12): qty 1

## 2015-02-23 MED ORDER — PANTOPRAZOLE SODIUM 40 MG IV SOLR
40.0000 mg | Freq: Two times a day (BID) | INTRAVENOUS | Status: DC
  Administered 2015-02-23: 40 mg via INTRAVENOUS
  Filled 2015-02-23 (×3): qty 40

## 2015-02-23 MED ORDER — SALINE NASAL SPRAY 0.65 % NA SOLN
1.0000 | Freq: Three times a day (TID) | NASAL | Status: DC | PRN
  Filled 2015-02-23: qty 45

## 2015-02-23 MED ORDER — ONDANSETRON HCL 4 MG/2ML IJ SOLN: 4.0000 mg | Freq: Four times a day (QID) | INTRAMUSCULAR | Status: DC | PRN

## 2015-02-23 MED ORDER — SODIUM CHLORIDE 0.9 % IV SOLN
INTRAVENOUS | Status: DC
  Administered 2015-02-23: 17:00:00 via INTRAVENOUS

## 2015-02-23 MED ORDER — OXYCODONE HCL 5 MG PO TABS
5.0000 mg | ORAL_TABLET | ORAL | Status: DC | PRN
  Administered 2015-02-26 – 2015-02-27 (×7): 5 mg via ORAL
  Filled 2015-02-23 (×7): qty 1

## 2015-02-23 MED ORDER — SODIUM CHLORIDE 0.9 % IV BOLUS (SEPSIS)
1000.0000 mL | Freq: Once | INTRAVENOUS | Status: AC
Start: 2015-02-23 — End: 2015-02-23
  Administered 2015-02-23: 1000 mL via INTRAVENOUS

## 2015-02-23 MED ORDER — ALPRAZOLAM 0.5 MG PO TABS
1.0000 mg | ORAL_TABLET | Freq: Three times a day (TID) | ORAL | Status: DC | PRN
  Administered 2015-02-24 – 2015-02-27 (×6): 1 mg via ORAL
  Filled 2015-02-23 (×6): qty 2

## 2015-02-23 MED ORDER — BUPROPION HCL ER (XL) 150 MG PO TB24
150.0000 mg | ORAL_TABLET | Freq: Every day | ORAL | Status: DC
  Administered 2015-02-24 – 2015-02-28 (×5): 150 mg via ORAL
  Filled 2015-02-23 (×5): qty 1

## 2015-02-23 MED ORDER — LEVALBUTEROL HCL 0.63 MG/3ML IN NEBU
0.6300 mg | INHALATION_SOLUTION | Freq: Three times a day (TID) | RESPIRATORY_TRACT | Status: DC
  Administered 2015-02-24 (×3): 0.63 mg via RESPIRATORY_TRACT
  Filled 2015-02-23 (×6): qty 3

## 2015-02-23 MED ORDER — ROPINIROLE HCL 1 MG PO TABS
4.0000 mg | ORAL_TABLET | Freq: Every day | ORAL | Status: DC
  Administered 2015-02-23 – 2015-02-27 (×5): 4 mg via ORAL
  Filled 2015-02-23 (×7): qty 4

## 2015-02-23 MED ORDER — MOMETASONE FURO-FORMOTEROL FUM 100-5 MCG/ACT IN AERO
2.0000 | INHALATION_SPRAY | Freq: Two times a day (BID) | RESPIRATORY_TRACT | Status: DC
  Administered 2015-02-23 – 2015-02-28 (×10): 2 via RESPIRATORY_TRACT
  Filled 2015-02-23: qty 8.8

## 2015-02-23 MED ORDER — SIMVASTATIN 40 MG PO TABS
40.0000 mg | ORAL_TABLET | Freq: Every evening | ORAL | Status: DC
  Administered 2015-02-24 – 2015-02-27 (×4): 40 mg via ORAL
  Filled 2015-02-23 (×5): qty 1

## 2015-02-23 MED ORDER — LACTULOSE 10 GM/15ML PO SOLN: 20.0000 g | Freq: Every day | ORAL | Status: DC | PRN

## 2015-02-23 MED ORDER — PANTOPRAZOLE SODIUM 40 MG IV SOLR
40.0000 mg | Freq: Once | INTRAVENOUS | Status: AC
  Administered 2015-02-23: 40 mg via INTRAVENOUS
  Filled 2015-02-23: qty 40

## 2015-02-23 MED ORDER — SODIUM CHLORIDE 0.9 % IV SOLN
INTRAVENOUS | Status: DC
  Administered 2015-02-24: 03:00:00 via INTRAVENOUS

## 2015-02-23 MED ORDER — NADOLOL 20 MG PO TABS: 10.0000 mg | ORAL_TABLET | Freq: Every day | ORAL | Status: DC

## 2015-02-23 MED ORDER — RIFAXIMIN 550 MG PO TABS
550.0000 mg | ORAL_TABLET | Freq: Every day | ORAL | Status: DC
  Administered 2015-02-24 – 2015-02-27 (×4): 550 mg via ORAL
  Filled 2015-02-23 (×4): qty 1

## 2015-02-23 MED ORDER — ONDANSETRON HCL 4 MG PO TABS: 4.0000 mg | ORAL_TABLET | Freq: Four times a day (QID) | ORAL | Status: DC | PRN

## 2015-02-23 MED ORDER — LORATADINE 10 MG PO TABS
10.0000 mg | ORAL_TABLET | Freq: Every day | ORAL | Status: DC
  Administered 2015-02-24 – 2015-02-28 (×5): 10 mg via ORAL
  Filled 2015-02-23 (×5): qty 1

## 2015-02-23 MED ORDER — B COMPLEX PO TABS
1.0000 | ORAL_TABLET | Freq: Every day | ORAL | Status: DC
  Administered 2015-02-24 – 2015-02-28 (×5): 1 via ORAL
  Filled 2015-02-23 (×7): qty 1

## 2015-02-23 MED ORDER — DESVENLAFAXINE SUCCINATE ER 100 MG PO TB24
150.0000 mg | ORAL_TABLET | Freq: Every day | ORAL | Status: DC
  Administered 2015-02-24 – 2015-02-28 (×5): 150 mg via ORAL
  Filled 2015-02-23 (×5): qty 1

## 2015-02-23 NOTE — Telephone Encounter (Signed)
[name] called back and said she's sending patient to [place] phone number is [phone].

## 2015-02-23 NOTE — ED Notes (Signed)
Pt discharged from rehab facility last Friday. Pt's family and home health reports that pt has become more weak- unable to get out of bed. Home health drew blood work- showing hgb 7.3. Pt has hx of anemia and has had to have transfusions in the past. Pt denies any rectal bleeding. HR 53  BP 105/56  sats 100% on 3LNC. Pt wears this at home as well

## 2015-02-23 NOTE — Care Management Note (Signed)
Case Management Note  Patient Details  Name: [name] MRN: [id number] Date of Birth: [birth year]  Subjective/Objective:                   c/o weakness HGB 7.0  Action/Plan: Patient followed by  [facility] recommendation for SNF, spoke with patient she agrees she is needing a higher level of care   Expected Discharge Date:                  Expected Discharge Plan:  [place]  In-House Referral:     Discharge planning Services  CM Consult  Post Acute Care Choice:    Choice offered to:     DME Arranged:    DME Agency:     HH Arranged:    [place] Agency:  [facility]  Status of Service:  Completed, signed off  Medicare Important Message Given:    Date Medicare IM Given:    Medicare IM give by:    Date Additional Medicare IM Given:    Additional Medicare Important Message give by:     If discussed at [place] of Stay Meetings, dates discussed:    Additional Comments: ED CM spoke with [facility] Liaison concerning patient she stated that recommendation as outreach person was for SNF due to care needs. Discussed with patient she is agreeable. Patient presented to [facility] ED with weakness upon ED eval. Hgb 7.0, SOB, HR in 50's, blood transfusion planned possible obs.  Laurena Slimmer, RN 02/23/2015, 7:26 PM

## 2015-02-23 NOTE — Telephone Encounter (Signed)
Ok to give verbal. Thanks.

## 2015-02-23 NOTE — Clinical Social Work Note (Signed)
Clinical Social Work Assessment  Patient Details  Name: [name] MRN: [id number] Date of Birth: 1941/06/11  Date of referral:  02/23/15               Reason for consult:  Facility Placement                Permission sought to share information with:  Other ([place]) Permission granted to share information::  No  Name::        Agency::     Relationship::     Contact Information:     Housing/Transportation Living arrangements for the past 2 months:  Single Family Home Source of Information:  Patient Patient Interpreter Needed:  None Criminal Activity/Legal Involvement Pertinent to Current Situation/Hospitalization:  No - Comment as needed Significant Relationships:  Spouse, Adult Children, Siblings Lives with:  Spouse Do you feel safe going back to the place where you live?  No Need for family participation in patient care:  Yes (Comment)  Care giving concerns: Pt feels as if she cannot manage at home with her current supports and requests NHP.  Pt has been at both [facility] and [facility] in the past, but would like to explore other options, as available.  Social Worker assessment / plan: Pt familiar with NHP process from previous admissions.  CSW will begin placement process and facilitate NH tx as appropriate.  Employment status:  Retired Nurse, adult PT Recommendations:  Not assessed at this time Information / Referral to community resources:  [place]  Patient/Family's Response to care: Pt agreeable to NH bed search in [employer]. And eventual placement. Patient/Family's Understanding of and Emotional Response to Diagnosis, Current Treatment, and Prognosis:  Pt realizes that she cannot be cared for in her current state, in her current home environment, and agrees to placement.  Pt very uncomfortable in bed, anxious, raising voice, asking for bedside commode.  CSW alerted RN.  Emotional  Assessment Appearance:  Appears stated age Attitude/Demeanor/Rapport:  Other (cooperative, anxious) Affect (typically observed):  Anxious, Appropriate Orientation:  Oriented to Self, Oriented to Place, Oriented to  Time, Oriented to Situation Alcohol / Substance use:  Never Used Psych involvement (Current and /or in the community):  No (Comment)  Discharge Needs  Concerns to be addressed:  Discharge Planning Concerns Readmission within the last 30 days:  No Current discharge risk:  None Barriers to Discharge:  No Barriers Identified   Dalma Panchal, Miachel Roux, LCSW 02/23/2015, 9:33 PM

## 2015-02-23 NOTE — Telephone Encounter (Signed)
Called [facility] - straight to voicemail. Left message.  plz check with [name] - how is patient feeling? If worsening dyspnea or chest pain or decreased urinary output or worsening active bleed agree with ER evaluation, otherwise could try to get to short stay today or tomorrow for transfusion

## 2015-02-23 NOTE — Telephone Encounter (Signed)
Deloria Lair, NP with [facility] called to make sure you received a fax with abnormal HGB ( 7.3) on pt. She wanted to make sure it was addressed asap, and wanted to know if you wanted to send her for a transfusion. Please return call asap to advise.

## 2015-02-23 NOTE — Patient Outreach (Unsigned)
[name]) Care Management  [name]  02/23/2015   [name] 1940/10/14 [id number]   Subjective: Pt back at home after SNF stay. She is feeling poorly, she is vague about this. She admits she cannot do what she used to do before going to the hospital and SNF. She says she needs more help to do her usual ADLs. She admits to feeling more depressed. She says her sugars are up in the 200's and 300's since she came home.  Objective: BP 140/80 mmHg  Pulse 60  Resp 16  Ht 1.588 m (5' 2.5")  Wt 209 lb (94.802 kg)  BMI 37.59 kg/m2  SpO2 98%   RRR Lungs are clear, somewhat diminished. Pt wearing her O2 per [place] at 2L. No edema Pt appears depressed, sad expression, slumping posture.   Current Medications:  Current Outpatient Prescriptions  Medication Sig Dispense Refill  . ADVAIR DISKUS 250-50 MCG/DOSE AEPB Inhale 1 puff into the lungs 2 (two) times daily.  4  . albuterol (PROAIR HFA) 108 (90 BASE) MCG/ACT inhaler Inhale 2 puffs into the lungs every 6 (six) hours as needed for wheezing or shortness of breath.     . ALPRAZolam (XANAX) 1 MG tablet TAKE 1 TABLET BY MOUTH EVERY 8 HOURS AS NEEDED FOR ANXIETY 30 tablet 0  . b complex vitamins tablet Take 1 tablet by mouth daily.     [name] buPROPion (WELLBUTRIN XL) 150 MG 24 hr tablet Take 1 tablet (150 mg total) by mouth daily. 90 tablet 3  . darbepoetin (ARANESP) 200 MCG/0.4ML SOLN injection Inject 200 mcg into the skin every 14 (fourteen) days.    . furosemide (LASIX) 40 MG tablet Take 40-80 mg by mouth 2 (two) times daily. 80 mg in the morning and 40 mg in the afternoon    . guaiFENesin (MUCINEX) 600 MG 12 hr tablet Take 600 mg by mouth 2 (two) times daily. 2 tablet by mouth once daily    . Insulin Glargine (LANTUS SOLOSTAR) 100 UNIT/ML Solostar Pen Inject 140 Units into the skin every morning. 20 pen 11  . insulin lispro (HUMALOG) 100 UNIT/ML KiwkPen INJECT 5-30 UNITS INTO THE SKIN AT 4PM PER SLIDING SCALE (5  for every 50 over 150)    . lactulose, encephalopathy, (CHRONULAC) 10 GM/15ML SOLN Take 30 mLs (20 g total) by mouth 2 (two) times daily. constipation 2700 mL 6  . levalbuterol (XOPENEX) 0.63 MG/3ML nebulizer solution Take 0.63 mg by nebulization every 8 (eight) hours as needed for shortness of breath.   5  . loratadine (CLARITIN) 10 MG tablet TAKE 1 TABLET EVERY DAY 30 tablet 6  . Multiple Vitamins-Minerals (CENTRUM SILVER PO) Take 1 tablet by mouth daily.     . nadolol (CORGARD) 20 MG tablet TAKE 1/2 TABLET BY MOUTH DAILY 15 tablet 1  . NON FORMULARY 3 liters oxygen 24/7    . ONE TOUCH LANCETS MISC Use to check sugar 4 times daily and as needed. EY:C14.48 **ONE TOUCH DELICA** 185 each 2  . oxyCODONE (OXY IR/ROXICODONE) 5 MG immediate release tablet Take one tablet by mouth every 4 hours as needed for pain 30 tablet 0  . pantoprazole (PROTONIX) 40 MG tablet Take 1 tablet (40 mg total) by mouth 2 (two) times daily. 60 tablet 6  . PRISTIQ 50 MG 24 hr tablet TAKE 3 TABLETS BY MOUTH EVERY DAY 90 tablet 1  . promethazine (PHENERGAN) 25 MG tablet Take 1 tablet (25 mg total) by mouth 2 (two)  times daily as needed for nausea. 30 tablet 3  . QUEtiapine (SEROQUEL) 200 MG tablet Take 1 tablet (200 mg total) by mouth at bedtime. 30 tablet 6  . rOPINIRole (REQUIP) 4 MG tablet Take 1 tablet (4 mg total) by mouth at bedtime. 30 tablet 11  . Saline (OCEAN NASAL SPRAY NA) Place 1 spray into the nose 3 (three) times daily as needed (congestion).     . simvastatin (ZOCOR) 40 MG tablet TAKE 1 TABLET BY MOUTH EVERY EVENING 30 tablet 3  . spironolactone (ALDACTONE) 25 MG tablet TAKE 1/2 TABLET BY MOUTH DAILY 15 tablet 1  . Vitamin D, Ergocalciferol, (DRISDOL) 50000 UNITS CAPS capsule TAKE 1 CAPSULE (50,000 UNITS TOTAL) BY MOUTH EVERY 7 (SEVEN) DAYS. TAKE ON FRIDAY 5 capsule 3  . XIFAXAN 550 MG TABS tablet TAKE 1 TABLET (550 MG TOTAL) BY MOUTH DAILY. 30 tablet 1   No current facility-administered medications for this  visit.    Functional Status:  In your present state of health, do you have any difficulty performing the following activities: 02/23/2015 02/03/2015  Hearing? - N  Vision? - N  Difficulty concentrating or making decisions? - N  Walking or climbing stairs? - Y  Dressing or bathing? Y N  Doing errands, shopping? - Facilities manager and eating ? Y -  Using the Toilet? - -  In the past six months, have you accidently leaked urine? - -  Do you have problems with loss of bowel control? - -  Managing your Medications? - -  Managing your Finances? - -  Housekeeping or managing your Housekeeping? - -    Fall/Depression Screening: PHQ 2/9 Scores 01/05/2015 12/19/2014 12/19/2014  PHQ - 2 Score 2 2 2   PHQ- 9 Score - - 10    Assessment: General decline in her health status - may not be able to stay home                          independently.  Plan: Will advise Dr. Danise [name] of pt disposition.           [place] Labs requested: CBC and BMET.            I will call pt next week to follow up and see her in 2 weeks.  [facility] CM Care Plan Problem One        Patient Outreach from 02/21/2015 in [place] for Problem One  Active    [name] CM Care Plan Problem Two        Patient Outreach from 01/05/2015 in [place] CM Short Term Goal #1 (0-30 days)  Pt will be able to identify carbs vs proteins. in 30 days.    [place] Problem Three        Patient Outreach from 02/21/2015 in [place] Problem Three  Pt declining independence and may not be able to stay at home.   Care Plan for Problem Three  Active   [facility] CM Short Term Goal #1 (0-30 days)  Pt to advise NP or MD if she feels she can no longer be safe at home.   [facility] CM Short Term Goal #1 Start Date  02/21/15   Interventions for Short Term Goal #1  Advised pt that it would be possible to place her in an ALF within the next 30 days if she feels  she cannot be safe at home.      [name] [facility] [place] [phone]

## 2015-02-23 NOTE — Consult Note (Signed)
   [facility] CM Inpatient Consult   02/23/2015  [name] [birth year] [id number]   Received call from [facility] NP to make writer aware of patient being in the ED. Please refer to [place] patient outreach notes under chart review tab in EPIC to see documentation from [facility] NP. [facility] NP reports that patient really needs SNF placement at discharge. Spoke with ED Licensed CSW to make aware of [facility] NP concerns. Will continue to follow.  Marthenia Rolling, MSN-Ed, RN,BSN [facility] Liaison [phone]

## 2015-02-23 NOTE — H&P (Signed)
[facility] History and Physical  SHEVELLE SMITHER XTG:[id number] DOB: Feb 20, 1941 DOA: 02/23/2015  Referring physician: ED physician PCP: Ria Bush, MD  Specialists:   Chief Complaint: generalized weakness, hypotension.  HPI: [name] is a 74 y.o. female with PMH of GAVE (gastric antral vascular actasia), [place], liver cirrhosis, diastolic heart failure, portal hypertension, COPD on 3 L of oxygen chronically at home, aortic stenosis, CCKD-III, who presents with generalized weakness.  Patient was recently hospitalized from 6/160/4 due to COPD exacerbation. She was sent to rehab center, and just discharged home from rehabilitation center on Saturday. She state that she start feeling weak at the same day when she went home from rehab center.Today, home health nurse came out and drew blood. Her blood count was found to be low. Her blood pressure is also low. She has hx of GIB. She states that she did not pay attention about whether she has bloody stool, but reports no abdominal pain, nausea, vomiting or diarreha. Reports decreased appetite for several weeks. She feels that she needs to go back into rehabilitation. She states that she has had frequent blood transfusions in the past which were related to "erosions" in her stomach and intestine. Currently patient does not have chest pain. She has mild cough and SOB due to COPD which are at her baseline. She reports that she has intermittent burning on urination, but currently no dysuria, burning on urination or increased urinary frequency. No unilateral weakness.  In ED, patient was found to have hemoglobin dropped from 8.6 on 01/28/15 to 7.0, pancytopenia, FOBT negative, bradycardia, soft blood pressure, AoCKD-III, negative troponin, BNP 226. Urinalysis showed small amount of leukocyte, but urine collection was dirty catch with many squamous cells. Patient is admitted to inpatient for further evaluation and treatment. GI was consulted by  ED.  Where does patient live?   At home    Can patient participate in ADLs?  Little   Review of Systems:   General: no fevers, chills, no changes in body weight, has poor appetite, has fatigue HEENT: no blurry vision, hearing changes or sore throat Pulm: has dyspnea, coughing, no wheezing CV: no chest pain, palpitations Abd: no nausea, vomiting, abdominal pain, diarrhea, constipation GU: no dysuria, some times has burning on urination, no increased urinary frequency, hematuria  Ext: no leg edema. Has tenderness over right lower leg including calf area. Neuro: no unilateral weakness, numbness, or tingling, no vision change or hearing loss Skin: no rash MSK: No muscle spasm, no deformity, no limitation of range of movement in spin Heme: No easy bruising.  Travel history: No recent long distant travel.  Allergy:  Allergies  Allergen Reactions  . Acetaminophen Other (See Comments)    Liver dysfunction  . Aspirin Other (See Comments)    Causes nosebleeds  . Theophylline Nausea And Vomiting  . Penicillins Itching    Past Medical History  Diagnosis Date  . Chronic airway obstruction, not elsewhere classified   . Unspecified chronic bronchitis   . Unspecified essential hypertension   . Non-obstructive CAD   . Palpitations   . Mild aortic stenosis     a. 03/2014 Valve area (VTI): 2.89 cm^2, Valve area (Vmax): 2.7 cm^2.  . Pure hypercholesterolemia   . Morbid obesity   . Esophageal reflux   . GAVE (gastric antral vascular ectasia)     angiodysplasia  . Diverticulosis of colon (without mention of hemorrhage)   . Benign neoplasm of colon   . Autoimmune hepatitis   . Osteoarthrosis,  unspecified whether generalized or localized, unspecified site   . Osteoporosis, unspecified   . Restless legs syndrome (RLS)   . Major depressive disorder, recurrent episode, severe, specified as with psychotic behavior   . Iron deficiency anemia secondary to blood loss (chronic)     a. frequent  PRBC transfusions.  . Coarse tremors     a. arms.  . Carpal tunnel syndrome on both sides   . Palpitations   . Chronic diastolic CHF (congestive heart failure)     a. 03/26/2014 Echo: EF 55-60%, no rwma, mild AS/AI, mod-sev Ca2+ MV annulus, mildly to mod dil LA.  [name] Asthma   . Type II diabetes mellitus   . H/O hiatal hernia   . 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 dx'd 1990's  . Adenomatous colon polyp   . Midsternal chest pain     a. conservatively managed ->poor candidate for cath/anticoagulation.  [name] Anxiety   . Portal hypertensive gastropathy   . Falls frequently     completed HHPT/OT 06/2014, PT unmet goals  . Recurrent UTI (urinary tract infection)     h/o hospitalization with urosepsis 2015, but thought large component colonization/bacteriuria, only treat if symptomatic ([place])  . Hiatal hernia   . Nonalcoholic steatohepatitis (NASH)   . Thrombocytopenia   . Protein calorie malnutrition   . Family history of adverse reaction to anesthesia     Mother and sister were hard to wake up  . History of pneumonia     CAP 2015  . On home oxygen therapy     "3L; 24/7" (01/25/2015)  . OSA (obstructive sleep apnea)     a. does not use CPAP. (01/25/2015)  . GAVE (gastric antral vascular ectasia)     Past Surgical History  Procedure Laterality Date  . Hemorroidectomy    . Appendectomy    . Vaginal hysterectomy    . Breast biopsy      bilateral  . Cesarean section      x 3  . Appendectomy    . Esophagogastroduodenoscopy  06/12/2012    Procedure: ESOPHAGOGASTRODUODENOSCOPY (EGD);  Surgeon: Lafayette Dragon, MD;  Location: [facility];  Service: Endoscopy;  Laterality: N/A;  . Balloon dilation  06/12/2012    Procedure: BALLOON DILATION;  Surgeon: Lafayette Dragon, MD;  Location: [facility];  Service: Endoscopy;  Laterality: N/A;  ?balloon  . Esophagogastroduodenoscopy  09/10/2012    Procedure: ESOPHAGOGASTRODUODENOSCOPY (EGD);  Surgeon: Lafayette Dragon,  MD;  Location: [facility];  Service: Endoscopy;  Laterality: N/A;  . Hot hemostasis  09/10/2012    Procedure: HOT HEMOSTASIS (ARGON PLASMA COAGULATION/BICAP);  Surgeon: Lafayette Dragon, MD;  Location: [facility];  Service: Endoscopy;  Laterality: N/A;  . Esophagogastroduodenoscopy N/A 01/18/2013    Procedure: ESOPHAGOGASTRODUODENOSCOPY (EGD);  Surgeon: Lafayette Dragon, MD;  Location: [facility];  Service: Endoscopy;  Laterality: N/A;  . Esophagogastroduodenoscopy N/A 05/24/2013    Procedure: ESOPHAGOGASTRODUODENOSCOPY (EGD);  Surgeon: Jerene Bears, MD;  Location: [place];  Service: Gastroenterology;  Laterality: N/A;  . Hot hemostasis N/A 05/24/2013    Procedure: HOT HEMOSTASIS (ARGON PLASMA COAGULATION/BICAP);  Surgeon: Jerene Bears, MD;  Location: [place];  Service: Gastroenterology;  Laterality: N/A;  . US echocardiography  07/2014    mild LVH, EF 60-65%, normal wall motion, mild AR, mod dilated LA, mildly dilated RA, peak PA pressure 21mmHg  . Esophagogastroduodenoscopy N/A 10/20/2014    Procedure: ESOPHAGOGASTRODUODENOSCOPY (EGD);  Surgeon: Lafayette Dragon, MD;  Location: [facility];  Service: Endoscopy;  Laterality: N/A;  . Hot hemostasis N/A 10/20/2014    Procedure: HOT HEMOSTASIS (ARGON PLASMA COAGULATION/BICAP);  Surgeon: Lafayette Dragon, MD;  Location: [facility];  Service: Endoscopy;  Laterality: N/A;    Social History:  reports that she quit smoking about 22 years ago. Her smoking use included Cigarettes. She has a 37.5 pack-year smoking history. She has never used smokeless tobacco. She reports that she does not drink alcohol or use illicit drugs.  Family History:  Family History  Problem Relation Age of Onset  . Heart disease Mother   . Cervical cancer Mother   . Kidney disease Mother   . Diabetes Mother   . Breast cancer Sister   . Multiple sclerosis Sister   . Colon cancer Neg Hx   . Breast cancer Maternal Aunt     x 2  . Diabetes Father   . Diabetes Sister   .  Multiple sclerosis Other   . Heart attack Mother   . Heart attack Father   . Stroke Daughter   . Stroke Daughter      Prior to Admission medications   Medication Sig Start Date End Date Taking? Authorizing Provider  ADVAIR DISKUS 250-50 MCG/DOSE AEPB Inhale 1 puff into the lungs 2 (two) times daily. 01/15/15  Yes Historical Provider, MD  albuterol (PROAIR HFA) 108 (90 BASE) MCG/ACT inhaler Inhale 2 puffs into the lungs every 6 (six) hours as needed for wheezing or shortness of breath.    Yes Historical Provider, MD  ALPRAZolam Duanne Moron) 1 MG tablet TAKE 1 TABLET BY MOUTH EVERY 8 HOURS AS NEEDED FOR ANXIETY 02/08/15  Yes Ria Bush, MD  b complex vitamins tablet Take 1 tablet by mouth daily.    Yes Historical Provider, MD  buPROPion (WELLBUTRIN XL) 150 MG 24 hr tablet Take 1 tablet (150 mg total) by mouth daily. 12/19/14  Yes Ria Bush, MD  darbepoetin (ARANESP) 200 MCG/0.4ML SOLN injection Inject 200 mcg into the skin every 14 (fourteen) days.   Yes Historical Provider, MD  furosemide (LASIX) 40 MG tablet Take 40 mg by mouth at bedtime. 80 mg in the morning and 40 mg in the afternoon 03/31/14  Yes Belkys A Regalado, MD  furosemide (LASIX) 80 MG tablet Take 80 mg by mouth daily.   Yes Historical Provider, MD  guaiFENesin (MUCINEX) 600 MG 12 hr tablet Take 600 mg by mouth 2 (two) times daily. 2 tablet by mouth once daily   Yes Historical Provider, MD  Insulin Glargine (LANTUS SOLOSTAR) 100 UNIT/ML Solostar Pen Inject 140 Units into the skin every morning. 01/03/15  Yes Renato Shin, MD  insulin lispro (HUMALOG) 100 UNIT/ML KiwkPen INJECT 5-30 UNITS INTO THE SKIN AT 4PM PER SLIDING SCALE (5 for every 50 over 150) 12/19/14  Yes Ria Bush, MD  lactulose, encephalopathy, (CHRONULAC) 10 GM/15ML SOLN Take 30 mLs (20 g total) by mouth 2 (two) times daily. constipation Patient taking differently: Take 20 g by mouth daily as needed (constipation). constipation 07/14/14  Yes Lori P Hvozdovic,  PA-C  levalbuterol (XOPENEX) 0.63 MG/3ML nebulizer solution Take 0.63 mg by nebulization 3 (three) times daily.  06/05/14  Yes Historical Provider, MD  loratadine (CLARITIN) 10 MG tablet TAKE 1 TABLET EVERY DAY 02/08/15  Yes Ria Bush, MD  Multiple Vitamins-Minerals (CENTRUM SILVER PO) Take 1 tablet by mouth daily.    Yes Historical Provider, MD  nadolol (CORGARD) 20 MG tablet TAKE 1/2 TABLET BY MOUTH DAILY 11/25/14  Yes  Sherren Mocha, MD  oxyCODONE (OXY IR/ROXICODONE) 5 MG immediate release tablet Take one tablet by mouth every 4 hours as needed for pain 01/28/15  Yes Costin Karlyne Greenspan, MD  pantoprazole (PROTONIX) 40 MG tablet Take 1 tablet (40 mg total) by mouth 2 (two) times daily. 12/05/14  Yes Ria Bush, MD  PRISTIQ 50 MG 24 hr tablet TAKE 3 TABLETS BY MOUTH EVERY DAY 11/17/14  Yes Ria Bush, MD  promethazine (PHENERGAN) 25 MG tablet Take 1 tablet (25 mg total) by mouth 2 (two) times daily as needed for nausea. 06/09/14  Yes Ria Bush, MD  QUEtiapine (SEROQUEL) 200 MG tablet Take 1 tablet (200 mg total) by mouth at bedtime. 12/05/14  Yes Ria Bush, MD  rOPINIRole (REQUIP) 4 MG tablet Take 1 tablet (4 mg total) by mouth at bedtime. 01/16/15  Yes Ria Bush, MD  [facility] NASAL SPRAY NA) Place 1 spray into the nose 3 (three) times daily as needed (congestion).    Yes Historical Provider, MD  simvastatin (ZOCOR) 40 MG tablet TAKE 1 TABLET BY MOUTH EVERY EVENING 11/23/14  Yes Ria Bush, MD  spironolactone (ALDACTONE) 25 MG tablet TAKE 1/2 TABLET BY MOUTH DAILY 12/19/14  Yes Sherren Mocha, MD  Vitamin D, Ergocalciferol, (DRISDOL) 50000 UNITS CAPS capsule TAKE 1 CAPSULE (50,000 UNITS TOTAL) BY MOUTH EVERY 7 (SEVEN) DAYS. TAKE ON FRIDAY 05/16/14  Yes Ria Bush, MD  XIFAXAN 550 MG TABS tablet TAKE 1 TABLET (550 MG TOTAL) BY MOUTH DAILY. 01/12/15  Yes Lafayette Dragon, MD    Physical Exam: Filed Vitals:   02/23/15 1900 02/23/15 1930 02/23/15 2000 02/23/15  2030  BP: 95/34 94/33 91/31  84/63  Pulse: 45 47 43 49  Temp:      TempSrc:      Resp: 16 17 18 15   Height:      Weight:      SpO2: 100% 100% 100% 98%   General: Not in acute distress. Pale looking, dry mucus and membrane. HEENT:       Eyes: PERRL, EOMI, no scleral icterus.       ENT: No discharge from the ears and nose, no pharynx injection, no tonsillar enlargement.        Neck: No JVD, no bruit, no mass felt. Heme: No neck lymph node enlargement. Cardiac: S1/S2, RRR, No murmurs, No gallops or rubs. Pulm: No rales, wheezing, rhonchi or rubs. Abd: Soft, nondistended, nontender, no rebound pain, no organomegaly, BS present. Ext: No pitting leg edema bilaterally. 2+DP/PT pulse bilaterally. Has tenderness over right lower leg including calf area. Musculoskeletal: No joint deformities, No joint redness or warmth, no limitation of ROM in spin. Skin: No rashes.  Neuro: Alert, oriented X3, cranial nerves II-XII grossly intact, muscle strength 5/5 in all extremities, sensation to light touch intact.  Psych: Patient is not psychotic, no suicidal or hemocidal ideation.  Labs on Admission:  Basic Metabolic Panel:  Recent Labs Lab 02/23/15 1556  NA 137  K 4.1  CL 105  CO2 24  GLUCOSE 131*  BUN 103*  CREATININE 1.89*  CALCIUM 8.9   Liver Function Tests: No results for input(s): AST, ALT, ALKPHOS, BILITOT, PROT, ALBUMIN in the last 168 hours. No results for input(s): LIPASE, AMYLASE in the last 168 hours. No results for input(s): AMMONIA in the last 168 hours. CBC:  Recent Labs Lab 02/23/15 1556  WBC 2.3*  NEUTROABS 1.6*  HGB 7.0*  HCT 21.2*  MCV 99.5  PLT 59*   Cardiac Enzymes: No results  for input(s): CKTOTAL, CKMB, CKMBINDEX, TROPONINI in the last 168 hours.  BNP (last 3 results)  Recent Labs  08/29/14 1820 01/25/15 0817 02/23/15 1556  BNP 233.0* 95.5 226.5*    ProBNP (last 3 results)  Recent Labs  03/26/14 0259 08/11/14 1755  PROBNP 3352.0* 226.4*     CBG: No results for input(s): GLUCAP in the last 168 hours.  Radiological Exams on Admission: Dg Chest 2 View  02/23/2015   CLINICAL DATA:  Weakness, bradycardia.  EXAM: CHEST  2 VIEW  COMPARISON:  January 25, 2015.  FINDINGS: Stable cardiomediastinal silhouette. No pneumothorax or pleural effusion is noted. Stable scarring is noted in both lung bases. No acute pulmonary disease is noted. The visualized skeletal structures are unremarkable.  IMPRESSION: No active cardiopulmonary disease.   Electronically Signed   By: Marijo Conception, M.D.   On: 02/23/2015 16:32    EKG: Independently reviewed.  Abnormal findings:  Bradycardia   Assessment/Plan Principal Problem:   Symptomatic anemia Active Problems:   HYPERCHOLESTEROLEMIA   Iron deficiency anemia due to chronic blood loss   Severe recurrent major depressive disorder with psychotic features   RESTLESS LEGS SYNDROME   Essential hypertension   Coronary atherosclerosis   COPD (chronic obstructive pulmonary disease) with chronic bronchitis   GERD   Autoimmune hepatitis   Osteoporosis   Obesity, Class II, BMI 35-39.9, with comorbidity   GAVE (gastric antral vascular ectasia)   Other pancytopenia   Chronic diastolic CHF (congestive heart failure)   Acute renal failure superimposed on stage 3 chronic kidney disease   Hepatic cirrhosis   Chronic respiratory failure   Bradycardia   Diabetes mellitus type 2 with complications, uncontrolled   Anemia  Symptomatic anemia: Hemoglobin dropped from 8.6-7.0 with low blood pressure. Patient mental status is okay. Etiololgy is not clear, but likely due to uuper GBI given hix of GAVE and liver cirrhosis. GI consulted by Ed  - will admit to SDU - NPO for possible EGD - IVF: 2L NS and then 125 mL/hr - Start IV pantoprazole 40 mg bib - Zofran IV for nausea - Avoid NSAIDs and SQ heparin - Maintain IV access (2 large bore IVs if possible). - Monitor closely and follow q6h cbc, transfuse as  necessary. - LaB: INR, PTT and type screen - transfuse 2U of blood - will follow up GI recommendations  COPD: stable. -Xopenex nebulizer, Dulera, albuterol inhaler prn - mucinex - nasal canula oxygen  Pancytopenia: Likely secondary to NASH and CKD. now presents with symptomatic anemia with hemoglobin dropped by 1.6 g. -transfuse 2 U of blood  AoCKD-III: Baseline Cre is 1.4-1.7, her Cre is 1.89 on admission. Likely due to prerenal secondary to severe anemia and dehydration, and continuation of diruetics - IVF as above and transfuse as above - Follow up renal function by BMP - Avoid ACEI and NSAIDs - Hold Diuretics, lasix  NASH and cirrhosis -Will continue Lactulose and rifaximin -continue Nadolol (Variceal bleed prophylaxis). Due to hypotension, will postpone to start nadolol in AM  Diastolic CHF: Echo:  LVEF 08/14/14 60 - 65%. Mild LVH. Appears euvolemic. -hold diuretics due to hypotension -check BNP  DM-II: Last A1c 7.1, fairly controled. Patient is taking lautus at home -will decrease Lantus dose from 140 to 100 units daily   -SSI  Depression and anxiety: no suicidal or homicidal ideations. -Continue home medications  Right leg pain including tenderness over calf area: -LE doppler to r/o DVT   DVT ppx: SCD only to the left  leg  Code Status: Partial code (OK with CPR, but not intubation) Family Communication:  Yes, patient's  husband  at bed side Disposition Plan: Admit to inpatient   Date of Service 02/23/2015    Ivor Costa [facility] Pager [phone]  If 7PM-7AM, please contact night-coverage [email or web address] Password [email or web address] 02/23/2015, 9:03 PM

## 2015-02-23 NOTE — ED Provider Notes (Signed)
CSN: [id number]     Arrival date & time 02/23/15  1518 History   First MD Initiated Contact with Patient 02/23/15 1519     Chief Complaint  Patient presents with  . Weakness     (Consider location/radiation/quality/duration/timing/severity/associated sxs/prior Treatment) HPI  [name] is a 74 y.o. female who is here for evaluation of low hemoglobin, and hypotension. She was discharged from a rehabilitation facility 4 days ago, to her home. She had been admitted there, for COPD exacerbation. Today, home health came out and drew blood and her blood count was found to be low. She has had transfusions in the past. Has been no known blood per rectum, nausea, vomiting, fever or chills. Reports decreased appetite for several weeks. She feels that she needs to go back into rehabilitation. She states that she has had frequent blood transfusions in the past which were related to "erosions" in her stomach and intestine. There are no other known modifying factors.     Past Medical History  Diagnosis Date  . Chronic airway obstruction, not elsewhere classified   . Unspecified chronic bronchitis   . Unspecified essential hypertension   . Non-obstructive CAD   . Palpitations   . Mild aortic stenosis     a. 03/2014 Valve area (VTI): 2.89 cm^2, Valve area (Vmax): 2.7 cm^2.  . Pure hypercholesterolemia   . Morbid obesity   . Esophageal reflux   . GAVE (gastric antral vascular ectasia)     angiodysplasia  . Diverticulosis of colon (without mention of hemorrhage)   . Benign neoplasm of colon   . Autoimmune hepatitis   . Osteoarthrosis, unspecified whether generalized or localized, unspecified site   . Osteoporosis, unspecified   . Restless legs syndrome (RLS)   . Major depressive disorder, recurrent episode, severe, specified as with psychotic behavior   . Iron deficiency anemia secondary to blood loss (chronic)     a. frequent PRBC transfusions.  . Coarse tremors     a. arms.  . Carpal  tunnel syndrome on both sides   . Palpitations   . Chronic diastolic CHF (congestive heart failure)     a. 03/26/2014 Echo: EF 55-60%, no rwma, mild AS/AI, mod-sev Ca2+ MV annulus, mildly to mod dil LA.  [name] Asthma   . Type II diabetes mellitus   . H/O hiatal hernia   . 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 dx'd 1990's  . Adenomatous colon polyp   . Midsternal chest pain     a. conservatively managed ->poor candidate for cath/anticoagulation.  [name] Anxiety   . Portal hypertensive gastropathy   . Falls frequently     completed HHPT/OT 06/2014, PT unmet goals  . Recurrent UTI (urinary tract infection)     h/o hospitalization with urosepsis 2015, but thought large component colonization/bacteriuria, only treat if symptomatic ([place])  . Hiatal hernia   . Nonalcoholic steatohepatitis (NASH)   . Thrombocytopenia   . Protein calorie malnutrition   . Family history of adverse reaction to anesthesia     Mother and sister were hard to wake up  . History of pneumonia     CAP 2015  . On home oxygen therapy     "3L; 24/7" (01/25/2015)  . OSA (obstructive sleep apnea)     a. does not use CPAP. (01/25/2015)   Past Surgical History  Procedure Laterality Date  . Hemorroidectomy    . Appendectomy    . Vaginal hysterectomy    .  Breast biopsy      bilateral  . Cesarean section      x 3  . Appendectomy    . Esophagogastroduodenoscopy  06/12/2012    Procedure: ESOPHAGOGASTRODUODENOSCOPY (EGD);  Surgeon: Lafayette Dragon, MD;  Location: [facility];  Service: Endoscopy;  Laterality: N/A;  . Balloon dilation  06/12/2012    Procedure: BALLOON DILATION;  Surgeon: Lafayette Dragon, MD;  Location: [facility];  Service: Endoscopy;  Laterality: N/A;  ?balloon  . Esophagogastroduodenoscopy  09/10/2012    Procedure: ESOPHAGOGASTRODUODENOSCOPY (EGD);  Surgeon: Lafayette Dragon, MD;  Location: [facility];  Service: Endoscopy;  Laterality: N/A;  . Hot hemostasis  09/10/2012     Procedure: HOT HEMOSTASIS (ARGON PLASMA COAGULATION/BICAP);  Surgeon: Lafayette Dragon, MD;  Location: [facility];  Service: Endoscopy;  Laterality: N/A;  . Esophagogastroduodenoscopy N/A 01/18/2013    Procedure: ESOPHAGOGASTRODUODENOSCOPY (EGD);  Surgeon: Lafayette Dragon, MD;  [facility] END[facility]COPY;  Service: Endoscopy;  Laterality: N/A;  . Esophagogastroduodenoscopy N/A 05/24/2013    Procedure: ESOPHAGOGASTRODUODENOSCOPY (EGD);  Surgeon: Jerene Bears, MD;  Location: [facility];  Service: Gastroenterology;  Laterality: N/A;  . Hot hemostasis N/A 05/24/2013    Procedure: HOT HEMOSTASIS (ARGON PLASMA COAGULATION/BICAP);  Surgeon: Jerene Bears, MD;  Location: [facility];  Service: Gastroenterology;  Laterality: N/A;  . US echocardiography  07/2014    mild LVH, EF 60-65%, normal wall motion, mild AR, mod dilated LA, mildly dilated RA, peak PA pressure 55mmHg  . Esophagogastroduodenoscopy N/A 10/20/2014    Procedure: ESOPHAGOGASTRODUODENOSCOPY (EGD);  Surgeon: Lafayette Dragon, MD;  Location: [facility];  Service: Endoscopy;  Laterality: N/A;  . Hot hemostasis N/A 10/20/2014    Procedure: HOT HEMOSTASIS (ARGON PLASMA COAGULATION/BICAP);  Surgeon: Lafayette Dragon, MD;  Location: [facility];  Service: Endoscopy;  Laterality: N/A;   Family History  Problem Relation Age of Onset  . Heart disease Mother   . Cervical cancer Mother   . Kidney disease Mother   . Diabetes Mother   . Breast cancer Sister   . Multiple sclerosis Sister   . Colon cancer Neg Hx   . Breast cancer Maternal Aunt     x 2  . Diabetes Father   . Diabetes Sister   . Multiple sclerosis Other   . Heart attack Mother   . Heart attack Father   . Stroke Daughter   . Stroke Daughter    History  Substance Use Topics  . Smoking status: Former Smoker -- 1.50 packs/day for 25 years    Types: Cigarettes    Quit date: 08/26/1992  . Smokeless tobacco: Never Used  . Alcohol Use: No     Comment: 1/61/09 "last alcoholic drink was years  ago"   OB History    No data available     Review of Systems  All other systems reviewed and are negative.     Allergies  Acetaminophen; Aspirin; Theophylline; and Penicillins  Home Medications   Prior to Admission medications   Medication Sig Start Date End Date Taking? Authorizing Provider  albuterol (PROAIR HFA) 108 (90 BASE) MCG/ACT inhaler Inhale 2 puffs into the lungs every 6 (six) hours as needed for wheezing or shortness of breath.    Yes Historical Provider, MD  ALPRAZolam Duanne Moron) 1 MG tablet TAKE 1 TABLET BY MOUTH EVERY 8 HOURS AS NEEDED FOR ANXIETY 02/08/15  Yes Ria Bush, MD  b complex vitamins tablet Take 1 tablet by mouth daily.    Yes Historical Provider, MD  buPROPion (WELLBUTRIN XL) 150 MG 24 hr tablet Take 1 tablet (150 mg total) by mouth daily. 12/19/14  Yes Ria Bush, MD  darbepoetin (ARANESP) 200 MCG/0.4ML SOLN injection Inject 200 mcg into the skin every 14 (fourteen) days.   Yes Historical Provider, MD  furosemide (LASIX) 80 MG tablet Take 80 mg by mouth daily.   Yes Historical Provider, MD  Insulin Glargine (LANTUS SOLOSTAR) 100 UNIT/ML Solostar Pen Inject 140 Units into the skin every morning. 01/03/15  Yes Renato Shin, MD  insulin lispro (HUMALOG) 100 UNIT/ML KiwkPen INJECT 5-30 UNITS INTO THE SKIN AT 4PM PER SLIDING SCALE (5 for every 50 over 150) 12/19/14  Yes Ria Bush, MD  levalbuterol Penne Lash) 0.63 MG/3ML nebulizer solution Take 0.63 mg by nebulization 3 (three) times daily.  06/05/14  Yes Historical Provider, MD  loratadine (CLARITIN) 10 MG tablet TAKE 1 TABLET EVERY DAY 02/08/15  Yes Ria Bush, MD  Multiple Vitamins-Minerals (CENTRUM SILVER PO) Take 1 tablet by mouth daily.    Yes Historical Provider, MD  nadolol (CORGARD) 20 MG tablet TAKE 1/2 TABLET BY MOUTH DAILY 11/25/14  Yes Sherren Mocha, MD  oxyCODONE (OXY IR/ROXICODONE) 5 MG immediate release tablet Take one tablet by mouth every 4 hours as needed for pain 01/28/15  Yes  Costin Karlyne Greenspan, MD  pantoprazole (PROTONIX) 40 MG tablet Take 1 tablet (40 mg total) by mouth 2 (two) times daily. 12/05/14  Yes Ria Bush, MD  PRISTIQ 50 MG 24 hr tablet TAKE 3 TABLETS BY MOUTH EVERY DAY 11/17/14  Yes Ria Bush, MD  promethazine (PHENERGAN) 25 MG tablet Take 1 tablet (25 mg total) by mouth 2 (two) times daily as needed for nausea. 06/09/14  Yes Ria Bush, MD  QUEtiapine (SEROQUEL) 200 MG tablet Take 1 tablet (200 mg total) by mouth at bedtime. 12/05/14  Yes Ria Bush, MD  rOPINIRole (REQUIP) 4 MG tablet Take 1 tablet (4 mg total) by mouth at bedtime. 01/16/15  Yes Ria Bush, MD  [facility] NASAL SPRAY NA) Place 1 spray into the nose 3 (three) times daily as needed (congestion).    Yes Historical Provider, MD  simvastatin (ZOCOR) 40 MG tablet TAKE 1 TABLET BY MOUTH EVERY EVENING 11/23/14  Yes Ria Bush, MD  spironolactone (ALDACTONE) 25 MG tablet TAKE 1/2 TABLET BY MOUTH DAILY 12/19/14  Yes Sherren Mocha, MD  Vitamin D, Ergocalciferol, (DRISDOL) 50000 UNITS CAPS capsule TAKE 1 CAPSULE (50,000 UNITS TOTAL) BY MOUTH EVERY 7 (SEVEN) DAYS. TAKE ON FRIDAY 05/16/14  Yes Ria Bush, MD  XIFAXAN 550 MG TABS tablet TAKE 1 TABLET (550 MG TOTAL) BY MOUTH DAILY. 01/12/15  Yes Lafayette Dragon, MD  ADVAIR DISKUS 250-50 MCG/DOSE AEPB Inhale 1 puff into the lungs 2 (two) times daily. 01/15/15   Historical Provider, MD  furosemide (LASIX) 40 MG tablet Take 40-80 mg by mouth 2 (two) times daily. 80 mg in the morning and 40 mg in the afternoon 03/31/14   Belkys A Regalado, MD  guaiFENesin (MUCINEX) 600 MG 12 hr tablet Take 600 mg by mouth 2 (two) times daily. 2 tablet by mouth once daily    Historical Provider, MD  lactulose, encephalopathy, (CHRONULAC) 10 GM/15ML SOLN Take 30 mLs (20 g total) by mouth 2 (two) times daily. constipation 07/14/14   Lori P Hvozdovic, PA-C   BP 87/47 mmHg  Pulse 49  Temp(Src) 97.7 F (36.5 C) (Oral)  Resp 21  Ht 5\' 2"  (1.575 m)   Wt 209 lb (94.802 kg)  BMI 38.22 kg/m2  SpO2 98% Physical Exam  Constitutional: She is oriented to person, place, and time. She appears well-developed.  Elderly, obese.  HENT:  Head: Normocephalic and atraumatic.  Right Ear: External ear normal.  Left Ear: External ear normal.  The oral mucous membranes are dry. No oral lesions.  Eyes: Conjunctivae and EOM are normal. Pupils are equal, round, and reactive to light.  Neck: Normal range of motion and phonation normal. Neck supple.  Cardiovascular: Normal rate, regular rhythm and normal heart sounds.   Pulmonary/Chest: Effort normal and breath sounds normal. She exhibits no bony tenderness.  Abdominal: Soft. There is no tenderness.  Genitourinary:  Brown stool in rectal vault. No impaction.  Musculoskeletal: Normal range of motion. She exhibits edema (mild). She exhibits no tenderness.  Neurological: She is alert and oriented to person, place, and time. No cranial nerve deficit or sensory deficit. She exhibits normal muscle tone. Coordination normal.  Skin: Skin is warm, dry and intact.  Psychiatric: She has a normal mood and affect. Her behavior is normal. Judgment and thought content normal.  Nursing note and vitals reviewed.   ED Course  Procedures (including critical care time)  Medications  0.9 %  sodium chloride infusion ( Intravenous New Bag/Given 02/23/15 1640)  sodium chloride 0.9 % bolus 1,000 mL (0 mLs Intravenous Stopped 02/23/15 1639)    Patient Vitals for the past 24 hrs:  BP Temp Temp src Pulse Resp SpO2 Height Weight  02/23/15 1823 (!) 87/47 mmHg - - (!) 49 21 98 % - -  02/23/15 1730 (!) 90/41 mmHg - - (!) 50 17 100 % - -  02/23/15 1722 (!) 103/44 mmHg - - (!) 52 14 100 % - -  02/23/15 1600 102/73 mmHg - - (!) 44 18 100 % - -  02/23/15 1534 (!) 88/44 mmHg 97.7 F (36.5 C) Oral - 16 100 % 5\' 2"  (1.575 m) 209 lb (94.802 kg)    6:46 PM Reevaluation with update and discussion. After initial assessment and  treatment, an updated evaluation reveals no change in clinical status. Rectal examination done at this time shows brown stool, without masses or evidence for bleeding site. Findings discussed with patient and family member, all questions were answered. Lory Galan L    6:49 PM-Consult complete with Dr. Blaine Hamper. Patient case explained and discussed. He agrees to admit patient for further evaluation and treatment. Call ended at 19:57  19:58- paged GI, at request of hospitalist. Dr. Carlean Purl will arrange for consultation in AM  20:20-blood transfusion ordered for persistent hypotension.  CRITICAL CARE Performed by: Richarda Blade Total critical care time: 40 min Critical care time was exclusive of separately billable procedures and treating other patients. Critical care was necessary to treat or prevent imminent or life-threatening deterioration. Critical care was time spent personally by me on the following activities: development of treatment plan with patient and/or surrogate as well as nursing, discussions with consultants, evaluation of patient's response to treatment, examination of patient, obtaining history from patient or surrogate, ordering and performing treatments and interventions, ordering and review of laboratory studies, ordering and review of radiographic studies, pulse oximetry and re-evaluation of patient's condition.    Labs Review Labs Reviewed  BASIC METABOLIC PANEL - Abnormal; Notable for the following:    Glucose, Bld 131 (*)    BUN 103 (*)    Creatinine, Ser 1.89 (*)    GFR calc non Af Amer 26 (*)    GFR calc Af Amer 30 (*)    All other  components within normal limits  CBC WITH DIFFERENTIAL/PLATELET - Abnormal; Notable for the following:    WBC 2.3 (*)    RBC 2.13 (*)    Hemoglobin 7.0 (*)    HCT 21.2 (*)    RDW 16.6 (*)    Platelets 59 (*)    Neutro Abs 1.6 (*)    Lymphs Abs 0.3 (*)    Eosinophils Relative 6 (*)    All other components within normal limits   BRAIN NATRIURETIC PEPTIDE - Abnormal; Notable for the following:    B Natriuretic Peptide 226.5 (*)    All other components within normal limits  URINALYSIS, ROUTINE W REFLEX MICROSCOPIC (NOT AT [facility]) - Abnormal; Notable for the following:    APPearance CLOUDY (*)    Leukocytes, UA SMALL (*)    All other components within normal limits  URINE MICROSCOPIC-ADD ON - Abnormal; Notable for the following:    Squamous Epithelial / LPF MANY (*)    All other components within normal limits  URINE CULTURE  I-STAT TROPOININ, ED  POC OCCULT BLOOD, ED  TYPE AND SCREEN    HEMOGLOBIN  Date Value Ref Range Status  02/23/2015 7.0* 12.0 - 15.0 g/dL Final  02/03/2015 10.2* 12.0 - 15.0 g/dL Final  01/28/2015 8.6* 12.0 - 15.0 g/dL Final  01/26/2015 8.5* 12.0 - 15.0 g/dL Final   BUN  Date Value Ref Range Status  02/23/2015 103* 6 - 20 mg/dL Final  01/28/2015 57* 6 - 20 mg/dL Final  01/26/2015 63* 6 - 20 mg/dL Final  01/25/2015 66* 6 - 20 mg/dL Final   CREATININE, SER  Date Value Ref Range Status  02/23/2015 1.89* 0.44 - 1.00 mg/dL Final  01/28/2015 1.42* 0.44 - 1.00 mg/dL Final  01/26/2015 1.73* 0.44 - 1.00 mg/dL Final  01/25/2015 1.68* 0.44 - 1.00 mg/dL Final      Imaging Review Dg Chest 2 View  02/23/2015   CLINICAL DATA:  Weakness, bradycardia.  EXAM: CHEST  2 VIEW  COMPARISON:  January 25, 2015.  FINDINGS: Stable cardiomediastinal silhouette. No pneumothorax or pleural effusion is noted. Stable scarring is noted in both lung bases. No acute pulmonary disease is noted. The visualized skeletal structures are unremarkable.  IMPRESSION: No active cardiopulmonary disease.   Electronically Signed   By: Marijo Conception, M.D.   On: 02/23/2015 16:32     EKG Interpretation   Date/Time:  Thursday February 23 2015 15:32:27 EDT Ventricular Rate:  46 PR Interval:  271 QRS Duration: 115 QT Interval:  508 QTC Calculation: 444 R Axis:   -12 Text Interpretation:  Bradycardia with irregular rate  Prolonged PR  interval Nonspecific intraventricular conduction delay Lateral infarct,  age indeterminate Since last tracing rate slower and PR interval has  become prolonged Confirmed by [facility]  MD, Maliyah Willets [phone]) on 02/23/2015  3:41:15 PM      MDM   Final diagnoses:  Anemia, unspecified anemia type  Dehydration  Hypotension, unspecified hypotension type    Weakness with dehydration, and anemia and hypotension. Etiology of this tried is unclear. She does appear volume depleted clinically. Her BUN is elevated, possibly consistent with GI bleeding, although the stool is negative at this time. She will need to be admitted for further observation, treatment with consideration for transfusion versus intervention, by GI. She is hemodynamically stable (with hypotension) and can be admitted to a telemetry bed.  Nursing Notes Reviewed/ Care Coordinated Applicable Imaging Reviewed Interpretation of Laboratory Data incorporated into ED treatment  Plan: Admit  Vira Agar  Eulis Foster, MD 02/23/15 2024

## 2015-02-23 NOTE — Telephone Encounter (Signed)
Laura notified.

## 2015-02-23 NOTE — ED Notes (Signed)
Attempt to call report X1. Secretary stated nurse would call back

## 2015-02-23 NOTE — ED Notes (Signed)
Gave pt [place] sandwich, water peanut butter crackers

## 2015-02-23 NOTE — ED Notes (Signed)
Repaged [facility] for Dr.Wentz @1948 .

## 2015-02-23 NOTE — Telephone Encounter (Signed)
[facility] needs verbal order for home health 2 times 6 weeks, then 1 time a week for 3 week [phone] Mickel Baas

## 2015-02-23 NOTE — Telephone Encounter (Signed)
Reviewed NP's note.

## 2015-02-23 NOTE — Patient Outreach (Signed)
[facility]) Care Management  02/23/2015  [name] May 06, 1941 [id number]   I was called by pt's sister reporting pt condition declined today. She can hardly take a step she is so weak. Her LOC is not as sharp.  I did requested labs Tuesday and results came in last evening and were faxed to Dr. Danise Mina office, I confirmed this, this morning. Pt hgb was 7.3.  Pt's 68 nurse was out earlier and pt refused to go to the ED. I came on out because I didn't like the report and she definitely has declined since I saw her on Tuesday.  BP 60/0 mmHg  Pulse 45  Resp 16  SpO2 98%  Pulse is bradycardic, BP barely palpable or audible. Pt is very pale and rather listless. She is able to answer all my questions.  I called Dr. Danise Mina office and advised I am send her to the ED.  I called 911 and gave report to the Emergency Squad.  I stayed and talked with pt's husband and sister. [name] is a DNR, she has a MOST form. I told them that I didn't think they could provide the level of care she is requiring now and probably needs to go to an ALF. I also talked about possibly getting a palliative care consult while she is in the hospital. Her plan of care needs to be re-evaluated at this time for continued intervention (infusions) or end of life care.  Deloria Lair [facility] [place] [phone]

## 2015-02-24 ENCOUNTER — Inpatient Hospital Stay (HOSPITAL_COMMUNITY): Payer: Medicare Other

## 2015-02-24 ENCOUNTER — Encounter (HOSPITAL_COMMUNITY): Payer: Self-pay | Admitting: *Deleted

## 2015-02-24 DIAGNOSIS — K31819 Angiodysplasia of stomach and duodenum without bleeding: Principal | ICD-10-CM

## 2015-02-24 DIAGNOSIS — N183 Chronic kidney disease, stage 3 (moderate): Secondary | ICD-10-CM

## 2015-02-24 DIAGNOSIS — D500 Iron deficiency anemia secondary to blood loss (chronic): Secondary | ICD-10-CM

## 2015-02-24 DIAGNOSIS — I5032 Chronic diastolic (congestive) heart failure: Secondary | ICD-10-CM

## 2015-02-24 DIAGNOSIS — I10 Essential (primary) hypertension: Secondary | ICD-10-CM

## 2015-02-24 DIAGNOSIS — N179 Acute kidney failure, unspecified: Secondary | ICD-10-CM

## 2015-02-24 DIAGNOSIS — F333 Major depressive disorder, recurrent, severe with psychotic symptoms: Secondary | ICD-10-CM

## 2015-02-24 DIAGNOSIS — M79609 Pain in unspecified limb: Secondary | ICD-10-CM

## 2015-02-24 DIAGNOSIS — K219 Gastro-esophageal reflux disease without esophagitis: Secondary | ICD-10-CM

## 2015-02-24 DIAGNOSIS — D649 Anemia, unspecified: Secondary | ICD-10-CM

## 2015-02-24 DIAGNOSIS — J9611 Chronic respiratory failure with hypoxia: Secondary | ICD-10-CM

## 2015-02-24 LAB — GLUCOSE, CAPILLARY
GLUCOSE-CAPILLARY: 113 mg/dL — AB (ref 65–99)
GLUCOSE-CAPILLARY: 65 mg/dL (ref 65–99)
Glucose-Capillary: 111 mg/dL — ABNORMAL HIGH (ref 65–99)
Glucose-Capillary: 129 mg/dL — ABNORMAL HIGH (ref 65–99)
Glucose-Capillary: 232 mg/dL — ABNORMAL HIGH (ref 65–99)
Glucose-Capillary: 63 mg/dL — ABNORMAL LOW (ref 65–99)
Glucose-Capillary: 64 mg/dL — ABNORMAL LOW (ref 65–99)
Glucose-Capillary: 73 mg/dL (ref 65–99)
Glucose-Capillary: 95 mg/dL (ref 65–99)

## 2015-02-24 LAB — CBC
HEMATOCRIT: 25.1 % — AB (ref 36.0–46.0)
Hemoglobin: 8.3 g/dL — ABNORMAL LOW (ref 12.0–15.0)
MCH: 31.7 pg (ref 26.0–34.0)
MCHC: 33.1 g/dL (ref 30.0–36.0)
MCV: 95.8 fL (ref 78.0–100.0)
Platelets: 55 10*3/uL — ABNORMAL LOW (ref 150–400)
RBC: 2.62 MIL/uL — ABNORMAL LOW (ref 3.87–5.11)
RDW: 18.2 % — AB (ref 11.5–15.5)
WBC: 2.5 10*3/uL — ABNORMAL LOW (ref 4.0–10.5)

## 2015-02-24 LAB — MRSA PCR SCREENING: MRSA BY PCR: NEGATIVE

## 2015-02-24 LAB — COMPREHENSIVE METABOLIC PANEL
ALK PHOS: 80 U/L (ref 38–126)
ALT: 21 U/L (ref 14–54)
ANION GAP: 6 (ref 5–15)
AST: 31 U/L (ref 15–41)
Albumin: 2.8 g/dL — ABNORMAL LOW (ref 3.5–5.0)
BUN: 88 mg/dL — ABNORMAL HIGH (ref 6–20)
CALCIUM: 8.5 mg/dL — AB (ref 8.9–10.3)
CO2: 25 mmol/L (ref 22–32)
Chloride: 113 mmol/L — ABNORMAL HIGH (ref 101–111)
Creatinine, Ser: 1.43 mg/dL — ABNORMAL HIGH (ref 0.44–1.00)
GFR calc non Af Amer: 36 mL/min — ABNORMAL LOW (ref >60)
GFR, EST AFRICAN AMERICAN: 42 mL/min — AB (ref >60)
GLUCOSE: 57 mg/dL — AB (ref 65–99)
Potassium: 4.2 mmol/L (ref 3.5–5.1)
Sodium: 144 mmol/L (ref 135–145)
Total Bilirubin: 1.1 mg/dL (ref 0.3–1.2)
Total Protein: 5 g/dL — ABNORMAL LOW (ref 6.5–8.1)

## 2015-02-24 LAB — BRAIN NATRIURETIC PEPTIDE: B Natriuretic Peptide: 486.8 pg/mL — ABNORMAL HIGH (ref 0.0–100.0)

## 2015-02-24 MED ORDER — DEXTROSE-NACL 5-0.45 % IV SOLN
INTRAVENOUS | Status: DC
  Administered 2015-02-24 – 2015-02-25 (×2): via INTRAVENOUS

## 2015-02-24 MED ORDER — DEXTROSE 50 % IV SOLN
INTRAVENOUS | Status: AC
  Administered 2015-02-24: 25 mL
  Filled 2015-02-24: qty 50

## 2015-02-24 MED ORDER — LEVALBUTEROL HCL 0.63 MG/3ML IN NEBU
0.6300 mg | INHALATION_SOLUTION | Freq: Four times a day (QID) | RESPIRATORY_TRACT | Status: DC | PRN
  Administered 2015-02-25 – 2015-02-27 (×3): 0.63 mg via RESPIRATORY_TRACT
  Filled 2015-02-24 (×3): qty 3

## 2015-02-24 MED ORDER — CETYLPYRIDINIUM CHLORIDE 0.05 % MT LIQD
7.0000 mL | Freq: Two times a day (BID) | OROMUCOSAL | Status: DC
  Administered 2015-02-24 – 2015-02-28 (×7): 7 mL via OROMUCOSAL

## 2015-02-24 MED ORDER — PANTOPRAZOLE SODIUM 40 MG PO TBEC
40.0000 mg | DELAYED_RELEASE_TABLET | Freq: Two times a day (BID) | ORAL | Status: DC
  Administered 2015-02-24 – 2015-02-28 (×9): 40 mg via ORAL
  Filled 2015-02-24 (×9): qty 1

## 2015-02-24 MED ORDER — INSULIN ASPART 100 UNIT/ML ~~LOC~~ SOLN
3.0000 [IU] | Freq: Once | SUBCUTANEOUS | Status: AC
  Administered 2015-02-24: 3 [IU] via SUBCUTANEOUS

## 2015-02-24 NOTE — Consult Note (Signed)
   [facility] Inpatient Consult   02/24/2015  [name] 02-12-1941 [id number]    Patient is active with [facility] services. [facility] NP contacted writer to see if patient could have palliative care consult possibly while inpatient. Spoke with Hospitalist about this and about patient possibly needing SNF placement post discharge. Therapy evals pending. Went to bedside to speak with patient today. She reports she is going down shortly for a procedure. Made her aware that [facility] to continue to follow. Will make inpatient RNCM aware that patient is active with [employer] [facility].  Marthenia Rolling, MSN-Ed, RN,BSN [facility] Liaison [phone]

## 2015-02-24 NOTE — Progress Notes (Addendum)
PT Cancellation Note  Patient Details Name: CHALENE TREU MRN: [id number] DOB: 10-Oct-1940   Cancelled Treatment:    Reason Eval/Treat Not Completed: Patient not medically ready. Noted dopplers to rule out LE DVT now pending.   Rebekka Lobello 02/24/2015, 12:12 PM  Pager [phone]   Addendum (1441): Noted doppler + for RLE DVT. Will defer evaluation until pt has been evaluated by MD and decision made re: anticoagulation.

## 2015-02-24 NOTE — Progress Notes (Signed)
PT Cancellation Note  Patient Details Name: TEYA OTTERSON MRN: [id number] DOB: 1941/06/11   Cancelled Treatment:    Reason Eval/Treat Not Completed: Patient not medically ready. Noted pt on strict bedrest. Will contact MD to clarify if activity orders may be increased.   Mukund Weinreb 02/24/2015, 8:45 AM  Pager [phone]

## 2015-02-24 NOTE — Progress Notes (Signed)
UR COMPLETED  

## 2015-02-24 NOTE — Progress Notes (Signed)
Hypoglycemic Event  CBG: 63  Treatment: D50 IV 25 mL  Symptoms: Hungry  Follow-up CBG: [id number] CBG Result:113  Possible Reasons for Event: npo  Comments/MD notified:dr madera via text    [name]  Remember to initiate Hypoglycemia Order Set & complete

## 2015-02-24 NOTE — Progress Notes (Signed)
[facility] PROGRESS NOTE  [name] UKG:[id number] DOB: [birth year] DOA: 02/23/2015 PCP: Ria Bush, MD  Assessment/Plan: Symptomatic anemia: Hemoglobin dropped from 8.6-7.0 with low blood pressure and lightheadedness sensation. Patient reported multiple black stools at home. Has been on procrit weekly (not given over last 2 weeks as Hgb above 10)  - will continue monitoring in SDU - NPO for possible EGD, GI consulted and will follow rec's - IVF: rate adjusted to 100cc/hr - continue IV pantoprazole 40 mg bid - PRN antiemetics - Avoid NSAIDs and heparin products  - Maintain IV access (2 large bore IVs if possible). - Monitor closely her Hgb trend and transfuse for Hgb 7 or < 7  COPD with chronic resp failure: stable. 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 -will follow blood count  AoCKD-III: Baseline Cre is 1.4-1.7, her Cre is 1.89 on admission. Likely due to prerenal secondary to severe anemia and dehydration, and continuation of diruetics - IVF as above and transfuse as above - Follow up renal function by BMP - Avoid ACEI and NSAIDs - Hold Diuretics, lasix for now -BMET in am  NASH and cirrhosis -Will continue Lactulose and rifaximin -continue Nadolol (Variceal bleed prophylaxis). Due to hypotension, will postpone to start nadolol in AM  Diastolic CHF: Echo: LVEF 76/28/31 60 - 65%. Mild LVH. Appears euvolemic and asymptomatic. -hold diuretics due to hypotension currently -check BNP -daily weights and strict intake and output  DM-II: Last A1c 7.1, fairly well controled. Patient is taking lantus at home -will decrease Lantus dose from 140 to 100 units daily and use SSI -had episode of low CBG, secondary to NPO status. Will monitor and advance diet as soon as possible  Depression and anxiety: no suicidal or homicidal  ideations. -Continue home medications  Right leg pain including tenderness over calf area: -LE doppler ordered -no warm sensation and no swelling  Code Status:Partial (no intubation) Family Communication: caregiver at bedside (Ms. [name]) Disposition Plan: remains inpatient and in stepdown for now   Consultants:  GI  Procedures:  See below for x-ray reports   Antibiotics:  None   HPI/Subjective: No nausea, no vomiting, denies CP and/or SOB. S/p 2 units of PRBC's. Hgb 8.3  Objective: Filed Vitals:   02/24/15 0712  BP: 95/38  Pulse: 49  Temp:   Resp: 21    Intake/Output Summary (Last 24 hours) at 02/24/15 0900 Last data filed at 02/24/15 0600  Gross per 24 hour  Intake 2752.08 ml  Output   1575 ml  Net 1177.08 ml   Filed Weights   02/23/15 1534 02/23/15 2205  Weight: 94.802 kg (209 lb) 100.2 kg (220 lb 14.4 oz)    Exam:   General: afebrile, denies CP or worsening in SOB. No nausea or vomiting   Cardiovascular: positive murmur, no rubs, no gallops; rate controlled  Respiratory: good air movement, no wheezing or crackles on exam  Abdomen: soft, NT, ND, positive BS  Musculoskeletal: no edema or cyanosis   Data Reviewed: Basic Metabolic Panel:  Recent Labs Lab 02/23/15 1556 02/24/15 0813  NA 137 144  K 4.1 4.2  CL 105 113*  CO2 24 25  GLUCOSE 131* 57*  BUN 103* 88*  CREATININE 1.89* 1.43*  CALCIUM 8.9 8.5*   Liver Function Tests:  Recent Labs Lab 02/24/15 0813  AST 31  ALT 21  ALKPHOS 80  BILITOT 1.1  PROT 5.0*  ALBUMIN 2.8*   CBC:  Recent Labs Lab 02/23/15 1556 02/23/15 2112 02/24/15 0813  WBC 2.3* 2.0* 2.5*  NEUTROABS 1.6*  --   --   HGB 7.0* 6.8* 8.3*  HCT 21.2* 20.4* 25.1*  MCV 99.5 99.5 95.8  PLT 59* 56* 55*   BNP (last 3 results)  Recent Labs  01/25/15 0817 02/23/15 1556 02/24/15 0813  BNP 95.5 226.5* 486.8*    ProBNP (last 3 results)  Recent Labs  03/26/14 0259 08/11/14 1755  PROBNP 3352.0* 226.4*     CBG:  Recent Labs Lab 02/24/15 0014 02/24/15 0759 02/24/15 0801 02/24/15 0828  GLUCAP 129* 64* 63* 113*    Studies: Dg Chest 2 View  02/23/2015   CLINICAL DATA:  Weakness, bradycardia.  EXAM: CHEST  2 VIEW  COMPARISON:  January 25, 2015.  FINDINGS: Stable cardiomediastinal silhouette. No pneumothorax or pleural effusion is noted. Stable scarring is noted in both lung bases. No acute pulmonary disease is noted. The visualized skeletal structures are unremarkable.  IMPRESSION: No active cardiopulmonary disease.   Electronically Signed   By: Marijo Conception, M.D.   On: 02/23/2015 16:32    Scheduled Meds: . antiseptic oral rinse  7 mL Mouth Rinse BID  . b complex vitamins  1 tablet Oral Daily  . buPROPion  150 mg Oral Daily  . desvenlafaxine  150 mg Oral Daily  . guaiFENesin  600 mg Oral BID  . insulin aspart  0-15 Units Subcutaneous TID WC  . insulin glargine  100 Units Subcutaneous BH-q7a  . levalbuterol  0.63 mg Nebulization TID  . loratadine  10 mg Oral Daily  . mometasone-formoterol  2 puff Inhalation BID  . multivitamin with minerals  1 tablet Oral Daily  . nadolol  10 mg Oral Daily  . pantoprazole (PROTONIX) IV  40 mg Intravenous Q12H  . QUEtiapine  200 mg Oral QHS  . rifaximin  550 mg Oral Daily  . rOPINIRole  4 mg Oral QHS  . simvastatin  40 mg Oral QPM  . sodium chloride  3 mL Intravenous Q12H   Continuous Infusions: . sodium chloride Stopped (02/24/15 0105)    Principal Problem:   Symptomatic anemia Active Problems:   HYPERCHOLESTEROLEMIA   Iron deficiency anemia due to chronic blood loss   Severe recurrent major depressive disorder with psychotic features   RESTLESS LEGS SYNDROME   Essential hypertension   Coronary atherosclerosis   COPD (chronic obstructive pulmonary disease) with chronic bronchitis   GERD   Autoimmune hepatitis   Osteoporosis   Obesity, Class II, BMI 35-39.9, with comorbidity   GAVE (gastric antral vascular ectasia)   Other  pancytopenia   Chronic diastolic CHF (congestive heart failure)   Acute renal failure superimposed on stage 3 chronic kidney disease   Hepatic cirrhosis   Chronic respiratory failure   Bradycardia   Diabetes mellitus type 2 with complications, uncontrolled   Anemia    Time spent: 40 minutes (> 50 % of time dedicated to face to face examination, update to family members/significant others at bedside and coordination of care)    [place], [facility] Pager [phone]. If 7PM-7AM, please contact night-coverage at [email or web address], password [facility] 02/24/2015, 9:00 AM  LOS: 1 day

## 2015-02-24 NOTE — Progress Notes (Signed)
*  PRELIMINARY RESULTS* Vascular Ultrasound Right lower extremity venous duplex has been completed.  Preliminary findings: In the right popliteal fossa, there is an area that could be an atypical, elongated cystic structure vs thrombus in an unnamed vein. There also appears to be a short segment of thrombus in the proximal right anterior tibial veins. No other thrombus is noted in the right lower extremity.   Landry Mellow, RDMS, RVT  02/24/2015, 2:25 PM

## 2015-02-24 NOTE — Consult Note (Signed)
[facility] Consult: 9:06 AM 02/24/2015  LOS: 1 day    Referring Provider: Dr Dyann Kief.  Primary Care Physician:  Ria Bush, MD Primary Gastroenterologist:  Dr. Olevia Perches    Reason for Consultation:  Recurrent anemia.    HPI: [name] is a 74 y.o. female.  Problems/hx include morbid obesity, IDDM, OSA but does not toleerate CPAP.   NASH (1990s), cirrhosis (liver bx 2003), portal htn, GAVE.  Chronic recurring GI blood loss anemia. S/p multiple EGDs with GAVE ablation dating back to at least 2001.  Treated with Aranesp, periodic blood transfusion and previously with parenteral iron.  Last transfusion 07/2014.  Hepatic encephalopathy.   Morbid obesity. O2 dependent COPD, CKD, CHF.  Splenomegaly but no ascites on 03/2014 ultrasound.   Thrombocytopenia but no hx coagulopathy.     Admission 6/1-01/28/15 with COPD flare, discharged to SNF, hgb of 8.6 (basline 8.5 to low 9's) .  Since leaving SNF on 02/17/15 has had progressive dyspnea.  Hgb 6/10 was 10.2 so she did not require Aranesp. This is unusual for her, as she usually meets criteria for receiving Aranesp every 2 weeks.  It has been a few years since she received parenteral iron.  Lat iron studies in 07/2014 did not show iron deficiency.  Back to ED yesterday and admitted with Hgb 6.8.  AKI. Mild increase PT to 15.4, INR 1.2.  Stool is actually FOBT negative, unusual for her.   Saw scant red blood per rectum with wiping while at SNF on 1 to 2 occasions.  Also has intermittently dark, not tarry, stools intermittently.  No nausea.  Appetite excellent, but has lost 20 # in last 6 to 8 weeks. Currently her dysphagia is mostly when she swallows breads.    Past Medical History  Diagnosis Date  . Chronic airway obstruction, not elsewhere classified   . Unspecified  chronic bronchitis   . Unspecified essential hypertension   . Non-obstructive CAD   . Palpitations   . Mild aortic stenosis     a. 03/2014 Valve area (VTI): 2.89 cm^2, Valve area (Vmax): 2.7 cm^2.  . Pure hypercholesterolemia   . Morbid obesity   . Esophageal reflux   . GAVE (gastric antral vascular ectasia)     angiodysplasia  . Diverticulosis of colon (without mention of hemorrhage)   . Benign neoplasm of colon   . Osteoarthrosis, unspecified whether generalized or localized, unspecified site   . Osteoporosis, unspecified   . Restless legs syndrome (RLS)   . Major depressive disorder, recurrent episode, severe, specified as with psychotic behavior   . Iron deficiency anemia secondary to blood loss (chronic)     a. frequent PRBC transfusions.  . Coarse tremors     a. arms.  . Carpal tunnel syndrome on both sides   . Chronic diastolic CHF (congestive heart failure)     a. 03/26/2014 Echo: EF 55-60%, no rwma, mild AS/AI, mod-sev Ca2+ MV annulus, mildly to mod dil LA.  [name] Asthma   . Type II diabetes mellitus   . H/O hiatal hernia   .  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 dx'd 1990's  . Adenomatous colon polyp   . Midsternal chest pain     a. conservatively managed ->poor candidate for cath/anticoagulation.  [name] Anxiety   . Portal hypertensive gastropathy   . Falls frequently     completed HHPT/OT 06/2014, PT unmet goals  . Recurrent UTI (urinary tract infection)     h/o hospitalization with urosepsis 2015, but thought large component colonization/bacteriuria, only treat if symptomatic ([place])  . Hiatal hernia   . Thrombocytopenia   . Protein calorie malnutrition   . History of pneumonia     CAP 2015  . On home oxygen therapy     "3L; 24/7" (01/25/2015)  . OSA (obstructive sleep apnea)     a. does not use CPAP. (01/25/2015)  . GAVE (gastric antral vascular ectasia)     Past Surgical History  Procedure Laterality Date  . Hemorroidectomy    .  Appendectomy    . Vaginal hysterectomy    . Breast biopsy      bilateral  . Cesarean section      x 3  . Appendectomy    . Esophagogastroduodenoscopy  06/12/2012    Procedure: ESOPHAGOGASTRODUODENOSCOPY (EGD);  Surgeon: Lafayette Dragon, MD;  Location: [facility];  Service: Endoscopy;  Laterality: N/A;  . Balloon dilation  06/12/2012    Procedure: BALLOON DILATION;  Surgeon: Lafayette Dragon, MD;  Location: [facility];  Service: Endoscopy;  Laterality: N/A;  ?balloon  . Esophagogastroduodenoscopy  09/10/2012    Procedure: ESOPHAGOGASTRODUODENOSCOPY (EGD);  Surgeon: Lafayette Dragon, MD;  Location: [facility];  Service: Endoscopy;  Laterality: N/A;  . Hot hemostasis  09/10/2012    Procedure: HOT HEMOSTASIS (ARGON PLASMA COAGULATION/BICAP);  Surgeon: Lafayette Dragon, MD;  Location: [facility];  Service: Endoscopy;  Laterality: N/A;  . Esophagogastroduodenoscopy N/A 01/18/2013    Procedure: ESOPHAGOGASTRODUODENOSCOPY (EGD);  Surgeon: Lafayette Dragon, MD;  Location: [facility];  Service: Endoscopy;  Laterality: N/A;  . Esophagogastroduodenoscopy N/A 05/24/2013    Procedure: ESOPHAGOGASTRODUODENOSCOPY (EGD);  Surgeon: Jerene Bears, MD;  Location: [facility];  Service: Gastroenterology;  Laterality: N/A;  . Hot hemostasis N/A 05/24/2013    Procedure: HOT HEMOSTASIS (ARGON PLASMA COAGULATION/BICAP);  Surgeon: Jerene Bears, MD;  Location: [facility];  Service: Gastroenterology;  Laterality: N/A;  . US echocardiography  07/2014    mild LVH, EF 60-65%, normal wall motion, mild AR, mod dilated LA, mildly dilated RA, peak PA pressure 40mmHg  . Esophagogastroduodenoscopy N/A 10/20/2014    Procedure: ESOPHAGOGASTRODUODENOSCOPY (EGD);  Surgeon: Lafayette Dragon, MD;  Location: [facility];  Service: Endoscopy;  Laterality: N/A;  . Hot hemostasis N/A 10/20/2014    Procedure: HOT HEMOSTASIS (ARGON PLASMA COAGULATION/BICAP);  Surgeon: Lafayette Dragon, MD;  Location: [facility];  Service: Endoscopy;  Laterality: N/A;     Prior to Admission medications   Medication Sig Start Date End Date Taking? Authorizing Provider  ADVAIR DISKUS 250-50 MCG/DOSE AEPB Inhale 1 puff into the lungs 2 (two) times daily. 01/15/15  Yes Historical Provider, MD  albuterol (PROAIR HFA) 108 (90 BASE) MCG/ACT inhaler Inhale 2 puffs into the lungs every 6 (six) hours as needed for wheezing or shortness of breath.    Yes Historical Provider, MD  ALPRAZolam Duanne Moron) 1 MG tablet TAKE 1 TABLET BY MOUTH EVERY 8 HOURS AS NEEDED FOR ANXIETY 02/08/15  Yes Ria Bush, MD  b complex vitamins tablet Take 1 tablet by mouth daily.  Yes Historical Provider, MD  buPROPion (WELLBUTRIN XL) 150 MG 24 hr tablet Take 1 tablet (150 mg total) by mouth daily. 12/19/14  Yes Ria Bush, MD  darbepoetin (ARANESP) 200 MCG/0.4ML SOLN injection Inject 200 mcg into the skin every 14 (fourteen) days.   Yes Historical Provider, MD  furosemide (LASIX) 40 MG tablet Take 40 mg by mouth at bedtime. 80 mg in the morning and 40 mg in the afternoon 03/31/14  Yes Belkys A Regalado, MD  furosemide (LASIX) 80 MG tablet Take 80 mg by mouth daily.   Yes Historical Provider, MD  guaiFENesin (MUCINEX) 600 MG 12 hr tablet Take 600 mg by mouth 2 (two) times daily. 2 tablet by mouth once daily   Yes Historical Provider, MD  Insulin Glargine (LANTUS SOLOSTAR) 100 UNIT/ML Solostar Pen Inject 140 Units into the skin every morning. 01/03/15  Yes Renato Shin, MD  insulin lispro (HUMALOG) 100 UNIT/ML KiwkPen INJECT 5-30 UNITS INTO THE SKIN AT 4PM PER SLIDING SCALE (5 for every 50 over 150) 12/19/14  Yes Ria Bush, MD  lactulose, encephalopathy, (CHRONULAC) 10 GM/15ML SOLN Take 30 mLs (20 g total) by mouth 2 (two) times daily. constipation Patient taking differently: Take 20 g by mouth daily as needed (constipation). constipation 07/14/14  Yes Lori P Hvozdovic, PA-C  levalbuterol (XOPENEX) 0.63 MG/3ML nebulizer solution Take 0.63 mg by nebulization 3 (three) times daily.  06/05/14   Yes Historical Provider, MD  loratadine (CLARITIN) 10 MG tablet TAKE 1 TABLET EVERY DAY 02/08/15  Yes Ria Bush, MD  Multiple Vitamins-Minerals (CENTRUM SILVER PO) Take 1 tablet by mouth daily.    Yes Historical Provider, MD  nadolol (CORGARD) 20 MG tablet TAKE 1/2 TABLET BY MOUTH DAILY 11/25/14  Yes Sherren Mocha, MD  oxyCODONE (OXY IR/ROXICODONE) 5 MG immediate release tablet Take one tablet by mouth every 4 hours as needed for pain 01/28/15  Yes Costin Karlyne Greenspan, MD  pantoprazole (PROTONIX) 40 MG tablet Take 1 tablet (40 mg total) by mouth 2 (two) times daily. 12/05/14  Yes Ria Bush, MD  PRISTIQ 50 MG 24 hr tablet TAKE 3 TABLETS BY MOUTH EVERY DAY 11/17/14  Yes Ria Bush, MD  promethazine (PHENERGAN) 25 MG tablet Take 1 tablet (25 mg total) by mouth 2 (two) times daily as needed for nausea. 06/09/14  Yes Ria Bush, MD  QUEtiapine (SEROQUEL) 200 MG tablet Take 1 tablet (200 mg total) by mouth at bedtime. 12/05/14  Yes Ria Bush, MD  rOPINIRole (REQUIP) 4 MG tablet Take 1 tablet (4 mg total) by mouth at bedtime. 01/16/15  Yes Ria Bush, MD  [facility] NASAL SPRAY NA) Place 1 spray into the nose 3 (three) times daily as needed (congestion).    Yes Historical Provider, MD  simvastatin (ZOCOR) 40 MG tablet TAKE 1 TABLET BY MOUTH EVERY EVENING 11/23/14  Yes Ria Bush, MD  spironolactone (ALDACTONE) 25 MG tablet TAKE 1/2 TABLET BY MOUTH DAILY 12/19/14  Yes Sherren Mocha, MD  Vitamin D, Ergocalciferol, (DRISDOL) 50000 UNITS CAPS capsule TAKE 1 CAPSULE (50,000 UNITS TOTAL) BY MOUTH EVERY 7 (SEVEN) DAYS. TAKE ON FRIDAY 05/16/14  Yes Ria Bush, MD  XIFAXAN 550 MG TABS tablet TAKE 1 TABLET (550 MG TOTAL) BY MOUTH DAILY. 01/12/15  Yes Lafayette Dragon, MD    Scheduled Meds: . antiseptic oral rinse  7 mL Mouth Rinse BID  . b complex vitamins  1 tablet Oral Daily  . buPROPion  150 mg Oral Daily  . desvenlafaxine  150 mg Oral Daily  .  guaiFENesin  600 mg Oral  BID  . insulin aspart  0-15 Units Subcutaneous TID WC  . insulin glargine  100 Units Subcutaneous BH-q7a  . levalbuterol  0.63 mg Nebulization TID  . loratadine  10 mg Oral Daily  . mometasone-formoterol  2 puff Inhalation BID  . multivitamin with minerals  1 tablet Oral Daily  . nadolol  10 mg Oral Daily  . pantoprazole (PROTONIX) IV  40 mg Intravenous Q12H  . QUEtiapine  200 mg Oral QHS  . rifaximin  550 mg Oral Daily  . rOPINIRole  4 mg Oral QHS  . simvastatin  40 mg Oral QPM  . sodium chloride  3 mL Intravenous Q12H   Infusions: . sodium chloride Stopped (02/24/15 0105)   PRN Meds: albuterol, ALPRAZolam, lactulose, ondansetron **OR** ondansetron (ZOFRAN) IV, oxyCODONE, sodium chloride   Allergies as of 02/23/2015 - Review Complete 02/23/2015  Allergen Reaction Noted  . Acetaminophen Other (See Comments) 08/11/2014  . Aspirin Other (See Comments) 12/11/2010  . Theophylline Nausea And Vomiting   . Penicillins Itching     Family History  Problem Relation Age of Onset  . Heart disease Mother   . Cervical cancer Mother   . Kidney disease Mother   . Diabetes Mother   . Breast cancer Sister   . Multiple sclerosis Sister   . Colon cancer Neg Hx   . Breast cancer Maternal Aunt     x 2  . Diabetes Father   . Diabetes Sister   . Multiple sclerosis Other   . Heart attack Mother   . Heart attack Father   . Stroke Daughter   . Stroke Daughter     History   Social History  . Marital Status: Married    Spouse Name: jerry Kleiman  . Number of Children: 4  . Years of Education: 12   Occupational History  . Disabled   .     Social History Main Topics  . Smoking status: Former Smoker -- 1.50 packs/day for 25 years    Types: Cigarettes    Quit date: 08/26/1992  . Smokeless tobacco: Never Used  . Alcohol Use: No     Comment: 5/78/46 "last alcoholic drink was years ago"  . Drug Use: No  . Sexual Activity: No   Other Topics Concern  . Not on file   Social History  Narrative   Patient lives at home with her husband [name]).    Retired.   Caffeine- None    Right handed.      Dr. Olevia Perches GI    Dr. Burt Knack cards    Dr. Loanne Drilling endo   Psych: Followed by [facility] (ph [phone], fax [phone]).     REVIEW OF SYSTEMS: Constitutional:  20 or so # weight loss in last 6 to 8 weeks.   ENT:  No nose bleeds Pulm:  No dyspnea or cough CV:  No palpitations, no LE edema.  GU:  No hematuria, no frequency GI:  Per HPI Heme:  Per HPI   Transfusions:  Per HPI Neuro:  No headaches, no peripheral tingling or numbness Derm:  No itching, no rash or sores.  Endocrine:  No sweats or chills.  No polyuria or dysuria Immunization:  Reviewed.  Up to date on pnvx, flu shots.  Travel:  None beyond local counties in last few months.    PHYSICAL EXAM: Vital signs in last 24 hours: Filed Vitals:   02/24/15 0712  BP: 95/38  Pulse: 49  Temp:  Resp: 21   Wt Readings from Last 3 Encounters:  02/23/15 220 lb 14.4 oz (100.2 kg)  02/21/15 209 lb (94.802 kg)  01/28/15 211 lb 3.2 oz (95.8 kg)    General: obese, comfortable.  Looks chronically unwell, not acutely ill.  Head:  No swelling or asymmetry  Eyes:  No icterus or pallor Ears:  Slight HOH  Nose:  No discharge or congestion Mouth:  Clear, moist, fair dentition.  No sores Neck:  No mass, no JVD.  No TMG Lungs:  Clear bil.  No sough or dyspnea Heart: RRR.  No mrg Abdomen:  Soft, NT, obese, healing ulcers in various spots on abdomen.  Some small, injection site type, bruises.  No masses, hernias or HSM.   Rectal: deferred.  Tested FOBT negative in last 24 hours.    Musc/Skeltl: no joint swelling or redness Extremities:  No CCE.  Feet warm.   Neurologic:  Oriented x 3, no akathisia, no tremors.  Moves all 4 liimbs, strength not tested.  Skin:  No tekangectasias. Purpura on her back.  Healing sites of ulcers on abdomen Tattoos:  none Nodes:  No cervical adenopathy   Psych:  Pleasant, oriented x 3.   Very slight psycho-motor retardation.   Intake/Output from previous day: 06/30 0701 - 07/01 0700 In: 2752.1 [I.V.:2052.1; Blood:700] Out: 1575 [MEQAS:3419] Intake/Output this shift:    LAB RESULTS:  Recent Labs  02/23/15 1556 02/23/15 2112 02/24/15 0813  WBC 2.3* 2.0* 2.5*  HGB 7.0* 6.8* 8.3*  HCT 21.2* 20.4* 25.1*  PLT 59* 56* 55*   BMET Lab Results  Component Value Date   NA 144 02/24/2015   NA 137 02/23/2015   NA 138 01/28/2015   K 4.2 02/24/2015   K 4.1 02/23/2015   K 3.7 01/28/2015   CL 113* 02/24/2015   CL 105 02/23/2015   CL 101 01/28/2015   CO2 25 02/24/2015   CO2 24 02/23/2015   CO2 28 01/28/2015   GLUCOSE 57* 02/24/2015   GLUCOSE 131* 02/23/2015   GLUCOSE 141* 01/28/2015   BUN 88* 02/24/2015   BUN 103* 02/23/2015   BUN 57* 01/28/2015   CREATININE 1.43* 02/24/2015   CREATININE 1.89* 02/23/2015   CREATININE 1.42* 01/28/2015   CALCIUM 8.5* 02/24/2015   CALCIUM 8.9 02/23/2015   CALCIUM 9.3 01/28/2015   LFT  Recent Labs  02/24/15 0813  PROT 5.0*  ALBUMIN 2.8*  AST 31  ALT 21  ALKPHOS 80  BILITOT 1.1   PT/INR Lab Results  Component Value Date   INR 1.20 02/23/2015   INR 1.0 12/20/2013   INR 1.05 05/23/2013   Hepatitis Panel No results for input(s): HEPBSAG, HCVAB, HEPAIGM, HEPBIGM in the last 72 hours. C-Diff No components found for: CDIFF Lipase     Component Value Date/Time   LIPASE 50 02/02/2013 0518    Drugs of Abuse     Component Value Date/Time   LABOPIA NONE DETECTED 02/17/2013 2239   COCAINSCRNUR NONE DETECTED 02/17/2013 2239   LABBENZ POSITIVE* 02/17/2013 2239   AMPHETMU NONE DETECTED 02/17/2013 2239   THCU NONE DETECTED 02/17/2013 2239   LABBARB NONE DETECTED 02/17/2013 2239     RADIOLOGY STUDIES: Dg Chest 2 View  02/23/2015   CLINICAL DATA:  Weakness, bradycardia.  EXAM: CHEST  2 VIEW  COMPARISON:  January 25, 2015.  FINDINGS: Stable cardiomediastinal silhouette. No pneumothorax or pleural effusion is noted. Stable  scarring is noted in both lung bases. No acute pulmonary disease is noted. The visualized skeletal  structures are unremarkable.  IMPRESSION: No active cardiopulmonary disease.   Electronically Signed   By: Marijo Conception, M.D.   On: 02/23/2015 16:32    ENDOSCOPIC STUDIES: 10/23/14  EGD for iron def anemia, hx GAVE.  1. GAVE syndrome, gastric antru, 2. mild portal gastropathy 3.no esophageal or gastric varices 4.APC ablation of vascular ectasias carried out successfully 5. suspected Candida esophagitis Plan: continue Aranesp, Diflucan po.   03/2014 Barium esophagram.   Negative study, no dysphagia or stricture.  04/2013 EGD with ablation AVMs.   01/2010 Capsule endoscopy:  AVMs.  04/2008  EGD with dilation of duodenal bulb, but no GAVE seen  05/2007 EGD  03/2008 Colonoscopy Portal htn changes in mucosa, polyps at rectum and anus.  Pathology: one serrated adenoma, one hyperplastic polyp.   09/2004 EGD With ablation of GAVE and duodenal AVMs, ablation of gastric pseudopolyps.    IMPRESSION:   *  Acute on chronic anemia, symptomatic.   *  Long hx of NASH and cirrhosis.    *  OSA, COPD.  hom O2 dependent.   *  IDDM    PLAN:     *  Enteroscopy.  4pm.  Ok to have meds with apple juice this AM.     Azucena Freed  02/24/2015, 9:06 AM Pager: [phone]

## 2015-02-25 ENCOUNTER — Encounter (HOSPITAL_COMMUNITY)
Admission: EM | Disposition: A | Discharge status: Skilled Nursing Facility | Payer: Self-pay | Source: Home / Self Care | Attending: Internal Medicine

## 2015-02-25 ENCOUNTER — Encounter (HOSPITAL_COMMUNITY): Payer: Self-pay | Admitting: *Deleted

## 2015-02-25 DIAGNOSIS — E118 Type 2 diabetes mellitus with unspecified complications: Secondary | ICD-10-CM

## 2015-02-25 DIAGNOSIS — J449 Chronic obstructive pulmonary disease, unspecified: Secondary | ICD-10-CM

## 2015-02-25 HISTORY — PX: ENTEROSCOPY: SHX5533

## 2015-02-25 LAB — GLUCOSE, CAPILLARY
GLUCOSE-CAPILLARY: 106 mg/dL — AB (ref 65–99)
GLUCOSE-CAPILLARY: 96 mg/dL (ref 65–99)
Glucose-Capillary: 189 mg/dL — ABNORMAL HIGH (ref 65–99)
Glucose-Capillary: 169 mg/dL — ABNORMAL HIGH (ref 65–99)

## 2015-02-25 LAB — CBC
HEMATOCRIT: 26.6 % — AB (ref 36.0–46.0)
HEMOGLOBIN: 8.7 g/dL — AB (ref 12.0–15.0)
MCH: 31.8 pg (ref 26.0–34.0)
MCHC: 32.7 g/dL (ref 30.0–36.0)
MCV: 97.1 fL (ref 78.0–100.0)
Platelets: 62 10*3/uL — ABNORMAL LOW (ref 150–400)
RBC: 2.74 MIL/uL — ABNORMAL LOW (ref 3.87–5.11)
RDW: 18.7 % — ABNORMAL HIGH (ref 11.5–15.5)
WBC: 2.7 10*3/uL — ABNORMAL LOW (ref 4.0–10.5)

## 2015-02-25 LAB — URINE CULTURE: Special Requests: NORMAL

## 2015-02-25 SURGERY — ENTEROSCOPY
Anesthesia: Moderate Sedation

## 2015-02-25 MED ORDER — FUROSEMIDE 10 MG/ML IJ SOLN
40.0000 mg | Freq: Once | INTRAMUSCULAR | Status: AC
  Administered 2015-02-25: 40 mg via INTRAVENOUS
  Filled 2015-02-25: qty 4

## 2015-02-25 MED ORDER — FENTANYL CITRATE (PF) 100 MCG/2ML IJ SOLN
INTRAMUSCULAR | Status: DC | PRN
  Administered 2015-02-25 (×3): 25 ug via INTRAVENOUS

## 2015-02-25 MED ORDER — MIDAZOLAM HCL 5 MG/ML IJ SOLN
INTRAMUSCULAR | Status: AC
  Filled 2015-02-25: qty 3

## 2015-02-25 MED ORDER — DIPHENHYDRAMINE HCL 50 MG/ML IJ SOLN
INTRAMUSCULAR | Status: AC
  Filled 2015-02-25: qty 1

## 2015-02-25 MED ORDER — FUROSEMIDE 40 MG PO TABS
40.0000 mg | ORAL_TABLET | Freq: Two times a day (BID) | ORAL | Status: DC
  Administered 2015-02-26 – 2015-02-28 (×5): 40 mg via ORAL
  Filled 2015-02-25 (×7): qty 1

## 2015-02-25 MED ORDER — MIDAZOLAM HCL 10 MG/2ML IJ SOLN
INTRAMUSCULAR | Status: DC | PRN
  Administered 2015-02-25: 2 mg via INTRAVENOUS
  Administered 2015-02-25: 1 mg via INTRAVENOUS
  Administered 2015-02-25: 2 mg via INTRAVENOUS

## 2015-02-25 MED ORDER — FENTANYL CITRATE (PF) 100 MCG/2ML IJ SOLN
INTRAMUSCULAR | Status: AC
  Filled 2015-02-25: qty 4

## 2015-02-25 NOTE — Progress Notes (Signed)
[facility] PROGRESS NOTE  [name]:[id number] DOB: 05-16-1941 DOA: 02/23/2015 PCP: Ria Bush, MD  Assessment/Plan: Symptomatic anemia: Hemoglobin dropped from 8.6-7.0 with low blood pressure and lightheadedness sensation. Patient reported multiple black stools at home. Has been on procrit weekly (not given over last 2 weeks as Hgb above 10)  - will continue monitoring in SDU - NPO for EGD/enteroscopy later today (7/2), GI on board; will follow rec's - IVF: rate adjusted to 75 cc/hr - continue IV pantoprazole 40 mg bid - PRN antiemetics - Avoid NSAIDs and heparin products  - Maintain IV access (2 large bore IVs if possible). - Monitor closely her Hgb trend and transfuse for Hgb 7 or < 7  COPD with chronic resp failure: stable. 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 -will follow blood count  AoCKD-III: Baseline Cre is 1.4-1.7, her Cre is 1.89 on admission. Likely due to prerenal secondary to severe anemia and dehydration, and continuation of diruetics - will adjust IVF rate - Follow up renal function; Cr today back to 1.4 range - Avoid ACEI and NSAIDs - continue holding lasix for now   NASH and cirrhosis -Will continue Lactulose and rifaximin -continue Nadolol (Variceal bleed prophylaxis).  Diastolic CHF: Echo: LVEF 83/25/49 60 - 65%. Mild LVH. Appears euvolemic and asymptomatic. -hold diuretics due to hypotension currently -BNP 486.8 -daily weights and strict intake and output -no SOB  DM-II: Last A1c 7.1, fairly well controled. Patient is taking lantus at home -will decrease Lantus dose from 140 to 100 units daily and use SSI; holding parameters discussed with nursing staff given NPO status -had episode of low CBG, secondary to NPO status. Will monitor and advance diet as soon as possible  Depression and anxiety: no suicidal  or homicidal ideations. -Continue home medications  Right leg pain including tenderness over calf area: -LE doppler demonstrated no DVT -no warm sensation and no swelling  HLD: continue zocor  Code Status:Partial (no intubation) Family Communication: caregiver at bedside (Ms. [name]) Disposition Plan: remains inpatient and in stepdown for now   Consultants:  GI  Procedures:  See below for x-ray reports   EGD/enteroscopy: 7/2  Antibiotics:  None   HPI/Subjective: No nausea, no vomiting, denies CP and/or SOB. Hgb 8.7; plan is for EGD/enteroscopy this morning.  Objective: Filed Vitals:   02/25/15 0754  BP: 117/30  Pulse: 50  Temp: 98.2 F (36.8 C)  Resp: 16    Intake/Output Summary (Last 24 hours) at 02/25/15 0847 Last data filed at 02/25/15 0400  Gross per 24 hour  Intake   1085 ml  Output   1150 ml  Net    -65 ml   Filed Weights   02/23/15 2205 02/24/15 2258 02/25/15 0500  Weight: 100.2 kg (220 lb 14.4 oz) 100.608 kg (221 lb 12.8 oz) 101.9 kg (224 lb 10.4 oz)    Exam:   General: afebrile, denies CP or worsening in SOB. No nausea or vomiting. No hematemesis. Hgb 8.7   Cardiovascular: positive murmur, no rubs, no gallops; rate controlled  Respiratory: good air movement, no wheezing or crackles on exam  Abdomen: soft, NT, ND, positive BS  Musculoskeletal: no edema or cyanosis   Data Reviewed: Basic Metabolic Panel:  Recent Labs Lab 02/23/15 1556 02/24/15 0813  NA 137 144  K 4.1 4.2  CL 105 113*  CO2 24 25  GLUCOSE 131* 57*  BUN 103* 88*  CREATININE 1.89* 1.43*  CALCIUM 8.9 8.5*   Liver Function Tests:  Recent Labs Lab 02/24/15 0813  AST 31  ALT 21  ALKPHOS 80  BILITOT 1.1  PROT 5.0*  ALBUMIN 2.8*   CBC:  Recent Labs Lab 02/23/15 1556 02/23/15 2112 02/24/15 0813 02/25/15 0308  WBC 2.3* 2.0* 2.5* 2.7*  NEUTROABS 1.6*  --   --   --   HGB 7.0* 6.8* 8.3* 8.7*  HCT 21.2* 20.4* 25.1* 26.6*  MCV 99.5 99.5 95.8 97.1  PLT  59* 56* 55* 62*   BNP (last 3 results)  Recent Labs  01/25/15 0817 02/23/15 1556 02/24/15 0813  BNP 95.5 226.5* 486.8*    ProBNP (last 3 results)  Recent Labs  03/26/14 0259 08/11/14 1755  PROBNP 3352.0* 226.4*    CBG:  Recent Labs Lab 02/24/15 1218 02/24/15 1328 02/24/15 1626 02/24/15 2100 02/25/15 0752  GLUCAP 65 111* 95 232* 106*    Studies: Dg Chest 2 View  02/23/2015   CLINICAL DATA:  Weakness, bradycardia.  EXAM: CHEST  2 VIEW  COMPARISON:  January 25, 2015.  FINDINGS: Stable cardiomediastinal silhouette. No pneumothorax or pleural effusion is noted. Stable scarring is noted in both lung bases. No acute pulmonary disease is noted. The visualized skeletal structures are unremarkable.  IMPRESSION: No active cardiopulmonary disease.   Electronically Signed   By: Marijo Conception, M.D.   On: 02/23/2015 16:32    Scheduled Meds: . antiseptic oral rinse  7 mL Mouth Rinse BID  . b complex vitamins  1 tablet Oral Daily  . buPROPion  150 mg Oral Daily  . desvenlafaxine  150 mg Oral Daily  . guaiFENesin  600 mg Oral BID  . insulin aspart  0-15 Units Subcutaneous TID WC  . insulin glargine  100 Units Subcutaneous BH-q7a  . loratadine  10 mg Oral Daily  . mometasone-formoterol  2 puff Inhalation BID  . multivitamin with minerals  1 tablet Oral Daily  . nadolol  10 mg Oral Daily  . pantoprazole  40 mg Oral BID  . QUEtiapine  200 mg Oral QHS  . rifaximin  550 mg Oral Daily  . rOPINIRole  4 mg Oral QHS  . simvastatin  40 mg Oral QPM  . sodium chloride  3 mL Intravenous Q12H   Continuous Infusions: . dextrose 5 % and 0.45% NaCl 75 mL/hr at 02/25/15 0508    Principal Problem:   Symptomatic anemia Active Problems:   HYPERCHOLESTEROLEMIA   Iron deficiency anemia due to chronic blood loss   Severe recurrent major depressive disorder with psychotic features   RESTLESS LEGS SYNDROME   Essential hypertension   Coronary atherosclerosis   COPD (chronic obstructive  pulmonary disease) with chronic bronchitis   GERD   Autoimmune hepatitis   Osteoporosis   Obesity, Class II, BMI 35-39.9, with comorbidity   GAVE (gastric antral vascular ectasia)   Other pancytopenia   Chronic diastolic CHF (congestive heart failure)   Acute renal failure superimposed on stage 3 chronic kidney disease   Hepatic cirrhosis   Chronic respiratory failure   Bradycardia   Diabetes mellitus type 2 with complications, uncontrolled   Anemia    Time spent: 40 minutes (> 50 % of time dedicated to face to face examination, update to family members/significant others at bedside and coordination of care)    [place], [facility] Pager [phone]. If 7PM-7AM, please contact night-coverage at [email or web address], password [facility] 02/25/2015, 8:47 AM  LOS: 2  days

## 2015-02-25 NOTE — Evaluation (Addendum)
Physical Therapy Evaluation Patient Details Name: [name] MRN: [id number] DOB: 1940/12/16 Today's Date: 02/25/2015   History of Present Illness  Patient is a 74 yo female admitted 02/23/15 with weakness and hypotension.  Patient with symptomatic anemia.  Verified with RN and MD - Doppler RLE negative for DVT.    PMH:  Recently hospitalized > SNF > home day prior to this admisison; COPD, HTN, CAD, CHF, DM, asthma, RLS, depression with psychotic behavior, NASH, anemia  Clinical Impression  Patient presents with problems listed below.  Will benefit from acute PT to maximize independence prior to discharge. Patient very deconditioned with decreased balance, impacting functional mobility.  Would recommend SNF at discharge for continued therapy.    Follow Up Recommendations SNF;Supervision/Assistance - 24 hour    Equipment Recommendations  None recommended by PT    Recommendations for Other Services       Precautions / Restrictions Precautions Precautions: Fall Precaution Comments: h/o frequent falls Restrictions Weight Bearing Restrictions: No      Mobility  Bed Mobility Overal bed mobility: Needs Assistance Bed Mobility: Supine to Sit     Supine to sit: Mod assist;+2 for safety/equipment     General bed mobility comments: Assist to raise trunk to sitting position.  Transfers Overall transfer level: Needs assistance Equipment used: Rolling walker (2 wheeled) Transfers: Sit to/from [facility] Sit to Stand: Min assist;+2 safety/equipment Stand pivot transfers: Min assist;+2 safety/equipment       General transfer comment: Verbal cues for hand placement and technique.  Assist to rise to standing from bed and BSC.  Assist to steady.  Patient able to perform stand-pivot transfer BSC to chair with hand-hold assist.  Ambulation/Gait Ambulation/Gait assistance: Min assist;+2 safety/equipment Ambulation Distance (Feet): 10 Feet Assistive device: Rolling  walker (2 wheeled) Gait Pattern/deviations: Step-through pattern;Decreased stride length;Shuffle;Trunk flexed Gait velocity: Decreased Gait velocity interpretation: Below normal speed for age/gender General Gait Details: Verbal cues for safe use of RW.  Patient able to ambulate 10' with min assist.  Fatigued and needed to void.  BSC brought to patient.  Stairs            Wheelchair Mobility    Modified Rankin (Stroke Patients Only)       Balance Overall balance assessment: Needs assistance Sitting-balance support: No upper extremity supported;Feet supported Sitting balance-Leahy Scale: Fair     Standing balance support: Single extremity supported Standing balance-Leahy Scale: Poor                               Pertinent Vitals/Pain Pain Assessment: 0-10 Pain Score: 3  Pain Location: Head Pain Descriptors / Indicators: Headache;Dull Pain Intervention(s): Monitored during session    Home Living Family/patient expects to be discharged to:: Skilled nursing facility Living Arrangements: Spouse/significant other Available Help at Discharge: Family;Personal care attendant (Aide 7am-1pm; sister 1pm-4pm; husband until 5am) Type of Home: House Home Access: Ramped entrance     Home Layout: One level Home Equipment: Environmental consultant - 2 wheels;Walker - 4 wheels;Bedside commode;Wheelchair - manual;Hospital bed;Shower seat      Prior Function Level of Independence: Needs assistance   Gait / Transfers Assistance Needed: Uses RW for ambulation in house.  W/C for outside/long distances.  Mult falls  ADL's / Homemaking Assistance Needed: Caregiver assists with ADL's; daughter/husband prepare meals        Hand Dominance   Dominant Hand: Right    Extremity/Trunk Assessment   Upper Extremity Assessment:  Generalized weakness           Lower Extremity Assessment: Generalized weakness         Communication   Communication: No difficulties  Cognition  Arousal/Alertness: Awake/alert Behavior During Therapy: WFL for tasks assessed/performed Overall Cognitive Status: Within Functional Limits for tasks assessed                      General Comments      Exercises        Assessment/Plan    PT Assessment Patient needs continued PT services  PT Diagnosis Difficulty walking;Generalized weakness;Acute pain   PT Problem List Decreased strength;Decreased activity tolerance;Decreased balance;Decreased mobility;Decreased knowledge of use of DME;Cardiopulmonary status limiting activity;Pain  PT Treatment Interventions DME instruction;Gait training;Functional mobility training;Therapeutic activities;Patient/family education   PT Goals (Current goals can be found in the Care Plan section) Acute Rehab PT Goals Patient Stated Goal: To get stronger PT Goal Formulation: With patient/family Time For Goal Achievement: 03/04/15 Potential to Achieve Goals: Fair    Frequency Min 3X/week   Barriers to discharge Decreased caregiver support Patient alone 5am to 8am    Co-evaluation               End of Session Equipment Utilized During Treatment: Gait belt;Oxygen Activity Tolerance: Patient limited by fatigue Patient left: in chair;with call bell/phone within reach;with family/visitor present Nurse Communication: Mobility status (Patient in chair with family in room)         Time: 1209-1234 PT Time Calculation (min) (ACUTE ONLY): 25 min   Charges:   PT Evaluation $Initial PT Evaluation Tier I: 1 Procedure PT Treatments $Therapeutic Activity: 8-22 mins   PT G Codes:        Despina Pole Mar 25, 2015, 5:24 PM Carita Pian. Sanjuana Kava, [place] Pager [phone]

## 2015-02-25 NOTE — Op Note (Signed)
[facility], [address]   ENTEROSCOPY PROCEDURE REPORT     EXAM DATE: 02/25/2015  PATIENT NAME:      [name], [name]           MR #:      [id number]  BIRTHDATE:       1941-06-22      VISIT #:     [id number]  ATTENDING:     [name] Ada, MD     STATUS:     outpatient ASSISTANT:      William Dalton and Alinda Sierras MD: ASA CLASS:        Class III  INDICATIONS:  The patient is a 74 yr old female here for an enteroscopy procedure due to anemia and melena. PROCEDURE PERFORMED:     Small bowel enteroscopy with control of bleeding  MEDICATIONS:     Versed 5 mg IV and Fentanyl 75 mcg IV  CONSENT: The patient understands the risks and benefits of the procedure and understands that these risks include, but are not limited to: sedation, allergic reaction, infection, perforation and/or bleeding. Alternative means of evaluation and treatment include, among others: physical exam, x-rays, and/or surgical intervention. The patient elects to proceed with this endoscopic procedure.  DESCRIPTION OF PROCEDURE: During intra-op preparation period all mechanical & medical equipment was checked for proper function. Hand hygiene and appropriate measures for infection prevention was taken. After the risks, benefits and alternatives of the procedure were thoroughly explained, Informed consent was verified, confirmed and timeout was successfully executed by the treatment team. The    endoscope was introduced through the mouth and advanced to the proximal jejunum jejunum. The prep was The overall prep quality was excellent.. The instrument was then slowly withdrawn while examining the mucosa circumferentially. The scope was then completely withdrawn from the patient and the procedure terminated. The pulse, BP, and O2 saturation were monitored and documented by the physician and the nursing staff throughout the entire procedure.  The  patient was cared for as planned according to standard protocol, then discharged to recovery in stable condition and with appropriate post procedure care. Estimated blood loss is zero unless otherwise noted in this procedure report.    FINDINGS: In the distal esophagus, approximately 3-4 cm above the Z-line an isolated LA Grade D esophagitis was identified.  No evidence of any active bleeding, but it was friable.  No evidence of esophageal varices.  In the gastric lumen there was evidence of portal HTN gastropathy.  The fundus was negative for any varices. In the antrum a nonbleeding GAVE was again noted and it was treated with APC.  The pediatric colonoscope was then advanced to the proximal jejunum and a couple of atypical appearing AVMs were noted.  One area was ablated with APC, but the other was not able to be located again.  This was secondary to the patient's intolerance to sedation.  She was combative and vocalizing throughout the entire procedure.  At the junction of D1 and D2, two small typical appearing AVMs were found and ablated.    ADVERSE EVENTS:      There were no immediate complications.  IMPRESSIONS:     1) LA Grade D esophagitis. 2) Nonbleeding GAVE s/p APC. 3) Portal HTN Gastropathy. 4) Proximal small bowel AVMs s/p APC.  RECOMMENDATIONS:     1) Repeat enteroscopy in 4 weeks with anesthesia for further treatment of GAVE and any other AVMs. 2) PPI QD. 3)  Follow HGB and transfuse as necessary. RECALL:  _____________________________ [name] Ada, MD eSigned:  [name] Ada, MD 02/25/2015 11:17 AM   cc:     PATIENT NAME:  [name], [name] MR#: [id number]

## 2015-02-26 DIAGNOSIS — K210 Gastro-esophageal reflux disease with esophagitis: Secondary | ICD-10-CM

## 2015-02-26 DIAGNOSIS — E780 Pure hypercholesterolemia: Secondary | ICD-10-CM

## 2015-02-26 DIAGNOSIS — D61818 Other pancytopenia: Secondary | ICD-10-CM

## 2015-02-26 LAB — TYPE AND SCREEN
ABO/RH(D): O POS
Antibody Screen: NEGATIVE
DONOR AG TYPE: NEGATIVE
Donor AG Type: NEGATIVE
Unit division: 0
Unit division: 0

## 2015-02-26 LAB — GLUCOSE, CAPILLARY
GLUCOSE-CAPILLARY: 124 mg/dL — AB (ref 65–99)
GLUCOSE-CAPILLARY: 221 mg/dL — AB (ref 65–99)
Glucose-Capillary: 240 mg/dL — ABNORMAL HIGH (ref 65–99)
Glucose-Capillary: 191 mg/dL — ABNORMAL HIGH (ref 65–99)

## 2015-02-26 LAB — CBC
HEMATOCRIT: 26.5 % — AB (ref 36.0–46.0)
Hemoglobin: 8.7 g/dL — ABNORMAL LOW (ref 12.0–15.0)
MCH: 31.9 pg (ref 26.0–34.0)
MCHC: 32.8 g/dL (ref 30.0–36.0)
MCV: 97.1 fL (ref 78.0–100.0)
PLATELETS: 52 10*3/uL — AB (ref 150–400)
RBC: 2.73 MIL/uL — ABNORMAL LOW (ref 3.87–5.11)
RDW: 17.8 % — ABNORMAL HIGH (ref 11.5–15.5)
WBC: 3.4 10*3/uL — AB (ref 4.0–10.5)

## 2015-02-26 MED ORDER — SPIRONOLACTONE 12.5 MG HALF TABLET
12.5000 mg | ORAL_TABLET | Freq: Every day | ORAL | Status: DC
  Administered 2015-02-26 – 2015-02-28 (×3): 12.5 mg via ORAL
  Filled 2015-02-26 (×3): qty 1

## 2015-02-26 NOTE — Progress Notes (Signed)
Report called to Jonni Sanger, RN for pt transfer to 6 E13.

## 2015-02-26 NOTE — Progress Notes (Signed)
Subjective: No feeling well.  Hard time breathing, but this is a chronic issue.  Objective: Vital signs in last 24 hours: Temp:  [97.8 F (36.6 C)-98.7 F (37.1 C)] 98.6 F (37 C) (07/03 0308) Pulse Rate:  [54-63] 57 (07/03 0743) Resp:  [15-26] 26 (07/03 0743) BP: (93-144)/(13-84) 116/47 mmHg (07/03 0743) SpO2:  [94 %-100 %] 96 % (07/03 0743) Weight:  [102.3 kg (225 lb 8.5 oz)] 102.3 kg (225 lb 8.5 oz) (07/03 0307) Last BM Date: 02/25/15  Intake/Output from previous day: 07/02 0701 - 07/03 0700 In: 320 [P.O.:320] Out: 1850 [Urine:1850] Intake/Output this shift:    GEN: Alert, abdominal breathing. ABM: Obese, soft, NTND.  Lab Results:  Recent Labs  02/24/15 0813 02/25/15 0308 02/26/15 0230  WBC 2.5* 2.7* 3.4*  HGB 8.3* 8.7* 8.7*  HCT 25.1* 26.6* 26.5*  PLT 55* 62* 52*   BMET  Recent Labs  02/23/15 1556 02/24/15 0813  NA 137 144  K 4.1 4.2  CL 105 113*  CO2 24 25  GLUCOSE 131* 57*  BUN 103* 88*  CREATININE 1.89* 1.43*  CALCIUM 8.9 8.5*   LFT  Recent Labs  02/24/15 0813  PROT 5.0*  ALBUMIN 2.8*  AST 31  ALT 21  ALKPHOS 80  BILITOT 1.1   PT/INR  Recent Labs  02/23/15 2112  LABPROT 15.4*  INR 1.20   Hepatitis Panel No results for input(s): HEPBSAG, HCVAB, HEPAIGM, HEPBIGM in the last 72 hours. C-Diff No results for input(s): CDIFFTOX in the last 72 hours. Fecal Lactopherrin No results for input(s): FECLLACTOFRN in the last 72 hours.  Studies/Results: No results found.  Medications:  Scheduled: . antiseptic oral rinse  7 mL Mouth Rinse BID  . b complex vitamins  1 tablet Oral Daily  . buPROPion  150 mg Oral Daily  . desvenlafaxine  150 mg Oral Daily  . furosemide  40 mg Oral BID  . guaiFENesin  600 mg Oral BID  . insulin aspart  0-15 Units Subcutaneous TID WC  . insulin glargine  100 Units Subcutaneous BH-q7a  . loratadine  10 mg Oral Daily  . mometasone-formoterol  2 puff Inhalation BID  . multivitamin with minerals  1 tablet  Oral Daily  . nadolol  10 mg Oral Daily  . pantoprazole  40 mg Oral BID  . QUEtiapine  200 mg Oral QHS  . rifaximin  550 mg Oral Daily  . rOPINIRole  4 mg Oral QHS  . simvastatin  40 mg Oral QPM  . sodium chloride  3 mL Intravenous Q12H   Continuous:   Assessment/Plan: 1) Duodenal AVMs. 2) GAVE. 3) Isolated LA Grade D esophagitis. 4) COPD. 5) NASH.   The patient is stable at this time from the GI standpoint.  No change with her HGB.  She will require repeat treatment with Dr. Olevia Perches as an outpatient.  Plan: 1) Signing off. 2) Follow up with Dr. Olevia Perches in 2-4 weeks.   LOS: 3 days   Prestyn Mahn D 02/26/2015, 8:19 AM

## 2015-02-26 NOTE — Progress Notes (Signed)
[facility] PROGRESS NOTE  [name] QZR:[id number] DOB: 12-13-1940 DOA: 02/23/2015 PCP: Ria Bush, MD  Assessment/Plan: Symptomatic anemia: Hemoglobin dropped from 8.6-7.0 with low blood pressure and lightheadedness sensation. Patient reported multiple black stools at home. Has been on procrit weekly (not given over last 2 weeks as Hgb above 10)  - will transfer to telmetry - diet resumed by GI; so far well tolerated - continue PPI, transition to PO now - PRN antiemetics - Avoid NSAIDs and heparin products  - Maintain IV access (2 large bore IVs if possible). - Monitor closely her Hgb trend and transfuse for Hgb < 7  COPD with chronic resp failure: stable. -continue Xopenex nebulizer, Dulera, albuterol inhaler prn - mucinex - on 2-3 L nasal canula oxygen at baseline  -no wheezing  Pancytopenia: Likely secondary to NASH and CKD. -will follow blood count -currently at baseline  AoCKD-III: Baseline Cre is 1.4-1.7, her Cre is 1.89 on admission. Likely due to prerenal secondary to severe anemia and dehydration, and continuation of diruetics -Follow up renal function -Avoid ACEI and NSAIDs -lasix resumed -low sodium diet  NASH and cirrhosis -Will continue Lactulose and rifaximin -continue Nadolol (Variceal bleed prophylaxis).  Diastolic CHF: Echo: LVEF 35/45/62 60 - 65%. Mild LVH. Appears euvolemic and asymptomatic. -diuretics resumed on 7/2 after patient was feeling SOB -BNP 486.8 -continue low sodium diet, daily weights and strict intake and output -no SOB  DM-II: Last A1c 7.1, fairly well controled. Patient is taking lantus at home -will decrease Lantus dose from 140 to 100 units daily and use SSI; holding parameters discussed with nursing staff given NPO status -had episode of low CBG, secondary to NPO status. Will monitor and advance diet as soon as possible  Depression and anxiety: no suicidal or homicidal ideations. -Continue home  medications  Right leg pain including tenderness over calf area: -LE doppler demonstrated no acute DVT; but with concerns for cyst vs chronic short term thrombus in popliteal area -no warm sensation and no swelling -will wait for official reading   HLD: continue zocor  Deconditioning and generalized weakness: per PT rec's will benefit of SNF for rehab -SW consulted for placement  Code Status:Partial (no intubation) Family Communication: caregiver at bedside (Ms. [name]) Disposition Plan: remains inpatient and in stepdown for now   Consultants:  GI  Procedures:  See below for x-ray reports   EGD/enteroscopy: 7/2  Antibiotics:  None   HPI/Subjective: No nausea, no vomiting, denies CP. Tolerating diet. No episodes of overt bleeding. Reports breathing significantly improved/back to baseline after lasix resume on 7.2 evening. Hgb stable at 8.7  Objective: Filed Vitals:   02/26/15 0800  BP: 116/47  Pulse: 67  Temp: 98.6 F (37 C)  Resp: 22    Intake/Output Summary (Last 24 hours) at 02/26/15 1139 Last data filed at 02/26/15 1019  Gross per 24 hour  Intake    563 ml  Output   1850 ml  Net  -1287 ml   Filed Weights   02/24/15 2258 02/25/15 0500 02/26/15 0307  Weight: 100.608 kg (221 lb 12.8 oz) 101.9 kg (224 lb 10.4 oz) 102.3 kg (225 lb 8.5 oz)    Exam:   General: afebrile, denies CP. Experienced SOV yesterday, but resolved now after lasix given. No nausea or vomiting. No hematemesis. Hgb has remained stable at 8.7   Cardiovascular: positive murmur, no rubs, no gallops; rate controlled  Respiratory: good air movement, no wheezing or crackles on exam  Abdomen: soft, NT, ND, positive  BS  Musculoskeletal: no edema or cyanosis   Data Reviewed: Basic Metabolic Panel:  Recent Labs Lab 02/23/15 1556 02/24/15 0813  NA 137 144  K 4.1 4.2  CL 105 113*  CO2 24 25  GLUCOSE 131* 57*  BUN 103* 88*  CREATININE 1.89* 1.43*  CALCIUM 8.9 8.5*   Liver  Function Tests:  Recent Labs Lab 02/24/15 0813  AST 31  ALT 21  ALKPHOS 80  BILITOT 1.1  PROT 5.0*  ALBUMIN 2.8*   CBC:  Recent Labs Lab 02/23/15 1556 02/23/15 2112 02/24/15 0813 02/25/15 0308 02/26/15 0230  WBC 2.3* 2.0* 2.5* 2.7* 3.4*  NEUTROABS 1.6*  --   --   --   --   HGB 7.0* 6.8* 8.3* 8.7* 8.7*  HCT 21.2* 20.4* 25.1* 26.6* 26.5*  MCV 99.5 99.5 95.8 97.1 97.1  PLT 59* 56* 55* 62* 52*   BNP (last 3 results)  Recent Labs  01/25/15 0817 02/23/15 1556 02/24/15 0813  BNP 95.5 226.5* 486.8*    ProBNP (last 3 results)  Recent Labs  03/26/14 0259 08/11/14 1755  PROBNP 3352.0* 226.4*    CBG:  Recent Labs Lab 02/25/15 0752 02/25/15 1149 02/25/15 1742 02/25/15 2115 02/26/15 0808  GLUCAP 106* 96 189* 169* 124*    Studies: No results found.  Scheduled Meds: . antiseptic oral rinse  7 mL Mouth Rinse BID  . b complex vitamins  1 tablet Oral Daily  . buPROPion  150 mg Oral Daily  . desvenlafaxine  150 mg Oral Daily  . furosemide  40 mg Oral BID  . guaiFENesin  600 mg Oral BID  . insulin aspart  0-15 Units Subcutaneous TID WC  . insulin glargine  100 Units Subcutaneous BH-q7a  . loratadine  10 mg Oral Daily  . mometasone-formoterol  2 puff Inhalation BID  . multivitamin with minerals  1 tablet Oral Daily  . nadolol  10 mg Oral Daily  . pantoprazole  40 mg Oral BID  . QUEtiapine  200 mg Oral QHS  . rifaximin  550 mg Oral Daily  . rOPINIRole  4 mg Oral QHS  . simvastatin  40 mg Oral QPM  . sodium chloride  3 mL Intravenous Q12H   Continuous Infusions:    Principal Problem:   Symptomatic anemia Active Problems:   HYPERCHOLESTEROLEMIA   Iron deficiency anemia due to chronic blood loss   Severe recurrent major depressive disorder with psychotic features   RESTLESS LEGS SYNDROME   Essential hypertension   Coronary atherosclerosis   COPD (chronic obstructive pulmonary disease) with chronic bronchitis   GERD   Autoimmune hepatitis    Osteoporosis   Obesity, Class II, BMI 35-39.9, with comorbidity   GAVE (gastric antral vascular ectasia)   Other pancytopenia   Chronic diastolic CHF (congestive heart failure)   Acute renal failure superimposed on stage 3 chronic kidney disease   Hepatic cirrhosis   Chronic respiratory failure   Bradycardia   Diabetes mellitus type 2 with complications, uncontrolled   Anemia    Time spent: 40 minutes (> 50 % of time dedicated to face to face examination, update to family members/significant others at bedside and coordination of care)    [place] Pager [phone]. If 7PM-7AM, please contact night-coverage at [email or web address], password [facility] 02/26/2015, 11:39 AM  LOS: 3 days

## 2015-02-27 DIAGNOSIS — K754 Autoimmune hepatitis: Secondary | ICD-10-CM

## 2015-02-27 LAB — CBC
HCT: 25.4 % — ABNORMAL LOW (ref 36.0–46.0)
Hemoglobin: 8.4 g/dL — ABNORMAL LOW (ref 12.0–15.0)
MCH: 31.8 pg (ref 26.0–34.0)
MCHC: 33.1 g/dL (ref 30.0–36.0)
MCV: 96.2 fL (ref 78.0–100.0)
PLATELETS: 54 10*3/uL — AB (ref 150–400)
RBC: 2.64 MIL/uL — ABNORMAL LOW (ref 3.87–5.11)
RDW: 17.5 % — ABNORMAL HIGH (ref 11.5–15.5)
WBC: 3 10*3/uL — ABNORMAL LOW (ref 4.0–10.5)

## 2015-02-27 LAB — BASIC METABOLIC PANEL
Anion gap: 6 (ref 5–15)
BUN: 30 mg/dL — ABNORMAL HIGH (ref 6–20)
CHLORIDE: 107 mmol/L (ref 101–111)
CO2: 26 mmol/L (ref 22–32)
Calcium: 8.8 mg/dL — ABNORMAL LOW (ref 8.9–10.3)
Creatinine, Ser: 1.27 mg/dL — ABNORMAL HIGH (ref 0.44–1.00)
GFR, EST AFRICAN AMERICAN: 48 mL/min — AB (ref >60)
GFR, EST NON AFRICAN AMERICAN: 41 mL/min — AB (ref >60)
GLUCOSE: 172 mg/dL — AB (ref 65–99)
Potassium: 4.2 mmol/L (ref 3.5–5.1)
Sodium: 139 mmol/L (ref 135–145)

## 2015-02-27 LAB — GLUCOSE, CAPILLARY
GLUCOSE-CAPILLARY: 154 mg/dL — AB (ref 65–99)
GLUCOSE-CAPILLARY: 230 mg/dL — AB (ref 65–99)
GLUCOSE-CAPILLARY: 264 mg/dL — AB (ref 65–99)
Glucose-Capillary: 163 mg/dL — ABNORMAL HIGH (ref 65–99)

## 2015-02-27 MED ORDER — RIFAXIMIN 550 MG PO TABS
550.0000 mg | ORAL_TABLET | Freq: Two times a day (BID) | ORAL | Status: DC
  Administered 2015-02-27 – 2015-02-28 (×2): 550 mg via ORAL
  Filled 2015-02-27 (×4): qty 1

## 2015-02-27 NOTE — Progress Notes (Signed)
Physical Therapy Treatment Patient Details Name: [name] MRN: [id number] DOB: 23-Jan-1941 Today's Date: 02/27/2015    History of Present Illness Patient is a 74 yo female admitted 02/23/15 with weakness and hypotension.  Patient with symptomatic anemia.  PMH:  Recently hospitalized > SNF > home day prior to this admisison; COPD, HTN, CAD, CHF, DM, asthma, RLS, depression with psychotic behavior, NASH, anemia    PT Comments    Pt progressing with activity tolerance and gait distance; continues to be deconditioned, requested "rescue inhaler" after amb and RN administered; will continue to follow  Follow Up Recommendations  SNF (HHPT if not agreeable)     Equipment Recommendations  None recommended by PT    Recommendations for Other Services       Precautions / Restrictions Precautions Precautions: Fall Precaution Comments: h/o frequent falls Restrictions Weight Bearing Restrictions: No    Mobility  Bed Mobility Overal bed mobility: Needs Assistance Bed Mobility: Supine to Sit;Sit to Supine     Supine to sit: Supervision Sit to supine: Supervision   General bed mobility comments: used rails, HOB elevated  Transfers Overall transfer level: Needs assistance Equipment used: Rolling walker (2 wheeled) Transfers: Sit to/from Stand Sit to Stand: Min guard         General transfer comment: verbal cues for hand placement; pt prefers to pull up on walker   Ambulation/Gait Ambulation/Gait assistance: Min guard;Min assist Ambulation Distance (Feet): 40 Feet Assistive device: Rolling walker (2 wheeled) Gait Pattern/deviations: Step-through pattern;Decreased stride length;Trunk flexed     General Gait Details: verbal cues for RW safety and breathing; brief standing rest d/t DOE   Stairs            Wheelchair Mobility    Modified Rankin (Stroke Patients Only)       Balance   Sitting-balance support: No upper extremity supported;Feet supported Sitting  balance-Leahy Scale: Good     Standing balance support: During functional activity;Bilateral upper extremity supported Standing balance-Leahy Scale: Fair                      Cognition Arousal/Alertness: Awake/alert Behavior During Therapy: WFL for tasks assessed/performed Overall Cognitive Status: Within Functional Limits for tasks assessed                      Exercises      General Comments        Pertinent Vitals/Pain Pain Assessment: No/denies pain Pain Score: 3  Pain Location: R shoulder and leg Pain Descriptors / Indicators: Sore;Aching Pain Intervention(s): Monitored during session;Repositioned    Home Living Family/patient expects to be discharged to:: Skilled nursing facility Living Arrangements: Spouse/significant other Available Help at Discharge: Family;Personal care attendant Type of Home: House Home Access: Ramped entrance   Home Layout: One level Home Equipment: Environmental consultant - 2 wheels;Walker - 4 wheels;Bedside commode;Wheelchair - manual;Hospital bed;Shower seat Additional Comments: On 3L O2 at home    Prior Function Level of Independence: Needs assistance  Gait / Transfers Assistance Needed: Uses RW for ambulation in house.  W/C for outside/long distances.  Mult falls   Comments: pt reporst that husband still works and caregivers are with her most of the time while he is at work.   PT Goals (current goals can now be found in the care plan section) Acute Rehab PT Goals Patient Stated Goal: To get stronger PT Goal Formulation: With patient/family Time For Goal Achievement: 03/04/15 Potential to Achieve Goals: Fair Progress towards PT  goals: Progressing toward goals    Frequency  Min 3X/week    PT Plan Current plan remains appropriate    Co-evaluation             End of Session Equipment Utilized During Treatment: Gait belt;Oxygen Activity Tolerance: Patient limited by fatigue Patient left: in bed;with call bell/phone within  reach;with nursing/sitter in room;with family/visitor present     Time: 1349-1401 PT Time Calculation (min) (ACUTE ONLY): 12 min  Charges:  $Gait Training: 8-22 mins                    G Codes:      Kirandeep Fariss 25-Mar-2015, 3:09 PM

## 2015-02-27 NOTE — Clinical Social Work Placement (Signed)
   CLINICAL SOCIAL WORK PLACEMENT  NOTE  Date:  02/27/2015  Patient Details  Name: [name] MRN: [id number] Date of Birth: 03-Jun-1941  Clinical Social Work is seeking post-discharge placement for this patient at the [place] level of care (*CSW will initial, date and re-position this form in  chart as items are completed):  Yes   Patient/family provided with [facility] Work Department's list of facilities offering this level of care within the geographic area requested by the patient (or if unable, by the patient's family).  Yes   Patient/family informed of their freedom to choose among providers that offer the needed level of care, that participate in Medicare, Medicaid or managed care program needed by the patient, have an available bed and are willing to accept the patient.  Yes   Patient/family informed of [facility]'s ownership interest in [facility] and [facility], as well as of the fact that they are under no obligation to receive care at these facilities.  PASRR submitted to EDS on       PASRR number received on       Existing PASRR number confirmed on       FL2 transmitted to all facilities in geographic area requested by pt/family on 02/27/15     FL2 transmitted to all facilities within larger geographic area on       Patient informed that his/her managed care company has contracts with or will negotiate with certain facilities, including the following:            Patient/family informed of bed offers received.  Patient chooses bed at       Physician recommends and patient chooses bed at      Patient to be transferred to   on  .  Patient to be transferred to facility by       Patient family notified on   of transfer.  Name of family member notified:        PHYSICIAN       Additional Comment:    _______________________________________________ Sable Feil, LCSW 02/27/2015, 6:27 PM

## 2015-02-27 NOTE — Evaluation (Signed)
Occupational Therapy Evaluation Patient Details Name: [name] MRN: [id number] DOB: 08-02-1941 Today's Date: 02/27/2015    History of Present Illness Patient is a 74 yo female admitted 02/23/15 with weakness and hypotension.  Patient with symptomatic anemia.  PMH:  Recently hospitalized > SNF > home day prior to this admisison; COPD, HTN, CAD, CHF, DM, asthma, RLS, depression with psychotic behavior, NASH, anemia   Clinical Impression   Pt with decline in function and safety with ADLs and ADL mobility with decreased strength, balance and endurance. Pt would benefit form acute OT services to address impairments to increase level of function an safety    Follow Up Recommendations  SNF;Supervision/Assistance - 24 hour    Equipment Recommendations  None recommended by OT    Recommendations for Other Services       Precautions / Restrictions Precautions Precautions: Fall Precaution Comments: h/o frequent falls Restrictions Weight Bearing Restrictions: No      Mobility Bed Mobility Overal bed mobility: Needs Assistance Bed Mobility: Supine to Sit;Sit to Supine     Supine to sit: Min guard Sit to supine: Min guard   General bed mobility comments: used rails  Transfers Overall transfer level: Needs assistance Equipment used: Rolling walker (2 wheeled) Transfers: Sit to/from Stand Sit to Stand: Min assist;+2 safety/equipment         General transfer comment: Verbal cues for hand placement and technique.  Assist to rise to standing from bed and BSC.  Assist to steady.      Balance   Sitting-balance support: No upper extremity supported;Feet supported Sitting balance-Leahy Scale: Good     Standing balance support: Single extremity supported;Bilateral upper extremity supported;During functional activity Standing balance-Leahy Scale: Fair                              ADL Overall ADL's : Needs assistance/impaired     Grooming: Wash/dry  hands;Wash/dry face;Standing;Min guard   Upper Body Bathing: Supervision/ safety;Set up;Sitting   Lower Body Bathing: Moderate assistance   Upper Body Dressing : Supervision/safety;Set up;Sitting   Lower Body Dressing: Moderate assistance   Toilet Transfer: Minimal assistance;BSC;RW;Cueing for safety   Toileting- Clothing Manipulation and Hygiene: Minimal assistance;Sit to/from stand       Functional mobility during ADLs: Minimal assistance;Cueing for safety       Vision  reading glasses, no change from baseline   Perception Perception Perception Tested?: No   Praxis Praxis Praxis tested?: Not tested    Pertinent Vitals/Pain Pain Assessment: 0-10 Pain Score: 3  Pain Location: R shoulder and leg Pain Descriptors / Indicators: Sore;Aching Pain Intervention(s): Monitored during session;Repositioned     Hand Dominance Right   Extremity/Trunk Assessment Upper Extremity Assessment Upper Extremity Assessment: Generalized weakness   Lower Extremity Assessment Lower Extremity Assessment: Defer to PT evaluation   Cervical / Trunk Assessment Cervical / Trunk Assessment: Normal   Communication Communication Communication: No difficulties   Cognition Arousal/Alertness: Awake/alert Behavior During Therapy: WFL for tasks assessed/performed Overall Cognitive Status: Within Functional Limits for tasks assessed                     General Comments   pt very pleasant and cooperative                 Home Living Family/patient expects to be discharged to:: Skilled nursing facility Living Arrangements: Spouse/significant other Available Help at Discharge: Family;Personal care attendant Type of Home: [place]  Access: Ramped entrance     Home Layout: One level               Home Equipment: Walker - 2 wheels;Walker - 4 wheels;Bedside commode;Wheelchair - manual;Hospital bed;Shower seat   Additional Comments: On 3L O2 at home      Prior  Functioning/Environment Level of Independence: Needs assistance  Gait / Transfers Assistance Needed: Uses RW for ambulation in house.  W/C for outside/long distances.  Mult falls     Comments: pt reporst that husband still works and caregivers are with her most of the time while he is at work.    OT Diagnosis: Generalized weakness;Acute pain   OT Problem List: Decreased strength;Decreased activity tolerance;Pain;Impaired balance (sitting and/or standing)   OT Treatment/Interventions: Self-care/ADL training;Therapeutic exercise;Patient/family education;Therapeutic activities;Balance training    OT Goals(Current goals can be found in the care plan section) Acute Rehab OT Goals Patient Stated Goal: To get stronger OT Goal Formulation: With patient Time For Goal Achievement: 03/06/15 Potential to Achieve Goals: Good ADL Goals Pt Will Perform Grooming: with set-up;with supervision;standing Pt Will Perform Lower Body Bathing: with min assist;with min guard assist;sit to/from stand;sitting/lateral leans Pt Will Perform Lower Body Dressing: with min guard assist;with min assist;sit to/from stand;sitting/lateral leans Pt Will Transfer to Toilet: with min guard assist;regular height toilet;bedside commode;grab bars Pt Will Perform Toileting - Clothing Manipulation and hygiene: with min guard assist;with supervision;sit to/from stand  OT Frequency: Min 2X/week   Barriers to D/C:    planning for d/c to SNF for rehab                     End of Session Equipment Utilized During Treatment: Rolling walker;Other (comment) (BSC)  Activity Tolerance: Patient tolerated treatment well;Patient limited by fatigue Patient left: in bed;with call bell/phone within reach;with family/visitor present   Time: 2202-5427 OT Time Calculation (min): 24 min Charges:  OT General Charges $OT Visit: 1 Procedure OT Evaluation $Initial OT Evaluation Tier I: 1 Procedure OT Treatments $Therapeutic Activity:  8-22 mins G-Codes:    Britt Bottom 02/27/2015, 12:25 PM

## 2015-02-27 NOTE — Progress Notes (Signed)
[facility] PROGRESS NOTE  [name] YIR:[id number] DOB: May 30, 1941 DOA: 02/23/2015 PCP: Ria Bush, MD  Assessment/Plan: Symptomatic anemia: Hemoglobin dropped from 8.6-7.0 with low blood pressure and lightheadedness sensation. Patient reported multiple black stools at home. Has been on procrit weekly (not given over last 2 weeks as Hgb above 10)  - will monitor Hgb trend - diet resumed by GI; so far well tolerated - continue PPI, transition to PO now - PRN antiemetics - Avoid NSAIDs and heparin products  - Monitor closely her Hgb trend and transfuse for Hgb < 7  COPD with chronic resp failure: stable. -continue Xopenex nebulizer, Dulera, albuterol inhaler prn - mucinex - on 2-3 L nasal canula oxygen at baseline  -no wheezing  Pancytopenia: Likely secondary to NASH and CKD. -will follow blood count -currently at baseline  AoCKD-III: Baseline Cre is 1.4-1.7, her Cre is 1.89 on admission. Likely due to prerenal secondary to severe anemia and dehydration, and continuation of diruetics -Follow up renal function -Avoid ACEI and NSAIDs -lasix resumed -low sodium diet  NASH and cirrhosis -Will continue Lactulose and rifaximin -continue Nadolol (Variceal bleed prophylaxis).  Diastolic CHF: Echo: LVEF 50/09/38 60 - 65%. Mild LVH. Appears euvolemic and asymptomatic. -diuretics resumed on 7/2 after patient was feeling SOB -BNP 486.8 -continue low sodium diet, daily weights and strict intake and output -no SOB  DM-II: Last A1c 7.1, fairly well controled. Patient is taking lantus at home -will decrease Lantus dose from 140 to 100 units daily and use SSI; holding parameters discussed with nursing staff given NPO status -had episode of low CBG, secondary to NPO status. Will monitor and advance diet as soon as possible  Depression and anxiety: no suicidal or homicidal ideations. -Continue home medications  Right leg pain including tenderness over calf area: -LE  doppler demonstrated no acute DVT; but with concerns for cyst vs chronic short term thrombus in popliteal area -no warm sensation and no swelling -will wait for official reading   HLD: continue zocor  Deconditioning and generalized weakness: per PT rec's will benefit of SNF for rehab -SW consulted for placement  Code Status:Partial (no intubation) Family Communication: caregiver at bedside (Ms. [name]) Disposition Plan: remains inpatient while waiting on bed at SNF   Consultants:  GI  Procedures:  See below for x-ray reports   EGD/enteroscopy: 7/2  Antibiotics:  None   HPI/Subjective: No nausea, no vomiting, denies CP. Tolerating diet. No episodes of overt bleeding. Hgb 8.4 and no SOB  Objective: Filed Vitals:   02/27/15 0830  BP: 114/53  Pulse: 52  Temp: 98.5 F (36.9 C)  Resp: 19    Intake/Output Summary (Last 24 hours) at 02/27/15 1646 Last data filed at 02/27/15 0831  Gross per 24 hour  Intake    480 ml  Output      0 ml  Net    480 ml   Filed Weights   02/25/15 0500 02/26/15 0307 02/26/15 2105  Weight: 101.9 kg (224 lb 10.4 oz) 102.3 kg (225 lb 8.5 oz) 102.6 kg (226 lb 3.1 oz)    Exam:   General: afebrile, denies CP. Reports breathing is much better. No nausea or vomiting. No hematemesis. Hgb has remained stable at 8.4  Cardiovascular: positive murmur, no rubs, no gallops; rate controlled  Respiratory: good air movement, no wheezing or crackles on exam  Abdomen: soft, NT, ND, positive BS  Musculoskeletal: no edema or cyanosis   Data Reviewed: Basic Metabolic Panel:  Recent Labs Lab 02/23/15 1556  02/24/15 0813 02/27/15 0430  NA 137 144 139  K 4.1 4.2 4.2  CL 105 113* 107  CO2 24 25 26   GLUCOSE 131* 57* 172*  BUN 103* 88* 30*  CREATININE 1.89* 1.43* 1.27*  CALCIUM 8.9 8.5* 8.8*   Liver Function Tests:  Recent Labs Lab 02/24/15 0813  AST 31  ALT 21  ALKPHOS 80  BILITOT 1.1  PROT 5.0*  ALBUMIN 2.8*   CBC:  Recent  Labs Lab 02/23/15 1556 02/23/15 2112 02/24/15 0813 02/25/15 0308 02/26/15 0230 02/27/15 0430  WBC 2.3* 2.0* 2.5* 2.7* 3.4* 3.0*  NEUTROABS 1.6*  --   --   --   --   --   HGB 7.0* 6.8* 8.3* 8.7* 8.7* 8.4*  HCT 21.2* 20.4* 25.1* 26.6* 26.5* 25.4*  MCV 99.5 99.5 95.8 97.1 97.1 96.2  PLT 59* 56* 55* 62* 52* 54*   BNP (last 3 results)  Recent Labs  01/25/15 0817 02/23/15 1556 02/24/15 0813  BNP 95.5 226.5* 486.8*    ProBNP (last 3 results)  Recent Labs  03/26/14 0259 08/11/14 1755  PROBNP 3352.0* 226.4*    CBG:  Recent Labs Lab 02/26/15 1207 02/26/15 1700 02/26/15 2133 02/27/15 0737 02/27/15 1127  GLUCAP 240* 191* 221* 154* 230*    Studies: No results found.  Scheduled Meds: . antiseptic oral rinse  7 mL Mouth Rinse BID  . b complex vitamins  1 tablet Oral Daily  . buPROPion  150 mg Oral Daily  . desvenlafaxine  150 mg Oral Daily  . furosemide  40 mg Oral BID  . guaiFENesin  600 mg Oral BID  . insulin aspart  0-15 Units Subcutaneous TID WC  . insulin glargine  100 Units Subcutaneous BH-q7a  . loratadine  10 mg Oral Daily  . mometasone-formoterol  2 puff Inhalation BID  . multivitamin with minerals  1 tablet Oral Daily  . nadolol  10 mg Oral Daily  . pantoprazole  40 mg Oral BID  . QUEtiapine  200 mg Oral QHS  . rifaximin  550 mg Oral BID  . rOPINIRole  4 mg Oral QHS  . simvastatin  40 mg Oral QPM  . sodium chloride  3 mL Intravenous Q12H  . spironolactone  12.5 mg Oral Daily   Continuous Infusions:    Principal Problem:   Symptomatic anemia Active Problems:   HYPERCHOLESTEROLEMIA   Iron deficiency anemia due to chronic blood loss   Severe recurrent major depressive disorder with psychotic features   RESTLESS LEGS SYNDROME   Essential hypertension   Coronary atherosclerosis   COPD (chronic obstructive pulmonary disease) with chronic bronchitis   GERD   Autoimmune hepatitis   Osteoporosis   Obesity, Class II, BMI 35-39.9, with  comorbidity   GAVE (gastric antral vascular ectasia)   Other pancytopenia   Chronic diastolic CHF (congestive heart failure)   Acute renal failure superimposed on stage 3 chronic kidney disease   Hepatic cirrhosis   Chronic respiratory failure   Bradycardia   Diabetes mellitus type 2 with complications, uncontrolled   Anemia    Time spent: 30 minutes    Barton Dubois  [facility] Pager [phone]. If 7PM-7AM, please contact night-coverage at [email or web address], password [facility] 02/27/2015, 4:46 PM  LOS: 4 days

## 2015-02-27 NOTE — Care Management Note (Signed)
Case Management Note  Patient Details  Name: [name] MRN: [id number] Date of Birth: 21-Dec-1940  Subjective/Objective:                    Action/Plan:   Expected Discharge Date:                  Expected Discharge Plan:  [place]  In-House Referral:     Discharge planning Services  CM Consult  Post Acute Care Choice:    Choice offered to:     DME Arranged:    DME Agency:     HH Arranged:    [place] Agency:  [facility]  Status of Service:  Completed, signed off  Medicare Important Message Given:   yes Date Medicare IM Given:   02/27/15 Medicare IM give by:   Norina Buzzard, RN, BSN Date Additional Medicare IM Given:    Additional Medicare Important Message give by:     If discussed at [place] of Stay Meetings, dates discussed:    Additional Comments:  Norina Buzzard, RN 02/27/2015, 12:10 PM

## 2015-02-27 NOTE — Clinical Social Work Note (Signed)
CSW consult received regarding skilled facility placement. Patient assessed (full assessment to follow) and faxed out.   Jakeira Seeman Givens, MSW, LCSW Licensed Clinical Social Worker [place] [phone]

## 2015-02-27 NOTE — Progress Notes (Signed)
Patient refusing bed alarm. Patient educated about fall prevention safety plan. Side rails and grip socks in place. Demonstrated to utilize call bell light to call for assistance. Patient acknowledges and verbalizes understanding. Staff will continue to monitor.

## 2015-02-28 ENCOUNTER — Ambulatory Visit: Payer: Medicare Other | Admitting: Family Medicine

## 2015-02-28 ENCOUNTER — Telehealth: Payer: Self-pay | Admitting: *Deleted

## 2015-02-28 ENCOUNTER — Encounter (HOSPITAL_COMMUNITY): Payer: Self-pay | Admitting: Gastroenterology

## 2015-02-28 DIAGNOSIS — J9611 Chronic respiratory failure with hypoxia: Secondary | ICD-10-CM | POA: Diagnosis not present

## 2015-02-28 DIAGNOSIS — I10 Essential (primary) hypertension: Secondary | ICD-10-CM | POA: Diagnosis not present

## 2015-02-28 DIAGNOSIS — K31819 Angiodysplasia of stomach and duodenum without bleeding: Secondary | ICD-10-CM | POA: Diagnosis not present

## 2015-02-28 DIAGNOSIS — D638 Anemia in other chronic diseases classified elsewhere: Secondary | ICD-10-CM | POA: Diagnosis not present

## 2015-02-28 DIAGNOSIS — M6281 Muscle weakness (generalized): Secondary | ICD-10-CM | POA: Diagnosis not present

## 2015-02-28 DIAGNOSIS — N179 Acute kidney failure, unspecified: Secondary | ICD-10-CM | POA: Diagnosis not present

## 2015-02-28 DIAGNOSIS — R278 Other lack of coordination: Secondary | ICD-10-CM | POA: Diagnosis not present

## 2015-02-28 DIAGNOSIS — E118 Type 2 diabetes mellitus with unspecified complications: Secondary | ICD-10-CM | POA: Diagnosis not present

## 2015-02-28 DIAGNOSIS — I509 Heart failure, unspecified: Secondary | ICD-10-CM | POA: Diagnosis not present

## 2015-02-28 DIAGNOSIS — K7469 Other cirrhosis of liver: Secondary | ICD-10-CM | POA: Diagnosis not present

## 2015-02-28 DIAGNOSIS — Q2733 Arteriovenous malformation of digestive system vessel: Secondary | ICD-10-CM | POA: Diagnosis not present

## 2015-02-28 DIAGNOSIS — D649 Anemia, unspecified: Secondary | ICD-10-CM | POA: Diagnosis not present

## 2015-02-28 DIAGNOSIS — E119 Type 2 diabetes mellitus without complications: Secondary | ICD-10-CM | POA: Diagnosis not present

## 2015-02-28 DIAGNOSIS — D500 Iron deficiency anemia secondary to blood loss (chronic): Secondary | ICD-10-CM | POA: Diagnosis not present

## 2015-02-28 DIAGNOSIS — N289 Disorder of kidney and ureter, unspecified: Secondary | ICD-10-CM | POA: Diagnosis not present

## 2015-02-28 DIAGNOSIS — K7460 Unspecified cirrhosis of liver: Secondary | ICD-10-CM | POA: Diagnosis not present

## 2015-02-28 DIAGNOSIS — I5032 Chronic diastolic (congestive) heart failure: Secondary | ICD-10-CM | POA: Diagnosis not present

## 2015-02-28 DIAGNOSIS — Z9181 History of falling: Secondary | ICD-10-CM | POA: Diagnosis not present

## 2015-02-28 DIAGNOSIS — R5381 Other malaise: Secondary | ICD-10-CM

## 2015-02-28 DIAGNOSIS — J449 Chronic obstructive pulmonary disease, unspecified: Secondary | ICD-10-CM | POA: Diagnosis not present

## 2015-02-28 DIAGNOSIS — J9610 Chronic respiratory failure, unspecified whether with hypoxia or hypercapnia: Secondary | ICD-10-CM | POA: Diagnosis not present

## 2015-02-28 LAB — CBC
HCT: 25.7 % — ABNORMAL LOW (ref 36.0–46.0)
HEMOGLOBIN: 8.4 g/dL — AB (ref 12.0–15.0)
MCH: 31.8 pg (ref 26.0–34.0)
MCHC: 32.7 g/dL (ref 30.0–36.0)
MCV: 97.3 fL (ref 78.0–100.0)
PLATELETS: 56 10*3/uL — AB (ref 150–400)
RBC: 2.64 MIL/uL — AB (ref 3.87–5.11)
RDW: 17.2 % — ABNORMAL HIGH (ref 11.5–15.5)
WBC: 2.6 10*3/uL — ABNORMAL LOW (ref 4.0–10.5)

## 2015-02-28 LAB — GLUCOSE, CAPILLARY
Glucose-Capillary: 138 mg/dL — ABNORMAL HIGH (ref 65–99)
Glucose-Capillary: 165 mg/dL — ABNORMAL HIGH (ref 65–99)

## 2015-02-28 MED ORDER — RIFAXIMIN 550 MG PO TABS: 550.0000 mg | ORAL_TABLET | Freq: Two times a day (BID) | ORAL | Status: DC

## 2015-02-28 MED ORDER — FUROSEMIDE 40 MG PO TABS: 40.0000 mg | ORAL_TABLET | Freq: Two times a day (BID) | ORAL | Status: DC

## 2015-02-28 MED ORDER — ALPRAZOLAM 1 MG PO TABS: 1.0000 mg | ORAL_TABLET | Freq: Three times a day (TID) | ORAL | Status: DC | PRN

## 2015-02-28 MED ORDER — OXYCODONE HCL 5 MG PO TABS: 5.0000 mg | ORAL_TABLET | Freq: Four times a day (QID) | ORAL | Status: DC | PRN

## 2015-02-28 NOTE — Clinical Social Work Placement (Signed)
   CLINICAL SOCIAL WORK PLACEMENT  NOTE  Date:  02/28/2015  Patient Details  Name: [name] MRN: [id number] Date of Birth: [birth year]  Clinical Social Work is seeking post-discharge placement for this patient at the [place] level of care (*CSW will initial, date and re-position this form in  chart as items are completed):  Yes   Patient/family provided with [place] Work Department's list of facilities offering this level of care within the geographic area requested by the patient (or if unable, by the patient's family).  Yes   Patient/family informed of their freedom to choose among providers that offer the needed level of care, that participate in Medicare, Medicaid or managed care program needed by the patient, have an available bed and are willing to accept the patient.  Yes   Patient/family informed of [place]'s ownership interest in [facility] and [facility], as well as of the fact that they are under no obligation to receive care at these facilities.  PASRR submitted to EDS on       PASRR number received on       Existing PASRR number confirmed on  02/27/15  FL2 transmitted to all facilities in geographic area requested by pt/family on 02/27/15     FL2 transmitted to all facilities within larger geographic area on       Patient informed that his/her managed care company has contracts with or will negotiate with certain facilities, including the following:         02/28/15 - Patient/family informed of bed offers received.  Patient chooses bed at  [facility]     Physician recommends and patient chooses bed at      Patient to be transferred to  [facility] on  02/28/15.  Patient to be transferred to facility by  family    Patient family notified on  02/28/15 of transfer.  Name of family member notified:   Abigale Dorow - spouse     PHYSICIAN       Additional Comment:     _______________________________________________ Sable Feil, LCSW 02/28/2015, 4:28 PM

## 2015-02-28 NOTE — Clinical Social Work Placement (Signed)
   CLINICAL SOCIAL WORK PLACEMENT  NOTE  Date:  02/28/2015  Patient Details  Name: [name] MRN: [id number] Date of Birth: [birth year]  Clinical Social Work is seeking post-discharge placement for this patient at the [place] level of care (*CSW will initial, date and re-position this form in  chart as items are completed):  Yes   Patient/family provided with [place] Work Department's list of facilities offering this level of care within the geographic area requested by the patient (or if unable, by the patient's family).  Yes   Patient/family informed of their freedom to choose among providers that offer the needed level of care, that participate in Medicare, Medicaid or managed care program needed by the patient, have an available bed and are willing to accept the patient.  Yes   Patient/family informed of [place]'s ownership interest in [facility] and [facility], as well as of the fact that they are under no obligation to receive care at these facilities.  PASRR submitted to EDS on       PASRR number received on       Existing PASRR number confirmed on  02/27/15     FL2 transmitted to all facilities in geographic area requested by pt/family on 02/27/15     FL2 transmitted to all facilities within larger geographic area on       Patient informed that his/her managed care company has contracts with or will negotiate with certain facilities, including the following:         02/28/15 - Patient/family informed of bed offers received.  Patient chooses bed at  [facility]     Physician recommends and patient chooses bed at      Patient to be transferred to  [facility] on  02/28/15.  Patient to be transferred to facility by  ambulance     Patient family notified on  02/28/15 of transfer.  Name of family member notified:   Spouse, [name]  PHYSICIAN       Additional Comment:     _______________________________________________ Sable Feil, LCSW 02/28/2015, 3:02 PM

## 2015-02-28 NOTE — Progress Notes (Signed)
PT Cancellation Note  Patient Details Name: [name] MRN: [id number] DOB: [birth year]   Cancelled Treatment:    Reason Eval/Treat Not Completed: Patient declined, no reason specified.  Pt headed to SNF and discharge orders are in.  Will check on her tomorrow if she remains overnight.   Ramond Dial 02/28/2015, 1:46 PM   Mee Hives, PT MS Acute Rehab Dept. Number: [facility] [id number] and [place] [phone]

## 2015-02-28 NOTE — Discharge Summary (Signed)
Physician Discharge Summary  [name] EXB:[id number] DOB: [birth year] DOA: 02/23/2015  PCP: Ria Bush, MD  Admit date: 02/23/2015 Discharge date: 02/28/2015  Time spent: >30 minutes  Recommendations for Outpatient Follow-up:  Follow low sodium diet Check weight on daily basis Repeat CBC in 5 days to follow Hgb trend Maintain adequate hydration  Follow up with PCP in 2 weeks Will need outpatient follow up with Dr. Olevia Perches in 4 weeks for repeat enteroscopy and further treatment for GAVE  Discharge Diagnoses:  Principal Problem:   Symptomatic anemia Active Problems:   HYPERCHOLESTEROLEMIA   Iron deficiency anemia due to chronic blood loss   Severe recurrent major depressive disorder with psychotic features   RESTLESS LEGS SYNDROME   Essential hypertension   Coronary atherosclerosis   COPD (chronic obstructive pulmonary disease) with chronic bronchitis   GERD   Autoimmune hepatitis   Osteoporosis   Obesity, Class II, BMI 35-39.9, with comorbidity   GAVE (gastric antral vascular ectasia)   Other pancytopenia   Chronic diastolic CHF (congestive heart failure)   Acute renal failure superimposed on stage 3 chronic kidney disease   Hepatic cirrhosis   Chronic respiratory failure   Bradycardia   Diabetes mellitus type 2 with complications, uncontrolled   Anemia   Discharge Condition: stable and improved. Will be discharge to [facility] for [facility] rehabilitation and will follow with PCP and with GI as an outpatient  Diet recommendation: heart healthy diet and modified carbohydrates  Filed Weights   02/26/15 0307 02/26/15 2105 02/27/15 2121  Weight: 102.3 kg (225 lb 8.5 oz) 102.6 kg (226 lb 3.1 oz) 102.195 kg (225 lb 4.8 oz)    History of present illness:  74 y.o. female with PMH of GAVE (gastric antral vascular actasia), [place], liver cirrhosis, diastolic heart failure, portal hypertension, COPD on 3 L of oxygen chronically at home, aortic stenosis, CCKD-III, who presents  with generalized weakness and lightheadedness. Patient was recently hospitalized from 6/16 due to COPD exacerbation.  In ED, patient was found to have hemoglobin dropped from 8.6 on 01/28/15 to 7.0, pancytopenia, FOBT negative, bradycardia, soft blood pressure, AoCKD-III, negative troponin, BNP 226. Urinalysis showed small amount of leukocyte, but urine collection was dirty catch with many squamous cells. Patient is admitted to inpatient for further evaluation and treatment. GI was consulted by ED.  Hospital Course:  Symptomatic anemia: Hemoglobin dropped from 8.6-7.0 with low blood pressure and lightheadedness sensation. Patient reported multiple black stools at home. Has been on procrit weekly (not given over last 2 weeks prior to admission since Hgb was above 10)  - will need close monitoring of Hgb trend - continue PPI - PRN antiemetics - Avoid NSAIDs and heparin products  - outpatient follow up for repeated enteroscopy and further GAVE treatment in 4 weeks -resume procrit injections  COPD with chronic resp failure: stable. 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 -will need outpatient follow up of her blood count -currently at baseline -no signs of overt bleeding  AoCKD-III: Baseline Cre is 1.4-1.7, her Cre was 1.89 on admission. Likely due to prerenal secondary to severe anemia and dehydration, and continuation of diruetics -Follow up renal function as an outpatient -Avoid ACEI and NSAIDs -lasix and spironolactone resumed at discharge -patient with Cr of 1.2 at discharge  NASH and cirrhosis -Will continue Lactulose and rifaximin -continue Nadolol (Variceal  bleed prophylaxis).  Diastolic CHF: Echo: LVEF 01/60/10 60 - 65%. Mild LVH. Appears euvolemic and asymptomatic at this moment. -during initial  resuscitation and blood transfusion she had mild exacerbation; but subsequently resolved with resumption of diuretics -BNP 486.8 -continue low sodium diet, daily weights and strict intake and output -no SOB at discharge -on b-blocker   DM-II: Last A1c 7.1, fairly well controled.  -will resume home hypoglycemic regimen -advise to follow low carb diet  Depression and anxiety: no suicidal or homicidal ideations. -Continue home medications  Right leg pain including tenderness over calf area: -LE doppler demonstrated no acute DVT; but with concerns for cyst vs chronic short term thrombus in popliteal area -no warm sensation and no swelling -at this moment patient not a candidate for any anticoagulation treatment due to GI bleed -discussed with vascular surgery and do not recommend IVC filter   HLD: continue zocor  Deconditioning and generalized weakness:  -Follow PT rec's will discharge to SNF for rehabilitation   Procedures:  See below for x-ray reports   EGD/enteroscopy: 7/2  Consultations:  GI  Discharge Exam: Filed Vitals:   02/28/15 0749  BP: 135/62  Pulse: 52  Temp: 98.2 F (36.8 C)  Resp: 19    General: afebrile, denies CP. Reports breathing is much better and back to baseline. No nausea or vomiting. No hematemesis. Hgb has remained stable at 8.4  Cardiovascular: positive murmur, no rubs, no gallops; rate controlled  Respiratory: good air movement, no wheezing or crackles on exam  Abdomen: soft, NT, ND, positive BS  Musculoskeletal: no edema or cyanosis  Discharge Instructions   Discharge Instructions    Diet - low sodium heart healthy    Complete by:  As directed      Discharge instructions    Complete by:  As directed   Follow low sodium diet Check weight on daily basis Repeat CBC in 5 days to follow Hgb trend Maintain adequate hydration  Follow up with PCP in 2 weeks Will need outpatient follow up with Dr. Olevia Perches in 4 weeks for repeat  enteroscopy and further treatment for GAVE          Current Discharge Medication List    CONTINUE these medications which have CHANGED   Details  ALPRAZolam (XANAX) 1 MG tablet Take 1 tablet (1 mg total) by mouth every 8 (eight) hours as needed. for anxiety Qty: 30 tablet, Refills: 0    furosemide (LASIX) 40 MG tablet Take 1 tablet (40 mg total) by mouth 2 (two) times daily. Qty: 30 tablet    oxyCODONE (OXY IR/ROXICODONE) 5 MG immediate release tablet Take 1 tablet (5 mg total) by mouth every 6 (six) hours as needed for severe pain. Qty: 30 tablet, Refills: 0    rifaximin (XIFAXAN) 550 MG TABS tablet Take 1 tablet (550 mg total) by mouth 2 (two) times daily.      CONTINUE these medications which have NOT CHANGED   Details  ADVAIR DISKUS 250-50 MCG/DOSE AEPB Inhale 1 puff into the lungs 2 (two) times daily. Refills: 4    albuterol (PROAIR HFA) 108 (90 BASE) MCG/ACT inhaler Inhale 2 puffs into the lungs every 6 (six) hours as needed for wheezing or shortness of breath.     b complex vitamins tablet Take 1 tablet by mouth daily.     buPROPion (WELLBUTRIN XL) 150 MG 24 hr tablet Take 1 tablet (150 mg total) by mouth daily. Qty: 90 tablet, Refills: 3    darbepoetin (ARANESP) 200 MCG/0.4ML  SOLN injection Inject 200 mcg into the skin every 14 (fourteen) days.    guaiFENesin (MUCINEX) 600 MG 12 hr tablet Take 600 mg by mouth 2 (two) times daily. 2 tablet by mouth once daily    Insulin Glargine (LANTUS SOLOSTAR) 100 UNIT/ML Solostar Pen Inject 140 Units into the skin every morning. Qty: 20 pen, Refills: 11    insulin lispro (HUMALOG) 100 UNIT/ML KiwkPen INJECT 5-30 UNITS INTO THE SKIN AT 4PM PER SLIDING SCALE (5 for every 50 over 150)    lactulose, encephalopathy, (CHRONULAC) 10 GM/15ML SOLN Take 30 mLs (20 g total) by mouth 2 (two) times daily. constipation Qty: 2700 mL, Refills: 6    levalbuterol (XOPENEX) 0.63 MG/3ML nebulizer solution Take 0.63 mg by nebulization 3 (three)  times daily.  Refills: 5    loratadine (CLARITIN) 10 MG tablet TAKE 1 TABLET EVERY DAY Qty: 30 tablet, Refills: 6    Multiple Vitamins-Minerals (CENTRUM SILVER PO) Take 1 tablet by mouth daily.     nadolol (CORGARD) 20 MG tablet TAKE 1/2 TABLET BY MOUTH DAILY Qty: 15 tablet, Refills: 1    pantoprazole (PROTONIX) 40 MG tablet Take 1 tablet (40 mg total) by mouth 2 (two) times daily. Qty: 60 tablet, Refills: 6    PRISTIQ 50 MG 24 hr tablet TAKE 3 TABLETS BY MOUTH EVERY DAY Qty: 90 tablet, Refills: 1    promethazine (PHENERGAN) 25 MG tablet Take 1 tablet (25 mg total) by mouth 2 (two) times daily as needed for nausea. Qty: 30 tablet, Refills: 3    QUEtiapine (SEROQUEL) 200 MG tablet Take 1 tablet (200 mg total) by mouth at bedtime. Qty: 30 tablet, Refills: 6    rOPINIRole (REQUIP) 4 MG tablet Take 1 tablet (4 mg total) by mouth at bedtime. Qty: 30 tablet, Refills: 11    Saline (OCEAN NASAL SPRAY NA) Place 1 spray into the nose 3 (three) times daily as needed (congestion).     simvastatin (ZOCOR) 40 MG tablet TAKE 1 TABLET BY MOUTH EVERY EVENING Qty: 30 tablet, Refills: 3    spironolactone (ALDACTONE) 25 MG tablet TAKE 1/2 TABLET BY MOUTH DAILY Qty: 15 tablet, Refills: 1    Vitamin D, Ergocalciferol, (DRISDOL) 50000 UNITS CAPS capsule TAKE 1 CAPSULE (50,000 UNITS TOTAL) BY MOUTH EVERY 7 (SEVEN) DAYS. TAKE ON FRIDAY Qty: 5 capsule, Refills: 3       Allergies  Allergen Reactions  . Acetaminophen Other (See Comments)    Liver dysfunction  . Aspirin Other (See Comments)    Causes nosebleeds  . Theophylline Nausea And Vomiting  . Penicillins Itching   Follow-up Information    Follow up with Ria Bush, MD. Schedule an appointment as soon as possible for a visit in 2 weeks.   Specialty:  Family Medicine   Contact information:   [place] [address] [phone]       Follow up with [name], MD. Schedule an appointment as soon as possible  for a visit in 4 weeks.   Specialty:  Gastroenterology   Why:  for repeat enteroscopy and further treatment of GAVE   Contact information:   [address] [place] [address] [phone]        The results of significant diagnostics from this hospitalization (including imaging, microbiology, ancillary and laboratory) are listed below for reference.    Significant Diagnostic Studies: Dg Chest 2 View  02/23/2015   CLINICAL DATA:  Weakness, bradycardia.  EXAM: CHEST  2 VIEW  COMPARISON:  January 25, 2015.  FINDINGS: Stable cardiomediastinal silhouette. No pneumothorax or pleural effusion is noted. Stable scarring is noted in both lung bases. No acute pulmonary disease is noted. The visualized skeletal structures are unremarkable.  IMPRESSION: No active cardiopulmonary disease.   Electronically Signed   By: Marijo Conception, M.D.   On: 02/23/2015 16:32    Microbiology: Recent Results (from the past 240 hour(s))  Urine culture     Status: None   Collection Time: 02/23/15  4:35 PM  Result Value Ref Range Status   Specimen Description URINE, CATHETERIZED  Final   Special Requests Normal  Final   Culture   Final    MULTIPLE SPECIES PRESENT, SUGGEST RECOLLECTION IF CLINICALLY INDICATED   Report Status 02/25/2015 FINAL  Final  MRSA PCR Screening     Status: None   Collection Time: 02/24/15  6:44 AM  Result Value Ref Range Status   MRSA by PCR NEGATIVE NEGATIVE Final    Comment:        The GeneXpert MRSA Assay (FDA approved for NASAL specimens only), is one component of a comprehensive MRSA colonization surveillance program. It is not intended to diagnose MRSA infection nor to guide or monitor treatment for MRSA infections.      Labs: Basic Metabolic Panel:  Recent Labs Lab 02/23/15 1556 02/24/15 0813 02/27/15 0430  NA 137 144 139  K 4.1 4.2 4.2  CL 105 113* 107  CO2 24 25 26   GLUCOSE 131* 57* 172*  BUN 103* 88* 30*  CREATININE 1.89* 1.43* 1.27*  CALCIUM 8.9 8.5*  8.8*   Liver Function Tests:  Recent Labs Lab 02/24/15 0813  AST 31  ALT 21  ALKPHOS 80  BILITOT 1.1  PROT 5.0*  ALBUMIN 2.8*   CBC:  Recent Labs Lab 02/23/15 1556  02/24/15 0813 02/25/15 0308 02/26/15 0230 02/27/15 0430 02/28/15 0631  WBC 2.3*  < > 2.5* 2.7* 3.4* 3.0* 2.6*  NEUTROABS 1.6*  --   --   --   --   --   --   HGB 7.0*  < > 8.3* 8.7* 8.7* 8.4* 8.4*  HCT 21.2*  < > 25.1* 26.6* 26.5* 25.4* 25.7*  MCV 99.5  < > 95.8 97.1 97.1 96.2 97.3  PLT 59*  < > 55* 62* 52* 54* 56*  < > = values in this interval not displayed.   BNP (last 3 results)  Recent Labs  01/25/15 0817 02/23/15 1556 02/24/15 0813  BNP 95.5 226.5* 486.8*    ProBNP (last 3 results)  Recent Labs  03/26/14 0259 08/11/14 1755  PROBNP 3352.0* 226.4*    CBG:  Recent Labs Lab 02/27/15 0737 02/27/15 1127 02/27/15 1659 02/27/15 2112 02/28/15 0746  GLUCAP 154* 230* 163* 264* 138*    Signed:  Barton Dubois  [facility] 02/28/2015, 11:21 AM

## 2015-02-28 NOTE — Progress Notes (Signed)
Discharged to [facility] by private car, accompanied by daughter.

## 2015-02-28 NOTE — Telephone Encounter (Signed)
Patient discharged on 7/5 for acute anemia s/p transfusion, complicated patient.  Discharged to SNF. Unsure when coming home. plz call for hosp f/u phone call and offer hosp f/u appt in 2 week(s).  Thanks.

## 2015-02-28 NOTE — Progress Notes (Signed)
Report called to Mariane Baumgarten, Therapist, sports at [facility].

## 2015-02-28 NOTE — Telephone Encounter (Signed)
Form for medication interaction in your IN box for review.

## 2015-02-28 NOTE — Care Management (Signed)
Important Message  Patient Details  Name: [name] MRN: [id number] Date of Birth: 12/21/1940   Medicare Important Message Given:  Yes-second notification given    Loann Quill 02/28/2015, 11:06 AM

## 2015-03-02 ENCOUNTER — Encounter: Payer: Self-pay | Admitting: *Deleted

## 2015-03-02 ENCOUNTER — Non-Acute Institutional Stay (SKILLED_NURSING_FACILITY): Payer: Medicare Other | Admitting: Internal Medicine

## 2015-03-02 DIAGNOSIS — I5032 Chronic diastolic (congestive) heart failure: Secondary | ICD-10-CM | POA: Diagnosis not present

## 2015-03-02 DIAGNOSIS — F322 Major depressive disorder, single episode, severe without psychotic features: Secondary | ICD-10-CM

## 2015-03-02 DIAGNOSIS — K209 Esophagitis, unspecified without bleeding: Secondary | ICD-10-CM

## 2015-03-02 DIAGNOSIS — E118 Type 2 diabetes mellitus with unspecified complications: Secondary | ICD-10-CM | POA: Diagnosis not present

## 2015-03-02 DIAGNOSIS — K31819 Angiodysplasia of stomach and duodenum without bleeding: Secondary | ICD-10-CM

## 2015-03-02 DIAGNOSIS — K7469 Other cirrhosis of liver: Secondary | ICD-10-CM

## 2015-03-02 DIAGNOSIS — F329 Major depressive disorder, single episode, unspecified: Secondary | ICD-10-CM

## 2015-03-02 DIAGNOSIS — IMO0002 Reserved for concepts with insufficient information to code with codable children: Secondary | ICD-10-CM

## 2015-03-02 DIAGNOSIS — K3189 Other diseases of stomach and duodenum: Secondary | ICD-10-CM | POA: Diagnosis not present

## 2015-03-02 DIAGNOSIS — R5381 Other malaise: Secondary | ICD-10-CM | POA: Diagnosis not present

## 2015-03-02 DIAGNOSIS — J449 Chronic obstructive pulmonary disease, unspecified: Secondary | ICD-10-CM | POA: Diagnosis not present

## 2015-03-02 DIAGNOSIS — N183 Chronic kidney disease, stage 3 (moderate): Secondary | ICD-10-CM

## 2015-03-02 DIAGNOSIS — E1165 Type 2 diabetes mellitus with hyperglycemia: Secondary | ICD-10-CM

## 2015-03-02 DIAGNOSIS — D638 Anemia in other chronic diseases classified elsewhere: Secondary | ICD-10-CM

## 2015-03-02 DIAGNOSIS — K766 Portal hypertension: Secondary | ICD-10-CM

## 2015-03-02 NOTE — Telephone Encounter (Signed)
Transitional care call attempted.  Left message for patient to return call. 

## 2015-03-03 ENCOUNTER — Encounter: Payer: Self-pay | Admitting: Internal Medicine

## 2015-03-03 ENCOUNTER — Encounter (HOSPITAL_COMMUNITY)
Admission: RE | Admit: 2015-03-03 | Discharge: 2015-03-03 | Disposition: A | Discharge status: Home / Self Care | Payer: Medicare Other | Source: Ambulatory Visit | Attending: Pulmonary Disease | Admitting: Pulmonary Disease

## 2015-03-03 DIAGNOSIS — D649 Anemia, unspecified: Secondary | ICD-10-CM | POA: Diagnosis not present

## 2015-03-03 DIAGNOSIS — K7460 Unspecified cirrhosis of liver: Secondary | ICD-10-CM | POA: Diagnosis not present

## 2015-03-03 DIAGNOSIS — Q2733 Arteriovenous malformation of digestive system vessel: Secondary | ICD-10-CM | POA: Insufficient documentation

## 2015-03-03 DIAGNOSIS — N289 Disorder of kidney and ureter, unspecified: Secondary | ICD-10-CM | POA: Insufficient documentation

## 2015-03-03 LAB — POCT HEMOGLOBIN-HEMACUE: Hemoglobin: 8.9 g/dL — ABNORMAL LOW (ref 12.0–15.0)

## 2015-03-03 MED ORDER — DARBEPOETIN ALFA 200 MCG/0.4ML IJ SOSY
PREFILLED_SYRINGE | INTRAMUSCULAR | Status: AC
  Filled 2015-03-03: qty 0.4

## 2015-03-03 MED ORDER — DARBEPOETIN ALFA 200 MCG/0.4ML IJ SOSY
200.0000 ug | PREFILLED_SYRINGE | INTRAMUSCULAR | Status: DC
  Administered 2015-03-03: 200 ug via SUBCUTANEOUS

## 2015-03-03 NOTE — Telephone Encounter (Signed)
Transitional care call attempted.  Left message for patient to return call. 

## 2015-03-03 NOTE — Progress Notes (Signed)
This encounter was created in error - please disregard.

## 2015-03-03 NOTE — Progress Notes (Signed)
Patient ID: [name], female   DOB: 06-Jan-1941, 74 y.o.   MRN: [id number]     Facility: [facility]    PCP: Ria Bush, MD  Code Status: full code  Allergies  Allergen Reactions  . Acetaminophen Other (See Comments)    Liver dysfunction  . Aspirin Other (See Comments)    Causes nosebleeds  . Theophylline Nausea And Vomiting  . Penicillins Itching    Chief Complaint  Patient presents with  . New Admit To SNF     HPI:  74 y.o. year old patient is here for short term rehabilitation post hospital admission from 02/23/2015- 02/28/2015 with drop in her hemoglobin, hypotension and bradycardia. She received 2 u prbc transfusion, underwent EGD on 02/25/15 showing esophagitis, protal HTN gastropathy and non bleeding GAVE treated with APC.  There were also few AVMS, one area was ablated with APC, no active bleeding was noted.  She has PMH of GAVE (gastric antral vascular actasia), NASH, liver cirrhosis, diastolic heart failure, portal hypertension, COPD on 3 L of oxygen chronically at home, aortic stenosis, CKD-III. She is seen in her room today with her friend present. She feels better in terms of energy but gets out of breath easily with minimal exertion. She has started working with therapy team. She is due to get her procrit injection tomorrow. She had noticed nose bleed last night which self resolved per patient. This is a chronic problem as per patient.  Review of Systems:  Constitutional: Negative for fever, chills, diaphoresis.  HENT: Negative for headache, congestion. Has runny nose on and off for 5 weeks. Recent hospital admission for copd exacerbation Eyes: Negative for eye pain, blurred vision, double vision and discharge.  Respiratory: Negative for cough, wheezing.   Cardiovascular: Negative for chest pain, palpitations, leg swelling.  Gastrointestinal: Negative for heartburn, nausea, vomiting, abdominal pain. Appetite is poor, noticed some  blood in toilet paer last night and has hx of hemorrhoids. Genitourinary: Negative for dysuria and flank pain.  Musculoskeletal: Negative for back pain, falls Skin: Negative for itching, rash.  Neurological: Negative for dizziness, tingling, focal weakness Psychiatric/Behavioral: Negative for depression   Past Medical History  Diagnosis Date  . Chronic airway obstruction, not elsewhere classified   . Unspecified chronic bronchitis   . Unspecified essential hypertension   . Non-obstructive CAD   . Palpitations   . Mild aortic stenosis     a. 03/2014 Valve area (VTI): 2.89 cm^2, Valve area (Vmax): 2.7 cm^2.  . Pure hypercholesterolemia   . Morbid obesity   . Esophageal reflux   . GAVE (gastric antral vascular ectasia)     angiodysplasia  . Diverticulosis of colon (without mention of hemorrhage)   . Benign neoplasm of colon   . Osteoarthrosis, unspecified whether generalized or localized, unspecified site   . Osteoporosis, unspecified   . Restless legs syndrome (RLS)   . Major depressive disorder, recurrent episode, severe, specified as with psychotic behavior   . Iron deficiency anemia secondary to blood loss (chronic)     a. frequent PRBC transfusions.  . Coarse tremors     a. arms.  . Carpal tunnel syndrome on both sides   . Chronic diastolic CHF (congestive heart failure)     a. 03/26/2014 Echo: EF 55-60%, no rwma, mild AS/AI, mod-sev Ca2+ MV annulus, mildly to mod dil LA.  [name] Asthma   . Type II diabetes mellitus   . H/O hiatal hernia   . 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 dx'd 1990's  . Adenomatous colon polyp   . Midsternal chest pain     a. conservatively managed ->poor candidate for cath/anticoagulation.  [name] Anxiety   . Portal hypertensive gastropathy   . Falls frequently     completed HHPT/OT 06/2014, PT unmet goals  . Recurrent UTI (urinary tract infection)     h/o hospitalization with urosepsis 2015, but thought large component  colonization/bacteriuria, only treat if symptomatic ([place])  . Hiatal hernia   . Thrombocytopenia   . Protein calorie malnutrition   . History of pneumonia     CAP 2015  . On home oxygen therapy     "3L; 24/7" (01/25/2015)  . OSA (obstructive sleep apnea)     a. does not use CPAP. (01/25/2015)  . GAVE (gastric antral vascular ectasia)    Past Surgical History  Procedure Laterality Date  . Hemorroidectomy    . Appendectomy    . Vaginal hysterectomy    . Breast biopsy      bilateral  . Cesarean section      x 3  . Appendectomy    . Esophagogastroduodenoscopy  06/12/2012    Procedure: ESOPHAGOGASTRODUODENOSCOPY (EGD);  Surgeon: Lafayette Dragon, MD;  Location: [facility];  Service: Endoscopy;  Laterality: N/A;  . Balloon dilation  06/12/2012    Procedure: BALLOON DILATION;  Surgeon: Lafayette Dragon, MD;  Location: [facility];  Service: Endoscopy;  Laterality: N/A;  ?balloon  . Esophagogastroduodenoscopy  09/10/2012    Procedure: ESOPHAGOGASTRODUODENOSCOPY (EGD);  Surgeon: Lafayette Dragon, MD;  Location: [facility];  Service: Endoscopy;  Laterality: N/A;  . Hot hemostasis  09/10/2012    Procedure: HOT HEMOSTASIS (ARGON PLASMA COAGULATION/BICAP);  Surgeon: Lafayette Dragon, MD;  Location: [facility];  Service: Endoscopy;  Laterality: N/A;  . Esophagogastroduodenoscopy N/A 01/18/2013    Procedure: ESOPHAGOGASTRODUODENOSCOPY (EGD);  Surgeon: Lafayette Dragon, MD;  Location: [facility];  Service: Endoscopy;  Laterality: N/A;  . Esophagogastroduodenoscopy N/A 05/24/2013    Procedure: ESOPHAGOGASTRODUODENOSCOPY (EGD);  Surgeon: Jerene Bears, MD;  Location: [place];  Service: Gastroenterology;  Laterality: N/A;  . Hot hemostasis N/A 05/24/2013    Procedure: HOT HEMOSTASIS (ARGON PLASMA COAGULATION/BICAP);  Surgeon: Jerene Bears, MD;  Location: [place];  Service: Gastroenterology;  Laterality: N/A;  . US echocardiography  07/2014    mild LVH, EF 60-65%, normal wall motion, mild AR, mod  dilated LA, mildly dilated RA, peak PA pressure 29mmHg  . Esophagogastroduodenoscopy N/A 10/20/2014    Procedure: ESOPHAGOGASTRODUODENOSCOPY (EGD);  Surgeon: Lafayette Dragon, MD;  Location: [facility];  Service: Endoscopy;  Laterality: N/A;  . Hot hemostasis N/A 10/20/2014    Procedure: HOT HEMOSTASIS (ARGON PLASMA COAGULATION/BICAP);  Surgeon: Lafayette Dragon, MD;  Location: [facility];  Service: Endoscopy;  Laterality: N/A;  . Enteroscopy N/A 02/25/2015    Procedure: ENTEROSCOPY;  Surgeon: [name] Ada, MD;  Location: [place];  Service: Endoscopy;  Laterality: N/A;   Social History:   reports that she quit smoking about 22 years ago. Her smoking use included Cigarettes. She has a 37.5 pack-year smoking history. She has never used smokeless tobacco. She reports that she does not drink alcohol or use illicit drugs.  Family History  Problem Relation Age of Onset  . Heart disease Mother   . Cervical cancer Mother   . Kidney disease Mother   . Diabetes Mother   . Breast cancer Sister   . Multiple sclerosis Sister   . Colon  cancer Neg Hx   . Breast cancer Maternal Aunt     x 2  . Diabetes Father   . Diabetes Sister   . Multiple sclerosis Other   . Heart attack Mother   . Heart attack Father   . Stroke Daughter   . Stroke Daughter     Medications: Patient's Medications  New Prescriptions   No medications on file  Previous Medications   ADVAIR DISKUS 250-50 MCG/DOSE AEPB    Inhale 1 puff into the lungs 2 (two) times daily.   ALBUTEROL (PROAIR HFA) 108 (90 BASE) MCG/ACT INHALER    Inhale 2 puffs into the lungs every 6 (six) hours as needed for wheezing or shortness of breath.    ALPRAZOLAM (XANAX) 1 MG TABLET    Take 1 tablet (1 mg total) by mouth every 8 (eight) hours as needed. for anxiety   B COMPLEX VITAMINS TABLET    Take 1 tablet by mouth daily.    BUPROPION (WELLBUTRIN XL) 150 MG 24 HR TABLET    Take 1 tablet (150 mg total) by mouth daily.   DARBEPOETIN (ARANESP) 200  MCG/0.4ML SOLN INJECTION    Inject 200 mcg into the skin every 14 (fourteen) days.   FUROSEMIDE (LASIX) 40 MG TABLET    Take 1 tablet (40 mg total) by mouth 2 (two) times daily.   GUAIFENESIN (MUCINEX) 600 MG 12 HR TABLET    Take 600 mg by mouth 2 (two) times daily. 2 tablet by mouth once daily   INSULIN GLARGINE (LANTUS SOLOSTAR) 100 UNIT/ML SOLOSTAR PEN    Inject 140 Units into the skin every morning.   INSULIN LISPRO (HUMALOG) 100 UNIT/ML KIWKPEN    INJECT 5-30 UNITS INTO THE SKIN AT 4PM PER SLIDING SCALE (5 for every 50 over 150)   LACTULOSE, ENCEPHALOPATHY, (CHRONULAC) 10 GM/15ML SOLN    Take 30 mLs (20 g total) by mouth 2 (two) times daily. constipation   LEVALBUTEROL (XOPENEX) 0.63 MG/3ML NEBULIZER SOLUTION    Take 0.63 mg by nebulization 3 (three) times daily.    LORATADINE (CLARITIN) 10 MG TABLET    TAKE 1 TABLET EVERY DAY   MULTIPLE VITAMINS-MINERALS (CENTRUM SILVER PO)    Take 1 tablet by mouth daily.    NADOLOL (CORGARD) 20 MG TABLET    TAKE 1/2 TABLET BY MOUTH DAILY   OXYCODONE (OXY IR/ROXICODONE) 5 MG IMMEDIATE RELEASE TABLET    Take 1 tablet (5 mg total) by mouth every 6 (six) hours as needed for severe pain.   PANTOPRAZOLE (PROTONIX) 40 MG TABLET    Take 1 tablet (40 mg total) by mouth 2 (two) times daily.   PRISTIQ 50 MG 24 HR TABLET    TAKE 3 TABLETS BY MOUTH EVERY DAY   PROMETHAZINE (PHENERGAN) 25 MG TABLET    Take 1 tablet (25 mg total) by mouth 2 (two) times daily as needed for nausea.   QUETIAPINE (SEROQUEL) 200 MG TABLET    Take 1 tablet (200 mg total) by mouth at bedtime.   RIFAXIMIN (XIFAXAN) 550 MG TABS TABLET    Take 1 tablet (550 mg total) by mouth 2 (two) times daily.   ROPINIROLE (REQUIP) 4 MG TABLET    Take 1 tablet (4 mg total) by mouth at bedtime.   SALINE (OCEAN NASAL SPRAY NA)    Place 1 spray into the nose 3 (three) times daily as needed (congestion).    SIMVASTATIN (ZOCOR) 40 MG TABLET    TAKE 1 TABLET BY MOUTH EVERY  EVENING   SPIRONOLACTONE (ALDACTONE) 25 MG  TABLET    TAKE 1/2 TABLET BY MOUTH DAILY   VITAMIN D, ERGOCALCIFEROL, (DRISDOL) 50000 UNITS CAPS CAPSULE    TAKE 1 CAPSULE (50,000 UNITS TOTAL) BY MOUTH EVERY 7 (SEVEN) DAYS. TAKE ON FRIDAY  Modified Medications   No medications on file  Discontinued Medications   No medications on file     Physical Exam: Filed Vitals:   03/02/15 1603  BP: 150/71  Pulse: 76  Temp: 99 F (37.2 C)  Resp: 17  Height: 5' 2.5" (1.588 m)  Weight: 222 lb 3.2 oz (100.789 kg)  SpO2: 99%    General- elderly female, obese, in no acute distress Head- normocephalic, atraumatic Nose- normal nasal mucosa, no maxillary or frontal sinus tenderness, no nasal discharge Throat- moist mucus membrane, poor dentition Neck- no cervical lymphadenopathy, no jugular vein distension Cardiovascular- normal s1,s2, no murmurs, palpable dorsalis pedis and radial pulses, trace leg edema Respiratory- bilateral poor air entry, on o2, no wheeze, no rhonchi, no crackles, no use of accessory muscles Abdomen- bowel sounds present, soft, non tender Musculoskeletal- able to move all 4 extremities, using walker and wheelchair Neurological- no focal deficit, alert and oriented Skin- warm and dry, multiple ecchymoses noted Psychiatry- normal mood and affect    Labs reviewed: Basic Metabolic Panel:  Recent Labs  06/09/14 1436  08/24/14 1446  12/13/14 0902  02/23/15 1556 02/24/15 0813 02/27/15 0430  NA 140  < > 136  < > 137  < > 137 144 139  K 3.6  < > 4.1  < > 4.3  < > 4.1 4.2 4.2  CL 103  < > 97  < > 99  < > 105 113* 107  CO2 30  < > 31  < > 32  < > 24 25 26   GLUCOSE 182*  < > 549*  < > 169*  < > 131* 57* 172*  BUN 33*  < > 42*  < > 80*  < > 103* 88* 30*  CREATININE 1.1  < > 1.4*  < > 1.76*  < > 1.89* 1.43* 1.27*  CALCIUM 9.0  < > 8.8  < > 9.6  < > 8.9 8.5* 8.8*  PHOS 3.7  --  3.7  --  5.1*  --   --   --   --   < > = values in this interval not displayed. Liver Function Tests:  Recent Labs  01/25/15 0817  01/26/15 0530 02/24/15 0813  AST 30 34 31  ALT 27 28 21   ALKPHOS 99 97 80  BILITOT 1.3* 0.9 1.1  PROT 6.3* 6.6 5.0*  ALBUMIN 3.3* 3.2* 2.8*   No results for input(s): LIPASE, AMYLASE in the last 8760 hours.  Recent Labs  03/26/14 0055 03/28/14 0331 07/28/14 1631  AMMONIA 62* 27 74*   CBC:  Recent Labs  01/06/15 1340  01/25/15 0817  02/23/15 1556  02/26/15 0230 02/27/15 0430 02/28/15 0631 03/03/15 1301  WBC 2.9*  --  3.8*  < > 2.3*  < > 3.4* 3.0* 2.6*  --   NEUTROABS 2.1  --  3.0  --  1.6*  --   --   --   --   --   HGB 8.8*  < > 8.7*  < > 7.0*  < > 8.7* 8.4* 8.4* 8.9*  HCT 27.5*  --  26.3*  < > 21.2*  < > 26.5* 25.4* 25.7*  --   MCV 102.6*  --  99.6  < > 99.5  < > 97.1 96.2 97.3  --   PLT 65*  --  108*  < > 59*  < > 52* 54* 56*  --   < > = values in this interval not displayed. Cardiac Enzymes:  Recent Labs  06/09/14 1436  08/11/14 1755 01/25/15 0817 01/25/15 1937  CKTOTAL 166  --   --   --   --   TROPONINI  --   < > <0.30 <0.03 <0.03  < > = values in this interval not displayed. BNP: Invalid input(s): POCBNP CBG:  Recent Labs  02/27/15 2112 02/28/15 0746 02/28/15 1210  GLUCAP 264* 138* 165*    Radiological Exams: 02/25/15 EGD In the distal esophagus, approximately 3-4 cm above the Z-line an isolated LA Grade D esophagitis was identified. No evidence of any active bleeding, but it was friable. No evidence of esophageal varices. In the gastric lumen there was evidence of portal HTN gastropathy. The fundus was negative for any varices. In the antrum a nonbleeding GAVE was again noted and it was treated with APC. The pediatric colonoscope was then advanced to the proximal jejunum and a couple of atypical appearing AVMs were noted. One area was ablated with APC, but the other was not able to be located again. This was secondary to the patient's intolerance to sedation. She was combative and vocalizing throughout the entire procedure. At the junction of D1 and  D2, two small typical appearing AVMs were found and ablated.  Assessment/Plan  Physical deconditioning Will have her work with physical therapy and occupational therapy team to help with gait training and muscle strengthening exercises.fall precautions. Skin care. Encourage to be out of bed.   GAVE Noted on EGD, s/p APC. Has follow up in 4 weeks for repeat EGD  COPD Continue o2, to use it continuously, continue bronchodilator treatment and monitor for early signs of exacerbation. Continue mucinex  Depression Stable mood, continue wellbutrin 150 mg daily with ativan 1 mg q8h prn for anxiety, pristiq and seroquel, monitor clinically  Anemia of chronic disease Gets procrit every 2 weeks with one due tomorrow. Have asked the scehduler to schedule appointment with [facility]/ day care centre ASAP for this. Recent 2 u prbc transfusion. Monitor cbc  Diastolic CHF Echo:  LVEF 37/34/28 60 - 65%. Mild LVH. Appears euvolemic and asymptomatic at this moment. continue low sodium diet, daily weights. Continue lasix 40 mg bid with b blocker and aldactone, monitor bmp and bp q shift for now with elevated SBP  Portal HTN gastropathy Continue nadolol 10 mg daily  Dm type 2 Last a1c 7.1. Continue lantus 140 u sq daily with lispro sliding scale. Monitor cbg  NASH aao x3, continue lactulose, rifaxamin and nadolol. Monitor mental status.  Esophagitis Noted on egd, continue protonix 40 mg bid, monitor for signs of bleed  ckd stage 3 Monitor renal function, baseline cr 1.4-1.7. Cr 1.2 on discharge  HLD continue zocor   Goals of care: short term rehabilitation   Labs/tests ordered: cbc, cmp  Family/ staff Communication: reviewed care plan with patient and nursing supervisor    Blanchie Serve, MD  [place] [phone] (Monday-Friday 8 am - 5 pm) [phone] (afterhours)

## 2015-03-06 ENCOUNTER — Encounter: Payer: Self-pay | Admitting: *Deleted

## 2015-03-06 NOTE — Telephone Encounter (Signed)
Unable to reach patient after multiple attempts.  No PCP follow up scheduled at this time.  (Patient does have routine 3 month follow up schedule 8/1)

## 2015-03-07 NOTE — Addendum Note (Signed)
Addended by: Inocencio Homes D on: 03/07/2015 09:15 PM   Modules accepted: Level of Service

## 2015-03-10 ENCOUNTER — Ambulatory Visit: Payer: Medicare Other | Admitting: Pulmonary Disease

## 2015-03-12 ENCOUNTER — Other Ambulatory Visit: Payer: Self-pay | Admitting: Pulmonary Disease

## 2015-03-13 ENCOUNTER — Non-Acute Institutional Stay (SKILLED_NURSING_FACILITY): Payer: Medicare Other | Admitting: Nurse Practitioner

## 2015-03-13 DIAGNOSIS — F322 Major depressive disorder, single episode, severe without psychotic features: Secondary | ICD-10-CM

## 2015-03-13 DIAGNOSIS — E118 Type 2 diabetes mellitus with unspecified complications: Secondary | ICD-10-CM

## 2015-03-13 DIAGNOSIS — E1165 Type 2 diabetes mellitus with hyperglycemia: Secondary | ICD-10-CM

## 2015-03-13 DIAGNOSIS — F329 Major depressive disorder, single episode, unspecified: Secondary | ICD-10-CM

## 2015-03-13 DIAGNOSIS — D638 Anemia in other chronic diseases classified elsewhere: Secondary | ICD-10-CM

## 2015-03-13 DIAGNOSIS — I5032 Chronic diastolic (congestive) heart failure: Secondary | ICD-10-CM

## 2015-03-13 DIAGNOSIS — K31819 Angiodysplasia of stomach and duodenum without bleeding: Secondary | ICD-10-CM

## 2015-03-13 DIAGNOSIS — K7469 Other cirrhosis of liver: Secondary | ICD-10-CM | POA: Diagnosis not present

## 2015-03-13 DIAGNOSIS — J4489 Other specified chronic obstructive pulmonary disease: Secondary | ICD-10-CM

## 2015-03-13 DIAGNOSIS — J449 Chronic obstructive pulmonary disease, unspecified: Secondary | ICD-10-CM

## 2015-03-13 DIAGNOSIS — IMO0002 Reserved for concepts with insufficient information to code with codable children: Secondary | ICD-10-CM

## 2015-03-13 NOTE — Progress Notes (Signed)
Patient ID: [name], female   DOB: 09-Sep-1940, 74 y.o.   MRN: [id number]    Nursing Home Location:  [place] of Service: SNF (31)  PCP: Ria Bush, MD  Allergies  Allergen Reactions  . Acetaminophen Other (See Comments)    Liver dysfunction  . Aspirin Other (See Comments)    Causes nosebleeds  . Theophylline Nausea And Vomiting  . Penicillins Itching    Chief Complaint  Patient presents with  . Discharge Note    HPI:  Patient is a 74 y.o. female seen today at [facility] for discharge home. PMH of GAVE (gastric antral vascular actasia), NASH, liver cirrhosis, diastolic heart failure, portal hypertension, COPD on 3 L of oxygen chronically at home, aortic stenosis, CKD-III. Pt at [facility] for short term rehabilitation post hospital admission from 02/23/2015- 02/28/2015 with drop in her hemoglobin, hypotension and bradycardia. She received 2 u prbc transfusion, underwent EGD on 02/25/15 showing esophagitis, protal HTN gastropathy and non bleeding GAVE treated with APC. There were also few AVMS, one area was ablated with APC, no active bleeding was noted. hgb has been monitored at [facility] and has remained stable. Receives procrit every 2 weeks. Pt also with advanced COPD on long term O2, gets short of breath easily with activity but this is baseline for her.   Review of Systems:  Review of Systems  Constitutional: Negative for fever and chills.  HENT: Negative for congestion and hearing loss.   Respiratory: Positive for cough and shortness of breath.   Cardiovascular: Negative for chest pain.  Gastrointestinal: Negative for abdominal pain, constipation and blood in stool.  Genitourinary: Negative for dysuria.  Musculoskeletal: Positive for myalgias.  Skin: Negative for rash.  Neurological: Positive for weakness (generalized). Negative for dizziness and headaches.  Psychiatric/Behavioral: Negative for behavioral  problems.    Past Medical History  Diagnosis Date  . Chronic airway obstruction, not elsewhere classified   . Unspecified chronic bronchitis   . Unspecified essential hypertension   . Non-obstructive CAD   . Palpitations   . Mild aortic stenosis     a. 03/2014 Valve area (VTI): 2.89 cm^2, Valve area (Vmax): 2.7 cm^2.  . Pure hypercholesterolemia   . Morbid obesity   . Esophageal reflux   . GAVE (gastric antral vascular ectasia)     angiodysplasia  . Diverticulosis of colon (without mention of hemorrhage)   . Benign neoplasm of colon   . Osteoarthrosis, unspecified whether generalized or localized, unspecified site   . Osteoporosis, unspecified   . Restless legs syndrome (RLS)   . Major depressive disorder, recurrent episode, severe, specified as with psychotic behavior   . Iron deficiency anemia secondary to blood loss (chronic)     a. frequent PRBC transfusions.  . Coarse tremors     a. arms.  . Carpal tunnel syndrome on both sides   . Chronic diastolic CHF (congestive heart failure)     a. 03/26/2014 Echo: EF 55-60%, no rwma, mild AS/AI, mod-sev Ca2+ MV annulus, mildly to mod dil LA.  [name] Asthma   . Type II diabetes mellitus   . H/O hiatal hernia   . 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 dx'd 1990's  . Adenomatous colon polyp   . Midsternal chest pain     a. conservatively managed ->poor candidate for cath/anticoagulation.  [name] Anxiety   . Portal hypertensive gastropathy   . Falls frequently  completed HHPT/OT 06/2014, PT unmet goals  . Recurrent UTI (urinary tract infection)     h/o hospitalization with urosepsis 2015, but thought large component colonization/bacteriuria, only treat if symptomatic ([place])  . Hiatal hernia   . Thrombocytopenia   . Protein calorie malnutrition   . History of pneumonia     CAP 2015  . On home oxygen therapy     "3L; 24/7" (01/25/2015)  . OSA (obstructive sleep apnea)     a. does not use CPAP. (01/25/2015)   . GAVE (gastric antral vascular ectasia)    Past Surgical History  Procedure Laterality Date  . Hemorroidectomy    . Appendectomy    . Vaginal hysterectomy    . Breast biopsy      bilateral  . Cesarean section      x 3  . Appendectomy    . Esophagogastroduodenoscopy  06/12/2012    Procedure: ESOPHAGOGASTRODUODENOSCOPY (EGD);  Surgeon: Lafayette Dragon, MD;  Location: [facility];  Service: Endoscopy;  Laterality: N/A;  . Balloon dilation  06/12/2012    Procedure: BALLOON DILATION;  Surgeon: Lafayette Dragon, MD;  Location: [facility]COPY;  Service: Endoscopy;  Laterality: N/A;  ?balloon  . Esophagogastroduodenoscopy  09/10/2012    Procedure: ESOPHAGOGASTRODUODENOSCOPY (EGD);  Surgeon: Lafayette Dragon, MD;  Location: [facility];  Service: Endoscopy;  Laterality: N/A;  . Hot hemostasis  09/10/2012    Procedure: HOT HEMOSTASIS (ARGON PLASMA COAGULATION/BICAP);  Surgeon: Lafayette Dragon, MD;  Location: [facility];  Service: Endoscopy;  Laterality: N/A;  . Esophagogastroduodenoscopy N/A 01/18/2013    Procedure: ESOPHAGOGASTRODUODENOSCOPY (EGD);  Surgeon: Lafayette Dragon, MD;  Location: [facility] ENDOSCOPY;  Service: Endoscopy;  Laterality: N/A;  . Esophagogastroduodenoscopy N/A 05/24/2013    Procedure: ESOPHAGOGASTRODUODENOSCOPY (EGD);  Surgeon: Jerene Bears, MD;  Location: [facility];  Service: Gastroenterology;  Laterality: N/A;  . Hot hemostasis N/A 05/24/2013    Procedure: HOT HEMOSTASIS (ARGON PLASMA COAGULATION/BICAP);  Surgeon: Jerene Bears, MD;  Location: [facility];  Service: Gastroenterology;  Laterality: N/A;  . US echocardiography  07/2014    mild LVH, EF 60-65%, normal wall motion, mild AR, mod dilated LA, mildly dilated RA, peak PA pressure 28mmHg  . Esophagogastroduodenoscopy N/A 10/20/2014    Procedure: ESOPHAGOGASTRODUODENOSCOPY (EGD);  Surgeon: Lafayette Dragon, MD;  Location: [facility];  Service: Endoscopy;  Laterality: N/A;  . Hot hemostasis N/A 10/20/2014    Procedure: HOT HEMOSTASIS  (ARGON PLASMA COAGULATION/BICAP);  Surgeon: Lafayette Dragon, MD;  Location: [facility];  Service: Endoscopy;  Laterality: N/A;  . Enteroscopy N/A 02/25/2015    Procedure: ENTEROSCOPY;  Surgeon: [name] Ada, MD;  Location: [facility];  Service: Endoscopy;  Laterality: N/A;   Social History:   reports that she quit smoking about 22 years ago. Her smoking use included Cigarettes. She has a 37.5 pack-year smoking history. She has never used smokeless tobacco. She reports that she does not drink alcohol or use illicit drugs.  Family History  Problem Relation Age of Onset  . Heart disease Mother   . Cervical cancer Mother   . Kidney disease Mother   . Diabetes Mother   . Breast cancer Sister   . Multiple sclerosis Sister   . Colon cancer Neg Hx   . Breast cancer Maternal Aunt     x 2  . Diabetes Father   . Diabetes Sister   . Multiple sclerosis Other   . Heart attack Mother   . Heart attack Father   . Stroke Daughter   .  Stroke Daughter     Medications: Patient's Medications  New Prescriptions   No medications on file  Previous Medications   ADVAIR DISKUS 250-50 MCG/DOSE AEPB    INHALE 1 PUFF INTO THE LUNGS 2 (TWO) TIMES DAILY.   ALBUTEROL (PROAIR HFA) 108 (90 BASE) MCG/ACT INHALER    Inhale 2 puffs into the lungs every 6 (six) hours as needed for wheezing or shortness of breath.    ALPRAZOLAM (XANAX) 1 MG TABLET    Take 1 tablet (1 mg total) by mouth every 8 (eight) hours as needed. for anxiety   B COMPLEX VITAMINS TABLET    Take 1 tablet by mouth daily.    BUPROPION (WELLBUTRIN XL) 150 MG 24 HR TABLET    Take 1 tablet (150 mg total) by mouth daily.   DARBEPOETIN (ARANESP) 200 MCG/0.4ML SOLN INJECTION    Inject 200 mcg into the skin every 14 (fourteen) days.   FUROSEMIDE (LASIX) 40 MG TABLET    Take 1 tablet (40 mg total) by mouth 2 (two) times daily.   GUAIFENESIN (MUCINEX) 600 MG 12 HR TABLET    Take 600 mg by mouth 2 (two) times daily. 2 tablet by mouth once daily   INSULIN  GLARGINE (LANTUS SOLOSTAR) 100 UNIT/ML SOLOSTAR PEN    Inject 140 Units into the skin every morning.   INSULIN LISPRO (HUMALOG) 100 UNIT/ML KIWKPEN    INJECT 5-30 UNITS INTO THE SKIN AT 4PM PER SLIDING SCALE (5 for every 50 over 150)   LACTULOSE, ENCEPHALOPATHY, (CHRONULAC) 10 GM/15ML SOLN    Take 30 mLs (20 g total) by mouth 2 (two) times daily. constipation   LEVALBUTEROL (XOPENEX) 0.63 MG/3ML NEBULIZER SOLUTION    Take 0.63 mg by nebulization 3 (three) times daily.    LORATADINE (CLARITIN) 10 MG TABLET    TAKE 1 TABLET EVERY DAY   MULTIPLE VITAMINS-MINERALS (CENTRUM SILVER PO)    Take 1 tablet by mouth daily.    NADOLOL (CORGARD) 20 MG TABLET    TAKE 1/2 TABLET BY MOUTH DAILY   OXYCODONE (OXY IR/ROXICODONE) 5 MG IMMEDIATE RELEASE TABLET    Take 1 tablet (5 mg total) by mouth every 6 (six) hours as needed for severe pain.   PANTOPRAZOLE (PROTONIX) 40 MG TABLET    Take 1 tablet (40 mg total) by mouth 2 (two) times daily.   PRISTIQ 50 MG 24 HR TABLET    TAKE 3 TABLETS BY MOUTH EVERY DAY   PROMETHAZINE (PHENERGAN) 25 MG TABLET    Take 1 tablet (25 mg total) by mouth 2 (two) times daily as needed for nausea.   QUETIAPINE (SEROQUEL) 200 MG TABLET    Take 1 tablet (200 mg total) by mouth at bedtime.   RIFAXIMIN (XIFAXAN) 550 MG TABS TABLET    Take 1 tablet (550 mg total) by mouth 2 (two) times daily.   ROPINIROLE (REQUIP) 4 MG TABLET    Take 1 tablet (4 mg total) by mouth at bedtime.   SALINE (OCEAN NASAL SPRAY NA)    Place 1 spray into the nose 3 (three) times daily as needed (congestion).    SIMVASTATIN (ZOCOR) 40 MG TABLET    TAKE 1 TABLET BY MOUTH EVERY EVENING   SPIRONOLACTONE (ALDACTONE) 25 MG TABLET    TAKE 1/2 TABLET BY MOUTH DAILY   VITAMIN D, ERGOCALCIFEROL, (DRISDOL) 50000 UNITS CAPS CAPSULE    TAKE 1 CAPSULE (50,000 UNITS TOTAL) BY MOUTH EVERY 7 (SEVEN) DAYS. TAKE ON FRIDAY  Modified Medications   No  medications on file  Discontinued Medications   No medications on file     Physical  Exam: Filed Vitals:   03/13/15 1444  BP: 122/66  Pulse: 60  Resp: 20  SpO2: 97%    Physical Exam  Constitutional: She is oriented to person, place, and time. No distress.  HENT:  Head: Normocephalic and atraumatic.  Eyes: EOM are normal. Pupils are equal, round, and reactive to light.  Neck: Normal range of motion. Neck supple. No JVD present.  Cardiovascular: Normal rate, regular rhythm, normal heart sounds and intact distal pulses.   Pulmonary/Chest: Effort normal.  Diminished breath sounds  Abdominal: Soft. Bowel sounds are normal. She exhibits no distension. There is no tenderness.  Musculoskeletal: Normal range of motion. She exhibits edema (trace bilaterally).  Neurological: She is alert and oriented to person, place, and time.  Skin: Skin is warm and dry.  Psychiatric:  Flat affect    Labs reviewed: Basic Metabolic Panel:  Recent Labs  06/09/14 1436  08/24/14 1446  12/13/14 0902  02/23/15 1556 02/24/15 0813 02/27/15 0430  NA 140  < > 136  < > 137  < > 137 144 139  K 3.6  < > 4.1  < > 4.3  < > 4.1 4.2 4.2  CL 103  < > 97  < > 99  < > 105 113* 107  CO2 30  < > 31  < > 32  < > 24 25 26   GLUCOSE 182*  < > 549*  < > 169*  < > 131* 57* 172*  BUN 33*  < > 42*  < > 80*  < > 103* 88* 30*  CREATININE 1.1  < > 1.4*  < > 1.76*  < > 1.89* 1.43* 1.27*  CALCIUM 9.0  < > 8.8  < > 9.6  < > 8.9 8.5* 8.8*  PHOS 3.7  --  3.7  --  5.1*  --   --   --   --   < > = values in this interval not displayed. Liver Function Tests:  Recent Labs  01/25/15 0817 01/26/15 0530 02/24/15 0813  AST 30 34 31  ALT 27 28 21   ALKPHOS 99 97 80  BILITOT 1.3* 0.9 1.1  PROT 6.3* 6.6 5.0*  ALBUMIN 3.3* 3.2* 2.8*   No results for input(s): LIPASE, AMYLASE in the last 8760 hours.  Recent Labs  03/26/14 0055 03/28/14 0331 07/28/14 1631  AMMONIA 62* 27 74*   CBC:  Recent Labs  01/06/15 1340  01/25/15 0817  02/23/15 1556  02/26/15 0230 02/27/15 0430 02/28/15 0631 03/03/15 1301    WBC 2.9*  --  3.8*  < > 2.3*  < > 3.4* 3.0* 2.6*  --   NEUTROABS 2.1  --  3.0  --  1.6*  --   --   --   --   --   HGB 8.8*  < > 8.7*  < > 7.0*  < > 8.7* 8.4* 8.4* 8.9*  HCT 27.5*  --  26.3*  < > 21.2*  < > 26.5* 25.4* 25.7*  --   MCV 102.6*  --  99.6  < > 99.5  < > 97.1 96.2 97.3  --   PLT 65*  --  108*  < > 59*  < > 52* 54* 56*  --   < > = values in this interval not displayed. TSH:  Recent Labs  03/26/14 0259 08/11/14 0114  TSH 1.150 1.970   A1C:  Lab Results  Component Value Date   HGBA1C 7.1* 01/26/2015   Lipid Panel:  Recent Labs  03/22/14 1103 12/13/14 0902  CHOL 108 115  HDL 36.00* 40.50  LDLCALC 57 52  TRIG 75.0 113.0  CHOLHDL 3 3      Assessment/Plan  1. Chronic diastolic CHF (congestive heart failure) Stable, euvolemic. continue low sodium diet, daily weights. Continue lasix 40 mg bid with b blocker and aldactone, and follow up outpt  2. COPD (chronic obstructive pulmonary disease) with chronic bronchitis Stable, Continue chronic o2, bronchodilator treatment and mucinex  3. Diabetes mellitus type 2 with complications, uncontrolled Stable, Continue lantus 140 u sq daily with sliding scale as pt was doing prior to admission.   4. Anemia of chronic disease Closely monitored outpt, received 2 units in PRBC in hospital. Gets procrit every 2 weeks.   5. GAVE (gastric antral vascular ectasia) Noted with EGD, s/p APC. Has follow up for repeat EGD  6. Major depression, chronic Mood stable, cont current regimen  7. Other cirrhosis of liver Continue lactulose, rifaxamin and nadolol.  pt is stable for discharge-will need PT/OT per home health. No DME needed. Rx written.  will need to follow up with PCP within 2 weeks.    Carlos American. Harle Battiest  [facility] [phone] 8 am - 5 pm) [phone] (after hours)

## 2015-03-15 ENCOUNTER — Ambulatory Visit: Payer: Medicare Other | Admitting: Endocrinology

## 2015-03-17 ENCOUNTER — Encounter (HOSPITAL_COMMUNITY)
Admission: RE | Admit: 2015-03-17 | Discharge: 2015-03-17 | Disposition: A | Discharge status: Home / Self Care | Payer: Medicare Other | Source: Ambulatory Visit | Attending: Family Medicine | Admitting: Family Medicine

## 2015-03-17 DIAGNOSIS — Q2733 Arteriovenous malformation of digestive system vessel: Secondary | ICD-10-CM | POA: Diagnosis not present

## 2015-03-17 DIAGNOSIS — D649 Anemia, unspecified: Secondary | ICD-10-CM | POA: Diagnosis not present

## 2015-03-17 DIAGNOSIS — N289 Disorder of kidney and ureter, unspecified: Secondary | ICD-10-CM | POA: Diagnosis not present

## 2015-03-17 DIAGNOSIS — K7460 Unspecified cirrhosis of liver: Secondary | ICD-10-CM | POA: Diagnosis not present

## 2015-03-17 LAB — POCT HEMOGLOBIN-HEMACUE: HEMOGLOBIN: 9.8 g/dL — AB (ref 12.0–15.0)

## 2015-03-17 MED ORDER — DARBEPOETIN ALFA 200 MCG/0.4ML IJ SOSY
PREFILLED_SYRINGE | INTRAMUSCULAR | Status: AC
  Administered 2015-03-17: 200 ug via SUBCUTANEOUS
  Filled 2015-03-17: qty 0.4

## 2015-03-20 ENCOUNTER — Other Ambulatory Visit: Payer: Self-pay | Admitting: *Deleted

## 2015-03-20 NOTE — Patient Outreach (Signed)
03/20/15- RN CM called pt for transition of care week 1, (while covering for colleague Deloria Lair NP) HIPAA verfied, pt reports she has all medications and taking as prescribed and "looks after" her own medications, states taking lasix 80mg  in am, 40 mg in pm (per discharge summary from [facility]).  pt weighs daily- weight today 215 pounds, checks CBG BID with fasting today 227. RN CM reviewed importance of calling for weight gain 3 pounds overnight or 5 pounds in one week and importance of calling early for change in health condition/ symptoms.  Pt is active in her own care and knowledgeable about medications, answers questions appropriately. Pt states she continues to receive aranesp injections and anemia management is ongoing.  RN CM faxed today's note to primary MD Dr. Danise Mina.  Outpatient Encounter Prescriptions as of 03/20/2015  Medication Sig Note  . ADVAIR DISKUS 250-50 MCG/DOSE AEPB INHALE 1 PUFF INTO THE LUNGS 2 (TWO) TIMES DAILY.   [name] albuterol (PROAIR HFA) 108 (90 BASE) MCG/ACT inhaler Inhale 2 puffs into the lungs every 6 (six) hours as needed for wheezing or shortness of breath.    . ALPRAZolam (XANAX) 1 MG tablet Take 1 tablet (1 mg total) by mouth every 8 (eight) hours as needed. for anxiety   . b complex vitamins tablet Take 1 tablet by mouth daily.    [name] buPROPion (WELLBUTRIN XL) 150 MG 24 hr tablet Take 1 tablet (150 mg total) by mouth daily.   . darbepoetin (ARANESP) 200 MCG/0.4ML SOLN injection Inject 200 mcg into the skin every 14 (fourteen) days. 10/13/2014: Next dose due 10/14/2014  . furosemide (LASIX) 40 MG tablet Take 1 tablet (40 mg total) by mouth 2 (two) times daily. 03/20/2015: Pt states she takes 80mg  in am and 40 mg in pm  . guaiFENesin (MUCINEX) 600 MG 12 hr tablet Take 600 mg by mouth 2 (two) times daily. 2 tablet by mouth once daily   . Insulin Glargine (LANTUS SOLOSTAR) 100 UNIT/ML Solostar Pen Inject 140 Units into the skin every morning.   . insulin lispro  (HUMALOG) 100 UNIT/ML KiwkPen INJECT 5-30 UNITS INTO THE SKIN AT 4PM PER SLIDING SCALE (5 for every 50 over 150) 02/23/2015: 10 units last dose  . lactulose, encephalopathy, (CHRONULAC) 10 GM/15ML SOLN Take 30 mLs (20 g total) by mouth 2 (two) times daily. constipation (Patient taking differently: Take 20 g by mouth daily as needed (constipation). constipation)   . levalbuterol (XOPENEX) 0.63 MG/3ML nebulizer solution Take 0.63 mg by nebulization 3 (three) times daily.    [name] loratadine (CLARITIN) 10 MG tablet TAKE 1 TABLET EVERY DAY   . Multiple Vitamins-Minerals (CENTRUM SILVER PO) Take 1 tablet by mouth daily.    . nadolol (CORGARD) 20 MG tablet TAKE 1/2 TABLET BY MOUTH DAILY   . oxyCODONE (OXY IR/ROXICODONE) 5 MG immediate release tablet Take 1 tablet (5 mg total) by mouth every 6 (six) hours as needed for severe pain.   . pantoprazole (PROTONIX) 40 MG tablet Take 1 tablet (40 mg total) by mouth 2 (two) times daily.   [name] PRISTIQ 50 MG 24 hr tablet TAKE 3 TABLETS BY MOUTH EVERY DAY   . promethazine (PHENERGAN) 25 MG tablet Take 1 tablet (25 mg total) by mouth 2 (two) times daily as needed for nausea.   [name] QUEtiapine (SEROQUEL) 200 MG tablet Take 1 tablet (200 mg total) by mouth at bedtime.   . rifaximin (XIFAXAN) 550 MG TABS tablet Take 1 tablet (550 mg total) by mouth  2 (two) times daily.   [name] rOPINIRole (REQUIP) 4 MG tablet Take 1 tablet (4 mg total) by mouth at bedtime.   . Saline (OCEAN NASAL SPRAY NA) Place 1 spray into the nose 3 (three) times daily as needed (congestion).    . simvastatin (ZOCOR) 40 MG tablet TAKE 1 TABLET BY MOUTH EVERY EVENING   . spironolactone (ALDACTONE) 25 MG tablet TAKE 1/2 TABLET BY MOUTH DAILY   . Vitamin D, Ergocalciferol, (DRISDOL) 50000 UNITS CAPS capsule TAKE 1 CAPSULE (50,000 UNITS TOTAL) BY MOUTH EVERY 7 (SEVEN) DAYS. TAKE ON FRIDAY    No facility-administered encounter medications on file as of 03/20/2015.    [facility] CM Care Plan Problem One        Patient  Outreach Telephone from 03/20/2015 in [place] Problem One  Pt high risk for readmission related to multiple health conditions   Care Plan for Problem One  Active   [facility] CM Short Term Goal #1 (0-30 days)  pt will have no readmissions related to chronic health conditions within 30 days   [facility] CM Short Term Goal #1 Start Date  03/20/15   Interventions for Short Term Goal #1  RN CM reviewed upcoming MD appointments and importance of keeping all follow up appointments, RN CM reviewed medications with pt (purpose and dosage).    [place] Plan Problem Two        Patient Outreach Telephone from 03/20/2015 in [place] CM Short Term Goal #1 (0-30 days)      [facility] CM Short Term Goal #2 (0-30 days)       [facility] CM Care Plan Problem Three        Patient Outreach Telephone from 03/20/2015 in [place] Problem Three      [facility] CM Short Term Goal #1 (0-30 days)      Interventions for Short Term Goal #1          PLAN Continue weekly transition of care outreach Assess weight, CBG Follow up MD appointment 03/31/15  Jacqlyn Larsen [facility], BSN [place] Coordinator [phone]

## 2015-03-21 ENCOUNTER — Telehealth: Payer: Self-pay | Admitting: Pulmonary Disease

## 2015-03-21 NOTE — Telephone Encounter (Signed)
Pt aware that samples placed up front. Nothing further needed.

## 2015-03-22 DIAGNOSIS — I509 Heart failure, unspecified: Secondary | ICD-10-CM | POA: Diagnosis not present

## 2015-03-22 DIAGNOSIS — D61818 Other pancytopenia: Secondary | ICD-10-CM | POA: Diagnosis not present

## 2015-03-22 DIAGNOSIS — N390 Urinary tract infection, site not specified: Secondary | ICD-10-CM | POA: Diagnosis not present

## 2015-03-22 DIAGNOSIS — E119 Type 2 diabetes mellitus without complications: Secondary | ICD-10-CM | POA: Diagnosis not present

## 2015-03-22 DIAGNOSIS — J441 Chronic obstructive pulmonary disease with (acute) exacerbation: Secondary | ICD-10-CM | POA: Diagnosis not present

## 2015-03-22 DIAGNOSIS — D649 Anemia, unspecified: Secondary | ICD-10-CM | POA: Diagnosis not present

## 2015-03-22 DIAGNOSIS — F419 Anxiety disorder, unspecified: Secondary | ICD-10-CM | POA: Diagnosis not present

## 2015-03-22 DIAGNOSIS — K7581 Nonalcoholic steatohepatitis (NASH): Secondary | ICD-10-CM | POA: Diagnosis not present

## 2015-03-24 DIAGNOSIS — K7581 Nonalcoholic steatohepatitis (NASH): Secondary | ICD-10-CM | POA: Diagnosis not present

## 2015-03-24 DIAGNOSIS — E119 Type 2 diabetes mellitus without complications: Secondary | ICD-10-CM | POA: Diagnosis not present

## 2015-03-24 DIAGNOSIS — I509 Heart failure, unspecified: Secondary | ICD-10-CM | POA: Diagnosis not present

## 2015-03-24 DIAGNOSIS — D61818 Other pancytopenia: Secondary | ICD-10-CM | POA: Diagnosis not present

## 2015-03-24 DIAGNOSIS — D649 Anemia, unspecified: Secondary | ICD-10-CM | POA: Diagnosis not present

## 2015-03-24 DIAGNOSIS — N390 Urinary tract infection, site not specified: Secondary | ICD-10-CM | POA: Diagnosis not present

## 2015-03-24 DIAGNOSIS — F419 Anxiety disorder, unspecified: Secondary | ICD-10-CM | POA: Diagnosis not present

## 2015-03-24 DIAGNOSIS — J441 Chronic obstructive pulmonary disease with (acute) exacerbation: Secondary | ICD-10-CM | POA: Diagnosis not present

## 2015-03-27 ENCOUNTER — Ambulatory Visit: Payer: Medicare Other | Admitting: Family Medicine

## 2015-03-27 ENCOUNTER — Other Ambulatory Visit: Payer: Self-pay | Admitting: *Deleted

## 2015-03-27 ENCOUNTER — Encounter: Payer: Self-pay | Admitting: *Deleted

## 2015-03-27 DIAGNOSIS — K7581 Nonalcoholic steatohepatitis (NASH): Secondary | ICD-10-CM | POA: Diagnosis not present

## 2015-03-27 DIAGNOSIS — I509 Heart failure, unspecified: Secondary | ICD-10-CM | POA: Diagnosis not present

## 2015-03-27 DIAGNOSIS — F419 Anxiety disorder, unspecified: Secondary | ICD-10-CM

## 2015-03-27 DIAGNOSIS — D649 Anemia, unspecified: Secondary | ICD-10-CM | POA: Diagnosis not present

## 2015-03-27 DIAGNOSIS — E119 Type 2 diabetes mellitus without complications: Secondary | ICD-10-CM

## 2015-03-27 DIAGNOSIS — N390 Urinary tract infection, site not specified: Secondary | ICD-10-CM | POA: Diagnosis not present

## 2015-03-27 DIAGNOSIS — J441 Chronic obstructive pulmonary disease with (acute) exacerbation: Secondary | ICD-10-CM | POA: Diagnosis not present

## 2015-03-27 DIAGNOSIS — D61818 Other pancytopenia: Secondary | ICD-10-CM | POA: Diagnosis not present

## 2015-03-27 NOTE — Patient Outreach (Signed)
Second Transition of care call:  Pt is well known to me. She reports she is doing fair. She is getting both PT and OT. She will see Dr. Lenna Gilford on Wednesday and Dr. Danise Mina on Friday this week. She reports her breathing is OK, her weight was 209 this am and her glucose was 171. Her neighbor is staying with her in the mornings and her sister in the afternoons until her husband comes home from work.  We scheduled a home visit for next Monday, at 1:00 pm.  Deloria Lair [facility] [place] [phone]

## 2015-03-29 ENCOUNTER — Ambulatory Visit (INDEPENDENT_AMBULATORY_CARE_PROVIDER_SITE_OTHER): Payer: Medicare Other | Admitting: Pulmonary Disease

## 2015-03-29 VITALS — BP 130/72 | HR 50 | Temp 97.3°F | Wt 215.0 lb

## 2015-03-29 DIAGNOSIS — J449 Chronic obstructive pulmonary disease, unspecified: Secondary | ICD-10-CM | POA: Diagnosis not present

## 2015-03-29 DIAGNOSIS — R5381 Other malaise: Secondary | ICD-10-CM | POA: Diagnosis not present

## 2015-03-29 DIAGNOSIS — J9611 Chronic respiratory failure with hypoxia: Secondary | ICD-10-CM | POA: Diagnosis not present

## 2015-03-29 NOTE — Progress Notes (Signed)
Subjective:    Patient ID: [name], female    DOB: 07-07-1941, 74 y.o.   MRN: [id number]  HPI 74 y/o WF here for a follow up visit... she has mult med problems as noted below>  ~  SEE PREV EPIC NOTES FOR OLDER DATA >>    LABS 1/14:  CBC- Hg=7.0 (down 4pts) Hct=21.9 Fe=51 (11%sat) B12=623, Protime=11.6/ 1.1, AFP=pending; Abd sonar per DrDBrodie- pending...  LABS 7/14:  CBC- Hg=8.8, MCV=93, WBC=3.2, Plat=74K;  Fe=58 (14%sat), Ferritin=12.5.Marland KitchenMarland Kitchen  NOTE>> Fe is dropping & we decided to infuse FERAHEME 54m x2 as outpt... ROV 160mo CXR 7/14 showed norm heart size, clear lungs, no edema etc...  LABS 8/14:  Chems- ok x BS=257 Cr=1.4;  CBC- Hg=8.8 Fe=138 (35%sat)...   ~  October 13, 2013:  2m1moV & [name] has been followed by DrDBrodie & was re-hosp 2/8 - 10/04/13 by [facility] w/ symptomatic anemia, iron deficiency, & her mult chronic medical issues; she was transfused & given IV iron and she has had f/u OV by GI- DrDBrodie who has ordered f/u blood counts and iron levels under her direction; [facility]... [facility] "we talk about my problems, she does blood work, and she communicates w/ DrBrodie"...  Pt has also had f/u visits w/ [facility], and DrEllison for DM/ Endocrine...     COPD> on Home O2, Advair250, NEBS, Mucinex; remote smoker quit yrs ago; known obstructive & restrictive lung dis; CXR 2/15 showed mild cardiomeg, mild vasc congestion, left mid lung scarring & peribronch thickening, otherw clear/NAD; CTA 3/13 was neg for PE & showed some atelec, coronary calcif, multilevel spondylosis, & cirrhosis of the liver; she continues on O2 by [place] at 3L/min...     HBP> on Corgard20-1/2, Lisinopril5, Lasix20-2Bid, K10Qod; BP= 122/68 & she has mult somatic complaints but no specific CP, palpit, ch in SOB or edema; continue low sodium & wt reduction.     CAD, DiastolicCHF, & mild AS/AI>  known non-obstructive CAD; hx atypCP/ CWP; no angina & she remains way too sedentary; discussed risk factor reduction strategy & she saw [facility] 12/14> fragile status, chr dyspnea, no angina, too sedentary; last 2DEcho 7/13 w/ mild LVH, norm LVF w/ EF=55-60% & norm wall motion, mild AS&AI, Gr2DD;  EKG 12/14 w/ SBrady, rate47, 1st degree AVB, NSSTTWA; Corgard decr from 20=>4m67m..    Chol> on Simva40; not really on diet & we discussed this; last FLP 5/13 showed TChol 94, TG 130, HDL 32, LDL 36; continue same, needs exercise, & ret for FLP f/u...    DM> on Lantus&Humalog per DrEllison; needs much better diet & exercise program! Not checking BS at home; labs from [id number] BS=130-260 and last A1c was 1/15= 6.5    Obesity> way too sedentary, ?on diet & her wt is 230# today; we discussed diet, exercise, wt reduction strategies...    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    DJD/ FM/ ?osteopenia/ VitD defic> she  has mult somatic complaints & takes Tylenol prn; on MVI + VitD 50K weekly...    RLS> on Requip4Qhs & off Sinemet from DrDohmeier w/ fair control of symptoms- prev neuro note is reviewed...    Anxiety/ Depression/ PsycheHx> on RX per Psychiatry DrHejazi (we don't have notes from him) & she does not know her meds, didn't bring med bottles or med list!  As best we can tell she is on Pristiq100, WellbutrinXL150, Seroquel100-4Qhs, Xanax72m prn- all from Psychiatry & she is reminded to bring all bottles to every visit... We reviewed prob list, meds, xrays and labs> see below for updates >> I will continue to eval & treat her COPD, chr  resp issues as I transition out of my primary care roll...   ~  Jan 10, 2014:  335moOV & [name] is [facility] to [facility] DrCooper for Cards, DrDBrodie for GI> their notes are reviewed...    Here for a 89m48moV Pulmonary follow up- Combined obstructive & restrictive lung disease> on Home O2, Advair250, NEBS, Mucinex; remote smoker quit yrs ago; known obstructive & restrictive lung dis by PFTs; CXR 2/15 showed mild cardiomeg, mild vasc congestion, left mid lung scarring & peribronch thickening, otherw clear/NAD; CTA 3/13 was neg for PE & showed some atelec, coronary calcif, multilevel spondylosis, & cirrhosis of the liver; she continues on O2 by [place] at 3L/min...     Breathing has been stable and chest sounds good;  She has not been taking the Mucinex regularly 7 is advised 2Bid w/ fluids;  She desperately needs to lose weight but is unable to exercise she says, noted DOE w/ ADLs but has not been exercising- rec to do chair exercises, the advance to walking, stairs in a progressive fashion...    [facility] to [facility], periodic transfusions, regular Procrit injections, etc...   ~  May 10, 2014:  59mo58mo & [name] was Adm 7/31 - 03/31/14 by [facility] w/ UTI, hi fever, encephalopathy, fall w/ sternal fx & 2 right rib fxs, superimposed on her chronic issues of COPD, chr diastolic CHF, cirrhosis, GIB/ AVMs, anemia, etc;  She recovered w/ antibiotics, fluids, supportive care and went for NHP/rehab where she was managed by [place] disch she has seen DrGuttierez for Primary Care & his notes are reviewed...     From the pulmonary standpoint> [name] on O2 at 3L/min by [place] (O2sat=99%), Advair250Bid, Mucinex 600Bid, & ProventilHFA rescue inhaler as needed (ave 1-2 daily);  She reports that her breathing is about at baseline;  Exam shows decr BS bilat w/o wheezing/ rales/ signs of  consolidation;  She is rec to continue same meds and concentrate on deep breaths to keep lungs well expanded (Incentive spirometry);  She is doing home PT & slowly improved...     She continues to follow up w/ Cards for her non-obstructive CAD, mild AS, diastolic CHF, obesity (wt is actually down 15# to 219# today);  Med list includes: Corgard20, Lasix40Bid, Aldactone25-1/2 tab/d; BP= 116/68 & she is still very sore...    She is followed for GI by DrDBrodie; cirrhosis likely from steatohepatis, portal HTN, gastropathy & AVMs s/p ablation; meds include: Xifaxan, Lactulose, Aldactone...    DrGuttierez is monitoring her blood counts (Hg was down to 7.5=>9.3), renal function (Cr was up to 3 & is now back to norm=0.9), etc & provides PC transfusions when needed & Aranesp Q2wks...Marland KitchenMarland Kitchen  We reviewed prob list, meds, xrays and labs> see below for updates >>   CXR 7/28 - 03/28/14 showed mid sternal fx, right lat rib fx, right perihilar opac- ?consolidation, mild interstitial edema, cardiomegaly, prob pulm arterial HTN...   CT Chest 03/25/14 showed sternal fx w/ sm hematoma, acute fx of right lat 9th rib, plus additional old rib fxs, patchy atx bilat, cirrhotic liver & enlarged spleen  2DEcho 8/15 showed norm LV size & function w/ EF=55-60%, no regional wall motion abn, mildly thickened AV leaflets, mild AS & Ai, calcif of mitral annulus, mod LA dil...  ~  September 09, 2014:  48mo ROV & [name] has seen DrGutierrez several times for Primary Care (DM, UTIs, RI, Anemia on Aranesp, anxiety, etc), GI- LoriH (cirrhosis, portal hypertensive gastropathy, AVMs, chr GI blood loss), DrCooper for Cards (CAD, diastolic CH, mildAS);  She was Hosp again 12/17 - 08/16/14 by [facility] w/ SOB- felt to be multifactorial w/ components of bronchitic infection superimposed on her mild obstructive/ restrictive lung dis (neg CXR & low prob V/Q scan), diastolic CHF (XH=74-14%, ELT=532)), anemia (Hg=7.6 & Tx 2uPCs), and her pain meds & psychotropics all  playing a roll (stress- husb in hosp w/ AFib); she was disch on Pred taper, Levaquin, incr dose of Lasix, & off her prev Lisinopril;  Since her disch she has had f/u DrCooper for Cards & he placed her on a sliding scale regimen for Lasix based on weight (currently on extra Lasix for 2d due to wt gain)...     From the pulm standpoint she is an ex-smoker & has mild obstructive/ mod restrictive lung dis (obesity), hx hypoxemic resp failure on Home O2 at 3L/min continuous, OSA but she is non-compliant w/ CPAP;  Meds: O2 at 3L/min, Advair250Bid, NEBS w/ Xopenex0.63- just using prn & asked to take it Tid, ProairHFA-prn, Mucinex600Bid & asked to incr to Qid; she is on Keflex now for UTI & just finished Pred taper post hosp... Breathing is stabel, in wheelchair, c/o DOE w/ ADLs x yrs and no real change;  Chest exam is clear w/ distant BS bilat;  Cor w/ Gr1/6 sys murmur no rubs or gallops...    We reviewed prob list, meds, xrays and labs> see below for updates >>   CXR 12/15 showed heart at upper limits, overinflated, no edema or focal airsp dis, NAD...   LABS 12/15 reviewed => Chems- BS up & down (in [place] 549=>190) w/ A1c=6.5, Cr=1.4=>1.1;  CBC- Hg=7.6=>10.6 after 2u...  PLAN>> we decided to continue same meds for her multifactorial dyspnea; she is unable to exercise substantially due to fall risk & I suggested walking pushing her WC; continue regular f/u w/ her other physicians to rx her other factors like diastolicCHF, anemia, narcotics, psychotropics, sedentary lifestyle...   ~  March 29, 2015:  7 month Lafayette has continued to have problems w/ her anemia- managed by DrGutierrez her PCP and GI- DrDBrodie, notes reviewed; she was [facility] 6/30 - 02/28/15 by [facility] due to her anemia and their DC Summary is reviewed...  From the pulmonary standpoint she has remained stable on Home O2, NEBS w/ Xopenex ("I do it at least once per day"), Advair250Bid, Mucinex- taking 1Bid, ProairHFA (using 1-2 times daily she says);   She remains sedentary & not exercising as requested in the past;  She is an ex-smoker & has mild obstructive/ mod restrictive lung dis (obesity), hx hypoxemic resp failure on Home O2 at 3L/min continuous, OSA but she is non-compliant w/  CPAP...     We reviewed prob list, meds, xrays and labs>>   CXR 02/23/15 showed mild cardiomegaly, stable scarring in the lung bases, NAD.Marland KitchenMarland Kitchen             Problem List:  COPD (ICD-496) & BRONCHITIS, RECURRENT (ICD-491.9) - Ex-smoker w/ underlying COPD on home O2, ADVAIR 250Bid, ALBUTEROL (via nebs or MDI for Prn use), & MUCINEX 1-2Bid... she has combined obstructive AND restrictive disease (based on her obesity) w/ diet (weight reduction) + exercise strongly recommended to the pt...  ~  CTChest 5/06 = neg. ~  baseline CXR w/ bilat LL scarring, NAD.... last CXR 3/09 = unchanged, NAD. ~  PFTs 3/09 showed FVC= 2.47 (83%), FEV1= 1.65 (69%), %1sec= 67, mid-flows= 28%pred. ~  CXR 4/12 showed bilat scarring, NAD... ~  PFT 10/12 showed FVC= 2.28 (77%), FEV1= 1.46 (64%), %1sec= 64, mid-flows= 31% pred. ~  CXR 3/13 showed normal heart size, prom hilar regions, no infiltrates etc, NAD.[name]. ~  CTAngio Chest 3/13 showed neg for PE- scarring at lung bases, no adenopathy or lung lesions, calcified coronaries, multilevel spondylosis in TSpine, cirrhosis in liver ~  CXR 2/14 showed cardiomeg, no pulm edema, clear lungs, NAD ~  PFT 5/14 showed FVC=2.41 (83%), FEV1=1.77 (80%), %1sec=74, mid-flows= 61% predicted (all c/w min obstructive dis, +superimposed restriction) ~  Ambulatory O2 sats 5/14 showed 93% sat on RA at rest w/ HR=60; 87% sat after one lap on RA w/ HR=62 ~  7/14: on Home O2, Advair250, NEBS, Mucinex; remote smoker quit yrs ago; known obstructive & restrictive lung dis; CXR 6/14 showed mild cardiomeg, mild vasc congestion, linear atx at left base, otherw clear; CTA 3/13 was neg for PE & showed some atelec, coronary calcif, multilevel spondylosis, & cirrhosis of the liver;  she continues on O2 by [place] at 3L/min...  ~  CXR 7/14 & 9/14 showed norm heart size, clear lungs, no edema etc..  ~  2/15: on Home O2, Advair250, NEBS, Mucinex; remote smoker quit yrs ago; known obstructive & restrictive lung dis; CXR 2/15 showed mild cardiomeg, mild vasc congestion, left mid lung scarring & peribronch thickening, otherw clear/NAD; CTA 3/13 was neg for PE & showed some atelec, coronary calcif, multilevel spondylosis, & cirrhosis of the liver; she continues on O2 by [place] at 3L/min...  ~  CXR 4/15 & 5/15 by Cards queried a nodular appearance to right hilum but f/u film showed this was wnl; scarring & atx at bases, healing right 7th rib fx... ~  [facility] 8/15 w/ UTI, fall & fx sternum & right ribs> transferred to NHP/ rehab, then home... ~  CXR 7/28 - 03/28/14 showed mid sternal fx, right lat rib fx, right perihilar opac- ?consolidation, mild interstitial edema, cardiomegaly, prob pulm arterial HTN...  ~  CT Chest 03/25/14 showed sternal fx w/ sm hematoma, acute fx of right lat 9th rib, plus additional old rib fxs, patchy atx bilat, cirrhotic liver & enlarged spleen ~  [facility] 12/15 w/ dyspnea- multifactorial & improved w/ antibiotics, Pred, transfused, etc... ~  CXR 12/15 showed heart at upper limits, overinflated, no edema or focal airsp dis, NAD.[name]. ~  CXR 6/16 showed mild cardiomegaly, stable scarring in the lung bases, NAD   FOLLOWED by DrCooper for CARDIOLOGY >>   HYPERTENSION (ICD-401.9) >> on ATENOLOL 58m/d,  LASIX 4101md & K20/d...   CAD (ICD-414.00) >> on Corgard20-1/2, Lisinopril5, Lasix20-2Bid, K10Qod ~  cath 4/09 by DrCooper showed normLMAIN, 20% midLAD, norm CIRC, 20-30% proxRCA, EF= 50-55%... ~  CTAngio Chest  3/13 showed calcif coronaries as noted... ~  DrCooper 7/13> f/u 2DEcho w/ mild LVH, norm LVF w/ EF=55-60% & norm wall motion, mils AS & AI, Gr2DD.Marland KitchenMarland Kitchen  PALPITATIONS (QZR-007.6) >>  DIASTOLIC CHF >>  ~  eval by DrKlein w/ 7 beats WCT on monitor... he changed Lisinopril to  BBlocker (currently ATENOLOL 10m/d) and improved... ~  she saw DrCopper for Cards f/u 1/11- palpit well controlled on Atenolol; hx non-obstructive CAD & good LVF; no change in Rx. ~  [facility] Di[facility] and Lasix adjusted- Disch on Lasix20-3/d  AORTIC STENOSIS (ICD-424.1) - she has mild Ao Valve disease w/ AS/ AI... followed by DrCooper. ~  2DEcho 11/06 showed mild ca++ AoV w/ mild AS, norm LVF w/ EF=55-65% ~  repeat 2DEcho 1/09 showed similar mild ca++ AoV w/ mild AS, mild AI, no regional wall motion abn, norm LVf w/ EF= 60%... NOTE: est PAsys= 35... ~  repeat 2DEcho 1/10 showed norm LVF w/ EF= 55-60%, no regional wall motion abn, mild DD, mod calcif AoV & mild reduced leaflet excursion & mild AI... ~  Repeat 2DEcho 7/13 showed norm LVF w/ EF=55-65%, no regional wall motion abn, Gr2DD, mildAS, mildAI, mild LAdil... ~  DrCooper 7/13> stable, no angina, too sedentary; f/u 2DEcho w/ mild LVH, norm LVF w/ EF=55-60% & norm wall motion, mils AS & AI, Gr2DD... ~  7/14: known non-obstructive CAD; hx atypCP/ CWP; no angina & she remains way too sedentary; discussed risk factor reduction strategy & she saw DrCooper 5/14> stable, no angina, too sedentary; last 2DEcho 7/13 w/ mild LVH, norm LVF w/ EF=55-60% & norm wall motion, mild AS&AI, Gr2DD;  EKG 5/14 w/ NSR, rate62, 1st degree AVB, NSSTTWA... ~  2DEcho 8/15 showed norm LV size & function w/ EF=55-60%, no regional wall motion abn, mildly thickened AV leaflets, mild AS & Ai, calcif of mitral annulus, mod LA dil   FOLLOWED by DrGutierez & DrEllison for PRIMARY CARE >>   HYPERCHOLESTEROLEMIA (ICD-272.0) - on SIMVASTATIN 411md...  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  ~  EGD  2/06 showed angiodysplasia & mult gastric pseudopolyps, watermelon stomach improved from 2003. ~  recent f/u EGD 9/09 showed chr gastritis, no watermelon stomach & improved from prev... ~  recurrent anemia 12/10 w/ f/u EGD 2/11 showing gastric antral avm's- s/p argon laser ablation, no varicies etc... 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 ~  5/13:  She is not on PPI and c/o some abd discomfort- rec starting PROTONIX 4059m... ~  She saw DrDBrodie 10/13 w/ c/o dysphagia + f/u of her autoimmune liver dis, portal HTN, cirrhosis, "watermelon stomach" & hx argon laser ablation of gastric AVMs (chr GI blood loss w/ periodic Fe infusions- last 8/13); EGD 10/13 showed norm esoph mucosa, no varicies, spasm at the LES but no stricture found; dilated to 68F; mod severe portal hypertensive gastropathy in stomach, no active bleeding; REC> continue PPI, try Levsin... ~  1/14:  She had EGD & Argon Laser ablation of  gastric AVM by DrDBrodie... 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 ~  9/14:  She was Hosp by [facility] & seen by GI- drPyrtle w/ EGD (no varices, smHH, mild gastropathy, area of GAVE treated w/ APC, nodular mucosa (inflammed mucosa, neg HPylori, no dysplasia)...  DIVERTICULOSIS OF COLON (ICD-562.10) & POLYP, COLON (ICD-211.3) -  ~  last colonoscopy 10/08 w/ adenomatous polyp removed, and angiodysplasia without hemorrhage ~  CT Abd 10/08 showed Cirrhosis, borderline splenomeg, sl pancreatic atrophy & duct dil, sm umbil hernia, DJD sp, NAD... ~  10/13: she had f/u drDBrodie> last colonoscopy was  2008, she felt pt was too high risk for prep & sedation for a f/u colonoscopy & they decided to hold off...  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 ~  10/11:  presents w/ dysuria & UTI Rx w/ Cipro... ~  12/11:  She had f/u DrGrapey... ~  6/14: she was treated for a sens EColi UTI during 6/14 Hosp w/ Cipro...  OSTEOARTHRITIS (ICD-715.90)  FIBROMYALGIA (ICD-729.1) - she persists w/ mult somatic complaints...  ? of OSTEOPOROSIS (ICD-733.00) - she had normal BMD in 2001 at [place]..  RESTLESS LEGS SYNDROME (ICD-333.94) - she prev weaned off the Requip & remains on SINEMET-CR 25-100Bid... ~  9/10: now c/o recurrent restless leg symptoms... restart REQUIP 513m & titrate up. ~  12/10: persistant symptoms on 2613mhs... rec to incr to 13m39m. ~  2/11: she reports resolution of RLS on 13mg43mquip Qhs, resting well now. ~  12/11:  Recurrent RLS symptoms despite the Requip & seen by DrDohmeier w/ addition of Sinemet 25/100 Bid... ~  2012:  Stable on Requip13mg 36minemet 25/100 Bid... ~  7/13:  She is once again c/o incr RLS symptoms & rec to restart REQUIP 13mg/d83m  ~  8/13: she had f/u visit w/ DrDohmeier> RLS, tremors, & insomnia; Seroquel helped for awhile, she is working w/ DrHejaImmunologistlp her problems... ~  7/14: on Requip4Qhs & Sinemet25/100Bid from DrDohmeier (RLS, Trmeor, Gait abn) w/ fair control of symptoms- her note is reviewed & they stopped the Sinemet...  DYSTHYMIA (ICD-300.4) - Hx depression w/ psychotic features and hosp  [facility]> she was prev treated by Dr. Amy SiViann [place]field for counselling...  Adm to [facility] in May09 by DrBarbSmith w/ depression and meds adjusted (see DC summary)...  Now followed by DrHejaJobe GibbonellyVirgil [facility] and meds changed to RISPERDAL, PRISTIQUE, BUPROPION, SEROQUEL, XANAX & Nuvigil (Armodafinil)...  husb indicates that she is much better at present & continues outpt Rx... ~  MRI/ MRA Brain 2/08 showed sm vessel dis & atherosclerotic changes in post circ... ~  9/10: pt indicates that she is off the Trazodone... DrHejazi added Wellbutrin ?for her RLS? ~  2011:  pt  continues regular f/u w/ Psychiatry on numerous meds w/ freq med adjustments; she is requested to get notes from [facility] to our attention & bring all med bottles to her OVs for review... ~  2012:  We have not received any notes from [facility]; pt has not complied w/ our request for med bottles to be brought to each OV... ~  2013:  We still don't have accurate list of her current meds as we have not received [facility] notes & she NEVER brings meds to office despite our requests... ~  7/14: on RX per Psychiatry DrHejazi (we don't have notes from him) & she does not know her meds, didn't bring med bottles or med list!  As best we can tell she is on Nuvigil (on hold), Pristiq100, WellbutrinXL150, Seroquel100-4Qhs, Xanax1mg  prn- all from Psychiatry & she is reminded to bring all bottles to every visit...   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    Past Surgical History  Procedure Laterality Date  . Hemorroidectomy    . Appendectomy    . Vaginal hysterectomy    . Breast biopsy      bilateral  . Cesarean section      x 3    . Appendectomy    . Esophagogastroduodenoscopy  06/12/2012    Procedure: ESOPHAGOGASTRODUODENOSCOPY (EGD);  Surgeon: [place] Dragon, MD;  Location[facility];  Service: Endoscopy;  Laterality: N/A;  . Balloon dilation  06/12/2012    Procedure: BALLOON DILATION;  Surgeon: [place] Dragon, MD;  Location: [facility];  Service: Endoscopy;  Laterality: N/A;  ?balloon  . Esophagogastroduodenoscopy  09/10/2012    Procedure: ESOPHAGOGASTRODUODENOSCOPY (EGD);  Surgeon: [place] Dragon, MD;  Location: [facility];  Service: Endoscopy;  Laterality: N/A;  . Hot hemostasis  09/10/2012    Procedure: HOT HEMOSTASIS (ARGON PLASMA COAGULATION/BICAP);  Surgeon: [place] Dragon, MD;  Location: [facility];  Service: Endoscopy;  Laterality: N/A;  . Esophagogastroduodenoscopy N/A 01/18/2013    Procedure: ESOPHAGOGASTRODUODENOSCOPY (EGD);  Surgeon: [place] Dragon, MD;  Location: [facility];  Service: Endoscopy;  Laterality: N/A;  . Esophagogastroduodenoscopy N/A 05/24/2013    Procedure: ESOPHAGOGASTRODUODENOSCOPY (EGD);  Surgeon: Jerene Bears, MD;  Location: [place];  Service: Gastroenterology;  Laterality: N/A;  . Hot hemostasis N/A 05/24/2013    Procedure: HOT HEMOSTASIS (ARGON PLASMA COAGULATION/BICAP);  Surgeon: Jerene Bears, MD;  Location: [place];  Service: Gastroenterology;  Laterality: N/A;  . US echocardiography  07/2014    mild LVH, EF 60-65%, normal wall motion, mild AR, mod dilated LA, mildly dilated RA, peak PA pressure 60mmHg  . Esophagogastroduodenoscopy N/A 10/20/2014    Procedure: ESOPHAGOGASTRODUODENOSCOPY (EGD);  Surgeon: [place] Dragon, MD;  Location: [facility];  Service: Endoscopy;  Laterality: N/A;  . Hot hemostasis N/A 10/20/2014    Procedure: HOT HEMOSTASIS (ARGON PLASMA COAGULATION/BICAP);  Surgeon: [place] Dragon, MD;  Location: [facility];  Service: Endoscopy;  Laterality: N/A;  . Enteroscopy N/A 02/25/2015    Procedure: ENTEROSCOPY;  Surgeon: [name] Ada, MD;  Location: [place];  Service: Endoscopy;  Laterality: N/A;    Outpatient Encounter Prescriptions as of 03/29/2015  Medication Sig  . ADVAIR DISKUS 250-50 MCG/DOSE AEPB INHALE 1 PUFF INTO THE LUNGS 2 (TWO) TIMES DAILY.  [name] albuterol (PROAIR HFA) 108 (90 BASE) MCG/ACT inhaler Inhale 2 puffs into the lungs every  6 (six) hours as needed for wheezing or shortness of breath.   . ALPRAZolam (XANAX) 1 MG tablet Take 1 tablet (1 mg total) by mouth every 8 (eight) hours as needed. for anxiety  . b complex vitamins tablet Take 1 tablet by mouth daily.   [name] buPROPion (WELLBUTRIN XL) 150 MG 24 hr tablet Take 1 tablet (150 mg total) by mouth daily.  . darbepoetin (ARANESP) 200 MCG/0.4ML SOLN injection Inject 200 mcg into the skin every 14 (fourteen) days.  . furosemide (LASIX) 40 MG tablet Take 1 tablet (40 mg total) by mouth 2 (two) times daily.  [name] guaiFENesin (MUCINEX) 600 MG 12 hr tablet Take 600 mg by mouth 2 (two) times daily. 2 tablet by mouth once daily  . Insulin Glargine (LANTUS SOLOSTAR) 100 UNIT/ML Solostar Pen Inject 140 Units into the skin every morning.  . insulin lispro (HUMALOG) 100 UNIT/ML KiwkPen INJECT 5-30 UNITS INTO THE SKIN AT 4PM PER SLIDING SCALE (5 for every 50 over 150)  . lactulose, encephalopathy, (CHRONULAC) 10 GM/15ML SOLN Take 30 mLs (20 g total) by mouth 2 (two) times daily. constipation (Patient taking differently: Take 20 g by mouth daily as needed (constipation). constipation)  . levalbuterol (XOPENEX) 0.63 MG/3ML nebulizer solution Take 0.63 mg by nebulization 3 (three) times daily.   [name] loratadine (CLARITIN) 10 MG tablet TAKE 1 TABLET EVERY DAY  . Multiple Vitamins-Minerals (CENTRUM SILVER PO) Take 1 tablet by mouth daily.   . nadolol (CORGARD) 20 MG tablet TAKE 1/2 TABLET BY MOUTH DAILY  . oxyCODONE (OXY IR/ROXICODONE) 5 MG immediate release tablet Take 1 tablet (5 mg total) by mouth every 6 (six) hours as needed for severe pain.  . pantoprazole (PROTONIX) 40 MG tablet Take 1 tablet  (40 mg total) by mouth 2 (two) times daily.  [name] PRISTIQ 50 MG 24 hr tablet TAKE 3 TABLETS BY MOUTH EVERY DAY  . promethazine (PHENERGAN) 25 MG tablet Take 1 tablet (25 mg total) by mouth 2 (two) times daily as needed for nausea.  [name] QUEtiapine (SEROQUEL) 200 MG tablet Take 1 tablet (200 mg total) by mouth at bedtime.  . rifaximin (XIFAXAN) 550 MG TABS tablet Take 1 tablet (550 mg total) by mouth 2 (two) times daily.  [name] rOPINIRole (REQUIP) 4 MG tablet Take 1 tablet (4 mg total) by mouth at bedtime.  . Saline (OCEAN NASAL SPRAY NA) Place 1 spray into the nose 3 (three) times daily as needed (congestion).   . simvastatin (ZOCOR) 40 MG tablet TAKE 1 TABLET BY MOUTH EVERY EVENING  . spironolactone (ALDACTONE) 25 MG tablet TAKE 1/2 TABLET BY MOUTH DAILY  . Vitamin D, Ergocalciferol, (DRISDOL) 50000 UNITS CAPS capsule TAKE 1 CAPSULE (50,000 UNITS TOTAL) BY MOUTH EVERY 7 (SEVEN) DAYS. TAKE ON FRIDAY   No facility-administered encounter medications on file as of 03/29/2015.    Allergies  Allergen Reactions  . Acetaminophen Other (See Comments)    Liver dysfunction  . Aspirin Other (See Comments)    Causes nosebleeds  . Theophylline Nausea And Vomiting  . Penicillins Itching    Current Medications, Allergies, Past Medical History, Past Surgical History, Family History, and Social History were reviewed in [employer] record.    Review of Systems         See HPI - all other systems neg except as noted...      The patient complains of weight gain, dyspnea on exertion, muscle weakness, difficulty walking, and depression.  The patient denies anorexia, fever, weight  loss, vision loss, decreased hearing, hoarseness, chest pain, syncope, peripheral edema, prolonged cough, headaches, hemoptysis, abdominal pain, melena, hematochezia, severe indigestion/heartburn, hematuria, incontinence, suspicious skin lesions, transient blindness, unusual weight change, abnormal bleeding, enlarged  lymph nodes, and angioedema.     Objective:   Physical Exam     WD, Obese, 74 y/o WF in NAD... she is chr ill appearing... GENERAL:  Alert & oriented x 3... HEENT:  [place]/AT, pale conjuct, EOM-wnl, PERRLA, EACs-clear, TMs-wnl, NOSE-clear, THROAT-clear w/ dry MMs... NECK:  Supple w/ fairROM; no JVD; normal carotid impulses w/o bruits; no thyromegaly or nodules palpated; no lymphadenopathy. CHEST:  Clear to P & A; decr BS bilat without wheezes/ rales/ or rhonchi heard... HEART:  Regular Rhythm;  gr 1/6 SEM without rubs or gallops... ABDOMEN:  Obese, soft & nontender w/ panniculus; normal bowel sounds; no organomegaly or masses detected. EXT: without deformities, mod arthritic changes; no varicose veins/ +venous insuffic/ tr edema. NEURO:  CN's intact;  no focal neuro deficits... DERM:  No lesions noted; no rash, pale complexion.  RADIOLOGY DATA:  Reviewed in the EPIC EMR & discussed w/ the patient...  LABORATORY DATA:  Reviewed in the EPIC EMR & discussed w/ the patient...   Assessment & Plan:    DYSPNEA> clearly this is multifactorial w/ components from Lung dis, CHF, Obesity, inactivity, Anemia, etc; a large component is likely related to anxiety & chest "tightness" for which she has Xanax 73m from her psychiatrists...  COPD>  Combined mild obstructive & mod restrictive lung dis; PFT showed sl deterioration in FEV1; multifactorial dyspnea including stress; REC> continue Advair, NEBS, O2, Mucinex, etc; get on diet/ exercise/ get weight down; continue [facility]che f/u for help w/ anxiety...  HBP>  Controlled on low dose BBlocker plus diuretic> continue same & work on weight reduction...  CAD, DiastolicCHF>  Known nonobstructive dis, way too sedentary> we discussed diet + exercise...  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  CHOL>  Stable on the Simva40...  DM>  Managed by DrEllison on Lantus insulin currently...  OBESITY>   Reviewed diet + exercise needed...  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  Autoimmune hepatitis>  Also managed by DrBrodie, off the Immuran at present... She also has pancytopenia related to her cirrhosis.  RLS>  Notes from DrDohmeier reviewed> pt is off Sinemet now...  Dysthymia>  followed by Psyche on mult meds...  Anemia>  [facility]DBrodie for GI, DrGutierrez for Primary..   Patient's Medications  New Prescriptions   No medications on file  Previous Medications   ADVAIR DISKUS 250-50 MCG/DOSE AEPB    INHALE 1 PUFF INTO THE LUNGS 2 (TWO) TIMES DAILY.   ALBUTEROL (PROAIR HFA) 108 (90 BASE) MCG/ACT INHALER    Inhale 2 puffs into the lungs every 6 (six) hours as needed for wheezing or shortness of breath.    ALPRAZOLAM (XANAX) 1 MG TABLET    Take 1 tablet (1 mg total) by mouth every 8 (eight) hours as needed. for anxiety   B COMPLEX VITAMINS TABLET    Take 1 tablet by mouth daily.    BUPROPION (WELLBUTRIN XL) 150 MG 24 HR TABLET    Take 1 tablet (150 mg total) by mouth daily.   DARBEPOETIN (ARANESP) 200 MCG/0.4ML SOLN INJECTION    Inject 200 mcg into the skin every 14 (fourteen) days.   FUROSEMIDE (  LASIX) 40 MG TABLET    Take 1 tablet (40 mg total) by mouth 2 (two) times daily.   GUAIFENESIN (MUCINEX) 600 MG 12 HR TABLET    Take 600 mg by mouth 2 (two) times daily. 2 tablet by mouth once daily   INSULIN GLARGINE (LANTUS SOLOSTAR) 100 UNIT/ML SOLOSTAR PEN    Inject 140 Units into the skin every morning.   INSULIN LISPRO (HUMALOG) 100 UNIT/ML KIWKPEN    INJECT 5-30 UNITS INTO THE SKIN AT 4PM PER SLIDING SCALE (5 for every 50 over 150)   LACTULOSE, ENCEPHALOPATHY, (CHRONULAC) 10 GM/15ML SOLN    Take 30 mLs (20 g total) by mouth 2 (two) times daily. constipation   LEVALBUTEROL (XOPENEX) 0.63 MG/3ML NEBULIZER SOLUTION    Take 0.63 mg by nebulization 3 (three) times daily.    LORATADINE (CLARITIN) 10  MG TABLET    TAKE 1 TABLET EVERY DAY   MULTIPLE VITAMINS-MINERALS (CENTRUM SILVER PO)    Take 1 tablet by mouth daily.    NADOLOL (CORGARD) 20 MG TABLET    TAKE 1/2 TABLET BY MOUTH DAILY   OXYCODONE (OXY IR/ROXICODONE) 5 MG IMMEDIATE RELEASE TABLET    Take 1 tablet (5 mg total) by mouth every 6 (six) hours as needed for severe pain.   PANTOPRAZOLE (PROTONIX) 40 MG TABLET    Take 1 tablet (40 mg total) by mouth 2 (two) times daily.   PRISTIQ 50 MG 24 HR TABLET    TAKE 3 TABLETS BY MOUTH EVERY DAY   PROMETHAZINE (PHENERGAN) 25 MG TABLET    Take 1 tablet (25 mg total) by mouth 2 (two) times daily as needed for nausea.   QUETIAPINE (SEROQUEL) 200 MG TABLET    Take 1 tablet (200 mg total) by mouth at bedtime.   RIFAXIMIN (XIFAXAN) 550 MG TABS TABLET    Take 1 tablet (550 mg total) by mouth 2 (two) times daily.   ROPINIROLE (REQUIP) 4 MG TABLET    Take 1 tablet (4 mg total) by mouth at bedtime.   SALINE (OCEAN NASAL SPRAY NA)    Place 1 spray into the nose 3 (three) times daily as needed (congestion).    SIMVASTATIN (ZOCOR) 40 MG TABLET    TAKE 1 TABLET BY MOUTH EVERY EVENING   SPIRONOLACTONE (ALDACTONE) 25 MG TABLET    TAKE 1/2 TABLET BY MOUTH DAILY   VITAMIN D, ERGOCALCIFEROL, (DRISDOL) 50000 UNITS CAPS CAPSULE    TAKE 1 CAPSULE (50,000 UNITS TOTAL) BY MOUTH EVERY 7 (SEVEN) DAYS. TAKE ON FRIDAY  Modified Medications   No medications on file  Discontinued Medications   No medications on file

## 2015-03-29 NOTE — Patient Instructions (Signed)
Today we updated your med list in our EPIC system...    Continue your current medications the same...  Call for any questions...  Let's plan a follow up visit in 6mo, sooner if needed for problems...   

## 2015-03-30 ENCOUNTER — Telehealth: Payer: Self-pay

## 2015-03-30 DIAGNOSIS — D61818 Other pancytopenia: Secondary | ICD-10-CM | POA: Diagnosis not present

## 2015-03-30 DIAGNOSIS — K7581 Nonalcoholic steatohepatitis (NASH): Secondary | ICD-10-CM | POA: Diagnosis not present

## 2015-03-30 DIAGNOSIS — F419 Anxiety disorder, unspecified: Secondary | ICD-10-CM | POA: Diagnosis not present

## 2015-03-30 DIAGNOSIS — J441 Chronic obstructive pulmonary disease with (acute) exacerbation: Secondary | ICD-10-CM | POA: Diagnosis not present

## 2015-03-30 DIAGNOSIS — I509 Heart failure, unspecified: Secondary | ICD-10-CM | POA: Diagnosis not present

## 2015-03-30 DIAGNOSIS — N390 Urinary tract infection, site not specified: Secondary | ICD-10-CM | POA: Diagnosis not present

## 2015-03-30 DIAGNOSIS — D649 Anemia, unspecified: Secondary | ICD-10-CM | POA: Diagnosis not present

## 2015-03-30 DIAGNOSIS — E119 Type 2 diabetes mellitus without complications: Secondary | ICD-10-CM | POA: Diagnosis not present

## 2015-03-30 NOTE — Telephone Encounter (Signed)
plz ok verbal for this.

## 2015-03-30 NOTE — Telephone Encounter (Signed)
Marlowe Kays OT with [facility] left v/m requesting verbal order for home health OT 2 x a week for 4 weeks for instruction with a breather exercise device to help strengthen her breathing muscles. Connie request cb .

## 2015-03-30 NOTE — Telephone Encounter (Signed)
Message left advising Connie 

## 2015-03-31 ENCOUNTER — Encounter (HOSPITAL_COMMUNITY)
Admission: RE | Admit: 2015-03-31 | Discharge: 2015-03-31 | Disposition: A | Discharge status: Home / Self Care | Payer: Medicare Other | Source: Ambulatory Visit | Attending: Family Medicine | Admitting: Family Medicine

## 2015-03-31 ENCOUNTER — Encounter: Payer: Self-pay | Admitting: Family Medicine

## 2015-03-31 ENCOUNTER — Ambulatory Visit (INDEPENDENT_AMBULATORY_CARE_PROVIDER_SITE_OTHER): Payer: Medicare Other | Admitting: Family Medicine

## 2015-03-31 VITALS — BP 112/64 | HR 52 | Temp 98.1°F | Wt 214.5 lb

## 2015-03-31 DIAGNOSIS — K2100 Gastro-esophageal reflux disease with esophagitis, without bleeding: Secondary | ICD-10-CM

## 2015-03-31 DIAGNOSIS — K31819 Angiodysplasia of stomach and duodenum without bleeding: Secondary | ICD-10-CM | POA: Diagnosis not present

## 2015-03-31 DIAGNOSIS — N189 Chronic kidney disease, unspecified: Secondary | ICD-10-CM | POA: Insufficient documentation

## 2015-03-31 DIAGNOSIS — D500 Iron deficiency anemia secondary to blood loss (chronic): Secondary | ICD-10-CM | POA: Diagnosis not present

## 2015-03-31 LAB — POCT HEMOGLOBIN-HEMACUE: Hemoglobin: 9.2 g/dL — ABNORMAL LOW (ref 12.0–15.0)

## 2015-03-31 MED ORDER — DARBEPOETIN ALFA 200 MCG/0.4ML IJ SOSY
PREFILLED_SYRINGE | INTRAMUSCULAR | Status: AC
  Filled 2015-03-31: qty 0.4

## 2015-03-31 MED ORDER — DARBEPOETIN ALFA 200 MCG/0.4ML IJ SOSY
200.0000 ug | PREFILLED_SYRINGE | INTRAMUSCULAR | Status: DC
  Administered 2015-03-31: 200 ug via SUBCUTANEOUS

## 2015-03-31 NOTE — Assessment & Plan Note (Signed)
S/p APC last month, pending rpt EGD and possible rpt APC by GI later this month. sxs seem stable at the moment.  Need to monitor Hgb closely, consider regular standing orders for Hgb Q2 wks regardless of aranesp injection.

## 2015-03-31 NOTE — Progress Notes (Signed)
Pre visit review using our clinic review tool, if applicable. No additional management support is needed unless otherwise documented below in the visit note. 

## 2015-03-31 NOTE — Patient Instructions (Addendum)
You are doing well today. Continue current medicines.  Return as needed or in 1 month for follow up.

## 2015-03-31 NOTE — Progress Notes (Signed)
BP 112/64 mmHg  Pulse 52  Temp(Src) 98.1 F (36.7 C) (Oral)  Wt 214 lb 8 oz (97.297 kg)   CC: hosp f/u visit  Subjective:    Patient ID: [name], female    DOB: 02/25/1941, 74 y.o.   MRN: [id number]  HPI: [name] is a 74 y.o. female presenting on 03/31/2015 for Follow-up   Complicated patient presents for hosp and nursing home f/u. See below for recent hospitalization details. PMH of GAVE, NASH, liver cirrhosis, diastolic heart failure, portal hypertension, COPD on 3 L of oxygen chronically at home, aortic stenosis, CKD-III. Records reviewed (hospital and nursing home).  Since she's been home, denies evident blood in stool or bowel changes. Some red stools while in [facility] but not recently. Attributed to hemorrhoids. Chronic baseline dyspnea worse with exertion. Stays tired. Some chest burning yesterday despite bid protonix. Not interested in further medication for this.  Set up with [facility] - PT, OT. Continues involved with [facility] care management. Deloria Lair NP continues coming out to the house.   Has upcoming appt with Kayleen Memos at home, as well as with Dr Loanne Drilling and Dr Olevia Perches for EGD.   Admit date: 02/23/2015 Discharge date: 02/28/2015 Discharged to [facility] for short term rehab, back home on 03/18/2015.  Recommendations for Outpatient Follow-up:  Follow low sodium diet Check weight on daily basis Repeat CBC in 5 days to follow Hgb trend Maintain adequate hydration  Follow up with PCP in 2 weeks Will need outpatient follow up with Dr. 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 Interim medical history since our last visit reviewed. Allergies and medications reviewed and updated. Current Outpatient Prescriptions on File Prior to Visit  Medication Sig  . ADVAIR DISKUS 250-50 MCG/DOSE AEPB INHALE 1 PUFF INTO THE LUNGS 2 (TWO) TIMES DAILY.  [name] albuterol (PROAIR HFA) 108 (90 BASE) MCG/ACT inhaler Inhale 2 puffs into the lungs every 6 (six) hours as needed for wheezing or shortness of breath.   . ALPRAZolam (XANAX) 1 MG tablet Take 1 tablet (1 mg total) by mouth every 8 (eight) hours as needed. for anxiety  . b complex vitamins tablet Take 1 tablet by mouth daily.   [name] buPROPion (WELLBUTRIN XL) 150 MG 24 hr tablet Take 1 tablet (150 mg total) by mouth daily.  . darbepoetin (ARANESP) 200 MCG/0.4ML SOLN injection Inject 200 mcg into the skin every 14 (fourteen) days.  . furosemide (LASIX) 40 MG tablet Take 1 tablet (40 mg total) by mouth 2 (two) times daily.  [name] guaiFENesin (MUCINEX) 600 MG 12 hr tablet Take 600 mg by mouth  2 (two) times daily. 2 tablet by mouth once daily  . Insulin Glargine (LANTUS SOLOSTAR) 100 UNIT/ML Solostar Pen Inject 140 Units into the skin every morning.  . insulin lispro (HUMALOG) 100 UNIT/ML KiwkPen INJECT 5-30 UNITS INTO THE SKIN AT 4PM PER SLIDING SCALE (5 for every 50 over 150)  . lactulose,  encephalopathy, (CHRONULAC) 10 GM/15ML SOLN Take 30 mLs (20 g total) by mouth 2 (two) times daily. constipation (Patient taking differently: Take 20 g by mouth daily as needed (constipation). constipation)  . levalbuterol (XOPENEX) 0.63 MG/3ML nebulizer solution Take 0.63 mg by nebulization 3 (three) times daily.   [name] loratadine (CLARITIN) 10 MG tablet TAKE 1 TABLET EVERY DAY  . Multiple Vitamins-Minerals (CENTRUM SILVER PO) Take 1 tablet by mouth daily.   . nadolol (CORGARD) 20 MG tablet TAKE 1/2 TABLET BY MOUTH DAILY  . oxyCODONE (OXY IR/ROXICODONE) 5 MG immediate release tablet Take 1 tablet (5 mg total) by mouth every 6 (six) hours as needed for severe pain.  . pantoprazole (PROTONIX) 40 MG tablet Take 1 tablet (40 mg total) by mouth 2 (two) times daily.  [name] PRISTIQ 50 MG 24 hr tablet TAKE 3 TABLETS BY MOUTH EVERY DAY  . promethazine (PHENERGAN) 25 MG tablet Take 1 tablet (25 mg total) by mouth 2 (two) times daily as needed for nausea.  [name] QUEtiapine (SEROQUEL) 200 MG tablet Take 1 tablet (200 mg total) by mouth at bedtime.  . rifaximin (XIFAXAN) 550 MG TABS tablet Take 1 tablet (550 mg total) by mouth 2 (two) times daily.  [name] rOPINIRole (REQUIP) 4 MG tablet Take 1 tablet (4 mg total) by mouth at bedtime.  . Saline (OCEAN NASAL SPRAY NA) Place 1 spray into the nose 3 (three) times daily as needed (congestion).   . simvastatin (ZOCOR) 40 MG tablet TAKE 1 TABLET BY MOUTH EVERY EVENING  . spironolactone (ALDACTONE) 25 MG tablet TAKE 1/2 TABLET BY MOUTH DAILY  . Vitamin D, Ergocalciferol, (DRISDOL) 50000 UNITS CAPS capsule TAKE 1 CAPSULE (50,000 UNITS TOTAL) BY MOUTH EVERY 7 (SEVEN) DAYS. TAKE ON FRIDAY   No current facility-administered medications on file prior to visit.    Review of Systems Per HPI unless specifically indicated above     Objective:    BP 112/64 mmHg  Pulse 52  Temp(Src) 98.1 F (36.7 C) (Oral)  Wt 214 lb 8 oz (97.297 kg)  Wt Readings from Last 3 Encounters:  03/31/15  214 lb 8 oz (97.297 kg)  03/29/15 215 lb (97.523 kg)  03/02/15 222 lb 3.2 oz (100.789 kg)    Physical Exam  Constitutional: She appears well-developed and well-nourished. No distress.  3L continuous O2 by [place]  Cardiovascular: Normal rate, regular rhythm and intact distal pulses.   Murmur (3/6 SEM best at LUSB) heard. Pulmonary/Chest: Effort normal and breath sounds normal. No respiratory distress. She has no wheezes. She has no rales.  Nursing note and vitals reviewed.  Results for orders placed or performed during the hospital encounter of 03/17/15  Hemoglobin-hemacue, POC  Result Value Ref Range   Hemoglobin 9.8 (L) 12.0 - 15.0 g/dL   *Note: Due to a large number of results and/or encounters for the requested time period, some results have not been displayed. A complete set of results can be found in Results Review.   Lab Results  Component Value Date   HGBA1C 7.1* 01/26/2015       Assessment & Plan:   Problem List Items Addressed This Visit    GAVE (gastric  antral vascular ectasia) - Primary    S/p APC last month, pending rpt EGD and possible rpt APC by GI later this month. sxs seem stable at the moment.  Need to monitor Hgb closely, consider regular standing orders for Hgb Q2 wks regardless of aranesp injection.      GERD    Now on protonix 40mg  BID. Declines adding prn zantac/pepcid. Has f/u with GI.      Iron deficiency anemia due to chronic blood loss (Chronic)    S/p 2u pRBC in hospital.  Latest hgb stable at 9.8. Pt attributes prolonging aranesp injection to recent marked anemia. Consider regular Q2 wk Hgb checks.          Follow up plan: Return in about 1 month (around 05/01/2015), or as needed, for follow up visit.

## 2015-03-31 NOTE — Assessment & Plan Note (Signed)
S/p 2u pRBC in hospital.  Latest hgb stable at 9.8. Pt attributes prolonging aranesp injection to recent marked anemia. Consider regular Q2 wk Hgb checks.

## 2015-03-31 NOTE — Assessment & Plan Note (Deleted)
Completed treatment - sxs resolved. Breathing back to baseline without wheeze.

## 2015-03-31 NOTE — Assessment & Plan Note (Signed)
Now on protonix 40mg  BID. Declines adding prn zantac/pepcid. Has f/u with GI.

## 2015-04-02 ENCOUNTER — Encounter: Payer: Self-pay | Admitting: Pulmonary Disease

## 2015-04-03 ENCOUNTER — Other Ambulatory Visit: Payer: Self-pay | Admitting: *Deleted

## 2015-04-03 ENCOUNTER — Encounter: Payer: Self-pay | Admitting: *Deleted

## 2015-04-03 DIAGNOSIS — D649 Anemia, unspecified: Secondary | ICD-10-CM | POA: Diagnosis not present

## 2015-04-03 DIAGNOSIS — D61818 Other pancytopenia: Secondary | ICD-10-CM | POA: Diagnosis not present

## 2015-04-03 DIAGNOSIS — E119 Type 2 diabetes mellitus without complications: Secondary | ICD-10-CM | POA: Diagnosis not present

## 2015-04-03 DIAGNOSIS — N390 Urinary tract infection, site not specified: Secondary | ICD-10-CM | POA: Diagnosis not present

## 2015-04-03 DIAGNOSIS — J441 Chronic obstructive pulmonary disease with (acute) exacerbation: Secondary | ICD-10-CM | POA: Diagnosis not present

## 2015-04-03 DIAGNOSIS — K7581 Nonalcoholic steatohepatitis (NASH): Secondary | ICD-10-CM | POA: Diagnosis not present

## 2015-04-03 DIAGNOSIS — I509 Heart failure, unspecified: Secondary | ICD-10-CM | POA: Diagnosis not present

## 2015-04-03 DIAGNOSIS — F419 Anxiety disorder, unspecified: Secondary | ICD-10-CM | POA: Diagnosis not present

## 2015-04-04 ENCOUNTER — Telehealth: Payer: Self-pay | Admitting: Pulmonary Disease

## 2015-04-04 DIAGNOSIS — I509 Heart failure, unspecified: Secondary | ICD-10-CM | POA: Diagnosis not present

## 2015-04-04 DIAGNOSIS — E119 Type 2 diabetes mellitus without complications: Secondary | ICD-10-CM | POA: Diagnosis not present

## 2015-04-04 DIAGNOSIS — K7581 Nonalcoholic steatohepatitis (NASH): Secondary | ICD-10-CM | POA: Diagnosis not present

## 2015-04-04 DIAGNOSIS — D649 Anemia, unspecified: Secondary | ICD-10-CM | POA: Diagnosis not present

## 2015-04-04 DIAGNOSIS — D61818 Other pancytopenia: Secondary | ICD-10-CM | POA: Diagnosis not present

## 2015-04-04 DIAGNOSIS — J441 Chronic obstructive pulmonary disease with (acute) exacerbation: Secondary | ICD-10-CM | POA: Diagnosis not present

## 2015-04-04 DIAGNOSIS — N390 Urinary tract infection, site not specified: Secondary | ICD-10-CM | POA: Diagnosis not present

## 2015-04-04 DIAGNOSIS — J449 Chronic obstructive pulmonary disease, unspecified: Secondary | ICD-10-CM | POA: Diagnosis not present

## 2015-04-04 DIAGNOSIS — F419 Anxiety disorder, unspecified: Secondary | ICD-10-CM | POA: Diagnosis not present

## 2015-04-04 NOTE — Telephone Encounter (Signed)
Pt's husband requesting advair 250 samples.  We have none at this time.  Pt's husband aware.  Nothing further needed at this time.

## 2015-04-04 NOTE — Patient Outreach (Addendum)
[name]) Care Management   04/04/2015  [name] 09-04-1940 [id number]  [name] is an 74 y.o. female  Subjective: Pt has been home approximately 2 weeks after SNF stay and hospitalization for anemia. She is receiving PT/OT and is feeling better than she has in quite some time. She has seen Dr. Danise Mina and Dr. Lenna Gilford. She has appts pending with Dr. Loanne Drilling and Dr. Olevia Perches this month.  Objective:   Review of Systems  Constitutional: Negative.   HENT: Negative.   Eyes: Negative.   Respiratory: Positive for cough.   Cardiovascular: Negative.   Gastrointestinal: Negative.   Genitourinary: Negative.   Musculoskeletal: Negative.   Skin: Negative.   Neurological: Negative.   Endo/Heme/Allergies: Bruises/bleeds easily.  Psychiatric/Behavioral: Negative.    BP 126/50 mmHg  Pulse 49  Resp 18  Wt 217 lb (98.431 kg)  SpO2 98% FBS: 420  Physical Exam  Constitutional: She is oriented to person, place, and time. She appears well-developed and well-nourished.  HENT:  Head: Normocephalic.  Neck: Normal range of motion.  Cardiovascular:  Murmur heard. Bradycardic (on beta blocker)  Respiratory: Effort normal.  Diminished.  GI: Soft. Bowel sounds are normal.  Neurological: She is alert and oriented to person, place, and time.  Skin: Skin is warm and dry.  Pt has cut her great toenails down into the corners and the L great toe has some dried blood medially.  Psychiatric: She has a normal mood and affect.    Current Medications:   Current Outpatient Prescriptions  Medication Sig Dispense Refill  . ADVAIR DISKUS 250-50 MCG/DOSE AEPB INHALE 1 PUFF INTO THE LUNGS 2 (TWO) TIMES DAILY. 60 each 4  . albuterol (PROAIR HFA) 108 (90 BASE) MCG/ACT inhaler Inhale 2 puffs into the lungs every 6 (six) hours as needed for wheezing or shortness of breath.     . ALPRAZolam (XANAX) 1 MG tablet Take 1 tablet (1 mg total) by mouth every 8 (eight) hours as needed. for  anxiety 30 tablet 0  . b complex vitamins tablet Take 1 tablet by mouth daily.     [name] buPROPion (WELLBUTRIN XL) 150 MG 24 hr tablet Take 1 tablet (150 mg total) by mouth daily. 90 tablet 3  . darbepoetin (ARANESP) 200 MCG/0.4ML SOLN injection Inject 200 mcg into the skin every 14 (fourteen) days.    . furosemide (LASIX) 40 MG tablet Take 1 tablet (40 mg total) by mouth 2 (two) times daily. 30 tablet   . guaiFENesin (MUCINEX) 600 MG 12 hr tablet Take 600 mg by mouth 2 (two) times daily. 2 tablet by mouth once daily    . Insulin Glargine (LANTUS SOLOSTAR) 100 UNIT/ML Solostar Pen Inject 140 Units into the skin every morning. 20 pen 11  . insulin lispro (HUMALOG) 100 UNIT/ML KiwkPen INJECT 5-30 UNITS INTO THE SKIN AT 4PM PER SLIDING SCALE (5 for every 50 over 150)    . lactulose, encephalopathy, (CHRONULAC) 10 GM/15ML SOLN Take 30 mLs (20 g total) by mouth 2 (two) times daily. constipation (Patient taking differently: Take 20 g by mouth daily as needed (constipation). constipation) 2700 mL 6  . levalbuterol (XOPENEX) 0.63 MG/3ML nebulizer solution Take 0.63 mg by nebulization 3 (three) times daily.   5  . loratadine (CLARITIN) 10 MG tablet TAKE 1 TABLET EVERY DAY 30 tablet 6  . Multiple Vitamins-Minerals (CENTRUM SILVER PO) Take 1 tablet by mouth daily.     . nadolol (CORGARD) 20 MG tablet TAKE 1/2 TABLET BY MOUTH  DAILY 15 tablet 1  . oxyCODONE (OXY IR/ROXICODONE) 5 MG immediate release tablet Take 1 tablet (5 mg total) by mouth every 6 (six) hours as needed for severe pain. 30 tablet 0  . pantoprazole (PROTONIX) 40 MG tablet Take 1 tablet (40 mg total) by mouth 2 (two) times daily. 60 tablet 6  . PRISTIQ 50 MG 24 hr tablet TAKE 3 TABLETS BY MOUTH EVERY DAY 90 tablet 1  . QUEtiapine (SEROQUEL) 200 MG tablet Take 1 tablet (200 mg total) by mouth at bedtime. 30 tablet 6  . rifaximin (XIFAXAN) 550 MG TABS tablet Take 1 tablet (550 mg total) by mouth 2 (two) times daily.    [name] rOPINIRole (REQUIP) 4 MG  tablet Take 1 tablet (4 mg total) by mouth at bedtime. 30 tablet 11  . Saline (OCEAN NASAL SPRAY NA) Place 1 spray into the nose 3 (three) times daily as needed (congestion).     . simvastatin (ZOCOR) 40 MG tablet TAKE 1 TABLET BY MOUTH EVERY EVENING 30 tablet 3  . spironolactone (ALDACTONE) 25 MG tablet TAKE 1/2 TABLET BY MOUTH DAILY 15 tablet 1  . Vitamin D, Ergocalciferol, (DRISDOL) 50000 UNITS CAPS capsule TAKE 1 CAPSULE (50,000 UNITS TOTAL) BY MOUTH EVERY 7 (SEVEN) DAYS. TAKE ON FRIDAY 5 capsule 3  . promethazine (PHENERGAN) 25 MG tablet Take 1 tablet (25 mg total) by mouth 2 (two) times daily as needed for nausea. (Patient not taking: Reported on 04/03/2015) 30 tablet 3   No current facility-administered medications for this visit.    Functional Status:   In your present state of health, do you have any difficulty performing the following activities: 03/31/2015 02/24/2015  Hearing? N N  Vision? N N  Difficulty concentrating or making decisions? N N  Walking or climbing stairs? Y Y  Dressing or bathing? N N  Doing errands, shopping? - Y  Preparing Food and eating ? - -  Using the Toilet? - -  In the past six months, have you accidently leaked urine? - -  Do you have problems with loss of bowel control? - -  Managing your Medications? - -  Managing your Finances? - -  Housekeeping or managing your Housekeeping? - -    Fall/Depression Screening:    PHQ 2/9 Scores 04/03/2015 01/05/2015 12/19/2014 12/19/2014  PHQ - 2 Score 1 2 2 2   PHQ- 9 Score - - - 10    Assessment:  Generally improved status, controlled HF and COPD                         Poor self management of her DM - diet noncompliance                         Anemia stablized  Plan: I will see [name] again in 2 weeks.  [name], [name] CM Care Plan Problem One        Patient Outreach from 04/03/2015 in [place] Problem One  Pt high risk for readmission related to multiple health conditions   Care Plan for  Problem One  Active   [facility] CM Short Term Goal #1 (0-30 days)  pt will have no readmissions related to chronic health conditions within 30 days   [facility] CM Short Term Goal #1 Start Date  03/20/15   Interventions for Short Term Goal #1  Reinforced self management for all her chronic problems: HF, COPD, DM  [facility] CM Care Plan Problem Two        Patient Outreach from 04/03/2015 in [place] Problem Two  Diabetes   Care Plan for Problem Two  Active   [facility] Long Term Goal (31-90) days  Pt will have reduced average monthly glucose values over the next 90 days.   [facility] CM Short Term Goal #1 (0-30 days)  Pt will monitor her glucose level bid and record for the next 30 days.   [facility] CM Short Term Goal #1 Start Date  03/27/15   Interventions for Short Term Goal #2   Advised you to take Lantus every day even with a low FBS level. Reinforced low carb   [facility] CM Short Term Goal #2 (0-30 days)  Pt will follow her diabetic diet to keep her glucose levels below 200 mg/Dl 50 % of the time over the next 30 days.   Interventions for Short Term Goal #2  Reviewed glucose monitor readings and reinforced diet adherence. Try to only consume 45 Gms for meals and 15 or below for snacks.    [place] Problem Three        Patient Outreach from 04/03/2015 in [place] Problem Three  HF   Care Plan for Problem Three  Active   [facility] CM Short Term Goal #1 (0-30 days)  Pt to weigh daily and report to NP wt gain of 3-5 lbs above current wt. 209 over next 30 days.   [facility] CM Short Term Goal #1 Start Date  03/27/15   Interventions for Short Term Goal #1  Reviewed HF action plan and stoplight.     Deloria Lair GNP-BC [place] [phone]  I also trimmed pts remaining toenails and advised her to allow her great toe nails to grow out so they are not cut down into the cuticle. CCS

## 2015-04-05 DIAGNOSIS — F419 Anxiety disorder, unspecified: Secondary | ICD-10-CM | POA: Diagnosis not present

## 2015-04-05 DIAGNOSIS — D61818 Other pancytopenia: Secondary | ICD-10-CM | POA: Diagnosis not present

## 2015-04-05 DIAGNOSIS — D649 Anemia, unspecified: Secondary | ICD-10-CM | POA: Diagnosis not present

## 2015-04-05 DIAGNOSIS — K7581 Nonalcoholic steatohepatitis (NASH): Secondary | ICD-10-CM | POA: Diagnosis not present

## 2015-04-05 DIAGNOSIS — N390 Urinary tract infection, site not specified: Secondary | ICD-10-CM | POA: Diagnosis not present

## 2015-04-05 DIAGNOSIS — J441 Chronic obstructive pulmonary disease with (acute) exacerbation: Secondary | ICD-10-CM | POA: Diagnosis not present

## 2015-04-05 DIAGNOSIS — E119 Type 2 diabetes mellitus without complications: Secondary | ICD-10-CM | POA: Diagnosis not present

## 2015-04-05 DIAGNOSIS — I509 Heart failure, unspecified: Secondary | ICD-10-CM | POA: Diagnosis not present

## 2015-04-08 DIAGNOSIS — J449 Chronic obstructive pulmonary disease, unspecified: Secondary | ICD-10-CM | POA: Diagnosis not present

## 2015-04-10 ENCOUNTER — Encounter: Payer: Self-pay | Admitting: Primary Care

## 2015-04-10 ENCOUNTER — Ambulatory Visit (INDEPENDENT_AMBULATORY_CARE_PROVIDER_SITE_OTHER): Payer: Medicare Other | Admitting: Primary Care

## 2015-04-10 VITALS — BP 124/72 | HR 47 | Temp 97.8°F | Wt 219.4 lb

## 2015-04-10 DIAGNOSIS — K7581 Nonalcoholic steatohepatitis (NASH): Secondary | ICD-10-CM | POA: Diagnosis not present

## 2015-04-10 DIAGNOSIS — E119 Type 2 diabetes mellitus without complications: Secondary | ICD-10-CM | POA: Diagnosis not present

## 2015-04-10 DIAGNOSIS — F419 Anxiety disorder, unspecified: Secondary | ICD-10-CM | POA: Diagnosis not present

## 2015-04-10 DIAGNOSIS — R531 Weakness: Secondary | ICD-10-CM | POA: Diagnosis not present

## 2015-04-10 DIAGNOSIS — D61818 Other pancytopenia: Secondary | ICD-10-CM | POA: Diagnosis not present

## 2015-04-10 DIAGNOSIS — N390 Urinary tract infection, site not specified: Secondary | ICD-10-CM | POA: Diagnosis not present

## 2015-04-10 DIAGNOSIS — I509 Heart failure, unspecified: Secondary | ICD-10-CM | POA: Diagnosis not present

## 2015-04-10 DIAGNOSIS — J441 Chronic obstructive pulmonary disease with (acute) exacerbation: Secondary | ICD-10-CM | POA: Diagnosis not present

## 2015-04-10 DIAGNOSIS — D649 Anemia, unspecified: Secondary | ICD-10-CM | POA: Diagnosis not present

## 2015-04-10 LAB — COMPREHENSIVE METABOLIC PANEL
ALBUMIN: 3.4 g/dL — AB (ref 3.5–5.2)
ALK PHOS: 94 U/L (ref 39–117)
ALT: 28 U/L (ref 0–35)
AST: 33 U/L (ref 0–37)
BILIRUBIN TOTAL: 1.1 mg/dL (ref 0.2–1.2)
BUN: 31 mg/dL — ABNORMAL HIGH (ref 6–23)
CALCIUM: 9.1 mg/dL (ref 8.4–10.5)
CO2: 35 mEq/L — ABNORMAL HIGH (ref 19–32)
Chloride: 102 mEq/L (ref 96–112)
Creatinine, Ser: 1.12 mg/dL (ref 0.40–1.20)
GFR: 50.86 mL/min — AB (ref >60.00)
Glucose, Bld: 177 mg/dL — ABNORMAL HIGH (ref 70–99)
Potassium: 4 mEq/L (ref 3.5–5.1)
Sodium: 141 mEq/L (ref 135–145)
TOTAL PROTEIN: 6 g/dL (ref 6.0–8.3)

## 2015-04-10 LAB — POCT URINALYSIS DIPSTICK
BILIRUBIN UA: NEGATIVE
Blood, UA: NEGATIVE
Glucose, UA: NEGATIVE
Ketones, UA: NEGATIVE
LEUKOCYTES UA: NEGATIVE
NITRITE UA: NEGATIVE
PH UA: 6
Protein, UA: NEGATIVE
Spec Grav, UA: 1.015
Urobilinogen, UA: NEGATIVE

## 2015-04-10 LAB — CBC WITH DIFFERENTIAL/PLATELET
BASOS ABS: 0 10*3/uL (ref 0.0–0.1)
Basophils Relative: 0.4 % (ref 0.0–3.0)
Eosinophils Absolute: 0.2 10*3/uL (ref 0.0–0.7)
Eosinophils Relative: 5.1 % — ABNORMAL HIGH (ref 0.0–5.0)
HEMATOCRIT: 26.9 % — AB (ref 36.0–46.0)
LYMPHS PCT: 8.5 % — AB (ref 12.0–46.0)
Lymphs Abs: 0.3 10*3/uL — ABNORMAL LOW (ref 0.7–4.0)
MCHC: 32.9 g/dL (ref 30.0–36.0)
MCV: 102.2 fl — AB (ref 78.0–100.0)
MONOS PCT: 10.1 % (ref 3.0–12.0)
Monocytes Absolute: 0.3 10*3/uL (ref 0.1–1.0)
NEUTROS ABS: 2.6 10*3/uL (ref 1.4–7.7)
Neutrophils Relative %: 75.9 % (ref 43.0–77.0)
Platelets: 70 10*3/uL — ABNORMAL LOW (ref 150.0–400.0)
RBC: 2.63 Mil/uL — ABNORMAL LOW (ref 3.87–5.11)
RDW: 16.5 % — ABNORMAL HIGH (ref 11.5–15.5)
WBC: 3.4 10*3/uL — AB (ref 4.0–10.5)

## 2015-04-10 NOTE — Patient Instructions (Signed)
Your urine sample does not show an infection.  Complete lab work prior to leaving today. I will notify you of your results.  Ensure you are staying hydrated with water.  Please let me know if your cough becomes worse.  It was a pleasure meeting you!

## 2015-04-10 NOTE — Progress Notes (Signed)
Subjective:    Patient ID: [name], female    DOB: 11/25/1940, 74 y.o.   MRN: [id number]  HPI  [name] is a 74 year old female with an extensive medical history who presents today with multiple complaints.  1) Urinary Frequency: Increased with difficulty initiating a urinary stream. She also reports pelvic pressure and states these symptoms are similar to her prior UTI's.  2) Cough: Present since yesterday with greenish sputum. Denies fevers.   3) Dizziness: Present since coming home from rehab about 1 month ago. She is working with physical therapy at home who sent her to PCP office today because she couldn't participate due to weakness. She was recently hospitalized for anemia with a hemoglobin of 7.3. Her hemoglobin had stabilized as of 03/31/2015 at 9.2. She is currently on continuous oxygen at 3 liters.   4) Vomiting: Vomited once yesterday after binging on a large fluid bolus. Denies abdominal pain currently, has had some cramping over past several days.   Review of Systems  Constitutional: Positive for chills and fatigue. Negative for fever.  Respiratory: Negative for shortness of breath.   Cardiovascular: Negative for chest pain.  Gastrointestinal: Negative for nausea and abdominal pain.  Genitourinary: Positive for urgency, frequency and difficulty urinating. Negative for dysuria, hematuria and vaginal discharge.  Neurological: Positive for weakness.       Past Medical History  Diagnosis Date  . Chronic airway obstruction, not elsewhere classified   . Unspecified chronic bronchitis   . Unspecified essential hypertension   . Non-obstructive CAD   . Palpitations   . Mild aortic stenosis     a. 03/2014 Valve area (VTI): 2.89 cm^2, Valve area (Vmax): 2.7 cm^2.  . Pure hypercholesterolemia   . Morbid obesity   . Esophageal reflux   . GAVE (gastric antral vascular ectasia)     angiodysplasia  . Diverticulosis of colon (without mention of hemorrhage)   . Benign  neoplasm of colon   . Osteoarthrosis, unspecified whether generalized or localized, unspecified site   . Osteoporosis, unspecified   . Restless legs syndrome (RLS)   . Major depressive disorder, recurrent episode, severe, specified as with psychotic behavior   . Iron deficiency anemia secondary to blood loss (chronic)     a. frequent PRBC transfusions.  . Coarse tremors     a. arms.  . Carpal tunnel syndrome on both sides   . Chronic diastolic CHF (congestive heart failure)     a. 03/26/2014 Echo: EF 55-60%, no rwma, mild AS/AI, mod-sev Ca2+ MV annulus, mildly to mod dil LA.  [name] Asthma   . Type II diabetes mellitus   . H/O hiatal hernia   . 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 dx'd 1990's  . Adenomatous colon polyp   . Midsternal chest pain     a. conservatively managed ->poor candidate for cath/anticoagulation.  [name] Anxiety   . Portal hypertensive gastropathy   . Falls frequently     completed HHPT/OT 06/2014, PT unmet goals  . Recurrent UTI (urinary tract infection)     h/o hospitalization with urosepsis 2015, but thought large component colonization/bacteriuria, only treat if symptomatic ([place])  . Hiatal hernia   . Thrombocytopenia   . Protein calorie malnutrition   . History of pneumonia     CAP 2015  . On home oxygen therapy     "3L; 24/7" (01/25/2015)  . OSA (obstructive sleep apnea)     a. does not use  CPAP. (01/25/2015)  . GAVE (gastric antral vascular ectasia)     Social History   Social History  . Marital Status: Married    Spouse Name: jerry Douglass  . Number of Children: 4  . Years of Education: 12   Occupational History  . Disabled   .     Social History Main Topics  . Smoking status: Former Smoker -- 1.50 packs/day for 25 years    Types: Cigarettes    Quit date: 08/26/1992  . Smokeless tobacco: Never Used  . Alcohol Use: No     Comment: 0/62/69 "last alcoholic drink was years ago"  . Drug Use: No  . Sexual Activity: No     Other Topics Concern  . Not on file   Social History Narrative   Patient lives at home with her husband [name]).    Retired.   Caffeine- None    Right handed.      Dr. Olevia Perches GI    Dr. Burt Knack cards    Dr. Loanne Drilling endo   Dr Lenna Gilford pulm   Psych: Followed by [facility] (ph [phone], fax [phone]).     Past Surgical History  Procedure Laterality Date  . Hemorroidectomy    . Appendectomy    . Vaginal hysterectomy    . Breast biopsy      bilateral  . Cesarean section      x 3  . Appendectomy    . Esophagogastroduodenoscopy  06/12/2012    Procedure: ESOPHAGOGASTRODUODENOSCOPY (EGD);  Surgeon: Lafayette Dragon, MD;  Location: [facility];  Service: Endoscopy;  Laterality: N/A;  . Balloon dilation  06/12/2012    Procedure: BALLOON DILATION;  Surgeon: Lafayette Dragon, MD;  Location: [facility] END[facility];  Service: Endoscopy;  Laterality: N/A;  ?balloon  . Esophagogastroduodenoscopy  09/10/2012    Procedure: ESOPHAGOGASTRODUODENOSCOPY (EGD);  Surgeon: Lafayette Dragon, MD;  Location: [facility];  Service: Endoscopy;  Laterality: N/A;  . Hot hemostasis  09/10/2012    Procedure: HOT HEMOSTASIS (ARGON PLASMA COAGULATION/BICAP);  Surgeon: Lafayette Dragon, MD;  Location: [facility];  Service: Endoscopy;  Laterality: N/A;  . Esophagogastroduodenoscopy N/A 01/18/2013    Procedure: ESOPHAGOGASTRODUODENOSCOPY (EGD);  Surgeon: Lafayette Dragon, MD;  Location: [facility] ENDOSCOPY;  Service: Endoscopy;  Laterality: N/A;  . Esophagogastroduodenoscopy N/A 05/24/2013    Procedure: ESOPHAGOGASTRODUODENOSCOPY (EGD);  Surgeon: Jerene Bears, MD;  Location: [place];  Service: Gastroenterology;  Laterality: N/A;  . Hot hemostasis N/A 05/24/2013    Procedure: HOT HEMOSTASIS (ARGON PLASMA COAGULATION/BICAP);  Surgeon: Jerene Bears, MD;  Location: [place];  Service: Gastroenterology;  Laterality: N/A;  . US echocardiography  07/2014    mild LVH, EF 60-65%, normal wall motion, mild AR, mod dilated LA, mildly dilated  RA, peak PA pressure 89mmHg  . Esophagogastroduodenoscopy N/A 10/20/2014    Procedure: ESOPHAGOGASTRODUODENOSCOPY (EGD);  Surgeon: Lafayette Dragon, MD;  Location: [facility];  Service: Endoscopy;  Laterality: N/A;  . Hot hemostasis N/A 10/20/2014    Procedure: HOT HEMOSTASIS (ARGON PLASMA COAGULATION/BICAP);  Surgeon: Lafayette Dragon, MD;  Location: [facility];  Service: Endoscopy;  Laterality: N/A;  . Enteroscopy N/A 02/25/2015    Procedure: ENTEROSCOPY;  Surgeon: [name] Ada, MD;  Location: [place];  Service: Endoscopy;  Laterality: N/A;    Family History  Problem Relation Age of Onset  . Heart disease Mother   . Cervical cancer Mother   . Kidney disease Mother   . Diabetes Mother   . Breast cancer Sister   .  Multiple sclerosis Sister   . Colon cancer Neg Hx   . Breast cancer Maternal Aunt     x 2  . Diabetes Father   . Diabetes Sister   . Multiple sclerosis Other   . Heart attack Mother   . Heart attack Father   . Stroke Daughter   . Stroke Daughter     Allergies  Allergen Reactions  . Acetaminophen Other (See Comments)    Liver dysfunction  . Aspirin Other (See Comments)    Causes nosebleeds  . Theophylline Nausea And Vomiting  . Penicillins Itching    Current Outpatient Prescriptions on File Prior to Visit  Medication Sig Dispense Refill  . ADVAIR DISKUS 250-50 MCG/DOSE AEPB INHALE 1 PUFF INTO THE LUNGS 2 (TWO) TIMES DAILY. 60 each 4  . albuterol (PROAIR HFA) 108 (90 BASE) MCG/ACT inhaler Inhale 2 puffs into the lungs every 6 (six) hours as needed for wheezing or shortness of breath.     . ALPRAZolam (XANAX) 1 MG tablet Take 1 tablet (1 mg total) by mouth every 8 (eight) hours as needed. for anxiety 30 tablet 0  . b complex vitamins tablet Take 1 tablet by mouth daily.     [name] buPROPion (WELLBUTRIN XL) 150 MG 24 hr tablet Take 1 tablet (150 mg total) by mouth daily. 90 tablet 3  . darbepoetin (ARANESP) 200 MCG/0.4ML SOLN injection Inject 200 mcg into the skin every  14 (fourteen) days.    . furosemide (LASIX) 40 MG tablet Take 1 tablet (40 mg total) by mouth 2 (two) times daily. 30 tablet   . guaiFENesin (MUCINEX) 600 MG 12 hr tablet Take 600 mg by mouth 2 (two) times daily. 2 tablet by mouth once daily    . Insulin Glargine (LANTUS SOLOSTAR) 100 UNIT/ML Solostar Pen Inject 140 Units into the skin every morning. 20 pen 11  . insulin lispro (HUMALOG) 100 UNIT/ML KiwkPen INJECT 5-30 UNITS INTO THE SKIN AT 4PM PER SLIDING SCALE (5 for every 50 over 150)    . lactulose, encephalopathy, (CHRONULAC) 10 GM/15ML SOLN Take 30 mLs (20 g total) by mouth 2 (two) times daily. constipation (Patient taking differently: Take 20 g by mouth daily as needed (constipation). constipation) 2700 mL 6  . levalbuterol (XOPENEX) 0.63 MG/3ML nebulizer solution Take 0.63 mg by nebulization 3 (three) times daily.   5  . loratadine (CLARITIN) 10 MG tablet TAKE 1 TABLET EVERY DAY 30 tablet 6  . Multiple Vitamins-Minerals (CENTRUM SILVER PO) Take 1 tablet by mouth daily.     . nadolol (CORGARD) 20 MG tablet TAKE 1/2 TABLET BY MOUTH DAILY 15 tablet 1  . oxyCODONE (OXY IR/ROXICODONE) 5 MG immediate release tablet Take 1 tablet (5 mg total) by mouth every 6 (six) hours as needed for severe pain. 30 tablet 0  . pantoprazole (PROTONIX) 40 MG tablet Take 1 tablet (40 mg total) by mouth 2 (two) times daily. 60 tablet 6  . PRISTIQ 50 MG 24 hr tablet TAKE 3 TABLETS BY MOUTH EVERY DAY 90 tablet 1  . promethazine (PHENERGAN) 25 MG tablet Take 1 tablet (25 mg total) by mouth 2 (two) times daily as needed for nausea. 30 tablet 3  . QUEtiapine (SEROQUEL) 200 MG tablet Take 1 tablet (200 mg total) by mouth at bedtime. 30 tablet 6  . rifaximin (XIFAXAN) 550 MG TABS tablet Take 1 tablet (550 mg total) by mouth 2 (two) times daily.    [name] rOPINIRole (REQUIP) 4 MG tablet Take 1 tablet (4  mg total) by mouth at bedtime. 30 tablet 11  . Saline (OCEAN NASAL SPRAY NA) Place 1 spray into the nose 3 (three) times daily  as needed (congestion).     . simvastatin (ZOCOR) 40 MG tablet TAKE 1 TABLET BY MOUTH EVERY EVENING 30 tablet 3  . spironolactone (ALDACTONE) 25 MG tablet TAKE 1/2 TABLET BY MOUTH DAILY 15 tablet 1  . Vitamin D, Ergocalciferol, (DRISDOL) 50000 UNITS CAPS capsule TAKE 1 CAPSULE (50,000 UNITS TOTAL) BY MOUTH EVERY 7 (SEVEN) DAYS. TAKE ON FRIDAY 5 capsule 3   No current facility-administered medications on file prior to visit.    BP 124/72 mmHg  Pulse 47  Temp(Src) 97.8 F (36.6 C) (Oral)  Wt 219 lb 6.4 oz (99.519 kg)  SpO2 99%    Objective:   Physical Exam  Constitutional: She is oriented to person, place, and time. She appears well-nourished. She does not appear ill.  Cardiovascular: Normal rate and regular rhythm.   Pulmonary/Chest: Effort normal and breath sounds normal.  Abdominal: Soft. Bowel sounds are normal. There is no tenderness.  Neurological: She is alert and oriented to person, place, and time.  Skin: Skin is warm and dry.  Psychiatric: She has a normal mood and affect.          Assessment & Plan:  Weakness:  UA: Negative for leuks, nitrites, blood. Exam unremarkable. Given history of anemia will repeat CBC for further evalaution. CBC shows decrease in hemoglobin to 8.9. No elevation in white blood cell count. CMP mostly consistent with prior history. No cough present on exam. Will consult with PCP for ongoing monitoring of hemoglobin.

## 2015-04-10 NOTE — Progress Notes (Signed)
Pre visit review using our clinic review tool, if applicable. No additional management support is needed unless otherwise documented below in the visit note. 

## 2015-04-12 DIAGNOSIS — N390 Urinary tract infection, site not specified: Secondary | ICD-10-CM | POA: Diagnosis not present

## 2015-04-12 DIAGNOSIS — K7581 Nonalcoholic steatohepatitis (NASH): Secondary | ICD-10-CM | POA: Diagnosis not present

## 2015-04-12 DIAGNOSIS — I509 Heart failure, unspecified: Secondary | ICD-10-CM | POA: Diagnosis not present

## 2015-04-12 DIAGNOSIS — J441 Chronic obstructive pulmonary disease with (acute) exacerbation: Secondary | ICD-10-CM | POA: Diagnosis not present

## 2015-04-12 DIAGNOSIS — F419 Anxiety disorder, unspecified: Secondary | ICD-10-CM | POA: Diagnosis not present

## 2015-04-12 DIAGNOSIS — E119 Type 2 diabetes mellitus without complications: Secondary | ICD-10-CM | POA: Diagnosis not present

## 2015-04-12 DIAGNOSIS — D61818 Other pancytopenia: Secondary | ICD-10-CM | POA: Diagnosis not present

## 2015-04-12 DIAGNOSIS — D649 Anemia, unspecified: Secondary | ICD-10-CM | POA: Diagnosis not present

## 2015-04-13 ENCOUNTER — Encounter: Payer: Self-pay | Admitting: Endocrinology

## 2015-04-13 ENCOUNTER — Ambulatory Visit (INDEPENDENT_AMBULATORY_CARE_PROVIDER_SITE_OTHER): Payer: Medicare Other | Admitting: Endocrinology

## 2015-04-13 VITALS — BP 118/76 | HR 54 | Temp 98.1°F | Wt 218.0 lb

## 2015-04-13 DIAGNOSIS — E118 Type 2 diabetes mellitus with unspecified complications: Secondary | ICD-10-CM

## 2015-04-13 DIAGNOSIS — IMO0002 Reserved for concepts with insufficient information to code with codable children: Secondary | ICD-10-CM

## 2015-04-13 DIAGNOSIS — E1165 Type 2 diabetes mellitus with hyperglycemia: Secondary | ICD-10-CM

## 2015-04-13 LAB — POCT GLYCOSYLATED HEMOGLOBIN (HGB A1C): Hemoglobin A1C: 5.4

## 2015-04-13 MED ORDER — INSULIN GLARGINE 100 UNIT/ML SOLOSTAR PEN: 120.0000 [IU] | PEN_INJECTOR | SUBCUTANEOUS | Status: DC

## 2015-04-13 NOTE — Patient Instructions (Signed)
please reduce the lantus to 120 units each morning, and: Take just 10 units of humalog with the evening mea, no matter what your blood sugar is. check your blood sugar twice a day.  vary the time of day when you check, between before the 3 meals, and at bedtime.  also check if you have symptoms of your blood sugar being too high or too low.  please keep a record of the readings and bring it to your next appointment here.  You can write it on any piece of paper.  please call us sooner if your blood sugar goes below 70, or if you have a lot of readings over 200. Please come back for a follow-up appointment in 3 months.

## 2015-04-13 NOTE — Progress Notes (Signed)
Subjective:    Patient ID: [name], female    DOB: [birth year], 74 y.o.   MRN: [id number]  HPI Pt returns for f/u of diabetes mellitus: DM type: Insulin-requiring type 2 Dx'ed: 8453 Complications: polyneuropathy, foot ulcer, renal insufficiency, and CAD Therapy: insulin since 2010 GDM: never. DKA: never. Severe hypoglycemia: never.  Pancreatitis: never.  Other: she needs a simple insulin regimen, due to low functional state, but she takes her own insulin.  Interval history: she takes novolog only prn.  cbg record is downloaded from her meter, and scanned into the record.  It varies from 100-400, but most are in the 100's.  It is in general higher as the day goes on, but not necessarily so.  She takes lantus 140 units qam, and prn humalog only in the afternoon (avg 10-20 units/d).   Past Medical History  Diagnosis Date  . Chronic airway obstruction, not elsewhere classified   . Unspecified chronic bronchitis   . Unspecified essential hypertension   . Non-obstructive CAD   . Palpitations   . Mild aortic stenosis     a. 03/2014 Valve area (VTI): 2.89 cm^2, Valve area (Vmax): 2.7 cm^2.  . Pure hypercholesterolemia   . Morbid obesity   . Esophageal reflux   . GAVE (gastric antral vascular ectasia)     angiodysplasia  . Diverticulosis of colon (without mention of hemorrhage)   . Benign neoplasm of colon   . Osteoarthrosis, unspecified whether generalized or localized, unspecified site   . Osteoporosis, unspecified   . Restless legs syndrome (RLS)   . Major depressive disorder, recurrent episode, severe, specified as with psychotic behavior   . Iron deficiency anemia secondary to blood loss (chronic)     a. frequent PRBC transfusions.  . Coarse tremors     a. arms.  . Carpal tunnel syndrome on both sides   . Chronic diastolic CHF (congestive heart failure)     a. 03/26/2014 Echo: EF 55-60%, no rwma, mild AS/AI, mod-sev Ca2+ MV annulus, mildly to mod dil LA.  [name] Asthma   .  Type II diabetes mellitus   . H/O hiatal hernia   . 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 dx'd 1990's  . Adenomatous colon polyp   . Midsternal chest pain     a. conservatively managed ->poor candidate for cath/anticoagulation.  [name] Anxiety   . Portal hypertensive gastropathy   . Falls frequently     completed HHPT/OT 06/2014, PT unmet goals  . Recurrent UTI (urinary tract infection)     h/o hospitalization with urosepsis 2015, but thought large component colonization/bacteriuria, only treat if symptomatic ([place])  . Hiatal hernia   . Thrombocytopenia   . Protein calorie malnutrition   . History of pneumonia     CAP 2015  . On home oxygen therapy     "3L; 24/7" (01/25/2015)  . OSA (obstructive sleep apnea)     a. does not use CPAP. (01/25/2015)  . GAVE (gastric antral vascular ectasia)     Past Surgical History  Procedure Laterality Date  . Hemorroidectomy    . Appendectomy    . Vaginal hysterectomy    . Breast biopsy      bilateral  . Cesarean section      x 3  . Appendectomy    . Esophagogastroduodenoscopy  06/12/2012    Procedure: ESOPHAGOGASTRODUODENOSCOPY (EGD);  Surgeon: Lafayette Dragon, MD;  Location: [facility];  Service: Endoscopy;  Laterality: N/A;  .  Balloon dilation  06/12/2012    Procedure: BALLOON DILATION;  Surgeon: Lafayette Dragon, MD;  Location: [facility];  Service: Endoscopy;  Laterality: N/A;  ?balloon  . Esophagogastroduodenoscopy  09/10/2012    Procedure: ESOPHAGOGASTRODUODENOSCOPY (EGD);  Surgeon: Lafayette Dragon, MD;  Location: [facility];  Service: Endoscopy;  Laterality: N/A;  . Hot hemostasis  09/10/2012    Procedure: HOT HEMOSTASIS (ARGON PLASMA COAGULATION/BICAP);  Surgeon: Lafayette Dragon, MD;  Location: [facility];  Service: Endoscopy;  Laterality: N/A;  . Esophagogastroduodenoscopy N/A 01/18/2013    Procedure: ESOPHAGOGASTRODUODENOSCOPY (EGD);  Surgeon: Lafayette Dragon, MD;  Location: [facility];  Service:  Endoscopy;  Laterality: N/A;  . Esophagogastroduodenoscopy N/A 05/24/2013    Procedure: ESOPHAGOGASTRODUODENOSCOPY (EGD);  Surgeon: Jerene Bears, MD;  Location: [place];  Service: Gastroenterology;  Laterality: N/A;  . Hot hemostasis N/A 05/24/2013    Procedure: HOT HEMOSTASIS (ARGON PLASMA COAGULATION/BICAP);  Surgeon: Jerene Bears, MD;  Location: [place];  Service: Gastroenterology;  Laterality: N/A;  . US echocardiography  07/2014    mild LVH, EF 60-65%, normal wall motion, mild AR, mod dilated LA, mildly dilated RA, peak PA pressure 73mmHg  . Esophagogastroduodenoscopy N/A 10/20/2014    Procedure: ESOPHAGOGASTRODUODENOSCOPY (EGD);  Surgeon: Lafayette Dragon, MD;  Location: [facility];  Service: Endoscopy;  Laterality: N/A;  . Hot hemostasis N/A 10/20/2014    Procedure: HOT HEMOSTASIS (ARGON PLASMA COAGULATION/BICAP);  Surgeon: Lafayette Dragon, MD;  Location: [facility];  Service: Endoscopy;  Laterality: N/A;  . Enteroscopy N/A 02/25/2015    Procedure: ENTEROSCOPY;  Surgeon: [name] Ada, MD;  Location: [place];  Service: Endoscopy;  Laterality: N/A;    Social History   Social History  . Marital Status: Married    Spouse Name: jerry Kozikowski  . Number of Children: 4  . Years of Education: 12   Occupational History  . Disabled   .     Social History Main Topics  . Smoking status: Former Smoker -- 1.50 packs/day for 25 years    Types: Cigarettes    Quit date: 08/26/1992  . Smokeless tobacco: Never Used  . Alcohol Use: No     Comment: 2/99/37 "last alcoholic drink was years ago"  . Drug Use: No  . Sexual Activity: No   Other Topics Concern  . Not on file   Social History Narrative   Patient lives at home with her husband [name]).    Retired.   Caffeine- None    Right handed.      Dr. Olevia Perches GI    Dr. Burt Knack cards    Dr. Loanne Drilling endo   Dr Lenna Gilford pulm   Psych: Followed by [facility] (ph [phone], fax [phone]).     Current Outpatient Prescriptions on  File Prior to Visit  Medication Sig Dispense Refill  . ADVAIR DISKUS 250-50 MCG/DOSE AEPB INHALE 1 PUFF INTO THE LUNGS 2 (TWO) TIMES DAILY. 60 each 4  . albuterol (PROAIR HFA) 108 (90 BASE) MCG/ACT inhaler Inhale 2 puffs into the lungs every 6 (six) hours as needed for wheezing or shortness of breath.     . ALPRAZolam (XANAX) 1 MG tablet Take 1 tablet (1 mg total) by mouth every 8 (eight) hours as needed. for anxiety 30 tablet 0  . b complex vitamins tablet Take 1 tablet by mouth daily.     [name] buPROPion (WELLBUTRIN XL) 150 MG 24 hr tablet Take 1 tablet (150 mg total) by mouth daily. 90 tablet 3  . darbepoetin (ARANESP)  200 MCG/0.4ML SOLN injection Inject 200 mcg into the skin every 14 (fourteen) days.    . furosemide (LASIX) 40 MG tablet Take 1 tablet (40 mg total) by mouth 2 (two) times daily. 30 tablet   . guaiFENesin (MUCINEX) 600 MG 12 hr tablet Take 600 mg by mouth 2 (two) times daily. 2 tablet by mouth once daily    . lactulose, encephalopathy, (CHRONULAC) 10 GM/15ML SOLN Take 30 mLs (20 g total) by mouth 2 (two) times daily. constipation (Patient taking differently: Take 20 g by mouth daily as needed (constipation). constipation) 2700 mL 6  . levalbuterol (XOPENEX) 0.63 MG/3ML nebulizer solution Take 0.63 mg by nebulization 3 (three) times daily.   5  . loratadine (CLARITIN) 10 MG tablet TAKE 1 TABLET EVERY DAY 30 tablet 6  . Multiple Vitamins-Minerals (CENTRUM SILVER PO) Take 1 tablet by mouth daily.     . nadolol (CORGARD) 20 MG tablet TAKE 1/2 TABLET BY MOUTH DAILY 15 tablet 1  . oxyCODONE (OXY IR/ROXICODONE) 5 MG immediate release tablet Take 1 tablet (5 mg total) by mouth every 6 (six) hours as needed for severe pain. 30 tablet 0  . pantoprazole (PROTONIX) 40 MG tablet Take 1 tablet (40 mg total) by mouth 2 (two) times daily. 60 tablet 6  . PRISTIQ 50 MG 24 hr tablet TAKE 3 TABLETS BY MOUTH EVERY DAY 90 tablet 1  . promethazine (PHENERGAN) 25 MG tablet Take 1 tablet (25 mg total) by  mouth 2 (two) times daily as needed for nausea. 30 tablet 3  . QUEtiapine (SEROQUEL) 200 MG tablet Take 1 tablet (200 mg total) by mouth at bedtime. 30 tablet 6  . rifaximin (XIFAXAN) 550 MG TABS tablet Take 1 tablet (550 mg total) by mouth 2 (two) times daily.    [name] rOPINIRole (REQUIP) 4 MG tablet Take 1 tablet (4 mg total) by mouth at bedtime. 30 tablet 11  . Saline (OCEAN NASAL SPRAY NA) Place 1 spray into the nose 3 (three) times daily as needed (congestion).     . simvastatin (ZOCOR) 40 MG tablet TAKE 1 TABLET BY MOUTH EVERY EVENING 30 tablet 3  . spironolactone (ALDACTONE) 25 MG tablet TAKE 1/2 TABLET BY MOUTH DAILY 15 tablet 1  . Vitamin D, Ergocalciferol, (DRISDOL) 50000 UNITS CAPS capsule TAKE 1 CAPSULE (50,000 UNITS TOTAL) BY MOUTH EVERY 7 (SEVEN) DAYS. TAKE ON FRIDAY 5 capsule 3   No current facility-administered medications on file prior to visit.    Allergies  Allergen Reactions  . Acetaminophen Other (See Comments)    Liver dysfunction  . Aspirin Other (See Comments)    Causes nosebleeds  . Theophylline Nausea And Vomiting  . Penicillins Itching    Family History  Problem Relation Age of Onset  . Heart disease Mother   . Cervical cancer Mother   . Kidney disease Mother   . Diabetes Mother   . Breast cancer Sister   . Multiple sclerosis Sister   . Colon cancer Neg Hx   . Breast cancer Maternal Aunt     x 2  . Diabetes Father   . Diabetes Sister   . Multiple sclerosis Other   . Heart attack Mother   . Heart attack Father   . Stroke Daughter   . Stroke Daughter     BP 118/76 mmHg  Pulse 54  Temp(Src) 98.1 F (36.7 C) (Oral)  Wt 218 lb (98.884 kg)  SpO2 95%  Review of Systems She denies hypoglycemia.  She has lost  a few lbs.    Objective:   Physical Exam VITAL SIGNS:  See vs page GENERAL: no distress Pulses: dorsalis pedis intact bilat.   MSK: no deformity of the feet CV: trace bilat leg edema Skin:  no ulcer on the feet.  normal color and temp on  the feet. Neuro: sensation is intact to touch on the feet, but decreased from normal.    A1c=5.4%    Assessment & Plan:  DM: overcontrolled.  Patient is advised the following: Patient Instructions  please reduce the lantus to 120 units each morning, and: Take just 10 units of humalog with the evening mea, no matter what your blood sugar is. check your blood sugar twice a day.  vary the time of day when you check, between before the 3 meals, and at bedtime.  also check if you have symptoms of your blood sugar being too high or too low.  please keep a record of the readings and bring it to your next appointment here.  You can write it on any piece of paper.  please call us sooner if your blood sugar goes below 70, or if you have a lot of readings over 200. Please come back for a follow-up appointment in 3 months.

## 2015-04-14 ENCOUNTER — Other Ambulatory Visit: Payer: Self-pay

## 2015-04-14 ENCOUNTER — Encounter (HOSPITAL_COMMUNITY)
Admission: RE | Admit: 2015-04-14 | Discharge: 2015-04-14 | Disposition: A | Discharge status: Home / Self Care | Payer: Medicare Other | Source: Ambulatory Visit | Attending: Family Medicine | Admitting: Family Medicine

## 2015-04-14 DIAGNOSIS — N189 Chronic kidney disease, unspecified: Secondary | ICD-10-CM | POA: Diagnosis not present

## 2015-04-14 DIAGNOSIS — D500 Iron deficiency anemia secondary to blood loss (chronic): Secondary | ICD-10-CM | POA: Diagnosis not present

## 2015-04-14 LAB — POCT HEMOGLOBIN-HEMACUE: Hemoglobin: 8.3 g/dL — ABNORMAL LOW (ref 12.0–15.0)

## 2015-04-14 MED ORDER — CLOTRIMAZOLE 1 % EX CREA: TOPICAL_CREAM | Freq: Two times a day (BID) | CUTANEOUS | Status: DC | PRN

## 2015-04-14 MED ORDER — DARBEPOETIN ALFA 200 MCG/0.4ML IJ SOSY
200.0000 ug | PREFILLED_SYRINGE | INTRAMUSCULAR | Status: DC
  Administered 2015-04-14: 200 ug via SUBCUTANEOUS

## 2015-04-14 MED ORDER — DARBEPOETIN ALFA 200 MCG/0.4ML IJ SOSY
PREFILLED_SYRINGE | INTRAMUSCULAR | Status: AC
  Filled 2015-04-14: qty 0.4

## 2015-04-14 NOTE — Telephone Encounter (Signed)
Sent in

## 2015-04-14 NOTE — Telephone Encounter (Signed)
V/M left requesting refill lotrimin cream to [facility] [address]; pt has fine red rash under breast with foul smell; pt has had rash on and off for 2 years and now worse under rt breast; has used lotrimin cream when at rehab at [facility] and the lotrimin helped the rash. Pt request cb.

## 2015-04-17 ENCOUNTER — Other Ambulatory Visit: Payer: Self-pay | Admitting: *Deleted

## 2015-04-17 ENCOUNTER — Encounter: Payer: Self-pay | Admitting: *Deleted

## 2015-04-17 ENCOUNTER — Other Ambulatory Visit: Payer: Self-pay | Admitting: Family Medicine

## 2015-04-17 ENCOUNTER — Other Ambulatory Visit: Payer: Self-pay | Admitting: Nurse Practitioner

## 2015-04-17 NOTE — Telephone Encounter (Signed)
Received refill request electronically Last office visit 04/10/15 Is it okay to refill medication, how many refills?

## 2015-04-17 NOTE — Patient Outreach (Signed)
[name]) Care Management   04/17/2015  [name] 15-May-1941 [id number]  [name] is an 74 y.o. female  Subjective: Pt reports increased SOB since Saturday. She has increased her lasix on her own to 80 mg in am and 40 mg in pm. He weight is also up to 224 and it was 218 two weeks ago.   Objective:   Review of Systems  HENT: Negative.   Eyes: Negative.   Respiratory: Positive for cough, sputum production and shortness of breath.   Cardiovascular: Negative.   Gastrointestinal: Negative.   Genitourinary: Negative.   Musculoskeletal: Negative.   Skin:       Bruising.  Neurological: Positive for dizziness and weakness.  Endo/Heme/Allergies: Bruises/bleeds easily.  Psychiatric/Behavioral: Positive for depression. The patient is nervous/anxious.    BP 110/40 mmHg  Pulse 56  Resp 18  Wt 224 lb (101.606 kg)  SpO2 95%   Physical Exam  Constitutional: She appears well-developed and well-nourished.  HENT:  Head: Normocephalic.  Neck: Normal range of motion.  Cardiovascular: Normal rate, regular rhythm and normal heart sounds.   Respiratory: Effort normal.  Diminished.  GI: Soft. Bowel sounds are normal.  Musculoskeletal:  Sore neck and shoulders. Limited mobility.  Skin: Skin is warm and dry. Rash noted.  Skin eruptions on abdomen and lower extremities. Skin is very pale.    Current Medications:   Current Outpatient Prescriptions  Medication Sig Dispense Refill  . ADVAIR DISKUS 250-50 MCG/DOSE AEPB INHALE 1 PUFF INTO THE LUNGS 2 (TWO) TIMES DAILY. 60 each 4  . albuterol (PROAIR HFA) 108 (90 BASE) MCG/ACT inhaler Inhale 2 puffs into the lungs every 6 (six) hours as needed for wheezing or shortness of breath.     . ALPRAZolam (XANAX) 1 MG tablet Take 1 tablet (1 mg total) by mouth every 8 (eight) hours as needed. for anxiety 30 tablet 0  . b complex vitamins tablet Take 1 tablet by mouth daily.     [name] buPROPion (WELLBUTRIN XL) 150 MG 24 hr  tablet Take 1 tablet (150 mg total) by mouth daily. 90 tablet 3  . clotrimazole (LOTRIMIN) 1 % cream Apply topically 2 (two) times daily as needed. 30 g 1  . darbepoetin (ARANESP) 200 MCG/0.4ML SOLN injection Inject 200 mcg into the skin every 14 (fourteen) days.    . furosemide (LASIX) 40 MG tablet Take 1 tablet (40 mg total) by mouth 2 (two) times daily. 30 tablet   . guaiFENesin (MUCINEX) 600 MG 12 hr tablet Take 600 mg by mouth 2 (two) times daily. 2 tablet by mouth once daily    . Insulin Glargine (LANTUS SOLOSTAR) 100 UNIT/ML Solostar Pen Inject 120 Units into the skin every morning. 20 pen 11  . insulin lispro (HUMALOG KWIKPEN) 100 UNIT/ML KiwkPen Inject 10 Units into the skin daily with supper.    . lactulose, encephalopathy, (CHRONULAC) 10 GM/15ML SOLN Take 30 mLs (20 g total) by mouth 2 (two) times daily. constipation (Patient taking differently: Take 20 g by mouth daily as needed (constipation). constipation) 2700 mL 6  . levalbuterol (XOPENEX) 0.63 MG/3ML nebulizer solution Take 0.63 mg by nebulization 3 (three) times daily.   5  . loratadine (CLARITIN) 10 MG tablet TAKE 1 TABLET EVERY DAY 30 tablet 6  . Multiple Vitamins-Minerals (CENTRUM SILVER PO) Take 1 tablet by mouth daily.     . nadolol (CORGARD) 20 MG tablet TAKE 1/2 TABLET BY MOUTH DAILY 15 tablet 1  . oxyCODONE (OXY IR/ROXICODONE)  5 MG immediate release tablet Take 1 tablet (5 mg total) by mouth every 6 (six) hours as needed for severe pain. 30 tablet 0  . pantoprazole (PROTONIX) 40 MG tablet Take 1 tablet (40 mg total) by mouth 2 (two) times daily. 60 tablet 6  . PRISTIQ 50 MG 24 hr tablet TAKE 3 TABLETS BY MOUTH EVERY DAY 90 tablet 1  . promethazine (PHENERGAN) 25 MG tablet Take 1 tablet (25 mg total) by mouth 2 (two) times daily as needed for nausea. 30 tablet 3  . QUEtiapine (SEROQUEL) 200 MG tablet Take 1 tablet (200 mg total) by mouth at bedtime. 30 tablet 6  . rifaximin (XIFAXAN) 550 MG TABS tablet Take 1 tablet (550 mg  total) by mouth 2 (two) times daily.    [name] rOPINIRole (REQUIP) 4 MG tablet Take 1 tablet (4 mg total) by mouth at bedtime. 30 tablet 11  . Saline (OCEAN NASAL SPRAY NA) Place 1 spray into the nose 3 (three) times daily as needed (congestion).     . simvastatin (ZOCOR) 40 MG tablet TAKE 1 TABLET BY MOUTH EVERY EVENING 30 tablet 3  . spironolactone (ALDACTONE) 25 MG tablet TAKE 1/2 TABLET BY MOUTH DAILY 15 tablet 1  . Vitamin D, Ergocalciferol, (DRISDOL) 50000 UNITS CAPS capsule TAKE 1 CAPSULE (50,000 UNITS TOTAL) BY MOUTH EVERY 7 (SEVEN) DAYS. TAKE ON FRIDAY 5 capsule 3   No current facility-administered medications for this visit.    Functional Status:   In your present state of health, do you have any difficulty performing the following activities: 04/14/2015 03/31/2015  Hearing? N N  Vision? N N  Difficulty concentrating or making decisions? N N  Walking or climbing stairs? Y Y  Dressing or bathing? N N  Doing errands, shopping? - Facilities manager and eating ? - -  Using the Toilet? - -  In the past six months, have you accidently leaked urine? - -  Do you have problems with loss of bowel control? - -  Managing your Medications? - -  Managing your Finances? - -  Housekeeping or managing your Housekeeping? - -    Fall/Depression Screening:    PHQ 2/9 Scores 04/03/2015 01/05/2015 12/19/2014 12/19/2014  PHQ - 2 Score 1 2 2 2   PHQ- 9 Score - - - 10    Assessment:  Diabetes                          COPD                          HF                          Anemia  Plan:  I will call pt daily until she goes to see Dr. Olevia Perches on Thursday.   [facility] CM Care Plan Problem One        Patient Outreach from 04/17/2015 in [place] Problem One  Pt high risk for readmission related to multiple health conditions   Care Plan for Problem One  Active   [facility] CM Short Term Goal #1 (0-30 days)  pt will have no readmissions related to chronic health conditions within 30 days   [facility]  CM Short Term Goal #1 Start Date  03/20/15   Interventions for Short Term Goal #1  Reinforced self management for all her chronic  problems: HF, COPD, DM     [facility] CM Care Plan Problem Two        Patient Outreach from 04/17/2015 in [place] Problem Two  Diabetes   Care Plan for Problem Two  Active   [facility]N Long Term Goal (31-90) days  Pt will have reduced average monthly glucose values over the next 90 days.   [facility] CM Short Term Goal #1 (0-30 days)  Pt will monitor her glucose level bid and record for the next 30 days.   [facility] CM Short Term Goal #1 Start Date  03/27/15   Interventions for Short Term Goal #2   Advised you to take Lantus every day even with a low FBS level. Reinforced low carb   [facility]N CM Short Term Goal #2 (0-30 days)  Pt will have at least 50% of her glucose values under 300.   Interventions for Short Term Goal #2  Pt did have >50% <300 two weeks, which is very good for her. Her H1Cwas 5.4. I encouraged doing what she is doing but keeping in mind her high levels are doing her blood vessels damage.    [place] Problem Three        Patient Outreach from 04/17/2015 in [place] Problem Three  HF   Care Plan for Problem Three  Active   THN CM Short Term Goal #1 (0-30 days)  Pt to weigh daily and report to NP wt gain of 3-5 lbs above current wt. 209 over next 30 days.   [facility] CM Short Term Goal #1 Start Date  03/27/15   Interventions for Short Term Goal #1  Pt weight is up and she has some mild sxs, increased SOB with production of mucous. Lungs diminished. Continue Same lasix dose. I will call pt daily until Thursday when she goes to see GI specialist. this problem could be her anemia and her Hgb dropping.     Deloria Lair [facility] [name] [phone]

## 2015-04-18 ENCOUNTER — Ambulatory Visit (INDEPENDENT_AMBULATORY_CARE_PROVIDER_SITE_OTHER): Payer: Medicare Other | Admitting: Internal Medicine

## 2015-04-18 ENCOUNTER — Telehealth: Payer: Self-pay | Admitting: Pulmonary Disease

## 2015-04-18 ENCOUNTER — Telehealth: Payer: Self-pay

## 2015-04-18 ENCOUNTER — Encounter: Payer: Self-pay | Admitting: Internal Medicine

## 2015-04-18 VITALS — BP 142/68 | HR 61 | Temp 98.3°F | Wt 225.0 lb

## 2015-04-18 DIAGNOSIS — E119 Type 2 diabetes mellitus without complications: Secondary | ICD-10-CM | POA: Diagnosis not present

## 2015-04-18 DIAGNOSIS — F419 Anxiety disorder, unspecified: Secondary | ICD-10-CM | POA: Diagnosis not present

## 2015-04-18 DIAGNOSIS — D61818 Other pancytopenia: Secondary | ICD-10-CM | POA: Diagnosis not present

## 2015-04-18 DIAGNOSIS — I509 Heart failure, unspecified: Secondary | ICD-10-CM | POA: Diagnosis not present

## 2015-04-18 DIAGNOSIS — J209 Acute bronchitis, unspecified: Secondary | ICD-10-CM

## 2015-04-18 DIAGNOSIS — K7581 Nonalcoholic steatohepatitis (NASH): Secondary | ICD-10-CM | POA: Diagnosis not present

## 2015-04-18 DIAGNOSIS — D649 Anemia, unspecified: Secondary | ICD-10-CM | POA: Diagnosis not present

## 2015-04-18 DIAGNOSIS — N390 Urinary tract infection, site not specified: Secondary | ICD-10-CM | POA: Diagnosis not present

## 2015-04-18 DIAGNOSIS — J441 Chronic obstructive pulmonary disease with (acute) exacerbation: Secondary | ICD-10-CM | POA: Diagnosis not present

## 2015-04-18 MED ORDER — DOXYCYCLINE HYCLATE 100 MG PO TABS: 100.0000 mg | ORAL_TABLET | Freq: Two times a day (BID) | ORAL | Status: DC

## 2015-04-18 NOTE — Telephone Encounter (Signed)
[name] OT with [facility] left v/m; BP today 106/54 P 52; this is consistent with recent vital sign checking. Today pt advised [name] she did not feel well with breathing. Pt does not think it is CHF it is more COPD symptoms. I spoke with pt and she is having problems getting her breath. Pt has prod cough with yellow to green phlegm.head congestion. No fever and no wheezing. Pt has been resting today. Pt said the nurse saw pt 04/17/15 and home health nurse is to call pt today to ck on. Pt feels nauseated but has not vomited. Pt has been eating and drinking. Pt feels tired today and when stands feels little dizzy. No CP or H/A . Pt wanted to know if there was appt available today; pt scheduled to see Avie Echevaria NP today at 4:15 pm.

## 2015-04-18 NOTE — Progress Notes (Signed)
HPI  Pt presents to the clinic today with c/o cough and shortness of breath. This started 3 days ago. The cough is productive of thick yellow mucous. She has had some associated fatigue, nasal congestion and nausea. She denies fever, chills or body aches. She has not tried anything OTC. She does have a history of COPD on continuous O2, asthma, CHF and OSA. She has not required any increased oxygen over the last few days. She has not had sick contacts.  Review of Systems      Past Medical History  Diagnosis Date  . Chronic airway obstruction, not elsewhere classified   . Unspecified chronic bronchitis   . Unspecified essential hypertension   . Non-obstructive CAD   . Palpitations   . Mild aortic stenosis     a. 03/2014 Valve area (VTI): 2.89 cm^2, Valve area (Vmax): 2.7 cm^2.  . Pure hypercholesterolemia   . Morbid obesity   . Esophageal reflux   . GAVE (gastric antral vascular ectasia)     angiodysplasia  . Diverticulosis of colon (without mention of hemorrhage)   . Benign neoplasm of colon   . Osteoarthrosis, unspecified whether generalized or localized, unspecified site   . Osteoporosis, unspecified   . Restless legs syndrome (RLS)   . Major depressive disorder, recurrent episode, severe, specified as with psychotic behavior   . Iron deficiency anemia secondary to blood loss (chronic)     a. frequent PRBC transfusions.  . Coarse tremors     a. arms.  . Carpal tunnel syndrome on both sides   . Chronic diastolic CHF (congestive heart failure)     a. 03/26/2014 Echo: EF 55-60%, no rwma, mild AS/AI, mod-sev Ca2+ MV annulus, mildly to mod dil LA.  [name] Asthma   . Type II diabetes mellitus   . H/O hiatal hernia   . 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 dx'd 1990's  . Adenomatous colon polyp   . Midsternal chest pain     a. conservatively managed ->poor candidate for cath/anticoagulation.  [name] Anxiety   . Portal hypertensive gastropathy   . Falls  frequently     completed HHPT/OT 06/2014, PT unmet goals  . Recurrent UTI (urinary tract infection)     h/o hospitalization with urosepsis 2015, but thought large component colonization/bacteriuria, only treat if symptomatic (Grapey)  . Hiatal hernia   . Thrombocytopenia   . Protein calorie malnutrition   . History of pneumonia     CAP 2015  . On home oxygen therapy     "3L; 24/7" (01/25/2015)  . OSA (obstructive sleep apnea)     a. does not use CPAP. (01/25/2015)  . GAVE (gastric antral vascular ectasia)     Family History  Problem Relation Age of Onset  . Heart disease Mother   . Cervical cancer Mother   . Kidney disease Mother   . Diabetes Mother   . Breast cancer Sister   . Multiple sclerosis Sister   . Colon cancer Neg Hx   . Breast cancer Maternal Aunt     x 2  . Diabetes Father   . Diabetes Sister   . Multiple sclerosis Other   . Heart attack Mother   . Heart attack Father   . Stroke Daughter   . Stroke Daughter     Social History   Social History  . Marital Status: Married    Spouse Name: jerry Tsuda  . Number of Children: 4  . Years of  Education: 12   Occupational History  . Disabled   .     Social History Main Topics  . Smoking status: Former Smoker -- 1.50 packs/day for 25 years    Types: Cigarettes    Quit date: 08/26/1992  . Smokeless tobacco: Never Used  . Alcohol Use: No     Comment: 5/72/62 "last alcoholic drink was years ago"  . Drug Use: No  . Sexual Activity: No   Other Topics Concern  . Not on file   Social History Narrative   Patient lives at home with her husband [name]).    Retired.   Caffeine- None    Right handed.      Dr. Olevia Perches GI    Dr. Burt Knack cards    Dr. Loanne Drilling endo   Dr Lenna Gilford pulm   Psych: Followed by [facility] (ph [phone], fax [phone]).     Allergies  Allergen Reactions  . Acetaminophen Other (See Comments)    Liver dysfunction  . Aspirin Other (See Comments)    Causes nosebleeds  . Theophylline  Nausea And Vomiting  . Penicillins Itching     Constitutional: Positive fatigue. Denies fever, headache or abrupt weight changes.  HEENT:  Positive nasal congestion. Denies eye redness, eye pain, pressure behind the eyes, facial pain, ear pain, ringing in the ears, wax buildup, runny nose or sore throat. Respiratory: Positive cough and shortness of breath. Denies difficulty breathing.  Cardiovascular: Denies chest pain, chest tightness, palpitations or swelling in the hands or feet.   No other specific complaints in a complete review of systems (except as listed in HPI above).  Objective:   BP 142/68 mmHg  Pulse 61  Temp(Src) 98.3 F (36.8 C) (Oral)  Wt 225 lb (102.059 kg)  SpO2 98%  Wt Readings from Last 3 Encounters:  04/18/15 225 lb (102.059 kg)  04/17/15 224 lb (101.606 kg)  04/13/15 218 lb (98.884 kg)     General: Appears her stated age, chronically ill appearing in NAD. HEENT: Head: normal shape and size; Eyes: sclera white, no icterus, conjunctiva pink; Ears: Tm's gray and intact, normal light reflex; Nose: mucosa pink and moist, septum midline; Throat/Mouth: + PND. Mucosa pink and moist, no exudate noted, no lesions or ulcerations noted.  Neck: No cervical lymphadenopathy.  Cardiovascular: Normal rate and rhythm. S1,S2 noted.  Murmur noted. Pulmonary/Chest: Normal effort and rhonchi noted in the RLL. No respiratory distress noted.     Assessment & Plan:   Acute Bronchitis:  Get some rest and drink plenty of water Doxycycline 100 mg BID x 10 days Delsym for cough as needed If no improvement, will order chest xray  RTC as needed or if symptoms persist.

## 2015-04-18 NOTE — Telephone Encounter (Signed)
Patient calling to get samples of Advair 250 We do not currently have any samples of Advair 250 Patient notified. Nothing further needed.

## 2015-04-18 NOTE — Progress Notes (Signed)
Pre visit review using our clinic review tool, if applicable. No additional management support is needed unless otherwise documented below in the visit note. 

## 2015-04-18 NOTE — Patient Instructions (Signed)
Cough, Adult  A cough is a reflex that helps clear your throat and airways. It can help heal the body or may be a reaction to an irritated airway. A cough may only last 2 or 3 weeks (acute) or may last more than 8 weeks (chronic).  CAUSES Acute cough:  Viral or bacterial infections. Chronic cough:  Infections.  Allergies.  Asthma.  Post-nasal drip.  Smoking.  Heartburn or acid reflux.  Some medicines.  Chronic lung problems (COPD).  Cancer. SYMPTOMS   Cough.  Fever.  Chest pain.  Increased breathing rate.  High-pitched whistling sound when breathing (wheezing).  Colored mucus that you cough up (sputum). TREATMENT   A bacterial cough may be treated with antibiotic medicine.  A viral cough must run its course and will not respond to antibiotics.  Your caregiver may recommend other treatments if you have a chronic cough. HOME CARE INSTRUCTIONS   Only take over-the-counter or prescription medicines for pain, discomfort, or fever as directed by your caregiver. Use cough suppressants only as directed by your caregiver.  Use a cold steam vaporizer or humidifier in your bedroom or home to help loosen secretions.  Sleep in a semi-upright position if your cough is worse at night.  Rest as needed.  Stop smoking if you smoke. SEEK IMMEDIATE MEDICAL CARE IF:   You have pus in your sputum.  Your cough starts to worsen.  You cannot control your cough with suppressants and are losing sleep.  You begin coughing up blood.  You have difficulty breathing.  You develop pain which is getting worse or is uncontrolled with medicine.  You have a fever. MAKE SURE YOU:   Understand these instructions.  Will watch your condition.  Will get help right away if you are not doing well or get worse. Document Released: 02/08/2011 Document Revised: 11/04/2011 Document Reviewed: 02/08/2011 ExitCare Patient Information 2015 [employer]. This information is not intended  to replace advice given to you by your health care provider. Make sure you discuss any questions you have with your health care provider.  

## 2015-04-19 ENCOUNTER — Ambulatory Visit (INDEPENDENT_AMBULATORY_CARE_PROVIDER_SITE_OTHER)
Admission: RE | Admit: 2015-04-19 | Discharge: 2015-04-19 | Disposition: A | Discharge status: Home / Self Care | Payer: Medicare Other | Source: Ambulatory Visit | Attending: Internal Medicine | Admitting: Internal Medicine

## 2015-04-19 ENCOUNTER — Other Ambulatory Visit (INDEPENDENT_AMBULATORY_CARE_PROVIDER_SITE_OTHER): Payer: Medicare Other

## 2015-04-19 ENCOUNTER — Other Ambulatory Visit: Payer: Self-pay | Admitting: *Deleted

## 2015-04-19 ENCOUNTER — Encounter: Payer: Self-pay | Admitting: Internal Medicine

## 2015-04-19 ENCOUNTER — Ambulatory Visit (INDEPENDENT_AMBULATORY_CARE_PROVIDER_SITE_OTHER): Payer: Medicare Other | Admitting: Internal Medicine

## 2015-04-19 VITALS — BP 100/56 | HR 52 | Ht 62.5 in | Wt 225.0 lb

## 2015-04-19 DIAGNOSIS — I509 Heart failure, unspecified: Secondary | ICD-10-CM

## 2015-04-19 DIAGNOSIS — R062 Wheezing: Secondary | ICD-10-CM

## 2015-04-19 DIAGNOSIS — K31819 Angiodysplasia of stomach and duodenum without bleeding: Secondary | ICD-10-CM

## 2015-04-19 DIAGNOSIS — I517 Cardiomegaly: Secondary | ICD-10-CM | POA: Diagnosis not present

## 2015-04-19 DIAGNOSIS — J9811 Atelectasis: Secondary | ICD-10-CM | POA: Diagnosis not present

## 2015-04-19 DIAGNOSIS — R0602 Shortness of breath: Secondary | ICD-10-CM

## 2015-04-19 DIAGNOSIS — K7469 Other cirrhosis of liver: Secondary | ICD-10-CM | POA: Diagnosis not present

## 2015-04-19 DIAGNOSIS — D508 Other iron deficiency anemias: Secondary | ICD-10-CM

## 2015-04-19 DIAGNOSIS — J811 Chronic pulmonary edema: Secondary | ICD-10-CM | POA: Diagnosis not present

## 2015-04-19 LAB — COMPREHENSIVE METABOLIC PANEL
ALT: 25 U/L (ref 0–35)
AST: 27 U/L (ref 0–37)
Albumin: 3.1 g/dL — ABNORMAL LOW (ref 3.5–5.2)
Alkaline Phosphatase: 85 U/L (ref 39–117)
BUN: 28 mg/dL — AB (ref 6–23)
CHLORIDE: 101 meq/L (ref 96–112)
CO2: 35 mEq/L — ABNORMAL HIGH (ref 19–32)
CREATININE: 1.01 mg/dL (ref 0.40–1.20)
Calcium: 8.8 mg/dL (ref 8.4–10.5)
GFR: 57.31 mL/min — ABNORMAL LOW (ref >60.00)
GLUCOSE: 282 mg/dL — AB (ref 70–99)
Potassium: 4.1 mEq/L (ref 3.5–5.1)
SODIUM: 140 meq/L (ref 135–145)
TOTAL PROTEIN: 5.7 g/dL — AB (ref 6.0–8.3)
Total Bilirubin: 1.1 mg/dL (ref 0.2–1.2)

## 2015-04-19 LAB — CBC WITH DIFFERENTIAL/PLATELET
Basophils Absolute: 0 10*3/uL (ref 0.0–0.1)
Basophils Relative: 1.4 % (ref 0.0–3.0)
EOS ABS: 0.2 10*3/uL (ref 0.0–0.7)
Eosinophils Relative: 5.3 % — ABNORMAL HIGH (ref 0.0–5.0)
HCT: 25.4 % — ABNORMAL LOW (ref 36.0–46.0)
LYMPHS ABS: 0.3 10*3/uL — AB (ref 0.7–4.0)
Lymphocytes Relative: 9.2 % — ABNORMAL LOW (ref 12.0–46.0)
MCHC: 33.1 g/dL (ref 30.0–36.0)
MCV: 101.8 fl — ABNORMAL HIGH (ref 78.0–100.0)
Monocytes Absolute: 0.3 10*3/uL (ref 0.1–1.0)
Monocytes Relative: 9.7 % (ref 3.0–12.0)
NEUTROS PCT: 74.4 % (ref 43.0–77.0)
Neutro Abs: 2.2 10*3/uL (ref 1.4–7.7)
Platelets: 65 10*3/uL — ABNORMAL LOW (ref 150.0–400.0)
RBC: 2.5 Mil/uL — AB (ref 3.87–5.11)
RDW: 15.8 % — AB (ref 11.5–15.5)
WBC: 3 10*3/uL — AB (ref 4.0–10.5)

## 2015-04-19 LAB — AMMONIA: Ammonia: 78 umol/L — ABNORMAL HIGH (ref 11–35)

## 2015-04-19 LAB — PROTIME-INR
INR: 1.1 ratio — AB (ref 0.8–1.0)
Prothrombin Time: 11.9 s (ref 9.6–13.1)

## 2015-04-19 MED ORDER — FUROSEMIDE 40 MG PO TABS: ORAL_TABLET | ORAL | Status: DC

## 2015-04-19 NOTE — Progress Notes (Signed)
[name] [birth year] [id number]  Note: This dictation was prepared with Dragon digital system. Any transcriptional errors that result from this procedure are unintentional.   History of Present Illness: This is a 74 year old white female with the cirrhosis, portal hypertension. Portal hypertensive gastropathy, GAVE syndrome. Chronic GI blood loss resulting in chronic anemia necessitating intermittent transfusions and iron infusions. As well as Aranesp injections. She has undergone numerous upper endoscopies with ablation of ectasias. Most recently in September 2014 and in February 2016. And in July 2016 by Dr.Hung when she presented with melenic stools. He ablated small bowel AVMs as well asl angiodysplasia in the gastric antrum. She was found to have reflux esophagitis but no evidence of esophageal varices  Last hemoglobin 8.9 hematocrit 26.9. She has history of morbid obesity. Major depression . Last colonoscopy 2008 showed adenomatous polyp.  Today patient is feeling weak and short of breath. She appears pale. She denies visible blood per rectum.  Past Medical History  Diagnosis Date  . Chronic airway obstruction, not elsewhere classified   . Unspecified chronic bronchitis   . Unspecified essential hypertension   . Non-obstructive CAD   . Palpitations   . Mild aortic stenosis     a. 03/2014 Valve area (VTI): 2.89 cm^2, Valve area (Vmax): 2.7 cm^2.  . Pure hypercholesterolemia   . Morbid obesity   . Esophageal reflux   . GAVE (gastric antral vascular ectasia)     angiodysplasia  . Diverticulosis of colon (without mention of hemorrhage)   . Benign neoplasm of colon   . Osteoarthrosis, unspecified whether generalized or localized, unspecified site   . Osteoporosis, unspecified   . Restless legs syndrome (RLS)   . Major depressive disorder, recurrent episode, severe, specified as with psychotic behavior   . Iron deficiency anemia secondary to blood loss (chronic)     a. frequent  PRBC transfusions.  . Coarse tremors     a. arms.  . Carpal tunnel syndrome on both sides   . Chronic diastolic CHF (congestive heart failure)     a. 03/26/2014 Echo: EF 55-60%, no rwma, mild AS/AI, mod-sev Ca2+ MV annulus, mildly to mod dil LA.  [name] Asthma   . Type II diabetes mellitus   . H/O hiatal hernia   . 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 dx'd 1990's  . Adenomatous colon polyp   . Midsternal chest pain     a. conservatively managed ->poor candidate for cath/anticoagulation.  [name] Anxiety   . Portal hypertensive gastropathy   . Falls frequently     completed HHPT/OT 06/2014, PT unmet goals  . Recurrent UTI (urinary tract infection)     h/o hospitalization with urosepsis 2015, but thought large component colonization/bacteriuria, only treat if symptomatic (Grapey)  . Hiatal hernia   . Thrombocytopenia   . Protein calorie malnutrition   . History of pneumonia     CAP 2015  . On home oxygen therapy     "3L; 24/7" (01/25/2015)  . OSA (obstructive sleep apnea)     a. does not use CPAP. (01/25/2015)  . GAVE (gastric antral vascular ectasia)     Past Surgical History  Procedure Laterality Date  . Hemorroidectomy    . Appendectomy    . Vaginal hysterectomy    . Breast biopsy      bilateral  . Cesarean section      x 3  . Appendectomy    . Esophagogastroduodenoscopy  06/12/2012    Procedure: ESOPHAGOGASTRODUODENOSCOPY (  EGD);  Surgeon: Lafayette Dragon, MD;  Location: [facility];  Service: Endoscopy;  Laterality: N/A;  . Balloon dilation  06/12/2012    Procedure: BALLOON DILATION;  Surgeon: Lafayette Dragon, MD;  Location: [facility];  Service: Endoscopy;  Laterality: N/A;  ?balloon  . Esophagogastroduodenoscopy  09/10/2012    Procedure: ESOPHAGOGASTRODUODENOSCOPY (EGD);  Surgeon: Lafayette Dragon, MD;  Location: [facility];  Service: Endoscopy;  Laterality: N/A;  . Hot hemostasis  09/10/2012    Procedure: HOT HEMOSTASIS (ARGON PLASMA  COAGULATION/BICAP);  Surgeon: Lafayette Dragon, MD;  Location: [facility];  Service: Endoscopy;  Laterality: N/A;  . Esophagogastroduodenoscopy N/A 01/18/2013    Procedure: ESOPHAGOGASTRODUODENOSCOPY (EGD);  Surgeon: Lafayette Dragon, MD;  Location: [facility];  Service: Endoscopy;  Laterality: N/A;  . Esophagogastroduodenoscopy N/A 05/24/2013    Procedure: ESOPHAGOGASTRODUODENOSCOPY (EGD);  Surgeon: Jerene Bears, MD;  Location: [place];  Service: Gastroenterology;  Laterality: N/A;  . Hot hemostasis N/A 05/24/2013    Procedure: HOT HEMOSTASIS (ARGON PLASMA COAGULATION/BICAP);  Surgeon: Jerene Bears, MD;  Location: [place];  Service: Gastroenterology;  Laterality: N/A;  . US echocardiography  07/2014    mild LVH, EF 60-65%, normal wall motion, mild AR, mod dilated LA, mildly dilated RA, peak PA pressure 51mmHg  . Esophagogastroduodenoscopy N/A 10/20/2014    Procedure: ESOPHAGOGASTRODUODENOSCOPY (EGD);  Surgeon: Lafayette Dragon, MD;  Location: [facility];  Service: Endoscopy;  Laterality: N/A;  . Hot hemostasis N/A 10/20/2014    Procedure: HOT HEMOSTASIS (ARGON PLASMA COAGULATION/BICAP);  Surgeon: Lafayette Dragon, MD;  Location: [facility];  Service: Endoscopy;  Laterality: N/A;  . Enteroscopy N/A 02/25/2015    Procedure: ENTEROSCOPY;  Surgeon: [name] Ada, MD;  Location: [place];  Service: Endoscopy;  Laterality: N/A;    Allergies  Allergen Reactions  . Acetaminophen Other (See Comments)    Liver dysfunction  . Aspirin Other (See Comments)    Causes nosebleeds  . Theophylline Nausea And Vomiting  . Penicillins Itching    Family history and social history have been reviewed.  Review of Systems: Positive for dyspnea. Wheezing. Weakness. Nizoral abdominal pain   The remainder of the 10 point ROS is negative except as outlined in the H&P  Physical Exam: General Appearance Well developed, in no distress, obese. Chronically ill appearing  Eyes  Non icteric  HEENT  Non traumatic,  normocephalic  Mouth No lesion, tongue papillated, no cheilosis Neck Supple without adenopathy, thyroid not enlarged, no carotid bruits,  jugular veins not visible Lungs fine inspiratory rales bilaterally. Audible expiratory wheezes , patient is on 3 L of nasal O2  COR Normal S1, normal S2, regular rhythm,  1/6ur, quiet precordium Abdomen  obese soft, nontender. No ascites. Maculopapular lesions at the waist line  Rectal  not done  Extremities   1+al edema Skin No lesions Neurological Alert and oriented x 3, no asterixis  Psychological Normal mood and affect  Assessment and Plan:   74 year old white female with the severe anemia due to low-grade GI blood loss from AVMs. She is short of breath today wheezing and has rales in her chest. We will obtain chest x-ray today. Lasix 80 mg this morning and will take 40 mg this afternoon. We will do stat electrolytes and renal function and hemoglobin. Chest x-ray will rule out congestive heart failure versus pneumonia. She was started on doxycycline 100 mg twice a day yesterday by Dr. Danise Mina. May need to adjust her Lasix depending on labs today  Cirrhosis likely due  to steatohepatitis. Portal hypertension. Gastric antral vascular ectasias. Ablated 4 weeks ago. She is at high risk for sedation. Depending on the hemoglobin today she may need repeat endoscopy with ablation but she would need a monitored anesthesia. May need blood transfusion depending on the labs today  Check venous ammonia as well as prothrombin time      [name] 04/19/2015

## 2015-04-19 NOTE — Patient Instructions (Addendum)
Your physician has requested that you go to the basement for a chest x-ray before leaving today  Your physician has requested that you go to the basement for the following lab work before leaving today:  CBC, CMET, PT INR Dr Danise Mina

## 2015-04-20 ENCOUNTER — Encounter: Payer: Self-pay | Admitting: *Deleted

## 2015-04-20 DIAGNOSIS — K7581 Nonalcoholic steatohepatitis (NASH): Secondary | ICD-10-CM | POA: Diagnosis not present

## 2015-04-20 DIAGNOSIS — J441 Chronic obstructive pulmonary disease with (acute) exacerbation: Secondary | ICD-10-CM | POA: Diagnosis not present

## 2015-04-20 DIAGNOSIS — D649 Anemia, unspecified: Secondary | ICD-10-CM | POA: Diagnosis not present

## 2015-04-20 DIAGNOSIS — F419 Anxiety disorder, unspecified: Secondary | ICD-10-CM | POA: Diagnosis not present

## 2015-04-20 DIAGNOSIS — D61818 Other pancytopenia: Secondary | ICD-10-CM | POA: Diagnosis not present

## 2015-04-20 DIAGNOSIS — I509 Heart failure, unspecified: Secondary | ICD-10-CM | POA: Diagnosis not present

## 2015-04-20 DIAGNOSIS — N390 Urinary tract infection, site not specified: Secondary | ICD-10-CM | POA: Diagnosis not present

## 2015-04-20 DIAGNOSIS — E119 Type 2 diabetes mellitus without complications: Secondary | ICD-10-CM | POA: Diagnosis not present

## 2015-04-20 NOTE — Patient Outreach (Signed)
[facility]) Care Management  04/20/2015  EVOLETT SOMARRIBA 10/31/1940 [id number]   Received phone call from [facility] at Dr. Nichola Sizer office. She reports Mrs. Maiden was seen today and her Hgb was checked and was 8.4. Dr. Olevia Perches ordered her to have 2 units of packed red blood cells on Monday and is requesting that I draw a CBC and BMET on Tuesday. I told her I would be glad to do so.  I also called Mrs. Bantz to let her know that I will be coming but she did not answer her phone I left a message.  Deloria Lair [facility] [place] [phone]

## 2015-04-22 ENCOUNTER — Other Ambulatory Visit: Payer: Self-pay | Admitting: Nurse Practitioner

## 2015-04-24 ENCOUNTER — Encounter: Payer: Self-pay | Admitting: *Deleted

## 2015-04-24 ENCOUNTER — Encounter (HOSPITAL_COMMUNITY)
Admission: RE | Admit: 2015-04-24 | Discharge: 2015-04-24 | Disposition: A | Discharge status: Home / Self Care | Payer: Medicare Other | Source: Ambulatory Visit | Attending: Internal Medicine | Admitting: Internal Medicine

## 2015-04-24 VITALS — BP 148/55 | HR 50 | Temp 98.7°F | Resp 20

## 2015-04-24 DIAGNOSIS — D508 Other iron deficiency anemias: Secondary | ICD-10-CM

## 2015-04-24 DIAGNOSIS — N189 Chronic kidney disease, unspecified: Secondary | ICD-10-CM | POA: Diagnosis not present

## 2015-04-24 DIAGNOSIS — D500 Iron deficiency anemia secondary to blood loss (chronic): Secondary | ICD-10-CM | POA: Diagnosis not present

## 2015-04-24 LAB — PREPARE RBC (CROSSMATCH)

## 2015-04-24 MED ORDER — FUROSEMIDE 10 MG/ML IJ SOLN
40.0000 mg | Freq: Once | INTRAMUSCULAR | Status: AC
  Administered 2015-04-24: 40 mg via INTRAVENOUS

## 2015-04-24 MED ORDER — SODIUM CHLORIDE 0.9 % IV SOLN: Freq: Once | INTRAVENOUS | Status: DC

## 2015-04-24 MED ORDER — FUROSEMIDE 10 MG/ML IJ SOLN
INTRAMUSCULAR | Status: AC
Start: 2015-04-24 — End: 2015-04-24
  Administered 2015-04-24: 40 mg via INTRAVENOUS
  Filled 2015-04-24: qty 4

## 2015-04-24 MED ORDER — FUROSEMIDE 10 MG/ML IJ SOLN
INTRAMUSCULAR | Status: AC
  Filled 2015-04-24: qty 4

## 2015-04-24 NOTE — Progress Notes (Signed)
Quick Note:  OK for transfusion  Please get appt with Dr. Loni Muse to see her - I think she could need another EGD/enteroscopy with ablation of GAVE ______

## 2015-04-24 NOTE — Patient Outreach (Signed)
[facility]) Care Management  04/24/2015  [name] 11-30-1940 [id number]   Pt called me on Sunday to rerport her bronchitis sxs were not getting better on doxycycline. She is taking her mucinex and nebs treatments. Her cough is worse and she is still having productive sputum.   I called in Rx: levofloxin 500 mg one po daily for seven days. I reminded pt that I would be seeing her on Tuesday am for her lab work.  Pt to receive 2 units of blood on Monday.  Deloria Lair [facility] [place] [phone]

## 2015-04-25 ENCOUNTER — Other Ambulatory Visit: Payer: Self-pay | Admitting: *Deleted

## 2015-04-25 ENCOUNTER — Telehealth: Payer: Self-pay | Admitting: Physician Assistant

## 2015-04-25 ENCOUNTER — Telehealth: Payer: Self-pay | Admitting: Pulmonary Disease

## 2015-04-25 ENCOUNTER — Other Ambulatory Visit (INDEPENDENT_AMBULATORY_CARE_PROVIDER_SITE_OTHER): Payer: Medicare Other

## 2015-04-25 ENCOUNTER — Telehealth: Payer: Self-pay

## 2015-04-25 DIAGNOSIS — D508 Other iron deficiency anemias: Secondary | ICD-10-CM

## 2015-04-25 DIAGNOSIS — D509 Iron deficiency anemia, unspecified: Secondary | ICD-10-CM | POA: Diagnosis not present

## 2015-04-25 LAB — CBC WITH DIFFERENTIAL/PLATELET
BASOS PCT: 0.5 % (ref 0.0–3.0)
Basophils Absolute: 0 10*3/uL (ref 0.0–0.1)
EOS ABS: 0.2 10*3/uL (ref 0.0–0.7)
Eosinophils Relative: 5 % (ref 0.0–5.0)
HEMATOCRIT: 31 % — AB (ref 36.0–46.0)
Hemoglobin: 10.3 g/dL — ABNORMAL LOW (ref 12.0–15.0)
LYMPHS ABS: 0.4 10*3/uL — AB (ref 0.7–4.0)
LYMPHS PCT: 9.1 % — AB (ref 12.0–46.0)
MCHC: 33.1 g/dL (ref 30.0–36.0)
MCV: 98.7 fl (ref 78.0–100.0)
Monocytes Absolute: 0.4 10*3/uL (ref 0.1–1.0)
Monocytes Relative: 9.3 % (ref 3.0–12.0)
NEUTROS ABS: 3.1 10*3/uL (ref 1.4–7.7)
NEUTROS PCT: 76.1 % (ref 43.0–77.0)
PLATELETS: 62 10*3/uL — AB (ref 150.0–400.0)
RBC: 3.14 Mil/uL — ABNORMAL LOW (ref 3.87–5.11)
RDW: 17.5 % — AB (ref 11.5–15.5)
WBC: 4.1 10*3/uL (ref 4.0–10.5)

## 2015-04-25 LAB — TYPE AND SCREEN
ABO/RH(D): O POS
ANTIBODY SCREEN: NEGATIVE
Donor AG Type: NEGATIVE
Donor AG Type: NEGATIVE
Unit division: 0
Unit division: 0

## 2015-04-25 LAB — BASIC METABOLIC PANEL
BUN: 35 mg/dL — AB (ref 6–23)
CO2: 36 mEq/L — ABNORMAL HIGH (ref 19–32)
CREATININE: 1.28 mg/dL — AB (ref 0.40–1.20)
Calcium: 8.9 mg/dL (ref 8.4–10.5)
Chloride: 100 mEq/L (ref 96–112)
GFR: 43.6 mL/min — AB (ref >60.00)
Glucose, Bld: 125 mg/dL — ABNORMAL HIGH (ref 70–99)
POTASSIUM: 3.8 meq/L (ref 3.5–5.1)
Sodium: 141 mEq/L (ref 135–145)

## 2015-04-25 LAB — HM MAMMOGRAPHY

## 2015-04-25 MED ORDER — NADOLOL 20 MG PO TABS: 10.0000 mg | ORAL_TABLET | Freq: Every day | ORAL | Status: DC

## 2015-04-25 NOTE — Patient Outreach (Signed)
[facility]) Care Management   04/25/2015  [name] May 16, 1941 [id number]  [name] is an 74 y.o. female  Subjective: Pt saw Dr. Olevia Perches last Thursday. She checked pt hgb and it was 8.4 and she ordered 2 units of blood which pt received yesterday with a dose of lasix after each unit. She says she is somewhat better since she started taking the Levaquin I ordered on Saturday because she was not improving on the doxycycline. Now she has an oral yeast infection.  Objective:   Review of Systems  HENT: Negative.   Eyes: Negative.   Respiratory: Positive for cough and shortness of breath.        Mucous has turned from green to yellow after initiation of Levaquin over weekend.     Improved from initial illness.  Cardiovascular:       Bradycardia  Gastrointestinal: Negative.   Genitourinary: Negative.   Musculoskeletal: Negative.   Skin: Positive for rash.       Oral candidiasis  Neurological: Positive for dizziness and weakness.  Endo/Heme/Allergies: Bruises/bleeds easily.  Psychiatric/Behavioral: Positive for depression.   BP 120/60 mmHg  Pulse 44  Resp 18  Wt 220 lb (99.791 kg)  SpO2 98% Temp 97.1   Physical Exam  Constitutional: She is oriented to person, place, and time. She appears well-developed and well-nourished.  HENT:  Head: Normocephalic.  Neck: Normal range of motion.  Cardiovascular: Regular rhythm.   Bradycardia 44 bpm  Respiratory: Effort normal and breath sounds normal.  GI: Soft. Bowel sounds are normal. There is tenderness.  Musculoskeletal:  stiffness  Neurological: She is alert and oriented to person, place, and time. She has normal reflexes.  Skin: Skin is warm and dry.  Candidiasis of mouth  Psychiatric: She has a normal mood and affect.    Current Medications:   Current Outpatient Prescriptions  Medication Sig Dispense Refill  . ADVAIR DISKUS 250-50 MCG/DOSE AEPB INHALE 1 PUFF INTO THE LUNGS 2 (TWO) TIMES DAILY. 60  each 4  . albuterol (PROAIR HFA) 108 (90 BASE) MCG/ACT inhaler Inhale 2 puffs into the lungs every 6 (six) hours as needed for wheezing or shortness of breath.     . ALPRAZolam (XANAX) 1 MG tablet Take 1 tablet (1 mg total) by mouth every 8 (eight) hours as needed. for anxiety 30 tablet 0  . B Complex-C (CVS B COMPLEX PLUS C) TABS TAKE ONE CAPSULE BY MOUTH EVERY DAY 30 tablet 5  . buPROPion (WELLBUTRIN XL) 150 MG 24 hr tablet Take 1 tablet (150 mg total) by mouth daily. 90 tablet 3  . clotrimazole (LOTRIMIN) 1 % cream Apply topically 2 (two) times daily as needed. 30 g 1  . darbepoetin (ARANESP) 200 MCG/0.4ML SOLN injection Inject 200 mcg into the skin every 14 (fourteen) days.    [name] doxycycline (VIBRA-TABS) 100 MG tablet Take 1 tablet (100 mg total) by mouth 2 (two) times daily. 20 tablet 0  . furosemide (LASIX) 40 MG tablet Increase Lasix to 80 mg BID x 3 days then resume current dose. 30 tablet   . guaiFENesin (MUCINEX) 600 MG 12 hr tablet Take 600 mg by mouth 2 (two) times daily. 2 tablet by mouth once daily    . Insulin Glargine (LANTUS SOLOSTAR) 100 UNIT/ML Solostar Pen Inject 120 Units into the skin every morning. 20 pen 11  . insulin lispro (HUMALOG KWIKPEN) 100 UNIT/ML KiwkPen Inject 10 Units into the skin daily with supper.    . lactulose, encephalopathy, (  CHRONULAC) 10 GM/15ML SOLN Take 30 mLs (20 g total) by mouth 2 (two) times daily. constipation (Patient taking differently: Take 20 g by mouth daily as needed (constipation). constipation) 2700 mL 6  . levalbuterol (XOPENEX) 0.63 MG/3ML nebulizer solution Take 0.63 mg by nebulization 3 (three) times daily.   5  . loratadine (CLARITIN) 10 MG tablet TAKE 1 TABLET EVERY DAY 30 tablet 6  . Multiple Vitamins-Minerals (CENTRUM SILVER PO) Take 1 tablet by mouth daily.     . nadolol (CORGARD) 20 MG tablet TAKE 1/2 TABLET BY MOUTH DAILY 15 tablet 1  . oxyCODONE (OXY IR/ROXICODONE) 5 MG immediate release tablet Take 1 tablet (5 mg total) by mouth  every 6 (six) hours as needed for severe pain. 30 tablet 0  . pantoprazole (PROTONIX) 40 MG tablet Take 1 tablet (40 mg total) by mouth 2 (two) times daily. 60 tablet 6  . PRISTIQ 50 MG 24 hr tablet TAKE 3 TABLETS BY MOUTH EVERY DAY 90 tablet 1  . promethazine (PHENERGAN) 25 MG tablet Take 1 tablet (25 mg total) by mouth 2 (two) times daily as needed for nausea. 30 tablet 3  . QUEtiapine (SEROQUEL) 200 MG tablet Take 1 tablet (200 mg total) by mouth at bedtime. 30 tablet 6  . rifaximin (XIFAXAN) 550 MG TABS tablet Take 1 tablet (550 mg total) by mouth 2 (two) times daily.    [name] rOPINIRole (REQUIP) 4 MG tablet Take 1 tablet (4 mg total) by mouth at bedtime. 30 tablet 11  . Saline (OCEAN NASAL SPRAY NA) Place 1 spray into the nose 3 (three) times daily as needed (congestion).     . simvastatin (ZOCOR) 40 MG tablet TAKE 1 TABLET BY MOUTH EVERY EVENING 30 tablet 3  . spironolactone (ALDACTONE) 25 MG tablet TAKE 1/2 TABLET BY MOUTH DAILY 15 tablet 1  . Vitamin D, Ergocalciferol, (DRISDOL) 50000 UNITS CAPS capsule TAKE ONE CAPSULE WEEKLY 4 capsule 6   No current facility-administered medications for this visit.    Functional Status:   In your present state of health, do you have any difficulty performing the following activities: 04/14/2015 03/31/2015  Hearing? N N  Vision? N N  Difficulty concentrating or making decisions? N N  Walking or climbing stairs? Y Y  Dressing or bathing? N N  Doing errands, shopping? - Facilities manager and eating ? - -  Using the Toilet? - -  In the past six months, have you accidently leaked urine? - -  Do you have problems with loss of bowel control? - -  Managing your Medications? - -  Managing your Finances? - -  Housekeeping or managing your Housekeeping? - -    Fall/Depression Screening:    PHQ 2/9 Scores 04/03/2015 01/05/2015 12/19/2014 12/19/2014  PHQ - 2 Score 1 2 2 2   PHQ- 9 Score - - - 10    Assessment:   Anemia                          Resolving  Bronchitis                          Oral Candidiasis                          Neck pain  Plan: [name] labs: CBC and BMET and will take to Dr. Nichola Sizer office.           Gave Rx for diflucan 150 mg one po daily for 3 days.           Gave Rx for oxycodone hcl 5 mg one po q 6 prn moderate to severe pain. (Have instructed pt to be judicious about what she takes this pain med for and to document for me when and why she has taken it on the calendar. Have instructed her to keep this medication separate from all others and also keep her alprazolam in separate safe place so we can keep better track of these meds.) Also suggest using muscle rub like IcyHot for her neck pain.           I will see pt again next week.  THN CM Care Plan Problem One        Most Recent Value   Care Plan Problem One  Pt high risk for readmission related to multiple health conditions   Role Documenting the Problem One  Care Management [place] for Problem One  Active   THN CM Short Term Goal #1 (0-30 days)  pt will have no readmissions related to chronic health conditions within 30 days   THN CM Short Term Goal #1 Start Date  03/20/15   [facility] CM Short Term Goal #1 Met Date  04/25/15   Interventions for Short Term Goal #1  Pt called over weekend for unimproved condition and was given additional treatment.and prevented hospitalization!    [facility] CM Care Plan Problem Two        Most Recent Value   Care Plan Problem Two  Diabetes   Role Documenting the Problem Two  Care Management Coordinator   Care Plan for Problem Two  Active   THN Long Term Goal (31-90) days  Pt will have reduced average monthly glucose values over the next 90 days.   THN CM Short Term Goal #1 (0-30 days)  Pt will monitor her glucose level bid and record for the next 30 days.   THN CM Short Term Goal #1 Start Date  03/27/15   [facility] CM Short Term Goal #1 Met Date   04/25/15   Interventions for Short Term Goal #2   Pt taking  her glucose levels faithfully. Cont same.   THN CM Short Term Goal #2 (0-30 days)  Pt will have at least 50% of her glucose values under 300.   THN CM Short Term Goal #2 Met Date  04/25/15   Interventions for Short Term Goal #2  Pt had 75% of her glucose levels the last week under 300!    THN CM Care Plan Problem Three        Most Recent Value   Care Plan Problem Three  HF   Role Documenting the Problem Three  Care Management Coordinator   Care Plan for Problem Three  Active   THN CM Short Term Goal #1 (0-30 days)  Pt to weigh daily and report to NP wt gain of 3-5 lbs above current wt. 209 over next 30 days.   THN CM Short Term Goal #1 Start Date  03/27/15   Interventions for Short Term Goal #1  Goal was not met but pt has had gradual wt gain because of nutrious intake. She has also had blood transfusions. Her old baseline wt. was near 220. Pt does call if she has  sxs HF exacerbation.     [name] [facility] [phone]

## 2015-04-25 NOTE — Telephone Encounter (Signed)
We have samples of Advair, but not [facility] Spouse notified  Samples at front to pick up Nothing further needed.

## 2015-04-25 NOTE — Telephone Encounter (Signed)
I called patient to remind her of being due for a mammogram. Patient declined any information on scheduling one at the moment.

## 2015-04-25 NOTE — Telephone Encounter (Signed)
If she has a 10 mg tablet and can break it in half, ok to reduce to 5 mg QD.  I'm not sure it comes in a dose this low. If not able to get 5 mg dose >> arrange 24 hr Holter to assess HR. Richardson Dopp, PA-C   04/25/2015 4:52 PM

## 2015-04-25 NOTE — Telephone Encounter (Signed)
I cb Bradd Canary with [facility] about pt HR 44 today. She states pt is usually in the higher 40 or 50's for HR and she is just concerned today. Said pt had iron infusion yesterday and HR went to 39, though pt is doing better today. RN wants to know should we decrease Nadolol to 5 mg daily ; pt is currently on 10 mg daily. I also advised that pt saw Richardson Dopp, PA 10/2014 and was supposed to f/u in 6-8 weeks with Dr. Burt Knack; however pt had been in the hospital as well as nursing facility for awhile. I advised I will d/w Richardson Dopp, PA and cb later today, Bradd Canary RN verbalized plan of care.

## 2015-04-25 NOTE — Telephone Encounter (Signed)
New problem    Pt is bradycardiac pulse 44 and regular, current taking Nadalol 10mg , should they reduce to 5mg  or please advise what to do.

## 2015-04-25 NOTE — Telephone Encounter (Signed)
S/w Bradd Canary, RN for [facility], with recommendations for pt with the Nadolol dose. Can decrease to 5 mg. RN states pt has 20 mg tab taking 1/2. Will try to 1/4; if not able w/cb for new Rx; if not able to get 5 mg tab PA ordered 24 hour holter to assess HR

## 2015-04-25 NOTE — Telephone Encounter (Signed)
S/w Bradd Canary, RN for [facility], with recommendations for pt with the Nadolol dose. Can decrease to 5 mg. RN states pt has 20 mg tab taking 1/2. Will try to 1/4; if not able w/cb for new Rx; if not able to get 5 mg tab PA ordered 24 hour holter to assess HR

## 2015-04-26 ENCOUNTER — Other Ambulatory Visit: Payer: Self-pay | Admitting: *Deleted

## 2015-04-26 DIAGNOSIS — N183 Chronic kidney disease, stage 3 (moderate): Secondary | ICD-10-CM | POA: Diagnosis not present

## 2015-04-26 DIAGNOSIS — J9610 Chronic respiratory failure, unspecified whether with hypoxia or hypercapnia: Secondary | ICD-10-CM | POA: Diagnosis not present

## 2015-04-26 DIAGNOSIS — F329 Major depressive disorder, single episode, unspecified: Secondary | ICD-10-CM | POA: Diagnosis not present

## 2015-04-26 DIAGNOSIS — J441 Chronic obstructive pulmonary disease with (acute) exacerbation: Secondary | ICD-10-CM | POA: Diagnosis not present

## 2015-04-26 DIAGNOSIS — F419 Anxiety disorder, unspecified: Secondary | ICD-10-CM | POA: Diagnosis not present

## 2015-04-26 DIAGNOSIS — D509 Iron deficiency anemia, unspecified: Secondary | ICD-10-CM

## 2015-04-26 DIAGNOSIS — G2581 Restless legs syndrome: Secondary | ICD-10-CM | POA: Diagnosis not present

## 2015-04-26 DIAGNOSIS — J069 Acute upper respiratory infection, unspecified: Secondary | ICD-10-CM | POA: Diagnosis not present

## 2015-04-26 DIAGNOSIS — I5031 Acute diastolic (congestive) heart failure: Secondary | ICD-10-CM | POA: Diagnosis not present

## 2015-04-26 DIAGNOSIS — R2689 Other abnormalities of gait and mobility: Secondary | ICD-10-CM | POA: Diagnosis not present

## 2015-04-26 DIAGNOSIS — M6281 Muscle weakness (generalized): Secondary | ICD-10-CM | POA: Diagnosis not present

## 2015-04-26 DIAGNOSIS — E1122 Type 2 diabetes mellitus with diabetic chronic kidney disease: Secondary | ICD-10-CM | POA: Diagnosis not present

## 2015-04-26 DIAGNOSIS — I129 Hypertensive chronic kidney disease with stage 1 through stage 4 chronic kidney disease, or unspecified chronic kidney disease: Secondary | ICD-10-CM | POA: Diagnosis not present

## 2015-04-26 NOTE — Patient Outreach (Signed)
Follow up phone call to report lab results: Hgb up to 10.3. Dr. Havery Moros wants another CBC next week. Our follow up appt is already scheduled for next Tuesday.   Deloria Lair [facility] [phone]

## 2015-04-28 ENCOUNTER — Encounter (HOSPITAL_COMMUNITY)
Admission: RE | Admit: 2015-04-28 | Discharge: 2015-04-28 | Disposition: A | Discharge status: Home / Self Care | Payer: Medicare Other | Source: Ambulatory Visit | Attending: Pulmonary Disease | Admitting: Pulmonary Disease

## 2015-04-28 ENCOUNTER — Telehealth: Payer: Self-pay | Admitting: *Deleted

## 2015-04-28 DIAGNOSIS — I5031 Acute diastolic (congestive) heart failure: Secondary | ICD-10-CM | POA: Diagnosis not present

## 2015-04-28 DIAGNOSIS — J441 Chronic obstructive pulmonary disease with (acute) exacerbation: Secondary | ICD-10-CM | POA: Diagnosis not present

## 2015-04-28 DIAGNOSIS — F419 Anxiety disorder, unspecified: Secondary | ICD-10-CM | POA: Diagnosis not present

## 2015-04-28 DIAGNOSIS — I129 Hypertensive chronic kidney disease with stage 1 through stage 4 chronic kidney disease, or unspecified chronic kidney disease: Secondary | ICD-10-CM | POA: Diagnosis not present

## 2015-04-28 DIAGNOSIS — N289 Disorder of kidney and ureter, unspecified: Secondary | ICD-10-CM | POA: Diagnosis not present

## 2015-04-28 DIAGNOSIS — D649 Anemia, unspecified: Secondary | ICD-10-CM | POA: Diagnosis not present

## 2015-04-28 DIAGNOSIS — N183 Chronic kidney disease, stage 3 (moderate): Secondary | ICD-10-CM | POA: Diagnosis not present

## 2015-04-28 DIAGNOSIS — M6281 Muscle weakness (generalized): Secondary | ICD-10-CM | POA: Diagnosis not present

## 2015-04-28 DIAGNOSIS — J069 Acute upper respiratory infection, unspecified: Secondary | ICD-10-CM | POA: Diagnosis not present

## 2015-04-28 DIAGNOSIS — E1122 Type 2 diabetes mellitus with diabetic chronic kidney disease: Secondary | ICD-10-CM | POA: Diagnosis not present

## 2015-04-28 DIAGNOSIS — Q2733 Arteriovenous malformation of digestive system vessel: Secondary | ICD-10-CM | POA: Diagnosis not present

## 2015-04-28 DIAGNOSIS — K7460 Unspecified cirrhosis of liver: Secondary | ICD-10-CM | POA: Diagnosis not present

## 2015-04-28 DIAGNOSIS — G2581 Restless legs syndrome: Secondary | ICD-10-CM | POA: Diagnosis not present

## 2015-04-28 DIAGNOSIS — J9610 Chronic respiratory failure, unspecified whether with hypoxia or hypercapnia: Secondary | ICD-10-CM | POA: Diagnosis not present

## 2015-04-28 DIAGNOSIS — D509 Iron deficiency anemia, unspecified: Secondary | ICD-10-CM | POA: Diagnosis not present

## 2015-04-28 DIAGNOSIS — F329 Major depressive disorder, single episode, unspecified: Secondary | ICD-10-CM | POA: Diagnosis not present

## 2015-04-28 DIAGNOSIS — R2689 Other abnormalities of gait and mobility: Secondary | ICD-10-CM | POA: Diagnosis not present

## 2015-04-28 LAB — POCT HEMOGLOBIN-HEMACUE: Hemoglobin: 10.1 g/dL — ABNORMAL LOW (ref 12.0–15.0)

## 2015-04-28 MED ORDER — DARBEPOETIN ALFA 200 MCG/0.4ML IJ SOSY
PREFILLED_SYRINGE | INTRAMUSCULAR | Status: AC
  Filled 2015-04-28: qty 0.4

## 2015-04-28 MED ORDER — DARBEPOETIN ALFA 200 MCG/0.4ML IJ SOSY
200.0000 ug | PREFILLED_SYRINGE | INTRAMUSCULAR | Status: DC
  Administered 2015-04-28: 200 ug via SUBCUTANEOUS

## 2015-04-28 NOTE — Telephone Encounter (Signed)
Lm providing verbal orders

## 2015-04-28 NOTE — Telephone Encounter (Signed)
Marlowe Kays, OT with [facility] left message with Triage. Marlowe Kays is requesting to resume OT, once this week and 2 times a week for the next 2 weeks

## 2015-04-28 NOTE — Telephone Encounter (Signed)
Ok to do this. Thank you.  

## 2015-05-01 ENCOUNTER — Other Ambulatory Visit: Payer: Self-pay | Admitting: Nurse Practitioner

## 2015-05-02 ENCOUNTER — Encounter: Payer: Self-pay | Admitting: *Deleted

## 2015-05-02 ENCOUNTER — Encounter: Payer: Self-pay | Admitting: Gastroenterology

## 2015-05-02 ENCOUNTER — Other Ambulatory Visit (INDEPENDENT_AMBULATORY_CARE_PROVIDER_SITE_OTHER): Payer: Medicare Other

## 2015-05-02 ENCOUNTER — Other Ambulatory Visit: Payer: Self-pay | Admitting: *Deleted

## 2015-05-02 DIAGNOSIS — D509 Iron deficiency anemia, unspecified: Secondary | ICD-10-CM

## 2015-05-02 DIAGNOSIS — M6281 Muscle weakness (generalized): Secondary | ICD-10-CM | POA: Diagnosis not present

## 2015-05-02 DIAGNOSIS — J069 Acute upper respiratory infection, unspecified: Secondary | ICD-10-CM | POA: Diagnosis not present

## 2015-05-02 DIAGNOSIS — J9610 Chronic respiratory failure, unspecified whether with hypoxia or hypercapnia: Secondary | ICD-10-CM

## 2015-05-02 DIAGNOSIS — J441 Chronic obstructive pulmonary disease with (acute) exacerbation: Secondary | ICD-10-CM | POA: Diagnosis not present

## 2015-05-02 DIAGNOSIS — E1122 Type 2 diabetes mellitus with diabetic chronic kidney disease: Secondary | ICD-10-CM

## 2015-05-02 DIAGNOSIS — R2689 Other abnormalities of gait and mobility: Secondary | ICD-10-CM | POA: Diagnosis not present

## 2015-05-02 LAB — CBC WITH DIFFERENTIAL/PLATELET
BASOS PCT: 0.4 % (ref 0.0–3.0)
Basophils Absolute: 0 10*3/uL (ref 0.0–0.1)
EOS PCT: 4.4 % (ref 0.0–5.0)
Eosinophils Absolute: 0.1 10*3/uL (ref 0.0–0.7)
HEMATOCRIT: 30.2 % — AB (ref 36.0–46.0)
HEMOGLOBIN: 10.1 g/dL — AB (ref 12.0–15.0)
LYMPHS PCT: 10.4 % — AB (ref 12.0–46.0)
Lymphs Abs: 0.3 10*3/uL — ABNORMAL LOW (ref 0.7–4.0)
MCHC: 33.4 g/dL (ref 30.0–36.0)
MCV: 97.9 fl (ref 78.0–100.0)
Monocytes Absolute: 0.2 10*3/uL (ref 0.1–1.0)
Monocytes Relative: 8.1 % (ref 3.0–12.0)
Neutro Abs: 2.1 10*3/uL (ref 1.4–7.7)
Neutrophils Relative %: 76.7 % (ref 43.0–77.0)
Platelets: 58 10*3/uL — ABNORMAL LOW (ref 150.0–400.0)
RBC: 3.09 Mil/uL — ABNORMAL LOW (ref 3.87–5.11)
RDW: 15.5 % (ref 11.5–15.5)
WBC: 2.7 10*3/uL — AB (ref 4.0–10.5)

## 2015-05-02 NOTE — Patient Outreach (Signed)
[place] [address] [facility]) Care Management   05/02/2015  [name] 27-Sep-1940 [id number]  [name] is an 74 y.o. female  Subjective:  Pt is reporting difficulty walking. She says it is hard to stand up and take the first step. She is having to ask for assistance to get up and walk.  Objective:   Review of Systems  Constitutional: Positive for diaphoresis.  HENT: Negative.   Eyes: Negative.   Respiratory: Positive for cough and sputum production.   Cardiovascular: Negative.   Gastrointestinal: Negative.   Genitourinary: Negative.   Skin: Negative.   Neurological: Positive for dizziness and weakness.  Endo/Heme/Allergies: Bruises/bleeds easily.  Psychiatric/Behavioral: Positive for depression. The patient is nervous/anxious.     Physical Exam  Constitutional: She appears well-developed and well-nourished.  HENT:  Head: Normocephalic.  Eyes: Pupils are equal, round, and reactive to light.    Current Medications:   Current Outpatient Prescriptions  Medication Sig Dispense Refill  . ADVAIR DISKUS 250-50 MCG/DOSE AEPB INHALE 1 PUFF INTO THE LUNGS 2 (TWO) TIMES DAILY. 60 each 4  . albuterol (PROAIR HFA) 108 (90 BASE) MCG/ACT inhaler Inhale 2 puffs into the lungs every 6 (six) hours as needed for wheezing or shortness of breath.     . ALPRAZolam (XANAX) 1 MG tablet Take 1 tablet (1 mg total) by mouth every 8 (eight) hours as needed. for anxiety 30 tablet 0  . B Complex-C (CVS B COMPLEX PLUS C) TABS TAKE ONE CAPSULE BY MOUTH EVERY DAY 30 tablet 5  . buPROPion (WELLBUTRIN XL) 150 MG 24 hr tablet Take 1 tablet (150 mg total) by mouth daily. 90 tablet 3  . clotrimazole (LOTRIMIN) 1 % cream Apply topically 2 (two) times daily as needed. 30 g 1  . darbepoetin (ARANESP) 200 MCG/0.4ML SOLN injection Inject 200 mcg into the skin every 14 (fourteen) days.    [name] doxycycline (VIBRA-TABS) 100 MG tablet Take 1 tablet (100 mg total) by mouth 2 (two) times daily. (Patient not taking:  Reported on 04/25/2015) 20 tablet 0  . furosemide (LASIX) 40 MG tablet Increase Lasix to 80 mg BID x 3 days then resume current dose. 30 tablet   . guaiFENesin (MUCINEX) 600 MG 12 hr tablet Take 600 mg by mouth 2 (two) times daily. 2 tablet by mouth once daily    . Insulin Glargine (LANTUS SOLOSTAR) 100 UNIT/ML Solostar Pen Inject 120 Units into the skin every morning. 20 pen 11  . insulin lispro (HUMALOG KWIKPEN) 100 UNIT/ML KiwkPen Inject 10 Units into the skin daily with supper.    . lactulose, encephalopathy, (CHRONULAC) 10 GM/15ML SOLN Take 30 mLs (20 g total) by mouth 2 (two) times daily. constipation (Patient taking differently: Take 20 g by mouth daily as needed (constipation). constipation) 2700 mL 6  . levalbuterol (XOPENEX) 0.63 MG/3ML nebulizer solution Take 0.63 mg by nebulization 3 (three) times daily.   5  . loratadine (CLARITIN) 10 MG tablet TAKE 1 TABLET EVERY DAY 30 tablet 6  . Multiple Vitamins-Minerals (CENTRUM SILVER PO) Take 1 tablet by mouth daily.     . nadolol (CORGARD) 20 MG tablet Take 0.5 tablets (10 mg total) by mouth daily. 15 tablet 1  . oxyCODONE (OXY IR/ROXICODONE) 5 MG immediate release tablet Take 1 tablet (5 mg total) by mouth every 6 (six) hours as needed for severe pain. 30 tablet 0  . pantoprazole (PROTONIX) 40 MG tablet Take 1 tablet (40 mg total) by mouth 2 (two) times daily. 60 tablet 6  .  PRISTIQ 50 MG 24 hr tablet TAKE 3 TABLETS BY MOUTH EVERY DAY 90 tablet 1  . promethazine (PHENERGAN) 25 MG tablet Take 1 tablet (25 mg total) by mouth 2 (two) times daily as needed for nausea. 30 tablet 3  . QUEtiapine (SEROQUEL) 200 MG tablet Take 1 tablet (200 mg total) by mouth at bedtime. 30 tablet 6  . rifaximin (XIFAXAN) 550 MG TABS tablet Take 1 tablet (550 mg total) by mouth 2 (two) times daily.    [name] rOPINIRole (REQUIP) 4 MG tablet Take 1 tablet (4 mg total) by mouth at bedtime. 30 tablet 11  . Saline (OCEAN NASAL SPRAY NA) Place 1 spray into the nose 3 (three)  times daily as needed (congestion).     . simvastatin (ZOCOR) 40 MG tablet TAKE 1 TABLET BY MOUTH EVERY EVENING 30 tablet 3  . spironolactone (ALDACTONE) 25 MG tablet TAKE 1/2 TABLET BY MOUTH DAILY 15 tablet 1  . Vitamin D, Ergocalciferol, (DRISDOL) 50000 UNITS CAPS capsule TAKE ONE CAPSULE WEEKLY 4 capsule 6   No current facility-administered medications for this visit.    Functional Status:   In your present state of health, do you have any difficulty performing the following activities: 04/14/2015 03/31/2015  Hearing? N N  Vision? N N  Difficulty concentrating or making decisions? N N  Walking or climbing stairs? Y Y  Dressing or bathing? N N  Doing errands, shopping? - Facilities manager and eating ? - -  Using the Toilet? - -  In the past six months, have you accidently leaked urine? - -  Do you have problems with loss of bowel control? - -  Managing your Medications? - -  Managing your Finances? - -  Housekeeping or managing your Housekeeping? - -    Fall/Depression Screening:    PHQ 2/9 Scores 04/03/2015 01/05/2015 12/19/2014 12/19/2014  PHQ - 2 Score 1 2 2 2   PHQ- 9 Score - - - 10    Assessment:    Plan: Drew CBC and delivered to [facility] [employer]. Office.           Safety precautions to avoid falls.            Try to stay awake during the day so you can sleep at night.               Follow diabetic diet, praised for improved efforts.

## 2015-05-03 ENCOUNTER — Telehealth: Payer: Self-pay | Admitting: Family Medicine

## 2015-05-03 ENCOUNTER — Other Ambulatory Visit: Payer: Self-pay | Admitting: *Deleted

## 2015-05-03 DIAGNOSIS — J069 Acute upper respiratory infection, unspecified: Secondary | ICD-10-CM | POA: Diagnosis not present

## 2015-05-03 DIAGNOSIS — I5031 Acute diastolic (congestive) heart failure: Secondary | ICD-10-CM | POA: Diagnosis not present

## 2015-05-03 DIAGNOSIS — I129 Hypertensive chronic kidney disease with stage 1 through stage 4 chronic kidney disease, or unspecified chronic kidney disease: Secondary | ICD-10-CM | POA: Diagnosis not present

## 2015-05-03 DIAGNOSIS — D509 Iron deficiency anemia, unspecified: Secondary | ICD-10-CM

## 2015-05-03 DIAGNOSIS — J9610 Chronic respiratory failure, unspecified whether with hypoxia or hypercapnia: Secondary | ICD-10-CM | POA: Diagnosis not present

## 2015-05-03 DIAGNOSIS — F419 Anxiety disorder, unspecified: Secondary | ICD-10-CM | POA: Diagnosis not present

## 2015-05-03 DIAGNOSIS — M6281 Muscle weakness (generalized): Secondary | ICD-10-CM | POA: Diagnosis not present

## 2015-05-03 DIAGNOSIS — G2581 Restless legs syndrome: Secondary | ICD-10-CM | POA: Diagnosis not present

## 2015-05-03 DIAGNOSIS — R2689 Other abnormalities of gait and mobility: Secondary | ICD-10-CM | POA: Diagnosis not present

## 2015-05-03 DIAGNOSIS — E1122 Type 2 diabetes mellitus with diabetic chronic kidney disease: Secondary | ICD-10-CM | POA: Diagnosis not present

## 2015-05-03 DIAGNOSIS — N183 Chronic kidney disease, stage 3 (moderate): Secondary | ICD-10-CM | POA: Diagnosis not present

## 2015-05-03 DIAGNOSIS — F329 Major depressive disorder, single episode, unspecified: Secondary | ICD-10-CM | POA: Diagnosis not present

## 2015-05-03 DIAGNOSIS — J441 Chronic obstructive pulmonary disease with (acute) exacerbation: Secondary | ICD-10-CM | POA: Diagnosis not present

## 2015-05-03 NOTE — Patient Outreach (Signed)
[name]) Care Management   05/02/15  [name] Jul 15, 1941 [id number]  [name] is an 74 y.o. female  Subjective: Pt reports she is having difficulty standing and walking. She is still getting PT / OT but for some reason she feels weaker. She also reports sleeping more than usual. She denies SOB, wt gain. She does have a productive cough with clear secreations and no fever. She is wearing her O2.  Objective: BP 100/58 mmHg  Pulse 80  Resp 20  Wt 222 lb (100.699 kg)  SpO2 98%   ROS Wears O2 continuously. Weak.  Physical Exam RRR. Pulse rate is up to 80 with reduction of nadalol to 5 mg daily.                          Lungs are clear and diminished.  Current Medications:   Current Outpatient Prescriptions  Medication Sig Dispense Refill  . ADVAIR DISKUS 250-50 MCG/DOSE AEPB INHALE 1 PUFF INTO THE LUNGS 2 (TWO) TIMES DAILY. 60 each 4  . albuterol (PROAIR HFA) 108 (90 BASE) MCG/ACT inhaler Inhale 2 puffs into the lungs every 6 (six) hours as needed for wheezing or shortness of breath.     . ALPRAZolam (XANAX) 1 MG tablet Take 1 tablet (1 mg total) by mouth every 8 (eight) hours as needed. for anxiety 30 tablet 0  . B Complex-C (CVS B COMPLEX PLUS C) TABS TAKE ONE CAPSULE BY MOUTH EVERY DAY 30 tablet 5  . buPROPion (WELLBUTRIN XL) 150 MG 24 hr tablet Take 1 tablet (150 mg total) by mouth daily. 90 tablet 3  . clotrimazole (LOTRIMIN) 1 % cream Apply topically 2 (two) times daily as needed. 30 g 1  . darbepoetin (ARANESP) 200 MCG/0.4ML SOLN injection Inject 200 mcg into the skin every 14 (fourteen) days.    . furosemide (LASIX) 40 MG tablet Increase Lasix to 80 mg BID x 3 days then resume current dose. 30 tablet   . guaiFENesin (MUCINEX) 600 MG 12 hr tablet Take 600 mg by mouth 2 (two) times daily. 2 tablet by mouth once daily    . Insulin Glargine (LANTUS SOLOSTAR) 100 UNIT/ML Solostar Pen Inject 120 Units into the skin every morning. 20 pen 11  . insulin  lispro (HUMALOG KWIKPEN) 100 UNIT/ML KiwkPen Inject 10 Units into the skin daily with supper.    . lactulose, encephalopathy, (CHRONULAC) 10 GM/15ML SOLN Take 30 mLs (20 g total) by mouth 2 (two) times daily. constipation (Patient taking differently: Take 20 g by mouth daily as needed (constipation). constipation) 2700 mL 6  . levalbuterol (XOPENEX) 0.63 MG/3ML nebulizer solution Take 0.63 mg by nebulization 3 (three) times daily.   5  . loratadine (CLARITIN) 10 MG tablet TAKE 1 TABLET EVERY DAY 30 tablet 6  . Multiple Vitamins-Minerals (CENTRUM SILVER PO) Take 1 tablet by mouth daily.     . nadolol (CORGARD) 20 MG tablet Take 0.5 tablets (10 mg total) by mouth daily. 15 tablet 1  . oxyCODONE (OXY IR/ROXICODONE) 5 MG immediate release tablet Take 1 tablet (5 mg total) by mouth every 6 (six) hours as needed for severe pain. 30 tablet 0  . pantoprazole (PROTONIX) 40 MG tablet Take 1 tablet (40 mg total) by mouth 2 (two) times daily. 60 tablet 6  . PRISTIQ 50 MG 24 hr tablet TAKE 3 TABLETS BY MOUTH EVERY DAY 90 tablet 1  . promethazine (PHENERGAN) 25 MG tablet Take 1  tablet (25 mg total) by mouth 2 (two) times daily as needed for nausea. 30 tablet 3  . QUEtiapine (SEROQUEL) 200 MG tablet Take 1 tablet (200 mg total) by mouth at bedtime. 30 tablet 6  . rifaximin (XIFAXAN) 550 MG TABS tablet Take 1 tablet (550 mg total) by mouth 2 (two) times daily.    [name] rOPINIRole (REQUIP) 4 MG tablet Take 1 tablet (4 mg total) by mouth at bedtime. 30 tablet 11  . Saline (OCEAN NASAL SPRAY NA) Place 1 spray into the nose 3 (three) times daily as needed (congestion).     . simvastatin (ZOCOR) 40 MG tablet TAKE 1 TABLET BY MOUTH EVERY EVENING 30 tablet 3  . spironolactone (ALDACTONE) 25 MG tablet TAKE 1/2 TABLET BY MOUTH DAILY 15 tablet 1  . Vitamin D, Ergocalciferol, (DRISDOL) 50000 UNITS CAPS capsule TAKE ONE CAPSULE WEEKLY 4 capsule 6  . doxycycline (VIBRA-TABS) 100 MG tablet Take 1 tablet (100 mg total) by mouth 2  (two) times daily. (Patient not taking: Reported on 04/25/2015) 20 tablet 0   No current facility-administered medications for this visit.    Functional Status:   In your present state of health, do you have any difficulty performing the following activities: 04/14/2015 03/31/2015  Hearing? N N  Vision? N N  Difficulty concentrating or making decisions? N N  Walking or climbing stairs? Y Y  Dressing or bathing? N N  Doing errands, shopping? - Facilities manager and eating ? - -  Using the Toilet? - -  In the past six months, have you accidently leaked urine? - -  Do you have problems with loss of bowel control? - -  Managing your Medications? - -  Managing your Finances? - -  Housekeeping or managing your Housekeeping? - -    Fall/Depression Screening:    PHQ 2/9 Scores 04/03/2015 01/05/2015 12/19/2014 12/19/2014  PHQ - 2 Score 1 2 2 2   PHQ- 9 Score - - - 10    Assessment:  Weakness                          HF, COPD, Diabetes, Anemia  Plan: Draw CBC per Dr. Havery Moros a ([name] MD) and deliver to [facility] lab.           Safety precautions discussion.            Reinforced chronic illness management.             [facility] CM Care Plan Problem One        Most Recent Value   Care Plan Problem One  Pt high risk for readmission related to multiple health conditions   Role Documenting the Problem One  Care Management [place] for Problem One  Active   THN Long Term Goal (31-90 days)  Pt will not readmit in 90 days.   THN Long Term Goal Start Date  05/02/15   Interventions for Problem One Long Term Goal  Reinforced calling NP or MD for any problems. Will visit pt every 2 weeks.    THN CM Care Plan Problem Two        Most Recent Value   Care Plan Problem Two  Diabetes   Role Documenting the Problem Two  Care Management Coordinator   Care Plan for Problem Two  Active   Interventions for Problem Two Long Term Goal   Coaxed and cajoled to reduce her carb intake.  Praised for her   <50 % under 250 mg/DL of all values this 2 weeks.   THN Long Term Goal (31-90) days  Pt will have reduced average monthly glucose values over the next 90 days.     Deloria Lair [facility] [place] [phone]

## 2015-05-03 NOTE — Telephone Encounter (Signed)
Left message on number listed for Danielle and notified her to return my call.

## 2015-05-03 NOTE — Telephone Encounter (Signed)
[name] From [facility] called  Pt bp is high it is 148/60 She is not feeling well,  since Saturday she is having a lot of weakness cb number is [phone] Thanks

## 2015-05-05 DIAGNOSIS — J449 Chronic obstructive pulmonary disease, unspecified: Secondary | ICD-10-CM | POA: Diagnosis not present

## 2015-05-05 DIAGNOSIS — N183 Chronic kidney disease, stage 3 (moderate): Secondary | ICD-10-CM | POA: Diagnosis not present

## 2015-05-05 DIAGNOSIS — F419 Anxiety disorder, unspecified: Secondary | ICD-10-CM | POA: Diagnosis not present

## 2015-05-05 DIAGNOSIS — F329 Major depressive disorder, single episode, unspecified: Secondary | ICD-10-CM | POA: Diagnosis not present

## 2015-05-05 DIAGNOSIS — M6281 Muscle weakness (generalized): Secondary | ICD-10-CM | POA: Diagnosis not present

## 2015-05-05 DIAGNOSIS — G2581 Restless legs syndrome: Secondary | ICD-10-CM | POA: Diagnosis not present

## 2015-05-05 DIAGNOSIS — I5031 Acute diastolic (congestive) heart failure: Secondary | ICD-10-CM | POA: Diagnosis not present

## 2015-05-05 DIAGNOSIS — E1122 Type 2 diabetes mellitus with diabetic chronic kidney disease: Secondary | ICD-10-CM | POA: Diagnosis not present

## 2015-05-05 DIAGNOSIS — D509 Iron deficiency anemia, unspecified: Secondary | ICD-10-CM | POA: Diagnosis not present

## 2015-05-05 DIAGNOSIS — R2689 Other abnormalities of gait and mobility: Secondary | ICD-10-CM | POA: Diagnosis not present

## 2015-05-05 DIAGNOSIS — I129 Hypertensive chronic kidney disease with stage 1 through stage 4 chronic kidney disease, or unspecified chronic kidney disease: Secondary | ICD-10-CM | POA: Diagnosis not present

## 2015-05-05 DIAGNOSIS — J441 Chronic obstructive pulmonary disease with (acute) exacerbation: Secondary | ICD-10-CM | POA: Diagnosis not present

## 2015-05-05 DIAGNOSIS — J069 Acute upper respiratory infection, unspecified: Secondary | ICD-10-CM | POA: Diagnosis not present

## 2015-05-05 DIAGNOSIS — J9610 Chronic respiratory failure, unspecified whether with hypoxia or hypercapnia: Secondary | ICD-10-CM | POA: Diagnosis not present

## 2015-05-08 ENCOUNTER — Encounter (HOSPITAL_COMMUNITY): Payer: Self-pay | Admitting: Emergency Medicine

## 2015-05-08 ENCOUNTER — Telehealth: Payer: Self-pay

## 2015-05-08 ENCOUNTER — Emergency Department (HOSPITAL_COMMUNITY)
Admission: EM | Admit: 2015-05-08 | Discharge: 2015-05-08 | Disposition: A | Discharge status: Home / Self Care | Payer: Medicare Other | Attending: Emergency Medicine | Admitting: Emergency Medicine

## 2015-05-08 ENCOUNTER — Telehealth: Payer: Self-pay | Admitting: Family Medicine

## 2015-05-08 ENCOUNTER — Emergency Department (HOSPITAL_COMMUNITY): Payer: Medicare Other

## 2015-05-08 ENCOUNTER — Other Ambulatory Visit: Payer: Self-pay | Admitting: Nurse Practitioner

## 2015-05-08 DIAGNOSIS — R531 Weakness: Secondary | ICD-10-CM | POA: Diagnosis not present

## 2015-05-08 DIAGNOSIS — M6281 Muscle weakness (generalized): Secondary | ICD-10-CM | POA: Insufficient documentation

## 2015-05-08 DIAGNOSIS — F419 Anxiety disorder, unspecified: Secondary | ICD-10-CM | POA: Diagnosis not present

## 2015-05-08 DIAGNOSIS — I10 Essential (primary) hypertension: Secondary | ICD-10-CM | POA: Diagnosis not present

## 2015-05-08 DIAGNOSIS — Z9981 Dependence on supplemental oxygen: Secondary | ICD-10-CM | POA: Diagnosis not present

## 2015-05-08 DIAGNOSIS — J069 Acute upper respiratory infection, unspecified: Secondary | ICD-10-CM | POA: Diagnosis not present

## 2015-05-08 DIAGNOSIS — I5032 Chronic diastolic (congestive) heart failure: Secondary | ICD-10-CM | POA: Insufficient documentation

## 2015-05-08 DIAGNOSIS — K219 Gastro-esophageal reflux disease without esophagitis: Secondary | ICD-10-CM | POA: Diagnosis not present

## 2015-05-08 DIAGNOSIS — E780 Pure hypercholesterolemia: Secondary | ICD-10-CM | POA: Insufficient documentation

## 2015-05-08 DIAGNOSIS — Z79899 Other long term (current) drug therapy: Secondary | ICD-10-CM | POA: Insufficient documentation

## 2015-05-08 DIAGNOSIS — Z862 Personal history of diseases of the blood and blood-forming organs and certain disorders involving the immune mechanism: Secondary | ICD-10-CM | POA: Insufficient documentation

## 2015-05-08 DIAGNOSIS — R2689 Other abnormalities of gait and mobility: Secondary | ICD-10-CM | POA: Diagnosis not present

## 2015-05-08 DIAGNOSIS — Z794 Long term (current) use of insulin: Secondary | ICD-10-CM | POA: Diagnosis not present

## 2015-05-08 DIAGNOSIS — E119 Type 2 diabetes mellitus without complications: Secondary | ICD-10-CM | POA: Diagnosis not present

## 2015-05-08 DIAGNOSIS — Z9181 History of falling: Secondary | ICD-10-CM | POA: Diagnosis not present

## 2015-05-08 DIAGNOSIS — R509 Fever, unspecified: Secondary | ICD-10-CM | POA: Diagnosis not present

## 2015-05-08 DIAGNOSIS — J441 Chronic obstructive pulmonary disease with (acute) exacerbation: Secondary | ICD-10-CM | POA: Diagnosis not present

## 2015-05-08 DIAGNOSIS — D509 Iron deficiency anemia, unspecified: Secondary | ICD-10-CM | POA: Diagnosis not present

## 2015-05-08 DIAGNOSIS — E1122 Type 2 diabetes mellitus with diabetic chronic kidney disease: Secondary | ICD-10-CM | POA: Diagnosis not present

## 2015-05-08 DIAGNOSIS — Z86018 Personal history of other benign neoplasm: Secondary | ICD-10-CM | POA: Diagnosis not present

## 2015-05-08 DIAGNOSIS — N183 Chronic kidney disease, stage 3 (moderate): Secondary | ICD-10-CM | POA: Diagnosis not present

## 2015-05-08 DIAGNOSIS — Z87440 Personal history of urinary (tract) infections: Secondary | ICD-10-CM | POA: Diagnosis not present

## 2015-05-08 DIAGNOSIS — J9610 Chronic respiratory failure, unspecified whether with hypoxia or hypercapnia: Secondary | ICD-10-CM | POA: Diagnosis not present

## 2015-05-08 DIAGNOSIS — I5031 Acute diastolic (congestive) heart failure: Secondary | ICD-10-CM | POA: Diagnosis not present

## 2015-05-08 DIAGNOSIS — R404 Transient alteration of awareness: Secondary | ICD-10-CM | POA: Diagnosis not present

## 2015-05-08 DIAGNOSIS — G2581 Restless legs syndrome: Secondary | ICD-10-CM | POA: Diagnosis not present

## 2015-05-08 DIAGNOSIS — Z87891 Personal history of nicotine dependence: Secondary | ICD-10-CM | POA: Insufficient documentation

## 2015-05-08 DIAGNOSIS — Z8701 Personal history of pneumonia (recurrent): Secondary | ICD-10-CM | POA: Insufficient documentation

## 2015-05-08 DIAGNOSIS — R110 Nausea: Secondary | ICD-10-CM | POA: Diagnosis not present

## 2015-05-08 DIAGNOSIS — R51 Headache: Secondary | ICD-10-CM | POA: Diagnosis present

## 2015-05-08 DIAGNOSIS — R0602 Shortness of breath: Secondary | ICD-10-CM | POA: Diagnosis not present

## 2015-05-08 DIAGNOSIS — F329 Major depressive disorder, single episode, unspecified: Secondary | ICD-10-CM | POA: Diagnosis not present

## 2015-05-08 DIAGNOSIS — Z8669 Personal history of other diseases of the nervous system and sense organs: Secondary | ICD-10-CM | POA: Diagnosis not present

## 2015-05-08 DIAGNOSIS — I129 Hypertensive chronic kidney disease with stage 1 through stage 4 chronic kidney disease, or unspecified chronic kidney disease: Secondary | ICD-10-CM | POA: Diagnosis not present

## 2015-05-08 LAB — URINE MICROSCOPIC-ADD ON

## 2015-05-08 LAB — COMPREHENSIVE METABOLIC PANEL
ALT: 36 U/L (ref 14–54)
AST: 46 U/L — AB (ref 15–41)
Albumin: 3.5 g/dL (ref 3.5–5.0)
Alkaline Phosphatase: 100 U/L (ref 38–126)
Anion gap: 9 (ref 5–15)
BUN: 27 mg/dL — AB (ref 6–20)
CHLORIDE: 98 mmol/L — AB (ref 101–111)
CO2: 32 mmol/L (ref 22–32)
CREATININE: 1.15 mg/dL — AB (ref 0.44–1.00)
Calcium: 9.3 mg/dL (ref 8.9–10.3)
GFR calc Af Amer: 54 mL/min — ABNORMAL LOW (ref >60)
GFR calc non Af Amer: 47 mL/min — ABNORMAL LOW (ref >60)
Glucose, Bld: 143 mg/dL — ABNORMAL HIGH (ref 65–99)
Potassium: 3.9 mmol/L (ref 3.5–5.1)
SODIUM: 139 mmol/L (ref 135–145)
Total Bilirubin: 1.3 mg/dL — ABNORMAL HIGH (ref 0.3–1.2)
Total Protein: 6 g/dL — ABNORMAL LOW (ref 6.5–8.1)

## 2015-05-08 LAB — URINALYSIS, ROUTINE W REFLEX MICROSCOPIC
Bilirubin Urine: NEGATIVE
GLUCOSE, UA: NEGATIVE mg/dL
HGB URINE DIPSTICK: NEGATIVE
Ketones, ur: NEGATIVE mg/dL
Nitrite: NEGATIVE
PH: 7 (ref 5.0–8.0)
Protein, ur: NEGATIVE mg/dL
SPECIFIC GRAVITY, URINE: 1.009 (ref 1.005–1.030)
Urobilinogen, UA: 1 mg/dL (ref 0.0–1.0)

## 2015-05-08 LAB — CBC WITH DIFFERENTIAL/PLATELET
Basophils Absolute: 0 10*3/uL (ref 0.0–0.1)
Basophils Relative: 0 % (ref 0–1)
EOS PCT: 4 % (ref 0–5)
Eosinophils Absolute: 0.2 10*3/uL (ref 0.0–0.7)
HCT: 34.5 % — ABNORMAL LOW (ref 36.0–46.0)
Hemoglobin: 10.9 g/dL — ABNORMAL LOW (ref 12.0–15.0)
LYMPHS ABS: 0.4 10*3/uL — AB (ref 0.7–4.0)
LYMPHS PCT: 7 % — AB (ref 12–46)
MCH: 31.6 pg (ref 26.0–34.0)
MCHC: 31.6 g/dL (ref 30.0–36.0)
MCV: 100 fL (ref 78.0–100.0)
MONO ABS: 0.4 10*3/uL (ref 0.1–1.0)
MONOS PCT: 8 % (ref 3–12)
Neutro Abs: 3.8 10*3/uL (ref 1.7–7.7)
Neutrophils Relative %: 80 % — ABNORMAL HIGH (ref 43–77)
PLATELETS: 72 10*3/uL — AB (ref 150–400)
RBC: 3.45 MIL/uL — AB (ref 3.87–5.11)
RDW: 15.2 % (ref 11.5–15.5)
WBC: 4.8 10*3/uL (ref 4.0–10.5)

## 2015-05-08 LAB — CBG MONITORING, ED
GLUCOSE-CAPILLARY: 131 mg/dL — AB (ref 65–99)
Glucose-Capillary: 58 mg/dL — ABNORMAL LOW (ref 65–99)

## 2015-05-08 NOTE — ED Notes (Signed)
Placed pt on 3L [place]

## 2015-05-08 NOTE — Telephone Encounter (Signed)
[facility] Patient Name: [name] DOB: Dec 14, 1940 Initial Comment Caller states she is out of breath, headache. Nurse Assessment Nurse: Mechele Dawley, RN, Amy Date/Time Eilene Ghazi Time): 05/08/2015 2:13:23 PM Confirm and document reason for call. If symptomatic, describe symptoms. ---CALLER STATES THIS MORNING WHEN SHE WAS HAVING PT AND OT SHE STARTED GETTING OUT OF BREATH. TEMP 99.5. BLOOD PRESSURE WAS ELEVATED AS WELL. NO PAIN. HER LEGS ARE REALLY WORN OUT. SHE HAS LEGS PROBLEMS. SHE IS ON OXYGEN 24/7. SHE IS HAVING A HARD TIME TALKING. Has the patient traveled out of the country within the last 30 days? ---Not Applicable Does the patient require triage? ---Yes Related visit to physician within the last 2 weeks? ---Yes Does the PT have any chronic conditions? (i.e. diabetes, asthma, etc.) ---Yes List chronic conditions. ---CHF, COPD, Guidelines Guideline Title Affirmed Question Affirmed Notes Cough - Chronic Difficulty breathing (Exception: no change from usual, chronic shortness of breath) Final Disposition User Go to ED Now (or PCP triage) Mechele Dawley, RN, [facility] - ED Disagree/Comply: Comply

## 2015-05-08 NOTE — Telephone Encounter (Signed)
Message left for patient to return my call.  

## 2015-05-08 NOTE — ED Notes (Signed)
Patient stated feels like her sugar is low and has not ate since breakfast. Took CBG result 58 Doctor notified gave orange juice. Patient stated feels better.

## 2015-05-08 NOTE — ED Notes (Signed)
Pt from home this am started having HA, generalized weakness and fever 99.5. Pt unable to take tylenol. Pt pale. CBG 155. Bp 130/78, HR 64, RR 20 98% 4L. Pt on home o2,.

## 2015-05-08 NOTE — ED Provider Notes (Signed)
CSN: [id number]     Arrival date & time 05/08/15  1629 History   First MD Initiated Contact with Patient 05/08/15 2208     Chief Complaint  Patient presents with  . Headache  . Fever     (Consider location/radiation/quality/duration/timing/severity/associated sxs/prior Treatment) HPI Comments: Generalized weakness for a long time "Fever" on Friday Here last August for deliruim/UTI Chronic cough with COPD, asthma, CHF Leg swelling, but weights less than usual SOB worsening for months, Friday worse Therapy today, had good session 3LO2 at baseline    Patient is a 74 y.o. female presenting with fever and general illness.  Fever Associated symptoms: headaches and nausea   Associated symptoms: no chest pain, no cough (no change from chronic COPD cough, sputum yellow=green which it alternates chronically), no diarrhea, no ear pain, no rash, no rhinorrhea, no sore throat and no vomiting   Illness Location:  Generalized weakness Severity:  Severe Onset quality:  Gradual Timing:  Constant Progression:  Worsening Chronicity:  Recurrent Associated symptoms: fatigue, fever (99), headaches, nausea and shortness of breath   Associated symptoms: no abdominal pain, no chest pain, no cough (no change from chronic COPD cough, sputum yellow=green which it alternates chronically), no diarrhea, no ear pain, no loss of consciousness, no rash, no rhinorrhea, no sore throat, no vomiting and no wheezing     Past Medical History  Diagnosis Date  . Chronic airway obstruction, not elsewhere classified   . Unspecified chronic bronchitis   . Unspecified essential hypertension   . Non-obstructive CAD   . Palpitations   . Mild aortic stenosis     a. 03/2014 Valve area (VTI): 2.89 cm^2, Valve area (Vmax): 2.7 cm^2.  . Pure hypercholesterolemia   . Morbid obesity   . Esophageal reflux   . GAVE (gastric antral vascular ectasia)     angiodysplasia  . Diverticulosis of colon (without mention of  hemorrhage)   . Benign neoplasm of colon   . Osteoarthrosis, unspecified whether generalized or localized, unspecified site   . Osteoporosis, unspecified   . Restless legs syndrome (RLS)   . Major depressive disorder, recurrent episode, severe, specified as with psychotic behavior   . Iron deficiency anemia secondary to blood loss (chronic)     a. frequent PRBC transfusions.  . Coarse tremors     a. arms.  . Carpal tunnel syndrome on both sides   . Chronic diastolic CHF (congestive heart failure)     a. 03/26/2014 Echo: EF 55-60%, no rwma, mild AS/AI, mod-sev Ca2+ MV annulus, mildly to mod dil LA.  [name] Asthma   . Type II diabetes mellitus   . H/O hiatal hernia   . 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 dx'd 1990's  . Adenomatous colon polyp   . Midsternal chest pain     a. conservatively managed ->poor candidate for cath/anticoagulation.  [name] Anxiety   . Portal hypertensive gastropathy   . Falls frequently     completed HHPT/OT 06/2014, PT unmet goals  . Recurrent UTI (urinary tract infection)     h/o hospitalization with urosepsis 2015, but thought large component colonization/bacteriuria, only treat if symptomatic ([place])  . Hiatal hernia   . Thrombocytopenia   . Protein calorie malnutrition   . History of pneumonia     CAP 2015  . On home oxygen therapy     "3L; 24/7" (01/25/2015)  . OSA (obstructive sleep apnea)     a. does not use CPAP. (01/25/2015)  .  GAVE (gastric antral vascular ectasia)    Past Surgical History  Procedure Laterality Date  . Hemorroidectomy    . Appendectomy    . Vaginal hysterectomy    . Breast biopsy      bilateral  . Cesarean section      x 3  . Appendectomy    . Esophagogastroduodenoscopy  06/12/2012    Procedure: ESOPHAGOGASTRODUODENOSCOPY (EGD);  Surgeon: Lafayette Dragon, MD;  Location: [facility];  Service: Endoscopy;  Laterality: N/A;  . Balloon dilation  06/12/2012    Procedure: BALLOON DILATION;  Surgeon:  Lafayette Dragon, MD;  Location: [facility];  Service: Endoscopy;  Laterality: N/A;  ?balloon  . Esophagogastroduodenoscopy  09/10/2012    Procedure: ESOPHAGOGASTRODUODENOSCOPY (EGD);  Surgeon: Lafayette Dragon, MD;  Location: [facility];  Service: Endoscopy;  Laterality: N/A;  . Hot hemostasis  09/10/2012    Procedure: HOT HEMOSTASIS (ARGON PLASMA COAGULATION/BICAP);  Surgeon: Lafayette Dragon, MD;  Location: [facility];  Service: Endoscopy;  Laterality: N/A;  . Esophagogastroduodenoscopy N/A 01/18/2013    Procedure: ESOPHAGOGASTRODUODENOSCOPY (EGD);  Surgeon: Lafayette Dragon, MD;  Location: [facility];  Service: Endoscopy;  Laterality: N/A;  . Esophagogastroduodenoscopy N/A 05/24/2013    Procedure: ESOPHAGOGASTRODUODENOSCOPY (EGD);  Surgeon: Jerene Bears, MD;  Location: [place];  Service: Gastroenterology;  Laterality: N/A;  . Hot hemostasis N/A 05/24/2013    Procedure: HOT HEMOSTASIS (ARGON PLASMA COAGULATION/BICAP);  Surgeon: Jerene Bears, MD;  Location: [place];  Service: Gastroenterology;  Laterality: N/A;  . US echocardiography  07/2014    mild LVH, EF 60-65%, normal wall motion, mild AR, mod dilated LA, mildly dilated RA, peak PA pressure 61mmHg  . Esophagogastroduodenoscopy N/A 10/20/2014    Procedure: ESOPHAGOGASTRODUODENOSCOPY (EGD);  Surgeon: Lafayette Dragon, MD;  Location: [facility];  Service: Endoscopy;  Laterality: N/A;  . Hot hemostasis N/A 10/20/2014    Procedure: HOT HEMOSTASIS (ARGON PLASMA COAGULATION/BICAP);  Surgeon: Lafayette Dragon, MD;  Location: [facility];  Service: Endoscopy;  Laterality: N/A;  . Enteroscopy N/A 02/25/2015    Procedure: ENTEROSCOPY;  Surgeon: [name] Ada, MD;  Location: [place] [facility] [place];  Service: Endoscopy;  Laterality: N/A;   Family History  Problem Relation Age of Onset  . Heart disease Mother   . Cervical cancer Mother   . Kidney disease Mother   . Diabetes Mother   . Breast cancer Sister   . Multiple sclerosis Sister   . Colon cancer Neg Hx    . Breast cancer Maternal Aunt     x 2  . Diabetes Father   . Diabetes Sister   . Multiple sclerosis Other   . Heart attack Mother   . Heart attack Father   . Stroke Daughter   . Stroke Daughter    Social History  Substance Use Topics  . Smoking status: Former Smoker -- 1.50 packs/day for 25 years    Types: Cigarettes    Quit date: 08/26/1992  . Smokeless tobacco: Never Used  . Alcohol Use: No     Comment: 1/61/09 "last alcoholic drink was years ago"   OB History    No data available     Review of Systems  Constitutional: Positive for fever (99) and fatigue.  HENT: Negative for ear pain, rhinorrhea and sore throat.   Eyes: Negative for visual disturbance.  Respiratory: Positive for shortness of breath. Negative for cough (no change from chronic COPD cough, sputum yellow=green which it alternates chronically) and wheezing.   Cardiovascular: Negative for chest pain.  Gastrointestinal: Positive for nausea. Negative for vomiting, abdominal pain and diarrhea.  Genitourinary: Negative for difficulty urinating.  Musculoskeletal: Negative for back pain and neck pain.  Skin: Negative for rash.  Neurological: Positive for headaches. Negative for loss of consciousness and syncope.      Allergies  Acetaminophen; Aspirin; Theophylline; and Penicillins  Home Medications   Prior to Admission medications   Medication Sig Start Date End Date Taking? Authorizing Provider  ADVAIR DISKUS 250-50 MCG/DOSE AEPB INHALE 1 PUFF INTO THE LUNGS 2 (TWO) TIMES DAILY. 03/13/15   Noralee Space, MD  albuterol (PROAIR HFA) 108 (90 BASE) MCG/ACT inhaler Inhale 2 puffs into the lungs every 6 (six) hours as needed for wheezing or shortness of breath.     Historical Provider, MD  ALPRAZolam Duanne Moron) 1 MG tablet Take 1 tablet (1 mg total) by mouth every 8 (eight) hours as needed. for anxiety 02/28/15   Barton Dubois, MD  B Complex-C ([facility] B COMPLEX PLUS C) TABS TAKE ONE CAPSULE BY MOUTH EVERY DAY 04/17/15    Ria Bush, MD  buPROPion (WELLBUTRIN XL) 150 MG 24 hr tablet Take 1 tablet (150 mg total) by mouth daily. 12/19/14   Ria Bush, MD  clotrimazole (LOTRIMIN) 1 % cream Apply topically 2 (two) times daily as needed. 04/14/15   Ria Bush, MD  darbepoetin (ARANESP) 200 MCG/0.4ML SOLN injection Inject 200 mcg into the skin every 14 (fourteen) days.    Historical Provider, MD  doxycycline (VIBRA-TABS) 100 MG tablet Take 1 tablet (100 mg total) by mouth 2 (two) times daily. Patient not taking: Reported on 04/25/2015 04/18/15   Jearld Fenton, NP  furosemide (LASIX) 40 MG tablet Increase Lasix to 80 mg BID x 3 days then resume current dose. 04/19/15   Lafayette Dragon, MD  guaiFENesin (MUCINEX) 600 MG 12 hr tablet Take 600 mg by mouth 2 (two) times daily. 2 tablet by mouth once daily    Historical Provider, MD  Insulin Glargine (LANTUS SOLOSTAR) 100 UNIT/ML Solostar Pen Inject 120 Units into the skin every morning. 04/13/15   Renato Shin, MD  insulin lispro (HUMALOG KWIKPEN) 100 UNIT/ML KiwkPen Inject 10 Units into the skin daily with supper.    Historical Provider, MD  lactulose, encephalopathy, (CHRONULAC) 10 GM/15ML SOLN Take 30 mLs (20 g total) by mouth 2 (two) times daily. constipation Patient taking differently: Take 20 g by mouth daily as needed (constipation). constipation 07/14/14   Lori P Hvozdovic, PA-C  levalbuterol (XOPENEX) 0.63 MG/3ML nebulizer solution Take 0.63 mg by nebulization 3 (three) times daily.  06/05/14   Historical Provider, MD  loratadine (CLARITIN) 10 MG tablet TAKE 1 TABLET EVERY DAY 02/08/15   Ria Bush, MD  Multiple Vitamins-Minerals (CENTRUM SILVER PO) Take 1 tablet by mouth daily.     Historical Provider, MD  nadolol (CORGARD) 20 MG tablet Take 0.5 tablets (10 mg total) by mouth daily. 04/25/15   Liliane Shi, PA-C  oxyCODONE (OXY IR/ROXICODONE) 5 MG immediate release tablet Take 1 tablet (5 mg total) by mouth every 6 (six) hours as needed for severe pain.  02/28/15   Barton Dubois, MD  pantoprazole (PROTONIX) 40 MG tablet Take 1 tablet (40 mg total) by mouth 2 (two) times daily. 12/05/14   Ria Bush, MD  PRISTIQ 50 MG 24 hr tablet TAKE 3 TABLETS BY MOUTH EVERY DAY 11/17/14   Ria Bush, MD  promethazine (PHENERGAN) 25 MG tablet Take 1 tablet (25 mg total) by mouth 2 (two) times daily as needed  for nausea. 06/09/14   Ria Bush, MD  QUEtiapine (SEROQUEL) 200 MG tablet Take 1 tablet (200 mg total) by mouth at bedtime. 12/05/14   Ria Bush, MD  rifaximin (XIFAXAN) 550 MG TABS tablet Take 1 tablet (550 mg total) by mouth 2 (two) times daily. 02/28/15   Barton Dubois, MD  rOPINIRole (REQUIP) 4 MG tablet Take 1 tablet (4 mg total) by mouth at bedtime. 01/16/15   Ria Bush, MD  [facility] NASAL SPRAY NA) Place 1 spray into the nose 3 (three) times daily as needed (congestion).     Historical Provider, MD  simvastatin (ZOCOR) 40 MG tablet TAKE 1 TABLET BY MOUTH EVERY EVENING 11/23/14   Ria Bush, MD  spironolactone (ALDACTONE) 25 MG tablet TAKE 1/2 TABLET BY MOUTH DAILY 12/19/14   Sherren Mocha, MD  Vitamin D, Ergocalciferol, (DRISDOL) 50000 UNITS CAPS capsule TAKE ONE CAPSULE WEEKLY 04/18/15   Ria Bush, MD   BP 127/58 mmHg  Pulse 56  Temp(Src) 98.3 F (36.8 C) (Oral)  Resp 13  Ht 5' 2.5" (1.588 m)  Wt 220 lb (99.791 kg)  BMI 39.57 kg/m2  SpO2 98% Physical Exam  Constitutional: She is oriented to person, place, and time. She appears well-developed and well-nourished. No distress.  HENT:  Head: Normocephalic and atraumatic.  Mouth/Throat: No oropharyngeal exudate.  Eyes: Conjunctivae and EOM are normal. Pupils are equal, round, and reactive to light.  Neck: Normal range of motion. No JVD present.  Cardiovascular: Normal rate, regular rhythm, normal heart sounds and intact distal pulses.  Exam reveals no gallop and no friction rub.   No murmur heard. Pulmonary/Chest: Effort normal and breath sounds normal.  No respiratory distress. She has no wheezes. She has no rales.  Abdominal: Soft. She exhibits no distension. There is no tenderness. There is no guarding.  Musculoskeletal: She exhibits no edema (trace) or tenderness.  Neurological: She is alert and oriented to person, place, and time.  Skin: Skin is warm and dry. No rash noted. She is not diaphoretic. No erythema.  Nursing note and vitals reviewed.   ED Course  Procedures (including critical care time) Labs Review Labs Reviewed  COMPREHENSIVE METABOLIC PANEL - Abnormal; Notable for the following:    Chloride 98 (*)    Glucose, Bld 143 (*)    BUN 27 (*)    Creatinine, Ser 1.15 (*)    Total Protein 6.0 (*)    AST 46 (*)    Total Bilirubin 1.3 (*)    GFR calc non Af Amer 47 (*)    GFR calc Af Amer 54 (*)    All other components within normal limits  URINALYSIS, ROUTINE W REFLEX MICROSCOPIC (NOT AT [facility]) - Abnormal; Notable for the following:    Leukocytes, UA TRACE (*)    All other components within normal limits  CBC WITH DIFFERENTIAL/PLATELET - Abnormal; Notable for the following:    RBC 3.45 (*)    Hemoglobin 10.9 (*)    HCT 34.5 (*)    Platelets 72 (*)    Neutrophils Relative % 80 (*)    Lymphocytes Relative 7 (*)    Lymphs Abs 0.4 (*)    All other components within normal limits  URINE MICROSCOPIC-ADD ON - Abnormal; Notable for the following:    Bacteria, UA MANY (*)    All other components within normal limits  CBG MONITORING, ED - Abnormal; Notable for the following:    Glucose-Capillary 58 (*)    All other components within normal limits  CBG MONITORING, ED - Abnormal; Notable for the following:    Glucose-Capillary 131 (*)    All other components within normal limits    Imaging Review Dg Chest 2 View  05/08/2015   CLINICAL DATA:  Headache, generalized weakness, low grade fever, chronic shortness of breath.  EXAM: CHEST  2 VIEW  COMPARISON:  Chest x-rays dated 04/19/2015 and 12/01/2013  FINDINGS: Cardiomediastinal  silhouette is stable in size and configuration. There is stable chronic scarring in the left mid lung region and right infrahilar region. No new lung findings seen. No evidence of pneumonia. No pleural effusion. Mild degenerative change again noted within the thoracic spine. No acute osseous abnormality.  IMPRESSION: Stable chest x-ray. No evidence of acute cardiopulmonary abnormality.   Electronically Signed   By: Franki Cabot M.D.   On: 05/08/2015 17:14   I have personally reviewed and evaluated these images and lab results as part of my medical decision-making.   EKG Interpretation   Date/Time:  Monday May 08 2015 22:57:30 EDT Ventricular Rate:  29 PR Interval:  252 QRS Duration: 113 QT Interval:  480 QTC Calculation: 467 R Axis:   -32 Text Interpretation:  Sinus rhythm Prolonged PR interval Abnormal R-wave  progression, late transition Left ventricular hypertrophy Similar to prior  tracings Confirmed by [facility] MD, Rodman ([id number]) on 05/08/2015 11:07:20 PM      MDM   Final diagnoses:  Generalized weakness   74 year old female with a complicated medical history including chronic diastolic CHF, chronic respiratory failure, COPD on 3 L of oxygen, diabetes, coronary artery disease, hypertension, hypercholesterolemia, gastric antral vascular ectasia seen by GI with blood transfusion last week, autoimmune hepatitis with cirrhosis, chronic kidney disease, presents with concern of generalized weakness. Patient initially hypoglycemic to 58 on arrival and was given orange juice with an improvement of her glucose to 130s.  EKG showed a sinus rhythm similar to prior with prolonged PR interval which is present on prior EKGs. Chest x-ray showed no signs of pneumonia or acute congestive heart failure urinalysis showed no signs of infection with bacteria likely representing contamination and have been present on prior. CMP showed creatinine at baseline, electrolytes within normal limits.  CBC  showed a hemoglobin of 10.9 improved from prior.  Patient is concerned that she requires more Lasix for CHF, however did she does not have significant swelling on exam, no JVD, no pulmonary edema, does not require more oxygen than baseline and recommended follow-up with her cardiologist.  No CP to suggest ACS. She has no tachycardia, no tachypnea, no increased hypoxia and while she is chronically ill, appears well. Discussed that her sensation of generalized weakness is unlikely to be secondary to infection in the absence of true fever, no leukocytosis, no UTI, no pneumonia.  There is no anemia or electrolyte abnormality to explain her symptoms.   Do not feel she meets criteria for admission, however recommend very close PCP follow up. Pt and husband state understanding. Patient discharged in stable condition with understanding of reasons to return.      Gareth Morgan, MD 05/09/15 [phone]

## 2015-05-08 NOTE — Telephone Encounter (Signed)
[name] PT with [facility] left v/m; pt started with productive cough with yellow phlegm and low grade fever; Andee Poles thinks beginning URI but pt does not want to come to [facility] for appt. [place] request [facility].

## 2015-05-08 NOTE — Discharge Instructions (Signed)

## 2015-05-08 NOTE — Telephone Encounter (Signed)
plz get more details. Pt currently on levaquin? For what and by whom? What sxs is she currently having? I don't think NP Bradd Canary has prescribed this but could be wrong.

## 2015-05-08 NOTE — Telephone Encounter (Signed)
Marlowe Kays OT with [facility] left v/m; pt had h/a(pt took hydrocodone) when Marlowe Kays was at pts home this AM with fever 99.2; pt is presently taking levofloxacin and has 5 more pills left to take. Pt did not want to come in for appt.

## 2015-05-08 NOTE — ED Notes (Signed)
Doctor at bedside.

## 2015-05-09 DIAGNOSIS — J449 Chronic obstructive pulmonary disease, unspecified: Secondary | ICD-10-CM | POA: Diagnosis not present

## 2015-05-09 NOTE — Telephone Encounter (Signed)
Spoke with patient. She said she is still just fatigued. ER said no PNA, CHF or UTI and told her to come home and rest. Follow up scheduled with you this week.

## 2015-05-09 NOTE — Telephone Encounter (Signed)
Spoke with patient. Went to ER. Follow up scheduled.

## 2015-05-09 NOTE — Telephone Encounter (Signed)
Seen at ER last night. plz call for f/u and offer appt in office for eval.

## 2015-05-09 NOTE — Telephone Encounter (Signed)
Spoke with patient. Follow up scheduled.

## 2015-05-10 DIAGNOSIS — M6281 Muscle weakness (generalized): Secondary | ICD-10-CM | POA: Diagnosis not present

## 2015-05-10 DIAGNOSIS — G2581 Restless legs syndrome: Secondary | ICD-10-CM | POA: Diagnosis not present

## 2015-05-10 DIAGNOSIS — E1122 Type 2 diabetes mellitus with diabetic chronic kidney disease: Secondary | ICD-10-CM | POA: Diagnosis not present

## 2015-05-10 DIAGNOSIS — F329 Major depressive disorder, single episode, unspecified: Secondary | ICD-10-CM | POA: Diagnosis not present

## 2015-05-10 DIAGNOSIS — F419 Anxiety disorder, unspecified: Secondary | ICD-10-CM | POA: Diagnosis not present

## 2015-05-10 DIAGNOSIS — R2689 Other abnormalities of gait and mobility: Secondary | ICD-10-CM | POA: Diagnosis not present

## 2015-05-10 DIAGNOSIS — I5031 Acute diastolic (congestive) heart failure: Secondary | ICD-10-CM | POA: Diagnosis not present

## 2015-05-10 DIAGNOSIS — J069 Acute upper respiratory infection, unspecified: Secondary | ICD-10-CM | POA: Diagnosis not present

## 2015-05-10 DIAGNOSIS — D509 Iron deficiency anemia, unspecified: Secondary | ICD-10-CM | POA: Diagnosis not present

## 2015-05-10 DIAGNOSIS — J9610 Chronic respiratory failure, unspecified whether with hypoxia or hypercapnia: Secondary | ICD-10-CM | POA: Diagnosis not present

## 2015-05-10 DIAGNOSIS — J441 Chronic obstructive pulmonary disease with (acute) exacerbation: Secondary | ICD-10-CM | POA: Diagnosis not present

## 2015-05-10 DIAGNOSIS — N183 Chronic kidney disease, stage 3 (moderate): Secondary | ICD-10-CM | POA: Diagnosis not present

## 2015-05-10 DIAGNOSIS — I129 Hypertensive chronic kidney disease with stage 1 through stage 4 chronic kidney disease, or unspecified chronic kidney disease: Secondary | ICD-10-CM | POA: Diagnosis not present

## 2015-05-11 ENCOUNTER — Other Ambulatory Visit: Payer: Self-pay | Admitting: *Deleted

## 2015-05-11 MED ORDER — ALPRAZOLAM 0.25 MG PO TABS: 1.0000 mg | ORAL_TABLET | Freq: Three times a day (TID) | ORAL | Status: DC | PRN

## 2015-05-11 NOTE — Patient Outreach (Addendum)
[facility]) Care Management   05/11/2015  [name] 1941/04/14 [id number]  [name] is an 74 y.o. female  Subjective: Pt is experiencing visual hallucinations as a result of withdrawal from alprazolam that she hasn't had in a week. We are thinking this is what started her problems earlier this week also. She did not let me know she was out of this. She is seeing hairs growing out of her skin and bug eggs that move on her skin or on the chair arms. No one else is able to see these things. We do however see dry skin flakes. Pt denies falls but is very unsteady.  Objective:  BP 120/50 mmHg  Pulse 59  Resp 18  Wt 226 lb (102.513 kg)  SpO2 98% FBS 325  Review of Systems  Constitutional: Positive for fever.       99.5  Eyes: Negative.   Respiratory: Negative.   Cardiovascular: Negative.   Gastrointestinal: Negative.   Genitourinary: Negative.   Musculoskeletal: Negative.   Skin: Positive for rash.  Neurological: Positive for headaches.  Endo/Heme/Allergies: Bruises/bleeds easily.  Psychiatric/Behavioral: Positive for depression and hallucinations. The patient is nervous/anxious.     Physical Exam  Constitutional: She is oriented to person, place, and time. She appears well-developed and well-nourished.  HENT:  Head: Normocephalic.  Neck:  Neck and shoulder pain.  Cardiovascular: Normal rate and regular rhythm.   Murmur heard. Respiratory: Effort normal.  Diminished breath sounds.  GI: Soft. Bowel sounds are normal.  Musculoskeletal:  Decreased ROM.  Neurological: She is alert and oriented to person, place, and time.  Skin: Skin is warm and dry. Rash noted.  Some small scabs are on forearms and on her abdomen.  Psychiatric:  Abnormal psychiatric sxs today, hallucinations I believe from alprazolam withdrawal.    Current Medications:   Current Outpatient Prescriptions  Medication Sig Dispense Refill  . ADVAIR DISKUS 250-50 MCG/DOSE AEPB  INHALE 1 PUFF INTO THE LUNGS 2 (TWO) TIMES DAILY. 60 each 4  . albuterol (PROAIR HFA) 108 (90 BASE) MCG/ACT inhaler Inhale 2 puffs into the lungs every 6 (six) hours as needed for wheezing or shortness of breath.     . ALPRAZolam (XANAX) 1 MG tablet Take 1 tablet (1 mg total) by mouth every 8 (eight) hours as needed. for anxiety 30 tablet 0  . B Complex-C ([facility] B COMPLEX PLUS C) TABS TAKE ONE CAPSULE BY MOUTH EVERY DAY 30 tablet 5  . buPROPion (WELLBUTRIN XL) 150 MG 24 hr tablet Take 1 tablet (150 mg total) by mouth daily. 90 tablet 3  . clotrimazole (LOTRIMIN) 1 % cream Apply topically 2 (two) times daily as needed. 30 g 1  . darbepoetin (ARANESP) 200 MCG/0.4ML SOLN injection Inject 200 mcg into the skin every 14 (fourteen) days.    [name] doxycycline (VIBRA-TABS) 100 MG tablet Take 1 tablet (100 mg total) by mouth 2 (two) times daily. (Patient not taking: Reported on 04/25/2015) 20 tablet 0  . furosemide (LASIX) 40 MG tablet Increase Lasix to 80 mg BID x 3 days then resume current dose. 30 tablet   . guaiFENesin (MUCINEX) 600 MG 12 hr tablet Take 600 mg by mouth 2 (two) times daily. 2 tablet by mouth once daily    . Insulin Glargine (LANTUS SOLOSTAR) 100 UNIT/ML Solostar Pen Inject 120 Units into the skin every morning. 20 pen 11  . insulin lispro (HUMALOG KWIKPEN) 100 UNIT/ML KiwkPen Inject 10 Units into the skin daily with supper.    [name]  lactulose, encephalopathy, (CHRONULAC) 10 GM/15ML SOLN Take 30 mLs (20 g total) by mouth 2 (two) times daily. constipation (Patient taking differently: Take 20 g by mouth daily as needed (constipation). constipation) 2700 mL 6  . levalbuterol (XOPENEX) 0.63 MG/3ML nebulizer solution Take 0.63 mg by nebulization 3 (three) times daily.   5  . loratadine (CLARITIN) 10 MG tablet TAKE 1 TABLET EVERY DAY 30 tablet 6  . Multiple Vitamins-Minerals (CENTRUM SILVER PO) Take 1 tablet by mouth daily.     . nadolol (CORGARD) 20 MG tablet Take 0.5 tablets (10 mg total) by mouth daily.  15 tablet 1  . oxyCODONE (OXY IR/ROXICODONE) 5 MG immediate release tablet Take 1 tablet (5 mg total) by mouth every 6 (six) hours as needed for severe pain. 30 tablet 0  . pantoprazole (PROTONIX) 40 MG tablet Take 1 tablet (40 mg total) by mouth 2 (two) times daily. 60 tablet 6  . PRISTIQ 50 MG 24 hr tablet TAKE 3 TABLETS BY MOUTH EVERY DAY 90 tablet 1  . promethazine (PHENERGAN) 25 MG tablet Take 1 tablet (25 mg total) by mouth 2 (two) times daily as needed for nausea. 30 tablet 3  . QUEtiapine (SEROQUEL) 200 MG tablet Take 1 tablet (200 mg total) by mouth at bedtime. 30 tablet 6  . rifaximin (XIFAXAN) 550 MG TABS tablet Take 1 tablet (550 mg total) by mouth 2 (two) times daily.    [name] rOPINIRole (REQUIP) 4 MG tablet Take 1 tablet (4 mg total) by mouth at bedtime. 30 tablet 11  . Saline (OCEAN NASAL SPRAY NA) Place 1 spray into the nose 3 (three) times daily as needed (congestion).     . simvastatin (ZOCOR) 40 MG tablet TAKE 1 TABLET BY MOUTH EVERY EVENING 30 tablet 3  . spironolactone (ALDACTONE) 25 MG tablet TAKE 1/2 TABLET BY MOUTH DAILY 15 tablet 1  . Vitamin D, Ergocalciferol, (DRISDOL) 50000 UNITS CAPS capsule TAKE ONE CAPSULE WEEKLY 4 capsule 6   Current Facility-Administered Medications  Medication Dose Route Frequency Provider Last Rate Last Dose  . ALPRAZolam (XANAX) tablet 1 mg  1 mg Oral TID PRN Deloria Lair, NP        Functional Status:   In your present state of health, do you have any difficulty performing the following activities: 04/14/2015 03/31/2015  Hearing? N N  Vision? N N  Difficulty concentrating or making decisions? N N  Walking or climbing stairs? Y Y  Dressing or bathing? N N  Doing errands, shopping? - Facilities manager and eating ? - -  Using the Toilet? - -  In the past six months, have you accidently leaked urine? - -  Do you have problems with loss of bowel control? - -  Managing your Medications? - -  Managing your Finances? - -  Housekeeping or  managing your Housekeeping? - -    Fall/Depression Screening:    PHQ 2/9 Scores 04/03/2015 01/05/2015 12/19/2014 12/19/2014  PHQ - 2 Score 1 2 2 2   PHQ- 9 Score - - - 10    Assessment:  Withdrawal symptoms (alprazolam)                           Plan:  Electronic Rx for Alprazolam 1.0 mg one po tid prn #[facility] and husband called to pick up. Pt educated that she cannot just quit this medication, she needs to notify my when she is nearly out to avoid  these sxs. Pt usually only takes 1-2 per day and this Rx should last way beyond one month.  Encourage Primary care office to see pt rather than send to ER for minor complaints and I will rearrange my schedule to see pt for problems that need quick evalution to avoid ED visits.  Consider palliative care consult.  THN CM Care Plan Problem One        Most Recent Value   Care Plan Problem One  Pt high risk for readmission related to multiple health conditions   Role Documenting the Problem One  Care Management [place] for Problem One  Active   THN Long Term Goal (31-90 days)  Pt will not readmit in 90 days.   THN Long Term Goal Start Date  05/02/15   Interventions for Problem One Long Term Goal  Call NP for signs of problems!    THN CM Care Plan Problem Two        Most Recent Value   Care Plan Problem Two  Diabetes   Role Documenting the Problem Two  Care Management Coordinator   Care Plan for Problem Two  Active   Interventions for Problem Two Long Term Goal   Reinforced need to follow diet.   THN Long Term Goal (31-90) days  Pt will have reduced average monthly glucose values over the next 90 days.     Deloria Lair [facility] [place] [phone]

## 2015-05-12 ENCOUNTER — Encounter (HOSPITAL_COMMUNITY)
Admission: RE | Admit: 2015-05-12 | Discharge: 2015-05-12 | Disposition: A | Discharge status: Home / Self Care | Payer: Medicare Other | Source: Ambulatory Visit | Attending: Family Medicine | Admitting: Family Medicine

## 2015-05-12 ENCOUNTER — Ambulatory Visit (INDEPENDENT_AMBULATORY_CARE_PROVIDER_SITE_OTHER): Payer: Medicare Other | Admitting: Family Medicine

## 2015-05-12 ENCOUNTER — Telehealth: Payer: Self-pay

## 2015-05-12 ENCOUNTER — Encounter: Payer: Self-pay | Admitting: Family Medicine

## 2015-05-12 VITALS — BP 126/68 | HR 52 | Temp 98.3°F | Wt 222.8 lb

## 2015-05-12 DIAGNOSIS — F333 Major depressive disorder, recurrent, severe with psychotic symptoms: Secondary | ICD-10-CM

## 2015-05-12 DIAGNOSIS — K7460 Unspecified cirrhosis of liver: Secondary | ICD-10-CM | POA: Diagnosis not present

## 2015-05-12 DIAGNOSIS — D649 Anemia, unspecified: Secondary | ICD-10-CM | POA: Diagnosis not present

## 2015-05-12 DIAGNOSIS — Z23 Encounter for immunization: Secondary | ICD-10-CM

## 2015-05-12 DIAGNOSIS — Q2733 Arteriovenous malformation of digestive system vessel: Secondary | ICD-10-CM | POA: Diagnosis not present

## 2015-05-12 DIAGNOSIS — K31819 Angiodysplasia of stomach and duodenum without bleeding: Secondary | ICD-10-CM

## 2015-05-12 DIAGNOSIS — J449 Chronic obstructive pulmonary disease, unspecified: Secondary | ICD-10-CM

## 2015-05-12 DIAGNOSIS — J208 Acute bronchitis due to other specified organisms: Secondary | ICD-10-CM | POA: Insufficient documentation

## 2015-05-12 DIAGNOSIS — K5520 Angiodysplasia of colon without hemorrhage: Secondary | ICD-10-CM | POA: Diagnosis not present

## 2015-05-12 DIAGNOSIS — N289 Disorder of kidney and ureter, unspecified: Secondary | ICD-10-CM | POA: Diagnosis not present

## 2015-05-12 LAB — POCT HEMOGLOBIN-HEMACUE: HEMOGLOBIN: 10.3 g/dL — AB (ref 12.0–15.0)

## 2015-05-12 MED ORDER — DARBEPOETIN ALFA 200 MCG/0.4ML IJ SOSY: 200.0000 ug | PREFILLED_SYRINGE | INTRAMUSCULAR | Status: DC

## 2015-05-12 MED ORDER — DESVENLAFAXINE SUCCINATE ER 50 MG PO TB24: 150.0000 mg | ORAL_TABLET | Freq: Every day | ORAL | Status: DC

## 2015-05-12 NOTE — Assessment & Plan Note (Signed)
Anticipate viral respiratory infection that led to fatigue/weakness - continue supportive care as pt feels she is slowly improving with time. Multiple comorbidities increase risk of development of bacterial infection but I don't see evidence of bacterial infection today. Discussed red flags to seek urgent care over weekend. No wheezing on exam today. No abx provided today.

## 2015-05-12 NOTE — Assessment & Plan Note (Signed)
Continue regular aranesp/transfusions. Needs CBC checked Q2 wks.

## 2015-05-12 NOTE — Progress Notes (Signed)
BP 126/68 mmHg  Pulse 52  Temp(Src) 98.3 F (36.8 C) (Oral)  Wt 222 lb 12 oz (101.039 kg)   CC: ER f/u visit  Subjective:    Patient ID: [name], female    DOB: 1941-07-13, 74 y.o.   MRN: [id number]  HPI: [name] is a 74 y.o. female presenting on 05/12/2015 for Follow-up   Records reviewed. Seen on Monday 05/08/2015 at ER with gen weakness with fever (Tmax 99) and progressively worsening dyspnea thought related to hypoglycemia. Overall unrevealing evaluation. CXR stable. Stays short winded. Denies fevers. Cough intermittently productive of yellow mucous. Endorses compliance with advair 1 puff bid, albuterol . Taking delsym.  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   Seen yesterday by Deloria Lair - concern for hallucinations from alprazolam withdrawal, new alprazolam refill sent to pharmacy #[id number]. She ran out of xanax for 1 week - [facility].  Relevant past medical, surgical, family and social history reviewed and updated as indicated. Interim medical history since our last visit reviewed. Allergies and medications reviewed and updated. Current Outpatient Prescriptions on File Prior to Visit  Medication Sig  . ADVAIR DISKUS 250-50 MCG/DOSE AEPB INHALE 1 PUFF INTO THE LUNGS 2 (TWO) TIMES DAILY.  [name] albuterol (PROAIR HFA) 108 (90 BASE) MCG/ACT inhaler Inhale 2 puffs into the lungs every 6 (six) hours as needed for wheezing or shortness of breath.   . ALPRAZolam (XANAX) 1 MG tablet Take 1 tablet (1 mg total) by mouth every 8 (eight) hours as needed. for anxiety  . B Complex-C ([facility] B COMPLEX PLUS C) TABS TAKE ONE CAPSULE BY MOUTH EVERY DAY  . buPROPion (WELLBUTRIN XL) 150 MG 24 hr tablet Take 1 tablet (150 mg total) by mouth daily.  . clotrimazole (LOTRIMIN) 1 % cream Apply topically 2 (two) times daily as needed.  . darbepoetin (ARANESP) 200 MCG/0.4ML  SOLN injection Inject 200 mcg into the skin every 14 (fourteen) days.  . furosemide (LASIX) 40 MG tablet Increase Lasix to 80 mg BID x 3 days then resume current dose. (Patient taking differently: Take by mouth as directed. 60 mg in the morning and 40mg  in the afternoon)  . guaiFENesin (MUCINEX) 600 MG 12 hr tablet Take 600 mg by mouth 2 (two) times daily. 2 tablet by mouth once daily  . Insulin Glargine (LANTUS SOLOSTAR) 100 UNIT/ML Solostar Pen Inject 120 Units into the skin every morning.  . insulin lispro (HUMALOG KWIKPEN) 100 UNIT/ML KiwkPen Inject 10 Units into the skin daily with supper.  . lactulose, encephalopathy, (CHRONULAC) 10 GM/15ML SOLN Take 30 mLs (20 g total) by mouth 2 (two) times daily. constipation (Patient taking differently: Take 20 g by mouth daily as needed (constipation). constipation)  . levalbuterol (XOPENEX) 0.63 MG/3ML nebulizer solution Take 0.63 mg by nebulization 3 (three) times daily.   [name] loratadine (CLARITIN) 10 MG tablet TAKE 1 TABLET EVERY DAY  . Multiple Vitamins-Minerals (CENTRUM SILVER PO) Take 1 tablet by mouth daily.   . nadolol (CORGARD) 20 MG tablet Take 0.5 tablets (10 mg total) by mouth daily.  [name] oxyCODONE (OXY IR/ROXICODONE) 5 MG immediate release tablet Take 1 tablet (5 mg total) by mouth every 6 (six) hours as needed for severe pain.  . pantoprazole (PROTONIX) 40 MG tablet Take 1 tablet (40 mg total) by mouth 2 (two) times daily.  . promethazine (PHENERGAN) 25 MG tablet Take 1  tablet (25 mg total) by mouth 2 (two) times daily as needed for nausea.  [name] QUEtiapine (SEROQUEL) 200 MG tablet Take 1 tablet (200 mg total) by mouth at bedtime.  . rifaximin (XIFAXAN) 550 MG TABS tablet Take 1 tablet (550 mg total) by mouth 2 (two) times daily.  [name] rOPINIRole (REQUIP) 4 MG tablet Take 1 tablet (4 mg total) by mouth at bedtime.  . Saline (OCEAN NASAL SPRAY NA) Place 1 spray into the nose 3 (three) times daily as needed (congestion).   . simvastatin (ZOCOR) 40 MG  tablet TAKE 1 TABLET BY MOUTH EVERY EVENING  . spironolactone (ALDACTONE) 25 MG tablet TAKE 1/2 TABLET BY MOUTH DAILY  . Vitamin D, Ergocalciferol, (DRISDOL) 50000 UNITS CAPS capsule TAKE ONE CAPSULE WEEKLY   Current Facility-Administered Medications on File Prior to Visit  Medication  . Darbepoetin Alfa (ARANESP) injection 200 mcg    Review of Systems Per HPI unless specifically indicated above     Objective:    BP 126/68 mmHg  Pulse 52  Temp(Src) 98.3 F (36.8 C) (Oral)  Wt 222 lb 12 oz (101.039 kg)  Wt Readings from Last 3 Encounters:  05/12/15 222 lb 12 oz (101.039 kg)  05/11/15 226 lb (102.513 kg)  05/08/15 220 lb (99.791 kg)    Physical Exam  Constitutional: She appears well-developed and well-nourished. No distress.  Cardiovascular: Normal rate, regular rhythm, normal heart sounds and intact distal pulses.   No murmur heard. Pulmonary/Chest: Effort normal. No respiratory distress. She has decreased breath sounds in the right lower field. She has no wheezes. She has no rales.  Coarse rhonchi RLL  Nursing note and vitals reviewed.  Results for orders placed or performed during the hospital encounter of 05/08/15  Comprehensive metabolic panel  Result Value Ref Range   Sodium 139 135 - 145 mmol/L   Potassium 3.9 3.5 - 5.1 mmol/L   Chloride 98 (L) 101 - 111 mmol/L   CO2 32 22 - 32 mmol/L   Glucose, Bld 143 (H) 65 - 99 mg/dL   BUN 27 (H) 6 - 20 mg/dL   Creatinine, Ser 1.15 (H) 0.44 - 1.00 mg/dL   Calcium 9.3 8.9 - 10.3 mg/dL   Total Protein 6.0 (L) 6.5 - 8.1 g/dL   Albumin 3.5 3.5 - 5.0 g/dL   AST 46 (H) 15 - 41 U/L   ALT 36 14 - 54 U/L   Alkaline Phosphatase 100 38 - 126 U/L   Total Bilirubin 1.3 (H) 0.3 - 1.2 mg/dL   GFR calc non Af Amer 47 (L) >60 mL/min   GFR calc Af Amer 54 (L) >60 mL/min   Anion gap 9 5 - 15  Urinalysis, Routine w reflex microscopic (not at [facility])  Result Value Ref Range   Color, Urine YELLOW YELLOW   APPearance CLEAR CLEAR   Specific  Gravity, Urine 1.009 1.005 - 1.030   pH 7.0 5.0 - 8.0   Glucose, UA NEGATIVE NEGATIVE mg/dL   Hgb urine dipstick NEGATIVE NEGATIVE   Bilirubin Urine NEGATIVE NEGATIVE   Ketones, ur NEGATIVE NEGATIVE mg/dL   Protein, ur NEGATIVE NEGATIVE mg/dL   Urobilinogen, UA 1.0 0.0 - 1.0 mg/dL   Nitrite NEGATIVE NEGATIVE   Leukocytes, UA TRACE (A) NEGATIVE  CBC with Differential  Result Value Ref Range   WBC 4.8 4.0 - 10.5 K/uL   RBC 3.45 (L) 3.87 - 5.11 MIL/uL   Hemoglobin 10.9 (L) 12.0 - 15.0 g/dL   HCT 34.5 (L) 36.0 - 46.0 %  MCV 100.0 78.0 - 100.0 fL   MCH 31.6 26.0 - 34.0 pg   MCHC 31.6 30.0 - 36.0 g/dL   RDW 15.2 11.5 - 15.5 %   Platelets 72 (L) 150 - 400 K/uL   Neutrophils Relative % 80 (H) 43 - 77 %   Neutro Abs 3.8 1.7 - 7.7 K/uL   Lymphocytes Relative 7 (L) 12 - 46 %   Lymphs Abs 0.4 (L) 0.7 - 4.0 K/uL   Monocytes Relative 8 3 - 12 %   Monocytes Absolute 0.4 0.1 - 1.0 K/uL   Eosinophils Relative 4 0 - 5 %   Eosinophils Absolute 0.2 0.0 - 0.7 K/uL   Basophils Relative 0 0 - 1 %   Basophils Absolute 0.0 0.0 - 0.1 K/uL  Urine microscopic-add on  Result Value Ref Range   Squamous Epithelial / LPF RARE RARE   WBC, UA 3-6 <3 WBC/hpf   RBC / HPF 0-2 <3 RBC/hpf   Bacteria, UA MANY (A) RARE  POC CBG, ED  Result Value Ref Range   Glucose-Capillary 58 (L) 65 - 99 mg/dL  POC CBG, ED  Result Value Ref Range   Glucose-Capillary 131 (H) 65 - 99 mg/dL   *Note: Due to a large number of results and/or encounters for the requested time period, some results have not been displayed. A complete set of results can be found in Results Review.      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 Reports compliance with advair. Taking xopenex/albuterol prn.      Severe recurrent major depressive disorder with psychotic features    pristiq refilled today. Pt ran  out of xanax, we never received refill request. Discussed she should not stop benzodiazepines cold [place] as that could be dangerous. Pt will check with her pharmacy to make sure they are requesting refills from correct provider.      Relevant Medications   desvenlafaxine (PRISTIQ) 50 MG 24 hr tablet   Angiodysplasia of intestine    Continue regular aranesp/transfusions. Needs CBC checked Q2 wks.       GAVE (gastric antral vascular ectasia)    See above, appreciate GI care. Continue regular CBC checks at short stay Q2wk with aranesp injection if Hgb 8-10.      Acute viral bronchitis - Primary    Anticipate viral respiratory infection that led to fatigue/weakness - continue supportive care as pt feels she is slowly improving with time. Multiple comorbidities increase risk of development of bacterial infection but I don't see evidence of bacterial infection today. Discussed red flags to seek urgent care over weekend. No wheezing on exam today. No abx provided today.           Follow up plan: Return in about 2 months (around 07/12/2015), or as needed, for follow up visit.

## 2015-05-12 NOTE — Telephone Encounter (Addendum)
[name] at short stay [facility] has pt there for Aranesp inj and order reads if hgb > 10 to hold injection and pt to come back in 1 month. Today hgb 10.3; but pt had blood transfusion 10 days ago. What to do? Spoke with Dr Darnell Level and he said to recheck hgb in 2 weeks. [name] request order for rechecking hgb in 2 weeks to be faxed to [phone]. Also as FYI pt has appt to see Dr Darnell Level 05/12/15 at 2:30 pm.

## 2015-05-12 NOTE — Progress Notes (Signed)
Pre visit review using our clinic review tool, if applicable. No additional management support is needed unless otherwise documented below in the visit note. 

## 2015-05-12 NOTE — Assessment & Plan Note (Addendum)
See above, appreciate GI care. Continue regular CBC checks at short stay Q2wk with aranesp injection if Hgb 8-10.

## 2015-05-12 NOTE — Assessment & Plan Note (Signed)
pristiq refilled today. Pt ran out of xanax, we never received refill request. Discussed she should not stop benzodiazepines cold [place] as that could be dangerous. Pt will check with her pharmacy to make sure they are requesting refills from correct provider.

## 2015-05-12 NOTE — Patient Instructions (Addendum)
Flu shot today. I've refilled pristiq - continue taking 3 tablets once daily (150mg  daily).  Watch cough and shortness of breath - if worsening please let us know. Return in 2 months for follow up visit.

## 2015-05-12 NOTE — Telephone Encounter (Signed)
plz fax this order. Thanks.

## 2015-05-12 NOTE — Addendum Note (Signed)
Addended by: Royann Shivers A on: 05/12/2015 03:47 PM   Modules accepted: Orders

## 2015-05-12 NOTE — Assessment & Plan Note (Addendum)
Continue home oxygen at 3L [place]. Reports compliance with advair. Taking xopenex/albuterol prn.

## 2015-05-12 NOTE — Telephone Encounter (Signed)
Order faxed.

## 2015-05-15 ENCOUNTER — Ambulatory Visit (INDEPENDENT_AMBULATORY_CARE_PROVIDER_SITE_OTHER): Payer: Medicare Other | Admitting: Gastroenterology

## 2015-05-15 ENCOUNTER — Other Ambulatory Visit: Payer: Self-pay | Admitting: *Deleted

## 2015-05-15 ENCOUNTER — Encounter: Payer: Self-pay | Admitting: Gastroenterology

## 2015-05-15 ENCOUNTER — Other Ambulatory Visit (INDEPENDENT_AMBULATORY_CARE_PROVIDER_SITE_OTHER): Payer: Medicare Other

## 2015-05-15 VITALS — BP 104/58 | HR 56 | Ht 62.5 in | Wt 229.2 lb

## 2015-05-15 DIAGNOSIS — K7460 Unspecified cirrhosis of liver: Secondary | ICD-10-CM

## 2015-05-15 DIAGNOSIS — D649 Anemia, unspecified: Secondary | ICD-10-CM | POA: Diagnosis not present

## 2015-05-15 DIAGNOSIS — K31819 Angiodysplasia of stomach and duodenum without bleeding: Secondary | ICD-10-CM | POA: Diagnosis not present

## 2015-05-15 DIAGNOSIS — D509 Iron deficiency anemia, unspecified: Secondary | ICD-10-CM | POA: Diagnosis not present

## 2015-05-15 LAB — CBC WITH DIFFERENTIAL/PLATELET
BASOS ABS: 0 10*3/uL (ref 0.0–0.1)
Basophils Relative: 0.3 % (ref 0.0–3.0)
EOS PCT: 4.9 % (ref 0.0–5.0)
Eosinophils Absolute: 0.2 10*3/uL (ref 0.0–0.7)
HEMATOCRIT: 27.2 % — AB (ref 36.0–46.0)
Hemoglobin: 9.2 g/dL — ABNORMAL LOW (ref 12.0–15.0)
LYMPHS ABS: 0.4 10*3/uL — AB (ref 0.7–4.0)
LYMPHS PCT: 10 % — AB (ref 12.0–46.0)
MCHC: 33.7 g/dL (ref 30.0–36.0)
MCV: 97.6 fl (ref 78.0–100.0)
MONOS PCT: 11.7 % (ref 3.0–12.0)
Monocytes Absolute: 0.4 10*3/uL (ref 0.1–1.0)
NEUTROS ABS: 2.6 10*3/uL (ref 1.4–7.7)
NEUTROS PCT: 73.1 % (ref 43.0–77.0)
PLATELETS: 62 10*3/uL — AB (ref 150.0–400.0)
RBC: 2.79 Mil/uL — AB (ref 3.87–5.11)
RDW: 15.3 % (ref 11.5–15.5)
WBC: 3.6 10*3/uL — ABNORMAL LOW (ref 4.0–10.5)

## 2015-05-15 NOTE — Progress Notes (Signed)
HPI :  74 y/o female, former patient of Dr. Olevia Perches, here for follow up for NASH related cirrhosis and GAVE. Cirrhosis reportedly complicated by history of hepatic encephalopathy.   The patient's main issue in related to portal hypertension has been GAVE. She reports longstanding anemia due to this issue, and has been having blood transfusions periodically for ongoing anemia. She has had 4-5 PRBC transfusions over the year at least, and a few in recent months.  She reports her stools are usually brown but can see dark black in the stools intermittently. She reports color change is intermittent. Last PRBC transfusion was 2 weeks ago. Her most recent Hgb is stable in the 10s. She is on nadolol and takes lactulose as needed, but she does not take it routinely due to diarrhea. She states she is eating okay, no routine vomiting or abdominal pains. Regarding her functional capacity she is very limited in her ability to ambulate. She is oxygen dependant for her COPD. She walks with a walker around home, but outside of home not much ambulation, uses wheelchair She reports COPD caused her some trouble a few weeks ago, was in the ED for breathing treatment, but now back to baseline. No prior CAD / CVA. She does not take NSAIDs or anticoagulants. She denies chest pains.   Her most recent endoscopies are as follows: 02/25/15 - enteroscopy with APC of GAVE and small bowel AVMs 10/23/14 - ablation of GAVE via APC 02/08/13 - capsule endoscopy with small bowel AVMs, all small   Past Medical History  Diagnosis Date  . Chronic airway obstruction, not elsewhere classified   . Unspecified chronic bronchitis   . Unspecified essential hypertension   . Non-obstructive CAD   . Palpitations   . Mild aortic stenosis     a. 03/2014 Valve area (VTI): 2.89 cm^2, Valve area (Vmax): 2.7 cm^2.  . Pure hypercholesterolemia   . Morbid obesity   . Esophageal reflux   . GAVE (gastric antral vascular ectasia)     angiodysplasia    . Diverticulosis of colon (without mention of hemorrhage)   . Benign neoplasm of colon   . Osteoarthrosis, unspecified whether generalized or localized, unspecified site   . Osteoporosis, unspecified   . Restless legs syndrome (RLS)   . Major depressive disorder, recurrent episode, severe, specified as with psychotic behavior   . Iron deficiency anemia secondary to blood loss (chronic)     a. frequent PRBC transfusions.  . Coarse tremors     a. arms.  . Carpal tunnel syndrome on both sides   . Chronic diastolic CHF (congestive heart failure)     a. 03/26/2014 Echo: EF 55-60%, no rwma, mild AS/AI, mod-sev Ca2+ MV annulus, mildly to mod dil LA.  [name] Asthma   . Type II diabetes mellitus   . H/O hiatal hernia   . 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 dx'd 1990's  . Adenomatous colon polyp   . Midsternal chest pain     a. conservatively managed ->poor candidate for cath/anticoagulation.  [name] Anxiety   . Portal hypertensive gastropathy   . Falls frequently     completed HHPT/OT 06/2014, PT unmet goals  . Recurrent UTI (urinary tract infection)     h/o hospitalization with urosepsis 2015, but thought large component colonization/bacteriuria, only treat if symptomatic ([place])  . Hiatal hernia   . Thrombocytopenia   . Protein calorie malnutrition   . History of pneumonia     CAP  2015  . On home oxygen therapy     "3L; 24/7" (01/25/2015)  . OSA (obstructive sleep apnea)     a. does not use CPAP. (01/25/2015)  . GAVE (gastric antral vascular ectasia)      Past Surgical History  Procedure Laterality Date  . Hemorroidectomy    . Appendectomy    . Vaginal hysterectomy    . Breast biopsy      bilateral  . Cesarean section      x 3  . Appendectomy    . Esophagogastroduodenoscopy  06/12/2012    Procedure: ESOPHAGOGASTRODUODENOSCOPY (EGD);  Surgeon: Lafayette Dragon, MD;  Location: [facility];  Service: Endoscopy;  Laterality: N/A;  . Balloon dilation   06/12/2012    Procedure: BALLOON DILATION;  Surgeon: Lafayette Dragon, MD;  Location: [facility];  Service: Endoscopy;  Laterality: N/A;  ?balloon  . Esophagogastroduodenoscopy  09/10/2012    Procedure: ESOPHAGOGASTRODUODENOSCOPY (EGD);  Surgeon: Lafayette Dragon, MD;  Location: [facility];  Service: Endoscopy;  Laterality: N/A;  . Hot hemostasis  09/10/2012    Procedure: HOT HEMOSTASIS (ARGON PLASMA COAGULATION/BICAP);  Surgeon: Lafayette Dragon, MD;  Location: [facility];  Service: Endoscopy;  Laterality: N/A;  . Esophagogastroduodenoscopy N/A 01/18/2013    Procedure: ESOPHAGOGASTRODUODENOSCOPY (EGD);  Surgeon: Lafayette Dragon, MD;  Location: [facility];  Service: Endoscopy;  Laterality: N/A;  . Esophagogastroduodenoscopy N/A 05/24/2013    Procedure: ESOPHAGOGASTRODUODENOSCOPY (EGD);  Surgeon: Jerene Bears, MD;  Location: [place];  Service: Gastroenterology;  Laterality: N/A;  . Hot hemostasis N/A 05/24/2013    Procedure: HOT HEMOSTASIS (ARGON PLASMA COAGULATION/BICAP);  Surgeon: Jerene Bears, MD;  Location: [place];  Service: Gastroenterology;  Laterality: N/A;  . US echocardiography  07/2014    mild LVH, EF 60-65%, normal wall motion, mild AR, mod dilated LA, mildly dilated RA, peak PA pressure 42mmHg  . Esophagogastroduodenoscopy N/A 10/20/2014    Procedure: ESOPHAGOGASTRODUODENOSCOPY (EGD);  Surgeon: Lafayette Dragon, MD;  Location: [facility];  Service: Endoscopy;  Laterality: N/A;  . Hot hemostasis N/A 10/20/2014    Procedure: HOT HEMOSTASIS (ARGON PLASMA COAGULATION/BICAP);  Surgeon: Lafayette Dragon, MD;  Location: [facility];  Service: Endoscopy;  Laterality: N/A;  . Enteroscopy N/A 02/25/2015    Procedure: ENTEROSCOPY;  Surgeon: [name] Ada, MD;  Location: [place];  Service: Endoscopy;  Laterality: N/A;   Family History  Problem Relation Age of Onset  . Heart disease Mother   . Cervical cancer Mother   . Kidney disease Mother   . Diabetes Mother   . Breast cancer Sister   .  Multiple sclerosis Sister   . Colon cancer Neg Hx   . Breast cancer Maternal Aunt     x 2  . Diabetes Father   . Diabetes Sister   . Multiple sclerosis Other   . Heart attack Mother   . Heart attack Father   . Stroke Daughter   . Stroke Daughter    Social History  Substance Use Topics  . Smoking status: Former Smoker -- 1.50 packs/day for 25 years    Types: Cigarettes    Quit date: 08/26/1992  . Smokeless tobacco: Never Used  . Alcohol Use: No     Comment: 2/37/62 "last alcoholic drink was years ago"   Current Outpatient Prescriptions  Medication Sig Dispense Refill  . ADVAIR DISKUS 250-50 MCG/DOSE AEPB INHALE 1 PUFF INTO THE LUNGS 2 (TWO) TIMES DAILY. 60 each 4  . albuterol (PROAIR HFA) 108 (90 BASE) MCG/ACT inhaler  Inhale 2 puffs into the lungs every 6 (six) hours as needed for wheezing or shortness of breath.     . ALPRAZolam (XANAX) 1 MG tablet Take 1 tablet (1 mg total) by mouth every 8 (eight) hours as needed. for anxiety 30 tablet 0  . B Complex-C ([employer]S B COMPLEX PLUS C) TABS TAKE ONE CAPSULE BY MOUTH EVERY DAY 30 tablet 5  . buPROPion (WELLBUTRIN XL) 150 MG 24 hr tablet Take 1 tablet (150 mg total) by mouth daily. 90 tablet 3  . clotrimazole (LOTRIMIN) 1 % cream Apply topically 2 (two) times daily as needed. 30 g 1  . darbepoetin (ARANESP) 200 MCG/0.4ML SOLN injection Inject 200 mcg into the skin every 14 (fourteen) days.    [name] desvenlafaxine (PRISTIQ) 50 MG 24 hr tablet Take 3 tablets (150 mg total) by mouth daily. 90 tablet 11  . furosemide (LASIX) 40 MG tablet Increase Lasix to 80 mg BID x 3 days then resume current dose. (Patient taking differently: Take by mouth as directed. 60 mg in the morning and 40mg  in the afternoon) 30 tablet   . guaiFENesin (MUCINEX) 600 MG 12 hr tablet Take 600 mg by mouth 2 (two) times daily. 2 tablet by mouth once daily    . Insulin Glargine (LANTUS SOLOSTAR) 100 UNIT/ML Solostar Pen Inject 120 Units into the skin every morning. 20 pen 11  .  insulin lispro (HUMALOG KWIKPEN) 100 UNIT/ML KiwkPen Inject 10 Units into the skin daily with supper.    . lactulose, encephalopathy, (CHRONULAC) 10 GM/15ML SOLN Take 30 mLs (20 g total) by mouth 2 (two) times daily. constipation (Patient taking differently: Take 20 g by mouth daily as needed (constipation). constipation) 2700 mL 6  . levalbuterol (XOPENEX) 0.63 MG/3ML nebulizer solution Take 0.63 mg by nebulization 3 (three) times daily.   5  . loratadine (CLARITIN) 10 MG tablet TAKE 1 TABLET EVERY DAY 30 tablet 6  . Multiple Vitamins-Minerals (CENTRUM SILVER PO) Take 1 tablet by mouth daily.     . nadolol (CORGARD) 20 MG tablet Take 0.5 tablets (10 mg total) by mouth daily. 15 tablet 1  . oxyCODONE (OXY IR/ROXICODONE) 5 MG immediate release tablet Take 1 tablet (5 mg total) by mouth every 6 (six) hours as needed for severe pain. 30 tablet 0  . pantoprazole (PROTONIX) 40 MG tablet Take 1 tablet (40 mg total) by mouth 2 (two) times daily. 60 tablet 6  . promethazine (PHENERGAN) 25 MG tablet Take 1 tablet (25 mg total) by mouth 2 (two) times daily as needed for nausea. 30 tablet 3  . QUEtiapine (SEROQUEL) 200 MG tablet Take 1 tablet (200 mg total) by mouth at bedtime. 30 tablet 6  . rifaximin (XIFAXAN) 550 MG TABS tablet Take 1 tablet (550 mg total) by mouth 2 (two) times daily.    [name] rOPINIRole (REQUIP) 4 MG tablet Take 1 tablet (4 mg total) by mouth at bedtime. 30 tablet 11  . Saline (OCEAN NASAL SPRAY NA) Place 1 spray into the nose 3 (three) times daily as needed (congestion).     . simvastatin (ZOCOR) 40 MG tablet TAKE 1 TABLET BY MOUTH EVERY EVENING 30 tablet 3  . spironolactone (ALDACTONE) 25 MG tablet TAKE 1/2 TABLET BY MOUTH DAILY 15 tablet 1  . Vitamin D, Ergocalciferol, (DRISDOL) 50000 UNITS CAPS capsule TAKE ONE CAPSULE WEEKLY 4 capsule 6   No current facility-administered medications for this visit.   Allergies  Allergen Reactions  . Acetaminophen Other (See Comments)  Liver  dysfunction  . Aspirin Other (See Comments)    Causes nosebleeds  . Theophylline Nausea And Vomiting  . Penicillins Itching     Review of Systems: All systems reviewed and negative except where noted in HPI.   CBC, LFTs, BMP, INR reviewed - last INR 1.1, platelets 70s, Hgb stable in 10s, bili 1-2 range Last RUQ 2015 - cirrhosis no mass lesions   Physical Exam: BP 104/58 mmHg  Pulse 56  Ht 5' 2.5" (1.588 m)  Wt 229 lb 4 oz (103.987 kg)  BMI 41.24 kg/m2 Constitutional: Pleasant, female in no acute distress. Sitting in wheelchair HEENT: Normocephalic and atraumatic. Conjunctivae are normal. No scleral icterus. Neck supple.  Cardiovascular: Normal rate, regular rhythm.  Pulmonary/chest: CTA B with some scattered wheezing Abdominal: Soft, protuberant, nontender. Some vascular markings noted. Bowel sounds active throughout. There are no masses palpable.  Extremities: (+) 1 edema B LE Lymphadenopathy: No cervical adenopathy noted. Neurological: Alert and oriented to person place and time. Skin: Skin is warm and dry. No rashes noted. Psychiatric: Normal mood and affect. Behavior is normal.   ASSESSMENT AND PLAN: 74 y/o female with cirrhosis secondary to NASH, associated with GAVE and small bowel AVMs, which has led to ongoing anemia and blood transfusions. Hgb stable around 10 at this time however she has needed several transfusions over the year to keep her at this level. She has had 2 endoscopies this year for ablation of GAVE. She otherwise has significant co morbidities, include oxygen dependant COPD..  I had a lengthy discussion with Mrs. Troutman today and her sister to discuss her active issues, namely anemia / GAVE. To prevent further anemia / GI tract blood loss I offered them another enteroscopy with APC ablation. I discussed the risks / benefits of endoscopy and anesthesia with them at length. She is clearly a high risk patient in regards to her anesthesia risk, however given her  recent past we can only assume without intervention she will continue to have anemia needing transfusion. Hopefully with endoscopy we can minimize her need for transfusion and hospitalization. Ideally I would consider this every 1-2 months to stay on top of it however with her risk factors I don't know if this would be possible. For now, following our discussion, she wishes to proceed with endoscopy at this time. She will continue her nadolol as tolerated and her lactulose. She will continue present dose of diuretics. In regards to her [facility] screening, I otherwise am recommending another RUQ [place] given her last exam was over a year ago, along with AFP level. She should have her CBC checked every 2 weeks or so to see if she warrants transfusion. Further recommendations pending results of endoscopy, hopefully to be done in the next few weeks.   Dover Cellar, MD [facility] Pager [phone]

## 2015-05-15 NOTE — Patient Instructions (Signed)
You have been scheduled for an endoscopy. Please follow written instructions given to you at your visit today. If you use inhalers (even only as needed), please bring them with you on the day of your procedure. Your physician has requested that you go to www.startemmi.com and enter the access code given to you at your visit today. This web site gives a general overview about your procedure. However, you should still follow specific instructions given to you by our office regarding your preparation for the procedure.  Your physician has requested that you go to the basement for the following lab work before leaving today: AFP  You have been scheduled for an abdominal ultrasound at [facility] (1st floor of hospital) on 05-18-15 at [place]. Please arrive 15 minutes prior to your appointment for registration. Make certain not to have anything to eat or drink 6 hours prior to your appointment. Should you need to reschedule your appointment, please contact radiology at [phone]. This test typically takes about 30 minutes to perform.

## 2015-05-16 ENCOUNTER — Other Ambulatory Visit: Payer: Self-pay | Admitting: Nurse Practitioner

## 2015-05-17 ENCOUNTER — Other Ambulatory Visit: Payer: Self-pay

## 2015-05-17 ENCOUNTER — Other Ambulatory Visit: Payer: Self-pay | Admitting: Family Medicine

## 2015-05-17 DIAGNOSIS — K7460 Unspecified cirrhosis of liver: Secondary | ICD-10-CM

## 2015-05-17 NOTE — Telephone Encounter (Signed)
Ok to refill 

## 2015-05-18 ENCOUNTER — Other Ambulatory Visit: Payer: Self-pay | Admitting: *Deleted

## 2015-05-18 ENCOUNTER — Ambulatory Visit (HOSPITAL_COMMUNITY)
Admission: RE | Admit: 2015-05-18 | Discharge: 2015-05-18 | Disposition: A | Discharge status: Home / Self Care | Payer: Medicare Other | Source: Ambulatory Visit | Attending: Gastroenterology | Admitting: Gastroenterology

## 2015-05-18 ENCOUNTER — Other Ambulatory Visit (INDEPENDENT_AMBULATORY_CARE_PROVIDER_SITE_OTHER): Payer: Medicare Other

## 2015-05-18 DIAGNOSIS — K7460 Unspecified cirrhosis of liver: Secondary | ICD-10-CM | POA: Insufficient documentation

## 2015-05-18 DIAGNOSIS — K868 Other specified diseases of pancreas: Secondary | ICD-10-CM | POA: Diagnosis not present

## 2015-05-18 DIAGNOSIS — D649 Anemia, unspecified: Secondary | ICD-10-CM | POA: Insufficient documentation

## 2015-05-18 DIAGNOSIS — E119 Type 2 diabetes mellitus without complications: Secondary | ICD-10-CM | POA: Diagnosis not present

## 2015-05-18 DIAGNOSIS — D509 Iron deficiency anemia, unspecified: Secondary | ICD-10-CM

## 2015-05-18 DIAGNOSIS — K31819 Angiodysplasia of stomach and duodenum without bleeding: Secondary | ICD-10-CM | POA: Diagnosis not present

## 2015-05-18 DIAGNOSIS — K8689 Other specified diseases of pancreas: Secondary | ICD-10-CM

## 2015-05-18 DIAGNOSIS — R161 Splenomegaly, not elsewhere classified: Secondary | ICD-10-CM | POA: Diagnosis not present

## 2015-05-18 DIAGNOSIS — I10 Essential (primary) hypertension: Secondary | ICD-10-CM | POA: Insufficient documentation

## 2015-05-18 LAB — CBC WITH DIFFERENTIAL/PLATELET
BASOS ABS: 0 10*3/uL (ref 0.0–0.1)
Basophils Relative: 0.4 % (ref 0.0–3.0)
EOS ABS: 0.3 10*3/uL (ref 0.0–0.7)
Eosinophils Relative: 5.6 % — ABNORMAL HIGH (ref 0.0–5.0)
HCT: 29.5 % — ABNORMAL LOW (ref 36.0–46.0)
Hemoglobin: 9.8 g/dL — ABNORMAL LOW (ref 12.0–15.0)
LYMPHS ABS: 0.4 10*3/uL — AB (ref 0.7–4.0)
Lymphocytes Relative: 9.6 % — ABNORMAL LOW (ref 12.0–46.0)
MCHC: 33.3 g/dL (ref 30.0–36.0)
MCV: 97.7 fl (ref 78.0–100.0)
MONOS PCT: 11.7 % (ref 3.0–12.0)
Monocytes Absolute: 0.5 10*3/uL (ref 0.1–1.0)
NEUTROS PCT: 72.7 % (ref 43.0–77.0)
Neutro Abs: 3.3 10*3/uL (ref 1.4–7.7)
Platelets: 87 10*3/uL — ABNORMAL LOW (ref 150.0–400.0)
RBC: 3.02 Mil/uL — AB (ref 3.87–5.11)
RDW: 15.2 % (ref 11.5–15.5)
WBC: 4.5 10*3/uL (ref 4.0–10.5)

## 2015-05-18 LAB — AFP TUMOR MARKER

## 2015-05-19 LAB — AFP TUMOR MARKER: AFP-Tumor Marker: 1.7 ng/mL (ref <6.1)

## 2015-05-23 ENCOUNTER — Other Ambulatory Visit: Payer: Self-pay | Admitting: Cardiovascular Disease

## 2015-05-24 ENCOUNTER — Telehealth: Payer: Self-pay | Admitting: Pulmonary Disease

## 2015-05-24 ENCOUNTER — Ambulatory Visit (HOSPITAL_COMMUNITY)
Admission: RE | Admit: 2015-05-24 | Discharge: 2015-05-24 | Disposition: A | Discharge status: Home / Self Care | Payer: Medicare Other | Source: Ambulatory Visit | Attending: Gastroenterology | Admitting: Gastroenterology

## 2015-05-24 ENCOUNTER — Telehealth: Payer: Self-pay | Admitting: *Deleted

## 2015-05-24 ENCOUNTER — Other Ambulatory Visit: Payer: Self-pay | Admitting: Gastroenterology

## 2015-05-24 DIAGNOSIS — K7689 Other specified diseases of liver: Secondary | ICD-10-CM | POA: Diagnosis not present

## 2015-05-24 DIAGNOSIS — D1803 Hemangioma of intra-abdominal structures: Secondary | ICD-10-CM | POA: Diagnosis not present

## 2015-05-24 DIAGNOSIS — K7460 Unspecified cirrhosis of liver: Secondary | ICD-10-CM | POA: Diagnosis not present

## 2015-05-24 DIAGNOSIS — K868 Other specified diseases of pancreas: Secondary | ICD-10-CM | POA: Diagnosis present

## 2015-05-24 DIAGNOSIS — K766 Portal hypertension: Secondary | ICD-10-CM | POA: Diagnosis not present

## 2015-05-24 DIAGNOSIS — K8689 Other specified diseases of pancreas: Secondary | ICD-10-CM

## 2015-05-24 MED ORDER — GADOBENATE DIMEGLUMINE 529 MG/ML IV SOLN
20.0000 mL | Freq: Once | INTRAVENOUS | Status: AC | PRN
  Administered 2015-05-24: 20 mL via INTRAVENOUS

## 2015-05-24 NOTE — Telephone Encounter (Signed)
PA for Xifaxan in your IN box for completion.

## 2015-05-24 NOTE — Telephone Encounter (Addendum)
For prevention of hepatic encephalopathy - if denied will need to send to GI. Placed in Kim's box.

## 2015-05-24 NOTE — Telephone Encounter (Signed)
We do have samples at this time. Pt's husband is aware. Nothing further was needed.

## 2015-05-25 ENCOUNTER — Other Ambulatory Visit: Payer: Self-pay | Admitting: *Deleted

## 2015-05-25 NOTE — Telephone Encounter (Signed)
PA faxed. Will await determination. 

## 2015-05-25 NOTE — Patient Outreach (Signed)
[facility]) Care Management   05/25/2015  [name] 11-Sep-1940 [id number]  [name] is an 74 y.o. female  Subjective: Pt is doing fair. She is less mobile this has not changed since my last visit. She tells me it is harder to stand because of leg weakness and her pannus gets in the way. Her balance is off. She denies any falls. She is using her walker all the time. She has had an MRI of the abdomen to look at her pancreas and liver. She complains of some neck pain (right side) and R trapizius. She has only taken 10 oxycontin tabs this month. She is being very judicial about that. She denies having any further problems with her skin, no hallucinations. Her weight is stable, no SOB, minimal edema.  Objective:  BP 110/50 mmHg  Pulse 58  Resp 18  Wt 228 lb (103.42 kg)  SpO2 98% FBS 254  Review of Systems  Eyes: Negative.   Respiratory: Negative.   Cardiovascular: Negative.   Gastrointestinal: Negative.   Genitourinary: Negative.   Musculoskeletal: Positive for myalgias and neck pain.  Skin: Negative.   Neurological: Positive for weakness.  Endo/Heme/Allergies: Bruises/bleeds easily.  Psychiatric/Behavioral: Positive for depression. The patient is nervous/anxious.     Physical Exam  Constitutional: She is oriented to person, place, and time. She appears well-developed and well-nourished.  HENT:  Head: Normocephalic.  Neck: Normal range of motion.  Some neck pain on R side.  Cardiovascular: Regular rhythm and normal heart sounds.   Respiratory: Effort normal and breath sounds normal.  GI: Soft. Bowel sounds are normal.  Musculoskeletal: She exhibits edema.  Limited ROM.  Neurological: She is alert and oriented to person, place, and time.  Skin: Skin is warm and dry.  Psychiatric: She has a normal mood and affect.    Current Medications:   Current Outpatient Prescriptions  Medication Sig Dispense Refill  . ADVAIR DISKUS 250-50 MCG/DOSE AEPB  INHALE 1 PUFF INTO THE LUNGS 2 (TWO) TIMES DAILY. 60 each 4  . albuterol (PROAIR HFA) 108 (90 BASE) MCG/ACT inhaler Inhale 2 puffs into the lungs every 6 (six) hours as needed for wheezing or shortness of breath.     . ALPRAZolam (XANAX) 1 MG tablet Take 1 tablet (1 mg total) by mouth every 8 (eight) hours as needed. for anxiety 30 tablet 0  . B Complex-C (CVS B COMPLEX PLUS C) TABS TAKE ONE CAPSULE BY MOUTH EVERY DAY 30 tablet 5  . buPROPion (WELLBUTRIN XL) 150 MG 24 hr tablet Take 1 tablet (150 mg total) by mouth daily. 90 tablet 3  . clotrimazole (LOTRIMIN) 1 % cream Apply topically 2 (two) times daily as needed. 30 g 1  . darbepoetin (ARANESP) 200 MCG/0.4ML SOLN injection Inject 200 mcg into the skin every 14 (fourteen) days.    [name] desvenlafaxine (PRISTIQ) 50 MG 24 hr tablet Take 3 tablets (150 mg total) by mouth daily. 90 tablet 11  . furosemide (LASIX) 40 MG tablet Increase Lasix to 80 mg BID x 3 days then resume current dose. (Patient taking differently: Take by mouth as directed. 60 mg in the morning and 40mg  in the afternoon) 30 tablet   . guaiFENesin (MUCINEX) 600 MG 12 hr tablet Take 600 mg by mouth 2 (two) times daily. 2 tablet by mouth once daily    . Insulin Glargine (LANTUS SOLOSTAR) 100 UNIT/ML Solostar Pen Inject 120 Units into the skin every morning. 20 pen 11  . insulin lispro (  HUMALOG KWIKPEN) 100 UNIT/ML KiwkPen Inject 10 Units into the skin daily with supper.    . lactulose, encephalopathy, (CHRONULAC) 10 GM/15ML SOLN Take 30 mLs (20 g total) by mouth 2 (two) times daily. constipation (Patient taking differently: Take 20 g by mouth daily as needed (constipation). constipation) 2700 mL 6  . levalbuterol (XOPENEX) 0.63 MG/3ML nebulizer solution Take 0.63 mg by nebulization 3 (three) times daily.   5  . loratadine (CLARITIN) 10 MG tablet TAKE 1 TABLET EVERY DAY 30 tablet 6  . Multiple Vitamins-Minerals (CENTRUM SILVER PO) Take 1 tablet by mouth daily.     . nadolol (CORGARD) 20 MG  tablet Take 0.5 tablets (10 mg total) by mouth daily. 15 tablet 1  . oxyCODONE (OXY IR/ROXICODONE) 5 MG immediate release tablet Take 1 tablet (5 mg total) by mouth every 6 (six) hours as needed for severe pain. 30 tablet 0  . pantoprazole (PROTONIX) 40 MG tablet Take 1 tablet (40 mg total) by mouth 2 (two) times daily. 60 tablet 6  . promethazine (PHENERGAN) 25 MG tablet Take 1 tablet (25 mg total) by mouth 2 (two) times daily as needed for nausea. 30 tablet 3  . QUEtiapine (SEROQUEL) 200 MG tablet Take 1 tablet (200 mg total) by mouth at bedtime. 30 tablet 6  . rOPINIRole (REQUIP) 4 MG tablet TAKE 1 TABLET EVERY DAY 30 tablet 6  . Saline (OCEAN NASAL SPRAY NA) Place 1 spray into the nose 3 (three) times daily as needed (congestion).     . simvastatin (ZOCOR) 40 MG tablet TAKE 1 TABLET BY MOUTH EVERY EVENING 30 tablet 3  . spironolactone (ALDACTONE) 25 MG tablet TAKE 1/2 TABLET BY MOUTH DAILY 15 tablet 1  . Vitamin D, Ergocalciferol, (DRISDOL) 50000 UNITS CAPS capsule TAKE ONE CAPSULE WEEKLY 4 capsule 6  . spironolactone (ALDACTONE) 25 MG tablet TAKE 0.5 TABLETS (12.5 MG TOTAL) BY MOUTH DAILY. 15 tablet 0  . XIFAXAN 550 MG TABS tablet TAKE 1 TABLET TWICE A DAY (Patient not taking: Reported on 05/25/2015) 60 tablet 11   No current facility-administered medications for this visit.    Functional Status:   In your present state of health, do you have any difficulty performing the following activities: 04/14/2015 03/31/2015  Hearing? N N  Vision? N N  Difficulty concentrating or making decisions? N N  Walking or climbing stairs? Y Y  Dressing or bathing? N N  Doing errands, shopping? - Facilities manager and eating ? - -  Using the Toilet? - -  In the past six months, have you accidently leaked urine? - -  Do you have problems with loss of bowel control? - -  Managing your Medications? - -  Managing your Finances? - -  Housekeeping or managing your Housekeeping? - -    Fall/Depression  Screening:    PHQ 2/9 Scores 04/03/2015 01/05/2015 12/19/2014 12/19/2014  PHQ - 2 Score 1 2 2 2   PHQ- 9 Score - - - 10    Assessment:  NASH   COPD   HF   Anemia    Plan: I will see pt next week for her CBC with diff draw for Dr. Delsa Grana out a new MOST form. Pt is a NO CODE.          Filled out a referral for palliative care. I want to participate in this evaluation and work  with their staff.    Deloria Lair [facility] [phone]

## 2015-05-26 ENCOUNTER — Encounter (HOSPITAL_COMMUNITY)
Admission: RE | Admit: 2015-05-26 | Discharge: 2015-05-26 | Disposition: A | Discharge status: Home / Self Care | Payer: Medicare Other | Source: Ambulatory Visit | Attending: Family Medicine | Admitting: Family Medicine

## 2015-05-26 DIAGNOSIS — D649 Anemia, unspecified: Secondary | ICD-10-CM | POA: Diagnosis not present

## 2015-05-26 DIAGNOSIS — N289 Disorder of kidney and ureter, unspecified: Secondary | ICD-10-CM | POA: Diagnosis not present

## 2015-05-26 DIAGNOSIS — Q2733 Arteriovenous malformation of digestive system vessel: Secondary | ICD-10-CM | POA: Diagnosis not present

## 2015-05-26 DIAGNOSIS — K7460 Unspecified cirrhosis of liver: Secondary | ICD-10-CM | POA: Diagnosis not present

## 2015-05-26 LAB — POCT HEMOGLOBIN-HEMACUE: HEMOGLOBIN: 8.4 g/dL — AB (ref 12.0–15.0)

## 2015-05-26 MED ORDER — DARBEPOETIN ALFA 200 MCG/0.4ML IJ SOSY
200.0000 ug | PREFILLED_SYRINGE | INTRAMUSCULAR | Status: DC
  Administered 2015-05-26: 200 ug via SUBCUTANEOUS

## 2015-05-26 MED ORDER — DARBEPOETIN ALFA 200 MCG/0.4ML IJ SOSY
PREFILLED_SYRINGE | INTRAMUSCULAR | Status: AC
  Administered 2015-05-26: 200 ug via SUBCUTANEOUS
  Filled 2015-05-26: qty 0.4

## 2015-05-26 NOTE — Telephone Encounter (Signed)
PA approved. Pharmacy notified 

## 2015-05-29 ENCOUNTER — Other Ambulatory Visit: Payer: Self-pay | Admitting: Family Medicine

## 2015-05-29 ENCOUNTER — Other Ambulatory Visit: Payer: Self-pay | Admitting: *Deleted

## 2015-05-29 ENCOUNTER — Encounter: Payer: Self-pay | Admitting: *Deleted

## 2015-05-29 ENCOUNTER — Other Ambulatory Visit: Payer: Self-pay | Admitting: Nurse Practitioner

## 2015-05-29 NOTE — Patient Outreach (Signed)
Pt called and left a message for me to return the call. I called and she reported a 3-4 pound wt gain and peripheral edema that will not reduce during the night. She denies any more SOB than usual. She said today she took 80 mg of furosemide instead of her usual 40 mg and will take her usual 40 mg this afternoon. I advised her that was good and thanked her for calling to advise me. I have asked her to take 80 mg furosemide Tuesday and Wednesday. I will see pt on Thursday and [place] from [place] has agreed to meet with [place] at 1:00 pm. Pt is in agreement with this arrangement.  Deloria Lair [facility] [phone]

## 2015-06-01 ENCOUNTER — Other Ambulatory Visit (INDEPENDENT_AMBULATORY_CARE_PROVIDER_SITE_OTHER): Payer: Medicare Other

## 2015-06-01 ENCOUNTER — Other Ambulatory Visit: Payer: Self-pay | Admitting: *Deleted

## 2015-06-01 DIAGNOSIS — D509 Iron deficiency anemia, unspecified: Secondary | ICD-10-CM | POA: Diagnosis not present

## 2015-06-01 DIAGNOSIS — K31819 Angiodysplasia of stomach and duodenum without bleeding: Secondary | ICD-10-CM

## 2015-06-01 DIAGNOSIS — R52 Pain, unspecified: Secondary | ICD-10-CM | POA: Diagnosis not present

## 2015-06-01 LAB — CBC WITH DIFFERENTIAL/PLATELET
BASOS ABS: 0 10*3/uL (ref 0.0–0.1)
BASOS PCT: 0.2 % (ref 0.0–3.0)
EOS ABS: 0.2 10*3/uL (ref 0.0–0.7)
Eosinophils Relative: 4.8 % (ref 0.0–5.0)
HCT: 25 % — ABNORMAL LOW (ref 36.0–46.0)
Hemoglobin: 8.5 g/dL — ABNORMAL LOW (ref 12.0–15.0)
Lymphocytes Relative: 10.3 % — ABNORMAL LOW (ref 12.0–46.0)
Lymphs Abs: 0.4 10*3/uL — ABNORMAL LOW (ref 0.7–4.0)
MCHC: 33.9 g/dL (ref 30.0–36.0)
MCV: 97.5 fl (ref 78.0–100.0)
MONO ABS: 0.3 10*3/uL (ref 0.1–1.0)
Monocytes Relative: 10 % (ref 3.0–12.0)
NEUTROS ABS: 2.6 10*3/uL (ref 1.4–7.7)
Neutrophils Relative %: 74.7 % (ref 43.0–77.0)
PLATELETS: 84 10*3/uL — AB (ref 150.0–400.0)
RBC: 2.57 Mil/uL — ABNORMAL LOW (ref 3.87–5.11)
RDW: 16.3 % — AB (ref 11.5–15.5)
WBC: 3.5 10*3/uL — AB (ref 4.0–10.5)

## 2015-06-01 NOTE — Patient Outreach (Signed)
[facility]) Care Management  06/01/2015  [name] 1941/07/04 [id number]   Today, [name] Harm, DNP-NP-C, Palliative Care Nurse Pracitioner and I are doing a joint visit. [name] is gathering pt's history and pt husband, caregiver and myself are providing additional information. We have addressed pt's nausea and occasional vomiting, pt's pain (neck, back and legs) Pain level gets to a 10 when at it's worse. She reports that the oxycodone 5 mg is effective to the point the pain would go to a 3-4/10. It does last a considerable amount of time and she tries not to take it routinely only prn. She does have a tendency to fall asleep during the day. She admits to sleep apnea and she has a CPAP but has not tolerated it in the past and states, empathically, "I will not not wear it." Pt reports she doesn't want to take more medication and she says she is "not ready to die." She feels she is less depressed than she was 2 years ago. She feels like sometimes the family doesn't recognize that she does have some days when she is more down that usual. "My breathing is awful." She told a story about spilling her cereal and milk and having to clean up and this episode, "Just wore me out!" [name] asked about her end of life wishes and we discussed this. Pt will have an endoscopy on Nov. 1st.   Objective - pt's edema is minimal and her wt is down to 231, FBS 120. Lungs diminised and clear.                    RRR.   PLAN: Suggested by [name]: 1) Reduce oxycodone to 2.5 mg and take it prn if needed every 6 hours for                                                  a trial basis. This could also help her when she is anxious and more      SOB.            2) Reinforced sleep hygiene. Open the blinds. Get outisde to sit more often.            3) Refer to services for the blind for visual aids            4) Reinforced activity to tolerance and resting when SOB.  I drew a CBC with diff for Dr.  Havery [name] and will deliver to to the lab at [facility].  We reviewed her MOST form and we discussed the need to complete a living will and HCPOA.              I will see pt in 2 weeks and [name] will return in one month.   [name] [facility] [phone]

## 2015-06-04 DIAGNOSIS — J449 Chronic obstructive pulmonary disease, unspecified: Secondary | ICD-10-CM | POA: Diagnosis not present

## 2015-06-05 ENCOUNTER — Telehealth: Payer: Self-pay | Admitting: Endocrinology

## 2015-06-05 NOTE — Telephone Encounter (Signed)
Madel from [facility] requesting the HA1C result from the last visit

## 2015-06-06 ENCOUNTER — Telehealth: Payer: Self-pay | Admitting: Pulmonary Disease

## 2015-06-06 NOTE — Telephone Encounter (Signed)
Spoke with pt's husband, aware that samples being placed up front.  Nothing further needed.

## 2015-06-08 ENCOUNTER — Other Ambulatory Visit: Payer: Self-pay | Admitting: *Deleted

## 2015-06-08 ENCOUNTER — Other Ambulatory Visit (INDEPENDENT_AMBULATORY_CARE_PROVIDER_SITE_OTHER): Payer: Medicare Other

## 2015-06-08 DIAGNOSIS — J449 Chronic obstructive pulmonary disease, unspecified: Secondary | ICD-10-CM | POA: Diagnosis not present

## 2015-06-08 DIAGNOSIS — K31819 Angiodysplasia of stomach and duodenum without bleeding: Secondary | ICD-10-CM

## 2015-06-08 LAB — CBC WITH DIFFERENTIAL/PLATELET
BASOS PCT: 0.3 % (ref 0.0–3.0)
Basophils Absolute: 0 10*3/uL (ref 0.0–0.1)
EOS ABS: 0.1 10*3/uL (ref 0.0–0.7)
EOS PCT: 4.4 % (ref 0.0–5.0)
LYMPHS PCT: 9.9 % — AB (ref 12.0–46.0)
Lymphs Abs: 0.3 10*3/uL — ABNORMAL LOW (ref 0.7–4.0)
MCHC: 33.6 g/dL (ref 30.0–36.0)
MCV: 98.9 fl (ref 78.0–100.0)
Monocytes Absolute: 0.3 10*3/uL (ref 0.1–1.0)
Monocytes Relative: 10.5 % (ref 3.0–12.0)
Neutro Abs: 2.4 10*3/uL (ref 1.4–7.7)
Neutrophils Relative %: 74.9 % (ref 43.0–77.0)
Platelets: 74 10*3/uL — ABNORMAL LOW (ref 150.0–400.0)
RBC: 2.49 Mil/uL — ABNORMAL LOW (ref 3.87–5.11)
RDW: 17.4 % — AB (ref 11.5–15.5)
WBC: 3.3 10*3/uL — AB (ref 4.0–10.5)

## 2015-06-08 NOTE — Patient Outreach (Signed)
[facility]) Care Management   06/08/2015  [name] [birth year] [id number]  [name] is an 74 y.o. female  Subjective: Pt reports having a fever. I can see a scab on her lower R leg which is red for several inches around the central lesion. Pt skin is very warm to touch. She is trying to stay away more in the day and is sleeping more at night and up eating less. Her glucose levels have been <200 on several occasions this week.  Objective:   Review of Systems  Constitutional: Positive for fever.  HENT: Negative.   Eyes: Negative.   Respiratory: Positive for shortness of breath.   Cardiovascular: Negative.   Gastrointestinal: Negative.   Genitourinary: Negative.   Musculoskeletal: Positive for myalgias.  Skin: Positive for rash.  Neurological: Positive for weakness.  Endo/Heme/Allergies: Bruises/bleeds easily.  Psychiatric/Behavioral: Negative.    BP 120/60 mmHg  Pulse 72  Temp(Src) 99.5 F (37.5 C)  Resp 18  Wt 233 lb (105.688 kg)  SpO2 93% FBS 280  Physical Exam  Constitutional: She appears well-developed and well-nourished.  HENT:  Head: Normocephalic.  Neck: Normal range of motion.  Cardiovascular: Normal rate.   Respiratory: Effort normal.  Diminished breath sounds.  GI: Soft. Bowel sounds are normal.  Musculoskeletal:  Limited ROM.  Skin: Rash noted.  Multiple lesions which she has had for years now that come and go. She has been picking on them but then cleaning them.    Current Medications:   Current Outpatient Prescriptions  Medication Sig Dispense Refill  . ADVAIR DISKUS 250-50 MCG/DOSE AEPB INHALE 1 PUFF INTO LUNGS TWICE A DAY 60 each 11  . albuterol (PROAIR HFA) 108 (90 BASE) MCG/ACT inhaler Inhale 2 puffs into the lungs every 6 (six) hours as needed for wheezing or shortness of breath.     . ALPRAZolam (XANAX) 1 MG tablet Take 1 tablet (1 mg total) by mouth every 8 (eight) hours as needed. for anxiety 30 tablet 0  . B  Complex-C ([employer] B COMPLEX PLUS C) TABS TAKE ONE CAPSULE BY MOUTH EVERY DAY 30 tablet 5  . buPROPion (WELLBUTRIN XL) 150 MG 24 hr tablet Take 1 tablet (150 mg total) by mouth daily. 90 tablet 3  . clotrimazole (LOTRIMIN) 1 % cream Apply topically 2 (two) times daily as needed. 30 g 1  . darbepoetin (ARANESP) 200 MCG/0.4ML SOLN injection Inject 200 mcg into the skin every 14 (fourteen) days.    [name] desvenlafaxine (PRISTIQ) 50 MG 24 hr tablet Take 3 tablets (150 mg total) by mouth daily. 90 tablet 11  . furosemide (LASIX) 40 MG tablet Increase Lasix to 80 mg BID x 3 days then resume current dose. (Patient taking differently: Take by mouth as directed. 60 mg in the morning and 40mg  in the afternoon) 30 tablet   . guaiFENesin (MUCINEX) 600 MG 12 hr tablet Take 600 mg by mouth 2 (two) times daily. 2 tablet by mouth once daily    . Insulin Glargine (LANTUS SOLOSTAR) 100 UNIT/ML Solostar Pen Inject 120 Units into the skin every morning. 20 pen 11  . insulin lispro (HUMALOG KWIKPEN) 100 UNIT/ML KiwkPen Inject 10 Units into the skin daily with supper.    . lactulose, encephalopathy, (CHRONULAC) 10 GM/15ML SOLN Take 30 mLs (20 g total) by mouth 2 (two) times daily. constipation (Patient not taking: Reported on 06/01/2015) 2700 mL 6  . levalbuterol (XOPENEX) 0.63 MG/3ML nebulizer solution Take 0.63 mg by nebulization 3 (three) times  daily.   5  . loratadine (CLARITIN) 10 MG tablet TAKE 1 TABLET EVERY DAY 30 tablet 6  . Multiple Vitamins-Minerals (CENTRUM SILVER PO) Take 1 tablet by mouth daily.     . nadolol (CORGARD) 20 MG tablet Take 0.5 tablets (10 mg total) by mouth daily. 15 tablet 1  . oxyCODONE (OXY IR/ROXICODONE) 5 MG immediate release tablet Take 1 tablet (5 mg total) by mouth every 6 (six) hours as needed for severe pain. 30 tablet 0  . pantoprazole (PROTONIX) 40 MG tablet Take 1 tablet (40 mg total) by mouth 2 (two) times daily. 60 tablet 6  . promethazine (PHENERGAN) 25 MG tablet Take 1 tablet (25 mg  total) by mouth 2 (two) times daily as needed for nausea. 30 tablet 3  . QUEtiapine (SEROQUEL) 200 MG tablet Take 1 tablet (200 mg total) by mouth at bedtime. 30 tablet 6  . rOPINIRole (REQUIP) 4 MG tablet TAKE 1 TABLET EVERY DAY 30 tablet 6  . Saline (OCEAN NASAL SPRAY NA) Place 1 spray into the nose 3 (three) times daily as needed (congestion).     . simvastatin (ZOCOR) 40 MG tablet TAKE 1 TABLET EVERY DAY 30 tablet 11  . spironolactone (ALDACTONE) 25 MG tablet TAKE 1/2 TABLET BY MOUTH DAILY 15 tablet 1  . spironolactone (ALDACTONE) 25 MG tablet TAKE 0.5 TABLETS (12.5 MG TOTAL) BY MOUTH DAILY. 15 tablet 0  . Vitamin D, Ergocalciferol, (DRISDOL) 50000 UNITS CAPS capsule TAKE ONE CAPSULE WEEKLY 4 capsule 6  . XIFAXAN 550 MG TABS tablet TAKE 1 TABLET TWICE A DAY 60 tablet 11   No current facility-administered medications for this visit.    Functional Status:   In your present state of health, do you have any difficulty performing the following activities: 05/26/2015 04/14/2015  Hearing? N N  Vision? N N  Difficulty concentrating or making decisions? N N  Walking or climbing stairs? Y Y  Dressing or bathing? N N  Doing errands, shopping? - Facilities manager and eating ? - -  Using the Toilet? - -  In the past six months, have you accidently leaked urine? - -  Do you have problems with loss of bowel control? - -  Managing your Medications? - -  Managing your Finances? - -  Housekeeping or managing your Housekeeping? - -    Fall/Depression Screening:    PHQ 2/9 Scores 04/03/2015 01/05/2015 12/19/2014 12/19/2014  PHQ - 2 Score 1 2 2 2   PHQ- 9 Score - - - 10    Assessment:  Fever - infected leg lesion  Plan: Cephalexin 500 mg one po bid for 14 days.           CBC with diff drawn and will take to Dr. Doyne Keel office.           I will see pt in 2 weeks.

## 2015-06-09 ENCOUNTER — Other Ambulatory Visit: Payer: Self-pay | Admitting: *Deleted

## 2015-06-09 ENCOUNTER — Encounter (HOSPITAL_COMMUNITY)
Admission: RE | Admit: 2015-06-09 | Discharge: 2015-06-09 | Disposition: A | Discharge status: Home / Self Care | Payer: Medicare Other | Source: Ambulatory Visit | Attending: Pulmonary Disease | Admitting: Pulmonary Disease

## 2015-06-09 DIAGNOSIS — Q2733 Arteriovenous malformation of digestive system vessel: Secondary | ICD-10-CM | POA: Diagnosis not present

## 2015-06-09 DIAGNOSIS — N289 Disorder of kidney and ureter, unspecified: Secondary | ICD-10-CM | POA: Diagnosis not present

## 2015-06-09 DIAGNOSIS — D649 Anemia, unspecified: Secondary | ICD-10-CM | POA: Insufficient documentation

## 2015-06-09 DIAGNOSIS — K7460 Unspecified cirrhosis of liver: Secondary | ICD-10-CM | POA: Diagnosis not present

## 2015-06-09 DIAGNOSIS — D509 Iron deficiency anemia, unspecified: Secondary | ICD-10-CM

## 2015-06-09 LAB — POCT HEMOGLOBIN-HEMACUE: Hemoglobin: 8.3 g/dL — ABNORMAL LOW (ref 12.0–15.0)

## 2015-06-09 MED ORDER — DARBEPOETIN ALFA 200 MCG/0.4ML IJ SOSY
200.0000 ug | PREFILLED_SYRINGE | INTRAMUSCULAR | Status: DC
  Administered 2015-06-09: 200 ug via SUBCUTANEOUS

## 2015-06-09 MED ORDER — DARBEPOETIN ALFA 200 MCG/0.4ML IJ SOSY
PREFILLED_SYRINGE | INTRAMUSCULAR | Status: AC
  Filled 2015-06-09: qty 0.4

## 2015-06-13 ENCOUNTER — Encounter: Payer: Self-pay | Admitting: *Deleted

## 2015-06-13 DIAGNOSIS — L089 Local infection of the skin and subcutaneous tissue, unspecified: Secondary | ICD-10-CM

## 2015-06-13 DIAGNOSIS — T148XXA Other injury of unspecified body region, initial encounter: Secondary | ICD-10-CM

## 2015-06-13 DIAGNOSIS — T798XXD Other early complications of trauma, subsequent encounter: Secondary | ICD-10-CM

## 2015-06-14 ENCOUNTER — Ambulatory Visit (INDEPENDENT_AMBULATORY_CARE_PROVIDER_SITE_OTHER): Payer: Medicare Other | Admitting: Gastroenterology

## 2015-06-14 ENCOUNTER — Encounter: Payer: Self-pay | Admitting: Gastroenterology

## 2015-06-14 ENCOUNTER — Other Ambulatory Visit (INDEPENDENT_AMBULATORY_CARE_PROVIDER_SITE_OTHER): Payer: Medicare Other

## 2015-06-14 VITALS — BP 120/70 | HR 68 | Temp 98.9°F | Ht 62.5 in | Wt 232.8 lb

## 2015-06-14 DIAGNOSIS — K31819 Angiodysplasia of stomach and duodenum without bleeding: Secondary | ICD-10-CM | POA: Diagnosis not present

## 2015-06-14 DIAGNOSIS — D509 Iron deficiency anemia, unspecified: Secondary | ICD-10-CM | POA: Diagnosis not present

## 2015-06-14 DIAGNOSIS — D649 Anemia, unspecified: Secondary | ICD-10-CM | POA: Diagnosis not present

## 2015-06-14 DIAGNOSIS — R9389 Abnormal findings on diagnostic imaging of other specified body structures: Secondary | ICD-10-CM

## 2015-06-14 DIAGNOSIS — K7460 Unspecified cirrhosis of liver: Secondary | ICD-10-CM

## 2015-06-14 DIAGNOSIS — R938 Abnormal findings on diagnostic imaging of other specified body structures: Secondary | ICD-10-CM

## 2015-06-14 LAB — CBC WITH DIFFERENTIAL/PLATELET
BASOS ABS: 0 10*3/uL (ref 0.0–0.1)
Basophils Relative: 0.4 % (ref 0.0–3.0)
EOS ABS: 0.2 10*3/uL (ref 0.0–0.7)
Eosinophils Relative: 4.7 % (ref 0.0–5.0)
HEMATOCRIT: 24.2 % — AB (ref 36.0–46.0)
LYMPHS PCT: 8.7 % — AB (ref 12.0–46.0)
Lymphs Abs: 0.3 10*3/uL — ABNORMAL LOW (ref 0.7–4.0)
MCHC: 33.2 g/dL (ref 30.0–36.0)
MCV: 99.4 fl (ref 78.0–100.0)
MONO ABS: 0.4 10*3/uL (ref 0.1–1.0)
Monocytes Relative: 11.1 % (ref 3.0–12.0)
Neutro Abs: 2.5 10*3/uL (ref 1.4–7.7)
Neutrophils Relative %: 75.1 % (ref 43.0–77.0)
PLATELETS: 78 10*3/uL — AB (ref 150.0–400.0)
RBC: 2.43 Mil/uL — ABNORMAL LOW (ref 3.87–5.11)
RDW: 18 % — ABNORMAL HIGH (ref 11.5–15.5)
WBC: 3.3 10*3/uL — AB (ref 4.0–10.5)

## 2015-06-14 NOTE — Patient Instructions (Signed)
We will contact you about referral dates.  We have sent the following medications to your pharmacy for you to pick up at your convenience: Ferrous SUlfate 325mg .

## 2015-06-14 NOTE — Progress Notes (Signed)
HPI :  74 y/o female here for follow up for NASH related cirrhosis and GAVE. Cirrhosis reportedly complicated by history of hepatic encephalopathy. She also recently had an abnormal MRCP.  The patient's main issue in related to portal hypertension has been GAVE. She has had longstanding anemia due to this issue, and has been having blood transfusions periodically for ongoing anemia. She has had 4-5 PRBC transfusions over the year at least, and a few in recent months. She reports her stools are usually brown but can see dark black in the stools intermittently - this is unchanged from our last visit. She is having 2-3 BMs per day. She reports color change is intermittent. Last PRBC transfusion was in August. Her Hgb today is 8, lower than in previous weeks. She is on nadolol and takes lactulose as needed, but she does not take it routinely due to diarrhea. She states she is eating okay, no routine vomiting or abdominal pains. Regarding her functional capacity she is very limited in her ability to ambulate. She is oxygen dependant for her COPD. She walks with a walker around home, but outside of home not much ambulation, uses wheelchair. No prior CAD / CVA. She does not take NSAIDs or anticoagulants. She denies chest pains. She is scheduled for an EGD with me on November 1st.   She had an MRCP as outlined below showing pancreatic ductal dilation since the last visit and we discussed what this entailed. She has intermittent epigastric discomfort. No weight loss. No FH of pancreatic cancer.   She otherwise has multiple areas of healing ulceration on her lower extremities. She reports it does not itch but family present today is concern she is picking at her legs or scratching it. No fevers. She has not seen a dermatologist.  Her most recent endoscopies are as follows: 02/25/15 - enteroscopy with APC of GAVE and small bowel AVMs 10/23/14 - ablation of GAVE via APC 02/08/13 - capsule endoscopy with small bowel  AVMs, all small   Past Medical History  Diagnosis Date  . Chronic airway obstruction, not elsewhere classified   . Unspecified chronic bronchitis ([place])   . Unspecified essential hypertension   . Non-obstructive CAD   . Palpitations   . Mild aortic stenosis     a. 03/2014 Valve area (VTI): 2.89 cm^2, Valve area (Vmax): 2.7 cm^2.  . Pure hypercholesterolemia   . Morbid obesity ([place])   . Esophageal reflux   . GAVE (gastric antral vascular ectasia)     angiodysplasia  . Diverticulosis of colon (without mention of hemorrhage)   . Benign neoplasm of colon   . Osteoarthrosis, unspecified whether generalized or localized, unspecified site   . Osteoporosis, unspecified   . Restless legs syndrome (RLS)   . Major depressive disorder, recurrent episode, severe, specified as with psychotic behavior   . Iron deficiency anemia secondary to blood loss (chronic)     a. frequent PRBC transfusions.  . Coarse tremors     a. arms.  . Carpal tunnel syndrome on both sides   . Chronic diastolic CHF (congestive heart failure) ([place])     a. 03/26/2014 Echo: EF 55-60%, no rwma, mild AS/AI, mod-sev Ca2+ MV annulus, mildly to mod dil LA.  [name] Asthma   . Type II diabetes mellitus ([place])   . H/O hiatal hernia   . 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 dx'd 1990's  . Adenomatous colon polyp   . Midsternal chest pain  a. conservatively managed ->poor candidate for cath/anticoagulation.  [name] Anxiety   . Portal hypertensive gastropathy   . Falls frequently     completed HHPT/OT 06/2014, PT unmet goals  . Recurrent UTI (urinary tract infection)     h/o hospitalization with urosepsis 2015, but thought large component colonization/bacteriuria, only treat if symptomatic ([place])  . Hiatal hernia   . Thrombocytopenia ([place])   . Protein calorie malnutrition ([place])   . History of pneumonia     CAP 2015  . On home oxygen therapy     "3L; 24/7" (01/25/2015)  . OSA (obstructive sleep  apnea)     a. does not use CPAP. (01/25/2015)  . GAVE (gastric antral vascular ectasia)      Past Surgical History  Procedure Laterality Date  . Hemorroidectomy    . Appendectomy    . Vaginal hysterectomy    . Breast biopsy      bilateral  . Cesarean section      x 3  . Appendectomy    . Esophagogastroduodenoscopy  06/12/2012    Procedure: ESOPHAGOGASTRODUODENOSCOPY (EGD);  Surgeon: Lafayette Dragon, MD;  Location: [facility];  Service: Endoscopy;  Laterality: N/A;  . Balloon dilation  06/12/2012    Procedure: BALLOON DILATION;  Surgeon: Lafayette Dragon, MD;  Location: [facility];  Service: Endoscopy;  Laterality: N/A;  ?balloon  . Esophagogastroduodenoscopy  09/10/2012    Procedure: ESOPHAGOGASTRODUODENOSCOPY (EGD);  Surgeon: Lafayette Dragon, MD;  Location: [facility];  Service: Endoscopy;  Laterality: N/A;  . Hot hemostasis  09/10/2012    Procedure: HOT HEMOSTASIS (ARGON PLASMA COAGULATION/BICAP);  Surgeon: Lafayette Dragon, MD;  Location: [facility];  Service: Endoscopy;  Laterality: N/A;  . Esophagogastroduodenoscopy N/A 01/18/2013    Procedure: ESOPHAGOGASTRODUODENOSCOPY (EGD);  Surgeon: Lafayette Dragon, MD;  Location: [facility] END[facility]COPY;  Service: Endoscopy;  Laterality: N/A;  . Esophagogastroduodenoscopy N/A 05/24/2013    Procedure: ESOPHAGOGASTRODUODENOSCOPY (EGD);  Surgeon: Jerene Bears, MD;  Location: [place];  Service: Gastroenterology;  Laterality: N/A;  . Hot hemostasis N/A 05/24/2013    Procedure: HOT HEMOSTASIS (ARGON PLASMA COAGULATION/BICAP);  Surgeon: Jerene Bears, MD;  Location: [place];  Service: Gastroenterology;  Laterality: N/A;  . US echocardiography  07/2014    mild LVH, EF 60-65%, normal wall motion, mild AR, mod dilated LA, mildly dilated RA, peak PA pressure 58mmHg  . Esophagogastroduodenoscopy N/A 10/20/2014    Procedure: ESOPHAGOGASTRODUODENOSCOPY (EGD);  Surgeon: Lafayette Dragon, MD;  Location: [facility];  Service: Endoscopy;  Laterality: N/A;  . Hot  hemostasis N/A 10/20/2014    Procedure: HOT HEMOSTASIS (ARGON PLASMA COAGULATION/BICAP);  Surgeon: Lafayette Dragon, MD;  Location: [facility];  Service: Endoscopy;  Laterality: N/A;  . Enteroscopy N/A 02/25/2015    Procedure: ENTEROSCOPY;  Surgeon: [name] Ada, MD;  Location: [place];  Service: Endoscopy;  Laterality: N/A;   Family History  Problem Relation Age of Onset  . Heart disease Mother   . Cervical cancer Mother   . Kidney disease Mother   . Diabetes Mother   . Breast cancer Sister   . Multiple sclerosis Sister   . Colon cancer Neg Hx   . Breast cancer Maternal Aunt     x 2  . Diabetes Father   . Diabetes Sister   . Multiple sclerosis Other   . Heart attack Mother   . Heart attack Father   . Stroke Daughter   . Stroke Daughter    Social History  Substance Use Topics  .  Smoking status: Former Smoker -- 1.50 packs/day for 25 years    Types: Cigarettes    Quit date: 08/26/1992  . Smokeless tobacco: Never Used  . Alcohol Use: No     Comment: 3/76/28 "last alcoholic drink was years ago"   Current Outpatient Prescriptions  Medication Sig Dispense Refill  . ADVAIR DISKUS 250-50 MCG/DOSE AEPB INHALE 1 PUFF INTO LUNGS TWICE A DAY 60 each 11  . albuterol (PROAIR HFA) 108 (90 BASE) MCG/ACT inhaler Inhale 2 puffs into the lungs every 6 (six) hours as needed for wheezing or shortness of breath.     . ALPRAZolam (XANAX) 1 MG tablet Take 1 tablet (1 mg total) by mouth every 8 (eight) hours as needed. for anxiety 30 tablet 0  . B Complex-C ([employer] B COMPLEX PLUS C) TABS TAKE ONE CAPSULE BY MOUTH EVERY DAY 30 tablet 5  . buPROPion (WELLBUTRIN XL) 150 MG 24 hr tablet Take 1 tablet (150 mg total) by mouth daily. 90 tablet 3  . cephALEXin (KEFLEX) 500 MG capsule TAKE ONE CAPSULE BY MOUTH TWICE A DAY X 14 DAYS  0  . clotrimazole (LOTRIMIN) 1 % cream Apply topically 2 (two) times daily as needed. (Patient taking differently: Apply 1 application topically 2 (two) times daily as needed  (fungal infection). ) 30 g 1  . darbepoetin (ARANESP) 200 MCG/0.4ML SOLN injection Inject 200 mcg into the skin every 14 (fourteen) days.    [name] desvenlafaxine (PRISTIQ) 50 MG 24 hr tablet Take 3 tablets (150 mg total) by mouth daily. 90 tablet 11  . furosemide (LASIX) 40 MG tablet Increase Lasix to 80 mg BID x 3 days then resume current dose. (Patient taking differently: Take 40 mg by mouth 2 (two) times daily. ) 30 tablet   . guaiFENesin (MUCINEX) 600 MG 12 hr tablet Take 600 mg by mouth 2 (two) times daily.     . Insulin Glargine (LANTUS SOLOSTAR) 100 UNIT/ML Solostar Pen Inject 120 Units into the skin every morning. 20 pen 11  . insulin lispro (HUMALOG KWIKPEN) 100 UNIT/ML KiwkPen Inject 10 Units into the skin daily with supper.    . lactulose, encephalopathy, (CHRONULAC) 10 GM/15ML SOLN Take 30 mLs (20 g total) by mouth 2 (two) times daily. constipation (Patient taking differently: Take 20 g by mouth 2 (two) times daily as needed (consitpation). ) 2700 mL 6  . levalbuterol (XOPENEX) 0.63 MG/3ML nebulizer solution Take 0.63 mg by nebulization every 8 (eight) hours as needed for wheezing or shortness of breath.   5  . loratadine (CLARITIN) 10 MG tablet TAKE 1 TABLET EVERY DAY 30 tablet 6  . Multiple Vitamins-Minerals (CENTRUM SILVER PO) Take 1 tablet by mouth daily.     . nadolol (CORGARD) 20 MG tablet Take 0.5 tablets (10 mg total) by mouth daily. (Patient taking differently: Take 5 mg by mouth daily. ) 15 tablet 1  . oxyCODONE (OXY IR/ROXICODONE) 5 MG immediate release tablet Take 1 tablet (5 mg total) by mouth every 6 (six) hours as needed for severe pain. 30 tablet 0  . pantoprazole (PROTONIX) 40 MG tablet Take 1 tablet (40 mg total) by mouth 2 (two) times daily. 60 tablet 6  . promethazine (PHENERGAN) 25 MG tablet Take 1 tablet (25 mg total) by mouth 2 (two) times daily as needed for nausea. 30 tablet 3  . QUEtiapine (SEROQUEL) 200 MG tablet Take 1 tablet (200 mg total) by mouth at bedtime. 30  tablet 6  . rOPINIRole (REQUIP) 4 MG tablet TAKE  1 TABLET EVERY DAY 30 tablet 6  . Saline (OCEAN NASAL SPRAY NA) Place 1 spray into the nose 3 (three) times daily as needed (congestion).     . simvastatin (ZOCOR) 40 MG tablet TAKE 1 TABLET EVERY DAY 30 tablet 11  . spironolactone (ALDACTONE) 25 MG tablet TAKE 0.5 TABLETS (12.5 MG TOTAL) BY MOUTH DAILY. 15 tablet 0  . Vitamin D, Ergocalciferol, (DRISDOL) 50000 UNITS CAPS capsule TAKE ONE CAPSULE WEEKLY (Patient taking differently: TAKE ONE CAPSULE WEEKLY. Friday) 4 capsule 6  . XIFAXAN 550 MG TABS tablet TAKE 1 TABLET TWICE A DAY 60 tablet 11   No current facility-administered medications for this visit.   Allergies  Allergen Reactions  . Acetaminophen Other (See Comments)    Liver dysfunction  . Aspirin Other (See Comments)    Causes nosebleeds  . Theophylline Nausea And Vomiting  . Nsaids     Liver dysfunction   . Penicillins Itching    Has patient had a PCN reaction causing immediate rash, facial/tongue/throat swelling, SOB or lightheadedness with hypotension: unknown Has patient had a PCN reaction causing severe rash involving mucus membranes or skin necrosis: unknown Has patient had a PCN reaction that required hospitalization unknown Has patient had a PCN reaction occurring within the last 10 years: yes If all of the above answers are "NO", then may proceed with Cephalosporin use.      Review of Systems: All systems reviewed and negative except where noted in HPI.   Lab Results  Component Value Date   WBC 3.3* 06/14/2015   HGB 8.0 Repeated and verified X2.* 06/14/2015   HCT 24.2* 06/14/2015   MCV 99.4 06/14/2015   PLT 78.0* 06/14/2015    Lab Results  Component Value Date   ALT 36 05/08/2015   AST 46* 05/08/2015   ALKPHOS 100 05/08/2015   BILITOT 1.3* 05/08/2015    Lab Results  Component Value Date   CREATININE 1.15* 05/08/2015   BUN 27* 05/08/2015   NA 139 05/08/2015   K 3.9 05/08/2015   CL 98* 05/08/2015     CO2 32 05/08/2015      US Abdomen Complete  05/18/2015  CLINICAL DATA:  Cirrhosis and anemia. History of hypertension and diabetes. Subsequent encounter. EXAM: ULTRASOUND ABDOMEN COMPLETE COMPARISON:  Abdominal ultrasound 02/02/2013. FINDINGS: Gallbladder: No gallstones or wall thickening visualized. No sonographic Murphy sign noted. Common bile duct: Diameter: 7 mm. Liver: Contour irregularity of the liver and heterogeneous echotexture again noted, similar to prior examination. No focal lesions observed. IVC: No abnormality visualized. Pancreas: The pancreatic duct appears dilated within the pancreatic head to 9 mm. This was not documented previously. No pancreatic mass or surrounding fluid collection observed. Spleen: Progressive splenomegaly. The spleen measures 16.9 x 16.6 x 7.8 cm for an estimated volume of 1138 ml. There is a stable small cystic lesion in the spleen. Right Kidney: Length: 11.4 cm. Echogenicity within normal limits. No mass or hydronephrosis visualized. Left Kidney: Length: 10.4 cm. Echogenicity within normal limits. No mass or hydronephrosis visualized. Abdominal aorta: No aneurysm visualized. Other findings: No ascites observed. IMPRESSION: 1. Grossly stable appearance of the liver with contour irregularity and heterogeneous echotexture consistent with cirrhosis. No focal lesions observed. 2. No evidence of gallbladder or biliary disease. 3. New pancreatic ductal dilatation of undetermined etiology. Is there a history of pancreatitis? CT or MRCP should be considered to exclude underlying occult pancreatic lesion. 4. Progressive splenomegaly, likely secondary to portal hypertension. Electronically Signed   By: Caryl Comes.D.  On: 05/18/2015 10:19   Mr 3d Recon At Scanner  05/24/2015  CLINICAL DATA:  NASH related cirrhosis. Pancreatic duct dilatation questioned on ultrasound. EXAM: MRI ABDOMEN WITHOUT AND WITH CONTRAST (INCLUDING MRCP) TECHNIQUE: Multiplanar multisequence MR  imaging of the abdomen was performed both before and after the administration of intravenous contrast. Heavily T2-weighted images of the biliary and pancreatic ducts were obtained, and three-dimensional MRCP images were rendered by post processing. CONTRAST:  2mL MULTIHANCE GADOBENATE DIMEGLUMINE 529 MG/ML IV SOLN COMPARISON:  Ultrasound of 05/18/2015 FINDINGS: Mild degradation secondary to patient body habitus and mild motion. Lower chest: Mild cardiomegaly, without pericardial or pleural effusion. Periesophageal varices, including on image#/series# 26/1102. Hepatobiliary: Mild hepatomegaly with moderate cirrhosis. 11 mm T2 hyperintense lesion demonstrates vague peripheral enhancement on both early and late post-contrast images. image#/series# 25/3 and 44/1104. Normal gallbladder, without biliary ductal dilatation. Pancreas: Pancreatic atrophy. Mild main duct dilatation within the body and tail. Moderate main duct dilatation within the head and neck. Example 9 mm on image#/series# 33/3. Areas of side branch duct ectasia, including on image#/series# 70/4. No dominant obstructive mass. No abnormal post-contrast enhancement. Spleen: Splenomegaly, 16.4 cm craniocaudal. Probable splenic cyst within, 1.0 cm. Adrenals/Urinary Tract: Minimal right adrenal nodularity. Normal left adrenal gland. Too small to characterize right renal lesion. Normal left kidney, without hydronephrosis. Stomach/Bowel: Proximal gastric underdistention. Large and small bowel loops unremarkable. Vascular/Lymphatic: Normal caliber of the aorta and branch vessels. Patent hepatic and portal veins. No retroperitoneal or retrocrural adenopathy. Other: No ascites. Musculoskeletal: No acute osseous abnormality. IMPRESSION: 1. Main and side branch pancreatic ductectasia with atrophy. Favor the sequelae of chronic pancreatitis. Given the extent of main branch dilatation in the head and neck, mixed intraductal papillary mucinous tumor could look similar  but is felt less likely. Consider follow-up with pre and post contrast abdominal MRI at 6 months. 2. Cirrhosis and portal venous hypertension, without evidence of hepatocellular carcinoma. 3. A right liver lobe lesion is favored due to represent a minimally complex cyst or an atypical hemangioma. 4. Decreased sensitivity and specificity exam due to technique related factors, as described above. Electronically Signed   By: Abigail Miyamoto M.D.   On: 05/24/2015 14:17   Mr Abd W/wo Cm/mrcp  05/24/2015  CLINICAL DATA:  NASH related cirrhosis. Pancreatic duct dilatation questioned on ultrasound. EXAM: MRI ABDOMEN WITHOUT AND WITH CONTRAST (INCLUDING MRCP) TECHNIQUE: Multiplanar multisequence MR imaging of the abdomen was performed both before and after the administration of intravenous contrast. Heavily T2-weighted images of the biliary and pancreatic ducts were obtained, and three-dimensional MRCP images were rendered by post processing. CONTRAST:  47mL MULTIHANCE GADOBENATE DIMEGLUMINE 529 MG/ML IV SOLN COMPARISON:  Ultrasound of 05/18/2015 FINDINGS: Mild degradation secondary to patient body habitus and mild motion. Lower chest: Mild cardiomegaly, without pericardial or pleural effusion. Periesophageal varices, including on image#/series# 26/1102. Hepatobiliary: Mild hepatomegaly with moderate cirrhosis. 11 mm T2 hyperintense lesion demonstrates vague peripheral enhancement on both early and late post-contrast images. image#/series# 25/3 and 44/1104. Normal gallbladder, without biliary ductal dilatation. Pancreas: Pancreatic atrophy. Mild main duct dilatation within the body and tail. Moderate main duct dilatation within the head and neck. Example 9 mm on image#/series# 33/3. Areas of side branch duct ectasia, including on image#/series# 70/4. No dominant obstructive mass. No abnormal post-contrast enhancement. Spleen: Splenomegaly, 16.4 cm craniocaudal. Probable splenic cyst within, 1.0 cm. Adrenals/Urinary Tract:  Minimal right adrenal nodularity. Normal left adrenal gland. Too small to characterize right renal lesion. Normal left kidney, without hydronephrosis. Stomach/Bowel: Proximal gastric underdistention. Large  and small bowel loops unremarkable. Vascular/Lymphatic: Normal caliber of the aorta and branch vessels. Patent hepatic and portal veins. No retroperitoneal or retrocrural adenopathy. Other: No ascites. Musculoskeletal: No acute osseous abnormality. IMPRESSION: 1. Main and side branch pancreatic ductectasia with atrophy. Favor the sequelae of chronic pancreatitis. Given the extent of main branch dilatation in the head and neck, mixed intraductal papillary mucinous tumor could look similar but is felt less likely. Consider follow-up with pre and post contrast abdominal MRI at 6 months. 2. Cirrhosis and portal venous hypertension, without evidence of hepatocellular carcinoma. 3. A right liver lobe lesion is favored due to represent a minimally complex cyst or an atypical hemangioma. 4. Decreased sensitivity and specificity exam due to technique related factors, as described above. Electronically Signed   By: Abigail Miyamoto M.D.   On: 05/24/2015 14:17    Physical Exam: BP 120/70 mmHg  Pulse 68  Temp(Src) 98.9 F (37.2 C) (Oral)  Ht 5' 2.5" (1.588 m)  Wt 232 lb 12.8 oz (105.597 kg)  BMI 41.87 kg/m2 Constitutional: Pleasant, female in no acute distress, oxygen [place] in place, sitting in wheelchair. HEENT: Normocephalic and atraumatic. Conjunctivae are pale. No scleral icterus. Neck supple.  Cardiovascular: Normal rate, regular rhythm. 2/6 SEM Pulmonary/chest: Effort normal and breath sounds normal. No wheezing, rales or rhonchi. Abdominal: Soft, protuberant, nontender. Bowel sounds active throughout. There are no masses palpable.  Extremities:trace edema B Lymphadenopathy: No cervical adenopathy noted. Neurological: Alert and oriented to person place and time. Skin: Skin is warm and dry. Multiple isolated  ulcerations, several with scabs, with mild surrounding erythema along the lower legs bilaterally, largest 1cm in size Psychiatric: Normal mood and affect. Behavior is normal.   ASSESSMENT AND PLAN: 74 y/o female with cirrhosis secondary to NASH, associated with GAVE and small bowel AVMs, which has led to ongoing anemia and blood transfusions. Last transfusion in August however Hgb has been drifting down in recent weeks, now to 8.0.  She has had 2 endoscopies this year for ablation of GAVE, and others in the past. She otherwise has significant co morbidities, including oxygen dependant COPD..  I had a lengthy discussion with Mrs. Tedesco today to discuss her active issues, namely anemia / GAVE and MRCP findings. To prevent further anemia / GI tract blood loss she is scheduled for another enteroscopy with APC ablation. I discussed the risks / benefits of endoscopy and anesthesia with them at length. She is clearly a high risk patient in regards to her anesthesia risk, however given her recent past we can only assume without intervention she will continue to have anemia needing transfusion. Hopefully with endoscopy we can minimize her need for transfusion and hospitalization. Her Hgb since I have last seen her is slowly downtrending. I offered her a PRBC transfusion today but she wishes to hold off for as long as possible and states she usually "waits until I am in the 7s" given she otherwise feels at her baseline. Will check labs on Monday AM and coordinate it then if need be, in the interim if she feels poorly I asked her to contact us and we can set up a transfusion. I will resume oral iron at this time, she had self stopped it. Otherwise, there is evidence of using RFA / Halo device to treat refractory GAVE with good results in patient who fail APC. I think this would be useful if available. I discussed this with her, and while I asked her to keep her appointment for [facility] treatment  with me, I will refer her to  [facility] for evaluation for  RFA/Halo treatment of her refractory GAVE. If this can be done, hopefully it can minimize her transfusion requirement.   Otherwise, during her [place] screening on [place], she was noted to have nonspecific ductal dilation of the pancreas. MRCP obtained which confirmed this. She has had this on remote imaging but appears more prominent on this exam. No focal mass lesion is noted however the differential includes IPMN or mass lesion / malignancy of the pancreas. I discussed my concerns with the patient and options to further evaluate this moving forward, which include EUS vs. repeat MRCP in 6 months or so. She has significant comorbidities. If she had a pancreatic malignancy she would be considered to be a poor surgical candidate. Following our conversation she wishes to obtain MRCP in 6 months from her last exam, and if changes continue to progress she will then consider EUS.   Finally, regarding the patient's rash, will refer to derm. Advised her not to pick at it (suspect this could be the cause), can use vasoline PRN. Await derm consult. She agreed.   [place] Cellar, MD [facility] Pager [phone]

## 2015-06-15 ENCOUNTER — Telehealth: Payer: Self-pay

## 2015-06-15 ENCOUNTER — Other Ambulatory Visit: Payer: Self-pay | Admitting: *Deleted

## 2015-06-15 ENCOUNTER — Encounter: Payer: Self-pay | Admitting: *Deleted

## 2015-06-15 MED ORDER — FERROUS SULFATE 325 (65 FE) MG PO TABS: 325.0000 mg | ORAL_TABLET | Freq: Every day | ORAL | Status: DC

## 2015-06-15 NOTE — Patient Outreach (Signed)
[facility]) Care Management   06/15/2015  [name] April 26, 1941 [id number]  [name] is an 74 y.o. female  Subjective: Pt is feeling poorly. She knows her blood count is down to 8.0. She is angry about her health situation and that there is nothing she can do except try to follow her routine regimen. Her skin lesions are only somewhat improved with treatment. She has made an appt to see Dr. Elvera Lennox on Monday.  Objective:   Review of Systems  Constitutional: Positive for fever and chills.  Eyes: Negative.   Respiratory: Negative.   Cardiovascular: Negative.   Gastrointestinal: Negative.   Genitourinary: Negative.   Musculoskeletal: Positive for myalgias.  Skin: Positive for rash.  Neurological: Negative.   Endo/Heme/Allergies: Bruises/bleeds easily.  Psychiatric/Behavioral: Positive for depression. The patient is nervous/anxious.    BP 120/50 mmHg  Pulse 62  Temp(Src) 99.2 F (37.3 C)  Resp 18  Wt 230 lb (104.327 kg)  SpO2 97% FBS 201   Physical Exam  Constitutional: She appears well-developed and well-nourished.  HENT:  Head: Normocephalic.  Cardiovascular: Normal rate and regular rhythm.   Murmur heard. Respiratory: Effort normal and breath sounds normal.  GI: Soft. Bowel sounds are normal.  Musculoskeletal:  Limited ROM.  Skin:  Skin lesions still look inflammed after one week tx - cephalexin 500 mg bid. Pt has made appt to see dermatologist.   Functional Status:   In your present state of health, do you have any difficulty performing the following activities: 05/26/2015 04/14/2015  Hearing? N N  Vision? N N  Difficulty concentrating or making decisions? N N  Walking or climbing stairs? Y Y  Dressing or bathing? N N  Doing errands, shopping? - Facilities manager and eating ? - -  Using the Toilet? - -  In the past six months, have you accidently leaked urine? - -  Do you have problems with loss of bowel control? - -  Managing  your Medications? - -  Managing your Finances? - -  Housekeeping or managing your Housekeeping? - -    Fall/Depression Screening:    PHQ 2/9 Scores 04/03/2015 01/05/2015 12/19/2014 12/19/2014  PHQ - 2 Score 1 2 2 2   PHQ- 9 Score - - - 10    Assessment:  Angry about her health conditions, going through the grieving process.   Anemia - hgb was 8.0 on Wednesday, will probabaly need transfusion next week.   Infected leg lesions - minor improvement with cephalexin, pt has made an appt     To see Dr. Elvera Lennox, dermatology on Monday. I think she needs a                                       culture and to rule out MRSA.           Plan: I will follow up with pt in one week as things are coming up on a weekly basis that I need            to assist her with.   I counseled pt today about the stages that we humans go through when we experience bad things. I explained to her by her descriptions sound like she is going through an ANGRY stage and she agreed. I reassured her this is a normal human reponse and that she should talk about it with her loved  ones or with me.  THN CM Care Plan Problem One        Most Recent Value   Care Plan Problem One  Pt high risk for readmission related to multiple health conditions   Role Documenting the Problem One  Care Management [place] for Problem One  Active   THN Long Term Goal (31-90 days)  Pt will not readmit in 90 days.   THN Long Term Goal Start Date  05/02/15   Interventions for Problem One Long Term Goal  Call NP for signs of problems!    THN CM Care Plan Problem Two        Most Recent Value   Care Plan Problem Two  Diabetes   Role Documenting the Problem Two  Care Management Coordinator   Care Plan for Problem Two  Active   Interventions for Problem Two Long Term Goal   Backsliding only 3 values below 200 in the last week all others in 200-300s. Praised for her low 3 values and encouraged to keep trrying to follow he diet.   THN Long  Term Goal (31-90) days  Pt will have reduced average monthly glucose values over the next 90 days.    THN CM Care Plan Problem Three        Most Recent Value   Interventions for Short Term Goal #1  Pt wt up to 230 but no signs of HF. Continue current regimen.     Deloria Lair [facility] [place] [phone]

## 2015-06-15 NOTE — Telephone Encounter (Signed)
Pt called and scheduled herself for a Derm consult. I have faxed over her notes to [place] and I am awaiting a response.

## 2015-06-17 ENCOUNTER — Other Ambulatory Visit: Payer: Self-pay | Admitting: Nurse Practitioner

## 2015-06-19 ENCOUNTER — Other Ambulatory Visit: Payer: Self-pay | Admitting: Family Medicine

## 2015-06-19 DIAGNOSIS — L981 Factitial dermatitis: Secondary | ICD-10-CM | POA: Diagnosis not present

## 2015-06-21 ENCOUNTER — Telehealth: Payer: Self-pay | Admitting: *Deleted

## 2015-06-21 ENCOUNTER — Encounter (HOSPITAL_COMMUNITY): Payer: Self-pay | Admitting: *Deleted

## 2015-06-21 NOTE — Telephone Encounter (Signed)
Spoke with Arbie Cookey and she will get labs on patient tomorrow.

## 2015-06-21 NOTE — Telephone Encounter (Signed)
Called pt and informed her of her appointment with [place] on 07/06/2015 at 12:00pm with Dr. Newman Pies. Patient understands

## 2015-06-21 NOTE — Patient Outreach (Signed)
[facility]) Care Management  06/21/2015  DENALY GATLING December 13, 1940 [id number]   Home visit today. Pt skin lesions are somewhat better after treatment with cephalexin. Husband informs me they have made an appt to see a dermatologist about this on Monday, Oct 17. Pt saw Dr. Havery Moros this am, she had lab work and will not have a transfusion yet.  BP 120/50 mmHg  Pulse 62  Temp(Src) 99.2 F (37.3 C)  Resp 18  Wt 230 lb (104.327 kg)  SpO2 97%  I will see pt again next week and draw a CBC with diff. Merrily Pew [employer]orders will also be checking in on hers.  Deloria Lair [facility] [place] [phone]

## 2015-06-22 ENCOUNTER — Telehealth: Payer: Self-pay | Admitting: *Deleted

## 2015-06-22 ENCOUNTER — Other Ambulatory Visit: Payer: Self-pay | Admitting: *Deleted

## 2015-06-22 ENCOUNTER — Encounter: Payer: Self-pay | Admitting: *Deleted

## 2015-06-22 ENCOUNTER — Other Ambulatory Visit (INDEPENDENT_AMBULATORY_CARE_PROVIDER_SITE_OTHER): Payer: Medicare Other

## 2015-06-22 DIAGNOSIS — D509 Iron deficiency anemia, unspecified: Secondary | ICD-10-CM | POA: Diagnosis not present

## 2015-06-22 LAB — CBC WITH DIFFERENTIAL/PLATELET
BASOS PCT: 0.2 % (ref 0.0–3.0)
Basophils Absolute: 0 10*3/uL (ref 0.0–0.1)
EOS ABS: 0.2 10*3/uL (ref 0.0–0.7)
Eosinophils Relative: 4.6 % (ref 0.0–5.0)
HCT: 23.7 % — CL (ref 36.0–46.0)
Lymphocytes Relative: 6.5 % — ABNORMAL LOW (ref 12.0–46.0)
Lymphs Abs: 0.2 10*3/uL — ABNORMAL LOW (ref 0.7–4.0)
MCHC: 33 g/dL (ref 30.0–36.0)
MCV: 100.8 fl — ABNORMAL HIGH (ref 78.0–100.0)
MONO ABS: 0.3 10*3/uL (ref 0.1–1.0)
Monocytes Relative: 8.6 % (ref 3.0–12.0)
NEUTROS PCT: 80.1 % — AB (ref 43.0–77.0)
Neutro Abs: 2.7 10*3/uL (ref 1.4–7.7)
PLATELETS: 72 10*3/uL — AB (ref 150.0–400.0)
RDW: 17.8 % — AB (ref 11.5–15.5)
WBC: 3.4 10*3/uL — AB (ref 4.0–10.5)

## 2015-06-22 NOTE — Telephone Encounter (Signed)
Faxed order to [facility].

## 2015-06-22 NOTE — Patient Outreach (Signed)
[name]) Care Management  06/22/2015  [name] 11-May-1941 [id number]   S: Pt feeling tired. She is tolerating the newly prescribed iron tablets. She is trying to get more daily exercise. She still struggles with adhering to her diabetic diet. She saw the dermatologist and he told her to keep doing what she is doing to care for the skin lesions: wash with soap and water, apply mupirocin ointment, AND COVER with a band-aid so prevent her from picking.  She continues to run a low grade fever.  She will have a GI procedure next week and will go to [facility] for another diagnostic test 07/06/15. Her wt is stable high 220s-low 230s.  O:  BP 120/58 mmHg  Pulse 57  Temp(Src) 98.5 F (36.9 C)  Resp 20  Wt 232 lb (105.235 kg)  SpO2 97%   A: Anemia     Skin lesions     Diabetes     HF     COPD  P:  [facility] lab requested for Dr. Havery Moros (CBC with diff & platelets).       Reinforced dermatologist instructions.       Reinforced diabetes management.       I will see pt or talk to her by phone next Thursday.  THN CM Care Plan Problem One        Most Recent Value   Care Plan Problem One  Pt high risk for readmission related to multiple health conditions   Role Documenting the Problem One  Care Management [place] for Problem One  Active   THN CM Short Term Goal #1 (0-30 days)  pt will have no readmissions related to chronic health conditions within 30 days   THN CM Short Term Goal #1 Start Date  03/20/15   [facility] CM Short Term Goal #1 Met Date  04/25/15   Interventions for Short Term Goal #1  Pt called over weekend for unimproved condition and was given additional treatment.and prevented hospitalization!    THN CM Care Plan Problem Two        Most Recent Value   Care Plan Problem Two  Diabetes   Role Documenting the Problem Two  Care Management Coordinator   Care Plan for Problem Two  Active   Interventions for Problem Two Long Term Goal   2  readings <200 this week, none over 350. Pt knows how she is supposed to eat and reports less night time snacking on.   THN Long Term Goal (31-90) days  Pt will have reduced average monthly glucose values over the next 90 days.   THN CM Short Term Goal #1 (0-30 days)  Pt will monitor her glucose level bid and record for the next 30 days.   THN CM Short Term Goal #1 Start Date  03/27/15   THN CM Short Term Goal #1 Met Date   04/25/15   THN CM Short Term Goal #2 (0-30 days)  Pt will have at least 50% of her glucose values under 300.   Interventions for Short Term Goal #2  Pt only hac 2 values over 300, but only 2 under 200.[name] Counseled on dietary compliance- reduce carbs and especially cakes!    [facility] CM Care Plan Problem Three        Most Recent Value   Care Plan Problem Three  HF   Role Documenting the Problem Three  Care Management Coordinator   Care Plan for Problem Three  Active   THN CM Short Term Goal #1 (0-30 days)  Pt to weigh daily and report to NP wt gain of 3-5 lbs above current wt. 209 over next 30 days.   THN CM Short Term Goal #1 Start Date  03/27/15   Interventions for Short Term Goal #1  No sig wt gain.     Deloria [name] [place] [phone]

## 2015-06-22 NOTE — Telephone Encounter (Signed)
Patient has appt for Aranesp injection tomorrow and needs updated order. In your IN box for completion. Please return to me. Thanks!

## 2015-06-22 NOTE — Telephone Encounter (Signed)
Filled and in Kim's box. 

## 2015-06-23 ENCOUNTER — Other Ambulatory Visit: Payer: Self-pay | Admitting: *Deleted

## 2015-06-23 ENCOUNTER — Other Ambulatory Visit (HOSPITAL_COMMUNITY)
Admission: RE | Admit: 2015-06-23 | Discharge: 2015-06-23 | Disposition: A | Discharge status: Home / Self Care | Payer: Medicare Other | Source: Ambulatory Visit | Attending: Gastroenterology | Admitting: Gastroenterology

## 2015-06-23 ENCOUNTER — Encounter (HOSPITAL_COMMUNITY)
Admission: RE | Admit: 2015-06-23 | Discharge: 2015-06-23 | Disposition: A | Discharge status: Home / Self Care | Payer: Medicare Other | Source: Ambulatory Visit | Attending: Family Medicine | Admitting: Family Medicine

## 2015-06-23 DIAGNOSIS — Z0183 Encounter for blood typing: Secondary | ICD-10-CM | POA: Diagnosis not present

## 2015-06-23 DIAGNOSIS — K7460 Unspecified cirrhosis of liver: Secondary | ICD-10-CM | POA: Diagnosis not present

## 2015-06-23 DIAGNOSIS — N289 Disorder of kidney and ureter, unspecified: Secondary | ICD-10-CM | POA: Diagnosis not present

## 2015-06-23 DIAGNOSIS — D509 Iron deficiency anemia, unspecified: Secondary | ICD-10-CM

## 2015-06-23 DIAGNOSIS — D649 Anemia, unspecified: Secondary | ICD-10-CM | POA: Diagnosis not present

## 2015-06-23 DIAGNOSIS — Q2733 Arteriovenous malformation of digestive system vessel: Secondary | ICD-10-CM | POA: Diagnosis not present

## 2015-06-23 LAB — POCT HEMOGLOBIN-HEMACUE: Hemoglobin: 7.8 g/dL — ABNORMAL LOW (ref 12.0–15.0)

## 2015-06-23 MED ORDER — DARBEPOETIN ALFA 200 MCG/0.4ML IJ SOSY
PREFILLED_SYRINGE | INTRAMUSCULAR | Status: DC
Start: 2015-06-23 — End: 2015-06-24
  Filled 2015-06-23: qty 0.4

## 2015-06-23 MED ORDER — DARBEPOETIN ALFA 200 MCG/0.4ML IJ SOSY
200.0000 ug | PREFILLED_SYRINGE | INTRAMUSCULAR | Status: DC
  Administered 2015-06-23: 200 ug via SUBCUTANEOUS

## 2015-06-26 ENCOUNTER — Ambulatory Visit (HOSPITAL_COMMUNITY)
Admission: RE | Admit: 2015-06-26 | Discharge: 2015-06-26 | Disposition: A | Discharge status: Home / Self Care | Payer: Medicare Other | Source: Ambulatory Visit | Attending: Gastroenterology | Admitting: Gastroenterology

## 2015-06-26 ENCOUNTER — Encounter (HOSPITAL_COMMUNITY): Payer: Self-pay

## 2015-06-26 VITALS — BP 126/65 | HR 58 | Temp 98.4°F | Resp 20 | Ht 62.5 in | Wt 229.0 lb

## 2015-06-26 DIAGNOSIS — Z0183 Encounter for blood typing: Secondary | ICD-10-CM | POA: Diagnosis not present

## 2015-06-26 DIAGNOSIS — D509 Iron deficiency anemia, unspecified: Secondary | ICD-10-CM

## 2015-06-26 DIAGNOSIS — N189 Chronic kidney disease, unspecified: Secondary | ICD-10-CM | POA: Insufficient documentation

## 2015-06-26 DIAGNOSIS — D500 Iron deficiency anemia secondary to blood loss (chronic): Secondary | ICD-10-CM | POA: Insufficient documentation

## 2015-06-26 MED ORDER — SODIUM CHLORIDE 0.9 % IV SOLN
Freq: Once | INTRAVENOUS | Status: AC
  Administered 2015-06-26: 250 mL via INTRAVENOUS

## 2015-06-26 NOTE — Progress Notes (Signed)
Uneventful infusion of 2 units PRBC. Pt arrived today with home O2 3/l. Lung sounds decreased with a few rales in left lung. She had taken her AM medication including her Lasix 40 mg and Aldactone. She had 1+edema of righ ankle. All this remained unchanged at the end of the infusion. Pt voices not c/o and is eager to go home.

## 2015-06-26 NOTE — Discharge Instructions (Signed)
Blood Transfusion, Care After °Refer to this sheet in the next few weeks. These instructions provide you with information about caring for yourself after your procedure. Your health care provider may also give you more specific instructions. Your treatment has been planned according to current medical practices, but problems sometimes occur. Call your health care provider if you have any problems or questions after your procedure. °WHAT TO EXPECT AFTER THE PROCEDURE °After your procedure, it is common to have: °· Bruising and soreness at the IV site. °· Chills or fever. °· Headache. °HOME CARE INSTRUCTIONS °· Take medicines only as directed by your health care provider. Ask your health care provider if you can take an over-the-counter pain reliever in case you have a fever or headache a day or two after your transfusion. °· Return to your normal activities as directed by your health care provider. °SEEK MEDICAL CARE IF:  °· You develop redness or irritation at your IV site. °· You have persistent fever, chills, or headache. °· Your urine is darker than normal. °· Your urine turns pink, red, or brown.   °· The white part of your eye turns yellow (jaundice).   °· You feel weak after doing your normal activities.   °SEEK IMMEDIATE MEDICAL CARE IF:  °· You have trouble breathing. °· You have fever and chills along with: °¨ Anxiety. °¨ Chest or back pain. °¨ Flushed skin. °¨ Clammy skin. °¨ A rapid heartbeat. °¨ Nausea. °  °This information is not intended to replace advice given to you by your health care provider. Make sure you discuss any questions you have with your health care provider. °  °Document Released: 09/02/2014 Document Reviewed: 09/02/2014 °Elsevier Interactive Patient Education ©2016 [employer] Inc[employer] ° °

## 2015-06-27 ENCOUNTER — Other Ambulatory Visit: Payer: Self-pay | Admitting: *Deleted

## 2015-06-27 ENCOUNTER — Encounter (HOSPITAL_COMMUNITY)
Admission: RE | Disposition: A | Discharge status: Home / Self Care | Payer: Self-pay | Source: Ambulatory Visit | Attending: Gastroenterology

## 2015-06-27 ENCOUNTER — Encounter (HOSPITAL_COMMUNITY): Payer: Self-pay

## 2015-06-27 ENCOUNTER — Ambulatory Visit (HOSPITAL_COMMUNITY): Payer: Medicare Other | Admitting: Registered Nurse

## 2015-06-27 ENCOUNTER — Telehealth: Payer: Self-pay | Admitting: Pulmonary Disease

## 2015-06-27 ENCOUNTER — Telehealth: Payer: Self-pay | Admitting: *Deleted

## 2015-06-27 ENCOUNTER — Ambulatory Visit (HOSPITAL_COMMUNITY)
Admission: RE | Admit: 2015-06-27 | Discharge: 2015-06-27 | Disposition: A | Discharge status: Home / Self Care | Payer: Medicare Other | Source: Ambulatory Visit | Attending: Gastroenterology | Admitting: Gastroenterology

## 2015-06-27 DIAGNOSIS — I110 Hypertensive heart disease with heart failure: Secondary | ICD-10-CM | POA: Diagnosis not present

## 2015-06-27 DIAGNOSIS — K3189 Other diseases of stomach and duodenum: Secondary | ICD-10-CM | POA: Insufficient documentation

## 2015-06-27 DIAGNOSIS — J449 Chronic obstructive pulmonary disease, unspecified: Secondary | ICD-10-CM | POA: Insufficient documentation

## 2015-06-27 DIAGNOSIS — K219 Gastro-esophageal reflux disease without esophagitis: Secondary | ICD-10-CM | POA: Diagnosis not present

## 2015-06-27 DIAGNOSIS — G4733 Obstructive sleep apnea (adult) (pediatric): Secondary | ICD-10-CM | POA: Insufficient documentation

## 2015-06-27 DIAGNOSIS — F419 Anxiety disorder, unspecified: Secondary | ICD-10-CM | POA: Diagnosis not present

## 2015-06-27 DIAGNOSIS — K449 Diaphragmatic hernia without obstruction or gangrene: Secondary | ICD-10-CM | POA: Diagnosis not present

## 2015-06-27 DIAGNOSIS — I350 Nonrheumatic aortic (valve) stenosis: Secondary | ICD-10-CM | POA: Insufficient documentation

## 2015-06-27 DIAGNOSIS — Z7951 Long term (current) use of inhaled steroids: Secondary | ICD-10-CM | POA: Diagnosis not present

## 2015-06-27 DIAGNOSIS — K5520 Angiodysplasia of colon without hemorrhage: Secondary | ICD-10-CM | POA: Insufficient documentation

## 2015-06-27 DIAGNOSIS — M1990 Unspecified osteoarthritis, unspecified site: Secondary | ICD-10-CM | POA: Diagnosis not present

## 2015-06-27 DIAGNOSIS — K31811 Angiodysplasia of stomach and duodenum with bleeding: Secondary | ICD-10-CM | POA: Insufficient documentation

## 2015-06-27 DIAGNOSIS — K7581 Nonalcoholic steatohepatitis (NASH): Secondary | ICD-10-CM | POA: Diagnosis not present

## 2015-06-27 DIAGNOSIS — B3781 Candidal esophagitis: Secondary | ICD-10-CM | POA: Insufficient documentation

## 2015-06-27 DIAGNOSIS — Z87891 Personal history of nicotine dependence: Secondary | ICD-10-CM | POA: Insufficient documentation

## 2015-06-27 DIAGNOSIS — Z79891 Long term (current) use of opiate analgesic: Secondary | ICD-10-CM | POA: Diagnosis not present

## 2015-06-27 DIAGNOSIS — Z9981 Dependence on supplemental oxygen: Secondary | ICD-10-CM | POA: Insufficient documentation

## 2015-06-27 DIAGNOSIS — Z79899 Other long term (current) drug therapy: Secondary | ICD-10-CM | POA: Insufficient documentation

## 2015-06-27 DIAGNOSIS — D649 Anemia, unspecified: Secondary | ICD-10-CM

## 2015-06-27 DIAGNOSIS — Z86010 Personal history of colonic polyps: Secondary | ICD-10-CM | POA: Insufficient documentation

## 2015-06-27 DIAGNOSIS — M797 Fibromyalgia: Secondary | ICD-10-CM | POA: Insufficient documentation

## 2015-06-27 DIAGNOSIS — D500 Iron deficiency anemia secondary to blood loss (chronic): Secondary | ICD-10-CM | POA: Insufficient documentation

## 2015-06-27 DIAGNOSIS — I2510 Atherosclerotic heart disease of native coronary artery without angina pectoris: Secondary | ICD-10-CM | POA: Diagnosis not present

## 2015-06-27 DIAGNOSIS — K766 Portal hypertension: Secondary | ICD-10-CM | POA: Insufficient documentation

## 2015-06-27 DIAGNOSIS — E119 Type 2 diabetes mellitus without complications: Secondary | ICD-10-CM | POA: Diagnosis not present

## 2015-06-27 DIAGNOSIS — R21 Rash and other nonspecific skin eruption: Secondary | ICD-10-CM | POA: Diagnosis not present

## 2015-06-27 DIAGNOSIS — K8689 Other specified diseases of pancreas: Secondary | ICD-10-CM | POA: Diagnosis not present

## 2015-06-27 DIAGNOSIS — K31819 Angiodysplasia of stomach and duodenum without bleeding: Secondary | ICD-10-CM

## 2015-06-27 DIAGNOSIS — E7800 Pure hypercholesterolemia, unspecified: Secondary | ICD-10-CM | POA: Diagnosis not present

## 2015-06-27 DIAGNOSIS — Z6841 Body Mass Index (BMI) 40.0 and over, adult: Secondary | ICD-10-CM | POA: Diagnosis not present

## 2015-06-27 DIAGNOSIS — Z794 Long term (current) use of insulin: Secondary | ICD-10-CM | POA: Diagnosis not present

## 2015-06-27 DIAGNOSIS — I252 Old myocardial infarction: Secondary | ICD-10-CM | POA: Diagnosis not present

## 2015-06-27 DIAGNOSIS — I5032 Chronic diastolic (congestive) heart failure: Secondary | ICD-10-CM | POA: Diagnosis not present

## 2015-06-27 DIAGNOSIS — J45909 Unspecified asthma, uncomplicated: Secondary | ICD-10-CM | POA: Insufficient documentation

## 2015-06-27 DIAGNOSIS — K7460 Unspecified cirrhosis of liver: Secondary | ICD-10-CM | POA: Diagnosis not present

## 2015-06-27 DIAGNOSIS — E118 Type 2 diabetes mellitus with unspecified complications: Secondary | ICD-10-CM | POA: Diagnosis not present

## 2015-06-27 HISTORY — DX: Inflammatory liver disease, unspecified: K75.9

## 2015-06-27 HISTORY — PX: ENTEROSCOPY: SHX5533

## 2015-06-27 LAB — TYPE AND SCREEN
ABO/RH(D): O POS
Antibody Screen: NEGATIVE
DONOR AG TYPE: NEGATIVE
DONOR AG TYPE: NEGATIVE
UNIT DIVISION: 0
UNIT DIVISION: 0

## 2015-06-27 LAB — GLUCOSE, CAPILLARY
Glucose-Capillary: 178 mg/dL — ABNORMAL HIGH (ref 65–99)
Glucose-Capillary: 148 mg/dL — ABNORMAL HIGH (ref 65–99)

## 2015-06-27 SURGERY — ENTEROSCOPY
Anesthesia: Monitor Anesthesia Care

## 2015-06-27 MED ORDER — PROPOFOL 10 MG/ML IV BOLUS
INTRAVENOUS | Status: AC
  Filled 2015-06-27: qty 20

## 2015-06-27 MED ORDER — PROPOFOL 10 MG/ML IV BOLUS
INTRAVENOUS | Status: AC
Start: 2015-06-27 — End: 2015-06-27
  Filled 2015-06-27: qty 20

## 2015-06-27 MED ORDER — LACTATED RINGERS IV SOLN
INTRAVENOUS | Status: DC | PRN
  Administered 2015-06-27: 08:00:00 via INTRAVENOUS

## 2015-06-27 MED ORDER — SODIUM CHLORIDE 0.9 % IV SOLN: INTRAVENOUS | Status: DC

## 2015-06-27 MED ORDER — LIDOCAINE HCL (CARDIAC) 20 MG/ML IV SOLN
INTRAVENOUS | Status: DC | PRN
  Administered 2015-06-27: 50 mg via INTRAVENOUS

## 2015-06-27 MED ORDER — FLUCONAZOLE 200 MG PO TABS: ORAL_TABLET | ORAL | Status: DC

## 2015-06-27 MED ORDER — PROPOFOL 500 MG/50ML IV EMUL
INTRAVENOUS | Status: DC | PRN
  Administered 2015-06-27: 200 ug/kg/min via INTRAVENOUS

## 2015-06-27 NOTE — Anesthesia Postprocedure Evaluation (Signed)
  Anesthesia Post-op Note  Patient: [name]  Procedure(s) Performed: Procedure(s) (LRB): ENTEROSCOPY (N/A)  Patient Location: PACU  Anesthesia Type: MAC  Level of Consciousness: awake and alert   Airway and Oxygen Therapy: Patient Spontanous Breathing  Post-op Pain: mild  Post-op Assessment: Post-op Vital signs reviewed, Patient's Cardiovascular Status Stable, Respiratory Function Stable, Patent Airway and No signs of Nausea or vomiting  Last Vitals:  Filed Vitals:   06/27/15 0930  BP:   Pulse: 51  Temp:   Resp: 16    Post-op Vital Signs: stable   Complications: No apparent anesthesia complications

## 2015-06-27 NOTE — Telephone Encounter (Signed)
Called and spoke to pt's husband. Informed him we do not have advair samples at this time for the pt. Pt's husband verbalized understanding and denied any further questions or concerns at this time.

## 2015-06-27 NOTE — Telephone Encounter (Signed)
Per procedure note, needs CBC repeated in 2 weeks. Order in [place]. Called [name] and left a message for her to call back.

## 2015-06-27 NOTE — Transfer of Care (Signed)
Immediate Anesthesia Transfer of Care Note  Patient: [name]  Procedure(s) Performed: Procedure(s): ENTEROSCOPY (N/A)  Patient Location: PACU  Anesthesia Type:MAC  Level of Consciousness: awake, alert , oriented and patient cooperative  Airway & Oxygen Therapy: Patient Spontanous Breathing and Patient connected to nasal cannula oxygen  Post-op Assessment: Report given to RN, Post -op Vital signs reviewed and stable and Patient moving all extremities X 4  Post vital signs: stable  Last Vitals:  Filed Vitals:   06/27/15 0901  BP:   Pulse: 51  Resp: 16    Complications: No apparent anesthesia complications

## 2015-06-27 NOTE — Interval H&P Note (Signed)
History and Physical Interval Note:  06/27/2015 8:07 AM  [name]  has presented today for surgery, with the diagnosis of Cirrhosis, Anemia  The various methods of treatment have been discussed with the patient and family. After consideration of risks, benefits and other options for treatment, the patient has consented to  Procedure(s): ESOPHAGOGASTRODUODENOSCOPY (EGD) WITH PROPOFOL (N/A) as a surgical intervention .  The patient's history has been reviewed, patient examined, no change in status, stable for surgery.  I have reviewed the patient's chart and labs.  Questions were answered to the patient's satisfaction.     Renelda Loma [name]

## 2015-06-27 NOTE — Discharge Instructions (Addendum)

## 2015-06-27 NOTE — Telephone Encounter (Signed)
Spoke with Kayleen Memos and she will draw labs on patient. Lab in [facility].

## 2015-06-27 NOTE — Anesthesia Preprocedure Evaluation (Addendum)
Anesthesia Evaluation  Patient identified by MRN, date of birth, ID band Patient awake    Reviewed: Allergy & Precautions, H&P , NPO status , Patient's Chart, lab work & pertinent test results  History of Anesthesia Complications (+) PONV  Airway Mallampati: III  TM Distance: >3 FB Neck ROM: full    Dental  (+) Dental Advisory Given, Edentulous Upper, Edentulous Lower   Pulmonary shortness of breath, asthma , sleep apnea , COPD,  COPD inhaler, former smoker,  chronicbronchitis   Pulmonary exam normal breath sounds clear to auscultation       Cardiovascular Exercise Tolerance: Good hypertension, Pt. on home beta blockers and Pt. on medications + CAD and +CHF  Normal cardiovascular exam+ Valvular Problems/Murmurs AS  Rhythm:regular Rate:Normal  MildAS. Chronic diastolic heart failure. EF 55%. Non-obstructive CAD.   Neuro/Psych Anxiety Depression negative neurological ROS  negative psych ROS   GI/Hepatic negative GI ROS, GERD  Medicated and Controlled,(+) Cirrhosis       , Hepatitis -, Unspecified  Endo/Other  diabetes, Well Controlled, Type 2, Insulin DependentMorbid obesity  Renal/GU negative Renal ROS  negative genitourinary   Musculoskeletal  (+) Arthritis , Fibromyalgia -  Abdominal (+) + obese,   Peds  Hematology negative hematology ROS (+) anemia , Hg b 8.6   Anesthesia Other Findings   Reproductive/Obstetrics negative OB ROS                           Anesthesia Physical Anesthesia Plan  ASA: III  Anesthesia Plan: MAC   Post-op Pain Management:    Induction:   Airway Management Planned:   Additional Equipment:   Intra-op Plan:   Post-operative Plan:   Informed Consent:   Plan Discussed with: Surgeon  Anesthesia Plan Comments:         Anesthesia Quick Evaluation

## 2015-06-27 NOTE — Op Note (Signed)
[facility] [place], [address]   ENTEROSCOPY PROCEDURE REPORT     EXAM DATE: 06/27/2015  PATIENT NAME:      [name], [name]           MR #:      [id number]  BIRTHDATE:       07-Mar-1941      VISIT #:     [id number]  ATTENDING:     Yetta Flock, MD     STATUS:     outpatient  ASSISTANT:      Sharon Mt and Doran Heater MD: ASA CLASS:        Class III  INDICATIONS:  The patient is a 74 yr old female here for an enteroscopy procedure due to follow up GAVE, anemia. PROCEDURE PERFORMED:     Small bowel enteroscopy with ablation therapy  MEDICATIONS:     Per Anesthesia  CONSENT: The patient understands the risks and benefits of the procedure and understands that these risks include, but are not limited to: sedation, allergic reaction, infection, perforation and/or bleeding. Alternative means of evaluation and treatment include, among others: physical exam, x-rays, and/or surgical intervention. The patient elects to proceed with this endoscopic procedure.  DESCRIPTION OF PROCEDURE: During intra-op preparation period all mechanical & medical equipment was checked for proper function. Hand hygiene and appropriate measures for infection prevention was taken. After the risks, benefits and alternatives of the procedure were thoroughly explained, Informed consent was verified, confirmed and timeout was successfully executed by the treatment team. The    endoscope was introduced through the mouth and advanced to the proximal jejunum jejunum. The prep was The overall prep quality was adequate.. The instrument was then slowly withdrawn while examining the mucosa circumferentially. The scope was then completely withdrawn from the patient and the procedure terminated. The pulse, BP, and O2 saturation were monitored and documented by the physician and the nursing staff throughout the entire procedure.  The patient was cared  for as planned according to standard protocol, then discharged to recovery in stable condition and with appropriate post procedure care. Estimated blood loss is zero unless otherwise noted in this procedure report.  FINDINGS: There was evidence of esophageal candidiasis in the proximal esophagus.  The mid and distal esophagus were normal without esophageal varices.  DH, GEJ, and SCJ were located 40cm from the incisors.  The stomach was noted for changes concerning for GAVE, most prominent in the gastric antrum.  There were multiple areas of ulcerated mucosa in the affected area with evidence of slow oozing in the antrum / pylorus.  These areas were treated with APC with good result.  2 moderate sized AVMS were noted in the gastric body and treated with APC.  There was evidence of suspected portal hypertensive gastropathy as well.  The duodenal bulb appeared normal, with normal remainder of examined duodenum. The proximal jejunum was reached.  No obvious or large AVMs were appreciated in the small bowel.    ADVERSE EVENTS:      There were no immediate complications.  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

## 2015-06-27 NOTE — H&P (View-Only) (Signed)
HPI :  74 y/o female here for follow up for NASH related cirrhosis and GAVE. Cirrhosis reportedly complicated by history of hepatic encephalopathy. She also recently had an abnormal MRCP.  The patient's main issue in related to portal hypertension has been GAVE. She has had longstanding anemia due to this issue, and has been having blood transfusions periodically for ongoing anemia. She has had 4-5 PRBC transfusions over the year at least, and a few in recent months. She reports her stools are usually brown but can see dark black in the stools intermittently - this is unchanged from our last visit. She is having 2-3 BMs per day. She reports color change is intermittent. Last PRBC transfusion was in August. Her Hgb today is 8, lower than in previous weeks. She is on nadolol and takes lactulose as needed, but she does not take it routinely due to diarrhea. She states she is eating okay, no routine vomiting or abdominal pains. Regarding her functional capacity she is very limited in her ability to ambulate. She is oxygen dependant for her COPD. She walks with a walker around home, but outside of home not much ambulation, uses wheelchair. No prior CAD / CVA. She does not take NSAIDs or anticoagulants. She denies chest pains. She is scheduled for an EGD with me on November 1st.   She had an MRCP as outlined below showing pancreatic ductal dilation since the last visit and we discussed what this entailed. She has intermittent epigastric discomfort. No weight loss. No FH of pancreatic cancer.   She otherwise has multiple areas of healing ulceration on her lower extremities. She reports it does not itch but family present today is concern she is picking at her legs or scratching it. No fevers. She has not seen a dermatologist.  Her most recent endoscopies are as follows: 02/25/15 - enteroscopy with APC of GAVE and small bowel AVMs 10/23/14 - ablation of GAVE via APC 02/08/13 - capsule endoscopy with small bowel  AVMs, all small   Past Medical History  Diagnosis Date  . Chronic airway obstruction, not elsewhere classified   . Unspecified chronic bronchitis ([place])   . Unspecified essential hypertension   . Non-obstructive CAD   . Palpitations   . Mild aortic stenosis     a. 03/2014 Valve area (VTI): 2.89 cm^2, Valve area (Vmax): 2.7 cm^2.  . Pure hypercholesterolemia   . Morbid obesity ([place])   . Esophageal reflux   . GAVE (gastric antral vascular ectasia)     angiodysplasia  . Diverticulosis of colon (without mention of hemorrhage)   . Benign neoplasm of colon   . Osteoarthrosis, unspecified whether generalized or localized, unspecified site   . Osteoporosis, unspecified   . Restless legs syndrome (RLS)   . Major depressive disorder, recurrent episode, severe, specified as with psychotic behavior   . Iron deficiency anemia secondary to blood loss (chronic)     a. frequent PRBC transfusions.  . Coarse tremors     a. arms.  . Carpal tunnel syndrome on both sides   . Chronic diastolic CHF (congestive heart failure) ([place])     a. 03/26/2014 Echo: EF 55-60%, no rwma, mild AS/AI, mod-sev Ca2+ MV annulus, mildly to mod dil LA.  [name] Asthma   . Type II diabetes mellitus ([place])   . H/O hiatal hernia   . 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 dx'd 1990's  . Adenomatous colon polyp   . Midsternal chest pain  a. conservatively managed ->poor candidate for cath/anticoagulation.  [name] Anxiety   . Portal hypertensive gastropathy   . Falls frequently     completed HHPT/OT 06/2014, PT unmet goals  . Recurrent UTI (urinary tract infection)     h/o hospitalization with urosepsis 2015, but thought large component colonization/bacteriuria, only treat if symptomatic ([place])  . Hiatal hernia   . Thrombocytopenia ([place])   . Protein calorie malnutrition ([place])   . History of pneumonia     CAP 2015  . On home oxygen therapy     "3L; 24/7" (01/25/2015)  . OSA (obstructive sleep  apnea)     a. does not use CPAP. (01/25/2015)  . GAVE (gastric antral vascular ectasia)      Past Surgical History  Procedure Laterality Date  . Hemorroidectomy    . Appendectomy    . Vaginal hysterectomy    . Breast biopsy      bilateral  . Cesarean section      x 3  . Appendectomy    . Esophagogastroduodenoscopy  06/12/2012    Procedure: ESOPHAGOGASTRODUODENOSCOPY (EGD);  Surgeon: Lafayette Dragon, MD;  Location: [facility];  Service: Endoscopy;  Laterality: N/A;  . Balloon dilation  06/12/2012    Procedure: BALLOON DILATION;  Surgeon: Lafayette Dragon, MD;  Location: [facility];  Service: Endoscopy;  Laterality: N/A;  ?balloon  . Esophagogastroduodenoscopy  09/10/2012    Procedure: ESOPHAGOGASTRODUODENOSCOPY (EGD);  Surgeon: Lafayette Dragon, MD;  Location: [facility];  Service: Endoscopy;  Laterality: N/A;  . Hot hemostasis  09/10/2012    Procedure: HOT HEMOSTASIS (ARGON PLASMA COAGULATION/BICAP);  Surgeon: Lafayette Dragon, MD;  Location: [facility];  Service: Endoscopy;  Laterality: N/A;  . Esophagogastroduodenoscopy N/A 01/18/2013    Procedure: ESOPHAGOGASTRODUODENOSCOPY (EGD);  Surgeon: Lafayette Dragon, MD;  Location: [facility]COPY;  Service: Endoscopy;  Laterality: N/A;  . Esophagogastroduodenoscopy N/A 05/24/2013    Procedure: ESOPHAGOGASTRODUODENOSCOPY (EGD);  Surgeon: Jerene Bears, MD;  Location: [place];  Service: Gastroenterology;  Laterality: N/A;  . Hot hemostasis N/A 05/24/2013    Procedure: HOT HEMOSTASIS (ARGON PLASMA COAGULATION/BICAP);  Surgeon: Jerene Bears, MD;  Location: [place];  Service: Gastroenterology;  Laterality: N/A;  . US echocardiography  07/2014    mild LVH, EF 60-65%, normal wall motion, mild AR, mod dilated LA, mildly dilated RA, peak PA pressure 75mmHg  . Esophagogastroduodenoscopy N/A 10/20/2014    Procedure: ESOPHAGOGASTRODUODENOSCOPY (EGD);  Surgeon: Lafayette Dragon, MD;  Location: [facility];  Service: Endoscopy;  Laterality: N/A;  . Hot  hemostasis N/A 10/20/2014    Procedure: HOT HEMOSTASIS (ARGON PLASMA COAGULATION/BICAP);  Surgeon: Lafayette Dragon, MD;  Location: [facility];  Service: Endoscopy;  Laterality: N/A;  . Enteroscopy N/A 02/25/2015    Procedure: ENTEROSCOPY;  Surgeon: [name] Ada, MD;  Location: [place];  Service: Endoscopy;  Laterality: N/A;   Family History  Problem Relation Age of Onset  . Heart disease Mother   . Cervical cancer Mother   . Kidney disease Mother   . Diabetes Mother   . Breast cancer Sister   . Multiple sclerosis Sister   . Colon cancer Neg Hx   . Breast cancer Maternal Aunt     x 2  . Diabetes Father   . Diabetes Sister   . Multiple sclerosis Other   . Heart attack Mother   . Heart attack Father   . Stroke Daughter   . Stroke Daughter    Social History  Substance Use Topics  .  Smoking status: Former Smoker -- 1.50 packs/day for 25 years    Types: Cigarettes    Quit date: 08/26/1992  . Smokeless tobacco: Never Used  . Alcohol Use: No     Comment: 5/39/76 "last alcoholic drink was years ago"   Current Outpatient Prescriptions  Medication Sig Dispense Refill  . ADVAIR DISKUS 250-50 MCG/DOSE AEPB INHALE 1 PUFF INTO LUNGS TWICE A DAY 60 each 11  . albuterol (PROAIR HFA) 108 (90 BASE) MCG/ACT inhaler Inhale 2 puffs into the lungs every 6 (six) hours as needed for wheezing or shortness of breath.     . ALPRAZolam (XANAX) 1 MG tablet Take 1 tablet (1 mg total) by mouth every 8 (eight) hours as needed. for anxiety 30 tablet 0  . B Complex-C ([employer] B COMPLEX PLUS C) TABS TAKE ONE CAPSULE BY MOUTH EVERY DAY 30 tablet 5  . buPROPion (WELLBUTRIN XL) 150 MG 24 hr tablet Take 1 tablet (150 mg total) by mouth daily. 90 tablet 3  . cephALEXin (KEFLEX) 500 MG capsule TAKE ONE CAPSULE BY MOUTH TWICE A DAY X 14 DAYS  0  . clotrimazole (LOTRIMIN) 1 % cream Apply topically 2 (two) times daily as needed. (Patient taking differently: Apply 1 application topically 2 (two) times daily as needed  (fungal infection). ) 30 g 1  . darbepoetin (ARANESP) 200 MCG/0.4ML SOLN injection Inject 200 mcg into the skin every 14 (fourteen) days.    [name] desvenlafaxine (PRISTIQ) 50 MG 24 hr tablet Take 3 tablets (150 mg total) by mouth daily. 90 tablet 11  . furosemide (LASIX) 40 MG tablet Increase Lasix to 80 mg BID x 3 days then resume current dose. (Patient taking differently: Take 40 mg by mouth 2 (two) times daily. ) 30 tablet   . guaiFENesin (MUCINEX) 600 MG 12 hr tablet Take 600 mg by mouth 2 (two) times daily.     . Insulin Glargine (LANTUS SOLOSTAR) 100 UNIT/ML Solostar Pen Inject 120 Units into the skin every morning. 20 pen 11  . insulin lispro (HUMALOG KWIKPEN) 100 UNIT/ML KiwkPen Inject 10 Units into the skin daily with supper.    . lactulose, encephalopathy, (CHRONULAC) 10 GM/15ML SOLN Take 30 mLs (20 g total) by mouth 2 (two) times daily. constipation (Patient taking differently: Take 20 g by mouth 2 (two) times daily as needed (consitpation). ) 2700 mL 6  . levalbuterol (XOPENEX) 0.63 MG/3ML nebulizer solution Take 0.63 mg by nebulization every 8 (eight) hours as needed for wheezing or shortness of breath.   5  . loratadine (CLARITIN) 10 MG tablet TAKE 1 TABLET EVERY DAY 30 tablet 6  . Multiple Vitamins-Minerals (CENTRUM SILVER PO) Take 1 tablet by mouth daily.     . nadolol (CORGARD) 20 MG tablet Take 0.5 tablets (10 mg total) by mouth daily. (Patient taking differently: Take 5 mg by mouth daily. ) 15 tablet 1  . oxyCODONE (OXY IR/ROXICODONE) 5 MG immediate release tablet Take 1 tablet (5 mg total) by mouth every 6 (six) hours as needed for severe pain. 30 tablet 0  . pantoprazole (PROTONIX) 40 MG tablet Take 1 tablet (40 mg total) by mouth 2 (two) times daily. 60 tablet 6  . promethazine (PHENERGAN) 25 MG tablet Take 1 tablet (25 mg total) by mouth 2 (two) times daily as needed for nausea. 30 tablet 3  . QUEtiapine (SEROQUEL) 200 MG tablet Take 1 tablet (200 mg total) by mouth at bedtime. 30  tablet 6  . rOPINIRole (REQUIP) 4 MG tablet TAKE  1 TABLET EVERY DAY 30 tablet 6  . Saline (OCEAN NASAL SPRAY NA) Place 1 spray into the nose 3 (three) times daily as needed (congestion).     . simvastatin (ZOCOR) 40 MG tablet TAKE 1 TABLET EVERY DAY 30 tablet 11  . spironolactone (ALDACTONE) 25 MG tablet TAKE 0.5 TABLETS (12.5 MG TOTAL) BY MOUTH DAILY. 15 tablet 0  . Vitamin D, Ergocalciferol, (DRISDOL) 50000 UNITS CAPS capsule TAKE ONE CAPSULE WEEKLY (Patient taking differently: TAKE ONE CAPSULE WEEKLY. Friday) 4 capsule 6  . XIFAXAN 550 MG TABS tablet TAKE 1 TABLET TWICE A DAY 60 tablet 11   No current facility-administered medications for this visit.   Allergies  Allergen Reactions  . Acetaminophen Other (See Comments)    Liver dysfunction  . Aspirin Other (See Comments)    Causes nosebleeds  . Theophylline Nausea And Vomiting  . Nsaids     Liver dysfunction   . Penicillins Itching    Has patient had a PCN reaction causing immediate rash, facial/tongue/throat swelling, SOB or lightheadedness with hypotension: unknown Has patient had a PCN reaction causing severe rash involving mucus membranes or skin necrosis: unknown Has patient had a PCN reaction that required hospitalization unknown Has patient had a PCN reaction occurring within the last 10 years: yes If all of the above answers are "NO", then may proceed with Cephalosporin use.      Review of Systems: All systems reviewed and negative except where noted in HPI.   Lab Results  Component Value Date   WBC 3.3* 06/14/2015   HGB 8.0 Repeated and verified X2.* 06/14/2015   HCT 24.2* 06/14/2015   MCV 99.4 06/14/2015   PLT 78.0* 06/14/2015    Lab Results  Component Value Date   ALT 36 05/08/2015   AST 46* 05/08/2015   ALKPHOS 100 05/08/2015   BILITOT 1.3* 05/08/2015    Lab Results  Component Value Date   CREATININE 1.15* 05/08/2015   BUN 27* 05/08/2015   NA 139 05/08/2015   K 3.9 05/08/2015   CL 98* 05/08/2015     CO2 32 05/08/2015      US Abdomen Complete  05/18/2015  CLINICAL DATA:  Cirrhosis and anemia. History of hypertension and diabetes. Subsequent encounter. EXAM: ULTRASOUND ABDOMEN COMPLETE COMPARISON:  Abdominal ultrasound 02/02/2013. FINDINGS: Gallbladder: No gallstones or wall thickening visualized. No sonographic Murphy sign noted. Common bile duct: Diameter: 7 mm. Liver: Contour irregularity of the liver and heterogeneous echotexture again noted, similar to prior examination. No focal lesions observed. IVC: No abnormality visualized. Pancreas: The pancreatic duct appears dilated within the pancreatic head to 9 mm. This was not documented previously. No pancreatic mass or surrounding fluid collection observed. Spleen: Progressive splenomegaly. The spleen measures 16.9 x 16.6 x 7.8 cm for an estimated volume of 1138 ml. There is a stable small cystic lesion in the spleen. Right Kidney: Length: 11.4 cm. Echogenicity within normal limits. No mass or hydronephrosis visualized. Left Kidney: Length: 10.4 cm. Echogenicity within normal limits. No mass or hydronephrosis visualized. Abdominal aorta: No aneurysm visualized. Other findings: No ascites observed. IMPRESSION: 1. Grossly stable appearance of the liver with contour irregularity and heterogeneous echotexture consistent with cirrhosis. No focal lesions observed. 2. No evidence of gallbladder or biliary disease. 3. New pancreatic ductal dilatation of undetermined etiology. Is there a history of pancreatitis? CT or MRCP should be considered to exclude underlying occult pancreatic lesion. 4. Progressive splenomegaly, likely secondary to portal hypertension. Electronically Signed   By: Caryl Comes.D.  On: 05/18/2015 10:19   Mr 3d Recon At Scanner  05/24/2015  CLINICAL DATA:  NASH related cirrhosis. Pancreatic duct dilatation questioned on ultrasound. EXAM: MRI ABDOMEN WITHOUT AND WITH CONTRAST (INCLUDING MRCP) TECHNIQUE: Multiplanar multisequence MR  imaging of the abdomen was performed both before and after the administration of intravenous contrast. Heavily T2-weighted images of the biliary and pancreatic ducts were obtained, and three-dimensional MRCP images were rendered by post processing. CONTRAST:  67mL MULTIHANCE GADOBENATE DIMEGLUMINE 529 MG/ML IV SOLN COMPARISON:  Ultrasound of 05/18/2015 FINDINGS: Mild degradation secondary to patient body habitus and mild motion. Lower chest: Mild cardiomegaly, without pericardial or pleural effusion. Periesophageal varices, including on image#/series# 26/1102. Hepatobiliary: Mild hepatomegaly with moderate cirrhosis. 11 mm T2 hyperintense lesion demonstrates vague peripheral enhancement on both early and late post-contrast images. image#/series# 25/3 and 44/1104. Normal gallbladder, without biliary ductal dilatation. Pancreas: Pancreatic atrophy. Mild main duct dilatation within the body and tail. Moderate main duct dilatation within the head and neck. Example 9 mm on image#/series# 33/3. Areas of side branch duct ectasia, including on image#/series# 70/4. No dominant obstructive mass. No abnormal post-contrast enhancement. Spleen: Splenomegaly, 16.4 cm craniocaudal. Probable splenic cyst within, 1.0 cm. Adrenals/Urinary Tract: Minimal right adrenal nodularity. Normal left adrenal gland. Too small to characterize right renal lesion. Normal left kidney, without hydronephrosis. Stomach/Bowel: Proximal gastric underdistention. Large and small bowel loops unremarkable. Vascular/Lymphatic: Normal caliber of the aorta and branch vessels. Patent hepatic and portal veins. No retroperitoneal or retrocrural adenopathy. Other: No ascites. Musculoskeletal: No acute osseous abnormality. IMPRESSION: 1. Main and side branch pancreatic ductectasia with atrophy. Favor the sequelae of chronic pancreatitis. Given the extent of main branch dilatation in the head and neck, mixed intraductal papillary mucinous tumor could look similar  but is felt less likely. Consider follow-up with pre and post contrast abdominal MRI at 6 months. 2. Cirrhosis and portal venous hypertension, without evidence of hepatocellular carcinoma. 3. A right liver lobe lesion is favored due to represent a minimally complex cyst or an atypical hemangioma. 4. Decreased sensitivity and specificity exam due to technique related factors, as described above. Electronically Signed   By: Abigail Miyamoto M.D.   On: 05/24/2015 14:17   Mr Abd W/wo Cm/mrcp  05/24/2015  CLINICAL DATA:  NASH related cirrhosis. Pancreatic duct dilatation questioned on ultrasound. EXAM: MRI ABDOMEN WITHOUT AND WITH CONTRAST (INCLUDING MRCP) TECHNIQUE: Multiplanar multisequence MR imaging of the abdomen was performed both before and after the administration of intravenous contrast. Heavily T2-weighted images of the biliary and pancreatic ducts were obtained, and three-dimensional MRCP images were rendered by post processing. CONTRAST:  102mL MULTIHANCE GADOBENATE DIMEGLUMINE 529 MG/ML IV SOLN COMPARISON:  Ultrasound of 05/18/2015 FINDINGS: Mild degradation secondary to patient body habitus and mild motion. Lower chest: Mild cardiomegaly, without pericardial or pleural effusion. Periesophageal varices, including on image#/series# 26/1102. Hepatobiliary: Mild hepatomegaly with moderate cirrhosis. 11 mm T2 hyperintense lesion demonstrates vague peripheral enhancement on both early and late post-contrast images. image#/series# 25/3 and 44/1104. Normal gallbladder, without biliary ductal dilatation. Pancreas: Pancreatic atrophy. Mild main duct dilatation within the body and tail. Moderate main duct dilatation within the head and neck. Example 9 mm on image#/series# 33/3. Areas of side branch duct ectasia, including on image#/series# 70/4. No dominant obstructive mass. No abnormal post-contrast enhancement. Spleen: Splenomegaly, 16.4 cm craniocaudal. Probable splenic cyst within, 1.0 cm. Adrenals/Urinary Tract:  Minimal right adrenal nodularity. Normal left adrenal gland. Too small to characterize right renal lesion. Normal left kidney, without hydronephrosis. Stomach/Bowel: Proximal gastric underdistention. Large  and small bowel loops unremarkable. Vascular/Lymphatic: Normal caliber of the aorta and branch vessels. Patent hepatic and portal veins. No retroperitoneal or retrocrural adenopathy. Other: No ascites. Musculoskeletal: No acute osseous abnormality. IMPRESSION: 1. Main and side branch pancreatic ductectasia with atrophy. Favor the sequelae of chronic pancreatitis. Given the extent of main branch dilatation in the head and neck, mixed intraductal papillary mucinous tumor could look similar but is felt less likely. Consider follow-up with pre and post contrast abdominal MRI at 6 months. 2. Cirrhosis and portal venous hypertension, without evidence of hepatocellular carcinoma. 3. A right liver lobe lesion is favored due to represent a minimally complex cyst or an atypical hemangioma. 4. Decreased sensitivity and specificity exam due to technique related factors, as described above. Electronically Signed   By: Abigail Miyamoto M.D.   On: 05/24/2015 14:17    Physical Exam: BP 120/70 mmHg  Pulse 68  Temp(Src) 98.9 F (37.2 C) (Oral)  Ht 5' 2.5" (1.588 m)  Wt 232 lb 12.8 oz (105.597 kg)  BMI 41.87 kg/m2 Constitutional: Pleasant, female in no acute distress, oxygen [place] in place, sitting in wheelchair. HEENT: Normocephalic and atraumatic. Conjunctivae are pale. No scleral icterus. Neck supple.  Cardiovascular: Normal rate, regular rhythm. 2/6 SEM Pulmonary/chest: Effort normal and breath sounds normal. No wheezing, rales or rhonchi. Abdominal: Soft, protuberant, nontender. Bowel sounds active throughout. There are no masses palpable.  Extremities:trace edema B Lymphadenopathy: No cervical adenopathy noted. Neurological: Alert and oriented to person place and time. Skin: Skin is warm and dry. Multiple isolated  ulcerations, several with scabs, with mild surrounding erythema along the lower legs bilaterally, largest 1cm in size Psychiatric: Normal mood and affect. Behavior is normal.   ASSESSMENT AND PLAN: 74 y/o female with cirrhosis secondary to NASH, associated with GAVE and small bowel AVMs, which has led to ongoing anemia and blood transfusions. Last transfusion in August however Hgb has been drifting down in recent weeks, now to 8.0.  She has had 2 endoscopies this year for ablation of GAVE, and others in the past. She otherwise has significant co morbidities, including oxygen dependant COPD..  I had a lengthy discussion with Mrs. Salmons today to discuss her active issues, namely anemia / GAVE and MRCP findings. To prevent further anemia / GI tract blood loss she is scheduled for another enteroscopy with APC ablation. I discussed the risks / benefits of endoscopy and anesthesia with them at length. She is clearly a high risk patient in regards to her anesthesia risk, however given her recent past we can only assume without intervention she will continue to have anemia needing transfusion. Hopefully with endoscopy we can minimize her need for transfusion and hospitalization. Her Hgb since I have last seen her is slowly downtrending. I offered her a PRBC transfusion today but she wishes to hold off for as long as possible and states she usually "waits until I am in the 7s" given she otherwise feels at her baseline. Will check labs on Monday AM and coordinate it then if need be, in the interim if she feels poorly I asked her to contact us and we can set up a transfusion. I will resume oral iron at this time, she had self stopped it. Otherwise, there is evidence of using RFA / Halo device to treat refractory GAVE with good results in patient who fail APC. I think this would be useful if available. I discussed this with her, and while I asked her to keep her appointment for [facility] treatment  with me, I will refer her to  [facility] for evaluation for  RFA/Halo treatment of her refractory GAVE. If this can be done, hopefully it can minimize her transfusion requirement.   Otherwise, during her [place] screening on [place], she was noted to have nonspecific ductal dilation of the pancreas. MRCP obtained which confirmed this. She has had this on remote imaging but appears more prominent on this exam. No focal mass lesion is noted however the differential includes IPMN or mass lesion / malignancy of the pancreas. I discussed my concerns with the patient and options to further evaluate this moving forward, which include EUS vs. repeat MRCP in 6 months or so. She has significant comorbidities. If she had a pancreatic malignancy she would be considered to be a poor surgical candidate. Following our conversation she wishes to obtain MRCP in 6 months from her last exam, and if changes continue to progress she will then consider EUS.   Finally, regarding the patient's rash, will refer to derm. Advised her not to pick at it (suspect this could be the cause), can use vasoline PRN. Await derm consult. She agreed.   Micco Cellar, MD [facility] Gastroenterology Pager [phone]

## 2015-06-28 ENCOUNTER — Telehealth: Payer: Self-pay | Admitting: Gastroenterology

## 2015-06-28 ENCOUNTER — Encounter: Payer: Self-pay | Admitting: *Deleted

## 2015-06-28 ENCOUNTER — Ambulatory Visit (INDEPENDENT_AMBULATORY_CARE_PROVIDER_SITE_OTHER): Payer: Medicare Other | Admitting: Gastroenterology

## 2015-06-28 ENCOUNTER — Encounter (HOSPITAL_COMMUNITY): Payer: Self-pay | Admitting: Gastroenterology

## 2015-06-28 VITALS — BP 110/50 | HR 68 | Temp 99.0°F

## 2015-06-28 DIAGNOSIS — K31819 Angiodysplasia of stomach and duodenum without bleeding: Secondary | ICD-10-CM | POA: Diagnosis not present

## 2015-06-28 DIAGNOSIS — R1013 Epigastric pain: Secondary | ICD-10-CM

## 2015-06-28 DIAGNOSIS — K7460 Unspecified cirrhosis of liver: Secondary | ICD-10-CM | POA: Diagnosis not present

## 2015-06-28 MED ORDER — SUCRALFATE 1 G PO TABS: 1.0000 g | ORAL_TABLET | Freq: Four times a day (QID) | ORAL | Status: DC

## 2015-06-28 NOTE — Patient Instructions (Signed)
We have sent the following medications to your pharmacy for you to pick up at your convenience: Carafate   Please discontinue NSAIDS

## 2015-06-28 NOTE — Progress Notes (Signed)
HPI :  74 y/o female well known to our clinic. NASH cirrhosis, complicated by GAVE / portal hypertensive gastropathy and chronic anemia requiring transfusion, also with recent MRCP with PD dilation.   She is seen for an acute visit today. She had an EGD done yesterday with severe GAVE, portal hypertensive gastropathy noted. She was treated with APC. Incidentally noted was esophageal candidiasis. She was started on fluconazole following the procedure.   She reports yesterday evening she developing some epigastric discomfort which has come and gone intermittently today. She is eating okay without vomiting. She ate breakfast today without worsening of her pain. No fevers. She took a percocet she has at home which made her feel better. She has mild discomfort at this time. She has not had symptoms like this after prior EGDs. No dysphagia or odynophagia. She is avoiding NSAIDs. Last BM this AM, passed brown stool. Otherwise no chest pains or shortness of breath, otherwise feels well.     Past Medical History  Diagnosis Date  . Chronic airway obstruction, not elsewhere classified   . Unspecified chronic bronchitis ([place])   . Unspecified essential hypertension   . Non-obstructive CAD   . Palpitations   . Mild aortic stenosis     a. 03/2014 Valve area (VTI): 2.89 cm^2, Valve area (Vmax): 2.7 cm^2.  . Pure hypercholesterolemia   . Morbid obesity ([place])   . Esophageal reflux   . GAVE (gastric antral vascular ectasia)     angiodysplasia  . Diverticulosis of colon (without mention of hemorrhage)   . Benign neoplasm of colon   . Osteoarthrosis, unspecified whether generalized or localized, unspecified site   . Osteoporosis, unspecified   . Restless legs syndrome (RLS)   . Major depressive disorder, recurrent episode, severe, specified as with psychotic behavior   . Iron deficiency anemia secondary to blood loss (chronic)     a. frequent PRBC transfusions.  . Coarse tremors     a. arms.  .  Carpal tunnel syndrome on both sides   . Chronic diastolic CHF (congestive heart failure) ([place])     a. 03/26/2014 Echo: EF 55-60%, no rwma, mild AS/AI, mod-sev Ca2+ MV annulus, mildly to mod dil LA.  [name] Asthma   . Type II diabetes mellitus ([place])   . H/O hiatal hernia   . 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 dx'd 1990's  . Adenomatous colon polyp   . Midsternal chest pain     a. conservatively managed ->poor candidate for cath/anticoagulation.  [name] Anxiety   . Portal hypertensive gastropathy   . Falls frequently     completed HHPT/OT 06/2014, PT unmet goals  . Recurrent UTI (urinary tract infection)     h/o hospitalization with urosepsis 2015, but thought large component colonization/bacteriuria, only treat if symptomatic ([place])  . Hiatal hernia   . Thrombocytopenia ([place])   . Protein calorie malnutrition ([place])   . History of pneumonia     CAP 2015  . On home oxygen therapy     "3L; 24/7" (01/25/2015)  . GAVE (gastric antral vascular ectasia)   . OSA (obstructive sleep apnea)     a. does not use CPAP. (01/25/2015)  . Hepatitis      Past Surgical History  Procedure Laterality Date  . Hemorroidectomy    . Appendectomy    . Vaginal hysterectomy    . Breast biopsy      bilateral  . Cesarean section      x  3  . Appendectomy    . Esophagogastroduodenoscopy  06/12/2012    Procedure: ESOPHAGOGASTRODUODENOSCOPY (EGD);  Surgeon: Lafayette Dragon, MD;  Location: [facility];  Service: Endoscopy;  Laterality: N/A;  . Balloon dilation  06/12/2012    Procedure: BALLOON DILATION;  Surgeon: Lafayette Dragon, MD;  Location: [facility];  Service: Endoscopy;  Laterality: N/A;  ?balloon  . Esophagogastroduodenoscopy  09/10/2012    Procedure: ESOPHAGOGASTRODUODENOSCOPY (EGD);  Surgeon: Lafayette Dragon, MD;  Location: [facility];  Service: Endoscopy;  Laterality: N/A;  . Hot hemostasis  09/10/2012    Procedure: HOT HEMOSTASIS (ARGON PLASMA COAGULATION/BICAP);  Surgeon:  Lafayette Dragon, MD;  Location: [facility];  Service: Endoscopy;  Laterality: N/A;  . Esophagogastroduodenoscopy N/A 01/18/2013    Procedure: ESOPHAGOGASTRODUODENOSCOPY (EGD);  Surgeon: Lafayette Dragon, MD;  Location: [facility] ENDOSCOPY;  Service: Endoscopy;  Laterality: N/A;  . Esophagogastroduodenoscopy N/A 05/24/2013    Procedure: ESOPHAGOGASTRODUODENOSCOPY (EGD);  Surgeon: Jerene Bears, MD;  Location: [place];  Service: Gastroenterology;  Laterality: N/A;  . Hot hemostasis N/A 05/24/2013    Procedure: HOT HEMOSTASIS (ARGON PLASMA COAGULATION/BICAP);  Surgeon: Jerene Bears, MD;  Location: [place];  Service: Gastroenterology;  Laterality: N/A;  . US echocardiography  07/2014    mild LVH, EF 60-65%, normal wall motion, mild AR, mod dilated LA, mildly dilated RA, peak PA pressure 76mmHg  . Esophagogastroduodenoscopy N/A 10/20/2014    Procedure: ESOPHAGOGASTRODUODENOSCOPY (EGD);  Surgeon: Lafayette Dragon, MD;  Location: [facility];  Service: Endoscopy;  Laterality: N/A;  . Hot hemostasis N/A 10/20/2014    Procedure: HOT HEMOSTASIS (ARGON PLASMA COAGULATION/BICAP);  Surgeon: Lafayette Dragon, MD;  Location: [facility];  Service: Endoscopy;  Laterality: N/A;  . Enteroscopy N/A 02/25/2015    Procedure: ENTEROSCOPY;  Surgeon: [name] Ada, MD;  Location: [place];  Service: Endoscopy;  Laterality: N/A;  . Enteroscopy N/A 06/27/2015    Procedure: ENTEROSCOPY;  Surgeon: Manus Gunning, MD;  Location: [facility];  Service: Gastroenterology;  Laterality: N/A;   Family History  Problem Relation Age of Onset  . Heart disease Mother   . Cervical cancer Mother   . Kidney disease Mother   . Diabetes Mother   . Breast cancer Sister   . Multiple sclerosis Sister   . Colon cancer Neg Hx   . Breast cancer Maternal Aunt     x 2  . Diabetes Father   . Diabetes Sister   . Multiple sclerosis Other   . Heart attack Mother   . Heart attack Father   . Stroke Daughter   . Stroke Daughter    Social  History  Substance Use Topics  . Smoking status: Former Smoker -- 1.50 packs/day for 25 years    Types: Cigarettes    Quit date: 08/26/1992  . Smokeless tobacco: Never Used  . Alcohol Use: No     Comment: 6/73/41 "last alcoholic drink was years ago"   Current Outpatient Prescriptions  Medication Sig Dispense Refill  . ADVAIR DISKUS 250-50 MCG/DOSE AEPB INHALE 1 PUFF INTO LUNGS TWICE A DAY 60 each 11  . albuterol (PROAIR HFA) 108 (90 BASE) MCG/ACT inhaler Inhale 2 puffs into the lungs every 6 (six) hours as needed for wheezing or shortness of breath.     . ALPRAZolam (XANAX) 1 MG tablet Take 1 tablet (1 mg total) by mouth every 8 (eight) hours as needed. for anxiety 30 tablet 0  . B Complex-C ([employer] B COMPLEX PLUS C) TABS TAKE ONE CAPSULE BY  MOUTH EVERY DAY 30 tablet 5  . buPROPion (WELLBUTRIN XL) 150 MG 24 hr tablet Take 1 tablet (150 mg total) by mouth daily. 90 tablet 3  . clotrimazole (LOTRIMIN) 1 % cream Apply topically 2 (two) times daily as needed. (Patient taking differently: Apply 1 application topically 2 (two) times daily as needed (fungal infection). ) 30 g 1  . darbepoetin (ARANESP) 200 MCG/0.4ML SOLN injection Inject 200 mcg into the skin every 14 (fourteen) days.    [name] desvenlafaxine (PRISTIQ) 50 MG 24 hr tablet Take 3 tablets (150 mg total) by mouth daily. 90 tablet 11  . ferrous sulfate 325 (65 FE) MG tablet Take 1 tablet (325 mg total) by mouth daily with breakfast. 90 tablet 1  . fluconazole (DIFLUCAN) 200 MG tablet 2 tabs po x 1 on day 1, then 1 tab daily for 13 days 15 tablet 0  . furosemide (LASIX) 40 MG tablet Increase Lasix to 80 mg BID x 3 days then resume current dose. (Patient taking differently: Take 40 mg by mouth 2 (two) times daily. ) 30 tablet   . guaiFENesin (MUCINEX) 600 MG 12 hr tablet Take 600 mg by mouth 2 (two) times daily.     . Insulin Glargine (LANTUS SOLOSTAR) 100 UNIT/ML Solostar Pen Inject 120 Units into the skin every morning. 20 pen 11  . insulin  lispro (HUMALOG KWIKPEN) 100 UNIT/ML KiwkPen Inject 10 Units into the skin daily with supper.    . lactulose, encephalopathy, (CHRONULAC) 10 GM/15ML SOLN Take 30 mLs (20 g total) by mouth 2 (two) times daily. constipation (Patient taking differently: Take 20 g by mouth 2 (two) times daily as needed (consitpation). ) 2700 mL 6  . levalbuterol (XOPENEX) 0.63 MG/3ML nebulizer solution Take 0.63 mg by nebulization every 8 (eight) hours as needed for wheezing or shortness of breath.   5  . loratadine (CLARITIN) 10 MG tablet TAKE 1 TABLET EVERY DAY 30 tablet 6  . Multiple Vitamins-Minerals (CENTRUM SILVER PO) Take 1 tablet by mouth daily.     . nadolol (CORGARD) 20 MG tablet Take as directed- (take 1/4 tablet (5 mg) daily due to bradycardia)-per cardiology 30 tablet 1  . oxyCODONE (OXY IR/ROXICODONE) 5 MG immediate release tablet Take 1 tablet (5 mg total) by mouth every 6 (six) hours as needed for severe pain. 30 tablet 0  . pantoprazole (PROTONIX) 40 MG tablet Take 1 tablet (40 mg total) by mouth 2 (two) times daily. 60 tablet 6  . promethazine (PHENERGAN) 25 MG tablet Take 1 tablet (25 mg total) by mouth 2 (two) times daily as needed for nausea. 30 tablet 3  . QUEtiapine (SEROQUEL) 200 MG tablet Take 1 tablet (200 mg total) by mouth at bedtime. 30 tablet 6  . rOPINIRole (REQUIP) 4 MG tablet TAKE 1 TABLET EVERY DAY 30 tablet 6  . Saline (OCEAN NASAL SPRAY NA) Place 1 spray into the nose 3 (three) times daily as needed (congestion).     . simvastatin (ZOCOR) 40 MG tablet TAKE 1 TABLET EVERY DAY 30 tablet 11  . spironolactone (ALDACTONE) 25 MG tablet TAKE 0.5 TABLETS (12.5 MG TOTAL) BY MOUTH DAILY. 15 tablet 0  . Vitamin D, Ergocalciferol, (DRISDOL) 50000 UNITS CAPS capsule TAKE ONE CAPSULE WEEKLY (Patient taking differently: TAKE ONE CAPSULE WEEKLY. Friday) 4 capsule 6  . XIFAXAN 550 MG TABS tablet TAKE 1 TABLET TWICE A DAY 60 tablet 11  . sucralfate (CARAFATE) 1 G tablet Take 1 tablet (1 g total) by  mouth every  6 (six) hours. 28 tablet 0   No current facility-administered medications for this visit.   Allergies  Allergen Reactions  . Acetaminophen Other (See Comments)    Liver dysfunction  . Aspirin Other (See Comments)    Causes nosebleeds  . Theophylline Nausea And Vomiting  . Nsaids     Liver dysfunction   . Penicillins Itching    Has patient had a PCN reaction causing immediate rash, facial/tongue/throat swelling, SOB or lightheadedness with hypotension: unknown Has patient had a PCN reaction causing severe rash involving mucus membranes or skin necrosis: unknown Has patient had a PCN reaction that required hospitalization unknown Has patient had a PCN reaction occurring within the last 10 years: yes If all of the above answers are "NO", then may proceed with Cephalosporin use.      Review of Systems: All systems reviewed and negative except where noted in HPI.   Lab Results  Component Value Date   WBC 3.4* 06/22/2015   HGB 7.8* 06/23/2015   HCT 23.7 Repeated and verified X2.* 06/22/2015   MCV 100.8* 06/22/2015   PLT 72.0* 06/22/2015   CBC pre-transfusion    Physical Exam: BP 110/50 mmHg  Pulse 68  Temp(Src) 99 F (37.2 C)  Ht   Wt  Constitutional: Pleasant,well-developed, female in no acute distress. Nasal canula in place, home oxygen. HEENT: Normocephalic and atraumatic. Conjunctivae are normal. No scleral icterus. Neck supple.  Cardiovascular: Normal rate, regular rhythm.  Pulmonary/chest: Effort normal and breath sounds normal. No wheezing, rales or rhonchi. Abdominal: Soft, protuberant / obese abdomen, nontender. Bowel sounds active throughout. There are no masses palpable.  Extremities: (+) 1 edema LE B Lymphadenopathy: No cervical adenopathy noted. Neurological: Alert and oriented to person place and time. Skin: Skin is warm and dry. No rashes noted. Psychiatric: Normal mood and affect. Behavior is normal.   ASSESSMENT AND PLAN: 74 y/o female  with cirrhosis secondary to NASH, associated with GAVE and small bowel AVMs, which has led to ongoing anemia and blood transfusions. Last transfusion was a few weeks ago and given ongoing anemia a repeat EGD was done yesterday with APC treatment of GAVE. She also had incidentally noted esophageal candidiasis. Small bowel appeared normal. History as above, some abdominal discomfort today which started last night. She is eating okay, no vomiting, no fevers. She is avoiding NSAIDs and taking protonix BID as directed. Her abdomen is soft today and was not tender. She states her symptoms are intermittent. Suspect she may have had some sensitivity to the APC from yesterday. Passed brown stool today, no suspicion for bleeding at this time. Her vitals are stable and at baseline.   Recommend she take liquid Carafate 10cc every 6 hours at this time to see if this helps in addition to continuing her PPI. She states she has a supply of this at home, she took it remotely but not recently. She states she does not need another prescription. She can stay on a liquid diet today at this time and advance as tolerated. I asked her to closely monitor symptoms. If she has worsening pain, or develops vomiting / intolerance of PO or fevers I asked her to call me for reassessment. Given stable vitals and exam with benign abdomen, I don't think she has a perforation or other significant complication from her endoscopy, more likely a reaction to the APC. She and husband agreed with plan and will f/u PRN. She will continue fluconazole otherwise for esophageal candidiasis.  Otherwise, will await consultation with  [facility] as she has been refractory to [employer] over time. We will see if they can provide RFA/Halo which has been shown to be of benefit in refractory GAVE. Otherwise, repeat CBC in 2 weeks.    [place] Cellar, MD [facility] Pager [phone]

## 2015-06-28 NOTE — Telephone Encounter (Signed)
Saw patient in clinic, see note for details

## 2015-06-28 NOTE — Telephone Encounter (Signed)
Patient called to report pain in middle of stomach. Feels like a "door knob turning back and forth."  Cramping like pain. She took Oxycodone and slept for a few hours. No bleeding or vomiting. Stool is normal. Spoke with Dr. Havery Moros and patient should have clear liquid diet. May take Oxycodone for pain. MD will call patient later today. Patient aware.

## 2015-06-29 DIAGNOSIS — R52 Pain, unspecified: Secondary | ICD-10-CM | POA: Diagnosis not present

## 2015-07-05 DIAGNOSIS — J449 Chronic obstructive pulmonary disease, unspecified: Secondary | ICD-10-CM | POA: Diagnosis not present

## 2015-07-06 ENCOUNTER — Encounter: Payer: Self-pay | Admitting: Family Medicine

## 2015-07-06 DIAGNOSIS — K31819 Angiodysplasia of stomach and duodenum without bleeding: Secondary | ICD-10-CM | POA: Diagnosis not present

## 2015-07-06 DIAGNOSIS — K7460 Unspecified cirrhosis of liver: Secondary | ICD-10-CM | POA: Diagnosis not present

## 2015-07-06 DIAGNOSIS — Z515 Encounter for palliative care: Secondary | ICD-10-CM | POA: Insufficient documentation

## 2015-07-07 ENCOUNTER — Encounter (HOSPITAL_COMMUNITY)
Admission: RE | Admit: 2015-07-07 | Discharge: 2015-07-07 | Disposition: A | Discharge status: Home / Self Care | Payer: Medicare Other | Source: Ambulatory Visit | Attending: Pulmonary Disease | Admitting: Pulmonary Disease

## 2015-07-07 DIAGNOSIS — Q2733 Arteriovenous malformation of digestive system vessel: Secondary | ICD-10-CM | POA: Diagnosis not present

## 2015-07-07 DIAGNOSIS — N289 Disorder of kidney and ureter, unspecified: Secondary | ICD-10-CM | POA: Insufficient documentation

## 2015-07-07 DIAGNOSIS — K7460 Unspecified cirrhosis of liver: Secondary | ICD-10-CM | POA: Insufficient documentation

## 2015-07-07 DIAGNOSIS — D649 Anemia, unspecified: Secondary | ICD-10-CM | POA: Insufficient documentation

## 2015-07-07 LAB — POCT HEMOGLOBIN-HEMACUE: HEMOGLOBIN: 9.5 g/dL — AB (ref 12.0–15.0)

## 2015-07-07 MED ORDER — DARBEPOETIN ALFA 200 MCG/0.4ML IJ SOSY
PREFILLED_SYRINGE | INTRAMUSCULAR | Status: AC
  Filled 2015-07-07: qty 0.4

## 2015-07-07 MED ORDER — DARBEPOETIN ALFA 200 MCG/0.4ML IJ SOSY
200.0000 ug | PREFILLED_SYRINGE | INTRAMUSCULAR | Status: DC
  Administered 2015-07-07: 200 ug via SUBCUTANEOUS

## 2015-07-08 ENCOUNTER — Encounter: Payer: Self-pay | Admitting: Family Medicine

## 2015-07-09 DIAGNOSIS — J449 Chronic obstructive pulmonary disease, unspecified: Secondary | ICD-10-CM | POA: Diagnosis not present

## 2015-07-10 ENCOUNTER — Encounter: Payer: Self-pay | Admitting: *Deleted

## 2015-07-13 ENCOUNTER — Ambulatory Visit: Payer: Medicare Other | Admitting: Family Medicine

## 2015-07-13 ENCOUNTER — Other Ambulatory Visit: Payer: Self-pay | Admitting: *Deleted

## 2015-07-13 ENCOUNTER — Other Ambulatory Visit (INDEPENDENT_AMBULATORY_CARE_PROVIDER_SITE_OTHER): Payer: Medicare Other

## 2015-07-13 DIAGNOSIS — D500 Iron deficiency anemia secondary to blood loss (chronic): Secondary | ICD-10-CM | POA: Diagnosis not present

## 2015-07-13 LAB — CBC WITH DIFFERENTIAL/PLATELET
BASOS PCT: 0.4 % (ref 0.0–3.0)
Basophils Absolute: 0 10*3/uL (ref 0.0–0.1)
EOS ABS: 0.1 10*3/uL (ref 0.0–0.7)
Eosinophils Relative: 3.9 % (ref 0.0–5.0)
HEMATOCRIT: 28.9 % — AB (ref 36.0–46.0)
HEMOGLOBIN: 9.5 g/dL — AB (ref 12.0–15.0)
Lymphocytes Relative: 7.8 % — ABNORMAL LOW (ref 12.0–46.0)
Lymphs Abs: 0.3 10*3/uL — ABNORMAL LOW (ref 0.7–4.0)
MCHC: 33 g/dL (ref 30.0–36.0)
MCV: 99.1 fl (ref 78.0–100.0)
MONOS PCT: 7.7 % (ref 3.0–12.0)
Monocytes Absolute: 0.3 10*3/uL (ref 0.1–1.0)
NEUTROS ABS: 2.8 10*3/uL (ref 1.4–7.7)
Neutrophils Relative %: 80.2 % — ABNORMAL HIGH (ref 43.0–77.0)
Platelets: 78 10*3/uL — ABNORMAL LOW (ref 150.0–400.0)
RBC: 2.91 Mil/uL — AB (ref 3.87–5.11)
RDW: 19 % — ABNORMAL HIGH (ref 11.5–15.5)
WBC: 3.6 10*3/uL — ABNORMAL LOW (ref 4.0–10.5)

## 2015-07-13 NOTE — Patient Outreach (Addendum)
S:  Pt is not feeling well today, in fact, she says she hasn't felt right in a couple of days. She says she has trouble concentrating and remembering. She has not been taking her lactulose at all.   Pt had a fall at the doctor's office last week and bruised herself up. She has been very sore. She reports she is not able to move around very well on some days.  She monitors her temperature and it has gone up to 99.8, but usually it does go up to 99 most days.   Pt has finished the course of diflucan but she is still hoarse and feels like she still has the yeast infection.  O:  I observed pt trying to get her meds out and take them. She seemed to have great difficulty doing this. She has been resistant to a medication box up to this point because she has been comfortable with taking them from the bottles.  BP 118/82 mmHg  Pulse 58  Temp(Src) 98.3 F (36.8 C)  Resp 18  Wt 227 lb (102.967 kg)  SpO2 98%   RRR, lungs are clear, multiple bruises on arms and legs. Some skin lesions on her arms and legs.  A: NASH     General decline mentally and physically  P:  Instructed to begin taking the lactulose 30cc bid if tolerated but at least one dose per day.      Provided a medication box for her to fill weekly, Husband to assist if necessary.      Drew CBC with diff. For Dr. Havery Moros.  Deloria Lair [facility] [phone]

## 2015-07-14 ENCOUNTER — Ambulatory Visit (INDEPENDENT_AMBULATORY_CARE_PROVIDER_SITE_OTHER): Payer: Medicare Other | Admitting: Endocrinology

## 2015-07-14 ENCOUNTER — Encounter: Payer: Self-pay | Admitting: Endocrinology

## 2015-07-14 ENCOUNTER — Other Ambulatory Visit: Payer: Self-pay | Admitting: Family Medicine

## 2015-07-14 ENCOUNTER — Other Ambulatory Visit: Payer: Self-pay | Admitting: *Deleted

## 2015-07-14 ENCOUNTER — Other Ambulatory Visit: Payer: Self-pay | Admitting: Gastroenterology

## 2015-07-14 VITALS — BP 117/62 | HR 61 | Temp 98.3°F | Ht 62.5 in | Wt 226.0 lb

## 2015-07-14 DIAGNOSIS — E118 Type 2 diabetes mellitus with unspecified complications: Secondary | ICD-10-CM | POA: Diagnosis not present

## 2015-07-14 DIAGNOSIS — Z794 Long term (current) use of insulin: Secondary | ICD-10-CM

## 2015-07-14 DIAGNOSIS — IMO0002 Reserved for concepts with insufficient information to code with codable children: Secondary | ICD-10-CM

## 2015-07-14 DIAGNOSIS — E1165 Type 2 diabetes mellitus with hyperglycemia: Secondary | ICD-10-CM

## 2015-07-14 DIAGNOSIS — D509 Iron deficiency anemia, unspecified: Secondary | ICD-10-CM

## 2015-07-14 LAB — POCT GLYCOSYLATED HEMOGLOBIN (HGB A1C): Hemoglobin A1C: 5.6

## 2015-07-14 MED ORDER — SUCRALFATE 1 G PO TABS: ORAL_TABLET | ORAL | Status: DC

## 2015-07-14 NOTE — Patient Instructions (Addendum)
blood tests are requested for you today.  We'll let you know about the results. Please keep the scrape on your right leg covered with antibiotic ointment and a bandaid, until it heals. check your blood sugar twice a day.  vary the time of day when you check, between before the 3 meals, and at bedtime.  also check if you have symptoms of your blood sugar being too high or too low.  please keep a record of the readings and bring it to your next appointment here.  You can write it on any piece of paper.  please call us sooner if your blood sugar goes below 70, or if you have a lot of readings over 200. Please come back for a follow-up appointment in 3 months.

## 2015-07-14 NOTE — Telephone Encounter (Signed)
Yes that's fine, if it makes her feel better that's fine.

## 2015-07-14 NOTE — Telephone Encounter (Signed)
Is it ok to refill carafate?

## 2015-07-14 NOTE — Addendum Note (Signed)
Addended by: Damian Leavell S on: 07/14/2015 04:02 PM   Modules accepted: Orders

## 2015-07-14 NOTE — Progress Notes (Signed)
Subjective:    Patient ID: [name], female    DOB: 1941/03/07, 74 y.o.   MRN: [id number]  HPI Pt returns for f/u of diabetes mellitus: DM type: Insulin-requiring type 2 Dx'ed: [id number] Complications: polyneuropathy, foot ulcer, renal insufficiency, and CAD Therapy: insulin since 2010 GDM: never. DKA: never. Severe hypoglycemia: never.  Pancreatitis: never.  Other: she needs a simple insulin regimen, due to low functional state, but she takes her own insulin.  Interval history: no cbg record, but states cbg's vary from 200-500.  There is no trend throughout the day.  pt states she feels well in general.  Past Medical History  Diagnosis Date  . Chronic airway obstruction, not elsewhere classified   . Unspecified chronic bronchitis ([place])   . Unspecified essential hypertension   . Non-obstructive CAD   . Palpitations   . Mild aortic stenosis     a. 03/2014 Valve area (VTI): 2.89 cm^2, Valve area (Vmax): 2.7 cm^2.  . Pure hypercholesterolemia   . Morbid obesity ([place])   . Esophageal reflux   . GAVE (gastric antral vascular ectasia)     angiodysplasia  . Diverticulosis of colon (without mention of hemorrhage)   . Benign neoplasm of colon   . Osteoarthrosis, unspecified whether generalized or localized, unspecified site   . Osteoporosis, unspecified   . Restless legs syndrome (RLS)   . Major depressive disorder, recurrent episode, severe, specified as with psychotic behavior   . Iron deficiency anemia secondary to blood loss (chronic)     a. frequent PRBC transfusions.  . Coarse tremors     a. arms.  . Carpal tunnel syndrome on both sides   . Chronic diastolic CHF (congestive heart failure) ([place])     a. 03/26/2014 Echo: EF 55-60%, no rwma, mild AS/AI, mod-sev Ca2+ MV annulus, mildly to mod dil LA.  [name] Asthma   . Type II diabetes mellitus ([place])   . H/O hiatal hernia   . 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 dx'd 1990's  . Adenomatous colon  polyp   . Midsternal chest pain     a. conservatively managed ->poor candidate for cath/anticoagulation.  [name] Anxiety   . Portal hypertensive gastropathy   . Falls frequently     completed HHPT/OT 06/2014, PT unmet goals  . Recurrent UTI (urinary tract infection)     h/o hospitalization with urosepsis 2015, but thought large component colonization/bacteriuria, only treat if symptomatic ([place])  . Hiatal hernia   . Thrombocytopenia ([place])   . Protein calorie malnutrition ([place])   . History of pneumonia     CAP 2015  . On home oxygen therapy     "3L; 24/7" (01/25/2015)  . GAVE (gastric antral vascular ectasia)     s/p multiple APCs, discussing RFA with [facility] GI Dr Newman Pies (06/2015)  . OSA (obstructive sleep apnea)     a. does not use CPAP. (01/25/2015)  . Hepatitis     Past Surgical History  Procedure Laterality Date  . Hemorroidectomy    . Appendectomy    . Vaginal hysterectomy    . Breast biopsy      bilateral  . Cesarean section      x 3  . Appendectomy    . Esophagogastroduodenoscopy  06/12/2012    Procedure: ESOPHAGOGASTRODUODENOSCOPY (EGD);  Surgeon: Lafayette Dragon, MD;  Location: [facility];  Service: Endoscopy;  Laterality: N/A;  . Balloon dilation  06/12/2012    Procedure: BALLOON DILATION;  Surgeon:  Lafayette Dragon, MD;  Location: [facility];  Service: Endoscopy;  Laterality: N/A;  ?balloon  . Esophagogastroduodenoscopy  09/10/2012    Procedure: ESOPHAGOGASTRODUODENOSCOPY (EGD);  Surgeon: Lafayette Dragon, MD;  Location: [facility];  Service: Endoscopy;  Laterality: N/A;  . Hot hemostasis  09/10/2012    Procedure: HOT HEMOSTASIS (ARGON PLASMA COAGULATION/BICAP);  Surgeon: Lafayette Dragon, MD;  Location: [facility];  Service: Endoscopy;  Laterality: N/A;  . Esophagogastroduodenoscopy N/A 01/18/2013    Procedure: ESOPHAGOGASTRODUODENOSCOPY (EGD);  Surgeon: Lafayette Dragon, MD;  Location: [facility] END[facility]COPY;  Service: Endoscopy;  Laterality: N/A;  . Esophagogastroduodenoscopy N/A  05/24/2013    Procedure: ESOPHAGOGASTRODUODENOSCOPY (EGD);  Surgeon: Jerene Bears, MD;  Location: [place];  Service: Gastroenterology;  Laterality: N/A;  . Hot hemostasis N/A 05/24/2013    Procedure: HOT HEMOSTASIS (ARGON PLASMA COAGULATION/BICAP);  Surgeon: Jerene Bears, MD;  Location: [facility];  Service: Gastroenterology;  Laterality: N/A;  . US echocardiography  07/2014    mild LVH, EF 60-65%, normal wall motion, mild AR, mod dilated LA, mildly dilated RA, peak PA pressure 63mmHg  . Esophagogastroduodenoscopy N/A 10/20/2014    Procedure: ESOPHAGOGASTRODUODENOSCOPY (EGD);  Surgeon: Lafayette Dragon, MD;  Location: [facility];  Service: Endoscopy;  Laterality: N/A;  . Hot hemostasis N/A 10/20/2014    Procedure: HOT HEMOSTASIS (ARGON PLASMA COAGULATION/BICAP);  Surgeon: Lafayette Dragon, MD;  Location: [facility];  Service: Endoscopy;  Laterality: N/A;  . Enteroscopy N/A 02/25/2015    Procedure: ENTEROSCOPY;  Surgeon: [name] Ada, MD;  Location: [place];  Service: Endoscopy;  Laterality: N/A;  . Enteroscopy N/A 06/27/2015    Procedure: ENTEROSCOPY;  Surgeon: Manus Gunning, MD;  Location: [facility];  Service: Gastroenterology;  Laterality: N/A;    Social History   Social History  . Marital Status: Married    Spouse Name: jerry Sliwinski  . Number of Children: 4  . Years of Education: 12   Occupational History  . Disabled   .     Social History Main Topics  . Smoking status: Former Smoker -- 1.50 packs/day for 25 years    Types: Cigarettes    Quit date: 08/26/1992  . Smokeless tobacco: Never Used  . Alcohol Use: No     Comment: [id number] "last alcoholic drink was years ago"  . Drug Use: No  . Sexual Activity: No   Other Topics Concern  . Not on file   Social History Narrative   Patient lives at home with her husband [name]).    Retired.   Caffeine- None    Right handed.      Dr. Olevia Perches GI    Dr. Burt Knack cards    Dr. Loanne Drilling endo   Dr Lenna Gilford pulm   Psych:  Followed by [facility] (ph [phone], fax [phone]).     Current Outpatient Prescriptions on File Prior to Visit  Medication Sig Dispense Refill  . ADVAIR DISKUS 250-50 MCG/DOSE AEPB INHALE 1 PUFF INTO LUNGS TWICE A DAY 60 each 11  . albuterol (PROAIR HFA) 108 (90 BASE) MCG/ACT inhaler Inhale 2 puffs into the lungs every 6 (six) hours as needed for wheezing or shortness of breath.     . ALPRAZolam (XANAX) 1 MG tablet Take 1 tablet (1 mg total) by mouth every 8 (eight) hours as needed. for anxiety 30 tablet 0  . B Complex-C ([employer] B COMPLEX PLUS C) TABS TAKE ONE CAPSULE BY MOUTH EVERY DAY 30 tablet 5  . buPROPion (WELLBUTRIN XL) 150 MG 24 hr  tablet Take 1 tablet (150 mg total) by mouth daily. 90 tablet 3  . clotrimazole (LOTRIMIN) 1 % cream Apply topically 2 (two) times daily as needed. (Patient taking differently: Apply 1 application topically 2 (two) times daily as needed (fungal infection). ) 30 g 1  . darbepoetin (ARANESP) 200 MCG/0.4ML SOLN injection Inject 200 mcg into the skin every 14 (fourteen) days.    [name] desvenlafaxine (PRISTIQ) 50 MG 24 hr tablet Take 3 tablets (150 mg total) by mouth daily. 90 tablet 11  . ferrous sulfate 325 (65 FE) MG tablet Take 1 tablet (325 mg total) by mouth daily with breakfast. 90 tablet 1  . fluconazole (DIFLUCAN) 200 MG tablet 2 tabs po x 1 on day 1, then 1 tab daily for 13 days 15 tablet 0  . furosemide (LASIX) 40 MG tablet Increase Lasix to 80 mg BID x 3 days then resume current dose. (Patient taking differently: Take 40 mg by mouth 2 (two) times daily. ) 30 tablet   . guaiFENesin (MUCINEX) 600 MG 12 hr tablet Take 600 mg by mouth 2 (two) times daily.     . Insulin Glargine (LANTUS SOLOSTAR) 100 UNIT/ML Solostar Pen Inject 120 Units into the skin every morning. 20 pen 11  . insulin lispro (HUMALOG KWIKPEN) 100 UNIT/ML KiwkPen Inject 10 Units into the skin daily with supper.    . lactulose, encephalopathy, (CHRONULAC) 10 GM/15ML SOLN Take 30 mLs (20 g  total) by mouth 2 (two) times daily. constipation 2700 mL 6  . levalbuterol (XOPENEX) 0.63 MG/3ML nebulizer solution Take 0.63 mg by nebulization every 8 (eight) hours as needed for wheezing or shortness of breath.   5  . Multiple Vitamins-Minerals (CENTRUM SILVER PO) Take 1 tablet by mouth daily.     . nadolol (CORGARD) 20 MG tablet Take as directed- (take 1/4 tablet (5 mg) daily due to bradycardia)-per cardiology 30 tablet 1  . oxyCODONE (OXY IR/ROXICODONE) 5 MG immediate release tablet Take 1 tablet (5 mg total) by mouth every 6 (six) hours as needed for severe pain. 30 tablet 0  . pantoprazole (PROTONIX) 40 MG tablet Take 1 tablet (40 mg total) by mouth 2 (two) times daily. 60 tablet 6  . promethazine (PHENERGAN) 25 MG tablet Take 1 tablet (25 mg total) by mouth 2 (two) times daily as needed for nausea. 30 tablet 3  . QUEtiapine (SEROQUEL) 200 MG tablet Take 1 tablet (200 mg total) by mouth at bedtime. 30 tablet 6  . rOPINIRole (REQUIP) 4 MG tablet TAKE 1 TABLET EVERY DAY 30 tablet 6  . Saline (OCEAN NASAL SPRAY NA) Place 1 spray into the nose 3 (three) times daily as needed (congestion).     . simvastatin (ZOCOR) 40 MG tablet TAKE 1 TABLET EVERY DAY 30 tablet 11  . spironolactone (ALDACTONE) 25 MG tablet TAKE 0.5 TABLETS (12.5 MG TOTAL) BY MOUTH DAILY. 15 tablet 0  . Vitamin D, Ergocalciferol, (DRISDOL) 50000 UNITS CAPS capsule TAKE ONE CAPSULE WEEKLY (Patient taking differently: TAKE ONE CAPSULE WEEKLY. Friday) 4 capsule 6  . XIFAXAN 550 MG TABS tablet TAKE 1 TABLET TWICE A DAY 60 tablet 11   No current facility-administered medications on file prior to visit.    Allergies  Allergen Reactions  . Acetaminophen Other (See Comments)    Liver dysfunction  . Aspirin Other (See Comments)    Causes nosebleeds  . Theophylline Nausea And Vomiting  . Nsaids     Liver dysfunction   . Penicillins Itching  Has patient had a PCN reaction causing immediate rash, facial/tongue/throat swelling,  SOB or lightheadedness with hypotension: unknown Has patient had a PCN reaction causing severe rash involving mucus membranes or skin necrosis: unknown Has patient had a PCN reaction that required hospitalization unknown Has patient had a PCN reaction occurring within the last 10 years: yes If all of the above answers are "NO", then may proceed with Cephalosporin use.     Family History  Problem Relation Age of Onset  . Heart disease Mother   . Cervical cancer Mother   . Kidney disease Mother   . Diabetes Mother   . Breast cancer Sister   . Multiple sclerosis Sister   . Colon cancer Neg Hx   . Breast cancer Maternal Aunt     x 2  . Diabetes Father   . Diabetes Sister   . Multiple sclerosis Other   . Heart attack Mother   . Heart attack Father   . Stroke Daughter   . Stroke Daughter     BP 117/62 mmHg  Pulse 61  Temp(Src) 98.3 F (36.8 C) (Oral)  Ht 5' 2.5" (1.588 m)  Wt 226 lb (102.513 kg)  BMI 40.65 kg/m2  SpO2 94%  Review of Systems She denies hypoglycemia.      Objective:   Physical Exam VITAL SIGNS: See vs page GENERAL: no distress.  In wheelchair.  Has 02 on Pulses: dorsalis pedis intact bilat.  MSK: no deformity of the feet CV: trace right leg edema (1+ on the left) Skin: no ulcer on the feet, but there is a slight abrasion on the right leg . normal color and temp on the feet. Neuro: sensation is intact to touch on the feet, but decreased from normal. Ext: There is bilateral onychomycosis of the toenails  Lab Results  Component Value Date   CREATININE 1.15* 05/08/2015   BUN 27* 05/08/2015   NA 139 05/08/2015   K 3.9 05/08/2015   CL 98* 05/08/2015   CO2 32 05/08/2015   A1c=5.6%  I have reviewed outside records, and summarized: Pt was noted to have had a recent transfusion of 2 units PRBC.     Assessment & Plan:  DM: possibly overcontrolled.   Blood transfusion, new: this might affect a1c, so we'll check fructosamine.    Patient is  advised the following: Patient Instructions  blood tests are requested for you today.  We'll let you know about the results. Please keep the scrape on your right leg covered with antibiotic ointment and a bandaid, until it heals. check your blood sugar twice a day.  vary the time of day when you check, between before the 3 meals, and at bedtime.  also check if you have symptoms of your blood sugar being too high or too low.  please keep a record of the readings and bring it to your next appointment here.  You can write it on any piece of paper.  please call us sooner if your blood sugar goes below 70, or if you have a lot of readings over 200. Please come back for a follow-up appointment in 3 months.

## 2015-07-17 ENCOUNTER — Ambulatory Visit (INDEPENDENT_AMBULATORY_CARE_PROVIDER_SITE_OTHER): Payer: Medicare Other | Admitting: Family Medicine

## 2015-07-17 ENCOUNTER — Encounter: Payer: Self-pay | Admitting: Family Medicine

## 2015-07-17 VITALS — BP 114/82 | HR 60 | Temp 98.4°F | Wt 224.8 lb

## 2015-07-17 DIAGNOSIS — D61818 Other pancytopenia: Secondary | ICD-10-CM

## 2015-07-17 DIAGNOSIS — I5032 Chronic diastolic (congestive) heart failure: Secondary | ICD-10-CM

## 2015-07-17 DIAGNOSIS — N3080 Other cystitis without hematuria: Secondary | ICD-10-CM | POA: Diagnosis not present

## 2015-07-17 DIAGNOSIS — IMO0002 Reserved for concepts with insufficient information to code with codable children: Secondary | ICD-10-CM

## 2015-07-17 DIAGNOSIS — Z794 Long term (current) use of insulin: Secondary | ICD-10-CM | POA: Diagnosis not present

## 2015-07-17 DIAGNOSIS — E1165 Type 2 diabetes mellitus with hyperglycemia: Secondary | ICD-10-CM

## 2015-07-17 DIAGNOSIS — N3090 Cystitis, unspecified without hematuria: Secondary | ICD-10-CM

## 2015-07-17 DIAGNOSIS — K7469 Other cirrhosis of liver: Secondary | ICD-10-CM

## 2015-07-17 DIAGNOSIS — E118 Type 2 diabetes mellitus with unspecified complications: Secondary | ICD-10-CM

## 2015-07-17 DIAGNOSIS — K8689 Other specified diseases of pancreas: Secondary | ICD-10-CM

## 2015-07-17 DIAGNOSIS — F333 Major depressive disorder, recurrent, severe with psychotic symptoms: Secondary | ICD-10-CM

## 2015-07-17 DIAGNOSIS — K31819 Angiodysplasia of stomach and duodenum without bleeding: Secondary | ICD-10-CM | POA: Diagnosis not present

## 2015-07-17 LAB — URINALYSIS, ROUTINE W REFLEX MICROSCOPIC
Bilirubin Urine: NEGATIVE
Ketones, ur: NEGATIVE
Nitrite: NEGATIVE
PH: 6 (ref 5.0–8.0)
Specific Gravity, Urine: 1.015 (ref 1.000–1.030)
UROBILINOGEN UA: 0.2 (ref 0.0–1.0)
Urine Glucose: NEGATIVE

## 2015-07-17 LAB — FRUCTOSAMINE: Fructosamine: 307 umol/L — ABNORMAL HIGH (ref 190–270)

## 2015-07-17 LAB — MICROALBUMIN / CREATININE URINE RATIO
CREATININE, U: 95.5 mg/dL
MICROALB UR: 9.4 mg/dL — AB (ref 0.0–1.9)
MICROALB/CREAT RATIO: 9.8 mg/g (ref 0.0–30.0)

## 2015-07-17 NOTE — Assessment & Plan Note (Signed)
Appreciate GI care. Discussing RFA with GI.

## 2015-07-17 NOTE — Assessment & Plan Note (Addendum)
Seems euvolemic today. No changes to current regimen. Will review nadolol change request when they bring it in.

## 2015-07-17 NOTE — Progress Notes (Signed)
Pre visit review using our clinic review tool, if applicable. No additional management support is needed unless otherwise documented below in the visit note. 

## 2015-07-17 NOTE — Assessment & Plan Note (Signed)
This is also pending evaluation by Dr Amie Critchley GI.

## 2015-07-17 NOTE — Progress Notes (Signed)
BP 114/82 mmHg  Pulse 60  Temp(Src) 98.4 F (36.9 C) (Oral)  Wt 224 lb 12 oz (101.946 kg)   CC: 3 mo f/u visit  Subjective:    Patient ID: [name], female    DOB: [birth year], 74 y.o.   MRN: [id number]  HPI: [name] is a 74 y.o. female presenting on 07/17/2015 for Follow-up   Saw endo last week - concern for overtreatment so fructosamine was ordered. Pending results.   Closely followed by GI Dr Havery Moros s/p blood transfusions for her GAVE. S/p multiple APC treatments, recent referral to Dr Newman Pies at [facility] discussing possible RFA. Also discussing further evaluation of dilated pancreatic duct.   She has been running PM temperatures to 99.5. Worried about thinks. Denies urgency, dysuria, hematuria, frequency. No trouble voiding. Thinks she has darker urine recently.   Hospice now involved.  Deloria Lair NP continues to see patient as well. She has started using her pill box.   Received letter from insurance about need to change nadolol with alternatives listed. I've asked him to bring me copy of letter.  Relevant past medical, surgical, family and social history reviewed and updated as indicated. Interim medical history since our last visit reviewed. Allergies and medications reviewed and updated. Current Outpatient Prescriptions on File Prior to Visit  Medication Sig  . ADVAIR DISKUS 250-50 MCG/DOSE AEPB INHALE 1 PUFF INTO LUNGS TWICE A DAY  . albuterol (PROAIR HFA) 108 (90 BASE) MCG/ACT inhaler Inhale 2 puffs into the lungs every 6 (six) hours as needed for wheezing or shortness of breath.   . ALPRAZolam (XANAX) 1 MG tablet Take 1 tablet (1 mg total) by mouth every 8 (eight) hours as needed. for anxiety  . B Complex-C ([employer]S B COMPLEX PLUS C) TABS TAKE ONE CAPSULE BY MOUTH EVERY DAY  . buPROPion (WELLBUTRIN XL) 150 MG 24 hr tablet Take 1 tablet (150 mg total) by mouth daily.  . clotrimazole (LOTRIMIN) 1 % cream Apply topically 2 (two) times daily as  needed. (Patient taking differently: Apply 1 application topically 2 (two) times daily as needed (fungal infection). )  . darbepoetin (ARANESP) 200 MCG/0.4ML SOLN injection Inject 200 mcg into the skin every 14 (fourteen) days.  [name] desvenlafaxine (PRISTIQ) 50 MG 24 hr tablet Take 3 tablets (150 mg total) by mouth daily.  . ferrous sulfate 325 (65 FE) MG tablet Take 1 tablet (325 mg total) by mouth daily with breakfast.  . furosemide (LASIX) 40 MG tablet Increase Lasix to 80 mg BID x 3 days then resume current dose. (Patient taking differently: Take 40 mg by mouth 2 (two) times daily. )  . guaiFENesin (MUCINEX) 600 MG 12 hr tablet Take 600 mg by mouth 2 (two) times daily.   . Insulin Glargine (LANTUS SOLOSTAR) 100 UNIT/ML Solostar Pen Inject 120 Units into the skin every morning.  . insulin lispro (HUMALOG KWIKPEN) 100 UNIT/ML KiwkPen Inject 10 Units into the skin daily with supper.  . lactulose, encephalopathy, (CHRONULAC) 10 GM/15ML SOLN Take 30 mLs (20 g total) by mouth 2 (two) times daily. constipation  . levalbuterol (XOPENEX) 0.63 MG/3ML nebulizer solution Take 0.63 mg by nebulization every 8 (eight) hours as needed for wheezing or shortness of breath.   . loratadine (CLARITIN) 10 MG tablet TAKE 1 TABLET BY MOUTH EVERY DAY  . Multiple Vitamins-Minerals (CENTRUM SILVER PO) Take 1 tablet by mouth daily.   . nadolol (CORGARD) 20 MG tablet Take as directed- (take 1/4 tablet (5 mg)  daily due to bradycardia)-per cardiology  . oxyCODONE (OXY IR/ROXICODONE) 5 MG immediate release tablet Take 1 tablet (5 mg total) by mouth every 6 (six) hours as needed for severe pain.  . pantoprazole (PROTONIX) 40 MG tablet Take 1 tablet (40 mg total) by mouth 2 (two) times daily.  . promethazine (PHENERGAN) 25 MG tablet Take 1 tablet (25 mg total) by mouth 2 (two) times daily as needed for nausea.  [name] QUEtiapine (SEROQUEL) 200 MG tablet Take 1 tablet (200 mg total) by mouth at bedtime.  [name] rOPINIRole (REQUIP) 4 MG tablet  TAKE 1 TABLET EVERY DAY  . Saline (OCEAN NASAL SPRAY NA) Place 1 spray into the nose 3 (three) times daily as needed (congestion).   . simvastatin (ZOCOR) 40 MG tablet TAKE 1 TABLET EVERY DAY  . spironolactone (ALDACTONE) 25 MG tablet TAKE 0.5 TABLETS (12.5 MG TOTAL) BY MOUTH DAILY.  [name] sucralfate (CARAFATE) 1 G tablet TAKE 1 TABLET (1 G TOTAL) BY MOUTH EVERY 6 (SIX) HOURS PRN.  [name] Vitamin D, Ergocalciferol, (DRISDOL) 50000 UNITS CAPS capsule TAKE ONE CAPSULE WEEKLY (Patient taking differently: TAKE ONE CAPSULE WEEKLY. Friday)  . XIFAXAN 550 MG TABS tablet TAKE 1 TABLET TWICE A DAY   No current facility-administered medications on file prior to visit.    Review of Systems Per HPI unless specifically indicated in ROS section     Objective:    BP 114/82 mmHg  Pulse 60  Temp(Src) 98.4 F (36.9 C) (Oral)  Wt 224 lb 12 oz (101.946 kg)  Wt Readings from Last 3 Encounters:  07/17/15 224 lb 12 oz (101.946 kg)  07/14/15 226 lb (102.513 kg)  07/13/15 227 lb (102.967 kg)    Physical Exam  Constitutional: She appears well-developed and well-nourished. No distress.  Chronically ill, tired appearing in wheelchair. 3L [place] at baseline  Cardiovascular: Normal rate, regular rhythm and intact distal pulses.   Murmur (3/6 systolic murmur) heard. Pulmonary/Chest: Effort normal and breath sounds normal. No respiratory distress. She has no wheezes. She has no rales.  Abdominal: Soft. Bowel sounds are normal. She exhibits no mass. There is no tenderness. There is no guarding and no CVA tenderness.  Musculoskeletal: She exhibits no edema.  Skin: Skin is warm and dry. No rash noted.  Nursing note and vitals reviewed.  Lab Results  Component Value Date   HGBA1C 5.6 07/14/2015       Assessment & Plan:   Problem List Items Addressed This Visit    Severe recurrent major depressive disorder with psychotic features ([place])    Continue pristiq, xanax, and seroquel.      Recurrent cystitis    H/o  urosepsis 03/2014. With new sxs of change in urine color will check UA.       Other pancytopenia ([place])    ?autoimmune hepatitis related.      Hepatic cirrhosis ([facility])    Followed closely by GI.      GAVE (gastric antral vascular ectasia) - Primary    Appreciate GI care. Discussing RFA with GI.      Dilated pancreatic duct [facility])    This is also pending evaluation by Dr Amie Critchley GI.      Diabetes mellitus type 2 with complications, uncontrolled ([place])    Check microalb today. Pending fructosamine. Appreciate care by Dr Loanne Drilling.      Relevant Orders   Microalbumin / creatinine urine ratio   Chronic diastolic CHF (congestive heart failure) ([facility]) (Chronic)    Seems euvolemic today. No changes  to current regimen. Will review nadolol change request when they bring it in.          Follow up plan: Return in about 3 months (around 10/17/2015), or as needed, for follow up visit.

## 2015-07-17 NOTE — Assessment & Plan Note (Signed)
Followed closely by GI 

## 2015-07-17 NOTE — Patient Instructions (Addendum)
Let's check urinalysis and urine microalbumin today.  Nice to see you today, call us with questions.  Return as needed or in 3 months for follow up. Continue using pill box.  Bring me letter about nadolol to review.

## 2015-07-17 NOTE — Assessment & Plan Note (Signed)
?  autoimmune hepatitis related.

## 2015-07-17 NOTE — Assessment & Plan Note (Signed)
Check microalb today. Pending fructosamine. Appreciate care by Dr Loanne Drilling.

## 2015-07-17 NOTE — Assessment & Plan Note (Signed)
Continue pristiq, xanax, and seroquel.

## 2015-07-17 NOTE — Assessment & Plan Note (Addendum)
H/o urosepsis 03/2014. With new sxs of change in urine color will check UA.

## 2015-07-17 NOTE — Addendum Note (Signed)
Addended by: Ellamae Sia on: 07/17/2015 10:05 AM   Modules accepted: Orders

## 2015-07-18 ENCOUNTER — Other Ambulatory Visit: Payer: Self-pay | Admitting: Family Medicine

## 2015-07-18 ENCOUNTER — Other Ambulatory Visit: Payer: Medicare Other

## 2015-07-18 DIAGNOSIS — N3080 Other cystitis without hematuria: Secondary | ICD-10-CM

## 2015-07-18 MED ORDER — CIPROFLOXACIN HCL 250 MG PO TABS: 250.0000 mg | ORAL_TABLET | Freq: Two times a day (BID) | ORAL | Status: DC

## 2015-07-21 ENCOUNTER — Encounter (HOSPITAL_COMMUNITY): Payer: Medicare Other

## 2015-07-22 ENCOUNTER — Telehealth: Payer: Self-pay | Admitting: Family Medicine

## 2015-07-22 MED ORDER — CARVEDILOL 3.125 MG PO TABS: ORAL_TABLET | ORAL | Status: DC

## 2015-07-22 NOTE — Telephone Encounter (Signed)
That sounds fine to me, thanks for letting me know.

## 2015-07-22 NOTE — Telephone Encounter (Signed)
Received letter from insurance about increasing tier for nadolol starting 2017 (95$ copay).  B blocker predominantly used for variceal bleed prophylaxis in this patient with diastolic heart failure.  Kim plz notify pt we willl send in once daily carvedilol 3.125mg  1/2 tab for pt to price out/try instead of nadolol. If unaffordable, consider non selective propranolol.  Will CC cards and GI as fyi.

## 2015-07-24 NOTE — Telephone Encounter (Signed)
Patient notified and will let me know if carvedilol is too costly.

## 2015-07-25 ENCOUNTER — Other Ambulatory Visit: Payer: Self-pay | Admitting: *Deleted

## 2015-07-25 MED ORDER — SPIRONOLACTONE 25 MG PO TABS: ORAL_TABLET | ORAL | Status: DC

## 2015-07-25 NOTE — Telephone Encounter (Signed)
Ok to refill? Requests 90 day supply. 

## 2015-07-26 ENCOUNTER — Encounter (HOSPITAL_COMMUNITY)
Admission: RE | Admit: 2015-07-26 | Discharge: 2015-07-26 | Disposition: A | Discharge status: Home / Self Care | Payer: Medicare Other | Source: Ambulatory Visit | Attending: Family Medicine | Admitting: Family Medicine

## 2015-07-26 DIAGNOSIS — D649 Anemia, unspecified: Secondary | ICD-10-CM | POA: Diagnosis not present

## 2015-07-26 DIAGNOSIS — N289 Disorder of kidney and ureter, unspecified: Secondary | ICD-10-CM | POA: Diagnosis not present

## 2015-07-26 DIAGNOSIS — Q2733 Arteriovenous malformation of digestive system vessel: Secondary | ICD-10-CM | POA: Diagnosis not present

## 2015-07-26 DIAGNOSIS — K7460 Unspecified cirrhosis of liver: Secondary | ICD-10-CM | POA: Diagnosis not present

## 2015-07-26 MED ORDER — DARBEPOETIN ALFA 200 MCG/0.4ML IJ SOSY: 200.0000 ug | PREFILLED_SYRINGE | INTRAMUSCULAR | Status: DC

## 2015-07-26 MED ORDER — DARBEPOETIN ALFA 200 MCG/0.4ML IJ SOSY
PREFILLED_SYRINGE | INTRAMUSCULAR | Status: AC
  Administered 2015-07-26: 200 ug
  Filled 2015-07-26: qty 0.4

## 2015-07-27 ENCOUNTER — Telehealth: Payer: Self-pay | Admitting: *Deleted

## 2015-07-27 ENCOUNTER — Other Ambulatory Visit: Payer: Self-pay | Admitting: *Deleted

## 2015-07-27 ENCOUNTER — Other Ambulatory Visit: Payer: Self-pay | Admitting: Family Medicine

## 2015-07-27 LAB — POCT HEMOGLOBIN-HEMACUE: HEMOGLOBIN: 9.2 g/dL — AB (ref 12.0–15.0)

## 2015-07-27 NOTE — Patient Outreach (Signed)
[facility]) Care Management   07/27/2015  [name] 06-03-1941 ON:[id number]  [name] is an 74 y.o. female  Subjective: Pt doesn't feel well. Continues to complain about weakness, trouble walking, evening low grade fever. She has not started using her medication box. She has also not resumed taking the lactulose as I has instructed her to do so.   Pt had her aranesp injection yesterday. Her Hgb was 9.2.  Reports engaging in unsafe activity - pt holds husbands hands to walk anywhere in house and is not using her walker.  Objective:   Review of Systems  Constitutional: Positive for fever, malaise/fatigue and diaphoresis.       PM fever of 99.5 most evenings. Deconditioned having trouble walking. Husband leads her holding her hands to bathroom or bed. Not using her walker very much. No falls since about a month ago.  HENT: Negative.   Eyes: Negative.   Respiratory: Negative.   Cardiovascular: Positive for chest pain and leg swelling.       CP comes and goes. Worse when she is upset or with increased activity. 1+ edema.  Gastrointestinal: Negative.   Genitourinary: Negative.   Musculoskeletal: Positive for falls.  Skin: Positive for itching and rash.  Neurological: Positive for dizziness and weakness.  Endo/Heme/Allergies: Bruises/bleeds easily.       Skin condition is the best I have seen in a long time, skin lesions have cleared to a great extent with using mupriocin and covering them with bandages.  Psychiatric/Behavioral: Positive for depression. The patient is nervous/anxious.    BP 124/70 mmHg  Pulse 62  Resp 18  Wt 228 lb (103.42 kg)  SpO2 98% FBS: 250  Physical Exam  Constitutional: She appears well-developed and well-nourished.  HENT:  Head: Normocephalic and atraumatic.  Neck:  Neck stiffness and soreness.    Current Medications:   Current Outpatient Prescriptions  Medication Sig Dispense Refill  . ADVAIR DISKUS 250-50 MCG/DOSE AEPB  INHALE 1 PUFF INTO LUNGS TWICE A DAY 60 each 11  . albuterol (PROAIR HFA) 108 (90 BASE) MCG/ACT inhaler Inhale 2 puffs into the lungs every 6 (six) hours as needed for wheezing or shortness of breath.     . ALPRAZolam (XANAX) 1 MG tablet Take 1 tablet (1 mg total) by mouth every 8 (eight) hours as needed. for anxiety 30 tablet 0  . B Complex-C ([employer] B COMPLEX PLUS C) TABS TAKE ONE CAPSULE BY MOUTH EVERY DAY 30 tablet 5  . buPROPion (WELLBUTRIN XL) 150 MG 24 hr tablet Take 1 tablet (150 mg total) by mouth daily. 90 tablet 3  . carvedilol (COREG) 3.125 MG tablet Take as directed - 1/2 tablet daily 30 tablet 1  . ciprofloxacin (CIPRO) 250 MG tablet Take 1 tablet (250 mg total) by mouth 2 (two) times daily. 14 tablet 0  . clotrimazole (LOTRIMIN) 1 % cream Apply topically 2 (two) times daily as needed. (Patient taking differently: Apply 1 application topically 2 (two) times daily as needed (fungal infection). ) 30 g 1  . darbepoetin (ARANESP) 200 MCG/0.4ML SOLN injection Inject 200 mcg into the skin every 14 (fourteen) days.    [name] desvenlafaxine (PRISTIQ) 50 MG 24 hr tablet Take 3 tablets (150 mg total) by mouth daily. 90 tablet 11  . ferrous sulfate 325 (65 FE) MG tablet Take 1 tablet (325 mg total) by mouth daily with breakfast. 90 tablet 1  . furosemide (LASIX) 40 MG tablet Increase Lasix to 80 mg BID x  3 days then resume current dose. (Patient taking differently: Take 40 mg by mouth 2 (two) times daily. ) 30 tablet   . guaiFENesin (MUCINEX) 600 MG 12 hr tablet Take 600 mg by mouth 2 (two) times daily.     . Insulin Glargine (LANTUS SOLOSTAR) 100 UNIT/ML Solostar Pen Inject 120 Units into the skin every morning. 20 pen 11  . insulin lispro (HUMALOG KWIKPEN) 100 UNIT/ML KiwkPen Inject 10 Units into the skin daily with supper.    . lactulose, encephalopathy, (CHRONULAC) 10 GM/15ML SOLN Take 30 mLs (20 g total) by mouth 2 (two) times daily. constipation 2700 mL 6  . levalbuterol (XOPENEX) 0.63 MG/3ML  nebulizer solution Take 0.63 mg by nebulization every 8 (eight) hours as needed for wheezing or shortness of breath.   5  . loratadine (CLARITIN) 10 MG tablet TAKE 1 TABLET BY MOUTH EVERY DAY 30 tablet 11  . Multiple Vitamins-Minerals (CENTRUM SILVER PO) Take 1 tablet by mouth daily.     . nadolol (CORGARD) 20 MG tablet Take as directed- (take 1/4 tablet (5 mg) daily due to bradycardia)-per cardiology 30 tablet 1  . oxyCODONE (OXY IR/ROXICODONE) 5 MG immediate release tablet Take 1 tablet (5 mg total) by mouth every 6 (six) hours as needed for severe pain. 30 tablet 0  . pantoprazole (PROTONIX) 40 MG tablet Take 1 tablet (40 mg total) by mouth 2 (two) times daily. 60 tablet 6  . promethazine (PHENERGAN) 25 MG tablet Take 1 tablet (25 mg total) by mouth 2 (two) times daily as needed for nausea. 30 tablet 3  . QUEtiapine (SEROQUEL) 200 MG tablet Take 1 tablet (200 mg total) by mouth at bedtime. 30 tablet 6  . rOPINIRole (REQUIP) 4 MG tablet TAKE 1 TABLET EVERY DAY 30 tablet 6  . Saline (OCEAN NASAL SPRAY NA) Place 1 spray into the nose 3 (three) times daily as needed (congestion).     . simvastatin (ZOCOR) 40 MG tablet TAKE 1 TABLET EVERY DAY 30 tablet 11  . spironolactone (ALDACTONE) 25 MG tablet TAKE 0.5 TABLETS (12.5 MG TOTAL) BY MOUTH DAILY. 15 tablet 0  . sucralfate (CARAFATE) 1 G tablet TAKE 1 TABLET (1 G TOTAL) BY MOUTH EVERY 6 (SIX) HOURS PRN. 28 tablet 0  . Vitamin D, Ergocalciferol, (DRISDOL) 50000 UNITS CAPS capsule TAKE ONE CAPSULE WEEKLY (Patient taking differently: TAKE ONE CAPSULE WEEKLY. Friday) 4 capsule 6  . XIFAXAN 550 MG TABS tablet TAKE 1 TABLET TWICE A DAY 60 tablet 11   No current facility-administered medications for this visit.    Functional Status:   In your present state of health, do you have any difficulty performing the following activities: 07/26/2015 07/07/2015  Hearing? N N  Vision? N N  Difficulty concentrating or making decisions? N N  Walking or climbing  stairs? Y Y  Dressing or bathing? N N  Doing errands, shopping? - -  Preparing Food and eating ? - -    Fall/Depression Screening:    PHQ 2/9 Scores 04/03/2015 01/05/2015 12/19/2014 12/19/2014  PHQ - 2 Score 1 2 2 2   PHQ- 9 Score - - - 10   Fall Risk  07/28/2015 04/03/2015 01/05/2015 12/19/2014 12/05/2014  Falls in the past year? Yes Yes Yes Yes Yes  Number falls in past yr: 2 or more 2 or more 2 or more 2 or more 2 or more  Injury with Fall? No Yes Yes Yes Yes  Risk Factor Category  High Fall Risk High Fall Risk High Fall  Risk High Fall Risk -  Risk for fall due to : History of fall(s);Impaired balance/gait;Impaired mobility;Medication side effect;Mental status change History of fall(s);Impaired balance/gait;Impaired mobility;Medication side effect History of fall(s);Impaired balance/gait Impaired balance/gait;Impaired mobility;History of fall(s) History of fall(s)  Risk for fall due to (comments): - Pt is getting PT/OT and this is helping with strengthening and balance. Pt exercising on other days also. - - -  Follow up Falls evaluation completed;Education provided;Falls prevention discussed Falls evaluation completed;Education provided;Falls prevention discussed Falls evaluation completed;Education provided;Falls prevention discussed - -     Assessment:  NASH                         HF                         DM                         Medication compliance issues.   Deconditioned and safety issues, high risk for falls.  Plan: Defered today's ordered CBC with diff for another 2 weeks.           Advised pt that she needs to use the med box and gave ultimatum, either she and her husband do it                  together or I will do it. They decided and comited to doing it together. I will re-evaluate next visit.           Discussed safety and will ask Dr. Danise Mina to order PT to improve safety, teach husband how to assist                  and use gait belt, strengthen muscles.  [facility] CM Care  Plan Problem One        Most Recent Value   Care Plan Problem One  High risk for readmission related to multiple comorbities   Role Documenting the Problem One  Care Management [place] for Problem One  Active   [facility] Long Term Goal (31-90 days)  Ongoing  goal to avoid ED visits and hospitalization over the next 90 days.   [facility] Long Term Goal Start Date  07/27/15   Interventions for Problem One Long Term Goal  Reinforced as always to call me with problems for early intervention.    [facility] CM Care Plan Problem Two        Most Recent Value   Care Plan Problem Two  High Risk for falls   Role Documenting the Problem Two  Care Management [place] for Problem Two  Active   Interventions for Problem Two Long Term Goal   Will request PT for pt and husband teaching of safe mobiolity practices. Encouraged walker use. Don't leave bed if someone is not in the house.   [facility] Long Term Goal (31-90) days  Pt will not sustain injury related to falling over the next 90 days.   [facility]N Long Term Goal Start Date  07/27/15   [facility] CM Short Term Goal #1 (0-30 days)  Pt will participate in PT during the next month.   [facility] CM Short Term Goal #1 Start Date  07/27/15    [facility] CM Care Plan Problem Three        Most Recent Value   Care Plan Problem Three  Medication compliance  Role Documenting the Problem Three  Care Management Coordinator   Care Plan for Problem Three  Active   [facility] CM Short Term Goal #1 (0-30 days)  Pt and husband to fill med box together and pt to take meds from med box daily over the next month.   [facility] CM Short Term Goal #1 Start Date  07/27/15   Interventions for Short Term Goal #1  Renforced need to use med box, will make new med list that is easy to follow and color coordinated.     Deloria Lair [facility] [phone]

## 2015-07-27 NOTE — Telephone Encounter (Signed)
Spoke with Bradd Canary, NP and she states the patient had a hgb yesterday(9.2). She will check her CBC in 2 weeks.

## 2015-07-27 NOTE — Telephone Encounter (Signed)
That's stable and good news. Otherwise yes let's repeat in 2 weeks and hopefully she has her consult scheduled with [facility] in the near future. thanks

## 2015-07-28 ENCOUNTER — Telehealth: Payer: Self-pay | Admitting: Family Medicine

## 2015-07-28 ENCOUNTER — Other Ambulatory Visit: Payer: Self-pay | Admitting: Family Medicine

## 2015-07-28 ENCOUNTER — Encounter: Payer: Self-pay | Admitting: *Deleted

## 2015-07-28 DIAGNOSIS — IMO0001 Reserved for inherently not codable concepts without codable children: Secondary | ICD-10-CM

## 2015-07-28 DIAGNOSIS — R296 Repeated falls: Secondary | ICD-10-CM

## 2015-07-28 DIAGNOSIS — R5381 Other malaise: Secondary | ICD-10-CM

## 2015-07-28 DIAGNOSIS — M1990 Unspecified osteoarthritis, unspecified site: Secondary | ICD-10-CM

## 2015-07-28 MED ORDER — QUETIAPINE FUMARATE 200 MG PO TABS: 200.0000 mg | ORAL_TABLET | Freq: Every day | ORAL | Status: DC

## 2015-07-28 NOTE — Telephone Encounter (Signed)
Sent in

## 2015-07-28 NOTE — Telephone Encounter (Signed)
carroll spinks, np from [facility] called to get an approval for PT for pt.  She needs it for safety, gait, and strengthening It also needs to incl husband as he is primary care giver She has sent report so be on lookout for that cb number is [phone] Thank you

## 2015-07-31 ENCOUNTER — Other Ambulatory Visit: Payer: Self-pay | Admitting: Nurse Practitioner

## 2015-07-31 NOTE — Telephone Encounter (Signed)
Order placed 07/28/2015.

## 2015-07-31 NOTE — Telephone Encounter (Signed)
Message left advising that PT was fine for patient and to please call me back and advise if she was requesting PT for patient's husband as well so that I could document in his chart. (I was confused over the message)

## 2015-08-01 ENCOUNTER — Other Ambulatory Visit: Payer: Self-pay | Admitting: Nurse Practitioner

## 2015-08-01 DIAGNOSIS — M797 Fibromyalgia: Secondary | ICD-10-CM | POA: Diagnosis not present

## 2015-08-01 DIAGNOSIS — I130 Hypertensive heart and chronic kidney disease with heart failure and stage 1 through stage 4 chronic kidney disease, or unspecified chronic kidney disease: Secondary | ICD-10-CM | POA: Diagnosis not present

## 2015-08-01 DIAGNOSIS — N183 Chronic kidney disease, stage 3 (moderate): Secondary | ICD-10-CM | POA: Diagnosis not present

## 2015-08-01 DIAGNOSIS — I2510 Atherosclerotic heart disease of native coronary artery without angina pectoris: Secondary | ICD-10-CM | POA: Diagnosis not present

## 2015-08-01 DIAGNOSIS — M1991 Primary osteoarthritis, unspecified site: Secondary | ICD-10-CM | POA: Diagnosis not present

## 2015-08-01 DIAGNOSIS — J441 Chronic obstructive pulmonary disease with (acute) exacerbation: Secondary | ICD-10-CM | POA: Diagnosis not present

## 2015-08-01 DIAGNOSIS — E1165 Type 2 diabetes mellitus with hyperglycemia: Secondary | ICD-10-CM | POA: Diagnosis not present

## 2015-08-01 DIAGNOSIS — I5032 Chronic diastolic (congestive) heart failure: Secondary | ICD-10-CM | POA: Diagnosis not present

## 2015-08-01 DIAGNOSIS — F333 Major depressive disorder, recurrent, severe with psychotic symptoms: Secondary | ICD-10-CM | POA: Diagnosis not present

## 2015-08-01 DIAGNOSIS — K754 Autoimmune hepatitis: Secondary | ICD-10-CM | POA: Diagnosis not present

## 2015-08-01 DIAGNOSIS — M6281 Muscle weakness (generalized): Secondary | ICD-10-CM | POA: Diagnosis not present

## 2015-08-01 DIAGNOSIS — J9610 Chronic respiratory failure, unspecified whether with hypoxia or hypercapnia: Secondary | ICD-10-CM | POA: Diagnosis not present

## 2015-08-01 DIAGNOSIS — E1122 Type 2 diabetes mellitus with diabetic chronic kidney disease: Secondary | ICD-10-CM | POA: Diagnosis not present

## 2015-08-02 ENCOUNTER — Other Ambulatory Visit: Payer: Self-pay | Admitting: Nurse Practitioner

## 2015-08-03 ENCOUNTER — Other Ambulatory Visit: Payer: Self-pay | Admitting: Family Medicine

## 2015-08-04 DIAGNOSIS — M6281 Muscle weakness (generalized): Secondary | ICD-10-CM | POA: Diagnosis not present

## 2015-08-04 DIAGNOSIS — I5032 Chronic diastolic (congestive) heart failure: Secondary | ICD-10-CM | POA: Diagnosis not present

## 2015-08-04 DIAGNOSIS — E1122 Type 2 diabetes mellitus with diabetic chronic kidney disease: Secondary | ICD-10-CM | POA: Diagnosis not present

## 2015-08-04 DIAGNOSIS — I130 Hypertensive heart and chronic kidney disease with heart failure and stage 1 through stage 4 chronic kidney disease, or unspecified chronic kidney disease: Secondary | ICD-10-CM | POA: Diagnosis not present

## 2015-08-04 DIAGNOSIS — N183 Chronic kidney disease, stage 3 (moderate): Secondary | ICD-10-CM | POA: Diagnosis not present

## 2015-08-04 DIAGNOSIS — E1165 Type 2 diabetes mellitus with hyperglycemia: Secondary | ICD-10-CM | POA: Diagnosis not present

## 2015-08-04 DIAGNOSIS — M1991 Primary osteoarthritis, unspecified site: Secondary | ICD-10-CM | POA: Diagnosis not present

## 2015-08-04 DIAGNOSIS — J449 Chronic obstructive pulmonary disease, unspecified: Secondary | ICD-10-CM | POA: Diagnosis not present

## 2015-08-04 DIAGNOSIS — K754 Autoimmune hepatitis: Secondary | ICD-10-CM | POA: Diagnosis not present

## 2015-08-04 DIAGNOSIS — J9610 Chronic respiratory failure, unspecified whether with hypoxia or hypercapnia: Secondary | ICD-10-CM | POA: Diagnosis not present

## 2015-08-04 DIAGNOSIS — F333 Major depressive disorder, recurrent, severe with psychotic symptoms: Secondary | ICD-10-CM | POA: Diagnosis not present

## 2015-08-04 DIAGNOSIS — I2510 Atherosclerotic heart disease of native coronary artery without angina pectoris: Secondary | ICD-10-CM | POA: Diagnosis not present

## 2015-08-04 DIAGNOSIS — J441 Chronic obstructive pulmonary disease with (acute) exacerbation: Secondary | ICD-10-CM | POA: Diagnosis not present

## 2015-08-04 DIAGNOSIS — M797 Fibromyalgia: Secondary | ICD-10-CM | POA: Diagnosis not present

## 2015-08-08 DIAGNOSIS — J9610 Chronic respiratory failure, unspecified whether with hypoxia or hypercapnia: Secondary | ICD-10-CM | POA: Diagnosis not present

## 2015-08-08 DIAGNOSIS — J441 Chronic obstructive pulmonary disease with (acute) exacerbation: Secondary | ICD-10-CM | POA: Diagnosis not present

## 2015-08-08 DIAGNOSIS — I5032 Chronic diastolic (congestive) heart failure: Secondary | ICD-10-CM | POA: Diagnosis not present

## 2015-08-08 DIAGNOSIS — M1991 Primary osteoarthritis, unspecified site: Secondary | ICD-10-CM | POA: Diagnosis not present

## 2015-08-08 DIAGNOSIS — E1165 Type 2 diabetes mellitus with hyperglycemia: Secondary | ICD-10-CM | POA: Diagnosis not present

## 2015-08-08 DIAGNOSIS — J449 Chronic obstructive pulmonary disease, unspecified: Secondary | ICD-10-CM | POA: Diagnosis not present

## 2015-08-08 DIAGNOSIS — F333 Major depressive disorder, recurrent, severe with psychotic symptoms: Secondary | ICD-10-CM | POA: Diagnosis not present

## 2015-08-08 DIAGNOSIS — I130 Hypertensive heart and chronic kidney disease with heart failure and stage 1 through stage 4 chronic kidney disease, or unspecified chronic kidney disease: Secondary | ICD-10-CM | POA: Diagnosis not present

## 2015-08-08 DIAGNOSIS — M6281 Muscle weakness (generalized): Secondary | ICD-10-CM | POA: Diagnosis not present

## 2015-08-08 DIAGNOSIS — K754 Autoimmune hepatitis: Secondary | ICD-10-CM | POA: Diagnosis not present

## 2015-08-08 DIAGNOSIS — E1122 Type 2 diabetes mellitus with diabetic chronic kidney disease: Secondary | ICD-10-CM | POA: Diagnosis not present

## 2015-08-08 DIAGNOSIS — I2510 Atherosclerotic heart disease of native coronary artery without angina pectoris: Secondary | ICD-10-CM | POA: Diagnosis not present

## 2015-08-08 DIAGNOSIS — M797 Fibromyalgia: Secondary | ICD-10-CM | POA: Diagnosis not present

## 2015-08-08 DIAGNOSIS — N183 Chronic kidney disease, stage 3 (moderate): Secondary | ICD-10-CM | POA: Diagnosis not present

## 2015-08-09 ENCOUNTER — Encounter (HOSPITAL_COMMUNITY): Payer: Self-pay | Admitting: Vascular Surgery

## 2015-08-09 ENCOUNTER — Telehealth: Payer: Self-pay | Admitting: Internal Medicine

## 2015-08-09 ENCOUNTER — Encounter (HOSPITAL_COMMUNITY)
Admission: RE | Admit: 2015-08-09 | Discharge: 2015-08-09 | Disposition: A | Discharge status: Home / Self Care | Payer: Medicare Other | Source: Ambulatory Visit | Attending: Pulmonary Disease | Admitting: Pulmonary Disease

## 2015-08-09 ENCOUNTER — Emergency Department (HOSPITAL_COMMUNITY): Payer: Medicare Other

## 2015-08-09 ENCOUNTER — Emergency Department (HOSPITAL_COMMUNITY)
Admission: EM | Admit: 2015-08-09 | Discharge: 2015-08-09 | Disposition: A | Discharge status: Home / Self Care | Payer: Medicare Other | Attending: Emergency Medicine | Admitting: Emergency Medicine

## 2015-08-09 DIAGNOSIS — E1165 Type 2 diabetes mellitus with hyperglycemia: Secondary | ICD-10-CM | POA: Insufficient documentation

## 2015-08-09 DIAGNOSIS — J449 Chronic obstructive pulmonary disease, unspecified: Secondary | ICD-10-CM | POA: Diagnosis not present

## 2015-08-09 DIAGNOSIS — N390 Urinary tract infection, site not specified: Secondary | ICD-10-CM | POA: Diagnosis not present

## 2015-08-09 DIAGNOSIS — N289 Disorder of kidney and ureter, unspecified: Secondary | ICD-10-CM | POA: Diagnosis not present

## 2015-08-09 DIAGNOSIS — I10 Essential (primary) hypertension: Secondary | ICD-10-CM | POA: Insufficient documentation

## 2015-08-09 DIAGNOSIS — K7460 Unspecified cirrhosis of liver: Secondary | ICD-10-CM | POA: Diagnosis not present

## 2015-08-09 DIAGNOSIS — R739 Hyperglycemia, unspecified: Secondary | ICD-10-CM

## 2015-08-09 DIAGNOSIS — R918 Other nonspecific abnormal finding of lung field: Secondary | ICD-10-CM | POA: Diagnosis not present

## 2015-08-09 DIAGNOSIS — I5032 Chronic diastolic (congestive) heart failure: Secondary | ICD-10-CM | POA: Insufficient documentation

## 2015-08-09 DIAGNOSIS — Q2733 Arteriovenous malformation of digestive system vessel: Secondary | ICD-10-CM | POA: Diagnosis not present

## 2015-08-09 DIAGNOSIS — D649 Anemia, unspecified: Secondary | ICD-10-CM | POA: Diagnosis not present

## 2015-08-09 DIAGNOSIS — I2510 Atherosclerotic heart disease of native coronary artery without angina pectoris: Secondary | ICD-10-CM | POA: Diagnosis not present

## 2015-08-09 LAB — URINALYSIS, ROUTINE W REFLEX MICROSCOPIC
Bilirubin Urine: NEGATIVE
Glucose, UA: 100 mg/dL — AB
Ketones, ur: NEGATIVE mg/dL
Nitrite: POSITIVE — AB
Protein, ur: NEGATIVE mg/dL
SPECIFIC GRAVITY, URINE: 1.015 (ref 1.005–1.030)
pH: 5.5 (ref 5.0–8.0)

## 2015-08-09 LAB — CBC
HCT: 28.2 % — ABNORMAL LOW (ref 36.0–46.0)
Hemoglobin: 8.9 g/dL — ABNORMAL LOW (ref 12.0–15.0)
MCH: 33 pg (ref 26.0–34.0)
MCHC: 31.6 g/dL (ref 30.0–36.0)
MCV: 104.4 fL — AB (ref 78.0–100.0)
PLATELETS: 63 10*3/uL — AB (ref 150–400)
RBC: 2.7 MIL/uL — ABNORMAL LOW (ref 3.87–5.11)
RDW: 16.8 % — AB (ref 11.5–15.5)
WBC: 3.3 10*3/uL — AB (ref 4.0–10.5)

## 2015-08-09 LAB — COMPREHENSIVE METABOLIC PANEL
ALBUMIN: 3.2 g/dL — AB (ref 3.5–5.0)
ALT: 37 U/L (ref 14–54)
AST: 40 U/L (ref 15–41)
Alkaline Phosphatase: 101 U/L (ref 38–126)
Anion gap: 9 (ref 5–15)
BUN: 29 mg/dL — AB (ref 6–20)
CHLORIDE: 101 mmol/L (ref 101–111)
CO2: 28 mmol/L (ref 22–32)
CREATININE: 1.3 mg/dL — AB (ref 0.44–1.00)
Calcium: 9.3 mg/dL (ref 8.9–10.3)
GFR calc Af Amer: 46 mL/min — ABNORMAL LOW (ref >60)
GFR, EST NON AFRICAN AMERICAN: 40 mL/min — AB (ref >60)
GLUCOSE: 216 mg/dL — AB (ref 65–99)
Potassium: 3.9 mmol/L (ref 3.5–5.1)
Sodium: 138 mmol/L (ref 135–145)
Total Bilirubin: 1.3 mg/dL — ABNORMAL HIGH (ref 0.3–1.2)
Total Protein: 6 g/dL — ABNORMAL LOW (ref 6.5–8.1)

## 2015-08-09 LAB — POCT HEMOGLOBIN-HEMACUE: Hemoglobin: 8.6 g/dL — ABNORMAL LOW (ref 12.0–15.0)

## 2015-08-09 LAB — CBG MONITORING, ED: GLUCOSE-CAPILLARY: 169 mg/dL — AB (ref 65–99)

## 2015-08-09 LAB — URINE MICROSCOPIC-ADD ON

## 2015-08-09 MED ORDER — DARBEPOETIN ALFA 200 MCG/0.4ML IJ SOSY
PREFILLED_SYRINGE | INTRAMUSCULAR | Status: AC
  Filled 2015-08-09: qty 0.4

## 2015-08-09 MED ORDER — SULFAMETHOXAZOLE-TRIMETHOPRIM 800-160 MG PO TABS
1.0000 | ORAL_TABLET | Freq: Two times a day (BID) | ORAL | Status: AC
Start: 2015-08-09 — End: 2015-08-16

## 2015-08-09 MED ORDER — DARBEPOETIN ALFA 200 MCG/0.4ML IJ SOSY
200.0000 ug | PREFILLED_SYRINGE | INTRAMUSCULAR | Status: DC
  Administered 2015-08-09: 200 ug via SUBCUTANEOUS

## 2015-08-09 MED ORDER — SULFAMETHOXAZOLE-TRIMETHOPRIM 800-160 MG PO TABS
1.0000 | ORAL_TABLET | Freq: Once | ORAL | Status: AC
  Administered 2015-08-09: 1 via ORAL
  Filled 2015-08-09: qty 1

## 2015-08-09 NOTE — Discharge Instructions (Signed)
Urine sample shows evidence of infection. Prescription for antibiotic. Recommend follow-up with urologist. Phone number given. Increase fluids.

## 2015-08-09 NOTE — ED Notes (Addendum)
Patient verbalized understanding of discharge instructions and denies any further needs or questions at this time. VS stable, patient ambulatory with steady gait. Assisted to ED entrance in wheelchair. 

## 2015-08-09 NOTE — ED Notes (Signed)
Pt reports to the ED for eval of hyperglycemia x 3-4 days. She reports she has been compliant with her medications. Pt reports she took it today and it was 545 mg/dl. Pt denies any polyuria but reports generalized fatigue/weakness, polydipsia, and polyphagia. Pt A&Ox4, resp e/u, and skin warm and dry.

## 2015-08-09 NOTE — ED Provider Notes (Signed)
[name] is a 74 y.o. female who presents c/o hyperglycemia x 3-4 days in the 400s.  Pt reports CBG normally around 200 in AM.  Pt has taken insulin with only minimal improvement.  Pt reports polyuria, dysuria, dry mouth, cough, nasal congestion, generalized weakness, fatigue, polydipsia, intermittent subjective fever.    General: Awake, alert  HEENT: Atraumatic  Resp: Normal effort  Abd: Nondistended  MSK: Moves all extremities  Skin: Warm and dry  BP 146/64 mmHg  Pulse 76  Temp(Src) 98.1 F (36.7 C) (Oral)  Resp 16  SpO2 91%  MSE was initiated and I personally evaluated the patient and placed orders (if any) at  5:30 PM on August 09, 2015.    The patient appears stable so that the remainder of the MSE may be completed by another provider.  Discussed with the patient that exiting the department prior to completion of the work-up is AMA and there is no guarantee that there are no emergency medical conditions present.     Jarrett Soho Ame Heagle, PA-C 08/09/15 [place], MD 08/10/15 [phone]

## 2015-08-09 NOTE — ED Notes (Signed)
MD at bedside. 

## 2015-08-09 NOTE — Telephone Encounter (Signed)
Patient stated her b/s is 545 right now, shannon stated she should go to the ER, and she is going to the ER now

## 2015-08-10 ENCOUNTER — Ambulatory Visit: Payer: Self-pay | Admitting: *Deleted

## 2015-08-10 ENCOUNTER — Other Ambulatory Visit: Payer: Self-pay | Admitting: *Deleted

## 2015-08-10 ENCOUNTER — Telehealth: Payer: Self-pay | Admitting: *Deleted

## 2015-08-10 DIAGNOSIS — J441 Chronic obstructive pulmonary disease with (acute) exacerbation: Secondary | ICD-10-CM | POA: Diagnosis not present

## 2015-08-10 DIAGNOSIS — E1165 Type 2 diabetes mellitus with hyperglycemia: Secondary | ICD-10-CM | POA: Diagnosis not present

## 2015-08-10 DIAGNOSIS — I2510 Atherosclerotic heart disease of native coronary artery without angina pectoris: Secondary | ICD-10-CM | POA: Diagnosis not present

## 2015-08-10 DIAGNOSIS — E1122 Type 2 diabetes mellitus with diabetic chronic kidney disease: Secondary | ICD-10-CM | POA: Diagnosis not present

## 2015-08-10 DIAGNOSIS — K754 Autoimmune hepatitis: Secondary | ICD-10-CM | POA: Diagnosis not present

## 2015-08-10 DIAGNOSIS — M1991 Primary osteoarthritis, unspecified site: Secondary | ICD-10-CM | POA: Diagnosis not present

## 2015-08-10 DIAGNOSIS — M6281 Muscle weakness (generalized): Secondary | ICD-10-CM | POA: Diagnosis not present

## 2015-08-10 DIAGNOSIS — F333 Major depressive disorder, recurrent, severe with psychotic symptoms: Secondary | ICD-10-CM | POA: Diagnosis not present

## 2015-08-10 DIAGNOSIS — I5032 Chronic diastolic (congestive) heart failure: Secondary | ICD-10-CM | POA: Diagnosis not present

## 2015-08-10 DIAGNOSIS — N183 Chronic kidney disease, stage 3 (moderate): Secondary | ICD-10-CM | POA: Diagnosis not present

## 2015-08-10 DIAGNOSIS — J9610 Chronic respiratory failure, unspecified whether with hypoxia or hypercapnia: Secondary | ICD-10-CM | POA: Diagnosis not present

## 2015-08-10 DIAGNOSIS — M797 Fibromyalgia: Secondary | ICD-10-CM | POA: Diagnosis not present

## 2015-08-10 DIAGNOSIS — I130 Hypertensive heart and chronic kidney disease with heart failure and stage 1 through stage 4 chronic kidney disease, or unspecified chronic kidney disease: Secondary | ICD-10-CM | POA: Diagnosis not present

## 2015-08-10 NOTE — Telephone Encounter (Signed)
The PCP can take this over and contact us if significant worsening and she requires transfusion or repeat endoscopy. Has she been seen by [facility] yet? Thanks

## 2015-08-10 NOTE — Patient Outreach (Signed)
S:  Pt had hgb checked yesterday, it was 8.6. Later in the day pt had elevated glucose levels and she reports they have been high all week. She was previously being treated for a UTI by Dr. Danise Mina. She called her primary care office and it was near closing time and she was advised to go to the ED. In reviewing pt glucose log she has had levels up to 545, lowest reading was 185 on 08/03/15 at 7:30 am.  Pt is using her med box now! She is getting PT now which is helping her mobility and teach she and her husband how to help each other. They have a gait belt now.  Regina at [facility], GI, will find out which MD should be following and ordering all her blood work related to her low hgb levels.  O:  BP 124/60 mmHg  Pulse 55  Resp 18  Wt 222 lb (100.699 kg)  SpO2 98% FBS 454 today.       RRR with murmur       Lungs are clear       2+ pedal edema.  A:  NASH      HF      COPD      Anemia      DM  P:  REINFORCED PT TO CALL ME AT FIRST SIGN OF SOMETHING NOT BEING RIGHT.      IF PRIMARY CARE AT [facility] [place] ADVISED YOU TO GO TO ED AND YOU DON'T HAVE A LIFE               THREATENING AILMENT GO TO THE [facility] ON [address].      I will either talk to pt or see her next week according to orders from Dr. Havery Moros.  Deloria Lair [facility] [place] [phone]

## 2015-08-10 NOTE — Telephone Encounter (Signed)
Left a message for Caroll Spink, RNP to call back.

## 2015-08-10 NOTE — Telephone Encounter (Signed)
Spoke with [employer] and told her we will let PCP do labs and follow CBC. Call us as needed.

## 2015-08-10 NOTE — Telephone Encounter (Signed)
Received a call from [employer], RNP. She states the patient had a CBC done on 08/09/15 when she got her Aranesp injection. (Hgb 8.9) It seems that her PCP is ordering this injection and labs. Ms. Myrtie Neither is asking if patient needs to have another CBC next week. Should PCP take this over or does GI need to manage anemia please, advise.

## 2015-08-12 LAB — URINE CULTURE: Culture: >=100000

## 2015-08-14 ENCOUNTER — Telehealth (HOSPITAL_BASED_OUTPATIENT_CLINIC_OR_DEPARTMENT_OTHER): Payer: Self-pay | Admitting: Emergency Medicine

## 2015-08-14 ENCOUNTER — Other Ambulatory Visit: Payer: Self-pay | Admitting: Family Medicine

## 2015-08-14 NOTE — Telephone Encounter (Signed)
Ok to refill 

## 2015-08-14 NOTE — Telephone Encounter (Signed)
Post ED Visit - Positive Culture Follow-up  Culture report reviewed by antimicrobial stewardship pharmacist:  []  Elenor Quinones, Pharm.D. [x]  Heide Guile, Pharm.D., BCPS []  Parks Neptune, Pharm.D. []  Alycia Rossetti, Pharm.D., BCPS []  [place], [place].D., BCPS, AAHIVP []  Legrand Como, Pharm.D., BCPS, AAHIVP []  Milus Glazier, Pharm.D. []  Rob Evette Doffing, Pharm.D.  Positive urine culture e. coli Treated with bactrim DS, organism sensitive to the same and no further patient follow-up is required at this time.  Hazle Nordmann 08/14/2015, 1:12 PM

## 2015-08-15 DIAGNOSIS — N189 Chronic kidney disease, unspecified: Secondary | ICD-10-CM | POA: Diagnosis not present

## 2015-08-15 DIAGNOSIS — I509 Heart failure, unspecified: Secondary | ICD-10-CM | POA: Diagnosis not present

## 2015-08-15 DIAGNOSIS — J45909 Unspecified asthma, uncomplicated: Secondary | ICD-10-CM | POA: Diagnosis not present

## 2015-08-15 DIAGNOSIS — M25512 Pain in left shoulder: Secondary | ICD-10-CM | POA: Diagnosis not present

## 2015-08-15 DIAGNOSIS — S0003XA Contusion of scalp, initial encounter: Secondary | ICD-10-CM | POA: Diagnosis not present

## 2015-08-15 DIAGNOSIS — M8580 Other specified disorders of bone density and structure, unspecified site: Secondary | ICD-10-CM | POA: Diagnosis not present

## 2015-08-15 DIAGNOSIS — E1122 Type 2 diabetes mellitus with diabetic chronic kidney disease: Secondary | ICD-10-CM | POA: Diagnosis not present

## 2015-08-15 DIAGNOSIS — T148 Other injury of unspecified body region: Secondary | ICD-10-CM | POA: Diagnosis not present

## 2015-08-15 DIAGNOSIS — M79602 Pain in left arm: Secondary | ICD-10-CM | POA: Diagnosis not present

## 2015-08-15 DIAGNOSIS — R0781 Pleurodynia: Secondary | ICD-10-CM | POA: Diagnosis not present

## 2015-08-15 DIAGNOSIS — I130 Hypertensive heart and chronic kidney disease with heart failure and stage 1 through stage 4 chronic kidney disease, or unspecified chronic kidney disease: Secondary | ICD-10-CM | POA: Diagnosis not present

## 2015-08-15 DIAGNOSIS — M542 Cervicalgia: Secondary | ICD-10-CM | POA: Diagnosis not present

## 2015-08-15 DIAGNOSIS — J90 Pleural effusion, not elsewhere classified: Secondary | ICD-10-CM | POA: Diagnosis not present

## 2015-08-15 DIAGNOSIS — J449 Chronic obstructive pulmonary disease, unspecified: Secondary | ICD-10-CM | POA: Diagnosis not present

## 2015-08-18 ENCOUNTER — Other Ambulatory Visit: Payer: Self-pay | Admitting: Family Medicine

## 2015-08-18 ENCOUNTER — Other Ambulatory Visit: Payer: Self-pay | Admitting: Cardiovascular Disease

## 2015-08-18 ENCOUNTER — Ambulatory Visit (INDEPENDENT_AMBULATORY_CARE_PROVIDER_SITE_OTHER)
Admission: RE | Admit: 2015-08-18 | Discharge: 2015-08-18 | Disposition: A | Discharge status: Home / Self Care | Payer: Medicare Other | Source: Ambulatory Visit | Attending: Family Medicine | Admitting: Family Medicine

## 2015-08-18 ENCOUNTER — Encounter: Payer: Self-pay | Admitting: Family Medicine

## 2015-08-18 ENCOUNTER — Ambulatory Visit (INDEPENDENT_AMBULATORY_CARE_PROVIDER_SITE_OTHER): Payer: Medicare Other | Admitting: Family Medicine

## 2015-08-18 VITALS — BP 118/60 | HR 66 | Temp 98.1°F | Wt 229.2 lb

## 2015-08-18 DIAGNOSIS — E118 Type 2 diabetes mellitus with unspecified complications: Secondary | ICD-10-CM | POA: Diagnosis not present

## 2015-08-18 DIAGNOSIS — I5032 Chronic diastolic (congestive) heart failure: Secondary | ICD-10-CM

## 2015-08-18 DIAGNOSIS — R0789 Other chest pain: Secondary | ICD-10-CM

## 2015-08-18 DIAGNOSIS — N3090 Cystitis, unspecified without hematuria: Secondary | ICD-10-CM

## 2015-08-18 DIAGNOSIS — N3080 Other cystitis without hematuria: Secondary | ICD-10-CM | POA: Diagnosis not present

## 2015-08-18 DIAGNOSIS — Z794 Long term (current) use of insulin: Secondary | ICD-10-CM

## 2015-08-18 DIAGNOSIS — E1165 Type 2 diabetes mellitus with hyperglycemia: Secondary | ICD-10-CM

## 2015-08-18 DIAGNOSIS — K5901 Slow transit constipation: Secondary | ICD-10-CM

## 2015-08-18 DIAGNOSIS — IMO0002 Reserved for concepts with insufficient information to code with codable children: Secondary | ICD-10-CM

## 2015-08-18 LAB — POCT URINALYSIS DIPSTICK
Bilirubin, UA: NEGATIVE
Blood, UA: NEGATIVE
Glucose, UA: NEGATIVE
Ketones, UA: NEGATIVE
LEUKOCYTES UA: NEGATIVE
NITRITE UA: NEGATIVE
PROTEIN UA: NEGATIVE
Spec Grav, UA: 1.02
UROBILINOGEN UA: 0.2
pH, UA: 6

## 2015-08-18 NOTE — Assessment & Plan Note (Signed)
Some pedal edema noted today - will increase lasix to 80mg  BID x3d then return to prior dose of 40mg  BID.

## 2015-08-18 NOTE — Assessment & Plan Note (Addendum)
Will need to carefully monitor on oxycodone. Continue lactulose daily.

## 2015-08-18 NOTE — Assessment & Plan Note (Addendum)
Concern for sternal fracture (in h/o same) vs diffuse bony contusion/msk strain. Check xrays today. Continue oxycodone. Discussed heating pad use.

## 2015-08-18 NOTE — Assessment & Plan Note (Signed)
Treated for UTI with cipro course 07/17/2015, UCx never resulted by lab.  Seen at ER 08/10/2015 with recurrent UTI growing >100k Ecoli resistant to cipro and sensitive to bactrim so placed on bactrim 10d course. Pt does not know if she took this. Per pharm dispense records she picked up 10d bactrim course 08/10/2015. I asked her to go home today and check if she is taking this and call us right away with update. Endorses persistent dysuria - will recheck UA today and culture if abnormal.
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
## 2015-08-18 NOTE — Assessment & Plan Note (Addendum)
Persistently elevated readings but she checks randomly throughout the day. I asked her to check either prior to a meal or 2 hours after a meal, check twice daily, and contact endocrinologist if persistently high readings. Recent fructosamine 207 stable - corresponds to A1c 7.5%.
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
## 2015-08-18 NOTE — Addendum Note (Signed)
Addended by: Marchia Bond on: 08/18/2015 03:22 PM   Modules accepted: Orders

## 2015-08-18 NOTE — Patient Instructions (Addendum)
Urinalysis today Sternum xray today on left You should be on bactrim (trimethoprim sulfamethoxazole) started 12/15 for UTI - call me today if this is what you're taking.  Start checking sugars twice daily either before a meal or 2 hours after a meal and call Dr Loanne Drilling with your sugar readings if they're too high >200.  Increase lasix to two tablets twice daily for 3 days then decrease back to normal dose of 40mg  twice daily.

## 2015-08-21 DIAGNOSIS — I130 Hypertensive heart and chronic kidney disease with heart failure and stage 1 through stage 4 chronic kidney disease, or unspecified chronic kidney disease: Secondary | ICD-10-CM | POA: Diagnosis not present

## 2015-08-21 DIAGNOSIS — E1122 Type 2 diabetes mellitus with diabetic chronic kidney disease: Secondary | ICD-10-CM | POA: Diagnosis not present

## 2015-08-21 DIAGNOSIS — N183 Chronic kidney disease, stage 3 (moderate): Secondary | ICD-10-CM

## 2015-08-21 DIAGNOSIS — I5032 Chronic diastolic (congestive) heart failure: Secondary | ICD-10-CM | POA: Diagnosis not present

## 2015-08-21 DIAGNOSIS — M6281 Muscle weakness (generalized): Secondary | ICD-10-CM | POA: Diagnosis not present

## 2015-08-21 DIAGNOSIS — J441 Chronic obstructive pulmonary disease with (acute) exacerbation: Secondary | ICD-10-CM

## 2015-08-22 ENCOUNTER — Telehealth: Payer: Self-pay

## 2015-08-22 DIAGNOSIS — M797 Fibromyalgia: Secondary | ICD-10-CM | POA: Diagnosis not present

## 2015-08-22 DIAGNOSIS — M1991 Primary osteoarthritis, unspecified site: Secondary | ICD-10-CM | POA: Diagnosis not present

## 2015-08-22 DIAGNOSIS — I5032 Chronic diastolic (congestive) heart failure: Secondary | ICD-10-CM | POA: Diagnosis not present

## 2015-08-22 DIAGNOSIS — J9610 Chronic respiratory failure, unspecified whether with hypoxia or hypercapnia: Secondary | ICD-10-CM | POA: Diagnosis not present

## 2015-08-22 DIAGNOSIS — E1122 Type 2 diabetes mellitus with diabetic chronic kidney disease: Secondary | ICD-10-CM | POA: Diagnosis not present

## 2015-08-22 DIAGNOSIS — E1165 Type 2 diabetes mellitus with hyperglycemia: Secondary | ICD-10-CM | POA: Diagnosis not present

## 2015-08-22 DIAGNOSIS — K754 Autoimmune hepatitis: Secondary | ICD-10-CM | POA: Diagnosis not present

## 2015-08-22 DIAGNOSIS — N183 Chronic kidney disease, stage 3 (moderate): Secondary | ICD-10-CM | POA: Diagnosis not present

## 2015-08-22 DIAGNOSIS — I2510 Atherosclerotic heart disease of native coronary artery without angina pectoris: Secondary | ICD-10-CM | POA: Diagnosis not present

## 2015-08-22 DIAGNOSIS — M6281 Muscle weakness (generalized): Secondary | ICD-10-CM | POA: Diagnosis not present

## 2015-08-22 DIAGNOSIS — F333 Major depressive disorder, recurrent, severe with psychotic symptoms: Secondary | ICD-10-CM | POA: Diagnosis not present

## 2015-08-22 DIAGNOSIS — I130 Hypertensive heart and chronic kidney disease with heart failure and stage 1 through stage 4 chronic kidney disease, or unspecified chronic kidney disease: Secondary | ICD-10-CM | POA: Diagnosis not present

## 2015-08-22 DIAGNOSIS — J441 Chronic obstructive pulmonary disease with (acute) exacerbation: Secondary | ICD-10-CM | POA: Diagnosis not present

## 2015-08-22 NOTE — Telephone Encounter (Signed)
[name] (no DPR signed) said that when pt got home on 08/18/15 was too late to cb. Pt needs rx for bactrim sent to [facility] [address]. [name] said pt has taken all of bactrim given from ED. Pt was seen 08/18/15.

## 2015-08-23 ENCOUNTER — Other Ambulatory Visit: Payer: Self-pay | Admitting: Family Medicine

## 2015-08-23 ENCOUNTER — Encounter (HOSPITAL_COMMUNITY)
Admission: RE | Admit: 2015-08-23 | Discharge: 2015-08-23 | Disposition: A | Discharge status: Home / Self Care | Payer: Medicare Other | Source: Ambulatory Visit | Attending: Family Medicine | Admitting: Family Medicine

## 2015-08-23 DIAGNOSIS — N289 Disorder of kidney and ureter, unspecified: Secondary | ICD-10-CM | POA: Diagnosis not present

## 2015-08-23 DIAGNOSIS — Q2733 Arteriovenous malformation of digestive system vessel: Secondary | ICD-10-CM | POA: Diagnosis not present

## 2015-08-23 DIAGNOSIS — K7460 Unspecified cirrhosis of liver: Secondary | ICD-10-CM | POA: Diagnosis not present

## 2015-08-23 DIAGNOSIS — D649 Anemia, unspecified: Secondary | ICD-10-CM | POA: Diagnosis not present

## 2015-08-23 DIAGNOSIS — R0789 Other chest pain: Secondary | ICD-10-CM

## 2015-08-23 LAB — POCT HEMOGLOBIN-HEMACUE: HEMOGLOBIN: 8 g/dL — AB (ref 12.0–15.0)

## 2015-08-23 MED ORDER — DARBEPOETIN ALFA 200 MCG/0.4ML IJ SOSY
200.0000 ug | PREFILLED_SYRINGE | INTRAMUSCULAR | Status: DC
  Administered 2015-08-23: 200 ug via SUBCUTANEOUS

## 2015-08-23 MED ORDER — DARBEPOETIN ALFA 200 MCG/0.4ML IJ SOSY
PREFILLED_SYRINGE | INTRAMUSCULAR | Status: AC
  Filled 2015-08-23: qty 0.4

## 2015-08-23 NOTE — Telephone Encounter (Signed)
Pt completed bactrim course. See result note.

## 2015-08-24 ENCOUNTER — Encounter: Payer: Self-pay | Admitting: *Deleted

## 2015-08-24 ENCOUNTER — Ambulatory Visit
Admission: RE | Admit: 2015-08-24 | Discharge: 2015-08-24 | Disposition: A | Discharge status: Home / Self Care | Payer: Medicare Other | Source: Ambulatory Visit | Attending: Family Medicine | Admitting: Family Medicine

## 2015-08-24 DIAGNOSIS — R0789 Other chest pain: Secondary | ICD-10-CM

## 2015-08-24 DIAGNOSIS — S2220XA Unspecified fracture of sternum, initial encounter for closed fracture: Secondary | ICD-10-CM | POA: Diagnosis not present

## 2015-08-25 ENCOUNTER — Other Ambulatory Visit: Payer: Self-pay | Admitting: Family Medicine

## 2015-08-25 DIAGNOSIS — J9610 Chronic respiratory failure, unspecified whether with hypoxia or hypercapnia: Secondary | ICD-10-CM | POA: Diagnosis not present

## 2015-08-25 DIAGNOSIS — M1991 Primary osteoarthritis, unspecified site: Secondary | ICD-10-CM | POA: Diagnosis not present

## 2015-08-25 DIAGNOSIS — I130 Hypertensive heart and chronic kidney disease with heart failure and stage 1 through stage 4 chronic kidney disease, or unspecified chronic kidney disease: Secondary | ICD-10-CM | POA: Diagnosis not present

## 2015-08-25 DIAGNOSIS — K754 Autoimmune hepatitis: Secondary | ICD-10-CM | POA: Diagnosis not present

## 2015-08-25 DIAGNOSIS — I5032 Chronic diastolic (congestive) heart failure: Secondary | ICD-10-CM | POA: Diagnosis not present

## 2015-08-25 DIAGNOSIS — I2510 Atherosclerotic heart disease of native coronary artery without angina pectoris: Secondary | ICD-10-CM | POA: Diagnosis not present

## 2015-08-25 DIAGNOSIS — N183 Chronic kidney disease, stage 3 (moderate): Secondary | ICD-10-CM | POA: Diagnosis not present

## 2015-08-25 DIAGNOSIS — M6281 Muscle weakness (generalized): Secondary | ICD-10-CM | POA: Diagnosis not present

## 2015-08-25 DIAGNOSIS — J441 Chronic obstructive pulmonary disease with (acute) exacerbation: Secondary | ICD-10-CM | POA: Diagnosis not present

## 2015-08-25 DIAGNOSIS — M797 Fibromyalgia: Secondary | ICD-10-CM | POA: Diagnosis not present

## 2015-08-25 DIAGNOSIS — E1122 Type 2 diabetes mellitus with diabetic chronic kidney disease: Secondary | ICD-10-CM | POA: Diagnosis not present

## 2015-08-25 DIAGNOSIS — E1165 Type 2 diabetes mellitus with hyperglycemia: Secondary | ICD-10-CM | POA: Diagnosis not present

## 2015-08-25 DIAGNOSIS — F333 Major depressive disorder, recurrent, severe with psychotic symptoms: Secondary | ICD-10-CM | POA: Diagnosis not present

## 2015-08-25 MED ORDER — OXYCODONE HCL 5 MG PO TABS: 5.0000 mg | ORAL_TABLET | Freq: Four times a day (QID) | ORAL | Status: DC | PRN

## 2015-08-28 ENCOUNTER — Emergency Department (HOSPITAL_COMMUNITY): Payer: Medicare Other

## 2015-08-28 ENCOUNTER — Inpatient Hospital Stay (HOSPITAL_COMMUNITY)
Admission: EM | Admit: 2015-08-28 | Discharge: 2015-09-01 | DRG: 291 | Disposition: A | Discharge status: Home Health | Payer: Medicare Other | Attending: Internal Medicine | Admitting: Internal Medicine

## 2015-08-28 ENCOUNTER — Encounter (HOSPITAL_COMMUNITY): Payer: Self-pay | Admitting: Emergency Medicine

## 2015-08-28 DIAGNOSIS — F419 Anxiety disorder, unspecified: Secondary | ICD-10-CM | POA: Diagnosis present

## 2015-08-28 DIAGNOSIS — I350 Nonrheumatic aortic (valve) stenosis: Secondary | ICD-10-CM | POA: Diagnosis present

## 2015-08-28 DIAGNOSIS — K7581 Nonalcoholic steatohepatitis (NASH): Secondary | ICD-10-CM | POA: Diagnosis not present

## 2015-08-28 DIAGNOSIS — G4733 Obstructive sleep apnea (adult) (pediatric): Secondary | ICD-10-CM | POA: Diagnosis present

## 2015-08-28 DIAGNOSIS — N179 Acute kidney failure, unspecified: Secondary | ICD-10-CM | POA: Diagnosis present

## 2015-08-28 DIAGNOSIS — K219 Gastro-esophageal reflux disease without esophagitis: Secondary | ICD-10-CM | POA: Diagnosis present

## 2015-08-28 DIAGNOSIS — Z794 Long term (current) use of insulin: Secondary | ICD-10-CM | POA: Diagnosis not present

## 2015-08-28 DIAGNOSIS — Z6841 Body Mass Index (BMI) 40.0 and over, adult: Secondary | ICD-10-CM

## 2015-08-28 DIAGNOSIS — M810 Age-related osteoporosis without current pathological fracture: Secondary | ICD-10-CM | POA: Diagnosis present

## 2015-08-28 DIAGNOSIS — R040 Epistaxis: Secondary | ICD-10-CM | POA: Diagnosis present

## 2015-08-28 DIAGNOSIS — Z66 Do not resuscitate: Secondary | ICD-10-CM | POA: Diagnosis present

## 2015-08-28 DIAGNOSIS — K766 Portal hypertension: Secondary | ICD-10-CM | POA: Diagnosis not present

## 2015-08-28 DIAGNOSIS — R0602 Shortness of breath: Secondary | ICD-10-CM | POA: Diagnosis not present

## 2015-08-28 DIAGNOSIS — R0600 Dyspnea, unspecified: Secondary | ICD-10-CM | POA: Diagnosis not present

## 2015-08-28 DIAGNOSIS — Z880 Allergy status to penicillin: Secondary | ICD-10-CM

## 2015-08-28 DIAGNOSIS — K754 Autoimmune hepatitis: Secondary | ICD-10-CM | POA: Diagnosis present

## 2015-08-28 DIAGNOSIS — I5033 Acute on chronic diastolic (congestive) heart failure: Principal | ICD-10-CM | POA: Diagnosis present

## 2015-08-28 DIAGNOSIS — Z9119 Patient's noncompliance with other medical treatment and regimen: Secondary | ICD-10-CM | POA: Diagnosis not present

## 2015-08-28 DIAGNOSIS — Z886 Allergy status to analgesic agent status: Secondary | ICD-10-CM

## 2015-08-28 DIAGNOSIS — Z9981 Dependence on supplemental oxygen: Secondary | ICD-10-CM | POA: Diagnosis not present

## 2015-08-28 DIAGNOSIS — I5032 Chronic diastolic (congestive) heart failure: Secondary | ICD-10-CM | POA: Diagnosis present

## 2015-08-28 DIAGNOSIS — E1122 Type 2 diabetes mellitus with diabetic chronic kidney disease: Secondary | ICD-10-CM | POA: Diagnosis present

## 2015-08-28 DIAGNOSIS — J449 Chronic obstructive pulmonary disease, unspecified: Secondary | ICD-10-CM

## 2015-08-28 DIAGNOSIS — M797 Fibromyalgia: Secondary | ICD-10-CM | POA: Diagnosis present

## 2015-08-28 DIAGNOSIS — K7460 Unspecified cirrhosis of liver: Secondary | ICD-10-CM | POA: Diagnosis present

## 2015-08-28 DIAGNOSIS — J9620 Acute and chronic respiratory failure, unspecified whether with hypoxia or hypercapnia: Secondary | ICD-10-CM | POA: Diagnosis present

## 2015-08-28 DIAGNOSIS — R296 Repeated falls: Secondary | ICD-10-CM

## 2015-08-28 DIAGNOSIS — R5381 Other malaise: Secondary | ICD-10-CM | POA: Diagnosis present

## 2015-08-28 DIAGNOSIS — I10 Essential (primary) hypertension: Secondary | ICD-10-CM | POA: Diagnosis present

## 2015-08-28 DIAGNOSIS — E7800 Pure hypercholesterolemia, unspecified: Secondary | ICD-10-CM | POA: Diagnosis present

## 2015-08-28 DIAGNOSIS — I130 Hypertensive heart and chronic kidney disease with heart failure and stage 1 through stage 4 chronic kidney disease, or unspecified chronic kidney disease: Secondary | ICD-10-CM | POA: Diagnosis not present

## 2015-08-28 DIAGNOSIS — Z79899 Other long term (current) drug therapy: Secondary | ICD-10-CM | POA: Diagnosis not present

## 2015-08-28 DIAGNOSIS — N183 Chronic kidney disease, stage 3 unspecified: Secondary | ICD-10-CM | POA: Diagnosis present

## 2015-08-28 DIAGNOSIS — J441 Chronic obstructive pulmonary disease with (acute) exacerbation: Secondary | ICD-10-CM | POA: Diagnosis present

## 2015-08-28 DIAGNOSIS — Z888 Allergy status to other drugs, medicaments and biological substances status: Secondary | ICD-10-CM | POA: Diagnosis not present

## 2015-08-28 DIAGNOSIS — E1165 Type 2 diabetes mellitus with hyperglycemia: Secondary | ICD-10-CM | POA: Diagnosis not present

## 2015-08-28 DIAGNOSIS — D500 Iron deficiency anemia secondary to blood loss (chronic): Secondary | ICD-10-CM | POA: Diagnosis present

## 2015-08-28 DIAGNOSIS — J9621 Acute and chronic respiratory failure with hypoxia: Secondary | ICD-10-CM | POA: Diagnosis not present

## 2015-08-28 DIAGNOSIS — F333 Major depressive disorder, recurrent, severe with psychotic symptoms: Secondary | ICD-10-CM | POA: Diagnosis present

## 2015-08-28 DIAGNOSIS — Z833 Family history of diabetes mellitus: Secondary | ICD-10-CM | POA: Diagnosis not present

## 2015-08-28 DIAGNOSIS — D696 Thrombocytopenia, unspecified: Secondary | ICD-10-CM | POA: Diagnosis present

## 2015-08-28 DIAGNOSIS — R079 Chest pain, unspecified: Secondary | ICD-10-CM | POA: Diagnosis not present

## 2015-08-28 DIAGNOSIS — G2581 Restless legs syndrome: Secondary | ICD-10-CM | POA: Diagnosis present

## 2015-08-28 DIAGNOSIS — I359 Nonrheumatic aortic valve disorder, unspecified: Secondary | ICD-10-CM | POA: Diagnosis present

## 2015-08-28 DIAGNOSIS — Z8249 Family history of ischemic heart disease and other diseases of the circulatory system: Secondary | ICD-10-CM

## 2015-08-28 DIAGNOSIS — T380X5A Adverse effect of glucocorticoids and synthetic analogues, initial encounter: Secondary | ICD-10-CM | POA: Diagnosis present

## 2015-08-28 DIAGNOSIS — I272 Other secondary pulmonary hypertension: Secondary | ICD-10-CM | POA: Diagnosis present

## 2015-08-28 DIAGNOSIS — J9610 Chronic respiratory failure, unspecified whether with hypoxia or hypercapnia: Secondary | ICD-10-CM | POA: Diagnosis present

## 2015-08-28 DIAGNOSIS — Z87891 Personal history of nicotine dependence: Secondary | ICD-10-CM | POA: Diagnosis not present

## 2015-08-28 DIAGNOSIS — I2510 Atherosclerotic heart disease of native coronary artery without angina pectoris: Secondary | ICD-10-CM | POA: Diagnosis present

## 2015-08-28 DIAGNOSIS — IMO0001 Reserved for inherently not codable concepts without codable children: Secondary | ICD-10-CM | POA: Diagnosis present

## 2015-08-28 DIAGNOSIS — E46 Unspecified protein-calorie malnutrition: Secondary | ICD-10-CM | POA: Diagnosis present

## 2015-08-28 LAB — URINALYSIS, ROUTINE W REFLEX MICROSCOPIC
Bilirubin Urine: NEGATIVE
GLUCOSE, UA: NEGATIVE mg/dL
Hgb urine dipstick: NEGATIVE
KETONES UR: NEGATIVE mg/dL
LEUKOCYTES UA: NEGATIVE
Nitrite: NEGATIVE
PH: 5 (ref 5.0–8.0)
Protein, ur: NEGATIVE mg/dL
Specific Gravity, Urine: 1.017 (ref 1.005–1.030)

## 2015-08-28 LAB — COMPREHENSIVE METABOLIC PANEL
ALBUMIN: 3.2 g/dL — AB (ref 3.5–5.0)
ALT: 26 U/L (ref 14–54)
AST: 36 U/L (ref 15–41)
Alkaline Phosphatase: 103 U/L (ref 38–126)
Anion gap: 9 (ref 5–15)
BUN: 46 mg/dL — AB (ref 6–20)
CHLORIDE: 106 mmol/L (ref 101–111)
CO2: 28 mmol/L (ref 22–32)
Calcium: 8.8 mg/dL — ABNORMAL LOW (ref 8.9–10.3)
Creatinine, Ser: 1.63 mg/dL — ABNORMAL HIGH (ref 0.44–1.00)
GFR calc Af Amer: 35 mL/min — ABNORMAL LOW (ref >60)
GFR, EST NON AFRICAN AMERICAN: 30 mL/min — AB (ref >60)
Glucose, Bld: 170 mg/dL — ABNORMAL HIGH (ref 65–99)
POTASSIUM: 4.6 mmol/L (ref 3.5–5.1)
SODIUM: 143 mmol/L (ref 135–145)
Total Bilirubin: 1.2 mg/dL (ref 0.3–1.2)
Total Protein: 6 g/dL — ABNORMAL LOW (ref 6.5–8.1)

## 2015-08-28 LAB — BRAIN NATRIURETIC PEPTIDE: B NATRIURETIC PEPTIDE 5: 956.3 pg/mL — AB (ref 0.0–100.0)

## 2015-08-28 LAB — GLUCOSE, CAPILLARY
GLUCOSE-CAPILLARY: 338 mg/dL — AB (ref 65–99)
Glucose-Capillary: 316 mg/dL — ABNORMAL HIGH (ref 65–99)
Glucose-Capillary: 339 mg/dL — ABNORMAL HIGH (ref 65–99)

## 2015-08-28 LAB — CBC WITH DIFFERENTIAL/PLATELET
BASOS ABS: 0 10*3/uL (ref 0.0–0.1)
BASOS PCT: 1 %
EOS ABS: 0.1 10*3/uL (ref 0.0–0.7)
EOS PCT: 3 %
HCT: 25.3 % — ABNORMAL LOW (ref 36.0–46.0)
HEMOGLOBIN: 7.9 g/dL — AB (ref 12.0–15.0)
Lymphocytes Relative: 9 %
Lymphs Abs: 0.4 10*3/uL — ABNORMAL LOW (ref 0.7–4.0)
MCH: 33.3 pg (ref 26.0–34.0)
MCHC: 31.2 g/dL (ref 30.0–36.0)
MCV: 106.8 fL — ABNORMAL HIGH (ref 78.0–100.0)
Monocytes Absolute: 0.6 10*3/uL (ref 0.1–1.0)
Monocytes Relative: 12 %
NEUTROS PCT: 76 %
Neutro Abs: 3.8 10*3/uL (ref 1.7–7.7)
PLATELETS: 104 10*3/uL — AB (ref 150–400)
RBC: 2.37 MIL/uL — AB (ref 3.87–5.11)
RDW: 18.8 % — ABNORMAL HIGH (ref 11.5–15.5)
WBC: 5 10*3/uL (ref 4.0–10.5)

## 2015-08-28 LAB — I-STAT TROPONIN, ED: TROPONIN I, POC: 0.04 ng/mL (ref 0.00–0.08)

## 2015-08-28 LAB — TSH: TSH: 1.411 u[IU]/mL (ref 0.350–4.500)

## 2015-08-28 LAB — CBG MONITORING, ED: Glucose-Capillary: 189 mg/dL — ABNORMAL HIGH (ref 65–99)

## 2015-08-28 MED ORDER — DARBEPOETIN ALFA-POLYSORBATE 200 MCG/0.4ML IJ SOLN: 200.0000 ug | INTRAMUSCULAR | Status: DC

## 2015-08-28 MED ORDER — IPRATROPIUM BROMIDE 0.02 % IN SOLN
0.5000 mg | RESPIRATORY_TRACT | Status: DC
  Administered 2015-08-28: 0.5 mg via RESPIRATORY_TRACT
  Filled 2015-08-28: qty 2.5

## 2015-08-28 MED ORDER — SODIUM CHLORIDE 0.9 % IV SOLN: 250.0000 mL | INTRAVENOUS | Status: DC | PRN

## 2015-08-28 MED ORDER — SODIUM CHLORIDE 0.9 % IJ SOLN: 3.0000 mL | INTRAMUSCULAR | Status: DC | PRN

## 2015-08-28 MED ORDER — DARBEPOETIN ALFA 200 MCG/0.4ML IJ SOSY
200.0000 ug | PREFILLED_SYRINGE | INTRAMUSCULAR | Status: DC
  Administered 2015-08-30: 200 ug via SUBCUTANEOUS
  Filled 2015-08-28: qty 0.4

## 2015-08-28 MED ORDER — OXYCODONE HCL 5 MG PO TABS
5.0000 mg | ORAL_TABLET | ORAL | Status: DC | PRN
  Administered 2015-08-28 – 2015-08-31 (×4): 5 mg via ORAL
  Filled 2015-08-28 (×4): qty 1

## 2015-08-28 MED ORDER — METHYLPREDNISOLONE SODIUM SUCC 125 MG IJ SOLR
125.0000 mg | Freq: Once | INTRAMUSCULAR | Status: AC
  Administered 2015-08-28: 125 mg via INTRAVENOUS
  Filled 2015-08-28: qty 2

## 2015-08-28 MED ORDER — ACETAMINOPHEN 325 MG PO TABS
650.0000 mg | ORAL_TABLET | Freq: Four times a day (QID) | ORAL | Status: DC | PRN
Start: 2015-08-28 — End: 2015-09-01

## 2015-08-28 MED ORDER — LEVOFLOXACIN IN D5W 750 MG/150ML IV SOLN
750.0000 mg | INTRAVENOUS | Status: DC
  Administered 2015-08-28 – 2015-08-30 (×2): 750 mg via INTRAVENOUS
  Filled 2015-08-28 (×3): qty 150

## 2015-08-28 MED ORDER — ALBUTEROL (5 MG/ML) CONTINUOUS INHALATION SOLN
10.0000 mg/h | INHALATION_SOLUTION | RESPIRATORY_TRACT | Status: DC
  Administered 2015-08-28: 10 mg/h via RESPIRATORY_TRACT
  Filled 2015-08-28: qty 20

## 2015-08-28 MED ORDER — ALBUTEROL SULFATE (2.5 MG/3ML) 0.083% IN NEBU
2.5000 mg | INHALATION_SOLUTION | RESPIRATORY_TRACT | Status: DC | PRN
  Administered 2015-08-28: 2.5 mg via RESPIRATORY_TRACT

## 2015-08-28 MED ORDER — ACETAMINOPHEN 650 MG RE SUPP: 650.0000 mg | Freq: Four times a day (QID) | RECTAL | Status: DC | PRN

## 2015-08-28 MED ORDER — SODIUM CHLORIDE 0.9 % IJ SOLN
3.0000 mL | Freq: Two times a day (BID) | INTRAMUSCULAR | Status: DC
  Administered 2015-08-28 – 2015-08-31 (×4): 3 mL via INTRAVENOUS

## 2015-08-28 MED ORDER — FUROSEMIDE 10 MG/ML IJ SOLN
60.0000 mg | Freq: Two times a day (BID) | INTRAMUSCULAR | Status: DC
  Administered 2015-08-28 – 2015-08-30 (×4): 60 mg via INTRAVENOUS
  Filled 2015-08-28 (×5): qty 6

## 2015-08-28 MED ORDER — IPRATROPIUM-ALBUTEROL 0.5-2.5 (3) MG/3ML IN SOLN: 3.0000 mL | RESPIRATORY_TRACT | Status: DC

## 2015-08-28 MED ORDER — ALPRAZOLAM 0.5 MG PO TABS
1.0000 mg | ORAL_TABLET | Freq: Two times a day (BID) | ORAL | Status: DC
  Administered 2015-08-28 – 2015-09-01 (×9): 1 mg via ORAL
  Filled 2015-08-28 (×7): qty 2
  Filled 2015-08-28: qty 4
  Filled 2015-08-28: qty 2

## 2015-08-28 MED ORDER — FUROSEMIDE 10 MG/ML IJ SOLN
60.0000 mg | Freq: Once | INTRAMUSCULAR | Status: AC
  Administered 2015-08-28: 60 mg via INTRAVENOUS
  Filled 2015-08-28: qty 6

## 2015-08-28 MED ORDER — ROPINIROLE HCL 1 MG PO TABS
4.0000 mg | ORAL_TABLET | Freq: Every day | ORAL | Status: DC
  Administered 2015-08-28 – 2015-09-01 (×5): 4 mg via ORAL
  Filled 2015-08-28 (×6): qty 4

## 2015-08-28 MED ORDER — POLYETHYLENE GLYCOL 3350 17 G PO PACK: 17.0000 g | PACK | Freq: Every day | ORAL | Status: DC | PRN

## 2015-08-28 MED ORDER — CARVEDILOL 3.125 MG PO TABS
1.5600 mg | ORAL_TABLET | Freq: Every day | ORAL | Status: DC
  Administered 2015-08-29 – 2015-09-01 (×4): 1.56 mg via ORAL
  Filled 2015-08-28 (×5): qty 1

## 2015-08-28 MED ORDER — BUPROPION HCL ER (XL) 150 MG PO TB24
150.0000 mg | ORAL_TABLET | Freq: Every day | ORAL | Status: DC
  Administered 2015-08-28 – 2015-09-01 (×5): 150 mg via ORAL
  Filled 2015-08-28 (×5): qty 1

## 2015-08-28 MED ORDER — DARBEPOETIN ALFA 200 MCG/0.4ML IJ SOSY: 200.0000 ug | PREFILLED_SYRINGE | INTRAMUSCULAR | Status: DC

## 2015-08-28 MED ORDER — INSULIN GLARGINE 100 UNIT/ML ~~LOC~~ SOLN
120.0000 [IU] | Freq: Every day | SUBCUTANEOUS | Status: DC
  Administered 2015-08-29 – 2015-08-30 (×2): 120 [IU] via SUBCUTANEOUS
  Filled 2015-08-28 (×3): qty 1.2

## 2015-08-28 MED ORDER — SODIUM CHLORIDE 0.9 % IJ SOLN
3.0000 mL | Freq: Two times a day (BID) | INTRAMUSCULAR | Status: DC
  Administered 2015-08-28 – 2015-08-31 (×6): 3 mL via INTRAVENOUS

## 2015-08-28 MED ORDER — VENLAFAXINE HCL ER 37.5 MG PO CP24
37.5000 mg | ORAL_CAPSULE | Freq: Every day | ORAL | Status: DC
  Administered 2015-08-29 – 2015-09-01 (×5): 37.5 mg via ORAL
  Filled 2015-08-28 (×6): qty 1

## 2015-08-28 MED ORDER — QUETIAPINE FUMARATE 25 MG PO TABS
200.0000 mg | ORAL_TABLET | Freq: Every day | ORAL | Status: DC
  Administered 2015-08-28 – 2015-08-31 (×4): 200 mg via ORAL
  Filled 2015-08-28 (×4): qty 8

## 2015-08-28 MED ORDER — FERROUS SULFATE 325 (65 FE) MG PO TABS
325.0000 mg | ORAL_TABLET | Freq: Every day | ORAL | Status: DC
  Administered 2015-08-29 – 2015-09-01 (×4): 325 mg via ORAL
  Filled 2015-08-28 (×4): qty 1

## 2015-08-28 MED ORDER — SIMVASTATIN 40 MG PO TABS
40.0000 mg | ORAL_TABLET | Freq: Every day | ORAL | Status: DC
  Administered 2015-08-28 – 2015-09-01 (×5): 40 mg via ORAL
  Filled 2015-08-28 (×5): qty 1

## 2015-08-28 MED ORDER — PANTOPRAZOLE SODIUM 40 MG PO TBEC
40.0000 mg | DELAYED_RELEASE_TABLET | Freq: Two times a day (BID) | ORAL | Status: DC
  Administered 2015-08-28 – 2015-09-01 (×9): 40 mg via ORAL
  Filled 2015-08-28 (×9): qty 1

## 2015-08-28 MED ORDER — RIFAXIMIN 550 MG PO TABS
550.0000 mg | ORAL_TABLET | Freq: Two times a day (BID) | ORAL | Status: DC
  Administered 2015-08-28 – 2015-09-01 (×8): 550 mg via ORAL
  Filled 2015-08-28 (×8): qty 1

## 2015-08-28 MED ORDER — IPRATROPIUM-ALBUTEROL 0.5-2.5 (3) MG/3ML IN SOLN: 3.0000 mL | Freq: Four times a day (QID) | RESPIRATORY_TRACT | Status: DC

## 2015-08-28 MED ORDER — LORATADINE 10 MG PO TABS
10.0000 mg | ORAL_TABLET | Freq: Every day | ORAL | Status: DC
  Administered 2015-08-28 – 2015-09-01 (×5): 10 mg via ORAL
  Filled 2015-08-28 (×5): qty 1

## 2015-08-28 MED ORDER — ALBUTEROL SULFATE (2.5 MG/3ML) 0.083% IN NEBU
2.5000 mg | INHALATION_SOLUTION | RESPIRATORY_TRACT | Status: DC | PRN
  Filled 2015-08-28: qty 3

## 2015-08-28 MED ORDER — INSULIN ASPART 100 UNIT/ML ~~LOC~~ SOLN
0.0000 [IU] | Freq: Every day | SUBCUTANEOUS | Status: DC
Start: 2015-08-28 — End: 2015-09-01
  Administered 2015-08-28 – 2015-08-30 (×3): 5 [IU] via SUBCUTANEOUS
  Administered 2015-08-31: 2 [IU] via SUBCUTANEOUS

## 2015-08-28 MED ORDER — LACTULOSE ENCEPHALOPATHY 10 GM/15ML PO SOLN
20.0000 g | Freq: Two times a day (BID) | ORAL | Status: DC | PRN
  Filled 2015-08-28: qty 30

## 2015-08-28 MED ORDER — IPRATROPIUM-ALBUTEROL 0.5-2.5 (3) MG/3ML IN SOLN
3.0000 mL | Freq: Four times a day (QID) | RESPIRATORY_TRACT | Status: DC
  Administered 2015-08-28 – 2015-09-01 (×11): 3 mL via RESPIRATORY_TRACT
  Filled 2015-08-28 (×13): qty 3

## 2015-08-28 MED ORDER — INSULIN ASPART 100 UNIT/ML ~~LOC~~ SOLN
0.0000 [IU] | Freq: Three times a day (TID) | SUBCUTANEOUS | Status: DC
  Administered 2015-08-28 (×2): 15 [IU] via SUBCUTANEOUS
  Administered 2015-08-29: 10 [IU] via SUBCUTANEOUS
  Administered 2015-08-29 – 2015-08-30 (×3): 20 [IU] via SUBCUTANEOUS
  Administered 2015-08-30: 11 [IU] via SUBCUTANEOUS
  Administered 2015-08-30 – 2015-08-31 (×2): 15 [IU] via SUBCUTANEOUS
  Administered 2015-08-31: 11 [IU] via SUBCUTANEOUS
  Administered 2015-08-31 – 2015-09-01 (×2): 20 [IU] via SUBCUTANEOUS

## 2015-08-28 MED ORDER — METHYLPREDNISOLONE SODIUM SUCC 125 MG IJ SOLR
80.0000 mg | Freq: Four times a day (QID) | INTRAMUSCULAR | Status: DC
  Administered 2015-08-28 – 2015-08-30 (×9): 80 mg via INTRAVENOUS
  Filled 2015-08-28 (×9): qty 2

## 2015-08-28 MED ORDER — ALBUTEROL SULFATE (2.5 MG/3ML) 0.083% IN NEBU
2.5000 mg | INHALATION_SOLUTION | RESPIRATORY_TRACT | Status: DC
  Administered 2015-08-28: 2.5 mg via RESPIRATORY_TRACT
  Filled 2015-08-28: qty 3

## 2015-08-28 MED ORDER — GUAIFENESIN ER 600 MG PO TB12
600.0000 mg | ORAL_TABLET | Freq: Two times a day (BID) | ORAL | Status: DC
  Administered 2015-08-28 – 2015-09-01 (×8): 600 mg via ORAL
  Filled 2015-08-28 (×8): qty 1

## 2015-08-28 NOTE — ED Notes (Signed)
Ordered diet tray 
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
## 2015-08-28 NOTE — Progress Notes (Signed)
PT Cancellation Note  Patient Details Name: [name] MRN: [id number] DOB: January 25, 1941   Cancelled Treatment:    Reason Eval/Treat Not Completed: Patient not medically ready Pt just came up from ED and is having labored breathing at rest. Will follow up next available time.   Marguarite Arbour A Keeghan Mcintire 08/28/2015, 3:51 PM Wray Kearns, [place], DPT [phone]

## 2015-08-28 NOTE — Clinical Social Work Note (Signed)
Clinical Social Work Assessment  Patient Details  Name: [name] MRN: [id number] Date of Birth: Sep 24, 1940  Date of referral:  08/28/15               Reason for consult:  Discharge Planning                Permission sought to share information with:  Family Supports, Chartered certified accountant granted to share information::  Yes, Verbal Permission Granted  Name::        Agency::     Relationship::     Contact Information:     Housing/Transportation Living arrangements for the past 2 months:  Single Family Home Source of Information:  Patient, Friend/Neighbor Patient Interpreter Needed:  None Criminal Activity/Legal Involvement Pertinent to Current Situation/Hospitalization:  No - Comment as needed Significant Relationships:  Adult Children, Siblings, Spouse, Neighbor Lives with:  Spouse Do you feel safe going back to the place where you live?  Yes Need for family participation in patient care:  Yes (Comment)  Care giving concerns:  Patient reports that she cannot walk and has to utilize her husband for assistance and fears that this may prevent her from living independently. Patient's neighbor reports that patient has walker, wheelchairs, a hospital bed, and beside commode in place at home as well as [place] home health care and feels that patient can return home upon discharge.    Social Worker assessment / plan:  CSW engaged with patient at her bedside to introduce herself and to begin discussion of discharge planning. Patient's neighbor was present with Patient's permission. Patient reports that she is currently receiving home health through [facility] and sees a physical therapist a couple times a week. She reports that she also has a home care nurse, Bradd Canary, that she see's frequently. Patient reports that hse is familiar with SNF placement from previous admissions. Patient will be followed by CSW on inpatient unit regarding disposition home vs. SNF. Patient  agreeable to plans at this time.   Employment status:  Disabled (Comment on whether or not currently receiving Disability) Insurance information:  Managed Medicare PT Recommendations:  Not assessed at this time Information / Referral to community resources:     Patient/Family's Response to care: Patient reports that she would like to prior level of independence with continued home health already in place if possible. Patient reports that she is open to placement if necessary.   Patient/Family's Understanding of and Emotional Response to Diagnosis, Current Treatment, and Prognosis:  Patient able to verbalize strong understanding of her current diagnosis, treatment, and prognosis. She responds in a positive manner and verbalizes minor anxiety. Patient reports strong supports currently in place.   Emotional Assessment Appearance:  Appears stated age Attitude/Demeanor/Rapport:  Sedated (Cooperative; Anxious) Affect (typically observed):  Anxious, Appropriate Orientation:  Oriented to Self, Oriented to Place, Oriented to  Time, Oriented to Situation Alcohol / Substance use:  Not Applicable Psych involvement (Current and /or in the community):  No (Comment)  Discharge Needs  Concerns to be addressed:  Discharge Planning Concerns Readmission within the last 30 days:  No Current discharge risk:  None Barriers to Discharge:  No Barriers Identified   Judeth Horn, LCSW 08/28/2015, 10:58 AM
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
## 2015-08-28 NOTE — ED Notes (Signed)
Nurse on [address] [place] will call back for report--

## 2015-08-28 NOTE — ED Notes (Signed)
Report given to [place]. Pt will go to [address].

## 2015-08-28 NOTE — ED Notes (Signed)
[name] Carrel, NP notified of pt's condition-- saw pt-- will change bed to a step down bed

## 2015-08-28 NOTE — Progress Notes (Signed)
75 yo With hx of COPD and CHF here with shortness of breath, slowly getting worse anemia  COPD exacerbation vs CHF needs admission orders Accepted to tele bed.   Teleshia Lemere 6:22 AM

## 2015-08-28 NOTE — ED Notes (Signed)
When pulling pt up in the bed, preparing for transport, pt became short of breath and anxious. Xanax given and resp treatment started. Will re-evaluate after treatment.
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
## 2015-08-28 NOTE — Consult Note (Signed)
ANTIBIOTIC CONSULT NOTE - INITIAL  Pharmacy Consult for Levaquin Indication: COPD exacerbation  Allergies  Allergen Reactions  . Acetaminophen Other (See Comments)    Liver dysfunction  . Aspirin Other (See Comments)    Causes nosebleeds  . Theophylline Nausea And Vomiting  . Nsaids     Liver dysfunction   . Penicillins Itching    Has patient had a PCN reaction causing immediate rash, facial/tongue/throat swelling, SOB or lightheadedness with hypotension: unknown Has patient had a PCN reaction causing severe rash involving mucus membranes or skin necrosis: unknown Has patient had a PCN reaction that required hospitalization unknown Has patient had a PCN reaction occurring within the last 10 years: yes If all of the above answers are "NO", then may proceed with Cephalosporin use.     Vital Signs: Temp: 98.8 F (37.1 C) (01/02 0503) Temp Source: Oral (01/02 0503) BP: 132/56 mmHg (01/02 0845) Pulse Rate: 54 (01/02 0845) Intake/Output from previous day:   Intake/Output from this shift:    Labs:  Recent Labs  08/28/15 0521  WBC 5.0  HGB 7.9*  PLT 104*  CREATININE 1.63*   Estimated Creatinine Clearance: 35.7 mL/min (by C-G formula based on Cr of 1.63).  Microbiology: Recent Results (from the past 720 hour(s))  Urine culture     Status: None   Collection Time: 08/09/15  7:23 PM  Result Value Ref Range Status   Specimen Description URINE, CLEAN CATCH  Final   Special Requests Immunocompromised  Final   Culture >=100,000 COLONIES/mL ESCHERICHIA COLI  Final   Report Status 08/12/2015 FINAL  Final   Organism ID, Bacteria ESCHERICHIA COLI  Final      Susceptibility   Escherichia coli - MIC*    AMPICILLIN >=32 RESISTANT Resistant     CEFAZOLIN <=4 SENSITIVE Sensitive     CEFTRIAXONE <=1 SENSITIVE Sensitive     CIPROFLOXACIN >=4 RESISTANT Resistant     GENTAMICIN 2 SENSITIVE Sensitive     IMIPENEM <=0.25 SENSITIVE Sensitive     NITROFURANTOIN <=16 SENSITIVE Sensitive      TRIMETH/SULFA <=20 SENSITIVE Sensitive     AMPICILLIN/SULBACTAM >=32 RESISTANT Resistant     PIP/TAZO <=4 SENSITIVE Sensitive     * >=100,000 COLONIES/mL ESCHERICHIA COLI    Medical History: Past Medical History  Diagnosis Date  . Chronic airway obstruction, not elsewhere classified   . Unspecified chronic bronchitis ([place])   . Unspecified essential hypertension   . Non-obstructive CAD   . Palpitations   . Mild aortic stenosis     a. 03/2014 Valve area (VTI): 2.89 cm^2, Valve area (Vmax): 2.7 cm^2.  . Pure hypercholesterolemia   . Morbid obesity ([place])   . Esophageal reflux   . GAVE (gastric antral vascular ectasia)     angiodysplasia  . Diverticulosis of colon (without mention of hemorrhage)   . Benign neoplasm of colon   . Osteoarthrosis, unspecified whether generalized or localized, unspecified site   . Osteoporosis, unspecified   . Restless legs syndrome (RLS)   . Major depressive disorder, recurrent episode, severe, specified as with psychotic behavior   . Iron deficiency anemia secondary to blood loss (chronic)     a. frequent PRBC transfusions.  . Coarse tremors     a. arms.  . Carpal tunnel syndrome on both sides   . Chronic diastolic CHF (congestive heart failure) ([place])     a. 03/26/2014 Echo: EF 55-60%, no rwma, mild AS/AI, mod-sev Ca2+ MV annulus, mildly to mod dil LA.  [name] Asthma   .  Type II diabetes mellitus ([place])   . H/O hiatal hernia   . 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 dx'd 1990's  . Adenomatous colon polyp   . Midsternal chest pain     a. conservatively managed ->poor candidate for cath/anticoagulation.  [name] Anxiety   . Portal hypertensive gastropathy   . Falls frequently     completed HHPT/OT 06/2014, PT unmet goals  . Recurrent UTI (urinary tract infection)     h/o hospitalization with urosepsis 2015, but thought large component colonization/bacteriuria, only treat if symptomatic ([place])  . Hiatal hernia   .  Thrombocytopenia ([place])   . Protein calorie malnutrition ([place])   . History of pneumonia     CAP 2015  . On home oxygen therapy     "3L; 24/7" (01/25/2015)  . GAVE (gastric antral vascular ectasia)     s/p multiple APCs, discussing RFA with [facility] GI Dr Newman Pies (06/2015)  . OSA (obstructive sleep apnea)     a. does not use CPAP. (01/25/2015)  . Hepatitis    Assessment: 89yof with hx COPD on 3L home oxygen presents to the ED with SOB, likely related to an exacerbation in the setting of mild CHF. She will begin IV levaquin. She's in acute renal failure with sCr 1.63 and CrCl ~ 42ml/min.  Goal of Therapy:  Appropriate dosing  Plan:  1) Levaquin 750mg  IV q48 2) Follow renal function, LOT  Deboraha Sprang 08/28/2015,9:58 AM

## 2015-08-29 ENCOUNTER — Inpatient Hospital Stay (HOSPITAL_COMMUNITY): Payer: Medicare Other

## 2015-08-29 DIAGNOSIS — R5381 Other malaise: Secondary | ICD-10-CM

## 2015-08-29 DIAGNOSIS — R0600 Dyspnea, unspecified: Secondary | ICD-10-CM

## 2015-08-29 DIAGNOSIS — N179 Acute kidney failure, unspecified: Secondary | ICD-10-CM

## 2015-08-29 DIAGNOSIS — N183 Chronic kidney disease, stage 3 (moderate): Secondary | ICD-10-CM

## 2015-08-29 LAB — COMPREHENSIVE METABOLIC PANEL
ALBUMIN: 2.9 g/dL — AB (ref 3.5–5.0)
ALT: 22 U/L (ref 14–54)
ANION GAP: 8 (ref 5–15)
AST: 30 U/L (ref 15–41)
Alkaline Phosphatase: 101 U/L (ref 38–126)
BILIRUBIN TOTAL: 1 mg/dL (ref 0.3–1.2)
BUN: 45 mg/dL — ABNORMAL HIGH (ref 6–20)
CHLORIDE: 104 mmol/L (ref 101–111)
CO2: 29 mmol/L (ref 22–32)
Calcium: 8.8 mg/dL — ABNORMAL LOW (ref 8.9–10.3)
Creatinine, Ser: 1.43 mg/dL — ABNORMAL HIGH (ref 0.44–1.00)
GFR calc Af Amer: 41 mL/min — ABNORMAL LOW (ref >60)
GFR calc non Af Amer: 36 mL/min — ABNORMAL LOW (ref >60)
GLUCOSE: 383 mg/dL — AB (ref 65–99)
POTASSIUM: 4.5 mmol/L (ref 3.5–5.1)
SODIUM: 141 mmol/L (ref 135–145)
TOTAL PROTEIN: 5.7 g/dL — AB (ref 6.5–8.1)

## 2015-08-29 LAB — GLUCOSE, CAPILLARY
GLUCOSE-CAPILLARY: 458 mg/dL — AB (ref 65–99)
Glucose-Capillary: 391 mg/dL — ABNORMAL HIGH (ref 65–99)
Glucose-Capillary: 491 mg/dL — ABNORMAL HIGH (ref 65–99)
Glucose-Capillary: 392 mg/dL — ABNORMAL HIGH (ref 65–99)
Glucose-Capillary: 386 mg/dL — ABNORMAL HIGH (ref 65–99)

## 2015-08-29 LAB — CBC
HEMATOCRIT: 23.2 % — AB (ref 36.0–46.0)
Hemoglobin: 7.1 g/dL — ABNORMAL LOW (ref 12.0–15.0)
MCH: 32.6 pg (ref 26.0–34.0)
MCHC: 30.6 g/dL (ref 30.0–36.0)
MCV: 106.4 fL — AB (ref 78.0–100.0)
Platelets: 76 10*3/uL — ABNORMAL LOW (ref 150–400)
RBC: 2.18 MIL/uL — ABNORMAL LOW (ref 3.87–5.11)
RDW: 18.5 % — ABNORMAL HIGH (ref 11.5–15.5)
WBC: 3.1 10*3/uL — ABNORMAL LOW (ref 4.0–10.5)

## 2015-08-29 LAB — HEMOGLOBIN A1C
HEMOGLOBIN A1C: 6.3 % — AB (ref 4.8–5.6)
Mean Plasma Glucose: 134 mg/dL

## 2015-08-29 MED ORDER — INSULIN ASPART 100 UNIT/ML ~~LOC~~ SOLN
10.0000 [IU] | Freq: Three times a day (TID) | SUBCUTANEOUS | Status: DC
  Administered 2015-08-29 – 2015-08-30 (×4): 10 [IU] via SUBCUTANEOUS

## 2015-08-29 NOTE — Progress Notes (Addendum)
Inpatient Diabetes Program Recommendations  AACE/ADA: New Consensus Statement on Inpatient Glycemic Control (2015)  Target Ranges:  Prepandial:   less than 140 mg/dL      Peak postprandial:   less than 180 mg/dL (1-2 hours)      Critically ill patients:  140 - 180 mg/dL   Results for [name], [name] (MRN [id number]) as of 08/29/2015 09:07  Ref. Range 08/28/2015 11:35 08/28/2015 14:56 08/28/2015 16:32 08/28/2015 21:17 08/29/2015 06:28  Glucose-Capillary Latest Ref Range: 65-99 mg/dL 189 (H) 338 (H) 316 (H) 339 (H) 391 (H)   Review of Glycemic Control  Diabetes history: DM 2 Outpatient Diabetes medications: Lantus 120 units, Humalog 10 units Daily at supper Current orders for Inpatient glycemic control: Lantus 120, Novolog Resistant, Novolog HS scale  Patient on IV solumedrol 80 mg Q6hrs.  Inpatient Diabetes Program Recommendations: Insulin - Meal Coverage: Patient's glucose in the 300's. Patient requiring at least 15 units of Novolog with each meal. Please consider adding Novolog 10 units TID meal coverage in addition to correction.  Thanks,  Tama Headings RN, MSN, [facility] Inpatient Diabetes Coordinator Team Pager [phone] (8a-5p)

## 2015-08-29 NOTE — Progress Notes (Signed)
Nutrition Consult/Brief Note  RD consulted for nutrition education regarding weight loss.  Body mass index is 43.14 kg/(m^2). Pt meets criteria for Obesity Class III based on current BMI.  Patient on bedside commode upon RD visit; left "Weight Loss Tips" handout from the Academy of [employer] Dietetics on pt's night stand.  Current diet order is Heart Healthy/Carbohydrate Modified, patient is consuming approximately 70% of meals at this time. Labs and medications reviewed. No further nutrition interventions warranted at this time. If additional nutrition issues arise, please re-consult RD.  Arthur Holms, RD, LDN Pager #: [phone] After-Hours Pager #: [phone]

## 2015-08-29 NOTE — Progress Notes (Signed)
UR Completed. Erique Kaser, RN, BSN.  [phone] 

## 2015-08-29 NOTE — Consult Note (Signed)
   [facility] CM Inpatient Consult   08/29/2015  [name] 07-10-1941 [id number] Came by to see patient but was receiving care.  Patient is currently active [prior to admission] with [place] [facility] for chronic disease management services.  Patient has been engaged by a [employer] .  Our community based plan of care has focused on disease management and community resource support.  Patient will receive a post discharge transition of care call and will be evaluated for monthly home visits for assessments and disease process education.  Updated  Inpatient Case Manager to make her aware that [place] [facility] following. Of note, [facility] Care Management services does not replace or interfere with any services that are needed or arranged by inpatient case management or social work.  For additional questions or referrals please contact: Natividad Brood, RN BSN [facility] Liaison  [phone] business mobile phone
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
## 2015-08-29 NOTE — Progress Notes (Signed)
Echocardiogram 2D Echocardiogram has been performed.  [name] 08/29/2015, 4:36 PM

## 2015-08-29 NOTE — Evaluation (Signed)
Physical Therapy Evaluation Patient Details Name: [name] MRN: [id number] DOB: [birth year] Today's Date: 08/29/2015   History of Present Illness  Patient is a 75 y/o female withhx of COPD, HTN, CAD, CHF, DM, asthma, depression with psychotic behavior, RLS, NASH and anemia presents with Acute on chronic respiratory failure likely related to COPD exacerbation in the setting of CHF exacerbation. CXR-vascular congestion concerning for pulmonary edema.  Clinical Impression  Patient presents with functional limitations due to deficits listed in PT problem list (see below). Pt with dyspnea on exertion, decreased balance, impaired strength and impaired mobility impacting functional independence. Pt requires assist for ambulation at home and has 24/7 S from family/friends. Tolerated SPT to [facility] with Min A. Mobility assessment limited as pt wanting to sit for awhile to have a BM. Will follow acutely to maximize independence and mobility prior to return home.    Follow Up Recommendations Home health PT;Supervision for mobility/OOB (continuance of HHPT)    Equipment Recommendations  None recommended by PT    Recommendations for Other Services OT consult     Precautions / Restrictions Precautions Precautions: Fall Restrictions Weight Bearing Restrictions: No      Mobility  Bed Mobility               General bed mobility comments: Sitting EOB upon PT arrival.   Transfers Overall transfer level: Needs assistance Equipment used: 1 person hand held assist;Rolling walker (2 wheeled) Transfers: Sit to/from [facility] Sit to Stand: Min assist Stand pivot transfers: Min assist       General transfer comment: Min A to boost from EOB with cues for hand placement. SPT bed to [facility]. 2/4 DOE post transfer  Ambulation/Gait Ambulation/Gait assistance:  (Not assessed this session as pt eager to have a BM,)              Stairs            Wheelchair Mobility     Modified Rankin (Stroke Patients Only)       Balance Overall balance assessment: Needs assistance Sitting-balance support: Feet supported;No upper extremity supported Sitting balance-Leahy Scale: Fair     Standing balance support: During functional activity Standing balance-Leahy Scale: Poor Standing balance comment: Requires assist or UE support for balance in static/dynamc standing.                             Pertinent Vitals/Pain Pain Assessment: No/denies pain    Home Living Family/patient expects to be discharged to:: Private residence Living Arrangements: Spouse/significant other Available Help at Discharge: Family;Personal care attendant Type of Home: House Home Access: Ramped entrance     Home Layout: One level Home Equipment: Environmental consultant - 2 wheels;Walker - 4 wheels;Bedside commode;Wheelchair - manual;Hospital bed;Shower seat Additional Comments: On 3L O2 at home    Prior Function Level of Independence: Needs assistance   Gait / Transfers Assistance Needed: Uses RW for ambulation within house and close supervision; w/c for community distances. Falls.  ADL's / Homemaking Assistance Needed: Caregiver assists with ADL's; daughter/husband prepare meals  Comments: Pt reports she has 24/7 S between 4 people.     Hand Dominance   Dominant Hand: Right    Extremity/Trunk Assessment   Upper Extremity Assessment: Defer to OT evaluation           Lower Extremity Assessment: Generalized weakness         Communication   Communication: No difficulties  Cognition Arousal/Alertness:  Awake/alert Behavior During Therapy: WFL for tasks assessed/performed Overall Cognitive Status: Within Functional Limits for tasks assessed                      General Comments General comments (skin integrity, edema, etc.): Pt reports having lots of family support at home and was getting HHPT and [place] RN prior to admission.    Exercises         Assessment/Plan    PT Assessment Patient needs continued PT services  PT Diagnosis Generalized weakness;Difficulty walking   PT Problem List Decreased strength;Cardiopulmonary status limiting activity;Decreased activity tolerance;Decreased balance;Decreased mobility  PT Treatment Interventions Balance training;Gait training;Functional mobility training;Therapeutic exercise;Therapeutic activities;Patient/family education   PT Goals (Current goals can be found in the Care Plan section) Acute Rehab PT Goals Patient Stated Goal: to be able to breathe PT Goal Formulation: With patient Time For Goal Achievement: 09/12/15 Potential to Achieve Goals: Good    Frequency Min 3X/week   Barriers to discharge        Co-evaluation               End of Session Equipment Utilized During Treatment: Oxygen Activity Tolerance: Patient limited by fatigue;Patient tolerated treatment well Patient left: Other (comment);with call bell/phone within reach (sitting on [facility] Nurse Communication: Mobility status;Other (comment) (Informed RN that pt wanted to sit on [facility] for awhile to try to have a BM,)         Time: 1427-1440 PT Time Calculation (min) (ACUTE ONLY): 13 min   Charges:   PT Evaluation $PT Eval Low Complexity: 1 Procedure     PT G Codes:        Luvina Poirier A Lilliam [place] 08/29/2015, 3:32 PM  Wray Kearns, [place], DPT [phone]

## 2015-08-30 DIAGNOSIS — R040 Epistaxis: Secondary | ICD-10-CM | POA: Diagnosis present

## 2015-08-30 DIAGNOSIS — D500 Iron deficiency anemia secondary to blood loss (chronic): Secondary | ICD-10-CM

## 2015-08-30 DIAGNOSIS — D696 Thrombocytopenia, unspecified: Secondary | ICD-10-CM | POA: Diagnosis present

## 2015-08-30 DIAGNOSIS — I5032 Chronic diastolic (congestive) heart failure: Secondary | ICD-10-CM

## 2015-08-30 LAB — CBC
HCT: 23.3 % — ABNORMAL LOW (ref 36.0–46.0)
HEMOGLOBIN: 7.4 g/dL — AB (ref 12.0–15.0)
MCH: 33.8 pg (ref 26.0–34.0)
MCHC: 31.8 g/dL (ref 30.0–36.0)
MCV: 106.4 fL — ABNORMAL HIGH (ref 78.0–100.0)
Platelets: 68 10*3/uL — ABNORMAL LOW (ref 150–400)
RBC: 2.19 MIL/uL — ABNORMAL LOW (ref 3.87–5.11)
RDW: 18.1 % — AB (ref 11.5–15.5)
WBC: 4.7 10*3/uL (ref 4.0–10.5)

## 2015-08-30 LAB — BASIC METABOLIC PANEL
Anion gap: 7 (ref 5–15)
BUN: 49 mg/dL — AB (ref 6–20)
CALCIUM: 9.1 mg/dL (ref 8.9–10.3)
CHLORIDE: 102 mmol/L (ref 101–111)
CO2: 30 mmol/L (ref 22–32)
CREATININE: 1.56 mg/dL — AB (ref 0.44–1.00)
GFR calc Af Amer: 37 mL/min — ABNORMAL LOW (ref >60)
GFR calc non Af Amer: 32 mL/min — ABNORMAL LOW (ref >60)
Glucose, Bld: 380 mg/dL — ABNORMAL HIGH (ref 65–99)
Potassium: 4.4 mmol/L (ref 3.5–5.1)
SODIUM: 139 mmol/L (ref 135–145)

## 2015-08-30 LAB — GLUCOSE, CAPILLARY
GLUCOSE-CAPILLARY: 329 mg/dL — AB (ref 65–99)
GLUCOSE-CAPILLARY: 377 mg/dL — AB (ref 65–99)
Glucose-Capillary: 378 mg/dL — ABNORMAL HIGH (ref 65–99)
Glucose-Capillary: 277 mg/dL — ABNORMAL HIGH (ref 65–99)

## 2015-08-30 MED ORDER — PREDNISONE 20 MG PO TABS
60.0000 mg | ORAL_TABLET | Freq: Two times a day (BID) | ORAL | Status: DC
  Administered 2015-08-30 – 2015-08-31 (×2): 60 mg via ORAL
  Filled 2015-08-30: qty 6
  Filled 2015-08-30: qty 3

## 2015-08-30 MED ORDER — INSULIN GLARGINE 100 UNIT/ML ~~LOC~~ SOLN
70.0000 [IU] | Freq: Two times a day (BID) | SUBCUTANEOUS | Status: DC
  Administered 2015-08-31: 70 [IU] via SUBCUTANEOUS
  Filled 2015-08-30 (×2): qty 0.7

## 2015-08-30 MED ORDER — METOLAZONE 5 MG PO TABS
5.0000 mg | ORAL_TABLET | Freq: Every day | ORAL | Status: DC
  Administered 2015-08-31: 5 mg via ORAL
  Filled 2015-08-30: qty 1

## 2015-08-30 MED ORDER — FUROSEMIDE 10 MG/ML IJ SOLN
80.0000 mg | Freq: Three times a day (TID) | INTRAMUSCULAR | Status: DC
  Administered 2015-08-30 – 2015-09-01 (×6): 80 mg via INTRAVENOUS
  Filled 2015-08-30 (×6): qty 8

## 2015-08-30 MED ORDER — INSULIN GLARGINE 100 UNIT/ML ~~LOC~~ SOLN: 140.0000 [IU] | Freq: Every day | SUBCUTANEOUS | Status: DC

## 2015-08-30 MED ORDER — INSULIN ASPART 100 UNIT/ML ~~LOC~~ SOLN
20.0000 [IU] | Freq: Three times a day (TID) | SUBCUTANEOUS | Status: DC
  Administered 2015-08-30 – 2015-08-31 (×3): 20 [IU] via SUBCUTANEOUS

## 2015-08-30 NOTE — Progress Notes (Signed)
PROGRESS NOTE  [name] [id number] DOB: 03-Sep-1940 DOA: 08/28/2015 PCP: Ria Bush, MD  Assessment/Plan: Acute on chronic respiratory failure likely related to COPD exacerbation in the setting of acute on chronic DHF. Chest x-ray with vascular congestion concerning for pulmonary edema. BNP 956.3.  She requires 3 at home. Weight 240 lbs. Was about 225 a few months ago. Will increase lasix and add zaroxolyn. Change steroids to po. Continue nebs Repeat echo with diastolic dysfunction, moderate pulmonary hypertension and dilated IVC  Diabetes type 2, uncontrolled secondary to steroids hgb a1c 6.3. Will increase lantus and novolog. Taper steroids as able  Acute on chronic renal failure stage III. Creatinine somewhat monitor on diuretics   Anemia. History of IDA due to chronic blood loss. Repeat anemia panel. macrocytic   thrompocytopenia. 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   Chronic pain/fibromyalgia/restless leg -Stable at baseline -Continue home meds   Obesity. BMI greater than 35  PT eval rec home PT.  Epistaxis: not on anticoag. Hold pressure, avoid "picking   Code Status: DNR Family Communication: patient and friends Disposition Plan: home when improved   Consultants:    Procedures:    HPI/Subjective: Still SOB. C/o epistaxis. Has episodes at home  Objective: Filed Vitals:   08/29/15 2003 08/30/15 0453  BP: 151/45 145/55  Pulse: 46 43  Temp: 98.4 F (36.9 C) 98.1 F (36.7 C)  Resp: 18 18    Intake/Output Summary (Last 24 hours) at 08/30/15 1303 Last data filed at 08/30/15 1030  Gross per 24 hour  Intake    480 ml  Output   3050 ml  Net  -2570 ml   Filed Weights   08/29/15 0520 08/30/15 0453  Weight: 107.004 kg (235 lb 14.4 oz) 108.863 kg (240 lb)    Exam:   General:  Awake, digging in both nares with Q-tip, tissue, fingers  Cardiovascular: rrr  Respiratory: diminished throughout. Mild  wheeze  Abdomen: +BS, soft  Musculoskeletal: + edema   Data Reviewed: Basic Metabolic Panel:  Recent Labs Lab 08/28/15 0521 08/29/15 0307 08/30/15 0423  NA 143 141 139  K 4.6 4.5 4.4  CL 106 104 102  CO2 28 29 30   GLUCOSE 170* 383* 380*  BUN 46* 45* 49*  CREATININE 1.63* 1.43* 1.56*  CALCIUM 8.8* 8.8* 9.1   Liver Function Tests:  Recent Labs Lab 08/28/15 0521 08/29/15 0307  AST 36 30  ALT 26 22  ALKPHOS 103 101  BILITOT 1.2 1.0  PROT 6.0* 5.7*  ALBUMIN 3.2* 2.9*   No results for input(s): LIPASE, AMYLASE in the last 168 hours. No results for input(s): AMMONIA in the last 168 hours. CBC:  Recent Labs Lab 08/28/15 0521 08/29/15 0307 08/30/15 0423  WBC 5.0 3.1* 4.7  NEUTROABS 3.8  --   --   HGB 7.9* 7.1* 7.4*  HCT 25.3* 23.2* 23.3*  MCV 106.8* 106.4* 106.4*  PLT 104* 76* 68*   Cardiac Enzymes: No results for input(s): CKTOTAL, CKMB, CKMBINDEX, TROPONINI in the last 168 hours. BNP (last 3 results)  Recent Labs  02/23/15 1556 02/24/15 0813 08/28/15 0524  BNP 226.5* 486.8* 956.3*    ProBNP (last 3 results) No results for input(s): PROBNP in the last 8760 hours.  CBG:  Recent Labs Lab 08/29/15 1348 08/29/15 1649 08/29/15 2102 08/30/15 0617 08/30/15 1117  GLUCAP 458* 392* 386* 329* 378*    No results found for this or any previous visit (from the past 240 hour(s)).   Studies:  No results found.  Echo Left ventricle: The cavity size was normal. There was mild concentric hypertrophy. Systolic function was normal. The estimated ejection fraction was in the range of 55% to 60%. Wall motion was normal; there were no regional wall motion abnormalities. Features are consistent with a pseudonormal left ventricular filling pattern, with concomitant abnormal relaxation and increased filling pressure (grade 2 diastolic dysfunction). Doppler parameters are consistent with elevated ventricular end-diastolic filling pressure. -  Aortic valve: Trileaflet; moderately thickened, severely calcified leaflets. Valve mobility was restricted. There was mild stenosis. There was mild to moderate regurgitation. - Mitral valve: Calcified annulus. Moderately thickened, moderately calcified leaflets . The findings are consistent with mild stenosis. There was mild regurgitation. Valve area by continuity equation (using LVOT flow): 1.95 cm^2. - Left atrium: The atrium was moderately dilated. - Right atrium: The atrium was mildly dilated. - Tricuspid valve: There was moderate regurgitation. - Pulmonary arteries: Systolic pressure was moderately increased. PA peak pressure: 51 mm Hg (S). - Inferior vena cava: The vessel was dilated. The respirophasic diameter changes were blunted (< 50%), consistent with elevated central venous pressure. - Pericardium, extracardiac: There was no pericardial effusion.  Scheduled Meds: . ALPRAZolam  1 mg Oral BID  . buPROPion  150 mg Oral Daily  . carvedilol  1.56 mg Oral Daily  . darbepoetin (ARANESP) injection - NON-DIALYSIS  200 mcg Subcutaneous Q14 Days  . ferrous sulfate  325 mg Oral Q breakfast  . furosemide  60 mg Intravenous BID  . guaiFENesin  600 mg Oral BID  . insulin aspart  0-20 Units Subcutaneous TID WC  . insulin aspart  0-5 Units Subcutaneous QHS  . insulin aspart  10 Units Subcutaneous TID WC  . insulin glargine  120 Units Subcutaneous Q breakfast  . ipratropium-albuterol  3 mL Nebulization Q6H  . levofloxacin (LEVAQUIN) IV  750 mg Intravenous Q48H  . loratadine  10 mg Oral Daily  . methylPREDNISolone (SOLU-MEDROL) injection  80 mg Intravenous Q6H  . pantoprazole  40 mg Oral BID  . QUEtiapine  200 mg Oral QHS  . rifaximin  550 mg Oral BID  . rOPINIRole  4 mg Oral Daily  . simvastatin  40 mg Oral Daily  . sodium chloride  3 mL Intravenous Q12H  . sodium chloride  3 mL Intravenous Q12H  . venlafaxine XR  37.5 mg Oral Q breakfast   Continuous Infusions:    Antibiotics Given (last 72 hours)    Date/Time Action Medication Dose Rate   08/28/15 1545 Given   levofloxacin (LEVAQUIN) IVPB 750 mg 750 mg 100 mL/hr   08/28/15 2141 Given   rifaximin (XIFAXAN) tablet 550 mg 550 mg    08/29/15 1032 Given   rifaximin (XIFAXAN) tablet 550 mg 550 mg    08/29/15 2148 Given   rifaximin (XIFAXAN) tablet 550 mg 550 mg    08/30/15 1019 Given   rifaximin (XIFAXAN) tablet 550 mg 550 mg    08/30/15 1020 Given   levofloxacin (LEVAQUIN) IVPB 750 mg 750 mg 100 mL/hr       Time spent: 25 min    [place] [facility] [email or web address], password [facility] 08/30/2015, 1:03 PM  LOS: 2 days

## 2015-08-30 NOTE — Progress Notes (Signed)
Inpatient Diabetes Program Recommendations  AACE/ADA: New Consensus Statement on Inpatient Glycemic Control (2015)  Target Ranges:  Prepandial:   less than 140 mg/dL      Peak postprandial:   less than 180 mg/dL (1-2 hours)      Critically ill patients:  140 - 180 mg/dL   Results for [name], [name] (MRN [id number]) as of 08/30/2015 09:51  Ref. Range 08/29/2015 06:28 08/29/2015 11:42 08/29/2015 13:48 08/29/2015 16:49 08/29/2015 21:02 08/30/2015 06:17  Glucose-Capillary Latest Ref Range: 65-99 mg/dL 391 (H) 491 (H) 458 (H) 392 (H) 386 (H) 329 (H)   Review of Glycemic Control  Diabetes history: DM 2 Outpatient Diabetes medications: Lantus 120 units, Humalog 10 units Daily at supper Current orders for Inpatient glycemic control: Lantus 120, Novolog Resistant, Novolog HS scale, Novolog 10 units TID meal coverage  Inpatient Diabetes Program Recommendations: Correction (SSI): Glucose in the 300's still. Patient still on IV Solumedrol 80 mg Q6hrs. Please adjust Resistant Correction to Q4hrs. Insulin - Meal Coverage: Consider increasing meal coverage to 15 units TID in addition to correction scale.  Thanks,  Tama Headings RN, MSN, [facility] Inpatient Diabetes Coordinator Team Pager [phone] (8a-5p)

## 2015-08-30 NOTE — Care Management Note (Addendum)
Case Management Note  Patient Details  Name: [name] MRN: [id number] Date of Birth: 1940/12/15  Subjective/Objective:         Pt admitted with acute on chronic respiratory failure           Action/Plan:  Pt is from home with husband. Pt has the following equipment already at home ;Gilford Rile - 4 wheels;Bedside commode;Wheelchair - manual;Hospital bed;Shower seat.   Pt is active with [facility] for [facility].  CM contacted agency to verify services provided.  Pt is on home O2 3Liters. CM will continue to monitor for disposition needs   Expected Discharge Date:                  Expected Discharge Plan:  [place]  In-House Referral:     Discharge planning Services  CM Consult  Post Acute Care Choice:  Resumption of Svcs/PTA Provider (Pt is already active with [place]) Choice offered to:  Patient  DME Arranged:    DME Agency:     HH Arranged:    [place]:  [place]  Status of Service:  In process, will continue to follow  Medicare Important Message Given:    Date Medicare IM Given:    Medicare IM give by:    Date Additional Medicare IM Given:    Additional Medicare Important Message give by:     If discussed at [place] of Stay Meetings, dates discussed:    Additional Comments:  Maryclare Labrador, RN 08/30/2015, 2:49 PM

## 2015-08-31 LAB — BASIC METABOLIC PANEL
Anion gap: 9 (ref 5–15)
BUN: 51 mg/dL — AB (ref 6–20)
CALCIUM: 9.1 mg/dL (ref 8.9–10.3)
CHLORIDE: 99 mmol/L — AB (ref 101–111)
CO2: 31 mmol/L (ref 22–32)
CREATININE: 1.37 mg/dL — AB (ref 0.44–1.00)
GFR calc non Af Amer: 38 mL/min — ABNORMAL LOW (ref >60)
GFR, EST AFRICAN AMERICAN: 43 mL/min — AB (ref >60)
Glucose, Bld: 456 mg/dL — ABNORMAL HIGH (ref 65–99)
Potassium: 4.1 mmol/L (ref 3.5–5.1)
Sodium: 139 mmol/L (ref 135–145)

## 2015-08-31 LAB — CBC
HCT: 23.2 % — ABNORMAL LOW (ref 36.0–46.0)
Hemoglobin: 7.3 g/dL — ABNORMAL LOW (ref 12.0–15.0)
MCH: 33.6 pg (ref 26.0–34.0)
MCHC: 31.5 g/dL (ref 30.0–36.0)
MCV: 106.9 fL — AB (ref 78.0–100.0)
PLATELETS: 64 10*3/uL — AB (ref 150–400)
RBC: 2.17 MIL/uL — AB (ref 3.87–5.11)
RDW: 17.6 % — AB (ref 11.5–15.5)
WBC: 4.2 10*3/uL (ref 4.0–10.5)

## 2015-08-31 LAB — GLUCOSE, CAPILLARY
GLUCOSE-CAPILLARY: 361 mg/dL — AB (ref 65–99)
GLUCOSE-CAPILLARY: 330 mg/dL — AB (ref 65–99)
GLUCOSE-CAPILLARY: 254 mg/dL — AB (ref 65–99)
Glucose-Capillary: 242 mg/dL — ABNORMAL HIGH (ref 65–99)

## 2015-08-31 LAB — FOLATE: Folate: 45.4 ng/mL (ref >5.9)

## 2015-08-31 LAB — IRON AND TIBC
Iron: 53 ug/dL (ref 28–170)
Saturation Ratios: 16 % (ref 10.4–31.8)
TIBC: 323 ug/dL (ref 250–450)
UIBC: 270 ug/dL

## 2015-08-31 LAB — FERRITIN: FERRITIN: 74 ng/mL (ref 11–307)

## 2015-08-31 LAB — VITAMIN B12: VITAMIN B 12: 2206 pg/mL — AB (ref 180–914)

## 2015-08-31 MED ORDER — PREDNISONE 20 MG PO TABS
40.0000 mg | ORAL_TABLET | Freq: Every day | ORAL | Status: DC
  Administered 2015-09-01: 40 mg via ORAL
  Filled 2015-08-31 (×2): qty 2

## 2015-08-31 MED ORDER — INSULIN ASPART 100 UNIT/ML ~~LOC~~ SOLN
30.0000 [IU] | Freq: Three times a day (TID) | SUBCUTANEOUS | Status: DC
  Administered 2015-08-31: 30 [IU] via SUBCUTANEOUS

## 2015-08-31 MED ORDER — INSULIN GLARGINE 100 UNIT/ML ~~LOC~~ SOLN
80.0000 [IU] | Freq: Two times a day (BID) | SUBCUTANEOUS | Status: DC
  Administered 2015-08-31: 80 [IU] via SUBCUTANEOUS
  Filled 2015-08-31 (×2): qty 0.8

## 2015-08-31 MED ORDER — METOLAZONE 2.5 MG PO TABS: 2.5000 mg | ORAL_TABLET | Freq: Every day | ORAL | Status: DC

## 2015-08-31 NOTE — Care Management Note (Signed)
Case Management Note  Patient Details  Name: [name] MRN: [id number] Date of Birth: 1941/08/04  Subjective/Objective:         Pt admitted with acute on chronic respiratory failure           Action/Plan:  Pt is from home with husband. Pt has the following equipment already at home ;Gilford Rile - 4 wheels;Bedside commode;Wheelchair - manual;Hospital bed;Shower seat.   Pt is active with [facility] for [facility].  CM contacted agency to verify services provided.  Pt is on home O2 3Liters. CM will continue to monitor for disposition needs   Expected Discharge Date:                  Expected Discharge Plan:  [place]  In-House Referral:     Discharge planning Services  CM Consult  Post Acute Care Choice:  Resumption of Svcs/PTA Provider (Pt is already active with [place]) Choice offered to:  Patient  DME Arranged:    DME Agency:     HH Arranged:    [place]:  [place]  Status of Service:  In process, will continue to follow  Medicare Important Message Given:    Date Medicare IM Given:    Medicare IM give by:    Date Additional Medicare IM Given:    Additional Medicare Important Message give by:     If discussed at [place] of Stay Meetings, dates discussed:    Additional Comments: 08/31/2015 CM placed physician sticky note requesting resumption orders for HHPT, agency is aware of admit Maryclare Labrador, RN 08/31/2015, 10:44 AM

## 2015-08-31 NOTE — Care Management Important Message (Signed)
Important Message  Patient Details  Name: [name] MRN: [id number] Date of Birth: 1941/01/28   Medicare Important Message Given:  Yes    [name] 08/31/2015, 2:23 PM

## 2015-08-31 NOTE — Progress Notes (Addendum)
Physical Therapy Treatment Patient Details Name: [name] MRN: [id number] DOB: 11-19-1940 Today's Date: 08/31/2015    History of Present Illness Patient is a 75 y/o female withhx of COPD, HTN, CAD, CHF, DM, asthma, depression with psychotic behavior, RLS, NASH and anemia presents with Acute on chronic respiratory failure likely related to COPD exacerbation in the setting of CHF exacerbation. CXR-vascular congestion concerning for pulmonary edema.    PT Comments    Patient progressing well towards PT goals. Tolerated gait training today with Min guard assist for safety. Pt with dyspnea on exertion requiring seated rest breaks due to fatigue. Pt motivated to improve function. Encouraged ambulation to bathroom daily to improve strength, endurance and mobilty while in hospital and sitting in chair as much as tolerated. Will plan for stair training next session as tolerated. Will continue to follow.    Follow Up Recommendations  Home health PT;Supervision for mobility/OOB     Equipment Recommendations  None recommended by PT    Recommendations for Other Services       Precautions / Restrictions Precautions Precautions: Fall Restrictions Weight Bearing Restrictions: No    Mobility  Bed Mobility Overal bed mobility: Needs Assistance Bed Mobility: Supine to Sit     Supine to sit: Min assist;HOB elevated     General bed mobility comments: Assist to elevate trunk.   Transfers Overall transfer level: Needs assistance Equipment used: Rolling walker (2 wheeled) Transfers: Sit to/from Stand Sit to Stand: Min guard Stand pivot transfers: Min guard       General transfer comment: Min guard to boost from EOB x2. SPT from bed to chair post ambulation bout.   Ambulation/Gait Ambulation/Gait assistance: Min guard Ambulation Distance (Feet): 40 Feet Assistive device: Rolling walker (2 wheeled) Gait Pattern/deviations: Step-through pattern;Decreased stride length;Wide base of  support Gait velocity: decreased Gait velocity interpretation: <1.8 ft/sec, indicative of risk for recurrent falls General Gait Details: Slow, mostly steady gait. 2-3/4 DOE. Vitals stable. Ambulated within room. Required longer seated rest break prior to transfer to chair due to SOB.   Stairs            Wheelchair Mobility    Modified Rankin (Stroke Patients Only)       Balance Overall balance assessment: Needs assistance Sitting-balance support: Feet supported;No upper extremity supported Sitting balance-Leahy Scale: Fair     Standing balance support: During functional activity Standing balance-Leahy Scale: Poor Standing balance comment: Requires UE support in standing.                    Cognition Arousal/Alertness: Awake/alert Behavior During Therapy: WFL for tasks assessed/performed Overall Cognitive Status: Within Functional Limits for tasks assessed                      Exercises General Exercises - Lower Extremity Ankle Circles/Pumps: Both;10 reps;Supine Long Arc Quad: Both;10 reps;Seated    General Comments General comments (skin integrity, edema, etc.): Pt's neighbor/caregiver present in room during session.      Pertinent Vitals/Pain Pain Assessment: No/denies pain    Home Living                      Prior Function            PT Goals (current goals can now be found in the care plan section) Progress towards PT goals: Progressing toward goals    Frequency  Min 3X/week    PT Plan Current plan remains appropriate  Co-evaluation             End of Session Equipment Utilized During Treatment: Oxygen;Gait belt Activity Tolerance: Patient tolerated treatment well;Patient limited by fatigue Patient left: in chair;with call bell/phone within reach;with family/visitor present     Time: [id number] PT Time Calculation (min) (ACUTE ONLY): 23 min  Charges:  $Gait Training: 8-22 mins $Therapeutic Activity: 8-22  mins                    G Codes:      [name] 08/31/2015, 10:37 AM Wray Kearns, [place], DPT [phone]

## 2015-08-31 NOTE — Progress Notes (Signed)
PROGRESS NOTE  [name] [id number] DOB: 28-Oct-1940 DOA: 08/28/2015 PCP: Ria Bush, MD  Assessment/Plan: Acute on chronic respiratory failure likely related to COPD exacerbation in the setting of acute on chronic DHF. Chest x-ray with vascular congestion concerning for pulmonary edema. BNP 956.3.  She requires 3 at home. Weight 240 lbs. Was about 225 a few months ago. 8 L negative but weight recorded as increasing. Suspect erroneous. Asked nursing staff to reweigh. Continue current diuretics. Taper steroids. Continue nebs. Repeat echo with diastolic dysfunction, moderate pulmonary hypertension and dilated IVC  Diabetes type 2, uncontrolled secondary to steroids hgb a1c 6.3. Still high. Increase insulin. Taper steroids.  Acute on chronic renal failure stage III. Creatinine improving with diuresis.   Anemia. History of IDA due to chronic blood loss. Repeat anemia panel not very impressive.   thrompocytopenia. 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   Chronic pain/fibromyalgia/restless leg -Stable at baseline -Continue home meds   Obesity. BMI greater than 35  PT eval rec home PT.  Epistaxis: Resolved   Code Status: DNR Family Communication: patient and friends Disposition Plan: home with home PT when improved   Consultants:    Procedures:    HPI/Subjective: Breathing easier. Has been out of bed all day. Epistaxis resolved.  Objective: Filed Vitals:   08/30/15 2017 08/31/15 0534  BP: 148/40 162/44  Pulse: 48 43  Temp: 98.6 F (37 C) 97.6 F (36.4 C)  Resp: 18 17    Intake/Output Summary (Last 24 hours) at 08/31/15 1323 Last data filed at 08/31/15 0850  Gross per 24 hour  Intake    100 ml  Output   3350 ml  Net  -3250 ml   Filed Weights   08/29/15 0520 08/30/15 0453 08/31/15 0534  Weight: 107.004 kg (235 lb 14.4 oz) 108.863 kg (240 lb) 110.36 kg (243 lb 4.8 oz)    Exam:   General:  Awake, in chair.  Able to speak in full sentences.  HEENT: Nares without blood  Cardiovascular: rrr without murmurs gallops rubs  Respiratory: diminished throughout. No wheeze rhonchi or rales  Abdomen: +BS, soft  Musculoskeletal: Peripheral edema improving.  Data Reviewed: Basic Metabolic Panel:  Recent Labs Lab 08/28/15 0521 08/29/15 0307 08/30/15 0423 08/31/15 0406  NA 143 141 139 139  K 4.6 4.5 4.4 4.1  CL 106 104 102 99*  CO2 28 29 30 31   GLUCOSE 170* 383* 380* 456*  BUN 46* 45* 49* 51*  CREATININE 1.63* 1.43* 1.56* 1.37*  CALCIUM 8.8* 8.8* 9.1 9.1   Liver Function Tests:  Recent Labs Lab 08/28/15 0521 08/29/15 0307  AST 36 30  ALT 26 22  ALKPHOS 103 101  BILITOT 1.2 1.0  PROT 6.0* 5.7*  ALBUMIN 3.2* 2.9*   No results for input(s): LIPASE, AMYLASE in the last 168 hours. No results for input(s): AMMONIA in the last 168 hours. CBC:  Recent Labs Lab 08/28/15 0521 08/29/15 0307 08/30/15 0423 08/31/15 0406  WBC 5.0 3.1* 4.7 4.2  NEUTROABS 3.8  --   --   --   HGB 7.9* 7.1* 7.4* 7.3*  HCT 25.3* 23.2* 23.3* 23.2*  MCV 106.8* 106.4* 106.4* 106.9*  PLT 104* 76* 68* 64*   Cardiac Enzymes: No results for input(s): CKTOTAL, CKMB, CKMBINDEX, TROPONINI in the last 168 hours. BNP (last 3 results)  Recent Labs  02/23/15 1556 02/24/15 0813 08/28/15 0524  BNP 226.5* 486.8* 956.3*    ProBNP (last 3 results) No results for input(s): PROBNP  in the last 8760 hours.  CBG:  Recent Labs Lab 08/30/15 1117 08/30/15 1614 08/30/15 2104 08/31/15 0631 08/31/15 1033  GLUCAP 378* 277* 377* 361* 330*    No results found for this or any previous visit (from the past 240 hour(s)).   Studies: No results found.  Echo Left ventricle: The cavity size was normal. There was mild concentric hypertrophy. Systolic function was normal. The estimated ejection fraction was in the range of 55% to 60%. Wall motion was normal; there were no regional wall motion abnormalities.  Features are consistent with a pseudonormal left ventricular filling pattern, with concomitant abnormal relaxation and increased filling pressure (grade 2 diastolic dysfunction). Doppler parameters are consistent with elevated ventricular end-diastolic filling pressure. - Aortic valve: Trileaflet; moderately thickened, severely calcified leaflets. Valve mobility was restricted. There was mild stenosis. There was mild to moderate regurgitation. - Mitral valve: Calcified annulus. Moderately thickened, moderately calcified leaflets . The findings are consistent with mild stenosis. There was mild regurgitation. Valve area by continuity equation (using LVOT flow): 1.95 cm^2. - Left atrium: The atrium was moderately dilated. - Right atrium: The atrium was mildly dilated. - Tricuspid valve: There was moderate regurgitation. - Pulmonary arteries: Systolic pressure was moderately increased. PA peak pressure: 51 mm Hg (S). - Inferior vena cava: The vessel was dilated. The respirophasic diameter changes were blunted (< 50%), consistent with elevated central venous pressure. - Pericardium, extracardiac: There was no pericardial effusion.  Scheduled Meds: . ALPRAZolam  1 mg Oral BID  . buPROPion  150 mg Oral Daily  . carvedilol  1.56 mg Oral Daily  . darbepoetin (ARANESP) injection - NON-DIALYSIS  200 mcg Subcutaneous Q14 Days  . ferrous sulfate  325 mg Oral Q breakfast  . furosemide  80 mg Intravenous 3 times per day  . guaiFENesin  600 mg Oral BID  . insulin aspart  0-20 Units Subcutaneous TID WC  . insulin aspart  0-5 Units Subcutaneous QHS  . insulin aspart  20 Units Subcutaneous TID WC  . insulin glargine  70 Units Subcutaneous Q12H  . ipratropium-albuterol  3 mL Nebulization Q6H  . levofloxacin (LEVAQUIN) IV  750 mg Intravenous Q48H  . loratadine  10 mg Oral Daily  . metolazone  5 mg Oral Daily  . pantoprazole  40 mg Oral BID  . predniSONE  60 mg Oral BID WC    . QUEtiapine  200 mg Oral QHS  . rifaximin  550 mg Oral BID  . rOPINIRole  4 mg Oral Daily  . simvastatin  40 mg Oral Daily  . sodium chloride  3 mL Intravenous Q12H  . sodium chloride  3 mL Intravenous Q12H  . venlafaxine XR  37.5 mg Oral Q breakfast   Continuous Infusions:  Antibiotics Given (last 72 hours)    Date/Time Action Medication Dose Rate   08/28/15 1545 Given   levofloxacin (LEVAQUIN) IVPB 750 mg 750 mg 100 mL/hr   08/28/15 2141 Given   rifaximin (XIFAXAN) tablet 550 mg 550 mg    08/29/15 1032 Given   rifaximin (XIFAXAN) tablet 550 mg 550 mg    08/29/15 2148 Given   rifaximin (XIFAXAN) tablet 550 mg 550 mg    08/30/15 1019 Given   rifaximin (XIFAXAN) tablet 550 mg 550 mg    08/30/15 1020 Given   levofloxacin (LEVAQUIN) IVPB 750 mg 750 mg 100 mL/hr   08/30/15 2200 Given   rifaximin (XIFAXAN) tablet 550 mg 550 mg    08/31/15 1140 Given   rifaximin (  XIFAXAN) tablet 550 mg 550 mg        Time spent: 25 min    [facility] [email or web address], password [facility] 08/31/2015, 1:23 PM  LOS: 3 days

## 2015-09-01 DIAGNOSIS — J9621 Acute and chronic respiratory failure with hypoxia: Secondary | ICD-10-CM

## 2015-09-01 LAB — CBC
HEMATOCRIT: 26.2 % — AB (ref 36.0–46.0)
HEMOGLOBIN: 8.2 g/dL — AB (ref 12.0–15.0)
MCH: 32.8 pg (ref 26.0–34.0)
MCHC: 31.3 g/dL (ref 30.0–36.0)
MCV: 104.8 fL — AB (ref 78.0–100.0)
Platelets: 74 10*3/uL — ABNORMAL LOW (ref 150–400)
RBC: 2.5 MIL/uL — AB (ref 3.87–5.11)
RDW: 17 % — ABNORMAL HIGH (ref 11.5–15.5)
WBC: 4.3 10*3/uL (ref 4.0–10.5)

## 2015-09-01 LAB — BASIC METABOLIC PANEL
ANION GAP: 10 (ref 5–15)
BUN: 50 mg/dL — ABNORMAL HIGH (ref 6–20)
CHLORIDE: 95 mmol/L — AB (ref 101–111)
CO2: 36 mmol/L — AB (ref 22–32)
CREATININE: 1.43 mg/dL — AB (ref 0.44–1.00)
Calcium: 9.6 mg/dL (ref 8.9–10.3)
GFR calc non Af Amer: 36 mL/min — ABNORMAL LOW (ref >60)
GFR, EST AFRICAN AMERICAN: 41 mL/min — AB (ref >60)
Glucose, Bld: 94 mg/dL (ref 65–99)
Potassium: 3 mmol/L — ABNORMAL LOW (ref 3.5–5.1)
Sodium: 141 mmol/L (ref 135–145)

## 2015-09-01 LAB — MAGNESIUM: MAGNESIUM: 2.2 mg/dL (ref 1.7–2.4)

## 2015-09-01 LAB — GLUCOSE, CAPILLARY
GLUCOSE-CAPILLARY: 384 mg/dL — AB (ref 65–99)
Glucose-Capillary: 59 mg/dL — ABNORMAL LOW (ref 65–99)
Glucose-Capillary: 88 mg/dL (ref 65–99)

## 2015-09-01 LAB — BRAIN NATRIURETIC PEPTIDE: B Natriuretic Peptide: 645.3 pg/mL — ABNORMAL HIGH (ref 0.0–100.0)

## 2015-09-01 MED ORDER — LEVOFLOXACIN 750 MG PO TABS
750.0000 mg | ORAL_TABLET | ORAL | Status: DC
  Administered 2015-09-01: 750 mg via ORAL
  Filled 2015-09-01: qty 1

## 2015-09-01 MED ORDER — INSULIN GLARGINE 100 UNIT/ML ~~LOC~~ SOLN
60.0000 [IU] | Freq: Two times a day (BID) | SUBCUTANEOUS | Status: DC
  Filled 2015-09-01: qty 0.6

## 2015-09-01 MED ORDER — IPRATROPIUM-ALBUTEROL 0.5-2.5 (3) MG/3ML IN SOLN
3.0000 mL | Freq: Three times a day (TID) | RESPIRATORY_TRACT | Status: DC
  Administered 2015-09-01: 3 mL via RESPIRATORY_TRACT
  Filled 2015-09-01: qty 3

## 2015-09-01 MED ORDER — FUROSEMIDE 80 MG PO TABS
80.0000 mg | ORAL_TABLET | Freq: Two times a day (BID) | ORAL | Status: DC
  Administered 2015-09-01: 80 mg via ORAL
  Filled 2015-09-01: qty 1

## 2015-09-01 MED ORDER — INSULIN ASPART 100 UNIT/ML ~~LOC~~ SOLN
10.0000 [IU] | Freq: Three times a day (TID) | SUBCUTANEOUS | Status: DC
  Administered 2015-09-01: 10 [IU] via SUBCUTANEOUS

## 2015-09-01 MED ORDER — FUROSEMIDE 80 MG PO TABS
80.0000 mg | ORAL_TABLET | Freq: Two times a day (BID) | ORAL | Status: DC
Start: 2015-09-01 — End: 2015-09-13

## 2015-09-01 MED ORDER — POTASSIUM CHLORIDE CRYS ER 20 MEQ PO TBCR
40.0000 meq | EXTENDED_RELEASE_TABLET | ORAL | Status: AC
  Administered 2015-09-01 (×3): 40 meq via ORAL
  Filled 2015-09-01 (×3): qty 2

## 2015-09-01 NOTE — Care Management Note (Addendum)
Case Management Note  Patient Details  Name: [name] MRN: [id number] Date of Birth: 27-Jun-1941  Subjective/Objective:         Pt admitted with acute on chronic respiratory failure           Action/Plan:  Pt is from home with husband. Pt has the following equipment already at home ;Gilford Rile - 4 wheels;Bedside commode;Wheelchair - manual;Hospital bed;Shower seat.   Pt is active with [facility].  CM contacted agency to verify services provided.  Pt is on home O2 3Liters. CM will continue to monitor for disposition needs   Expected Discharge Date:                  Expected Discharge Plan:  [place]  In-House Referral:     Discharge planning Services  CM Consult  Post Acute Care Choice:  Resumption of Svcs/PTA Provider (Pt is already active with [facility]) Choice offered to:  Patient  DME Arranged:    DME Agency:     HH Arranged:  RN, PT HH Agency:  [place]  Status of Service:  Completed, signed off  Medicare Important Message Given:  Yes Date Medicare IM Given:    Medicare IM give by:    Date Additional Medicare IM Given:    Additional Medicare Important Message give by:     If discussed at [place] of Stay Meetings, dates discussed:    Additional Comments: 09/01/2015  Pt will discharge today with HH, CM contacted agency to inform of discharge.  Pt stated her husband will bring the portable oxygen tank from home, liter flow remains the same as prior to admit  CM placed physician sticky note requesting resumption orders for HHPT, agency is aware of admit Maryclare Labrador, RN 09/01/2015, 12:55 PM

## 2015-09-01 NOTE — Progress Notes (Signed)
Pt up to chair ready for discharge, unable to locate clothes.  She believes that her sister may have taken them home to be washed.  Pt supplied with paper gown to cover herself, she also has a Designer, fashion/clothing and slippers.  She feels comfortable being discharged wearing these items.  Discharge instructions reviewed with patient.  Pt expressed understanding to make f/u appt with PCP and also the importance of keeping other prescheduled appointments.  She expresses understanding of her medications to be taken at home.  She also states understanding of instructions specific to her health care needs.  Discharged off unit via w/c to home with spouse.  Edison Nasuti, RN
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
## 2015-09-01 NOTE — Progress Notes (Signed)
Hypoglycemic Event  CBG 59  Treatment: 15 GM carbohydrate snack  Symptoms: None  Follow-up CBG: [id number] CBG Result:88  Possible Reasons for Event: Medication regimen: insulin orders   Comments/MD notified    [name]

## 2015-09-01 NOTE — Progress Notes (Signed)
Physical Therapy Treatment Patient Details Name: [name] MRN: [id number] DOB: 16-Feb-1941 Today's Date: 09/01/2015    History of Present Illness Patient is a 75 y/o female withhx of COPD, HTN, CAD, CHF, DM, asthma, depression with psychotic behavior, RLS, NASH and anemia presents with Acute on chronic respiratory failure likely related to COPD exacerbation in the setting of CHF exacerbation. CXR-vascular congestion concerning for pulmonary edema.    PT Comments    Patient agreeable to ambulate in the room. Noted fatigue but was able to walk a little further this session. Caregiver present and feels very confident to care for her at home. Plan is to resume her PT once she is discharged.   Follow Up Recommendations  Home health PT;Supervision for mobility/OOB     Equipment Recommendations  None recommended by PT    Recommendations for Other Services       Precautions / Restrictions Precautions Precautions: Fall Restrictions Weight Bearing Restrictions: No    Mobility  Bed Mobility Overal bed mobility: Needs Assistance       Supine to sit: Supervision     General bed mobility comments: Use of rail but HOB flat  Transfers Overall transfer level: Needs assistance Equipment used: Rolling walker (2 wheeled)   Sit to Stand: Min guard         General transfer comment: MG for safety. Cues for safe hand placement.   Ambulation/Gait Ambulation/Gait assistance: Min guard Ambulation Distance (Feet): 65 Feet Assistive device: Rolling walker (2 wheeled)   Gait velocity: decreased   General Gait Details: Slow, mostly steady gait. 2-3/4 DOE.No seated rest break required. She has a safe use of RW   Stairs            Wheelchair Mobility    Modified Rankin (Stroke Patients Only)       Balance                                    Cognition Arousal/Alertness: Awake/alert Behavior During Therapy: WFL for tasks assessed/performed Overall  Cognitive Status: Within Functional Limits for tasks assessed                      Exercises General Exercises - Lower Extremity Long Arc Quad: Both;10 reps;Seated    General Comments        Pertinent Vitals/Pain Pain Assessment: No/denies pain    Home Living                      Prior Function            PT Goals (current goals can now be found in the care plan section) Progress towards PT goals: Progressing toward goals    Frequency  Min 3X/week    PT Plan Current plan remains appropriate    Co-evaluation             End of Session Equipment Utilized During Treatment: Oxygen;Gait belt Activity Tolerance: Patient tolerated treatment well Patient left: in chair     Time: 1050-1103 PT Time Calculation (min) (ACUTE ONLY): 13 min  Charges:  $Gait Training: 8-22 mins                    G Codes:      Jacqualyn Posey 09/01/2015, 11:10 AM  09/01/2015 Mattie Nordell, Tonia Brooms PTA

## 2015-09-04 ENCOUNTER — Encounter: Payer: Self-pay | Admitting: *Deleted

## 2015-09-04 ENCOUNTER — Telehealth: Payer: Self-pay | Admitting: Gastroenterology

## 2015-09-04 ENCOUNTER — Telehealth: Payer: Self-pay | Admitting: *Deleted

## 2015-09-04 ENCOUNTER — Other Ambulatory Visit: Payer: Self-pay | Admitting: *Deleted

## 2015-09-04 DIAGNOSIS — J449 Chronic obstructive pulmonary disease, unspecified: Secondary | ICD-10-CM | POA: Diagnosis not present

## 2015-09-04 NOTE — Telephone Encounter (Signed)
Transition Care Management Follow-up Telephone Call   Date discharged? 09/01/15   How have you been since you were released from the hospital? Weak, but improving   Do you understand why you were in the hospital? yes   Do you understand the discharge instructions? yes   Where were you discharged to? home   Items Reviewed:  Medications reviewed: yes  Allergies reviewed: yes  Dietary changes reviewed: no  Referrals reviewed: no   Functional Questionnaire:   Activities of Daily Living (ADLs):   She states they are independent in the following: bathing and hygiene, feeding, continence, grooming, toileting and dressing States they require assistance with the following: ambulation   Any transportation issues/concerns?: no   Any patient concerns? yes, low hemoglobin while inpatient   Confirmed importance and date/time of follow-up visits scheduled yes, 09/12/15 @ 1200  Provider Appointment booked with Ria Bush, MD  Confirmed with patient if condition begins to worsen call PCP or go to the ER.  Patient was given the office number and encouraged to call back with question or concerns.  : yes

## 2015-09-04 NOTE — Patient Outreach (Signed)
Transition of care done. I am well acquainted with Mrs. Dehnel, we have worked together for a couple of years. He also has had Home Health for PT and has had visits from [facility]. I will resume my services, hopefully on Thursday. Pt reminded to call me if any problems or call the 24 hour nurse line.  Deloria Lair [facility] [place] [phone]

## 2015-09-05 ENCOUNTER — Telehealth: Payer: Self-pay

## 2015-09-05 DIAGNOSIS — M6281 Muscle weakness (generalized): Secondary | ICD-10-CM | POA: Diagnosis not present

## 2015-09-05 DIAGNOSIS — E1122 Type 2 diabetes mellitus with diabetic chronic kidney disease: Secondary | ICD-10-CM | POA: Diagnosis not present

## 2015-09-05 DIAGNOSIS — I5032 Chronic diastolic (congestive) heart failure: Secondary | ICD-10-CM | POA: Diagnosis not present

## 2015-09-05 DIAGNOSIS — I130 Hypertensive heart and chronic kidney disease with heart failure and stage 1 through stage 4 chronic kidney disease, or unspecified chronic kidney disease: Secondary | ICD-10-CM | POA: Diagnosis not present

## 2015-09-05 NOTE — Telephone Encounter (Signed)
Danielle PT with [facility] left v/m requesting verbal orders for home health PT 2 x a week for 4 weeks and verbal orders for nursing and OT evaluation.

## 2015-09-05 NOTE — Telephone Encounter (Signed)
Spoke with patient and gave her [facility] hospital/GI dept number to call to reschedule her procedure.

## 2015-09-05 NOTE — Telephone Encounter (Signed)
May give verbal ok for this. Thanks.

## 2015-09-06 ENCOUNTER — Other Ambulatory Visit: Payer: Self-pay | Admitting: *Deleted

## 2015-09-06 ENCOUNTER — Encounter (HOSPITAL_COMMUNITY)
Admission: RE | Admit: 2015-09-06 | Discharge: 2015-09-06 | Disposition: A | Discharge status: Home / Self Care | Payer: Medicare Other | Source: Ambulatory Visit | Attending: Pulmonary Disease | Admitting: Pulmonary Disease

## 2015-09-06 DIAGNOSIS — Q2733 Arteriovenous malformation of digestive system vessel: Secondary | ICD-10-CM | POA: Diagnosis not present

## 2015-09-06 DIAGNOSIS — D649 Anemia, unspecified: Secondary | ICD-10-CM | POA: Insufficient documentation

## 2015-09-06 DIAGNOSIS — N289 Disorder of kidney and ureter, unspecified: Secondary | ICD-10-CM | POA: Insufficient documentation

## 2015-09-06 DIAGNOSIS — K7460 Unspecified cirrhosis of liver: Secondary | ICD-10-CM | POA: Insufficient documentation

## 2015-09-06 LAB — POCT HEMOGLOBIN-HEMACUE: Hemoglobin: 8.8 g/dL — ABNORMAL LOW (ref 12.0–15.0)

## 2015-09-06 MED ORDER — DARBEPOETIN ALFA 200 MCG/0.4ML IJ SOSY
200.0000 ug | PREFILLED_SYRINGE | INTRAMUSCULAR | Status: DC
  Administered 2015-09-06: 200 ug via SUBCUTANEOUS

## 2015-09-06 MED ORDER — DARBEPOETIN ALFA 200 MCG/0.4ML IJ SOSY
PREFILLED_SYRINGE | INTRAMUSCULAR | Status: AC
  Filled 2015-09-06: qty 0.4

## 2015-09-06 NOTE — Telephone Encounter (Signed)
Left detailed message on identified voice mail with verbal orders as requested.
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
## 2015-09-08 DIAGNOSIS — I5032 Chronic diastolic (congestive) heart failure: Secondary | ICD-10-CM | POA: Diagnosis not present

## 2015-09-08 DIAGNOSIS — M6281 Muscle weakness (generalized): Secondary | ICD-10-CM | POA: Diagnosis not present

## 2015-09-08 DIAGNOSIS — E1122 Type 2 diabetes mellitus with diabetic chronic kidney disease: Secondary | ICD-10-CM | POA: Diagnosis not present

## 2015-09-08 DIAGNOSIS — I130 Hypertensive heart and chronic kidney disease with heart failure and stage 1 through stage 4 chronic kidney disease, or unspecified chronic kidney disease: Secondary | ICD-10-CM | POA: Diagnosis not present

## 2015-09-08 DIAGNOSIS — J449 Chronic obstructive pulmonary disease, unspecified: Secondary | ICD-10-CM | POA: Diagnosis not present

## 2015-09-11 ENCOUNTER — Encounter: Payer: Self-pay | Admitting: *Deleted

## 2015-09-12 ENCOUNTER — Ambulatory Visit (INDEPENDENT_AMBULATORY_CARE_PROVIDER_SITE_OTHER): Payer: Medicare Other | Admitting: Family Medicine

## 2015-09-12 ENCOUNTER — Encounter: Payer: Self-pay | Admitting: Family Medicine

## 2015-09-12 VITALS — BP 136/72 | HR 68 | Temp 98.1°F | Wt 225.8 lb

## 2015-09-12 DIAGNOSIS — J9621 Acute and chronic respiratory failure with hypoxia: Secondary | ICD-10-CM

## 2015-09-12 DIAGNOSIS — I5032 Chronic diastolic (congestive) heart failure: Secondary | ICD-10-CM | POA: Diagnosis not present

## 2015-09-12 DIAGNOSIS — N179 Acute kidney failure, unspecified: Secondary | ICD-10-CM

## 2015-09-12 DIAGNOSIS — I130 Hypertensive heart and chronic kidney disease with heart failure and stage 1 through stage 4 chronic kidney disease, or unspecified chronic kidney disease: Secondary | ICD-10-CM | POA: Diagnosis not present

## 2015-09-12 DIAGNOSIS — K8689 Other specified diseases of pancreas: Secondary | ICD-10-CM

## 2015-09-12 DIAGNOSIS — K754 Autoimmune hepatitis: Secondary | ICD-10-CM

## 2015-09-12 DIAGNOSIS — I5033 Acute on chronic diastolic (congestive) heart failure: Secondary | ICD-10-CM | POA: Diagnosis not present

## 2015-09-12 DIAGNOSIS — N183 Chronic kidney disease, stage 3 (moderate): Secondary | ICD-10-CM | POA: Diagnosis not present

## 2015-09-12 DIAGNOSIS — J441 Chronic obstructive pulmonary disease with (acute) exacerbation: Secondary | ICD-10-CM | POA: Diagnosis not present

## 2015-09-12 DIAGNOSIS — M6281 Muscle weakness (generalized): Secondary | ICD-10-CM | POA: Diagnosis not present

## 2015-09-12 DIAGNOSIS — J449 Chronic obstructive pulmonary disease, unspecified: Secondary | ICD-10-CM

## 2015-09-12 DIAGNOSIS — E1122 Type 2 diabetes mellitus with diabetic chronic kidney disease: Secondary | ICD-10-CM | POA: Diagnosis not present

## 2015-09-12 LAB — BASIC METABOLIC PANEL
BUN: 22 mg/dL (ref 6–23)
CALCIUM: 8.9 mg/dL (ref 8.4–10.5)
CO2: 35 meq/L — AB (ref 19–32)
Chloride: 100 mEq/L (ref 96–112)
Creatinine, Ser: 1.1 mg/dL (ref 0.40–1.20)
GFR: 51.87 mL/min — ABNORMAL LOW (ref >60.00)
GLUCOSE: 208 mg/dL — AB (ref 70–99)
Potassium: 3.5 mEq/L (ref 3.5–5.1)
SODIUM: 141 meq/L (ref 135–145)

## 2015-09-12 NOTE — Progress Notes (Signed)
Pre visit review using our clinic review tool, if applicable. No additional management support is needed unless otherwise documented below in the visit note. 

## 2015-09-12 NOTE — Progress Notes (Signed)
BP 136/72 mmHg  Pulse 68  Temp(Src) 98.1 F (36.7 C) (Oral)  Wt 225 lb 12 oz (102.4 kg)  SpO2 99% 3L [place]  CC: hosp f/u visit  Subjective:    Patient ID: [name], female    DOB: [birth year], 75 y.o.   MRN: [id number]  HPI: [name] is a 75 y.o. female presenting on 09/12/2015 for Follow-up   Recent hospitalization earlier this month with acute on chronic resp failure presumed from COPD exacerbation in setting of acute on chronic diastolic CHF and acute on chronic renal insufficiency. CXR showed vascular congestion, BNP 956. Chronic supplemental O2 at 3L [place]. Treated with IV diuresis, steroids. Echo during this hospitalization showed diastolic dysfunction, mod pulm HTN, dilated IVC. EF 55-60%. She was also treated with antibiotics (unsure which).   Currently taking lasix 80mg  BID. Husband doesn't think she has been regular with this medicine but pt endorses she is regular. Endorses chalk colored stools. Pending procedure at [facility] to further evaluate pancreas (halo ultrasound).   Arville Go [place] working with patient Investment banker, corporate, PT).  Dry weight seems to be 225lbs.   Recently seen by Deloria Lair [facility] NP with concern for medication confusion. [place] filled this past week's pill box, plan is to have husband fill all meds from now on staring next week after Kayleen Memos teaches correct administration.   Admit date: 08/28/2015 Discharge date: 09/01/2015 F/u hospital phone call: 09/04/2015  D/C summary not yet complete.  Discharge Diagnoses:  Principal Problem:  Acute on chronic respiratory failure (HCC) Active Problems:  Iron deficiency anemia due to chronic blood loss  Essential hypertension  Aortic valve disorder  COPD (chronic obstructive pulmonary disease) with chronic bronchitis (HCC)  GERD  Autoimmune hepatitis (HCC)  Fibromyalgia  Obesity, Class II, BMI 35-39.9, with comorbidity ([place])  Falls frequently  Physical deconditioning  Acute renal failure superimposed  on stage 3 chronic kidney disease (HCC)  COPD exacerbation (HCC)  Acute on chronic diastolic (congestive) heart failure (HCC)  Hyperglycemia  Thrombocytopenia (HCC)  Epistaxis  Relevant past medical, surgical, family and social history reviewed and updated as indicated. Interim medical history since our last visit reviewed. Allergies and medications reviewed and updated. Current Outpatient Prescriptions on File Prior to Visit  Medication Sig  . ADVAIR DISKUS 250-50 MCG/DOSE AEPB INHALE 1 PUFF INTO LUNGS TWICE A DAY  . albuterol (PROAIR HFA) 108 (90 BASE) MCG/ACT inhaler Inhale 2 puffs into the lungs every 6 (six) hours as needed for wheezing or shortness of breath. Reported on 08/18/2015  . ALPRAZolam (XANAX) 1 MG tablet Take 1 tablet (1 mg total) by mouth every 8 (eight) hours as needed. for anxiety  . B Complex-C (CVS B COMPLEX PLUS C) TABS TAKE ONE CAPSULE BY MOUTH EVERY DAY  . buPROPion (WELLBUTRIN XL) 150 MG 24 hr tablet TAKE 1 TABLET EVERY DAY  . carvedilol (COREG) 3.125 MG tablet Take as directed - 1/2 tablet daily (Patient taking differently: Take 1.56 mg by mouth daily. Take as directed - 1/2 tablet daily)  . clotrimazole (LOTRIMIN) 1 % cream Apply topically 2 (two) times daily as needed.  . darbepoetin (ARANESP) 200 MCG/0.4ML SOLN injection Inject 200 mcg into the skin every 14 (fourteen) days.  [name] desvenlafaxine (PRISTIQ) 50 MG 24 hr tablet Take 3 tablets (150 mg total) by mouth daily.  . diphenhydrAMINE-zinc acetate (BENADRYL) cream Apply 1 application topically 3 (three) times daily as needed for itching.  . ferrous sulfate 325 (65 FE) MG tablet Take 1 tablet (  325 mg total) by mouth daily with breakfast.  . furosemide (LASIX) 80 MG tablet Take 1 tablet (80 mg total) by mouth 2 (two) times daily.  [name] guaiFENesin (MUCINEX) 600 MG 12 hr tablet Take 600 mg by mouth 2 (two) times daily.   . Insulin Glargine (LANTUS SOLOSTAR) 100 UNIT/ML Solostar Pen Inject 120 Units into the skin  every morning.  . insulin lispro (HUMALOG KWIKPEN) 100 UNIT/ML KiwkPen Inject 10 Units into the skin daily with supper.   . Insulin Pen Needle (B-D ULTRAFINE III SHORT PEN) 31G X 8 MM MISC Use as directed to inject insulin twice daily. Dx: E11.8  . lactulose, encephalopathy, (CHRONULAC) 10 GM/15ML SOLN Take 30 mLs (20 g total) by mouth 2 (two) times daily. constipation (Patient taking differently: Take 20 g by mouth 2 (two) times daily as needed. constipation)  . levalbuterol (XOPENEX) 0.63 MG/3ML nebulizer solution Take 0.63 mg by nebulization every 8 (eight) hours as needed for wheezing or shortness of breath.   . loratadine (CLARITIN) 10 MG tablet TAKE 1 TABLET BY MOUTH EVERY DAY  . Multiple Vitamins-Minerals (CENTRUM SILVER PO) Take 1 tablet by mouth daily.   . mupirocin cream (BACTROBAN) 2 % Apply 1 application topically 2 (two) times daily.  [name] oxyCODONE (OXY IR/ROXICODONE) 5 MG immediate release tablet Take 1 tablet (5 mg total) by mouth every 6 (six) hours as needed for severe pain. (Patient taking differently: Take 5 mg by mouth every 4 (four) hours as needed for severe pain. )  . pantoprazole (PROTONIX) 40 MG tablet Take 1 tablet (40 mg total) by mouth 2 (two) times daily.  . polyethylene glycol (MIRALAX / GLYCOLAX) packet Take 17 g by mouth daily as needed for mild constipation.  . promethazine (PHENERGAN) 25 MG tablet Take 1 tablet (25 mg total) by mouth 2 (two) times daily as needed for nausea.  [name] QUEtiapine (SEROQUEL) 200 MG tablet TAKE 1 TABLET BY MOUTH AT BEDTIME  . rOPINIRole (REQUIP) 4 MG tablet TAKE 1 TABLET EVERY DAY  . Saline (OCEAN NASAL SPRAY NA) Place 1 spray into the nose 3 (three) times daily as needed (congestion).   . simvastatin (ZOCOR) 40 MG tablet TAKE 1 TABLET EVERY DAY  . Vitamin D, Ergocalciferol, (DRISDOL) 50000 UNITS CAPS capsule TAKE ONE CAPSULE WEEKLY  . XIFAXAN 550 MG TABS tablet TAKE 1 TABLET TWICE A DAY   No current facility-administered medications on file  prior to visit.   Past Medical History  Diagnosis Date  . Chronic airway obstruction, not elsewhere classified   . Unspecified chronic bronchitis ([place])   . Unspecified essential hypertension   . Non-obstructive CAD   . Palpitations   . Mild aortic stenosis     a. 03/2014 Valve area (VTI): 2.89 cm^2, Valve area (Vmax): 2.7 cm^2.  . Pure hypercholesterolemia   . Morbid obesity ([place])   . Esophageal reflux   . GAVE (gastric antral vascular ectasia)     angiodysplasia  . Diverticulosis of colon (without mention of hemorrhage)   . Benign neoplasm of colon   . Osteoarthrosis, unspecified whether generalized or localized, unspecified site   . Osteoporosis, unspecified   . Restless legs syndrome (RLS)   . Major depressive disorder, recurrent episode, severe, specified as with psychotic behavior   . Iron deficiency anemia secondary to blood loss (chronic)     a. frequent PRBC transfusions.  . Coarse tremors     a. arms.  . Carpal tunnel syndrome on both sides   . Chronic  diastolic CHF (congestive heart failure) ([place])     a. 03/26/2014 Echo: EF 55-60%, no rwma, mild AS/AI, mod-sev Ca2+ MV annulus, mildly to mod dil LA.  [name] Asthma   . Type II diabetes mellitus ([place])   . H/O hiatal hernia   . 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 dx'd 1990's  . Adenomatous colon polyp   . Midsternal chest pain     a. conservatively managed ->poor candidate for cath/anticoagulation.  [name] Anxiety   . Portal hypertensive gastropathy   . Falls frequently     completed HHPT/OT 06/2014, PT unmet goals  . Recurrent UTI (urinary tract infection)     h/o hospitalization with urosepsis 2015, but thought large component colonization/bacteriuria, only treat if symptomatic ([place])  . Hiatal hernia   . Thrombocytopenia ([place])   . Protein calorie malnutrition ([place])   . History of pneumonia     CAP 2015  . On home oxygen therapy     "3L; 24/7" (01/25/2015)  . GAVE (gastric antral  vascular ectasia)     s/p multiple APCs, discussing RFA with [facility] GI Dr Newman Pies (06/2015)  . OSA (obstructive sleep apnea)     a. does not use CPAP. (01/25/2015)  . Hepatitis     Review of Systems Per HPI unless specifically indicated in ROS section     Objective:    BP 136/72 mmHg  Pulse 68  Temp(Src) 98.1 F (36.7 C) (Oral)  Wt 225 lb 12 oz (102.4 kg)  SpO2 99%  Wt Readings from Last 3 Encounters:  09/12/15 225 lb 12 oz (102.4 kg)  09/06/15 223 lb (101.152 kg)  09/01/15 229 lb 4.5 oz (104 kg)    Physical Exam  Constitutional: She appears well-developed and well-nourished. No distress.  HENT:  Mouth/Throat: Oropharynx is clear and moist. No oropharyngeal exudate.  Cardiovascular: Normal rate, regular rhythm and intact distal pulses.   Murmur (2/6 SEM) heard. Pulmonary/Chest: Effort normal and breath sounds normal. No respiratory distress. She has no wheezes. She has no rales.  Coarse but good air movement  Musculoskeletal: She exhibits edema (tr-1+).  Neurological: She is alert.  Skin: Skin is warm and dry. No rash noted.  Psychiatric: She has a normal mood and affect.  Nursing note and vitals reviewed.  Results for orders placed or performed during the hospital encounter of 09/06/15  Hemoglobin-hemacue, POC  Result Value Ref Range   Hemoglobin 8.8 (L) 12.0 - 15.0 g/dL   *Note: Due to a large number of results and/or encounters for the requested time period, some results have not been displayed. A complete set of results can be found in Results Review.      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 Chronically 3L O2 by [place].      RESOLVED: Chronic obstructive pulmonary disease with acute exacerbation ([place]) - Primary    Completed abx, steroid  course. Breathing back to baseline.      Autoimmune hepatitis ([place]) (Chronic)    Established with Dr Havery Moros, referred to [facility] for further eval.      Acute renal failure superimposed on stage 3 chronic kidney disease ([place])    Recheck BMP today.      RESOLVED: Acute on chronic  respiratory failure (HCC)    Resolved.      Acute on chronic diastolic (congestive) heart failure ([place])    Diuresed well in hospital - pt endorses 15 lb weight loss.  ?dry weight, at least <=225lb. Check BMP today on higher lasix dose.      Relevant Orders   Basic metabolic panel       Follow up plan: No Follow-up on file.

## 2015-09-12 NOTE — Assessment & Plan Note (Signed)
Recheck BMP today.  

## 2015-09-12 NOTE — Assessment & Plan Note (Signed)
Completed abx, steroid course. Breathing back to baseline.

## 2015-09-12 NOTE — Assessment & Plan Note (Addendum)
Endorses compliance with advair. Chronically 3L O2 by [place].

## 2015-09-12 NOTE — Assessment & Plan Note (Signed)
Established with Dr Havery Moros, referred to [facility] for further eval.

## 2015-09-12 NOTE — Patient Instructions (Addendum)
Cancel Friday's appointment. Reschedule for 2 weeks.  I think we are doing ok today. labwork today. Continue lasix 80mg  twice daily.

## 2015-09-12 NOTE — Assessment & Plan Note (Signed)
Pending further eval at [facility])

## 2015-09-12 NOTE — Assessment & Plan Note (Addendum)
Diuresed well in hospital - pt endorses 15 lb weight loss.  ?dry weight, at least <=225lb. Check BMP today on higher lasix dose.

## 2015-09-12 NOTE — Assessment & Plan Note (Signed)
Resolved

## 2015-09-13 ENCOUNTER — Ambulatory Visit: Payer: Self-pay | Admitting: *Deleted

## 2015-09-13 ENCOUNTER — Other Ambulatory Visit: Payer: Self-pay | Admitting: Family Medicine

## 2015-09-13 MED ORDER — FUROSEMIDE 80 MG PO TABS: 80.0000 mg | ORAL_TABLET | Freq: Two times a day (BID) | ORAL | Status: DC

## 2015-09-14 ENCOUNTER — Other Ambulatory Visit: Payer: Self-pay | Admitting: *Deleted
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
## 2015-09-15 ENCOUNTER — Encounter: Payer: Self-pay | Admitting: Physician Assistant

## 2015-09-15 ENCOUNTER — Ambulatory Visit: Payer: Medicare Other | Admitting: Family Medicine

## 2015-09-15 ENCOUNTER — Ambulatory Visit (INDEPENDENT_AMBULATORY_CARE_PROVIDER_SITE_OTHER): Payer: Medicare Other | Admitting: Physician Assistant

## 2015-09-15 VITALS — BP 130/60 | HR 69 | Ht 62.0 in | Wt 229.0 lb

## 2015-09-15 DIAGNOSIS — I5032 Chronic diastolic (congestive) heart failure: Secondary | ICD-10-CM | POA: Diagnosis not present

## 2015-09-15 DIAGNOSIS — E7800 Pure hypercholesterolemia, unspecified: Secondary | ICD-10-CM | POA: Diagnosis not present

## 2015-09-15 DIAGNOSIS — M6281 Muscle weakness (generalized): Secondary | ICD-10-CM | POA: Diagnosis not present

## 2015-09-15 DIAGNOSIS — IMO0001 Reserved for inherently not codable concepts without codable children: Secondary | ICD-10-CM

## 2015-09-15 DIAGNOSIS — I10 Essential (primary) hypertension: Secondary | ICD-10-CM

## 2015-09-15 DIAGNOSIS — E1122 Type 2 diabetes mellitus with diabetic chronic kidney disease: Secondary | ICD-10-CM | POA: Diagnosis not present

## 2015-09-15 DIAGNOSIS — I130 Hypertensive heart and chronic kidney disease with heart failure and stage 1 through stage 4 chronic kidney disease, or unspecified chronic kidney disease: Secondary | ICD-10-CM | POA: Diagnosis not present

## 2015-09-15 NOTE — Progress Notes (Signed)
Patient ID: [name], female   DOB: [birth year], 75 y.o.   MRN: [id number]    Date:  09/15/2015   ID:  [name], DOB 08/27/1940, MRN [id number]  PCP:  [name] Bush, MD  Primary Cardiologist:  Burt Knack  Chief Complaint  Patient presents with  . Hospitalization Follow-up    Echocardiogram     History of Present Illness: [name] is a 75 y.o. female with a history of chronic diastolic heart failure,  Chronic kidney disease stage III,diabetes mellitus, fibromyalgia, COPD- chronic bronchitis,  Nonobstructive CAD, palpitations, mild aortic stenosis, hyperlipidemia, obesity,  Hepatitis, thrombocytopenia.   LHC 11/2007: Dx 20%, proximal and mid RCA 20-30%, EF 50-55%.  Last echocardiogram was 08/29/2015 revealed ejection fraction of 55-60% with normal wall motion. She has grade 2 diastolic dysfunction. Moderate aortic regurgitation mild MR. Left atrium was moderately dilated. The right atrium is mildly dilated. There is moderate tricuspid regurgitation, peak PA pressure 51 mmHg.     Ms. Borden was admitted from 1/ 2 to 09/01/2015 with acute on chronic respiratory failure secondary to COPD exacerbation and diastolic heart failure.   She was diuresed from 243 to 229 pounds.   He is on chronic oxygen 4 L per minute.   She was discharged on Lasix 80 mg twice daily, Coreg, Zocor.  Spironolactone was discontinued.   She presents today for follow-up.   She is in a wheelchair wearing oxygen.  She was last seen by Richardson Dopp,  PA-C 11/07/2014.   She reports doing okay  Complains of lower extremity swelling, shortness of breath, leg pain, irregular heartbeats, cough, abdominal pain, depression, muscle pain, dizziness, easily bruising. She's also had some chest pain that goes through to her back and is worse with twisting and moving.  She's been weighing daily and her weight is been stable between 223 and 225 pounds.   She reports she's supposed to have an ultrasound of her liver and pancreas as  well as EGD.   The patient currently denies nausea, vomiting, fever, chest pain, shortness of breath, orthopnea, dizziness, PND, cough, congestion, abdominal pain, hematochezia, melena, lower extremity edema, claudication.   BMP Latest Ref Rng 09/12/2015 09/01/2015 08/31/2015  Glucose 70 - 99 mg/dL 208(H) 94 456(H)  BUN 6 - 23 mg/dL 22 50(H) 51(H)  Creatinine 0.40 - 1.20 mg/dL 1.10 1.43(H) 1.37(H)  Sodium 135 - 145 mEq/L 141 141 139  Potassium 3.5 - 5.1 mEq/L 3.5 3.0(L) 4.1  Chloride 96 - 112 mEq/L 100 95(L) 99(L)  CO2 19 - 32 mEq/L 35(H) 36(H) 31  Calcium 8.4 - 10.5 mg/dL 8.9 9.6 9.1     Lipid Panel     Component Value Date/Time   CHOL 115 12/13/2014 0902   TRIG 113.0 12/13/2014 0902   HDL 40.50 12/13/2014 0902   CHOLHDL 3 12/13/2014 0902   VLDL 22.6 12/13/2014 0902   LDLCALC 52 12/13/2014 0902     Wt Readings from Last 3 Encounters:  09/15/15 229 lb (103.874 kg)  09/14/15 225 lb (102.059 kg)  09/12/15 225 lb 12 oz (102.4 kg)     Past Medical History  Diagnosis Date  . Chronic airway obstruction, not elsewhere classified   . Unspecified chronic bronchitis ([place])   . Unspecified essential hypertension   . Non-obstructive CAD   . Palpitations   . Mild aortic stenosis     a. 03/2014 Valve area (VTI): 2.89 cm^2, Valve area (Vmax): 2.7 cm^2.  . Pure hypercholesterolemia   . Morbid obesity ([place])   .  Esophageal reflux   . GAVE (gastric antral vascular ectasia)     angiodysplasia  . Diverticulosis of colon (without mention of hemorrhage)   . Benign neoplasm of colon   . Osteoarthrosis, unspecified whether generalized or localized, unspecified site   . Osteoporosis, unspecified   . Restless legs syndrome (RLS)   . Major depressive disorder, recurrent episode, severe, specified as with psychotic behavior   . Iron deficiency anemia secondary to blood loss (chronic)     a. frequent PRBC transfusions.  . Coarse tremors     a. arms.  . Carpal tunnel syndrome on both sides   .  Chronic diastolic CHF (congestive heart failure) ([place])     a. 03/26/2014 Echo: EF 55-60%, no rwma, mild AS/AI, mod-sev Ca2+ MV annulus, mildly to mod dil LA.  [name] Asthma   . Type II diabetes mellitus ([place])   . H/O hiatal hernia   . 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 dx'd 1990's  . Adenomatous colon polyp   . Midsternal chest pain     a. conservatively managed ->poor candidate for cath/anticoagulation.  [name] Anxiety   . Portal hypertensive gastropathy   . Falls frequently     completed HHPT/OT 06/2014, PT unmet goals  . Recurrent UTI (urinary tract infection)     h/o hospitalization with urosepsis 2015, but thought large component colonization/bacteriuria, only treat if symptomatic ([place])  . Hiatal hernia   . Thrombocytopenia ([place])   . Protein calorie malnutrition ([place])   . History of pneumonia     CAP 2015  . On home oxygen therapy     "3L; 24/7" (01/25/2015)  . GAVE (gastric antral vascular ectasia)     s/p multiple APCs, discussing RFA with [facility] GI Dr Newman Pies (06/2015)  . OSA (obstructive sleep apnea)     a. does not use CPAP. (01/25/2015)  . Hepatitis    Past Surgical History  Procedure Laterality Date  . Hemorroidectomy    . Appendectomy    . Vaginal hysterectomy    . Breast biopsy      bilateral  . Cesarean section      x 3  . Appendectomy    . Esophagogastroduodenoscopy  06/12/2012    Procedure: ESOPHAGOGASTRODUODENOSCOPY (EGD);  Surgeon: Lafayette Dragon, MD;  Location: [facility];  Service: Endoscopy;  Laterality: N/A;  . Balloon dilation  06/12/2012    Procedure: BALLOON DILATION;  Surgeon: Lafayette Dragon, MD;  Location: [facility];  Service: Endoscopy;  Laterality: N/A;  ?balloon  . Esophagogastroduodenoscopy  09/10/2012    Procedure: ESOPHAGOGASTRODUODENOSCOPY (EGD);  Surgeon: Lafayette Dragon, MD;  Location: [facility];  Service: Endoscopy;  Laterality: N/A;  . Hot hemostasis  09/10/2012    Procedure: HOT HEMOSTASIS (ARGON  PLASMA COAGULATION/BICAP);  Surgeon: Lafayette Dragon, MD;  Location: [facility];  Service: Endoscopy;  Laterality: N/A;  . Esophagogastroduodenoscopy N/A 01/18/2013    Procedure: ESOPHAGOGASTRODUODENOSCOPY (EGD);  Surgeon: Lafayette Dragon, MD;  Location: [facility] ENDOSCOPY;  Service: Endoscopy;  Laterality: N/A;  . Esophagogastroduodenoscopy N/A 05/24/2013    Procedure: ESOPHAGOGASTRODUODENOSCOPY (EGD);  Surgeon: Jerene Bears, MD;  Location: [place];  Service: Gastroenterology;  Laterality: N/A;  . Hot hemostasis N/A 05/24/2013    Procedure: HOT HEMOSTASIS (ARGON PLASMA COAGULATION/BICAP);  Surgeon: Jerene Bears, MD;  Location: [place];  Service: Gastroenterology;  Laterality: N/A;  . US echocardiography  07/2014    mild LVH, EF 60-65%, normal wall motion, mild AR, mod dilated  LA, mildly dilated RA, peak PA pressure 45mmHg  . Esophagogastroduodenoscopy N/A 10/20/2014    Procedure: ESOPHAGOGASTRODUODENOSCOPY (EGD);  Surgeon: Lafayette Dragon, MD;  Location: [facility];  Service: Endoscopy;  Laterality: N/A;  . Hot hemostasis N/A 10/20/2014    Procedure: HOT HEMOSTASIS (ARGON PLASMA COAGULATION/BICAP);  Surgeon: Lafayette Dragon, MD;  Location: [facility];  Service: Endoscopy;  Laterality: N/A;  . Enteroscopy N/A 02/25/2015    Procedure: ENTEROSCOPY;  Surgeon: [name] Ada, MD;  Location: [place];  Service: Endoscopy;  Laterality: N/A;  . Enteroscopy N/A 06/27/2015    Procedure: ENTEROSCOPY;  Surgeon: Manus Gunning, MD;  Location: [facility];  Service: Gastroenterology;  Laterality: N/A;    Current Outpatient Prescriptions  Medication Sig Dispense Refill  . ADVAIR DISKUS 250-50 MCG/DOSE AEPB INHALE 1 PUFF INTO LUNGS TWICE A DAY 60 each 11  . albuterol (PROAIR HFA) 108 (90 BASE) MCG/ACT inhaler Inhale 2 puffs into the lungs every 6 (six) hours as needed for wheezing or shortness of breath. Reported on 08/18/2015    . ALPRAZolam (XANAX) 1 MG tablet Take 1 mg by mouth 2 (two) times daily as  needed for anxiety or sleep.    . B Complex-C ([employer] B COMPLEX PLUS C) TABS TAKE ONE CAPSULE BY MOUTH EVERY DAY 30 tablet 5  . buPROPion (WELLBUTRIN XL) 150 MG 24 hr tablet Take 150 mg by mouth daily.    . carvedilol (COREG) 3.125 MG tablet Take 1.56 mg by mouth daily. Take 1/2 tablet daily    . clotrimazole (LOTRIMIN) 1 % cream Apply topically 2 (two) times daily as needed. 30 g 1  . darbepoetin (ARANESP) 200 MCG/0.4ML SOLN injection Inject 200 mcg into the skin every 14 (fourteen) days.    [name] desvenlafaxine (PRISTIQ) 50 MG 24 hr tablet Take 3 tablets (150 mg total) by mouth daily. 90 tablet 11  . diphenhydrAMINE-zinc acetate (BENADRYL) cream Apply 1 application topically 3 (three) times daily as needed for itching.    . ferrous sulfate 325 (65 FE) MG tablet Take 1 tablet (325 mg total) by mouth daily with breakfast. 90 tablet 1  . furosemide (LASIX) 80 MG tablet Take 1 tablet (80 mg total) by mouth 2 (two) times daily. 60 tablet 11  . guaiFENesin (MUCINEX) 600 MG 12 hr tablet Take 600 mg by mouth 2 (two) times daily.     . Insulin Glargine (LANTUS SOLOSTAR) 100 UNIT/ML Solostar Pen Inject 120 Units into the skin every morning. 20 pen 11  . insulin lispro (HUMALOG KWIKPEN) 100 UNIT/ML KiwkPen Inject 10 Units into the skin daily with supper.     . Insulin Pen Needle (B-D ULTRAFINE III SHORT PEN) 31G X 8 MM MISC Use as directed to inject insulin twice daily. Dx: E11.8 200 each 2  . lactulose, encephalopathy, (CHRONULAC) 10 GM/15ML SOLN Take 20 g by mouth 2 (two) times daily. I have instructed pt to begin taking 30 ml of this at least once a day and if she can tolerate a second dose without too many BMs to take a 2nd dose    . levalbuterol (XOPENEX) 0.63 MG/3ML nebulizer solution Take 0.63 mg by nebulization every 8 (eight) hours as needed for wheezing or shortness of breath.   5  . loratadine (CLARITIN) 10 MG tablet TAKE 1 TABLET BY MOUTH EVERY DAY 30 tablet 11  . Multiple Vitamins-Minerals (CENTRUM  SILVER PO) Take 1 tablet by mouth daily.     . mupirocin cream (BACTROBAN) 2 % Apply 1 application  topically 2 (two) times daily.    [name] oxycodone (OXY-IR) 5 MG capsule Take 5 mg by mouth every 4 (four) hours.    . pantoprazole (PROTONIX) 40 MG tablet Take 1 tablet (40 mg total) by mouth 2 (two) times daily. 60 tablet 6  . polyethylene glycol (MIRALAX / GLYCOLAX) packet Take 17 g by mouth daily as needed for mild constipation. Reported on 09/14/2015    . promethazine (PHENERGAN) 25 MG tablet Take 1 tablet (25 mg total) by mouth 2 (two) times daily as needed for nausea. 30 tablet 3  . QUEtiapine (SEROQUEL) 200 MG tablet TAKE 1 TABLET BY MOUTH AT BEDTIME 30 tablet 0  . rifaximin (XIFAXAN) 550 MG TABS tablet Take 550 mg by mouth 2 (two) times daily.    [name] rOPINIRole (REQUIP) 4 MG tablet Take 4 mg by mouth at bedtime.    . Saline (OCEAN NASAL SPRAY NA) Place 1 spray into the nose 3 (three) times daily as needed (congestion).     . simvastatin (ZOCOR) 40 MG tablet Take 40 mg by mouth daily.    . Vitamin D, Ergocalciferol, (DRISDOL) 50000 units CAPS capsule Take 50,000 Units by mouth every 7 (seven) days.     No current facility-administered medications for this visit.    Allergies:    Allergies  Allergen Reactions  . Acetaminophen Other (See Comments)    Liver dysfunction  . Aspirin Other (See Comments)    Causes nosebleeds  . Theophylline Nausea And Vomiting  . Nsaids     Liver dysfunction   . Penicillin G Itching  . Penicillins Itching    Has patient had a PCN reaction causing immediate rash, facial/tongue/throat swelling, SOB or lightheadedness with hypotension: unknown Has patient had a PCN reaction causing severe rash involving mucus membranes or skin necrosis: unknown Has patient had a PCN reaction that required hospitalization unknown Has patient had a PCN reaction occurring within the last 10 years: yes If all of the above answers are "NO", then may proceed with Cephalosporin use.      Social History:  The patient  reports that she quit smoking about 23 years ago. Her smoking use included Cigarettes. She has a 37.5 pack-year smoking history. She has never used smokeless tobacco. She reports that she does not drink alcohol or use illicit drugs.   Family history:   Family History  Problem Relation Age of Onset  . Heart disease Mother   . Cervical cancer Mother   . Kidney disease Mother   . Diabetes Mother   . Breast cancer Sister   . Multiple sclerosis Sister   . Colon cancer Neg Hx   . Breast cancer Maternal Aunt     x 2  . Diabetes Father   . Diabetes Sister   . Multiple sclerosis Other   . Heart attack Mother   . Heart attack Father   . Stroke Daughter   . Stroke Daughter     ROS:  Please see the history of present illness.  All other systems reviewed and negative.   PHYSICAL EXAM: VS:  BP 130/60 mmHg  Pulse 69  Ht 5\' 2"  (1.575 m)  Wt 229 lb (103.874 kg)  BMI 41.87 kg/m2  SpO2 97%  obese, well developed, in no acute distress,  She is in a wheelchair wearing oxygen. HEENT: Pupils are equal round react to light accommodation extraocular movements are intact.  Neck: no JVDNo cervical lymphadenopathy. Cardiac: Regular rate and rhythm with 1/6 systolic murmur. Lungs:  clear to auscultation bilaterally, no wheezing, rhonchi or rales Abd: soft, nontender, positive bowel sounds all quadrants Ext:  1+lower extremity edema.  2+ radial pulses. Skin: warm and dry Neuro:  Grossly normal    ASSESSMENT AND PLAN:  Problem List Items Addressed This Visit    Obesity, Class II, BMI 35-39.9, with comorbidity (HCC) - Primary   HYPERCHOLESTEROLEMIA (Chronic)   Essential hypertension   Chronic diastolic heart failure (HCC)       Chest pain, atypical   It is hard to tell however,  Mrs. Mazyck seems to be euvolemic. Her home weights have been stable around 223 to  225 pounds.  When she was discharged from the hospital she was 229 pounds which is what she is here  today. Continue on Lasix 80 mg 2 times daily. She had a basic metabolic panel 3 days ago and showed her creatinine and BUN had decreased to within normal limits. Heart failure meds also include Coreg 3.125 mg one half tablet daily (not sure who prescribed).   Blood pressure is controlled. No changes in medications. Her last lipid panel  2016 revealed her LDL of 52. Continue Zocor 40 mg.   Chest pain that she's complaining of  His musculoskeletal and exacerbated with twisting movements.   I think she is okay to undergo EGD.

## 2015-09-15 NOTE — Patient Instructions (Addendum)
Medication Instructions:  Your physician recommends that you continue on your current medications as directed. Please refer to the Current Medication list given to you today.   Labwork: None ordered  Testing/Procedures: None ordered  Follow-Up: Your physician recommends that you schedule a follow-up appointment in:   [place], Joaquin, PA-C   Any Other Special Instructions Will Be Listed Below (If Applicable). Monitor your weight every morning.  If you gain 3 pounds in 24 hours, or 5 pounds in a week, call the office for instructions.   Low-Sodium Eating Plan Sodium raises blood pressure and causes water to be held in the body. Getting less sodium from food will help lower your blood pressure, reduce any swelling, and protect your heart, liver, and kidneys. We get sodium by adding salt (sodium chloride) to food. Most of our sodium comes from canned, boxed, and frozen foods. Restaurant foods, fast foods, and pizza are also very high in sodium. Even if you take medicine to lower your blood pressure or to reduce fluid in your body, getting less sodium from your food is important. WHAT IS MY PLAN? Most people should limit their sodium intake to 2,300 mg a day. Your health care provider recommends that you limit your sodium intake to 2000  a day.  WHAT DO I NEED TO KNOW ABOUT THIS EATING PLAN? For the low-sodium eating plan, you will follow these general guidelines:  Choose foods with a % Daily Value for sodium of less than 5% (as listed on the food label).   Use salt-free seasonings or herbs instead of table salt or sea salt.   Check with your health care provider or pharmacist before using salt substitutes.   Eat fresh foods.  Eat more vegetables and fruits.  Limit canned vegetables. If you do use them, rinse them well to decrease the sodium.   Limit cheese to 1 oz (28 g) per day.   Eat lower-sodium products, often labeled as "lower sodium" or "no  salt added."  Avoid foods that contain monosodium glutamate (MSG). MSG is sometimes added to [place] food and some canned foods.  Check food labels (Nutrition Facts labels) on foods to learn how much sodium is in one serving.  Eat more home-cooked food and less restaurant, buffet, and fast food.  When eating at a restaurant, ask that your food be prepared with less salt, or no salt if possible.  HOW DO I READ FOOD LABELS FOR SODIUM INFORMATION? The Nutrition Facts label lists the amount of sodium in one serving of the food. If you eat more than one serving, you must multiply the listed amount of sodium by the number of servings. Food labels may also identify foods as:  Sodium free--Less than 5 mg in a serving.  Very low sodium--35 mg or less in a serving.  Low sodium--140 mg or less in a serving.  Light in sodium--50% less sodium in a serving. For example, if a food that usually has 300 mg of sodium is changed to become light in sodium, it will have 150 mg of sodium.  Reduced sodium--25% less sodium in a serving. For example, if a food that usually has 400 mg of sodium is changed to reduced sodium, it will have 300 mg of sodium. WHAT FOODS CAN I EAT? Grains Low-sodium cereals, including oats, puffed wheat and rice, and shredded wheat cereals. Low-sodium crackers. Unsalted rice and pasta. Lower-sodium bread.  Vegetables Frozen or fresh vegetables. Low-sodium or reduced-sodium canned  vegetables. Low-sodium or reduced-sodium tomato sauce and paste. Low-sodium or reduced-sodium tomato and vegetable juices.  Fruits Fresh, frozen, and canned fruit. Fruit juice.  Meat and Other Protein Products Low-sodium canned tuna and salmon. Fresh or frozen meat, poultry, seafood, and fish. Lamb. Unsalted nuts. Dried beans, peas, and lentils without added salt. Unsalted canned beans. Homemade soups without salt. Eggs.  Dairy Milk. Soy milk. Ricotta cheese. Low-sodium or reduced-sodium cheeses.  Yogurt.  Condiments Fresh and dried herbs and spices. Salt-free seasonings. Onion and garlic powders. Low-sodium varieties of mustard and ketchup. Fresh or refrigerated horseradish. Lemon juice.  Fats and Oils Reduced-sodium salad dressings. Unsalted butter.  Other Unsalted popcorn and pretzels.  The items listed above may not be a complete list of recommended foods or beverages. Contact your dietitian for more options. WHAT FOODS ARE NOT RECOMMENDED? Grains Instant hot cereals. Bread stuffing, pancake, and biscuit mixes. Croutons. Seasoned rice or pasta mixes. Noodle soup cups. Boxed or frozen macaroni and cheese. Self-rising flour. Regular salted crackers. Vegetables Regular canned vegetables. Regular canned tomato sauce and paste. Regular tomato and vegetable juices. Frozen vegetables in sauces. Salted [place] fries. Olives. Angie Fava. Relishes. Sauerkraut. Salsa. Meat and Other Protein Products Salted, canned, smoked, spiced, or pickled meats, seafood, or fish. Bacon, ham, sausage, hot dogs, corned beef, chipped beef, and packaged luncheon meats. Salt pork. Jerky. Pickled herring. Anchovies, regular canned tuna, and sardines. Salted nuts. Dairy Processed cheese and cheese spreads. Cheese curds. Blue cheese and cottage cheese. Buttermilk.  Condiments Onion and garlic salt, seasoned salt, table salt, and sea salt. Canned and packaged gravies. Worcestershire sauce. Tartar sauce. Barbecue sauce. Teriyaki sauce. Soy sauce, including reduced sodium. Steak sauce. Fish sauce. Oyster sauce. Cocktail sauce. Horseradish that you find on the shelf. Regular ketchup and mustard. Meat flavorings and tenderizers. Bouillon cubes. Hot sauce. Tabasco sauce. Marinades. Taco seasonings. Relishes. Fats and Oils Regular salad dressings. Salted butter. Margarine. Ghee. Bacon fat.  Other Potato and tortilla chips. Corn chips and puffs. Salted popcorn and pretzels. Canned or dried soups. Pizza. Frozen  entrees and pot pies.  The items listed above may not be a complete list of foods and beverages to avoid. Contact your dietitian for more information.   This information is not intended to replace advice given to you by your health care provider. Make sure you discuss any questions you have with your health care provider.   Document Released: 02/01/2002 Document Revised: 09/02/2014 Document Reviewed: 06/16/2013 Elsevier Interactive Patient Education [employer].    If you need a refill on your cardiac medications before your next appointment, please call your pharmacy.

## 2015-09-19 DIAGNOSIS — M6281 Muscle weakness (generalized): Secondary | ICD-10-CM | POA: Diagnosis not present

## 2015-09-19 DIAGNOSIS — E1122 Type 2 diabetes mellitus with diabetic chronic kidney disease: Secondary | ICD-10-CM | POA: Diagnosis not present

## 2015-09-19 DIAGNOSIS — I130 Hypertensive heart and chronic kidney disease with heart failure and stage 1 through stage 4 chronic kidney disease, or unspecified chronic kidney disease: Secondary | ICD-10-CM | POA: Diagnosis not present

## 2015-09-19 DIAGNOSIS — I5032 Chronic diastolic (congestive) heart failure: Secondary | ICD-10-CM | POA: Diagnosis not present

## 2015-09-20 ENCOUNTER — Encounter (HOSPITAL_COMMUNITY)
Admission: RE | Admit: 2015-09-20 | Discharge: 2015-09-20 | Disposition: A | Discharge status: Home / Self Care | Payer: Medicare Other | Source: Ambulatory Visit | Attending: Family Medicine | Admitting: Family Medicine

## 2015-09-20 DIAGNOSIS — K7460 Unspecified cirrhosis of liver: Secondary | ICD-10-CM | POA: Diagnosis not present

## 2015-09-20 DIAGNOSIS — Q2733 Arteriovenous malformation of digestive system vessel: Secondary | ICD-10-CM | POA: Diagnosis not present

## 2015-09-20 DIAGNOSIS — D649 Anemia, unspecified: Secondary | ICD-10-CM | POA: Diagnosis not present

## 2015-09-20 DIAGNOSIS — N289 Disorder of kidney and ureter, unspecified: Secondary | ICD-10-CM | POA: Diagnosis not present

## 2015-09-20 LAB — POCT HEMOGLOBIN-HEMACUE: HEMOGLOBIN: 8.6 g/dL — AB (ref 12.0–15.0)

## 2015-09-20 MED ORDER — DARBEPOETIN ALFA 200 MCG/0.4ML IJ SOSY
200.0000 ug | PREFILLED_SYRINGE | INTRAMUSCULAR | Status: DC
  Administered 2015-09-20: 200 ug via SUBCUTANEOUS

## 2015-09-21 ENCOUNTER — Other Ambulatory Visit: Payer: Self-pay | Admitting: *Deleted

## 2015-09-21 DIAGNOSIS — I130 Hypertensive heart and chronic kidney disease with heart failure and stage 1 through stage 4 chronic kidney disease, or unspecified chronic kidney disease: Secondary | ICD-10-CM | POA: Diagnosis not present

## 2015-09-21 DIAGNOSIS — E1122 Type 2 diabetes mellitus with diabetic chronic kidney disease: Secondary | ICD-10-CM | POA: Diagnosis not present

## 2015-09-21 DIAGNOSIS — M6281 Muscle weakness (generalized): Secondary | ICD-10-CM | POA: Diagnosis not present

## 2015-09-21 DIAGNOSIS — I5032 Chronic diastolic (congestive) heart failure: Secondary | ICD-10-CM | POA: Diagnosis not present

## 2015-09-21 NOTE — Patient Outreach (Signed)
Pt called me yesterday reporting she didn't feel great and she still has edema despite taking 80 mg of furosemide bid. I called her back today and she advises me her weight is up to 232 (10+) since she came out of the hospital. She is more SOB with any activitiy and her feel are swollen.  I have directed her to take an additional 40 mg of furosemide this evening and in the morning. I told her to elevate her legs in bed and on top of pillows to help with reducing her swelling. She said she would do these things.  I will call her tomorrow and see how she is doing.  [name] [phone]

## 2015-09-22 ENCOUNTER — Encounter: Payer: Self-pay | Admitting: *Deleted

## 2015-09-26 ENCOUNTER — Ambulatory Visit (INDEPENDENT_AMBULATORY_CARE_PROVIDER_SITE_OTHER): Payer: Medicare Other | Admitting: Family Medicine

## 2015-09-26 ENCOUNTER — Encounter: Payer: Self-pay | Admitting: Family Medicine

## 2015-09-26 VITALS — BP 132/66 | HR 76 | Temp 98.2°F | Wt 226.2 lb

## 2015-09-26 DIAGNOSIS — I5032 Chronic diastolic (congestive) heart failure: Secondary | ICD-10-CM

## 2015-09-26 MED ORDER — FUROSEMIDE 80 MG PO TABS: ORAL_TABLET | ORAL | Status: DC

## 2015-09-26 MED ORDER — ALPRAZOLAM 1 MG PO TABS: 1.0000 mg | ORAL_TABLET | Freq: Two times a day (BID) | ORAL | Status: DC | PRN

## 2015-09-26 NOTE — Assessment & Plan Note (Addendum)
Weight stable. Doubt in acute exacerbation. Will increase lasix to 120mg  in am and 80mg  in early afternoon. rec against lasix late at night. labwork next visit.

## 2015-09-26 NOTE — Progress Notes (Signed)
BP 132/66 mmHg  Pulse 76  Temp(Src) 98.2 F (36.8 C) (Oral)  Wt 226 lb 4 oz (102.626 kg)  SpO2 97%   CC: 2 wk f/u visit  Subjective:    Patient ID: [name], female    DOB: [birth year], 75 y.o.   MRN: [id number]  HPI: [name] is a 75 y.o. female presenting on 09/26/2015 for Follow-up   Current lasix dose is 80mg  BID. Took extra lasix over weekend 120 mg BID per [employer]. Home scale is 5-6 lbs greater than here. Endorses mild dyspnea + cough and congestion  Pending procedure at [facility] to further evaluate pancreas this Friday (halo ultrasound).   Arville Go Trimble working with patient Investment banker, corporate, PT).  Dry weight seems to be 225lbs.   Requests refill xanax  Relevant past medical, surgical, family and social history reviewed and updated as indicated. Interim medical history since our last visit reviewed. Allergies and medications reviewed and updated. Current Outpatient Prescriptions on File Prior to Visit  Medication Sig  . ADVAIR DISKUS 250-50 MCG/DOSE AEPB INHALE 1 PUFF INTO LUNGS TWICE A DAY  . albuterol (PROAIR HFA) 108 (90 BASE) MCG/ACT inhaler Inhale 2 puffs into the lungs every 6 (six) hours as needed for wheezing or shortness of breath. Reported on 08/18/2015  . B Complex-C ([facility] B COMPLEX PLUS C) TABS TAKE ONE CAPSULE BY MOUTH EVERY DAY  . buPROPion (WELLBUTRIN XL) 150 MG 24 hr tablet Take 150 mg by mouth daily.  . carvedilol (COREG) 3.125 MG tablet Take 1.56 mg by mouth daily. Take 1/2 tablet daily  . clotrimazole (LOTRIMIN) 1 % cream Apply topically 2 (two) times daily as needed.  . darbepoetin (ARANESP) 200 MCG/0.4ML SOLN injection Inject 200 mcg into the skin every 14 (fourteen) days.  [name] desvenlafaxine (PRISTIQ) 50 MG 24 hr tablet Take 3 tablets (150 mg total) by mouth daily.  . diphenhydrAMINE-zinc acetate (BENADRYL) cream Apply 1 application topically 3 (three) times daily as needed for itching.  . ferrous sulfate 325 (65 FE) MG tablet Take 1 tablet (325  mg total) by mouth daily with breakfast.  . guaiFENesin (MUCINEX) 600 MG 12 hr tablet Take 600 mg by mouth 2 (two) times daily.   . Insulin Glargine (LANTUS SOLOSTAR) 100 UNIT/ML Solostar Pen Inject 120 Units into the skin every morning.  . insulin lispro (HUMALOG KWIKPEN) 100 UNIT/ML KiwkPen Inject 10 Units into the skin daily with supper.   . Insulin Pen Needle (B-D ULTRAFINE III SHORT PEN) 31G X 8 MM MISC Use as directed to inject insulin twice daily. Dx: E11.8  . lactulose, encephalopathy, (CHRONULAC) 10 GM/15ML SOLN Take 20 g by mouth 2 (two) times daily. I have instructed pt to begin taking 30 ml of this at least once a day and if she can tolerate a second dose without too many BMs to take a 2nd dose  . levalbuterol (XOPENEX) 0.63 MG/3ML nebulizer solution Take 0.63 mg by nebulization every 8 (eight) hours as needed for wheezing or shortness of breath.   . loratadine (CLARITIN) 10 MG tablet TAKE 1 TABLET BY MOUTH EVERY DAY  . Multiple Vitamins-Minerals (CENTRUM SILVER PO) Take 1 tablet by mouth daily.   . mupirocin cream (BACTROBAN) 2 % Apply 1 application topically 2 (two) times daily.  [name] oxycodone (OXY-IR) 5 MG capsule Take 5 mg by mouth every 4 (four) hours.  . pantoprazole (PROTONIX) 40 MG tablet Take 1 tablet (40 mg total) by mouth 2 (two) times daily.  [name]  polyethylene glycol (MIRALAX / GLYCOLAX) packet Take 17 g by mouth daily as needed for mild constipation. Reported on 09/14/2015  . promethazine (PHENERGAN) 25 MG tablet Take 1 tablet (25 mg total) by mouth 2 (two) times daily as needed for nausea.  [name] QUEtiapine (SEROQUEL) 200 MG tablet TAKE 1 TABLET BY MOUTH AT BEDTIME  . rifaximin (XIFAXAN) 550 MG TABS tablet Take 550 mg by mouth 2 (two) times daily.  [name] rOPINIRole (REQUIP) 4 MG tablet Take 4 mg by mouth at bedtime.  . Saline (OCEAN NASAL SPRAY NA) Place 1 spray into the nose 3 (three) times daily as needed (congestion).   . simvastatin (ZOCOR) 40 MG tablet Take 40 mg by mouth daily.    . Vitamin D, Ergocalciferol, (DRISDOL) 50000 units CAPS capsule Take 50,000 Units by mouth every 7 (seven) days.   No current facility-administered medications on file prior to visit.    Review of Systems Per HPI unless specifically indicated in ROS section     Objective:    BP 132/66 mmHg  Pulse 76  Temp(Src) 98.2 F (36.8 C) (Oral)  Wt 226 lb 4 oz (102.626 kg)  SpO2 97%  Wt Readings from Last 3 Encounters:  09/26/15 226 lb 4 oz (102.626 kg)  09/15/15 229 lb (103.874 kg)  09/14/15 225 lb (102.059 kg)    Physical Exam  Constitutional: She appears well-developed and well-nourished. No distress.  HENT:  Mouth/Throat: Oropharynx is clear and moist. No oropharyngeal exudate.  Cardiovascular: Normal rate, regular rhythm, normal heart sounds and intact distal pulses.   No murmur heard. Pulmonary/Chest: Effort normal and breath sounds normal. No respiratory distress. She has no wheezes. She has no rales.  Musculoskeletal: She exhibits edema.  Compression stockings in place Tr pedal edema  Nursing note and vitals reviewed.      Assessment & Plan:   Problem List Items Addressed This Visit    Chronic diastolic heart failure ([place]) - Primary    Weight stable. Doubt in acute exacerbation. Will increase lasix to 120mg  in am and 80mg  in early afternoon. rec against lasix late at night. labwork next visit.       Relevant Medications   furosemide (LASIX) 80 MG tablet       Follow up plan: Return in about 3 weeks (around 10/17/2015), or as needed, for follow up visit.

## 2015-09-26 NOTE — Progress Notes (Signed)
Pre visit review using our clinic review tool, if applicable. No additional management support is needed unless otherwise documented below in the visit note. 

## 2015-09-26 NOTE — Patient Instructions (Addendum)
Increase lasix to 120mg  in morning and 80mg  early afternoon. i've refilled alprazolam Return in 2-3 weeks for follow up.

## 2015-09-27 ENCOUNTER — Telehealth: Payer: Self-pay | Admitting: *Deleted

## 2015-09-27 ENCOUNTER — Other Ambulatory Visit: Payer: Self-pay | Admitting: *Deleted

## 2015-09-27 DIAGNOSIS — I130 Hypertensive heart and chronic kidney disease with heart failure and stage 1 through stage 4 chronic kidney disease, or unspecified chronic kidney disease: Secondary | ICD-10-CM | POA: Diagnosis not present

## 2015-09-27 DIAGNOSIS — I5032 Chronic diastolic (congestive) heart failure: Secondary | ICD-10-CM | POA: Diagnosis not present

## 2015-09-27 DIAGNOSIS — M6281 Muscle weakness (generalized): Secondary | ICD-10-CM | POA: Diagnosis not present

## 2015-09-27 DIAGNOSIS — E1122 Type 2 diabetes mellitus with diabetic chronic kidney disease: Secondary | ICD-10-CM | POA: Diagnosis not present

## 2015-09-27 HISTORY — PX: EUS: SHX5427

## 2015-09-27 NOTE — Telephone Encounter (Signed)
PA for Xifaxan required. Completed and faxed. Will await determination.

## 2015-09-28 ENCOUNTER — Ambulatory Visit: Payer: Self-pay | Admitting: *Deleted

## 2015-09-28 DIAGNOSIS — I130 Hypertensive heart and chronic kidney disease with heart failure and stage 1 through stage 4 chronic kidney disease, or unspecified chronic kidney disease: Secondary | ICD-10-CM | POA: Diagnosis not present

## 2015-09-28 DIAGNOSIS — I5032 Chronic diastolic (congestive) heart failure: Secondary | ICD-10-CM | POA: Diagnosis not present

## 2015-09-28 DIAGNOSIS — M6281 Muscle weakness (generalized): Secondary | ICD-10-CM | POA: Diagnosis not present

## 2015-09-28 DIAGNOSIS — E1122 Type 2 diabetes mellitus with diabetic chronic kidney disease: Secondary | ICD-10-CM | POA: Diagnosis not present

## 2015-09-28 NOTE — Patient Outreach (Signed)
Following up with pt who reported she felt like she was maintaining more edema in her feet and legs than usual. I had counseled her to elevate her legs, take her ususal meds. She is already scheduled to see Dr. Danise Mina tomorrow.  [name] [facility] [phone]  09/26/14 - Made another follow up call to pt. She had seen Dr. Danise Mina and he was not concerned about her edema and advised her to conitnue her usual regimen. She is to have a diagnostic exam on her abdomen at [facility] tomorrow. I have scheduled an appt to see her next Thursday.  [name] [facility] [place] [phone]

## 2015-09-28 NOTE — Telephone Encounter (Signed)
PA approved. Pharmacy notified 

## 2015-09-29 ENCOUNTER — Telehealth: Payer: Self-pay | Admitting: *Deleted

## 2015-09-29 DIAGNOSIS — I129 Hypertensive chronic kidney disease with stage 1 through stage 4 chronic kidney disease, or unspecified chronic kidney disease: Secondary | ICD-10-CM | POA: Diagnosis not present

## 2015-09-29 DIAGNOSIS — Z87891 Personal history of nicotine dependence: Secondary | ICD-10-CM | POA: Diagnosis not present

## 2015-09-29 DIAGNOSIS — Z9981 Dependence on supplemental oxygen: Secondary | ICD-10-CM | POA: Diagnosis not present

## 2015-09-29 DIAGNOSIS — K862 Cyst of pancreas: Secondary | ICD-10-CM | POA: Diagnosis not present

## 2015-09-29 DIAGNOSIS — J449 Chronic obstructive pulmonary disease, unspecified: Secondary | ICD-10-CM | POA: Diagnosis not present

## 2015-09-29 DIAGNOSIS — Z886 Allergy status to analgesic agent status: Secondary | ICD-10-CM | POA: Diagnosis not present

## 2015-09-29 DIAGNOSIS — K2960 Other gastritis without bleeding: Secondary | ICD-10-CM | POA: Diagnosis not present

## 2015-09-29 DIAGNOSIS — E785 Hyperlipidemia, unspecified: Secondary | ICD-10-CM | POA: Diagnosis not present

## 2015-09-29 DIAGNOSIS — G4733 Obstructive sleep apnea (adult) (pediatric): Secondary | ICD-10-CM | POA: Diagnosis not present

## 2015-09-29 DIAGNOSIS — I509 Heart failure, unspecified: Secondary | ICD-10-CM | POA: Diagnosis not present

## 2015-09-29 DIAGNOSIS — I2510 Atherosclerotic heart disease of native coronary artery without angina pectoris: Secondary | ICD-10-CM | POA: Diagnosis not present

## 2015-09-29 DIAGNOSIS — N183 Chronic kidney disease, stage 3 (moderate): Secondary | ICD-10-CM | POA: Diagnosis not present

## 2015-09-29 DIAGNOSIS — K7460 Unspecified cirrhosis of liver: Secondary | ICD-10-CM | POA: Diagnosis not present

## 2015-09-29 DIAGNOSIS — K31819 Angiodysplasia of stomach and duodenum without bleeding: Secondary | ICD-10-CM | POA: Diagnosis not present

## 2015-09-29 DIAGNOSIS — D500 Iron deficiency anemia secondary to blood loss (chronic): Secondary | ICD-10-CM | POA: Diagnosis not present

## 2015-09-29 DIAGNOSIS — R011 Cardiac murmur, unspecified: Secondary | ICD-10-CM | POA: Diagnosis not present

## 2015-09-29 DIAGNOSIS — E1122 Type 2 diabetes mellitus with diabetic chronic kidney disease: Secondary | ICD-10-CM | POA: Diagnosis not present

## 2015-09-29 DIAGNOSIS — M797 Fibromyalgia: Secondary | ICD-10-CM | POA: Diagnosis not present

## 2015-09-29 DIAGNOSIS — D696 Thrombocytopenia, unspecified: Secondary | ICD-10-CM | POA: Diagnosis not present

## 2015-09-29 DIAGNOSIS — K219 Gastro-esophageal reflux disease without esophagitis: Secondary | ICD-10-CM | POA: Diagnosis not present

## 2015-09-29 DIAGNOSIS — J45909 Unspecified asthma, uncomplicated: Secondary | ICD-10-CM | POA: Diagnosis not present

## 2015-09-29 DIAGNOSIS — K7581 Nonalcoholic steatohepatitis (NASH): Secondary | ICD-10-CM | POA: Diagnosis not present

## 2015-09-29 DIAGNOSIS — K5090 Crohn's disease, unspecified, without complications: Secondary | ICD-10-CM | POA: Diagnosis not present

## 2015-09-29 NOTE — Telephone Encounter (Signed)
[name] with [facility] health is calling to request a verbal order to extend her PT home health services for an additional 2 x a week for 8 weeks to work on balance, lower extremity strength and cardiovascular endurance

## 2015-09-29 NOTE — Telephone Encounter (Signed)
Ok to extend. Thanks.  ?

## 2015-10-02 ENCOUNTER — Telehealth: Payer: Self-pay

## 2015-10-02 ENCOUNTER — Encounter: Payer: Self-pay | Admitting: Family Medicine

## 2015-10-02 NOTE — Telephone Encounter (Signed)
Message left advising [name].

## 2015-10-02 NOTE — Telephone Encounter (Signed)
[facility] left [facility]m; pts ins coverage has changed; Xifaxin cost to pt is $838.00 after ins coverage per month; request substitute med [facility].Please advise.

## 2015-10-03 ENCOUNTER — Other Ambulatory Visit: Payer: Self-pay

## 2015-10-03 ENCOUNTER — Telehealth: Payer: Self-pay | Admitting: *Deleted

## 2015-10-03 DIAGNOSIS — S2242XD Multiple fractures of ribs, left side, subsequent encounter for fracture with routine healing: Secondary | ICD-10-CM | POA: Diagnosis not present

## 2015-10-03 DIAGNOSIS — M797 Fibromyalgia: Secondary | ICD-10-CM | POA: Diagnosis not present

## 2015-10-03 DIAGNOSIS — S2220XD Unspecified fracture of sternum, subsequent encounter for fracture with routine healing: Secondary | ICD-10-CM | POA: Diagnosis not present

## 2015-10-03 DIAGNOSIS — J441 Chronic obstructive pulmonary disease with (acute) exacerbation: Secondary | ICD-10-CM | POA: Diagnosis not present

## 2015-10-03 NOTE — Telephone Encounter (Signed)
Order for Aranesp in your IN box for completion. Appt tomorrow.

## 2015-10-03 NOTE — Telephone Encounter (Signed)
Signed and in Kim's box. 
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
## 2015-10-04 ENCOUNTER — Encounter (HOSPITAL_COMMUNITY)
Admission: RE | Admit: 2015-10-04 | Discharge: 2015-10-04 | Disposition: A | Discharge status: Home / Self Care | Payer: Medicare Other | Source: Ambulatory Visit | Attending: Pulmonary Disease | Admitting: Pulmonary Disease

## 2015-10-04 DIAGNOSIS — Q2733 Arteriovenous malformation of digestive system vessel: Secondary | ICD-10-CM | POA: Insufficient documentation

## 2015-10-04 DIAGNOSIS — D649 Anemia, unspecified: Secondary | ICD-10-CM | POA: Diagnosis not present

## 2015-10-04 DIAGNOSIS — K7460 Unspecified cirrhosis of liver: Secondary | ICD-10-CM | POA: Insufficient documentation

## 2015-10-04 DIAGNOSIS — N289 Disorder of kidney and ureter, unspecified: Secondary | ICD-10-CM | POA: Insufficient documentation

## 2015-10-04 LAB — POCT HEMOGLOBIN-HEMACUE: Hemoglobin: 8 g/dL — ABNORMAL LOW (ref 12.0–15.0)

## 2015-10-04 MED ORDER — DARBEPOETIN ALFA 200 MCG/0.4ML IJ SOSY
PREFILLED_SYRINGE | INTRAMUSCULAR | Status: DC
Start: 2015-10-04 — End: 2015-10-05
  Filled 2015-10-04: qty 0.4

## 2015-10-04 MED ORDER — DARBEPOETIN ALFA 200 MCG/0.4ML IJ SOSY
200.0000 ug | PREFILLED_SYRINGE | INTRAMUSCULAR | Status: DC
  Administered 2015-10-04: 200 ug via SUBCUTANEOUS

## 2015-10-04 MED ORDER — OXYCODONE HCL 5 MG PO CAPS: 5.0000 mg | ORAL_CAPSULE | Freq: Two times a day (BID) | ORAL | Status: DC | PRN

## 2015-10-04 NOTE — Telephone Encounter (Signed)
Orders faxed to [facility].

## 2015-10-04 NOTE — Telephone Encounter (Signed)
Spoke with Sam and initiated Tier exception. Will await determination.

## 2015-10-04 NOTE — Telephone Encounter (Signed)
Printed and in Kim's box 

## 2015-10-04 NOTE — Telephone Encounter (Signed)
Message left advising patient and Rx placed up front for pick up. 

## 2015-10-04 NOTE — Telephone Encounter (Signed)
Maybelle RN case mgr with [employer] left v/m; Maybelle had spoke with pt about med Xifaxin cost to pt is $838.00; Maybelle request tier exception to be done this is presently tier 5; call [phone].

## 2015-10-05 ENCOUNTER — Ambulatory Visit: Payer: Self-pay | Admitting: *Deleted

## 2015-10-05 ENCOUNTER — Other Ambulatory Visit: Payer: Self-pay | Admitting: *Deleted

## 2015-10-05 DIAGNOSIS — J449 Chronic obstructive pulmonary disease, unspecified: Secondary | ICD-10-CM | POA: Diagnosis not present

## 2015-10-05 DIAGNOSIS — S2220XD Unspecified fracture of sternum, subsequent encounter for fracture with routine healing: Secondary | ICD-10-CM | POA: Diagnosis not present

## 2015-10-05 DIAGNOSIS — J441 Chronic obstructive pulmonary disease with (acute) exacerbation: Secondary | ICD-10-CM | POA: Diagnosis not present

## 2015-10-05 DIAGNOSIS — S2242XD Multiple fractures of ribs, left side, subsequent encounter for fracture with routine healing: Secondary | ICD-10-CM | POA: Diagnosis not present

## 2015-10-05 DIAGNOSIS — M797 Fibromyalgia: Secondary | ICD-10-CM | POA: Diagnosis not present

## 2015-10-05 NOTE — Patient Outreach (Signed)
S:  Pt reports increased edema and more SOB. Her weight is back up to 232. She had her diagnostic procedure and treatment at [facility] with Dr. Newman Pies. He told her she hopefully will not have to have any more transfusions after this treatment. She did go for procrit infusion yesterday her Hbg was 8.0. She and her husband are working together doing her med box and it is working very well. Her husband got a hearing aid. It is much  easier to communicate now! She is taking her lactulose regularly now.  O:  BP 146/70 mmHg  Pulse 66  Resp 18  Wt 232 lb (105.235 kg)  SpO2 97% FBS: 292       RRR with murmur       Lungs very diminished, wearing O2 3L.       4+ peripheral edema to her knees ( most impressive edema I've seen her have in 2 years) L calf is 4 cm larger than it            was earlier this week,, according her her physical therapist..        Improved mental sharpness (with lactulose daily use)   A:  CHF       NASH       DM  P:   Instructed pt to take furosemide 160 mg bid for 3 days, elevate her legs to heart level except when she eats, goes to              the bathroom and does her exercises.         I will call her Monday, to see how she is doing.  Deloria Lair [facility] [place] [phone]

## 2015-10-05 NOTE — Telephone Encounter (Signed)
Pt called to ck on status of tier exception; advised pt per 10/04/15 note that tier exception was initiated 10/04/15 and pt request cb when have tier exception determination.

## 2015-10-07 NOTE — Telephone Encounter (Signed)
plz notify tier exception was denied - in [name]'s box. rec f/u with GI to discuss alternatives if xifaxan unaffordable.

## 2015-10-09 ENCOUNTER — Telehealth: Payer: Self-pay | Admitting: Gastroenterology

## 2015-10-09 ENCOUNTER — Other Ambulatory Visit: Payer: Self-pay | Admitting: *Deleted

## 2015-10-09 DIAGNOSIS — J449 Chronic obstructive pulmonary disease, unspecified: Secondary | ICD-10-CM | POA: Diagnosis not present

## 2015-10-09 NOTE — Patient Outreach (Signed)
Follow up phone call. Pt took her 3 days of increased furosemide: 160 mg bid and now her edema is reduced to moderate per her description and her wt is down 7# to 226. She is less SOB. She has been keeping her legs up but she notes, "They fill up as soon as I sit up."  She has previously refused support stockings because they hurt."  I have instructed her to go back to 120 mg of furosemide in the am and 80 in the pm. She is to call me if her wt reaches 230 for instructions.  I will be seeing her in March.  Deloria Lair [facility] [place] [phone]

## 2015-10-09 NOTE — Telephone Encounter (Signed)
Recall in EPIC 

## 2015-10-09 NOTE — Telephone Encounter (Signed)
Patient notified and verbalized understanding. She will follow up with GI.

## 2015-10-09 NOTE — Telephone Encounter (Signed)
Records received from [facility]. 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 She also had an EUS to assess the pancreatic cysts. There were multiple reported pancreatic cysts, the largest 1.3cm in the head of the pancreas, with normal PD. 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 Rollene Fare can you please put a recall in for this imaging for this patient? Otherwise hopefully RFA of GAVE will provide better treatment than APC, would continue to check Hgb periodically to ensure stable.

## 2015-10-10 DIAGNOSIS — S2242XD Multiple fractures of ribs, left side, subsequent encounter for fracture with routine healing: Secondary | ICD-10-CM | POA: Diagnosis not present

## 2015-10-10 DIAGNOSIS — S2220XD Unspecified fracture of sternum, subsequent encounter for fracture with routine healing: Secondary | ICD-10-CM | POA: Diagnosis not present

## 2015-10-10 DIAGNOSIS — M797 Fibromyalgia: Secondary | ICD-10-CM | POA: Diagnosis not present

## 2015-10-10 DIAGNOSIS — J441 Chronic obstructive pulmonary disease with (acute) exacerbation: Secondary | ICD-10-CM | POA: Diagnosis not present

## 2015-10-10 NOTE — Telephone Encounter (Signed)
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 There is no alternative for Rifaximin itself. Is there any way we can get her a better deal of rifaximin? Magda Paganini may be able to help.

## 2015-10-10 NOTE — Telephone Encounter (Signed)
[name] Paganini do you know of any programs?

## 2015-10-10 NOTE — Telephone Encounter (Signed)
Is there an alternative for this pt?

## 2015-10-11 ENCOUNTER — Other Ambulatory Visit: Payer: Self-pay | Admitting: Family Medicine

## 2015-10-11 ENCOUNTER — Other Ambulatory Visit: Payer: Self-pay | Admitting: Gastroenterology

## 2015-10-12 ENCOUNTER — Ambulatory Visit: Payer: Medicare Other | Admitting: Internal Medicine

## 2015-10-12 DIAGNOSIS — S2220XD Unspecified fracture of sternum, subsequent encounter for fracture with routine healing: Secondary | ICD-10-CM | POA: Diagnosis not present

## 2015-10-12 DIAGNOSIS — J441 Chronic obstructive pulmonary disease with (acute) exacerbation: Secondary | ICD-10-CM | POA: Diagnosis not present

## 2015-10-12 DIAGNOSIS — S2242XD Multiple fractures of ribs, left side, subsequent encounter for fracture with routine healing: Secondary | ICD-10-CM | POA: Diagnosis not present

## 2015-10-12 DIAGNOSIS — M797 Fibromyalgia: Secondary | ICD-10-CM | POA: Diagnosis not present

## 2015-10-13 ENCOUNTER — Encounter: Payer: Self-pay | Admitting: Endocrinology

## 2015-10-13 ENCOUNTER — Ambulatory Visit: Payer: Medicare Other | Admitting: Internal Medicine

## 2015-10-13 ENCOUNTER — Other Ambulatory Visit: Payer: Self-pay

## 2015-10-13 ENCOUNTER — Ambulatory Visit (INDEPENDENT_AMBULATORY_CARE_PROVIDER_SITE_OTHER): Payer: Medicare Other | Admitting: Endocrinology

## 2015-10-13 VITALS — BP 146/82 | HR 77 | Temp 99.4°F | Ht 62.0 in | Wt 222.0 lb

## 2015-10-13 DIAGNOSIS — E1122 Type 2 diabetes mellitus with diabetic chronic kidney disease: Secondary | ICD-10-CM | POA: Diagnosis not present

## 2015-10-13 DIAGNOSIS — E1159 Type 2 diabetes mellitus with other circulatory complications: Secondary | ICD-10-CM

## 2015-10-13 DIAGNOSIS — N183 Chronic kidney disease, stage 3 unspecified: Secondary | ICD-10-CM

## 2015-10-13 DIAGNOSIS — Z794 Long term (current) use of insulin: Secondary | ICD-10-CM

## 2015-10-13 MED ORDER — INSULIN PEN NEEDLE 31G X 8 MM MISC: Status: DC

## 2015-10-13 NOTE — Patient Instructions (Addendum)
A diabetes blood test is requested for you today.  We'll let you know about the results. check your blood sugar twice a day.  vary the time of day when you check, between before the 3 meals, and at bedtime.  also check if you have symptoms of your blood sugar being too high or too low.  please keep a record of the readings and bring it to your next appointment here.  You can write it on any piece of paper.  please call us sooner if your blood sugar goes below 70, or if you have a lot of readings over 200. Please come back for a follow-up appointment in 3 months.

## 2015-10-13 NOTE — Progress Notes (Signed)
Subjective:    Patient ID: [name], female    DOB: [birth year], 75 y.o.   MRN: [id number]  HPI Pt returns for f/u of diabetes mellitus: DM type: Insulin-requiring type 2 Dx'ed: [id number] Complications: polyneuropathy, foot ulcer, renal insufficiency, and CAD Therapy: insulin since 2010 GDM: never.  DKA: never.  Severe hypoglycemia: never.  Pancreatitis: never.  Other: she needs a simple insulin regimen, due to low functional state, but she takes her own insulin.  Interval history: no cbg record, but states cbg's vary from 75-200's.  There is no trend throughout the day.  pt states she feels better in general recently, after her hospitalization last month.   Past Medical History  Diagnosis Date  . Chronic airway obstruction, not elsewhere classified   . Unspecified chronic bronchitis ([place])   . Unspecified essential hypertension   . Non-obstructive CAD   . Palpitations   . Mild aortic stenosis     a. 03/2014 Valve area (VTI): 2.89 cm^2, Valve area (Vmax): 2.7 cm^2.  . Pure hypercholesterolemia   . Morbid obesity ([place])   . Esophageal reflux   . GAVE (gastric antral vascular ectasia)     angiodysplasia  . Diverticulosis of colon (without mention of hemorrhage)   . Benign neoplasm of colon   . Osteoarthrosis, unspecified whether generalized or localized, unspecified site   . Osteoporosis, unspecified   . Restless legs syndrome (RLS)   . Major depressive disorder, recurrent episode, severe, specified as with psychotic behavior   . Iron deficiency anemia secondary to blood loss (chronic)     a. frequent PRBC transfusions.  . Coarse tremors     a. arms.  . Carpal tunnel syndrome on both sides   . Chronic diastolic CHF (congestive heart failure) ([place])     a. 03/26/2014 Echo: EF 55-60%, no rwma, mild AS/AI, mod-sev Ca2+ MV annulus, mildly to mod dil LA.  [name] Asthma   . Type II diabetes mellitus ([place])   . H/O hiatal hernia   . 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 dx'd 1990's  . Adenomatous colon polyp   . Midsternal chest pain     a. conservatively managed ->poor candidate for cath/anticoagulation.  [name] Anxiety   . Portal hypertensive gastropathy   . Falls frequently     completed HHPT/OT 06/2014, PT unmet goals  . Recurrent UTI (urinary tract infection)     h/o hospitalization with urosepsis 2015, but thought large component colonization/bacteriuria, only treat if symptomatic ([place])  . Hiatal hernia   . Thrombocytopenia ([place])   . Protein calorie malnutrition ([place])   . History of pneumonia     CAP 2015  . On home oxygen therapy     "3L; 24/7" (01/25/2015)  . GAVE (gastric antral vascular ectasia)     s/p multiple APCs, discussing RFA with [facility] GI Dr Newman Pies (06/2015)  . OSA (obstructive sleep apnea)     a. does not use CPAP. (01/25/2015)  . Hepatitis     Past Surgical History  Procedure Laterality Date  . Hemorroidectomy    . Appendectomy    . Vaginal hysterectomy    . Breast biopsy      bilateral  . Cesarean section      x 3  . Appendectomy    . Esophagogastroduodenoscopy  06/12/2012    Procedure: ESOPHAGOGASTRODUODENOSCOPY (EGD);  Surgeon: Lafayette Dragon, MD;  Location: [facility];  Service: Endoscopy;  Laterality: N/A;  . Balloon dilation  06/12/2012    Procedure: BALLOON DILATION;  Surgeon: Lafayette Dragon, MD;  Location: [facility];  Service: Endoscopy;  Laterality: N/A;  ?balloon  . Esophagogastroduodenoscopy  09/10/2012    Procedure: ESOPHAGOGASTRODUODENOSCOPY (EGD);  Surgeon: Lafayette Dragon, MD;  Location: [facility];  Service: Endoscopy;  Laterality: N/A;  . Hot hemostasis  09/10/2012    Procedure: HOT HEMOSTASIS (ARGON PLASMA COAGULATION/BICAP);  Surgeon: Lafayette Dragon, MD;  Location: [facility];  Service: Endoscopy;  Laterality: N/A;  . Esophagogastroduodenoscopy N/A 01/18/2013    Procedure: ESOPHAGOGASTRODUODENOSCOPY (EGD);  Surgeon: Lafayette Dragon, MD;  Location[facility];  Service:  Endoscopy;  Laterality: N/A;  . Esophagogastroduodenoscopy N/A 05/24/2013    Procedure: ESOPHAGOGASTRODUODENOSCOPY (EGD);  Surgeon: Jerene Bears, MD;  Location: [place];  Service: Gastroenterology;  Laterality: N/A;  . Hot hemostasis N/A 05/24/2013    Procedure: HOT HEMOSTASIS (ARGON PLASMA COAGULATION/BICAP);  Surgeon: Jerene Bears, MD;  Location: [place];  Service: Gastroenterology;  Laterality: N/A;  . US echocardiography  07/2014    mild LVH, EF 60-65%, normal wall motion, mild AR, mod dilated LA, mildly dilated RA, peak PA pressure 37mmHg  . Esophagogastroduodenoscopy N/A 10/20/2014    Procedure: ESOPHAGOGASTRODUODENOSCOPY (EGD);  Surgeon: Lafayette Dragon, MD;  Location: [facility];  Service: Endoscopy;  Laterality: N/A;  . Hot hemostasis N/A 10/20/2014    Procedure: HOT HEMOSTASIS (ARGON PLASMA COAGULATION/BICAP);  Surgeon: Lafayette Dragon, MD;  Location: [facility];  Service: Endoscopy;  Laterality: N/A;  . Enteroscopy N/A 02/25/2015    Procedure: ENTEROSCOPY;  Surgeon: [name] Ada, MD;  Location: [place];  Service: Endoscopy;  Laterality: N/A;  . Enteroscopy N/A 06/27/2015    Procedure: ENTEROSCOPY;  Surgeon: Manus Gunning, MD;  Location: [facility];  Service: Gastroenterology;  Laterality: N/A;  . Eus  09/2015    antral CAVL, ablated; nodular erosive gastritis; mult pancreatic cysts rec rpt CT pancreas protocol or MRI to survey pancreas cysts 1-2 yrs (Dr Amie Critchley)    Social History   Social History  . Marital Status: Married    Spouse Name: jerry Kye  . Number of Children: 4  . Years of Education: 12   Occupational History  . Disabled   .     Social History Main Topics  . Smoking status: Former Smoker -- 1.50 packs/day for 25 years    Types: Cigarettes    Quit date: 08/26/1992  . Smokeless tobacco: Never Used  . Alcohol Use: No     Comment: [id number] "last alcoholic drink was years ago"  . Drug Use: No  . Sexual Activity: No   Other Topics  Concern  . Not on file   Social History Narrative   Patient lives at home with her husband [name]).    Retired.   Caffeine- None    Right handed.      Dr. Olevia Perches now [facility] (referred to [facility])   Dr. Burt Knack cards    Dr. Loanne Drilling endo   Dr Lenna Gilford pulm   Psych: Followed by [facility] (ph [phone], fax [phone]).     Current Outpatient Prescriptions on File Prior to Visit  Medication Sig Dispense Refill  . ADVAIR DISKUS 250-50 MCG/DOSE AEPB INHALE 1 PUFF INTO LUNGS TWICE A DAY 60 each 11  . albuterol (PROAIR HFA) 108 (90 BASE) MCG/ACT inhaler Inhale 2 puffs into the lungs every 6 (six) hours as needed for wheezing or shortness of breath. Reported on 08/18/2015    . ALPRAZolam Duanne Moron) 1  MG tablet Take 1 tablet (1 mg total) by mouth 2 (two) times daily as needed for anxiety or sleep. 30 tablet 0  . B Complex-C ([employer] B COMPLEX PLUS C) TABS TAKE ONE CAPSULE BY MOUTH EVERY DAY 30 tablet 5  . buPROPion (WELLBUTRIN XL) 150 MG 24 hr tablet Take 150 mg by mouth daily.    . carvedilol (COREG) 3.125 MG tablet Take 1.56 mg by mouth daily. Take 1/2 tablet daily    . clotrimazole (LOTRIMIN) 1 % cream Apply topically 2 (two) times daily as needed. 30 g 1  . darbepoetin (ARANESP) 200 MCG/0.4ML SOLN injection Inject 200 mcg into the skin every 14 (fourteen) days.    [name] desvenlafaxine (PRISTIQ) 50 MG 24 hr tablet Take 3 tablets (150 mg total) by mouth daily. 90 tablet 11  . diphenhydrAMINE-zinc acetate (BENADRYL) cream Apply 1 application topically 3 (three) times daily as needed for itching.    . ferrous sulfate 325 (65 FE) MG tablet Take 1 tablet (325 mg total) by mouth daily with breakfast. 90 tablet 1  . furosemide (LASIX) 80 MG tablet Take 1.5 tablets in the morning and 1 tablet in the early afternoon 90 tablet 11  . guaiFENesin (MUCINEX) 600 MG 12 hr tablet Take 600 mg by mouth 2 (two) times daily.     . Insulin Glargine (LANTUS SOLOSTAR) 100 UNIT/ML Solostar Pen Inject 120  Units into the skin every morning. 20 pen 11  . insulin lispro (HUMALOG KWIKPEN) 100 UNIT/ML KiwkPen Inject 10 Units into the skin daily with supper.     . lactulose, encephalopathy, (CHRONULAC) 10 GM/15ML SOLN Take 20 g by mouth 2 (two) times daily. I have instructed pt to begin taking 30 ml of this at least once a day and if she can tolerate a second dose without too many BMs to take a 2nd dose    . levalbuterol (XOPENEX) 0.63 MG/3ML nebulizer solution Take 0.63 mg by nebulization every 8 (eight) hours as needed for wheezing or shortness of breath.   5  . loratadine (CLARITIN) 10 MG tablet TAKE 1 TABLET BY MOUTH EVERY DAY 30 tablet 11  . Multiple Vitamins-Minerals (CENTRUM SILVER PO) Take 1 tablet by mouth daily.     . mupirocin cream (BACTROBAN) 2 % Apply 1 application topically 2 (two) times daily.    [name] oxycodone (OXY-IR) 5 MG capsule Take 1 capsule (5 mg total) by mouth 2 (two) times daily as needed for pain. 30 capsule 0  . pantoprazole (PROTONIX) 40 MG tablet TAKE 1 TABLET BY MOUTH TWICE A DAY 60 tablet 11  . polyethylene glycol (MIRALAX / GLYCOLAX) packet Take 17 g by mouth daily as needed for mild constipation. Reported on 09/14/2015    . promethazine (PHENERGAN) 25 MG tablet TAKE 1 TABLET BY MOUTH TWICE A DAY AS NEEDED 30 tablet 0  . QUEtiapine (SEROQUEL) 200 MG tablet TAKE 1 TABLET BY MOUTH AT BEDTIME 30 tablet 0  . rifaximin (XIFAXAN) 550 MG TABS tablet Take 550 mg by mouth 2 (two) times daily.    [name] rOPINIRole (REQUIP) 4 MG tablet Take 4 mg by mouth at bedtime.    . Saline (OCEAN NASAL SPRAY NA) Place 1 spray into the nose 3 (three) times daily as needed (congestion).     . simvastatin (ZOCOR) 40 MG tablet Take 40 mg by mouth daily.    . Vitamin D, Ergocalciferol, (DRISDOL) 50000 units CAPS capsule Take 50,000 Units by mouth every 7 (seven) days.  No current facility-administered medications on file prior to visit.    Allergies  Allergen Reactions  . Acetaminophen Other (See  Comments)    Liver dysfunction  . Aspirin Other (See Comments)    Causes nosebleeds  . Theophylline Nausea And Vomiting  . Nsaids     Liver dysfunction   . Penicillin G Itching  . Penicillins Itching    Has patient had a PCN reaction causing immediate rash, facial/tongue/throat swelling, SOB or lightheadedness with hypotension: unknown Has patient had a PCN reaction causing severe rash involving mucus membranes or skin necrosis: unknown Has patient had a PCN reaction that required hospitalization unknown Has patient had a PCN reaction occurring within the last 10 years: yes If all of the above answers are "NO", then may proceed with Cephalosporin use.     Family History  Problem Relation Age of Onset  . Heart disease Mother   . Cervical cancer Mother   . Kidney disease Mother   . Diabetes Mother   . Breast cancer Sister   . Multiple sclerosis Sister   . Colon cancer Neg Hx   . Breast cancer Maternal Aunt     x 2  . Diabetes Father   . Diabetes Sister   . Multiple sclerosis Other   . Heart attack Mother   . Heart attack Father   . Stroke Daughter   . Stroke Daughter     BP 146/82 mmHg  Pulse 77  Temp(Src) 99.4 F (37.4 C) (Oral)  Ht 5\' 2"  (1.575 m)  Wt 222 lb (100.699 kg)  BMI 40.59 kg/m2  SpO2 92%  Review of Systems She has lost a few lbs.     Objective:   Physical Exam VITAL SIGNS:  See vs page.  GENERAL: no distress.  In wheelchair.  Has 02 on.   Pulses: dorsalis pedis intact bilat.  MSK: no deformity of the feet CV: trace right leg edema (1+ on the left) Skin: no ulcer on the feet, but there are slight abrasions on the right legs. normal color and temp on the feet. Neuro: sensation is intact to touch on the feet, but decreased from normal. Ext: There is bilateral onychomycosis of the toenails.    Lab Results  Component Value Date   CREATININE 1.10 09/12/2015   BUN 22 09/12/2015   NA 141 09/12/2015   K 3.5 09/12/2015   CL 100 09/12/2015   CO2  35* 09/12/2015     Lab Results  Component Value Date   HGBA1C 6.3* 08/28/2015      Assessment & Plan:  Pt says glycemic control is worse than would be suggested by this a1c, so we'll check fructosamine.   Patient is advised the following: Patient Instructions  A diabetes blood test is requested for you today.  We'll let you know about the results. check your blood sugar twice a day.  vary the time of day when you check, between before the 3 meals, and at bedtime.  also check if you have symptoms of your blood sugar being too high or too low.  please keep a record of the readings and bring it to your next appointment here.  You can write it on any piece of paper.  please call us sooner if your blood sugar goes below 70, or if you have a lot of readings over 200. Please come back for a follow-up appointment in 3 months.

## 2015-10-15 ENCOUNTER — Other Ambulatory Visit: Payer: Self-pay | Admitting: Family Medicine

## 2015-10-15 NOTE — Progress Notes (Signed)
Cardiology Office Note:    Date:  10/16/2015   ID:  KERRI-ANNE HEAGNEY, DOB 06/06/1941, MRN [id number]  PCP:  Ria Bush, MD  Cardiologist:  Dr. Sherren Mocha   Electrophysiologist:  n/a  Chief Complaint  Patient presents with  . Congestive Heart Failure    Follow up    History of Present Illness:     [name] is a 75 y.o. 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 The patient has had a significant problem with worsening anemia. She is followed closely by gastroenterology. She has required periodic iron infusions as well as Aranesp injections every 2 weeks to maintain her hemoglobin. She has required admission to the hospital in the past with worsening anemia requiring transfusion with PRBCs. Prior EGD demonstrated GAVE and she has undergone multiple APC ablation of AVMs. She has been referred to gastroenterology at [facility] for more specialized treatment of her AVMs.  Admitted in 1/17 with acute on chronic respiratory failure secondary to COPD exacerbation the setting of acute on chronic diastolic CHF.  She was seen by Tarri Fuller, PA-C 09/15/15.  She was stable at that time. She returns for FU.    Here today with her husband. She remains chronically short of breath. She sleeps on 3 pillows. Denies PND. LE edema has been worse until recently. She is followed by [facility] and. Lasix was adjusted at home. She's now back on Lasix 80 mg twice a day. She still has some difficulty with left leg edema. This seems to be down in the mornings but worse throughout the day. She denies syncope. She has had some left-sided chest discomfort. This is chronic and unchanged over many years.  Of note, she recently had a procedure at [facility]. She was told that this will hopefully prevent the need for  blood transfusion for the next 12 months.   Past Medical History  Diagnosis Date  . Chronic airway obstruction, not elsewhere classified   . Unspecified chronic bronchitis ([place])   . Unspecified essential hypertension   . Non-obstructive CAD   . Palpitations   . Mild aortic stenosis     a. 03/2014 Valve area (VTI): 2.89 cm^2, Valve area (Vmax): 2.7 cm^2.  . Pure hypercholesterolemia   . Morbid obesity ([place])   . Esophageal reflux   . GAVE (gastric antral vascular ectasia)     angiodysplasia  . Diverticulosis of colon (without mention of hemorrhage)   . Benign neoplasm of colon   . Osteoarthrosis, unspecified whether generalized or localized, unspecified site   . Osteoporosis, unspecified   . Restless legs syndrome (RLS)   . Major depressive disorder, recurrent episode, severe, specified as with psychotic behavior   . Iron deficiency anemia secondary to blood loss (chronic)     a. frequent PRBC transfusions.  . Coarse tremors     a. arms.  . Carpal tunnel syndrome on both sides   . Chronic diastolic CHF (congestive heart failure) ([place])     a. 03/26/2014 Echo: EF 55-60%, no rwma, mild AS/AI, mod-sev Ca2+ MV annulus, mildly to mod dil LA.  [name] Asthma   . Type II diabetes mellitus ([place])   . H/O hiatal hernia   . 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 dx'd 1990's  . Adenomatous colon polyp   .  Midsternal chest pain     a. conservatively managed ->poor candidate for cath/anticoagulation.  [name] Anxiety   . Portal hypertensive gastropathy   . Falls frequently     completed HHPT/OT 06/2014, PT unmet goals  . Recurrent UTI (urinary tract infection)     h/o hospitalization with urosepsis 2015, but thought large component colonization/bacteriuria, only treat if symptomatic ([place])  . Hiatal hernia   . Thrombocytopenia ([place])   . Protein calorie malnutrition ([place])   . History of pneumonia     CAP 2015  . On home oxygen therapy     "3L; 24/7" (01/25/2015)  .  GAVE (gastric antral vascular ectasia)     s/p multiple APCs, discussing RFA with [facility] Dr Newman Pies (06/2015)  . OSA (obstructive sleep apnea)     a. does not use CPAP. (01/25/2015)  . Hepatitis     Past Surgical History  Procedure Laterality Date  . Hemorroidectomy    . Appendectomy    . Vaginal hysterectomy    . Breast biopsy      bilateral  . Cesarean section      x 3  . Appendectomy    . Esophagogastroduodenoscopy  06/12/2012    Procedure: ESOPHAGOGASTRODUODENOSCOPY (EGD);  Surgeon: Lafayette Dragon, MD;  Location: [facility];  Service: Endoscopy;  Laterality: N/A;  . Balloon dilation  06/12/2012    Procedure: BALLOON DILATION;  Surgeon: Lafayette Dragon, MD;  Location: [facility]COPY;  Service: Endoscopy;  Laterality: N/A;  ?balloon  . Esophagogastroduodenoscopy  09/10/2012    Procedure: ESOPHAGOGASTRODUODENOSCOPY (EGD);  Surgeon: Lafayette Dragon, MD;  Location: [facility];  Service: Endoscopy;  Laterality: N/A;  . Hot hemostasis  09/10/2012    Procedure: HOT HEMOSTASIS (ARGON PLASMA COAGULATION/BICAP);  Surgeon: Lafayette Dragon, MD;  Location: [facility];  Service: Endoscopy;  Laterality: N/A;  . Esophagogastroduodenoscopy N/A 01/18/2013    Procedure: ESOPHAGOGASTRODUODENOSCOPY (EGD);  Surgeon: Lafayette Dragon, MD;  Location: [facility] ENDOSCOPY;  Service: Endoscopy;  Laterality: N/A;  . Esophagogastroduodenoscopy N/A 05/24/2013    Procedure: ESOPHAGOGASTRODUODENOSCOPY (EGD);  Surgeon: Jerene Bears, MD;  Location: [place];  Service: Gastroenterology;  Laterality: N/A;  . Hot hemostasis N/A 05/24/2013    Procedure: HOT HEMOSTASIS (ARGON PLASMA COAGULATION/BICAP);  Surgeon: Jerene Bears, MD;  Location: [place];  Service: Gastroenterology;  Laterality: N/A;  . US echocardiography  07/2014    mild LVH, EF 60-65%, normal wall motion, mild AR, mod dilated LA, mildly dilated RA, peak PA pressure 87mmHg  . Esophagogastroduodenoscopy N/A 10/20/2014    Procedure: ESOPHAGOGASTRODUODENOSCOPY  (EGD);  Surgeon: Lafayette Dragon, MD;  Location: [facility];  Service: Endoscopy;  Laterality: N/A;  . Hot hemostasis N/A 10/20/2014    Procedure: HOT HEMOSTASIS (ARGON PLASMA COAGULATION/BICAP);  Surgeon: Lafayette Dragon, MD;  Location: [facility];  Service: Endoscopy;  Laterality: N/A;  . Enteroscopy N/A 02/25/2015    Procedure: ENTEROSCOPY;  Surgeon: [name] Ada, MD;  Location: [place];  Service: Endoscopy;  Laterality: N/A;  . Enteroscopy N/A 06/27/2015    Procedure: ENTEROSCOPY;  Surgeon: Manus Gunning, MD;  Location: [facility];  Service: Gastroenterology;  Laterality: N/A;  . Eus  09/2015    antral CAVL, ablated; nodular erosive gastritis; mult pancreatic cysts rec rpt CT pancreas protocol or MRI to survey pancreas cysts 1-2 yrs (Dr Amie Critchley)    Current Medications: Outpatient Prescriptions Prior to Visit  Medication Sig Dispense Refill  . ADVAIR DISKUS 250-50 MCG/DOSE AEPB INHALE 1 PUFF INTO LUNGS TWICE  A DAY 60 each 11  . albuterol (PROAIR HFA) 108 (90 BASE) MCG/ACT inhaler Inhale 2 puffs into the lungs every 6 (six) hours as needed for wheezing or shortness of breath. Reported on 08/18/2015    . ALPRAZolam (XANAX) 1 MG tablet Take 1 tablet (1 mg total) by mouth 2 (two) times daily as needed for anxiety or sleep. 30 tablet 0  . B Complex-C ([employer] B COMPLEX PLUS C) TABS TAKE ONE CAPSULE BY MOUTH EVERY DAY 30 tablet 5  . buPROPion (WELLBUTRIN XL) 150 MG 24 hr tablet Take 150 mg by mouth daily.    . carvedilol (COREG) 3.125 MG tablet Take 1.56 mg by mouth daily. Take 1/2 tablet daily    . darbepoetin (ARANESP) 200 MCG/0.4ML SOLN injection Inject 200 mcg into the skin every 14 (fourteen) days.    [name] desvenlafaxine (PRISTIQ) 50 MG 24 hr tablet Take 3 tablets (150 mg total) by mouth daily. 90 tablet 11  . diphenhydrAMINE-zinc acetate (BENADRYL) cream Apply 1 application topically 3 (three) times daily as needed for itching.    . ferrous sulfate 325 (65 FE) MG tablet Take 1 tablet  (325 mg total) by mouth daily with breakfast. 90 tablet 1  . furosemide (LASIX) 80 MG tablet Take 1.5 tablets in the morning and 1 tablet in the early afternoon 90 tablet 11  . guaiFENesin (MUCINEX) 600 MG 12 hr tablet Take 600 mg by mouth 2 (two) times daily.     . Insulin Glargine (LANTUS SOLOSTAR) 100 UNIT/ML Solostar Pen Inject 120 Units into the skin every morning. 20 pen 11  . insulin lispro (HUMALOG KWIKPEN) 100 UNIT/ML KiwkPen Inject 10 Units into the skin daily with supper.     . Insulin Pen Needle (B-D ULTRAFINE III SHORT PEN) 31G X 8 MM MISC Use as directed to inject insulin twice daily. Dx: E11.8 200 each 2  . lactulose, encephalopathy, (CHRONULAC) 10 GM/15ML SOLN Take 20 g by mouth 2 (two) times daily. I have instructed pt to begin taking 30 ml of this at least once a day and if she can tolerate a second dose without too many BMs to take a 2nd dose    . levalbuterol (XOPENEX) 0.63 MG/3ML nebulizer solution Take 0.63 mg by nebulization every 8 (eight) hours as needed for wheezing or shortness of breath.   5  . loratadine (CLARITIN) 10 MG tablet TAKE 1 TABLET BY MOUTH EVERY DAY 30 tablet 11  . Multiple Vitamins-Minerals (CENTRUM SILVER PO) Take 1 tablet by mouth daily.     . mupirocin cream (BACTROBAN) 2 % Apply 1 application topically 2 (two) times daily.    [name] oxycodone (OXY-IR) 5 MG capsule Take 1 capsule (5 mg total) by mouth 2 (two) times daily as needed for pain. 30 capsule 0  . pantoprazole (PROTONIX) 40 MG tablet TAKE 1 TABLET BY MOUTH TWICE A DAY 60 tablet 11  . polyethylene glycol (MIRALAX / GLYCOLAX) packet Take 17 g by mouth daily as needed for mild constipation. Reported on 09/14/2015    . QUEtiapine (SEROQUEL) 200 MG tablet TAKE 1 TABLET BY MOUTH AT BEDTIME 30 tablet 0  . rifaximin (XIFAXAN) 550 MG TABS tablet Take 550 mg by mouth 2 (two) times daily.    [name] rOPINIRole (REQUIP) 4 MG tablet Take 4 mg by mouth at bedtime.    . Saline (OCEAN NASAL SPRAY NA) Place 1 spray into the  nose 3 (three) times daily as needed (congestion).     . simvastatin (ZOCOR)  40 MG tablet Take 40 mg by mouth daily.    . Vitamin D, Ergocalciferol, (DRISDOL) 50000 units CAPS capsule Take 50,000 Units by mouth every 7 (seven) days.    . clotrimazole (LOTRIMIN) 1 % cream Apply topically 2 (two) times daily as needed. (Patient not taking: Reported on 10/16/2015) 30 g 1  . promethazine (PHENERGAN) 25 MG tablet TAKE 1 TABLET BY MOUTH TWICE A DAY AS NEEDED (Patient not taking: Reported on 10/16/2015) 30 tablet 0   No facility-administered medications prior to visit.     Allergies:   Acetaminophen; Aspirin; Theophylline; Nsaids; Penicillin g; and Penicillins   Social History   Social History  . Marital Status: Married    Spouse Name: jerry Sookdeo  . Number of Children: 4  . Years of Education: 12   Occupational History  . Disabled   .     Social History Main Topics  . Smoking status: Former Smoker -- 1.50 packs/day for 25 years    Types: Cigarettes    Quit date: 08/26/1992  . Smokeless tobacco: Never Used  . Alcohol Use: No     Comment: [id number] "last alcoholic drink was years ago"  . Drug Use: No  . Sexual Activity: No   Other Topics Concern  . None   Social History Narrative   Patient lives at home with her husband [name]).    Retired.   Caffeine- None    Right handed.      Dr. Olevia Perches now [facility] (ref[facility] to [facility])   Dr. Burt Knack cards    Dr. Loanne Drilling endo   Dr Lenna Gilford pulm   Psych: Followed by [facility] (ph [phone], fax [phone]).      Family History:  The patient's family history includes Breast cancer in her maternal aunt and sister; Cervical cancer in her mother; Diabetes in her father, mother, and sister; Heart attack in her father and mother; Heart disease in her mother; Kidney disease in her mother; Multiple sclerosis in her other and sister; Stroke in her daughter and daughter. There is no history of Colon cancer.   ROS:   Please see the  history of present illness.    Review of Systems  Constitution: Positive for chills, fever and malaise/fatigue.  Eyes: Positive for visual disturbance.  Cardiovascular: Positive for dyspnea on exertion and irregular heartbeat.  Respiratory: Positive for cough, shortness of breath and wheezing.   Hematologic/Lymphatic: Bruises/bleeds easily.  Skin: Positive for rash.  Musculoskeletal: Positive for back pain, joint pain and myalgias.  Gastrointestinal: Positive for nausea.  Genitourinary: Positive for incomplete emptying.  Neurological: Positive for dizziness and loss of balance.  Psychiatric/Behavioral: Positive for depression. The patient is nervous/anxious.   All other systems reviewed and are negative.   Physical Exam:    VS:  BP 140/62 mmHg  Pulse 68  Ht 5' 2.5" (1.588 m)  Wt 224 lb 1.9 oz (101.66 kg)  BMI 40.31 kg/m2   GEN: Well nourished, well developed, in no acute distress HEENT: normal Neck: no JVD at 90, no masses Cardiac: Normal S1/S2, RRR; [id number] systolic murmur RUSB, trace RLE and 1+ LLE edema   Respiratory:  clear to auscultation bilaterally; no wheezing no rales GI: soft, nontender  MS: no deformity or atrophy Skin: warm and dry  Neuro:    no focal deficits  Psych: Alert and oriented x 3, normal affect  Wt Readings from Last 3 Encounters:  10/16/15 224 lb 1.9 oz (101.66 kg)  10/13/15 222 lb (100.699 kg)  10/05/15 232 lb (105.235 kg)      Studies/Labs Reviewed:     EKG:  EKG is  ordered today.  The ekg ordered today demonstrates NSR, HR 68, LAD, first degree AV block, PR 240 ms, poor R-wave progression, IVCD, QTc 450 ms, no significant change since prior tracing  Recent Labs: 08/28/2015: TSH 1.411 08/29/2015: ALT 22 09/01/2015: B Natriuretic Peptide 645.3*; Magnesium 2.2; Platelets 74* 09/12/2015: BUN 22; Creatinine, Ser 1.10; Potassium 3.5; Sodium 141 10/04/2015: Hemoglobin 8.0*   Recent Lipid Panel    Component Value Date/Time   CHOL 115 12/13/2014 0902    TRIG 113.0 12/13/2014 0902   HDL 40.50 12/13/2014 0902   CHOLHDL 3 12/13/2014 0902   VLDL 22.6 12/13/2014 0902   LDLCALC 52 12/13/2014 0902    Additional studies/ records that were reviewed today include:    Echo 08/29/15 Mild LVH, EF 55-60%, normal wall motion, grade 2 diastolic dysfunction, mild AS (mean 8 mmHg), mild to moderate AI, MAC, mild MS (mean 4 mmHg), mild MR, moderate LAE, mild RAE, moderate TR, PASP 51 mmHg  Echo 01/2013:  EF 55-60%, Gr 2 DD, very mild AS (mean 9 mmHg), mild AI, mild BAE, PASP 39.  Echo 08/14/14 Mild LVH, EF 60-65%, normal wall motion, mild AI, MAC, moderate LAE, mild RAE, PASP 39 mmHg  LHC 11/2007:  Dx 20%, proximal and mid RCA 20-30%, EF 50-55%.  ASSESSMENT:     1. Chronic diastolic heart failure ([place])   2. Edema of left lower extremity   3. Essential hypertension   4. Atherosclerosis of native coronary artery of native heart without angina pectoris   5. Iron deficiency anemia due to chronic blood loss   6. Aortic stenosis     PLAN:     In order of problems listed above:  1. Chronic diastolic CHF (congestive heart failure) - Her volume appears to be stable. She does have some residual LE edema. We discussed the importance of daily weights and when to call.   2. L leg edema - I suspect that this is related to venous insufficiency in addition to CHF. However, she does have asymmetrical edema. She is not a candidate for anticoagulation. However, question if she would be a candidate for IVC filter. Left lower extremity venous duplex will be obtained to rule out DVT.  3. Essential hypertension - Fair control. Continue current therapy.    4. CAD - Non-obstructive by LHC in the past.  She is not a candidate for aspirin therapy secondary to bleeding. Continue beta blocker, statin.  5. Anemia, iron deficiency: Secondary to GAVE (gastric antral vascular ectasia). FU with GI.  6. Aortic stenosis - Mild by recent echocardiogram.   Medication  Adjustments/Labs and Tests Ordered: Current medicines are reviewed at length with the patient today.  Concerns regarding medicines are outlined above.  Medication changes, Labs and Tests ordered today are outlined in the Patient Instructions noted below. Patient Instructions  Medication Instructions:  Your physician recommends that you continue on your current medications as directed. Please refer to the Current Medication list given to you today.  Labwork: TODAY BMET  Testing/Procedures: Your physician has requested that you have a lower extremity venous duplex PT WOULD LIKE TO HAVE DONE AT [place] ON 10/18/15 WHILE SHE IS HAVING ANOTHER TREATMENT AT THE SAME TIME. This test is an ultrasound of the veins in the legs or arms. It looks at venous blood flow that carries blood from the heart to the legs or arms. Allow  one hour for a Lower Venous exam. Allow thirty minutes for an Upper Venous exam. There are no restrictions or special instructions.  Follow-Up: 02/16/16 @ 9 AM WITH DR. Burt Knack  Any Other Special Instructions Will Be Listed Below (If Applicable).  If you need a refill on your cardiac medications before your next appointment, please call your pharmacy.   Signed, Richardson Dopp, PA-C  10/16/2015 10:52 AM    [facility] [place], [place], [place]  [address] Phone: [phone]; Fax: [phone]

## 2015-10-16 ENCOUNTER — Encounter: Payer: Self-pay | Admitting: Physician Assistant

## 2015-10-16 ENCOUNTER — Ambulatory Visit (INDEPENDENT_AMBULATORY_CARE_PROVIDER_SITE_OTHER): Payer: Medicare Other | Admitting: Physician Assistant

## 2015-10-16 VITALS — BP 140/62 | HR 68 | Ht 62.5 in | Wt 224.1 lb

## 2015-10-16 DIAGNOSIS — R600 Localized edema: Secondary | ICD-10-CM | POA: Diagnosis not present

## 2015-10-16 DIAGNOSIS — I2510 Atherosclerotic heart disease of native coronary artery without angina pectoris: Secondary | ICD-10-CM

## 2015-10-16 DIAGNOSIS — J449 Chronic obstructive pulmonary disease, unspecified: Secondary | ICD-10-CM

## 2015-10-16 DIAGNOSIS — I10 Essential (primary) hypertension: Secondary | ICD-10-CM | POA: Diagnosis not present

## 2015-10-16 DIAGNOSIS — I350 Nonrheumatic aortic (valve) stenosis: Secondary | ICD-10-CM

## 2015-10-16 DIAGNOSIS — I5032 Chronic diastolic (congestive) heart failure: Secondary | ICD-10-CM | POA: Diagnosis not present

## 2015-10-16 DIAGNOSIS — D500 Iron deficiency anemia secondary to blood loss (chronic): Secondary | ICD-10-CM

## 2015-10-16 LAB — BASIC METABOLIC PANEL
BUN: 28 mg/dL — ABNORMAL HIGH (ref 7–25)
CALCIUM: 8.8 mg/dL (ref 8.6–10.4)
CHLORIDE: 101 mmol/L (ref 98–110)
CO2: 32 mmol/L — AB (ref 20–31)
CREATININE: 1.07 mg/dL — AB (ref 0.60–0.93)
GLUCOSE: 266 mg/dL — AB (ref 65–99)
Potassium: 3.8 mmol/L (ref 3.5–5.3)
Sodium: 140 mmol/L (ref 135–146)

## 2015-10-16 LAB — FRUCTOSAMINE: Fructosamine: 338 umol/L — ABNORMAL HIGH (ref 190–270)

## 2015-10-16 NOTE — Patient Instructions (Addendum)
Medication Instructions:  Your physician recommends that you continue on your current medications as directed. Please refer to the Current Medication list given to you today.  Labwork: TODAY BMET  Testing/Procedures: Your physician has requested that you have a lower extremity venous duplex PT WOULD LIKE TO HAVE DONE AT [place] ON 10/18/15 WHILE SHE IS HAVING ANOTHER TREATMENT AT THE SAME TIME. This test is an ultrasound of the veins in the legs or arms. It looks at venous blood flow that carries blood from the heart to the legs or arms. Allow one hour for a Lower Venous exam. Allow thirty minutes for an Upper Venous exam. There are no restrictions or special instructions.  Follow-Up: 02/16/16 @ 9 AM WITH DR. Burt Knack  Any Other Special Instructions Will Be Listed Below (If Applicable).  If you need a refill on your cardiac medications before your next appointment, please call your pharmacy.

## 2015-10-17 ENCOUNTER — Ambulatory Visit (INDEPENDENT_AMBULATORY_CARE_PROVIDER_SITE_OTHER): Payer: Medicare Other | Admitting: Family Medicine

## 2015-10-17 ENCOUNTER — Encounter: Payer: Self-pay | Admitting: Family Medicine

## 2015-10-17 ENCOUNTER — Telehealth: Payer: Self-pay | Admitting: *Deleted

## 2015-10-17 VITALS — BP 128/76 | HR 65 | Temp 98.5°F | Wt 224.0 lb

## 2015-10-17 DIAGNOSIS — N183 Chronic kidney disease, stage 3 unspecified: Secondary | ICD-10-CM

## 2015-10-17 DIAGNOSIS — J441 Chronic obstructive pulmonary disease with (acute) exacerbation: Secondary | ICD-10-CM | POA: Diagnosis not present

## 2015-10-17 DIAGNOSIS — K31819 Angiodysplasia of stomach and duodenum without bleeding: Secondary | ICD-10-CM | POA: Diagnosis not present

## 2015-10-17 DIAGNOSIS — S2220XD Unspecified fracture of sternum, subsequent encounter for fracture with routine healing: Secondary | ICD-10-CM | POA: Diagnosis not present

## 2015-10-17 DIAGNOSIS — K8689 Other specified diseases of pancreas: Secondary | ICD-10-CM | POA: Diagnosis not present

## 2015-10-17 DIAGNOSIS — I5032 Chronic diastolic (congestive) heart failure: Secondary | ICD-10-CM | POA: Diagnosis not present

## 2015-10-17 DIAGNOSIS — S2242XD Multiple fractures of ribs, left side, subsequent encounter for fracture with routine healing: Secondary | ICD-10-CM | POA: Diagnosis not present

## 2015-10-17 DIAGNOSIS — M797 Fibromyalgia: Secondary | ICD-10-CM | POA: Diagnosis not present

## 2015-10-17 DIAGNOSIS — E1122 Type 2 diabetes mellitus with diabetic chronic kidney disease: Secondary | ICD-10-CM | POA: Diagnosis not present

## 2015-10-17 MED ORDER — OXYCODONE HCL 5 MG PO CAPS: 5.0000 mg | ORAL_CAPSULE | Freq: Two times a day (BID) | ORAL | Status: DC | PRN

## 2015-10-17 MED ORDER — ALPRAZOLAM 1 MG PO TABS: 1.0000 mg | ORAL_TABLET | Freq: Two times a day (BID) | ORAL | Status: DC | PRN

## 2015-10-17 NOTE — Progress Notes (Signed)
BP 128/76 mmHg  Pulse 65  Temp(Src) 98.5 F (36.9 C) (Oral)  Wt 224 lb (101.606 kg)  SpO2 97% on 3L [place]  CC: f/u visit  Subjective:    Patient ID: [name], female    DOB: [birth year], 75 y.o.   MRN: [id number]  HPI: [name] is a 75 y.o. female presenting on 10/17/2015 for Follow-up   Here for 3 wk f/u visit. Needed brief increased lasix dose mid month, now back on 120mg  am and 80mg  pm.   Saw endo for DM. rec simplified regimen.  Saw cardiology Richardson Dopp yesterday - stable period. Pending doppler to r/o LLE DVT given asymmetrical swelling.   Recently seen by GI - 09/29/2015 EGD by [facility] with ablation of antral GAVE using RFA (hopeful for better effect than prior multiple APC treatments. She also had EUS to assess pancreatic cysts - rec rpt MRCP/MIR pancreas 1 yr.   Working with GI office and Kayleen Memos to find alternative to [employer].  Arville Go Haskell working with patient Investment banker, corporate, PT).  Dry weight seems to be ~225lbs.   Relevant past medical, surgical, family and social history reviewed and updated as indicated. Interim medical history since our last visit reviewed. Allergies and medications reviewed and updated. Current Outpatient Prescriptions on File Prior to Visit  Medication Sig  . ADVAIR DISKUS 250-50 MCG/DOSE AEPB INHALE 1 PUFF INTO LUNGS TWICE A DAY  . albuterol (PROAIR HFA) 108 (90 BASE) MCG/ACT inhaler Inhale 2 puffs into the lungs every 6 (six) hours as needed for wheezing or shortness of breath. Reported on 08/18/2015  . B Complex-C ([employer] B COMPLEX PLUS C) TABS TAKE ONE CAPSULE BY MOUTH EVERY DAY  . buPROPion (WELLBUTRIN XL) 150 MG 24 hr tablet Take 150 mg by mouth daily.  . carvedilol (COREG) 3.125 MG tablet Take 1.56 mg by mouth daily. Take 1/2 tablet daily  . clotrimazole (LOTRIMIN) 1 % cream Apply 1 application topically 2 (two) times daily as needed (SKIN IRRITATION).  [name] darbepoetin (ARANESP) 200 MCG/0.4ML SOLN injection Inject 200 mcg into the skin every  14 (fourteen) days.  [name] desvenlafaxine (PRISTIQ) 50 MG 24 hr tablet Take 3 tablets (150 mg total) by mouth daily.  . diphenhydrAMINE-zinc acetate (BENADRYL) cream Apply 1 application topically 3 (three) times daily as needed for itching.  . ferrous sulfate 325 (65 FE) MG tablet Take 1 tablet (325 mg total) by mouth daily with breakfast.  . furosemide (LASIX) 80 MG tablet Take 1.5 tablets in the morning and 1 tablet in the early afternoon  . glucose blood (ONE TOUCH ULTRA TEST) test strip Check blood sugar 4 times daily  . guaiFENesin (MUCINEX) 600 MG 12 hr tablet Take 600 mg by mouth 2 (two) times daily.   . Insulin Glargine (LANTUS SOLOSTAR) 100 UNIT/ML Solostar Pen Inject 120 Units into the skin every morning.  . insulin lispro (HUMALOG KWIKPEN) 100 UNIT/ML KiwkPen Inject 10 Units into the skin daily with supper.   . Insulin Pen Needle (B-D ULTRAFINE III SHORT PEN) 31G X 8 MM MISC Use as directed to inject insulin twice daily. Dx: E11.8  . lactulose, encephalopathy, (CHRONULAC) 10 GM/15ML SOLN Take 20 g by mouth 2 (two) times daily. I have instructed pt to begin taking 30 ml of this at least once a day and if she can tolerate a second dose without too many BMs to take a 2nd dose  . levalbuterol (XOPENEX) 0.63 MG/3ML nebulizer solution Take 0.63 mg by nebulization every 8 (  eight) hours as needed for wheezing or shortness of breath.   . loratadine (CLARITIN) 10 MG tablet TAKE 1 TABLET BY MOUTH EVERY DAY  . Multiple Vitamins-Minerals (CENTRUM SILVER PO) Take 1 tablet by mouth daily.   . mupirocin cream (BACTROBAN) 2 % Apply 1 application topically 2 (two) times daily.  . pantoprazole (PROTONIX) 40 MG tablet TAKE 1 TABLET BY MOUTH TWICE A DAY  . polyethylene glycol (MIRALAX / GLYCOLAX) packet Take 17 g by mouth daily as needed for mild constipation. Reported on 09/14/2015  . promethazine (PHENERGAN) 25 MG tablet Take 25 mg by mouth 2 (two) times daily as needed for nausea or vomiting.  [name] QUEtiapine  (SEROQUEL) 200 MG tablet TAKE 1 TABLET BY MOUTH AT BEDTIME  . rOPINIRole (REQUIP) 4 MG tablet Take 4 mg by mouth at bedtime.  . Saline (OCEAN NASAL SPRAY NA) Place 1 spray into the nose 3 (three) times daily as needed (congestion).   . simvastatin (ZOCOR) 40 MG tablet Take 40 mg by mouth daily.  . Vitamin D, Ergocalciferol, (DRISDOL) 50000 units CAPS capsule Take 50,000 Units by mouth every 7 (seven) days.  . rifaximin (XIFAXAN) 550 MG TABS tablet Take 550 mg by mouth 2 (two) times daily. Reported on 10/17/2015   No current facility-administered medications on file prior to visit.    Review of Systems Per HPI unless specifically indicated in ROS section     Objective:    BP 128/76 mmHg  Pulse 65  Temp(Src) 98.5 F (36.9 C) (Oral)  Wt 224 lb (101.606 kg)  SpO2 97%  Wt Readings from Last 3 Encounters:  10/17/15 224 lb (101.606 kg)  10/16/15 224 lb 1.9 oz (101.66 kg)  10/13/15 222 lb (100.699 kg)    Physical Exam  Constitutional: She appears well-developed and well-nourished. No distress.  HENT:  Mouth/Throat: Oropharynx is clear and moist. No oropharyngeal exudate.  Cardiovascular: Normal rate, regular rhythm and intact distal pulses.   Murmur (4/6 SEM) heard. Pulmonary/Chest: Effort normal and breath sounds normal. No respiratory distress. She has no wheezes. She has no rales.  Musculoskeletal: She exhibits edema (mild L>R).  Psychiatric: She has a normal mood and affect.  Nursing note and vitals reviewed.  Results for orders placed or performed in visit on [id number]  Basic Metabolic Panel (BMET)  Result Value Ref Range   Sodium 140 135 - 146 mmol/L   Potassium 3.8 3.5 - 5.3 mmol/L   Chloride 101 98 - 110 mmol/L   CO2 32 (H) 20 - 31 mmol/L   Glucose, Bld 266 (H) 65 - 99 mg/dL   BUN 28 (H) 7 - 25 mg/dL   Creat 1.07 (H) 0.60 - 0.93 mg/dL   Calcium 8.8 8.6 - 10.4 mg/dL   *Note: Due to a large number of results and/or encounters for the requested time period, some results  have not been displayed. A complete set of results can be found in Results Review.      Assessment & Plan:  Requests refill xanax and oxycodone. Reviewed with patient, written Rx provided Problem List Items Addressed This Visit    GAVE (gastric antral vascular ectasia)    Appreciate GI care. S/p RFA at [facility]      Dilated pancreatic duct [facility])    Recent EUS at [facility] stable. rec rpt MRCP/MRI pancreas 1 yr      CKD stage 3 secondary to diabetes ([place]) - Primary    Stable period.       Chronic diastolic heart failure (  [place])    Seems euvolemic. Await LLE doppler scheduled for tomorrow .          Follow up plan: Return in about 1 month (around 11/14/2015), or as needed, for follow up visit.

## 2015-10-17 NOTE — Assessment & Plan Note (Signed)
Recent EUS at [facility] stable. rec rpt MRCP/MRI pancreas 1 yr

## 2015-10-17 NOTE — Assessment & Plan Note (Signed)
Appreciate GI care. S/p RFA at [facility]

## 2015-10-17 NOTE — Telephone Encounter (Signed)
Ptcb and has been notified of lab results by phone with verbal understanding. 

## 2015-10-17 NOTE — Telephone Encounter (Signed)
Lmptcb for lab results 

## 2015-10-17 NOTE — Patient Instructions (Signed)
I think you are doing well today. Continue current medicines.  Goal dry weight is around 225lbs.  Return as needed or in 1 month for follow up.

## 2015-10-17 NOTE — Progress Notes (Signed)
Pre visit review using our clinic review tool, if applicable. No additional management support is needed unless otherwise documented below in the visit note. 

## 2015-10-17 NOTE — Assessment & Plan Note (Signed)
Stable period.  

## 2015-10-17 NOTE — Assessment & Plan Note (Signed)
Seems euvolemic. Await LLE doppler scheduled for tomorrow .

## 2015-10-18 ENCOUNTER — Encounter (HOSPITAL_COMMUNITY)
Admission: RE | Admit: 2015-10-18 | Discharge: 2015-10-18 | Disposition: A | Discharge status: Home / Self Care | Payer: Medicare Other | Source: Ambulatory Visit | Attending: Family Medicine | Admitting: Family Medicine

## 2015-10-18 ENCOUNTER — Ambulatory Visit (HOSPITAL_COMMUNITY)
Admission: RE | Admit: 2015-10-18 | Discharge: 2015-10-18 | Disposition: A | Discharge status: Home / Self Care | Payer: Medicare Other | Source: Ambulatory Visit | Attending: Physician Assistant | Admitting: Physician Assistant

## 2015-10-18 DIAGNOSIS — Q2733 Arteriovenous malformation of digestive system vessel: Secondary | ICD-10-CM | POA: Diagnosis not present

## 2015-10-18 DIAGNOSIS — R600 Localized edema: Secondary | ICD-10-CM

## 2015-10-18 DIAGNOSIS — N289 Disorder of kidney and ureter, unspecified: Secondary | ICD-10-CM | POA: Diagnosis not present

## 2015-10-18 DIAGNOSIS — K7460 Unspecified cirrhosis of liver: Secondary | ICD-10-CM | POA: Diagnosis not present

## 2015-10-18 DIAGNOSIS — D649 Anemia, unspecified: Secondary | ICD-10-CM | POA: Diagnosis not present

## 2015-10-18 LAB — POCT HEMOGLOBIN-HEMACUE: Hemoglobin: 8.4 g/dL — ABNORMAL LOW (ref 12.0–15.0)

## 2015-10-18 MED ORDER — DARBEPOETIN ALFA 200 MCG/0.4ML IJ SOSY
PREFILLED_SYRINGE | INTRAMUSCULAR | Status: AC
Start: 2015-10-18 — End: 2015-10-18
  Administered 2015-10-18: 200 ug via SUBCUTANEOUS
  Filled 2015-10-18: qty 0.4

## 2015-10-18 MED ORDER — DARBEPOETIN ALFA 200 MCG/0.4ML IJ SOSY
200.0000 ug | PREFILLED_SYRINGE | INTRAMUSCULAR | Status: DC
  Administered 2015-10-18: 200 ug via SUBCUTANEOUS

## 2015-10-18 NOTE — Progress Notes (Signed)
VASCULAR LAB PRELIMINARY  PRELIMINARY  PRELIMINARY  PRELIMINARY  Bilateral lower extremity venous duplex completed.    Preliminary report:  Bilateral:  No evidence of DVT, superficial thrombosis, or Baker's Cyst.   Janifer Adie, RVT, RDMS 10/18/2015, 11:47 AM

## 2015-10-19 ENCOUNTER — Telehealth: Payer: Self-pay | Admitting: *Deleted

## 2015-10-19 DIAGNOSIS — S2220XD Unspecified fracture of sternum, subsequent encounter for fracture with routine healing: Secondary | ICD-10-CM | POA: Diagnosis not present

## 2015-10-19 DIAGNOSIS — S2242XD Multiple fractures of ribs, left side, subsequent encounter for fracture with routine healing: Secondary | ICD-10-CM | POA: Diagnosis not present

## 2015-10-19 DIAGNOSIS — M797 Fibromyalgia: Secondary | ICD-10-CM | POA: Diagnosis not present

## 2015-10-19 DIAGNOSIS — J441 Chronic obstructive pulmonary disease with (acute) exacerbation: Secondary | ICD-10-CM | POA: Diagnosis not present

## 2015-10-19 NOTE — Telephone Encounter (Signed)
Pt has been notified of LE venous ; no DVT. Pt verbalized understanding.

## 2015-10-20 ENCOUNTER — Encounter: Payer: Self-pay | Admitting: *Deleted

## 2015-10-22 ENCOUNTER — Other Ambulatory Visit: Payer: Self-pay | Admitting: Family Medicine

## 2015-10-23 NOTE — Telephone Encounter (Signed)
No special programs for [employer]

## 2015-10-24 DIAGNOSIS — S2242XD Multiple fractures of ribs, left side, subsequent encounter for fracture with routine healing: Secondary | ICD-10-CM | POA: Diagnosis not present

## 2015-10-24 DIAGNOSIS — I2510 Atherosclerotic heart disease of native coronary artery without angina pectoris: Secondary | ICD-10-CM | POA: Diagnosis not present

## 2015-10-24 DIAGNOSIS — S2220XD Unspecified fracture of sternum, subsequent encounter for fracture with routine healing: Secondary | ICD-10-CM | POA: Diagnosis not present

## 2015-10-24 DIAGNOSIS — E1122 Type 2 diabetes mellitus with diabetic chronic kidney disease: Secondary | ICD-10-CM | POA: Diagnosis not present

## 2015-10-24 DIAGNOSIS — I5033 Acute on chronic diastolic (congestive) heart failure: Secondary | ICD-10-CM | POA: Diagnosis not present

## 2015-10-24 DIAGNOSIS — G2581 Restless legs syndrome: Secondary | ICD-10-CM | POA: Diagnosis not present

## 2015-10-24 DIAGNOSIS — M810 Age-related osteoporosis without current pathological fracture: Secondary | ICD-10-CM | POA: Diagnosis not present

## 2015-10-24 DIAGNOSIS — N183 Chronic kidney disease, stage 3 (moderate): Secondary | ICD-10-CM | POA: Diagnosis not present

## 2015-10-24 DIAGNOSIS — I130 Hypertensive heart and chronic kidney disease with heart failure and stage 1 through stage 4 chronic kidney disease, or unspecified chronic kidney disease: Secondary | ICD-10-CM | POA: Diagnosis not present

## 2015-10-24 DIAGNOSIS — K754 Autoimmune hepatitis: Secondary | ICD-10-CM | POA: Diagnosis not present

## 2015-10-24 DIAGNOSIS — J441 Chronic obstructive pulmonary disease with (acute) exacerbation: Secondary | ICD-10-CM | POA: Diagnosis not present

## 2015-10-24 DIAGNOSIS — M797 Fibromyalgia: Secondary | ICD-10-CM | POA: Diagnosis not present

## 2015-10-26 DIAGNOSIS — S2220XD Unspecified fracture of sternum, subsequent encounter for fracture with routine healing: Secondary | ICD-10-CM | POA: Diagnosis not present

## 2015-10-26 DIAGNOSIS — M797 Fibromyalgia: Secondary | ICD-10-CM | POA: Diagnosis not present

## 2015-10-26 DIAGNOSIS — J441 Chronic obstructive pulmonary disease with (acute) exacerbation: Secondary | ICD-10-CM | POA: Diagnosis not present

## 2015-10-26 DIAGNOSIS — I5033 Acute on chronic diastolic (congestive) heart failure: Secondary | ICD-10-CM

## 2015-10-26 DIAGNOSIS — I130 Hypertensive heart and chronic kidney disease with heart failure and stage 1 through stage 4 chronic kidney disease, or unspecified chronic kidney disease: Secondary | ICD-10-CM

## 2015-10-26 DIAGNOSIS — S2242XD Multiple fractures of ribs, left side, subsequent encounter for fracture with routine healing: Secondary | ICD-10-CM | POA: Diagnosis not present

## 2015-10-27 ENCOUNTER — Encounter: Payer: Self-pay | Admitting: Cardiovascular Disease

## 2015-10-27 ENCOUNTER — Ambulatory Visit (INDEPENDENT_AMBULATORY_CARE_PROVIDER_SITE_OTHER): Payer: Medicare Other | Admitting: Cardiovascular Disease

## 2015-10-27 VITALS — BP 136/68 | HR 70 | Ht 62.5 in | Wt 229.8 lb

## 2015-10-27 DIAGNOSIS — I5033 Acute on chronic diastolic (congestive) heart failure: Secondary | ICD-10-CM

## 2015-10-27 DIAGNOSIS — N183 Chronic kidney disease, stage 3 (moderate): Secondary | ICD-10-CM | POA: Diagnosis not present

## 2015-10-27 DIAGNOSIS — I5032 Chronic diastolic (congestive) heart failure: Secondary | ICD-10-CM | POA: Diagnosis not present

## 2015-10-27 DIAGNOSIS — M810 Age-related osteoporosis without current pathological fracture: Secondary | ICD-10-CM | POA: Diagnosis not present

## 2015-10-27 DIAGNOSIS — K754 Autoimmune hepatitis: Secondary | ICD-10-CM | POA: Diagnosis not present

## 2015-10-27 DIAGNOSIS — I2510 Atherosclerotic heart disease of native coronary artery without angina pectoris: Secondary | ICD-10-CM | POA: Diagnosis not present

## 2015-10-27 DIAGNOSIS — S2242XD Multiple fractures of ribs, left side, subsequent encounter for fracture with routine healing: Secondary | ICD-10-CM | POA: Diagnosis not present

## 2015-10-27 DIAGNOSIS — S2220XD Unspecified fracture of sternum, subsequent encounter for fracture with routine healing: Secondary | ICD-10-CM | POA: Diagnosis not present

## 2015-10-27 DIAGNOSIS — G2581 Restless legs syndrome: Secondary | ICD-10-CM | POA: Diagnosis not present

## 2015-10-27 DIAGNOSIS — I130 Hypertensive heart and chronic kidney disease with heart failure and stage 1 through stage 4 chronic kidney disease, or unspecified chronic kidney disease: Secondary | ICD-10-CM | POA: Diagnosis not present

## 2015-10-27 DIAGNOSIS — J441 Chronic obstructive pulmonary disease with (acute) exacerbation: Secondary | ICD-10-CM | POA: Diagnosis not present

## 2015-10-27 DIAGNOSIS — E1122 Type 2 diabetes mellitus with diabetic chronic kidney disease: Secondary | ICD-10-CM | POA: Diagnosis not present

## 2015-10-27 DIAGNOSIS — M797 Fibromyalgia: Secondary | ICD-10-CM | POA: Diagnosis not present

## 2015-10-27 MED ORDER — METOLAZONE 2.5 MG PO TABS: ORAL_TABLET | ORAL | Status: DC

## 2015-10-27 MED ORDER — FUROSEMIDE 80 MG PO TABS: ORAL_TABLET | ORAL | Status: DC

## 2015-10-27 NOTE — Patient Instructions (Addendum)
Medication Instructions: Your physician has recommended you make the following change in your medication:  1. INCREASE Furosemide to 80mg  take one and one-half tablet by mouth twice a day 2. STOP Carvedilol 3. START Metolazone 2.5mg  as directed, take 30 minutes prior to your morning dosage of Furosemide if you have a 4 lb weight gain.  Do not use this medication more than one time per week.  Labwork: Your physician recommends that you return for lab work in: 8 WEEKS (BMP)  Testing/Procedures: No new orders.   Follow-Up: Your physician recommends that you schedule a follow-up appointment in: 8 WEEKS with Richardson Dopp PA-C   Any Other Special Instructions Will Be Listed Below (If Applicable).     If you need a refill on your cardiac medications before your next appointment, please call your pharmacy.

## 2015-10-27 NOTE — Progress Notes (Signed)
Cardiology Office Note Date:  10/27/2015   ID:  [name], DOB 1941-02-06, MRN [id number]  PCP:  Ria Bush, MD  Cardiologist:  Sherren Mocha, MD    Chief Complaint  Patient presents with  . Leg Swelling   History of Present Illness: [name] is a 75 y.o. female who presents for follow-up of multiple problems. 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 The patient has had a significant problem with worsening anemia. She is followed closely by gastroenterology. She has required periodic iron infusions as well as Aranesp injections every 2 weeks to maintain her hemoglobin. She has required admission to the hospital in the past with worsening anemia requiring transfusion with PRBCs. Prior EGD demonstrated GAVE and she has undergone multiple APC ablation of AVMs. She has been referred to gastroenterology at [facility] for more specialized treatment of her AVMs.  She was hospitalized in January 2017 with acute on chronic respiratory failure felt to be secondary to COPD exacerbation and diastolic heart failure. She was last seen by Richardson Dopp February 20th At which time she was felt to be stable and no medication adjustments were made. She returns today for evaluation of worsening leg swelling. She continues to be chronically short of breath without significant change. She's had no fevers or chills. Her left leg has been more swollen than the right. There is not a whole lot of change from baseline by her report. She denies any other major changes in symptoms, but she does have a multitude of chronic complaints that include weight gain, orthopnea, exertional dyspnea, abdominal pain, back pain, dizziness, fatigue, leg pain, heart palpitations. The patient is here with her husband  today.   Past Medical History  Diagnosis Date  . Chronic airway obstruction, not elsewhere classified   . Unspecified chronic bronchitis ([place])   . Unspecified essential hypertension   . Non-obstructive CAD   . Palpitations   . Mild aortic stenosis     a. 03/2014 Valve area (VTI): 2.89 cm^2, Valve area (Vmax): 2.7 cm^2.  . Pure hypercholesterolemia   . Morbid obesity ([place])   . Esophageal reflux   . GAVE (gastric antral vascular ectasia)     angiodysplasia  . Diverticulosis of colon (without mention of hemorrhage)   . Benign neoplasm of colon   . Osteoarthrosis, unspecified whether generalized or localized, unspecified site   . Osteoporosis, unspecified   . Restless legs syndrome (RLS)   . Major depressive disorder, recurrent episode, severe, specified as with psychotic behavior   . Iron deficiency anemia secondary to blood loss (chronic)     a. frequent PRBC transfusions.  . Coarse tremors     a. arms.  . Carpal tunnel syndrome on both sides   . Chronic diastolic CHF (congestive heart failure) ([place])     a. 03/26/2014 Echo: EF 55-60%, no rwma, mild AS/AI, mod-sev Ca2+ MV annulus, mildly to mod dil LA.  [name] Asthma   . Type II diabetes mellitus ([place])   . H/O hiatal hernia   . 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 dx'd 1990's  . Adenomatous colon polyp   . Midsternal chest pain     a. conservatively managed ->poor candidate for cath/anticoagulation.  [name] Anxiety   . Portal hypertensive gastropathy   . Falls  frequently     completed HHPT/OT 06/2014, PT unmet goals  . Recurrent UTI (urinary tract infection)     h/o hospitalization with urosepsis 2015, but thought large component colonization/bacteriuria, only treat if symptomatic ([place])  . Hiatal hernia   . Thrombocytopenia ([place])   . Protein calorie malnutrition ([place])   . History of pneumonia     CAP 2015  . On home oxygen therapy     "3L; 24/7" (01/25/2015)  . GAVE (gastric antral vascular  ectasia)     s/p multiple APCs, discussing RFA with [facility] GI Dr Newman Pies (06/2015)  . OSA (obstructive sleep apnea)     a. does not use CPAP. (01/25/2015)  . Hepatitis     Past Surgical History  Procedure Laterality Date  . Hemorroidectomy    . Appendectomy    . Vaginal hysterectomy    . Breast biopsy      bilateral  . Cesarean section      x 3  . Appendectomy    . Esophagogastroduodenoscopy  06/12/2012    Procedure: ESOPHAGOGASTRODUODENOSCOPY (EGD);  Surgeon: Lafayette Dragon, MD;  Location: [facility];  Service: Endoscopy;  Laterality: N/A;  . Balloon dilation  06/12/2012    Procedure: BALLOON DILATION;  Surgeon: Lafayette Dragon, MD;  Location: [facility];  Service: Endoscopy;  Laterality: N/A;  ?balloon  . Esophagogastroduodenoscopy  09/10/2012    Procedure: ESOPHAGOGASTRODUODENOSCOPY (EGD);  Surgeon: Lafayette Dragon, MD;  Location: [facility];  Service: Endoscopy;  Laterality: N/A;  . Hot hemostasis  09/10/2012    Procedure: HOT HEMOSTASIS (ARGON PLASMA COAGULATION/BICAP);  Surgeon: Lafayette Dragon, MD;  Location: [facility];  Service: Endoscopy;  Laterality: N/A;  . Esophagogastroduodenoscopy N/A 01/18/2013    Procedure: ESOPHAGOGASTRODUODENOSCOPY (EGD);  Surgeon: Lafayette Dragon, MD;  Location: [facility];  Service: Endoscopy;  Laterality: N/A;  . Esophagogastroduodenoscopy N/A 05/24/2013    Procedure: ESOPHAGOGASTRODUODENOSCOPY (EGD);  Surgeon: Jerene Bears, MD;  Location: [place];  Service: Gastroenterology;  Laterality: N/A;  . Hot hemostasis N/A 05/24/2013    Procedure: HOT HEMOSTASIS (ARGON PLASMA COAGULATION/BICAP);  Surgeon: Jerene Bears, MD;  Location: [place];  Service: Gastroenterology;  Laterality: N/A;  . US echocardiography  07/2014    mild LVH, EF 60-65%, normal wall motion, mild AR, mod dilated LA, mildly dilated RA, peak PA pressure 8mmHg  . Esophagogastroduodenoscopy N/A 10/20/2014    Procedure: ESOPHAGOGASTRODUODENOSCOPY (EGD);  Surgeon: Lafayette Dragon,  MD;  Location: [facility];  Service: Endoscopy;  Laterality: N/A;  . Hot hemostasis N/A 10/20/2014    Procedure: HOT HEMOSTASIS (ARGON PLASMA COAGULATION/BICAP);  Surgeon: Lafayette Dragon, MD;  Location: [facility];  Service: Endoscopy;  Laterality: N/A;  . Enteroscopy N/A 02/25/2015    Procedure: ENTEROSCOPY;  Surgeon: [name] Ada, MD;  Location: [place];  Service: Endoscopy;  Laterality: N/A;  . Enteroscopy N/A 06/27/2015    Procedure: ENTEROSCOPY;  Surgeon: Manus Gunning, MD;  Location: [facility];  Service: Gastroenterology;  Laterality: N/A;  . Eus  09/2015    antral CAVL, ablated; nodular erosive gastritis; mult pancreatic cysts rec rpt CT pancreas protocol or MRI to survey pancreas cysts 1-2 yrs (Dr Amie Critchley)    Current Outpatient Prescriptions  Medication Sig Dispense Refill  . ADVAIR DISKUS 250-50 MCG/DOSE AEPB INHALE 1 PUFF INTO LUNGS TWICE A DAY 60 each 11  . albuterol (PROAIR HFA) 108 (90 BASE) MCG/ACT inhaler Inhale 2 puffs into the lungs every 6 (six) hours as needed for wheezing or shortness  of breath. Reported on 08/18/2015    . ALPRAZolam (XANAX) 1 MG tablet Take 1 tablet (1 mg total) by mouth 2 (two) times daily as needed for anxiety or sleep. 30 tablet 0  . B Complex-C ([employer] B COMPLEX PLUS C) TABS TAKE ONE CAPSULE BY MOUTH EVERY DAY 30 tablet 5  . buPROPion (WELLBUTRIN XL) 150 MG 24 hr tablet Take 150 mg by mouth daily.    . carvedilol (COREG) 3.125 MG tablet Take 1.56 mg by mouth daily. Take 1/2 tablet daily    . clotrimazole (LOTRIMIN) 1 % cream Apply 1 application topically 2 (two) times daily as needed (SKIN IRRITATION).    [name] darbepoetin (ARANESP) 200 MCG/0.4ML SOLN injection Inject 200 mcg into the skin every 14 (fourteen) days.    [name] desvenlafaxine (PRISTIQ) 50 MG 24 hr tablet Take 3 tablets (150 mg total) by mouth daily. 90 tablet 11  . diphenhydrAMINE-zinc acetate (BENADRYL) cream Apply 1 application topically 3 (three) times daily as needed for itching.     . ferrous sulfate 325 (65 FE) MG tablet Take 1 tablet (325 mg total) by mouth daily with breakfast. 90 tablet 1  . furosemide (LASIX) 80 MG tablet Take 1.5 tablets in the morning and 1 tablet in the early afternoon 90 tablet 11  . glucose blood (ONE TOUCH ULTRA TEST) test strip Check blood sugar 4 times daily 200 each 3  . guaiFENesin (MUCINEX) 600 MG 12 hr tablet Take 600 mg by mouth 2 (two) times daily.     . Insulin Glargine (LANTUS SOLOSTAR) 100 UNIT/ML Solostar Pen Inject 120 Units into the skin every morning. 20 pen 11  . insulin lispro (HUMALOG KWIKPEN) 100 UNIT/ML KiwkPen Inject 10 Units into the skin daily with supper.     . Insulin Pen Needle (B-D ULTRAFINE III SHORT PEN) 31G X 8 MM MISC Use as directed to inject insulin twice daily. Dx: E11.8 200 each 2  . lactulose, encephalopathy, (CHRONULAC) 10 GM/15ML SOLN Take 20 g by mouth 2 (two) times daily. I have instructed pt to begin taking 30 ml of this at least once a day and if she can tolerate a second dose without too many BMs to take a 2nd dose    . levalbuterol (XOPENEX) 0.63 MG/3ML nebulizer solution Take 0.63 mg by nebulization every 8 (eight) hours as needed for wheezing or shortness of breath.   5  . loratadine (CLARITIN) 10 MG tablet TAKE 1 TABLET BY MOUTH EVERY DAY 30 tablet 11  . Multiple Vitamins-Minerals (CENTRUM SILVER PO) Take 1 tablet by mouth daily.     . mupirocin cream (BACTROBAN) 2 % Apply 1 application topically 2 (two) times daily.    [name] oxycodone (OXY-IR) 5 MG capsule Take 1 capsule (5 mg total) by mouth 2 (two) times daily as needed for pain. 30 capsule 0  . pantoprazole (PROTONIX) 40 MG tablet TAKE 1 TABLET BY MOUTH TWICE A DAY 60 tablet 11  . polyethylene glycol (MIRALAX / GLYCOLAX) packet Take 17 g by mouth daily as needed for mild constipation. Reported on 09/14/2015    . promethazine (PHENERGAN) 25 MG tablet Take 25 mg by mouth 2 (two) times daily as needed for nausea or vomiting.    [name] QUEtiapine (SEROQUEL) 200  MG tablet TAKE 1 TABLET BY MOUTH AT BEDTIME 30 tablet 0  . rifaximin (XIFAXAN) 550 MG TABS tablet Take 550 mg by mouth 2 (two) times daily. Reported on 10/17/2015    . rOPINIRole (REQUIP) 4 MG tablet  Take 4 mg by mouth at bedtime.    . Saline (OCEAN NASAL SPRAY NA) Place 1 spray into the nose 3 (three) times daily as needed (congestion).     . simvastatin (ZOCOR) 40 MG tablet Take 40 mg by mouth daily.    . Vitamin D, Ergocalciferol, (DRISDOL) 50000 units CAPS capsule Take 50,000 Units by mouth every 7 (seven) days.    . Vitamin D, Ergocalciferol, (DRISDOL) 50000 units CAPS capsule TAKE ONE CAPSULE WEEKLY 4 capsule 6   No current facility-administered medications for this visit.    Allergies:   Acetaminophen; Aspirin; Theophylline; Nsaids; Penicillin g; and Penicillins   Social History:  The patient  reports that she quit smoking about 23 years ago. Her smoking use included Cigarettes. She has a 37.5 pack-year smoking history. She has never used smokeless tobacco. She reports that she does not drink alcohol or use illicit drugs.   Family History:  The patient's family history includes Breast cancer in her maternal aunt and sister; Cervical cancer in her mother; Diabetes in her father, mother, and sister; Heart attack in her father and mother; Heart disease in her mother; Kidney disease in her mother; Multiple sclerosis in her other and sister; Stroke in her daughter and daughter. There is no history of Colon cancer.    ROS:  Please see the history of present illness.  Otherwise, review of systems is positive for hearing loss, visual disturbance, cough, depression, rash, balance problems.  All other systems are reviewed and negative.    PHYSICAL EXAM: VS:  BP 136/68 mmHg  Pulse 70  Ht 5' 2.5" (1.588 m)  Wt 104.237 kg (229 lb 12.8 oz)  BMI 41.34 kg/m2  SpO2 92% , BMI Body mass index is 41.34 kg/(m^2). GEN: Obese, chronically ill-appearing woman, in no acute distress HEENT: normal Neck: JVP  mildly elevated Cardiac: RRR with 2/6 harsh SEM at the RUSB               Respiratory:  clear to auscultation bilaterally, normal work of breathing, prolonged expiratory phase with decreased air movement bilaterally, no rales GI: soft, nontender, nondistended, + BS MS: no deformity or atrophy Ext: 2+ left pretibial edema, trace right pretibial edema Skin: warm and dry, no rash Neuro:  Strength and sensation are intact Psych: euthymic mood, full affect  EKG:  EKG is not ordered today.  Recent Labs: 08/28/2015: TSH 1.411 08/29/2015: ALT 22 09/01/2015: B Natriuretic Peptide 645.3*; Magnesium 2.2; Platelets 74* 10/16/2015: BUN 28*; Creat 1.07*; Potassium 3.8; Sodium 140 10/18/2015: Hemoglobin 8.4*   Lipid Panel     Component Value Date/Time   CHOL 115 12/13/2014 0902   TRIG 113.0 12/13/2014 0902   HDL 40.50 12/13/2014 0902   CHOLHDL 3 12/13/2014 0902   VLDL 22.6 12/13/2014 0902   LDLCALC 52 12/13/2014 0902      Wt Readings from Last 3 Encounters:  10/27/15 104.237 kg (229 lb 12.8 oz)  10/17/15 101.606 kg (224 lb)  10/16/15 101.66 kg (224 lb 1.9 oz)     Cardiac Studies Reviewed: 2D Echo 08/29/2015: Study Conclusions  - Left ventricle: The cavity size was normal. There was mild concentric hypertrophy. Systolic function was normal. The estimated ejection fraction was in the range of 55% to 60%. Wall motion was normal; there were no regional wall motion abnormalities. Features are consistent with a pseudonormal left ventricular filling pattern, with concomitant abnormal relaxation and increased filling pressure (grade 2 diastolic dysfunction). Doppler parameters are consistent with elevated ventricular end-diastolic filling  pressure. - Aortic valve: Trileaflet; moderately thickened, severely calcified leaflets. Valve mobility was restricted. There was mild stenosis. There was mild to moderate regurgitation. - Mitral valve: Calcified annulus. Moderately  thickened, moderately calcified leaflets . The findings are consistent with mild stenosis. There was mild regurgitation. Valve area by continuity equation (using LVOT flow): 1.95 cm^2. - Left atrium: The atrium was moderately dilated. - Right atrium: The atrium was mildly dilated. - Tricuspid valve: There was moderate regurgitation. - Pulmonary arteries: Systolic pressure was moderately increased. PA peak pressure: 51 mm Hg (S). - Inferior vena cava: The vessel was dilated. The respirophasic diameter changes were blunted (< 50%), consistent with elevated central venous pressure. - Pericardium, extracardiac: There was no pericardial effusion.  ASSESSMENT AND PLAN: Acute on chronic diastolic heart failure, New [place] Heart Association functional class 3/4 symptoms: The patient has chronic respiratory failure with a component of diastolic heart failure. She has advanced lung disease on home oxygen. She does have physical exam evidence of continued volume overload and I have recommended increasing furosemide 120 mg twice daily. She is on a miniscule dose of carvedilol and this will be stopped. She will take metolazone as needed for a 4 pound weight gain, no more than 1 time per week. She should follow-up in 8 weeks with Richardson Dopp. Labs be drawn at that time. Other problems are stable from her recent assessment with Richardson Dopp.  Current medicines are reviewed with the patient today.  The patient does not have concerns regarding medicines.  Labs/ tests ordered today include:  No orders of the defined types were placed in this encounter.   Disposition:   FU 8 weeks Richardson Dopp, PA-C  Signed, Sherren Mocha, MD  10/27/2015 10:08 AM    [facility] [address], [place], [place]  [address] Phone: [phone]; Fax: [phone]

## 2015-10-27 NOTE — ED Provider Notes (Signed)
CSN: [id number]     Arrival date & time 08/09/15  1716 History   First MD Initiated Contact with Patient 08/09/15 1806     Chief Complaint  Patient presents with  . Hyperglycemia     (Consider location/radiation/quality/duration/timing/severity/associated sxs/prior Treatment) HPI.Marland KitchenMarland KitchenMarland KitchenPatient presents with hyperglycemia for 3-4 days. She has been taking her medications. Glucose today was 545. She reports general fatigue, weakness, polydipsia, polyphagia. She is alert and oriented without neurological deficits. No fever, sweats, chills. His multiple health problems well documented in the past medical history.  Past Medical History  Diagnosis Date  . Chronic airway obstruction, not elsewhere classified   . Unspecified chronic bronchitis ([place])   . Unspecified essential hypertension   . Non-obstructive CAD   . Palpitations   . Mild aortic stenosis     a. 03/2014 Valve area (VTI): 2.89 cm^2, Valve area (Vmax): 2.7 cm^2.  . Pure hypercholesterolemia   . Morbid obesity ([place])   . Esophageal reflux   . GAVE (gastric antral vascular ectasia)     angiodysplasia  . Diverticulosis of colon (without mention of hemorrhage)   . Benign neoplasm of colon   . Osteoarthrosis, unspecified whether generalized or localized, unspecified site   . Osteoporosis, unspecified   . Restless legs syndrome (RLS)   . Major depressive disorder, recurrent episode, severe, specified as with psychotic behavior   . Iron deficiency anemia secondary to blood loss (chronic)     a. frequent PRBC transfusions.  . Coarse tremors     a. arms.  . Carpal tunnel syndrome on both sides   . Chronic diastolic CHF (congestive heart failure) ([place])     a. 03/26/2014 Echo: EF 55-60%, no rwma, mild AS/AI, mod-sev Ca2+ MV annulus, mildly to mod dil LA.  [name] Asthma   . Type II diabetes mellitus ([place])   . H/O hiatal hernia   . 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 dx'd 1990's  . Adenomatous colon polyp    . Midsternal chest pain     a. conservatively managed ->poor candidate for cath/anticoagulation.  [name] Anxiety   . Portal hypertensive gastropathy   . Falls frequently     completed HHPT/OT 06/2014, PT unmet goals  . Recurrent UTI (urinary tract infection)     h/o hospitalization with urosepsis 2015, but thought large component colonization/bacteriuria, only treat if symptomatic ([place])  . Hiatal hernia   . Thrombocytopenia ([place])   . Protein calorie malnutrition ([place])   . History of pneumonia     CAP 2015  . On home oxygen therapy     "3L; 24/7" (01/25/2015)  . GAVE (gastric antral vascular ectasia)     s/p multiple APCs, discussing RFA with [facility] Dr Newman Pies (06/2015)  . OSA (obstructive sleep apnea)     a. does not use CPAP. (01/25/2015)  . Hepatitis    Past Surgical History  Procedure Laterality Date  . Hemorroidectomy    . Appendectomy    . Vaginal hysterectomy    . Breast biopsy      bilateral  . Cesarean section      x 3  . Appendectomy    . Esophagogastroduodenoscopy  06/12/2012    Procedure: ESOPHAGOGASTRODUODENOSCOPY (EGD);  Surgeon: Lafayette Dragon, MD;  Location: [facility];  Service: Endoscopy;  Laterality: N/A;  . Balloon dilation  06/12/2012    Procedure: BALLOON DILATION;  Surgeon: Lafayette Dragon, MD;  Location: [facility];  Service: Endoscopy;  Laterality: N/A;  ?balloon  .  Esophagogastroduodenoscopy  09/10/2012    Procedure: ESOPHAGOGASTRODUODENOSCOPY (EGD);  Surgeon: Lafayette Dragon, MD;  Location: [facility];  Service: Endoscopy;  Laterality: N/A;  . Hot hemostasis  09/10/2012    Procedure: HOT HEMOSTASIS (ARGON PLASMA COAGULATION/BICAP);  Surgeon: Lafayette Dragon, MD;  Location: [facility];  Service: Endoscopy;  Laterality: N/A;  . Esophagogastroduodenoscopy N/A 01/18/2013    Procedure: ESOPHAGOGASTRODUODENOSCOPY (EGD);  Surgeon: Lafayette Dragon, MD;  Location: [facility] ENDOSCOPY;  Service: Endoscopy;  Laterality: N/A;  . Esophagogastroduodenoscopy N/A 05/24/2013     Procedure: ESOPHAGOGASTRODUODENOSCOPY (EGD);  Surgeon: Jerene Bears, MD;  Location: [place];  Service: Gastroenterology;  Laterality: N/A;  . Hot hemostasis N/A 05/24/2013    Procedure: HOT HEMOSTASIS (ARGON PLASMA COAGULATION/BICAP);  Surgeon: Jerene Bears, MD;  Location: [place];  Service: Gastroenterology;  Laterality: N/A;  . US echocardiography  07/2014    mild LVH, EF 60-65%, normal wall motion, mild AR, mod dilated LA, mildly dilated RA, peak PA pressure 22mmHg  . Esophagogastroduodenoscopy N/A 10/20/2014    Procedure: ESOPHAGOGASTRODUODENOSCOPY (EGD);  Surgeon: Lafayette Dragon, MD;  Location: [facility];  Service: Endoscopy;  Laterality: N/A;  . Hot hemostasis N/A 10/20/2014    Procedure: HOT HEMOSTASIS (ARGON PLASMA COAGULATION/BICAP);  Surgeon: Lafayette Dragon, MD;  Location: [facility];  Service: Endoscopy;  Laterality: N/A;  . Enteroscopy N/A 02/25/2015    Procedure: ENTEROSCOPY;  Surgeon: [name] Ada, MD;  Location: [place];  Service: Endoscopy;  Laterality: N/A;  . Enteroscopy N/A 06/27/2015    Procedure: ENTEROSCOPY;  Surgeon: Manus Gunning, MD;  Location: [facility];  Service: Gastroenterology;  Laterality: N/A;  . Eus  09/2015    antral CAVL, ablated; nodular erosive gastritis; mult pancreatic cysts rec rpt CT pancreas protocol or MRI to survey pancreas cysts 1-2 yrs (Dr Amie Critchley)   Family History  Problem Relation Age of Onset  . Heart disease Mother   . Cervical cancer Mother   . Kidney disease Mother   . Diabetes Mother   . Breast cancer Sister   . Multiple sclerosis Sister   . Colon cancer Neg Hx   . Breast cancer Maternal Aunt     x 2  . Diabetes Father   . Diabetes Sister   . Multiple sclerosis Other   . Heart attack Mother   . Heart attack Father   . Stroke Daughter   . Stroke Daughter    Social History  Substance Use Topics  . Smoking status: Former Smoker -- 1.50 packs/day for 25 years    Types: Cigarettes    Quit date:  08/26/1992  . Smokeless tobacco: Never Used  . Alcohol Use: No     Comment: [id number] "last alcoholic drink was years ago"   OB History    No data available     Review of Systems  All other systems reviewed and are negative.     Allergies  Acetaminophen; Aspirin; Theophylline; Nsaids; Penicillin g; and Penicillins  Home Medications   Prior to Admission medications   Medication Sig Start Date End Date Taking? Authorizing Provider  albuterol (PROAIR HFA) 108 (90 BASE) MCG/ACT inhaler Inhale 2 puffs into the lungs every 6 (six) hours as needed for wheezing or shortness of breath. Reported on 08/18/2015   Yes Historical Provider, MD  B Complex-C ([facility] B COMPLEX PLUS C) TABS TAKE ONE CAPSULE BY MOUTH EVERY DAY 04/17/15  Yes Ria Bush, MD  darbepoetin (ARANESP) 200 MCG/0.4ML SOLN injection Inject 200 mcg into the skin every  14 (fourteen) days.   Yes Historical Provider, MD  desvenlafaxine (PRISTIQ) 50 MG 24 hr tablet Take 3 tablets (150 mg total) by mouth daily. 05/12/15  Yes Ria Bush, MD  guaiFENesin (MUCINEX) 600 MG 12 hr tablet Take 600 mg by mouth 2 (two) times daily.    Yes Historical Provider, MD  Insulin Glargine (LANTUS SOLOSTAR) 100 UNIT/ML Solostar Pen Inject 120 Units into the skin every morning. 04/13/15  Yes Renato Shin, MD  insulin lispro (HUMALOG KWIKPEN) 100 UNIT/ML KiwkPen Inject 10 Units into the skin daily with supper.    Yes Historical Provider, MD  levalbuterol Penne Lash) 0.63 MG/3ML nebulizer solution Take 0.63 mg by nebulization every 8 (eight) hours as needed for wheezing or shortness of breath.  06/05/14  Yes Historical Provider, MD  loratadine (CLARITIN) 10 MG tablet TAKE 1 TABLET BY MOUTH EVERY DAY 07/14/15  Yes Ria Bush, MD  Multiple Vitamins-Minerals (CENTRUM SILVER PO) Take 1 tablet by mouth daily.    Yes Historical Provider, MD  Saline (OCEAN NASAL SPRAY NA) Place 1 spray into the nose 3 (three) times daily as needed (congestion).    Yes  Historical Provider, MD  ADVAIR DISKUS 250-50 MCG/DOSE AEPB INHALE 1 PUFF INTO LUNGS TWICE A DAY 05/29/15   Ria Bush, MD  ALPRAZolam Duanne Moron) 1 MG tablet Take 1 tablet (1 mg total) by mouth 2 (two) times daily as needed for anxiety or sleep. 10/17/15   Ria Bush, MD  buPROPion (WELLBUTRIN XL) 150 MG 24 hr tablet Take 150 mg by mouth daily.    Historical Provider, MD  clotrimazole (LOTRIMIN) 1 % cream Apply 1 application topically 2 (two) times daily as needed (SKIN IRRITATION).    Historical Provider, MD  diphenhydrAMINE-zinc acetate (BENADRYL) cream Apply 1 application topically 3 (three) times daily as needed for itching.    Historical Provider, MD  ferrous sulfate 325 (65 FE) MG tablet Take 1 tablet (325 mg total) by mouth daily with breakfast. 06/15/15   Manus Gunning, MD  furosemide (LASIX) 80 MG tablet Take one and one-half tablet by mouth twice a day 10/27/15   Sherren Mocha, MD  glucose blood (ONE TOUCH ULTRA TEST) test strip Check blood sugar 4 times daily 10/16/15   Ria Bush, MD  Insulin Pen Needle (B-D ULTRAFINE III SHORT PEN) 31G X 8 MM MISC Use as directed to inject insulin twice daily. Dx: E11.8 10/13/15   Renato Shin, MD  lactulose, encephalopathy, (CHRONULAC) 10 GM/15ML SOLN Take 20 g by mouth 2 (two) times daily. I have instructed pt to begin taking 30 ml of this at least once a day and if she can tolerate a second dose without too many BMs to take a 2nd dose    Historical Provider, MD  metolazone (ZAROXOLYN) 2.5 MG tablet Take as directed for 4 lb weight gain 10/27/15   Sherren Mocha, MD  mupirocin cream (BACTROBAN) 2 % Apply 1 application topically 2 (two) times daily.    Historical Provider, MD  oxycodone (OXY-IR) 5 MG capsule Take 1 capsule (5 mg total) by mouth 2 (two) times daily as needed for pain. 10/17/15   Ria Bush, MD  pantoprazole (PROTONIX) 40 MG tablet TAKE 1 TABLET BY MOUTH TWICE A DAY 10/12/15   Ria Bush, MD  polyethylene glycol  [facility] / GLYCOLAX) packet Take 17 g by mouth daily as needed for mild constipation. Reported on 09/14/2015    Historical Provider, MD  promethazine (PHENERGAN) 25 MG tablet Take 25 mg by mouth 2 (two)  times daily as needed for nausea or vomiting.    Historical Provider, MD  QUEtiapine (SEROQUEL) 200 MG tablet TAKE 1 TABLET BY MOUTH AT BEDTIME 08/15/15   Ria Bush, MD  rifaximin (XIFAXAN) 550 MG TABS tablet Take 550 mg by mouth 2 (two) times daily. Reported on 10/17/2015    Historical Provider, MD  rOPINIRole (REQUIP) 4 MG tablet Take 4 mg by mouth at bedtime.    Historical Provider, MD  simvastatin (ZOCOR) 40 MG tablet Take 40 mg by mouth daily.    Historical Provider, MD  Vitamin D, Ergocalciferol, (DRISDOL) 50000 units CAPS capsule Take 50,000 Units by mouth every 7 (seven) days.    Historical Provider, MD  Vitamin D, Ergocalciferol, (DRISDOL) 50000 units CAPS capsule TAKE ONE CAPSULE WEEKLY 10/23/15   Ria Bush, MD   BP 150/66 mmHg  Pulse 73  Temp(Src) 98.1 F (36.7 C) (Oral)  Resp 16  SpO2 97% Physical Exam  Constitutional: She is oriented to person, place, and time.  Nontoxic-appearing  HENT:  Head: Normocephalic and atraumatic.  Eyes: Conjunctivae and EOM are normal. Pupils are equal, round, and reactive to light.  Neck: Normal range of motion. Neck supple.  Cardiovascular: Normal rate and regular rhythm.   Pulmonary/Chest: Effort normal and breath sounds normal.  Abdominal: Soft. Bowel sounds are normal.  Musculoskeletal: Normal range of motion.  Neurological: She is alert and oriented to person, place, and time.  Skin: Skin is warm and dry.  Psychiatric: She has a normal mood and affect. Her behavior is normal.  Nursing note and vitals reviewed.   ED Course  Procedures (including critical care time) Labs Review Labs Reviewed  CBC - Abnormal; Notable for the following:    WBC 3.3 (*)    RBC 2.70 (*)    Hemoglobin 8.9 (*)    HCT 28.2 (*)    MCV 104.4 (*)     RDW 16.8 (*)    Platelets 63 (*)    All other components within normal limits  URINALYSIS, ROUTINE W REFLEX MICROSCOPIC (NOT AT [facility]) - Abnormal; Notable for the following:    APPearance TURBID (*)    Glucose, UA 100 (*)    Hgb urine dipstick TRACE (*)    Nitrite POSITIVE (*)    Leukocytes, UA LARGE (*)    All other components within normal limits  COMPREHENSIVE METABOLIC PANEL - Abnormal; Notable for the following:    Glucose, Bld 216 (*)    BUN 29 (*)    Creatinine, Ser 1.30 (*)    Total Protein 6.0 (*)    Albumin 3.2 (*)    Total Bilirubin 1.3 (*)    GFR calc non Af Amer 40 (*)    GFR calc Af Amer 46 (*)    All other components within normal limits  URINE MICROSCOPIC-ADD ON - Abnormal; Notable for the following:    Squamous Epithelial / LPF 0-5 (*)    Bacteria, UA MANY (*)    All other components within normal limits  CBG MONITORING, ED - Abnormal; Notable for the following:    Glucose-Capillary 169 (*)    All other components within normal limits  URINE CULTURE    Imaging Review No results found. I have personally reviewed and evaluated these images and lab results as part of my medical decision-making.   EKG Interpretation None      MDM   Final diagnoses:  UTI (lower urinary tract infection)  Hyperglycemia    Patient is in no acute distress.  Serum glucose was 216. Urinalysis shows evidence of infection. Will culture urine. Discharge medications Septra DS. Patient has primary care follow-up.    Nat Christen, MD 10/27/15 [phone]

## 2015-10-31 DIAGNOSIS — G2581 Restless legs syndrome: Secondary | ICD-10-CM | POA: Diagnosis not present

## 2015-10-31 DIAGNOSIS — I5033 Acute on chronic diastolic (congestive) heart failure: Secondary | ICD-10-CM | POA: Diagnosis not present

## 2015-10-31 DIAGNOSIS — E1122 Type 2 diabetes mellitus with diabetic chronic kidney disease: Secondary | ICD-10-CM | POA: Diagnosis not present

## 2015-10-31 DIAGNOSIS — M810 Age-related osteoporosis without current pathological fracture: Secondary | ICD-10-CM | POA: Diagnosis not present

## 2015-10-31 DIAGNOSIS — N183 Chronic kidney disease, stage 3 (moderate): Secondary | ICD-10-CM | POA: Diagnosis not present

## 2015-10-31 DIAGNOSIS — J441 Chronic obstructive pulmonary disease with (acute) exacerbation: Secondary | ICD-10-CM | POA: Diagnosis not present

## 2015-10-31 DIAGNOSIS — I130 Hypertensive heart and chronic kidney disease with heart failure and stage 1 through stage 4 chronic kidney disease, or unspecified chronic kidney disease: Secondary | ICD-10-CM | POA: Diagnosis not present

## 2015-10-31 DIAGNOSIS — K754 Autoimmune hepatitis: Secondary | ICD-10-CM | POA: Diagnosis not present

## 2015-10-31 DIAGNOSIS — S2242XD Multiple fractures of ribs, left side, subsequent encounter for fracture with routine healing: Secondary | ICD-10-CM | POA: Diagnosis not present

## 2015-10-31 DIAGNOSIS — L281 Prurigo nodularis: Secondary | ICD-10-CM | POA: Diagnosis not present

## 2015-10-31 DIAGNOSIS — S2220XD Unspecified fracture of sternum, subsequent encounter for fracture with routine healing: Secondary | ICD-10-CM | POA: Diagnosis not present

## 2015-10-31 DIAGNOSIS — I2510 Atherosclerotic heart disease of native coronary artery without angina pectoris: Secondary | ICD-10-CM | POA: Diagnosis not present

## 2015-10-31 DIAGNOSIS — M797 Fibromyalgia: Secondary | ICD-10-CM | POA: Diagnosis not present

## 2015-11-01 ENCOUNTER — Encounter (HOSPITAL_COMMUNITY): Payer: Self-pay | Admitting: Vascular Surgery

## 2015-11-01 ENCOUNTER — Other Ambulatory Visit: Payer: Self-pay

## 2015-11-01 ENCOUNTER — Emergency Department (HOSPITAL_COMMUNITY): Payer: Medicare Other

## 2015-11-01 ENCOUNTER — Inpatient Hospital Stay (HOSPITAL_COMMUNITY)
Admission: EM | Admit: 2015-11-01 | Discharge: 2015-11-06 | DRG: 291 | Disposition: A | Discharge status: Home / Self Care | Payer: Medicare Other | Attending: Internal Medicine | Admitting: Internal Medicine

## 2015-11-01 ENCOUNTER — Encounter (HOSPITAL_COMMUNITY)
Admission: RE | Admit: 2015-11-01 | Discharge: 2015-11-01 | Disposition: A | Discharge status: Home / Self Care | Payer: Medicare Other | Source: Ambulatory Visit | Attending: Pulmonary Disease | Admitting: Pulmonary Disease

## 2015-11-01 DIAGNOSIS — K754 Autoimmune hepatitis: Secondary | ICD-10-CM | POA: Diagnosis present

## 2015-11-01 DIAGNOSIS — Q2733 Arteriovenous malformation of digestive system vessel: Secondary | ICD-10-CM | POA: Diagnosis not present

## 2015-11-01 DIAGNOSIS — J449 Chronic obstructive pulmonary disease, unspecified: Secondary | ICD-10-CM | POA: Diagnosis not present

## 2015-11-01 DIAGNOSIS — F419 Anxiety disorder, unspecified: Secondary | ICD-10-CM | POA: Diagnosis present

## 2015-11-01 DIAGNOSIS — Z515 Encounter for palliative care: Secondary | ICD-10-CM | POA: Diagnosis not present

## 2015-11-01 DIAGNOSIS — IMO0001 Reserved for inherently not codable concepts without codable children: Secondary | ICD-10-CM | POA: Diagnosis present

## 2015-11-01 DIAGNOSIS — N289 Disorder of kidney and ureter, unspecified: Secondary | ICD-10-CM | POA: Insufficient documentation

## 2015-11-01 DIAGNOSIS — D61818 Other pancytopenia: Secondary | ICD-10-CM | POA: Diagnosis present

## 2015-11-01 DIAGNOSIS — F333 Major depressive disorder, recurrent, severe with psychotic symptoms: Secondary | ICD-10-CM | POA: Diagnosis present

## 2015-11-01 DIAGNOSIS — D500 Iron deficiency anemia secondary to blood loss (chronic): Secondary | ICD-10-CM | POA: Diagnosis present

## 2015-11-01 DIAGNOSIS — I130 Hypertensive heart and chronic kidney disease with heart failure and stage 1 through stage 4 chronic kidney disease, or unspecified chronic kidney disease: Secondary | ICD-10-CM | POA: Diagnosis not present

## 2015-11-01 DIAGNOSIS — Z886 Allergy status to analgesic agent status: Secondary | ICD-10-CM | POA: Diagnosis not present

## 2015-11-01 DIAGNOSIS — I2510 Atherosclerotic heart disease of native coronary artery without angina pectoris: Secondary | ICD-10-CM | POA: Diagnosis present

## 2015-11-01 DIAGNOSIS — J441 Chronic obstructive pulmonary disease with (acute) exacerbation: Secondary | ICD-10-CM | POA: Diagnosis not present

## 2015-11-01 DIAGNOSIS — I440 Atrioventricular block, first degree: Secondary | ICD-10-CM | POA: Diagnosis present

## 2015-11-01 DIAGNOSIS — N183 Chronic kidney disease, stage 3 unspecified: Secondary | ICD-10-CM | POA: Diagnosis present

## 2015-11-01 DIAGNOSIS — E669 Obesity, unspecified: Secondary | ICD-10-CM | POA: Diagnosis present

## 2015-11-01 DIAGNOSIS — R102 Pelvic and perineal pain: Secondary | ICD-10-CM | POA: Diagnosis present

## 2015-11-01 DIAGNOSIS — K7460 Unspecified cirrhosis of liver: Secondary | ICD-10-CM | POA: Diagnosis not present

## 2015-11-01 DIAGNOSIS — I10 Essential (primary) hypertension: Secondary | ICD-10-CM | POA: Diagnosis present

## 2015-11-01 DIAGNOSIS — E785 Hyperlipidemia, unspecified: Secondary | ICD-10-CM | POA: Diagnosis not present

## 2015-11-01 DIAGNOSIS — Z79899 Other long term (current) drug therapy: Secondary | ICD-10-CM

## 2015-11-01 DIAGNOSIS — I129 Hypertensive chronic kidney disease with stage 1 through stage 4 chronic kidney disease, or unspecified chronic kidney disease: Secondary | ICD-10-CM | POA: Diagnosis present

## 2015-11-01 DIAGNOSIS — I509 Heart failure, unspecified: Secondary | ICD-10-CM | POA: Diagnosis not present

## 2015-11-01 DIAGNOSIS — Z87891 Personal history of nicotine dependence: Secondary | ICD-10-CM

## 2015-11-01 DIAGNOSIS — Z6841 Body Mass Index (BMI) 40.0 and over, adult: Secondary | ICD-10-CM

## 2015-11-01 DIAGNOSIS — Z794 Long term (current) use of insulin: Secondary | ICD-10-CM | POA: Diagnosis not present

## 2015-11-01 DIAGNOSIS — N390 Urinary tract infection, site not specified: Secondary | ICD-10-CM | POA: Diagnosis not present

## 2015-11-01 DIAGNOSIS — K7581 Nonalcoholic steatohepatitis (NASH): Secondary | ICD-10-CM | POA: Diagnosis present

## 2015-11-01 DIAGNOSIS — Z888 Allergy status to other drugs, medicaments and biological substances status: Secondary | ICD-10-CM

## 2015-11-01 DIAGNOSIS — E1122 Type 2 diabetes mellitus with diabetic chronic kidney disease: Secondary | ICD-10-CM | POA: Diagnosis not present

## 2015-11-01 DIAGNOSIS — K219 Gastro-esophageal reflux disease without esophagitis: Secondary | ICD-10-CM | POA: Diagnosis not present

## 2015-11-01 DIAGNOSIS — J9811 Atelectasis: Secondary | ICD-10-CM | POA: Diagnosis not present

## 2015-11-01 DIAGNOSIS — D649 Anemia, unspecified: Secondary | ICD-10-CM | POA: Diagnosis not present

## 2015-11-01 DIAGNOSIS — K31819 Angiodysplasia of stomach and duodenum without bleeding: Secondary | ICD-10-CM | POA: Diagnosis present

## 2015-11-01 DIAGNOSIS — Z9981 Dependence on supplemental oxygen: Secondary | ICD-10-CM | POA: Diagnosis not present

## 2015-11-01 DIAGNOSIS — I5033 Acute on chronic diastolic (congestive) heart failure: Secondary | ICD-10-CM | POA: Diagnosis not present

## 2015-11-01 DIAGNOSIS — E7800 Pure hypercholesterolemia, unspecified: Secondary | ICD-10-CM | POA: Diagnosis present

## 2015-11-01 DIAGNOSIS — Z880 Allergy status to penicillin: Secondary | ICD-10-CM

## 2015-11-01 DIAGNOSIS — R05 Cough: Secondary | ICD-10-CM | POA: Diagnosis not present

## 2015-11-01 DIAGNOSIS — J9620 Acute and chronic respiratory failure, unspecified whether with hypoxia or hypercapnia: Secondary | ICD-10-CM | POA: Diagnosis present

## 2015-11-01 DIAGNOSIS — J9621 Acute and chronic respiratory failure with hypoxia: Secondary | ICD-10-CM | POA: Diagnosis not present

## 2015-11-01 DIAGNOSIS — I110 Hypertensive heart disease with heart failure: Secondary | ICD-10-CM | POA: Diagnosis not present

## 2015-11-01 DIAGNOSIS — R0602 Shortness of breath: Secondary | ICD-10-CM | POA: Diagnosis not present

## 2015-11-01 HISTORY — DX: Pneumonia, unspecified organism: J18.9

## 2015-11-01 LAB — BASIC METABOLIC PANEL
Anion gap: 11 (ref 5–15)
BUN: 22 mg/dL — AB (ref 6–20)
CO2: 29 mmol/L (ref 22–32)
CREATININE: 1.14 mg/dL — AB (ref 0.44–1.00)
Calcium: 9 mg/dL (ref 8.9–10.3)
Chloride: 98 mmol/L — ABNORMAL LOW (ref 101–111)
GFR calc Af Amer: 54 mL/min — ABNORMAL LOW (ref >60)
GFR, EST NON AFRICAN AMERICAN: 47 mL/min — AB (ref >60)
GLUCOSE: 335 mg/dL — AB (ref 65–99)
POTASSIUM: 3.5 mmol/L (ref 3.5–5.1)
SODIUM: 138 mmol/L (ref 135–145)

## 2015-11-01 LAB — URINALYSIS, ROUTINE W REFLEX MICROSCOPIC
Bilirubin Urine: NEGATIVE
GLUCOSE, UA: NEGATIVE mg/dL
Ketones, ur: NEGATIVE mg/dL
Nitrite: POSITIVE — AB
PH: 7.5 (ref 5.0–8.0)
Protein, ur: NEGATIVE mg/dL
Specific Gravity, Urine: 1.007 (ref 1.005–1.030)

## 2015-11-01 LAB — GLUCOSE, CAPILLARY: GLUCOSE-CAPILLARY: 120 mg/dL — AB (ref 65–99)

## 2015-11-01 LAB — CBC
HCT: 26.6 % — ABNORMAL LOW (ref 36.0–46.0)
Hemoglobin: 8.4 g/dL — ABNORMAL LOW (ref 12.0–15.0)
MCH: 31.5 pg (ref 26.0–34.0)
MCHC: 31.6 g/dL (ref 30.0–36.0)
MCV: 99.6 fL (ref 78.0–100.0)
PLATELETS: 73 10*3/uL — AB (ref 150–400)
RBC: 2.67 MIL/uL — ABNORMAL LOW (ref 3.87–5.11)
RDW: 16.7 % — AB (ref 11.5–15.5)
WBC: 3.3 10*3/uL — ABNORMAL LOW (ref 4.0–10.5)

## 2015-11-01 LAB — PROTIME-INR
INR: 1.29 (ref 0.00–1.49)
PROTHROMBIN TIME: 16.2 s — AB (ref 11.6–15.2)

## 2015-11-01 LAB — URINE MICROSCOPIC-ADD ON

## 2015-11-01 LAB — APTT: aPTT: 30 seconds (ref 24–37)

## 2015-11-01 LAB — TROPONIN I: Troponin I: 0.04 ng/mL — ABNORMAL HIGH (ref <0.031)

## 2015-11-01 LAB — I-STAT TROPONIN, ED: TROPONIN I, POC: 0.03 ng/mL (ref 0.00–0.08)

## 2015-11-01 LAB — POCT HEMOGLOBIN-HEMACUE: Hemoglobin: 8.1 g/dL — ABNORMAL LOW (ref 12.0–15.0)

## 2015-11-01 LAB — BRAIN NATRIURETIC PEPTIDE: B Natriuretic Peptide: 117.3 pg/mL — ABNORMAL HIGH (ref 0.0–100.0)

## 2015-11-01 MED ORDER — SODIUM CHLORIDE 0.9% FLUSH: 3.0000 mL | INTRAVENOUS | Status: DC | PRN

## 2015-11-01 MED ORDER — ALBUTEROL SULFATE (2.5 MG/3ML) 0.083% IN NEBU
2.5000 mg | INHALATION_SOLUTION | RESPIRATORY_TRACT | Status: DC | PRN
  Administered 2015-11-04: 2.5 mg via RESPIRATORY_TRACT
  Filled 2015-11-01: qty 3

## 2015-11-01 MED ORDER — SODIUM CHLORIDE 0.9 % IV SOLN: 250.0000 mL | INTRAVENOUS | Status: DC | PRN

## 2015-11-01 MED ORDER — B COMPLEX-C PO TABS
1.0000 | ORAL_TABLET | Freq: Every day | ORAL | Status: DC
  Administered 2015-11-02 – 2015-11-06 (×5): 1 via ORAL
  Filled 2015-11-01 (×5): qty 1

## 2015-11-01 MED ORDER — MUPIROCIN CALCIUM 2 % EX CREA
1.0000 "application " | TOPICAL_CREAM | Freq: Two times a day (BID) | CUTANEOUS | Status: DC
  Administered 2015-11-03 – 2015-11-06 (×6): 1 via TOPICAL
  Filled 2015-11-01: qty 15

## 2015-11-01 MED ORDER — IPRATROPIUM-ALBUTEROL 0.5-2.5 (3) MG/3ML IN SOLN
3.0000 mL | RESPIRATORY_TRACT | Status: DC
  Administered 2015-11-02: 3 mL via RESPIRATORY_TRACT
  Filled 2015-11-01: qty 3

## 2015-11-01 MED ORDER — BUPROPION HCL ER (XL) 150 MG PO TB24
150.0000 mg | ORAL_TABLET | Freq: Every day | ORAL | Status: DC
  Administered 2015-11-02 – 2015-11-06 (×5): 150 mg via ORAL
  Filled 2015-11-01 (×10): qty 1

## 2015-11-01 MED ORDER — FERROUS SULFATE 325 (65 FE) MG PO TABS
325.0000 mg | ORAL_TABLET | Freq: Every day | ORAL | Status: DC
  Administered 2015-11-02 – 2015-11-05 (×4): 325 mg via ORAL
  Filled 2015-11-01 (×4): qty 1

## 2015-11-01 MED ORDER — GUAIFENESIN ER 600 MG PO TB12
600.0000 mg | ORAL_TABLET | Freq: Two times a day (BID) | ORAL | Status: DC
  Administered 2015-11-01 – 2015-11-06 (×10): 600 mg via ORAL
  Filled 2015-11-01 (×11): qty 1

## 2015-11-01 MED ORDER — FUROSEMIDE 10 MG/ML IJ SOLN
120.0000 mg | Freq: Two times a day (BID) | INTRAVENOUS | Status: DC
  Administered 2015-11-02: 120 mg via INTRAVENOUS
  Filled 2015-11-01 (×2): qty 12

## 2015-11-01 MED ORDER — INSULIN GLARGINE 100 UNIT/ML SOLOSTAR PEN
120.0000 [IU] | PEN_INJECTOR | SUBCUTANEOUS | Status: DC
Start: 2015-11-02 — End: 2015-11-01

## 2015-11-01 MED ORDER — SODIUM CHLORIDE 0.9% FLUSH
3.0000 mL | Freq: Two times a day (BID) | INTRAVENOUS | Status: DC
  Administered 2015-11-01 – 2015-11-06 (×9): 3 mL via INTRAVENOUS

## 2015-11-01 MED ORDER — PANTOPRAZOLE SODIUM 40 MG PO TBEC
40.0000 mg | DELAYED_RELEASE_TABLET | Freq: Two times a day (BID) | ORAL | Status: DC
  Administered 2015-11-01 – 2015-11-06 (×10): 40 mg via ORAL
  Filled 2015-11-01 (×10): qty 1

## 2015-11-01 MED ORDER — VITAMIN D (ERGOCALCIFEROL) 1.25 MG (50000 UNIT) PO CAPS
50000.0000 [IU] | ORAL_CAPSULE | ORAL | Status: DC
  Filled 2015-11-01 (×4): qty 1

## 2015-11-01 MED ORDER — SIMVASTATIN 40 MG PO TABS
40.0000 mg | ORAL_TABLET | Freq: Every day | ORAL | Status: DC
  Administered 2015-11-01 – 2015-11-05 (×5): 40 mg via ORAL
  Filled 2015-11-01 (×5): qty 1

## 2015-11-01 MED ORDER — FUROSEMIDE 10 MG/ML IJ SOLN: 40.0000 mg | Freq: Once | INTRAMUSCULAR | Status: DC

## 2015-11-01 MED ORDER — DESVENLAFAXINE SUCCINATE ER 50 MG PO TB24
150.0000 mg | ORAL_TABLET | Freq: Every day | ORAL | Status: DC
  Administered 2015-11-02 – 2015-11-05 (×4): 150 mg via ORAL
  Filled 2015-11-01 (×10): qty 1

## 2015-11-01 MED ORDER — DARBEPOETIN ALFA 200 MCG/0.4ML IJ SOSY
200.0000 ug | PREFILLED_SYRINGE | INTRAMUSCULAR | Status: DC
  Administered 2015-11-01: 200 ug via SUBCUTANEOUS

## 2015-11-01 MED ORDER — LACTULOSE 10 GM/15ML PO SOLN
20.0000 g | Freq: Every evening | ORAL | Status: DC
  Administered 2015-11-01 – 2015-11-05 (×5): 20 g via ORAL
  Filled 2015-11-01 (×5): qty 30

## 2015-11-01 MED ORDER — ADULT MULTIVITAMIN W/MINERALS CH
1.0000 | ORAL_TABLET | Freq: Every day | ORAL | Status: DC
  Administered 2015-11-02 – 2015-11-05 (×4): 1 via ORAL
  Filled 2015-11-01 (×4): qty 1

## 2015-11-01 MED ORDER — ROPINIROLE HCL 1 MG PO TABS
4.0000 mg | ORAL_TABLET | Freq: Every day | ORAL | Status: DC
  Administered 2015-11-01 – 2015-11-05 (×5): 4 mg via ORAL
  Filled 2015-11-01 (×5): qty 4

## 2015-11-01 MED ORDER — ONDANSETRON HCL 4 MG/2ML IJ SOLN: 4.0000 mg | Freq: Three times a day (TID) | INTRAMUSCULAR | Status: AC | PRN

## 2015-11-01 MED ORDER — INSULIN GLARGINE 100 UNIT/ML ~~LOC~~ SOLN
120.0000 [IU] | Freq: Every day | SUBCUTANEOUS | Status: DC
  Administered 2015-11-02 – 2015-11-06 (×5): 120 [IU] via SUBCUTANEOUS
  Filled 2015-11-01 (×7): qty 1.2

## 2015-11-01 MED ORDER — ZOLPIDEM TARTRATE 5 MG PO TABS
5.0000 mg | ORAL_TABLET | Freq: Every evening | ORAL | Status: DC | PRN
Start: 2015-11-01 — End: 2015-11-06
  Filled 2015-11-01: qty 1

## 2015-11-01 MED ORDER — RIFAXIMIN 550 MG PO TABS
550.0000 mg | ORAL_TABLET | Freq: Two times a day (BID) | ORAL | Status: DC
  Administered 2015-11-01 – 2015-11-06 (×10): 550 mg via ORAL
  Filled 2015-11-01 (×10): qty 1

## 2015-11-01 MED ORDER — INSULIN ASPART 100 UNIT/ML ~~LOC~~ SOLN
0.0000 [IU] | Freq: Three times a day (TID) | SUBCUTANEOUS | Status: DC
  Administered 2015-11-02: 7 [IU] via SUBCUTANEOUS
  Administered 2015-11-02: 10 [IU] via SUBCUTANEOUS
  Administered 2015-11-03: 9 [IU] via SUBCUTANEOUS
  Administered 2015-11-03 – 2015-11-04 (×3): 3 [IU] via SUBCUTANEOUS
  Administered 2015-11-04: 2 [IU] via SUBCUTANEOUS
  Administered 2015-11-04: 1 [IU] via SUBCUTANEOUS
  Administered 2015-11-05 (×2): 5 [IU] via SUBCUTANEOUS
  Administered 2015-11-05: 7 [IU] via SUBCUTANEOUS
  Administered 2015-11-06: 5 [IU] via SUBCUTANEOUS

## 2015-11-01 MED ORDER — QUETIAPINE FUMARATE 25 MG PO TABS
200.0000 mg | ORAL_TABLET | Freq: Every day | ORAL | Status: DC
  Administered 2015-11-01 – 2015-11-05 (×5): 200 mg via ORAL
  Filled 2015-11-01 (×5): qty 8

## 2015-11-01 MED ORDER — CENTRUM SILVER PO TABS: 1.0000 | ORAL_TABLET | Freq: Every day | ORAL | Status: DC

## 2015-11-01 MED ORDER — OXYCODONE HCL 5 MG PO TABS
5.0000 mg | ORAL_TABLET | Freq: Two times a day (BID) | ORAL | Status: DC | PRN
  Administered 2015-11-02 – 2015-11-04 (×6): 5 mg via ORAL
  Filled 2015-11-01 (×6): qty 1

## 2015-11-01 MED ORDER — ALPRAZOLAM 0.5 MG PO TABS
1.0000 mg | ORAL_TABLET | Freq: Two times a day (BID) | ORAL | Status: DC | PRN
  Administered 2015-11-01 – 2015-11-05 (×9): 1 mg via ORAL
  Filled 2015-11-01 (×9): qty 2

## 2015-11-01 MED ORDER — AZITHROMYCIN 250 MG PO TABS
250.0000 mg | ORAL_TABLET | Freq: Every day | ORAL | Status: AC
  Administered 2015-11-02 – 2015-11-05 (×4): 250 mg via ORAL
  Filled 2015-11-01 (×8): qty 1

## 2015-11-01 MED ORDER — AZITHROMYCIN 500 MG PO TABS
500.0000 mg | ORAL_TABLET | Freq: Every day | ORAL | Status: AC
  Administered 2015-11-02: 500 mg via ORAL
  Filled 2015-11-01: qty 1

## 2015-11-01 MED ORDER — METHYLPREDNISOLONE SODIUM SUCC 125 MG IJ SOLR
60.0000 mg | Freq: Two times a day (BID) | INTRAMUSCULAR | Status: DC
  Administered 2015-11-01 – 2015-11-02 (×2): 60 mg via INTRAVENOUS
  Filled 2015-11-01 (×2): qty 2

## 2015-11-01 MED ORDER — CVS B COMPLEX PLUS C PO TABS: 1.0000 | ORAL_TABLET | Freq: Every day | ORAL | Status: DC

## 2015-11-01 MED ORDER — FUROSEMIDE 10 MG/ML IJ SOLN
80.0000 mg | Freq: Once | INTRAMUSCULAR | Status: AC
  Administered 2015-11-01: 80 mg via INTRAVENOUS
  Filled 2015-11-01: qty 8

## 2015-11-01 MED ORDER — FUROSEMIDE 10 MG/ML IJ SOLN
40.0000 mg | Freq: Once | INTRAMUSCULAR | Status: AC
  Administered 2015-11-01: 40 mg via INTRAVENOUS
  Filled 2015-11-01: qty 4

## 2015-11-01 MED ORDER — LORATADINE 10 MG PO TABS
10.0000 mg | ORAL_TABLET | Freq: Every day | ORAL | Status: DC
  Administered 2015-11-02 – 2015-11-06 (×5): 10 mg via ORAL
  Filled 2015-11-01 (×5): qty 1

## 2015-11-01 MED ORDER — DARBEPOETIN ALFA 200 MCG/0.4ML IJ SOSY
PREFILLED_SYRINGE | INTRAMUSCULAR | Status: AC
  Filled 2015-11-01: qty 0.4

## 2015-11-01 NOTE — ED Notes (Signed)
Hospitalist at the bedside 

## 2015-11-01 NOTE — ED Provider Notes (Signed)
CSN: [id number]     Arrival date & time 11/01/15  1215 History   First MD Initiated Contact with Patient 11/01/15 1848     Chief Complaint  Patient presents with  . Leg Swelling     (Consider location/radiation/quality/duration/timing/severity/associated sxs/prior Treatment) HPI Comments: [name] is a 75 y.o. female who presents today with a 1 day history of leg edema and shortness of breath. The edema has been moderate-severe. Right leg has been edematous since last hospital admission in January, but left leg and abdomen became swollen overnight.Symptoms rapidly worsening since onset. Patient has gained 12 pounds in the last month. The edema is present all day. Patient has taken 120mg  of Lasix today without relief. Urine output is considerable, however less than normal according to the patient. Patient has had worsening shortness of breath over the past 2-3 weeks. Continuous 3L O2 at home. Sleeps on incline. Wakes up in middle of night, does not know if it is due to PND. Patient endorses dizziness and weakness in her legs. Associated cough with clear/light green sputum, occasionally blood. Reports pain in lower abdomen when attempting to have BM or urinate. Patient also notes weakness in her legs which she associates with a similar feeling she had before she received a blood transfusion on last admission.     Past Medical History  Diagnosis Date  . Chronic airway obstruction, not elsewhere classified   . Unspecified chronic bronchitis ([place])   . Unspecified essential hypertension   . Non-obstructive CAD   . Palpitations   . Mild aortic stenosis     a. 03/2014 Valve area (VTI): 2.89 cm^2, Valve area (Vmax): 2.7 cm^2.  . Pure hypercholesterolemia   . Morbid obesity ([place])   . Esophageal reflux   . GAVE (gastric antral vascular ectasia)     angiodysplasia  . Diverticulosis of colon (without mention of hemorrhage)   . Benign neoplasm of colon   . Osteoarthrosis, unspecified whether  generalized or localized, unspecified site   . Osteoporosis, unspecified   . Restless legs syndrome (RLS)   . Major depressive disorder, recurrent episode, severe, specified as with psychotic behavior   . Iron deficiency anemia secondary to blood loss (chronic)     a. frequent PRBC transfusions.  . Coarse tremors     a. arms.  . Carpal tunnel syndrome on both sides   . Chronic diastolic CHF (congestive heart failure) ([place])     a. 03/26/2014 Echo: EF 55-60%, no rwma, mild AS/AI, mod-sev Ca2+ MV annulus, mildly to mod dil LA.  [name] Asthma   . Type II diabetes mellitus ([place])   . H/O hiatal hernia   . 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 dx'd 1990's  . Adenomatous colon polyp   . Midsternal chest pain     a. conservatively managed ->poor candidate for cath/anticoagulation.  [name] Anxiety   . Portal hypertensive gastropathy   . Falls frequently     completed HHPT/OT 06/2014, PT unmet goals  . Recurrent UTI (urinary tract infection)     h/o hospitalization with urosepsis 2015, but thought large component colonization/bacteriuria, only treat if symptomatic ([place])  . Hiatal hernia   . Thrombocytopenia ([place])   . Protein calorie malnutrition ([place])   . History of pneumonia     CAP 2015  . On home oxygen therapy     "3L; 24/7" (01/25/2015)  . GAVE (gastric antral vascular ectasia)     s/p multiple APCs, discussing RFA with  [facility] GI Dr Newman Pies (06/2015)  . OSA (obstructive sleep apnea)     a. does not use CPAP. (01/25/2015)  . Hepatitis    Past Surgical History  Procedure Laterality Date  . Hemorroidectomy    . Appendectomy    . Vaginal hysterectomy    . Breast biopsy      bilateral  . Cesarean section      x 3  . Appendectomy    . Esophagogastroduodenoscopy  06/12/2012    Procedure: ESOPHAGOGASTRODUODENOSCOPY (EGD);  Surgeon: Lafayette Dragon, MD;  Location: [facility];  Service: Endoscopy;  Laterality: N/A;  . Balloon dilation  06/12/2012     Procedure: BALLOON DILATION;  Surgeon: Lafayette Dragon, MD;  Location: [facility];  Service: Endoscopy;  Laterality: N/A;  ?balloon  . Esophagogastroduodenoscopy  09/10/2012    Procedure: ESOPHAGOGASTRODUODENOSCOPY (EGD);  Surgeon: Lafayette Dragon, MD;  Location: [facility];  Service: Endoscopy;  Laterality: N/A;  . Hot hemostasis  09/10/2012    Procedure: HOT HEMOSTASIS (ARGON PLASMA COAGULATION/BICAP);  Surgeon: Lafayette Dragon, MD;  Location: [facility];  Service: Endoscopy;  Laterality: N/A;  . Esophagogastroduodenoscopy N/A 01/18/2013    Procedure: ESOPHAGOGASTRODUODENOSCOPY (EGD);  Surgeon: Lafayette Dragon, MD;  Location: [facility] ENDOSCOPY;  Service: Endoscopy;  Laterality: N/A;  . Esophagogastroduodenoscopy N/A 05/24/2013    Procedure: ESOPHAGOGASTRODUODENOSCOPY (EGD);  Surgeon: Jerene Bears, MD;  Location: [place];  Service: Gastroenterology;  Laterality: N/A;  . Hot hemostasis N/A 05/24/2013    Procedure: HOT HEMOSTASIS (ARGON PLASMA COAGULATION/BICAP);  Surgeon: Jerene Bears, MD;  Location: [place];  Service: Gastroenterology;  Laterality: N/A;  . US echocardiography  07/2014    mild LVH, EF 60-65%, normal wall motion, mild AR, mod dilated LA, mildly dilated RA, peak PA pressure 20mmHg  . Esophagogastroduodenoscopy N/A 10/20/2014    Procedure: ESOPHAGOGASTRODUODENOSCOPY (EGD);  Surgeon: Lafayette Dragon, MD;  Location: [facility];  Service: Endoscopy;  Laterality: N/A;  . Hot hemostasis N/A 10/20/2014    Procedure: HOT HEMOSTASIS (ARGON PLASMA COAGULATION/BICAP);  Surgeon: Lafayette Dragon, MD;  Location: [facility];  Service: Endoscopy;  Laterality: N/A;  . Enteroscopy N/A 02/25/2015    Procedure: ENTEROSCOPY;  Surgeon: [name] Ada, MD;  Location: [place];  Service: Endoscopy;  Laterality: N/A;  . Enteroscopy N/A 06/27/2015    Procedure: ENTEROSCOPY;  Surgeon: Manus Gunning, MD;  Location: [facility];  Service: Gastroenterology;  Laterality: N/A;  . Eus  09/2015    antral  CAVL, ablated; nodular erosive gastritis; mult pancreatic cysts rec rpt CT pancreas protocol or MRI to survey pancreas cysts 1-2 yrs (Dr Amie Critchley)   Family History  Problem Relation Age of Onset  . Heart disease Mother   . Cervical cancer Mother   . Kidney disease Mother   . Diabetes Mother   . Breast cancer Sister   . Multiple sclerosis Sister   . Colon cancer Neg Hx   . Breast cancer Maternal Aunt     x 2  . Diabetes Father   . Diabetes Sister   . Multiple sclerosis Other   . Heart attack Mother   . Heart attack Father   . Stroke Daughter   . Stroke Daughter    Social History  Substance Use Topics  . Smoking status: Former Smoker -- 1.50 packs/day for 25 years    Types: Cigarettes    Quit date: 08/26/1992  . Smokeless tobacco: Never Used  . Alcohol Use: No     Comment: 11/22/11 "last  alcoholic drink was years ago"   OB History    No data available     Review of Systems  Constitutional: Negative for fever and chills.  HENT: Negative for facial swelling and sore throat.   Respiratory: Positive for cough and shortness of breath. Negative for stridor.   Cardiovascular: Positive for leg swelling. Negative for chest pain.  Gastrointestinal: Positive for nausea and vomiting. Negative for abdominal pain.  Genitourinary: Negative for dysuria.  Musculoskeletal: Positive for neck pain (Patient states since fall in December). Negative for back pain.  Skin: Negative for rash and wound.  Neurological: Positive for dizziness and weakness (legs). Negative for headaches.  Psychiatric/Behavioral: The patient is not nervous/anxious.       Allergies  Acetaminophen; Nsaids; Aspirin; Theophylline; Penicillin g; and Penicillins  Home Medications   Prior to Admission medications   Medication Sig Start Date End Date Taking? Authorizing Provider  ADVAIR DISKUS 250-50 MCG/DOSE AEPB INHALE 1 PUFF INTO LUNGS TWICE A DAY 05/29/15  Yes Ria Bush, MD  albuterol (PROAIR HFA) 108 (90  BASE) MCG/ACT inhaler Inhale 2 puffs into the lungs every 6 (six) hours as needed for wheezing or shortness of breath. Reported on 08/18/2015   Yes Historical Provider, MD  ALPRAZolam Duanne Moron) 1 MG tablet Take 1 tablet (1 mg total) by mouth 2 (two) times daily as needed for anxiety or sleep. 10/17/15  Yes Ria Bush, MD  B Complex-C ([employer] B COMPLEX PLUS C) TABS TAKE ONE CAPSULE BY MOUTH EVERY DAY 04/17/15  Yes Ria Bush, MD  buPROPion (WELLBUTRIN XL) 150 MG 24 hr tablet Take 150 mg by mouth daily.   Yes Historical Provider, MD  desvenlafaxine (PRISTIQ) 50 MG 24 hr tablet Take 3 tablets (150 mg total) by mouth daily. 05/12/15  Yes Ria Bush, MD  ferrous sulfate 325 (65 FE) MG tablet Take 1 tablet (325 mg total) by mouth daily with breakfast. 06/15/15  Yes Manus Gunning, MD  furosemide (LASIX) 80 MG tablet Take one and one-half tablet by mouth twice a day Patient taking differently: Take 120 mg by mouth 2 (two) times daily. Take one and one-half tablet by mouth twice a day 10/27/15  Yes Sherren Mocha, MD  guaiFENesin (MUCINEX) 600 MG 12 hr tablet Take 600 mg by mouth 2 (two) times daily.    Yes Historical Provider, MD  Insulin Glargine (LANTUS SOLOSTAR) 100 UNIT/ML Solostar Pen Inject 120 Units into the skin every morning. 04/13/15  Yes Renato Shin, MD  insulin lispro (HUMALOG KWIKPEN) 100 UNIT/ML KiwkPen Inject 10 Units into the skin daily with supper.    Yes Historical Provider, MD  lactulose, encephalopathy, (CHRONULAC) 10 GM/15ML SOLN Take 20 g by mouth every evening. I have instructed pt to begin taking 30 ml of this at least once a day and if she can tolerate a second dose without too many BMs to take a 2nd dose   Yes Historical Provider, MD  levalbuterol (XOPENEX) 0.63 MG/3ML nebulizer solution Take 0.63 mg by nebulization every 8 (eight) hours as needed for wheezing or shortness of breath.  06/05/14  Yes Historical Provider, MD  loratadine (CLARITIN) 10 MG tablet TAKE 1  TABLET BY MOUTH EVERY DAY 07/14/15  Yes Ria Bush, MD  Multiple Vitamins-Minerals (CENTRUM SILVER PO) Take 1 tablet by mouth daily.    Yes Historical Provider, MD  mupirocin cream (BACTROBAN) 2 % Apply 1 application topically 2 (two) times daily.   Yes Historical Provider, MD  oxycodone (OXY-IR) 5 MG capsule Take 1  capsule (5 mg total) by mouth 2 (two) times daily as needed for pain. 10/17/15  Yes Ria Bush, MD  pantoprazole (PROTONIX) 40 MG tablet TAKE 1 TABLET BY MOUTH TWICE A DAY 10/12/15  Yes Ria Bush, MD  promethazine (PHENERGAN) 25 MG tablet Take 25 mg by mouth 2 (two) times daily as needed for nausea or vomiting.   Yes Historical Provider, MD  QUEtiapine (SEROQUEL) 200 MG tablet TAKE 1 TABLET BY MOUTH AT BEDTIME 08/15/15  Yes Ria Bush, MD  rOPINIRole (REQUIP) 4 MG tablet Take 4 mg by mouth at bedtime.   Yes Historical Provider, MD  simvastatin (ZOCOR) 40 MG tablet Take 40 mg by mouth daily.   Yes Historical Provider, MD  Vitamin D, Ergocalciferol, (DRISDOL) 50000 units CAPS capsule TAKE ONE CAPSULE WEEKLY Patient taking differently: TAKE ONE CAPSULE WEEKLY ON FRIDAY 10/23/15  Yes Ria Bush, MD  glucose blood (ONE TOUCH ULTRA TEST) test strip Check blood sugar 4 times daily 10/16/15   Ria Bush, MD  Insulin Pen Needle (B-D ULTRAFINE III SHORT PEN) 31G X 8 MM MISC Use as directed to inject insulin twice daily. Dx: E11.8 10/13/15   Renato Shin, MD  metolazone (ZAROXOLYN) 2.5 MG tablet Take as directed for 4 lb weight gain 10/27/15   Sherren Mocha, MD  rifaximin (XIFAXAN) 550 MG TABS tablet Take 550 mg by mouth 2 (two) times daily. Reported on 10/17/2015    Historical Provider, MD   BP 146/46 mmHg  Pulse 73  Temp(Src) 98.9 F (37.2 C) (Oral)  Resp 23  Wt 104.327 kg  SpO2 99% Physical Exam  Constitutional: She appears well-developed and well-nourished. No distress.  HENT:  Head: Normocephalic and atraumatic.  Eyes: Conjunctivae are normal. Right eye  exhibits no discharge. Left eye exhibits no discharge. No scleral icterus.  Neck: Normal range of motion. Neck supple. No JVD present.  Cardiovascular: Normal rate, regular rhythm and intact distal pulses.  Exam reveals no gallop and no friction rub.   Murmur heard. Pulmonary/Chest: Effort normal and breath sounds normal. No stridor. No respiratory distress. She has no wheezes. She has no rales.  Abdominal: Soft. Bowel sounds are normal. She exhibits no distension. There is no tenderness. There is no rebound and no guarding.  Musculoskeletal: She exhibits edema.  2+ pitting edema  Lymphadenopathy:    She has no cervical adenopathy.  Neurological: She is alert. Coordination normal.  Skin: Skin is warm and dry. No rash noted. She is not diaphoretic. No pallor.  Psychiatric: She has a normal mood and affect.  Nursing note and vitals reviewed.   ED Course  Procedures (including critical care time) Labs Review Labs Reviewed  BASIC METABOLIC PANEL - Abnormal; Notable for the following:    Chloride 98 (*)    Glucose, Bld 335 (*)    BUN 22 (*)    Creatinine, Ser 1.14 (*)    GFR calc non Af Amer 47 (*)    GFR calc Af Amer 54 (*)    All other components within normal limits  CBC - Abnormal; Notable for the following:    WBC 3.3 (*)    RBC 2.67 (*)    Hemoglobin 8.4 (*)    HCT 26.6 (*)    RDW 16.7 (*)    Platelets 73 (*)    All other components within normal limits  BRAIN NATRIURETIC PEPTIDE  I-STAT TROPOININ, ED    Imaging Review Dg Chest 2 View  11/01/2015  CLINICAL DATA:  Shortness of Breath for  4 days, worsening.  Cough. EXAM: CHEST  2 VIEW COMPARISON:  08/28/2015 FINDINGS: Cardiomegaly with vascular congestion. Linear subsegmental atelectasis or scarring at the right lung base. No confluent opacity on the left. No effusions. No acute bony abnormality. Mild peribronchial thickening. IMPRESSION: Cardiomegaly, vascular congestion and mild bronchitic changes. Right base atelectasis.  Electronically Signed   By: Rolm Baptise M.D.   On: 11/01/2015 13:46   I have personally reviewed and evaluated these images and lab results as part of my medical decision-making.   EKG Interpretation   Date/Time:  Wednesday November 01 2015 12:45:06 EST Ventricular Rate:  79 PR Interval:  222 QRS Duration: 100 QT Interval:  410 QTC Calculation: 470 R Axis:   8 Text Interpretation:  Sinus rhythm with sinus arrhythmia with 1st degree  A-V block Otherwise normal ECG Since last tracing rate faster Confirmed by  MILLER  MD, Roland ([id number]) on 11/01/2015 6:57:11 PM      MDM   [name] is a 75 y.o. female who presents today with a 1 day history of leg edema and shortness of breath. CXR shows cardiomegaly, vascular congestion, mild bronchitic changes, and R base atelectasis. EKG NSR with 1st degree AV block. Troponin 0.03. BMP shows elevated glucose, decreased GFR. CBC 3.3 WBC and 8.4 HCT (baseline). Dr. Sabra Heck discussed patient with Dr. Blaine Hamper who agreed to admit patient to inpatient. Admit to hospital for further evaluation and treatment.  Final diagnoses:  Acute on chronic diastolic congestive heart failure [facility])       Frederica Kuster, PA-C 11/01/15 2118  Noemi Chapel, MD 11/03/15 2136

## 2015-11-01 NOTE — ED Notes (Signed)
Pt reports to the ED for eval of BLE swelling and SOB. Pt has hx of CHF. She reports SOB with minimal exertion. She is on O2 chronically. Pt also reports lower abd pain when she has to have a BM. Pt is already on 120 mg of Lasix in the am and 100 mg in the afternoon. Denies any CP. Pt A&Ox4, resp e/u, and skin warm and dry.

## 2015-11-01 NOTE — H&P (Addendum)
[facility] History and Physical  [name] [id number] DOB: 1940/11/08 DOA: 11/01/2015  Referring physician: ED physician PCP: Ria Bush, MD  Specialists:   Chief Complaint: Cough, shortness of breath, leg swelling, suprapubic abdominal pain on urination and bowel movement  HPI: [name] is a 75 y.o. female with PMH of diabetes mellitus, hyperlipidemia, COPD, asthma, GERD, depression, anxiety, diastolic congestive heart failure, liver cirrhosis, autoimmune hepatitis, OSA not on CPAP, GAVE (gastric antral vascular ectasia), anemia, chronic kidney disease-stage III, who presents with cough, shortness of breath, leg swelling, suprapubic abdominal pain over urination and bowel movements.  Patient reports that he has been having cough, shortness breath for a few days. She coughs up greenish colored sputum, sometimes clear. She does not have fever or chills. No chest pain. She has worsening leg edema. She also reports having suprapubic abdominal pain on urination or bowel movement, but no abdominal pain at rest. No nausea, vomiting or diarrhea. Patient feels hot on urination, but denies dysuria or burning on urination. She is taking diuretics, therefore has urinary frequency. Patient does not have unilateral weakness or tenderness over calf area.  In ED, patient was found to have pancytopenia with WBC 3.3, hemoglobin 8.4, platelet 73, temperature normal, no tachycardia, has tachypnea,stable renal function, negative troponin. Pending BNP. Chest x-ray showed pulmonary vascular congestion and bronchitis change. Patient is admitted to inpatient for further evaluation and treatment.  EKG: Independently reviewed. QTC 470, no ischemic change.  Where does patient live?   At home  Can patient participate in ADLs? Barely  Review of Systems:   General: no fevers, chills, has body weight increase, has fatigue HEENT: no blurry vision, hearing changes or sore throat Pulm: has dyspnea,  coughing, wheezing CV: no chest pain, no palpitations Abd: no nausea, vomiting, has abdominal pain, no diarrhea, constipation GU: no dysuria, burning on urination, increased urinary frequency, hematuria  Ext: has leg edema Neuro: no unilateral weakness, numbness, or tingling, no vision change or hearing loss Skin: no rash MSK: No muscle spasm, no deformity, no limitation of range of movement in spin Heme: No easy bruising.  Travel history: No recent long distant travel.  Allergy:  Allergies  Allergen Reactions  . Acetaminophen Other (See Comments)    Liver dysfunction  . Nsaids Other (See Comments)    Liver dysfunction   . Aspirin Other (See Comments)    Causes nosebleeds  . Theophylline Nausea And Vomiting  . Penicillin G Itching  . Penicillins Itching    Has patient had a PCN reaction causing immediate rash, facial/tongue/throat swelling, SOB or lightheadedness with hypotension: unknown Has patient had a PCN reaction causing severe rash involving mucus membranes or skin necrosis: unknown Has patient had a PCN reaction that required hospitalization unknown Has patient had a PCN reaction occurring within the last 10 years: yes If all of the above answers are "NO", then may proceed with Cephalosporin use.     Past Medical History  Diagnosis Date  . Chronic airway obstruction, not elsewhere classified   . Unspecified chronic bronchitis ([place])   . Unspecified essential hypertension   . Non-obstructive CAD   . Palpitations   . Mild aortic stenosis     a. 03/2014 Valve area (VTI): 2.89 cm^2, Valve area (Vmax): 2.7 cm^2.  . Pure hypercholesterolemia   . Morbid obesity ([place])   . Esophageal reflux   . GAVE (gastric antral vascular ectasia)     angiodysplasia  . Diverticulosis of colon (without mention of hemorrhage)   .  Benign neoplasm of colon   . Osteoarthrosis, unspecified whether generalized or localized, unspecified site   . Osteoporosis, unspecified   . Restless legs  syndrome (RLS)   . Major depressive disorder, recurrent episode, severe, specified as with psychotic behavior   . Iron deficiency anemia secondary to blood loss (chronic)     a. frequent PRBC transfusions.  . Coarse tremors     a. arms.  . Carpal tunnel syndrome on both sides   . Chronic diastolic CHF (congestive heart failure) ([place])     a. 03/26/2014 Echo: EF 55-60%, no rwma, mild AS/AI, mod-sev Ca2+ MV annulus, mildly to mod dil LA.  [name] Asthma   . Type II diabetes mellitus ([place])   . H/O hiatal hernia   . 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 dx'd 1990's  . Adenomatous colon polyp   . Midsternal chest pain     a. conservatively managed ->poor candidate for cath/anticoagulation.  [name] Anxiety   . Portal hypertensive gastropathy   . Falls frequently     completed HHPT/OT 06/2014, PT unmet goals  . Recurrent UTI (urinary tract infection)     h/o hospitalization with urosepsis 2015, but thought large component colonization/bacteriuria, only treat if symptomatic ([place])  . Hiatal hernia   . Thrombocytopenia ([place])   . Protein calorie malnutrition ([place])   . History of pneumonia     CAP 2015  . On home oxygen therapy     "3L; 24/7" (01/25/2015)  . GAVE (gastric antral vascular ectasia)     s/p multiple APCs, discussing RFA with [facility] GI Dr Newman Pies (06/2015)  . OSA (obstructive sleep apnea)     a. does not use CPAP. (01/25/2015)  . Hepatitis     Past Surgical History  Procedure Laterality Date  . Hemorroidectomy    . Appendectomy    . Vaginal hysterectomy    . Breast biopsy      bilateral  . Cesarean section      x 3  . Appendectomy    . Esophagogastroduodenoscopy  06/12/2012    Procedure: ESOPHAGOGASTRODUODENOSCOPY (EGD);  Surgeon: Lafayette Dragon, MD;  Location: [facility];  Service: Endoscopy;  Laterality: N/A;  . Balloon dilation  06/12/2012    Procedure: BALLOON DILATION;  Surgeon: Lafayette Dragon, MD;  Location: [facility];  Service:  Endoscopy;  Laterality: N/A;  ?balloon  . Esophagogastroduodenoscopy  09/10/2012    Procedure: ESOPHAGOGASTRODUODENOSCOPY (EGD);  Surgeon: Lafayette Dragon, MD;  Location: [facility];  Service: Endoscopy;  Laterality: N/A;  . Hot hemostasis  09/10/2012    Procedure: HOT HEMOSTASIS (ARGON PLASMA COAGULATION/BICAP);  Surgeon: Lafayette Dragon, MD;  Location: [facility];  Service: Endoscopy;  Laterality: N/A;  . Esophagogastroduodenoscopy N/A 01/18/2013    Procedure: ESOPHAGOGASTRODUODENOSCOPY (EGD);  Surgeon: Lafayette Dragon, MD;  Location: [facility] ENDOSCOPY;  Service: Endoscopy;  Laterality: N/A;  . Esophagogastroduodenoscopy N/A 05/24/2013    Procedure: ESOPHAGOGASTRODUODENOSCOPY (EGD);  Surgeon: Jerene Bears, MD;  Location: [place];  Service: Gastroenterology;  Laterality: N/A;  . Hot hemostasis N/A 05/24/2013    Procedure: HOT HEMOSTASIS (ARGON PLASMA COAGULATION/BICAP);  Surgeon: Jerene Bears, MD;  Location: [place];  Service: Gastroenterology;  Laterality: N/A;  . US echocardiography  07/2014    mild LVH, EF 60-65%, normal wall motion, mild AR, mod dilated LA, mildly dilated RA, peak PA pressure 58mmHg  . Esophagogastroduodenoscopy N/A 10/20/2014    Procedure: ESOPHAGOGASTRODUODENOSCOPY (EGD);  Surgeon: Lafayette Dragon, MD;  Location: [facility];  Service: Endoscopy;  Laterality: N/A;  . Hot hemostasis N/A 10/20/2014    Procedure: HOT HEMOSTASIS (ARGON PLASMA COAGULATION/BICAP);  Surgeon: Lafayette Dragon, MD;  Location: [facility];  Service: Endoscopy;  Laterality: N/A;  . Enteroscopy N/A 02/25/2015    Procedure: ENTEROSCOPY;  Surgeon: [name] Ada, MD;  Location: [place];  Service: Endoscopy;  Laterality: N/A;  . Enteroscopy N/A 06/27/2015    Procedure: ENTEROSCOPY;  Surgeon: Manus Gunning, MD;  Location: [facility];  Service: Gastroenterology;  Laterality: N/A;  . Eus  09/2015    antral CAVL, ablated; nodular erosive gastritis; mult pancreatic cysts rec rpt CT pancreas protocol or  MRI to survey pancreas cysts 1-2 yrs (Dr Amie Critchley)    Social History:  reports that she quit smoking about 23 years ago. Her smoking use included Cigarettes. She has a 37.5 pack-year smoking history. She has never used smokeless tobacco. She reports that she does not drink alcohol or use illicit drugs.  Family History:  Family History  Problem Relation Age of Onset  . Heart disease Mother   . Cervical cancer Mother   . Kidney disease Mother   . Diabetes Mother   . Breast cancer Sister   . Multiple sclerosis Sister   . Colon cancer Neg Hx   . Breast cancer Maternal Aunt     x 2  . Diabetes Father   . Diabetes Sister   . Multiple sclerosis Other   . Heart attack Mother   . Heart attack Father   . Stroke Daughter   . Stroke Daughter      Prior to Admission medications   Medication Sig Start Date End Date Taking? Authorizing Provider  ADVAIR DISKUS 250-50 MCG/DOSE AEPB INHALE 1 PUFF INTO LUNGS TWICE A DAY 05/29/15  Yes Ria Bush, MD  albuterol (PROAIR HFA) 108 (90 BASE) MCG/ACT inhaler Inhale 2 puffs into the lungs every 6 (six) hours as needed for wheezing or shortness of breath. Reported on 08/18/2015   Yes Historical Provider, MD  ALPRAZolam Duanne Moron) 1 MG tablet Take 1 tablet (1 mg total) by mouth 2 (two) times daily as needed for anxiety or sleep. 10/17/15  Yes Ria Bush, MD  B Complex-C ([employer] B COMPLEX PLUS C) TABS TAKE ONE CAPSULE BY MOUTH EVERY DAY 04/17/15  Yes Ria Bush, MD  buPROPion (WELLBUTRIN XL) 150 MG 24 hr tablet Take 150 mg by mouth daily.   Yes Historical Provider, MD  desvenlafaxine (PRISTIQ) 50 MG 24 hr tablet Take 3 tablets (150 mg total) by mouth daily. 05/12/15  Yes Ria Bush, MD  ferrous sulfate 325 (65 FE) MG tablet Take 1 tablet (325 mg total) by mouth daily with breakfast. 06/15/15  Yes Manus Gunning, MD  furosemide (LASIX) 80 MG tablet Take one and one-half tablet by mouth twice a day Patient taking differently: Take 120 mg  by mouth 2 (two) times daily. Take one and one-half tablet by mouth twice a day 10/27/15  Yes Sherren Mocha, MD  guaiFENesin (MUCINEX) 600 MG 12 hr tablet Take 600 mg by mouth 2 (two) times daily.    Yes Historical Provider, MD  Insulin Glargine (LANTUS SOLOSTAR) 100 UNIT/ML Solostar Pen Inject 120 Units into the skin every morning. 04/13/15  Yes Renato Shin, MD  insulin lispro (HUMALOG KWIKPEN) 100 UNIT/ML KiwkPen Inject 10 Units into the skin daily with supper.    Yes Historical Provider, MD  lactulose, encephalopathy, (CHRONULAC) 10 GM/15ML SOLN Take 20 g by mouth every  evening. I have instructed pt to begin taking 30 ml of this at least once a day and if she can tolerate a second dose without too many BMs to take a 2nd dose   Yes Historical Provider, MD  levalbuterol (XOPENEX) 0.63 MG/3ML nebulizer solution Take 0.63 mg by nebulization every 8 (eight) hours as needed for wheezing or shortness of breath.  06/05/14  Yes Historical Provider, MD  loratadine (CLARITIN) 10 MG tablet TAKE 1 TABLET BY MOUTH EVERY DAY 07/14/15  Yes Ria Bush, MD  Multiple Vitamins-Minerals (CENTRUM SILVER PO) Take 1 tablet by mouth daily.    Yes Historical Provider, MD  mupirocin cream (BACTROBAN) 2 % Apply 1 application topically 2 (two) times daily.   Yes Historical Provider, MD  oxycodone (OXY-IR) 5 MG capsule Take 1 capsule (5 mg total) by mouth 2 (two) times daily as needed for pain. 10/17/15  Yes Ria Bush, MD  pantoprazole (PROTONIX) 40 MG tablet TAKE 1 TABLET BY MOUTH TWICE A DAY 10/12/15  Yes Ria Bush, MD  promethazine (PHENERGAN) 25 MG tablet Take 25 mg by mouth 2 (two) times daily as needed for nausea or vomiting.   Yes Historical Provider, MD  QUEtiapine (SEROQUEL) 200 MG tablet TAKE 1 TABLET BY MOUTH AT BEDTIME 08/15/15  Yes Ria Bush, MD  rOPINIRole (REQUIP) 4 MG tablet Take 4 mg by mouth at bedtime.   Yes Historical Provider, MD  simvastatin (ZOCOR) 40 MG tablet Take 40 mg by mouth  daily.   Yes Historical Provider, MD  Vitamin D, Ergocalciferol, (DRISDOL) 50000 units CAPS capsule TAKE ONE CAPSULE WEEKLY Patient taking differently: TAKE ONE CAPSULE WEEKLY ON FRIDAY 10/23/15  Yes Ria Bush, MD  glucose blood (ONE TOUCH ULTRA TEST) test strip Check blood sugar 4 times daily 10/16/15   Ria Bush, MD  Insulin Pen Needle (B-D ULTRAFINE III SHORT PEN) 31G X 8 MM MISC Use as directed to inject insulin twice daily. Dx: E11.8 10/13/15   Renato Shin, MD  metolazone (ZAROXOLYN) 2.5 MG tablet Take as directed for 4 lb weight gain 10/27/15   Sherren Mocha, MD  rifaximin (XIFAXAN) 550 MG TABS tablet Take 550 mg by mouth 2 (two) times daily. Reported on 10/17/2015    Historical Provider, MD    Physical Exam: Filed Vitals:   11/01/15 2030 11/01/15 2045 11/01/15 2120 11/01/15 2158  BP: 146/58 146/46  155/75  Pulse: 72 73  94  Temp:    98.9 F (37.2 C)  TempSrc:    Oral  Resp: 25 23  20   Weight:    104.4 kg (230 lb 2.6 oz)  SpO2: 98% 99% 98% 98%   General: Not in acute distress HEENT:       Eyes: PERRL, EOMI, no scleral icterus.       ENT: No discharge from the ears and nose, no pharynx injection, no tonsillar enlargement.        Neck: difficult to assess JVD due to obesity, no bruit, no mass felt. Heme: No neck lymph node enlargement. Cardiac: S1/S2, RRR, No murmurs, No gallops or rubs. Pulm: has expiratory wheezing, No rales or rubs. Abd: Soft, nondistended, nontender, no rebound pain, no organomegaly, BS present. Ext: 2+ pitting leg edema bilaterally. 2+DP/PT pulse bilaterally. Musculoskeletal: No joint deformities, No joint redness or warmth, no limitation of ROM in spin. Skin: No rashes.  Neuro: Alert, oriented X3, cranial nerves II-XII grossly intact, moves all extremities normally. Psych: Patient is not psychotic, no suicidal or hemocidal ideation.  Labs on  Admission:  Basic Metabolic Panel:  Recent Labs Lab 11/01/15 1245  NA 138  K 3.5  CL 98*  CO2  29  GLUCOSE 335*  BUN 22*  CREATININE 1.14*  CALCIUM 9.0   Liver Function Tests: No results for input(s): AST, ALT, ALKPHOS, BILITOT, PROT, ALBUMIN in the last 168 hours. No results for input(s): LIPASE, AMYLASE in the last 168 hours. No results for input(s): AMMONIA in the last 168 hours. CBC:  Recent Labs Lab 11/01/15 1207 11/01/15 1245  WBC  --  3.3*  HGB 8.1* 8.4*  HCT  --  26.6*  MCV  --  99.6  PLT  --  73*   Cardiac Enzymes: No results for input(s): CKTOTAL, CKMB, CKMBINDEX, TROPONINI in the last 168 hours.  BNP (last 3 results)  Recent Labs  02/24/15 0813 08/28/15 0524 09/01/15 0355  BNP 486.8* 956.3* 645.3*    ProBNP (last 3 results) No results for input(s): PROBNP in the last 8760 hours.  CBG:  Recent Labs Lab 11/01/15 2221  GLUCAP 120*    Radiological Exams on Admission: Dg Chest 2 View  11/01/2015  CLINICAL DATA:  Shortness of Breath for 4 days, worsening.  Cough. EXAM: CHEST  2 VIEW COMPARISON:  08/28/2015 FINDINGS: Cardiomegaly with vascular congestion. Linear subsegmental atelectasis or scarring at the right lung base. No confluent opacity on the left. No effusions. No acute bony abnormality. Mild peribronchial thickening. IMPRESSION: Cardiomegaly, vascular congestion and mild bronchitic changes. Right base atelectasis. Electronically Signed   By: Rolm Baptise M.D.   On: 11/01/2015 13:46    Assessment/Plan Principal Problem:   Acute on chronic respiratory failure (HCC) Active Problems:   HYPERCHOLESTEROLEMIA   Iron deficiency anemia due to chronic blood loss   Essential hypertension   Coronary atherosclerosis   COPD exacerbation (HCC)   GERD   Autoimmune hepatitis ([place])   Obesity, Class II, BMI 35-39.9, with comorbidity (HCC)   GAVE (gastric antral vascular ectasia)   Other pancytopenia (HCC)   CKD stage 3 secondary to diabetes (HCC)   Hepatic cirrhosis (HCC)   Acute on chronic diastolic (congestive) heart failure (HCC)   Diabetes  ([place])  Acute on chronic respiratory failure:  This is likely due to combination of CHF given 2+ leg edema and pulmonary vascular congestion on CXR, and COPD exacerbation given productive cough and wheezing on auscultation. -will admit to tele bed -treat CHF and COPD exacerbation as below  Acute on chronic diastolic congestive heart failure: 2-D echo on 08/29/15 showed EF 55-60 percent with grade 2 diastolic dysfunction. Now presents with acute on chronic exacerbation  -Lasix 120 mg bid by IV (pt received 80 mg of IV lasix in ED, will give 40 mg by IV to add up 120 mg for tonight) -trop x 3 -2d echo -Daily weights -strict I/O's -Low salt diet -No lisinopril due to worsening renal function. -No BB due to acute decompensation of CHF -No ASA due to allergy  COPD exacerbation: Patient has productive cough and wheezing on auscultation, consistent with COPD exacerbation -Nebulizers: scheduled Duoneb and prn albuterol -Solu-Medrol 60 mg IV bid -Oral azithromycin for 5 days.  -Mucinex for cough  -Urine S. pneumococcal antigen -Follow up blood culture x2, sputum culture, Flu pcr  Suprapubic abdominal pain: Patient reports suprapubic abdominal pain only on urination or having bowel movement. No tenderness on physical examination. unclear etiology. -Check urinalysis  Addendum: UA comes back positive for UTI -will start Aztreonam IV -Ux and Bx  CKD-II: stable. Baseline creatinine 1.0-1.4.  Her creatinine is 1.14, BUN 22. -Follow-up renal function by bmap  Pancytopenia: This is chronic issue. Likely due to multifactorial, including iron deficiency anemia and liver cirrhosis. No bleeding tendency currently. Hemoglobin stable. -Follow-up CBC -Continue iron supplement  HTN: -on IV Lasix  HLD: Last LDL was 52 on 12/13/14 -Continue home medications: Zocor  Hepatic cirrhosis due to autoimmune hepatitis: No signs of hepatic encephalopathy -on lactulose and  rifaximin  GERD: -Protonix  DM-II: Last A1c 6.3 on 08/28/15, well controled. Patient is taking Lantus and Humalog at home. Blood sugar 335 -Continue home dose of Lantus: 120 units daily -SSI  Depression and anxiety: Stable, no suicidal or homicidal ideations. -Continue home medications: Requip, Seroquel, when necessary Xanax  DVT ppx: SCD  Code Status: partial code (OK with CPR, but not intubation) Family Communication: Yes, patient's husband at bed side Disposition Plan: Admit to inpatient   Date of Service 11/01/2015    Ivor Costa [facility] Pager [phone]  If 7PM-7AM, please contact night-coverage [email or web address] Password [facility] 11/01/2015, 10:41 PM

## 2015-11-01 NOTE — ED Provider Notes (Signed)
The patient is a 75 year old female, no history of congestive heart failure, presents with increased peripheral edema, dyspnea, on exam she has diffuse peripheral edema, left greater than right in the lower extremities. She has a heart murmur but clear lung sounds. The patient does have a wound on the bottom of her tongue from her poor dentition but no signs of infection. She is on large if not massive amounts of Lasix every day and still gaining fluid weight. Labs reviewed and show preserved renal function compared to baseline, chronic anemia, the patient will need to be admitted to the hospitalist service. I personally spoke with the hospitalist who is agreeable to admit to a telemetry bed, inpatient status, holding orders requested.   EKG Interpretation  Date/Time:  Wednesday November 01 2015 12:45:06 EST Ventricular Rate:  79 PR Interval:  222 QRS Duration: 100 QT Interval:  410 QTC Calculation: 470 R Axis:   8 Text Interpretation:  Sinus rhythm with sinus arrhythmia with 1st degree A-V block Otherwise normal ECG Since last tracing rate faster Confirmed by Guila Owensby  MD, Fernie Grimm ([id number]) on 11/01/2015 6:57:11 PM       Medical screening examination/treatment/procedure(s) were conducted as a shared visit with non-physician practitioner(s) and myself.  I personally evaluated the patient during the encounter.  Clinical Impression:   Final diagnoses:  Acute on chronic diastolic congestive heart failure (HCC)         Noemi Chapel, MD 11/03/15 2137

## 2015-11-02 ENCOUNTER — Encounter: Payer: Self-pay | Admitting: *Deleted

## 2015-11-02 ENCOUNTER — Encounter (HOSPITAL_COMMUNITY): Payer: Self-pay | Admitting: General Practice

## 2015-11-02 ENCOUNTER — Inpatient Hospital Stay (HOSPITAL_COMMUNITY): Payer: Medicare Other

## 2015-11-02 ENCOUNTER — Other Ambulatory Visit: Payer: Self-pay | Admitting: Family Medicine

## 2015-11-02 ENCOUNTER — Ambulatory Visit: Payer: Self-pay | Admitting: *Deleted

## 2015-11-02 DIAGNOSIS — I10 Essential (primary) hypertension: Secondary | ICD-10-CM

## 2015-11-02 DIAGNOSIS — J441 Chronic obstructive pulmonary disease with (acute) exacerbation: Secondary | ICD-10-CM

## 2015-11-02 DIAGNOSIS — E1122 Type 2 diabetes mellitus with diabetic chronic kidney disease: Secondary | ICD-10-CM

## 2015-11-02 DIAGNOSIS — N183 Chronic kidney disease, stage 3 (moderate): Secondary | ICD-10-CM

## 2015-11-02 DIAGNOSIS — J9621 Acute and chronic respiratory failure with hypoxia: Secondary | ICD-10-CM

## 2015-11-02 DIAGNOSIS — I509 Heart failure, unspecified: Secondary | ICD-10-CM

## 2015-11-02 DIAGNOSIS — K7469 Other cirrhosis of liver: Secondary | ICD-10-CM

## 2015-11-02 DIAGNOSIS — I5033 Acute on chronic diastolic (congestive) heart failure: Secondary | ICD-10-CM

## 2015-11-02 LAB — BASIC METABOLIC PANEL
Anion gap: 11 (ref 5–15)
BUN: 21 mg/dL — AB (ref 6–20)
CHLORIDE: 101 mmol/L (ref 101–111)
CO2: 29 mmol/L (ref 22–32)
CREATININE: 1.16 mg/dL — AB (ref 0.44–1.00)
Calcium: 8.9 mg/dL (ref 8.9–10.3)
GFR calc non Af Amer: 46 mL/min — ABNORMAL LOW (ref >60)
GFR, EST AFRICAN AMERICAN: 53 mL/min — AB (ref >60)
Glucose, Bld: 222 mg/dL — ABNORMAL HIGH (ref 65–99)
Potassium: 3.6 mmol/L (ref 3.5–5.1)
Sodium: 141 mmol/L (ref 135–145)

## 2015-11-02 LAB — TROPONIN I
TROPONIN I: 0.03 ng/mL (ref <0.031)
Troponin I: 0.03 ng/mL (ref <0.031)

## 2015-11-02 LAB — GLUCOSE, CAPILLARY
GLUCOSE-CAPILLARY: 438 mg/dL — AB (ref 65–99)
GLUCOSE-CAPILLARY: 402 mg/dL — AB (ref 65–99)
GLUCOSE-CAPILLARY: 457 mg/dL — AB (ref 65–99)
Glucose-Capillary: 316 mg/dL — ABNORMAL HIGH (ref 65–99)
Glucose-Capillary: 377 mg/dL — ABNORMAL HIGH (ref 65–99)
Glucose-Capillary: 329 mg/dL — ABNORMAL HIGH (ref 65–99)

## 2015-11-02 LAB — STREP PNEUMONIAE URINARY ANTIGEN: Strep Pneumo Urinary Antigen: NEGATIVE

## 2015-11-02 LAB — EXPECTORATED SPUTUM ASSESSMENT W REFEX TO RESP CULTURE

## 2015-11-02 LAB — INFLUENZA PANEL BY PCR (TYPE A & B)
H1N1FLUPCR: NOT DETECTED
Influenza A By PCR: NEGATIVE
Influenza B By PCR: NEGATIVE

## 2015-11-02 LAB — ECHOCARDIOGRAM COMPLETE: Weight: 3612.02 oz

## 2015-11-02 MED ORDER — FUROSEMIDE 10 MG/ML IJ SOLN
60.0000 mg | Freq: Two times a day (BID) | INTRAMUSCULAR | Status: DC
  Administered 2015-11-03 – 2015-11-04 (×4): 60 mg via INTRAVENOUS
  Filled 2015-11-02 (×5): qty 6

## 2015-11-02 MED ORDER — METHYLPREDNISOLONE SODIUM SUCC 40 MG IJ SOLR
40.0000 mg | Freq: Two times a day (BID) | INTRAMUSCULAR | Status: DC
  Administered 2015-11-02 – 2015-11-03 (×2): 40 mg via INTRAVENOUS
  Filled 2015-11-02 (×3): qty 1

## 2015-11-02 MED ORDER — INSULIN ASPART 100 UNIT/ML ~~LOC~~ SOLN
20.0000 [IU] | Freq: Once | SUBCUTANEOUS | Status: AC
  Administered 2015-11-02: 20 [IU] via SUBCUTANEOUS

## 2015-11-02 MED ORDER — INSULIN ASPART 100 UNIT/ML ~~LOC~~ SOLN
8.0000 [IU] | Freq: Three times a day (TID) | SUBCUTANEOUS | Status: DC
  Administered 2015-11-02 – 2015-11-04 (×6): 8 [IU] via SUBCUTANEOUS
  Administered 2015-11-04: 4 [IU] via SUBCUTANEOUS
  Administered 2015-11-05 – 2015-11-06 (×5): 8 [IU] via SUBCUTANEOUS

## 2015-11-02 MED ORDER — DEXTROSE 5 % IV SOLN
1.0000 g | Freq: Three times a day (TID) | INTRAVENOUS | Status: DC
  Administered 2015-11-02 – 2015-11-05 (×10): 1 g via INTRAVENOUS
  Filled 2015-11-02 (×12): qty 1

## 2015-11-02 MED ORDER — IPRATROPIUM-ALBUTEROL 0.5-2.5 (3) MG/3ML IN SOLN
3.0000 mL | Freq: Three times a day (TID) | RESPIRATORY_TRACT | Status: DC
Start: 2015-11-02 — End: 2015-11-06
  Administered 2015-11-02 – 2015-11-06 (×13): 3 mL via RESPIRATORY_TRACT
  Filled 2015-11-02: qty 3
  Filled 2015-11-02: qty 30
  Filled 2015-11-02 (×11): qty 3

## 2015-11-02 MED ORDER — FUROSEMIDE 10 MG/ML IJ SOLN: 60.0000 mg | Freq: Two times a day (BID) | INTRAMUSCULAR | Status: DC

## 2015-11-02 MED ORDER — IPRATROPIUM-ALBUTEROL 0.5-2.5 (3) MG/3ML IN SOLN
3.0000 mL | Freq: Four times a day (QID) | RESPIRATORY_TRACT | Status: DC
  Administered 2015-11-02: 3 mL via RESPIRATORY_TRACT
  Filled 2015-11-02: qty 3

## 2015-11-02 NOTE — Progress Notes (Signed)
Nutrition Consult/Brief Note  RD consulted per COPD Gold Protocol.  Wt Readings from Last 15 Encounters:  11/02/15 225 lb 12 oz (102.4 kg)  10/27/15 229 lb 12.8 oz (104.237 kg)  10/17/15 224 lb (101.606 kg)  10/16/15 224 lb 1.9 oz (101.66 kg)  10/13/15 222 lb (100.699 kg)  10/05/15 232 lb (105.235 kg)  09/26/15 226 lb 4 oz (102.626 kg)  09/15/15 229 lb (103.874 kg)  09/14/15 225 lb (102.059 kg)  09/12/15 225 lb 12 oz (102.4 kg)  09/06/15 223 lb (101.152 kg)  09/01/15 229 lb 4.5 oz (104 kg)  08/18/15 229 lb 4 oz (103.987 kg)  08/10/15 222 lb (100.699 kg)  07/27/15 228 lb (103.42 kg)    Body mass index is 40.61 kg/(m^2). Patient meets criteria for Obesity Class III based on current BMI.   Current diet order is 2 gm Sodium, patient is consuming approximately 100% of meals at this time. Labs and medications reviewed.   No nutrition interventions warranted at this time. If nutrition issues arise, please consult RD.   [name], RD, LDN Pager #: [phone] After-Hours Pager #: [phone]

## 2015-11-02 NOTE — Progress Notes (Signed)
Utilization Review Completed.Donne Anon T3/04/2016

## 2015-11-02 NOTE — Progress Notes (Signed)
Dr. Sloan Leiter notified of pts AC CBG of 428.  Received TO to administer scheduled 8units of novolog plus 10units for SS and then recheck CBG 2hrs after dinner.

## 2015-11-02 NOTE — Progress Notes (Signed)
PT Cancellation Note  Patient Details Name: [name] MRN: [id number] DOB: [birth year]   Cancelled Treatment:    Reason Eval/Treat Not Completed: Fatigue/lethargy limiting ability to participate; attempted to see pt and she reports too fagitued, weak and SOB to participate in PT right now.  States needs [place] to come earlier in the morning.  Will attempt to see again tomorrow.   Reginia Naas 11/02/2015, 1:57 PM  Magda Kiel, Laie 11/02/2015

## 2015-11-02 NOTE — Progress Notes (Signed)
*  PRELIMINARY RESULTS* Echocardiogram 2D Echocardiogram has been performed.  Leavy Cella 11/02/2015, 10:26 AM

## 2015-11-02 NOTE — Consult Note (Signed)
   [facility] CM Inpatient Consult   11/02/2015  [name] Feb 05, 1941 [id number] Patient is currently active with [facility] for chronic disease management services.  Patient has been engaged by a [employer].  Our community based plan of care has focused on disease management and community resource support.  Patient will receive a post discharge transition of care call and will be evaluated for monthly home visits for assessments and disease process education.  Will make Inpatient Case Manager aware that [facility] following. Of note, [facility] Care Management services does not replace or interfere with any services that are needed or arranged by inpatient case management or social work.  For additional questions or referrals please contact: Natividad Brood, RN BSN [facility] Liaison  [phone] business mobile phone Toll free office [phone]

## 2015-11-02 NOTE — Evaluation (Signed)
Occupational Therapy Evaluation Patient Details Name: [name] MRN: [id number] DOB: [birth year] Today's Date: 11/02/2015    History of Present Illness Pt admitted with cough, SOB, LE edema, abdominal pain. PMH: DM, HLD, COPD, asthma, anemia, CKD, NASH, depression, anxiety. Pt is on 3 L 02 at home.   Clinical Impression   Pt was independent in self care prior to admission. She ambulates household distances with a RW and is dependent in IADL. Pt presents with generalized weakness, impaired standing balance and decreased activity tolerance.  She requires supervision for OOB mobility. She is well educated in energy conservation strategies and employs breathing techniques during exertion.  Will follow acutely. Do not anticipate pt will need post acute OT. She is currently receiving HHPT.    Follow Up Recommendations  No OT follow up    Equipment Recommendations  None recommended by OT    Recommendations for Other Services       Precautions / Restrictions Precautions Precautions: Fall      Mobility Bed Mobility Overal bed mobility: Modified Independent             General bed mobility comments: HOB up 30 degrees, uses momentum  Transfers Overall transfer level: Needs assistance Equipment used: Rolling walker (2 wheeled) Transfers: Sit to/from [facility] Sit to Stand: Supervision Stand pivot transfers: Supervision            Balance                                            ADL Overall ADL's : Needs assistance/impaired Eating/Feeding: Independent;Sitting   Grooming: Wash/dry hands;Supervision/safety;Standing   Upper Body Bathing: Sitting;Set up   Lower Body Bathing: Supervison/ safety;Sit to/from stand   Upper Body Dressing : Sitting;Set up   Lower Body Dressing: Supervision/safety;Sit to/from stand   Toilet Transfer: Supervision/safety;Stand-pivot;BSC   Toileting- Water quality scientist and Hygiene:  Supervision/safety;Sit to/from stand       Functional mobility during ADLs: Supervision/safety;Rolling walker       Vision     Perception     Praxis      Pertinent Vitals/Pain Pain Assessment: No/denies pain     Hand Dominance Right   Extremity/Trunk Assessment Upper Extremity Assessment Upper Extremity Assessment: Overall WFL for tasks assessed   Lower Extremity Assessment Lower Extremity Assessment: Defer to PT evaluation       Communication Communication Communication: No difficulties   Cognition Arousal/Alertness: Awake/alert Behavior During Therapy: WFL for tasks assessed/performed Overall Cognitive Status: Within Functional Limits for tasks assessed                     General Comments       Exercises       Shoulder Instructions      Home Living Family/patient expects to be discharged to:: Private residence Living Arrangements: Spouse/significant other Available Help at Discharge: Family;Neighbor;Available 24 hours/day Type of Home: House Home Access: Ramped entrance     Home Layout: One level     Bathroom Shower/Tub: Teacher, early years/pre: Standard     Home Equipment: Environmental consultant - 2 wheels;Walker - 4 wheels;Bedside commode;Wheelchair - manual;Hospital bed;Shower seat   Additional Comments: On 3L O2 at home      Prior Functioning/Environment Level of Independence: Needs assistance  Gait / Transfers Assistance Needed: uses RW in house, w/c when going to the dr ADL's /  Homemaking Assistance Needed: Does own sponge bathing and dressing. Assist for cooking and cleaning of daughter and neighbor.   Comments: She and her husband work together on her med box.    OT Diagnosis: Generalized weakness   OT Problem List: Decreased activity tolerance;Impaired balance (sitting and/or standing);Cardiopulmonary status limiting activity   OT Treatment/Interventions: Self-care/ADL training;Patient/family education    OT Goals(Current  goals can be found in the care plan section) Acute Rehab OT Goals Patient Stated Goal: return home OT Goal Formulation: With patient Time For Goal Achievement: 11/09/15 Potential to Achieve Goals: Good ADL Goals Pt Will Perform Grooming: with modified independence;standing (3 activities) Pt Will Transfer to Toilet: with modified independence;ambulating Pt Will Perform Toileting - Clothing Manipulation and hygiene: with modified independence;sit to/from stand  OT Frequency: Min 2X/week   Barriers to D/C:            Co-evaluation              End of Session Equipment Utilized During Treatment: Rolling walker;Oxygen Nurse Communication:  (ok to give ice )  Activity Tolerance: Patient limited by fatigue Patient left: in bed;with call bell/phone within reach;with family/visitor present   Time: [id number] OT Time Calculation (min): 20 min Charges:  OT General Charges $OT Visit: 1 Procedure OT Evaluation $OT Eval Moderate Complexity: 1 Procedure G-Codes:    Malka So 11/02/2015, 11:44 AM  [phone]

## 2015-11-02 NOTE — Progress Notes (Signed)
Patient lying in bed, no distress at this time Call light within reach 

## 2015-11-02 NOTE — Progress Notes (Signed)
Pharmacy Antibiotic Note  [name] is a 75 y.o. female admitted on 11/01/2015 with UTI.  Pharmacy has been consulted for Aztreonam dosing.  Plan: Aztreonam 1gm IV q8h Will f/u renal function, micro data, and pt's clinical condition  Weight: 230 lb 2.6 oz (104.4 kg)  Temp (24hrs), Avg:98.7 F (37.1 C), Min:98.5 F (36.9 C), Max:98.9 F (37.2 C)   Recent Labs Lab 11/01/15 1245  WBC 3.3*  CREATININE 1.14*    Estimated Creatinine Clearance: 51.1 mL/min (by C-G formula based on Cr of 1.14).    Allergies  Allergen Reactions  . Acetaminophen Other (See Comments)    Liver dysfunction  . Nsaids Other (See Comments)    Liver dysfunction   . Aspirin Other (See Comments)    Causes nosebleeds  . Theophylline Nausea And Vomiting  . Penicillin G Itching  . Penicillins Itching    Has patient had a PCN reaction causing immediate rash, facial/tongue/throat swelling, SOB or lightheadedness with hypotension: unknown Has patient had a PCN reaction causing severe rash involving mucus membranes or skin necrosis: unknown Has patient had a PCN reaction that required hospitalization unknown Has patient had a PCN reaction occurring within the last 10 years: yes If all of the above answers are "NO", then may proceed with Cephalosporin use.     Antimicrobials this admission: 3/9  Aztreonam >>  3/9 Azithromycin >> 3/13  Microbiology results: 3/8 BCx:   UCx:    Thank you for allowing pharmacy to be a part of this patient's care.  Sherlon Handing, PharmD, BCPS Clinical pharmacist, pager [phone] 11/02/2015 1:25 AM

## 2015-11-02 NOTE — Progress Notes (Signed)
PATIENT DETAILS Name: [name] Age: 75 y.o. Sex: female Date of Birth: [birth year] Admit Date: 11/01/2015 Admitting Physician Ivor Costa, MD [id number] Gutierrez, MD  Subjective: Shortness of breath is better.  Assessment/Plan: Principal Problem: Acute on chronic hypoxic respiratory failure: Secondary to COPD exacerbation and acute diastolic heart failure. Improving with nebs, diuretics. Follow.  Active Problems: Acute diastolic heart failure: Recent TTE on [name] 2017 showed EF around 55-60% with grade 2 diastolic dysfunction. Shortness of breath improving, lower extremity edema decreasing, continue diuretics but decrease Lasix to 60 mg IV twice a day. Follow.  COPD exacerbation: Improving, lungs with good air entry only with some scattered rhonchi. Decrease Solu-Medrol to 40 mg IV twice a day, continue bronchodilators. On home O2 3 L/m  UTI: Complained of dysuria on admission, continue Azactam-await cultures. Afebrile and without leukocytosis. Has chronic leukopenia.  Chronic kidney disease stage II: Close to usual baseline-follow closely.  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 Follow.  Hypertension: Controlled-only on Lasix.  Type 2 diabetes: CBGs uncontrolled-suspect secondary to steroids-continue Lantus 120 units-add 8 units of NovoLog pre-meals, continue SSI. Suspect as steroids get tapered down-CBGs will be more acceptable. 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 Follow.  Hx of GAVE and small bowel AVMs:likley contributing to anemia. No overt bleeding at present. Continue PPI  History of depression/anxiety: Stable, without any suicidal or homicidal ideations. Mildly anxious due to shortness of breath. Continue Seroquel, Pristiq, Wellbutrin  Dyslipidemia: Continue statin.  Obesity: Counseled regarding importance of weight loss  Disposition: Remain  inpatient  Antimicrobial agents  See below  Anti-infectives    Start     Dose/Rate Route Frequency Ordered Stop   11/02/15 1000  azithromycin (ZITHROMAX) tablet 250 mg     250 mg Oral Daily 11/01/15 2126 11/06/15 0959   11/02/15 0200  aztreonam (AZACTAM) 1 g in dextrose 5 % 50 mL IVPB     1 g 100 mL/hr over 30 Minutes Intravenous 3 times per day 11/02/15 0126     11/01/15 2300  rifaximin (XIFAXAN) tablet 550 mg     550 mg Oral 2 times daily 11/01/15 2119     11/01/15 2130  azithromycin (ZITHROMAX) tablet 500 mg     500 mg Oral Daily 11/01/15 2126 11/02/15 0133      DVT Prophylaxis:  SCD's  Code Status:  DNI  Family Communication None at bedside  Procedures: None  CONSULTS:  None  Time spent 30 minutes-Greater than 50% of this time was spent in counseling, explanation of diagnosis, planning of further management, and coordination of care.  MEDICATIONS: Scheduled Meds: . azithromycin  250 mg Oral Daily  . aztreonam  1 g Intravenous 3 times per day  . B-complex with vitamin C  1 tablet Oral Daily  . buPROPion  150 mg Oral Daily  . desvenlafaxine  150 mg Oral Daily  . ferrous sulfate  325 mg Oral Q breakfast  . [START ON 11/03/2015] furosemide  60 mg Intravenous Q12H  . guaiFENesin  600 mg Oral BID  . insulin aspart  0-9 Units Subcutaneous TID WC  . insulin glargine  120 Units Subcutaneous Daily  . ipratropium-albuterol  3 mL Nebulization TID  . lactulose  20 g Oral QPM  . loratadine  10 mg Oral Daily  . methylPREDNISolone (SOLU-MEDROL) injection  60 mg Intravenous Q12H  . multivitamin with minerals  1 tablet Oral q1800  . mupirocin cream  1 application Topical BID  . pantoprazole  40 mg Oral BID  . QUEtiapine  200 mg Oral QHS  . rifaximin  550 mg Oral BID  . rOPINIRole  4 mg Oral QHS  . simvastatin  40 mg Oral q1800  . sodium chloride flush  3 mL Intravenous Q12H  . Vitamin D (Ergocalciferol)  50,000 Units Oral Q7 days   Continuous Infusions:  PRN  Meds:.sodium chloride, albuterol, ALPRAZolam, oxyCODONE, sodium chloride flush, zolpidem    PHYSICAL EXAM: Vital signs in last 24 hours: Filed Vitals:   11/01/15 2120 11/01/15 2158 11/02/15 0009 11/02/15 0423  BP:  155/75  122/48  Pulse:  94  72  Temp:  98.9 F (37.2 C)  97.9 F (36.6 C)  TempSrc:  Oral  Oral  Resp:  20  20  Weight:  104.4 kg (230 lb 2.6 oz)  102.4 kg (225 lb 12 oz)  SpO2: 98% 98% 99% 97%    Weight change:  Filed Weights   11/01/15 1234 11/01/15 2158 11/02/15 0423  Weight: 104.327 kg (230 lb) 104.4 kg (230 lb 2.6 oz) 102.4 kg (225 lb 12 oz)   Body mass index is 40.61 kg/(m^2).   Gen Exam: Awake and alert with clear speech.   Neck: Supple, No JVD.   Chest: Few bibasilar rales-good air entry, some scattered rhonchi. CVS: S1 S2 Regular, no murmurs.  Abdomen: soft, BS +, non tender, non distended.  Extremities: + edema, lower extremities warm to touch. Neurologic: Non Focal.   Skin: No Rash.   Wounds: N/A.   Intake/Output from previous day:  Intake/Output Summary (Last 24 hours) at 11/02/15 1303 Last data filed at 11/02/15 1058  Gross per 24 hour  Intake    240 ml  Output   2800 ml  Net  -2560 ml     LAB RESULTS: CBC  Recent Labs Lab 11/01/15 1207 11/01/15 1245  WBC  --  3.3*  HGB 8.1* 8.4*  HCT  --  26.6*  PLT  --  73*  MCV  --  99.6  MCH  --  31.5  MCHC  --  31.6  RDW  --  16.7*    Chemistries   Recent Labs Lab 11/01/15 1245 11/02/15 0300  NA 138 141  K 3.5 3.6  CL 98* 101  CO2 29 29  GLUCOSE 335* 222*  BUN 22* 21*  CREATININE 1.14* 1.16*  CALCIUM 9.0 8.9    CBG:  Recent Labs Lab 11/01/15 2221 11/02/15 0642 11/02/15 1101  GLUCAP 120* 316* 438*    GFR Estimated Creatinine Clearance: 49.6 mL/min (by C-G formula based on Cr of 1.16).  Coagulation profile  Recent Labs Lab 11/01/15 2158  INR 1.29    Cardiac Enzymes  Recent Labs Lab 11/01/15 2158 11/02/15 0300 11/02/15 0940  TROPONINI 0.04* 0.03 0.03      Invalid input(s): POCBNP No results for input(s): DDIMER in the last 72 hours. No results for input(s): HGBA1C in the last 72 hours. No results for input(s): CHOL, HDL, LDLCALC, TRIG, CHOLHDL, LDLDIRECT in the last 72 hours. No results for input(s): TSH, T4TOTAL, T3FREE, THYROIDAB in the last 72 hours.  Invalid input(s): FREET3 No results for input(s): VITAMINB12, FOLATE, FERRITIN, TIBC, IRON, RETICCTPCT in the last 72 hours. No results for input(s): LIPASE, AMYLASE in the last 72 hours.  Urine Studies No results for input(s): UHGB, CRYS in the last 72 hours.  Invalid input(s): UACOL, UAPR, USPG,  UPH, UTP, UGL, UKET, UBIL, UNIT, UROB, ULEU, UEPI, UWBC, URBC, UBAC, CAST, UCOM, BILUA  MICROBIOLOGY: Recent Results (from the past 240 hour(s))  Culture, expectorated sputum-assessment     Status: None   Collection Time: 11/02/15 11:07 AM  Result Value Ref Range Status   Specimen Description SPUTUM  Final   Special Requests NONE  Final   Sputum evaluation THIS SPECIMEN IS ACCEPTABLE FOR SPUTUM CULTURE  Final   Report Status 11/02/2015 FINAL  Final    RADIOLOGY STUDIES/RESULTS: Dg Chest 2 View  11/01/2015  CLINICAL DATA:  Shortness of Breath for 4 days, worsening.  Cough. EXAM: CHEST  2 VIEW COMPARISON:  08/28/2015 FINDINGS: Cardiomegaly with vascular congestion. Linear subsegmental atelectasis or scarring at the right lung base. No confluent opacity on the left. No effusions. No acute bony abnormality. Mild peribronchial thickening. IMPRESSION: Cardiomegaly, vascular congestion and mild bronchitic changes. Right base atelectasis. Electronically Signed   By: Rolm Baptise M.D.   On: 11/01/2015 13:46    Oren Binet, MD  [facility] Pager:[phone]  If 7PM-7AM, please contact night-coverage [email or web address] Password [email or web address] 11/02/2015, 1:03 PM   LOS: 1 day

## 2015-11-02 NOTE — Care Management Note (Signed)
Case Management Note  Patient Details  Name: [name] MRN: [id number] Date of Birth: 09-20-1940  Subjective/Objective: CM consulted for Assistance with Xifaxan. Submitted Benefits check to CMA.                    Action/Plan:CM will continue to follow. This is a newer med for Hepatic Enceph   Expected Discharge Date:                  Expected Discharge Plan:     In-House Referral:     Discharge planning Services     Post Acute Care Choice:    Choice offered to:     DME Arranged:    DME Agency:     HH Arranged:    [place] Agency:     Status of Service:     Medicare Important Message Given:    Date Medicare IM Given:    Medicare IM give by:    Date Additional Medicare IM Given:    Additional Medicare Important Message give by:     If discussed at [place] of Stay Meetings, dates discussed:    Additional Comments:  Delrae Sawyers, RN 11/02/2015, 3:11 PM

## 2015-11-03 ENCOUNTER — Ambulatory Visit: Payer: Medicare Other | Admitting: Cardiovascular Disease

## 2015-11-03 DIAGNOSIS — Z794 Long term (current) use of insulin: Secondary | ICD-10-CM

## 2015-11-03 LAB — COMPREHENSIVE METABOLIC PANEL
ALBUMIN: 3.1 g/dL — AB (ref 3.5–5.0)
ALK PHOS: 102 U/L (ref 38–126)
ALT: 28 U/L (ref 14–54)
AST: 29 U/L (ref 15–41)
Anion gap: 10 (ref 5–15)
BUN: 32 mg/dL — ABNORMAL HIGH (ref 6–20)
CALCIUM: 9.3 mg/dL (ref 8.9–10.3)
CHLORIDE: 98 mmol/L — AB (ref 101–111)
CO2: 28 mmol/L (ref 22–32)
Creatinine, Ser: 1.22 mg/dL — ABNORMAL HIGH (ref 0.44–1.00)
GFR calc non Af Amer: 43 mL/min — ABNORMAL LOW (ref >60)
GFR, EST AFRICAN AMERICAN: 50 mL/min — AB (ref >60)
GLUCOSE: 374 mg/dL — AB (ref 65–99)
Potassium: 4.3 mmol/L (ref 3.5–5.1)
SODIUM: 136 mmol/L (ref 135–145)
Total Bilirubin: 1 mg/dL (ref 0.3–1.2)
Total Protein: 5.6 g/dL — ABNORMAL LOW (ref 6.5–8.1)

## 2015-11-03 LAB — GLUCOSE, CAPILLARY
GLUCOSE-CAPILLARY: 215 mg/dL — AB (ref 65–99)
GLUCOSE-CAPILLARY: 261 mg/dL — AB (ref 65–99)
Glucose-Capillary: 372 mg/dL — ABNORMAL HIGH (ref 65–99)
Glucose-Capillary: 223 mg/dL — ABNORMAL HIGH (ref 65–99)

## 2015-11-03 LAB — CBC
HCT: 25.9 % — ABNORMAL LOW (ref 36.0–46.0)
HEMOGLOBIN: 7.9 g/dL — AB (ref 12.0–15.0)
MCH: 30.7 pg (ref 26.0–34.0)
MCHC: 30.5 g/dL (ref 30.0–36.0)
MCV: 100.8 fL — AB (ref 78.0–100.0)
Platelets: 64 10*3/uL — ABNORMAL LOW (ref 150–400)
RBC: 2.57 MIL/uL — AB (ref 3.87–5.11)
RDW: 16.1 % — ABNORMAL HIGH (ref 11.5–15.5)
WBC: 4.3 10*3/uL (ref 4.0–10.5)

## 2015-11-03 LAB — URINE CULTURE: CULTURE: NO GROWTH

## 2015-11-03 MED ORDER — PREDNISONE 20 MG PO TABS
40.0000 mg | ORAL_TABLET | Freq: Every day | ORAL | Status: DC
  Administered 2015-11-04: 40 mg via ORAL
  Filled 2015-11-03 (×2): qty 2

## 2015-11-03 NOTE — Care Management Note (Signed)
Case Management Note  Patient Details  Name: [name] MRN: [id number] Date of Birth: [birth year]  Subjective/Objective:    COPD exacerbation                Action/Plan: NCM spoke to pt. States she is active with [place] for [facility] PT. She has RW, neb machine, and oxygen at home. NCM instructed pt to make sure family brings portable at time of dc. [facility] aware of IP stay. Pt is followed by [facility] Disease Management program. Will need resumption of care orders.    Expected Discharge Date:                  Expected Discharge Plan:  Home/Self Care  In-House Referral:  NA  Discharge planning Services  CM Consult  Post Acute Care Choice:  Home Health, Resumption of Svcs/PTA Provider Choice offered to:  Patient  DME Arranged:  N/A DME Agency:  NA  HH Arranged:  PT HH Agency:  [facility]  Status of Service:  Completed, signed off  Medicare Important Message Given:    Date Medicare IM Given:    Medicare IM give by:    Date Additional Medicare IM Given:    Additional Medicare Important Message give by:     If discussed at [place] of Stay Meetings, dates discussed:    Additional Comments:  Erenest Rasher, RN 11/03/2015, 6:19 PM

## 2015-11-03 NOTE — Progress Notes (Signed)
PATIENT DETAILS Name: [name] Age: 75 y.o. Sex: female Date of Birth: 11-May-1941 Admit Date: 11/01/2015 Admitting Physician Ivor Costa, MD [id number] Gutierrez, MD  Subjective: Shortness of breath is better-less lower extremity edema.  Assessment/Plan: Principal Problem: Acute on chronic hypoxic respiratory failure: Secondary to COPD exacerbation and acute diastolic heart failure. Improving with nebs, diuretics. Follow.  Active Problems: Acute diastolic heart failure: Recent TTE on [name] 2017 showed EF around 55-60% with grade 2 diastolic dysfunction. Shortness of breath improving, lower extremity edema has improved significantly. Continue Lasix, follow electrolytes. Weight down to 225 pounds (230 pounds on admission), -4.1 L so far.  COPD exacerbation: Improving, lungs with good air entry with no rhonchi today. Stop Solu-Medrol, transition to prednisone. Continue bronchodilators. On home O2 3 L/m  UTI: Complained of dysuria on admission, continue Azactam-await cultures. Afebrile and without leukocytosis. Has chronic leukopenia.  Chronic kidney disease stage II: Close to usual baseline-follow closely.  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 Given exertional dyspnea, may need PRBC transfusion prior to discharge. Follow for now .  Hypertension: Controlled-only on Lasix.  Type 2 diabetes: CBGs uncontrolled-suspect secondary to steroids-continue Lantus 120 units-and 8 units of NovoLog pre-meals, continue SSI. Suspect as steroids get tapered down-CBGs will be more acceptable. 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 Follow.  Hx of GAVE and small bowel AVMs:likley contributing to anemia. No overt bleeding at present. Continue PPI  History of depression/anxiety: Stable, without any suicidal or homicidal ideations. Mildly anxious due to shortness of breath. Continue  Seroquel, Pristiq, Wellbutrin  Dyslipidemia: Continue statin.  Obesity: Counseled regarding importance of weight loss  Disposition: Remain inpatient  Antimicrobial agents  See below  Anti-infectives    Start     Dose/Rate Route Frequency Ordered Stop   11/02/15 1000  azithromycin (ZITHROMAX) tablet 250 mg     250 mg Oral Daily 11/01/15 2126 11/06/15 0959   11/02/15 0200  aztreonam (AZACTAM) 1 g in dextrose 5 % 50 mL IVPB     1 g 100 mL/hr over 30 Minutes Intravenous 3 times per day 11/02/15 0126     11/01/15 2300  rifaximin (XIFAXAN) tablet 550 mg     550 mg Oral 2 times daily 11/01/15 2119     11/01/15 2130  azithromycin (ZITHROMAX) tablet 500 mg     500 mg Oral Daily 11/01/15 2126 11/02/15 0133      DVT Prophylaxis:  SCD's  Code Status:  DNI  Family Communication  family friend  at bedside  Procedures: None  CONSULTS:  None  Time spent 30 minutes-Greater than 50% of this time was spent in counseling, explanation of diagnosis, planning of further management, and coordination of care.  MEDICATIONS: Scheduled Meds: . azithromycin  250 mg Oral Daily  . aztreonam  1 g Intravenous 3 times per day  . B-complex with vitamin C  1 tablet Oral Daily  . buPROPion  150 mg Oral Daily  . desvenlafaxine  150 mg Oral Daily  . ferrous sulfate  325 mg Oral Q breakfast  . furosemide  60 mg Intravenous Q12H  . guaiFENesin  600 mg Oral BID  . insulin aspart  0-9 Units Subcutaneous TID WC  . insulin aspart  8 Units Subcutaneous TID WC  . insulin glargine  120 Units Subcutaneous Daily  . ipratropium-albuterol  3 mL Nebulization TID  . lactulose  20 g Oral QPM  . loratadine  10 mg Oral Daily  . methylPREDNISolone (SOLU-MEDROL) injection  40 mg Intravenous Q12H  . multivitamin with minerals  1 tablet Oral q1800  . mupirocin cream  1 application Topical BID  . pantoprazole  40 mg Oral BID  . QUEtiapine  200 mg Oral QHS  . rifaximin  550 mg Oral BID  . rOPINIRole  4 mg Oral  QHS  . simvastatin  40 mg Oral q1800  . sodium chloride flush  3 mL Intravenous Q12H  . Vitamin D (Ergocalciferol)  50,000 Units Oral Q7 days   Continuous Infusions:  PRN Meds:.sodium chloride, albuterol, ALPRAZolam, oxyCODONE, sodium chloride flush, zolpidem    PHYSICAL EXAM: Vital signs in last 24 hours: Filed Vitals:   11/02/15 2023 11/02/15 2109 11/03/15 0310 11/03/15 0731  BP: 157/47  145/52   Pulse: 59  59 64  Temp: 98.2 F (36.8 C)  98.2 F (36.8 C)   TempSrc: Oral  Oral   Resp: 20  21 20   Weight:   102.286 kg (225 lb 8 oz)   SpO2: 96% 96% 95% 96%    Weight change: -2.041 kg (-4 lb 8 oz) Filed Weights   11/01/15 2158 11/02/15 0423 11/03/15 0310  Weight: 104.4 kg (230 lb 2.6 oz) 102.4 kg (225 lb 12 oz) 102.286 kg (225 lb 8 oz)   Body mass index is 40.56 kg/(m^2).   Gen Exam: Awake and alert with clear speech.   Neck: Supple, No JVD.   Chest: Few bibasilar rales-good air entry CVS: S1 S2 Regular, no murmurs.  Abdomen: soft, BS +, non tender, non distended.  Extremities: trace edema, lower extremities warm to touch. Neurologic: Non Focal.   Skin: No Rash.   Wounds: N/A.   Intake/Output from previous day:  Intake/Output Summary (Last 24 hours) at 11/03/15 1118 Last data filed at 11/03/15 0312  Gross per 24 hour  Intake    720 ml  Output   2300 ml  Net  -1580 ml     LAB RESULTS: CBC  Recent Labs Lab 11/01/15 1207 11/01/15 1245 11/03/15 0300  WBC  --  3.3* 4.3  HGB 8.1* 8.4* 7.9*  HCT  --  26.6* 25.9*  PLT  --  73* 64*  MCV  --  99.6 100.8*  MCH  --  31.5 30.7  MCHC  --  31.6 30.5  RDW  --  16.7* 16.1*    Chemistries   Recent Labs Lab 11/01/15 1245 11/02/15 0300 11/03/15 0300  NA 138 141 136  K 3.5 3.6 4.3  CL 98* 101 98*  CO2 29 29 28   GLUCOSE 335* 222* 374*  BUN 22* 21* 32*  CREATININE 1.14* 1.16* 1.22*  CALCIUM 9.0 8.9 9.3    CBG:  Recent Labs Lab 11/02/15 1336 11/02/15 1642 11/02/15 2021 11/02/15 2110 11/03/15 0611    GLUCAP 402* 457* 377* 329* 372*    GFR Estimated Creatinine Clearance: 47.2 mL/min (by C-G formula based on Cr of 1.22).  Coagulation profile  Recent Labs Lab 11/01/15 2158  INR 1.29    Cardiac Enzymes  Recent Labs Lab 11/01/15 2158 11/02/15 0300 11/02/15 0940  TROPONINI 0.04* 0.03 0.03    Invalid input(s): POCBNP No results for input(s): DDIMER in the last 72 hours. No results for input(s): HGBA1C in the last 72 hours. No results for input(s): CHOL, HDL, LDLCALC, TRIG, CHOLHDL, LDLDIRECT in the last 72 hours. No results for input(s): TSH, T4TOTAL, T3FREE, THYROIDAB in the  last 72 hours.  Invalid input(s): FREET3 No results for input(s): VITAMINB12, FOLATE, FERRITIN, TIBC, IRON, RETICCTPCT in the last 72 hours. No results for input(s): LIPASE, AMYLASE in the last 72 hours.  Urine Studies No results for input(s): UHGB, CRYS in the last 72 hours.  Invalid input(s): UACOL, UAPR, USPG, UPH, UTP, UGL, UKET, UBIL, UNIT, UROB, ULEU, UEPI, UWBC, URBC, UBAC, CAST, UCOM, BILUA  MICROBIOLOGY: Recent Results (from the past 240 hour(s))  Culture, blood (Routine X 2) w Reflex to ID Panel     Status: None (Preliminary result)   Collection Time: 11/01/15 10:11 PM  Result Value Ref Range Status   Specimen Description BLOOD RIGHT ANTECUBITAL  Final   Special Requests BOTTLES DRAWN AEROBIC AND ANAEROBIC 5CC EA  Final   Culture NO GROWTH < 24 HOURS  Final   Report Status PENDING  Incomplete  Culture, blood (Routine X 2) w Reflex to ID Panel     Status: None (Preliminary result)   Collection Time: 11/01/15 10:16 PM  Result Value Ref Range Status   Specimen Description BLOOD LEFT ANTECUBITAL  Final   Special Requests IN PEDIATRIC BOTTLE 4CC  Final   Culture NO GROWTH < 24 HOURS  Final   Report Status PENDING  Incomplete  Culture, expectorated sputum-assessment     Status: None   Collection Time: 11/02/15 11:07 AM  Result Value Ref Range Status   Specimen Description SPUTUM   Final   Special Requests NONE  Final   Sputum evaluation THIS SPECIMEN IS ACCEPTABLE FOR SPUTUM CULTURE  Final   Report Status 11/02/2015 FINAL  Final  Culture, respiratory (NON-Expectorated)     Status: None (Preliminary result)   Collection Time: 11/02/15 11:07 AM  Result Value Ref Range Status   Specimen Description SPUTUM  Final   Special Requests NONE  Final   Gram Stain   Final    MODERATE WBC PRESENT, PREDOMINANTLY PMN FEW SQUAMOUS EPITHELIAL CELLS PRESENT MODERATE GRAM POSITIVE COCCI IN PAIRS IN CHAINS IN CLUSTERS THIS SPECIMEN IS ACCEPTABLE FOR SPUTUM CULTURE Performed at [facility]    Culture PENDING  Incomplete   Report Status PENDING  Incomplete    RADIOLOGY STUDIES/RESULTS: Dg Chest 2 View  11/01/2015  CLINICAL DATA:  Shortness of Breath for 4 days, worsening.  Cough. EXAM: CHEST  2 VIEW COMPARISON:  08/28/2015 FINDINGS: Cardiomegaly with vascular congestion. Linear subsegmental atelectasis or scarring at the right lung base. No confluent opacity on the left. No effusions. No acute bony abnormality. Mild peribronchial thickening. IMPRESSION: Cardiomegaly, vascular congestion and mild bronchitic changes. Right base atelectasis. Electronically Signed   By: Rolm Baptise M.D.   On: 11/01/2015 13:46    Oren Binet, MD  [facility] Pager:[phone]  If 7PM-7AM, please contact night-coverage [email or web address] Password [email or web address] 11/03/2015, 11:18 AM   LOS: 2 days

## 2015-11-03 NOTE — Clinical Documentation Improvement (Signed)
Internal Medicine  Can the diagnosis of Hyperlipidemia be further specified? Thank you  Specify the type as being:  Group A- pure hypercholesterolemia.  Group B- pure hyperglyceridemia.  Group C- mixed hyperlipidemia.  Group D- hyperchylomicronemia.  Familial combined hyperlipidemia.  Other condition  Clinically Undertermined   Supporting Information:  Continued home medication, reviewed previous labs   Please exercise your independent, professional judgment when responding. A specific answer is not anticipated or expected.   Thank You, [place] [phone]

## 2015-11-03 NOTE — Progress Notes (Signed)
Results for KYNDEL, KAUR (MRN [id number]) as of 11/03/2015 11:38  Ref. Range 11/02/2015 16:42 11/02/2015 20:21 11/02/2015 21:10 11/03/2015 06:11 11/03/2015 11:14  Glucose-Capillary Latest Ref Range: 65-99 mg/dL 457 (H) 377 (H) 329 (H) 372 (H) 215 (H)  Noted that blood sugars continue to be greater than 180 mg/dl. Noted that patient is on steroids.  Recommend increasing Novolog correction scale to MODERATE if blood sugars continue to be elevated. Will continue to monitor blood sugars while in the hospital. [name] RN BSN CDE

## 2015-11-03 NOTE — Progress Notes (Signed)
Patient sitting on side of bed, no pain, distress or needs at this time. Husband at bedside, call light within reach.

## 2015-11-03 NOTE — Progress Notes (Signed)
Pt presents with large blood clot from left nare, expelled into tissue and shown to RN.  Adding humidification to nasal cannula.

## 2015-11-03 NOTE — Evaluation (Signed)
Physical Therapy Evaluation Patient Details Name: [name] MRN: [id number] DOB: [birth year] Today's Date: 11/03/2015   History of Present Illness  Pt admitted with cough, SOB, LE edema, abdominal pain. PMH: DM, HLD, COPD, asthma, anemia, CKD, NASH, depression, anxiety. Pt is on 3 L 02 at home.  Clinical Impression  Patient presents with decreased mobility due to deficits listed in PT problem list.  She will benefit from skilled PT in the acute setting to allow return home with family support and HHPT to resume.    Follow Up Recommendations Home health PT    Equipment Recommendations  None recommended by PT    Recommendations for Other Services       Precautions / Restrictions Precautions Precautions: Fall      Mobility  Bed Mobility Overal bed mobility: Modified Independent                Transfers   Equipment used: Rolling walker (2 wheeled) Transfers: Sit to/from Stand Sit to Stand: Supervision         General transfer comment: with UE support  Ambulation/Gait Ambulation/Gait assistance: Supervision;Min guard Ambulation Distance (Feet): 125 Feet Assistive device: Rolling walker (2 wheeled) Gait Pattern/deviations: Step-through pattern;Decreased stride length     General Gait Details: maintained on 3L O2 throughout; SpO2 87% with dyspnea noted; up > 90% with cues for PLB  Stairs            Wheelchair Mobility    Modified Rankin (Stroke Patients Only)       Balance Overall balance assessment: Needs assistance         Standing balance support: Bilateral upper extremity supported Standing balance-Leahy Scale: Poor Standing balance comment: UE support needed                             Pertinent Vitals/Pain Pain Assessment: 0-10 Pain Score: 5  Pain Location: lower leg cramps Pain Descriptors / Indicators: Aching Pain Intervention(s): Monitored during session;Repositioned    Home Living Family/patient expects to be  discharged to:: Private residence Living Arrangements: Spouse/significant other Available Help at Discharge: Family;Neighbor;Available 24 hours/day (neighbor stays 4 hours in the morning) Type of Home: House Home Access: Ramped entrance;Stairs to enter (reports ramp too slick and short; folding metal ramp) Entrance Stairs-Rails: Right Entrance Stairs-Number of Steps: 3 Home Layout: One level Home Equipment: Walker - 2 wheels;Walker - 4 wheels;Bedside commode;Wheelchair - manual;Hospital bed;Shower seat      Prior Function     Gait / Transfers Assistance Needed: uses RW in house, w/c when going to the dr  ADL's / [employer] Assistance Needed: Does own sponge bathing and dressing. Assist for cooking and cleaning by daughter and neighbor.        Hand Dominance   Dominant Hand: Right    Extremity/Trunk Assessment   Upper Extremity Assessment: Overall WFL for tasks assessed           Lower Extremity Assessment: Generalized weakness      Cervical / Trunk Assessment: Kyphotic  Communication   Communication: No difficulties  Cognition Arousal/Alertness: Awake/alert Behavior During Therapy: WFL for tasks assessed/performed Overall Cognitive Status: Within Functional Limits for tasks assessed                      General Comments      Exercises Other Exercises Other Exercises: sit <> stand x 5      Assessment/Plan    PT Assessment  Patient needs continued PT services  PT Diagnosis Generalized weakness   PT Problem List Decreased strength;Decreased activity tolerance;Decreased balance;Decreased mobility;Cardiopulmonary status limiting activity  PT Treatment Interventions DME instruction;Balance training;Gait training;Functional mobility training;Stair training;Therapeutic activities;Wheelchair mobility training;Therapeutic exercise;Patient/family education   PT Goals (Current goals can be found in the Care Plan section) Acute Rehab PT Goals Patient Stated  Goal: return home PT Goal Formulation: With patient Time For Goal Achievement: 11/10/15 Potential to Achieve Goals: Good    Frequency Min 3X/week   Barriers to discharge        Co-evaluation               End of Session Equipment Utilized During Treatment: Gait belt;Oxygen Activity Tolerance: Patient limited by fatigue Patient left: with call bell/phone within reach;in bed;with family/visitor present           Time: [id number] PT Time Calculation (min) (ACUTE ONLY): 23 min   Charges:   PT Evaluation $PT Eval Moderate Complexity: 1 Procedure PT Treatments $Gait Training: 8-22 mins   PT G Codes:        Reginia Naas 25-Nov-2015, 12:52 PM  Magda Kiel, [place] 11/25/15

## 2015-11-04 DIAGNOSIS — D61818 Other pancytopenia: Secondary | ICD-10-CM

## 2015-11-04 LAB — BASIC METABOLIC PANEL
ANION GAP: 9 (ref 5–15)
BUN: 39 mg/dL — ABNORMAL HIGH (ref 6–20)
CHLORIDE: 99 mmol/L — AB (ref 101–111)
CO2: 31 mmol/L (ref 22–32)
Calcium: 9.5 mg/dL (ref 8.9–10.3)
Creatinine, Ser: 1.28 mg/dL — ABNORMAL HIGH (ref 0.44–1.00)
GFR calc Af Amer: 47 mL/min — ABNORMAL LOW (ref >60)
GFR calc non Af Amer: 41 mL/min — ABNORMAL LOW (ref >60)
GLUCOSE: 279 mg/dL — AB (ref 65–99)
POTASSIUM: 3.8 mmol/L (ref 3.5–5.1)
Sodium: 139 mmol/L (ref 135–145)

## 2015-11-04 LAB — CBC
HEMATOCRIT: 25.8 % — AB (ref 36.0–46.0)
Hemoglobin: 8 g/dL — ABNORMAL LOW (ref 12.0–15.0)
MCH: 31.5 pg (ref 26.0–34.0)
MCHC: 31 g/dL (ref 30.0–36.0)
MCV: 101.6 fL — AB (ref 78.0–100.0)
Platelets: 61 10*3/uL — ABNORMAL LOW (ref 150–400)
RBC: 2.54 MIL/uL — AB (ref 3.87–5.11)
RDW: 16.1 % — ABNORMAL HIGH (ref 11.5–15.5)
WBC: 4.3 10*3/uL (ref 4.0–10.5)

## 2015-11-04 LAB — GLUCOSE, CAPILLARY
GLUCOSE-CAPILLARY: 246 mg/dL — AB (ref 65–99)
Glucose-Capillary: 148 mg/dL — ABNORMAL HIGH (ref 65–99)
Glucose-Capillary: 171 mg/dL — ABNORMAL HIGH (ref 65–99)
Glucose-Capillary: 295 mg/dL — ABNORMAL HIGH (ref 65–99)

## 2015-11-04 MED ORDER — FUROSEMIDE 10 MG/ML IJ SOLN
40.0000 mg | Freq: Two times a day (BID) | INTRAMUSCULAR | Status: DC
  Administered 2015-11-05 (×3): 40 mg via INTRAVENOUS
  Filled 2015-11-04 (×3): qty 4

## 2015-11-04 MED ORDER — PREDNISONE 20 MG PO TABS
30.0000 mg | ORAL_TABLET | Freq: Every day | ORAL | Status: DC
  Administered 2015-11-05: 30 mg via ORAL
  Filled 2015-11-04: qty 1

## 2015-11-04 MED ORDER — POTASSIUM CHLORIDE CRYS ER 20 MEQ PO TBCR
40.0000 meq | EXTENDED_RELEASE_TABLET | Freq: Every day | ORAL | Status: DC
  Administered 2015-11-04 – 2015-11-06 (×3): 40 meq via ORAL
  Filled 2015-11-04 (×3): qty 2

## 2015-11-04 NOTE — Progress Notes (Signed)
PATIENT DETAILS Name: [name] Age: 75 y.o. Sex: female Date of Birth: [birth year] Admit Date: 11/01/2015 Admitting Physician Ivor Costa, MD [id number] Gutierrez, MD  Subjective: Slowly improving, lower extremity edema has resolved. Continues to have some amount of shortness of breath that is exclusively exertional.   Assessment/Plan: Principal Problem: Acute on chronic hypoxic respiratory failure: Secondary to COPD exacerbation and acute diastolic heart failure. Improving with nebs, diuretics. Follow.  Active Problems: Acute diastolic heart failure: Recent TTE on [name] 2017 showed EF around 55-60% with grade 2 diastolic dysfunction. Shortness of breath improving, lower extremity edema has improved significantly. Continue Lasix, follow electrolytes. Weight down to 225 pounds (230 pounds on admission), -10 L so far.  COPD exacerbation: Improving, lungs with good air entry with no rhonchi today. Continue prednisone-but taper. Continue bronchodilators. On home O2 3 L/m  UTI: Complained of dysuria on admission,  urine cultures negative, has completed 3 days of aztreonam-we will discontinue. Afebrile and without leukocytosis. Has chronic leukopenia.  Chronic kidney disease stage II: Close to usual baseline-follow closely.  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  suspect anemia playing some amount of role and exertional dyspnea. But hemoglobin around her usual baseline-doubt any benefit from transfusion at this point.   Hypertension: Controlled-only on Lasix.  Type 2 diabetes: CBGs much better-as steroids have now been tapered down.Continue Lantus 120 units-and 8 units of NovoLog pre-meals, continue SSI.   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 Follow.  Hx of GAVE and small bowel AVMs:likley contributing to anemia. No overt bleeding at present. Continue PPI  History of  depression/anxiety: Stable, without any suicidal or homicidal ideations. Mildly anxious due to shortness of breath. Continue Seroquel, Pristiq, Wellbutrin  Dyslipidemia: Continue statin.  Obesity: Counseled regarding importance of weight loss  Palliative care: 75 year old unfortunate female with history of cirrhosis, COPD, chronic diastolic heart failure pancytopenia, chronic GI bleeding from GAVE/AVM's-multiple hospitalizations recently for similar issues-continues to have shortness of breath which I believe is mostly multifactorial from COPD/CHF and anemia. Moreover, she is also failure to thrive slowly-and has very poor overall prognoses. She is aware. Plan is to discharge home when she is less symptomatic.   Disposition: Remain inpatient  Antimicrobial agents  See below  Anti-infectives    Start     Dose/Rate Route Frequency Ordered Stop   11/02/15 1000  azithromycin (ZITHROMAX) tablet 250 mg     250 mg Oral Daily 11/01/15 2126 11/06/15 0959   11/02/15 0200  aztreonam (AZACTAM) 1 g in dextrose 5 % 50 mL IVPB     1 g 100 mL/hr over 30 Minutes Intravenous 3 times per day 11/02/15 0126     11/01/15 2300  rifaximin (XIFAXAN) tablet 550 mg     550 mg Oral 2 times daily 11/01/15 2119     11/01/15 2130  azithromycin (ZITHROMAX) tablet 500 mg     500 mg Oral Daily 11/01/15 2126 11/02/15 0133      DVT Prophylaxis:  SCD's  Code Status:  DNI  Family Communication  family friend  at bedside  Procedures: None  CONSULTS:  None  Time spent 30 minutes-Greater than 50% of this time was spent in counseling, explanation of diagnosis, planning of further management, and coordination of care.  MEDICATIONS: Scheduled Meds: . azithromycin  250 mg Oral Daily  . aztreonam  1 g Intravenous 3 times per day  .  B-complex with vitamin C  1 tablet Oral Daily  . buPROPion  150 mg Oral Daily  . desvenlafaxine  150 mg Oral Daily  . ferrous sulfate  325 mg Oral Q breakfast  . furosemide  60  mg Intravenous Q12H  . guaiFENesin  600 mg Oral BID  . insulin aspart  0-9 Units Subcutaneous TID WC  . insulin aspart  8 Units Subcutaneous TID WC  . insulin glargine  120 Units Subcutaneous Daily  . ipratropium-albuterol  3 mL Nebulization TID  . lactulose  20 g Oral QPM  . loratadine  10 mg Oral Daily  . multivitamin with minerals  1 tablet Oral q1800  . mupirocin cream  1 application Topical BID  . pantoprazole  40 mg Oral BID  . predniSONE  40 mg Oral Q breakfast  . QUEtiapine  200 mg Oral QHS  . rifaximin  550 mg Oral BID  . rOPINIRole  4 mg Oral QHS  . simvastatin  40 mg Oral q1800  . sodium chloride flush  3 mL Intravenous Q12H  . Vitamin D (Ergocalciferol)  50,000 Units Oral Q7 days   Continuous Infusions:  PRN Meds:.sodium chloride, albuterol, ALPRAZolam, oxyCODONE, sodium chloride flush, zolpidem    PHYSICAL EXAM: Vital signs in last 24 hours: Filed Vitals:   11/03/15 1924 11/03/15 2147 11/04/15 0551 11/04/15 0928  BP: 148/39  178/68   Pulse: 65  57   Temp: 98.6 F (37 C)  98.1 F (36.7 C)   TempSrc: Oral  Oral   Resp: 18  18   Weight:   102.468 kg (225 lb 14.4 oz)   SpO2: 93% 95% 98% 98%    Weight change: 0.181 kg (6.4 oz) Filed Weights   11/02/15 0423 11/03/15 0310 11/04/15 0551  Weight: 102.4 kg (225 lb 12 oz) 102.286 kg (225 lb 8 oz) 102.468 kg (225 lb 14.4 oz)   Body mass index is 40.63 kg/(m^2).   Gen Exam: Awake and alert with clear speech.   Neck: Supple, No JVD.   Chest:bilaterally clear S: S1 S2 Regular, no murmurs.  Abdomen: soft, BS +, non tender, non distended.  Extremities: No edema, lower extremities warm to touch. Neurologic: Non Focal.   Skin: No Rash.   Wounds: N/A.   Intake/Output from previous day:  Intake/Output Summary (Last 24 hours) at 11/04/15 1353 Last data filed at 11/04/15 0854  Gross per 24 hour  Intake    240 ml  Output   5701 ml  Net  -5461 ml     LAB RESULTS: CBC  Recent Labs Lab 11/01/15 1207  11/01/15 1245 11/03/15 0300 11/04/15 0302  WBC  --  3.3* 4.3 4.3  HGB 8.1* 8.4* 7.9* 8.0*  HCT  --  26.6* 25.9* 25.8*  PLT  --  73* 64* 61*  MCV  --  99.6 100.8* 101.6*  MCH  --  31.5 30.7 31.5  MCHC  --  31.6 30.5 31.0  RDW  --  16.7* 16.1* 16.1*    Chemistries   Recent Labs Lab 11/01/15 1245 11/02/15 0300 11/03/15 0300 11/04/15 0302  NA 138 141 136 139  K 3.5 3.6 4.3 3.8  CL 98* 101 98* 99*  CO2 29 29 28 31   GLUCOSE 335* 222* 374* 279*  BUN 22* 21* 32* 39*  CREATININE 1.14* 1.16* 1.22* 1.28*  CALCIUM 9.0 8.9 9.3 9.5    CBG:  Recent Labs Lab 11/03/15 1114 11/03/15 1601 11/03/15 2033 11/04/15 0706 11/04/15 1136  GLUCAP 215*  261* 223* 148* 171*    GFR Estimated Creatinine Clearance: 45 mL/min (by C-G formula based on Cr of 1.28).  Coagulation profile  Recent Labs Lab 11/01/15 2158  INR 1.29    Cardiac Enzymes  Recent Labs Lab 11/01/15 2158 11/02/15 0300 11/02/15 0940  TROPONINI 0.04* 0.03 0.03    Invalid input(s): POCBNP No results for input(s): DDIMER in the last 72 hours. No results for input(s): HGBA1C in the last 72 hours. No results for input(s): CHOL, HDL, LDLCALC, TRIG, CHOLHDL, LDLDIRECT in the last 72 hours. No results for input(s): TSH, T4TOTAL, T3FREE, THYROIDAB in the last 72 hours.  Invalid input(s): FREET3 No results for input(s): VITAMINB12, FOLATE, FERRITIN, TIBC, IRON, RETICCTPCT in the last 72 hours. No results for input(s): LIPASE, AMYLASE in the last 72 hours.  Urine Studies No results for input(s): UHGB, CRYS in the last 72 hours.  Invalid input(s): UACOL, UAPR, USPG, UPH, UTP, UGL, UKET, UBIL, UNIT, UROB, ULEU, UEPI, UWBC, URBC, UBAC, CAST, UCOM, BILUA  MICROBIOLOGY: Recent Results (from the past 240 hour(s))  Culture, blood (Routine X 2) w Reflex to ID Panel     Status: None (Preliminary result)   Collection Time: 11/01/15 10:11 PM  Result Value Ref Range Status   Specimen Description BLOOD RIGHT ANTECUBITAL   Final   Special Requests BOTTLES DRAWN AEROBIC AND ANAEROBIC 5CC EA  Final   Culture NO GROWTH 2 DAYS  Final   Report Status PENDING  Incomplete  Culture, blood (Routine X 2) w Reflex to ID Panel     Status: None (Preliminary result)   Collection Time: 11/01/15 10:16 PM  Result Value Ref Range Status   Specimen Description BLOOD LEFT ANTECUBITAL  Final   Special Requests IN PEDIATRIC BOTTLE 4CC  Final   Culture NO GROWTH 2 DAYS  Final   Report Status PENDING  Incomplete  Culture, expectorated sputum-assessment     Status: None   Collection Time: 11/02/15 11:07 AM  Result Value Ref Range Status   Specimen Description SPUTUM  Final   Special Requests NONE  Final   Sputum evaluation THIS SPECIMEN IS ACCEPTABLE FOR SPUTUM CULTURE  Final   Report Status 11/02/2015 FINAL  Final  Urine culture     Status: None   Collection Time: 11/02/15 11:07 AM  Result Value Ref Range Status   Specimen Description URINE, CATHETERIZED  Final   Special Requests NONE  Final   Culture NO GROWTH 1 DAY  Final   Report Status 11/03/2015 FINAL  Final  Culture, respiratory (NON-Expectorated)     Status: None (Preliminary result)   Collection Time: 11/02/15 11:07 AM  Result Value Ref Range Status   Specimen Description SPUTUM  Final   Special Requests NONE  Final   Gram Stain   Final    MODERATE WBC PRESENT, PREDOMINANTLY PMN FEW SQUAMOUS EPITHELIAL CELLS PRESENT MODERATE GRAM POSITIVE COCCI IN PAIRS IN CHAINS IN CLUSTERS THIS SPECIMEN IS ACCEPTABLE FOR SPUTUM CULTURE Performed at [facility]    Culture   Final    Culture reincubated for better growth Performed at [facility]    Report Status PENDING  Incomplete    RADIOLOGY STUDIES/RESULTS: Dg Chest 2 View  11/01/2015  CLINICAL DATA:  Shortness of Breath for 4 days, worsening.  Cough. EXAM: CHEST  2 VIEW COMPARISON:  08/28/2015 FINDINGS: Cardiomegaly with vascular congestion. Linear subsegmental atelectasis or scarring at the right  lung base. No confluent opacity on the left. No effusions. No acute bony abnormality.  Mild peribronchial thickening. IMPRESSION: Cardiomegaly, vascular congestion and mild bronchitic changes. Right base atelectasis. Electronically Signed   By: Rolm Baptise M.D.   On: 11/01/2015 13:46    Oren Binet, MD  [facility] Pager:[phone]  If 7PM-7AM, please contact night-coverage [email or web address] Password [email or web address] 11/04/2015, 1:53 PM   LOS: 3 days

## 2015-11-05 DIAGNOSIS — Z515 Encounter for palliative care: Secondary | ICD-10-CM

## 2015-11-05 DIAGNOSIS — K219 Gastro-esophageal reflux disease without esophagitis: Secondary | ICD-10-CM

## 2015-11-05 LAB — CULTURE, RESPIRATORY W GRAM STAIN: Culture: NORMAL

## 2015-11-05 LAB — BASIC METABOLIC PANEL
Anion gap: 11 (ref 5–15)
BUN: 38 mg/dL — ABNORMAL HIGH (ref 6–20)
CALCIUM: 9 mg/dL (ref 8.9–10.3)
CHLORIDE: 98 mmol/L — AB (ref 101–111)
CO2: 30 mmol/L (ref 22–32)
CREATININE: 1.2 mg/dL — AB (ref 0.44–1.00)
GFR calc non Af Amer: 44 mL/min — ABNORMAL LOW (ref >60)
GFR, EST AFRICAN AMERICAN: 51 mL/min — AB (ref >60)
GLUCOSE: 331 mg/dL — AB (ref 65–99)
Potassium: 3.7 mmol/L (ref 3.5–5.1)
Sodium: 139 mmol/L (ref 135–145)

## 2015-11-05 LAB — GLUCOSE, CAPILLARY
GLUCOSE-CAPILLARY: 298 mg/dL — AB (ref 65–99)
GLUCOSE-CAPILLARY: 324 mg/dL — AB (ref 65–99)
Glucose-Capillary: 293 mg/dL — ABNORMAL HIGH (ref 65–99)
Glucose-Capillary: 192 mg/dL — ABNORMAL HIGH (ref 65–99)

## 2015-11-05 LAB — CBC
HEMATOCRIT: 25.3 % — AB (ref 36.0–46.0)
Hemoglobin: 7.7 g/dL — ABNORMAL LOW (ref 12.0–15.0)
MCH: 30.7 pg (ref 26.0–34.0)
MCHC: 30.4 g/dL (ref 30.0–36.0)
MCV: 100.8 fL — AB (ref 78.0–100.0)
Platelets: 59 10*3/uL — ABNORMAL LOW (ref 150–400)
RBC: 2.51 MIL/uL — ABNORMAL LOW (ref 3.87–5.11)
RDW: 15.8 % — AB (ref 11.5–15.5)
WBC: 3.3 10*3/uL — AB (ref 4.0–10.5)

## 2015-11-05 LAB — CULTURE, RESPIRATORY

## 2015-11-05 MED ORDER — OXYCODONE HCL 5 MG PO TABS
5.0000 mg | ORAL_TABLET | Freq: Four times a day (QID) | ORAL | Status: DC | PRN
  Administered 2015-11-05: 5 mg via ORAL
  Filled 2015-11-05: qty 1

## 2015-11-05 MED ORDER — FERROUS SULFATE 325 (65 FE) MG PO TABS
325.0000 mg | ORAL_TABLET | Freq: Two times a day (BID) | ORAL | Status: DC
  Administered 2015-11-05 – 2015-11-06 (×2): 325 mg via ORAL
  Filled 2015-11-05 (×2): qty 1

## 2015-11-05 MED ORDER — PREDNISONE 20 MG PO TABS
20.0000 mg | ORAL_TABLET | Freq: Every day | ORAL | Status: DC
  Administered 2015-11-06: 20 mg via ORAL
  Filled 2015-11-05: qty 1

## 2015-11-05 MED ORDER — PREGABALIN 25 MG PO CAPS
50.0000 mg | ORAL_CAPSULE | Freq: Two times a day (BID) | ORAL | Status: DC
  Filled 2015-11-05 (×2): qty 2

## 2015-11-05 NOTE — Progress Notes (Signed)
PATIENT DETAILS Name: [name] Age: 75 y.o. Sex: female Date of Birth: 20-Apr-1941 Admit Date: 11/01/2015 Admitting Physician Ivor Costa, MD [id number] Gutierrez, MD  Subjective: Slowly improving, lower extremity edema has resolved. Complains of nausea this morning. Still claims to have some amount of exertional dyspnea although better.  Assessment/Plan: Principal Problem: Acute on chronic hypoxic respiratory failure: Secondary to COPD exacerbation and acute diastolic heart failure. Improving with nebs, diuretics. Follow.  Active Problems: Acute diastolic heart failure: Recent TTE on June 2017 showed EF around 55-60% with grade 2 diastolic dysfunction. Shortness of breath improving, lower extremity edema has resolved. Continue Lasix, follow electrolytes. Weight down to 222 pounds (230 pounds on admission), -12 L so far.  COPD exacerbation: Improving, lungs with good air entry with no rhonchi today. Continue prednisone-but taper. Continue bronchodilators. On home O2 3 L/m  UTI: Complained of dysuria on admission,  urine cultures negative, has completed 3 days of aztreonam-we will discontinue. Afebrile and without leukocytosis. Has chronic leukopenia.  Chronic kidney disease stage II: Close to usual baseline-follow closely.  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 Suspect anemia playing some amount of role and exertional dyspnea. Has required transfusion in the past, significant drop in hemoglobin and will transfuse.  Hypertension: Controlled-only on Lasix.  Type 2 diabetes: CBGs much better-as steroids have now been tapered down.Continue Lantus 120 units-and 8 units of NovoLog pre-meals, continue SSI.   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 Follow.  Hx of GAVE and small bowel AVMs:likley contributing to anemia. No overt bleeding at present. Continue PPI  Peripheral  neuropathy: Complains of pain in the lower extremities-suspect peripheral neuropathy secondary to diabetes. Start lyrica-follow  History of depression/anxiety: Stable, without any suicidal or homicidal ideations. Mildly anxious due to shortness of breath. Continue Seroquel, Pristiq, Wellbutrin  Dyslipidemia: Continue statin.  Obesity: Counseled regarding importance of weight loss  Palliative care: 75 year old unfortunate female with history of cirrhosis, COPD, chronic diastolic heart failure pancytopenia, chronic GI bleeding from GAVE/AVM's-multiple hospitalizations recently for similar issues-continues to have shortness of breath which I believe is mostly multifactorial from COPD/CHF and anemia. Moreover, she is also failure to thrive slowly-and has poor overall prognoses. She continues to have a multitude of problems including exertional shortness of breath, suspected neuropathic pain in her lower extremities, nausea, poor appetite and generalized weakness. I suspect that with a multitude of chronic medical issues, that these issues will continue. I will consult palliative care for goals of care and symptom management.  Disposition: Remain inpatient-home in the next few days.  Antimicrobial agents  See below  Anti-infectives    Start     Dose/Rate Route Frequency Ordered Stop   11/02/15 1000  azithromycin (ZITHROMAX) tablet 250 mg     250 mg Oral Daily 11/01/15 2126 11/05/15 1002   11/02/15 0200  aztreonam (AZACTAM) 1 g in dextrose 5 % 50 mL IVPB     1 g 100 mL/hr over 30 Minutes Intravenous 3 times per day 11/02/15 0126     11/01/15 2300  rifaximin (XIFAXAN) tablet 550 mg     550 mg Oral 2 times daily 11/01/15 2119     11/01/15 2130  azithromycin (ZITHROMAX) tablet 500 mg     500 mg Oral Daily 11/01/15 2126 11/02/15 0133      DVT Prophylaxis:  SCD's  Code Status:  DNI  Family Communication None at  bedside  Procedures: None  CONSULTS:  None  Time spent 25  minutes-Greater than 50% of this time was spent in counseling, explanation of diagnosis, planning of further management, and coordination of care.  MEDICATIONS: Scheduled Meds: . aztreonam  1 g Intravenous 3 times per day  . B-complex with vitamin C  1 tablet Oral Daily  . buPROPion  150 mg Oral Daily  . desvenlafaxine  150 mg Oral Daily  . ferrous sulfate  325 mg Oral Q breakfast  . furosemide  40 mg Intravenous Q12H  . guaiFENesin  600 mg Oral BID  . insulin aspart  0-9 Units Subcutaneous TID WC  . insulin aspart  8 Units Subcutaneous TID WC  . insulin glargine  120 Units Subcutaneous Daily  . ipratropium-albuterol  3 mL Nebulization TID  . lactulose  20 g Oral QPM  . loratadine  10 mg Oral Daily  . multivitamin with minerals  1 tablet Oral q1800  . mupirocin cream  1 application Topical BID  . pantoprazole  40 mg Oral BID  . potassium chloride  40 mEq Oral Daily  . predniSONE  30 mg Oral Q breakfast  . pregabalin  50 mg Oral BID  . QUEtiapine  200 mg Oral QHS  . rifaximin  550 mg Oral BID  . rOPINIRole  4 mg Oral QHS  . simvastatin  40 mg Oral q1800  . sodium chloride flush  3 mL Intravenous Q12H  . Vitamin D (Ergocalciferol)  50,000 Units Oral Q7 days   Continuous Infusions:  PRN Meds:.sodium chloride, albuterol, ALPRAZolam, oxyCODONE, sodium chloride flush, zolpidem    PHYSICAL EXAM: Vital signs in last 24 hours: Filed Vitals:   11/04/15 2152 11/05/15 0438 11/05/15 0457 11/05/15 0924  BP:   142/44   Pulse:   55 61  Temp:   97.9 F (36.6 C)   TempSrc:   Oral   Resp:   18 20  Weight:  101.107 kg (222 lb 14.4 oz)    SpO2: 95%  97% 98%    Weight change: -1.361 kg (-3 lb) Filed Weights   11/03/15 0310 11/04/15 0551 11/05/15 0438  Weight: 102.286 kg (225 lb 8 oz) 102.468 kg (225 lb 14.4 oz) 101.107 kg (222 lb 14.4 oz)   Body mass index is 40.09 kg/(m^2).   Gen Exam: Awake and alert with clear speech.   Neck: Supple, No JVD.   Chest:bilaterally clear S: S1 S2  Regular, no murmurs.  Abdomen: soft, BS +, non tender, non distended.  Extremities: No edema, lower extremities warm to touch. Neurologic: Non Focal.   Skin: No Rash.   Wounds: N/A.   Intake/Output from previous day:  Intake/Output Summary (Last 24 hours) at 11/05/15 1131 Last data filed at 11/05/15 0844  Gross per 24 hour  Intake    240 ml  Output   3050 ml  Net  -2810 ml     LAB RESULTS: CBC  Recent Labs Lab 11/01/15 1207 11/01/15 1245 11/03/15 0300 11/04/15 0302 11/05/15 0553  WBC  --  3.3* 4.3 4.3 3.3*  HGB 8.1* 8.4* 7.9* 8.0* 7.7*  HCT  --  26.6* 25.9* 25.8* 25.3*  PLT  --  73* 64* 61* 59*  MCV  --  99.6 100.8* 101.6* 100.8*  MCH  --  31.5 30.7 31.5 30.7  MCHC  --  31.6 30.5 31.0 30.4  RDW  --  16.7* 16.1* 16.1* 15.8*    Chemistries   Recent Labs Lab 11/01/15 1245 11/02/15 0300  11/03/15 0300 11/04/15 0302 11/05/15 0553  NA 138 141 136 139 139  K 3.5 3.6 4.3 3.8 3.7  CL 98* 101 98* 99* 98*  CO2 29 29 28 31 30   GLUCOSE 335* 222* 374* 279* 331*  BUN 22* 21* 32* 39* 38*  CREATININE 1.14* 1.16* 1.22* 1.28* 1.20*  CALCIUM 9.0 8.9 9.3 9.5 9.0    CBG:  Recent Labs Lab 11/04/15 0706 11/04/15 1136 11/04/15 1610 11/04/15 2138 11/05/15 0656  GLUCAP 148* 171* 246* 295* 293*    GFR Estimated Creatinine Clearance: 47.6 mL/min (by C-G formula based on Cr of 1.2).  Coagulation profile  Recent Labs Lab 11/01/15 2158  INR 1.29    Cardiac Enzymes  Recent Labs Lab 11/01/15 2158 11/02/15 0300 11/02/15 0940  TROPONINI 0.04* 0.03 0.03    Invalid input(s): POCBNP No results for input(s): DDIMER in the last 72 hours. No results for input(s): HGBA1C in the last 72 hours. No results for input(s): CHOL, HDL, LDLCALC, TRIG, CHOLHDL, LDLDIRECT in the last 72 hours. No results for input(s): TSH, T4TOTAL, T3FREE, THYROIDAB in the last 72 hours.  Invalid input(s): FREET3 No results for input(s): VITAMINB12, FOLATE, FERRITIN, TIBC, IRON, RETICCTPCT  in the last 72 hours. No results for input(s): LIPASE, AMYLASE in the last 72 hours.  Urine Studies No results for input(s): UHGB, CRYS in the last 72 hours.  Invalid input(s): UACOL, UAPR, USPG, UPH, UTP, UGL, UKET, UBIL, UNIT, UROB, ULEU, UEPI, UWBC, URBC, UBAC, CAST, UCOM, BILUA  MICROBIOLOGY: Recent Results (from the past 240 hour(s))  Culture, blood (Routine X 2) w Reflex to ID Panel     Status: None (Preliminary result)   Collection Time: 11/01/15 10:11 PM  Result Value Ref Range Status   Specimen Description BLOOD RIGHT ANTECUBITAL  Final   Special Requests BOTTLES DRAWN AEROBIC AND ANAEROBIC 5CC EA  Final   Culture NO GROWTH 4 DAYS  Final   Report Status PENDING  Incomplete  Culture, blood (Routine X 2) w Reflex to ID Panel     Status: None (Preliminary result)   Collection Time: 11/01/15 10:16 PM  Result Value Ref Range Status   Specimen Description BLOOD LEFT ANTECUBITAL  Final   Special Requests IN PEDIATRIC BOTTLE 4CC  Final   Culture NO GROWTH 4 DAYS  Final   Report Status PENDING  Incomplete  Culture, expectorated sputum-assessment     Status: None   Collection Time: 11/02/15 11:07 AM  Result Value Ref Range Status   Specimen Description SPUTUM  Final   Special Requests NONE  Final   Sputum evaluation THIS SPECIMEN IS ACCEPTABLE FOR SPUTUM CULTURE  Final   Report Status 11/02/2015 FINAL  Final  Urine culture     Status: None   Collection Time: 11/02/15 11:07 AM  Result Value Ref Range Status   Specimen Description URINE, CATHETERIZED  Final   Special Requests NONE  Final   Culture NO GROWTH 1 DAY  Final   Report Status 11/03/2015 FINAL  Final  Culture, respiratory (NON-Expectorated)     Status: None (Preliminary result)   Collection Time: 11/02/15 11:07 AM  Result Value Ref Range Status   Specimen Description SPUTUM  Final   Special Requests NONE  Final   Gram Stain   Final    MODERATE WBC PRESENT, PREDOMINANTLY PMN FEW SQUAMOUS EPITHELIAL CELLS  PRESENT MODERATE GRAM POSITIVE COCCI IN PAIRS IN CHAINS IN CLUSTERS THIS SPECIMEN IS ACCEPTABLE FOR SPUTUM CULTURE Performed at [facility]  Final    Culture reincubated for better growth Performed at [facility]    Report Status PENDING  Incomplete    RADIOLOGY STUDIES/RESULTS: Dg Chest 2 View  11/01/2015  CLINICAL DATA:  Shortness of Breath for 4 days, worsening.  Cough. EXAM: CHEST  2 VIEW COMPARISON:  08/28/2015 FINDINGS: Cardiomegaly with vascular congestion. Linear subsegmental atelectasis or scarring at the right lung base. No confluent opacity on the left. No effusions. No acute bony abnormality. Mild peribronchial thickening. IMPRESSION: Cardiomegaly, vascular congestion and mild bronchitic changes. Right base atelectasis. Electronically Signed   By: Rolm Baptise M.D.   On: 11/01/2015 13:46    Oren Binet, MD  [facility] Pager:[phone]  If 7PM-7AM, please contact night-coverage [email or web address] Password [facility] 11/05/2015, 11:31 AM   LOS: 4 days

## 2015-11-05 NOTE — Consult Note (Signed)
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 Chronically struggles with worsening SOB likely r/t COPD complicated by CHF and chronic anemia.   Clinical Assessment/Narrative: I met today with Ms. [name] along with her sister at bedside. Ms. [name] tells me that she has had a frank conversation with Dr. Sloan [name] that "there is nothing else to offer" to reverse her condition. She has good understanding and says that she can tell that her health has been declining even though she has not previously had any direct conversations regarding this subject. Answered many questions (especially of her sister's) regarding the complexity of her comorbitities causing decline and conditions that are not reversible.   Ms. [name] wishes to continue basic treatment and would return to the hospital to receive IV Lasix, blood transfusion, and to treat the treatable. She is aware that there are limited options and even these therapies will not be enough one day. We discussed hospice option and she is not quite ready for this option or full comfort care. She would not want dialysis if needed. We also discussed code status - she confirms that she would NOT want intubation but would want CPR/shock - she says that this would only be so her son from [place] could get to her as they have not seen him in a couple years. I explained that this would not be effective with her COPD and main symptom being SOB that CPR/shock is unlikely to help her live (and if so would not be in  a state to interact with her son). Her sister suggests, and I agree, that he should plan on coming now while she is well enough to enjoy a visit. She will plan to speak with her son. No change in code status today.   Her main goals are to be as functional as possible (be able to walk and manage SOB). She would like to also spend more time with her family and plan to talk with them about this. She would like to be able to visit her daughter who resides at [facility] (h/o Lupus and strokes) that is ~ 10 miles away from her home. She is also interested in palliative outpatient program with [facility] - will refer tomorrow.   Contacts/Participants in Discussion: Primary Decision Maker: Self     Code Status/Advance Care Planning: Limited code    Code Status Orders        Start     Ordered   11/01/15 2157  Limited resuscitation (code)   Continuous    Question Answer Comment  In the event of cardiac or respiratory ARREST: Initiate Code Blue, Call Rapid Response Yes   In the event of cardiac or respiratory ARREST: Perform CPR Yes   In the event of cardiac or respiratory ARREST: Perform Intubation/Mechanical Ventilation No   In the event of cardiac or respiratory ARREST: Use NIPPV/BiPAp only if indicated Yes   In the event of cardiac or respiratory ARREST: Administer ACLS medications if indicated Yes   In the event of cardiac or respiratory ARREST: Perform Defibrillation or Cardioversion if indicated Yes      11/01/15 2157    Code Status History  Date Active Date Inactive Code Status Order ID Comments User Context   11/01/2015  9:23 PM 11/01/2015  9:56 PM Full Code [id number]  Ivor Costa, MD ED   08/28/2015  2:15 PM 09/01/2015  5:17 PM DNR [id number]  Radene Gunning, NP Inpatient   02/23/2015  8:50 PM 02/28/2015  7:41 PM Partial Code [id number]  Ivor Costa, MD ED   02/16/2015  6:06 PM 02/23/2015  8:50 PM DNR [id number]  Hennie Duos, MD Outpatient   01/25/2015  5:31 PM 01/28/2015  9:01 PM Full Code  [id number]  Melton Alar, PA-C Inpatient   08/11/2014 11:31 PM 08/16/2014  3:16 PM Full Code [id number]  Rise Patience, MD Inpatient   03/26/2014  1:21 PM 03/31/2014  7:49 PM Partial Code [id number]  Bonnielee Haff, MD Inpatient   03/26/2014  1:46 AM 03/26/2014  1:21 PM Full Code [id number]  Hosie Poisson, MD ED   10/03/2013  4:35 AM 10/04/2013  4:21 PM DNR [id number]  Bynum Bellows, MD Inpatient   05/23/2013 12:23 AM 05/26/2013 10:07 PM Full Code [id number]  Theressa Millard, MD ED   02/16/2013  9:16 PM 02/19/2013  9:04 PM Full Code [id number]  Etta Quill, DO ED   02/01/2013 11:37 PM 02/04/2013  2:03 PM Full Code [id number]  Rise Patience, MD Inpatient   01/18/2013  3:46 AM 01/19/2013  5:57 PM Full Code [id number]  Orvan Falconer, MD Inpatient   11/22/2011 10:40 PM 11/26/2011  1:21 PM Full Code [id number]  Andreas Blower, RN Inpatient    Advance Directive Documentation        Most Recent Value   Type of Advance Directive  Healthcare Power of Attorney   Pre-existing out of facility DNR order (yellow form or pink MOST form)     "MOST" Form in Place?         Symptom Management:   SOB/pain: Oxycodone 5 mg every 6 hours prn. Use as trial to better manage SOB.   Anxiety: Continue Xanax as ordered.   Palliative Prophylaxis:   Bowel Regimen, Frequent Pain Assessment and Oral Care  Additional Recommendations (Limitations, Scope, Preferences):  Avoid Hospitalization and No Hemodialysis  Psycho-social/Spiritual:  Support System: Adequate Desire for further Chaplaincy support:yes Additional Recommendations: Caregiving  Support/Resources and Education on Hospice  Prognosis: Unable to determine - poor prognosis with multiple comorbitities.   Discharge Planning: Home with Palliative Services and home health.    Chief Complaint/ Primary Diagnoses: Present on Admission:  . HYPERCHOLESTEROLEMIA . Iron deficiency anemia due to chronic blood loss . COPD exacerbation ([place]) . Autoimmune hepatitis  ([place]) . Essential hypertension . Coronary atherosclerosis . GERD . Obesity, Class II, BMI 35-39.9, with comorbidity ([place]) . GAVE (gastric antral vascular ectasia) . Other pancytopenia ([place]) . CKD stage 3 secondary to diabetes ([place]) . Hepatic cirrhosis ([place]) . Acute on chronic diastolic (congestive) heart failure ([place]) . Acute on chronic respiratory failure ([place])  I have reviewed the medical record, interviewed the patient and family, and examined the patient. The following aspects are pertinent.  Past Medical History  Diagnosis Date  . Chronic airway obstruction, not elsewhere classified   . Unspecified chronic bronchitis ([place])   . Unspecified essential hypertension   . Non-obstructive CAD   . Palpitations   . Mild aortic stenosis     a. 03/2014 Valve area (VTI): 2.89 cm^2, Valve area (Vmax): 2.7 cm^2.  . Pure hypercholesterolemia   . Morbid obesity ([place])   . Esophageal reflux   .  GAVE (gastric antral vascular ectasia)     angiodysplasia; s/p multiple APCs, discussing RFA with [facility] Dr Newman Pies (06/2015)  . Diverticulosis of colon (without mention of hemorrhage)   . Benign neoplasm of colon   . Osteoarthrosis, unspecified whether generalized or localized, unspecified site   . Osteoporosis, unspecified   . Restless legs syndrome (RLS)   . Major depressive disorder, recurrent episode, severe, specified as with psychotic behavior   . Iron deficiency anemia secondary to blood loss (chronic)     a. frequent PRBC transfusions.  . Coarse tremors     a. arms.  . Carpal tunnel syndrome on both sides   . Chronic diastolic CHF (congestive heart failure) ([place])     a. 03/26/2014 Echo: EF 55-60%, no rwma, mild AS/AI, mod-sev Ca2+ MV annulus, mildly to mod dil LA.  [name] Asthma   . Type II diabetes mellitus ([place])   . H/O hiatal hernia   . 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 dx'd 1990's  . Adenomatous colon polyp   . Midsternal chest pain     a.  conservatively managed ->poor candidate for cath/anticoagulation.  [name] Anxiety   . Portal hypertensive gastropathy   . Falls frequently     completed HHPT/OT 06/2014, PT unmet goals  . Recurrent UTI (urinary tract infection)     h/o hospitalization with urosepsis 2015, but thought large component colonization/bacteriuria, only treat if symptomatic ([place])  . Hiatal hernia   . Thrombocytopenia ([place])   . Protein calorie malnutrition ([place])   . On home oxygen therapy     "3L; 24/7" (11/02/2015)  . OSA (obstructive sleep apnea)     a. does not use CPAP. (01/25/2015)  . Hepatitis   . CAP (community acquired pneumonia) 2015   Social History   Social History  . Marital Status: Married    Spouse Name: jerry Dealmeida  . Number of Children: 4  . Years of Education: 12   Occupational History  . Disabled   .     Social History Main Topics  . Smoking status: Former Smoker -- 1.50 packs/day for 25 years    Types: Cigarettes    Quit date: 08/26/1992  . Smokeless tobacco: Never Used  . Alcohol Use: No     Comment: 2/68/34 "last alcoholic drink was years ago"  . Drug Use: No  . Sexual Activity: No   Other Topics Concern  . None   Social History Narrative   Patient lives at home with her husband [name]).    Retired.   Caffeine- None    Right handed.      Dr. Olevia Perches now [facility] (referred to [facility])   Dr. Burt Knack cards    Dr. Loanne Drilling endo   Dr Lenna Gilford pulm   Psych: Followed by [facility] (ph [phone], fax [phone]).    Family History  Problem Relation Age of Onset  . Heart disease Mother   . Cervical cancer Mother   . Kidney disease Mother   . Diabetes Mother   . Breast cancer Sister   . Multiple sclerosis Sister   . Colon cancer Neg Hx   . Breast cancer Maternal Aunt     x 2  . Diabetes Father   . Diabetes Sister   . Multiple sclerosis Other   . Heart attack Mother   . Heart attack Father   . Stroke Daughter   . Stroke Daughter    Scheduled  Meds: .  B-complex with vitamin C  1 tablet Oral Daily  . buPROPion  150 mg Oral Daily  . desvenlafaxine  150 mg Oral Daily  . ferrous sulfate  325 mg Oral BID WC  . furosemide  40 mg Intravenous Q12H  . guaiFENesin  600 mg Oral BID  . insulin aspart  0-9 Units Subcutaneous TID WC  . insulin aspart  8 Units Subcutaneous TID WC  . insulin glargine  120 Units Subcutaneous Daily  . ipratropium-albuterol  3 mL Nebulization TID  . lactulose  20 g Oral QPM  . loratadine  10 mg Oral Daily  . multivitamin with minerals  1 tablet Oral q1800  . mupirocin cream  1 application Topical BID  . pantoprazole  40 mg Oral BID  . potassium chloride  40 mEq Oral Daily  . [START ON 11/06/2015] predniSONE  20 mg Oral Q breakfast  . pregabalin  50 mg Oral BID  . QUEtiapine  200 mg Oral QHS  . rifaximin  550 mg Oral BID  . rOPINIRole  4 mg Oral QHS  . simvastatin  40 mg Oral q1800  . sodium chloride flush  3 mL Intravenous Q12H  . Vitamin D (Ergocalciferol)  50,000 Units Oral Q7 days   Continuous Infusions:  PRN Meds:.sodium chloride, albuterol, ALPRAZolam, oxyCODONE, sodium chloride flush, zolpidem Medications Prior to Admission:  Prior to Admission medications   Medication Sig Start Date End Date Taking? Authorizing Provider  ADVAIR DISKUS 250-50 MCG/DOSE AEPB INHALE 1 PUFF INTO LUNGS TWICE A DAY 05/29/15  Yes [name] Bush, MD  albuterol (PROAIR HFA) 108 (90 BASE) MCG/ACT inhaler Inhale 2 puffs into the lungs every 6 (six) hours as needed for wheezing or shortness of breath. Reported on 08/18/2015   Yes Historical Provider, MD  ALPRAZolam Duanne Moron) 1 MG tablet Take 1 tablet (1 mg total) by mouth 2 (two) times daily as needed for anxiety or sleep. 10/17/15  Yes [name] Bush, MD  B Complex-C ([employer] B COMPLEX PLUS C) TABS TAKE ONE CAPSULE BY MOUTH EVERY DAY 04/17/15  Yes [name] Bush, MD  buPROPion (WELLBUTRIN XL) 150 MG 24 hr tablet Take 150 mg by mouth daily.   Yes Historical Provider, MD   desvenlafaxine (PRISTIQ) 50 MG 24 hr tablet Take 3 tablets (150 mg total) by mouth daily. 05/12/15  Yes [name] Bush, MD  ferrous sulfate 325 (65 FE) MG tablet Take 1 tablet (325 mg total) by mouth daily with breakfast. 06/15/15  Yes Manus Gunning, MD  furosemide (LASIX) 80 MG tablet Take one and one-half tablet by mouth twice a day Patient taking differently: Take 120 mg by mouth 2 (two) times daily. Take one and one-half tablet by mouth twice a day 10/27/15  Yes Sherren Mocha, MD  guaiFENesin (MUCINEX) 600 MG 12 hr tablet Take 600 mg by mouth 2 (two) times daily.    Yes Historical Provider, MD  Insulin Glargine (LANTUS SOLOSTAR) 100 UNIT/ML Solostar Pen Inject 120 Units into the skin every morning. 04/13/15  Yes Renato Shin, MD  insulin lispro (HUMALOG KWIKPEN) 100 UNIT/ML KiwkPen Inject 10 Units into the skin daily with supper.    Yes Historical Provider, MD  lactulose, encephalopathy, (CHRONULAC) 10 GM/15ML SOLN Take 20 g by mouth every evening. I have instructed pt to begin taking 30 ml of this at least once a day and if she can tolerate a second dose without too many BMs to take a 2nd dose   Yes Historical Provider, MD  levalbuterol Penne Lash) 0.63 MG/3ML  nebulizer solution Take 0.63 mg by nebulization every 8 (eight) hours as needed for wheezing or shortness of breath.  06/05/14  Yes Historical Provider, MD  loratadine (CLARITIN) 10 MG tablet TAKE 1 TABLET BY MOUTH EVERY DAY 07/14/15  Yes [name] Bush, MD  Multiple Vitamins-Minerals (CENTRUM SILVER PO) Take 1 tablet by mouth daily.    Yes Historical Provider, MD  mupirocin cream (BACTROBAN) 2 % Apply 1 application topically 2 (two) times daily.   Yes Historical Provider, MD  oxycodone (OXY-IR) 5 MG capsule Take 1 capsule (5 mg total) by mouth 2 (two) times daily as needed for pain. 10/17/15  Yes [name] Bush, MD  pantoprazole (PROTONIX) 40 MG tablet TAKE 1 TABLET BY MOUTH TWICE A DAY 10/12/15  Yes [name] Bush, MD   promethazine (PHENERGAN) 25 MG tablet Take 25 mg by mouth 2 (two) times daily as needed for nausea or vomiting.   Yes Historical Provider, MD  QUEtiapine (SEROQUEL) 200 MG tablet TAKE 1 TABLET BY MOUTH AT BEDTIME 08/15/15  Yes [name] Bush, MD  rOPINIRole (REQUIP) 4 MG tablet Take 4 mg by mouth at bedtime.   Yes Historical Provider, MD  simvastatin (ZOCOR) 40 MG tablet Take 40 mg by mouth daily.   Yes Historical Provider, MD  Vitamin D, Ergocalciferol, (DRISDOL) 50000 units CAPS capsule TAKE ONE CAPSULE WEEKLY Patient taking differently: TAKE ONE CAPSULE WEEKLY ON FRIDAY 10/23/15  Yes [name] Bush, MD  carvedilol (COREG) 3.125 MG tablet TAKE AS DIRECTED - 1/2 TABLET DAILY 11/02/15   [name] Bush, MD  glucose blood (ONE TOUCH ULTRA TEST) test strip Check blood sugar 4 times daily 10/16/15   [name] Bush, MD  Insulin Pen Needle (B-D ULTRAFINE III SHORT PEN) 31G X 8 MM MISC Use as directed to inject insulin twice daily. Dx: E11.8 10/13/15   Renato Shin, MD  metolazone (ZAROXOLYN) 2.5 MG tablet Take as directed for 4 lb weight gain 10/27/15   Sherren Mocha, MD  rifaximin (XIFAXAN) 550 MG TABS tablet Take 550 mg by mouth 2 (two) times daily. Reported on 10/17/2015    Historical Provider, MD   Allergies  Allergen Reactions  . Acetaminophen Other (See Comments)    Liver dysfunction  . Nsaids Other (See Comments)    Liver dysfunction   . Aspirin Other (See Comments)    Causes nosebleeds  . Theophylline Nausea And Vomiting  . Penicillin G Itching  . Penicillins Itching    Has patient had a PCN reaction causing immediate rash, facial/tongue/throat swelling, SOB or lightheadedness with hypotension: unknown Has patient had a PCN reaction causing severe rash involving mucus membranes or skin necrosis: unknown Has patient had a PCN reaction that required hospitalization unknown Has patient had a PCN reaction occurring within the last 10 years: yes If all of the above answers are "NO", then  may proceed with Cephalosporin use.     Review of Systems  Physical Exam  Vital Signs: BP 134/30 mmHg  Pulse 70  Temp(Src) 97.8 F (36.6 C) (Oral)  Resp 18  Wt 101.107 kg (222 lb 14.4 oz)  SpO2 96%  SpO2: SpO2: 96 % O2 Device:SpO2: 96 % O2 Flow Rate: .O2 Flow Rate (L/min): 3 L/min  IO: Intake/output summary:  Intake/Output Summary (Last 24 hours) at 11/05/15 1643 Last data filed at 11/05/15 1300  Gross per 24 hour  Intake    480 ml  Output   2650 ml  Net  -2170 ml    LBM: Last BM Date: 11/04/15 Baseline Weight: Weight: 104.327  kg (230 lb) Most recent weight: Weight: 101.107 kg (222 lb 14.4 oz)      Palliative Assessment/Data:    Additional Data Reviewed:  CBC:    Component Value Date/Time   WBC 3.3* 11/05/2015 0553   HGB 7.7* 11/05/2015 0553   HGB 7.3 02/21/2015   HCT 25.3* 11/05/2015 0553   PLT 59* 11/05/2015 0553   PLT 67 02/21/2015   MCV 100.8* 11/05/2015 0553   NEUTROABS 3.8 08/28/2015 0521   LYMPHSABS 0.4* 08/28/2015 0521   MONOABS 0.6 08/28/2015 0521   EOSABS 0.1 08/28/2015 0521   BASOSABS 0.0 08/28/2015 0521   Comprehensive Metabolic Panel:    Component Value Date/Time   NA 139 11/05/2015 0553   NA 136 02/21/2015   K 3.7 11/05/2015 0553   K 4.0 02/21/2015   CL 98* 11/05/2015 0553   CO2 30 11/05/2015 0553   BUN 38* 11/05/2015 0553   BUN 78* 02/21/2015   CREATININE 1.20* 11/05/2015 0553   CREATININE 1.07* 10/16/2015 1105   GLUCOSE 331* 11/05/2015 0553   GLUCOSE 256* 09/04/2006 1603   CALCIUM 9.0 11/05/2015 0553   AST 29 11/03/2015 0300   ALT 28 11/03/2015 0300   ALKPHOS 102 11/03/2015 0300   BILITOT 1.0 11/03/2015 0300   PROT 5.6* 11/03/2015 0300   ALBUMIN 3.1* 11/03/2015 0300     Time In: 1430 Time Out: 1600 Time Total: 28mn Greater than 50%  of this time was spent counseling and coordinating care related to the above assessment and plan.  Signed by: PPershing Proud NP  APershing Proud NP  39/74/1638 4:43 PM  Please  contact Palliative Medicine Team phone at [phone] questions and concerns.

## 2015-11-05 NOTE — Progress Notes (Signed)
Follow-Up Xifaxan Benefit: Co-pay $839.58 after $179.00 Deductible which HAS been met. CM called this patient to assess if she is aware of the assistance programs available through manufacturer. She reports [facility] RN assisted her to complete application and it was mailed earlier this week. Will be available should further assistance be needed .

## 2015-11-06 DIAGNOSIS — K31819 Angiodysplasia of stomach and duodenum without bleeding: Secondary | ICD-10-CM

## 2015-11-06 LAB — BASIC METABOLIC PANEL
Anion gap: 9 (ref 5–15)
BUN: 33 mg/dL — AB (ref 6–20)
CALCIUM: 9.2 mg/dL (ref 8.9–10.3)
CHLORIDE: 102 mmol/L (ref 101–111)
CO2: 31 mmol/L (ref 22–32)
CREATININE: 1.23 mg/dL — AB (ref 0.44–1.00)
GFR calc non Af Amer: 43 mL/min — ABNORMAL LOW (ref >60)
GFR, EST AFRICAN AMERICAN: 50 mL/min — AB (ref >60)
GLUCOSE: 161 mg/dL — AB (ref 65–99)
Potassium: 4 mmol/L (ref 3.5–5.1)
Sodium: 142 mmol/L (ref 135–145)

## 2015-11-06 LAB — CBC
HEMATOCRIT: 27.8 % — AB (ref 36.0–46.0)
HEMOGLOBIN: 8.3 g/dL — AB (ref 12.0–15.0)
MCH: 30.7 pg (ref 26.0–34.0)
MCHC: 29.9 g/dL — AB (ref 30.0–36.0)
MCV: 103 fL — AB (ref 78.0–100.0)
Platelets: 66 10*3/uL — ABNORMAL LOW (ref 150–400)
RBC: 2.7 MIL/uL — ABNORMAL LOW (ref 3.87–5.11)
RDW: 15.7 % — AB (ref 11.5–15.5)
WBC: 3.8 10*3/uL — ABNORMAL LOW (ref 4.0–10.5)

## 2015-11-06 LAB — CULTURE, BLOOD (ROUTINE X 2)
CULTURE: NO GROWTH
Culture: NO GROWTH

## 2015-11-06 LAB — GLUCOSE, CAPILLARY
GLUCOSE-CAPILLARY: 288 mg/dL — AB (ref 65–99)
Glucose-Capillary: 105 mg/dL — ABNORMAL HIGH (ref 65–99)

## 2015-11-06 MED ORDER — POTASSIUM CHLORIDE CRYS ER 20 MEQ PO TBCR: 40.0000 meq | EXTENDED_RELEASE_TABLET | Freq: Every day | ORAL | Status: DC

## 2015-11-06 MED ORDER — TORSEMIDE 20 MG PO TABS
40.0000 mg | ORAL_TABLET | Freq: Every day | ORAL | Status: DC
Start: 2015-11-06 — End: 2015-11-06
  Administered 2015-11-06: 40 mg via ORAL
  Filled 2015-11-06: qty 2

## 2015-11-06 MED ORDER — OXYCODONE HCL 5 MG PO CAPS: 5.0000 mg | ORAL_CAPSULE | Freq: Four times a day (QID) | ORAL | Status: DC | PRN

## 2015-11-06 MED ORDER — PREDNISONE 10 MG PO TABS: ORAL_TABLET | ORAL | Status: DC

## 2015-11-06 MED ORDER — TORSEMIDE 20 MG PO TABS
40.0000 mg | ORAL_TABLET | Freq: Every day | ORAL | Status: DC
Start: 2015-11-06 — End: 2015-12-08

## 2015-11-06 MED ORDER — PREGABALIN 50 MG PO CAPS
50.0000 mg | ORAL_CAPSULE | Freq: Two times a day (BID) | ORAL | Status: DC
Start: 2015-11-06 — End: 2015-11-13

## 2015-11-06 NOTE — Care Management Important Message (Signed)
Important Message  Patient Details  Name: [name] MRN: [id number] Date of Birth: 02-09-1941   Medicare Important Message Given:  Yes    Nathen May 11/06/2015, 9:50 AM

## 2015-11-06 NOTE — Care Management Note (Signed)
Case Management Note Previous CM note initiated by Jonnie Finner RN, CM  Patient Details  Name: [name] MRN: [id number] Date of Birth: Jan 15, 1941  Subjective/Objective:    COPD exacerbation                Action/Plan: NCM spoke to pt. States she is active with [place] for [facility] PT. She has RW, neb machine, and oxygen at home. NCM instructed pt to make sure family brings portable at time of dc. [facility] aware of IP stay. Pt is followed by [facility] Disease Management program. Will need resumption of care orders.    Expected Discharge Date:   11/06/15               Expected Discharge Plan:  [place]  In-House Referral:  NA  Discharge planning Services  CM Consult  Post Acute Care Choice:  Home Health, Resumption of Svcs/PTA Provider Choice offered to:  Patient  DME Arranged:  N/A DME Agency:  NA  HH Arranged:  PT, RN HH Agency:  [place]  Status of Service:  Completed, signed off  Medicare Important Message Given:  Yes Date Medicare IM Given:    Medicare IM give by:    Date Additional Medicare IM Given:    Additional Medicare Important Message give by:     If discussed at [place] of Stay Meetings, dates discussed:    Discharge Disposition: Home/home health   Additional Comments:  11/06/15- [place] RN, BSN- pt for d/c home today- orders placed for resumption of HHPT will addition of HHRN- spoke with Stanton Kidney from [facility]- regarding [place] needs- and pending d/c today- per MD pt will also need PC consult to [facility] - call placed to [facility] and referral made- pt already active with [facility] services-    Dawayne Patricia, RN 11/06/2015, 12:45 PM

## 2015-11-06 NOTE — Progress Notes (Signed)
Pt. Discharged to home with [facility] Pt. D/C'd via wheelchair with husband Discharge information reviewed and given All personal belongings given to Pt.  Education discussed and reviewed  IV was d/c and intact upon removal Tele d/c

## 2015-11-06 NOTE — Progress Notes (Signed)
Occupational Therapy Treatment Patient Details Name: [name] MRN: [id number] DOB: April 24, 1941 Today's Date: 11/06/2015    History of present illness Pt admitted with cough, SOB, LE edema, abdominal pain. PMH: DM, HLD, COPD, asthma, anemia, CKD, NASH, depression, anxiety. Pt is on 3 L 02 at home.   OT comments  Patient progressing towards OT goals, continue plan of care for now. See below, under ADL comment for information regarding this OT treatment.    Follow Up Recommendations  No OT follow up;Supervision/Assistance - 24 hour    Equipment Recommendations  None recommended by OT;Tub/shower bench (bench IF pt decides she wants one, refused during OT treatment)    Recommendations for Other Services  None at this time   Precautions / Restrictions Precautions Precautions: Fall Precaution Comments: watch 02, chronic oxygen - 3L/min Restrictions Weight Bearing Restrictions: No    Mobility Bed Mobility General bed mobility comments: Pt seated EOB upon OT entering/exiting room  Transfers Overall transfer level: Needs assistance Equipment used: Rolling walker (2 wheeled) Transfers: Sit to/from Stand Sit to Stand: Supervision General transfer comment: supervision for safety    Balance Overall balance assessment: Needs assistance Sitting-balance support: No upper extremity supported;Feet supported Sitting balance-Leahy Scale: Good     Standing balance support: During functional activity;No upper extremity supported Standing balance-Leahy Scale: Fair Standing balance comment: Standing at sink for washing hands with no UE support. Pt with generalized weakness in BLE and benefits from RW UE support for safety.    ADL Overall ADL's : Needs assistance/impaired Eating/Feeding: Set up;Sitting   Grooming: Wash/dry hands;Supervision/safety;Standing   Upper Body Bathing: Sitting;Set up   Lower Body Bathing: Supervison/ safety;Sit to/from stand   Upper Body Dressing :  Sitting;Set up   Lower Body Dressing: Supervision/safety;Sit to/from stand   Toilet Transfer: Animal nutritionist Details (indicate cue type and reason): Pt performed both stand pivot transfer to [facility] and ambulated to BR for transfer to/from regular height toilet seat Toileting- Clothing Manipulation and Hygiene: Supervision/safety;Sit to/from stand     Tub/Shower Transfer Details (indicate cue type and reason): Did not occur. Pt states she normally just sponge bathes at home due to fear of falling getting in/out of shower. Educated pt on tub transfer bench she could use in tub/shower. Pt stated, "if my nurse thinks I need to shower, then maybe I'll get one". Therapist explained reason for educating her on this is she if she wanted to shower and if showering was important to her, OT's job is to educate pt on how she can be independent and safe with this.  Functional mobility during ADLs: Supervision/safety;Rolling walker General ADL Comments: Pt somewhat limited by SOB. Pt on 3L/min supplemental 02 via [place] entire session. After ambulating to/from BR, sats decreased to as low as 87%. Sats quickly increased with pursed lip breathing and seated rest break.      Cognition   Behavior During Therapy: WFL for tasks assessed/performed;Agitated Overall Cognitive Status: Within Functional Limits for tasks assessed                 Pertinent Vitals/ Pain       Pain Assessment: No/denies pain   Frequency Min 2X/week     Progress Toward Goals  OT Goals(current goals can now befound in the care plan section)  Progress towards OT goals: Progressing toward goals  Acute Rehab OT Goals Patient Stated Goal: return home OT Goal Formulation: With patient/family Time For Goal Achievement: 11/09/15 Potential to Achieve Goals: Good  Plan  Discharge plan remains appropriate       End of Session Equipment Utilized During Treatment: Rolling  walker;Oxygen   Activity Tolerance Patient tolerated treatment well   Patient Left in bed;with call bell/phone within reach;with family/visitor present     Time: [id number] OT Time Calculation (min): 14 min  Charges: OT General Charges $OT Visit: 1 Procedure OT Treatments $Self Care/Home Management : 8-22 mins  Chrys Racer , MS, OTR/L, CLT Pager: [phone]  11/06/2015, 12:01 PM

## 2015-11-06 NOTE — Progress Notes (Signed)
Sent referral to [facility] at request of Fenton Malling, NP. Larina Earthly, RN, BSN, [facility] 11/06/2015 1:18 PM Cell [phone] 8:00-4:00 Monday-Friday Office [phone]

## 2015-11-06 NOTE — Consult Note (Signed)
   [facility] CM Inpatient Consult   11/06/2015  [name] October 09, 1940 [id number]   Made aware, prior to discharge, that inpatient RNCM made [facility] home base referral. Patient discharging home today. She is also active with [place] [facility] as well. Will make [place] aware of referral to [facility] and request follow up.  Marthenia Rolling, MSN-Ed, RN,BSN [facility] Liaison [phone]

## 2015-11-06 NOTE — Discharge Summary (Signed)
PATIENT DETAILS Name: [name] Age: 75 y.o. Sex: female Date of Birth: 07/29/1941 MRN: [id number]. Admitting Physician: Ivor Costa, MD [id number] Danise Mina, MD  Admit Date: 11/01/2015 Discharge date: 11/06/2015  Recommendations for Outpatient Follow-up:  1. Case Management to arrange for palliative care follow up at home 2. Please repeat BMET at next visit.   PRIMARY DISCHARGE DIAGNOSIS:  Principal Problem:   Acute on chronic respiratory failure (HCC) Active Problems:   HYPERCHOLESTEROLEMIA   Iron deficiency anemia due to chronic blood loss   Essential hypertension   Coronary atherosclerosis   COPD exacerbation (HCC)   GERD   Autoimmune hepatitis (HCC)   Obesity, Class II, BMI 35-39.9, with comorbidity (HCC)   GAVE (gastric antral vascular ectasia)   Other pancytopenia (HCC)   CKD stage 3 secondary to diabetes (HCC)   Hepatic cirrhosis (HCC)   Acute on chronic diastolic (congestive) heart failure (HCC)   Diabetes ([place])   Palliative care encounter   Acute on chronic diastolic congestive heart failure ([place])      PAST MEDICAL HISTORY: Past Medical History  Diagnosis Date  . Chronic airway obstruction, not elsewhere classified   . Unspecified chronic bronchitis ([place])   . Unspecified essential hypertension   . Non-obstructive CAD   . Palpitations   . Mild aortic stenosis     a. 03/2014 Valve area (VTI): 2.89 cm^2, Valve area (Vmax): 2.7 cm^2.  . Pure hypercholesterolemia   . Morbid obesity ([place])   . Esophageal reflux   . GAVE (gastric antral vascular ectasia)     angiodysplasia; s/p multiple APCs, discussing RFA with [facility] GI Dr Newman Pies (06/2015)  . Diverticulosis of colon (without mention of hemorrhage)   . Benign neoplasm of colon   . Osteoarthrosis, unspecified whether generalized or localized, unspecified site   . Osteoporosis, unspecified   . Restless legs syndrome (RLS)   . Major depressive disorder, recurrent episode, severe, specified as with  psychotic behavior   . Iron deficiency anemia secondary to blood loss (chronic)     a. frequent PRBC transfusions.  . Coarse tremors     a. arms.  . Carpal tunnel syndrome on both sides   . Chronic diastolic CHF (congestive heart failure) ([place])     a. 03/26/2014 Echo: EF 55-60%, no rwma, mild AS/AI, mod-sev Ca2+ MV annulus, mildly to mod dil LA.  [name] Asthma   . Type II diabetes mellitus ([place])   . H/O hiatal hernia   . 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 dx'd 1990's  . Adenomatous colon polyp   . Midsternal chest pain     a. conservatively managed ->poor candidate for cath/anticoagulation.  [name] Anxiety   . Portal hypertensive gastropathy   . Falls frequently     completed HHPT/OT 06/2014, PT unmet goals  . Recurrent UTI (urinary tract infection)     h/o hospitalization with urosepsis 2015, but thought large component colonization/bacteriuria, only treat if symptomatic ([place])  . Hiatal hernia   . Thrombocytopenia ([place])   . Protein calorie malnutrition ([place])   . On home oxygen therapy     "3L; 24/7" (11/02/2015)  . OSA (obstructive sleep apnea)     a. does not use CPAP. (01/25/2015)  . Hepatitis   . CAP (community acquired pneumonia) 2015    DISCHARGE MEDICATIONS: Current Discharge Medication List    START taking these medications   Details  potassium chloride SA (K-DUR,KLOR-CON) 20 MEQ tablet Take 2 tablets (40 mEq total)  by mouth daily. Qty: 30 tablet, Refills: 0    predniSONE (DELTASONE) 10 MG tablet 2 tablets daily for 1 day, then, 1 tablet daily for 1 day and then stop Qty: 3 tablet, Refills: 0    pregabalin (LYRICA) 50 MG capsule Take 1 capsule (50 mg total) by mouth 2 (two) times daily. Qty: 60 capsule, Refills: 0    torsemide (DEMADEX) 20 MG tablet Take 2 tablets (40 mg total) by mouth daily. Qty: 60 tablet, Refills: 0      CONTINUE these medications which have CHANGED   Details  oxycodone (OXY-IR) 5 MG capsule Take 1 capsule (5  mg total) by mouth every 6 (six) hours as needed for pain. Qty: 30 capsule, Refills: 0      CONTINUE these medications which have NOT CHANGED   Details  ADVAIR DISKUS 250-50 MCG/DOSE AEPB INHALE 1 PUFF INTO LUNGS TWICE A DAY Qty: 60 each, Refills: 11    albuterol (PROAIR HFA) 108 (90 BASE) MCG/ACT inhaler Inhale 2 puffs into the lungs every 6 (six) hours as needed for wheezing or shortness of breath. Reported on 08/18/2015    ALPRAZolam (XANAX) 1 MG tablet Take 1 tablet (1 mg total) by mouth 2 (two) times daily as needed for anxiety or sleep. Qty: 30 tablet, Refills: 0    B Complex-C ([employer] B COMPLEX PLUS C) TABS TAKE ONE CAPSULE BY MOUTH EVERY DAY Qty: 30 tablet, Refills: 5    buPROPion (WELLBUTRIN XL) 150 MG 24 hr tablet Take 150 mg by mouth daily.    desvenlafaxine (PRISTIQ) 50 MG 24 hr tablet Take 3 tablets (150 mg total) by mouth daily. Qty: 90 tablet, Refills: 11    ferrous sulfate 325 (65 FE) MG tablet Take 1 tablet (325 mg total) by mouth daily with breakfast. Qty: 90 tablet, Refills: 1    guaiFENesin (MUCINEX) 600 MG 12 hr tablet Take 600 mg by mouth 2 (two) times daily.     Insulin Glargine (LANTUS SOLOSTAR) 100 UNIT/ML Solostar Pen Inject 120 Units into the skin every morning. Qty: 20 pen, Refills: 11    insulin lispro (HUMALOG KWIKPEN) 100 UNIT/ML KiwkPen Inject 10 Units into the skin daily with supper.     lactulose, encephalopathy, (CHRONULAC) 10 GM/15ML SOLN Take 20 g by mouth every evening. I have instructed pt to begin taking 30 ml of this at least once a day and if she can tolerate a second dose without too many BMs to take a 2nd dose    levalbuterol (XOPENEX) 0.63 MG/3ML nebulizer solution Take 0.63 mg by nebulization every 8 (eight) hours as needed for wheezing or shortness of breath.  Refills: 5    loratadine (CLARITIN) 10 MG tablet TAKE 1 TABLET BY MOUTH EVERY DAY Qty: 30 tablet, Refills: 11    Multiple Vitamins-Minerals (CENTRUM SILVER PO) Take 1 tablet by  mouth daily.     mupirocin cream (BACTROBAN) 2 % Apply 1 application topically 2 (two) times daily.    pantoprazole (PROTONIX) 40 MG tablet TAKE 1 TABLET BY MOUTH TWICE A DAY Qty: 60 tablet, Refills: 11    promethazine (PHENERGAN) 25 MG tablet Take 25 mg by mouth 2 (two) times daily as needed for nausea or vomiting.    QUEtiapine (SEROQUEL) 200 MG tablet TAKE 1 TABLET BY MOUTH AT BEDTIME Qty: 30 tablet, Refills: 0    rOPINIRole (REQUIP) 4 MG tablet Take 4 mg by mouth at bedtime.    simvastatin (ZOCOR) 40 MG tablet Take 40 mg by mouth daily.  Vitamin D, Ergocalciferol, (DRISDOL) 50000 units CAPS capsule TAKE ONE CAPSULE WEEKLY Qty: 4 capsule, Refills: 6    carvedilol (COREG) 3.125 MG tablet TAKE AS DIRECTED - 1/2 TABLET DAILY Qty: 30 tablet, Refills: 3    glucose blood (ONE TOUCH ULTRA TEST) test strip Check blood sugar 4 times daily Qty: 200 each, Refills: 3    Insulin Pen Needle (B-D ULTRAFINE III SHORT PEN) 31G X 8 MM MISC Use as directed to inject insulin twice daily. Dx: E11.8 Qty: 200 each, Refills: 2    metolazone (ZAROXOLYN) 2.5 MG tablet Take as directed for 4 lb weight gain Qty: 30 tablet, Refills: 0   Associated Diagnoses: Chronic diastolic heart failure (HCC)    rifaximin (XIFAXAN) 550 MG TABS tablet Take 550 mg by mouth 2 (two) times daily. Reported on 10/17/2015      STOP taking these medications     furosemide (LASIX) 80 MG tablet         ALLERGIES:   Allergies  Allergen Reactions  . Acetaminophen Other (See Comments)    Liver dysfunction  . Nsaids Other (See Comments)    Liver dysfunction   . Aspirin Other (See Comments)    Causes nosebleeds  . Theophylline Nausea And Vomiting  . Penicillin G Itching  . Penicillins Itching    Has patient had a PCN reaction causing immediate rash, facial/tongue/throat swelling, SOB or lightheadedness with hypotension: unknown Has patient had a PCN reaction causing severe rash involving mucus membranes or skin  necrosis: unknown Has patient had a PCN reaction that required hospitalization unknown Has patient had a PCN reaction occurring within the last 10 years: yes If all of the above answers are "NO", then may proceed with Cephalosporin use.     BRIEF HPI:  See H&P, Labs, Consult and Test reports for all details in brief, patient  is a 75 y.o. female with PMH of diabetes mellitus, hyperlipidemia, COPD, asthma, GERD, depression, anxiety, diastolic congestive heart failure, liver cirrhosis, autoimmune hepatitis, OSA not on CPAP, GAVE (gastric antral vascular ectasia), anemia, chronic kidney disease-stage III, who presented with cough, shortness of breath, leg swelling.In ED Chest x-ray showed pulmonary vascular congestion and bronchitis change.  CONSULTATIONS:   Palliative care  PERTINENT RADIOLOGIC STUDIES: Dg Chest 2 View  11/01/2015  CLINICAL DATA:  Shortness of Breath for 4 days, worsening.  Cough. EXAM: CHEST  2 VIEW COMPARISON:  08/28/2015 FINDINGS: Cardiomegaly with vascular congestion. Linear subsegmental atelectasis or scarring at the right lung base. No confluent opacity on the left. No effusions. No acute bony abnormality. Mild peribronchial thickening. IMPRESSION: Cardiomegaly, vascular congestion and mild bronchitic changes. Right base atelectasis. Electronically Signed   By: Rolm Baptise M.D.   On: 11/01/2015 13:46     PERTINENT LAB RESULTS: CBC:  Recent Labs  11/05/15 0553 11/06/15 0240  WBC 3.3* 3.8*  HGB 7.7* 8.3*  HCT 25.3* 27.8*  PLT 59* 66*   CMET CMP     Component Value Date/Time   NA 142 11/06/2015 0240   NA 136 02/21/2015   K 4.0 11/06/2015 0240   K 4.0 02/21/2015   CL 102 11/06/2015 0240   CO2 31 11/06/2015 0240   GLUCOSE 161* 11/06/2015 0240   GLUCOSE 256* 09/04/2006 1603   BUN 33* 11/06/2015 0240   BUN 78* 02/21/2015   CREATININE 1.23* 11/06/2015 0240   CREATININE 1.07* 10/16/2015 1105   CALCIUM 9.2 11/06/2015 0240   PROT 5.6* 11/03/2015 0300    ALBUMIN 3.1* 11/03/2015 0300  AST 29 11/03/2015 0300   ALT 28 11/03/2015 0300   ALKPHOS 102 11/03/2015 0300   BILITOT 1.0 11/03/2015 0300   GFRNONAA 43* 11/06/2015 0240   GFRAA 50* 11/06/2015 0240    GFR Estimated Creatinine Clearance: 46.4 mL/min (by C-G formula based on Cr of 1.23). No results for input(s): LIPASE, AMYLASE in the last 72 hours. No results for input(s): CKTOTAL, CKMB, CKMBINDEX, TROPONINI in the last 72 hours. Invalid input(s): POCBNP No results for input(s): DDIMER in the last 72 hours. No results for input(s): HGBA1C in the last 72 hours. No results for input(s): CHOL, HDL, LDLCALC, TRIG, CHOLHDL, LDLDIRECT in the last 72 hours. No results for input(s): TSH, T4TOTAL, T3FREE, THYROIDAB in the last 72 hours.  Invalid input(s): FREET3 No results for input(s): VITAMINB12, FOLATE, FERRITIN, TIBC, IRON, RETICCTPCT in the last 72 hours. Coags: No results for input(s): INR in the last 72 hours.  Invalid input(s): PT Microbiology: Recent Results (from the past 240 hour(s))  Culture, blood (Routine X 2) w Reflex to ID Panel     Status: None (Preliminary result)   Collection Time: 11/01/15 10:11 PM  Result Value Ref Range Status   Specimen Description BLOOD RIGHT ANTECUBITAL  Final   Special Requests BOTTLES DRAWN AEROBIC AND ANAEROBIC 5CC EA  Final   Culture NO GROWTH 4 DAYS  Final   Report Status PENDING  Incomplete  Culture, blood (Routine X 2) w Reflex to ID Panel     Status: None (Preliminary result)   Collection Time: 11/01/15 10:16 PM  Result Value Ref Range Status   Specimen Description BLOOD LEFT ANTECUBITAL  Final   Special Requests IN PEDIATRIC BOTTLE 4CC  Final   Culture NO GROWTH 4 DAYS  Final   Report Status PENDING  Incomplete  Culture, expectorated sputum-assessment     Status: None   Collection Time: 11/02/15 11:07 AM  Result Value Ref Range Status   Specimen Description SPUTUM  Final   Special Requests NONE  Final   Sputum evaluation THIS  SPECIMEN IS ACCEPTABLE FOR SPUTUM CULTURE  Final   Report Status 11/02/2015 FINAL  Final  Urine culture     Status: None   Collection Time: 11/02/15 11:07 AM  Result Value Ref Range Status   Specimen Description URINE, CATHETERIZED  Final   Special Requests NONE  Final   Culture NO GROWTH 1 DAY  Final   Report Status 11/03/2015 FINAL  Final  Culture, respiratory (NON-Expectorated)     Status: None   Collection Time: 11/02/15 11:07 AM  Result Value Ref Range Status   Specimen Description SPUTUM  Final   Special Requests NONE  Final   Gram Stain   Final    MODERATE WBC PRESENT, PREDOMINANTLY PMN FEW SQUAMOUS EPITHELIAL CELLS PRESENT MODERATE GRAM POSITIVE COCCI IN PAIRS IN CHAINS IN CLUSTERS THIS SPECIMEN IS ACCEPTABLE FOR SPUTUM CULTURE Performed at [facility]    Culture   Final    NORMAL OROPHARYNGEAL FLORA Performed at [facility]    Report Status 11/05/2015 FINAL  Final     BRIEF HOSPITAL COURSE:  Acute on chronic hypoxic respiratory failure: Secondary to COPD exacerbation and acute diastolic heart failure.Suspect anemia also contributing to her shortness of breath. Much improved with nebs, diuretics. On 3 L of O2 at home.   Active Problems: Acute diastolic heart failure: Recent TTE on June 2017 showed EF around 55-60% with grade 2 diastolic dysfunction. Shortness of breath improved-close to baseline, lower extremity edema has resolved.  Initially on IV Lasix- will transition to [facility] on discharge. Weight down to 221 pounds (230 pounds on admission), -12.4 L so far.Follow up with PCP for further close monitoring.   COPD exacerbation: Much better, lungs with good air entry with no rhonchi today. Continue prednisone-for 2 more days and then stop.Continue bronchodilators. On home O2 3 L/m  UTI: Complained of dysuria on admission, urine cultures negative, has completed 3 days of aztreonam. Afebrile and without leukocytosis. Has chronic leukopenia.  Chronic  kidney disease stage II: Close to usual baseline-follow closely.  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 Suspect anemia playing some amount of role and exertional dyspnea. Has required prn transfusion in the past.Hb stable at 8.3 on discharge.  Hypertension: Controlled-only on diuretics  Type 2 diabetes: CBGs much better-as steroids have now been tapered down.Continue Lantus 120 units and usual regimen of NovoLog on discharge.  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 Follow with GI as scheduled.  Hx of GAVE and small bowel AVMs:likley contributing to anemia. No overt bleeding at present. Continue PPI  Peripheral neuropathy: Complains of pain in the lower extremities-suspect peripheral neuropathy secondary to diabetes. Started trial of lyrica while hospitalized-patient very reluctant to use Lyrica-given commercials she has seen about side effects. She would rather want to use prn oxycodone for pain. Her main complaint over the last few days has been neuropathic pain in her lower extremities.  History of depression/anxiety: Stable, without any suicidal or homicidal ideations. Continue Seroquel, Pristiq, Wellbutrin  Dyslipidemia: Continue statin.  Obesity: Counseled regarding importance of weight loss  Palliative care: 75 year old unfortunate female with history of cirrhosis, COPD, chronic diastolic heart failure pancytopenia, chronic GI bleeding from GAVE/AVM's-multiple hospitalizations recently for similar issues-with  shortness of breath which I believe is mostly multifactorial from COPD/CHF and anemia. Moreover, she is also has failure to thrive, chronic nausea and chronic lower ext neuropathic pain that is currently impairing her quality of life. Given multitude of chronic irreversible diseases-and slow decline-she poor overall prognoses. Palliative care was consulted during this hospital  stay-recommendations are for outpatient palliative care follow up. Have spoken with case management-they will arrange.   TODAY-DAY OF DISCHARGE:  Subjective:   [name] today has no headache,no chest abdominal pain,no new weakness tingling or numbness.Her SOB and leg edema are much better. Continues to have issues with intermittent nausea/leg pain that are chronic.   Objective:   Blood pressure 139/51, pulse 61, temperature 98 F (36.7 C), temperature source Oral, resp. rate 18, weight 100.653 kg (221 lb 14.4 oz), SpO2 99 %.  Intake/Output Summary (Last 24 hours) at 11/06/15 1045 Last data filed at 11/06/15 0916  Gross per 24 hour  Intake    960 ml  Output    550 ml  Net    410 ml   Filed Weights   11/04/15 0551 11/05/15 0438 11/06/15 0500  Weight: 102.468 kg (225 lb 14.4 oz) 101.107 kg (222 lb 14.4 oz) 100.653 kg (221 lb 14.4 oz)    Exam Awake Alert, Oriented *3, No new F.N deficits, Normal affect [place].AT,PERRAL Supple Neck,No JVD, No cervical lymphadenopathy appriciated.  Symmetrical Chest wall movement, Good air movement bilaterally, CTAB RRR,No Gallops,Rubs or new Murmurs, No Parasternal Heave +ve B.Sounds, Abd Soft, Non tender, No organomegaly appriciated, No rebound -guarding or rigidity. No Cyanosis, Clubbing or edema, No new Rash or bruise  DISCHARGE CONDITION: Stable  DISPOSITION: Home with home health services  DISCHARGE INSTRUCTIONS:    Activity:  As tolerated with Full fall precautions use walker/cane & assistance as needed  Get Medicines reviewed and adjusted: Please take all your medications with you for your next visit with your Primary MD  Please request your Primary MD to go over all hospital tests and procedure/radiological results at the follow up, please ask your Primary MD to get all Hospital records sent to his/her office.  If you experience worsening of your admission symptoms, develop shortness of breath, life threatening emergency, suicidal  or homicidal thoughts you must seek medical attention immediately by calling 911 or calling your MD immediately  if symptoms less severe.  You must read complete instructions/literature along with all the possible adverse reactions/side effects for all the Medicines you take and that have been prescribed to you. Take any new Medicines after you have completely understood and accpet all the possible adverse reactions/side effects.   Do not drive when taking Pain medications.   Do not take more than prescribed Pain, Sleep and Anxiety Medications  Special Instructions: If you have smoked or chewed Tobacco  in the last 2 yrs please stop smoking, stop any regular Alcohol  and or any Recreational drug use.  Wear Seat belts while driving.  Please note  You were cared for by a hospitalist during your hospital stay. Once you are discharged, your primary care physician will handle any further medical issues. Please note that NO REFILLS for any discharge medications will be authorized once you are discharged, as it is imperative that you return to your primary care physician (or establish a relationship with a primary care physician if you do not have one) for your aftercare needs so that they can reassess your need for medications and monitor your lab values.   Diet recommendation: Diabetic Diet Heart Healthy diet   Discharge Instructions    (HEART FAILURE PATIENTS) Call MD:  Anytime you have any of the following symptoms: 1) 3 pound weight gain in 24 hours or 5 pounds in 1 week 2) shortness of breath, with or without a dry hacking cough 3) swelling in the hands, feet or stomach 4) if you have to sleep on extra pillows at night in order to breathe.    Complete by:  As directed      Call MD for:  difficulty breathing, headache or visual disturbances    Complete by:  As directed      Call MD for:  severe uncontrolled pain    Complete by:  As directed      Diet - low sodium heart healthy    Complete  by:  As directed      Diet Carb Modified    Complete by:  As directed      Increase activity slowly    Complete by:  As directed            Follow-up Information    Follow up with [facility].   Why:  Home Health Physical Therapy   Contact information:   [address] [place] [address] [phone]       Please follow up.   Contact information:   Continue to pursue assistance for Xifaxan through manufacturer      Follow up with Ria Bush, MD. Schedule an appointment as soon as possible for a visit in 1 week.   Specialty:  Family Medicine   Why:  Hospital follow up, Repeat electrolytes   Contact information:   [address] [place] [address]  [phone]      Total Time spent on discharge equals  45 minutes.  SignedOren Binet 11/06/2015 10:45 AM

## 2015-11-07 ENCOUNTER — Other Ambulatory Visit: Payer: Self-pay | Admitting: *Deleted

## 2015-11-07 ENCOUNTER — Telehealth: Payer: Self-pay

## 2015-11-07 DIAGNOSIS — J441 Chronic obstructive pulmonary disease with (acute) exacerbation: Secondary | ICD-10-CM | POA: Diagnosis not present

## 2015-11-07 DIAGNOSIS — N183 Chronic kidney disease, stage 3 (moderate): Secondary | ICD-10-CM | POA: Diagnosis not present

## 2015-11-07 DIAGNOSIS — K754 Autoimmune hepatitis: Secondary | ICD-10-CM | POA: Diagnosis not present

## 2015-11-07 DIAGNOSIS — S2242XD Multiple fractures of ribs, left side, subsequent encounter for fracture with routine healing: Secondary | ICD-10-CM | POA: Diagnosis not present

## 2015-11-07 DIAGNOSIS — I2510 Atherosclerotic heart disease of native coronary artery without angina pectoris: Secondary | ICD-10-CM | POA: Diagnosis not present

## 2015-11-07 DIAGNOSIS — M810 Age-related osteoporosis without current pathological fracture: Secondary | ICD-10-CM | POA: Diagnosis not present

## 2015-11-07 DIAGNOSIS — I5033 Acute on chronic diastolic (congestive) heart failure: Secondary | ICD-10-CM | POA: Diagnosis not present

## 2015-11-07 DIAGNOSIS — I130 Hypertensive heart and chronic kidney disease with heart failure and stage 1 through stage 4 chronic kidney disease, or unspecified chronic kidney disease: Secondary | ICD-10-CM | POA: Diagnosis not present

## 2015-11-07 DIAGNOSIS — M797 Fibromyalgia: Secondary | ICD-10-CM | POA: Diagnosis not present

## 2015-11-07 DIAGNOSIS — G2581 Restless legs syndrome: Secondary | ICD-10-CM | POA: Diagnosis not present

## 2015-11-07 DIAGNOSIS — E1122 Type 2 diabetes mellitus with diabetic chronic kidney disease: Secondary | ICD-10-CM | POA: Diagnosis not present

## 2015-11-07 DIAGNOSIS — S2220XD Unspecified fracture of sternum, subsequent encounter for fracture with routine healing: Secondary | ICD-10-CM | POA: Diagnosis not present

## 2015-11-07 DIAGNOSIS — R531 Weakness: Secondary | ICD-10-CM | POA: Diagnosis not present

## 2015-11-07 NOTE — Telephone Encounter (Signed)
Yes that is correct.  Thanks.

## 2015-11-07 NOTE — Telephone Encounter (Signed)
[name] with [facility] wants to know if Dr Darnell Level will be serve as attending physician for pallative care for pt. Pt recently discharged from [facility]; COPD, CHF and cirrhosis.[name] request cb.

## 2015-11-07 NOTE — Patient Outreach (Signed)
Transition of care call #1 completed. This is one of many hospitalizations for CHF and COPD. Pt was sent from outpt infusion area to ED. Her meds were changed this visit. She will be having a new palliative care NP but I will remain active with her as I know her so well and pt calls me when she has problems. I will see pt next week.  Deloria Lair [facility] [place] [phone]

## 2015-11-08 ENCOUNTER — Telehealth: Payer: Self-pay | Admitting: *Deleted

## 2015-11-08 NOTE — Telephone Encounter (Signed)
Message left advising Manus Gunning.

## 2015-11-08 NOTE — Telephone Encounter (Signed)
Joelene Millin, physical therapist with [facility], called to request verbal orders to extend home PT - 2x/week for 3 weeks for continued gait training and balance.  Please advise.

## 2015-11-08 NOTE — Telephone Encounter (Signed)
Ok to extend. Thanks.  ?

## 2015-11-08 NOTE — Telephone Encounter (Signed)
Transition Care Management Follow-up Telephone Call   Date discharged? 11/06/15   How have you been since you were released from the hospital? Doing well, improved today.   Do you understand why you were in the hospital? yes   Do you understand the discharge instructions? yes   Where were you discharged to? home   Items Reviewed:  Medications reviewed: yes  Allergies reviewed: yes  Dietary changes reviewed: no  Referrals reviewed: no   Functional Questionnaire:   Activities of Daily Living (ADLs):   She states they are independent in the following: ambulation, bathing and hygiene, feeding, continence, grooming, toileting and dressing States they require assistance with the following: none   Any transportation issues/concerns?: no   Any patient concerns? no   Confirmed importance and date/time of follow-up visits scheduled yes, 11/13/15 @ 1515  Provider Appointment booked with G. Renford Dills, MD (PCP out of the office)  Confirmed with patient if condition begins to worsen call PCP or go to the ER.  Patient was given the office number and encouraged to call back with question or concerns.  : yes

## 2015-11-09 DIAGNOSIS — N183 Chronic kidney disease, stage 3 (moderate): Secondary | ICD-10-CM | POA: Diagnosis not present

## 2015-11-09 DIAGNOSIS — I2510 Atherosclerotic heart disease of native coronary artery without angina pectoris: Secondary | ICD-10-CM | POA: Diagnosis not present

## 2015-11-09 DIAGNOSIS — I5033 Acute on chronic diastolic (congestive) heart failure: Secondary | ICD-10-CM | POA: Diagnosis not present

## 2015-11-09 DIAGNOSIS — K754 Autoimmune hepatitis: Secondary | ICD-10-CM | POA: Diagnosis not present

## 2015-11-09 DIAGNOSIS — S2242XD Multiple fractures of ribs, left side, subsequent encounter for fracture with routine healing: Secondary | ICD-10-CM | POA: Diagnosis not present

## 2015-11-09 DIAGNOSIS — J441 Chronic obstructive pulmonary disease with (acute) exacerbation: Secondary | ICD-10-CM | POA: Diagnosis not present

## 2015-11-09 DIAGNOSIS — E1122 Type 2 diabetes mellitus with diabetic chronic kidney disease: Secondary | ICD-10-CM | POA: Diagnosis not present

## 2015-11-09 DIAGNOSIS — I130 Hypertensive heart and chronic kidney disease with heart failure and stage 1 through stage 4 chronic kidney disease, or unspecified chronic kidney disease: Secondary | ICD-10-CM | POA: Diagnosis not present

## 2015-11-09 DIAGNOSIS — M797 Fibromyalgia: Secondary | ICD-10-CM | POA: Diagnosis not present

## 2015-11-09 DIAGNOSIS — M810 Age-related osteoporosis without current pathological fracture: Secondary | ICD-10-CM | POA: Diagnosis not present

## 2015-11-09 DIAGNOSIS — G2581 Restless legs syndrome: Secondary | ICD-10-CM | POA: Diagnosis not present

## 2015-11-09 DIAGNOSIS — S2220XD Unspecified fracture of sternum, subsequent encounter for fracture with routine healing: Secondary | ICD-10-CM | POA: Diagnosis not present

## 2015-11-09 NOTE — Telephone Encounter (Signed)
Message left notifying Joelene Millin.

## 2015-11-10 DIAGNOSIS — I2510 Atherosclerotic heart disease of native coronary artery without angina pectoris: Secondary | ICD-10-CM | POA: Diagnosis not present

## 2015-11-10 DIAGNOSIS — N183 Chronic kidney disease, stage 3 (moderate): Secondary | ICD-10-CM | POA: Diagnosis not present

## 2015-11-10 DIAGNOSIS — K754 Autoimmune hepatitis: Secondary | ICD-10-CM | POA: Diagnosis not present

## 2015-11-10 DIAGNOSIS — S2220XD Unspecified fracture of sternum, subsequent encounter for fracture with routine healing: Secondary | ICD-10-CM | POA: Diagnosis not present

## 2015-11-10 DIAGNOSIS — M810 Age-related osteoporosis without current pathological fracture: Secondary | ICD-10-CM | POA: Diagnosis not present

## 2015-11-10 DIAGNOSIS — I130 Hypertensive heart and chronic kidney disease with heart failure and stage 1 through stage 4 chronic kidney disease, or unspecified chronic kidney disease: Secondary | ICD-10-CM | POA: Diagnosis not present

## 2015-11-10 DIAGNOSIS — S2242XD Multiple fractures of ribs, left side, subsequent encounter for fracture with routine healing: Secondary | ICD-10-CM | POA: Diagnosis not present

## 2015-11-10 DIAGNOSIS — I5033 Acute on chronic diastolic (congestive) heart failure: Secondary | ICD-10-CM | POA: Diagnosis not present

## 2015-11-10 DIAGNOSIS — J441 Chronic obstructive pulmonary disease with (acute) exacerbation: Secondary | ICD-10-CM | POA: Diagnosis not present

## 2015-11-10 DIAGNOSIS — E1122 Type 2 diabetes mellitus with diabetic chronic kidney disease: Secondary | ICD-10-CM | POA: Diagnosis not present

## 2015-11-10 DIAGNOSIS — M797 Fibromyalgia: Secondary | ICD-10-CM | POA: Diagnosis not present

## 2015-11-10 DIAGNOSIS — G2581 Restless legs syndrome: Secondary | ICD-10-CM | POA: Diagnosis not present

## 2015-11-13 ENCOUNTER — Encounter: Payer: Self-pay | Admitting: Family Medicine

## 2015-11-13 ENCOUNTER — Ambulatory Visit (INDEPENDENT_AMBULATORY_CARE_PROVIDER_SITE_OTHER): Payer: Medicare Other | Admitting: Family Medicine

## 2015-11-13 VITALS — BP 142/68 | HR 77 | Temp 98.6°F | Wt 221.5 lb

## 2015-11-13 DIAGNOSIS — R0602 Shortness of breath: Secondary | ICD-10-CM

## 2015-11-13 DIAGNOSIS — I5032 Chronic diastolic (congestive) heart failure: Secondary | ICD-10-CM | POA: Diagnosis not present

## 2015-11-13 DIAGNOSIS — Z66 Do not resuscitate: Secondary | ICD-10-CM | POA: Diagnosis not present

## 2015-11-13 MED ORDER — OXYCODONE HCL 5 MG PO CAPS: 5.0000 mg | ORAL_CAPSULE | Freq: Four times a day (QID) | ORAL | Status: DC | PRN

## 2015-11-13 MED ORDER — ALPRAZOLAM 1 MG PO TABS: 0.5000 mg | ORAL_TABLET | Freq: Two times a day (BID) | ORAL | Status: DC | PRN

## 2015-11-13 NOTE — Progress Notes (Signed)
Pre visit review using our clinic review tool, if applicable. No additional management support is needed unless otherwise documented below in the visit note.  Admit Date: 11/01/2015 Discharge date: 11/06/2015  Recommendations for Outpatient Follow-up:  1. Case Management to arrange for palliative care follow up at home 2. Please repeat BMET at next visit.  PRIMARY DISCHARGE DIAGNOSIS: Principal Problem:  Acute on chronic respiratory failure (HCC) Active Problems:  HYPERCHOLESTEROLEMIA  Iron deficiency anemia due to chronic blood loss  Essential hypertension  Coronary atherosclerosis  COPD exacerbation (HCC)  GERD  Autoimmune hepatitis (HCC)  Obesity, Class II, BMI 35-39.9, with comorbidity (HCC)  GAVE (gastric antral vascular ectasia)  Other pancytopenia (HCC)  CKD stage 3 secondary to diabetes (HCC)  Hepatic cirrhosis (HCC)  Acute on chronic diastolic (congestive) heart failure (HCC)  Diabetes ([place])  Palliative care encounter  Acute on chronic diastolic congestive heart failure ([place])    PAST MEDICAL HISTORY: Past Medical History  Diagnosis Date  . Chronic airway obstruction, not elsewhere classified   . Unspecified chronic bronchitis ([place])   . Unspecified essential hypertension   . Non-obstructive CAD   . Palpitations   . Mild aortic stenosis     a. 03/2014 Valve area (VTI): 2.89 cm^2, Valve area (Vmax): 2.7 cm^2.  . Pure hypercholesterolemia   . Morbid obesity ([place])   . Esophageal reflux   . GAVE (gastric antral vascular ectasia)     angiodysplasia; s/p multiple APCs, discussing RFA with [facility] Dr Newman Pies (06/2015)  . Diverticulosis of colon (without mention of hemorrhage)   . Benign neoplasm of colon   . Osteoarthrosis, unspecified whether generalized or localized, unspecified site   . Osteoporosis, unspecified   . Restless legs syndrome (RLS)   . Major depressive disorder, recurrent  episode, severe, specified as with psychotic behavior   . Iron deficiency anemia secondary to blood loss (chronic)     a. frequent PRBC transfusions.  . Coarse tremors     a. arms.  . Carpal tunnel syndrome on both sides   . Chronic diastolic CHF (congestive heart failure) ([place])     a. 03/26/2014 Echo: EF 55-60%, no rwma, mild AS/AI, mod-sev Ca2+ MV annulus, mildly to mod dil LA.  [name] Asthma   . Type II diabetes mellitus ([place])   . H/O hiatal hernia   . 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 dx'd 1990's  . Adenomatous colon polyp   . Midsternal chest pain     a. conservatively managed ->poor candidate for cath/anticoagulation.  [name] Anxiety   . Portal hypertensive gastropathy   . Falls frequently     completed HHPT/OT 06/2014, PT unmet goals  . Recurrent UTI (urinary tract infection)     h/o hospitalization with urosepsis 2015, but thought large component colonization/bacteriuria, only treat if symptomatic ([place])  . Hiatal hernia   . Thrombocytopenia ([place])   . Protein calorie malnutrition ([place])   . On home oxygen therapy     "3L; 24/7" (11/02/2015)  . OSA (obstructive sleep apnea)     a. does not use CPAP. (01/25/2015)  . Hepatitis   . CAP (community acquired pneumonia) 2015    DISCHARGE MEDICATIONS: Current Discharge Medication List    START taking these medications   Details  potassium chloride SA (K-DUR,KLOR-CON) 20 MEQ tablet Take 2 tablets (40 mEq total) by mouth daily. Qty: 30 tablet, Refills: 0    predniSONE (DELTASONE) 10 MG tablet 2 tablets daily for 1 day, then,  1 tablet daily for 1 day and then stop Qty: 3 tablet, Refills: 0    pregabalin (LYRICA) 50 MG capsule Take 1 capsule (50 mg total) by mouth 2 (two) times daily. Qty: 60 capsule, Refills: 0    torsemide (DEMADEX) 20 MG tablet Take 2 tablets (40 mg total) by mouth  daily. Qty: 60 tablet, Refills: 0      CONTINUE these medications which have CHANGED   Details  oxycodone (OXY-IR) 5 MG capsule Take 1 capsule (5 mg total) by mouth every 6 (six) hours as needed for pain. Qty: 30 capsule, Refills: 0      CONTINUE these medications which have NOT CHANGED   Details  ADVAIR DISKUS 250-50 MCG/DOSE AEPB INHALE 1 PUFF INTO LUNGS TWICE A DAY Qty: 60 each, Refills: 11    albuterol (PROAIR HFA) 108 (90 BASE) MCG/ACT inhaler Inhale 2 puffs into the lungs every 6 (six) hours as needed for wheezing or shortness of breath. Reported on 08/18/2015    ALPRAZolam (XANAX) 1 MG tablet Take 1 tablet (1 mg total) by mouth 2 (two) times daily as needed for anxiety or sleep. Qty: 30 tablet, Refills: 0    B Complex-C ([employer] B COMPLEX PLUS C) TABS TAKE ONE CAPSULE BY MOUTH EVERY DAY Qty: 30 tablet, Refills: 5    buPROPion (WELLBUTRIN XL) 150 MG 24 hr tablet Take 150 mg by mouth daily.    desvenlafaxine (PRISTIQ) 50 MG 24 hr tablet Take 3 tablets (150 mg total) by mouth daily. Qty: 90 tablet, Refills: 11    ferrous sulfate 325 (65 FE) MG tablet Take 1 tablet (325 mg total) by mouth daily with breakfast. Qty: 90 tablet, Refills: 1    guaiFENesin (MUCINEX) 600 MG 12 hr tablet Take 600 mg by mouth 2 (two) times daily.     Insulin Glargine (LANTUS SOLOSTAR) 100 UNIT/ML Solostar Pen Inject 120 Units into the skin every morning. Qty: 20 pen, Refills: 11    insulin lispro (HUMALOG KWIKPEN) 100 UNIT/ML KiwkPen Inject 10 Units into the skin daily with supper.     lactulose, encephalopathy, (CHRONULAC) 10 GM/15ML SOLN Take 20 g by mouth every evening. I have instructed pt to begin taking 30 ml of this at least once a day and if she can tolerate a second dose without too many BMs to take a 2nd dose    levalbuterol (XOPENEX) 0.63 MG/3ML nebulizer solution Take 0.63 mg by nebulization every 8 (eight) hours as needed for wheezing or  shortness of breath.  Refills: 5    loratadine (CLARITIN) 10 MG tablet TAKE 1 TABLET BY MOUTH EVERY DAY Qty: 30 tablet, Refills: 11    Multiple Vitamins-Minerals (CENTRUM SILVER PO) Take 1 tablet by mouth daily.     mupirocin cream (BACTROBAN) 2 % Apply 1 application topically 2 (two) times daily.    pantoprazole (PROTONIX) 40 MG tablet TAKE 1 TABLET BY MOUTH TWICE A DAY Qty: 60 tablet, Refills: 11    promethazine (PHENERGAN) 25 MG tablet Take 25 mg by mouth 2 (two) times daily as needed for nausea or vomiting.    QUEtiapine (SEROQUEL) 200 MG tablet TAKE 1 TABLET BY MOUTH AT BEDTIME Qty: 30 tablet, Refills: 0    rOPINIRole (REQUIP) 4 MG tablet Take 4 mg by mouth at bedtime.    simvastatin (ZOCOR) 40 MG tablet Take 40 mg by mouth daily.    Vitamin D, Ergocalciferol, (DRISDOL) 50000 units CAPS capsule TAKE ONE CAPSULE WEEKLY Qty: 4 capsule, Refills: 6  carvedilol (COREG) 3.125 MG tablet TAKE AS DIRECTED - 1/2 TABLET DAILY Qty: 30 tablet, Refills: 3    glucose blood (ONE TOUCH ULTRA TEST) test strip Check blood sugar 4 times daily Qty: 200 each, Refills: 3    Insulin Pen Needle (B-D ULTRAFINE III SHORT PEN) 31G X 8 MM MISC Use as directed to inject insulin twice daily. Dx: E11.8 Qty: 200 each, Refills: 2    metolazone (ZAROXOLYN) 2.5 MG tablet Take as directed for 4 lb weight gain Qty: 30 tablet, Refills: 0   Associated Diagnoses: Chronic diastolic heart failure (HCC)    rifaximin (XIFAXAN) 550 MG TABS tablet Take 550 mg by mouth 2 (two) times daily. Reported on 10/17/2015      STOP taking these medications     furosemide (LASIX) 80 MG tablet         ALLERGIES:  Allergies  Allergen Reactions  . Acetaminophen Other (See Comments)    Liver dysfunction  . Nsaids Other (See Comments)    Liver dysfunction   . Aspirin Other (See Comments)    Causes nosebleeds  . Theophylline Nausea And  Vomiting  . Penicillin G Itching  . Penicillins Itching    Has patient had a PCN reaction causing immediate rash, facial/tongue/throat swelling, SOB or lightheadedness with hypotension: unknown Has patient had a PCN reaction causing severe rash involving mucus membranes or skin necrosis: unknown Has patient had a PCN reaction that required hospitalization unknown Has patient had a PCN reaction occurring within the last 10 years: yes If all of the above answers are "NO", then may proceed with Cephalosporin use.     BRIEF HPI: See H&P, Labs, Consult and Test reports for all details in brief, patient is a 75 y.o. female with PMH of diabetes mellitus, hyperlipidemia, COPD, asthma, GERD, depression, anxiety, diastolic congestive heart failure, liver cirrhosis, autoimmune hepatitis, OSA not on CPAP, GAVE (gastric antral vascular ectasia), anemia, chronic kidney disease-stage III, who presented with cough, shortness of breath, leg swelling.In ED Chest x-ray showed pulmonary vascular congestion and bronchitis change.  CONSULTATIONS:  Palliative care  PERTINENT RADIOLOGIC STUDIES:  Imaging Results    Dg Chest 2 View  11/01/2015 CLINICAL DATA: Shortness of Breath for 4 days, worsening. Cough. EXAM: CHEST 2 VIEW COMPARISON: 08/28/2015 FINDINGS: Cardiomegaly with vascular congestion. Linear subsegmental atelectasis or scarring at the right lung base. No confluent opacity on the left. No effusions. No acute bony abnormality. Mild peribronchial thickening. IMPRESSION: Cardiomegaly, vascular congestion and mild bronchitic changes. Right base atelectasis. Electronically Signed By: Rolm Baptise M.D. On: 11/01/2015 13:46      PERTINENT LAB RESULTS: CBC:  Recent Labs (last 2 labs)      Recent Labs  11/05/15 0553 11/06/15 0240  WBC 3.3* 3.8*  HGB 7.7* 8.3*  HCT 25.3* 27.8*  PLT 59* 66*     CMET CMP  Labs (Brief)       Component Value Date/Time    NA 142 11/06/2015 0240   NA 136 02/21/2015   K 4.0 11/06/2015 0240   K 4.0 02/21/2015   CL 102 11/06/2015 0240   CO2 31 11/06/2015 0240   GLUCOSE 161* 11/06/2015 0240   GLUCOSE 256* 09/04/2006 1603   BUN 33* 11/06/2015 0240   BUN 78* 02/21/2015   CREATININE 1.23* 11/06/2015 0240   CREATININE 1.07* 10/16/2015 1105   CALCIUM 9.2 11/06/2015 0240   PROT 5.6* 11/03/2015 0300   ALBUMIN 3.1* 11/03/2015 0300   AST 29 11/03/2015 0300   ALT 28 11/03/2015 0300  ALKPHOS 102 11/03/2015 0300   BILITOT 1.0 11/03/2015 0300   GFRNONAA 43* 11/06/2015 0240   GFRAA 50* 11/06/2015 0240      GFR Estimated Creatinine Clearance: 46.4 mL/min (by C-G formula based on Cr of 1.23).  Recent Labs (last 2 labs)     No results for input(s): LIPASE, AMYLASE in the last 72 hours.    Recent Labs (last 2 labs)     No results for input(s): CKTOTAL, CKMB, CKMBINDEX, TROPONINI in the last 72 hours.    Recent Labs (last 2 labs)     Invalid input(s): POCBNP    Recent Labs (last 2 labs)     No results for input(s): DDIMER in the last 72 hours.    Recent Labs (last 2 labs)     No results for input(s): HGBA1C in the last 72 hours.    Recent Labs (last 2 labs)     No results for input(s): CHOL, HDL, LDLCALC, TRIG, CHOLHDL, LDLDIRECT in the last 72 hours.    Recent Labs (last 2 labs)     No results for input(s): TSH, T4TOTAL, T3FREE, THYROIDAB in the last 72 hours.  Invalid input(s): FREET3    Recent Labs (last 2 labs)     No results for input(s): VITAMINB12, FOLATE, FERRITIN, TIBC, IRON, RETICCTPCT in the last 72 hours.   Coags:  Recent Labs (last 2 labs)     No results for input(s): INR in the last 72 hours.  Invalid input(s): PT   Microbiology: Recent Results (from the past 240 hour(s))  Culture, blood (Routine X 2) w Reflex to ID Panel Status: None (Preliminary result)   Collection Time: 11/01/15 10:11 PM   Result Value Ref Range Status   Specimen Description BLOOD RIGHT ANTECUBITAL  Final   Special Requests BOTTLES DRAWN AEROBIC AND ANAEROBIC 5CC EA  Final   Culture NO GROWTH 4 DAYS  Final   Report Status PENDING  Incomplete  Culture, blood (Routine X 2) w Reflex to ID Panel Status: None (Preliminary result)   Collection Time: 11/01/15 10:16 PM  Result Value Ref Range Status   Specimen Description BLOOD LEFT ANTECUBITAL  Final   Special Requests IN PEDIATRIC BOTTLE 4CC  Final   Culture NO GROWTH 4 DAYS  Final   Report Status PENDING  Incomplete  Culture, expectorated sputum-assessment Status: None   Collection Time: 11/02/15 11:07 AM  Result Value Ref Range Status   Specimen Description SPUTUM  Final   Special Requests NONE  Final   Sputum evaluation THIS SPECIMEN IS ACCEPTABLE FOR SPUTUM CULTURE  Final   Report Status 11/02/2015 FINAL  Final  Urine culture Status: None   Collection Time: 11/02/15 11:07 AM  Result Value Ref Range Status   Specimen Description URINE, CATHETERIZED  Final   Special Requests NONE  Final   Culture NO GROWTH 1 DAY  Final   Report Status 11/03/2015 FINAL  Final  Culture, respiratory (NON-Expectorated) Status: None   Collection Time: 11/02/15 11:07 AM  Result Value Ref Range Status   Specimen Description SPUTUM  Final   Special Requests NONE  Final   Gram Stain   Final    MODERATE WBC PRESENT, PREDOMINANTLY PMN FEW SQUAMOUS EPITHELIAL CELLS PRESENT MODERATE GRAM POSITIVE COCCI IN PAIRS IN CHAINS IN CLUSTERS THIS SPECIMEN IS ACCEPTABLE FOR SPUTUM CULTURE Performed at [facility]    Culture   Final    NORMAL OROPHARYNGEAL FLORA Performed at [facility]    Report Status 11/05/2015 FINAL  Final     BRIEF HOSPITAL COURSE:  Acute on chronic hypoxic respiratory failure:  Secondary to COPD exacerbation and acute diastolic heart failure.Suspect anemia also contributing to her shortness of breath. Much improved with nebs, diuretics. On 3 L of O2 at home.   Active Problems: Acute diastolic heart failure: Recent TTE on June 2017 showed EF around 55-60% with grade 2 diastolic dysfunction. Shortness of breath improved-close to baseline, lower extremity edema has resolved. Initially on IV Lasix- will transition to [facility] on discharge. Weight down to 221 pounds (230 pounds on admission), -12.4 L so far.Follow up with PCP for further close monitoring.   COPD exacerbation: Much better, lungs with good air entry with no rhonchi today. Continue prednisone-for 2 more days and then stop.Continue bronchodilators. On home O2 3 L/m  UTI: Complained of dysuria on admission, urine cultures negative, has completed 3 days of aztreonam. Afebrile and without leukocytosis. Has chronic leukopenia.  Chronic kidney disease stage II: Close to usual baseline-follow closely.  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 Suspect anemia playing some amount of role and exertional dyspnea. Has required prn transfusion in the past.Hb stable at 8.3 on discharge.  Hypertension: Controlled-only on diuretics  Type 2 diabetes: CBGs much better-as steroids have now been tapered down.Continue Lantus 120 units and usual regimen of NovoLog on discharge.  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 Follow with GI as scheduled.  Hx of GAVE and small bowel AVMs:likley contributing to anemia. No overt bleeding at present. Continue PPI  Peripheral neuropathy: Complains of pain in the lower extremities-suspect peripheral neuropathy secondary to diabetes. Started trial of lyrica while hospitalized-patient very reluctant to use Lyrica-given commercials she has seen about side effects. She would rather want to use prn oxycodone for  pain. Her main complaint over the last few days has been neuropathic pain in her lower extremities.  History of depression/anxiety: Stable, without any suicidal or homicidal ideations. Continue Seroquel, Pristiq, Wellbutrin  Dyslipidemia: Continue statin.  Obesity: Counseled regarding importance of weight loss  Palliative care: 75 year old unfortunate female with history of cirrhosis, COPD, chronic diastolic heart failure pancytopenia, chronic GI bleeding from GAVE/AVM's-multiple hospitalizations recently for similar issues-with shortness of breath which I believe is mostly multifactorial from COPD/CHF and anemia. Moreover, she is also has failure to thrive, chronic nausea and chronic lower ext neuropathic pain that is currently impairing her quality of life. Given multitude of chronic irreversible diseases-and slow decline-she poor overall prognoses. Palliative care was consulted during this hospital stay-recommendations are for outpatient palliative care follow up. Have spoken with case management-they will arrange.   TODAY-DAY OF DISCHARGE:  Subjective:   Charlesha Weeks today has no headache,no chest abdominal pain,no new weakness tingling or numbness.Her SOB and leg edema are much better. Continues to have issues with intermittent nausea/leg pain that are chronic.  Objective:   Blood pressure 139/51, pulse 61, temperature 98 F (36.7 C), temperature source Oral, resp. rate 18, weight 100.653 kg (221 lb 14.4 oz), SpO2 99 %.  Intake/Output Summary (Last 24 hours) at 11/06/15 1045 Last data filed at 11/06/15 0916  Gross per 24 hour  Intake  960 ml  Output  550 ml  Net  410 ml   Filed Weights   11/04/15 0551 11/05/15 0438 11/06/15 0500  Weight: 102.468 kg (225 lb 14.4 oz) 101.107 kg (222 lb 14.4 oz) 100.653 kg (221 lb 14.4 oz)    Exam Awake Alert, Oriented *3, No new F.N deficits, Normal  affect [place].AT,PERRAL Supple Neck,No JVD, No cervical  lymphadenopathy appriciated.  Symmetrical Chest wall movement, Good air movement bilaterally, CTAB RRR,No Gallops,Rubs or new Murmurs, No Parasternal Heave +ve B.Sounds, Abd Soft, Non tender, No organomegaly appriciated, No rebound -guarding or rigidity. No Cyanosis, Clubbing or edema, No new Rash or bruise  DISCHARGE CONDITION: Stable  DISPOSITION: Home with home health services  DISCHARGE INSTRUCTIONS:  Activity:  As tolerated with Full fall precautions use walker/cane & assistance as needed      Above d/w pt, esp overlapping SOB from CHF exacerbated by liver disease and CKD, COPD and anemia.  She was diuresed, changes to torsemide and her weight is down.  Much less BLE edema.  No ADE on meds.  Due for f/u labs.  She has some hip pain with walking, R hip pain, but this may be from relative deconditioning and time in bed as inpatient.  She has no injury.    She needed refill done on BZD and opiate, done at OV.  No ADE on meds. D/w pt.   She is living at home with family.  D/w pt about her goals of care.  Prev with palliative care involvement.  She wants to be comfortable and desires DNR/DNI.  D/w pt in detail.  She has a MOST form and she is working through that/considering options.  She is clear about DNR status.  D/w pt.    Currently still on O2, much less SOB than admission, minimal BLE edema.  No CP.  No fevers.  Prev UTI noted, but no dysuria.   PMH and SH reviewed  ROS: See HPI, otherwise noncontributory.  Meds, vitals, and allergies reviewed.   GEN: nad, alert and oriented, wheelchair bound, on O2 HEENT: mucous membranes moist NECK: supple w/o LA CV: rrr. Murmur noted PULM: ctab, no inc wob ABD: soft, +bs EXT: minimal (almost no) edema

## 2015-11-13 NOTE — Patient Instructions (Signed)
Post the yellow DNR sheet at home.  Go to the lab on the way out.  We'll contact you with your lab report. Don't change your meds for now.  Take care.  Glad to see you.

## 2015-11-14 ENCOUNTER — Encounter: Payer: Self-pay | Admitting: Family Medicine

## 2015-11-14 ENCOUNTER — Telehealth: Payer: Self-pay | Admitting: Cardiovascular Disease

## 2015-11-14 ENCOUNTER — Encounter: Payer: Self-pay | Admitting: *Deleted

## 2015-11-14 ENCOUNTER — Other Ambulatory Visit: Payer: Self-pay | Admitting: *Deleted

## 2015-11-14 DIAGNOSIS — S2220XD Unspecified fracture of sternum, subsequent encounter for fracture with routine healing: Secondary | ICD-10-CM | POA: Diagnosis not present

## 2015-11-14 DIAGNOSIS — J441 Chronic obstructive pulmonary disease with (acute) exacerbation: Secondary | ICD-10-CM | POA: Diagnosis not present

## 2015-11-14 DIAGNOSIS — G2581 Restless legs syndrome: Secondary | ICD-10-CM | POA: Diagnosis not present

## 2015-11-14 DIAGNOSIS — S2242XD Multiple fractures of ribs, left side, subsequent encounter for fracture with routine healing: Secondary | ICD-10-CM | POA: Diagnosis not present

## 2015-11-14 DIAGNOSIS — I130 Hypertensive heart and chronic kidney disease with heart failure and stage 1 through stage 4 chronic kidney disease, or unspecified chronic kidney disease: Secondary | ICD-10-CM | POA: Diagnosis not present

## 2015-11-14 DIAGNOSIS — M797 Fibromyalgia: Secondary | ICD-10-CM | POA: Diagnosis not present

## 2015-11-14 DIAGNOSIS — E1122 Type 2 diabetes mellitus with diabetic chronic kidney disease: Secondary | ICD-10-CM | POA: Diagnosis not present

## 2015-11-14 DIAGNOSIS — I2510 Atherosclerotic heart disease of native coronary artery without angina pectoris: Secondary | ICD-10-CM | POA: Diagnosis not present

## 2015-11-14 DIAGNOSIS — K754 Autoimmune hepatitis: Secondary | ICD-10-CM | POA: Diagnosis not present

## 2015-11-14 DIAGNOSIS — M810 Age-related osteoporosis without current pathological fracture: Secondary | ICD-10-CM | POA: Diagnosis not present

## 2015-11-14 DIAGNOSIS — N183 Chronic kidney disease, stage 3 (moderate): Secondary | ICD-10-CM | POA: Diagnosis not present

## 2015-11-14 DIAGNOSIS — I5033 Acute on chronic diastolic (congestive) heart failure: Secondary | ICD-10-CM | POA: Diagnosis not present

## 2015-11-14 LAB — COMPREHENSIVE METABOLIC PANEL
ALBUMIN: 3.3 g/dL — AB (ref 3.5–5.2)
ALT: 35 U/L (ref 0–35)
AST: 45 U/L — AB (ref 0–37)
Alkaline Phosphatase: 110 U/L (ref 39–117)
BILIRUBIN TOTAL: 1 mg/dL (ref 0.2–1.2)
BUN: 35 mg/dL — ABNORMAL HIGH (ref 6–23)
CALCIUM: 8.9 mg/dL (ref 8.4–10.5)
CO2: 31 mEq/L (ref 19–32)
CREATININE: 1.12 mg/dL (ref 0.40–1.20)
Chloride: 102 mEq/L (ref 96–112)
GFR: 50.78 mL/min — ABNORMAL LOW (ref >60.00)
Glucose, Bld: 338 mg/dL — ABNORMAL HIGH (ref 70–99)
Potassium: 4.1 mEq/L (ref 3.5–5.1)
Sodium: 139 mEq/L (ref 135–145)
TOTAL PROTEIN: 5.7 g/dL — AB (ref 6.0–8.3)

## 2015-11-14 LAB — CBC WITH DIFFERENTIAL/PLATELET
BASOS ABS: 0 10*3/uL (ref 0.0–0.1)
Basophils Relative: 0.1 % (ref 0.0–3.0)
EOS ABS: 0.1 10*3/uL (ref 0.0–0.7)
Eosinophils Relative: 2.9 % (ref 0.0–5.0)
HEMOGLOBIN: 8.9 g/dL — AB (ref 12.0–15.0)
Lymphs Abs: 0.2 10*3/uL — ABNORMAL LOW (ref 0.7–4.0)
MCHC: 33.6 g/dL (ref 30.0–36.0)
MCV: 94.7 fl (ref 78.0–100.0)
Monocytes Absolute: 0.3 10*3/uL (ref 0.1–1.0)
Monocytes Relative: 7.5 % (ref 3.0–12.0)
Neutro Abs: 3.4 10*3/uL (ref 1.4–7.7)
Neutrophils Relative %: 85.4 % — ABNORMAL HIGH (ref 43.0–77.0)
Platelets: 84 10*3/uL — ABNORMAL LOW (ref 150.0–400.0)
RBC: 2.8 Mil/uL — ABNORMAL LOW (ref 3.87–5.11)
RDW: 16.5 % — ABNORMAL HIGH (ref 11.5–15.5)
WBC: 4 10*3/uL (ref 4.0–10.5)

## 2015-11-14 LAB — BRAIN NATRIURETIC PEPTIDE: PRO B NATRI PEPTIDE: 82 pg/mL (ref 0.0–100.0)

## 2015-11-14 NOTE — Telephone Encounter (Signed)
Reviewed chart and the pt's carvedilol dosage was 3.125mg  one-half tablet by mouth twice a day and this is an ineffective dosage that was discontinued. The pt's BP was okay at 10/27/15 OV.  I left a message for [name] to contact the office.

## 2015-11-14 NOTE — Telephone Encounter (Signed)
Arbie Cookey is calling back to speak with you. She says feel free to leave the message on her voice mail if you have to.   Thanks

## 2015-11-14 NOTE — Telephone Encounter (Signed)
New message   [facility] calling   Pt C/O BP issue: STAT if pt C/O blurred vision, one-sided weakness or slurred speech  1. What are your last 5 BP readings? 172/90  Down  162/80 after 15 min today reading.    2. Are you having any other symptoms (ex. Dizziness, headache, blurred vision, passed out)? No   3. What is your BP issue? carvedilol was stop by Dr. Burt Knack on  3.3.2017

## 2015-11-14 NOTE — Patient Outreach (Signed)
[facility]) Care Management   11/14/2015  [name] 02/04/1941 [id number]  [name] is an 75 y.o. female  Subjective:   Objective:   Review of Systems  Constitutional: Negative.   HENT: Negative.   Respiratory: Negative.   Cardiovascular: Negative.        Murmurs. Trace edema L foot.  Gastrointestinal: Negative.   Genitourinary: Negative.   Musculoskeletal: Positive for myalgias.  Skin: Positive for rash.  Neurological: Negative.   Endo/Heme/Allergies: Bruises/bleeds easily.  Psychiatric/Behavioral: The patient is nervous/anxious.     Physical Exam  Constitutional: She is oriented to person, place, and time. She appears well-developed and well-nourished.  HENT:  Head: Normocephalic.  Neck: Normal range of motion. Neck supple.  Cardiovascular: Normal rate and regular rhythm.   Murmur heard. Respiratory: Effort normal and breath sounds normal.  GI: Soft. Bowel sounds are normal.  Musculoskeletal: She exhibits edema.  Neurological: She is alert and oriented to person, place, and time.  Skin: Skin is warm and dry. Rash noted.  Psychiatric: She has a normal mood and affect.    Current Medications:   Current Outpatient Prescriptions  Medication Sig Dispense Refill  . ADVAIR DISKUS 250-50 MCG/DOSE AEPB INHALE 1 PUFF INTO LUNGS TWICE A DAY 60 each 11  . albuterol (PROAIR HFA) 108 (90 BASE) MCG/ACT inhaler Inhale 2 puffs into the lungs every 6 (six) hours as needed for wheezing or shortness of breath. Reported on 08/18/2015    . ALPRAZolam (XANAX) 1 MG tablet Take 0.5-1 tablets (0.5-1 mg total) by mouth 2 (two) times daily as needed for anxiety or sleep. 60 tablet 0  . B Complex-C ([employer] B COMPLEX PLUS C) TABS TAKE ONE CAPSULE BY MOUTH EVERY DAY 30 tablet 5  . buPROPion (WELLBUTRIN XL) 150 MG 24 hr tablet Take 150 mg by mouth daily.    [name] desvenlafaxine (PRISTIQ) 50 MG 24 hr tablet Take 3 tablets (150 mg total) by mouth daily. 90 tablet 11  .  ferrous sulfate 325 (65 FE) MG tablet Take 1 tablet (325 mg total) by mouth daily with breakfast. 90 tablet 1  . glucose blood (ONE TOUCH ULTRA TEST) test strip Check blood sugar 4 times daily 200 each 3  . guaiFENesin (MUCINEX) 600 MG 12 hr tablet Take 600 mg by mouth 2 (two) times daily.     . Insulin Glargine (LANTUS SOLOSTAR) 100 UNIT/ML Solostar Pen Inject 120 Units into the skin every morning. 20 pen 11  . insulin lispro (HUMALOG KWIKPEN) 100 UNIT/ML KiwkPen Inject 10 Units into the skin daily with supper.     . Insulin Pen Needle (B-D ULTRAFINE III SHORT PEN) 31G X 8 MM MISC Use as directed to inject insulin twice daily. Dx: E11.8 200 each 2  . lactulose, encephalopathy, (CHRONULAC) 10 GM/15ML SOLN Take 20 g by mouth every evening. I have instructed pt to begin taking 30 ml of this at least once a day and if she can tolerate a second dose without too many BMs to take a 2nd dose    . levalbuterol (XOPENEX) 0.63 MG/3ML nebulizer solution Take 0.63 mg by nebulization every 8 (eight) hours as needed for wheezing or shortness of breath.   5  . loratadine (CLARITIN) 10 MG tablet TAKE 1 TABLET BY MOUTH EVERY DAY 30 tablet 11  . Multiple Vitamins-Minerals (CENTRUM SILVER PO) Take 1 tablet by mouth daily.     [name] oxycodone (OXY-IR) 5 MG capsule Take 1 capsule (5 mg total) by mouth  every 6 (six) hours as needed for pain. 60 capsule 0  . pantoprazole (PROTONIX) 40 MG tablet TAKE 1 TABLET BY MOUTH TWICE A DAY 60 tablet 11  . potassium chloride SA (K-DUR,KLOR-CON) 20 MEQ tablet Take 2 tablets (40 mEq total) by mouth daily. 30 tablet 0  . promethazine (PHENERGAN) 25 MG tablet Take 25 mg by mouth 2 (two) times daily as needed for nausea or vomiting.    [name] QUEtiapine (SEROQUEL) 200 MG tablet TAKE 1 TABLET BY MOUTH AT BEDTIME 30 tablet 0  . rOPINIRole (REQUIP) 4 MG tablet Take 4 mg by mouth at bedtime.    . simvastatin (ZOCOR) 40 MG tablet Take 40 mg by mouth daily.    [name] torsemide (DEMADEX) 20 MG tablet Take 2  tablets (40 mg total) by mouth daily. 60 tablet 0  . Vitamin D, Ergocalciferol, (DRISDOL) 50000 units CAPS capsule TAKE ONE CAPSULE WEEKLY (Patient taking differently: TAKE ONE CAPSULE WEEKLY ON FRIDAY) 4 capsule 6  . metolazone (ZAROXOLYN) 2.5 MG tablet Take as directed for 4 lb weight gain (Patient not taking: Reported on 11/14/2015) 30 tablet 0  . mupirocin cream (BACTROBAN) 2 % Apply 1 application topically 2 (two) times daily. Reported on 11/14/2015    . rifaximin (XIFAXAN) 550 MG TABS tablet Take 550 mg by mouth 2 (two) times daily. Reported on 10/17/2015     No current facility-administered medications for this visit.    Functional Status:   In your present state of health, do you have any difficulty performing the following activities: 11/02/2015 10/18/2015  Hearing? N N  Vision? Y N  Difficulty concentrating or making decisions? N N  Walking or climbing stairs? Y Y  Dressing or bathing? N N  Doing errands, shopping? Y -  Conservation officer, nature and eating ? - -  Using the Toilet? - -  In the past six months, have you accidently leaked urine? - -  Do you have problems with loss of bowel control? - -  Managing your Medications? - -  Managing your Finances? - -  Housekeeping or managing your Housekeeping? - -    Fall/Depression Screening:    PHQ 2/9 Scores 09/07/2015 09/04/2015 07/28/2015 04/03/2015 01/05/2015 12/19/2014 12/19/2014  PHQ - 2 Score 4 4 4 1 2 2 2   PHQ- 9 Score 16 15 15  - - - 10   A:          HTN                              CHF stable                         COPD stable  Plan:  Called Dr. Antionette Char office to report HTN after discontinuation of cavedilol.            Reviewed CHF, COPD self maintenance.            Reinforced need to call for wt gain of 4# or wt of 226, or any signs of exacerbation.            I will call in one week.            I will see pt in 2 weeks.  Deloria Lair GNP-BC [place] [phone]   Addendum: Ander Purpura from Dr. Antionette Char office called me  back and asked if the pt could monitor her blood pressure at home for the  next week and then report back with values to decide if an antihypertensive is needed. I called the pt and requested this she said she could do this. I asked her to take her pressure at different times of the day and record the time and the reading. I will call her next week.  Deloria Lair [facility] [phone]

## 2015-11-14 NOTE — Telephone Encounter (Signed)
I spoke with [name] and she noted a higher BP today during home visit.  I advised her that her BP at office visit was okay and that BP did not seem to be an issue during hospitalization. BP at PCP visit yesterday was 142/68.  I advised that the pt can monitor her BP at home and then contact our office with readings and we can adjust medications based on readings. [name] said the pt does have a BP cuff at home and she will contact the pt with instructions.

## 2015-11-15 ENCOUNTER — Encounter (HOSPITAL_COMMUNITY)
Admission: RE | Admit: 2015-11-15 | Discharge: 2015-11-15 | Disposition: A | Discharge status: Home / Self Care | Payer: Medicare Other | Source: Ambulatory Visit | Attending: Family Medicine | Admitting: Family Medicine

## 2015-11-15 DIAGNOSIS — K7460 Unspecified cirrhosis of liver: Secondary | ICD-10-CM | POA: Diagnosis not present

## 2015-11-15 DIAGNOSIS — N289 Disorder of kidney and ureter, unspecified: Secondary | ICD-10-CM | POA: Diagnosis not present

## 2015-11-15 DIAGNOSIS — Q2733 Arteriovenous malformation of digestive system vessel: Secondary | ICD-10-CM | POA: Diagnosis not present

## 2015-11-15 DIAGNOSIS — D649 Anemia, unspecified: Secondary | ICD-10-CM | POA: Diagnosis not present

## 2015-11-15 MED ORDER — DARBEPOETIN ALFA 200 MCG/0.4ML IJ SOSY
200.0000 ug | PREFILLED_SYRINGE | INTRAMUSCULAR | Status: DC
  Administered 2015-11-15: 200 ug via SUBCUTANEOUS

## 2015-11-15 MED ORDER — DARBEPOETIN ALFA 200 MCG/0.4ML IJ SOSY
PREFILLED_SYRINGE | INTRAMUSCULAR | Status: AC
  Administered 2015-11-15: 200 ug via SUBCUTANEOUS
  Filled 2015-11-15: qty 0.4

## 2015-11-15 NOTE — Assessment & Plan Note (Signed)
She is living at home with family. D/w pt about her goals of care. Prev with palliative care involvement. She wants to be comfortable and desires DNR/DNI. D/w pt in detail. She has a MOST form and she is working through that/considering options. She is clear about DNR status. D/w pt.

## 2015-11-15 NOTE — Assessment & Plan Note (Signed)
With overlapping SOB prev from CHF exacerbated by liver disease and CKD, COPD and anemia. She was diuresed, changes to torsemide and her weight is down. Much less BLE edema. No ADE on meds. Due for f/u labs- see notes on labs.  No changes in meds at the OV.  She agrees.

## 2015-11-16 ENCOUNTER — Other Ambulatory Visit: Payer: Self-pay | Admitting: Family Medicine

## 2015-11-16 LAB — POCT HEMOGLOBIN-HEMACUE: Hemoglobin: 8.9 g/dL — ABNORMAL LOW (ref 12.0–15.0)

## 2015-11-17 ENCOUNTER — Telehealth: Payer: Self-pay | Admitting: *Deleted

## 2015-11-17 DIAGNOSIS — I5033 Acute on chronic diastolic (congestive) heart failure: Secondary | ICD-10-CM | POA: Diagnosis not present

## 2015-11-17 DIAGNOSIS — I2510 Atherosclerotic heart disease of native coronary artery without angina pectoris: Secondary | ICD-10-CM | POA: Diagnosis not present

## 2015-11-17 DIAGNOSIS — G2581 Restless legs syndrome: Secondary | ICD-10-CM | POA: Diagnosis not present

## 2015-11-17 DIAGNOSIS — E1122 Type 2 diabetes mellitus with diabetic chronic kidney disease: Secondary | ICD-10-CM | POA: Diagnosis not present

## 2015-11-17 DIAGNOSIS — M810 Age-related osteoporosis without current pathological fracture: Secondary | ICD-10-CM | POA: Diagnosis not present

## 2015-11-17 DIAGNOSIS — S2242XD Multiple fractures of ribs, left side, subsequent encounter for fracture with routine healing: Secondary | ICD-10-CM | POA: Diagnosis not present

## 2015-11-17 DIAGNOSIS — J441 Chronic obstructive pulmonary disease with (acute) exacerbation: Secondary | ICD-10-CM | POA: Diagnosis not present

## 2015-11-17 DIAGNOSIS — M797 Fibromyalgia: Secondary | ICD-10-CM | POA: Diagnosis not present

## 2015-11-17 DIAGNOSIS — N183 Chronic kidney disease, stage 3 (moderate): Secondary | ICD-10-CM | POA: Diagnosis not present

## 2015-11-17 DIAGNOSIS — K754 Autoimmune hepatitis: Secondary | ICD-10-CM | POA: Diagnosis not present

## 2015-11-17 DIAGNOSIS — S2220XD Unspecified fracture of sternum, subsequent encounter for fracture with routine healing: Secondary | ICD-10-CM | POA: Diagnosis not present

## 2015-11-17 DIAGNOSIS — I130 Hypertensive heart and chronic kidney disease with heart failure and stage 1 through stage 4 chronic kidney disease, or unspecified chronic kidney disease: Secondary | ICD-10-CM | POA: Diagnosis not present

## 2015-11-18 NOTE — Telephone Encounter (Signed)
agree

## 2015-11-20 ENCOUNTER — Other Ambulatory Visit: Payer: Self-pay | Admitting: Family Medicine

## 2015-11-21 ENCOUNTER — Ambulatory Visit (INDEPENDENT_AMBULATORY_CARE_PROVIDER_SITE_OTHER): Payer: Medicare Other | Admitting: Family Medicine

## 2015-11-21 ENCOUNTER — Encounter: Payer: Self-pay | Admitting: Family Medicine

## 2015-11-21 ENCOUNTER — Other Ambulatory Visit: Payer: Self-pay | Admitting: *Deleted

## 2015-11-21 VITALS — BP 140/80 | HR 80 | Temp 98.0°F | Wt 224.2 lb

## 2015-11-21 DIAGNOSIS — K7469 Other cirrhosis of liver: Secondary | ICD-10-CM | POA: Diagnosis not present

## 2015-11-21 DIAGNOSIS — N183 Chronic kidney disease, stage 3 unspecified: Secondary | ICD-10-CM

## 2015-11-21 DIAGNOSIS — E1122 Type 2 diabetes mellitus with diabetic chronic kidney disease: Secondary | ICD-10-CM | POA: Diagnosis not present

## 2015-11-21 DIAGNOSIS — K754 Autoimmune hepatitis: Secondary | ICD-10-CM

## 2015-11-21 DIAGNOSIS — R110 Nausea: Secondary | ICD-10-CM | POA: Diagnosis not present

## 2015-11-21 DIAGNOSIS — I5032 Chronic diastolic (congestive) heart failure: Secondary | ICD-10-CM | POA: Diagnosis not present

## 2015-11-21 DIAGNOSIS — M545 Low back pain, unspecified: Secondary | ICD-10-CM | POA: Insufficient documentation

## 2015-11-21 LAB — RENAL FUNCTION PANEL
ALBUMIN: 3.3 g/dL — AB (ref 3.5–5.2)
BUN: 37 mg/dL — ABNORMAL HIGH (ref 6–23)
CALCIUM: 8.9 mg/dL (ref 8.4–10.5)
CO2: 30 meq/L (ref 19–32)
CREATININE: 1.3 mg/dL — AB (ref 0.40–1.20)
Chloride: 98 mEq/L (ref 96–112)
GFR: 42.75 mL/min — ABNORMAL LOW (ref >60.00)
GLUCOSE: 413 mg/dL — AB (ref 70–99)
POTASSIUM: 3.5 meq/L (ref 3.5–5.1)
Phosphorus: 4.6 mg/dL (ref 2.3–4.6)
Sodium: 136 mEq/L (ref 135–145)

## 2015-11-21 LAB — POC URINALSYSI DIPSTICK (AUTOMATED)
BILIRUBIN UA: NEGATIVE
Blood, UA: NEGATIVE
Glucose, UA: NEGATIVE
KETONES UA: NEGATIVE
Leukocytes, UA: NEGATIVE
Nitrite, UA: NEGATIVE
PH UA: 6
PROTEIN UA: NEGATIVE
SPEC GRAV UA: 1.015
Urobilinogen, UA: 0.2

## 2015-11-21 LAB — BRAIN NATRIURETIC PEPTIDE: Pro B Natriuretic peptide (BNP): 72 pg/mL (ref 0.0–100.0)

## 2015-11-21 LAB — AMMONIA: Ammonia: 83 umol/L — ABNORMAL HIGH (ref 16–53)

## 2015-11-21 NOTE — Assessment & Plan Note (Signed)
Update Cr today.  

## 2015-11-21 NOTE — Addendum Note (Signed)
Addended by: Ellamae Sia on: 11/21/2015 02:06 PM   Modules accepted: Orders

## 2015-11-21 NOTE — Patient Outreach (Signed)
Transition of care call #3 - Pt is not feeling well. She says she has been sick for 2 days, complaining of nausea without emesis. She has taken phenergan several times. She reports feeling weak and "like my kidneys are going to fail." I tried to assure her that kidney failure doesn't hurt and she is going to labs done today at Dr. Danise Mina office to check on her kidney function. I reinforced that her medical team is being very vigilent since she is on new meds (torsemide). I encouraged her to discuss her fears with the provider today.  Pt has decided to participate in [facility] (a palliative care program). I will be making my last home visit to her next week. Jeanne Ivan, NP, for [facility] will be taking over for home visits.  Deloria Lair [facility] [place] [phone]

## 2015-11-21 NOTE — Assessment & Plan Note (Signed)
UA negative for cystitis. Check labs today to further eval for uremia or other cause of nausea.

## 2015-11-21 NOTE — Assessment & Plan Note (Addendum)
Check BNP today. Lasix changed to torsemide + metolazone PRN weight gain >4 lbs.

## 2015-11-21 NOTE — Assessment & Plan Note (Signed)
UA WNL. Anticipate arthritis related pain.

## 2015-11-21 NOTE — Assessment & Plan Note (Signed)
She endorses some constipation - hard firm stools 1-2x/day. Currently taking lactulose 84mL BID. Will increase lactulose to 65mL TID.

## 2015-11-21 NOTE — Progress Notes (Signed)
Pre visit review using our clinic review tool, if applicable. No additional management support is needed unless otherwise documented below in the visit note. 

## 2015-11-21 NOTE — Progress Notes (Signed)
BP 140/80 mmHg  Pulse 80  Temp(Src) 98 F (36.7 C) (Oral)  Wt 224 lb 4 oz (101.719 kg)  SpO2 96% on 3L [place]  CC: 1 mo f/u visit, not feeling well  Subjective:    Patient ID: [name], female    DOB: 1941/02/22, 75 y.o.   MRN: [id number]  HPI: [name] is a 75 y.o. female presenting on 11/21/2015 for Follow-up and Urinary Tract Infection   Recent hosp f/u visit with Dr Damita Dunnings - record reviewed. Hospitalized from 3/8-13/2017 for COPD exacerbation along with acute diastolic CHF. Treated with nebs/diuretics, and aztreonam. UCx at that time negative. Discharge weight 221lbs. 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 Lasix was transitioned to torsemide  Not feeling well for last 2 days. Endorses lower back pain, low grade temperature, general malaise, nausea. Treating with phenergan. This morning noticed worsened pedal edema, did not have restful night last night (sat on side of bed for several hours due to nausea). Denies abd pain, vomiting, dysuria hematuria or other UTI sxs.  She is on torsemide 40mg  daily. She did take a metolazone on Sunday. Noticing small firm hard stools.   DNR/DNI. Lives at home, husband cares for her. Working on MOST form. Participating in [facility] (palliative care home program).   Latest Hgb 8.9.   Relevant past medical, surgical, family and social history reviewed and updated as indicated. Interim medical history since our last visit reviewed. Allergies and medications reviewed and updated. Current Outpatient Prescriptions on File Prior to Visit  Medication Sig  . ADVAIR DISKUS 250-50 MCG/DOSE AEPB INHALE 1 PUFF INTO LUNGS TWICE A DAY  . albuterol (PROAIR HFA) 108 (90 BASE) MCG/ACT inhaler Inhale 2 puffs into the lungs every 6 (six) hours as needed for wheezing or shortness of breath. Reported on 08/18/2015  . ALPRAZolam (XANAX) 1 MG tablet Take 0.5-1 tablets (0.5-1 mg total) by mouth 2  (two) times daily as needed for anxiety or sleep.  . B Complex-C ([employer]S B COMPLEX PLUS C) TABS TAKE ONE CAPSULE BY MOUTH EVERY DAY  . buPROPion (WELLBUTRIN XL) 150 MG 24 hr tablet Take 150 mg by mouth daily.  [name] desvenlafaxine (PRISTIQ) 50 MG 24 hr tablet Take 3 tablets (150 mg total) by mouth daily.  . ferrous sulfate 325 (65 FE) MG tablet Take 1 tablet (325 mg total) by mouth daily with breakfast.  . glucose blood (ONE TOUCH ULTRA TEST) test strip Check blood sugar 4 times daily  . guaiFENesin (MUCINEX) 600 MG 12 hr tablet Take 600 mg by mouth 2 (two) times daily.   . Insulin Glargine (LANTUS SOLOSTAR) 100 UNIT/ML Solostar Pen Inject 120 Units into the skin every morning.  . insulin lispro (HUMALOG KWIKPEN) 100 UNIT/ML KiwkPen Inject 10 Units into the skin daily with supper.   . Insulin Pen Needle (B-D ULTRAFINE III SHORT PEN) 31G X 8 MM MISC Use as directed to inject insulin twice daily. Dx: E11.8  . KLOR-CON M20 20 MEQ tablet TAKE 2 TABLETS EVERY DAY  . levalbuterol (XOPENEX) 0.63 MG/3ML nebulizer solution Take 0.63 mg by nebulization every 8 (eight) hours as needed for wheezing or shortness of breath.   . loratadine (CLARITIN) 10 MG tablet TAKE 1 TABLET BY MOUTH EVERY DAY  . metolazone (ZAROXOLYN) 2.5 MG tablet Take as directed for 4 lb weight gain  . Multiple Vitamins-Minerals (CENTRUM SILVER PO) Take 1 tablet by mouth daily.   . mupirocin cream (  BACTROBAN) 2 % Apply 1 application topically 2 (two) times daily. Reported on 11/14/2015  . oxycodone (OXY-IR) 5 MG capsule Take 1 capsule (5 mg total) by mouth every 6 (six) hours as needed for pain.  . pantoprazole (PROTONIX) 40 MG tablet TAKE 1 TABLET BY MOUTH TWICE A DAY  . promethazine (PHENERGAN) 25 MG tablet Take 25 mg by mouth 2 (two) times daily as needed for nausea or vomiting.  [name] QUEtiapine (SEROQUEL) 200 MG tablet TAKE 1 TABLET BY MOUTH AT BEDTIME  . rifaximin (XIFAXAN) 550 MG TABS tablet Take 550 mg by mouth 2 (two) times daily. Reported  on 10/17/2015  . rOPINIRole (REQUIP) 4 MG tablet Take 4 mg by mouth at bedtime.  . simvastatin (ZOCOR) 40 MG tablet Take 40 mg by mouth daily.  [name] torsemide (DEMADEX) 20 MG tablet Take 2 tablets (40 mg total) by mouth daily.  . Vitamin D, Ergocalciferol, (DRISDOL) 50000 units CAPS capsule TAKE ONE CAPSULE WEEKLY (Patient taking differently: TAKE ONE CAPSULE WEEKLY ON FRIDAY)   No current facility-administered medications on file prior to visit.    Review of Systems Per HPI unless specifically indicated in ROS section     Objective:    BP 140/80 mmHg  Pulse 80  Temp(Src) 98 F (36.7 C) (Oral)  Wt 224 lb 4 oz (101.719 kg)  SpO2 96%  Wt Readings from Last 3 Encounters:  11/21/15 224 lb 4 oz (101.719 kg)  11/14/15 219 lb (99.338 kg)  11/13/15 221 lb 8 oz (100.472 kg)    Physical Exam  Constitutional: She appears well-developed and well-nourished. No distress.  HENT:  Mouth/Throat: No oropharyngeal exudate.  Dry MM  Cardiovascular: Normal rate, regular rhythm, normal heart sounds and intact distal pulses.   No murmur heard. Pulmonary/Chest: Effort normal and breath sounds normal. No respiratory distress. She has no wheezes. She has no rales.  Musculoskeletal: She exhibits edema (1+ pitting edema).  Nursing note and vitals reviewed.  Results for orders placed or performed in visit on 11/21/15  POCT Urinalysis Dipstick (Automated)  Result Value Ref Range   Color, UA Yellow    Clarity, UA Clear    Glucose, UA Negative    Bilirubin, UA Negative    Ketones, UA Negative    Spec Grav, UA 1.015    Blood, UA Negative    pH, UA 6.0    Protein, UA Negative    Urobilinogen, UA 0.2    Nitrite, UA Negative    Leukocytes, UA Negative Negative   *Note: Due to a large number of results and/or encounters for the requested time period, some results have not been displayed. A complete set of results can be found in Results Review.      Assessment & Plan:   Problem List Items Addressed  This Visit    Right-sided low back pain without sciatica    UA WNL. Anticipate arthritis related pain.       Nausea without vomiting - Primary    UA negative for cystitis. Check labs today to further eval for uremia or other cause of nausea.       Relevant Orders   POCT Urinalysis Dipstick (Automated) (Completed)   Hepatic cirrhosis ([place])    She endorses some constipation - hard firm stools 1-2x/day. Currently taking lactulose 95mL BID. Will increase lactulose to 21mL TID.      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  Relevant Orders   POCT Urinalysis Dipstick (Automated) (Completed)   Renal function panel   Chronic diastolic heart failure (H[facility])    Check BNP today. Lasix changed to torsemide + metolazone PRN weight gain >4 lbs.      Relevant Orders   Brain natriuretic peptide   Autoimmune hepatitis ([place]) (Chronic)   Relevant Orders   Ammonia       Follow up plan: Return in about 4 weeks (around 12/19/2015), or as needed, for follow up visit.

## 2015-11-21 NOTE — Patient Instructions (Addendum)
Blood work today. That will help determine torsemide dosing. Urine looking ok today.

## 2015-11-22 DIAGNOSIS — G2581 Restless legs syndrome: Secondary | ICD-10-CM | POA: Diagnosis not present

## 2015-11-22 DIAGNOSIS — S2220XD Unspecified fracture of sternum, subsequent encounter for fracture with routine healing: Secondary | ICD-10-CM | POA: Diagnosis not present

## 2015-11-22 DIAGNOSIS — N183 Chronic kidney disease, stage 3 (moderate): Secondary | ICD-10-CM | POA: Diagnosis not present

## 2015-11-22 DIAGNOSIS — E1122 Type 2 diabetes mellitus with diabetic chronic kidney disease: Secondary | ICD-10-CM | POA: Diagnosis not present

## 2015-11-22 DIAGNOSIS — J441 Chronic obstructive pulmonary disease with (acute) exacerbation: Secondary | ICD-10-CM | POA: Diagnosis not present

## 2015-11-22 DIAGNOSIS — I130 Hypertensive heart and chronic kidney disease with heart failure and stage 1 through stage 4 chronic kidney disease, or unspecified chronic kidney disease: Secondary | ICD-10-CM | POA: Diagnosis not present

## 2015-11-22 DIAGNOSIS — M810 Age-related osteoporosis without current pathological fracture: Secondary | ICD-10-CM | POA: Diagnosis not present

## 2015-11-22 DIAGNOSIS — K754 Autoimmune hepatitis: Secondary | ICD-10-CM | POA: Diagnosis not present

## 2015-11-22 DIAGNOSIS — I5033 Acute on chronic diastolic (congestive) heart failure: Secondary | ICD-10-CM | POA: Diagnosis not present

## 2015-11-22 DIAGNOSIS — I2510 Atherosclerotic heart disease of native coronary artery without angina pectoris: Secondary | ICD-10-CM | POA: Diagnosis not present

## 2015-11-22 DIAGNOSIS — M797 Fibromyalgia: Secondary | ICD-10-CM | POA: Diagnosis not present

## 2015-11-22 DIAGNOSIS — S2242XD Multiple fractures of ribs, left side, subsequent encounter for fracture with routine healing: Secondary | ICD-10-CM | POA: Diagnosis not present

## 2015-11-23 ENCOUNTER — Ambulatory Visit: Payer: Medicare Other | Admitting: *Deleted

## 2015-11-23 ENCOUNTER — Telehealth: Payer: Self-pay | Admitting: Family Medicine

## 2015-11-23 MED ORDER — FLUCONAZOLE 150 MG PO TABS: 150.0000 mg | ORAL_TABLET | Freq: Once | ORAL | Status: DC

## 2015-11-23 NOTE — Telephone Encounter (Signed)
tient Name: [name]  DOB: 12-Jul-1941    Initial Comment Caller states she thinks she has a yeast infection in mouth. States she sees MD Sheria Lang at [facility].   Nurse Assessment  Nurse: Raphael Gibney, RN, Vanita Ingles Date/Time (Eastern Time): 11/23/2015 2:53:43 PM  Confirm and document reason for call. If symptomatic, describe symptoms. You must click the next button to save text entered. ---Caller states she has white patches in her mouth. She takes Diflucan for the yeast in her mouth. Has pain. She is able to drink fluids. Symptoms started on Tuesday. She has used Listerine but it is worse  Has the patient traveled out of the country within the last 30 days? ---No  Does the patient have any new or worsening symptoms? ---Yes  Will a triage be completed? ---Yes  Related visit to physician within the last 2 weeks? ---No  Does the PT have any chronic conditions? (i.e. diabetes, asthma, etc.) ---Yes  List chronic conditions. ---CHF; COPD; liver disease, pancreas disease; restless leg syndrome, diabetes  Is this a behavioral health or substance abuse call? ---No     Guidelines    Guideline Title Affirmed Question Affirmed Notes  Mouth Pain Diabetes mellitus or weak immune system (e.g., HIV positive, transplant patient)    Final Disposition User   See Physician within 73 Hours Stringer, RN, Vera    Comments  Pt states she can not come into the office for appt as she is homebound. She would like prescription for Diflucan called into the pharmacy as it usually helps the mouth symptoms. Please call pt back and let her know if the Diflucan will be called in.   Referrals  GO TO FACILITY REFUSED   Disagree/Comply: Disagree  Disagree/Comply Reason: Disagree with instructions

## 2015-11-23 NOTE — Telephone Encounter (Signed)
Transmission to pharmacy failed. plz phone in.

## 2015-11-23 NOTE — Telephone Encounter (Signed)
Okay to send prescription for Diflucan?

## 2015-11-23 NOTE — Telephone Encounter (Addendum)
Diflucan sent in. May repeat in 4 days if needed Hold simvastatin for 2 days after taking med.

## 2015-11-23 NOTE — Addendum Note (Signed)
Addended by: Ria Bush on: 11/23/2015 08:05 PM   Modules accepted: Orders

## 2015-11-24 ENCOUNTER — Other Ambulatory Visit: Payer: Self-pay | Admitting: Family Medicine

## 2015-11-24 DIAGNOSIS — S2242XD Multiple fractures of ribs, left side, subsequent encounter for fracture with routine healing: Secondary | ICD-10-CM | POA: Diagnosis not present

## 2015-11-24 DIAGNOSIS — K754 Autoimmune hepatitis: Secondary | ICD-10-CM | POA: Diagnosis not present

## 2015-11-24 DIAGNOSIS — J441 Chronic obstructive pulmonary disease with (acute) exacerbation: Secondary | ICD-10-CM | POA: Diagnosis not present

## 2015-11-24 DIAGNOSIS — I5033 Acute on chronic diastolic (congestive) heart failure: Secondary | ICD-10-CM | POA: Diagnosis not present

## 2015-11-24 DIAGNOSIS — I130 Hypertensive heart and chronic kidney disease with heart failure and stage 1 through stage 4 chronic kidney disease, or unspecified chronic kidney disease: Secondary | ICD-10-CM | POA: Diagnosis not present

## 2015-11-24 DIAGNOSIS — S2220XD Unspecified fracture of sternum, subsequent encounter for fracture with routine healing: Secondary | ICD-10-CM | POA: Diagnosis not present

## 2015-11-24 DIAGNOSIS — M810 Age-related osteoporosis without current pathological fracture: Secondary | ICD-10-CM | POA: Diagnosis not present

## 2015-11-24 DIAGNOSIS — I2510 Atherosclerotic heart disease of native coronary artery without angina pectoris: Secondary | ICD-10-CM | POA: Diagnosis not present

## 2015-11-24 DIAGNOSIS — N183 Chronic kidney disease, stage 3 (moderate): Secondary | ICD-10-CM | POA: Diagnosis not present

## 2015-11-24 DIAGNOSIS — M797 Fibromyalgia: Secondary | ICD-10-CM | POA: Diagnosis not present

## 2015-11-24 DIAGNOSIS — G2581 Restless legs syndrome: Secondary | ICD-10-CM | POA: Diagnosis not present

## 2015-11-24 DIAGNOSIS — E1122 Type 2 diabetes mellitus with diabetic chronic kidney disease: Secondary | ICD-10-CM | POA: Diagnosis not present

## 2015-11-24 NOTE — Telephone Encounter (Signed)
Rx called in as directed and patient notified.  

## 2015-11-24 NOTE — Telephone Encounter (Signed)
Ok to refill 

## 2015-11-27 ENCOUNTER — Telehealth: Payer: Self-pay | Admitting: *Deleted

## 2015-11-27 NOTE — Telephone Encounter (Signed)
-----   Message from Hulan Saas, RN sent at 05/29/2015  9:11 AM EDT ----- Needs MRCP repeated for SA(6 month)

## 2015-11-27 NOTE — Telephone Encounter (Signed)
Thanks for the reminder. 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 They had recommended a repeat MRCP in one year from that exam which is reasonable. Can you put in a recall for MRCP February 2018? thanks

## 2015-11-27 NOTE — Telephone Encounter (Signed)
Recall in for Feb 2018.

## 2015-11-27 NOTE — Telephone Encounter (Signed)
Dr. Havery Moros, back on 04/2015 you discussed a possible repeat MRCP in 6 months for this patient. Does she still need this?

## 2015-11-28 ENCOUNTER — Other Ambulatory Visit: Payer: Self-pay | Admitting: Family Medicine

## 2015-11-28 ENCOUNTER — Observation Stay (HOSPITAL_COMMUNITY)
Admission: EM | Admit: 2015-11-28 | Discharge: 2015-11-30 | Disposition: A | Discharge status: Hospice | Payer: Medicare Other | Attending: Internal Medicine | Admitting: Internal Medicine

## 2015-11-28 ENCOUNTER — Emergency Department (HOSPITAL_COMMUNITY): Payer: Medicare Other

## 2015-11-28 ENCOUNTER — Encounter (HOSPITAL_COMMUNITY): Payer: Self-pay | Admitting: Emergency Medicine

## 2015-11-28 DIAGNOSIS — K7460 Unspecified cirrhosis of liver: Secondary | ICD-10-CM | POA: Insufficient documentation

## 2015-11-28 DIAGNOSIS — G8929 Other chronic pain: Secondary | ICD-10-CM | POA: Diagnosis not present

## 2015-11-28 DIAGNOSIS — Z833 Family history of diabetes mellitus: Secondary | ICD-10-CM | POA: Insufficient documentation

## 2015-11-28 DIAGNOSIS — Z6841 Body Mass Index (BMI) 40.0 and over, adult: Secondary | ICD-10-CM | POA: Insufficient documentation

## 2015-11-28 DIAGNOSIS — Z66 Do not resuscitate: Secondary | ICD-10-CM | POA: Diagnosis not present

## 2015-11-28 DIAGNOSIS — G629 Polyneuropathy, unspecified: Secondary | ICD-10-CM | POA: Insufficient documentation

## 2015-11-28 DIAGNOSIS — Z9981 Dependence on supplemental oxygen: Secondary | ICD-10-CM | POA: Insufficient documentation

## 2015-11-28 DIAGNOSIS — R069 Unspecified abnormalities of breathing: Secondary | ICD-10-CM | POA: Diagnosis not present

## 2015-11-28 DIAGNOSIS — K7581 Nonalcoholic steatohepatitis (NASH): Secondary | ICD-10-CM | POA: Diagnosis not present

## 2015-11-28 DIAGNOSIS — F329 Major depressive disorder, single episode, unspecified: Secondary | ICD-10-CM | POA: Diagnosis not present

## 2015-11-28 DIAGNOSIS — D61818 Other pancytopenia: Secondary | ICD-10-CM | POA: Diagnosis not present

## 2015-11-28 DIAGNOSIS — R0600 Dyspnea, unspecified: Secondary | ICD-10-CM | POA: Diagnosis present

## 2015-11-28 DIAGNOSIS — Z515 Encounter for palliative care: Secondary | ICD-10-CM | POA: Insufficient documentation

## 2015-11-28 DIAGNOSIS — Z794 Long term (current) use of insulin: Secondary | ICD-10-CM | POA: Insufficient documentation

## 2015-11-28 DIAGNOSIS — E1122 Type 2 diabetes mellitus with diabetic chronic kidney disease: Secondary | ICD-10-CM

## 2015-11-28 DIAGNOSIS — Z87891 Personal history of nicotine dependence: Secondary | ICD-10-CM | POA: Insufficient documentation

## 2015-11-28 DIAGNOSIS — Z880 Allergy status to penicillin: Secondary | ICD-10-CM | POA: Insufficient documentation

## 2015-11-28 DIAGNOSIS — I5032 Chronic diastolic (congestive) heart failure: Secondary | ICD-10-CM | POA: Insufficient documentation

## 2015-11-28 DIAGNOSIS — E1165 Type 2 diabetes mellitus with hyperglycemia: Secondary | ICD-10-CM | POA: Insufficient documentation

## 2015-11-28 DIAGNOSIS — J441 Chronic obstructive pulmonary disease with (acute) exacerbation: Secondary | ICD-10-CM | POA: Diagnosis not present

## 2015-11-28 DIAGNOSIS — Z803 Family history of malignant neoplasm of breast: Secondary | ICD-10-CM | POA: Insufficient documentation

## 2015-11-28 DIAGNOSIS — J9611 Chronic respiratory failure with hypoxia: Secondary | ICD-10-CM | POA: Insufficient documentation

## 2015-11-28 DIAGNOSIS — I2510 Atherosclerotic heart disease of native coronary artery without angina pectoris: Secondary | ICD-10-CM | POA: Insufficient documentation

## 2015-11-28 DIAGNOSIS — F419 Anxiety disorder, unspecified: Secondary | ICD-10-CM | POA: Insufficient documentation

## 2015-11-28 DIAGNOSIS — E7800 Pure hypercholesterolemia, unspecified: Secondary | ICD-10-CM | POA: Diagnosis not present

## 2015-11-28 DIAGNOSIS — N183 Chronic kidney disease, stage 3 (moderate): Secondary | ICD-10-CM | POA: Insufficient documentation

## 2015-11-28 DIAGNOSIS — T380X5A Adverse effect of glucocorticoids and synthetic analogues, initial encounter: Secondary | ICD-10-CM | POA: Insufficient documentation

## 2015-11-28 DIAGNOSIS — I130 Hypertensive heart and chronic kidney disease with heart failure and stage 1 through stage 4 chronic kidney disease, or unspecified chronic kidney disease: Secondary | ICD-10-CM | POA: Diagnosis not present

## 2015-11-28 DIAGNOSIS — G4733 Obstructive sleep apnea (adult) (pediatric): Secondary | ICD-10-CM | POA: Insufficient documentation

## 2015-11-28 DIAGNOSIS — E118 Type 2 diabetes mellitus with unspecified complications: Secondary | ICD-10-CM | POA: Diagnosis not present

## 2015-11-28 DIAGNOSIS — J45909 Unspecified asthma, uncomplicated: Secondary | ICD-10-CM | POA: Insufficient documentation

## 2015-11-28 DIAGNOSIS — M1990 Unspecified osteoarthritis, unspecified site: Secondary | ICD-10-CM | POA: Insufficient documentation

## 2015-11-28 DIAGNOSIS — Z79899 Other long term (current) drug therapy: Secondary | ICD-10-CM | POA: Insufficient documentation

## 2015-11-28 DIAGNOSIS — R0602 Shortness of breath: Secondary | ICD-10-CM | POA: Diagnosis not present

## 2015-11-28 DIAGNOSIS — D649 Anemia, unspecified: Secondary | ICD-10-CM | POA: Diagnosis not present

## 2015-11-28 DIAGNOSIS — E785 Hyperlipidemia, unspecified: Secondary | ICD-10-CM | POA: Insufficient documentation

## 2015-11-28 DIAGNOSIS — Z8249 Family history of ischemic heart disease and other diseases of the circulatory system: Secondary | ICD-10-CM | POA: Insufficient documentation

## 2015-11-28 DIAGNOSIS — N390 Urinary tract infection, site not specified: Secondary | ICD-10-CM

## 2015-11-28 DIAGNOSIS — Z800 Family history of malignant neoplasm of digestive organs: Secondary | ICD-10-CM | POA: Insufficient documentation

## 2015-11-28 DIAGNOSIS — K31819 Angiodysplasia of stomach and duodenum without bleeding: Secondary | ICD-10-CM | POA: Insufficient documentation

## 2015-11-28 DIAGNOSIS — Z823 Family history of stroke: Secondary | ICD-10-CM | POA: Insufficient documentation

## 2015-11-28 LAB — GLUCOSE, CAPILLARY
GLUCOSE-CAPILLARY: 399 mg/dL — AB (ref 65–99)
Glucose-Capillary: 382 mg/dL — ABNORMAL HIGH (ref 65–99)

## 2015-11-28 LAB — TROPONIN I
TROPONIN I: 0.05 ng/mL — AB (ref <0.031)
TROPONIN I: 0.04 ng/mL — AB (ref <0.031)
Troponin I: 0.04 ng/mL — ABNORMAL HIGH (ref <0.031)
Troponin I: 0.06 ng/mL — ABNORMAL HIGH (ref <0.031)

## 2015-11-28 LAB — CBC WITH DIFFERENTIAL/PLATELET
Basophils Absolute: 0 10*3/uL (ref 0.0–0.1)
Basophils Relative: 0 %
Eosinophils Absolute: 0.2 10*3/uL (ref 0.0–0.7)
Eosinophils Relative: 3 %
HEMATOCRIT: 28.5 % — AB (ref 36.0–46.0)
HEMOGLOBIN: 8.9 g/dL — AB (ref 12.0–15.0)
LYMPHS ABS: 0.2 10*3/uL — AB (ref 0.7–4.0)
Lymphocytes Relative: 4 %
MCH: 30.4 pg (ref 26.0–34.0)
MCHC: 31.2 g/dL (ref 30.0–36.0)
MCV: 97.3 fL (ref 78.0–100.0)
MONOS PCT: 10 %
Monocytes Absolute: 0.5 10*3/uL (ref 0.1–1.0)
NEUTROS ABS: 4.5 10*3/uL (ref 1.7–7.7)
NEUTROS PCT: 84 %
Platelets: 82 10*3/uL — ABNORMAL LOW (ref 150–400)
RBC: 2.93 MIL/uL — ABNORMAL LOW (ref 3.87–5.11)
RDW: 16.7 % — ABNORMAL HIGH (ref 11.5–15.5)
WBC: 5.4 10*3/uL (ref 4.0–10.5)

## 2015-11-28 LAB — URINALYSIS, ROUTINE W REFLEX MICROSCOPIC
Bilirubin Urine: NEGATIVE
GLUCOSE, UA: NEGATIVE mg/dL
Ketones, ur: NEGATIVE mg/dL
Nitrite: NEGATIVE
PH: 6 (ref 5.0–8.0)
Protein, ur: 100 mg/dL — AB
SPECIFIC GRAVITY, URINE: 1.014 (ref 1.005–1.030)

## 2015-11-28 LAB — BASIC METABOLIC PANEL
ANION GAP: 13 (ref 5–15)
BUN: 33 mg/dL — ABNORMAL HIGH (ref 6–20)
CHLORIDE: 99 mmol/L — AB (ref 101–111)
CO2: 27 mmol/L (ref 22–32)
CREATININE: 1.11 mg/dL — AB (ref 0.44–1.00)
Calcium: 9.4 mg/dL (ref 8.9–10.3)
GFR calc non Af Amer: 48 mL/min — ABNORMAL LOW (ref >60)
GFR, EST AFRICAN AMERICAN: 56 mL/min — AB (ref >60)
GLUCOSE: 193 mg/dL — AB (ref 65–99)
Potassium: 3.7 mmol/L (ref 3.5–5.1)
Sodium: 139 mmol/L (ref 135–145)

## 2015-11-28 LAB — URINE MICROSCOPIC-ADD ON

## 2015-11-28 LAB — CBG MONITORING, ED: Glucose-Capillary: 167 mg/dL — ABNORMAL HIGH (ref 65–99)

## 2015-11-28 LAB — BRAIN NATRIURETIC PEPTIDE: B Natriuretic Peptide: 99 pg/mL (ref 0.0–100.0)

## 2015-11-28 MED ORDER — IPRATROPIUM-ALBUTEROL 0.5-2.5 (3) MG/3ML IN SOLN
3.0000 mL | RESPIRATORY_TRACT | Status: DC
  Administered 2015-11-28 – 2015-11-30 (×10): 3 mL via RESPIRATORY_TRACT
  Filled 2015-11-28 (×10): qty 3

## 2015-11-28 MED ORDER — TORSEMIDE 20 MG PO TABS
40.0000 mg | ORAL_TABLET | Freq: Every day | ORAL | Status: DC
  Administered 2015-11-28 – 2015-11-30 (×3): 40 mg via ORAL
  Filled 2015-11-28 (×4): qty 2

## 2015-11-28 MED ORDER — LORAZEPAM 2 MG/ML IJ SOLN
1.0000 mg | Freq: Once | INTRAMUSCULAR | Status: AC
  Administered 2015-11-28: 1 mg via INTRAVENOUS
  Filled 2015-11-28: qty 1

## 2015-11-28 MED ORDER — ONDANSETRON HCL 4 MG PO TABS
4.0000 mg | ORAL_TABLET | Freq: Four times a day (QID) | ORAL | Status: DC | PRN
Start: 2015-11-28 — End: 2015-11-30

## 2015-11-28 MED ORDER — PANTOPRAZOLE SODIUM 40 MG PO TBEC
40.0000 mg | DELAYED_RELEASE_TABLET | Freq: Two times a day (BID) | ORAL | Status: DC
  Administered 2015-11-28 – 2015-11-30 (×5): 40 mg via ORAL
  Filled 2015-11-28 (×5): qty 1

## 2015-11-28 MED ORDER — INSULIN GLARGINE 100 UNIT/ML ~~LOC~~ SOLN
120.0000 [IU] | Freq: Every day | SUBCUTANEOUS | Status: DC
  Administered 2015-11-29: 120 [IU] via SUBCUTANEOUS
  Filled 2015-11-28: qty 1.2

## 2015-11-28 MED ORDER — INSULIN ASPART 100 UNIT/ML ~~LOC~~ SOLN
0.0000 [IU] | Freq: Three times a day (TID) | SUBCUTANEOUS | Status: DC
  Administered 2015-11-28: 100 [IU] via SUBCUTANEOUS
  Administered 2015-11-28 – 2015-11-29 (×2): 15 [IU] via SUBCUTANEOUS
  Filled 2015-11-28: qty 1

## 2015-11-28 MED ORDER — LEVOFLOXACIN IN D5W 750 MG/150ML IV SOLN
750.0000 mg | INTRAVENOUS | Status: DC
  Administered 2015-11-28 – 2015-11-30 (×3): 750 mg via INTRAVENOUS
  Filled 2015-11-28 (×3): qty 150

## 2015-11-28 MED ORDER — METHYLPREDNISOLONE SODIUM SUCC 125 MG IJ SOLR
80.0000 mg | Freq: Once | INTRAMUSCULAR | Status: AC
  Administered 2015-11-28: 80 mg via INTRAVENOUS
  Filled 2015-11-28: qty 2

## 2015-11-28 MED ORDER — INSULIN GLARGINE 100 UNIT/ML SOLOSTAR PEN: 120.0000 [IU] | PEN_INJECTOR | SUBCUTANEOUS | Status: DC

## 2015-11-28 MED ORDER — IPRATROPIUM-ALBUTEROL 0.5-2.5 (3) MG/3ML IN SOLN: 3.0000 mL | RESPIRATORY_TRACT | Status: DC | PRN

## 2015-11-28 MED ORDER — VENLAFAXINE HCL ER 37.5 MG PO CP24
37.5000 mg | ORAL_CAPSULE | Freq: Every day | ORAL | Status: DC
  Administered 2015-11-28 – 2015-11-30 (×3): 37.5 mg via ORAL
  Filled 2015-11-28 (×3): qty 1

## 2015-11-28 MED ORDER — CIPROFLOXACIN IN D5W 400 MG/200ML IV SOLN
400.0000 mg | Freq: Once | INTRAVENOUS | Status: AC
  Administered 2015-11-28: 400 mg via INTRAVENOUS
  Filled 2015-11-28: qty 200

## 2015-11-28 MED ORDER — ALPRAZOLAM 0.25 MG PO TABS
0.5000 mg | ORAL_TABLET | Freq: Two times a day (BID) | ORAL | Status: DC | PRN
  Administered 2015-11-28 – 2015-11-30 (×3): 1 mg via ORAL
  Filled 2015-11-28 (×3): qty 4

## 2015-11-28 MED ORDER — PROMETHAZINE HCL 25 MG PO TABS
25.0000 mg | ORAL_TABLET | Freq: Two times a day (BID) | ORAL | Status: DC | PRN
Start: 2015-11-28 — End: 2015-11-30

## 2015-11-28 MED ORDER — LORATADINE 10 MG PO TABS
10.0000 mg | ORAL_TABLET | Freq: Every day | ORAL | Status: DC
  Administered 2015-11-28 – 2015-11-30 (×3): 10 mg via ORAL
  Filled 2015-11-28 (×3): qty 1

## 2015-11-28 MED ORDER — ONDANSETRON HCL 4 MG/2ML IJ SOLN: 4.0000 mg | Freq: Four times a day (QID) | INTRAMUSCULAR | Status: DC | PRN

## 2015-11-28 MED ORDER — IPRATROPIUM-ALBUTEROL 0.5-2.5 (3) MG/3ML IN SOLN
3.0000 mL | Freq: Once | RESPIRATORY_TRACT | Status: AC
  Administered 2015-11-28: 3 mL via RESPIRATORY_TRACT
  Filled 2015-11-28: qty 3

## 2015-11-28 MED ORDER — QUETIAPINE FUMARATE 25 MG PO TABS
200.0000 mg | ORAL_TABLET | Freq: Every day | ORAL | Status: DC
  Administered 2015-11-28 – 2015-11-29 (×2): 200 mg via ORAL
  Filled 2015-11-28 (×3): qty 8

## 2015-11-28 MED ORDER — FERROUS SULFATE 325 (65 FE) MG PO TABS
325.0000 mg | ORAL_TABLET | Freq: Every day | ORAL | Status: DC
  Administered 2015-11-29 – 2015-11-30 (×2): 325 mg via ORAL
  Filled 2015-11-28 (×2): qty 1

## 2015-11-28 MED ORDER — SODIUM CHLORIDE 0.9 % IV SOLN
Freq: Once | INTRAVENOUS | Status: AC
  Administered 2015-11-28: 14:00:00 via INTRAVENOUS

## 2015-11-28 MED ORDER — BUPROPION HCL ER (XL) 150 MG PO TB24
150.0000 mg | ORAL_TABLET | Freq: Every day | ORAL | Status: DC
  Administered 2015-11-28 – 2015-11-30 (×3): 150 mg via ORAL
  Filled 2015-11-28 (×4): qty 1

## 2015-11-28 MED ORDER — GUAIFENESIN ER 600 MG PO TB12
600.0000 mg | ORAL_TABLET | Freq: Two times a day (BID) | ORAL | Status: DC
  Administered 2015-11-28 – 2015-11-30 (×5): 600 mg via ORAL
  Filled 2015-11-28 (×5): qty 1

## 2015-11-28 MED ORDER — SIMVASTATIN 40 MG PO TABS
40.0000 mg | ORAL_TABLET | Freq: Every day | ORAL | Status: DC
  Administered 2015-11-28 – 2015-11-29 (×2): 40 mg via ORAL
  Filled 2015-11-28 (×3): qty 1

## 2015-11-28 MED ORDER — ROPINIROLE HCL 1 MG PO TABS
4.0000 mg | ORAL_TABLET | Freq: Every day | ORAL | Status: DC
  Administered 2015-11-28 – 2015-11-29 (×2): 4 mg via ORAL
  Filled 2015-11-28 (×3): qty 4

## 2015-11-28 MED ORDER — METHYLPREDNISOLONE SODIUM SUCC 40 MG IJ SOLR
40.0000 mg | Freq: Two times a day (BID) | INTRAMUSCULAR | Status: DC
  Administered 2015-11-28 – 2015-11-29 (×2): 40 mg via INTRAVENOUS
  Filled 2015-11-28 (×2): qty 1

## 2015-11-28 MED ORDER — OXYCODONE HCL 5 MG PO TABS
5.0000 mg | ORAL_TABLET | Freq: Four times a day (QID) | ORAL | Status: DC | PRN
  Administered 2015-11-29 – 2015-11-30 (×3): 5 mg via ORAL
  Filled 2015-11-28 (×3): qty 1

## 2015-11-28 MED ORDER — INSULIN GLARGINE 100 UNIT/ML ~~LOC~~ SOLN
120.0000 [IU] | Freq: Every day | SUBCUTANEOUS | Status: DC
  Filled 2015-11-28: qty 1.2

## 2015-11-28 MED ORDER — LACTULOSE 10 GM/15ML PO SOLN
20.0000 g | Freq: Three times a day (TID) | ORAL | Status: DC
Start: 2015-11-28 — End: 2015-11-30
  Administered 2015-11-28 – 2015-11-30 (×4): 20 g via ORAL
  Filled 2015-11-28 (×7): qty 30

## 2015-11-28 MED ORDER — POTASSIUM CHLORIDE CRYS ER 20 MEQ PO TBCR
40.0000 meq | EXTENDED_RELEASE_TABLET | Freq: Every day | ORAL | Status: DC
  Administered 2015-11-28 – 2015-11-30 (×3): 40 meq via ORAL
  Filled 2015-11-28 (×3): qty 2

## 2015-11-28 NOTE — Progress Notes (Signed)
At 1620 pt arrived to the floor from ED.  Oriented to the room.  Call bell at bedside.  Instructed to call for assistance.  Verbalized understanding.  Husband at bedside.  Will continue to monitor.  Vivek Grealish,RN

## 2015-11-28 NOTE — ED Notes (Signed)
Moved pt from stretcher to bed for comfort.

## 2015-11-28 NOTE — ED Notes (Signed)
Pt back to room A2 from xray

## 2015-11-28 NOTE — ED Notes (Signed)
Pt presents from home with [facility] for increasing SOB and anxiety since Sunday (11/26/15); pt reports hx of CHF and COPD with recent hospitalization; pt speaks in broken sentences taking a breath every couple of words

## 2015-11-28 NOTE — ED Notes (Signed)
Pt and spouse aware of bed assignment. Pt lying on bed w/head of bed elevated watching tv and talking w/spouse.

## 2015-11-28 NOTE — H&P (Signed)
[facility] History and Physical  MARGARIT GLADE [id number] DOB: 01-24-1941 DOA: 11/28/2015  Referring physician: Emergency Department PCP: Ria Bush, MD   CHIEF COMPLAINT:    Shortness of breath              HPI: [name] is a 75 y.o. female with multiple medical problems not limite dot chronic dialstolic heart failure, COPD on home 02, HTN, diabetes and CKD. She was hospitalized earlier this month with COPD exacerbation and acute on chronic diastolic heart failure. She was treated with IV diuretics, and steroids.   Patient developed shortness of breath this past Friday which didn't improve after nebulizers per usual. SOB unrelated to physical activity. Some coughing productive of clear phlegm.  Reports fevers of 100.1 at home.  No urinary symptoms. No abdominal pain. Waiting on Xifaxan to be shipped. Taking lactulose.    ED workup:    trop 0.06 <<<<< 0.04 BNP 99  EKG:    Sinus or ectopic atrial rhythm Prolonged PR interval Left axis deviation Probable lateral infarct, old                   Medications  ciprofloxacin (CIPRO) IVPB 400 mg (not administered)  ipratropium-albuterol (DUONEB) 0.5-2.5 (3) MG/3ML nebulizer solution 3 mL (not administered)  methylPREDNISolone sodium succinate (SOLU-MEDROL) 125 mg/2 mL injection 80 mg (not administered)  LORazepam (ATIVAN) injection 1 mg (1 mg Intravenous Given 11/28/15 0748)    Review of Systems  Constitutional: Positive for fever.  HENT: Positive for congestion.   Eyes: Negative.   Respiratory: Positive for cough and shortness of breath.   Cardiovascular: Positive for leg swelling.  Gastrointestinal: Negative.   Genitourinary: Negative.   Musculoskeletal: Negative.   Skin: Negative.   Neurological: Negative.   Endo/Heme/Allergies: Negative.   Psychiatric/Behavioral: Negative.     Past Medical History  Diagnosis Date  . Chronic airway obstruction, not elsewhere classified   . Unspecified chronic  bronchitis ([place])   . Unspecified essential hypertension   . Non-obstructive CAD   . Palpitations   . Mild aortic stenosis     a. 03/2014 Valve area (VTI): 2.89 cm^2, Valve area (Vmax): 2.7 cm^2.  . Pure hypercholesterolemia   . Morbid obesity ([place])   . Esophageal reflux   . GAVE (gastric antral vascular ectasia)     angiodysplasia; s/p multiple APCs, discussing RFA with [facility] GI Dr Newman Pies (06/2015)  . Diverticulosis of colon (without mention of hemorrhage)   . Benign neoplasm of colon   . Osteoarthrosis, unspecified whether generalized or localized, unspecified site   . Osteoporosis, unspecified   . Restless legs syndrome (RLS)   . Major depressive disorder, recurrent episode, severe, specified as with psychotic behavior   . Iron deficiency anemia secondary to blood loss (chronic)     a. frequent PRBC transfusions.  . Coarse tremors     a. arms.  . Carpal tunnel syndrome on both sides   . Chronic diastolic CHF (congestive heart failure) ([place])     a. 03/26/2014 Echo: EF 55-60%, no rwma, mild AS/AI, mod-sev Ca2+ MV annulus, mildly to mod dil LA.  [name] Asthma   . Type II diabetes mellitus ([place])   . H/O hiatal hernia   . 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 dx'd 1990's  . Adenomatous colon polyp   . Midsternal chest pain     a. conservatively managed ->poor candidate for cath/anticoagulation.  [name] Anxiety   . Portal hypertensive gastropathy   .  Falls frequently     completed HHPT/OT 06/2014, PT unmet goals  . Recurrent UTI (urinary tract infection)     h/o hospitalization with urosepsis 2015, but thought large component colonization/bacteriuria, only treat if symptomatic ([place])  . Hiatal hernia   . Thrombocytopenia ([place])   . Protein calorie malnutrition ([place])   . On home oxygen therapy     "3L; 24/7" (11/02/2015)  . OSA (obstructive sleep apnea)     a. does not use CPAP. (01/25/2015)  . Hepatitis   . CAP (community acquired pneumonia) 2015    Past Surgical History  Procedure Laterality Date  . Hemorroidectomy    . Appendectomy    . Vaginal hysterectomy    . Breast biopsy      bilateral  . Cesarean section      x 3  . Appendectomy    . Esophagogastroduodenoscopy  06/12/2012    Procedure: ESOPHAGOGASTRODUODENOSCOPY (EGD);  Surgeon: Lafayette Dragon, MD;  Location: [facility];  Service: Endoscopy;  Laterality: N/A;  . Balloon dilation  06/12/2012    Procedure: BALLOON DILATION;  Surgeon: Lafayette Dragon, MD;  Location: [facility];  Service: Endoscopy;  Laterality: N/A;  ?balloon  . Esophagogastroduodenoscopy  09/10/2012    Procedure: ESOPHAGOGASTRODUODENOSCOPY (EGD);  Surgeon: Lafayette Dragon, MD;  Location: [facility];  Service: Endoscopy;  Laterality: N/A;  . Hot hemostasis  09/10/2012    Procedure: HOT HEMOSTASIS (ARGON PLASMA COAGULATION/BICAP);  Surgeon: Lafayette Dragon, MD;  Location: [facility];  Service: Endoscopy;  Laterality: N/A;  . Esophagogastroduodenoscopy N/A 01/18/2013    Procedure: ESOPHAGOGASTRODUODENOSCOPY (EGD);  Surgeon: Lafayette Dragon, MD;  Location: [facility] ENDOSCOPY;  Service: Endoscopy;  Laterality: N/A;  . Esophagogastroduodenoscopy N/A 05/24/2013    Procedure: ESOPHAGOGASTRODUODENOSCOPY (EGD);  Surgeon: Jerene Bears, MD;  Location: [place];  Service: Gastroenterology;  Laterality: N/A;  . Hot hemostasis N/A 05/24/2013    Procedure: HOT HEMOSTASIS (ARGON PLASMA COAGULATION/BICAP);  Surgeon: Jerene Bears, MD;  Location: [place];  Service: Gastroenterology;  Laterality: N/A;  . US echocardiography  07/2014    mild LVH, EF 60-65%, normal wall motion, mild AR, mod dilated LA, mildly dilated RA, peak PA pressure 51mmHg  . Esophagogastroduodenoscopy N/A 10/20/2014    Procedure: ESOPHAGOGASTRODUODENOSCOPY (EGD);  Surgeon: Lafayette Dragon, MD;  Location: [facility];  Service: Endoscopy;  Laterality: N/A;  . Hot hemostasis N/A 10/20/2014    Procedure: HOT HEMOSTASIS (ARGON PLASMA COAGULATION/BICAP);  Surgeon:  Lafayette Dragon, MD;  Location: [facility];  Service: Endoscopy;  Laterality: N/A;  . Enteroscopy N/A 02/25/2015    Procedure: ENTEROSCOPY;  Surgeon: [name] Ada, MD;  Location: [place];  Service: Endoscopy;  Laterality: N/A;  . Enteroscopy N/A 06/27/2015    Procedure: ENTEROSCOPY;  Surgeon: Manus Gunning, MD;  Location: [facility];  Service: Gastroenterology;  Laterality: N/A;  . Eus  09/2015    antral CAVL, ablated; nodular erosive gastritis; mult pancreatic cysts rec rpt CT pancreas protocol or MRI to survey pancreas cysts 1-2 yrs (Dr Amie Critchley)    SOCIAL HISTORY:  reports that she quit smoking about 23 years ago. Her smoking use included Cigarettes. She has a 37.5 pack-year smoking history. She has never used smokeless tobacco. She reports that she does not drink alcohol or use illicit drugs. Lives: at home with family member. Has daytime caregiver. Assistive devices:   Walker needed for ambulation.   Allergies  Allergen Reactions  . Acetaminophen Other (See Comments)    Liver dysfunction  . Nsaids  Other (See Comments)    Liver dysfunction   . Aspirin Other (See Comments)    Causes nosebleeds  . Theophylline Nausea And Vomiting  . Penicillin G Itching  . Penicillins Itching    Has patient had a PCN reaction causing immediate rash, facial/tongue/throat swelling, SOB or lightheadedness with hypotension: unknown Has patient had a PCN reaction causing severe rash involving mucus membranes or skin necrosis: unknown Has patient had a PCN reaction that required hospitalization unknown Has patient had a PCN reaction occurring within the last 10 years: yes If all of the above answers are "NO", then may proceed with Cephalosporin use.     Family History  Problem Relation Age of Onset  . Heart disease Mother   . Cervical cancer Mother   . Kidney disease Mother   . Diabetes Mother   . Breast cancer Sister   . Multiple sclerosis Sister   . Colon cancer Neg Hx   . Breast  cancer Maternal Aunt     x 2  . Diabetes Father   . Diabetes Sister   . Multiple sclerosis Other   . Heart attack Mother   . Heart attack Father   . Stroke Daughter   . Stroke Daughter     Prior to Admission medications   Medication Sig Start Date End Date Taking? Authorizing Provider  ADVAIR DISKUS 250-50 MCG/DOSE AEPB INHALE 1 PUFF INTO LUNGS TWICE A DAY 05/29/15  Yes Ria Bush, MD  albuterol (PROAIR HFA) 108 (90 BASE) MCG/ACT inhaler Inhale 2 puffs into the lungs every 6 (six) hours as needed for wheezing or shortness of breath. Reported on 08/18/2015   Yes Historical Provider, MD  ALPRAZolam Duanne Moron) 1 MG tablet Take 0.5-1 tablets (0.5-1 mg total) by mouth 2 (two) times daily as needed for anxiety or sleep. 11/13/15  Yes Tonia Ghent, MD  B Complex-C ([employer] B COMPLEX PLUS C) TABS TAKE ONE CAPSULE BY MOUTH EVERY DAY 11/16/15  Yes Ria Bush, MD  buPROPion (WELLBUTRIN XL) 150 MG 24 hr tablet Take 150 mg by mouth daily.   Yes Historical Provider, MD  desvenlafaxine (PRISTIQ) 50 MG 24 hr tablet Take 3 tablets (150 mg total) by mouth daily. 05/12/15  Yes Ria Bush, MD  ferrous sulfate 325 (65 FE) MG tablet Take 1 tablet (325 mg total) by mouth daily with breakfast. 06/15/15  Yes Manus Gunning, MD  fluconazole (DIFLUCAN) 150 MG tablet Take 1 tablet (150 mg total) by mouth once. may repeat in 4 days if needed Patient taking differently: Take 150 mg by mouth See admin instructions. Take 1 tablet on day one, may repeat in 4 days if needed for infection 11/23/15  Yes Ria Bush, MD  glucose blood (ONE TOUCH ULTRA TEST) test strip Check blood sugar 4 times daily 10/16/15  Yes Ria Bush, MD  guaiFENesin (MUCINEX) 600 MG 12 hr tablet Take 600 mg by mouth 2 (two) times daily.    Yes Historical Provider, MD  Insulin Glargine (LANTUS SOLOSTAR) 100 UNIT/ML Solostar Pen Inject 120 Units into the skin every morning. 04/13/15  Yes Renato Shin, MD  insulin lispro (HUMALOG  KWIKPEN) 100 UNIT/ML KiwkPen Inject 10-25 Units into the skin daily with supper.    Yes Historical Provider, MD  Insulin Pen Needle (B-D ULTRAFINE III SHORT PEN) 31G X 8 MM MISC Use as directed to inject insulin twice daily. Dx: E11.8 10/13/15  Yes Renato Shin, MD  KLOR-CON M20 20 MEQ tablet TAKE 2 TABLETS EVERY DAY 11/20/15  Yes Ria Bush, MD  lactulose, encephalopathy, (CHRONULAC) 10 GM/15ML SOLN Take 20 g by mouth 3 (three) times daily.  11/21/15  Yes Ria Bush, MD  levalbuterol Penne Lash) 0.63 MG/3ML nebulizer solution Take 0.63 mg by nebulization every 8 (eight) hours as needed for wheezing or shortness of breath.  06/05/14  Yes Historical Provider, MD  loratadine (CLARITIN) 10 MG tablet TAKE 1 TABLET BY MOUTH EVERY DAY 07/14/15  Yes Ria Bush, MD  metolazone (ZAROXOLYN) 2.5 MG tablet Take as directed for 4 lb weight gain 10/27/15  Yes Sherren Mocha, MD  Multiple Vitamins-Minerals (CENTRUM SILVER PO) Take 1 tablet by mouth daily.    Yes Historical Provider, MD  mupirocin cream (BACTROBAN) 2 % Apply 1 application topically 2 (two) times daily. Reported on 11/14/2015   Yes Historical Provider, MD  oxycodone (OXY-IR) 5 MG capsule Take 1 capsule (5 mg total) by mouth every 6 (six) hours as needed for pain. 11/13/15  Yes Tonia Ghent, MD  pantoprazole (PROTONIX) 40 MG tablet TAKE 1 TABLET BY MOUTH TWICE A DAY 10/12/15  Yes Ria Bush, MD  promethazine (PHENERGAN) 25 MG tablet Take 25 mg by mouth 2 (two) times daily as needed for nausea or vomiting.   Yes Historical Provider, MD  QUEtiapine (SEROQUEL) 200 MG tablet TAKE 1 TABLET BY MOUTH AT BEDTIME 11/24/15  Yes Ria Bush, MD  rifaximin (XIFAXAN) 550 MG TABS tablet Take 550 mg by mouth 2 (two) times daily. Reported on 10/17/2015   Yes Historical Provider, MD  rOPINIRole (REQUIP) 4 MG tablet Take 4 mg by mouth at bedtime.   Yes Historical Provider, MD  simvastatin (ZOCOR) 40 MG tablet Take 40 mg by mouth daily.   Yes  Historical Provider, MD  torsemide (DEMADEX) 20 MG tablet Take 2 tablets (40 mg total) by mouth daily. 11/06/15  Yes Shanker Kristeen Mans, MD  Vitamin D, Ergocalciferol, (DRISDOL) 50000 units CAPS capsule TAKE ONE CAPSULE WEEKLY Patient taking differently: TAKE ONE CAPSULE WEEKLY ON FRIDAY 10/23/15  Yes Ria Bush, MD   PHYSICAL EXAM: Filed Vitals:   11/28/15 0730 11/28/15 0830 11/28/15 0900 11/28/15 0950  BP: 162/61 145/68 161/60   Pulse: 82 92 79   Temp:    100.1 F (37.8 C)  TempSrc:    Oral  Resp: 20 24 27    Weight:      SpO2: 96% 98% 97%     Wt Readings from Last 3 Encounters:  11/28/15 99.338 kg (219 lb)  11/21/15 101.719 kg (224 lb 4 oz)  11/14/15 99.338 kg (219 lb)    General:  Pleasant obese white female. Appears calm and comfortable Eyes: PER, normal lids, irises & conjunctiva ENT: grossly normal hearing, lips & tongue Neck: no LAD, no masses Cardiovascular: RRR, + murmur, 2+ BLE edema. [name]  Respiratory: Respirations slightly labored on [place]. Poor air movement. Bilateral rhonchi.    Abdomen: soft, non-distended, non-tender, active bowel sounds. No obvious masses.  Skin: no rash seen on limited exam Musculoskeletal: grossly normal tone BUE/BLE Psychiatric: grossly normal mood and affect, speech fluent and appropriate Neurologic: grossly non-focal.         LABS ON ADMISSION:    Basic Metabolic Panel:  Recent Labs Lab 11/21/15 1335 11/28/15 0603  NA 136 139  K 3.5 3.7  CL 98 99*  CO2 30 27  GLUCOSE 413* 193*  BUN 37* 33*  CREATININE 1.30* 1.11*  CALCIUM 8.9 9.4  PHOS 4.6  --    Liver Function Tests:  Recent Labs Lab  11/21/15 1335  ALBUMIN 3.3*    CBC:  Recent Labs Lab 11/28/15 0603  WBC 5.4  NEUTROABS 4.5  HGB 8.9*  HCT 28.5*  MCV 97.3  PLT 82*    CREATININE: 1.11 mg/dL ABNORMAL (11/28/15 0603) Estimated creatinine clearance - 50.5 mL/min  Radiological Exams on Admission: Dg Chest 2 View  11/28/2015  CLINICAL DATA:  Shortness of  breath. EXAM: CHEST  2 VIEW COMPARISON:  10/2015.  08/28/2015. FINDINGS: Mediastinum hilar structures normal. Cardiomegaly. No pulmonary vascular prominence. Mild left mid lung field and right base subsegmental atelectasis. No pleural effusion or pneumothorax. IMPRESSION: 1. Stable cardiomegaly.  No pulmonary venous congestion. 2. Mild left mid lung field and right base subsegmental atelectasis. Electronically Signed   By: Marcello Moores  Register   On: 11/28/2015 07:08     ASSESSMENT / PLAN   Low grade fever / respiratory distress with underlying COPD on home 02.  Sats normal but has increased WOB. Atelectasis on CXR, no other findings. This could be a recurrent COPD exacerbation. Anxiety may be contributing factor.  -Admit to observation - telemetry bed -COPD exacerbation order sets utilized -Solu-Medrol started in ED, we will continue -Scheduled nebulizers, when necessary nebs as well -mucinex -U/A not overly suspicious but will send for culture given fevers. -check influenza panel -Cipro started in ED for possible UTI, will change to Levaquin to cover both COPD       exacerbation and possible UTI.   Hx of chronic diastolic heart failure, grade II.  -continue home demadex -I&0 -daily weights  Elevated troponin. No chest pain. No acute acute EKG changes. Cardiac catheterization in 2009 demonstrated patent coronary arteries with mild nonobstructive disease. Troponins elevated during her earlier admission this month, peaked at 0.04. Cardiology evaluated, patient not candidate for further ischemic workup.  -monitor on telemetry -Will trend troponins and consult Cardiology only if become significantly elevated.   Type II diabetes. Last A1c around 6 in January. -Repeat hemoglobin A1c -Patient can eat, will continue home am lantus insulin and add sliding scale  OSA. -Cannot tolerate CPAP  NASH cirrhosis complicated by portal hypertension. No evidence for decompensation at this time. Followed by   GI.  -continue home lactulose .  Chronic anemia requiring transfusions and likely secondary to GAVE. Last EGD with [facility] November 2017. Hgb stable. No overt GI bleeding at present -continue home iron  Hyperlipidemia.  -continue home statin  Anxiety / Depression. Stable.  -continue home anti-depressant and anxiolytic  GERD. Asymptomatic -continue home PPI  CONSULTANTS:   none   Code Status: DNR DVT Prophylaxis:  SCDs  Family Communication:  Patient alert, oriented and understands plan of care.  Disposition Plan: Discharge to home in 24-48 hours   Time spent: 60 minutes Tye Savoy  NP [facility] Pager [phone]

## 2015-11-28 NOTE — ED Notes (Signed)
Per pharmacist, administer Lantus now - aware pt has not had today.

## 2015-11-28 NOTE — ED Notes (Signed)
Pt c/o feeling hot - cool cloth given as requested and temp adjusted in room.

## 2015-11-28 NOTE — ED Notes (Signed)
Pt arrived to C24 via stretcher - pt sitting up on stretcher, alert, oriented, eating lunch. Spouse at bedside. O2 infusing via n/c at 3L/min. Monitor intact to pt.

## 2015-11-28 NOTE — ED Notes (Signed)
Family at bedside. 

## 2015-11-28 NOTE — ED Provider Notes (Signed)
7:45 AM Assumed care at change of shift. EKG w/o acute change. CXR without significant acute findings. BNP is normal. Weight today 2lbs less than discharge weight a few weeks ago. Suspect this may be more underlying lung disease and not HF. Anemia stable.Troponin is minimally elevated. Repeat higher although still just minimally elevated. C/o subjective fever in ED. Repeat temp 100.1. UA looks like possible UTI. Will admit.   Virgel Manifold, MD 11/28/15 1025

## 2015-11-29 ENCOUNTER — Inpatient Hospital Stay (HOSPITAL_COMMUNITY): Admission: RE | Admit: 2015-11-29 | Payer: Medicare Other | Source: Ambulatory Visit

## 2015-11-29 ENCOUNTER — Telehealth: Payer: Self-pay | Admitting: Cardiovascular Disease

## 2015-11-29 ENCOUNTER — Encounter (HOSPITAL_COMMUNITY): Payer: Self-pay | Admitting: General Practice

## 2015-11-29 DIAGNOSIS — J441 Chronic obstructive pulmonary disease with (acute) exacerbation: Secondary | ICD-10-CM

## 2015-11-29 DIAGNOSIS — I5043 Acute on chronic combined systolic (congestive) and diastolic (congestive) heart failure: Secondary | ICD-10-CM

## 2015-11-29 DIAGNOSIS — J9611 Chronic respiratory failure with hypoxia: Secondary | ICD-10-CM | POA: Diagnosis not present

## 2015-11-29 DIAGNOSIS — K7460 Unspecified cirrhosis of liver: Secondary | ICD-10-CM

## 2015-11-29 DIAGNOSIS — I5032 Chronic diastolic (congestive) heart failure: Secondary | ICD-10-CM | POA: Diagnosis not present

## 2015-11-29 LAB — URINE CULTURE

## 2015-11-29 LAB — BASIC METABOLIC PANEL
ANION GAP: 11 (ref 5–15)
BUN: 35 mg/dL — AB (ref 6–20)
CHLORIDE: 100 mmol/L — AB (ref 101–111)
CO2: 26 mmol/L (ref 22–32)
Calcium: 9.1 mg/dL (ref 8.9–10.3)
Creatinine, Ser: 1.25 mg/dL — ABNORMAL HIGH (ref 0.44–1.00)
GFR calc non Af Amer: 42 mL/min — ABNORMAL LOW (ref >60)
GFR, EST AFRICAN AMERICAN: 49 mL/min — AB (ref >60)
Glucose, Bld: 392 mg/dL — ABNORMAL HIGH (ref 65–99)
POTASSIUM: 4.9 mmol/L (ref 3.5–5.1)
SODIUM: 137 mmol/L (ref 135–145)

## 2015-11-29 LAB — GLUCOSE, CAPILLARY
GLUCOSE-CAPILLARY: 426 mg/dL — AB (ref 65–99)
GLUCOSE-CAPILLARY: 413 mg/dL — AB (ref 65–99)
GLUCOSE-CAPILLARY: 259 mg/dL — AB (ref 65–99)
Glucose-Capillary: 470 mg/dL — ABNORMAL HIGH (ref 65–99)

## 2015-11-29 LAB — CBC
HEMATOCRIT: 26.3 % — AB (ref 36.0–46.0)
HEMOGLOBIN: 8.1 g/dL — AB (ref 12.0–15.0)
MCH: 30 pg (ref 26.0–34.0)
MCHC: 30.8 g/dL (ref 30.0–36.0)
MCV: 97.4 fL (ref 78.0–100.0)
Platelets: 71 10*3/uL — ABNORMAL LOW (ref 150–400)
RBC: 2.7 MIL/uL — AB (ref 3.87–5.11)
RDW: 16.4 % — ABNORMAL HIGH (ref 11.5–15.5)
WBC: 3.9 10*3/uL — AB (ref 4.0–10.5)

## 2015-11-29 LAB — HEMOGLOBIN A1C
Hgb A1c MFr Bld: 6.9 % — ABNORMAL HIGH (ref 4.8–5.6)
Mean Plasma Glucose: 151 mg/dL

## 2015-11-29 MED ORDER — INSULIN ASPART 100 UNIT/ML ~~LOC~~ SOLN: 25.0000 [IU] | Freq: Once | SUBCUTANEOUS | Status: DC

## 2015-11-29 MED ORDER — INSULIN ASPART 100 UNIT/ML ~~LOC~~ SOLN
0.0000 [IU] | Freq: Three times a day (TID) | SUBCUTANEOUS | Status: DC
  Administered 2015-11-29: 20 [IU] via SUBCUTANEOUS
  Administered 2015-11-30: 11 [IU] via SUBCUTANEOUS
  Administered 2015-11-30: 20 [IU] via SUBCUTANEOUS

## 2015-11-29 MED ORDER — INSULIN ASPART 100 UNIT/ML ~~LOC~~ SOLN
12.0000 [IU] | Freq: Three times a day (TID) | SUBCUTANEOUS | Status: DC
  Administered 2015-11-30 (×2): 12 [IU] via SUBCUTANEOUS

## 2015-11-29 MED ORDER — INSULIN ASPART 100 UNIT/ML ~~LOC~~ SOLN
8.0000 [IU] | Freq: Three times a day (TID) | SUBCUTANEOUS | Status: DC
  Administered 2015-11-29: 8 [IU] via SUBCUTANEOUS

## 2015-11-29 MED ORDER — DARBEPOETIN ALFA 200 MCG/0.4ML IJ SOSY
200.0000 ug | PREFILLED_SYRINGE | Freq: Once | INTRAMUSCULAR | Status: AC
  Administered 2015-11-29: 200 ug via SUBCUTANEOUS
  Filled 2015-11-29: qty 0.4

## 2015-11-29 MED ORDER — INSULIN GLARGINE 100 UNIT/ML ~~LOC~~ SOLN
135.0000 [IU] | Freq: Every day | SUBCUTANEOUS | Status: DC
  Administered 2015-11-30: 135 [IU] via SUBCUTANEOUS
  Filled 2015-11-29: qty 1.35

## 2015-11-29 MED ORDER — RIFAXIMIN 550 MG PO TABS
550.0000 mg | ORAL_TABLET | Freq: Two times a day (BID) | ORAL | Status: DC
  Administered 2015-11-29 – 2015-11-30 (×3): 550 mg via ORAL
  Filled 2015-11-29 (×3): qty 1

## 2015-11-29 MED ORDER — PREDNISONE 20 MG PO TABS
30.0000 mg | ORAL_TABLET | Freq: Every day | ORAL | Status: DC
  Administered 2015-11-30: 30 mg via ORAL
  Filled 2015-11-29 (×2): qty 1

## 2015-11-29 MED ORDER — INSULIN ASPART 100 UNIT/ML ~~LOC~~ SOLN
25.0000 [IU] | Freq: Once | SUBCUTANEOUS | Status: AC
  Administered 2015-11-29: 25 [IU] via SUBCUTANEOUS

## 2015-11-29 NOTE — Consult Note (Signed)
Patient was sleeping; appeared as though she was having rough dream.  [place] available if pt still would like visit. [name] 1:25 PM

## 2015-11-29 NOTE — Progress Notes (Signed)
Pt remains on home regimen and needs to remain the same

## 2015-11-29 NOTE — Care Management Obs Status (Signed)
[place] NOTIFICATION   Patient Details  Name: [name] MRN: [id number] Date of Birth: 11-15-1940   Medicare Observation Status Notification Given:  Yes    Royston Bake, RN 11/29/2015, 11:57 AM

## 2015-11-29 NOTE — Telephone Encounter (Signed)
New Message  RN Case manager for [facility] calling to confirm CHF dx, and get latest EF. Please call back and discuss.

## 2015-11-29 NOTE — Progress Notes (Signed)
Pt states her home regimen is Q4.[name] Based on score patient needs txs to remain as home reg as well.

## 2015-11-29 NOTE — Evaluation (Addendum)
Physical Therapy Evaluation Patient Details Name: [name] MRN: [id number] DOB: 25-Oct-1940 Today's Date: 11/29/2015   History of Present Illness  75 year old unfortunate female with history of cirrhosis, COPD, chronic diastolic heart failure pancytopenia, chronic GI bleeding from GAVE/AVM's-multiple hospitalizations recently for similar issues-with shortness of breath   Clinical Impression  Patient seen for mobility evaluation and activity assessment. Patient mobilizing without need for physical assist but does demonstrates desaturations on supplemental O2 with fatigue. On 4 liters dropped to 91% over course of 80 ft. Patient educated on energy conservation and safety with fatigue and mobility. Patient receptive. Encourage continued mobility with hospital staff during hospital course, no further acute PT needs at this time.     Follow Up Recommendations No PT follow up    Equipment Recommendations  None recommended by PT    Recommendations for Other Services       Precautions / Restrictions Precautions Precautions: Fall Restrictions Weight Bearing Restrictions: No      Mobility  Bed Mobility Overal bed mobility: Modified Independent                Transfers Overall transfer level: Needs assistance Equipment used: Rolling walker (2 wheeled) Transfers: Sit to/from Stand Sit to Stand: Supervision            Ambulation/Gait Ambulation/Gait assistance: Supervision Ambulation Distance (Feet): 80 Feet (40 ft x2) Assistive device: Rolling walker (2 wheeled) Gait Pattern/deviations: Step-through pattern;Decreased stride length;Trunk flexed Gait velocity: decreased Gait velocity interpretation: Below normal speed for age/gender General Gait Details: ambulated on 4 liters with saturations dropping to 91%. Increased DOE and fatigue noted  Stairs            Wheelchair Mobility    Modified Rankin (Stroke Patients Only)       Balance                                              Pertinent Vitals/Pain Pain Assessment: No/denies pain    Home Living Family/patient expects to be discharged to:: Private residence Living Arrangements: Spouse/significant other Available Help at Discharge: Family;Neighbor;Available 24 hours/day Type of Home: House Home Access: Ramped entrance;Stairs to enter     Home Layout: One level Home Equipment: [place] - 2 wheels;Walker - 4 wheels;Bedside commode;Wheelchair - manual;Hospital bed;Shower seat Additional Comments: On 3L O2 at home, normally sponge bath at sink sitting on toilet    Prior Function Level of Independence: Needs assistance      ADL's / Homemaking Assistance Needed: does sponge bath sitting on toilet, friend is present 8am-12pm daily to help with meals. Spouse home all other times but patient describes him at [facility] with failing health.   Comments: Pt reports her spouse has hearing problems and this past year has been very difficult communication.      Hand Dominance   Dominant Hand: Right    Extremity/Trunk Assessment   Upper Extremity Assessment: Overall WFL for tasks assessed           Lower Extremity Assessment: Overall WFL for tasks assessed      Cervical / Trunk Assessment: Kyphotic  Communication   Communication: No difficulties  Cognition Arousal/Alertness: Awake/alert Behavior During Therapy: WFL for tasks assessed/performed Overall Cognitive Status: Within Functional Limits for tasks assessed (reports depression at baseline)  General Comments General comments (skin integrity, edema, etc.): discussed energy conservation and saturation assessment with patient. Patient discussed generalized fatigue with limited activity and her frustration. Patient wants to be able to do more she reports.    Exercises        Assessment/Plan    PT Assessment Patent does not need any further PT services  PT Diagnosis Difficulty  walking   PT Problem List    PT Treatment Interventions     PT Goals (Current goals can be found in the Care Plan section) Acute Rehab PT Goals PT Goal Formulation: All assessment and education complete, DC therapy    Frequency     Barriers to discharge        Co-evaluation               End of Session Equipment Utilized During Treatment: Gait belt;Oxygen Activity Tolerance: Patient tolerated treatment well Patient left: in bed;with call bell/phone within reach Nurse Communication: Mobility status         Time: [id number] PT Time Calculation (min) (ACUTE ONLY): 18 min   Charges:   PT Evaluation $PT Eval Moderate Complexity: 1 Procedure     PT G Codes:   PT G-Codes **NOT FOR INPATIENT CLASS** Functional Assessment Tool Used: clinical judgement Functional Limitation: Mobility: Walking and moving around Mobility: Walking and Moving Around Current Status [id number]): At least 1 percent but less than 20 percent impaired, limited or restricted Mobility: Walking and Moving Around Goal Status [phone]): At least 1 percent but less than 20 percent impaired, limited or restricted Mobility: Walking and Moving Around Discharge Status [phone]): At least 1 percent but less than 20 percent impaired, limited or restricted    Duncan Dull 11/29/2015, 4:49 PM Alben Deeds, [place] DPT  [phone]

## 2015-11-29 NOTE — Evaluation (Signed)
Occupational Therapy Evaluation Patient Details Name: [name] MRN: [id number] DOB: 05-Sep-1940 Today's Date: 11/29/2015    History of Present Illness  75 y.o. female with a Past Medical History of COPD, nonobstructive CAD, aortic stenosis, HLD, GA VE syndrome, RLS, depression, iron deficiency anemia, diastolic CHF, DM, Nash, hiatal hernia, chronic thrombocytopenia who presents with possible UTI and dyspnea on exertion likely secondary to COPD exacerbation. UA sample contaminated and a symptomatically so unlikely to be causing any patient's symptoms.     Clinical Impression   Patient evaluated by Occupational Therapy with no further acute OT needs identified. All education has been completed and the patient has no further questions. See below for any follow-up Occupational Therapy or equipment needs. OT to sign off. Thank you for referral.      Follow Up Recommendations  No OT follow up    Equipment Recommendations  None recommended by OT    Recommendations for Other Services       Precautions / Restrictions Precautions Precautions: Fall      Mobility Bed Mobility Overal bed mobility: Modified Independent                Transfers Overall transfer level: Needs assistance Equipment used: Rolling walker (2 wheeled) Transfers: Sit to/from Stand Sit to Stand: Supervision              Balance                                            ADL Overall ADL's : At baseline                                       General ADL Comments: pt able to complete bath at sink level from seated position. pt provided flash cards of needs "food, drink, nap, BSC, oxygen" that patient needs quickly from spouse. pt will be able to show the word of her need quickly to decr anxiety when SOB occurrs. Pt reports "these are great!"  Second set provided to make sure pt had spare copy for multiple locations in the home.     Vision Vision  Assessment?: No apparent visual deficits   Perception     Praxis      Pertinent Vitals/Pain Pain Assessment: No/denies pain     Hand Dominance Right   Extremity/Trunk Assessment Upper Extremity Assessment Upper Extremity Assessment: Overall WFL for tasks assessed   Lower Extremity Assessment Lower Extremity Assessment: Defer to PT evaluation   Cervical / Trunk Assessment Cervical / Trunk Assessment: Kyphotic   Communication Communication Communication: No difficulties   Cognition Arousal/Alertness: Awake/alert Behavior During Therapy: WFL for tasks assessed/performed Overall Cognitive Status: Within Functional Limits for tasks assessed (reports depression at baseline)                     General Comments       Exercises       Shoulder Instructions      Home Living Family/patient expects to be discharged to:: Private residence Living Arrangements: Spouse/significant other Available Help at Discharge: Family;Neighbor;Available 24 hours/day Type of Home: House Home Access: Ramped entrance;Stairs to enter     Home Layout: One level     Bathroom Shower/Tub: Teacher, early years/pre: Standard  Home Equipment: Melvin - 2 wheels;Walker - 4 wheels;Bedside commode;Wheelchair - manual;Hospital bed;Shower seat   Additional Comments: On 3L O2 at home, normally sponge bath at sink sitting on toilet      Prior Functioning/Environment Level of Independence: Needs assistance    ADL's / Homemaking Assistance Needed: does sponge bath sitting on toilet, friend is present 8am-12pm daily to help with meals. Spouse home all other times but patient describes him at [facility] with failing health.    Comments: Pt reports her spouse has hearing problems and this past year has been very difficult communication.     OT Diagnosis: Generalized weakness   OT Problem List:     OT Treatment/Interventions:      OT Goals(Current goals can be found in the care plan  section) Acute Rehab OT Goals OT Goal Formulation: With patient/family Potential to Achieve Goals: Good  OT Frequency:     Barriers to D/C:            Co-evaluation              End of Session Equipment Utilized During Treatment: Rolling walker;Oxygen Nurse Communication: Mobility status;Precautions  Activity Tolerance: Patient tolerated treatment well Patient left: in bed;with call bell/phone within reach;with family/visitor present   Time: [id number] OT Time Calculation (min): 25 min Charges:  OT General Charges $OT Visit: 1 Procedure OT Evaluation $OT Eval Moderate Complexity: 1 Procedure G-Codes:    Parke Poisson B 12-24-2015, 11:41 AM   Jeri Modena   OTR/L Pager: [phone] Office: [phone] .

## 2015-11-29 NOTE — Progress Notes (Signed)
Nutrition Brief Note  RD consulted for assessment of nutrition status/recommendations.   75 y.o. female with a Past Medical History of COPD, nonobstructive CAD, aortic stenosis, HLD, iron deficiency anemia, diastolic CHF, DM, NASH, hiatal hernia, chronic thrombocytopenia who presents with possible UTI and dyspnea on exertion likely secondary to COPD exacerbation.   Wt Readings from Last 15 Encounters:  11/29/15 217 lb 11.2 oz (98.748 kg)  11/21/15 224 lb 4 oz (101.719 kg)  11/14/15 219 lb (99.338 kg)  11/13/15 221 lb 8 oz (100.472 kg)  11/06/15 221 lb 14.4 oz (100.653 kg)  10/27/15 229 lb 12.8 oz (104.237 kg)  10/17/15 224 lb (101.606 kg)  10/16/15 224 lb 1.9 oz (101.66 kg)  10/13/15 222 lb (100.699 kg)  10/05/15 232 lb (105.235 kg)  09/26/15 226 lb 4 oz (102.626 kg)  09/15/15 229 lb (103.874 kg)  09/14/15 225 lb (102.059 kg)  09/12/15 225 lb 12 oz (102.4 kg)  09/06/15 223 lb (101.152 kg)    Body mass index is 39.81 kg/(m^2). Patient meets criteria for Obesity based on current BMI. Pt asleep at time of visit x 2 attempts today. She appears well-nourished.   Current diet order is Heart Healthy/Carb Modified, patient is consuming approximately 75-100% of all meals at this time per nursing notes. Labs and medications reviewed.   No nutrition interventions determined at this time; pt asleep. She is eating well. RD will follow-up to re-assess any nutrition education needs.   Scarlette Ar RD, LDN Inpatient Clinical Dietitian Pager: [phone] After Hours Pager: [phone]

## 2015-11-29 NOTE — Progress Notes (Signed)
PATIENT DETAILS Name: [name] Age: 75 y.o. Sex: female Date of Birth: Dec 29, 1940 Admit Date: 11/28/2015 Admitting Physician [name] Dickens, MD [id number] Gutierrez, MD  Subjective: Feels much better.  Assessment/Plan: Active Problems: COPD exacerbation: Significantly improved, lungs are clear on exam. Stop Solu-Medrol, start prednisone, continue bronchodilators, empiric levofloxacin. Influenza PCR negative.  Chronic diastolic heart failure: Appears clinically compensated, weight appears close to her dry weight. Continue Demadex.  Uncontrolled diabetes: Likely secondary to steroids, steroids being rapidly tapered down-change SSI to resistant scale, add 8 units of NovoLog pre-meal.  Chronic hypoxic respiratory failure: Continue home O2-on 3 L at home.  Chronic kidney disease stage III: Close to usual baseline. Follow.  Pancytopenia: Secondary to cirrhosis. Follow periodically.  History of GAVE and small bowel AVMs: Likely causing chronic anemia. Hemoglobin stable.  History of liver cirrhosis: Secondary to [name]: Continue diuretics, lactulose and rifaximin.  Dyslipidemia: Statin.  History of depression/anxiety: Stable, without any suicidal or homicidal ideations. Continue Seroquel, Pristiq, Wellbutrin  Palliative care: 75 year old unfortunate female with history of cirrhosis, COPD, chronic diastolic heart failure pancytopenia, chronic GI bleeding from GAVE/AVM's-multiple hospitalizations recently for similar issues-with shortness of breath which I believe is mostly multifactorial from COPD/CHF and anemia. DNR/DNI, had prolonged palliative care discussion last admission, being followed at home by hospice  Disposition: Remain inpatient-Home 4/6  Antimicrobial agents  See below  Anti-infectives    Start     Dose/Rate Route Frequency Ordered Stop   11/28/15 1145  levofloxacin (LEVAQUIN) IVPB 750 mg     750 mg 100 mL/hr over 90 Minutes Intravenous Every  24 hours 11/28/15 1139     11/28/15 1015  ciprofloxacin (CIPRO) IVPB 400 mg     400 mg 200 mL/hr over 60 Minutes Intravenous  Once 11/28/15 1009 11/28/15 1246      DVT Prophylaxis: SCD's  Code Status:  DNR  Family Communication None at bedside  Procedures: None  CONSULTS:  None  Time spent 30 minutes-Greater than 50% of this time was spent in counseling, explanation of diagnosis, planning of further management, and coordination of care.  MEDICATIONS: Scheduled Meds: . buPROPion  150 mg Oral Daily  . ferrous sulfate  325 mg Oral Q breakfast  . guaiFENesin  600 mg Oral BID  . insulin aspart  0-15 Units Subcutaneous TID WC  . insulin aspart  25 Units Subcutaneous Once  . insulin glargine  120 Units Subcutaneous Daily  . ipratropium-albuterol  3 mL Nebulization Q4H  . lactulose  20 g Oral TID  . levofloxacin (LEVAQUIN) IV  750 mg Intravenous Q24H  . loratadine  10 mg Oral Daily  . pantoprazole  40 mg Oral BID  . potassium chloride SA  40 mEq Oral Daily  . QUEtiapine  200 mg Oral QHS  . rOPINIRole  4 mg Oral QHS  . simvastatin  40 mg Oral q1800  . torsemide  40 mg Oral Daily  . venlafaxine XR  37.5 mg Oral Q breakfast   Continuous Infusions:  PRN Meds:.ALPRAZolam, ipratropium-albuterol, ondansetron **OR** ondansetron (ZOFRAN) IV, oxyCODONE, promethazine    PHYSICAL EXAM: Vital signs in last 24 hours: Filed Vitals:   11/28/15 2055 11/29/15 0516 11/29/15 0647 11/29/15 0830  BP: 147/50 147/47    Pulse: 64 64    Temp: 98.3 F (36.8 C) 98.1 F (36.7 C)    TempSrc: Oral Oral    Resp: 20 20    Height:  Weight:  98.748 kg (217 lb 11.2 oz)    SpO2: 94% 100% 99% 96%    Weight change: 0.907 kg (2 lb) Filed Weights   11/28/15 0524 11/28/15 1618 11/29/15 0516  Weight: 99.338 kg (219 lb) 100.245 kg (221 lb) 98.748 kg (217 lb 11.2 oz)   Body mass index is 39.81 kg/(m^2).   Gen Exam: Awake and alert with clear speech.  Neck: Supple, No JVD.   Chest: B/L  Clear.   CVS: S1 S2 Regular, no murmurs.  Abdomen: soft, BS +, non tender, non distended.  Extremities: no edema, lower extremities warm to touch. Neurologic: Non Focal.   Skin: No Rash.   Wounds: N/A.    Intake/Output from previous day:  Intake/Output Summary (Last 24 hours) at 11/29/15 1259 Last data filed at 11/29/15 1026  Gross per 24 hour  Intake    655 ml  Output   1150 ml  Net   -495 ml     LAB RESULTS: CBC  Recent Labs Lab 11/28/15 0603 11/29/15 0249  WBC 5.4 3.9*  HGB 8.9* 8.1*  HCT 28.5* 26.3*  PLT 82* 71*  MCV 97.3 97.4  MCH 30.4 30.0  MCHC 31.2 30.8  RDW 16.7* 16.4*  LYMPHSABS 0.2*  --   MONOABS 0.5  --   EOSABS 0.2  --   BASOSABS 0.0  --     Chemistries   Recent Labs Lab 11/28/15 0603 11/29/15 0249  NA 139 137  K 3.7 4.9  CL 99* 100*  CO2 27 26  GLUCOSE 193* 392*  BUN 33* 35*  CREATININE 1.11* 1.25*  CALCIUM 9.4 9.1    CBG:  Recent Labs Lab 11/28/15 1220 11/28/15 1634 11/28/15 2100 11/29/15 0622 11/29/15 1129  GLUCAP 167* 382* 399* 470* 426*    GFR Estimated Creatinine Clearance: 44.6 mL/min (by C-G formula based on Cr of 1.25).  Coagulation profile No results for input(s): INR, PROTIME in the last 168 hours.  Cardiac Enzymes  Recent Labs Lab 11/28/15 0913 11/28/15 1636 11/28/15 2243  TROPONINI 0.06* 0.05* 0.04*    Invalid input(s): POCBNP No results for input(s): DDIMER in the last 72 hours.  Recent Labs  11/28/15 1636  HGBA1C 6.9*   No results for input(s): CHOL, HDL, LDLCALC, TRIG, CHOLHDL, LDLDIRECT in the last 72 hours. No results for input(s): TSH, T4TOTAL, T3FREE, THYROIDAB in the last 72 hours.  Invalid input(s): FREET3 No results for input(s): VITAMINB12, FOLATE, FERRITIN, TIBC, IRON, RETICCTPCT in the last 72 hours. No results for input(s): LIPASE, AMYLASE in the last 72 hours.  Urine Studies No results for input(s): UHGB, CRYS in the last 72 hours.  Invalid input(s): UACOL, UAPR, USPG, UPH,  UTP, UGL, UKET, UBIL, UNIT, UROB, ULEU, UEPI, UWBC, URBC, UBAC, CAST, UCOM, BILUA  MICROBIOLOGY: Recent Results (from the past 240 hour(s))  Urine culture     Status: None   Collection Time: 11/28/15  6:41 AM  Result Value Ref Range Status   Specimen Description URINE, RANDOM  Final   Special Requests NONE  Final   Culture MULTIPLE SPECIES PRESENT, SUGGEST RECOLLECTION  Final   Report Status 11/29/2015 FINAL  Final    RADIOLOGY STUDIES/RESULTS: Dg Chest 2 View  11/28/2015  CLINICAL DATA:  Shortness of breath. EXAM: CHEST  2 VIEW COMPARISON:  10/2015.  08/28/2015. FINDINGS: Mediastinum hilar structures normal. Cardiomegaly. No pulmonary vascular prominence. Mild left mid lung field and right base subsegmental atelectasis. No pleural effusion or pneumothorax. IMPRESSION: 1. Stable cardiomegaly.  No pulmonary  venous congestion. 2. Mild left mid lung field and right base subsegmental atelectasis. Electronically Signed   By: Marcello Moores  Register   On: 11/28/2015 07:08   Dg Chest 2 View  11/01/2015  CLINICAL DATA:  Shortness of Breath for 4 days, worsening.  Cough. EXAM: CHEST  2 VIEW COMPARISON:  08/28/2015 FINDINGS: Cardiomegaly with vascular congestion. Linear subsegmental atelectasis or scarring at the right lung base. No confluent opacity on the left. No effusions. No acute bony abnormality. Mild peribronchial thickening. IMPRESSION: Cardiomegaly, vascular congestion and mild bronchitic changes. Right base atelectasis. Electronically Signed   By: Rolm Baptise M.D.   On: 11/01/2015 13:46    Oren Binet, MD  [facility] Pager:[phone]  If 7PM-7AM, please contact night-coverage [email or web address] Password [facility] 11/29/2015, 12:59 PM

## 2015-11-30 ENCOUNTER — Other Ambulatory Visit: Payer: Self-pay | Admitting: *Deleted

## 2015-11-30 DIAGNOSIS — I5032 Chronic diastolic (congestive) heart failure: Secondary | ICD-10-CM

## 2015-11-30 DIAGNOSIS — J9611 Chronic respiratory failure with hypoxia: Secondary | ICD-10-CM

## 2015-11-30 DIAGNOSIS — J441 Chronic obstructive pulmonary disease with (acute) exacerbation: Secondary | ICD-10-CM | POA: Diagnosis not present

## 2015-11-30 DIAGNOSIS — K7460 Unspecified cirrhosis of liver: Secondary | ICD-10-CM | POA: Diagnosis not present

## 2015-11-30 LAB — GLUCOSE, CAPILLARY
GLUCOSE-CAPILLARY: 363 mg/dL — AB (ref 65–99)
Glucose-Capillary: 289 mg/dL — ABNORMAL HIGH (ref 65–99)

## 2015-11-30 MED ORDER — IPRATROPIUM-ALBUTEROL 0.5-2.5 (3) MG/3ML IN SOLN
3.0000 mL | Freq: Four times a day (QID) | RESPIRATORY_TRACT | Status: DC
  Filled 2015-11-30: qty 3

## 2015-11-30 MED ORDER — PREDNISONE 5 MG PO TABS: ORAL_TABLET | ORAL | Status: DC

## 2015-11-30 MED ORDER — LEVOFLOXACIN 500 MG PO TABS
500.0000 mg | ORAL_TABLET | Freq: Every day | ORAL | Status: DC
Start: 2015-12-01 — End: 2015-12-11

## 2015-11-30 NOTE — Discharge Summary (Signed)
PATIENT DETAILS Name: [name] Age: 75 y.o. Sex: female Date of Birth: 1940-09-29 MRN: [id number]. Admitting Physician: Waldemar Dickens, MD [id number] Danise Mina, MD  Admit Date: 11/28/2015 Discharge date: 11/30/2015  Recommendations for Outpatient Follow-up:  1. Resume/Initiate home hospice on discharge.   PRIMARY DISCHARGE DIAGNOSIS:  Active Problems:   COPD exacerbation ([place])   Dyspnea      PAST MEDICAL HISTORY: Past Medical History  Diagnosis Date  . Chronic airway obstruction, not elsewhere classified   . Unspecified chronic bronchitis ([place])   . Unspecified essential hypertension   . Non-obstructive CAD   . Palpitations   . Mild aortic stenosis     a. 03/2014 Valve area (VTI): 2.89 cm^2, Valve area (Vmax): 2.7 cm^2.  . Pure hypercholesterolemia   . Morbid obesity ([place])   . Esophageal reflux   . GAVE (gastric antral vascular ectasia)     angiodysplasia; s/p multiple APCs, discussing RFA with [facility] GI Dr Newman Pies (06/2015)  . Diverticulosis of colon (without mention of hemorrhage)   . Benign neoplasm of colon   . Osteoarthrosis, unspecified whether generalized or localized, unspecified site   . Osteoporosis, unspecified   . Restless legs syndrome (RLS)   . Major depressive disorder, recurrent episode, severe, specified as with psychotic behavior   . Iron deficiency anemia secondary to blood loss (chronic)     a. frequent PRBC transfusions.  . Coarse tremors     a. arms.  . Carpal tunnel syndrome on both sides   . Chronic diastolic CHF (congestive heart failure) ([place])     a. 03/26/2014 Echo: EF 55-60%, no rwma, mild AS/AI, mod-sev Ca2+ MV annulus, mildly to mod dil LA.  [name] Asthma   . Type II diabetes mellitus ([place])   . H/O hiatal hernia   . 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 dx'd 1990's  . Adenomatous colon polyp   . Midsternal chest pain     a. conservatively managed ->poor candidate for cath/anticoagulation.  [name]  Anxiety   . Portal hypertensive gastropathy   . Falls frequently     completed HHPT/OT 06/2014, PT unmet goals  . Recurrent UTI (urinary tract infection)     h/o hospitalization with urosepsis 2015, but thought large component colonization/bacteriuria, only treat if symptomatic ([place])  . Hiatal hernia   . Thrombocytopenia ([place])   . Protein calorie malnutrition ([place])   . On home oxygen therapy     "3L; 24/7" (11/29/2015)  . OSA (obstructive sleep apnea)     a. does not use CPAP. (01/25/2015)  . Hepatitis   . CAP (community acquired pneumonia) 2015    DISCHARGE MEDICATIONS: Current Discharge Medication List    START taking these medications   Details  levofloxacin (LEVAQUIN) 500 MG tablet Take 1 tablet (500 mg total) by mouth daily. Qty: 2 tablet, Refills: 0    predniSONE (DELTASONE) 5 MG tablet Take 4 tablets daily for 1 day, Take 2 tablets daily for 1 day, and then stop Qty: 6 tablet, Refills: 0      CONTINUE these medications which have NOT CHANGED   Details  ADVAIR DISKUS 250-50 MCG/DOSE AEPB INHALE 1 PUFF INTO LUNGS TWICE A DAY Qty: 60 each, Refills: 11    albuterol (PROAIR HFA) 108 (90 BASE) MCG/ACT inhaler Inhale 2 puffs into the lungs every 6 (six) hours as needed for wheezing or shortness of breath. Reported on 08/18/2015    ALPRAZolam (XANAX) 1 MG tablet Take 0.5-1 tablets (  0.5-1 mg total) by mouth 2 (two) times daily as needed for anxiety or sleep. Qty: 60 tablet, Refills: 0    B Complex-C ([employer] B COMPLEX PLUS C) TABS TAKE ONE CAPSULE BY MOUTH EVERY DAY Qty: 30 tablet, Refills: 6    buPROPion (WELLBUTRIN XL) 150 MG 24 hr tablet Take 150 mg by mouth daily.    desvenlafaxine (PRISTIQ) 50 MG 24 hr tablet Take 3 tablets (150 mg total) by mouth daily. Qty: 90 tablet, Refills: 11    ferrous sulfate 325 (65 FE) MG tablet Take 1 tablet (325 mg total) by mouth daily with breakfast. Qty: 90 tablet, Refills: 1    fluconazole (DIFLUCAN) 150 MG tablet Take 1 tablet (150 mg  total) by mouth once. may repeat in 4 days if needed Qty: 2 tablet, Refills: 0    glucose blood (ONE TOUCH ULTRA TEST) test strip Check blood sugar 4 times daily Qty: 200 each, Refills: 3    guaiFENesin (MUCINEX) 600 MG 12 hr tablet Take 600 mg by mouth 2 (two) times daily.     Insulin Glargine (LANTUS SOLOSTAR) 100 UNIT/ML Solostar Pen Inject 120 Units into the skin every morning. Qty: 20 pen, Refills: 11    insulin lispro (HUMALOG KWIKPEN) 100 UNIT/ML KiwkPen Inject 10-25 Units into the skin daily with supper.     Insulin Pen Needle (B-D ULTRAFINE III SHORT PEN) 31G X 8 MM MISC Use as directed to inject insulin twice daily. Dx: E11.8 Qty: 200 each, Refills: 2    KLOR-CON M20 20 MEQ tablet TAKE 2 TABLETS EVERY DAY Qty: 30 tablet, Refills: 6    lactulose, encephalopathy, (CHRONULAC) 10 GM/15ML SOLN Take 20 g by mouth 3 (three) times daily.     levalbuterol (XOPENEX) 0.63 MG/3ML nebulizer solution Take 0.63 mg by nebulization every 8 (eight) hours as needed for wheezing or shortness of breath.  Refills: 5    loratadine (CLARITIN) 10 MG tablet TAKE 1 TABLET BY MOUTH EVERY DAY Qty: 30 tablet, Refills: 11    metolazone (ZAROXOLYN) 2.5 MG tablet Take as directed for 4 lb weight gain Qty: 30 tablet, Refills: 0   Associated Diagnoses: Chronic diastolic heart failure (HCC)    Multiple Vitamins-Minerals (CENTRUM SILVER PO) Take 1 tablet by mouth daily.     mupirocin cream (BACTROBAN) 2 % Apply 1 application topically 2 (two) times daily. Reported on 11/14/2015    oxycodone (OXY-IR) 5 MG capsule Take 1 capsule (5 mg total) by mouth every 6 (six) hours as needed for pain. Qty: 60 capsule, Refills: 0    pantoprazole (PROTONIX) 40 MG tablet TAKE 1 TABLET BY MOUTH TWICE A DAY Qty: 60 tablet, Refills: 11    promethazine (PHENERGAN) 25 MG tablet Take 25 mg by mouth 2 (two) times daily as needed for nausea or vomiting.    QUEtiapine (SEROQUEL) 200 MG tablet TAKE 1 TABLET BY MOUTH AT  BEDTIME Qty: 30 tablet, Refills: 0    rifaximin (XIFAXAN) 550 MG TABS tablet Take 550 mg by mouth 2 (two) times daily. Reported on 10/17/2015    rOPINIRole (REQUIP) 4 MG tablet Take 4 mg by mouth at bedtime.    simvastatin (ZOCOR) 40 MG tablet Take 40 mg by mouth daily.    torsemide (DEMADEX) 20 MG tablet Take 2 tablets (40 mg total) by mouth daily. Qty: 60 tablet, Refills: 0    Vitamin D, Ergocalciferol, (DRISDOL) 50000 units CAPS capsule TAKE ONE CAPSULE WEEKLY Qty: 4 capsule, Refills: 6  ALLERGIES:   Allergies  Allergen Reactions  . Acetaminophen Other (See Comments)    Liver dysfunction  . Nsaids Other (See Comments)    Liver dysfunction   . Aspirin Other (See Comments)    Causes nosebleeds  . Theophylline Nausea And Vomiting  . Penicillin G Itching  . Penicillins Itching    Has patient had a PCN reaction causing immediate rash, facial/tongue/throat swelling, SOB or lightheadedness with hypotension: unknown Has patient had a PCN reaction causing severe rash involving mucus membranes or skin necrosis: unknown Has patient had a PCN reaction that required hospitalization unknown Has patient had a PCN reaction occurring within the last 10 years: yes If all of the above answers are "NO", then may proceed with Cephalosporin use.     BRIEF HPI:  See H&P, Labs, Consult and Test reports for all details in brief, patient is a 75 y.o. female with a Past Medical History of COPD, nonobstructive CAD, aortic stenosis, HLD, GA VE syndrome, RLS, depression, iron deficiency anemia, diastolic CHF, DM, [place], hiatal hernia, chronic thrombocytopenia who presented dyspnea on exertion likely secondary to COPD exacerbation CONSULTATIONS:   None  PERTINENT RADIOLOGIC STUDIES: Dg Chest 2 View  11/28/2015  CLINICAL DATA:  Shortness of breath. EXAM: CHEST  2 VIEW COMPARISON:  10/2015.  08/28/2015. FINDINGS: Mediastinum hilar structures normal. Cardiomegaly. No pulmonary vascular prominence.  Mild left mid lung field and right base subsegmental atelectasis. No pleural effusion or pneumothorax. IMPRESSION: 1. Stable cardiomegaly.  No pulmonary venous congestion. 2. Mild left mid lung field and right base subsegmental atelectasis. Electronically Signed   By: Marcello Moores  Register   On: 11/28/2015 07:08   Dg Chest 2 View  11/01/2015  CLINICAL DATA:  Shortness of Breath for 4 days, worsening.  Cough. EXAM: CHEST  2 VIEW COMPARISON:  08/28/2015 FINDINGS: Cardiomegaly with vascular congestion. Linear subsegmental atelectasis or scarring at the right lung base. No confluent opacity on the left. No effusions. No acute bony abnormality. Mild peribronchial thickening. IMPRESSION: Cardiomegaly, vascular congestion and mild bronchitic changes. Right base atelectasis. Electronically Signed   By: Rolm Baptise M.D.   On: 11/01/2015 13:46     PERTINENT LAB RESULTS: CBC:  Recent Labs  11/28/15 0603 11/29/15 0249  WBC 5.4 3.9*  HGB 8.9* 8.1*  HCT 28.5* 26.3*  PLT 82* 71*   CMET CMP     Component Value Date/Time   NA 137 11/29/2015 0249   NA 136 02/21/2015   K 4.9 11/29/2015 0249   K 4.0 02/21/2015   CL 100* 11/29/2015 0249   CO2 26 11/29/2015 0249   GLUCOSE 392* 11/29/2015 0249   GLUCOSE 256* 09/04/2006 1603   BUN 35* 11/29/2015 0249   BUN 78* 02/21/2015   CREATININE 1.25* 11/29/2015 0249   CREATININE 1.07* 10/16/2015 1105   CALCIUM 9.1 11/29/2015 0249   PROT 5.7* 11/13/2015 1628   ALBUMIN 3.3* 11/21/2015 1335   AST 45* 11/13/2015 1628   ALT 35 11/13/2015 1628   ALKPHOS 110 11/13/2015 1628   BILITOT 1.0 11/13/2015 1628   GFRNONAA 42* 11/29/2015 0249   GFRAA 49* 11/29/2015 0249    GFR Estimated Creatinine Clearance: 44.6 mL/min (by C-G formula based on Cr of 1.25). No results for input(s): LIPASE, AMYLASE in the last 72 hours.  Recent Labs  11/28/15 0913 11/28/15 1636 11/28/15 2243  TROPONINI 0.06* 0.05* 0.04*   Invalid input(s): POCBNP No results for input(s): DDIMER in  the last 72 hours.  Recent Labs  11/28/15 1636  HGBA1C 6.9*  No results for input(s): CHOL, HDL, LDLCALC, TRIG, CHOLHDL, LDLDIRECT in the last 72 hours. No results for input(s): TSH, T4TOTAL, T3FREE, THYROIDAB in the last 72 hours.  Invalid input(s): FREET3 No results for input(s): VITAMINB12, FOLATE, FERRITIN, TIBC, IRON, RETICCTPCT in the last 72 hours. Coags: No results for input(s): INR in the last 72 hours.  Invalid input(s): PT Microbiology: Recent Results (from the past 240 hour(s))  Urine culture     Status: None   Collection Time: 11/28/15  6:41 AM  Result Value Ref Range Status   Specimen Description URINE, RANDOM  Final   Special Requests NONE  Final   Culture MULTIPLE SPECIES PRESENT, SUGGEST RECOLLECTION  Final   Report Status 11/29/2015 FINAL  Final     BRIEF HOSPITAL COURSE:  COPD exacerbation: Significantly improved, lungs are clear on exam. Initially on solu-Medrol then transitioned to prednisone-will plan a quick taper. Continue bronchodilator regimen on disccharge,Continue empiric levofloxacin for 2 more days. Influenza PCR negative.  Chronic diastolic heart failure: Appears clinically compensated, weight appears close to her dry weight. Weight 217 on discharge. Continue Demadex.  Uncontrolled diabetes: Likely secondary to steroids, steroids being rapidly tapered down-suspect sugars will get better soon-will continue usual home regimen of insulin on discharge.  Chronic hypoxic respiratory failure: Continue home O2-on 3 L at home.  Chronic kidney disease stage III: Close to usual baseline. Follow.  Pancytopenia: Secondary to cirrhosis. Follow periodically.  History of GAVE and small bowel AVMs: Likely causing chronic anemia. Hemoglobin stable.  History of liver cirrhosis: Secondary to [name]: Continue diuretics, lactulose and rifaximin.  Dyslipidemia: Statin.  Peripheral neuropathy/chronic pain:Continues to pain in the lower extremities-suspect  peripheral neuropathy secondary to diabetes. Refused Lyrica last admit-continue oxycodone  History of depression/anxiety: Stable, without any suicidal or homicidal ideations. Continue Seroquel, Pristiq, Wellbutrin  Palliative care: 75 year old unfortunate female with history of cirrhosis, COPD, chronic diastolic heart failure pancytopenia, chronic GI bleeding from GAVE/AVM's-multiple hospitalizations recently for similar issues-with shortness of breath which I believe is mostly multifactorial from COPD/CHF and anemia. DNR/DNI, had prolonged palliative care discussion last admission, being followed at home by hospice  TODAY-DAY OF DISCHARGE:  Subjective:   [name] today has no headache,no chest abdominal pain,no new weakness tingling or numbness, feels much better wants to go home today.  Objective:   Blood pressure 150/48, pulse 61, temperature 98.3 F (36.8 C), temperature source Oral, resp. rate 16, height 5\' 2"  (1.575 m), weight 98.476 kg (217 lb 1.6 oz), SpO2 98 %.  Intake/Output Summary (Last 24 hours) at 11/30/15 0929 Last data filed at 11/30/15 0929  Gross per 24 hour  Intake   3919 ml  Output   3175 ml  Net    744 ml   Filed Weights   11/28/15 1618 11/29/15 0516 11/30/15 0528  Weight: 100.245 kg (221 lb) 98.748 kg (217 lb 11.2 oz) 98.476 kg (217 lb 1.6 oz)    Exam Awake Alert, Oriented *3, No new F.N deficits, Normal affect [place].AT,PERRAL Supple Neck,No JVD, No cervical lymphadenopathy appriciated.  Symmetrical Chest wall movement, Good air movement bilaterally, CTAB RRR,No Gallops,Rubs or new Murmurs, No Parasternal Heave +ve B.Sounds, Abd Soft, Non tender, No organomegaly appriciated, No rebound -guarding or rigidity. No Cyanosis, Clubbing or edema, No new Rash or bruise  DISCHARGE CONDITION: Stable  DISPOSITION: Home   DISCHARGE INSTRUCTIONS:    Activity:  As tolerated with Full fall precautions use walker/cane & assistance as needed  Get Medicines  reviewed and adjusted: Please take all your  medications with you for your next visit with your Primary MD  Please request your Primary MD to go over all hospital tests and procedure/radiological results at the follow up, please ask your Primary MD to get all Hospital records sent to his/her office.  If you experience worsening of your admission symptoms, develop shortness of breath, life threatening emergency, suicidal or homicidal thoughts you must seek medical attention immediately by calling 911 or calling your MD immediately  if symptoms less severe.  You must read complete instructions/literature along with all the possible adverse reactions/side effects for all the Medicines you take and that have been prescribed to you. Take any new Medicines after you have completely understood and accpet all the possible adverse reactions/side effects.   Do not drive when taking Pain medications.   Do not take more than prescribed Pain, Sleep and Anxiety Medications  Special Instructions: If you have smoked or chewed Tobacco  in the last 2 yrs please stop smoking, stop any regular Alcohol  and or any Recreational drug use.  Wear Seat belts while driving.  Please note  You were cared for by a hospitalist during your hospital stay. Once you are discharged, your primary care physician will handle any further medical issues. Please note that NO REFILLS for any discharge medications will be authorized once you are discharged, as it is imperative that you return to your primary care physician (or establish a relationship with a primary care physician if you do not have one) for your aftercare needs so that they can reassess your need for medications and monitor your lab values.   Diet recommendation: Diabetic Diet Heart Healthy diet  Discharge Instructions    (HEART FAILURE PATIENTS) Call MD:  Anytime you have any of the following symptoms: 1) 3 pound weight gain in 24 hours or 5 pounds in 1 week 2)  shortness of breath, with or without a dry hacking cough 3) swelling in the hands, feet or stomach 4) if you have to sleep on extra pillows at night in order to breathe.    Complete by:  As directed      Diet - low sodium heart healthy    Complete by:  As directed      Diet Carb Modified    Complete by:  As directed      Increase activity slowly    Complete by:  As directed            Follow-up Information    Follow up with Ria Bush, MD On 12/11/2015.   Specialty:  Family Medicine   Why:  Hospital follow up at 2:30pm   Contact information:   [place] [address] [phone]      Total Time spent on discharge equals  45 minutes.  SignedOren Binet 11/30/2015 9:29 AM

## 2015-11-30 NOTE — Patient Outreach (Signed)
[facility]) Care Management  11/30/2015  [name] May 31, 1941 [id number]   Inpatient visit in lieu of home visit which was scheduled for today.  Pt. reports she got in trouble over the weekend. She did call and get advise from [facility] and their advise did help her relax and she was able to go back to sleep but awakened later being very dyspneic and ansious and she called 911. She has been being treated for a COPD exacerbation and she tells me its also something wrong with her heart. She is to be sent home today. She will be on the [facility] case load now and I will make one more home visit to see her for discharge and make a smooth transition with our partner.   Deloria Lair [facility] [phone]

## 2015-11-30 NOTE — Consult Note (Signed)
   [facility] CM Inpatient Consult   11/30/2015  DEZIRAY NABI 08-02-1941 [id number] Patient is currently active with [facility] for chronic disease management services.  Patient has been engaged by a [employer]. Met with the patient at bedside.  Patient also was active with [facility].  Patient has recently been referred to the [place] program which started prior to her hospital stay.  Our community based plan of care has focused on disease management and community resource support.  Patient will receive a post discharge transition of care call and will be evaluated for monthly home visits for assessments and disease process education.  Made Inpatient Case Manager aware that [facility] following. Of note, [facility] Care Management services does not replace or interfere with any services that are needed or arranged by inpatient case management or social work.  For additional questions or referrals please contact: Natividad Brood, RN BSN [facility] Liaison  [phone] business mobile phone Toll free office [phone]

## 2015-11-30 NOTE — Progress Notes (Signed)
Patient is active with [facility] and PT as prior to admission; Aneta Mins [phone]

## 2015-11-30 NOTE — Progress Notes (Signed)
PT stated that she take treatments 2x daily   PT was assessed and treatments adjusted

## 2015-11-30 NOTE — Progress Notes (Signed)
Orders received for pt discharge.  Discharge summary printed and reviewed with pt.  Explained medication regimen, and pt had no further questions at this time.  IV removed and site remains clean, dry, intact.  Telemetry removed.  Pt in stable condition and awaiting transport. 

## 2015-11-30 NOTE — Telephone Encounter (Signed)
Pt is calling back to get some results to some test.

## 2015-11-30 NOTE — Telephone Encounter (Signed)
**Note De-Identified  Obfuscation** The pt has been given her Troponin levels from 11/28/15. She verbalized understanding.

## 2015-12-02 ENCOUNTER — Other Ambulatory Visit: Payer: Self-pay | Admitting: Cardiovascular Disease

## 2015-12-03 DIAGNOSIS — J449 Chronic obstructive pulmonary disease, unspecified: Secondary | ICD-10-CM | POA: Diagnosis not present

## 2015-12-04 ENCOUNTER — Telehealth: Payer: Self-pay

## 2015-12-04 NOTE — Telephone Encounter (Signed)
Initial TCM call  Transition Care Management Follow-up Telephone Call     Date discharged? 11/30/2015   How have you been since you were released from the hospital? Stable   Any patient concerns? None at this time   Do you understand why you were in the hospital? Yes   Do you understand the discharge instructions? Yes   Where were you discharged to? Home   Items Reviewed:  Medications reviewed: Yes  Allergies reviewed: Yes  Dietary changes reviewed: No  Referrals reviewed: N/A   Functional Questionnaire:  Independent - I Dependent - D    Activities of Daily Living (ADLs):  Independent with ADLs; assistance requested as needed  Personal hygiene  Dressing Eating  Maintaining continence Transferring  Independent Activities of Daily Living (ADLs): Basic communication skills - I Transportation - D Meal preparation -D  Shopping- D Housework -D  Managing medications - I Managing personal finances - I   Confirmed importance and date/time of follow-up visits scheduled YES  Provider Appointment booked with PCP 12/11/15  Confirmed with patient if condition begins to worsen call PCP or go to the ER.  Patient was given the office number and encouraged to call back with question or concerns: YES

## 2015-12-05 ENCOUNTER — Telehealth: Payer: Self-pay | Admitting: Family Medicine

## 2015-12-05 NOTE — Telephone Encounter (Signed)
[name] brady from [facility] called wanting home health nursing orders for disease management and med management  3 week 1 2 week 8 2 prn  For copd exaccerbatuion  cb number is [phone]

## 2015-12-05 NOTE — Telephone Encounter (Signed)
Ok to do thanks. 

## 2015-12-06 ENCOUNTER — Telehealth: Payer: Self-pay

## 2015-12-06 DIAGNOSIS — I350 Nonrheumatic aortic (valve) stenosis: Secondary | ICD-10-CM | POA: Diagnosis not present

## 2015-12-06 DIAGNOSIS — K7581 Nonalcoholic steatohepatitis (NASH): Secondary | ICD-10-CM | POA: Diagnosis not present

## 2015-12-06 DIAGNOSIS — I5032 Chronic diastolic (congestive) heart failure: Secondary | ICD-10-CM | POA: Diagnosis not present

## 2015-12-06 DIAGNOSIS — D61818 Other pancytopenia: Secondary | ICD-10-CM | POA: Diagnosis not present

## 2015-12-06 DIAGNOSIS — J9611 Chronic respiratory failure with hypoxia: Secondary | ICD-10-CM | POA: Diagnosis not present

## 2015-12-06 DIAGNOSIS — I2510 Atherosclerotic heart disease of native coronary artery without angina pectoris: Secondary | ICD-10-CM | POA: Diagnosis not present

## 2015-12-06 DIAGNOSIS — J441 Chronic obstructive pulmonary disease with (acute) exacerbation: Secondary | ICD-10-CM | POA: Diagnosis not present

## 2015-12-06 DIAGNOSIS — K7469 Other cirrhosis of liver: Secondary | ICD-10-CM | POA: Diagnosis not present

## 2015-12-06 DIAGNOSIS — E1165 Type 2 diabetes mellitus with hyperglycemia: Secondary | ICD-10-CM | POA: Diagnosis not present

## 2015-12-06 DIAGNOSIS — E1122 Type 2 diabetes mellitus with diabetic chronic kidney disease: Secondary | ICD-10-CM | POA: Diagnosis not present

## 2015-12-06 DIAGNOSIS — I130 Hypertensive heart and chronic kidney disease with heart failure and stage 1 through stage 4 chronic kidney disease, or unspecified chronic kidney disease: Secondary | ICD-10-CM | POA: Diagnosis not present

## 2015-12-06 DIAGNOSIS — E1142 Type 2 diabetes mellitus with diabetic polyneuropathy: Secondary | ICD-10-CM | POA: Diagnosis not present

## 2015-12-06 DIAGNOSIS — N183 Chronic kidney disease, stage 3 (moderate): Secondary | ICD-10-CM | POA: Diagnosis not present

## 2015-12-06 NOTE — Telephone Encounter (Signed)
[name] PT with [facility] left v/m requesting verbal orders for home health PT  2 x a week for 6 weeks.

## 2015-12-06 NOTE — Telephone Encounter (Signed)
Ok to do thanks. 

## 2015-12-06 NOTE — Telephone Encounter (Signed)
Kristen at [facility] notified.

## 2015-12-07 ENCOUNTER — Encounter: Payer: Self-pay | Admitting: *Deleted

## 2015-12-07 ENCOUNTER — Other Ambulatory Visit: Payer: Self-pay | Admitting: *Deleted

## 2015-12-07 ENCOUNTER — Telehealth: Payer: Self-pay

## 2015-12-07 DIAGNOSIS — I130 Hypertensive heart and chronic kidney disease with heart failure and stage 1 through stage 4 chronic kidney disease, or unspecified chronic kidney disease: Secondary | ICD-10-CM | POA: Diagnosis not present

## 2015-12-07 DIAGNOSIS — D61818 Other pancytopenia: Secondary | ICD-10-CM | POA: Diagnosis not present

## 2015-12-07 DIAGNOSIS — E1142 Type 2 diabetes mellitus with diabetic polyneuropathy: Secondary | ICD-10-CM | POA: Diagnosis not present

## 2015-12-07 DIAGNOSIS — K7581 Nonalcoholic steatohepatitis (NASH): Secondary | ICD-10-CM | POA: Diagnosis not present

## 2015-12-07 DIAGNOSIS — I2510 Atherosclerotic heart disease of native coronary artery without angina pectoris: Secondary | ICD-10-CM | POA: Diagnosis not present

## 2015-12-07 DIAGNOSIS — I350 Nonrheumatic aortic (valve) stenosis: Secondary | ICD-10-CM | POA: Diagnosis not present

## 2015-12-07 DIAGNOSIS — J441 Chronic obstructive pulmonary disease with (acute) exacerbation: Secondary | ICD-10-CM | POA: Diagnosis not present

## 2015-12-07 DIAGNOSIS — E1122 Type 2 diabetes mellitus with diabetic chronic kidney disease: Secondary | ICD-10-CM | POA: Diagnosis not present

## 2015-12-07 DIAGNOSIS — J9611 Chronic respiratory failure with hypoxia: Secondary | ICD-10-CM | POA: Diagnosis not present

## 2015-12-07 DIAGNOSIS — N183 Chronic kidney disease, stage 3 (moderate): Secondary | ICD-10-CM | POA: Diagnosis not present

## 2015-12-07 DIAGNOSIS — I5032 Chronic diastolic (congestive) heart failure: Secondary | ICD-10-CM | POA: Diagnosis not present

## 2015-12-07 DIAGNOSIS — J449 Chronic obstructive pulmonary disease, unspecified: Secondary | ICD-10-CM | POA: Diagnosis not present

## 2015-12-07 DIAGNOSIS — E1165 Type 2 diabetes mellitus with hyperglycemia: Secondary | ICD-10-CM | POA: Diagnosis not present

## 2015-12-07 DIAGNOSIS — K7469 Other cirrhosis of liver: Secondary | ICD-10-CM | POA: Diagnosis not present

## 2015-12-07 NOTE — Telephone Encounter (Signed)
Message left notifying [name].

## 2015-12-07 NOTE — Telephone Encounter (Signed)
Ok to start doxycycline course.  Keep appt next week with me.  thanks

## 2015-12-07 NOTE — Telephone Encounter (Signed)
[name] with [facility] which is home based pallative care program; pt was seen today; pt was released from hospital last Thursday. 12/06/15 weight was 219 (previous weight was 214). On 12/06/15  pt took metolazone 30 mins prior to taking torsemide (pt was instructed to take metolazone if weight is up by 4 lbs.) This morning weight was 218; today fever was 99.4; pt coughing up green sputum, breath sounds are diminished throughout; pt feels like she has pneumonia; no acute respiratory distress; 0 2 sat on 3L of O2 was 97%. [name] spoke with medical director and order was given for pt to take additional order for torsemide today; f/u with pt on 12/08/15 about weight; medical director also said pt may need abx. Pt just finished levoquin on 12/02/15. Medical director is recommending doxycycline for 5 days; [name] wants to know if Dr Darnell Level wants [name] to do the medical directors orders or does Dr Darnell Level have other orders for pt. Pt has appt with DR G on 12/11/15.[name] request cb.

## 2015-12-07 NOTE — Telephone Encounter (Signed)
Erroneous encounter

## 2015-12-07 NOTE — Patient Outreach (Signed)
[place] [facility]) Care Management   12/07/2015  [name] [birth year] [id number]  [name] is an 75 y.o. female  Subjective:  Pt reports that her Palliative Care nurse was here earlier today. She had reported to her that she had green sputum and the nurse heard some congestion. She is no more SOB than usual. She has 1+ edema in her feet and lower extremities and has taken metalazone yesterday and today she took an increased dose of tosemide (80 mg). Her glucose levels have not been good. She denies pain today.  Objective:   Review of Systems  HENT: Negative.   Eyes: Negative.   Respiratory: Positive for cough and sputum production.   Cardiovascular: Positive for leg swelling.  Gastrointestinal: Negative.   Genitourinary: Negative.   Musculoskeletal: Positive for myalgias.  Skin: Positive for itching and rash.  Neurological: Positive for weakness.  Endo/Heme/Allergies: Bruises/bleeds easily.  Psychiatric/Behavioral: The patient is nervous/anxious.     BP 150/68 mmHg  Pulse 68  Resp 18  Wt 218 lb (98.884 kg)  SpO2 96%FBS 329   Physical Exam  Constitutional: She is oriented to person, place, and time. She appears well-developed and well-nourished.  HENT:  Head: Normocephalic.  Cardiovascular: Normal rate and regular rhythm.   Murmur heard. Respiratory: Effort normal and breath sounds normal.  GI: Soft. Bowel sounds are normal.  Musculoskeletal: She exhibits edema.  Slight edema 1+.  Neurological: She is alert and oriented to person, place, and time.  Skin: Skin is warm and dry. Rash noted.  Psychiatric: She has a normal mood and affect. Her behavior is normal. Judgment and thought content normal.    Encounter Medications:   Outpatient Encounter Prescriptions as of 12/07/2015  Medication Sig Note  . ADVAIR DISKUS 250-50 MCG/DOSE AEPB INHALE 1 PUFF INTO LUNGS TWICE A DAY   . albuterol (PROAIR HFA) 108 (90 BASE) MCG/ACT inhaler Inhale 2 puffs into the  lungs every 6 (six) hours as needed for wheezing or shortness of breath. Reported on 08/18/2015   . ALPRAZolam (XANAX) 1 MG tablet Take 0.5-1 tablets (0.5-1 mg total) by mouth 2 (two) times daily as needed for anxiety or sleep.   . B Complex-C ([facility] B COMPLEX PLUS C) TABS TAKE ONE CAPSULE BY MOUTH EVERY DAY   . buPROPion (WELLBUTRIN XL) 150 MG 24 hr tablet Take 150 mg by mouth daily.   [name] desvenlafaxine (PRISTIQ) 50 MG 24 hr tablet Take 3 tablets (150 mg total) by mouth daily.   . ferrous sulfate 325 (65 FE) MG tablet Take 1 tablet (325 mg total) by mouth daily with breakfast.   . fluconazole (DIFLUCAN) 150 MG tablet Take 1 tablet (150 mg total) by mouth once. may repeat in 4 days if needed (Patient taking differently: Take 150 mg by mouth See admin instructions. Take 1 tablet on day one, may repeat in 4 days if needed for infection)   . glucose blood (ONE TOUCH ULTRA TEST) test strip Check blood sugar 4 times daily   . guaiFENesin (MUCINEX) 600 MG 12 hr tablet Take 600 mg by mouth 2 (two) times daily.    . Insulin Glargine (LANTUS SOLOSTAR) 100 UNIT/ML Solostar Pen Inject 120 Units into the skin every morning.   . insulin lispro (HUMALOG KWIKPEN) 100 UNIT/ML KiwkPen Inject 10-25 Units into the skin daily with supper.    . Insulin Pen Needle (B-D ULTRAFINE III SHORT PEN) 31G X 8 MM MISC Use as directed to inject insulin twice daily. Dx:  E11.8   . KLOR-CON M20 20 MEQ tablet TAKE 2 TABLETS EVERY DAY   . lactulose, encephalopathy, (CHRONULAC) 10 GM/15ML SOLN Take 20 g by mouth 3 (three) times daily.    [name] levalbuterol (XOPENEX) 0.63 MG/3ML nebulizer solution Take 0.63 mg by nebulization every 8 (eight) hours as needed for wheezing or shortness of breath.    . levofloxacin (LEVAQUIN) 500 MG tablet Take 1 tablet (500 mg total) by mouth daily.   [name] loratadine (CLARITIN) 10 MG tablet TAKE 1 TABLET BY MOUTH EVERY DAY   . metolazone (ZAROXOLYN) 2.5 MG tablet TAKE AS DIRECTED FOR 4 LB WEIGHT GAIN   . Multiple  Vitamins-Minerals (CENTRUM SILVER PO) Take 1 tablet by mouth daily.    . mupirocin cream (BACTROBAN) 2 % Apply 1 application topically 2 (two) times daily. Reported on 11/14/2015   . oxycodone (OXY-IR) 5 MG capsule Take 1 capsule (5 mg total) by mouth every 6 (six) hours as needed for pain.   . pantoprazole (PROTONIX) 40 MG tablet TAKE 1 TABLET BY MOUTH TWICE A DAY   . promethazine (PHENERGAN) 25 MG tablet Take 25 mg by mouth 2 (two) times daily as needed for nausea or vomiting.   [name] QUEtiapine (SEROQUEL) 200 MG tablet TAKE 1 TABLET BY MOUTH AT BEDTIME   . rifaximin (XIFAXAN) 550 MG TABS tablet Take 550 mg by mouth 2 (two) times daily. Reported on 10/17/2015 11/14/2015: Approved for pharmacy assistance, waiting on med delivery.  [name] rOPINIRole (REQUIP) 4 MG tablet Take 4 mg by mouth at bedtime.   . simvastatin (ZOCOR) 40 MG tablet Take 40 mg by mouth daily.   [name] torsemide (DEMADEX) 20 MG tablet Take 2 tablets (40 mg total) by mouth daily.   . Vitamin D, Ergocalciferol, (DRISDOL) 50000 units CAPS capsule TAKE ONE CAPSULE WEEKLY (Patient taking differently: TAKE ONE CAPSULE WEEKLY ON FRIDAY)   . [DISCONTINUED] predniSONE (DELTASONE) 5 MG tablet Take 4 tablets daily for 1 day, Take 2 tablets daily for 1 day, and then stop (Patient not taking: Reported on 12/07/2015)    No facility-administered encounter medications on file as of 12/07/2015.    Functional Status:   In your present state of health, do you have any difficulty performing the following activities: 11/29/2015 11/02/2015  Hearing? N N  Vision? Y Y  Difficulty concentrating or making decisions? Y N  Walking or climbing stairs? Y Y  Dressing or bathing? Y N  Doing errands, shopping? Y Y  Preparing Food and eating ? - -  Using the Toilet? - -  In the past six months, have you accidently leaked urine? - -  Do you have problems with loss of bowel control? - -  Managing your Medications? - -  Managing your Finances? - -  Housekeeping or managing  your Housekeeping? - -    Fall/Depression Screening:    PHQ 2/9 Scores 09/07/2015 09/04/2015 07/28/2015 04/03/2015 01/05/2015 12/19/2014 12/19/2014  PHQ - 2 Score 4 4 4 1 2 2 2   PHQ- 9 Score 16 15 15  - - - 10    Assessment:  CHF, COPD, DM  Plan:  Encouraged pt and husband to close windows during high pollen count days and use the air condition.  Encouraged pt to try to comply with her chronic illness management regimen.  Elevate legs so that her diuretic may work with gravity to reduce her edema.  I have so very much enjoyed working with Mrs. Mcferrin over the last 3 years. It is bittersweet that  I am passing the baton to the [facility]. Today is my last visit.  THN CM Care Plan Problem One        Most Recent Value   Care Plan Problem One  High Risk for rehospitaliization.   Role Documenting the Problem One  Care Management [place] for Problem One  Active   THN Long Term Goal (31-90 days)  Pt will not readmit in the next 90 days.   THN Long Term Goal Start Date  11/14/15   Interventions for Problem One Long Term Goal  Pt did have a hospistalization this mo. She is now on palliative care services.   THN CM Short Term Goal #1 (0-30 days)  Pt will not readmit in the next 30 days.   THN CM Short Term Goal #1 Start Date  11/14/15   Interventions for Short Term Goal #1  Pt had hospitalization. Now on palliative care services.    THN CM Care Plan Problem Two        Most Recent Value   Care Plan Problem Two  Uncontrolled diabetes.   Role Documenting the Problem Two  Care Management Coordinator   Care Plan for Problem Two  Active   THN CM Short Term Goal #1 (0-30 days)  Majority of pt glucose readings will be under 200 mg/dl.   THN CM Short Term Goal #1 Start Date  11/14/15   Interventions for Short Term Goal #2   Poor control only 8 readings all month under 200. Encouraged improved compliance.     Deloria Lair [facility] [phone]

## 2015-12-07 NOTE — Telephone Encounter (Signed)
Tammy notified.

## 2015-12-08 ENCOUNTER — Other Ambulatory Visit: Payer: Self-pay | Admitting: *Deleted

## 2015-12-08 ENCOUNTER — Other Ambulatory Visit: Payer: Self-pay | Admitting: Cardiology

## 2015-12-08 DIAGNOSIS — E1122 Type 2 diabetes mellitus with diabetic chronic kidney disease: Secondary | ICD-10-CM | POA: Diagnosis not present

## 2015-12-08 DIAGNOSIS — I5032 Chronic diastolic (congestive) heart failure: Secondary | ICD-10-CM | POA: Diagnosis not present

## 2015-12-08 DIAGNOSIS — D61818 Other pancytopenia: Secondary | ICD-10-CM | POA: Diagnosis not present

## 2015-12-08 DIAGNOSIS — I2510 Atherosclerotic heart disease of native coronary artery without angina pectoris: Secondary | ICD-10-CM | POA: Diagnosis not present

## 2015-12-08 DIAGNOSIS — J9611 Chronic respiratory failure with hypoxia: Secondary | ICD-10-CM | POA: Diagnosis not present

## 2015-12-08 DIAGNOSIS — J441 Chronic obstructive pulmonary disease with (acute) exacerbation: Secondary | ICD-10-CM | POA: Diagnosis not present

## 2015-12-08 DIAGNOSIS — N183 Chronic kidney disease, stage 3 (moderate): Secondary | ICD-10-CM | POA: Diagnosis not present

## 2015-12-08 DIAGNOSIS — E1142 Type 2 diabetes mellitus with diabetic polyneuropathy: Secondary | ICD-10-CM | POA: Diagnosis not present

## 2015-12-08 DIAGNOSIS — I130 Hypertensive heart and chronic kidney disease with heart failure and stage 1 through stage 4 chronic kidney disease, or unspecified chronic kidney disease: Secondary | ICD-10-CM | POA: Diagnosis not present

## 2015-12-08 DIAGNOSIS — I350 Nonrheumatic aortic (valve) stenosis: Secondary | ICD-10-CM | POA: Diagnosis not present

## 2015-12-08 DIAGNOSIS — E1165 Type 2 diabetes mellitus with hyperglycemia: Secondary | ICD-10-CM | POA: Diagnosis not present

## 2015-12-08 DIAGNOSIS — K7581 Nonalcoholic steatohepatitis (NASH): Secondary | ICD-10-CM | POA: Diagnosis not present

## 2015-12-08 DIAGNOSIS — K7469 Other cirrhosis of liver: Secondary | ICD-10-CM | POA: Diagnosis not present

## 2015-12-08 MED ORDER — TORSEMIDE 20 MG PO TABS: 40.0000 mg | ORAL_TABLET | Freq: Every day | ORAL | Status: DC

## 2015-12-08 NOTE — Telephone Encounter (Signed)
°*  STAT* If patient is at the pharmacy, call can be transferred to refill team.   1. Which medications need to be refilled? (please list name of each medication and dose if known) Torsemide-Pharmacist said they had not heard from anybody-please call today,she is out of her medicine  2. Which pharmacy/location (including street and city if local pharmacy) [phone] 3. Do they need a 30 day or 90 day supply? 60 and refills

## 2015-12-08 NOTE — Telephone Encounter (Signed)
E-SENT MEDICATION TO PHARMACY AND GAVE 1 ADDITIONAL REFILL.

## 2015-12-11 ENCOUNTER — Ambulatory Visit (INDEPENDENT_AMBULATORY_CARE_PROVIDER_SITE_OTHER)
Admission: RE | Admit: 2015-12-11 | Discharge: 2015-12-11 | Disposition: A | Discharge status: Home / Self Care | Payer: Medicare Other | Source: Ambulatory Visit | Attending: Family Medicine | Admitting: Family Medicine

## 2015-12-11 ENCOUNTER — Ambulatory Visit (INDEPENDENT_AMBULATORY_CARE_PROVIDER_SITE_OTHER): Payer: Medicare Other | Admitting: Family Medicine

## 2015-12-11 ENCOUNTER — Encounter: Payer: Self-pay | Admitting: Family Medicine

## 2015-12-11 VITALS — BP 150/90 | HR 71 | Temp 98.3°F | Wt 211.0 lb

## 2015-12-11 DIAGNOSIS — I359 Nonrheumatic aortic valve disorder, unspecified: Secondary | ICD-10-CM

## 2015-12-11 DIAGNOSIS — Z515 Encounter for palliative care: Secondary | ICD-10-CM

## 2015-12-11 DIAGNOSIS — R05 Cough: Secondary | ICD-10-CM | POA: Diagnosis not present

## 2015-12-11 DIAGNOSIS — I10 Essential (primary) hypertension: Secondary | ICD-10-CM

## 2015-12-11 DIAGNOSIS — J441 Chronic obstructive pulmonary disease with (acute) exacerbation: Secondary | ICD-10-CM | POA: Diagnosis not present

## 2015-12-11 DIAGNOSIS — E1122 Type 2 diabetes mellitus with diabetic chronic kidney disease: Secondary | ICD-10-CM

## 2015-12-11 DIAGNOSIS — J9621 Acute and chronic respiratory failure with hypoxia: Secondary | ICD-10-CM

## 2015-12-11 DIAGNOSIS — N183 Chronic kidney disease, stage 3 unspecified: Secondary | ICD-10-CM

## 2015-12-11 DIAGNOSIS — K7469 Other cirrhosis of liver: Secondary | ICD-10-CM

## 2015-12-11 DIAGNOSIS — E118 Type 2 diabetes mellitus with unspecified complications: Secondary | ICD-10-CM

## 2015-12-11 NOTE — Progress Notes (Signed)
Pre visit review using our clinic review tool, if applicable. No additional management support is needed unless otherwise documented below in the visit note. 

## 2015-12-11 NOTE — Patient Instructions (Signed)
Xray and labs today - we will call you with results and plan.

## 2015-12-11 NOTE — Progress Notes (Signed)
BP 150/90 mmHg  Pulse 71  Temp(Src) 98.3 F (36.8 C) (Oral)  Wt 211 lb (95.709 kg)  SpO2 96% on 4L [place]  CC: hosp f/u visit  Subjective:    Patient ID: [name], female    DOB: Oct 22, 1940, 75 y.o.   MRN: [id number]  HPI: [name] is a 75 y.o. female presenting on 12/11/2015 for Hospitalization Follow-up   Recent hospitalization for COPD exacerbation. Initially treated with solumedrol then transitioned to oral prednisone. Completed levaquin course 12/02/2015. Weight 217lbs on discharge.   Not feeling well since she's been home. Endorses R back pain, abd discomfort, staying nauseated, fatigued without energy, coughing productive of green to brown sputum. Tmax 99.5 last week. She has increased her oxygen to 4 L. Weight down 6lbs due to inability to eat.   Currently has [facility] coming out to house as well as home palliative care services. She has [facility] coming out to house.   12/07/2015 started on 5d doxycycline course by home hospice and pallitative care for fever to 99.4, green productive cough, diminished breath sounds throughout.   Received letter from Deloria Lair about [facility] closing case due to transition to [facility] palliative care at home.   Admit Date: 11/28/2015 Discharge date: 11/30/2015 Hospital f/u phone call: 12/04/2015  Recommendations for Outpatient Follow-up:  1. Resume/Initiate home hospice on discharge.  PRIMARY DISCHARGE DIAGNOSIS: Active Problems:  COPD exacerbation (HCC)  Dyspnea   Relevant past medical, surgical, family and social history reviewed and updated as indicated. Interim medical history since our last visit reviewed. Allergies and medications reviewed and updated. Current Outpatient Prescriptions on File Prior to Visit  Medication Sig  . ADVAIR DISKUS 250-50 MCG/DOSE AEPB INHALE 1 PUFF INTO LUNGS TWICE A DAY  . albuterol (PROAIR HFA) 108 (90 BASE) MCG/ACT inhaler Inhale 2 puffs into the lungs every 6 (six) hours as  needed for wheezing or shortness of breath. Reported on 08/18/2015  . ALPRAZolam (XANAX) 1 MG tablet Take 0.5-1 tablets (0.5-1 mg total) by mouth 2 (two) times daily as needed for anxiety or sleep.  . B Complex-C ([facility] B COMPLEX PLUS C) TABS TAKE ONE CAPSULE BY MOUTH EVERY DAY  . buPROPion (WELLBUTRIN XL) 150 MG 24 hr tablet Take 150 mg by mouth daily.  [name] desvenlafaxine (PRISTIQ) 50 MG 24 hr tablet Take 3 tablets (150 mg total) by mouth daily.  . ferrous sulfate 325 (65 FE) MG tablet Take 1 tablet (325 mg total) by mouth daily with breakfast.  . fluconazole (DIFLUCAN) 150 MG tablet Take 1 tablet (150 mg total) by mouth once. may repeat in 4 days if needed (Patient taking differently: Take 150 mg by mouth See admin instructions. Take 1 tablet on day one, may repeat in 4 days if needed for infection)  . glucose blood (ONE TOUCH ULTRA TEST) test strip Check blood sugar 4 times daily  . guaiFENesin (MUCINEX) 600 MG 12 hr tablet Take 600 mg by mouth 2 (two) times daily.   . Insulin Glargine (LANTUS SOLOSTAR) 100 UNIT/ML Solostar Pen Inject 120 Units into the skin every morning.  . insulin lispro (HUMALOG KWIKPEN) 100 UNIT/ML KiwkPen Inject 10-25 Units into the skin daily with supper.   . Insulin Pen Needle (B-D ULTRAFINE III SHORT PEN) 31G X 8 MM MISC Use as directed to inject insulin twice daily. Dx: E11.8  . KLOR-CON M20 20 MEQ tablet TAKE 2 TABLETS EVERY DAY  . lactulose, encephalopathy, (CHRONULAC) 10 GM/15ML SOLN Take 20 g  by mouth 3 (three) times daily.   [name] levalbuterol (XOPENEX) 0.63 MG/3ML nebulizer solution Take 0.63 mg by nebulization every 8 (eight) hours as needed for wheezing or shortness of breath.   . loratadine (CLARITIN) 10 MG tablet TAKE 1 TABLET BY MOUTH EVERY DAY  . metolazone (ZAROXOLYN) 2.5 MG tablet TAKE AS DIRECTED FOR 4 LB WEIGHT GAIN  . Multiple Vitamins-Minerals (CENTRUM SILVER PO) Take 1 tablet by mouth daily.   . mupirocin cream (BACTROBAN) 2 % Apply 1 application topically 2  (two) times daily. Reported on 11/14/2015  . oxycodone (OXY-IR) 5 MG capsule Take 1 capsule (5 mg total) by mouth every 6 (six) hours as needed for pain.  . pantoprazole (PROTONIX) 40 MG tablet TAKE 1 TABLET BY MOUTH TWICE A DAY  . promethazine (PHENERGAN) 25 MG tablet Take 25 mg by mouth 2 (two) times daily as needed for nausea or vomiting.  [name] QUEtiapine (SEROQUEL) 200 MG tablet TAKE 1 TABLET BY MOUTH AT BEDTIME  . rifaximin (XIFAXAN) 550 MG TABS tablet Take 550 mg by mouth 2 (two) times daily. Reported on 10/17/2015  . rOPINIRole (REQUIP) 4 MG tablet Take 4 mg by mouth at bedtime.  . simvastatin (ZOCOR) 40 MG tablet Take 40 mg by mouth daily.  [name] torsemide (DEMADEX) 20 MG tablet Take 2 tablets (40 mg total) by mouth daily. KEEP OV.  . Vitamin D, Ergocalciferol, (DRISDOL) 50000 units CAPS capsule TAKE ONE CAPSULE WEEKLY (Patient taking differently: TAKE ONE CAPSULE WEEKLY ON FRIDAY)   No current facility-administered medications on file prior to visit.    Review of Systems Per HPI unless specifically indicated in ROS section     Objective:    BP 150/90 mmHg  Pulse 71  Temp(Src) 98.3 F (36.8 C) (Oral)  Wt 211 lb (95.709 kg)  SpO2 96%  Wt Readings from Last 3 Encounters:  12/11/15 211 lb (95.709 kg)  12/07/15 218 lb (98.884 kg)  11/30/15 217 lb 1.6 oz (98.476 kg)   Body mass index is 38.58 kg/(m^2).  Physical Exam  Constitutional: She appears well-developed and well-nourished. No distress.  HENT:  Mouth/Throat: Oropharynx is clear and moist. No oropharyngeal exudate.  Blood posterior oropharynx  Cardiovascular: Normal rate, regular rhythm and intact distal pulses.   Murmur (4/6 systolic murmur) heard. Pulmonary/Chest: Effort normal and breath sounds normal. No respiratory distress. She has no wheezes. She has no rales.  Lungs overall clear  Musculoskeletal: She exhibits no edema.  No significant edema today  Skin: Skin is warm and dry. No rash noted.  Nursing note and vitals  reviewed.       Assessment & Plan:   Problem List Items Addressed This Visit    Aortic valve disorder (Chronic)    Aortic stenosis murmur present.       Essential hypertension    bp elevated today - no changes made to medication.      COPD exacerbation ([place]) - Primary    Resolving COPD exacerbation. rec return to 3L [place]. Has completed levaquin and prednisone course and then doxycycline 5d course over weekend. Check CXR and labs today to further evaluate for ongoing infection - no clinical evidence of this today.       Relevant Orders   DG Chest 2 View (Completed)   CKD stage 3 secondary to diabetes [facility])   Relevant Orders   Comprehensive metabolic panel   CBC with Differential/Platelet   Hepatic cirrhosis (HCC)   Relevant Orders   Comprehensive metabolic panel   CBC  with Differential/Platelet   Lipase   Palliative care status    [facility] palliative care involved      Acute on chronic respiratory failure ([place])    Resolving, recommended return to 3L [place].  Maintaining O2 sat on 3L [place].       Diabetes mellitus with complication ([place])    Noted upward trend in cbg's while on steroids, endorses sugars returning to normal. Lab Results  Component Value Date   HGBA1C 6.9* 11/28/2015  likely needs fructosamine to monitor sugars.          Follow up plan: Return if symptoms worsen or fail to improve.  Ria Bush, MD

## 2015-12-12 ENCOUNTER — Telehealth: Payer: Self-pay

## 2015-12-12 ENCOUNTER — Encounter: Payer: Self-pay | Admitting: Family Medicine

## 2015-12-12 DIAGNOSIS — I2510 Atherosclerotic heart disease of native coronary artery without angina pectoris: Secondary | ICD-10-CM | POA: Diagnosis not present

## 2015-12-12 DIAGNOSIS — I350 Nonrheumatic aortic (valve) stenosis: Secondary | ICD-10-CM | POA: Diagnosis not present

## 2015-12-12 DIAGNOSIS — N183 Chronic kidney disease, stage 3 (moderate): Secondary | ICD-10-CM | POA: Diagnosis not present

## 2015-12-12 DIAGNOSIS — K7581 Nonalcoholic steatohepatitis (NASH): Secondary | ICD-10-CM | POA: Diagnosis not present

## 2015-12-12 DIAGNOSIS — K7469 Other cirrhosis of liver: Secondary | ICD-10-CM | POA: Diagnosis not present

## 2015-12-12 DIAGNOSIS — I130 Hypertensive heart and chronic kidney disease with heart failure and stage 1 through stage 4 chronic kidney disease, or unspecified chronic kidney disease: Secondary | ICD-10-CM | POA: Diagnosis not present

## 2015-12-12 DIAGNOSIS — J441 Chronic obstructive pulmonary disease with (acute) exacerbation: Secondary | ICD-10-CM | POA: Diagnosis not present

## 2015-12-12 DIAGNOSIS — D61818 Other pancytopenia: Secondary | ICD-10-CM | POA: Diagnosis not present

## 2015-12-12 DIAGNOSIS — I5032 Chronic diastolic (congestive) heart failure: Secondary | ICD-10-CM | POA: Diagnosis not present

## 2015-12-12 DIAGNOSIS — J9611 Chronic respiratory failure with hypoxia: Secondary | ICD-10-CM | POA: Diagnosis not present

## 2015-12-12 DIAGNOSIS — E1165 Type 2 diabetes mellitus with hyperglycemia: Secondary | ICD-10-CM | POA: Diagnosis not present

## 2015-12-12 DIAGNOSIS — E1142 Type 2 diabetes mellitus with diabetic polyneuropathy: Secondary | ICD-10-CM | POA: Diagnosis not present

## 2015-12-12 DIAGNOSIS — E1122 Type 2 diabetes mellitus with diabetic chronic kidney disease: Secondary | ICD-10-CM | POA: Diagnosis not present

## 2015-12-12 LAB — CBC WITH DIFFERENTIAL/PLATELET
BASOS ABS: 0.1 10*3/uL (ref 0.0–0.1)
BASOS PCT: 1 % (ref 0.0–3.0)
Eosinophils Absolute: 0.1 10*3/uL (ref 0.0–0.7)
Eosinophils Relative: 1.8 % (ref 0.0–5.0)
HCT: 28.6 % — ABNORMAL LOW (ref 36.0–46.0)
Hemoglobin: 9.7 g/dL — ABNORMAL LOW (ref 12.0–15.0)
LYMPHS ABS: 0.4 10*3/uL — AB (ref 0.7–4.0)
Lymphocytes Relative: 6.3 % — ABNORMAL LOW (ref 12.0–46.0)
MCHC: 33.9 g/dL (ref 30.0–36.0)
MCV: 92 fl (ref 78.0–100.0)
Monocytes Absolute: 0.4 10*3/uL (ref 0.1–1.0)
Monocytes Relative: 7.3 % (ref 3.0–12.0)
NEUTROS ABS: 4.8 10*3/uL (ref 1.4–7.7)
NEUTROS PCT: 83.6 % — AB (ref 43.0–77.0)
PLATELETS: 98 10*3/uL — AB (ref 150.0–400.0)
RBC: 3.11 Mil/uL — ABNORMAL LOW (ref 3.87–5.11)
RDW: 16.6 % — AB (ref 11.5–15.5)
WBC: 5.8 10*3/uL (ref 4.0–10.5)

## 2015-12-12 LAB — COMPREHENSIVE METABOLIC PANEL
ALT: 22 U/L (ref 0–35)
AST: 28 U/L (ref 0–37)
Albumin: 3.4 g/dL — ABNORMAL LOW (ref 3.5–5.2)
Alkaline Phosphatase: 113 U/L (ref 39–117)
BUN: 51 mg/dL — ABNORMAL HIGH (ref 6–23)
CHLORIDE: 96 meq/L (ref 96–112)
CO2: 35 meq/L — AB (ref 19–32)
CREATININE: 1.28 mg/dL — AB (ref 0.40–1.20)
Calcium: 9.6 mg/dL (ref 8.4–10.5)
GFR: 43.52 mL/min — ABNORMAL LOW (ref >60.00)
GLUCOSE: 282 mg/dL — AB (ref 70–99)
Potassium: 3.5 mEq/L (ref 3.5–5.1)
SODIUM: 137 meq/L (ref 135–145)
Total Bilirubin: 1 mg/dL (ref 0.2–1.2)
Total Protein: 6.1 g/dL (ref 6.0–8.3)

## 2015-12-12 LAB — LIPASE: Lipase: 32 U/L (ref 11.0–59.0)

## 2015-12-12 NOTE — Assessment & Plan Note (Signed)
Aortic stenosis murmur present.

## 2015-12-12 NOTE — Assessment & Plan Note (Signed)
bp elevated today - no changes made to medication.

## 2015-12-12 NOTE — Assessment & Plan Note (Signed)
Resolving COPD exacerbation. rec return to 3L [place]. Has completed levaquin and prednisone course and then doxycycline 5d course over weekend. Check CXR and labs today to further evaluate for ongoing infection - no clinical evidence of this today.

## 2015-12-12 NOTE — Telephone Encounter (Signed)
xray was ok, white count was not elevated, lungs were clear. I don't think she has pneumonia.  If she'd like we can try codeine cough syrup for night time cough. How are weights?

## 2015-12-12 NOTE — Assessment & Plan Note (Signed)
Resolving, recommended return to 3L [place].  Maintaining O2 sat on 3L [place].

## 2015-12-12 NOTE — Telephone Encounter (Signed)
Pt thinks that abx was to be [facility] to [facility] on 12/11/15 when pt was seen. I do not see where abx was sent from 12/11/15 office note; pt thinks she has pneumonia; advised pt per CXR result note that no pneumonia was seen and CXR was normal; pt voiced understanding but request cb about abx being sent to pharmacy.

## 2015-12-12 NOTE — Assessment & Plan Note (Signed)
[facility] involved

## 2015-12-12 NOTE — Assessment & Plan Note (Signed)
Noted upward trend in cbg's while on steroids, endorses sugars returning to normal. Lab Results  Component Value Date   HGBA1C 6.9* 11/28/2015  likely needs fructosamine to monitor sugars.

## 2015-12-13 ENCOUNTER — Telehealth: Payer: Self-pay | Admitting: Cardiovascular Disease

## 2015-12-13 MED ORDER — GUAIFENESIN-CODEINE 100-10 MG/5ML PO SYRP: 5.0000 mL | ORAL_SOLUTION | Freq: Two times a day (BID) | ORAL | Status: DC | PRN

## 2015-12-13 NOTE — Telephone Encounter (Signed)
Rx called in as directed.   

## 2015-12-13 NOTE — Telephone Encounter (Signed)
I spoke with [name] and made her aware that the pt does have a diagnosis of Chronic diastolic heart failure.  Last Echo performed 11/02/15 showed EF of 60-65%.

## 2015-12-13 NOTE — Telephone Encounter (Signed)
Pt interested in the [facility] CHF trial-nurse case manager Havery Moros needs confirmation that patient has CHF and what/when was her latest EF

## 2015-12-13 NOTE — Telephone Encounter (Signed)
plz phone in codeine cough syrup  Wt Readings from Last 3 Encounters:  12/11/15 211 lb (95.709 kg)  12/07/15 218 lb (98.884 kg)  11/30/15 217 lb 1.6 oz (98.476 kg)  weight looking good.

## 2015-12-13 NOTE — Telephone Encounter (Signed)
[name] pts care giver called (pt gave permission to speak with [name]); [name] notified as instructed and [name] relayed message to pt; pt does want codeine cough syrup; [facility]; if rx needs to be picked up call when ready for pickup. Pts weight is doing better; 12/12/15 pts weight was 210 lbs.

## 2015-12-14 ENCOUNTER — Telehealth: Payer: Self-pay | Admitting: Cardiovascular Disease

## 2015-12-14 DIAGNOSIS — E1142 Type 2 diabetes mellitus with diabetic polyneuropathy: Secondary | ICD-10-CM | POA: Diagnosis not present

## 2015-12-14 DIAGNOSIS — E1122 Type 2 diabetes mellitus with diabetic chronic kidney disease: Secondary | ICD-10-CM | POA: Diagnosis not present

## 2015-12-14 DIAGNOSIS — E1165 Type 2 diabetes mellitus with hyperglycemia: Secondary | ICD-10-CM | POA: Diagnosis not present

## 2015-12-14 DIAGNOSIS — K7581 Nonalcoholic steatohepatitis (NASH): Secondary | ICD-10-CM | POA: Diagnosis not present

## 2015-12-14 DIAGNOSIS — I5032 Chronic diastolic (congestive) heart failure: Secondary | ICD-10-CM | POA: Diagnosis not present

## 2015-12-14 DIAGNOSIS — N183 Chronic kidney disease, stage 3 (moderate): Secondary | ICD-10-CM | POA: Diagnosis not present

## 2015-12-14 DIAGNOSIS — I130 Hypertensive heart and chronic kidney disease with heart failure and stage 1 through stage 4 chronic kidney disease, or unspecified chronic kidney disease: Secondary | ICD-10-CM | POA: Diagnosis not present

## 2015-12-14 DIAGNOSIS — I2510 Atherosclerotic heart disease of native coronary artery without angina pectoris: Secondary | ICD-10-CM | POA: Diagnosis not present

## 2015-12-14 DIAGNOSIS — J441 Chronic obstructive pulmonary disease with (acute) exacerbation: Secondary | ICD-10-CM | POA: Diagnosis not present

## 2015-12-14 DIAGNOSIS — K7469 Other cirrhosis of liver: Secondary | ICD-10-CM | POA: Diagnosis not present

## 2015-12-14 DIAGNOSIS — J9611 Chronic respiratory failure with hypoxia: Secondary | ICD-10-CM | POA: Diagnosis not present

## 2015-12-14 DIAGNOSIS — I350 Nonrheumatic aortic (valve) stenosis: Secondary | ICD-10-CM | POA: Diagnosis not present

## 2015-12-14 DIAGNOSIS — D61818 Other pancytopenia: Secondary | ICD-10-CM | POA: Diagnosis not present

## 2015-12-14 NOTE — Telephone Encounter (Signed)
Spoke with pt, for the last 10 days she feels like there is a lump in the center of her chest and it feels like the lump is turning summersaults. The lump seems to be there all the time and she has taken one NTG, she reports that is the only thing that has eased the lump. Pt reports she has a slight fever and cough of dusty to green-yellow sputum. Her medical doctor has told her to take mucinex, it is too soon for antibiotics, she just completed a course. She reports eating makes the lump feel heavier and it feels like her food maybe getting stuck. She takes omeprazole and this is a different feeling from her GERD. She has another nurse coming to the home today, she will let them know she has an appointment on Tuesday and will have them call if concerned. Pt agreed with this plan.

## 2015-12-14 NOTE — Telephone Encounter (Signed)
New message    [facility] calling    Patient C/O heart is flipping,. Patient has taken a nitro with relieve.  HHRN states patient verbalize it's not that bad to go emergency.    Vitals signs today  170/80, 76, 18, 99.9  95 % -3 liter oxygen dependent  No chest pain,  appt move up from 4/28 to  4/25 with APP @ 10:00 AM

## 2015-12-15 ENCOUNTER — Encounter: Payer: Self-pay | Admitting: *Deleted

## 2015-12-15 DIAGNOSIS — E1122 Type 2 diabetes mellitus with diabetic chronic kidney disease: Secondary | ICD-10-CM | POA: Diagnosis not present

## 2015-12-15 DIAGNOSIS — N183 Chronic kidney disease, stage 3 (moderate): Secondary | ICD-10-CM | POA: Diagnosis not present

## 2015-12-15 DIAGNOSIS — D61818 Other pancytopenia: Secondary | ICD-10-CM | POA: Diagnosis not present

## 2015-12-15 DIAGNOSIS — J441 Chronic obstructive pulmonary disease with (acute) exacerbation: Secondary | ICD-10-CM | POA: Diagnosis not present

## 2015-12-15 DIAGNOSIS — I5032 Chronic diastolic (congestive) heart failure: Secondary | ICD-10-CM | POA: Diagnosis not present

## 2015-12-15 DIAGNOSIS — K7581 Nonalcoholic steatohepatitis (NASH): Secondary | ICD-10-CM | POA: Diagnosis not present

## 2015-12-15 DIAGNOSIS — I130 Hypertensive heart and chronic kidney disease with heart failure and stage 1 through stage 4 chronic kidney disease, or unspecified chronic kidney disease: Secondary | ICD-10-CM | POA: Diagnosis not present

## 2015-12-15 DIAGNOSIS — I2510 Atherosclerotic heart disease of native coronary artery without angina pectoris: Secondary | ICD-10-CM | POA: Diagnosis not present

## 2015-12-15 DIAGNOSIS — I350 Nonrheumatic aortic (valve) stenosis: Secondary | ICD-10-CM | POA: Diagnosis not present

## 2015-12-15 DIAGNOSIS — E1142 Type 2 diabetes mellitus with diabetic polyneuropathy: Secondary | ICD-10-CM | POA: Diagnosis not present

## 2015-12-15 DIAGNOSIS — K7469 Other cirrhosis of liver: Secondary | ICD-10-CM | POA: Diagnosis not present

## 2015-12-15 DIAGNOSIS — E1165 Type 2 diabetes mellitus with hyperglycemia: Secondary | ICD-10-CM | POA: Diagnosis not present

## 2015-12-15 DIAGNOSIS — J9611 Chronic respiratory failure with hypoxia: Secondary | ICD-10-CM | POA: Diagnosis not present

## 2015-12-18 NOTE — Progress Notes (Signed)
Cardiology Office Note:    Date:  12/19/2015   ID:  [name], DOB 1941-03-15, MRN [id number]  PCP:  Ria Bush, MD  Cardiologist:  Dr. Sherren Mocha   Electrophysiologist:  n/a  Chief Complaint  Patient presents with  . Congestive Heart Failure    follow up    History of Present Illness:     [name] is a 75 y.o. 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 The patient has had a significant problem with worsening anemia. She is followed closely by gastroenterology. She has required periodic iron infusions as well as Aranesp injections every 2 weeks to maintain her hemoglobin. She has required admission to the hospital in the past with worsening anemia requiring transfusion with PRBCs. Prior EGD demonstrated GAVE and she has undergone multiple APC ablations of AVMs. She has been referred to gastroenterology at [facility] for more specialized treatment of her AVMs.  Admitted in 1/17 with a/c respiratory failure secondary to AECOPD3  Last seen by Dr. Burt Knack 3/17. She was volume overloaded at that time. Lasix was adjusted. Carvedilol was stopped.  Admitted 3/8-3/13 with AE COPD and a/c diastolic HF. She was diuresed with IV Lasix and discharged on torsemide. Of note, she was seen by palliative care who recommended outpatient follow-up.  Seen by PCP post DC and she requested DNR/DNI.    Admitted 4/4-/6 with AE COPD. She was treated with a combination of steroids and bronchodilators as well as antibiotics. She did not require diuresis.    Here for follow-up. She continues to do poorly. She has chronic dyspnea without significant change. Weight was up 4 pounds today and she took an extra metolazone. She had previously noted a baseline weight of about 210-211. She felt better at  this weight. LE edema is fairly stable. She notes occasional chest pain associated with palpitations. She takes nitroglycerin with relief. She denies radiating symptoms but does note nausea and diaphoresis. She's had similar symptoms in the past. These may be worse recently. She denies any recent bleeding.   Past Medical History  Diagnosis Date  . Chronic airway obstruction, not elsewhere classified   . Unspecified chronic bronchitis ([place])   . Unspecified essential hypertension   . Non-obstructive CAD   . Palpitations   . Mild aortic stenosis     a. 03/2014 Valve area (VTI): 2.89 cm^2, Valve area (Vmax): 2.7 cm^2.  . Pure hypercholesterolemia   . Morbid obesity ([place])   . Esophageal reflux   . GAVE (gastric antral vascular ectasia)     angiodysplasia; s/p multiple APCs, discussing RFA with [facility] GI Dr Newman Pies (06/2015)  . Diverticulosis of colon (without mention of hemorrhage)   . Benign neoplasm of colon   . Osteoarthrosis, unspecified whether generalized or localized, unspecified site   . Osteoporosis, unspecified   . Restless legs syndrome (RLS)   . Major depressive disorder, recurrent episode, severe, specified as with psychotic behavior   . Iron deficiency anemia secondary to blood loss (chronic)     a. frequent PRBC transfusions.  . Coarse tremors     a. arms.  . Carpal tunnel syndrome on both sides   . Chronic diastolic CHF (congestive heart failure) ([place])     a. 03/26/2014 Echo: EF 55-60%, no rwma, mild AS/AI, mod-sev Ca2+ MV annulus, mildly to  mod dil LA.  [name] Asthma   . Type II diabetes mellitus ([place])   . H/O hiatal hernia   . 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 dx'd 1990's  . Adenomatous colon polyp   . Midsternal chest pain     a. conservatively managed ->poor candidate for cath/anticoagulation.  [name] Anxiety   . Portal hypertensive gastropathy   . Falls frequently     completed HHPT/OT 06/2014, PT unmet goals  . Recurrent UTI  (urinary tract infection)     h/o hospitalization with urosepsis 2015, but thought large component colonization/bacteriuria, only treat if symptomatic ([place])  . Hiatal hernia   . Thrombocytopenia ([place])   . Protein calorie malnutrition ([place])   . On home oxygen therapy     "3L; 24/7" (11/29/2015)  . OSA (obstructive sleep apnea)     a. does not use CPAP. (01/25/2015)  . Hepatitis   . CAP (community acquired pneumonia) 2015    Past Surgical History  Procedure Laterality Date  . Hemorroidectomy    . Appendectomy    . Vaginal hysterectomy    . Breast biopsy      bilateral  . Cesarean section      x 3  . Appendectomy    . Esophagogastroduodenoscopy  06/12/2012    Procedure: ESOPHAGOGASTRODUODENOSCOPY (EGD);  Surgeon: Lafayette Dragon, MD;  Location: [facility];  Service: Endoscopy;  Laterality: N/A;  . Balloon dilation  06/12/2012    Procedure: BALLOON DILATION;  Surgeon: Lafayette Dragon, MD;  Location: [facility];  Service: Endoscopy;  Laterality: N/A;  ?balloon  . Esophagogastroduodenoscopy  09/10/2012    Procedure: ESOPHAGOGASTRODUODENOSCOPY (EGD);  Surgeon: Lafayette Dragon, MD;  Location: [facility];  Service: Endoscopy;  Laterality: N/A;  . Hot hemostasis  09/10/2012    Procedure: HOT HEMOSTASIS (ARGON PLASMA COAGULATION/BICAP);  Surgeon: Lafayette Dragon, MD;  Location: [facility];  Service: Endoscopy;  Laterality: N/A;  . Esophagogastroduodenoscopy N/A 01/18/2013    Procedure: ESOPHAGOGASTRODUODENOSCOPY (EGD);  Surgeon: Lafayette Dragon, MD;  Location: [facility] ENDOSCOPY;  Service: Endoscopy;  Laterality: N/A;  . Esophagogastroduodenoscopy N/A 05/24/2013    Procedure: ESOPHAGOGASTRODUODENOSCOPY (EGD);  Surgeon: Jerene Bears, MD;  Location: [place];  Service: Gastroenterology;  Laterality: N/A;  . Hot hemostasis N/A 05/24/2013    Procedure: HOT HEMOSTASIS (ARGON PLASMA COAGULATION/BICAP);  Surgeon: Jerene Bears, MD;  Location: [place];  Service: Gastroenterology;  Laterality: N/A;  . US  echocardiography  07/2014    mild LVH, EF 60-65%, normal wall motion, mild AR, mod dilated LA, mildly dilated RA, peak PA pressure 67mmHg  . Esophagogastroduodenoscopy N/A 10/20/2014    Procedure: ESOPHAGOGASTRODUODENOSCOPY (EGD);  Surgeon: Lafayette Dragon, MD;  Location: [facility];  Service: Endoscopy;  Laterality: N/A;  . Hot hemostasis N/A 10/20/2014    Procedure: HOT HEMOSTASIS (ARGON PLASMA COAGULATION/BICAP);  Surgeon: Lafayette Dragon, MD;  Location: [facility];  Service: Endoscopy;  Laterality: N/A;  . Enteroscopy N/A 02/25/2015    Procedure: ENTEROSCOPY;  Surgeon: [name] Ada, MD;  Location: [place];  Service: Endoscopy;  Laterality: N/A;  . Enteroscopy N/A 06/27/2015    Procedure: ENTEROSCOPY;  Surgeon: Manus Gunning, MD;  Location: [facility];  Service: Gastroenterology;  Laterality: N/A;  . Eus  09/2015    antral CAVL, ablated; nodular erosive gastritis; mult pancreatic cysts rec rpt CT pancreas protocol or MRI to survey pancreas cysts 1-2 yrs (Dr Amie Critchley)    Current Medications: Outpatient Prescriptions Prior to Visit  Medication Sig Dispense Refill  . ADVAIR DISKUS 250-50 MCG/DOSE AEPB INHALE 1 PUFF INTO LUNGS TWICE A DAY 60 each 11  . albuterol (PROAIR HFA) 108 (90 BASE) MCG/ACT inhaler Inhale 2 puffs into the lungs every 6 (six) hours as needed for wheezing or shortness of breath. Reported on 08/18/2015    . ALPRAZolam (XANAX) 1 MG tablet Take 0.5-1 tablets (0.5-1 mg total) by mouth 2 (two) times daily as needed for anxiety or sleep. 60 tablet 0  . B Complex-C ([employer] B COMPLEX PLUS C) TABS TAKE ONE CAPSULE BY MOUTH EVERY DAY 30 tablet 6  . buPROPion (WELLBUTRIN XL) 150 MG 24 hr tablet Take 150 mg by mouth daily.    [name] desvenlafaxine (PRISTIQ) 50 MG 24 hr tablet Take 3 tablets (150 mg total) by mouth daily. 90 tablet 11  . ferrous sulfate 325 (65 FE) MG tablet Take 1 tablet (325 mg total) by mouth daily with breakfast. 90 tablet 1  . glucose blood (ONE TOUCH ULTRA  TEST) test strip Check blood sugar 4 times daily 200 each 3  . guaiFENesin (MUCINEX) 600 MG 12 hr tablet Take 600 mg by mouth 2 (two) times daily.     [name] guaiFENesin-codeine (CHERATUSSIN AC) 100-10 MG/5ML syrup Take 5 mLs by mouth 2 (two) times daily as needed for cough (sedation precautions). 140 mL 0  . Insulin Glargine (LANTUS SOLOSTAR) 100 UNIT/ML Solostar Pen Inject 120 Units into the skin every morning. 20 pen 11  . insulin lispro (HUMALOG KWIKPEN) 100 UNIT/ML KiwkPen Inject 10-25 Units into the skin daily with supper.     . Insulin Pen Needle (B-D ULTRAFINE III SHORT PEN) 31G X 8 MM MISC Use as directed to inject insulin twice daily. Dx: E11.8 200 each 2  . KLOR-CON M20 20 MEQ tablet TAKE 2 TABLETS EVERY DAY 30 tablet 6  . lactulose, encephalopathy, (CHRONULAC) 10 GM/15ML SOLN Take 20 g by mouth 3 (three) times daily.     [name] levalbuterol (XOPENEX) 0.63 MG/3ML nebulizer solution Take 0.63 mg by nebulization every 8 (eight) hours as needed for wheezing or shortness of breath.   5  . loratadine (CLARITIN) 10 MG tablet TAKE 1 TABLET BY MOUTH EVERY DAY 30 tablet 11  . metolazone (ZAROXOLYN) 2.5 MG tablet TAKE AS DIRECTED FOR 4 LB WEIGHT GAIN 30 tablet 0  . Multiple Vitamins-Minerals (CENTRUM SILVER PO) Take 1 tablet by mouth daily.     . mupirocin cream (BACTROBAN) 2 % Apply 1 application topically 2 (two) times daily. Reported on 11/14/2015    . oxycodone (OXY-IR) 5 MG capsule Take 1 capsule (5 mg total) by mouth every 6 (six) hours as needed for pain. 60 capsule 0  . pantoprazole (PROTONIX) 40 MG tablet TAKE 1 TABLET BY MOUTH TWICE A DAY 60 tablet 11  . promethazine (PHENERGAN) 25 MG tablet Take 25 mg by mouth 2 (two) times daily as needed for nausea or vomiting.    [name] QUEtiapine (SEROQUEL) 200 MG tablet TAKE 1 TABLET BY MOUTH AT BEDTIME 30 tablet 0  . rifaximin (XIFAXAN) 550 MG TABS tablet Take 550 mg by mouth 2 (two) times daily. Reported on 10/17/2015    . rOPINIRole (REQUIP) 4 MG tablet Take 4  mg by mouth at bedtime.    . simvastatin (ZOCOR) 40 MG tablet Take 40 mg by mouth daily.    [name] torsemide (DEMADEX) 20 MG tablet Take 2 tablets (40 mg total) by mouth daily. KEEP OV. 60 tablet 1  . Vitamin D,  Ergocalciferol, (DRISDOL) 50000 units CAPS capsule TAKE ONE CAPSULE WEEKLY (Patient taking differently: TAKE ONE CAPSULE WEEKLY ON FRIDAY) 4 capsule 6  . doxycycline (VIBRA-TABS) 100 MG tablet Take 100 mg by mouth 2 (two) times daily. Reported on 12/19/2015    . fluconazole (DIFLUCAN) 150 MG tablet Take 1 tablet (150 mg total) by mouth once. may repeat in 4 days if needed (Patient not taking: Reported on 12/19/2015) 2 tablet 0   No facility-administered medications prior to visit.     Allergies:   Acetaminophen; Nsaids; Aspirin; Theophylline; Penicillin g; and Penicillins   Social History   Social History  . Marital Status: Married    Spouse Name: [name]  . Number of Children: 4  . Years of Education: 12   Occupational History  . Disabled   .     Social History Main Topics  . Smoking status: Former Smoker -- 1.50 packs/day for 25 years    Types: Cigarettes    Quit date: 08/26/1992  . Smokeless tobacco: Never Used  . Alcohol Use: No     Comment: [id number] "last alcoholic drink was years ago"  . Drug Use: No  . Sexual Activity: No   Other Topics Concern  . None   Social History Narrative   Patient lives at home with her husband [name]).    Retired.   Caffeine- None    Right handed.      Dr. Olevia Perches now [facility] (referred to [facility])   Dr. Burt Knack cards    Dr. Loanne Drilling endo   Dr Lenna Gilford pulm   Psych: Followed by [facility] (ph [phone], fax [phone]).      Family History:  The patient's family history includes Breast cancer in her maternal aunt and sister; Cervical cancer in her mother; Diabetes in her father, mother, and sister; Heart attack in her father and mother; Heart disease in her mother; Kidney disease in her mother; Multiple sclerosis  in her other and sister; Stroke in her daughter and daughter. There is no history of Colon cancer.   ROS:   Please see the history of present illness.    Review of Systems  Constitution: Positive for chills, diaphoresis, fever, malaise/fatigue and weight gain.  HENT: Positive for hearing loss.   Eyes: Positive for visual disturbance.  Cardiovascular: Positive for chest pain, dyspnea on exertion, irregular heartbeat and leg swelling.  Respiratory: Positive for cough, shortness of breath and wheezing.   Hematologic/Lymphatic: Bruises/bleeds easily.  Musculoskeletal: Positive for back pain, joint pain and myalgias.  Gastrointestinal: Positive for abdominal pain.  Genitourinary: Positive for hematuria.  Neurological: Positive for dizziness.  Psychiatric/Behavioral: Positive for depression.  All other systems reviewed and are negative.   Physical Exam:    VS:  BP 138/60 mmHg  Pulse 68  Ht 5\' 2"  (1.575 m)  Wt 219 lb (99.338 kg)  BMI 40.05 kg/m2  SpO2 97%   GEN: Well nourished, well developed, in no acute distress HEENT: normal Neck: no JVD at 90, no masses Cardiac: Normal S1/S2, RRR; [id number] systolic murmur RUSB, trace RLE and 1+ LLE edema   Respiratory:  clear to auscultation bilaterally; no wheezing no rales GI: soft, nontender  MS: no deformity or atrophy Skin: warm and dry  Neuro:    no focal deficits  Psych: Alert and oriented x 3, normal affect  Wt Readings from Last 3 Encounters:  12/19/15 219 lb (99.338 kg)  12/11/15 211 lb (95.709 kg)  12/07/15 218 lb (98.884 kg)  Studies/Labs Reviewed:     EKG:  EKG is  ordered today.  The ekg ordered today demonstrates NSR, HR 68, first-degree AV block, PR 232 ms, LAD, QTc 440 ms, no change from prior tracing  Recent Labs: 08/28/2015: TSH 1.411 09/01/2015: Magnesium 2.2 11/21/2015: Pro B Natriuretic peptide (BNP) 72.0 11/28/2015: B Natriuretic Peptide 99.0 12/11/2015: ALT 22; BUN 51*; Creatinine, Ser 1.28*; Hemoglobin 9.7*;  Platelets 98.0*; Potassium 3.5; Sodium 137   Recent Lipid Panel    Component Value Date/Time   CHOL 115 12/13/2014 0902   TRIG 113.0 12/13/2014 0902   HDL 40.50 12/13/2014 0902   CHOLHDL 3 12/13/2014 0902   VLDL 22.6 12/13/2014 0902   LDLCALC 52 12/13/2014 0902    Additional studies/ records that were reviewed today include:   Echo 11/02/15 Moderate concentric LVH, EF 60-65%, normal wall motion, grade 2 diastolic dysfunction, mild aortic stenosis, mild AI (mean 12 mmHg), MAC, no mitral stenosis, severe LAE, mild RAE, trivial TR, trivial PI  Echo 08/29/15 Mild LVH, EF 55-60%, normal wall motion, grade 2 diastolic dysfunction, mild AS (mean 8 mmHg), mild to moderate AI, MAC, mild MS (mean 4 mmHg), mild MR, moderate LAE, mild RAE, moderate TR, PASP 51 mmHg  Echo 01/2013:  EF 55-60%, Gr 2 DD, very mild AS (mean 9 mmHg), mild AI, mild BAE, PASP 39.  Echo 08/14/14 Mild LVH, EF 60-65%, normal wall motion, mild AI, MAC, moderate LAE, mild RAE, PASP 39 mmHg  LHC 11/2007:  Dx 20%, proximal and mid RCA 20-30%, EF 50-55%.   ASSESSMENT:     1. Precordial pain   2. Chronic diastolic heart failure ([place])   3. Essential hypertension   4. Coronary artery disease involving native coronary artery of native heart without angina pectoris   5. Iron deficiency anemia due to chronic blood loss   6. Aortic stenosis   7. Chronic obstructive pulmonary disease, unspecified COPD, unspecified chronic bronchitis type     PLAN:     In order of problems listed above:  1. Chest pain - Somewhat atypical for angina.  She is not a candidate for aggressive/invasive evaluation.  She should be treated conservatively.  She has had some relief with NTG. ECG is unchanged.   -  Start Imdur 15 mg QD.  2. Chronic diastolic CHF (congestive heart failure) -  Weight is up today. She took Metolazone this AM.  Her dry weight was ~ 210-211. I have asked her to take an extra Metolazone in 2-3 days if her weight is not back to  210-211.  BMET next week.     3. Essential hypertension - Fair control. Continue current therapy.    4. CAD - Non-obstructive by LHC in the past.  She is not a candidate for aspirin therapy secondary to bleeding. Continue statin.  Add Imdur as noted.    5. Anemia, iron deficiency: Secondary to GAVE (gastric antral vascular ectasia). FU with GI. Recent Hgb stable.   6. Aortic stenosis - Mild by recent echocardiogram.  7. COPD - FU with Pulmonology as planned.    Medication Adjustments/Labs and Tests Ordered: Current medicines are reviewed at length with the patient today.  Concerns regarding medicines are outlined above.  Medication changes, Labs and Tests ordered today are outlined in the Patient Instructions noted below. Patient Instructions  Medication Instructions:  START Isosorbide Mononitrate (Imdur) 30 mg take 1/2 tablet (15 mg) once daily. Labwork: None today. In 1 week - BMET (ok to have home health RN draw this)  Testing/Procedures: None  Follow-Up: Dr. Sherren Mocha in June as planned. Any Other Special Instructions Will Be Listed Below (If Applicable). If your weight is not back down to 210-211 by Friday, take another Metolazone 2.5 mg 30 minutes before your Torsemide. If you need a refill on your cardiac medications before your next appointment, please call your pharmacy.    Signed, Richardson Dopp, PA-C  12/19/2015 11:02 AM    [facility] [address], [place], [place]  [address] Phone: [phone]; Fax: [phone]

## 2015-12-19 ENCOUNTER — Telehealth: Payer: Self-pay

## 2015-12-19 ENCOUNTER — Encounter: Payer: Self-pay | Admitting: Physician Assistant

## 2015-12-19 ENCOUNTER — Other Ambulatory Visit: Payer: Medicare Other

## 2015-12-19 ENCOUNTER — Ambulatory Visit (INDEPENDENT_AMBULATORY_CARE_PROVIDER_SITE_OTHER): Payer: Medicare Other | Admitting: Physician Assistant

## 2015-12-19 VITALS — BP 138/60 | HR 68 | Ht 62.0 in | Wt 219.0 lb

## 2015-12-19 DIAGNOSIS — I10 Essential (primary) hypertension: Secondary | ICD-10-CM | POA: Diagnosis not present

## 2015-12-19 DIAGNOSIS — K7469 Other cirrhosis of liver: Secondary | ICD-10-CM | POA: Diagnosis not present

## 2015-12-19 DIAGNOSIS — J449 Chronic obstructive pulmonary disease, unspecified: Secondary | ICD-10-CM

## 2015-12-19 DIAGNOSIS — D61818 Other pancytopenia: Secondary | ICD-10-CM | POA: Diagnosis not present

## 2015-12-19 DIAGNOSIS — E1142 Type 2 diabetes mellitus with diabetic polyneuropathy: Secondary | ICD-10-CM | POA: Diagnosis not present

## 2015-12-19 DIAGNOSIS — I130 Hypertensive heart and chronic kidney disease with heart failure and stage 1 through stage 4 chronic kidney disease, or unspecified chronic kidney disease: Secondary | ICD-10-CM | POA: Diagnosis not present

## 2015-12-19 DIAGNOSIS — K7581 Nonalcoholic steatohepatitis (NASH): Secondary | ICD-10-CM | POA: Diagnosis not present

## 2015-12-19 DIAGNOSIS — I350 Nonrheumatic aortic (valve) stenosis: Secondary | ICD-10-CM | POA: Diagnosis not present

## 2015-12-19 DIAGNOSIS — E1122 Type 2 diabetes mellitus with diabetic chronic kidney disease: Secondary | ICD-10-CM | POA: Diagnosis not present

## 2015-12-19 DIAGNOSIS — I2510 Atherosclerotic heart disease of native coronary artery without angina pectoris: Secondary | ICD-10-CM | POA: Diagnosis not present

## 2015-12-19 DIAGNOSIS — D500 Iron deficiency anemia secondary to blood loss (chronic): Secondary | ICD-10-CM

## 2015-12-19 DIAGNOSIS — N183 Chronic kidney disease, stage 3 (moderate): Secondary | ICD-10-CM | POA: Diagnosis not present

## 2015-12-19 DIAGNOSIS — I5032 Chronic diastolic (congestive) heart failure: Secondary | ICD-10-CM

## 2015-12-19 DIAGNOSIS — E1165 Type 2 diabetes mellitus with hyperglycemia: Secondary | ICD-10-CM | POA: Diagnosis not present

## 2015-12-19 DIAGNOSIS — J441 Chronic obstructive pulmonary disease with (acute) exacerbation: Secondary | ICD-10-CM | POA: Diagnosis not present

## 2015-12-19 DIAGNOSIS — R072 Precordial pain: Secondary | ICD-10-CM | POA: Diagnosis not present

## 2015-12-19 DIAGNOSIS — J9611 Chronic respiratory failure with hypoxia: Secondary | ICD-10-CM | POA: Diagnosis not present

## 2015-12-19 MED ORDER — ISOSORBIDE MONONITRATE ER 30 MG PO TB24: 15.0000 mg | ORAL_TABLET | Freq: Every day | ORAL | Status: DC

## 2015-12-19 NOTE — Telephone Encounter (Signed)
Tammy nurse with [facility] a home based pallative program; Tammy faxed an update to Dr Darnell Level; Tammy said pt has old issue but pt feels like bugs crawling on outside and inside of her skin; the issue has escalated. On call nurse spoke with family on 12/17/15; pt was hysterical and haloperidol was called in for pt.Tammy got a urine specimen on 12/18/15 and that reported clean; on 12/18/15 pt complained she was getting weaker and leg and hip was hurting. tammy wanted to verify Dr Darnell Level got the fax and cb if needed.

## 2015-12-19 NOTE — Patient Instructions (Addendum)
Medication Instructions:  START Isosorbide Mononitrate (Imdur) 30 mg take 1/2 tablet (15 mg) once daily. Labwork: None today. In 1 week - BMET (ok to have home health RN draw this) Testing/Procedures: None  Follow-Up: Dr. Sherren Mocha in June as planned. Any Other Special Instructions Will Be Listed Below (If Applicable). If your weight is not back down to 210-211 by Friday, take another Metolazone 2.5 mg 30 minutes before your Torsemide. If you need a refill on your cardiac medications before your next appointment, please call your pharmacy.

## 2015-12-19 NOTE — Telephone Encounter (Signed)
Never received [facility] update via fax.  Ok to continue temporary haldol but will want stopped as soon as able.  How is patient doing now with bug crawling feeling? Can we d/c haldol?

## 2015-12-19 NOTE — Telephone Encounter (Signed)
Tammy nurse with [facility] called back; Tammy concerned that Haloperidol can have possible reaction with Seroquel that can cause prolong QT interval; wondered if should d/c haloperidol (pt was taking only to get pt thru crisis over weekend). Tammy request cb.

## 2015-12-19 NOTE — Telephone Encounter (Signed)
Please advice . Thank you

## 2015-12-20 ENCOUNTER — Encounter (HOSPITAL_COMMUNITY)
Admission: RE | Admit: 2015-12-20 | Discharge: 2015-12-20 | Disposition: A | Discharge status: Home / Self Care | Payer: Medicare Other | Source: Ambulatory Visit | Attending: Pulmonary Disease | Admitting: Pulmonary Disease

## 2015-12-20 ENCOUNTER — Telehealth: Payer: Self-pay

## 2015-12-20 ENCOUNTER — Other Ambulatory Visit: Payer: Self-pay | Admitting: Family Medicine

## 2015-12-20 DIAGNOSIS — N289 Disorder of kidney and ureter, unspecified: Secondary | ICD-10-CM | POA: Insufficient documentation

## 2015-12-20 DIAGNOSIS — Q2733 Arteriovenous malformation of digestive system vessel: Secondary | ICD-10-CM | POA: Insufficient documentation

## 2015-12-20 DIAGNOSIS — D649 Anemia, unspecified: Secondary | ICD-10-CM | POA: Diagnosis not present

## 2015-12-20 DIAGNOSIS — K7460 Unspecified cirrhosis of liver: Secondary | ICD-10-CM | POA: Insufficient documentation

## 2015-12-20 LAB — POCT HEMOGLOBIN-HEMACUE: Hemoglobin: 8.4 g/dL — ABNORMAL LOW (ref 12.0–15.0)

## 2015-12-20 MED ORDER — DARBEPOETIN ALFA 200 MCG/0.4ML IJ SOSY
PREFILLED_SYRINGE | INTRAMUSCULAR | Status: AC
  Administered 2015-12-20: 200 ug via SUBCUTANEOUS
  Filled 2015-12-20: qty 0.4

## 2015-12-20 MED ORDER — DARBEPOETIN ALFA 200 MCG/0.4ML IJ SOSY
200.0000 ug | PREFILLED_SYRINGE | INTRAMUSCULAR | Status: DC
  Administered 2015-12-20: 200 ug via SUBCUTANEOUS

## 2015-12-20 NOTE — Telephone Encounter (Signed)
Ok to refill 

## 2015-12-20 NOTE — Telephone Encounter (Signed)
Spoke with [name]. She said patient is doing much better now. No more bug crawling feelings and she is able to focus much better. She went ahead and had her stop the Haldol last PM. She will re-fax update today.

## 2015-12-20 NOTE — Telephone Encounter (Signed)
[place] nurse case mgr said pt is in [facility] CHF program and [place] request last BP and P reading. Pt saw Dr Darnell Level on 12/11/15 BP 150/90 and P 71. Caryl Pina voiced understanding and nothing further needed.

## 2015-12-21 ENCOUNTER — Encounter: Payer: Self-pay | Admitting: *Deleted

## 2015-12-21 DIAGNOSIS — D61818 Other pancytopenia: Secondary | ICD-10-CM | POA: Diagnosis not present

## 2015-12-21 DIAGNOSIS — E1165 Type 2 diabetes mellitus with hyperglycemia: Secondary | ICD-10-CM | POA: Diagnosis not present

## 2015-12-21 DIAGNOSIS — E1142 Type 2 diabetes mellitus with diabetic polyneuropathy: Secondary | ICD-10-CM | POA: Diagnosis not present

## 2015-12-21 DIAGNOSIS — N183 Chronic kidney disease, stage 3 (moderate): Secondary | ICD-10-CM | POA: Diagnosis not present

## 2015-12-21 DIAGNOSIS — K7469 Other cirrhosis of liver: Secondary | ICD-10-CM | POA: Diagnosis not present

## 2015-12-21 DIAGNOSIS — J9611 Chronic respiratory failure with hypoxia: Secondary | ICD-10-CM | POA: Diagnosis not present

## 2015-12-21 DIAGNOSIS — K7581 Nonalcoholic steatohepatitis (NASH): Secondary | ICD-10-CM | POA: Diagnosis not present

## 2015-12-21 DIAGNOSIS — J441 Chronic obstructive pulmonary disease with (acute) exacerbation: Secondary | ICD-10-CM | POA: Diagnosis not present

## 2015-12-21 DIAGNOSIS — I5032 Chronic diastolic (congestive) heart failure: Secondary | ICD-10-CM | POA: Diagnosis not present

## 2015-12-21 DIAGNOSIS — I2510 Atherosclerotic heart disease of native coronary artery without angina pectoris: Secondary | ICD-10-CM | POA: Diagnosis not present

## 2015-12-21 DIAGNOSIS — E1122 Type 2 diabetes mellitus with diabetic chronic kidney disease: Secondary | ICD-10-CM | POA: Diagnosis not present

## 2015-12-21 DIAGNOSIS — I130 Hypertensive heart and chronic kidney disease with heart failure and stage 1 through stage 4 chronic kidney disease, or unspecified chronic kidney disease: Secondary | ICD-10-CM | POA: Diagnosis not present

## 2015-12-21 DIAGNOSIS — I350 Nonrheumatic aortic (valve) stenosis: Secondary | ICD-10-CM | POA: Diagnosis not present

## 2015-12-21 NOTE — Telephone Encounter (Signed)
Rx called in as directed.   

## 2015-12-21 NOTE — Telephone Encounter (Signed)
plz phone in. 

## 2015-12-22 ENCOUNTER — Other Ambulatory Visit: Payer: Medicare Other

## 2015-12-22 ENCOUNTER — Other Ambulatory Visit: Payer: Self-pay | Admitting: Family Medicine

## 2015-12-22 ENCOUNTER — Ambulatory Visit: Payer: Medicare Other | Admitting: Physician Assistant

## 2015-12-22 DIAGNOSIS — D61818 Other pancytopenia: Secondary | ICD-10-CM | POA: Diagnosis not present

## 2015-12-22 DIAGNOSIS — E1165 Type 2 diabetes mellitus with hyperglycemia: Secondary | ICD-10-CM | POA: Diagnosis not present

## 2015-12-22 DIAGNOSIS — E1122 Type 2 diabetes mellitus with diabetic chronic kidney disease: Secondary | ICD-10-CM | POA: Diagnosis not present

## 2015-12-22 DIAGNOSIS — J441 Chronic obstructive pulmonary disease with (acute) exacerbation: Secondary | ICD-10-CM | POA: Diagnosis not present

## 2015-12-22 DIAGNOSIS — J9611 Chronic respiratory failure with hypoxia: Secondary | ICD-10-CM | POA: Diagnosis not present

## 2015-12-22 DIAGNOSIS — I130 Hypertensive heart and chronic kidney disease with heart failure and stage 1 through stage 4 chronic kidney disease, or unspecified chronic kidney disease: Secondary | ICD-10-CM | POA: Diagnosis not present

## 2015-12-22 DIAGNOSIS — I2510 Atherosclerotic heart disease of native coronary artery without angina pectoris: Secondary | ICD-10-CM | POA: Diagnosis not present

## 2015-12-22 DIAGNOSIS — K7469 Other cirrhosis of liver: Secondary | ICD-10-CM | POA: Diagnosis not present

## 2015-12-22 DIAGNOSIS — I350 Nonrheumatic aortic (valve) stenosis: Secondary | ICD-10-CM | POA: Diagnosis not present

## 2015-12-22 DIAGNOSIS — N183 Chronic kidney disease, stage 3 (moderate): Secondary | ICD-10-CM | POA: Diagnosis not present

## 2015-12-22 DIAGNOSIS — I5032 Chronic diastolic (congestive) heart failure: Secondary | ICD-10-CM | POA: Diagnosis not present

## 2015-12-22 DIAGNOSIS — E1142 Type 2 diabetes mellitus with diabetic polyneuropathy: Secondary | ICD-10-CM | POA: Diagnosis not present

## 2015-12-22 DIAGNOSIS — K7581 Nonalcoholic steatohepatitis (NASH): Secondary | ICD-10-CM | POA: Diagnosis not present

## 2015-12-22 NOTE — Addendum Note (Signed)
Addended by: Tonia Ghent on: 12/22/2015 12:03 AM   Modules accepted: Level of Service

## 2015-12-22 NOTE — Telephone Encounter (Signed)
Ok to refill 

## 2015-12-22 NOTE — ED Provider Notes (Signed)
CSN: [id number]     Arrival date & time 11/28/15  0507 History   First MD Initiated Contact with Patient 11/28/15 0532     Chief Complaint  Patient presents with  . Shortness of Breath  . Anxiety     (Consider location/radiation/quality/duration/timing/severity/associated sxs/prior Treatment) HPI   [name] is a 75 y.o. female who presents for evaluation of shortness of breath and nervous feeling for 2 days. She has had a nonproductive cough. She was recently treated for COPD exacerbation. She is followed in the congestive heart failure clinic. She thinks that her weight is near its baseline . She denies nausea, vomiting, change in bowel or urinary habits. She is taking her usual medications, without relief. There are no other known modifying factors.  Past Medical History  Diagnosis Date  . Chronic airway obstruction, not elsewhere classified   . Unspecified chronic bronchitis ([place])   . Unspecified essential hypertension   . Non-obstructive CAD   . Palpitations   . Mild aortic stenosis     a. 03/2014 Valve area (VTI): 2.89 cm^2, Valve area (Vmax): 2.7 cm^2.  . Pure hypercholesterolemia   . Morbid obesity ([place])   . Esophageal reflux   . GAVE (gastric antral vascular ectasia)     angiodysplasia; s/p multiple APCs, discussing RFA with [facility] GI Dr Newman Pies (06/2015)  . Diverticulosis of colon (without mention of hemorrhage)   . Benign neoplasm of colon   . Osteoarthrosis, unspecified whether generalized or localized, unspecified site   . Osteoporosis, unspecified   . Restless legs syndrome (RLS)   . Major depressive disorder, recurrent episode, severe, specified as with psychotic behavior   . Iron deficiency anemia secondary to blood loss (chronic)     a. frequent PRBC transfusions.  . Coarse tremors     a. arms.  . Carpal tunnel syndrome on both sides   . Chronic diastolic CHF (congestive heart failure) ([place])     a. 03/26/2014 Echo: EF 55-60%, no rwma, mild AS/AI, mod-sev  Ca2+ MV annulus, mildly to mod dil LA.  [name] Asthma   . Type II diabetes mellitus ([place])   . H/O hiatal hernia   . 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 dx'd 1990's  . Adenomatous colon polyp   . Midsternal chest pain     a. conservatively managed ->poor candidate for cath/anticoagulation.  [name] Anxiety   . Portal hypertensive gastropathy   . Falls frequently     completed HHPT/OT 06/2014, PT unmet goals  . Recurrent UTI (urinary tract infection)     h/o hospitalization with urosepsis 2015, but thought large component colonization/bacteriuria, only treat if symptomatic ([place])  . Hiatal hernia   . Thrombocytopenia ([place])   . Protein calorie malnutrition ([place])   . On home oxygen therapy     "3L; 24/7" (11/29/2015)  . OSA (obstructive sleep apnea)     a. does not use CPAP. (01/25/2015)  . Hepatitis   . CAP (community acquired pneumonia) 2015   Past Surgical History  Procedure Laterality Date  . Hemorroidectomy    . Appendectomy    . Vaginal hysterectomy    . Breast biopsy      bilateral  . Cesarean section      x 3  . Appendectomy    . Esophagogastroduodenoscopy  06/12/2012    Procedure: ESOPHAGOGASTRODUODENOSCOPY (EGD);  Surgeon: Lafayette Dragon, MD;  Location: [facility];  Service: Endoscopy;  Laterality: N/A;  . Balloon dilation  06/12/2012    Procedure: BALLOON DILATION;  Surgeon: Lafayette Dragon, MD;  Location: [facility];  Service: Endoscopy;  Laterality: N/A;  ?balloon  . Esophagogastroduodenoscopy  09/10/2012    Procedure: ESOPHAGOGASTRODUODENOSCOPY (EGD);  Surgeon: Lafayette Dragon, MD;  Location: [facility];  Service: Endoscopy;  Laterality: N/A;  . Hot hemostasis  09/10/2012    Procedure: HOT HEMOSTASIS (ARGON PLASMA COAGULATION/BICAP);  Surgeon: Lafayette Dragon, MD;  Location: [facility];  Service: Endoscopy;  Laterality: N/A;  . Esophagogastroduodenoscopy N/A 01/18/2013    Procedure: ESOPHAGOGASTRODUODENOSCOPY (EGD);  Surgeon: Lafayette Dragon,  MD;  Location: [facility] ENDOSCOPY;  Service: Endoscopy;  Laterality: N/A;  . Esophagogastroduodenoscopy N/A 05/24/2013    Procedure: ESOPHAGOGASTRODUODENOSCOPY (EGD);  Surgeon: Jerene Bears, MD;  Location: [place];  Service: Gastroenterology;  Laterality: N/A;  . Hot hemostasis N/A 05/24/2013    Procedure: HOT HEMOSTASIS (ARGON PLASMA COAGULATION/BICAP);  Surgeon: Jerene Bears, MD;  Location: [place];  Service: Gastroenterology;  Laterality: N/A;  . US echocardiography  07/2014    mild LVH, EF 60-65%, normal wall motion, mild AR, mod dilated LA, mildly dilated RA, peak PA pressure 69mmHg  . Esophagogastroduodenoscopy N/A 10/20/2014    Procedure: ESOPHAGOGASTRODUODENOSCOPY (EGD);  Surgeon: Lafayette Dragon, MD;  Location: [facility];  Service: Endoscopy;  Laterality: N/A;  . Hot hemostasis N/A 10/20/2014    Procedure: HOT HEMOSTASIS (ARGON PLASMA COAGULATION/BICAP);  Surgeon: Lafayette Dragon, MD;  Location: [facility];  Service: Endoscopy;  Laterality: N/A;  . Enteroscopy N/A 02/25/2015    Procedure: ENTEROSCOPY;  Surgeon: [name] Ada, MD;  Location: [place];  Service: Endoscopy;  Laterality: N/A;  . Enteroscopy N/A 06/27/2015    Procedure: ENTEROSCOPY;  Surgeon: Manus Gunning, MD;  Location: [facility];  Service: Gastroenterology;  Laterality: N/A;  . Eus  09/2015    antral CAVL, ablated; nodular erosive gastritis; mult pancreatic cysts rec rpt CT pancreas protocol or MRI to survey pancreas cysts 1-2 yrs (Dr Amie Critchley)   Family History  Problem Relation Age of Onset  . Heart disease Mother   . Cervical cancer Mother   . Kidney disease Mother   . Diabetes Mother   . Breast cancer Sister   . Multiple sclerosis Sister   . Colon cancer Neg Hx   . Breast cancer Maternal Aunt     x 2  . Diabetes Father   . Diabetes Sister   . Multiple sclerosis Other   . Heart attack Mother   . Heart attack Father   . Stroke Daughter   . Stroke Daughter    Social History  Substance Use  Topics  . Smoking status: Former Smoker -- 1.50 packs/day for 25 years    Types: Cigarettes    Quit date: 08/26/1992  . Smokeless tobacco: Never Used  . Alcohol Use: No     Comment: [id number] "last alcoholic drink was years ago"   OB History    No data available     Review of Systems  All other systems reviewed and are negative.     Allergies  Acetaminophen; Nsaids; Aspirin; Theophylline; Penicillin g; and Penicillins  Home Medications   Prior to Admission medications   Medication Sig Start Date End Date Taking? Authorizing Provider  ADVAIR DISKUS 250-50 MCG/DOSE AEPB INHALE 1 PUFF INTO LUNGS TWICE A DAY 05/29/15  Yes Ria Bush, MD  albuterol (PROAIR HFA) 108 (90 BASE) MCG/ACT inhaler Inhale 2 puffs into the lungs every 6 (six) hours as needed for wheezing or shortness  of breath. Reported on 08/18/2015   Yes Historical Provider, MD  B Complex-C ([facility] B COMPLEX PLUS C) TABS TAKE ONE CAPSULE BY MOUTH EVERY DAY 11/16/15  Yes Ria Bush, MD  buPROPion (WELLBUTRIN XL) 150 MG 24 hr tablet Take 150 mg by mouth daily.   Yes Historical Provider, MD  desvenlafaxine (PRISTIQ) 50 MG 24 hr tablet Take 3 tablets (150 mg total) by mouth daily. 05/12/15  Yes Ria Bush, MD  ferrous sulfate 325 (65 FE) MG tablet Take 1 tablet (325 mg total) by mouth daily with breakfast. 06/15/15  Yes Manus Gunning, MD  glucose blood (ONE TOUCH ULTRA TEST) test strip Check blood sugar 4 times daily 10/16/15  Yes Ria Bush, MD  guaiFENesin (MUCINEX) 600 MG 12 hr tablet Take 600 mg by mouth 2 (two) times daily.    Yes Historical Provider, MD  Insulin Glargine (LANTUS SOLOSTAR) 100 UNIT/ML Solostar Pen Inject 120 Units into the skin every morning. 04/13/15  Yes Renato Shin, MD  insulin lispro (HUMALOG KWIKPEN) 100 UNIT/ML KiwkPen Inject 10-25 Units into the skin daily with supper.    Yes Historical Provider, MD  Insulin Pen Needle (B-D ULTRAFINE III SHORT PEN) 31G X 8 MM MISC Use as  directed to inject insulin twice daily. Dx: E11.8 10/13/15  Yes Renato Shin, MD  KLOR-CON M20 20 MEQ tablet TAKE 2 TABLETS EVERY DAY 11/20/15  Yes Ria Bush, MD  lactulose, encephalopathy, (CHRONULAC) 10 GM/15ML SOLN Take 20 g by mouth 3 (three) times daily.  11/21/15  Yes Ria Bush, MD  levalbuterol Penne Lash) 0.63 MG/3ML nebulizer solution Take 0.63 mg by nebulization every 8 (eight) hours as needed for wheezing or shortness of breath.  06/05/14  Yes Historical Provider, MD  loratadine (CLARITIN) 10 MG tablet TAKE 1 TABLET BY MOUTH EVERY DAY 07/14/15  Yes Ria Bush, MD  Multiple Vitamins-Minerals (CENTRUM SILVER PO) Take 1 tablet by mouth daily.    Yes Historical Provider, MD  mupirocin cream (BACTROBAN) 2 % Apply 1 application topically 2 (two) times daily. Reported on 11/14/2015   Yes Historical Provider, MD  oxycodone (OXY-IR) 5 MG capsule Take 1 capsule (5 mg total) by mouth every 6 (six) hours as needed for pain. 11/13/15  Yes Tonia Ghent, MD  pantoprazole (PROTONIX) 40 MG tablet TAKE 1 TABLET BY MOUTH TWICE A DAY 10/12/15  Yes Ria Bush, MD  promethazine (PHENERGAN) 25 MG tablet Take 25 mg by mouth 2 (two) times daily as needed for nausea or vomiting.   Yes Historical Provider, MD  QUEtiapine (SEROQUEL) 200 MG tablet TAKE 1 TABLET BY MOUTH AT BEDTIME 11/24/15  Yes Ria Bush, MD  rifaximin (XIFAXAN) 550 MG TABS tablet Take 550 mg by mouth 2 (two) times daily. Reported on 10/17/2015   Yes Historical Provider, MD  rOPINIRole (REQUIP) 4 MG tablet Take 4 mg by mouth at bedtime.   Yes Historical Provider, MD  simvastatin (ZOCOR) 40 MG tablet Take 40 mg by mouth daily.   Yes Historical Provider, MD  Vitamin D, Ergocalciferol, (DRISDOL) 50000 units CAPS capsule TAKE ONE CAPSULE WEEKLY Patient taking differently: TAKE ONE CAPSULE WEEKLY ON FRIDAY 10/23/15  Yes Ria Bush, MD  ALPRAZolam Duanne Moron) 1 MG tablet TAKE 1/2-1 TAB BY MOUTH 2 TIMES DAILY AS NEEDED FOR ANXIETY  OR SLEEP 12/21/15   Ria Bush, MD  guaiFENesin-codeine (CHERATUSSIN AC) 100-10 MG/5ML syrup Take 5 mLs by mouth 2 (two) times daily as needed for cough (sedation precautions). 12/13/15   Ria Bush, MD  isosorbide  mononitrate (IMDUR) 30 MG 24 hr tablet Take 0.5 tablets (15 mg total) by mouth daily. 12/19/15   Liliane Shi, PA-C  metolazone (ZAROXOLYN) 2.5 MG tablet TAKE AS DIRECTED FOR 4 LB WEIGHT GAIN 12/04/15   Sherren Mocha, MD  torsemide (DEMADEX) 20 MG tablet Take 2 tablets (40 mg total) by mouth daily. KEEP OV. 12/08/15   Sherren Mocha, MD   BP 150/48 mmHg  Pulse 61  Temp(Src) 98.3 F (36.8 C) (Oral)  Resp 16  Ht 5\' 2"  (1.575 m)  Wt 217 lb 1.6 oz (98.476 kg)  BMI 39.70 kg/m2  SpO2 98% Physical Exam  Constitutional: She is oriented to person, place, and time. She appears well-developed and well-nourished.  Elderly, frail  HENT:  Head: Normocephalic and atraumatic.  Right Ear: External ear normal.  Left Ear: External ear normal.  Eyes: Conjunctivae and EOM are normal. Pupils are equal, round, and reactive to light.  Neck: Normal range of motion and phonation normal. Neck supple.  Cardiovascular: Normal rate, regular rhythm and normal heart sounds.   Pulmonary/Chest: Effort normal and breath sounds normal. She exhibits no bony tenderness.  Decreased air movement bilaterally, scattered rhonchi, no wheezing.  Abdominal: Soft. There is no tenderness.  Musculoskeletal: Normal range of motion. She exhibits edema (1+ bilateral lower legs).  Neurological: She is alert and oriented to person, place, and time. No cranial nerve deficit or sensory deficit. She exhibits normal muscle tone. Coordination normal.  Skin: Skin is warm, dry and intact.  Psychiatric: She has a normal mood and affect. Her behavior is normal. Judgment and thought content normal.  Nursing note and vitals reviewed.   ED Course  Procedures (including critical care time) Labs Review Labs Reviewed  BASIC  METABOLIC PANEL - Abnormal; Notable for the following:    Chloride 99 (*)    Glucose, Bld 193 (*)    BUN 33 (*)    Creatinine, Ser 1.11 (*)    GFR calc non Af Amer 48 (*)    GFR calc Af Amer 56 (*)    All other components within normal limits  CBC WITH DIFFERENTIAL/PLATELET - Abnormal; Notable for the following:    RBC 2.93 (*)    Hemoglobin 8.9 (*)    HCT 28.5 (*)    RDW 16.7 (*)    Platelets 82 (*)    Lymphs Abs 0.2 (*)    All other components within normal limits  TROPONIN I - Abnormal; Notable for the following:    Troponin I 0.04 (*)    All other components within normal limits  URINALYSIS, ROUTINE W REFLEX MICROSCOPIC (NOT AT [facility]) - Abnormal; Notable for the following:    APPearance CLOUDY (*)    Hgb urine dipstick SMALL (*)    Protein, ur 100 (*)    Leukocytes, UA TRACE (*)    All other components within normal limits  URINE MICROSCOPIC-ADD ON - Abnormal; Notable for the following:    Squamous Epithelial / LPF 6-30 (*)    Bacteria, UA MANY (*)    All other components within normal limits  TROPONIN I - Abnormal; Notable for the following:    Troponin I 0.06 (*)    All other components within normal limits  HEMOGLOBIN A1C - Abnormal; Notable for the following:    Hgb A1c MFr Bld 6.9 (*)    All other components within normal limits  TROPONIN I - Abnormal; Notable for the following:    Troponin I 0.05 (*)    All  other components within normal limits  TROPONIN I - Abnormal; Notable for the following:    Troponin I 0.04 (*)    All other components within normal limits  BASIC METABOLIC PANEL - Abnormal; Notable for the following:    Chloride 100 (*)    Glucose, Bld 392 (*)    BUN 35 (*)    Creatinine, Ser 1.25 (*)    GFR calc non Af Amer 42 (*)    GFR calc Af Amer 49 (*)    All other components within normal limits  CBC - Abnormal; Notable for the following:    WBC 3.9 (*)    RBC 2.70 (*)    Hemoglobin 8.1 (*)    HCT 26.3 (*)    RDW 16.4 (*)    Platelets 71 (*)     All other components within normal limits  GLUCOSE, CAPILLARY - Abnormal; Notable for the following:    Glucose-Capillary 382 (*)    All other components within normal limits  GLUCOSE, CAPILLARY - Abnormal; Notable for the following:    Glucose-Capillary 399 (*)    All other components within normal limits  GLUCOSE, CAPILLARY - Abnormal; Notable for the following:    Glucose-Capillary 470 (*)    All other components within normal limits  GLUCOSE, CAPILLARY - Abnormal; Notable for the following:    Glucose-Capillary 426 (*)    All other components within normal limits  GLUCOSE, CAPILLARY - Abnormal; Notable for the following:    Glucose-Capillary 413 (*)    All other components within normal limits  GLUCOSE, CAPILLARY - Abnormal; Notable for the following:    Glucose-Capillary 259 (*)    All other components within normal limits  GLUCOSE, CAPILLARY - Abnormal; Notable for the following:    Glucose-Capillary 363 (*)    All other components within normal limits  GLUCOSE, CAPILLARY - Abnormal; Notable for the following:    Glucose-Capillary 289 (*)    All other components within normal limits  CBG MONITORING, ED - Abnormal; Notable for the following:    Glucose-Capillary 167 (*)    All other components within normal limits  URINE CULTURE  BRAIN NATRIURETIC PEPTIDE    Imaging Review No results found. I have personally reviewed and evaluated these images and lab results as part of my medical decision-making.   EKG Interpretation   Date/Time:  Tuesday November 28 2015 05:21:47 EDT Ventricular Rate:  87 PR Interval:  221 QRS Duration: 104 QT Interval:  378 QTC Calculation: 455 R Axis:   -34 Text Interpretation:  Sinus or ectopic atrial rhythm Prolonged PR interval  Left axis deviation Probable lateral infarct, old since last tracing no  significant change Confirmed by [facility]  MD, Azim Gillingham [id number]) on 11/28/2015  5:56:02 AM      MDM   Final diagnoses:  Dyspnea  UTI (lower  urinary tract infection)    Nonspecific shortness of breath. Doubt serious bacterial infection or metabolic instability.  Nursing Notes Reviewed/ Care Coordinated, and agree without changes. Applicable Imaging Reviewed.  Interpretation of Laboratory Data incorporated into ED treatment   Care to Dr. Wilson Singer to evaluate after return of labs and decide on disposition.    Daleen Bo, MD 12/24/15 [phone]

## 2015-12-25 ENCOUNTER — Encounter: Payer: Self-pay | Admitting: Family Medicine

## 2015-12-25 ENCOUNTER — Ambulatory Visit (INDEPENDENT_AMBULATORY_CARE_PROVIDER_SITE_OTHER): Payer: Medicare Other | Admitting: Family Medicine

## 2015-12-25 VITALS — BP 126/72 | HR 72 | Temp 98.2°F | Wt 214.8 lb

## 2015-12-25 DIAGNOSIS — I5032 Chronic diastolic (congestive) heart failure: Secondary | ICD-10-CM

## 2015-12-25 DIAGNOSIS — I10 Essential (primary) hypertension: Secondary | ICD-10-CM | POA: Diagnosis not present

## 2015-12-25 DIAGNOSIS — E1122 Type 2 diabetes mellitus with diabetic chronic kidney disease: Secondary | ICD-10-CM

## 2015-12-25 DIAGNOSIS — E118 Type 2 diabetes mellitus with unspecified complications: Secondary | ICD-10-CM

## 2015-12-25 DIAGNOSIS — F333 Major depressive disorder, recurrent, severe with psychotic symptoms: Secondary | ICD-10-CM

## 2015-12-25 DIAGNOSIS — N183 Chronic kidney disease, stage 3 (moderate): Secondary | ICD-10-CM

## 2015-12-25 DIAGNOSIS — I2510 Atherosclerotic heart disease of native coronary artery without angina pectoris: Secondary | ICD-10-CM | POA: Diagnosis not present

## 2015-12-25 DIAGNOSIS — K31819 Angiodysplasia of stomach and duodenum without bleeding: Secondary | ICD-10-CM

## 2015-12-25 DIAGNOSIS — Z515 Encounter for palliative care: Secondary | ICD-10-CM

## 2015-12-25 MED ORDER — OXYCODONE HCL 5 MG PO CAPS: 5.0000 mg | ORAL_CAPSULE | Freq: Four times a day (QID) | ORAL | Status: DC | PRN

## 2015-12-25 NOTE — Progress Notes (Signed)
Pre visit review using our clinic review tool, if applicable. No additional management support is needed unless otherwise documented below in the visit note. 

## 2015-12-25 NOTE — Assessment & Plan Note (Signed)
Appreciate care connections care of patient.

## 2015-12-25 NOTE — Assessment & Plan Note (Signed)
Chronic, stable. Good control in office today. Pt endorses A999333 systolic readings at home. No changes today. Tolerating low dose imdur.

## 2015-12-25 NOTE — Assessment & Plan Note (Signed)
Reviewed diuretic dosing. No metolazone use in last 1 week. She will take 1 tablet today. Goal dry weight 210-211 lbs

## 2015-12-25 NOTE — Patient Instructions (Signed)
Good to see you today, call us with quesitons. Return in 1 month for follow up No medicine changes for now. I will ask home nurse to draw fructosamine levels tomorrow.

## 2015-12-25 NOTE — Assessment & Plan Note (Signed)
Continue PPI BID 

## 2015-12-25 NOTE — Assessment & Plan Note (Signed)
imdur has helped chest discomfort. Appreciate cards care of patient.

## 2015-12-25 NOTE — Assessment & Plan Note (Signed)
Now off haldol. Continue pristiq, xanax, seroquel.

## 2015-12-25 NOTE — Progress Notes (Signed)
BP 126/72 mmHg  Pulse 72  Temp(Src) 98.2 F (36.8 C) (Oral)  Wt 214 lb 12 oz (97.41 kg)  SpO2 98% on 3L [place]  CC: 2 wk f/u visit  Subjective:    Patient ID: [name], female    DOB: 03-21-1941, 75 y.o.   MRN: [id number]  HPI: RANESSA PETRENKO is a 75 y.o. female presenting on 12/25/2015 for Follow-up   Last seen here 12/11/2015 after hospitalization for COPD exacerbation treated with levaquin and oral prednisone. Continued cough treated with codeine cough syrup. Ongoing cough with mucous production but feels breathing has stabilized.   Established with [facility] home based palliative care program. Had worsening skin crawling sensation treated temporarily with haldol PRN, now off med. Also regularly taking seroquel 200mg  nightly. Recent UA WNL.   Saw cardiology last week - atypical chest pain treated medically - imdur 15mg  started. Dry weight 210lbs. Not on blood thinners 2/2 bleeding (GAVE) - on protonix 40mg  bid. Latest Hgb 8.4. Known mild aortic stenosis. On torsemide 40mg  daily + metolazone prn weight gain. Last metolazone dose was last Monday.   Trouble with vision due to cataracts. Has decided not to pursue treatment. This acutely worsened last week. Trouble reading despite using readers. Occasional photopsia/floaters. Some halo around lights. No full transient vision loss. Last eye exam 09/2014.   Sugars running elevated 300-400s. This morning fasting 287. Diabetic regimen includes lantus 120u QAM + 10-25 units humalog with supper.   Due for: Fructosamine, BMP - to be drawn at home tomorrow. Lab Results  Component Value Date   HGBA1C 6.9* 11/28/2015    Relevant past medical, surgical, family and social history reviewed and updated as indicated. Interim medical history since our last visit reviewed. Allergies and medications reviewed and updated. Current Outpatient Prescriptions on File Prior to Visit  Medication Sig  . ADVAIR DISKUS 250-50 MCG/DOSE AEPB INHALE 1 PUFF  INTO LUNGS TWICE A DAY  . albuterol (PROAIR HFA) 108 (90 BASE) MCG/ACT inhaler Inhale 2 puffs into the lungs every 6 (six) hours as needed for wheezing or shortness of breath. Reported on 08/18/2015  . ALPRAZolam (XANAX) 1 MG tablet TAKE 1/2-1 TAB BY MOUTH 2 TIMES DAILY AS NEEDED FOR ANXIETY OR SLEEP  . B Complex-C ([employer]S B COMPLEX PLUS C) TABS TAKE ONE CAPSULE BY MOUTH EVERY DAY  . buPROPion (WELLBUTRIN XL) 150 MG 24 hr tablet Take 150 mg by mouth daily.  [name] desvenlafaxine (PRISTIQ) 50 MG 24 hr tablet Take 3 tablets (150 mg total) by mouth daily.  . ferrous sulfate 325 (65 FE) MG tablet Take 1 tablet (325 mg total) by mouth daily with breakfast.  . glucose blood (ONE TOUCH ULTRA TEST) test strip Check blood sugar 4 times daily  . guaiFENesin (MUCINEX) 600 MG 12 hr tablet Take 600 mg by mouth 2 (two) times daily.   [name] guaiFENesin-codeine (CHERATUSSIN AC) 100-10 MG/5ML syrup Take 5 mLs by mouth 2 (two) times daily as needed for cough (sedation precautions).  . Insulin Glargine (LANTUS SOLOSTAR) 100 UNIT/ML Solostar Pen Inject 120 Units into the skin every morning.  . insulin lispro (HUMALOG KWIKPEN) 100 UNIT/ML KiwkPen Inject 10-25 Units into the skin daily with supper.   . Insulin Pen Needle (B-D ULTRAFINE III SHORT PEN) 31G X 8 MM MISC Use as directed to inject insulin twice daily. Dx: E11.8  . isosorbide mononitrate (IMDUR) 30 MG 24 hr tablet Take 0.5 tablets (15 mg total) by mouth daily.  [name] KLOR-CON M20 20 MEQ  tablet TAKE 2 TABLETS EVERY DAY  . lactulose, encephalopathy, (CHRONULAC) 10 GM/15ML SOLN Take 20 g by mouth 3 (three) times daily.   [name] levalbuterol (XOPENEX) 0.63 MG/3ML nebulizer solution Take 0.63 mg by nebulization every 8 (eight) hours as needed for wheezing or shortness of breath.   . loratadine (CLARITIN) 10 MG tablet TAKE 1 TABLET BY MOUTH EVERY DAY  . metolazone (ZAROXOLYN) 2.5 MG tablet TAKE AS DIRECTED FOR 4 LB WEIGHT GAIN  . Multiple Vitamins-Minerals (CENTRUM SILVER PO) Take 1  tablet by mouth daily.   . mupirocin cream (BACTROBAN) 2 % Apply 1 application topically 2 (two) times daily. Reported on 11/14/2015  . pantoprazole (PROTONIX) 40 MG tablet TAKE 1 TABLET BY MOUTH TWICE A DAY  . promethazine (PHENERGAN) 25 MG tablet Take 25 mg by mouth 2 (two) times daily as needed for nausea or vomiting.  [name] QUEtiapine (SEROQUEL) 200 MG tablet TAKE 1 TABLET BY MOUTH AT BEDTIME  . rifaximin (XIFAXAN) 550 MG TABS tablet Take 550 mg by mouth 2 (two) times daily. Reported on 10/17/2015  . rOPINIRole (REQUIP) 4 MG tablet Take 4 mg by mouth at bedtime.  . simvastatin (ZOCOR) 40 MG tablet Take 40 mg by mouth daily.  [name] torsemide (DEMADEX) 20 MG tablet Take 2 tablets (40 mg total) by mouth daily. KEEP OV.  . Vitamin D, Ergocalciferol, (DRISDOL) 50000 units CAPS capsule TAKE ONE CAPSULE WEEKLY (Patient taking differently: TAKE ONE CAPSULE WEEKLY ON FRIDAY)   No current facility-administered medications on file prior to visit.    Review of Systems Per HPI unless specifically indicated in ROS section     Objective:    BP 126/72 mmHg  Pulse 72  Temp(Src) 98.2 F (36.8 C) (Oral)  Wt 214 lb 12 oz (97.41 kg)  SpO2 98%  Wt Readings from Last 3 Encounters:  12/25/15 214 lb 12 oz (97.41 kg)  12/19/15 219 lb (99.338 kg)  12/11/15 211 lb (95.709 kg)    Physical Exam  Constitutional: She appears well-developed and well-nourished. No distress.  HENT:  Mouth/Throat: Oropharynx is clear and moist. No oropharyngeal exudate.  Cardiovascular: Normal rate, regular rhythm and intact distal pulses.   Murmur (3/6 SEM best at LUSB) heard. Pulmonary/Chest: Effort normal and breath sounds normal. No respiratory distress. She has no wheezes. She has no rales.  Abdominal: Soft. She exhibits no mass. There is no tenderness. There is no guarding.  Musculoskeletal: She exhibits edema (mild nonpitting).  Skin: Skin is warm and dry. Rash noted.  Psychiatric: She has a normal mood and affect.  Nursing  note and vitals reviewed.  Results for orders placed or performed during the hospital encounter of 12/20/15  Hemoglobin-hemacue, POC  Result Value Ref Range   Hemoglobin 8.4 (L) 12.0 - 15.0 g/dL   *Note: Due to a large number of results and/or encounters for the requested time period, some results have not been displayed. A complete set of results can be found in Results Review.      Assessment & Plan:   Problem List Items Addressed This Visit    Severe recurrent major depressive disorder with psychotic features ([place]) - Primary    Now off haldol. Continue pristiq, xanax, seroquel.       Essential hypertension    Chronic, stable. Good control in office today. Pt endorses [id number] systolic readings at home. No changes today. Tolerating low dose imdur.      Coronary atherosclerosis    imdur has helped chest discomfort. Appreciate cards care of  patient.      GAVE (gastric antral vascular ectasia)    Continue PPI BID>      CKD stage 3 secondary to diabetes [facility])   Palliative care status    Appreciate [facility] care of patient.      Chronic diastolic heart failure ([place])    Reviewed diuretic dosing. No metolazone use in last 1 week. She will take 1 tablet today. Goal dry weight 210-211 lbs      Diabetes mellitus with complication ([place])    Reviewed likely inaccuracy of A1c with chronic anemia. I will ask home nurse to check fructosamine level with labs tomorrow at home and that way pt will have reading available for upcoming appt with Dr Loanne Drilling 5/17.          Follow up plan: Return in about 4 weeks (around 01/22/2016), or as needed, for follow up visit.  Ria Bush, MD

## 2015-12-25 NOTE — Assessment & Plan Note (Signed)
Reviewed likely inaccuracy of A1c with chronic anemia. I will ask home nurse to check fructosamine level with labs tomorrow at home and that way pt will have reading available for upcoming appt with Dr Loanne Drilling 5/17.

## 2015-12-26 DIAGNOSIS — J9611 Chronic respiratory failure with hypoxia: Secondary | ICD-10-CM | POA: Diagnosis not present

## 2015-12-26 DIAGNOSIS — I5032 Chronic diastolic (congestive) heart failure: Secondary | ICD-10-CM | POA: Diagnosis not present

## 2015-12-26 DIAGNOSIS — E1165 Type 2 diabetes mellitus with hyperglycemia: Secondary | ICD-10-CM | POA: Diagnosis not present

## 2015-12-26 DIAGNOSIS — J441 Chronic obstructive pulmonary disease with (acute) exacerbation: Secondary | ICD-10-CM | POA: Diagnosis not present

## 2015-12-26 DIAGNOSIS — E1122 Type 2 diabetes mellitus with diabetic chronic kidney disease: Secondary | ICD-10-CM | POA: Diagnosis not present

## 2015-12-26 DIAGNOSIS — I350 Nonrheumatic aortic (valve) stenosis: Secondary | ICD-10-CM | POA: Diagnosis not present

## 2015-12-26 DIAGNOSIS — N183 Chronic kidney disease, stage 3 (moderate): Secondary | ICD-10-CM | POA: Diagnosis not present

## 2015-12-26 DIAGNOSIS — I2510 Atherosclerotic heart disease of native coronary artery without angina pectoris: Secondary | ICD-10-CM | POA: Diagnosis not present

## 2015-12-26 DIAGNOSIS — K7469 Other cirrhosis of liver: Secondary | ICD-10-CM | POA: Diagnosis not present

## 2015-12-26 DIAGNOSIS — D61818 Other pancytopenia: Secondary | ICD-10-CM | POA: Diagnosis not present

## 2015-12-26 DIAGNOSIS — K7581 Nonalcoholic steatohepatitis (NASH): Secondary | ICD-10-CM | POA: Diagnosis not present

## 2015-12-26 DIAGNOSIS — E1142 Type 2 diabetes mellitus with diabetic polyneuropathy: Secondary | ICD-10-CM | POA: Diagnosis not present

## 2015-12-26 DIAGNOSIS — I130 Hypertensive heart and chronic kidney disease with heart failure and stage 1 through stage 4 chronic kidney disease, or unspecified chronic kidney disease: Secondary | ICD-10-CM | POA: Diagnosis not present

## 2015-12-26 LAB — BASIC METABOLIC PANEL
BUN: 44 mg/dL — AB (ref 4–21)
Creat: 1.19
GFR CALC NON AF AMER: 45
GLUCOSE: 243
Potassium: 3.5 mmol/L
Sodium: 141

## 2015-12-27 ENCOUNTER — Telehealth: Payer: Self-pay | Admitting: Family Medicine

## 2015-12-27 ENCOUNTER — Encounter: Payer: Self-pay | Admitting: Cardiovascular Disease

## 2015-12-27 NOTE — Telephone Encounter (Signed)
[name] from [place] called regarding labs - pt has  BUN of 44 gentiva has faxed over labs  cb number is [phone] Thank you

## 2015-12-27 NOTE — Telephone Encounter (Signed)
Results look to already be in chart from yesterday.

## 2015-12-28 DIAGNOSIS — E1165 Type 2 diabetes mellitus with hyperglycemia: Secondary | ICD-10-CM | POA: Diagnosis not present

## 2015-12-28 DIAGNOSIS — I2510 Atherosclerotic heart disease of native coronary artery without angina pectoris: Secondary | ICD-10-CM | POA: Diagnosis not present

## 2015-12-28 DIAGNOSIS — I350 Nonrheumatic aortic (valve) stenosis: Secondary | ICD-10-CM | POA: Diagnosis not present

## 2015-12-28 DIAGNOSIS — K7581 Nonalcoholic steatohepatitis (NASH): Secondary | ICD-10-CM | POA: Diagnosis not present

## 2015-12-28 DIAGNOSIS — I130 Hypertensive heart and chronic kidney disease with heart failure and stage 1 through stage 4 chronic kidney disease, or unspecified chronic kidney disease: Secondary | ICD-10-CM | POA: Diagnosis not present

## 2015-12-28 DIAGNOSIS — D61818 Other pancytopenia: Secondary | ICD-10-CM | POA: Diagnosis not present

## 2015-12-28 DIAGNOSIS — E1122 Type 2 diabetes mellitus with diabetic chronic kidney disease: Secondary | ICD-10-CM

## 2015-12-28 DIAGNOSIS — N183 Chronic kidney disease, stage 3 (moderate): Secondary | ICD-10-CM | POA: Diagnosis not present

## 2015-12-28 DIAGNOSIS — E1142 Type 2 diabetes mellitus with diabetic polyneuropathy: Secondary | ICD-10-CM | POA: Diagnosis not present

## 2015-12-28 DIAGNOSIS — J9611 Chronic respiratory failure with hypoxia: Secondary | ICD-10-CM | POA: Diagnosis not present

## 2015-12-28 DIAGNOSIS — J441 Chronic obstructive pulmonary disease with (acute) exacerbation: Secondary | ICD-10-CM | POA: Diagnosis not present

## 2015-12-28 DIAGNOSIS — I5032 Chronic diastolic (congestive) heart failure: Secondary | ICD-10-CM

## 2015-12-28 DIAGNOSIS — K7469 Other cirrhosis of liver: Secondary | ICD-10-CM | POA: Diagnosis not present

## 2015-12-29 ENCOUNTER — Telehealth: Payer: Self-pay | Admitting: *Deleted

## 2015-12-29 NOTE — Telephone Encounter (Signed)
Pt notified of lab results by phone with verbal understanding.  

## 2015-12-29 NOTE — Telephone Encounter (Signed)
Noted. will ask to input into chart.

## 2015-12-30 ENCOUNTER — Other Ambulatory Visit: Payer: Self-pay | Admitting: Cardiovascular Disease

## 2016-01-01 DIAGNOSIS — E1122 Type 2 diabetes mellitus with diabetic chronic kidney disease: Secondary | ICD-10-CM | POA: Diagnosis not present

## 2016-01-01 DIAGNOSIS — J441 Chronic obstructive pulmonary disease with (acute) exacerbation: Secondary | ICD-10-CM | POA: Diagnosis not present

## 2016-01-01 DIAGNOSIS — D61818 Other pancytopenia: Secondary | ICD-10-CM | POA: Diagnosis not present

## 2016-01-01 DIAGNOSIS — K7581 Nonalcoholic steatohepatitis (NASH): Secondary | ICD-10-CM | POA: Diagnosis not present

## 2016-01-01 DIAGNOSIS — I2510 Atherosclerotic heart disease of native coronary artery without angina pectoris: Secondary | ICD-10-CM | POA: Diagnosis not present

## 2016-01-01 DIAGNOSIS — I130 Hypertensive heart and chronic kidney disease with heart failure and stage 1 through stage 4 chronic kidney disease, or unspecified chronic kidney disease: Secondary | ICD-10-CM | POA: Diagnosis not present

## 2016-01-01 DIAGNOSIS — J9611 Chronic respiratory failure with hypoxia: Secondary | ICD-10-CM | POA: Diagnosis not present

## 2016-01-01 DIAGNOSIS — E1165 Type 2 diabetes mellitus with hyperglycemia: Secondary | ICD-10-CM | POA: Diagnosis not present

## 2016-01-01 DIAGNOSIS — I350 Nonrheumatic aortic (valve) stenosis: Secondary | ICD-10-CM | POA: Diagnosis not present

## 2016-01-01 DIAGNOSIS — N183 Chronic kidney disease, stage 3 (moderate): Secondary | ICD-10-CM | POA: Diagnosis not present

## 2016-01-01 DIAGNOSIS — E1142 Type 2 diabetes mellitus with diabetic polyneuropathy: Secondary | ICD-10-CM | POA: Diagnosis not present

## 2016-01-01 DIAGNOSIS — K7469 Other cirrhosis of liver: Secondary | ICD-10-CM | POA: Diagnosis not present

## 2016-01-01 DIAGNOSIS — I5032 Chronic diastolic (congestive) heart failure: Secondary | ICD-10-CM | POA: Diagnosis not present

## 2016-01-02 ENCOUNTER — Telehealth: Payer: Self-pay | Admitting: *Deleted

## 2016-01-02 DIAGNOSIS — J449 Chronic obstructive pulmonary disease, unspecified: Secondary | ICD-10-CM | POA: Diagnosis not present

## 2016-01-02 DIAGNOSIS — J441 Chronic obstructive pulmonary disease with (acute) exacerbation: Secondary | ICD-10-CM | POA: Diagnosis not present

## 2016-01-02 DIAGNOSIS — J9611 Chronic respiratory failure with hypoxia: Secondary | ICD-10-CM | POA: Diagnosis not present

## 2016-01-02 DIAGNOSIS — K7469 Other cirrhosis of liver: Secondary | ICD-10-CM | POA: Diagnosis not present

## 2016-01-02 DIAGNOSIS — K7581 Nonalcoholic steatohepatitis (NASH): Secondary | ICD-10-CM | POA: Diagnosis not present

## 2016-01-02 DIAGNOSIS — I350 Nonrheumatic aortic (valve) stenosis: Secondary | ICD-10-CM | POA: Diagnosis not present

## 2016-01-02 DIAGNOSIS — I130 Hypertensive heart and chronic kidney disease with heart failure and stage 1 through stage 4 chronic kidney disease, or unspecified chronic kidney disease: Secondary | ICD-10-CM | POA: Diagnosis not present

## 2016-01-02 DIAGNOSIS — E1142 Type 2 diabetes mellitus with diabetic polyneuropathy: Secondary | ICD-10-CM | POA: Diagnosis not present

## 2016-01-02 DIAGNOSIS — I2510 Atherosclerotic heart disease of native coronary artery without angina pectoris: Secondary | ICD-10-CM | POA: Diagnosis not present

## 2016-01-02 DIAGNOSIS — E1122 Type 2 diabetes mellitus with diabetic chronic kidney disease: Secondary | ICD-10-CM | POA: Diagnosis not present

## 2016-01-02 DIAGNOSIS — I5032 Chronic diastolic (congestive) heart failure: Secondary | ICD-10-CM | POA: Diagnosis not present

## 2016-01-02 DIAGNOSIS — N183 Chronic kidney disease, stage 3 (moderate): Secondary | ICD-10-CM | POA: Diagnosis not present

## 2016-01-02 DIAGNOSIS — E1165 Type 2 diabetes mellitus with hyperglycemia: Secondary | ICD-10-CM | POA: Diagnosis not present

## 2016-01-02 DIAGNOSIS — D61818 Other pancytopenia: Secondary | ICD-10-CM | POA: Diagnosis not present

## 2016-01-02 NOTE — Telephone Encounter (Signed)
Order faxed to [facility].

## 2016-01-02 NOTE — Telephone Encounter (Signed)
Updated order for aranesp in your IN box for completion. Appt tomorrow at [facility]. Please return to me. Thanks!

## 2016-01-02 NOTE — Telephone Encounter (Signed)
Signed and placed in Kim's box. 

## 2016-01-03 ENCOUNTER — Encounter: Payer: Self-pay | Admitting: *Deleted

## 2016-01-03 ENCOUNTER — Encounter (HOSPITAL_COMMUNITY)
Admission: RE | Admit: 2016-01-03 | Discharge: 2016-01-03 | Disposition: A | Discharge status: Home / Self Care | Payer: Medicare Other | Source: Ambulatory Visit | Attending: Pulmonary Disease | Admitting: Pulmonary Disease

## 2016-01-03 DIAGNOSIS — K7460 Unspecified cirrhosis of liver: Secondary | ICD-10-CM | POA: Diagnosis not present

## 2016-01-03 DIAGNOSIS — D649 Anemia, unspecified: Secondary | ICD-10-CM | POA: Diagnosis not present

## 2016-01-03 DIAGNOSIS — N289 Disorder of kidney and ureter, unspecified: Secondary | ICD-10-CM | POA: Diagnosis not present

## 2016-01-03 DIAGNOSIS — Q2733 Arteriovenous malformation of digestive system vessel: Secondary | ICD-10-CM | POA: Diagnosis not present

## 2016-01-03 LAB — POCT HEMOGLOBIN-HEMACUE: Hemoglobin: 9 g/dL — ABNORMAL LOW (ref 12.0–15.0)

## 2016-01-03 MED ORDER — DARBEPOETIN ALFA 200 MCG/0.4ML IJ SOSY
PREFILLED_SYRINGE | INTRAMUSCULAR | Status: DC
Start: 2016-01-03 — End: 2016-01-04
  Filled 2016-01-03: qty 0.4

## 2016-01-03 MED ORDER — DARBEPOETIN ALFA 200 MCG/0.4ML IJ SOSY
200.0000 ug | PREFILLED_SYRINGE | INTRAMUSCULAR | Status: DC
  Administered 2016-01-03: 200 ug via SUBCUTANEOUS

## 2016-01-04 ENCOUNTER — Encounter: Payer: Self-pay | Admitting: *Deleted

## 2016-01-04 DIAGNOSIS — I5032 Chronic diastolic (congestive) heart failure: Secondary | ICD-10-CM | POA: Diagnosis not present

## 2016-01-04 DIAGNOSIS — E1142 Type 2 diabetes mellitus with diabetic polyneuropathy: Secondary | ICD-10-CM | POA: Diagnosis not present

## 2016-01-04 DIAGNOSIS — E1122 Type 2 diabetes mellitus with diabetic chronic kidney disease: Secondary | ICD-10-CM | POA: Diagnosis not present

## 2016-01-04 DIAGNOSIS — J9611 Chronic respiratory failure with hypoxia: Secondary | ICD-10-CM | POA: Diagnosis not present

## 2016-01-04 DIAGNOSIS — K7469 Other cirrhosis of liver: Secondary | ICD-10-CM | POA: Diagnosis not present

## 2016-01-04 DIAGNOSIS — I350 Nonrheumatic aortic (valve) stenosis: Secondary | ICD-10-CM | POA: Diagnosis not present

## 2016-01-04 DIAGNOSIS — J441 Chronic obstructive pulmonary disease with (acute) exacerbation: Secondary | ICD-10-CM | POA: Diagnosis not present

## 2016-01-04 DIAGNOSIS — N183 Chronic kidney disease, stage 3 (moderate): Secondary | ICD-10-CM | POA: Diagnosis not present

## 2016-01-04 DIAGNOSIS — E1165 Type 2 diabetes mellitus with hyperglycemia: Secondary | ICD-10-CM | POA: Diagnosis not present

## 2016-01-04 DIAGNOSIS — D61818 Other pancytopenia: Secondary | ICD-10-CM | POA: Diagnosis not present

## 2016-01-04 DIAGNOSIS — K7581 Nonalcoholic steatohepatitis (NASH): Secondary | ICD-10-CM | POA: Diagnosis not present

## 2016-01-04 DIAGNOSIS — I2510 Atherosclerotic heart disease of native coronary artery without angina pectoris: Secondary | ICD-10-CM | POA: Diagnosis not present

## 2016-01-04 DIAGNOSIS — I130 Hypertensive heart and chronic kidney disease with heart failure and stage 1 through stage 4 chronic kidney disease, or unspecified chronic kidney disease: Secondary | ICD-10-CM | POA: Diagnosis not present

## 2016-01-05 DIAGNOSIS — N3020 Other chronic cystitis without hematuria: Secondary | ICD-10-CM | POA: Diagnosis not present

## 2016-01-06 DIAGNOSIS — J449 Chronic obstructive pulmonary disease, unspecified: Secondary | ICD-10-CM | POA: Diagnosis not present

## 2016-01-07 ENCOUNTER — Other Ambulatory Visit: Payer: Self-pay | Admitting: Gastroenterology

## 2016-01-08 ENCOUNTER — Other Ambulatory Visit: Payer: Self-pay | Admitting: Physician Assistant

## 2016-01-08 NOTE — Telephone Encounter (Signed)
REFILL 

## 2016-01-09 ENCOUNTER — Telehealth: Payer: Self-pay | Admitting: *Deleted

## 2016-01-09 DIAGNOSIS — J441 Chronic obstructive pulmonary disease with (acute) exacerbation: Secondary | ICD-10-CM | POA: Diagnosis not present

## 2016-01-09 DIAGNOSIS — D61818 Other pancytopenia: Secondary | ICD-10-CM | POA: Diagnosis not present

## 2016-01-09 DIAGNOSIS — I350 Nonrheumatic aortic (valve) stenosis: Secondary | ICD-10-CM | POA: Diagnosis not present

## 2016-01-09 DIAGNOSIS — I5032 Chronic diastolic (congestive) heart failure: Secondary | ICD-10-CM | POA: Diagnosis not present

## 2016-01-09 DIAGNOSIS — K7581 Nonalcoholic steatohepatitis (NASH): Secondary | ICD-10-CM | POA: Diagnosis not present

## 2016-01-09 DIAGNOSIS — I130 Hypertensive heart and chronic kidney disease with heart failure and stage 1 through stage 4 chronic kidney disease, or unspecified chronic kidney disease: Secondary | ICD-10-CM | POA: Diagnosis not present

## 2016-01-09 DIAGNOSIS — N183 Chronic kidney disease, stage 3 (moderate): Secondary | ICD-10-CM | POA: Diagnosis not present

## 2016-01-09 DIAGNOSIS — K7469 Other cirrhosis of liver: Secondary | ICD-10-CM | POA: Diagnosis not present

## 2016-01-09 DIAGNOSIS — I2510 Atherosclerotic heart disease of native coronary artery without angina pectoris: Secondary | ICD-10-CM | POA: Diagnosis not present

## 2016-01-09 DIAGNOSIS — E1165 Type 2 diabetes mellitus with hyperglycemia: Secondary | ICD-10-CM | POA: Diagnosis not present

## 2016-01-09 DIAGNOSIS — E1122 Type 2 diabetes mellitus with diabetic chronic kidney disease: Secondary | ICD-10-CM | POA: Diagnosis not present

## 2016-01-09 DIAGNOSIS — J9611 Chronic respiratory failure with hypoxia: Secondary | ICD-10-CM | POA: Diagnosis not present

## 2016-01-09 DIAGNOSIS — E1142 Type 2 diabetes mellitus with diabetic polyneuropathy: Secondary | ICD-10-CM | POA: Diagnosis not present

## 2016-01-09 NOTE — Telephone Encounter (Signed)
Legrand Como RN with [facility] Left voicemail at Triage. Pt is having pain in her sides and in lower rib area. Pt said last time she had this type of pain she had broken ribs. Pt wants to either be seen ASAP or come in for an xray so we can "get her some relief" as soon as possible. Legrand Como request that we call pt and let her know what Dr. Darnell Level recommends.

## 2016-01-09 NOTE — Telephone Encounter (Signed)
Spoke with patient. She said no falls or trauma since December. The oxy does help, but says when pain hits, it is like a "catch" and the only thing that seems to help is to stand up until it subsides. It is on both sides and feels like it is under her lungs. Appt scheduled.

## 2016-01-09 NOTE — Telephone Encounter (Signed)
Any recent fall/trauma/injury? If not, likely not recurrent fracture.  Has oxycodone IR helped at all? Would offer next avail appt with me.

## 2016-01-10 ENCOUNTER — Ambulatory Visit (INDEPENDENT_AMBULATORY_CARE_PROVIDER_SITE_OTHER): Payer: Medicare Other | Admitting: Endocrinology

## 2016-01-10 ENCOUNTER — Encounter: Payer: Self-pay | Admitting: Endocrinology

## 2016-01-10 ENCOUNTER — Telehealth: Payer: Self-pay | Admitting: *Deleted

## 2016-01-10 VITALS — BP 130/62 | HR 73 | Wt 213.0 lb

## 2016-01-10 DIAGNOSIS — N183 Chronic kidney disease, stage 3 (moderate): Secondary | ICD-10-CM

## 2016-01-10 DIAGNOSIS — E1142 Type 2 diabetes mellitus with diabetic polyneuropathy: Secondary | ICD-10-CM | POA: Diagnosis not present

## 2016-01-10 DIAGNOSIS — Z794 Long term (current) use of insulin: Secondary | ICD-10-CM | POA: Diagnosis not present

## 2016-01-10 DIAGNOSIS — I5032 Chronic diastolic (congestive) heart failure: Secondary | ICD-10-CM | POA: Diagnosis not present

## 2016-01-10 DIAGNOSIS — I130 Hypertensive heart and chronic kidney disease with heart failure and stage 1 through stage 4 chronic kidney disease, or unspecified chronic kidney disease: Secondary | ICD-10-CM | POA: Diagnosis not present

## 2016-01-10 DIAGNOSIS — E1122 Type 2 diabetes mellitus with diabetic chronic kidney disease: Secondary | ICD-10-CM

## 2016-01-10 DIAGNOSIS — K7581 Nonalcoholic steatohepatitis (NASH): Secondary | ICD-10-CM | POA: Diagnosis not present

## 2016-01-10 DIAGNOSIS — J9611 Chronic respiratory failure with hypoxia: Secondary | ICD-10-CM | POA: Diagnosis not present

## 2016-01-10 DIAGNOSIS — I350 Nonrheumatic aortic (valve) stenosis: Secondary | ICD-10-CM | POA: Diagnosis not present

## 2016-01-10 DIAGNOSIS — J441 Chronic obstructive pulmonary disease with (acute) exacerbation: Secondary | ICD-10-CM | POA: Diagnosis not present

## 2016-01-10 DIAGNOSIS — D61818 Other pancytopenia: Secondary | ICD-10-CM | POA: Diagnosis not present

## 2016-01-10 DIAGNOSIS — I2510 Atherosclerotic heart disease of native coronary artery without angina pectoris: Secondary | ICD-10-CM | POA: Diagnosis not present

## 2016-01-10 DIAGNOSIS — K7469 Other cirrhosis of liver: Secondary | ICD-10-CM | POA: Diagnosis not present

## 2016-01-10 DIAGNOSIS — E1165 Type 2 diabetes mellitus with hyperglycemia: Secondary | ICD-10-CM | POA: Diagnosis not present

## 2016-01-10 NOTE — Telephone Encounter (Signed)
Left detailed message.   

## 2016-01-10 NOTE — Patient Instructions (Addendum)
Please continue the same lantus.   Take the humalog just 5 units for any blood sugar over 300.   Please call if you go back on steroids, so we can carefully follow your blood sugar then.  check your blood sugar twice a day.  vary the time of day when you check, between before the 3 meals, and at bedtime.  also check if you have symptoms of your blood sugar being too high or too low.  please keep a record of the readings and bring it to your next appointment here.  You can write it on any piece of paper.  please call us sooner if your blood sugar goes below 70, or if you have a lot of readings over 200. Please come back for a follow-up appointment in 4 months.

## 2016-01-10 NOTE — Telephone Encounter (Signed)
Ok to do thanks. 

## 2016-01-10 NOTE — Telephone Encounter (Signed)
[name] with [facility] contacted office requesting to verbal order extend pts PT for twice weekly x2wks. Advised Dr Darnell Level is out of office the remainder of the day. pls advise

## 2016-01-10 NOTE — Progress Notes (Signed)
Subjective:    Patient ID: [name], female    DOB: 1940/11/10, 75 y.o.   MRN: [id number]  HPI Pt returns for f/u of diabetes mellitus: DM type: Insulin-requiring type 2 Dx'ed: [id number] Complications: polyneuropathy, foot ulcer, renal insufficiency, and CAD. Therapy: insulin since 2010 GDM: never.  DKA: never.  Severe hypoglycemia: never.  Pancreatitis: never.  Other: she needs a simple insulin regimen, due to low functional state, but she takes her own insulin.  Interval history: Meter is downloaded today, and the printout is scanned into the record.  It varies from 100-500.  It is in general lowest at hs.  She was in the hospital 1 month ago, and was rx'ed steroids for resp failure.   Past Medical History  Diagnosis Date  . Chronic airway obstruction, not elsewhere classified   . Unspecified chronic bronchitis ([place])   . Unspecified essential hypertension   . Non-obstructive CAD   . Palpitations   . Mild aortic stenosis     a. 03/2014 Valve area (VTI): 2.89 cm^2, Valve area (Vmax): 2.7 cm^2.  . Pure hypercholesterolemia   . Morbid obesity ([place])   . Esophageal reflux   . GAVE (gastric antral vascular ectasia)     angiodysplasia; s/p multiple APCs, discussing RFA with [facility] Dr Newman Pies (06/2015)  . Diverticulosis of colon (without mention of hemorrhage)   . Benign neoplasm of colon   . Osteoarthrosis, unspecified whether generalized or localized, unspecified site   . Osteoporosis, unspecified   . Restless legs syndrome (RLS)   . Major depressive disorder, recurrent episode, severe, specified as with psychotic behavior   . Iron deficiency anemia secondary to blood loss (chronic)     a. frequent PRBC transfusions.  . Coarse tremors     a. arms.  . Carpal tunnel syndrome on both sides   . Chronic diastolic CHF (congestive heart failure) ([place])     a. 03/26/2014 Echo: EF 55-60%, no rwma, mild AS/AI, mod-sev Ca2+ MV annulus, mildly to mod dil LA.  [name] Asthma   . Type II  diabetes mellitus ([place])   . H/O hiatal hernia   . 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 dx'd 1990's  . Adenomatous colon polyp   . Midsternal chest pain     a. conservatively managed ->poor candidate for cath/anticoagulation.  [name] Anxiety   . Portal hypertensive gastropathy   . Falls frequently     completed HHPT/OT 06/2014, PT unmet goals  . Recurrent UTI (urinary tract infection)     h/o hospitalization with urosepsis 2015, but thought large component colonization/bacteriuria, only treat if symptomatic ([place])  . Hiatal hernia   . Thrombocytopenia ([place])   . Protein calorie malnutrition ([place])   . On home oxygen therapy     "3L; 24/7" (11/29/2015)  . OSA (obstructive sleep apnea)     a. does not use CPAP. (01/25/2015)  . Hepatitis   . CAP (community acquired pneumonia) 2015    Past Surgical History  Procedure Laterality Date  . Hemorroidectomy    . Appendectomy    . Vaginal hysterectomy    . Breast biopsy      bilateral  . Cesarean section      x 3  . Appendectomy    . Esophagogastroduodenoscopy  06/12/2012    Procedure: ESOPHAGOGASTRODUODENOSCOPY (EGD);  Surgeon: Lafayette Dragon, MD;  Location: [facility];  Service: Endoscopy;  Laterality: N/A;  . Balloon dilation  06/12/2012    Procedure:  BALLOON DILATION;  Surgeon: Lafayette Dragon, MD;  Location: [facility];  Service: Endoscopy;  Laterality: N/A;  ?balloon  . Esophagogastroduodenoscopy  09/10/2012    Procedure: ESOPHAGOGASTRODUODENOSCOPY (EGD);  Surgeon: Lafayette Dragon, MD;  Location: [facility];  Service: Endoscopy;  Laterality: N/A;  . Hot hemostasis  09/10/2012    Procedure: HOT HEMOSTASIS (ARGON PLASMA COAGULATION/BICAP);  Surgeon: Lafayette Dragon, MD;  Location: [facility];  Service: Endoscopy;  Laterality: N/A;  . Esophagogastroduodenoscopy N/A 01/18/2013    Procedure: ESOPHAGOGASTRODUODENOSCOPY (EGD);  Surgeon: Lafayette Dragon, MD;  Location[facility];  Service: Endoscopy;  Laterality:  N/A;  . Esophagogastroduodenoscopy N/A 05/24/2013    Procedure: ESOPHAGOGASTRODUODENOSCOPY (EGD);  Surgeon: Jerene Bears, MD;  Location: [place];  Service: Gastroenterology;  Laterality: N/A;  . Hot hemostasis N/A 05/24/2013    Procedure: HOT HEMOSTASIS (ARGON PLASMA COAGULATION/BICAP);  Surgeon: Jerene Bears, MD;  Location: [place];  Service: Gastroenterology;  Laterality: N/A;  . US echocardiography  07/2014    mild LVH, EF 60-65%, normal wall motion, mild AR, mod dilated LA, mildly dilated RA, peak PA pressure 77mmHg  . Esophagogastroduodenoscopy N/A 10/20/2014    Procedure: ESOPHAGOGASTRODUODENOSCOPY (EGD);  Surgeon: Lafayette Dragon, MD;  Location: [facility];  Service: Endoscopy;  Laterality: N/A;  . Hot hemostasis N/A 10/20/2014    Procedure: HOT HEMOSTASIS (ARGON PLASMA COAGULATION/BICAP);  Surgeon: Lafayette Dragon, MD;  Location: [facility];  Service: Endoscopy;  Laterality: N/A;  . Enteroscopy N/A 02/25/2015    Procedure: ENTEROSCOPY;  Surgeon: [name] Ada, MD;  Location: [place];  Service: Endoscopy;  Laterality: N/A;  . Enteroscopy N/A 06/27/2015    Procedure: ENTEROSCOPY;  Surgeon: Manus Gunning, MD;  Location: [facility];  Service: Gastroenterology;  Laterality: N/A;  . Eus  09/2015    antral CAVL, ablated; nodular erosive gastritis; mult pancreatic cysts rec rpt CT pancreas protocol or MRI to survey pancreas cysts 1-2 yrs (Dr Amie Critchley)    Social History   Social History  . Marital Status: Married    Spouse Name: jerry Lovett  . Number of Children: 4  . Years of Education: 12   Occupational History  . Disabled   .     Social History Main Topics  . Smoking status: Former Smoker -- 1.50 packs/day for 25 years    Types: Cigarettes    Quit date: 08/26/1992  . Smokeless tobacco: Never Used  . Alcohol Use: No     Comment: [id number] "last alcoholic drink was years ago"  . Drug Use: No  . Sexual Activity: No   Other Topics Concern  . Not on file    Social History Narrative   Patient lives at home with her husband [name]).    Retired.   Caffeine- None    Right handed.      Dr. Olevia Perches now [facility] (referred to [facility] at [facility])   Dr. Burt Knack cards    Dr. Loanne Drilling endo   Dr Lenna Gilford pulm   Psych: Followed by [facility] (ph [phone], fax [phone]).     Current Outpatient Prescriptions on File Prior to Visit  Medication Sig Dispense Refill  . ADVAIR DISKUS 250-50 MCG/DOSE AEPB INHALE 1 PUFF INTO LUNGS TWICE A DAY 60 each 11  . albuterol (PROAIR HFA) 108 (90 BASE) MCG/ACT inhaler Inhale 2 puffs into the lungs every 6 (six) hours as needed for wheezing or shortness of breath. Reported on 08/18/2015    . ALPRAZolam (XANAX) 1 MG tablet TAKE 1/2-1 TAB  BY MOUTH 2 TIMES DAILY AS NEEDED FOR ANXIETY OR SLEEP 60 tablet 0  . B Complex-C (CVS B COMPLEX PLUS C) TABS TAKE ONE CAPSULE BY MOUTH EVERY DAY 30 tablet 6  . buPROPion (WELLBUTRIN XL) 150 MG 24 hr tablet Take 150 mg by mouth daily.    [name] desvenlafaxine (PRISTIQ) 50 MG 24 hr tablet Take 3 tablets (150 mg total) by mouth daily. 90 tablet 11  . ferrous sulfate 325 (65 FE) MG tablet TAKE 1 TABLET BY MOUTH EVERY DAY WITH BREAKFAST 90 tablet 1  . glucose blood (ONE TOUCH ULTRA TEST) test strip Check blood sugar 4 times daily 200 each 3  . guaiFENesin (MUCINEX) 600 MG 12 hr tablet Take 600 mg by mouth 2 (two) times daily.     [name] guaiFENesin-codeine (CHERATUSSIN AC) 100-10 MG/5ML syrup Take 5 mLs by mouth 2 (two) times daily as needed for cough (sedation precautions). 140 mL 0  . Insulin Glargine (LANTUS SOLOSTAR) 100 UNIT/ML Solostar Pen Inject 120 Units into the skin every morning. 20 pen 11  . insulin lispro (HUMALOG KWIKPEN) 100 UNIT/ML KiwkPen Inject 5 Units into the skin 3 (three) times daily with meals. Take only if blood sugar is over 300    . Insulin Pen Needle (B-D ULTRAFINE III SHORT PEN) 31G X 8 MM MISC Use as directed to inject insulin twice daily. Dx: E11.8 200 each 2  .  isosorbide mononitrate (IMDUR) 30 MG 24 hr tablet Take 0.5 tablets (15 mg total) by mouth daily. 30 tablet 6  . KLOR-CON M20 20 MEQ tablet TAKE 2 TABLETS EVERY DAY 30 tablet 6  . lactulose, encephalopathy, (CHRONULAC) 10 GM/15ML SOLN Take 20 g by mouth 3 (three) times daily.     [name] levalbuterol (XOPENEX) 0.63 MG/3ML nebulizer solution Take 0.63 mg by nebulization every 8 (eight) hours as needed for wheezing or shortness of breath.   5  . loratadine (CLARITIN) 10 MG tablet TAKE 1 TABLET BY MOUTH EVERY DAY 30 tablet 11  . metolazone (ZAROXOLYN) 2.5 MG tablet TAKE AS DIRECTED FOR 4 LB WEIGHT GAIN 15 tablet 11  . Multiple Vitamins-Minerals (CENTRUM SILVER PO) Take 1 tablet by mouth daily.     . mupirocin cream (BACTROBAN) 2 % Apply 1 application topically 2 (two) times daily. Reported on 11/14/2015    . NITROSTAT 0.4 MG SL tablet PLACE 1 TABLET UNDER THE TONGUE EVERY 5 MINUTES AS NEEDED CHEST PAIN. NO MORE THAN 5 PER15MIN 25 tablet 0  . oxycodone (OXY-IR) 5 MG capsule Take 1 capsule (5 mg total) by mouth every 6 (six) hours as needed for pain. 60 capsule 0  . pantoprazole (PROTONIX) 40 MG tablet TAKE 1 TABLET BY MOUTH TWICE A DAY 60 tablet 11  . promethazine (PHENERGAN) 25 MG tablet Take 25 mg by mouth 2 (two) times daily as needed for nausea or vomiting.    [name] QUEtiapine (SEROQUEL) 200 MG tablet TAKE 1 TABLET BY MOUTH AT BEDTIME 30 tablet 3  . rifaximin (XIFAXAN) 550 MG TABS tablet Take 550 mg by mouth 2 (two) times daily. Reported on 10/17/2015    . rOPINIRole (REQUIP) 4 MG tablet Take 4 mg by mouth at bedtime.    . simvastatin (ZOCOR) 40 MG tablet Take 40 mg by mouth daily.    [name] torsemide (DEMADEX) 20 MG tablet Take 2 tablets (40 mg total) by mouth daily. KEEP OV. 60 tablet 1  . Vitamin D, Ergocalciferol, (DRISDOL) 50000 units CAPS capsule TAKE ONE CAPSULE WEEKLY (Patient taking  differently: TAKE ONE CAPSULE WEEKLY ON FRIDAY) 4 capsule 6   No current facility-administered medications on file prior to  visit.    Allergies  Allergen Reactions  . Acetaminophen Other (See Comments)    Liver dysfunction  . Nsaids Other (See Comments)    Liver dysfunction   . Aspirin Other (See Comments)    Causes nosebleeds  . Theophylline Nausea And Vomiting  . Penicillin G Itching  . Penicillins Itching    Has patient had a PCN reaction causing immediate rash, facial/tongue/throat swelling, SOB or lightheadedness with hypotension: unknown Has patient had a PCN reaction causing severe rash involving mucus membranes or skin necrosis: unknown Has patient had a PCN reaction that required hospitalization unknown Has patient had a PCN reaction occurring within the last 10 years: yes If all of the above answers are "NO", then may proceed with Cephalosporin use.     Family History  Problem Relation Age of Onset  . Heart disease Mother   . Cervical cancer Mother   . Kidney disease Mother   . Diabetes Mother   . Breast cancer Sister   . Multiple sclerosis Sister   . Colon cancer Neg Hx   . Breast cancer Maternal Aunt     x 2  . Diabetes Father   . Diabetes Sister   . Multiple sclerosis Other   . Heart attack Mother   . Heart attack Father   . Stroke Daughter   . Stroke Daughter     BP 130/62 mmHg  Pulse 73  Wt 213 lb (96.616 kg)  SpO2 90%  Review of Systems She denies hypoglycemia.      Objective:   Physical Exam VITAL SIGNS:  See vs page GENERAL: no distress.  In wheelchair.  Has 02 on Pulses: dorsalis pedis intact bilat.  MSK: no deformity of the feet CV: trace right leg edema (1+ on the left) Skin: no ulcer on the feet, but there are slight abrasions on the right legs. normal color and temp on the feet. Neuro: sensation is intact to touch on the feet, but decreased from normal. Ext: There is bilateral onychomycosis of the toenails.   Lab Results  Component Value Date   HGBA1C 6.9* 11/28/2015      Assessment & Plan:  DM: overcontrolled, given this regimen, which does  match insulin to her changing needs throughout the day.  COPD, new to me: steroids can exac glycemic control, so we'll have to follow this.    Patient is advised the following: Patient Instructions  Please continue the same lantus.   Take the humalog just 5 units for any blood sugar over 300.   Please call if you go back on steroids, so we can carefully follow your blood sugar then.  check your blood sugar twice a day.  vary the time of day when you check, between before the 3 meals, and at bedtime.  also check if you have symptoms of your blood sugar being too high or too low.  please keep a record of the readings and bring it to your next appointment here.  You can write it on any piece of paper.  please call us sooner if your blood sugar goes below 70, or if you have a lot of readings over 200. Please come back for a follow-up appointment in 4 months.

## 2016-01-11 ENCOUNTER — Encounter: Payer: Self-pay | Admitting: Family Medicine

## 2016-01-11 ENCOUNTER — Ambulatory Visit (INDEPENDENT_AMBULATORY_CARE_PROVIDER_SITE_OTHER)
Admission: RE | Admit: 2016-01-11 | Discharge: 2016-01-11 | Disposition: A | Discharge status: Home / Self Care | Payer: Medicare Other | Source: Ambulatory Visit | Attending: Family Medicine | Admitting: Family Medicine

## 2016-01-11 ENCOUNTER — Ambulatory Visit (INDEPENDENT_AMBULATORY_CARE_PROVIDER_SITE_OTHER): Payer: Medicare Other | Admitting: Family Medicine

## 2016-01-11 VITALS — BP 118/76 | HR 74 | Temp 98.5°F | Wt 214.8 lb

## 2016-01-11 DIAGNOSIS — J441 Chronic obstructive pulmonary disease with (acute) exacerbation: Secondary | ICD-10-CM | POA: Diagnosis not present

## 2016-01-11 DIAGNOSIS — M546 Pain in thoracic spine: Secondary | ICD-10-CM | POA: Diagnosis not present

## 2016-01-11 DIAGNOSIS — K7469 Other cirrhosis of liver: Secondary | ICD-10-CM | POA: Diagnosis not present

## 2016-01-11 DIAGNOSIS — I350 Nonrheumatic aortic (valve) stenosis: Secondary | ICD-10-CM | POA: Diagnosis not present

## 2016-01-11 DIAGNOSIS — J9611 Chronic respiratory failure with hypoxia: Secondary | ICD-10-CM | POA: Diagnosis not present

## 2016-01-11 DIAGNOSIS — E1165 Type 2 diabetes mellitus with hyperglycemia: Secondary | ICD-10-CM | POA: Diagnosis not present

## 2016-01-11 DIAGNOSIS — E1142 Type 2 diabetes mellitus with diabetic polyneuropathy: Secondary | ICD-10-CM | POA: Diagnosis not present

## 2016-01-11 DIAGNOSIS — I5032 Chronic diastolic (congestive) heart failure: Secondary | ICD-10-CM | POA: Diagnosis not present

## 2016-01-11 DIAGNOSIS — E1122 Type 2 diabetes mellitus with diabetic chronic kidney disease: Secondary | ICD-10-CM | POA: Diagnosis not present

## 2016-01-11 DIAGNOSIS — M419 Scoliosis, unspecified: Secondary | ICD-10-CM | POA: Diagnosis not present

## 2016-01-11 DIAGNOSIS — I130 Hypertensive heart and chronic kidney disease with heart failure and stage 1 through stage 4 chronic kidney disease, or unspecified chronic kidney disease: Secondary | ICD-10-CM | POA: Diagnosis not present

## 2016-01-11 DIAGNOSIS — N183 Chronic kidney disease, stage 3 (moderate): Secondary | ICD-10-CM | POA: Diagnosis not present

## 2016-01-11 DIAGNOSIS — K7581 Nonalcoholic steatohepatitis (NASH): Secondary | ICD-10-CM | POA: Diagnosis not present

## 2016-01-11 DIAGNOSIS — M549 Dorsalgia, unspecified: Secondary | ICD-10-CM | POA: Insufficient documentation

## 2016-01-11 DIAGNOSIS — I2510 Atherosclerotic heart disease of native coronary artery without angina pectoris: Secondary | ICD-10-CM | POA: Diagnosis not present

## 2016-01-11 DIAGNOSIS — D61818 Other pancytopenia: Secondary | ICD-10-CM | POA: Diagnosis not present

## 2016-01-11 MED ORDER — DICLOFENAC SODIUM 1 % TD GEL: 1.0000 "application " | Freq: Three times a day (TID) | TRANSDERMAL | Status: DC

## 2016-01-11 NOTE — Assessment & Plan Note (Signed)
In h/o osteoporosis, check thoracic films to r/o vertebral compression fracture. Also marked pain at R rhomboids - limited in med management options due to chronic medical conditions. Start voltaren gel to back - discussed pea sized amt each application. No signs of shingles.  Hyperalgesia noted today to very light touch R rhomboids - ?FM vs myofascial pain. Pt doesn't want to try lyrica. Consider low dose lyrica vs gabapentin if no improvement with above.

## 2016-01-11 NOTE — Progress Notes (Signed)
BP 118/76 mmHg  Pulse 74  Temp(Src) 98.5 F (36.9 C) (Oral)  Wt 214 lb 12 oz (97.41 kg)  SpO2 93% on 3L [place]  CC: check rib pain  Subjective:    Patient ID: [name], female    DOB: [birth year], 75 y.o.   MRN: [id number]  HPI: [name] is a 75 y.o. female presenting on 01/11/2016 for Chest Pain   See recent phone note for details. [facility] RN endorsed pt had increasing side and lower ribcage pain - pt worried about recurrent broken ribs. Oxycodone IR has been helpful but ongoing "catch" that only gets better with standing. Denies inciting trauma/falls.   Endorses 6 wk h/o worsening side pains, worse with certain position changes. Worse with bending down sharp pain catches her and takes her breath away. Actually points to thoracic back as site of pain.   No endorsed true fevers/chills. Ongoing dyspnea but no change from baseline.   Relevant past medical, surgical, family and social history reviewed and updated as indicated. Interim medical history since our last visit reviewed. Allergies and medications reviewed and updated. Current Outpatient Prescriptions on File Prior to Visit  Medication Sig  . ADVAIR DISKUS 250-50 MCG/DOSE AEPB INHALE 1 PUFF INTO LUNGS TWICE A DAY  . albuterol (PROAIR HFA) 108 (90 BASE) MCG/ACT inhaler Inhale 2 puffs into the lungs every 6 (six) hours as needed for wheezing or shortness of breath. Reported on 08/18/2015  . ALPRAZolam (XANAX) 1 MG tablet TAKE 1/2-1 TAB BY MOUTH 2 TIMES DAILY AS NEEDED FOR ANXIETY OR SLEEP  . B Complex-C ([employer] B COMPLEX PLUS C) TABS TAKE ONE CAPSULE BY MOUTH EVERY DAY  . buPROPion (WELLBUTRIN XL) 150 MG 24 hr tablet Take 150 mg by mouth daily.  [name] desvenlafaxine (PRISTIQ) 50 MG 24 hr tablet Take 3 tablets (150 mg total) by mouth daily.  . ferrous sulfate 325 (65 FE) MG tablet TAKE 1 TABLET BY MOUTH EVERY DAY WITH BREAKFAST  . glucose blood (ONE TOUCH ULTRA TEST) test strip Check blood sugar 4 times daily  . guaiFENesin  (MUCINEX) 600 MG 12 hr tablet Take 600 mg by mouth 2 (two) times daily.   [name] guaiFENesin-codeine (CHERATUSSIN AC) 100-10 MG/5ML syrup Take 5 mLs by mouth 2 (two) times daily as needed for cough (sedation precautions).  . Insulin Glargine (LANTUS SOLOSTAR) 100 UNIT/ML Solostar Pen Inject 120 Units into the skin every morning.  . insulin lispro (HUMALOG KWIKPEN) 100 UNIT/ML KiwkPen Inject 5 Units into the skin 3 (three) times daily with meals. Take only if blood sugar is over 300  . Insulin Pen Needle (B-D ULTRAFINE III SHORT PEN) 31G X 8 MM MISC Use as directed to inject insulin twice daily. Dx: E11.8  . isosorbide mononitrate (IMDUR) 30 MG 24 hr tablet Take 0.5 tablets (15 mg total) by mouth daily.  [name] KLOR-CON M20 20 MEQ tablet TAKE 2 TABLETS EVERY DAY  . lactulose, encephalopathy, (CHRONULAC) 10 GM/15ML SOLN Take 20 g by mouth 3 (three) times daily.   [name] levalbuterol (XOPENEX) 0.63 MG/3ML nebulizer solution Take 0.63 mg by nebulization every 8 (eight) hours as needed for wheezing or shortness of breath.   . loratadine (CLARITIN) 10 MG tablet TAKE 1 TABLET BY MOUTH EVERY DAY  . metolazone (ZAROXOLYN) 2.5 MG tablet TAKE AS DIRECTED FOR 4 LB WEIGHT GAIN  . Multiple Vitamins-Minerals (CENTRUM SILVER PO) Take 1 tablet by mouth daily.   . mupirocin cream (BACTROBAN) 2 % Apply 1 application topically  2 (two) times daily. Reported on 11/14/2015  . NITROSTAT 0.4 MG SL tablet PLACE 1 TABLET UNDER THE TONGUE EVERY 5 MINUTES AS NEEDED CHEST PAIN. NO MORE THAN 5 PER15MIN  . oxycodone (OXY-IR) 5 MG capsule Take 1 capsule (5 mg total) by mouth every 6 (six) hours as needed for pain.  . pantoprazole (PROTONIX) 40 MG tablet TAKE 1 TABLET BY MOUTH TWICE A DAY  . promethazine (PHENERGAN) 25 MG tablet Take 25 mg by mouth 2 (two) times daily as needed for nausea or vomiting.  [name] QUEtiapine (SEROQUEL) 200 MG tablet TAKE 1 TABLET BY MOUTH AT BEDTIME  . rifaximin (XIFAXAN) 550 MG TABS tablet Take 550 mg by mouth 2 (two)  times daily. Reported on 10/17/2015  . rOPINIRole (REQUIP) 4 MG tablet Take 4 mg by mouth at bedtime.  . simvastatin (ZOCOR) 40 MG tablet Take 40 mg by mouth daily.  [name] torsemide (DEMADEX) 20 MG tablet Take 2 tablets (40 mg total) by mouth daily. KEEP OV.  . Vitamin D, Ergocalciferol, (DRISDOL) 50000 units CAPS capsule TAKE ONE CAPSULE WEEKLY (Patient taking differently: TAKE ONE CAPSULE WEEKLY ON FRIDAY)   No current facility-administered medications on file prior to visit.    Review of Systems Per HPI unless specifically indicated in ROS section     Objective:    BP 118/76 mmHg  Pulse 74  Temp(Src) 98.5 F (36.9 C) (Oral)  Wt 214 lb 12 oz (97.41 kg)  SpO2 93%  Wt Readings from Last 3 Encounters:  01/11/16 214 lb 12 oz (97.41 kg)  01/10/16 213 lb (96.616 kg)  12/25/15 214 lb 12 oz (97.41 kg)    Physical Exam  Constitutional: She appears well-developed and well-nourished. No distress.  Cardiovascular: Normal rate, regular rhythm and intact distal pulses.   Murmur (systolic) heard. Pulmonary/Chest: Effort normal and breath sounds normal. No respiratory distress. She has no wheezes. She has no rales.  Musculoskeletal: She exhibits no edema.  Exquisitely tender to palpation upper thoracic spine midline as well as right rhomboid mm area.   Skin: Skin is warm and dry. No rash noted.  No vesicular rash on back  Nursing note and vitals reviewed.     Assessment & Plan:  Patient with h/o complex conditions with multiple comorbidities.  Problem List Items Addressed This Visit    Right-sided thoracic back pain - Primary    In h/o osteoporosis, check thoracic films to r/o vertebral compression fracture. Also marked pain at R rhomboids - limited in med management options due to chronic medical conditions. Start voltaren gel to back - discussed pea sized amt each application. No signs of shingles.  Hyperalgesia noted today to very light touch R rhomboids - ?FM vs myofascial pain. Pt  doesn't want to try lyrica. Consider low dose lyrica vs gabapentin if no improvement with above.      Relevant Orders   DG Thoracic Spine W/Swimmers       Follow up plan: Return if symptoms worsen or fail to improve.  Ria Bush, MD

## 2016-01-11 NOTE — Progress Notes (Signed)
Pre visit review using our clinic review tool, if applicable. No additional management support is needed unless otherwise documented below in the visit note. 

## 2016-01-11 NOTE — Patient Instructions (Addendum)
We are limited in medicines we can try for your pain. Continue oxycodone.  xrays of thoracic spine today.  Trial voltaren gel for back.

## 2016-01-12 ENCOUNTER — Telehealth: Payer: Self-pay | Admitting: Family Medicine

## 2016-01-12 NOTE — Telephone Encounter (Signed)
Is this through [facility] which is already involved? Ok to do.

## 2016-01-12 NOTE — Telephone Encounter (Signed)
tammy patterson from [facility] called to get verbal order palliative care to continue in the home cb [phone] Thank you

## 2016-01-15 DIAGNOSIS — J441 Chronic obstructive pulmonary disease with (acute) exacerbation: Secondary | ICD-10-CM | POA: Diagnosis not present

## 2016-01-15 DIAGNOSIS — K7469 Other cirrhosis of liver: Secondary | ICD-10-CM | POA: Diagnosis not present

## 2016-01-15 DIAGNOSIS — D61818 Other pancytopenia: Secondary | ICD-10-CM | POA: Diagnosis not present

## 2016-01-15 DIAGNOSIS — N183 Chronic kidney disease, stage 3 (moderate): Secondary | ICD-10-CM | POA: Diagnosis not present

## 2016-01-15 DIAGNOSIS — J9611 Chronic respiratory failure with hypoxia: Secondary | ICD-10-CM | POA: Diagnosis not present

## 2016-01-15 DIAGNOSIS — E1122 Type 2 diabetes mellitus with diabetic chronic kidney disease: Secondary | ICD-10-CM | POA: Diagnosis not present

## 2016-01-15 DIAGNOSIS — I5032 Chronic diastolic (congestive) heart failure: Secondary | ICD-10-CM | POA: Diagnosis not present

## 2016-01-15 DIAGNOSIS — E1165 Type 2 diabetes mellitus with hyperglycemia: Secondary | ICD-10-CM | POA: Diagnosis not present

## 2016-01-15 DIAGNOSIS — I130 Hypertensive heart and chronic kidney disease with heart failure and stage 1 through stage 4 chronic kidney disease, or unspecified chronic kidney disease: Secondary | ICD-10-CM | POA: Diagnosis not present

## 2016-01-15 DIAGNOSIS — I350 Nonrheumatic aortic (valve) stenosis: Secondary | ICD-10-CM | POA: Diagnosis not present

## 2016-01-15 DIAGNOSIS — K7581 Nonalcoholic steatohepatitis (NASH): Secondary | ICD-10-CM | POA: Diagnosis not present

## 2016-01-15 DIAGNOSIS — I2510 Atherosclerotic heart disease of native coronary artery without angina pectoris: Secondary | ICD-10-CM | POA: Diagnosis not present

## 2016-01-15 DIAGNOSIS — E1142 Type 2 diabetes mellitus with diabetic polyneuropathy: Secondary | ICD-10-CM | POA: Diagnosis not present

## 2016-01-15 NOTE — Telephone Encounter (Signed)
Tammy notified as instructed by telephone. Was advised by Tammy that this is through [facility].

## 2016-01-16 DIAGNOSIS — E1122 Type 2 diabetes mellitus with diabetic chronic kidney disease: Secondary | ICD-10-CM | POA: Diagnosis not present

## 2016-01-16 DIAGNOSIS — K7469 Other cirrhosis of liver: Secondary | ICD-10-CM | POA: Diagnosis not present

## 2016-01-16 DIAGNOSIS — J441 Chronic obstructive pulmonary disease with (acute) exacerbation: Secondary | ICD-10-CM | POA: Diagnosis not present

## 2016-01-16 DIAGNOSIS — D61818 Other pancytopenia: Secondary | ICD-10-CM | POA: Diagnosis not present

## 2016-01-16 DIAGNOSIS — E1165 Type 2 diabetes mellitus with hyperglycemia: Secondary | ICD-10-CM | POA: Diagnosis not present

## 2016-01-16 DIAGNOSIS — I130 Hypertensive heart and chronic kidney disease with heart failure and stage 1 through stage 4 chronic kidney disease, or unspecified chronic kidney disease: Secondary | ICD-10-CM | POA: Diagnosis not present

## 2016-01-16 DIAGNOSIS — J9611 Chronic respiratory failure with hypoxia: Secondary | ICD-10-CM | POA: Diagnosis not present

## 2016-01-16 DIAGNOSIS — E1142 Type 2 diabetes mellitus with diabetic polyneuropathy: Secondary | ICD-10-CM | POA: Diagnosis not present

## 2016-01-16 DIAGNOSIS — I350 Nonrheumatic aortic (valve) stenosis: Secondary | ICD-10-CM | POA: Diagnosis not present

## 2016-01-16 DIAGNOSIS — K7581 Nonalcoholic steatohepatitis (NASH): Secondary | ICD-10-CM | POA: Diagnosis not present

## 2016-01-16 DIAGNOSIS — N183 Chronic kidney disease, stage 3 (moderate): Secondary | ICD-10-CM | POA: Diagnosis not present

## 2016-01-16 DIAGNOSIS — I2510 Atherosclerotic heart disease of native coronary artery without angina pectoris: Secondary | ICD-10-CM | POA: Diagnosis not present

## 2016-01-16 DIAGNOSIS — I5032 Chronic diastolic (congestive) heart failure: Secondary | ICD-10-CM | POA: Diagnosis not present

## 2016-01-17 ENCOUNTER — Encounter (HOSPITAL_COMMUNITY)
Admission: RE | Admit: 2016-01-17 | Discharge: 2016-01-17 | Disposition: A | Discharge status: Home / Self Care | Payer: Medicare Other | Source: Ambulatory Visit | Attending: Family Medicine | Admitting: Family Medicine

## 2016-01-17 DIAGNOSIS — K7460 Unspecified cirrhosis of liver: Secondary | ICD-10-CM | POA: Diagnosis not present

## 2016-01-17 DIAGNOSIS — Q2733 Arteriovenous malformation of digestive system vessel: Secondary | ICD-10-CM | POA: Diagnosis not present

## 2016-01-17 DIAGNOSIS — D649 Anemia, unspecified: Secondary | ICD-10-CM | POA: Diagnosis not present

## 2016-01-17 DIAGNOSIS — N289 Disorder of kidney and ureter, unspecified: Secondary | ICD-10-CM | POA: Diagnosis not present

## 2016-01-17 MED ORDER — DARBEPOETIN ALFA 200 MCG/0.4ML IJ SOSY
200.0000 ug | PREFILLED_SYRINGE | INTRAMUSCULAR | Status: DC
  Administered 2016-01-17: 200 ug via SUBCUTANEOUS

## 2016-01-17 MED ORDER — DARBEPOETIN ALFA 200 MCG/0.4ML IJ SOSY
PREFILLED_SYRINGE | INTRAMUSCULAR | Status: AC
  Filled 2016-01-17: qty 0.4

## 2016-01-18 LAB — POCT HEMOGLOBIN-HEMACUE: HEMOGLOBIN: 8.5 g/dL — AB (ref 12.0–15.0)

## 2016-01-19 ENCOUNTER — Telehealth: Payer: Self-pay | Admitting: Cardiovascular Disease

## 2016-01-19 DIAGNOSIS — J9611 Chronic respiratory failure with hypoxia: Secondary | ICD-10-CM | POA: Diagnosis not present

## 2016-01-19 DIAGNOSIS — I350 Nonrheumatic aortic (valve) stenosis: Secondary | ICD-10-CM | POA: Diagnosis not present

## 2016-01-19 DIAGNOSIS — I130 Hypertensive heart and chronic kidney disease with heart failure and stage 1 through stage 4 chronic kidney disease, or unspecified chronic kidney disease: Secondary | ICD-10-CM | POA: Diagnosis not present

## 2016-01-19 DIAGNOSIS — D61818 Other pancytopenia: Secondary | ICD-10-CM | POA: Diagnosis not present

## 2016-01-19 DIAGNOSIS — K7469 Other cirrhosis of liver: Secondary | ICD-10-CM | POA: Diagnosis not present

## 2016-01-19 DIAGNOSIS — E1142 Type 2 diabetes mellitus with diabetic polyneuropathy: Secondary | ICD-10-CM | POA: Diagnosis not present

## 2016-01-19 DIAGNOSIS — J441 Chronic obstructive pulmonary disease with (acute) exacerbation: Secondary | ICD-10-CM | POA: Diagnosis not present

## 2016-01-19 DIAGNOSIS — E1122 Type 2 diabetes mellitus with diabetic chronic kidney disease: Secondary | ICD-10-CM | POA: Diagnosis not present

## 2016-01-19 DIAGNOSIS — N183 Chronic kidney disease, stage 3 (moderate): Secondary | ICD-10-CM | POA: Diagnosis not present

## 2016-01-19 DIAGNOSIS — K7581 Nonalcoholic steatohepatitis (NASH): Secondary | ICD-10-CM | POA: Diagnosis not present

## 2016-01-19 DIAGNOSIS — E1165 Type 2 diabetes mellitus with hyperglycemia: Secondary | ICD-10-CM | POA: Diagnosis not present

## 2016-01-19 DIAGNOSIS — I2510 Atherosclerotic heart disease of native coronary artery without angina pectoris: Secondary | ICD-10-CM | POA: Diagnosis not present

## 2016-01-19 DIAGNOSIS — I5032 Chronic diastolic (congestive) heart failure: Secondary | ICD-10-CM | POA: Diagnosis not present

## 2016-01-19 NOTE — Telephone Encounter (Signed)
Returned call to patient.She stated her sob is no worse than normally.Stated she is getting dressed going to see her husband who is in ICU.Stated she has been sitting most of week with legs down due to husband in hospital.Stated her weight is stable.Swelling in left leg worse.Stated she knows to take metolazone if needed.Advised to call back if needed.Keep appointment with Dr.Cooper 02/16/16 at 9:00 am.

## 2016-01-19 NOTE — Telephone Encounter (Signed)
New message   The home health nurse only spoke to the pt over the phone this am.   Pt c/o Shortness Of Breath: STAT if SOB developed within the last 24 hours or pt is noticeably SOB on the phone  1. Are you currently SOB (can you hear that pt is SOB on the phone)? Per home health nurse this morning  2. How long have you been experiencing SOB? X 3 days per home health nurse  3. Are you SOB when sitting or when up moving around? The home health care nurse was uncertain the nurse only talked with the pt on the phone  4. Are you currently experiencing any other symptoms? Swelling in the legs   Pt c/o swelling: STAT is pt has developed SOB within 24 hours  1. How long have you been experiencing swelling? For the last 3 days per home health nurse  2. Where is the swelling located? Legs  3.  Are you currently taking a "fluid pill"? Per home health nurse-1 pill daily and the other as needed  4.  Are you currently SOB? This morning over the phone  5.  Have you traveled recently? The pt is going back forth tot the hospital to see her husband

## 2016-01-23 ENCOUNTER — Telehealth: Payer: Self-pay

## 2016-01-23 DIAGNOSIS — N183 Chronic kidney disease, stage 3 (moderate): Secondary | ICD-10-CM | POA: Diagnosis not present

## 2016-01-23 DIAGNOSIS — E1122 Type 2 diabetes mellitus with diabetic chronic kidney disease: Secondary | ICD-10-CM | POA: Diagnosis not present

## 2016-01-23 DIAGNOSIS — I2510 Atherosclerotic heart disease of native coronary artery without angina pectoris: Secondary | ICD-10-CM | POA: Diagnosis not present

## 2016-01-23 DIAGNOSIS — I130 Hypertensive heart and chronic kidney disease with heart failure and stage 1 through stage 4 chronic kidney disease, or unspecified chronic kidney disease: Secondary | ICD-10-CM | POA: Diagnosis not present

## 2016-01-23 DIAGNOSIS — I5032 Chronic diastolic (congestive) heart failure: Secondary | ICD-10-CM | POA: Diagnosis not present

## 2016-01-23 DIAGNOSIS — J441 Chronic obstructive pulmonary disease with (acute) exacerbation: Secondary | ICD-10-CM | POA: Diagnosis not present

## 2016-01-23 DIAGNOSIS — K7469 Other cirrhosis of liver: Secondary | ICD-10-CM | POA: Diagnosis not present

## 2016-01-23 DIAGNOSIS — E1142 Type 2 diabetes mellitus with diabetic polyneuropathy: Secondary | ICD-10-CM | POA: Diagnosis not present

## 2016-01-23 DIAGNOSIS — I350 Nonrheumatic aortic (valve) stenosis: Secondary | ICD-10-CM | POA: Diagnosis not present

## 2016-01-23 DIAGNOSIS — D61818 Other pancytopenia: Secondary | ICD-10-CM | POA: Diagnosis not present

## 2016-01-23 DIAGNOSIS — E1165 Type 2 diabetes mellitus with hyperglycemia: Secondary | ICD-10-CM | POA: Diagnosis not present

## 2016-01-23 DIAGNOSIS — K7581 Nonalcoholic steatohepatitis (NASH): Secondary | ICD-10-CM | POA: Diagnosis not present

## 2016-01-23 DIAGNOSIS — J9611 Chronic respiratory failure with hypoxia: Secondary | ICD-10-CM | POA: Diagnosis not present
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
## 2016-01-24 DIAGNOSIS — J441 Chronic obstructive pulmonary disease with (acute) exacerbation: Secondary | ICD-10-CM | POA: Diagnosis not present

## 2016-01-24 DIAGNOSIS — K7469 Other cirrhosis of liver: Secondary | ICD-10-CM | POA: Diagnosis not present

## 2016-01-24 DIAGNOSIS — J9611 Chronic respiratory failure with hypoxia: Secondary | ICD-10-CM | POA: Diagnosis not present

## 2016-01-24 DIAGNOSIS — D61818 Other pancytopenia: Secondary | ICD-10-CM | POA: Diagnosis not present

## 2016-01-24 DIAGNOSIS — E1122 Type 2 diabetes mellitus with diabetic chronic kidney disease: Secondary | ICD-10-CM | POA: Diagnosis not present

## 2016-01-24 DIAGNOSIS — E1165 Type 2 diabetes mellitus with hyperglycemia: Secondary | ICD-10-CM | POA: Diagnosis not present

## 2016-01-24 DIAGNOSIS — I130 Hypertensive heart and chronic kidney disease with heart failure and stage 1 through stage 4 chronic kidney disease, or unspecified chronic kidney disease: Secondary | ICD-10-CM | POA: Diagnosis not present

## 2016-01-24 DIAGNOSIS — I2510 Atherosclerotic heart disease of native coronary artery without angina pectoris: Secondary | ICD-10-CM | POA: Diagnosis not present

## 2016-01-24 DIAGNOSIS — I5032 Chronic diastolic (congestive) heart failure: Secondary | ICD-10-CM | POA: Diagnosis not present

## 2016-01-24 DIAGNOSIS — N183 Chronic kidney disease, stage 3 (moderate): Secondary | ICD-10-CM | POA: Diagnosis not present

## 2016-01-24 DIAGNOSIS — K7581 Nonalcoholic steatohepatitis (NASH): Secondary | ICD-10-CM | POA: Diagnosis not present

## 2016-01-24 DIAGNOSIS — E1142 Type 2 diabetes mellitus with diabetic polyneuropathy: Secondary | ICD-10-CM | POA: Diagnosis not present

## 2016-01-24 DIAGNOSIS — I350 Nonrheumatic aortic (valve) stenosis: Secondary | ICD-10-CM | POA: Diagnosis not present

## 2016-01-25 DIAGNOSIS — I130 Hypertensive heart and chronic kidney disease with heart failure and stage 1 through stage 4 chronic kidney disease, or unspecified chronic kidney disease: Secondary | ICD-10-CM | POA: Diagnosis not present

## 2016-01-25 DIAGNOSIS — I350 Nonrheumatic aortic (valve) stenosis: Secondary | ICD-10-CM | POA: Diagnosis not present

## 2016-01-25 DIAGNOSIS — E1122 Type 2 diabetes mellitus with diabetic chronic kidney disease: Secondary | ICD-10-CM | POA: Diagnosis not present

## 2016-01-25 DIAGNOSIS — N183 Chronic kidney disease, stage 3 (moderate): Secondary | ICD-10-CM | POA: Diagnosis not present

## 2016-01-25 DIAGNOSIS — K7469 Other cirrhosis of liver: Secondary | ICD-10-CM | POA: Diagnosis not present

## 2016-01-25 DIAGNOSIS — I5032 Chronic diastolic (congestive) heart failure: Secondary | ICD-10-CM | POA: Diagnosis not present

## 2016-01-25 DIAGNOSIS — K7581 Nonalcoholic steatohepatitis (NASH): Secondary | ICD-10-CM | POA: Diagnosis not present

## 2016-01-25 DIAGNOSIS — E1165 Type 2 diabetes mellitus with hyperglycemia: Secondary | ICD-10-CM | POA: Diagnosis not present

## 2016-01-25 DIAGNOSIS — D61818 Other pancytopenia: Secondary | ICD-10-CM | POA: Diagnosis not present

## 2016-01-25 DIAGNOSIS — I2510 Atherosclerotic heart disease of native coronary artery without angina pectoris: Secondary | ICD-10-CM | POA: Diagnosis not present

## 2016-01-25 DIAGNOSIS — E1142 Type 2 diabetes mellitus with diabetic polyneuropathy: Secondary | ICD-10-CM | POA: Diagnosis not present

## 2016-01-25 DIAGNOSIS — J9611 Chronic respiratory failure with hypoxia: Secondary | ICD-10-CM | POA: Diagnosis not present

## 2016-01-25 DIAGNOSIS — J441 Chronic obstructive pulmonary disease with (acute) exacerbation: Secondary | ICD-10-CM | POA: Diagnosis not present

## 2016-01-26 ENCOUNTER — Encounter: Payer: Self-pay | Admitting: Family Medicine

## 2016-01-26 ENCOUNTER — Ambulatory Visit (INDEPENDENT_AMBULATORY_CARE_PROVIDER_SITE_OTHER): Payer: Medicare Other | Admitting: Family Medicine

## 2016-01-26 ENCOUNTER — Telehealth: Payer: Self-pay

## 2016-01-26 VITALS — BP 136/64 | HR 76 | Temp 98.3°F | Wt 215.5 lb

## 2016-01-26 DIAGNOSIS — I5032 Chronic diastolic (congestive) heart failure: Secondary | ICD-10-CM

## 2016-01-26 DIAGNOSIS — N3080 Other cystitis without hematuria: Secondary | ICD-10-CM | POA: Diagnosis not present

## 2016-01-26 DIAGNOSIS — Z515 Encounter for palliative care: Secondary | ICD-10-CM | POA: Diagnosis not present

## 2016-01-26 DIAGNOSIS — F333 Major depressive disorder, recurrent, severe with psychotic symptoms: Secondary | ICD-10-CM | POA: Diagnosis not present

## 2016-01-26 DIAGNOSIS — D500 Iron deficiency anemia secondary to blood loss (chronic): Secondary | ICD-10-CM

## 2016-01-26 DIAGNOSIS — N3090 Cystitis, unspecified without hematuria: Secondary | ICD-10-CM
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
## 2016-01-26 NOTE — Patient Instructions (Addendum)
Good to see you today, no changes to medicines today. Return in 6 weeks for follow up.  I'll be thinking about [name] and following along.

## 2016-01-26 NOTE — Telephone Encounter (Signed)
[name] PT with [facility] left v/m requesting verbal orders to extend home health PT order for 2 x a week for 6 weeks to continue working on balance and cardio endurance.

## 2016-01-26 NOTE — Telephone Encounter (Signed)
Ok to do. Thanks.  

## 2016-01-26 NOTE — Assessment & Plan Note (Signed)
Recently saw uro 2017 - note reviewed. rec only treat if documented UTI.

## 2016-01-26 NOTE — Assessment & Plan Note (Signed)
Seems overall euvolemic today.  Reviewed diuretic dosing with metolazone PRN for weight gain (took one today). Reviewed goal dry weight ~210lbs

## 2016-01-26 NOTE — Assessment & Plan Note (Signed)
Some worsening with husband's recent hospitalization. Support provided. No med changes today. Continue pristiq, xanax, seroquel.

## 2016-01-26 NOTE — Assessment & Plan Note (Signed)
aranesp /Hgb pending next Wednesday.

## 2016-01-26 NOTE — Progress Notes (Signed)
Pre visit review using our clinic review tool, if applicable. No additional management support is needed unless otherwise documented below in the visit note. 

## 2016-01-29 ENCOUNTER — Telehealth: Payer: Self-pay

## 2016-01-29 MED ORDER — ALPRAZOLAM 1 MG PO TABS: ORAL_TABLET | ORAL | Status: DC

## 2016-01-29 MED ORDER — OXYCODONE HCL 5 MG PO CAPS: 5.0000 mg | ORAL_CAPSULE | Freq: Four times a day (QID) | ORAL | Status: DC | PRN

## 2016-01-29 NOTE — Telephone Encounter (Signed)
plz phone in xanax. Oxycodone Rx printed and in Kim's box. Can she have someone come pick up med for her?  I'm sorry she's going through stressful time with patient's and husband's illnesses. How did husband do over weekend?

## 2016-01-29 NOTE — Telephone Encounter (Signed)
[facility], pt's sister will pick up oxycodone rx. She said that her husband is doing better today.

## 2016-01-29 NOTE — Telephone Encounter (Signed)
Joann RN saw pt earlier today and called as back ground; pt is having to take pain med q 6 h for severe back pain. Pt usually home bound and now having to go to hospital to visit husband and having more pain. Advised Joann that pt has already requested med. Joann just wanted Dr Darnell Level to understand pt's situation at home and that [facility] doctor gave # 30 oxycodone on 01/23/16. There is a phone note on 01/23/16 to that effect. FYI to Dr Darnell Level.

## 2016-01-29 NOTE — Telephone Encounter (Signed)
Pt left v/m requesting refill alprazolam (last refilled # 60 on 12/21/15) [facility] [address] and rx of oxycodone(last printed # 60 on 12/25/15).Pt last seen on 01/26/16. Pt is out of med. Pt cannot come to office because she cannot get out of bed but feels like going to have breakdown; pt has been hurting and has not slept for 2 days. pts husband is in ICU. Pt request cb ASAP.

## 2016-01-29 NOTE — Telephone Encounter (Signed)
Left detailed message.   

## 2016-01-29 NOTE — Telephone Encounter (Addendum)
Noted increased oxycodone need. #30 01/23/2016, #60 to fill 01/29/2016. Taking 1 tab Q6 hours currently - should last her at least 10 days.

## 2016-01-30 ENCOUNTER — Telehealth: Payer: Self-pay | Admitting: *Deleted

## 2016-01-30 DIAGNOSIS — J449 Chronic obstructive pulmonary disease, unspecified: Secondary | ICD-10-CM | POA: Diagnosis not present

## 2016-01-30 DIAGNOSIS — N183 Chronic kidney disease, stage 3 (moderate): Secondary | ICD-10-CM | POA: Diagnosis not present

## 2016-01-30 DIAGNOSIS — E1142 Type 2 diabetes mellitus with diabetic polyneuropathy: Secondary | ICD-10-CM | POA: Diagnosis not present

## 2016-01-30 DIAGNOSIS — E1122 Type 2 diabetes mellitus with diabetic chronic kidney disease: Secondary | ICD-10-CM | POA: Diagnosis not present

## 2016-01-30 DIAGNOSIS — M1991 Primary osteoarthritis, unspecified site: Secondary | ICD-10-CM | POA: Diagnosis not present

## 2016-01-30 DIAGNOSIS — I130 Hypertensive heart and chronic kidney disease with heart failure and stage 1 through stage 4 chronic kidney disease, or unspecified chronic kidney disease: Secondary | ICD-10-CM | POA: Diagnosis not present

## 2016-01-30 DIAGNOSIS — M797 Fibromyalgia: Secondary | ICD-10-CM | POA: Diagnosis not present

## 2016-01-30 DIAGNOSIS — I2510 Atherosclerotic heart disease of native coronary artery without angina pectoris: Secondary | ICD-10-CM | POA: Diagnosis not present

## 2016-01-30 DIAGNOSIS — D61818 Other pancytopenia: Secondary | ICD-10-CM | POA: Diagnosis not present

## 2016-01-30 DIAGNOSIS — J9611 Chronic respiratory failure with hypoxia: Secondary | ICD-10-CM | POA: Diagnosis not present

## 2016-01-30 DIAGNOSIS — K7469 Other cirrhosis of liver: Secondary | ICD-10-CM | POA: Diagnosis not present

## 2016-01-30 DIAGNOSIS — I5032 Chronic diastolic (congestive) heart failure: Secondary | ICD-10-CM | POA: Diagnosis not present

## 2016-01-30 DIAGNOSIS — K7581 Nonalcoholic steatohepatitis (NASH): Secondary | ICD-10-CM | POA: Diagnosis not present

## 2016-01-30 MED ORDER — OXYCODONE HCL 5 MG PO TABS: 5.0000 mg | ORAL_TABLET | Freq: Four times a day (QID) | ORAL | Status: DC | PRN

## 2016-01-30 NOTE — Telephone Encounter (Signed)
Printed tablets for patient and handed to [place].

## 2016-01-30 NOTE — Telephone Encounter (Signed)
Patient is in office now stating that the wrong script was given to her for her Oxycodone. Patient stated that she picked up a script yesterday from the office and it was written for capsules and it was going to cost her $95 and she can not afford that. Patient stated that the pharmacy sold her #8. Patient stated that she has always gotten the Oxycodone in tablet form and it only cost her $6. Patient showed me the [facility] of [facility] and the count was #8. Patient requested a new script for tablets. Patient is waiting because she has problems with transportation since her husband is in ICU.

## 2016-01-30 NOTE — Telephone Encounter (Signed)
Prescription given to patient and she signed for it.

## 2016-01-31 ENCOUNTER — Encounter (HOSPITAL_COMMUNITY)
Admission: RE | Admit: 2016-01-31 | Discharge: 2016-01-31 | Disposition: A | Discharge status: Home / Self Care | Payer: Medicare Other | Source: Ambulatory Visit | Attending: Pulmonary Disease | Admitting: Pulmonary Disease

## 2016-01-31 DIAGNOSIS — K7460 Unspecified cirrhosis of liver: Secondary | ICD-10-CM | POA: Insufficient documentation

## 2016-01-31 DIAGNOSIS — E1142 Type 2 diabetes mellitus with diabetic polyneuropathy: Secondary | ICD-10-CM | POA: Diagnosis not present

## 2016-01-31 DIAGNOSIS — E1122 Type 2 diabetes mellitus with diabetic chronic kidney disease: Secondary | ICD-10-CM | POA: Diagnosis not present

## 2016-01-31 DIAGNOSIS — J449 Chronic obstructive pulmonary disease, unspecified: Secondary | ICD-10-CM | POA: Diagnosis not present

## 2016-01-31 DIAGNOSIS — K7581 Nonalcoholic steatohepatitis (NASH): Secondary | ICD-10-CM | POA: Diagnosis not present

## 2016-01-31 DIAGNOSIS — K7469 Other cirrhosis of liver: Secondary | ICD-10-CM | POA: Diagnosis not present

## 2016-01-31 DIAGNOSIS — J9611 Chronic respiratory failure with hypoxia: Secondary | ICD-10-CM | POA: Diagnosis not present

## 2016-01-31 DIAGNOSIS — Q2733 Arteriovenous malformation of digestive system vessel: Secondary | ICD-10-CM | POA: Diagnosis not present

## 2016-01-31 DIAGNOSIS — M797 Fibromyalgia: Secondary | ICD-10-CM | POA: Diagnosis not present

## 2016-01-31 DIAGNOSIS — N289 Disorder of kidney and ureter, unspecified: Secondary | ICD-10-CM | POA: Insufficient documentation

## 2016-01-31 DIAGNOSIS — D61818 Other pancytopenia: Secondary | ICD-10-CM | POA: Diagnosis not present

## 2016-01-31 DIAGNOSIS — I130 Hypertensive heart and chronic kidney disease with heart failure and stage 1 through stage 4 chronic kidney disease, or unspecified chronic kidney disease: Secondary | ICD-10-CM | POA: Diagnosis not present

## 2016-01-31 DIAGNOSIS — I5032 Chronic diastolic (congestive) heart failure: Secondary | ICD-10-CM | POA: Diagnosis not present

## 2016-01-31 DIAGNOSIS — N183 Chronic kidney disease, stage 3 (moderate): Secondary | ICD-10-CM | POA: Diagnosis not present

## 2016-01-31 DIAGNOSIS — M1991 Primary osteoarthritis, unspecified site: Secondary | ICD-10-CM | POA: Diagnosis not present

## 2016-01-31 DIAGNOSIS — D649 Anemia, unspecified: Secondary | ICD-10-CM | POA: Diagnosis not present

## 2016-01-31 DIAGNOSIS — I2510 Atherosclerotic heart disease of native coronary artery without angina pectoris: Secondary | ICD-10-CM | POA: Diagnosis not present

## 2016-01-31 LAB — POCT HEMOGLOBIN-HEMACUE: Hemoglobin: 8.9 g/dL — ABNORMAL LOW (ref 12.0–15.0)

## 2016-01-31 MED ORDER — DARBEPOETIN ALFA 200 MCG/0.4ML IJ SOSY
PREFILLED_SYRINGE | INTRAMUSCULAR | Status: AC
  Filled 2016-01-31: qty 0.4

## 2016-01-31 MED ORDER — DARBEPOETIN ALFA 200 MCG/0.4ML IJ SOSY
200.0000 ug | PREFILLED_SYRINGE | INTRAMUSCULAR | Status: DC
  Administered 2016-01-31: 200 ug via SUBCUTANEOUS

## 2016-02-01 ENCOUNTER — Telehealth: Payer: Self-pay

## 2016-02-01 ENCOUNTER — Encounter: Payer: Self-pay | Admitting: *Deleted

## 2016-02-01 DIAGNOSIS — I2510 Atherosclerotic heart disease of native coronary artery without angina pectoris: Secondary | ICD-10-CM | POA: Diagnosis not present

## 2016-02-01 DIAGNOSIS — D61818 Other pancytopenia: Secondary | ICD-10-CM | POA: Diagnosis not present

## 2016-02-01 DIAGNOSIS — N183 Chronic kidney disease, stage 3 (moderate): Secondary | ICD-10-CM | POA: Diagnosis not present

## 2016-02-01 DIAGNOSIS — E1122 Type 2 diabetes mellitus with diabetic chronic kidney disease: Secondary | ICD-10-CM | POA: Diagnosis not present

## 2016-02-01 DIAGNOSIS — E1142 Type 2 diabetes mellitus with diabetic polyneuropathy: Secondary | ICD-10-CM | POA: Diagnosis not present

## 2016-02-01 DIAGNOSIS — K7469 Other cirrhosis of liver: Secondary | ICD-10-CM | POA: Diagnosis not present

## 2016-02-01 DIAGNOSIS — J9611 Chronic respiratory failure with hypoxia: Secondary | ICD-10-CM | POA: Diagnosis not present

## 2016-02-01 DIAGNOSIS — I5032 Chronic diastolic (congestive) heart failure: Secondary | ICD-10-CM | POA: Diagnosis not present

## 2016-02-01 DIAGNOSIS — K7581 Nonalcoholic steatohepatitis (NASH): Secondary | ICD-10-CM | POA: Diagnosis not present

## 2016-02-01 DIAGNOSIS — J449 Chronic obstructive pulmonary disease, unspecified: Secondary | ICD-10-CM | POA: Diagnosis not present

## 2016-02-01 DIAGNOSIS — I130 Hypertensive heart and chronic kidney disease with heart failure and stage 1 through stage 4 chronic kidney disease, or unspecified chronic kidney disease: Secondary | ICD-10-CM | POA: Diagnosis not present

## 2016-02-01 DIAGNOSIS — M797 Fibromyalgia: Secondary | ICD-10-CM | POA: Diagnosis not present

## 2016-02-01 DIAGNOSIS — M1991 Primary osteoarthritis, unspecified site: Secondary | ICD-10-CM | POA: Diagnosis not present

## 2016-02-01 NOTE — Telephone Encounter (Signed)
Spoke with patient who gives me permission to speak with son Loanne Drilling) about this. Discussed patient's illness and prognosis.  Husband is gravely ill in the hospital with limited prognosis. He has been her primary caregiver. Concern over patient staying at home alone without husband.  Discussed home care vs transition to ALF and higher level of care.  Can we call [facility] and ask for SW or CM to contact patient and son to discuss living situation over next few months with husband in hospital? Then can we touch base with patient or son and give them an udpate? I can fill out FL2 form if needed and they decide to go that route.   Ms Cindie Laroche from [place] program [phone]

## 2016-02-01 NOTE — Telephone Encounter (Signed)
Pt left v/m; pts son is here from [place] and request cb today; pt and pts son wants to speak briefly with Dr Darnell Level about pt's condition; decisions are needing to be made ASAP since pts husband is still in the hospital and pts children want to speak with Dr Darnell Level about pts condition and decisions needing to be made about pt and her care. Please call pt at [phone].

## 2016-02-01 NOTE — Telephone Encounter (Signed)
Left voicemail on Ms. Patterson's phone requesting that she contact pt/son to set up a SW or CM eval. to discuss pt's living situation due to spouse in the hospital. I requested that Ms. Sharlett Iles call us back to let us know they received our message and is working with pt

## 2016-02-02 DIAGNOSIS — J449 Chronic obstructive pulmonary disease, unspecified: Secondary | ICD-10-CM | POA: Diagnosis not present

## 2016-02-02 NOTE — Telephone Encounter (Signed)
[name] at [facility] called.  Magda Paganini the social worker is off today,but will go to home on Monday.

## 2016-02-03 ENCOUNTER — Other Ambulatory Visit: Payer: Self-pay | Admitting: Cardiovascular Disease

## 2016-02-05 DIAGNOSIS — D61818 Other pancytopenia: Secondary | ICD-10-CM | POA: Diagnosis not present

## 2016-02-05 DIAGNOSIS — I5032 Chronic diastolic (congestive) heart failure: Secondary | ICD-10-CM | POA: Diagnosis not present

## 2016-02-05 DIAGNOSIS — E1142 Type 2 diabetes mellitus with diabetic polyneuropathy: Secondary | ICD-10-CM | POA: Diagnosis not present

## 2016-02-05 DIAGNOSIS — I130 Hypertensive heart and chronic kidney disease with heart failure and stage 1 through stage 4 chronic kidney disease, or unspecified chronic kidney disease: Secondary | ICD-10-CM | POA: Diagnosis not present

## 2016-02-05 DIAGNOSIS — I2510 Atherosclerotic heart disease of native coronary artery without angina pectoris: Secondary | ICD-10-CM | POA: Diagnosis not present

## 2016-02-05 DIAGNOSIS — J449 Chronic obstructive pulmonary disease, unspecified: Secondary | ICD-10-CM | POA: Diagnosis not present

## 2016-02-05 DIAGNOSIS — K7469 Other cirrhosis of liver: Secondary | ICD-10-CM | POA: Diagnosis not present

## 2016-02-05 DIAGNOSIS — K7581 Nonalcoholic steatohepatitis (NASH): Secondary | ICD-10-CM | POA: Diagnosis not present

## 2016-02-05 DIAGNOSIS — E1122 Type 2 diabetes mellitus with diabetic chronic kidney disease: Secondary | ICD-10-CM | POA: Diagnosis not present

## 2016-02-05 DIAGNOSIS — N183 Chronic kidney disease, stage 3 (moderate): Secondary | ICD-10-CM | POA: Diagnosis not present

## 2016-02-05 DIAGNOSIS — M1991 Primary osteoarthritis, unspecified site: Secondary | ICD-10-CM | POA: Diagnosis not present

## 2016-02-05 DIAGNOSIS — J9611 Chronic respiratory failure with hypoxia: Secondary | ICD-10-CM | POA: Diagnosis not present

## 2016-02-05 DIAGNOSIS — M797 Fibromyalgia: Secondary | ICD-10-CM | POA: Diagnosis not present

## 2016-02-06 DIAGNOSIS — E1142 Type 2 diabetes mellitus with diabetic polyneuropathy: Secondary | ICD-10-CM | POA: Diagnosis not present

## 2016-02-06 DIAGNOSIS — I5032 Chronic diastolic (congestive) heart failure: Secondary | ICD-10-CM | POA: Diagnosis not present

## 2016-02-06 DIAGNOSIS — J449 Chronic obstructive pulmonary disease, unspecified: Secondary | ICD-10-CM | POA: Diagnosis not present

## 2016-02-06 DIAGNOSIS — M1991 Primary osteoarthritis, unspecified site: Secondary | ICD-10-CM | POA: Diagnosis not present

## 2016-02-06 DIAGNOSIS — K7581 Nonalcoholic steatohepatitis (NASH): Secondary | ICD-10-CM | POA: Diagnosis not present

## 2016-02-06 DIAGNOSIS — K7469 Other cirrhosis of liver: Secondary | ICD-10-CM | POA: Diagnosis not present

## 2016-02-06 DIAGNOSIS — J9611 Chronic respiratory failure with hypoxia: Secondary | ICD-10-CM | POA: Diagnosis not present

## 2016-02-06 DIAGNOSIS — I2510 Atherosclerotic heart disease of native coronary artery without angina pectoris: Secondary | ICD-10-CM | POA: Diagnosis not present

## 2016-02-06 DIAGNOSIS — N183 Chronic kidney disease, stage 3 (moderate): Secondary | ICD-10-CM | POA: Diagnosis not present

## 2016-02-06 DIAGNOSIS — I130 Hypertensive heart and chronic kidney disease with heart failure and stage 1 through stage 4 chronic kidney disease, or unspecified chronic kidney disease: Secondary | ICD-10-CM | POA: Diagnosis not present

## 2016-02-06 DIAGNOSIS — D61818 Other pancytopenia: Secondary | ICD-10-CM | POA: Diagnosis not present

## 2016-02-06 DIAGNOSIS — E1122 Type 2 diabetes mellitus with diabetic chronic kidney disease: Secondary | ICD-10-CM | POA: Diagnosis not present

## 2016-02-06 DIAGNOSIS — M797 Fibromyalgia: Secondary | ICD-10-CM | POA: Diagnosis not present

## 2016-02-07 DIAGNOSIS — M797 Fibromyalgia: Secondary | ICD-10-CM | POA: Diagnosis not present

## 2016-02-07 DIAGNOSIS — K7581 Nonalcoholic steatohepatitis (NASH): Secondary | ICD-10-CM | POA: Diagnosis not present

## 2016-02-07 DIAGNOSIS — E1122 Type 2 diabetes mellitus with diabetic chronic kidney disease: Secondary | ICD-10-CM | POA: Diagnosis not present

## 2016-02-07 DIAGNOSIS — I5032 Chronic diastolic (congestive) heart failure: Secondary | ICD-10-CM | POA: Diagnosis not present

## 2016-02-07 DIAGNOSIS — D61818 Other pancytopenia: Secondary | ICD-10-CM | POA: Diagnosis not present

## 2016-02-07 DIAGNOSIS — K7469 Other cirrhosis of liver: Secondary | ICD-10-CM | POA: Diagnosis not present

## 2016-02-07 DIAGNOSIS — J449 Chronic obstructive pulmonary disease, unspecified: Secondary | ICD-10-CM | POA: Diagnosis not present

## 2016-02-07 DIAGNOSIS — J9611 Chronic respiratory failure with hypoxia: Secondary | ICD-10-CM | POA: Diagnosis not present

## 2016-02-07 DIAGNOSIS — I130 Hypertensive heart and chronic kidney disease with heart failure and stage 1 through stage 4 chronic kidney disease, or unspecified chronic kidney disease: Secondary | ICD-10-CM | POA: Diagnosis not present

## 2016-02-07 DIAGNOSIS — I2510 Atherosclerotic heart disease of native coronary artery without angina pectoris: Secondary | ICD-10-CM | POA: Diagnosis not present

## 2016-02-07 DIAGNOSIS — E1142 Type 2 diabetes mellitus with diabetic polyneuropathy: Secondary | ICD-10-CM | POA: Diagnosis not present

## 2016-02-07 DIAGNOSIS — N183 Chronic kidney disease, stage 3 (moderate): Secondary | ICD-10-CM | POA: Diagnosis not present

## 2016-02-07 DIAGNOSIS — M1991 Primary osteoarthritis, unspecified site: Secondary | ICD-10-CM | POA: Diagnosis not present

## 2016-02-08 DIAGNOSIS — K7469 Other cirrhosis of liver: Secondary | ICD-10-CM | POA: Diagnosis not present

## 2016-02-08 DIAGNOSIS — D61818 Other pancytopenia: Secondary | ICD-10-CM | POA: Diagnosis not present

## 2016-02-08 DIAGNOSIS — M1991 Primary osteoarthritis, unspecified site: Secondary | ICD-10-CM | POA: Diagnosis not present

## 2016-02-08 DIAGNOSIS — E1142 Type 2 diabetes mellitus with diabetic polyneuropathy: Secondary | ICD-10-CM | POA: Diagnosis not present

## 2016-02-08 DIAGNOSIS — I2510 Atherosclerotic heart disease of native coronary artery without angina pectoris: Secondary | ICD-10-CM | POA: Diagnosis not present

## 2016-02-08 DIAGNOSIS — M797 Fibromyalgia: Secondary | ICD-10-CM | POA: Diagnosis not present

## 2016-02-08 DIAGNOSIS — I130 Hypertensive heart and chronic kidney disease with heart failure and stage 1 through stage 4 chronic kidney disease, or unspecified chronic kidney disease: Secondary | ICD-10-CM | POA: Diagnosis not present

## 2016-02-08 DIAGNOSIS — J449 Chronic obstructive pulmonary disease, unspecified: Secondary | ICD-10-CM | POA: Diagnosis not present

## 2016-02-08 DIAGNOSIS — J9611 Chronic respiratory failure with hypoxia: Secondary | ICD-10-CM | POA: Diagnosis not present

## 2016-02-08 DIAGNOSIS — E1122 Type 2 diabetes mellitus with diabetic chronic kidney disease: Secondary | ICD-10-CM | POA: Diagnosis not present

## 2016-02-08 DIAGNOSIS — N183 Chronic kidney disease, stage 3 (moderate): Secondary | ICD-10-CM | POA: Diagnosis not present

## 2016-02-08 DIAGNOSIS — I5032 Chronic diastolic (congestive) heart failure: Secondary | ICD-10-CM | POA: Diagnosis not present

## 2016-02-08 DIAGNOSIS — K7581 Nonalcoholic steatohepatitis (NASH): Secondary | ICD-10-CM | POA: Diagnosis not present

## 2016-02-12 ENCOUNTER — Other Ambulatory Visit: Payer: Self-pay | Admitting: Family Medicine

## 2016-02-12 NOTE — Telephone Encounter (Signed)
Ok to refill 

## 2016-02-13 DIAGNOSIS — N183 Chronic kidney disease, stage 3 (moderate): Secondary | ICD-10-CM | POA: Diagnosis not present

## 2016-02-13 DIAGNOSIS — E1122 Type 2 diabetes mellitus with diabetic chronic kidney disease: Secondary | ICD-10-CM | POA: Diagnosis not present

## 2016-02-13 DIAGNOSIS — I5032 Chronic diastolic (congestive) heart failure: Secondary | ICD-10-CM | POA: Diagnosis not present

## 2016-02-13 DIAGNOSIS — K7581 Nonalcoholic steatohepatitis (NASH): Secondary | ICD-10-CM | POA: Diagnosis not present

## 2016-02-13 DIAGNOSIS — J9611 Chronic respiratory failure with hypoxia: Secondary | ICD-10-CM | POA: Diagnosis not present

## 2016-02-13 DIAGNOSIS — M797 Fibromyalgia: Secondary | ICD-10-CM | POA: Diagnosis not present

## 2016-02-13 DIAGNOSIS — E1142 Type 2 diabetes mellitus with diabetic polyneuropathy: Secondary | ICD-10-CM | POA: Diagnosis not present

## 2016-02-13 DIAGNOSIS — M1991 Primary osteoarthritis, unspecified site: Secondary | ICD-10-CM | POA: Diagnosis not present

## 2016-02-13 DIAGNOSIS — D61818 Other pancytopenia: Secondary | ICD-10-CM | POA: Diagnosis not present

## 2016-02-13 DIAGNOSIS — I2510 Atherosclerotic heart disease of native coronary artery without angina pectoris: Secondary | ICD-10-CM | POA: Diagnosis not present

## 2016-02-13 DIAGNOSIS — K7469 Other cirrhosis of liver: Secondary | ICD-10-CM | POA: Diagnosis not present

## 2016-02-13 DIAGNOSIS — J449 Chronic obstructive pulmonary disease, unspecified: Secondary | ICD-10-CM | POA: Diagnosis not present

## 2016-02-13 DIAGNOSIS — I130 Hypertensive heart and chronic kidney disease with heart failure and stage 1 through stage 4 chronic kidney disease, or unspecified chronic kidney disease: Secondary | ICD-10-CM | POA: Diagnosis not present

## 2016-02-14 ENCOUNTER — Encounter (HOSPITAL_COMMUNITY)
Admission: RE | Admit: 2016-02-14 | Discharge: 2016-02-14 | Disposition: A | Discharge status: Home / Self Care | Payer: Medicare Other | Source: Ambulatory Visit | Attending: Family Medicine | Admitting: Family Medicine

## 2016-02-14 DIAGNOSIS — D649 Anemia, unspecified: Secondary | ICD-10-CM | POA: Diagnosis not present

## 2016-02-14 DIAGNOSIS — Q2733 Arteriovenous malformation of digestive system vessel: Secondary | ICD-10-CM | POA: Diagnosis not present

## 2016-02-14 DIAGNOSIS — N289 Disorder of kidney and ureter, unspecified: Secondary | ICD-10-CM | POA: Diagnosis not present

## 2016-02-14 DIAGNOSIS — K7460 Unspecified cirrhosis of liver: Secondary | ICD-10-CM | POA: Diagnosis not present

## 2016-02-14 LAB — POCT HEMOGLOBIN-HEMACUE: Hemoglobin: 8.3 g/dL — ABNORMAL LOW (ref 12.0–15.0)

## 2016-02-14 MED ORDER — DARBEPOETIN ALFA 200 MCG/0.4ML IJ SOSY
PREFILLED_SYRINGE | INTRAMUSCULAR | Status: DC
Start: 2016-02-14 — End: 2016-02-15
  Filled 2016-02-14: qty 0.4

## 2016-02-14 MED ORDER — DARBEPOETIN ALFA 200 MCG/0.4ML IJ SOSY
200.0000 ug | PREFILLED_SYRINGE | INTRAMUSCULAR | Status: DC
  Administered 2016-02-14: 200 ug via SUBCUTANEOUS

## 2016-02-15 DIAGNOSIS — M797 Fibromyalgia: Secondary | ICD-10-CM | POA: Diagnosis not present

## 2016-02-15 DIAGNOSIS — E1122 Type 2 diabetes mellitus with diabetic chronic kidney disease: Secondary | ICD-10-CM | POA: Diagnosis not present

## 2016-02-15 DIAGNOSIS — M1991 Primary osteoarthritis, unspecified site: Secondary | ICD-10-CM | POA: Diagnosis not present

## 2016-02-15 DIAGNOSIS — N183 Chronic kidney disease, stage 3 (moderate): Secondary | ICD-10-CM | POA: Diagnosis not present

## 2016-02-15 DIAGNOSIS — D61818 Other pancytopenia: Secondary | ICD-10-CM | POA: Diagnosis not present

## 2016-02-15 DIAGNOSIS — J9611 Chronic respiratory failure with hypoxia: Secondary | ICD-10-CM | POA: Diagnosis not present

## 2016-02-15 DIAGNOSIS — I2510 Atherosclerotic heart disease of native coronary artery without angina pectoris: Secondary | ICD-10-CM | POA: Diagnosis not present

## 2016-02-15 DIAGNOSIS — J449 Chronic obstructive pulmonary disease, unspecified: Secondary | ICD-10-CM | POA: Diagnosis not present

## 2016-02-15 DIAGNOSIS — I5032 Chronic diastolic (congestive) heart failure: Secondary | ICD-10-CM | POA: Diagnosis not present

## 2016-02-15 DIAGNOSIS — K7469 Other cirrhosis of liver: Secondary | ICD-10-CM | POA: Diagnosis not present

## 2016-02-15 DIAGNOSIS — I130 Hypertensive heart and chronic kidney disease with heart failure and stage 1 through stage 4 chronic kidney disease, or unspecified chronic kidney disease: Secondary | ICD-10-CM | POA: Diagnosis not present

## 2016-02-15 DIAGNOSIS — E1142 Type 2 diabetes mellitus with diabetic polyneuropathy: Secondary | ICD-10-CM | POA: Diagnosis not present

## 2016-02-15 DIAGNOSIS — K7581 Nonalcoholic steatohepatitis (NASH): Secondary | ICD-10-CM | POA: Diagnosis not present

## 2016-02-16 ENCOUNTER — Telehealth: Payer: Self-pay

## 2016-02-16 ENCOUNTER — Ambulatory Visit (INDEPENDENT_AMBULATORY_CARE_PROVIDER_SITE_OTHER): Payer: Medicare Other | Admitting: Cardiovascular Disease

## 2016-02-16 ENCOUNTER — Encounter: Payer: Self-pay | Admitting: Cardiovascular Disease

## 2016-02-16 VITALS — BP 140/50 | HR 72 | Ht 62.5 in | Wt 210.4 lb

## 2016-02-16 DIAGNOSIS — I5032 Chronic diastolic (congestive) heart failure: Secondary | ICD-10-CM | POA: Diagnosis not present

## 2016-02-16 MED ORDER — NITROGLYCERIN 0.4 MG SL SUBL: 0.4000 mg | SUBLINGUAL_TABLET | SUBLINGUAL | Status: AC | PRN

## 2016-02-16 NOTE — Patient Instructions (Signed)
Medication Instructions:  Your physician recommends that you continue on your current medications as directed. Please refer to the Current Medication list given to you today.  Labwork: Your physician recommends that you return for lab work in: 6-8 WEEKS (BMP)  Testing/Procedures: No new orders.   Follow-Up: Your physician recommends that you schedule a follow-up appointment in: 6-8 WEEKS with Richardson Dopp PA-C    Any Other Special Instructions Will Be Listed Below (If Applicable).     If you need a refill on your cardiac medications before your next appointment, please call your pharmacy.
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
## 2016-02-16 NOTE — Progress Notes (Signed)
Cardiology Office Note Date:  02/16/2016   ID:  [name], [name] 1941/06/13, MRN [id number]  PCP:  Ria Bush, MD  Cardiologist:  Sherren Mocha, MD    Chief Complaint  Patient presents with  . Essential Hypertension  . Aortic Valve Disorder  . Chronic Diastolic Heart Failure     History of Present Illness: [name] is a 75 y.o. 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  The patient continues to decline. She's had a really difficult time lately. Her husband was in an accident and had severe injuries with a pneumothorax and multiple rib fractures. He was in the intensive care unit and [facility] She thinks she will be discharged to a skilled nursing facility pretty soon and she likely will move in to the nursing facility with him when that occurs. She is here with her daughter today.  She has multiple complaints. She continues to have shortness of breath, generalized weakness, and episodic chest discomfort. She hasn't had to take much nitroglycerin and sometimes goes weeks without having to take any. Her chest pain pattern is stable. Her weight went up last week but she took metolazone and it is now back down to baseline. Her chronic leg edema is unchanged.  Past Medical History  Diagnosis Date  . Chronic airway obstruction, not elsewhere classified   . Unspecified chronic bronchitis ([place])   . Unspecified essential hypertension   . Non-obstructive CAD   . Palpitations   . Mild aortic stenosis     a. 03/2014 Valve area (VTI): 2.89 cm^2, Valve area (Vmax): 2.7 cm^2.  . Pure hypercholesterolemia   . Morbid obesity ([place])   . Esophageal reflux   . GAVE (gastric antral vascular ectasia)     angiodysplasia; s/p multiple APCs, discussing RFA  with [facility] Dr Newman Pies (06/2015)  . Diverticulosis of colon (without mention of hemorrhage)   . Benign neoplasm of colon   . Osteoarthrosis, unspecified whether generalized or localized, unspecified site   . Osteoporosis, unspecified   . Restless legs syndrome (RLS)   . Major depressive disorder, recurrent episode, severe, specified as with psychotic behavior   . Iron deficiency anemia secondary to blood loss (chronic)     a. frequent PRBC transfusions.  . Coarse tremors     a. arms.  . Carpal tunnel syndrome on both sides   . Chronic diastolic CHF (congestive heart failure) ([place])     a. 03/26/2014 Echo: EF 55-60%, no rwma, mild AS/AI, mod-sev Ca2+ MV annulus, mildly to mod dil LA.  [name] Asthma   . Type II diabetes mellitus ([place])   . H/O hiatal hernia   . 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 dx'd 1990's  . Adenomatous colon polyp   . Midsternal chest pain     a. conservatively managed ->poor candidate for cath/anticoagulation.  [name] Anxiety   . Portal hypertensive gastropathy   . Falls frequently     completed HHPT/OT 06/2014, PT unmet goals  . Recurrent UTI (urinary tract infection)     h/o hospitalization with urosepsis 2015, but thought large component colonization/bacteriuria, only treat if symptomatic ([place])  . Hiatal hernia   . Thrombocytopenia ([place])   . Protein calorie malnutrition ([place])   . On home oxygen therapy     "  3L; 24/7" (11/29/2015)  . OSA (obstructive sleep apnea)     a. does not use CPAP. (01/25/2015)  . Hepatitis   . CAP (community acquired pneumonia) 2015    Past Surgical History  Procedure Laterality Date  . Hemorroidectomy    . Appendectomy    . Vaginal hysterectomy    . Breast biopsy      bilateral  . Cesarean section      x 3  . Appendectomy    . Esophagogastroduodenoscopy  06/12/2012    Procedure: ESOPHAGOGASTRODUODENOSCOPY (EGD);  Surgeon: Lafayette Dragon, MD;  Location: [facility];  Service: Endoscopy;   Laterality: N/A;  . Balloon dilation  06/12/2012    Procedure: BALLOON DILATION;  Surgeon: Lafayette Dragon, MD;  Location: [facility];  Service: Endoscopy;  Laterality: N/A;  ?balloon  . Esophagogastroduodenoscopy  09/10/2012    Procedure: ESOPHAGOGASTRODUODENOSCOPY (EGD);  Surgeon: Lafayette Dragon, MD;  Location: [facility];  Service: Endoscopy;  Laterality: N/A;  . Hot hemostasis  09/10/2012    Procedure: HOT HEMOSTASIS (ARGON PLASMA COAGULATION/BICAP);  Surgeon: Lafayette Dragon, MD;  Location: [facility];  Service: Endoscopy;  Laterality: N/A;  . Esophagogastroduodenoscopy N/A 01/18/2013    Procedure: ESOPHAGOGASTRODUODENOSCOPY (EGD);  Surgeon: Lafayette Dragon, MD;  Location: [facility] ENDOSCOPY;  Service: Endoscopy;  Laterality: N/A;  . Esophagogastroduodenoscopy N/A 05/24/2013    Procedure: ESOPHAGOGASTRODUODENOSCOPY (EGD);  Surgeon: Jerene Bears, MD;  Location: [place];  Service: Gastroenterology;  Laterality: N/A;  . Hot hemostasis N/A 05/24/2013    Procedure: HOT HEMOSTASIS (ARGON PLASMA COAGULATION/BICAP);  Surgeon: Jerene Bears, MD;  Location: [place];  Service: Gastroenterology;  Laterality: N/A;  . US echocardiography  07/2014    mild LVH, EF 60-65%, normal wall motion, mild AR, mod dilated LA, mildly dilated RA, peak PA pressure 66mmHg  . Esophagogastroduodenoscopy N/A 10/20/2014    Procedure: ESOPHAGOGASTRODUODENOSCOPY (EGD);  Surgeon: Lafayette Dragon, MD;  Location: [facility];  Service: Endoscopy;  Laterality: N/A;  . Hot hemostasis N/A 10/20/2014    Procedure: HOT HEMOSTASIS (ARGON PLASMA COAGULATION/BICAP);  Surgeon: Lafayette Dragon, MD;  Location: [facility];  Service: Endoscopy;  Laterality: N/A;  . Enteroscopy N/A 02/25/2015    Procedure: ENTEROSCOPY;  Surgeon: [name] Ada, MD;  Location: [place];  Service: Endoscopy;  Laterality: N/A;  . Enteroscopy N/A 06/27/2015    Procedure: ENTEROSCOPY;  Surgeon: Manus Gunning, MD;  Location: [facility];  Service:  Gastroenterology;  Laterality: N/A;  . Eus  09/2015    antral CAVL, ablated; nodular erosive gastritis; mult pancreatic cysts rec rpt CT pancreas protocol or MRI to survey pancreas cysts 1-2 yrs (Dr Amie Critchley)    Current Outpatient Prescriptions  Medication Sig Dispense Refill  . ADVAIR DISKUS 250-50 MCG/DOSE AEPB INHALE 1 PUFF INTO LUNGS TWICE A DAY 60 each 11  . albuterol (PROAIR HFA) 108 (90 BASE) MCG/ACT inhaler Inhale 2 puffs into the lungs every 6 (six) hours as needed for wheezing or shortness of breath. Reported on 08/18/2015    . ALPRAZolam (XANAX) 1 MG tablet TAKE 1/2-1 TAB BY MOUTH 2 TIMES DAILY AS NEEDED FOR ANXIETY OR SLEEP 60 tablet 0  . B Complex-C ([employer] B COMPLEX PLUS C) TABS TAKE ONE CAPSULE BY MOUTH EVERY DAY 30 tablet 6  . buPROPion (WELLBUTRIN XL) 150 MG 24 hr tablet Take 150 mg by mouth daily.    [name] desvenlafaxine (PRISTIQ) 50 MG 24 hr tablet Take 3 tablets (150 mg total) by mouth daily. 90 tablet 11  .  diclofenac sodium (VOLTAREN) 1 % GEL Apply 1 application topically 3 (three) times daily. 1 Tube 1  . ferrous sulfate 325 (65 FE) MG tablet TAKE 1 TABLET BY MOUTH EVERY DAY WITH BREAKFAST 90 tablet 1  . glucose blood (ONE TOUCH ULTRA TEST) test strip Check blood sugar 4 times daily 200 each 3  . guaiFENesin (MUCINEX) 600 MG 12 hr tablet Take 600 mg by mouth 2 (two) times daily.     [name] guaiFENesin-codeine 100-10 MG/5ML syrup Take 5 mLs by mouth 2 (two) times daily as needed. cough  0  . Insulin Glargine (LANTUS SOLOSTAR) 100 UNIT/ML Solostar Pen Inject 120 Units into the skin every morning. 20 pen 11  . insulin lispro (HUMALOG KWIKPEN) 100 UNIT/ML KiwkPen Inject 5 Units into the skin 3 (three) times daily with meals. Take only if blood sugar is over 300    . Insulin Pen Needle (B-D ULTRAFINE III SHORT PEN) 31G X 8 MM MISC Use as directed to inject insulin twice daily. Dx: E11.8 200 each 2  . isosorbide mononitrate (IMDUR) 30 MG 24 hr tablet Take 0.5 tablets (15 mg total) by mouth  daily. 30 tablet 6  . lactulose, encephalopathy, (CHRONULAC) 10 GM/15ML SOLN Take 20 g by mouth 3 (three) times daily.     [name] levalbuterol (XOPENEX) 0.63 MG/3ML nebulizer solution Take 0.63 mg by nebulization every 8 (eight) hours as needed for wheezing or shortness of breath.   5  . loratadine (CLARITIN) 10 MG tablet TAKE 1 TABLET BY MOUTH EVERY DAY 30 tablet 11  . metolazone (ZAROXOLYN) 2.5 MG tablet TAKE AS DIRECTED FOR 4 LB WEIGHT GAIN 15 tablet 11  . Multiple Vitamins-Minerals (CENTRUM SILVER PO) Take 1 tablet by mouth daily.     . mupirocin cream (BACTROBAN) 2 % Apply 1 application topically 2 (two) times daily. Reported on 11/14/2015    . nitroGLYCERIN (NITROSTAT) 0.4 MG SL tablet Place 1 tablet (0.4 mg total) under the tongue every 5 (five) minutes as needed for chest pain. No more than 3 in 15 minutes 25 tablet 1  . ONETOUCH DELICA LANCETS 99991111 MISC USE TO CHECK SUGAR 4 TIMES DAILY AND AS NEEDED. 123456 **ONE TOUCH DELICA** A999333 each 1  . oxyCODONE (OXY IR/ROXICODONE) 5 MG immediate release tablet Take 1 tablet (5 mg total) by mouth every 6 (six) hours as needed for moderate pain or severe pain. 60 tablet 0  . oxycodone (OXY-IR) 5 MG capsule Take 5 mg by mouth every 6 (six) hours as needed. Pain  0  . pantoprazole (PROTONIX) 40 MG tablet TAKE 1 TABLET BY MOUTH TWICE A DAY 60 tablet 11  . potassium chloride SA (K-DUR,KLOR-CON) 20 MEQ tablet Take 40 mEq by mouth 2 (two) times daily.     . promethazine (PHENERGAN) 25 MG tablet Take 25 mg by mouth 2 (two) times daily as needed for nausea or vomiting.    [name] QUEtiapine (SEROQUEL) 200 MG tablet TAKE 1 TABLET BY MOUTH AT BEDTIME 30 tablet 3  . rifaximin (XIFAXAN) 550 MG TABS tablet Take 550 mg by mouth 2 (two) times daily. Reported on 10/17/2015    . rOPINIRole (REQUIP) 4 MG tablet TAKE 1 TABLET BY MOUTH AT BEDTIME 30 tablet 6  . simvastatin (ZOCOR) 40 MG tablet Take 40 mg by mouth daily.    [name] torsemide (DEMADEX) 20 MG tablet Take 40 mg by mouth  daily.    . Vitamin D, Ergocalciferol, (DRISDOL) 50000 units CAPS capsule Take 50,000 Units by mouth once  a week. On Fridays  6   No current facility-administered medications for this visit.    Allergies:   Acetaminophen; Nsaids; Aspirin; Theophylline; Penicillin g; and Penicillins   Social History:  The patient  reports that she quit smoking about 23 years ago. Her smoking use included Cigarettes. She has a 37.5 pack-year smoking history. She has never used smokeless tobacco. She reports that she does not drink alcohol or use illicit drugs.   Family History:  The patient's  family history includes Breast cancer in her maternal aunt and sister; Cervical cancer in her mother; Diabetes in her father, mother, and sister; Heart attack in her father and mother; Heart disease in her mother; Kidney disease in her mother; Multiple sclerosis in her other and sister; Stroke in her daughter and daughter. There is no history of Colon cancer.    ROS:  Please see the history of present illness.  Otherwise, review of systems is positive for Weight gain, appetite change, chills, chest pain, leg swelling, PND, orthopnea, visual changes, abdominal pain, depression, back pain, muscle pain, easy bruising, excessive fatigue, leg pain, palpitations, nausea, anxiety, balance problems, bleeding.  All other systems are reviewed and negative.    PHYSICAL EXAM: VS:  BP 140/50 mmHg  Pulse 72  Ht 5' 2.5" (1.588 m)  Wt 210 lb 6.4 oz (95.437 kg)  BMI 37.85 kg/m2 , BMI Body mass index is 37.85 kg/(m^2). GEN: Obese, chronically ill-appearing woman, O2 dependent, in a wheelchair, in no acute distress HEENT: normal Neck: JVP is mildly elevated, no masses.  Cardiac: RRR with grade 2/6 harsh systolic ejection murmur at the right upper sternal border             Respiratory:  clear to auscultation bilaterally, normal work of breathing GI: soft, nontender, nondistended, + BS MS: no deformity or atrophy Ext: Trace pretibial  edema on the right, 1+ pretibial edema on the left Skin: warm and dry, no rash Neuro:  Strength and sensation are intact Psych: euthymic mood, full affect  EKG:  EKG is not ordered today.  Recent Labs: 08/28/2015: TSH 1.411 09/01/2015: Magnesium 2.2 11/21/2015: Pro B Natriuretic peptide (BNP) 72.0 11/28/2015: B Natriuretic Peptide 99.0 12/11/2015: ALT 22; Platelets 98.0* 12/26/2015: BUN 44*; Creat 1.19; Potassium 3.5; Sodium 141 02/14/2016: Hemoglobin 8.3*   Lipid Panel     Component Value Date/Time   CHOL 115 12/13/2014 0902   TRIG 113.0 12/13/2014 0902   HDL 40.50 12/13/2014 0902   CHOLHDL 3 12/13/2014 0902   VLDL 22.6 12/13/2014 0902   LDLCALC 52 12/13/2014 0902      Wt Readings from Last 3 Encounters:  02/16/16 210 lb 6.4 oz (95.437 kg)  01/26/16 215 lb 8 oz (97.75 kg)  01/11/16 214 lb 12 oz (97.41 kg)     Cardiac Studies Reviewed: 2D Echo 11-02-2015: Study Conclusions  - Left ventricle: The cavity size was normal. There was moderate  concentric hypertrophy. Systolic function was normal. The  estimated ejection fraction was in the range of 60% to 65%. Wall  motion was normal; there were no regional wall motion  abnormalities. Features are consistent with a pseudonormal left  ventricular filling pattern, with concomitant abnormal relaxation  and increased filling pressure (grade 2 diastolic dysfunction).  Doppler parameters are consistent with elevated mean left atrial  filling pressure. - Aortic valve: There was mild stenosis. There was mild  regurgitation. Peak velocity (S): 240 cm/s. Mean gradient (S): 12  mm Hg. Valve area (VTI): 2.14 cm^2. Valve area (  Vmax): 2.34 cm^2.  Valve area (Vmean): 2.25 cm^2. Regurgitation pressure half-time:  748 ms. - Mitral valve: Severe thickening and calcification, mostly in the  posterior annulus and posterior leaflet. Transvalvular velocity  was within the normal range. There was no evidence for stenosis.  Valve area by  continuity equation (using LVOT flow): 2.79 cm^2. - Left atrium: The atrium was severely dilated. - Right ventricle: The cavity size was normal. Wall thickness was  normal. Systolic function was normal. - Right atrium: The atrium was mildly dilated. - Tricuspid valve: There was trivial regurgitation. - Pulmonic valve: There was trivial regurgitation. - Inferior vena cava: The vessel was normal in size. The  respirophasic diameter changes were in the normal range (>= 50%),  consistent with normal central venous pressure.  ASSESSMENT AND PLAN: 1.  Chronic diastolic heart failure, New York Heart Association functional class IIIB: Multiple comorbid conditions are present. I think the patient is getting along as good as she possibly can. She is O2 dependent and extremely limited in her mobility. Her weight is stable and volume status also appears stable. Last metabolic panel was done May 2 and this is reviewed. Her creatinine is 1.2 and BUN is elevated at 44. She will continue her current medical program with torsemide 40 mg daily and when necessary use of metolazone with a 3 pound or greater weight gain. I would like her to follow-up with Richardson Dopp in 6-8 weeks and we'll draw metabolic panel at that time.  2. Chest pain: Patient with nonobstructive CAD. There is a component of nitroglycerin responsiveness and her prescription is updated today. Suspect that chronic anemia and other medical problems contribute. Ongoing medical therapy and conservative care are appropriate.  3. Hypertension with chronic kidney disease and heart failure: Blood pressure is controlled.  4. Chronic GI blood loss/iron deficiency anemia: Labs reviewed. Followed closely by primary care and GI.  5. COPD, O2 dependent  Current medicines are reviewed with the patient today.  The patient does not have concerns regarding medicines.  Labs/ tests ordered today include:   Orders Placed This Encounter  Procedures  . Basic  Metabolic Panel (BMET)   Disposition:   FU 6-8 weeks with Tresea Mall, Sherren Mocha, MD  02/16/2016 3:25 PM    [facility] [place], [place], [place]  [address] Phone: [phone]; Fax: [phone]

## 2016-02-19 ENCOUNTER — Ambulatory Visit (INDEPENDENT_AMBULATORY_CARE_PROVIDER_SITE_OTHER): Payer: Medicare Other | Admitting: Pulmonary Disease

## 2016-02-19 ENCOUNTER — Encounter: Payer: Self-pay | Admitting: Pulmonary Disease

## 2016-02-19 ENCOUNTER — Encounter: Payer: Self-pay | Admitting: *Deleted

## 2016-02-19 VITALS — BP 134/66 | HR 67 | Temp 98.3°F | Ht 62.5 in | Wt 209.2 lb

## 2016-02-19 DIAGNOSIS — M1991 Primary osteoarthritis, unspecified site: Secondary | ICD-10-CM | POA: Diagnosis not present

## 2016-02-19 DIAGNOSIS — I5032 Chronic diastolic (congestive) heart failure: Secondary | ICD-10-CM | POA: Diagnosis not present

## 2016-02-19 DIAGNOSIS — M797 Fibromyalgia: Secondary | ICD-10-CM | POA: Diagnosis not present

## 2016-02-19 DIAGNOSIS — R5381 Other malaise: Secondary | ICD-10-CM | POA: Diagnosis not present

## 2016-02-19 DIAGNOSIS — N183 Chronic kidney disease, stage 3 (moderate): Secondary | ICD-10-CM | POA: Diagnosis not present

## 2016-02-19 DIAGNOSIS — J449 Chronic obstructive pulmonary disease, unspecified: Secondary | ICD-10-CM | POA: Diagnosis not present

## 2016-02-19 DIAGNOSIS — D61818 Other pancytopenia: Secondary | ICD-10-CM | POA: Diagnosis not present

## 2016-02-19 DIAGNOSIS — E1122 Type 2 diabetes mellitus with diabetic chronic kidney disease: Secondary | ICD-10-CM | POA: Diagnosis not present

## 2016-02-19 DIAGNOSIS — I130 Hypertensive heart and chronic kidney disease with heart failure and stage 1 through stage 4 chronic kidney disease, or unspecified chronic kidney disease: Secondary | ICD-10-CM | POA: Diagnosis not present

## 2016-02-19 DIAGNOSIS — J9611 Chronic respiratory failure with hypoxia: Secondary | ICD-10-CM | POA: Insufficient documentation

## 2016-02-19 DIAGNOSIS — I2510 Atherosclerotic heart disease of native coronary artery without angina pectoris: Secondary | ICD-10-CM | POA: Diagnosis not present

## 2016-02-19 DIAGNOSIS — K7581 Nonalcoholic steatohepatitis (NASH): Secondary | ICD-10-CM | POA: Diagnosis not present

## 2016-02-19 DIAGNOSIS — K7469 Other cirrhosis of liver: Secondary | ICD-10-CM | POA: Diagnosis not present

## 2016-02-19 DIAGNOSIS — E1142 Type 2 diabetes mellitus with diabetic polyneuropathy: Secondary | ICD-10-CM | POA: Diagnosis not present

## 2016-02-19 NOTE — Patient Instructions (Signed)
Today we updated your med list in our EPIC system...    Continue your current medications the same...  Continue your Advair, NEBULIZER, and Proair-HFA as directed...  Keep up the good effort at physical activity & exercise...  Call for any questions...  Let's plan a follow up visit in 42mo, sooner if needed for problems.Marland KitchenMarland Kitchen

## 2016-02-19 NOTE — Progress Notes (Signed)
Subjective:    Patient ID: [name], female    DOB: 01/25/1941, 75 y.o.   MRN: [id number]  HPI 75 y/o WF here for a follow up visit... she has mult med problems as noted below>  ~  SEE PREV EPIC NOTES FOR OLDER DATA >>    LABS 1/14:  CBC- Hg=7.0 (down 4pts) Hct=21.9 Fe=51 (11%sat) B12=623, Protime=11.6/ 1.1, AFP=pending; Abd sonar per DrDBrodie- pending...  LABS 7/14:  CBC- Hg=8.8, MCV=93, WBC=3.2, Plat=74K;  Fe=58 (14%sat), Ferritin=12.5.Marland KitchenMarland Kitchen  NOTE>> Fe is dropping & we decided to infuse FERAHEME 569m x2 as outpt... ROV 136mo CXR 7/14 showed norm heart size, clear lungs, no edema etc...  LABS 8/14:  Chems- ok x BS=257 Cr=1.4;  CBC- Hg=8.8 Fe=138 (35%sat)...   ~  October 13, 2013:      COPD> on Home O2, [id number], Mucinex; remote smoker quit yrs ago; known obstructive & restrictive lung dis; CXR 2/15 showed mild cardiomeg, mild vasc congestion, left mid lung scarring & peribronch thickening, otherw clear/NAD; CTA 3/13 was neg for PE & showed some atelec, coronary calcif, multilevel spondylosis, & cirrhosis of the liver; she continues on O2 by [place] at 3L/min...     HBP> on Corgard20-1/2, Lisinopril5, Lasix20-2Bid, K10Qod; BP= 122/68 & she has mult somatic complaints but no specific CP, palpit, ch in SOB or edema; continue low sodium & wt reduction.     CAD, DiastolicCHF, & mild AS/AI> known non-obstructive CAD; hx atypCP/ CWP; no angina & she remains way too sedentary; discussed risk factor reduction strategy & she saw [facility] 12/14> fragile status, chr dyspnea, no angina, too sedentary; last 2DEcho 7/13 w/ mild LVH, norm LVF w/ EF=55-60% & norm wall motion, mild AS&AI, Gr2DD;  EKG 12/14 w/ SBrady, rate47, 1st degree AVB, NSSTTWA; Corgard decr from 20=>1066m...    Chol> on Simva40; not really on diet & we discussed this; last FLP 5/13 showed TChol 94, TG 130, HDL 32, LDL 36; continue same, needs exercise, & ret for FLP f/u...    DM> on Lantus&Humalog per DrEllison; needs much  better diet & exercise program! Not checking BS at home; labs from [id number] [facility] BS=130-260 and last A1c was 1/15= 6.5    Obesity> way too sedentary, ?on diet & her wt is 230# today; we discussed diet, exercise, wt reduction strategies...    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    DJD/ FM/ ?osteopenia/ VitD defic> she has mult somatic complaints & takes Tylenol prn; on MVI + VitD 50K weekly...    RLS> on Requip4Qhs & off Sinemet from DrDohmeier w/ fair control of symptoms- prev neuro note is reviewed...    Anxiety/ Depression/ PsycheHx> on RX per Psychiatry DrHejazi (we don't have notes from him) & she does not know her meds, didn't bring med bottles or med list!  As best we can tell she is on Pristiq100, WellbutrinXL150, Seroquel100-4Qhs, Xanax1mg15mn- all from Psychiatry & she is reminded to bring all bottles to every visit...  ~  Jan 10, 2014:  37mo 50mo& MarioUlandaow followed for PrimaryCare by DrCopeland/Gutierez  at [facility], and she continues to see DrCooper for Cards, DrDBrodie for GI> their notes are reviewed...    Here for a 31moROV Pulmonary follow up- Combined obstructive & restrictive lung disease> on Home O2, Advair250, NEBS, Mucinex; remote smoker quit yrs ago; known obstructive & restrictive lung dis by PFTs; CXR 2/15 showed mild cardiomeg, mild vasc congestion, left mid lung scarring & peribronch thickening, otherw clear/NAD; CTA 3/13 was neg for PE & showed some atelec, coronary calcif, multilevel spondylosis, & cirrhosis of the liver; she continues on O2 by [place] at 3L/min...     Breathing has been stable and chest  sounds good;  She has not been taking the Mucinex regularly 7 is advised 2Bid w/ fluids;  She desperately needs to lose weight but is unable to exercise she says, noted DOE w/ ADLs but has not been exercising- rec to do chair exercises, the advance to walking, stairs in a progressive fashion...    [facility], periodic transfusions, regular Procrit injections, etc...   ~  May 10, 2014:  475moOV & Jacy was Adm 7/31 - 03/31/14 by [facility] w/ UTI, hi fever, encephalopathy, fall w/ sternal fx & 2 right rib fxs, superimposed on her chronic issues of COPD, chr diastolic CHF, cirrhosis, GIB/ AVMs, anemia, etc;  She recovered w/ antibiotics, fluids, supportive care and went for NHP/rehab where she was managed by [place] Since disch she has seen DrGuttierez for Primary Care & his notes are reviewed...     From the pulmonary standpoint> MaArlysemains on O2 at 3L/min by  (O2sat=99%), Advair250Bid, Mucinex 600Bid, & ProventilHFA rescue inhaler as needed (ave 1-2 daily);  She reports that her breathing is about at baseline;  Exam shows decr BS bilat w/o wheezing/ rales/ signs of consolidation;  She is rec to continue same meds and concentrate on deep breaths to keep lungs well expanded (Incentive spirometry);  She is doing home PT & slowly improved...     She continues to follow up w/ Cards for her non-obstructive CAD, mild AS, diastolic CHF, obesity (wt is actually down 15# to 219# today);  Med list includes: Corgard20, Lasix40Bid, Aldactone25-1/2 tab/d; BP= 116/68 & she is still very sore...    She is followed for GI by DrDBrodie; cirrhosis likely from steatohepatis, portal HTN, gastropathy & AVMs s/p ablation; meds include: Xifaxan, Lactulose, Aldactone...    DrGuttierez is monitoring her blood counts (Hg was down to 7.5=>9.3), renal function (Cr was up to 3 & is now back to norm=0.9), etc & provides PC transfusions when needed & Aranesp Q2wks...  We  reviewed prob list, meds, xrays and labs> see below for updates >>   CXR 7/28 - 03/28/14 showed mid sternal fx, right lat rib fx, right perihilar opac- ?consolidation, mild interstitial edema, cardiomegaly, prob pulm arterial HTN...   CT Chest 03/25/14 showed sternal fx w/ sm hematoma, acute fx of right lat 9th rib, plus additional old rib fxs, patchy atx bilat, cirrhotic liver & enlarged spleen  2DEcho 8/15 showed norm LV size & function w/ EF=55-60%, no regional wall motion abn, mildly thickened AV leaflets, mild AS & Ai, calcif of mitral annulus, mod LA dil...  ~  September 09, 2014:  30m69moV & Prisca has seen DrGutierrez several times for Primary Care (DM, UTIs, RI, Anemia on Aranesp, anxiety, etc), GI- LoriH (cirrhosis, portal hypertensive gastropathy, AVMs, chr GI blood loss), DrCooper for Cards (CAD, diastolic CH, mildAS);  She was Hosp again 12/17 - 08/16/14 by [facility] w/ SOB- felt to be multifactorial w/ components of bronchitic infection superimposed on her mild obstructive/ restrictive lung dis (neg CXR & low prob V/Q scan), diastolic CHF (ML=46-50%, PTW=656)), anemia (Hg=7.6 & Tx 2uPCs), and her pain meds & psychotropics all playing a roll (stress- husb in hosp w/ AFib); she was disch on Pred taper, Levaquin, incr dose of Lasix, & off her prev Lisinopril;  Since her disch she has had f/u DrCooper for Cards & he placed her on a sliding scale regimen for Lasix based on weight (currently on extra Lasix for 2d due to wt gain)...     From the pulm standpoint she is an ex-smoker & has mild obstructive/ mod restrictive lung dis (obesity), hx hypoxemic resp failure on Home O2 at 3L/min continuous, OSA but she is non-compliant w/ CPAP;  Meds: O2 at 3L/min, Advair250Bid, NEBS w/ Xopenex0.63- just using prn & asked to take it Tid, ProairHFA-prn, Mucinex600Bid & asked to incr to Qid; she is on Keflex now for UTI & just finished Pred taper post hosp... Breathing is stabel, in wheelchair, c/o DOE w/ ADLs x yrs and  no real change;  Chest exam is clear w/ distant BS bilat;  Cor w/ Gr1/6 sys murmur no rubs or gallops...    We reviewed prob list, meds, xrays and labs> see below for updates >>   CXR 12/15 showed heart at upper limits, overinflated, no edema or focal airsp dis, NAD...   LABS 12/15 reviewed => Chems- BS up & down (in [place] 549=>190) w/ A1c=6.5, Cr=1.4=>1.1;  CBC- Hg=7.6=>10.6 after 2u...  PLAN>> we decided to continue same meds for her multifactorial dyspnea; she is unable to exercise substantially due to fall risk & I suggested walking pushing her WC; continue regular f/u w/ her other physicians to rx her other factors like diastolicCHF, anemia, narcotics, psychotropics, sedentary lifestyle...   ~  March 29, 2015:  7 month Odin has continued to have problems w/ her anemia- managed by DrGutierrez her PCP and GI- DrDBrodie, notes reviewed; she was [facility] 6/30 - 02/28/15 by [facility] due to her anemia and their DC Summary is reviewed...  From the pulmonary standpoint she has remained stable on Home O2, NEBS w/ Xopenex ("I do it at least once per day"), Advair250Bid, Mucinex- taking 1Bid, ProairHFA (using 1-2 times daily she says);  She remains sedentary & not exercising as requested in the past;  She is an ex-smoker & has mild obstructive/ mod restrictive lung dis (obesity), hx hypoxemic resp failure on Home O2 at 3L/min continuous, OSA but she is non-compliant w/ CPAP...     We reviewed prob list, meds, xrays and labs>>   CXR 02/23/15 showed mild cardiomegaly, stable scarring in the lung bases, NAD...   ~  February 19, 2016:  60moROV & Bethzy is "wore out"; [facility] after [facility]VA 5/24;  She is c/o increased weakness, no stamina, difficulty waking but she denies cough/ sputum/ hemoptysis/ wheezing/ chest tightness/ f/c/s/ etc;  She is in therapy & notes right leg gives way;  CHF w/ mult ER visits & they diuresed 15# fluid she says;  PCP is monitoring blood counts w/ Aranesp  shots;      She has had mult regular visits w/ DrGutierrez, PCP>  Palliative care status, chr diastolic CHF, obesity, watermelon stomach (angiodysplasia), autoimmune hepatitis w/ cirrhosis, anemia, depression, LBP & gait abn;  He is  monitoring her CBCs and Rx w/ Aranesp    She saw DrCooper,CARDS 02/16/16>  HBP, CAD, mild AS, diastolic CHF, morbid obesity, etc; noted to be going downhill, mult somatic complaints- chest discomfort, SOB, weakness; felt to be doing as well as she poss could do given multisystem dis & mult co-morbidities; Rec to continue Demadex40 & Zaroxyln prn wt gain...     She saw DrEllison 01/10/16 for Endocrine>  IDDM, neuropathy, & he endeavors to keep her insulin regimenas simple as poss for her...    She was Hosp by [facility] 3/8 - 11/06/15 and 4/4 - 11/30/15 w/ a COPD exac & improved w/ Rx- disch on O2 at 3L/min, Pred taper, Levaquin, NEBS w/ Xopenes, Advair250, Mucinex, etc;   CXR showed cardiomeg, basilar atelectasis, no pneumonia...    Current Meds:   O2 at 3L/min, NEBS w/ Xopenex Q8H, Advair250Bid, Mucinex600Bid, ProairHFA prn... She remains on the same cardiac regimen w/ Demadex20-2Qam, K20Bid, Imdur30, Zaroxylin2.5 prn wt gain... EXAM shows Afeb, VSS, O2sat=96% on 3L;  Wt=209#, BMI=38;  HEENT- nasal O2, neg, mallampati2;  Chest- decr BS, clear w/o w/r/r;  Heart- RR gr2/6 SEM w/o r/g heard;  Abd- obese, soft, min epig tender;  Ext- w/o c/c/e...  CXR 12/11/15>  Borderline heart size, calcif in Ao, central bronchial wa;; thickening & atelec at bases...  LABS 4-12/2015 in  [facility]>  reviewed IMP/PLAN>>  We discussed her multifactorial dyspnea- COPD, CHF, Obesity, poor conditioning, etc;  She is planning for NHP when [name] is disch to a NH as well;  Same meds for now- she desperately needs PT/OT/ exercise program...           Problem List:  COPD (ICD-496) & BRONCHITIS, RECURRENT (ICD-491.9) - Ex-smoker w/ underlying COPD on home O2, ADVAIR 250Bid, ALBUTEROL (via nebs or MDI for Prn use), &  MUCINEX 1-2Bid... she has combined obstructive AND restrictive disease (based on her obesity) w/ diet (weight reduction) + exercise strongly recommended to the pt...  ~  CTChest 5/06 = neg. ~  baseline CXR w/ bilat LL scarring, NAD.... last CXR 3/09 = unchanged, NAD. ~  PFTs 3/09 showed FVC= 2.47 (83%), FEV1= 1.65 (69%), %1sec= 67, mid-flows= 28%pred. ~  CXR 4/12 showed bilat scarring, NAD... ~  PFT 10/12 showed FVC= 2.28 (77%), FEV1= 1.46 (64%), %1sec= 64, mid-flows= 31% pred. ~  CXR 3/13 showed normal heart size, prom hilar regions, no infiltrates etc, NAD.[name]. ~  CTAngio Chest 3/13 showed neg for PE- scarring at lung bases, no adenopathy or lung lesions, calcified coronaries, multilevel spondylosis in TSpine, cirrhosis in liver ~  CXR 2/14 showed cardiomeg, no pulm edema, clear lungs, NAD ~  PFT 5/14 showed FVC=2.41 (83%), FEV1=1.77 (80%), %1sec=74, mid-flows= 61% predicted (all c/w min obstructive dis, +superimposed restriction) ~  Ambulatory O2 sats 5/14 showed 93% sat on RA at rest w/ HR=60; 87% sat after one lap on RA w/ HR=62 ~  7/14: on Home O2, Advair250, NEBS, Mucinex; remote smoker quit yrs ago; known obstructive & restrictive lung dis; CXR 6/14 showed mild cardiomeg, mild vasc congestion, linear atx at left base, otherw clear; CTA 3/13 was neg for PE & showed some atelec, coronary calcif, multilevel spondylosis, & cirrhosis of the liver; she continues on O2 by [place] at 3L/min...  ~  CXR 7/14 & 9/14 showed norm heart size, clear lungs, no edema etc..  ~  2/15: on Home O2, Advair250, NEBS, Mucinex; remote smoker quit yrs ago; known obstructive & restrictive lung dis; CXR 2/15 showed mild cardiomeg,  mild vasc congestion, left mid lung scarring & peribronch thickening, otherw clear/NAD; CTA 3/13 was neg for PE & showed some atelec, coronary calcif, multilevel spondylosis, & cirrhosis of the liver; she continues on O2 by [place] at 3L/min...  ~  CXR 4/15 & 5/15 by Cards queried a nodular appearance to  right hilum but f/u film showed this was wnl; scarring & atx at bases, healing right 7th rib fx... ~  [facility] 8/15 w/ UTI, fall & fx sternum & right ribs> transferred to NHP/ rehab, then home... ~  CXR 7/28 - 03/28/14 showed mid sternal fx, right lat rib fx, right perihilar opac- ?consolidation, mild interstitial edema, cardiomegaly, prob pulm arterial HTN...  ~  CT Chest 03/25/14 showed sternal fx w/ sm hematoma, acute fx of right lat 9th rib, plus additional old rib fxs, patchy atx bilat, cirrhotic liver & enlarged spleen ~  [facility] 12/15 w/ dyspnea- multifactorial & improved w/ antibiotics, Pred, transfused, etc... ~  CXR 12/15 showed heart at upper limits, overinflated, no edema or focal airsp dis, NAD.[name]. ~  CXR 6/16 showed mild cardiomegaly, stable scarring in the lung bases, NAD   FOLLOWED by DrCooper for CARDIOLOGY >>   HYPERTENSION (ICD-401.9) >> on ATENOLOL 59m/d,  LASIX 490md & K20/d...   CAD (ICD-414.00) >> on Corgard20-1/2, Lisinopril5, Lasix20-2Bid, K10Qod ~  cath 4/09 by DrCooper showed normLMAIN, 20% midLAD, norm CIRC, 20-30% proxRCA, EF= 50-55%... ~  CTAngio Chest 3/13 showed calcif coronaries as noted... ~  DrCooper 7/13> f/u 2DEcho w/ mild LVH, norm LVF w/ EF=55-60% & norm wall motion, mils AS & AI, Gr2DD...Marland KitchenMarland KitchenPALPITATIONS (ICJHE-174.0>>  DIASTOLIC CHF >>  ~  eval by DrKlein w/ 7 beats WCT on monitor... he changed Lisinopril to BBlocker (currently ATENOLOL 5041m) and improved... ~  she saw DrCopper for Cards f/u 1/11- palpit well controlled on Atenolol; hx non-obstructive CAD & good LVF; no change in Rx. ~  [facility] and Lasix [facility]- Disch on Lasix20-3/d  AORTIC STENOSIS (ICD-424.1) - she has mild Ao Valve disease w/ AS/ AI... followed by DrCooper. ~  2DEcho 11/06 showed mild ca++ AoV w/ mild AS, norm LVF w/ EF=55-65% ~  repeat 2DEcho 1/09 showed similar mild ca++ AoV w/ mild AS, mild AI, no regional wall motion abn, norm LVf w/ EF= 60%... NOTE: est PAsys=  35... ~  repeat 2DEcho 1/10 showed norm LVF w/ EF= 55-60%, no regional wall motion abn, mild DD, mod calcif AoV & mild reduced leaflet excursion & mild AI... ~  Repeat 2DEcho 7/13 showed norm LVF w/ EF=55-65%, no regional wall motion abn, Gr2DD, mildAS, mildAI, mild LAdil... ~  DrCooper 7/13> stable, no angina, too sedentary; f/u 2DEcho w/ mild LVH, norm LVF w/ EF=55-60% & norm wall motion, mils AS & AI, Gr2DD... ~  7/14: known non-obstructive CAD; hx atypCP/ CWP; no angina & she remains way too sedentary; discussed risk factor reduction strategy & she saw DrCooper 5/14> stable, no angina, too sedentary; last 2DEcho 7/13 w/ mild LVH, norm LVF w/ EF=55-60% & norm wall motion, mild AS&AI, Gr2DD;  EKG 5/14 w/ NSR, rate62, 1st degree AVB, NSSTTWA... ~  2DEcho 8/15 showed norm LV size & function w/ EF=55-60%, no regional wall motion abn, mildly thickened AV leaflets, mild AS & Ai, calcif of mitral annulus, mod LA dil   FOLLOWED by DrGutierez & DrEllison for PRIMARY CARE >>   HYPERCHOLESTEROLEMIA (ICD-272.0) - on SIMVASTATIN 70m16m..  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  ~  EGD  2/06 showed angiodysplasia & mult gastric pseudopolyps, watermelon stomach improved from 2003. ~  recent f/u EGD 9/09 showed chr gastritis, no watermelon stomach & improved from prev... ~  recurrent anemia 12/10 w/ f/u EGD 2/11 showing gastric antral avm's- s/p argon laser ablation, no varicies etc... 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 ~  5/13:  She is not on PPI and c/o some abd discomfort- rec starting PROTONIX 80m/d... ~  She saw DrDBrodie 10/13 w/ c/o dysphagia + f/u of her autoimmune liver dis, portal HTN, cirrhosis, "watermelon stomach" & hx argon laser ablation of gastric AVMs (chr GI blood loss w/ periodic Fe infusions- last 8/13); EGD 10/13 showed norm esoph mucosa, no varicies, spasm at the LES but no stricture found; dilated to 48F; mod severe portal hypertensive gastropathy in stomach, no active bleeding; REC> continue PPI, try Levsin... ~  1/14:  She had EGD & Argon Laser ablation of gastric AVM by DrDBrodie... 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 ~  9/14:  She was Hosp by [facility] & seen by GI- drPyrtle w/ EGD (no varices, smHH, mild gastropathy, area of GAVE treated w/ APC, nodular mucosa (inflammed mucosa, neg HPylori, no dysplasia)...  DIVERTICULOSIS OF COLON (ICD-562.10) & POLYP, COLON (ICD-211.3) -  ~  last colonoscopy 10/08 w/ adenomatous polyp removed, and angiodysplasia without hemorrhage ~  CT Abd 10/08 showed Cirrhosis, borderline splenomeg, sl pancreatic atrophy & duct dil, sm umbil hernia, DJD sp, NAD... ~  10/13: she had f/u drDBrodie> last colonoscopy was 2008, she felt pt was too high risk for prep & sedation for a f/u colonoscopy & they decided to hold off...  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 ~  LFTs 11/09 = WNL ~  LFTs 3/10 = WNL ~  LFTs 12/10 = WNL ~  LFTs 10/11 showed SGOT=39 (0-37), SGPT=35 (0-35) ~  LFTs 4/12 showed SGOT=39, SGPT=16 ~  LFTs 10/12 = WNL ~  LFTs 5/13 showed AlkPhos=118, SGOT=46, SGPT=44 ~  LFTs 6/14 showed AlkPhos= 151, SGOT=60, SGPT=41  Hx of CYSTITIS, RECURRENT  (ICD-595.9) - hx mult UTI's w/ eval by DrGrapey for Urology... ~  10/11:  presents w/ dysuria & UTI Rx w/ Cipro... ~  12/11:  She had f/u DrGrapey... ~  6/14: she was treated for a sens EColi UTI during 6/14 Hosp w/ Cipro...  OSTEOARTHRITIS (ICD-715.90)  FIBROMYALGIA (ICD-729.1) - she persists w/ mult somatic complaints...  ? of OSTEOPOROSIS (ICD-733.00) - [place] [facility] [place].  RESTLESS LEGS SYNDROME (ICD-333.94) - she prev weaned off the Requip & remains on SINEMET-CR 25-100Bid... ~  9/10: now c/o recurrent restless leg symptoms... restart REQUIP 250m& titrate up. ~  12/10: persistant symptoms on 49m84ms... rec to incr  to 82m... ~  2/11: she reports resolution of RLS on 462mRequip Qhs, resting well now. ~  12/11:  Recurrent RLS symptoms despite the Requip & seen by DrDohmeier w/ addition of Sinemet 25/100 Bid... ~  2012:  Stable on Requip4m26m Sinemet 25/100 Bid... ~  7/13:  She is once again c/o incr RLS symptoms & rec to restart REQUIP 4mg66m..  ~  8/13: she had f/u visit w/ DrDohmeier> RLS, tremors, & insomnia; Seroquel helped for awhile, she is working w/ DrHeImmunologisthelp her problems... ~  7/14: on Requip4Qhs & Sinemet25/100Bid from DrDohmeier (RLS, Trmeor, Gait abn) w/ fair control of symptoms- her note is reviewed & they stopped the Sinemet...  DYSTHYMIA (ICD-300.4) - Hx depression w/ psychotic features [facility] she was prev treated by Dr. Amy Viann [place] for counselling...  Adm to [facility] in May09 by DrBarbSmith w/ depression and meds adjusted (see DC summary)...  Now [facility]Jobe [facility]bon [name] and meds changed to RISPERDAL, PRISTIQUE, BUPROPION, SEROQUEL, XANAX & Nuvigil (Armodafinil)...  husb indicates that she is much better at present & continues outpt Rx... ~  MRI/ MRA Brain 2/08 showed sm vessel dis & atherosclerotic  changes in post circ... ~  9/10: pt indicates that she is off the Trazodone... DrHejazi added Wellbutrin ?for her RLS? ~  2011:  pt continues regular f/u w/ Psychiatry on numerous meds w/ freq med adjustments; she is requested to get [facility] & bring all med bottles to her OVs for review... ~  2012:  We have not received any notes from Psyche; pt has not complied w/ our request for med bottles to be brought to each OV... ~  2013:  We still don't have accurate list of her current meds as we have not received Psyche notes & she NEVER brings meds to office despite our requests... ~  7/14: on RX per Psychiatry DrHejazi (we don't have notes from him) & she does not know her meds, didn't bring med bottles or med list!  As best we can tell she is on Nuvigil (on hold), Pristiq100, WellbutrinXL150, Seroquel100-4Qhs, Xanax1mg 38m- all from Psychiatry & she is reminded to bring all bottles to every visit...   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    Past Surgical History  Procedure Laterality Date  . Hemorroidectomy    . Appendectomy    . Vaginal hysterectomy    . Breast biopsy      bilateral  . Cesarean section      x 3  . Appendectomy    . Esophagogastroduodenoscopy  06/12/2012    Procedure: ESOPHAGOGASTRODUODENOSCOPY (EGD);  Surgeon: Dora Lafayette Dragon  [facility];  Service: Endoscopy;  Laterality: N/A;  . Balloon dilation  06/12/2012    Procedure: BALLOON DILATION;  Surgeon: Dora Lafayette Dragon  Location: [facility];  Service: Endoscopy;  Laterality: N/A;  ?balloon  . Esophagogastroduodenoscopy  09/10/2012    Procedure: ESOPHAGOGASTRODUODENOSCOPY (EGD);  Surgeon: Dora Lafayette Dragon  [facility];  Service: Endoscopy;  Laterality: N/A;  . Hot  hemostasis  09/10/2012    Procedure: HOT HEMOSTASIS (ARGON PLASMA COAGULATION/BICAP);  Surgeon: Dora Lafayette Dragon  [facility];  Service: Endoscopy;  Laterality: N/A;  . Esophagogastroduodenoscopy N/A 01/18/2013    Procedure: ESOPHAGOGASTRODUODENOSCOPY (EGD);  Surgeon: Lafayette Dragon, MD;  Location: [facility] ENDOSCOPY;  Service: Endoscopy;  Laterality: N/A;  . Esophagogastroduodenoscopy N/A 05/24/2013    Procedure: ESOPHAGOGASTRODUODENOSCOPY (EGD);  Surgeon: Jerene Bears, MD;  Location: [facility];  Service: Gastroenterology;  Laterality: N/A;  . Hot hemostasis N/A 05/24/2013    Procedure: HOT HEMOSTASIS (ARGON PLASMA COAGULATION/BICAP);  Surgeon: Jerene Bears, MD;  Location: [facility];  Service: Gastroenterology;  Laterality: N/A;  . US echocardiography  07/2014    mild LVH, EF 60-65%, normal wall motion, mild AR, mod dilated LA, mildly dilated RA, peak PA pressure 35mHg  . Esophagogastroduodenoscopy N/A 10/20/2014    Procedure: ESOPHAGOGASTRODUODENOSCOPY (EGD);  Surgeon: DLafayette Dragon MD;  [facility];  Service: Endoscopy;  Laterality: N/A;  . Hot hemostasis N/A 10/20/2014    Procedure: HOT HEMOSTASIS (ARGON PLASMA COAGULATION/BICAP);  Surgeon: DLafayette Dragon MD;  [facility];  Service: Endoscopy;  Laterality: N/A;  . Enteroscopy N/A 02/25/2015    Procedure: ENTEROSCOPY;  Surgeon: PCarol Ada MD;  [place] [facility]  Service: Endoscopy;  Laterality: N/A;  . Enteroscopy N/A 06/27/2015    Procedure: ENTEROSCOPY;  Surgeon: SManus Gunning MD;  [facility];  Service: Gastroenterology;  Laterality: N/A;  . Eus  09/2015    antral CAVL, ablated; nodular erosive gastritis; mult pancreatic cysts rec rpt CT pancreas protocol or MRI to survey pancreas cysts 1-2 yrs (Dr CAmie Critchley    Outpatient Encounter Prescriptions as of 02/19/2016  Medication Sig  . ADVAIR DISKUS 250-50 MCG/DOSE AEPB INHALE 1 PUFF INTO LUNGS TWICE A DAY  . albuterol (PROAIR HFA)  108 (90 BASE) MCG/ACT inhaler Inhale 2 puffs into the lungs every 6 (six) hours as needed for wheezing or shortness of breath. Reported on 08/18/2015  . ALPRAZolam (XANAX) 1 MG tablet TAKE 1/2-1 TAB BY MOUTH 2 TIMES DAILY AS NEEDED FOR ANXIETY OR SLEEP  . B Complex-C ([facility] B COMPLEX PLUS C) TABS TAKE ONE CAPSULE BY MOUTH EVERY DAY  . buPROPion (WELLBUTRIN XL) 150 MG 24 hr tablet Take 150 mg by mouth daily.  .Marland Kitchendesvenlafaxine (PRISTIQ) 50 MG 24 hr tablet Take 3 tablets (150 mg total) by mouth daily.  . diclofenac sodium (VOLTAREN) 1 % GEL Apply 1 application topically 3 (three) times daily.  . ferrous sulfate 325 (65 FE) MG tablet TAKE 1 TABLET BY MOUTH EVERY DAY WITH BREAKFAST  . glucose blood (ONE TOUCH ULTRA TEST) test strip Check blood sugar 4 times daily  . guaiFENesin (MUCINEX) 600 MG 12 hr tablet Take 600 mg by mouth 2 (two) times daily.   .Marland KitchenguaiFENesin-codeine 100-10 MG/5ML syrup Take 5 mLs by mouth 2 (two) times daily as needed. cough  . Insulin Glargine (LANTUS SOLOSTAR) 100 UNIT/ML Solostar Pen Inject 120 Units into the skin every morning.  . insulin lispro (HUMALOG KWIKPEN) 100 UNIT/ML KiwkPen Inject 5 Units into the skin 3 (three) times daily with meals. Take only if blood sugar is over 300  . Insulin Pen Needle (B-D ULTRAFINE III SHORT PEN) 31G X 8 MM MISC Use as directed to inject insulin twice daily. Dx: E11.8  . isosorbide mononitrate (IMDUR) 30 MG 24 hr tablet Take 0.5 tablets (15 mg total) by mouth daily.  .Marland Kitchenlactulose, encephalopathy, (CHRONULAC) 10 GM/15ML SOLN Take 20 g by mouth 3 (three) times daily.   .Marland Kitchenlevalbuterol (XOPENEX) 0.63 MG/3ML nebulizer solution Take 0.63 mg by nebulization every 8 (eight)  hours as needed for wheezing or shortness of breath.   . loratadine (CLARITIN) 10 MG tablet TAKE 1 TABLET BY MOUTH EVERY DAY  . metolazone (ZAROXOLYN) 2.5 MG tablet TAKE AS DIRECTED FOR 4 LB WEIGHT GAIN  . Multiple Vitamins-Minerals (CENTRUM SILVER PO) Take 1 tablet by mouth daily.    . mupirocin cream (BACTROBAN) 2 % Apply 1 application topically 2 (two) times daily. Reported on 11/14/2015  . nitroGLYCERIN (NITROSTAT) 0.4 MG SL tablet Place 1 tablet (0.4 mg total) under the tongue every 5 (five) minutes as needed for chest pain. No more than 3 in 15 minutes  . ONETOUCH DELICA LANCETS 81E MISC USE TO CHECK SUGAR 4 TIMES DAILY AND AS NEEDED. XN:T70.01 **ONE TOUCH DELICA**  . oxyCODONE (OXY IR/ROXICODONE) 5 MG immediate release tablet Take 1 tablet (5 mg total) by mouth every 6 (six) hours as needed for moderate pain or severe pain.  [name] oxycodone (OXY-IR) 5 MG capsule Take 5 mg by mouth every 6 (six) hours as needed. Pain  . pantoprazole (PROTONIX) 40 MG tablet TAKE 1 TABLET BY MOUTH TWICE A DAY  . potassium chloride SA (K-DUR,KLOR-CON) 20 MEQ tablet Take 40 mEq by mouth 2 (two) times daily.   . promethazine (PHENERGAN) 25 MG tablet Take 25 mg by mouth 2 (two) times daily as needed for nausea or vomiting.  [name] QUEtiapine (SEROQUEL) 200 MG tablet TAKE 1 TABLET BY MOUTH AT BEDTIME  . rifaximin (XIFAXAN) 550 MG TABS tablet Take 550 mg by mouth 2 (two) times daily. Reported on 10/17/2015  . rOPINIRole (REQUIP) 4 MG tablet TAKE 1 TABLET BY MOUTH AT BEDTIME  . simvastatin (ZOCOR) 40 MG tablet Take 40 mg by mouth daily.  . sodium chloride (V-R NASAL SPRAY SALINE) 0.65 % nasal spray Place 1 spray into both nostrils as needed.  . torsemide (DEMADEX) 20 MG tablet Take 40 mg by mouth daily.  . Vitamin D, Ergocalciferol, (DRISDOL) 50000 units CAPS capsule Take 50,000 Units by mouth once a week. On Fridays  . [DISCONTINUED] albuterol (PROAIR HFA) 108 (90 Base) MCG/ACT inhaler Inhale 2 puffs into the lungs every 6 (six) hours as needed.   No facility-administered encounter medications on file as of 02/19/2016.    Allergies  Allergen Reactions  . Acetaminophen Other (See Comments)    Liver dysfunction  . Nsaids Other (See Comments)    Liver dysfunction   . Aspirin Other (See Comments)     Causes nosebleeds  . Theophylline Nausea And Vomiting  . Penicillin G Itching  . Penicillins Itching    Has patient had a PCN reaction causing immediate rash, facial/tongue/throat swelling, SOB or lightheadedness with hypotension: unknown Has patient had a PCN reaction causing severe rash involving mucus membranes or skin necrosis: unknown Has patient had a PCN reaction that required hospitalization unknown Has patient had a PCN reaction occurring within the last 10 years: yes If all of the above answers are "NO", then may proceed with Cephalosporin use.     Current Medications, Allergies, Past Medical History, Past Surgical History, Family History, and Social History were reviewed in [employer] record.    Review of Systems         See HPI - all other systems neg except as noted...      The patient complains of weight gain, dyspnea on exertion, muscle weakness, difficulty walking, and depression.  The patient denies anorexia, fever, weight loss, vision loss, decreased hearing, hoarseness, chest pain, syncope, peripheral edema, prolonged cough, headaches,  hemoptysis, abdominal pain, melena, hematochezia, severe indigestion/heartburn, hematuria, incontinence, suspicious skin lesions, transient blindness, unusual weight change, abnormal bleeding, enlarged lymph nodes, and angioedema.     Objective:   Physical Exam     WD, Obese, 75 y/o WF in NAD... she is chr ill appearing... GENERAL:  Alert & oriented x 3... HEENT:  [place]/AT, pale conjuct, EOM-wnl, PERRLA, EACs-clear, TMs-wnl, NOSE-clear, THROAT-clear w/ dry MMs... NECK:  Supple w/ fairROM; no JVD; normal carotid impulses w/o bruits; no thyromegaly or nodules palpated; no lymphadenopathy. CHEST:  Clear to P & A; decr BS bilat without wheezes/ rales/ or rhonchi heard... HEART:  Regular Rhythm;  gr 1-2/6 SEM without rubs or gallops... ABDOMEN:  Obese, soft & nontender w/ panniculus; normal bowel sounds; no organomegaly  or masses detected. EXT: without deformities, mod arthritic changes; no varicose veins/ +venous insuffic/ tr edema. NEURO:  CN's intact;  no focal neuro deficits... DERM:  No lesions noted; no rash, pale complexion.  RADIOLOGY DATA:  Reviewed in the EPIC EMR & discussed w/ the patient...  LABORATORY DATA:  Reviewed in the EPIC EMR & discussed w/ the patient...   Assessment & Plan:    DYSPNEA> clearly this is multifactorial w/ components from Lung dis, CHF, Obesity, inactivity, Anemia, etc; a large component is likely related to anxiety & chest "tightness" for which she has Xanax 64m from her psychiatrists...  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  HBP>  Controlled on low dose BBlocker plus diuretic> continue same & work on weight reduction...  CAD, DiastolicCHF>  Known nonobstructive dis, way too sedentary> we discussed diet + exercise...  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  CHOL>  Stable on the Simva40...  DM>  Managed by DrEllison on Lantus insulin currently...  OBESITY>  Reviewed diet + exercise needed...  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  Autoimmune hepatitis>  Also managed by DrBrodie, off the Immuran at present... She also has pancytopenia related to her cirrhosis.  RLS>  Notes from DrDohmeier reviewed> pt is off Sinemet now...  Dysthymia>  followed by Psyche on mult meds...  Anemia>  [facility] DrDBrodie for GI, DrGutierrez for Primary..   Patient's Medications  New Prescriptions   No medications on file  Previous Medications   ADVAIR DISKUS  250-50 MCG/DOSE AEPB    INHALE 1 PUFF INTO LUNGS TWICE A DAY   ALBUTEROL (PROAIR HFA) 108 (90 BASE) MCG/ACT INHALER    Inhale 2 puffs into the lungs every 6 (six) hours as needed for wheezing or shortness of breath. Reported on 08/18/2015   ALPRAZOLAM (XANAX) 1 MG TABLET    TAKE 1/2-1 TAB BY MOUTH 2 TIMES DAILY AS NEEDED FOR ANXIETY OR SLEEP   B COMPLEX-C ([facility] B COMPLEX PLUS C) TABS    TAKE ONE CAPSULE BY MOUTH EVERY DAY   BUPROPION (WELLBUTRIN XL) 150 MG 24 HR TABLET    Take 150 mg by mouth daily.   DESVENLAFAXINE (PRISTIQ) 50 MG 24 HR TABLET    Take 3 tablets (150 mg total) by mouth daily.   DICLOFENAC SODIUM (VOLTAREN) 1 % GEL    Apply  1 application topically 3 (three) times daily.   FERROUS SULFATE 325 (65 FE) MG TABLET    TAKE 1 TABLET BY MOUTH EVERY DAY WITH BREAKFAST   GLUCOSE BLOOD (ONE TOUCH ULTRA TEST) TEST STRIP    Check blood sugar 4 times daily   GUAIFENESIN (MUCINEX) 600 MG 12 HR TABLET    Take 600 mg by mouth 2 (two) times daily.    GUAIFENESIN-CODEINE 100-10 MG/5ML SYRUP    Take 5 mLs by mouth 2 (two) times daily as needed. cough   INSULIN GLARGINE (LANTUS SOLOSTAR) 100 UNIT/ML SOLOSTAR PEN    Inject 120 Units into the skin every morning.   INSULIN LISPRO (HUMALOG KWIKPEN) 100 UNIT/ML KIWKPEN    Inject 5 Units into the skin 3 (three) times daily with meals. Take only if blood sugar is over 300   INSULIN PEN NEEDLE (B-D ULTRAFINE III SHORT PEN) 31G X 8 MM MISC    Use as directed to inject insulin twice daily. Dx: E11.8   ISOSORBIDE MONONITRATE (IMDUR) 30 MG 24 HR TABLET    Take 0.5 tablets (15 mg total) by mouth daily.   LACTULOSE, ENCEPHALOPATHY, (CHRONULAC) 10 GM/15ML SOLN    Take 20 g by mouth 3 (three) times daily.    LEVALBUTEROL (XOPENEX) 0.63 MG/3ML NEBULIZER SOLUTION    Take 0.63 mg by nebulization every 8 (eight) hours as needed for wheezing or shortness of breath.    LORATADINE (CLARITIN) 10 MG TABLET    TAKE 1 TABLET BY MOUTH EVERY DAY   METOLAZONE (ZAROXOLYN) 2.5 MG  TABLET    TAKE AS DIRECTED FOR 4 LB WEIGHT GAIN   MULTIPLE VITAMINS-MINERALS (CENTRUM SILVER PO)    Take 1 tablet by mouth daily.    MUPIROCIN CREAM (BACTROBAN) 2 %    Apply 1 application topically 2 (two) times daily. Reported on 11/14/2015   NITROGLYCERIN (NITROSTAT) 0.4 MG SL TABLET    Place 1 tablet (0.4 mg total) under the tongue every 5 (five) minutes as needed for chest pain. No more than 3 in 15 minutes   ONETOUCH DELICA LANCETS 60F MISC    USE TO CHECK SUGAR 4 TIMES DAILY AND AS NEEDED. UX:N23.55 **ONE TOUCH DELICA**   OXYCODONE (OXY IR/ROXICODONE) 5 MG IMMEDIATE RELEASE TABLET    Take 1 tablet (5 mg total) by mouth every 6 (six) hours as needed for moderate pain or severe pain.   OXYCODONE (OXY-IR) 5 MG CAPSULE    Take 5 mg by mouth every 6 (six) hours as needed. Pain   PANTOPRAZOLE (PROTONIX) 40 MG TABLET    TAKE 1 TABLET BY MOUTH TWICE A DAY   POTASSIUM CHLORIDE SA (K-DUR,KLOR-CON) 20 MEQ TABLET    Take 40 mEq by mouth 2 (two) times daily.    PROMETHAZINE (PHENERGAN) 25 MG TABLET    Take 25 mg by mouth 2 (two) times daily as needed for nausea or vomiting.   QUETIAPINE (SEROQUEL) 200 MG TABLET    TAKE 1 TABLET BY MOUTH AT BEDTIME   RIFAXIMIN (XIFAXAN) 550 MG TABS TABLET    Take 550 mg by mouth 2 (two) times daily. Reported on 10/17/2015   ROPINIROLE (REQUIP) 4 MG TABLET    TAKE 1 TABLET BY MOUTH AT BEDTIME   SIMVASTATIN (ZOCOR) 40 MG TABLET    Take 40 mg by mouth daily.   SODIUM CHLORIDE (V-R NASAL SPRAY SALINE) 0.65 % NASAL SPRAY    Place 1 spray into both nostrils as needed.   TORSEMIDE (DEMADEX) 20 MG TABLET  Take 40 mg by mouth daily.   VITAMIN D, ERGOCALCIFEROL, (DRISDOL) 50000 UNITS CAPS CAPSULE    Take 50,000 Units by mouth once a week. On Fridays  Modified Medications   No medications on file  Discontinued Medications   ALBUTEROL (PROAIR HFA) 108 (90 BASE) MCG/ACT INHALER    Inhale 2 puffs into the lungs every 6 (six) hours as needed.

## 2016-02-20 ENCOUNTER — Telehealth: Payer: Self-pay

## 2016-02-20 DIAGNOSIS — M797 Fibromyalgia: Secondary | ICD-10-CM | POA: Diagnosis not present

## 2016-02-20 DIAGNOSIS — K7581 Nonalcoholic steatohepatitis (NASH): Secondary | ICD-10-CM | POA: Diagnosis not present

## 2016-02-20 DIAGNOSIS — K7469 Other cirrhosis of liver: Secondary | ICD-10-CM | POA: Diagnosis not present

## 2016-02-20 DIAGNOSIS — I130 Hypertensive heart and chronic kidney disease with heart failure and stage 1 through stage 4 chronic kidney disease, or unspecified chronic kidney disease: Secondary | ICD-10-CM | POA: Diagnosis not present

## 2016-02-20 DIAGNOSIS — D61818 Other pancytopenia: Secondary | ICD-10-CM | POA: Diagnosis not present

## 2016-02-20 DIAGNOSIS — E1122 Type 2 diabetes mellitus with diabetic chronic kidney disease: Secondary | ICD-10-CM | POA: Diagnosis not present

## 2016-02-20 DIAGNOSIS — I5032 Chronic diastolic (congestive) heart failure: Secondary | ICD-10-CM | POA: Diagnosis not present

## 2016-02-20 DIAGNOSIS — M1991 Primary osteoarthritis, unspecified site: Secondary | ICD-10-CM | POA: Diagnosis not present

## 2016-02-20 DIAGNOSIS — E1142 Type 2 diabetes mellitus with diabetic polyneuropathy: Secondary | ICD-10-CM | POA: Diagnosis not present

## 2016-02-20 DIAGNOSIS — J449 Chronic obstructive pulmonary disease, unspecified: Secondary | ICD-10-CM | POA: Diagnosis not present

## 2016-02-20 DIAGNOSIS — J9611 Chronic respiratory failure with hypoxia: Secondary | ICD-10-CM | POA: Diagnosis not present

## 2016-02-20 DIAGNOSIS — I2510 Atherosclerotic heart disease of native coronary artery without angina pectoris: Secondary | ICD-10-CM | POA: Diagnosis not present

## 2016-02-20 DIAGNOSIS — N183 Chronic kidney disease, stage 3 (moderate): Secondary | ICD-10-CM | POA: Diagnosis not present

## 2016-02-20 NOTE — Telephone Encounter (Signed)
I recommend transfusion if Hgb <8  Currently dont' think she needs transfusion.

## 2016-02-20 NOTE — Telephone Encounter (Signed)
Pt left v/m; pt was last seen 01/26/2016; pt was talking with [facility] nurse; after last shot, pts hgb is low;hgb dropped to 8.3. pt is very tired and pt does not have any energy.pt wants to know if Dr Darnell Level thinks pt needs to get blood transfusion. Pt request cb.

## 2016-02-21 DIAGNOSIS — K7469 Other cirrhosis of liver: Secondary | ICD-10-CM | POA: Diagnosis not present

## 2016-02-21 DIAGNOSIS — E1122 Type 2 diabetes mellitus with diabetic chronic kidney disease: Secondary | ICD-10-CM | POA: Diagnosis not present

## 2016-02-21 DIAGNOSIS — M1991 Primary osteoarthritis, unspecified site: Secondary | ICD-10-CM | POA: Diagnosis not present

## 2016-02-21 DIAGNOSIS — M797 Fibromyalgia: Secondary | ICD-10-CM | POA: Diagnosis not present

## 2016-02-21 DIAGNOSIS — E1142 Type 2 diabetes mellitus with diabetic polyneuropathy: Secondary | ICD-10-CM | POA: Diagnosis not present

## 2016-02-21 DIAGNOSIS — N183 Chronic kidney disease, stage 3 (moderate): Secondary | ICD-10-CM | POA: Diagnosis not present

## 2016-02-21 DIAGNOSIS — I5032 Chronic diastolic (congestive) heart failure: Secondary | ICD-10-CM | POA: Diagnosis not present

## 2016-02-21 DIAGNOSIS — I2510 Atherosclerotic heart disease of native coronary artery without angina pectoris: Secondary | ICD-10-CM | POA: Diagnosis not present

## 2016-02-21 DIAGNOSIS — K7581 Nonalcoholic steatohepatitis (NASH): Secondary | ICD-10-CM | POA: Diagnosis not present

## 2016-02-21 DIAGNOSIS — D61818 Other pancytopenia: Secondary | ICD-10-CM | POA: Diagnosis not present

## 2016-02-21 DIAGNOSIS — J449 Chronic obstructive pulmonary disease, unspecified: Secondary | ICD-10-CM | POA: Diagnosis not present

## 2016-02-21 DIAGNOSIS — I130 Hypertensive heart and chronic kidney disease with heart failure and stage 1 through stage 4 chronic kidney disease, or unspecified chronic kidney disease: Secondary | ICD-10-CM | POA: Diagnosis not present

## 2016-02-21 DIAGNOSIS — J9611 Chronic respiratory failure with hypoxia: Secondary | ICD-10-CM | POA: Diagnosis not present

## 2016-02-21 NOTE — Telephone Encounter (Signed)
Pt notified of Dr. Synthia Innocent comments and verbalized understanding

## 2016-02-22 ENCOUNTER — Encounter: Payer: Self-pay | Admitting: Endocrinology

## 2016-02-22 DIAGNOSIS — E119 Type 2 diabetes mellitus without complications: Secondary | ICD-10-CM | POA: Diagnosis not present

## 2016-02-22 DIAGNOSIS — D61818 Other pancytopenia: Secondary | ICD-10-CM | POA: Diagnosis not present

## 2016-02-22 DIAGNOSIS — H25043 Posterior subcapsular polar age-related cataract, bilateral: Secondary | ICD-10-CM | POA: Diagnosis not present

## 2016-02-22 DIAGNOSIS — M1991 Primary osteoarthritis, unspecified site: Secondary | ICD-10-CM | POA: Diagnosis not present

## 2016-02-22 DIAGNOSIS — I5032 Chronic diastolic (congestive) heart failure: Secondary | ICD-10-CM | POA: Diagnosis not present

## 2016-02-22 DIAGNOSIS — I130 Hypertensive heart and chronic kidney disease with heart failure and stage 1 through stage 4 chronic kidney disease, or unspecified chronic kidney disease: Secondary | ICD-10-CM | POA: Diagnosis not present

## 2016-02-22 DIAGNOSIS — K7469 Other cirrhosis of liver: Secondary | ICD-10-CM | POA: Diagnosis not present

## 2016-02-22 DIAGNOSIS — E1122 Type 2 diabetes mellitus with diabetic chronic kidney disease: Secondary | ICD-10-CM | POA: Diagnosis not present

## 2016-02-22 DIAGNOSIS — J9611 Chronic respiratory failure with hypoxia: Secondary | ICD-10-CM | POA: Diagnosis not present

## 2016-02-22 DIAGNOSIS — I2510 Atherosclerotic heart disease of native coronary artery without angina pectoris: Secondary | ICD-10-CM | POA: Diagnosis not present

## 2016-02-22 DIAGNOSIS — E1142 Type 2 diabetes mellitus with diabetic polyneuropathy: Secondary | ICD-10-CM | POA: Diagnosis not present

## 2016-02-22 DIAGNOSIS — K7581 Nonalcoholic steatohepatitis (NASH): Secondary | ICD-10-CM | POA: Diagnosis not present

## 2016-02-22 DIAGNOSIS — M797 Fibromyalgia: Secondary | ICD-10-CM | POA: Diagnosis not present

## 2016-02-22 DIAGNOSIS — H04123 Dry eye syndrome of bilateral lacrimal glands: Secondary | ICD-10-CM | POA: Diagnosis not present

## 2016-02-22 DIAGNOSIS — N183 Chronic kidney disease, stage 3 (moderate): Secondary | ICD-10-CM | POA: Diagnosis not present

## 2016-02-22 DIAGNOSIS — J449 Chronic obstructive pulmonary disease, unspecified: Secondary | ICD-10-CM | POA: Diagnosis not present

## 2016-02-22 LAB — HM DIABETES EYE EXAM

## 2016-02-26 ENCOUNTER — Ambulatory Visit (INDEPENDENT_AMBULATORY_CARE_PROVIDER_SITE_OTHER): Payer: Medicare Other | Admitting: Internal Medicine

## 2016-02-26 ENCOUNTER — Encounter: Payer: Self-pay | Admitting: Internal Medicine

## 2016-02-26 VITALS — BP 130/82 | HR 68 | Temp 98.4°F | Wt 206.0 lb

## 2016-02-26 DIAGNOSIS — J449 Chronic obstructive pulmonary disease, unspecified: Secondary | ICD-10-CM | POA: Diagnosis not present

## 2016-02-26 DIAGNOSIS — I2510 Atherosclerotic heart disease of native coronary artery without angina pectoris: Secondary | ICD-10-CM | POA: Diagnosis not present

## 2016-02-26 DIAGNOSIS — M1991 Primary osteoarthritis, unspecified site: Secondary | ICD-10-CM | POA: Diagnosis not present

## 2016-02-26 DIAGNOSIS — N3090 Cystitis, unspecified without hematuria: Secondary | ICD-10-CM

## 2016-02-26 DIAGNOSIS — K7469 Other cirrhosis of liver: Secondary | ICD-10-CM | POA: Diagnosis not present

## 2016-02-26 DIAGNOSIS — E1122 Type 2 diabetes mellitus with diabetic chronic kidney disease: Secondary | ICD-10-CM | POA: Diagnosis not present

## 2016-02-26 DIAGNOSIS — N3080 Other cystitis without hematuria: Secondary | ICD-10-CM

## 2016-02-26 DIAGNOSIS — M797 Fibromyalgia: Secondary | ICD-10-CM | POA: Diagnosis not present

## 2016-02-26 DIAGNOSIS — D61818 Other pancytopenia: Secondary | ICD-10-CM | POA: Diagnosis not present

## 2016-02-26 DIAGNOSIS — E1142 Type 2 diabetes mellitus with diabetic polyneuropathy: Secondary | ICD-10-CM | POA: Diagnosis not present

## 2016-02-26 DIAGNOSIS — I5032 Chronic diastolic (congestive) heart failure: Secondary | ICD-10-CM | POA: Diagnosis not present

## 2016-02-26 DIAGNOSIS — J9611 Chronic respiratory failure with hypoxia: Secondary | ICD-10-CM | POA: Diagnosis not present

## 2016-02-26 DIAGNOSIS — N183 Chronic kidney disease, stage 3 (moderate): Secondary | ICD-10-CM | POA: Diagnosis not present

## 2016-02-26 DIAGNOSIS — I130 Hypertensive heart and chronic kidney disease with heart failure and stage 1 through stage 4 chronic kidney disease, or unspecified chronic kidney disease: Secondary | ICD-10-CM | POA: Diagnosis not present

## 2016-02-26 DIAGNOSIS — K7581 Nonalcoholic steatohepatitis (NASH): Secondary | ICD-10-CM | POA: Diagnosis not present

## 2016-02-26 LAB — POC URINALSYSI DIPSTICK (AUTOMATED)
Bilirubin, UA: NEGATIVE
Blood, UA: NEGATIVE
GLUCOSE UA: NEGATIVE
Ketones, UA: NEGATIVE
NITRITE UA: NEGATIVE
Protein, UA: NEGATIVE
Spec Grav, UA: 1.02
UROBILINOGEN UA: 4
pH, UA: 6.5

## 2016-02-26 MED ORDER — SULFAMETHOXAZOLE-TRIMETHOPRIM 800-160 MG PO TABS: 1.0000 | ORAL_TABLET | Freq: Two times a day (BID) | ORAL | Status: DC

## 2016-02-26 NOTE — Progress Notes (Signed)
Subjective:    Patient ID: [name], female    DOB: 10/27/1940, 75 y.o.   MRN: [id number]  HPI Here with daughter [name] due to back ache  In the kidney area and sometimes up to "the bottom of my lungs" Nauseated Low grade fever starts daily ~1PM--better after lying down with cold compress on her head Thinks this could be kidney infection---had severe 1 several days ago Some of this goes back 2-3 months  Trouble with pain if lying down or getting up  No burning dysuria "but it feels hot in that area" Vaginal has pain "there" Hard to judge urgency due to diuretics Some incontinence  Current Outpatient Prescriptions on File Prior to Visit  Medication Sig Dispense Refill  . ADVAIR DISKUS 250-50 MCG/DOSE AEPB INHALE 1 PUFF INTO LUNGS TWICE A DAY 60 each 11  . albuterol (PROAIR HFA) 108 (90 BASE) MCG/ACT inhaler Inhale 2 puffs into the lungs every 6 (six) hours as needed for wheezing or shortness of breath. Reported on 08/18/2015    . ALPRAZolam (XANAX) 1 MG tablet TAKE 1/2-1 TAB BY MOUTH 2 TIMES DAILY AS NEEDED FOR ANXIETY OR SLEEP 60 tablet 0  . B Complex-C ([facility] B COMPLEX PLUS C) TABS TAKE ONE CAPSULE BY MOUTH EVERY DAY 30 tablet 6  . buPROPion (WELLBUTRIN XL) 150 MG 24 hr tablet Take 150 mg by mouth daily.    [name] desvenlafaxine (PRISTIQ) 50 MG 24 hr tablet Take 3 tablets (150 mg total) by mouth daily. 90 tablet 11  . diclofenac sodium (VOLTAREN) 1 % GEL Apply 1 application topically 3 (three) times daily. 1 Tube 1  . ferrous sulfate 325 (65 FE) MG tablet TAKE 1 TABLET BY MOUTH EVERY DAY WITH BREAKFAST 90 tablet 1  . glucose blood (ONE TOUCH ULTRA TEST) test strip Check blood sugar 4 times daily 200 each 3  . guaiFENesin (MUCINEX) 600 MG 12 hr tablet Take 600 mg by mouth 2 (two) times daily.     [name] guaiFENesin-codeine 100-10 MG/5ML syrup Take 5 mLs by mouth 2 (two) times daily as needed. cough  0  . Insulin Glargine (LANTUS SOLOSTAR) 100 UNIT/ML Solostar Pen Inject 120 Units into  the skin every morning. 20 pen 11  . insulin lispro (HUMALOG KWIKPEN) 100 UNIT/ML KiwkPen Inject 5 Units into the skin 3 (three) times daily with meals. Take only if blood sugar is over 300    . Insulin Pen Needle (B-D ULTRAFINE III SHORT PEN) 31G X 8 MM MISC Use as directed to inject insulin twice daily. Dx: E11.8 200 each 2  . isosorbide mononitrate (IMDUR) 30 MG 24 hr tablet Take 0.5 tablets (15 mg total) by mouth daily. 30 tablet 6  . lactulose, encephalopathy, (CHRONULAC) 10 GM/15ML SOLN Take 20 g by mouth 3 (three) times daily.     [name] levalbuterol (XOPENEX) 0.63 MG/3ML nebulizer solution Take 0.63 mg by nebulization every 8 (eight) hours as needed for wheezing or shortness of breath.   5  . loratadine (CLARITIN) 10 MG tablet TAKE 1 TABLET BY MOUTH EVERY DAY 30 tablet 11  . metolazone (ZAROXOLYN) 2.5 MG tablet TAKE AS DIRECTED FOR 4 LB WEIGHT GAIN 15 tablet 11  . Multiple Vitamins-Minerals (CENTRUM SILVER PO) Take 1 tablet by mouth daily.     . mupirocin cream (BACTROBAN) 2 % Apply 1 application topically 2 (two) times daily. Reported on 11/14/2015    . nitroGLYCERIN (NITROSTAT) 0.4 MG SL tablet Place 1 tablet (0.4 mg total) under the  tongue every 5 (five) minutes as needed for chest pain. No more than 3 in 15 minutes 25 tablet 1  . ONETOUCH DELICA LANCETS 99991111 MISC USE TO CHECK SUGAR 4 TIMES DAILY AND AS NEEDED. 123456 **ONE TOUCH DELICA** A999333 each 1  . oxyCODONE (OXY IR/ROXICODONE) 5 MG immediate release tablet Take 1 tablet (5 mg total) by mouth every 6 (six) hours as needed for moderate pain or severe pain. 60 tablet 0  . oxycodone (OXY-IR) 5 MG capsule Take 5 mg by mouth every 6 (six) hours as needed. Pain  0  . pantoprazole (PROTONIX) 40 MG tablet TAKE 1 TABLET BY MOUTH TWICE A DAY 60 tablet 11  . potassium chloride SA (K-DUR,KLOR-CON) 20 MEQ tablet Take 40 mEq by mouth 2 (two) times daily.     . promethazine (PHENERGAN) 25 MG tablet Take 25 mg by mouth 2 (two) times daily as needed for  nausea or vomiting.    [name] QUEtiapine (SEROQUEL) 200 MG tablet TAKE 1 TABLET BY MOUTH AT BEDTIME 30 tablet 3  . rifaximin (XIFAXAN) 550 MG TABS tablet Take 550 mg by mouth 2 (two) times daily. Reported on 10/17/2015    . rOPINIRole (REQUIP) 4 MG tablet TAKE 1 TABLET BY MOUTH AT BEDTIME 30 tablet 6  . simvastatin (ZOCOR) 40 MG tablet Take 40 mg by mouth daily.    . sodium chloride (V-R NASAL SPRAY SALINE) 0.65 % nasal spray Place 1 spray into both nostrils as needed.    . torsemide (DEMADEX) 20 MG tablet Take 40 mg by mouth daily.    . Vitamin D, Ergocalciferol, (DRISDOL) 50000 units CAPS capsule Take 50,000 Units by mouth once a week. On Fridays  6   No current facility-administered medications on file prior to visit.    Allergies  Allergen Reactions  . Acetaminophen Other (See Comments)    Liver dysfunction  . Nsaids Other (See Comments)    Liver dysfunction   . Aspirin Other (See Comments)    Causes nosebleeds  . Theophylline Nausea And Vomiting  . Penicillin G Itching  . Penicillins Itching    Has patient had a PCN reaction causing immediate rash, facial/tongue/throat swelling, SOB or lightheadedness with hypotension: unknown Has patient had a PCN reaction causing severe rash involving mucus membranes or skin necrosis: unknown Has patient had a PCN reaction that required hospitalization unknown Has patient had a PCN reaction occurring within the last 10 years: yes If all of the above answers are "NO", then may proceed with Cephalosporin use.     Past Medical History  Diagnosis Date  . Chronic airway obstruction, not elsewhere classified   . Unspecified chronic bronchitis ([place])   . Unspecified essential hypertension   . Non-obstructive CAD   . Palpitations   . Mild aortic stenosis     a. 03/2014 Valve area (VTI): 2.89 cm^2, Valve area (Vmax): 2.7 cm^2.  . Pure hypercholesterolemia   . Morbid obesity ([place])   . Esophageal reflux   . GAVE (gastric antral vascular ectasia)      angiodysplasia; s/p multiple APCs, discussing RFA with [facility] Dr Newman Pies (06/2015)  . Diverticulosis of colon (without mention of hemorrhage)   . Benign neoplasm of colon   . Osteoarthrosis, unspecified whether generalized or localized, unspecified site   . Osteoporosis, unspecified   . Restless legs syndrome (RLS)   . Major depressive disorder, recurrent episode, severe, specified as with psychotic behavior   . Iron deficiency anemia secondary to blood loss (chronic)  a. frequent PRBC transfusions.  . Coarse tremors     a. arms.  . Carpal tunnel syndrome on both sides   . Chronic diastolic CHF (congestive heart failure) ([place])     a. 03/26/2014 Echo: EF 55-60%, no rwma, mild AS/AI, mod-sev Ca2+ MV annulus, mildly to mod dil LA.  [name] Asthma   . Type II diabetes mellitus ([place])   . H/O hiatal hernia   . 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 dx'd 1990's  . Adenomatous colon polyp   . Midsternal chest pain     a. conservatively managed ->poor candidate for cath/anticoagulation.  [name] Anxiety   . Portal hypertensive gastropathy   . Falls frequently     completed HHPT/OT 06/2014, PT unmet goals  . Recurrent UTI (urinary tract infection)     h/o hospitalization with urosepsis 2015, but thought large component colonization/bacteriuria, only treat if symptomatic ([place])  . Hiatal hernia   . Thrombocytopenia ([place])   . Protein calorie malnutrition ([place])   . On home oxygen therapy     "3L; 24/7" (11/29/2015)  . OSA (obstructive sleep apnea)     a. does not use CPAP. (01/25/2015)  . Hepatitis   . CAP (community acquired pneumonia) 2015    Past Surgical History  Procedure Laterality Date  . Hemorroidectomy    . Appendectomy    . Vaginal hysterectomy    . Breast biopsy      bilateral  . Cesarean section      x 3  . Appendectomy    . Esophagogastroduodenoscopy  06/12/2012    Procedure: ESOPHAGOGASTRODUODENOSCOPY (EGD);  Surgeon: Lafayette Dragon, MD;   Location: [facility];  Service: Endoscopy;  Laterality: N/A;  . Balloon dilation  06/12/2012    Procedure: BALLOON DILATION;  Surgeon: Lafayette Dragon, MD;  Location: [facility];  Service: Endoscopy;  Laterality: N/A;  ?balloon  . Esophagogastroduodenoscopy  09/10/2012    Procedure: ESOPHAGOGASTRODUODENOSCOPY (EGD);  Surgeon: Lafayette Dragon, MD;  Location: [facility];  Service: Endoscopy;  Laterality: N/A;  . Hot hemostasis  09/10/2012    Procedure: HOT HEMOSTASIS (ARGON PLASMA COAGULATION/BICAP);  Surgeon: Lafayette Dragon, MD;  Location: [facility];  Service: Endoscopy;  Laterality: N/A;  . Esophagogastroduodenoscopy N/A 01/18/2013    Procedure: ESOPHAGOGASTRODUODENOSCOPY (EGD);  Surgeon: Lafayette Dragon, MD;  Location: [facility];  Service: Endoscopy;  Laterality: N/A;  . Esophagogastroduodenoscopy N/A 05/24/2013    Procedure: ESOPHAGOGASTRODUODENOSCOPY (EGD);  Surgeon: Jerene Bears, MD;  Location: [place];  Service: Gastroenterology;  Laterality: N/A;  . Hot hemostasis N/A 05/24/2013    Procedure: HOT HEMOSTASIS (ARGON PLASMA COAGULATION/BICAP);  Surgeon: Jerene Bears, MD;  Location: [place];  Service: Gastroenterology;  Laterality: N/A;  . US echocardiography  07/2014    mild LVH, EF 60-65%, normal wall motion, mild AR, mod dilated LA, mildly dilated RA, peak PA pressure 85mmHg  . Esophagogastroduodenoscopy N/A 10/20/2014    Procedure: ESOPHAGOGASTRODUODENOSCOPY (EGD);  Surgeon: Lafayette Dragon, MD;  Location: [facility];  Service: Endoscopy;  Laterality: N/A;  . Hot hemostasis N/A 10/20/2014    Procedure: HOT HEMOSTASIS (ARGON PLASMA COAGULATION/BICAP);  Surgeon: Lafayette Dragon, MD;  Location: [facility];  Service: Endoscopy;  Laterality: N/A;  . Enteroscopy N/A 02/25/2015    Procedure: ENTEROSCOPY;  Surgeon: [name] Ada, MD;  Location: [place];  Service: Endoscopy;  Laterality: N/A;  . Enteroscopy N/A 06/27/2015    Procedure: ENTEROSCOPY;  Surgeon: Manus Gunning, MD;   Location:  [facility];  Service: Gastroenterology;  Laterality: N/A;  . Eus  09/2015    antral CAVL, ablated; nodular erosive gastritis; mult pancreatic cysts rec rpt CT pancreas protocol or MRI to survey pancreas cysts 1-2 yrs (Dr Amie Critchley)    Family History  Problem Relation Age of Onset  . Heart disease Mother   . Cervical cancer Mother   . Kidney disease Mother   . Diabetes Mother   . Breast cancer Sister   . Multiple sclerosis Sister   . Colon cancer Neg Hx   . Breast cancer Maternal Aunt     x 2  . Diabetes Father   . Diabetes Sister   . Multiple sclerosis Other   . Heart attack Mother   . Heart attack Father   . Stroke Daughter   . Stroke Daughter     Social History   Social History  . Marital Status: Married    Spouse Name: jerry Ante  . Number of Children: 4  . Years of Education: 12   Occupational History  . Disabled   .     Social History Main Topics  . Smoking status: Former Smoker -- 1.50 packs/day for 25 years    Types: Cigarettes    Quit date: 08/26/1992  . Smokeless tobacco: Never Used  . Alcohol Use: No     Comment: [id number] "last alcoholic drink was years ago"  . Drug Use: No  . Sexual Activity: No   Other Topics Concern  . Not on file   Social History Narrative   Patient lives at home with her husband [name]).    Retired.   Caffeine- None    Right handed.      Dr. Olevia Perches now [facility] (referred to [facility] at [facility])   Dr. Burt Knack cards    Dr. Loanne Drilling endo   Dr Lenna Gilford pulm   Psych: Followed by [facility] (ph [phone], fax [phone]).    Review of Systems Intermittent cough Very SOB--- stable over the past month Has oxycodone on list for back pain--- someone stole hers (and daughter feels she wasn't necessarily taking them appropriately)    Objective:   Physical Exam  Constitutional: No distress.  Multiple complaints--difficult story to reconcile  Abdominal: Soft. Bowel sounds are normal. She exhibits no distension.  There is no rebound and no guarding.  Mild to moderate suprapubic tenderness  Musculoskeletal:  Tenderness wherever I touch on her back          Assessment & Plan:

## 2016-02-26 NOTE — Progress Notes (Signed)
Pre visit review using our clinic review tool, if applicable. No additional management support is needed unless otherwise documented below in the visit note. 

## 2016-02-26 NOTE — Assessment & Plan Note (Signed)
Story is really unclear 2+ leukocytes on urinalysis Despite daily fever, doesn't really seem to be systemically ill Will treat for 3 days and repeat if needed Back pain seems to be a separate issue

## 2016-02-26 NOTE — Addendum Note (Signed)
Addended by: Pilar Grammes on: 02/26/2016 12:08 PM   Modules accepted: Orders

## 2016-02-27 ENCOUNTER — Other Ambulatory Visit: Payer: Self-pay | Admitting: Family Medicine

## 2016-02-27 NOTE — Telephone Encounter (Signed)
plz phone in. 

## 2016-02-28 ENCOUNTER — Encounter (HOSPITAL_COMMUNITY)
Admission: RE | Admit: 2016-02-28 | Discharge: 2016-02-28 | Disposition: A | Discharge status: Home / Self Care | Payer: Medicare Other | Source: Ambulatory Visit | Attending: Pulmonary Disease | Admitting: Pulmonary Disease

## 2016-02-28 ENCOUNTER — Other Ambulatory Visit: Payer: Self-pay | Admitting: Internal Medicine

## 2016-02-28 ENCOUNTER — Telehealth: Payer: Self-pay | Admitting: Family Medicine

## 2016-02-28 DIAGNOSIS — Q2733 Arteriovenous malformation of digestive system vessel: Secondary | ICD-10-CM | POA: Insufficient documentation

## 2016-02-28 DIAGNOSIS — N289 Disorder of kidney and ureter, unspecified: Secondary | ICD-10-CM | POA: Diagnosis not present

## 2016-02-28 DIAGNOSIS — K7469 Other cirrhosis of liver: Secondary | ICD-10-CM | POA: Diagnosis not present

## 2016-02-28 DIAGNOSIS — K7581 Nonalcoholic steatohepatitis (NASH): Secondary | ICD-10-CM | POA: Diagnosis not present

## 2016-02-28 DIAGNOSIS — K7460 Unspecified cirrhosis of liver: Secondary | ICD-10-CM | POA: Insufficient documentation

## 2016-02-28 DIAGNOSIS — J9611 Chronic respiratory failure with hypoxia: Secondary | ICD-10-CM | POA: Diagnosis not present

## 2016-02-28 DIAGNOSIS — D61818 Other pancytopenia: Secondary | ICD-10-CM | POA: Diagnosis not present

## 2016-02-28 DIAGNOSIS — N183 Chronic kidney disease, stage 3 (moderate): Secondary | ICD-10-CM | POA: Diagnosis not present

## 2016-02-28 DIAGNOSIS — I5032 Chronic diastolic (congestive) heart failure: Secondary | ICD-10-CM | POA: Diagnosis not present

## 2016-02-28 DIAGNOSIS — E1142 Type 2 diabetes mellitus with diabetic polyneuropathy: Secondary | ICD-10-CM | POA: Diagnosis not present

## 2016-02-28 DIAGNOSIS — J449 Chronic obstructive pulmonary disease, unspecified: Secondary | ICD-10-CM | POA: Diagnosis not present

## 2016-02-28 DIAGNOSIS — I130 Hypertensive heart and chronic kidney disease with heart failure and stage 1 through stage 4 chronic kidney disease, or unspecified chronic kidney disease: Secondary | ICD-10-CM | POA: Diagnosis not present

## 2016-02-28 DIAGNOSIS — I2510 Atherosclerotic heart disease of native coronary artery without angina pectoris: Secondary | ICD-10-CM | POA: Diagnosis not present

## 2016-02-28 DIAGNOSIS — D649 Anemia, unspecified: Secondary | ICD-10-CM | POA: Diagnosis not present

## 2016-02-28 DIAGNOSIS — M797 Fibromyalgia: Secondary | ICD-10-CM | POA: Diagnosis not present

## 2016-02-28 DIAGNOSIS — M1991 Primary osteoarthritis, unspecified site: Secondary | ICD-10-CM | POA: Diagnosis not present

## 2016-02-28 DIAGNOSIS — E1122 Type 2 diabetes mellitus with diabetic chronic kidney disease: Secondary | ICD-10-CM | POA: Diagnosis not present

## 2016-02-28 LAB — POCT HEMOGLOBIN-HEMACUE: HEMOGLOBIN: 8.3 g/dL — AB (ref 12.0–15.0)

## 2016-02-28 MED ORDER — OXYCODONE HCL 5 MG PO TABS: 5.0000 mg | ORAL_TABLET | Freq: Four times a day (QID) | ORAL | Status: DC | PRN

## 2016-02-28 MED ORDER — DARBEPOETIN ALFA 200 MCG/0.4ML IJ SOSY
200.0000 ug | PREFILLED_SYRINGE | INTRAMUSCULAR | Status: DC
  Administered 2016-02-28: 200 ug via SUBCUTANEOUS

## 2016-02-28 MED ORDER — DARBEPOETIN ALFA 200 MCG/0.4ML IJ SOSY
PREFILLED_SYRINGE | INTRAMUSCULAR | Status: AC
  Filled 2016-02-28: qty 0.4

## 2016-02-28 NOTE — Telephone Encounter (Signed)
Jeanne Ivan nurse with care program left v/m; Tammy saw pt this morning and pt does not appear to be in any respiratory distress;no fluid,no crackles, lungs sound clear,no apparent trouble breathing; pt is out of pain med and thinks that is making pt anxious. Vitals were good.

## 2016-02-28 NOTE — Telephone Encounter (Signed)
Opened in error

## 2016-02-28 NOTE — Telephone Encounter (Signed)
Rx given to [name] to give to family member.

## 2016-02-28 NOTE — Telephone Encounter (Signed)
Rx called in as directed.   

## 2016-02-28 NOTE — Telephone Encounter (Signed)
I spoke with pt and she said she has too much to do today to make appt; pt is still having some difficulty breathing; pt said home health nurse came by this morning and told pt no real crackle sounds. Pt is going to hospital to visit her husband and if condition changes or worsens will go to ED if needed. Pt also request refill oxycodone (last printed #60 on 01/30/16). Last f/u 01/26/16 and last acute visit 02/26/16. Pt request cb when ready for pick up and request her sister to pick up rx today. Pt is out of med.

## 2016-02-28 NOTE — Telephone Encounter (Signed)
Noted. Thanks.  Agree. Refill oxycodone and update [place] with how she's doing with antibiotic for UTI. Wouldn't push ER on this patient.  Rx printed and in [place]' box.

## 2016-02-28 NOTE — Telephone Encounter (Signed)
Patient Name: [name]  DOB: 1941/04/24    Initial Comment Caller states saw dr on Mon, has been taking sulfa drugs for bacteria in urine, ran a fever all day yesterday, having trouble breathing   Nurse Assessment  Nurse: Andria Frames, RN, Aer[place] Date/Time (Eastern Time): 02/28/2016 8:23:49 AM  Confirm and document reason for call. If symptomatic, describe symptoms. You must click the next button to save text entered. ---Caller states, saw dr on Mon, has been taking sulfa drugs for bacteria in urine, ran a fever all day yesterday, having trouble breathing  Has the patient traveled out of the country within the last 30 days? ---No  Does the patient have any new or worsening symptoms? ---Yes  Will a triage be completed? ---Yes  Related visit to physician within the last 2 weeks? ---Yes  Does the PT have any chronic conditions? (i.e. diabetes, asthma, etc.) ---Yes  List chronic conditions. ---diabetes, asthma, high blood pressure  Is this a behavioral health or substance abuse call? ---No     Guidelines    Guideline Title Affirmed Question Affirmed Notes  Cough - Acute Productive Sounds like a life-threatening emergency to the triager    Final Disposition User   Call EMS 911 Now Hensel, RN, Aeriel    Comments  Caller states, they are not going to call 911 at this time. Nurse reiterated that pt does call related to difficulty breathing and some confusion issues.   Disagree/Comply: Disagree  Disagree/Comply Reason: Disagree with instructions

## 2016-02-29 ENCOUNTER — Encounter: Payer: Self-pay | Admitting: *Deleted

## 2016-02-29 DIAGNOSIS — J9611 Chronic respiratory failure with hypoxia: Secondary | ICD-10-CM | POA: Diagnosis not present

## 2016-02-29 DIAGNOSIS — E1142 Type 2 diabetes mellitus with diabetic polyneuropathy: Secondary | ICD-10-CM | POA: Diagnosis not present

## 2016-02-29 DIAGNOSIS — I5032 Chronic diastolic (congestive) heart failure: Secondary | ICD-10-CM | POA: Diagnosis not present

## 2016-02-29 DIAGNOSIS — N183 Chronic kidney disease, stage 3 (moderate): Secondary | ICD-10-CM | POA: Diagnosis not present

## 2016-02-29 DIAGNOSIS — K7581 Nonalcoholic steatohepatitis (NASH): Secondary | ICD-10-CM | POA: Diagnosis not present

## 2016-02-29 DIAGNOSIS — K7469 Other cirrhosis of liver: Secondary | ICD-10-CM | POA: Diagnosis not present

## 2016-02-29 DIAGNOSIS — I2510 Atherosclerotic heart disease of native coronary artery without angina pectoris: Secondary | ICD-10-CM | POA: Diagnosis not present

## 2016-02-29 DIAGNOSIS — M797 Fibromyalgia: Secondary | ICD-10-CM | POA: Diagnosis not present

## 2016-02-29 DIAGNOSIS — D61818 Other pancytopenia: Secondary | ICD-10-CM | POA: Diagnosis not present

## 2016-02-29 DIAGNOSIS — M1991 Primary osteoarthritis, unspecified site: Secondary | ICD-10-CM | POA: Diagnosis not present

## 2016-02-29 DIAGNOSIS — I130 Hypertensive heart and chronic kidney disease with heart failure and stage 1 through stage 4 chronic kidney disease, or unspecified chronic kidney disease: Secondary | ICD-10-CM | POA: Diagnosis not present

## 2016-02-29 DIAGNOSIS — E1122 Type 2 diabetes mellitus with diabetic chronic kidney disease: Secondary | ICD-10-CM | POA: Diagnosis not present

## 2016-02-29 DIAGNOSIS — J449 Chronic obstructive pulmonary disease, unspecified: Secondary | ICD-10-CM | POA: Diagnosis not present

## 2016-03-01 ENCOUNTER — Telehealth: Payer: Self-pay | Admitting: Family Medicine

## 2016-03-01 ENCOUNTER — Emergency Department (HOSPITAL_COMMUNITY): Payer: Medicare Other

## 2016-03-01 ENCOUNTER — Encounter (HOSPITAL_COMMUNITY): Payer: Self-pay | Admitting: Emergency Medicine

## 2016-03-01 ENCOUNTER — Observation Stay (HOSPITAL_COMMUNITY)
Admission: EM | Admit: 2016-03-01 | Discharge: 2016-03-03 | Disposition: A | Discharge status: Home / Self Care | Payer: Medicare Other | Attending: Internal Medicine | Admitting: Internal Medicine

## 2016-03-01 DIAGNOSIS — R0609 Other forms of dyspnea: Secondary | ICD-10-CM

## 2016-03-01 DIAGNOSIS — Z794 Long term (current) use of insulin: Secondary | ICD-10-CM | POA: Insufficient documentation

## 2016-03-01 DIAGNOSIS — R531 Weakness: Secondary | ICD-10-CM | POA: Diagnosis not present

## 2016-03-01 DIAGNOSIS — K7581 Nonalcoholic steatohepatitis (NASH): Secondary | ICD-10-CM | POA: Insufficient documentation

## 2016-03-01 DIAGNOSIS — R0600 Dyspnea, unspecified: Secondary | ICD-10-CM

## 2016-03-01 DIAGNOSIS — E119 Type 2 diabetes mellitus without complications: Secondary | ICD-10-CM | POA: Diagnosis not present

## 2016-03-01 DIAGNOSIS — I5032 Chronic diastolic (congestive) heart failure: Secondary | ICD-10-CM | POA: Diagnosis present

## 2016-03-01 DIAGNOSIS — I2510 Atherosclerotic heart disease of native coronary artery without angina pectoris: Secondary | ICD-10-CM | POA: Diagnosis not present

## 2016-03-01 DIAGNOSIS — I10 Essential (primary) hypertension: Secondary | ICD-10-CM | POA: Diagnosis present

## 2016-03-01 DIAGNOSIS — Z87891 Personal history of nicotine dependence: Secondary | ICD-10-CM | POA: Diagnosis not present

## 2016-03-01 DIAGNOSIS — Z6839 Body mass index (BMI) 39.0-39.9, adult: Secondary | ICD-10-CM | POA: Diagnosis not present

## 2016-03-01 DIAGNOSIS — F419 Anxiety disorder, unspecified: Secondary | ICD-10-CM | POA: Insufficient documentation

## 2016-03-01 DIAGNOSIS — I110 Hypertensive heart disease with heart failure: Secondary | ICD-10-CM | POA: Insufficient documentation

## 2016-03-01 DIAGNOSIS — Z66 Do not resuscitate: Secondary | ICD-10-CM | POA: Diagnosis not present

## 2016-03-01 DIAGNOSIS — K3189 Other diseases of stomach and duodenum: Secondary | ICD-10-CM | POA: Diagnosis not present

## 2016-03-01 DIAGNOSIS — R0602 Shortness of breath: Secondary | ICD-10-CM | POA: Diagnosis not present

## 2016-03-01 DIAGNOSIS — N179 Acute kidney failure, unspecified: Secondary | ICD-10-CM | POA: Insufficient documentation

## 2016-03-01 DIAGNOSIS — J449 Chronic obstructive pulmonary disease, unspecified: Secondary | ICD-10-CM | POA: Diagnosis present

## 2016-03-01 DIAGNOSIS — J42 Unspecified chronic bronchitis: Secondary | ICD-10-CM | POA: Insufficient documentation

## 2016-03-01 DIAGNOSIS — Z9981 Dependence on supplemental oxygen: Secondary | ICD-10-CM | POA: Diagnosis not present

## 2016-03-01 DIAGNOSIS — D649 Anemia, unspecified: Principal | ICD-10-CM | POA: Insufficient documentation

## 2016-03-01 DIAGNOSIS — K7460 Unspecified cirrhosis of liver: Secondary | ICD-10-CM | POA: Diagnosis present

## 2016-03-01 DIAGNOSIS — J9611 Chronic respiratory failure with hypoxia: Secondary | ICD-10-CM | POA: Diagnosis not present

## 2016-03-01 LAB — COMPREHENSIVE METABOLIC PANEL
ALBUMIN: 3 g/dL — AB (ref 3.5–5.0)
ALK PHOS: 119 U/L (ref 38–126)
ALT: 29 U/L (ref 14–54)
AST: 39 U/L (ref 15–41)
Anion gap: 8 (ref 5–15)
BILIRUBIN TOTAL: 1.3 mg/dL — AB (ref 0.3–1.2)
BUN: 47 mg/dL — AB (ref 6–20)
CALCIUM: 8.9 mg/dL (ref 8.9–10.3)
CO2: 24 mmol/L (ref 22–32)
Chloride: 108 mmol/L (ref 101–111)
Creatinine, Ser: 1.61 mg/dL — ABNORMAL HIGH (ref 0.44–1.00)
GFR calc Af Amer: 36 mL/min — ABNORMAL LOW (ref >60)
GFR calc non Af Amer: 31 mL/min — ABNORMAL LOW (ref >60)
GLUCOSE: 126 mg/dL — AB (ref 65–99)
POTASSIUM: 3.8 mmol/L (ref 3.5–5.1)
SODIUM: 140 mmol/L (ref 135–145)
TOTAL PROTEIN: 5.7 g/dL — AB (ref 6.5–8.1)

## 2016-03-01 LAB — IRON AND TIBC
Iron: 35 ug/dL (ref 28–170)
SATURATION RATIOS: 10 % — AB (ref 10.4–31.8)
TIBC: 342 ug/dL (ref 250–450)
UIBC: 307 ug/dL

## 2016-03-01 LAB — PREPARE RBC (CROSSMATCH)

## 2016-03-01 LAB — CBC
HEMATOCRIT: 24.5 % — AB (ref 36.0–46.0)
HEMOGLOBIN: 8 g/dL — AB (ref 12.0–15.0)
MCH: 31.7 pg (ref 26.0–34.0)
MCHC: 32.7 g/dL (ref 30.0–36.0)
MCV: 97.2 fL (ref 78.0–100.0)
Platelets: 90 10*3/uL — ABNORMAL LOW (ref 150–400)
RBC: 2.52 MIL/uL — ABNORMAL LOW (ref 3.87–5.11)
RDW: 17 % — ABNORMAL HIGH (ref 11.5–15.5)
WBC: 3.8 10*3/uL — AB (ref 4.0–10.5)

## 2016-03-01 LAB — GLUCOSE, CAPILLARY
Glucose-Capillary: 67 mg/dL (ref 65–99)
Glucose-Capillary: 189 mg/dL — ABNORMAL HIGH (ref 65–99)

## 2016-03-01 LAB — PROTIME-INR
INR: 1.21 (ref 0.00–1.49)
Prothrombin Time: 15.5 seconds — ABNORMAL HIGH (ref 11.6–15.2)

## 2016-03-01 LAB — RETICULOCYTES
RBC.: 2.49 MIL/uL — ABNORMAL LOW (ref 3.87–5.11)
RETIC CT PCT: 5.1 % — AB (ref 0.4–3.1)
Retic Count, Absolute: 127 10*3/uL (ref 19.0–186.0)

## 2016-03-01 LAB — URINALYSIS, ROUTINE W REFLEX MICROSCOPIC
BILIRUBIN URINE: NEGATIVE
GLUCOSE, UA: NEGATIVE mg/dL
HGB URINE DIPSTICK: NEGATIVE
Ketones, ur: NEGATIVE mg/dL
Leukocytes, UA: NEGATIVE
NITRITE: NEGATIVE
Protein, ur: NEGATIVE mg/dL
SPECIFIC GRAVITY, URINE: 1.02 (ref 1.005–1.030)
pH: 5.5 (ref 5.0–8.0)

## 2016-03-01 LAB — FOLATE: Folate: 42.3 ng/mL (ref >5.9)

## 2016-03-01 LAB — I-STAT TROPONIN, ED
TROPONIN I, POC: 0.03 ng/mL (ref 0.00–0.08)
Troponin i, poc: 0.01 ng/mL (ref 0.00–0.08)

## 2016-03-01 LAB — POC OCCULT BLOOD, ED: FECAL OCCULT BLD: NEGATIVE

## 2016-03-01 LAB — FERRITIN: FERRITIN: 46 ng/mL (ref 11–307)

## 2016-03-01 LAB — CBG MONITORING, ED: GLUCOSE-CAPILLARY: 124 mg/dL — AB (ref 65–99)

## 2016-03-01 LAB — VITAMIN B12: Vitamin B-12: 2251 pg/mL — ABNORMAL HIGH (ref 180–914)

## 2016-03-01 LAB — BRAIN NATRIURETIC PEPTIDE: B Natriuretic Peptide: 87.5 pg/mL (ref 0.0–100.0)

## 2016-03-01 MED ORDER — ONDANSETRON HCL 4 MG PO TABS: 4.0000 mg | ORAL_TABLET | Freq: Four times a day (QID) | ORAL | Status: DC | PRN

## 2016-03-01 MED ORDER — ALBUTEROL SULFATE (2.5 MG/3ML) 0.083% IN NEBU: 2.5000 mg | INHALATION_SOLUTION | Freq: Four times a day (QID) | RESPIRATORY_TRACT | Status: DC | PRN

## 2016-03-01 MED ORDER — SIMVASTATIN 40 MG PO TABS
40.0000 mg | ORAL_TABLET | Freq: Every day | ORAL | Status: DC
  Administered 2016-03-01 – 2016-03-02 (×2): 40 mg via ORAL
  Filled 2016-03-01 (×2): qty 1

## 2016-03-01 MED ORDER — SODIUM CHLORIDE 0.9 % IV SOLN: Freq: Once | INTRAVENOUS | Status: DC

## 2016-03-01 MED ORDER — ISOSORBIDE MONONITRATE ER 30 MG PO TB24
15.0000 mg | ORAL_TABLET | Freq: Every day | ORAL | Status: DC
  Administered 2016-03-01 – 2016-03-03 (×3): 15 mg via ORAL
  Filled 2016-03-01 (×4): qty 1

## 2016-03-01 MED ORDER — ROPINIROLE HCL 1 MG PO TABS
4.0000 mg | ORAL_TABLET | Freq: Every day | ORAL | Status: DC
  Administered 2016-03-01 – 2016-03-02 (×2): 4 mg via ORAL
  Filled 2016-03-01 (×2): qty 4

## 2016-03-01 MED ORDER — ALBUTEROL SULFATE (5 MG/ML) 0.5% IN NEBU: 2.5000 mg | INHALATION_SOLUTION | Freq: Four times a day (QID) | RESPIRATORY_TRACT | Status: DC

## 2016-03-01 MED ORDER — ACETAMINOPHEN 325 MG PO TABS: 650.0000 mg | ORAL_TABLET | Freq: Four times a day (QID) | ORAL | Status: DC | PRN

## 2016-03-01 MED ORDER — ALPRAZOLAM 0.5 MG PO TABS
0.5000 mg | ORAL_TABLET | Freq: Two times a day (BID) | ORAL | Status: DC | PRN
  Administered 2016-03-01 – 2016-03-03 (×4): 0.5 mg via ORAL
  Filled 2016-03-01 (×4): qty 1

## 2016-03-01 MED ORDER — VENLAFAXINE HCL ER 37.5 MG PO CP24
37.5000 mg | ORAL_CAPSULE | Freq: Every day | ORAL | Status: DC
  Administered 2016-03-02 – 2016-03-03 (×2): 37.5 mg via ORAL
  Filled 2016-03-01 (×2): qty 1

## 2016-03-01 MED ORDER — NITROGLYCERIN 0.4 MG SL SUBL: 0.4000 mg | SUBLINGUAL_TABLET | SUBLINGUAL | Status: DC | PRN

## 2016-03-01 MED ORDER — ALBUTEROL SULFATE HFA 108 (90 BASE) MCG/ACT IN AERS: 2.0000 | INHALATION_SPRAY | Freq: Four times a day (QID) | RESPIRATORY_TRACT | Status: DC | PRN

## 2016-03-01 MED ORDER — LACTULOSE 10 GM/15ML PO SOLN
20.0000 g | Freq: Three times a day (TID) | ORAL | Status: DC
  Administered 2016-03-01 – 2016-03-03 (×4): 20 g via ORAL
  Filled 2016-03-01 (×6): qty 30

## 2016-03-01 MED ORDER — ALBUTEROL SULFATE (2.5 MG/3ML) 0.083% IN NEBU
2.5000 mg | INHALATION_SOLUTION | Freq: Four times a day (QID) | RESPIRATORY_TRACT | Status: DC
  Administered 2016-03-01 (×2): 2.5 mg via RESPIRATORY_TRACT
  Filled 2016-03-01: qty 3

## 2016-03-01 MED ORDER — PANTOPRAZOLE SODIUM 40 MG PO TBEC
40.0000 mg | DELAYED_RELEASE_TABLET | Freq: Two times a day (BID) | ORAL | Status: DC
  Administered 2016-03-01 – 2016-03-03 (×5): 40 mg via ORAL
  Filled 2016-03-01 (×5): qty 1

## 2016-03-01 MED ORDER — MOMETASONE FURO-FORMOTEROL FUM 200-5 MCG/ACT IN AERO
2.0000 | INHALATION_SPRAY | Freq: Two times a day (BID) | RESPIRATORY_TRACT | Status: DC
  Administered 2016-03-01 – 2016-03-03 (×4): 2 via RESPIRATORY_TRACT
  Filled 2016-03-01: qty 8.8

## 2016-03-01 MED ORDER — ALBUTEROL SULFATE (2.5 MG/3ML) 0.083% IN NEBU
2.5000 mg | INHALATION_SOLUTION | Freq: Three times a day (TID) | RESPIRATORY_TRACT | Status: DC
  Administered 2016-03-02 – 2016-03-03 (×4): 2.5 mg via RESPIRATORY_TRACT
  Filled 2016-03-01 (×5): qty 3

## 2016-03-01 MED ORDER — ACETAMINOPHEN 650 MG RE SUPP: 650.0000 mg | Freq: Four times a day (QID) | RECTAL | Status: DC | PRN

## 2016-03-01 MED ORDER — ADULT MULTIVITAMIN W/MINERALS CH
1.0000 | ORAL_TABLET | Freq: Every day | ORAL | Status: DC
  Administered 2016-03-01 – 2016-03-03 (×3): 1 via ORAL
  Filled 2016-03-01 (×3): qty 1

## 2016-03-01 MED ORDER — OXYCODONE HCL 5 MG PO TABS
5.0000 mg | ORAL_TABLET | Freq: Four times a day (QID) | ORAL | Status: DC | PRN
  Administered 2016-03-02 – 2016-03-03 (×3): 5 mg via ORAL
  Filled 2016-03-01 (×3): qty 1

## 2016-03-01 MED ORDER — LACTULOSE ENCEPHALOPATHY 10 GM/15ML PO SOLN: 20.0000 g | Freq: Three times a day (TID) | ORAL | Status: DC

## 2016-03-01 MED ORDER — ALBUTEROL SULFATE (2.5 MG/3ML) 0.083% IN NEBU
INHALATION_SOLUTION | RESPIRATORY_TRACT | Status: AC
  Administered 2016-03-01: 2.5 mg via RESPIRATORY_TRACT
  Filled 2016-03-01: qty 3

## 2016-03-01 MED ORDER — SODIUM CHLORIDE 0.9% FLUSH
3.0000 mL | Freq: Two times a day (BID) | INTRAVENOUS | Status: DC
  Administered 2016-03-01 – 2016-03-03 (×5): 3 mL via INTRAVENOUS

## 2016-03-01 MED ORDER — CENTRUM SILVER PO TABS: ORAL_TABLET | Freq: Every day | ORAL | Status: DC

## 2016-03-01 MED ORDER — FERROUS SULFATE 325 (65 FE) MG PO TABS
325.0000 mg | ORAL_TABLET | Freq: Every day | ORAL | Status: DC
  Administered 2016-03-02 – 2016-03-03 (×2): 325 mg via ORAL
  Filled 2016-03-01 (×2): qty 1

## 2016-03-01 MED ORDER — ONDANSETRON HCL 4 MG/2ML IJ SOLN: 4.0000 mg | Freq: Four times a day (QID) | INTRAMUSCULAR | Status: DC | PRN

## 2016-03-01 MED ORDER — GUAIFENESIN ER 600 MG PO TB12
600.0000 mg | ORAL_TABLET | Freq: Two times a day (BID) | ORAL | Status: DC
  Administered 2016-03-01 – 2016-03-03 (×5): 600 mg via ORAL
  Filled 2016-03-01 (×5): qty 1

## 2016-03-01 MED ORDER — RIFAXIMIN 550 MG PO TABS
550.0000 mg | ORAL_TABLET | Freq: Two times a day (BID) | ORAL | Status: DC
  Administered 2016-03-01 – 2016-03-03 (×5): 550 mg via ORAL
  Filled 2016-03-01 (×5): qty 1

## 2016-03-01 MED ORDER — IPRATROPIUM-ALBUTEROL 0.5-2.5 (3) MG/3ML IN SOLN
3.0000 mL | Freq: Once | RESPIRATORY_TRACT | Status: AC
  Administered 2016-03-01: 3 mL via RESPIRATORY_TRACT
  Filled 2016-03-01: qty 3

## 2016-03-01 MED ORDER — QUETIAPINE FUMARATE 25 MG PO TABS
200.0000 mg | ORAL_TABLET | Freq: Every day | ORAL | Status: DC
  Administered 2016-03-01 – 2016-03-02 (×2): 200 mg via ORAL
  Filled 2016-03-01 (×2): qty 8

## 2016-03-01 MED ORDER — FUROSEMIDE 10 MG/ML IJ SOLN: 40.0000 mg | Freq: Once | INTRAMUSCULAR | Status: DC

## 2016-03-01 MED ORDER — INSULIN ASPART 100 UNIT/ML ~~LOC~~ SOLN
0.0000 [IU] | Freq: Every day | SUBCUTANEOUS | Status: DC
  Administered 2016-03-02: 2 [IU] via SUBCUTANEOUS

## 2016-03-01 MED ORDER — BUPROPION HCL ER (XL) 150 MG PO TB24
150.0000 mg | ORAL_TABLET | Freq: Every day | ORAL | Status: DC
  Administered 2016-03-01 – 2016-03-03 (×3): 150 mg via ORAL
  Filled 2016-03-01 (×3): qty 1

## 2016-03-01 MED ORDER — INSULIN ASPART 100 UNIT/ML ~~LOC~~ SOLN
0.0000 [IU] | Freq: Three times a day (TID) | SUBCUTANEOUS | Status: DC
  Administered 2016-03-02: 2 [IU] via SUBCUTANEOUS
  Administered 2016-03-02: 5 [IU] via SUBCUTANEOUS
  Administered 2016-03-03: 2 [IU] via SUBCUTANEOUS

## 2016-03-01 MED ORDER — NYSTATIN 100000 UNIT/ML MT SUSP
5.0000 mL | Freq: Four times a day (QID) | OROMUCOSAL | Status: DC
  Administered 2016-03-01 – 2016-03-03 (×6): 500000 [IU] via ORAL
  Filled 2016-03-01 (×6): qty 5

## 2016-03-01 MED ORDER — INSULIN GLARGINE 100 UNIT/ML ~~LOC~~ SOLN
30.0000 [IU] | Freq: Every day | SUBCUTANEOUS | Status: DC
  Filled 2016-03-01 (×2): qty 0.3

## 2016-03-01 MED ORDER — FUROSEMIDE 10 MG/ML IJ SOLN: 40.0000 mg | Freq: Two times a day (BID) | INTRAMUSCULAR | Status: DC

## 2016-03-01 NOTE — ED Notes (Signed)
Attempted to call report to 5W 

## 2016-03-01 NOTE — Telephone Encounter (Signed)
Patient Name: [name] DOB: 09-16-1940 Initial Comment caller states she is having trouble breathing and a hacking cough Nurse Assessment Nurse: Vallery Sa, RN, [place] Date/Time (Eastern Time): 03/01/2016 8:17:43 AM Confirm and document reason for call. If symptomatic, describe symptoms. You must click the next button to save text entered. ---Caller states she developed a productive cough yesterday and she is coughing up something brown in color today. No blood present. No severe breathing or swallowing difficulty. She developed a fever about a week ago that she thinks is moderate grade. She took a Asthma breathing treatment about 20 minutes ago. Has the patient traveled out of the country within the last 30 days? ---No Does the patient have any new or worsening symptoms? ---Yes Will a triage be completed? ---Yes Related visit to physician within the last 2 weeks? ---Yes Does the PT have any chronic conditions? (i.e. diabetes, asthma, etc.) ---Yes List chronic conditions. ---Asthma, COPD, CHF, Liver Failure, Diabetes Is this a behavioral health or substance abuse call? ---No Guidelines Guideline Title Affirmed Question Affirmed Notes Asthma Attack Severe difficulty breathing (e.g., struggling for each breath, speak in single words, pulse > 120) Final Disposition User Call EMS 911 Now Vallery Sa, RN, [place] Disagree/Comply: Comply Caller states they will call 911 now. Reena notified while on call with her.

## 2016-03-01 NOTE — H&P (Signed)
History and Physical    [name] [id number] DOB: 10/14/1940 DOA: 03/01/2016  PCP: Ria Bush, MD Patient coming from: home  Chief Complaint: DOE  HPI: [name] is a nervous, anxious 75 y.o. female with medical history significant for diabetes, chronic respiratory failure on 2 L oxygen, COPD, CHF, cirrhosis, hypertension, iron deficiency anemia since to the emergency department with the chief complaint of dyspnea on exertion. Initial evaluation concerning for symptomatic anemia.  Information is obtained from the patient. He reports being in her usual state of health until today she awakened walk to the bathroom and became very short of breath and "weak all over". Associated symptoms include intermittent cough with intermittent sputum production. She denies chest pain palpitations headache dizziness syncope or near-syncope. She denies lower extremity edema or orthopnea. She denies any abdominal pain nausea vomiting diarrhea constipation. She denies dysuria hematuria frequency or urgency. She denies melena or bright red blood per rectum. She does report some chills and subjective fever.    ED Course: Emergency department she did demonstrate increased oxygen demand and worsening shortness of breath with ambulation. Oxygen via nasal cannula increased to 3 L  Review of Systems: As per HPI otherwise 10 point review of systems negative.   Ambulatory Status: Ambulates with a cane  Past Medical History  Diagnosis Date  . Chronic airway obstruction, not elsewhere classified   . Unspecified chronic bronchitis ([place])   . Unspecified essential hypertension   . Non-obstructive CAD   . Palpitations   . Mild aortic stenosis     a. 03/2014 Valve area (VTI): 2.89 cm^2, Valve area (Vmax): 2.7 cm^2.  . Pure hypercholesterolemia   . Morbid obesity ([place])   . Esophageal reflux   . GAVE (gastric antral vascular ectasia)     angiodysplasia; s/p multiple APCs, discussing RFA with [facility] GI Dr Newman Pies (06/2015)  . Diverticulosis of colon (without mention of hemorrhage)   . Benign neoplasm of colon   . Osteoarthrosis, unspecified whether generalized or localized, unspecified site   . Osteoporosis, unspecified   . Restless legs syndrome (RLS)   . Major depressive disorder, recurrent episode, severe, specified as with psychotic behavior   . Iron deficiency anemia secondary to blood loss (chronic)     a. frequent PRBC transfusions.  . Coarse tremors     a. arms.  . Carpal tunnel syndrome on both sides   . Chronic diastolic CHF (congestive heart failure) ([place])     a. 03/26/2014 Echo: EF 55-60%, no rwma, mild AS/AI, mod-sev Ca2+ MV annulus, mildly to mod dil LA.  [name] Asthma   . Type II diabetes mellitus ([place])   . H/O hiatal hernia   . 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 dx'd 1990's  . Adenomatous colon polyp   . Midsternal chest pain     a. conservatively managed ->poor candidate for cath/anticoagulation.  [name] Anxiety   . Portal hypertensive gastropathy   . Falls frequently     completed HHPT/OT 06/2014, PT unmet goals  . Recurrent UTI (urinary tract infection)     h/o hospitalization with urosepsis 2015, but thought large component colonization/bacteriuria, only treat if symptomatic ([place])  . Hiatal hernia   . Thrombocytopenia ([place])   . Protein calorie malnutrition ([place])   . On home oxygen therapy     "3L; 24/7" (11/29/2015)  . OSA (obstructive sleep apnea)     a. does not use CPAP. (01/25/2015)  . Hepatitis   .  CAP (community acquired pneumonia) 2015    Past Surgical History  Procedure Laterality Date  . Hemorroidectomy    . Appendectomy    . Vaginal hysterectomy    . Breast biopsy      bilateral  . Cesarean section      x 3  . Appendectomy    . Esophagogastroduodenoscopy  06/12/2012    Procedure: ESOPHAGOGASTRODUODENOSCOPY (EGD);  Surgeon: Lafayette Dragon, MD;  Location: [facility];  Service: Endoscopy;  Laterality:  N/A;  . Balloon dilation  06/12/2012    Procedure: BALLOON DILATION;  Surgeon: Lafayette Dragon, MD;  Location: [facility];  Service: Endoscopy;  Laterality: N/A;  ?balloon  . Esophagogastroduodenoscopy  09/10/2012    Procedure: ESOPHAGOGASTRODUODENOSCOPY (EGD);  Surgeon: Lafayette Dragon, MD;  Location: [facility];  Service: Endoscopy;  Laterality: N/A;  . Hot hemostasis  09/10/2012    Procedure: HOT HEMOSTASIS (ARGON PLASMA COAGULATION/BICAP);  Surgeon: Lafayette Dragon, MD;  Location: [facility];  Service: Endoscopy;  Laterality: N/A;  . Esophagogastroduodenoscopy N/A 01/18/2013    Procedure: ESOPHAGOGASTRODUODENOSCOPY (EGD);  Surgeon: Lafayette Dragon, MD;  Location: [facility] ENDOSCOPY;  Service: Endoscopy;  Laterality: N/A;  . Esophagogastroduodenoscopy N/A 05/24/2013    Procedure: ESOPHAGOGASTRODUODENOSCOPY (EGD);  Surgeon: Jerene Bears, MD;  Location: [place];  Service: Gastroenterology;  Laterality: N/A;  . Hot hemostasis N/A 05/24/2013    Procedure: HOT HEMOSTASIS (ARGON PLASMA COAGULATION/BICAP);  Surgeon: Jerene Bears, MD;  Location: [place];  Service: Gastroenterology;  Laterality: N/A;  . US echocardiography  07/2014    mild LVH, EF 60-65%, normal wall motion, mild AR, mod dilated LA, mildly dilated RA, peak PA pressure 32mmHg  . Esophagogastroduodenoscopy N/A 10/20/2014    Procedure: ESOPHAGOGASTRODUODENOSCOPY (EGD);  Surgeon: Lafayette Dragon, MD;  Location: [facility];  Service: Endoscopy;  Laterality: N/A;  . Hot hemostasis N/A 10/20/2014    Procedure: HOT HEMOSTASIS (ARGON PLASMA COAGULATION/BICAP);  Surgeon: Lafayette Dragon, MD;  Location: [facility];  Service: Endoscopy;  Laterality: N/A;  . Enteroscopy N/A 02/25/2015    Procedure: ENTEROSCOPY;  Surgeon: [name] Ada, MD;  Location: [place];  Service: Endoscopy;  Laterality: N/A;  . Enteroscopy N/A 06/27/2015    Procedure: ENTEROSCOPY;  Surgeon: Manus Gunning, MD;  Location: [facility];  Service: Gastroenterology;   Laterality: N/A;  . Eus  09/2015    antral CAVL, ablated; nodular erosive gastritis; mult pancreatic cysts rec rpt CT pancreas protocol or MRI to survey pancreas cysts 1-2 yrs (Dr Amie Critchley)    Social History   Social History  . Marital Status: Married    Spouse Name: jerry Kraszewski  . Number of Children: 4  . Years of Education: 12   Occupational History  . Disabled   .     Social History Main Topics  . Smoking status: Former Smoker -- 1.50 packs/day for 25 years    Types: Cigarettes    Quit date: 08/26/1992  . Smokeless tobacco: Never Used  . Alcohol Use: No     Comment: [id number] "last alcoholic drink was years ago"  . Drug Use: No  . Sexual Activity: No   Other Topics Concern  . Not on file   Social History Narrative   Patient lives at home with her husband [name]).    Retired.   Caffeine- None    Right handed.      Dr. Olevia Perches now [facility] (referred to [facility])   Dr. Burt Knack cards    Dr. Loanne Drilling endo  Dr Lenna Gilford pulm   Psych: Followed by [facility] (ph [phone], fax [phone]).     Allergies  Allergen Reactions  . Acetaminophen Other (See Comments)    Liver dysfunction  . Nsaids Other (See Comments)    Liver dysfunction   . Aspirin Other (See Comments)    Causes nosebleeds  . Theophylline Nausea And Vomiting  . Penicillin G Itching  . Penicillins Itching    Has patient had a PCN reaction causing immediate rash, facial/tongue/throat swelling, SOB or lightheadedness with hypotension: unknown Has patient had a PCN reaction causing severe rash involving mucus membranes or skin necrosis: unknown Has patient had a PCN reaction that required hospitalization unknown Has patient had a PCN reaction occurring within the last 10 years: yes If all of the above answers are "NO", then may proceed with Cephalosporin use.     Family History  Problem Relation Age of Onset  . Heart disease Mother   . Cervical cancer Mother   . Kidney disease Mother    . Diabetes Mother   . Breast cancer Sister   . Multiple sclerosis Sister   . Colon cancer Neg Hx   . Breast cancer Maternal Aunt     x 2  . Diabetes Father   . Diabetes Sister   . Multiple sclerosis Other   . Heart attack Mother   . Heart attack Father   . Stroke Daughter   . Stroke Daughter     Prior to Admission medications   Medication Sig Start Date End Date Taking? Authorizing Provider  ADVAIR DISKUS 250-50 MCG/DOSE AEPB INHALE 1 PUFF INTO LUNGS TWICE A DAY 05/29/15   Ria Bush, MD  albuterol (PROAIR HFA) 108 (90 BASE) MCG/ACT inhaler Inhale 2 puffs into the lungs every 6 (six) hours as needed for wheezing or shortness of breath. Reported on 08/18/2015    Historical Provider, MD  ALPRAZolam Duanne Moron) 1 MG tablet TAKE 1/2 TO 1 TABLET TWICE A DAY AS NEEDED FOR ANXIETY AND SLEEP 02/27/16   Ria Bush, MD  B Complex-C ([facility] B COMPLEX PLUS C) TABS TAKE ONE CAPSULE BY MOUTH EVERY DAY 11/16/15   Ria Bush, MD  buPROPion (WELLBUTRIN XL) 150 MG 24 hr tablet Take 150 mg by mouth daily.    Historical Provider, MD  desvenlafaxine (PRISTIQ) 50 MG 24 hr tablet Take 3 tablets (150 mg total) by mouth daily. 05/12/15   Ria Bush, MD  diclofenac sodium (VOLTAREN) 1 % GEL Apply 1 application topically 3 (three) times daily. 01/11/16   Ria Bush, MD  ferrous sulfate 325 (65 FE) MG tablet TAKE 1 TABLET BY MOUTH EVERY DAY WITH BREAKFAST 01/08/16   Manus Gunning, MD  glucose blood (ONE TOUCH ULTRA TEST) test strip Check blood sugar 4 times daily 10/16/15   Ria Bush, MD  guaiFENesin (MUCINEX) 600 MG 12 hr tablet Take 600 mg by mouth 2 (two) times daily.     Historical Provider, MD  guaiFENesin-codeine 100-10 MG/5ML syrup Take 5 mLs by mouth 2 (two) times daily as needed. cough 12/13/15   Historical Provider, MD  Insulin Glargine (LANTUS SOLOSTAR) 100 UNIT/ML Solostar Pen Inject 120 Units into the skin every morning. 04/13/15   Renato Shin, MD  insulin lispro  (HUMALOG KWIKPEN) 100 UNIT/ML KiwkPen Inject 5 Units into the skin 3 (three) times daily with meals. Take only if blood sugar is over 300    Historical Provider, MD  Insulin Pen Needle (B-D ULTRAFINE III SHORT PEN) 31G X 8 MM MISC  Use as directed to inject insulin twice daily. Dx: E11.8 10/13/15   Renato Shin, MD  isosorbide mononitrate (IMDUR) 30 MG 24 hr tablet Take 0.5 tablets (15 mg total) by mouth daily. 12/19/15   Liliane Shi, PA-C  lactulose, encephalopathy, (CHRONULAC) 10 GM/15ML SOLN Take 20 g by mouth 3 (three) times daily.  11/21/15   Ria Bush, MD  levalbuterol Penne Lash) 0.63 MG/3ML nebulizer solution Take 0.63 mg by nebulization every 8 (eight) hours as needed for wheezing or shortness of breath.  06/05/14   Historical Provider, MD  loratadine (CLARITIN) 10 MG tablet TAKE 1 TABLET BY MOUTH EVERY DAY 07/14/15   Ria Bush, MD  metolazone (ZAROXOLYN) 2.5 MG tablet TAKE AS DIRECTED FOR 4 LB WEIGHT GAIN 01/01/16   Sherren Mocha, MD  Multiple Vitamins-Minerals (CENTRUM SILVER PO) Take 1 tablet by mouth daily.     Historical Provider, MD  mupirocin cream (BACTROBAN) 2 % Apply 1 application topically 2 (two) times daily. Reported on 11/14/2015    Historical Provider, MD  nitroGLYCERIN (NITROSTAT) 0.4 MG SL tablet Place 1 tablet (0.4 mg total) under the tongue every 5 (five) minutes as needed for chest pain. No more than 3 in 15 minutes 02/16/16   Sherren Mocha, MD  [facility] DELICA LANCETS [id number] MISC USE TO CHECK SUGAR 4 TIMES DAILY AND AS NEEDED. 123456 **ONE TOUCH DELICA** 123456   Ria Bush, MD  oxyCODONE (OXY IR/ROXICODONE) 5 MG immediate release tablet Take 1 tablet (5 mg total) by mouth every 6 (six) hours as needed for moderate pain or severe pain. 02/28/16   Ria Bush, MD  pantoprazole (PROTONIX) 40 MG tablet TAKE 1 TABLET BY MOUTH TWICE A DAY 10/12/15   Ria Bush, MD  potassium chloride SA (K-DUR,KLOR-CON) 20 MEQ tablet Take 40 mEq by mouth 2 (two) times  daily.     Historical Provider, MD  promethazine (PHENERGAN) 25 MG tablet Take 25 mg by mouth 2 (two) times daily as needed for nausea or vomiting.    Historical Provider, MD  QUEtiapine (SEROQUEL) 200 MG tablet TAKE 1 TABLET BY MOUTH AT BEDTIME 12/22/15   Ria Bush, MD  rifaximin (XIFAXAN) 550 MG TABS tablet Take 550 mg by mouth 2 (two) times daily. Reported on 10/17/2015    Historical Provider, MD  rOPINIRole (REQUIP) 4 MG tablet TAKE 1 TABLET BY MOUTH AT BEDTIME 02/12/16   Ria Bush, MD  simvastatin (ZOCOR) 40 MG tablet Take 40 mg by mouth daily.    Historical Provider, MD  sodium chloride (V-R NASAL SPRAY SALINE) 0.65 % nasal spray Place 1 spray into both nostrils as needed.    Historical Provider, MD  sulfamethoxazole-trimethoprim (BACTRIM DS,SEPTRA DS) 800-160 MG tablet Take 1 tablet by mouth 2 (two) times daily. 02/26/16   Venia Carbon, MD  torsemide (DEMADEX) 20 MG tablet Take 40 mg by mouth daily.    Historical Provider, MD  Vitamin D, Ergocalciferol, (DRISDOL) 50000 units CAPS capsule Take 50,000 Units by mouth once a week. On Fridays 01/29/16   Historical Provider, MD    Physical Exam: Filed Vitals:   03/01/16 1230 03/01/16 1245 03/01/16 1300 03/01/16 1330  BP: 135/41  121/56 136/53  Pulse: 70 65 64 65  Resp: 17 25 16 20   SpO2: 97% 97% 96% 99%     General:  Appears Anxious and slightly uncomfortable, obese Eyes:  PERRL, EOMI, normal lids, iris ENT:  grossly normal hearing, lips & tongue, mucous membranes of her mouth are slightly pale somewhat dry Neck:  no LAD, masses or thyromegaly Cardiovascular:  Sounds distant but regular, no m/r/g. No LE edema.  Respiratory:  Mild increased work of breathing with conversation. Breath sounds with good flow mild end expiratory wheeze, no crackles Abdomen:  soft, ntnd, obese positive bowel sounds throughout no guarding or rebounding Skin:  no rash or induration seen on limited exam Musculoskeletal:  grossly normal tone BUE/BLE,  good ROM, no bony abnormality Psychiatric:  grossly normal mood and affect, speech fluent and appropriate, AOx3 Neurologic:  CN 2-12 grossly intact, moves all extremities in coordinated fashion, sensation intact  Labs on Admission: I have personally reviewed following labs and imaging studies  CBC:  Recent Labs Lab 02/28/16 1210 03/01/16 1002  WBC  --  3.8*  HGB 8.3* 8.0*  HCT  --  24.5*  MCV  --  97.2  PLT  --  90*   Basic Metabolic Panel:  Recent Labs Lab 03/01/16 1002  NA 140  K 3.8  CL 108  CO2 24  GLUCOSE 126*  BUN 47*  CREATININE 1.61*  CALCIUM 8.9   GFR: Estimated Creatinine Clearance: 34 mL/min (by C-G formula based on Cr of 1.61). Liver Function Tests:  Recent Labs Lab 03/01/16 1002  AST 39  ALT 29  ALKPHOS 119  BILITOT 1.3*  PROT 5.7*  ALBUMIN 3.0*   No results for input(s): LIPASE, AMYLASE in the last 168 hours. No results for input(s): AMMONIA in the last 168 hours. Coagulation Profile:  Recent Labs Lab 03/01/16 1002  INR 1.21   Cardiac Enzymes: No results for input(s): CKTOTAL, CKMB, CKMBINDEX, TROPONINI in the last 168 hours. BNP (last 3 results)  Recent Labs  11/13/15 1628 11/21/15 1335  PROBNP 82.0 72.0   HbA1C: No results for input(s): HGBA1C in the last 72 hours. CBG:  Recent Labs Lab 03/01/16 0944  GLUCAP 124*   Lipid Profile: No results for input(s): CHOL, HDL, LDLCALC, TRIG, CHOLHDL, LDLDIRECT in the last 72 hours. Thyroid Function Tests: No results for input(s): TSH, T4TOTAL, FREET4, T3FREE, THYROIDAB in the last 72 hours. Anemia Panel:  Recent Labs  03/01/16 1240  RETICCTPCT 5.1*   Urine analysis:    Component Value Date/Time   COLORURINE YELLOW 03/01/2016 [place] 03/01/2016 1207   LABSPEC 1.020 03/01/2016 1207   PHURINE 5.5 03/01/2016 [place] 03/01/2016 [place] 07/17/2015 0907   HGBUR NEGATIVE 03/01/2016 [place] 03/01/2016 1207     BILIRUBINUR negative 02/26/2016 [place] 03/01/2016 1207   PROTEINUR NEGATIVE 03/01/2016 1207   PROTEINUR negative 02/26/2016 1206   UROBILINOGEN 4.0 02/26/2016 1206   UROBILINOGEN 0.2 07/17/2015 0907   NITRITE NEGATIVE 03/01/2016 1207   NITRITE negative 02/26/2016 1206   LEUKOCYTESUR NEGATIVE 03/01/2016 1207    Creatinine Clearance: Estimated Creatinine Clearance: 34 mL/min (by C-G formula based on Cr of 1.61).  Sepsis Labs: @LABRCNTIP (procalcitonin:4,lacticidven:4) )No results found for this or any previous visit (from the past 240 hour(s)).   Radiological Exams on Admission: Dg Chest 2 View  03/01/2016  CLINICAL DATA:  Shortness breath for 3 weeks. EXAM: CHEST  2 VIEW COMPARISON:  Chest x-rays dated 12/11/2015 and 08/24/2015. FINDINGS: Cardiomediastinal silhouette is stable in size and configuration. Mild cardiomegaly is unchanged. Atherosclerotic changes again noted at the aortic arch. Mild interstitial prominence appears stable, presumably chronic interstitial lung disease. No confluent opacity to suggest a developing pneumonia. No pleural effusion or pneumothorax seen. Mild degenerative spurring noted within the thoracic  spine. No acute appearing osseous abnormality. IMPRESSION: No active cardiopulmonary disease. No evidence of pneumonia. No evidence of CHF. Probable chronic interstitial lung disease, mild. Stable cardiomegaly. Aortic atherosclerosis. Electronically Signed   By: Franki Cabot M.D.   On: 03/01/2016 11:15    EKG: Independently reviewed.Sinus or ectopic atrial rhythm Abnormal R-wave progression, late transition Left ventricular hypertrophy No significant change since last tracing  Assessment/Plan Principal Problem:   Symptomatic anemia Active Problems:   Essential hypertension   Liver cirrhosis secondary to NASH   COPD with chronic bronchitis (HCC)   Chronic diastolic CHF (congestive heart failure) (HCC)   Acute renal failure (ARF) (HCC)   DOE  (dyspnea on exertion)   Diabetes ([place])    #1. Symptomatic anemia. Hemoglobin 8.0 on admission. Chart review indicates hemoglobin 9.72 months ago and been trending downwards since that time. Appeared to stabilize around 8.5-9.0 range. Review indicates chronic bleed related to portal hypertensive gastropathy and GAVE syndrome -Admit to telemetry -Obtain FOBT -Follow anemia panel -Transfuse 1 unit of packed RBCs -We'll likely need outpatient GI follow-up -Recheck hemoglobin in the a.m.  #2. Dyspnea on exertion. On 2 L oxygen at home. Likely related to #1 in setting of possible progression of COPD. Chest x-ray with no active cardiopulmonary process. No signs of CHF no signs of pneumonia. Interstitial lung disease.  BNP within the limits of normal. Troponin negative -Oxygen supplementation and maintain oxygen saturation level 90% or greater -Continue home inhalers -Transfuse as noted above -Monitor  #3. Acute renal failure. Creatinine 1.6 on admission. -Hold nephrotoxins -Gentle fluids -Monitor urine output -Recheck in the morning  #4. Chronic diastolic heart failure.  Echo done in March of this year reveals an EF of 60-65% and grade 2 diastolic dysfunction. Home medications include Zaroxolyn and demadex -hold demadex and zaroxolyn -Daily weights -Intake and output  #5. COPD. On 2 L of oxygen at home. Appears stable. Chart review indicates pulmonology visit last month. Note indicates mild obstructive and moderate restrictive lung disease. ENT showed slight deterioration in FEV1 according to note. Note recommended continuation of Advair nebs oxygen Mucinex, weight loss, exercise, anxiety management -Continue home nebulizers -Oxygen supplementation as noted above -Wean oxygen back to 2 L stable -Xanax per home regimen  #6. Diabetes. Home medications include Lantus 120 units every a.m. Serum glucose 126 on admission -Continue Lantus at lower dose -Sliding scale -Hemoglobin A1c  #7.  Liver cirrhosis secondary to [place] stable. Chart review indicates followed by Dr. Olevia Perches -continue rifaxim -continue lactulose        DVT prophylaxis: scd  Code Status:  DNR Family Communication: daughter at bedside  Disposition Plan: home  Consults called: none  Admission status: obs    Radene Gunning MD [facility]  If 7PM-7AM, please contact night-coverage [email or web address] Password [email or web address]  03/01/2016, 1:51 PM

## 2016-03-01 NOTE — Telephone Encounter (Signed)
There was no answer at pts contact #. Per chart review tab pt is at [facility] ED.

## 2016-03-01 NOTE — Telephone Encounter (Signed)
Note mentions no severe breathing or swallowing difficulty. I don't think she needs ER evaluation via EMS - if not acutely dyspneic would offer appt in office today if she hasn't called 911 yet.

## 2016-03-01 NOTE — ED Notes (Signed)
Pt tranferred to bedside commode with assist, got winded.

## 2016-03-01 NOTE — ED Notes (Addendum)
Hemoccult stool negative per [facility]

## 2016-03-01 NOTE — ED Notes (Signed)
Pt in from home via [facility] EMS with c/o sob since last week, worsened since last night. Pt wears 2L home O2, sats were 89% when EMS arrived. Pt appears anxious, took only insulin this morning. A&Ox3, complaining of "PNA pain". Pt reports having 100.1 temp yesterday.

## 2016-03-01 NOTE — Telephone Encounter (Signed)
Pt called to let you know that she is at [place] now

## 2016-03-01 NOTE — ED Provider Notes (Signed)
CSN: [id number]     Arrival date & time 03/01/16  [id number] History   First MD Initiated Contact with Patient 03/01/16 [id number]     Chief Complaint  Patient presents with  . Shortness of Breath   HPI Patient presents to the emergency room for evaluation of shortness of breath. Patient has history of multiple medical problems including COPD, anxiety, nonobstructive coronary artery disease who states that this morning she woke up and she was feeling very short of breath. She tried to walk to the bathroom to wash up and she became very weak and dyspneic. She has a history of COPD and is normally on 2 L of nasal cannula oxygen but she does not think this is enough. Patient has been coughing. She has had low-grade temperatures up to 100.1 daily for at least the last several weeks. Denies any vomiting or diarrhea.  Patient has seen her primary doctor recently for these symptoms. She's had some questionable urinary symptoms. She was seen in the office on the third and started on antibiotics. Past Medical History  Diagnosis Date  . Chronic airway obstruction, not elsewhere classified   . Unspecified chronic bronchitis ([place])   . Unspecified essential hypertension   . Non-obstructive CAD   . Palpitations   . Mild aortic stenosis     a. 03/2014 Valve area (VTI): 2.89 cm^2, Valve area (Vmax): 2.7 cm^2.  . Pure hypercholesterolemia   . Morbid obesity ([place])   . Esophageal reflux   . GAVE (gastric antral vascular ectasia)     angiodysplasia; s/p multiple APCs, discussing RFA with [facility] GI Dr Newman Pies (06/2015)  . Diverticulosis of colon (without mention of hemorrhage)   . Benign neoplasm of colon   . Osteoarthrosis, unspecified whether generalized or localized, unspecified site   . Osteoporosis, unspecified   . Restless legs syndrome (RLS)   . Major depressive disorder, recurrent episode, severe, specified as with psychotic behavior   . Iron deficiency anemia secondary to blood loss (chronic)     a.  frequent PRBC transfusions.  . Coarse tremors     a. arms.  . Carpal tunnel syndrome on both sides   . Chronic diastolic CHF (congestive heart failure) ([place])     a. 03/26/2014 Echo: EF 55-60%, no rwma, mild AS/AI, mod-sev Ca2+ MV annulus, mildly to mod dil LA.  [name] Asthma   . Type II diabetes mellitus ([place])   . H/O hiatal hernia   . 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 dx'd 1990's  . Adenomatous colon polyp   . Midsternal chest pain     a. conservatively managed ->poor candidate for cath/anticoagulation.  [name] Anxiety   . Portal hypertensive gastropathy   . Falls frequently     completed HHPT/OT 06/2014, PT unmet goals  . Recurrent UTI (urinary tract infection)     h/o hospitalization with urosepsis 2015, but thought large component colonization/bacteriuria, only treat if symptomatic ([place])  . Hiatal hernia   . Thrombocytopenia ([place])   . Protein calorie malnutrition ([place])   . On home oxygen therapy     "3L; 24/7" (11/29/2015)  . OSA (obstructive sleep apnea)     a. does not use CPAP. (01/25/2015)  . Hepatitis   . CAP (community acquired pneumonia) 2015   Past Surgical History  Procedure Laterality Date  . Hemorroidectomy    . Appendectomy    . Vaginal hysterectomy    . Breast biopsy      bilateral  .  Cesarean section      x 3  . Appendectomy    . Esophagogastroduodenoscopy  06/12/2012    Procedure: ESOPHAGOGASTRODUODENOSCOPY (EGD);  Surgeon: Lafayette Dragon, MD;  Location: [facility];  Service: Endoscopy;  Laterality: N/A;  . Balloon dilation  06/12/2012    Procedure: BALLOON DILATION;  Surgeon: Lafayette Dragon, MD;  Location: [facility] ENDOSCOPY;  Service: Endoscopy;  Laterality: N/A;  ?balloon  . Esophagogastroduodenoscopy  09/10/2012    Procedure: ESOPHAGOGASTRODUODENOSCOPY (EGD);  Surgeon: Lafayette Dragon, MD;  Location: [facility];  Service: Endoscopy;  Laterality: N/A;  . Hot hemostasis  09/10/2012    Procedure: HOT HEMOSTASIS (ARGON PLASMA  COAGULATION/BICAP);  Surgeon: Lafayette Dragon, MD;  Location: [facility];  Service: Endoscopy;  Laterality: N/A;  . Esophagogastroduodenoscopy N/A 01/18/2013    Procedure: ESOPHAGOGASTRODUODENOSCOPY (EGD);  Surgeon: Lafayette Dragon, MD;  Location: [facility] ENDOSCOPY;  Service: Endoscopy;  Laterality: N/A;  . Esophagogastroduodenoscopy N/A 05/24/2013    Procedure: ESOPHAGOGASTRODUODENOSCOPY (EGD);  Surgeon: Jerene Bears, MD;  Location: [place];  Service: Gastroenterology;  Laterality: N/A;  . Hot hemostasis N/A 05/24/2013    Procedure: HOT HEMOSTASIS (ARGON PLASMA COAGULATION/BICAP);  Surgeon: Jerene Bears, MD;  Location: [place];  Service: Gastroenterology;  Laterality: N/A;  . US echocardiography  07/2014    mild LVH, EF 60-65%, normal wall motion, mild AR, mod dilated LA, mildly dilated RA, peak PA pressure 83mmHg  . Esophagogastroduodenoscopy N/A 10/20/2014    Procedure: ESOPHAGOGASTRODUODENOSCOPY (EGD);  Surgeon: Lafayette Dragon, MD;  Location: [facility];  Service: Endoscopy;  Laterality: N/A;  . Hot hemostasis N/A 10/20/2014    Procedure: HOT HEMOSTASIS (ARGON PLASMA COAGULATION/BICAP);  Surgeon: Lafayette Dragon, MD;  Location: [facility];  Service: Endoscopy;  Laterality: N/A;  . Enteroscopy N/A 02/25/2015    Procedure: ENTEROSCOPY;  Surgeon: [name] Ada, MD;  Location: [place];  Service: Endoscopy;  Laterality: N/A;  . Enteroscopy N/A 06/27/2015    Procedure: ENTEROSCOPY;  Surgeon: Manus Gunning, MD;  Location: [facility];  Service: Gastroenterology;  Laterality: N/A;  . Eus  09/2015    antral CAVL, ablated; nodular erosive gastritis; mult pancreatic cysts rec rpt CT pancreas protocol or MRI to survey pancreas cysts 1-2 yrs (Dr Amie Critchley)   Family History  Problem Relation Age of Onset  . Heart disease Mother   . Cervical cancer Mother   . Kidney disease Mother   . Diabetes Mother   . Breast cancer Sister   . Multiple sclerosis Sister   . Colon cancer Neg Hx   . Breast  cancer Maternal Aunt     x 2  . Diabetes Father   . Diabetes Sister   . Multiple sclerosis Other   . Heart attack Mother   . Heart attack Father   . Stroke Daughter   . Stroke Daughter    Social History  Substance Use Topics  . Smoking status: Former Smoker -- 1.50 packs/day for 25 years    Types: Cigarettes    Quit date: 08/26/1992  . Smokeless tobacco: Never Used  . Alcohol Use: No     Comment: [id number] "last alcoholic drink was years ago"   OB History    No data available     Review of Systems  All other systems reviewed and are negative.     Allergies  Acetaminophen; Nsaids; Aspirin; Theophylline; Penicillin g; and Penicillins  Home Medications   Prior to Admission medications   Medication Sig Start Date End Date Taking? Authorizing  Provider  ADVAIR DISKUS 250-50 MCG/DOSE AEPB INHALE 1 PUFF INTO LUNGS TWICE A DAY 05/29/15   Ria Bush, MD  albuterol (PROAIR HFA) 108 (90 BASE) MCG/ACT inhaler Inhale 2 puffs into the lungs every 6 (six) hours as needed for wheezing or shortness of breath. Reported on 08/18/2015    Historical Provider, MD  ALPRAZolam Duanne Moron) 1 MG tablet TAKE 1/2 TO 1 TABLET TWICE A DAY AS NEEDED FOR ANXIETY AND SLEEP 02/27/16   Ria Bush, MD  B Complex-C ([facility] B COMPLEX PLUS C) TABS TAKE ONE CAPSULE BY MOUTH EVERY DAY 11/16/15   Ria Bush, MD  buPROPion (WELLBUTRIN XL) 150 MG 24 hr tablet Take 150 mg by mouth daily.    Historical Provider, MD  desvenlafaxine (PRISTIQ) 50 MG 24 hr tablet Take 3 tablets (150 mg total) by mouth daily. 05/12/15   Ria Bush, MD  diclofenac sodium (VOLTAREN) 1 % GEL Apply 1 application topically 3 (three) times daily. 01/11/16   Ria Bush, MD  ferrous sulfate 325 (65 FE) MG tablet TAKE 1 TABLET BY MOUTH EVERY DAY WITH BREAKFAST 01/08/16   Manus Gunning, MD  glucose blood (ONE TOUCH ULTRA TEST) test strip Check blood sugar 4 times daily 10/16/15   Ria Bush, MD  guaiFENesin (MUCINEX) 600  MG 12 hr tablet Take 600 mg by mouth 2 (two) times daily.     Historical Provider, MD  guaiFENesin-codeine 100-10 MG/5ML syrup Take 5 mLs by mouth 2 (two) times daily as needed. cough 12/13/15   Historical Provider, MD  Insulin Glargine (LANTUS SOLOSTAR) 100 UNIT/ML Solostar Pen Inject 120 Units into the skin every morning. 04/13/15   Renato Shin, MD  insulin lispro (HUMALOG KWIKPEN) 100 UNIT/ML KiwkPen Inject 5 Units into the skin 3 (three) times daily with meals. Take only if blood sugar is over 300    Historical Provider, MD  Insulin Pen Needle (B-D ULTRAFINE III SHORT PEN) 31G X 8 MM MISC Use as directed to inject insulin twice daily. Dx: E11.8 10/13/15   Renato Shin, MD  isosorbide mononitrate (IMDUR) 30 MG 24 hr tablet Take 0.5 tablets (15 mg total) by mouth daily. 12/19/15   Liliane Shi, PA-C  lactulose, encephalopathy, (CHRONULAC) 10 GM/15ML SOLN Take 20 g by mouth 3 (three) times daily.  11/21/15   Ria Bush, MD  levalbuterol Penne Lash) 0.63 MG/3ML nebulizer solution Take 0.63 mg by nebulization every 8 (eight) hours as needed for wheezing or shortness of breath.  06/05/14   Historical Provider, MD  loratadine (CLARITIN) 10 MG tablet TAKE 1 TABLET BY MOUTH EVERY DAY 07/14/15   Ria Bush, MD  metolazone (ZAROXOLYN) 2.5 MG tablet TAKE AS DIRECTED FOR 4 LB WEIGHT GAIN 01/01/16   Sherren Mocha, MD  Multiple Vitamins-Minerals (CENTRUM SILVER PO) Take 1 tablet by mouth daily.     Historical Provider, MD  mupirocin cream (BACTROBAN) 2 % Apply 1 application topically 2 (two) times daily. Reported on 11/14/2015    Historical Provider, MD  nitroGLYCERIN (NITROSTAT) 0.4 MG SL tablet Place 1 tablet (0.4 mg total) under the tongue every 5 (five) minutes as needed for chest pain. No more than 3 in 15 minutes 02/16/16   Sherren Mocha, MD  [facility] DELICA LANCETS [id number] MISC USE TO CHECK SUGAR 4 TIMES DAILY AND AS NEEDED. 123456 **ONE TOUCH DELICA** 123456   Ria Bush, MD  oxyCODONE (OXY  IR/ROXICODONE) 5 MG immediate release tablet Take 1 tablet (5 mg total) by mouth every 6 (six) hours as needed for moderate  pain or severe pain. 02/28/16   Ria Bush, MD  pantoprazole (PROTONIX) 40 MG tablet TAKE 1 TABLET BY MOUTH TWICE A DAY 10/12/15   Ria Bush, MD  potassium chloride SA (K-DUR,KLOR-CON) 20 MEQ tablet Take 40 mEq by mouth 2 (two) times daily.     Historical Provider, MD  promethazine (PHENERGAN) 25 MG tablet Take 25 mg by mouth 2 (two) times daily as needed for nausea or vomiting.    Historical Provider, MD  QUEtiapine (SEROQUEL) 200 MG tablet TAKE 1 TABLET BY MOUTH AT BEDTIME 12/22/15   Ria Bush, MD  rifaximin (XIFAXAN) 550 MG TABS tablet Take 550 mg by mouth 2 (two) times daily. Reported on 10/17/2015    Historical Provider, MD  rOPINIRole (REQUIP) 4 MG tablet TAKE 1 TABLET BY MOUTH AT BEDTIME 02/12/16   Ria Bush, MD  simvastatin (ZOCOR) 40 MG tablet Take 40 mg by mouth daily.    Historical Provider, MD  sodium chloride (V-R NASAL SPRAY SALINE) 0.65 % nasal spray Place 1 spray into both nostrils as needed.    Historical Provider, MD  sulfamethoxazole-trimethoprim (BACTRIM DS,SEPTRA DS) 800-160 MG tablet Take 1 tablet by mouth 2 (two) times daily. 02/26/16   Venia Carbon, MD  torsemide (DEMADEX) 20 MG tablet Take 40 mg by mouth daily.    Historical Provider, MD  Vitamin D, Ergocalciferol, (DRISDOL) 50000 units CAPS capsule Take 50,000 Units by mouth once a week. On Fridays 01/29/16   Historical Provider, MD   BP 129/63 mmHg  Pulse 69  Resp 13  SpO2 96% Physical Exam  Constitutional: She appears well-nourished. No distress.  Obese  HENT:  Head: Normocephalic and atraumatic.  Right Ear: External ear normal.  Left Ear: External ear normal.  Mucous membranes dry  Eyes: Conjunctivae are normal. Right eye exhibits no discharge. Left eye exhibits no discharge. No scleral icterus.  Neck: Neck supple. No tracheal deviation present.  Cardiovascular: Normal  rate, regular rhythm and intact distal pulses.   Pulmonary/Chest: Effort normal and breath sounds normal. No stridor. No respiratory distress. She has no wheezes. She has no rales.  Abdominal: Soft. Bowel sounds are normal. She exhibits no distension. There is no tenderness. There is no rebound and no guarding.  Musculoskeletal: She exhibits no edema or tenderness.  Neurological: She is alert. No cranial nerve deficit (no facial droop, extraocular movements intact, no slurred speech) or sensory deficit. She exhibits normal muscle tone. She displays no seizure activity. Coordination normal.  Generalized weakness, no focal deficit  Skin: Skin is warm and dry. No rash noted.  Psychiatric: She has a normal mood and affect.  Nursing note and vitals reviewed.   ED Course  Procedures (including critical care time) Labs Review Labs Reviewed  CBC - Abnormal; Notable for the following:    WBC 3.8 (*)    RBC 2.52 (*)    Hemoglobin 8.0 (*)    HCT 24.5 (*)    RDW 17.0 (*)    Platelets 90 (*)    All other components within normal limits  COMPREHENSIVE METABOLIC PANEL - Abnormal; Notable for the following:    Glucose, Bld 126 (*)    BUN 47 (*)    Creatinine, Ser 1.61 (*)    Total Protein 5.7 (*)    Albumin 3.0 (*)    Total Bilirubin 1.3 (*)    GFR calc non Af Amer 31 (*)    GFR calc Af Amer 36 (*)    All other components within normal limits  PROTIME-INR - Abnormal; Notable for the following:    Prothrombin Time 15.5 (*)    All other components within normal limits  CBG MONITORING, ED - Abnormal; Notable for the following:    Glucose-Capillary 124 (*)    All other components within normal limits  BRAIN NATRIURETIC PEPTIDE  URINALYSIS, ROUTINE W REFLEX MICROSCOPIC (NOT AT [facility])  VITAMIN B12  FOLATE  IRON AND TIBC  FERRITIN  RETICULOCYTES  I-STAT TROPOININ, ED  POC OCCULT BLOOD, ED  TYPE AND SCREEN    Imaging Review Dg Chest 2 View  03/01/2016  CLINICAL DATA:  Shortness breath for  3 weeks. EXAM: CHEST  2 VIEW COMPARISON:  Chest x-rays dated 12/11/2015 and 08/24/2015. FINDINGS: Cardiomediastinal silhouette is stable in size and configuration. Mild cardiomegaly is unchanged. Atherosclerotic changes again noted at the aortic arch. Mild interstitial prominence appears stable, presumably chronic interstitial lung disease. No confluent opacity to suggest a developing pneumonia. No pleural effusion or pneumothorax seen. Mild degenerative spurring noted within the thoracic spine. No acute appearing osseous abnormality. IMPRESSION: No active cardiopulmonary disease. No evidence of pneumonia. No evidence of CHF. Probable chronic interstitial lung disease, mild. Stable cardiomegaly. Aortic atherosclerosis. Electronically Signed   By: Franki Cabot M.D.   On: 03/01/2016 11:15   I have personally reviewed and evaluated these images and lab results as part of my medical decision-making.   EKG Interpretation   Date/Time:  Friday March 01 2016 09:35:00 EDT Ventricular Rate:  76 PR Interval:    QRS Duration: 106 QT Interval:  407 QTC Calculation: 458 R Axis:   -36 Text Interpretation:  Sinus or ectopic atrial rhythm Abnormal R-wave  progression, late transition Left ventricular hypertrophy No significant  change since last tracing Confirmed by Marche Hottenstein  MD-J, Berkley Wrightsman [id number]) on  03/01/2016 10:03:25 AM      MDM   Final diagnoses:  Dyspnea  Anemia, unspecified anemia type    Pt appears comfortable in the ED.  Eating a meal.  She is concerned about her dyspnea on exertion.  Pt wonders if she needs a blood transfusion.   She has history of chronic anemia.  She states Dr Olevia Perches use to give her tranfusions at this level.  Denies any recent blood loss.  Lab tests do show progressive worsening of her anemia.  She also does have increased o2 requirement.  Normally on 2l.  On 3 l in the ED.  Pt symptomatic when walking.  Doutb CHF or PNA.   No acute cardiac ischemia but could be a contributing  factor.  Will consult with medical service.  Consider admission, observation, blood transfusion and reassessment.    Dorie Rank, MD 03/01/16 1230

## 2016-03-01 NOTE — ED Notes (Signed)
CBG 124 

## 2016-03-02 DIAGNOSIS — N179 Acute kidney failure, unspecified: Secondary | ICD-10-CM | POA: Diagnosis not present

## 2016-03-02 DIAGNOSIS — E1122 Type 2 diabetes mellitus with diabetic chronic kidney disease: Secondary | ICD-10-CM

## 2016-03-02 DIAGNOSIS — J449 Chronic obstructive pulmonary disease, unspecified: Secondary | ICD-10-CM | POA: Diagnosis not present

## 2016-03-02 DIAGNOSIS — D649 Anemia, unspecified: Secondary | ICD-10-CM

## 2016-03-02 DIAGNOSIS — N183 Chronic kidney disease, stage 3 (moderate): Secondary | ICD-10-CM

## 2016-03-02 DIAGNOSIS — I5032 Chronic diastolic (congestive) heart failure: Secondary | ICD-10-CM

## 2016-03-02 DIAGNOSIS — Z794 Long term (current) use of insulin: Secondary | ICD-10-CM

## 2016-03-02 LAB — HEMOGLOBIN AND HEMATOCRIT, BLOOD
HCT: 29.1 % — ABNORMAL LOW (ref 36.0–46.0)
Hemoglobin: 9 g/dL — ABNORMAL LOW (ref 12.0–15.0)

## 2016-03-02 LAB — CBC
HEMATOCRIT: 26.5 % — AB (ref 36.0–46.0)
Hemoglobin: 8.2 g/dL — ABNORMAL LOW (ref 12.0–15.0)
MCH: 30.7 pg (ref 26.0–34.0)
MCHC: 30.9 g/dL (ref 30.0–36.0)
MCV: 99.3 fL (ref 78.0–100.0)
PLATELETS: 90 10*3/uL — AB (ref 150–400)
RBC: 2.67 MIL/uL — ABNORMAL LOW (ref 3.87–5.11)
RDW: 17.9 % — AB (ref 11.5–15.5)
WBC: 3.6 10*3/uL — AB (ref 4.0–10.5)

## 2016-03-02 LAB — BASIC METABOLIC PANEL
ANION GAP: 6 (ref 5–15)
BUN: 42 mg/dL — AB (ref 6–20)
CHLORIDE: 109 mmol/L (ref 101–111)
CO2: 27 mmol/L (ref 22–32)
Calcium: 8.8 mg/dL — ABNORMAL LOW (ref 8.9–10.3)
Creatinine, Ser: 1.59 mg/dL — ABNORMAL HIGH (ref 0.44–1.00)
GFR calc Af Amer: 36 mL/min — ABNORMAL LOW (ref >60)
GFR calc non Af Amer: 31 mL/min — ABNORMAL LOW (ref >60)
GLUCOSE: 202 mg/dL — AB (ref 65–99)
POTASSIUM: 3.8 mmol/L (ref 3.5–5.1)
Sodium: 142 mmol/L (ref 135–145)

## 2016-03-02 LAB — GLUCOSE, CAPILLARY
Glucose-Capillary: 159 mg/dL — ABNORMAL HIGH (ref 65–99)
Glucose-Capillary: 286 mg/dL — ABNORMAL HIGH (ref 65–99)
Glucose-Capillary: 179 mg/dL — ABNORMAL HIGH (ref 65–99)
Glucose-Capillary: 226 mg/dL — ABNORMAL HIGH (ref 65–99)

## 2016-03-02 LAB — HEMOGLOBIN A1C
Hgb A1c MFr Bld: 5.6 % (ref 4.8–5.6)
Mean Plasma Glucose: 114 mg/dL

## 2016-03-02 LAB — PREPARE RBC (CROSSMATCH)

## 2016-03-02 MED ORDER — IPRATROPIUM-ALBUTEROL 0.5-2.5 (3) MG/3ML IN SOLN: 3.0000 mL | RESPIRATORY_TRACT | Status: DC

## 2016-03-02 MED ORDER — INSULIN GLARGINE 100 UNIT/ML ~~LOC~~ SOLN
30.0000 [IU] | Freq: Every morning | SUBCUTANEOUS | Status: DC
  Administered 2016-03-02: 30 [IU] via SUBCUTANEOUS
  Filled 2016-03-02 (×2): qty 0.3

## 2016-03-02 MED ORDER — NYSTATIN 100000 UNIT/ML MT SUSP: 5.0000 mL | Freq: Four times a day (QID) | OROMUCOSAL | Status: DC

## 2016-03-02 MED ORDER — SODIUM CHLORIDE 0.9 % IV SOLN
Freq: Once | INTRAVENOUS | Status: AC
  Administered 2016-03-02: 11:00:00 via INTRAVENOUS

## 2016-03-02 NOTE — Progress Notes (Signed)
[facility]                                                                              Patient Demographics  [name], is a 75 y.o. female, DOB - 12/03/1940, [id number]  Admit date - 03/01/2016   Admitting Physician Bonnell Public, MD  Outpatient Primary MD for the patient is Ria Bush, MD  Outpatient specialists:   LOS -   days    Chief Complaint  Patient presents with  . Shortness of Breath       Brief summary   [name] is a nervous, anxious 75 y.o. female with medical history significant for diabetes, chronic respiratory failure on 2 L oxygen, COPD, CHF, cirrhosis, hypertension, iron deficiency anemia since to the emergency department with the chief complaint of dyspnea on exertion. Initial evaluation concerning for symptomatic anemia.She reports being in her usual state of health until the day of admission, she awakened walk to the bathroom and became very short of breath and "weak all over". Associated symptoms include intermittent cough with intermittent sputum production. She denied chest pain palpitations headache dizziness syncope or near-syncope. She denied lower extremity edema or orthopnea. She denied any abdominal pain nausea vomiting diarrhea constipation. She denied dysuria hematuria frequency or urgency. She denied melena or bright red blood per rectum. She did report some chills and subjective fever.   Assessment & Plan    Principal Problem:  Symptomatic anemia. Hemoglobin 8.0 on admission.  - Per patient she has chronic anemia and has received blood transfusions in the past and procrit injection this week on 7/5. Baseline around 8.5-9. 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 On 2 L oxygen at home, underlying COPD..  - Likely related to #1 in setting of possible progression of COPD. Chest x-ray with no active cardiopulmonary process. No signs of CHF no signs of pneumonia. Interstitial lung disease. BNP within the limits of normal. Troponin negative - Continue nebs, transfuse as #1 - Wean O2 as tolerated   Acute kidney injury : Creatinine 1.6 on admission. -Hold nephrotoxins, improving   Chronic diastolic heart failure. Echo done in March of this year reveals an EF of 60-65% and grade 2 diastolic dysfunction. Home medications include Zaroxolyn and demadex -hold demadex and zaroxolyn - Monitor Intake and output   Diabetes. Home medications include Lantus 120 units every a.m. Serum glucose 126 on admission, down to 67 in the evening -Continue Lantus at lower dose -Sliding scale -Hemoglobin A1c   Liver cirrhosis secondary to [place] stable. Chart review indicates followed by Dr. Olevia Perches -continue rifaxim -continue lactulose  Anxiety - Per patient, husband at [facility], has private caregivers at home - Continue Xanax as needed, Seroquel  Code Status: dnr DVT Prophylaxis:   SCD's Family Communication: Discussed in detail with the patient, all imaging results, lab results explained to the patient  Disposition Plan: Likely DC home in a.m. if H&H is stable, PT evaluation  Time Spent in minutes   25 minutes  Procedures:  None   Consultants:   None   Antimicrobials:      Medications  Scheduled Meds: . sodium chloride   Intravenous Once  . albuterol  2.5 mg Nebulization TID  . buPROPion  150 mg Oral Daily  . ferrous sulfate  325 mg Oral Q breakfast  . guaiFENesin  600 mg Oral BID  . insulin aspart  0-5 Units Subcutaneous QHS  . insulin aspart  0-9 Units Subcutaneous TID WC  . [START ON 03/03/2016] insulin glargine  30 Units Subcutaneous q morning - 10a  . isosorbide mononitrate   15 mg Oral Daily  . lactulose  20 g Oral TID  . mometasone-formoterol  2 puff Inhalation BID  . multivitamin with minerals  1 tablet Oral Daily  . nystatin  5 mL Oral QID  . pantoprazole  40 mg Oral BID  . QUEtiapine  200 mg Oral QHS  . rifaximin  550 mg Oral BID  . rOPINIRole  4 mg Oral QHS  . simvastatin  40 mg Oral q1800  . sodium chloride flush  3 mL Intravenous Q12H  . venlafaxine XR  37.5 mg Oral Q breakfast   Continuous Infusions:  PRN Meds:.albuterol, ALPRAZolam, nitroGLYCERIN, ondansetron **OR** ondansetron (ZOFRAN) IV, oxyCODONE   Antibiotics   Anti-infectives    Start     Dose/Rate Route Frequency Ordered Stop   03/01/16 1315  rifaximin (XIFAXAN) tablet 550 mg     550 mg Oral 2 times daily 03/01/16 1312          Subjective:   [name] was seen and examined today. Still feels tired and fatigued, "not too well, i am anemic". Asking for another blood transfusion. Patient denies dizziness, abdominal pain, N/V/D/C, new weakness, numbess, tingling. No acute events overnight.    Objective:   Filed Vitals:   03/02/16 0417 03/02/16 0722 03/02/16 1108 03/02/16 1132  BP: 112/44  137/41 126/36  Pulse: 58  59 54  Temp: 98.2 F (36.8 C)  99 F (37.2 C) 97.4 F (36.3 C)  TempSrc: Oral  Oral Oral  Resp: 18  18 18   Height:      Weight:      SpO2: 100% 97% 98% 98%    Intake/Output Summary (Last 24 hours) at 03/02/16 1155 Last data filed at 03/02/16 0900  Gross per 24 hour  Intake   1045 ml  Output    400 ml  Net    645 ml     Wt Readings from Last 3 Encounters:  03/02/16 96.026 kg (211 lb 11.2 oz)  02/26/16 93.441 kg (206 lb)  02/19/16 94.892 kg (209 lb 3.2 oz)     Exam  General: Alert and oriented x 3, NAD, Anxious  HEENT:  PERRLA, EOMI, Anicteric Sclera, mucous membranes moist.   Neck: Supple, no JVD, no masses  Cardiovascular: S1 S2 auscultated, no rubs, murmurs or gallops. Regular rate and rhythm.  Respiratory: Mild expiratory  wheezing  Gastrointestinal: Soft, nontender, nondistended, + bowel sounds  Ext: no cyanosis clubbing or edema  Neuro: AAOx3, Cr N's II- XII. Strength 5/5 upper and lower extremities bilaterally  Skin: No rashes  Psych: Normal affect and demeanor, alert and oriented x3    Data Reviewed:  I have personally reviewed following labs and imaging studies  Micro Results No results found for this or any previous visit (from  the past 240 hour(s)).  Radiology Reports Dg Chest 2 View  03/01/2016  CLINICAL DATA:  Shortness breath for 3 weeks. EXAM: CHEST  2 VIEW COMPARISON:  Chest x-rays dated 12/11/2015 and 08/24/2015. FINDINGS: Cardiomediastinal silhouette is stable in size and configuration. Mild cardiomegaly is unchanged. Atherosclerotic changes again noted at the aortic arch. Mild interstitial prominence appears stable, presumably chronic interstitial lung disease. No confluent opacity to suggest a developing pneumonia. No pleural effusion or pneumothorax seen. Mild degenerative spurring noted within the thoracic spine. No acute appearing osseous abnormality. IMPRESSION: No active cardiopulmonary disease. No evidence of pneumonia. No evidence of CHF. Probable chronic interstitial lung disease, mild. Stable cardiomegaly. Aortic atherosclerosis. Electronically Signed   By: Franki Cabot M.D.   On: 03/01/2016 11:15    Lab Data:  CBC:  Recent Labs Lab 02/28/16 1210 03/01/16 1002 03/02/16 0522  WBC  --  3.8* 3.6*  HGB 8.3* 8.0* 8.2*  HCT  --  24.5* 26.5*  MCV  --  97.2 99.3  PLT  --  90* 90*   Basic Metabolic Panel:  Recent Labs Lab 03/01/16 1002 03/02/16 0522  NA 140 142  K 3.8 3.8  CL 108 109  CO2 24 27  GLUCOSE 126* 202*  BUN 47* 42*  CREATININE 1.61* 1.59*  CALCIUM 8.9 8.8*   GFR: Estimated Creatinine Clearance: 34.6 mL/min (by C-G formula based on Cr of 1.59). Liver Function Tests:  Recent Labs Lab 03/01/16 1002  AST 39  ALT 29  ALKPHOS 119  BILITOT 1.3*  PROT  5.7*  ALBUMIN 3.0*   No results for input(s): LIPASE, AMYLASE in the last 168 hours. No results for input(s): AMMONIA in the last 168 hours. Coagulation Profile:  Recent Labs Lab 03/01/16 1002  INR 1.21   Cardiac Enzymes: No results for input(s): CKTOTAL, CKMB, CKMBINDEX, TROPONINI in the last 168 hours. BNP (last 3 results)  Recent Labs  11/13/15 1628 11/21/15 1335  PROBNP 82.0 72.0   HbA1C:  Recent Labs  03/01/16 1240  HGBA1C 5.6   CBG:  Recent Labs Lab 03/01/16 0944 03/01/16 1706 03/01/16 2236 03/02/16 0745  GLUCAP 124* 67 189* 159*   Lipid Profile: No results for input(s): CHOL, HDL, LDLCALC, TRIG, CHOLHDL, LDLDIRECT in the last 72 hours. Thyroid Function Tests: No results for input(s): TSH, T4TOTAL, FREET4, T3FREE, THYROIDAB in the last 72 hours. Anemia Panel:  Recent Labs  03/01/16 1240  VITAMINB12 2251*  FOLATE 42.3  FERRITIN 46  TIBC 342  IRON 35  RETICCTPCT 5.1*   Urine analysis:    Component Value Date/Time   COLORURINE YELLOW 03/01/2016 [place] 03/01/2016 1207   LABSPEC 1.020 03/01/2016 1207   PHURINE 5.5 03/01/2016 1207   GLUCOSEU NEGATIVE 03/01/2016 1207   GLUCOSEU NEGATIVE 07/17/2015 0907   HGBUR NEGATIVE 03/01/2016 1207   BILIRUBINUR NEGATIVE 03/01/2016 1207   BILIRUBINUR negative 02/26/2016 [place] 03/01/2016 1207   PROTEINUR NEGATIVE 03/01/2016 1207   PROTEINUR negative 02/26/2016 1206   UROBILINOGEN 4.0 02/26/2016 1206   UROBILINOGEN 0.2 07/17/2015 0907   NITRITE NEGATIVE 03/01/2016 1207   NITRITE negative 02/26/2016 1206   LEUKOCYTESUR NEGATIVE 03/01/2016 1207     RAI,RIPUDEEP M.D. [facility] 03/02/2016, 11:55 AM  Pager: [phone] Between 7am to 7pm - call Pager - [phone]  After 7pm go to [email or web address] - password [email or web address]  Call night coverage person covering after 7pm

## 2016-03-02 NOTE — Evaluation (Signed)
Physical Therapy Evaluation Patient Details Name: [name] MRN: [id number] DOB: 1941-01-07 Today's Date: 03/02/2016   History of Present Illness  [name] is a anxious 75 y.o. female with medical history significant for diabetes, chronic respiratory failure on 2 L oxygen, COPD, CHF, cirrhosis, hypertension, iron deficiency anemia since to the emergency department with the chief complaint of dyspnea on exertion. Initial evaluation concerning for symptomatic anemia.  Clinical Impression  Pt admitted with above diagnosis. Pt currently with functional limitations due to the deficits listed below (see PT Problem List). Pt becomes very SOB with all activity, in and out of bed. Tolerated only 15' ambulation 2x with seated rest in between. O2 sats 90% on 3L with ambulation, 97% at rest on 3L.  Pt will benefit from skilled PT to increase their independence and safety with mobility to allow discharge to the venue listed below.       Follow Up Recommendations SNF;Supervision/Assistance - 24 hour    Equipment Recommendations  None recommended by PT    Recommendations for Other Services       Precautions / Restrictions Precautions Precautions: Fall Restrictions Weight Bearing Restrictions: No      Mobility  Bed Mobility Overal bed mobility: Needs Assistance Bed Mobility: Supine to Sit;Sit to Supine     Supine to sit: Supervision Sit to supine: Supervision   General bed mobility comments: pt able to get to EOB and then back in bed without physical assist but motions are slow and labored with increased WOB and pt has to rest in between transitions to catch breath  Transfers Overall transfer level: Needs assistance Equipment used: Rolling walker (2 wheeled) Transfers: Sit to/from Stand Sit to Stand: Min guard         General transfer comment: min-guard for safety with each stand, no LOB but pt more fatigued second stand and walk, even after seated rest  break  Ambulation/Gait Ambulation/Gait assistance: Min assist Ambulation Distance (Feet): 30 Feet (15' seated rest 15') Assistive device: Rolling walker (2 wheeled) Gait Pattern/deviations: Step-through pattern;Decreased stride length;Shuffle;Trunk flexed;Wide base of support Gait velocity: decreased Gait velocity interpretation: <1.8 ft/sec, indicative of risk for recurrent falls General Gait Details: pt with trembling in legs second time walking and reports that occaisonally the right knee gives way. Heavy reliance on RW   Stairs            Wheelchair Mobility    Modified Rankin (Stroke Patients Only)       Balance Overall balance assessment: Needs assistance;History of Falls                                           Pertinent Vitals/Pain Pain Assessment: Faces Faces Pain Scale: Hurts little more Pain Location: achy all over Pain Descriptors / Indicators: Aching Pain Intervention(s): Limited activity within patient's tolerance         O2 sats 97% at rest, 90% with ambulation, both on 3L    Home Living Family/patient expects to be discharged to:: Skilled nursing facility Living Arrangements: Spouse/significant other               Additional Comments: husband is currently in hospital after car accident    Prior Function Level of Independence: Needs assistance   Gait / Transfers Assistance Needed: uses RW in house, w/c when going to the dr  ADL's / Jiles Crocker Assistance Needed: does sponge  bath sitting on toilet, friend is present 8am-12pm daily to help with meals. Spouse home all other times but patient describes him at [facility] with failing health.   Comments: cannot stay alone, has had 24 hr care and HHPT     Hand Dominance   Dominant Hand: Right    Extremity/Trunk Assessment   Upper Extremity Assessment: Generalized weakness           Lower Extremity Assessment: Generalized weakness      Cervical / Trunk Assessment: Kyphotic   Communication   Communication: No difficulties  Cognition Arousal/Alertness: Awake/alert Behavior During Therapy: WFL for tasks assessed/performed;Anxious Overall Cognitive Status: Within Functional Limits for tasks assessed                      General Comments General comments (skin integrity, edema, etc.): pt reports when husband is able to leave hospital for SNF, she is agreeable to going as well to be with him and because she has needed 24 hr care at home    Exercises        Assessment/Plan    PT Assessment Patient needs continued PT services  PT Diagnosis Difficulty walking;Abnormality of gait;Generalized weakness;Acute pain   PT Problem List Decreased strength;Decreased activity tolerance;Decreased balance;Decreased mobility;Decreased knowledge of use of DME;Decreased knowledge of precautions;Pain;Obesity;Cardiopulmonary status limiting activity  PT Treatment Interventions DME instruction;Gait training;Functional mobility training;Therapeutic activities;Therapeutic exercise;Balance training;Patient/family education   PT Goals (Current goals can be found in the Care Plan section) Acute Rehab PT Goals Patient Stated Goal: go to rehab with her husband PT Goal Formulation: With patient Time For Goal Achievement: 03/16/16 Potential to Achieve Goals: Good    Frequency Min 2X/week   Barriers to discharge        Co-evaluation               End of Session Equipment Utilized During Treatment: Gait belt;Oxygen Activity Tolerance: Patient tolerated treatment well Patient left: in bed;with call bell/phone within reach Nurse Communication: Mobility status    Functional Assessment Tool Used: clinical judgement Functional Limitation: Mobility: Walking and moving around Mobility: Walking and Moving Around Current Status [id number]): At least 1 percent but less than 20 percent impaired, limited or restricted Mobility: Walking and Moving Around Goal Status [phone]): 0  percent impaired, limited or restricted    Time: [id number] PT Time Calculation (min) (ACUTE ONLY): 24 min   Charges:   PT Evaluation $PT Eval Moderate Complexity: 1 Procedure PT Treatments $Gait Training: 8-22 mins   PT G Codes:   PT G-Codes **NOT FOR INPATIENT CLASS** Functional Assessment Tool Used: clinical judgement Functional Limitation: Mobility: Walking and moving around Mobility: Walking and Moving Around Current Status [id number]): At least 1 percent but less than 20 percent impaired, limited or restricted Mobility: Walking and Moving Around Goal Status [phone]): 0 percent impaired, limited or restricted   Leighton Roach, [place], [place] 03/02/2016, 1:53 PM

## 2016-03-03 DIAGNOSIS — E119 Type 2 diabetes mellitus without complications: Secondary | ICD-10-CM | POA: Diagnosis not present

## 2016-03-03 DIAGNOSIS — K7460 Unspecified cirrhosis of liver: Secondary | ICD-10-CM | POA: Diagnosis not present

## 2016-03-03 DIAGNOSIS — I2510 Atherosclerotic heart disease of native coronary artery without angina pectoris: Secondary | ICD-10-CM | POA: Diagnosis not present

## 2016-03-03 DIAGNOSIS — D649 Anemia, unspecified: Secondary | ICD-10-CM | POA: Diagnosis not present

## 2016-03-03 DIAGNOSIS — N179 Acute kidney failure, unspecified: Secondary | ICD-10-CM | POA: Diagnosis not present

## 2016-03-03 DIAGNOSIS — I5032 Chronic diastolic (congestive) heart failure: Secondary | ICD-10-CM | POA: Diagnosis not present

## 2016-03-03 DIAGNOSIS — J449 Chronic obstructive pulmonary disease, unspecified: Secondary | ICD-10-CM | POA: Diagnosis not present

## 2016-03-03 LAB — BASIC METABOLIC PANEL
Anion gap: 4 — ABNORMAL LOW (ref 5–15)
BUN: 37 mg/dL — AB (ref 6–20)
CHLORIDE: 109 mmol/L (ref 101–111)
CO2: 26 mmol/L (ref 22–32)
Calcium: 9 mg/dL (ref 8.9–10.3)
Creatinine, Ser: 1.43 mg/dL — ABNORMAL HIGH (ref 0.44–1.00)
GFR calc Af Amer: 41 mL/min — ABNORMAL LOW (ref >60)
GFR calc non Af Amer: 36 mL/min — ABNORMAL LOW (ref >60)
Glucose, Bld: 168 mg/dL — ABNORMAL HIGH (ref 65–99)
POTASSIUM: 4 mmol/L (ref 3.5–5.1)
SODIUM: 139 mmol/L (ref 135–145)

## 2016-03-03 LAB — TYPE AND SCREEN
ABO/RH(D): O POS
ANTIBODY SCREEN: POSITIVE
DAT, IGG: NEGATIVE
DONOR AG TYPE: NEGATIVE
Donor AG Type: NEGATIVE
UNIT DIVISION: 0
Unit division: 0

## 2016-03-03 LAB — CBC
HEMATOCRIT: 29.1 % — AB (ref 36.0–46.0)
HEMOGLOBIN: 8.9 g/dL — AB (ref 12.0–15.0)
MCH: 30.5 pg (ref 26.0–34.0)
MCHC: 30.6 g/dL (ref 30.0–36.0)
MCV: 99.7 fL (ref 78.0–100.0)
Platelets: 79 10*3/uL — ABNORMAL LOW (ref 150–400)
RBC: 2.92 MIL/uL — AB (ref 3.87–5.11)
RDW: 18.5 % — ABNORMAL HIGH (ref 11.5–15.5)
WBC: 3.7 10*3/uL — ABNORMAL LOW (ref 4.0–10.5)

## 2016-03-03 LAB — GLUCOSE, CAPILLARY: Glucose-Capillary: 177 mg/dL — ABNORMAL HIGH (ref 65–99)

## 2016-03-03 MED ORDER — TORSEMIDE 20 MG PO TABS: 40.0000 mg | ORAL_TABLET | Freq: Every day | ORAL | Status: DC

## 2016-03-03 MED ORDER — INSULIN GLARGINE 100 UNIT/ML ~~LOC~~ SOLN
60.0000 [IU] | Freq: Every morning | SUBCUTANEOUS | Status: DC
  Administered 2016-03-03: 60 [IU] via SUBCUTANEOUS
  Filled 2016-03-03: qty 0.6

## 2016-03-03 NOTE — Discharge Summary (Signed)
Physician Discharge Summary   Patient ID: [name] MRN: [id number] DOB/AGE: 1941-02-13 75 y.o.  Admit date: 03/01/2016 Discharge date: 03/03/2016  Primary Care Physician:  Ria Bush, MD  Discharge Diagnoses:    . COPD with chronic bronchitis ([place]) . Liver cirrhosis secondary to NASH . Essential hypertension . Acute renal failure (ARF) ([place]) . Symptomatic anemia . DOE (dyspnea on exertion) . Chronic diastolic CHF (congestive heart failure) ([facility])  Consults: None  Recommendations for Outpatient Follow-up:  1. Outpatient) monitoring of H&H and transfuse  as needed 2. Please repeat CBC/BMET at next visit   DIET: Heart healthy diet    Allergies:   Allergies  Allergen Reactions  . Acetaminophen Other (See Comments)    Liver dysfunction  . Nsaids Other (See Comments)    Liver dysfunction   . Aspirin Other (See Comments)    Causes nosebleeds  . Theophylline Nausea And Vomiting  . Penicillin G Itching  . Penicillins Itching    Has patient had a PCN reaction causing immediate rash, facial/tongue/throat swelling, SOB or lightheadedness with hypotension: unknown Has patient had a PCN reaction causing severe rash involving mucus membranes or skin necrosis: unknown Has patient had a PCN reaction that required hospitalization unknown Has patient had a PCN reaction occurring within the last 10 years: yes If all of the above answers are "NO", then may proceed with Cephalosporin use.      DISCHARGE MEDICATIONS: Discharge Medication List as of 03/03/2016 10:29 AM    START taking these medications   Details  nystatin (MYCOSTATIN) 100000 UNIT/ML suspension Take 5 mLs (500,000 Units total) by mouth 4 (four) times daily. X 7DAYS, Starting 03/02/2016, Until Discontinued, Print      CONTINUE these medications which have NOT CHANGED   Details  ADVAIR DISKUS 250-50 MCG/DOSE AEPB INHALE 1 PUFF INTO LUNGS TWICE A DAY, Normal    albuterol (PROAIR HFA) 108 (90 BASE) MCG/ACT  inhaler Inhale 2 puffs into the lungs every 6 (six) hours as needed for wheezing or shortness of breath. Reported on 08/18/2015, Until Discontinued, Historical Med    ALPRAZolam (XANAX) 1 MG tablet TAKE 1/2 TO 1 TABLET TWICE A DAY AS NEEDED FOR ANXIETY AND SLEEP, Phone In    B Complex-C ([employer] B COMPLEX PLUS C) TABS TAKE ONE CAPSULE BY MOUTH EVERY DAY, Normal    buPROPion (WELLBUTRIN XL) 150 MG 24 hr tablet Take 150 mg by mouth daily., Until Discontinued, Historical Med    desvenlafaxine (PRISTIQ) 50 MG 24 hr tablet Take 3 tablets (150 mg total) by mouth daily., Starting 05/12/2015, Until Discontinued, Normal    diclofenac sodium (VOLTAREN) 1 % GEL Apply 1 application topically 3 (three) times daily., Starting 01/11/2016, Until Discontinued, Normal    ferrous sulfate 325 (65 FE) MG tablet TAKE 1 TABLET BY MOUTH EVERY DAY WITH BREAKFAST, Normal    guaiFENesin (MUCINEX) 600 MG 12 hr tablet Take 600 mg by mouth 2 (two) times daily. , Until Discontinued, Historical Med    guaiFENesin-codeine 100-10 MG/5ML syrup Take 5 mLs by mouth 2 (two) times daily as needed for cough. , Starting 12/13/2015, Until Discontinued, Historical Med    Insulin Glargine (LANTUS SOLOSTAR) 100 UNIT/ML Solostar Pen Inject 120 Units into the skin every morning., Starting 04/13/2015, Until Discontinued, Normal    insulin lispro (HUMALOG KWIKPEN) 100 UNIT/ML KiwkPen Inject 5-9 Units into the skin 2 (two) times daily as needed (CBG >300). Inject 5 units subcutaneously with lunch and supper for CBG >300, if CBG >350 at supper  inject 8-9 units, Until Discontinued, Historical Med    isosorbide mononitrate (IMDUR) 30 MG 24 hr tablet Take 0.5 tablets (15 mg total) by mouth daily., Starting 12/19/2015, Until Discontinued, Normal    lactulose, encephalopathy, (CHRONULAC) 10 GM/15ML SOLN Take 20 g by mouth at bedtime. , Starting 11/21/2015, Until Discontinued, Historical Med    levalbuterol (XOPENEX) 0.63 MG/3ML nebulizer solution Take  0.63 mg by nebulization every 8 (eight) hours as needed for wheezing or shortness of breath. , Starting 06/05/2014, Until Discontinued, Historical Med    loratadine (CLARITIN) 10 MG tablet TAKE 1 TABLET BY MOUTH EVERY DAY, Normal    metolazone (ZAROXOLYN) 2.5 MG tablet TAKE AS DIRECTED FOR 4 LB WEIGHT GAIN, Normal    Multiple Vitamin (MULTIVITAMIN WITH MINERALS) TABS tablet Take 1 tablet by mouth daily. Centrum Silver, Until Discontinued, Historical Med    mupirocin cream (BACTROBAN) 2 % Apply 1 application topically 2 (two) times daily as needed (wound care). Reported on 11/14/2015, Until Discontinued, Historical Med    nitroGLYCERIN (NITROSTAT) 0.4 MG SL tablet Place 1 tablet (0.4 mg total) under the tongue every 5 (five) minutes as needed for chest pain. No more than 3 in 15 minutes, Starting 02/16/2016, Until Discontinued, Normal    oxyCODONE (OXY IR/ROXICODONE) 5 MG immediate release tablet Take 1 tablet (5 mg total) by mouth every 6 (six) hours as needed for moderate pain or severe pain., Starting 02/28/2016, Until Discontinued, Print    OXYGEN Inhale 3-4 L into the lungs continuous., Until Discontinued, Historical Med    pantoprazole (PROTONIX) 40 MG tablet TAKE 1 TABLET BY MOUTH TWICE A DAY, Normal    Polyethyl Glycol-Propyl Glycol (SYSTANE OP) Place 1 drop into both eyes daily., Until Discontinued, Historical Med    potassium chloride SA (K-DUR,KLOR-CON) 20 MEQ tablet Take 40 mEq by mouth 2 (two) times daily. , Until Discontinued, Historical Med    promethazine (PHENERGAN) 25 MG tablet Take 25 mg by mouth 2 (two) times daily as needed for nausea or vomiting., Until Discontinued, Historical Med    QUEtiapine (SEROQUEL) 200 MG tablet TAKE 1 TABLET BY MOUTH AT BEDTIME, Normal    rifaximin (XIFAXAN) 550 MG TABS tablet Take 550 mg by mouth 2 (two) times daily. Reported on 10/17/2015, Until Discontinued, Historical Med    rOPINIRole (REQUIP) 4 MG tablet TAKE 1 TABLET BY MOUTH AT BEDTIME,  Normal    simvastatin (ZOCOR) 40 MG tablet Take 40 mg by mouth at bedtime. , Until Discontinued, Historical Med    sodium chloride (OCEAN) 0.65 % SOLN nasal spray Place 1 spray into both nostrils 2 (two) times daily as needed for congestion., Until Discontinued, Historical Med    sulfamethoxazole-trimethoprim (BACTRIM DS,SEPTRA DS) 800-160 MG tablet Take 1 tablet by mouth 2 (two) times daily., Starting 02/26/2016, Until Discontinued, Normal    torsemide (DEMADEX) 20 MG tablet Take 40 mg by mouth daily., Until Discontinued, Historical Med    Vitamin D, Ergocalciferol, (DRISDOL) 50000 units CAPS capsule Take 50,000 Units by mouth every Friday. , Starting 01/29/2016, Until Discontinued, Historical Med    glucose blood (ONE TOUCH ULTRA TEST) test strip Check blood sugar 4 times daily, Normal    Insulin Pen Needle (B-D ULTRAFINE III SHORT PEN) 31G X 8 MM MISC Use as directed to inject insulin twice daily. Dx: E11.8, Normal    ONETOUCH DELICA LANCETS 99991111 MISC USE TO CHECK SUGAR 4 TIMES DAILY AND AS NEEDED. 123456 **ONE TOUCH DELICA**, Normal         Brief H  and P: For complete details please refer to admission H and P, but in brief [name] is a nervous, anxious 75 y.o. female with medical history significant for diabetes, chronic respiratory failure on 2 L oxygen, COPD, CHF, cirrhosis, hypertension, iron deficiency anemia since to the emergency department with the chief complaint of dyspnea on exertion. Initial evaluation concerning for symptomatic anemia. She reported being in her usual state of health until the day of admission, she awakened walk to the bathroom and became very short of breath and "weak all over". Associated symptoms include intermittent cough with intermittent sputum production. She denied chest pain palpitations headache dizziness syncope or near-syncope. She denied lower extremity edema or orthopnea. She denied any abdominal pain nausea vomiting diarrhea constipation. She  denied dysuria hematuria frequency or urgency. She denied melena or bright red blood per rectum. She did report some chills and subjective fever.   Hospital Course:   Symptomatic anemia. Hemoglobin 8.0 on admission.  - Per patient she has chronic anemia and has received blood transfusions in the past and procrit injection this week on 7/5. Baseline around 8.5-9. 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   Dyspnea on exertion with chronic hypoxic respiratory failure. On 2 L oxygen at home, underlying COPD..  - Likely related to #1 in setting of possible progression of COPD. Chest x-ray with no active cardiopulmonary process. No signs of CHF no signs of pneumonia. Interstitial lung disease. BNP within the limits of normal. Troponin negative - Continue nebs,  Wean O2 as tolerated   Acute kidney injury : Creatinine 1.6 on admission. -Creatinine improved to 1.4 at the time of discharge.  Chronic diastolic heart failure. Echo done in March of this year reveals an EF of 60-65% and grade 2 diastolic dysfunction. Home medications include Zaroxolyn and demadex -Demadex and Zaroxolyn was held due to acute kidney injury, creatinine has improved, patient may resume Demadex. She was also counseled strongly to follow-up with Dr. Burt Knack next 2 weeks regarding adjustment of doses of her diuretics.   Diabetes. Home medications include Lantus 120 units every a.m.  - Continue insulin per home regimen  Liver cirrhosis secondary to [place] stable. Chart review indicates followed by Dr. Olevia Perches -continue rifaxim -continue lactulose  Anxiety - Per patient, husband at [facility], has private caregivers at home - Continue Xanax as needed, Seroquel  Day of Discharge BP 150/46 mmHg  Pulse  56  Temp(Src) 97.9 F (36.6 C) (Oral)  Resp 18  Ht 5\' 2"  (1.575 m)  Wt 97.523 kg (215 lb)  BMI 39.31 kg/m2  SpO2 96%  Physical Exam: General: Alert and awake oriented x3 not in any acute distress. HEENT: anicteric sclera, pupils reactive to light and accommodation CVS: S1-S2 clear no murmur rubs or gallops Chest: clear to auscultation bilaterally, no wheezing rales or rhonchi Abdomen: soft nontender, nondistended, normal bowel sounds Extremities: no cyanosis, clubbing or edema noted bilaterally Neuro: Cranial nerves II-XII intact, no focal neurological deficits   The results of significant diagnostics from this hospitalization (including imaging, microbiology, ancillary and laboratory) are listed below for reference.    LAB RESULTS: Basic Metabolic Panel:  Recent Labs Lab 03/02/16 0522 03/03/16 0537  NA 142 139  K 3.8 4.0  CL 109 109  CO2 27 26  GLUCOSE 202* 168*  BUN 42* 37*  CREATININE 1.59* 1.43*  CALCIUM 8.8* 9.0   Liver Function Tests:  Recent Labs Lab 03/01/16 1002  AST 39  ALT 29  ALKPHOS 119  BILITOT 1.3*  PROT 5.7*  ALBUMIN 3.0*   No results for input(s): LIPASE, AMYLASE in the last 168 hours. No results for input(s): AMMONIA in the last 168 hours. CBC:  Recent Labs Lab 03/02/16 0522 03/02/16 1633 03/03/16 0537  WBC 3.6*  --  3.7*  HGB 8.2* 9.0* 8.9*  HCT 26.5* 29.1* 29.1*  MCV 99.3  --  99.7  PLT 90*  --  79*   Cardiac Enzymes: No results for input(s): CKTOTAL, CKMB, CKMBINDEX, TROPONINI in the last 168 hours. BNP: Invalid input(s): POCBNP CBG:  Recent Labs Lab 03/02/16 2057 03/03/16 0720  GLUCAP 226* 177*    Significant Diagnostic Studies:  Dg Chest 2 View  03/01/2016  CLINICAL DATA:  Shortness breath for 3 weeks. EXAM: CHEST  2 VIEW COMPARISON:  Chest x-rays dated 12/11/2015 and 08/24/2015. FINDINGS: Cardiomediastinal silhouette is stable in size and configuration. Mild cardiomegaly is unchanged. Atherosclerotic changes again  noted at the aortic arch. Mild interstitial prominence appears stable, presumably chronic interstitial lung disease. No confluent opacity to suggest a developing pneumonia. No pleural effusion or pneumothorax seen. Mild degenerative spurring noted within the thoracic spine. No acute appearing osseous abnormality. IMPRESSION: No active cardiopulmonary disease. No evidence of pneumonia. No evidence of CHF. Probable chronic interstitial lung disease, mild. Stable cardiomegaly. Aortic atherosclerosis. Electronically Signed   By: Franki Cabot M.D.   On: 03/01/2016 11:15    2D ECHO:   Disposition and Follow-up: Discharge Instructions    Diet - low sodium heart healthy    Complete by:  As directed      Increase activity slowly    Complete by:  As directed             DISPOSITION: Home with home health  DISCHARGE FOLLOW-UP Follow-up Information    Follow up with Ria Bush, MD. Schedule an appointment as soon as possible for a visit in 2 weeks.   Specialty:  Family Medicine   Why:  for hospital follow-up   Contact information:   [place] [address] [phone]       Follow up with Sherren Mocha, MD. Schedule an appointment as soon as possible for a visit in 2 weeks.   Specialty:  Cardiology   Why:  for hospital follow-up   Contact information:   [address] [place] [address] [phone]       Follow up with NADEL,SCOTT M, MD. Schedule an appointment as soon as possible for a visit in 2 weeks.   Specialty:  Pulmonary Disease   Why:  for hospital follow-up   Contact information:   [place] [address] [phone]        Time spent on Discharge: 25 minutes  Signed:   RAI,RIPUDEEP M.D. [facility] 03/03/2016, 11:50 AM Pager: [phone]

## 2016-03-04 ENCOUNTER — Telehealth: Payer: Self-pay

## 2016-03-04 NOTE — Telephone Encounter (Signed)
Noted  

## 2016-03-04 NOTE — Telephone Encounter (Signed)
1st attempt - TCM outreach

## 2016-03-05 DIAGNOSIS — K7469 Other cirrhosis of liver: Secondary | ICD-10-CM | POA: Diagnosis not present

## 2016-03-05 DIAGNOSIS — M1991 Primary osteoarthritis, unspecified site: Secondary | ICD-10-CM | POA: Diagnosis not present

## 2016-03-05 DIAGNOSIS — K7581 Nonalcoholic steatohepatitis (NASH): Secondary | ICD-10-CM | POA: Diagnosis not present

## 2016-03-05 DIAGNOSIS — M797 Fibromyalgia: Secondary | ICD-10-CM | POA: Diagnosis not present

## 2016-03-05 DIAGNOSIS — I130 Hypertensive heart and chronic kidney disease with heart failure and stage 1 through stage 4 chronic kidney disease, or unspecified chronic kidney disease: Secondary | ICD-10-CM | POA: Diagnosis not present

## 2016-03-05 DIAGNOSIS — J9611 Chronic respiratory failure with hypoxia: Secondary | ICD-10-CM | POA: Diagnosis not present

## 2016-03-05 DIAGNOSIS — I2510 Atherosclerotic heart disease of native coronary artery without angina pectoris: Secondary | ICD-10-CM | POA: Diagnosis not present

## 2016-03-05 DIAGNOSIS — N183 Chronic kidney disease, stage 3 (moderate): Secondary | ICD-10-CM | POA: Diagnosis not present

## 2016-03-05 DIAGNOSIS — I5032 Chronic diastolic (congestive) heart failure: Secondary | ICD-10-CM | POA: Diagnosis not present

## 2016-03-05 DIAGNOSIS — J449 Chronic obstructive pulmonary disease, unspecified: Secondary | ICD-10-CM | POA: Diagnosis not present

## 2016-03-05 DIAGNOSIS — E1142 Type 2 diabetes mellitus with diabetic polyneuropathy: Secondary | ICD-10-CM | POA: Diagnosis not present

## 2016-03-05 DIAGNOSIS — D61818 Other pancytopenia: Secondary | ICD-10-CM | POA: Diagnosis not present

## 2016-03-05 DIAGNOSIS — E1122 Type 2 diabetes mellitus with diabetic chronic kidney disease: Secondary | ICD-10-CM | POA: Diagnosis not present

## 2016-03-05 NOTE — Telephone Encounter (Signed)
2nd attempt - TCM Outreach  Patient has multiple concerns - insomnia, increased anxiety, back pain, and shortness of breath. Pt is currently on oxygen 3.5 liters nasal cannula continuous. Requested patient monitor oxygen saturation while on call. Reading was 94%.

## 2016-03-05 NOTE — Telephone Encounter (Signed)
Pt returned call due to loss of signal on previous conversation.  Transition Care Management Follow-up Telephone Call    Date discharged? 03/03/2016   How have you been since you were released from the hospital? Still experiencing chronic conditions and symptoms such as SOB and back pain; blood glucose readings are stable; taking medications as prescribed; finished course of antibiotics today   Do you understand why you were in the hospital? Yes   Do you understand the discharge instructions? Yes   Where were you discharged to? Home with 24 hr support from multiple caregivers   Items Reviewed:  Medications reviewed: Yes  Allergies reviewed: Yes  Dietary changes reviewed: Yes, heart healthy  Referrals reviewed: N/A   Functional Questionnaire:   Activities of Daily Living (ADLs):   Patient has assistance with ADLs as needed from caregivers. Pt ambulates with a walker and has a hx of multiple falls with injury. Pt does not drive but denies having any transportation needs at this time.    Any patient concerns? Pt wants PCP to help her with Medicaid application so that she and husband can reside in a skilled nursing facility.    Confirmed importance and date/time of follow-up visits scheduled YES  Provider Appointment booked with Dr. Danise Mina on 03/08/16 @ 1400  Confirmed with patient if condition begins to worsen call PCP or go to the ER.  Patient was given the office number and encouraged to call back with question or concerns.  : YES

## 2016-03-07 ENCOUNTER — Telehealth: Payer: Self-pay

## 2016-03-07 DIAGNOSIS — K7469 Other cirrhosis of liver: Secondary | ICD-10-CM | POA: Diagnosis not present

## 2016-03-07 DIAGNOSIS — N183 Chronic kidney disease, stage 3 (moderate): Secondary | ICD-10-CM | POA: Diagnosis not present

## 2016-03-07 DIAGNOSIS — I130 Hypertensive heart and chronic kidney disease with heart failure and stage 1 through stage 4 chronic kidney disease, or unspecified chronic kidney disease: Secondary | ICD-10-CM | POA: Diagnosis not present

## 2016-03-07 DIAGNOSIS — I2510 Atherosclerotic heart disease of native coronary artery without angina pectoris: Secondary | ICD-10-CM | POA: Diagnosis not present

## 2016-03-07 DIAGNOSIS — D61818 Other pancytopenia: Secondary | ICD-10-CM | POA: Diagnosis not present

## 2016-03-07 DIAGNOSIS — E1122 Type 2 diabetes mellitus with diabetic chronic kidney disease: Secondary | ICD-10-CM | POA: Diagnosis not present

## 2016-03-07 DIAGNOSIS — J449 Chronic obstructive pulmonary disease, unspecified: Secondary | ICD-10-CM | POA: Diagnosis not present

## 2016-03-07 DIAGNOSIS — M797 Fibromyalgia: Secondary | ICD-10-CM | POA: Diagnosis not present

## 2016-03-07 DIAGNOSIS — K7581 Nonalcoholic steatohepatitis (NASH): Secondary | ICD-10-CM | POA: Diagnosis not present

## 2016-03-07 DIAGNOSIS — E1142 Type 2 diabetes mellitus with diabetic polyneuropathy: Secondary | ICD-10-CM | POA: Diagnosis not present

## 2016-03-07 DIAGNOSIS — I5032 Chronic diastolic (congestive) heart failure: Secondary | ICD-10-CM | POA: Diagnosis not present

## 2016-03-07 DIAGNOSIS — J9611 Chronic respiratory failure with hypoxia: Secondary | ICD-10-CM | POA: Diagnosis not present

## 2016-03-07 DIAGNOSIS — M1991 Primary osteoarthritis, unspecified site: Secondary | ICD-10-CM | POA: Diagnosis not present

## 2016-03-07 NOTE — Telephone Encounter (Signed)
Dr. Darnell Level,   Pt would like to know if you have completed paperwork to assist with admission to [facility]. Please contact her to discuss at your convenience.   Thanks, Katha Cabal

## 2016-03-08 ENCOUNTER — Ambulatory Visit (INDEPENDENT_AMBULATORY_CARE_PROVIDER_SITE_OTHER): Payer: Medicare Other | Admitting: Family Medicine

## 2016-03-08 ENCOUNTER — Encounter: Payer: Self-pay | Admitting: Family Medicine

## 2016-03-08 VITALS — BP 130/72 | HR 79 | Wt 205.0 lb

## 2016-03-08 DIAGNOSIS — Z794 Long term (current) use of insulin: Secondary | ICD-10-CM

## 2016-03-08 DIAGNOSIS — Z515 Encounter for palliative care: Secondary | ICD-10-CM

## 2016-03-08 DIAGNOSIS — J449 Chronic obstructive pulmonary disease, unspecified: Secondary | ICD-10-CM | POA: Diagnosis not present

## 2016-03-08 DIAGNOSIS — J9611 Chronic respiratory failure with hypoxia: Secondary | ICD-10-CM | POA: Diagnosis not present

## 2016-03-08 DIAGNOSIS — K31819 Angiodysplasia of stomach and duodenum without bleeding: Secondary | ICD-10-CM

## 2016-03-08 DIAGNOSIS — K7469 Other cirrhosis of liver: Secondary | ICD-10-CM | POA: Diagnosis not present

## 2016-03-08 DIAGNOSIS — N183 Chronic kidney disease, stage 3 unspecified: Secondary | ICD-10-CM

## 2016-03-08 DIAGNOSIS — K7581 Nonalcoholic steatohepatitis (NASH): Secondary | ICD-10-CM

## 2016-03-08 DIAGNOSIS — K754 Autoimmune hepatitis: Secondary | ICD-10-CM | POA: Diagnosis not present

## 2016-03-08 DIAGNOSIS — K5520 Angiodysplasia of colon without hemorrhage: Secondary | ICD-10-CM

## 2016-03-08 DIAGNOSIS — M1991 Primary osteoarthritis, unspecified site: Secondary | ICD-10-CM | POA: Diagnosis not present

## 2016-03-08 DIAGNOSIS — Z66 Do not resuscitate: Secondary | ICD-10-CM

## 2016-03-08 DIAGNOSIS — D61818 Other pancytopenia: Secondary | ICD-10-CM | POA: Diagnosis not present

## 2016-03-08 DIAGNOSIS — IMO0001 Reserved for inherently not codable concepts without codable children: Secondary | ICD-10-CM

## 2016-03-08 DIAGNOSIS — I2510 Atherosclerotic heart disease of native coronary artery without angina pectoris: Secondary | ICD-10-CM | POA: Diagnosis not present

## 2016-03-08 DIAGNOSIS — I5032 Chronic diastolic (congestive) heart failure: Secondary | ICD-10-CM | POA: Diagnosis not present

## 2016-03-08 DIAGNOSIS — M797 Fibromyalgia: Secondary | ICD-10-CM | POA: Diagnosis not present

## 2016-03-08 DIAGNOSIS — E1142 Type 2 diabetes mellitus with diabetic polyneuropathy: Secondary | ICD-10-CM | POA: Diagnosis not present

## 2016-03-08 DIAGNOSIS — I130 Hypertensive heart and chronic kidney disease with heart failure and stage 1 through stage 4 chronic kidney disease, or unspecified chronic kidney disease: Secondary | ICD-10-CM | POA: Diagnosis not present

## 2016-03-08 DIAGNOSIS — E1122 Type 2 diabetes mellitus with diabetic chronic kidney disease: Secondary | ICD-10-CM | POA: Diagnosis not present

## 2016-03-08 DIAGNOSIS — K7460 Unspecified cirrhosis of liver: Secondary | ICD-10-CM

## 2016-03-08 LAB — CBC WITH DIFFERENTIAL/PLATELET
Basophils Absolute: 40 cells/uL (ref 0–200)
Basophils Relative: 1 %
Eosinophils Absolute: 120 cells/uL (ref 15–500)
Eosinophils Relative: 3 %
HEMATOCRIT: 29.4 % — AB (ref 35.0–45.0)
HEMOGLOBIN: 9.7 g/dL — AB (ref 11.7–15.5)
LYMPHS ABS: 440 {cells}/uL — AB (ref 850–3900)
Lymphocytes Relative: 11 %
MCH: 30.8 pg (ref 27.0–33.0)
MCHC: 33 g/dL (ref 32.0–36.0)
MCV: 93.3 fL (ref 80.0–100.0)
MONO ABS: 320 {cells}/uL (ref 200–950)
MPV: 9.1 fL (ref 7.5–12.5)
Monocytes Relative: 8 %
NEUTROS PCT: 77 %
Neutro Abs: 3080 cells/uL (ref 1500–7800)
Platelets: 89 10*3/uL — ABNORMAL LOW (ref 140–400)
RBC: 3.15 MIL/uL — AB (ref 3.80–5.10)
RDW: 16.4 % — ABNORMAL HIGH (ref 11.0–15.0)
WBC: 4 10*3/uL (ref 3.8–10.8)

## 2016-03-08 NOTE — Assessment & Plan Note (Signed)
Appreciate [facility] assistance with ALF move.

## 2016-03-08 NOTE — Patient Instructions (Addendum)
You are doing well today. Keep it up! Continue medicines as up to now.  Labs today.

## 2016-03-08 NOTE — Assessment & Plan Note (Signed)
Discharge Cr 1.4. Recheck renal panel today.

## 2016-03-08 NOTE — Assessment & Plan Note (Signed)
Received 2u pRBC this month during hospitalization. Will recheck CBC today.

## 2016-03-08 NOTE — Progress Notes (Signed)
BP 130/72 mmHg  Pulse 79  Wt 205 lb (92.987 kg)  SpO2 91% on oxygen  CC: hosp f/u visit  Subjective:    Patient ID: [name], female    DOB: 02-04-1941, 75 y.o.   MRN: [id number]  HPI: [name] is a 75 y.o. female presenting on 03/08/2016 for Hospitalization Follow-up   Presents with daughter [name]. Pt planning on moving in to [place] as early as Monday. [facility] currently at [facility], hopeful to also go to [facility].  Recent hospitalization for progressively worsening dyspnea on exertion thought 2/2 worsening anemia (Hgb 8.0) s/p 2u pRBC transfusion. 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 iFOB negative. She slowly improved after transfusion and was discharged back home with home hospice involvement ([facility]).   Denies significant dyspnea. Overall feeing better. Weight 10lb down.   Pt requests assistance for Medicaid application so she and husband can move into [place] home. [name] SW at [employer] has been working with patient to get her ready to go to ALF.   Admit date: 03/01/2016 Discharge date: 03/03/2016 F/u phone call completed 03/05/2016  Discharge Diagnoses:  . COPD with chronic bronchitis ([place]) . Liver cirrhosis secondary to NASH . Essential hypertension . Acute renal failure (ARF) ([place]) . Symptomatic anemia . DOE (dyspnea on exertion) . Chronic diastolic CHF (congestive heart failure) (HCC)  Consults: None Recommendations for Outpatient Follow-up:  1. Outpatient) monitoring of H&H and transfuse as needed 2. Please repeat CBC/BMET at next visit  DIET: Heart healthy diet  Relevant past medical, surgical, family and social history reviewed and updated as indicated. Interim medical history since our last visit reviewed. Allergies and medications reviewed and updated. Current Outpatient Prescriptions on File Prior to Visit  Medication  Sig  . ADVAIR DISKUS 250-50 MCG/DOSE AEPB INHALE 1 PUFF INTO LUNGS TWICE A DAY  . albuterol (PROAIR HFA) 108 (90 BASE) MCG/ACT inhaler Inhale 2 puffs into the lungs every 6 (six) hours as needed for wheezing or shortness of breath. Reported on 08/18/2015  . ALPRAZolam (XANAX) 1 MG tablet TAKE 1/2 TO 1 TABLET TWICE A DAY AS NEEDED FOR ANXIETY AND SLEEP  . B Complex-C ([employer] B COMPLEX PLUS C) TABS TAKE ONE CAPSULE BY MOUTH EVERY DAY  . buPROPion (WELLBUTRIN XL) 150 MG 24 hr tablet Take 150 mg by mouth daily.  [name] desvenlafaxine (PRISTIQ) 50 MG 24 hr tablet Take 3 tablets (150 mg total) by mouth daily.  . diclofenac sodium (VOLTAREN) 1 % GEL Apply 1 application topically 3 (three) times daily. (Patient taking differently: Apply 1 application topically 3 (three) times daily as needed (pain). )  . ferrous sulfate 325 (65 FE) MG tablet TAKE 1 TABLET BY MOUTH EVERY DAY WITH BREAKFAST  . glucose blood (ONE TOUCH ULTRA TEST) test strip Check blood sugar 4 times daily  . guaiFENesin (MUCINEX) 600 MG 12 hr tablet Take 600 mg by mouth 2 (two) times daily.   [name] guaiFENesin-codeine 100-10 MG/5ML syrup Take 5 mLs by mouth 2 (two) times daily as needed for cough.   . Insulin Glargine (LANTUS SOLOSTAR) 100 UNIT/ML Solostar Pen Inject 120 Units into the skin every morning. (Patient taking differently: Inject 120 Units into the skin daily with breakfast. )  . insulin lispro (HUMALOG KWIKPEN) 100 UNIT/ML KiwkPen Inject 5-9 Units into the skin 2 (two) times daily as needed (CBG >300). Inject 5 units subcutaneously with lunch and supper for  CBG >300, if CBG >350 at supper inject 8-9 units  . Insulin Pen Needle (B-D ULTRAFINE III SHORT PEN) 31G X 8 MM MISC Use as directed to inject insulin twice daily. Dx: E11.8  . isosorbide mononitrate (IMDUR) 30 MG 24 hr tablet Take 0.5 tablets (15 mg total) by mouth daily.  [name] lactulose, encephalopathy, (CHRONULAC) 10 GM/15ML SOLN Take 20 g by mouth at bedtime.   . levalbuterol (XOPENEX)  0.63 MG/3ML nebulizer solution Take 0.63 mg by nebulization every 8 (eight) hours as needed for wheezing or shortness of breath.   . loratadine (CLARITIN) 10 MG tablet TAKE 1 TABLET BY MOUTH EVERY DAY  . metolazone (ZAROXOLYN) 2.5 MG tablet TAKE AS DIRECTED FOR 4 LB WEIGHT GAIN (Patient taking differently: TAKE 1 TABLET BY MOUTH  FOR 4 LB WEIGHT GAIN IN 24 HOURS)  . Multiple Vitamin (MULTIVITAMIN WITH MINERALS) TABS tablet Take 1 tablet by mouth daily. Centrum Silver  . mupirocin cream (BACTROBAN) 2 % Apply 1 application topically 2 (two) times daily as needed (wound care). Reported on 11/14/2015  . nitroGLYCERIN (NITROSTAT) 0.4 MG SL tablet Place 1 tablet (0.4 mg total) under the tongue every 5 (five) minutes as needed for chest pain. No more than 3 in 15 minutes  . nystatin (MYCOSTATIN) 100000 UNIT/ML suspension Take 5 mLs (500,000 Units total) by mouth 4 (four) times daily. X 7DAYS  . ONETOUCH DELICA LANCETS 99991111 MISC USE TO CHECK SUGAR 4 TIMES DAILY AND AS NEEDED. 123456 **ONE TOUCH DELICA**  . oxyCODONE (OXY IR/ROXICODONE) 5 MG immediate release tablet Take 1 tablet (5 mg total) by mouth every 6 (six) hours as needed for moderate pain or severe pain.  . OXYGEN Inhale 3-4 L into the lungs continuous.  . pantoprazole (PROTONIX) 40 MG tablet TAKE 1 TABLET BY MOUTH TWICE A DAY  . Polyethyl Glycol-Propyl Glycol (SYSTANE OP) Place 1 drop into both eyes daily.  . potassium chloride SA (K-DUR,KLOR-CON) 20 MEQ tablet Take 40 mEq by mouth 2 (two) times daily.   . promethazine (PHENERGAN) 25 MG tablet Take 25 mg by mouth 2 (two) times daily as needed for nausea or vomiting.  [name] QUEtiapine (SEROQUEL) 200 MG tablet TAKE 1 TABLET BY MOUTH AT BEDTIME  . rifaximin (XIFAXAN) 550 MG TABS tablet Take 550 mg by mouth 2 (two) times daily. Reported on 10/17/2015  . rOPINIRole (REQUIP) 4 MG tablet TAKE 1 TABLET BY MOUTH AT BEDTIME  . simvastatin (ZOCOR) 40 MG tablet Take 40 mg by mouth at bedtime.   . sodium chloride  (OCEAN) 0.65 % SOLN nasal spray Place 1 spray into both nostrils 2 (two) times daily as needed for congestion.  . torsemide (DEMADEX) 20 MG tablet Take 40 mg by mouth daily.  . Vitamin D, Ergocalciferol, (DRISDOL) 50000 units CAPS capsule Take 50,000 Units by mouth every Friday.    No current facility-administered medications on file prior to visit.    Review of Systems Per HPI unless specifically indicated in ROS section     Objective:    BP 130/72 mmHg  Pulse 79  Wt 205 lb (92.987 kg)  SpO2 91%  Wt Readings from Last 3 Encounters:  03/08/16 205 lb (92.987 kg)  03/03/16 215 lb (97.523 kg)  02/26/16 206 lb (93.441 kg)   Body mass index is 37.49 kg/(m^2).  Physical Exam  Constitutional: She appears well-developed and well-nourished. No distress.  More sprightly than she has previously been Sitting in wheelchair  HENT:  Mouth/Throat: Oropharynx is clear and moist. No  oropharyngeal exudate.  Cardiovascular: Normal rate, regular rhythm and intact distal pulses.   Murmur (faint systolic) heard. Pulmonary/Chest: Effort normal and breath sounds normal. No respiratory distress. She has no wheezes. She has no rales.  Musculoskeletal: She exhibits edema (tr).  Skin:  Small ecchymoses on skin  Psychiatric: She has a normal mood and affect.  Nursing note and vitals reviewed.   Lab Results  Component Value Date   HGBA1C 5.6 03/01/2016      Assessment & Plan:   Problem List Items Addressed This Visit    Autoimmune hepatitis ([place]) (Chronic)   Angiodysplasia of intestine   Obesity, Class II, BMI 35-39.9, with comorbidity ([place])   GAVE (gastric antral vascular ectasia)    Received 2u pRBC this month during hospitalization. Will recheck CBC today.       Relevant Orders   CBC with Differential/Platelet   CKD stage 3 secondary to diabetes [facility])    Discharge Cr 1.4. Recheck renal panel today.      Relevant Orders   Renal function panel   Hepatic cirrhosis ([place])   Palliative  care status    Appreciate [facility] assistance with ALF move.       DNR (do not resuscitate)   Liver cirrhosis secondary to NASH - Primary   COPD with chronic bronchitis ([place])    Ongoing mild sputum production, reviewed plain mucinex use.       Chronic hypoxemic respiratory failure (HCC)   Diabetes (HCC)    Update fructosamine.       Relevant Orders   Fructosamine       Follow up plan: Return as needed.  Ria Bush, MD

## 2016-03-08 NOTE — Telephone Encounter (Signed)
Pt typically has to bring in nursing home FL2 form for [place] to fill out.  [name] says this is something that nursing home should give patient.  I will see patient today.

## 2016-03-08 NOTE — Assessment & Plan Note (Signed)
Ongoing mild sputum production, reviewed plain mucinex use.

## 2016-03-08 NOTE — Assessment & Plan Note (Signed)
Update fructosamine.

## 2016-03-09 LAB — RENAL FUNCTION PANEL
Albumin: 3.2 g/dL — ABNORMAL LOW (ref 3.6–5.1)
BUN: 44 mg/dL — ABNORMAL HIGH (ref 7–25)
CHLORIDE: 106 mmol/L (ref 98–110)
CO2: 26 mmol/L (ref 20–31)
CREATININE: 1.14 mg/dL — AB (ref 0.60–0.93)
Calcium: 8.5 mg/dL — ABNORMAL LOW (ref 8.6–10.4)
Glucose, Bld: 173 mg/dL — ABNORMAL HIGH (ref 65–99)
PHOSPHORUS: 3.6 mg/dL (ref 2.1–4.3)
POTASSIUM: 3.6 mmol/L (ref 3.5–5.3)
SODIUM: 144 mmol/L (ref 135–146)

## 2016-03-11 ENCOUNTER — Encounter: Payer: Self-pay | Admitting: *Deleted

## 2016-03-12 ENCOUNTER — Encounter: Payer: Self-pay | Admitting: *Deleted

## 2016-03-13 ENCOUNTER — Telehealth: Payer: Self-pay

## 2016-03-13 ENCOUNTER — Encounter (HOSPITAL_COMMUNITY)
Admission: RE | Admit: 2016-03-13 | Discharge: 2016-03-13 | Disposition: A | Discharge status: Home / Self Care | Payer: Medicare Other | Source: Ambulatory Visit | Attending: Family Medicine | Admitting: Family Medicine

## 2016-03-13 DIAGNOSIS — K7460 Unspecified cirrhosis of liver: Secondary | ICD-10-CM | POA: Diagnosis not present

## 2016-03-13 DIAGNOSIS — D649 Anemia, unspecified: Secondary | ICD-10-CM | POA: Diagnosis not present

## 2016-03-13 DIAGNOSIS — N289 Disorder of kidney and ureter, unspecified: Secondary | ICD-10-CM | POA: Diagnosis not present

## 2016-03-13 DIAGNOSIS — Q2733 Arteriovenous malformation of digestive system vessel: Secondary | ICD-10-CM | POA: Diagnosis not present

## 2016-03-13 LAB — POCT HEMOGLOBIN-HEMACUE: HEMOGLOBIN: 8.7 g/dL — AB (ref 12.0–15.0)

## 2016-03-13 MED ORDER — DARBEPOETIN ALFA 200 MCG/0.4ML IJ SOSY
PREFILLED_SYRINGE | INTRAMUSCULAR | Status: AC
Start: 2016-03-13 — End: 2016-03-13
  Administered 2016-03-13: 200 ug via SUBCUTANEOUS
  Filled 2016-03-13: qty 0.4

## 2016-03-13 MED ORDER — DARBEPOETIN ALFA 200 MCG/0.4ML IJ SOSY
200.0000 ug | PREFILLED_SYRINGE | INTRAMUSCULAR | Status: DC
  Administered 2016-03-13: 200 ug via SUBCUTANEOUS

## 2016-03-13 NOTE — Telephone Encounter (Signed)
Tammy nurse with [facility] called with placement issues; social worker has been working on placement; [name] discharge has been delayed. [name] has agreed to take both [name] and [name] if [name] can have Rifaximin d/ced and lactulose replace that med. [facility] medical director is filling out FL2 and is willing to sub Lactulose if oked by [name]. This is time sensitive so [facility] will not give away bed. Request cb asap. [name] out of office and will send to [name].

## 2016-03-14 LAB — FRUCTOSAMINE: Fructosamine: 301 umol/L — ABNORMAL HIGH (ref 190–270)

## 2016-03-14 MED ORDER — LACTULOSE 20 GM/30ML PO SOLN: 20.0000 g | Freq: Two times a day (BID) | ORAL | Status: DC

## 2016-03-14 MED ORDER — LACTULOSE 20 GM/30ML PO SOLN: 20.0000 g | Freq: Every day | ORAL | Status: DC

## 2016-03-14 NOTE — Telephone Encounter (Addendum)
Noted rifaximin -> lactulose substitution request for placement purposes.  Will cc GI as fyi.

## 2016-03-14 NOTE — Telephone Encounter (Signed)
Thanks for letting me know, that should be fine as long as she is compliant with lactulose

## 2016-03-14 NOTE — Telephone Encounter (Signed)
Tammy notified and verbalized understanding.

## 2016-03-14 NOTE — Telephone Encounter (Signed)
This should be okay to substitute.  Med list updated.  Routed to PCP as FYI.

## 2016-03-15 DIAGNOSIS — M6281 Muscle weakness (generalized): Secondary | ICD-10-CM | POA: Diagnosis not present

## 2016-03-15 DIAGNOSIS — I5032 Chronic diastolic (congestive) heart failure: Secondary | ICD-10-CM | POA: Diagnosis not present

## 2016-03-15 DIAGNOSIS — R2681 Unsteadiness on feet: Secondary | ICD-10-CM | POA: Diagnosis not present

## 2016-03-15 DIAGNOSIS — R109 Unspecified abdominal pain: Secondary | ICD-10-CM | POA: Diagnosis not present

## 2016-03-15 DIAGNOSIS — E785 Hyperlipidemia, unspecified: Secondary | ICD-10-CM | POA: Diagnosis not present

## 2016-03-15 DIAGNOSIS — B370 Candidal stomatitis: Secondary | ICD-10-CM | POA: Diagnosis not present

## 2016-03-15 DIAGNOSIS — Q2733 Arteriovenous malformation of digestive system vessel: Secondary | ICD-10-CM | POA: Diagnosis not present

## 2016-03-15 DIAGNOSIS — N289 Disorder of kidney and ureter, unspecified: Secondary | ICD-10-CM | POA: Diagnosis not present

## 2016-03-15 DIAGNOSIS — J441 Chronic obstructive pulmonary disease with (acute) exacerbation: Secondary | ICD-10-CM | POA: Diagnosis not present

## 2016-03-15 DIAGNOSIS — I5030 Unspecified diastolic (congestive) heart failure: Secondary | ICD-10-CM | POA: Diagnosis not present

## 2016-03-15 DIAGNOSIS — K7460 Unspecified cirrhosis of liver: Secondary | ICD-10-CM | POA: Diagnosis not present

## 2016-03-15 DIAGNOSIS — R072 Precordial pain: Secondary | ICD-10-CM | POA: Diagnosis not present

## 2016-03-15 DIAGNOSIS — K219 Gastro-esophageal reflux disease without esophagitis: Secondary | ICD-10-CM | POA: Diagnosis not present

## 2016-03-15 DIAGNOSIS — G2581 Restless legs syndrome: Secondary | ICD-10-CM | POA: Diagnosis not present

## 2016-03-15 DIAGNOSIS — N390 Urinary tract infection, site not specified: Secondary | ICD-10-CM | POA: Diagnosis not present

## 2016-03-15 DIAGNOSIS — J449 Chronic obstructive pulmonary disease, unspecified: Secondary | ICD-10-CM | POA: Diagnosis not present

## 2016-03-15 DIAGNOSIS — Z87440 Personal history of urinary (tract) infections: Secondary | ICD-10-CM | POA: Diagnosis not present

## 2016-03-15 DIAGNOSIS — I2510 Atherosclerotic heart disease of native coronary artery without angina pectoris: Secondary | ICD-10-CM | POA: Diagnosis not present

## 2016-03-15 DIAGNOSIS — E119 Type 2 diabetes mellitus without complications: Secondary | ICD-10-CM | POA: Diagnosis not present

## 2016-03-15 DIAGNOSIS — I10 Essential (primary) hypertension: Secondary | ICD-10-CM | POA: Diagnosis not present

## 2016-03-15 DIAGNOSIS — R309 Painful micturition, unspecified: Secondary | ICD-10-CM | POA: Diagnosis not present

## 2016-03-15 DIAGNOSIS — D649 Anemia, unspecified: Secondary | ICD-10-CM | POA: Diagnosis not present

## 2016-03-18 ENCOUNTER — Encounter: Payer: Self-pay | Admitting: *Deleted

## 2016-03-18 DIAGNOSIS — B370 Candidal stomatitis: Secondary | ICD-10-CM | POA: Diagnosis not present

## 2016-03-19 DIAGNOSIS — E119 Type 2 diabetes mellitus without complications: Secondary | ICD-10-CM | POA: Diagnosis not present

## 2016-03-19 DIAGNOSIS — N390 Urinary tract infection, site not specified: Secondary | ICD-10-CM | POA: Diagnosis not present

## 2016-03-19 DIAGNOSIS — Z87440 Personal history of urinary (tract) infections: Secondary | ICD-10-CM | POA: Diagnosis not present

## 2016-03-19 DIAGNOSIS — R309 Painful micturition, unspecified: Secondary | ICD-10-CM | POA: Diagnosis not present

## 2016-03-25 ENCOUNTER — Encounter: Payer: Self-pay | Admitting: Pulmonary Disease

## 2016-03-25 ENCOUNTER — Ambulatory Visit (INDEPENDENT_AMBULATORY_CARE_PROVIDER_SITE_OTHER): Payer: Medicare Other | Admitting: Pulmonary Disease

## 2016-03-25 VITALS — BP 122/82 | HR 67 | Temp 98.5°F | Ht 62.5 in | Wt 202.8 lb

## 2016-03-25 DIAGNOSIS — K754 Autoimmune hepatitis: Secondary | ICD-10-CM

## 2016-03-25 DIAGNOSIS — K7469 Other cirrhosis of liver: Secondary | ICD-10-CM

## 2016-03-25 DIAGNOSIS — K31819 Angiodysplasia of stomach and duodenum without bleeding: Secondary | ICD-10-CM

## 2016-03-25 DIAGNOSIS — R5381 Other malaise: Secondary | ICD-10-CM

## 2016-03-25 DIAGNOSIS — I359 Nonrheumatic aortic valve disorder, unspecified: Secondary | ICD-10-CM

## 2016-03-25 DIAGNOSIS — I5032 Chronic diastolic (congestive) heart failure: Secondary | ICD-10-CM

## 2016-03-25 DIAGNOSIS — J9611 Chronic respiratory failure with hypoxia: Secondary | ICD-10-CM

## 2016-03-25 DIAGNOSIS — I10 Essential (primary) hypertension: Secondary | ICD-10-CM

## 2016-03-25 DIAGNOSIS — I2510 Atherosclerotic heart disease of native coronary artery without angina pectoris: Secondary | ICD-10-CM

## 2016-03-25 DIAGNOSIS — IMO0001 Reserved for inherently not codable concepts without codable children: Secondary | ICD-10-CM

## 2016-03-25 DIAGNOSIS — J449 Chronic obstructive pulmonary disease, unspecified: Secondary | ICD-10-CM

## 2016-03-25 DIAGNOSIS — D500 Iron deficiency anemia secondary to blood loss (chronic): Secondary | ICD-10-CM

## 2016-03-25 MED ORDER — IPRATROPIUM-ALBUTEROL 0.5-2.5 (3) MG/3ML IN SOLN: 3.0000 mL | Freq: Two times a day (BID) | RESPIRATORY_TRACT | 5 refills | Status: DC

## 2016-03-25 NOTE — Patient Instructions (Addendum)
Today we updated your med list in our EPIC system...    Continue your current medications the same...  For your BREATHING>>    Continue the Oxygen 3L/min 24/7...    Use the NEBULIZER w/ DUONEB twice daily at breakfast 7 dinner...    Use the ADVAIR 250- twice daily at lunch 7 bedtime...    Use the Proventil HFA rescue inhaler as needed for wheezing...    Use the MUCINEX 600mg  twice daily...  Continue your CARDIAC meds per DrCooper Harriett Sine...  Continue the medical regimen per DrGutierrez Harriett Sine...  Call for any questions.Marland KitchenMarland Kitchen

## 2016-03-25 NOTE — Progress Notes (Signed)
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 have not received [facility] notes & she NEVER brings meds to office despite our requests... ~  7/14: on RX per Psychiatry DrHejazi (we don't have notes from him) & she does not know her meds, didn't bring med bottles or med list!  As best we can tell she is on Nuvigil (on hold), Pristiq100, WellbutrinXL150, Seroquel100-4Qhs, Xanax54m prn- all from Psychiatry & she is reminded to bring all bottles to every visit...   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    Past Surgical History:  Procedure Laterality Date  . APPENDECTOMY    . APPENDECTOMY    . BALLOON DILATION  06/12/2012   Procedure: BALLOON DILATION;  Surgeon: DLafayette Dragon MD;  Location: [facility];  Service: Endoscopy;  Laterality: N/A;  ?balloon  . BREAST BIOPSY     bilateral  . CESAREAN SECTION     x 3  . ENTEROSCOPY N/A 02/25/2015   Procedure: ENTEROSCOPY;  Surgeon: PCarol Ada MD;  [facility];  Service: Endoscopy;   Laterality: N/A;  . ENTEROSCOPY N/A 06/27/2015   Procedure: ENTEROSCOPY;  Surgeon: SManus Gunning MD;  Location: [facility];  Service: Gastroenterology;  Laterality: N/A;  . ESOPHAGOGASTRODUODENOSCOPY  06/12/2012   Procedure: ESOPHAGOGASTRODUODENOSCOPY (EGD);  Surgeon: DLafayette Dragon MD;  [facility];  Service: Endoscopy;  Laterality: N/A;  . ESOPHAGOGASTRODUODENOSCOPY  09/10/2012   Procedure: ESOPHAGOGASTRODUODENOSCOPY (EGD);  Surgeon: DLafayette Dragon MD;  [facility];  Service: Endoscopy;  Laterality: N/A;  . ESOPHAGOGASTRODUODENOSCOPY N/A 01/18/2013   Procedure: ESOPHAGOGASTRODUODENOSCOPY (EGD);  Surgeon: DLafayette Dragon MD;  [facility];  Service: Endoscopy;  Laterality: N/A;  . ESOPHAGOGASTRODUODENOSCOPY N/A 05/24/2013   Procedure: ESOPHAGOGASTRODUODENOSCOPY (EGD);  Surgeon: JJerene Bears MD;  [place] [facility]  Service: Gastroenterology;  Laterality: N/A;  . ESOPHAGOGASTRODUODENOSCOPY N/A 10/20/2014   Procedure: ESOPHAGOGASTRODUODENOSCOPY (EGD);  Surgeon: DLafayette Dragon MD;  [facility];  Service: Endoscopy;  Laterality: N/A;  . EUS  09/2015   antral CAVL, ablated; nodular erosive gastritis; mult pancreatic cysts rec rpt CT pancreas protocol or MRI to survey pancreas cysts 1-2 yrs (Dr CAmie Critchley  . HEMORROIDECTOMY    . HOT HEMOSTASIS  09/10/2012   Procedure: HOT HEMOSTASIS (ARGON PLASMA COAGULATION/BICAP);  Surgeon: DLafayette Dragon MD;  [facility];  Service: Endoscopy;  Laterality: N/A;  . HOT HEMOSTASIS N/A 05/24/2013   Procedure: HOT HEMOSTASIS (ARGON PLASMA COAGULATION/BICAP);  Surgeon: JJerene Bears MD;  [place]  Service: G[place]ology;  Laterality: N/A;  . HOT HEMOSTASIS N/A 10/20/2014   Procedure: HOT HEMOSTASIS (ARGON PLASMA COAGULATION/BICAP);  Surgeon:  Lafayette Dragon, MD;  [facility];  Service: Endoscopy;  Laterality: N/A;  . US ECHOCARDIOGRAPHY  07/2014   mild LVH, EF 60-65%, normal wall  motion, mild AR, mod dilated LA, mildly dilated RA, peak PA pressure 93mHg  . VAGINAL HYSTERECTOMY      Outpatient Encounter Prescriptions as of 03/25/2016  Medication Sig Dispense Refill  . ADVAIR DISKUS 250-50 MCG/DOSE AEPB INHALE 1 PUFF INTO LUNGS TWICE A DAY 60 each 11  . albuterol (PROAIR HFA) 108 (90 BASE) MCG/ACT inhaler Inhale 2 puffs into the lungs every 6 (six) hours as needed for wheezing or shortness of breath. Reported on 08/18/2015    . ALPRAZolam (XANAX) 1 MG tablet TAKE 1/2 TO 1 TABLET TWICE A DAY AS NEEDED FOR ANXIETY AND SLEEP 60 tablet 0  . B Complex-C ([facility] B COMPLEX PLUS C) TABS TAKE ONE CAPSULE BY MOUTH EVERY DAY 30 tablet 6  . buPROPion (WELLBUTRIN XL) 150 MG 24 hr tablet Take 150 mg by mouth daily.    .Marland Kitchendesvenlafaxine (PRISTIQ) 50 MG 24 hr tablet Take 3 tablets (150 mg total) by mouth daily. 90 tablet 11  . diclofenac sodium (VOLTAREN) 1 % GEL Apply 1 application topically 3 (three) times daily. (Patient taking differently: Apply 1 application topically 3 (three) times daily as needed (pain). ) 1 Tube 1  . ferrous sulfate 325 (65 FE) MG tablet TAKE 1 TABLET BY MOUTH EVERY DAY WITH BREAKFAST 90 tablet 1  . glucose blood (ONE TOUCH ULTRA TEST) test strip Check blood sugar 4 times daily 200 each 3  . guaiFENesin (MUCINEX) 600 MG 12 hr tablet Take 600 mg by mouth 2 (two) times daily.     .Marland KitchenguaiFENesin-codeine 100-10 MG/5ML syrup Take 5 mLs by mouth 2 (two) times daily as needed for cough.   0  . Insulin Glargine (LANTUS SOLOSTAR) 100 UNIT/ML Solostar Pen Inject 120 Units into the skin every morning. (Patient taking differently: Inject 120 Units into the skin daily with breakfast. ) 20 pen 11  . insulin lispro (HUMALOG KWIKPEN) 100 UNIT/ML KiwkPen Inject 5-9 Units into the skin 2 (two) times daily as needed (CBG >300). Inject 5 units subcutaneously with lunch and supper for CBG >300, if CBG >350 at supper inject 8-9 units    . Insulin Pen Needle (B-D ULTRAFINE III SHORT PEN) 31G  X 8 MM MISC Use as directed to inject insulin twice daily. Dx: E11.8 200 each 2  . isosorbide mononitrate (IMDUR) 30 MG 24 hr tablet Take 0.5 tablets (15 mg total) by mouth daily. 30 tablet 6  . Lactulose 20 GM/30ML SOLN Take 30 mLs (20 g total) by mouth 2 (two) times daily. But skip one dose if diarrhea 30 mL   . levalbuterol (XOPENEX) 0.63 MG/3ML nebulizer solution Take 0.63 mg by nebulization every 8 (eight) hours as needed for wheezing or shortness of breath.   5  . loratadine (CLARITIN) 10 MG tablet TAKE 1 TABLET BY MOUTH EVERY DAY 30 tablet 11  . metolazone (ZAROXOLYN) 2.5 MG tablet TAKE AS DIRECTED FOR 4 LB WEIGHT GAIN (Patient taking differently: TAKE 1 TABLET BY MOUTH  FOR 4 LB WEIGHT GAIN IN 24 HOURS) 15 tablet 11  . Multiple Vitamin (MULTIVITAMIN WITH MINERALS) TABS tablet Take 1 tablet by mouth daily. Centrum Silver    . mupirocin cream (BACTROBAN) 2 % Apply 1 application topically 2 (two) times daily as needed (wound care). Reported on 11/14/2015    . nitroGLYCERIN (NITROSTAT) 0.4 MG SL tablet  Place 1 tablet (0.4 mg total) under the tongue every 5 (five) minutes as needed for chest pain. No more than 3 in 15 minutes 25 tablet 1  . nystatin (MYCOSTATIN) 100000 UNIT/ML suspension Take 5 mLs (500,000 Units total) by mouth 4 (four) times daily. X 7DAYS 60 mL 3  . ONETOUCH DELICA LANCETS 96E MISC USE TO CHECK SUGAR 4 TIMES DAILY AND AS NEEDED. XB:M84.13 **ONE TOUCH DELICA** 244 each 1  . oxyCODONE (OXY IR/ROXICODONE) 5 MG immediate release tablet Take 1 tablet (5 mg total) by mouth every 6 (six) hours as needed for moderate pain or severe pain. 60 tablet 0  . OXYGEN Inhale 3-4 L into the lungs continuous.    . pantoprazole (PROTONIX) 40 MG tablet TAKE 1 TABLET BY MOUTH TWICE A DAY 60 tablet 11  . Polyethyl Glycol-Propyl Glycol (SYSTANE OP) Place 1 drop into both eyes daily.    . potassium chloride SA (K-DUR,KLOR-CON) 20 MEQ tablet Take 40 mEq by mouth 2 (two) times daily.     . promethazine  (PHENERGAN) 25 MG tablet Take 25 mg by mouth 2 (two) times daily as needed for nausea or vomiting.    [name] QUEtiapine (SEROQUEL) 200 MG tablet TAKE 1 TABLET BY MOUTH AT BEDTIME 30 tablet 3  . rOPINIRole (REQUIP) 4 MG tablet TAKE 1 TABLET BY MOUTH AT BEDTIME 30 tablet 6  . simvastatin (ZOCOR) 40 MG tablet Take 40 mg by mouth at bedtime.     . sodium chloride (OCEAN) 0.65 % SOLN nasal spray Place 1 spray into both nostrils 2 (two) times daily as needed for congestion.    . torsemide (DEMADEX) 20 MG tablet Take 40 mg by mouth daily.    . Vitamin D, Ergocalciferol, (DRISDOL) 50000 units CAPS capsule Take 50,000 Units by mouth every Friday.   6  . ipratropium-albuterol (DUONEB) 0.5-2.5 (3) MG/3ML SOLN Take 3 mLs by nebulization 2 (two) times daily. 180 mL 5   No facility-administered encounter medications on file as of 03/25/2016.     Allergies  Allergen Reactions  . Acetaminophen Other (See Comments)    Liver dysfunction  . Nsaids Other (See Comments)    Liver dysfunction   . Aspirin Other (See Comments)    Causes nosebleeds  . Theophylline Nausea And Vomiting  . Penicillin G Itching  . Penicillins Itching    Has patient had a PCN reaction causing immediate rash, facial/tongue/throat swelling, SOB or lightheadedness with hypotension: unknown Has patient had a PCN reaction causing severe rash involving mucus membranes or skin necrosis: unknown Has patient had a PCN reaction that required hospitalization unknown Has patient had a PCN reaction occurring within the last 10 years: yes If all of the above answers are "NO", then may proceed with Cephalosporin use.     Current Medications, Allergies, Past Medical History, Past Surgical History, Family History, and Social History were reviewed in [employer] record.    Review of Systems         See HPI - all other systems neg except as noted...      The patient complains of weight gain, dyspnea on exertion, muscle weakness,  difficulty walking, and depression.  The patient denies anorexia, fever, weight loss, vision loss, decreased hearing, hoarseness, chest pain, syncope, peripheral edema, prolonged cough, headaches, hemoptysis, abdominal pain, melena, hematochezia, severe indigestion/heartburn, hematuria, incontinence, suspicious skin lesions, transient blindness, unusual weight change, abnormal bleeding, enlarged lymph nodes, and angioedema.     Objective:   Physical Exam  WD, Obese, 75 y/o WF in NAD... she is chr ill appearing... GENERAL:  Alert & oriented x 3... HEENT:  [place]/AT, pale conjuct, EOM-wnl, PERRLA, EACs-clear, TMs-wnl, NOSE-clear, THROAT-clear w/ dry MMs... NECK:  Supple w/ fairROM; no JVD; normal carotid impulses w/o bruits; no thyromegaly or nodules palpated; no lymphadenopathy. CHEST:  Clear to P & A; decr BS bilat without wheezes/ rales/ or rhonchi heard... HEART:  Regular Rhythm;  gr 1-2/6 SEM without rubs or gallops... ABDOMEN:  Obese, soft & nontender w/ panniculus; normal bowel sounds; no organomegaly or masses detected. EXT: without deformities, mod arthritic changes; no varicose veins/ +venous insuffic/ tr edema. NEURO:  CN's intact;  no focal neuro deficits... DERM:  No lesions noted; no rash, pale complexion.  RADIOLOGY DATA:  Reviewed in the EPIC EMR & discussed w/ the patient...  LABORATORY DATA:  Reviewed in the EPIC EMR & discussed w/ the patient...   Assessment & Plan:    DYSPNEA> clearly this is multifactorial w/ components from Lung dis, CHF, Obesity, inactivity, Anemia, etc; a large component is likely related to anxiety & chest "tightness" for which she has Xanax 64m from her psychiatrists...  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  HBP>  Controlled on low dose BBlocker plus diuretic> continue same & work on weight reduction...  CAD, DiastolicCHF>  Known nonobstructive dis, way too sedentary> we discussed diet + exercise...  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  CHOL>  Stable on the Simva40...  DM>  Managed by DrEllison on Lantus insulin currently...  OBESITY>  Reviewed diet + exercise needed...  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  Autoimmune hepatitis>  Also managed by DrBrodie, off the Immuran at present... She also has pancytopenia related to her cirrhosis.  RLS>  Notes from DrDohmeier reviewed> pt is off Sinemet now...  Dysthymia>  followed by [facility] on mult meds...  Anemia>  [facility] DrDBrodie for GI, DrGutierrez for Primary..   Patient's Medications  New Prescriptions   IPRATROPIUM-ALBUTEROL (DUONEB) 0.5-2.5 (3) MG/3ML SOLN    Take 3 mLs by nebulization 2 (two) times daily.  Previous Medications   ADVAIR DISKUS 250-50 MCG/DOSE AEPB    INHALE 1 PUFF INTO LUNGS TWICE A DAY   ALBUTEROL (PROAIR HFA) 108 (90 BASE) MCG/ACT INHALER    Inhale 2 puffs into the lungs every 6 (six) hours as needed for wheezing or shortness of breath. Reported on 08/18/2015   ALPRAZOLAM (XANAX) 1 MG TABLET    TAKE 1/2 TO 1 TABLET TWICE A DAY AS NEEDED FOR ANXIETY AND SLEEP   B COMPLEX-C ([employer] B COMPLEX PLUS C) TABS    TAKE ONE CAPSULE BY MOUTH EVERY DAY   BUPROPION (WELLBUTRIN XL) 150 MG 24 HR TABLET    Take 150 mg by mouth daily.   DESVENLAFAXINE (PRISTIQ) 50 MG 24 HR TABLET    Take 3 tablets (150 mg total) by mouth daily.   DICLOFENAC SODIUM (VOLTAREN) 1 % GEL    Apply 1 application topically 3 (three) times daily.   FERROUS SULFATE 325 (65 FE) MG TABLET    TAKE 1 TABLET BY MOUTH  EVERY DAY WITH BREAKFAST   GLUCOSE BLOOD (ONE TOUCH ULTRA TEST) TEST  STRIP    Check blood sugar 4 times daily   GUAIFENESIN (MUCINEX) 600 MG 12 HR TABLET    Take 600 mg by mouth 2 (two) times daily.    GUAIFENESIN-CODEINE 100-10 MG/5ML SYRUP    Take 5 mLs by mouth 2 (two) times daily as needed for cough.    INSULIN GLARGINE (LANTUS SOLOSTAR) 100 UNIT/ML SOLOSTAR PEN    Inject 120 Units into the skin every morning.   INSULIN LISPRO (HUMALOG KWIKPEN) 100 UNIT/ML KIWKPEN    Inject 5-9 Units into the skin 2 (two) times daily as needed (CBG >300). Inject 5 units subcutaneously with lunch and supper for CBG >300, if CBG >350 at supper inject 8-9 units   INSULIN PEN NEEDLE (B-D ULTRAFINE III SHORT PEN) 31G X 8 MM MISC    Use as directed to inject insulin twice daily. Dx: E11.8   ISOSORBIDE MONONITRATE (IMDUR) 30 MG 24 HR TABLET    Take 0.5 tablets (15 mg total) by mouth daily.   LACTULOSE 20 GM/30ML SOLN    Take 30 mLs (20 g total) by mouth 2 (two) times daily. But skip one dose if diarrhea   LEVALBUTEROL (XOPENEX) 0.63 MG/3ML NEBULIZER SOLUTION    Take 0.63 mg by nebulization every 8 (eight) hours as needed for wheezing or shortness of breath.    LORATADINE (CLARITIN) 10 MG TABLET    TAKE 1 TABLET BY MOUTH EVERY DAY   METOLAZONE (ZAROXOLYN) 2.5 MG TABLET    TAKE AS DIRECTED FOR 4 LB WEIGHT GAIN   MULTIPLE VITAMIN (MULTIVITAMIN WITH MINERALS) TABS TABLET    Take 1 tablet by mouth daily. Centrum Silver   MUPIROCIN CREAM (BACTROBAN) 2 %    Apply 1 application topically 2 (two) times daily as needed (wound care). Reported on 11/14/2015   NITROGLYCERIN (NITROSTAT) 0.4 MG SL TABLET    Place 1 tablet (0.4 mg total) under the tongue every 5 (five) minutes as needed for chest pain. No more than 3 in 15 minutes   NYSTATIN (MYCOSTATIN) 100000 UNIT/ML SUSPENSION    Take 5 mLs (500,000 Units total) by mouth 4 (four) times daily. X 7DAYS   ONETOUCH DELICA LANCETS 62V MISC    USE TO CHECK SUGAR 4 TIMES DAILY AND AS NEEDED. OJ:J00.93 **ONE TOUCH DELICA**   OXYCODONE (OXY IR/ROXICODONE) 5 MG  IMMEDIATE RELEASE TABLET    Take 1 tablet (5 mg total) by mouth every 6 (six) hours as needed for moderate pain or severe pain.   OXYGEN    Inhale 3-4 L into the lungs continuous.   PANTOPRAZOLE (PROTONIX) 40 MG TABLET    TAKE 1 TABLET BY MOUTH TWICE A DAY   POLYETHYL GLYCOL-PROPYL GLYCOL (SYSTANE OP)    Place 1 drop into both eyes daily.   POTASSIUM CHLORIDE SA (K-DUR,KLOR-CON) 20 MEQ TABLET    Take 40 mEq by mouth 2 (two) times daily.    PROMETHAZINE (PHENERGAN) 25 MG TABLET    Take 25 mg by mouth 2 (two) times daily as needed for nausea or vomiting.   QUETIAPINE (SEROQUEL) 200 MG TABLET    TAKE 1 TABLET BY MOUTH AT BEDTIME   ROPINIROLE (REQUIP) 4 MG TABLET    TAKE 1 TABLET BY MOUTH AT BEDTIME   SIMVASTATIN (ZOCOR) 40 MG TABLET    Take 40 mg by mouth at bedtime.    SODIUM CHLORIDE (OCEAN) 0.65 % SOLN NASAL SPRAY    Place  1 spray into both nostrils 2 (two) times daily as needed for congestion.   TORSEMIDE (DEMADEX) 20 MG TABLET    Take 40 mg by mouth daily.   VITAMIN D, ERGOCALCIFEROL, (DRISDOL) 50000 UNITS CAPS CAPSULE    Take 50,000 Units by mouth every Friday.   Modified Medications   No medications on file  Discontinued Medications   No medications on file

## 2016-03-27 ENCOUNTER — Encounter (HOSPITAL_COMMUNITY)
Admission: RE | Admit: 2016-03-27 | Discharge: 2016-03-27 | Disposition: A | Discharge status: Home / Self Care | Payer: Medicare Other | Source: Ambulatory Visit | Attending: Pulmonary Disease | Admitting: Pulmonary Disease

## 2016-03-27 DIAGNOSIS — N289 Disorder of kidney and ureter, unspecified: Secondary | ICD-10-CM | POA: Insufficient documentation

## 2016-03-27 DIAGNOSIS — K7460 Unspecified cirrhosis of liver: Secondary | ICD-10-CM | POA: Insufficient documentation

## 2016-03-27 DIAGNOSIS — D649 Anemia, unspecified: Secondary | ICD-10-CM | POA: Insufficient documentation

## 2016-03-27 DIAGNOSIS — Q2733 Arteriovenous malformation of digestive system vessel: Secondary | ICD-10-CM | POA: Diagnosis not present

## 2016-03-27 DIAGNOSIS — E1122 Type 2 diabetes mellitus with diabetic chronic kidney disease: Secondary | ICD-10-CM

## 2016-03-27 DIAGNOSIS — N183 Chronic kidney disease, stage 3 (moderate): Secondary | ICD-10-CM

## 2016-03-27 LAB — POCT HEMOGLOBIN-HEMACUE: HEMOGLOBIN: 10.2 g/dL — AB (ref 12.0–15.0)

## 2016-03-27 MED ORDER — DARBEPOETIN ALFA 200 MCG/0.4ML IJ SOSY: 200.0000 ug | PREFILLED_SYRINGE | INTRAMUSCULAR | Status: DC

## 2016-03-31 NOTE — Progress Notes (Signed)
Cardiology Office Note:    Date:  04/01/2016   ID:  [name], DOB 12-30-1940, MRN [id number]  PCP:  Ria Bush, MD  Cardiologist:  Dr. Sherren Mocha   Electrophysiologist:  n/a GI: Dr. Havery Moros Pulmonologist: Dr. Lenna Gilford  Referring MD: Ria Bush, MD   Chief Complaint  Patient presents with  . Congestive Heart Failure    follow up  . Hospitalization Follow-up    admit 7/17 with dyspnea   History of Present Illness:    [name] is a 75 y.o. 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 The patient has had a significant problem with worsening anemia and is followed closely by GI. She has required periodic iron infusions as well as Aranesp injections every 2 weeks to maintain her hemoglobin. She has required admission to the hospital in the past with worsening anemia requiring transfusion with PRBCs. Prior EGD demonstrated GAVE and she has undergone multiple APC ablations of AVMs. She has been referred to gastroenterology at [facility] for more specialized treatment of her AVMs.  She is DNR/DNI.      She has had several admissions this year with acute on chronic respiratory failure related to COPD and CHF.  Last seen by Dr. Burt Knack in 6/17. She was then admitted 7/7-7/9 with worsening dyspnea. It was felt that her dyspnea was likely related to worsening anemia. FOBT was negative. She received 2 units PRBCs. She did not require diuresis. She returns for follow-up.    Her husband had a severe car accident several weeks ago and spent several weeks in the hospital. He required nursing home placement after discharge. She now lives with him at [facility] She is here today with her daughter. She is fairly stable. She denies significant changes in her  breathing. She sleeps on several pillows chronically. She denies PND. She denies LE edema. Her weights are stable. She denies syncope. She continues to have occasional episodes of chest discomfort. This tends to occur with emotional stress and is relieved by nitroglycerin. The pattern has not changed or worsened.  Prior CV studies that were reviewed today include:    Echo 11/02/15 Moderate concentric LVH, EF 60-65%, normal wall motion, grade 2 diastolic dysfunction, mild aortic stenosis, mild AI (mean 12 mmHg), MAC, no mitral stenosis, severe LAE, mild RAE, trivial TR, trivial PI   Echo 08/29/15 Mild LVH, EF 55-60%, normal wall motion, grade 2 diastolic dysfunction, mild AS (mean 8 mmHg), mild to moderate AI, MAC, mild MS (mean 4 mmHg), mild MR, moderate LAE, mild RAE, moderate TR, PASP 51 mmHg   Echo 01/2013:  EF 55-60%, Gr 2 DD, very mild AS (mean 9 mmHg), mild AI, mild BAE, PASP 39.   Echo 08/14/14 Mild LVH, EF 60-65%, normal wall motion, mild AI, MAC, moderate LAE, mild RAE, PASP 39 mmHg   LHC 11/2007:  Dx 20%, proximal and mid RCA 20-30%, EF 50-55%.    Past Medical History:  Diagnosis Date  . Adenomatous colon polyp   . Anxiety   . Asthma   . Benign neoplasm of colon   . CAP (community acquired pneumonia) 2015  . Carpal tunnel syndrome on both sides   . Chronic airway obstruction, not elsewhere classified   . Chronic diastolic CHF (congestive heart failure) ([place])    a. 03/26/2014 Echo:  EF 55-60%, no rwma, mild AS/AI, mod-sev Ca2+ MV annulus, mildly to mod dil LA.  [name] Coarse tremors    a. arms.  . Diverticulosis of colon (without mention of hemorrhage)   . Esophageal reflux   . Falls frequently    completed HHPT/OT 06/2014, PT unmet goals  . GAVE (gastric antral vascular ectasia)    angiodysplasia; s/p multiple APCs, discussing RFA with [facility] Dr Newman Pies (06/2015)  . H/O hiatal hernia   . Hepatitis   . Hiatal hernia   . Iron deficiency anemia secondary to blood loss  (chronic)    a. frequent PRBC transfusions.  . Liver cirrhosis secondary to nonalcoholic steatohepatitis (NASH)    a. dx'd 1990's  . Major depressive disorder, recurrent episode, severe, specified as with psychotic behavior   . Midsternal chest pain    a. conservatively managed ->poor candidate for cath/anticoagulation.  . Mild aortic stenosis    a. 03/2014 Valve area (VTI): 2.89 cm^2, Valve area (Vmax): 2.7 cm^2.  . Morbid obesity ([place])   . Non-obstructive CAD   . On home oxygen therapy    "3L; 24/7" (11/29/2015)  . OSA (obstructive sleep apnea)    a. does not use CPAP. (01/25/2015)  . Osteoarthrosis, unspecified whether generalized or localized, unspecified site   . Osteoporosis, unspecified   . Palpitations   . Portal hypertensive gastropathy   . Protein calorie malnutrition ([place])   . Pure hypercholesterolemia   . Recurrent UTI (urinary tract infection)    h/o hospitalization with urosepsis 2015, but thought large component colonization/bacteriuria, only treat if symptomatic ([place])  . Restless legs syndrome (RLS)   . Thrombocytopenia ([place])   . Type II diabetes mellitus ([place])   . Unspecified chronic bronchitis ([place])   . Unspecified essential hypertension     Past Surgical History:  Procedure Laterality Date  . APPENDECTOMY    . APPENDECTOMY    . BALLOON DILATION  06/12/2012   Procedure: BALLOON DILATION;  Surgeon: Lafayette Dragon, MD;  Location: [facility];  Service: Endoscopy;  Laterality: N/A;  ?balloon  . BREAST BIOPSY     bilateral  . CESAREAN SECTION     x 3  . ENTEROSCOPY N/A 02/25/2015   Procedure: ENTEROSCOPY;  Surgeon: [name] Ada, MD;  Location: [facility] END[facility]COPY;  Service: Endoscopy;  Laterality: N/A;  . ENTEROSCOPY N/A 06/27/2015   Procedure: ENTEROSCOPY;  Surgeon: Manus Gunning, MD;  Location: [facility];  Service: Gastroenterology;  Laterality: N/A;  . ESOPHAGOGASTRODUODENOSCOPY  06/12/2012   Procedure: ESOPHAGOGASTRODUODENOSCOPY (EGD);  Surgeon: Lafayette Dragon, MD;  Location: [facility];  Service: Endoscopy;  Laterality: N/A;  . ESOPHAGOGASTRODUODENOSCOPY  09/10/2012   Procedure: ESOPHAGOGASTRODUODENOSCOPY (EGD);  Surgeon: Lafayette Dragon, MD;  Location: [facility];  Service: Endoscopy;  Laterality: N/A;  . ESOPHAGOGASTRODUODENOSCOPY N/A 01/18/2013   Procedure: ESOPHAGOGASTRODUODENOSCOPY (EGD);  Surgeon: Lafayette Dragon, MD;  Location: [facility] ENDOSCOPY;  Service: Endoscopy;  Laterality: N/A;  . ESOPHAGOGASTRODUODENOSCOPY N/A 05/24/2013   Procedure: ESOPHAGOGASTRODUODENOSCOPY (EGD);  Surgeon: Jerene Bears, MD;  Location: [place];  Service: Gastroenterology;  Laterality: N/A;  . ESOPHAGOGASTRODUODENOSCOPY N/A 10/20/2014   Procedure: ESOPHAGOGASTRODUODENOSCOPY (EGD);  Surgeon: Lafayette Dragon, MD;  Location: [facility];  Service: Endoscopy;  Laterality: N/A;  . EUS  09/2015   antral CAVL, ablated; nodular erosive gastritis; mult pancreatic cysts rec rpt CT pancreas protocol or MRI to survey pancreas cysts 1-2 yrs (Dr Amie Critchley)  . HEMORROIDECTOMY    . HOT HEMOSTASIS  09/10/2012   Procedure:  HOT HEMOSTASIS (ARGON PLASMA COAGULATION/BICAP);  Surgeon: Lafayette Dragon, MD;  Location: [facility];  Service: Endoscopy;  Laterality: N/A;  . HOT HEMOSTASIS N/A 05/24/2013   Procedure: HOT HEMOSTASIS (ARGON PLASMA COAGULATION/BICAP);  Surgeon: Jerene Bears, MD;  Location: [place];  Service: Gastroenterology;  Laterality: N/A;  . HOT HEMOSTASIS N/A 10/20/2014   Procedure: HOT HEMOSTASIS (ARGON PLASMA COAGULATION/BICAP);  Surgeon: Lafayette Dragon, MD;  Location: [facility];  Service: Endoscopy;  Laterality: N/A;  . US ECHOCARDIOGRAPHY  07/2014   mild LVH, EF 60-65%, normal wall motion, mild AR, mod dilated LA, mildly dilated RA, peak PA pressure 32mmHg  . VAGINAL HYSTERECTOMY      Current Medications: Current Meds  Medication Sig  . ADVAIR DISKUS 250-50 MCG/DOSE AEPB INHALE 1 PUFF INTO LUNGS TWICE A DAY  . albuterol (PROAIR HFA) 108 (90 BASE) MCG/ACT  inhaler Inhale 2 puffs into the lungs every 6 (six) hours as needed for wheezing or shortness of breath. Reported on 08/18/2015  . ALPRAZolam (XANAX) 1 MG tablet TAKE 1/2 TO 1 TABLET TWICE A DAY AS NEEDED FOR ANXIETY AND SLEEP  . B Complex-C ([employer] B COMPLEX PLUS C) TABS TAKE ONE CAPSULE BY MOUTH EVERY DAY  . buPROPion (WELLBUTRIN XL) 150 MG 24 hr tablet Take 150 mg by mouth daily.  [name] desvenlafaxine (PRISTIQ) 50 MG 24 hr tablet Take 3 tablets (150 mg total) by mouth daily.  . diclofenac sodium (VOLTAREN) 1 % GEL Apply topically 3 (three) times daily as needed (pain).  . ferrous sulfate 325 (65 FE) MG tablet TAKE 1 TABLET BY MOUTH EVERY DAY WITH BREAKFAST  . glucose blood (ONE TOUCH ULTRA TEST) test strip Check blood sugar 4 times daily  . guaiFENesin (MUCINEX) 600 MG 12 hr tablet Take 600 mg by mouth 2 (two) times daily.   [name] guaiFENesin-codeine 100-10 MG/5ML syrup Take 5 mLs by mouth 2 (two) times daily as needed for cough.   . Insulin Glargine (LANTUS SOLOSTAR) 100 UNIT/ML Solostar Pen Inject 60 Units into the skin every morning.  . insulin lispro (HUMALOG KWIKPEN) 100 UNIT/ML KiwkPen Inject 5-9 Units into the skin 2 (two) times daily as needed (CBG >300). Inject 5 units subcutaneously with lunch and supper for CBG >300, if CBG >350 at supper inject 8-9 units  . Insulin Pen Needle (B-D ULTRAFINE III SHORT PEN) 31G X 8 MM MISC Use as directed to inject insulin twice daily. Dx: E11.8  . ipratropium-albuterol (DUONEB) 0.5-2.5 (3) MG/3ML SOLN Take 3 mLs by nebulization 2 (two) times daily.  . isosorbide mononitrate (IMDUR) 30 MG 24 hr tablet Take 1 tablet (30 mg total) by mouth daily.  . Lactulose 20 GM/30ML SOLN Take 30 mLs (20 g total) by mouth 2 (two) times daily. But skip one dose if diarrhea  . levalbuterol (XOPENEX) 0.63 MG/3ML nebulizer solution Take 0.63 mg by nebulization every 8 (eight) hours as needed for wheezing or shortness of breath.   . loratadine (CLARITIN) 10 MG tablet TAKE 1 TABLET BY  MOUTH EVERY DAY  . metolazone (ZAROXOLYN) 2.5 MG tablet daily. TAKE 1 TABLET BY MOUTH  FOR 4 LB WEIGHT GAIN IN 24 HOURS  . Multiple Vitamin (MULTIVITAMIN WITH MINERALS) TABS tablet Take 1 tablet by mouth daily. Centrum Silver  . mupirocin cream (BACTROBAN) 2 % Apply 1 application topically 2 (two) times daily as needed (wound care). Reported on 11/14/2015  . nitroGLYCERIN (NITROSTAT) 0.4 MG SL tablet Place 1 tablet (0.4 mg total) under the tongue every 5 (five)  minutes as needed for chest pain. No more than 3 in 15 minutes  . nystatin (MYCOSTATIN) 100000 UNIT/ML suspension Take 5 mLs (500,000 Units total) by mouth 4 (four) times daily. X 7DAYS  . ONETOUCH DELICA LANCETS 99991111 MISC USE TO CHECK SUGAR 4 TIMES DAILY AND AS NEEDED. 123456 **ONE TOUCH DELICA**  . oxyCODONE (OXY IR/ROXICODONE) 5 MG immediate release tablet Take 1 tablet (5 mg total) by mouth every 6 (six) hours as needed for moderate pain or severe pain.  . OXYGEN Inhale 3-4 L into the lungs continuous.  . pantoprazole (PROTONIX) 40 MG tablet TAKE 1 TABLET BY MOUTH TWICE A DAY  . Polyethyl Glycol-Propyl Glycol (SYSTANE OP) Place 1 drop into both eyes daily.  . potassium chloride SA (K-DUR,KLOR-CON) 20 MEQ tablet Take 40 mEq by mouth 2 (two) times daily.   . promethazine (PHENERGAN) 25 MG tablet Take 25 mg by mouth 2 (two) times daily as needed for nausea or vomiting.  [name] QUEtiapine (SEROQUEL) 200 MG tablet TAKE 1 TABLET BY MOUTH AT BEDTIME  . rOPINIRole (REQUIP) 4 MG tablet TAKE 1 TABLET BY MOUTH AT BEDTIME  . simvastatin (ZOCOR) 40 MG tablet Take 40 mg by mouth at bedtime.   . sodium chloride (OCEAN) 0.65 % SOLN nasal spray Place 1 spray into both nostrils 2 (two) times daily as needed for congestion.  . torsemide (DEMADEX) 20 MG tablet Take 40 mg by mouth daily.  . Vitamin D, Ergocalciferol, (DRISDOL) 50000 units CAPS capsule Take 50,000 Units by mouth every Friday.   . [DISCONTINUED] isosorbide mononitrate (IMDUR) 30 MG 24 hr tablet  Take 0.5 tablets (15 mg total) by mouth daily.     Allergies:   Acetaminophen; Nsaids; Aspirin; Theophylline; Penicillin g; and Penicillins   Social History   Social History  . Marital status: Married    Spouse name: jerry Anastasia  . Number of children: 4  . Years of education: 12   Occupational History  . Disabled   .  Retired   Social History Main Topics  . Smoking status: Former Smoker    Packs/day: 1.50    Years: 25.00    Types: Cigarettes    Quit date: 08/26/1992  . Smokeless tobacco: Never Used  . Alcohol use No     Comment: [id number] "last alcoholic drink was years ago"  . Drug use: No  . Sexual activity: No   Other Topics Concern  . None   Social History Narrative   Patient lives at home with her husband [name]).    Retired.   Caffeine- None    Right handed.      Dr. Olevia Perches now [facility] (referred to [facility] at [facility])   Dr. Burt Knack cards    Dr. Loanne Drilling endo   Dr Lenna Gilford pulm   Psych: Followed by [facility] (ph [phone], fax [phone]).      Family History:  The patient's family history includes Breast cancer in her maternal aunt and sister; Cervical cancer in her mother; Diabetes in her father, mother, and sister; Heart attack in her father and mother; Heart disease in her mother; Kidney disease in her mother; Multiple sclerosis in her other and sister; Stroke in her daughter and daughter.   ROS:   Please see the history of present illness.    ROS All other systems reviewed and are negative.   EKGs/Labs/Other Test Reviewed:    EKG:  EKG is not ordered today.  The ekg ordered today demonstrates n/a ECG from 03/03/16 demonstrated sinus  bradycardia with a heart rate of 53, first-degree AV block with PR interval of 256 ms, nonspecific ST-T wave changes, QTc 431 ms, no change from previous tracings  Recent Labs: 08/28/2015: TSH 1.411 09/01/2015: Magnesium 2.2 11/21/2015: Pro B Natriuretic peptide (BNP) 72.0 03/01/2016: ALT 29; B Natriuretic Peptide  87.5 03/08/2016: BUN 44; Creat 1.14; Platelets 89; Potassium 3.6; Sodium 144 03/27/2016: Hemoglobin 10.2   Recent Lipid Panel    Component Value Date/Time   CHOL 115 12/13/2014 0902   TRIG 113.0 12/13/2014 0902   HDL 40.50 12/13/2014 0902   CHOLHDL 3 12/13/2014 0902   VLDL 22.6 12/13/2014 0902   LDLCALC 52 12/13/2014 0902    Physical Exam:    VS:  BP 130/70   Pulse (!) 58   Ht 5' 2.5" (1.588 m)   Wt 204 lb 12.8 oz (92.9 kg)   SpO2 96%   BMI 36.86 kg/m     Wt Readings from Last 3 Encounters:  04/01/16 204 lb 12.8 oz (92.9 kg)  03/25/16 202 lb 12.8 oz (92 kg)  03/08/16 205 lb (93 kg)     Physical Exam  Constitutional: She is oriented to person, place, and time. She appears well-developed and well-nourished.  HENT:  Head: Normocephalic and atraumatic.  Eyes: No scleral icterus.  Neck: Normal range of motion. No JVD present.  Cardiovascular: Normal rate, regular rhythm, S1 normal and S2 normal.  Exam reveals no gallop and no friction rub.   Murmur heard.  Harsh crescendo-decrescendo systolic murmur is present with a grade of 2/6  at the upper right sternal border Pulmonary/Chest: Effort normal. She has decreased breath sounds. She has no wheezes. She has no rhonchi. She has no rales.  Abdominal: Soft. There is no tenderness.  Musculoskeletal: She exhibits no edema.  Neurological: She is alert and oriented to person, place, and time.  Skin: Skin is warm and dry.  Psychiatric: She has a normal mood and affect.    ASSESSMENT:    1. Chronic diastolic heart failure ([place])   2. Precordial pain   3. Essential hypertension   4. Coronary artery disease involving native coronary artery of native heart without angina pectoris   5. Iron deficiency anemia due to chronic blood loss   6. Chronic obstructive pulmonary disease, unspecified COPD, unspecified chronic bronchitis type    PLAN:    In order of problems listed above:  1. Chronic diastolic CHF (congestive heart failure) -   From a volume standpoint, she is fairly stable. Her dry weight was ~ 210-211.  She is currently 205 pounds. Continue current regimen which includes metolazone when necessary and torsemide 40 mg daily.  SCr stable 03/08/16 - 1.14.  2. Chest pain - Somewhat atypical for angina.  She has had some response to NTG in the past.  Chronic anemia likely contributes to her symptoms.  She is not a candidate for aggressive/invasive cardiac evaluation.  She should be treated conservatively.  BP can tolerate increasing her nitrates.  Increase Imdur to 30 mg QD.    3. Essential hypertension - BP controlled.       4. CAD - Non-obstructive by LHC in the past.  She is not a candidate for aspirin therapy secondary to bleeding. Continue statin.  Continue nitrates.      5. Anemia, iron deficiency:  Secondary to GAVE (gastric antral vascular ectasia).  FU with GI.  Recent admit with worsening dyspnea thought to be 2/2 symptomatic anemia - she is s/p transfusion with PRBCs x 2.  Recent Hgb 10.2.    6. COPD - FU with Pulmonology as planned.   Medication Adjustments/Labs and Tests Ordered: Current medicines are reviewed at length with the patient today.  Concerns regarding medicines are outlined above.  Medication changes, Labs and Tests ordered today are outlined in the Patient Instructions noted below. Patient Instructions  Medication Instructions: Your physician has recommended you make the following change in your medication: 1. INCREASE IMDUR TO 1 TABLET (30 MG) BY MOUTH DAILY Labwork: NONE   Procedures/Testing: NONE Follow-Up: Your physician recommends that you schedule a follow-up appointment on July 12 2016 at 8:00 with Dr. Burt Knack Any Additional Special Instructions Will Be Listed Below (If Applicable). If you need a refill on your cardiac medications before your next appointment, please call your pharmacy.   Signed, Richardson Dopp, PA-C  04/01/2016 8:58 AM    [facility] [address], [place], [place]  [address] Phone: [phone]; Fax: [phone]

## 2016-04-01 ENCOUNTER — Ambulatory Visit (INDEPENDENT_AMBULATORY_CARE_PROVIDER_SITE_OTHER): Payer: Medicare Other | Admitting: Physician Assistant

## 2016-04-01 ENCOUNTER — Encounter (INDEPENDENT_AMBULATORY_CARE_PROVIDER_SITE_OTHER): Payer: Self-pay

## 2016-04-01 ENCOUNTER — Ambulatory Visit: Payer: Medicare Other | Admitting: Physician Assistant

## 2016-04-01 ENCOUNTER — Encounter: Payer: Self-pay | Admitting: Physician Assistant

## 2016-04-01 VITALS — BP 130/70 | HR 58 | Ht 62.5 in | Wt 204.8 lb

## 2016-04-01 DIAGNOSIS — R072 Precordial pain: Secondary | ICD-10-CM | POA: Diagnosis not present

## 2016-04-01 DIAGNOSIS — I10 Essential (primary) hypertension: Secondary | ICD-10-CM

## 2016-04-01 DIAGNOSIS — I2510 Atherosclerotic heart disease of native coronary artery without angina pectoris: Secondary | ICD-10-CM | POA: Diagnosis not present

## 2016-04-01 DIAGNOSIS — D500 Iron deficiency anemia secondary to blood loss (chronic): Secondary | ICD-10-CM

## 2016-04-01 DIAGNOSIS — J449 Chronic obstructive pulmonary disease, unspecified: Secondary | ICD-10-CM

## 2016-04-01 DIAGNOSIS — I5032 Chronic diastolic (congestive) heart failure: Secondary | ICD-10-CM | POA: Diagnosis not present

## 2016-04-01 MED ORDER — ISOSORBIDE MONONITRATE ER 30 MG PO TB24: 30.0000 mg | ORAL_TABLET | Freq: Every day | ORAL | 6 refills | Status: DC

## 2016-04-01 NOTE — Patient Instructions (Addendum)
Medication Instructions: Your physician has recommended you make the following change in your medication: 1. INCREASE IMDUR TO 1 TABLET (30 MG) BY MOUTH DAILY Labwork: NONE   Procedures/Testing: NONE Follow-Up: Your physician recommends that you schedule a follow-up appointment on July 12 2016 at 8:00 with Dr. Burt Knack Any Additional Special Instructions Will Be Listed Below (If Applicable). If you need a refill on your cardiac medications before your next appointment, please call your pharmacy.

## 2016-04-05 ENCOUNTER — Telehealth: Payer: Self-pay | Admitting: Family Medicine

## 2016-04-05 DIAGNOSIS — I5030 Unspecified diastolic (congestive) heart failure: Secondary | ICD-10-CM | POA: Diagnosis not present

## 2016-04-05 DIAGNOSIS — M6281 Muscle weakness (generalized): Secondary | ICD-10-CM | POA: Diagnosis not present

## 2016-04-05 DIAGNOSIS — R2681 Unsteadiness on feet: Secondary | ICD-10-CM | POA: Diagnosis not present

## 2016-04-05 DIAGNOSIS — J441 Chronic obstructive pulmonary disease with (acute) exacerbation: Secondary | ICD-10-CM | POA: Diagnosis not present

## 2016-04-05 NOTE — Telephone Encounter (Signed)
Pt called. She is at [facility] [address], [place], [place]). She has had stomach pains for 3 days now. Pt said she is impacted. They gave her an enema about 930am this morning and she is having little relief.   She had a few bowel movements with bright red blood. Pt doesn't know if it is her liver?? She is in a lot of pain. Please advise on what else she can do.  [phone]

## 2016-04-05 NOTE — Telephone Encounter (Signed)
I'm sorry she's not feeling well. Recommend she touch base with SNF physician as they are actively taking care of her and he will be the one to order med changes. In interim, increase water and ensure good fiber in diet (fruits/vegetables and grains).

## 2016-04-05 NOTE — Telephone Encounter (Signed)
Patient notified and verbalized understanding. 

## 2016-04-07 ENCOUNTER — Emergency Department (HOSPITAL_COMMUNITY): Payer: Medicare Other

## 2016-04-07 ENCOUNTER — Encounter (HOSPITAL_COMMUNITY): Payer: Self-pay

## 2016-04-07 ENCOUNTER — Emergency Department (HOSPITAL_COMMUNITY)
Admission: EM | Admit: 2016-04-07 | Discharge: 2016-04-08 | Disposition: A | Discharge status: Home / Self Care | Payer: Medicare Other | Attending: Emergency Medicine | Admitting: Emergency Medicine

## 2016-04-07 DIAGNOSIS — Z794 Long term (current) use of insulin: Secondary | ICD-10-CM | POA: Diagnosis not present

## 2016-04-07 DIAGNOSIS — R1030 Lower abdominal pain, unspecified: Secondary | ICD-10-CM | POA: Diagnosis not present

## 2016-04-07 DIAGNOSIS — N183 Chronic kidney disease, stage 3 (moderate): Secondary | ICD-10-CM | POA: Insufficient documentation

## 2016-04-07 DIAGNOSIS — K5900 Constipation, unspecified: Secondary | ICD-10-CM

## 2016-04-07 DIAGNOSIS — R109 Unspecified abdominal pain: Secondary | ICD-10-CM | POA: Diagnosis not present

## 2016-04-07 DIAGNOSIS — J449 Chronic obstructive pulmonary disease, unspecified: Secondary | ICD-10-CM | POA: Insufficient documentation

## 2016-04-07 DIAGNOSIS — Z79899 Other long term (current) drug therapy: Secondary | ICD-10-CM | POA: Diagnosis not present

## 2016-04-07 DIAGNOSIS — Z87891 Personal history of nicotine dependence: Secondary | ICD-10-CM | POA: Insufficient documentation

## 2016-04-07 DIAGNOSIS — I5032 Chronic diastolic (congestive) heart failure: Secondary | ICD-10-CM | POA: Insufficient documentation

## 2016-04-07 DIAGNOSIS — I130 Hypertensive heart and chronic kidney disease with heart failure and stage 1 through stage 4 chronic kidney disease, or unspecified chronic kidney disease: Secondary | ICD-10-CM | POA: Insufficient documentation

## 2016-04-07 DIAGNOSIS — E1122 Type 2 diabetes mellitus with diabetic chronic kidney disease: Secondary | ICD-10-CM | POA: Diagnosis not present

## 2016-04-07 DIAGNOSIS — D649 Anemia, unspecified: Secondary | ICD-10-CM | POA: Diagnosis not present

## 2016-04-07 LAB — COMPREHENSIVE METABOLIC PANEL
ALBUMIN: 3.5 g/dL (ref 3.5–5.0)
ALT: 24 U/L (ref 14–54)
AST: 27 U/L (ref 15–41)
Alkaline Phosphatase: 136 U/L — ABNORMAL HIGH (ref 38–126)
Anion gap: 9 (ref 5–15)
BUN: 21 mg/dL — AB (ref 6–20)
CHLORIDE: 99 mmol/L — AB (ref 101–111)
CO2: 25 mmol/L (ref 22–32)
CREATININE: 1.26 mg/dL — AB (ref 0.44–1.00)
Calcium: 8.8 mg/dL — ABNORMAL LOW (ref 8.9–10.3)
GFR calc Af Amer: 48 mL/min — ABNORMAL LOW (ref >60)
GFR calc non Af Amer: 42 mL/min — ABNORMAL LOW (ref >60)
GLUCOSE: 161 mg/dL — AB (ref 65–99)
POTASSIUM: 3.6 mmol/L (ref 3.5–5.1)
Sodium: 133 mmol/L — ABNORMAL LOW (ref 135–145)
Total Bilirubin: 2.2 mg/dL — ABNORMAL HIGH (ref 0.3–1.2)
Total Protein: 6.4 g/dL — ABNORMAL LOW (ref 6.5–8.1)

## 2016-04-07 LAB — CBC WITH DIFFERENTIAL/PLATELET
Basophils Absolute: 0 10*3/uL (ref 0.0–0.1)
Basophils Relative: 0 %
Eosinophils Absolute: 0.2 10*3/uL (ref 0.0–0.7)
Eosinophils Relative: 2 %
HEMATOCRIT: 29.9 % — AB (ref 36.0–46.0)
Hemoglobin: 9.9 g/dL — ABNORMAL LOW (ref 12.0–15.0)
LYMPHS ABS: 0.6 10*3/uL — AB (ref 0.7–4.0)
LYMPHS PCT: 8 %
MCH: 31.9 pg (ref 26.0–34.0)
MCHC: 33.1 g/dL (ref 30.0–36.0)
MCV: 96.5 fL (ref 78.0–100.0)
MONO ABS: 0.6 10*3/uL (ref 0.1–1.0)
MONOS PCT: 9 %
NEUTROS ABS: 5.5 10*3/uL (ref 1.7–7.7)
Neutrophils Relative %: 80 %
Platelets: 102 10*3/uL — ABNORMAL LOW (ref 150–400)
RBC: 3.1 MIL/uL — ABNORMAL LOW (ref 3.87–5.11)
RDW: 16.6 % — AB (ref 11.5–15.5)
WBC: 6.9 10*3/uL (ref 4.0–10.5)

## 2016-04-07 LAB — URINALYSIS, ROUTINE W REFLEX MICROSCOPIC
Bilirubin Urine: NEGATIVE
Glucose, UA: NEGATIVE mg/dL
KETONES UR: NEGATIVE mg/dL
LEUKOCYTES UA: NEGATIVE
NITRITE: NEGATIVE
PH: 6 (ref 5.0–8.0)
PROTEIN: 30 mg/dL — AB
Specific Gravity, Urine: 1.01 (ref 1.005–1.030)

## 2016-04-07 LAB — URINE MICROSCOPIC-ADD ON

## 2016-04-07 LAB — LIPASE, BLOOD: Lipase: 18 U/L (ref 11–51)

## 2016-04-07 MED ORDER — DICYCLOMINE HCL 20 MG PO TABS: 20.0000 mg | ORAL_TABLET | Freq: Two times a day (BID) | ORAL | 0 refills | Status: AC

## 2016-04-07 MED ORDER — HYDROMORPHONE HCL 1 MG/ML IJ SOLN
1.0000 mg | Freq: Once | INTRAMUSCULAR | Status: AC
  Administered 2016-04-07: 1 mg via INTRAVENOUS
  Filled 2016-04-07: qty 1

## 2016-04-07 MED ORDER — POLYETHYLENE GLYCOL 3350 17 G PO PACK: 17.0000 g | PACK | Freq: Every day | ORAL | 0 refills | Status: DC

## 2016-04-07 MED ORDER — ONDANSETRON HCL 4 MG/2ML IJ SOLN
4.0000 mg | Freq: Once | INTRAMUSCULAR | Status: AC
  Administered 2016-04-07: 4 mg via INTRAVENOUS
  Filled 2016-04-07: qty 2

## 2016-04-07 NOTE — ED Notes (Signed)
Called ptar, pt transport is still pending at this time

## 2016-04-07 NOTE — Discharge Instructions (Signed)
Take bentyl 2 times daily for spasm. Take miralax for 2 days to help with constipation. If symptoms worsen please follow up with pcp. Try taking a stool softener with your pain medicine to help with constipation.
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
## 2016-04-07 NOTE — ED Triage Notes (Signed)
BIB [facility] c/o of Abd PAIN AND SOME FLANK PAIN that started 3 days ago and having trouble urinating. From [facility]. THey took an x-ray and said she was impacted and administered 2 enemas. Pt. sts there was blood the toilet paper

## 2016-04-07 NOTE — ED Notes (Signed)
Patient given Bag meal.

## 2016-04-07 NOTE — ED Notes (Signed)
Called report to [employer] @ [facility].

## 2016-04-08 DIAGNOSIS — J441 Chronic obstructive pulmonary disease with (acute) exacerbation: Secondary | ICD-10-CM | POA: Diagnosis not present

## 2016-04-08 DIAGNOSIS — R05 Cough: Secondary | ICD-10-CM | POA: Diagnosis not present

## 2016-04-08 DIAGNOSIS — K5900 Constipation, unspecified: Secondary | ICD-10-CM | POA: Diagnosis not present

## 2016-04-08 DIAGNOSIS — R0602 Shortness of breath: Secondary | ICD-10-CM | POA: Diagnosis not present

## 2016-04-08 DIAGNOSIS — R1084 Generalized abdominal pain: Secondary | ICD-10-CM | POA: Diagnosis not present

## 2016-04-08 MED ORDER — OXYCODONE HCL 5 MG PO TABS
5.0000 mg | ORAL_TABLET | Freq: Four times a day (QID) | ORAL | Status: DC | PRN
  Administered 2016-04-08: 5 mg via ORAL
  Filled 2016-04-08: qty 1

## 2016-04-10 ENCOUNTER — Inpatient Hospital Stay (HOSPITAL_COMMUNITY)
Admission: RE | Admit: 2016-04-10 | Discharge: 2016-04-10 | Disposition: A | Discharge status: Home / Self Care | Payer: Medicare Other | Source: Ambulatory Visit | Attending: Family Medicine | Admitting: Family Medicine

## 2016-04-12 DIAGNOSIS — I5030 Unspecified diastolic (congestive) heart failure: Secondary | ICD-10-CM | POA: Diagnosis not present

## 2016-04-12 DIAGNOSIS — R0789 Other chest pain: Secondary | ICD-10-CM | POA: Diagnosis not present

## 2016-04-12 DIAGNOSIS — J441 Chronic obstructive pulmonary disease with (acute) exacerbation: Secondary | ICD-10-CM | POA: Diagnosis not present

## 2016-04-12 DIAGNOSIS — E119 Type 2 diabetes mellitus without complications: Secondary | ICD-10-CM | POA: Diagnosis not present

## 2016-04-19 ENCOUNTER — Telehealth: Payer: Self-pay | Admitting: Family Medicine

## 2016-04-19 NOTE — Telephone Encounter (Signed)
Called pt - needs to shedule awv before 09/30 Left message

## 2016-04-25 ENCOUNTER — Telehealth: Payer: Self-pay

## 2016-04-25 DIAGNOSIS — M6281 Muscle weakness (generalized): Secondary | ICD-10-CM | POA: Diagnosis not present

## 2016-04-25 DIAGNOSIS — D649 Anemia, unspecified: Secondary | ICD-10-CM | POA: Diagnosis not present

## 2016-04-25 DIAGNOSIS — N189 Chronic kidney disease, unspecified: Secondary | ICD-10-CM | POA: Diagnosis not present

## 2016-04-25 NOTE — Telephone Encounter (Signed)
FYI: [place], Erline Levine, called to obtain orders for Aranesp injections and HGB check so they could perform at their facility rather than having her daughter take her to clinic.  They will have their SNF facility physician follow from here but needed the most current R/x orders to initiate therapy.  Faxed copy of the R/X order signed by Dr. Danise Mina on 04/09/2016 to [place], received confirmation of receipt.

## 2016-04-26 DIAGNOSIS — M6281 Muscle weakness (generalized): Secondary | ICD-10-CM | POA: Diagnosis not present

## 2016-04-26 DIAGNOSIS — D649 Anemia, unspecified: Secondary | ICD-10-CM | POA: Diagnosis not present

## 2016-05-04 DIAGNOSIS — J449 Chronic obstructive pulmonary disease, unspecified: Secondary | ICD-10-CM | POA: Diagnosis not present

## 2016-05-08 DIAGNOSIS — D649 Anemia, unspecified: Secondary | ICD-10-CM | POA: Diagnosis not present

## 2016-05-08 DIAGNOSIS — K7460 Unspecified cirrhosis of liver: Secondary | ICD-10-CM | POA: Diagnosis not present

## 2016-05-08 DIAGNOSIS — I5030 Unspecified diastolic (congestive) heart failure: Secondary | ICD-10-CM | POA: Diagnosis not present

## 2016-05-08 DIAGNOSIS — J441 Chronic obstructive pulmonary disease with (acute) exacerbation: Secondary | ICD-10-CM | POA: Diagnosis not present

## 2016-05-13 ENCOUNTER — Ambulatory Visit: Payer: Medicare Other | Admitting: Endocrinology

## 2016-05-13 DIAGNOSIS — Z0289 Encounter for other administrative examinations: Secondary | ICD-10-CM

## 2016-05-14 DIAGNOSIS — J441 Chronic obstructive pulmonary disease with (acute) exacerbation: Secondary | ICD-10-CM | POA: Diagnosis not present

## 2016-05-14 DIAGNOSIS — M6281 Muscle weakness (generalized): Secondary | ICD-10-CM | POA: Diagnosis not present

## 2016-05-15 DIAGNOSIS — M6281 Muscle weakness (generalized): Secondary | ICD-10-CM | POA: Diagnosis not present

## 2016-05-15 DIAGNOSIS — J441 Chronic obstructive pulmonary disease with (acute) exacerbation: Secondary | ICD-10-CM | POA: Diagnosis not present

## 2016-05-17 DIAGNOSIS — J441 Chronic obstructive pulmonary disease with (acute) exacerbation: Secondary | ICD-10-CM | POA: Diagnosis not present

## 2016-05-17 DIAGNOSIS — M6281 Muscle weakness (generalized): Secondary | ICD-10-CM | POA: Diagnosis not present

## 2016-05-18 DIAGNOSIS — M6281 Muscle weakness (generalized): Secondary | ICD-10-CM | POA: Diagnosis not present

## 2016-05-18 DIAGNOSIS — J441 Chronic obstructive pulmonary disease with (acute) exacerbation: Secondary | ICD-10-CM | POA: Diagnosis not present

## 2016-05-19 DIAGNOSIS — M6281 Muscle weakness (generalized): Secondary | ICD-10-CM | POA: Diagnosis not present

## 2016-05-19 DIAGNOSIS — J441 Chronic obstructive pulmonary disease with (acute) exacerbation: Secondary | ICD-10-CM | POA: Diagnosis not present

## 2016-05-20 DIAGNOSIS — M6281 Muscle weakness (generalized): Secondary | ICD-10-CM | POA: Diagnosis not present

## 2016-05-20 DIAGNOSIS — J441 Chronic obstructive pulmonary disease with (acute) exacerbation: Secondary | ICD-10-CM | POA: Diagnosis not present

## 2016-05-21 DIAGNOSIS — J441 Chronic obstructive pulmonary disease with (acute) exacerbation: Secondary | ICD-10-CM | POA: Diagnosis not present

## 2016-05-21 DIAGNOSIS — M6281 Muscle weakness (generalized): Secondary | ICD-10-CM | POA: Diagnosis not present

## 2016-05-22 DIAGNOSIS — J441 Chronic obstructive pulmonary disease with (acute) exacerbation: Secondary | ICD-10-CM | POA: Diagnosis not present

## 2016-05-22 DIAGNOSIS — M6281 Muscle weakness (generalized): Secondary | ICD-10-CM | POA: Diagnosis not present

## 2016-05-23 DIAGNOSIS — M6281 Muscle weakness (generalized): Secondary | ICD-10-CM | POA: Diagnosis not present

## 2016-05-23 DIAGNOSIS — J441 Chronic obstructive pulmonary disease with (acute) exacerbation: Secondary | ICD-10-CM | POA: Diagnosis not present

## 2016-05-26 DIAGNOSIS — M6281 Muscle weakness (generalized): Secondary | ICD-10-CM | POA: Diagnosis not present

## 2016-05-26 DIAGNOSIS — J441 Chronic obstructive pulmonary disease with (acute) exacerbation: Secondary | ICD-10-CM | POA: Diagnosis not present

## 2016-05-27 DIAGNOSIS — J441 Chronic obstructive pulmonary disease with (acute) exacerbation: Secondary | ICD-10-CM | POA: Diagnosis not present

## 2016-05-27 DIAGNOSIS — M6281 Muscle weakness (generalized): Secondary | ICD-10-CM | POA: Diagnosis not present

## 2016-05-30 DIAGNOSIS — M6281 Muscle weakness (generalized): Secondary | ICD-10-CM | POA: Diagnosis not present

## 2016-05-30 DIAGNOSIS — J441 Chronic obstructive pulmonary disease with (acute) exacerbation: Secondary | ICD-10-CM | POA: Diagnosis not present

## 2016-05-30 DIAGNOSIS — I110 Hypertensive heart disease with heart failure: Secondary | ICD-10-CM | POA: Diagnosis not present

## 2016-05-30 DIAGNOSIS — R2681 Unsteadiness on feet: Secondary | ICD-10-CM | POA: Diagnosis not present

## 2016-05-31 DIAGNOSIS — M545 Low back pain: Secondary | ICD-10-CM | POA: Diagnosis not present

## 2016-05-31 DIAGNOSIS — R109 Unspecified abdominal pain: Secondary | ICD-10-CM | POA: Diagnosis not present

## 2016-05-31 DIAGNOSIS — M6281 Muscle weakness (generalized): Secondary | ICD-10-CM | POA: Diagnosis not present

## 2016-05-31 DIAGNOSIS — R42 Dizziness and giddiness: Secondary | ICD-10-CM | POA: Diagnosis not present

## 2016-06-01 DIAGNOSIS — N390 Urinary tract infection, site not specified: Secondary | ICD-10-CM | POA: Diagnosis not present

## 2016-06-03 DIAGNOSIS — D649 Anemia, unspecified: Secondary | ICD-10-CM | POA: Diagnosis not present

## 2016-06-03 DIAGNOSIS — J449 Chronic obstructive pulmonary disease, unspecified: Secondary | ICD-10-CM | POA: Diagnosis not present

## 2016-06-05 DIAGNOSIS — D649 Anemia, unspecified: Secondary | ICD-10-CM | POA: Diagnosis not present

## 2016-06-06 DIAGNOSIS — D649 Anemia, unspecified: Secondary | ICD-10-CM | POA: Diagnosis not present

## 2016-06-07 DIAGNOSIS — Z23 Encounter for immunization: Secondary | ICD-10-CM | POA: Diagnosis not present

## 2016-06-07 DIAGNOSIS — J441 Chronic obstructive pulmonary disease with (acute) exacerbation: Secondary | ICD-10-CM | POA: Diagnosis not present

## 2016-06-11 DIAGNOSIS — I5030 Unspecified diastolic (congestive) heart failure: Secondary | ICD-10-CM | POA: Diagnosis not present

## 2016-06-11 DIAGNOSIS — E119 Type 2 diabetes mellitus without complications: Secondary | ICD-10-CM | POA: Diagnosis not present

## 2016-06-11 DIAGNOSIS — M545 Low back pain: Secondary | ICD-10-CM | POA: Diagnosis not present

## 2016-06-11 DIAGNOSIS — R52 Pain, unspecified: Secondary | ICD-10-CM | POA: Diagnosis not present

## 2016-06-12 DIAGNOSIS — D649 Anemia, unspecified: Secondary | ICD-10-CM | POA: Diagnosis not present

## 2016-06-19 DIAGNOSIS — D649 Anemia, unspecified: Secondary | ICD-10-CM | POA: Diagnosis not present

## 2016-07-08 ENCOUNTER — Ambulatory Visit
Admission: RE | Admit: 2016-07-08 | Discharge: 2016-07-08 | Disposition: A | Discharge status: Home / Self Care | Payer: Medicare Other | Source: Ambulatory Visit | Attending: Neurology | Admitting: Neurology

## 2016-07-08 ENCOUNTER — Ambulatory Visit (INDEPENDENT_AMBULATORY_CARE_PROVIDER_SITE_OTHER): Payer: Medicare Other | Admitting: Neurology

## 2016-07-08 ENCOUNTER — Encounter: Payer: Self-pay | Admitting: Neurology

## 2016-07-08 ENCOUNTER — Other Ambulatory Visit: Payer: Self-pay | Admitting: Neurology

## 2016-07-08 VITALS — BP 116/53 | HR 69

## 2016-07-08 DIAGNOSIS — M549 Dorsalgia, unspecified: Secondary | ICD-10-CM

## 2016-07-08 DIAGNOSIS — R269 Unspecified abnormalities of gait and mobility: Secondary | ICD-10-CM

## 2016-07-08 DIAGNOSIS — M542 Cervicalgia: Secondary | ICD-10-CM

## 2016-07-08 NOTE — Patient Instructions (Signed)
[facility] WebsiteDirections   [facility] in [place], [place]  [place]7 mi Address: [address], [place], [place] [address] Phone: [phone]
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
## 2016-07-09 ENCOUNTER — Telehealth: Payer: Self-pay | Admitting: Neurology

## 2016-07-09 NOTE — Telephone Encounter (Signed)
Please call patient, x-ray of lumbar and thoracic spine showed mild degenerative disc disease  MRI of cervical spine showed more severe multilevel degenerative disc disease, radiology has recommended MRI of cervical spine, if she continue have significant neck pain, I may proceed.   Grade 1 anterolisthesis of C4 with respect to C5 of uncertain age and etiology. Significant multilevel facet joint hypertrophy from approximately C5 inferiorly. Degenerative disc space narrowing at C5-6 and C6-7.  Given the patient's symptoms, cervical spine MRI is recommended. If there are strong clinical concerns of an acute fracture, CT scanning would be a preferable first cross-sectional imaging step.

## 2016-07-09 NOTE — Telephone Encounter (Signed)
Left message for her daughter, Nada Libman (on HIPAA & POA), to return my call.

## 2016-07-10 ENCOUNTER — Encounter: Payer: Self-pay | Admitting: Neurology

## 2016-07-10 ENCOUNTER — Other Ambulatory Visit: Payer: Self-pay | Admitting: *Deleted

## 2016-07-10 DIAGNOSIS — M5030 Other cervical disc degeneration, unspecified cervical region: Secondary | ICD-10-CM

## 2016-07-10 DIAGNOSIS — M542 Cervicalgia: Secondary | ICD-10-CM

## 2016-07-10 NOTE — Telephone Encounter (Signed)
Spoke to patient's daughter, [name] - she is aware of the x-ray results.  States her mother is still having significant neck pain and she would like to proceed with a MRI cervical w/o.  Per Dr. Krista Blue, place orders and send for approval/scheduling.

## 2016-07-10 NOTE — Telephone Encounter (Signed)
The primary number listed is for her daughter, [name].  I have called her again and left another message.  She is the only one listed on her HIPPA.

## 2016-07-10 NOTE — Telephone Encounter (Signed)
[name] who is the patient's daughter is returning your call. She can be reached at [phone].

## 2016-07-12 ENCOUNTER — Ambulatory Visit: Payer: Medicare Other | Admitting: Cardiovascular Disease

## 2016-07-26 ENCOUNTER — Inpatient Hospital Stay: Admission: RE | Admit: 2016-07-26 | Payer: Medicare Other | Source: Ambulatory Visit

## 2016-08-30 ENCOUNTER — Other Ambulatory Visit: Payer: Self-pay

## 2016-08-30 DIAGNOSIS — K7460 Unspecified cirrhosis of liver: Secondary | ICD-10-CM

## 2016-08-30 DIAGNOSIS — K7581 Nonalcoholic steatohepatitis (NASH): Principal | ICD-10-CM

## 2016-08-30 NOTE — Progress Notes (Signed)
Letter mailed

## 2016-09-17 ENCOUNTER — Telehealth: Payer: Self-pay

## 2016-09-17 NOTE — Telephone Encounter (Signed)
[name] nurse with [facility] left v/m; last visit was back in March and has made several attempts to contact pt with no success. Inez Catalina wants to d/c care of pt. Please advise.

## 2016-09-17 NOTE — Telephone Encounter (Signed)
I have not followed patient recently either - rec she touch base with new PCP Dr Garwin Brothers, I believe pt is now at [facility].

## 2016-09-18 NOTE — Telephone Encounter (Signed)
Message left advising [name].

## 2016-10-01 ENCOUNTER — Ambulatory Visit (HOSPITAL_COMMUNITY)
Admission: RE | Admit: 2016-10-01 | Discharge: 2016-10-01 | Disposition: A | Discharge status: Home / Self Care | Payer: Medicare Other | Source: Ambulatory Visit | Attending: Gastroenterology | Admitting: Gastroenterology

## 2016-10-01 ENCOUNTER — Other Ambulatory Visit: Payer: Self-pay | Admitting: Gastroenterology

## 2016-10-01 DIAGNOSIS — K8689 Other specified diseases of pancreas: Secondary | ICD-10-CM | POA: Insufficient documentation

## 2016-10-01 DIAGNOSIS — K7581 Nonalcoholic steatohepatitis (NASH): Principal | ICD-10-CM

## 2016-10-01 DIAGNOSIS — R161 Splenomegaly, not elsewhere classified: Secondary | ICD-10-CM | POA: Diagnosis not present

## 2016-10-01 DIAGNOSIS — K7460 Unspecified cirrhosis of liver: Secondary | ICD-10-CM

## 2016-10-01 DIAGNOSIS — K766 Portal hypertension: Secondary | ICD-10-CM | POA: Insufficient documentation

## 2016-10-01 DIAGNOSIS — I8500 Esophageal varices without bleeding: Secondary | ICD-10-CM | POA: Insufficient documentation

## 2016-10-01 LAB — POCT I-STAT CREATININE: Creatinine, Ser: 1.3 mg/dL — ABNORMAL HIGH (ref 0.44–1.00)

## 2016-10-01 MED ORDER — GADOBENATE DIMEGLUMINE 529 MG/ML IV SOLN
20.0000 mL | Freq: Once | INTRAVENOUS | Status: AC | PRN
  Administered 2016-10-01: 19 mL via INTRAVENOUS

## 2016-10-10 ENCOUNTER — Ambulatory Visit: Payer: Medicare Other | Admitting: Neurology

## 2016-10-10 ENCOUNTER — Ambulatory Visit: Payer: Medicare Other | Admitting: Gastroenterology

## 2016-11-04 ENCOUNTER — Ambulatory Visit (INDEPENDENT_AMBULATORY_CARE_PROVIDER_SITE_OTHER): Payer: Medicare Other | Admitting: Gastroenterology

## 2016-11-04 ENCOUNTER — Encounter: Payer: Self-pay | Admitting: Gastroenterology

## 2016-11-04 VITALS — BP 140/60 | HR 68 | Wt 175.4 lb

## 2016-11-04 DIAGNOSIS — K7581 Nonalcoholic steatohepatitis (NASH): Secondary | ICD-10-CM | POA: Diagnosis not present

## 2016-11-04 DIAGNOSIS — D490 Neoplasm of unspecified behavior of digestive system: Secondary | ICD-10-CM | POA: Diagnosis not present

## 2016-11-04 DIAGNOSIS — D500 Iron deficiency anemia secondary to blood loss (chronic): Secondary | ICD-10-CM | POA: Diagnosis not present

## 2016-11-04 DIAGNOSIS — K7460 Unspecified cirrhosis of liver: Secondary | ICD-10-CM

## 2016-11-04 DIAGNOSIS — K31819 Angiodysplasia of stomach and duodenum without bleeding: Secondary | ICD-10-CM

## 2016-11-04 NOTE — Patient Instructions (Signed)
We have asked that you have some labs drawn at your facility.  AFP

## 2016-11-04 NOTE — Progress Notes (Signed)
HPI :  76 year old female with multiple medical problems as outlined below, here for follow-up with me regarding the following issues.  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 She had this done in February 2017. Since having this done her anemia has been improved. She previously had multiple transfusions and since February 2017 she's had one transfusion, occurring in July 2017. She says she's been on Procrit and iron supplements as well. Her last hemoglobin on record with [place] was in the nines, she states her last was done at her nursing home and was in the eights, I don't have records of this today.  She also had an EUS with [facility] in February 2017 for abnormal pancreas imaging. 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 She's also had a history of dilated PD on MRCP.   She reports her anemia has been stable. Her Hgb has been elevated to 10s back in August. Her Hgb reportedly at her nursing home has been in thre 8s-9s reportedly and on procrit and on iron therapy as well. Last blood transfusion July 2017.   She takes protonix 40mg  once daily.  Follow up MRCP done 10/01/2016 - advanced cirrhosis, suggestive for presence of esophageal varices, stable findings of the pancreas compared to prior imaging on 9/28 with dilated PD and atrophy of the tail but no worrisome lesions of the pancreas or liver.   EGD 09/29/15 - no esophageal varices   Past Medical History:  Diagnosis Date  . Adenomatous colon polyp   . Anxiety   . Asthma   . Back pain   . Benign neoplasm of colon   . CAP (community acquired pneumonia) 2015  . Carpal tunnel syndrome on both sides   . Chronic airway obstruction, not elsewhere classified   . Chronic diastolic CHF (congestive heart failure) ([place])    a. 03/26/2014 Echo: EF 55-60%, no rwma, mild AS/AI, mod-sev  Ca2+ MV annulus, mildly to mod dil LA.  [name] Coarse tremors    a. arms.  . Diverticulosis of colon (without mention of hemorrhage)   . Esophageal reflux   . Falls frequently    completed HHPT/OT 06/2014, PT unmet goals  . GAVE (gastric antral vascular ectasia)    angiodysplasia; s/p multiple APCs, discussing RFA with [facility] Dr Newman Pies (06/2015)  . H/O hiatal hernia   . Hepatitis   . Hiatal hernia   . Iron deficiency anemia secondary to blood loss (chronic)    a. frequent PRBC transfusions.  . Liver cirrhosis secondary to nonalcoholic steatohepatitis (NASH) ([place])    a. dx'd 1990's  . Major depressive disorder, recurrent episode, severe, specified as with psychotic behavior   . Memory loss   . Midsternal chest pain    a. conservatively managed ->poor candidate for cath/anticoagulation.  . Mild aortic stenosis    a. 03/2014 Valve area (VTI): 2.89 cm^2, Valve area (Vmax): 2.7 cm^2.  . Morbid obesity ([place])   . Neck pain   . Non-obstructive CAD   . On home oxygen therapy    "3L; 24/7" (11/29/2015)  . OSA (obstructive sleep apnea)    a. does not use CPAP. (01/25/2015)  . Osteoarthrosis, unspecified whether generalized or localized, unspecified site   . Osteoporosis, unspecified   . Palpitations   . Portal hypertensive gastropathy   . Protein calorie malnutrition ([place])   .  Pure hypercholesterolemia   . Recurrent UTI (urinary tract infection)    h/o hospitalization with urosepsis 2015, but thought large component colonization/bacteriuria, only treat if symptomatic ([place])  . Restless legs syndrome (RLS)   . Thrombocytopenia ([place])   . Type II diabetes mellitus ([place])   . Unspecified chronic bronchitis   . Unspecified essential hypertension      Past Surgical History:  Procedure Laterality Date  . APPENDECTOMY    . APPENDECTOMY    . BALLOON DILATION  06/12/2012   Procedure: BALLOON DILATION;  Surgeon: Lafayette Dragon, MD;  Location: [facility] ENDOSCOPY;  Service: Endoscopy;  Laterality:  N/A;  ?balloon  . BREAST BIOPSY     bilateral  . CESAREAN SECTION     x 3  . ENTEROSCOPY N/A 02/25/2015   Procedure: ENTEROSCOPY;  Surgeon: [name] Ada, MD;  Location: [facility] ENDOSCOPY;  Service: Endoscopy;  Laterality: N/A;  . ENTEROSCOPY N/A 06/27/2015   Procedure: ENTEROSCOPY;  Surgeon: Manus Gunning, MD;  Location: [facility] ENDOSCOPY;  Service: Gastroenterology;  Laterality: N/A;  . ESOPHAGOGASTRODUODENOSCOPY  06/12/2012   Procedure: ESOPHAGOGASTRODUODENOSCOPY (EGD);  Surgeon: Lafayette Dragon, MD;  Location: [facility];  Service: Endoscopy;  Laterality: N/A;  . ESOPHAGOGASTRODUODENOSCOPY  09/10/2012   Procedure: ESOPHAGOGASTRODUODENOSCOPY (EGD);  Surgeon: Lafayette Dragon, MD;  Location: [facility]OS[facility];  Service: Endoscopy;  Laterality: N/A;  . ESOPHAGOGASTRODUODENOSCOPY N/A 01/18/2013   Procedure: ESOPHAGOGASTRODUODENOSCOPY (EGD);  Surgeon: Lafayette Dragon, MD;  Location: [facility] ENDOSCOPY;  Service: Endoscopy;  Laterality: N/A;  . ESOPHAGOGASTRODUODENOSCOPY N/A 05/24/2013   Procedure: ESOPHAGOGASTRODUODENOSCOPY (EGD);  Surgeon: Jerene Bears, MD;  Location: [place];  Service: Gastroenterology;  Laterality: N/A;  . ESOPHAGOGASTRODUODENOSCOPY N/A 10/20/2014   Procedure: ESOPHAGOGASTRODUODENOSCOPY (EGD);  Surgeon: Lafayette Dragon, MD;  Location: [facility] ENDOSCOPY;  Service: Endoscopy;  Laterality: N/A;  . EUS  09/2015   antral CAVL, ablated; nodular erosive gastritis; mult pancreatic cysts rec rpt CT pancreas protocol or MRI to survey pancreas cysts 1-2 yrs (Dr Amie Critchley)  . HEMORROIDECTOMY    . HOT HEMOSTASIS  09/10/2012   Procedure: HOT HEMOSTASIS (ARGON PLASMA COAGULATION/BICAP);  Surgeon: Lafayette Dragon, MD;  Location: [facility];  Service: Endoscopy;  Laterality: N/A;  . HOT HEMOSTASIS N/A 05/24/2013   Procedure: HOT HEMOSTASIS (ARGON PLASMA COAGULATION/BICAP);  Surgeon: Jerene Bears, MD;  Location: [place];  Service: Gastroenterology;  Laterality: N/A;  . HOT HEMOSTASIS N/A 10/20/2014   Procedure:  HOT HEMOSTASIS (ARGON PLASMA COAGULATION/BICAP);  Surgeon: Lafayette Dragon, MD;  Location: [facility];  Service: Endoscopy;  Laterality: N/A;  . US ECHOCARDIOGRAPHY  07/2014   mild LVH, EF 60-65%, normal wall motion, mild AR, mod dilated LA, mildly dilated RA, peak PA pressure 37mmHg  . VAGINAL HYSTERECTOMY     Family History  Problem Relation Age of Onset  . Heart disease Mother   . Cervical cancer Mother   . Kidney disease Mother   . Diabetes Mother   . Heart attack Mother   . Diabetes Father   . Heart attack Father   . Multiple sclerosis Other   . Breast cancer Sister   . Multiple sclerosis Sister   . Breast cancer Maternal Aunt     x 2  . Diabetes Sister   . Stroke Daughter   . Stroke Daughter   . Lupus Daughter   . Colon cancer Neg Hx    Social History  Substance Use Topics  . Smoking status: Former Smoker    Packs/day: 1.50    Years:  25.00    Types: Cigarettes    Quit date: 08/26/1992  . Smokeless tobacco: Never Used  . Alcohol use No     Comment: 09/22/76 "last alcoholic drink was years ago"   Current Outpatient Prescriptions  Medication Sig Dispense Refill  . clobetasol cream (TEMOVATE) 6.76 % Apply 1 application topically 2 (two) times daily.    . ferrous sulfate 325 (65 FE) MG tablet TAKE 1 TABLET BY MOUTH EVERY DAY WITH BREAKFAST 90 tablet 1  . fluconazole (DIFLUCAN) 150 MG tablet Take 150 mg by mouth as needed.    . Multiple Vitamin (MULTIVITAMIN WITH MINERALS) TABS tablet Take 1 tablet by mouth daily. Centrum Silver    . pantoprazole (PROTONIX) 40 MG tablet TAKE 1 TABLET BY MOUTH TWICE A DAY 60 tablet 11  . potassium chloride SA (K-DUR,KLOR-CON) 20 MEQ tablet Take 40 mEq by mouth 2 (two) times daily.     . QUEtiapine (SEROQUEL) 200 MG tablet TAKE 1 TABLET BY MOUTH AT BEDTIME 30 tablet 3  . rOPINIRole (REQUIP) 4 MG tablet TAKE 1 TABLET BY MOUTH AT BEDTIME 30 tablet 6  . torsemide (DEMADEX) 20 MG tablet Take 40 mg by mouth daily.    [name] ADVAIR DISKUS 250-50  MCG/DOSE AEPB INHALE 1 PUFF INTO LUNGS TWICE A DAY 60 each 11  . albuterol (PROAIR HFA) 108 (90 BASE) MCG/ACT inhaler Inhale 2 puffs into the lungs every 6 (six) hours as needed for wheezing or shortness of breath. Reported on 08/18/2015    . ALPRAZolam (XANAX) 1 MG tablet TAKE 1/2 TO 1 TABLET TWICE A DAY AS NEEDED FOR ANXIETY AND SLEEP 60 tablet 0  . B Complex-C ([employer] B COMPLEX PLUS C) TABS TAKE ONE CAPSULE BY MOUTH EVERY DAY 30 tablet 6  . buPROPion (WELLBUTRIN XL) 150 MG 24 hr tablet Take 150 mg by mouth daily.    [name] desvenlafaxine (PRISTIQ) 50 MG 24 hr tablet Take 3 tablets (150 mg total) by mouth daily. 90 tablet 11  . diclofenac sodium (VOLTAREN) 1 % GEL Apply topically 3 (three) times daily as needed (pain).    [name] dicyclomine (BENTYL) 20 MG tablet Take 1 tablet (20 mg total) by mouth 2 (two) times daily. 20 tablet 0  . glucose blood (ONE TOUCH ULTRA TEST) test strip Check blood sugar 4 times daily 200 each 3  . guaiFENesin (MUCINEX) 600 MG 12 hr tablet Take 600 mg by mouth 2 (two) times daily.     [name] guaiFENesin-codeine 100-10 MG/5ML syrup Take 5 mLs by mouth 2 (two) times daily as needed for cough.   0  . Insulin Glargine (LANTUS SOLOSTAR) 100 UNIT/ML Solostar Pen Inject 60 Units into the skin every morning.    . insulin lispro (HUMALOG KWIKPEN) 100 UNIT/ML KiwkPen Inject 5-9 Units into the skin 2 (two) times daily as needed (CBG >300). Inject 5 units subcutaneously with lunch and supper for CBG >300, if CBG >350 at supper inject 8-9 units    . Insulin Pen Needle (B-D ULTRAFINE III SHORT PEN) 31G X 8 MM MISC Use as directed to inject insulin twice daily. Dx: E11.8 200 each 2  . ipratropium-albuterol (DUONEB) 0.5-2.5 (3) MG/3ML SOLN Take 3 mLs by nebulization 2 (two) times daily. 180 mL 5  . isosorbide mononitrate (IMDUR) 30 MG 24 hr tablet Take 1 tablet (30 mg total) by mouth daily. 30 tablet 6  . Lactulose 20 GM/30ML SOLN Take 30 mLs (20 g total) by mouth 2 (two) times daily. But skip one dose  if diarrhea 30 mL   . levalbuterol (XOPENEX) 0.63 MG/3ML nebulizer solution Take 0.63 mg by nebulization every 8 (eight) hours as needed for wheezing or shortness of breath.   5  . loratadine (CLARITIN) 10 MG tablet TAKE 1 TABLET BY MOUTH EVERY DAY 30 tablet 11  . metolazone (ZAROXOLYN) 2.5 MG tablet daily. TAKE 1 TABLET BY MOUTH  FOR 4 LB WEIGHT GAIN IN 24 HOURS    . mupirocin cream (BACTROBAN) 2 % Apply 1 application topically 2 (two) times daily as needed (wound care). Reported on 11/14/2015    . nitroGLYCERIN (NITROSTAT) 0.4 MG SL tablet Place 1 tablet (0.4 mg total) under the tongue every 5 (five) minutes as needed for chest pain. No more than 3 in 15 minutes 25 tablet 1  . nystatin (MYCOSTATIN) 100000 UNIT/ML suspension Take 5 mLs (500,000 Units total) by mouth 4 (four) times daily. X 7DAYS 60 mL 3  . ONETOUCH DELICA LANCETS 96Q MISC USE TO CHECK SUGAR 4 TIMES DAILY AND AS NEEDED. IW:L79.89 **ONE TOUCH DELICA** 211 each 1  . oxyCODONE (OXY IR/ROXICODONE) 5 MG immediate release tablet Take 1 tablet (5 mg total) by mouth every 6 (six) hours as needed for moderate pain or severe pain. 60 tablet 0  . OXYGEN Inhale 3-4 L into the lungs continuous.    Vladimir Faster Glycol-Propyl Glycol (SYSTANE OP) Place 1 drop into both eyes daily.    . polyethylene glycol (MIRALAX) packet Take 17 g by mouth daily. 2 each 0  . promethazine (PHENERGAN) 25 MG tablet Take 25 mg by mouth 2 (two) times daily as needed for nausea or vomiting.    . simvastatin (ZOCOR) 40 MG tablet Take 40 mg by mouth at bedtime.     . sodium chloride (OCEAN) 0.65 % SOLN nasal spray Place 1 spray into both nostrils 2 (two) times daily as needed for congestion.    . traMADol (ULTRAM) 50 MG tablet Take by mouth every 6 (six) hours as needed.    . Vitamin D, Ergocalciferol, (DRISDOL) 50000 units CAPS capsule Take 50,000 Units by mouth every Friday.   6   No current facility-administered medications for this visit.    Allergies  Allergen  Reactions  . Acetaminophen Other (See Comments)    Liver dysfunction  . Nsaids Other (See Comments)    Liver dysfunction   . Aspirin Other (See Comments)    Causes nosebleeds  . Theophylline Nausea And Vomiting  . Penicillin G Itching  . Penicillins Itching    Has patient had a PCN reaction causing immediate rash, facial/tongue/throat swelling, SOB or lightheadedness with hypotension: unknown Has patient had a PCN reaction causing severe rash involving mucus membranes or skin necrosis: unknown Has patient had a PCN reaction that required hospitalization unknown Has patient had a PCN reaction occurring within the last 10 years: yes If all of the above answers are "NO", then may proceed with Cephalosporin use.      Review of Systems: All systems reviewed and negative except where noted in HPI.   Lab Results  Component Value Date   WBC 6.9 04/07/2016   HGB 9.9 (L) 04/07/2016   HCT 29.9 (L) 04/07/2016   MCV 96.5 04/07/2016   PLT 102 (L) 04/07/2016    Lab Results  Component Value Date   CREATININE 1.30 (H) 10/01/2016   BUN 21 (H) 04/07/2016   NA 133 (L) 04/07/2016   K 3.6 04/07/2016   CL 99 (L) 04/07/2016   CO2 25 04/07/2016  Lab Results  Component Value Date   ALT 24 04/07/2016   AST 27 04/07/2016   ALKPHOS 136 (H) 04/07/2016   BILITOT 2.2 (H) 04/07/2016    Lab Results  Component Value Date   INR 1.21 03/01/2016   INR 1.29 11/01/2015   INR 1.1 (H) 04/19/2015     Physical Exam: BP 140/60 (BP Location: Right Wrist, Patient Position: Sitting, Cuff Size: Normal)   Pulse 68   Wt 175 lb 6 oz (79.5 kg)   BMI 31.57 kg/m  Constitutional: Pleasant,female in no acute distress, wearing oxygen, in wheelchair HEENT: Normocephalic and atraumatic. Conjunctivae are normal. No scleral icterus. Neck supple.  Cardiovascular: Normal rate, regular rhythm. 2/6 SEM Pulmonary/chest: Effort normal and breath sounds normal. No wheezing, rales or rhonchi. Abdominal: Soft,  protuberant, nontender.There are no masses palpable. Extremities: no edema Lymphadenopathy: No cervical adenopathy noted. Neurological: Alert and oriented to person place and time. Skin: Skin is warm and dry. No rashes noted. Psychiatric: Normal mood and affect. Behavior is normal.   ASSESSMENT AND PLAN: 76 year old female with multiple medical problems here for reassessment of the issues as outlined below:  NASH cirrhosis / GAVE / anemia - she is status post radiofrequency ablation of GAVE about a year ago at [facility], her anemia was clearly improved with this and significantly decreased her transfusion requirement. I don't have recent labs  and will get recent CBC and iron studies from her primary doctor. If she is iron deficient she should receive IV iron supplementation. If she needs a blood transfusion moving forward for worsening anemia I would recommend she go back to [facility] for additional radiofrequency ablative therapy. Regarding her underlying cirrhosis her MRCP shows no concerning liver lesions. I will send an AFP level to screen for [facility]. Her prior EGDs have not shown esophageal varices however varices are noted on her MRI. Given her other medical problems she is at higher than average risk for anesthesia for another endoscopy and she and her daughter wish to avoid this if possible. She has baseline heart rate around 60, I'm concerned she may not tolerate propranolol very well and she and daughter wish to hold off for now. I recommend she follow up with her primary care and cardiologist to see if propanolol use would be possible to prevent hemorrhage from varices. She agreed.   Abnormal imaging of the pancreas / suspected IPMN - suspected IPMNs on EUS and MRCP, MRCP findings appear stable over the past few years without concerning lesions. She does understand this is a risk factor for pancreatic cancer, however she is an extremely poor surgical candidate. We may consider another  MRCP in 1-2 years to assess this lesion. She agreed  Orleans Cellar, MD [facility] Pager [phone]  CC: Garwin Brothers, MD

## 2017-04-07 ENCOUNTER — Telehealth: Payer: Self-pay

## 2017-04-07 NOTE — Telephone Encounter (Signed)
Faxed request to have [facility] send over resent labs.

## 2017-04-07 NOTE — Telephone Encounter (Signed)
Received call from [facility], Monico Blitz, NP she is wanting to know if Dr. Havery Moros plans to follow patient for future blood transfusions. I told her I would have to ask him when he returned to office on Tuesday. Looking at patient's office note from 11/04/16, he does address this issue and states that he would send the patient back to [facility] where she was successfully  treated with radiofrequency ablation therapy. I called Ms. Linton Rump back [phone]) to let her know what I had found in our last note. She asked that I fax this over to them for continuity of care, fax# [phone]. Note faxed.

## 2017-04-07 NOTE — Telephone Encounter (Signed)
Thanks [name]. I don't see any recent labs available regarding her anemia. She has GAVE, previously treated at [facility] with radiofrequency ablation which helped treat it and minimize transfusion requirements. I would like to see her most recent CBC and iron levels. If she has needed repeated transfusions, her primary care can follow CBC and transfuse as needed, but if thought to be due to GI blood loss / GAVE, she will need to see [facility] again for repeat radiofrequency ablation.

## 2017-04-08 ENCOUNTER — Ambulatory Visit (HOSPITAL_COMMUNITY)
Admission: RE | Admit: 2017-04-08 | Discharge: 2017-04-08 | Disposition: A | Discharge status: Home / Self Care | Payer: Medicare Other | Source: Ambulatory Visit | Attending: Adult Health | Admitting: Adult Health

## 2017-04-08 ENCOUNTER — Other Ambulatory Visit (HOSPITAL_COMMUNITY): Payer: Self-pay | Admitting: *Deleted

## 2017-04-08 DIAGNOSIS — D500 Iron deficiency anemia secondary to blood loss (chronic): Secondary | ICD-10-CM | POA: Diagnosis present

## 2017-04-08 LAB — PREPARE RBC (CROSSMATCH)

## 2017-04-09 ENCOUNTER — Telehealth: Payer: Self-pay | Admitting: Gastroenterology

## 2017-04-09 ENCOUNTER — Encounter (HOSPITAL_COMMUNITY)
Admission: RE | Admit: 2017-04-09 | Discharge: 2017-04-09 | Disposition: A | Discharge status: Home / Self Care | Payer: Medicare Other | Source: Ambulatory Visit | Attending: Adult Health | Admitting: Adult Health

## 2017-04-09 DIAGNOSIS — D500 Iron deficiency anemia secondary to blood loss (chronic): Secondary | ICD-10-CM | POA: Insufficient documentation

## 2017-04-09 MED ORDER — SODIUM CHLORIDE 0.9 % IV SOLN: Freq: Once | INTRAVENOUS | Status: DC

## 2017-04-09 NOTE — Telephone Encounter (Signed)
Labs recently returned from primary office:  04/07/2017: Hgb 7.4, MCV 97, WBC 2.9, plt 72 Iron 40, TIBC 323, % sat 12, ferritin 24  Could be multifactorial anemia - iron levels mildly low but MCV normal.  She is being transfused by her primary care with RBC today I imaging they will check a repeat CBC in the upcoming days.  They need to check other labs for workup of anemia. Pending the cause, if thought to be from [place] we can consider repeat radiofrequency ablation, but she needs the additional workup first that her primary care can order and let us know. Thanks

## 2017-04-10 ENCOUNTER — Ambulatory Visit (INDEPENDENT_AMBULATORY_CARE_PROVIDER_SITE_OTHER): Payer: Medicare Other

## 2017-04-10 ENCOUNTER — Ambulatory Visit (INDEPENDENT_AMBULATORY_CARE_PROVIDER_SITE_OTHER): Payer: Medicare Other | Admitting: Family

## 2017-04-10 DIAGNOSIS — S92352A Displaced fracture of fifth metatarsal bone, left foot, initial encounter for closed fracture: Secondary | ICD-10-CM | POA: Diagnosis not present

## 2017-04-10 DIAGNOSIS — M79672 Pain in left foot: Secondary | ICD-10-CM

## 2017-04-10 LAB — TYPE AND SCREEN
ABO/RH(D): O POS
Antibody Screen: POSITIVE
DAT, IGG: NEGATIVE
Donor AG Type: NEGATIVE
Donor AG Type: NEGATIVE
UNIT DIVISION: 0
Unit division: 0

## 2017-04-10 LAB — BPAM RBC
BLOOD PRODUCT EXPIRATION DATE: 201808302359
BLOOD PRODUCT EXPIRATION DATE: 201809062359
ISSUE DATE / TIME: 201808150832
ISSUE DATE / TIME: 201808151122
Unit Type and Rh: 5100
Unit Type and Rh: 5100
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
## 2017-04-15 ENCOUNTER — Other Ambulatory Visit: Payer: Self-pay

## 2017-04-15 NOTE — Telephone Encounter (Signed)
Left message for patient's daughter, [name], to call back.

## 2017-04-15 NOTE — Telephone Encounter (Signed)
Almyra Free can you provide an update for this patient - not sure if they have plans for a repeat CBC and other workup they have done for anemia (iron studies, etc), which I think she needs done. Thanks

## 2017-04-15 NOTE — Telephone Encounter (Signed)
Okay thanks. I would like to see her most recent labs. If Hgb is stable hopefully she can just get by with iron supplementation. If worsening anemia leading to frequent transfusion then she will need to have another endoscopy at [facility] for radiofrequency ablation of GAVE. thanks

## 2017-04-15 NOTE — Telephone Encounter (Signed)
Spoke to [place], patient's daughter, she said that she is getting lab work weekly. She is not sure what the plan is, said her mom was doing better. Patient usually sees Monico Blitz, NP. Her phone number is: [phone], if you want to talk directly to Ms. Linton Rump. I will call over to their office [phone]) and request recent notes/labs.

## 2017-04-16 NOTE — Telephone Encounter (Signed)
Campbell Soup, spoke to patient's nurse, requested that they fax over results from today's blood draw.

## 2017-04-24 ENCOUNTER — Ambulatory Visit (INDEPENDENT_AMBULATORY_CARE_PROVIDER_SITE_OTHER): Payer: Medicare Other | Admitting: Orthopedic Surgery

## 2017-05-08 ENCOUNTER — Ambulatory Visit (INDEPENDENT_AMBULATORY_CARE_PROVIDER_SITE_OTHER): Payer: Medicare Other

## 2017-05-08 ENCOUNTER — Ambulatory Visit (INDEPENDENT_AMBULATORY_CARE_PROVIDER_SITE_OTHER): Payer: Medicare Other | Admitting: Orthopedic Surgery

## 2017-05-08 DIAGNOSIS — S92352D Displaced fracture of fifth metatarsal bone, left foot, subsequent encounter for fracture with routine healing: Secondary | ICD-10-CM

## 2017-05-08 DIAGNOSIS — M79672 Pain in left foot: Secondary | ICD-10-CM
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
## 2017-05-15 ENCOUNTER — Encounter (HOSPITAL_COMMUNITY): Payer: Self-pay | Admitting: *Deleted

## 2017-05-15 ENCOUNTER — Observation Stay (HOSPITAL_COMMUNITY)
Admission: EM | Admit: 2017-05-15 | Discharge: 2017-05-17 | Disposition: A | Discharge status: Home / Self Care | Payer: Medicare Other | Attending: Emergency Medicine | Admitting: Emergency Medicine

## 2017-05-15 ENCOUNTER — Telehealth: Payer: Self-pay | Admitting: Gastroenterology

## 2017-05-15 DIAGNOSIS — E1122 Type 2 diabetes mellitus with diabetic chronic kidney disease: Secondary | ICD-10-CM | POA: Diagnosis not present

## 2017-05-15 DIAGNOSIS — D500 Iron deficiency anemia secondary to blood loss (chronic): Secondary | ICD-10-CM | POA: Diagnosis not present

## 2017-05-15 DIAGNOSIS — I10 Essential (primary) hypertension: Secondary | ICD-10-CM | POA: Diagnosis not present

## 2017-05-15 DIAGNOSIS — K7682 Hepatic encephalopathy: Secondary | ICD-10-CM

## 2017-05-15 DIAGNOSIS — K5520 Angiodysplasia of colon without hemorrhage: Secondary | ICD-10-CM | POA: Diagnosis not present

## 2017-05-15 DIAGNOSIS — K922 Gastrointestinal hemorrhage, unspecified: Secondary | ICD-10-CM | POA: Diagnosis not present

## 2017-05-15 DIAGNOSIS — Z79899 Other long term (current) drug therapy: Secondary | ICD-10-CM | POA: Insufficient documentation

## 2017-05-15 DIAGNOSIS — I5032 Chronic diastolic (congestive) heart failure: Secondary | ICD-10-CM | POA: Insufficient documentation

## 2017-05-15 DIAGNOSIS — Z66 Do not resuscitate: Secondary | ICD-10-CM | POA: Diagnosis not present

## 2017-05-15 DIAGNOSIS — D649 Anemia, unspecified: Secondary | ICD-10-CM | POA: Diagnosis present

## 2017-05-15 DIAGNOSIS — E119 Type 2 diabetes mellitus without complications: Secondary | ICD-10-CM | POA: Insufficient documentation

## 2017-05-15 DIAGNOSIS — N183 Chronic kidney disease, stage 3 unspecified: Secondary | ICD-10-CM | POA: Diagnosis present

## 2017-05-15 DIAGNOSIS — M797 Fibromyalgia: Secondary | ICD-10-CM | POA: Diagnosis present

## 2017-05-15 DIAGNOSIS — J449 Chronic obstructive pulmonary disease, unspecified: Secondary | ICD-10-CM | POA: Diagnosis not present

## 2017-05-15 DIAGNOSIS — Z87891 Personal history of nicotine dependence: Secondary | ICD-10-CM | POA: Insufficient documentation

## 2017-05-15 DIAGNOSIS — E7800 Pure hypercholesterolemia, unspecified: Secondary | ICD-10-CM | POA: Diagnosis present

## 2017-05-15 DIAGNOSIS — K7581 Nonalcoholic steatohepatitis (NASH): Secondary | ICD-10-CM

## 2017-05-15 DIAGNOSIS — Z794 Long term (current) use of insulin: Secondary | ICD-10-CM | POA: Insufficient documentation

## 2017-05-15 DIAGNOSIS — J45909 Unspecified asthma, uncomplicated: Secondary | ICD-10-CM | POA: Insufficient documentation

## 2017-05-15 DIAGNOSIS — I110 Hypertensive heart disease with heart failure: Secondary | ICD-10-CM | POA: Diagnosis not present

## 2017-05-15 DIAGNOSIS — K7200 Acute and subacute hepatic failure without coma: Secondary | ICD-10-CM

## 2017-05-15 DIAGNOSIS — IMO0001 Reserved for inherently not codable concepts without codable children: Secondary | ICD-10-CM | POA: Diagnosis present

## 2017-05-15 DIAGNOSIS — G9340 Encephalopathy, unspecified: Secondary | ICD-10-CM | POA: Diagnosis not present

## 2017-05-15 DIAGNOSIS — I359 Nonrheumatic aortic valve disorder, unspecified: Secondary | ICD-10-CM | POA: Diagnosis not present

## 2017-05-15 DIAGNOSIS — D61818 Other pancytopenia: Secondary | ICD-10-CM | POA: Diagnosis present

## 2017-05-15 DIAGNOSIS — K31819 Angiodysplasia of stomach and duodenum without bleeding: Secondary | ICD-10-CM | POA: Diagnosis present

## 2017-05-15 DIAGNOSIS — K7460 Unspecified cirrhosis of liver: Secondary | ICD-10-CM | POA: Insufficient documentation

## 2017-05-15 DIAGNOSIS — J9611 Chronic respiratory failure with hypoxia: Secondary | ICD-10-CM | POA: Diagnosis not present

## 2017-05-15 DIAGNOSIS — K754 Autoimmune hepatitis: Secondary | ICD-10-CM | POA: Diagnosis not present

## 2017-05-15 LAB — COMPREHENSIVE METABOLIC PANEL
ALBUMIN: 2.7 g/dL — AB (ref 3.5–5.0)
ALT: 32 U/L (ref 14–54)
ANION GAP: 3 — AB (ref 5–15)
AST: 43 U/L — ABNORMAL HIGH (ref 15–41)
Alkaline Phosphatase: 111 U/L (ref 38–126)
BILIRUBIN TOTAL: 1.2 mg/dL (ref 0.3–1.2)
BUN: 27 mg/dL — ABNORMAL HIGH (ref 6–20)
CO2: 27 mmol/L (ref 22–32)
Calcium: 8.4 mg/dL — ABNORMAL LOW (ref 8.9–10.3)
Chloride: 111 mmol/L (ref 101–111)
Creatinine, Ser: 1.39 mg/dL — ABNORMAL HIGH (ref 0.44–1.00)
GFR calc Af Amer: 42 mL/min — ABNORMAL LOW (ref >60)
GFR calc non Af Amer: 37 mL/min — ABNORMAL LOW (ref >60)
GLUCOSE: 145 mg/dL — AB (ref 65–99)
POTASSIUM: 4 mmol/L (ref 3.5–5.1)
SODIUM: 141 mmol/L (ref 135–145)
TOTAL PROTEIN: 4.9 g/dL — AB (ref 6.5–8.1)

## 2017-05-15 LAB — URINALYSIS, ROUTINE W REFLEX MICROSCOPIC
BILIRUBIN URINE: NEGATIVE
GLUCOSE, UA: NEGATIVE mg/dL
HGB URINE DIPSTICK: NEGATIVE
KETONES UR: NEGATIVE mg/dL
Leukocytes, UA: NEGATIVE
Nitrite: NEGATIVE
PROTEIN: NEGATIVE mg/dL
Specific Gravity, Urine: 1.008 (ref 1.005–1.030)
pH: 5 (ref 5.0–8.0)

## 2017-05-15 LAB — GLUCOSE, CAPILLARY
GLUCOSE-CAPILLARY: 67 mg/dL (ref 65–99)
Glucose-Capillary: 89 mg/dL (ref 65–99)

## 2017-05-15 LAB — AMMONIA: AMMONIA: 53 umol/L — AB (ref 9–35)

## 2017-05-15 LAB — CBC WITH DIFFERENTIAL/PLATELET
BASOS ABS: 0 10*3/uL (ref 0.0–0.1)
Basophils Relative: 1 %
Eosinophils Absolute: 0.1 10*3/uL (ref 0.0–0.7)
Eosinophils Relative: 4 %
HEMATOCRIT: 18.5 % — AB (ref 36.0–46.0)
Hemoglobin: 5.7 g/dL — CL (ref 12.0–15.0)
LYMPHS PCT: 12 %
Lymphs Abs: 0.2 10*3/uL — ABNORMAL LOW (ref 0.7–4.0)
MCH: 31.7 pg (ref 26.0–34.0)
MCHC: 30.8 g/dL (ref 30.0–36.0)
MCV: 102.8 fL — AB (ref 78.0–100.0)
MONO ABS: 0.1 10*3/uL (ref 0.1–1.0)
Monocytes Relative: 8 %
NEUTROS ABS: 1.4 10*3/uL — AB (ref 1.7–7.7)
Neutrophils Relative %: 76 %
Platelets: 55 10*3/uL — ABNORMAL LOW (ref 150–400)
RBC: 1.8 MIL/uL — ABNORMAL LOW (ref 3.87–5.11)
RDW: 20 % — AB (ref 11.5–15.5)
WBC: 1.9 10*3/uL — ABNORMAL LOW (ref 4.0–10.5)

## 2017-05-15 LAB — PREPARE RBC (CROSSMATCH)

## 2017-05-15 LAB — PROTIME-INR
INR: 1.15
Prothrombin Time: 14.6 seconds (ref 11.4–15.2)

## 2017-05-15 LAB — POC OCCULT BLOOD, ED: FECAL OCCULT BLD: POSITIVE — AB

## 2017-05-15 MED ORDER — POLYETHYLENE GLYCOL 3350 17 G PO PACK
17.0000 g | PACK | Freq: Every day | ORAL | Status: DC
  Administered 2017-05-16 – 2017-05-17 (×2): 17 g via ORAL
  Filled 2017-05-15 (×2): qty 1

## 2017-05-15 MED ORDER — MOMETASONE FURO-FORMOTEROL FUM 200-5 MCG/ACT IN AERO
2.0000 | INHALATION_SPRAY | Freq: Two times a day (BID) | RESPIRATORY_TRACT | Status: DC
  Administered 2017-05-15 – 2017-05-17 (×4): 2 via RESPIRATORY_TRACT
  Filled 2017-05-15: qty 8.8

## 2017-05-15 MED ORDER — QUETIAPINE FUMARATE 200 MG PO TABS
200.0000 mg | ORAL_TABLET | Freq: Every day | ORAL | Status: DC
  Administered 2017-05-15: 200 mg via ORAL
  Filled 2017-05-15: qty 1

## 2017-05-15 MED ORDER — FERROUS GLUCONATE 324 (38 FE) MG PO TABS
324.0000 mg | ORAL_TABLET | Freq: Three times a day (TID) | ORAL | Status: DC
  Administered 2017-05-16 – 2017-05-17 (×5): 324 mg via ORAL
  Filled 2017-05-15 (×6): qty 1

## 2017-05-15 MED ORDER — POLYVINYL ALCOHOL 1.4 % OP SOLN
1.0000 [drp] | OPHTHALMIC | Status: DC | PRN
  Administered 2017-05-16: 1 [drp] via OPHTHALMIC
  Filled 2017-05-15: qty 15

## 2017-05-15 MED ORDER — SODIUM CHLORIDE 0.9 % IV SOLN
INTRAVENOUS | Status: DC
  Administered 2017-05-15: 21:00:00 via INTRAVENOUS

## 2017-05-15 MED ORDER — BUPROPION HCL ER (XL) 150 MG PO TB24
150.0000 mg | ORAL_TABLET | Freq: Every day | ORAL | Status: DC
  Administered 2017-05-16 – 2017-05-17 (×2): 150 mg via ORAL
  Filled 2017-05-15 (×2): qty 1

## 2017-05-15 MED ORDER — PROPYLENE GLYCOL 0.6 % OP SOLN: 1.0000 [drp] | Freq: Every morning | OPHTHALMIC | Status: DC

## 2017-05-15 MED ORDER — ISOSORBIDE MONONITRATE ER 30 MG PO TB24
15.0000 mg | ORAL_TABLET | Freq: Every day | ORAL | Status: DC
  Administered 2017-05-16: 10:00:00 via ORAL
  Administered 2017-05-17: 15 mg via ORAL
  Filled 2017-05-15 (×2): qty 1

## 2017-05-15 MED ORDER — LEVALBUTEROL HCL 0.63 MG/3ML IN NEBU
0.6300 mg | INHALATION_SOLUTION | Freq: Two times a day (BID) | RESPIRATORY_TRACT | Status: DC
  Administered 2017-05-15 – 2017-05-17 (×4): 0.63 mg via RESPIRATORY_TRACT
  Filled 2017-05-15 (×4): qty 3

## 2017-05-15 MED ORDER — ALPRAZOLAM 0.5 MG PO TABS
0.5000 mg | ORAL_TABLET | Freq: Two times a day (BID) | ORAL | Status: DC | PRN
  Administered 2017-05-15: 0.5 mg via ORAL
  Filled 2017-05-15: qty 1

## 2017-05-15 MED ORDER — GUAIFENESIN ER 600 MG PO TB12
600.0000 mg | ORAL_TABLET | Freq: Two times a day (BID) | ORAL | Status: DC
  Administered 2017-05-15 – 2017-05-17 (×4): 600 mg via ORAL
  Filled 2017-05-15 (×4): qty 1

## 2017-05-15 MED ORDER — SODIUM CHLORIDE 0.9 % IV SOLN: 10.0000 mL/h | Freq: Once | INTRAVENOUS | Status: DC

## 2017-05-15 MED ORDER — RIFAXIMIN 550 MG PO TABS
550.0000 mg | ORAL_TABLET | Freq: Two times a day (BID) | ORAL | Status: DC
  Administered 2017-05-15 – 2017-05-17 (×4): 550 mg via ORAL
  Filled 2017-05-15 (×4): qty 1

## 2017-05-15 MED ORDER — POTASSIUM CHLORIDE CRYS ER 20 MEQ PO TBCR
40.0000 meq | EXTENDED_RELEASE_TABLET | Freq: Every day | ORAL | Status: DC
  Administered 2017-05-16 – 2017-05-17 (×2): 40 meq via ORAL
  Filled 2017-05-15 (×2): qty 2

## 2017-05-15 MED ORDER — TORSEMIDE 20 MG PO TABS
40.0000 mg | ORAL_TABLET | Freq: Every day | ORAL | Status: DC
  Administered 2017-05-16 – 2017-05-17 (×2): 40 mg via ORAL
  Filled 2017-05-15 (×2): qty 2

## 2017-05-15 MED ORDER — ATORVASTATIN CALCIUM 20 MG PO TABS
20.0000 mg | ORAL_TABLET | Freq: Every day | ORAL | Status: DC
  Administered 2017-05-16: 20 mg via ORAL
  Filled 2017-05-15: qty 1

## 2017-05-15 MED ORDER — SACCHAROMYCES BOULARDII 250 MG PO CAPS
250.0000 mg | ORAL_CAPSULE | Freq: Two times a day (BID) | ORAL | Status: DC
  Administered 2017-05-15 – 2017-05-17 (×4): 250 mg via ORAL
  Filled 2017-05-15 (×4): qty 1

## 2017-05-15 MED ORDER — TRAMADOL HCL 50 MG PO TABS
50.0000 mg | ORAL_TABLET | Freq: Two times a day (BID) | ORAL | Status: DC
  Administered 2017-05-15 – 2017-05-17 (×4): 50 mg via ORAL
  Filled 2017-05-15 (×4): qty 1

## 2017-05-15 MED ORDER — ROPINIROLE HCL 1 MG PO TABS
4.0000 mg | ORAL_TABLET | Freq: Every day | ORAL | Status: DC
  Administered 2017-05-15 – 2017-05-16 (×2): 4 mg via ORAL
  Filled 2017-05-15 (×2): qty 4

## 2017-05-15 MED ORDER — OXYCODONE HCL 5 MG PO TABS
5.0000 mg | ORAL_TABLET | Freq: Four times a day (QID) | ORAL | Status: DC | PRN
  Administered 2017-05-16: 5 mg via ORAL
  Filled 2017-05-15: qty 1

## 2017-05-15 MED ORDER — VENLAFAXINE HCL ER 75 MG PO CP24
75.0000 mg | ORAL_CAPSULE | Freq: Every day | ORAL | Status: DC
  Administered 2017-05-16 – 2017-05-17 (×2): 75 mg via ORAL
  Filled 2017-05-15 (×2): qty 1

## 2017-05-15 MED ORDER — INSULIN GLARGINE 100 UNIT/ML ~~LOC~~ SOLN
45.0000 [IU] | Freq: Every morning | SUBCUTANEOUS | Status: DC
  Administered 2017-05-16 – 2017-05-17 (×2): 45 [IU] via SUBCUTANEOUS
  Filled 2017-05-15 (×2): qty 0.45

## 2017-05-15 MED ORDER — SODIUM CHLORIDE 0.9 % IV BOLUS (SEPSIS)
500.0000 mL | Freq: Once | INTRAVENOUS | Status: AC
  Administered 2017-05-15: 500 mL via INTRAVENOUS

## 2017-05-15 MED ORDER — LACTULOSE 10 GM/15ML PO SOLN
20.0000 g | Freq: Three times a day (TID) | ORAL | Status: DC
  Administered 2017-05-15: 20 g via ORAL
  Filled 2017-05-15: qty 30

## 2017-05-15 MED ORDER — INSULIN ASPART 100 UNIT/ML ~~LOC~~ SOLN
0.0000 [IU] | Freq: Three times a day (TID) | SUBCUTANEOUS | Status: DC
  Administered 2017-05-16: 1 [IU] via SUBCUTANEOUS
  Administered 2017-05-16: 2 [IU] via SUBCUTANEOUS
  Administered 2017-05-17: 1 [IU] via SUBCUTANEOUS

## 2017-05-15 MED ORDER — LORATADINE 10 MG PO TABS
10.0000 mg | ORAL_TABLET | Freq: Every day | ORAL | Status: DC
  Administered 2017-05-16 – 2017-05-17 (×2): 10 mg via ORAL
  Filled 2017-05-15 (×2): qty 1

## 2017-05-15 MED ORDER — DICYCLOMINE HCL 20 MG PO TABS
20.0000 mg | ORAL_TABLET | Freq: Two times a day (BID) | ORAL | Status: DC
  Administered 2017-05-15 – 2017-05-17 (×4): 20 mg via ORAL
  Filled 2017-05-15 (×4): qty 1

## 2017-05-15 MED ORDER — PANTOPRAZOLE SODIUM 40 MG PO TBEC
40.0000 mg | DELAYED_RELEASE_TABLET | Freq: Two times a day (BID) | ORAL | Status: DC
  Administered 2017-05-15 – 2017-05-17 (×4): 40 mg via ORAL
  Filled 2017-05-15 (×4): qty 1

## 2017-05-15 NOTE — ED Provider Notes (Signed)
[place] DEPT Provider Note   CSN: [id number] Arrival date & time: 05/15/17  1527     History   Chief Complaint Chief Complaint  Patient presents with  . Low Hemoglobin    HPI [name] is a 76 y.o. female.  HPI Patient has a history of gastric antral vascular ectasia.  This has caused chronic/recurrent gastrointestinal bleeding resulting in recurrent anemia and blood transfusions.patient states she has had intermittent trouble with blood in her stools in the last several months but has not noticed any acute blood loss recently. She is a resident of [facility]. They checked her hemoglobin last evening and her hemoglobin was down to 6. Plan was for her to have an outpatient transfusion. According to EMS, the patient was told her hemoglobin was too low for an outpatient transfusion. She was instructed to come to the hospital. Past Medical History:  Diagnosis Date  . Adenomatous colon polyp   . Anxiety   . Asthma   . Back pain   . Benign neoplasm of colon   . CAP (community acquired pneumonia) 2015  . Carpal tunnel syndrome on both sides   . Chronic airway obstruction, not elsewhere classified   . Chronic diastolic CHF (congestive heart failure) ([place])    a. 03/26/2014 Echo: EF 55-60%, no rwma, mild AS/AI, mod-sev Ca2+ MV annulus, mildly to mod dil LA.  [name] Coarse tremors    a. arms.  . Diverticulosis of colon (without mention of hemorrhage)   . Esophageal reflux   . Falls frequently    completed HHPT/OT 06/2014, PT unmet goals  . GAVE (gastric antral vascular ectasia)    angiodysplasia; s/p multiple APCs, discussing RFA with [facility] GI Dr Newman Pies (06/2015)  . H/O hiatal hernia   . Hepatitis   . Hiatal hernia   . Iron deficiency anemia secondary to blood loss (chronic)    a. frequent PRBC transfusions.  . Liver cirrhosis secondary to nonalcoholic steatohepatitis (NASH) ([place])    a. dx'd 1990's  . Major depressive disorder, recurrent episode, severe,  specified as with psychotic behavior   . Memory loss   . Midsternal chest pain    a. conservatively managed ->poor candidate for cath/anticoagulation.  . Mild aortic stenosis    a. 03/2014 Valve area (VTI): 2.89 cm^2, Valve area (Vmax): 2.7 cm^2.  . Morbid obesity ([place])   . Neck pain   . Non-obstructive CAD   . On home oxygen therapy    "3L; 24/7" (11/29/2015)  . OSA (obstructive sleep apnea)    a. does not use CPAP. (01/25/2015)  . Osteoarthrosis, unspecified whether generalized or localized, unspecified site   . Osteoporosis, unspecified   . Palpitations   . Portal hypertensive gastropathy   . Protein calorie malnutrition ([place])   . Pure hypercholesterolemia   . Recurrent UTI (urinary tract infection)    h/o hospitalization with urosepsis 2015, but thought large component colonization/bacteriuria, only treat if symptomatic ([place])  . Restless legs syndrome (RLS)   . Thrombocytopenia ([place])   . Type II diabetes mellitus ([place])   . Unspecified chronic bronchitis   . Unspecified essential hypertension       Past Surgical History:  Procedure Laterality Date  . APPENDECTOMY    . APPENDECTOMY    . BALLOON DILATION  06/12/2012   Procedure: BALLOON DILATION;  Surgeon: Lafayette Dragon, MD;  Location: [facility];  Service: Endoscopy;  Laterality: N/A;  ?balloon  . BREAST BIOPSY     bilateral  .  CESAREAN SECTION     x 3  . ENTEROSCOPY N/A 02/25/2015   Procedure: ENTEROSCOPY;  Surgeon: [name] Ada, MD;  Location: [facility] ENDOSCOPY;  Service: Endoscopy;  Laterality: N/A;  . ENTEROSCOPY N/A 06/27/2015   Procedure: ENTEROSCOPY;  Surgeon: Manus Gunning, MD;  Location: [facility];  Service: Gastroenterology;  Laterality: N/A;  . ESOPHAGOGASTRODUODENOSCOPY  06/12/2012   Procedure: ESOPHAGOGASTRODUODENOSCOPY (EGD);  Surgeon: Lafayette Dragon, MD;  Location: [facility];  Service: Endoscopy;  Laterality: N/A;  . ESOPHAGOGASTRODUODENOSCOPY  09/10/2012   Procedure: ESOPHAGOGASTRODUODENOSCOPY  (EGD);  Surgeon: Lafayette Dragon, MD;  Location: [facility];  Service: Endoscopy;  Laterality: N/A;  . ESOPHAGOGASTRODUODENOSCOPY N/A 01/18/2013   Procedure: ESOPHAGOGASTRODUODENOSCOPY (EGD);  Surgeon: Lafayette Dragon, MD;  Location: [facility]COPY;  Service: Endoscopy;  Laterality: N/A;  . ESOPHAGOGASTRODUODENOSCOPY N/A 05/24/2013   Procedure: ESOPHAGOGASTRODUODENOSCOPY (EGD);  Surgeon: Jerene Bears, MD;  Location: [facility];  Service: Gastroenterology;  Laterality: N/A;  . ESOPHAGOGASTRODUODENOSCOPY N/A 10/20/2014   Procedure: ESOPHAGOGASTRODUODENOSCOPY (EGD);  Surgeon: Lafayette Dragon, MD;  Location: [facility];  Service: Endoscopy;  Laterality: N/A;  . EUS  09/2015   antral CAVL, ablated; nodular erosive gastritis; mult pancreatic cysts rec rpt CT pancreas protocol or MRI to survey pancreas cysts 1-2 yrs (Dr Amie Critchley)  . HEMORROIDECTOMY    . HOT HEMOSTASIS  09/10/2012   Procedure: HOT HEMOSTASIS (ARGON PLASMA COAGULATION/BICAP);  Surgeon: Lafayette Dragon, MD;  Location: [facility];  Service: Endoscopy;  Laterality: N/A;  . HOT HEMOSTASIS N/A 05/24/2013   Procedure: HOT HEMOSTASIS (ARGON PLASMA COAGULATION/BICAP);  Surgeon: Jerene Bears, MD;  Location: [place] [facility];  Service: Gastroenterology;  Laterality: N/A;  . HOT HEMOSTASIS N/A 10/20/2014   Procedure: HOT HEMOSTASIS (ARGON PLASMA COAGULATION/BICAP);  Surgeon: Lafayette Dragon, MD;  Location: [facility];  Service: Endoscopy;  Laterality: N/A;  . US ECHOCARDIOGRAPHY  07/2014   mild LVH, EF 60-65%, normal wall motion, mild AR, mod dilated LA, mildly dilated RA, peak PA pressure 79mmHg  . VAGINAL HYSTERECTOMY      OB History    No data available       Home Medications    Prior to Admission medications   Medication Sig Start Date End Date Taking? Authorizing Provider  ADVAIR DISKUS 250-50 MCG/DOSE AEPB INHALE 1 PUFF INTO LUNGS TWICE A DAY 05/29/15   Ria Bush, MD  albuterol [facility] HFA) 108 (90 BASE) MCG/ACT inhaler Inhale 2 puffs  into the lungs every 6 (six) hours as needed for wheezing or shortness of breath. Reported on 08/18/2015    [provider]  ALPRAZolam Duanne Moron) 1 MG tablet TAKE 1/2 TO 1 TABLET TWICE A DAY AS NEEDED FOR ANXIETY AND SLEEP 02/27/16   Ria Bush, MD  B Complex-C ([facility] B COMPLEX PLUS C) TABS TAKE ONE CAPSULE BY MOUTH EVERY DAY 11/16/15   Ria Bush, MD  buPROPion (WELLBUTRIN XL) 150 MG 24 hr tablet Take 150 mg by mouth daily.    [provider]  clobetasol cream (TEMOVATE) 9.79 % Apply 1 application topically 2 (two) times daily.    [provider]  desvenlafaxine (PRISTIQ) 50 MG 24 hr tablet Take 3 tablets (150 mg total) by mouth daily. 05/12/15   Ria Bush, MD  diclofenac sodium (VOLTAREN) 1 % GEL Apply topically 3 (three) times daily as needed (pain).    [provider]  dicyclomine (BENTYL) 20 MG tablet Take 1 tablet (20 mg total) by mouth 2 (two) times daily. 04/07/16   Doristine Devoid,  PA-C  ferrous sulfate 325 (65 FE) MG tablet TAKE 1 TABLET BY MOUTH EVERY DAY WITH BREAKFAST 01/08/16   Armbruster, Carlota Raspberry, MD  fluconazole (DIFLUCAN) 150 MG tablet Take 150 mg by mouth as needed.    [provider]  glucose blood (ONE TOUCH ULTRA TEST) test strip Check blood sugar 4 times daily 10/16/15   Ria Bush, MD  guaiFENesin (MUCINEX) 600 MG 12 hr tablet Take 600 mg by mouth 2 (two) times daily.     [provider]  guaiFENesin-codeine 100-10 MG/5ML syrup Take 5 mLs by mouth 2 (two) times daily as needed for cough.  12/13/15   [provider]  Insulin Glargine (LANTUS SOLOSTAR) 100 UNIT/ML Solostar Pen Inject 60 Units into the skin every morning.    [provider]  insulin lispro (HUMALOG KWIKPEN) 100 UNIT/ML KiwkPen Inject 5-9 Units into the skin 2 (two) times daily as needed (CBG >300). Inject 5 units subcutaneously with lunch and supper for CBG >300, if CBG >350 at supper inject 8-9 units    [provider]  Insulin Pen Needle (B-D ULTRAFINE III SHORT PEN) 31G X 8 MM MISC Use as directed to inject insulin twice daily. Dx: E11.8 10/13/15   Renato Shin, MD  ipratropium-albuterol (DUONEB) 0.5-2.5 (3) MG/3ML SOLN Take 3 mLs by nebulization 2 (two) times daily. 03/25/16   Noralee Space, MD  isosorbide mononitrate (IMDUR) 30 MG 24 hr tablet Take 1 tablet (30 mg total) by mouth daily. 04/01/16   Richardson Dopp T, PA-C  Lactulose 20 GM/30ML SOLN Take 30 mLs (20 g total) by mouth 2 (two) times daily. But skip one dose if diarrhea 03/14/16   Tonia Ghent, MD  levalbuterol Penne Lash) 0.63 MG/3ML nebulizer solution Take 0.63 mg by nebulization every 8 (eight) hours as needed for wheezing or shortness of breath.  06/05/14   [provider]  loratadine (CLARITIN) 10 MG tablet TAKE 1 TABLET BY MOUTH EVERY DAY 07/14/15   Ria Bush, MD  metolazone (ZAROXOLYN) 2.5 MG tablet daily. TAKE 1 TABLET BY MOUTH  FOR 4 LB WEIGHT GAIN IN 24 HOURS    [provider]  Multiple Vitamin (MULTIVITAMIN WITH MINERALS) TABS tablet Take 1 tablet by mouth daily. Centrum Silver    [provider]  mupirocin cream (BACTROBAN) 2 % Apply 1 application topically 2 (two) times daily as needed (wound care). Reported on 11/14/2015    [provider]  nitroGLYCERIN (NITROSTAT) 0.4 MG SL tablet Place 1 tablet (0.4 mg total) under the tongue every 5 (five) minutes as needed for chest pain. No more than 3 in 15 minutes 02/16/16   Sherren Mocha, MD  nystatin (MYCOSTATIN) 100000 UNIT/ML suspension Take 5 mLs (500,000 Units total) by mouth 4 (four) times daily. X 7DAYS 03/02/16   Rai, Vernelle Emerald, MD  [facility] DELICA LANCETS 24M MISC USE TO CHECK SUGAR 4 TIMES DAILY AND AS NEEDED. PN:T61.44 **ONE TOUCH DELICA** 11/08/38   Ria Bush, MD  oxyCODONE (OXY IR/ROXICODONE) 5 MG immediate release tablet Take 1 tablet (5 mg total) by mouth every 6 (six) hours as needed for moderate pain or severe pain.  02/28/16   Ria Bush, MD  OXYGEN Inhale 3-4 L into the lungs continuous.    [provider]  pantoprazole (PROTONIX) 40 MG tablet TAKE 1 TABLET BY MOUTH TWICE A DAY 10/12/15   Ria Bush, MD  Polyethyl Glycol-Propyl Glycol (SYSTANE OP) Place 1 drop into both eyes daily.    [provider]  polyethylene glycol (MIRALAX) packet Take 17 g by mouth daily. 04/07/16   Doristine Devoid, PA-C  potassium chloride SA (K-DUR,KLOR-CON) 20 MEQ tablet Take 40 mEq by mouth 2 (two) times daily.     [provider]  promethazine (PHENERGAN) 25 MG tablet Take 25 mg by mouth 2 (two) times daily as needed for nausea or vomiting.    [provider]  QUEtiapine (SEROQUEL) 200 MG tablet TAKE 1 TABLET BY MOUTH AT BEDTIME 12/22/15   Ria Bush, MD  rOPINIRole (REQUIP) 4 MG tablet TAKE 1 TABLET BY MOUTH AT BEDTIME 02/12/16   Ria Bush, MD  simvastatin (ZOCOR) 40 MG tablet Take 40 mg by mouth at bedtime.     [provider]  sodium chloride (OCEAN) 0.65 % SOLN nasal spray Place 1 spray into both nostrils 2 (two) times daily as needed for congestion.    [provider]  torsemide (DEMADEX) 20 MG tablet Take 40 mg by mouth daily.    [provider]  traMADol (ULTRAM) 50 MG tablet Take by mouth every 6 (six) hours as needed.    [provider]  Vitamin D, Ergocalciferol, (DRISDOL) 50000 units CAPS capsule Take 50,000 Units by mouth every Friday.  01/29/16   [provider]    Family History Family History  Problem Relation Age of Onset  . Heart disease Mother   . Cervical cancer Mother   . Kidney disease Mother   . Diabetes Mother   . Heart attack Mother   . Diabetes Father   . Heart attack Father   . Multiple sclerosis Other   . Breast cancer Sister   . Multiple sclerosis Sister   . Breast cancer Maternal Aunt        x 2  . Diabetes Sister   . Stroke Daughter   . Stroke Daughter   . Lupus Daughter   .  Colon cancer Neg Hx     Social History Social History  Substance Use Topics  . Smoking status: Former Smoker    Packs/day: 1.50    Years: 25.00    Types: Cigarettes    Quit date: 08/26/1992  . Smokeless tobacco: Never Used  . Alcohol use No     Comment: 5/00/93 "last alcoholic drink was years ago"     Allergies   Acetaminophen; Nsaids; Aspirin; Theophylline; Penicillin g; and Penicillins   Review of Systems Review of Systems  Constitutional: Negative for fever.  Respiratory: Negative for chest tightness and shortness of breath.   All other systems reviewed and are negative.    Physical Exam Updated Vital Signs BP (!) 112/59 (BP Location: Left Arm)   Pulse 62   Temp 98.2 F (36.8 C) (Rectal)   Resp 18   Ht 1.575 m (5\' 2" )   Wt 72.6 kg (160 lb)   SpO2 100%   BMI 29.26 kg/m   Physical Exam  Constitutional: No distress.  Elderly, frail  HENT:  Head: Normocephalic and atraumatic.  Right Ear: External ear normal.  Left Ear: External ear normal.  Eyes: Conjunctivae are normal. Right eye exhibits no discharge. Left eye exhibits no discharge. No scleral icterus.  Neck: Neck supple. No tracheal deviation present.  Cardiovascular: Normal rate, regular rhythm and intact distal pulses.   Pulmonary/Chest: Effort normal and breath sounds normal. No stridor. No respiratory distress. She has no wheezes. She has no rales.  Abdominal: Soft. Bowel sounds are normal. She exhibits no distension. There is no tenderness. There is no rebound  and no guarding.  Musculoskeletal: She exhibits no edema or tenderness.  Neurological: She is alert. No cranial nerve deficit (no facial droop, extraocular movements intact, no slurred speech) or sensory deficit. She exhibits normal muscle tone. She displays no seizure activity. Coordination normal.  Generalized weakness  Skin: Skin is warm and dry. No rash noted. There is pallor.  Psychiatric: She has a normal mood and affect.  Nursing note and  vitals reviewed.    ED Treatments / Results  Labs (all labs ordered are listed, but only abnormal results are displayed) Labs Reviewed  POC OCCULT BLOOD, ED - Abnormal; Notable for the following:       Result Value   Fecal Occult Bld POSITIVE (*)    All other components within normal limits  COMPREHENSIVE METABOLIC PANEL  CBC WITH DIFFERENTIAL/PLATELET  PROTIME-INR  PREPARE RBC (CROSSMATCH)  TYPE AND SCREEN      Radiology No results found.  Procedures Procedures (including critical care time)  Medications Ordered in ED Medications  0.9 %  sodium chloride infusion (not administered)     Initial Impression / Assessment and Plan / ED Course  I have reviewed the triage vital signs and the nursing notes.  Pertinent labs & imaging results that were available during my care of the patient were reviewed by me and considered in my medical decision making (see chart for details).   patient presents to the emergency room for evaluation and treatment of recurrent anemia. Patient has a known history of GAVE causing recurrent GI bleeding. Patient appears hemodynamically stable.  Plan on rechecking her labs however she will likely require admission to hospital for blood transfusions  Final Clinical Impressions(s) / ED Diagnoses   Final diagnoses:  Gastrointestinal hemorrhage, unspecified gastrointestinal hemorrhage type  Symptomatic anemia    New Prescriptions New Prescriptions   No medications on file     Dorie Rank, MD 05/16/17 [phone]

## 2017-05-15 NOTE — Telephone Encounter (Signed)
Agree with your recommendation, needs follow up at [facility] for radiofrequency ablation of GAVE. Otherwise she needs PRBC transfusion given level of Hgb, which is managed by her primary care - unclear if she can get this done quickly as outpatient today or if she needs to go to the hospital. If she has symptoms of active GI bleeding she needs to go to the hospital in the interim (please ensure she does not have symptoms of bleeding, she has not had that in the past)

## 2017-05-15 NOTE — ED Triage Notes (Signed)
Per EMS, patient comes from [facility]. She was scheduled for an OP blood transfusion today but they wouldn't take her stating her hemoglobin was "too low" (5.9) Pt alert, oriented x2. DNR.

## 2017-05-15 NOTE — Telephone Encounter (Signed)
Spoke to Monico Blitz, NP, states patient is not having any active bleeding. They have contacted [facility], they do not have anything available until January 2019. They have put in a call to Dr. Newman Pies to see about getting patient in sooner. They are waiting to hear from Dr. Newman Pies and plan for blood transfusions. I have asked them to contact our office to give update on plan for this patient.

## 2017-05-15 NOTE — ED Provider Notes (Addendum)
.   Please see previous physicians note regarding patient's presenting history and physical, initial ED course, and associated medical decision making.  blood work reviewed, hgb 5.7. 2 units of blood ordered, but unable to match blood due to specific antigens. Plan for blood to be shipped form out of the health system, but can take 6-8 hours. Blood pressure recently soft, high 90s SBP. Discussed with hospitalist service, who will admit for transfusion.   CRITICAL CARE Performed by: Forde Dandy   Total critical care time: 31 minutes  Critical care time was exclusive of separately billable procedures and treating other patients.  Critical care was necessary to treat or prevent imminent or life-threatening deterioration.  Critical care was time spent personally by me on the following activities: development of treatment plan with patient and/or surrogate as well as nursing, discussions with consultants, evaluation of patient's response to treatment, examination of patient, obtaining history from patient or surrogate, ordering and performing treatments and interventions, ordering and review of laboratory studies, ordering and review of radiographic studies, pulse oximetry and re-evaluation of patient's condition.    Forde Dandy, MD 05/15/17 1741    Forde Dandy, MD 05/15/17 [phone]

## 2017-05-15 NOTE — H&P (Signed)
History and Physical    DWAN FENNEL RJJ:[id number] DOB: 11-13-1940 DOA: 05/15/2017  I have briefly reviewed the patient's prior medical records in [place]  PCP: Garwin Brothers, MD  Patient coming from: SNF  Chief Complaint: Weakness, anemia  HPI: [name] is a 76 y.o. 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  Patient's family who is at bedside (daughter and sister), tells me that she has been more sleepy and more confused than usual.  Patient also has a history of chronic urinary tract infections with ESBL and she is on Invanz intermittently, most recent completed a course looks like few days ago.  Patient currently complains of weakness, sleepiness.  She denies any chest pain.  She complains of shortness of breath, however states that this is chronic.  She has no abdominal pain, no nausea or vomiting.  She has no diarrhea.  Patient and family deny any evidence of bright red blood in her stool or melena.  Patient is able to answer basic orientation questions but thinks the year is 20/20.  ED Course: In the emergency room her vital signs are stable, she is afebrile and heart rate is on the bradycardic side.  Blood work reveals a creatinine of 1.39, hemoglobin of 5.7, and platelets of 55.  She was ordered 2 units of packed red blood cells, and TRH was asked for admission  Review of Systems: Unable to accurately obtain full review of systems due to mild encephalopathy  Past Medical History:  Diagnosis Date  . Adenomatous colon polyp   . Anxiety   . Asthma   . Back pain   . Benign neoplasm of colon   . CAP  (community acquired pneumonia) 2015  . Carpal tunnel syndrome on both sides   . Chronic airway obstruction, not elsewhere classified   . Chronic diastolic CHF (congestive heart failure) ([place])    a. 03/26/2014 Echo: EF 55-60%, no rwma, mild AS/AI, mod-sev Ca2+ MV annulus, mildly to mod dil LA.  [name] Coarse tremors    a. arms.  . Diverticulosis of colon (without mention of hemorrhage)   . Esophageal reflux   . Falls frequently    completed HHPT/OT 06/2014, PT unmet goals  . GAVE (gastric antral vascular ectasia)    angiodysplasia; s/p multiple APCs, discussing RFA with [facility] GI Dr Newman Pies (06/2015)  . H/O hiatal hernia   . Hepatitis   . Hiatal hernia   . Iron deficiency anemia secondary to blood loss (chronic)    a. frequent PRBC transfusions.  . Liver cirrhosis secondary to nonalcoholic steatohepatitis (NASH) ([place])    a. dx'd 1990's  . Major depressive disorder, recurrent episode, severe, specified as with psychotic behavior   . Memory loss   . Midsternal chest pain    a. conservatively managed ->poor candidate for cath/anticoagulation.  . Mild aortic stenosis    a. 03/2014 Valve area (VTI): 2.89 cm^2, Valve area (Vmax): 2.7 cm^2.  . Morbid obesity ([place])   . Neck pain   . Non-obstructive CAD   . On home oxygen therapy    "3L; 24/7" (11/29/2015)  . OSA (obstructive sleep apnea)  a. does not use CPAP. (01/25/2015)  . Osteoarthrosis, unspecified whether generalized or localized, unspecified site   . Osteoporosis, unspecified   . Palpitations   . Portal hypertensive gastropathy ([place])   . Protein calorie malnutrition ([place])   . Pure hypercholesterolemia   . Recurrent UTI (urinary tract infection)    h/o hospitalization with urosepsis 2015, but thought large component colonization/bacteriuria, only treat if symptomatic ([place])  . Restless legs syndrome (RLS)   . Thrombocytopenia ([place])   . Type II diabetes mellitus ([place])   . Unspecified chronic bronchitis ([place])   . Unspecified  essential hypertension     Past Surgical History:  Procedure Laterality Date  . APPENDECTOMY    . APPENDECTOMY    . BALLOON DILATION  06/12/2012   Procedure: BALLOON DILATION;  Surgeon: Lafayette Dragon, MD;  Location: [facility] ENDOSCOPY;  Service: Endoscopy;  Laterality: N/A;  ?balloon  . BREAST BIOPSY     bilateral  . CESAREAN SECTION     x 3  . ENTEROSCOPY N/A 02/25/2015   Procedure: ENTEROSCOPY;  Surgeon: [name] Ada, MD;  Location: [facility] ENDOSCOPY;  Service: Endoscopy;  Laterality: N/A;  . ENTEROSCOPY N/A 06/27/2015   Procedure: ENTEROSCOPY;  Surgeon: Manus Gunning, MD;  Location: [facility] ENDOSCOPY;  Service: Gastroenterology;  Laterality: N/A;  . ESOPHAGOGASTRODUODENOSCOPY  06/12/2012   Procedure: ESOPHAGOGASTRODUODENOSCOPY (EGD);  Surgeon: Lafayette Dragon, MD;  Location: [facility];  Service: Endoscopy;  Laterality: N/A;  . ESOPHAGOGASTRODUODENOSCOPY  09/10/2012   Procedure: ESOPHAGOGASTRODUODENOSCOPY (EGD);  Surgeon: Lafayette Dragon, MD;  Location: [facility];  Service: Endoscopy;  Laterality: N/A;  . ESOPHAGOGASTRODUODENOSCOPY N/A 01/18/2013   Procedure: ESOPHAGOGASTRODUODENOSCOPY (EGD);  Surgeon: Lafayette Dragon, MD;  Location: [facility] ENDOSCOPY;  Service: Endoscopy;  Laterality: N/A;  . ESOPHAGOGASTRODUODENOSCOPY N/A 05/24/2013   Procedure: ESOPHAGOGASTRODUODENOSCOPY (EGD);  Surgeon: Jerene Bears, MD;  Location: [place];  Service: Gastroenterology;  Laterality: N/A;  . ESOPHAGOGASTRODUODENOSCOPY N/A 10/20/2014   Procedure: ESOPHAGOGASTRODUODENOSCOPY (EGD);  Surgeon: Lafayette Dragon, MD;  Location: [facility];  Service: Endoscopy;  Laterality: N/A;  . EUS  09/2015   antral CAVL, ablated; nodular erosive gastritis; mult pancreatic cysts rec rpt CT pancreas protocol or MRI to survey pancreas cysts 1-2 yrs (Dr Amie Critchley)  . HEMORROIDECTOMY    . HOT HEMOSTASIS  09/10/2012   Procedure: HOT HEMOSTASIS (ARGON PLASMA COAGULATION/BICAP);  Surgeon: Lafayette Dragon, MD;  Location: [facility];  Service:  Endoscopy;  Laterality: N/A;  . HOT HEMOSTASIS N/A 05/24/2013   Procedure: HOT HEMOSTASIS (ARGON PLASMA COAGULATION/BICAP);  Surgeon: Jerene Bears, MD;  Location: [place];  Service: Gastroenterology;  Laterality: N/A;  . HOT HEMOSTASIS N/A 10/20/2014   Procedure: HOT HEMOSTASIS (ARGON PLASMA COAGULATION/BICAP);  Surgeon: Lafayette Dragon, MD;  Location: [facility];  Service: Endoscopy;  Laterality: N/A;  . US ECHOCARDIOGRAPHY  07/2014   mild LVH, EF 60-65%, normal wall motion, mild AR, mod dilated LA, mildly dilated RA, peak PA pressure 53mmHg  . VAGINAL HYSTERECTOMY       reports that she quit smoking about 24 years ago. Her smoking use included Cigarettes. She has a 37.50 pack-year smoking history. She has never used smokeless tobacco. She reports that she does not drink alcohol or use drugs.  Allergies  Allergen Reactions  . Acetaminophen Other (See Comments)    Liver dysfunction  . Nsaids Other (See Comments)    Liver dysfunction   . Aspirin Other (See Comments)    Causes nosebleeds  . Theophylline Nausea And Vomiting  .  Penicillin G Itching  . Penicillins Itching    Has patient had a PCN reaction causing immediate rash, facial/tongue/throat swelling, SOB or lightheadedness with hypotension: unknown Has patient had a PCN reaction causing severe rash involving mucus membranes or skin necrosis: unknown Has patient had a PCN reaction that required hospitalization unknown Has patient had a PCN reaction occurring within the last 10 years: yes If all of the above answers are "NO", then may proceed with Cephalosporin use.     Family History  Problem Relation Age of Onset  . Heart disease Mother   . Cervical cancer Mother   . Kidney disease Mother   . Diabetes Mother   . Heart attack Mother   . Diabetes Father   . Heart attack Father   . Multiple sclerosis Other   . Breast cancer Sister   . Multiple sclerosis Sister   . Breast cancer Maternal Aunt        x 2  . Diabetes  Sister   . Stroke Daughter   . Stroke Daughter   . Lupus Daughter   . Colon cancer Neg Hx     Prior to Admission medications   Medication Sig Start Date End Date Taking? Authorizing Provider  ADVAIR DISKUS 250-50 MCG/DOSE AEPB INHALE 1 PUFF INTO LUNGS TWICE A DAY 05/29/15  Yes Ria Bush, MD  ALPRAZolam (XANAX) 1 MG tablet TAKE 1/2 TO 1 TABLET TWICE A DAY AS NEEDED FOR ANXIETY AND SLEEP Patient taking differently: 1 mg by mouth q 12 for anxiety. 02/27/16  Yes Ria Bush, MD  atorvastatin (LIPITOR) 20 MG tablet Take 20 mg by mouth daily at 6 PM.   Yes [provider]  B Complex-C ([employer] B COMPLEX PLUS C) TABS TAKE ONE CAPSULE BY MOUTH EVERY DAY 11/16/15  Yes Ria Bush, MD  buPROPion (WELLBUTRIN XL) 150 MG 24 hr tablet Take 150 mg by mouth daily.   Yes [provider]  Darbepoetin Alfa (ARANESP) 200 MCG/0.4ML SOSY injection Inject 200 mcg into the skin every 7 (seven) days. Friday.   Yes [provider]  dicyclomine (BENTYL) 20 MG tablet Take 1 tablet (20 mg total) by mouth 2 (two) times daily. 04/07/16  Yes Doristine Devoid, PA-C  ferrous gluconate (FERGON) 324 MG tablet Take 324 mg by mouth 3 (three) times daily with meals.   Yes [provider]  fexofenadine-pseudoephedrine (ALLEGRA-D 24) 180-240 MG 24 hr tablet Take 1 tablet by mouth daily.   Yes [provider]  Insulin Glargine (LANTUS SOLOSTAR) 100 UNIT/ML Solostar Pen Inject 60 Units into the skin every morning.   Yes [provider]  insulin lispro (HUMALOG KWIKPEN) 100 UNIT/ML KiwkPen Inject 0-10 Units into the skin 4 (four) times daily -  before meals and at bedtime. 0-150=0 151-200=2 201-250=4 251-300=6 301-350=8 351-400=10   Yes [provider]  isosorbide mononitrate (IMDUR) 30 MG 24 hr tablet Take 1 tablet (30 mg total) by mouth daily. 04/01/16  Yes Weaver, Scott T, PA-C  Lactulose 20 GM/30ML SOLN Take 30 mLs (20 g total) by mouth 2 (two) times  daily. But skip one dose if diarrhea Patient taking differently: Take 20 g by mouth 3 (three) times daily. But skip one dose if diarrhea 03/14/16  Yes Tonia Ghent, MD  levalbuterol Penne Lash) 0.63 MG/3ML nebulizer solution Take 0.63 mg by nebulization 2 (two) times daily.  06/05/14  Yes [provider]  Multiple Vitamin (MULTIVITAMIN WITH MINERALS) TABS tablet Take 1 tablet by mouth daily.  Yes [provider]  OXYGEN Inhale 2 L into the lungs continuous.    Yes [provider]  pantoprazole (PROTONIX) 40 MG tablet TAKE 1 TABLET BY MOUTH TWICE A DAY Patient taking differently: Take 40 mg by mouth every morning. 10/12/15  Yes Ria Bush, MD  polyethylene glycol [facility]) packet Take 17 g by mouth daily. 04/07/16  Yes Leaphart, Chrissie Noa T, PA-C  potassium chloride SA (K-DUR,KLOR-CON) 20 MEQ tablet Take 40 mEq by mouth daily.    Yes [provider]  Propylene Glycol (SYSTANE BALANCE) 0.6 % SOLN Place 1 drop into both eyes every morning.   Yes [provider]  QUEtiapine (SEROQUEL) 200 MG tablet TAKE 1 TABLET BY MOUTH AT BEDTIME 12/22/15  Yes Ria Bush, MD  QUEtiapine (SEROQUEL) 50 MG tablet Take 50 mg by mouth 2 (two) times daily.   Yes [provider]  rifaximin (XIFAXAN) 550 MG TABS tablet Take 550 mg by mouth 2 (two) times daily.   Yes [provider]  rOPINIRole (REQUIP) 4 MG tablet TAKE 1 TABLET BY MOUTH AT BEDTIME 02/12/16  Yes Ria Bush, MD  saccharomyces boulardii (FLORASTOR) 250 MG capsule Take 250 mg by mouth 2 (two) times daily.   Yes [provider]  torsemide (DEMADEX) 20 MG tablet Take 40 mg by mouth daily.   Yes [provider]  traMADol (ULTRAM) 50 MG tablet Take 50 mg by mouth 2 (two) times daily. Not to be given with Xanax.   Yes [provider]  triamcinolone cream (KENALOG) 0.1 % Apply 1 application topically 2 (two) times daily. To left area under stomach for yeast.    Yes [provider]  venlafaxine XR (EFFEXOR-XR) 75 MG 24 hr capsule Take 75 mg by mouth daily with breakfast.   Yes [provider]  Vitamin D, Ergocalciferol, (DRISDOL) 50000 units CAPS capsule Take 50,000 Units by mouth every Friday.  01/29/16  Yes [provider]  albuterol (PROAIR HFA) 108 (90 BASE) MCG/ACT inhaler Inhale 2 puffs into the lungs every 6 (six) hours as needed for wheezing or shortness of breath. Reported on 08/18/2015    [provider]  clobetasol cream (TEMOVATE) 0.93 % Apply 1 application topically 2 (two) times daily.    [provider]  diclofenac sodium (VOLTAREN) 1 % GEL Apply topically 3 (three) times daily as needed (pain).    [provider]  ertapenem (INVANZ) 1 g injection Inject 1 g into the muscle at bedtime.    [provider]  fluconazole (DIFLUCAN) 150 MG tablet Take 150 mg by mouth as needed.    [provider]  glucose blood (ONE TOUCH ULTRA TEST) test strip Check blood sugar 4 times daily 10/16/15   Ria Bush, MD  guaiFENesin (MUCINEX) 600 MG 12 hr tablet Take 600 mg by mouth 2 (two) times daily.     [provider]  guaiFENesin-codeine 100-10 MG/5ML syrup Take 5 mLs by mouth 2 (two) times daily as needed for cough.  12/13/15   [provider]  Insulin Pen Needle (B-D ULTRAFINE III SHORT PEN) 31G X 8 MM MISC Use as directed to inject insulin twice daily. Dx: E11.8 10/13/15   Renato Shin, MD  ipratropium-albuterol (DUONEB) 0.5-2.5 (3) MG/3ML SOLN Take 3 mLs by nebulization 2 (two) times daily. 03/25/16   Noralee Space, MD  loratadine (CLARITIN) 10 MG tablet TAKE 1 TABLET BY MOUTH EVERY DAY 07/14/15   Ria Bush, MD  metolazone (ZAROXOLYN) 2.5 MG tablet daily. TAKE  1 TABLET BY MOUTH  FOR 4 LB WEIGHT GAIN IN 24 HOURS    [provider]  mupirocin cream (BACTROBAN) 2 % Apply 1 application topically 2 (two) times daily as needed (wound care). Reported on  11/14/2015    [provider]  nitroGLYCERIN (NITROSTAT) 0.4 MG SL tablet Place 1 tablet (0.4 mg total) under the tongue every 5 (five) minutes as needed for chest pain. No more than 3 in 15 minutes 02/16/16   Sherren Mocha, MD  nystatin (MYCOSTATIN) 100000 UNIT/ML suspension Take 5 mLs (500,000 Units total) by mouth 4 (four) times daily. X 7DAYS 03/02/16   Rai, Vernelle Emerald, MD  [facility] DELICA LANCETS 81W MISC USE TO CHECK SUGAR 4 TIMES DAILY AND AS NEEDED. EX:H37.16 **ONE TOUCH DELICA** 9/67/89   Ria Bush, MD  oxyCODONE (OXY IR/ROXICODONE) 5 MG immediate release tablet Take 1 tablet (5 mg total) by mouth every 6 (six) hours as needed for moderate pain or severe pain. 02/28/16   Ria Bush, MD  Polyethyl Glycol-Propyl Glycol (SYSTANE OP) Place 1 drop into both eyes daily.    [provider]  promethazine (PHENERGAN) 25 MG tablet Take 25 mg by mouth 2 (two) times daily as needed for nausea or vomiting.    [provider]  simvastatin (ZOCOR) 40 MG tablet Take 40 mg by mouth at bedtime.     [provider]  sodium chloride (OCEAN) 0.65 % SOLN nasal spray Place 1 spray into both nostrils 2 (two) times daily as needed for congestion.    [provider]    Physical Exam: Vitals:   05/15/17 1619 05/15/17 1700 05/15/17 1715 05/15/17 1730  BP:  (!) 96/59  (!) 108/44  Pulse:  60 (!) 57 (!) 59  Resp:  18 17 18   Temp: 98.2 F (36.8 C)     TempSrc: Rectal     SpO2:  97% 99% 99%  Weight:      Height:          Constitutional: Pale appearing Caucasian female Eyes: PERRL, lids and conjunctivae normal ENMT: Mucous membranes are dry. Posterior pharynx clear of any exudate or lesions.  No dentition.  Neck: normal, supple Respiratory: clear to auscultation bilaterally, no wheezing, no crackles. Normal respiratory effort. No accessory muscle use.  Cardiovascular: Regular rate and rhythm, 3/6 SEM. No extremity edema. 2+ pedal pulses.  Abdomen: no  tenderness, no masses palpated. Bowel sounds positive.  Musculoskeletal: no clubbing / cyanosis. Normal muscle tone.  Skin: no rashes, lesions, ulcers. No induration Neurologic: nonfocal, cranial nerves grossly intact.  Asterixis is present Psychiatric: Normal judgment and insight. Alert and oriented x 2, not to year. Normal mood.   Labs on Admission: I have personally reviewed following labs and imaging studies  CBC:  Recent Labs Lab 05/15/17 1601  WBC 1.9*  NEUTROABS 1.4*  HGB 5.7*  HCT 18.5*  MCV 102.8*  PLT 55*   Basic Metabolic Panel:  Recent Labs Lab 05/15/17 1601  NA 141  K 4.0  CL 111  CO2 27  GLUCOSE 145*  BUN 27*  CREATININE 1.39*  CALCIUM 8.4*   GFR: Estimated Creatinine Clearance: 33.6 mL/min (A) (by C-G formula based on SCr of 1.39 mg/dL (H)). Liver Function Tests:  Recent Labs Lab 05/15/17 1601  AST 43*  ALT 32  ALKPHOS 111  BILITOT 1.2  PROT 4.9*  ALBUMIN 2.7*   No results for input(s): LIPASE, AMYLASE in the last 168 hours. No results for input(s): AMMONIA in the last  168 hours. Coagulation Profile:  Recent Labs Lab 05/15/17 1601  INR 1.15   Cardiac Enzymes: No results for input(s): CKTOTAL, CKMB, CKMBINDEX, TROPONINI in the last 168 hours. BNP (last 3 results) No results for input(s): PROBNP in the last 8760 hours. HbA1C: No results for input(s): HGBA1C in the last 72 hours. CBG: No results for input(s): GLUCAP in the last 168 hours. Lipid Profile: No results for input(s): CHOL, HDL, LDLCALC, TRIG, CHOLHDL, LDLDIRECT in the last 72 hours. Thyroid Function Tests: No results for input(s): TSH, T4TOTAL, FREET4, T3FREE, THYROIDAB in the last 72 hours. Anemia Panel: No results for input(s): VITAMINB12, FOLATE, FERRITIN, TIBC, IRON, RETICCTPCT in the last 72 hours. Urine analysis:    Component Value Date/Time   COLORURINE YELLOW 04/07/2016 2000   APPEARANCEUR CLEAR 04/07/2016 2000   LABSPEC 1.010 04/07/2016 2000   PHURINE 6.0  04/07/2016 2000   GLUCOSEU NEGATIVE 04/07/2016 2000   GLUCOSEU NEGATIVE 07/17/2015 0907   HGBUR TRACE (A) 04/07/2016 2000   BILIRUBINUR NEGATIVE 04/07/2016 2000   BILIRUBINUR negative 02/26/2016 1206   KETONESUR NEGATIVE 04/07/2016 2000   PROTEINUR 30 (A) 04/07/2016 2000   UROBILINOGEN 4.0 02/26/2016 1206   UROBILINOGEN 0.2 07/17/2015 0907   NITRITE NEGATIVE 04/07/2016 2000   LEUKOCYTESUR NEGATIVE 04/07/2016 2000    Radiological Exams on Admission: No results found.  Assessment/Plan Active Problems:   HYPERCHOLESTEROLEMIA   Iron deficiency anemia due to chronic blood loss   Essential hypertension   Aortic valve disorder   Angiodysplasia of intestine   Autoimmune hepatitis (HCC)   Fibromyalgia   Obesity, Class II, BMI 35-39.9, with comorbidity   GAVE (gastric antral vascular ectasia)   Other pancytopenia (HCC)   CKD stage 3 secondary to diabetes ([place])   DNR (do not resuscitate)   Liver cirrhosis secondary to NASH ([place])   COPD with chronic bronchitis (HCC)   Chronic hypoxemic respiratory failure (HCC)   Chronic diastolic CHF (congestive heart failure) (HCC)   Diabetes (HCC)   Symptomatic anemia   Acute hepatic encephalopathy   Symptomatic anemia, iron deficiency anemia due to chronic blood loss -In the setting of chronic GI losses with known GAVE.  Patient has been followed at [facility] and has had several sessions of radiofrequency ablation.  She is scheduled to have the next one in January, however per family they are working her to get the procedure done within the next week. -We will admit patient to the hospital, she has had multiple transfusions in the past and has developed antibiotics, unable to manage blood here and blood bank is looking for units outside of our health system so per EDP may take 6-8 hours  Acute encephalopathy -Likely a component of hepatic encephalopathy given presence of asterixis.  Will check an ammonia level and continue her home  lactulose and rifaximin.  Anemia may also contribute, will monitor mental status after blood transfusion  Liver cirrhosis, with decompensation -Continue home medications, she is also thrombocytopenic and appears to have a degree of hepatic encephalopathy as well as GI bleed  Thrombocytopenia -Due to liver cirrhosis  COPD with chronic hypoxic respiratory failure -Currently without wheezing, at baseline home oxygen, continue home medications and nebulizers as needed  Type 2 diabetes mellitus -Resume home Lantus at a lower dose, add sliding scale insulin  Restless legs -Continue home Requip  Chronic kidney disease stage III -Creatinine appears at baseline, avoid nephrotoxins  Recurrent UTIs -Check a UA, not treat unless she is febrile or has significant symptoms consistent with  a UTI stop has history of ESBL  Chronic diastolic CHF -She appears euvolemic.  We will give gentle hydration overnight given blood pressure of 90-low 100s, resume diuretics in the morning as she will get blood transfusions as well  Depression with psychotic features -Appears stable, continue home Seroquel   DVT prophylaxis: SCD  Code Status: DNR  Family Communication: d/w daughter and sister bedside Disposition Plan: admit to medsurg Consults called: none     Admission status: observation   At the point of initial evaluation, it is my clinical opinion that admission for OBSERVATION is reasonable and necessary because the patient's presenting complaints in the context of their chronic conditions represent sufficient risk of deterioration or significant morbidity to constitute reasonable grounds for close observation in the hospital setting, but that the patient may be medically stable for discharge from the hospital within 24 to 48 hours.     Marzetta Board, MD [facility] Pager [phone]  If 7PM-7AM, please contact night-coverage [email or web address] Password [email or web address]  05/15/2017, 6:18 PM

## 2017-05-15 NOTE — Clinical Social Work Note (Signed)
Clinical Social Work Assessment  Patient Details  Name: [name] MRN: [id number] Date of Birth: May 28, 1941  Date of referral:  05/15/17               Reason for consult:  Facility Placement                Permission sought to share information with:  Facility Art therapist granted to share information::  Yes, Verbal Permission Granted  Name::        Agency::     Relationship::     Contact Information:     Housing/Transportation Living arrangements for the past 2 months:  [place] of Information:  Patient Patient Interpreter Needed:  None Criminal Activity/Legal Involvement Pertinent to Current Situation/Hospitalization:    Significant Relationships:  Adult Children, Siblings Lives with:  Facility Resident Do you feel safe going back to the place where you live?  Yes Need for family participation in patient care:  Yes (Comment)  Care giving concerns:  Pt's sister [name] and daughter [name] asked to be consultd on all D/C decisions.   Social Worker assessment / plan: CSW met with pt's daughter and sister and confirmed pt's plan to be discharged back to [facility] SNF to live at discharge.  CSW provided active listening and validated pt's sister's concerns.   CSW DEPT was given permission to complete FL-2 and send referral back out to [facility] facility via the hub per pt's request.  Pt has been living at [facility] for 14 months, prior to being admitted to [facility].  Pt's sister [name] and daughter [name] asked to be consultd on all D/C decisions.   Employment status:  Retired Nurse, adult PT Recommendations:  Not assessed at this time Information / Referral to community resources:      Patient/Family's Response to care:  Patient not alert and oriented.  Patient's sister agreeable to plan.  Pt's sister [name] and daughter supportive and strongly involved in pt.'s care.  Pt.'s  sister and daughter pleasant and appreciated CSW intervention.    Pt's sister [name] and daughter [name] asked to be consultd on all D/C decisions.    Patient/Family's Understanding of and Emotional Response to Diagnosis, Current Treatment, and Prognosis:  Still assessing  Emotional Assessment Appearance:    Attitude/Demeanor/Rapport:  Unable to Assess Affect (typically observed):    Orientation:  Fluctuating Orientation (Suspected and/or reported Sundowners) Alcohol / Substance use:    Psych involvement (Current and /or in the community):     Discharge Needs  Concerns to be addressed:  No discharge needs identified Readmission within the last 30 days:  No Current discharge risk:  None Barriers to Discharge:  No Barriers Identified   Claudine Mouton, LCSWA 05/15/2017, 7:26 PM

## 2017-05-15 NOTE — Telephone Encounter (Signed)
Patient daughter states pt hemoglobin has dropped to 6 and she needs the number to [facility] for her to sch another radiofrequency ablation.

## 2017-05-15 NOTE — Progress Notes (Signed)
Consult request has been received. CSW attempting to follow up at present time.  CSW completed assessment with the pt.   Please reconsult if future social work needs arise.  CSW signing off, as social work intervention is no longer needed.   [name]. Javi Bollman, Reed Pandy, CSI Clinical Social Worker Ph: [phone]

## 2017-05-15 NOTE — Telephone Encounter (Signed)
Spoke to patient's daughter, [name], Hgb 6.0. She said that Monico Blitz, NP asked her to get number to [facility] in order to set up radiofrequency ablation. Ms. Linton Rump is also managing blood transfusions. Patient is established with Dr. Steward Drone at [phone]. Asked her to call back if there is a problem in getting scheduled with him.

## 2017-05-16 DIAGNOSIS — J9611 Chronic respiratory failure with hypoxia: Secondary | ICD-10-CM | POA: Diagnosis not present

## 2017-05-16 DIAGNOSIS — K754 Autoimmune hepatitis: Secondary | ICD-10-CM | POA: Diagnosis not present

## 2017-05-16 DIAGNOSIS — J449 Chronic obstructive pulmonary disease, unspecified: Secondary | ICD-10-CM | POA: Diagnosis not present

## 2017-05-16 DIAGNOSIS — I359 Nonrheumatic aortic valve disorder, unspecified: Secondary | ICD-10-CM | POA: Diagnosis not present

## 2017-05-16 DIAGNOSIS — D649 Anemia, unspecified: Secondary | ICD-10-CM | POA: Diagnosis not present

## 2017-05-16 DIAGNOSIS — K7200 Acute and subacute hepatic failure without coma: Secondary | ICD-10-CM | POA: Diagnosis not present

## 2017-05-16 DIAGNOSIS — Z794 Long term (current) use of insulin: Secondary | ICD-10-CM

## 2017-05-16 DIAGNOSIS — E1122 Type 2 diabetes mellitus with diabetic chronic kidney disease: Secondary | ICD-10-CM | POA: Diagnosis not present

## 2017-05-16 DIAGNOSIS — I5032 Chronic diastolic (congestive) heart failure: Secondary | ICD-10-CM | POA: Diagnosis not present

## 2017-05-16 DIAGNOSIS — K922 Gastrointestinal hemorrhage, unspecified: Secondary | ICD-10-CM | POA: Diagnosis not present

## 2017-05-16 DIAGNOSIS — K5520 Angiodysplasia of colon without hemorrhage: Secondary | ICD-10-CM | POA: Diagnosis not present

## 2017-05-16 DIAGNOSIS — N183 Chronic kidney disease, stage 3 (moderate): Secondary | ICD-10-CM | POA: Diagnosis not present

## 2017-05-16 LAB — TSH: TSH: 0.467 u[IU]/mL (ref 0.350–4.500)

## 2017-05-16 LAB — BLOOD GAS, ARTERIAL
Acid-Base Excess: 2.5 mmol/L — ABNORMAL HIGH (ref 0.0–2.0)
BICARBONATE: 26.5 mmol/L (ref 20.0–28.0)
Drawn by: 331471
O2 Content: 2 L/min
O2 SAT: 97.8 %
PCO2 ART: 40.8 mmHg (ref 32.0–48.0)
PO2 ART: 92.2 mmHg (ref 83.0–108.0)
Patient temperature: 98.6
pH, Arterial: 7.429 (ref 7.350–7.450)

## 2017-05-16 LAB — GLUCOSE, CAPILLARY
GLUCOSE-CAPILLARY: 188 mg/dL — AB (ref 65–99)
GLUCOSE-CAPILLARY: 152 mg/dL — AB (ref 65–99)
GLUCOSE-CAPILLARY: 137 mg/dL — AB (ref 65–99)
Glucose-Capillary: 159 mg/dL — ABNORMAL HIGH (ref 65–99)
Glucose-Capillary: 109 mg/dL — ABNORMAL HIGH (ref 65–99)
Glucose-Capillary: 129 mg/dL — ABNORMAL HIGH (ref 65–99)

## 2017-05-16 LAB — BASIC METABOLIC PANEL
ANION GAP: 7 (ref 5–15)
BUN: 23 mg/dL — AB (ref 6–20)
CHLORIDE: 110 mmol/L (ref 101–111)
CO2: 28 mmol/L (ref 22–32)
Calcium: 8.2 mg/dL — ABNORMAL LOW (ref 8.9–10.3)
Creatinine, Ser: 1 mg/dL (ref 0.44–1.00)
GFR calc Af Amer: >60 mL/min (ref >60)
GFR, EST NON AFRICAN AMERICAN: 55 mL/min — AB (ref >60)
GLUCOSE: 143 mg/dL — AB (ref 65–99)
Potassium: 3.8 mmol/L (ref 3.5–5.1)
Sodium: 145 mmol/L (ref 135–145)

## 2017-05-16 LAB — CBC
HEMATOCRIT: 23.2 % — AB (ref 36.0–46.0)
HEMOGLOBIN: 7.6 g/dL — AB (ref 12.0–15.0)
MCH: 31 pg (ref 26.0–34.0)
MCHC: 32.8 g/dL (ref 30.0–36.0)
MCV: 94.7 fL (ref 78.0–100.0)
Platelets: 52 10*3/uL — ABNORMAL LOW (ref 150–400)
RBC: 2.45 MIL/uL — ABNORMAL LOW (ref 3.87–5.11)
RDW: 21.8 % — AB (ref 11.5–15.5)
WBC: 2.7 10*3/uL — ABNORMAL LOW (ref 4.0–10.5)

## 2017-05-16 LAB — AMMONIA: Ammonia: 22 umol/L (ref 9–35)

## 2017-05-16 MED ORDER — LACTULOSE 10 GM/15ML PO SOLN
30.0000 g | Freq: Three times a day (TID) | ORAL | Status: DC
  Administered 2017-05-16 – 2017-05-17 (×4): 30 g via ORAL
  Filled 2017-05-16 (×5): qty 45

## 2017-05-16 MED ORDER — QUETIAPINE FUMARATE 100 MG PO TABS
100.0000 mg | ORAL_TABLET | Freq: Every day | ORAL | Status: DC
  Administered 2017-05-17: 100 mg via ORAL
  Filled 2017-05-16 (×2): qty 1

## 2017-05-16 NOTE — Telephone Encounter (Signed)
Thanks. Maybe we can reach out to her in a week and see if they have heard back. If she can't get into to see [facility] until January will have to discuss alternative plans

## 2017-05-16 NOTE — Progress Notes (Addendum)
PROGRESS NOTE  [name] TSV:[id number] DOB: [birth year] DOA: 05/15/2017 PCP: Garwin Brothers, MD   LOS: 0 days   Brief Narrative / Interim history: 76 y.o. 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  Patient's family who is at bedside (daughter and sister), tells me that she has been more sleepy and more confused than usual.  Patient also has a history of chronic urinary tract infections with ESBL and she is on Invanz intermittently, most recent completed a course looks like few days ago.  Assessment & Plan: Active Problems:   HYPERCHOLESTEROLEMIA   Iron deficiency anemia due to chronic blood loss   Essential hypertension   Aortic valve disorder   Angiodysplasia of intestine   Autoimmune hepatitis (HCC)   Fibromyalgia   Obesity, Class II, BMI 35-39.9, with comorbidity   GAVE (gastric antral vascular ectasia)   Other pancytopenia (HCC)   CKD stage 3 secondary to diabetes ([place])   DNR (do not resuscitate)   Liver cirrhosis secondary to NASH ([place])   COPD with chronic bronchitis (HCC)   Chronic hypoxemic respiratory failure (HCC)   Chronic diastolic CHF (congestive heart failure) (HCC)   Diabetes (HCC)   Symptomatic anemia   Acute hepatic encephalopathy   Symptomatic anemia, iron deficiency anemia due to chronic blood loss -In the setting of chronic GI losses with known GAVE.  Patient has been followed at [facility] and has had several sessions of radiofrequency ablation.  She is scheduled to have the next one in January, however per family they are working her to get the procedure done within the next  week. -Shunt was transfused overnight 2 units of packed red blood cells, her hemoglobin improved appropriately to 7.6.  No need for further transfusions at this point  Acute encephalopathy, hepatic -Likely a component of hepatic encephalopathy given presence of asterixis.   -Ammonia check last night was found to be elevated at 50.  Her mental status is worse today, which may suggest worsening of her hepatic encephalopathy and progression of her liver disease.  Check reversible causes, she has COPD we will check an ABG, check a TSH, increase lactulose dose today.  There is no evidence of infection, she does not have leukocytosis (she does have leukopenia), she is afebrile, and a urinalysis was essentially within normal limits and no evidence of a UTI -She is also on a lot of medications, will decrease to a dose of Seroquel as this can contribute  Goals of care -Extensive discussion with daughter at bedside today regarding patient's wishes.  She is DNR, and patient would not want very aggressive measures and life prolonging treatment.  Daughter is asking me about short and long-term prognosis, which I think is quite poor at this point.  Her encephalopathy has worsened since yesterday, and will try supportive measures as above and try to reverse what can be reversed.  She understands that if this is to progress may need to think about transitioning care towards comfort/hospice  Liver cirrhosis, with decompensation -Continue home medications, she is also thrombocytopenic and appears to have a degree of hepatic encephalopathy as well as GI bleed -Increase lactulose as  above, if she is unable to tolerate p.o. we will do PR  Thrombocytopenia -Due to liver cirrhosis, stable today  COPD with chronic hypoxic respiratory failure -Currently without wheezing, at baseline home oxygen, continue home medications and nebulizers as needed -Obtain ABG as above,  Type 2 diabetes mellitus -Resume home Lantus  at a lower dose, add sliding scale insulin -Fasting CBG 109, continue current regimen  Restless legs -Continue home Requip  Chronic kidney disease stage III -Creatinine appears at baseline, avoid nephrotoxins  Recurrent UTIs -Check a UA, not treat unless she is febrile or has significant symptoms consistent with a UTI stop has history of ESBL  Chronic diastolic CHF -She appears euvolemic. She tolerated hydration overnight as well as blood transfusion well, she has no evidence of fluid overload.  Since we are increasing the dose of lactulose, and expect more diarrhea, will continue to hold home diuretics and home potassium  Depression with psychotic features -Appears stable, continue home Seroquel, a lower dose as above  DVT prophylaxis: SCD Code Status: DNR Family Communication: d/w daughter at bedside Disposition Plan: SNF when ready   Consultants:   None   Procedures:   None   Antimicrobials:  None    Subjective: -She is sleeping, drowsy, difficult to arouse and answers a few basic questions but not much  Objective: Vitals:   05/16/17 0222 05/16/17 0430 05/16/17 0844 05/16/17 0850  BP: (!) 121/58 (!) 124/48    Pulse: (!) 51 (!) 56    Resp: 16 16    Temp: 98.6 F (37 C) 98.4 F (36.9 C)    TempSrc: Oral Oral    SpO2: 100% 98% 95% 95%  Weight:      Height:        Intake/Output Summary (Last 24 hours) at 05/16/17 1222 Last data filed at 05/16/17 0420  Gross per 24 hour  Intake           1501.5 ml  Output              250 ml  Net           1251.5 ml   Filed Weights   05/15/17 1546 05/15/17 2029  Weight: 72.6 kg (160 lb) 74.4 kg (164 lb)    Examination:  Constitutional: NAD, pale appearing Eyes: lids and conjunctivae normal ENMT: Mucous membranes are moist.  Respiratory: clear to auscultation bilaterally, no wheezing, no crackles.  Shallow respiratory effort Cardiovascular: Regular rate and rhythm, 3/6 SEM. No LE edema.  Abdomen: no  tenderness. Bowel sounds positive.  Neurologic: Intermittently follows commands, nonfocal   Data Reviewed: I have independently reviewed following labs and imaging studies   CBC:  Recent Labs Lab 05/15/17 1601 05/16/17 0627  WBC 1.9* 2.7*  NEUTROABS 1.4*  --   HGB 5.7* 7.6*  HCT 18.5* 23.2*  MCV 102.8* 94.7  PLT 55* 52*   Basic Metabolic Panel:  Recent Labs Lab 05/15/17 1601 05/16/17 0627  NA 141 145  K 4.0 3.8  CL 111 110  CO2 27 28  GLUCOSE 145* 143*  BUN 27* 23*  CREATININE 1.39* 1.00  CALCIUM 8.4* 8.2*   GFR: Estimated Creatinine Clearance: 49.5 mL/min (by C-G formula based on SCr of 1 mg/dL). Liver Function Tests:  Recent Labs Lab 05/15/17 1601  AST 43*  ALT 32  ALKPHOS 111  BILITOT 1.2  PROT 4.9*  ALBUMIN 2.7*   No results for input(s): LIPASE, AMYLASE in the last 168 hours.  Recent Labs Lab 05/15/17 1841  AMMONIA 53*   Coagulation Profile:  Recent Labs Lab 05/15/17 1601  INR 1.15   Cardiac Enzymes: No results for input(s): CKTOTAL, CKMB, CKMBINDEX, TROPONINI in the last 168 hours. BNP (last 3 results) No results for input(s): PROBNP in the last 8760 hours. HbA1C: No results for input(s): HGBA1C in the last 72 hours. CBG:  Recent Labs Lab 05/15/17 2241 05/15/17 2258 05/16/17 0353 05/16/17 0755 05/16/17 1200  GLUCAP 67 89 159* 109* 129*   Lipid Profile: No results for input(s): CHOL, HDL, LDLCALC, TRIG, CHOLHDL, LDLDIRECT in the last 72 hours. Thyroid Function Tests:  Recent Labs  05/16/17 1034  TSH 0.467   Anemia Panel: No results for input(s): VITAMINB12, FOLATE, FERRITIN, TIBC, IRON, RETICCTPCT in the last 72 hours. Urine analysis:    Component Value Date/Time   COLORURINE YELLOW 05/15/2017 2137   APPEARANCEUR CLEAR 05/15/2017 2137   LABSPEC 1.008 05/15/2017 2137   PHURINE 5.0 05/15/2017 2137   GLUCOSEU NEGATIVE 05/15/2017 2137   GLUCOSEU NEGATIVE 07/17/2015 0907   HGBUR NEGATIVE 05/15/2017 2137   BILIRUBINUR  NEGATIVE 05/15/2017 2137   BILIRUBINUR negative 02/26/2016 1206   [place] NEGATIVE 05/15/2017 2137   PROTEINUR NEGATIVE 05/15/2017 2137   UROBILINOGEN 4.0 02/26/2016 1206   UROBILINOGEN 0.2 07/17/2015 0907   NITRITE NEGATIVE 05/15/2017 2137   LEUKOCYTESUR NEGATIVE 05/15/2017 2137   Sepsis Labs: Invalid input(s): PROCALCITONIN, LACTICIDVEN  No results found for this or any previous visit (from the past 240 hour(s)).    Radiology Studies: No results found.   Scheduled Meds: . atorvastatin  20 mg Oral q1800  . buPROPion  150 mg Oral Daily  . dicyclomine  20 mg Oral BID  . ferrous gluconate  324 mg Oral TID WC  . guaiFENesin  600 mg Oral BID  . insulin aspart  0-9 Units Subcutaneous TID WC  . insulin glargine  45 Units Subcutaneous q morning - 10a  . isosorbide mononitrate  15 mg Oral Daily  . lactulose  30 g Oral TID  . levalbuterol  0.63 mg Nebulization BID  . loratadine  10 mg Oral Daily  . mometasone-formoterol  2 puff Inhalation BID  . pantoprazole  40 mg Oral BID  . polyethylene glycol  17 g Oral Daily  . potassium chloride SA  40 mEq Oral Daily  . QUEtiapine  100 mg Oral QHS  . rifaximin  550 mg Oral BID  . rOPINIRole  4 mg Oral QHS  . saccharomyces boulardii  250 mg Oral BID  . torsemide  40 mg Oral Daily  . traMADol  50 mg Oral BID  . venlafaxine XR  75 mg Oral Q breakfast   Continuous Infusions: . sodium chloride      Time spent: 35 minutes, more than 50% involved in GOC/disease progression/patient trajectory course  Marzetta Board, MD, PhD [facility] Pager [phone]  If 7PM-7AM, please contact night-coverage [email or web address] Password [facility] 05/16/2017, 12:22 PM
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
## 2017-05-16 NOTE — Care Management Note (Signed)
Case Management Note  Patient Details  Name: ALAJA GOLDINGER MRN: [id number] Date of Birth: 10/07/1940  Subjective/Objective:                  In the emergency room her vital signs are stable, she is afebrile and heart rate is on the bradycardic side.  Blood work reveals a creatinine of 1.39, hemoglobin of 5.7, and platelets of 55.  She was ordered 2 units of packed red blood cells, and TRH was asked for admission   Action/Plan: May 15, 2017 Velva Harman, BSN, [place],  [place] Chart reviewed for case management and discharge needs. Next review date: [id number]  Expected Discharge Date:   (unknown)               Expected Discharge Plan:  [place]  In-House Referral:  Clinical Social Work  Discharge planning Services  CM Consult  Post Acute Care Choice:    Choice offered to:     DME Arranged:    DME Agency:     HH Arranged:    [place] Agency:     Status of Service:  In process, will continue to follow  If discussed at Long Length of Stay Meetings, dates discussed:    Additional Comments:  Leeroy Cha, RN 05/16/2017, 8:37 AM

## 2017-05-16 NOTE — Progress Notes (Signed)
CSW following as pt is admitted from facility. Per previous CSW assessment pt/family plans to have pt return to [facility] SNF at DC. Has resided there for long term care for past 14 months. Discussed with attending. Pt not stable for DC today. Completed FL2 and selected facility in [place]- left message with facility to update them.  Will follow and assist with transition back to her SNF at pt's DC.  Sharren Bridge, MSW, LCSW Clinical Social Work 05/16/2017 [phone]

## 2017-05-17 DIAGNOSIS — K7200 Acute and subacute hepatic failure without coma: Secondary | ICD-10-CM | POA: Diagnosis not present

## 2017-05-17 DIAGNOSIS — K922 Gastrointestinal hemorrhage, unspecified: Secondary | ICD-10-CM

## 2017-05-17 DIAGNOSIS — K5520 Angiodysplasia of colon without hemorrhage: Secondary | ICD-10-CM | POA: Diagnosis not present

## 2017-05-17 DIAGNOSIS — D649 Anemia, unspecified: Secondary | ICD-10-CM

## 2017-05-17 LAB — COMPREHENSIVE METABOLIC PANEL
ALBUMIN: 2.6 g/dL — AB (ref 3.5–5.0)
ALK PHOS: 111 U/L (ref 38–126)
ALT: 43 U/L (ref 14–54)
AST: 55 U/L — AB (ref 15–41)
Anion gap: 8 (ref 5–15)
BILIRUBIN TOTAL: 1.1 mg/dL (ref 0.3–1.2)
BUN: 19 mg/dL (ref 6–20)
CALCIUM: 8.1 mg/dL — AB (ref 8.9–10.3)
CO2: 28 mmol/L (ref 22–32)
Chloride: 108 mmol/L (ref 101–111)
Creatinine, Ser: 1.16 mg/dL — ABNORMAL HIGH (ref 0.44–1.00)
GFR calc Af Amer: 53 mL/min — ABNORMAL LOW (ref >60)
GFR calc non Af Amer: 46 mL/min — ABNORMAL LOW (ref >60)
GLUCOSE: 144 mg/dL — AB (ref 65–99)
Potassium: 3.5 mmol/L (ref 3.5–5.1)
Sodium: 144 mmol/L (ref 135–145)
TOTAL PROTEIN: 4.6 g/dL — AB (ref 6.5–8.1)

## 2017-05-17 LAB — CBC
HCT: 23.4 % — ABNORMAL LOW (ref 36.0–46.0)
Hemoglobin: 7.5 g/dL — ABNORMAL LOW (ref 12.0–15.0)
MCH: 31.1 pg (ref 26.0–34.0)
MCHC: 32.1 g/dL (ref 30.0–36.0)
MCV: 97.1 fL (ref 78.0–100.0)
Platelets: 54 10*3/uL — ABNORMAL LOW (ref 150–400)
RBC: 2.41 MIL/uL — ABNORMAL LOW (ref 3.87–5.11)
RDW: 22.8 % — AB (ref 11.5–15.5)
WBC: 3.2 10*3/uL — ABNORMAL LOW (ref 4.0–10.5)

## 2017-05-17 LAB — GLUCOSE, CAPILLARY
GLUCOSE-CAPILLARY: 116 mg/dL — AB (ref 65–99)
Glucose-Capillary: 141 mg/dL — ABNORMAL HIGH (ref 65–99)

## 2017-05-17 MED ORDER — ALPRAZOLAM 1 MG PO TABS: ORAL_TABLET | ORAL | 0 refills | Status: DC

## 2017-05-17 MED ORDER — QUETIAPINE FUMARATE 200 MG PO TABS: 100.0000 mg | ORAL_TABLET | Freq: Every day | ORAL | 3 refills | Status: DC

## 2017-05-17 MED ORDER — OXYCODONE HCL 5 MG PO TABS
5.0000 mg | ORAL_TABLET | Freq: Four times a day (QID) | ORAL | 0 refills | Status: DC | PRN
Start: 2017-05-17 — End: 2017-05-31

## 2017-05-17 NOTE — Progress Notes (Signed)
Patient discharged to SNF, copies of all discharge medications and instructions sent to facility.  Report called to receiving nurse. Pt to be transported via [facility].

## 2017-05-17 NOTE — Discharge Summary (Signed)
Physician Discharge Summary  [name] EZM:[id number] DOB: Oct 28, 1940 DOA: 05/15/2017  PCP: [name] Brothers, MD  Admit date: 05/15/2017 Discharge date: 05/17/2017  Admitted From: SNF Disposition:  SNF  Recommendations for Outpatient Follow-up:  1. Follow up with [facility] as scheduled 2. Repeat CBC in 3-5 days 3. Please ensure patient has 2 BMs every day while on Lactulose.  4. Recommend palliative follow up at [place]: none Equipment/Devices: none  Discharge Condition: stable CODE STATUS: DNR Diet recommendation: regular  TKP:TWSFKC [name] is [name] 76 y.o. 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  Patient's family who is at bedside (daughter and sister), tells me that she has been more sleepy and more confused than usual.  Patient also has [name] history of chronic urinary tract infections with ESBL and she is on Invanz intermittently, most recent completed [name] course looks like few days ago.  Patient currently complains of weakness, sleepiness.  She denies any chest pain.  She complains of shortness of breath, however states that this is chronic.  She has no abdominal pain, no nausea or vomiting.  She has no diarrhea.  Patient and family deny any evidence of bright red blood in her stool or melena.  Patient is able to answer basic orientation questions but thinks the year is 20/20.  ED Course: In the emergency room her vital signs are stable, she is afebrile and heart rate is on the bradycardic side.  Blood work reveals [name] creatinine of 1.39, hemoglobin of 5.7, and platelets of 55.  She was ordered 2 units of packed red blood  cells, and TRH was asked for admission  Hospital Course: Discharge Diagnoses:  Active Problems:   HYPERCHOLESTEROLEMIA   Iron deficiency anemia due to chronic blood loss   Essential hypertension   Aortic valve disorder   Angiodysplasia of intestine   Autoimmune hepatitis (HCC)   Fibromyalgia   Obesity, Class II, BMI 35-39.9, with comorbidity   GAVE (gastric antral vascular ectasia)   Other pancytopenia (HCC)   CKD stage 3 secondary to diabetes ([place])   DNR (do not resuscitate)   Liver cirrhosis secondary to NASH ([place])   COPD with chronic bronchitis (HCC)   Chronic hypoxemic respiratory failure (HCC)   Chronic diastolic CHF (congestive heart failure) (HCC)   Diabetes (HCC)   Symptomatic anemia   Acute hepatic encephalopathy    Symptomatic anemia, iron deficiency anemia due to chronic blood loss -In the setting of chronic GI losses with known GAVE. Patient has been followed at [facility] and has had several sessions of radiofrequency ablation. She is scheduled to have the next one in January, however per family they are working her to get the procedure done within the next week. -She was transfused 2 units of packed red blood cells, her hemoglobin improved appropriately to 7.6 and repeat is stable at 7.5.  No need for further transfusions at this point. No evidence of gross GI bleeding -recommend repeat [name] CBC in 3-5 days Acute encephalopathy, hepatic -Likely [name] component of hepatic encephalopathy given presence of asterixis.  -Ammonia check on admission was found to be elevated at 50.  She was given higher dose of lactulose and eventually patient cleared and is now back to baseline. It was felt like her home medications may contribute to her morning somnolence and her seroquel dose was decreased which improvement in her morning lethargy. ABG was reassuring she is not retaining. TSH was normal.  There was no evidence of infection, she does not have leukocytosis, she is  afebrile, and [name] urinalysis was essentially within normal limits and no evidence of [name] UTI Liver cirrhosis, with decompensation -Continue home medications, she is also thrombocytopenic and appears to have [name] degree of hepatic encephalopathy as well as GI bleed -Increase lactulose as above, if she is unable to tolerate p.o. we will do PR Thrombocytopenia -Due to liver cirrhosis, stable COPD with chronic hypoxic respiratory failure -Currently without wheezing, at baseline home oxygen, continue home medications and nebulizers as needed -ABG reassuring  Type 2 diabetes mellitus -resume home regimen Restless legs -Continue home Requip Chronic kidney disease stage III -Creatinine appears at baseline, avoid nephrotoxins Recurrent UTIs -[place] negative here  Chronic diastolic CHF -She appears euvolemic. Resume home meds Depression with psychotic features -Appears stable, continue home Seroquel, [name] lower dose as above   Discharge Instructions   Allergies as of 05/17/2017      Reactions   Acetaminophen Other (See Comments)   Liver dysfunction   Nsaids Other (See Comments)   Liver dysfunction    Aspirin Other (See Comments)   Causes nosebleeds   Theophylline Nausea And Vomiting   Penicillin G Itching   Penicillins Itching   Has patient had [name] PCN reaction causing immediate rash, facial/tongue/throat swelling, SOB or lightheadedness with hypotension: unknown Has patient had [name] PCN reaction causing severe rash involving mucus membranes or skin necrosis: unknown Has patient had [name] PCN reaction that required hospitalization unknown Has patient had [name] PCN reaction occurring within the last 10 years: yes If all of the above answers are "NO", then may proceed with Cephalosporin use.      Medication List    STOP taking these medications   diphenhydrAMINE 25 MG tablet Commonly known as:  BENADRYL     TAKE these medications   ADVAIR DISKUS 250-50 MCG/DOSE Aepb Generic drug:   Fluticasone-Salmeterol INHALE 1 PUFF INTO LUNGS TWICE [name] DAY   ALPRAZolam 1 MG tablet Commonly known as:  XANAX TAKE 1/2 TO 1 TABLET TWICE [name] DAY AS NEEDED FOR ANXIETY AND SLEEP What changed:  See the new instructions.   atorvastatin 20 MG tablet Commonly known as:  LIPITOR Take 20 mg by mouth daily at 6 PM.   buPROPion 150 MG 24 hr tablet Commonly known as:  WELLBUTRIN XL Take 150 mg by mouth daily.   [employer] B COMPLEX PLUS C Tabs TAKE ONE CAPSULE BY MOUTH EVERY DAY   dicyclomine 20 MG tablet Commonly known as:  BENTYL Take 1 tablet (20 mg total) by mouth 2 (two) times daily.   ertapenem 1 g injection Commonly known as:  INVANZ Inject 1 g into the muscle at bedtime.   ferrous gluconate 324 MG tablet Commonly known as:  FERGON Take 324 mg by mouth 3 (three) times daily with meals.   fexofenadine-pseudoephedrine 180-240 MG 24 hr tablet Commonly known as:  ALLEGRA-D 24 Take 1 tablet by mouth daily.   fluconazole 150 MG tablet Commonly known as:  DIFLUCAN Take 150 mg by mouth as needed.   glucose blood test strip Commonly known as:  ONE TOUCH ULTRA TEST Check blood sugar 4 times daily  HUMALOG KWIKPEN 100 UNIT/ML KiwkPen Generic drug:  insulin lispro Inject 0-10 Units into the skin 4 (four) times daily -  before meals and at bedtime. 0-150=0 151-200=2 201-250=4 251-300=6 301-350=8 351-400=10   Insulin Pen Needle 31G X 8 MM Misc Commonly known as:  B-D ULTRAFINE III SHORT PEN Use as directed to inject insulin twice daily. Dx: E11.8   ipratropium-albuterol 0.5-2.5 (3) MG/3ML Soln Commonly known as:  DUONEB Take 3 mLs by nebulization 2 (two) times daily.   isosorbide mononitrate 30 MG 24 hr tablet Commonly known as:  IMDUR Take 1 tablet (30 mg total) by mouth daily.   Lactulose 20 GM/30ML Soln Take 30 mLs (20 g total) by mouth 2 (two) times daily. But skip one dose if diarrhea What changed:  when to take this  reasons to take this  additional instructions     LANTUS SOLOSTAR 100 UNIT/ML Solostar Pen Generic drug:  Insulin Glargine Inject 60 Units into the skin every morning.   levalbuterol 0.63 MG/3ML nebulizer solution Commonly known as:  XOPENEX Take 0.63 mg by nebulization 2 (two) times daily.   metolazone 2.5 MG tablet Commonly known as:  ZAROXOLYN daily. TAKE 1 TABLET BY MOUTH  FOR 4 LB WEIGHT GAIN IN 24 HOURS   multivitamin with minerals Tabs tablet Take 1 tablet by mouth daily.   nitroGLYCERIN 0.4 MG SL tablet Commonly known as:  NITROSTAT Place 1 tablet (0.4 mg total) under the tongue every 5 (five) minutes as needed for chest pain. No more than 3 in 15 minutes   nystatin 100000 UNIT/ML suspension Commonly known as:  MYCOSTATIN Take 5 mLs (500,000 Units total) by mouth 4 (four) times daily. X 7DAYS   ONETOUCH DELICA LANCETS 40N Misc USE TO CHECK SUGAR 4 TIMES DAILY AND AS NEEDED. UU:V25.36 **ONE TOUCH DELICA**   oxyCODONE 5 MG immediate release tablet Commonly known as:  Oxy IR/ROXICODONE Take 1 tablet (5 mg total) by mouth every 6 (six) hours as needed for moderate pain or severe pain.   OXYGEN Inhale 2 L into the lungs continuous.   pantoprazole 40 MG tablet Commonly known as:  PROTONIX TAKE 1 TABLET BY MOUTH TWICE [name] DAY What changed:  See the new instructions.   polyethylene glycol packet Commonly known as:  MIRALAX Take 17 g by mouth daily.   potassium chloride SA 20 MEQ tablet Commonly known as:  K-DUR,KLOR-CON Take 40 mEq by mouth daily.   PROAIR HFA 108 (90 Base) MCG/ACT inhaler Generic drug:  albuterol Inhale 2 puffs into the lungs every 6 (six) hours as needed for wheezing or shortness of breath. Reported on 08/18/2015   promethazine 25 MG tablet Commonly known as:  PHENERGAN Take 25 mg by mouth 2 (two) times daily as needed for nausea or vomiting.   QUEtiapine 200 MG tablet Commonly known as:  SEROQUEL Take 0.5 tablets (100 mg total) by mouth at bedtime. What changed:  See the new  instructions.  Another medication with the same name was removed. Continue taking this medication, and follow the directions you see here.   rifaximin 550 MG Tabs tablet Commonly known as:  XIFAXAN Take 550 mg by mouth 2 (two) times daily.   rOPINIRole 4 MG tablet Commonly known as:  REQUIP TAKE 1 TABLET BY MOUTH AT BEDTIME   saccharomyces boulardii 250 MG capsule Commonly known as:  FLORASTOR Take 250 mg by mouth 2 (two) times daily.   SYSTANE BALANCE 0.6 % Soln Generic drug:  Propylene Glycol Place 1 drop  into both eyes every morning.   torsemide 20 MG tablet Commonly known as:  DEMADEX Take 40 mg by mouth daily.   traMADol 50 MG tablet Commonly known as:  ULTRAM Take 50 mg by mouth 2 (two) times daily. Not to be given with Xanax.   triamcinolone cream 0.1 % Commonly known as:  KENALOG Apply 1 application topically 2 (two) times daily. To left area under stomach for yeast.   venlafaxine XR 75 MG 24 hr capsule Commonly known as:  EFFEXOR-XR Take 75 mg by mouth daily with breakfast.   Vitamin D (Ergocalciferol) 50000 units Caps capsule Commonly known as:  DRISDOL Take 50,000 Units by mouth every Friday.            Discharge Care Instructions        Start     Ordered   05/17/17 0000  ALPRAZolam (XANAX) 1 MG tablet    Comments:  Not to exceed 5 additional fills before 07/27/2016   05/17/17 1020   05/17/17 0000  oxyCODONE (OXY IR/ROXICODONE) 5 MG immediate release tablet  Every 6 hours PRN     05/17/17 1020   05/17/17 0000  QUEtiapine (SEROQUEL) 200 MG tablet  Daily at bedtime     05/17/17 1020     Contact information for after-discharge care    [facility] .   Specialty:  Skilled Nursing Facility Contact information: [address] [address] [phone]             Allergies  Allergen Reactions  . Acetaminophen Other (See Comments)    Liver dysfunction  . Nsaids Other (See Comments)     Liver dysfunction   . Aspirin Other (See Comments)    Causes nosebleeds  . Theophylline Nausea And Vomiting  . Penicillin G Itching  . Penicillins Itching    Has patient had [name] PCN reaction causing immediate rash, facial/tongue/throat swelling, SOB or lightheadedness with hypotension: unknown Has patient had [name] PCN reaction causing severe rash involving mucus membranes or skin necrosis: unknown Has patient had [name] PCN reaction that required hospitalization unknown Has patient had [name] PCN reaction occurring within the last 10 years: yes If all of the above answers are "NO", then may proceed with Cephalosporin use.     Consultations:  None   Procedures/Studies: None  Xr Foot 2 Views Left  Result Date: 05/08/2017 Radiographs of the left foot show interval callus formation at 5th MT distal shaft fracture site.     Subjective: - no chest pain, shortness of breath, no abdominal pain, nausea or vomiting.   Discharge Exam: Vitals:   05/16/17 2021 05/17/17 0431  BP: (!) 135/56 (!) 138/59  Pulse: (!) 56 62  Resp: 18 18  Temp: 98.5 F (36.9 C) 98.6 F (37 C)  SpO2: 97% 99%    General: Pt is alert, awake, not in acute distress Cardiovascular: RRR, S1/S2 +, no rubs, no gallops Respiratory: CTA bilaterally, no wheezing, no rhonchi Abdominal: Soft, NT, ND, bowel sounds + Extremities: no edema, no cyanosis    The results of significant diagnostics from this hospitalization (including imaging, microbiology, ancillary and laboratory) are listed below for reference.     Microbiology: No results found for this or any previous visit (from the past 240 hour(s)).   Labs: BNP (last 3 results) No results for input(s): BNP in the last 8760 hours. Basic Metabolic Panel:  Recent Labs Lab 05/15/17 1601 05/16/17 0627 05/17/17 0320  NA 141  145 144  K 4.0 3.8 3.5  CL 111 110 108  CO2 27 28 28   GLUCOSE 145* 143* 144*  BUN 27* 23* 19  CREATININE 1.39* 1.00 1.16*  CALCIUM 8.4* 8.2*  8.1*   Liver Function Tests:  Recent Labs Lab 05/15/17 1601 05/17/17 0320  AST 43* 55*  ALT 32 43  ALKPHOS 111 111  BILITOT 1.2 1.1  PROT 4.9* 4.6*  ALBUMIN 2.7* 2.6*   No results for input(s): LIPASE, AMYLASE in the last 168 hours.  Recent Labs Lab 05/15/17 1841 05/16/17 1214  AMMONIA 53* 22   CBC:  Recent Labs Lab 05/15/17 1601 05/16/17 0627 05/17/17 0320  WBC 1.9* 2.7* 3.2*  NEUTROABS 1.4*  --   --   HGB 5.7* 7.6* 7.5*  HCT 18.5* 23.2* 23.4*  MCV 102.8* 94.7 97.1  PLT 55* 52* 54*   Cardiac Enzymes: No results for input(s): CKTOTAL, CKMB, CKMBINDEX, TROPONINI in the last 168 hours. BNP: Invalid input(s): POCBNP CBG:  Recent Labs Lab 05/16/17 1200 05/16/17 1753 05/16/17 1937 05/16/17 2133 05/17/17 0720  GLUCAP 129* 188* 152* 137* 116*   D-Dimer No results for input(s): DDIMER in the last 72 hours. Hgb A1c No results for input(s): HGBA1C in the last 72 hours. Lipid Profile No results for input(s): CHOL, HDL, LDLCALC, TRIG, CHOLHDL, LDLDIRECT in the last 72 hours. Thyroid function studies  Recent Labs  05/16/17 1034  TSH 0.467   Anemia work up No results for input(s): VITAMINB12, FOLATE, FERRITIN, TIBC, IRON, RETICCTPCT in the last 72 hours. Urinalysis    Component Value Date/Time   COLORURINE YELLOW 05/15/2017 2137   APPEARANCEUR CLEAR 05/15/2017 2137   LABSPEC 1.008 05/15/2017 2137   PHURINE 5.0 05/15/2017 2137   GLUCOSEU NEGATIVE 05/15/2017 2137   GLUCOSEU NEGATIVE 07/17/2015 0907   HGBUR NEGATIVE 05/15/2017 2137   BILIRUBINUR NEGATIVE 05/15/2017 2137   BILIRUBINUR negative 02/26/2016 1206   KETONESUR NEGATIVE 05/15/2017 2137   PROTEINUR NEGATIVE 05/15/2017 2137   UROBILINOGEN 4.0 02/26/2016 1206   UROBILINOGEN 0.2 07/17/2015 0907   NITRITE NEGATIVE 05/15/2017 2137   LEUKOCYTESUR NEGATIVE 05/15/2017 2137   Sepsis Labs Invalid input(s): PROCALCITONIN,  WBC,  LACTICIDVEN   Time coordinating discharge: 45  minutes  SIGNED:  Marzetta Board, MD  [facility] 05/17/2017, 10:21 AM Pager [phone]  If 7PM-7AM, please contact night-coverage [email or web address] Password [email or web address]

## 2017-05-17 NOTE — Progress Notes (Signed)
Patient will discharge to [facility] Anticipated discharge date: 9/22 Family notified: at bedside Transportation by [facility]- scheduled for 12:45pm  CSW signing off.  Jorge Ny MSW, LCSW [facility] #: [phone]

## 2017-05-19 LAB — BPAM RBC
Blood Product Expiration Date: 201810092359
Blood Product Expiration Date: 201810092359
ISSUE DATE / TIME: 201809210138
ISSUE DATE / TIME: 201809202249
UNIT TYPE AND RH: 5100
Unit Type and Rh: 5100

## 2017-05-19 LAB — TYPE AND SCREEN
ABO/RH(D): O POS
ANTIBODY SCREEN: NEGATIVE
DONOR AG TYPE: NEGATIVE
Donor AG Type: NEGATIVE
Unit division: 0
Unit division: 0

## 2017-05-20 NOTE — Telephone Encounter (Signed)
Left message for patient's daughter, [name]. Asked her to contact our office to let us know if Dr. Serena Croissant office was able to get her scheduled sooner that January. Patient did end up going to [facility] for blood transfusion last week.

## 2017-05-21 NOTE — Telephone Encounter (Signed)
Okay. If they have not heard back in a few more days let me know. We may have to look into getting her seen elsewhere. Thanks

## 2017-05-21 NOTE — Telephone Encounter (Signed)
Spoke to patient's daughter, [name], they have not heard back from Dr. Serena Croissant office. They were told it could take 5-7 days to hear back from them.

## 2017-05-30 ENCOUNTER — Encounter (HOSPITAL_COMMUNITY): Payer: Self-pay | Admitting: Nurse Practitioner

## 2017-05-30 ENCOUNTER — Observation Stay (HOSPITAL_COMMUNITY)
Admission: EM | Admit: 2017-05-30 | Discharge: 2017-05-31 | Disposition: A | Discharge status: Home / Self Care | Attending: Internal Medicine | Admitting: Internal Medicine

## 2017-05-30 ENCOUNTER — Emergency Department (HOSPITAL_COMMUNITY)

## 2017-05-30 DIAGNOSIS — N183 Chronic kidney disease, stage 3 (moderate): Secondary | ICD-10-CM | POA: Diagnosis not present

## 2017-05-30 DIAGNOSIS — D649 Anemia, unspecified: Principal | ICD-10-CM | POA: Insufficient documentation

## 2017-05-30 DIAGNOSIS — J45909 Unspecified asthma, uncomplicated: Secondary | ICD-10-CM | POA: Diagnosis not present

## 2017-05-30 DIAGNOSIS — Z79899 Other long term (current) drug therapy: Secondary | ICD-10-CM | POA: Insufficient documentation

## 2017-05-30 DIAGNOSIS — E1122 Type 2 diabetes mellitus with diabetic chronic kidney disease: Secondary | ICD-10-CM | POA: Insufficient documentation

## 2017-05-30 DIAGNOSIS — K31819 Angiodysplasia of stomach and duodenum without bleeding: Secondary | ICD-10-CM | POA: Diagnosis not present

## 2017-05-30 DIAGNOSIS — Z880 Allergy status to penicillin: Secondary | ICD-10-CM | POA: Diagnosis not present

## 2017-05-30 DIAGNOSIS — Z515 Encounter for palliative care: Secondary | ICD-10-CM | POA: Diagnosis not present

## 2017-05-30 DIAGNOSIS — D500 Iron deficiency anemia secondary to blood loss (chronic): Secondary | ICD-10-CM | POA: Diagnosis present

## 2017-05-30 DIAGNOSIS — Z9981 Dependence on supplemental oxygen: Secondary | ICD-10-CM | POA: Diagnosis not present

## 2017-05-30 DIAGNOSIS — K7460 Unspecified cirrhosis of liver: Secondary | ICD-10-CM | POA: Diagnosis present

## 2017-05-30 DIAGNOSIS — I130 Hypertensive heart and chronic kidney disease with heart failure and stage 1 through stage 4 chronic kidney disease, or unspecified chronic kidney disease: Secondary | ICD-10-CM | POA: Diagnosis not present

## 2017-05-30 DIAGNOSIS — R300 Dysuria: Secondary | ICD-10-CM | POA: Insufficient documentation

## 2017-05-30 DIAGNOSIS — K7469 Other cirrhosis of liver: Secondary | ICD-10-CM | POA: Diagnosis not present

## 2017-05-30 DIAGNOSIS — Z87891 Personal history of nicotine dependence: Secondary | ICD-10-CM | POA: Insufficient documentation

## 2017-05-30 DIAGNOSIS — Z794 Long term (current) use of insulin: Secondary | ICD-10-CM | POA: Diagnosis not present

## 2017-05-30 DIAGNOSIS — E119 Type 2 diabetes mellitus without complications: Secondary | ICD-10-CM

## 2017-05-30 DIAGNOSIS — I5032 Chronic diastolic (congestive) heart failure: Secondary | ICD-10-CM | POA: Diagnosis not present

## 2017-05-30 DIAGNOSIS — J449 Chronic obstructive pulmonary disease, unspecified: Secondary | ICD-10-CM | POA: Insufficient documentation

## 2017-05-30 LAB — URINALYSIS, ROUTINE W REFLEX MICROSCOPIC
BACTERIA UA: NONE SEEN
Bilirubin Urine: NEGATIVE
GLUCOSE, UA: NEGATIVE mg/dL
Hgb urine dipstick: NEGATIVE
KETONES UR: NEGATIVE mg/dL
Nitrite: NEGATIVE
PROTEIN: NEGATIVE mg/dL
RBC / HPF: NONE SEEN RBC/hpf (ref 0–5)
SQUAMOUS EPITHELIAL / LPF: NONE SEEN
Specific Gravity, Urine: 1.01 (ref 1.005–1.030)
pH: 5 (ref 5.0–8.0)

## 2017-05-30 LAB — COMPREHENSIVE METABOLIC PANEL
ALK PHOS: 138 U/L — AB (ref 38–126)
ALT: 49 U/L (ref 14–54)
ANION GAP: 7 (ref 5–15)
AST: 54 U/L — ABNORMAL HIGH (ref 15–41)
Albumin: 2.8 g/dL — ABNORMAL LOW (ref 3.5–5.0)
BUN: 24 mg/dL — ABNORMAL HIGH (ref 6–20)
CALCIUM: 8.3 mg/dL — AB (ref 8.9–10.3)
CHLORIDE: 106 mmol/L (ref 101–111)
CO2: 28 mmol/L (ref 22–32)
CREATININE: 1.1 mg/dL — AB (ref 0.44–1.00)
GFR, EST AFRICAN AMERICAN: 56 mL/min — AB (ref >60)
GFR, EST NON AFRICAN AMERICAN: 49 mL/min — AB (ref >60)
Glucose, Bld: 180 mg/dL — ABNORMAL HIGH (ref 65–99)
Potassium: 3.4 mmol/L — ABNORMAL LOW (ref 3.5–5.1)
SODIUM: 141 mmol/L (ref 135–145)
Total Bilirubin: 1 mg/dL (ref 0.3–1.2)
Total Protein: 4.8 g/dL — ABNORMAL LOW (ref 6.5–8.1)

## 2017-05-30 LAB — CBC
HCT: 21.1 % — ABNORMAL LOW (ref 36.0–46.0)
Hemoglobin: 6.6 g/dL — CL (ref 12.0–15.0)
MCH: 32 pg (ref 26.0–34.0)
MCHC: 31.3 g/dL (ref 30.0–36.0)
MCV: 102.4 fL — AB (ref 78.0–100.0)
PLATELETS: 80 10*3/uL — AB (ref 150–400)
RBC: 2.06 MIL/uL — AB (ref 3.87–5.11)
RDW: 20.1 % — ABNORMAL HIGH (ref 11.5–15.5)
WBC: 3 10*3/uL — AB (ref 4.0–10.5)

## 2017-05-30 LAB — PREPARE RBC (CROSSMATCH)

## 2017-05-30 MED ORDER — FLUTICASONE FUROATE-VILANTEROL 200-25 MCG/INH IN AEPB
1.0000 | INHALATION_SPRAY | Freq: Every day | RESPIRATORY_TRACT | Status: DC
  Administered 2017-05-31: 08:00:00 1 via RESPIRATORY_TRACT
  Filled 2017-05-30: qty 28

## 2017-05-30 MED ORDER — POLYETHYLENE GLYCOL 3350 17 G PO PACK
17.0000 g | PACK | Freq: Every day | ORAL | Status: DC
  Administered 2017-05-31: 17 g via ORAL
  Filled 2017-05-30: qty 1

## 2017-05-30 MED ORDER — PROMETHAZINE HCL 25 MG PO TABS
25.0000 mg | ORAL_TABLET | Freq: Two times a day (BID) | ORAL | Status: DC | PRN
  Administered 2017-05-31: 25 mg via ORAL
  Filled 2017-05-30: qty 1

## 2017-05-30 MED ORDER — POLYVINYL ALCOHOL 1.4 % OP SOLN
1.0000 [drp] | Freq: Every morning | OPHTHALMIC | Status: DC
  Filled 2017-05-30: qty 15

## 2017-05-30 MED ORDER — SODIUM CHLORIDE 0.9 % IV SOLN
Freq: Once | INTRAVENOUS | Status: AC
  Administered 2017-05-30: 10 mL/h via INTRAVENOUS

## 2017-05-30 MED ORDER — TRAMADOL HCL 50 MG PO TABS
50.0000 mg | ORAL_TABLET | Freq: Two times a day (BID) | ORAL | Status: DC
  Administered 2017-05-30 – 2017-05-31 (×2): 50 mg via ORAL
  Filled 2017-05-30 (×2): qty 1

## 2017-05-30 MED ORDER — IPRATROPIUM-ALBUTEROL 0.5-2.5 (3) MG/3ML IN SOLN
3.0000 mL | Freq: Two times a day (BID) | RESPIRATORY_TRACT | Status: DC
  Administered 2017-05-31: 3 mL via RESPIRATORY_TRACT
  Filled 2017-05-30: qty 3

## 2017-05-30 MED ORDER — BUPROPION HCL ER (XL) 150 MG PO TB24
150.0000 mg | ORAL_TABLET | Freq: Every day | ORAL | Status: DC
  Administered 2017-05-31: 150 mg via ORAL
  Filled 2017-05-30: qty 1

## 2017-05-30 MED ORDER — ATORVASTATIN CALCIUM 20 MG PO TABS: 20.0000 mg | ORAL_TABLET | Freq: Every day | ORAL | Status: DC

## 2017-05-30 MED ORDER — ONDANSETRON HCL 4 MG PO TABS: 4.0000 mg | ORAL_TABLET | Freq: Four times a day (QID) | ORAL | Status: DC | PRN

## 2017-05-30 MED ORDER — ALBUTEROL SULFATE (2.5 MG/3ML) 0.083% IN NEBU: 3.0000 mL | INHALATION_SOLUTION | Freq: Four times a day (QID) | RESPIRATORY_TRACT | Status: DC | PRN

## 2017-05-30 MED ORDER — INSULIN GLARGINE 100 UNIT/ML ~~LOC~~ SOLN
30.0000 [IU] | Freq: Every day | SUBCUTANEOUS | Status: DC
  Administered 2017-05-31: 30 [IU] via SUBCUTANEOUS
  Filled 2017-05-30: qty 0.3

## 2017-05-30 MED ORDER — ROPINIROLE HCL 1 MG PO TABS
4.0000 mg | ORAL_TABLET | Freq: Every day | ORAL | Status: DC
  Administered 2017-05-30: 4 mg via ORAL
  Filled 2017-05-30: qty 4

## 2017-05-30 MED ORDER — VENLAFAXINE HCL 75 MG PO TABS
75.0000 mg | ORAL_TABLET | Freq: Every day | ORAL | Status: DC
  Administered 2017-05-31: 75 mg via ORAL
  Filled 2017-05-30: qty 1

## 2017-05-30 MED ORDER — INSULIN ASPART 100 UNIT/ML ~~LOC~~ SOLN
0.0000 [IU] | Freq: Three times a day (TID) | SUBCUTANEOUS | Status: DC
  Administered 2017-05-31: 3 [IU] via SUBCUTANEOUS

## 2017-05-30 MED ORDER — INSULIN GLARGINE 100 UNIT/ML ~~LOC~~ SOLN: 45.0000 [IU] | Freq: Every day | SUBCUTANEOUS | Status: DC

## 2017-05-30 MED ORDER — INSULIN ASPART 100 UNIT/ML ~~LOC~~ SOLN: 0.0000 [IU] | Freq: Three times a day (TID) | SUBCUTANEOUS | Status: DC

## 2017-05-30 MED ORDER — ONDANSETRON HCL 4 MG/2ML IJ SOLN
4.0000 mg | Freq: Four times a day (QID) | INTRAMUSCULAR | Status: DC | PRN
  Administered 2017-05-31: 4 mg via INTRAVENOUS
  Filled 2017-05-30: qty 2

## 2017-05-30 MED ORDER — ALPRAZOLAM 1 MG PO TABS
1.0000 mg | ORAL_TABLET | Freq: Two times a day (BID) | ORAL | Status: DC
  Administered 2017-05-30 – 2017-05-31 (×2): 1 mg via ORAL
  Filled 2017-05-30 (×2): qty 2

## 2017-05-30 MED ORDER — SODIUM CHLORIDE 0.9 % IV SOLN: Freq: Once | INTRAVENOUS | Status: DC

## 2017-05-30 MED ORDER — ISOSORBIDE MONONITRATE ER 30 MG PO TB24
30.0000 mg | ORAL_TABLET | Freq: Every day | ORAL | Status: DC
  Administered 2017-05-31: 30 mg via ORAL
  Filled 2017-05-30: qty 1

## 2017-05-30 MED ORDER — DICYCLOMINE HCL 20 MG PO TABS
20.0000 mg | ORAL_TABLET | Freq: Two times a day (BID) | ORAL | Status: DC
  Administered 2017-05-31 (×2): 20 mg via ORAL
  Filled 2017-05-30 (×2): qty 1

## 2017-05-30 MED ORDER — QUETIAPINE FUMARATE 100 MG PO TABS
100.0000 mg | ORAL_TABLET | Freq: Every day | ORAL | Status: DC
  Administered 2017-05-30: 100 mg via ORAL
  Filled 2017-05-30: qty 1

## 2017-05-30 MED ORDER — RIFAXIMIN 550 MG PO TABS
550.0000 mg | ORAL_TABLET | Freq: Two times a day (BID) | ORAL | Status: DC
  Administered 2017-05-30 – 2017-05-31 (×2): 550 mg via ORAL
  Filled 2017-05-30 (×2): qty 1

## 2017-05-30 MED ORDER — FERROUS GLUCONATE 324 (38 FE) MG PO TABS
324.0000 mg | ORAL_TABLET | Freq: Three times a day (TID) | ORAL | Status: DC
  Administered 2017-05-31: 324 mg via ORAL
  Filled 2017-05-30 (×3): qty 1

## 2017-05-30 MED ORDER — TORSEMIDE 20 MG PO TABS
40.0000 mg | ORAL_TABLET | Freq: Every day | ORAL | Status: DC
  Administered 2017-05-31: 40 mg via ORAL
  Filled 2017-05-30: qty 2

## 2017-05-30 MED ORDER — OXYCODONE HCL 5 MG PO TABS
5.0000 mg | ORAL_TABLET | Freq: Four times a day (QID) | ORAL | Status: DC | PRN
Start: 2017-05-30 — End: 2017-05-31
  Administered 2017-05-31: 5 mg via ORAL
  Filled 2017-05-30: qty 1

## 2017-05-30 MED ORDER — LEVALBUTEROL HCL 0.63 MG/3ML IN NEBU: 0.6300 mg | INHALATION_SOLUTION | Freq: Two times a day (BID) | RESPIRATORY_TRACT | Status: DC

## 2017-05-30 MED ORDER — ADULT MULTIVITAMIN W/MINERALS CH
1.0000 | ORAL_TABLET | Freq: Every day | ORAL | Status: DC
  Administered 2017-05-31: 1 via ORAL
  Filled 2017-05-30: qty 1

## 2017-05-30 MED ORDER — QUETIAPINE FUMARATE 100 MG PO TABS: 100.0000 mg | ORAL_TABLET | Freq: Every day | ORAL | Status: DC

## 2017-05-30 MED ORDER — ROPINIROLE HCL 1 MG PO TABS
4.0000 mg | ORAL_TABLET | Freq: Every day | ORAL | Status: DC
  Filled 2017-05-30: qty 4

## 2017-05-30 MED ORDER — PANTOPRAZOLE SODIUM 40 MG PO TBEC
40.0000 mg | DELAYED_RELEASE_TABLET | Freq: Two times a day (BID) | ORAL | Status: DC
  Administered 2017-05-30 – 2017-05-31 (×2): 40 mg via ORAL
  Filled 2017-05-30 (×2): qty 1

## 2017-05-30 MED ORDER — LACTULOSE 10 GM/15ML PO SOLN
20.0000 g | Freq: Two times a day (BID) | ORAL | Status: DC
  Administered 2017-05-30 – 2017-05-31 (×2): 20 g via ORAL
  Filled 2017-05-30 (×2): qty 30

## 2017-05-30 NOTE — Progress Notes (Signed)
[facility] Liaison: RN visit  Spoke with patient and daughter at bedside. Patient denies pain and appears comfortable. Currently waiting on labs and will likely have one unit transfused in ED then return home. Dr. Earlie Counts with [facility] is aware of patient's ED status. \  Hospice will continue to follow and anticipate any discharge needs.   Please call with any hospice related questions.  Thank you  Charlett Blake, [facility] Liaison [phone]  All hospital liaisons are on [place]

## 2017-05-30 NOTE — ED Notes (Signed)
Family updated to anticipate delay with regards to blood transfusion.

## 2017-05-30 NOTE — ED Triage Notes (Signed)
Pt is presented from [facility] for blood transfusion per daughter request following a Hgb result of 6.7 labs collected 05/28/2017. Pt has no other complaints at this time.

## 2017-05-30 NOTE — ED Provider Notes (Signed)
Patient is determined to have a hemoglobin 6.7 at nursing home. Patient complains of generalized weakness and feeling tired. No other associated symptoms. Patient is alert and appears in no distress talkative and will probably lungs clear auscultation heart regular rate and rhythm abdomen nondistended nontender   Orlie Dakin, MD 05/30/17 1556
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
## 2017-05-30 NOTE — ED Notes (Signed)
Bed: YB63 Expected date:  Expected time:  Means of arrival:  Comments: EMS-low hemoglobin

## 2017-05-30 NOTE — ED Notes (Signed)
Received a call from blood bank (spoke to technician Manuela Schwartz) advising that pt's transfusion unit will take several hours to be ready, explaining that due to "noted antibody issues." .

## 2017-05-30 NOTE — ED Notes (Signed)
Put a bedside commode in pt room.  Pt does not feel like she can urinate now.

## 2017-05-30 NOTE — H&P (Signed)
History and Physical    [name] VEH:[id number] DOB: 1940-12-21 DOA: 05/30/2017  PCP: Garwin Brothers, MD  Patient coming from: SNF / hospice  I have personally briefly reviewed patient's old medical records in [place]  Chief Complaint: Anemia  HPI: [name] is a 76 y.o. female with medical history significant of cirrhosis, GAVE with chronic GI bleed.  Patient is in hospice at baseline for chronic GIB from [facility].  Also has numerous other chronic medical conditions as well.  Patient recently admitted Sept 20-22 for transfusion.  She has required numerous transfusions in past.  Family realizes that they cant keep transfusing her indefinitely, and there is no good long term treatment options at this point, thus she was placed on hospice after the Sept 20 admission.  HGB noted to be dropping in hospice with ongoing bouts of melena.  Had black stools this morning but brown stools yesterday.  Currently goal of care is to transfuse patient only to try and keep her alive long enough for her Son who lives in [place] to have a chance to come and visit his mother before she passes on.  No vomiting nor hematemesis.  No weakness, dizziness, chest pain.   ED Course: Plan was for 1 unit PRBC transfusion   Review of Systems: As per HPI otherwise 10 point review of systems negative.   Past Medical History:  Diagnosis Date  . Adenomatous colon polyp   . Anxiety   . Asthma   . Back pain   . Benign neoplasm of colon   . CAP (community acquired pneumonia) 2015  . Carpal tunnel syndrome on both sides   . Chronic airway obstruction, not elsewhere classified   . Chronic diastolic CHF (congestive heart failure) ([place])    a. 03/26/2014 Echo: EF 55-60%, no rwma, mild AS/AI, mod-sev Ca2+ MV annulus, mildly to mod dil LA.  [name] Coarse tremors    a. arms.  . Diverticulosis of colon (without mention of hemorrhage)   . Esophageal reflux   . Falls frequently    completed HHPT/OT 06/2014, PT  unmet goals  . GAVE (gastric antral vascular ectasia)    angiodysplasia; s/p multiple APCs, discussing RFA with [facility] GI Dr Newman Pies (06/2015)  . H/O hiatal hernia   . Hepatitis   . Hiatal hernia   . Iron deficiency anemia secondary to blood loss (chronic)    a. frequent PRBC transfusions.  . Liver cirrhosis secondary to nonalcoholic steatohepatitis (NASH) ([place])    a. dx'd 1990's  . Major depressive disorder, recurrent episode, severe, specified as with psychotic behavior   . Memory loss   . Midsternal chest pain    a. conservatively managed ->poor candidate for cath/anticoagulation.  . Mild aortic stenosis    a. 03/2014 Valve area (VTI): 2.89 cm^2, Valve area (Vmax): 2.7 cm^2.  . Morbid obesity ([place])   . Neck pain   . Non-obstructive CAD   . On home oxygen therapy    "3L; 24/7" (11/29/2015)  . OSA (obstructive sleep apnea)    a. does not use CPAP. (01/25/2015)  . Osteoarthrosis, unspecified whether generalized or localized, unspecified site   . Osteoporosis, unspecified   . Palpitations   . Portal hypertensive gastropathy ([place])   . Protein calorie malnutrition ([place])   . Pure hypercholesterolemia   . Recurrent UTI (urinary tract infection)    h/o hospitalization with urosepsis 2015, but thought large component colonization/bacteriuria, only treat if symptomatic ([place])  . Restless legs syndrome (RLS)   .  Thrombocytopenia ([place])   . Type II diabetes mellitus ([place])   . Unspecified chronic bronchitis ([place])   . Unspecified essential hypertension     Past Surgical History:  Procedure Laterality Date  . APPENDECTOMY    . APPENDECTOMY    . BALLOON DILATION  06/12/2012   Procedure: BALLOON DILATION;  Surgeon: Lafayette Dragon, MD;  Location: [facility] ENDOSCOPY;  Service: Endoscopy;  Laterality: N/A;  ?balloon  . BREAST BIOPSY     bilateral  . CESAREAN SECTION     x 3  . ENTEROSCOPY N/A 02/25/2015   Procedure: ENTEROSCOPY;  Surgeon: [name] Ada, MD;  Location: [facility] ENDOSCOPY;  Service:  Endoscopy;  Laterality: N/A;  . ENTEROSCOPY N/A 06/27/2015   Procedure: ENTEROSCOPY;  Surgeon: Manus Gunning, MD;  Location: [facility] ENDOSCOPY;  Service: Gastroenterology;  Laterality: N/A;  . ESOPHAGOGASTRODUODENOSCOPY  06/12/2012   Procedure: ESOPHAGOGASTRODUODENOSCOPY (EGD);  Surgeon: Lafayette Dragon, MD;  Location: [facility];  Service: Endoscopy;  Laterality: N/A;  . ESOPHAGOGASTRODUODENOSCOPY  09/10/2012   Procedure: ESOPHAGOGASTRODUODENOSCOPY (EGD);  Surgeon: Lafayette Dragon, MD;  Location: [facility];  Service: Endoscopy;  Laterality: N/A;  . ESOPHAGOGASTRODUODENOSCOPY N/A 01/18/2013   Procedure: ESOPHAGOGASTRODUODENOSCOPY (EGD);  Surgeon: Lafayette Dragon, MD;  Location: [facility] ENDOSCOPY;  Service: Endoscopy;  Laterality: N/A;  . ESOPHAGOGASTRODUODENOSCOPY N/A 05/24/2013   Procedure: ESOPHAGOGASTRODUODENOSCOPY (EGD);  Surgeon: Jerene Bears, MD;  Location: [facility];  Service: Gastroenterology;  Laterality: N/A;  . ESOPHAGOGASTRODUODENOSCOPY N/A 10/20/2014   Procedure: ESOPHAGOGASTRODUODENOSCOPY (EGD);  Surgeon: Lafayette Dragon, MD;  Location: [facility];  Service: Endoscopy;  Laterality: N/A;  . EUS  09/2015   antral CAVL, ablated; nodular erosive gastritis; mult pancreatic cysts rec rpt CT pancreas protocol or MRI to survey pancreas cysts 1-2 yrs (Dr Amie Critchley)  . HEMORROIDECTOMY    . HOT HEMOSTASIS  09/10/2012   Procedure: HOT HEMOSTASIS (ARGON PLASMA COAGULATION/BICAP);  Surgeon: Lafayette Dragon, MD;  Location: [facility];  Service: Endoscopy;  Laterality: N/A;  . HOT HEMOSTASIS N/A 05/24/2013   Procedure: HOT HEMOSTASIS (ARGON PLASMA COAGULATION/BICAP);  Surgeon: Jerene Bears, MD;  Location: [facility];  Service: Gastroenterology;  Laterality: N/A;  . HOT HEMOSTASIS N/A 10/20/2014   Procedure: HOT HEMOSTASIS (ARGON PLASMA COAGULATION/BICAP);  Surgeon: Lafayette Dragon, MD;  Location: [facility];  Service: Endoscopy;  Laterality: N/A;  . US ECHOCARDIOGRAPHY  07/2014   mild LVH, EF 60-65%,  normal wall motion, mild AR, mod dilated LA, mildly dilated RA, peak PA pressure 37mmHg  . VAGINAL HYSTERECTOMY       reports that she quit smoking about 24 years ago. Her smoking use included Cigarettes. She has a 37.50 pack-year smoking history. She has never used smokeless tobacco. She reports that she does not drink alcohol or use drugs.  Allergies  Allergen Reactions  . Acetaminophen Other (See Comments)    Liver dysfunction  . Nsaids Other (See Comments)    Liver dysfunction   . Aspirin Other (See Comments)    Causes nosebleeds  . Theophylline Nausea And Vomiting  . Penicillin G Itching  . Penicillins Itching    Has patient had a PCN reaction causing immediate rash, facial/tongue/throat swelling, SOB or lightheadedness with hypotension: unknown Has patient had a PCN reaction causing severe rash involving mucus membranes or skin necrosis: unknown Has patient had a PCN reaction that required hospitalization unknown Has patient had a PCN reaction occurring within the last 10 years: yes If all of the above answers are "NO", then may proceed with  Cephalosporin use.     Family History  Problem Relation Age of Onset  . Heart disease Mother   . Cervical cancer Mother   . Kidney disease Mother   . Diabetes Mother   . Heart attack Mother   . Diabetes Father   . Heart attack Father   . Multiple sclerosis Other   . Breast cancer Sister   . Multiple sclerosis Sister   . Breast cancer Maternal Aunt        x 2  . Diabetes Sister   . Stroke Daughter   . Stroke Daughter   . Lupus Daughter   . Colon cancer Neg Hx      Prior to Admission medications   Medication Sig Start Date End Date Taking? Authorizing Provider  ADVAIR DISKUS 250-50 MCG/DOSE AEPB INHALE 1 PUFF INTO LUNGS TWICE A DAY 05/29/15  Yes Ria Bush, MD  ALPRAZolam (XANAX) 1 MG tablet TAKE 1/2 TO 1 TABLET TWICE A DAY AS NEEDED FOR ANXIETY AND SLEEP Patient taking differently: Take 1 mg by mouth 2 (two) times  daily.  05/17/17  Yes Gherghe, Vella Redhead, MD  atorvastatin (LIPITOR) 20 MG tablet Take 20 mg by mouth daily.    Yes [provider]  buPROPion (WELLBUTRIN XL) 150 MG 24 hr tablet Take 150 mg by mouth daily.   Yes [provider]  dicyclomine (BENTYL) 20 MG tablet Take 1 tablet (20 mg total) by mouth 2 (two) times daily. 04/07/16  Yes Doristine Devoid, PA-C  ferrous gluconate (FERGON) 324 MG tablet Take 324 mg by mouth 3 (three) times daily with meals.   Yes [provider]  Insulin Glargine (LANTUS SOLOSTAR) 100 UNIT/ML Solostar Pen Inject 60 Units into the skin every morning.   Yes [provider]  insulin lispro (HUMALOG KWIKPEN) 100 UNIT/ML KiwkPen Inject 0-10 Units into the skin 4 (four) times daily -  before meals and at bedtime. 0-150=0 151-200=2 201-250=4 251-300=6 301-350=8 351-400=10   Yes [provider]  ipratropium-albuterol (DUONEB) 0.5-2.5 (3) MG/3ML SOLN Take 3 mLs by nebulization 2 (two) times daily. 03/25/16  Yes Noralee Space, MD  isosorbide mononitrate (IMDUR) 30 MG 24 hr tablet Take 1 tablet (30 mg total) by mouth daily. 04/01/16  Yes Weaver, Scott T, PA-C  Lactulose 20 GM/30ML SOLN Take 30 mLs (20 g total) by mouth 2 (two) times daily. But skip one dose if diarrhea 03/14/16  Yes Tonia Ghent, MD  oxyCODONE (OXY IR/ROXICODONE) 5 MG immediate release tablet Take 1 tablet (5 mg total) by mouth every 6 (six) hours as needed for moderate pain or severe pain. 05/17/17  Yes Caren Griffins, MD  OXYGEN Inhale 2 L into the lungs continuous.    Yes [provider]  pantoprazole (PROTONIX) 40 MG tablet TAKE 1 TABLET BY MOUTH TWICE A DAY Patient taking differently: Take 40 mg by mouth every morning. 10/12/15  Yes Ria Bush, MD  polyethylene glycol [facility]) packet Take 17 g by mouth daily. 04/07/16  Yes Leaphart, Zack Seal, PA-C  Propylene Glycol (SYSTANE BALANCE) 0.6 % SOLN Place 1 drop into both eyes every morning.   Yes  [provider]  QUEtiapine (SEROQUEL) 100 MG tablet Take 100 mg by mouth at bedtime.   Yes [provider]  rifaximin (XIFAXAN) 550 MG TABS tablet Take 550 mg by mouth 2 (two) times daily.   Yes [provider]  rOPINIRole (REQUIP) 4 MG tablet TAKE 1 TABLET BY MOUTH AT BEDTIME 02/12/16  Yes Ria Bush, MD  torsemide (DEMADEX) 20 MG tablet Take 40 mg by mouth daily.   Yes [provider]  traMADol (ULTRAM) 50 MG tablet Take 50 mg by mouth 2 (two) times daily. Not to be given with Xanax.   Yes [provider]  venlafaxine (EFFEXOR) 75 MG tablet Take 75 mg by mouth daily.   Yes [provider]  albuterol (PROAIR HFA) 108 (90 BASE) MCG/ACT inhaler Inhale 2 puffs into the lungs every 6 (six) hours as needed for wheezing or shortness of breath. Reported on 08/18/2015    [provider]  fexofenadine-pseudoephedrine (ALLEGRA-D 24) 180-240 MG 24 hr tablet Take 1 tablet by mouth daily as needed (allergies).     [provider]  glucose blood (ONE TOUCH ULTRA TEST) test strip Check blood sugar 4 times daily 10/16/15   Ria Bush, MD  Insulin Pen Needle (B-D ULTRAFINE III SHORT PEN) 31G X 8 MM MISC Use as directed to inject insulin twice daily. Dx: E11.8 10/13/15   Renato Shin, MD  levalbuterol Penne Lash) 0.63 MG/3ML nebulizer solution Take 0.63 mg by nebulization 2 (two) times daily.  06/05/14   [provider]  metolazone (ZAROXOLYN) 2.5 MG tablet Take 2.5 mg by mouth daily as needed (weight gain). TAKE 1 TABLET BY MOUTH  FOR 4 LB WEIGHT GAIN IN 24 HOURS PRN FOR WEIGHT GAIN    [provider]  Multiple Vitamin (MULTIVITAMIN WITH MINERALS) TABS tablet Take 1 tablet by mouth daily.     [provider]  nitroGLYCERIN (NITROSTAT) 0.4 MG SL tablet Place 1 tablet (0.4 mg total) under the tongue every 5 (five) minutes as needed for chest pain. No more than 3 in 15 minutes 02/16/16   Sherren Mocha, MD    [facility] DELICA LANCETS 46N MISC USE TO CHECK SUGAR 4 TIMES DAILY AND AS NEEDED. GE:X52.84 **ONE TOUCH DELICA** 1/32/44   Ria Bush, MD  promethazine (PHENERGAN) 25 MG tablet Take 25 mg by mouth 2 (two) times daily as needed for nausea or vomiting.    [provider]    Physical Exam: Vitals:   05/30/17 1524 05/30/17 1600 05/30/17 1700 05/30/17 1730  BP: (!) 150/58 (!) 125/49 130/86 (!) 113/48  Pulse: 65 (!) 59 (!) 59 65  Resp: 18 18 17  (!) 21  Temp: 98.5 F (36.9 C)     TempSrc: Oral     SpO2: 96% 98% 100% 100%    Constitutional: NAD, calm, comfortable Eyes: PERRL, lids and conjunctivae normal ENMT: Mucous membranes are moist. Posterior pharynx clear of any exudate or lesions.Normal dentition.  Neck: normal, supple, no masses, no thyromegaly Respiratory: clear to auscultation bilaterally, no wheezing, no crackles. Normal respiratory effort. No accessory muscle use.  Cardiovascular: Regular rate and rhythm, no murmurs / rubs / gallops. No extremity edema. 2+ pedal pulses. No carotid bruits.  Abdomen: no tenderness, no masses palpated. No hepatosplenomegaly. Bowel sounds positive.  Musculoskeletal: no clubbing / cyanosis. No joint deformity upper and lower extremities. Good ROM, no contractures. Normal muscle tone.  Skin: no rashes, lesions, ulcers. No induration Neurologic: CN 2-12 grossly intact. Sensation intact, DTR normal. Strength 5/5 in all 4.  Psychiatric: Pleasantly confused   Labs on Admission: I have personally reviewed following labs and imaging studies  CBC:  Recent Labs Lab 05/30/17 1630  WBC 3.0*  HGB 6.6*  HCT 21.1*  MCV 102.4*  PLT 80*   Basic Metabolic Panel:  Recent Labs Lab 05/30/17 1630  NA 141  K  3.4*  CL 106  CO2 28  GLUCOSE 180*  BUN 24*  CREATININE 1.10*  CALCIUM 8.3*   GFR: CrCl cannot be calculated (Unknown ideal weight.). Liver Function Tests:  Recent Labs Lab 05/30/17 1630  AST 54*  ALT 49  ALKPHOS 138*   BILITOT 1.0  PROT 4.8*  ALBUMIN 2.8*   No results for input(s): LIPASE, AMYLASE in the last 168 hours. No results for input(s): AMMONIA in the last 168 hours. Coagulation Profile: No results for input(s): INR, PROTIME in the last 168 hours. Cardiac Enzymes: No results for input(s): CKTOTAL, CKMB, CKMBINDEX, TROPONINI in the last 168 hours. BNP (last 3 results) No results for input(s): PROBNP in the last 8760 hours. HbA1C: No results for input(s): HGBA1C in the last 72 hours. CBG: No results for input(s): GLUCAP in the last 168 hours. Lipid Profile: No results for input(s): CHOL, HDL, LDLCALC, TRIG, CHOLHDL, LDLDIRECT in the last 72 hours. Thyroid Function Tests: No results for input(s): TSH, T4TOTAL, FREET4, T3FREE, THYROIDAB in the last 72 hours. Anemia Panel: No results for input(s): VITAMINB12, FOLATE, FERRITIN, TIBC, IRON, RETICCTPCT in the last 72 hours. Urine analysis:    Component Value Date/Time   COLORURINE YELLOW 05/30/2017 [place] 05/30/2017 1749   LABSPEC 1.010 05/30/2017 1749   PHURINE 5.0 05/30/2017 1749   GLUCOSEU NEGATIVE 05/30/2017 1749   GLUCOSEU NEGATIVE 07/17/2015 0907   HGBUR NEGATIVE 05/30/2017 1749   BILIRUBINUR NEGATIVE 05/30/2017 1749   BILIRUBINUR negative 02/26/2016 [place] 05/30/2017 1749   PROTEINUR NEGATIVE 05/30/2017 1749   UROBILINOGEN 4.0 02/26/2016 1206   UROBILINOGEN 0.2 07/17/2015 0907   NITRITE NEGATIVE 05/30/2017 1749   LEUKOCYTESUR SMALL (A) 05/30/2017 1749    Radiological Exams on Admission: Dg Chest 2 View  Result Date: 05/30/2017 CLINICAL DATA:  Initial evaluation for acute shortness of breath. EXAM: CHEST  2 VIEW COMPARISON:  Prior radiograph from 07/08/2016. FINDINGS: Mild cardiomegaly. Mediastinal silhouette normal. Aortic atherosclerosis. Lungs hypoinflated. Streaky left perihilar and basilar atelectasis and/ or scarring. No focal infiltrates. No pulmonary edema or pleural effusion. No  pneumothorax. No acute osseus abnormality. IMPRESSION: 1. Mild left perihilar and basilar atelectasis and/or scarring. 2. No other active cardiopulmonary disease. 3. Mild cardiomegaly. 4. Aortic atherosclerosis. Electronically Signed   By: Jeannine Boga M.D.   On: 05/30/2017 16:25    EKG: Independently reviewed.  Assessment/Plan Principal Problem:   Anemia due to chronic blood loss Active Problems:   GAVE (gastric antral vascular ectasia)   Hepatic cirrhosis ([place])   Palliative care status   Diabetes ([place])    1. Anemia due to chronic blood loss - See HPI above, hospice care status 1. Goal of care is to transfuse patient so Son has chance to get to [place] from out of state to visit patient before she passes. 2. Will transfuse 2 units PRBC in obs 3. May need to give patient her 2.5mg  Zaroxolyn that she takes PRN tomorrow AM. 4. Will obtain h/h post transfusion 5. No further work up planned. 6. If tolerates transfusion and H/H reasonable, plan to discharge back to hospice tomorrow. 2. DM2 - 1. Half home Lantus 30 units ordered for tomorrow AM 2. Will increase SSI to Mod scale 3. Cirrhosis - ESLD - Continue other NH meds  DVT prophylaxis: None: palliative care status Code Status: DNR/DNI, mostly comfort measures at this point, see above Family Communication: Family at bedside Disposition Plan: SNF likely tomorrow if doing well post transfusion Consults called: None Admission  status: Place in [place], [place] [facility] Pager [phone]  If 7AM-7PM, please contact day team taking care of patient [email or web address] Password [email or web address]  05/30/2017, 10:29 PM

## 2017-05-30 NOTE — ED Notes (Signed)
Patient transported to X-ray 

## 2017-05-31 DIAGNOSIS — D500 Iron deficiency anemia secondary to blood loss (chronic): Secondary | ICD-10-CM

## 2017-05-31 DIAGNOSIS — K7469 Other cirrhosis of liver: Secondary | ICD-10-CM

## 2017-05-31 DIAGNOSIS — N183 Chronic kidney disease, stage 3 (moderate): Secondary | ICD-10-CM | POA: Diagnosis not present

## 2017-05-31 DIAGNOSIS — D649 Anemia, unspecified: Secondary | ICD-10-CM

## 2017-05-31 DIAGNOSIS — Z794 Long term (current) use of insulin: Secondary | ICD-10-CM | POA: Diagnosis not present

## 2017-05-31 DIAGNOSIS — E1122 Type 2 diabetes mellitus with diabetic chronic kidney disease: Secondary | ICD-10-CM | POA: Diagnosis not present

## 2017-05-31 DIAGNOSIS — K31819 Angiodysplasia of stomach and duodenum without bleeding: Secondary | ICD-10-CM

## 2017-05-31 LAB — BASIC METABOLIC PANEL
Anion gap: 8 (ref 5–15)
BUN: 21 mg/dL — AB (ref 6–20)
CALCIUM: 8.1 mg/dL — AB (ref 8.9–10.3)
CO2: 25 mmol/L (ref 22–32)
CREATININE: 0.95 mg/dL (ref 0.44–1.00)
Chloride: 107 mmol/L (ref 101–111)
GFR calc Af Amer: >60 mL/min (ref >60)
GFR calc non Af Amer: 58 mL/min — ABNORMAL LOW (ref >60)
GLUCOSE: 160 mg/dL — AB (ref 65–99)
Potassium: 3.5 mmol/L (ref 3.5–5.1)
Sodium: 140 mmol/L (ref 135–145)

## 2017-05-31 LAB — HEMOGLOBIN AND HEMATOCRIT, BLOOD
HEMATOCRIT: 27.9 % — AB (ref 36.0–46.0)
Hemoglobin: 8.9 g/dL — ABNORMAL LOW (ref 12.0–15.0)

## 2017-05-31 LAB — GLUCOSE, CAPILLARY
GLUCOSE-CAPILLARY: 202 mg/dL — AB (ref 65–99)
Glucose-Capillary: 155 mg/dL — ABNORMAL HIGH (ref 65–99)
Glucose-Capillary: 193 mg/dL — ABNORMAL HIGH (ref 65–99)

## 2017-05-31 MED ORDER — POTASSIUM CHLORIDE 20 MEQ PO PACK: 40.0000 meq | PACK | Freq: Once | ORAL | Status: DC

## 2017-05-31 MED ORDER — OXYCODONE HCL 5 MG PO TABS: 5.0000 mg | ORAL_TABLET | Freq: Four times a day (QID) | ORAL | 0 refills | Status: DC | PRN

## 2017-05-31 MED ORDER — TRAMADOL HCL 50 MG PO TABS: 50.0000 mg | ORAL_TABLET | Freq: Two times a day (BID) | ORAL | 0 refills | Status: DC

## 2017-05-31 MED ORDER — POTASSIUM CHLORIDE CRYS ER 20 MEQ PO TBCR: 40.0000 meq | EXTENDED_RELEASE_TABLET | Freq: Once | ORAL | Status: DC

## 2017-05-31 NOTE — Discharge Summary (Signed)
Physician Discharge Summary  [name] WPY:[id number] DOB: 11-17-1940 DOA: 05/30/2017  PCP: Garwin Brothers, MD  Admit date: 05/30/2017 Discharge date: 05/31/2017  Time spent: 50 minutes  Recommendations for Outpatient Follow-up:  1. Patient be discharged back to skilled nursing facility with hospice following. 2. Follow-up with M.D. at skilled nursing facility.   Discharge Diagnoses:  Principal Problem:   Anemia due to chronic blood loss Active Problems:   GAVE (gastric antral vascular ectasia)   Hepatic cirrhosis (HCC)   Palliative care status   Diabetes [facility])   Discharge Condition: Stable  Diet recommendation: Heart healthy  Filed Weights   05/30/17 2333  Weight: 78 kg (171 lb 15.3 oz)    History of present illness:  Per Dr. Christeen [name] is a 76 y.o. female with medical history significant of cirrhosis, GAVE with chronic GI bleed.  Patient is in hospice at baseline for chronic GIB from [place].  Also has numerous other chronic medical conditions as well. Patient recently admitted Sept 20-22 for transfusion.  She has required numerous transfusions in past. Family realizes that they cant keep transfusing her indefinitely, and there is no good long term treatment options at this point, thus she was placed on hospice after the Sept 20 admission. HGB noted to be dropping in hospice with ongoing bouts of melena.  Had black stools the morning of admission, but brown stools 1 day prior to admission.  Currently goal of care is to transfuse patient only to try and keep her alive long enough for her Son who lives in [place] to have a chance to come and visit his mother before she passes on. No vomiting nor hematemesis.  No weakness, dizziness, chest pain.   Hospital Course:  #1 anemia due to chronic blood loss secondary to GAVE Patient was admitted due to presentation with symptomatic anemia with a hemoglobin of 6.6. Patient noted to be on hospice secondary to chronic  GI blood loss from GAVE. Patient had presented from skilled nursing facility which was being followed by hospice and goal of care was to transfuse patient as needed. Goal at this time was to transfuse patient as needed until son has a chance to come from out of state to visit the patient before she passes. Patient was typed and crossed and transfused 2 units packed red blood cells. Patient's hemoglobin responded appropriately with hemoglobin, and up to 8.9 on day of discharge. Patient did not have any further melanotic stools. Patient also noted to be on oral iron supplementation. Patient tolerated transfusion without any complications and patient will be discharged back to the skilled nursing facility with hospice following.  #2 diabetes mellitus type 2 Patient was maintained on half home regimen of Lantus as well as sliding scale insulin.  #3 cirrhosis/end-stage liver disease Patient maintained on home regimen of lactulose, PPI, Xifaxan, torsemide.  The rest of patient's chronic medical issues remained stable throughout the hospitalization.  Procedures:  Transfuse 2 units packed red blood cells.  Consultations:  None  Discharge Exam: Vitals:   05/31/17 0622 05/31/17 0814  BP: (!) 139/51   Pulse: (!) 56   Resp: 16   Temp: 97.6 F (36.4 C)   SpO2: 100% 94%    General: NAD Cardiovascular: RRR Respiratory: CTAB  Discharge Instructions   Discharge Instructions    Diet - low sodium heart healthy    Complete by:  As directed    Increase activity slowly    Complete by:  As directed  Current Discharge Medication List    CONTINUE these medications which have CHANGED   Details  oxyCODONE (OXY IR/ROXICODONE) 5 MG immediate release tablet Take 1 tablet (5 mg total) by mouth every 6 (six) hours as needed for moderate pain or severe pain. Qty: 10 tablet, Refills: 0    traMADol (ULTRAM) 50 MG tablet Take 1 tablet (50 mg total) by mouth 2 (two) times daily. Not to be given  with Xanax. Qty: 20 tablet, Refills: 0      CONTINUE these medications which have NOT CHANGED   Details  ADVAIR DISKUS 250-50 MCG/DOSE AEPB INHALE 1 PUFF INTO LUNGS TWICE A DAY Qty: 60 each, Refills: 11    ALPRAZolam (XANAX) 1 MG tablet TAKE 1/2 TO 1 TABLET TWICE A DAY AS NEEDED FOR ANXIETY AND SLEEP Qty: 10 tablet, Refills: 0    atorvastatin (LIPITOR) 20 MG tablet Take 20 mg by mouth daily.     buPROPion (WELLBUTRIN XL) 150 MG 24 hr tablet Take 150 mg by mouth daily.    dicyclomine (BENTYL) 20 MG tablet Take 1 tablet (20 mg total) by mouth 2 (two) times daily. Qty: 20 tablet, Refills: 0    ferrous gluconate (FERGON) 324 MG tablet Take 324 mg by mouth 3 (three) times daily with meals.    Insulin Glargine (LANTUS SOLOSTAR) 100 UNIT/ML Solostar Pen Inject 60 Units into the skin every morning.    insulin lispro (HUMALOG KWIKPEN) 100 UNIT/ML KiwkPen Inject 0-10 Units into the skin 4 (four) times daily -  before meals and at bedtime. 0-150=0 151-200=2 201-250=4 251-300=6 301-350=8 351-400=10    ipratropium-albuterol (DUONEB) 0.5-2.5 (3) MG/3ML SOLN Take 3 mLs by nebulization 2 (two) times daily. Qty: 180 mL, Refills: 5    isosorbide mononitrate (IMDUR) 30 MG 24 hr tablet Take 1 tablet (30 mg total) by mouth daily. Qty: 30 tablet, Refills: 6   Associated Diagnoses: Precordial pain    Lactulose 20 GM/30ML SOLN Take 30 mLs (20 g total) by mouth 2 (two) times daily. But skip one dose if diarrhea Qty: 30 mL    OXYGEN Inhale 2 L into the lungs continuous.     pantoprazole (PROTONIX) 40 MG tablet TAKE 1 TABLET BY MOUTH TWICE A DAY Qty: 60 tablet, Refills: 11    polyethylene glycol (MIRALAX) packet Take 17 g by mouth daily. Qty: 2 each, Refills: 0    Propylene Glycol (SYSTANE BALANCE) 0.6 % SOLN Place 1 drop into both eyes every morning.    QUEtiapine (SEROQUEL) 100 MG tablet Take 100 mg by mouth at bedtime.    rifaximin (XIFAXAN) 550 MG TABS tablet Take 550 mg by mouth 2 (two)  times daily.    rOPINIRole (REQUIP) 4 MG tablet TAKE 1 TABLET BY MOUTH AT BEDTIME Qty: 30 tablet, Refills: 6    torsemide (DEMADEX) 20 MG tablet Take 40 mg by mouth daily.    venlafaxine (EFFEXOR) 75 MG tablet Take 75 mg by mouth daily.    albuterol (PROAIR HFA) 108 (90 BASE) MCG/ACT inhaler Inhale 2 puffs into the lungs every 6 (six) hours as needed for wheezing or shortness of breath. Reported on 08/18/2015    fexofenadine-pseudoephedrine (ALLEGRA-D 24) 180-240 MG 24 hr tablet Take 1 tablet by mouth daily as needed (allergies).     glucose blood (ONE TOUCH ULTRA TEST) test strip Check blood sugar 4 times daily Qty: 200 each, Refills: 3    Insulin Pen Needle (B-D ULTRAFINE III SHORT PEN) 31G X 8 MM MISC Use as directed to  inject insulin twice daily. Dx: E11.8 Qty: 200 each, Refills: 2    levalbuterol (XOPENEX) 0.63 MG/3ML nebulizer solution Take 0.63 mg by nebulization 2 (two) times daily.  Refills: 5    metolazone (ZAROXOLYN) 2.5 MG tablet Take 2.5 mg by mouth daily as needed (weight gain). TAKE 1 TABLET BY MOUTH  FOR 4 LB WEIGHT GAIN IN 24 HOURS PRN FOR WEIGHT GAIN    Multiple Vitamin (MULTIVITAMIN WITH MINERALS) TABS tablet Take 1 tablet by mouth daily.     nitroGLYCERIN (NITROSTAT) 0.4 MG SL tablet Place 1 tablet (0.4 mg total) under the tongue every 5 (five) minutes as needed for chest pain. No more than 3 in 15 minutes Qty: 25 tablet, Refills: 1   Associated Diagnoses: Chronic diastolic heart failure (HCC)    ONETOUCH DELICA LANCETS 78E MISC USE TO CHECK SUGAR 4 TIMES DAILY AND AS NEEDED. UM:P53.61 **ONE TOUCH DELICA** Qty: 443 each, Refills: 1    promethazine (PHENERGAN) 25 MG tablet Take 25 mg by mouth 2 (two) times daily as needed for nausea or vomiting.       Allergies  Allergen Reactions  . Acetaminophen Other (See Comments)    Liver dysfunction  . Nsaids Other (See Comments)    Liver dysfunction   . Aspirin Other (See Comments)    Causes nosebleeds  .  Theophylline Nausea And Vomiting  . Penicillin G Itching  . Penicillins Itching    Has patient had a PCN reaction causing immediate rash, facial/tongue/throat swelling, SOB or lightheadedness with hypotension: unknown Has patient had a PCN reaction causing severe rash involving mucus membranes or skin necrosis: unknown Has patient had a PCN reaction that required hospitalization unknown Has patient had a PCN reaction occurring within the last 10 years: yes If all of the above answers are "NO", then may proceed with Cephalosporin use.    Follow-up Information    MD AT SNF Follow up.        HOSPICE Follow up.   Why:  F/U AT FACILITY           The results of significant diagnostics from this hospitalization (including imaging, microbiology, ancillary and laboratory) are listed below for reference.    Significant Diagnostic Studies: Dg Chest 2 View  Result Date: 05/30/2017 CLINICAL DATA:  Initial evaluation for acute shortness of breath. EXAM: CHEST  2 VIEW COMPARISON:  Prior radiograph from 07/08/2016. FINDINGS: Mild cardiomegaly. Mediastinal silhouette normal. Aortic atherosclerosis. Lungs hypoinflated. Streaky left perihilar and basilar atelectasis and/ or scarring. No focal infiltrates. No pulmonary edema or pleural effusion. No pneumothorax. No acute osseus abnormality. IMPRESSION: 1. Mild left perihilar and basilar atelectasis and/or scarring. 2. No other active cardiopulmonary disease. 3. Mild cardiomegaly. 4. Aortic atherosclerosis. Electronically Signed   By: Jeannine Boga M.D.   On: 05/30/2017 16:25   Xr Foot 2 Views Left  Result Date: 05/08/2017 Radiographs of the left foot show interval callus formation at 5th MT distal shaft fracture site.    Microbiology: No results found for this or any previous visit (from the past 240 hour(s)).   Labs: Basic Metabolic Panel:  Recent Labs Lab 05/30/17 1630 05/31/17 0852  NA 141 140  K 3.4* 3.5  CL 106 107  CO2 28 25   GLUCOSE 180* 160*  BUN 24* 21*  CREATININE 1.10* 0.95  CALCIUM 8.3* 8.1*   Liver Function Tests:  Recent Labs Lab 05/30/17 1630  AST 54*  ALT 49  ALKPHOS 138*  BILITOT 1.0  PROT 4.8*  ALBUMIN  2.8*   No results for input(s): LIPASE, AMYLASE in the last 168 hours. No results for input(s): AMMONIA in the last 168 hours. CBC:  Recent Labs Lab 05/30/17 1630 05/31/17 0852  WBC 3.0*  --   HGB 6.6* 8.9*  HCT 21.1* 27.9*  MCV 102.4*  --   PLT 80*  --    Cardiac Enzymes: No results for input(s): CKTOTAL, CKMB, CKMBINDEX, TROPONINI in the last 168 hours. BNP: BNP (last 3 results) No results for input(s): BNP in the last 8760 hours.  ProBNP (last 3 results) No results for input(s): PROBNP in the last 8760 hours.  CBG:  Recent Labs Lab 05/31/17 0033 05/31/17 0746 05/31/17 1205  GLUCAP 202* 155* 193*       Signed:  Inara Dike MD.  [facility] 05/31/2017, 12:41 PM
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
## 2017-05-31 NOTE — Progress Notes (Signed)
Patient discharged to [facility] care via [facility]. Report called to RN. Patient stable at discharge.

## 2017-05-31 NOTE — Care Management (Signed)
[place] visit  This is a related and covered admission from 05/30/17 per MD Cherie Ouch.  Admission to [facility] 05/21/17 with Liver Cirrhosis and Anemia.  Patient is a DNR.  She was admitted after presenting with symptomatic anemia; generalized weakness and fatigue.  Hgb on admission noted to be 6.7.  Jenny Reichmann [facility] nurse notes that patient did receive (2) units PRBC's on last evening/hs.  RN visited with her in her room this AM and she is resting comfortably.  Her (2) daughters are sitting at her bedside this morning.  [name] - daughter notes, "Our family has decided that after this transfusion, we are going to let things be and allow things to just take their course."  RN acknowledged.  I have spoken with [place] nurse this AM and she notes that labs have returned today and Hgb is that of 8.9.  I have discussed with family follow up plans for 06/01/17; either here at [facility] or at the facility and they are grateful.  I have asked Jenny Reichmann to call [facility] with any questions or concerns and she agrees.    Please call with any hospice-related questions or concerns.  Thank you, Claire Shown. Mingo Amber RN MSN [facility] ON-CALL RN [phone]

## 2017-05-31 NOTE — Progress Notes (Signed)
LCSW following for disposition  Obtained clarity regarding patient disposition.  Patient returning to nursing facility: [facility]. Call placed to karen and updated and she is agreeable for patient return.  All paperwork sent via [place]. Family in room and confirms place. Call placed to [facility] and updated regarding discharge and confirmed return to [facility].  No other needs. Patient will transport by EMS.  Lane Hacker, MSW Clinical Social Work: [employer] Coverage for :  [phone]

## 2017-06-02 LAB — TYPE AND SCREEN
ABO/RH(D): O POS
Antibody Screen: NEGATIVE
DONOR AG TYPE: NEGATIVE
DONOR AG TYPE: NEGATIVE
UNIT DIVISION: 0
Unit division: 0

## 2017-06-02 LAB — BPAM RBC
BLOOD PRODUCT EXPIRATION DATE: 201811062359
Blood Product Expiration Date: 201811122359
ISSUE DATE / TIME: 201810060346
ISSUE DATE / TIME: 201810060056
UNIT TYPE AND RH: 5100
UNIT TYPE AND RH: 5100

## 2017-06-03 NOTE — Telephone Encounter (Signed)
Spoke to Alta, daughter, she said that her mom had to go to the hospital again on Friday for a blood transfusion. This is 3 in 45 days. They have not heard from Dr. Serena Croissant office, patient and family have discussed and do not want to proceed with further radiofrequency ablations, they state it is not lasting as long as it did before. They feel that Procrit is not helping at this point either. They have elected to call in hospice. [name] did say that if you had further recommendations, she would be glad to listen to them and would like to discuss them with you. Thank you.

## 2017-06-03 NOTE — Telephone Encounter (Signed)
I called back [name] to discuss options, left message, no one answered. I will call back tomorrow

## 2017-06-04 NOTE — Telephone Encounter (Signed)
[name] called back and we discussed her mother's case. She has refractory anemia due to severe GAVE. She had been followed at [facility] for radiofrequency ablation but has had a hard time following up with them for this issue. She has multiple comorbidities, deconditioned, recently enrolled in hospice for this issue, and does not want further blood transfusions. [name] asked about any other options to treat / prevent anemia from worsening. I believe we are getting RFA capability at [facility] hospital in the near future and discussed this with her, I may be able to do it for her in the near future if [facility] can't accommodate her. However, unclear if the patient would be disenrolled from hospice if she were to proceed with this, and how aggressive the patient wishes to be at this point in regards to any further invasive procedures. I have discussed the risks / benefits of this with them, and with her pulmonary issues she is at higher than average risk for anesthesia related complications. Further, I can't guarantee this procedure will work, and may not change her outcome, in regards to her short term risk for mortality. [name] is going to chat with Mrs. Platte about this and hospice and get back to me. I have reached out to the endoscopy dept to see what kind of time frame RFA may be available for this to be considered, although given our discussion today I think it may be most appropriate to keep her in hospice given her other comorbidities. I will touch base with them again in the next few days.  Ardis Hughs

## 2017-06-04 NOTE — Telephone Encounter (Signed)
Called again and no answer, will call back

## 2017-06-10 ENCOUNTER — Telehealth: Payer: Self-pay | Admitting: Gastroenterology

## 2017-06-10 NOTE — Telephone Encounter (Signed)
Called [name] back to discuss possible RFA after I received more information - I left a message and will call her back again tomorrow

## 2017-06-11 ENCOUNTER — Other Ambulatory Visit: Payer: Self-pay

## 2017-06-11 DIAGNOSIS — K754 Autoimmune hepatitis: Secondary | ICD-10-CM

## 2017-06-11 DIAGNOSIS — K31819 Angiodysplasia of stomach and duodenum without bleeding: Secondary | ICD-10-CM

## 2017-06-11 NOTE — Telephone Encounter (Signed)
Spoke to [place] to let her know of appointments and to look for prep instructions in the mail. She is taking her mom off of hospice. She is also going to have the facility draw some labs to check hemoglobin level and she will have them fax over the results.

## 2017-06-11 NOTE — Telephone Encounter (Signed)
Thank you Almyra Free. I have coordinated with Agustina Caroli, representative for RFA, who will be available for her procedure next Wed and make sure the equipment is there. I will see the patient on Tuesday to discuss this further with the patient and make sure she is stable from a pulmonary standpoint for a procedure.

## 2017-06-11 NOTE — Telephone Encounter (Signed)
Patient is scheduled at [facility] on Wednesday 10/24 at 1:00 for EGD with RFA. She is also scheduled to come into office on 10/23 at 4:00 for pre-visit with Dr. Havery Moros. I have called and lvm for [name] (daughter) to call re: scheduled appointments. I have put prep instructions in the mail today along with [place] appointment info.

## 2017-06-11 NOTE — Telephone Encounter (Signed)
[name] called back. She spoke with the patient and family. They wanted to proceed with an EGD with trial of RFA as a final measure to minimize blood loss and reduce need for RBC transfusion prior to staying in hospice program. I think this may be reasonable if her respiratory status is stable to tolerate an EGD. They think that it is. Her Hgb has since downtrended slightly, they may need another transfusion and nursing home will coordinate if needed. I spoke with representative for RFA and they are standing by to assist for a procedure if needed.   Almyra Free can you see if there is opening for EGD with MAC at [facility] next Wed afternoon (1PM or later) for RFA ablation for GAVE. If there is an opening can you book this for her. I would also like to see her next Tuesday afternoon for a quick clinic visit in clinic to make sure she is okay to tolerate a procedure. If Wed PM is available for an EGD for RFA please let me know so I can speak with the representative for RFA so they will be present for the procedure. Thanks

## 2017-06-13 ENCOUNTER — Encounter (HOSPITAL_COMMUNITY): Payer: Self-pay | Admitting: *Deleted

## 2017-06-13 ENCOUNTER — Telehealth: Payer: Self-pay | Admitting: Gastroenterology

## 2017-06-13 ENCOUNTER — Other Ambulatory Visit: Payer: Self-pay

## 2017-06-13 ENCOUNTER — Encounter (HOSPITAL_COMMUNITY): Payer: Self-pay | Admitting: Internal Medicine

## 2017-06-13 ENCOUNTER — Inpatient Hospital Stay (HOSPITAL_COMMUNITY)
Admission: EM | Admit: 2017-06-13 | Discharge: 2017-06-15 | DRG: 441 | Disposition: A | Discharge status: Skilled Nursing Facility | Attending: Family Medicine | Admitting: Family Medicine

## 2017-06-13 ENCOUNTER — Emergency Department (HOSPITAL_COMMUNITY)

## 2017-06-13 DIAGNOSIS — D649 Anemia, unspecified: Secondary | ICD-10-CM | POA: Diagnosis not present

## 2017-06-13 DIAGNOSIS — K449 Diaphragmatic hernia without obstruction or gangrene: Secondary | ICD-10-CM | POA: Diagnosis present

## 2017-06-13 DIAGNOSIS — E1122 Type 2 diabetes mellitus with diabetic chronic kidney disease: Secondary | ICD-10-CM | POA: Diagnosis not present

## 2017-06-13 DIAGNOSIS — Z820 Family history of epilepsy and other diseases of the nervous system: Secondary | ICD-10-CM

## 2017-06-13 DIAGNOSIS — D709 Neutropenia, unspecified: Secondary | ICD-10-CM | POA: Diagnosis present

## 2017-06-13 DIAGNOSIS — I130 Hypertensive heart and chronic kidney disease with heart failure and stage 1 through stage 4 chronic kidney disease, or unspecified chronic kidney disease: Secondary | ICD-10-CM | POA: Diagnosis present

## 2017-06-13 DIAGNOSIS — F319 Bipolar disorder, unspecified: Secondary | ICD-10-CM | POA: Diagnosis present

## 2017-06-13 DIAGNOSIS — Z833 Family history of diabetes mellitus: Secondary | ICD-10-CM

## 2017-06-13 DIAGNOSIS — Z79899 Other long term (current) drug therapy: Secondary | ICD-10-CM

## 2017-06-13 DIAGNOSIS — I110 Hypertensive heart disease with heart failure: Secondary | ICD-10-CM | POA: Diagnosis present

## 2017-06-13 DIAGNOSIS — M810 Age-related osteoporosis without current pathological fracture: Secondary | ICD-10-CM | POA: Diagnosis present

## 2017-06-13 DIAGNOSIS — Z86010 Personal history of colonic polyps: Secondary | ICD-10-CM

## 2017-06-13 DIAGNOSIS — Z8049 Family history of malignant neoplasm of other genital organs: Secondary | ICD-10-CM

## 2017-06-13 DIAGNOSIS — K7581 Nonalcoholic steatohepatitis (NASH): Secondary | ICD-10-CM | POA: Diagnosis present

## 2017-06-13 DIAGNOSIS — Z87891 Personal history of nicotine dependence: Secondary | ICD-10-CM

## 2017-06-13 DIAGNOSIS — Z841 Family history of disorders of kidney and ureter: Secondary | ICD-10-CM

## 2017-06-13 DIAGNOSIS — K754 Autoimmune hepatitis: Secondary | ICD-10-CM | POA: Diagnosis present

## 2017-06-13 DIAGNOSIS — D500 Iron deficiency anemia secondary to blood loss (chronic): Secondary | ICD-10-CM | POA: Diagnosis present

## 2017-06-13 DIAGNOSIS — N3000 Acute cystitis without hematuria: Secondary | ICD-10-CM | POA: Diagnosis present

## 2017-06-13 DIAGNOSIS — N183 Chronic kidney disease, stage 3 (moderate): Secondary | ICD-10-CM | POA: Diagnosis present

## 2017-06-13 DIAGNOSIS — Z9981 Dependence on supplemental oxygen: Secondary | ICD-10-CM

## 2017-06-13 DIAGNOSIS — F209 Schizophrenia, unspecified: Secondary | ICD-10-CM | POA: Diagnosis present

## 2017-06-13 DIAGNOSIS — I5032 Chronic diastolic (congestive) heart failure: Secondary | ICD-10-CM | POA: Diagnosis present

## 2017-06-13 DIAGNOSIS — D696 Thrombocytopenia, unspecified: Secondary | ICD-10-CM | POA: Diagnosis present

## 2017-06-13 DIAGNOSIS — K5730 Diverticulosis of large intestine without perforation or abscess without bleeding: Secondary | ICD-10-CM | POA: Diagnosis present

## 2017-06-13 DIAGNOSIS — E7800 Pure hypercholesterolemia, unspecified: Secondary | ICD-10-CM | POA: Diagnosis present

## 2017-06-13 DIAGNOSIS — K3189 Other diseases of stomach and duodenum: Secondary | ICD-10-CM | POA: Diagnosis present

## 2017-06-13 DIAGNOSIS — Z888 Allergy status to other drugs, medicaments and biological substances status: Secondary | ICD-10-CM

## 2017-06-13 DIAGNOSIS — D62 Acute posthemorrhagic anemia: Secondary | ICD-10-CM | POA: Diagnosis present

## 2017-06-13 DIAGNOSIS — I10 Essential (primary) hypertension: Secondary | ICD-10-CM | POA: Diagnosis present

## 2017-06-13 DIAGNOSIS — K5520 Angiodysplasia of colon without hemorrhage: Secondary | ICD-10-CM | POA: Diagnosis present

## 2017-06-13 DIAGNOSIS — J9611 Chronic respiratory failure with hypoxia: Secondary | ICD-10-CM | POA: Diagnosis present

## 2017-06-13 DIAGNOSIS — B9689 Other specified bacterial agents as the cause of diseases classified elsewhere: Secondary | ICD-10-CM | POA: Diagnosis present

## 2017-06-13 DIAGNOSIS — Z880 Allergy status to penicillin: Secondary | ICD-10-CM | POA: Diagnosis not present

## 2017-06-13 DIAGNOSIS — K7460 Unspecified cirrhosis of liver: Secondary | ICD-10-CM | POA: Diagnosis present

## 2017-06-13 DIAGNOSIS — K7200 Acute and subacute hepatic failure without coma: Principal | ICD-10-CM | POA: Diagnosis present

## 2017-06-13 DIAGNOSIS — G92 Toxic encephalopathy: Secondary | ICD-10-CM | POA: Diagnosis present

## 2017-06-13 DIAGNOSIS — Z803 Family history of malignant neoplasm of breast: Secondary | ICD-10-CM

## 2017-06-13 DIAGNOSIS — Z8249 Family history of ischemic heart disease and other diseases of the circulatory system: Secondary | ICD-10-CM

## 2017-06-13 DIAGNOSIS — Z886 Allergy status to analgesic agent status: Secondary | ICD-10-CM | POA: Diagnosis not present

## 2017-06-13 DIAGNOSIS — K766 Portal hypertension: Secondary | ICD-10-CM | POA: Diagnosis present

## 2017-06-13 DIAGNOSIS — R7989 Other specified abnormal findings of blood chemistry: Secondary | ICD-10-CM

## 2017-06-13 DIAGNOSIS — K7469 Other cirrhosis of liver: Secondary | ICD-10-CM | POA: Diagnosis not present

## 2017-06-13 DIAGNOSIS — F419 Anxiety disorder, unspecified: Secondary | ICD-10-CM | POA: Diagnosis present

## 2017-06-13 DIAGNOSIS — G2581 Restless legs syndrome: Secondary | ICD-10-CM | POA: Diagnosis present

## 2017-06-13 DIAGNOSIS — I350 Nonrheumatic aortic (valve) stenosis: Secondary | ICD-10-CM | POA: Diagnosis present

## 2017-06-13 DIAGNOSIS — E119 Type 2 diabetes mellitus without complications: Secondary | ICD-10-CM

## 2017-06-13 DIAGNOSIS — R410 Disorientation, unspecified: Secondary | ICD-10-CM

## 2017-06-13 DIAGNOSIS — Z6830 Body mass index (BMI) 30.0-30.9, adult: Secondary | ICD-10-CM

## 2017-06-13 DIAGNOSIS — Z87440 Personal history of urinary (tract) infections: Secondary | ICD-10-CM

## 2017-06-13 DIAGNOSIS — Z823 Family history of stroke: Secondary | ICD-10-CM

## 2017-06-13 DIAGNOSIS — K219 Gastro-esophageal reflux disease without esophagitis: Secondary | ICD-10-CM | POA: Diagnosis present

## 2017-06-13 DIAGNOSIS — N390 Urinary tract infection, site not specified: Secondary | ICD-10-CM | POA: Diagnosis not present

## 2017-06-13 DIAGNOSIS — M1990 Unspecified osteoarthritis, unspecified site: Secondary | ICD-10-CM | POA: Diagnosis present

## 2017-06-13 DIAGNOSIS — E7220 Disorder of urea cycle metabolism, unspecified: Secondary | ICD-10-CM | POA: Diagnosis present

## 2017-06-13 DIAGNOSIS — E876 Hypokalemia: Secondary | ICD-10-CM | POA: Diagnosis present

## 2017-06-13 DIAGNOSIS — J449 Chronic obstructive pulmonary disease, unspecified: Secondary | ICD-10-CM | POA: Diagnosis not present

## 2017-06-13 DIAGNOSIS — I2510 Atherosclerotic heart disease of native coronary artery without angina pectoris: Secondary | ICD-10-CM | POA: Diagnosis present

## 2017-06-13 DIAGNOSIS — K7682 Hepatic encephalopathy: Secondary | ICD-10-CM | POA: Diagnosis present

## 2017-06-13 DIAGNOSIS — K922 Gastrointestinal hemorrhage, unspecified: Secondary | ICD-10-CM | POA: Diagnosis present

## 2017-06-13 DIAGNOSIS — Z794 Long term (current) use of insulin: Secondary | ICD-10-CM

## 2017-06-13 DIAGNOSIS — Z66 Do not resuscitate: Secondary | ICD-10-CM | POA: Diagnosis present

## 2017-06-13 DIAGNOSIS — R001 Bradycardia, unspecified: Secondary | ICD-10-CM | POA: Diagnosis present

## 2017-06-13 DIAGNOSIS — E785 Hyperlipidemia, unspecified: Secondary | ICD-10-CM | POA: Diagnosis present

## 2017-06-13 DIAGNOSIS — G4733 Obstructive sleep apnea (adult) (pediatric): Secondary | ICD-10-CM | POA: Diagnosis present

## 2017-06-13 LAB — CBC WITH DIFFERENTIAL/PLATELET
BASOS ABS: 0 10*3/uL (ref 0.0–0.1)
Basophils Relative: 0 %
EOS ABS: 0.1 10*3/uL (ref 0.0–0.7)
Eosinophils Relative: 3 %
HEMATOCRIT: 19.4 % — AB (ref 36.0–46.0)
HEMOGLOBIN: 6.3 g/dL — AB (ref 12.0–15.0)
LYMPHS PCT: 11 %
Lymphs Abs: 0.3 10*3/uL — ABNORMAL LOW (ref 0.7–4.0)
MCH: 31.5 pg (ref 26.0–34.0)
MCHC: 32.5 g/dL (ref 30.0–36.0)
MCV: 97 fL (ref 78.0–100.0)
MONOS PCT: 10 %
Monocytes Absolute: 0.3 10*3/uL (ref 0.1–1.0)
NEUTROS PCT: 76 %
Neutro Abs: 2.3 10*3/uL (ref 1.7–7.7)
Platelets: 81 10*3/uL — ABNORMAL LOW (ref 150–400)
RBC: 2 MIL/uL — ABNORMAL LOW (ref 3.87–5.11)
RDW: 18.6 % — ABNORMAL HIGH (ref 11.5–15.5)
WBC: 3 10*3/uL — ABNORMAL LOW (ref 4.0–10.5)

## 2017-06-13 LAB — COMPREHENSIVE METABOLIC PANEL
ALBUMIN: 2.5 g/dL — AB (ref 3.5–5.0)
ALT: 35 U/L (ref 14–54)
ANION GAP: 11 (ref 5–15)
AST: 48 U/L — AB (ref 15–41)
Alkaline Phosphatase: 134 U/L — ABNORMAL HIGH (ref 38–126)
BILIRUBIN TOTAL: 1.2 mg/dL (ref 0.3–1.2)
BUN: 29 mg/dL — ABNORMAL HIGH (ref 6–20)
CALCIUM: 8.4 mg/dL — AB (ref 8.9–10.3)
CHLORIDE: 100 mmol/L — AB (ref 101–111)
CO2: 28 mmol/L (ref 22–32)
CREATININE: 1.17 mg/dL — AB (ref 0.44–1.00)
GFR calc Af Amer: 52 mL/min — ABNORMAL LOW (ref >60)
GFR, EST NON AFRICAN AMERICAN: 45 mL/min — AB (ref >60)
Glucose, Bld: 140 mg/dL — ABNORMAL HIGH (ref 65–99)
POTASSIUM: 3.2 mmol/L — AB (ref 3.5–5.1)
Sodium: 139 mmol/L (ref 135–145)
Total Protein: 4.7 g/dL — ABNORMAL LOW (ref 6.5–8.1)

## 2017-06-13 LAB — URINALYSIS, ROUTINE W REFLEX MICROSCOPIC
Bilirubin Urine: NEGATIVE
Glucose, UA: NEGATIVE mg/dL
Hgb urine dipstick: NEGATIVE
Ketones, ur: NEGATIVE mg/dL
Nitrite: POSITIVE — AB
PROTEIN: NEGATIVE mg/dL
SQUAMOUS EPITHELIAL / LPF: NONE SEEN
Specific Gravity, Urine: 1.008 (ref 1.005–1.030)
pH: 7 (ref 5.0–8.0)

## 2017-06-13 LAB — I-STAT TROPONIN, ED: TROPONIN I, POC: 0.01 ng/mL (ref 0.00–0.08)

## 2017-06-13 LAB — PHOSPHORUS: Phosphorus: 4 mg/dL (ref 2.5–4.6)

## 2017-06-13 LAB — PREPARE RBC (CROSSMATCH)

## 2017-06-13 LAB — PROTIME-INR
INR: 1.11
PROTHROMBIN TIME: 14.2 s (ref 11.4–15.2)

## 2017-06-13 LAB — AMMONIA: Ammonia: 86 umol/L — ABNORMAL HIGH (ref 9–35)

## 2017-06-13 LAB — BRAIN NATRIURETIC PEPTIDE: B NATRIURETIC PEPTIDE 5: 78.8 pg/mL (ref 0.0–100.0)

## 2017-06-13 LAB — MAGNESIUM: Magnesium: 2.1 mg/dL (ref 1.7–2.4)

## 2017-06-13 LAB — LIPASE, BLOOD: LIPASE: 28 U/L (ref 11–51)

## 2017-06-13 MED ORDER — SODIUM CHLORIDE 0.9 % IV SOLN
Freq: Once | INTRAVENOUS | Status: AC
  Administered 2017-06-14: 10 mL/h via INTRAVENOUS

## 2017-06-13 MED ORDER — LACTULOSE 10 GM/15ML PO SOLN
30.0000 g | Freq: Once | ORAL | Status: AC
  Administered 2017-06-14: 30 g via ORAL

## 2017-06-13 MED ORDER — DEXTROSE 5 % IV SOLN
1.0000 g | Freq: Once | INTRAVENOUS | Status: AC
  Administered 2017-06-13: 1 g via INTRAVENOUS
  Filled 2017-06-13: qty 10

## 2017-06-13 NOTE — ED Notes (Addendum)
Blood bank called and reported pt blood would not arrive until 0800. Pt and family informed of delay.

## 2017-06-13 NOTE — ED Notes (Signed)
Patient denies pain and is resting comfortably.  

## 2017-06-13 NOTE — H&P (Addendum)
[name] NFA:[id number] DOB: 02-01-1941 DOA: 06/13/2017     PCP: Garwin Brothers, MD   Outpatient Specialists: [facility] Patient coming from: [facility]   Chief Complaint: worsening confusion and abnormal labs  HPI: [name] is a 76 y.o. female with medical history significant of cirrhosis, GAVE with chronic GI bleed. NASH induced cirrhosis , DM 2, end-stage liver disease  Presented with  She endorses chronic abdominal pain and shortness of breath denies any chest pain she does endorse dysuria and burning urination although no fever associated the no chills nausea or vomiting.  Hemoglobin noted down to Hg down to 7.3 on 10/12  6.3 she was told to present to emergency department for transfusion  she has chronic melena after her admission on September 20 she felt to be not a candidate for father interventions and has made hospice care only.  Days ago she discussed with her primary GI possibility of proceeding to EGD withTRIALof RFA ablation for GAVE.he is scheduled for procedure on 24th of October.   Regarding pertinent Chronic problems: GAVE chronic bleeding patient has been on hospice with recently was discontinued    IN ER:  Temp (24hrs), Avg:98.7 F (37.1 C), Min:98.7 F (37.1 C), Max:98.7 F (37.1 C)      on arrival  ED Triage Vitals  Enc Vitals Group     BP 06/13/17 1802 (!) 159/49     Pulse Rate 06/13/17 1802 70     Resp 06/13/17 1802 18     Temp 06/13/17 1802 98.7 F (37.1 C)     Temp Source 06/13/17 1802 Oral     SpO2 06/13/17 1755 100 %     Weight --      Height --      Head Circumference --      Peak Flow --      Pain Score --      Pain Loc --      Pain Edu? --      Excl. in GC? --     Latest RR 17  98%  64  134/55   Troponin 0.01WBC 3.0 hemoglobin 6.3 down from 8.9  On October 6 Platelets 81 NA 139 Cr 1.17 up from 0.95 BUN 29 up from 21 Alb 2.5 Hemoglobin down to 6.3 ER order 2 units of blood K 3.2 ammonia elevated at 86 Lipase  26 INR 1.11 mg 2.1 Chest x-ray nonacute Following Medications were ordered in ER: Medications  0.9 %  sodium chloride infusion (not administered)  cefTRIAXone (ROCEPHIN) 1 g in dextrose 5 % 50 mL IVPB (not administered)    Hospitalist was called for admission for Symptomatic anemia acute on chronic encephalopathy likely secondary to hepatic encephalopathy and UTI  Review of Systems:    Pertinent positives include:  confusion  Constitutional:  No weight loss, night sweats, Fevers, chills, fatigue, weight loss  HEENT:  No headaches, Difficulty swallowing,Tooth/dental problems,Sore throat,  No sneezing, itching, ear ache, nasal congestion, post nasal drip,  Cardio-vascular:  No chest pain, Orthopnea, PND, anasarca, dizziness, palpitations.no Bilateral lower extremity swelling  GI:  No heartburn, indigestion, abdominal pain, nausea, vomiting, diarrhea, change in bowel habits, loss of appetite, melena, blood in stool, hematemesis Resp:  no shortness of breath at rest. No dyspnea on exertion, No excess mucus, no productive cough, No non-productive cough, No coughing up of blood.No change in color of mucus.No wheezing. Skin:  no rash or lesions. No jaundice GU:  no dysuria, change in  color of urine, no urgency or frequency. No straining to urinate.  No flank pain.  Musculoskeletal:  No joint pain or no joint swelling. No decreased range of motion. No back pain.  Psych:  No change in mood or affect. No depression or anxiety. No memory loss.  Neuro: no localizing neurological complaints, no tingling, no weakness, no double vision, no gait abnormality, no slurred speech,   As per HPI otherwise 10 point review of systems negative.   Past Medical History: Past Medical History:  Diagnosis Date  . Adenomatous colon polyp   . Anxiety   . Asthma   . Back pain   . Benign neoplasm of colon   . CAP (community acquired pneumonia) 2015  . Carpal tunnel syndrome on both sides   . Chronic  airway obstruction, not elsewhere classified   . Chronic diastolic CHF (congestive heart failure) ([place])    a. 03/26/2014 Echo: EF 55-60%, no rwma, mild AS/AI, mod-sev Ca2+ MV annulus, mildly to mod dil LA.  [name] Coarse tremors    a. arms.  . Diverticulosis of colon (without mention of hemorrhage)   . Esophageal reflux   . Falls frequently    completed HHPT/OT 06/2014, PT unmet goals  . GAVE (gastric antral vascular ectasia)    angiodysplasia; s/p multiple APCs, discussing RFA with [facility] GI Dr Newman Pies (06/2015)  . H/O hiatal hernia   . Hepatitis   . Hiatal hernia   . Hyperlipidemia   . Iron deficiency anemia secondary to blood loss (chronic)    a. frequent PRBC transfusions.  . Liver cirrhosis secondary to nonalcoholic steatohepatitis (NASH) ([place])    a. dx'd 1990's  . Major depressive disorder, recurrent episode, severe, specified as with psychotic behavior   . Memory loss   . Midsternal chest pain    a. conservatively managed ->poor candidate for cath/anticoagulation.  . Mild aortic stenosis    a. 03/2014 Valve area (VTI): 2.89 cm^2, Valve area (Vmax): 2.7 cm^2.  . Morbid obesity ([place])   . Neck pain   . Non-obstructive CAD   . On home oxygen therapy    "3L; 24/7" (11/29/2015)  . OSA (obstructive sleep apnea)    a. does not use CPAP. (01/25/2015)  . Osteoarthrosis, unspecified whether generalized or localized, unspecified site   . Osteoporosis, unspecified   . Palpitations   . Portal hypertensive gastropathy ([place])   . Protein calorie malnutrition ([place])   . Pure hypercholesterolemia   . Recurrent UTI (urinary tract infection)    h/o hospitalization with urosepsis 2015, but thought large component colonization/bacteriuria, only treat if symptomatic ([place])  . Restless legs syndrome (RLS)   . Schizophrenia ([place])   . Thrombocytopenia ([place])   . Type II diabetes mellitus ([place])   . Unspecified chronic bronchitis ([place])   . Unspecified essential hypertension    Past Surgical History:   Procedure Laterality Date  . APPENDECTOMY    . APPENDECTOMY    . BALLOON DILATION  06/12/2012   Procedure: BALLOON DILATION;  Surgeon: Lafayette Dragon, MD;  Location: [facility] ENDOSCOPY;  Service: Endoscopy;  Laterality: N/A;  ?balloon  . BREAST BIOPSY     bilateral  . CESAREAN SECTION     x 3  . ENTEROSCOPY N/A 02/25/2015   Procedure: ENTEROSCOPY;  Surgeon: [name] Ada, MD;  Location: [facility] ENDOSCOPY;  Service: Endoscopy;  Laterality: N/A;  . ENTEROSCOPY N/A 06/27/2015   Procedure: ENTEROSCOPY;  Surgeon: Manus Gunning, MD;  Location: [facility] ENDOSCOPY;  Service: Gastroenterology;  Laterality:  N/A;  . ESOPHAGOGASTRODUODENOSCOPY  06/12/2012   Procedure: ESOPHAGOGASTRODUODENOSCOPY (EGD);  Surgeon: Lafayette Dragon, MD;  Location: [facility];  Service: Endoscopy;  Laterality: N/A;  . ESOPHAGOGASTRODUODENOSCOPY  09/10/2012   Procedure: ESOPHAGOGASTRODUODENOSCOPY (EGD);  Surgeon: Lafayette Dragon, MD;  Location: [facility] END[facility];  Service: Endoscopy;  Laterality: N/A;  . ESOPHAGOGASTRODUODENOSCOPY N/A 01/18/2013   Procedure: ESOPHAGOGASTRODUODENOSCOPY (EGD);  Surgeon: Lafayette Dragon, MD;  Location: [facility] ENDOSCOPY;  Service: Endoscopy;  Laterality: N/A;  . ESOPHAGOGASTRODUODENOSCOPY N/A 05/24/2013   Procedure: ESOPHAGOGASTRODUODENOSCOPY (EGD);  Surgeon: Jerene Bears, MD;  Location: [place];  Service: Gastroenterology;  Laterality: N/A;  . ESOPHAGOGASTRODUODENOSCOPY N/A 10/20/2014   Procedure: ESOPHAGOGASTRODUODENOSCOPY (EGD);  Surgeon: Lafayette Dragon, MD;  Location: [facility] END[facility];  Service: Endoscopy;  Laterality: N/A;  . EUS  09/2015   antral CAVL, ablated; nodular erosive gastritis; mult pancreatic cysts rec rpt CT pancreas protocol or MRI to survey pancreas cysts 1-2 yrs (Dr Amie Critchley)  . HEMORROIDECTOMY    . HOT HEMOSTASIS  09/10/2012   Procedure: HOT HEMOSTASIS (ARGON PLASMA COAGULATION/BICAP);  Surgeon: Lafayette Dragon, MD;  Location: [facility];  Service: Endoscopy;  Laterality: N/A;  . HOT HEMOSTASIS N/A  05/24/2013   Procedure: HOT HEMOSTASIS (ARGON PLASMA COAGULATION/BICAP);  Surgeon: Jerene Bears, MD;  Location: [place];  Service: Gastroenterology;  Laterality: N/A;  . HOT HEMOSTASIS N/A 10/20/2014   Procedure: HOT HEMOSTASIS (ARGON PLASMA COAGULATION/BICAP);  Surgeon: Lafayette Dragon, MD;  Location: [facility];  Service: Endoscopy;  Laterality: N/A;  . US ECHOCARDIOGRAPHY  07/2014   mild LVH, EF 60-65%, normal wall motion, mild AR, mod dilated LA, mildly dilated RA, peak PA pressure 41mmHg  . VAGINAL HYSTERECTOMY       Social History:  Ambulatory   walker     reports that she quit smoking about 24 years ago. Her smoking use included Cigarettes. She has a 37.50 pack-year smoking history. She has never used smokeless tobacco. She reports that she does not drink alcohol or use drugs.  Allergies:   Allergies  Allergen Reactions  . Acetaminophen Other (See Comments)    Liver dysfunction  . Nsaids Other (See Comments)    Liver dysfunction   . Aspirin Other (See Comments)    Causes nosebleeds  . Theophylline Nausea And Vomiting  . Penicillin G Itching  . Penicillins Itching and Other (See Comments)    Has patient had a PCN reaction causing immediate rash, facial/tongue/throat swelling, SOB or lightheadedness with hypotension: unknown Has patient had a PCN reaction causing severe rash involving mucus membranes or skin necrosis: unknown Has patient had a PCN reaction that required hospitalization unknown Has patient had a PCN reaction occurring within the last 10 years: yes If all of the above answers are "NO", then may proceed with Cephalosporin use.        Family History:   Family History  Problem Relation Age of Onset  . Heart disease Mother   . Cervical cancer Mother   . Kidney disease Mother   . Diabetes Mother   . Heart attack Mother   . Diabetes Father   . Heart attack Father   . Multiple sclerosis Other   . Breast cancer Sister   . Multiple sclerosis Sister     . Breast cancer Maternal Aunt        x 2  . Diabetes Sister   . Stroke Daughter   . Stroke Daughter   . Lupus Daughter   . Colon cancer Neg Hx  Medications: Prior to Admission medications   Medication Sig Start Date End Date Taking? Authorizing Provider  ADVAIR DISKUS 250-50 MCG/DOSE AEPB INHALE 1 PUFF INTO LUNGS TWICE A DAY 05/29/15  Yes Ria Bush, MD  albuterol (PROVENTIL) (2.5 MG/3ML) 0.083% nebulizer solution Take 2.5 mg by nebulization every 6 (six) hours as needed for wheezing or shortness of breath.   Yes [provider]  ALPRAZolam (XANAX) 1 MG tablet TAKE 1/2 TO 1 TABLET TWICE A DAY AS NEEDED FOR ANXIETY AND SLEEP Patient taking differently: Take 1 mg by mouth See admin instructions. Take 1 mg by mouth daily. Take 1 mg by mouth every 12 hours as needed for anxiety 05/17/17  Yes Gherghe, Vella Redhead, MD  atorvastatin (LIPITOR) 20 MG tablet Take 20 mg by mouth daily.    Yes [provider]  buPROPion (WELLBUTRIN XL) 150 MG 24 hr tablet Take 150 mg by mouth daily.   Yes [provider]  dicyclomine (BENTYL) 20 MG tablet Take 1 tablet (20 mg total) by mouth 2 (two) times daily. 04/07/16  Yes Doristine Devoid, PA-C  ferrous gluconate (FERGON) 324 MG tablet Take 324 mg by mouth 3 (three) times daily with meals.   Yes [provider]  Insulin Glargine (LANTUS SOLOSTAR) 100 UNIT/ML Solostar Pen Inject 60 Units into the skin every morning.   Yes [provider]  insulin lispro (HUMALOG KWIKPEN) 100 UNIT/ML KiwkPen Inject 0-10 Units into the skin 4 (four) times daily -  before meals and at bedtime. 0-150=0 151-200=2 201-250=4 251-300=6 301-350=8 351-400=10   Yes [provider]  ipratropium-albuterol (DUONEB) 0.5-2.5 (3) MG/3ML SOLN Take 3 mLs by nebulization 2 (two) times daily. 03/25/16  Yes Noralee Space, MD  isosorbide mononitrate (IMDUR) 30 MG 24 hr tablet Take 1 tablet (30 mg total) by mouth daily. 04/01/16  Yes Weaver,  Scott T, PA-C  Lactulose 20 GM/30ML SOLN Take 30 mLs (20 g total) by mouth 2 (two) times daily. But skip one dose if diarrhea 03/14/16  Yes Tonia Ghent, MD  levalbuterol Penne Lash) 0.63 MG/3ML nebulizer solution Take 0.63 mg by nebulization 2 (two) times daily.  06/05/14  Yes [provider]  metolazone (ZAROXOLYN) 2.5 MG tablet Take 2.5 mg by mouth See admin instructions. Take 2.5 mg by mouth daily. Take 2.5 mg by mouth daily as needed for weight gain 4 lbs in 24 hours   Yes [provider]  Multiple Vitamin (MULTIVITAMIN WITH MINERALS) TABS tablet Take 1 tablet by mouth daily.    Yes [provider]  nitroGLYCERIN (NITROSTAT) 0.4 MG SL tablet Place 1 tablet (0.4 mg total) under the tongue every 5 (five) minutes as needed for chest pain. No more than 3 in 15 minutes 02/16/16  Yes Sherren Mocha, MD  oxyCODONE (OXY IR/ROXICODONE) 5 MG immediate release tablet Take 1 tablet (5 mg total) by mouth every 6 (six) hours as needed for moderate pain or severe pain. 05/31/17  Yes Eugenie Filler, MD  OXYGEN Inhale 2 L into the lungs continuous.    Yes [provider]  pantoprazole (PROTONIX) 40 MG tablet TAKE 1 TABLET BY MOUTH TWICE A DAY Patient taking differently: Take 20 mg by mouth twice daily 10/12/15  Yes Ria Bush, MD  polyethylene glycol [facility]) packet Take 17 g by mouth daily. 04/07/16  Yes Aaron Edelman  PROAIR HFA 108 [phone] Base) MCG/ACT inhaler Take 1-2 puffs by mouth every 6 (six) hours as needed for wheezing. 05/19/17  Yes [provider]  promethazine (PHENERGAN) 25 MG tablet Take 25 mg by mouth 2 (two) times daily as needed for nausea or vomiting.   Yes [provider]  Propylene Glycol (SYSTANE BALANCE) 0.6 % SOLN Place 1 drop into both eyes every morning.   Yes [provider]  QUEtiapine (SEROQUEL) 100 MG tablet Take 100 mg by mouth at bedtime.   Yes [provider]  rifaximin (XIFAXAN) 550 MG TABS  tablet Take 550 mg by mouth 2 (two) times daily.   Yes [provider]  rOPINIRole (REQUIP) 4 MG tablet TAKE 1 TABLET BY MOUTH AT BEDTIME Patient taking differently: TAKE 4 MG BY MOUTH AT BEDTIME 02/12/16  Yes Ria Bush, MD  torsemide (DEMADEX) 20 MG tablet Take 40 mg by mouth daily.   Yes [provider]  traMADol (ULTRAM) 50 MG tablet Take 1 tablet (50 mg total) by mouth 2 (two) times daily. Not to be given with Xanax. 05/31/17  Yes Eugenie Filler, MD  valproic acid (DEPAKENE) 250 MG capsule Take 250 mg by mouth 2 (two) times daily.   Yes [provider]  fexofenadine-pseudoephedrine (ALLEGRA-D 24) 180-240 MG 24 hr tablet Take 1 tablet by mouth daily as needed (allergies).     [provider]  glucose blood (ONE TOUCH ULTRA TEST) test strip Check blood sugar 4 times daily Patient not taking: Reported on 06/12/2017 10/16/15   Ria Bush, MD  Insulin Pen Needle (B-D ULTRAFINE III SHORT PEN) 31G X 8 MM MISC Use as directed to inject insulin twice daily. Dx: E11.8 Patient not taking: Reported on 06/12/2017 10/13/15   Renato Shin, MD  [facility] DELICA LANCETS 89F MISC USE TO CHECK SUGAR 4 TIMES DAILY AND AS NEEDED. YB:O17.51 **ONE TOUCH DELICA** 0/25/85   Ria Bush, MD    Physical Exam: Patient Vitals for the past 24 hrs:  BP Temp Temp src Pulse Resp SpO2  06/13/17 2140 (!) 134/55 - - 64 17 98 %  06/13/17 1845 (!) 146/59 - - 67 16 99 %  06/13/17 1802 (!) 159/49 98.7 F (37.1 C) Oral 70 18 100 %  06/13/17 1756 - - - - - 100 %  06/13/17 1755 - - - - - 100 %    1. General:  in No Acute distress   Chronically ill -appearing 2. Psychological: Alert and Oriented to self 3. Head/ENT:     Dry Mucous Membranes                          Head Non traumatic, neck supple                           Poor Dentition 4. SKIN:  decreased Skin turgor,  Skin clean Dry and intact no rash 5. Heart: Regular rate and rhythm no  Murmur, no Rub or  gallop 6. Lungs:  no wheezes or crackles   7. Abdomen: Soft,  non-tender, Non distended   obese  bowel sounds present 8. Lower extremities: no clubbing, cyanosis, or edema 9. Neurologically Grossly intact, moving all 4 extremities equally 10. MSK: Normal range of motion   body mass index is unknown because there is no height or weight on file.  Labs on Admission:   Labs on Admission: I have personally reviewed following labs and imaging studies  CBC:  Recent Labs Lab 06/13/17 1826  WBC 3.0*  NEUTROABS 2.3  HGB 6.3*  HCT 19.4*  MCV 97.0  PLT 81*   Basic Metabolic Panel:  Recent Labs Lab 06/13/17 1826  NA 139  K 3.2*  CL 100*  CO2 28  GLUCOSE 140*  BUN 29*  CREATININE 1.17*  CALCIUM 8.4*   GFR: Estimated Creatinine Clearance: 41.4 mL/min (A) (by C-G formula based on SCr of 1.17 mg/dL (H)). Liver Function Tests:  Recent Labs Lab 06/13/17 1826  AST 48*  ALT 35  ALKPHOS 134*  BILITOT 1.2  PROT 4.7*  ALBUMIN 2.5*    Recent Labs Lab 06/13/17 1826  LIPASE 28    Recent Labs Lab 06/13/17 1826  AMMONIA 86*   Coagulation Profile:  Recent Labs Lab 06/13/17 1826  INR 1.11   Cardiac Enzymes: No results for input(s): CKTOTAL, CKMB, CKMBINDEX, TROPONINI in the last 168 hours. BNP (last 3 results) No results for input(s): PROBNP in the last 8760 hours. HbA1C: No results for input(s): HGBA1C in the last 72 hours. CBG: No results for input(s): GLUCAP in the last 168 hours. Lipid Profile: No results for input(s): CHOL, HDL, LDLCALC, TRIG, CHOLHDL, LDLDIRECT in the last 72 hours. Thyroid Function Tests: No results for input(s): TSH, T4TOTAL, FREET4, T3FREE, THYROIDAB in the last 72 hours. Anemia Panel: No results for input(s): VITAMINB12, FOLATE, FERRITIN, TIBC, IRON, RETICCTPCT in the last 72 hours. Urine analysis:    Component Value Date/Time   COLORURINE YELLOW 06/13/2017 1826   APPEARANCEUR CLEAR 06/13/2017 1826   LABSPEC 1.008 06/13/2017 1826    PHURINE 7.0 06/13/2017 1826   GLUCOSEU NEGATIVE 06/13/2017 1826   GLUCOSEU NEGATIVE 07/17/2015 0907   HGBUR NEGATIVE 06/13/2017 1826   BILIRUBINUR NEGATIVE 06/13/2017 1826   BILIRUBINUR negative 02/26/2016 1206   KETONESUR NEGATIVE 06/13/2017 1826   PROTEINUR NEGATIVE 06/13/2017 1826   UROBILINOGEN 4.0 02/26/2016 1206   UROBILINOGEN 0.2 07/17/2015 0907   NITRITE POSITIVE (A) 06/13/2017 1826   LEUKOCYTESUR TRACE (A) 06/13/2017 1826   Sepsis Labs: @LABRCNTIP (procalcitonin:4,lacticidven:4) )No results found for this or any previous visit (from the past 240 hour(s)).    UA   evidence of UTI     Lab Results  Component Value Date   HGBA1C 5.6 03/01/2016    Estimated Creatinine Clearance: 41.4 mL/min (A) (by C-G formula based on SCr of 1.17 mg/dL (H)).  BNP (last 3 results) No results for input(s): PROBNP in the last 8760 hours.   ECG REPORT  Independently reviewed Rate: 64  Rhythm: NSR ST&T Change: No acute ischemic changes   QTC 482  There were no vitals filed for this visit.   Cultures:    Component Value Date/Time   SDES URINE, RANDOM 11/28/2015 0641   SPECREQUEST NONE 11/28/2015 0641   CULT MULTIPLE SPECIES PRESENT, SUGGEST RECOLLECTION 11/28/2015 0641   REPTSTATUS 11/29/2015 FINAL 11/28/2015 0641     Radiological Exams on Admission: Dg Chest 2 View  Result Date: 06/13/2017 CLINICAL DATA:  Shortness breath, chest pain EXAM: CHEST  2 VIEW COMPARISON:  05/30/2017 FINDINGS: The heart size and mediastinal contours are within normal limits. Both lungs are clear. The visualized skeletal structures are unremarkable. IMPRESSION: No active cardiopulmonary disease. Electronically Signed   By: Kathreen Devoid   On: 06/13/2017 20:10    Chart has been reviewed    Assessment/Plan  76 y.o. female with medical history significant of cirrhosis, GAVE with chronic GI bleed. NASH induced cirrhosis , DM 2, end-stage liver disease Admitted for Symptomatic anemia acute on chronic  encephalopathy likely secondary to hepatic encephalopathy and UTI   Present on Admission: .  Symptomatic anemia - secondary to recurrent GI bleeding. We'll transfuse 2 units when blood is available. Monitor on telemetry overnight . 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 Chronic diastolic CHF (congestive heart failure) (HCC)   avoid fluid overload currently appears to beeuvolemic . COPD with chronic bronchitis ([place]) current at baseline . Coronary atherosclerosis - Until recently patient has been on hospice at this point denies any chest pain shortness of breath continue to monitor not a candidate for aspirin . Acute lower UTI treat with Rocephin await results of urine culture . Acute hepatic encephalopathy - restart lactulose and increased to 3 times a day titrate as needed,  Continue RIFAXAN . Hypokalemia  - replace and check magnesium levels . Essential hypertension   restart her home medications . Gastrointestinal hemorrhage chronic will  need to follow-up with GI as an outpatient scheduled for EGD on the 24th please notify [facility] in a.m. The patient has been admitted   . Liver cirrhosis secondary to NASH (HCC) chronic long standing. MELD score 9  . Thrombocytopenia ([place]) chronic secondary to cirrhosis  Depression continue home medication DM2 -  - Order Sensitive SSI     -  check TSH and HgA1C  - Hold by mouth medications    Other plan as per orders.  DVT prophylaxis:  SCD    Code Status:    DNR/DNI   as per  family   Family Communication:   Family at  Bedside  plan of care was discussed with    Son, Daughter  Disposition Plan:     Back to current facility when stable                                                    Social Work    Consults called: none  Admission status:  inpatient      Level of care     tele           I have spent a total of 56 min on this admission   Stonewall Doss 06/14/2017, 1:40 AM    [facility]  Pager [phone]   after 2 AM please page floor coverage PA If 7AM-7PM, please contact the day team taking care of the patient  [email or web address]  Password [email or web address]

## 2017-06-13 NOTE — Progress Notes (Signed)
Preop instructions ATF:[id number] Tays   Date of Birth    1940-12-18                         Date of Procedure:  06-18-17      Doctor: armbruster Time to arrive at [facility]: 1130 am Report to: Admitting  Procedure: endoscopy Any procedure time changes, MD office will notify you!   Do not eat  past midnight the night before your procedure.(To include any tube feedings-must be discontinued) CLEAR LIQUIDS FROM MIDNIGHT UNTIL 700 AM THEN NOTHING BY MOUTH AFTER 700 AM DAY OF PROCEDURE. SEE LIST OF CLEAR LIQUIDS BELOW.    CLEAR LIQUID DIET   Foods Allowed                                                                     Foods Excluded  Coffee and tea, regular and decaf                             liquids that you cannot  Plain Jell-O in any flavor                                             see through such as: Fruit ices (not with fruit pulp)                                     milk, soups, orange juice  Iced Popsicles                                    All solid food Carbonated beverages, regular and diet                                    Cranberry, grape and apple juices Sports drinks like Gatorade Lightly seasoned clear broth or consume(fat free) Sugar, honey syrup  Sample Menu Breakfast                                Lunch                                     Supper Cranberry juice                    Beef broth                            Chicken broth Jell-O                                     Grape juice  Apple juice Coffee or tea                        Jell-O                                      Popsicle                                                Coffee or tea                        Coffee or tea  _____________________________________________________________________     Take these morning medications only with sips of water.(or give through gastrostomy or feeding tube).ADVAIR, ALBUTEROL NEBULIZER IF NEEDED, BUPROPION,  ISOSORBIDE MONONITRATE, SYSTANE EYE DROP, PROTONIX, VENLAFAXINE, XANAX, ADVAIR, IPRATROPIUM-ALBUTEROL NEBULIZER, LEVALBUTEROL NEBULIZER , [place]  Note: No Insulin or Diabetic meds should be given or taken the morning of the procedure!   Facility contact: Financial controller  Phone: [phone] FAX [phone] Health Care YQM:GNOIBBCW PENNY [facility]USHNO[facility] CELL [phone] SIGNS CONSENTS AND WILL BRING PATIENT   Please send day of procedure:current med list and meds last taken that day, confirm nothing by mouth status from what time, Patient Demographic info( to include DNR status, problem list, allergies)   Bring Insurance card and picture ID Leave all jewelry and other valuables at place where living( no metal or rings to be worn) No contact lens Women-no make-up, no lotions,perfumes,powders Men-no colognes,lotions  Any questions day of procedure,call Endoscopy unit-[phone]!   Sent from :[facility]                   Phone:[phone]                   [phone]  Sent by :Zelphia Cairo RN

## 2017-06-13 NOTE — ED Triage Notes (Addendum)
Pt arrived to [facility] via [place] with low hemoglobin sent from [facility]. Pt denies shortness of breath and chest pain.
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
## 2017-06-13 NOTE — Telephone Encounter (Signed)
Let Dr. Havery Moros know that patient's hgb is 6.0. V.O. from Dr. Havery Moros to have patient go to ED for evaluation and blood transfusion. I called and lvm for daughter to have them take her to ED. I also called to [facility] at [phone], spoke to Gueydan.,LPN they are arranging to transport patient to ED but are waiting on daughter to arrive before sending her.

## 2017-06-13 NOTE — ED Triage Notes (Signed)
Per EMS-low hemoglobin-needs a transfusion-history of the same

## 2017-06-14 DIAGNOSIS — D649 Anemia, unspecified: Secondary | ICD-10-CM | POA: Diagnosis not present

## 2017-06-14 DIAGNOSIS — N3000 Acute cystitis without hematuria: Secondary | ICD-10-CM | POA: Diagnosis not present

## 2017-06-14 DIAGNOSIS — K7200 Acute and subacute hepatic failure without coma: Secondary | ICD-10-CM | POA: Diagnosis not present

## 2017-06-14 DIAGNOSIS — I5032 Chronic diastolic (congestive) heart failure: Secondary | ICD-10-CM | POA: Diagnosis not present

## 2017-06-14 LAB — CBC
HCT: 21.5 % — ABNORMAL LOW (ref 36.0–46.0)
Hemoglobin: 6.9 g/dL — CL (ref 12.0–15.0)
MCH: 31.7 pg (ref 26.0–34.0)
MCHC: 32.1 g/dL (ref 30.0–36.0)
MCV: 98.6 fL (ref 78.0–100.0)
PLATELETS: 84 10*3/uL — AB (ref 150–400)
RBC: 2.18 MIL/uL — ABNORMAL LOW (ref 3.87–5.11)
RDW: 19 % — AB (ref 11.5–15.5)
WBC: 4.2 10*3/uL (ref 4.0–10.5)

## 2017-06-14 LAB — CBG MONITORING, ED: GLUCOSE-CAPILLARY: 76 mg/dL (ref 65–99)

## 2017-06-14 LAB — COMPREHENSIVE METABOLIC PANEL
ALK PHOS: 134 U/L — AB (ref 38–126)
ALT: 38 U/L (ref 14–54)
AST: 44 U/L — AB (ref 15–41)
Albumin: 2.6 g/dL — ABNORMAL LOW (ref 3.5–5.0)
Anion gap: 5 (ref 5–15)
BILIRUBIN TOTAL: 0.9 mg/dL (ref 0.3–1.2)
BUN: 26 mg/dL — AB (ref 6–20)
CALCIUM: 8.4 mg/dL — AB (ref 8.9–10.3)
CO2: 31 mmol/L (ref 22–32)
CREATININE: 1.06 mg/dL — AB (ref 0.44–1.00)
Chloride: 105 mmol/L (ref 101–111)
GFR calc Af Amer: 59 mL/min — ABNORMAL LOW (ref >60)
GFR calc non Af Amer: 51 mL/min — ABNORMAL LOW (ref >60)
GLUCOSE: 79 mg/dL (ref 65–99)
POTASSIUM: 3.7 mmol/L (ref 3.5–5.1)
Sodium: 141 mmol/L (ref 135–145)
TOTAL PROTEIN: 4.9 g/dL — AB (ref 6.5–8.1)

## 2017-06-14 LAB — TSH: TSH: 2.131 u[IU]/mL (ref 0.350–4.500)

## 2017-06-14 LAB — GLUCOSE, CAPILLARY
GLUCOSE-CAPILLARY: 96 mg/dL (ref 65–99)
Glucose-Capillary: 115 mg/dL — ABNORMAL HIGH (ref 65–99)
Glucose-Capillary: 80 mg/dL (ref 65–99)
Glucose-Capillary: 160 mg/dL — ABNORMAL HIGH (ref 65–99)

## 2017-06-14 LAB — PHOSPHORUS: PHOSPHORUS: 4.1 mg/dL (ref 2.5–4.6)

## 2017-06-14 LAB — AMMONIA: Ammonia: 46 umol/L — ABNORMAL HIGH (ref 9–35)

## 2017-06-14 LAB — MAGNESIUM: Magnesium: 2.2 mg/dL (ref 1.7–2.4)

## 2017-06-14 LAB — MRSA PCR SCREENING: MRSA BY PCR: NEGATIVE

## 2017-06-14 LAB — PREPARE RBC (CROSSMATCH)

## 2017-06-14 MED ORDER — SODIUM CHLORIDE 0.9% FLUSH
3.0000 mL | INTRAVENOUS | Status: DC | PRN
  Administered 2017-06-14 (×2): 3 mL via INTRAVENOUS
  Filled 2017-06-14 (×2): qty 3

## 2017-06-14 MED ORDER — INSULIN ASPART 100 UNIT/ML ~~LOC~~ SOLN: 0.0000 [IU] | Freq: Every day | SUBCUTANEOUS | Status: DC

## 2017-06-14 MED ORDER — ALBUTEROL SULFATE (2.5 MG/3ML) 0.083% IN NEBU: 2.5000 mg | INHALATION_SOLUTION | Freq: Four times a day (QID) | RESPIRATORY_TRACT | Status: DC | PRN

## 2017-06-14 MED ORDER — DICYCLOMINE HCL 20 MG PO TABS
20.0000 mg | ORAL_TABLET | Freq: Two times a day (BID) | ORAL | Status: DC
  Administered 2017-06-14 – 2017-06-15 (×4): 20 mg via ORAL
  Filled 2017-06-14 (×4): qty 1

## 2017-06-14 MED ORDER — LEVALBUTEROL HCL 0.63 MG/3ML IN NEBU: 0.6300 mg | INHALATION_SOLUTION | Freq: Four times a day (QID) | RESPIRATORY_TRACT | Status: DC | PRN

## 2017-06-14 MED ORDER — ONDANSETRON HCL 4 MG/2ML IJ SOLN: 4.0000 mg | Freq: Four times a day (QID) | INTRAMUSCULAR | Status: DC | PRN

## 2017-06-14 MED ORDER — ROPINIROLE HCL 1 MG PO TABS
4.0000 mg | ORAL_TABLET | Freq: Every day | ORAL | Status: DC
  Administered 2017-06-14: 4 mg via ORAL
  Filled 2017-06-14: qty 4

## 2017-06-14 MED ORDER — ALPRAZOLAM 1 MG PO TABS
1.0000 mg | ORAL_TABLET | Freq: Two times a day (BID) | ORAL | Status: DC | PRN
  Administered 2017-06-14 (×2): 1 mg via ORAL
  Filled 2017-06-14 (×2): qty 1

## 2017-06-14 MED ORDER — LACTULOSE 10 GM/15ML PO SOLN
20.0000 g | Freq: Three times a day (TID) | ORAL | Status: DC
  Administered 2017-06-14 – 2017-06-15 (×4): 20 g via ORAL
  Filled 2017-06-14 (×4): qty 30

## 2017-06-14 MED ORDER — DEXTROSE 5 % IV SOLN
1.0000 g | INTRAVENOUS | Status: DC
  Administered 2017-06-14: 1 g via INTRAVENOUS
  Filled 2017-06-14: qty 10

## 2017-06-14 MED ORDER — INSULIN ASPART 100 UNIT/ML ~~LOC~~ SOLN
0.0000 [IU] | Freq: Three times a day (TID) | SUBCUTANEOUS | Status: DC
  Administered 2017-06-15: 2 [IU] via SUBCUTANEOUS

## 2017-06-14 MED ORDER — PANTOPRAZOLE SODIUM 40 MG PO TBEC
40.0000 mg | DELAYED_RELEASE_TABLET | Freq: Two times a day (BID) | ORAL | Status: DC
  Administered 2017-06-14 – 2017-06-15 (×4): 40 mg via ORAL
  Filled 2017-06-14 (×4): qty 1

## 2017-06-14 MED ORDER — POTASSIUM CHLORIDE 10 MEQ/100ML IV SOLN
10.0000 meq | INTRAVENOUS | Status: AC
  Administered 2017-06-14 (×4): 10 meq via INTRAVENOUS
  Filled 2017-06-14 (×2): qty 100

## 2017-06-14 MED ORDER — TORSEMIDE 20 MG PO TABS
40.0000 mg | ORAL_TABLET | Freq: Every day | ORAL | Status: DC
  Administered 2017-06-14 – 2017-06-15 (×2): 40 mg via ORAL
  Filled 2017-06-14 (×2): qty 2

## 2017-06-14 MED ORDER — SODIUM CHLORIDE 0.9 % IV SOLN
Freq: Once | INTRAVENOUS | Status: AC
  Administered 2017-06-14: 03:00:00 via INTRAVENOUS

## 2017-06-14 MED ORDER — CEFTRIAXONE SODIUM 1 G IJ SOLR: 1.0000 g | Freq: Once | INTRAMUSCULAR | Status: DC

## 2017-06-14 MED ORDER — SODIUM CHLORIDE 0.9 % IV SOLN: 250.0000 mL | INTRAVENOUS | Status: DC | PRN

## 2017-06-14 MED ORDER — FERROUS GLUCONATE 324 (38 FE) MG PO TABS
324.0000 mg | ORAL_TABLET | Freq: Three times a day (TID) | ORAL | Status: DC
  Administered 2017-06-14 – 2017-06-15 (×5): 324 mg via ORAL
  Filled 2017-06-14 (×6): qty 1

## 2017-06-14 MED ORDER — BUPROPION HCL ER (XL) 150 MG PO TB24
150.0000 mg | ORAL_TABLET | Freq: Every day | ORAL | Status: DC
  Administered 2017-06-14 – 2017-06-15 (×2): 150 mg via ORAL
  Filled 2017-06-14 (×2): qty 1

## 2017-06-14 MED ORDER — ALPRAZOLAM 1 MG PO TABS
1.0000 mg | ORAL_TABLET | Freq: Every day | ORAL | Status: DC
  Administered 2017-06-14: 1 mg via ORAL
  Filled 2017-06-14: qty 1

## 2017-06-14 MED ORDER — QUETIAPINE FUMARATE 100 MG PO TABS
100.0000 mg | ORAL_TABLET | Freq: Every day | ORAL | Status: DC
  Administered 2017-06-14: 100 mg via ORAL
  Filled 2017-06-14: qty 1

## 2017-06-14 MED ORDER — SODIUM CHLORIDE 0.9% FLUSH: 3.0000 mL | Freq: Two times a day (BID) | INTRAVENOUS | Status: DC

## 2017-06-14 MED ORDER — ALPRAZOLAM 1 MG PO TABS
1.0000 mg | ORAL_TABLET | ORAL | Status: DC
  Administered 2017-06-14: 1 mg via ORAL
  Filled 2017-06-14: qty 2

## 2017-06-14 MED ORDER — RIFAXIMIN 550 MG PO TABS
550.0000 mg | ORAL_TABLET | Freq: Two times a day (BID) | ORAL | Status: DC
  Administered 2017-06-14 – 2017-06-15 (×4): 550 mg via ORAL
  Filled 2017-06-14 (×4): qty 1

## 2017-06-14 MED ORDER — ATORVASTATIN CALCIUM 20 MG PO TABS
20.0000 mg | ORAL_TABLET | Freq: Every day | ORAL | Status: DC
  Administered 2017-06-14: 20 mg via ORAL
  Filled 2017-06-14: qty 1

## 2017-06-14 MED ORDER — INSULIN GLARGINE 100 UNIT/ML ~~LOC~~ SOLN
45.0000 [IU] | Freq: Every morning | SUBCUTANEOUS | Status: DC
  Administered 2017-06-14 – 2017-06-15 (×2): 45 [IU] via SUBCUTANEOUS
  Filled 2017-06-14 (×2): qty 0.45

## 2017-06-14 MED ORDER — ONDANSETRON HCL 4 MG PO TABS: 4.0000 mg | ORAL_TABLET | Freq: Four times a day (QID) | ORAL | Status: DC | PRN

## 2017-06-14 NOTE — ED Notes (Addendum)
Call [name] for report [phone] at [phone].

## 2017-06-14 NOTE — Progress Notes (Signed)
PROGRESS NOTE  [name]  QIW:[id number] DOB: 13-Nov-1940 DOA: 06/13/2017 PCP: Garwin Brothers, MD  Outpatient Specialists: [facility], [place] Brief Narrative: [name] is a 76 y.o. female with a complicated medical history including GAVE with chronic GI bleeding and anemia requiring procrit and periodic transfusions as well as end-stage liver disease, cirrhosis and T2DM who presented from her nursing facility due to lethargy and lower than baseline hemoglobin level. She was sent to the ED for transfusion but was noted to have altered mentation as well. Work up confirmed worsened anemia, evidence of UTI, and lactulose was started for elevated ammonia.   Assessment & Plan: Active Problems:   Essential hypertension   Coronary atherosclerosis   Autoimmune hepatitis ([place])   Hepatic cirrhosis (HCC)   Gastrointestinal hemorrhage   Thrombocytopenia (HCC)   Liver cirrhosis secondary to NASH (HCC)   COPD with chronic bronchitis (HCC)   Chronic diastolic CHF (congestive heart failure) (HCC)   Diabetes (HCC)   Symptomatic anemia   Acute hepatic encephalopathy   Acute lower UTI   Hypokalemia  Acute encephalopathy: Unclear primary etiology, but likely attributable to hepatic encephalopathy from liver failuer, toxic/metabolic encephalopathy from UTI, and possible related directly to anemia/relative cerebral hypoxia. Confirmed with family that she is currently significantly more lethargic/less conversant than her baseline which is conversant, if a bit disoriented and occasionally delirious with a history of psychotic illness.  - Treating with lactulose, CTX for UTI pending cultures, and transfusion as below - Monitor closely, fall precautions, delirium precautions.   Acute blood loss anemia on chronic blood loss anemia due to GAVE: Long-standing history with persistent bleeding historically improved with RFA with little success from other approaches. Hgb 6.3 on arrival from baseline 8-9.  -  Transfuse 2u PRBCs. There was a delay in obtaining these. She just started first unit this morning. Will recheck CBC tmrw AM.  - Has follow up scheduled Tuesday and RFA scheduled Wednesday. Discussed with Dr. Ardis Hughs who recommends no change to that plan unless an acute bleed develops. Would recommend CBC recheck at that follow up visit.  - Pt's HCPOA states that this RFA is something of a last ditch effort prior to reinstating hospice care.   End-stage liver disease/cirrhosis:  - Monitor thrombocytopenia.  - Increase lactulose to get better output as above.   Nitrite positive pyuria: Unable to elicit sxs at this time. With AMS and neutropenia on arrival, treating empirically.  - Ceftriaxone pending urine culture results.  - Monitor blood cultures.   DVT prophylaxis: SCDs Code Status: DNR, confirmed with daughter who is HCPOA Family Communication: Daughter and Son at bedside Disposition Plan: Hopeful to see improvement in mentation to baseline in next 24-48 hours with discharge to nursing facility.   Consultants:   Gastroenterology, Dr. Ardis Hughs who reviewed the case personally and informed care.   Procedures:   2u PRBCs 10/20  Antimicrobials:  Ceftriaxone   Subjective: Pt lethargic, unable to stay awake for more than a second or two. Family at bedside state this is abnormal and has been worsening over the past couple days.   Objective: BP (!) 137/48   Pulse (!) 59   Temp 98 F (36.7 C) (Oral)   Resp 16   Ht 5\' 2"  (1.575 m)   Wt 76.4 kg (168 lb 6.9 oz)   SpO2 99%   BMI 30.81 kg/m   Gen: 76yo F appearing pale and older than stated age, edentulous.  Pulm: Non-labored breathing. Clear to auscultation bilaterally anterolaterally (  pt uncooperative with exam).  CV: Regular borderline bradycardia. No murmur, rub, or gallop. No JVD, no pedal edema. GI: Abdomen soft, non-tender, non-distended, with normoactive bowel sounds. No organomegaly or masses felt. Ext: Warm, no  deformities Skin: Focal fatty nodule on left lateral hip that may be tender (discrepant reports from patient) without overlying erythema/warmth most consistent with lipoma.  Neuro: Lethargic, poorly responsive. +asterixis. Psych: UTD  Data Reviewed: I have personally reviewed following labs and imaging studies  CBC:  Recent Labs Lab 06/13/17 1826 06/14/17 0546  WBC 3.0* 4.2  NEUTROABS 2.3  --   HGB 6.3* 6.9*  HCT 19.4* 21.5*  MCV 97.0 98.6  PLT 81* 84*   Basic Metabolic Panel:  Recent Labs Lab 06/13/17 1826 06/14/17 0546  NA 139 141  K 3.2* 3.7  CL 100* 105  CO2 28 31  GLUCOSE 140* 79  BUN 29* 26*  CREATININE 1.17* 1.06*  CALCIUM 8.4* 8.4*  MG 2.1 2.2  PHOS 4.0 4.1   GFR: Estimated Creatinine Clearance: 45.2 mL/min (A) (by C-G formula based on SCr of 1.06 mg/dL (H)). Liver Function Tests:  Recent Labs Lab 06/13/17 1826 06/14/17 0546  AST 48* 44*  ALT 35 38  ALKPHOS 134* 134*  BILITOT 1.2 0.9  PROT 4.7* 4.9*  ALBUMIN 2.5* 2.6*    Recent Labs Lab 06/13/17 1826  LIPASE 28    Recent Labs Lab 06/13/17 1826 06/14/17 0546  AMMONIA 86* 46*   Coagulation Profile:  Recent Labs Lab 06/13/17 1826  INR 1.11   Cardiac Enzymes: No results for input(s): CKTOTAL, CKMB, CKMBINDEX, TROPONINI in the last 168 hours. BNP (last 3 results) No results for input(s): PROBNP in the last 8760 hours. HbA1C: No results for input(s): HGBA1C in the last 72 hours. CBG:  Recent Labs Lab 06/14/17 0235 06/14/17 0727 06/14/17 1154 06/14/17 1714  GLUCAP 76 96 115* 80   Lipid Profile: No results for input(s): CHOL, HDL, LDLCALC, TRIG, CHOLHDL, LDLDIRECT in the last 72 hours. Thyroid Function Tests:  Recent Labs  06/14/17 0546  TSH 2.131   Anemia Panel: No results for input(s): VITAMINB12, FOLATE, FERRITIN, TIBC, IRON, RETICCTPCT in the last 72 hours. Urine analysis:    Component Value Date/Time   COLORURINE YELLOW 06/13/2017 1826   APPEARANCEUR CLEAR  06/13/2017 1826   LABSPEC 1.008 06/13/2017 1826   PHURINE 7.0 06/13/2017 1826   GLUCOSEU NEGATIVE 06/13/2017 1826   GLUCOSEU NEGATIVE 07/17/2015 0907   HGBUR NEGATIVE 06/13/2017 1826   BILIRUBINUR NEGATIVE 06/13/2017 1826   BILIRUBINUR negative 02/26/2016 1206   KETONESUR NEGATIVE 06/13/2017 1826   PROTEINUR NEGATIVE 06/13/2017 1826   UROBILINOGEN 4.0 02/26/2016 1206   UROBILINOGEN 0.2 07/17/2015 0907   NITRITE POSITIVE (A) 06/13/2017 1826   LEUKOCYTESUR TRACE (A) 06/13/2017 1826   Recent Results (from the past 240 hour(s))  MRSA PCR Screening     Status: None   Collection Time: 06/14/17  5:06 AM  Result Value Ref Range Status   MRSA by PCR NEGATIVE NEGATIVE Final    Comment:        The GeneXpert MRSA Assay (FDA approved for NASAL specimens only), is one component of a comprehensive MRSA colonization surveillance program. It is not intended to diagnose MRSA infection nor to guide or monitor treatment for MRSA infections.       Radiology Studies: Dg Chest 2 View  Result Date: 06/13/2017 CLINICAL DATA:  Shortness breath, chest pain EXAM: CHEST  2 VIEW COMPARISON:  05/30/2017 FINDINGS: The heart size and mediastinal  contours are within normal limits. Both lungs are clear. The visualized skeletal structures are unremarkable. IMPRESSION: No active cardiopulmonary disease. Electronically Signed   By: Kathreen Devoid   On: 06/13/2017 20:10    Scheduled Meds: . ALPRAZolam  1 mg Oral Daily  . atorvastatin  20 mg Oral q1800  . buPROPion  150 mg Oral Daily  . dicyclomine  20 mg Oral BID  . ferrous gluconate  324 mg Oral TID WC  . insulin aspart  0-5 Units Subcutaneous QHS  . insulin aspart  0-9 Units Subcutaneous TID WC  . insulin glargine  45 Units Subcutaneous q morning - 10a  . lactulose  20 g Oral TID  . pantoprazole  40 mg Oral BID  . QUEtiapine  100 mg Oral QHS  . rifaximin  550 mg Oral BID  . rOPINIRole  4 mg Oral QHS  . sodium chloride flush  3 mL Intravenous Q12H  .  torsemide  40 mg Oral Daily   Continuous Infusions: . sodium chloride    . cefTRIAXone (ROCEPHIN)  IV       LOS: 1 day   Time spent: 25 minutes.  Vance Gather, MD [facility] Pager [phone]  If 7PM-7AM, please contact night-coverage [email or web address] Password [email or web address] 06/14/2017, 6:02 PM

## 2017-06-15 DIAGNOSIS — D649 Anemia, unspecified: Secondary | ICD-10-CM | POA: Diagnosis not present

## 2017-06-15 DIAGNOSIS — I5032 Chronic diastolic (congestive) heart failure: Secondary | ICD-10-CM | POA: Diagnosis not present

## 2017-06-15 DIAGNOSIS — K7200 Acute and subacute hepatic failure without coma: Secondary | ICD-10-CM | POA: Diagnosis not present

## 2017-06-15 DIAGNOSIS — N3000 Acute cystitis without hematuria: Secondary | ICD-10-CM | POA: Diagnosis not present

## 2017-06-15 LAB — TYPE AND SCREEN
ABO/RH(D): O POS
ANTIBODY SCREEN: NEGATIVE
UNIT DIVISION: 0
Unit division: 0

## 2017-06-15 LAB — BPAM RBC
BLOOD PRODUCT EXPIRATION DATE: 201811132359
BLOOD PRODUCT EXPIRATION DATE: 201811182359
ISSUE DATE / TIME: 201810201241
ISSUE DATE / TIME: 201810201656
UNIT TYPE AND RH: 5100
Unit Type and Rh: 5100

## 2017-06-15 LAB — CBC
HEMATOCRIT: 24.1 % — AB (ref 36.0–46.0)
Hemoglobin: 7.9 g/dL — ABNORMAL LOW (ref 12.0–15.0)
MCH: 31.1 pg (ref 26.0–34.0)
MCHC: 32.8 g/dL (ref 30.0–36.0)
MCV: 94.9 fL (ref 78.0–100.0)
Platelets: 72 10*3/uL — ABNORMAL LOW (ref 150–400)
RBC: 2.54 MIL/uL — ABNORMAL LOW (ref 3.87–5.11)
RDW: 19.5 % — AB (ref 11.5–15.5)
WBC: 3.1 10*3/uL — ABNORMAL LOW (ref 4.0–10.5)

## 2017-06-15 LAB — GLUCOSE, CAPILLARY
GLUCOSE-CAPILLARY: 98 mg/dL (ref 65–99)
GLUCOSE-CAPILLARY: 157 mg/dL — AB (ref 65–99)

## 2017-06-15 MED ORDER — SULFAMETHOXAZOLE-TRIMETHOPRIM 800-160 MG PO TABS: 1.0000 | ORAL_TABLET | Freq: Two times a day (BID) | ORAL | 0 refills | Status: DC

## 2017-06-15 MED ORDER — LACTULOSE 20 GM/30ML PO SOLN: 20.0000 g | Freq: Three times a day (TID) | ORAL | Status: DC

## 2017-06-15 MED ORDER — OXYCODONE HCL 5 MG PO TABS: 5.0000 mg | ORAL_TABLET | Freq: Four times a day (QID) | ORAL | 0 refills | Status: DC | PRN

## 2017-06-15 MED ORDER — SULFAMETHOXAZOLE-TRIMETHOPRIM 800-160 MG PO TABS
1.0000 | ORAL_TABLET | Freq: Two times a day (BID) | ORAL | Status: DC
  Administered 2017-06-15: 1 via ORAL
  Filled 2017-06-15: qty 1

## 2017-06-15 MED ORDER — TRAMADOL HCL 50 MG PO TABS: 50.0000 mg | ORAL_TABLET | Freq: Two times a day (BID) | ORAL | 0 refills | Status: DC

## 2017-06-15 MED ORDER — ALPRAZOLAM 1 MG PO TABS: 0.5000 mg | ORAL_TABLET | Freq: Two times a day (BID) | ORAL | 0 refills | Status: DC | PRN

## 2017-06-15 NOTE — Progress Notes (Signed)
Report called to Amy, RN at [facility].  All questions answered.  Pt transported via [facility] with belongings and paperwork.

## 2017-06-15 NOTE — Clinical Social Work Note (Signed)
SW left another voicemail for [facility] to facilitate discharge back to facility.  Dede Query, LCSW [place] Worker - Weekend Coverage cell #: [phone]

## 2017-06-15 NOTE — Care Management CC44 (Signed)
Condition Code 44 Documentation Completed  Patient Details  Name: [name] MRN: [id number] Date of Birth: [birth year]   Condition Code 44 given:  Yes Patient signature on Condition Code 44 notice:  Yes Documentation of 2 MD's agreement:  Yes Code 44 added to claim:  Yes    Erenest Rasher, RN 06/15/2017, 11:36 AM

## 2017-06-15 NOTE — Care Management Obs Status (Signed)
[place] NOTIFICATION   Patient Details  Name: [name] MRN: [id number] Date of Birth: Aug 30, 1940   Medicare Observation Status Notification Given:  Yes    Erenest Rasher, RN 06/15/2017, 11:36 AM

## 2017-06-15 NOTE — Clinical Social Work Note (Signed)
[facility] called for patient transport back to [facility]. RN provided with packet.  Dede Query, LCSW [place] Worker - Weekend Coverage cell #: [phone]

## 2017-06-15 NOTE — Discharge Summary (Signed)
Physician Discharge Summary  SHERRILL BUIKEMA GMW:[id number] DOB: May 12, 1941 DOA: 06/13/2017  PCP: Garwin Brothers, MD  Admit date: 06/13/2017 Discharge date: 06/15/2017  Admitted From: NH  Disposition: NH   Recommendations for Outpatient Follow-up:  1. Follow up with GI 10/23 with recheck CBC, then 10/24 as scheduled for RFA. 2. Continue bactrim for empiric UTI Tx 3. Continue lactulose TID  Home Health: N/A Equipment/Devices: None new Discharge Condition: Stable CODE STATUS: DNR Diet recommendation: carb-modified  Brief/Interim Summary: [name] is a 76 y.o. female with a complicated medical history including GAVE with chronic GI bleeding and anemia requiring procrit and periodic transfusions as well as end-stage liver disease, cirrhosis and T2DM who presented from her nursing facility due to lethargy and lower than baseline hemoglobin level. She was sent to the ED for transfusion but was noted to have altered mentation as well. Work up confirmed worsened anemia, evidence of UTI, and lactulose was started for elevated ammonia. Her mental status has improved and hemoglobin rose to 7.9 from 6.3 with 2u PRBCs. She is stable hemodynamically and discharged with follow up as already scheduled in 48 hours.   Discharge Diagnoses:  Active Problems:   Essential hypertension   Coronary atherosclerosis   Autoimmune hepatitis ([place])   Hepatic cirrhosis (HCC)   Gastrointestinal hemorrhage   Thrombocytopenia (HCC)   Liver cirrhosis secondary to NASH (HCC)   COPD with chronic bronchitis (HCC)   Chronic diastolic CHF (congestive heart failure) (HCC)   Diabetes (HCC)   Symptomatic anemia   Acute hepatic encephalopathy   Acute lower UTI   Hypokalemia  Acute encephalopathy, improved: Unclear primary etiology, but likely attributable to hepatic encephalopathy from liver failuer, toxic/metabolic encephalopathy from UTI, and possible related directly to anemia/relative cerebral hypoxia. Confirmed  with family that she is currently significantly more lethargic/less conversant than her baseline which is conversant, if a bit disoriented and occasionally delirious with a history of psychotic illness.  - Improved with lactulose TID, CTX for UTI pending cultures (will switch to bactrim based on UTI history), and transfusion. - Monitor closely, fall precautions, delirium precautions.   Acute blood loss anemia on chronic blood loss anemia due to GAVE: Long-standing history with persistent bleeding historically improved with RFA with little success from other approaches. Hgb 6.3 on arrival up to 7.9 with 2u PRBCs.  - Recommend recheck CBC at follow up. Has follow up scheduled Tuesday and RFA scheduled Wednesday. Discussed with Dr. Ardis Hughs who recommends no change to that plan unless an acute bleed develops (it has not).  - Pt's HCPOA states that this RFA is something of a last ditch effort prior to reinstating hospice care.  - Continue po iron  End-stage liver disease/cirrhosis:  - Monitor thrombocytopenia, stable.  - Continue xifaxin and increase lactulose to get better output as above.  - Continue diuretics  Nitrite positive pyuria: Unable to elicit sxs at this time. With AMS and neutropenia on arrival, treating empirically for UTI.  - Ceftriaxone > bactrim (last dose of 3 days is 10/21 PM) as outpatient based on urine cultures (E. coli x4 in 2016 and citrobacter x1 all sensitive to bactrim) pending urine culture results.  - Monitor blood cultures (NGTD).   Chronic hypoxic respiratory failure/COPD:  - Continue oxygen and bronchodilators.  IDDM:  - Continue home medications  Depression, anxiety, bipolar disorder:  - Due to altered mentation, would recommend discontinuing standing benzodiazepine dose and continue with BID prn as below. - Continue wellbutrin, seroquel, requip, valpro  Discharge Instructions  Allergies as of 06/15/2017      Reactions   Acetaminophen Other (See Comments)    Liver dysfunction   Nsaids Other (See Comments)   Liver dysfunction    Aspirin Other (See Comments)   Causes nosebleeds   Theophylline Nausea And Vomiting   Penicillin G Itching   Penicillins Itching, Other (See Comments)   Has patient had a PCN reaction causing immediate rash, facial/tongue/throat swelling, SOB or lightheadedness with hypotension: unknown Has patient had a PCN reaction causing severe rash involving mucus membranes or skin necrosis: unknown Has patient had a PCN reaction that required hospitalization unknown Has patient had a PCN reaction occurring within the last 10 years: yes If all of the above answers are "NO", then may proceed with Cephalosporin use.      Medication List    STOP taking these medications   fexofenadine-pseudoephedrine 180-240 MG 24 hr tablet Commonly known as:  ALLEGRA-D 24     TAKE these medications   ADVAIR DISKUS 250-50 MCG/DOSE Aepb Generic drug:  Fluticasone-Salmeterol INHALE 1 PUFF INTO LUNGS TWICE A DAY   albuterol (2.5 MG/3ML) 0.083% nebulizer solution Commonly known as:  PROVENTIL Take 2.5 mg by nebulization every 6 (six) hours as needed for wheezing or shortness of breath.   PROAIR HFA 108 (90 Base) MCG/ACT inhaler Generic drug:  albuterol Take 1-2 puffs by mouth every 6 (six) hours as needed for wheezing.   ALPRAZolam 1 MG tablet Commonly known as:  XANAX Take 0.5-1 tablets (0.5-1 mg total) by mouth 2 (two) times daily as needed for anxiety or sleep. What changed:  how much to take  how to take this  when to take this  reasons to take this  additional instructions   atorvastatin 20 MG tablet Commonly known as:  LIPITOR Take 20 mg by mouth daily.   buPROPion 150 MG 24 hr tablet Commonly known as:  WELLBUTRIN XL Take 150 mg by mouth daily.   DEPAKENE 250 MG capsule Generic drug:  valproic acid Take 250 mg by mouth 2 (two) times daily.   dicyclomine 20 MG tablet Commonly known as:  BENTYL Take 1 tablet  (20 mg total) by mouth 2 (two) times daily.   ferrous gluconate 324 MG tablet Commonly known as:  FERGON Take 324 mg by mouth 3 (three) times daily with meals.   glucose blood test strip Commonly known as:  ONE TOUCH ULTRA TEST Check blood sugar 4 times daily   HUMALOG KWIKPEN 100 UNIT/ML KiwkPen Generic drug:  insulin lispro Inject 0-10 Units into the skin 4 (four) times daily -  before meals and at bedtime. 0-150=0 151-200=2 201-250=4 251-300=6 301-350=8 351-400=10   Insulin Pen Needle 31G X 8 MM Misc Commonly known as:  B-D ULTRAFINE III SHORT PEN Use as directed to inject insulin twice daily. Dx: E11.8   ipratropium-albuterol 0.5-2.5 (3) MG/3ML Soln Commonly known as:  DUONEB Take 3 mLs by nebulization 2 (two) times daily.   isosorbide mononitrate 30 MG 24 hr tablet Commonly known as:  IMDUR Take 1 tablet (30 mg total) by mouth daily.   Lactulose 20 GM/30ML Soln Take 30 mLs (20 g total) by mouth 3 (three) times daily. hold one dose for diarrhea What changed:  when to take this  additional instructions   LANTUS SOLOSTAR 100 UNIT/ML Solostar Pen Generic drug:  Insulin Glargine Inject 60 Units into the skin every morning.   levalbuterol 0.63 MG/3ML nebulizer solution Commonly known as:  XOPENEX Take 0.63 mg by nebulization 2 (  two) times daily.   metolazone 2.5 MG tablet Commonly known as:  ZAROXOLYN Take 2.5 mg by mouth See admin instructions. Take 2.5 mg by mouth daily. Take 2.5 mg by mouth daily as needed for weight gain 4 lbs in 24 hours   multivitamin with minerals Tabs tablet Take 1 tablet by mouth daily.   nitroGLYCERIN 0.4 MG SL tablet Commonly known as:  NITROSTAT Place 1 tablet (0.4 mg total) under the tongue every 5 (five) minutes as needed for chest pain. No more than 3 in 15 minutes   ONETOUCH DELICA LANCETS 29H Misc USE TO CHECK SUGAR 4 TIMES DAILY AND AS NEEDED. BZ:J69.67 **ONE TOUCH DELICA**   oxyCODONE 5 MG immediate release tablet Commonly  known as:  Oxy IR/ROXICODONE Take 1 tablet (5 mg total) by mouth every 6 (six) hours as needed for moderate pain or severe pain.   OXYGEN Inhale 2 L into the lungs continuous.   pantoprazole 40 MG tablet Commonly known as:  PROTONIX TAKE 1 TABLET BY MOUTH TWICE A DAY What changed:  See the new instructions.   polyethylene glycol packet Commonly known as:  MIRALAX Take 17 g by mouth daily.   promethazine 25 MG tablet Commonly known as:  PHENERGAN Take 25 mg by mouth 2 (two) times daily as needed for nausea or vomiting.   QUEtiapine 100 MG tablet Commonly known as:  SEROQUEL Take 100 mg by mouth at bedtime.   rifaximin 550 MG Tabs tablet Commonly known as:  XIFAXAN Take 550 mg by mouth 2 (two) times daily.   rOPINIRole 4 MG tablet Commonly known as:  REQUIP TAKE 1 TABLET BY MOUTH AT BEDTIME What changed:  See the new instructions.   sulfamethoxazole-trimethoprim 800-160 MG tablet Commonly known as:  BACTRIM DS,SEPTRA DS Take 1 tablet by mouth every 12 (twelve) hours.   SYSTANE BALANCE 0.6 % Soln Generic drug:  Propylene Glycol Place 1 drop into both eyes every morning.   torsemide 20 MG tablet Commonly known as:  DEMADEX Take 40 mg by mouth daily.   traMADol 50 MG tablet Commonly known as:  ULTRAM Take 1 tablet (50 mg total) by mouth 2 (two) times daily. Not to be given with Xanax.      Follow-up Information    Garwin Brothers, MD Follow up.   Specialty:  Internal Medicine Contact information: [place] [address] [phone]        Yetta Flock, MD Follow up on 06/17/2017.   Specialty:  Gastroenterology Why:  at 4:00pm Contact information: [name] [place] [address] [phone]          Allergies  Allergen Reactions  . Acetaminophen Other (See Comments)    Liver dysfunction  . Nsaids Other (See Comments)    Liver dysfunction   . Aspirin Other (See Comments)    Causes nosebleeds  . Theophylline  Nausea And Vomiting  . Penicillin G Itching  . Penicillins Itching and Other (See Comments)    Has patient had a PCN reaction causing immediate rash, facial/tongue/throat swelling, SOB or lightheadedness with hypotension: unknown Has patient had a PCN reaction causing severe rash involving mucus membranes or skin necrosis: unknown Has patient had a PCN reaction that required hospitalization unknown Has patient had a PCN reaction occurring within the last 10 years: yes If all of the above answers are "NO", then may proceed with Cephalosporin use.     Consultations:  GI, Dr. Ardis Hughs  Procedures/Studies:  Dg Chest 2 View  Result Date: 06/13/2017 CLINICAL DATA:  Shortness breath, chest pain EXAM: CHEST  2 VIEW COMPARISON:  05/30/2017 FINDINGS: The heart size and mediastinal contours are within normal limits. Both lungs are clear. The visualized skeletal structures are unremarkable. IMPRESSION: No active cardiopulmonary disease. Electronically Signed   By: Kathreen Devoid   On: 06/13/2017 20:10   Dg Chest 2 View  Result Date: 05/30/2017 CLINICAL DATA:  Initial evaluation for acute shortness of breath. EXAM: CHEST  2 VIEW COMPARISON:  Prior radiograph from 07/08/2016. FINDINGS: Mild cardiomegaly. Mediastinal silhouette normal. Aortic atherosclerosis. Lungs hypoinflated. Streaky left perihilar and basilar atelectasis and/ or scarring. No focal infiltrates. No pulmonary edema or pleural effusion. No pneumothorax. No acute osseus abnormality. IMPRESSION: 1. Mild left perihilar and basilar atelectasis and/or scarring. 2. No other active cardiopulmonary disease. 3. Mild cardiomegaly. 4. Aortic atherosclerosis. Electronically Signed   By: Jeannine Boga M.D.   On: 05/30/2017 16:25   Subjective: More alert, interactive this morning. Feeding self dinner.  Discharge Exam: Vitals:   06/14/17 2030 06/15/17 0533  BP: (!) 141/42 (!) 140/50  Pulse: (!) 50 (!) 55  Resp: 16 18  Temp: 98.8 F (37.1 C)  98 F (36.7 C)  SpO2: 100% 100%   General: Pt is alert, in no distress, edentulous Cardiovascular: Regular bradycardia in mid 50's, no murmur or JVD, trace dependent edema Respiratory: Nonlabored, clear Abdominal: Soft, NT, ND, bowel sounds +  Labs: BNP (last 3 results)  Recent Labs  06/13/17 1826  BNP 41.9   Basic Metabolic Panel:  Recent Labs Lab 06/13/17 1826 06/14/17 0546  NA 139 141  K 3.2* 3.7  CL 100* 105  CO2 28 31  GLUCOSE 140* 79  BUN 29* 26*  CREATININE 1.17* 1.06*  CALCIUM 8.4* 8.4*  MG 2.1 2.2  PHOS 4.0 4.1   Liver Function Tests:  Recent Labs Lab 06/13/17 1826 06/14/17 0546  AST 48* 44*  ALT 35 38  ALKPHOS 134* 134*  BILITOT 1.2 0.9  PROT 4.7* 4.9*  ALBUMIN 2.5* 2.6*    Recent Labs Lab 06/13/17 1826  LIPASE 28    Recent Labs Lab 06/13/17 1826 06/14/17 0546  AMMONIA 86* 46*   CBC:  Recent Labs Lab 06/13/17 1826 06/14/17 0546 06/15/17 0602  WBC 3.0* 4.2 3.1*  NEUTROABS 2.3  --   --   HGB 6.3* 6.9* 7.9*  HCT 19.4* 21.5* 24.1*  MCV 97.0 98.6 94.9  PLT 81* 84* 72*   Cardiac Enzymes: No results for input(s): CKTOTAL, CKMB, CKMBINDEX, TROPONINI in the last 168 hours. BNP: Invalid input(s): POCBNP CBG:  Recent Labs Lab 06/14/17 0727 06/14/17 1154 06/14/17 1714 06/14/17 2106 06/15/17 0737  GLUCAP 96 115* 80 160* 98   D-Dimer No results for input(s): DDIMER in the last 72 hours. Hgb A1c No results for input(s): HGBA1C in the last 72 hours. Lipid Profile No results for input(s): CHOL, HDL, LDLCALC, TRIG, CHOLHDL, LDLDIRECT in the last 72 hours. Thyroid function studies  Recent Labs  06/14/17 0546  TSH 2.131   Anemia work up No results for input(s): VITAMINB12, FOLATE, FERRITIN, TIBC, IRON, RETICCTPCT in the last 72 hours. Urinalysis    Component Value Date/Time   COLORURINE YELLOW 06/13/2017 1826   APPEARANCEUR CLEAR 06/13/2017 1826   LABSPEC 1.008 06/13/2017 1826   PHURINE 7.0 06/13/2017 1826   GLUCOSEU  NEGATIVE 06/13/2017 1826   GLUCOSEU NEGATIVE 07/17/2015 0907   HGBUR NEGATIVE 06/13/2017 1826   BILIRUBINUR NEGATIVE 06/13/2017 1826  BILIRUBINUR negative 02/26/2016 1206   [place] 06/13/2017 1826   PROTEINUR NEGATIVE 06/13/2017 1826   UROBILINOGEN 4.0 02/26/2016 1206   UROBILINOGEN 0.2 07/17/2015 0907   NITRITE POSITIVE (A) 06/13/2017 1826   LEUKOCYTESUR TRACE (A) 06/13/2017 1826    Microbiology Recent Results (from the past 240 hour(s))  MRSA PCR Screening     Status: None   Collection Time: 06/14/17  5:06 AM  Result Value Ref Range Status   MRSA by PCR NEGATIVE NEGATIVE Final    Comment:        The GeneXpert MRSA Assay (FDA approved for NASAL specimens only), is one component of a comprehensive MRSA colonization surveillance program. It is not intended to diagnose MRSA infection nor to guide or monitor treatment for MRSA infections.     Time coordinating discharge: Approximately 40 minutes  Vance Gather, MD  [facility] 06/15/2017, 9:18 AM Pager [phone]

## 2017-06-15 NOTE — Clinical Social Work Note (Signed)
SW left message for Santiago Glad at [facility] to facilitate discharge back to facility.  Discharge paperwork submitted through the [place].   Dede Query, LCSW [place] Worker - Weekend Coverage cell #: [phone]

## 2017-06-16 LAB — URINE CULTURE

## 2017-06-17 ENCOUNTER — Encounter: Payer: Self-pay | Admitting: Gastroenterology

## 2017-06-17 ENCOUNTER — Ambulatory Visit (INDEPENDENT_AMBULATORY_CARE_PROVIDER_SITE_OTHER): Payer: Medicare Other | Admitting: Gastroenterology

## 2017-06-17 VITALS — BP 134/72 | HR 70 | Ht 62.0 in | Wt 171.0 lb

## 2017-06-17 DIAGNOSIS — D649 Anemia, unspecified: Secondary | ICD-10-CM

## 2017-06-17 DIAGNOSIS — K31819 Angiodysplasia of stomach and duodenum without bleeding: Secondary | ICD-10-CM | POA: Diagnosis not present

## 2017-06-17 DIAGNOSIS — K7460 Unspecified cirrhosis of liver: Secondary | ICD-10-CM

## 2017-06-17 NOTE — Patient Instructions (Addendum)
You have been given instructions for your Endoscopy procedure scheduled for tomorrow, 06-18-17 at [facility].  Please follow them closely.  Thank you.

## 2017-06-17 NOTE — Progress Notes (Signed)
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 She had this done in February 2017. It worked to keep her from having a transfusion for a while. Since our last visit patient has had recurrent anemia with need for multiple transfusions over the past several months. She has had a recent hospitalization for worsening anemia leading to another blood transfusion and for treatment of UTI this past weekend. I had spoken with her family in recent weeks about how aggressive she wished to be with her care. She had briefly enrolled in hospice however the patient was "not ready for this" and was willing to try another endoscopy for RFA ablation given this worked for her previously to reduce transfusion requirements.  She reports she is taking iron TID, tolerating it, her stools are chronically dark. She has some mid to upper abdominal pain at times which comes and goes. She is taking protonix 40mg  BID. She states her breathing is at baseline and okay. She has chronic fatigue from her anemia. No chest pains or shortness of breath. She completed 3 day course of antibiotics recently for UTI. She is taking lactulose / rifaximin for encephalopathy, daughter present states for the most part she is doing okay in regards to her thinking. She is moving to a new long term care facility tomorrow.   Most recent workup: MRCP done 10/01/2016 - advanced cirrhosis, suggestive for presence of esophageal varices, stable findings of the pancreas compared to prior imaging on 9/28 with dilated PD and atrophy of the tail but no worrisome lesions of the pancreas or liver.  EGD 09/29/15 - no esophageal varices EUS  2/317 for abnormal pancreas imaging. 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  She's also had a history of dilated PD on MRCP.     Past Medical History:  Diagnosis Date  . Adenomatous colon polyp   . Anxiety   . Asthma   . Back pain   . Benign neoplasm of colon   . CAP (community acquired pneumonia) 2015  . Carpal tunnel syndrome on both sides   . Chronic airway obstruction, not elsewhere classified   . Chronic diastolic CHF (congestive heart failure) ([place])    a. 03/26/2014 Echo: EF 55-60%, no rwma, mild AS/AI, mod-sev Ca2+ MV annulus, mildly to mod dil LA.  [name] Coarse tremors    a. arms.  . Diverticulosis of colon (without mention of hemorrhage)   . Esophageal reflux   . Falls frequently    completed HHPT/OT 06/2014, PT unmet goals  . GAVE (gastric antral vascular ectasia)    angiodysplasia; s/p multiple APCs, discussing RFA with [facility] Dr Newman Pies (06/2015)  . H/O hiatal hernia   . Hepatitis   . Hiatal hernia   . Hyperlipidemia   . Iron deficiency anemia secondary to blood loss (chronic)    a. frequent PRBC transfusions.  . Liver cirrhosis secondary to nonalcoholic steatohepatitis (NASH) ([place])    a. dx'd 1990's  . Major depressive disorder, recurrent episode, severe, specified as with psychotic behavior   . Memory loss   . Midsternal chest pain    a. conservatively managed ->poor candidate for cath/anticoagulation.  . Mild aortic stenosis    a. 03/2014 Valve area (VTI): 2.89 cm^2, Valve  area (Vmax): 2.7 cm^2.  . Morbid obesity ([place])   . Neck pain   . Non-obstructive CAD   . On home oxygen therapy    "3L; 24/7" (11/29/2015)  . OSA (obstructive sleep apnea)    a. does not use CPAP. (01/25/2015)  . Osteoarthrosis, unspecified whether generalized or localized, unspecified site   . Osteoporosis, unspecified   . Palpitations   . Portal hypertensive gastropathy ([place])   . Protein calorie malnutrition ([place])   . Pure hypercholesterolemia   . Recurrent UTI (urinary tract infection)    h/o hospitalization with urosepsis 2015, but thought large component  colonization/bacteriuria, only treat if symptomatic ([place])  . Restless legs syndrome (RLS)   . Schizophrenia ([place])   . Thrombocytopenia ([place])   . Type II diabetes mellitus ([place])   . Unspecified chronic bronchitis ([place])   . Unspecified essential hypertension      Past Surgical History:  Procedure Laterality Date  . APPENDECTOMY    . APPENDECTOMY    . BALLOON DILATION  06/12/2012   Procedure: BALLOON DILATION;  Surgeon: Lafayette Dragon, MD;  Location: [facility] ENDOSCOPY;  Service: Endoscopy;  Laterality: N/A;  ?balloon  . BREAST BIOPSY     bilateral  . CESAREAN SECTION     x 3  . ENTEROSCOPY N/A 02/25/2015   Procedure: ENTEROSCOPY;  Surgeon: [name] Ada, MD;  Location: [facility] ENDOSCOPY;  Service: Endoscopy;  Laterality: N/A;  . ENTEROSCOPY N/A 06/27/2015   Procedure: ENTEROSCOPY;  Surgeon: Manus Gunning, MD;  Location: [facility] ENDOSCOPY;  Service: Gastroenterology;  Laterality: N/A;  . ESOPHAGOGASTRODUODENOSCOPY  06/12/2012   Procedure: ESOPHAGOGASTRODUODENOSCOPY (EGD);  Surgeon: Lafayette Dragon, MD;  Location: [facility];  Service: Endoscopy;  Laterality: N/A;  . ESOPHAGOGASTRODUODENOSCOPY  09/10/2012   Procedure: ESOPHAGOGASTRODUODENOSCOPY (EGD);  Surgeon: Lafayette Dragon, MD;  Location: [facility];  Service: Endoscopy;  Laterality: N/A;  . ESOPHAGOGASTRODUODENOSCOPY N/A 01/18/2013   Procedure: ESOPHAGOGASTRODUODENOSCOPY (EGD);  Surgeon: Lafayette Dragon, MD;  Location: [facility] ENDOSCOPY;  Service: Endoscopy;  Laterality: N/A;  . ESOPHAGOGASTRODUODENOSCOPY N/A 05/24/2013   Procedure: ESOPHAGOGASTRODUODENOSCOPY (EGD);  Surgeon: Jerene Bears, MD;  Location: [facility];  Service: Gastroenterology;  Laterality: N/A;  . ESOPHAGOGASTRODUODENOSCOPY N/A 10/20/2014   Procedure: ESOPHAGOGASTRODUODENOSCOPY (EGD);  Surgeon: Lafayette Dragon, MD;  Location: [facility];  Service: Endoscopy;  Laterality: N/A;  . EUS  09/2015   antral CAVL, ablated; nodular erosive gastritis; mult pancreatic cysts rec rpt CT pancreas  protocol or MRI to survey pancreas cysts 1-2 yrs (Dr Amie Critchley)  . HEMORROIDECTOMY    . HOT HEMOSTASIS  09/10/2012   Procedure: HOT HEMOSTASIS (ARGON PLASMA COAGULATION/BICAP);  Surgeon: Lafayette Dragon, MD;  Location: [facility];  Service: Endoscopy;  Laterality: N/A;  . HOT HEMOSTASIS N/A 05/24/2013   Procedure: HOT HEMOSTASIS (ARGON PLASMA COAGULATION/BICAP);  Surgeon: Jerene Bears, MD;  Location: [facility];  Service: Gastroenterology;  Laterality: N/A;  . HOT HEMOSTASIS N/A 10/20/2014   Procedure: HOT HEMOSTASIS (ARGON PLASMA COAGULATION/BICAP);  Surgeon: Lafayette Dragon, MD;  Location: [facility];  Service: Endoscopy;  Laterality: N/A;  . US ECHOCARDIOGRAPHY  07/2014   mild LVH, EF 60-65%, normal wall motion, mild AR, mod dilated LA, mildly dilated RA, peak PA pressure 74mmHg  . VAGINAL HYSTERECTOMY     Family History  Problem Relation Age of Onset  . Heart disease Mother   . Cervical cancer Mother   . Kidney disease Mother   . Diabetes Mother   . Heart attack Mother   .  Diabetes Father   . Heart attack Father   . Multiple sclerosis Other   . Breast cancer Sister   . Multiple sclerosis Sister   . Breast cancer Maternal Aunt        x 2  . Diabetes Sister   . Stroke Daughter   . Stroke Daughter   . Lupus Daughter   . Colon cancer Neg Hx    Social History  Substance Use Topics  . Smoking status: Former Smoker    Packs/day: 1.50    Years: 25.00    Types: Cigarettes    Quit date: 08/26/1992  . Smokeless tobacco: Never Used  . Alcohol use No     Comment: 0/93/23 "last alcoholic drink was years ago"   Current Outpatient Prescriptions  Medication Sig Dispense Refill  . ADVAIR DISKUS 250-50 MCG/DOSE AEPB INHALE 1 PUFF INTO LUNGS TWICE A DAY 60 each 11  . albuterol (PROVENTIL) (2.5 MG/3ML) 0.083% nebulizer solution Take 2.5 mg by nebulization every 6 (six) hours as needed for wheezing or shortness of breath.    . ALPRAZolam (XANAX) 1 MG tablet Take 0.5-1 tablets (0.5-1 mg  total) by mouth 2 (two) times daily as needed for anxiety or sleep. 6 tablet 0  . atorvastatin (LIPITOR) 20 MG tablet Take 20 mg by mouth daily.     [name] buPROPion (WELLBUTRIN XL) 150 MG 24 hr tablet Take 150 mg by mouth daily.    [name] dicyclomine (BENTYL) 20 MG tablet Take 1 tablet (20 mg total) by mouth 2 (two) times daily. 20 tablet 0  . ferrous gluconate (FERGON) 324 MG tablet Take 324 mg by mouth 3 (three) times daily with meals.    [name] glucose blood (ONE TOUCH ULTRA TEST) test strip Check blood sugar 4 times daily 200 each 3  . Insulin Glargine (LANTUS SOLOSTAR) 100 UNIT/ML Solostar Pen Inject 60 Units into the skin every morning.    . insulin lispro (HUMALOG KWIKPEN) 100 UNIT/ML KiwkPen Inject 0-10 Units into the skin 4 (four) times daily -  before meals and at bedtime. 0-150=0 151-200=2 201-250=4 251-300=6 301-350=8 351-400=10    . Insulin Pen Needle (B-D ULTRAFINE III SHORT PEN) 31G X 8 MM MISC Use as directed to inject insulin twice daily. Dx: E11.8 200 each 2  . ipratropium-albuterol (DUONEB) 0.5-2.5 (3) MG/3ML SOLN Take 3 mLs by nebulization 2 (two) times daily. 180 mL 5  . isosorbide mononitrate (IMDUR) 30 MG 24 hr tablet Take 1 tablet (30 mg total) by mouth daily. 30 tablet 6  . Lactulose 20 GM/30ML SOLN Take 30 mLs (20 g total) by mouth 3 (three) times daily. hold one dose for diarrhea 30 mL   . levalbuterol (XOPENEX) 0.63 MG/3ML nebulizer solution Take 0.63 mg by nebulization 2 (two) times daily.   5  . metolazone (ZAROXOLYN) 2.5 MG tablet Take 2.5 mg by mouth See admin instructions. Take 2.5 mg by mouth daily. Take 2.5 mg by mouth daily as needed for weight gain 4 lbs in 24 hours    . Multiple Vitamin (MULTIVITAMIN WITH MINERALS) TABS tablet Take 1 tablet by mouth daily.     . nitroGLYCERIN (NITROSTAT) 0.4 MG SL tablet Place 1 tablet (0.4 mg total) under the tongue every 5 (five) minutes as needed for chest pain. No more than 3 in 15 minutes 25 tablet 1  . ONETOUCH DELICA LANCETS 55D  MISC USE TO CHECK SUGAR 4 TIMES DAILY AND AS NEEDED. DU:K02.54 **ONE TOUCH DELICA** 270 each 1  . oxyCODONE (OXY  IR/ROXICODONE) 5 MG immediate release tablet Take 1 tablet (5 mg total) by mouth every 6 (six) hours as needed for moderate pain or severe pain. 10 tablet 0  . OXYGEN Inhale 2 L into the lungs continuous.     . pantoprazole (PROTONIX) 40 MG tablet TAKE 1 TABLET BY MOUTH TWICE A DAY (Patient taking differently: Take 20 mg by mouth twice daily) 60 tablet 11  . polyethylene glycol (MIRALAX) packet Take 17 g by mouth daily. 2 each 0  . PROAIR HFA 108 (90 Base) MCG/ACT inhaler Take 1-2 puffs by mouth every 6 (six) hours as needed for wheezing.    . promethazine (PHENERGAN) 25 MG tablet Take 25 mg by mouth 2 (two) times daily as needed for nausea or vomiting.    [name] Propylene Glycol (SYSTANE BALANCE) 0.6 % SOLN Place 1 drop into both eyes every morning.    [name] QUEtiapine (SEROQUEL) 100 MG tablet Take 100 mg by mouth at bedtime.    . rifaximin (XIFAXAN) 550 MG TABS tablet Take 550 mg by mouth 2 (two) times daily.    [name] rOPINIRole (REQUIP) 4 MG tablet TAKE 1 TABLET BY MOUTH AT BEDTIME (Patient taking differently: TAKE 4 MG BY MOUTH AT BEDTIME) 30 tablet 6  . sulfamethoxazole-trimethoprim (BACTRIM DS,SEPTRA DS) 800-160 MG tablet Take 1 tablet by mouth every 12 (twelve) hours. 1 tablet 0  . torsemide (DEMADEX) 20 MG tablet Take 40 mg by mouth daily.    . traMADol (ULTRAM) 50 MG tablet Take 1 tablet (50 mg total) by mouth 2 (two) times daily. Not to be given with Xanax. 6 tablet 0  . valproic acid (DEPAKENE) 250 MG capsule Take 250 mg by mouth 2 (two) times daily.     No current facility-administered medications for this visit.    Allergies  Allergen Reactions  . Acetaminophen Other (See Comments)    Liver dysfunction  . Nsaids Other (See Comments)    Liver dysfunction   . Aspirin Other (See Comments)    Causes nosebleeds  . Theophylline Nausea And Vomiting  . Penicillin G Itching  .  Penicillins Itching and Other (See Comments)    Has patient had a PCN reaction causing immediate rash, facial/tongue/throat swelling, SOB or lightheadedness with hypotension: unknown Has patient had a PCN reaction causing severe rash involving mucus membranes or skin necrosis: unknown Has patient had a PCN reaction that required hospitalization unknown Has patient had a PCN reaction occurring within the last 10 years: yes If all of the above answers are "NO", then may proceed with Cephalosporin use.      Review of Systems: All systems reviewed and negative except where noted in HPI.    Dg Chest 2 View  Result Date: 06/13/2017 CLINICAL DATA:  Shortness breath, chest pain EXAM: CHEST  2 VIEW COMPARISON:  05/30/2017 FINDINGS: The heart size and mediastinal contours are within normal limits. Both lungs are clear. The visualized skeletal structures are unremarkable. IMPRESSION: No active cardiopulmonary disease. Electronically Signed   By: Kathreen Devoid   On: 06/13/2017 20:10   Dg Chest 2 View  Result Date: 05/30/2017 CLINICAL DATA:  Initial evaluation for acute shortness of breath. EXAM: CHEST  2 VIEW COMPARISON:  Prior radiograph from 07/08/2016. FINDINGS: Mild cardiomegaly. Mediastinal silhouette normal. Aortic atherosclerosis. Lungs hypoinflated. Streaky left perihilar and basilar atelectasis and/ or scarring. No focal infiltrates. No pulmonary edema or pleural effusion. No pneumothorax. No acute osseus abnormality. IMPRESSION: 1. Mild left perihilar and basilar atelectasis and/or scarring. 2. No  other active cardiopulmonary disease. 3. Mild cardiomegaly. 4. Aortic atherosclerosis. Electronically Signed   By: Jeannine Boga M.D.   On: 05/30/2017 16:25   Lab Results  Component Value Date   WBC 3.1 (L) 06/15/2017   HGB 7.9 (L) 06/15/2017   HCT 24.1 (L) 06/15/2017   MCV 94.9 06/15/2017   PLT 72 (L) 06/15/2017    Lab Results  Component Value Date   CREATININE 1.06 (H) 06/14/2017    BUN 26 (H) 06/14/2017   NA 141 06/14/2017   K 3.7 06/14/2017   CL 105 06/14/2017   CO2 31 06/14/2017    Lab Results  Component Value Date   ALT 38 06/14/2017   AST 44 (H) 06/14/2017   ALKPHOS 134 (H) 06/14/2017   BILITOT 0.9 06/14/2017    Lab Results  Component Value Date   INR 1.11 06/13/2017   INR 1.15 05/15/2017   INR 1.21 03/01/2016      Physical Exam: BP 134/72   Pulse 70   Ht 5\' 2"  (1.575 m)   Wt 171 lb (77.6 kg)   BMI 31.28 kg/m  Constitutional: Pleasant, female in no acute distress. Wearing oxygen HEENT: Normocephalic and atraumatic. Conjunctivae are pale. No scleral icterus. Neck supple.  Cardiovascular: Normal rate, regular rhythm.  Pulmonary/chest: Effort normal and breath sounds normal.  Abdominal: Soft, nondistended, nontender.  Extremities: no edema Lymphadenopathy: No cervical adenopathy noted. Neurological: Alert and oriented to person place and time. Skin: Skin is warm and dry. No rashes noted. Psychiatric: Normal mood and affect. Behavior is normal.   ASSESSMENT AND PLAN: 76 y/o female here for reassessment of the following issues:  GAVE / anemia - this has progressed significantly in recent months leading to multiple transfusions. She does not have any overt GI bleeding, but slow progressive blood loss. She had not responded well to APC in the past, had been receiving RFA at [facility]. She did well after her last EGD there however has had significant worsening in recent months. She had not been able to get back in to [facility] for RFA, daughter states it has been difficult to schedule with them. In the interim, [facility] has obtained RFA and now we have the capability to do it. I have discussed this option with Mrs. Gemme and her daughter. She was recently in hospice for a short period of time, but Mrs. Dimitrov was not ready for it and wished to have another attempt at RFA if possible in hopes of minimizing her transfusion requirement. She just had a RBC transfusion 2  days ago, her respiratory status is otherwise stable, I think she is stable for a procedure at this time. I have discussed risks / benefits of EGD and RFA with them, and she wished to proceed. If this does not work and she continues to need frequent transfusions, she will reconsider hospice. She will be on clear liquids for breakfast tomorrow and then NPO for her procedure in the afternoon. All questions answered.  NASH cirrhosis - having issues with encephalopathy recently, perhaps precipitated by UTI for which she was treated. On lactulose and rifaximin, seems clear today. She is due for [facility] screening - daughter states if she had [place], likely would not do any therapy, they want to hold off on [place] screening at this time. Otherwise will screen for varices with EGD tomorrow. She previously had baseline bradycardia, not sure how well she would tolerate a beta blocker. Will await EGD. Continue diuretics for now.   Dublin Cellar, MD [facility] Pager [phone]  CC: Garwin Brothers, MD

## 2017-06-18 ENCOUNTER — Encounter (HOSPITAL_COMMUNITY): Payer: Self-pay | Admitting: *Deleted

## 2017-06-18 ENCOUNTER — Encounter (HOSPITAL_COMMUNITY)
Admission: RE | Disposition: A | Discharge status: Home / Self Care | Payer: Self-pay | Source: Ambulatory Visit | Attending: Gastroenterology

## 2017-06-18 ENCOUNTER — Ambulatory Visit (HOSPITAL_COMMUNITY): Payer: Medicare Other | Admitting: Anesthesiology

## 2017-06-18 ENCOUNTER — Ambulatory Visit (HOSPITAL_COMMUNITY)
Admission: RE | Admit: 2017-06-18 | Discharge: 2017-06-18 | Disposition: A | Discharge status: Home / Self Care | Payer: Medicare Other | Source: Ambulatory Visit | Attending: Gastroenterology | Admitting: Gastroenterology

## 2017-06-18 DIAGNOSIS — Z79891 Long term (current) use of opiate analgesic: Secondary | ICD-10-CM | POA: Insufficient documentation

## 2017-06-18 DIAGNOSIS — J449 Chronic obstructive pulmonary disease, unspecified: Secondary | ICD-10-CM | POA: Diagnosis not present

## 2017-06-18 DIAGNOSIS — I5032 Chronic diastolic (congestive) heart failure: Secondary | ICD-10-CM | POA: Insufficient documentation

## 2017-06-18 DIAGNOSIS — F209 Schizophrenia, unspecified: Secondary | ICD-10-CM | POA: Diagnosis not present

## 2017-06-18 DIAGNOSIS — G2581 Restless legs syndrome: Secondary | ICD-10-CM | POA: Insufficient documentation

## 2017-06-18 DIAGNOSIS — I2510 Atherosclerotic heart disease of native coronary artery without angina pectoris: Secondary | ICD-10-CM | POA: Insufficient documentation

## 2017-06-18 DIAGNOSIS — F419 Anxiety disorder, unspecified: Secondary | ICD-10-CM | POA: Insufficient documentation

## 2017-06-18 DIAGNOSIS — E119 Type 2 diabetes mellitus without complications: Secondary | ICD-10-CM | POA: Insufficient documentation

## 2017-06-18 DIAGNOSIS — K31819 Angiodysplasia of stomach and duodenum without bleeding: Secondary | ICD-10-CM | POA: Insufficient documentation

## 2017-06-18 DIAGNOSIS — K219 Gastro-esophageal reflux disease without esophagitis: Secondary | ICD-10-CM | POA: Diagnosis not present

## 2017-06-18 DIAGNOSIS — E785 Hyperlipidemia, unspecified: Secondary | ICD-10-CM | POA: Insufficient documentation

## 2017-06-18 DIAGNOSIS — F329 Major depressive disorder, single episode, unspecified: Secondary | ICD-10-CM | POA: Diagnosis not present

## 2017-06-18 DIAGNOSIS — G4733 Obstructive sleep apnea (adult) (pediatric): Secondary | ICD-10-CM | POA: Diagnosis not present

## 2017-06-18 DIAGNOSIS — R58 Hemorrhage, not elsewhere classified: Secondary | ICD-10-CM

## 2017-06-18 DIAGNOSIS — D500 Iron deficiency anemia secondary to blood loss (chronic): Secondary | ICD-10-CM | POA: Diagnosis not present

## 2017-06-18 DIAGNOSIS — M810 Age-related osteoporosis without current pathological fracture: Secondary | ICD-10-CM | POA: Diagnosis not present

## 2017-06-18 DIAGNOSIS — Z79899 Other long term (current) drug therapy: Secondary | ICD-10-CM | POA: Insufficient documentation

## 2017-06-18 DIAGNOSIS — Z9981 Dependence on supplemental oxygen: Secondary | ICD-10-CM | POA: Diagnosis not present

## 2017-06-18 DIAGNOSIS — Z87891 Personal history of nicotine dependence: Secondary | ICD-10-CM | POA: Insufficient documentation

## 2017-06-18 DIAGNOSIS — Z888 Allergy status to other drugs, medicaments and biological substances status: Secondary | ICD-10-CM | POA: Insufficient documentation

## 2017-06-18 DIAGNOSIS — K7460 Unspecified cirrhosis of liver: Secondary | ICD-10-CM | POA: Insufficient documentation

## 2017-06-18 DIAGNOSIS — Z794 Long term (current) use of insulin: Secondary | ICD-10-CM | POA: Insufficient documentation

## 2017-06-18 DIAGNOSIS — I110 Hypertensive heart disease with heart failure: Secondary | ICD-10-CM | POA: Diagnosis not present

## 2017-06-18 DIAGNOSIS — Z880 Allergy status to penicillin: Secondary | ICD-10-CM | POA: Insufficient documentation

## 2017-06-18 DIAGNOSIS — F418 Other specified anxiety disorders: Secondary | ICD-10-CM | POA: Insufficient documentation

## 2017-06-18 HISTORY — DX: Hyperlipidemia, unspecified: E78.5

## 2017-06-18 HISTORY — DX: Schizophrenia, unspecified: F20.9

## 2017-06-18 HISTORY — PX: ESOPHAGOGASTRODUODENOSCOPY: SHX5428

## 2017-06-18 LAB — GLUCOSE, CAPILLARY
GLUCOSE-CAPILLARY: 138 mg/dL — AB (ref 65–99)
Glucose-Capillary: 159 mg/dL — ABNORMAL HIGH (ref 65–99)

## 2017-06-18 SURGERY — EGD (ESOPHAGOGASTRODUODENOSCOPY)
Anesthesia: Monitor Anesthesia Care

## 2017-06-18 MED ORDER — PROPOFOL 10 MG/ML IV BOLUS
INTRAVENOUS | Status: AC
  Filled 2017-06-18: qty 20

## 2017-06-18 MED ORDER — SODIUM CHLORIDE 0.9 % IV SOLN: INTRAVENOUS | Status: DC

## 2017-06-18 MED ORDER — PROPOFOL 10 MG/ML IV BOLUS
INTRAVENOUS | Status: DC | PRN
  Administered 2017-06-18: 20 mg via INTRAVENOUS
  Administered 2017-06-18: 30 mg via INTRAVENOUS
  Administered 2017-06-18 (×3): 20 mg via INTRAVENOUS

## 2017-06-18 MED ORDER — ONDANSETRON HCL 4 MG/2ML IJ SOLN
INTRAMUSCULAR | Status: AC
  Filled 2017-06-18: qty 2

## 2017-06-18 MED ORDER — LACTATED RINGERS IV SOLN
INTRAVENOUS | Status: DC
Start: 2017-06-18 — End: 2017-06-18
  Administered 2017-06-18: 13:00:00 via INTRAVENOUS

## 2017-06-18 MED ORDER — ONDANSETRON HCL 4 MG/2ML IJ SOLN
INTRAMUSCULAR | Status: DC | PRN
  Administered 2017-06-18: 4 mg via INTRAVENOUS

## 2017-06-18 MED ORDER — PROPOFOL 500 MG/50ML IV EMUL
INTRAVENOUS | Status: DC | PRN
  Administered 2017-06-18: 50 ug/kg/min via INTRAVENOUS

## 2017-06-18 NOTE — Discharge Instructions (Signed)
YOU HAD AN ENDOSCOPIC PROCEDURE TODAY: Refer to the procedure report and other information in the discharge instructions given to you for any specific questions about what was found during the examination. If this information does not answer your questions, please call [place] office at [phone] to clarify.  ° °YOU SHOULD EXPECT: Some feelings of bloating in the abdomen. Passage of more gas than usual. Walking can help get rid of the air that was put into your GI tract during the procedure and reduce the bloating. If you had a lower endoscopy (such as a colonoscopy or flexible sigmoidoscopy) you may notice spotting of blood in your stool or on the toilet paper. Some abdominal soreness may be present for a day or two, also. ° °DIET: Your first meal following the procedure should be a light meal and then it is ok to progress to your normal diet. A half-sandwich or bowl of soup is an example of a good first meal. Heavy or fried foods are harder to digest and may make you feel nauseous or bloated. Drink plenty of fluids but you should avoid alcoholic beverages for 24 hours. If you had a esophageal dilation, please see attached instructions for diet.   ° °ACTIVITY: Your care partner should take you home directly after the procedure. You should plan to take it easy, moving slowly for the rest of the day. You can resume normal activity the day after the procedure however YOU SHOULD NOT DRIVE, use power tools, machinery or perform tasks that involve climbing or major physical exertion for 24 hours (because of the sedation medicines used during the test).  ° °SYMPTOMS TO REPORT IMMEDIATELY: °A gastroenterologist can be reached at any hour. Please call [phone]  for any of the following symptoms:  °Following lower endoscopy (colonoscopy, flexible sigmoidoscopy) °Excessive amounts of blood in the stool  °Significant tenderness, worsening of abdominal pains  °Swelling of the abdomen that is new, acute  °Fever of 100° or  higher  °Following upper endoscopy (EGD, EUS, ERCP, esophageal dilation) °Vomiting of blood or coffee ground material  °New, significant abdominal pain  °New, significant chest pain or pain under the shoulder blades  °Painful or persistently difficult swallowing  °New shortness of breath  °Black, tarry-looking or red, bloody stools ° °FOLLOW UP:  °If any biopsies were taken you will be contacted by phone or by letter within the next 1-3 weeks. Call [phone]  if you have not heard about the biopsies in 3 weeks.  °Please also call with any specific questions about appointments or follow up tests. ° °

## 2017-06-18 NOTE — Op Note (Signed)
[facility] Patient Name: [name] Procedure Date: 06/18/2017 MRN: [id number] Attending MD: Carlota Raspberry. Armbruster MD, MD Date of Birth: [birth year] CSN: [id number] Age: 76 Admit Type: Inpatient Procedure:                Upper GI endoscopy Indications:              chronic blood loss anemia, history of severe GAVE                            requiring multiple transfusions, history of                            cirrhosis - here for RFA ablation of GAVE Providers:                Remo Lipps P. Armbruster MD, MD, Cleda Daub, RN,                            William Dalton, Technician Referring MD:              Medicines:                Monitored Anesthesia Care Complications:            No immediate complications. Estimated blood loss:                            Minimal. Estimated Blood Loss:     Estimated blood loss was minimal. Procedure:                Pre-Anesthesia Assessment:                           - Prior to the procedure, a History and Physical                            was performed, and patient medications and                            allergies were reviewed. The patient's tolerance of                            previous anesthesia was also reviewed. The risks                            and benefits of the procedure and the sedation                            options and risks were discussed with the patient.                            All questions were answered, and informed consent                            was obtained. Prior Anticoagulants: The patient has  taken no previous anticoagulant or antiplatelet                            agents. ASA Grade Assessment: III - A patient with                            severe systemic disease. After reviewing the risks                            and benefits, the patient was deemed in                            satisfactory condition to undergo the procedure.                            After obtaining informed consent, the endoscope was                            passed under direct vision. Throughout the                            procedure, the patient's blood pressure, pulse, and                            oxygen saturations were monitored continuously. The                            [id number]) scope was introduced through the                            mouth, and advanced to the second part of duodenum.                            The upper GI endoscopy was accomplished without                            difficulty. The patient tolerated the procedure                            well. Scope In: Scope Out: Findings:      Esophagogastric landmarks were identified: the Z-line was found at 38       cm, the gastroesophageal junction was found at 38 cm and the upper       extent of the gastric folds was found at 38 cm from the incisors.      The exam of the esophagus was otherwise normal. No obvious esophageal       varices noted.      Severe gastric antral vascular ectasia was present in the distal gastric       body and in the gastric antrum. Focal radiofrequency ablation of GAVE       was performed. With the endoscope in place, the position and extent of       the GAVE were noted. The endoscope was then removed from the patient.       The [id number]  radiofrequency ablation catheter was attached to the tip of       the endoscope. The endoscope with the attached radiofrequency ablation       catheter was then passed transorally under direct vision into the       esophagus and advanced to the areas of GAVE. The radiofrequency ablation       catheter was placed in contact with the surface of the gastric mucosa       under direct visualization and energy was applied - 54 applications were       made to the distal 1/3rd of the stomach at [place]. The areas of the       GAVE that had been ablated was examined with good response, some       transient mild oozing was  noted after ablation but no persistent       bleeding. Estimated blood loss was minimal.      The exam of the stomach was otherwise normal. No gastric varices noted      The duodenal bulb and second portion of the duodenum were normal. Impression:               - Esophagogastric landmarks identified.                           - Normal esophagus                           - Severe gastric antral vascular ectasia of the                            distal stomach. Treated with radiofrequency                            ablation as above.                           - Normal duodenal bulb and second portion of the                            duodenum. Moderate Sedation:      No moderate sedation, case performed with MAC Recommendation:           - Patient has a contact number available for                            emergencies. The signs and symptoms of potential                            delayed complications were discussed with the                            patient. Return to normal activities tomorrow.                            Written discharge instructions were provided to the                            patient.                           -  Full liquid diet for 1 day then advance to soft                            diet for the next week                           - Continue present medications.                           - NO NSAIDs                           - Carafate 1gm po q 6 hours PRN                           - Repeat CBC on Monday Procedure Code(s):        --- Professional ---                           [phone], Esophagogastroduodenoscopy, flexible,                            transoral; with ablation of tumor(s), polyp(s), or                            other lesion(s) (includes pre- and post-dilation                            and guide wire passage, when performed) Diagnosis Code(s):        --- Professional ---                           D31.438, Angiodysplasia of stomach and duodenum                             without bleeding                           R58, Hemorrhage, not elsewhere classified CPT copyright 2016 American Medical Association. All rights reserved. The codes documented in this report are preliminary and upon coder review may  be revised to meet current compliance requirements. Remo Lipps P. Armbruster MD, MD 06/18/2017 1:50:08 PM This report has been signed electronically. Number of Addenda: 0

## 2017-06-18 NOTE — H&P (View-Only) (Signed)
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 She had this done in February 2017. It worked to keep her from having a transfusion for a while. Since our last visit patient has had recurrent anemia with need for multiple transfusions over the past several months. She has had a recent hospitalization for worsening anemia leading to another blood transfusion and for treatment of UTI this past weekend. I had spoken with her family in recent weeks about how aggressive she wished to be with her care. She had briefly enrolled in hospice however the patient was "not ready for this" and was willing to try another endoscopy for RFA ablation given this worked for her previously to reduce transfusion requirements.  She reports she is taking iron TID, tolerating it, her stools are chronically dark. She has some mid to upper abdominal pain at times which comes and goes. She is taking protonix 40mg  BID. She states her breathing is at baseline and okay. She has chronic fatigue from her anemia. No chest pains or shortness of breath. She completed 3 day course of antibiotics recently for UTI. She is taking lactulose / rifaximin for encephalopathy, daughter present states for the most part she is doing okay in regards to her thinking. She is moving to a new long term care facility tomorrow.   Most recent workup: MRCP done 10/01/2016 - advanced cirrhosis, suggestive for presence of esophageal varices, stable findings of the pancreas compared to prior imaging on 9/28 with dilated PD and atrophy of the tail but no worrisome lesions of the pancreas or liver.  EGD 09/29/15 - no esophageal varices EUS  2/317 for abnormal pancreas imaging. 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  She's also had a history of dilated PD on MRCP.     Past Medical History:  Diagnosis Date  . Adenomatous colon polyp   . Anxiety   . Asthma   . Back pain   . Benign neoplasm of colon   . CAP (community acquired pneumonia) 2015  . Carpal tunnel syndrome on both sides   . Chronic airway obstruction, not elsewhere classified   . Chronic diastolic CHF (congestive heart failure) ([place])    a. 03/26/2014 Echo: EF 55-60%, no rwma, mild AS/AI, mod-sev Ca2+ MV annulus, mildly to mod dil LA.  [name] Coarse tremors    a. arms.  . Diverticulosis of colon (without mention of hemorrhage)   . Esophageal reflux   . Falls frequently    completed HHPT/OT 06/2014, PT unmet goals  . GAVE (gastric antral vascular ectasia)    angiodysplasia; s/p multiple APCs, discussing RFA with [facility] Dr Newman Pies (06/2015)  . H/O hiatal hernia   . Hepatitis   . Hiatal hernia   . Hyperlipidemia   . Iron deficiency anemia secondary to blood loss (chronic)    a. frequent PRBC transfusions.  . Liver cirrhosis secondary to nonalcoholic steatohepatitis (NASH) ([place])    a. dx'd 1990's  . Major depressive disorder, recurrent episode, severe, specified as with psychotic behavior   . Memory loss   . Midsternal chest pain    a. conservatively managed ->poor candidate for cath/anticoagulation.  . Mild aortic stenosis    a. 03/2014 Valve area (VTI): 2.89 cm^2, Valve  area (Vmax): 2.7 cm^2.  . Morbid obesity ([place])   . Neck pain   . Non-obstructive CAD   . On home oxygen therapy    "3L; 24/7" (11/29/2015)  . OSA (obstructive sleep apnea)    a. does not use CPAP. (01/25/2015)  . Osteoarthrosis, unspecified whether generalized or localized, unspecified site   . Osteoporosis, unspecified   . Palpitations   . Portal hypertensive gastropathy ([place])   . Protein calorie malnutrition ([place])   . Pure hypercholesterolemia   . Recurrent UTI (urinary tract infection)    h/o hospitalization with urosepsis 2015, but thought large component  colonization/bacteriuria, only treat if symptomatic ([place])  . Restless legs syndrome (RLS)   . Schizophrenia ([place])   . Thrombocytopenia ([place])   . Type II diabetes mellitus ([place])   . Unspecified chronic bronchitis ([place])   . Unspecified essential hypertension      Past Surgical History:  Procedure Laterality Date  . APPENDECTOMY    . APPENDECTOMY    . BALLOON DILATION  06/12/2012   Procedure: BALLOON DILATION;  Surgeon: Lafayette Dragon, MD;  Location: [facility] ENDOSCOPY;  Service: Endoscopy;  Laterality: N/A;  ?balloon  . BREAST BIOPSY     bilateral  . CESAREAN SECTION     x 3  . ENTEROSCOPY N/A 02/25/2015   Procedure: ENTEROSCOPY;  Surgeon: [name] Ada, MD;  Location: [facility]COPY;  Service: Endoscopy;  Laterality: N/A;  . ENTEROSCOPY N/A 06/27/2015   Procedure: ENTEROSCOPY;  Surgeon: Manus Gunning, MD;  Location: [facility] ENDOSCOPY;  Service: Gastroenterology;  Laterality: N/A;  . ESOPHAGOGASTRODUODENOSCOPY  06/12/2012   Procedure: ESOPHAGOGASTRODUODENOSCOPY (EGD);  Surgeon: Lafayette Dragon, MD;  Location: [facility];  Service: Endoscopy;  Laterality: N/A;  . ESOPHAGOGASTRODUODENOSCOPY  09/10/2012   Procedure: ESOPHAGOGASTRODUODENOSCOPY (EGD);  Surgeon: Lafayette Dragon, MD;  Location: [facility];  Service: Endoscopy;  Laterality: N/A;  . ESOPHAGOGASTRODUODENOSCOPY N/A 01/18/2013   Procedure: ESOPHAGOGASTRODUODENOSCOPY (EGD);  Surgeon: Lafayette Dragon, MD;  Location[facility] ENDOSCOPY;  Service: Endoscopy;  Laterality: N/A;  . ESOPHAGOGASTRODUODENOSCOPY N/A 05/24/2013   Procedure: ESOPHAGOGASTRODUODENOSCOPY (EGD);  Surgeon: Jerene Bears, MD;  Location: [place];  Service: Gastroenterology;  Laterality: N/A;  . ESOPHAGOGASTRODUODENOSCOPY N/A 10/20/2014   Procedure: ESOPHAGOGASTRODUODENOSCOPY (EGD);  Surgeon: Lafayette Dragon, MD;  Location: [facility];  Service: Endoscopy;  Laterality: N/A;  . EUS  09/2015   antral CAVL, ablated; nodular erosive gastritis; mult pancreatic cysts rec rpt CT pancreas  protocol or MRI to survey pancreas cysts 1-2 yrs (Dr Amie Critchley)  . HEMORROIDECTOMY    . HOT HEMOSTASIS  09/10/2012   Procedure: HOT HEMOSTASIS (ARGON PLASMA COAGULATION/BICAP);  Surgeon: Lafayette Dragon, MD;  Location: [facility];  Service: Endoscopy;  Laterality: N/A;  . HOT HEMOSTASIS N/A 05/24/2013   Procedure: HOT HEMOSTASIS (ARGON PLASMA COAGULATION/BICAP);  Surgeon: Jerene Bears, MD;  Location: [place];  Service: Gastroenterology;  Laterality: N/A;  . HOT HEMOSTASIS N/A 10/20/2014   Procedure: HOT HEMOSTASIS (ARGON PLASMA COAGULATION/BICAP);  Surgeon: Lafayette Dragon, MD;  Location: [facility];  Service: Endoscopy;  Laterality: N/A;  . US ECHOCARDIOGRAPHY  07/2014   mild LVH, EF 60-65%, normal wall motion, mild AR, mod dilated LA, mildly dilated RA, peak PA pressure 62mmHg  . VAGINAL HYSTERECTOMY     Family History  Problem Relation Age of Onset  . Heart disease Mother   . Cervical cancer Mother   . Kidney disease Mother   . Diabetes Mother   . Heart attack Mother   .  Diabetes Father   . Heart attack Father   . Multiple sclerosis Other   . Breast cancer Sister   . Multiple sclerosis Sister   . Breast cancer Maternal Aunt        x 2  . Diabetes Sister   . Stroke Daughter   . Stroke Daughter   . Lupus Daughter   . Colon cancer Neg Hx    Social History  Substance Use Topics  . Smoking status: Former Smoker    Packs/day: 1.50    Years: 25.00    Types: Cigarettes    Quit date: 08/26/1992  . Smokeless tobacco: Never Used  . Alcohol use No     Comment: 5/39/76 "last alcoholic drink was years ago"   Current Outpatient Prescriptions  Medication Sig Dispense Refill  . ADVAIR DISKUS 250-50 MCG/DOSE AEPB INHALE 1 PUFF INTO LUNGS TWICE A DAY 60 each 11  . albuterol (PROVENTIL) (2.5 MG/3ML) 0.083% nebulizer solution Take 2.5 mg by nebulization every 6 (six) hours as needed for wheezing or shortness of breath.    . ALPRAZolam (XANAX) 1 MG tablet Take 0.5-1 tablets (0.5-1 mg  total) by mouth 2 (two) times daily as needed for anxiety or sleep. 6 tablet 0  . atorvastatin (LIPITOR) 20 MG tablet Take 20 mg by mouth daily.     [name] buPROPion (WELLBUTRIN XL) 150 MG 24 hr tablet Take 150 mg by mouth daily.    [name] dicyclomine (BENTYL) 20 MG tablet Take 1 tablet (20 mg total) by mouth 2 (two) times daily. 20 tablet 0  . ferrous gluconate (FERGON) 324 MG tablet Take 324 mg by mouth 3 (three) times daily with meals.    [name] glucose blood (ONE TOUCH ULTRA TEST) test strip Check blood sugar 4 times daily 200 each 3  . Insulin Glargine (LANTUS SOLOSTAR) 100 UNIT/ML Solostar Pen Inject 60 Units into the skin every morning.    . insulin lispro (HUMALOG KWIKPEN) 100 UNIT/ML KiwkPen Inject 0-10 Units into the skin 4 (four) times daily -  before meals and at bedtime. 0-150=0 151-200=2 201-250=4 251-300=6 301-350=8 351-400=10    . Insulin Pen Needle (B-D ULTRAFINE III SHORT PEN) 31G X 8 MM MISC Use as directed to inject insulin twice daily. Dx: E11.8 200 each 2  . ipratropium-albuterol (DUONEB) 0.5-2.5 (3) MG/3ML SOLN Take 3 mLs by nebulization 2 (two) times daily. 180 mL 5  . isosorbide mononitrate (IMDUR) 30 MG 24 hr tablet Take 1 tablet (30 mg total) by mouth daily. 30 tablet 6  . Lactulose 20 GM/30ML SOLN Take 30 mLs (20 g total) by mouth 3 (three) times daily. hold one dose for diarrhea 30 mL   . levalbuterol (XOPENEX) 0.63 MG/3ML nebulizer solution Take 0.63 mg by nebulization 2 (two) times daily.   5  . metolazone (ZAROXOLYN) 2.5 MG tablet Take 2.5 mg by mouth See admin instructions. Take 2.5 mg by mouth daily. Take 2.5 mg by mouth daily as needed for weight gain 4 lbs in 24 hours    . Multiple Vitamin (MULTIVITAMIN WITH MINERALS) TABS tablet Take 1 tablet by mouth daily.     . nitroGLYCERIN (NITROSTAT) 0.4 MG SL tablet Place 1 tablet (0.4 mg total) under the tongue every 5 (five) minutes as needed for chest pain. No more than 3 in 15 minutes 25 tablet 1  . ONETOUCH DELICA LANCETS 73A  MISC USE TO CHECK SUGAR 4 TIMES DAILY AND AS NEEDED. LP:F79.02 **ONE TOUCH DELICA** 409 each 1  . oxyCODONE (OXY  IR/ROXICODONE) 5 MG immediate release tablet Take 1 tablet (5 mg total) by mouth every 6 (six) hours as needed for moderate pain or severe pain. 10 tablet 0  . OXYGEN Inhale 2 L into the lungs continuous.     . pantoprazole (PROTONIX) 40 MG tablet TAKE 1 TABLET BY MOUTH TWICE A DAY (Patient taking differently: Take 20 mg by mouth twice daily) 60 tablet 11  . polyethylene glycol (MIRALAX) packet Take 17 g by mouth daily. 2 each 0  . PROAIR HFA 108 (90 Base) MCG/ACT inhaler Take 1-2 puffs by mouth every 6 (six) hours as needed for wheezing.    . promethazine (PHENERGAN) 25 MG tablet Take 25 mg by mouth 2 (two) times daily as needed for nausea or vomiting.    [name] Propylene Glycol (SYSTANE BALANCE) 0.6 % SOLN Place 1 drop into both eyes every morning.    [name] QUEtiapine (SEROQUEL) 100 MG tablet Take 100 mg by mouth at bedtime.    . rifaximin (XIFAXAN) 550 MG TABS tablet Take 550 mg by mouth 2 (two) times daily.    [name] rOPINIRole (REQUIP) 4 MG tablet TAKE 1 TABLET BY MOUTH AT BEDTIME (Patient taking differently: TAKE 4 MG BY MOUTH AT BEDTIME) 30 tablet 6  . sulfamethoxazole-trimethoprim (BACTRIM DS,SEPTRA DS) 800-160 MG tablet Take 1 tablet by mouth every 12 (twelve) hours. 1 tablet 0  . torsemide (DEMADEX) 20 MG tablet Take 40 mg by mouth daily.    . traMADol (ULTRAM) 50 MG tablet Take 1 tablet (50 mg total) by mouth 2 (two) times daily. Not to be given with Xanax. 6 tablet 0  . valproic acid (DEPAKENE) 250 MG capsule Take 250 mg by mouth 2 (two) times daily.     No current facility-administered medications for this visit.    Allergies  Allergen Reactions  . Acetaminophen Other (See Comments)    Liver dysfunction  . Nsaids Other (See Comments)    Liver dysfunction   . Aspirin Other (See Comments)    Causes nosebleeds  . Theophylline Nausea And Vomiting  . Penicillin G Itching  .  Penicillins Itching and Other (See Comments)    Has patient had a PCN reaction causing immediate rash, facial/tongue/throat swelling, SOB or lightheadedness with hypotension: unknown Has patient had a PCN reaction causing severe rash involving mucus membranes or skin necrosis: unknown Has patient had a PCN reaction that required hospitalization unknown Has patient had a PCN reaction occurring within the last 10 years: yes If all of the above answers are "NO", then may proceed with Cephalosporin use.      Review of Systems: All systems reviewed and negative except where noted in HPI.    Dg Chest 2 View  Result Date: 06/13/2017 CLINICAL DATA:  Shortness breath, chest pain EXAM: CHEST  2 VIEW COMPARISON:  05/30/2017 FINDINGS: The heart size and mediastinal contours are within normal limits. Both lungs are clear. The visualized skeletal structures are unremarkable. IMPRESSION: No active cardiopulmonary disease. Electronically Signed   By: Kathreen Devoid   On: 06/13/2017 20:10   Dg Chest 2 View  Result Date: 05/30/2017 CLINICAL DATA:  Initial evaluation for acute shortness of breath. EXAM: CHEST  2 VIEW COMPARISON:  Prior radiograph from 07/08/2016. FINDINGS: Mild cardiomegaly. Mediastinal silhouette normal. Aortic atherosclerosis. Lungs hypoinflated. Streaky left perihilar and basilar atelectasis and/ or scarring. No focal infiltrates. No pulmonary edema or pleural effusion. No pneumothorax. No acute osseus abnormality. IMPRESSION: 1. Mild left perihilar and basilar atelectasis and/or scarring. 2. No  other active cardiopulmonary disease. 3. Mild cardiomegaly. 4. Aortic atherosclerosis. Electronically Signed   By: Jeannine Boga M.D.   On: 05/30/2017 16:25   Lab Results  Component Value Date   WBC 3.1 (L) 06/15/2017   HGB 7.9 (L) 06/15/2017   HCT 24.1 (L) 06/15/2017   MCV 94.9 06/15/2017   PLT 72 (L) 06/15/2017    Lab Results  Component Value Date   CREATININE 1.06 (H) 06/14/2017    BUN 26 (H) 06/14/2017   NA 141 06/14/2017   K 3.7 06/14/2017   CL 105 06/14/2017   CO2 31 06/14/2017    Lab Results  Component Value Date   ALT 38 06/14/2017   AST 44 (H) 06/14/2017   ALKPHOS 134 (H) 06/14/2017   BILITOT 0.9 06/14/2017    Lab Results  Component Value Date   INR 1.11 06/13/2017   INR 1.15 05/15/2017   INR 1.21 03/01/2016      Physical Exam: BP 134/72   Pulse 70   Ht 5\' 2"  (1.575 m)   Wt 171 lb (77.6 kg)   BMI 31.28 kg/m  Constitutional: Pleasant, female in no acute distress. Wearing oxygen HEENT: Normocephalic and atraumatic. Conjunctivae are pale. No scleral icterus. Neck supple.  Cardiovascular: Normal rate, regular rhythm.  Pulmonary/chest: Effort normal and breath sounds normal.  Abdominal: Soft, nondistended, nontender.  Extremities: no edema Lymphadenopathy: No cervical adenopathy noted. Neurological: Alert and oriented to person place and time. Skin: Skin is warm and dry. No rashes noted. Psychiatric: Normal mood and affect. Behavior is normal.   ASSESSMENT AND PLAN: 76 y/o female here for reassessment of the following issues:  GAVE / anemia - this has progressed significantly in recent months leading to multiple transfusions. She does not have any overt GI bleeding, but slow progressive blood loss. She had not responded well to APC in the past, had been receiving RFA at [facility]. She did well after her last EGD there however has had significant worsening in recent months. She had not been able to get back in to [facility] for RFA, daughter states it has been difficult to schedule with them. In the interim, [facility] has obtained RFA and now we have the capability to do it. I have discussed this option with Mrs. Connole and her daughter. She was recently in hospice for a short period of time, but Mrs. Bumgarner was not ready for it and wished to have another attempt at RFA if possible in hopes of minimizing her transfusion requirement. She just had a RBC transfusion 2  days ago, her respiratory status is otherwise stable, I think she is stable for a procedure at this time. I have discussed risks / benefits of EGD and RFA with them, and she wished to proceed. If this does not work and she continues to need frequent transfusions, she will reconsider hospice. She will be on clear liquids for breakfast tomorrow and then NPO for her procedure in the afternoon. All questions answered.  NASH cirrhosis - having issues with encephalopathy recently, perhaps precipitated by UTI for which she was treated. On lactulose and rifaximin, seems clear today. She is due for [facility] screening - daughter states if she had [place], likely would not do any therapy, they want to hold off on [place] screening at this time. Otherwise will screen for varices with EGD tomorrow. She previously had baseline bradycardia, not sure how well she would tolerate a beta blocker. Will await EGD. Continue diuretics for now.   [place] Cellar, MD [place]  [facility] Pager [phone]  CC: Garwin Brothers, MD

## 2017-06-18 NOTE — Anesthesia Preprocedure Evaluation (Signed)
Anesthesia Evaluation  Patient identified by MRN, date of birth, ID band Patient awake    Reviewed: Allergy & Precautions, H&P , NPO status , Patient's Chart, lab work & pertinent test results  History of Anesthesia Complications (+) PONV  Airway Mallampati: III  TM Distance: >3 FB Neck ROM: full    Dental  (+) Dental Advisory Given, Edentulous Upper, Edentulous Lower   Pulmonary shortness of breath, asthma , sleep apnea , COPD,  COPD inhaler, former smoker,  chronicbronchitis   Pulmonary exam normal breath sounds clear to auscultation       Cardiovascular Exercise Tolerance: Good hypertension, Pt. on home beta blockers and Pt. on medications + CAD and +CHF  Normal cardiovascular exam+ Valvular Problems/Murmurs AS  Rhythm:regular Rate:Normal  MildAS. Chronic diastolic heart failure. EF 55%. Non-obstructive CAD.   Neuro/Psych Anxiety Depression negative neurological ROS  negative psych ROS   GI/Hepatic negative GI ROS, GERD  Medicated and Controlled,(+) Cirrhosis       , Hepatitis -, Unspecified  Endo/Other  diabetes, Well Controlled, Type 2, Insulin DependentMorbid obesity  Renal/GU negative Renal ROS  negative genitourinary   Musculoskeletal  (+) Arthritis , Fibromyalgia -  Abdominal (+) + obese,   Peds  Hematology negative hematology ROS (+) anemia , Hg b 8.6   Anesthesia Other Findings   Reproductive/Obstetrics negative OB ROS                             Anesthesia Physical  Anesthesia Plan  ASA: III  Anesthesia Plan: MAC   Post-op Pain Management:    Induction:   PONV Risk Score and Plan:   Airway Management Planned: Nasal Cannula  Additional Equipment:   Intra-op Plan:   Post-operative Plan:   Informed Consent: I have reviewed the patients History and Physical, chart, labs and discussed the procedure including the risks, benefits and alternatives for the proposed  anesthesia with the patient or authorized representative who has indicated his/her understanding and acceptance.   Dental advisory given  Plan Discussed with: Surgeon  Anesthesia Plan Comments:         Anesthesia Quick Evaluation

## 2017-06-18 NOTE — Anesthesia Postprocedure Evaluation (Signed)
Anesthesia Post Note  Patient: ARLYNE BRANDES  Procedure(s) Performed: ESOPHAGOGASTRODUODENOSCOPY (EGD) with RFA (N/A ) GI RADIOFREQUENCY ABLATION (N/A )     Patient location during evaluation: Endoscopy Anesthesia Type: MAC Level of consciousness: awake and alert Pain management: pain level controlled Vital Signs Assessment: post-procedure vital signs reviewed and stable Respiratory status: spontaneous breathing, nonlabored ventilation, respiratory function stable and patient connected to nasal cannula oxygen Cardiovascular status: stable and blood pressure returned to baseline Postop Assessment: no apparent nausea or vomiting Anesthetic complications: no    Last Vitals:  Vitals:   06/18/17 1410 06/18/17 1415  BP: (!) 125/43   Pulse: (!) 52 (!) 59  Resp: 20 18  Temp:    SpO2: 97% 97%    Last Pain:  Vitals:   06/18/17 1347  TempSrc: Oral                 Montez Hageman

## 2017-06-18 NOTE — Transfer of Care (Signed)
Immediate Anesthesia Transfer of Care Note  Patient: [name]  Procedure(s) Performed: ESOPHAGOGASTRODUODENOSCOPY (EGD) with RFA (N/A ) GI RADIOFREQUENCY ABLATION (N/A )  Patient Location: PACU  Anesthesia Type:MAC  Level of Consciousness: sedated  Airway & Oxygen Therapy: Patient Spontanous Breathing and Patient connected to nasal cannula oxygen  Post-op Assessment: Report given to RN and Post -op Vital signs reviewed and stable  Post vital signs: Reviewed and stable  Last Vitals:  Vitals:   06/18/17 1210 06/18/17 1347  BP: (!) 112/41   Pulse: 63 65  Resp: 14 20  Temp: 36.7 C   SpO2: 94% 90%    Last Pain:  Vitals:   06/18/17 1210  TempSrc: Oral         Complications: No apparent anesthesia complications

## 2017-06-18 NOTE — Interval H&P Note (Signed)
History and Physical Interval Note:  06/18/2017 12:40 PM  [name]  has presented today for surgery, with the diagnosis of GAVE  The various methods of treatment have been discussed with the patient and family. After consideration of risks, benefits and other options for treatment, the patient has consented to  Procedure(s): ESOPHAGOGASTRODUODENOSCOPY (EGD) with RFA (N/A) GI RADIOFREQUENCY ABLATION (N/A) as a surgical intervention .  The patient's history has been reviewed, patient examined, no change in status, stable for surgery.  I have reviewed the patient's chart and labs.  Questions were answered to the patient's satisfaction.     [name]

## 2017-06-19 ENCOUNTER — Encounter: Payer: Self-pay | Admitting: Internal Medicine

## 2017-06-19 ENCOUNTER — Non-Acute Institutional Stay (SKILLED_NURSING_FACILITY): Payer: Medicare Other | Admitting: Internal Medicine

## 2017-06-19 DIAGNOSIS — K7200 Acute and subacute hepatic failure without coma: Secondary | ICD-10-CM

## 2017-06-19 DIAGNOSIS — N390 Urinary tract infection, site not specified: Secondary | ICD-10-CM

## 2017-06-19 DIAGNOSIS — K7682 Hepatic encephalopathy: Secondary | ICD-10-CM

## 2017-06-19 DIAGNOSIS — K31819 Angiodysplasia of stomach and duodenum without bleeding: Secondary | ICD-10-CM

## 2017-06-19 LAB — CULTURE, BLOOD (ROUTINE X 2)
Culture: NO GROWTH
Culture: NO GROWTH
Special Requests: ADEQUATE
Special Requests: ADEQUATE

## 2017-06-19 NOTE — Assessment & Plan Note (Signed)
Provendencia , status post ceftriaxone total of 3 days (Note: Initially changed to Bactrim DS after 2 doses of Ceftriaxone but organism was resistant and ceftriaxone given for a third dose)

## 2017-06-19 NOTE — Progress Notes (Signed)
NURSING HOME LOCATION:  [facility] ROOM NUMBER:  218-A  CODE STATUS:  Full Code  PCP:  Garwin Brothers, MD  [place] [address]    This is a comprehensive admission note to [facility] performed on this date less than 30 days from date of admission.   Included are preadmission medical/surgical history;reconciled medication list; family history; social history and comprehensive review of systems.  Corrections and additions to the records were documented . Comprehensive physical exam was also performed. Additionally a clinical summary was entered for each active diagnosis pertinent to this admission in the Problem List to enhance continuity of care.  HPI:   Patient was hospitalized 10/19-10/21/18, admitted from a nursing facility for changes in mental status in the context of acute on chronic anemia. She has a complex medical history including GAVE with chronic GI bleeding for which she has been receiving Procrit and periodic transfusions. 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 She is followed by Dr. Owens Loffler, GI Lactulose was initiated for elevated ammonia levels. She was also treated for Provendicia UTI. The hemoglobin rose from 6.3 up to 7.9 after 2 units of red packed cells. Ceftriaxone was changed to Bactrim but then she was given a third dose of ceftriaxone as the culture showed resistance to sulfa Most recent labs revealed hemoglobin 7.9 and hematocrit 24.1 with normochromic, normocytic indices. Platelet count was 72,000. Creatinine of 1.06, alk phosphatase 134, AST  44, albumin 2.6 and total protein 4.9. GFR was reduced at 51. At the SNF fasting glucose was 278 and bedtime glucose 199-234. She is on high-dose insulin with basal dose of 60 units and sliding scale prior to meals. Last A1c on record in Epic was 5.6% on 03/01/2016.  Past medical and surgical history: Diabetes with  atherosclerotic coronary artery disease, essential hypertension, COPD with chronic bronchitis, obstructive sleep apnea, chronic diastolic congestive heart failure, and history of psychotic illness with intermittent delirium & past medical history list schizophrenia  She has had many procedures & surgeries, most related to her GI bleeding issue.  Social history: Quit smoking in 1994, nondrinker  Family history: Reviewed  Review of systems:Could not be completed due to dementia. Date given as 06/29/[birth year]. Year was corrected to 2018. She was able to name the president. When asked if she had any specific symptoms, her answers were "lung, liver ". Her major concern is that her husband "left me for a 29 year old woman at the [facility]".  Physical exam:  Pertinent or positive findings: Ptosis greater on the right than the left. She is completely edentulous. She has a grade 2 systolic murmur loud at the left base. There is radiation into the carotids. Second heart sound is increased. Present are minor scattered rhonchi. Chest is barrel shaped. Abdomen is protuberant. She has one half-1+ pitting of the posterior ankle area. Pedal pulses are decreased. She has contractures of her toes. There is bruising over the forearms , greater on the left than the right. She has a 1 cm x 1 cm variceal of the right forearm. Strength is decreased in the in the upper extremities compared the lower extremities.  General appearance:Adequately nourished; no acute distress , increased work of breathing is present.   Lymphatic: No lymphadenopathy about the head, neck, axilla . Eyes: No conjunctival inflammation or lid edema is present. There is no scleral icterus. Ears:  External ear exam shows  no significant lesions or deformities.   Nose:  External nasal examination shows no deformity or inflammation. Nasal mucosa are pink and moist without lesions ,exudates Oral exam: lips and gums are healthy appearing. Neck:  No  thyromegaly, masses, tenderness noted.    Heart:  Normal rate and regular rhythm. S1 normal without gallop, click, rub .  Lungs:without wheezes, rales , rubs. Abdomen:Bowel sounds are normal. Abdomen is soft and nontender with no organomegaly, hernias,masses. GU: deferred  Extremities:  No cyanosis, clubbing  Neurologic exam : Balance,Rhomberg,finger to nose testing could not be completed due to clinical state Skin: Warm & dry w/o tenting. No significant lesions or rash.  See clinical summary under each active problem in the Problem List with associated updated therapeutic plan

## 2017-06-19 NOTE — Assessment & Plan Note (Signed)
06/19/17 patient appears back to baseline of dementia.SLUMS mental status testing pending

## 2017-06-19 NOTE — Assessment & Plan Note (Signed)
Hemoglobin 7.9 after 2 units of red packed cells Monitor for rebleeding

## 2017-06-21 ENCOUNTER — Encounter: Payer: Self-pay | Admitting: Internal Medicine

## 2017-06-21 NOTE — Patient Instructions (Signed)
See assessment and plan under each diagnosis in the problem list and acutely for this visit 

## 2017-06-23 ENCOUNTER — Encounter: Payer: Self-pay | Admitting: Adult Health

## 2017-06-23 ENCOUNTER — Non-Acute Institutional Stay (SKILLED_NURSING_FACILITY): Payer: Medicare Other | Admitting: Adult Health

## 2017-06-23 DIAGNOSIS — K7200 Acute and subacute hepatic failure without coma: Secondary | ICD-10-CM

## 2017-06-23 DIAGNOSIS — K7682 Hepatic encephalopathy: Secondary | ICD-10-CM

## 2017-06-23 DIAGNOSIS — D62 Acute posthemorrhagic anemia: Secondary | ICD-10-CM | POA: Diagnosis not present

## 2017-06-23 DIAGNOSIS — Z9181 History of falling: Secondary | ICD-10-CM

## 2017-06-23 DIAGNOSIS — R7989 Other specified abnormal findings of blood chemistry: Secondary | ICD-10-CM

## 2017-06-23 DIAGNOSIS — E876 Hypokalemia: Secondary | ICD-10-CM

## 2017-06-23 DIAGNOSIS — F419 Anxiety disorder, unspecified: Secondary | ICD-10-CM | POA: Diagnosis not present

## 2017-06-23 LAB — CBC AND DIFFERENTIAL
HCT: 22 — AB (ref 36–46)
Hemoglobin: 7.1 — AB (ref 12.0–16.0)
Neutrophils Absolute: 2
PLATELETS: 77 — AB (ref 150–399)
WBC: 2.3

## 2017-06-23 LAB — BASIC METABOLIC PANEL
BUN: 31 — AB (ref 4–21)
CREATININE: 1.3 — AB (ref 0.5–1.1)
Glucose: 140
POTASSIUM: 3.1 — AB (ref 3.4–5.3)
SODIUM: 142 (ref 137–147)

## 2017-06-23 LAB — HEPATIC FUNCTION PANEL
ALK PHOS: 129 — AB (ref 25–125)
ALT: 24 (ref 7–35)
AST: 31 (ref 13–35)
Bilirubin, Total: 0.7

## 2017-06-23 NOTE — Progress Notes (Signed)
DATE:  06/23/2017   MRN:  [id number]  BIRTHDAY: August 25, 1941  Facility:  Nursing Home Location:  [facility] [place] Room Number: 201-B  LEVEL OF CARE:  SNF (31)  Contact Information    Name Relation Home Work Mobile   Murphy Daughter [phone]  [phone]   Martha Clan [phone]     Leta Jungling Sister [phone]  [phone]       Code Status History    Date Active Date Inactive Code Status Order ID Comments User Context   06/14/2017  2:23 AM 06/15/2017  6:49 PM DNR [id number]  Toy Baker, MD ED   05/30/2017 10:28 PM 05/31/2017  6:29 PM DNR [id number]  Etta Quill, DO ED   05/15/2017  8:39 PM 05/17/2017  4:51 PM DNR [id number]  Caren Griffins, MD Inpatient   03/01/2016  1:12 PM 03/03/2016  2:13 PM DNR [id number]  Radene Gunning, NP ED   11/28/2015 11:39 AM 11/30/2015  4:49 PM DNR [id number]  Willia Craze, NP ED   11/01/2015  9:57 PM 11/06/2015  4:13 PM Partial Code [id number]  Ivor Costa, MD Inpatient   11/01/2015  9:23 PM 11/01/2015  9:56 PM Full Code [id number]  Ivor Costa, MD ED   08/28/2015  2:15 PM 09/01/2015  5:17 PM DNR [id number]  Radene Gunning, NP Inpatient   02/23/2015  8:50 PM 02/28/2015  7:41 PM Partial Code [id number]  Ivor Costa, MD ED   02/16/2015  6:06 PM 02/23/2015  8:50 PM DNR [id number]  Hennie Duos, MD Outpatient   01/25/2015  5:31 PM 01/28/2015  9:01 PM Full Code [id number]  Melton Alar, PA-C Inpatient   08/11/2014 11:31 PM 08/16/2014  3:16 PM Full Code [id number]  Rise Patience, MD Inpatient   03/26/2014  1:21 PM 03/31/2014  7:49 PM Partial Code [id number]  Bonnielee Haff, MD Inpatient   03/26/2014  1:46 AM 03/26/2014  1:21 PM Full Code [id number]  Hosie Poisson, MD ED   10/03/2013  4:35 AM 10/04/2013  4:21 PM DNR [id number]  Bynum Bellows, MD Inpatient   05/23/2013 12:23 AM 05/26/2013 10:07 PM Full Code [id number]  Theressa Millard, MD ED   02/16/2013  9:16 PM 02/19/2013  9:04 PM Full Code [id number]  Etta Quill., DO ED    02/01/2013 11:37 PM 02/04/2013  2:03 PM Full Code [id number]  Rise Patience, MD Inpatient   01/18/2013  3:46 AM 01/19/2013  5:57 PM Full Code [id number]  Orvan Falconer, MD Inpatient   11/22/2011 10:40 PM 11/26/2011  1:21 PM Full Code [id number]  Andreas Blower, RN Inpatient    Questions for Most Recent Historical Code Status (Order [id number])    Question Answer Comment   In the event of cardiac or respiratory ARREST Do not call a "code blue"    In the event of cardiac or respiratory ARREST Do not perform Intubation, CPR, defibrillation or ACLS    In the event of cardiac or respiratory ARREST Use medication by any route, position, wound care, and other measures to relive pain and suffering. May use oxygen, suction and manual treatment of airway obstruction as needed for comfort.        Chief Complaint  Patient presents with  . Acute Visit    Fall with right hip pain    HISTORY OF PRESENT ILLNESS: This is a 76 year old female who was reported to have had a fall last night . She was seen sitting on the floor  in a sitting position. She has complained of pain on her left hip, and right fingers. No bruising noted. She was seen in the room today with daughter at bedside. She was noted to be sleepy and opened her eyes only briefly then would fall asleep.    X-ray of left hip, pelvis and right hand showed no fracture. Noted hgb 7.1 and platelet 77. Ammonia level 100.  She has been admitted to [facility] on  06/18/17 from another nursing home. She was recently hospitalized due to altered mental status. Work-up confirmed anemia, UTI and elevated ammonia level. She was transfused 2 units PRBC, treated with Ceftriaxone and increased Lactulose dosage. Blood cultures done were negative. It was recommended for benzodiazepines to be given PRN only. She has history of liver cirrhosis, GAVE, COPD, IDDM, Bipolar disorder, and depression. She had EGD with RFA on 10/24. From the discharge  summary from hospital, HCPOA stated that RFA is something of last effort prior to reinstating hospice care.    PAST MEDICAL HISTORY:  Past Medical History:  Diagnosis Date  . Adenomatous colon polyp   . Anxiety   . Asthma   . Back pain   . Benign neoplasm of colon   . CAP (community acquired pneumonia) 2015  . Carpal tunnel syndrome on both sides   . Chronic airway obstruction, not elsewhere classified   . Chronic diastolic CHF (congestive heart failure) ([place])    a. 03/26/2014 Echo: EF 55-60%, no rwma, mild AS/AI, mod-sev Ca2+ MV annulus, mildly to mod dil LA.  [name] Coarse tremors    a. arms.  . Diverticulosis of colon (without mention of hemorrhage)   . Esophageal reflux   . Falls frequently    completed HHPT/OT 06/2014, PT unmet goals  . GAVE (gastric antral vascular ectasia)    angiodysplasia; s/p multiple APCs, discussing RFA with [facility] Dr Newman Pies (06/2015)  . H/O hiatal hernia   . Hepatitis   . Hiatal hernia   . Hyperlipidemia   . Iron deficiency anemia secondary to blood loss (chronic)    a. frequent PRBC transfusions.  . Liver cirrhosis secondary to nonalcoholic steatohepatitis (NASH) ([place])    a. dx'd 1990's  . Major depressive disorder, recurrent episode, severe, specified as with psychotic behavior   . Memory loss   . Midsternal chest pain    a. conservatively managed ->poor candidate for cath/anticoagulation.  . Mild aortic stenosis    a. 03/2014 Valve area (VTI): 2.89 cm^2, Valve area (Vmax): 2.7 cm^2.  . Morbid obesity ([place])   . Neck pain   . Non-obstructive CAD   . On home oxygen therapy    "3L; 24/7" (11/29/2015)  . OSA (obstructive sleep apnea)    a. does not use CPAP. (01/25/2015)  . Osteoarthrosis, unspecified whether generalized or localized, unspecified site   . Osteoporosis, unspecified   . Palpitations   . Portal hypertensive gastropathy ([place])   . Protein calorie malnutrition ([place])   . Recurrent UTI (urinary tract infection)    h/o  hospitalization with urosepsis 2015, but thought large component colonization/bacteriuria, only treat if symptomatic ([place])  . Restless legs syndrome (RLS)   . Schizophrenia ([place])   . Thrombocytopenia ([place])   . Type II diabetes mellitus ([place])   . Unspecified chronic bronchitis ([place])   . Unspecified essential hypertension      CURRENT MEDICATIONS: Reviewed  Patient's Medications  New Prescriptions   No medications on file  Previous Medications   ADVAIR DISKUS 250-50  MCG/DOSE AEPB    INHALE 1 PUFF INTO LUNGS TWICE A DAY   ALBUTEROL (PROVENTIL) (2.5 MG/3ML) 0.083% NEBULIZER SOLUTION    Take 2.5 mg by nebulization every 6 (six) hours as needed for wheezing or shortness of breath.   ALPRAZOLAM (XANAX) 1 MG TABLET    Take 1 mg by mouth See admin instructions. 1 mg BID scheduled and q12h prn   ATORVASTATIN (LIPITOR) 20 MG TABLET    Take 20 mg by mouth daily.    BUPROPION (WELLBUTRIN XL) 150 MG 24 HR TABLET    Take 150 mg by mouth daily.   DICYCLOMINE (BENTYL) 20 MG TABLET    Take 1 tablet (20 mg total) by mouth 2 (two) times daily.   FERROUS GLUCONATE (FERGON) 324 MG TABLET    Take 324 mg by mouth daily with breakfast.    GLUCOSE BLOOD (ONE TOUCH ULTRA TEST) TEST STRIP    Check blood sugar 4 times daily   INSULIN GLARGINE (LANTUS SOLOSTAR) 100 UNIT/ML SOLOSTAR PEN    Inject 60 Units into the skin every morning.   INSULIN LISPRO (HUMALOG KWIKPEN) 100 UNIT/ML KIWKPEN    Inject 0-10 Units into the skin 4 (four) times daily -  before meals and at bedtime. 0-150=0 151-200=2 201-250=4 251-300=6 301-350=8 351-400=10   INSULIN PEN NEEDLE (B-D ULTRAFINE III SHORT PEN) 31G X 8 MM MISC    Use as directed to inject insulin twice daily. Dx: E11.8   IPRATROPIUM-ALBUTEROL (DUONEB) 0.5-2.5 (3) MG/3ML SOLN    Take 3 mLs by nebulization 2 (two) times daily.   ISOSORBIDE MONONITRATE (IMDUR) 30 MG 24 HR TABLET    Take 1 tablet (30 mg total) by mouth daily.   LACTULOSE 20 GM/30ML SOLN    Take 30 mLs (20 g  total) by mouth 3 (three) times daily. hold one dose for diarrhea   LEVALBUTEROL (XOPENEX) 0.63 MG/3ML NEBULIZER SOLUTION    Take 0.63 mg by nebulization 2 (two) times daily.    METOLAZONE (ZAROXOLYN) 2.5 MG TABLET    Take 2.5 mg by mouth daily. Take 2.5 mg by mouth daily as needed for weight gain 4 lbs in 24 hours   MULTIPLE VITAMIN (MULTIVITAMIN WITH MINERALS) TABS TABLET    Take 1 tablet by mouth daily.    NITROGLYCERIN (NITROSTAT) 0.4 MG SL TABLET    Place 1 tablet (0.4 mg total) under the tongue every 5 (five) minutes as needed for chest pain. No more than 3 in 15 minutes   ONETOUCH DELICA LANCETS 15P MISC    USE TO CHECK SUGAR 4 TIMES DAILY AND AS NEEDED. PH:K32.76 **ONE TOUCH DELICA**   OXYCODONE (OXY IR/ROXICODONE) 5 MG IMMEDIATE RELEASE TABLET    Take 1 tablet (5 mg total) by mouth every 6 (six) hours as needed for moderate pain or severe pain.   OXYGEN    Inhale 2 L into the lungs continuous.    PANTOPRAZOLE (PROTONIX) 40 MG TABLET    TAKE 1 TABLET BY MOUTH TWICE A DAY   POLYETHYLENE GLYCOL (MIRALAX) PACKET    Take 17 g by mouth daily.   PROMETHAZINE (PHENERGAN) 25 MG TABLET    Take 25 mg by mouth 2 (two) times daily as needed for nausea or vomiting.   PROPYLENE GLYCOL (SYSTANE BALANCE) 0.6 % SOLN    Place 1 drop into both eyes every morning.   QUETIAPINE (SEROQUEL) 100 MG TABLET    Take 100 mg by mouth at bedtime.   RIFAXIMIN (XIFAXAN) 550 MG TABS TABLET  Take 550 mg by mouth 2 (two) times daily.   ROPINIROLE (REQUIP) 4 MG TABLET    Take 4 mg by mouth at bedtime.   SUCRALFATE (CARAFATE) 1 GM/10ML SUSPENSION    Take 1 g by mouth every 6 (six) hours as needed.   TORSEMIDE (DEMADEX) 20 MG TABLET    Take 40 mg by mouth daily.   TRAMADOL (ULTRAM) 50 MG TABLET    Take 1 tablet (50 mg total) by mouth 2 (two) times daily. Not to be given with Xanax.   VALPROIC ACID (DEPAKENE) 250 MG CAPSULE    Take 250 mg by mouth 2 (two) times daily.  Modified Medications   No medications on file    Discontinued Medications   No medications on file     Allergies  Allergen Reactions  . Acetaminophen Other (See Comments)    Liver dysfunction  . Nsaids Other (See Comments)    Liver dysfunction   . Aspirin Other (See Comments)    Causes nosebleeds  . Theophylline Nausea And Vomiting  . Penicillin G Itching  . Penicillins Itching and Other (See Comments)    Has patient had a PCN reaction causing immediate rash, facial/tongue/throat swelling, SOB or lightheadedness with hypotension: unknown Has patient had a PCN reaction causing severe rash involving mucus membranes or skin necrosis: unknown Has patient had a PCN reaction that required hospitalization unknown Has patient had a PCN reaction occurring within the last 10 years: yes If all of the above answers are "NO", then may proceed with Cephalosporin use.      REVIEW OF SYSTEMS: Unable to obtain due to being sleepy    PHYSICAL EXAMINATION  GENERAL APPEARANCE: Well nourished. In no acute distress. Obese SKIN:  Skin is warm and dry.  HEAD: Normal in size and contour. No evidence of trauma MOUTH and THROAT: Lips are without lesions. Oral mucosa is moist and without lesions. RESPIRATORY: breathing is even & unlabored, BS CTAB CARDIAC: RRR, + murmur,no extra heart sounds, BLE 1+ edema GI: abdomen soft, normal BS, no masses, no tenderness, no hepatomegaly, no splenomegaly EXTREMITIES:  Able to move X 4 extremities PSYCHIATRIC:  Affect and behavior are appropriate    LABS/RADIOLOGY: Labs reviewed: 06/23/17  WBC 2.3 hemoglobin 7.1 hematocrit 22.1 MCV 97.8 platelet 77 glucose 140 calcium 8.2 BUN 31.0 creatinine 1.25 sodium 142 K3.1 CO2 28 total protein 4.4 albumin Green 2.8 SGPT 24 SGOT 31 alkaline phosphatase 129 eGFR 84.69 Basic Metabolic Panel:  Recent Labs  05/31/17 0852 06/13/17 1826 06/14/17 0546  NA 140 139 141  K 3.5 3.2* 3.7  CL 107 100* 105  CO2 25 28 31   GLUCOSE 160* 140* 79  BUN 21* 29* 26*  CREATININE  0.95 1.17* 1.06*  CALCIUM 8.1* 8.4* 8.4*  MG  --  2.1 2.2  PHOS  --  4.0 4.1   Liver Function Tests:  Recent Labs  05/30/17 1630 06/13/17 1826 06/14/17 0546  AST 54* 48* 44*  ALT 49 35 38  ALKPHOS 138* 134* 134*  BILITOT 1.0 1.2 0.9  PROT 4.8* 4.7* 4.9*  ALBUMIN 2.8* 2.5* 2.6*    Recent Labs  06/13/17 1826  LIPASE 28    Recent Labs  05/16/17 1214 06/13/17 1826 06/14/17 0546  AMMONIA 22 86* 46*   CBC:  Recent Labs  05/15/17 1601  06/13/17 1826 06/14/17 0546 06/15/17 0602  WBC 1.9*  < > 3.0* 4.2 3.1*  NEUTROABS 1.4*  --  2.3  --   --   HGB  5.7*  < > 6.3* 6.9* 7.9*  HCT 18.5*  < > 19.4* 21.5* 24.1*  MCV 102.8*  < > 97.0 98.6 94.9  PLT 55*  < > 81* 84* 72*  < > = values in this interval not displayed. CBG:  Recent Labs  06/15/17 1143 06/18/17 1231 06/18/17 1422  GLUCAP 157* 138* 159*      Dg Chest 2 View  Result Date: 06/13/2017 CLINICAL DATA:  Shortness breath, chest pain EXAM: CHEST  2 VIEW COMPARISON:  05/30/2017 FINDINGS: The heart size and mediastinal contours are within normal limits. Both lungs are clear. The visualized skeletal structures are unremarkable. IMPRESSION: No active cardiopulmonary disease. Electronically Signed   By: Kathreen Devoid   On: 06/13/2017 20:10   Dg Chest 2 View  Result Date: 05/30/2017 CLINICAL DATA:  Initial evaluation for acute shortness of breath. EXAM: CHEST  2 VIEW COMPARISON:  Prior radiograph from 07/08/2016. FINDINGS: Mild cardiomegaly. Mediastinal silhouette normal. Aortic atherosclerosis. Lungs hypoinflated. Streaky left perihilar and basilar atelectasis and/ or scarring. No focal infiltrates. No pulmonary edema or pleural effusion. No pneumothorax. No acute osseus abnormality. IMPRESSION: 1. Mild left perihilar and basilar atelectasis and/or scarring. 2. No other active cardiopulmonary disease. 3. Mild cardiomegaly. 4. Aortic atherosclerosis. Electronically Signed   By: Jeannine Boga M.D.   On: 05/30/2017  16:25    ASSESSMENT/PLAN:  1. Status post fall - x-ray of left hip, pelvis and right hand is negative for fracture, monitor patient and give assistance as needed   2. Acute hepatic encephalopathy - probably due to elevated ammonia/liver cirrhosis, increase Lactulose 20 gm/30 ml give 30 ml from TID to QID, discontinue Xanax 1 mg BID and PRN, start Xanax 0.5 mg Q 12 hours PRN   3. Anemia due to acute blood loss - hgb  hgb 7.1, increase Ferrous Gluconate 324 mg from daily to BID, repeat CBC on 06/26/17   4. Increased ammonia level -  increase Lactulose 20 gm/30 ml give 30 ml from TID to QID   5. Hypokalemia - K 3.1, start KCL ER 20 meq 2 tabs = 40 meq tonight the 20 meq daily, check BMP in AM    6. Anxiety - discontinue Xanax 1 mg BID and PRN, start Xanax 0.5 mg 1 tab Q 12 hours PRN     Durenda Age, NP [employer] [phone]

## 2017-06-26 ENCOUNTER — Encounter (HOSPITAL_COMMUNITY): Payer: Self-pay | Admitting: Emergency Medicine

## 2017-06-26 ENCOUNTER — Non-Acute Institutional Stay (SKILLED_NURSING_FACILITY): Payer: Medicare Other | Admitting: Internal Medicine

## 2017-06-26 ENCOUNTER — Observation Stay (HOSPITAL_COMMUNITY)
Admission: EM | Admit: 2017-06-26 | Discharge: 2017-06-27 | Disposition: A | Discharge status: Skilled Nursing Facility | Payer: Medicare Other | Attending: Internal Medicine | Admitting: Internal Medicine

## 2017-06-26 ENCOUNTER — Encounter: Payer: Self-pay | Admitting: Internal Medicine

## 2017-06-26 DIAGNOSIS — K7460 Unspecified cirrhosis of liver: Secondary | ICD-10-CM | POA: Diagnosis present

## 2017-06-26 DIAGNOSIS — N183 Chronic kidney disease, stage 3 unspecified: Secondary | ICD-10-CM | POA: Diagnosis present

## 2017-06-26 DIAGNOSIS — K754 Autoimmune hepatitis: Secondary | ICD-10-CM | POA: Diagnosis not present

## 2017-06-26 DIAGNOSIS — D500 Iron deficiency anemia secondary to blood loss (chronic): Secondary | ICD-10-CM

## 2017-06-26 DIAGNOSIS — F209 Schizophrenia, unspecified: Secondary | ICD-10-CM | POA: Diagnosis not present

## 2017-06-26 DIAGNOSIS — K219 Gastro-esophageal reflux disease without esophagitis: Secondary | ICD-10-CM | POA: Insufficient documentation

## 2017-06-26 DIAGNOSIS — I440 Atrioventricular block, first degree: Secondary | ICD-10-CM | POA: Insufficient documentation

## 2017-06-26 DIAGNOSIS — K7682 Hepatic encephalopathy: Secondary | ICD-10-CM

## 2017-06-26 DIAGNOSIS — R040 Epistaxis: Secondary | ICD-10-CM

## 2017-06-26 DIAGNOSIS — Z87891 Personal history of nicotine dependence: Secondary | ICD-10-CM | POA: Insufficient documentation

## 2017-06-26 DIAGNOSIS — E119 Type 2 diabetes mellitus without complications: Secondary | ICD-10-CM | POA: Diagnosis not present

## 2017-06-26 DIAGNOSIS — K7290 Hepatic failure, unspecified without coma: Secondary | ICD-10-CM | POA: Diagnosis not present

## 2017-06-26 DIAGNOSIS — F333 Major depressive disorder, recurrent, severe with psychotic symptoms: Secondary | ICD-10-CM | POA: Diagnosis present

## 2017-06-26 DIAGNOSIS — Z79899 Other long term (current) drug therapy: Secondary | ICD-10-CM | POA: Insufficient documentation

## 2017-06-26 DIAGNOSIS — K7581 Nonalcoholic steatohepatitis (NASH): Secondary | ICD-10-CM | POA: Diagnosis not present

## 2017-06-26 DIAGNOSIS — D61818 Other pancytopenia: Secondary | ICD-10-CM | POA: Diagnosis not present

## 2017-06-26 DIAGNOSIS — Z9981 Dependence on supplemental oxygen: Secondary | ICD-10-CM | POA: Insufficient documentation

## 2017-06-26 DIAGNOSIS — R5381 Other malaise: Secondary | ICD-10-CM | POA: Diagnosis present

## 2017-06-26 DIAGNOSIS — K7200 Acute and subacute hepatic failure without coma: Secondary | ICD-10-CM | POA: Diagnosis not present

## 2017-06-26 DIAGNOSIS — Z794 Long term (current) use of insulin: Secondary | ICD-10-CM | POA: Insufficient documentation

## 2017-06-26 DIAGNOSIS — G40909 Epilepsy, unspecified, not intractable, without status epilepticus: Secondary | ICD-10-CM | POA: Insufficient documentation

## 2017-06-26 DIAGNOSIS — G4733 Obstructive sleep apnea (adult) (pediatric): Secondary | ICD-10-CM | POA: Insufficient documentation

## 2017-06-26 DIAGNOSIS — Z79891 Long term (current) use of opiate analgesic: Secondary | ICD-10-CM | POA: Insufficient documentation

## 2017-06-26 DIAGNOSIS — I130 Hypertensive heart and chronic kidney disease with heart failure and stage 1 through stage 4 chronic kidney disease, or unspecified chronic kidney disease: Secondary | ICD-10-CM | POA: Diagnosis not present

## 2017-06-26 DIAGNOSIS — Z66 Do not resuscitate: Secondary | ICD-10-CM | POA: Diagnosis present

## 2017-06-26 DIAGNOSIS — N939 Abnormal uterine and vaginal bleeding, unspecified: Secondary | ICD-10-CM

## 2017-06-26 DIAGNOSIS — J9611 Chronic respiratory failure with hypoxia: Secondary | ICD-10-CM | POA: Diagnosis not present

## 2017-06-26 DIAGNOSIS — I5032 Chronic diastolic (congestive) heart failure: Secondary | ICD-10-CM | POA: Insufficient documentation

## 2017-06-26 DIAGNOSIS — I2510 Atherosclerotic heart disease of native coronary artery without angina pectoris: Secondary | ICD-10-CM | POA: Diagnosis present

## 2017-06-26 DIAGNOSIS — M810 Age-related osteoporosis without current pathological fracture: Secondary | ICD-10-CM | POA: Diagnosis not present

## 2017-06-26 DIAGNOSIS — K3189 Other diseases of stomach and duodenum: Secondary | ICD-10-CM | POA: Insufficient documentation

## 2017-06-26 DIAGNOSIS — E785 Hyperlipidemia, unspecified: Secondary | ICD-10-CM | POA: Insufficient documentation

## 2017-06-26 DIAGNOSIS — Z9181 History of falling: Secondary | ICD-10-CM | POA: Insufficient documentation

## 2017-06-26 DIAGNOSIS — K31819 Angiodysplasia of stomach and duodenum without bleeding: Secondary | ICD-10-CM | POA: Diagnosis present

## 2017-06-26 DIAGNOSIS — F419 Anxiety disorder, unspecified: Secondary | ICD-10-CM | POA: Diagnosis not present

## 2017-06-26 DIAGNOSIS — R001 Bradycardia, unspecified: Secondary | ICD-10-CM | POA: Insufficient documentation

## 2017-06-26 DIAGNOSIS — D649 Anemia, unspecified: Secondary | ICD-10-CM | POA: Diagnosis not present

## 2017-06-26 DIAGNOSIS — G252 Other specified forms of tremor: Secondary | ICD-10-CM | POA: Insufficient documentation

## 2017-06-26 DIAGNOSIS — J449 Chronic obstructive pulmonary disease, unspecified: Secondary | ICD-10-CM | POA: Diagnosis not present

## 2017-06-26 DIAGNOSIS — G2581 Restless legs syndrome: Secondary | ICD-10-CM | POA: Insufficient documentation

## 2017-06-26 DIAGNOSIS — Z6833 Body mass index (BMI) 33.0-33.9, adult: Secondary | ICD-10-CM | POA: Insufficient documentation

## 2017-06-26 DIAGNOSIS — E1122 Type 2 diabetes mellitus with diabetic chronic kidney disease: Secondary | ICD-10-CM

## 2017-06-26 DIAGNOSIS — K766 Portal hypertension: Secondary | ICD-10-CM | POA: Diagnosis not present

## 2017-06-26 DIAGNOSIS — J4489 Other specified chronic obstructive pulmonary disease: Secondary | ICD-10-CM | POA: Diagnosis present

## 2017-06-26 DIAGNOSIS — I10 Essential (primary) hypertension: Secondary | ICD-10-CM | POA: Diagnosis present

## 2017-06-26 DIAGNOSIS — E7800 Pure hypercholesterolemia, unspecified: Secondary | ICD-10-CM | POA: Insufficient documentation

## 2017-06-26 LAB — IRON AND TIBC
IRON: 34 ug/dL (ref 28–170)
Saturation Ratios: 10 % — ABNORMAL LOW (ref 10.4–31.8)
TIBC: 343 ug/dL (ref 250–450)
UIBC: 309 ug/dL

## 2017-06-26 LAB — CBC
HCT: 20.4 % — ABNORMAL LOW (ref 36.0–46.0)
Hemoglobin: 6.4 g/dL — CL (ref 12.0–15.0)
MCH: 31.2 pg (ref 26.0–34.0)
MCHC: 31.4 g/dL (ref 30.0–36.0)
MCV: 99.5 fL (ref 78.0–100.0)
PLATELETS: 83 10*3/uL — AB (ref 150–400)
RBC: 2.05 MIL/uL — ABNORMAL LOW (ref 3.87–5.11)
RDW: 19.6 % — AB (ref 11.5–15.5)
WBC: 2.7 10*3/uL — ABNORMAL LOW (ref 4.0–10.5)

## 2017-06-26 LAB — RETICULOCYTES
RBC.: 2.07 MIL/uL — ABNORMAL LOW (ref 3.87–5.11)
Retic Count, Absolute: 126.3 10*3/uL (ref 19.0–186.0)
Retic Ct Pct: 6.1 % — ABNORMAL HIGH (ref 0.4–3.1)

## 2017-06-26 LAB — GLUCOSE, CAPILLARY: Glucose-Capillary: 223 mg/dL — ABNORMAL HIGH (ref 65–99)

## 2017-06-26 LAB — COMPREHENSIVE METABOLIC PANEL
ALBUMIN: 2.2 g/dL — AB (ref 3.5–5.0)
ALK PHOS: 112 U/L (ref 38–126)
ALT: 23 U/L (ref 14–54)
ANION GAP: 9 (ref 5–15)
AST: 33 U/L (ref 15–41)
BILIRUBIN TOTAL: 1 mg/dL (ref 0.3–1.2)
BUN: 33 mg/dL — AB (ref 6–20)
CO2: 28 mmol/L (ref 22–32)
CREATININE: 1.64 mg/dL — AB (ref 0.44–1.00)
Calcium: 8.3 mg/dL — ABNORMAL LOW (ref 8.9–10.3)
Chloride: 104 mmol/L (ref 101–111)
GFR calc Af Amer: 35 mL/min — ABNORMAL LOW (ref >60)
GFR calc non Af Amer: 30 mL/min — ABNORMAL LOW (ref >60)
GLUCOSE: 215 mg/dL — AB (ref 65–99)
Potassium: 3.1 mmol/L — ABNORMAL LOW (ref 3.5–5.1)
SODIUM: 141 mmol/L (ref 135–145)
TOTAL PROTEIN: 4.3 g/dL — AB (ref 6.5–8.1)

## 2017-06-26 LAB — AMMONIA: Ammonia: 59 umol/L — ABNORMAL HIGH (ref 9–35)

## 2017-06-26 LAB — VITAMIN B12: VITAMIN B 12: 1463 pg/mL — AB (ref 180–914)

## 2017-06-26 LAB — FOLATE: FOLATE: 13.9 ng/mL (ref >5.9)

## 2017-06-26 LAB — PREPARE RBC (CROSSMATCH)

## 2017-06-26 LAB — PROTIME-INR
INR: 1.14
Prothrombin Time: 14.5 seconds (ref 11.4–15.2)

## 2017-06-26 LAB — FERRITIN: FERRITIN: 18 ng/mL (ref 11–307)

## 2017-06-26 MED ORDER — ADULT MULTIVITAMIN W/MINERALS CH
1.0000 | ORAL_TABLET | Freq: Every day | ORAL | Status: DC
  Administered 2017-06-27: 1 via ORAL
  Filled 2017-06-26: qty 1

## 2017-06-26 MED ORDER — QUETIAPINE FUMARATE 25 MG PO TABS: 100.0000 mg | ORAL_TABLET | Freq: Every day | ORAL | Status: DC

## 2017-06-26 MED ORDER — ALPRAZOLAM 0.5 MG PO TABS
1.0000 mg | ORAL_TABLET | Freq: Two times a day (BID) | ORAL | Status: DC | PRN
  Administered 2017-06-26: 1 mg via ORAL
  Filled 2017-06-26: qty 2

## 2017-06-26 MED ORDER — SODIUM CHLORIDE 0.45 % IV SOLN
INTRAVENOUS | Status: DC
  Administered 2017-06-26: via INTRAVENOUS

## 2017-06-26 MED ORDER — LACTULOSE 10 GM/15ML PO SOLN
10.0000 g | Freq: Four times a day (QID) | ORAL | Status: DC
  Administered 2017-06-26 – 2017-06-27 (×4): 10 g via ORAL
  Filled 2017-06-26 (×4): qty 15

## 2017-06-26 MED ORDER — ATORVASTATIN CALCIUM 20 MG PO TABS
20.0000 mg | ORAL_TABLET | Freq: Every day | ORAL | Status: DC
  Administered 2017-06-27: 20 mg via ORAL
  Filled 2017-06-26: qty 1

## 2017-06-26 MED ORDER — TRAMADOL HCL 50 MG PO TABS: 50.0000 mg | ORAL_TABLET | Freq: Two times a day (BID) | ORAL | Status: DC | PRN

## 2017-06-26 MED ORDER — ISOSORBIDE MONONITRATE ER 30 MG PO TB24
30.0000 mg | ORAL_TABLET | Freq: Every day | ORAL | Status: DC
  Administered 2017-06-27: 30 mg via ORAL
  Filled 2017-06-26: qty 1

## 2017-06-26 MED ORDER — POTASSIUM CHLORIDE CRYS ER 20 MEQ PO TBCR
20.0000 meq | EXTENDED_RELEASE_TABLET | Freq: Every day | ORAL | Status: DC
  Administered 2017-06-27: 20 meq via ORAL
  Filled 2017-06-26: qty 1

## 2017-06-26 MED ORDER — TORSEMIDE 20 MG PO TABS
40.0000 mg | ORAL_TABLET | Freq: Every day | ORAL | Status: DC
  Administered 2017-06-26: 40 mg via ORAL
  Filled 2017-06-26 (×2): qty 2

## 2017-06-26 MED ORDER — FERROUS GLUCONATE 324 (38 FE) MG PO TABS
324.0000 mg | ORAL_TABLET | Freq: Every day | ORAL | Status: DC
  Administered 2017-06-27: 324 mg via ORAL
  Filled 2017-06-26: qty 1

## 2017-06-26 MED ORDER — SODIUM CHLORIDE 0.9 % IV SOLN
10.0000 mL/h | Freq: Once | INTRAVENOUS | Status: AC
Start: 2017-06-26 — End: 2017-06-26
  Administered 2017-06-26: 10 mL/h via INTRAVENOUS

## 2017-06-26 MED ORDER — ALBUTEROL SULFATE (2.5 MG/3ML) 0.083% IN NEBU: 2.5000 mg | INHALATION_SOLUTION | Freq: Four times a day (QID) | RESPIRATORY_TRACT | Status: DC | PRN

## 2017-06-26 MED ORDER — ROPINIROLE HCL 1 MG PO TABS
4.0000 mg | ORAL_TABLET | Freq: Every day | ORAL | Status: DC
  Administered 2017-06-26: 4 mg via ORAL
  Filled 2017-06-26: qty 4

## 2017-06-26 MED ORDER — IPRATROPIUM-ALBUTEROL 0.5-2.5 (3) MG/3ML IN SOLN
3.0000 mL | Freq: Two times a day (BID) | RESPIRATORY_TRACT | Status: DC
  Administered 2017-06-26 – 2017-06-27 (×2): 3 mL via RESPIRATORY_TRACT
  Filled 2017-06-26 (×3): qty 3

## 2017-06-26 MED ORDER — INSULIN GLARGINE 100 UNIT/ML ~~LOC~~ SOLN
60.0000 [IU] | Freq: Every day | SUBCUTANEOUS | Status: DC
  Administered 2017-06-26: 60 [IU] via SUBCUTANEOUS
  Filled 2017-06-26 (×2): qty 0.6

## 2017-06-26 MED ORDER — MOMETASONE FURO-FORMOTEROL FUM 200-5 MCG/ACT IN AERO
2.0000 | INHALATION_SPRAY | Freq: Two times a day (BID) | RESPIRATORY_TRACT | Status: DC
  Administered 2017-06-26 – 2017-06-27 (×2): 2 via RESPIRATORY_TRACT
  Filled 2017-06-26: qty 8.8

## 2017-06-26 MED ORDER — RIFAXIMIN 550 MG PO TABS
550.0000 mg | ORAL_TABLET | Freq: Two times a day (BID) | ORAL | Status: DC
  Administered 2017-06-26 – 2017-06-27 (×2): 550 mg via ORAL
  Filled 2017-06-26 (×2): qty 1

## 2017-06-26 MED ORDER — DARBEPOETIN ALFA 100 MCG/0.5ML IJ SOSY
100.0000 ug | PREFILLED_SYRINGE | INTRAMUSCULAR | Status: DC
  Administered 2017-06-26: 100 ug via SUBCUTANEOUS
  Filled 2017-06-26: qty 0.5

## 2017-06-26 MED ORDER — BUPROPION HCL ER (XL) 150 MG PO TB24
150.0000 mg | ORAL_TABLET | Freq: Every day | ORAL | Status: DC
  Administered 2017-06-27: 150 mg via ORAL
  Filled 2017-06-26: qty 1

## 2017-06-26 MED ORDER — PANTOPRAZOLE SODIUM 40 MG PO TBEC
40.0000 mg | DELAYED_RELEASE_TABLET | Freq: Two times a day (BID) | ORAL | Status: DC
  Administered 2017-06-26 – 2017-06-27 (×2): 40 mg via ORAL
  Filled 2017-06-26 (×2): qty 1

## 2017-06-26 MED ORDER — VALPROIC ACID 250 MG PO CAPS
250.0000 mg | ORAL_CAPSULE | Freq: Two times a day (BID) | ORAL | Status: DC
  Administered 2017-06-26 – 2017-06-27 (×2): 250 mg via ORAL
  Filled 2017-06-26 (×3): qty 1

## 2017-06-26 MED ORDER — DARBEPOETIN ALFA 40 MCG/0.4ML IJ SOSY
40.0000 ug | PREFILLED_SYRINGE | INTRAMUSCULAR | Status: DC
  Filled 2017-06-26: qty 0.4

## 2017-06-26 MED ORDER — OXYCODONE HCL 5 MG PO TABS: 5.0000 mg | ORAL_TABLET | Freq: Four times a day (QID) | ORAL | Status: DC | PRN

## 2017-06-26 NOTE — ED Triage Notes (Signed)
Pt coming from [facility] from EMS for low hgb (no lab result provided). Over the past two months, pt has been receiving frequent blood transfusions for lower GI bleed; dark tarry stools. Pt has no complaints at this time. BP 124/84, HR 86, 100% on 3L [place] (pt wears 3L continuously). Pt alert and oriented.
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
## 2017-06-26 NOTE — ED Notes (Signed)
Obtained consent for blood transfusion

## 2017-06-26 NOTE — Progress Notes (Signed)
Pharmacy consult Note: Aranesp  6 YOF with h/o chronic anemia previously on Aranesp 200 mcg SQ every 2 weeks. Patient is currently severely anemic and pharmacy consulted to resume Aranesp regimen. Iron panel with low iron levels and stores. Will start Aranesp 100 mcg SQ every week   [name], PharmD., BCPS Clinical Pharmacist Pager [phone]

## 2017-06-26 NOTE — Assessment & Plan Note (Addendum)
06/26/17 hemoglobin reported as 6.5, sent to ED for transfusion Confirm with GI as to whether the Aranesp should be restarted as per protocol above The patient and daughter state that Dr.Armbruster stopped it "because it wasn't working"

## 2017-06-26 NOTE — ED Notes (Signed)
CRITICAL VALUE ALERT  Critical Value:  Hgb 6.4  Date & Time Notied:  06/26/2017 1713   Provider Notified: Dr. Velna Hatchet  Orders Received/Actions taken: MD to see pt

## 2017-06-26 NOTE — H&P (Signed)
[facility] History and Physical  [name] CBS:[id number] DOB: 28-Mar-1941 DOA: 06/26/2017  Referring physician: Dr Kathrynn Humble PCP: Garwin Brothers, MD   Chief Complaint: Anemia  HPI: [name] is a 76 y.o. female with history of cirrhosis due to NASH, diast CHF, IDDM, seizure d/o, anxiety/ depression, COPD, hepatic enceph, GIB, GAVE, anemia who presents to ED today sent for low Hb measured at her SNF, [facility].  Has been receiving blood transfusions a lot over the last 2 months, was dx'd with a stomach condition and is getting "ablation" procedure per her GI doctor.  This according to pts daughter.  In ED the Hb was 6.4.  We are asked to see for admission.    History provided by the daughter.  Patient has many yrs hx of cirrhosis due to NASH. Has had physical decline and was put in SNF last year in July 2017 at [facility].  She had chronic anemia then (not GIB related per dtr) and was receiving Procrit shots for some time.  Those were stopped when she temporarily went onto hospice program.  Then hospice was discontinued but they havne't restarted the procrit shots.    Pt f/b Dr Havery Moros of GI for cirrhosis and GAVE with refractory anemia.  Hx of RFA of GAVE at [facility] in Feb 2017.  This worked for a while but here lately she has begun requiring lots of blood transfusions the last 2 mos.    Pt moved to [facility] from [facility] about 1 week ago.  Today her only complaitn is feeling tired and "washed out", no CP , no SOB, no abd pain.      Recent admits: Sept 20-22, 2018 > anemia due to [place] FE def, rec'd 2u prbc's. Acute hepatic enceph rx with lactulose. Low plts felt due to cirrhosis.  COPD w/ chron resp filaure on home O2 , no change. DM2 in insulin. Recurrent UTI's.  CHron diast CHF Oct 5- 6, 2018 > Hb 6.6, due to GAVE with chronic GI blood loss. Got 2u prbcs and was dc'd back to SNF.   Oct 19- 21 , 2018 > pt w AMS and low Hb, got 2u prbc's and lactulose and abx for UTI.  MS  improved.   ROS  denies CP  no joint pain   no HA  no blurry vision  no rash  no diarrhea  no nausea/ vomiting  no dysuria  no difficulty voiding  no change in urine color    Past Medical History  Past Medical History:  Diagnosis Date  . Adenomatous colon polyp   . Anxiety   . Asthma   . Back pain   . Benign neoplasm of colon   . CAP (community acquired pneumonia) 2015  . Carpal tunnel syndrome on both sides   . Chronic airway obstruction, not elsewhere classified   . Chronic diastolic CHF (congestive heart failure) ([place])    a. 03/26/2014 Echo: EF 55-60%, no rwma, mild AS/AI, mod-sev Ca2+ MV annulus, mildly to mod dil LA.  [name] Coarse tremors    a. arms.  . Diverticulosis of colon (without mention of hemorrhage)   . Esophageal reflux   . Falls frequently    completed HHPT/OT 06/2014, PT unmet goals  . GAVE (gastric antral vascular ectasia)    angiodysplasia; s/p multiple APCs, discussing RFA with [facility] Dr Newman Pies (06/2015)  . H/O hiatal hernia   . Hepatitis   . Hiatal hernia   . Hyperlipidemia   . Iron  deficiency anemia secondary to blood loss (chronic)    a. frequent PRBC transfusions.  . Liver cirrhosis secondary to nonalcoholic steatohepatitis (NASH) ([place])    a. dx'd 1990's  . Major depressive disorder, recurrent episode, severe, specified as with psychotic behavior   . Memory loss   . Midsternal chest pain    a. conservatively managed ->poor candidate for cath/anticoagulation.  . Mild aortic stenosis    a. 03/2014 Valve area (VTI): 2.89 cm^2, Valve area (Vmax): 2.7 cm^2.  . Morbid obesity ([place])   . Neck pain   . Non-obstructive CAD   . On home oxygen therapy    "3L; 24/7" (11/29/2015)  . OSA (obstructive sleep apnea)    a. does not use CPAP. (01/25/2015)  . Osteoarthrosis, unspecified whether generalized or localized, unspecified site   . Osteoporosis, unspecified   . Palpitations   . Portal hypertensive gastropathy ([place])   . Protein calorie malnutrition  ([place])   . Recurrent UTI (urinary tract infection)    h/o hospitalization with urosepsis 2015, but thought large component colonization/bacteriuria, only treat if symptomatic ([place])  . Restless legs syndrome (RLS)   . Schizophrenia ([place])   . Thrombocytopenia ([place])   . Type II diabetes mellitus ([place])   . Unspecified chronic bronchitis ([place])   . Unspecified essential hypertension    Past Surgical History  Past Surgical History:  Procedure Laterality Date  . APPENDECTOMY    . BALLOON DILATION  06/12/2012   Procedure: BALLOON DILATION;  Surgeon: Lafayette Dragon, MD;  Location: [facility] ENDOSCOPY;  Service: Endoscopy;  Laterality: N/A;  ?balloon  . BREAST BIOPSY     bilateral  . CESAREAN SECTION     x 3  . ENTEROSCOPY N/A 02/25/2015   Procedure: ENTEROSCOPY;  Surgeon: [name] Ada, MD;  Location: [facility] ENDOSCOPY;  Service: Endoscopy;  Laterality: N/A;  . ENTEROSCOPY N/A 06/27/2015   Procedure: ENTEROSCOPY;  Surgeon: Manus Gunning, MD;  Location: [facility] ENDOSCOPY;  Service: Gastroenterology;  Laterality: N/A;  . ESOPHAGOGASTRODUODENOSCOPY  06/12/2012   Procedure: ESOPHAGOGASTRODUODENOSCOPY (EGD);  Surgeon: Lafayette Dragon, MD;  Location: [facility];  Service: Endoscopy;  Laterality: N/A;  . ESOPHAGOGASTRODUODENOSCOPY  09/10/2012   Procedure: ESOPHAGOGASTRODUODENOSCOPY (EGD);  Surgeon: Lafayette Dragon, MD;  Location: [facility];  Service: Endoscopy;  Laterality: N/A;  . ESOPHAGOGASTRODUODENOSCOPY N/A 01/18/2013   Procedure: ESOPHAGOGASTRODUODENOSCOPY (EGD);  Surgeon: Lafayette Dragon, MD;  Location: [facility] ENDOSCOPY;  Service: Endoscopy;  Laterality: N/A;  . ESOPHAGOGASTRODUODENOSCOPY N/A 05/24/2013   Procedure: ESOPHAGOGASTRODUODENOSCOPY (EGD);  Surgeon: Jerene Bears, MD;  Location: [facility];  Service: Gastroenterology;  Laterality: N/A;  . ESOPHAGOGASTRODUODENOSCOPY N/A 10/20/2014   Procedure: ESOPHAGOGASTRODUODENOSCOPY (EGD);  Surgeon: Lafayette Dragon, MD;  Location: [facility];  Service: Endoscopy;   Laterality: N/A;  . ESOPHAGOGASTRODUODENOSCOPY N/A 06/18/2017   Procedure: ESOPHAGOGASTRODUODENOSCOPY (EGD) with RFA;  Surgeon: Yetta Flock, MD;  Location: [facility] ENDOSCOPY;  Service: Gastroenterology;  Laterality: N/A;  . EUS  09/2015   antral CAVL, ablated; nodular erosive gastritis; mult pancreatic cysts rec rpt CT pancreas protocol or MRI to survey pancreas cysts 1-2 yrs (Dr Amie Critchley)  . HEMORROIDECTOMY    . HOT HEMOSTASIS  09/10/2012   Procedure: HOT HEMOSTASIS (ARGON PLASMA COAGULATION/BICAP);  Surgeon: Lafayette Dragon, MD;  Location: [facility];  Service: Endoscopy;  Laterality: N/A;  . HOT HEMOSTASIS N/A 05/24/2013   Procedure: HOT HEMOSTASIS (ARGON PLASMA COAGULATION/BICAP);  Surgeon: Jerene Bears, MD;  Location: [facility];  Service: Gastroenterology;  Laterality: N/A;  . HOT HEMOSTASIS  N/A 10/20/2014   Procedure: HOT HEMOSTASIS (ARGON PLASMA COAGULATION/BICAP);  Surgeon: Lafayette Dragon, MD;  Location: [facility];  Service: Endoscopy;  Laterality: N/A;  . US ECHOCARDIOGRAPHY  07/2014   mild LVH, EF 60-65%, normal wall motion, mild AR, mod dilated LA, mildly dilated RA, peak PA pressure 94mHg  . VAGINAL HYSTERECTOMY     Family History  Family History  Problem Relation Age of Onset  . Heart disease Mother   . Cervical cancer Mother   . Kidney disease Mother   . Diabetes Mother   . Heart attack Mother   . Diabetes Father   . Heart attack Father   . Multiple sclerosis Other   . Breast cancer Sister   . Multiple sclerosis Sister   . Breast cancer Maternal Aunt        x 2  . Diabetes Sister   . Stroke Daughter   . Stroke Daughter   . Lupus Daughter   . Colon cancer Neg Hx    Social History  reports that she quit smoking about 24 years ago. Her smoking use included Cigarettes. She has a 37.50 pack-year smoking history. She has never used smokeless tobacco. She reports that she does not drink alcohol or use drugs. Allergies  Allergies  Allergen Reactions  .  Acetaminophen Other (See Comments)    Liver dysfunction  . Nsaids Other (See Comments)    Liver dysfunction   . Aspirin Other (See Comments)    Causes nosebleeds  . Theophylline Nausea And Vomiting  . Penicillin G Itching  . Penicillins Itching and Other (See Comments)    Has patient had a PCN reaction causing immediate rash, facial/tongue/throat swelling, SOB or lightheadedness with hypotension: unknown Has patient had a PCN reaction causing severe rash involving mucus membranes or skin necrosis: unknown Has patient had a PCN reaction that required hospitalization unknown Has patient had a PCN reaction occurring within the last 10 years: yes If all of the above answers are "NO", then may proceed with Cephalosporin use.    Home medications Prior to Admission medications   Medication Sig Start Date End Date Taking? Authorizing Provider  ADVAIR DISKUS 250-50 MCG/DOSE AEPB INHALE 1 PUFF INTO LUNGS TWICE A DAY 05/29/15   GRia Bush MD  albuterol (PROVENTIL) (2.5 MG/3ML) 0.083% nebulizer solution Take 2.5 mg by nebulization every 6 (six) hours as needed for wheezing or shortness of breath.    [provider]  ALPRAZolam (Duanne Moron 1 MG tablet Take 1 mg by mouth 2 (two) times daily as needed for anxiety.     [provider]  atorvastatin (LIPITOR) 20 MG tablet Take 20 mg by mouth daily.     [provider]  buPROPion (WELLBUTRIN XL) 150 MG 24 hr tablet Take 150 mg by mouth daily.    [provider]  dicyclomine (BENTYL) 20 MG tablet Take 1 tablet (20 mg total) by mouth 2 (two) times daily. 04/07/16   LDoristine Devoid PA-C  ferrous gluconate (FERGON) 324 MG tablet Take 324 mg by mouth daily with breakfast.     [provider]  glucose blood (ONE TOUCH ULTRA TEST) test strip Check blood sugar 4 times daily Patient not taking: Reported on 06/19/2017 10/16/15   GRia Bush MD  Insulin Glargine (LANTUS SOLOSTAR) 100 UNIT/ML Solostar Pen  Inject 60 Units into the skin every morning.    [provider]  insulin lispro (HUMALOG KWIKPEN) 100 UNIT/ML KiwkPen Inject 0-10 Units into the skin 4 (four) times  daily -  before meals and at bedtime. 0-150=0 151-200=2 201-250=4 251-300=6 301-350=8 351-400=10    [provider]  Insulin Pen Needle (B-D ULTRAFINE III SHORT PEN) 31G X 8 MM MISC Use as directed to inject insulin twice daily. Dx: E11.8 Patient not taking: Reported on 06/19/2017 10/13/15   Renato Shin, MD  ipratropium-albuterol (DUONEB) 0.5-2.5 (3) MG/3ML SOLN Take 3 mLs by nebulization 2 (two) times daily. 03/25/16   Noralee Space, MD  isosorbide mononitrate (IMDUR) 30 MG 24 hr tablet Take 1 tablet (30 mg total) by mouth daily. 04/01/16   Richardson Dopp T, PA-C  lactulose (CHRONULAC) 10 GM/15ML solution Take 10 g by mouth 4 (four) times daily.    [provider]  levalbuterol Penne Lash) 0.63 MG/3ML nebulizer solution Take 0.63 mg by nebulization 2 (two) times daily.  06/05/14   [provider]  metolazone (ZAROXOLYN) 2.5 MG tablet Take 2.5 mg by mouth daily. Take 2.5 mg by mouth daily as needed for weight gain 4 lbs in 24 hours    [provider]  Multiple Vitamin (MULTIVITAMIN WITH MINERALS) TABS tablet Take 1 tablet by mouth daily.     [provider]  nitroGLYCERIN (NITROSTAT) 0.4 MG SL tablet Place 1 tablet (0.4 mg total) under the tongue every 5 (five) minutes as needed for chest pain. No more than 3 in 15 minutes 02/16/16   Sherren Mocha, MD  oxyCODONE (OXY IR/ROXICODONE) 5 MG immediate release tablet Take 1 tablet (5 mg total) by mouth every 6 (six) hours as needed for moderate pain or severe pain. 06/15/17   Patrecia Pour, MD  OXYGEN Inhale 2 L into the lungs continuous.     [provider]  pantoprazole (PROTONIX) 40 MG tablet TAKE 1 TABLET BY MOUTH TWICE A DAY 10/12/15   Ria Bush, MD  potassium chloride SA (K-DUR,KLOR-CON) 20 MEQ tablet Take 20 mEq by  mouth daily.    [provider]  promethazine (PHENERGAN) 12.5 MG tablet Take 12.5 mg by mouth every 12 (twelve) hours.    [provider]  Propylene Glycol (SYSTANE BALANCE) 0.6 % SOLN Place 1 drop into both eyes every morning.    [provider]  QUEtiapine (SEROQUEL) 100 MG tablet Take 100 mg by mouth at bedtime.    [provider]  rifaximin (XIFAXAN) 550 MG TABS tablet Take 550 mg by mouth 2 (two) times daily.    [provider]  rOPINIRole (REQUIP) 4 MG tablet Take 4 mg by mouth at bedtime.    [provider]  sucralfate (CARAFATE) 1 GM/10ML suspension Take 1 g by mouth every 6 (six) hours as needed.    [provider]  torsemide (DEMADEX) 20 MG tablet Take 40 mg by mouth daily.    [provider]  traMADol (ULTRAM) 50 MG tablet Take 1 tablet (50 mg total) by mouth 2 (two) times daily. Not to be given with Xanax. 06/15/17   Patrecia Pour, MD  valproic acid (DEPAKENE) 250 MG capsule Take 250 mg by mouth 2 (two) times daily.    [provider]   Liver Function Tests  Recent Labs Lab 06/23/17 06/26/17 1634  AST 31 33  ALT 24 23  ALKPHOS 129* 112  BILITOT  --  1.0  PROT  --  4.3*  ALBUMIN  --  2.2*   No results for input(s): LIPASE, AMYLASE in the last 168 hours. CBC  Recent Labs Lab 06/23/17 06/26/17 1634  WBC 2.3 2.7*  NEUTROABS  2  --   HGB 7.1* 6.4*  HCT 22* 20.4*  MCV  --  99.5  PLT 77* 83*   Basic Metabolic Panel  Recent Labs Lab 06/23/17 06/26/17 1634  NA 142 141  K 3.1* 3.1*  CL  --  104  CO2  --  28  GLUCOSE  --  215*  BUN 31* 33*  CREATININE 1.3* 1.64*  CALCIUM  --  8.3*     Vitals:   06/26/17 1630 06/26/17 1700 06/26/17 1800 06/26/17 1830  BP: (!) 116/53 (!) 123/48 (!) 130/47 (!) 120/37  Pulse: 62 62 61 68  Resp: _0 (!) 25  Temp:      TempSrc:      SpO2: 100% 100% 100% 100%  Weight:      Height:       Exam: Gen elderly pale female, oriented x 3 No rash,  cyanosis or gangrene Sclera anicteric, throat clear and dry  No jvd or bruits Chest clear bilat RRR no MRG Abd soft ntnd no mass or ascites +bs obese GU defer MS no joint effusions or deformity Ext no LE or UE edema / no wounds or ulcers Neuro is alert, Ox 3 , nf, no asterixis    Home meds: -lantus 60 qam/ humalog 0-10 qid  -Imdur 30/ zaroxolyn 2.5 qd prn/ KDur 20 qd/ torsemide 40 qd/ SL NTG prn -valproic acid 250 bid -tramadol 50 bid prn / phenergan 12.5 bid/ Oxy IR 5 mg qid prn/ xanax 1 bid prn/ benntyl 20 bid prn/ requip 4 hs/ seroquel 100 hs/ wellbutrin xl 150 qd -advair diskus bid/ albuterol neb qid prn/ duoneb bid/ xopenex bid . oxygen -carafate/ rifaximin 550 bid/ protonix 40/ MVI/ lactulose 10 gm qid/ lipitor/ fergon   Na 141  K 3.1  BUN 33  Cr 1.64  CA 8.3  Alb 2.2   LFT's ok  NH3 = 59  eGFR 30 Fe 34  tsat 10%  ferr 18  Folate 13.9 WBC 2.7  Hb 6.4  mcv 99  plt 83 INR 1.1   Assessment: 1. Symptomatic anemia - in pt with cirrhosis and hx GAVE.  PT's GI doctor is planning RFA treatments for this gastric lesion.  If it doesn't help reduce tranfusion needs, family is considering going back to hospice.  Daughter wants [place] (and says her PCP also wants this) to resume her procrit.  Will consult pharmacy for esa.  2. 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 Hepatic enceph - on lactulose/ rifaximin, compensated  4. Diast CHF chronic - cont torsemide. Hold prn zarox. Looks a little dry, will give low rate IVF.  5. CKD 3 - baseline creat 1.2- 1.6 6. Depression w psychotic features - stable 7. Pancytopenia - prob due to cirrhosis. 8. Seizure d/o - cont [facility] 9. COPD on home O2 - cont nebs  10. DVT proph - SCD 11. DNR - confirmed with dtr   Plan - as above       [place] [facility] Pager [phone]   If 7PM-7AM, please contact night-coverage [email or web address] Password [email or web address] 06/26/2017, 6:59 PM

## 2017-06-26 NOTE — Patient Instructions (Addendum)
See Current Assessment & Plan in Problem List under specific Diagnosis Total time 41  minutes; greater than 50% of the visit spent counseling patient and coordinating care for problems addressed at this encounter

## 2017-06-26 NOTE — Assessment & Plan Note (Addendum)
06/26/2017 ammonia level 153, in September and October range  22-86. Daughters are concerned that this correlates with her level of encephalopathy. On exam today she's more alert but obviously confused as she was at admission exam. GI consult will be pursued to determine significance of the ammonia level and guidelines for its monitor and intervention if indicated. She remains on Lactulose 10 g qid

## 2017-06-26 NOTE — Assessment & Plan Note (Addendum)
06/26/17 no epistaxis visualized, probably related to nasal mucosa drying from oxygen Eucerin topically twice a day to the nasal septum

## 2017-06-26 NOTE — Progress Notes (Signed)
NURSING HOME LOCATION:  [facility] ROOM NUMBER:  [id number]  CODE STATUS:  Full Code  PCP:  Garwin Brothers, MD  [place] [id number]   This is a nursing facility follow up for specific acute issue of elevated ammonia level.  Interim medical record and care since last [facility] visit was updated with review of diagnostic studies and change in clinical status since last visit were documented.  HPI: The patient's daughters were concerned about her ammonia level which they feel correlates with her level of encephalopathy. It is 153 today. In September and October ammonia ranged from a low of 22 up to 86. On 10/20 the value was 46. She has seen Dr. Havery Moros , [facility], for her complex presentation of autoimmune hepatitis, gastric antral vascular ectasia with intermittent bleeding,& hepatic cirrhosis secondary to NASH. No recurrent GI bleeding has been reported, but stool was described as dark. The patient states that she had epistaxis 2 weeks ago. She also describes intermittent vaginal bleeding. Neither her family or staff substantiate the latter.  Hemoglobin 7.9 on 10/21, 7.1 on 10/29, & reported as 6.5 today. The results were confirmed on a second analyzer according to the [facility]  report. Hematocrit 20.3 and platelet count 75,000. Platelet count was 77,000 on 10/29 and 72,000 on 10/21. Also her white count is 2500. Differential reveals slightly low neutrophil number and lymphocyte number Previously she was on a protocol for Aranesp to be  administered every 2 weeks. Aranesp is to be held if hemoglobins over 10. She was to receive blood transfusions if Hgb  less than 8. Apparently this was discontinued in September by Dr Havery Moros due to lack of response.  Review of systems: Dementia invalidated responses. She does describe edema. She also describes her right foot as being cold The staff states that she will try to get up by herself even having been told to call  for assistance. She will adamantly claim that she will get up by herself despite repeated instructions about risk. She will decline her breathing treatments saying she is not short of breath & "nothing is wrong".  Physical exam:  Pertinent or positive findings: She is more alert then @ admission. She answers questions in a grandiose manner waving her hands and bobbing her head. Otherwise she will sit in the bed rocking back and forth.She is wearing nasal oxygen. The septal mucosa is dry without active bleeding. She is edentulous. Grade 1.5 flow murmur is present. 2nd heart sound is increased. Breath Sounds are decreased. Dullness to percussion right upper quadrant. Pedal pulses are decreased. Present are contractures of the left > right toes. She has trace-1/2+ edema at the ankles. Her feet have equal temperature, warm without ischemic change.  General appearance:Adequately nourished; no acute distress , increased work of breathing is present.   Lymphatic: No lymphadenopathy about the head, neck, axilla . Eyes: No conjunctival inflammation or lid edema is present. There is no scleral icterus. Ears:  External ear exam shows no significant lesions or deformities.   Nose:  External nasal examination shows no deformity or inflammation. Nasal mucosa are pink and moist without lesions ,exudates Oral exam: lips and gums are healthy appearing.There is no oropharyngeal erythema or exudate . Neck:  No thyromegaly, masses, tenderness noted.    Heart:  Normal rate and regular rhythm. S1  normal without gallop, click, rub .  Lungs: without wheezes, rhonchi,rales , rubs. Abdomen:Bowel sounds are normal. Abdomen is soft  and nontender with no organomegaly, hernias,masses. GU: deferred  Extremities:  No cyanosis, clubbing  Skin: Warm & dry w/o tenting. No significant lesions or rash.  See summary under each active problem in the Problem List with associated updated therapeutic plan

## 2017-06-27 DIAGNOSIS — I10 Essential (primary) hypertension: Secondary | ICD-10-CM

## 2017-06-27 DIAGNOSIS — K31819 Angiodysplasia of stomach and duodenum without bleeding: Secondary | ICD-10-CM | POA: Diagnosis not present

## 2017-06-27 DIAGNOSIS — E1122 Type 2 diabetes mellitus with diabetic chronic kidney disease: Secondary | ICD-10-CM | POA: Diagnosis not present

## 2017-06-27 DIAGNOSIS — J449 Chronic obstructive pulmonary disease, unspecified: Secondary | ICD-10-CM | POA: Diagnosis not present

## 2017-06-27 DIAGNOSIS — F333 Major depressive disorder, recurrent, severe with psychotic symptoms: Secondary | ICD-10-CM | POA: Diagnosis not present

## 2017-06-27 DIAGNOSIS — K754 Autoimmune hepatitis: Secondary | ICD-10-CM | POA: Diagnosis not present

## 2017-06-27 DIAGNOSIS — R5381 Other malaise: Secondary | ICD-10-CM | POA: Diagnosis not present

## 2017-06-27 DIAGNOSIS — Z794 Long term (current) use of insulin: Secondary | ICD-10-CM

## 2017-06-27 DIAGNOSIS — D61818 Other pancytopenia: Secondary | ICD-10-CM | POA: Diagnosis not present

## 2017-06-27 DIAGNOSIS — K7581 Nonalcoholic steatohepatitis (NASH): Secondary | ICD-10-CM | POA: Diagnosis not present

## 2017-06-27 DIAGNOSIS — K7460 Unspecified cirrhosis of liver: Secondary | ICD-10-CM | POA: Diagnosis not present

## 2017-06-27 DIAGNOSIS — D649 Anemia, unspecified: Secondary | ICD-10-CM | POA: Diagnosis not present

## 2017-06-27 LAB — GLUCOSE, CAPILLARY
GLUCOSE-CAPILLARY: 142 mg/dL — AB (ref 65–99)
Glucose-Capillary: 129 mg/dL — ABNORMAL HIGH (ref 65–99)

## 2017-06-27 LAB — BASIC METABOLIC PANEL WITH GFR
Anion gap: 8 (ref 5–15)
BUN: 29 mg/dL — ABNORMAL HIGH (ref 6–20)
CO2: 30 mmol/L (ref 22–32)
Calcium: 8.5 mg/dL — ABNORMAL LOW (ref 8.9–10.3)
Chloride: 102 mmol/L (ref 101–111)
Creatinine, Ser: 1.4 mg/dL — ABNORMAL HIGH (ref 0.44–1.00)
GFR calc Af Amer: 42 mL/min — ABNORMAL LOW
GFR calc non Af Amer: 36 mL/min — ABNORMAL LOW
Glucose, Bld: 136 mg/dL — ABNORMAL HIGH (ref 65–99)
Potassium: 3 mmol/L — ABNORMAL LOW (ref 3.5–5.1)
Sodium: 140 mmol/L (ref 135–145)

## 2017-06-27 LAB — CBC
HEMATOCRIT: 26.2 % — AB (ref 36.0–46.0)
Hemoglobin: 8.4 g/dL — ABNORMAL LOW (ref 12.0–15.0)
MCH: 30.5 pg (ref 26.0–34.0)
MCHC: 32.1 g/dL (ref 30.0–36.0)
MCV: 95.3 fL (ref 78.0–100.0)
PLATELETS: 77 10*3/uL — AB (ref 150–400)
RBC: 2.75 MIL/uL — ABNORMAL LOW (ref 3.87–5.11)
RDW: 19.2 % — AB (ref 11.5–15.5)
WBC: 4 10*3/uL (ref 4.0–10.5)

## 2017-06-27 LAB — MRSA PCR SCREENING: MRSA by PCR: NEGATIVE

## 2017-06-27 MED ORDER — ALPRAZOLAM 0.5 MG PO TABS
1.0000 mg | ORAL_TABLET | Freq: Once | ORAL | Status: AC
  Administered 2017-06-27: 1 mg via ORAL
  Filled 2017-06-27: qty 2
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
## 2017-06-27 NOTE — Discharge Instructions (Signed)
Blood Transfusion, Care After This sheet gives you information about how to care for yourself after your procedure. Your doctor may also give you more specific instructions. If you have problems or questions, contact your doctor. Follow these instructions at home:  Take over-the-counter and prescription medicines only as told by your doctor.  Go back to your normal activities as told by your doctor.  Follow instructions from your doctor about how to take care of the area where an IV tube was put into your vein (insertion site). Make sure you: ? Wash your hands with soap and water before you change your bandage (dressing). If there is no soap and water, use hand sanitizer. ? Change your bandage as told by your doctor.  Check your IV insertion site every day for signs of infection. Check for: ? More redness, swelling, or pain. ? More fluid or blood. ? Warmth. ? Pus or a bad smell. Contact a doctor if:  You have more redness, swelling, or pain around the IV insertion site..  You have more fluid or blood coming from the IV insertion site.  Your IV insertion site feels warm to the touch.  You have pus or a bad smell coming from the IV insertion site.  Your pee (urine) turns pink, red, or brown.  You feel weak after doing your normal activities. Get help right away if:  You have signs of a serious allergic or body defense (immune) system reaction, including: ? Itchiness. ? Hives. ? Trouble breathing. ? Anxiety. ? Pain in your chest or lower back. ? Fever, flushing, and chills. ? Fast pulse. ? Rash. ? Watery poop (diarrhea). ? Throwing up (vomiting). ? Dark pee. ? Serious headache. ? Dizziness. ? Stiff neck. ? Yellow color in your face or the white parts of your eyes (jaundice). Summary  After a blood transfusion, return to your normal activities as told by your doctor.  Every day, check for signs of infection where the IV tube was put into your vein.  Some signs of  infection are warm skin, more redness and pain, more fluid or blood, and pus or a bad smell where the needle went in.  Contact your doctor if you feel weak or have any unusual symptoms. This information is not intended to replace advice given to you by your health care provider. Make sure you discuss any questions you have with your health care provider. Document Released: 09/02/2014 Document Revised: 04/05/2016 Document Reviewed: 04/05/2016 Elsevier Interactive Patient Education  2017 Else[employer] Inc.   Pancytopenia Pancytopenia is a condition in which there is an abnormally low amount (deficiency) of the following blood cells:  Red blood cells (RBCs). Having too few RBCs is called anemia.  White blood cells (WBCs). Having too few WBCs is called leukopenia.  Cells that help the blood clot (platelets). Having too few platelets is called thrombocytopenia.  Cells that become blood cells (stem cells) are made in the soft tissue inside the bones (bone marrow). All blood cells have a limited lifespan. Blood cells are constantly replaced with new blood cells from the bone marrow. Pancytopenia can be caused by any condition or disease that:  Destroys the ability of bone marrow to make blood cells.  Causes bone marrow to make blood cellsthat cannot survive after they leave the bone marrow.  What are the causes? There are many possible causes of this condition. In some cases, the cause is not known. Common causes include:  A disease that causes bone marrow to make  immature blood cells (megaloblastic anemia).  A blood disorder that makes bone marrow unable to produce enough new RBCs (aplastic anemia or bone marrow failure).  An enlarged spleen (hypersplenism). An enlarged spleen can trap blood cells and destroy them faster than they can be replaced.  Inherited diseases of the blood or bone marrow.  Cancers that affect bone marrow.  Certain medicines, such as: ? Chemotherapy. ? Medicines  that reduce the activity of the immune system (immunosuppressantmedicines).  Exposure to radiation.  Severe infections.  What increases the risk? The following factors may make you more likely to develop this condition:  Being 54?74 years old.  Being a man.  Having a family history of a blood or bone marrow disease.  Having certain conditions, such as: ? Alcohol use disorder. ? HIV (human immunodeficiency virus) or AIDS (acquired immunodeficiency syndrome). ? Cancer. ? Conditions in which the bodys disease-fighting system attacks normal tissues (autoimmune diseases).  What are the signs or symptoms? Symptoms of this condition vary depending on the cause and may include:  Anemia.  Weakness.  Shortness of breath.  Unusual bruising and bleeding.  Frequent infections.  Fatigue.  Fever.  Pale skin.  Bone pain.  Night sweats.  Weight loss.  Headache.  Dizziness.  Feeling unusually cold.  How is this diagnosed? This condition may be diagnosed based on:  Your symptoms.  Your medical history.  A physical exam.  Removal of a sample of bone marrow to be examined under a microscope (biopsy). This is done by inserting a needle into a bone to remove fluid and cells (aspiration).  A complete blood count (CBC). This is a group of tests that measures characteristics of WBCs, RBCs, and platelets.  A peripheral blood smear. This test examines your blood under a microscope to provide information about drugs and diseases that affect RBCs, WBCs, and platelets.  Reticulocyte count. This is a test that measures the amount of new or immature RBCs (reticulocytes) that are made by your bone marrow.  Imaging studies of your spleen or liver, such as X-rays.  A test to measure your vitamin B12 level.  Tests for viruses.  How is this treated? Treatment for this condition depends on the cause. Treatment may include:  Immunosuppressant medicines.  Antibiotic  medicine.  Vitamin B12. This may be given as a treatment for megaloblastic anemia.  Medicines that help the bone marrow make blood cells (bone marrow stimulating drugs).  A bone marrow transplant.  Receiving donated blood through an IV tube (blood transfusion).  A procedure to remove your spleen (splenectomy). This may be done as a treatment for hypersplenism.  Follow these instructions at home: Caring for Your Body   Wash your hands often with soap and water. If soap and water are not available, use hand sanitizer.  Brush your teeth twice a day, and floss at least once a day. It is recommended that you visit the dentist every six months.  Stay up to date on your vaccinations, including a yearly (annual) flu shot. Ask your health care provider which vaccines you should get, such as a vaccine against pneumonia. Medicines  Take over-the-counter and prescription medicines only as told by your health care provider.  If you were prescribed an antibiotic medicine, take it as told by your health care provider. Do not stop taking the antibiotic even if you start to feel better. General instructions  Work with your health care provider to manage your condition and educate yourself about your condition.  Do  not participate in contact sports or dangerous activities. Ask your health care provider what activities are safe for you.  Follow food safety recommendations as told by your health care provider.  During cold and flu season, avoid crowded places and avoid contact with people who are sick. Flu season is typically between the months of October and May.  Keep all follow-up visits as told by your health care provider. This is important. Contact a health care provider if:  You have a fever.  You bruise or bleed easily.  You are dizzy.  You feel unusually weak or tired. Get help right away if:  You have bleeding that does not stop.  You have wheezing or shortness of  breath.  You have chest pain. These symptoms may represent a serious problem that is an emergency. Do not wait to see if the symptoms will go away. Get medical help right away. Call your local emergency services (911 in the [place] Do not drive yourself to the hospital. This information is not intended to replace advice given to you by your health care provider. Make sure you discuss any questions you have with your health care provider. Document Released: 09/08/2015 Document Revised: 01/18/2016 Document Reviewed: 09/07/2014 Elsevier Interactive Patient Education  [employer].

## 2017-06-27 NOTE — Care Management CC44 (Signed)
Condition Code 44 Documentation Completed  Patient Details  Name: [name] MRN: [id number] Date of Birth: 10-Aug-1941   Condition Code 44 given:  Yes Patient signature on Condition Code 44 notice:  Yes Documentation of 2 MD's agreement:  Yes Code 44 added to claim:  Yes    Sharin Mons, RN 06/27/2017, 3:50 PM

## 2017-06-27 NOTE — Discharge Summary (Signed)
Discharge Summary  [name] JJK:[id number] DOB: 1940/11/23  PCP: Garwin Brothers, MD  Admit date: 06/26/2017 Discharge date: 06/27/2017  Time spent: 25 minutes  Recommendations for Outpatient Follow-up:  1. Repeat CBC in 48 hours 2. Follow up PCP, cardiology for bradycardia or low heart rate 3. Follow up with PCP within a week 4. Follow up with GI within a week 5. Take your medications as prescribed  Discharge Diagnoses:  Active Hospital Problems   Diagnosis Date Noted  . Symptomatic anemia 05/15/2017  . Diabetes ([place]) 03/01/2016  . COPD with chronic bronchitis ([place]) 02/19/2016  . Chronic hypoxemic respiratory failure ([place]) 02/19/2016  . Liver cirrhosis secondary to NASH ([place])   . DNR (do not resuscitate) 11/13/2015  . Hepatic cirrhosis ([place]) 11/18/2013  . CKD stage 3 secondary to diabetes ([place]) 11/18/2013  . Physical deconditioning 05/25/2013  . Other pancytopenia ([place]) 01/19/2013  . 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 Coronary atherosclerosis 02/12/2008  . Severe recurrent major depressive disorder with psychotic features ([place]) 11/09/2007  . Essential hypertension 06/25/2007    Resolved Hospital Problems   Diagnosis Date Noted Date Resolved  No resolved problems to display.    Discharge Condition: Stable   Diet recommendation: Resume previous diet  Vitals:   06/27/17 0956 06/27/17 1040  BP:  (!) 132/48  Pulse:  (!) 53  Resp:    Temp:    SpO2: 93%     History of present illness:  [name] is a 76 y.o. female with history of cirrhosis due to NASH, diastolic CHF, IDDM, seizure d/o, anxiety/ depression, COPD, hepatic enceph, GIB, GAVE, anemia who presents to ED [facility] on 06/26/17 sent for low Hb measured at her SNF, [facility].  Has been receiving blood transfusions a lot over the last 2 months, was discharged with a stomach condition and is getting "ablation" procedure per her GI doctor.  This according to pts daughter.  In ED  the Hb was 6.4. Post 2 U PRBCs Hg at 8.4. Patient denies melena or hematochezia. She also denies nausea, abd pain, dizziness, chest pain, palpitations, or dyspnea.  On the day of discharge the patient was hemodynamically stable. To note, patient had an episode of bradycardia while resting in the bed. Patient was asymptomatic and BP was in the 371'I systolically and 96'V diastolically. 12 leads EKG reviewed by self revealed sinus bradycardia rate 52 and 1st degree AV block. Patient will need to follow up with her PCP or cardiology as outpatient to monitor heart rate.   Hospital Course:  Principal Problem:   Symptomatic anemia Active Problems:   Severe recurrent major depressive disorder with psychotic features ([place])   Essential hypertension   Coronary atherosclerosis   GAVE (gastric antral vascular ectasia)   Other pancytopenia (HCC)   Physical deconditioning   CKD stage 3 secondary to diabetes ([place])   Hepatic cirrhosis (HCC)   DNR (do not resuscitate)   Liver cirrhosis secondary to NASH ([place])   COPD with chronic bronchitis ([place])   Chronic hypoxemic respiratory failure ([place])   Diabetes ([place])  1. Symptomatic anemia - cirrhosis and hx GAVE.  PT's GI doctor is planning RFA treatments for this gastric lesion.  If it doesn't help reduce tranfusion needs, family is considering going back to hospice.  2. Resume procrit.  3. 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 Hepatic encephalopathy - on lactulose/ rifaximin, compensated  5. Diast CHF chronic - torsemide. 6. CKD 3 - baseline creat 1.2- 1.6 7.  Depression w psychotic features - stable 8. Pancytopenia - prob due to cirrhosis. 9. Seizure d/o - cont valproic acid 10. COPD on home O2 - cont nebs    Procedures:  None  Consultations:  None; follows with GI as outpatient; last procedure upper endoscopy on 06/18/17.  Discharge Exam: BP (!) 132/48 (BP Location: Left Arm)   Pulse (!) 53   Temp 98.3 F (36.8 C) (Oral)   Resp 20    Ht 5\' 2"  (1.575 m)   Wt 82.6 kg (182 lb)   SpO2 93%   BMI 33.29 kg/m   General: 76 yo CF obese, laying in bed in no acute distress Cardiovascular: RRR with no murmurs, rubs or gallops Respiratory: CTA with no wheezes or rhonchi.  Discharge Instructions You were cared for by a hospitalist during your hospital stay. If you have any questions about your discharge medications or the care you received while you were in the hospital after you are discharged, you can call the unit and asked to speak with the hospitalist on call if the hospitalist that took care of you is not available. Once you are discharged, your primary care physician will handle any further medical issues. Please note that NO REFILLS for any discharge medications will be authorized once you are discharged, as it is imperative that you return to your primary care physician (or establish a relationship with a primary care physician if you do not have one) for your aftercare needs so that they can reassess your need for medications and monitor your lab values.   Allergies as of 06/27/2017      Reactions   Acetaminophen Other (See Comments)   Liver dysfunction   Nsaids Other (See Comments)   Liver dysfunction    Aspirin Other (See Comments)   Causes nosebleeds   Theophylline Nausea And Vomiting   Penicillin G Itching   Penicillins Itching, Other (See Comments)   Has patient had a PCN reaction causing immediate rash, facial/tongue/throat swelling, SOB or lightheadedness with hypotension: unknown Has patient had a PCN reaction causing severe rash involving mucus membranes or skin necrosis: unknown Has patient had a PCN reaction that required hospitalization unknown Has patient had a PCN reaction occurring within the last 10 years: yes If all of the above answers are "NO", then may proceed with Cephalosporin use.      Medication List    STOP taking these medications   HUMALOG KWIKPEN 100 UNIT/ML KiwkPen Generic drug:   insulin lispro   ipratropium-albuterol 0.5-2.5 (3) MG/3ML Soln Commonly known as:  DUONEB     TAKE these medications   ADVAIR DISKUS 250-50 MCG/DOSE Aepb Generic drug:  Fluticasone-Salmeterol INHALE 1 PUFF INTO LUNGS TWICE A DAY   albuterol (2.5 MG/3ML) 0.083% nebulizer solution Commonly known as:  PROVENTIL Take 2.5 mg by nebulization every 6 (six) hours as needed for wheezing or shortness of breath.   ALPRAZolam 1 MG tablet Commonly known as:  XANAX Take 1 mg by mouth 2 (two) times daily as needed for anxiety.   atorvastatin 20 MG tablet Commonly known as:  LIPITOR Take 20 mg by mouth daily.   buPROPion 150 MG 24 hr tablet Commonly known as:  WELLBUTRIN XL Take 150 mg by mouth daily.   DEPAKENE 250 MG capsule Generic drug:  valproic acid Take 250 mg by mouth 2 (two) times daily.   dicyclomine 20 MG tablet Commonly known as:  BENTYL Take 1 tablet (20 mg total) by mouth 2 (two) times daily.  ferrous gluconate 324 MG tablet Commonly known as:  FERGON Take 324 mg by mouth daily with breakfast.   isosorbide mononitrate 30 MG 24 hr tablet Commonly known as:  IMDUR Take 1 tablet (30 mg total) by mouth daily.   lactulose 10 GM/15ML solution Commonly known as:  CHRONULAC Take 10 g by mouth 4 (four) times daily.   LANTUS SOLOSTAR 100 UNIT/ML Solostar Pen Generic drug:  Insulin Glargine Inject 60 Units into the skin daily at 10 pm.   levalbuterol 0.63 MG/3ML nebulizer solution Commonly known as:  XOPENEX Take 0.63 mg by nebulization 2 (two) times daily.   metolazone 2.5 MG tablet Commonly known as:  ZAROXOLYN Take 2.5 mg by mouth daily. Take 2.5 mg by mouth daily as needed for weight gain 4 lbs in 24 hours   multivitamin with minerals Tabs tablet Take 1 tablet by mouth daily.   nitroGLYCERIN 0.4 MG SL tablet Commonly known as:  NITROSTAT Place 1 tablet (0.4 mg total) under the tongue every 5 (five) minutes as needed for chest pain. No more than 3 in 15  minutes   oxyCODONE 5 MG immediate release tablet Commonly known as:  Oxy IR/ROXICODONE Take 1 tablet (5 mg total) by mouth every 6 (six) hours as needed for moderate pain or severe pain.   OXYGEN Inhale 2 L into the lungs continuous.   pantoprazole 40 MG tablet Commonly known as:  PROTONIX TAKE 1 TABLET BY MOUTH TWICE A DAY   potassium chloride SA 20 MEQ tablet Commonly known as:  K-DUR,KLOR-CON Take 20 mEq by mouth daily.   promethazine 12.5 MG tablet Commonly known as:  PHENERGAN Take 12.5 mg by mouth every 12 (twelve) hours.   QUEtiapine 100 MG tablet Commonly known as:  SEROQUEL Take 100 mg by mouth at bedtime.   rifaximin 550 MG Tabs tablet Commonly known as:  XIFAXAN Take 550 mg by mouth 2 (two) times daily.   rOPINIRole 4 MG tablet Commonly known as:  REQUIP Take 4 mg by mouth at bedtime.   sucralfate 1 GM/10ML suspension Commonly known as:  CARAFATE Take 1 g by mouth every 6 (six) hours as needed.   SYSTANE BALANCE 0.6 % Soln Generic drug:  Propylene Glycol Place 1 drop into both eyes every morning.   torsemide 20 MG tablet Commonly known as:  DEMADEX Take 40 mg by mouth daily.   traMADol 50 MG tablet Commonly known as:  ULTRAM Take 1 tablet (50 mg total) by mouth 2 (two) times daily. Not to be given with Xanax.      Allergies  Allergen Reactions  . Acetaminophen Other (See Comments)    Liver dysfunction  . Nsaids Other (See Comments)    Liver dysfunction   . Aspirin Other (See Comments)    Causes nosebleeds  . Theophylline Nausea And Vomiting  . Penicillin G Itching  . Penicillins Itching and Other (See Comments)    Has patient had a PCN reaction causing immediate rash, facial/tongue/throat swelling, SOB or lightheadedness with hypotension: unknown Has patient had a PCN reaction causing severe rash involving mucus membranes or skin necrosis: unknown Has patient had a PCN reaction that required hospitalization unknown Has patient had a PCN  reaction occurring within the last 10 years: yes If all of the above answers are "NO", then may proceed with Cephalosporin use.    Follow-up Information    Garwin Brothers, MD Follow up.   Specialty:  Internal Medicine Contact information: [address]  [place] [address] [phone]            The results of significant diagnostics from this hospitalization (including imaging, microbiology, ancillary and laboratory) are listed below for reference.    Significant Diagnostic Studies: Dg Chest 2 View  Result Date: 06/13/2017 CLINICAL DATA:  Shortness breath, chest pain EXAM: CHEST  2 VIEW COMPARISON:  05/30/2017 FINDINGS: The heart size and mediastinal contours are within normal limits. Both lungs are clear. The visualized skeletal structures are unremarkable. IMPRESSION: No active cardiopulmonary disease. Electronically Signed   By: Kathreen Devoid   On: 06/13/2017 20:10   Dg Chest 2 View  Result Date: 05/30/2017 CLINICAL DATA:  Initial evaluation for acute shortness of breath. EXAM: CHEST  2 VIEW COMPARISON:  Prior radiograph from 07/08/2016. FINDINGS: Mild cardiomegaly. Mediastinal silhouette normal. Aortic atherosclerosis. Lungs hypoinflated. Streaky left perihilar and basilar atelectasis and/ or scarring. No focal infiltrates. No pulmonary edema or pleural effusion. No pneumothorax. No acute osseus abnormality. IMPRESSION: 1. Mild left perihilar and basilar atelectasis and/or scarring. 2. No other active cardiopulmonary disease. 3. Mild cardiomegaly. 4. Aortic atherosclerosis. Electronically Signed   By: Jeannine Boga M.D.   On: 05/30/2017 16:25    Microbiology: Recent Results (from the past 240 hour(s))  MRSA PCR Screening     Status: None   Collection Time: 06/26/17  9:43 PM  Result Value Ref Range Status   MRSA by PCR NEGATIVE NEGATIVE Final    Comment:        The GeneXpert MRSA Assay (FDA approved for NASAL specimens only), is one component of a comprehensive  MRSA colonization surveillance program. It is not intended to diagnose MRSA infection nor to guide or monitor treatment for MRSA infections.      Labs: Basic Metabolic Panel:  Recent Labs Lab 06/23/17 06/26/17 1634 06/27/17 0643  NA 142 141 140  K 3.1* 3.1* 3.0*  CL  --  104 102  CO2  --  28 30  GLUCOSE  --  215* 136*  BUN 31* 33* 29*  CREATININE 1.3* 1.64* 1.40*  CALCIUM  --  8.3* 8.5*   Liver Function Tests:  Recent Labs Lab 06/23/17 06/26/17 1634  AST 31 33  ALT 24 23  ALKPHOS 129* 112  BILITOT  --  1.0  PROT  --  4.3*  ALBUMIN  --  2.2*   No results for input(s): LIPASE, AMYLASE in the last 168 hours.  Recent Labs Lab 06/26/17 1729  AMMONIA 59*   CBC:  Recent Labs Lab 06/23/17 06/26/17 1634 06/27/17 0643  WBC 2.3 2.7* 4.0  NEUTROABS 2  --   --   HGB 7.1* 6.4* 8.4*  HCT 22* 20.4* 26.2*  MCV  --  99.5 95.3  PLT 77* 83* 77*   Cardiac Enzymes: No results for input(s): CKTOTAL, CKMB, CKMBINDEX, TROPONINI in the last 168 hours. BNP: BNP (last 3 results)  Recent Labs  06/13/17 1826  BNP 78.8    ProBNP (last 3 results) No results for input(s): PROBNP in the last 8760 hours.  CBG:  Recent Labs Lab 06/26/17 2121 06/27/17 0731 06/27/17 1243  GLUCAP 223* 129* 142*       Signed:  Kayleen Memos, MD [facility] 06/27/2017, 3:35 PM

## 2017-06-27 NOTE — Care Management Obs Status (Signed)
[facility] NOTIFICATION   Patient Details  Name: [name] MRN: [id number] Date of Birth: 04-Mar-1941   Medicare Observation Status Notification Given:  Yes    Sharin Mons, RN 06/27/2017, 3:50 PM

## 2017-06-27 NOTE — Progress Notes (Addendum)
Patient will DC to: [facility] Anticipated DC date: 06/27/17 Family notified: Left vm for daughter, patient oriented and also left vm for daughter Transport by: Corey Harold   Per MD patient ready for DC to [facility]. RN, patient, patient's family, and facility notified of DC. Discharge Summary sent to facility. RN given number for report [phone]). DC packet on chart. Ambulance transport requested for patient.   CSW signing off.  Cedric Fishman, [place] Social Worker [phone]

## 2017-06-27 NOTE — Progress Notes (Signed)
Pt's family would like to have pt's diet changed to Heart healthy/Carb Modified diet. NP on call notified. Will continue to monitor.

## 2017-06-27 NOTE — Progress Notes (Signed)
Pt's heart rate dropped as low as 43 & 39 in the last few minutes.Pt was sleeping.HR came back up when pt was awoken. MD notified. Care transferred to day RN.

## 2017-06-27 NOTE — Clinical Social Work Note (Signed)
Clinical Social Work Assessment  Patient Details  Name: [name] MRN: [id number] Date of Birth: 08/12/1941  Date of referral:  06/27/17               Reason for consult:  Discharge Planning                Permission sought to share information with:  Facility Sport and exercise psychologist, Family Supports Permission granted to share information::  Yes, Verbal Permission Granted  Name::        Agency::  [facility]  Relationship::  Daughter  Contact Information:     Housing/Transportation Living arrangements for the past 2 months:  [place] of Information:  Patient Patient Interpreter Needed:  None Criminal Activity/Legal Involvement Pertinent to Current Situation/Hospitalization:  No - Comment as needed Significant Relationships:  Adult Children Lives with:  Self Do you feel safe going back to the place where you live?  No Need for family participation in patient care:  Yes (Comment)  Care giving concerns:  CSW received consult for possible return to SNF placement at time of discharge.  Patient plans on returning to [place] at discharge. CSW to continue to follow and assist with discharge planning needs.   Social Worker assessment / plan:  CSW spoke with patient concerning return to [facility] and left a voicemail for patient's daughter.  Employment status:  Retired Nurse, adult PT Recommendations:  Not assessed at this time Information / Referral to community resources:  [place]  Patient/Family's Response to care:  Patient expresses agreement with discharge to [place] [facility] [place] by [employer].  Patient/Family's Understanding of and Emotional Response to Diagnosis, Current Treatment, and Prognosis:  Patient/family is realistic regarding therapy needs and expressed being hopeful for return to SNF placement. Patient expressed understanding of CSW role and discharge process as well as medical condition. No questions/concerns about  plan or treatment.    Emotional Assessment Appearance:  Appears stated age Attitude/Demeanor/Rapport:  Other (Appropriate) Affect (typically observed):  Accepting, Appropriate Orientation:  Oriented to Self, Oriented to Place, Oriented to Situation Alcohol / Substance use:  Not Applicable Psych involvement (Current and /or in the community):  No (Comment)  Discharge Needs  Concerns to be addressed:  Care Coordination Readmission within the last 30 days:  Yes Current discharge risk:  None Barriers to Discharge:  No Barriers Identified   [name], LCSWA 06/27/2017, 4:23 PM

## 2017-06-27 NOTE — Progress Notes (Signed)
Pt arrived floor from [facility] in the company of daughter and [employer]er[employer]. Pt was assessed to be AxOx3. No complaints of pain. Pt had blood transfusing on arrival. Skin assessment was completed and pt was transferred to low bed for high fall risk and bleeding precaution. Vital signs completed and telemetry was initiated. Will continue to monitor.

## 2017-06-29 LAB — TYPE AND SCREEN
ABO/RH(D): O POS
ANTIBODY SCREEN: NEGATIVE
DONOR AG TYPE: NEGATIVE
Donor AG Type: NEGATIVE
Donor AG Type: NEGATIVE
Donor AG Type: NEGATIVE
UNIT DIVISION: 0
UNIT DIVISION: 0
UNIT DIVISION: 0
Unit division: 0

## 2017-06-29 LAB — BPAM RBC
BLOOD PRODUCT EXPIRATION DATE: 201811262359
BLOOD PRODUCT EXPIRATION DATE: 201812012359
Blood Product Expiration Date: 201811292359
Blood Product Expiration Date: 201812012359
ISSUE DATE / TIME: 201811011917
ISSUE DATE / TIME: 201811020123
UNIT TYPE AND RH: 5100
UNIT TYPE AND RH: 5100
Unit Type and Rh: 5100
Unit Type and Rh: 5100

## 2017-07-01 ENCOUNTER — Non-Acute Institutional Stay (SKILLED_NURSING_FACILITY): Payer: Medicare Other | Admitting: Internal Medicine

## 2017-07-01 ENCOUNTER — Encounter: Payer: Self-pay | Admitting: Internal Medicine

## 2017-07-01 DIAGNOSIS — K31819 Angiodysplasia of stomach and duodenum without bleeding: Secondary | ICD-10-CM | POA: Diagnosis not present

## 2017-07-01 DIAGNOSIS — D500 Iron deficiency anemia secondary to blood loss (chronic): Secondary | ICD-10-CM | POA: Diagnosis not present

## 2017-07-01 DIAGNOSIS — K7682 Hepatic encephalopathy: Secondary | ICD-10-CM

## 2017-07-01 DIAGNOSIS — K7200 Acute and subacute hepatic failure without coma: Secondary | ICD-10-CM

## 2017-07-01 NOTE — Assessment & Plan Note (Addendum)
07/04/17 Dr. Havery Moros will review any indication for repeating RFA

## 2017-07-01 NOTE — Assessment & Plan Note (Addendum)
07/01/17 Aranesp reinitiated every 2 weeks Continue transfusion guidelines above Possibly repeating RFA will be revisited @ 07/03/17 GI appt

## 2017-07-01 NOTE — Progress Notes (Addendum)
NURSING HOME LOCATION:  [facility] ROOM NUMBER:  [id number]  CODE STATUS:  DNR  PCP:  Garwin Brothers, MD [place] [id number]    This is a nursing facility follow up for [place] readmission within 30 days  Interim medical record and care since last [place] visit was updated with review of diagnostic studies and change in clinical status since last visit were documented.  HPI: Patient was hospitalized 11/1-11/2/18 for symptomatic anemia. Hemoglobin was 6.4, it rose to 8.4 after 2 units of red cells.   The patient had previously been on Procrit. It was discontinued and exact reason is unclear. The patient and her daughter stated it was stopped as " it was not working". Hospital records suggest it was stopped when she was on Hospice. Apparently Hospice will authorize  transfusions but not Procrit. Dr. Havery Moros is possibly considering repeating RFA of the vascular gastric lesions.  Procrit will be administered every 2 weeks.  Hospitalization was complicated by an episode of bradycardia which was asymptomatic without hypotension.Heart rate was 52. Patient remains a DO NOT RESUSCITATE, but wants to continue addressing the anemia and any acute changes in mental status. Her daughters maintain that her mental status deteriorates if anemia worsens significantly; she has urinary tract infection; or if her ammonia level goes up. On 11/1 ammonia level performed by [facility] was 153. Ammonia level later the same day in the [facility]  ED was [place] will be reconsidered if possible repeat RFA procedure and resuming Procrit do not reduce transfusion needs.   Review of systems: Dementia impacts validity of responses. She states that she was having vaginal bleeding prior to the hospitalization, but this has not recurred. Her daughter denies this. The patient also describes intermittent minor epistaxis. She denies any other bleeding dyscrasias although stool has been  dark.  The patient gave the date as November 7 or 8, she knew there was an election being conducted. She gave her daughter's birth date as [birth year] but it is the 18th.  Physical exam:  Pertinent or positive findings: She is alert but mentally confused as noted. She is edentulous. Grade 1.5 systolic murmur is present. Abdomen is protuberant but soft. Pedal pulses are decreased. She has 1/2+ edema.  General appearance:Adequately nourished; no acute distress , increased work of breathing is present.   Lymphatic: No lymphadenopathy about the head, neck, axilla . Eyes: No conjunctival inflammation or lid edema is present. There is no scleral icterus. Ears:  External ear exam shows no significant lesions or deformities.   Nose:  External nasal examination shows no deformity or inflammation. Nasal mucosa are pink and moist without lesions ,exudates Oral exam: lips and gums are healthy appearing.There is no oropharyngeal erythema or exudate . Neck:  No thyromegaly, masses, tenderness noted.    Heart:  Normal rate and regular rhythm. S1 and S2 normal without gallop, click, rub .  Lungs: without wheezes, rhonchi,rales , rubs. Abdomen:Bowel sounds are normal. Abdomen is soft and nontender with no organomegaly, hernias,masses. GU: deferred  Extremities:  No cyanosis, clubbing  Neurologic exam : Balance,Rhomberg,finger to nose testing could not be completed due to clinical state Skin: Warm & dry w/o tenting. No significant lesions or rash.  See summary under each active problem in the Problem List with associated updated therapeutic plan  NOTE: 07/02/17 hemoglobin 7.0, hemoglobin 8.4 on 11/2 after 2 u PRBC . Aranesp 100 mg every 2 weeks has not been restarted.[name]  GI follow-up scheduled for 11/8, based on present anemia protocol, she is to be transfused if the hemoglobin is less than 8. Unfortunately we will need to transfer her to the [facility] ED for transfusion. Hopefully she can also receive a dose of  Aranesp with repeat every 2 weeks thereafter based on hemoglobin every 2 weeks.

## 2017-07-01 NOTE — Assessment & Plan Note (Addendum)
07/01/17 GI will be asked to provide specific guidelines for checking and addressing the ammonia levels. Her daughters feel this is a valuable tool in assessment of any mental status changes 06/26/17 [facility] ammonia level 153; ammonia level the same day in the [facility] ED was 59

## 2017-07-01 NOTE — Patient Instructions (Addendum)
See assessment and plan under each diagnosis in the problem list and acutely for this visit Total time 32  minutes; greater than 50% of the visit spent counseling patient and daughters and coordinating care for problems addressed at this encounter

## 2017-07-02 ENCOUNTER — Emergency Department (HOSPITAL_COMMUNITY)
Admission: EM | Admit: 2017-07-02 | Discharge: 2017-07-03 | Disposition: A | Discharge status: Home / Self Care | Payer: Medicare Other | Attending: Emergency Medicine | Admitting: Emergency Medicine

## 2017-07-02 ENCOUNTER — Non-Acute Institutional Stay (SKILLED_NURSING_FACILITY): Payer: Medicare Other | Admitting: Internal Medicine

## 2017-07-02 ENCOUNTER — Encounter (HOSPITAL_COMMUNITY): Payer: Self-pay

## 2017-07-02 ENCOUNTER — Encounter: Payer: Self-pay | Admitting: Internal Medicine

## 2017-07-02 ENCOUNTER — Emergency Department (HOSPITAL_COMMUNITY): Payer: Medicare Other

## 2017-07-02 DIAGNOSIS — Z794 Long term (current) use of insulin: Secondary | ICD-10-CM | POA: Insufficient documentation

## 2017-07-02 DIAGNOSIS — Z87891 Personal history of nicotine dependence: Secondary | ICD-10-CM | POA: Insufficient documentation

## 2017-07-02 DIAGNOSIS — Z79899 Other long term (current) drug therapy: Secondary | ICD-10-CM | POA: Diagnosis not present

## 2017-07-02 DIAGNOSIS — N3000 Acute cystitis without hematuria: Secondary | ICD-10-CM | POA: Diagnosis not present

## 2017-07-02 DIAGNOSIS — N183 Chronic kidney disease, stage 3 (moderate): Secondary | ICD-10-CM | POA: Diagnosis not present

## 2017-07-02 DIAGNOSIS — I130 Hypertensive heart and chronic kidney disease with heart failure and stage 1 through stage 4 chronic kidney disease, or unspecified chronic kidney disease: Secondary | ICD-10-CM | POA: Diagnosis not present

## 2017-07-02 DIAGNOSIS — J45909 Unspecified asthma, uncomplicated: Secondary | ICD-10-CM | POA: Diagnosis not present

## 2017-07-02 DIAGNOSIS — F329 Major depressive disorder, single episode, unspecified: Secondary | ICD-10-CM | POA: Diagnosis not present

## 2017-07-02 DIAGNOSIS — F419 Anxiety disorder, unspecified: Secondary | ICD-10-CM | POA: Diagnosis not present

## 2017-07-02 DIAGNOSIS — E1122 Type 2 diabetes mellitus with diabetic chronic kidney disease: Secondary | ICD-10-CM | POA: Diagnosis not present

## 2017-07-02 DIAGNOSIS — K31819 Angiodysplasia of stomach and duodenum without bleeding: Secondary | ICD-10-CM

## 2017-07-02 DIAGNOSIS — F209 Schizophrenia, unspecified: Secondary | ICD-10-CM | POA: Insufficient documentation

## 2017-07-02 DIAGNOSIS — Z66 Do not resuscitate: Secondary | ICD-10-CM | POA: Insufficient documentation

## 2017-07-02 DIAGNOSIS — I5032 Chronic diastolic (congestive) heart failure: Secondary | ICD-10-CM | POA: Diagnosis not present

## 2017-07-02 DIAGNOSIS — J449 Chronic obstructive pulmonary disease, unspecified: Secondary | ICD-10-CM | POA: Diagnosis not present

## 2017-07-02 DIAGNOSIS — R509 Fever, unspecified: Secondary | ICD-10-CM

## 2017-07-02 DIAGNOSIS — D61818 Other pancytopenia: Secondary | ICD-10-CM | POA: Diagnosis not present

## 2017-07-02 LAB — CBC
HCT: 25.1 % — ABNORMAL LOW (ref 36.0–46.0)
Hemoglobin: 7.8 g/dL — ABNORMAL LOW (ref 12.0–15.0)
MCH: 30.8 pg (ref 26.0–34.0)
MCHC: 31.1 g/dL (ref 30.0–36.0)
MCV: 99.2 fL (ref 78.0–100.0)
PLATELETS: 76 10*3/uL — AB (ref 150–400)
RBC: 2.53 MIL/uL — AB (ref 3.87–5.11)
RDW: 18.4 % — AB (ref 11.5–15.5)
WBC: 2.7 10*3/uL — AB (ref 4.0–10.5)

## 2017-07-02 LAB — URINALYSIS, ROUTINE W REFLEX MICROSCOPIC
Bilirubin Urine: NEGATIVE
GLUCOSE, UA: NEGATIVE mg/dL
Hgb urine dipstick: NEGATIVE
Ketones, ur: NEGATIVE mg/dL
Nitrite: NEGATIVE
PROTEIN: NEGATIVE mg/dL
SPECIFIC GRAVITY, URINE: 1.01 (ref 1.005–1.030)
SQUAMOUS EPITHELIAL / LPF: NONE SEEN
pH: 6 (ref 5.0–8.0)

## 2017-07-02 LAB — COMPREHENSIVE METABOLIC PANEL
ALK PHOS: 109 U/L (ref 38–126)
ALT: 20 U/L (ref 14–54)
AST: 33 U/L (ref 15–41)
Albumin: 2.5 g/dL — ABNORMAL LOW (ref 3.5–5.0)
Anion gap: 7 (ref 5–15)
BUN: 31 mg/dL — ABNORMAL HIGH (ref 6–20)
CALCIUM: 8.1 mg/dL — AB (ref 8.9–10.3)
CO2: 27 mmol/L (ref 22–32)
CREATININE: 1.39 mg/dL — AB (ref 0.44–1.00)
Chloride: 107 mmol/L (ref 101–111)
GFR, EST AFRICAN AMERICAN: 42 mL/min — AB (ref >60)
GFR, EST NON AFRICAN AMERICAN: 37 mL/min — AB (ref >60)
Glucose, Bld: 156 mg/dL — ABNORMAL HIGH (ref 65–99)
Potassium: 3.5 mmol/L (ref 3.5–5.1)
Sodium: 141 mmol/L (ref 135–145)
Total Bilirubin: 1 mg/dL (ref 0.3–1.2)
Total Protein: 4.7 g/dL — ABNORMAL LOW (ref 6.5–8.1)

## 2017-07-02 MED ORDER — CEFPODOXIME PROXETIL 100 MG PO TABS: 100.0000 mg | ORAL_TABLET | Freq: Two times a day (BID) | ORAL | 0 refills | Status: DC

## 2017-07-02 MED ORDER — CEFPODOXIME PROXETIL 200 MG PO TABS
200.0000 mg | ORAL_TABLET | Freq: Once | ORAL | Status: AC
  Administered 2017-07-02: 200 mg via ORAL
  Filled 2017-07-02: qty 1

## 2017-07-02 MED ORDER — CEFPODOXIME PROXETIL 200 MG PO TABS: 100.0000 mg | ORAL_TABLET | Freq: Two times a day (BID) | ORAL | Status: DC
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
## 2017-07-02 NOTE — ED Notes (Signed)
ED Provider at bedside. 

## 2017-07-02 NOTE — ED Notes (Signed)
This RN attempted to call report to [facility] twice without success

## 2017-07-02 NOTE — Patient Instructions (Addendum)
See assessment and plan under each diagnosis in the problem list and acutely for this visit Initially transfer to the ED was for transfusion only, but of significant concern is the fever with the pancytopenia ( white count 2300 ,down from 4000)

## 2017-07-02 NOTE — Discharge Instructions (Signed)
Your hemoglobin today was slightly decreased but it was improved from the value earlier today.  It was 7.8.  Your urinalysis did show evidence of infection.  Please take the antibiotics for the UTI.  Please go to your appointment tomorrow.  If any symptoms change or worsen, please return to the nearest emergency department.

## 2017-07-02 NOTE — Progress Notes (Signed)
    This is a nursing facility follow up for specific acute issue of progressive pancytopenia.  HPI: The patient was discharged from the hospital 11/2 after receiving 2 units of packed cells. At that time she was found to be hemodynamically stable. Hgb was 8.4. Follow-up CBC was recommended 11/4, I requested that this morning as I found no record of this being performed on that date. Today's CBC reveals that her white count has dropped from 4000-2300. Hemoglobin has dropped from 8.4 to 7, and platelet count has dropped from 77,000 to 65000. Protocols is in place for transfusion if her hemoglobin is less than 8.Aranesp 100 milligrams every 2 weeks is to be initiated. This has not been completed as per 11/2 Discharge Summary.  She has a GI follow-up scheduled for tomorrow 11/8 at Dr. Doyne Keel office. There is a possibility he may consider repeating the RFA for the etiology of the bleeding, gastric antral vascular ectasia.  Review of systems: Encephalopathy invalidated responses. Date given as November 11. She again knew that the election was yesterday. She was unable give her daughter's correct birthdate. She describes abdominal discomfort when she eats. She denies any active bleeding dyscrasias. She has no localizing symptoms for infection despite the new temperature elevation. Staff states patient has had no fever until today.  Physical exam:  Pertinent or positive findings: This patient is somewhat rambling and slightly less coherent today compared to yesterday. She also is exhibiting the rocking back and forth of the upper torso she's exhibited when confused. Breath sounds are decreased. She has a grade 1.5 systolic murmur. Abdomen is protuberant.She has trace pedal edema. Pedal pulses are decreased.  General appearance:Adequately nourished; no acute distress , increased work of breathing is present.   Lymphatic: No lymphadenopathy about the head, neck, axilla . Eyes: No conjunctival  inflammation or lid edema is present. There is no scleral icterus. Ears:  External ear exam shows no significant lesions or deformities.   Nose:  External nasal examination shows no deformity or inflammation. Nasal mucosa are pink and moist without lesions ,exudates Oral exam: lips and gums are healthy appearing.There is no oropharyngeal erythema or exudate . Neck:  No thyromegaly, masses, tenderness noted.    Heart:  Normal rate and regular rhythm. S1 and S2 normal without gallop, click, rub .  Lungs: without wheezes, rhonchi,rales , rubs. Abdomen:Bowel sounds are normal. Abdomen is soft and nontender with no organomegaly, hernias,masses. GU: deferred  Extremities:  No cyanosis, clubbing  Skin: Warm & dry w/o tenting. No significant lesions or rash.  See summary under each active problem in the Problem List with associated updated therapeutic plan

## 2017-07-02 NOTE — Assessment & Plan Note (Signed)
07/03/17 GI follow-up with Dr. Havery Moros

## 2017-07-02 NOTE — Assessment & Plan Note (Signed)
Since 06/27/17 hemoglobin has dropped to 7 from 8.4 following the 2 units of packed cells. This value meets the criteria for repeat transfusion White count has dropped to 2300 and platelet count has decreased to 65,000. The etiology of the progressive pancytopenia is unclear.

## 2017-07-02 NOTE — ED Triage Notes (Signed)
Pt from [facility] with EMS for low Hgb of 7.0, pt recently here for the same and received a blood transfusion. Unknown cause. Pt a.o, pale upon arrival, on 1L [place]. BP 150/60 HR72 O2 99% on 1LNC. Pt denies any pain. nad

## 2017-07-02 NOTE — ED Notes (Signed)
Patient transported to X-ray 

## 2017-07-03 ENCOUNTER — Other Ambulatory Visit: Payer: Self-pay

## 2017-07-03 ENCOUNTER — Encounter: Payer: Self-pay | Admitting: Gastroenterology

## 2017-07-03 ENCOUNTER — Ambulatory Visit (INDEPENDENT_AMBULATORY_CARE_PROVIDER_SITE_OTHER): Payer: Medicare Other | Admitting: Gastroenterology

## 2017-07-03 VITALS — BP 108/42 | HR 76

## 2017-07-03 DIAGNOSIS — D500 Iron deficiency anemia secondary to blood loss (chronic): Secondary | ICD-10-CM | POA: Diagnosis not present

## 2017-07-03 DIAGNOSIS — K31819 Angiodysplasia of stomach and duodenum without bleeding: Secondary | ICD-10-CM

## 2017-07-03 DIAGNOSIS — K7460 Unspecified cirrhosis of liver: Secondary | ICD-10-CM

## 2017-07-03 DIAGNOSIS — K7581 Nonalcoholic steatohepatitis (NASH): Secondary | ICD-10-CM | POA: Diagnosis not present

## 2017-07-03 DIAGNOSIS — D508 Other iron deficiency anemias: Secondary | ICD-10-CM

## 2017-07-03 LAB — TYPE AND SCREEN
ABO/RH(D): O POS
ANTIBODY SCREEN: NEGATIVE
Donor AG Type: NEGATIVE
Donor AG Type: NEGATIVE
Unit division: 0
Unit division: 0

## 2017-07-03 LAB — BPAM RBC
BLOOD PRODUCT EXPIRATION DATE: 201812012359
Blood Product Expiration Date: 201812012359
UNIT TYPE AND RH: 5100
Unit Type and Rh: 5100

## 2017-07-03 NOTE — Progress Notes (Signed)
Agree with assessment and plan as outlined. This is a very difficult situation. Cirrhosis, oxygen dependant COPD, debilitation, with severe chronic anemia secondary to GAVE. She has not responded well to APC in the past. Did well initially with RFA, and then had recurrence. Severe GAVE on recent endoscopy with treatment again with RFA to reduce need for transfusions. She unfortunately needed another transfusion recently.   I agree with IV iron supplementation, defer to Dr. Linna Darner if he feels Aranesp would be useful. Other medical options for this such as octreotide, thalidomide, hormonal therapy have not shown good results. Other than repeating RFA, the other option would be surgical antrectomy for which she is a very poor candidate. The patient is weary of having another EGD, it was tough on her, she is not sure if she wishes to have further transfusions if this progresses.  I spoke with the patient and family, if this latest try at RFA does not provide benefit, does she wish to have further transfusions and potential repeat EGD, or re-enroll in hospice for comfort care. Family is very realistic about her medical problems and hospice is a reasonable option. We will repeat CBC next week, try to infuse IV Iron soon, and await her course.

## 2017-07-03 NOTE — ED Notes (Signed)
Rn tried [employer] x3 with no answer

## 2017-07-03 NOTE — Progress Notes (Signed)
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  She recently underwent RFA on 10/24, which was extensive.  Even since that procedure she has required blood transfusion.  She has been transferred to a new nursing home facility and they need our recommendations on this issue.  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  She is on lactulose 10 g 4 times daily.   Please see Dr. Doyne Keel noted from 06/17/2017 for extensive detail regarding these issues.   Past Medical History:  Diagnosis Date  . Adenomatous colon polyp   . Anxiety   . Asthma   . Back pain   . Benign neoplasm of colon   . CAP (community acquired pneumonia) 2015  . Carpal tunnel syndrome on both sides   . Chronic airway obstruction, not elsewhere classified   . Chronic diastolic CHF (congestive heart failure) ([place])    a. 03/26/2014 Echo: EF 55-60%, no rwma, mild AS/AI, mod-sev Ca2+ MV annulus, mildly to mod dil LA.  [name] Coarse tremors    a. arms.  . Diverticulosis of colon (without mention of hemorrhage)   . Esophageal reflux   . Falls frequently    completed HHPT/OT 06/2014, PT unmet goals  . GAVE (gastric antral vascular ectasia)    angiodysplasia; s/p multiple APCs, discussing RFA with [facility] Dr Newman Pies (06/2015)  . H/O hiatal hernia   . Hepatitis   . Hiatal hernia   . Hyperlipidemia   . Iron deficiency anemia secondary to blood loss (chronic)    a. frequent PRBC transfusions.  . Liver cirrhosis secondary to nonalcoholic steatohepatitis (NASH) ([place])    a. dx'd 1990's  . Major depressive disorder, recurrent episode, severe, specified as with psychotic behavior   . Memory loss   . Midsternal chest pain    a. conservatively managed ->poor candidate for cath/anticoagulation.  . Mild aortic stenosis    a. 03/2014 Valve area (VTI): 2.89 cm^2,  Valve area (Vmax): 2.7 cm^2.  . Morbid obesity ([place])   . Neck pain   . Non-obstructive CAD   . On home oxygen therapy    "3L; 24/7" (11/29/2015)  . OSA (obstructive sleep apnea)    a. does not use CPAP. (01/25/2015)  . Osteoarthrosis, unspecified whether generalized or localized, unspecified site   . Osteoporosis, unspecified   . Palpitations   . Portal hypertensive gastropathy ([place])   . Protein calorie malnutrition ([place])   . Recurrent UTI (urinary tract infection)    h/o hospitalization with urosepsis 2015, but thought large component colonization/bacteriuria, only treat if symptomatic ([place])  . Restless legs syndrome (RLS)   . Schizophrenia ([place])   . Thrombocytopenia ([place])   . Type II diabetes mellitus ([place])   . Unspecified chronic bronchitis ([place])   . Unspecified essential hypertension    Past Surgical History:  Procedure Laterality Date  . APPENDECTOMY    . BREAST BIOPSY     bilateral  . CESAREAN SECTION     x 3  . EUS  09/2015   antral CAVL, ablated; nodular erosive gastritis; mult pancreatic cysts rec rpt CT pancreas protocol or MRI to survey pancreas cysts 1-2 yrs (Dr Amie Critchley)  . HEMORROIDECTOMY    . US ECHOCARDIOGRAPHY  07/2014   mild LVH, EF 60-65%, normal wall motion, mild AR, mod dilated LA, mildly dilated RA, peak PA pressure  49mmHg  . VAGINAL HYSTERECTOMY      reports that she quit smoking about 24 years ago. Her smoking use included cigarettes. She has a 37.50 pack-year smoking history. she has never used smokeless tobacco. She reports that she does not drink alcohol or use drugs. family history includes Breast cancer in her maternal aunt and sister; Cervical cancer in her mother; Diabetes in her father, mother, and sister; Heart attack in her father and mother; Heart disease in her mother; Kidney disease in her mother; Lupus in her daughter; Multiple sclerosis in her other and sister; Stroke in her daughter and daughter. Allergies  Allergen Reactions  .  Acetaminophen Other (See Comments)    Liver dysfunction  . Nsaids Other (See Comments)    Liver dysfunction   . Aspirin Other (See Comments)    Causes nosebleeds  . Theophylline Nausea And Vomiting  . Penicillin G Itching  . Penicillins Itching and Other (See Comments)    Has patient had a PCN reaction causing immediate rash, facial/tongue/throat swelling, SOB or lightheadedness with hypotension: unknown Has patient had a PCN reaction causing severe rash involving mucus membranes or skin necrosis: unknown Has patient had a PCN reaction that required hospitalization unknown Has patient had a PCN reaction occurring within the last 10 years: yes If all of the above answers are "NO", then may proceed with Cephalosporin use.       Outpatient Encounter Medications as of 07/03/2017  Medication Sig  . ADVAIR DISKUS 250-50 MCG/DOSE AEPB INHALE 1 PUFF INTO LUNGS TWICE A DAY  . albuterol (PROVENTIL) (2.5 MG/3ML) 0.083% nebulizer solution Take 2.5 mg by nebulization every 6 (six) hours as needed for wheezing or shortness of breath.  . ALPRAZolam (XANAX) 0.5 MG tablet Take 0.5 mg 2 (two) times daily as needed by mouth for anxiety.  [name] atorvastatin (LIPITOR) 20 MG tablet Take 20 mg by mouth daily.   [name] buPROPion (WELLBUTRIN XL) 150 MG 24 hr tablet Take 150 mg by mouth daily.  . cefpodoxime (VANTIN) 100 MG tablet Take 1 tablet (100 mg total) 2 (two) times daily for 7 days by mouth.  . dicyclomine (BENTYL) 20 MG tablet Take 1 tablet (20 mg total) by mouth 2 (two) times daily.  . ferrous gluconate (FERGON) 324 MG tablet Take 324 mg by mouth daily with breakfast.   . Insulin Glargine (LANTUS SOLOSTAR) 100 UNIT/ML Solostar Pen Inject 60 Units into the skin daily at 10 pm.   . isosorbide mononitrate (IMDUR) 30 MG 24 hr tablet Take 1 tablet (30 mg total) by mouth daily.  [name] lactulose (CHRONULAC) 10 GM/15ML solution Take 10 g by mouth 4 (four) times daily.  [name] levalbuterol (XOPENEX) 0.63 MG/3ML nebulizer  solution Take 0.63 mg by nebulization 2 (two) times daily.   . Multiple Vitamin (MULTIVITAMIN WITH MINERALS) TABS tablet Take 1 tablet by mouth daily.   . nitroGLYCERIN (NITROSTAT) 0.4 MG SL tablet Place 1 tablet (0.4 mg total) under the tongue every 5 (five) minutes as needed for chest pain. No more than 3 in 15 minutes  . oxyCODONE (OXY IR/ROXICODONE) 5 MG immediate release tablet Take 1 tablet (5 mg total) by mouth every 6 (six) hours as needed for moderate pain or severe pain.  . OXYGEN Inhale 2 L into the lungs continuous.   . pantoprazole (PROTONIX) 40 MG tablet TAKE 1 TABLET BY MOUTH TWICE A DAY (Patient taking differently: TAKE 20MG  BY MOUTH TWICE A DAY)  . potassium chloride SA (K-DUR,KLOR-CON) 20 MEQ tablet Take  20 mEq by mouth daily.  . promethazine (PHENERGAN) 12.5 MG tablet Take 12.5 mg by mouth every 12 (twelve) hours.  . Propylene Glycol (SYSTANE BALANCE) 0.6 % SOLN Place 1 drop into both eyes every morning.  [name] QUEtiapine (SEROQUEL) 100 MG tablet Take 100 mg by mouth at bedtime.  . rifaximin (XIFAXAN) 550 MG TABS tablet Take 550 mg by mouth 2 (two) times daily.  [name] rOPINIRole (REQUIP) 4 MG tablet Take 4 mg by mouth at bedtime.  . Skin Protectants, Misc. (EUCERIN) cream Apply 1 application daily topically. Apply to nasal septum bilaterally to prevent epistaxis from oxygen-induced drying   . sucralfate (CARAFATE) 1 GM/10ML suspension Take 1 g by mouth every 6 (six) hours as needed.  . torsemide (DEMADEX) 20 MG tablet Take 40 mg by mouth daily.  . traMADol (ULTRAM) 50 MG tablet Take 1 tablet (50 mg total) by mouth 2 (two) times daily. Not to be given with Xanax.  . valproic acid (DEPAKENE) 250 MG capsule Take 250 mg by mouth 2 (two) times daily.   No facility-administered encounter medications on file as of 07/03/2017.      REVIEW OF SYSTEMS  : All other systems reviewed and negative except where noted in the History of Present Illness.   PHYSICAL EXAM: BP (!) 108/42   Pulse 76    General: Chronically ill-appearing white female in no acute distress; in wheelchair and on O2 Head: Normocephalic and atraumatic Eyes:  Sclerae anicteric, conjunctiva pink. Ears: Normal auditory acuity Lungs: Clear throughout to auscultation; no increased WOB. Heart: Regular rate and rhythm; no M/R/G. Abdomen: Soft, non-distended.  BS present.  Non-tender. Musculoskeletal: Symmetrical with no gross deformities  Skin: No lesions on visible extremities Extremities: No edema  Neurological: Alert oriented x 4, grossly non-focal Psychological:  Alert and cooperative. 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  She recently underwent RFA, which was extensive.  Even since that procedure she has required blood transfusion.  She has been transferred to a new nursing home facility.  Her recommendations for this condition from our standpoint are the following: We are going to set her up for some IV iron infusions as we think she will receive the greatest benefit from those.  We would like her hemoglobin/CBC rechecked again in 1 week.  Once that seems to be stabilized or improved then can recheck at longer intervals.  We are unsure how much erythropoietin stimulating agents will really help in her situation, but if Dr. Linna Darner believes that this will help then we would defer that medication and dosing to him. *NASH cirrhosis: History of hepatic encephalopathy.  She is on lactulose 10 g 4 times daily.  The family had questions regarding following her ammonia levels.  We do not recommend following these regularly as they can significantly vary.  It was discussed with the family that there could be a prn order for an extra dose of lactulose to be given for confusion/altered mental status to see if this would help in those situations.   CC:  Garwin Brothers, MD

## 2017-07-03 NOTE — Patient Instructions (Signed)
Follow instructions Jena Gauss RN gave you about the iron infusions.  Call us for any problems you have.

## 2017-07-06 LAB — URINE CULTURE

## 2017-07-07 ENCOUNTER — Telehealth: Payer: Self-pay | Admitting: Emergency Medicine

## 2017-07-07 NOTE — Telephone Encounter (Signed)
Post ED Visit - Positive Culture Follow-up: Successful Patient Follow-Up  Culture assessed and recommendations reviewed by: []  Elenor Quinones, Pharm.D. []  Heide Guile, Pharm.D., BCPS AQ-ID []  Parks Neptune, Pharm.D., BCPS [x]  Alycia Rossetti, Pharm.D., BCPS []  [place], [place].D., BCPS, AAHIVP []  Legrand Como, Pharm.D., BCPS, AAHIVP []  Salome Arnt, PharmD, BCPS []  Dimitri Ped, PharmD, BCPS []  Vincenza Hews, PharmD, BCPS  Positive urine culture  []  Patient discharged without antimicrobial prescription and treatment is now indicated []  Organism is resistant to prescribed ED discharge antimicrobial []  Patient with positive blood cultures  Changes discussed with ED provider: Margarita Mail PA New antibiotic prescription stop Cefpodoxime, asymptomatic  Urine culture with recommendations faxed to [phone] Attn Loretta @ [address] 07/07/2017, 11:41 AM

## 2017-07-09 ENCOUNTER — Encounter (HOSPITAL_COMMUNITY): Payer: Self-pay | Admitting: Emergency Medicine

## 2017-07-09 ENCOUNTER — Observation Stay (HOSPITAL_COMMUNITY)
Admission: EM | Admit: 2017-07-09 | Discharge: 2017-07-10 | Disposition: A | Discharge status: Home / Self Care | Payer: Medicare Other | Attending: Internal Medicine | Admitting: Internal Medicine

## 2017-07-09 DIAGNOSIS — K754 Autoimmune hepatitis: Secondary | ICD-10-CM | POA: Diagnosis present

## 2017-07-09 DIAGNOSIS — Z87891 Personal history of nicotine dependence: Secondary | ICD-10-CM | POA: Diagnosis not present

## 2017-07-09 DIAGNOSIS — D649 Anemia, unspecified: Secondary | ICD-10-CM | POA: Diagnosis not present

## 2017-07-09 DIAGNOSIS — N183 Chronic kidney disease, stage 3 unspecified: Secondary | ICD-10-CM | POA: Diagnosis present

## 2017-07-09 DIAGNOSIS — K31819 Angiodysplasia of stomach and duodenum without bleeding: Secondary | ICD-10-CM | POA: Diagnosis present

## 2017-07-09 DIAGNOSIS — Z79899 Other long term (current) drug therapy: Secondary | ICD-10-CM | POA: Insufficient documentation

## 2017-07-09 DIAGNOSIS — K7581 Nonalcoholic steatohepatitis (NASH): Secondary | ICD-10-CM | POA: Diagnosis present

## 2017-07-09 DIAGNOSIS — I130 Hypertensive heart and chronic kidney disease with heart failure and stage 1 through stage 4 chronic kidney disease, or unspecified chronic kidney disease: Secondary | ICD-10-CM | POA: Insufficient documentation

## 2017-07-09 DIAGNOSIS — I10 Essential (primary) hypertension: Secondary | ICD-10-CM | POA: Diagnosis present

## 2017-07-09 DIAGNOSIS — K922 Gastrointestinal hemorrhage, unspecified: Secondary | ICD-10-CM | POA: Diagnosis not present

## 2017-07-09 DIAGNOSIS — D62 Acute posthemorrhagic anemia: Secondary | ICD-10-CM | POA: Diagnosis not present

## 2017-07-09 DIAGNOSIS — D500 Iron deficiency anemia secondary to blood loss (chronic): Secondary | ICD-10-CM | POA: Diagnosis present

## 2017-07-09 DIAGNOSIS — Z794 Long term (current) use of insulin: Secondary | ICD-10-CM | POA: Insufficient documentation

## 2017-07-09 DIAGNOSIS — D696 Thrombocytopenia, unspecified: Secondary | ICD-10-CM | POA: Diagnosis present

## 2017-07-09 DIAGNOSIS — R799 Abnormal finding of blood chemistry, unspecified: Secondary | ICD-10-CM | POA: Diagnosis present

## 2017-07-09 DIAGNOSIS — I110 Hypertensive heart disease with heart failure: Secondary | ICD-10-CM | POA: Insufficient documentation

## 2017-07-09 DIAGNOSIS — I2510 Atherosclerotic heart disease of native coronary artery without angina pectoris: Secondary | ICD-10-CM | POA: Insufficient documentation

## 2017-07-09 DIAGNOSIS — J449 Chronic obstructive pulmonary disease, unspecified: Secondary | ICD-10-CM | POA: Insufficient documentation

## 2017-07-09 DIAGNOSIS — I5032 Chronic diastolic (congestive) heart failure: Secondary | ICD-10-CM | POA: Diagnosis not present

## 2017-07-09 DIAGNOSIS — E1122 Type 2 diabetes mellitus with diabetic chronic kidney disease: Secondary | ICD-10-CM | POA: Diagnosis present

## 2017-07-09 DIAGNOSIS — K7460 Unspecified cirrhosis of liver: Secondary | ICD-10-CM | POA: Diagnosis present

## 2017-07-09 DIAGNOSIS — Z66 Do not resuscitate: Secondary | ICD-10-CM | POA: Insufficient documentation

## 2017-07-09 DIAGNOSIS — J4489 Other specified chronic obstructive pulmonary disease: Secondary | ICD-10-CM | POA: Diagnosis present

## 2017-07-09 LAB — PROTIME-INR
INR: 1.11
Prothrombin Time: 14.2 seconds (ref 11.4–15.2)

## 2017-07-09 LAB — CBC WITH DIFFERENTIAL/PLATELET
BASOS PCT: 1 %
Basophils Absolute: 0 10*3/uL (ref 0.0–0.1)
EOS PCT: 6 %
Eosinophils Absolute: 0.2 10*3/uL (ref 0.0–0.7)
HEMATOCRIT: 21.3 % — AB (ref 36.0–46.0)
HEMOGLOBIN: 6.9 g/dL — AB (ref 12.0–15.0)
LYMPHS PCT: 12 %
Lymphs Abs: 0.4 10*3/uL — ABNORMAL LOW (ref 0.7–4.0)
MCH: 31.8 pg (ref 26.0–34.0)
MCHC: 32.4 g/dL (ref 30.0–36.0)
MCV: 98.2 fL (ref 78.0–100.0)
MONOS PCT: 8 %
Monocytes Absolute: 0.2 10*3/uL (ref 0.1–1.0)
NEUTROS ABS: 2.3 10*3/uL (ref 1.7–7.7)
Neutrophils Relative %: 73 %
Platelets: 73 10*3/uL — ABNORMAL LOW (ref 150–400)
RBC: 2.17 MIL/uL — ABNORMAL LOW (ref 3.87–5.11)
RDW: 18.2 % — ABNORMAL HIGH (ref 11.5–15.5)
WBC: 3.1 10*3/uL — ABNORMAL LOW (ref 4.0–10.5)

## 2017-07-09 LAB — COMPREHENSIVE METABOLIC PANEL
ALK PHOS: 115 U/L (ref 38–126)
ALT: 22 U/L (ref 14–54)
ANION GAP: 8 (ref 5–15)
AST: 29 U/L (ref 15–41)
Albumin: 2.4 g/dL — ABNORMAL LOW (ref 3.5–5.0)
BILIRUBIN TOTAL: 1 mg/dL (ref 0.3–1.2)
BUN: 22 mg/dL — ABNORMAL HIGH (ref 6–20)
CALCIUM: 8.4 mg/dL — AB (ref 8.9–10.3)
CO2: 25 mmol/L (ref 22–32)
CREATININE: 1.25 mg/dL — AB (ref 0.44–1.00)
Chloride: 102 mmol/L (ref 101–111)
GFR calc non Af Amer: 42 mL/min — ABNORMAL LOW (ref >60)
GFR, EST AFRICAN AMERICAN: 48 mL/min — AB (ref >60)
Glucose, Bld: 288 mg/dL — ABNORMAL HIGH (ref 65–99)
Potassium: 3.5 mmol/L (ref 3.5–5.1)
Sodium: 135 mmol/L (ref 135–145)
TOTAL PROTEIN: 4.6 g/dL — AB (ref 6.5–8.1)

## 2017-07-09 LAB — GLUCOSE, CAPILLARY: GLUCOSE-CAPILLARY: 300 mg/dL — AB (ref 65–99)

## 2017-07-09 LAB — CBG MONITORING, ED: Glucose-Capillary: 256 mg/dL — ABNORMAL HIGH (ref 65–99)

## 2017-07-09 LAB — PREPARE RBC (CROSSMATCH)

## 2017-07-09 MED ORDER — POTASSIUM CHLORIDE CRYS ER 20 MEQ PO TBCR
20.0000 meq | EXTENDED_RELEASE_TABLET | Freq: Every day | ORAL | Status: DC
  Administered 2017-07-10: 20 meq via ORAL
  Filled 2017-07-09: qty 1

## 2017-07-09 MED ORDER — ONDANSETRON HCL 4 MG/2ML IJ SOLN: 4.0000 mg | Freq: Four times a day (QID) | INTRAMUSCULAR | Status: DC | PRN

## 2017-07-09 MED ORDER — ADULT MULTIVITAMIN W/MINERALS CH
1.0000 | ORAL_TABLET | Freq: Every day | ORAL | Status: DC
Start: 2017-07-10 — End: 2017-07-10
  Administered 2017-07-10: 1 via ORAL
  Filled 2017-07-09: qty 1

## 2017-07-09 MED ORDER — ATORVASTATIN CALCIUM 20 MG PO TABS: 20.0000 mg | ORAL_TABLET | Freq: Every day | ORAL | Status: DC

## 2017-07-09 MED ORDER — TRAMADOL HCL 50 MG PO TABS
50.0000 mg | ORAL_TABLET | Freq: Two times a day (BID) | ORAL | Status: DC
  Administered 2017-07-09 – 2017-07-10 (×2): 50 mg via ORAL
  Filled 2017-07-09 (×2): qty 1

## 2017-07-09 MED ORDER — ALPRAZOLAM 0.5 MG PO TABS
0.5000 mg | ORAL_TABLET | Freq: Two times a day (BID) | ORAL | Status: DC | PRN
  Administered 2017-07-09 – 2017-07-10 (×2): 0.5 mg via ORAL
  Filled 2017-07-09 (×3): qty 1

## 2017-07-09 MED ORDER — PROMETHAZINE HCL 25 MG PO TABS
12.5000 mg | ORAL_TABLET | Freq: Two times a day (BID) | ORAL | Status: DC
  Administered 2017-07-09 – 2017-07-10 (×2): 12.5 mg via ORAL
  Filled 2017-07-09 (×2): qty 1

## 2017-07-09 MED ORDER — INSULIN ASPART 100 UNIT/ML ~~LOC~~ SOLN
0.0000 [IU] | Freq: Three times a day (TID) | SUBCUTANEOUS | Status: DC
  Administered 2017-07-10 (×2): 5 [IU] via SUBCUTANEOUS

## 2017-07-09 MED ORDER — QUETIAPINE FUMARATE 100 MG PO TABS
100.0000 mg | ORAL_TABLET | Freq: Every day | ORAL | Status: DC
  Administered 2017-07-09: 100 mg via ORAL
  Filled 2017-07-09: qty 1

## 2017-07-09 MED ORDER — LACTULOSE 10 GM/15ML PO SOLN
10.0000 g | Freq: Four times a day (QID) | ORAL | Status: DC
  Administered 2017-07-09 – 2017-07-10 (×2): 10 g via ORAL
  Filled 2017-07-09 (×2): qty 15

## 2017-07-09 MED ORDER — FERROUS GLUCONATE 324 (38 FE) MG PO TABS
324.0000 mg | ORAL_TABLET | Freq: Every day | ORAL | Status: DC
  Filled 2017-07-09: qty 1

## 2017-07-09 MED ORDER — SODIUM CHLORIDE 0.9 % IV SOLN
10.0000 mL/h | Freq: Once | INTRAVENOUS | Status: AC
  Administered 2017-07-09: 10 mL/h via INTRAVENOUS

## 2017-07-09 MED ORDER — ISOSORBIDE MONONITRATE ER 30 MG PO TB24
30.0000 mg | ORAL_TABLET | Freq: Every day | ORAL | Status: DC
  Administered 2017-07-10: 30 mg via ORAL
  Filled 2017-07-09: qty 1

## 2017-07-09 MED ORDER — TORSEMIDE 20 MG PO TABS
40.0000 mg | ORAL_TABLET | Freq: Every day | ORAL | Status: DC
  Administered 2017-07-10: 40 mg via ORAL
  Filled 2017-07-09: qty 2

## 2017-07-09 MED ORDER — INSULIN GLARGINE 100 UNIT/ML ~~LOC~~ SOLN
60.0000 [IU] | Freq: Every day | SUBCUTANEOUS | Status: DC
  Filled 2017-07-09: qty 0.6

## 2017-07-09 MED ORDER — MOMETASONE FURO-FORMOTEROL FUM 200-5 MCG/ACT IN AERO
2.0000 | INHALATION_SPRAY | Freq: Two times a day (BID) | RESPIRATORY_TRACT | Status: DC
  Administered 2017-07-09 – 2017-07-10 (×2): 2 via RESPIRATORY_TRACT
  Filled 2017-07-09: qty 8.8

## 2017-07-09 MED ORDER — POLYVINYL ALCOHOL 1.4 % OP SOLN
1.0000 [drp] | Freq: Every morning | OPHTHALMIC | Status: DC
  Administered 2017-07-10: 1 [drp] via OPHTHALMIC
  Filled 2017-07-09: qty 15

## 2017-07-09 MED ORDER — NITROGLYCERIN 0.4 MG SL SUBL: 0.4000 mg | SUBLINGUAL_TABLET | SUBLINGUAL | Status: DC | PRN

## 2017-07-09 MED ORDER — DICYCLOMINE HCL 20 MG PO TABS
20.0000 mg | ORAL_TABLET | Freq: Two times a day (BID) | ORAL | Status: DC
  Administered 2017-07-09 – 2017-07-10 (×2): 20 mg via ORAL
  Filled 2017-07-09 (×2): qty 1

## 2017-07-09 MED ORDER — VALPROIC ACID 250 MG PO CAPS
250.0000 mg | ORAL_CAPSULE | Freq: Two times a day (BID) | ORAL | Status: DC
  Administered 2017-07-09 – 2017-07-10 (×2): 250 mg via ORAL
  Filled 2017-07-09 (×2): qty 1

## 2017-07-09 MED ORDER — BUPROPION HCL ER (XL) 150 MG PO TB24
150.0000 mg | ORAL_TABLET | Freq: Every day | ORAL | Status: DC
  Administered 2017-07-10: 150 mg via ORAL
  Filled 2017-07-09: qty 1

## 2017-07-09 MED ORDER — ROPINIROLE HCL 1 MG PO TABS
4.0000 mg | ORAL_TABLET | Freq: Every day | ORAL | Status: DC
  Administered 2017-07-09: 4 mg via ORAL
  Filled 2017-07-09: qty 4

## 2017-07-09 MED ORDER — OXYCODONE HCL 5 MG PO TABS
5.0000 mg | ORAL_TABLET | Freq: Four times a day (QID) | ORAL | Status: DC | PRN
  Administered 2017-07-10: 5 mg via ORAL
  Filled 2017-07-09: qty 1

## 2017-07-09 MED ORDER — LEVALBUTEROL HCL 0.63 MG/3ML IN NEBU: 0.6300 mg | INHALATION_SOLUTION | Freq: Two times a day (BID) | RESPIRATORY_TRACT | Status: DC

## 2017-07-09 MED ORDER — ALBUTEROL SULFATE (2.5 MG/3ML) 0.083% IN NEBU: 2.5000 mg | INHALATION_SOLUTION | Freq: Four times a day (QID) | RESPIRATORY_TRACT | Status: DC | PRN

## 2017-07-09 MED ORDER — RIFAXIMIN 550 MG PO TABS
550.0000 mg | ORAL_TABLET | Freq: Two times a day (BID) | ORAL | Status: DC
  Administered 2017-07-09 – 2017-07-10 (×2): 550 mg via ORAL
  Filled 2017-07-09 (×2): qty 1

## 2017-07-09 MED ORDER — ONDANSETRON HCL 4 MG PO TABS: 4.0000 mg | ORAL_TABLET | Freq: Four times a day (QID) | ORAL | Status: DC | PRN

## 2017-07-09 NOTE — ED Notes (Signed)
Per patient request, attempted to call [name] to retrieve family phone numbers with no answer.

## 2017-07-09 NOTE — ED Notes (Signed)
Per patient request, spoke with patient's sister, Karrie Meres, [phone]. Patient's family made aware patient is in the ED.

## 2017-07-09 NOTE — ED Notes (Signed)
Per patient request, attempted to call patient's daughter with no answer.

## 2017-07-09 NOTE — ED Triage Notes (Signed)
Per EMS, patient from [place], sent for hemoglobin of 6.9. Patient has hx gastric ulcer and esophageal varices. Patient has been receiving transfusions every 3-4 days. PA at [facility] is requesting palliative care consult. Patient has no complaints.   BP 142/50 HR 76 RR 20 100% 2L [place] CBG 363

## 2017-07-09 NOTE — ED Triage Notes (Signed)
A nurse practitioner phones at this time to tell us they are about to send this pt. Here via EMS. She has a known chronically leaking gastric lesion and currently has a hgb of 6. They request that the hospitalist phone Dr. Linna Darner at [phone] if possible. They also request palliative care to speak with pt's. Family regarding the gravity of pt's. Condition.
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
## 2017-07-09 NOTE — ED Notes (Signed)
Patient given soup and a [place] sandwich.

## 2017-07-09 NOTE — H&P (Signed)
History and Physical    [name] IRW:[id number] DOB: 1941/03/01 DOA: 07/09/2017  PCP: Garwin Brothers, MD  Patient coming from: [place].  Chief Complaint: Low hemoglobin.  HPI: [name] is a 76 y.o. female with history of cirrhosis of the liver secondary to [place], history of GA VE status post radiofrequency ablation by Dr. Havery Moros gastroenterologist and as per the patient was done just recently 4 days ago had routine blood work done at the nursing home and was found to have a low hemoglobin on referred to ER.  Patient has not had any nausea vomiting or diarrhea.  Patient has poor vision and has not noticed any obvious bleeding.  Denies any chest pain or shortness of breath.  ED Course: In the ER hemoglobin is found to be around 6.9 a drop of normal is 1 g from last week of 7.8.  Patient admitted for transfusion.  Review of Systems: As per HPI, rest all negative.   Past Medical History:  Diagnosis Date  . Adenomatous colon polyp   . Anxiety   . Asthma   . Back pain   . Benign neoplasm of colon   . CAP (community acquired pneumonia) 2015  . Carpal tunnel syndrome on both sides   . Chronic airway obstruction, not elsewhere classified   . Chronic diastolic CHF (congestive heart failure) ([place])    a. 03/26/2014 Echo: EF 55-60%, no rwma, mild AS/AI, mod-sev Ca2+ MV annulus, mildly to mod dil LA.  [name] Coarse tremors    a. arms.  . Diverticulosis of colon (without mention of hemorrhage)   . Esophageal reflux   . Falls frequently    completed HHPT/OT 06/2014, PT unmet goals  . GAVE (gastric antral vascular ectasia)    angiodysplasia; s/p multiple APCs, discussing RFA with [facility] GI Dr Newman Pies (06/2015)  . H/O hiatal hernia   . Hepatitis   . Hiatal hernia   . Hyperlipidemia   . Iron deficiency anemia secondary to blood loss (chronic)    a. frequent PRBC transfusions.  . Liver cirrhosis secondary to nonalcoholic steatohepatitis (NASH) ([place])    a. dx'd  1990's  . Major depressive disorder, recurrent episode, severe, specified as with psychotic behavior   . Memory loss   . Midsternal chest pain    a. conservatively managed ->poor candidate for cath/anticoagulation.  . Mild aortic stenosis    a. 03/2014 Valve area (VTI): 2.89 cm^2, Valve area (Vmax): 2.7 cm^2.  . Morbid obesity ([place])   . Neck pain   . Non-obstructive CAD   . On home oxygen therapy    "3L; 24/7" (11/29/2015)  . OSA (obstructive sleep apnea)    a. does not use CPAP. (01/25/2015)  . Osteoarthrosis, unspecified whether generalized or localized, unspecified site   . Osteoporosis, unspecified   . Palpitations   . Portal hypertensive gastropathy ([place])   . Protein calorie malnutrition ([place])   . Recurrent UTI (urinary tract infection)    h/o hospitalization with urosepsis 2015, but thought large component colonization/bacteriuria, only treat if symptomatic ([place])  . Restless legs syndrome (RLS)   . Schizophrenia ([place])   . Thrombocytopenia ([place])   . Type II diabetes mellitus ([place])   . Unspecified chronic bronchitis ([place])   . Unspecified essential hypertension     Past Surgical History:  Procedure Laterality Date  . APPENDECTOMY    . BREAST BIOPSY     bilateral  . CESAREAN SECTION     x 3  .  EUS  09/2015   antral CAVL, ablated; nodular erosive gastritis; mult pancreatic cysts rec rpt CT pancreas protocol or MRI to survey pancreas cysts 1-2 yrs (Dr Amie Critchley)  . HEMORROIDECTOMY    . US ECHOCARDIOGRAPHY  07/2014   mild LVH, EF 60-65%, normal wall motion, mild AR, mod dilated LA, mildly dilated RA, peak PA pressure 58mmHg  . VAGINAL HYSTERECTOMY       reports that she quit smoking about 24 years ago. Her smoking use included cigarettes. She has a 37.50 pack-year smoking history. she has never used smokeless tobacco. She reports that she does not drink alcohol or use drugs.  Allergies  Allergen Reactions  . Acetaminophen Other (See Comments)    Liver dysfunction  .  Nsaids Other (See Comments)    Liver dysfunction   . Aspirin Other (See Comments)    Causes nosebleeds  . Theophylline Nausea And Vomiting  . Penicillin G Itching  . Penicillins Itching and Other (See Comments)    Has patient had a PCN reaction causing immediate rash, facial/tongue/throat swelling, SOB or lightheadedness with hypotension: unknown Has patient had a PCN reaction causing severe rash involving mucus membranes or skin necrosis: unknown Has patient had a PCN reaction that required hospitalization unknown Has patient had a PCN reaction occurring within the last 10 years: yes If all of the above answers are "NO", then may proceed with Cephalosporin use.     Family History  Problem Relation Age of Onset  . Heart disease Mother   . Cervical cancer Mother   . Kidney disease Mother   . Diabetes Mother   . Heart attack Mother   . Diabetes Father   . Heart attack Father   . Multiple sclerosis Other   . Breast cancer Sister   . Multiple sclerosis Sister   . Breast cancer Maternal Aunt        x 2  . Diabetes Sister   . Stroke Daughter   . Stroke Daughter   . Lupus Daughter   . Colon cancer Neg Hx     Prior to Admission medications   Medication Sig Start Date End Date Taking? Authorizing Provider  ADVAIR DISKUS 250-50 MCG/DOSE AEPB INHALE 1 PUFF INTO LUNGS TWICE A DAY 05/29/15  Yes Ria Bush, MD  albuterol (PROVENTIL) (2.5 MG/3ML) 0.083% nebulizer solution Take 2.5 mg by nebulization every 6 (six) hours as needed for wheezing or shortness of breath.   Yes [provider]  ALPRAZolam Duanne Moron) 0.5 MG tablet Take 0.5 mg 2 (two) times daily as needed by mouth for anxiety.   Yes [provider]  atorvastatin (LIPITOR) 20 MG tablet Take 20 mg by mouth daily.    Yes [provider]  buPROPion (WELLBUTRIN XL) 150 MG 24 hr tablet Take 150 mg by mouth daily.   Yes [provider]  cefpodoxime (VANTIN) 100 MG tablet Take 1 tablet (100 mg  total) 2 (two) times daily for 7 days by mouth. 07/02/17 07/09/17 Yes Tegeler, Gwenyth Allegra, MD  Darbepoetin Alfa (ARANESP) 100 MCG/0.5ML SOSY injection Inject 100 mcg every 14 (fourteen) days into the skin.   Yes [provider]  dicyclomine (BENTYL) 20 MG tablet Take 1 tablet (20 mg total) by mouth 2 (two) times daily. 04/07/16  Yes Doristine Devoid, PA-C  ferrous gluconate (FERGON) 324 MG tablet Take 324 mg by mouth daily with breakfast.    Yes [provider]  Insulin Glargine (LANTUS SOLOSTAR) 100 UNIT/ML Solostar Pen Inject 60 Units  into the skin daily at 10 pm.    Yes [provider]  isosorbide mononitrate (IMDUR) 30 MG 24 hr tablet Take 1 tablet (30 mg total) by mouth daily. 04/01/16  Yes Weaver, Scott T, PA-C  lactulose (CHRONULAC) 10 GM/15ML solution Take 10 g by mouth 4 (four) times daily.   Yes [provider]  levalbuterol (XOPENEX) 0.63 MG/3ML nebulizer solution Take 0.63 mg by nebulization 2 (two) times daily.  06/05/14  Yes [provider]  Multiple Vitamin (MULTIVITAMIN WITH MINERALS) TABS tablet Take 1 tablet by mouth daily.    Yes [provider]  nitroGLYCERIN (NITROSTAT) 0.4 MG SL tablet Place 1 tablet (0.4 mg total) under the tongue every 5 (five) minutes as needed for chest pain. No more than 3 in 15 minutes 02/16/16  Yes Sherren Mocha, MD  oxyCODONE (OXY IR/ROXICODONE) 5 MG immediate release tablet Take 1 tablet (5 mg total) by mouth every 6 (six) hours as needed for moderate pain or severe pain. 06/15/17  Yes Patrecia Pour, MD  OXYGEN Inhale 2 L into the lungs continuous.    Yes [provider]  pantoprazole (PROTONIX) 40 MG tablet TAKE 1 TABLET BY MOUTH TWICE A DAY Patient taking differently: TAKE 20MG  BY MOUTH TWICE A DAY 10/12/15  Yes Ria Bush, MD  potassium chloride SA (K-DUR,KLOR-CON) 20 MEQ tablet Take 20 mEq by mouth daily.   Yes [provider]  promethazine (PHENERGAN) 12.5 MG tablet  Take 12.5 mg by mouth every 12 (twelve) hours.   Yes [provider]  Propylene Glycol (SYSTANE BALANCE) 0.6 % SOLN Place 1 drop into both eyes every morning.   Yes [provider]  QUEtiapine (SEROQUEL) 100 MG tablet Take 100 mg by mouth at bedtime.   Yes [provider]  rifaximin (XIFAXAN) 550 MG TABS tablet Take 550 mg by mouth 2 (two) times daily.   Yes [provider]  rOPINIRole (REQUIP) 4 MG tablet Take 4 mg by mouth at bedtime.   Yes [provider]  Skin Protectants, Misc. (EUCERIN) cream Apply 1 application daily topically. Apply to nasal septum bilaterally to prevent epistaxis from oxygen-induced drying    Yes [provider]  sucralfate (CARAFATE) 1 GM/10ML suspension Take 1 g by mouth every 6 (six) hours as needed.   Yes [provider]  torsemide (DEMADEX) 20 MG tablet Take 40 mg by mouth daily.   Yes [provider]  traMADol (ULTRAM) 50 MG tablet Take 1 tablet (50 mg total) by mouth 2 (two) times daily. Not to be given with Xanax. 06/15/17  Yes Patrecia Pour, MD  valproic acid (DEPAKENE) 250 MG capsule Take 250 mg by mouth 2 (two) times daily.   Yes [provider]    Physical Exam: Vitals:   07/09/17 2000 07/09/17 2030 07/09/17 2130 07/09/17 2200  BP: (!) 129/50 (!) 144/53 118/64 103/69  Pulse: 64 65 67 65  Resp: (!) 22 19 16 18   Temp:      TempSrc:      SpO2: 100% 100% 100% 98%  Weight:      Height:          Constitutional: Moderately built and nourished. Vitals:   07/09/17 2000 07/09/17 2030 07/09/17 2130 07/09/17 2200  BP: (!) 129/50 (!) 144/53 118/64 103/69  Pulse: 64 65 67 65  Resp: (!) 22 19 16 18   Temp:      TempSrc:      SpO2: 100% 100% 100% 98%  Weight:  Height:       Eyes: Anicteric mild pallor. ENMT: No discharge from the ears eyes nose or mouth. Neck: No mass felt.  No neck rigidity. Respiratory: No rhonchi or crepitations. Cardiovascular: S1-S2  heard. Abdomen: Soft nontender bowel sounds present. Musculoskeletal: No edema.  No joint effusion. Skin: No rash.  Skin appears warm. Neurologic: Alert awake oriented to time place and person.  Moves all extremities. Psychiatric: Appears normal.  Normal affect.   Labs on Admission: I have personally reviewed following labs and imaging studies  CBC: Recent Labs  Lab 07/09/17 1849  WBC 3.1*  NEUTROABS 2.3  HGB 6.9*  HCT 21.3*  MCV 98.2  PLT 73*   Basic Metabolic Panel: Recent Labs  Lab 07/09/17 1849  NA 135  K 3.5  CL 102  CO2 25  GLUCOSE 288*  BUN 22*  CREATININE 1.25*  CALCIUM 8.4*   GFR: Estimated Creatinine Clearance: 39.4 mL/min (A) (by C-G formula based on SCr of 1.25 mg/dL (H)). Liver Function Tests: Recent Labs  Lab 07/09/17 1849  AST 29  ALT 22  ALKPHOS 115  BILITOT 1.0  PROT 4.6*  ALBUMIN 2.4*   No results for input(s): LIPASE, AMYLASE in the last 168 hours. No results for input(s): AMMONIA in the last 168 hours. Coagulation Profile: Recent Labs  Lab 07/09/17 1849  INR 1.11   Cardiac Enzymes: No results for input(s): CKTOTAL, CKMB, CKMBINDEX, TROPONINI in the last 168 hours. BNP (last 3 results) No results for input(s): PROBNP in the last 8760 hours. HbA1C: No results for input(s): HGBA1C in the last 72 hours. CBG: Recent Labs  Lab 07/09/17 1839  GLUCAP 256*   Lipid Profile: No results for input(s): CHOL, HDL, LDLCALC, TRIG, CHOLHDL, LDLDIRECT in the last 72 hours. Thyroid Function Tests: No results for input(s): TSH, T4TOTAL, FREET4, T3FREE, THYROIDAB in the last 72 hours. Anemia Panel: No results for input(s): VITAMINB12, FOLATE, FERRITIN, TIBC, IRON, RETICCTPCT in the last 72 hours. Urine analysis:    Component Value Date/Time   COLORURINE YELLOW 07/02/2017 2140   APPEARANCEUR CLEAR 07/02/2017 2140   LABSPEC 1.010 07/02/2017 2140   PHURINE 6.0 07/02/2017 2140   GLUCOSEU NEGATIVE 07/02/2017 2140   GLUCOSEU NEGATIVE 07/17/2015  0907   HGBUR NEGATIVE 07/02/2017 2140   BILIRUBINUR NEGATIVE 07/02/2017 2140   BILIRUBINUR negative 02/26/2016 1206   KETONESUR NEGATIVE 07/02/2017 2140   PROTEINUR NEGATIVE 07/02/2017 2140   UROBILINOGEN 4.0 02/26/2016 1206   UROBILINOGEN 0.2 07/17/2015 0907   NITRITE NEGATIVE 07/02/2017 2140   LEUKOCYTESUR SMALL (A) 07/02/2017 2140   Sepsis Labs: @LABRCNTIP (procalcitonin:4,lacticidven:4) ) Recent Results (from the past 240 hour(s))  Urine culture     Status: Abnormal   Collection Time: 07/02/17  9:46 PM  Result Value Ref Range Status   Specimen Description URINE, CLEAN CATCH  Final   Special Requests NONE  Final   Culture >=100,000 COLONIES/mL ENTEROBACTER CLOACAE (A)  Final   Report Status 07/06/2017 FINAL  Final   Organism ID, Bacteria ENTEROBACTER CLOACAE (A)  Final      Susceptibility   Enterobacter cloacae - MIC*    CEFAZOLIN >=64 RESISTANT Resistant     CEFTRIAXONE 8 SENSITIVE Sensitive     CIPROFLOXACIN 2 INTERMEDIATE Intermediate     GENTAMICIN >=16 RESISTANT Resistant     IMIPENEM <=0.25 SENSITIVE Sensitive     NITROFURANTOIN <=16 SENSITIVE Sensitive     TRIMETH/SULFA >=320 RESISTANT Resistant     PIP/TAZO 32 INTERMEDIATE Intermediate     * >=100,000 COLONIES/mL ENTEROBACTER  CLOACAE     Radiological Exams on Admission: No results found.   Assessment/Plan Principal Problem:   Acute blood loss anemia Active Problems:   Iron deficiency anemia due to chronic blood loss   Essential hypertension   Autoimmune hepatitis (HCC)   GAVE (gastric antral vascular ectasia)   CKD stage 3 secondary to diabetes (HCC)   Thrombocytopenia (HCC)   Chronic diastolic heart failure (HCC)   Liver cirrhosis secondary to NASH (HCC)   COPD with chronic bronchitis ([place])    1. Acute blood loss anemia with history of chronic GI loss secondary to [address] has had recent radiofrequency ablation done by Dr. Havery Moros -we will transfuse 2 units of PRBC and recheck hemoglobin in the  morning.  Continue Protonix IV.  Please discuss with Dr. Havery Moros in a.m. if patient needs another procedure as per the patient patient had a procedure 4 days ago.  In the chart I can see each procedure done on 10/24 by Dr. Havery Moros. 2. Diabetes mellitus type 2 on Lantus insulin 60 units in the morning.  I have also place patient on sliding scale coverage. 3. Chronic diastolic CHF last EF measured in March 2017 -showed grade 2 diastolic dysfunction on torsemide. 4. COPD -not actively wheezing. 5. Cirrhosis of the liver -continue Xifaxan and lactulose.  Patient is also on torsemide. 6. Hyperlipidemia on statins. 7. Chronic kidney disease stage III -creatinine appears to be at baseline. 8. Chronic thrombocytopenia -follow CBC.   DVT prophylaxis: SCDs. Code Status: DNR. Family Communication: Discussed with patient. Disposition Plan: Back to skilled nursing facility. Consults called: None. Admission status: Observation.   Rise Patience MD [facility] Pager [phone].  If 7PM-7AM, please contact night-coverage [email or web address] Password [facility]  07/09/2017, 10:21 PM

## 2017-07-09 NOTE — ED Notes (Signed)
Patient given ginger ale. 

## 2017-07-09 NOTE — ED Notes (Signed)
Bed: WA06 Expected date:  Expected time:  Means of arrival:  Comments: Ems/anemic

## 2017-07-10 ENCOUNTER — Other Ambulatory Visit: Payer: Self-pay

## 2017-07-10 DIAGNOSIS — I5032 Chronic diastolic (congestive) heart failure: Secondary | ICD-10-CM | POA: Diagnosis not present

## 2017-07-10 DIAGNOSIS — K31819 Angiodysplasia of stomach and duodenum without bleeding: Secondary | ICD-10-CM | POA: Diagnosis not present

## 2017-07-10 DIAGNOSIS — D500 Iron deficiency anemia secondary to blood loss (chronic): Secondary | ICD-10-CM

## 2017-07-10 DIAGNOSIS — D62 Acute posthemorrhagic anemia: Secondary | ICD-10-CM | POA: Diagnosis not present

## 2017-07-10 DIAGNOSIS — D696 Thrombocytopenia, unspecified: Secondary | ICD-10-CM

## 2017-07-10 DIAGNOSIS — K7581 Nonalcoholic steatohepatitis (NASH): Secondary | ICD-10-CM

## 2017-07-10 DIAGNOSIS — K754 Autoimmune hepatitis: Secondary | ICD-10-CM

## 2017-07-10 DIAGNOSIS — K922 Gastrointestinal hemorrhage, unspecified: Secondary | ICD-10-CM

## 2017-07-10 DIAGNOSIS — E1122 Type 2 diabetes mellitus with diabetic chronic kidney disease: Secondary | ICD-10-CM | POA: Diagnosis not present

## 2017-07-10 DIAGNOSIS — D649 Anemia, unspecified: Secondary | ICD-10-CM | POA: Diagnosis not present

## 2017-07-10 DIAGNOSIS — N183 Chronic kidney disease, stage 3 (moderate): Secondary | ICD-10-CM

## 2017-07-10 DIAGNOSIS — K7460 Unspecified cirrhosis of liver: Secondary | ICD-10-CM | POA: Diagnosis not present

## 2017-07-10 DIAGNOSIS — I10 Essential (primary) hypertension: Secondary | ICD-10-CM | POA: Diagnosis not present

## 2017-07-10 LAB — GLUCOSE, CAPILLARY
GLUCOSE-CAPILLARY: 270 mg/dL — AB (ref 65–99)
Glucose-Capillary: 281 mg/dL — ABNORMAL HIGH (ref 65–99)

## 2017-07-10 LAB — BASIC METABOLIC PANEL
ANION GAP: 4 — AB (ref 5–15)
BUN: 25 mg/dL — ABNORMAL HIGH (ref 6–20)
CHLORIDE: 107 mmol/L (ref 101–111)
CO2: 28 mmol/L (ref 22–32)
Calcium: 8.6 mg/dL — ABNORMAL LOW (ref 8.9–10.3)
Creatinine, Ser: 1.13 mg/dL — ABNORMAL HIGH (ref 0.44–1.00)
GFR calc Af Amer: 54 mL/min — ABNORMAL LOW (ref >60)
GFR calc non Af Amer: 47 mL/min — ABNORMAL LOW (ref >60)
Glucose, Bld: 290 mg/dL — ABNORMAL HIGH (ref 65–99)
POTASSIUM: 3.9 mmol/L (ref 3.5–5.1)
SODIUM: 139 mmol/L (ref 135–145)

## 2017-07-10 LAB — CBC
HEMATOCRIT: 27.1 % — AB (ref 36.0–46.0)
Hemoglobin: 8.8 g/dL — ABNORMAL LOW (ref 12.0–15.0)
MCH: 31.2 pg (ref 26.0–34.0)
MCHC: 32.5 g/dL (ref 30.0–36.0)
MCV: 96.1 fL (ref 78.0–100.0)
Platelets: 72 10*3/uL — ABNORMAL LOW (ref 150–400)
RBC: 2.82 MIL/uL — AB (ref 3.87–5.11)
RDW: 18 % — ABNORMAL HIGH (ref 11.5–15.5)
WBC: 3 10*3/uL — AB (ref 4.0–10.5)

## 2017-07-10 MED ORDER — TRAMADOL HCL 50 MG PO TABS: 50.0000 mg | ORAL_TABLET | Freq: Two times a day (BID) | ORAL | 0 refills | Status: DC | PRN

## 2017-07-10 MED ORDER — PANTOPRAZOLE SODIUM 40 MG IV SOLR
40.0000 mg | Freq: Two times a day (BID) | INTRAVENOUS | Status: DC
  Administered 2017-07-10: 40 mg via INTRAVENOUS
  Filled 2017-07-10 (×2): qty 40

## 2017-07-10 MED ORDER — OXYCODONE HCL 5 MG PO TABS: 5.0000 mg | ORAL_TABLET | Freq: Four times a day (QID) | ORAL | 0 refills | Status: DC | PRN

## 2017-07-10 MED ORDER — ALPRAZOLAM 0.5 MG PO TABS: 0.5000 mg | ORAL_TABLET | Freq: Two times a day (BID) | ORAL | 0 refills | Status: DC | PRN

## 2017-07-10 MED ORDER — LEVALBUTEROL HCL 0.63 MG/3ML IN NEBU
0.6300 mg | INHALATION_SOLUTION | Freq: Three times a day (TID) | RESPIRATORY_TRACT | Status: DC
  Administered 2017-07-10: 0.63 mg via RESPIRATORY_TRACT
  Filled 2017-07-10: qty 3

## 2017-07-10 MED ORDER — POLYSACCHARIDE IRON COMPLEX 150 MG PO CAPS
150.0000 mg | ORAL_CAPSULE | Freq: Two times a day (BID) | ORAL | Status: DC
  Administered 2017-07-10: 150 mg via ORAL
  Filled 2017-07-10: qty 1

## 2017-07-10 MED ORDER — INSULIN GLARGINE 100 UNIT/ML ~~LOC~~ SOLN
60.0000 [IU] | Freq: Every day | SUBCUTANEOUS | Status: DC
  Administered 2017-07-10: 60 [IU] via SUBCUTANEOUS
  Filled 2017-07-10: qty 0.6

## 2017-07-10 MED ORDER — SODIUM CHLORIDE 0.9 % IV SOLN
510.0000 mg | Freq: Once | INTRAVENOUS | Status: AC
  Administered 2017-07-10: 510 mg via INTRAVENOUS
  Filled 2017-07-10: qty 17

## 2017-07-10 MED ORDER — POLYSACCHARIDE IRON COMPLEX 150 MG PO CAPS: 150.0000 mg | ORAL_CAPSULE | Freq: Two times a day (BID) | ORAL | Status: DC

## 2017-07-10 MED ORDER — LEVALBUTEROL HCL 0.63 MG/3ML IN NEBU
0.6300 mg | INHALATION_SOLUTION | Freq: Two times a day (BID) | RESPIRATORY_TRACT | Status: DC
  Administered 2017-07-10: 0.63 mg via RESPIRATORY_TRACT
  Filled 2017-07-10: qty 3

## 2017-07-10 NOTE — Discharge Summary (Signed)
Physician Discharge Summary  [name]:[id number] DOB: June 05, 1941 DOA: 07/09/2017  PCP: Garwin Brothers, MD  Admit date: 07/09/2017 Discharge date: 07/10/2017  Time spent: 35 minutes  Recommendations for Outpatient Follow-up:  1. Repeat CBC to follow Hgb and platelets trend  2. Repeat BMET to follow electrolytes and renal function   Discharge Diagnoses:  Principal Problem:   Acute blood loss anemia Active Problems:   Iron deficiency anemia due to chronic blood loss   Essential hypertension   Autoimmune hepatitis (HCC)   GAVE (gastric antral vascular ectasia)   CKD stage 3 secondary to diabetes (HCC)   Chronic GI bleeding   Thrombocytopenia (HCC)   Chronic diastolic heart failure (HCC)   Liver cirrhosis secondary to NASH ([place])   COPD with chronic bronchitis ([place])   Discharge Condition: stable and improved. Discharge back to SNF for further care and rehabilitation. Outpatient follow up with PCP and with Dr. Havery Moros (GI service)  Diet recommendation: modified carbohydrates and low sodium diet   Filed Weights   07/09/17 1800 07/10/17 0521  Weight: 78.5 kg (173 lb) 79 kg (174 lb 3.2 oz)    History of present illness:  76 y.o. female with history of cirrhosis of the liver secondary to [place], history of GAVE status post radiofrequency ablation by Dr. Havery Moros, HTN, anxiety, chronic pain and chronic diastolic HF; patient transferred from her SNF to the hospital for further evaluation. Patient Hgb level  Was found to be 6.9 on admission. She reported feeling tired and weak.     Hospital Course:  1-acute on chronic anemia: secondary to chronic GIB in the setting of GAVE -received 2 units of PRBC's and also IV iron infusion -at discharge Hgb 8.8 -no signs of acute bleeding appreciated currently -case discussed with GI and they will arrange outpatient follow up -at discharge will use niferex BID -recommend CBC to follow Hgb trend   2-type 2 diabetes: chronically on  insulin and with CKD stage 3 -will continue home hypoglycemic regimen  -advise to follow modified carb diet  -outpatient follow up of her A1C and CBG fluctuations  3- CKD stage 3 -stable and well controlled -advise to maintain proper hydration  -recommend outpatient BMET to follow renal function trend   4-COPD -stable overall -continue home inhaler/nebulizer regimen   5-cirrhosis of the liver -continue xifaxan and lactulose -also continue torsemide and low sodium diet -condition is stable  6-chronic diastolic HF -continue daily weights, low sodium diet and home diuretics  7-HLD -continue statins   8-chronic thrombocytopenia -due to cirrhosis most likely -no signs of acute bleeding -will recommend CBC to follow up platelets count   9-anxiety/chronic pain -stable and well controlled -will continue home medications for these conditions   Procedures: None   Consultations:  GI curbside (recommended continue supportive care and they will set up outpatient follow up)  Discharge Exam: Vitals:   07/10/17 0813 07/10/17 0819  BP:    Pulse:    Resp:    Temp:    SpO2: 98% 98%    General: afebrile, denies CP and reports SOB is at baseline. Feeling better overall after transfusion, even still some tiredness reported. Chronically using oxygen supplementation through [facility]: S1 and s2, no rubs, or gallops, no JVD Respiratory: no crackles, no rhonchi, fair air movement, mild exp wheezing,  Discharge Instructions   Discharge Instructions    Diet - low sodium heart healthy   Complete by:  As directed    Discharge instructions   Complete by:  As directed    Take medications as prescribed  Maintain adequate hydration Repeat CBC in 1 week to follow Hgb trend  Follow low sodium diet     Current Discharge Medication List    START taking these medications   Details  iron polysaccharides (NIFEREX) 150 MG capsule Take 1 capsule (150 mg total) 2 (two) times  daily by mouth.      CONTINUE these medications which have CHANGED   Details  ALPRAZolam (XANAX) 0.5 MG tablet Take 1 tablet (0.5 mg total) 2 (two) times daily as needed by mouth for anxiety. Qty: 5 tablet, Refills: 0    oxyCODONE (OXY IR/ROXICODONE) 5 MG immediate release tablet Take 1 tablet (5 mg total) every 6 (six) hours as needed by mouth for moderate pain or severe pain. Qty: 10 tablet, Refills: 0    traMADol (ULTRAM) 50 MG tablet Take 1 tablet (50 mg total) every 12 (twelve) hours as needed by mouth for moderate pain. Not to be given with Xanax. Qty: 5 tablet, Refills: 0      CONTINUE these medications which have NOT CHANGED   Details  ADVAIR DISKUS 250-50 MCG/DOSE AEPB INHALE 1 PUFF INTO LUNGS TWICE A DAY Qty: 60 each, Refills: 11    albuterol (PROVENTIL) (2.5 MG/3ML) 0.083% nebulizer solution Take 2.5 mg by nebulization every 6 (six) hours as needed for wheezing or shortness of breath.    atorvastatin (LIPITOR) 20 MG tablet Take 20 mg by mouth daily.     buPROPion (WELLBUTRIN XL) 150 MG 24 hr tablet Take 150 mg by mouth daily.    Darbepoetin Alfa (ARANESP) 100 MCG/0.5ML SOSY injection Inject 100 mcg every 14 (fourteen) days into the skin.    dicyclomine (BENTYL) 20 MG tablet Take 1 tablet (20 mg total) by mouth 2 (two) times daily. Qty: 20 tablet, Refills: 0    Insulin Glargine (LANTUS SOLOSTAR) 100 UNIT/ML Solostar Pen Inject 60 Units into the skin daily at 10 pm.     isosorbide mononitrate (IMDUR) 30 MG 24 hr tablet Take 1 tablet (30 mg total) by mouth daily. Qty: 30 tablet, Refills: 6   Associated Diagnoses: Precordial pain    lactulose (CHRONULAC) 10 GM/15ML solution Take 10 g by mouth 4 (four) times daily.    levalbuterol (XOPENEX) 0.63 MG/3ML nebulizer solution Take 0.63 mg by nebulization 2 (two) times daily.  Refills: 5    Multiple Vitamin (MULTIVITAMIN WITH MINERALS) TABS tablet Take 1 tablet by mouth daily.     nitroGLYCERIN (NITROSTAT) 0.4 MG SL tablet  Place 1 tablet (0.4 mg total) under the tongue every 5 (five) minutes as needed for chest pain. No more than 3 in 15 minutes Qty: 25 tablet, Refills: 1   Associated Diagnoses: Chronic diastolic heart failure (HCC)    OXYGEN Inhale 2 L into the lungs continuous.     pantoprazole (PROTONIX) 40 MG tablet TAKE 1 TABLET BY MOUTH TWICE A DAY Qty: 60 tablet, Refills: 11    potassium chloride SA (K-DUR,KLOR-CON) 20 MEQ tablet Take 20 mEq by mouth daily.    promethazine (PHENERGAN) 12.5 MG tablet Take 12.5 mg by mouth every 12 (twelve) hours.    Propylene Glycol (SYSTANE BALANCE) 0.6 % SOLN Place 1 drop into both eyes every morning.    QUEtiapine (SEROQUEL) 100 MG tablet Take 100 mg by mouth at bedtime.    rifaximin (XIFAXAN) 550 MG TABS tablet Take 550 mg by mouth 2 (two) times daily.    rOPINIRole (REQUIP) 4 MG tablet Take 4  mg by mouth at bedtime.    Skin Protectants, Misc. (EUCERIN) cream Apply 1 application daily topically. Apply to nasal septum bilaterally to prevent epistaxis from oxygen-induced drying     sucralfate (CARAFATE) 1 GM/10ML suspension Take 1 g by mouth every 6 (six) hours as needed.    torsemide (DEMADEX) 20 MG tablet Take 40 mg by mouth daily.    valproic acid (DEPAKENE) 250 MG capsule Take 250 mg by mouth 2 (two) times daily.      STOP taking these medications     cefpodoxime (VANTIN) 100 MG tablet      ferrous gluconate (FERGON) 324 MG tablet        Allergies  Allergen Reactions  . Acetaminophen Other (See Comments)    Liver dysfunction  . Nsaids Other (See Comments)    Liver dysfunction   . Aspirin Other (See Comments)    Causes nosebleeds  . Theophylline Nausea And Vomiting  . Penicillin G Itching  . Penicillins Itching and Other (See Comments)    Has patient had a PCN reaction causing immediate rash, facial/tongue/throat swelling, SOB or lightheadedness with hypotension: unknown Has patient had a PCN reaction causing severe rash involving mucus  membranes or skin necrosis: unknown Has patient had a PCN reaction that required hospitalization unknown Has patient had a PCN reaction occurring within the last 10 years: yes If all of the above answers are "NO", then may proceed with Cephalosporin use.     Contact information for follow-up providers    Garwin Brothers, MD. Schedule an appointment as soon as possible for a visit in 2 week(s).   Specialty:  Internal Medicine Contact information: [place] [address] [phone]            Contact information for after-discharge care    Destination    [facility] Follow up.   Service:  Skilled Nursing Contact information: [address] [place] [phone]                  The results of significant diagnostics from this hospitalization (including imaging, microbiology, ancillary and laboratory) are listed below for reference.    Significant Diagnostic Studies: Dg Chest 2 View  Result Date: 07/02/2017 CLINICAL DATA:  Fever with cough EXAM: CHEST  2 VIEW COMPARISON:  06/13/2017 FINDINGS: Cardiomegaly with aortic atherosclerosis. Mild central vascular congestion without pneumonic consolidation. Left perihilar atelectasis and/or scarring. No effusion or pneumothorax. Chronic right seventh and ninth rib fractures. Mild chronic degenerative change along the dorsal spine. No acute osseous abnormality. IMPRESSION: Cardiomegaly with aortic atherosclerosis. Mild pulmonary vascular congestion without acute pneumonic consolidation. Electronically Signed   By: Ashley Royalty M.D.   On: 07/02/2017 21:18   Dg Chest 2 View  Result Date: 06/13/2017 CLINICAL DATA:  Shortness breath, chest pain EXAM: CHEST  2 VIEW COMPARISON:  05/30/2017 FINDINGS: The heart size and mediastinal contours are within normal limits. Both lungs are clear. The visualized skeletal structures are unremarkable. IMPRESSION: No active cardiopulmonary  disease. Electronically Signed   By: Kathreen Devoid   On: 06/13/2017 20:10    Microbiology: Recent Results (from the past 240 hour(s))  Urine culture     Status: Abnormal   Collection Time: 07/02/17  9:46 PM  Result Value Ref Range Status   Specimen Description URINE, CLEAN CATCH  Final   Special Requests NONE  Final   Culture >=100,000 COLONIES/mL ENTEROBACTER CLOACAE (A)  Final   Report  Status 07/06/2017 FINAL  Final   Organism ID, Bacteria ENTEROBACTER CLOACAE (A)  Final      Susceptibility   Enterobacter cloacae - MIC*    CEFAZOLIN >=64 RESISTANT Resistant     CEFTRIAXONE 8 SENSITIVE Sensitive     CIPROFLOXACIN 2 INTERMEDIATE Intermediate     GENTAMICIN >=16 RESISTANT Resistant     IMIPENEM <=0.25 SENSITIVE Sensitive     NITROFURANTOIN <=16 SENSITIVE Sensitive     TRIMETH/SULFA >=320 RESISTANT Resistant     PIP/TAZO 32 INTERMEDIATE Intermediate     * >=100,000 COLONIES/mL ENTEROBACTER CLOACAE     Labs: Basic Metabolic Panel: Recent Labs  Lab 07/09/17 1849 07/10/17 0704  NA 135 139  K 3.5 3.9  CL 102 107  CO2 25 28  GLUCOSE 288* 290*  BUN 22* 25*  CREATININE 1.25* 1.13*  CALCIUM 8.4* 8.6*   Liver Function Tests: Recent Labs  Lab 07/09/17 1849  AST 29  ALT 22  ALKPHOS 115  BILITOT 1.0  PROT 4.6*  ALBUMIN 2.4*   CBC: Recent Labs  Lab 07/09/17 1849 07/10/17 0704  WBC 3.1* 3.0*  NEUTROABS 2.3  --   HGB 6.9* 8.8*  HCT 21.3* 27.1*  MCV 98.2 96.1  PLT 73* 72*   BNP (last 3 results) Recent Labs    06/13/17 1826  BNP 78.8   CBG: Recent Labs  Lab 07/09/17 1839 07/10/17 0004 07/10/17 0759 07/10/17 1159  GLUCAP 256* 300* 281* 270*    Signed:  Barton Dubois MD.  [facility] 07/10/2017, 1:30 PM

## 2017-07-10 NOTE — Care Management Obs Status (Addendum)
MEDICARE OBSERVATION STATUS NOTIFICATION   Patient Details  Name: [name] MRN: [id number] Date of Birth: [birth year]   Medicare Observation Status Notification Given:  Yes Pt drowsy and only signs electronically with an "X".   Lynnell Catalan, RN 07/10/2017, 12:59 PM

## 2017-07-10 NOTE — Progress Notes (Signed)
Pt discharged and will return to [facility] bed 201, report [phone].  Daughter at bedside aware of plan. Pt will need [facility] transport- CSW arranged and completed medical necessity form.   All information provided to facility via the [place]. No FL2 needed as pt was admitted under observation status <24 hrs per facility.  Sharren Bridge, MSW, LCSW Clinical Social Work 07/10/2017 [phone]

## 2017-07-10 NOTE — Progress Notes (Signed)
CSW following as pt is admitted from facility- [facility] where she is a long term resident. Pt and her family known to CSW from previous admissions.  Spoke with pt and facility- plan for pt to return to [facility] at [place]. Will follow and assist.  See last complete CSW assessment completed 06/27/17- no significant changes in psychosocial situation.  Sharren Bridge, MSW, LCSW Clinical Social Work 07/10/2017 [phone]    Clinical Social Work Assessment  Patient Details  Name: [name] MRN: [id number] Date of Birth: 1940-10-15  Date of referral:  06/27/17               Reason for consult:  Discharge Planning                           Permission sought to share information with:  Facility Sport and exercise psychologist, Family Supports Permission granted to share information::  Yes, Verbal Permission Granted             Name::                   Agency::  [facility]             Relationship::  Daughter             Contact Information:     Housing/Transportation Living arrangements for the past 2 months:  [place] of Information:  Patient Patient Interpreter Needed:  None Criminal Activity/Legal Involvement Pertinent to Current Situation/Hospitalization:  No - Comment as needed Significant Relationships:  Adult Children Lives with:  Self Do you feel safe going back to the place where you live?  No Need for family participation in patient care:  Yes (Comment)  Care giving concerns:  CSW received consult for possible return to SNF placement at time of discharge.  Patient plans on returning to [facility] at discharge. CSW to continue to follow and assist with discharge planning needs.   Social Worker assessment / plan:  CSW spoke with patient concerning return to [facility] and left a voicemail for patient's daughter.  Employment status:  Retired Nurse, adult PT Recommendations:  Not assessed at this time Information /  Referral to community resources:  [place]  Patient/Family's Response to care:  Patient expresses agreement with discharge to [facility] by [employer].  Patient/Family's Understanding of and Emotional Response to Diagnosis, Current Treatment, and Prognosis:  Patient/family is realistic regarding therapy needs and expressed being hopeful for return to SNF placement. Patient expressed understanding of CSW role and discharge process as well as medical condition. No questions/concerns about plan or treatment.    Emotional Assessment Appearance:  Appears stated age Attitude/Demeanor/Rapport:  Other (Appropriate) Affect (typically observed):  Accepting, Appropriate Orientation:  Oriented to Self, Oriented to Place, Oriented to Situation Alcohol / Substance use:  Not Applicable Psych involvement (Current and /or in the community):  No (Comment)  Discharge Needs  Concerns to be addressed:  Care Coordination Readmission within the last 30 days:  Yes Current discharge risk:  None Barriers to Discharge:  No Barriers Identified   [name], LCSWA 06/27/2017, 4:23 PM

## 2017-07-10 NOTE — Progress Notes (Signed)
D/c'd to SNF vai [facility]

## 2017-07-11 ENCOUNTER — Ambulatory Visit (HOSPITAL_COMMUNITY): Admission: RE | Admit: 2017-07-11 | Payer: Medicare Other | Source: Ambulatory Visit

## 2017-07-11 LAB — TYPE AND SCREEN
ABO/RH(D): O POS
Antibody Screen: NEGATIVE
Donor AG Type: NEGATIVE
Donor AG Type: NEGATIVE
UNIT DIVISION: 0
UNIT DIVISION: 0

## 2017-07-11 LAB — BPAM RBC
BLOOD PRODUCT EXPIRATION DATE: 201812122359
BLOOD PRODUCT EXPIRATION DATE: 201812142359
ISSUE DATE / TIME: 201811142259
ISSUE DATE / TIME: 201811150129
UNIT TYPE AND RH: 5100
Unit Type and Rh: 5100

## 2017-07-15 ENCOUNTER — Observation Stay (HOSPITAL_COMMUNITY)
Admission: EM | Admit: 2017-07-15 | Discharge: 2017-07-16 | Disposition: A | Discharge status: Home / Self Care | Payer: Medicare Other | Attending: Internal Medicine | Admitting: Internal Medicine

## 2017-07-15 ENCOUNTER — Encounter (HOSPITAL_COMMUNITY): Payer: Self-pay | Admitting: Emergency Medicine

## 2017-07-15 DIAGNOSIS — K5520 Angiodysplasia of colon without hemorrhage: Secondary | ICD-10-CM

## 2017-07-15 DIAGNOSIS — R231 Pallor: Secondary | ICD-10-CM | POA: Insufficient documentation

## 2017-07-15 DIAGNOSIS — I130 Hypertensive heart and chronic kidney disease with heart failure and stage 1 through stage 4 chronic kidney disease, or unspecified chronic kidney disease: Secondary | ICD-10-CM | POA: Insufficient documentation

## 2017-07-15 DIAGNOSIS — N183 Chronic kidney disease, stage 3 unspecified: Secondary | ICD-10-CM | POA: Diagnosis present

## 2017-07-15 DIAGNOSIS — I5032 Chronic diastolic (congestive) heart failure: Secondary | ICD-10-CM | POA: Diagnosis not present

## 2017-07-15 DIAGNOSIS — R109 Unspecified abdominal pain: Secondary | ICD-10-CM | POA: Insufficient documentation

## 2017-07-15 DIAGNOSIS — Z79899 Other long term (current) drug therapy: Secondary | ICD-10-CM | POA: Diagnosis not present

## 2017-07-15 DIAGNOSIS — K7460 Unspecified cirrhosis of liver: Secondary | ICD-10-CM | POA: Diagnosis not present

## 2017-07-15 DIAGNOSIS — J449 Chronic obstructive pulmonary disease, unspecified: Secondary | ICD-10-CM | POA: Insufficient documentation

## 2017-07-15 DIAGNOSIS — Z87891 Personal history of nicotine dependence: Secondary | ICD-10-CM | POA: Insufficient documentation

## 2017-07-15 DIAGNOSIS — E1122 Type 2 diabetes mellitus with diabetic chronic kidney disease: Secondary | ICD-10-CM | POA: Diagnosis not present

## 2017-07-15 DIAGNOSIS — J9611 Chronic respiratory failure with hypoxia: Secondary | ICD-10-CM

## 2017-07-15 DIAGNOSIS — D696 Thrombocytopenia, unspecified: Secondary | ICD-10-CM

## 2017-07-15 DIAGNOSIS — E119 Type 2 diabetes mellitus without complications: Secondary | ICD-10-CM

## 2017-07-15 DIAGNOSIS — Z794 Long term (current) use of insulin: Secondary | ICD-10-CM | POA: Diagnosis not present

## 2017-07-15 DIAGNOSIS — D62 Acute posthemorrhagic anemia: Secondary | ICD-10-CM | POA: Diagnosis not present

## 2017-07-15 DIAGNOSIS — J45909 Unspecified asthma, uncomplicated: Secondary | ICD-10-CM | POA: Insufficient documentation

## 2017-07-15 DIAGNOSIS — K31819 Angiodysplasia of stomach and duodenum without bleeding: Secondary | ICD-10-CM | POA: Diagnosis not present

## 2017-07-15 DIAGNOSIS — R5383 Other fatigue: Secondary | ICD-10-CM | POA: Insufficient documentation

## 2017-07-15 DIAGNOSIS — Z66 Do not resuscitate: Secondary | ICD-10-CM | POA: Diagnosis present

## 2017-07-15 DIAGNOSIS — K922 Gastrointestinal hemorrhage, unspecified: Secondary | ICD-10-CM | POA: Diagnosis present

## 2017-07-15 DIAGNOSIS — D508 Other iron deficiency anemias: Principal | ICD-10-CM | POA: Insufficient documentation

## 2017-07-15 DIAGNOSIS — D500 Iron deficiency anemia secondary to blood loss (chronic): Secondary | ICD-10-CM | POA: Diagnosis present

## 2017-07-15 DIAGNOSIS — R0602 Shortness of breath: Secondary | ICD-10-CM | POA: Diagnosis not present

## 2017-07-15 DIAGNOSIS — K7581 Nonalcoholic steatohepatitis (NASH): Secondary | ICD-10-CM

## 2017-07-15 LAB — COMPREHENSIVE METABOLIC PANEL
ALBUMIN: 2.4 g/dL — AB (ref 3.5–5.0)
ALT: 21 U/L (ref 14–54)
ANION GAP: 6 (ref 5–15)
AST: 31 U/L (ref 15–41)
Alkaline Phosphatase: 99 U/L (ref 38–126)
BUN: 33 mg/dL — ABNORMAL HIGH (ref 6–20)
CALCIUM: 8.2 mg/dL — AB (ref 8.9–10.3)
CO2: 30 mmol/L (ref 22–32)
Chloride: 104 mmol/L (ref 101–111)
Creatinine, Ser: 1.38 mg/dL — ABNORMAL HIGH (ref 0.44–1.00)
GFR calc non Af Amer: 37 mL/min — ABNORMAL LOW (ref >60)
GFR, EST AFRICAN AMERICAN: 43 mL/min — AB (ref >60)
GLUCOSE: 175 mg/dL — AB (ref 65–99)
POTASSIUM: 4 mmol/L (ref 3.5–5.1)
SODIUM: 140 mmol/L (ref 135–145)
TOTAL PROTEIN: 4.6 g/dL — AB (ref 6.5–8.1)
Total Bilirubin: 1 mg/dL (ref 0.3–1.2)

## 2017-07-15 LAB — CBC
HEMATOCRIT: 21.9 % — AB (ref 36.0–46.0)
HEMOGLOBIN: 7 g/dL — AB (ref 12.0–15.0)
MCH: 32.1 pg (ref 26.0–34.0)
MCHC: 32 g/dL (ref 30.0–36.0)
MCV: 100.5 fL — ABNORMAL HIGH (ref 78.0–100.0)
Platelets: 63 10*3/uL — ABNORMAL LOW (ref 150–400)
RBC: 2.18 MIL/uL — AB (ref 3.87–5.11)
RDW: 18.3 % — ABNORMAL HIGH (ref 11.5–15.5)
WBC: 2.5 10*3/uL — ABNORMAL LOW (ref 4.0–10.5)

## 2017-07-15 LAB — GLUCOSE, CAPILLARY: GLUCOSE-CAPILLARY: 147 mg/dL — AB (ref 65–99)

## 2017-07-15 LAB — PREPARE RBC (CROSSMATCH)

## 2017-07-15 MED ORDER — ADULT MULTIVITAMIN W/MINERALS CH
1.0000 | ORAL_TABLET | Freq: Every day | ORAL | Status: DC
  Administered 2017-07-16: 1 via ORAL
  Filled 2017-07-15 (×2): qty 1

## 2017-07-15 MED ORDER — SODIUM CHLORIDE 0.9 % IV SOLN
10.0000 mL/h | Freq: Once | INTRAVENOUS | Status: AC
  Administered 2017-07-16: 10 mL/h via INTRAVENOUS

## 2017-07-15 MED ORDER — FERUMOXYTOL INJECTION 510 MG/17 ML
510.0000 mg | INTRAVENOUS | Status: DC
  Administered 2017-07-15: 510 mg via INTRAVENOUS
  Filled 2017-07-15: qty 17

## 2017-07-15 MED ORDER — INSULIN ASPART 100 UNIT/ML ~~LOC~~ SOLN
0.0000 [IU] | Freq: Three times a day (TID) | SUBCUTANEOUS | Status: DC
  Administered 2017-07-16: 3 [IU] via SUBCUTANEOUS
  Administered 2017-07-16: 2 [IU] via SUBCUTANEOUS

## 2017-07-15 MED ORDER — INSULIN GLARGINE 100 UNIT/ML ~~LOC~~ SOLN
40.0000 [IU] | Freq: Every day | SUBCUTANEOUS | Status: DC
  Administered 2017-07-15: 40 [IU] via SUBCUTANEOUS
  Filled 2017-07-15 (×2): qty 0.4

## 2017-07-15 MED ORDER — PANTOPRAZOLE SODIUM 40 MG PO TBEC
40.0000 mg | DELAYED_RELEASE_TABLET | Freq: Two times a day (BID) | ORAL | Status: DC
  Administered 2017-07-15 – 2017-07-16 (×2): 40 mg via ORAL
  Filled 2017-07-15 (×2): qty 1

## 2017-07-15 MED ORDER — DICYCLOMINE HCL 20 MG PO TABS
20.0000 mg | ORAL_TABLET | Freq: Two times a day (BID) | ORAL | Status: DC
  Administered 2017-07-15 – 2017-07-16 (×2): 20 mg via ORAL
  Filled 2017-07-15 (×3): qty 1

## 2017-07-15 MED ORDER — BUPROPION HCL ER (XL) 150 MG PO TB24
150.0000 mg | ORAL_TABLET | Freq: Every day | ORAL | Status: DC
  Administered 2017-07-16: 150 mg via ORAL
  Filled 2017-07-15 (×2): qty 1

## 2017-07-15 MED ORDER — ALPRAZOLAM 0.5 MG PO TABS: 0.5000 mg | ORAL_TABLET | Freq: Two times a day (BID) | ORAL | Status: DC | PRN

## 2017-07-15 MED ORDER — ALBUTEROL SULFATE (2.5 MG/3ML) 0.083% IN NEBU: 2.5000 mg | INHALATION_SOLUTION | Freq: Four times a day (QID) | RESPIRATORY_TRACT | Status: DC | PRN

## 2017-07-15 MED ORDER — OXYCODONE HCL 5 MG PO TABS
5.0000 mg | ORAL_TABLET | Freq: Four times a day (QID) | ORAL | Status: DC | PRN
  Administered 2017-07-16 (×2): 5 mg via ORAL
  Filled 2017-07-15 (×2): qty 1

## 2017-07-15 MED ORDER — LACTULOSE 10 GM/15ML PO SOLN
10.0000 g | Freq: Four times a day (QID) | ORAL | Status: DC
  Administered 2017-07-15 – 2017-07-16 (×3): 10 g via ORAL
  Filled 2017-07-15 (×3): qty 15

## 2017-07-15 MED ORDER — ATORVASTATIN CALCIUM 20 MG PO TABS
20.0000 mg | ORAL_TABLET | Freq: Every day | ORAL | Status: DC
  Administered 2017-07-15 – 2017-07-16 (×2): 20 mg via ORAL
  Filled 2017-07-15 (×2): qty 1

## 2017-07-15 MED ORDER — LEVALBUTEROL HCL 0.63 MG/3ML IN NEBU
0.6300 mg | INHALATION_SOLUTION | Freq: Two times a day (BID) | RESPIRATORY_TRACT | Status: DC
  Administered 2017-07-15: 0.63 mg via RESPIRATORY_TRACT
  Filled 2017-07-15 (×2): qty 3

## 2017-07-15 MED ORDER — MOMETASONE FURO-FORMOTEROL FUM 200-5 MCG/ACT IN AERO
2.0000 | INHALATION_SPRAY | Freq: Two times a day (BID) | RESPIRATORY_TRACT | Status: DC
  Administered 2017-07-15: 2 via RESPIRATORY_TRACT
  Filled 2017-07-15: qty 8.8

## 2017-07-15 MED ORDER — ISOSORBIDE MONONITRATE ER 30 MG PO TB24
30.0000 mg | ORAL_TABLET | Freq: Every day | ORAL | Status: DC
  Administered 2017-07-16: 30 mg via ORAL
  Filled 2017-07-15 (×2): qty 1

## 2017-07-15 NOTE — ED Triage Notes (Signed)
Per nursing facility, needs to be transfused Hgb 8-last transfusion was 11/14

## 2017-07-15 NOTE — H&P (Signed)
History and Physical    [name]:[id number] DOB: [birth year] DOA: 07/15/2017  I have briefly reviewed the patient's prior medical records in [place]  PCP: Posey Boyer, MD  Patient coming from: [facility]  Chief Complaint: Anemia  HPI: [name] is a 76 y.o. female with medical history significant of [name] cirrhosis, history of recurrent upper GI bleeding due to [place] well-known to [facility] gastroenterology, chronic diastolic CHF, COPD with chronic hypoxic respiratory failure, obesity, hyperlipidemia, who is being brought to the hospital from SNF due to anemia.  Patient states that she feels well, may be a little bit weaker but very close to her baseline.  She denies any abdominal pain, has been complaining of chronic nausea with intermittent vomiting which is unchanged.  Denies any constipation or diarrhea.  She has had all 4 hospitalizations in the last 2 months for symptomatic anemia requiring blood transfusions.  Most recently seen by GI about 1-1/2 weeks ago, and recently had RFA in October 2018.  Currently she denies any chest pain, is complaining of shortness of breath which again is chronic and has not changed recently.  She has no fevers or chills.  ED Course: In the emergency room she is afebrile, heart rate is in the 60s, her blood pressure is within normal limits and she is satting well on 2 L nasal cannula.  Blood work reveals a hemoglobin of 7.0, up from 8.8 when she was discharged just 5 days ago.  She was ordered 2 units of packed red blood cells and we were asked to admit.  Review of Systems: As per HPI otherwise 10 point review of systems negative.   Past Medical History:  Diagnosis Date  . Adenomatous colon polyp   . Anxiety   . Asthma   . Back pain   . Benign neoplasm of colon   . CAP (community acquired pneumonia) 2015  . Carpal tunnel syndrome on both sides   . Chronic airway obstruction, not elsewhere classified   . Chronic diastolic CHF  (congestive heart failure) ([place])    a. 03/26/2014 Echo: EF 55-60%, no rwma, mild AS/AI, mod-sev Ca2+ MV annulus, mildly to mod dil LA.  [name] Coarse tremors    a. arms.  . Diverticulosis of colon (without mention of hemorrhage)   . Esophageal reflux   . Falls frequently    completed HHPT/OT 06/2014, PT unmet goals  . GAVE (gastric antral vascular ectasia)    angiodysplasia; s/p multiple APCs, discussing RFA with [facility] Dr Newman Pies (06/2015)  . H/O hiatal hernia   . Hepatitis   . Hiatal hernia   . Hyperlipidemia   . Iron deficiency anemia secondary to blood loss (chronic)    a. frequent PRBC transfusions.  . Liver cirrhosis secondary to nonalcoholic steatohepatitis (NASH) ([place])    a. dx'd 1990's  . Major depressive disorder, recurrent episode, severe, specified as with psychotic behavior   . Memory loss   . Midsternal chest pain    a. conservatively managed ->poor candidate for cath/anticoagulation.  . Mild aortic stenosis    a. 03/2014 Valve area (VTI): 2.89 cm^2, Valve area (Vmax): 2.7 cm^2.  . Morbid obesity ([place])   . Neck pain   . Non-obstructive CAD   . On home oxygen therapy    "3L; 24/7" (11/29/2015)  . OSA (obstructive sleep apnea)    a. does not use CPAP. (01/25/2015)  . Osteoarthrosis, unspecified whether generalized or localized, unspecified site   . Osteoporosis,  unspecified   . Palpitations   . Portal hypertensive gastropathy ([place])   . Protein calorie malnutrition ([place])   . Recurrent UTI (urinary tract infection)    h/o hospitalization with urosepsis 2015, but thought large component colonization/bacteriuria, only treat if symptomatic ([place])  . Restless legs syndrome (RLS)   . Schizophrenia ([place])   . Thrombocytopenia ([place])   . Type II diabetes mellitus ([place])   . Unspecified chronic bronchitis ([place])   . Unspecified essential hypertension     Past Surgical History:  Procedure Laterality Date  . APPENDECTOMY    . BALLOON DILATION  06/12/2012   Procedure:  BALLOON DILATION;  Surgeon: Lafayette Dragon, MD;  Location: [facility] ENDOSCOPY;  Service: Endoscopy;  Laterality: N/A;  ?balloon  . BREAST BIOPSY     bilateral  . CESAREAN SECTION     x 3  . ENTEROSCOPY N/A 02/25/2015   Procedure: ENTEROSCOPY;  Surgeon: [name] Ada, MD;  Location: [facility] ENDOSCOPY;  Service: Endoscopy;  Laterality: N/A;  . ENTEROSCOPY N/A 06/27/2015   Procedure: ENTEROSCOPY;  Surgeon: Manus Gunning, MD;  Location: [facility] ENDOSCOPY;  Service: Gastroenterology;  Laterality: N/A;  . ESOPHAGOGASTRODUODENOSCOPY  06/12/2012   Procedure: ESOPHAGOGASTRODUODENOSCOPY (EGD);  Surgeon: Lafayette Dragon, MD;  Location: [facility];  Service: Endoscopy;  Laterality: N/A;  . ESOPHAGOGASTRODUODENOSCOPY  09/10/2012   Procedure: ESOPHAGOGASTRODUODENOSCOPY (EGD);  Surgeon: Lafayette Dragon, MD;  Location: [facility] ENDOS[facility];  Service: Endoscopy;  Laterality: N/A;  . ESOPHAGOGASTRODUODENOSCOPY N/A 01/18/2013   Procedure: ESOPHAGOGASTRODUODENOSCOPY (EGD);  Surgeon: Lafayette Dragon, MD;  Location: [facility] ENDOSCOPY;  Service: Endoscopy;  Laterality: N/A;  . ESOPHAGOGASTRODUODENOSCOPY N/A 05/24/2013   Procedure: ESOPHAGOGASTRODUODENOSCOPY (EGD);  Surgeon: Jerene Bears, MD;  Location: [place];  Service: Gastroenterology;  Laterality: N/A;  . ESOPHAGOGASTRODUODENOSCOPY N/A 10/20/2014   Procedure: ESOPHAGOGASTRODUODENOSCOPY (EGD);  Surgeon: Lafayette Dragon, MD;  Location: [facility];  Service: Endoscopy;  Laterality: N/A;  . ESOPHAGOGASTRODUODENOSCOPY N/A 06/18/2017   Procedure: ESOPHAGOGASTRODUODENOSCOPY (EGD) with RFA;  Surgeon: Yetta Flock, MD;  Location: [facility] ENDOSCOPY;  Service: Gastroenterology;  Laterality: N/A;  . EUS  09/2015   antral CAVL, ablated; nodular erosive gastritis; mult pancreatic cysts rec rpt CT pancreas protocol or MRI to survey pancreas cysts 1-2 yrs (Dr Amie Critchley)  . HEMORROIDECTOMY    . HOT HEMOSTASIS  09/10/2012   Procedure: HOT HEMOSTASIS (ARGON PLASMA COAGULATION/BICAP);  Surgeon: Lafayette Dragon, MD;  Location: [facility]OS[facility];  Service: Endoscopy;  Laterality: N/A;  . HOT HEMOSTASIS N/A 05/24/2013   Procedure: HOT HEMOSTASIS (ARGON PLASMA COAGULATION/BICAP);  Surgeon: Jerene Bears, MD;  Location: [place];  Service: Gastroenterology;  Laterality: N/A;  . HOT HEMOSTASIS N/A 10/20/2014   Procedure: HOT HEMOSTASIS (ARGON PLASMA COAGULATION/BICAP);  Surgeon: Lafayette Dragon, MD;  Location: [facility] ENDOS[facility];  Service: Endoscopy;  Laterality: N/A;  . US ECHOCARDIOGRAPHY  07/2014   mild LVH, EF 60-65%, normal wall motion, mild AR, mod dilated LA, mildly dilated RA, peak PA pressure 68mmHg  . VAGINAL HYSTERECTOMY       reports that she quit smoking about 24 years ago. Her smoking use included cigarettes. She has a 37.50 pack-year smoking history. she has never used smokeless tobacco. She reports that she does not drink alcohol or use drugs.  Allergies  Allergen Reactions  . Acetaminophen Other (See Comments)    Liver dysfunction  . Nsaids Other (See Comments)    Liver dysfunction   . Aspirin Other (See Comments)    Causes nosebleeds  . Theophylline Nausea  And Vomiting  . Penicillin G Itching  . Penicillins Itching and Other (See Comments)    Has patient had a PCN reaction causing immediate rash, facial/tongue/throat swelling, SOB or lightheadedness with hypotension: unknown Has patient had a PCN reaction causing severe rash involving mucus membranes or skin necrosis: unknown Has patient had a PCN reaction that required hospitalization unknown Has patient had a PCN reaction occurring within the last 10 years: yes If all of the above answers are "NO", then may proceed with Cephalosporin use.     Family History  Problem Relation Age of Onset  . Heart disease Mother   . Cervical cancer Mother   . Kidney disease Mother   . Diabetes Mother   . Heart attack Mother   . Diabetes Father   . Heart attack Father   . Multiple sclerosis Other   . Breast cancer Sister   . Multiple  sclerosis Sister   . Breast cancer Maternal Aunt        x 2  . Diabetes Sister   . Stroke Daughter   . Stroke Daughter   . Lupus Daughter   . Colon cancer Neg Hx     Prior to Admission medications   Medication Sig Start Date End Date Taking? Authorizing Provider  ADVAIR DISKUS 250-50 MCG/DOSE AEPB INHALE 1 PUFF INTO LUNGS TWICE A DAY 05/29/15   Ria Bush, MD  albuterol (PROVENTIL) (2.5 MG/3ML) 0.083% nebulizer solution Take 2.5 mg by nebulization every 6 (six) hours as needed for wheezing or shortness of breath.    [provider]  ALPRAZolam Duanne Moron) 0.5 MG tablet Take 1 tablet (0.5 mg total) 2 (two) times daily as needed by mouth for anxiety. 07/10/17   Barton Dubois, MD  atorvastatin (LIPITOR) 20 MG tablet Take 20 mg by mouth daily.     [provider]  buPROPion (WELLBUTRIN XL) 150 MG 24 hr tablet Take 150 mg by mouth daily.    [provider]  Darbepoetin Alfa (ARANESP) 100 MCG/0.5ML SOSY injection Inject 100 mcg every 14 (fourteen) days into the skin.    [provider]  dicyclomine (BENTYL) 20 MG tablet Take 1 tablet (20 mg total) by mouth 2 (two) times daily. 04/07/16   Doristine Devoid, PA-C  Insulin Glargine (LANTUS SOLOSTAR) 100 UNIT/ML Solostar Pen Inject 60 Units into the skin daily at 10 pm.     [provider]  iron polysaccharides (NIFEREX) 150 MG capsule Take 1 capsule (150 mg total) 2 (two) times daily by mouth. 07/10/17   Barton Dubois, MD  isosorbide mononitrate (IMDUR) 30 MG 24 hr tablet Take 1 tablet (30 mg total) by mouth daily. 04/01/16   Richardson Dopp T, PA-C  lactulose (CHRONULAC) 10 GM/15ML solution Take 10 g by mouth 4 (four) times daily.    [provider]  levalbuterol Penne Lash) 0.63 MG/3ML nebulizer solution Take 0.63 mg by nebulization 2 (two) times daily.  06/05/14   [provider]  Multiple Vitamin (MULTIVITAMIN WITH MINERALS) TABS tablet Take 1 tablet by mouth daily.     [provider]  nitroGLYCERIN (NITROSTAT) 0.4 MG SL tablet Place 1 tablet (0.4 mg total) under the tongue every 5 (five) minutes as needed for chest pain. No more than 3 in 15 minutes 02/16/16   Sherren Mocha, MD  oxyCODONE (OXY IR/ROXICODONE) 5 MG immediate release tablet Take 1 tablet (5 mg total) every 6 (six) hours as needed by mouth for moderate pain or severe pain. 07/10/17   Dyann Kief,  Clifton James, MD  OXYGEN Inhale 2 L into the lungs continuous.     [provider]  pantoprazole (PROTONIX) 40 MG tablet TAKE 1 TABLET BY MOUTH TWICE A DAY Patient taking differently: TAKE 20MG  BY MOUTH TWICE A DAY 10/12/15   Ria Bush, MD  potassium chloride SA (K-DUR,KLOR-CON) 20 MEQ tablet Take 20 mEq by mouth daily.    [provider]  promethazine (PHENERGAN) 12.5 MG tablet Take 12.5 mg by mouth every 12 (twelve) hours.    [provider]  Propylene Glycol (SYSTANE BALANCE) 0.6 % SOLN Place 1 drop into both eyes every morning.    [provider]  QUEtiapine (SEROQUEL) 100 MG tablet Take 100 mg by mouth at bedtime.    [provider]  rifaximin (XIFAXAN) 550 MG TABS tablet Take 550 mg by mouth 2 (two) times daily.    [provider]  rOPINIRole (REQUIP) 4 MG tablet Take 4 mg by mouth at bedtime.    [provider]  Skin Protectants, Misc. (EUCERIN) cream Apply 1 application daily topically. Apply to nasal septum bilaterally to prevent epistaxis from oxygen-induced drying     [provider]  sucralfate (CARAFATE) 1 GM/10ML suspension Take 1 g by mouth every 6 (six) hours as needed.    [provider]  torsemide (DEMADEX) 20 MG tablet Take 40 mg by mouth daily.    [provider]  traMADol (ULTRAM) 50 MG tablet Take 1 tablet (50 mg total) every 12 (twelve) hours as needed by mouth for moderate pain. Not to be given with Xanax. 07/10/17   Barton Dubois, MD  valproic acid (DEPAKENE) 250 MG capsule Take 250 mg by mouth 2  (two) times daily.    [provider]    Physical Exam: Vitals:   07/15/17 1624 07/15/17 1811  BP: (!) 140/54   Pulse: 68   Resp: 20   Temp: 98.2 F (36.8 C) 98.2 F (36.8 C)  TempSrc: Oral Rectal  SpO2: 92%     Constitutional: NAD, calm, comfortable, pale appearing Eyes: PERRL, lids and conjunctivae normal ENMT: Mucous membranes are moist.  Neck: normal, supple Respiratory: clear to auscultation bilaterally, no wheezing, no crackles. Normal respiratory effort. No accessory muscle use.  Cardiovascular: Regular rate and rhythm, no murmurs / rubs / gallops. Trace lower extremity edema. 2+ pedal pulses.  Abdomen: no tenderness, no masses palpated. Bowel sounds positive.  Musculoskeletal: no clubbing / cyanosis. Normal muscle tone.  Skin: no rashes, lesions, ulcers. No induration Neurologic: CN 2-12 grossly intact. Strength 5/5 in all 4.  Psychiatric: Normal judgment and insight. Alert and oriented x 3. Normal mood.   Labs on Admission: I have personally reviewed following labs and imaging studies  CBC: Recent Labs  Lab 07/09/17 1849 07/10/17 0704 07/15/17 1733  WBC 3.1* 3.0* 2.5*  NEUTROABS 2.3  --   --   HGB 6.9* 8.8* 7.0*  HCT 21.3* 27.1* 21.9*  MCV 98.2 96.1 100.5*  PLT 73* 72* 63*   Basic Metabolic Panel: Recent Labs  Lab 07/09/17 1849 07/10/17 0704 07/15/17 1733  NA 135 139 140  K 3.5 3.9 4.0  CL 102 107 104  CO2 25 28 30   GLUCOSE 288* 290* 175*  BUN 22* 25* 33*  CREATININE 1.25* 1.13* 1.38*  CALCIUM 8.4* 8.6* 8.2*   GFR: Estimated Creatinine Clearance: 35.4 mL/min (A) (by C-G formula based on SCr of 1.38 mg/dL (H)). Liver Function Tests: Recent Labs  Lab 07/09/17 1849 07/15/17 1733  AST 29 31  ALT 22 21  ALKPHOS 115 99  BILITOT 1.0 1.0  PROT 4.6* 4.6*  ALBUMIN 2.4* 2.4*   No results for input(s): LIPASE, AMYLASE in the last 168 hours. No results for input(s): AMMONIA in the last 168 hours. Coagulation Profile: Recent Labs  Lab  07/09/17 1849  INR 1.11   Cardiac Enzymes: No results for input(s): CKTOTAL, CKMB, CKMBINDEX, TROPONINI in the last 168 hours. BNP (last 3 results) No results for input(s): PROBNP in the last 8760 hours. HbA1C: No results for input(s): HGBA1C in the last 72 hours. CBG: Recent Labs  Lab 07/09/17 1839 07/10/17 0004 07/10/17 0759 07/10/17 1159  GLUCAP 256* 300* 281* 270*   Lipid Profile: No results for input(s): CHOL, HDL, LDLCALC, TRIG, CHOLHDL, LDLDIRECT in the last 72 hours. Thyroid Function Tests: No results for input(s): TSH, T4TOTAL, FREET4, T3FREE, THYROIDAB in the last 72 hours. Anemia Panel: No results for input(s): VITAMINB12, FOLATE, FERRITIN, TIBC, IRON, RETICCTPCT in the last 72 hours. Urine analysis:    Component Value Date/Time   COLORURINE YELLOW 07/02/2017 2140   APPEARANCEUR CLEAR 07/02/2017 2140   LABSPEC 1.010 07/02/2017 2140   PHURINE 6.0 07/02/2017 2140   GLUCOSEU NEGATIVE 07/02/2017 2140   GLUCOSEU NEGATIVE 07/17/2015 0907   HGBUR NEGATIVE 07/02/2017 2140   BILIRUBINUR NEGATIVE 07/02/2017 2140   BILIRUBINUR negative 02/26/2016 1206   KETONESUR NEGATIVE 07/02/2017 2140   PROTEINUR NEGATIVE 07/02/2017 2140   UROBILINOGEN 4.0 02/26/2016 1206   UROBILINOGEN 0.2 07/17/2015 0907   NITRITE NEGATIVE 07/02/2017 2140   LEUKOCYTESUR SMALL (A) 07/02/2017 2140     Radiological Exams on Admission: No results found.   Assessment/Plan Active Problems:   Angiodysplasia of intestine   GAVE (gastric antral vascular ectasia)   CKD stage 3 secondary to diabetes ([place])   Thrombocytopenia (HCC)   DNR (do not resuscitate)   Liver cirrhosis secondary to NASH ([place])   Chronic hypoxemic respiratory failure (HCC)   Chronic diastolic CHF (congestive heart failure) (HCC)   Diabetes (HCC)   Anemia due to chronic blood loss   Acute blood loss anemia   GI bleed   Upper GI bleed due to GAVE -Chronic problem, but she is relatively asymptomatic, transfused 2 units of  packed red blood cells -Consider calling GI in the morning, however daughter who is at bedside does not think that they will have much else to offer.  When she is saw Alonza Bogus about a week and a half ago, it appears that if the latest RFA did not work to consider enrolling in hospice.  For now patient does want to have transfusions and medical care.  [place] cirrhosis -With prior hepatic encephalopathy, on lactulose, continue -With thrombocytopenia, her platelets seem relatively stable  Type 2 diabetes mellitus -Resume Lantus at a lower dose than home, provide sliding scale  Chronic kidney disease stage III -Creatinine appears close to baseline, monitor in the morning  Chronic diastolic CHF -Overall she appears euvolemic  COPD with chronic hypoxic respiratory failure -Stable, no wheezing, continue home medications   DVT prophylaxis: SCDs  Code Status: DNR  Family Communication: daughter at bedside Disposition Plan: admit to [place] called: none     Admission status: observation   At the point of initial evaluation, it is my clinical opinion that admission for OBSERVATION is reasonable and necessary because the patient's presenting complaints in the context of their chronic conditions represent sufficient risk of deterioration or significant morbidity to constitute reasonable grounds for close observation in the hospital setting, but  that the patient may be medically stable for discharge from the hospital within 24 to 48 hours.     Marzetta Board, MD [facility] Pager [phone]  If 7PM-7AM, please contact night-coverage [email or web address] Password [facility]  07/15/2017, 7:14 PM

## 2017-07-15 NOTE — ED Notes (Signed)
Coming from nursing facility-needs to be transfused and may return back to facility-Hgb 8.1-history of the same

## 2017-07-15 NOTE — ED Notes (Signed)
Bed: WA02 Expected date:  Expected time:  Means of arrival:  Comments: Hold for hall A
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
## 2017-07-16 ENCOUNTER — Other Ambulatory Visit: Payer: Self-pay

## 2017-07-16 DIAGNOSIS — D500 Iron deficiency anemia secondary to blood loss (chronic): Secondary | ICD-10-CM | POA: Diagnosis not present

## 2017-07-16 DIAGNOSIS — Z66 Do not resuscitate: Secondary | ICD-10-CM

## 2017-07-16 DIAGNOSIS — D508 Other iron deficiency anemias: Secondary | ICD-10-CM | POA: Diagnosis not present

## 2017-07-16 DIAGNOSIS — N183 Chronic kidney disease, stage 3 (moderate): Secondary | ICD-10-CM | POA: Diagnosis not present

## 2017-07-16 DIAGNOSIS — D696 Thrombocytopenia, unspecified: Secondary | ICD-10-CM | POA: Diagnosis not present

## 2017-07-16 DIAGNOSIS — K7581 Nonalcoholic steatohepatitis (NASH): Secondary | ICD-10-CM | POA: Diagnosis not present

## 2017-07-16 DIAGNOSIS — K31819 Angiodysplasia of stomach and duodenum without bleeding: Secondary | ICD-10-CM | POA: Diagnosis not present

## 2017-07-16 DIAGNOSIS — E119 Type 2 diabetes mellitus without complications: Secondary | ICD-10-CM

## 2017-07-16 DIAGNOSIS — I5032 Chronic diastolic (congestive) heart failure: Secondary | ICD-10-CM | POA: Diagnosis not present

## 2017-07-16 DIAGNOSIS — J9611 Chronic respiratory failure with hypoxia: Secondary | ICD-10-CM | POA: Diagnosis not present

## 2017-07-16 DIAGNOSIS — E1122 Type 2 diabetes mellitus with diabetic chronic kidney disease: Secondary | ICD-10-CM | POA: Diagnosis not present

## 2017-07-16 DIAGNOSIS — K7460 Unspecified cirrhosis of liver: Secondary | ICD-10-CM | POA: Diagnosis not present

## 2017-07-16 LAB — COMPREHENSIVE METABOLIC PANEL
ALK PHOS: 99 U/L (ref 38–126)
ALT: 20 U/L (ref 14–54)
ANION GAP: 6 (ref 5–15)
AST: 29 U/L (ref 15–41)
Albumin: 2.4 g/dL — ABNORMAL LOW (ref 3.5–5.0)
BILIRUBIN TOTAL: 1.3 mg/dL — AB (ref 0.3–1.2)
BUN: 32 mg/dL — ABNORMAL HIGH (ref 6–20)
CALCIUM: 8.3 mg/dL — AB (ref 8.9–10.3)
CO2: 29 mmol/L (ref 22–32)
CREATININE: 1.2 mg/dL — AB (ref 0.44–1.00)
Chloride: 107 mmol/L (ref 101–111)
GFR calc non Af Amer: 44 mL/min — ABNORMAL LOW (ref >60)
GFR, EST AFRICAN AMERICAN: 51 mL/min — AB (ref >60)
GLUCOSE: 125 mg/dL — AB (ref 65–99)
Potassium: 4 mmol/L (ref 3.5–5.1)
SODIUM: 142 mmol/L (ref 135–145)
TOTAL PROTEIN: 4.8 g/dL — AB (ref 6.5–8.1)

## 2017-07-16 LAB — GLUCOSE, CAPILLARY
GLUCOSE-CAPILLARY: 162 mg/dL — AB (ref 65–99)
Glucose-Capillary: 119 mg/dL — ABNORMAL HIGH (ref 65–99)
Glucose-Capillary: 212 mg/dL — ABNORMAL HIGH (ref 65–99)

## 2017-07-16 MED ORDER — PROMETHAZINE HCL 25 MG PO TABS
12.5000 mg | ORAL_TABLET | Freq: Two times a day (BID) | ORAL | Status: DC | PRN
  Administered 2017-07-16 (×2): 12.5 mg via ORAL
  Filled 2017-07-16 (×2): qty 1

## 2017-07-16 MED ORDER — TRAMADOL HCL 50 MG PO TABS: 50.0000 mg | ORAL_TABLET | Freq: Two times a day (BID) | ORAL | 0 refills | Status: DC | PRN

## 2017-07-16 MED ORDER — ALPRAZOLAM 0.5 MG PO TABS: 0.5000 mg | ORAL_TABLET | Freq: Two times a day (BID) | ORAL | 0 refills | Status: DC | PRN

## 2017-07-16 MED ORDER — OXYCODONE HCL 5 MG PO TABS: 5.0000 mg | ORAL_TABLET | Freq: Four times a day (QID) | ORAL | 0 refills | Status: DC | PRN

## 2017-07-16 NOTE — Progress Notes (Signed)
Pt discharged and will return to [facility] bed 201, report [phone].  Daughter [name] at bedside aware of plan. Pt will need [facility] transport- CSW arranged and completed medical necessity form.   All information provided to facility via the [facility].   Sharren Bridge, MSW, LCSW Clinical Social Work 07/16/2017 [phone]

## 2017-07-16 NOTE — Care Management Obs Status (Signed)
[facility] NOTIFICATION   Patient Details  Name: [name] MRN: [id number] Date of Birth: 12-Jun-1941   Medicare Observation Status Notification Given:  Yes Signed by daughter. Had trouble with signing box.   Lynnell Catalan, RN 07/16/2017, 3:25 PM

## 2017-07-16 NOTE — Discharge Summary (Signed)
Discharge Summary  [name] ERD:[id number] DOB: Oct 23, 1940  PCP: Posey Boyer, MD  Admit date: 07/15/2017 Discharge date: 07/16/2017  Time spent: >46mins, more than 50% time spent on coordination of care, case discussed with [facility], palliative care, case manager and Education officer, museum.   Recommendations for Outpatient Follow-up:  1. F/u with SNF PMD, SNF to check cbc twice a week, arrange outpatient blood product /iron transfusion as needed 2. Palliative care continue to follow up patient at SNF, avoid rehospitalization. Consider transition to hospice when patient is ready.  Discharge Diagnoses:  Active Hospital Problems   Diagnosis Date Noted  . GI bleed 07/15/2017  . Acute blood loss anemia 07/09/2017  . Anemia due to chronic blood loss 05/30/2017  . Diabetes ([place]) 03/01/2016  . Chronic diastolic CHF (congestive heart failure) ([place])   . Chronic hypoxemic respiratory failure ([place]) 02/19/2016  . Liver cirrhosis secondary to NASH ([place])   . DNR (do not resuscitate) 11/13/2015  . Thrombocytopenia ([place]) 08/30/2015  . CKD stage 3 secondary to diabetes ([place]) 11/18/2013  . 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 Angiodysplasia of intestine 06/25/2007    Resolved Hospital Problems  No resolved problems to display.    Discharge Condition: stable  Diet recommendation: heart healthy/carb modified  Filed Weights   07/16/17 0700  Weight: 86.1 kg (189 lb 13.1 oz)    History of present illness:   PCP: Posey Boyer, MD  Patient coming from: SN[facility]  Chief Complaint: Anemia  HPI: [name] is a 76 y.o. female with medical history significant of [place] cirrhosis, history of recurrent upper GI bleeding due to [place] well-known to [facility] gastroenterology, chronic diastolic CHF, COPD with chronic hypoxic respiratory failure, obesity, hyperlipidemia, who is being brought to the hospital from SNF due to anemia.  Patient states that she feels well, may be a  little bit weaker but very close to her baseline.  She denies any abdominal pain, has been complaining of chronic nausea with intermittent vomiting which is unchanged.  Denies any constipation or diarrhea.  She has had all 4 hospitalizations in the last 2 months for symptomatic anemia requiring blood transfusions.  Most recently seen by GI about 1-1/2 weeks ago, and recently had RFA in October 2018.  Currently she denies any chest pain, is complaining of shortness of breath which again is chronic and has not changed recently.  She has no fevers or chills.  ED Course: In the emergency room she is afebrile, heart rate is in the 60s, her blood pressure is within normal limits and she is satting well on 2 L nasal cannula.  Blood work reveals a hemoglobin of 7.0, up from 8.8 when she was discharged just 5 days ago.  She was ordered 2 units of packed red blood cells and we were asked to admit.   Hospital Course:  Active Problems:   Angiodysplasia of intestine   GAVE (gastric antral vascular ectasia)   CKD stage 3 secondary to diabetes ([place])   Thrombocytopenia (HCC)   DNR (do not resuscitate)   Liver cirrhosis secondary to NASH ([place])   Chronic hypoxemic respiratory failure (HCC)   Chronic diastolic CHF (congestive heart failure) (HCC)   Diabetes ([place])   Anemia due to chronic blood loss   Acute blood loss anemia   GI bleed  Upper GI bleed due to GAVE -Chronic problem, but she is relatively asymptomatic, transfused 2 units of packed red blood cells and iv iron -case discussed with [facility]  over the phone. GI confirmed that there is not much to offer as daughter reported on admission. GI agreed with palliative care.   patient does want to have transfusions and medical care. Continue ppi / carafate for now. snf MD to arrange outpatient transfusion to avoid hospitalization, palliative care to follow at SNF, transition to hospice when patient is ready.  [name] cirrhosis with chronic thrombocytopenia -With  prior hepatic encephalopathy, on lactulose, xifaximin.  continue - her platelets seem relatively stable, close to baseline  Insulin dependent Type 2 diabetes mellitus -Resume Lantus at a lower dose than home, provide sliding scale  Chronic kidney disease stage III -Creatinine appears close to baseline, monitor in the morning  Chronic diastolic CHF -Overall she appears euvolemic, continue home meds demadex  COPD with chronic hypoxic respiratory failure -Stable, no wheezing, continue home medications and oxygen supplement   DVT prophylaxis: SCDs  Code Status: DNR  Family Communication: daughter at bedside Disposition Plan: [facility] to [facility] with palliative care to continue following    Procedures:  prbc transfusion  Iron transfusion  Consultations:  GI over the phone  Palliative care, case discussed with Dr Domingo Cocking.   Discharge Exam: BP (!) 147/69 (BP Location: Right Arm)   Pulse (!) 57   Temp 98.6 F (37 C) (Oral)   Resp 16   Ht 5' 2.5" (1.588 m)   Wt 86.1 kg (189 lb 13.1 oz)   SpO2 100%   BMI 34.16 kg/m   General: frail, chronically ill, NAD, alert and oriented Cardiovascular: RRR Respiratory: diminished, no wheezing Abdomen: soft, nontender, + bs  extremities: trace pitting edema bilaterally   Discharge Instructions You were cared for by a hospitalist during your hospital stay. If you have any questions about your discharge medications or the care you received while you were in the hospital after you are discharged, you can call the unit and asked to speak with the hospitalist on call if the hospitalist that took care of you is not available. Once you are discharged, your primary care physician will handle any further medical issues. Please note that NO REFILLS for any discharge medications will be authorized once you are discharged, as it is imperative that you return to your primary care physician (or establish a relationship with a primary care physician if  you do not have one) for your aftercare needs so that they can reassess your need for medications and monitor your lab values.  Discharge Instructions    Diet general   Complete by:  As directed    Diet as tolerated. Carb modified .   Increase activity slowly   Complete by:  As directed      Allergies as of 07/16/2017      Reactions   Acetaminophen Other (See Comments)   Liver dysfunction   Nsaids Other (See Comments)   Liver dysfunction    Aspirin Other (See Comments)   Causes nosebleeds   Theophylline Nausea And Vomiting   Penicillin G Itching   Penicillins Itching, Other (See Comments)   Has patient had a PCN reaction causing immediate rash, facial/tongue/throat swelling, SOB or lightheadedness with hypotension: unknown Has patient had a PCN reaction causing severe rash involving mucus membranes or skin necrosis: unknown Has patient had a PCN reaction that required hospitalization unknown Has patient had a PCN reaction occurring within the last 10 years: yes If all of the above answers are "NO", then may proceed with Cephalosporin use.      Medication List  TAKE these medications   ADVAIR DISKUS 250-50 MCG/DOSE Aepb Generic drug:  Fluticasone-Salmeterol INHALE 1 PUFF INTO LUNGS TWICE A DAY   ALPRAZolam 0.5 MG tablet Commonly known as:  XANAX Take 1 tablet (0.5 mg total) by mouth 2 (two) times daily as needed for anxiety.   atorvastatin 20 MG tablet Commonly known as:  LIPITOR Take 20 mg by mouth daily.   buPROPion 150 MG 24 hr tablet Commonly known as:  WELLBUTRIN XL Take 150 mg by mouth daily.   Darbepoetin Alfa 100 MCG/0.5ML Sosy injection Commonly known as:  ARANESP Inject 100 mcg every 14 (fourteen) days into the skin.   DEPAKENE 250 MG capsule Generic drug:  valproic acid Take 250 mg by mouth 2 (two) times daily.   dicyclomine 20 MG tablet Commonly known as:  BENTYL Take 1 tablet (20 mg total) by mouth 2 (two) times daily.   eucerin cream Apply 1  application daily topically. Apply to nasal septum bilaterally to prevent epistaxis from oxygen-induced drying   iron polysaccharides 150 MG capsule Commonly known as:  NIFEREX Take 1 capsule (150 mg total) 2 (two) times daily by mouth.   isosorbide mononitrate 30 MG 24 hr tablet Commonly known as:  IMDUR Take 1 tablet (30 mg total) by mouth daily.   lactulose 10 GM/15ML solution Commonly known as:  CHRONULAC Take 10 g by mouth 4 (four) times daily.   LANTUS SOLOSTAR 100 UNIT/ML Solostar Pen Generic drug:  Insulin Glargine Inject 60 Units into the skin daily at 10 pm.   levalbuterol 0.63 MG/3ML nebulizer solution Commonly known as:  XOPENEX Take 0.63 mg by nebulization 2 (two) times daily.   multivitamin with minerals Tabs tablet Take 1 tablet by mouth daily.   nitroGLYCERIN 0.4 MG SL tablet Commonly known as:  NITROSTAT Place 1 tablet (0.4 mg total) under the tongue every 5 (five) minutes as needed for chest pain. No more than 3 in 15 minutes   oxyCODONE 5 MG immediate release tablet Commonly known as:  Oxy IR/ROXICODONE Take 1 tablet (5 mg total) by mouth every 6 (six) hours as needed for moderate pain or severe pain.   OXYGEN Inhale 2 L into the lungs continuous.   pantoprazole 40 MG tablet Commonly known as:  PROTONIX TAKE 1 TABLET BY MOUTH TWICE A DAY What changed:  Another medication with the same name was removed. Continue taking this medication, and follow the directions you see here.   potassium chloride SA 20 MEQ tablet Commonly known as:  K-DUR,KLOR-CON Take 20 mEq by mouth daily.   promethazine 12.5 MG tablet Commonly known as:  PHENERGAN Take 12.5 mg by mouth every 12 (twelve) hours.   QUEtiapine 100 MG tablet Commonly known as:  SEROQUEL Take 100 mg by mouth at bedtime.   rifaximin 550 MG Tabs tablet Commonly known as:  XIFAXAN Take 550 mg by mouth 2 (two) times daily.   rOPINIRole 4 MG tablet Commonly known as:  REQUIP Take 4 mg by mouth at  bedtime.   sucralfate 1 GM/10ML suspension Commonly known as:  CARAFATE Take 1 g by mouth every 6 (six) hours as needed.   SYSTANE BALANCE 0.6 % Soln Generic drug:  Propylene Glycol Place 1 drop into both eyes every morning.   torsemide 20 MG tablet Commonly known as:  DEMADEX Take 40 mg by mouth daily.   traMADol 50 MG tablet Commonly known as:  ULTRAM Take 1 tablet (50 mg total) by mouth every 12 (twelve) hours as needed  for moderate pain. Not to be given with Xanax.      Allergies  Allergen Reactions  . Acetaminophen Other (See Comments)    Liver dysfunction  . Nsaids Other (See Comments)    Liver dysfunction   . Aspirin Other (See Comments)    Causes nosebleeds  . Theophylline Nausea And Vomiting  . Penicillin G Itching  . Penicillins Itching and Other (See Comments)    Has patient had a PCN reaction causing immediate rash, facial/tongue/throat swelling, SOB or lightheadedness with hypotension: unknown Has patient had a PCN reaction causing severe rash involving mucus membranes or skin necrosis: unknown Has patient had a PCN reaction that required hospitalization unknown Has patient had a PCN reaction occurring within the last 10 years: yes If all of the above answers are "NO", then may proceed with Cephalosporin use.     Contact information for follow-up providers    SNF to check cbc twice a week,arrange outpatient blood transfusion Follow up.            Contact information for after-discharge care    Destination    [facility] Follow up.   Service:  Skilled Nursing Contact information: [address] [place] [phone]                   The results of significant diagnostics from this hospitalization (including imaging, microbiology, ancillary and laboratory) are listed below for reference.    Significant Diagnostic Studies: Dg Chest 2 View  Result Date: 07/02/2017 CLINICAL DATA:  Fever  with cough EXAM: CHEST  2 VIEW COMPARISON:  06/13/2017 FINDINGS: Cardiomegaly with aortic atherosclerosis. Mild central vascular congestion without pneumonic consolidation. Left perihilar atelectasis and/or scarring. No effusion or pneumothorax. Chronic right seventh and ninth rib fractures. Mild chronic degenerative change along the dorsal spine. No acute osseous abnormality. IMPRESSION: Cardiomegaly with aortic atherosclerosis. Mild pulmonary vascular congestion without acute pneumonic consolidation. Electronically Signed   By: Ashley Royalty M.D.   On: 07/02/2017 21:18    Microbiology: No results found for this or any previous visit (from the past 240 hour(s)).   Labs: Basic Metabolic Panel: Recent Labs  Lab 07/09/17 1849 07/10/17 0704 07/15/17 1733 07/16/17 0658  NA 135 139 140 142  K 3.5 3.9 4.0 4.0  CL 102 107 104 107  CO2 25 28 30 29   GLUCOSE 288* 290* 175* 125*  BUN 22* 25* 33* 32*  CREATININE 1.25* 1.13* 1.38* 1.20*  CALCIUM 8.4* 8.6* 8.2* 8.3*   Liver Function Tests: Recent Labs  Lab 07/09/17 1849 07/15/17 1733 07/16/17 0658  AST 29 31 29   ALT 22 21 20   ALKPHOS 115 99 99  BILITOT 1.0 1.0 1.3*  PROT 4.6* 4.6* 4.8*  ALBUMIN 2.4* 2.4* 2.4*   No results for input(s): LIPASE, AMYLASE in the last 168 hours. No results for input(s): AMMONIA in the last 168 hours. CBC: Recent Labs  Lab 07/09/17 1849 07/10/17 0704 07/15/17 1733  WBC 3.1* 3.0* 2.5*  NEUTROABS 2.3  --   --   HGB 6.9* 8.8* 7.0*  HCT 21.3* 27.1* 21.9*  MCV 98.2 96.1 100.5*  PLT 73* 72* 63*   Cardiac Enzymes: No results for input(s): CKTOTAL, CKMB, CKMBINDEX, TROPONINI in the last 168 hours. BNP: BNP (last 3 results) Recent Labs    06/13/17 1826  BNP 78.8    ProBNP (last 3 results) No results for input(s): PROBNP in the last 8760 hours.  CBG: Recent Labs  Lab  07/10/17 0759 07/10/17 1159 07/15/17 2053 07/16/17 0834 07/16/17 1309  GLUCAP 281* 270* 147* 119* 162*        Signed:  Florencia Reasons MD, PhD  [facility] 07/16/2017, 4:46 PM

## 2017-07-16 NOTE — Progress Notes (Signed)
CSW following as pt is admitted from facility [facility] SNF). Pt/family known to CSW from previous admissions.  Pt returning to SNF at [place], she is long term resident. CSW left voicemail with daughter to confirm her knowledge.  Spoke with facility- they are prepared for pt's return. Facility ordering palliative care to follow as pt "continues to decline overall." Aware that per MD pt "needing frequent outpatient transfusions while she continues to opt for aggressive care." Pt was admitted under observation less than 24 hours therefore no FL2 needed per facility. Will arrange transportation at [place].  Sharren Bridge, MSW, LCSW Clinical Social Work 07/16/2017 [phone]

## 2017-07-17 LAB — TYPE AND SCREEN
ABO/RH(D): O POS
Antibody Screen: NEGATIVE
DONOR AG TYPE: NEGATIVE
DONOR AG TYPE: NEGATIVE
UNIT DIVISION: 0
Unit division: 0

## 2017-07-17 LAB — BPAM RBC
BLOOD PRODUCT EXPIRATION DATE: 201812142359
BLOOD PRODUCT EXPIRATION DATE: 201812172359
ISSUE DATE / TIME: 201811202251
ISSUE DATE / TIME: 201811210158
Unit Type and Rh: 5100
Unit Type and Rh: 5100

## 2017-07-22 ENCOUNTER — Non-Acute Institutional Stay (SKILLED_NURSING_FACILITY): Payer: Medicare Other | Admitting: Internal Medicine

## 2017-07-22 ENCOUNTER — Encounter: Payer: Self-pay | Admitting: Internal Medicine

## 2017-07-22 ENCOUNTER — Other Ambulatory Visit: Payer: Self-pay | Admitting: Internal Medicine

## 2017-07-22 DIAGNOSIS — K31819 Angiodysplasia of stomach and duodenum without bleeding: Secondary | ICD-10-CM | POA: Diagnosis not present

## 2017-07-22 DIAGNOSIS — D500 Iron deficiency anemia secondary to blood loss (chronic): Secondary | ICD-10-CM

## 2017-07-22 LAB — CBC AND DIFFERENTIAL
HEMATOCRIT: 24 — AB (ref 36–46)
HEMOGLOBIN: 7.9 — AB (ref 12.0–16.0)
NEUTROS ABS: 3
Platelets: 70 — AB (ref 150–399)
WBC: 3.6

## 2017-07-22 NOTE — Progress Notes (Signed)
NURSING HOME LOCATION:  [facility] ROOM NUMBER:  [id number]  CODE STATUS:  DNR  PCP:  Hendricks Limes, MD  [address] [place] [address]  This is a nursing facility follow up for [place] readmission within 30 days  Interim medical record and care since last [place] visit was updated with review of diagnostic studies and change in clinical status since last visit were documented.  HPI: The patient was in observation 11/20-11/21/18 for the recurrent, severe anemia in the context of GAVE (gastric antral vascular ectasia) & NASH CIRRHOSIS. Hemoglobin was 7, value had been 8.8 when she was discharged 11/5. 2 units of packed cells and IV iron were administered. Hgb today is at the SNF is 7.9. RFA was completed in October by Dr. Havery Moros , GI, but has not controlled the recurrent bleeding. The ED physician contacted [facility] by phone, GI confirmed that there was not much to offer other than transfusions, IV iron infusions & Aranesp guided by the serial hemoglobins. Definitive treatment would require partial gastric resection of the GAVE, patient is not an operative candidate because of multiple comorbidities and advanced disease. Based on the protocol provided by [facility] she was transfused when the hemoglobin drops below 8. The patient has been hospitalized 4 times in the last 2 months for symptomatic anemia requiring blood transfusions. ED Physician recommended arranging outpatient transfusions through one of the hematologic clinics to prevent repeated rehospitalization. Palliative care was to follow the patient. The patient has told [facility] NP that she wants to continue the present regimen at least until Christmas before deciding on [place].   Patient is now DNR/DNI.  Review of systems:  Earlier when seen by the [facility] NP she was in good spirits and communicative. When I entered the room she did not recognize me even though we have met several times. When I  came back later she was with her daughter. She stated that her daughters "stole my life". She states she had chest pain last night and this has resolved. She also continues to state that sometime in the last 6 weeks she's had vaginal bleeding. She denies any other bleeding dyscrasias. He does state that Dr.Brodie her former gastroenterologist used to arrange blood transfusions as an outpatient. She could not tell me which clinic; apparently this was completed at both hospital facilities.     Physical exam:  Pertinent or positive findings: as noted she is slightly confused. She tends to ramble and confabulate. She has complete dentures. Hirsutism is noted over the chin. She has a 1. 5-2 systolic murmur. Faint musical expiratory rhonchi diffusely. Abdomen is protuberant. Peripheral pulses are decreased. She has 1+ edema. There is  Deformity of the right great toenail. She has contractions of the left toes.   General appearance:Adequately nourished; no acute distress , increased work of breathing is present.   Lymphatic: No lymphadenopathy about the head, neck, axilla . Eyes: No conjunctival inflammation or lid edema is present. There is no scleral icterus. Ears:  External ear exam shows no significant lesions or deformities.   Nose:  External nasal examination shows no deformity or inflammation. Nasal mucosa are pink and moist without lesions ,exudates Oral exam: lips and gums are healthy appearing.There is no oropharyngeal erythema or exudate . Neck:  No thyromegaly, masses, tenderness noted.    Heart:  Normal rate and regular rhythm. S1 and S2 normal without gallop, click, rub .  Lungs: without wheezes,rales , rubs. Abdomen:Bowel sounds are normal.  Abdomen is soft and nontender with no organomegaly, hernias,masses. GU: deferred  Extremities:  No cyanosis, clubbing Skin: Warm & dry w/o tenting. No significant lesions or rash.  See summary under each active problem in the Problem List with  associated updated therapeutic plan     

## 2017-07-22 NOTE — Patient Instructions (Signed)
See assessment and plan under each diagnosis in the problem list and acutely for this visit Total time 28  minutes; greater than 50% of the visit spent counseling patient and daughter and coordinating care for subsequent transfusions in OP clinic through Hematology

## 2017-07-22 NOTE — Assessment & Plan Note (Signed)
07/22/17 I discussed with her & her daughter that the definitive intervention would be partial gastric resection rather than the present protocol addressing the progressive anemia.  I reiterated that she is not a surgical candidate due to her multiple , advancedcomorbidities. They were accepting of this. Again pursue hospice after Christmas as is her wish

## 2017-07-22 NOTE — Assessment & Plan Note (Signed)
Hematology consult to facilitate transfusions in OP clinic as continued transfusions anticipated in context of the GAVE. Transition to Hospice is anticipated after Christmas. The patient has stated "I want to spend one more Christmas with my family"

## 2017-07-23 LAB — CBC AND DIFFERENTIAL
HCT: 25 — AB (ref 36–46)
Hemoglobin: 8.6 — AB (ref 12.0–16.0)
Neutrophils Absolute: 4
PLATELETS: 109 — AB (ref 150–399)
WBC: 6.2

## 2017-07-25 NOTE — Progress Notes (Signed)
06/27/17 

## 2017-07-31 ENCOUNTER — Encounter (HOSPITAL_COMMUNITY): Payer: Self-pay | Admitting: *Deleted

## 2017-07-31 ENCOUNTER — Telehealth: Payer: Self-pay | Admitting: Hematology

## 2017-07-31 ENCOUNTER — Observation Stay (HOSPITAL_COMMUNITY)
Admission: EM | Admit: 2017-07-31 | Discharge: 2017-08-01 | Disposition: A | Discharge status: Home / Self Care | Payer: Medicare Other | Attending: Internal Medicine | Admitting: Internal Medicine

## 2017-07-31 ENCOUNTER — Encounter: Payer: Self-pay | Admitting: Hematology

## 2017-07-31 ENCOUNTER — Other Ambulatory Visit: Payer: Self-pay

## 2017-07-31 DIAGNOSIS — D649 Anemia, unspecified: Principal | ICD-10-CM | POA: Insufficient documentation

## 2017-07-31 DIAGNOSIS — K7460 Unspecified cirrhosis of liver: Secondary | ICD-10-CM | POA: Insufficient documentation

## 2017-07-31 DIAGNOSIS — B372 Candidiasis of skin and nail: Secondary | ICD-10-CM | POA: Insufficient documentation

## 2017-07-31 DIAGNOSIS — G2581 Restless legs syndrome: Secondary | ICD-10-CM | POA: Insufficient documentation

## 2017-07-31 DIAGNOSIS — Z79899 Other long term (current) drug therapy: Secondary | ICD-10-CM | POA: Insufficient documentation

## 2017-07-31 DIAGNOSIS — Z794 Long term (current) use of insulin: Secondary | ICD-10-CM | POA: Insufficient documentation

## 2017-07-31 DIAGNOSIS — N183 Chronic kidney disease, stage 3 (moderate): Secondary | ICD-10-CM | POA: Diagnosis not present

## 2017-07-31 DIAGNOSIS — E1122 Type 2 diabetes mellitus with diabetic chronic kidney disease: Secondary | ICD-10-CM | POA: Diagnosis not present

## 2017-07-31 DIAGNOSIS — D500 Iron deficiency anemia secondary to blood loss (chronic): Secondary | ICD-10-CM | POA: Diagnosis not present

## 2017-07-31 DIAGNOSIS — J449 Chronic obstructive pulmonary disease, unspecified: Secondary | ICD-10-CM | POA: Diagnosis not present

## 2017-07-31 DIAGNOSIS — Z87891 Personal history of nicotine dependence: Secondary | ICD-10-CM | POA: Diagnosis not present

## 2017-07-31 DIAGNOSIS — I5032 Chronic diastolic (congestive) heart failure: Secondary | ICD-10-CM | POA: Insufficient documentation

## 2017-07-31 LAB — BASIC METABOLIC PANEL
Anion gap: 7 (ref 5–15)
BUN: 31 mg/dL — AB (ref 6–20)
CHLORIDE: 107 mmol/L (ref 101–111)
CO2: 26 mmol/L (ref 22–32)
Calcium: 8.3 mg/dL — ABNORMAL LOW (ref 8.9–10.3)
Creatinine, Ser: 1.38 mg/dL — ABNORMAL HIGH (ref 0.44–1.00)
GFR calc Af Amer: 42 mL/min — ABNORMAL LOW (ref >60)
GFR calc non Af Amer: 37 mL/min — ABNORMAL LOW (ref >60)
Glucose, Bld: 256 mg/dL — ABNORMAL HIGH (ref 65–99)
Potassium: 3.9 mmol/L (ref 3.5–5.1)
SODIUM: 140 mmol/L (ref 135–145)

## 2017-07-31 LAB — CBC WITH DIFFERENTIAL/PLATELET
BASOS ABS: 0 10*3/uL (ref 0.0–0.1)
Basophils Relative: 1 %
Eosinophils Absolute: 0.1 10*3/uL (ref 0.0–0.7)
Eosinophils Relative: 4 %
HCT: 21.8 % — ABNORMAL LOW (ref 36.0–46.0)
HEMOGLOBIN: 7.1 g/dL — AB (ref 12.0–15.0)
LYMPHS ABS: 0.4 10*3/uL — AB (ref 0.7–4.0)
Lymphocytes Relative: 13 %
MCH: 33.5 pg (ref 26.0–34.0)
MCHC: 32.6 g/dL (ref 30.0–36.0)
MCV: 102.8 fL — ABNORMAL HIGH (ref 78.0–100.0)
MONO ABS: 0.3 10*3/uL (ref 0.1–1.0)
MONOS PCT: 9 %
NEUTROS ABS: 2.5 10*3/uL (ref 1.7–7.7)
Neutrophils Relative %: 73 %
Platelets: 89 10*3/uL — ABNORMAL LOW (ref 150–400)
RBC: 2.12 MIL/uL — ABNORMAL LOW (ref 3.87–5.11)
RDW: 19.5 % — AB (ref 11.5–15.5)
WBC: 3.3 10*3/uL — ABNORMAL LOW (ref 4.0–10.5)

## 2017-07-31 LAB — PREPARE RBC (CROSSMATCH)

## 2017-07-31 MED ORDER — BUPROPION HCL ER (XL) 150 MG PO TB24: 150.0000 mg | ORAL_TABLET | Freq: Every day | ORAL | Status: DC

## 2017-07-31 MED ORDER — ISOSORBIDE MONONITRATE ER 30 MG PO TB24: 30.0000 mg | ORAL_TABLET | Freq: Every day | ORAL | Status: DC

## 2017-07-31 MED ORDER — NYSTATIN 100000 UNIT/GM EX CREA
TOPICAL_CREAM | Freq: Two times a day (BID) | CUTANEOUS | Status: DC
  Administered 2017-08-01: 1 via TOPICAL
  Filled 2017-07-31: qty 15

## 2017-07-31 MED ORDER — ATORVASTATIN CALCIUM 20 MG PO TABS
20.0000 mg | ORAL_TABLET | Freq: Every day | ORAL | Status: DC
  Administered 2017-08-01 (×2): 20 mg via ORAL
  Filled 2017-07-31 (×2): qty 1

## 2017-07-31 MED ORDER — PANTOPRAZOLE SODIUM 20 MG PO TBEC
20.0000 mg | DELAYED_RELEASE_TABLET | Freq: Every day | ORAL | Status: DC
  Administered 2017-08-01: 20 mg via ORAL
  Filled 2017-07-31: qty 1

## 2017-07-31 MED ORDER — POLYSACCHARIDE IRON COMPLEX 150 MG PO CAPS
150.0000 mg | ORAL_CAPSULE | Freq: Three times a day (TID) | ORAL | Status: DC
  Administered 2017-08-01 (×3): 150 mg via ORAL
  Filled 2017-07-31 (×3): qty 1

## 2017-07-31 MED ORDER — HYDROCERIN EX CREA
1.0000 "application " | TOPICAL_CREAM | Freq: Every day | CUTANEOUS | Status: DC
  Administered 2017-08-01: 1 via TOPICAL
  Filled 2017-07-31: qty 113

## 2017-07-31 MED ORDER — TORSEMIDE 20 MG PO TABS
40.0000 mg | ORAL_TABLET | Freq: Every day | ORAL | Status: DC
  Administered 2017-08-01: 40 mg via ORAL
  Filled 2017-07-31: qty 2

## 2017-07-31 MED ORDER — POLYVINYL ALCOHOL 1.4 % OP SOLN
1.0000 [drp] | Freq: Every morning | OPHTHALMIC | Status: DC
  Filled 2017-07-31: qty 15

## 2017-07-31 MED ORDER — QUETIAPINE FUMARATE 100 MG PO TABS
100.0000 mg | ORAL_TABLET | Freq: Every day | ORAL | Status: DC
  Administered 2017-08-01: 100 mg via ORAL
  Filled 2017-07-31: qty 1

## 2017-07-31 MED ORDER — BUPROPION HCL ER (XL) 150 MG PO TB24
150.0000 mg | ORAL_TABLET | Freq: Every day | ORAL | Status: DC
  Administered 2017-08-01: 150 mg via ORAL
  Filled 2017-07-31: qty 1

## 2017-07-31 MED ORDER — INSULIN GLARGINE 100 UNIT/ML ~~LOC~~ SOLN
50.0000 [IU] | Freq: Every day | SUBCUTANEOUS | Status: DC
  Administered 2017-08-01: 50 [IU] via SUBCUTANEOUS
  Filled 2017-07-31 (×2): qty 0.5

## 2017-07-31 MED ORDER — ALBUTEROL SULFATE (2.5 MG/3ML) 0.083% IN NEBU: 2.5000 mg | INHALATION_SOLUTION | Freq: Four times a day (QID) | RESPIRATORY_TRACT | Status: DC | PRN

## 2017-07-31 MED ORDER — NITROGLYCERIN 0.4 MG SL SUBL: 0.4000 mg | SUBLINGUAL_TABLET | SUBLINGUAL | Status: DC | PRN

## 2017-07-31 MED ORDER — ROPINIROLE HCL 1 MG PO TABS
4.0000 mg | ORAL_TABLET | Freq: Every day | ORAL | Status: DC
  Administered 2017-08-01: 4 mg via ORAL
  Filled 2017-07-31: qty 4

## 2017-07-31 MED ORDER — ONDANSETRON HCL 4 MG PO TABS
4.0000 mg | ORAL_TABLET | Freq: Four times a day (QID) | ORAL | Status: DC | PRN
  Filled 2017-07-31: qty 1

## 2017-07-31 MED ORDER — MOMETASONE FURO-FORMOTEROL FUM 200-5 MCG/ACT IN AERO
2.0000 | INHALATION_SPRAY | Freq: Two times a day (BID) | RESPIRATORY_TRACT | Status: DC
  Filled 2017-07-31 (×2): qty 8.8

## 2017-07-31 MED ORDER — VALPROIC ACID 250 MG PO CAPS
250.0000 mg | ORAL_CAPSULE | Freq: Two times a day (BID) | ORAL | Status: DC
  Administered 2017-08-01 (×2): 250 mg via ORAL
  Filled 2017-07-31 (×3): qty 1

## 2017-07-31 MED ORDER — MELATONIN 3 MG PO TABS: 3.0000 mg | ORAL_TABLET | Freq: Every day | ORAL | Status: DC

## 2017-07-31 MED ORDER — POTASSIUM CHLORIDE CRYS ER 20 MEQ PO TBCR
20.0000 meq | EXTENDED_RELEASE_TABLET | Freq: Every day | ORAL | Status: DC
  Administered 2017-08-01: 20 meq via ORAL
  Filled 2017-07-31: qty 1

## 2017-07-31 MED ORDER — SODIUM CHLORIDE 0.9 % IV SOLN
Freq: Once | INTRAVENOUS | Status: AC
  Administered 2017-08-01: 07:00:00 via INTRAVENOUS

## 2017-07-31 MED ORDER — DICYCLOMINE HCL 20 MG PO TABS
20.0000 mg | ORAL_TABLET | Freq: Two times a day (BID) | ORAL | Status: DC
  Administered 2017-08-01: 20 mg via ORAL
  Filled 2017-07-31 (×2): qty 1

## 2017-07-31 MED ORDER — LACTULOSE 10 GM/15ML PO SOLN
10.0000 g | Freq: Four times a day (QID) | ORAL | Status: DC
  Administered 2017-08-01 (×4): 10 g via ORAL
  Filled 2017-07-31 (×4): qty 15

## 2017-07-31 MED ORDER — SUCRALFATE 1 GM/10ML PO SUSP: 1.0000 g | Freq: Four times a day (QID) | ORAL | Status: DC | PRN

## 2017-07-31 MED ORDER — ONDANSETRON HCL 4 MG/2ML IJ SOLN: 4.0000 mg | Freq: Four times a day (QID) | INTRAMUSCULAR | Status: DC | PRN

## 2017-07-31 MED ORDER — ALPRAZOLAM 0.5 MG PO TABS
0.5000 mg | ORAL_TABLET | Freq: Two times a day (BID) | ORAL | Status: DC | PRN
  Administered 2017-08-01 (×2): 0.5 mg via ORAL
  Filled 2017-07-31 (×2): qty 1

## 2017-07-31 MED ORDER — OXYCODONE HCL 5 MG PO TABS: 5.0000 mg | ORAL_TABLET | Freq: Four times a day (QID) | ORAL | Status: DC | PRN

## 2017-07-31 MED ORDER — ISOSORBIDE MONONITRATE ER 30 MG PO TB24
30.0000 mg | ORAL_TABLET | Freq: Every day | ORAL | Status: DC
  Administered 2017-08-01: 30 mg via ORAL
  Filled 2017-07-31: qty 1

## 2017-07-31 MED ORDER — INSULIN ASPART 100 UNIT/ML ~~LOC~~ SOLN
0.0000 [IU] | Freq: Three times a day (TID) | SUBCUTANEOUS | Status: DC
  Administered 2017-08-01: 1 [IU] via SUBCUTANEOUS
  Administered 2017-08-01 (×2): 2 [IU] via SUBCUTANEOUS

## 2017-07-31 MED ORDER — RIFAXIMIN 550 MG PO TABS
550.0000 mg | ORAL_TABLET | Freq: Two times a day (BID) | ORAL | Status: DC
  Administered 2017-08-01 (×2): 550 mg via ORAL
  Filled 2017-07-31 (×3): qty 1

## 2017-07-31 MED ORDER — TRAMADOL HCL 50 MG PO TABS: 50.0000 mg | ORAL_TABLET | Freq: Two times a day (BID) | ORAL | Status: DC | PRN

## 2017-07-31 NOTE — H&P (Signed)
History and Physical    MELL MELLOTT FBP:[id number] DOB: Jul 13, 1941 DOA: 07/31/2017  Referring MD/NP/PA: Dr. Quintella Reichert PCP: Hendricks Limes, MD  Patient coming from: via EMS  Chief Complaint: need blood transfusion  I have personally briefly reviewed patient's old medical records in [place]   HPI: [name] is a 76 y.o. 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  This is the patient's seventh admission in the last 2 months requiring blood transfusion.  She is being followed by Dr. Havery Moros and is in the process of trying to be set up for outpatient transfusions.  Patient notes that she is continue to have some stools over the last week.  Associated symptoms include nausea, rash (under breasts, vaginal discharge of grayish white substance, intermittent epigastric pain, and generalized fatigue.  Denies having any significant worsening of her shortness of breath, or chest pain.  ED Course: On admission to the emergency department patient was noted to be afebrile vital signs relatively within normal limits.  Labs revealed WBC 3.3, hemoglobin 7.1, platelets 89, BUN 31, creatinine 1.38, and glucose 256.  Patient was ordered to be transfused 2 units of packed red blood cells. TRH called to admit.  Review of Systems  Constitutional: Negative for chills, malaise/fatigue and weight loss.  HENT: Negative for ear discharge and nosebleeds.   Eyes: Negative for photophobia and pain.  Respiratory: Positive for shortness of breath (unchanged).   Cardiovascular: Positive for leg swelling. Negative for chest pain.  Gastrointestinal: Positive for abdominal pain,  melena and nausea. Negative for vomiting.  Genitourinary: Negative for hematuria.       Gray vaginal discharge.  Musculoskeletal: Negative for falls.  Skin: Positive for rash.    Past Medical History:  Diagnosis Date  . Adenomatous colon polyp   . Anxiety   . Asthma   . Back pain   . Benign neoplasm of colon   . CAP (community acquired pneumonia) 2015  . Carpal tunnel syndrome on both sides   . Chronic airway obstruction, not elsewhere classified   . Chronic diastolic CHF (congestive heart failure) ([place])    a. 03/26/2014 Echo: EF 55-60%, no rwma, mild AS/AI, mod-sev Ca2+ MV annulus, mildly to mod dil LA.  [name] Coarse tremors    a. arms.  . Diverticulosis of colon (without mention of hemorrhage)   . Esophageal reflux   . Falls frequently    completed HHPT/OT 06/2014, PT unmet goals  . GAVE (gastric antral vascular ectasia)    angiodysplasia; s/p multiple APCs, discussing RFA with [facility] Dr Newman Pies (06/2015)  . H/O hiatal hernia   . Hepatitis   . Hiatal hernia   . Hyperlipidemia   . Iron deficiency anemia secondary to blood loss (chronic)    a. frequent PRBC transfusions.  . Liver cirrhosis secondary to nonalcoholic steatohepatitis (NASH) ([place])    a. dx'd 1990's  . Major depressive disorder, recurrent episode, severe, specified as with psychotic behavior   . Memory loss   . Midsternal chest pain    a. conservatively managed ->poor candidate for cath/anticoagulation.  . Mild aortic stenosis    a. 03/2014 Valve area (VTI): 2.89 cm^2, Valve area (Vmax): 2.7  cm^2.  . Morbid obesity ([place])   . Neck pain   . Non-obstructive CAD   . On home oxygen therapy    "3L; 24/7" (11/29/2015)  . OSA (obstructive sleep apnea)    a. does not use CPAP. (01/25/2015)  . Osteoarthrosis, unspecified whether generalized or localized, unspecified site   . Osteoporosis, unspecified   . Palpitations   . Portal hypertensive gastropathy ([place])   . Protein calorie malnutrition ([place])   . Recurrent UTI  (urinary tract infection)    h/o hospitalization with urosepsis 2015, but thought large component colonization/bacteriuria, only treat if symptomatic ([place])  . Restless legs syndrome (RLS)   . Schizophrenia ([place])   . Thrombocytopenia ([place])   . Type II diabetes mellitus ([place])   . Unspecified chronic bronchitis ([place])   . Unspecified essential hypertension     Past Surgical History:  Procedure Laterality Date  . APPENDECTOMY    . BALLOON DILATION  06/12/2012   Procedure: BALLOON DILATION;  Surgeon: Lafayette Dragon, MD;  Location: [facility];  Service: Endoscopy;  Laterality: N/A;  ?balloon  . BREAST BIOPSY     bilateral  . CESAREAN SECTION     x 3  . ENTEROSCOPY N/A 02/25/2015   Procedure: ENTEROSCOPY;  Surgeon: [name] Ada, MD;  Location: [facility] ENDOSCOPY;  Service: Endoscopy;  Laterality: N/A;  . ENTEROSCOPY N/A 06/27/2015   Procedure: ENTEROSCOPY;  Surgeon: Manus Gunning, MD;  Location: [facility];  Service: Gastroenterology;  Laterality: N/A;  . ESOPHAGOGASTRODUODENOSCOPY  06/12/2012   Procedure: ESOPHAGOGASTRODUODENOSCOPY (EGD);  Surgeon: Lafayette Dragon, MD;  Location: [facility];  Service: Endoscopy;  Laterality: N/A;  . ESOPHAGOGASTRODUODENOSCOPY  09/10/2012   Procedure: ESOPHAGOGASTRODUODENOSCOPY (EGD);  Surgeon: Lafayette Dragon, MD;  Location: [facility];  Service: Endoscopy;  Laterality: N/A;  . ESOPHAGOGASTRODUODENOSCOPY N/A 01/18/2013   Procedure: ESOPHAGOGASTRODUODENOSCOPY (EGD);  Surgeon: Lafayette Dragon, MD;  Location: [facility] ENDOSCOPY;  Service: Endoscopy;  Laterality: N/A;  . ESOPHAGOGASTRODUODENOSCOPY N/A 05/24/2013   Procedure: ESOPHAGOGASTRODUODENOSCOPY (EGD);  Surgeon: Jerene Bears, MD;  Location: [facility];  Service: Gastroenterology;  Laterality: N/A;  . ESOPHAGOGASTRODUODENOSCOPY N/A 10/20/2014   Procedure: ESOPHAGOGASTRODUODENOSCOPY (EGD);  Surgeon: Lafayette Dragon, MD;  Location: [facility];  Service: Endoscopy;  Laterality: N/A;  . ESOPHAGOGASTRODUODENOSCOPY  N/A 06/18/2017   Procedure: ESOPHAGOGASTRODUODENOSCOPY (EGD) with RFA;  Surgeon: Yetta Flock, MD;  Location: [facility];  Service: Gastroenterology;  Laterality: N/A;  . EUS  09/2015   antral CAVL, ablated; nodular erosive gastritis; mult pancreatic cysts rec rpt CT pancreas protocol or MRI to survey pancreas cysts 1-2 yrs (Dr Amie Critchley)  . HEMORROIDECTOMY    . HOT HEMOSTASIS  09/10/2012   Procedure: HOT HEMOSTASIS (ARGON PLASMA COAGULATION/BICAP);  Surgeon: Lafayette Dragon, MD;  Location: [facility];  Service: Endoscopy;  Laterality: N/A;  . HOT HEMOSTASIS N/A 05/24/2013   Procedure: HOT HEMOSTASIS (ARGON PLASMA COAGULATION/BICAP);  Surgeon: Jerene Bears, MD;  Location: [facility];  Service: Gastroenterology;  Laterality: N/A;  . HOT HEMOSTASIS N/A 10/20/2014   Procedure: HOT HEMOSTASIS (ARGON PLASMA COAGULATION/BICAP);  Surgeon: Lafayette Dragon, MD;  Location: [facility];  Service: Endoscopy;  Laterality: N/A;  . US ECHOCARDIOGRAPHY  07/2014   mild LVH, EF 60-65%, normal wall motion, mild AR, mod dilated LA, mildly dilated RA, peak PA pressure 7mmHg  . VAGINAL HYSTERECTOMY       reports that she quit smoking about 24 years ago. Her smoking use included cigarettes. She has a 37.50 pack-year smoking history. she has  never used smokeless tobacco. She reports that she does not drink alcohol or use drugs.  Allergies  Allergen Reactions  . Acetaminophen Other (See Comments)    Liver dysfunction  . Nsaids Other (See Comments)    Liver dysfunction   . Aspirin Other (See Comments)    Causes nosebleeds  . Theophylline Nausea And Vomiting  . Penicillin G Itching  . Penicillins Itching and Other (See Comments)    Has patient had a PCN reaction causing immediate rash, facial/tongue/throat swelling, SOB or lightheadedness with hypotension: unknown Has patient had a PCN reaction causing severe rash involving mucus membranes or skin necrosis: unknown Has patient had a PCN reaction that  required hospitalization unknown Has patient had a PCN reaction occurring within the last 10 years: yes If all of the above answers are "NO", then may proceed with Cephalosporin use.     Family History  Problem Relation Age of Onset  . Heart disease Mother   . Cervical cancer Mother   . Kidney disease Mother   . Diabetes Mother   . Heart attack Mother   . Diabetes Father   . Heart attack Father   . Multiple sclerosis Other   . Breast cancer Sister   . Multiple sclerosis Sister   . Breast cancer Maternal Aunt        x 2  . Diabetes Sister   . Stroke Daughter   . Stroke Daughter   . Lupus Daughter   . Colon cancer Neg Hx     Prior to Admission medications   Medication Sig Start Date End Date Taking? Authorizing Provider  ADVAIR DISKUS 250-50 MCG/DOSE AEPB INHALE 1 PUFF INTO LUNGS TWICE A DAY 05/29/15   Ria Bush, MD  albuterol (PROVENTIL) (2.5 MG/3ML) 0.083% nebulizer solution Take 2.5 mg by nebulization every 6 (six) hours as needed for wheezing or shortness of breath.    [provider]  ALPRAZolam Duanne Moron) 0.5 MG tablet Take 1 tablet (0.5 mg total) by mouth 2 (two) times daily as needed for anxiety. 07/16/17   Florencia Reasons, MD  atorvastatin (LIPITOR) 20 MG tablet Take 20 mg by mouth daily.     [provider]  buPROPion (WELLBUTRIN XL) 150 MG 24 hr tablet Take 150 mg by mouth daily.    [provider]  Cholecalciferol 1000 units tablet Take 1,000 Units by mouth daily.    [provider]  Darbepoetin Alfa (ARANESP) 100 MCG/0.5ML SOSY injection Inject 100 mcg every 14 (fourteen) days into the skin.    [provider]  dicyclomine (BENTYL) 20 MG tablet Take 1 tablet (20 mg total) by mouth 2 (two) times daily. 04/07/16   Doristine Devoid, PA-C  Insulin Glargine (LANTUS SOLOSTAR) 100 UNIT/ML Solostar Pen Inject 60 Units into the skin daily at 10 pm.     [provider]  insulin lispro (HUMALOG KWIKPEN) 100 UNIT/ML KiwkPen  Inject 3 Units into the skin daily. Before breakfast    [provider]  iron polysaccharides (NIFEREX) 150 MG capsule Take 150 mg by mouth 3 (three) times daily with meals.    [provider]  isosorbide mononitrate (IMDUR) 30 MG 24 hr tablet Take 1 tablet (30 mg total) by mouth daily. 04/01/16   Richardson Dopp T, PA-C  lactulose (CHRONULAC) 10 GM/15ML solution Take 10 g by mouth 4 (four) times daily.    [provider]  levalbuterol Penne Lash) 0.63 MG/3ML nebulizer solution Take 0.63 mg by nebulization 2 (two) times daily.  06/05/14  [provider]  Multiple Vitamin (MULTIVITAMIN WITH MINERALS) TABS tablet Take 1 tablet by mouth daily.     [provider]  nitroGLYCERIN (NITROSTAT) 0.4 MG SL tablet Place 1 tablet (0.4 mg total) under the tongue every 5 (five) minutes as needed for chest pain. No more than 3 in 15 minutes 02/16/16   Sherren Mocha, MD  oxyCODONE (OXY IR/ROXICODONE) 5 MG immediate release tablet Take 1 tablet (5 mg total) by mouth every 6 (six) hours as needed for moderate pain or severe pain. 07/16/17   Florencia Reasons, MD  OXYGEN Inhale 2 L into the lungs continuous.     [provider]  pantoprazole (PROTONIX) 20 MG tablet Take 20 mg by mouth 2 (two) times daily.    [provider]  potassium chloride SA (K-DUR,KLOR-CON) 20 MEQ tablet Take 20 mEq by mouth daily.    [provider]  promethazine (PHENERGAN) 12.5 MG tablet Take 12.5 mg by mouth every 12 (twelve) hours.    [provider]  Propylene Glycol (SYSTANE BALANCE) 0.6 % SOLN Place 1 drop into both eyes every morning.    [provider]  QUEtiapine (SEROQUEL) 100 MG tablet Take 100 mg by mouth at bedtime.    [provider]  rifaximin (XIFAXAN) 550 MG TABS tablet Take 550 mg by mouth 2 (two) times daily.    [provider]  rOPINIRole (REQUIP) 4 MG tablet Take 4 mg by mouth at bedtime.    [provider]  Skin  Protectants, Misc. (EUCERIN) cream Apply 1 application daily topically. Apply to nasal septum bilaterally to prevent epistaxis from oxygen-induced drying     [provider]  sucralfate (CARAFATE) 1 GM/10ML suspension Take 1 g by mouth every 6 (six) hours as needed.    [provider]  torsemide (DEMADEX) 20 MG tablet Take 40 mg by mouth daily.    [provider]  traMADol (ULTRAM) 50 MG tablet Take 1 tablet (50 mg total) by mouth every 12 (twelve) hours as needed for moderate pain. Not to be given with Xanax. 07/16/17   Florencia Reasons, MD  valproic acid (DEPAKENE) 250 MG capsule Take 250 mg by mouth 2 (two) times daily.    [provider]    Physical Exam:  Constitutional: Female who appears to be in NAD. Vitals:   07/31/17 1834 07/31/17 1928  BP:  135/61  Pulse:  66  Resp:  18  Temp:  98.6 F (37 C)  TempSrc:  Oral  SpO2:  99%  Weight: 85.7 kg (189 lb)   Height: 5\' 2"  (1.575 m)    Eyes: PERRL, lids and conjunctivae normal ENMT: Mucous membranes are moist. Posterior pharynx clear of any exudate or lesions. Neck: normal, supple, no masses, no thyromegaly Respiratory: clear to auscultation bilaterally, no wheezing, no crackles. Normal respiratory effort. No accessory muscle use.  On 2 L of nasal cannula oxygen Cardiovascular: Regular rate and rhythm, systolic murmurs present.  No rubs / gallops.  Trace lower extremity edema. 2+ pedal pulses. No carotid bruits.  Abdomen: no tenderness, no masses palpated. No hepatosplenomegaly. Bowel sounds positive.  Musculoskeletal: no clubbing / cyanosis. No joint deformity upper and lower extremities. Good ROM, no contractures. Normal muscle tone.  Skin: Pallor, fungal appearing rash of the navel and underside of breast. Neurologic: CN 2-12 grossly intact. Sensation intact, DTR normal. Strength 5/5 in all 4.  Psychiatric: Normal judgment and insight. Alert and oriented x 3. Normal mood.     Labs  on Admission: I have  personally reviewed following labs and imaging studies  CBC: Recent Labs  Lab 07/31/17 1709  WBC 3.3*  NEUTROABS 2.5  HGB 7.1*  HCT 21.8*  MCV 102.8*  PLT 89*   Basic Metabolic Panel: Recent Labs  Lab 07/31/17 1709  NA 140  K 3.9  CL 107  CO2 26  GLUCOSE 256*  BUN 31*  CREATININE 1.38*  CALCIUM 8.3*   GFR: Estimated Creatinine Clearance: 36.3 mL/min (A) (by C-G formula based on SCr of 1.38 mg/dL (H)). Liver Function Tests: No results for input(s): AST, ALT, ALKPHOS, BILITOT, PROT, ALBUMIN in the last 168 hours. No results for input(s): LIPASE, AMYLASE in the last 168 hours. No results for input(s): AMMONIA in the last 168 hours. Coagulation Profile: No results for input(s): INR, PROTIME in the last 168 hours. Cardiac Enzymes: No results for input(s): CKTOTAL, CKMB, CKMBINDEX, TROPONINI in the last 168 hours. BNP (last 3 results) No results for input(s): PROBNP in the last 8760 hours. HbA1C: No results for input(s): HGBA1C in the last 72 hours. CBG: No results for input(s): GLUCAP in the last 168 hours. Lipid Profile: No results for input(s): CHOL, HDL, LDLCALC, TRIG, CHOLHDL, LDLDIRECT in the last 72 hours. Thyroid Function Tests: No results for input(s): TSH, T4TOTAL, FREET4, T3FREE, THYROIDAB in the last 72 hours. Anemia Panel: No results for input(s): VITAMINB12, FOLATE, FERRITIN, TIBC, IRON, RETICCTPCT in the last 72 hours. Urine analysis:    Component Value Date/Time   COLORURINE YELLOW 07/02/2017 2140   APPEARANCEUR CLEAR 07/02/2017 2140   LABSPEC 1.010 07/02/2017 2140   PHURINE 6.0 07/02/2017 2140   GLUCOSEU NEGATIVE 07/02/2017 2140   GLUCOSEU NEGATIVE 07/17/2015 0907   HGBUR NEGATIVE 07/02/2017 2140   BILIRUBINUR NEGATIVE 07/02/2017 2140   BILIRUBINUR negative 02/26/2016 1206   KETONESUR NEGATIVE 07/02/2017 2140   PROTEINUR NEGATIVE 07/02/2017 2140   UROBILINOGEN 4.0 02/26/2016 1206   UROBILINOGEN 0.2 07/17/2015 0907   NITRITE NEGATIVE  07/02/2017 2140   LEUKOCYTESUR SMALL (A) 07/02/2017 2140   Sepsis Labs: No results found for this or any previous visit (from the past 240 hour(s)).   Radiological Exams on Admission: No results found.    Assessment/Plan Symptomatic anemia 2/2 GAVE(Gastric antral vascular ectasia) and chronic GI bleed:Acute on chronic.  Hemoglobin 7.7 at nursing facility dropped to 7.1 on admission. Followed by Havery Moros of [facility] currently in the process of being set up for outpatient blood transfusion. - Admit to MedSurg bed - Continue transfusion of 2 units of blood - Recheck CBC in a.m. - Notify Dr. Havery Moros in a.m. patient is in the hospital  Candidiasis of skin: Acute.  Patient with yeast infection present of the underside of the breast and navel. - Nystatin cream  Chronic diastolic CHF: At this time patient appears to be euvolemic.  Last EF noted to be 60-65% with grade 2 diastolic dysfunction and 01/2693. - Continue to monitor fluid status - Continue torosemide  Possible yeast infection: Acute. - Check urinalysis - Will consider giving Diflucan IV 200 mg x1 dose  NASH cirrhosis - Continue lactulose, rifaximin  COPD with chronic respiratory failure: Patient on 2 L nasal cannula oxygen at home. - Continuous pulse oximetry overnight with 2 L of nasal cannula oxygen - Continue home medications  Diabetes mellitus type 2 - Hypoglycemic protocols - Continue Lantus, but decrease dosage from 60 units to 50 units nightly - CBGs q. before meals with moderate sliding scale insulin   Chronic kidney disease stage III: stable.  Creatinine noted to be 1.38 BUN 31 on admission.  Baseline creatinine appears to range from 1.1-1.4. - Continue to monitor   Thrombocytopenia: Chronic.  Secondary to history of cirrhosis. - Continue to monitor  DVT prophylaxis: SCDs  Code Status: DNR Family Communication:  Disposition Plan: Likely discharge back to nursing facility in a.m. Consults called:  None  Admission status: observation  Norval Morton MD [facility] Pager [phone]   If 7PM-7AM, please contact night-coverage [email or web address] Password [email or web address]  07/31/2017, 9:06 PM

## 2017-07-31 NOTE — Telephone Encounter (Signed)
Appt has been scheduled for the pt to see Dr. Irene Limbo on 12/17 at 11:20am. Referring office aware the pt should arrive 20 minutes early for check-in.

## 2017-07-31 NOTE — ED Notes (Signed)
Assigned room 1327 @2213 

## 2017-07-31 NOTE — Clinical Social Work Note (Signed)
Clinical Social Work Assessment  Patient Details  Name: [name] MRN: [id number] Date of Birth: 07/29/1941  Date of referral:  07/31/17               Reason for consult:  Facility Placement                Permission sought to share information with:  Facility Art therapist granted to share information::  Yes, Verbal Permission Granted  Name::        Agency::     Relationship::     Contact Information:     Housing/Transportation Living arrangements for the past 2 months:  [place] of Information:  Patient, Adult Children Patient Interpreter Needed:  None Criminal Activity/Legal Involvement Pertinent to Current Situation/Hospitalization:    Significant Relationships:  Adult Children Lives with:  Facility Resident Do you feel safe going back to the place where you live?  Yes Need for family participation in patient care:  Yes (Comment)  Care giving concerns:  No care giving concerns expressed at this time.    Social Worker assessment / plan:  CSW consulted due to patient arriving to ED from a facility. CSW met with pt and pt's adult daughter via bedside. Both pt and pt's adult daughter stated pt wants to return to [facility] once medically cleared from the hospital. Pt is a long term resident at [facility]. Pt has been living there for the past 3 months. Pt's daughter explained that pt will no longer have to come to the hospital for blood transfusions once specialist appointments begin in mid December.   Employment status:    Insurance informationEducational psychologist PT Recommendations:  Not assessed at this time Information / Referral to community resources:     Patient/Family's Response to care:  Pt and pt's daughter agreeable to plan of care.   Patient/Family's Understanding of and Emotional Response to Diagnosis, Current Treatment, and Prognosis:  No concerns or questions posed to the CSW at this time.   Emotional  Assessment Appearance:  Appears stated age Attitude/Demeanor/Rapport:    Affect (typically observed):  Accepting, Appropriate, Calm Orientation:  Oriented to Self, Oriented to Situation, Oriented to Place, Oriented to  Time Alcohol / Substance use:    Psych involvement (Current and /or in the community):  No (Comment)  Discharge Needs  Concerns to be addressed:  No discharge needs identified Readmission within the last 30 days:  Yes Current discharge risk:  None Barriers to Discharge:  No Barriers Identified   Wendelyn Breslow, LCSW 07/31/2017, 10:26 PM

## 2017-07-31 NOTE — ED Notes (Signed)
Pt questioned a second IV. Talked with MD and she stated she was fine if the pt did not want another IV to just have 1 during blood administration.

## 2017-07-31 NOTE — ED Notes (Signed)
Pt has no complaints at this time. She is here for a transfusion at the recommendation of her PA. Reports Hgb is 7.9.
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
## 2017-07-31 NOTE — ED Notes (Signed)
Per NP at [facility], patient's Hgb is 8.7-states she needs to be transfused-has an appointment with outpatient clinic next week

## 2017-07-31 NOTE — ED Notes (Signed)
ED TO INPATIENT HANDOFF REPORT  Name/Age/Gender [name] 76 y.o. female  Code Status    Code Status Orders  (From admission, onward)        Start     Ordered   07/31/17 2156  Do not attempt resuscitation (DNR)  Continuous    Question Answer Comment  In the event of cardiac or respiratory ARREST Do not call a "code blue"   In the event of cardiac or respiratory ARREST Do not perform Intubation, CPR, defibrillation or ACLS   In the event of cardiac or respiratory ARREST Use medication by any route, position, wound care, and other measures to relive pain and suffering. May use oxygen, suction and manual treatment of airway obstruction as needed for comfort.      07/31/17 2157    Code Status History    Date Active Date Inactive Code Status Order ID Comments User Context   07/15/2017 20:26 07/16/2017 21:34 DNR [id number]  Caren Griffins, MD ED   07/09/2017 22:21 07/10/2017 19:55 DNR [id number]  Rise Patience, MD ED   06/26/2017 21:15 06/27/2017 22:28 DNR [id number]  Roney Jaffe, MD Inpatient   06/26/2017 19:52 06/26/2017 21:15 DNR [id number]  Roney Jaffe, MD ED   06/14/2017 02:23 06/15/2017 18:49 DNR [id number]  Toy Baker, MD ED   05/30/2017 22:28 05/31/2017 18:29 DNR [id number]  Etta Quill, DO ED   05/15/2017 20:39 05/17/2017 16:51 DNR [id number]  Caren Griffins, MD Inpatient   03/01/2016 13:12 03/03/2016 14:13 DNR [id number]  Radene Gunning, NP ED   11/28/2015 11:39 11/30/2015 16:49 DNR [id number]  Willia Craze, NP ED   11/01/2015 21:57 11/06/2015 16:13 Partial Code [id number]  Ivor Costa, MD Inpatient   11/01/2015 21:23 11/01/2015 21:56 Full Code [id number]  Ivor Costa, MD ED   08/28/2015 14:15 09/01/2015 17:17 DNR [id number]  Radene Gunning, NP Inpatient   02/23/2015 20:50 02/28/2015 19:41 Partial Code [id number]  Ivor Costa, MD ED   02/16/2015 18:06 02/23/2015 20:50 DNR [id number]  Hennie Duos, MD Outpatient   01/25/2015 17:31 01/28/2015 21:01 Full Code [id number]  Karen Kitchens Inpatient   08/11/2014 23:31 08/16/2014 15:16 Full Code [id number]  Rise Patience, MD Inpatient   03/26/2014 13:21 03/31/2014 19:49 Partial Code [id number]  Bonnielee Haff, MD Inpatient   03/26/2014 01:46 03/26/2014 13:21 Full Code [id number]  Hosie Poisson, MD ED   10/03/2013 04:35 10/04/2013 16:21 DNR [id number]  Bynum Bellows, MD Inpatient   05/23/2013 00:23 05/26/2013 22:07 Full Code [id number]  Theressa Millard, MD ED   02/16/2013 21:16 02/19/2013 21:04 Full Code [id number]  Etta Quill., DO ED   02/01/2013 23:37 02/04/2013 14:03 Full Code [id number]  Rise Patience, MD Inpatient   01/18/2013 03:46 01/19/2013 17:57 Full Code [id number]  Orvan Falconer, MD Inpatient   11/22/2011 22:40 11/26/2011 13:21 Full Code [id number]  Andreas Blower, RN Inpatient    Advance Directive Documentation     Most Recent Value  Type of Advance Directive  Out of facility DNR (pink MOST or yellow form)  Pre-existing out of facility DNR order (yellow form or pink MOST form)  No data  "MOST" Form in Place?  No data      Home/SNF/Other Nursing Home  Chief Complaint transfusion  Level of Care/Admitting Diagnosis ED Disposition    ED Disposition Condition Comment   Admit  Hospital Area: [facility] [[id number]]  Level of Care: Med-Surg [16]  Diagnosis: Symptomatic anemia [7793903]  Admitting Physician:  Norval Morton [[id number]]  Attending Physician: Norval Morton [[id number]]  PT Class (Do Not Modify): Observation [104]  PT Acc Code (Do Not Modify): Observation [10022]       Medical History Past Medical History:  Diagnosis Date  . Adenomatous colon polyp   . Anxiety   . Asthma   . Back pain   . Benign neoplasm of colon   . CAP (community acquired pneumonia) 2015  . Carpal tunnel syndrome on both sides   . Chronic airway obstruction, not elsewhere classified   . Chronic diastolic CHF (congestive heart failure) ([place])    a. 03/26/2014 Echo: EF 55-60%, no rwma, mild  AS/AI, mod-sev Ca2+ MV annulus, mildly to mod dil LA.  [name] Coarse tremors    a. arms.  . Diverticulosis of colon (without mention of hemorrhage)   . Esophageal reflux   . Falls frequently    completed HHPT/OT 06/2014, PT unmet goals  . GAVE (gastric antral vascular ectasia)    angiodysplasia; s/p multiple APCs, discussing RFA with [facility] Dr Newman Pies (06/2015)  . H/O hiatal hernia   . Hepatitis   . Hiatal hernia   . Hyperlipidemia   . Iron deficiency anemia secondary to blood loss (chronic)    a. frequent PRBC transfusions.  . Liver cirrhosis secondary to nonalcoholic steatohepatitis (NASH) ([place])    a. dx'd 1990's  . Major depressive disorder, recurrent episode, severe, specified as with psychotic behavior   . Memory loss   . Midsternal chest pain    a. conservatively managed ->poor candidate for cath/anticoagulation.  . Mild aortic stenosis    a. 03/2014 Valve area (VTI): 2.89 cm^2, Valve area (Vmax): 2.7 cm^2.  . Morbid obesity ([place])   . Neck pain   . Non-obstructive CAD   . On home oxygen therapy    "3L; 24/7" (11/29/2015)  . OSA (obstructive sleep apnea)    a. does not use CPAP. (01/25/2015)  . Osteoarthrosis, unspecified whether generalized or localized, unspecified site   . Osteoporosis, unspecified   . Palpitations   . Portal hypertensive gastropathy ([place])   . Protein calorie malnutrition ([place])   . Recurrent UTI (urinary tract infection)    h/o hospitalization with urosepsis 2015, but thought large component colonization/bacteriuria, only treat if symptomatic ([place])  . Restless legs syndrome (RLS)   . Schizophrenia ([place])   . Thrombocytopenia ([place])   . Type II diabetes mellitus ([place])   . Unspecified chronic bronchitis ([place])   . Unspecified essential hypertension     Allergies Allergies  Allergen Reactions  . Acetaminophen Other (See Comments)    Liver dysfunction  . Nsaids Other (See Comments)    Liver dysfunction   . Aspirin Other (See Comments)    Causes  nosebleeds  . Theophylline Nausea And Vomiting  . Penicillin G Itching  . Penicillins Itching and Other (See Comments)    Has patient had a PCN reaction causing immediate rash, facial/tongue/throat swelling, SOB or lightheadedness with hypotension: unknown Has patient had a PCN reaction causing severe rash involving mucus membranes or skin necrosis: unknown Has patient had a PCN reaction that required hospitalization unknown Has patient had a PCN reaction occurring within the last 10 years: yes If all of the above answers are "NO", then may proceed with Cephalosporin use.     IV Location/Drains/Wounds Patient Lines/Drains/Airways Status   Active Line/Drains/Airways    Name:   Placement date:   Placement time:   Site:   Days:   Peripheral IV  07/31/17 Right Forearm   07/31/17    1945    Forearm   less than 1          Labs/Imaging Results for orders placed or performed during the hospital encounter of 07/31/17 (from the past 48 hour(s))  CBC with Differential     Status: Abnormal   Collection Time: 07/31/17  5:09 PM  Result Value Ref Range   WBC 3.3 (L) 4.0 - 10.5 K/uL   RBC 2.12 (L) 3.87 - 5.11 MIL/uL   Hemoglobin 7.1 (L) 12.0 - 15.0 g/dL   HCT 21.8 (L) 36.0 - 46.0 %   MCV 102.8 (H) 78.0 - 100.0 fL   MCH 33.5 26.0 - 34.0 pg   MCHC 32.6 30.0 - 36.0 g/dL   RDW 19.5 (H) 11.5 - 15.5 %   Platelets 89 (L) 150 - 400 K/uL    Comment: REPEATED TO VERIFY SPECIMEN CHECKED FOR CLOTS PLATELET COUNT CONFIRMED BY SMEAR    Neutrophils Relative % 73 %   Lymphocytes Relative 13 %   Monocytes Relative 9 %   Eosinophils Relative 4 %   Basophils Relative 1 %   Neutro Abs 2.5 1.7 - 7.7 K/uL   Lymphs Abs 0.4 (L) 0.7 - 4.0 K/uL   Monocytes Absolute 0.3 0.1 - 1.0 K/uL   Eosinophils Absolute 0.1 0.0 - 0.7 K/uL   Basophils Absolute 0.0 0.0 - 0.1 K/uL   RBC Morphology POLYCHROMASIA PRESENT    Smear Review PLATELET COUNT CONFIRMED BY SMEAR   Basic metabolic panel     Status: Abnormal    Collection Time: 07/31/17  5:09 PM  Result Value Ref Range   Sodium 140 135 - 145 mmol/L   Potassium 3.9 3.5 - 5.1 mmol/L   Chloride 107 101 - 111 mmol/L   CO2 26 22 - 32 mmol/L   Glucose, Bld 256 (H) 65 - 99 mg/dL   BUN 31 (H) 6 - 20 mg/dL   Creatinine, Ser 1.38 (H) 0.44 - 1.00 mg/dL   Calcium 8.3 (L) 8.9 - 10.3 mg/dL   GFR calc non Af Amer 37 (L) >60 mL/min   GFR calc Af Amer 42 (L) >60 mL/min    Comment: (NOTE) The eGFR has been calculated using the CKD EPI equation. This calculation has not been validated in all clinical situations. eGFR's persistently <60 mL/min signify possible Chronic Kidney Disease.    Anion gap 7 5 - 15  Type and screen [place]     Status: None   Collection Time: 07/31/17  7:14 PM  Result Value Ref Range   ABO/RH(D) O POS    Antibody Screen NEG    Sample Expiration 08/03/2017   Prepare RBC     Status: None   Collection Time: 07/31/17  9:00 PM  Result Value Ref Range   Order Confirmation ORDER PROCESSED BY BLOOD BANK    *Note: Due to a large number of results and/or encounters for the requested time period, some results have not been displayed. A complete set of results can be found in Results Review.   No results found.  Pending Labs Unresulted Labs (From admission, onward)   Start     Ordered   08/01/17 0500  CBC  Tomorrow morning,   R     07/31/17 2157   08/01/17 0500  Comprehensive metabolic panel  Tomorrow morning,   R     07/31/17 2157      Vitals/Pain Today's Vitals   07/31/17 1834 07/31/17  1928 07/31/17 2113  BP:  135/61 (!) 157/52  Pulse:  66 63  Resp:  18 18  Temp:  98.6 F (37 C)   TempSrc:  Oral   SpO2:  99% 100%  Weight: 189 lb (85.7 kg)    Height: _0  (1.575 m)      Isolation Precautions No active isolations  Medications Medications  0.9 %  sodium chloride infusion (not administered)  mometasone-formoterol (DULERA) 200-5 MCG/ACT inhaler 2 puff (not administered)  atorvastatin (LIPITOR)  tablet 20 mg (not administered)  ALPRAZolam (XANAX) tablet 0.5 mg (not administered)  albuterol (PROVENTIL) (2.5 MG/3ML) 0.083% nebulizer solution 2.5 mg (not administered)  insulin glargine (LANTUS) injection 50 Units (not administered)  dicyclomine (BENTYL) tablet 20 mg (not administered)  iron polysaccharides (NIFEREX) capsule 150 mg (not administered)  lactulose (CHRONULAC) 10 GM/15ML solution 10 g (not administered)  nitroGLYCERIN (NITROSTAT) SL tablet 0.4 mg (not administered)  oxyCODONE (Oxy IR/ROXICODONE) immediate release tablet 5 mg (not administered)  rifaximin (XIFAXAN) tablet 550 mg (not administered)  ondansetron (ZOFRAN) tablet 4 mg (not administered)    Or  ondansetron (ZOFRAN) injection 4 mg (not administered)  insulin aspart (novoLOG) injection 0-9 Units (not administered)  buPROPion (WELLBUTRIN XL) 24 hr tablet 150 mg (not administered)  isosorbide mononitrate (IMDUR) 24 hr tablet 30 mg (not administered)    Mobility manual wheelchair

## 2017-07-31 NOTE — ED Notes (Signed)
Blood bank called and stated that the pt is an antibody pt and the blood will need to be ordered.

## 2017-07-31 NOTE — ED Notes (Signed)
MD notified of delay for blood. MD stated pt is stable enough to wait until tomorrow for blood. Family notified.

## 2017-07-31 NOTE — ED Notes (Signed)
Bed: AU45 Expected date:  Expected time:  Means of arrival:  Comments: EMS-transfusion

## 2017-08-01 DIAGNOSIS — D649 Anemia, unspecified: Secondary | ICD-10-CM | POA: Diagnosis not present

## 2017-08-01 DIAGNOSIS — B372 Candidiasis of skin and nail: Secondary | ICD-10-CM | POA: Diagnosis present

## 2017-08-01 LAB — URINALYSIS, ROUTINE W REFLEX MICROSCOPIC
Bilirubin Urine: NEGATIVE
Glucose, UA: NEGATIVE mg/dL
HGB URINE DIPSTICK: NEGATIVE
KETONES UR: NEGATIVE mg/dL
LEUKOCYTES UA: NEGATIVE
Nitrite: NEGATIVE
PROTEIN: NEGATIVE mg/dL
Specific Gravity, Urine: 1.014 (ref 1.005–1.030)
pH: 5 (ref 5.0–8.0)

## 2017-08-01 LAB — COMPREHENSIVE METABOLIC PANEL
ALK PHOS: 89 U/L (ref 38–126)
ALT: 23 U/L (ref 14–54)
ANION GAP: 6 (ref 5–15)
AST: 33 U/L (ref 15–41)
Albumin: 2.4 g/dL — ABNORMAL LOW (ref 3.5–5.0)
BILIRUBIN TOTAL: 1.2 mg/dL (ref 0.3–1.2)
BUN: 29 mg/dL — AB (ref 6–20)
CALCIUM: 8.1 mg/dL — AB (ref 8.9–10.3)
CO2: 27 mmol/L (ref 22–32)
Chloride: 108 mmol/L (ref 101–111)
Creatinine, Ser: 1.27 mg/dL — ABNORMAL HIGH (ref 0.44–1.00)
GFR calc Af Amer: 47 mL/min — ABNORMAL LOW (ref >60)
GFR, EST NON AFRICAN AMERICAN: 41 mL/min — AB (ref >60)
Glucose, Bld: 194 mg/dL — ABNORMAL HIGH (ref 65–99)
POTASSIUM: 4.2 mmol/L (ref 3.5–5.1)
Sodium: 141 mmol/L (ref 135–145)
TOTAL PROTEIN: 4.6 g/dL — AB (ref 6.5–8.1)

## 2017-08-01 LAB — CBC
HEMATOCRIT: 20.7 % — AB (ref 36.0–46.0)
HEMATOCRIT: 31 % — AB (ref 36.0–46.0)
HEMOGLOBIN: 10.1 g/dL — AB (ref 12.0–15.0)
Hemoglobin: 6.6 g/dL — CL (ref 12.0–15.0)
MCH: 33 pg (ref 26.0–34.0)
MCH: 31.7 pg (ref 26.0–34.0)
MCHC: 31.9 g/dL (ref 30.0–36.0)
MCHC: 32.6 g/dL (ref 30.0–36.0)
MCV: 103.5 fL — AB (ref 78.0–100.0)
MCV: 97.2 fL (ref 78.0–100.0)
Platelets: 65 10*3/uL — ABNORMAL LOW (ref 150–400)
Platelets: 75 10*3/uL — ABNORMAL LOW (ref 150–400)
RBC: 2 MIL/uL — ABNORMAL LOW (ref 3.87–5.11)
RBC: 3.19 MIL/uL — ABNORMAL LOW (ref 3.87–5.11)
RDW: 19.8 % — AB (ref 11.5–15.5)
RDW: 21.5 % — ABNORMAL HIGH (ref 11.5–15.5)
WBC: 2.8 10*3/uL — ABNORMAL LOW (ref 4.0–10.5)
WBC: 3.8 10*3/uL — ABNORMAL LOW (ref 4.0–10.5)

## 2017-08-01 LAB — GLUCOSE, CAPILLARY
GLUCOSE-CAPILLARY: 144 mg/dL — AB (ref 65–99)
GLUCOSE-CAPILLARY: 182 mg/dL — AB (ref 65–99)
Glucose-Capillary: 159 mg/dL — ABNORMAL HIGH (ref 65–99)
Glucose-Capillary: 153 mg/dL — ABNORMAL HIGH (ref 65–99)

## 2017-08-01 LAB — PREPARE RBC (CROSSMATCH)

## 2017-08-01 MED ORDER — LEVALBUTEROL HCL 0.63 MG/3ML IN NEBU
0.6300 mg | INHALATION_SOLUTION | Freq: Two times a day (BID) | RESPIRATORY_TRACT | Status: DC
  Administered 2017-08-01: 0.63 mg via RESPIRATORY_TRACT
  Filled 2017-08-01: qty 3

## 2017-08-01 MED ORDER — FLUCONAZOLE IN SODIUM CHLORIDE 200-0.9 MG/100ML-% IV SOLN
200.0000 mg | Freq: Once | INTRAVENOUS | Status: AC
  Administered 2017-08-01: 200 mg via INTRAVENOUS
  Filled 2017-08-01: qty 100

## 2017-08-01 MED ORDER — OXYCODONE HCL 5 MG PO TABS
5.0000 mg | ORAL_TABLET | Freq: Four times a day (QID) | ORAL | Status: DC | PRN
  Administered 2017-08-01: 5 mg via ORAL
  Filled 2017-08-01: qty 1

## 2017-08-01 MED ORDER — SODIUM CHLORIDE 0.9 % IV SOLN
Freq: Once | INTRAVENOUS | Status: AC
  Administered 2017-08-01: 10:00:00 via INTRAVENOUS

## 2017-08-01 NOTE — Discharge Summary (Signed)
Physician Discharge Summary  [name] XFG:[id number] DOB: 06/29/1941 DOA: 07/31/2017  PCP: Hendricks Limes, MD  Admit date: 07/31/2017 Discharge date: 08/01/2017  Admitted From: NH Disposition: Back to NH  Recommendations for Outpatient Follow-up:  1. Follow up with PCP in 1 weeks 2. Follow-up with gastroenterology in 1 week 3. Obtain routine blood work including BMP and CBC at your primary care visit.  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  She reported of some dizziness with movement.  Her hemoglobin was noted to be 7.1, platelets of 89.  She was admitted for 2 units of PRBC transfusion but due to special antibodies it took longer than anticipated.  She was admitted to medical floor for overnight observation. No acute events overnight. Did well with the transfusion and was discharged there after.   She has had multiple admissions due to similar reason in the last few weeks.  She will remain a high risk for admissions for blood transfusions.  It would be beneficial if gastroenterology and PCP is able to set her up for outpatient blood transfusions frequently.  No complaints this morning.   Discharge Diagnoses:  Principal Problem:   Symptomatic anemia Active Problems:   Iron deficiency anemia due to chronic blood loss   RESTLESS LEGS SYNDROME   Hepatic cirrhosis (HCC)   Candidiasis of skin  Symptomatic anemia from chronic blood loss GAVE causing recurrent blood loss -Status post 2 units of transfusion.  Hemoglobin stable -Have notified Dr Doyne Keel office (he is in the  procedures today) about the patients hospitalization. Apparently he is in process of setting up patient with outpatietn transfusion.   Chronic diastolic CHF - Has grade 2 diastolic dysfunction, ejection fraction 60-65%.  Appears to be euvolemic. -Continue home medications  Chronic hypoxic respiratory failure on 2 L nasal cannula COPD - Continue home bronchodilators and supplemental oxygen  Diabetes type 2 -Continue home regimen of Lantus  Concern for possible yeast infection-1 dose of Diflucan given at the time of admission  Nash cirrhosis -Continue rifaximin and lactulose  Chronic kidney disease stage III, stable - Continue home medications and monitor  Thrombocytopenia, chronic -No signs of active bleeding.  This is secondary to cirrhosis  Discharge Instructions   Allergies as of 08/01/2017      Reactions   Acetaminophen Other (See Comments)   Liver dysfunction   Nsaids Other (See Comments)   Liver dysfunction    Aspirin Other (See Comments)   Causes nosebleeds   Theophylline Nausea And Vomiting   Penicillin G Itching   Penicillins Itching, Other (See Comments)   Has patient had a PCN reaction causing immediate rash, facial/tongue/throat swelling, SOB or lightheadedness with hypotension: unknown Has patient had a PCN reaction causing severe rash involving mucus membranes or skin necrosis: unknown Has patient had a PCN reaction that required hospitalization unknown Has patient had a PCN reaction occurring within the last 10 years: yes If all of the above answers are "NO", then may proceed with Cephalosporin use.      Medication List    TAKE these medications   ADVAIR DISKUS 250-50 MCG/DOSE Aepb Generic drug:  Fluticasone-Salmeterol INHALE 1 PUFF INTO LUNGS TWICE A DAY  albuterol (2.5 MG/3ML) 0.083% nebulizer solution Commonly known as:  PROVENTIL Take 2.5 mg by nebulization every 6 (six) hours as needed for wheezing or shortness of breath.   ALPRAZolam 0.5 MG  tablet Commonly known as:  XANAX Take 1 tablet (0.5 mg total) by mouth 2 (two) times daily as needed for anxiety.   atorvastatin 20 MG tablet Commonly known as:  LIPITOR Take 20 mg by mouth daily.   buPROPion 150 MG 24 hr tablet Commonly known as:  WELLBUTRIN XL Take 150 mg by mouth daily.   Cholecalciferol 1000 units tablet Take 1,000 Units by mouth daily.   Darbepoetin Alfa 100 MCG/0.5ML Sosy injection Commonly known as:  ARANESP Inject 100 mcg every 14 (fourteen) days into the skin.   DEPAKENE 250 MG capsule Generic drug:  valproic acid Take 250 mg by mouth 2 (two) times daily.   dicyclomine 20 MG tablet Commonly known as:  BENTYL Take 1 tablet (20 mg total) by mouth 2 (two) times daily.   eucerin cream Apply 1 application daily topically. Apply to nasal septum bilaterally to prevent epistaxis from oxygen-induced drying   HUMALOG KWIKPEN 100 UNIT/ML KiwkPen Generic drug:  insulin lispro Inject 3 Units into the skin daily. Before breakfast   iron polysaccharides 150 MG capsule Commonly known as:  NIFEREX Take 150 mg by mouth 3 (three) times daily with meals.   isosorbide mononitrate 30 MG 24 hr tablet Commonly known as:  IMDUR Take 1 tablet (30 mg total) by mouth daily.   lactulose 10 GM/15ML solution Commonly known as:  CHRONULAC Take 10 g by mouth 4 (four) times daily.   LANTUS SOLOSTAR 100 UNIT/ML Solostar Pen Generic drug:  Insulin Glargine Inject 60 Units into the skin daily at 10 pm.   levalbuterol 0.63 MG/3ML nebulizer solution Commonly known as:  XOPENEX Take 0.63 mg by nebulization 2 (two) times daily.   Melatonin 3 MG Tabs Take 3 mg by mouth at bedtime.   multivitamin with minerals Tabs tablet Take 1 tablet by mouth daily.   nitroGLYCERIN 0.4 MG SL tablet Commonly known as:  NITROSTAT Place 1 tablet (0.4 mg total) under the tongue every 5 (five) minutes as needed for chest pain. No more than 3 in 15 minutes   oxyCODONE 5 MG immediate release  tablet Commonly known as:  Oxy IR/ROXICODONE Take 1 tablet (5 mg total) by mouth every 6 (six) hours as needed for moderate pain or severe pain.   OXYGEN Inhale 2 L into the lungs continuous.   pantoprazole 20 MG tablet Commonly known as:  PROTONIX Take 20 mg by mouth daily.   potassium chloride SA 20 MEQ tablet Commonly known as:  K-DUR,KLOR-CON Take 20 mEq by mouth daily.   promethazine 12.5 MG tablet Commonly known as:  PHENERGAN Take 12.5 mg by mouth every 12 (twelve) hours.   QUEtiapine 100 MG tablet Commonly known as:  SEROQUEL Take 100 mg by mouth at bedtime.   rifaximin 550 MG Tabs tablet Commonly known as:  XIFAXAN Take 550 mg by mouth 2 (two) times daily.   rOPINIRole 4 MG tablet Commonly known as:  REQUIP Take 4 mg by mouth at bedtime.   sucralfate 1 GM/10ML suspension Commonly known as:  CARAFATE Take 1 g by mouth every 6 (six) hours as needed (ulcer).   SYSTANE BALANCE 0.6 % Soln Generic drug:  Propylene Glycol Place 1 drop into both eyes every morning.   torsemide 20 MG tablet Commonly known as:  DEMADEX Take 40 mg  by mouth daily.   traMADol 50 MG tablet Commonly known as:  ULTRAM Take 1 tablet (50 mg total) by mouth every 12 (twelve) hours as needed for moderate pain. Not to be given with Xanax.      Follow-up Information    Hendricks Limes, MD. Schedule an appointment as soon as possible for a visit in 1 week(s).   Specialty:  Internal Medicine Contact information: [place] [address] [phone]        Yetta Flock, MD. Schedule an appointment as soon as possible for a visit in 1 week(s).   Specialty:  Gastroenterology Contact information: [address] [place] [address] [phone]          Allergies  Allergen Reactions  . Acetaminophen Other (See Comments)    Liver dysfunction  . Nsaids Other (See Comments)    Liver dysfunction   . Aspirin Other (See Comments)    Causes nosebleeds   . Theophylline Nausea And Vomiting  . Penicillin G Itching  . Penicillins Itching and Other (See Comments)    Has patient had a PCN reaction causing immediate rash, facial/tongue/throat swelling, SOB or lightheadedness with hypotension: unknown Has patient had a PCN reaction causing severe rash involving mucus membranes or skin necrosis: unknown Has patient had a PCN reaction that required hospitalization unknown Has patient had a PCN reaction occurring within the last 10 years: yes If all of the above answers are "NO", then may proceed with Cephalosporin use.     On your next visit with your primary care physician please Get Medicines reviewed and adjusted.   Please request your Prim.MD to go over all Hospital Tests and Procedure/Radiological results at the follow up, please get all Hospital records sent to your Prim MD by signing hospital release before you go home.   If you experience worsening of your admission symptoms, develop shortness of breath, life threatening emergency, suicidal or homicidal thoughts you must seek medical attention immediately by calling 911 or calling your MD immediately  if symptoms less severe.  You Must read complete instructions/literature along with all the possible adverse reactions/side effects for all the Medicines you take and that have been prescribed to you. Take any new Medicines after you have completely understood and accpet all the possible adverse reactions/side effects.   Do not drive, operate heavy machinery, perform activities at heights, swimming or participation in water activities or provide baby sitting services if your were admitted for syncope or siezures until you have seen by Primary MD or a Neurologist and advised to do so again.  Do not drive when taking Pain medications.    Do not take more than prescribed Pain, Sleep and Anxiety Medications  Special Instructions: If you have smoked or chewed Tobacco  in the last 2 yrs  please stop smoking, stop any regular Alcohol  and or any Recreational drug use.  Wear Seat belts while driving.   Please note  You were cared for by a hospitalist during your hospital stay. If you have any questions about your discharge medications or the care you received while you were in the hospital after you are discharged, you can call the unit and asked to speak with the hospitalist on call if the hospitalist that took care of you is not available. Once you are discharged, your primary care physician will handle any further medical issues. Please note that NO REFILLS for any discharge medications will be authorized once you  are discharged, as it is imperative that you return to your primary care physician (or establish a relationship with a primary care physician if you do not have one) for your aftercare needs so that they can reassess your need for medications and monitor your lab values.   Increase activity slowly        Consultations:  None   Procedures/Studies: Dg Chest 2 View  Result Date: 07/02/2017 CLINICAL DATA:  Fever with cough EXAM: CHEST  2 VIEW COMPARISON:  06/13/2017 FINDINGS: Cardiomegaly with aortic atherosclerosis. Mild central vascular congestion without pneumonic consolidation. Left perihilar atelectasis and/or scarring. No effusion or pneumothorax. Chronic right seventh and ninth rib fractures. Mild chronic degenerative change along the dorsal spine. No acute osseous abnormality. IMPRESSION: Cardiomegaly with aortic atherosclerosis. Mild pulmonary vascular congestion without acute pneumonic consolidation. Electronically Signed   By: Ashley Royalty M.D.   On: 07/02/2017 21:18      Subjective:   Discharge Exam: Vitals:   08/01/17 0930 08/01/17 0952  BP: (!) 131/52 (!) 147/55  Pulse: (!) 58 (!) 58  Resp: 19 15  Temp: 98.2 F (36.8 C) 98.5 F (36.9 C)  SpO2: 99% 99%   Vitals:   08/01/17 0738 08/01/17 0917 08/01/17 0930 08/01/17 0952  BP:  (!) 131/52  (!) 131/52 (!) 147/55  Pulse:  (!) 58 (!) 58 (!) 58  Resp:  19 19 15   Temp:  98.2 F (36.8 C) 98.2 F (36.8 C) 98.5 F (36.9 C)  TempSrc:  Oral Oral Oral  SpO2: 98% 99% 99% 99%  Weight:      Height:        General: Pt is alert, awake, not in acute distress; slight erythema under her breast-fluconazole given Cardiovascular: RRR, S1/S2 +, no rubs, no gallops trace lower extremity pitting pedal edema Respiratory: CTA bilaterally, no wheezing, no rhonchi Abdominal: Soft, NT, ND, bowel sounds + Extremities: no edema, no cyanosis    The results of significant diagnostics from this hospitalization (including imaging, microbiology, ancillary and laboratory) are listed below for reference.     Microbiology: No results found for this or any previous visit (from the past 240 hour(s)).   Labs: BNP (last 3 results) Recent Labs    06/13/17 1826  BNP 36.6   Basic Metabolic Panel: Recent Labs  Lab 07/31/17 1709 08/01/17 0351  NA 140 141  K 3.9 4.2  CL 107 108  CO2 26 27  GLUCOSE 256* 194*  BUN 31* 29*  CREATININE 1.38* 1.27*  CALCIUM 8.3* 8.1*   Liver Function Tests: Recent Labs  Lab 08/01/17 0351  AST 33  ALT 23  ALKPHOS 89  BILITOT 1.2  PROT 4.6*  ALBUMIN 2.4*   No results for input(s): LIPASE, AMYLASE in the last 168 hours. No results for input(s): AMMONIA in the last 168 hours. CBC: Recent Labs  Lab 07/31/17 1709 08/01/17 0351  WBC 3.3* 2.8*  NEUTROABS 2.5  --   HGB 7.1* 6.6*  HCT 21.8* 20.7*  MCV 102.8* 103.5*  PLT 89* 65*   Cardiac Enzymes: No results for input(s): CKTOTAL, CKMB, CKMBINDEX, TROPONINI in the last 168 hours. BNP: Invalid input(s): POCBNP CBG: Recent Labs  Lab 08/01/17 0017 08/01/17 0747  GLUCAP 182* 159*   D-Dimer No results for input(s): DDIMER in the last 72 hours. Hgb A1c No results for input(s): HGBA1C in the last 72 hours. Lipid Profile No results for input(s): CHOL, HDL, LDLCALC, TRIG, CHOLHDL, LDLDIRECT in the last  72 hours. Thyroid function studies No  results for input(s): TSH, T4TOTAL, T3FREE, THYROIDAB in the last 72 hours.  Invalid input(s): FREET3 Anemia work up No results for input(s): VITAMINB12, FOLATE, FERRITIN, TIBC, IRON, RETICCTPCT in the last 72 hours. Urinalysis    Component Value Date/Time   COLORURINE YELLOW 08/01/2017 0650   APPEARANCEUR CLEAR 08/01/2017 0650   LABSPEC 1.014 08/01/2017 0650   PHURINE 5.0 08/01/2017 0650   GLUCOSEU NEGATIVE 08/01/2017 0650   GLUCOSEU NEGATIVE 07/17/2015 0907   HGBUR NEGATIVE 08/01/2017 0650   BILIRUBINUR NEGATIVE 08/01/2017 0650   BILIRUBINUR negative 02/26/2016 1206   KETONESUR NEGATIVE 08/01/2017 0650   PROTEINUR NEGATIVE 08/01/2017 0650   UROBILINOGEN 4.0 02/26/2016 1206   UROBILINOGEN 0.2 07/17/2015 0907   NITRITE NEGATIVE 08/01/2017 0650   LEUKOCYTESUR NEGATIVE 08/01/2017 0650   Sepsis Labs Invalid input(s): PROCALCITONIN,  WBC,  LACTICIDVEN Microbiology No results found for this or any previous visit (from the past 240 hour(s)).   Time coordinating discharge: Over 30 minutes  SIGNED:   Damita Lack, MD  [facility] 08/01/2017, 11:03 AM Pager   If 7PM-7AM, please contact night-coverage [email or web address] Password [email or web address]

## 2017-08-01 NOTE — Progress Notes (Signed)
Report called to Olivia Mackie, Therapist, sports at [facility]

## 2017-08-01 NOTE — Progress Notes (Signed)
Pt to be discharged via [facility]. PRBC transfusions complete x3. Post transfusion hgb stable. Discharge education complete, all belongings sent with pt at time of discharge.

## 2017-08-01 NOTE — Care Management Obs Status (Signed)
[facility] NOTIFICATION   Patient Details  Name: [name] MRN: [id number] Date of Birth: [birth year]   Medicare Observation Status Notification Given:  Yes  Lynnell Catalan, RN 08/01/2017, 11:39 AM

## 2017-08-01 NOTE — Progress Notes (Signed)
Pt long term resident of [facility] room 201. Will return following blood transfusion this afternoon. Report [phone]. Will arrange transportation when ready.   Sharren Bridge, MSW, LCSW Clinical Social Work 08/01/2017 [phone]

## 2017-08-04 LAB — TYPE AND SCREEN
ABO/RH(D): O POS
Antibody Screen: NEGATIVE
DONOR AG TYPE: NEGATIVE
DONOR AG TYPE: NEGATIVE
Donor AG Type: NEGATIVE
Donor AG Type: NEGATIVE
UNIT DIVISION: 0
UNIT DIVISION: 0
Unit division: 0
Unit division: 0

## 2017-08-04 LAB — BPAM RBC
BLOOD PRODUCT EXPIRATION DATE: 201901132359
Blood Product Expiration Date: 201901022359
Blood Product Expiration Date: 201901072359
Blood Product Expiration Date: 201901092359
ISSUE DATE / TIME: 201812071257
ISSUE DATE / TIME: 201812070616
ISSUE DATE / TIME: 201812070925
UNIT TYPE AND RH: 5100
UNIT TYPE AND RH: 5100
Unit Type and Rh: 5100
Unit Type and Rh: 5100

## 2017-08-11 ENCOUNTER — Encounter: Payer: Self-pay | Admitting: Hematology

## 2017-08-11 ENCOUNTER — Ambulatory Visit (HOSPITAL_BASED_OUTPATIENT_CLINIC_OR_DEPARTMENT_OTHER): Payer: Medicare Other | Admitting: Hematology

## 2017-08-11 ENCOUNTER — Ambulatory Visit (HOSPITAL_BASED_OUTPATIENT_CLINIC_OR_DEPARTMENT_OTHER): Payer: Medicare Other

## 2017-08-11 ENCOUNTER — Telehealth: Payer: Self-pay | Admitting: Hematology

## 2017-08-11 ENCOUNTER — Other Ambulatory Visit: Payer: Self-pay

## 2017-08-11 ENCOUNTER — Encounter (HOSPITAL_COMMUNITY): Payer: Self-pay | Admitting: Nurse Practitioner

## 2017-08-11 ENCOUNTER — Observation Stay (HOSPITAL_COMMUNITY)
Admission: EM | Admit: 2017-08-11 | Discharge: 2017-08-13 | Disposition: A | Discharge status: Home / Self Care | Payer: Medicare Other | Attending: Internal Medicine | Admitting: Internal Medicine

## 2017-08-11 VITALS — BP 148/64 | HR 69 | Temp 98.9°F | Resp 20 | Ht 62.5 in | Wt 181.3 lb

## 2017-08-11 DIAGNOSIS — K922 Gastrointestinal hemorrhage, unspecified: Secondary | ICD-10-CM

## 2017-08-11 DIAGNOSIS — D689 Coagulation defect, unspecified: Secondary | ICD-10-CM

## 2017-08-11 DIAGNOSIS — J9611 Chronic respiratory failure with hypoxia: Secondary | ICD-10-CM | POA: Diagnosis not present

## 2017-08-11 DIAGNOSIS — I130 Hypertensive heart and chronic kidney disease with heart failure and stage 1 through stage 4 chronic kidney disease, or unspecified chronic kidney disease: Secondary | ICD-10-CM | POA: Insufficient documentation

## 2017-08-11 DIAGNOSIS — R161 Splenomegaly, not elsewhere classified: Secondary | ICD-10-CM

## 2017-08-11 DIAGNOSIS — E119 Type 2 diabetes mellitus without complications: Secondary | ICD-10-CM

## 2017-08-11 DIAGNOSIS — D62 Acute posthemorrhagic anemia: Secondary | ICD-10-CM

## 2017-08-11 DIAGNOSIS — D696 Thrombocytopenia, unspecified: Secondary | ICD-10-CM

## 2017-08-11 DIAGNOSIS — J449 Chronic obstructive pulmonary disease, unspecified: Secondary | ICD-10-CM | POA: Insufficient documentation

## 2017-08-11 DIAGNOSIS — E1122 Type 2 diabetes mellitus with diabetic chronic kidney disease: Secondary | ICD-10-CM | POA: Diagnosis not present

## 2017-08-11 DIAGNOSIS — F209 Schizophrenia, unspecified: Secondary | ICD-10-CM | POA: Diagnosis not present

## 2017-08-11 DIAGNOSIS — D649 Anemia, unspecified: Secondary | ICD-10-CM

## 2017-08-11 DIAGNOSIS — N183 Chronic kidney disease, stage 3 (moderate): Secondary | ICD-10-CM | POA: Insufficient documentation

## 2017-08-11 DIAGNOSIS — C189 Malignant neoplasm of colon, unspecified: Secondary | ICD-10-CM | POA: Diagnosis not present

## 2017-08-11 DIAGNOSIS — Z79899 Other long term (current) drug therapy: Secondary | ICD-10-CM | POA: Insufficient documentation

## 2017-08-11 DIAGNOSIS — Z87891 Personal history of nicotine dependence: Secondary | ICD-10-CM | POA: Insufficient documentation

## 2017-08-11 DIAGNOSIS — J45909 Unspecified asthma, uncomplicated: Secondary | ICD-10-CM | POA: Diagnosis not present

## 2017-08-11 DIAGNOSIS — I5032 Chronic diastolic (congestive) heart failure: Secondary | ICD-10-CM | POA: Insufficient documentation

## 2017-08-11 DIAGNOSIS — Z794 Long term (current) use of insulin: Secondary | ICD-10-CM | POA: Diagnosis not present

## 2017-08-11 LAB — CBC & DIFF AND RETIC
BASO%: 0.6 % (ref 0.0–2.0)
Basophils Absolute: 0 10*3/uL (ref 0.0–0.1)
EOS%: 4.8 % (ref 0.0–7.0)
Eosinophils Absolute: 0.2 10*3/uL (ref 0.0–0.5)
HCT: 22.2 % — ABNORMAL LOW (ref 34.8–46.6)
HGB: 7 g/dL — ABNORMAL LOW (ref 11.6–15.9)
Immature Retic Fract: 13.4 % — ABNORMAL HIGH (ref 1.60–10.00)
LYMPH%: 12.6 % — AB (ref 14.0–49.7)
MCH: 31.5 pg (ref 25.1–34.0)
MCHC: 31.5 g/dL (ref 31.5–36.0)
MCV: 100 fL (ref 79.5–101.0)
MONO#: 0.3 10*3/uL (ref 0.1–0.9)
MONO%: 9.6 % (ref 0.0–14.0)
NEUT%: 72.4 % (ref 38.4–76.8)
NEUTROS ABS: 2.6 10*3/uL (ref 1.5–6.5)
Platelets: 73 10*3/uL — ABNORMAL LOW (ref 145–400)
RBC: 2.22 10*6/uL — AB (ref 3.70–5.45)
RDW: 19.7 % — ABNORMAL HIGH (ref 11.2–14.5)
Retic %: 7 % — ABNORMAL HIGH (ref 0.70–2.10)
Retic Ct Abs: 155.4 10*3/uL — ABNORMAL HIGH (ref 33.70–90.70)
WBC: 3.6 10*3/uL — AB (ref 3.9–10.3)
lymph#: 0.5 10*3/uL — ABNORMAL LOW (ref 0.9–3.3)

## 2017-08-11 LAB — COMPREHENSIVE METABOLIC PANEL
ALK PHOS: 109 U/L (ref 40–150)
ALT: 25 U/L (ref 0–55)
AST: 34 U/L (ref 5–34)
Albumin: 2.5 g/dL — ABNORMAL LOW (ref 3.5–5.0)
Anion Gap: 7 mEq/L (ref 3–11)
BUN: 42.1 mg/dL — ABNORMAL HIGH (ref 7.0–26.0)
CO2: 25 meq/L (ref 22–29)
Calcium: 8 mg/dL — ABNORMAL LOW (ref 8.4–10.4)
Chloride: 112 mEq/L — ABNORMAL HIGH (ref 98–109)
Creatinine: 1.4 mg/dL — ABNORMAL HIGH (ref 0.6–1.1)
EGFR: 38 mL/min/{1.73_m2} — AB (ref >60)
GLUCOSE: 100 mg/dL (ref 70–140)
POTASSIUM: 4.1 meq/L (ref 3.5–5.1)
SODIUM: 144 meq/L (ref 136–145)
Total Bilirubin: 0.8 mg/dL (ref 0.20–1.20)
Total Protein: 4.8 g/dL — ABNORMAL LOW (ref 6.4–8.3)

## 2017-08-11 LAB — FERRITIN: FERRITIN: 219 ng/mL (ref 9–269)

## 2017-08-11 LAB — PREPARE RBC (CROSSMATCH)

## 2017-08-11 LAB — PROTIME-INR
INR: 1.2 — AB (ref 2.00–3.50)
Protime: 14.4 Seconds — ABNORMAL HIGH (ref 10.6–13.4)

## 2017-08-11 MED ORDER — PROMETHAZINE HCL 25 MG PO TABS
12.5000 mg | ORAL_TABLET | Freq: Two times a day (BID) | ORAL | Status: DC
  Administered 2017-08-12 – 2017-08-13 (×4): 12.5 mg via ORAL
  Filled 2017-08-11 (×4): qty 1

## 2017-08-11 MED ORDER — ALBUTEROL SULFATE (2.5 MG/3ML) 0.083% IN NEBU: 2.5000 mg | INHALATION_SOLUTION | Freq: Four times a day (QID) | RESPIRATORY_TRACT | Status: DC | PRN

## 2017-08-11 MED ORDER — RIFAXIMIN 550 MG PO TABS
550.0000 mg | ORAL_TABLET | Freq: Two times a day (BID) | ORAL | Status: DC
  Administered 2017-08-12 – 2017-08-13 (×4): 550 mg via ORAL
  Filled 2017-08-11 (×4): qty 1

## 2017-08-11 MED ORDER — OXYCODONE HCL 5 MG PO TABS
5.0000 mg | ORAL_TABLET | Freq: Four times a day (QID) | ORAL | Status: DC | PRN
  Filled 2017-08-11: qty 1

## 2017-08-11 MED ORDER — SODIUM CHLORIDE 0.9 % IV SOLN
80.0000 mg | Freq: Once | INTRAVENOUS | Status: AC
  Administered 2017-08-11: 17:00:00 80 mg via INTRAVENOUS
  Filled 2017-08-11: qty 80

## 2017-08-11 MED ORDER — POLYSACCHARIDE IRON COMPLEX 150 MG PO CAPS
150.0000 mg | ORAL_CAPSULE | Freq: Three times a day (TID) | ORAL | Status: DC
  Administered 2017-08-12 – 2017-08-13 (×3): 150 mg via ORAL
  Filled 2017-08-11 (×3): qty 1

## 2017-08-11 MED ORDER — SODIUM CHLORIDE 0.9 % IV SOLN
8.0000 mg/h | INTRAVENOUS | Status: DC
  Administered 2017-08-11 – 2017-08-12 (×2): 8 mg/h via INTRAVENOUS
  Filled 2017-08-11 (×5): qty 80

## 2017-08-11 MED ORDER — SUCRALFATE 1 GM/10ML PO SUSP: 1.0000 g | Freq: Four times a day (QID) | ORAL | Status: DC | PRN

## 2017-08-11 MED ORDER — LEVALBUTEROL HCL 0.63 MG/3ML IN NEBU
0.6300 mg | INHALATION_SOLUTION | Freq: Two times a day (BID) | RESPIRATORY_TRACT | Status: DC
  Administered 2017-08-12 – 2017-08-13 (×3): 0.63 mg via RESPIRATORY_TRACT
  Filled 2017-08-11 (×3): qty 3

## 2017-08-11 MED ORDER — POLYVINYL ALCOHOL 1.4 % OP SOLN
1.0000 [drp] | Freq: Every morning | OPHTHALMIC | Status: DC
  Administered 2017-08-12 – 2017-08-13 (×2): 1 [drp] via OPHTHALMIC
  Filled 2017-08-11: qty 15

## 2017-08-11 MED ORDER — VALPROIC ACID 250 MG PO CAPS
250.0000 mg | ORAL_CAPSULE | Freq: Two times a day (BID) | ORAL | Status: DC
  Administered 2017-08-12 – 2017-08-13 (×4): 250 mg via ORAL
  Filled 2017-08-11 (×4): qty 1

## 2017-08-11 MED ORDER — INSULIN GLARGINE 100 UNIT/ML ~~LOC~~ SOLN
60.0000 [IU] | Freq: Every day | SUBCUTANEOUS | Status: DC
  Administered 2017-08-12 (×2): 60 [IU] via SUBCUTANEOUS
  Filled 2017-08-11 (×3): qty 0.6

## 2017-08-11 MED ORDER — TRANEXAMIC ACID 650 MG PO TABS: 1300.0000 mg | ORAL_TABLET | Freq: Two times a day (BID) | ORAL | 1 refills | Status: DC

## 2017-08-11 MED ORDER — ROPINIROLE HCL 1 MG PO TABS
4.0000 mg | ORAL_TABLET | Freq: Every day | ORAL | Status: DC
  Administered 2017-08-12 (×2): 4 mg via ORAL
  Filled 2017-08-11 (×2): qty 4

## 2017-08-11 MED ORDER — BUPROPION HCL ER (XL) 150 MG PO TB24
150.0000 mg | ORAL_TABLET | Freq: Every day | ORAL | Status: DC
  Administered 2017-08-12 – 2017-08-13 (×2): 150 mg via ORAL
  Filled 2017-08-11 (×3): qty 1

## 2017-08-11 MED ORDER — ALPRAZOLAM 0.5 MG PO TABS
0.5000 mg | ORAL_TABLET | Freq: Two times a day (BID) | ORAL | Status: DC | PRN
  Administered 2017-08-11 – 2017-08-12 (×3): 0.5 mg via ORAL
  Filled 2017-08-11 (×3): qty 1

## 2017-08-11 MED ORDER — MOMETASONE FURO-FORMOTEROL FUM 200-5 MCG/ACT IN AERO
2.0000 | INHALATION_SPRAY | Freq: Two times a day (BID) | RESPIRATORY_TRACT | Status: DC
  Administered 2017-08-12 (×2): 2 via RESPIRATORY_TRACT
  Filled 2017-08-11: qty 8.8

## 2017-08-11 MED ORDER — MAGNESIUM HYDROXIDE 400 MG/5ML PO SUSP: 30.0000 mL | Freq: Every day | ORAL | Status: DC | PRN

## 2017-08-11 MED ORDER — INSULIN LISPRO 100 UNIT/ML (KWIKPEN): 3.0000 [IU] | PEN_INJECTOR | Freq: Every day | SUBCUTANEOUS | Status: DC

## 2017-08-11 MED ORDER — NITROGLYCERIN 0.4 MG SL SUBL: 0.4000 mg | SUBLINGUAL_TABLET | SUBLINGUAL | Status: DC | PRN

## 2017-08-11 MED ORDER — QUETIAPINE FUMARATE 100 MG PO TABS
100.0000 mg | ORAL_TABLET | Freq: Every day | ORAL | Status: DC
  Administered 2017-08-12 (×2): 100 mg via ORAL
  Filled 2017-08-11 (×2): qty 1

## 2017-08-11 MED ORDER — ADULT MULTIVITAMIN W/MINERALS CH
1.0000 | ORAL_TABLET | Freq: Every day | ORAL | Status: DC
  Administered 2017-08-12 – 2017-08-13 (×2): 1 via ORAL
  Filled 2017-08-11 (×3): qty 1

## 2017-08-11 MED ORDER — ATORVASTATIN CALCIUM 20 MG PO TABS
20.0000 mg | ORAL_TABLET | Freq: Every day | ORAL | Status: DC
  Administered 2017-08-12 (×2): 20 mg via ORAL
  Filled 2017-08-11 (×2): qty 1

## 2017-08-11 MED ORDER — LACTULOSE 10 GM/15ML PO SOLN
10.0000 g | Freq: Four times a day (QID) | ORAL | Status: DC
  Administered 2017-08-12 – 2017-08-13 (×4): 10 g via ORAL
  Filled 2017-08-11 (×5): qty 15

## 2017-08-11 MED ORDER — TORSEMIDE 20 MG PO TABS
40.0000 mg | ORAL_TABLET | Freq: Every day | ORAL | Status: DC
  Administered 2017-08-12 – 2017-08-13 (×2): 40 mg via ORAL
  Filled 2017-08-11 (×2): qty 2

## 2017-08-11 MED ORDER — ISOSORBIDE MONONITRATE ER 30 MG PO TB24
30.0000 mg | ORAL_TABLET | Freq: Every day | ORAL | Status: DC
  Administered 2017-08-12 – 2017-08-13 (×2): 30 mg via ORAL
  Filled 2017-08-11 (×3): qty 1

## 2017-08-11 MED ORDER — TRAMADOL HCL 50 MG PO TABS: 50.0000 mg | ORAL_TABLET | Freq: Two times a day (BID) | ORAL | Status: DC | PRN

## 2017-08-11 MED ORDER — VITAMIN D 1000 UNITS PO TABS
1000.0000 [IU] | ORAL_TABLET | Freq: Every day | ORAL | Status: DC
  Administered 2017-08-12 – 2017-08-13 (×2): 1000 [IU] via ORAL
  Filled 2017-08-11 (×3): qty 1

## 2017-08-11 NOTE — Telephone Encounter (Signed)
Scheduled appt per 12/17 los - Gave patient AVS and calender per los.  

## 2017-08-11 NOTE — ED Notes (Signed)
Bed: IT94 Expected date:  Expected time:  Means of arrival:  Comments: [facility]
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
## 2017-08-11 NOTE — ED Notes (Signed)
Bed assigned @ 2012 rm 1607

## 2017-08-11 NOTE — Clinical Social Work Note (Addendum)
Clinical Social Work Assessment  Patient Details  Name: [name] MRN: [id number] Date of Birth: 12-29-1940  Date of referral:  08/11/17               Reason for consult:  Facility Placement                Permission sought to share information with:  Facility Art therapist granted to share information::  Yes, Verbal Permission Granted  Name::        Agency::     Relationship::     Contact Information:     Housing/Transportation Living arrangements for the past 2 months:  [place] of Information:  Patient, Adult Children Patient Interpreter Needed:  None Criminal Activity/Legal Involvement Pertinent to Current Situation/Hospitalization:    Significant Relationships:  Adult Children, Siblings Lives with:  Facility Resident Do you feel safe going back to the place where you live?  Yes Need for family participation in patient care:  Yes (Comment)  Care giving concerns:  None listed by pt/family    Social Worker assessment / plan:  CSW met with pt and pt's daughter and confirmed pt's plan to be discharged back to [facility] to live at discharge.  CSW provided active listening and validated pt's concerns.   CSW DEPT was given permission to complete FL-2 and send referrals out to SNF facilities via the hub per pt's request.  Pt has been at [facility] since the "end of October", prior to being admitted to [facility].  Employment status:  Retired Forensic scientist:  Medicaid [place], [employer] PT Recommendations:  Not assessed at this time Information / Referral to community resources:     Patient/Family's Response to care:  Patient alert and oriented.  Patient and pt's daughter agreeable to plan.  Pt's daughter [name] supportive and strongly involved in pt.'s care.  Pt.'s daughter and the pt pleasant and appreciated CSW intervention.    Patient/Family's Understanding of and Emotional Response to Diagnosis, Current Treatment, and  Prognosis:  Pt and family understand current prognosis and treatment.  Emotional Assessment Appearance:    Attitude/Demeanor/Rapport:    Affect (typically observed):  Anxious, Frustrated, Pleasant, Overwhelmed Orientation:  Oriented to Situation, Oriented to  Time, Oriented to Place, Oriented to Self Alcohol / Substance use:    Psych involvement (Current and /or in the community):     Discharge Needs  Concerns to be addressed:    Readmission within the last 30 days:  Yes Current discharge risk:  None Barriers to Discharge:  No Barriers Identified   Claudine Mouton, LCSWA 08/11/2017, 10:12 PM

## 2017-08-11 NOTE — Progress Notes (Signed)
Pt Hgb 7.0 today. Per Dr. Irene Limbo preferred for pt to go to the ED to get 2-3 units of blood. Unable to acquire appt in infusion or sickle cell today or tomorrow. Pt and family compliant with plan. Pt has GI bleed that was treated with 3U PRBCs on 12/7 getting Hgb up to 10 and has since dropped significantly. Report given to ED RN and pt transferred to Rm 25.

## 2017-08-11 NOTE — ED Triage Notes (Signed)
Patient has an active GI bleed with bloody loose stools. Patient was in the hospital at the beginning of dec and received blood. Patient is back type and cross completed and HBG 7

## 2017-08-11 NOTE — H&P (Signed)
History and Physical  [name] NLZ:[id number] DOB: Jun 08, 1941 DOA: 08/11/2017  Referring physician: EDP PCP: Hendricks Limes, MD   Chief Complaint: recurrent anemia  HPI: [name] is a 76 y.o. female  with medical history significant ofNash cirrhosis, history of [facility], chronic diastolic CHF, COPD with chronic hypoxic respiratory failure on home o2 sent from hematology office due to anemia requiring blood transfusion. Patient has know gi bleed has been hospitalized for multiple times to get blood transfusions.  Today she is seeing hematology in the process of trying to set up outpatient blood transfusion every 2 weeks.  Her hemoglobin is found to be 7, dropped from 10 tested 10 days ago.  She is sent to the hospital to get blood transfusion.  Otherwise there are no acute findings. 2units prbc ordered by EDP, hospitalist called to continue manage the patient.    Review of Systems:  Detail per HPI, Review of systems are otherwise negative  Past Medical History:  Diagnosis Date  . Adenomatous colon polyp   . Anxiety   . Asthma   . Back pain   . Benign neoplasm of colon   . CAP (community acquired pneumonia) 2015  . Carpal tunnel syndrome on both sides   . Chronic airway obstruction, not elsewhere classified   . Chronic diastolic CHF (congestive heart failure) ([place])    a. 03/26/2014 Echo: EF 55-60%, no rwma, mild AS/AI, mod-sev Ca2+ MV annulus, mildly to mod dil LA.  [name] Coarse tremors    a. arms.  . Diverticulosis of colon (without mention of hemorrhage)   . Esophageal reflux   . Falls frequently    completed HHPT/OT 06/2014, PT unmet goals  . GAVE (gastric antral vascular ectasia)    angiodysplasia; s/p multiple APCs, discussing RFA with [facility] GI Dr Newman Pies (06/2015)  . H/O hiatal hernia   . Hepatitis   . Hiatal hernia   . Hyperlipidemia   . Iron deficiency anemia secondary to blood loss  (chronic)    a. frequent PRBC transfusions.  . Liver cirrhosis secondary to nonalcoholic steatohepatitis (NASH) ([place])    a. dx'd 1990's  . Major depressive disorder, recurrent episode, severe, specified as with psychotic behavior   . Memory loss   . Midsternal chest pain    a. conservatively managed ->poor candidate for cath/anticoagulation.  . Mild aortic stenosis    a. 03/2014 Valve area (VTI): 2.89 cm^2, Valve area (Vmax): 2.7 cm^2.  . Morbid obesity ([place])   . Neck pain   . Non-obstructive CAD   . On home oxygen therapy    "3L; 24/7" (11/29/2015)  . OSA (obstructive sleep apnea)    a. does not use CPAP. (01/25/2015)  . Osteoarthrosis, unspecified whether generalized or localized, unspecified site   . Osteoporosis, unspecified   . Palpitations   . Portal hypertensive gastropathy ([place])   . Protein calorie malnutrition ([place])   . Recurrent UTI (urinary tract infection)    h/o hospitalization with urosepsis 2015, but thought large component colonization/bacteriuria, only treat if symptomatic ([place])  . Restless legs syndrome (RLS)   . Schizophrenia ([place])   . Thrombocytopenia ([place])   . Type II diabetes mellitus ([place])   . Unspecified chronic bronchitis ([place])   . Unspecified essential hypertension    Past Surgical History:  Procedure Laterality Date  . APPENDECTOMY    . BALLOON DILATION  06/12/2012   Procedure: BALLOON DILATION;  Surgeon: Lafayette Dragon, MD;  Location: [facility]  END[facility];  Service: Endoscopy;  Laterality: N/A;  ?balloon  . BREAST BIOPSY     bilateral  . CESAREAN SECTION     x 3  . ENTEROSCOPY N/A 02/25/2015   Procedure: ENTEROSCOPY;  Surgeon: [name] Ada, MD;  Location: [facility] ENDOSCOPY;  Service: Endoscopy;  Laterality: N/A;  . ENTEROSCOPY N/A 06/27/2015   Procedure: ENTEROSCOPY;  Surgeon: Manus Gunning, MD;  Location: [facility] ENDOSCOPY;  Service: Gastroenterology;  Laterality: N/A;  . ESOPHAGOGASTRODUODENOSCOPY  06/12/2012   Procedure: ESOPHAGOGASTRODUODENOSCOPY (EGD);   Surgeon: Lafayette Dragon, MD;  Location: [facility] ENDOSCOPY;  Service: Endoscopy;  Laterality: N/A;  . ESOPHAGOGASTRODUODENOSCOPY  09/10/2012   Procedure: ESOPHAGOGASTRODUODENOSCOPY (EGD);  Surgeon: Lafayette Dragon, MD;  Location: [facility];  Service: Endoscopy;  Laterality: N/A;  . ESOPHAGOGASTRODUODENOSCOPY N/A 01/18/2013   Procedure: ESOPHAGOGASTRODUODENOSCOPY (EGD);  Surgeon: Lafayette Dragon, MD;  Location: [facility] ENDOSCOPY;  Service: Endoscopy;  Laterality: N/A;  . ESOPHAGOGASTRODUODENOSCOPY N/A 05/24/2013   Procedure: ESOPHAGOGASTRODUODENOSCOPY (EGD);  Surgeon: Jerene Bears, MD;  Location: [place];  Service: Gastroenterology;  Laterality: N/A;  . ESOPHAGOGASTRODUODENOSCOPY N/A 10/20/2014   Procedure: ESOPHAGOGASTRODUODENOSCOPY (EGD);  Surgeon: Lafayette Dragon, MD;  Location: [facility]OS[facility];  Service: Endoscopy;  Laterality: N/A;  . ESOPHAGOGASTRODUODENOSCOPY N/A 06/18/2017   Procedure: ESOPHAGOGASTRODUODENOSCOPY (EGD) with RFA;  Surgeon: [name] Flock, MD;  Location: [facility] ENDOSCOPY;  Service: Gastroenterology;  Laterality: N/A;  . EUS  09/2015   antral CAVL, ablated; nodular erosive gastritis; mult pancreatic cysts rec rpt CT pancreas protocol or MRI to survey pancreas cysts 1-2 yrs (Dr Amie Critchley)  . HEMORROIDECTOMY    . HOT HEMOSTASIS  09/10/2012   Procedure: HOT HEMOSTASIS (ARGON PLASMA COAGULATION/BICAP);  Surgeon: Lafayette Dragon, MD;  Location: [facility]OS[facility];  Service: Endoscopy;  Laterality: N/A;  . HOT HEMOSTASIS N/A 05/24/2013   Procedure: HOT HEMOSTASIS (ARGON PLASMA COAGULATION/BICAP);  Surgeon: Jerene Bears, MD;  Location: [place];  Service: Gastroenterology;  Laterality: N/A;  . HOT HEMOSTASIS N/A 10/20/2014   Procedure: HOT HEMOSTASIS (ARGON PLASMA COAGULATION/BICAP);  Surgeon: Lafayette Dragon, MD;  Location: [facility] ENDOS[facility];  Service: Endoscopy;  Laterality: N/A;  . US ECHOCARDIOGRAPHY  07/2014   mild LVH, EF 60-65%, normal wall motion, mild AR, mod dilated LA, mildly dilated RA, peak PA  pressure 39mmHg  . VAGINAL HYSTERECTOMY     Social History:  reports that she quit smoking about 24 years ago. Her smoking use included cigarettes. She has a 37.50 pack-year smoking history. she has never used smokeless tobacco. She reports that she does not drink alcohol or use drugs. Patient lives at *& is able to participate in activities of daily living independently *  Allergies  Allergen Reactions  . Acetaminophen Other (See Comments)    Liver dysfunction  . Nsaids Other (See Comments)    Liver dysfunction   . Aspirin Other (See Comments)    Causes nosebleeds  . Theophylline Nausea And Vomiting  . Penicillin G Itching    Has patient had a PCN reaction causing immediate rash, facial/tongue/throat swelling, SOB or lightheadedness with hypotension: Unknown Has patient had a PCN reaction causing severe rash involving mucus membranes or skin necrosis: Unknown Has patient had a PCN reaction that required hospitalization: Unknown Has patient had a PCN reaction occurring within the last 10 years:YES If all of the above answers are "NO", then may proceed with Cephalosporin use.   [name] Penicillins Itching and Other (See Comments)    Has patient had a PCN reaction causing immediate rash, facial/tongue/throat swelling,  SOB or lightheadedness with hypotension: unknown Has patient had a PCN reaction causing severe rash involving mucus membranes or skin necrosis: unknown Has patient had a PCN reaction that required hospitalization unknown Has patient had a PCN reaction occurring within the last 10 years: yes If all of the above answers are "NO", then may proceed with Cephalosporin use.     Family History  Problem Relation Age of Onset  . Heart disease Mother   . Cervical cancer Mother   . Kidney disease Mother   . Diabetes Mother   . Heart attack Mother   . Diabetes Father   . Heart attack Father   . Multiple sclerosis Other   . Breast cancer Sister   . Multiple sclerosis Sister   .  Breast cancer Maternal Aunt        x 2  . Diabetes Sister   . Stroke Daughter   . Stroke Daughter   . Lupus Daughter   . Colon cancer Neg Hx       Prior to Admission medications   Medication Sig Start Date End Date Taking? Authorizing Provider  ADVAIR DISKUS 250-50 MCG/DOSE AEPB INHALE 1 PUFF INTO LUNGS TWICE A DAY 05/29/15   Ria Bush, MD  albuterol (PROVENTIL) (2.5 MG/3ML) 0.083% nebulizer solution Take 2.5 mg by nebulization every 6 (six) hours as needed for wheezing or shortness of breath.    [provider]  ALPRAZolam Duanne Moron) 0.5 MG tablet Take 1 tablet (0.5 mg total) by mouth 2 (two) times daily as needed for anxiety. 07/16/17   Florencia Reasons, MD  atorvastatin (LIPITOR) 20 MG tablet Take 20 mg by mouth daily.     [provider]  buPROPion (WELLBUTRIN XL) 150 MG 24 hr tablet Take 150 mg by mouth daily.    [provider]  Cholecalciferol 1000 units tablet Take 1,000 Units by mouth daily.    [provider]  Darbepoetin Alfa (ARANESP) 100 MCG/0.5ML SOSY injection Inject 100 mcg every 14 (fourteen) days into the skin.    [provider]  dicyclomine (BENTYL) 20 MG tablet Take 1 tablet (20 mg total) by mouth 2 (two) times daily. 04/07/16   Doristine Devoid, PA-C  Insulin Glargine (LANTUS SOLOSTAR) 100 UNIT/ML Solostar Pen Inject 60 Units into the skin daily at 10 pm.     [provider]  insulin lispro (HUMALOG KWIKPEN) 100 UNIT/ML KiwkPen Inject 3 Units into the skin daily. Before breakfast    [provider]  iron polysaccharides (NIFEREX) 150 MG capsule Take 150 mg by mouth 3 (three) times daily with meals.    [provider]  isosorbide mononitrate (IMDUR) 30 MG 24 hr tablet Take 1 tablet (30 mg total) by mouth daily. 04/01/16   Richardson Dopp T, PA-C  lactulose (CHRONULAC) 10 GM/15ML solution Take 10 g by mouth 4 (four) times daily.    [provider]  levalbuterol Penne Lash) 0.63 MG/3ML nebulizer  solution Take 0.63 mg by nebulization 2 (two) times daily.  06/05/14   [provider]  Melatonin 3 MG TABS Take 3 mg by mouth at bedtime.    [provider]  Multiple Vitamin (MULTIVITAMIN WITH MINERALS) TABS tablet Take 1 tablet by mouth daily.     [provider]  nitroGLYCERIN (NITROSTAT) 0.4 MG SL tablet Place 1 tablet (0.4 mg total) under the tongue every 5 (five) minutes as needed for chest pain. No more than 3 in 15 minutes 02/16/16   Sherren Mocha, MD  oxyCODONE (OXY  IR/ROXICODONE) 5 MG immediate release tablet Take 1 tablet (5 mg total) by mouth every 6 (six) hours as needed for moderate pain or severe pain. 07/16/17   Florencia Reasons, MD  OXYGEN Inhale 2 L into the lungs continuous.     [provider]  pantoprazole (PROTONIX) 20 MG tablet Take 20 mg by mouth daily.     [provider]  potassium chloride SA (K-DUR,KLOR-CON) 20 MEQ tablet Take 20 mEq by mouth daily.    [provider]  promethazine (PHENERGAN) 12.5 MG tablet Take 12.5 mg by mouth every 12 (twelve) hours.    [provider]  Propylene Glycol (SYSTANE BALANCE) 0.6 % SOLN Place 1 drop into both eyes every morning.    [provider]  QUEtiapine (SEROQUEL) 100 MG tablet Take 100 mg by mouth at bedtime.    [provider]  rifaximin (XIFAXAN) 550 MG TABS tablet Take 550 mg by mouth 2 (two) times daily.    [provider]  rOPINIRole (REQUIP) 4 MG tablet Take 4 mg by mouth at bedtime.    [provider]  Skin Protectants, Misc. (EUCERIN) cream Apply 1 application daily topically. Apply to nasal septum bilaterally to prevent epistaxis from oxygen-induced drying     [provider]  sucralfate (CARAFATE) 1 GM/10ML suspension Take 1 g by mouth every 6 (six) hours as needed (ulcer).     [provider]  torsemide (DEMADEX) 20 MG tablet Take 40 mg by mouth daily.    [provider]  traMADol (ULTRAM) 50 MG tablet  Take 1 tablet (50 mg total) by mouth every 12 (twelve) hours as needed for moderate pain. Not to be given with Xanax. 07/16/17   Florencia Reasons, MD  tranexamic acid (LYSTEDA) 650 MG TABS tablet Take 2 tablets (1,300 mg total) by mouth 2 (two) times daily. 08/11/17   Brunetta Genera, MD  valproic acid (DEPAKENE) 250 MG capsule Take 250 mg by mouth 2 (two) times daily.    [provider]    Physical Exam: BP (!) 133/46 (BP Location: Right Arm)   Pulse 60   Temp 99 F (37.2 C) (Oral)   Resp 16   Ht 5' 2.5" (1.588 m)   Wt 82.1 kg (181 lb)   SpO2 100%   BMI 32.58 kg/m   General:  Chronically ill, pale, NAD, AAox3 Eyes: PERRL ENT: unremarkable Neck: supple, no JVD Cardiovascular: RRR Respiratory: CTABL Abdomen: soft/ND/ND, positive bowel sounds Skin: no rash Musculoskeletal:  Bilateral pitting edema, report chronic Psychiatric: calm/cooperative Neurologic: no focal findings            Labs on Admission:  Basic Metabolic Panel: Recent Labs  Lab 08/11/17 1414  NA 144  K 4.1  CO2 25  GLUCOSE 100  BUN 42.1*  CREATININE 1.4*  CALCIUM 8.0*   Liver Function Tests: Recent Labs  Lab 08/11/17 1414  AST 34  ALT 25  ALKPHOS 109  BILITOT 0.80  PROT 4.8*  ALBUMIN 2.5*   No results for input(s): LIPASE, AMYLASE in the last 168 hours. No results for input(s): AMMONIA in the last 168 hours. CBC: Recent Labs  Lab 08/11/17 1414  WBC 3.6*  NEUTROABS 2.6  HGB 7.0*  HCT 22.2*  MCV 100.0  PLT 73*   Cardiac Enzymes: No results for input(s): CKTOTAL, CKMB, CKMBINDEX, TROPONINI in the last 168 hours.  BNP (last 3 results) Recent Labs    06/13/17 1826  BNP 78.8    ProBNP (last 3  results) No results for input(s): PROBNP in the last 8760 hours.  CBG: No results for input(s): GLUCAP in the last 168 hours.  Radiological Exams on Admission: No results found.    Assessment/Plan Present on Admission: **None**    Recurrent GI bleed from GAVE -EDP to  consult [facility].  patient has multiple hospitalizations recently for blood transfusion.  GI office /hematology office to arrange outpatient transfusion to prevent further rehospitalizations. -patient does want to have transfusions and medical care. Continue ppi / carafate for now.  -there was discussion during previous hospitalization regarding palliative care to follow at SNF, transition to hospice when patient is ready. Will consult palliative care here.  [name] cirrhosis with chronic thrombocytopenia -With prior hepatic encephalopathy, on lactulose, xifaximin.  continue - her platelets seem relatively stable, close to baseline  Insulin dependent Type 2 diabetes mellitus -continue Lantus and meal coverage insulin.  Chronic kidney disease stage III -Creatinine appears close to baseline, monitor in the morning  Chronic diastolic CHF -some bilateral pitting edema, report chronic, continue home meds demadex  COPD with chronic hypoxic respiratory failure on home o2 -Stable, no wheezing, continue home medications and oxygen supplement   DVT prophylaxis:SCDs Code Status:DNR Family Communication:daughter at bedside Disposition Plan: return to SNF , likely on 12/18 with palliative care to continue following   Consultants:  EDP to call [facility] Palliative care   Disposition Plan: med surg obs  Time spent: 54mins  Florencia Reasons MD, PhD [facility] Pager [phone] If 7PM-7AM, please contact night-coverage at [email or web address], password [facility]

## 2017-08-11 NOTE — ED Notes (Signed)
Bed assigned @ 2238 rm 1330

## 2017-08-11 NOTE — ED Provider Notes (Signed)
Pt presents with recurrent anemia associated with her GAVE syndrome.  Outpatient labs showed that her hgb was down to 7.0.  Decreased from 10 on 12/7.  She was referred to the ED for admission. Plan on blood transfusion, admission for further treatment.  Medical screening examination/treatment/procedure(s) were conducted as a shared visit with non-physician practitioner(s) and myself.  I personally evaluated the patient during the encounter.     Dorie Rank, MD 08/11/17 [phone]

## 2017-08-11 NOTE — Progress Notes (Signed)
HEMATOLOGY/ONCOLOGY CONSULTATION NOTE  Date of Service: 08/11/2017  Patient Care Team: Hendricks Limes, MD as PCP - General (Internal Medicine)  CHIEF COMPLAINTS/PURPOSE OF CONSULTATION:  Anemia requiring frequent transfusions with thrombocytopenia   HISTORY OF PRESENTING ILLNESS:   [name] is a wonderful 76 y.o. female who has been referred to [place] by Dr Linna Darner, Darrick Penna, MD for evaluation and management of IDA. Of note, she currently lives in [facility] assisted living facility. She presents with her two daughters. She has a h/o advanced liver cirrhosis, GAVE, CHF, DMII, and CAD among other chronic medical conditions listed below.   The patient currently has GAVE and has had issues with bleeding. She underwent argon beam ablation on Oct 24th, 2018 with Dr Havery Moros and she has continued to have issues with anemia and bleeding since then. She is not currently a candidate for another ablation. He has referred her our care at this point as he feels like he has exhausted all of his current options. She is currently on ProCrit which is given at her nursing home and ordered by Dr Linna Darner.   Over the past several months since around the end of September, she has been admitted into the hospital for symptomatic anemia and gastrointestinal hemorrhage seven separate times. She was last admitted for this on 07/31/17 and given 2u of PRBC, Hgb was correct to 10.1 from 6.6 at that time.   Symptomatically on ROS, she reports that she has been intermittently lightheaded and dizzy. She also notes shortness of breath which is somewhat worsened from her baseline and worse with activity. She has been experiencing melena almost with every bowel movement. She also reports some pain and weakness into her lower extremities. She reports occasional abdominal which extends into her back along the epigastric, RUQ, and LUQ regions. Pt denies fever, chills, rash, mouth sores, weight loss, decreased appetite,  urinary complaints. Pt denies nausea, vomiting. She currently ambulates with a walker at baseline. Pertinent positives are otherwise listed within the above HPI.   MEDICAL HISTORY:  Past Medical History:  Diagnosis Date  . Adenomatous colon polyp   . Anxiety   . Asthma   . Back pain   . Benign neoplasm of colon   . CAP (community acquired pneumonia) 2015  . Carpal tunnel syndrome on both sides   . Chronic airway obstruction, not elsewhere classified   . Chronic diastolic CHF (congestive heart failure) ([place])    a. 03/26/2014 Echo: EF 55-60%, no rwma, mild AS/AI, mod-sev Ca2+ MV annulus, mildly to mod dil LA.  [name] Coarse tremors    a. arms.  . Diverticulosis of colon (without mention of hemorrhage)   . Esophageal reflux   . Falls frequently    completed HHPT/OT 06/2014, PT unmet goals  . GAVE (gastric antral vascular ectasia)    angiodysplasia; s/p multiple APCs, discussing RFA with [facility] Dr Newman Pies (06/2015)  . H/O hiatal hernia   . Hepatitis   . Hiatal hernia   . Hyperlipidemia   . Iron deficiency anemia secondary to blood loss (chronic)    a. frequent PRBC transfusions.  . Liver cirrhosis secondary to nonalcoholic steatohepatitis (NASH) ([place])    a. dx'd 1990's  . Major depressive disorder, recurrent episode, severe, specified as with psychotic behavior   . Memory loss   . Midsternal chest pain    a. conservatively managed ->poor candidate for cath/anticoagulation.  . Mild aortic stenosis    a. 03/2014 Valve area (VTI): 2.89 cm^2,  Valve area (Vmax): 2.7 cm^2.  . Morbid obesity ([place])   . Neck pain   . Non-obstructive CAD   . On home oxygen therapy    "3L; 24/7" (11/29/2015)  . OSA (obstructive sleep apnea)    a. does not use CPAP. (01/25/2015)  . Osteoarthrosis, unspecified whether generalized or localized, unspecified site   . Osteoporosis, unspecified   . Palpitations   . Portal hypertensive gastropathy ([place])   . Protein calorie malnutrition ([place])   . Recurrent UTI  (urinary tract infection)    h/o hospitalization with urosepsis 2015, but thought large component colonization/bacteriuria, only treat if symptomatic ([place])  . Restless legs syndrome (RLS)   . Schizophrenia ([place])   . Thrombocytopenia ([place])   . Type II diabetes mellitus ([place])   . Unspecified chronic bronchitis ([place])   . Unspecified essential hypertension     SURGICAL HISTORY: Past Surgical History:  Procedure Laterality Date  . APPENDECTOMY    . BALLOON DILATION  06/12/2012   Procedure: BALLOON DILATION;  Surgeon: Lafayette Dragon, MD;  Location: [facility] ENDOSCOPY;  Service: Endoscopy;  Laterality: N/A;  ?balloon  . BREAST BIOPSY     bilateral  . CESAREAN SECTION     x 3  . ENTEROSCOPY N/A 02/25/2015   Procedure: ENTEROSCOPY;  Surgeon: [name] Ada, MD;  Location: [facility] ENDOSCOPY;  Service: Endoscopy;  Laterality: N/A;  . ENTEROSCOPY N/A 06/27/2015   Procedure: ENTEROSCOPY;  Surgeon: Manus Gunning, MD;  Location: [facility] ENDOSCOPY;  Service: Gastroenterology;  Laterality: N/A;  . ESOPHAGOGASTRODUODENOSCOPY  06/12/2012   Procedure: ESOPHAGOGASTRODUODENOSCOPY (EGD);  Surgeon: Lafayette Dragon, MD;  Location: [facility];  Service: Endoscopy;  Laterality: N/A;  . ESOPHAGOGASTRODUODENOSCOPY  09/10/2012   Procedure: ESOPHAGOGASTRODUODENOSCOPY (EGD);  Surgeon: Lafayette Dragon, MD;  Location: [facility];  Service: Endoscopy;  Laterality: N/A;  . ESOPHAGOGASTRODUODENOSCOPY N/A 01/18/2013   Procedure: ESOPHAGOGASTRODUODENOSCOPY (EGD);  Surgeon: Lafayette Dragon, MD;  Location[facility] END[facility]COPY;  Service: Endoscopy;  Laterality: N/A;  . ESOPHAGOGASTRODUODENOSCOPY N/A 05/24/2013   Procedure: ESOPHAGOGASTRODUODENOSCOPY (EGD);  Surgeon: Jerene Bears, MD;  Location: [place];  Service: Gastroenterology;  Laterality: N/A;  . ESOPHAGOGASTRODUODENOSCOPY N/A 10/20/2014   Procedure: ESOPHAGOGASTRODUODENOSCOPY (EGD);  Surgeon: Lafayette Dragon, MD;  Location: [facility];  Service: Endoscopy;  Laterality: N/A;  .  ESOPHAGOGASTRODUODENOSCOPY N/A 06/18/2017   Procedure: ESOPHAGOGASTRODUODENOSCOPY (EGD) with RFA;  Surgeon: Yetta Flock, MD;  Location: [facility];  Service: Gastroenterology;  Laterality: N/A;  . EUS  09/2015   antral CAVL, ablated; nodular erosive gastritis; mult pancreatic cysts rec rpt CT pancreas protocol or MRI to survey pancreas cysts 1-2 yrs (Dr Amie Critchley)  . HEMORROIDECTOMY    . HOT HEMOSTASIS  09/10/2012   Procedure: HOT HEMOSTASIS (ARGON PLASMA COAGULATION/BICAP);  Surgeon: Lafayette Dragon, MD;  Location: [facility];  Service: Endoscopy;  Laterality: N/A;  . HOT HEMOSTASIS N/A 05/24/2013   Procedure: HOT HEMOSTASIS (ARGON PLASMA COAGULATION/BICAP);  Surgeon: Jerene Bears, MD;  Location: [place];  Service: Gastroenterology;  Laterality: N/A;  . HOT HEMOSTASIS N/A 10/20/2014   Procedure: HOT HEMOSTASIS (ARGON PLASMA COAGULATION/BICAP);  Surgeon: Lafayette Dragon, MD;  Location: [facility];  Service: Endoscopy;  Laterality: N/A;  . US ECHOCARDIOGRAPHY  07/2014   mild LVH, EF 60-65%, normal wall motion, mild AR, mod dilated LA, mildly dilated RA, peak PA pressure 65mHg  . VAGINAL HYSTERECTOMY      SOCIAL HISTORY: Social History   Socioeconomic History  . Marital status: Married    Spouse name:  jerry Meissner  . Number of children: 4  . Years of education: 41  . Highest education level: Not on file  Social Needs  . Financial resource strain: Not on file  . Food insecurity - worry: Not on file  . Food insecurity - inability: Not on file  . Transportation needs - medical: Not on file  . Transportation needs - non-medical: Not on file  Occupational History  . Occupation: Disabled    Employer: RETIRED  Tobacco Use  . Smoking status: Former Smoker    Packs/day: 1.50    Years: 25.00    Pack years: 37.50    Types: Cigarettes    Last attempt to quit: 08/26/1992    Years since quitting: 24.9  . Smokeless tobacco: Never Used  Substance and Sexual Activity  . Alcohol  use: No    Alcohol/week: 0.0 oz    Comment: 01/19/77 "last alcoholic drink was years ago"  . Drug use: No  . Sexual activity: No  Other Topics Concern  . Not on file  Social History Narrative   Patient lives at [facility]. Will be living at [facility] , living in [place], near [facility] as of 06/18/17.   Retired.   Caffeine- None    Right handed.      Dr. Olevia Perches now [facility] (referred to [facility])   Dr. Burt Knack cards    Dr. Loanne Drilling endo   Dr Lenna Gilford pulm   Psych: Followed by [facility] (ph [phone], fax [phone]).     FAMILY HISTORY: Family History  Problem Relation Age of Onset  . Heart disease Mother   . Cervical cancer Mother   . Kidney disease Mother   . Diabetes Mother   . Heart attack Mother   . Diabetes Father   . Heart attack Father   . Multiple sclerosis Other   . Breast cancer Sister   . Multiple sclerosis Sister   . Breast cancer Maternal Aunt        x 2  . Diabetes Sister   . Stroke Daughter   . Stroke Daughter   . Lupus Daughter   . Colon cancer Neg Hx     ALLERGIES:  is allergic to acetaminophen; nsaids; aspirin; theophylline; penicillin g; and penicillins.  MEDICATIONS:  Current Outpatient Medications  Medication Sig Dispense Refill  . ADVAIR DISKUS 250-50 MCG/DOSE AEPB INHALE 1 PUFF INTO LUNGS TWICE A DAY 60 each 11  . albuterol (PROVENTIL) (2.5 MG/3ML) 0.083% nebulizer solution Take 2.5 mg by nebulization every 6 (six) hours as needed for wheezing or shortness of breath.    . ALPRAZolam (XANAX) 0.5 MG tablet Take 1 tablet (0.5 mg total) by mouth 2 (two) times daily as needed for anxiety. 5 tablet 0  . atorvastatin (LIPITOR) 20 MG tablet Take 20 mg by mouth daily.     [name] buPROPion (WELLBUTRIN XL) 150 MG 24 hr tablet Take 150 mg by mouth daily.    . Cholecalciferol 1000 units tablet Take 1,000 Units by mouth daily.    . Darbepoetin Alfa (ARANESP) 100 MCG/0.5ML SOSY injection Inject 100 mcg every 14 (fourteen)  days into the skin.    [name] dicyclomine (BENTYL) 20 MG tablet Take 1 tablet (20 mg total) by mouth 2 (two) times daily. 20 tablet 0  . Insulin Glargine (LANTUS SOLOSTAR) 100 UNIT/ML Solostar Pen Inject 60 Units into the skin daily at 10 pm.     . insulin lispro (HUMALOG KWIKPEN) 100 UNIT/ML KiwkPen Inject 3  Units into the skin daily. Before breakfast    . iron polysaccharides (NIFEREX) 150 MG capsule Take 150 mg by mouth 3 (three) times daily with meals.    . isosorbide mononitrate (IMDUR) 30 MG 24 hr tablet Take 1 tablet (30 mg total) by mouth daily. 30 tablet 6  . lactulose (CHRONULAC) 10 GM/15ML solution Take 10 g by mouth 4 (four) times daily.    [name] levalbuterol (XOPENEX) 0.63 MG/3ML nebulizer solution Take 0.63 mg by nebulization 2 (two) times daily.   5  . Melatonin 3 MG TABS Take 3 mg by mouth at bedtime.    . Multiple Vitamin (MULTIVITAMIN WITH MINERALS) TABS tablet Take 1 tablet by mouth daily.     . nitroGLYCERIN (NITROSTAT) 0.4 MG SL tablet Place 1 tablet (0.4 mg total) under the tongue every 5 (five) minutes as needed for chest pain. No more than 3 in 15 minutes 25 tablet 1  . oxyCODONE (OXY IR/ROXICODONE) 5 MG immediate release tablet Take 1 tablet (5 mg total) by mouth every 6 (six) hours as needed for moderate pain or severe pain. 5 tablet 0  . OXYGEN Inhale 2 L into the lungs continuous.     . pantoprazole (PROTONIX) 20 MG tablet Take 20 mg by mouth daily.     . potassium chloride SA (K-DUR,KLOR-CON) 20 MEQ tablet Take 20 mEq by mouth daily.    . promethazine (PHENERGAN) 12.5 MG tablet Take 12.5 mg by mouth every 12 (twelve) hours.    . Propylene Glycol (SYSTANE BALANCE) 0.6 % SOLN Place 1 drop into both eyes every morning.    [name] QUEtiapine (SEROQUEL) 100 MG tablet Take 100 mg by mouth at bedtime.    . rifaximin (XIFAXAN) 550 MG TABS tablet Take 550 mg by mouth 2 (two) times daily.    [name] rOPINIRole (REQUIP) 4 MG tablet Take 4 mg by mouth at bedtime.    . Skin Protectants, Misc.  (EUCERIN) cream Apply 1 application daily topically. Apply to nasal septum bilaterally to prevent epistaxis from oxygen-induced drying     . sucralfate (CARAFATE) 1 GM/10ML suspension Take 1 g by mouth every 6 (six) hours as needed (ulcer).     . torsemide (DEMADEX) 20 MG tablet Take 40 mg by mouth daily.    . traMADol (ULTRAM) 50 MG tablet Take 1 tablet (50 mg total) by mouth every 12 (twelve) hours as needed for moderate pain. Not to be given with Xanax. 5 tablet 0  . valproic acid (DEPAKENE) 250 MG capsule Take 250 mg by mouth 2 (two) times daily.     No current facility-administered medications for this visit.     REVIEW OF SYSTEMS:    A 10+ POINT REVIEW OF SYSTEMS WAS OBTAINED including neurology, dermatology, psychiatry, cardiac, respiratory, lymph, extremities, GI, GU, Musculoskeletal, constitutional, breasts, reproductive, HEENT.  All pertinent positives are noted in the HPI.  All others are negative.  PHYSICAL EXAMINATION: ECOG PERFORMANCE STATUS: 2 - Symptomatic, <50% confined to bed  . Vitals:   08/11/17 1150  BP: (!) 148/64  Pulse: 69  Resp: 20  Temp: 98.9 F (37.2 C)  SpO2: 98%   Filed Weights   08/11/17 1150  Weight: 181 lb 4.8 oz (82.2 kg)   .Body mass index is 32.63 kg/m.  GENERAL:alert, in no acute distress and comfortable in a wheelchair. Nasal canula is in place and administering oxygen at appropriate amount of 2L/min.  SKIN: no acute rashes, no significant lesions. Pallor is noted diffusely.  EYES: Right  conjunctiva is pink and non-injected, sclera anicteric. Subconjunctival hemorrhage is noted within the left eye.  OROPHARYNX: MMM, no exudates, no oropharyngeal erythema or ulceration NECK: supple, no JVD LYMPH:  no palpable lymphadenopathy in the cervical, axillary or inguinal regions LUNGS: clear to auscultation b/l with normal respiratory effort HEART: regular rate & rhythm ABDOMEN:  normoactive bowel sounds , non tender, not distended. Extremity: no  pedal edema PSYCH: alert & oriented x 3 with fluent speech NEURO: no focal motor/sensory deficits  LABORATORY DATA:  I have reviewed the data as listed  Component     Latest Ref Rng & Units 08/11/2017  WBC     3.9 - 10.3 10e3/uL 3.6 (L)  NEUT#     1.5 - 6.5 10e3/uL 2.6  Hemoglobin     11.6 - 15.9 g/dL 7.0 (L)  HCT     34.8 - 46.6 % 22.2 (L)  Platelets     145 - 400 10e3/uL 73 (L)  MCV     79.5 - 101.0 fL 100.0  MCH     25.1 - 34.0 pg 31.5  MCHC     31.5 - 36.0 g/dL 31.5  RBC     3.70 - 5.45 10e6/uL 2.22 (L)  RDW     11.2 - 14.5 % 19.7 (H)  lymph#     0.9 - 3.3 10e3/uL 0.5 (L)  MONO#     0.1 - 0.9 10e3/uL 0.3  Eosinophils Absolute     0.0 - 0.5 10e3/uL 0.2  Basophils Absolute     0.0 - 0.1 10e3/uL 0.0  NEUT%     38.4 - 76.8 % 72.4  LYMPH%     14.0 - 49.7 % 12.6 (L)  MONO%     0.0 - 14.0 % 9.6  EOS%     0.0 - 7.0 % 4.8  BASO%     0.0 - 2.0 % 0.6  Retic %     0.70 - 2.10 % 7.00 (H)  Retic Ct Abs     33.70 - 90.70 10e3/uL 155.40 (H)  Immature Retic Fract     1.60 - 10.00 % 13.40 (H)  Sodium     136 - 145 mEq/L 144  Potassium     3.5 - 5.1 mEq/L 4.1  Chloride     98 - 109 mEq/L 112 (H)  CO2     22 - 29 mEq/L 25  Glucose     70 - 140 mg/dl 100  BUN     7.0 - 26.0 mg/dL 42.1 (H)  Creatinine     0.6 - 1.1 mg/dL 1.4 (H)  Total Bilirubin     0.20 - 1.20 mg/dL 0.80  Alkaline Phosphatase     40 - 150 U/L 109  AST     5 - 34 U/L 34  ALT     0 - 55 U/L 25  Total Protein     6.4 - 8.3 g/dL 4.8 (L)  Albumin     3.5 - 5.0 g/dL 2.5 (L)  Calcium     8.4 - 10.4 mg/dL 8.0 (L)  Anion gap     3 - 11 mEq/L 7  EGFR     >60 ml/min/1.73 m2 38 (L)  Protime     10.6 - 13.4 Seconds 14.4 (H)  INR     2.00 - 3.50 1.20 (L)  Lovenox      No  Ferritin     9 - 269 ng/ml 219     RADIOGRAPHIC  STUDIES: I have personally reviewed the radiological images as listed and agreed with the findings in the report. No results found.  ASSESSMENT & PLAN:  This is a  very pleasant caucasian 76 y.o. female who presents into the clinic today to discuss:   1.) Symptomatic anemia with thrombocytopenia. -Her anemia is primarily caused by her GI bleeding from her GAVE, in combination with splenomegaly and liver cirrhosis.  Thrombocytopenia and mild coagulopathy from cirrhosis worsening GI bleeding. 2) Thrombocytopenia Platelet 73k -There is likely some platelet sequestration secondary to her splenomegaly which is likely why she has some issues with thrombocytopenia.  PLAN -She was referred to [place] to help form more of an outpatient type of management for her care. I did have a lengthy discussion with her daughters and the patient about the unpredictability of her GI bleeding and resultant anemia that appears to be too brisk to allow an outpatient monitoring and PRBC transfusion strategy. -hgb today down to 7 and patient is symptomatic and having active melena -- sent to ED for admission for GI bleeding and PRBC transfusion. -With her next labs we will evaluate for a coagulopathy as well. -I did offer option to try using Lysteda. I discussed at great length the side effects with taking this medication and how it puts her at an increased risk for thrombotic events. In the absence of other options she is agreeable with trying this and a prescription was given. -if active bleeding might need to consider GI bleed study and possible consideration of directed IR angio-embolization. -if Platelets < 50k could consider Nplate based on goals of care. -Currently, her goal is to make it past the holidays and following that she does not have a goal of care. She is currently DNR/DNI.  -They are currently in talks with palliative care & hospice. The patient is currently considering her options with this.   Labs today - sent to ED for admission and PRBC transfusion. Labs q2weeks Schedule for PRBC transfusion q2weeks x 6 RTC with Dr Irene Limbo in 4 weeks   All of the patients questions  were answered with apparent satisfaction. The patient knows to call the clinic with any problems, questions or concerns.  I spent 45 minutes counseling the patient face to face. The total time spent in the appointment was 60 minutes and more than 50% was on counseling and direct patient cares.  Sullivan Lone MD MS AAHIVMS [facility] Hematology/Oncology Physician [facility]  (Office):       [phone] (Work cell):  [phone] (Fax):           [phone]  08/11/2017 12:36 PM  This document serves as a record of services personally performed by Sullivan Lone, MD. It was created on his behalf by Reola Mosher, a trained medical scribe. The creation of this record is based on the scribe's personal observations and the provider's statements to them.   .I have reviewed the above documentation for accuracy and completeness, and I agree with the above. Brunetta Genera MD MS

## 2017-08-12 ENCOUNTER — Telehealth: Payer: Self-pay | Admitting: Gastroenterology

## 2017-08-12 ENCOUNTER — Ambulatory Visit: Payer: Medicare Other | Admitting: Gastroenterology

## 2017-08-12 DIAGNOSIS — D500 Iron deficiency anemia secondary to blood loss (chronic): Secondary | ICD-10-CM

## 2017-08-12 DIAGNOSIS — N183 Chronic kidney disease, stage 3 (moderate): Secondary | ICD-10-CM | POA: Diagnosis not present

## 2017-08-12 DIAGNOSIS — K31819 Angiodysplasia of stomach and duodenum without bleeding: Secondary | ICD-10-CM | POA: Diagnosis not present

## 2017-08-12 DIAGNOSIS — E119 Type 2 diabetes mellitus without complications: Secondary | ICD-10-CM | POA: Diagnosis not present

## 2017-08-12 DIAGNOSIS — Z515 Encounter for palliative care: Secondary | ICD-10-CM | POA: Diagnosis not present

## 2017-08-12 DIAGNOSIS — D649 Anemia, unspecified: Secondary | ICD-10-CM | POA: Diagnosis not present

## 2017-08-12 DIAGNOSIS — Z7189 Other specified counseling: Secondary | ICD-10-CM

## 2017-08-12 DIAGNOSIS — I5032 Chronic diastolic (congestive) heart failure: Secondary | ICD-10-CM | POA: Diagnosis not present

## 2017-08-12 DIAGNOSIS — K922 Gastrointestinal hemorrhage, unspecified: Secondary | ICD-10-CM | POA: Diagnosis not present

## 2017-08-12 DIAGNOSIS — K7460 Unspecified cirrhosis of liver: Secondary | ICD-10-CM | POA: Diagnosis not present

## 2017-08-12 LAB — CBC
HEMATOCRIT: 23.5 % — AB (ref 36.0–46.0)
HEMOGLOBIN: 7.9 g/dL — AB (ref 12.0–15.0)
MCH: 32.8 pg (ref 26.0–34.0)
MCHC: 33.6 g/dL (ref 30.0–36.0)
MCV: 97.5 fL (ref 78.0–100.0)
Platelets: 62 10*3/uL — ABNORMAL LOW (ref 150–400)
RBC: 2.41 MIL/uL — ABNORMAL LOW (ref 3.87–5.11)
RDW: 19 % — AB (ref 11.5–15.5)
WBC: 2.9 10*3/uL — ABNORMAL LOW (ref 4.0–10.5)

## 2017-08-12 LAB — BASIC METABOLIC PANEL
Anion gap: 5 (ref 5–15)
BUN: 40 mg/dL — ABNORMAL HIGH (ref 6–20)
CALCIUM: 8 mg/dL — AB (ref 8.9–10.3)
CHLORIDE: 113 mmol/L — AB (ref 101–111)
CO2: 25 mmol/L (ref 22–32)
CREATININE: 1.33 mg/dL — AB (ref 0.44–1.00)
GFR calc non Af Amer: 38 mL/min — ABNORMAL LOW (ref >60)
GFR, EST AFRICAN AMERICAN: 44 mL/min — AB (ref >60)
GLUCOSE: 199 mg/dL — AB (ref 65–99)
Potassium: 3.8 mmol/L (ref 3.5–5.1)
Sodium: 143 mmol/L (ref 135–145)

## 2017-08-12 LAB — PREPARE RBC (CROSSMATCH)

## 2017-08-12 LAB — GLUCOSE, CAPILLARY: Glucose-Capillary: 129 mg/dL — ABNORMAL HIGH (ref 65–99)

## 2017-08-12 MED ORDER — FUROSEMIDE 10 MG/ML IJ SOLN
60.0000 mg | Freq: Once | INTRAMUSCULAR | Status: AC
  Administered 2017-08-12: 60 mg via INTRAVENOUS
  Filled 2017-08-12: qty 6

## 2017-08-12 MED ORDER — PANTOPRAZOLE SODIUM 20 MG PO TBEC
20.0000 mg | DELAYED_RELEASE_TABLET | Freq: Two times a day (BID) | ORAL | Status: DC
  Administered 2017-08-13: 20 mg via ORAL
  Filled 2017-08-12 (×3): qty 1

## 2017-08-12 MED ORDER — OXYCODONE HCL 5 MG PO TABS: 5.0000 mg | ORAL_TABLET | Freq: Four times a day (QID) | ORAL | Status: DC | PRN

## 2017-08-12 MED ORDER — FUROSEMIDE 10 MG/ML IJ SOLN: 40.0000 mg | Freq: Once | INTRAMUSCULAR | Status: DC

## 2017-08-12 MED ORDER — SODIUM CHLORIDE 0.9 % IV SOLN: Freq: Once | INTRAVENOUS | Status: DC

## 2017-08-12 MED ORDER — IPRATROPIUM BROMIDE 0.02 % IN SOLN
0.5000 mg | Freq: Two times a day (BID) | RESPIRATORY_TRACT | Status: DC
  Administered 2017-08-12 – 2017-08-13 (×2): 0.5 mg via RESPIRATORY_TRACT
  Filled 2017-08-12 (×2): qty 2.5

## 2017-08-12 MED ORDER — IPRATROPIUM-ALBUTEROL 0.5-2.5 (3) MG/3ML IN SOLN: 3.0000 mL | Freq: Once | RESPIRATORY_TRACT | Status: DC

## 2017-08-12 NOTE — Care Management Obs Status (Signed)
[name] NOTIFICATION   Patient Details  Name: DARSHANA CURNUTT MRN: [id number] Date of Birth: [birth year]   Medicare Observation Status Notification Given:  Yes Pt only able to sign with "x"   Lynnell Catalan, RN 08/12/2017, 1:11 PM

## 2017-08-12 NOTE — Consult Note (Signed)
Consultation  Referring Provider:   Dr. Erlinda Hong   Primary Care Physician:  Hendricks Limes, MD Primary Gastroenterologist: Dr. Havery Moros         Reason for Consultation:   GAVE, Chronic Blood loss anemia           HPI:   [name] is a 76 y.o. 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  Patient has extensive history that has been reviewed by Dr. Havery Moros in the past, please see his note 06/17/17 for further details.  We were consulted again in regards to our final recommendations for this patient and "assistance with ongoing blood transfusions as an outpatient".    At time of interview today the patient is lethargic and does not respond to questioning, she only mutters and moves slightly with a sternal rub.  Per review of previous physician's notes patient was sent from her hematology office after being seen as an outpatient for hemoglobin of 7, dropped from 10 tested 10 days ago.  The patient received 2 units of PRBCs in the ER.  Previous Gi History: 06/18/17- EGD, Dr. Havery Moros: Impression: Esophagogastric landmarks identified, normal esophagus, severe gastric antral vascular ectasia of the distal stomach treated with radiofrequency ablation, normal duodenal bulb and second portion of the duodenum   Past Medical History:  Diagnosis Date  . Adenomatous colon polyp   . Anxiety   . Asthma   . Back pain   . Benign neoplasm of colon   . CAP (community acquired pneumonia) 2015  . Carpal tunnel syndrome on both sides   . Chronic airway obstruction, not elsewhere classified   . Chronic diastolic CHF (congestive heart failure) ([place])    a. 03/26/2014 Echo: EF 55-60%, no rwma, mild AS/AI, mod-sev Ca2+ MV annulus, mildly to mod dil LA.  [name] Coarse tremors    a. arms.  . Diverticulosis of colon (without mention  of hemorrhage)   . Esophageal reflux   . Falls frequently    completed HHPT/OT 06/2014, PT unmet goals  . GAVE (gastric antral vascular ectasia)    angiodysplasia; s/p multiple APCs, discussing RFA with [facility] GI Dr Newman Pies (06/2015)  . H/O hiatal hernia   . Hepatitis   . Hiatal hernia   . Hyperlipidemia   . Iron deficiency anemia secondary to blood loss (chronic)    a. frequent PRBC transfusions.  . Liver cirrhosis secondary to nonalcoholic steatohepatitis (NASH) ([place])    a. dx'd 1990's  . Major depressive disorder, recurrent episode, severe, specified as with psychotic behavior   . Memory loss   . Midsternal chest pain    a. conservatively managed ->poor candidate for cath/anticoagulation.  . Mild aortic stenosis    a. 03/2014 Valve area (VTI): 2.89 cm^2, Valve area (Vmax): 2.7 cm^2.  . Morbid obesity ([place])   . Neck pain   . Non-obstructive CAD   . On home oxygen therapy    "3L; 24/7" (11/29/2015)  . OSA (obstructive sleep apnea)    a. does not use CPAP. (01/25/2015)  . Osteoarthrosis, unspecified whether generalized or localized, unspecified site   . Osteoporosis, unspecified   . Palpitations   . Portal hypertensive gastropathy ([place])   . Protein calorie malnutrition ([place])   . Recurrent UTI (urinary tract infection)    h/o hospitalization with urosepsis 2015, but thought  large component colonization/bacteriuria, only treat if symptomatic ([place])  . Restless legs syndrome (RLS)   . Schizophrenia ([place])   . Thrombocytopenia ([place])   . Type II diabetes mellitus ([place])   . Unspecified chronic bronchitis ([place])   . Unspecified essential hypertension     Past Surgical History:  Procedure Laterality Date  . APPENDECTOMY    . BALLOON DILATION  06/12/2012   Procedure: BALLOON DILATION;  Surgeon: Lafayette Dragon, MD;  Location: [facility] ENDOSCOPY;  Service: Endoscopy;  Laterality: N/A;  ?balloon  . BREAST BIOPSY     bilateral  . CESAREAN SECTION     x 3  . ENTEROSCOPY N/A 02/25/2015    Procedure: ENTEROSCOPY;  Surgeon: [name] Ada, MD;  Location: [facility] ENDOSCOPY;  Service: Endoscopy;  Laterality: N/A;  . ENTEROSCOPY N/A 06/27/2015   Procedure: ENTEROSCOPY;  Surgeon: Manus Gunning, MD;  Location: [facility] ENDOSCOPY;  Service: Gastroenterology;  Laterality: N/A;  . ESOPHAGOGASTRODUODENOSCOPY  06/12/2012   Procedure: ESOPHAGOGASTRODUODENOSCOPY (EGD);  Surgeon: Lafayette Dragon, MD;  Location: [facility];  Service: Endoscopy;  Laterality: N/A;  . ESOPHAGOGASTRODUODENOSCOPY  09/10/2012   Procedure: ESOPHAGOGASTRODUODENOSCOPY (EGD);  Surgeon: Lafayette Dragon, MD;  Location: [facility];  Service: Endoscopy;  Laterality: N/A;  . ESOPHAGOGASTRODUODENOSCOPY N/A 01/18/2013   Procedure: ESOPHAGOGASTRODUODENOSCOPY (EGD);  Surgeon: Lafayette Dragon, MD;  Location: [facility] ENDOSCOPY;  Service: Endoscopy;  Laterality: N/A;  . ESOPHAGOGASTRODUODENOSCOPY N/A 05/24/2013   Procedure: ESOPHAGOGASTRODUODENOSCOPY (EGD);  Surgeon: Jerene Bears, MD;  Location: [place];  Service: Gastroenterology;  Laterality: N/A;  . ESOPHAGOGASTRODUODENOSCOPY N/A 10/20/2014   Procedure: ESOPHAGOGASTRODUODENOSCOPY (EGD);  Surgeon: Lafayette Dragon, MD;  Location: [facility];  Service: Endoscopy;  Laterality: N/A;  . ESOPHAGOGASTRODUODENOSCOPY N/A 06/18/2017   Procedure: ESOPHAGOGASTRODUODENOSCOPY (EGD) with RFA;  Surgeon: Yetta Flock, MD;  Location: [facility] ENDOSCOPY;  Service: Gastroenterology;  Laterality: N/A;  . EUS  09/2015   antral CAVL, ablated; nodular erosive gastritis; mult pancreatic cysts rec rpt CT pancreas protocol or MRI to survey pancreas cysts 1-2 yrs (Dr Amie Critchley)  . HEMORROIDECTOMY    . HOT HEMOSTASIS  09/10/2012   Procedure: HOT HEMOSTASIS (ARGON PLASMA COAGULATION/BICAP);  Surgeon: Lafayette Dragon, MD;  Location: [facility];  Service: Endoscopy;  Laterality: N/A;  . HOT HEMOSTASIS N/A 05/24/2013   Procedure: HOT HEMOSTASIS (ARGON PLASMA COAGULATION/BICAP);  Surgeon: Jerene Bears, MD;  Location: [place];  Service: Gastroenterology;  Laterality: N/A;  . HOT HEMOSTASIS N/A 10/20/2014   Procedure: HOT HEMOSTASIS (ARGON PLASMA COAGULATION/BICAP);  Surgeon: Lafayette Dragon, MD;  Location: [facility];  Service: Endoscopy;  Laterality: N/A;  . US ECHOCARDIOGRAPHY  07/2014   mild LVH, EF 60-65%, normal wall motion, mild AR, mod dilated LA, mildly dilated RA, peak PA pressure 18mmHg  . VAGINAL HYSTERECTOMY      Family History  Problem Relation Age of Onset  . Heart disease Mother   . Cervical cancer Mother   . Kidney disease Mother   . Diabetes Mother   . Heart attack Mother   . Diabetes Father   . Heart attack Father   . Multiple sclerosis Other   . Breast cancer Sister   . Multiple sclerosis Sister   . Breast cancer Maternal Aunt        x 2  . Diabetes Sister   . Stroke Daughter   . Stroke Daughter   . Lupus Daughter   . Colon cancer Neg Hx      Social History   Tobacco Use  .  Smoking status: Former Smoker    Packs/day: 1.50    Years: 25.00    Pack years: 37.50    Types: Cigarettes    Last attempt to quit: 08/26/1992    Years since quitting: 24.9  . Smokeless tobacco: Never Used  Substance Use Topics  . Alcohol use: No    Alcohol/week: 0.0 oz    Comment: 02/18/93 "last alcoholic drink was years ago"  . Drug use: No    Prior to Admission medications   Medication Sig Start Date End Date Taking? Authorizing Provider  ADVAIR DISKUS 250-50 MCG/DOSE AEPB INHALE 1 PUFF INTO LUNGS TWICE A DAY 05/29/15  Yes Ria Bush, MD  albuterol (PROVENTIL) (2.5 MG/3ML) 0.083% nebulizer solution Take 2.5 mg by nebulization every 6 (six) hours as needed for wheezing or shortness of breath.   Yes [provider]  ALPRAZolam (XANAX) 0.5 MG tablet Take 1 tablet (0.5 mg total) by mouth 2 (two) times daily as needed for anxiety. 07/16/17  Yes Florencia Reasons, MD  atorvastatin (LIPITOR) 20 MG tablet Take 20 mg by mouth daily.    Yes [provider]  bisacodyl (BISACODYL  LAXATIVE) 10 MG suppository Place 10 mg rectally as needed for moderate constipation. If no relief from the M. o. M., insert one suppository rectally daily. Do not use if there is renal Failure.   Yes [provider]  buPROPion (WELLBUTRIN XL) 150 MG 24 hr tablet Take 150 mg by mouth daily.   Yes [provider]  Cholecalciferol 1000 units tablet Take 1,000 Units by mouth daily.   Yes [provider]  Darbepoetin Alfa (ARANESP) 100 MCG/0.5ML SOSY injection Inject 100 mcg every 14 (fourteen) days into the skin.   Yes [provider]  dicyclomine (BENTYL) 20 MG tablet Take 1 tablet (20 mg total) by mouth 2 (two) times daily. 04/07/16  Yes Leaphart, Zack Seal, PA-C  Insulin Glargine (LANTUS SOLOSTAR) 100 UNIT/ML Solostar Pen Inject 60 Units into the skin daily at 10 pm.    Yes [provider]  insulin lispro (HUMALOG KWIKPEN) 100 UNIT/ML KiwkPen Inject 3 Units into the skin daily. Before breakfast   Yes [provider]  iron polysaccharides (NIFEREX) 150 MG capsule Take 150 mg by mouth 3 (three) times daily with meals.   Yes [provider]  isosorbide mononitrate (IMDUR) 30 MG 24 hr tablet Take 1 tablet (30 mg total) by mouth daily. 04/01/16  Yes Weaver, Scott T, PA-C  lactulose (CHRONULAC) 10 GM/15ML solution Take 10 g by mouth 4 (four) times daily.   Yes [provider]  levalbuterol (XOPENEX) 0.63 MG/3ML nebulizer solution Take 0.63 mg by nebulization 2 (two) times daily.  06/05/14  Yes [provider]  magnesium hydroxide (MILK OF MAGNESIA) 400 MG/5ML suspension Take 30 mLs by mouth daily as needed for mild constipation. If no BM in 3 days, take 30 mL once daily.   Yes [provider]  Melatonin 3 MG TABS Take 3 mg by mouth at bedtime.   Yes [provider]  Multiple Vitamin (MULTIVITAMIN WITH MINERALS) TABS tablet Take 1 tablet by mouth daily.    Yes [provider]  nitroGLYCERIN (NITROSTAT) 0.4  MG SL tablet Place 1 tablet (0.4 mg total) under the tongue every 5 (five) minutes as needed for chest pain. No more than 3 in 15 minutes 02/16/16  Yes Sherren Mocha, MD  oxyCODONE (OXY IR/ROXICODONE) 5 MG immediate release tablet Take 1 tablet (5 mg total) by mouth every 6 (  six) hours as needed for moderate pain or severe pain. 07/16/17  Yes Florencia Reasons, MD  OXYGEN Inhale 2 L into the lungs continuous.    Yes [provider]  pantoprazole (PROTONIX) 20 MG tablet Take 20 mg by mouth daily.    Yes [provider]  potassium chloride SA (K-DUR,KLOR-CON) 20 MEQ tablet Take 20 mEq by mouth daily.   Yes [provider]  promethazine (PHENERGAN) 12.5 MG tablet Take 12.5 mg by mouth every 12 (twelve) hours.   Yes [provider]  Propylene Glycol (SYSTANE BALANCE) 0.6 % SOLN Place 1 drop into both eyes every morning.   Yes [provider]  QUEtiapine (SEROQUEL) 100 MG tablet Take 100 mg by mouth at bedtime.   Yes [provider]  rifaximin (XIFAXAN) 550 MG TABS tablet Take 550 mg by mouth 2 (two) times daily.   Yes [provider]  rOPINIRole (REQUIP) 4 MG tablet Take 4 mg by mouth at bedtime.   Yes [provider]  Skin Protectants, Misc. (EUCERIN) cream Apply 1 application daily topically. Apply to nasal septum bilaterally to prevent epistaxis from oxygen-induced drying    Yes [provider]  Sodium Phosphates (RA SALINE ENEMA) 19-7 GM/118ML ENEM Place 1 application rectally as needed. If bisacodyl suppository doesn't work, apply one  Enema rectally daily.   Yes [provider]  sucralfate (CARAFATE) 1 GM/10ML suspension Take 1 g by mouth every 6 (six) hours as needed (ulcer).    Yes [provider]  torsemide (DEMADEX) 20 MG tablet Take 40 mg by mouth daily.   Yes [provider]  traMADol (ULTRAM) 50 MG tablet Take 1 tablet (50 mg total) by mouth every 12 (twelve) hours as needed for moderate pain.  Not to be given with Xanax. 07/16/17  Yes Florencia Reasons, MD  valproic acid (DEPAKENE) 250 MG capsule Take 250 mg by mouth 2 (two) times daily.   Yes [provider]  tranexamic acid (LYSTEDA) 650 MG TABS tablet Take 2 tablets (1,300 mg total) by mouth 2 (two) times daily. 08/11/17   Brunetta Genera, MD    Current Facility-Administered Medications  Medication Dose Route Frequency Provider Last Rate Last Dose  . albuterol (PROVENTIL) (2.5 MG/3ML) 0.083% nebulizer solution 2.5 mg  2.5 mg Nebulization Q6H PRN Florencia Reasons, MD      . ALPRAZolam Duanne Moron) tablet 0.5 mg  0.5 mg Oral BID PRN Florencia Reasons, MD   0.5 mg at 08/12/17 0450  . atorvastatin (LIPITOR) tablet 20 mg  20 mg Oral QHS Florencia Reasons, MD   20 mg at 08/12/17 0148  . buPROPion (WELLBUTRIN XL) 24 hr tablet 150 mg  150 mg Oral Daily Florencia Reasons, MD      . cholecalciferol (VITAMIN D) tablet 1,000 Units  1,000 Units Oral Daily Florencia Reasons, MD      . insulin glargine (LANTUS) injection 60 Units  60 Units Subcutaneous Q2200 Florencia Reasons, MD   60 Units at 08/12/17 0140  . iron polysaccharides (NIFEREX) capsule 150 mg  150 mg Oral TID WC Florencia Reasons, MD      . isosorbide mononitrate (IMDUR) 24 hr tablet 30 mg  30 mg Oral Daily Florencia Reasons, MD      . lactulose ([place]) 10 GM/15ML solution 10 g  10 g Oral Q6H Florencia Reasons, MD   10 g at 08/12/17 0140  . levalbuterol (XOPENEX) nebulizer solution 0.63 mg  0.63 mg Nebulization BID Florencia Reasons, MD   0.63 mg  at 08/12/17 0820  . magnesium hydroxide (MILK OF MAGNESIA) suspension 30 mL  30 mL Oral Daily PRN Florencia Reasons, MD      . mometasone-formoterol [facility]) 200-5 MCG/ACT inhaler 2 puff  2 puff Inhalation BID Florencia Reasons, MD   2 puff at 08/12/17 [phone]  . multivitamin with minerals tablet 1 tablet  1 tablet Oral Daily Florencia Reasons, MD      . nitroGLYCERIN (NITROSTAT) SL tablet 0.4 mg  0.4 mg Sublingual Q5 min PRN Florencia Reasons, MD      . oxyCODONE (Oxy IR/ROXICODONE) immediate release tablet 5 mg  5 mg Oral Q6H PRN Florencia Reasons, MD      .  pantoprazole (PROTONIX) 80 mg in sodium chloride 0.9 % 250 mL (0.32 mg/mL) infusion  8 mg/hr Intravenous Continuous Margarita Mail, PA-C 25 mL/hr at 08/12/17 0450 8 mg/hr at 08/12/17 0450  . polyvinyl alcohol (LIQUIFILM TEARS) 1.4 % ophthalmic solution 1 drop  1 drop Both Eyes q morning - 10a Florencia Reasons, MD      . promethazine (PHENERGAN) tablet 12.5 mg  12.5 mg Oral Q12H Florencia Reasons, MD   12.5 mg at 08/12/17 0141  . QUEtiapine (SEROQUEL) tablet 100 mg  100 mg Oral QHS Florencia Reasons, MD   100 mg at 08/12/17 0148  . rifaximin (XIFAXAN) tablet 550 mg  550 mg Oral BID Florencia Reasons, MD   550 mg at 08/12/17 0141  . rOPINIRole (REQUIP) tablet 4 mg  4 mg Oral QHS Florencia Reasons, MD   4 mg at 08/12/17 0141  . sucralfate (CARAFATE) 1 GM/10ML suspension 1 g  1 g Oral Q6H PRN Florencia Reasons, MD      . torsemide [facility]) tablet 40 mg  40 mg Oral Daily Florencia Reasons, MD      . traMADol Veatrice Bourbon) tablet 50 mg  50 mg Oral Q12H PRN Florencia Reasons, MD      . valproic acid (DEPAKENE) 250 MG capsule 250 mg  250 mg Oral BID Florencia Reasons, MD   250 mg at 08/12/17 0141    Allergies as of 08/11/2017 - Review Complete 08/11/2017  Allergen Reaction Noted  . Acetaminophen Other (See Comments) 08/11/2014  . Nsaids Other (See Comments) 06/09/2015  . Aspirin Other (See Comments) 12/11/2010  . Theophylline Nausea And Vomiting   . Penicillin g Itching 09/15/2015  . Penicillins Itching and Other (See Comments)      Review of Systems:    Unable to complete due to mental status   Physical Exam:  Vital signs in last 24 hours: Temp:  [97.5 F (36.4 C)-99 F (37.2 C)] 97.5 F (36.4 C) (12/18 0736) Pulse Rate:  [56-69] 58 (12/18 0821) Resp:  [14-20] 16 (12/18 0821) BP: (125-161)/(46-87) 142/50 (12/18 0736) SpO2:  [95 %-100 %] 95 % (12/18 0821) Weight:  [181 lb (82.1 kg)-181 lb 4.8 oz (82.2 kg)] 181 lb (82.1 kg) (12/17 1551) Last BM Date: 08/12/17 General:  Caucasian female appears to be in NAD, Well developed, Well nourished Head:  Normocephalic and  atraumatic. Eyes:   Unable to assess Ears:  Normal auditory acuity. Neck:  Supple Throat: Oral cavity and pharynx without inflammation, swelling or lesion. Lungs: Respirations even and unlabored. Lungs clear to auscultation bilaterally.   No wheezes, crackles, or rhonchi.  Heart: Normal S1, S2. No MRG. Regular rate and rhythm. No peripheral edema, cyanosis or pallor.  Abdomen:  Soft, nondistended, nontender. No rebound or guarding. Normal bowel sounds. No appreciable masses or hepatomegaly. Rectal:  Not performed.  Msk:  Symmetrical without gross deformities. Peripheral pulses intact.  Extremities:  Without edema, no deformity or joint abnormality.  Neurologic:  Lethargic Skin:   Dry and intact without significant lesions or rashes. Psychiatric: Asleep, only mutters and grumbles after sternal rub  LAB RESULTS: Recent Labs    08/11/17 1414  WBC 3.6*  HGB 7.0*  HCT 22.2*  PLT 73*   BMET Recent Labs    08/11/17 1414  NA 144  K 4.1  CO2 25  GLUCOSE 100  BUN 42.1*  CREATININE 1.4*  CALCIUM 8.0*   LFT Recent Labs    08/11/17 1414  PROT 4.8*  ALBUMIN 2.5*  AST 34  ALT 25  ALKPHOS 109  BILITOT 0.80   PT/INR Recent Labs    08/11/17 1414  INR 1.20*   PREVIOUS ENDOSCOPIES:            See HPI   Impression / Plan:   Impression: 1. 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  [name] cirrhosis: With chronic thrombocytopenia, prior hepatic encephalopathy on lactulose and Xifaxan been 3.  COPD with chronic hypoxic respiratory failure on home O2 4.  Chronic diastolic CHF 5.  CKD stage III  Plan: 1. Dr. Carlean Purl will review later today and provide any further/final  recommendations from our team.  Thank you for your kind consultation.  Lavone Nian Lemmon  08/12/2017, 10:09 AM Pager #: [phone]     [facility] Attending   I have taken an interval history, reviewed the chart and examined the patient. I agree with the Advanced Practitioner's note, impression and recommendations.   Multifactorial anemia - GAVE and chronic blood loss likely major component - probably chronic disease anemia also  I have reviewed with my partner Dr. Havery Moros also - we do not think program of ablation of her GAVE is likely to change outcome here. Supportive care seems like best bet and moving to hospice when she is ready.  Discussed w/ Dr. Vilinda Boehringer, MD, [facility] [phone]

## 2017-08-12 NOTE — Progress Notes (Signed)
Palliative Medicine consult noted. Due to high referral volume, there may be a delay seeing this patient. Please call the Palliative Medicine Team office at [phone] if recommendations are needed in the interim.  Thank you for inviting [place] to see this patient.  Marjie Skiff Jaana Brodt, RN, BSN, [facility] 08/12/2017 8:25 AM Cell [phone] 8:00-4:00 Monday-Friday Office [phone]

## 2017-08-12 NOTE — Progress Notes (Signed)
Patient is from [facility] and will return once medically stable. Rhonda at [employer].  CSW will continue to assist with discharge needs.   Kathrin Greathouse, Latanya Presser, MSW Clinical Social Worker  [phone] 08/12/2017  3:22 PM

## 2017-08-12 NOTE — Progress Notes (Signed)
PROGRESS NOTE  [name]:[id number] DOB: Apr 06, 1941 DOA: 08/11/2017 PCP: Hendricks Limes, MD  HPI/Recap of past 24 hours:  hgb increased from 7 to 7.9 after two units of prbc transfusion  Assessment/Plan: Active Problems:   GI bleed   Recurrent GI bleed from GAVE -patient has multiple hospitalizations recently for blood transfusion.  GI office /hematology office to arrange outpatient transfusion to prevent further rehospitalizations. -[facility] consulted who recommend palliative care and transition to hospice when patient is ready. -patient does want to have transfusions and medical care.Continue ppi / carafate . Prbc transfusion prn. (per blood bank, it is difficult to find the right match, blood has to be ordered from [place] )  -palliative care consulted.  Nash cirrhosiswith chronic thrombocytopenia -With prior hepatic encephalopathy, on lactulose,xifaximin.continue - her platelets seem relatively stable, close to baseline  Insulin dependentType 2 diabetes mellitus -continue Lantus and meal coverage insulin.  Chronic kidney disease stage III -Creatinine appears close to baseline, monitor in the morning  Chronic diastolic CHF -some bilateral pitting edema, report chronic, continue home meds demadex Additional lasix 60mg  for wheezing on 12/18 am.  Will give another dose after prbc transfusion today.   COPD with chronic hypoxic respiratory failure on home o2 -she was wheezing this am on 12/18, one dose lasix and additional nebs ordered,  -continue home medicationsand oxygen supplement   DVT prophylaxis:SCDs Code Status:DNR Family Communication:patient Disposition Plan:return to SNF , likely on 12/19 with palliative care to continue following, outpatient transfusion and avoid rehospitalization.   Consultants:  [facility]  Palliative care  Procedures:  prbc transfusion x2 on 12/17  prbc transfusion x1 on  12/18  Antibiotics:  none   Objective: BP (!) 174/58 (BP Location: Left Arm) Comment: informed RN  Pulse 66   Temp 97.8 F (36.6 C) (Oral)   Resp 18   Ht 5' 2.5" (1.588 m)   Wt 82.1 kg (181 lb)   SpO2 100%   BMI 32.58 kg/m   Intake/Output Summary (Last 24 hours) at 08/12/2017 1521 Last data filed at 08/12/2017 1320 Gross per 24 hour  Intake 1805.67 ml  Output -  Net 1805.67 ml   Filed Weights   08/11/17 1538 08/11/17 1551  Weight: 82.2 kg (181 lb 3 oz) 82.1 kg (181 lb)    Exam: Patient is examined daily including today on 08/12/2017, exams remain the same as of yesterday except that has changed    General:  Chronically ill, pale, NAD, AAox3  Eyes: PERRL  ENT: unremarkable  Neck: supple, no JVD  Cardiovascular: RRR  Respiratory: intermittent wheezing  Abdomen: soft/ND/ND, positive bowel sounds  Skin: no rash  Musculoskeletal:  Bilateral pitting edema, report chronic  Psychiatric: calm/cooperative  Neurologic: no focal findings     Data Reviewed: Basic Metabolic Panel: Recent Labs  Lab 08/11/17 1414 08/12/17 0943  NA 144 143  K 4.1 3.8  CL  --  113*  CO2 25 25  GLUCOSE 100 199*  BUN 42.1* 40*  CREATININE 1.4* 1.33*  CALCIUM 8.0* 8.0*   Liver Function Tests: Recent Labs  Lab 08/11/17 1414  AST 34  ALT 25  ALKPHOS 109  BILITOT 0.80  PROT 4.8*  ALBUMIN 2.5*   No results for input(s): LIPASE, AMYLASE in the last 168 hours. No results for input(s): AMMONIA in the last 168 hours. CBC: Recent Labs  Lab 08/11/17 1414 08/12/17 0943  WBC 3.6* 2.9*  NEUTROABS 2.6  --   HGB 7.0* 7.9*  HCT 22.2* 23.5*  MCV 100.0 97.5  PLT 73* 62*   Cardiac Enzymes:   No results for input(s): CKTOTAL, CKMB, CKMBINDEX, TROPONINI in the last 168 hours. BNP (last 3 results) Recent Labs    06/13/17 1826  BNP 78.8    ProBNP (last 3 results) No results for input(s): PROBNP in the last 8760 hours.  CBG: Recent Labs  Lab 08/11/17 2357  GLUCAP  129*    No results found for this or any previous visit (from the past 240 hour(s)).   Studies: No results found.  Scheduled Meds: . atorvastatin  20 mg Oral QHS  . buPROPion  150 mg Oral Daily  . cholecalciferol  1,000 Units Oral Daily  . furosemide  60 mg Intravenous Once  . insulin glargine  60 Units Subcutaneous Q2200  . ipratropium  0.5 mg Nebulization BID  . iron polysaccharides  150 mg Oral TID WC  . isosorbide mononitrate  30 mg Oral Daily  . lactulose  10 g Oral Q6H  . levalbuterol  0.63 mg Nebulization BID  . mometasone-formoterol  2 puff Inhalation BID  . multivitamin with minerals  1 tablet Oral Daily  . polyvinyl alcohol  1 drop Both Eyes q morning - 10a  . promethazine  12.5 mg Oral Q12H  . QUEtiapine  100 mg Oral QHS  . rifaximin  550 mg Oral BID  . rOPINIRole  4 mg Oral QHS  . torsemide  40 mg Oral Daily  . valproic acid  250 mg Oral BID    Continuous Infusions: . sodium chloride    . pantoprozole (PROTONIX) infusion 8 mg/hr (08/12/17 0450)     Time spent: 35 mins I have personally reviewed and interpreted on  08/12/2017 daily labs.  I reviewed all nursing notes, pharmacy notes, consultant notes,  vitals, pertinent old records  I have discussed plan of care as described above with RN , patient  on 08/12/2017   Florencia Reasons MD, PhD  [facility] Pager [phone]. If 7PM-7AM, please contact night-coverage at [email or web address], password [facility] 08/12/2017, 3:21 PM  LOS: 0 days

## 2017-08-13 DIAGNOSIS — K922 Gastrointestinal hemorrhage, unspecified: Secondary | ICD-10-CM | POA: Diagnosis not present

## 2017-08-13 LAB — CBC WITH DIFFERENTIAL/PLATELET
BASOS PCT: 1 %
Basophils Absolute: 0 10*3/uL (ref 0.0–0.1)
Eosinophils Absolute: 0.2 10*3/uL (ref 0.0–0.7)
Eosinophils Relative: 5 %
HEMATOCRIT: 25.3 % — AB (ref 36.0–46.0)
Hemoglobin: 8.6 g/dL — ABNORMAL LOW (ref 12.0–15.0)
Lymphocytes Relative: 14 %
Lymphs Abs: 0.4 10*3/uL — ABNORMAL LOW (ref 0.7–4.0)
MCH: 32.5 pg (ref 26.0–34.0)
MCHC: 34 g/dL (ref 30.0–36.0)
MCV: 95.5 fL (ref 78.0–100.0)
MONO ABS: 0.2 10*3/uL (ref 0.1–1.0)
MONOS PCT: 8 %
NEUTROS ABS: 2.2 10*3/uL (ref 1.7–7.7)
Neutrophils Relative %: 72 %
Platelets: 55 10*3/uL — ABNORMAL LOW (ref 150–400)
RBC: 2.65 MIL/uL — ABNORMAL LOW (ref 3.87–5.11)
RDW: 19.2 % — AB (ref 11.5–15.5)
WBC: 3 10*3/uL — ABNORMAL LOW (ref 4.0–10.5)

## 2017-08-13 LAB — BASIC METABOLIC PANEL
Anion gap: 5 (ref 5–15)
BUN: 39 mg/dL — ABNORMAL HIGH (ref 6–20)
CALCIUM: 8.2 mg/dL — AB (ref 8.9–10.3)
CO2: 25 mmol/L (ref 22–32)
CREATININE: 1.34 mg/dL — AB (ref 0.44–1.00)
Chloride: 112 mmol/L — ABNORMAL HIGH (ref 101–111)
GFR calc non Af Amer: 38 mL/min — ABNORMAL LOW (ref >60)
GFR, EST AFRICAN AMERICAN: 44 mL/min — AB (ref >60)
GLUCOSE: 199 mg/dL — AB (ref 65–99)
Potassium: 3.8 mmol/L (ref 3.5–5.1)
Sodium: 142 mmol/L (ref 135–145)

## 2017-08-13 NOTE — Consult Note (Signed)
Consultation Note Date: 08/13/2017   Patient Name: [name]  DOB: 10/26/1940  MRN: [id number]  Age / Sex: 76 y.o., female  PCP: Hendricks Limes, MD Referring Physician: Georgette Shell, MD  Reason for Consultation: Establishing goals of care  HPI/Patient Profile: 76 y.o. 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   Clinical Assessment and Goals of Care: I met today with [name].   She reports that the most important thing to her is her family and spending time with her children.  She also states that her physicians have been doing a good job talking with her and she understands that she has recurrent bleeding that is not going to stop.  Values and goals of care important to patient and family were attempted to be elicited.  She is talked with multiple physicians about this and has been involved with both palliative care inpatient and outpatient as well as hospice in the past.  We discussed clinical course as well as wishes moving forward in regard to advanced directives.  We discussed her continued transfusion dependence and that this is not a problem that is going to go away.  She reports understanding this but states that she feels at this time that she is still obtaining good quality of life and that her goal is to make it through Christmas with her family.  She then stated that 1 of her sons is actually not coming to January and so this is her actual goal.   We discussed difference between a aggressive medical intervention path and a palliative, comfort focused care path.    Concept of Hospice and Palliative Care were discussed  Questions and concerns addressed.   PMT will continue to support holistically.  SUMMARY OF RECOMMENDATIONS   -DNR/DNI -Discussed at length  continued transfusion dependence and her current quality of life.  She reports that at this time she wants to continue with blood transfusions.  These are being arranged as an outpatient.  We discussed that there is always a possibility that time may come where she is not able to be crossmatch due to antibodies from multiple transfusions. -She understands that if she feels that the burdens of continued transfusion outweigh the benefit that she is getting, care could be focused on her comfort and spending quality time with her family through the assistance of organization such as hospice. Reports being familiar with hospice from past experience  Code Status/Advance Care Planning:  DNR  Palliative Prophylaxis:   Aspiration and Delirium Protocol  Psycho-social/Spiritual:   Desire for further Chaplaincy support:no  Additional Recommendations: Education on Hospice  Prognosis:   < 6 months  Discharge Planning: [place] for rehab with Palliative care service follow-up      Primary Diagnoses: Present on Admission: . GI bleed   I have reviewed the medical record, interviewed the patient and family, and examined the patient. The  following aspects are pertinent.  Past Medical History:  Diagnosis Date  . Adenomatous colon polyp   . Anxiety   . Asthma   . Back pain   . Benign neoplasm of colon   . CAP (community acquired pneumonia) 2015  . Carpal tunnel syndrome on both sides   . Chronic airway obstruction, not elsewhere classified   . Chronic diastolic CHF (congestive heart failure) ([place])    a. 03/26/2014 Echo: EF 55-60%, no rwma, mild AS/AI, mod-sev Ca2+ MV annulus, mildly to mod dil LA.  [name] Coarse tremors    a. arms.  . Diverticulosis of colon (without mention of hemorrhage)   . Esophageal reflux   . Falls frequently    completed HHPT/OT 06/2014, PT unmet goals  . GAVE (gastric antral vascular ectasia)    angiodysplasia; s/p multiple APCs, discussing RFA with  [facility] Dr Newman Pies (06/2015)  . H/O hiatal hernia   . Hepatitis   . Hiatal hernia   . Hyperlipidemia   . Iron deficiency anemia secondary to blood loss (chronic)    a. frequent PRBC transfusions.  . Liver cirrhosis secondary to nonalcoholic steatohepatitis (NASH) ([place])    a. dx'd 1990's  . Major depressive disorder, recurrent episode, severe, specified as with psychotic behavior   . Memory loss   . Midsternal chest pain    a. conservatively managed ->poor candidate for cath/anticoagulation.  . Mild aortic stenosis    a. 03/2014 Valve area (VTI): 2.89 cm^2, Valve area (Vmax): 2.7 cm^2.  . Morbid obesity ([place])   . Neck pain   . Non-obstructive CAD   . On home oxygen therapy    "3L; 24/7" (11/29/2015)  . OSA (obstructive sleep apnea)    a. does not use CPAP. (01/25/2015)  . Osteoarthrosis, unspecified whether generalized or localized, unspecified site   . Osteoporosis, unspecified   . Palpitations   . Portal hypertensive gastropathy ([place])   . Protein calorie malnutrition ([place])   . Recurrent UTI (urinary tract infection)    h/o hospitalization with urosepsis 2015, but thought large component colonization/bacteriuria, only treat if symptomatic ([place])  . Restless legs syndrome (RLS)   . Schizophrenia ([place])   . Thrombocytopenia ([place])   . Type II diabetes mellitus ([place])   . Unspecified chronic bronchitis ([place])   . Unspecified essential hypertension    Social History   Socioeconomic History  . Marital status: Married    Spouse name: jerry Orea  . Number of children: 4  . Years of education: 57  . Highest education level: None  Social Needs  . Financial resource strain: None  . Food insecurity - worry: None  . Food insecurity - inability: None  . Transportation needs - medical: None  . Transportation needs - non-medical: None  Occupational History  . Occupation: Disabled    Employer: RETIRED  Tobacco Use  . Smoking status: Former Smoker    Packs/day: 1.50    Years:  25.00    Pack years: 37.50    Types: Cigarettes    Last attempt to quit: 08/26/1992    Years since quitting: 24.9  . Smokeless tobacco: Never Used  Substance and Sexual Activity  . Alcohol use: No    Alcohol/week: 0.0 oz    Comment: 7/65/46 "last alcoholic drink was years ago"  . Drug use: No  . Sexual activity: No  Other Topics Concern  . None  Social History Narrative   Patient lives at [facility]. Will be living at [facility] ,  living in [place], near [facility] as of 06/18/17.   Retired.   Caffeine- None    Right handed.      Dr. Olevia Perches now [facility] (referred to [facility])   Dr. Burt Knack cards    Dr. Loanne Drilling endo   Dr Lenna Gilford pulm   Psych: Followed by [facility] (ph [phone], fax [phone]).    Family History  Problem Relation Age of Onset  . Heart disease Mother   . Cervical cancer Mother   . Kidney disease Mother   . Diabetes Mother   . Heart attack Mother   . Diabetes Father   . Heart attack Father   . Multiple sclerosis Other   . Breast cancer Sister   . Multiple sclerosis Sister   . Breast cancer Maternal Aunt        x 2  . Diabetes Sister   . Stroke Daughter   . Stroke Daughter   . Lupus Daughter   . Colon cancer Neg Hx    Scheduled Meds: . atorvastatin  20 mg Oral QHS  . buPROPion  150 mg Oral Daily  . cholecalciferol  1,000 Units Oral Daily  . furosemide  40 mg Intravenous Once  . insulin glargine  60 Units Subcutaneous Q2200  . ipratropium  0.5 mg Nebulization BID  . iron polysaccharides  150 mg Oral TID WC  . isosorbide mononitrate  30 mg Oral Daily  . lactulose  10 g Oral Q6H  . levalbuterol  0.63 mg Nebulization BID  . mometasone-formoterol  2 puff Inhalation BID  . multivitamin with minerals  1 tablet Oral Daily  . pantoprazole  20 mg Oral BID AC  . polyvinyl alcohol  1 drop Both Eyes q morning - 10a  . promethazine  12.5 mg Oral Q12H  . QUEtiapine  100 mg Oral QHS  . rifaximin  550 mg Oral BID  .  rOPINIRole  4 mg Oral QHS  . torsemide  40 mg Oral Daily  . valproic acid  250 mg Oral BID   Continuous Infusions: . sodium chloride     PRN Meds:.albuterol, ALPRAZolam, magnesium hydroxide, nitroGLYCERIN, oxyCODONE, sucralfate, traMADol Medications Prior to Admission:  Prior to Admission medications   Medication Sig Start Date End Date Taking? Authorizing Provider  ADVAIR DISKUS 250-50 MCG/DOSE AEPB INHALE 1 PUFF INTO LUNGS TWICE A DAY 05/29/15  Yes Ria Bush, MD  albuterol (PROVENTIL) (2.5 MG/3ML) 0.083% nebulizer solution Take 2.5 mg by nebulization every 6 (six) hours as needed for wheezing or shortness of breath.   Yes [provider]  ALPRAZolam (XANAX) 0.5 MG tablet Take 1 tablet (0.5 mg total) by mouth 2 (two) times daily as needed for anxiety. 07/16/17  Yes Florencia Reasons, MD  atorvastatin (LIPITOR) 20 MG tablet Take 20 mg by mouth daily.    Yes [provider]  bisacodyl (BISACODYL LAXATIVE) 10 MG suppository Place 10 mg rectally daily as needed for moderate constipation. If no relief from MOM.   Yes [provider]  buPROPion (WELLBUTRIN SR) 150 MG 12 hr tablet Take 150 mg by mouth daily.   Yes [provider]  Cholecalciferol 1000 units tablet Take 1,000 Units by mouth daily.   Yes [provider]  Darbepoetin Alfa (ARANESP) 100 MCG/0.5ML SOSY injection Inject 100 mcg every 14 (fourteen) days into the skin.   Yes [provider]  dicyclomine (BENTYL) 20 MG tablet Take 1 tablet (20 mg total) by mouth 2 (two) times daily. 04/07/16  Yes  Doristine Devoid, PA-C  Insulin Glargine (LANTUS SOLOSTAR) 100 UNIT/ML Solostar Pen Inject 60 Units into the skin daily at 10 pm.    Yes [provider]  insulin lispro (HUMALOG KWIKPEN) 100 UNIT/ML KiwkPen Inject 3 Units into the skin daily before breakfast. Hold if blood sugar is <100   Yes [provider]  iron polysaccharides (NIFEREX) 150 MG capsule Take 150 mg by mouth 3  (three) times daily with meals.   Yes [provider]  isosorbide mononitrate (IMDUR) 30 MG 24 hr tablet Take 1 tablet (30 mg total) by mouth daily. 04/01/16  Yes Weaver, Scott T, PA-C  lactulose (CHRONULAC) 10 GM/15ML solution Take 10 g by mouth 4 (four) times daily.   Yes [provider]  levalbuterol (XOPENEX) 0.63 MG/3ML nebulizer solution Take 0.63 mg by nebulization 2 (two) times daily.  06/05/14  Yes [provider]  magnesium hydroxide (MILK OF MAGNESIA) 400 MG/5ML suspension Take 30 mLs by mouth daily as needed (Constipation. Give if no BM in 3 days).    Yes [provider]  Melatonin 3 MG TABS Take 3 mg by mouth at bedtime.   Yes [provider]  Multiple Vitamin (MULTIVITAMIN WITH MINERALS) TABS tablet Take 1 tablet by mouth daily.    Yes [provider]  nitroGLYCERIN (NITROSTAT) 0.4 MG SL tablet Place 1 tablet (0.4 mg total) under the tongue every 5 (five) minutes as needed for chest pain. No more than 3 in 15 minutes 02/16/16  Yes Sherren Mocha, MD  oxyCODONE (OXY IR/ROXICODONE) 5 MG immediate release tablet Take 1 tablet (5 mg total) by mouth every 6 (six) hours as needed for moderate pain or severe pain. 07/16/17  Yes Florencia Reasons, MD  OXYGEN Inhale 2 L into the lungs continuous.    Yes [provider]  pantoprazole (PROTONIX) 20 MG tablet Take 20 mg by mouth 2 (two) times daily before a meal.    Yes [provider]  potassium chloride SA (K-DUR,KLOR-CON) 20 MEQ tablet Take 20 mEq by mouth daily.   Yes [provider]  promethazine (PHENERGAN) 12.5 MG tablet Take 12.5 mg by mouth every 12 (twelve) hours.   Yes [provider]  Propylene Glycol (SYSTANE BALANCE) 0.6 % SOLN Place 1 drop into both eyes every morning.   Yes [provider]  QUEtiapine (SEROQUEL) 100 MG tablet Take 100 mg by mouth at bedtime.   Yes [provider]  rifaximin (XIFAXAN) 550 MG TABS tablet Take 550 mg by  mouth 2 (two) times daily.   Yes [provider]  rOPINIRole (REQUIP) 4 MG tablet Take 4 mg by mouth at bedtime.   Yes [provider]  Skin Protectants, Misc. (EUCERIN) cream Apply 1 application daily topically. Apply to nasal septum bilaterally to prevent epistaxis from oxygen-induced drying    Yes [provider]  Sodium Phosphates (RA SALINE ENEMA) 19-7 GM/118ML ENEM Place 1 application rectally daily as needed (Severe constipation). If bisacodyl suppository doesn't work.   Yes [provider]  sucralfate (CARAFATE) 1 GM/10ML suspension Take 1 g by mouth every 6 (six) hours as needed (ulcer).    Yes [provider]  torsemide (DEMADEX) 20 MG tablet Take 40 mg by mouth daily.   Yes [provider]  traMADol (ULTRAM) 50 MG tablet Take 1 tablet (50 mg total) by mouth every 12 (twelve) hours as needed for moderate pain. Not to be given with Xanax. 07/16/17  Yes Florencia Reasons, MD  valproic  acid (DEPAKENE) 250 MG capsule Take 250 mg by mouth 2 (two) times daily.   Yes [provider]  tranexamic acid (LYSTEDA) 650 MG TABS tablet Take 2 tablets (1,300 mg total) by mouth 2 (two) times daily. 08/11/17   Brunetta Genera, MD   Allergies  Allergen Reactions  . Acetaminophen Other (See Comments)    Liver dysfunction  . Nsaids Other (See Comments)    Liver dysfunction   . Aspirin Other (See Comments)    Causes nosebleeds  . Theophylline Nausea And Vomiting  . Penicillins Itching and Other (See Comments)    Has patient had a PCN reaction causing immediate rash, facial/tongue/throat swelling, SOB or lightheadedness with hypotension: unknown Has patient had a PCN reaction causing severe rash involving mucus membranes or skin necrosis: unknown Has patient had a PCN reaction that required hospitalization unknown Has patient had a PCN reaction occurring within the last 10 years: yes If all of the above answers are "NO", then may proceed with  Cephalosporin use.    Review of Systems  Constitutional: Positive for activity change, appetite change and fatigue.  Gastrointestinal: Positive for abdominal pain.  Neurological: Positive for weakness.   Physical Exam  General: Alert, awake, in no acute distress.  Sitting in bedside chair.  HEENT: No bruits, no goiter, no JVD Heart: Regular rate and rhythm. No murmur appreciated. Lungs: Fair air movement, clear Abdomen: Soft, nontender, nondistended, positive bowel sounds.  Ext: + edema Skin: Warm and dry Neuro: Grossly intact, nonfocal.   Vital Signs: BP 129/62 (BP Location: Right Arm)   Pulse 67   Temp 98.6 F (37 C) (Oral)   Resp 17   Ht 5' 2.5" (1.588 m)   Wt 82.1 kg (181 lb)   SpO2 99%   BMI 32.58 kg/m   Pain Assessment: No/denies pain   Pain Score: 0-No pain   SpO2: SpO2: 99 % O2 Device:SpO2: 99 % O2 Flow Rate: .O2 Flow Rate (L/min): 3 L/min  IO: Intake/output summary:   Intake/Output Summary (Last 24 hours) at 08/13/2017 5329 Last data filed at 08/12/2017 2049 Gross per 24 hour  Intake 670 ml  Output -  Net 670 ml    LBM: Last BM Date: 08/12/17 Baseline Weight: Weight: 82.2 kg (181 lb 3 oz)(Pt weighed at the nursing home today according to pt.) Most recent weight: Weight: 82.1 kg (181 lb)     Palliative Assessment/Data:   Flowsheet Rows     Most Recent Value  Intake Tab  Referral Department  Hospitalist  Unit at Time of Referral  Oncology Unit  Palliative Care Primary Diagnosis  Other (Comment) [GI/Anemia]  Date Notified  08/11/17  Palliative Care Type  Return patient Palliative Care  Reason for referral  Clarify Goals of Care  Date of Admission  08/11/17  Date first seen by Palliative Care  08/12/17  # of days Palliative referral response time  1 Day(s)  # of days IP prior to Palliative referral  0  Clinical Assessment  Palliative Performance Scale Score  40%  Pain Max last 24 hours  5  Pain Min Last 24 hours  0  Psychosocial & Spiritual  Assessment  Palliative Care Outcomes  Patient/Family meeting held?  Yes  Who was at the meeting?  Patient      Time In: 9242 Time Out: 1800 Time Total: 75 Greater than 50%  of this time was spent counseling and coordinating care related to the above assessment and plan.  Signed by: Micheline Rough,  MD   Please contact Palliative Medicine Team phone at [phone] for questions and concerns.  For individual provider: See Shea Evans

## 2017-08-13 NOTE — Progress Notes (Signed)
Patient is discharging back to SNF[facility]. CSW sent D/C summary via HUB. Facility liasion Suanne Marker report the facility will not need FL2.  CSW informed the patient daughter [name] of D/C PTAR scheduled for 1:00pm Physician informed to sign DNR Nurse call report to: [phone]  Deirdre Pippins, MSW Clinical Social Worker  [phone] 08/13/2017  11:41 AM

## 2017-08-13 NOTE — Discharge Summary (Signed)
Physician Discharge Summary  [name] UUV:[id number] DOB: 1940/09/10 DOA: 08/11/2017  PCP: Hendricks Limes, MD  Admit date: 08/11/2017 Discharge date: 08/13/2017  Admitted From: nursing home  [facility] Recommendations for Outpatient Follow-up:  1. Follow up with PCP in 1-2 weeks 2. Please obtain BMP/CBC in one week  Home Health:none Equipment/Devices oxygen Discharge Condition:stable CODE STATUS:dnr Diet recommendation cardiac  :Brief/Interim Summary:76 y.o. female  with medical history significant ofNash cirrhosis, [facility], chronic diastolic CHF, COPD with chronic hypoxic respiratory failure on home o2 sent from hematology office due to anemia requiring blood transfusion. Patient has know gi bleed has been hospitalized for multiple times to get blood transfusions.  Today she is seeing hematology in the process of trying to set up outpatient blood transfusion every 2 weeks.  Her hemoglobin is found to be 7, dropped from 10 tested 10 days ago.  She is sent to the hospital to get blood transfusion.  Otherwise there are no acute findings. 2units prbc ordered by EDP, hospitalist called to continue manage the patient.     Discharge Diagnoses:  Active Problems:   GI bleed  RecurrentGI bleed fromG[facility] -patient has multiple hospitalizations recentlyforblood transfusion.GI office /hematology officeto arrange outpatient transfusionto prevent further rehospitalizations. -[facility] consulted who recommend palliative care and transition to hospice when patient is ready. -patient does want to have transfusions and medical care.Continue ppi / carafate . Prbc transfusion prn. (per blood bank, it is difficult to find the right match, blood has to be ordered from [place] ) -palliative care consult appreciated.  Nash cirrhosiswith chronic thrombocytopenia -With prior hepatic  encephalopathy, on lactulose,xifaximin.continue - her platelets seem relatively stable, close to baseline  Insulin dependentType 2 diabetes mellitus -continueLantusand meal coverage insulin.  Chronic kidney disease stage III -Creatinine appears close to baseline.  Chronic diastolic CHF -some bilateral pitting edema, report chronic, continue home meds demadex Additional lasix 60mg  for wheezing on 12/18 am.    COPD with chronic hypoxic respiratory failureon home o2 -she was wheezing this am on 12/18, one dose lasix and additional nebs ordered,  -continue home medicationsand oxygen supplement    Discharge Instructions   Allergies as of 08/13/2017      Reactions   Acetaminophen Other (See Comments)   Liver dysfunction   Nsaids Other (See Comments)   Liver dysfunction    Aspirin Other (See Comments)   Causes nosebleeds   Theophylline Nausea And Vomiting   Penicillins Itching, Other (See Comments)   Has patient had a PCN reaction causing immediate rash, facial/tongue/throat swelling, SOB or lightheadedness with hypotension: unknown Has patient had a PCN reaction causing severe rash involving mucus membranes or skin necrosis: unknown Has patient had a PCN reaction that required hospitalization unknown Has patient had a PCN reaction occurring within the last 10 years: yes If all of the above answers are "NO", then may proceed with Cephalosporin use.      Medication List    STOP taking these medications   BISACODYL LAXATIVE 10 MG suppository Generic drug:  bisacodyl     TAKE these medications   ADVAIR DISKUS 250-50 MCG/DOSE Aepb Generic drug:  Fluticasone-Salmeterol INHALE 1 PUFF INTO LUNGS TWICE A DAY   albuterol (2.5 MG/3ML) 0.083% nebulizer solution Commonly known as:  PROVENTIL Take 2.5 mg by nebulization every 6 (six) hours as needed for wheezing or shortness of breath.   ALPRAZolam 0.5 MG tablet Commonly known as:  XANAX Take 1 tablet (0.5 mg total)  by mouth 2 (two) times daily as needed for anxiety.   atorvastatin 20 MG tablet Commonly known as:  LIPITOR Take 20 mg by mouth daily.   buPROPion 150 MG 12 hr tablet Commonly known as:  WELLBUTRIN SR Take 150 mg by mouth daily.   Cholecalciferol 1000 units tablet Take 1,000 Units by mouth daily.   Darbepoetin Alfa 100 MCG/0.5ML Sosy injection Commonly known as:  ARANESP Inject 100 mcg every 14 (fourteen) days into the skin.   DEPAKENE 250 MG capsule Generic drug:  valproic acid Take 250 mg by mouth 2 (two) times daily.   dicyclomine 20 MG tablet Commonly known as:  BENTYL Take 1 tablet (20 mg total) by mouth 2 (two) times daily.   eucerin cream Apply 1 application daily topically. Apply to nasal septum bilaterally to prevent epistaxis from oxygen-induced drying   HUMALOG KWIKPEN 100 UNIT/ML KiwkPen Generic drug:  insulin lispro Inject 3 Units into the skin daily before breakfast. Hold if blood sugar is <100   iron polysaccharides 150 MG capsule Commonly known as:  NIFEREX Take 150 mg by mouth 3 (three) times daily with meals.   isosorbide mononitrate 30 MG 24 hr tablet Commonly known as:  IMDUR Take 1 tablet (30 mg total) by mouth daily.   lactulose 10 GM/15ML solution Commonly known as:  CHRONULAC Take 10 g by mouth 4 (four) times daily.   LANTUS SOLOSTAR 100 UNIT/ML Solostar Pen Generic drug:  Insulin Glargine Inject 60 Units into the skin daily at 10 pm.   levalbuterol 0.63 MG/3ML nebulizer solution Commonly known as:  XOPENEX Take 0.63 mg by nebulization 2 (two) times daily.   magnesium hydroxide 400 MG/5ML suspension Commonly known as:  MILK OF MAGNESIA Take 30 mLs by mouth daily as needed (Constipation. Give if no BM in 3 days).   Melatonin 3 MG Tabs Take 3 mg by mouth at bedtime.   multivitamin with minerals Tabs tablet Take 1 tablet by mouth daily.   nitroGLYCERIN 0.4 MG SL tablet Commonly known as:  NITROSTAT Place 1 tablet (0.4 mg total)  under the tongue every 5 (five) minutes as needed for chest pain. No more than 3 in 15 minutes   oxyCODONE 5 MG immediate release tablet Commonly known as:  Oxy IR/ROXICODONE Take 1 tablet (5 mg total) by mouth every 6 (six) hours as needed for moderate pain or severe pain.   OXYGEN Inhale 2 L into the lungs continuous.   pantoprazole 20 MG tablet Commonly known as:  PROTONIX Take 20 mg by mouth 2 (two) times daily before a meal.   potassium chloride SA 20 MEQ tablet Commonly known as:  K-DUR,KLOR-CON Take 20 mEq by mouth daily.   promethazine 12.5 MG tablet Commonly known as:  PHENERGAN Take 12.5 mg by mouth every 12 (twelve) hours.   QUEtiapine 100 MG tablet Commonly known as:  SEROQUEL Take 100 mg by mouth at bedtime.   RA SALINE ENEMA 19-7 GM/118ML Enem Place 1 application rectally daily as needed (Severe constipation). If bisacodyl suppository doesn't work.   rifaximin 550 MG Tabs tablet Commonly known as:  XIFAXAN Take 550 mg by mouth 2 (two) times daily.   rOPINIRole 4 MG tablet Commonly known as:  REQUIP Take 4 mg by mouth at bedtime.   sucralfate 1 GM/10ML suspension Commonly known as:  CARAFATE Take 1 g by mouth every 6 (six) hours as needed (ulcer).   SYSTANE BALANCE 0.6 % Soln Generic drug:  Propylene Glycol Place 1 drop into  both eyes every morning.   torsemide 20 MG tablet Commonly known as:  DEMADEX Take 40 mg by mouth daily.   traMADol 50 MG tablet Commonly known as:  ULTRAM Take 1 tablet (50 mg total) by mouth every 12 (twelve) hours as needed for moderate pain. Not to be given with Xanax.   tranexamic acid 650 MG Tabs tablet Commonly known as:  LYSTEDA Take 2 tablets (1,300 mg total) by mouth 2 (two) times daily.       Allergies  Allergen Reactions  . Acetaminophen Other (See Comments)    Liver dysfunction  . Nsaids Other (See Comments)    Liver dysfunction   . Aspirin Other (See Comments)    Causes nosebleeds  . Theophylline Nausea  And Vomiting  . Penicillins Itching and Other (See Comments)    Has patient had a PCN reaction causing immediate rash, facial/tongue/throat swelling, SOB or lightheadedness with hypotension: unknown Has patient had a PCN reaction causing severe rash involving mucus membranes or skin necrosis: unknown Has patient had a PCN reaction that required hospitalization unknown Has patient had a PCN reaction occurring within the last 10 years: yes If all of the above answers are "NO", then may proceed with Cephalosporin use.     Consultations: Palliative medicine  Procedures/Studies:  No results found. (Echo, Carotid, EGD, Colonoscopy, ERCP)    Subjective: Wants to go back to the nursing home.  Discharge Exam: Vitals:   08/13/17 0435 08/13/17 0813  BP: 129/62   Pulse: 67 (!) 55  Resp: 17 16  Temp: 98.6 F (37 C)   SpO2: 99% 97%   Vitals:   08/12/17 2015 08/12/17 2049 08/13/17 0435 08/13/17 0813  BP:  (!) 137/46 129/62   Pulse:  60 67 (!) 55  Resp:  16 17 16   Temp:  98.1 F (36.7 C) 98.6 F (37 C)   TempSrc:  Oral Oral   SpO2: 99% 100% 99% 97%  Weight:      Height:        General: Pt is alert, awake, not in acute distress Cardiovascular: RRR, S1/S2 +, no rubs, no gallops Respiratory: CTA bilaterally, no wheezing, no rhonchi Abdominal: Soft, NT, ND, bowel sounds + Extremities: no edema, no cyanosis    The results of significant diagnostics from this hospitalization (including imaging, microbiology, ancillary and laboratory) are listed below for reference.     Microbiology: No results found for this or any previous visit (from the past 240 hour(s)).   Labs: BNP (last 3 results) Recent Labs    06/13/17 1826  BNP 18.2   Basic Metabolic Panel: Recent Labs  Lab 08/11/17 1414 08/12/17 0943 08/13/17 0633  NA 144 143 142  K 4.1 3.8 3.8  CL  --  113* 112*  CO2 25 25 25   GLUCOSE 100 199* 199*  BUN 42.1* 40* 39*  CREATININE 1.4* 1.33* 1.34*  CALCIUM 8.0* 8.0*  8.2*   Liver Function Tests: Recent Labs  Lab 08/11/17 1414  AST 34  ALT 25  ALKPHOS 109  BILITOT 0.80  PROT 4.8*  ALBUMIN 2.5*   No results for input(s): LIPASE, AMYLASE in the last 168 hours. No results for input(s): AMMONIA in the last 168 hours. CBC: Recent Labs  Lab 08/11/17 1414 08/12/17 0943 08/13/17 0633  WBC 3.6* 2.9* 3.0*  NEUTROABS 2.6  --  2.2  HGB 7.0* 7.9* 8.6*  HCT 22.2* 23.5* 25.3*  MCV 100.0 97.5 95.5  PLT 73* 62* 55*   Cardiac Enzymes: No  results for input(s): CKTOTAL, CKMB, CKMBINDEX, TROPONINI in the last 168 hours. BNP: Invalid input(s): POCBNP CBG: Recent Labs  Lab 08/11/17 2357  GLUCAP 129*   D-Dimer No results for input(s): DDIMER in the last 72 hours. Hgb A1c No results for input(s): HGBA1C in the last 72 hours. Lipid Profile No results for input(s): CHOL, HDL, LDLCALC, TRIG, CHOLHDL, LDLDIRECT in the last 72 hours. Thyroid function studies No results for input(s): TSH, T4TOTAL, T3FREE, THYROIDAB in the last 72 hours.  Invalid input(s): FREET3 Anemia work up Recent Labs    08/11/17 1414  FERRITIN 219  RETICCTPCT 7.00*   Urinalysis    Component Value Date/Time   COLORURINE YELLOW 08/01/2017 [place] 08/01/2017 0650   LABSPEC 1.014 08/01/2017 0650   PHURINE 5.0 08/01/2017 0650   GLUCOSEU NEGATIVE 08/01/2017 0650   GLUCOSEU NEGATIVE 07/17/2015 0907   HGBUR NEGATIVE 08/01/2017 0650   BILIRUBINUR NEGATIVE 08/01/2017 0650   BILIRUBINUR negative 02/26/2016 1206   [place] 08/01/2017 0650   PROTEINUR NEGATIVE 08/01/2017 0650   UROBILINOGEN 4.0 02/26/2016 1206   UROBILINOGEN 0.2 07/17/2015 0907   NITRITE NEGATIVE 08/01/2017 0650   LEUKOCYTESUR NEGATIVE 08/01/2017 0650   Sepsis Labs Invalid input(s): PROCALCITONIN,  WBC,  LACTICIDVEN Microbiology No results found for this or any previous visit (from the past 240 hour(s)).   Time coordinating discharge: Over 30 minutes  SIGNED:   Georgette Shell, MD  [facility] 08/13/2017, 10:48 AM Pager   If 7PM-7AM, please contact night-coverage [email or web address] Password [email or web address]

## 2017-08-14 ENCOUNTER — Telehealth: Payer: Self-pay

## 2017-08-14 NOTE — Telephone Encounter (Signed)
Possible re-admission to facility. This is a patient you were seeing at [facility] . [facility] F/U is needed if patient was re-admitted to facility upon discharge. Hospital discharge from [facility] on 08/13/17.

## 2017-08-15 LAB — TYPE AND SCREEN
ABO/RH(D): O POS
Antibody Screen: NEGATIVE
DONOR AG TYPE: NEGATIVE
Donor AG Type: NEGATIVE
Donor AG Type: NEGATIVE
UNIT DIVISION: 0
UNIT DIVISION: 0
Unit division: 0
Unit division: 0

## 2017-08-15 LAB — BPAM RBC
BLOOD PRODUCT EXPIRATION DATE: 201901182359
BLOOD PRODUCT EXPIRATION DATE: 201901232359
BLOOD PRODUCT EXPIRATION DATE: 201901232359
Blood Product Expiration Date: 201901232359
ISSUE DATE / TIME: 201812180118
ISSUE DATE / TIME: 201812180428
ISSUE DATE / TIME: 201812181800
UNIT TYPE AND RH: 5100
UNIT TYPE AND RH: 9500
Unit Type and Rh: 5100
Unit Type and Rh: 5100

## 2017-08-21 LAB — CBC AND DIFFERENTIAL
HCT: 25 — AB (ref 36–46)
Hemoglobin: 8.5 — AB (ref 12.0–16.0)
NEUTROS ABS: 3
Platelets: 104 — AB (ref 150–399)
WBC: 4.4

## 2017-08-25 ENCOUNTER — Other Ambulatory Visit: Payer: Self-pay

## 2017-08-25 ENCOUNTER — Ambulatory Visit (HOSPITAL_BASED_OUTPATIENT_CLINIC_OR_DEPARTMENT_OTHER): Payer: Medicare Other

## 2017-08-25 ENCOUNTER — Other Ambulatory Visit (HOSPITAL_BASED_OUTPATIENT_CLINIC_OR_DEPARTMENT_OTHER): Payer: Medicare Other

## 2017-08-25 ENCOUNTER — Ambulatory Visit (HOSPITAL_COMMUNITY)
Admission: RE | Admit: 2017-08-25 | Discharge: 2017-08-25 | Disposition: A | Discharge status: Home / Self Care | Payer: Medicare Other | Source: Ambulatory Visit | Attending: Hematology and Oncology | Admitting: Hematology and Oncology

## 2017-08-25 DIAGNOSIS — K922 Gastrointestinal hemorrhage, unspecified: Secondary | ICD-10-CM

## 2017-08-25 DIAGNOSIS — D649 Anemia, unspecified: Secondary | ICD-10-CM

## 2017-08-25 DIAGNOSIS — D500 Iron deficiency anemia secondary to blood loss (chronic): Secondary | ICD-10-CM

## 2017-08-25 LAB — BASIC METABOLIC PANEL
BUN: 36 — AB (ref 4–21)
BUN: 36 — AB (ref 4–21)
CREATININE: 1.5 — AB (ref 0.5–1.1)
CREATININE: 1.5 — AB (ref 0.5–1.1)
Glucose: 78
Glucose: 78
Potassium: 3.4 (ref 3.4–5.3)
Potassium: 3.4 (ref 3.4–5.3)
SODIUM: 144 (ref 137–147)
Sodium: 144 (ref 137–147)

## 2017-08-25 LAB — CBC & DIFF AND RETIC
BASO%: 0.3 % (ref 0.0–2.0)
Basophils Absolute: 0 10*3/uL (ref 0.0–0.1)
EOS ABS: 0.1 10*3/uL (ref 0.0–0.5)
EOS%: 2.6 % (ref 0.0–7.0)
HCT: 23.4 % — ABNORMAL LOW (ref 34.8–46.6)
HGB: 7.4 g/dL — ABNORMAL LOW (ref 11.6–15.9)
IMMATURE RETIC FRACT: 9.7 % (ref 1.60–10.00)
LYMPH%: 7.5 % — AB (ref 14.0–49.7)
MCH: 31.5 pg (ref 25.1–34.0)
MCHC: 31.6 g/dL (ref 31.5–36.0)
MCV: 99.6 fL (ref 79.5–101.0)
MONO#: 0.4 10*3/uL (ref 0.1–0.9)
MONO%: 9.3 % (ref 0.0–14.0)
NEUT%: 80.3 % — ABNORMAL HIGH (ref 38.4–76.8)
NEUTROS ABS: 3.1 10*3/uL (ref 1.5–6.5)
NRBC: 0 % (ref 0–0)
PLATELETS: 78 10*3/uL — AB (ref 145–400)
RBC: 2.35 10*6/uL — AB (ref 3.70–5.45)
RDW: 19.5 % — AB (ref 11.2–14.5)
Retic %: 6.2 % — ABNORMAL HIGH (ref 0.70–2.10)
Retic Ct Abs: 145.7 10*3/uL — ABNORMAL HIGH (ref 33.70–90.70)
WBC: 3.9 10*3/uL (ref 3.9–10.3)
lymph#: 0.3 10*3/uL — ABNORMAL LOW (ref 0.9–3.3)

## 2017-08-25 LAB — CBC AND DIFFERENTIAL
HCT: 20 — AB (ref 36–46)
HCT: 20 — AB (ref 36–46)
HEMOGLOBIN: 6.8 — AB (ref 12.0–16.0)
Hemoglobin: 6.8 — AB (ref 12.0–16.0)
Neutrophils Absolute: 2
Neutrophils Absolute: 2
PLATELETS: 73 — AB (ref 150–399)
PLATELETS: 73 — AB (ref 150–399)
WBC: 2.5
WBC: 2.5

## 2017-08-25 LAB — HEPATIC FUNCTION PANEL
ALK PHOS: 118 (ref 25–125)
ALT: 26 (ref 7–35)
ALT: 26 (ref 7–35)
AST: 37 — AB (ref 13–35)
AST: 37 — AB (ref 13–35)
Alkaline Phosphatase: 118 (ref 25–125)
BILIRUBIN, TOTAL: 0.5
Bilirubin, Total: 0.5

## 2017-08-25 LAB — PREPARE RBC (CROSSMATCH)

## 2017-08-25 LAB — FERRITIN: FERRITIN: 179 ng/mL (ref 9–269)

## 2017-08-25 MED ORDER — DIPHENHYDRAMINE HCL 25 MG PO CAPS
ORAL_CAPSULE | ORAL | Status: AC
  Filled 2017-08-25: qty 1

## 2017-08-25 MED ORDER — ACETAMINOPHEN 325 MG PO TABS
ORAL_TABLET | ORAL | Status: AC
  Filled 2017-08-25: qty 2

## 2017-08-25 MED ORDER — DIPHENHYDRAMINE HCL 25 MG PO CAPS
25.0000 mg | ORAL_CAPSULE | Freq: Once | ORAL | Status: AC
  Administered 2017-08-25: 25 mg via ORAL

## 2017-08-25 MED ORDER — SODIUM CHLORIDE 0.9 % IV SOLN
250.0000 mL | Freq: Once | INTRAVENOUS | Status: AC
  Administered 2017-08-25: 250 mL via INTRAVENOUS

## 2017-08-25 MED ORDER — ACETAMINOPHEN 325 MG PO TABS: 650.0000 mg | ORAL_TABLET | Freq: Once | ORAL | Status: DC

## 2017-08-25 NOTE — Patient Instructions (Signed)

## 2017-08-26 LAB — TYPE AND SCREEN
ABO/RH(D): O POS
ANTIBODY SCREEN: NEGATIVE
DONOR AG TYPE: NEGATIVE
DONOR AG TYPE: NEGATIVE
UNIT DIVISION: 0
Unit division: 0

## 2017-08-26 LAB — BPAM RBC
Blood Product Expiration Date: 201901082359
Blood Product Expiration Date: 201901232359
ISSUE DATE / TIME: 201812311419
ISSUE DATE / TIME: 201812311734
UNIT TYPE AND RH: 9500
Unit Type and Rh: 9500

## 2017-08-27 ENCOUNTER — Telehealth: Payer: Self-pay

## 2017-08-27 ENCOUNTER — Ambulatory Visit (HOSPITAL_COMMUNITY)
Admission: RE | Admit: 2017-08-27 | Discharge: 2017-08-27 | Disposition: A | Discharge status: Home / Self Care | Payer: Medicare Other | Source: Ambulatory Visit | Attending: Oncology | Admitting: Oncology

## 2017-08-27 NOTE — Telephone Encounter (Signed)
VM from 08/25/17 from [place] in Oncology Lab. Pt Hgb 7.4 and Plt 70k. Pt received 2U PRBCs on 08/25/17.

## 2017-09-04 ENCOUNTER — Ambulatory Visit: Payer: Medicare Other | Admitting: Gastroenterology

## 2017-09-05 ENCOUNTER — Ambulatory Visit: Payer: Medicare Other | Admitting: Hematology

## 2017-09-05 NOTE — Progress Notes (Signed)
HEMATOLOGY/ONCOLOGY CONSULTATION NOTE  Date of Service: 09/08/2017  Patient Care Team: Hendricks Limes, MD as PCP - General (Internal Medicine)  CHIEF COMPLAINTS/PURPOSE OF CONSULTATION:  Anemia requiring frequent transfusions with thrombocytopenia   HISTORY OF PRESENTING ILLNESS:   [name] is a wonderful 77 y.o. female who has been referred to [place] by Dr Linna Darner, Darrick Penna, MD for evaluation and management of IDA. Of note, she currently lives in [facility] assisted [facility] facility. She presents with her two daughters. She has a h/o advanced liver cirrhosis, GAVE, CHF, DMII, and CAD among other chronic medical conditions listed below.   The patient currently has GAVE and has had issues with bleeding. She underwent argon beam ablation on Oct 24th, 2018 with Dr Havery Moros and she has continued to have issues with anemia and bleeding since then. She is not currently a candidate for another ablation. He has referred her our care at this point as he feels like he has exhausted all of his current options. She is currently on ProCrit which is given at her nursing home and ordered by Dr Linna Darner.   Over the past several months since around the end of September, she has been admitted into the hospital for symptomatic anemia and gastrointestinal hemorrhage seven separate times. She was last admitted for this on 07/31/17 and given 2u of PRBC, Hgb was correct to 10.1 from 6.6 at that time.   Symptomatically on ROS, she reports that she has been intermittently lightheaded and dizzy. She also notes shortness of breath which is somewhat worsened from her baseline and worse with activity. She has been experiencing melena almost with every bowel movement. She also reports some pain and weakness into her lower extremities. She reports occasional abdominal which extends into her back along the epigastric, RUQ, and LUQ regions. Pt denies fever, chills, rash, mouth sores, weight loss, decreased appetite,  urinary complaints. Pt denies nausea, vomiting. She currently ambulates with a walker at baseline. Pertinent positives are otherwise listed within the above HPI.    INTERVAL HISTORY:  [name] presents to the office today accompanied by her daughters. She reports that she is doing well overall. She notes that she enjoyed the New Year and all of her sister's came to visit her. During Christmas she went and spent time with her daughter where she enjoyed a meal with family.   She received a blood transfusion on 08/25/2017 and felt better for a couple of days. Her hg today is at 7.2 today. She reports that she has leg weakness prior to a transfusion. She has had one bowel movement daily and her bowel movements are mostly black in appearance with some brown color noted.   Daughters voice concern for an iron transfusion at this time and if the patient could receive that today. She notes that she discontinued her aranesp use that is being prescribed to her by the NP at [facility]. She reports that she has several iron transfusions in her lifetime.  On review of systems, pt reports bilateral ankle swelling, abdominal distension, weight gain (17 lbs since 08/11/17). She notes that she is on torsemide which is helping slightly with her swelling. She reports melena. She denies any other symptoms.    MEDICAL HISTORY:  Past Medical History:  Diagnosis Date  . Adenomatous colon polyp   . Anxiety   . Asthma   . Back pain   . Benign neoplasm of colon   . CAP (community acquired pneumonia) 2015  .  Carpal tunnel syndrome on both sides   . Chronic airway obstruction, not elsewhere classified   . Chronic diastolic CHF (congestive heart failure) ([place])    a. 03/26/2014 Echo: EF 55-60%, no rwma, mild AS/AI, mod-sev Ca2+ MV annulus, mildly to mod dil LA.  [name] Coarse tremors    a. arms.  . Diverticulosis of colon (without mention of hemorrhage)   . Esophageal reflux   . Falls frequently    completed HHPT/OT  06/2014, PT unmet goals  . GAVE (gastric antral vascular ectasia)    angiodysplasia; s/p multiple APCs, discussing RFA with [facility] Dr Newman Pies (06/2015)  . H/O hiatal hernia   . Hepatitis   . Hiatal hernia   . Hyperlipidemia   . Iron deficiency anemia secondary to blood loss (chronic)    a. frequent PRBC transfusions.  . Liver cirrhosis secondary to nonalcoholic steatohepatitis (NASH) ([place])    a. dx'd 1990's  . Major depressive disorder, recurrent episode, severe, specified as with psychotic behavior   . Memory loss   . Midsternal chest pain    a. conservatively managed ->poor candidate for cath/anticoagulation.  . Mild aortic stenosis    a. 03/2014 Valve area (VTI): 2.89 cm^2, Valve area (Vmax): 2.7 cm^2.  . Morbid obesity ([place])   . Neck pain   . Non-obstructive CAD   . On home oxygen therapy    "3L; 24/7" (11/29/2015)  . OSA (obstructive sleep apnea)    a. does not use CPAP. (01/25/2015)  . Osteoarthrosis, unspecified whether generalized or localized, unspecified site   . Osteoporosis, unspecified   . Palpitations   . Portal hypertensive gastropathy ([place])   . Protein calorie malnutrition ([place])   . Recurrent UTI (urinary tract infection)    h/o hospitalization with urosepsis 2015, but thought large component colonization/bacteriuria, only treat if symptomatic ([place])  . Restless legs syndrome (RLS)   . Schizophrenia ([place])   . Thrombocytopenia ([place])   . Type II diabetes mellitus ([place])   . Unspecified chronic bronchitis ([place])   . Unspecified essential hypertension     SURGICAL HISTORY: Past Surgical History:  Procedure Laterality Date  . APPENDECTOMY    . BALLOON DILATION  06/12/2012   Procedure: BALLOON DILATION;  Surgeon: [place] Dragon, MD;  Location: [facility] ENDOSCOPY;  Service: Endoscopy;  Laterality: N/A;  ?balloon  . BREAST BIOPSY     bilateral  . CESAREAN SECTION     x 3  . ENTEROSCOPY N/A 02/25/2015   Procedure: ENTEROSCOPY;  Surgeon: [name] Ada, MD;  Location:  [facility] ENDOSCOPY;  Service: Endoscopy;  Laterality: N/A;  . ENTEROSCOPY N/A 06/27/2015   Procedure: ENTEROSCOPY;  Surgeon: Manus Gunning, MD;  Location: [facility] ENDOSCOPY;  Service: Gastroenterology;  Laterality: N/A;  . ESOPHAGOGASTRODUODENOSCOPY  06/12/2012   Procedure: ESOPHAGOGASTRODUODENOSCOPY (EGD);  Surgeon: [place] Dragon, MD;  Location: [facility];  Service: Endoscopy;  Laterality: N/A;  . ESOPHAGOGASTRODUODENOSCOPY  09/10/2012   Procedure: ESOPHAGOGASTRODUODENOSCOPY (EGD);  Surgeon: [place] Dragon, MD;  Location: [facility];  Service: Endoscopy;  Laterality: N/A;  . ESOPHAGOGASTRODUODENOSCOPY N/A 01/18/2013   Procedure: ESOPHAGOGASTRODUODENOSCOPY (EGD);  Surgeon: [place] Dragon, MD;  Location: [facility] ENDOSCOPY;  Service: Endoscopy;  Laterality: N/A;  . ESOPHAGOGASTRODUODENOSCOPY N/A 05/24/2013   Procedure: ESOPHAGOGASTRODUODENOSCOPY (EGD);  Surgeon: Jerene Bears, MD;  Location: [facility];  Service: Gastroenterology;  Laterality: N/A;  . ESOPHAGOGASTRODUODENOSCOPY N/A 10/20/2014   Procedure: ESOPHAGOGASTRODUODENOSCOPY (EGD);  Surgeon: [place] Dragon, MD;  Location: [facility];  Service: Endoscopy;  Laterality: N/A;  .  ESOPHAGOGASTRODUODENOSCOPY N/A 06/18/2017   Procedure: ESOPHAGOGASTRODUODENOSCOPY (EGD) with RFA;  Surgeon: Yetta Flock, MD;  Location: [facility] ENDOSCOPY;  Service: Gastroenterology;  Laterality: N/A;  . EUS  09/2015   antral CAVL, ablated; nodular erosive gastritis; mult pancreatic cysts rec rpt CT pancreas protocol or MRI to survey pancreas cysts 1-2 yrs (Dr Amie Critchley)  . HEMORROIDECTOMY    . HOT HEMOSTASIS  09/10/2012   Procedure: HOT HEMOSTASIS (ARGON PLASMA COAGULATION/BICAP);  Surgeon: [place] Dragon, MD;  Location: [facility];  Service: Endoscopy;  Laterality: N/A;  . HOT HEMOSTASIS N/A 05/24/2013   Procedure: HOT HEMOSTASIS (ARGON PLASMA COAGULATION/BICAP);  Surgeon: Jerene Bears, MD;  Location: [facility];  Service: Gastroenterology;  Laterality: N/A;  . HOT  HEMOSTASIS N/A 10/20/2014   Procedure: HOT HEMOSTASIS (ARGON PLASMA COAGULATION/BICAP);  Surgeon: [place] Dragon, MD;  Location: [facility];  Service: Endoscopy;  Laterality: N/A;  . US ECHOCARDIOGRAPHY  07/2014   mild LVH, EF 60-65%, normal wall motion, mild AR, mod dilated LA, mildly dilated RA, peak PA pressure 66mmHg  . VAGINAL HYSTERECTOMY      SOCIAL HISTORY: Social History   Socioeconomic History  . Marital status: Married    Spouse name: jerry Barton  . Number of children: 4  . Years of education: 13  . Highest education level: Not on file  Social Needs  . Financial resource strain: Not on file  . Food insecurity - worry: Not on file  . Food insecurity - inability: Not on file  . Transportation needs - medical: Not on file  . Transportation needs - non-medical: Not on file  Occupational History  . Occupation: Disabled    Employer: RETIRED  Tobacco Use  . Smoking status: Former Smoker    Packs/day: 1.50    Years: 25.00    Pack years: 37.50    Types: Cigarettes    Last attempt to quit: 08/26/1992    Years since quitting: 25.0  . Smokeless tobacco: Never Used  Substance and Sexual Activity  . Alcohol use: No    Alcohol/week: 0.0 oz    Comment: 04/18/22 "last alcoholic drink was years ago"  . Drug use: No  . Sexual activity: No  Other Topics Concern  . Not on file  Social History Narrative   Patient lives at [facility]. Will be living at [facility] , living in [place], near [facility] as of 06/18/17.   Retired.   Caffeine- None    Right handed.      Dr. Olevia Perches now [facility] (referred to [facility] at [facility])   Dr. Burt Knack cards    Dr. Loanne Drilling endo   Dr Lenna Gilford pulm   Psych: Followed by [facility] (ph [phone], fax [phone]).     FAMILY HISTORY: Family History  Problem Relation Age of Onset  . Heart disease Mother   . Cervical cancer Mother   . Kidney disease Mother   . Diabetes Mother   . Heart attack Mother   . Diabetes Father     . Heart attack Father   . Multiple sclerosis Other   . Breast cancer Sister   . Multiple sclerosis Sister   . Breast cancer Maternal Aunt        x 2  . Diabetes Sister   . Stroke Daughter   . Stroke Daughter   . Lupus Daughter   . Colon cancer Neg Hx     ALLERGIES:  is allergic to acetaminophen; nsaids; aspirin; theophylline; and penicillins.  MEDICATIONS:  Current Outpatient Medications  Medication Sig Dispense Refill  . ADVAIR DISKUS 250-50 MCG/DOSE AEPB INHALE 1 PUFF INTO LUNGS TWICE A DAY 60 each 11  . albuterol (PROVENTIL) (2.5 MG/3ML) 0.083% nebulizer solution Take 2.5 mg by nebulization every 6 (six) hours as needed for wheezing or shortness of breath.    . ALPRAZolam (XANAX) 0.5 MG tablet Take 1 tablet (0.5 mg total) by mouth 2 (two) times daily as needed for anxiety. 5 tablet 0  . atorvastatin (LIPITOR) 20 MG tablet Take 20 mg by mouth daily.     [name] buPROPion (WELLBUTRIN SR) 150 MG 12 hr tablet Take 150 mg by mouth daily.    . Cholecalciferol 1000 units tablet Take 1,000 Units by mouth daily.    [name] dicyclomine (BENTYL) 20 MG tablet Take 1 tablet (20 mg total) by mouth 2 (two) times daily. 20 tablet 0  . Insulin Glargine (LANTUS SOLOSTAR) 100 UNIT/ML Solostar Pen Inject 60 Units into the skin daily at 10 pm.     . insulin lispro (HUMALOG KWIKPEN) 100 UNIT/ML KiwkPen Inject 3 Units into the skin daily before breakfast. Hold if blood sugar is <100    . iron polysaccharides (NIFEREX) 150 MG capsule Take 150 mg by mouth 3 (three) times daily with meals.    . isosorbide mononitrate (IMDUR) 30 MG 24 hr tablet Take 1 tablet (30 mg total) by mouth daily. 30 tablet 6  . lactulose (CHRONULAC) 10 GM/15ML solution Take 10 g by mouth 4 (four) times daily.    [name] levalbuterol (XOPENEX) 0.63 MG/3ML nebulizer solution Take 0.63 mg by nebulization 2 (two) times daily.   5  . magnesium hydroxide (MILK OF MAGNESIA) 400 MG/5ML suspension Take 30 mLs by mouth daily as needed (Constipation. Give if no  BM in 3 days).     . Melatonin 3 MG TABS Take 3 mg by mouth at bedtime.    . Multiple Vitamin (MULTIVITAMIN WITH MINERALS) TABS tablet Take 1 tablet by mouth daily.     . nitroGLYCERIN (NITROSTAT) 0.4 MG SL tablet Place 1 tablet (0.4 mg total) under the tongue every 5 (five) minutes as needed for chest pain. No more than 3 in 15 minutes 25 tablet 1  . oxyCODONE (OXY IR/ROXICODONE) 5 MG immediate release tablet Take 1 tablet (5 mg total) by mouth every 6 (six) hours as needed for moderate pain or severe pain. 5 tablet 0  . OXYGEN Inhale 2 L into the lungs continuous.     . pantoprazole (PROTONIX) 20 MG tablet Take 20 mg by mouth 2 (two) times daily before a meal.     . potassium chloride SA (K-DUR,KLOR-CON) 20 MEQ tablet Take 20 mEq by mouth daily.    . promethazine (PHENERGAN) 12.5 MG tablet Take 12.5 mg by mouth every 12 (twelve) hours.    . Propylene Glycol (SYSTANE BALANCE) 0.6 % SOLN Place 1 drop into both eyes every morning.    [name] QUEtiapine (SEROQUEL) 100 MG tablet Take 100 mg by mouth at bedtime.    . rifaximin (XIFAXAN) 550 MG TABS tablet Take 550 mg by mouth 2 (two) times daily.    [name] rOPINIRole (REQUIP) 4 MG tablet Take 4 mg by mouth at bedtime.    . Skin Protectants, Misc. (EUCERIN) cream Apply 1 application daily topically. Apply to nasal septum bilaterally to prevent epistaxis from oxygen-induced drying     . Sodium Phosphates (RA SALINE ENEMA) 19-7 GM/118ML ENEM Place 1 application rectally daily as needed (Severe constipation). If bisacodyl suppository  doesn't work.    . sucralfate (CARAFATE) 1 GM/10ML suspension Take 1 g by mouth every 6 (six) hours as needed (ulcer).     . torsemide (DEMADEX) 20 MG tablet Take 40 mg by mouth daily.    . traMADol (ULTRAM) 50 MG tablet Take 1 tablet (50 mg total) by mouth every 12 (twelve) hours as needed for moderate pain. Not to be given with Xanax. 5 tablet 0  . tranexamic acid (LYSTEDA) 650 MG TABS tablet Take 2 tablets (1,300 mg total) by mouth 2  (two) times daily. 60 tablet 1  . valproic acid (DEPAKENE) 250 MG capsule Take 250 mg by mouth 2 (two) times daily.    . Darbepoetin Alfa (ARANESP) 100 MCG/0.5ML SOSY injection Inject 100 mcg every 14 (fourteen) days into the skin.     No current facility-administered medications for this visit.     REVIEW OF SYSTEMS:    A 10+ POINT REVIEW OF SYSTEMS WAS OBTAINED including neurology, dermatology, psychiatry, cardiac, respiratory, lymph, extremities, GI, GU, Musculoskeletal, constitutional, breasts, reproductive, HEENT.  All pertinent positives are noted in the HPI.  All others are negative.  PHYSICAL EXAMINATION:  ECOG PERFORMANCE STATUS: 2 - Symptomatic, <50% confined to bed  . Vitals:   09/08/17 0848  BP: (!) 144/57  Pulse: 70  Resp: (!) 28  Temp: 98.6 F (37 C)  SpO2: 94%   Filed Weights   09/08/17 0848  Weight: 198 lb 8 oz (90 kg)   .Body mass index is 35.73 kg/m.  GENERAL:alert, in no acute distress and comfortable in a wheelchair. Nasal canula is in place and administering oxygen at appropriate amount of 2L/min.  SKIN: no acute rashes, no significant lesions. Pallor is noted diffusely.  EYES: Right conjunctiva is pink and non-injected, sclera anicteric. Subconjunctival hemorrhage is noted within the left eye.  OROPHARYNX: MMM, no exudates, no oropharyngeal erythema or ulceration NECK: supple, no JVD LYMPH:  no palpable lymphadenopathy in the cervical, axillary or inguinal regions LUNGS: clear to auscultation b/l with normal respiratory effort HEART: regular rate & rhythm ABDOMEN:  normoactive bowel sounds , non tender, not distended. Extremity: no pedal edema PSYCH: alert & oriented x 3 with fluent speech NEURO: no focal motor/sensory deficits  LABORATORY DATA:  I have reviewed the data as listed  . CBC Latest Ref Rng & Units 09/08/2017 08/25/2017 08/25/2017  WBC 3.9 - 10.3 10e3/uL - 3.9 2.5  Hemoglobin 11.6 - 15.9 g/dL - 7.4(L) 6.8(A)  Hematocrit 34.8 - 46.6 %  23.3(L) 23.4(L) 20(A)  Platelets 145 - 400 10e3/uL - 78(L) 73(A)   . CMP Latest Ref Rng & Units 09/08/2017 08/25/2017 08/25/2017  Glucose 70 - 140 mg/dL 93 - -  BUN 7 - 26 mg/dL 39(H) 36(A) 36(A)  Creatinine 0.60 - 1.10 mg/dL 1.50(H) 1.5(A) 1.5(A)  Sodium 136 - 145 mmol/L 144 144 144  Potassium 3.3 - 4.7 mmol/L 3.8 3.4 3.4  Chloride 98 - 109 mmol/L 107 - -  CO2 22 - 29 mmol/L 27 - -  Calcium 8.4 - 10.4 mg/dL 8.0(L) - -  Total Protein 6.4 - 8.3 g/dL 4.6(L) - -  Total Bilirubin 0.2 - 1.2 mg/dL 0.6 - -  Alkaline Phos 40 - 150 U/L 107 118 118  AST 5 - 34 U/L 32 37(A) 37(A)  ALT 0 - 55 U/L 23 26 26    Component     Latest Ref Rng & Units 09/08/2017  WBC Count     3.9 - 10.3 K/uL 3.3 (L)  RBC     3.70 - 5.45 MIL/uL 2.28 (L)  Hemoglobin     11.6 - 15.9 g/dL 7.2 (L)  HCT     34.8 - 46.6 % 23.3 (L)  MCV     79.5 - 101.0 fL 102.2 (H)  MCH     25.1 - 34.0 pg 31.6  MCHC     31.5 - 36.0 g/dL 30.9 (L)  RDW     11.2 - 16.1 % 19.7 (H)  Platelet Count     145 - 400 K/uL 75 (L)  Neutrophils     % 70  NEUT#     1.5 - 6.5 K/uL 2.3  Lymphocytes     % 12  Lymphocyte #     0.9 - 3.3 K/uL 0.4 (L)  Monocytes Relative     % 11  Monocyte #     0.1 - 0.9 K/uL 0.4  Eosinophil     % 6  Eosinophils Absolute     0.0 - 0.5 K/uL 0.2  Basophil     % 1  Basophils Absolute     0.0 - 0.1 K/uL 0.0  Retic Ct Pct     0.7 - 2.1 % 6.5 (H)  RBC.     3.70 - 5.45 MIL/uL 2.28 (L)  Retic Count, Absolute     33.7 - 90.7 K/uL 148.2 (H)   . Lab Results  Component Value Date   IRON 34 06/26/2017   TIBC 343 06/26/2017   IRONPCTSAT 10 (L) 06/26/2017   (Iron and TIBC)  Lab Results  Component Value Date   FERRITIN 179 08/25/2017     RADIOGRAPHIC STUDIES: I have personally reviewed the radiological images as listed and agreed with the findings in the report. No results found.  ASSESSMENT & PLAN:  This is a very pleasant caucasian 77 y.o. female who presents into the clinic today to discuss:    1.) Symptomatic anemia with thrombocytopenia. -Her anemia is primarily caused by her GI bleeding from her GAVE, in combination with splenomegaly and liver cirrhosis.  Thrombocytopenia and mild coagulopathy from cirrhosis worsening GI bleeding. 2) Thrombocytopenia Platelet 75k  -There is likely some platelet sequestration secondary to her splenomegaly which is likely why she has some issues with thrombocytopenia.   3) significant weight gain due to fluid retention from her diastolic CHF, chronic kidney disease and transfusions.  17 pounds up . Wt Readings from Last 3 Encounters:  09/08/17 198 lb 8 oz (90 kg)  08/11/17 181 lb (82.1 kg)  08/11/17 181 lb 4.8 oz (82.2 kg)   4) Ongoing UGI bleeding from GAVE's PLAN --Patient continues to have active melena.  Last PRBC transfusions on 08/27/2016. -hgb back down to 7.2 today and patient is symptomatic and having active melena.  -Discussed ED visit versus outpatient transfusion and the patient prefers the latter and prefers to avoid ED visit and hospitalization patient understands that she could have overwhelming GI bleeding that would not be managed with outpatient transfusions. -She notes that she would like to continue transfusions until she has time to meet her son and then will decide what to do next. -Continue Lysteda Transfused 2 units of PRBCs today-with Lasix support -Continue follow-up with primary care physician for diuretics management. -if active bleeding might need to consider GI bleed study and possible consideration of directed IR angio-embolization. -if Platelets < 50k could consider Nplate based on goals of care. -They are currently in talks with palliative care & hospice. The patient is currently  considering her options with this.  -Advised and discussed with the patient and her daughters regarding a blood transfusion today.  -Advised and discussed with the patient and her daughters that use of Iron transfusions wouldn't be  viable at this time, due to the patient's extent/rapidity of bleeding.  -Will order lasix to be included along with blood transfusion today to aid with fluid retention.   -Advised with the patient and her daughters that if the patient is short of breath due to fluid overload, then the next steps would be hospitalization to further manage her symptoms.  -Advised that we will try and manage her symptoms in the outpatient setting and if there is fluid overload that the patient will then need to be admitted to the hospital for further management.    Plan:  -PRBC transfusion x 2 units today -Labs q 2 weeks and PRBC transfusion as needed -RTC with Dr. Irene Limbo in 4 weeks.    All of the patients questions were answered with apparent satisfaction. The patient knows to call the clinic with any problems, questions or concerns.  I spent 20 minutes counseling the patient face to face. The total time spent in the appointment was 25 minutes and more than 50% was on counseling and direct patient cares.  Sullivan Lone MD [place] AAHIVMS [facility] Hematology/Oncology Physician [facility]  (Office):       [phone] (Work cell):  [phone] (Fax):           [phone]  09/08/2017 9:22 AM  This document serves as a record of services personally performed by Sullivan Lone, MD. It was created on his behalf by Steva Colder, a trained medical scribe. The creation of this record is based on the scribe's personal observations and the provider's statements to them.   .I have reviewed the above documentation for accuracy and completeness, and I agree with the above. Brunetta Genera MD MS

## 2017-09-08 ENCOUNTER — Encounter: Payer: Self-pay | Admitting: Hematology

## 2017-09-08 ENCOUNTER — Other Ambulatory Visit: Payer: Self-pay

## 2017-09-08 ENCOUNTER — Inpatient Hospital Stay: Payer: Medicare Other

## 2017-09-08 ENCOUNTER — Inpatient Hospital Stay: Payer: Medicare Other | Attending: Hematology | Admitting: Hematology

## 2017-09-08 VITALS — BP 144/57 | HR 70 | Temp 98.6°F | Resp 28 | Ht 62.5 in | Wt 198.5 lb

## 2017-09-08 DIAGNOSIS — D684 Acquired coagulation factor deficiency: Secondary | ICD-10-CM | POA: Diagnosis not present

## 2017-09-08 DIAGNOSIS — E1122 Type 2 diabetes mellitus with diabetic chronic kidney disease: Secondary | ICD-10-CM | POA: Insufficient documentation

## 2017-09-08 DIAGNOSIS — R161 Splenomegaly, not elsewhere classified: Secondary | ICD-10-CM | POA: Diagnosis not present

## 2017-09-08 DIAGNOSIS — D696 Thrombocytopenia, unspecified: Secondary | ICD-10-CM | POA: Diagnosis not present

## 2017-09-08 DIAGNOSIS — Z87891 Personal history of nicotine dependence: Secondary | ICD-10-CM | POA: Diagnosis not present

## 2017-09-08 DIAGNOSIS — Z87440 Personal history of urinary (tract) infections: Secondary | ICD-10-CM | POA: Diagnosis not present

## 2017-09-08 DIAGNOSIS — D649 Anemia, unspecified: Secondary | ICD-10-CM

## 2017-09-08 DIAGNOSIS — I2510 Atherosclerotic heart disease of native coronary artery without angina pectoris: Secondary | ICD-10-CM | POA: Insufficient documentation

## 2017-09-08 DIAGNOSIS — G4733 Obstructive sleep apnea (adult) (pediatric): Secondary | ICD-10-CM | POA: Diagnosis not present

## 2017-09-08 DIAGNOSIS — K922 Gastrointestinal hemorrhage, unspecified: Secondary | ICD-10-CM

## 2017-09-08 DIAGNOSIS — G5603 Carpal tunnel syndrome, bilateral upper limbs: Secondary | ICD-10-CM | POA: Insufficient documentation

## 2017-09-08 DIAGNOSIS — N189 Chronic kidney disease, unspecified: Secondary | ICD-10-CM | POA: Insufficient documentation

## 2017-09-08 DIAGNOSIS — I130 Hypertensive heart and chronic kidney disease with heart failure and stage 1 through stage 4 chronic kidney disease, or unspecified chronic kidney disease: Secondary | ICD-10-CM | POA: Insufficient documentation

## 2017-09-08 DIAGNOSIS — I5032 Chronic diastolic (congestive) heart failure: Secondary | ICD-10-CM | POA: Diagnosis not present

## 2017-09-08 DIAGNOSIS — Z79899 Other long term (current) drug therapy: Secondary | ICD-10-CM | POA: Insufficient documentation

## 2017-09-08 DIAGNOSIS — K921 Melena: Secondary | ICD-10-CM | POA: Insufficient documentation

## 2017-09-08 DIAGNOSIS — D500 Iron deficiency anemia secondary to blood loss (chronic): Secondary | ICD-10-CM | POA: Insufficient documentation

## 2017-09-08 DIAGNOSIS — E785 Hyperlipidemia, unspecified: Secondary | ICD-10-CM | POA: Diagnosis not present

## 2017-09-08 DIAGNOSIS — K31811 Angiodysplasia of stomach and duodenum with bleeding: Secondary | ICD-10-CM | POA: Insufficient documentation

## 2017-09-08 DIAGNOSIS — F209 Schizophrenia, unspecified: Secondary | ICD-10-CM | POA: Insufficient documentation

## 2017-09-08 DIAGNOSIS — K219 Gastro-esophageal reflux disease without esophagitis: Secondary | ICD-10-CM | POA: Insufficient documentation

## 2017-09-08 DIAGNOSIS — K449 Diaphragmatic hernia without obstruction or gangrene: Secondary | ICD-10-CM | POA: Insufficient documentation

## 2017-09-08 DIAGNOSIS — K7460 Unspecified cirrhosis of liver: Secondary | ICD-10-CM | POA: Insufficient documentation

## 2017-09-08 DIAGNOSIS — F419 Anxiety disorder, unspecified: Secondary | ICD-10-CM | POA: Insufficient documentation

## 2017-09-08 DIAGNOSIS — Z794 Long term (current) use of insulin: Secondary | ICD-10-CM | POA: Insufficient documentation

## 2017-09-08 DIAGNOSIS — J449 Chronic obstructive pulmonary disease, unspecified: Secondary | ICD-10-CM | POA: Diagnosis not present

## 2017-09-08 DIAGNOSIS — K31819 Angiodysplasia of stomach and duodenum without bleeding: Secondary | ICD-10-CM

## 2017-09-08 LAB — CBC WITH DIFFERENTIAL (CANCER CENTER ONLY)
BASOS ABS: 0 10*3/uL (ref 0.0–0.1)
BASOS PCT: 1 %
EOS ABS: 0.2 10*3/uL (ref 0.0–0.5)
EOS PCT: 6 %
HCT: 23.3 % — ABNORMAL LOW (ref 34.8–46.6)
HEMOGLOBIN: 7.2 g/dL — AB (ref 11.6–15.9)
Lymphocytes Relative: 12 %
Lymphs Abs: 0.4 10*3/uL — ABNORMAL LOW (ref 0.9–3.3)
MCH: 31.6 pg (ref 25.1–34.0)
MCHC: 30.9 g/dL — ABNORMAL LOW (ref 31.5–36.0)
MCV: 102.2 fL — ABNORMAL HIGH (ref 79.5–101.0)
Monocytes Absolute: 0.4 10*3/uL (ref 0.1–0.9)
Monocytes Relative: 11 %
NEUTROS PCT: 70 %
Neutro Abs: 2.3 10*3/uL (ref 1.5–6.5)
PLATELETS: 75 10*3/uL — AB (ref 145–400)
RBC: 2.28 MIL/uL — AB (ref 3.70–5.45)
RDW: 19.7 % — ABNORMAL HIGH (ref 11.2–16.1)
WBC: 3.3 10*3/uL — AB (ref 3.9–10.3)

## 2017-09-08 LAB — COMPREHENSIVE METABOLIC PANEL
ALT: 23 U/L (ref 0–55)
AST: 32 U/L (ref 5–34)
Albumin: 2.5 g/dL — ABNORMAL LOW (ref 3.5–5.0)
Alkaline Phosphatase: 107 U/L (ref 40–150)
Anion gap: 10 (ref 3–11)
BUN: 39 mg/dL — ABNORMAL HIGH (ref 7–26)
CHLORIDE: 107 mmol/L (ref 98–109)
CO2: 27 mmol/L (ref 22–29)
Calcium: 8 mg/dL — ABNORMAL LOW (ref 8.4–10.4)
Creatinine, Ser: 1.5 mg/dL — ABNORMAL HIGH (ref 0.60–1.10)
GFR calc Af Amer: 38 mL/min — ABNORMAL LOW (ref >60)
GFR, EST NON AFRICAN AMERICAN: 33 mL/min — AB (ref >60)
Glucose, Bld: 93 mg/dL (ref 70–140)
POTASSIUM: 3.8 mmol/L (ref 3.3–4.7)
SODIUM: 144 mmol/L (ref 136–145)
Total Bilirubin: 0.6 mg/dL (ref 0.2–1.2)
Total Protein: 4.6 g/dL — ABNORMAL LOW (ref 6.4–8.3)

## 2017-09-08 LAB — RETICULOCYTES
RBC.: 2.28 MIL/uL — AB (ref 3.70–5.45)
RETIC COUNT ABSOLUTE: 148.2 10*3/uL — AB (ref 33.7–90.7)
RETIC CT PCT: 6.5 % — AB (ref 0.7–2.1)

## 2017-09-08 LAB — PREPARE RBC (CROSSMATCH)

## 2017-09-08 MED ORDER — DIPHENHYDRAMINE HCL 25 MG PO CAPS
25.0000 mg | ORAL_CAPSULE | Freq: Once | ORAL | Status: AC
  Administered 2017-09-08: 25 mg via ORAL

## 2017-09-08 MED ORDER — ACETAMINOPHEN 325 MG PO TABS: 650.0000 mg | ORAL_TABLET | Freq: Once | ORAL | Status: DC

## 2017-09-08 MED ORDER — SODIUM CHLORIDE 0.9 % IV SOLN
250.0000 mL | Freq: Once | INTRAVENOUS | Status: AC
  Administered 2017-09-08: 250 mL via INTRAVENOUS

## 2017-09-08 MED ORDER — FUROSEMIDE 10 MG/ML IJ SOLN
20.0000 mg | Freq: Once | INTRAMUSCULAR | Status: AC
  Administered 2017-09-08: 20 mg via INTRAVENOUS

## 2017-09-08 MED ORDER — FUROSEMIDE 10 MG/ML IJ SOLN: 20.0000 mg | Freq: Once | INTRAMUSCULAR | Status: DC

## 2017-09-08 NOTE — Patient Instructions (Signed)

## 2017-09-09 ENCOUNTER — Other Ambulatory Visit: Payer: Self-pay

## 2017-09-09 ENCOUNTER — Inpatient Hospital Stay: Payer: Medicare Other

## 2017-09-09 ENCOUNTER — Encounter: Payer: Self-pay | Admitting: General Practice

## 2017-09-09 VITALS — BP 156/56 | HR 62 | Temp 98.0°F | Resp 20

## 2017-09-09 DIAGNOSIS — D500 Iron deficiency anemia secondary to blood loss (chronic): Secondary | ICD-10-CM | POA: Diagnosis not present

## 2017-09-09 DIAGNOSIS — D649 Anemia, unspecified: Secondary | ICD-10-CM

## 2017-09-09 DIAGNOSIS — K922 Gastrointestinal hemorrhage, unspecified: Secondary | ICD-10-CM

## 2017-09-09 LAB — PREPARE RBC (CROSSMATCH)

## 2017-09-09 MED ORDER — SODIUM CHLORIDE 0.9 % IV SOLN
250.0000 mL | Freq: Once | INTRAVENOUS | Status: AC
  Administered 2017-09-09: 250 mL via INTRAVENOUS

## 2017-09-09 MED ORDER — DIPHENHYDRAMINE HCL 25 MG PO CAPS
25.0000 mg | ORAL_CAPSULE | Freq: Once | ORAL | Status: AC
  Administered 2017-09-09: 25 mg via ORAL

## 2017-09-09 NOTE — Progress Notes (Signed)
[place] Spiritual Care Note  Referred by Luellen Pucker for spiritual and emotional support for [name] and dtr [name] in infusion, as [name] is in discernment about discontinuing transfusions and planning for EOL. Provided empathic listening, emotional support, pastoral reflection, and assistance with naming GOC and QOL needs.  Per pt, her next two goals are to spend time with her son while he visits from [place] this weekend and to see her husband again this week (he lives in a memory care facility in [place] due to hx TBI and other needs). [name] reports good medical, social, and discernment support from [facility], her NP at [facility]. Dtr [name] is a key emotional and logistical supporter, as well as HCPOA.   Ms Stock is seriously considering discontinuing transfusions after she meets these family-visit goals. Per pt, she has her funeral planned and is clear that she would like to die sleeping; she verbalizes anxiety about dying due to difficulty breathing and names her hope to have continued medication help to stay calm and to rest peacefully. She knows that hospice can help her with comfort care when she decides to stop treatment.  Both [name] and [name] are aware of ongoing chaplain availability. Provided a prayer shawl for [name] and a pt/caregiver support tote bag to encourage self-care.  Encouraged [name] in particular to phone anytime for additional support as she manages caregiver distress, anticipatory grief, and myriad family logistics on top of her job and her own emotional needs. Family verbalized gratitude.    [place], [place], [facility] Pager [phone] Voicemail [phone]

## 2017-09-09 NOTE — Patient Instructions (Signed)

## 2017-09-10 LAB — BPAM RBC
BLOOD PRODUCT EXPIRATION DATE: 201902012359
BLOOD PRODUCT EXPIRATION DATE: 201902112359
ISSUE DATE / TIME: 201901141206
ISSUE DATE / TIME: 201901150959
UNIT TYPE AND RH: 5100
UNIT TYPE AND RH: 5100

## 2017-09-10 LAB — TYPE AND SCREEN
ABO/RH(D): O POS
ANTIBODY SCREEN: NEGATIVE
Donor AG Type: NEGATIVE
Donor AG Type: NEGATIVE
UNIT DIVISION: 0
Unit division: 0

## 2017-09-15 ENCOUNTER — Telehealth: Payer: Self-pay | Admitting: Hematology

## 2017-09-15 NOTE — Telephone Encounter (Signed)
Patient called to cancel all appointment she doesn't want treatment anymore

## 2017-09-17 ENCOUNTER — Encounter: Payer: Self-pay | Admitting: Adult Health

## 2017-09-17 ENCOUNTER — Non-Acute Institutional Stay (SKILLED_NURSING_FACILITY): Payer: Medicare Other | Admitting: Adult Health

## 2017-09-17 DIAGNOSIS — F419 Anxiety disorder, unspecified: Secondary | ICD-10-CM

## 2017-09-17 DIAGNOSIS — K31819 Angiodysplasia of stomach and duodenum without bleeding: Secondary | ICD-10-CM

## 2017-09-17 DIAGNOSIS — H04123 Dry eye syndrome of bilateral lacrimal glands: Secondary | ICD-10-CM

## 2017-09-17 DIAGNOSIS — F39 Unspecified mood [affective] disorder: Secondary | ICD-10-CM

## 2017-09-17 DIAGNOSIS — F29 Unspecified psychosis not due to a substance or known physiological condition: Secondary | ICD-10-CM | POA: Diagnosis not present

## 2017-09-17 DIAGNOSIS — R110 Nausea: Secondary | ICD-10-CM

## 2017-09-17 DIAGNOSIS — J449 Chronic obstructive pulmonary disease, unspecified: Secondary | ICD-10-CM

## 2017-09-17 DIAGNOSIS — D500 Iron deficiency anemia secondary to blood loss (chronic): Secondary | ICD-10-CM

## 2017-09-17 DIAGNOSIS — I5032 Chronic diastolic (congestive) heart failure: Secondary | ICD-10-CM

## 2017-09-17 DIAGNOSIS — G8929 Other chronic pain: Secondary | ICD-10-CM

## 2017-09-17 DIAGNOSIS — Z794 Long term (current) use of insulin: Secondary | ICD-10-CM

## 2017-09-17 DIAGNOSIS — IMO0001 Reserved for inherently not codable concepts without codable children: Secondary | ICD-10-CM

## 2017-09-17 DIAGNOSIS — E119 Type 2 diabetes mellitus without complications: Secondary | ICD-10-CM

## 2017-09-22 ENCOUNTER — Other Ambulatory Visit: Payer: Medicare Other

## 2017-09-26 NOTE — Progress Notes (Signed)
Location:  [facility] Room Number: 201-B Place of Service:  SNF (31) Provider:  Durenda Age, NP  Patient Care Team: Hendricks Limes, MD as PCP - General (Internal Medicine)  Extended Emergency Contact Information Primary Emergency Contact: Bushnoe,Penny Address: [address]          [place], [place] [address] [place] of [place] Phone: [phone] Mobile Phone: [phone] Relation: Daughter Secondary Emergency Contact: Jen Mow, [place] [address] [place] Phone: [phone] Relation: Sister  Code Status:  DNR  Goals of care: Advanced Directive information Advanced Directives 08-Oct-2017  Does Patient Have a Medical Advance Directive? Yes  Type of Advance Directive Out of facility DNR (pink MOST or yellow form)  Does patient want to make changes to medical advance directive? No - Patient declined  Copy of [place] in Chart? -  Would patient like information on creating a medical advance directive? -  Pre-existing out of facility DNR order (yellow form or pink MOST form) -     Chief Complaint  Patient presents with  . Medical Management of Chronic Issues    Routine [facility] visit    HPI:  Pt is a 77 y.o. female seen today for medical management of chronic diseases. She is a long-term care resident of [facility].  She is also being followed by [facility] for end of life issues.  She has a PMH of liver cirrhosis, GAVE, COPD, IDDM, bipolar disorder, and depression. She has been hospitalized multiple times to get blood transfusions. Patient and family has decided patient to have comfort care - no labs, hospitalizations, transfusions. She was seen rocking her upper extremity and putting her arms up and down. She said that she "wanted to go." Her daughters and  Sister at bedside telling her, "You can go now, Mom. It is OK to go." Hospice nurse at bedside, as  well. She was given Haldol 5 mg earlier but she kept on rocking her head and torso. Daughter has been holding her for safety. She complained that she is nauseous but has not vomited. Medications were evaluated with hospice nurse and discontinued Lantus, Advair, Bupropion, Isosorbide MN, Torsemide, Melatonin, Seroquel and Proctozone.     Past Medical History:  Diagnosis Date  . Adenomatous colon polyp   . Anxiety   . Asthma   . Back pain   . Benign neoplasm of colon   . CAP (community acquired pneumonia) 2015  . Carpal tunnel syndrome on both sides   . Chronic airway obstruction, not elsewhere classified   . Chronic diastolic CHF (congestive heart failure) ([place])    a. 03/26/2014 Echo: EF 55-60%, no rwma, mild AS/AI, mod-sev Ca2+ MV annulus, mildly to mod dil LA.  [name] Coarse tremors    a. arms.  . Diverticulosis of colon (without mention of hemorrhage)   . Esophageal reflux   . Falls frequently    completed HHPT/OT 06/2014, PT unmet goals  . GAVE (gastric antral vascular ectasia)    angiodysplasia; s/p multiple APCs, discussing RFA with [facility] GI Dr Newman Pies (06/2015)  . H/O hiatal hernia   . Hepatitis   . Hiatal hernia   . Hyperlipidemia   . Iron deficiency anemia secondary to blood loss (chronic)    a. frequent PRBC transfusions.  . Liver cirrhosis secondary to nonalcoholic steatohepatitis (NASH) ([place])    a. dx'd 1990's  . Major depressive disorder, recurrent episode,  severe, specified as with psychotic behavior   . Memory loss   . Midsternal chest pain    a. conservatively managed ->poor candidate for cath/anticoagulation.  . Mild aortic stenosis    a. 03/2014 Valve area (VTI): 2.89 cm^2, Valve area (Vmax): 2.7 cm^2.  . Morbid obesity ([place])   . Neck pain   . Non-obstructive CAD   . On home oxygen therapy    "3L; 24/7" (11/29/2015)  . OSA (obstructive sleep apnea)    a. does not use CPAP. (01/25/2015)  . Osteoarthrosis, unspecified whether generalized or localized, unspecified  site   . Osteoporosis, unspecified   . Palpitations   . Portal hypertensive gastropathy ([place])   . Protein calorie malnutrition ([place])   . Recurrent UTI (urinary tract infection)    h/o hospitalization with urosepsis 2015, but thought large component colonization/bacteriuria, only treat if symptomatic ([place])  . Restless legs syndrome (RLS)   . Schizophrenia ([place])   . Thrombocytopenia ([place])   . Type II diabetes mellitus ([place])   . Unspecified chronic bronchitis ([place])   . Unspecified essential hypertension    Past Surgical History:  Procedure Laterality Date  . APPENDECTOMY    . BALLOON DILATION  06/12/2012   Procedure: BALLOON DILATION;  Surgeon: Lafayette Dragon, MD;  Location: [facility] ENDOSCOPY;  Service: Endoscopy;  Laterality: N/A;  ?balloon  . BREAST BIOPSY     bilateral  . CESAREAN SECTION     x 3  . ENTEROSCOPY N/A 02/25/2015   Procedure: ENTEROSCOPY;  Surgeon: [name] Ada, MD;  Location: [facility] ENDOSCOPY;  Service: Endoscopy;  Laterality: N/A;  . ENTEROSCOPY N/A 06/27/2015   Procedure: ENTEROSCOPY;  Surgeon: Manus Gunning, MD;  Location: [facility] ENDOSCOPY;  Service: Gastroenterology;  Laterality: N/A;  . ESOPHAGOGASTRODUODENOSCOPY  06/12/2012   Procedure: ESOPHAGOGASTRODUODENOSCOPY (EGD);  Surgeon: Lafayette Dragon, MD;  Location: [facility];  Service: Endoscopy;  Laterality: N/A;  . ESOPHAGOGASTRODUODENOSCOPY  09/10/2012   Procedure: ESOPHAGOGASTRODUODENOSCOPY (EGD);  Surgeon: Lafayette Dragon, MD;  Location: [facility];  Service: Endoscopy;  Laterality: N/A;  . ESOPHAGOGASTRODUODENOSCOPY N/A 01/18/2013   Procedure: ESOPHAGOGASTRODUODENOSCOPY (EGD);  Surgeon: Lafayette Dragon, MD;  Location: [facility] ENDOSCOPY;  Service: Endoscopy;  Laterality: N/A;  . ESOPHAGOGASTRODUODENOSCOPY N/A 05/24/2013   Procedure: ESOPHAGOGASTRODUODENOSCOPY (EGD);  Surgeon: Jerene Bears, MD;  Location: [place];  Service: Gastroenterology;  Laterality: N/A;  . ESOPHAGOGASTRODUODENOSCOPY N/A 10/20/2014   Procedure:  ESOPHAGOGASTRODUODENOSCOPY (EGD);  Surgeon: Lafayette Dragon, MD;  Location: [facility];  Service: Endoscopy;  Laterality: N/A;  . ESOPHAGOGASTRODUODENOSCOPY N/A 06/18/2017   Procedure: ESOPHAGOGASTRODUODENOSCOPY (EGD) with RFA;  Surgeon: Yetta Flock, MD;  Location: [facility] ENDOSCOPY;  Service: Gastroenterology;  Laterality: N/A;  . EUS  09/2015   antral CAVL, ablated; nodular erosive gastritis; mult pancreatic cysts rec rpt CT pancreas protocol or MRI to survey pancreas cysts 1-2 yrs (Dr Amie Critchley)  . HEMORROIDECTOMY    . HOT HEMOSTASIS  09/10/2012   Procedure: HOT HEMOSTASIS (ARGON PLASMA COAGULATION/BICAP);  Surgeon: Lafayette Dragon, MD;  Location: [facility];  Service: Endoscopy;  Laterality: N/A;  . HOT HEMOSTASIS N/A 05/24/2013   Procedure: HOT HEMOSTASIS (ARGON PLASMA COAGULATION/BICAP);  Surgeon: Jerene Bears, MD;  Location: [facility];  Service: Gastroenterology;  Laterality: N/A;  . HOT HEMOSTASIS N/A 10/20/2014   Procedure: HOT HEMOSTASIS (ARGON PLASMA COAGULATION/BICAP);  Surgeon: Lafayette Dragon, MD;  Location: [facility];  Service: Endoscopy;  Laterality: N/A;  . US ECHOCARDIOGRAPHY  07/2014   mild LVH, EF 60-65%, normal wall motion,  mild AR, mod dilated LA, mildly dilated RA, peak PA pressure 87mmHg  . VAGINAL HYSTERECTOMY      Allergies  Allergen Reactions  . Acetaminophen Other (See Comments)    Liver dysfunction  . Nsaids Other (See Comments)    Liver dysfunction   . Aspirin Other (See Comments)    Causes nosebleeds  . Theophylline Nausea And Vomiting  . Penicillins Itching and Other (See Comments)    Has patient had a PCN reaction causing immediate rash, facial/tongue/throat swelling, SOB or lightheadedness with hypotension: unknown Has patient had a PCN reaction causing severe rash involving mucus membranes or skin necrosis: unknown Has patient had a PCN reaction that required hospitalization unknown Has patient had a PCN reaction occurring within the last 10  years: yes If all of the above answers are "NO", then may proceed with Cephalosporin use.     Outpatient Encounter Medications as of 27-Sep-2017  Medication Sig  . ALPRAZolam (XANAX) 1 MG tablet Take 1 mg by mouth every 6 (six) hours.  [name] atropine 1 % ophthalmic ointment 1 application every 3 (three) hours as needed (Excessive secretions secondary to end of life). Take orally  . chlorproMAZINE (THORAZINE) 50 MG tablet Take 50 mg by mouth every 6 (six) hours as needed.  . dicyclomine (BENTYL) 20 MG tablet Take 1 tablet (20 mg total) by mouth 2 (two) times daily.  . diphenhydrAMINE (BENADRYL) 25 MG tablet Take 25 mg by mouth every 4 (four) hours as needed.  [name] guaifenesin (HUMIBID E) 400 MG TABS tablet Take 400 mg by mouth every 4 (four) hours as needed (Cough).  . haloperidol (HALDOL) 5 MG tablet Take 5 mg by mouth every 6 (six) hours as needed for agitation.  [name] HALOPERIDOL PO Take 2.5 mLs by mouth once. Restlessness/terminal agitation.  Given at 11 a.m.  [name] ipratropium-albuterol (DUONEB) 0.5-2.5 (3) MG/3ML SOLN Take 3 mLs by nebulization 3 (three) times daily.  [name] LORazepam (ATIVAN) 0.5 MG tablet Take 0.5 mg by mouth 3 (three) times daily as needed for anxiety.  . magnesium hydroxide (MILK OF MAGNESIA) 400 MG/5ML suspension Take 30 mLs by mouth daily as needed (Constipation. Give if no BM in 3 days).   . Menthol 1.7 MG LOZG Use as directed 1 each in the mouth or throat every 2 (two) hours as needed (Hoarseness/sore throat).  [name] morphine (ROXANOL) 20 MG/ML concentrated solution Take 20 mg by mouth every 2 (two) hours as needed for severe pain or shortness of breath (Comfort care/end of life medication).  . morphine (ROXANOL) 20 MG/ML concentrated solution Take 10 mg by mouth every 2 (two) hours as needed for moderate pain or shortness of breath (Comfort care/end of life medication).  . morphine (ROXANOL) 20 MG/ML concentrated solution Take 5 mg by mouth every 2 (two) hours as needed for shortness of  breath (Mild pain, comfort care).  . nitroGLYCERIN (NITROSTAT) 0.4 MG SL tablet Place 1 tablet (0.4 mg total) under the tongue every 5 (five) minutes as needed for chest pain. No more than 3 in 15 minutes  . nystatin (MYCOSTATIN/NYSTOP) powder Apply topically 3 (three) times daily. Apply to groin area under abdominal fold  . OXYGEN Inhale 2 L into the lungs continuous.   . pantoprazole (PROTONIX) 20 MG tablet Take 20 mg by mouth 2 (two) times daily before a meal.   . promethazine (PHENERGAN) 12.5 MG tablet Take 12.5 mg by mouth every 12 (twelve) hours.  . Propylene Glycol (SYSTANE BALANCE) 0.6 % SOLN Place 1  drop into both eyes every morning.  . Skin Protectants, Misc. (EUCERIN) cream Apply 1 application daily topically. Apply to nasal septum bilaterally to prevent epistaxis from oxygen-induced drying   . Sodium Phosphates (RA SALINE ENEMA) 19-7 GM/118ML ENEM Place 1 application rectally daily as needed (Severe constipation). If bisacodyl suppository doesn't work.  . sucralfate (CARAFATE) 1 GM/10ML suspension Take 1 g by mouth every 6 (six) hours as needed (ulcer).   . traMADol (ULTRAM) 50 MG tablet Take 50 mg by mouth daily.  [name] valproic acid (DEPAKENE) 250 MG capsule Take 250 mg by mouth daily.   . [DISCONTINUED] hydrocortisone (ANUSOL-HC) 2.5 % rectal cream Place 1 application rectally 3 (three) times daily.  . [DISCONTINUED] ADVAIR DISKUS 250-50 MCG/DOSE AEPB INHALE 1 PUFF INTO LUNGS TWICE A DAY  . [DISCONTINUED] albuterol (PROVENTIL) (2.5 MG/3ML) 0.083% nebulizer solution Take 2.5 mg by nebulization every 6 (six) hours as needed for wheezing or shortness of breath.  . [DISCONTINUED] ALPRAZolam (XANAX) 0.5 MG tablet Take 1 tablet (0.5 mg total) by mouth 2 (two) times daily as needed for anxiety.  . [DISCONTINUED] atorvastatin (LIPITOR) 20 MG tablet Take 20 mg by mouth daily.   . [DISCONTINUED] buPROPion (WELLBUTRIN SR) 150 MG 12 hr tablet Take 150 mg by mouth daily.  . [DISCONTINUED]  Cholecalciferol 1000 units tablet Take 1,000 Units by mouth daily.  . [DISCONTINUED] Darbepoetin Alfa (ARANESP) 100 MCG/0.5ML SOSY injection Inject 100 mcg every 14 (fourteen) days into the skin.  . [DISCONTINUED] Insulin Glargine (LANTUS SOLOSTAR) 100 UNIT/ML Solostar Pen Inject 60 Units into the skin daily at 10 pm.   . [DISCONTINUED] insulin lispro (HUMALOG KWIKPEN) 100 UNIT/ML KiwkPen Inject 3 Units into the skin daily before breakfast. Hold if blood sugar is <100  . [DISCONTINUED] iron polysaccharides (NIFEREX) 150 MG capsule Take 150 mg by mouth 3 (three) times daily with meals.  . [DISCONTINUED] isosorbide mononitrate (IMDUR) 30 MG 24 hr tablet Take 1 tablet (30 mg total) by mouth daily.  . [DISCONTINUED] lactulose (CHRONULAC) 10 GM/15ML solution Take 10 g by mouth 4 (four) times daily.  . [DISCONTINUED] levalbuterol (XOPENEX) 0.63 MG/3ML nebulizer solution Take 0.63 mg by nebulization 2 (two) times daily.   . [DISCONTINUED] Melatonin 3 MG TABS Take 3 mg by mouth at bedtime.  . [DISCONTINUED] Multiple Vitamin (MULTIVITAMIN WITH MINERALS) TABS tablet Take 1 tablet by mouth daily.   . [DISCONTINUED] oxyCODONE (OXY IR/ROXICODONE) 5 MG immediate release tablet Take 1 tablet (5 mg total) by mouth every 6 (six) hours as needed for moderate pain or severe pain.  . [DISCONTINUED] potassium chloride SA (K-DUR,KLOR-CON) 20 MEQ tablet Take 20 mEq by mouth daily.  . [DISCONTINUED] QUEtiapine (SEROQUEL) 100 MG tablet Take 100 mg by mouth at bedtime.  . [DISCONTINUED] rifaximin (XIFAXAN) 550 MG TABS tablet Take 550 mg by mouth 2 (two) times daily.  . [DISCONTINUED] rOPINIRole (REQUIP) 4 MG tablet Take 4 mg by mouth at bedtime.  . [DISCONTINUED] torsemide (DEMADEX) 20 MG tablet Take 40 mg by mouth daily.  . [DISCONTINUED] traMADol (ULTRAM) 50 MG tablet Take 1 tablet (50 mg total) by mouth every 12 (twelve) hours as needed for moderate pain. Not to be given with Xanax.  . [DISCONTINUED] tranexamic acid  (LYSTEDA) 650 MG TABS tablet Take 2 tablets (1,300 mg total) by mouth 2 (two) times daily.   No facility-administered encounter medications on file as of 10/11/17.     Review of Systems  Unable to obtain due to confusion    Immunization History  Administered Date(s) Administered  . H1N1 09/06/2008  . Influenza Split 07/30/2011, 05/26/2012  . Influenza Whole 05/19/2008, 06/22/2009, 06/08/2010  . Influenza,inj,Quad PF,6+ Mos 06/10/2013, 05/05/2014, 05/12/2015  . Influenza-Unspecified 12/24/2013, 05/26/2017  . Pneumococcal Conjugate-13 12/19/2014  . Pneumococcal Polysaccharide-23 08/27/2007  . Pneumococcal-Unspecified 07/26/2014   Pertinent  Health Maintenance Due  Topic Date Due  . DEXA SCAN  07/18/2008  . URINE MICROALBUMIN  07/16/2016  . HEMOGLOBIN A1C  09/01/2016  . FOOT EXAM  01/09/2017  . OPHTHALMOLOGY EXAM  02/21/2017  . MAMMOGRAM  04/24/2017  . COLONOSCOPY  06/08/2017  . INFLUENZA VACCINE  Completed  . PNA vac Low Risk Adult  Completed   Fall Risk  09/07/2015 07/28/2015 04/03/2015 01/05/2015 12/19/2014  Falls in the past year? Yes Yes Yes Yes Yes  Number falls in past yr: 2 or more 2 or more 2 or more 2 or more 2 or more  Injury with Fall? Yes No Yes Yes Yes  Risk Factor Category  High Fall Risk High Fall Risk High Fall Risk High Fall Risk High Fall Risk  Risk for fall due to : History of fall(s);Impaired balance/gait;Impaired mobility;Medication side effect;Mental status change History of fall(s);Impaired balance/gait;Impaired mobility;Medication side effect;Mental status change History of fall(s);Impaired balance/gait;Impaired mobility;Medication side effect History of fall(s);Impaired balance/gait Impaired balance/gait;Impaired mobility;History of fall(s)  Risk for fall due to: Comment - - Pt is getting PT/OT and this is helping with strengthening and balance. Pt exercising on other days also. - -  Follow up Falls evaluation completed;Education provided;Falls prevention  discussed Falls evaluation completed;Education provided;Falls prevention discussed Falls evaluation completed;Education provided;Falls prevention discussed Falls evaluation completed;Education provided;Falls prevention discussed -      Vitals:   10-09-2017 1120  BP: 140/70  Pulse: 76  Resp: 20  Temp: (!) 97.3 F (36.3 C)  TempSrc: Oral  SpO2: 94%  Weight: 198 lb 8 oz (90 kg)  Height: 5' 2.5" (1.588 m)   Body mass index is 35.73 kg/m.  Physical Exam  GENERAL APPEARANCE: Well nourished. Obese SKIN:  Skin is warm and dry.  MOUTH and THROAT: Lips are without lesions.  RESPIRATORY: Rales noted on bilateral lung fields, O2 @ 4L/min via [place] CARDIAC: RRR, no murmur,no extra heart sounds, no edema GI: Abdomen soft, normal BS EXTREMITIES:  Able to move X 4 extremities PSYCHIATRIC: Alert to self. Agitated   Labs reviewed: Recent Labs    06/13/17 1826 06/14/17 0546  08/12/17 0943 08/13/17 0633 08/25/17 09/08/17 0752  NA 139 141   < > 143 142 144  144 144  K 3.2* 3.7   < > 3.8 3.8 3.4  3.4 3.8  CL 100* 105   < > 113* 112*  --  107  CO2 28 31   < > 25 25  --  27  GLUCOSE 140* 79   < > 199* 199*  --  93  BUN 29* 26*   < > 40* 39* 36*  36* 39*  CREATININE 1.17* 1.06*   < > 1.33* 1.34* 1.5*  1.5* 1.50*  CALCIUM 8.4* 8.4*   < > 8.0* 8.2*  --  8.0*  MG 2.1 2.2  --   --   --   --   --   PHOS 4.0 4.1  --   --   --   --   --    < > = values in this interval not displayed.   Recent Labs    08/01/17 0351 08/11/17 1414 08/25/17 09/08/17 0752  AST 33  34 37*  37* 32  ALT 23 25 26  26 23   ALKPHOS 89 109 118  118 107  BILITOT 1.2 0.80  --  0.6  PROT 4.6* 4.8*  --  4.6*  ALBUMIN 2.4* 2.5*  --  2.5*   Recent Labs    08/13/17 0175 08/21/17 08/25/17 08/25/17 1153 09/08/17 0752  WBC 3.0* 4.4 2.5  2.5 3.9 3.3*  NEUTROABS 2.2 3 2  2  3.1 2.3  HGB 8.6* 8.5* 6.8*  6.8* 7.4*  --   HCT 25.3* 25* 20*  20* 23.4* 23.3*  MCV 95.5  --   --  99.6 102.2*  PLT 55* 104* 73*  73* 78*  75*   Lab Results  Component Value Date   TSH 2.131 06/14/2017   Lab Results  Component Value Date   HGBA1C 5.6 03/01/2016   Lab Results  Component Value Date   CHOL 115 12/13/2014   HDL 40.50 12/13/2014   LDLCALC 52 12/13/2014   TRIG 113.0 12/13/2014   CHOLHDL 3 12/13/2014    Assessment/Plan  1. GAVE (gastric antral vascular ectasia) - she had multiple hospitalizations/ blood transfusions and has decided to be on comfort care/hospice, continue Pantoprazole 20 mg 1 tab BID, Dicyclomine 20 mg 1 tab twice a day, Carafate 1 gm/10 ml Q 6 hours PRN   2. Other chronic pain - currently on Morphine PRN, Tramadol 50 mg daily   3. Anxiety -  discontinue Xanax PRN, start Xanax 1 mg 1 tab Q 6 hours   4. Psychosis, unspecified psychosis type ([place]) - Haldol 5 mg was given but she kept on rocking her head and torso, will start patient on Thorazine 50 mg Q 6 hours PRN  5. Mood disorder (H[facility]) - continue Valproic acid 250 mg 1 capsule daily   6. COPD with chronic bronchitis ([place]) - continue albuterol neb PRN, Guaifenesin 400 mg 1 tab Q 4 hours PRN, and O2 @ 4L/min via [place] continuously, Ipratropium-albuterol neb TIDdiscontinue Advair   7. Dry eyes  - continue Systane balane 0.6% eye drops 1 gtt to both eyes daily   8. Nausea - given Phenergan to 25 mg 1 tab PO X 1  And start Compazine 10 mg TID PRN  9. Anemia due to chronic blood loss -  Has decided not to have anymore labs, blood transfusions nor hospitalizations  10. Chronic diastolic CHF - patient has poor oral intake, will discontinue Torsemide  11.  Insulin dependent diabetes mellitus - has poor oral intake and does not want CBGs to be done, will discontinue Lantus    Family/ staff Communication: Discussed plan of care with resident, family, charge nurse and hospice nurse.  Labs/tests ordered:  None  Goals of care:   Long-term care/hospice   Durenda Age, NP [facility] [phone]  (Monday-Friday 8:00 a.m. - 5:00 p.m.) [phone] (after hours)

## 2017-09-26 DEATH — deceased

## 2017-10-06 ENCOUNTER — Other Ambulatory Visit: Payer: Medicare Other

## 2017-10-06 ENCOUNTER — Ambulatory Visit: Payer: Medicare Other | Admitting: Hematology

## 2017-10-20 ENCOUNTER — Other Ambulatory Visit: Payer: Medicare Other

## 2017-11-03 ENCOUNTER — Other Ambulatory Visit: Payer: Medicare Other
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
## 2018-12-28 ENCOUNTER — Other Ambulatory Visit: Payer: Self-pay | Admitting: *Deleted
